# Patient Record
Sex: Female | Born: 1970 | Race: White | Hispanic: No | Smoking: Current every day smoker
Health system: Southern US, Community
[De-identification: ages and names within clinical notes are randomized; demographics above are authoritative.]

## PROBLEM LIST (undated history)

## (undated) DIAGNOSIS — Z22322 Carrier or suspected carrier of Methicillin resistant Staphylococcus aureus: Secondary | ICD-10-CM

## (undated) DIAGNOSIS — J189 Pneumonia, unspecified organism: Secondary | ICD-10-CM

## (undated) DIAGNOSIS — I10 Essential (primary) hypertension: Secondary | ICD-10-CM

## (undated) DIAGNOSIS — I2510 Atherosclerotic heart disease of native coronary artery without angina pectoris: Secondary | ICD-10-CM

## (undated) DIAGNOSIS — I82409 Acute embolism and thrombosis of unspecified deep veins of unspecified lower extremity: Secondary | ICD-10-CM

## (undated) DIAGNOSIS — Z9981 Dependence on supplemental oxygen: Secondary | ICD-10-CM

## (undated) DIAGNOSIS — R0602 Shortness of breath: Secondary | ICD-10-CM

## (undated) DIAGNOSIS — K219 Gastro-esophageal reflux disease without esophagitis: Secondary | ICD-10-CM

## (undated) DIAGNOSIS — T4145XA Adverse effect of unspecified anesthetic, initial encounter: Secondary | ICD-10-CM

## (undated) DIAGNOSIS — A4902 Methicillin resistant Staphylococcus aureus infection, unspecified site: Secondary | ICD-10-CM

## (undated) DIAGNOSIS — I219 Acute myocardial infarction, unspecified: Secondary | ICD-10-CM

## (undated) DIAGNOSIS — F419 Anxiety disorder, unspecified: Secondary | ICD-10-CM

## (undated) DIAGNOSIS — Z9889 Other specified postprocedural states: Secondary | ICD-10-CM

## (undated) DIAGNOSIS — I2699 Other pulmonary embolism without acute cor pulmonale: Secondary | ICD-10-CM

## (undated) DIAGNOSIS — F32A Depression, unspecified: Secondary | ICD-10-CM

## (undated) DIAGNOSIS — Z91158 Patient's noncompliance with renal dialysis for other reason: Secondary | ICD-10-CM

## (undated) DIAGNOSIS — F329 Major depressive disorder, single episode, unspecified: Secondary | ICD-10-CM

## (undated) DIAGNOSIS — T8859XA Other complications of anesthesia, initial encounter: Secondary | ICD-10-CM

## (undated) DIAGNOSIS — I739 Peripheral vascular disease, unspecified: Secondary | ICD-10-CM

## (undated) DIAGNOSIS — F319 Bipolar disorder, unspecified: Secondary | ICD-10-CM

## (undated) DIAGNOSIS — R112 Nausea with vomiting, unspecified: Secondary | ICD-10-CM

## (undated) DIAGNOSIS — Z9115 Patient's noncompliance with renal dialysis: Secondary | ICD-10-CM

## (undated) DIAGNOSIS — K3184 Gastroparesis: Secondary | ICD-10-CM

## (undated) DIAGNOSIS — J069 Acute upper respiratory infection, unspecified: Secondary | ICD-10-CM

## (undated) DIAGNOSIS — E785 Hyperlipidemia, unspecified: Secondary | ICD-10-CM

## (undated) DIAGNOSIS — Z992 Dependence on renal dialysis: Secondary | ICD-10-CM

## (undated) DIAGNOSIS — L0390 Cellulitis, unspecified: Secondary | ICD-10-CM

## (undated) DIAGNOSIS — N19 Unspecified kidney failure: Secondary | ICD-10-CM

## (undated) DIAGNOSIS — D649 Anemia, unspecified: Secondary | ICD-10-CM

## (undated) DIAGNOSIS — G4730 Sleep apnea, unspecified: Secondary | ICD-10-CM

## (undated) HISTORY — DX: Atherosclerotic heart disease of native coronary artery without angina pectoris: I25.10

## (undated) HISTORY — DX: Anemia, unspecified: D64.9

## (undated) HISTORY — PX: INCISE AND DRAIN ABCESS: PRO64

## (undated) HISTORY — DX: Other pulmonary embolism without acute cor pulmonale: I26.99

## (undated) HISTORY — PX: CHOLECYSTECTOMY: SHX55

## (undated) HISTORY — DX: Anxiety disorder, unspecified: F41.9

## (undated) HISTORY — DX: Hyperlipidemia, unspecified: E78.5

## (undated) HISTORY — DX: Cellulitis, unspecified: L03.90

## (undated) HISTORY — DX: Carrier or suspected carrier of methicillin resistant Staphylococcus aureus: Z22.322

## (undated) HISTORY — PX: EYE SURGERY: SHX253

## (undated) HISTORY — DX: Bipolar disorder, unspecified: F31.9

## (undated) HISTORY — PX: OTHER SURGICAL HISTORY: SHX169

## (undated) HISTORY — DX: Depression, unspecified: F32.A

## (undated) HISTORY — DX: Unspecified kidney failure: N19

## (undated) HISTORY — PX: CARDIAC CATHETERIZATION: SHX172

## (undated) HISTORY — DX: Acute embolism and thrombosis of unspecified deep veins of unspecified lower extremity: I82.409

## (undated) HISTORY — PX: BACK SURGERY: SHX140

## (undated) HISTORY — DX: Major depressive disorder, single episode, unspecified: F32.9

## (undated) HISTORY — DX: Gastroparesis: K31.84

## (undated) HISTORY — DX: Dependence on renal dialysis: Z99.2

---

## 1998-08-27 ENCOUNTER — Emergency Department (HOSPITAL_COMMUNITY): Admission: EM | Admit: 1998-08-27 | Discharge: 1998-08-27 | Payer: Self-pay | Admitting: Emergency Medicine

## 2000-01-25 ENCOUNTER — Encounter: Admission: RE | Admit: 2000-01-25 | Discharge: 2000-01-25 | Payer: Self-pay | Admitting: *Deleted

## 2000-04-12 ENCOUNTER — Ambulatory Visit (HOSPITAL_COMMUNITY): Admission: RE | Admit: 2000-04-12 | Discharge: 2000-04-12 | Payer: Self-pay | Admitting: Neurosurgery

## 2000-04-23 ENCOUNTER — Encounter: Payer: Self-pay | Admitting: Neurosurgery

## 2000-04-23 ENCOUNTER — Ambulatory Visit (HOSPITAL_COMMUNITY): Admission: RE | Admit: 2000-04-23 | Discharge: 2000-04-23 | Payer: Self-pay | Admitting: Neurosurgery

## 2000-05-15 ENCOUNTER — Encounter: Payer: Self-pay | Admitting: Neurosurgery

## 2000-05-17 ENCOUNTER — Encounter: Payer: Self-pay | Admitting: Neurosurgery

## 2000-05-17 ENCOUNTER — Inpatient Hospital Stay (HOSPITAL_COMMUNITY): Admission: RE | Admit: 2000-05-17 | Discharge: 2000-05-20 | Payer: Self-pay | Admitting: Neurosurgery

## 2000-05-21 ENCOUNTER — Emergency Department (HOSPITAL_COMMUNITY): Admission: EM | Admit: 2000-05-21 | Discharge: 2000-05-21 | Payer: Self-pay | Admitting: Emergency Medicine

## 2000-06-06 ENCOUNTER — Ambulatory Visit (HOSPITAL_COMMUNITY): Admission: RE | Admit: 2000-06-06 | Discharge: 2000-06-06 | Payer: Self-pay | Admitting: Neurosurgery

## 2000-06-12 ENCOUNTER — Encounter: Admission: RE | Admit: 2000-06-12 | Discharge: 2000-06-12 | Payer: Self-pay | Admitting: Neurosurgery

## 2000-06-12 ENCOUNTER — Encounter: Payer: Self-pay | Admitting: Neurosurgery

## 2000-07-15 ENCOUNTER — Encounter: Admission: RE | Admit: 2000-07-15 | Discharge: 2000-07-15 | Payer: Self-pay | Admitting: Neurosurgery

## 2000-07-15 ENCOUNTER — Encounter: Payer: Self-pay | Admitting: Neurosurgery

## 2000-09-02 ENCOUNTER — Encounter: Admission: RE | Admit: 2000-09-02 | Discharge: 2000-09-02 | Payer: Self-pay | Admitting: Neurosurgery

## 2000-09-02 ENCOUNTER — Encounter: Payer: Self-pay | Admitting: Neurosurgery

## 2001-04-09 ENCOUNTER — Encounter: Payer: Self-pay | Admitting: Neurological Surgery

## 2001-04-09 ENCOUNTER — Ambulatory Visit (HOSPITAL_COMMUNITY): Admission: RE | Admit: 2001-04-09 | Discharge: 2001-04-09 | Payer: Self-pay | Admitting: Neurological Surgery

## 2001-04-15 ENCOUNTER — Encounter: Payer: Self-pay | Admitting: Neurological Surgery

## 2001-04-15 ENCOUNTER — Encounter: Admission: RE | Admit: 2001-04-15 | Discharge: 2001-04-15 | Payer: Self-pay | Admitting: Neurological Surgery

## 2001-04-27 ENCOUNTER — Emergency Department (HOSPITAL_COMMUNITY): Admission: EM | Admit: 2001-04-27 | Discharge: 2001-04-28 | Payer: Self-pay | Admitting: *Deleted

## 2001-07-06 ENCOUNTER — Emergency Department (HOSPITAL_COMMUNITY): Admission: EM | Admit: 2001-07-06 | Discharge: 2001-07-06 | Payer: Self-pay | Admitting: *Deleted

## 2001-07-08 ENCOUNTER — Emergency Department (HOSPITAL_COMMUNITY): Admission: EM | Admit: 2001-07-08 | Discharge: 2001-07-08 | Payer: Self-pay | Admitting: Internal Medicine

## 2001-07-08 ENCOUNTER — Encounter: Payer: Self-pay | Admitting: Internal Medicine

## 2001-07-10 ENCOUNTER — Encounter: Payer: Self-pay | Admitting: Family Medicine

## 2001-07-10 ENCOUNTER — Ambulatory Visit (HOSPITAL_COMMUNITY): Admission: RE | Admit: 2001-07-10 | Discharge: 2001-07-10 | Payer: Self-pay | Admitting: Family Medicine

## 2001-07-16 ENCOUNTER — Encounter: Admission: RE | Admit: 2001-07-16 | Discharge: 2001-07-16 | Payer: Self-pay | Admitting: Neurological Surgery

## 2001-07-16 ENCOUNTER — Encounter: Payer: Self-pay | Admitting: Neurological Surgery

## 2001-08-26 ENCOUNTER — Encounter: Payer: Self-pay | Admitting: Emergency Medicine

## 2001-08-26 ENCOUNTER — Emergency Department (HOSPITAL_COMMUNITY): Admission: EM | Admit: 2001-08-26 | Discharge: 2001-08-26 | Payer: Self-pay | Admitting: Emergency Medicine

## 2001-08-27 HISTORY — PX: PORTACATH PLACEMENT: SHX2246

## 2001-09-06 ENCOUNTER — Encounter: Payer: Self-pay | Admitting: Emergency Medicine

## 2001-09-06 ENCOUNTER — Inpatient Hospital Stay (HOSPITAL_COMMUNITY): Admission: EM | Admit: 2001-09-06 | Discharge: 2001-09-08 | Payer: Self-pay | Admitting: Emergency Medicine

## 2001-10-01 ENCOUNTER — Emergency Department (HOSPITAL_COMMUNITY): Admission: EM | Admit: 2001-10-01 | Discharge: 2001-10-01 | Payer: Self-pay | Admitting: *Deleted

## 2001-11-02 ENCOUNTER — Encounter: Payer: Self-pay | Admitting: Emergency Medicine

## 2001-11-02 ENCOUNTER — Emergency Department (HOSPITAL_COMMUNITY): Admission: EM | Admit: 2001-11-02 | Discharge: 2001-11-02 | Payer: Self-pay | Admitting: Emergency Medicine

## 2001-11-27 ENCOUNTER — Encounter: Payer: Self-pay | Admitting: Emergency Medicine

## 2001-11-27 ENCOUNTER — Emergency Department (HOSPITAL_COMMUNITY): Admission: EM | Admit: 2001-11-27 | Discharge: 2001-11-27 | Payer: Self-pay | Admitting: Emergency Medicine

## 2002-01-10 ENCOUNTER — Encounter: Payer: Self-pay | Admitting: *Deleted

## 2002-01-10 ENCOUNTER — Emergency Department (HOSPITAL_COMMUNITY): Admission: EM | Admit: 2002-01-10 | Discharge: 2002-01-10 | Payer: Self-pay | Admitting: *Deleted

## 2002-01-12 ENCOUNTER — Emergency Department (HOSPITAL_COMMUNITY): Admission: EM | Admit: 2002-01-12 | Discharge: 2002-01-12 | Payer: Self-pay | Admitting: Emergency Medicine

## 2002-01-15 ENCOUNTER — Emergency Department (HOSPITAL_COMMUNITY): Admission: EM | Admit: 2002-01-15 | Discharge: 2002-01-15 | Payer: Self-pay | Admitting: Emergency Medicine

## 2002-01-29 ENCOUNTER — Emergency Department (HOSPITAL_COMMUNITY): Admission: EM | Admit: 2002-01-29 | Discharge: 2002-01-29 | Payer: Self-pay | Admitting: *Deleted

## 2002-02-18 ENCOUNTER — Ambulatory Visit (HOSPITAL_COMMUNITY): Admission: RE | Admit: 2002-02-18 | Discharge: 2002-02-18 | Payer: Self-pay | Admitting: General Surgery

## 2002-03-15 ENCOUNTER — Encounter: Payer: Self-pay | Admitting: Emergency Medicine

## 2002-03-15 ENCOUNTER — Inpatient Hospital Stay (HOSPITAL_COMMUNITY): Admission: EM | Admit: 2002-03-15 | Discharge: 2002-03-21 | Payer: Self-pay | Admitting: Emergency Medicine

## 2002-03-27 ENCOUNTER — Encounter: Payer: Self-pay | Admitting: *Deleted

## 2002-03-27 ENCOUNTER — Emergency Department (HOSPITAL_COMMUNITY): Admission: EM | Admit: 2002-03-27 | Discharge: 2002-03-27 | Payer: Self-pay | Admitting: *Deleted

## 2002-04-09 ENCOUNTER — Inpatient Hospital Stay (HOSPITAL_COMMUNITY): Admission: AD | Admit: 2002-04-09 | Discharge: 2002-04-14 | Payer: Self-pay | Admitting: General Surgery

## 2002-05-14 ENCOUNTER — Emergency Department (HOSPITAL_COMMUNITY): Admission: EM | Admit: 2002-05-14 | Discharge: 2002-05-15 | Payer: Self-pay | Admitting: *Deleted

## 2002-05-14 ENCOUNTER — Encounter: Payer: Self-pay | Admitting: *Deleted

## 2003-05-12 IMAGING — CT CT PELVIS W/ CM
1 of 3 series · 14 of 32 positions shown, 19 images · IV contrast (CONTRAST)
Comparison: none

FINDINGS
CLINICAL DATA: VOMITING.  EPIGASTRIC PAIN.
CT OF THE ABDOMEN WITH IV CONTRAST
SCANS WERE OBTAINED DURING IV CONTRAST ENHANCEMENT WITH 100 CC OF OMNIPAQUE 300 INJECTED AT 2 CC
PER SECOND.
THE LIVER IS DIFFUSELY HYPODENSE CONSISTENT WITH FATTY CHANGE.  THERE ARE NO FOCAL HEPATIC
ABNORMALITIES.  THE SPLEEN, PANCREAS, AND KIDNEYS HAVE A NORMAL APPEARANCE.  THE LEFT ADRENAL GLAND
IS ENLARGED AND CONTAINS A 1.2 CM IN SIZE FAT CONTAINING NODULE  (IMAGE SEQUENCE #25) CONSISTENT
WITH A SMALL ADRENAL MYELOLIPOMA.  THERE IS A SECOND NODULE WHICH MEASURES 1.4 CM IN DIAMETER
LOCATED WITHIN THE ANTERIOR PORTION OF THE LEFT ADRENAL GLAND WHICH MEASURES IN THE 45-50
[place] UNIT RANGE AND IS AN INDETERMINATE NODULE.  THE RIGHT ADRENAL GLAND IS SOMEWHAT
PROMINENT IN SIZE WITH SLIGHT NODULARITY WITHIN ITS INFERIOR PORTION.  A DEDICATED CT OF THE
ADRENAL GLANDS WITHOUT IV CONTRAST AND FOLLOWED BY IV CONTRAST IF NEEDED MAY BE HELPFUL FOR FURTHER
EVALUATION OF THESE LESIONS.
THERE HAS BEEN A PREVIOUS LUMBAR FUSION.  THE ABDOMINAL AORTA IS NORMAL IN SIZE.  THE APPENDIX IS
VISUALIZED AND HAS A NORMAL APPEARANCE.
IMPRESSION
1.   TWO LEFT ADRENAL NODULES.  ONE OF THE NODULES IS 1.2 CM IN SIZE AND IS CONSISTENT WITH A
BENIGN MYELOLIPOMA.   THE SECOND NODULE IS 1.4 CM IN SIZE AND IS INDETERMINATE.  THERE IS ALSO
QUESTIONABLE SLIGHT NODULARITY ASSOCIATED WITH THE RIGHT ADRENAL GLAND AS DISCUSSED ABOVE.  AS A
RESULT THIN SECTION CT OF THE ADRENALS WITHOUT AND WITH IV CONTRAST IF NEEDED MAY BE HELPFUL IN
FURTHER EVALUATION.
2.   DIFFUSE FATTY CHANGES NOTED WITHIN THE LIVER.  OTHERWISE NEGATIVE CT OF THE ABDOMEN.
CT OF THE PELVIS WITH IV CONTRAST
THERE IS NO EVIDENCE FOR PELVIC MASS OR ADENOPATHY AND THERE IS NO FREE PELVIC FLUID.  THE APPENDIX
IS VISUALIZED AND HAS A NORMAL APPEARANCE.
NEGATIVE CT OF THE PELVIS.

[Series 3682: — · axial · 0.65mm/px · z∈[+1312,+1752]mm · 14 of 100 slices shown, 19 images]
[im 6/100  soft-tissue]
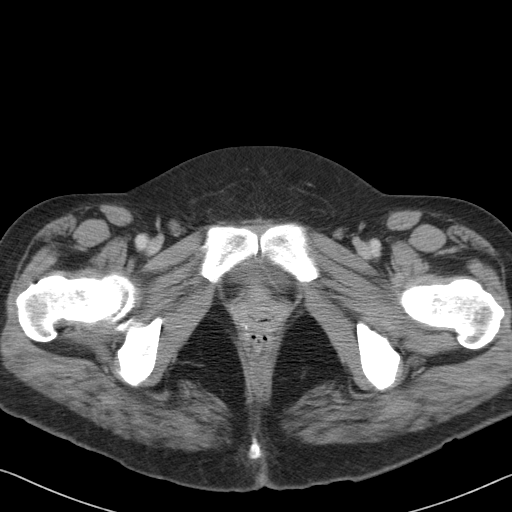
[im 6/100  bone]
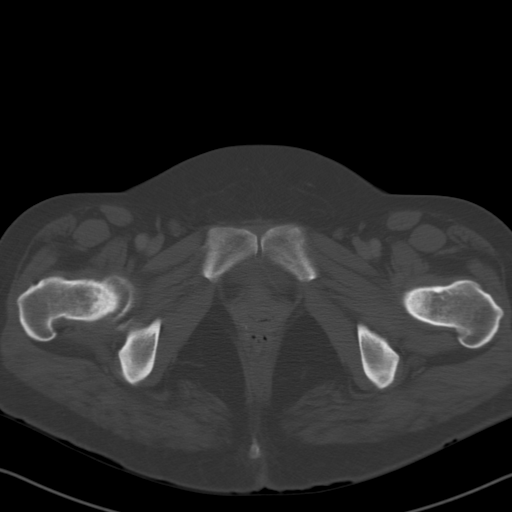
[im 16/100  soft-tissue]
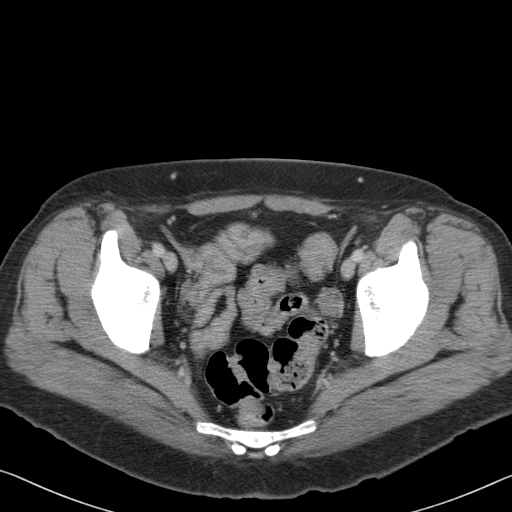
[im 21/100  soft-tissue]
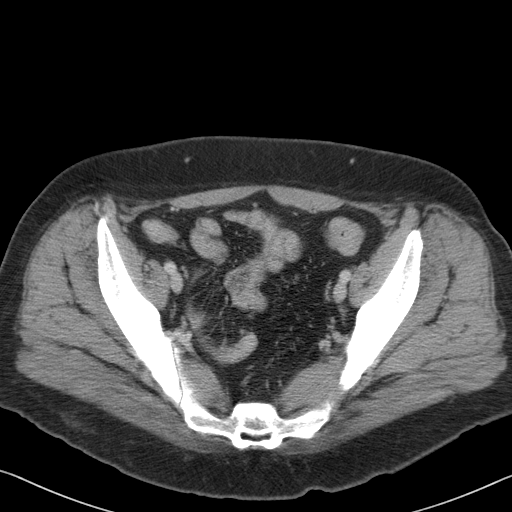
[im 27/100  soft-tissue]
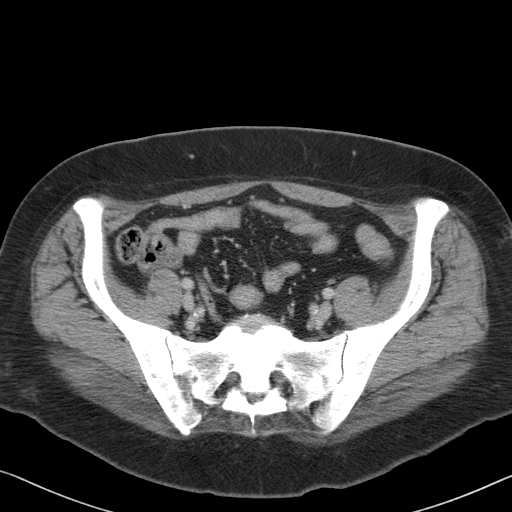
[im 37/100  soft-tissue]
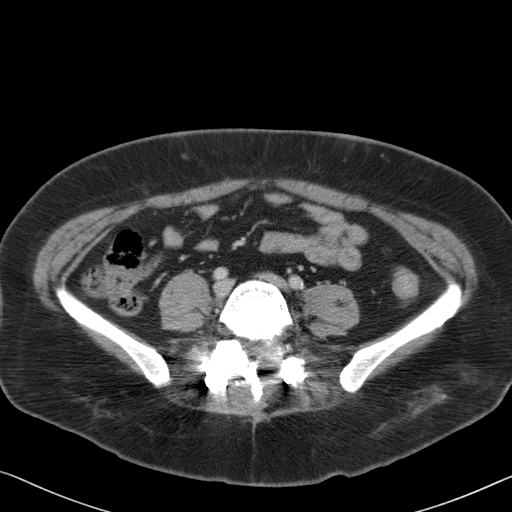
[im 42/100  soft-tissue]
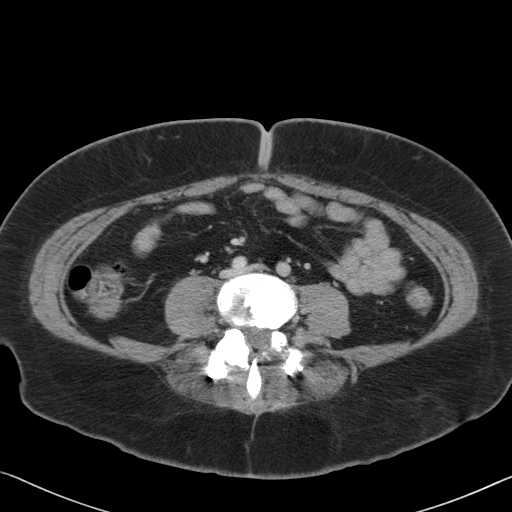
[im 53/100  soft-tissue]
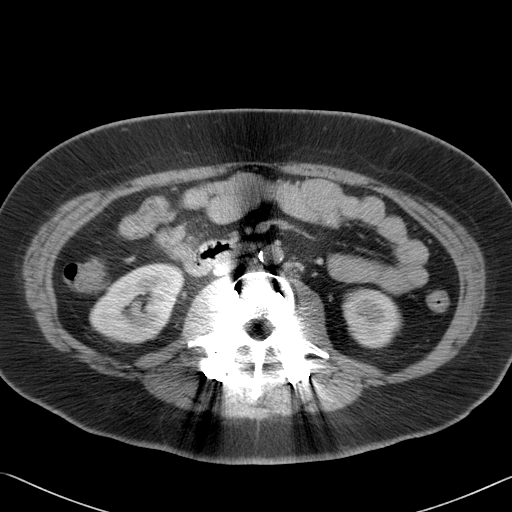
[im 58/100  soft-tissue]
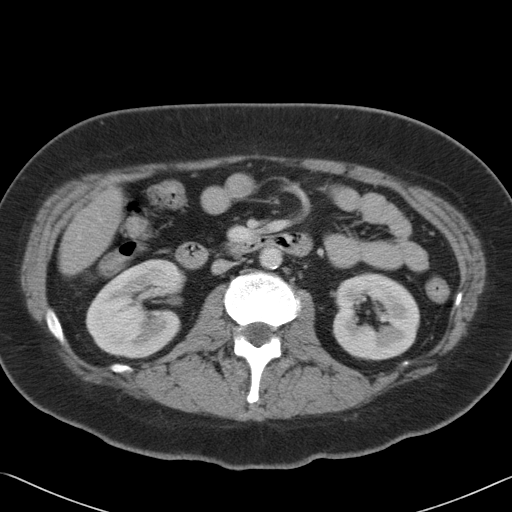
[im 63/100  soft-tissue]
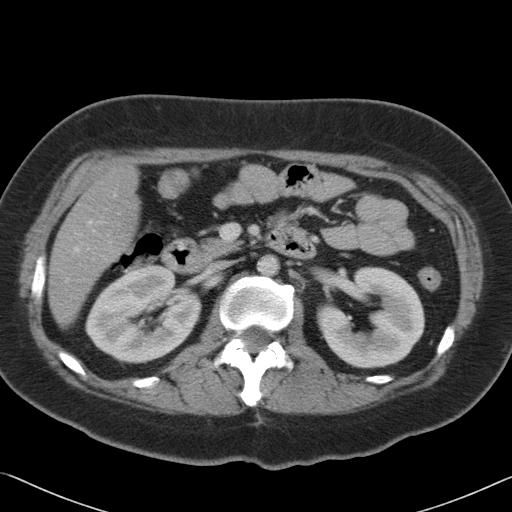
[im 63/100  bone]
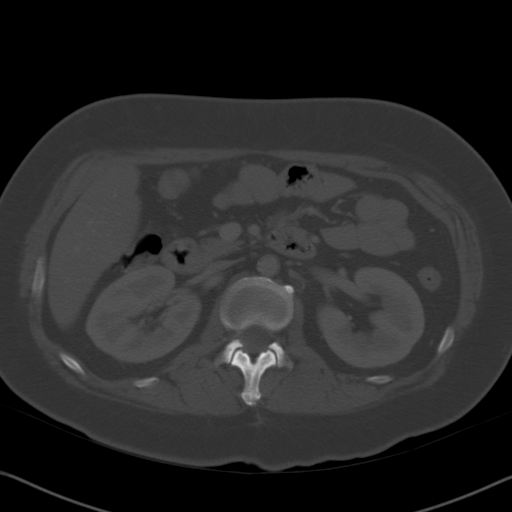
[im 73/100  soft-tissue]
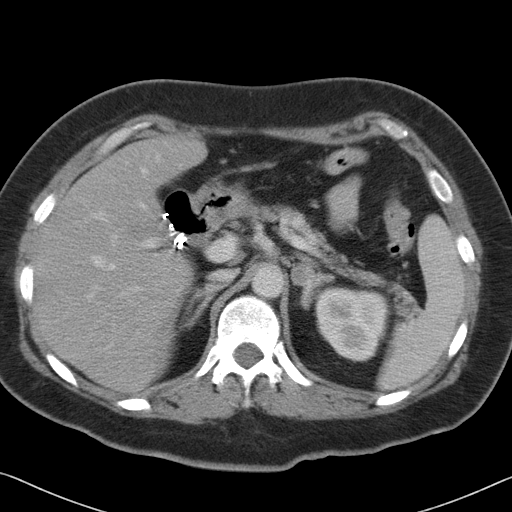
[im 79/100  soft-tissue]
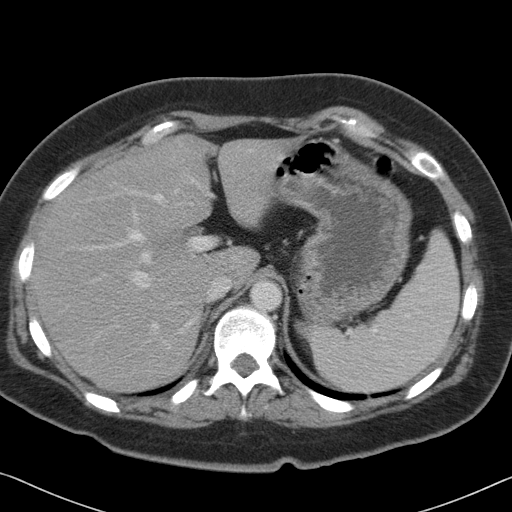
[im 79/100  lung]
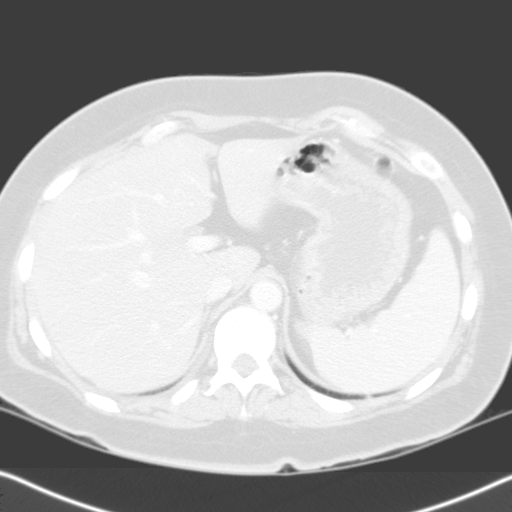
[im 84/100  soft-tissue]
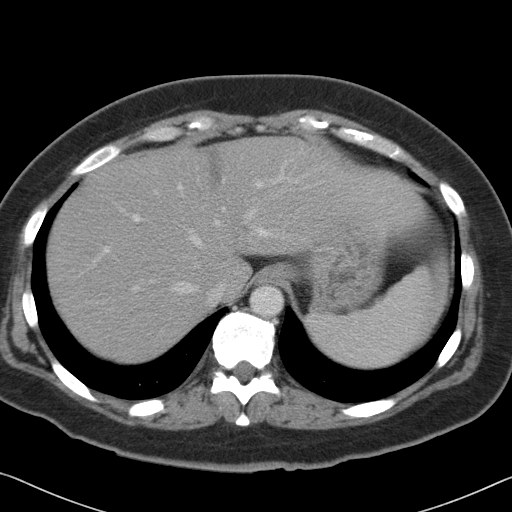
[im 84/100  lung]
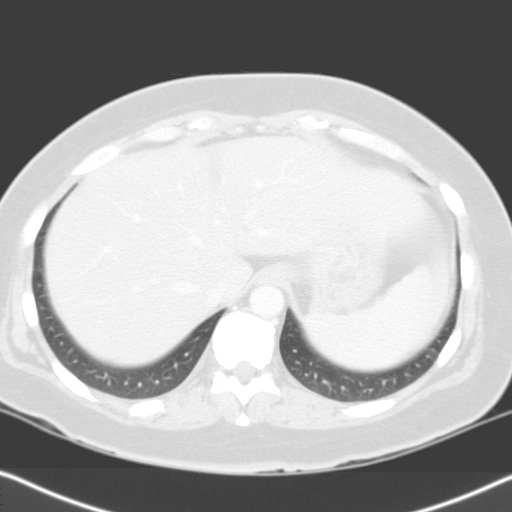
[im 89/100  lung]
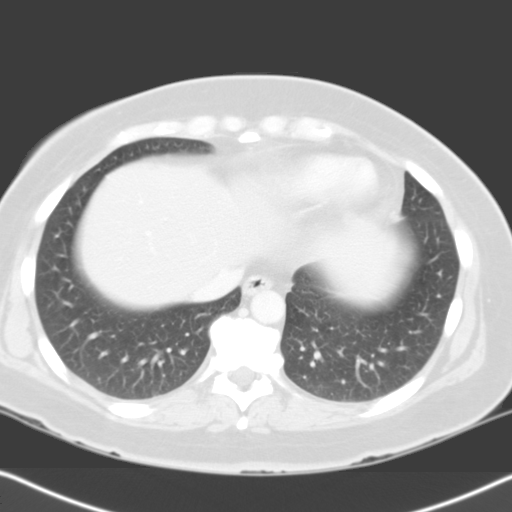
[im 94/100  soft-tissue]
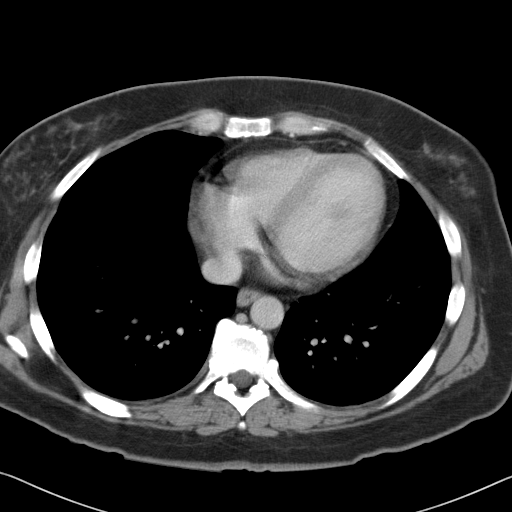
[im 94/100  lung]
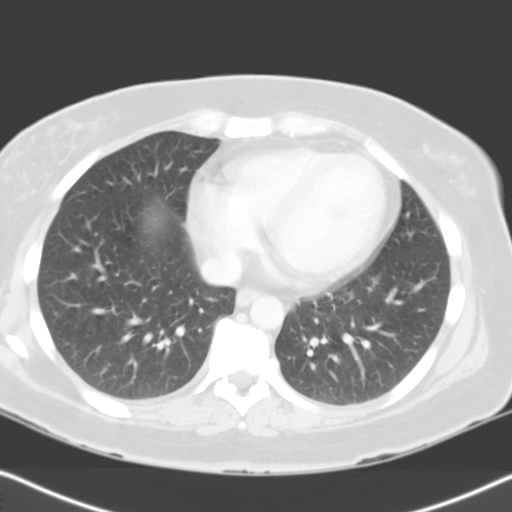

[14 of 32 positions shown; findings below may reference images not displayed]

## 2003-07-09 ENCOUNTER — Encounter (HOSPITAL_COMMUNITY): Admission: RE | Admit: 2003-07-09 | Discharge: 2003-08-08 | Payer: Self-pay | Admitting: Internal Medicine

## 2003-10-05 ENCOUNTER — Inpatient Hospital Stay (HOSPITAL_COMMUNITY): Admission: EM | Admit: 2003-10-05 | Discharge: 2003-10-07 | Payer: Self-pay | Admitting: Emergency Medicine

## 2003-10-08 ENCOUNTER — Emergency Department (HOSPITAL_COMMUNITY): Admission: EM | Admit: 2003-10-08 | Discharge: 2003-10-08 | Payer: Self-pay | Admitting: Emergency Medicine

## 2003-10-11 ENCOUNTER — Encounter: Admission: RE | Admit: 2003-10-11 | Discharge: 2003-10-11 | Payer: Self-pay | Admitting: Neurosurgery

## 2003-11-24 ENCOUNTER — Encounter (HOSPITAL_COMMUNITY): Admission: RE | Admit: 2003-11-24 | Discharge: 2003-12-24 | Payer: Self-pay | Admitting: Internal Medicine

## 2003-12-03 ENCOUNTER — Inpatient Hospital Stay (HOSPITAL_COMMUNITY): Admission: EM | Admit: 2003-12-03 | Discharge: 2003-12-04 | Payer: Self-pay | Admitting: Emergency Medicine

## 2003-12-13 ENCOUNTER — Inpatient Hospital Stay (HOSPITAL_COMMUNITY): Admission: EM | Admit: 2003-12-13 | Discharge: 2003-12-15 | Payer: Self-pay | Admitting: Emergency Medicine

## 2003-12-16 ENCOUNTER — Inpatient Hospital Stay (HOSPITAL_COMMUNITY): Admission: EM | Admit: 2003-12-16 | Discharge: 2003-12-18 | Payer: Self-pay | Admitting: Emergency Medicine

## 2004-01-02 ENCOUNTER — Inpatient Hospital Stay (HOSPITAL_COMMUNITY): Admission: EM | Admit: 2004-01-02 | Discharge: 2004-01-03 | Payer: Self-pay | Admitting: *Deleted

## 2004-01-04 ENCOUNTER — Inpatient Hospital Stay (HOSPITAL_COMMUNITY): Admission: EM | Admit: 2004-01-04 | Discharge: 2004-01-07 | Payer: Self-pay | Admitting: Emergency Medicine

## 2004-02-14 ENCOUNTER — Emergency Department (HOSPITAL_COMMUNITY): Admission: EM | Admit: 2004-02-14 | Discharge: 2004-02-14 | Payer: Self-pay | Admitting: Emergency Medicine

## 2004-03-05 ENCOUNTER — Emergency Department (HOSPITAL_COMMUNITY): Admission: EM | Admit: 2004-03-05 | Discharge: 2004-03-05 | Payer: Self-pay | Admitting: Emergency Medicine

## 2004-03-06 ENCOUNTER — Inpatient Hospital Stay (HOSPITAL_COMMUNITY): Admission: EM | Admit: 2004-03-06 | Discharge: 2004-03-09 | Payer: Self-pay | Admitting: Emergency Medicine

## 2004-05-29 ENCOUNTER — Inpatient Hospital Stay (HOSPITAL_COMMUNITY): Admission: EM | Admit: 2004-05-29 | Discharge: 2004-06-02 | Payer: Self-pay | Admitting: Emergency Medicine

## 2004-06-21 ENCOUNTER — Emergency Department (HOSPITAL_COMMUNITY): Admission: EM | Admit: 2004-06-21 | Discharge: 2004-06-22 | Payer: Self-pay | Admitting: *Deleted

## 2004-06-22 ENCOUNTER — Inpatient Hospital Stay (HOSPITAL_COMMUNITY): Admission: EM | Admit: 2004-06-22 | Discharge: 2004-06-25 | Payer: Self-pay | Admitting: Emergency Medicine

## 2004-06-22 ENCOUNTER — Ambulatory Visit: Payer: Self-pay | Admitting: Internal Medicine

## 2004-07-10 ENCOUNTER — Inpatient Hospital Stay (HOSPITAL_COMMUNITY): Admission: EM | Admit: 2004-07-10 | Discharge: 2004-07-13 | Payer: Self-pay | Admitting: Emergency Medicine

## 2004-07-15 ENCOUNTER — Inpatient Hospital Stay (HOSPITAL_COMMUNITY): Admission: EM | Admit: 2004-07-15 | Discharge: 2004-07-18 | Payer: Self-pay | Admitting: Emergency Medicine

## 2004-07-30 ENCOUNTER — Inpatient Hospital Stay (HOSPITAL_COMMUNITY): Admission: EM | Admit: 2004-07-30 | Discharge: 2004-08-03 | Payer: Self-pay | Admitting: Emergency Medicine

## 2004-08-04 ENCOUNTER — Emergency Department (HOSPITAL_COMMUNITY): Admission: EM | Admit: 2004-08-04 | Discharge: 2004-08-05 | Payer: Self-pay | Admitting: Emergency Medicine

## 2004-08-06 ENCOUNTER — Inpatient Hospital Stay (HOSPITAL_COMMUNITY): Admission: EM | Admit: 2004-08-06 | Discharge: 2004-08-12 | Payer: Self-pay | Admitting: Emergency Medicine

## 2004-08-24 ENCOUNTER — Encounter (HOSPITAL_COMMUNITY): Admission: RE | Admit: 2004-08-24 | Discharge: 2004-09-23 | Payer: Self-pay | Admitting: Oncology

## 2004-08-28 ENCOUNTER — Emergency Department (HOSPITAL_COMMUNITY): Admission: EM | Admit: 2004-08-28 | Discharge: 2004-08-29 | Payer: Self-pay | Admitting: *Deleted

## 2004-08-29 ENCOUNTER — Inpatient Hospital Stay (HOSPITAL_COMMUNITY): Admission: EM | Admit: 2004-08-29 | Discharge: 2004-09-02 | Payer: Self-pay | Admitting: Emergency Medicine

## 2004-09-04 ENCOUNTER — Ambulatory Visit (HOSPITAL_COMMUNITY): Payer: Self-pay | Admitting: Oncology

## 2004-09-06 ENCOUNTER — Inpatient Hospital Stay (HOSPITAL_COMMUNITY): Admission: EM | Admit: 2004-09-06 | Discharge: 2004-09-11 | Payer: Self-pay | Admitting: Emergency Medicine

## 2004-09-17 ENCOUNTER — Inpatient Hospital Stay (HOSPITAL_COMMUNITY): Admission: EM | Admit: 2004-09-17 | Discharge: 2004-09-27 | Payer: Self-pay | Admitting: Emergency Medicine

## 2004-09-17 ENCOUNTER — Ambulatory Visit: Payer: Self-pay | Admitting: Internal Medicine

## 2004-10-03 IMAGING — CT CT ABDOMEN W/ CM
1 of 3 series · 14 of 32 positions shown, 19 images · IV contrast (CONTRAST)
Comparison: none

CLINICAL DATA: Abdominal pain, nausea and vomiting.  
 CT ABDOMEN WITH CONTRAST  
 Multi detector helical CT imaging abdomen and pelvis performed following 150 cc Omnipaque 300.  Oral contrast not administered as patient indicates she is allergic to Gloria Angelica.  Non-ionic intravenous contrast indicated due to history of   coronary artery disease status post MI.  Comparison 05/14/02.
 Lung bases clear.  Cholecystectomy.  Three nodules identified, left adrenal gland.  Most superior nodule is 13 x 10 mm size (image #25) fatty attenuation, compatible with myelolipoma.  Second nodule is 14 x 16 mm size (image #28), indeterminate, but stable since previous study, suggesting benign etiology.  Tiny nodule noted inferior aspect of lateral limb of the left adrenal gland, 9 x 8 mm size, unchanged.  Additional diffuse nodularity of both adrenal glands, stable.  No focal abnormalities of the liver, spleen, pancreas or kidneys.  Atherosclerotic calcifications aorta.  Prior lumbar surgery with bilateral pedicle screws and bars at four levels.  Bowel loops unremarkable.  
 Tiny splenule noted inferior to the spleen.  
 IMPRESSION
 1.  Multiple left adrenal nodules including myelolipoma and two additional non specific nodules, which are stable, probably benign.  No acute abnormalities.  
 2. Cholecystectomy.
 3.  Atherosclerotic calcifications noted in aorta and iliac vessels, premature for age.  
 CT PELVIS WITH CONTRAST
 Small amount of non specific free pelvic fluid.  Uterus and ovaries unremarkable for age.  Pelvic bowel loops normal in appearance.  Appendix normal.  No mass, adenopathy or free fluid.  Small left pelvic phlebolith, stable.  
 Small amount of non specific free pelvic fluid.  Normal appendix.  No acute abnormalities.

[Series 4897: — · axial · 0.70mm/px · z∈[+1244,+1678]mm · 14 of 99 slices shown, 19 images]
[im 6/99  soft-tissue]
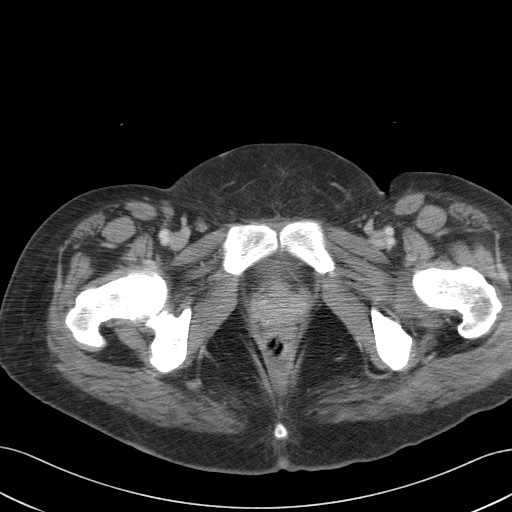
[im 6/99  bone]
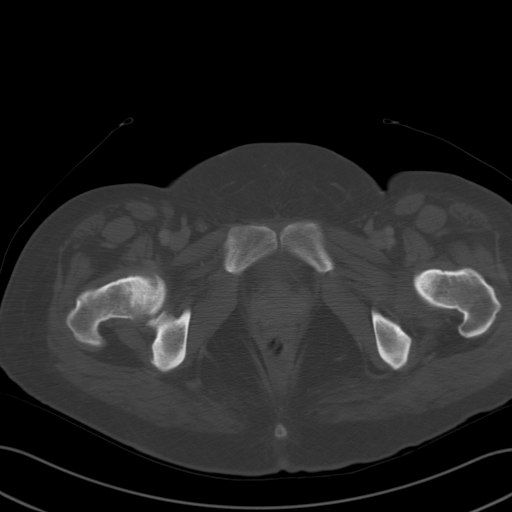
[im 12/99  soft-tissue]
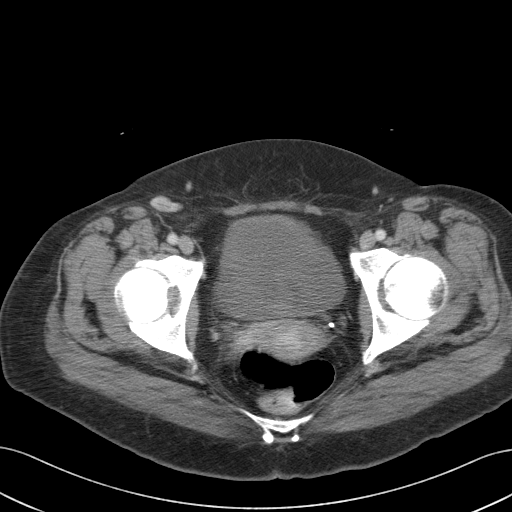
[im 24/99  soft-tissue]
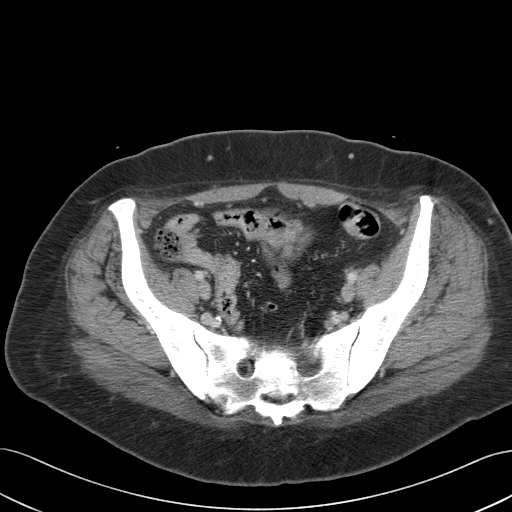
[im 29/99  soft-tissue]
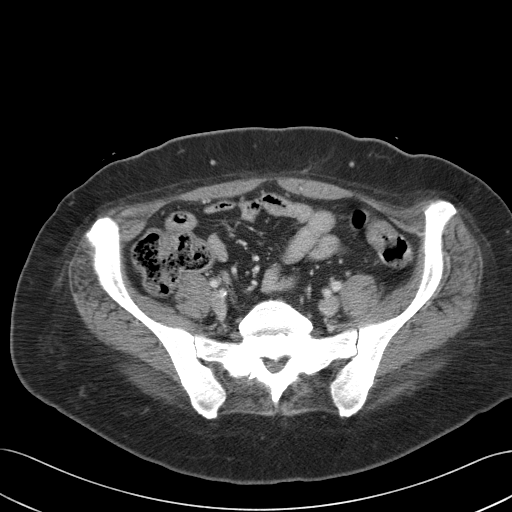
[im 35/99  soft-tissue]
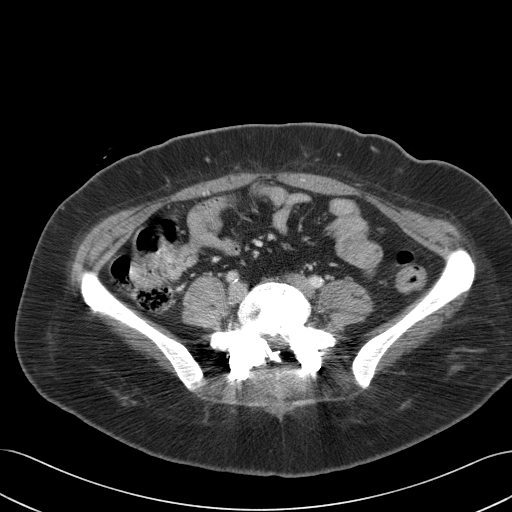
[im 41/99  soft-tissue]
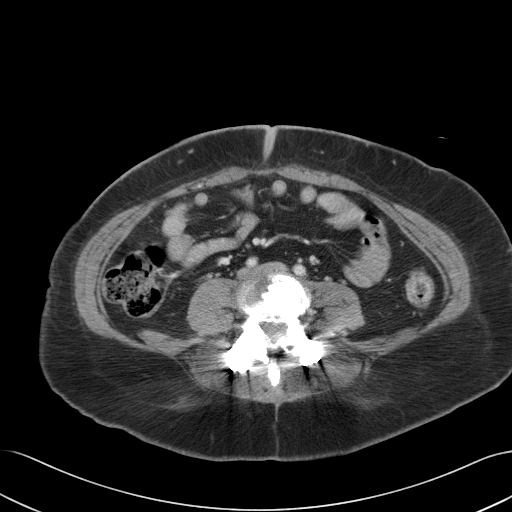
[im 52/99  soft-tissue]
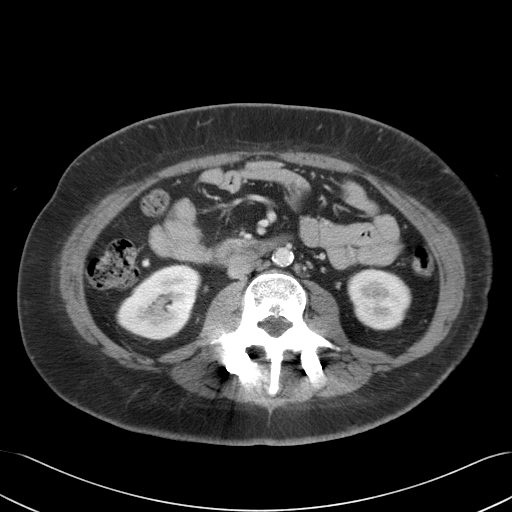
[im 58/99  soft-tissue]
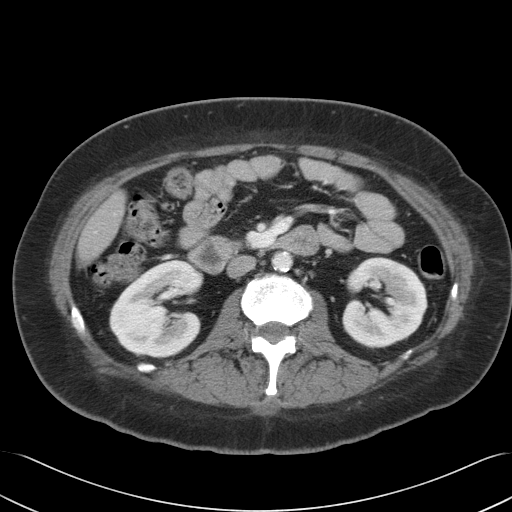
[im 64/99  soft-tissue]
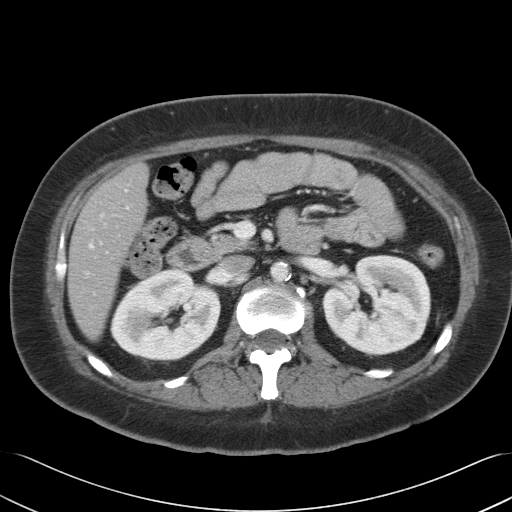
[im 64/99  bone]
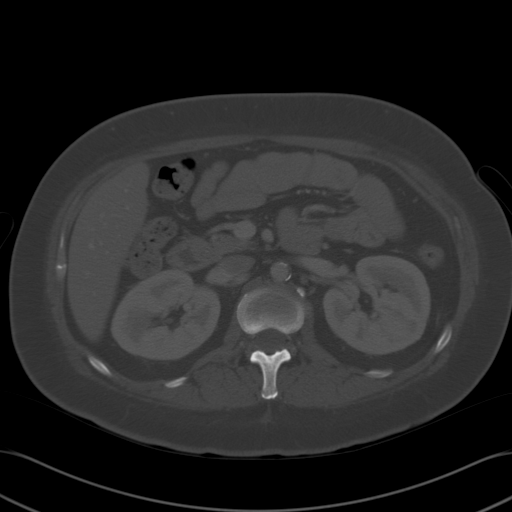
[im 70/99  soft-tissue]
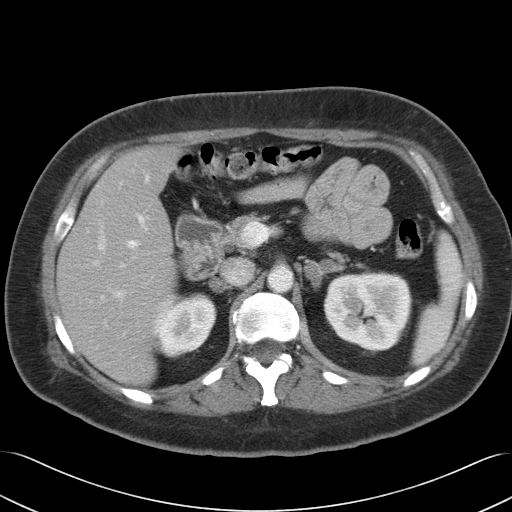
[im 75/99  soft-tissue]
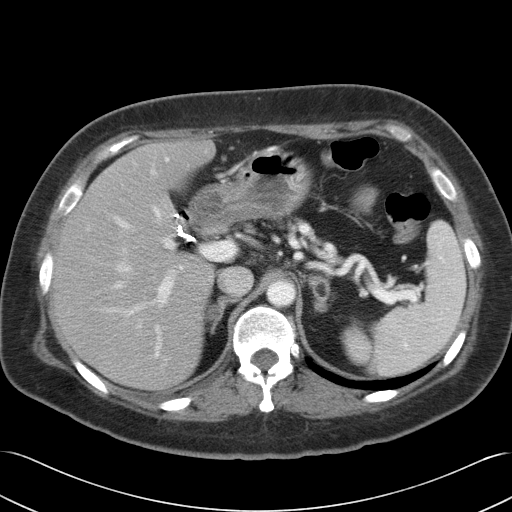
[im 75/99  lung]
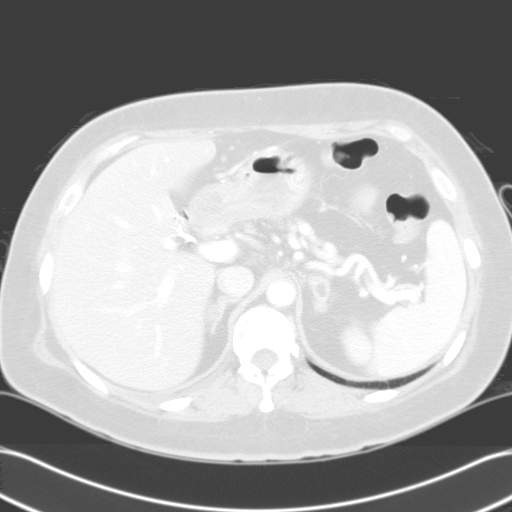
[im 81/99  lung]
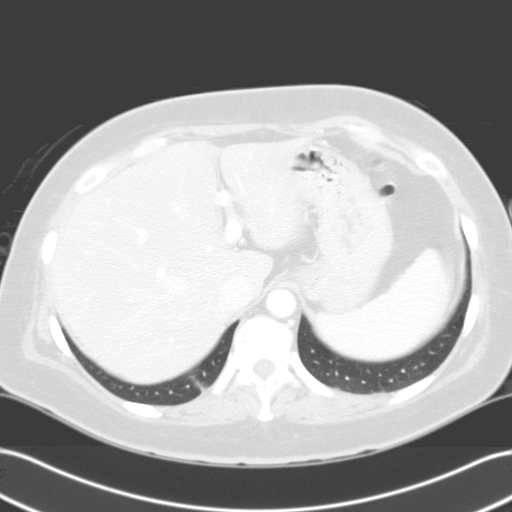
[im 87/99  soft-tissue]
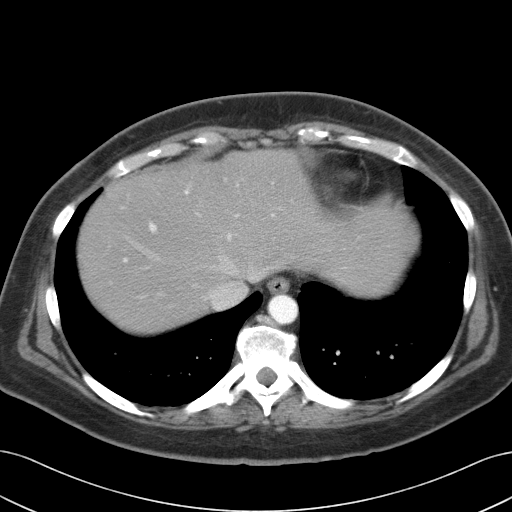
[im 87/99  lung]
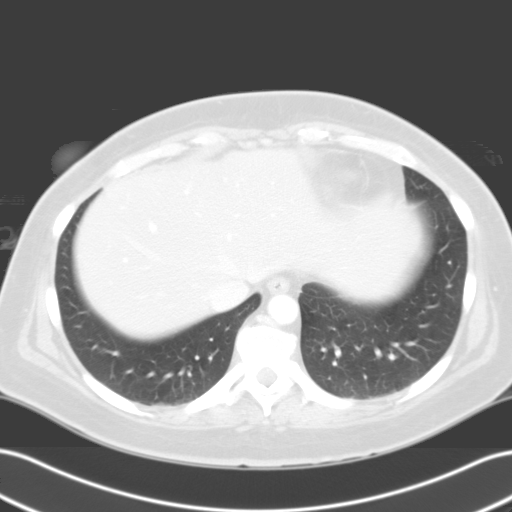
[im 93/99  soft-tissue]
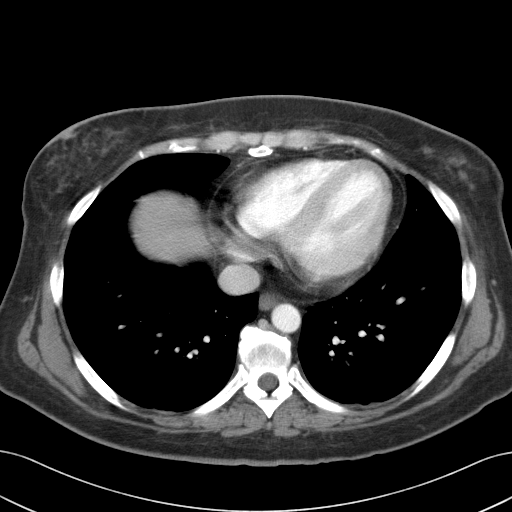
[im 93/99  lung]
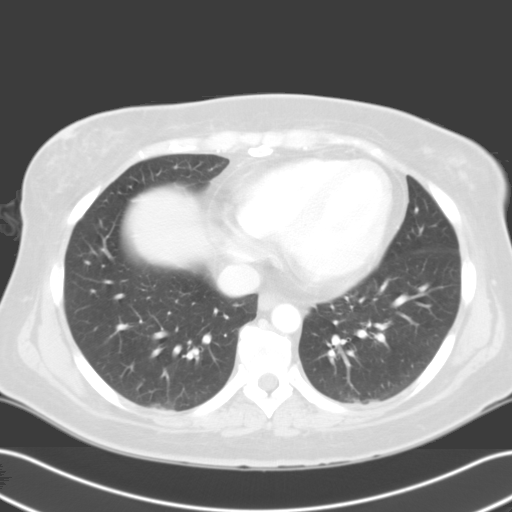

[14 of 32 positions shown; findings below may reference images not displayed]

## 2004-10-04 ENCOUNTER — Ambulatory Visit (HOSPITAL_COMMUNITY): Payer: Self-pay | Admitting: Family Medicine

## 2004-10-04 ENCOUNTER — Encounter (HOSPITAL_COMMUNITY): Admission: RE | Admit: 2004-10-04 | Discharge: 2004-11-03 | Payer: Self-pay | Admitting: Family Medicine

## 2004-10-08 IMAGING — CR DG MYELOGRAM LUMBAR
6 series · 6 of 6 positions shown · IV contrast (omnipaque)
Comparison: none

CLINICAL DATA: The patient has undergone a previous lumbar fusion.  She has developed right leg pain posterolaterally which extends into her foot.  She is referred for a lumbar myelogram.
 LUMBAR MYELOGRAM
 Following informed consent, I placed a 22 gauge spinal needle into the thecal sac centrally at the L2-3 disk level.  The needle was placed from a left posterolateral approach.  CSF is clear.  15 cc of Omnipaque 180 contrast was injected.  The needle was withdrawn.  Multiple images were obtained and supplemented with standing flexion and extension views. 
 The patient has undergone a previous lumbar fusion L[DATE] with posterior intrapedicular screw plate fixation.  The hardware is intact.  The alignment is stable through flexion and extension.  There is no appreciable central stenosis.  Shallow ventral defects are seen at the L1-2 and L2-3 levels.  On the AP and oblique projections, the right root sleeves fill normally without extradural defects or truncation.  Mild truncation of the left L4 root is appreciated.  The fusion is intact.  Stable through flexion and extension.  No extradural defects are demonstrated on the right to account for right sided symptoms.  CT to follow.
 CT LUMBAR SPINE POST INTRATHECAL CONTRAST INJECTION
 L1-2:  Facet degenerative change.  Subligamentous disk protrusion without central nor foraminal stenosis.
 L2-3:  Posterior fusion mass is appreciated.  The facet joints are fused.  Subligamentous disk protrusion.  No appreciable central nor foraminal stenosis.
 L3-4:  Right paracentral disk protrusion does indent the thecal sac.  The L3 root exits without encroachment.  There is no appreciable central stenosis.  A left hemilaminectomy defect is identified.  The facet joints are fused.  
 L4-5:  Pedicle screws are well-positioned.  The facet joints are fused.  The L4 roots exit without encroachment.  A left hemilaminectomy defect is appreciated.
 L5-S1:  Moderate multifactorial foraminal encroachment is seen bilaterally.  No appreciable encroachment upon the L5 roots.
 There is no central stenosis.  Posterior fusion mass is intact.
 IMPRESSION
 1.  L3-4 right paracentral subligamentous disk protrusion is identified.  This does indent the thecal sac mildly without appreciable central stenosis.  No significant foraminal encroachment nor encroachment upon the exiting L3 roots.
 2.  Mild to moderate multifactorial foraminal encroachment bilaterally without encroachment upon the exiting L5 root.
 CT MULTIPLANAR REFORMATTED IMAGING
 Sagittal and coronal projections:
 Posterior fusion hardware extends into the L2, L3, L4 and L5 pedicles bilaterally.  The hardware is intact. The pedicle screws are well-positioned.  Interbody bone plugs are seen at L3-4, L4-5.  The nerve roots exit bilaterally without evidence for encroachment.  No appreciable central stenosis.  The prevertebral soft tissues are unremarkable.  Atherosclerotic changes are noted within the aorta. 
 Pedicle screws and hardware are intact and well-positioned.  No appreciable central nor foraminal stenosis.

[view not recorded (1 of 6)]
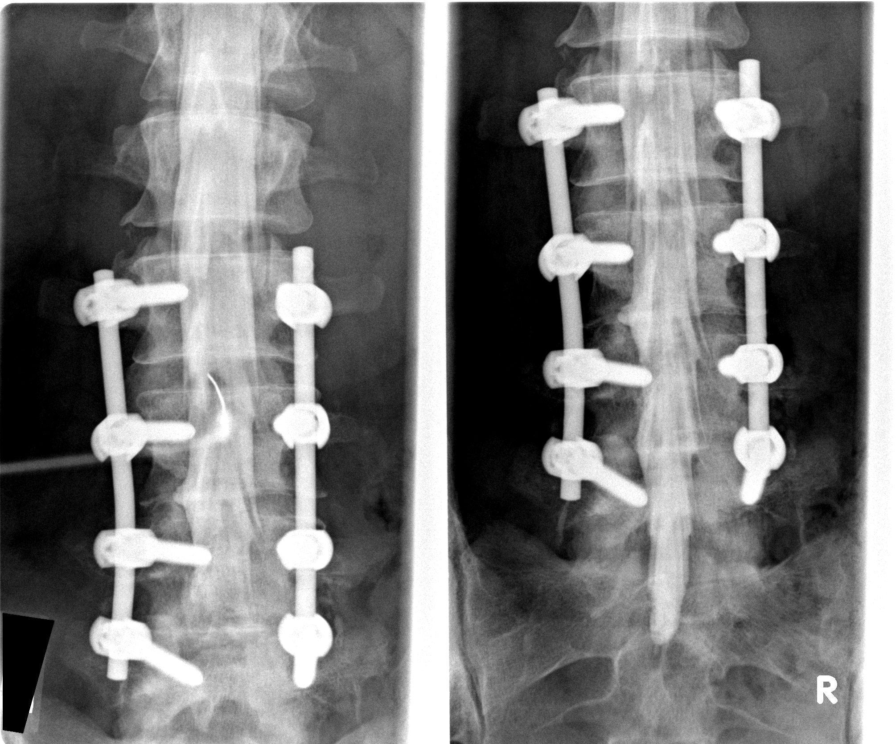

[view not recorded (2 of 6)]
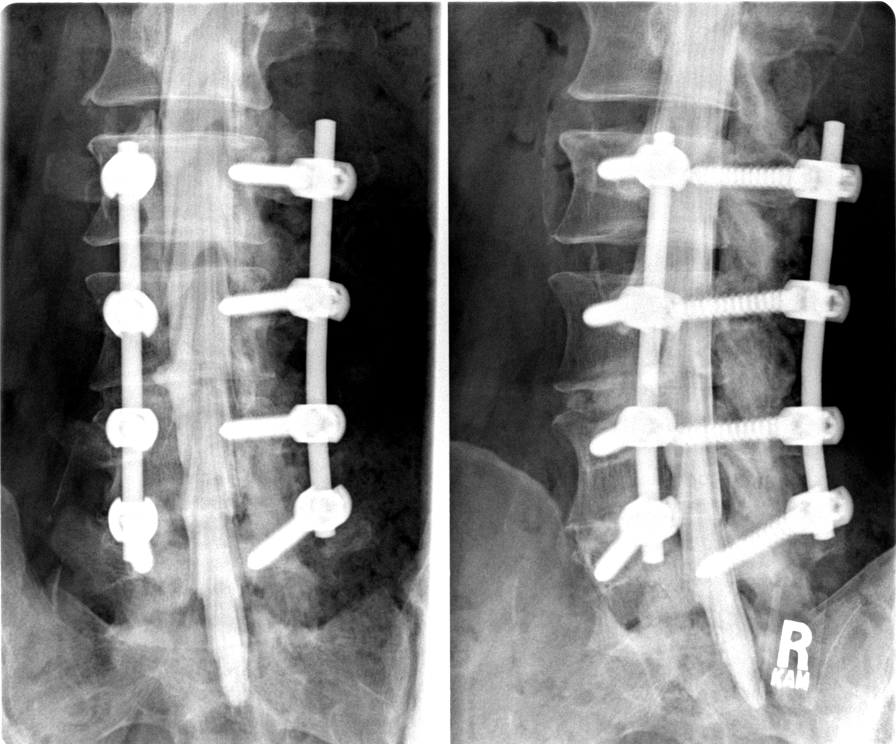

[view not recorded (3 of 6)]
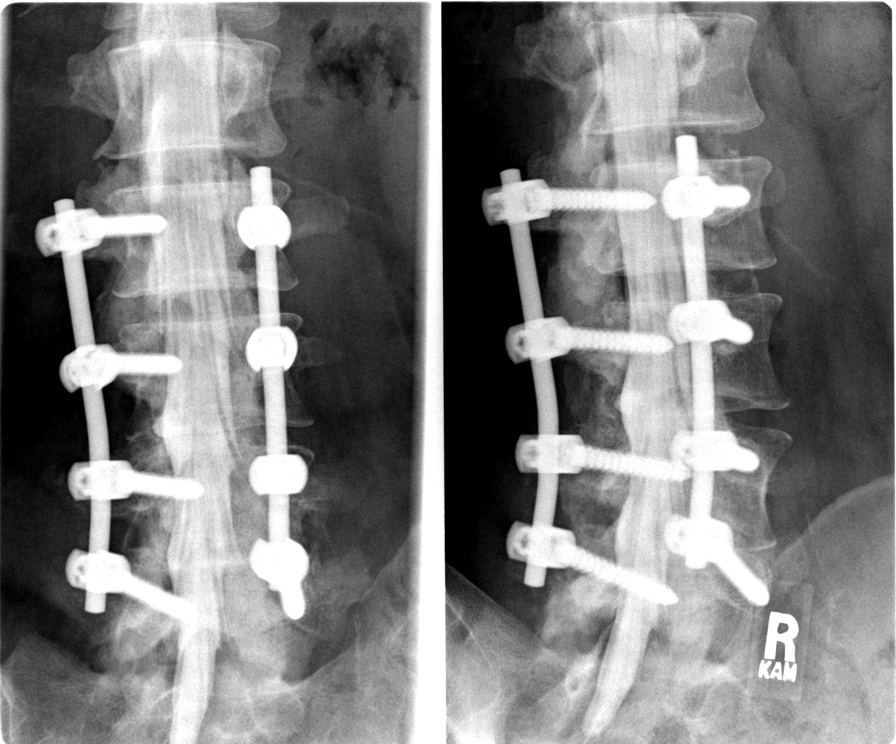

[view not recorded (4 of 6)]
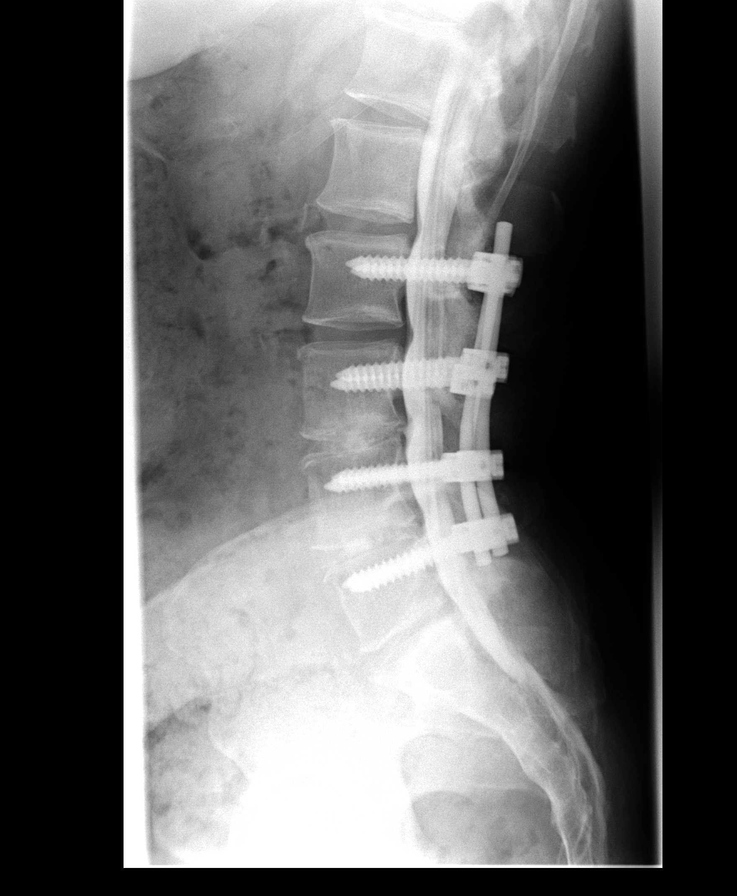

[view not recorded (5 of 6)]
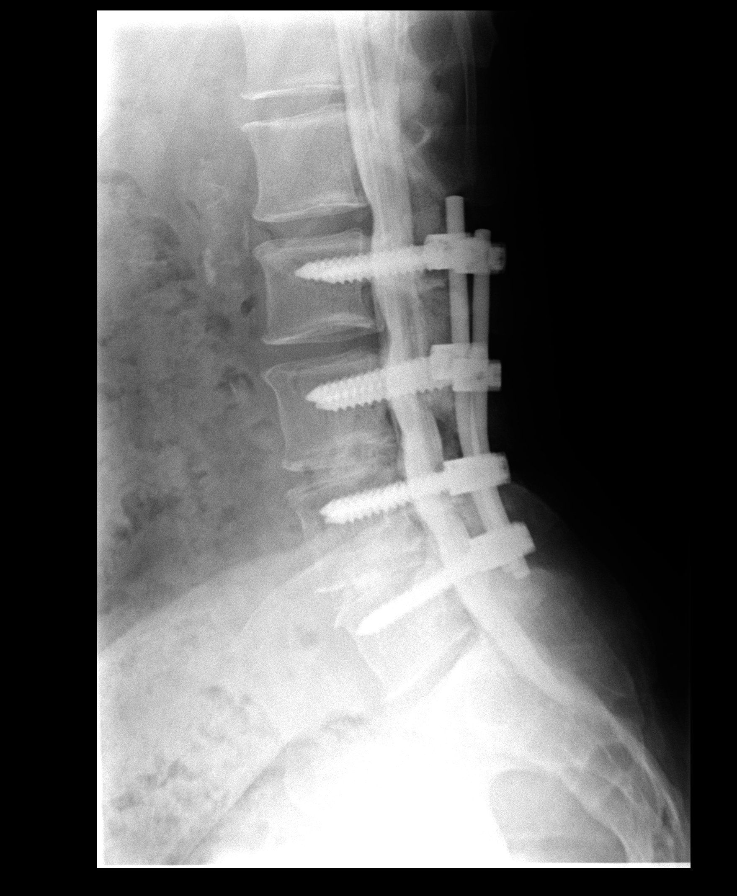

[view not recorded (6 of 6)]
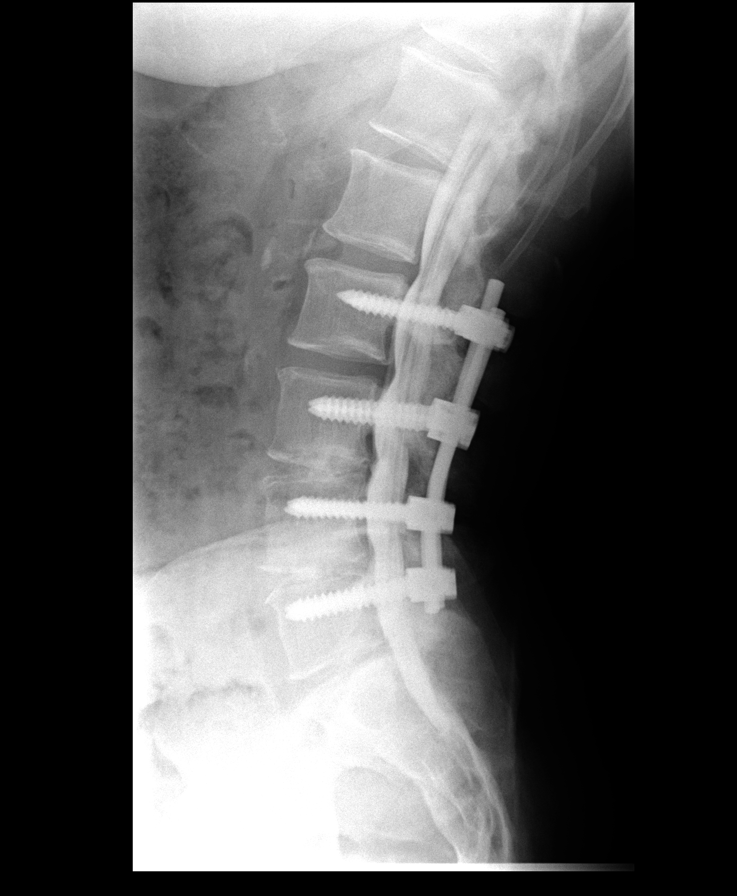

[6 of 6 positions shown; findings below may reference images not displayed]

## 2004-10-08 IMAGING — CT CT L SPINE W/ CM
2 of 8 series · 7 of 20 positions shown, 8 images · IV contrast (omnipaque)
Comparison: none

CLINICAL DATA: The patient has undergone a previous lumbar fusion.  She has developed right leg pain posterolaterally which extends into her foot.  She is referred for a lumbar myelogram.
 LUMBAR MYELOGRAM
 Following informed consent, I placed a 22 gauge spinal needle into the thecal sac centrally at the L2-3 disk level.  The needle was placed from a left posterolateral approach.  CSF is clear.  15 cc of Omnipaque 180 contrast was injected.  The needle was withdrawn.  Multiple images were obtained and supplemented with standing flexion and extension views. 
 The patient has undergone a previous lumbar fusion L[DATE] with posterior intrapedicular screw plate fixation.  The hardware is intact.  The alignment is stable through flexion and extension.  There is no appreciable central stenosis.  Shallow ventral defects are seen at the L1-2 and L2-3 levels.  On the AP and oblique projections, the right root sleeves fill normally without extradural defects or truncation.  Mild truncation of the left L4 root is appreciated.  The fusion is intact.  Stable through flexion and extension.  No extradural defects are demonstrated on the right to account for right sided symptoms.  CT to follow.
 CT LUMBAR SPINE POST INTRATHECAL CONTRAST INJECTION
 L1-2:  Facet degenerative change.  Subligamentous disk protrusion without central nor foraminal stenosis.
 L2-3:  Posterior fusion mass is appreciated.  The facet joints are fused.  Subligamentous disk protrusion.  No appreciable central nor foraminal stenosis.
 L3-4:  Right paracentral disk protrusion does indent the thecal sac.  The L3 root exits without encroachment.  There is no appreciable central stenosis.  A left hemilaminectomy defect is identified.  The facet joints are fused.  
 L4-5:  Pedicle screws are well-positioned.  The facet joints are fused.  The L4 roots exit without encroachment.  A left hemilaminectomy defect is appreciated.
 L5-S1:  Moderate multifactorial foraminal encroachment is seen bilaterally.  No appreciable encroachment upon the L5 roots.
 There is no central stenosis.  Posterior fusion mass is intact.
 IMPRESSION
 1.  L3-4 right paracentral subligamentous disk protrusion is identified.  This does indent the thecal sac mildly without appreciable central stenosis.  No significant foraminal encroachment nor encroachment upon the exiting L3 roots.
 2.  Mild to moderate multifactorial foraminal encroachment bilaterally without encroachment upon the exiting L5 root.
 CT MULTIPLANAR REFORMATTED IMAGING
 Sagittal and coronal projections:
 Posterior fusion hardware extends into the L2, L3, L4 and L5 pedicles bilaterally.  The hardware is intact. The pedicle screws are well-positioned.  Interbody bone plugs are seen at L3-4, L4-5.  The nerve roots exit bilaterally without evidence for encroachment.  No appreciable central stenosis.  The prevertebral soft tissues are unremarkable.  Atherosclerotic changes are noted within the aorta. 
 Pedicle screws and hardware are intact and well-positioned.  No appreciable central nor foraminal stenosis.

[Series 4: recon 3: l-spine helical · axial · 0.27mm/px · z∈[-90,+21]mm · 4 of 149 slices shown, 5 images]
[im 30/149  soft-tissue]
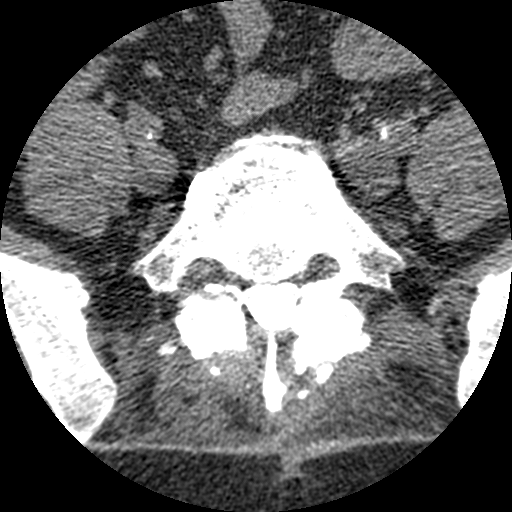
[im 30/149  bone]
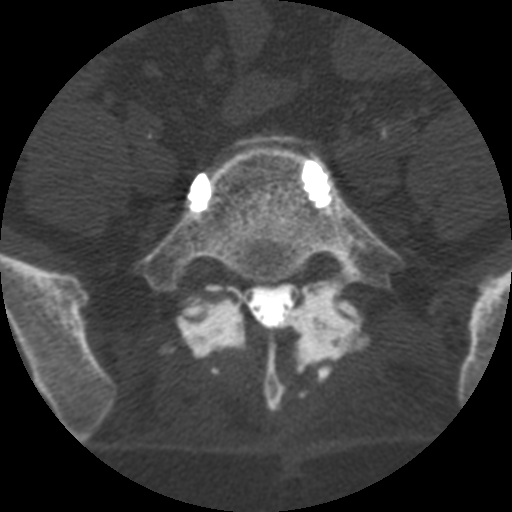
[im 60/149  bone]
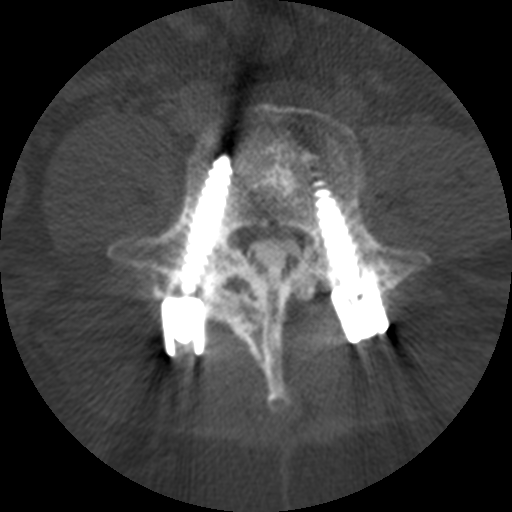
[im 89/149  bone]
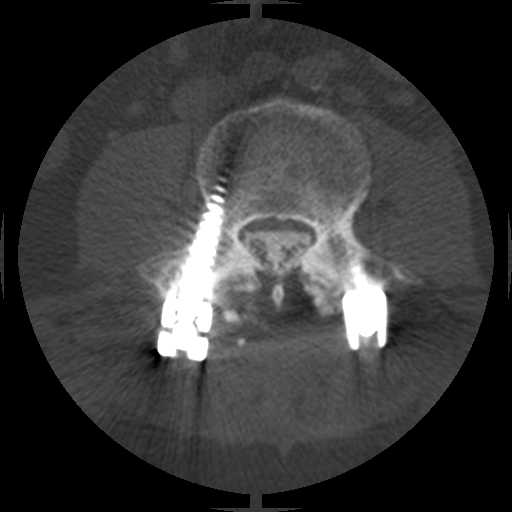
[im 119/149  bone]
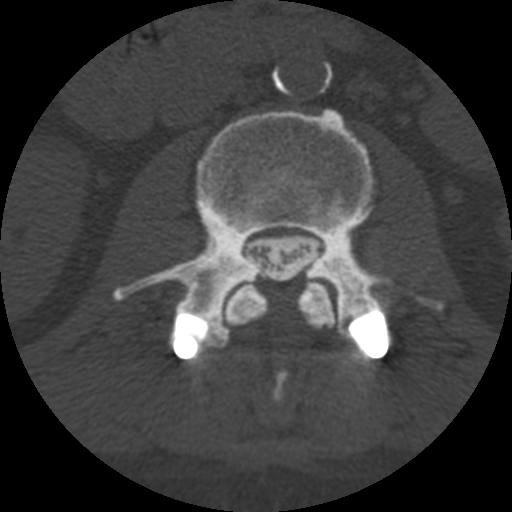

[Series 751: reformatted · sagittal · 0.37mm/px · 3 of 40 slices shown]
[im 8/40  bone]
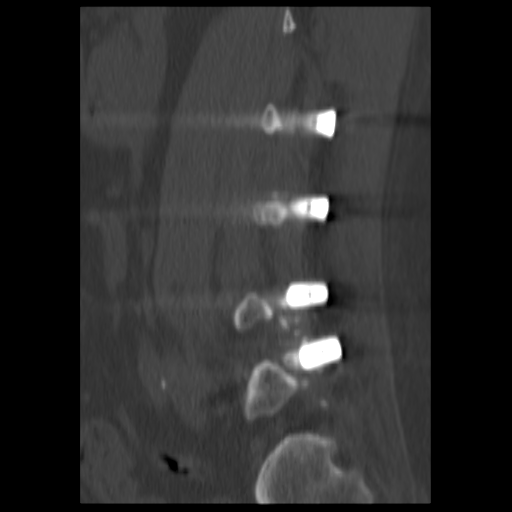
[im 16/40  bone]
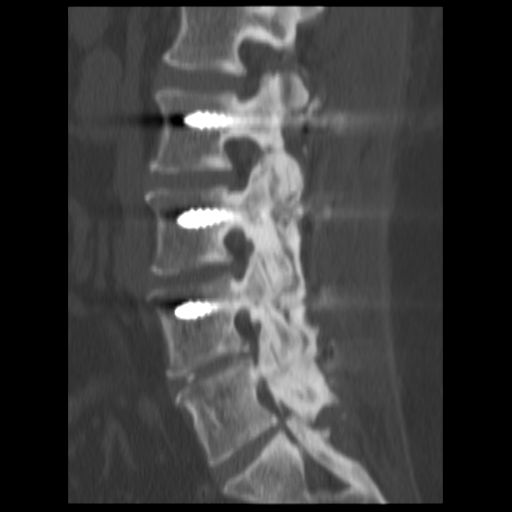
[im 24/40  bone]
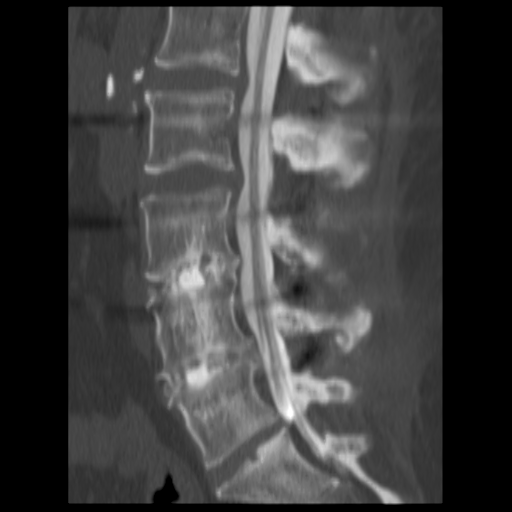

[7 of 20 positions shown; findings below may reference images not displayed]

## 2004-10-17 ENCOUNTER — Inpatient Hospital Stay (HOSPITAL_COMMUNITY): Admission: EM | Admit: 2004-10-17 | Discharge: 2004-10-20 | Payer: Self-pay | Admitting: Emergency Medicine

## 2004-10-21 ENCOUNTER — Inpatient Hospital Stay (HOSPITAL_COMMUNITY): Admission: EM | Admit: 2004-10-21 | Discharge: 2004-10-25 | Payer: Self-pay | Admitting: Emergency Medicine

## 2004-11-27 ENCOUNTER — Emergency Department (HOSPITAL_COMMUNITY): Admission: EM | Admit: 2004-11-27 | Discharge: 2004-11-28 | Payer: Self-pay | Admitting: Emergency Medicine

## 2004-11-30 ENCOUNTER — Inpatient Hospital Stay (HOSPITAL_COMMUNITY): Admission: EM | Admit: 2004-11-30 | Discharge: 2004-12-05 | Payer: Self-pay | Admitting: Emergency Medicine

## 2004-11-30 IMAGING — CR DG ABDOMEN ACUTE W/ 1V CHEST
3 series · 3 of 3 positions shown · non-contrast
Comparison: none

CLINICAL DATA: Nausea and vomiting.  
 ACUTE ABDOMEN WITH PA CHEST
 Comparing CT scan of 10/06/03.
 Port-A-Cath is in place.  Clips in the right upper quadrant are from prior cholecystectomy.  No free pelvic fluid.  Lungs appear clear.  The heart and mediastinum appear unremarkable.
 There is a small amount of gas in the stomach.  There is some gas and stool in the colon.  No dilated bowel.  No significant abnormal air-fluid levels.  
 The patient has lower lumbar posterolateral rod and pedicle screw fixation.
 IMPRESSION
 Overall unremarkable bowel gas pattern.

[view not recorded (1 of 3)]
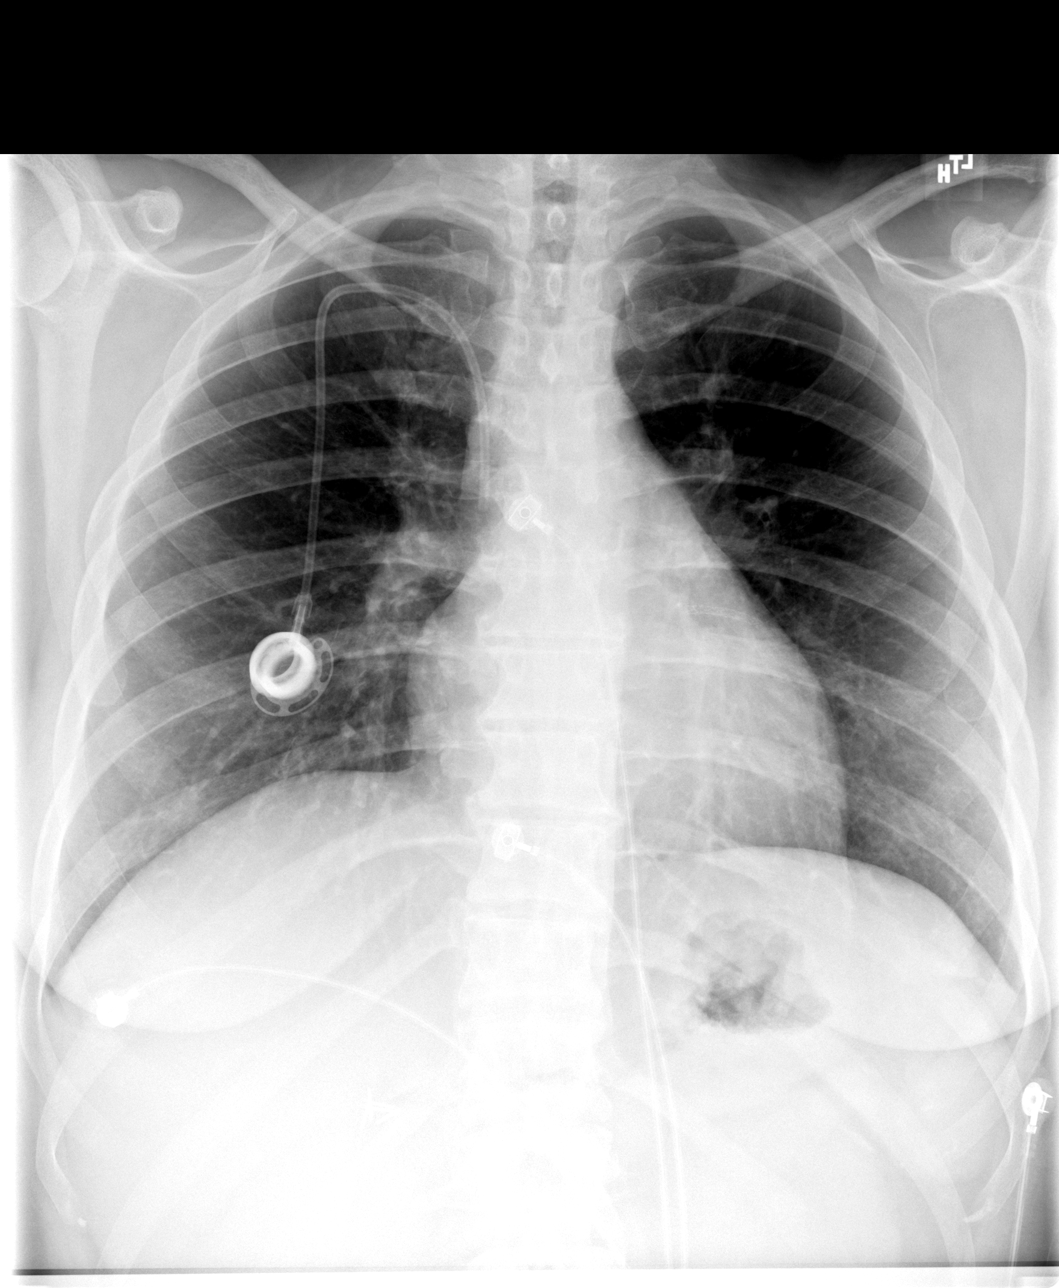

[view not recorded (2 of 3)]
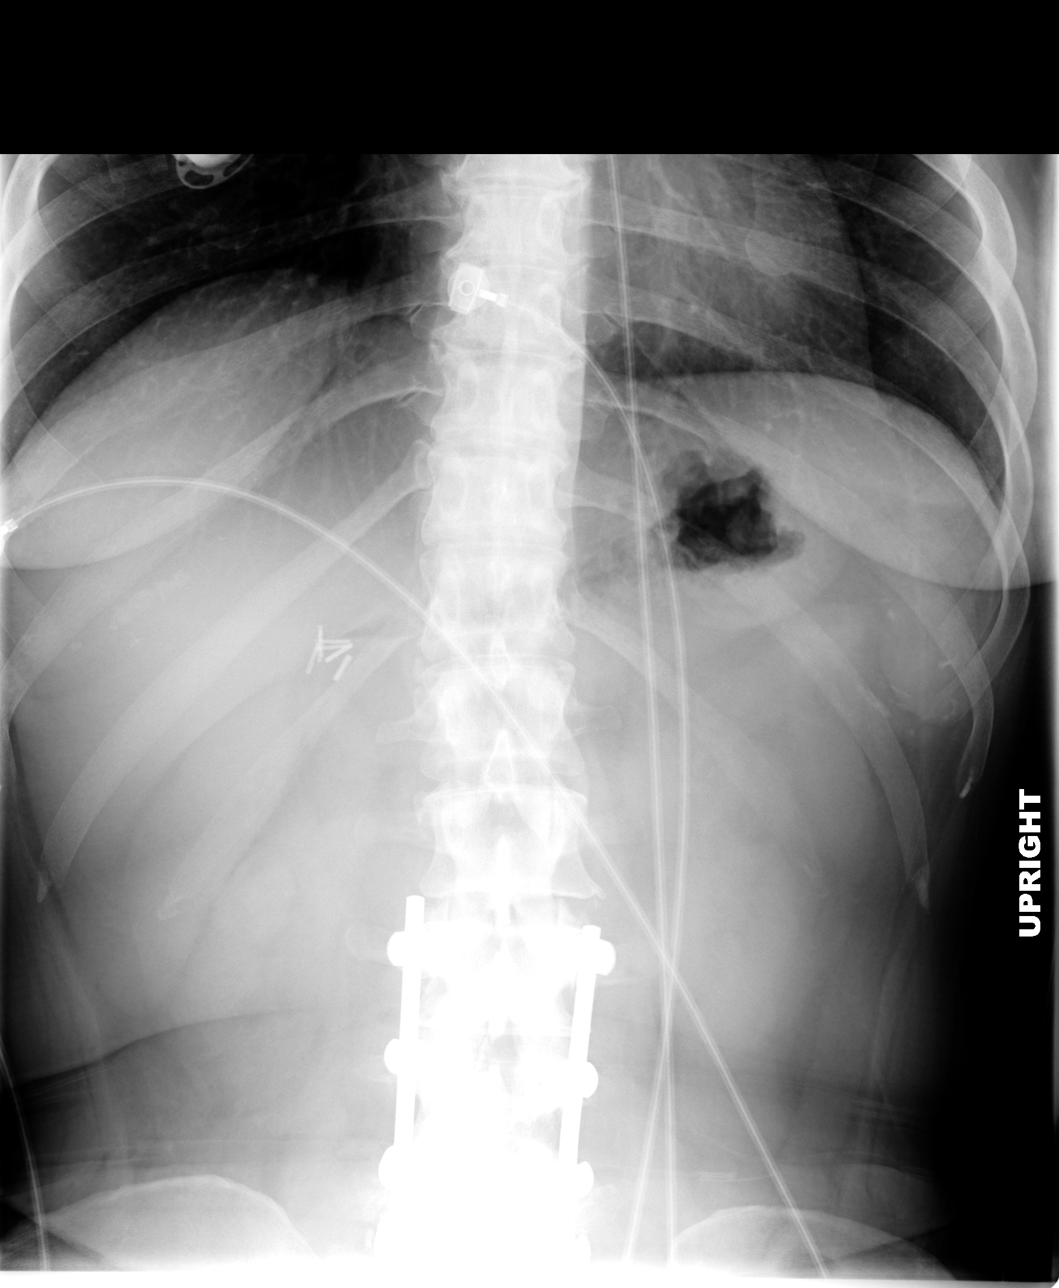

[view not recorded (3 of 3)]
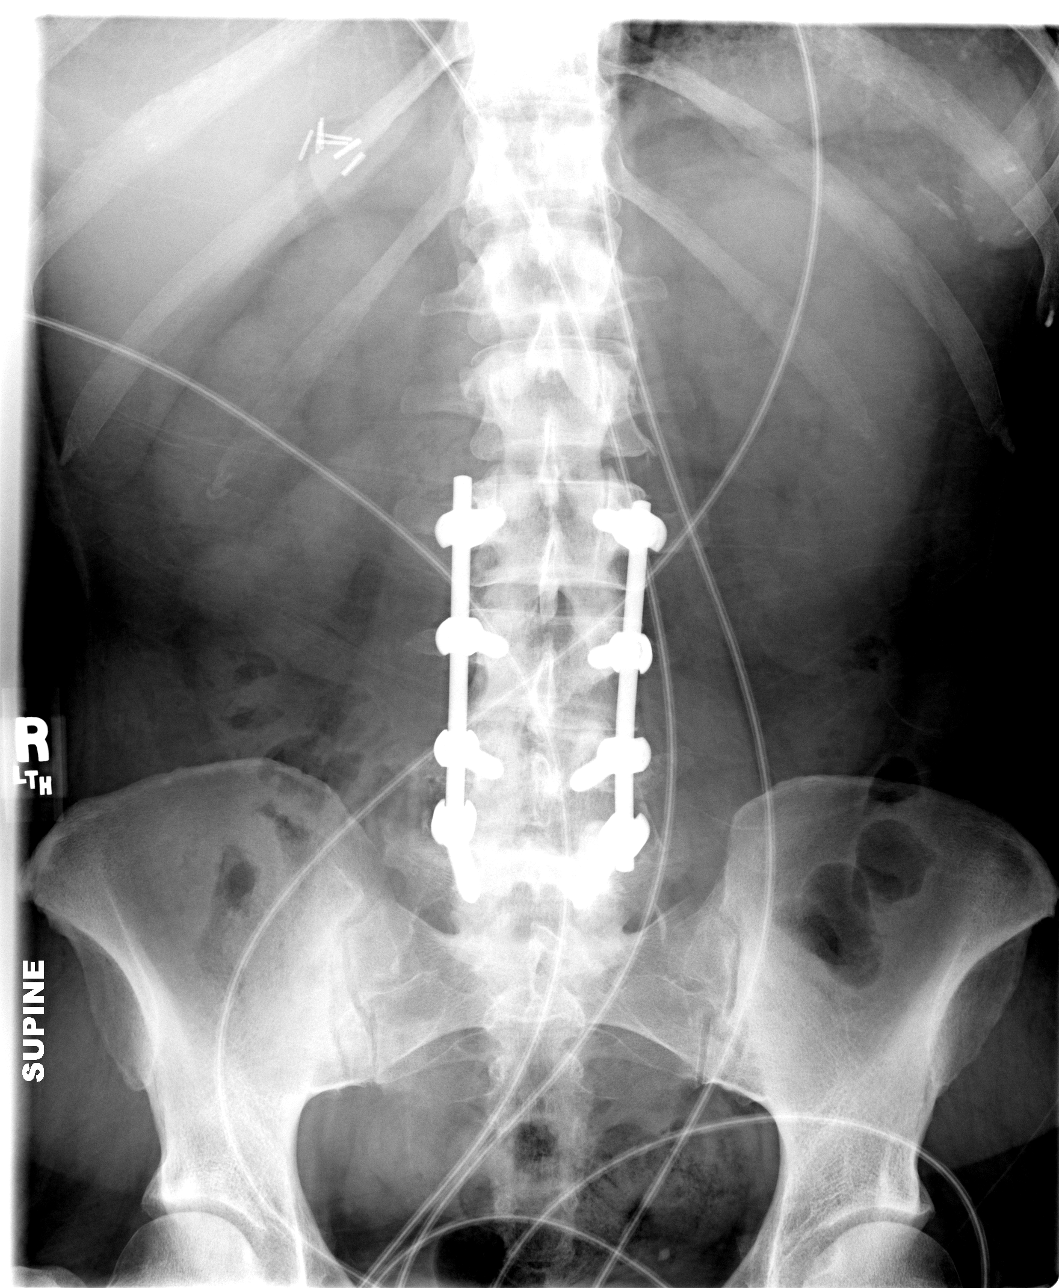

[3 of 3 positions shown; findings below may reference images not displayed]

## 2004-12-06 ENCOUNTER — Inpatient Hospital Stay (HOSPITAL_COMMUNITY): Admission: EM | Admit: 2004-12-06 | Discharge: 2004-12-14 | Payer: Self-pay | Admitting: Emergency Medicine

## 2004-12-06 ENCOUNTER — Ambulatory Visit: Payer: Self-pay | Admitting: *Deleted

## 2004-12-10 IMAGING — CT CT PELVIS W/ CM
1 of 3 series · 14 of 32 positions shown, 19 images · IV contrast (CONTRAST)
Comparison: none

CLINICAL DATA: Generalized abdominal pain and burning; elevated white count
TECHNIQUE: Multidetector helical CT of the abdomen and pelvis was performed during administration of 100 cc of Omnipaque 300.  Oral contrast was refused by the patient.  Comparison is made to prior study on 10/06/03. 
 ABDOMEN CT WITH CONTRAST
 Surgical clips are seen from prior cholecystectomy.  There is no evidence of biliary dilatation.  The liver, spleen, pancreas, and kidneys are normal in appearance. 
 A nodular left adrenal gland is seen with a small fat density component.  This is stable and consistent with a benign myelolipoma of the adrenal gland.  No other soft tissue masses are seen and there is no evidence of lymphadenopathy.  There is no evidence of inflammatory process or abnormal fluid collections. 
 IMPRESSION
 1.  No acute abdominal process.  
 2.  Stable adrenal myelolipoma.
 PELVIS CT WITH CONTRAST
 There is no evidence of pelvic masses or adenopathy.  There is no evidence of inflammatory process or abnormal fluid collections.  The unopacified bowel loops are unremarkable in appearance.
 Negative pelvis CT.

[Series 893: — · axial · 0.76mm/px · z∈[+1308,+1722]mm · 14 of 93 slices shown, 19 images]
[im 5/93  soft-tissue]
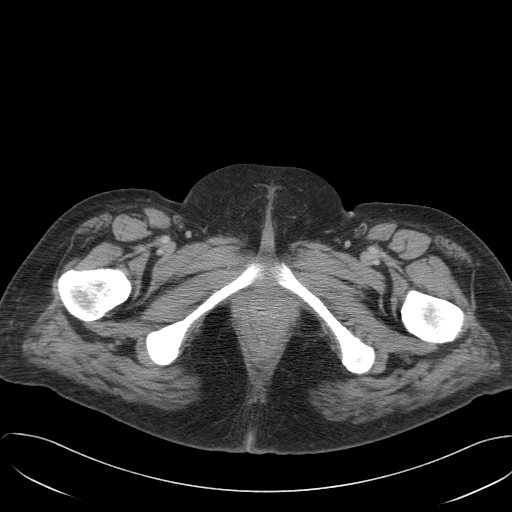
[im 5/93  bone]
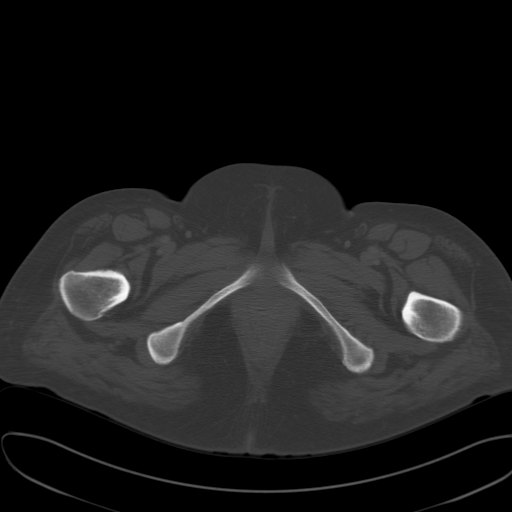
[im 15/93  soft-tissue]
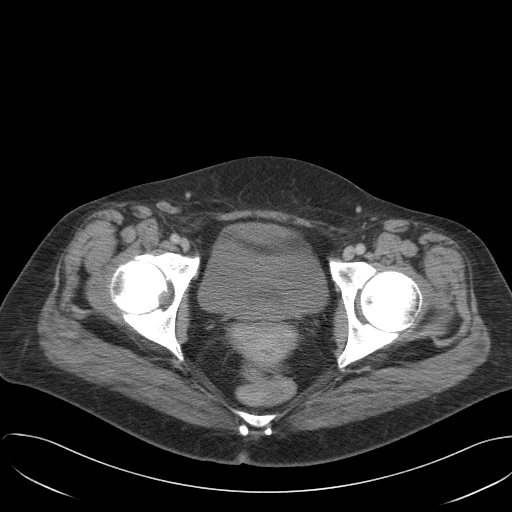
[im 20/93  soft-tissue]
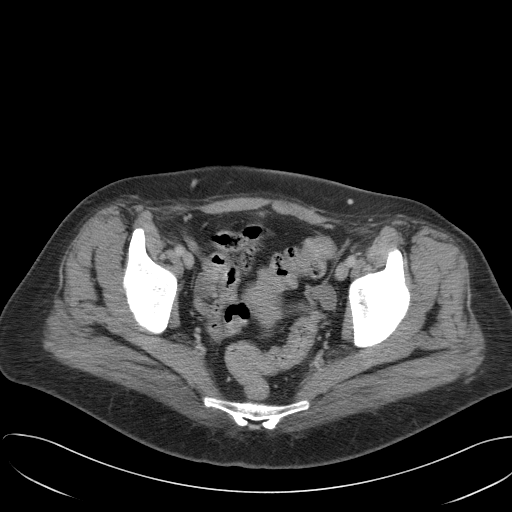
[im 25/93  soft-tissue]
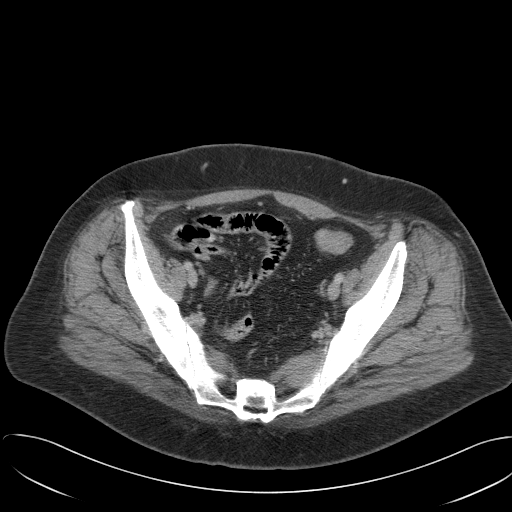
[im 34/93  soft-tissue]
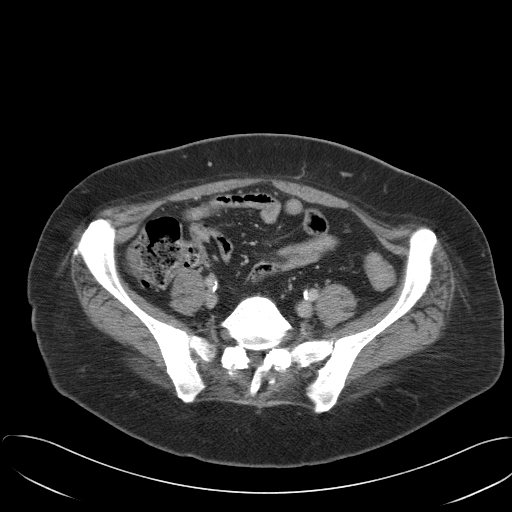
[im 39/93  soft-tissue]
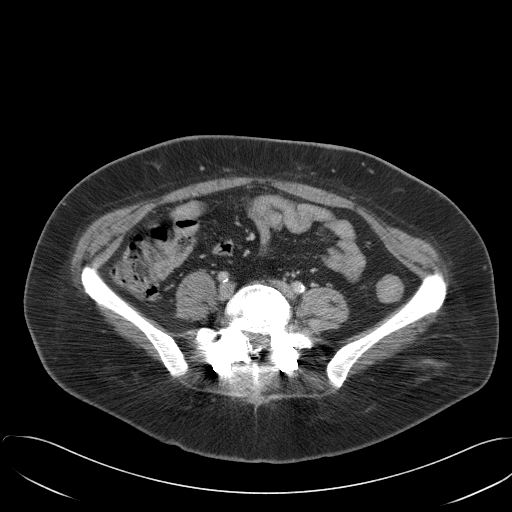
[im 49/93  soft-tissue]
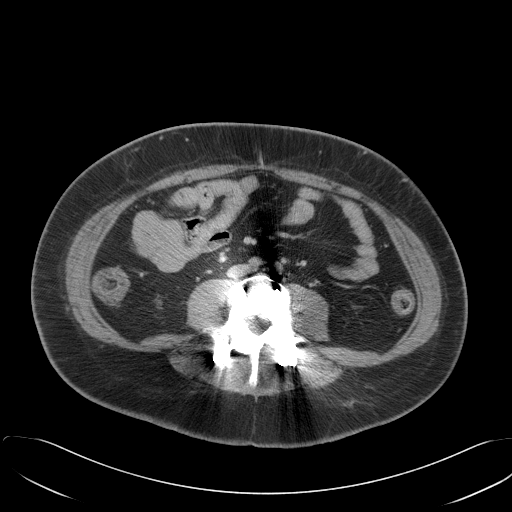
[im 54/93  soft-tissue]
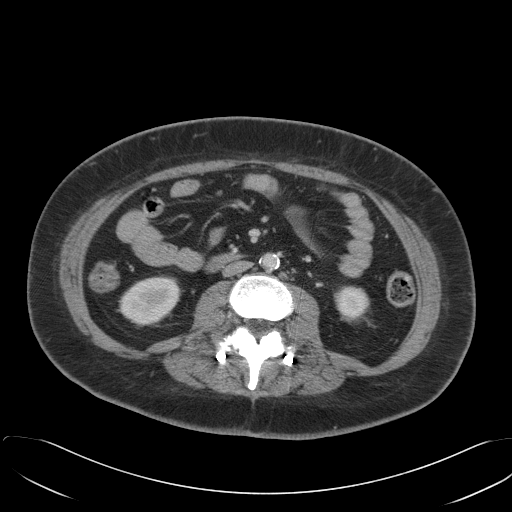
[im 59/93  soft-tissue]
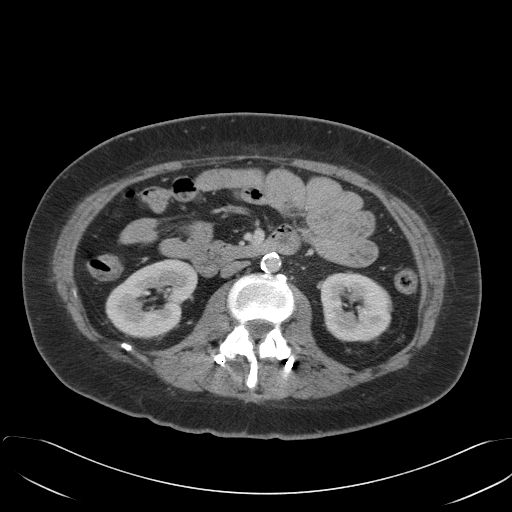
[im 59/93  bone]
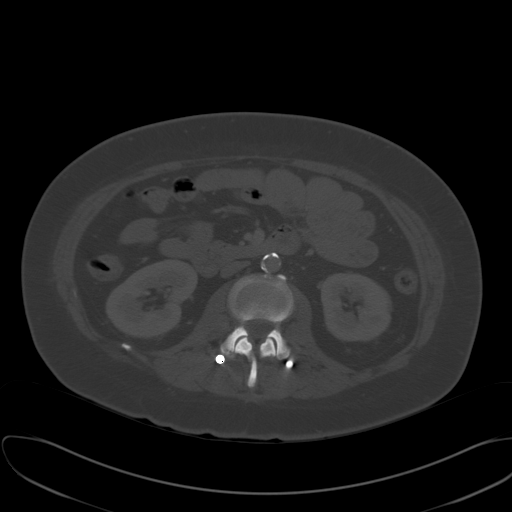
[im 68/93  soft-tissue]
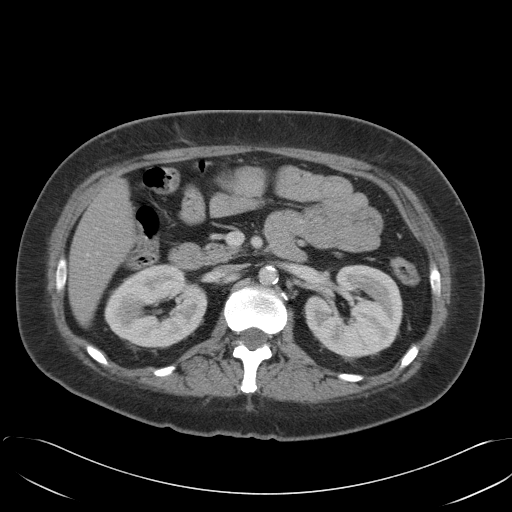
[im 73/93  soft-tissue]
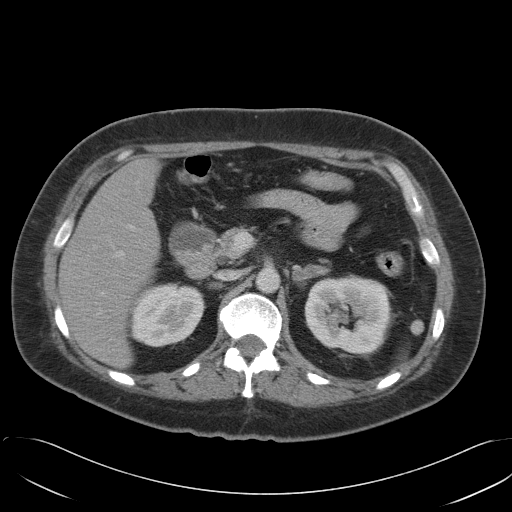
[im 73/93  lung]
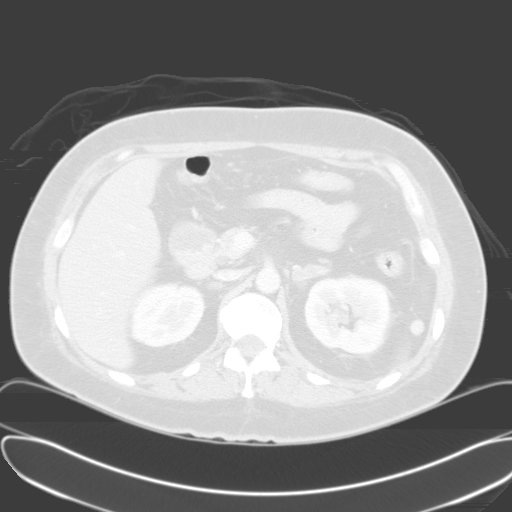
[im 78/93  soft-tissue]
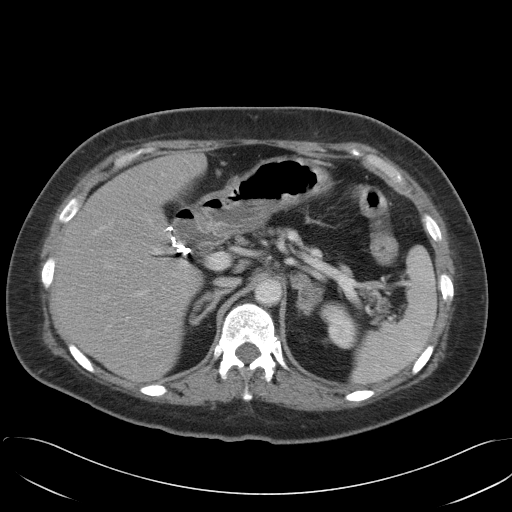
[im 78/93  lung]
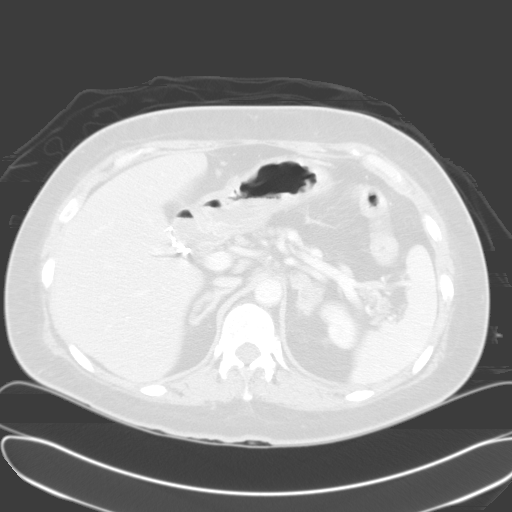
[im 83/93  lung]
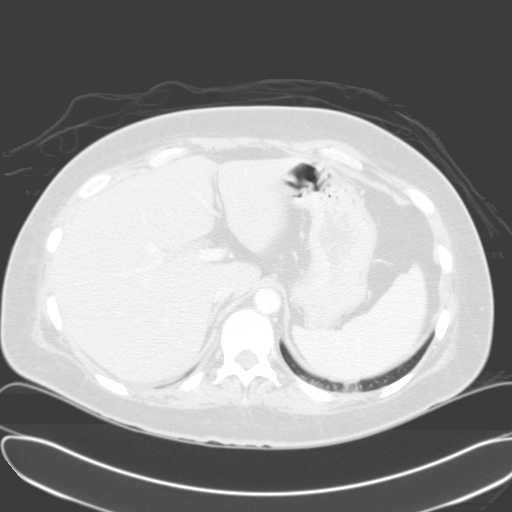
[im 88/93  soft-tissue]
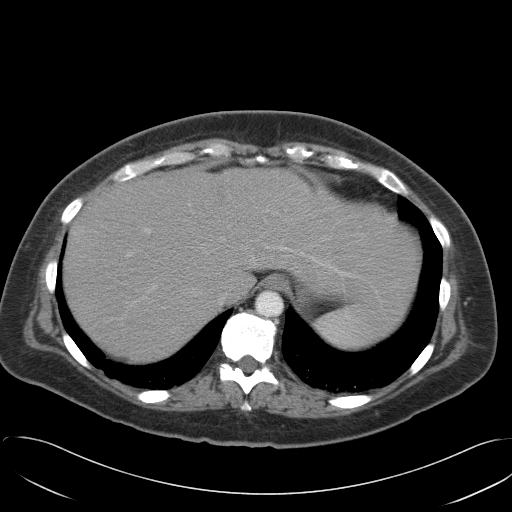
[im 88/93  lung]
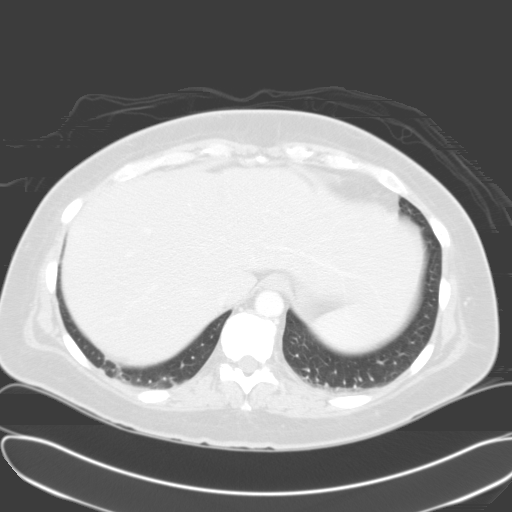

[14 of 32 positions shown; findings below may reference images not displayed]

## 2004-12-31 ENCOUNTER — Inpatient Hospital Stay (HOSPITAL_COMMUNITY): Admission: EM | Admit: 2004-12-31 | Discharge: 2005-01-03 | Payer: Self-pay | Admitting: Emergency Medicine

## 2005-01-04 ENCOUNTER — Ambulatory Visit: Payer: Self-pay | Admitting: Internal Medicine

## 2005-01-04 ENCOUNTER — Inpatient Hospital Stay (HOSPITAL_COMMUNITY): Admission: EM | Admit: 2005-01-04 | Discharge: 2005-01-06 | Payer: Self-pay | Admitting: Emergency Medicine

## 2005-01-09 ENCOUNTER — Encounter (HOSPITAL_COMMUNITY): Admission: RE | Admit: 2005-01-09 | Discharge: 2005-02-08 | Payer: Self-pay | Admitting: Family Medicine

## 2005-01-09 ENCOUNTER — Ambulatory Visit (HOSPITAL_COMMUNITY): Payer: Self-pay | Admitting: Family Medicine

## 2005-01-15 ENCOUNTER — Inpatient Hospital Stay (HOSPITAL_COMMUNITY): Admission: EM | Admit: 2005-01-15 | Discharge: 2005-01-19 | Payer: Self-pay | Admitting: *Deleted

## 2005-01-20 ENCOUNTER — Inpatient Hospital Stay (HOSPITAL_COMMUNITY): Admission: EM | Admit: 2005-01-20 | Discharge: 2005-01-23 | Payer: Self-pay | Admitting: Emergency Medicine

## 2005-02-11 IMAGING — CR DG ABDOMEN 1V
1 series · 1 of 1 positions shown · non-contrast
Comparison: none

CLINICAL DATA: Chest pain, nausea, and vomiting.
 FLAT ABDOMEN ? 02/14/04 AT 4700 HOURS

[view not recorded]
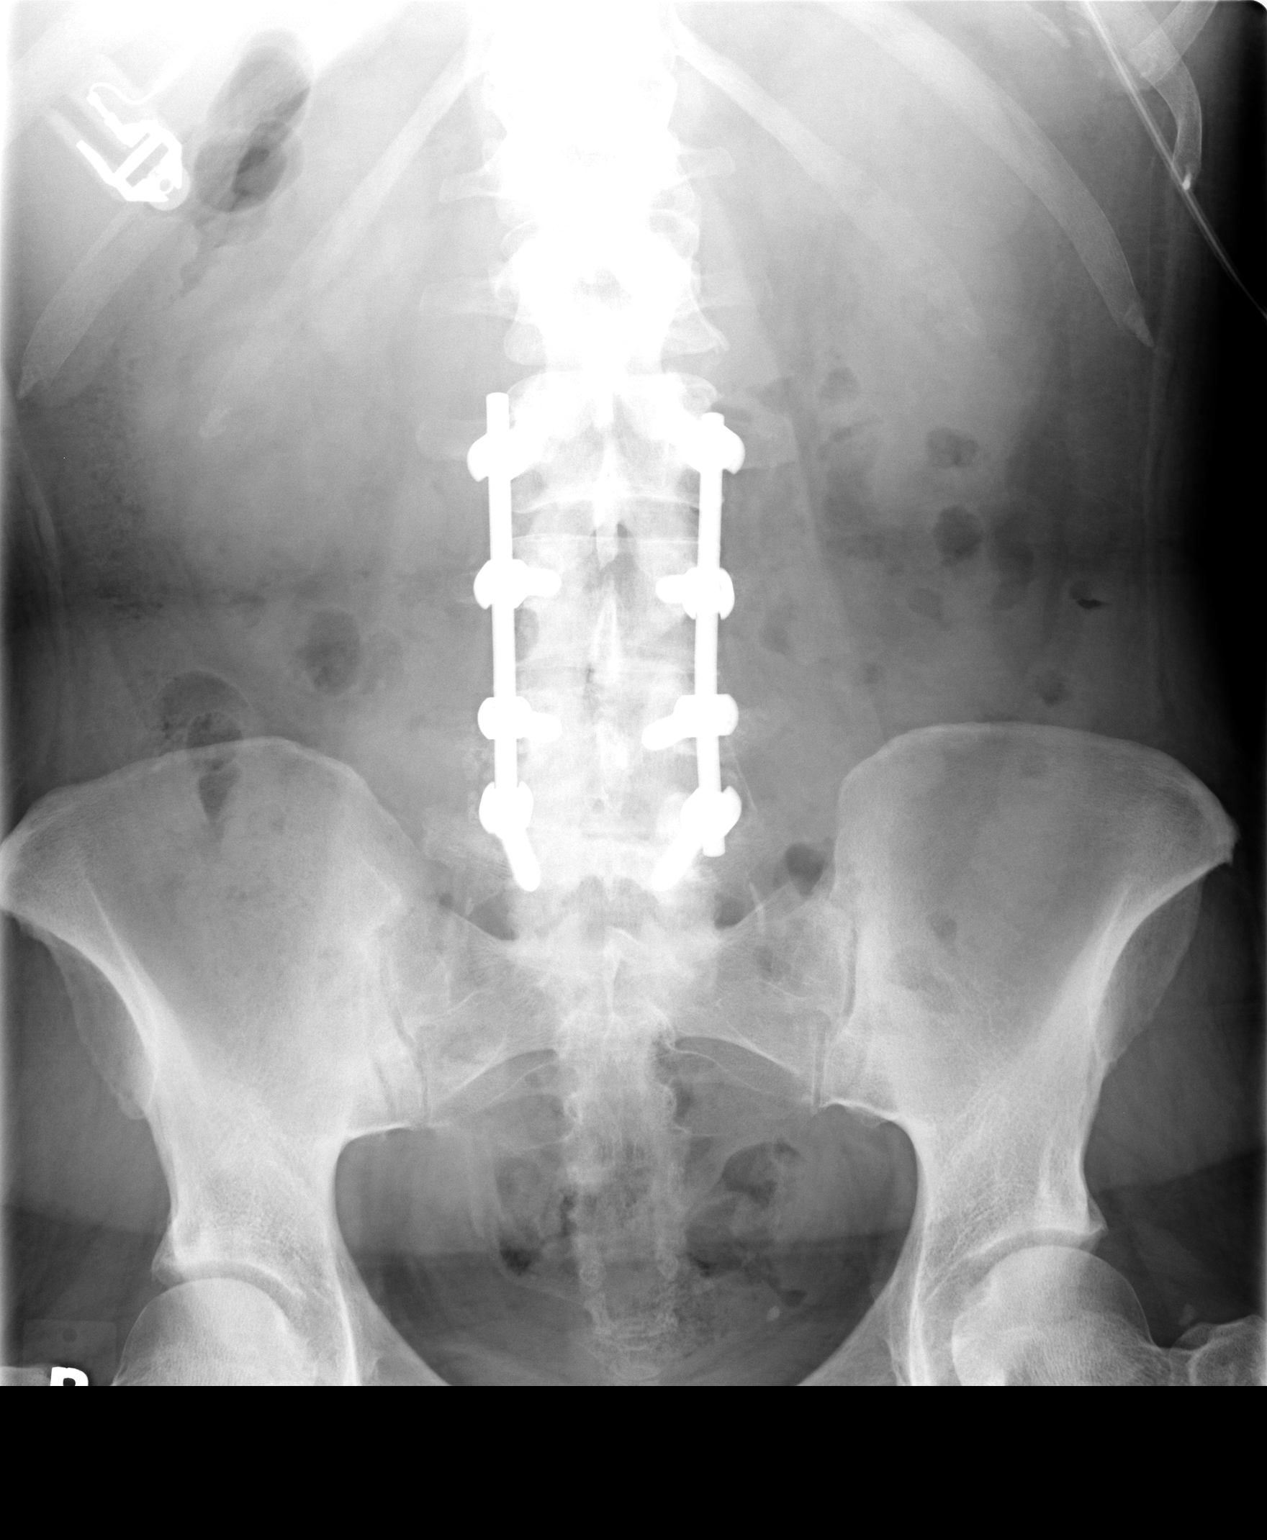

[1 of 1 positions shown; findings below may reference images not displayed]

FINDINGS: Gas is seen in small and large bowel.  There are no disproportionately dilated loops of bowel.  Stabilization hardware is noted in the lumbar spine.  A probable phlebolith projects over the pelvis.
 IMPRESSION
 Normal bowel gas pattern.

## 2005-02-18 ENCOUNTER — Inpatient Hospital Stay (HOSPITAL_COMMUNITY): Admission: EM | Admit: 2005-02-18 | Discharge: 2005-02-20 | Payer: Self-pay | Admitting: Emergency Medicine

## 2005-03-05 IMAGING — US US ABDOMEN COMPLETE
1 series · 14 of 25 positions shown · non-contrast
Comparison: none

CLINICAL DATA: Vomiting with hypokalemia.  
ABDOMINAL SONOGRAM COMPLETE

[Series 1: unknown · 0.34mm/px · 14 of 61 slices shown]
[im 1/61]
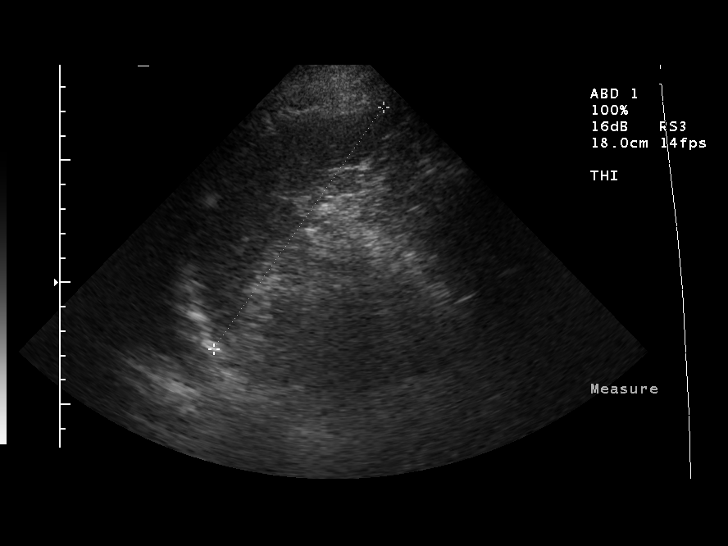
[im 6/61]
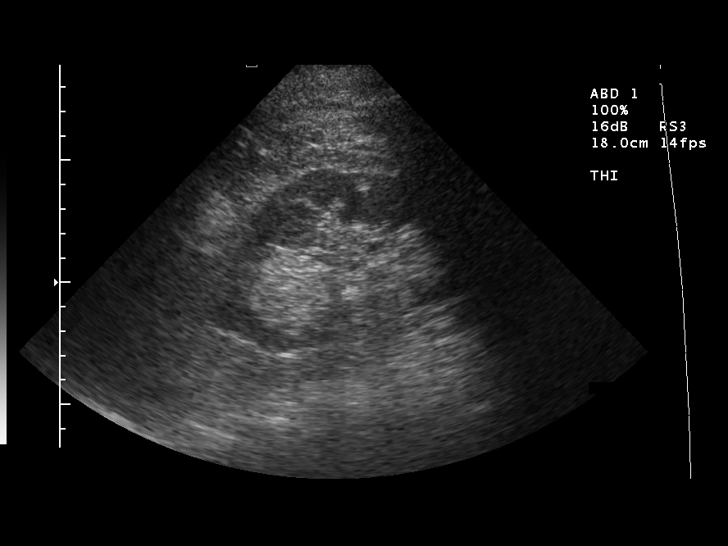
[im 11/61]
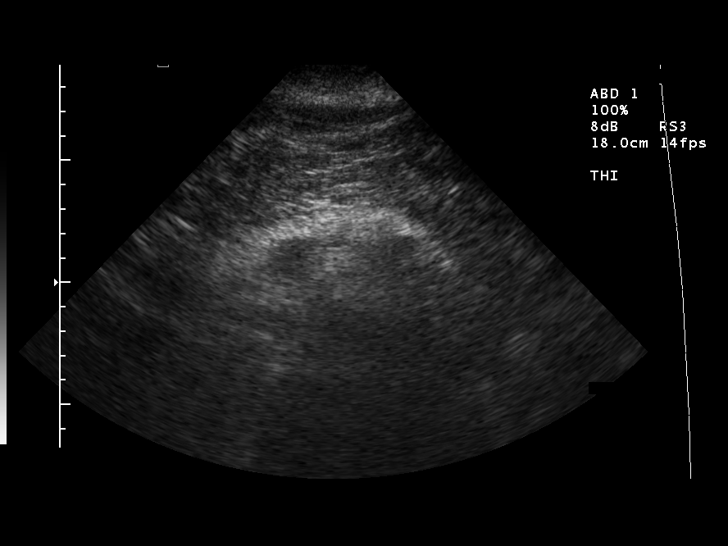
[im 16/61]
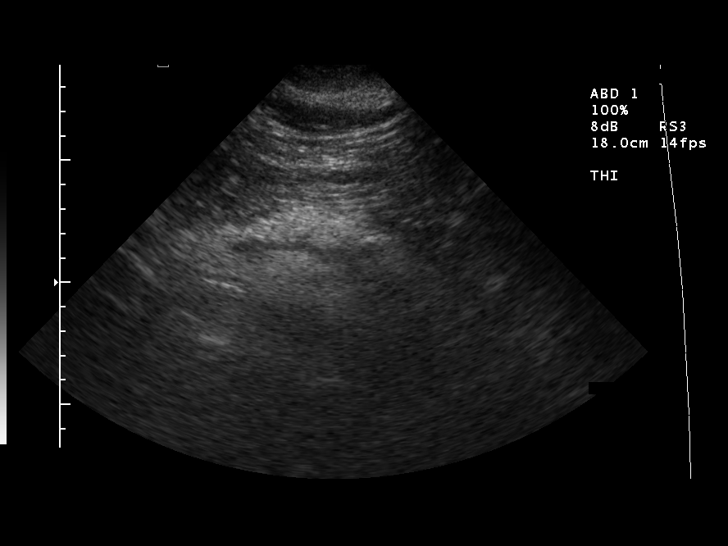
[im 21/61]
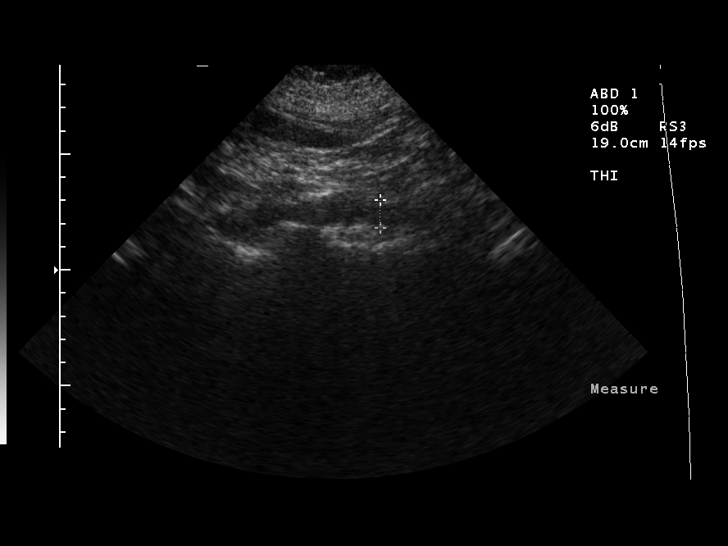
[im 23/61]
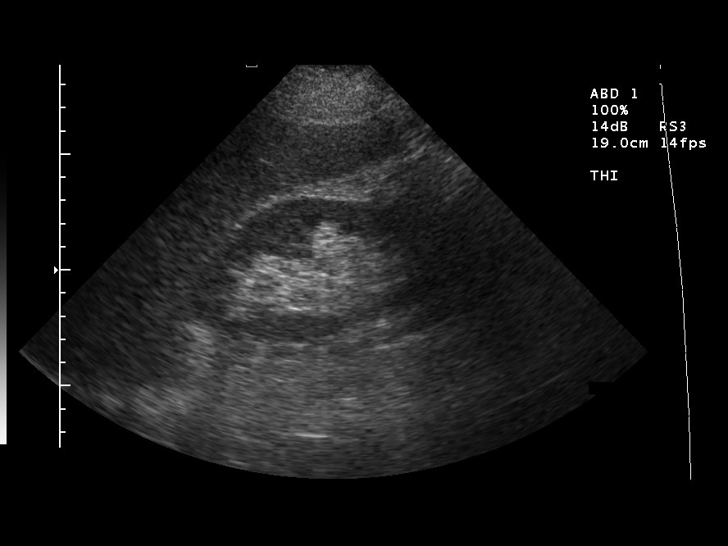
[im 28/61]
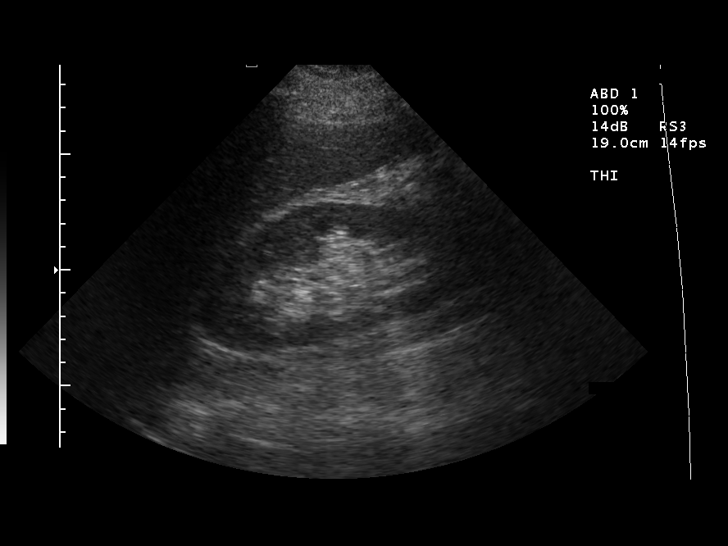
[im 33/61]
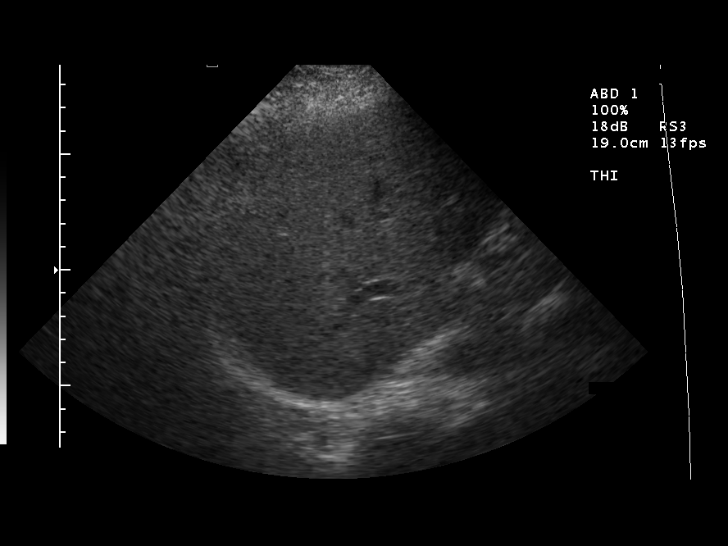
[im 38/61]
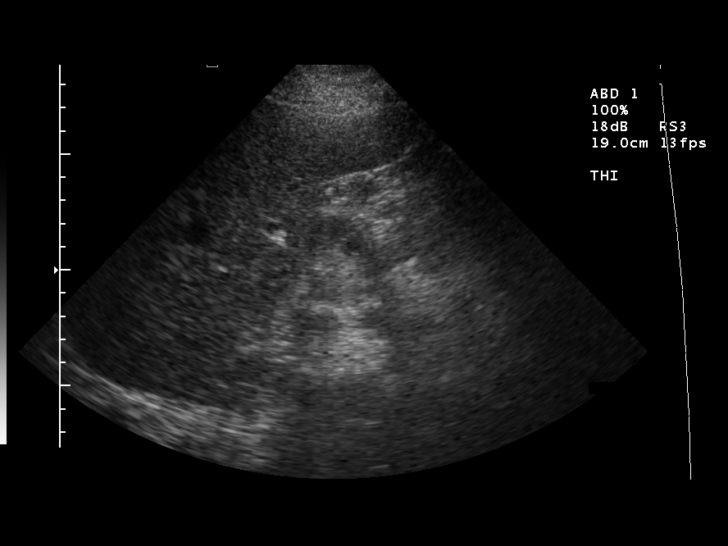
[im 41/61]
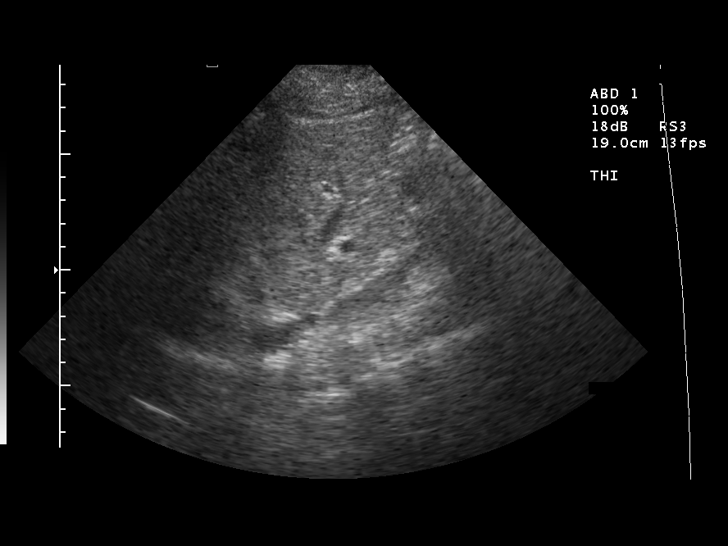
[im 46/61]
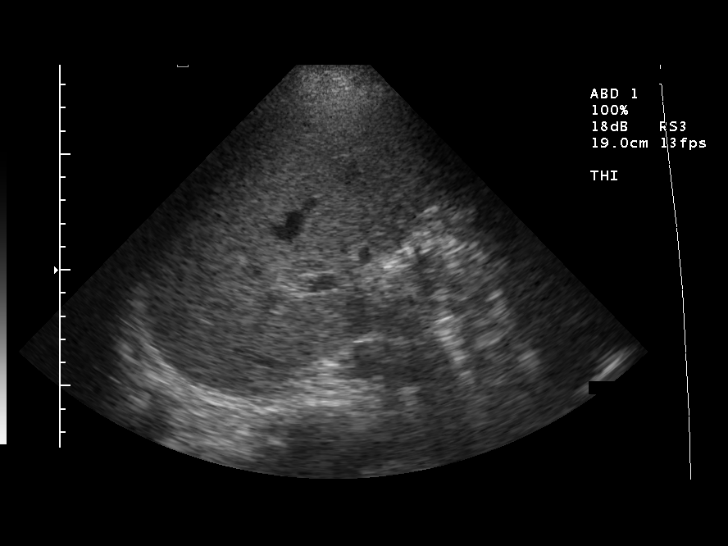
[im 51/61]
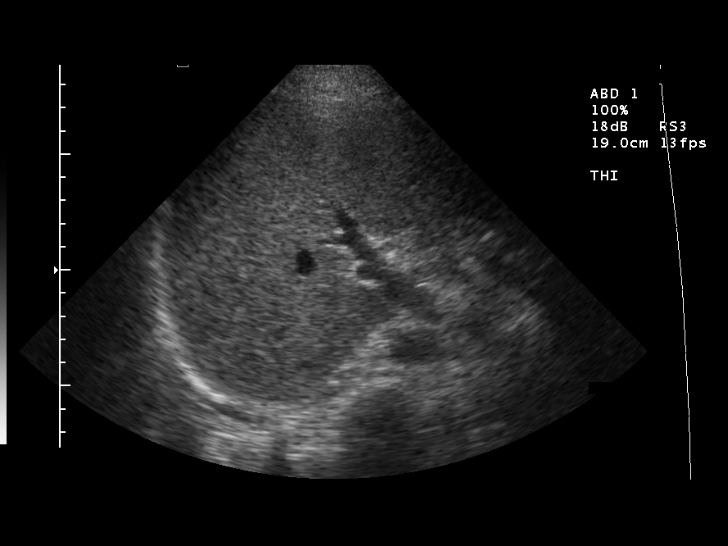
[im 56/61]
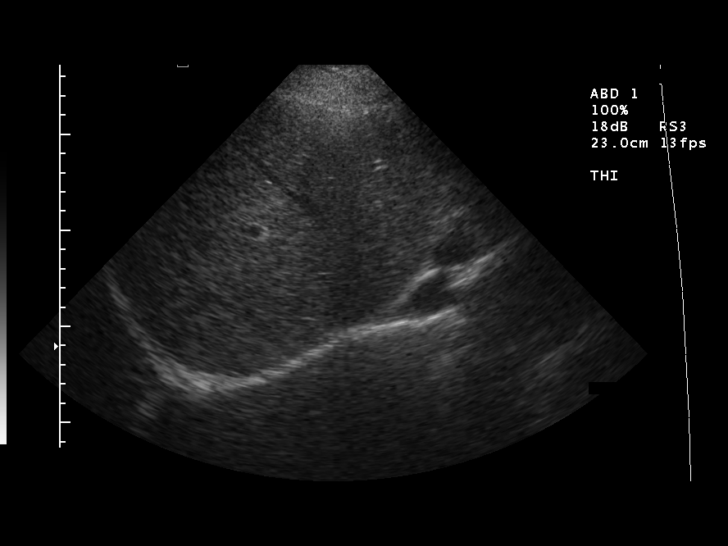
[im 61/61]
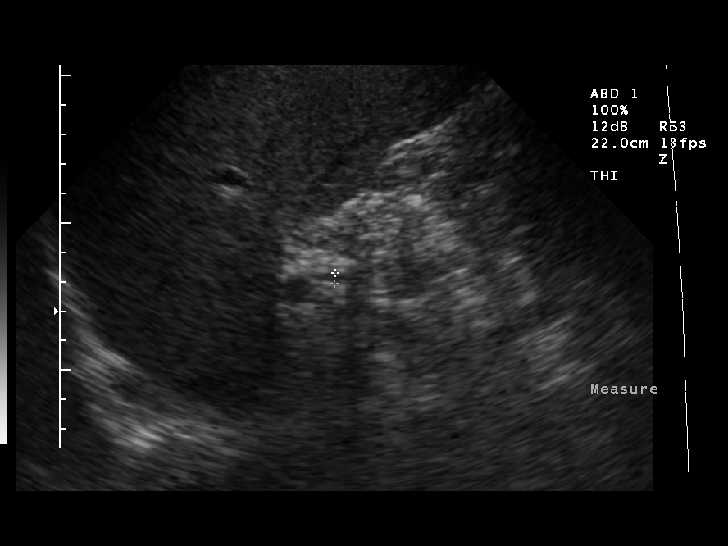

[14 of 25 positions shown; findings below may reference images not displayed]

FINDINGS: Status-post cholecystectomy. No evidence of focal hepatic lesion or intrahepatic biliary duct dilatation.  ommon bile duct 3.6mm.  
Limited evaluation of portions of the pancreas, abdominal aorta and inferior vena cava secondary to overlying bowel gas.  The pancreas appears fatty replaced without obvious mass.  Portions of the inferior vena cava and aorta visualized are unremarkable.  
Right kidney 13.9 cm and the left kidney 13.1 cm in length without hydronephrosis or focal renal mass.  
IMPRESSION 
Status-post cholecystectomy. 
Slight increase echogenicity of the liver suggestive of mild fatty infiltration without focal lesion identified. 
Fatty infiltration of the pancreas.  Portions of the pancreas, inferior vena cava and aorta are not well visualized secondary to overlying bowel gas.  The portions visualized are unremarkable.

## 2005-03-07 IMAGING — CR DG FOREARM 2V*L*
1 series · 1 of 1 positions shown · non-contrast
Comparison: none

CLINICAL DATA: Fall.  Left arm, pelvic, and left thigh injury and pain.
 LEFT ELBOW FOUR VIEWS
 There is no evidence of fracture or dislocation.  There is no evidence of elbow joint effusion.  Degenerative spurring is seen involving the proximal ulna at the elbow joint.  Mild spurring is also noted along the lateral condyle of the distal humerus.
 IMPRESSION
 1.  No acute radiographic findings.
 2.  Degenerative spurring. 
 LEFT FOREARM TWO VIEWS
 There is no evidence of fracture or dislocation.  Degenerative spurring is again noted involving the proximal ulna at the elbow joint.
 1.  No evidence of fracture.
 2.  Degenerative spurring of the proximal ulna at the elbow joint.
 PELVIS ONE VIEW
 There is no evidence of pelvic fracture or diastasis.  No other pelvic bone lesions are seen.  Posterior pedicle screws and fixation rods are seen in the lower lumbar spine.
 No evidence of pelvic fracture.
 LEFT FEMUR TWO VIEWS
 Two tiny ossific densities are seen along the greater trochanter of the proximal femur.  These may represent acute or chronic avulsion fragments or other soft tissue ossification.  No other fractures are seen.  There is no evidence of hip dislocation.  
 Tiny ossific densities adjacent to the greater trochanter of the proximal femur, which could represent acute or chronic avulsion fragments or other soft tissue ossification.  Recommend clinical correlation for point tenderness at this site.

[view not recorded]
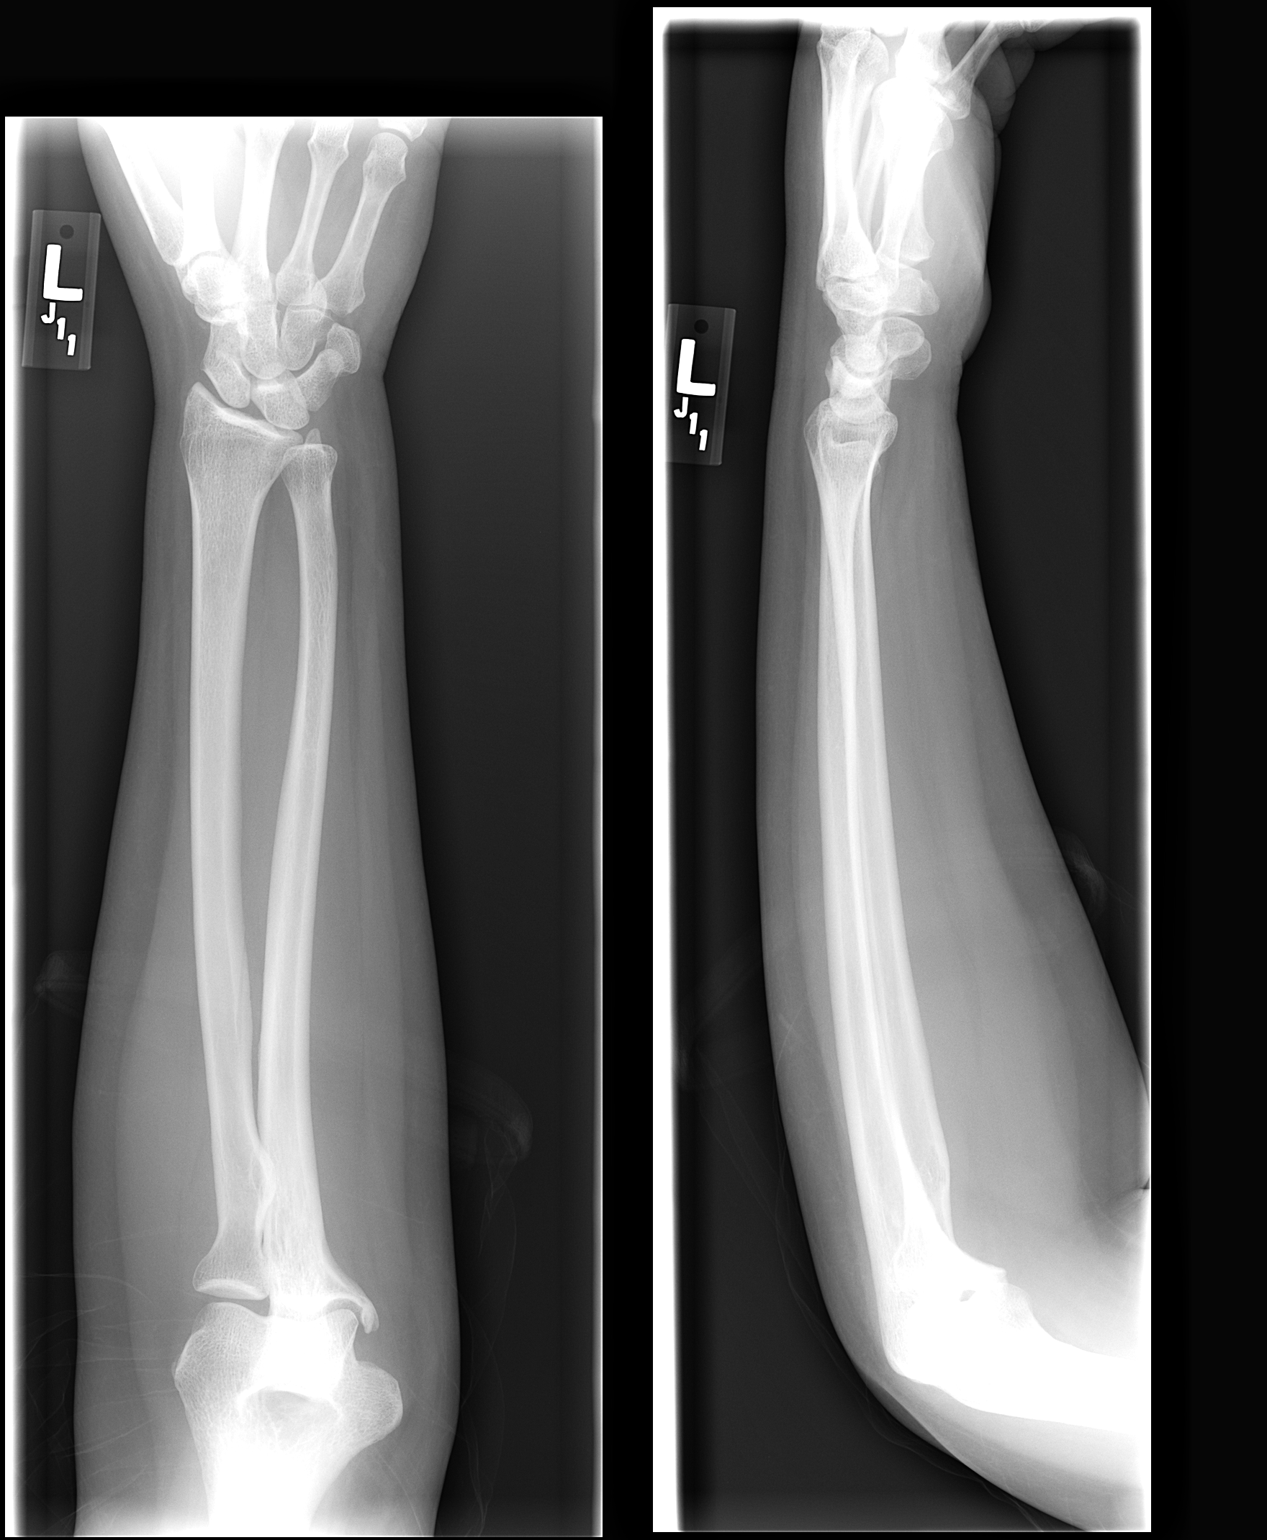

[1 of 1 positions shown; findings below may reference images not displayed]

## 2005-03-07 IMAGING — CR DG PELVIS 1-2V
1 series · 1 of 1 positions shown · non-contrast
Comparison: none

CLINICAL DATA: Fall.  Left arm, pelvic, and left thigh injury and pain.
 LEFT ELBOW FOUR VIEWS
 There is no evidence of fracture or dislocation.  There is no evidence of elbow joint effusion.  Degenerative spurring is seen involving the proximal ulna at the elbow joint.  Mild spurring is also noted along the lateral condyle of the distal humerus.
 IMPRESSION
 1.  No acute radiographic findings.
 2.  Degenerative spurring. 
 LEFT FOREARM TWO VIEWS
 There is no evidence of fracture or dislocation.  Degenerative spurring is again noted involving the proximal ulna at the elbow joint.
 1.  No evidence of fracture.
 2.  Degenerative spurring of the proximal ulna at the elbow joint.
 PELVIS ONE VIEW
 There is no evidence of pelvic fracture or diastasis.  No other pelvic bone lesions are seen.  Posterior pedicle screws and fixation rods are seen in the lower lumbar spine.
 No evidence of pelvic fracture.
 LEFT FEMUR TWO VIEWS
 Two tiny ossific densities are seen along the greater trochanter of the proximal femur.  These may represent acute or chronic avulsion fragments or other soft tissue ossification.  No other fractures are seen.  There is no evidence of hip dislocation.  
 Tiny ossific densities adjacent to the greater trochanter of the proximal femur, which could represent acute or chronic avulsion fragments or other soft tissue ossification.  Recommend clinical correlation for point tenderness at this site.

[view not recorded]
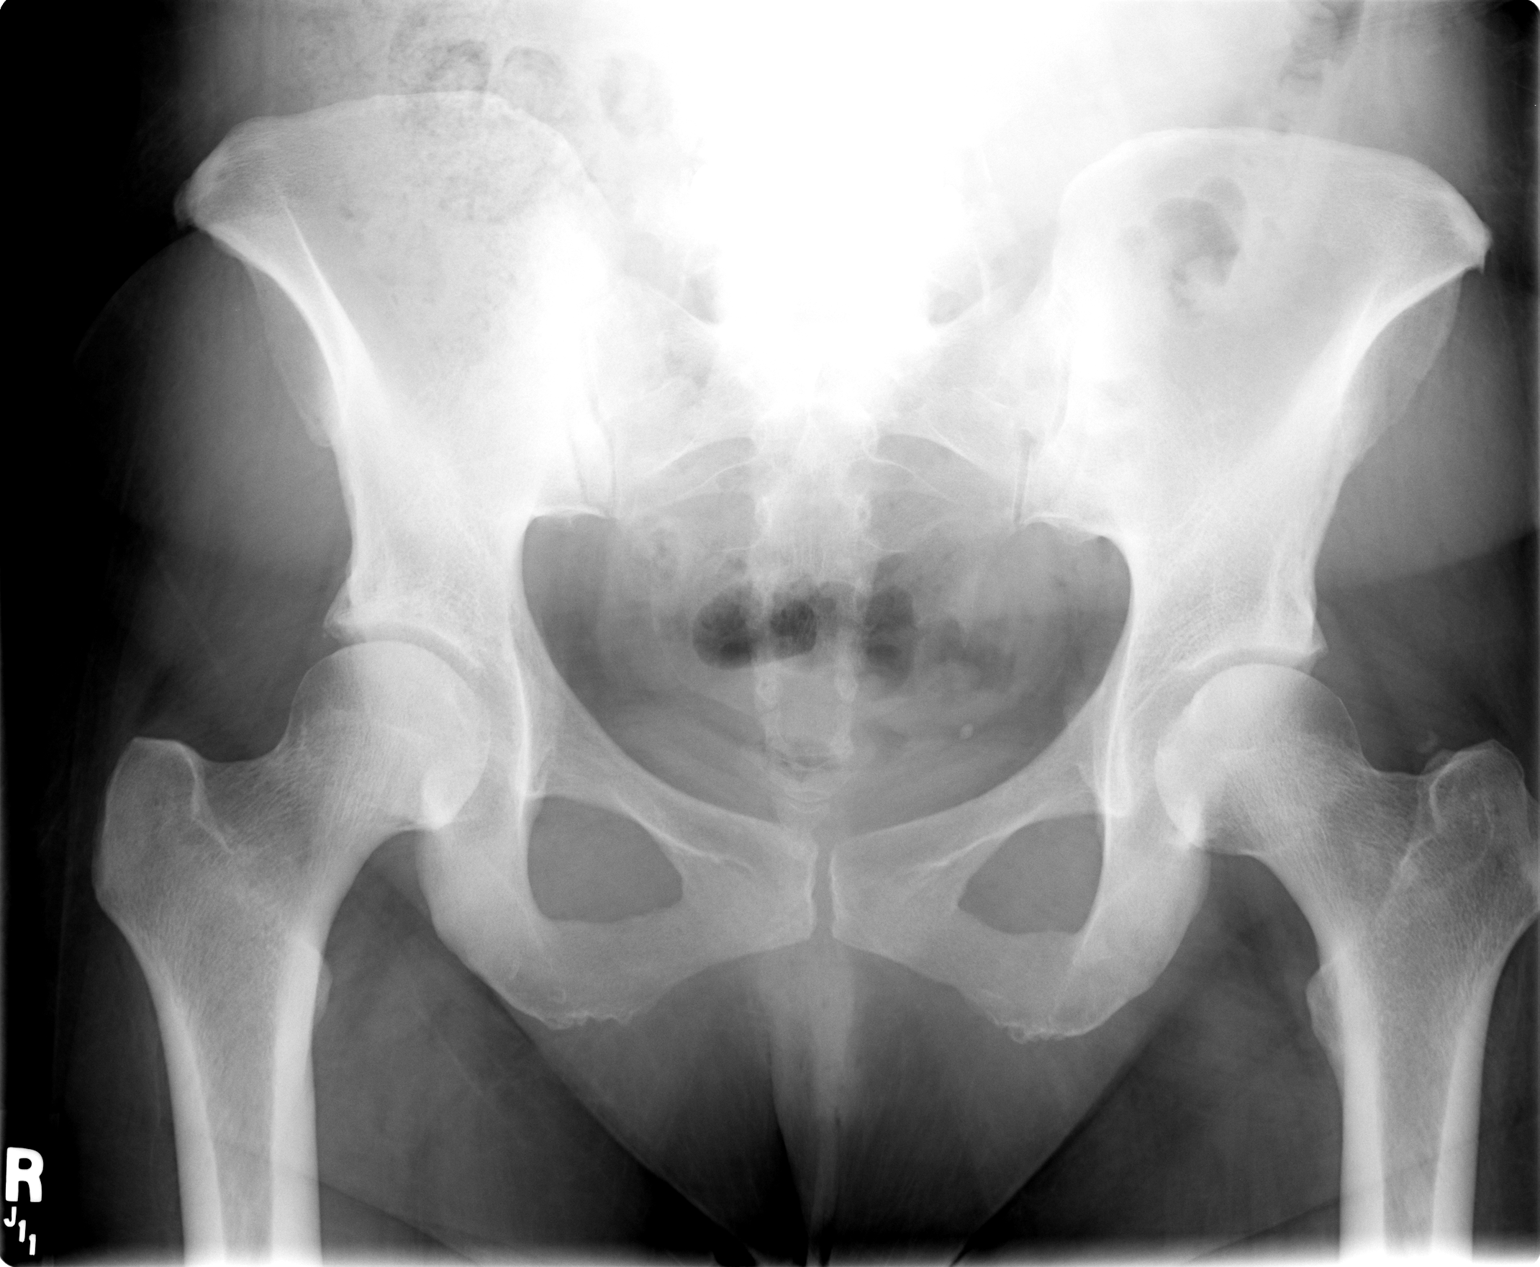

[1 of 1 positions shown; findings below may reference images not displayed]

## 2005-03-07 IMAGING — CR DG ELBOW COMPLETE 3+V*L*
5 series · 5 of 5 positions shown · non-contrast
Comparison: none

CLINICAL DATA: Fall.  Left arm, pelvic, and left thigh injury and pain.
 LEFT ELBOW FOUR VIEWS
 There is no evidence of fracture or dislocation.  There is no evidence of elbow joint effusion.  Degenerative spurring is seen involving the proximal ulna at the elbow joint.  Mild spurring is also noted along the lateral condyle of the distal humerus.
 IMPRESSION
 1.  No acute radiographic findings.
 2.  Degenerative spurring. 
 LEFT FOREARM TWO VIEWS
 There is no evidence of fracture or dislocation.  Degenerative spurring is again noted involving the proximal ulna at the elbow joint.
 1.  No evidence of fracture.
 2.  Degenerative spurring of the proximal ulna at the elbow joint.
 PELVIS ONE VIEW
 There is no evidence of pelvic fracture or diastasis.  No other pelvic bone lesions are seen.  Posterior pedicle screws and fixation rods are seen in the lower lumbar spine.
 No evidence of pelvic fracture.
 LEFT FEMUR TWO VIEWS
 Two tiny ossific densities are seen along the greater trochanter of the proximal femur.  These may represent acute or chronic avulsion fragments or other soft tissue ossification.  No other fractures are seen.  There is no evidence of hip dislocation.  
 Tiny ossific densities adjacent to the greater trochanter of the proximal femur, which could represent acute or chronic avulsion fragments or other soft tissue ossification.  Recommend clinical correlation for point tenderness at this site.

[view not recorded (1 of 5)]
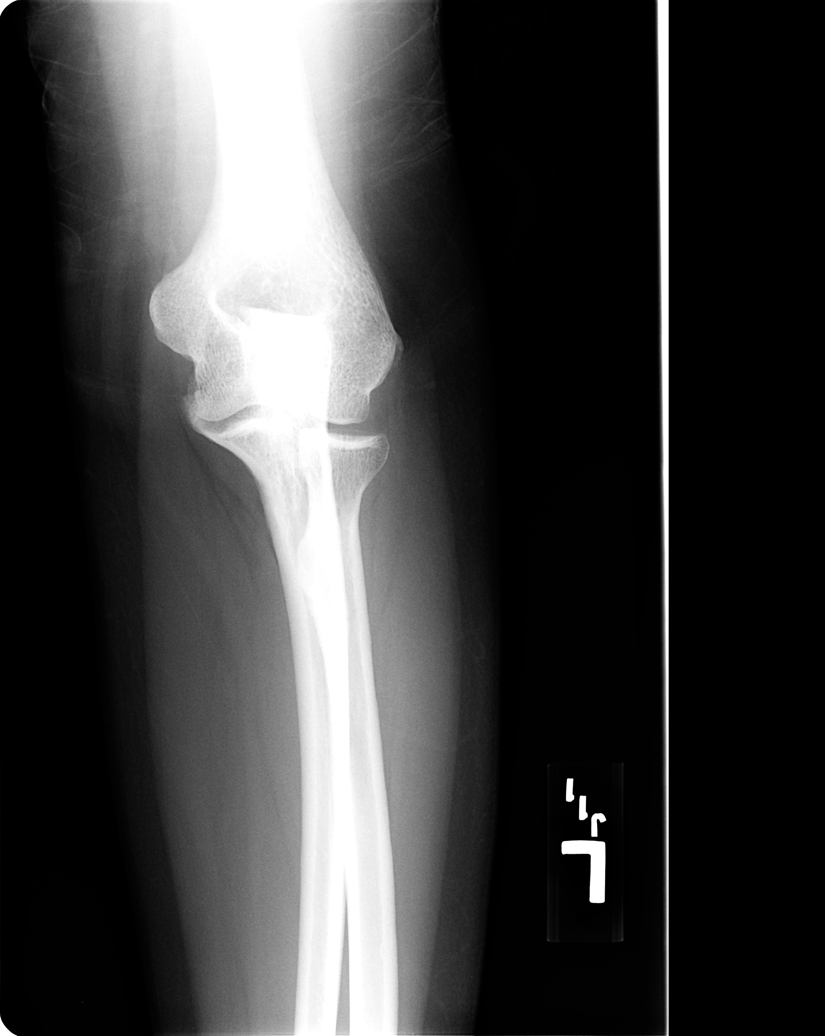

[view not recorded (2 of 5)]
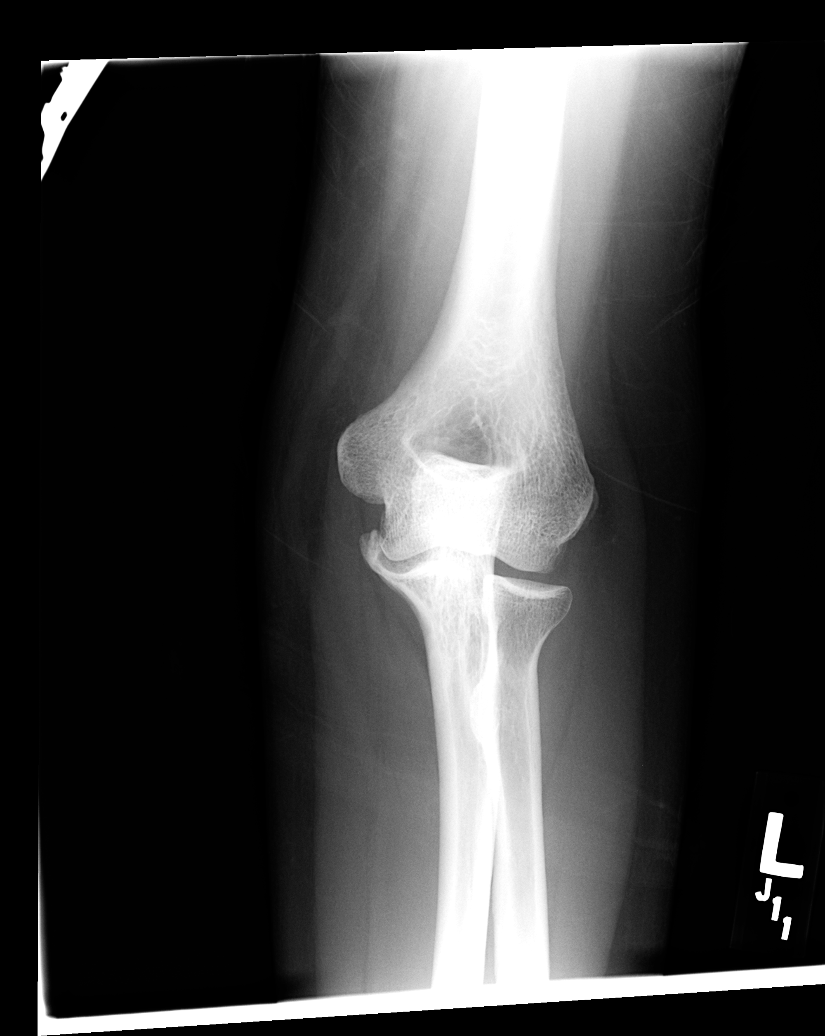

[view not recorded (3 of 5)]
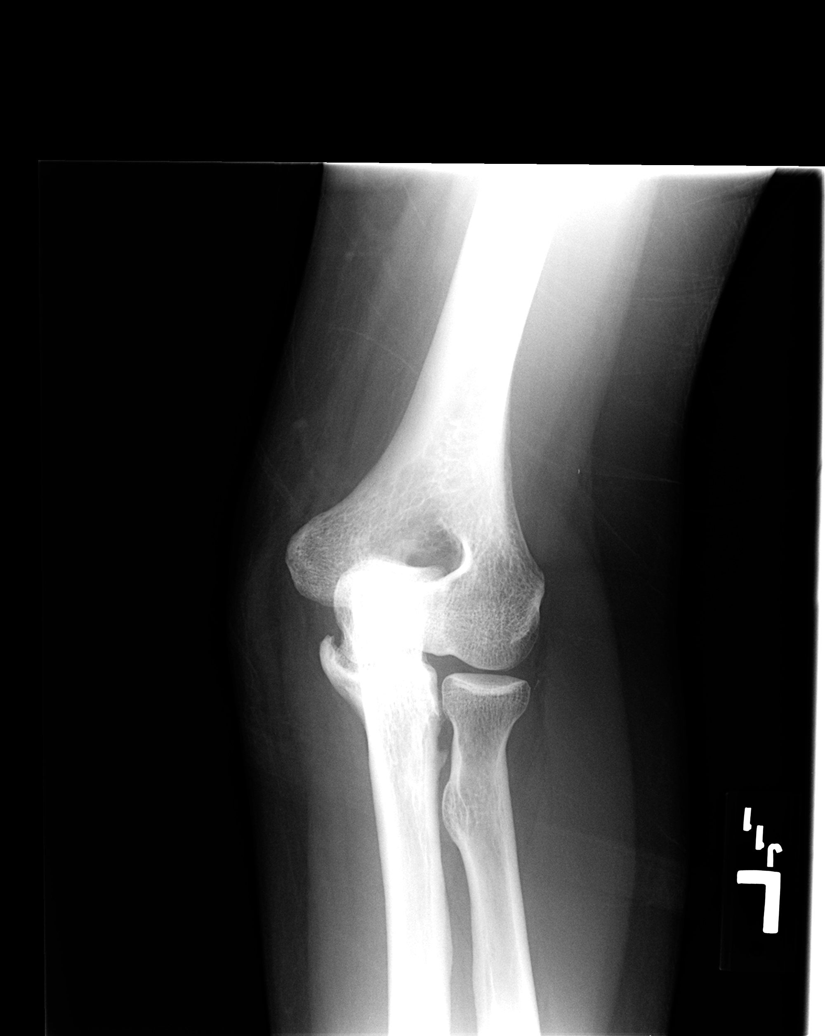

[view not recorded (4 of 5)]
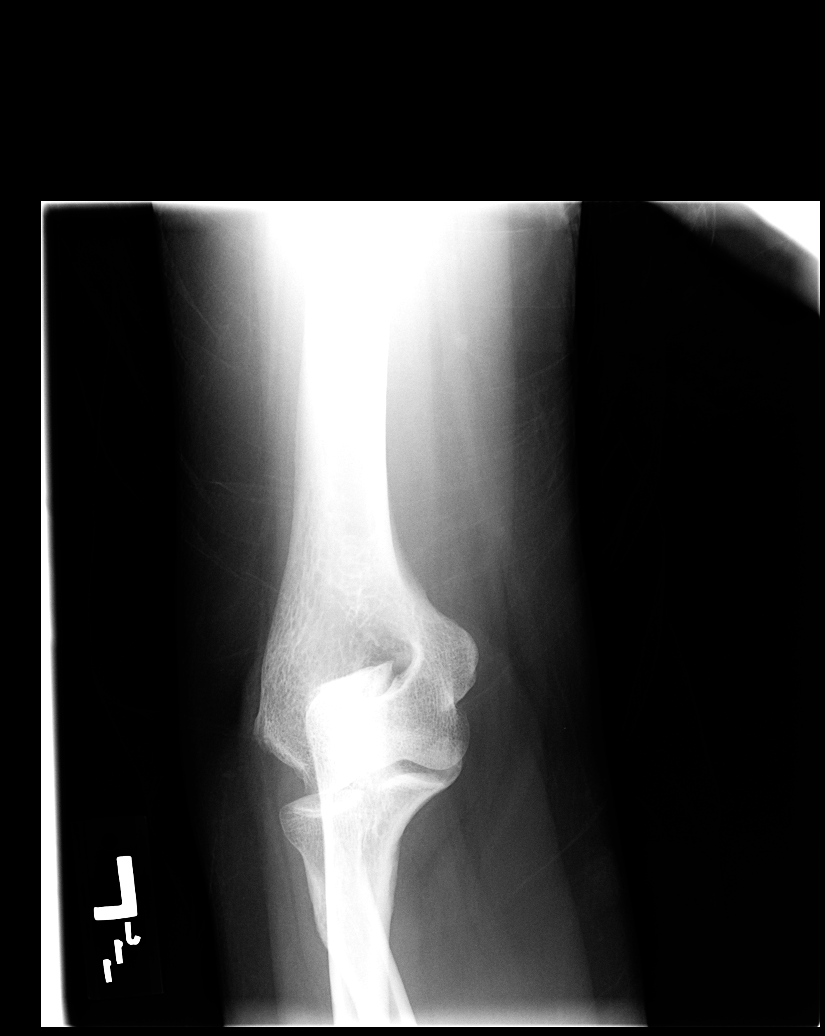

[view not recorded (5 of 5)]
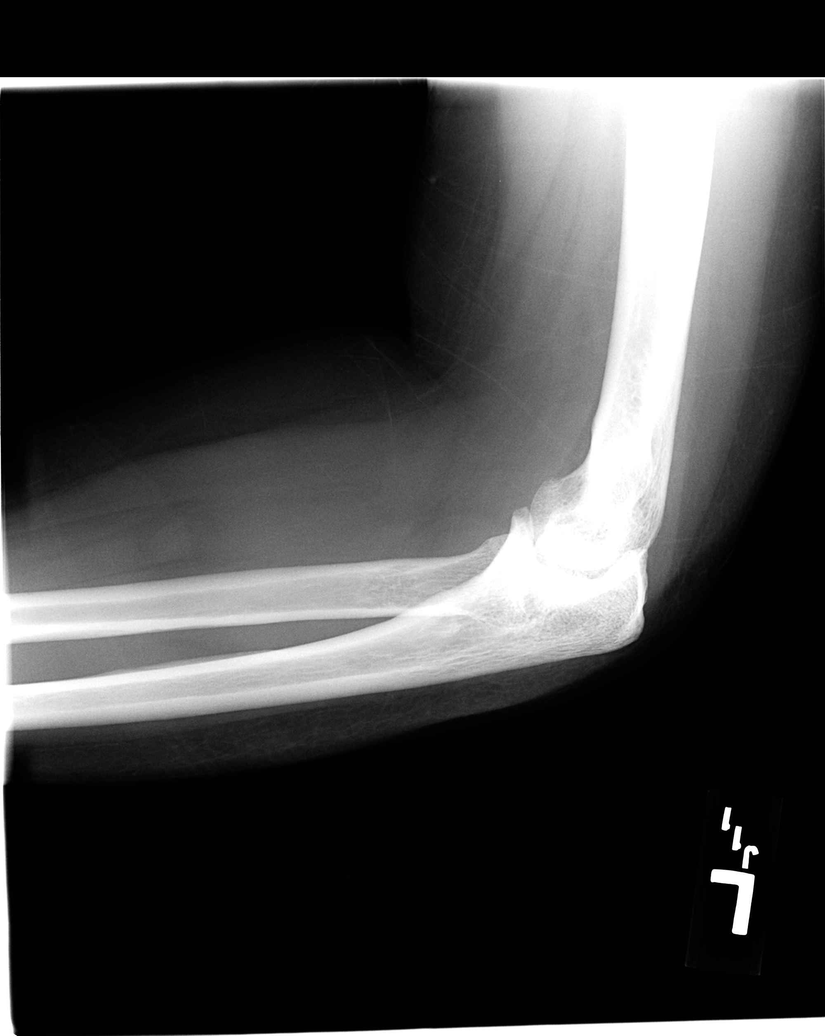

[5 of 5 positions shown; findings below may reference images not displayed]

## 2005-04-05 ENCOUNTER — Inpatient Hospital Stay (HOSPITAL_COMMUNITY): Admission: EM | Admit: 2005-04-05 | Discharge: 2005-04-08 | Payer: Self-pay | Admitting: Emergency Medicine

## 2005-04-14 ENCOUNTER — Inpatient Hospital Stay (HOSPITAL_COMMUNITY): Admission: EM | Admit: 2005-04-14 | Discharge: 2005-04-17 | Payer: Self-pay | Admitting: Emergency Medicine

## 2005-04-21 ENCOUNTER — Emergency Department (HOSPITAL_COMMUNITY): Admission: EM | Admit: 2005-04-21 | Discharge: 2005-04-22 | Payer: Self-pay | Admitting: *Deleted

## 2005-04-23 ENCOUNTER — Inpatient Hospital Stay (HOSPITAL_COMMUNITY): Admission: AD | Admit: 2005-04-23 | Discharge: 2005-04-25 | Payer: Self-pay | Admitting: Internal Medicine

## 2005-04-24 ENCOUNTER — Ambulatory Visit: Payer: Self-pay | Admitting: Internal Medicine

## 2005-05-02 ENCOUNTER — Inpatient Hospital Stay (HOSPITAL_COMMUNITY): Admission: EM | Admit: 2005-05-02 | Discharge: 2005-05-04 | Payer: Self-pay | Admitting: Emergency Medicine

## 2005-05-08 ENCOUNTER — Inpatient Hospital Stay (HOSPITAL_COMMUNITY): Admission: EM | Admit: 2005-05-08 | Discharge: 2005-05-10 | Payer: Self-pay | Admitting: Emergency Medicine

## 2005-05-19 ENCOUNTER — Inpatient Hospital Stay (HOSPITAL_COMMUNITY): Admission: EM | Admit: 2005-05-19 | Discharge: 2005-05-22 | Payer: Self-pay | Admitting: Emergency Medicine

## 2005-05-30 IMAGING — CR DG ABDOMEN 1V
1 series · 1 of 1 positions shown · non-contrast
Comparison: none

HISTORY: Diabetes, nausea, vomiting, question partial obstruction

[view not recorded]
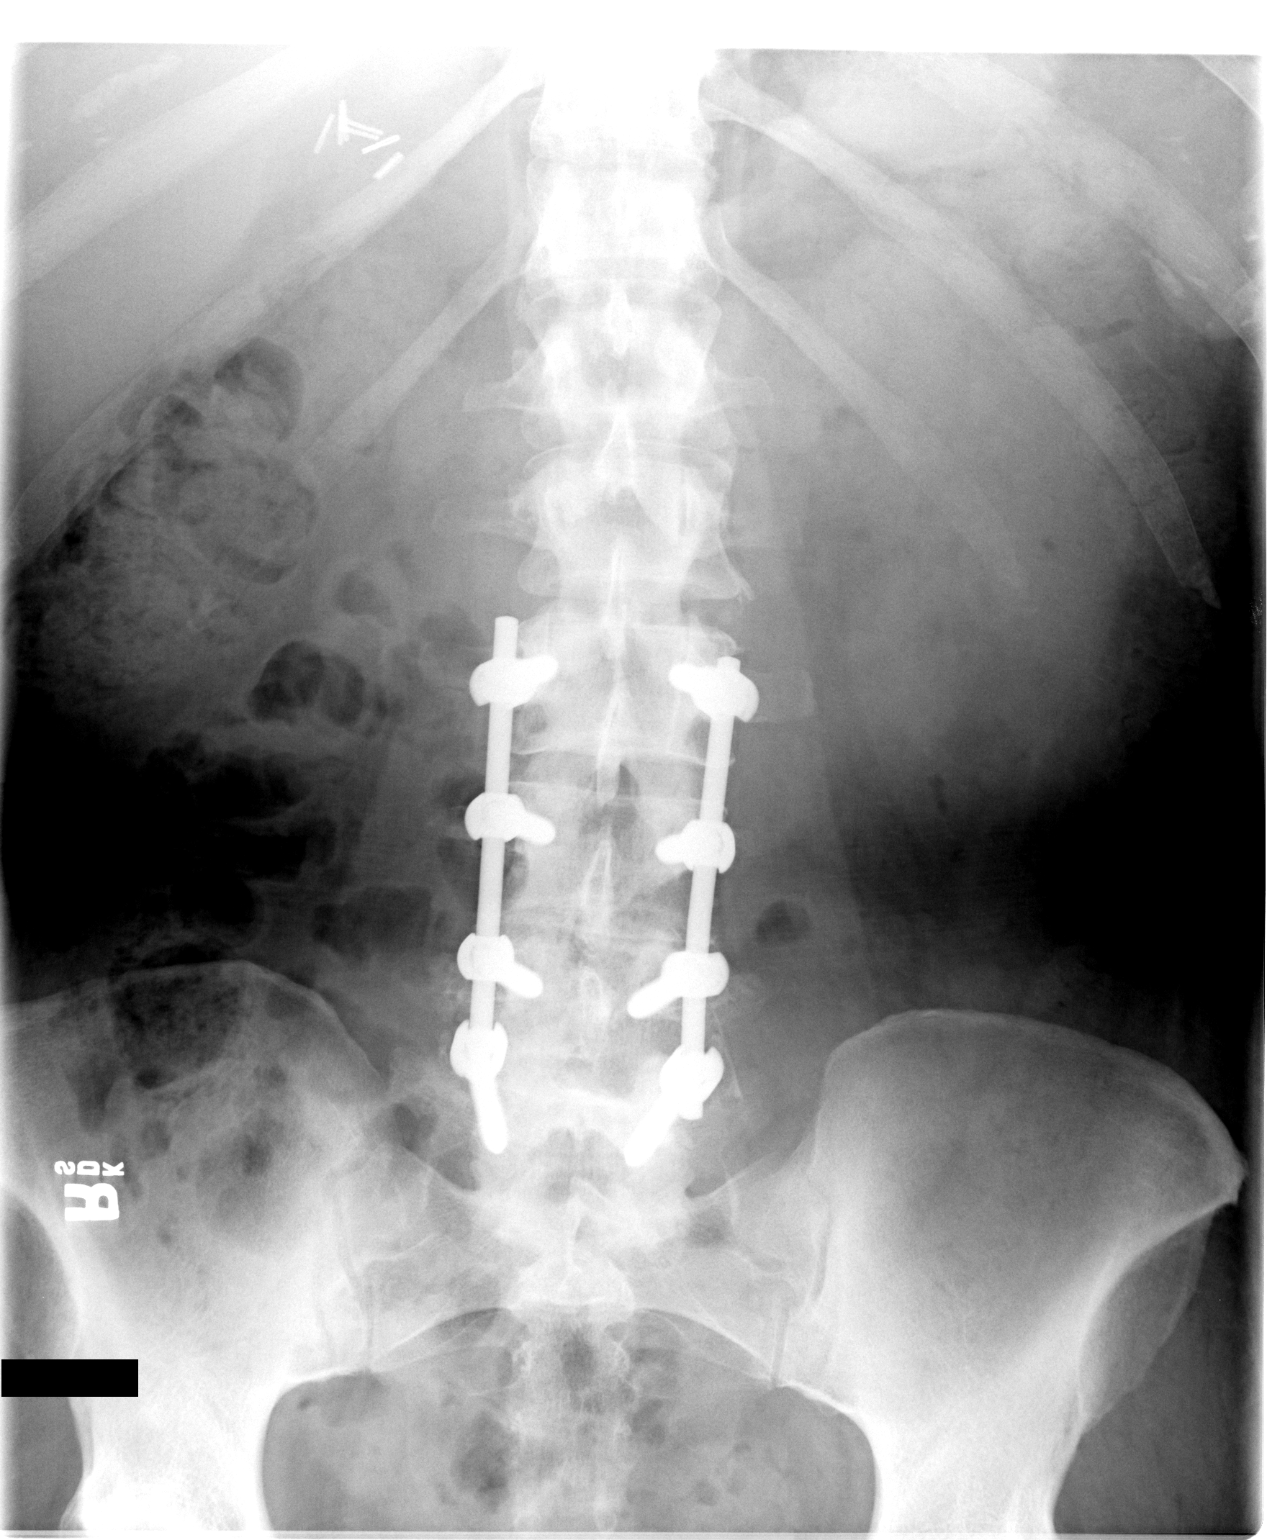

[1 of 1 positions shown; findings below may reference images not displayed]

ABDOMEN ONE VIEW

Scout film for small bowel follow-through performed.
Normal bowel gas pattern with stool in right colon.
No bowel dilatation or wall thickening.
Normal small bowel gas distribution.
Surgical clips right upper quadrant, question cholecystectomy.
Status post 4 level fusion, lower lumbar spine.
Patient refused contrast for small bowel follow-through.
IMPRESSION: Unremarkable abdominal film.

## 2005-06-04 ENCOUNTER — Ambulatory Visit (HOSPITAL_COMMUNITY): Payer: Self-pay | Admitting: Family Medicine

## 2005-06-04 ENCOUNTER — Encounter (HOSPITAL_COMMUNITY): Admission: RE | Admit: 2005-06-04 | Discharge: 2005-07-04 | Payer: Self-pay | Admitting: Family Medicine

## 2005-06-08 ENCOUNTER — Emergency Department (HOSPITAL_COMMUNITY): Admission: EM | Admit: 2005-06-08 | Discharge: 2005-06-08 | Payer: Self-pay | Admitting: Emergency Medicine

## 2005-06-27 ENCOUNTER — Ambulatory Visit (HOSPITAL_COMMUNITY): Admission: RE | Admit: 2005-06-27 | Discharge: 2005-06-27 | Payer: Self-pay | Admitting: Family Medicine

## 2005-06-28 ENCOUNTER — Emergency Department (HOSPITAL_COMMUNITY): Admission: EM | Admit: 2005-06-28 | Discharge: 2005-06-28 | Payer: Self-pay | Admitting: Emergency Medicine

## 2005-07-08 IMAGING — CR DG ABDOMEN ACUTE W/ 1V CHEST
3 series · 3 of 3 positions shown · non-contrast
Comparison: none

HISTORY: Abdominal pain, vomiting

[view not recorded (1 of 3)]
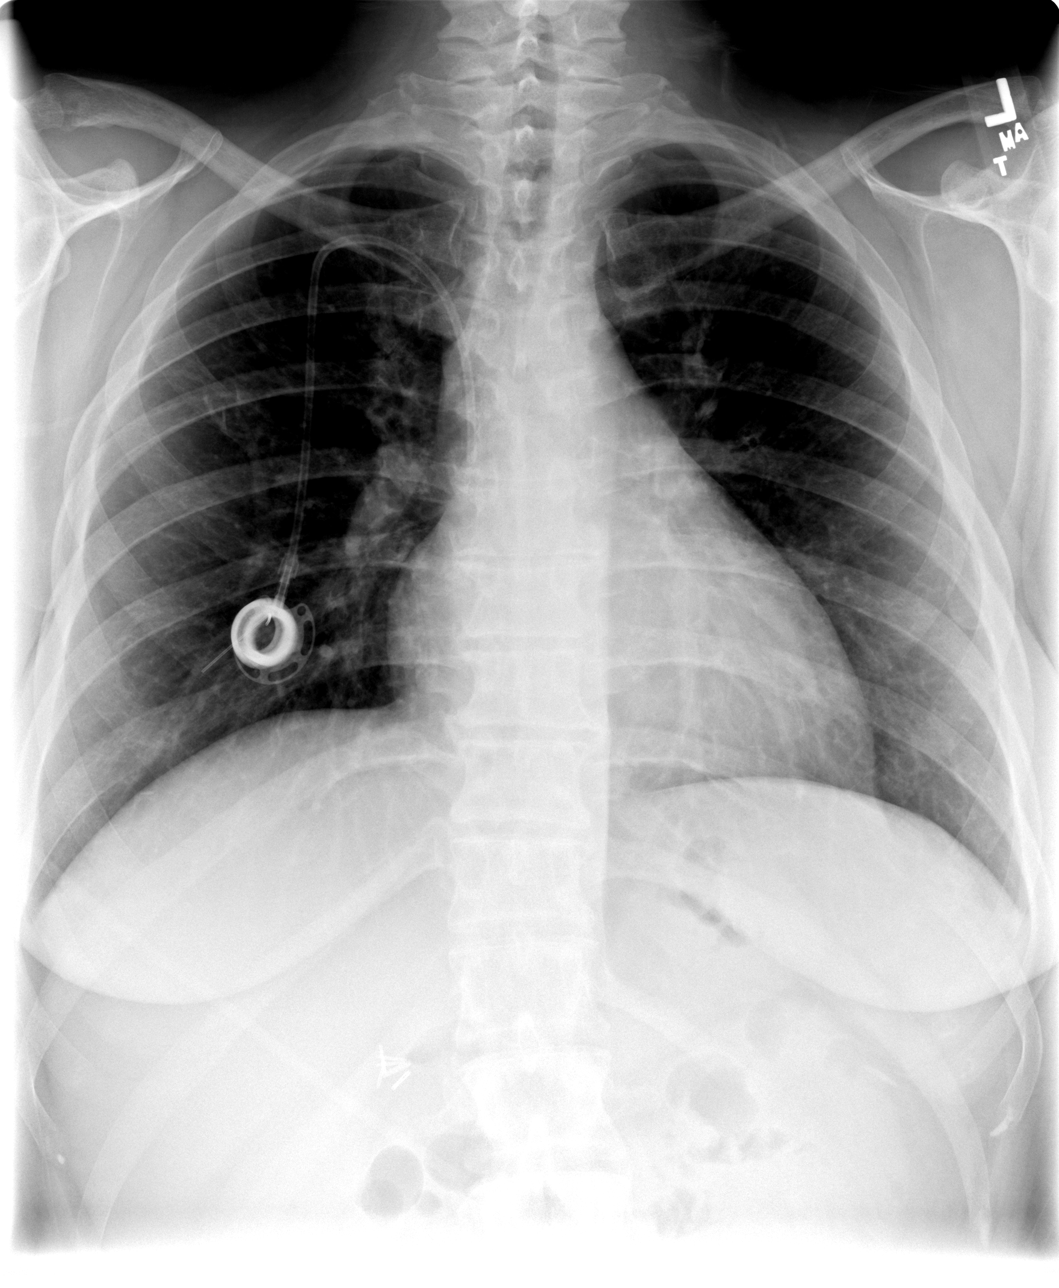

[view not recorded (2 of 3)]
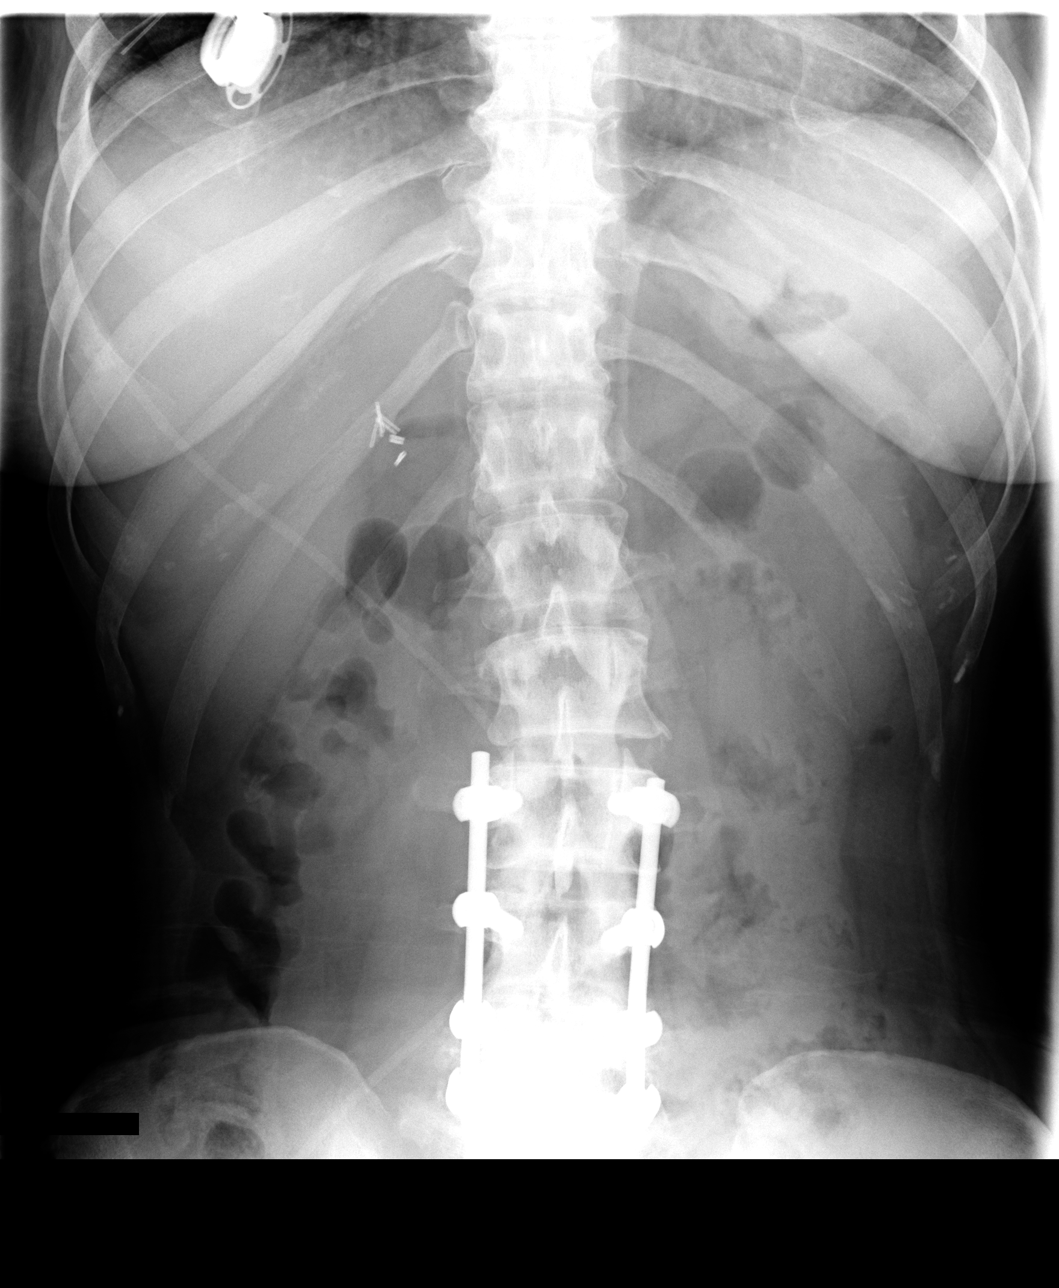

[view not recorded (3 of 3)]
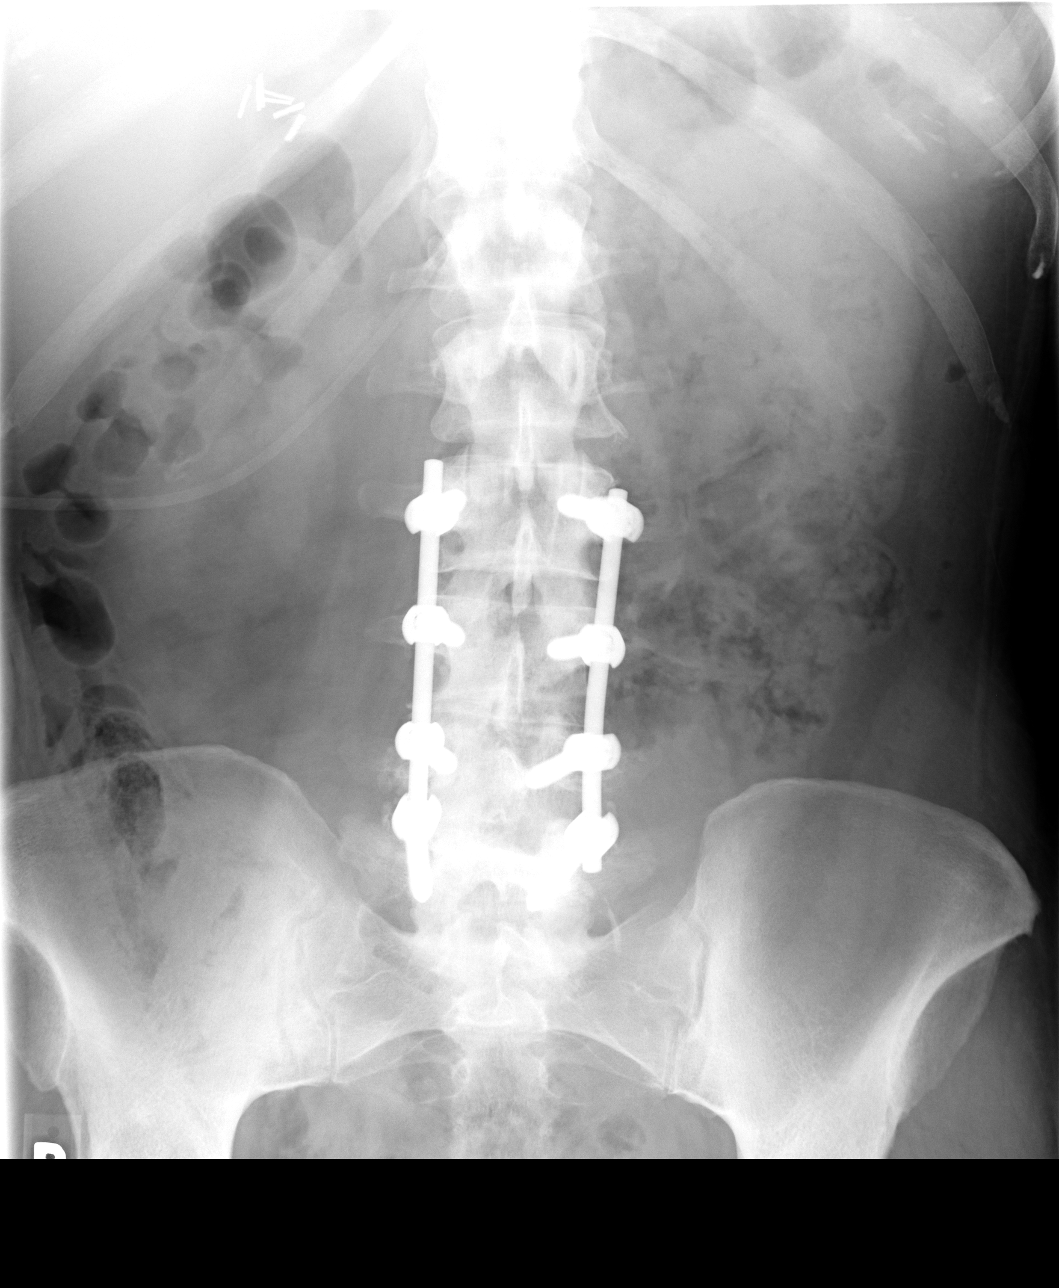

[3 of 3 positions shown; findings below may reference images not displayed]

ABDOMEN ACUTE WITH PA CHEST:

Comparison 06/21/2004

Cardiomegaly.
Mediastinal contours and vascularity normal.
Right subclavian Port-A-Cath stable.
Lungs clear.

Surgical clips right upper quadrant, likely cholecystectomy.
Prior 4-level lumbar fusion.
Normal bowel gas pattern without signs of obstruction, wall thickening, or
perforation.
No acute bone lesion or urinary tract calcification.
Small left pelvic phlebolith stable.
IMPRESSION: Cardiomegaly. No acute abdominal findings.

## 2005-07-22 ENCOUNTER — Emergency Department (HOSPITAL_COMMUNITY): Admission: EM | Admit: 2005-07-22 | Discharge: 2005-07-22 | Payer: Self-pay | Admitting: Emergency Medicine

## 2005-07-28 IMAGING — CR DG ABDOMEN ACUTE W/ 1V CHEST
4 series · 4 of 4 positions shown · non-contrast
Comparison: 07/10/04.

CLINICAL DATA: Nausea and vomiting.    Increased bowel sounds.

[view not recorded (1 of 4)]
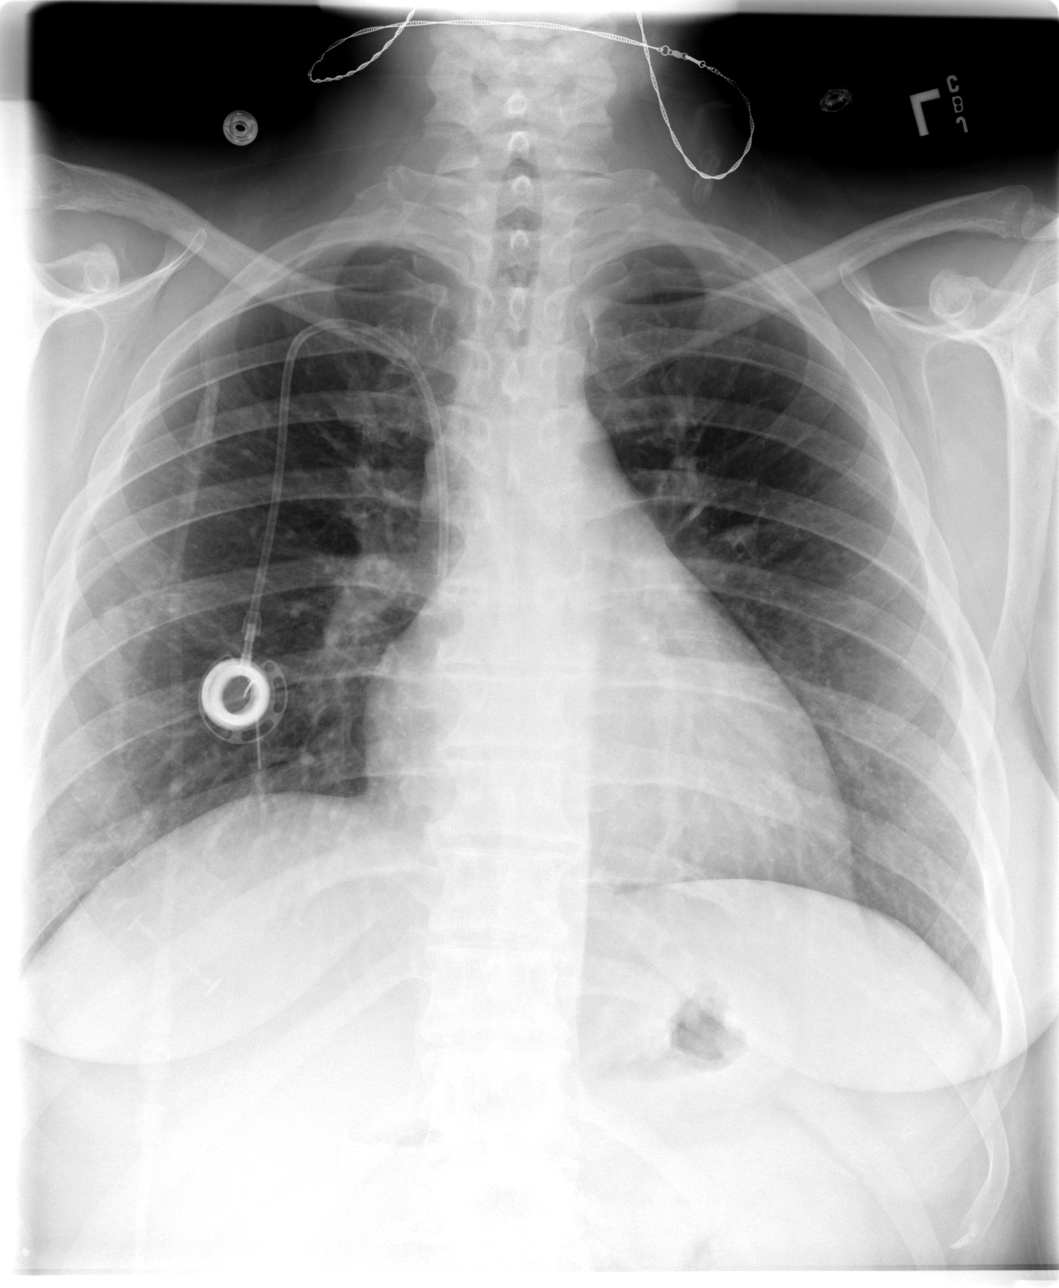

[view not recorded (2 of 4)]
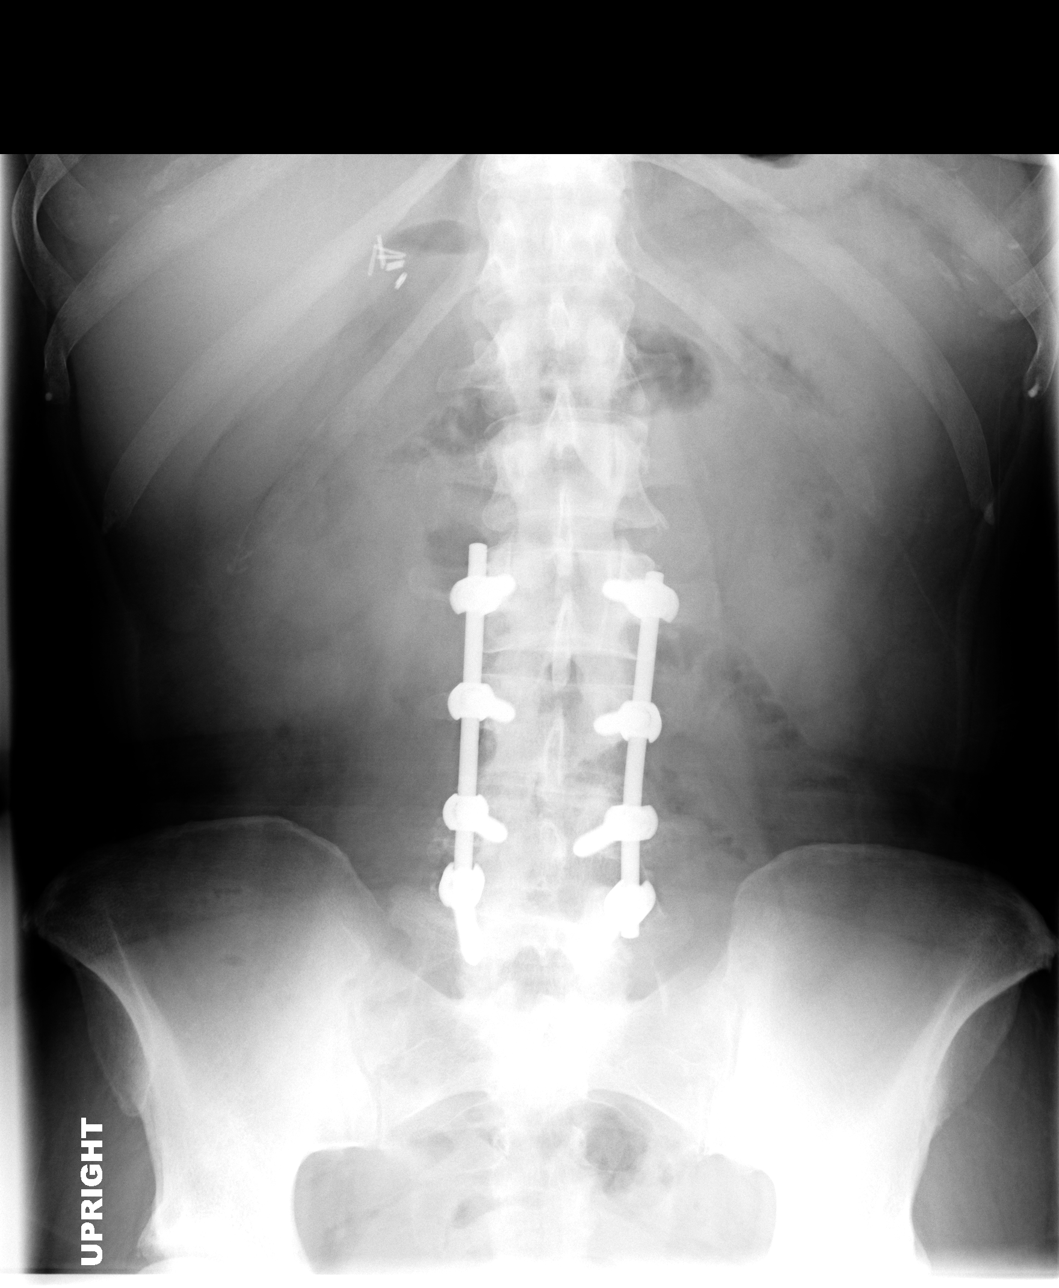

[view not recorded (3 of 4)]
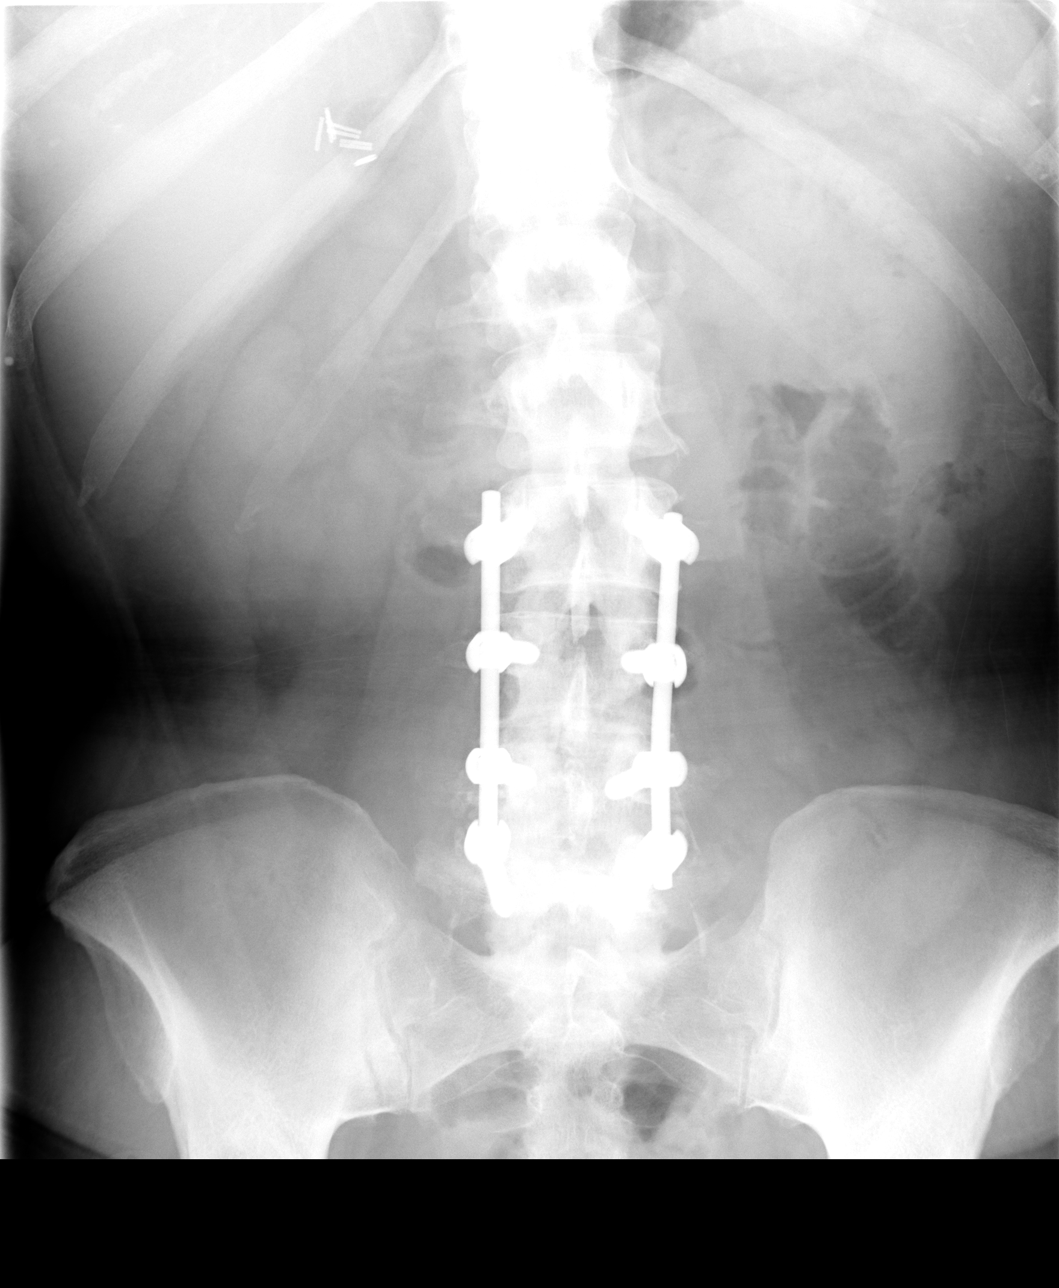

[view not recorded (4 of 4)]
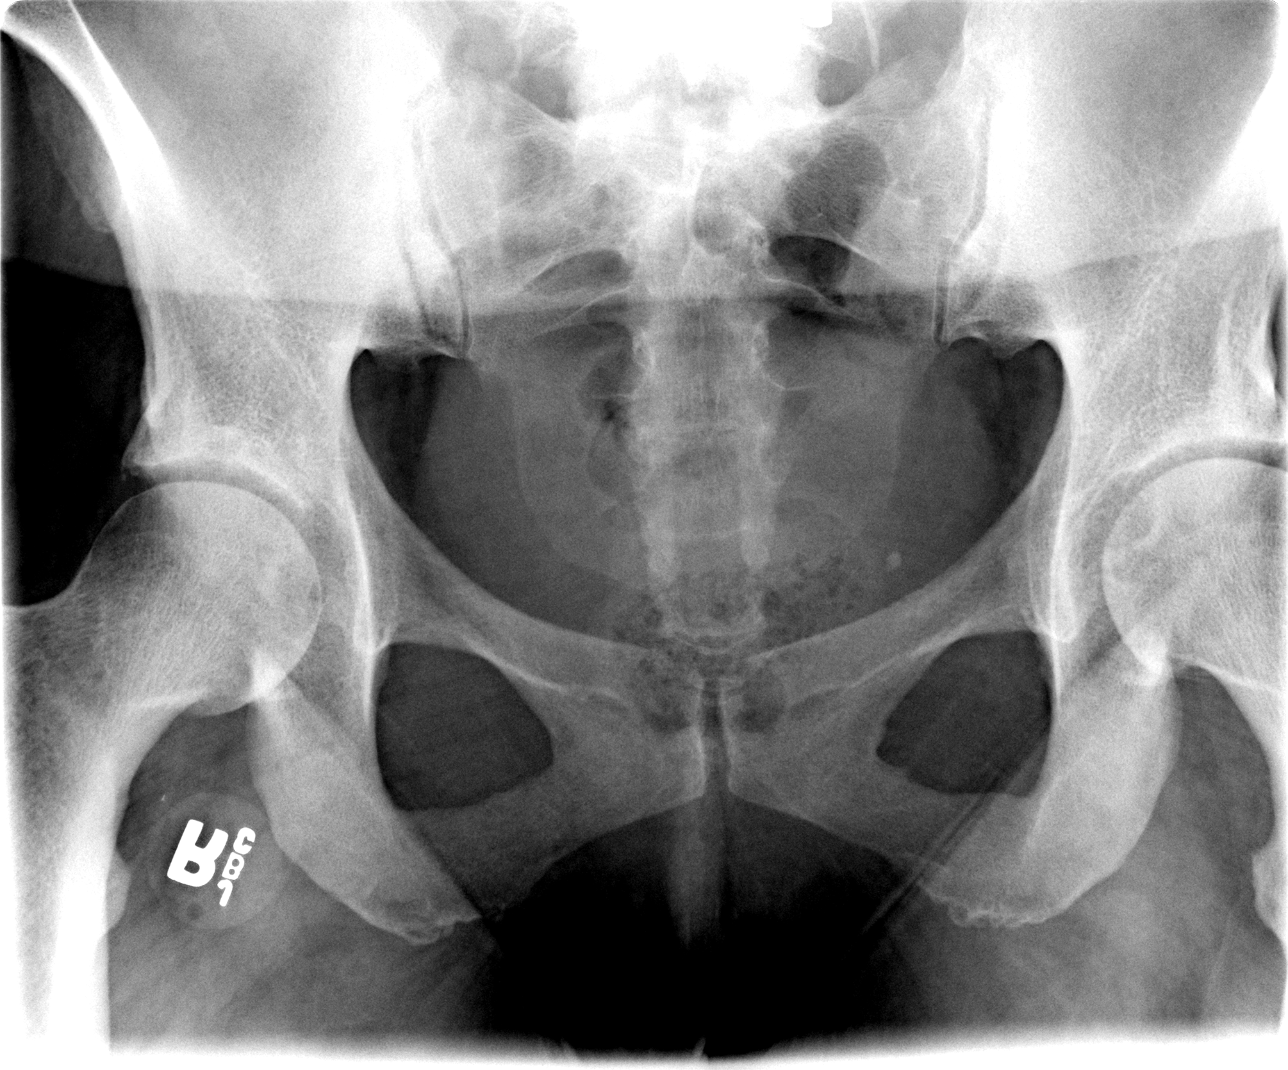

[4 of 4 positions shown; findings below may reference images not displayed]

ACUTE ABDOMEN SERIES WITH PA CHEST:
 Port-A-Cath appears in good position.  The heart size and vascularity are normal and the lungs are clear.  There is only a minimal amount of air in non-distended loops of bowel.  No free air.  Harrington rods are seen in the lumbar spine.  Phlebolith is present in the left side of the pelvis.
IMPRESSION: Benign appearing abdomen and chest.

## 2005-07-30 IMAGING — CT CT PELVIS W/ CM
1 of 3 series · 13 of 32 positions shown, 18 images · IV contrast (agent unspecified)
Comparison: CT abdomen and pelvis 10/06/2003.

CLINICAL DATA: Gastroparesis, diabetes. Nausea and vomiting. Abdominal pain.
Possible mass in the gallbladder fossa by report of outside ultrasound at [facility]
[[facility]] [facility] (not currently available to me.) 

CT ABDOMEN AND PELVIS WITH CONTRAST 08/02/2004:
TECHNIQUE: Multidetector helical CT of the abdomen and pelvis was performed
during bolus administration of intravenous contrast. Oral contrast was not
given. Delayed imaging through the kidneys was performed.
Contrast:  100 cc [id number].

[Series [id number]: — · axial · 0.67mm/px · z∈[+1258,+1748]mm · 13 of 112 slices shown, 18 images]
[im 7/112  soft-tissue]
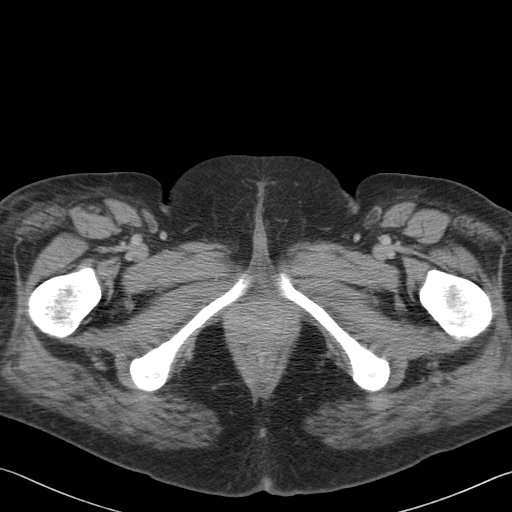
[im 7/112  bone]
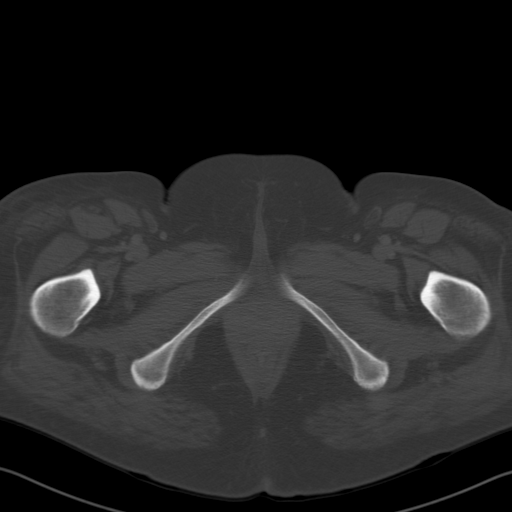
[im 14/112  soft-tissue]
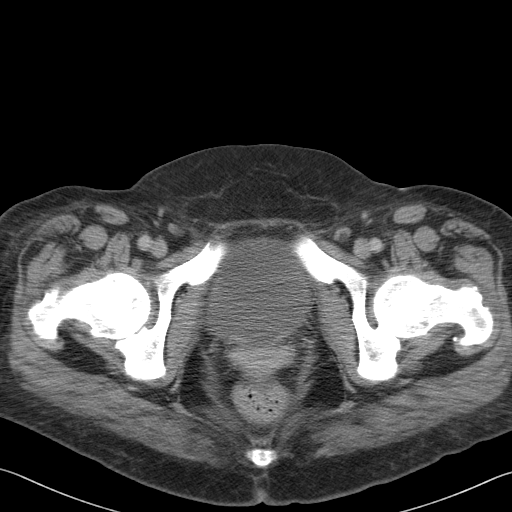
[im 28/112  soft-tissue]
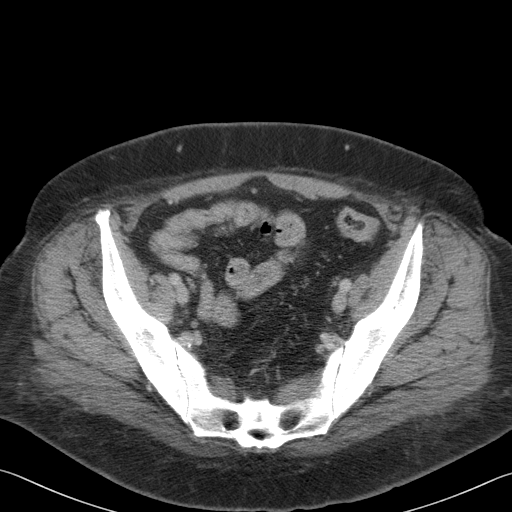
[im 35/112  soft-tissue]
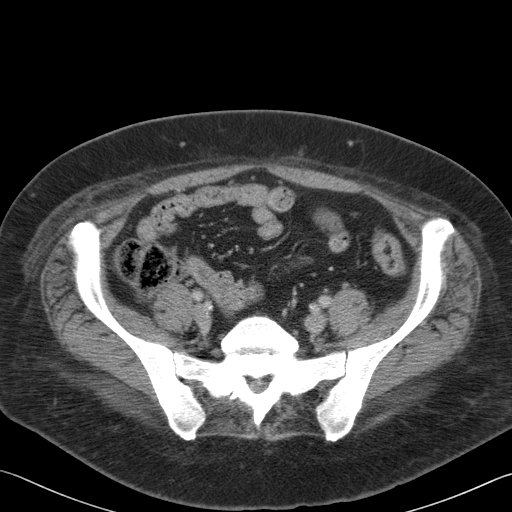
[im 42/112  soft-tissue]
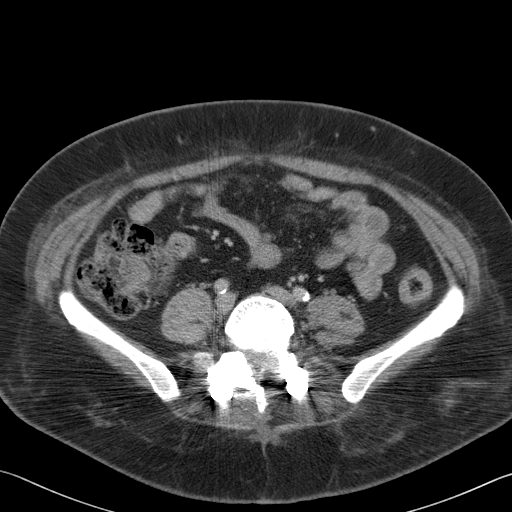
[im 49/112  soft-tissue]
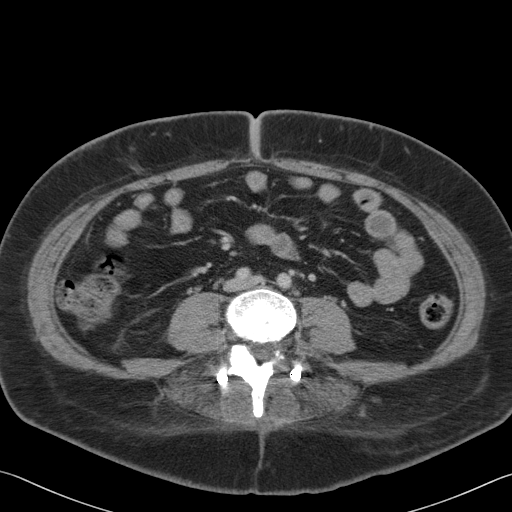
[im 63/112  soft-tissue]
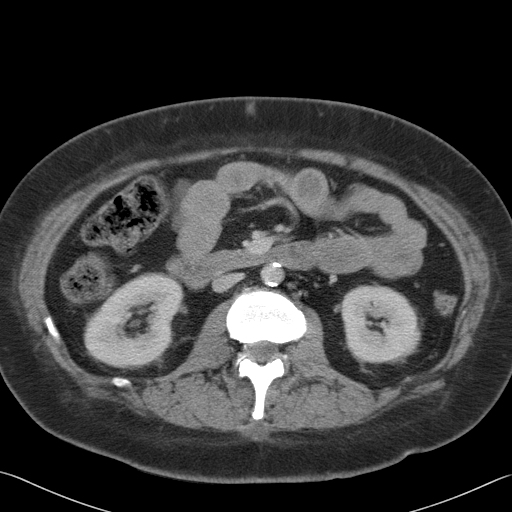
[im 70/112  soft-tissue]
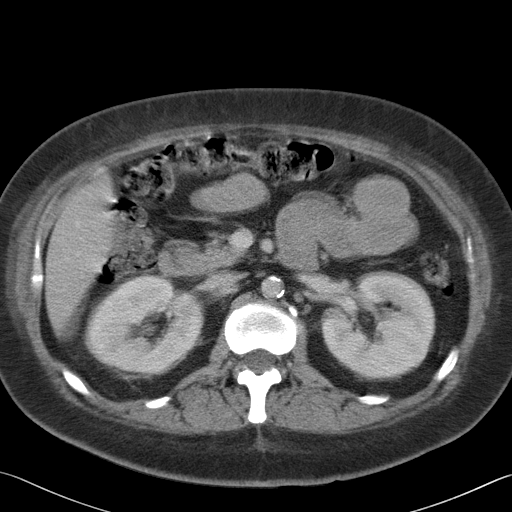
[im 77/112  soft-tissue]
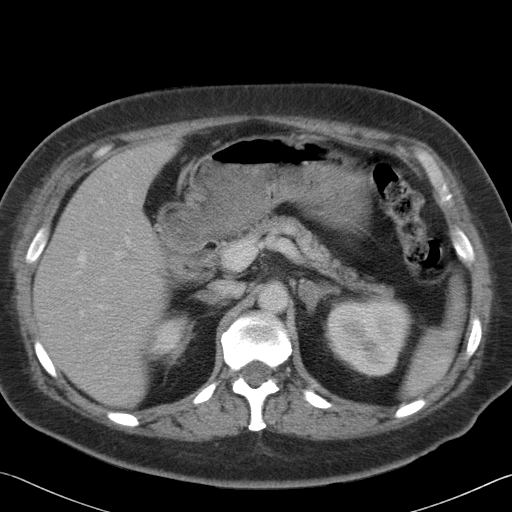
[im 77/112  bone]
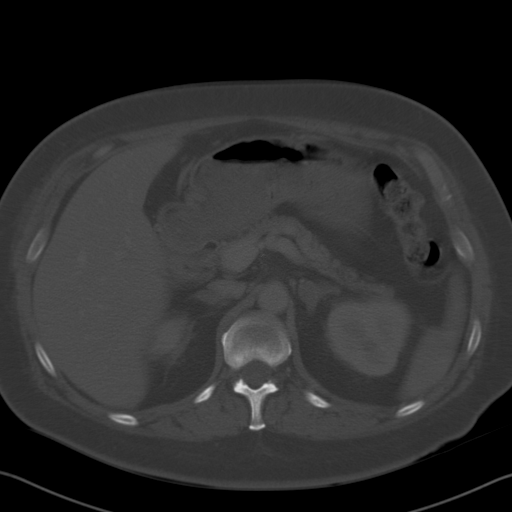
[im 84/112  soft-tissue]
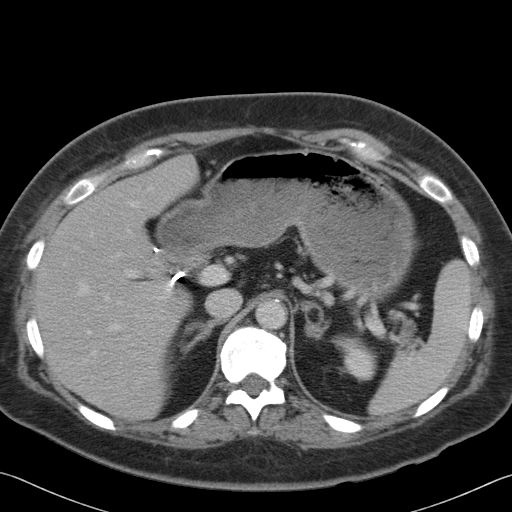
[im 84/112  lung]
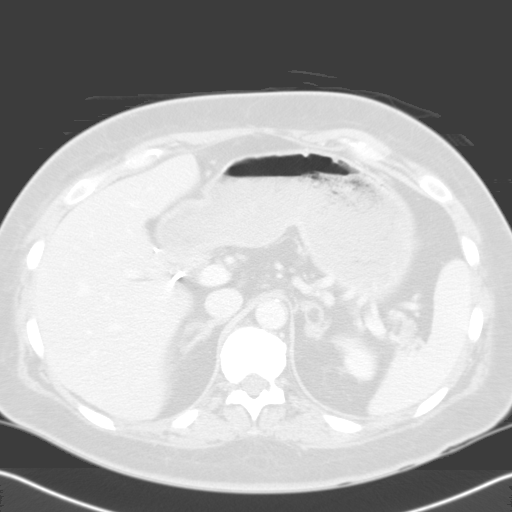
[im 91/112  lung]
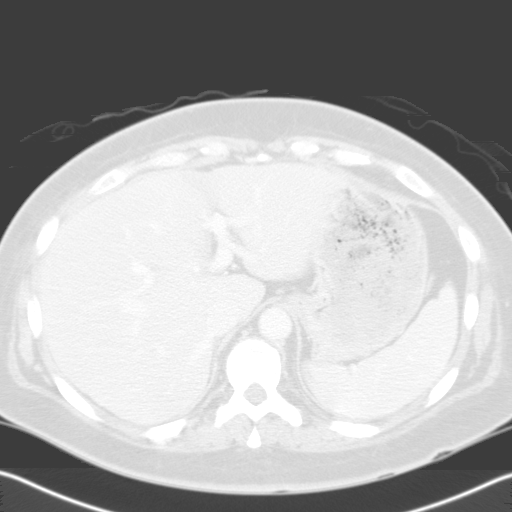
[im 98/112  soft-tissue]
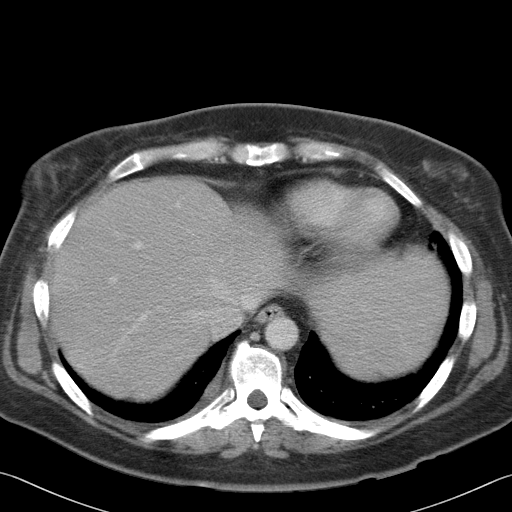
[im 98/112  lung]
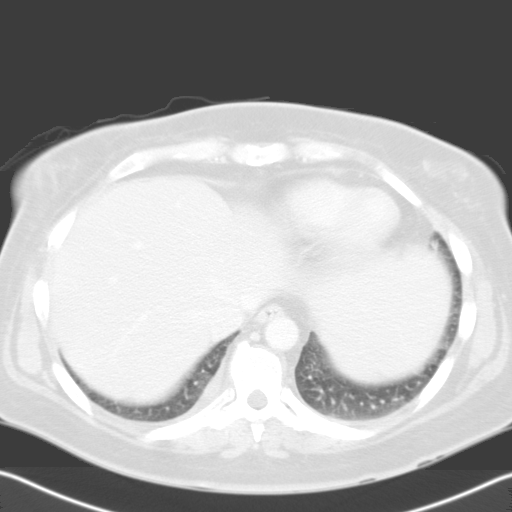
[im 105/112  soft-tissue]
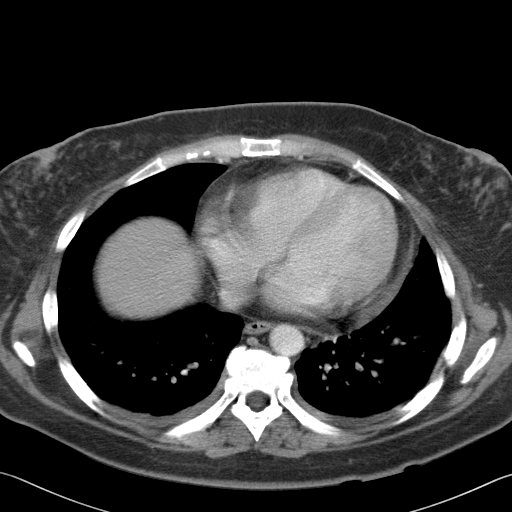
[im 105/112  lung]
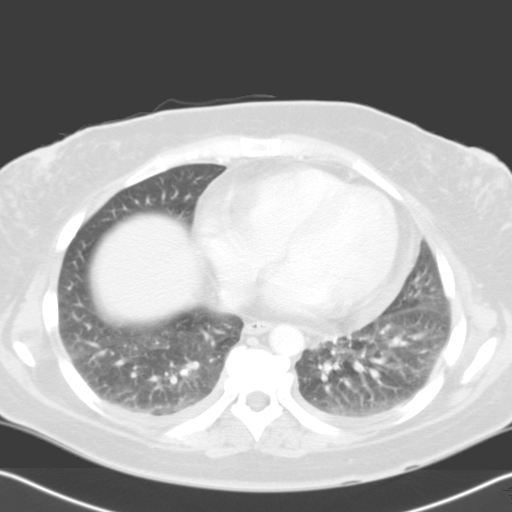

[13 of 32 positions shown; findings below may reference images not displayed]

CT ABDOMEN:

The liver and spleen are unremarkable; there is a focus of accessory splenic
tissue inferior to the lower pole of the spleen. The pancreas is atrophic but
otherwise unremarkable and is unchanged. The gallbladder is surgically absent.
There is no mass in the gallbladder fossa; the gastric antrum does extend into
the region of the gallbladder fossa and this may account for the abnormality
questioned on the outside report. There is no biliary ductal dilation. The
stomach is distended with food and fluid. The colon and small bowel are
unremarkable in the upper abdomen.

Again demonstrated is the small myelolipoma arising from the left adrenal gland
which measures approximately 1.7 x 1.3 cm, unchanged. The second left adrenal
nodule more inferiorly is also unchanged, measuring approximately 1.7 x 1.6 cm;
this is most consistent with an adenoma. The right adrenal gland is normal in
appearance. Both kidneys are normal. Advanced aortic atherosclerosis is present
for age, there is no evidence of aneurysm. There is no significant
lymphadenopathy. There is no free fluid. Images of the lung bases demonstrates
mild bibasilar atelectasis with crowding of the vessels. The heart is mildly
enlarged but stable.
IMPRESSION: 1. No acute abnormalities in the abdomen.

2. No gallbladder fossa mass as questioned on the outside report. The gastric
antrum does extend into the gallbladder fossa and likely accounts for the
presumed abnormality.

3. Marked gastric distention with food and fluid consistent with the diagnosis
of gastroparesis.

4. Stable left adrenal myelolipoma and adenoma.

5. Advanced aortic atherosclerosis for age.

6. Mild bibasilar lung atelectasis. Stable mild cardiomegaly.

CT PELVIS:

Iliofemoral atherosclerosis is present, advanced for age. The uterus and adnexa
are unremarkable for age; small bilateral ovarian cysts are present. There is a
small amount of fluid in the cul-de-sac which is presumably physiologic. The
urinary bladder is normal. There is no significant lymphadenopathy. The colon
small bowel are unremarkable. Lower lumbar fusion again noted. The appendix is
identified in the mid pelvis, extending to the left of midline, and is normal.
IMPRESSION: 1. No acute abnormalities in the pelvis.

2. Small amount of free fluid in the cul-de-sac, presumably physiologic.

3. Small bilateral ovarian cysts.

4. Iliofemoral atherosclerosis which is advanced for age.

## 2005-08-07 IMAGING — US US EXTREM LOW VENOUS*L*
1 series · 14 of 23 positions shown · non-contrast
Comparison: none

HISTORY: Left leg pain, diabetes

ULTRASOUND VENOUS DOPPLER IMAGING LEFT LOWER EXTREMITY:
Deep venous system patent and compressible from groin through popliteal fossa.
Spontaneous venous flow present with intact augmentation and evidence of
respiratory phasicity. 
No intraluminal thrombus identified.

[Series 1: unknown · 14 of 23 slices shown]
[im 1/23]
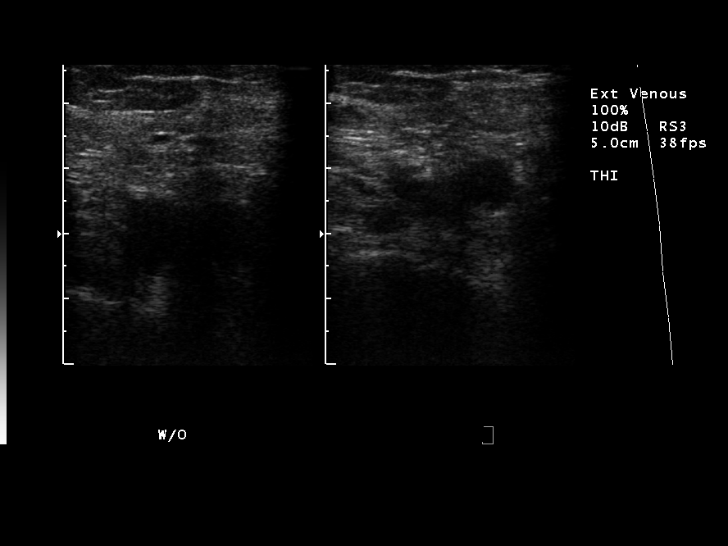
[im 3/23]
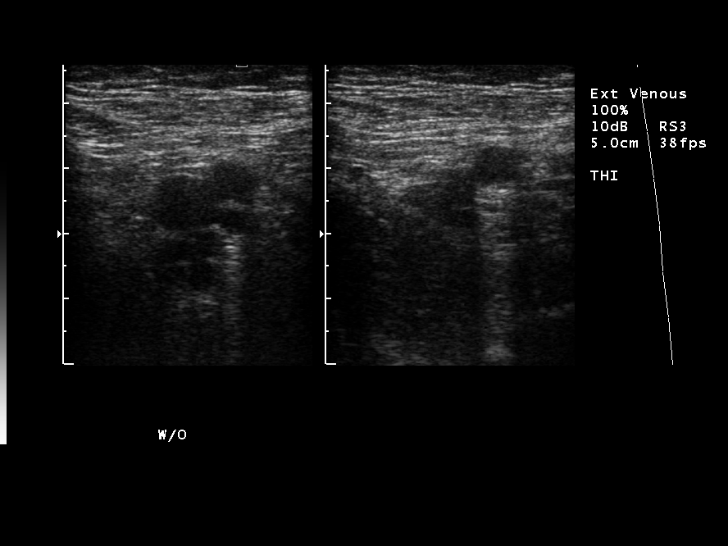
[im 5/23]
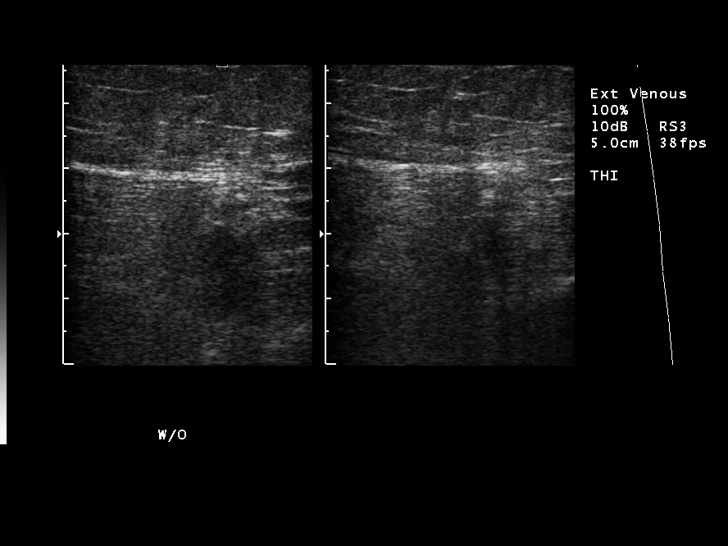
[im 6/23]
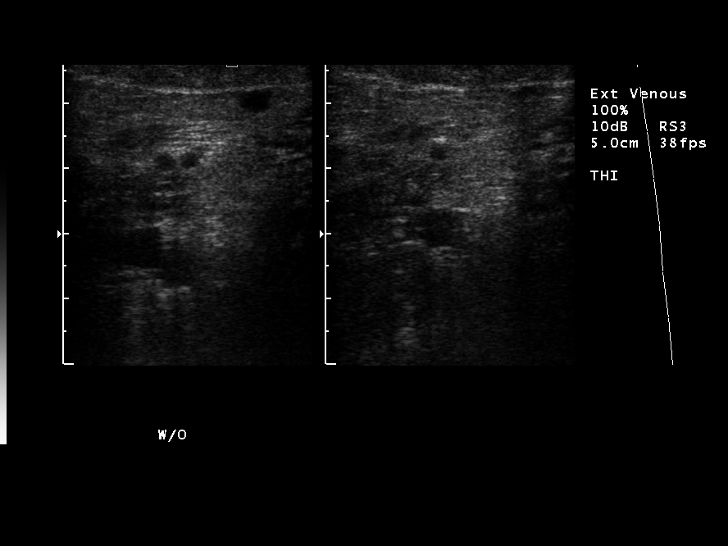
[im 8/23]
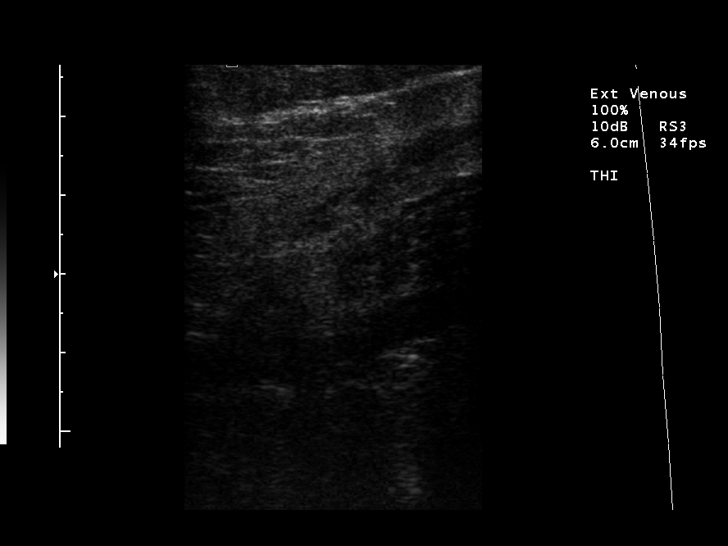
[im 10/23]
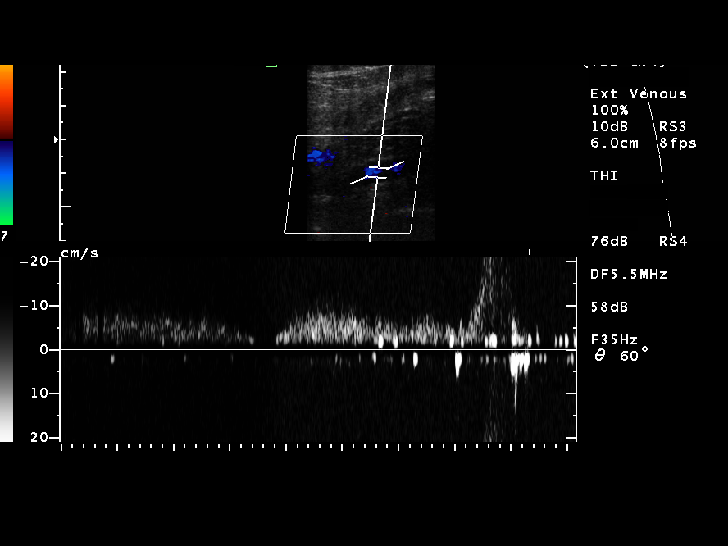
[im 11/23]
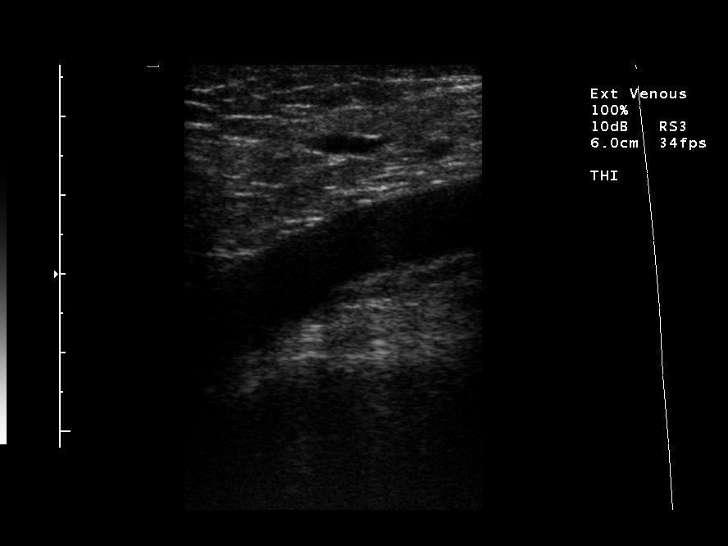
[im 13/23]
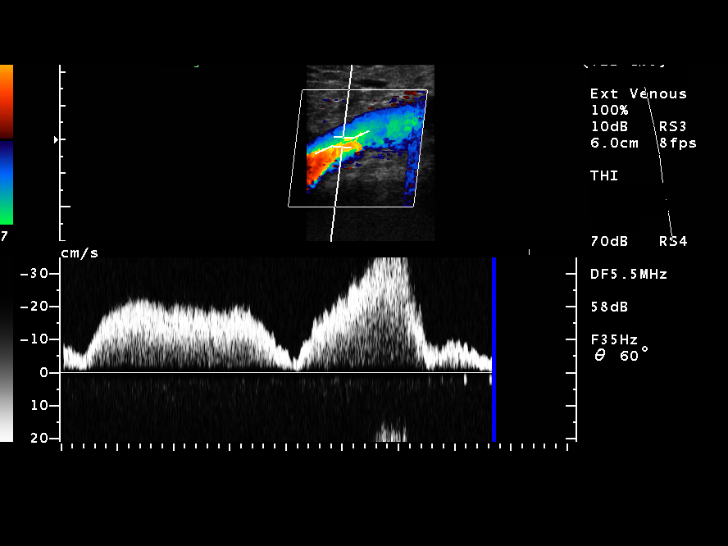
[im 14/23]
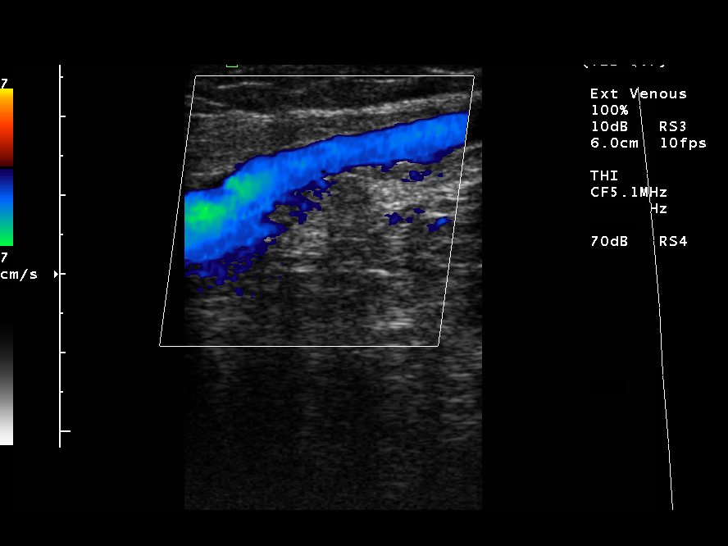
[im 16/23]
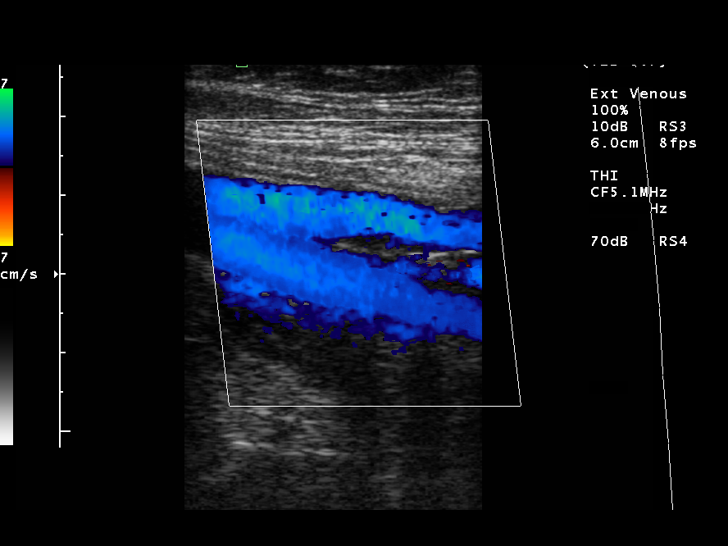
[im 18/23]
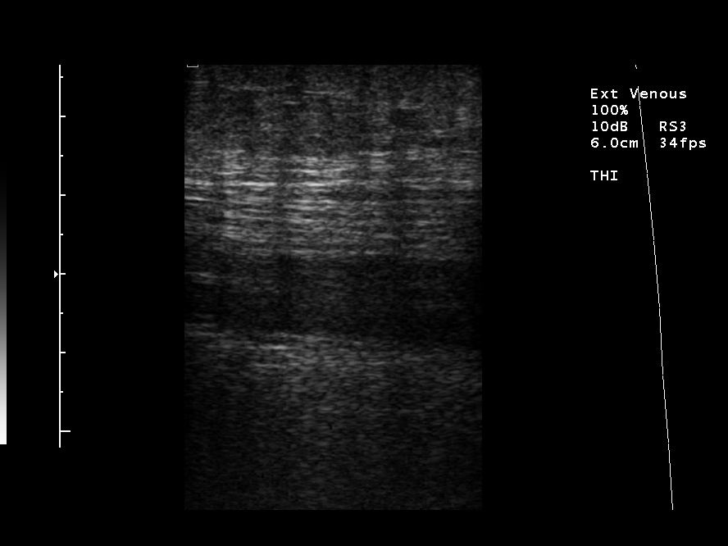
[im 19/23]
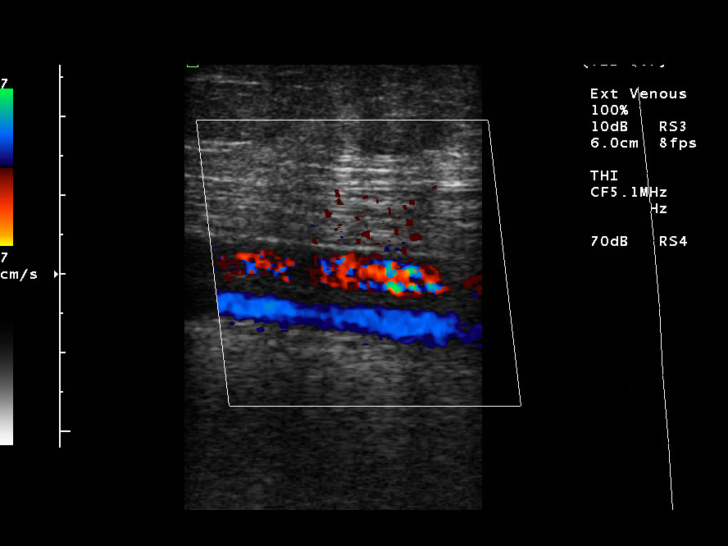
[im 21/23]
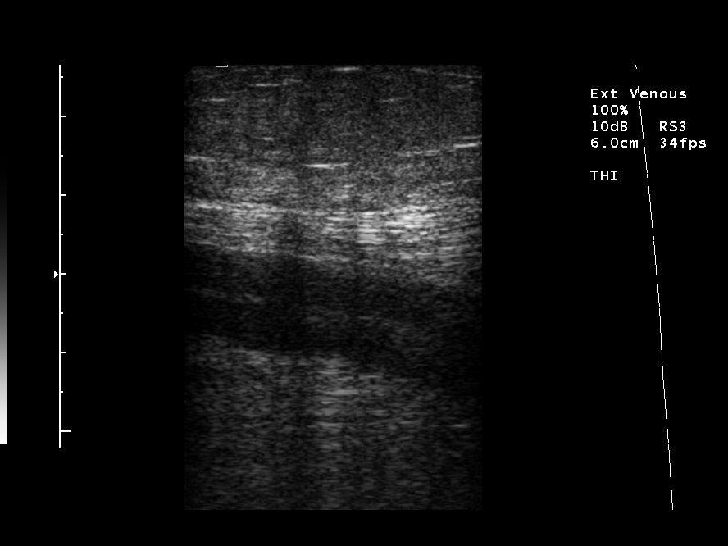
[im 23/23]
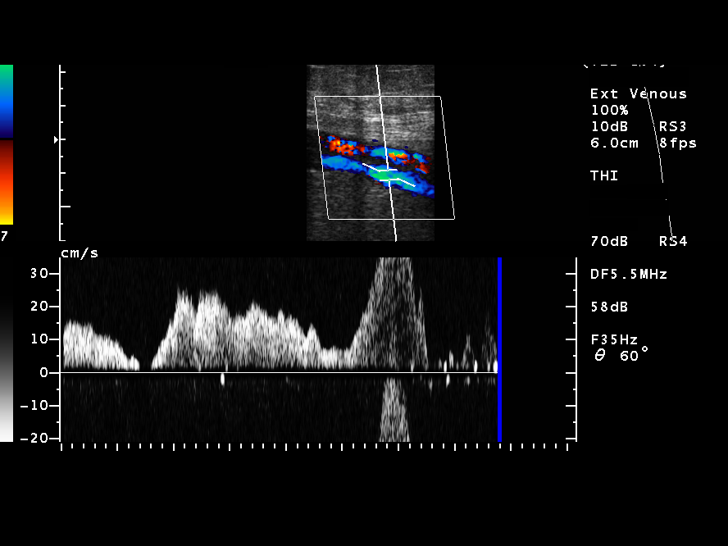

[14 of 23 positions shown; findings below may reference images not displayed]

IMPRESSION: No evidence of deep venous thrombosis.

## 2005-08-08 ENCOUNTER — Ambulatory Visit: Payer: Self-pay | Admitting: Cardiology

## 2005-08-10 ENCOUNTER — Inpatient Hospital Stay (HOSPITAL_BASED_OUTPATIENT_CLINIC_OR_DEPARTMENT_OTHER): Admission: RE | Admit: 2005-08-10 | Discharge: 2005-08-10 | Payer: Self-pay | Admitting: Cardiology

## 2005-08-10 ENCOUNTER — Ambulatory Visit: Payer: Self-pay | Admitting: Cardiology

## 2005-08-26 IMAGING — CR DG ABDOMEN ACUTE W/ 1V CHEST
3 series · 3 of 3 positions shown · non-contrast
Comparison: none

HISTORY: Chest pain, nausea, vomiting

[view not recorded (1 of 3)]
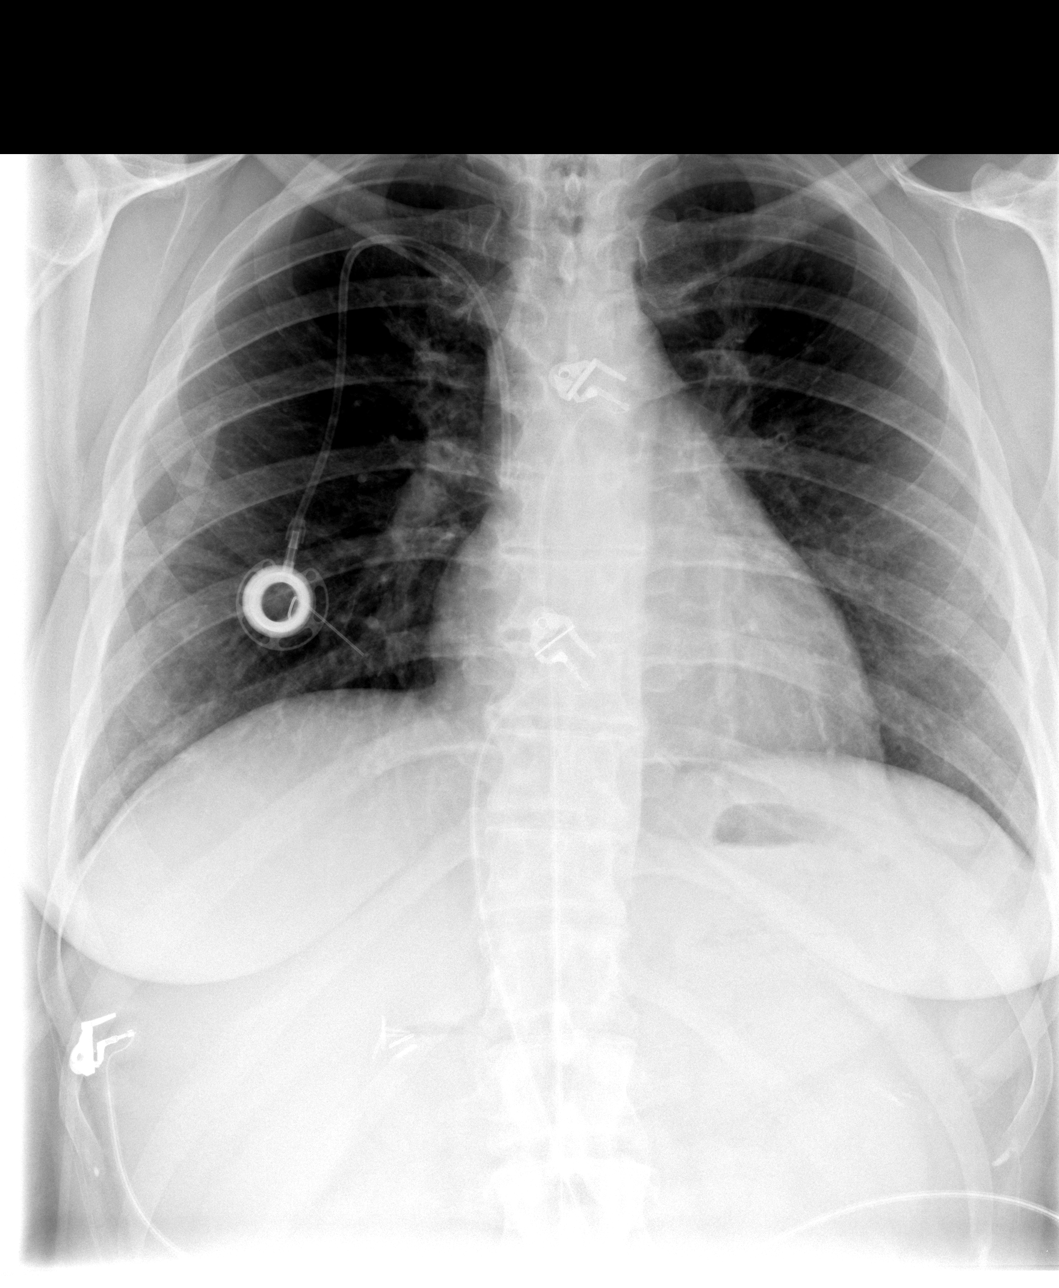

[view not recorded (2 of 3)]
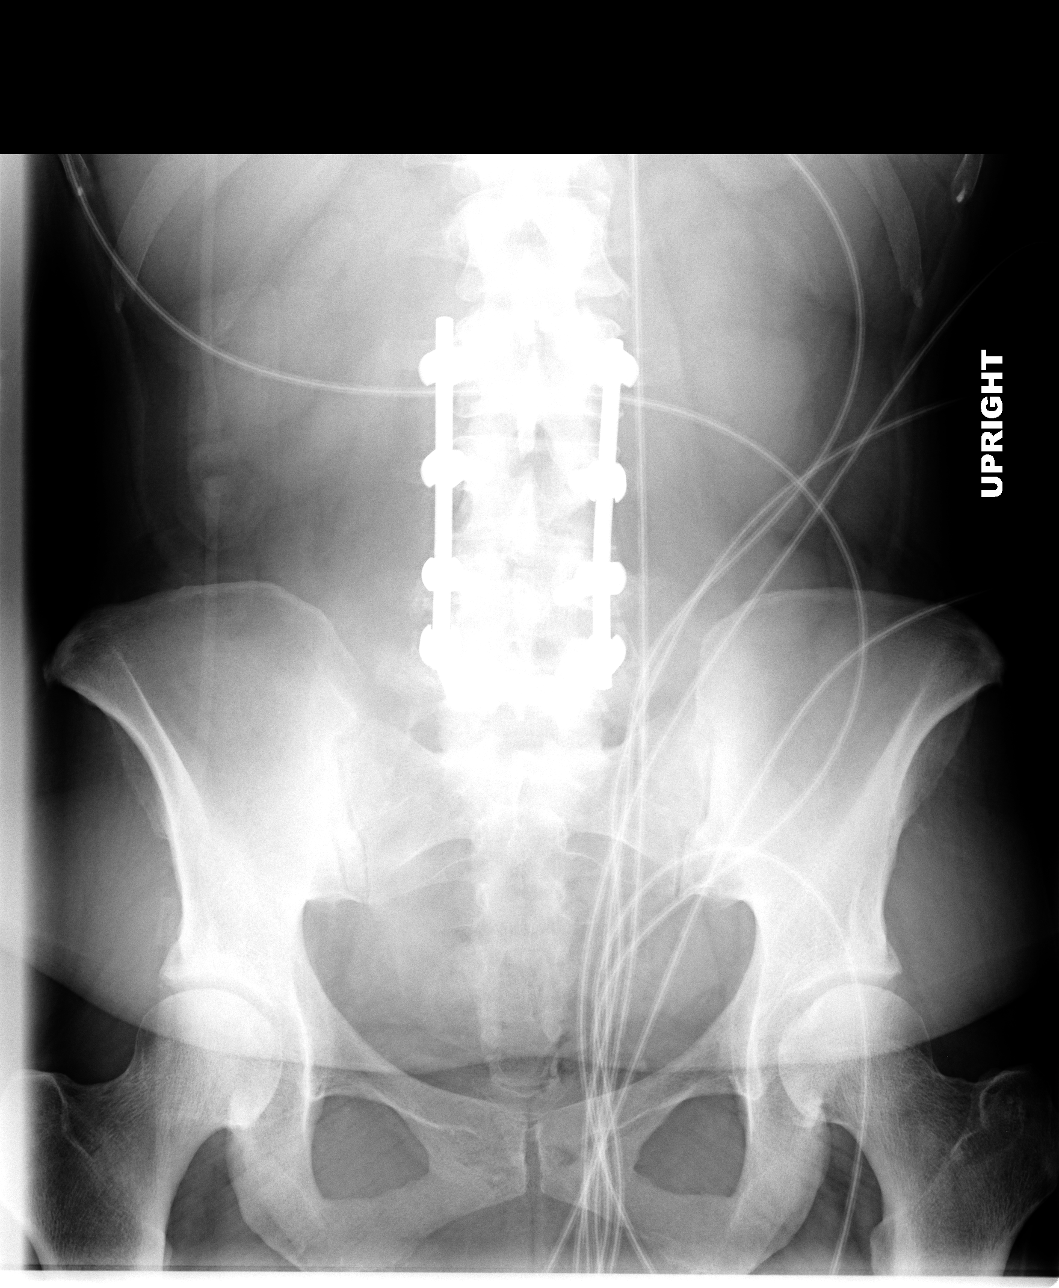

[view not recorded (3 of 3)]
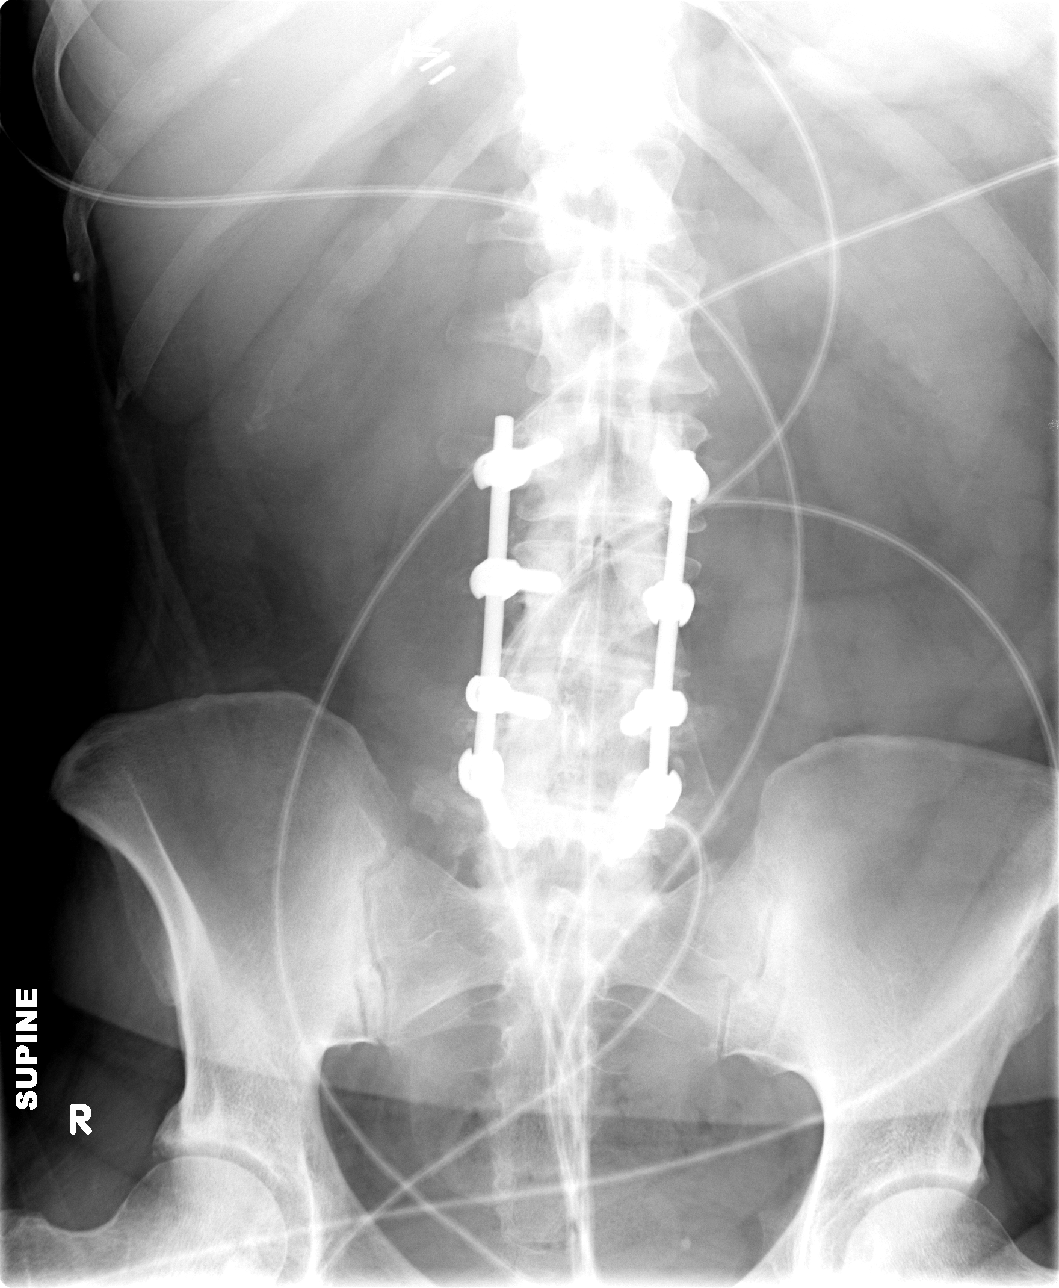

[3 of 3 positions shown; findings below may reference images not displayed]

ABDOMEN ACUTE WITH PA CHEST:

Comparison 07/10/2004

Normal heart size, mediastinal contours, and vascularity.
Right subclavian Port-A-Cath stable.
Chronic bronchitic changes without infiltrate or effusion.

Surgical clips right upper quadrant, question cholecystectomy.
Prior lumbar fusion.
Paucity of bowel gas without signs of bowel obstruction or wall thickening.
No free intraperitoneal air.
No acute bony abnormalities.
No urinary tract calcification.
IMPRESSION: No acute abnormalities.

## 2005-08-27 IMAGING — CT CT PELVIS W/O CM
1 series · 15 of 32 positions shown, 19 images · non-contrast
Comparison: none

HISTORY: Abdominal pain, hematuria

[Series [id number]: — · axial · 0.72mm/px · z∈[+1244,+1640]mm · 15 of 88 slices shown, 19 images]
[im 6/88  soft-tissue]
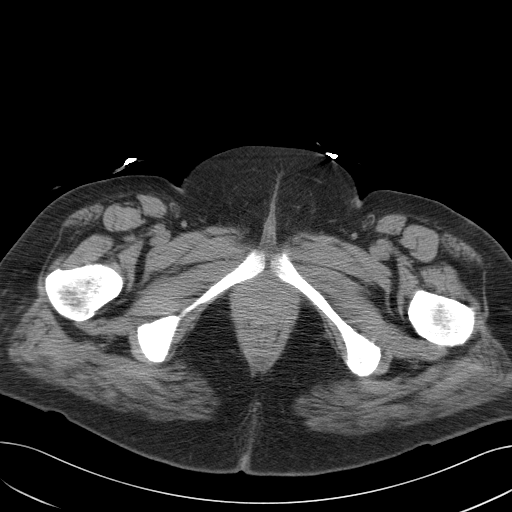
[im 6/88  bone]
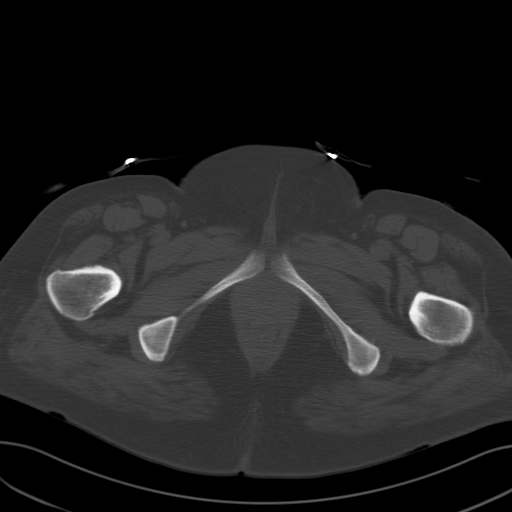
[im 12/88  soft-tissue]
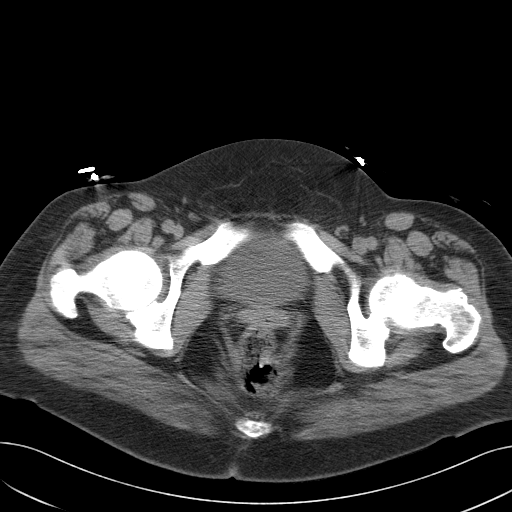
[im 17/88  soft-tissue]
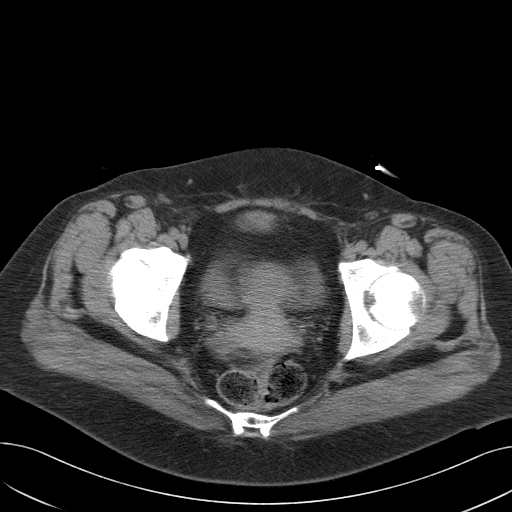
[im 26/88  soft-tissue]
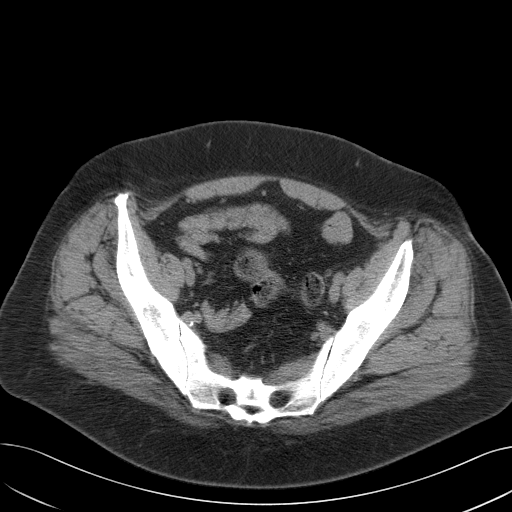
[im 31/88  soft-tissue]
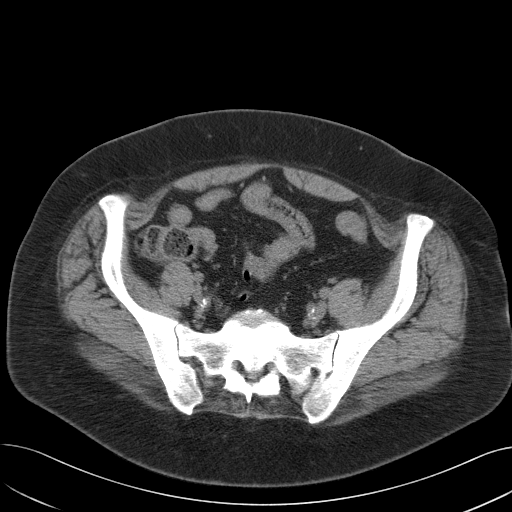
[im 37/88  soft-tissue]
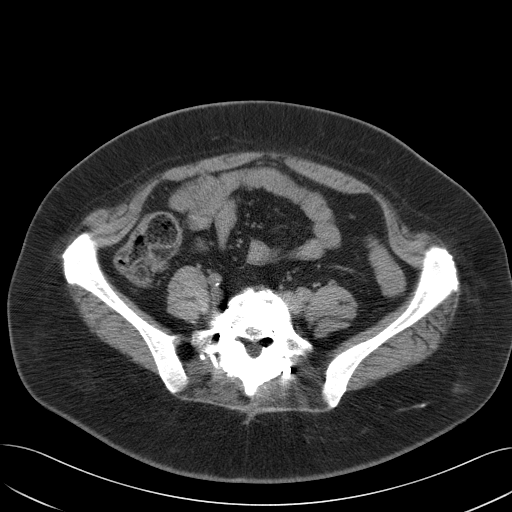
[im 45/88  soft-tissue]
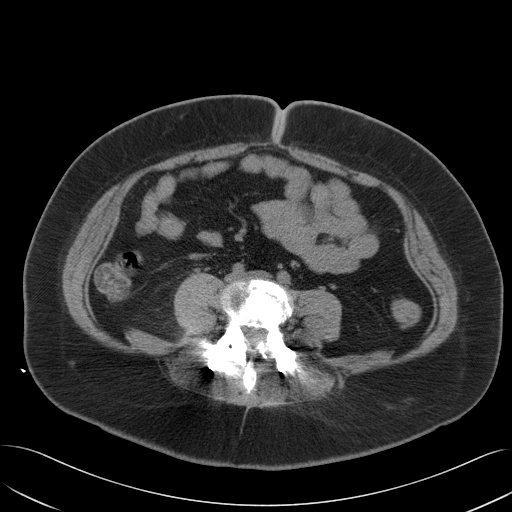
[im 51/88  soft-tissue]
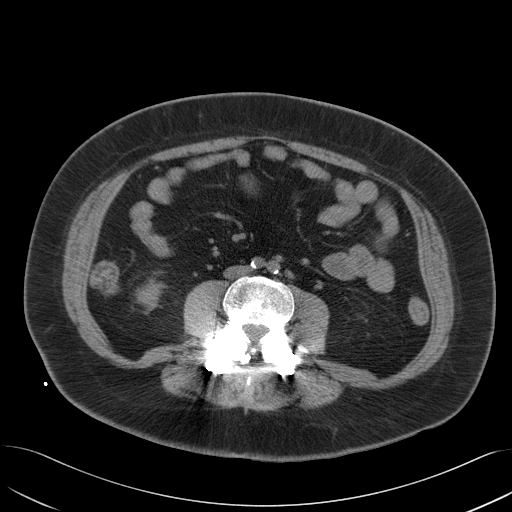
[im 57/88  soft-tissue]
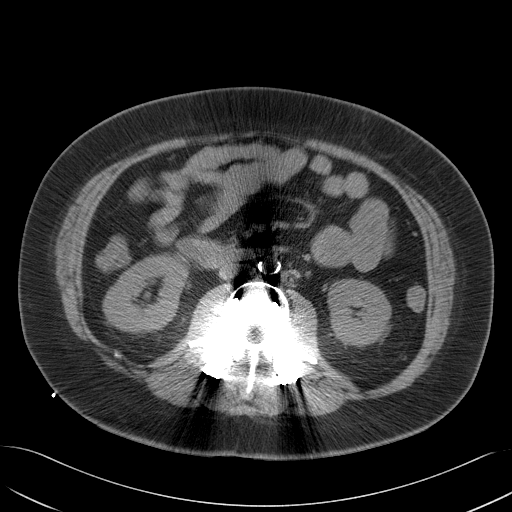
[im 57/88  bone]
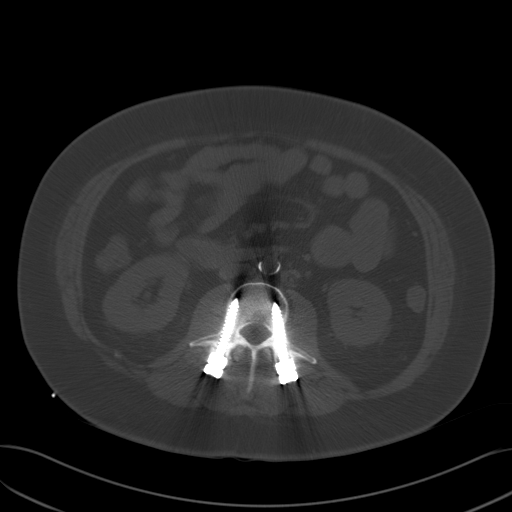
[im 62/88  soft-tissue]
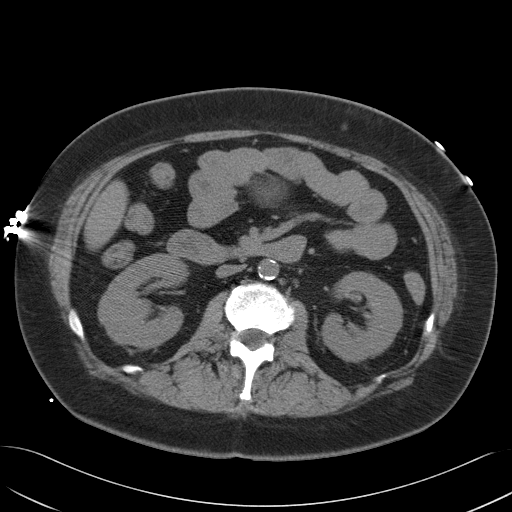
[im 71/88  soft-tissue]
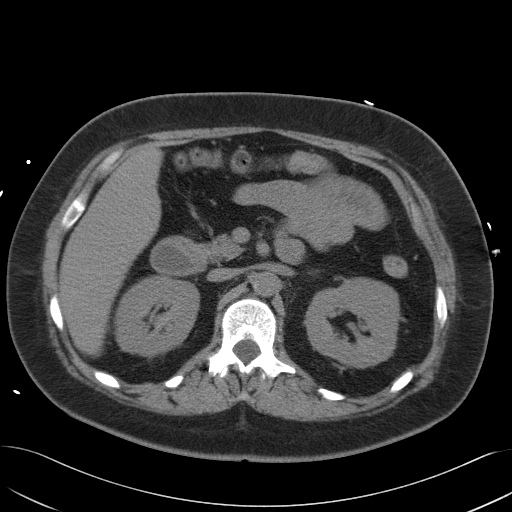
[im 76/88  soft-tissue]
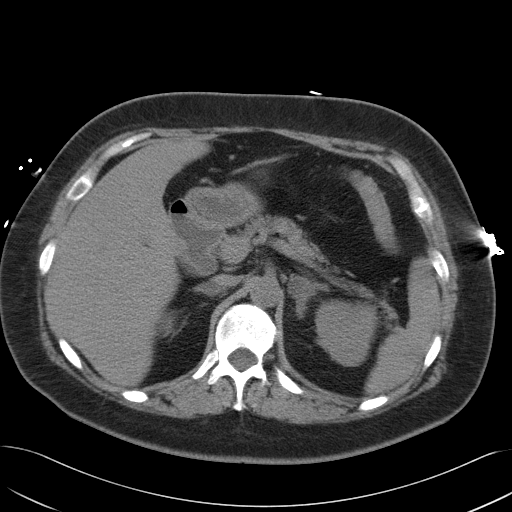
[im 76/88  lung]
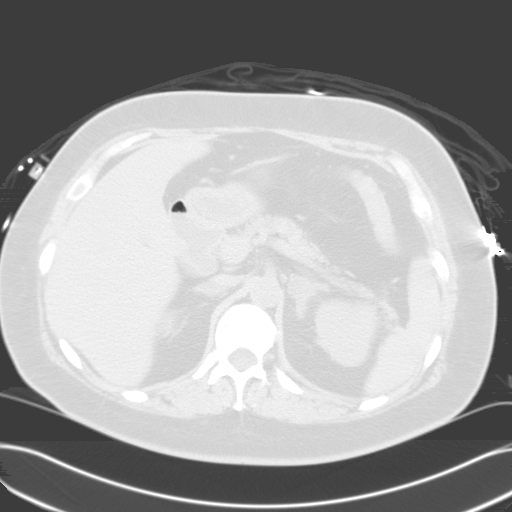
[im 79/88  lung]
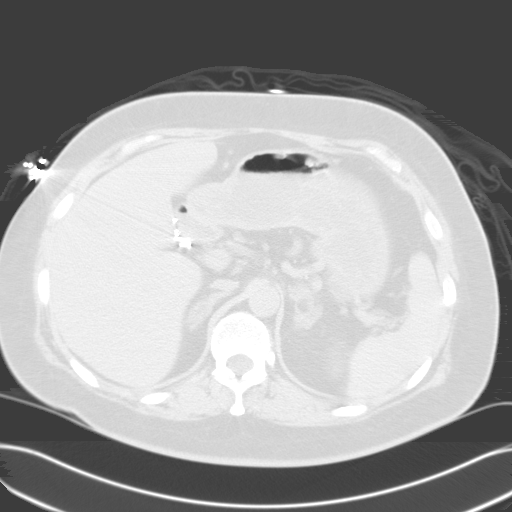
[im 82/88  soft-tissue]
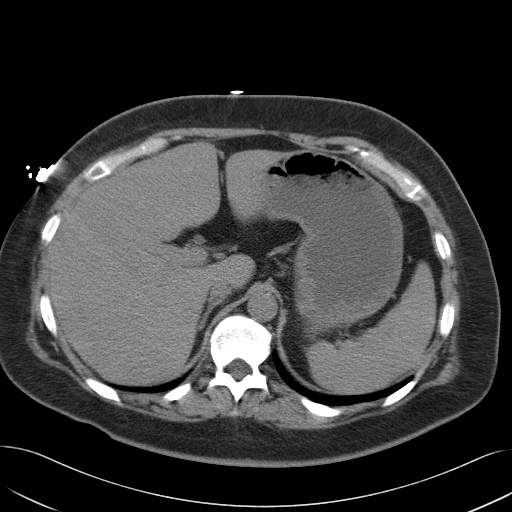
[im 82/88  lung]
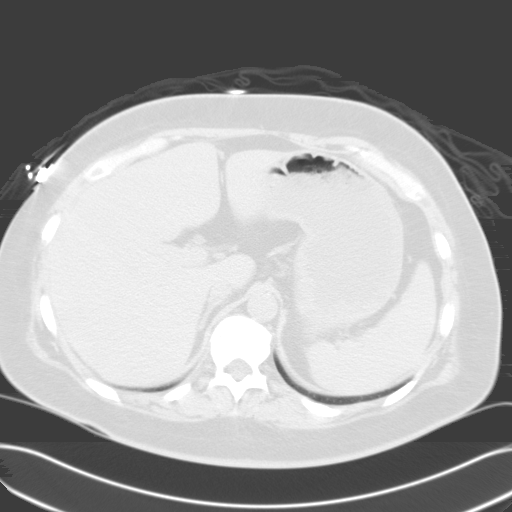
[im 85/88  lung]
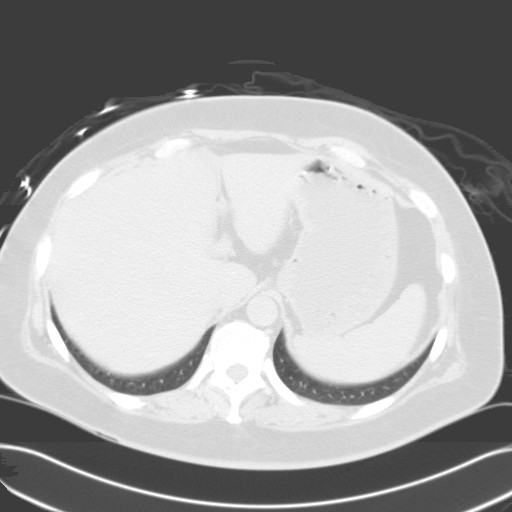

[15 of 32 positions shown; findings below may reference images not displayed]

CT ABDOMEN AND PELVIS WITHOUT CONTRAST:

Multidetector helical CT imaging of abdomen and pelvis performed using kidney
stone protocol. 
Neither oral nor intravenous contrast utilized for this indication.

Comparison 08/01/2004

CT ABDOMEN:

Lung bases clear.
No renal calculi, hydronephrosis, or ureteral dilatation.
Status post cholecystectomy and lumbar fusion.
Nodular low attenuation enlargement of adrenal glands, left larger than right,
stable since previous study, likely representing a combination of bilateral
adenomas as well as a small myelolipoma of left adrenal gland, 10 mm diameter,
unchanged.
Remaining solid organs and bowel loops and upper abdomen unremarkable for exam
locking IV and oral contrast.
IMPRESSION: Probable bilateral adrenal adenomas and small myelolipoma left adrenal gland.
No acute upper abdominal abnormalities.

CT PELVIS:

Normal appendix.
Mild scattered atherosclerotic calcifications.
No distal ureteral calcification or dilatation.
Small bilateral pelvic phleboliths unchanged.
Uterus and ovaries unremarkable.
Bowel loops normal.
IMPRESSION: Premature atherosclerotic calcification for age.
No acute pelvic abnormalities.

## 2005-09-23 IMAGING — CR DG ABDOMEN ACUTE W/ 1V CHEST
3 series · 3 of 3 positions shown · non-contrast
Comparison: none

HISTORY: Gastroparesis, nausea, vomiting

[view not recorded (1 of 3)]
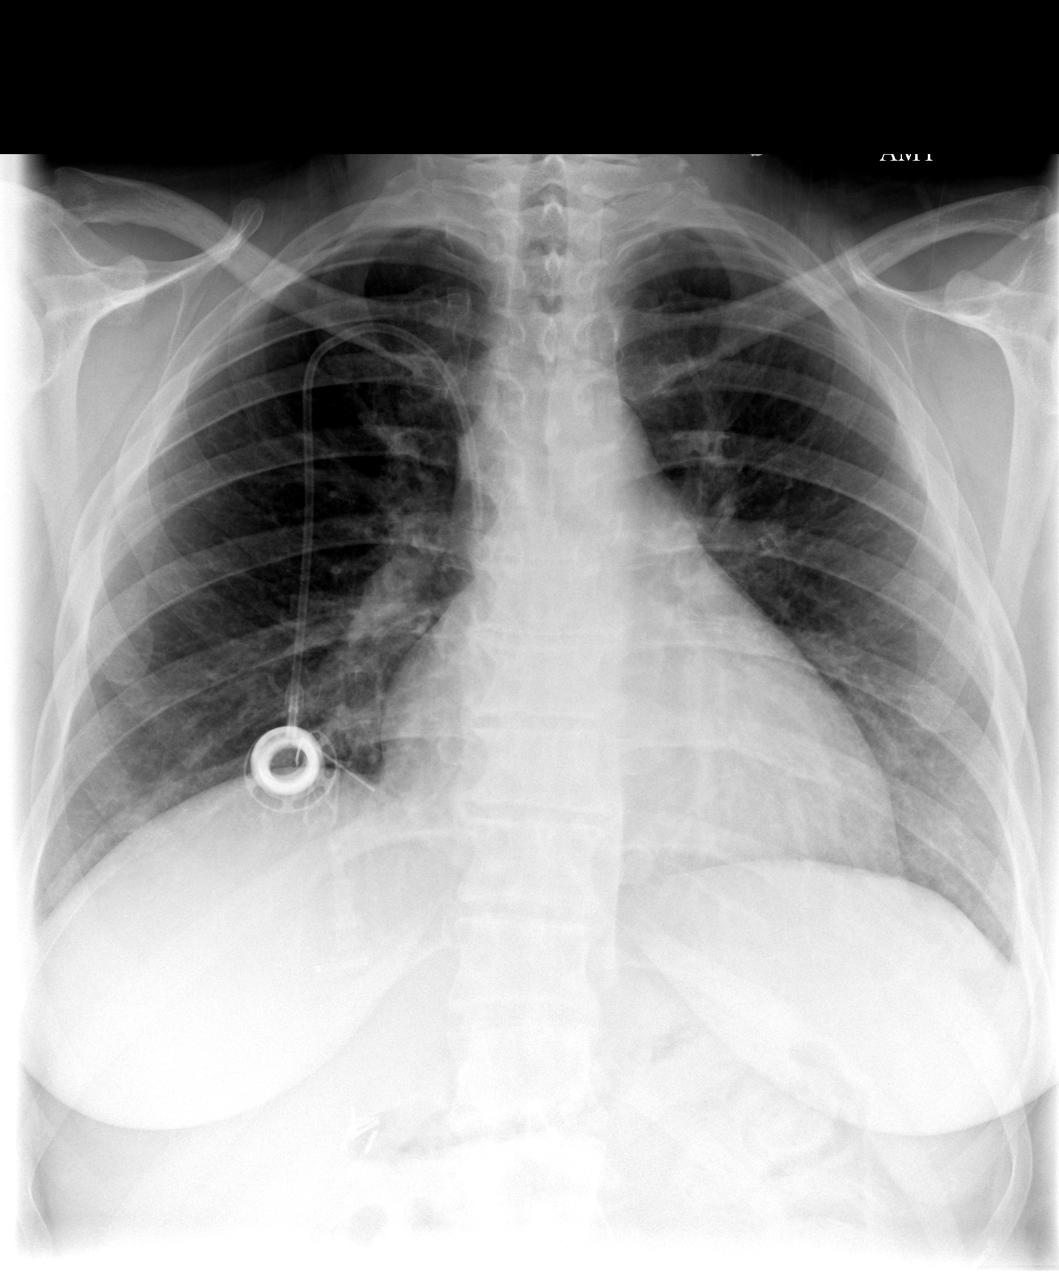

[view not recorded (2 of 3)]
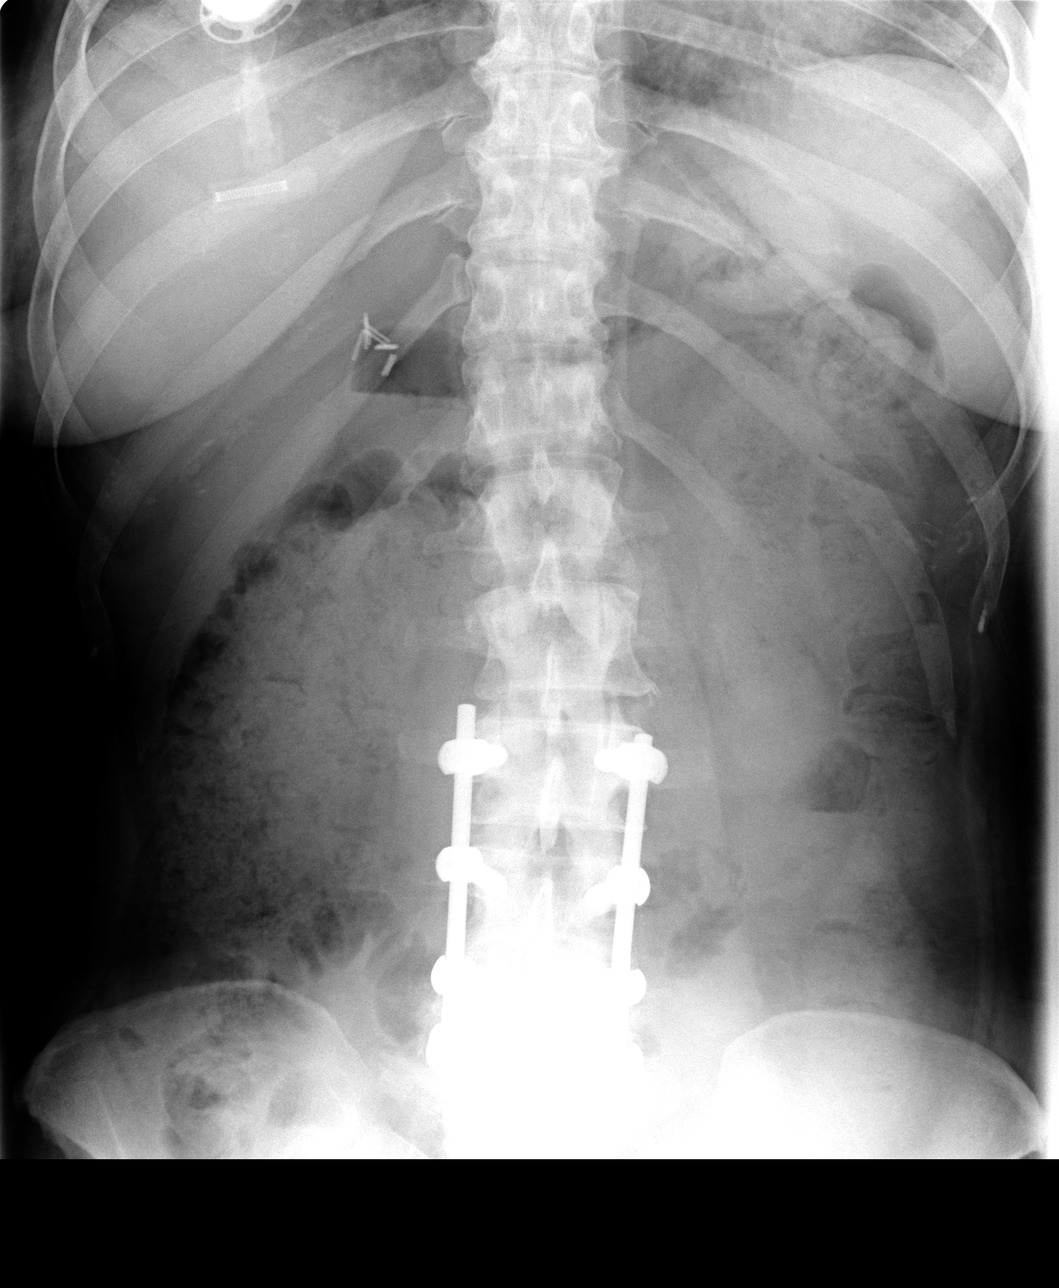

[view not recorded (3 of 3)]
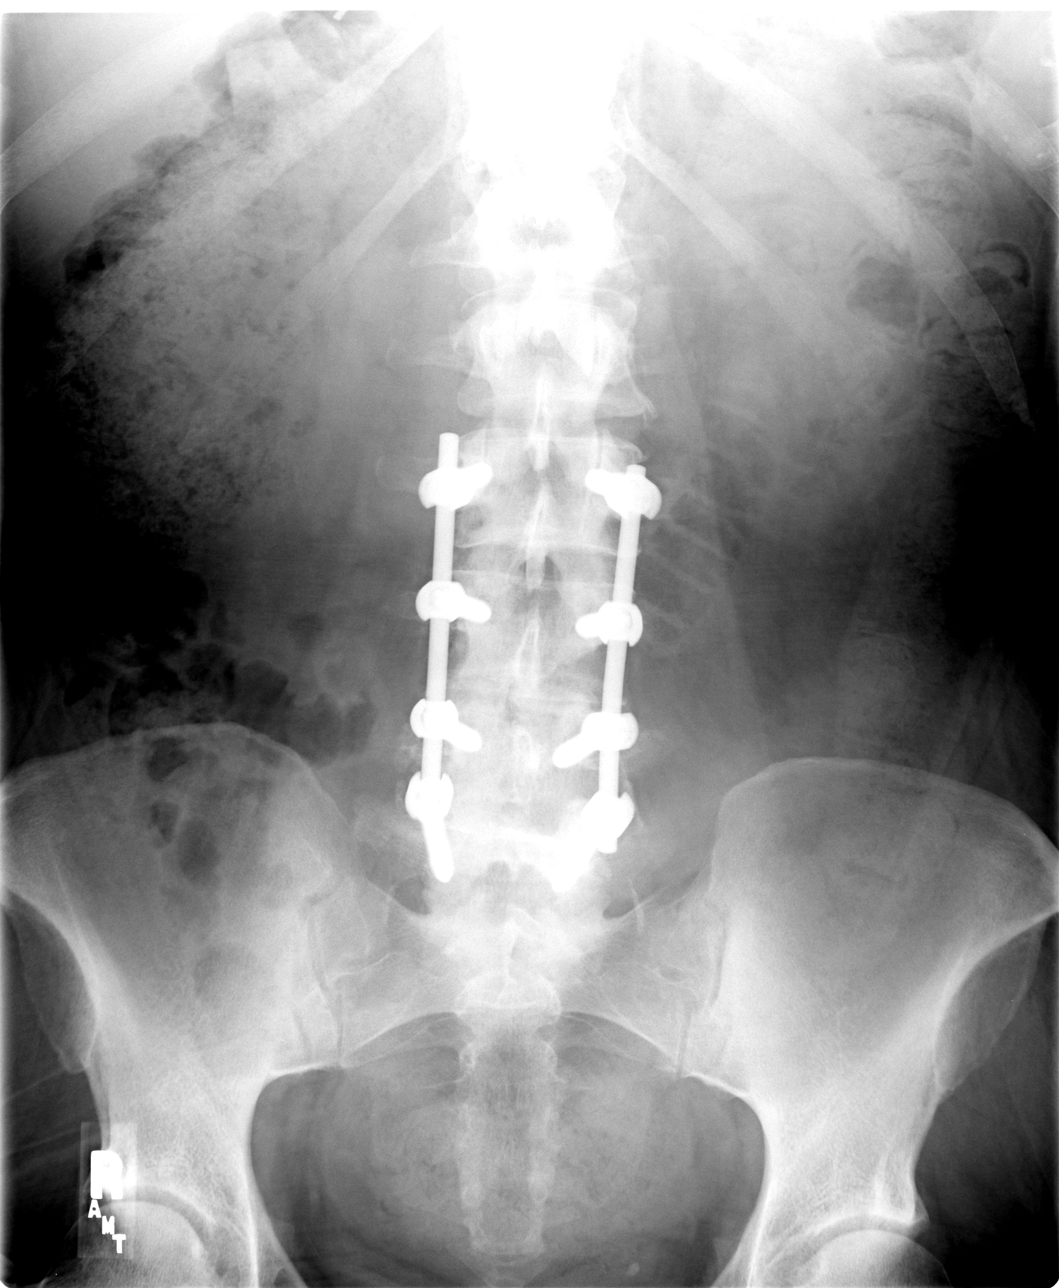

[3 of 3 positions shown; findings below may reference images not displayed]

ABDOMEN ACUTE WITH PA CHEST:

Comparison 09/24/2004

Cardiomegaly.
Right subclavian Port-A-Cath stable.
Mediastinal contours and vascularity stable.
No infiltrate or effusion.

Prior spinal fixation at 4 contiguous levels lumbar spine.
Surgical clips right upper quadrant, question cholecystectomy.
Increased stool in proximal half of colon, question constipation.
No definite bowel obstruction and wall thickening, or perforation.
Single upper normal size small bowel loop left midabdomen.
IMPRESSION: Constipation. No acute cardiopulmonary abnormalities. Cardiomegaly.

## 2005-10-10 ENCOUNTER — Emergency Department (HOSPITAL_COMMUNITY): Admission: EM | Admit: 2005-10-10 | Discharge: 2005-10-10 | Payer: Self-pay | Admitting: Emergency Medicine

## 2005-10-15 IMAGING — CR DG ABDOMEN 2V
3 series · 3 of 3 positions shown · non-contrast
Comparison: 09/25/2004 and portable upright chest radiograph obtained earlier
today.

CLINICAL DATA: Chest pain, nausea and vomiting.

ABDOMEN - 2 VIEW

[view not recorded (1 of 3)]
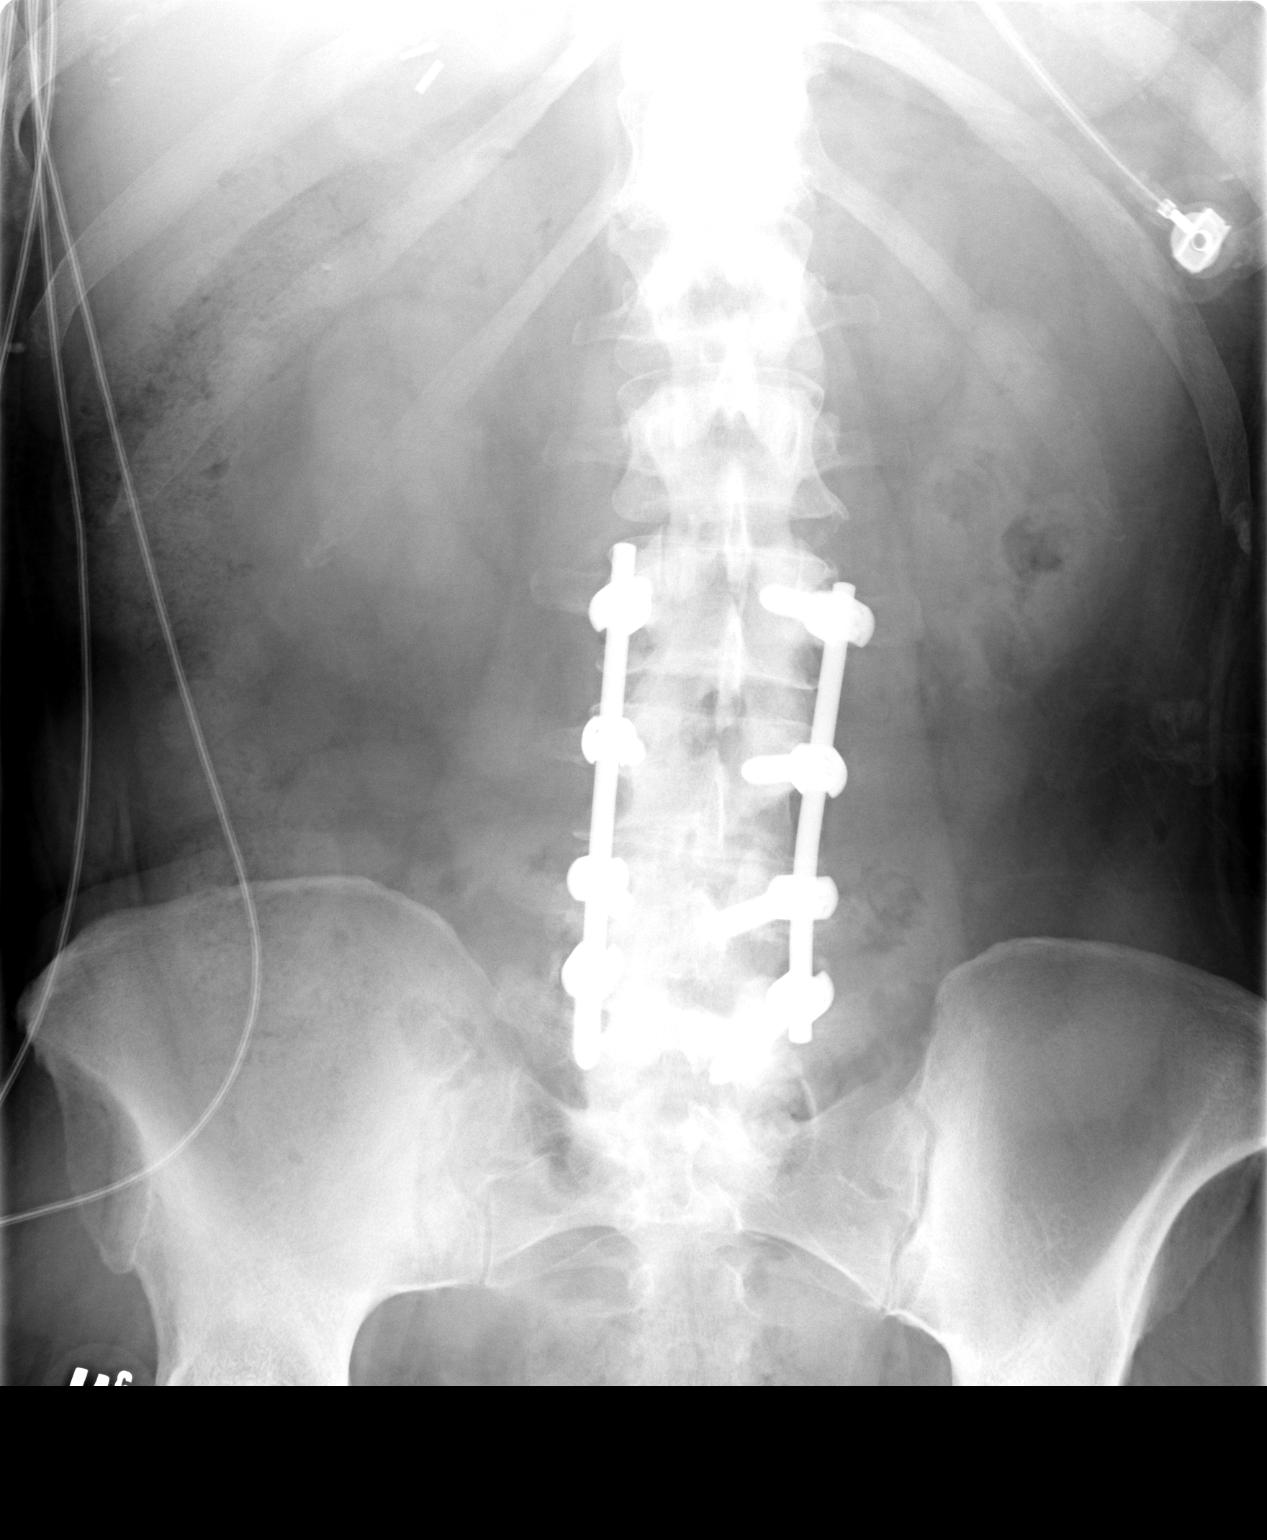

[view not recorded (2 of 3)]
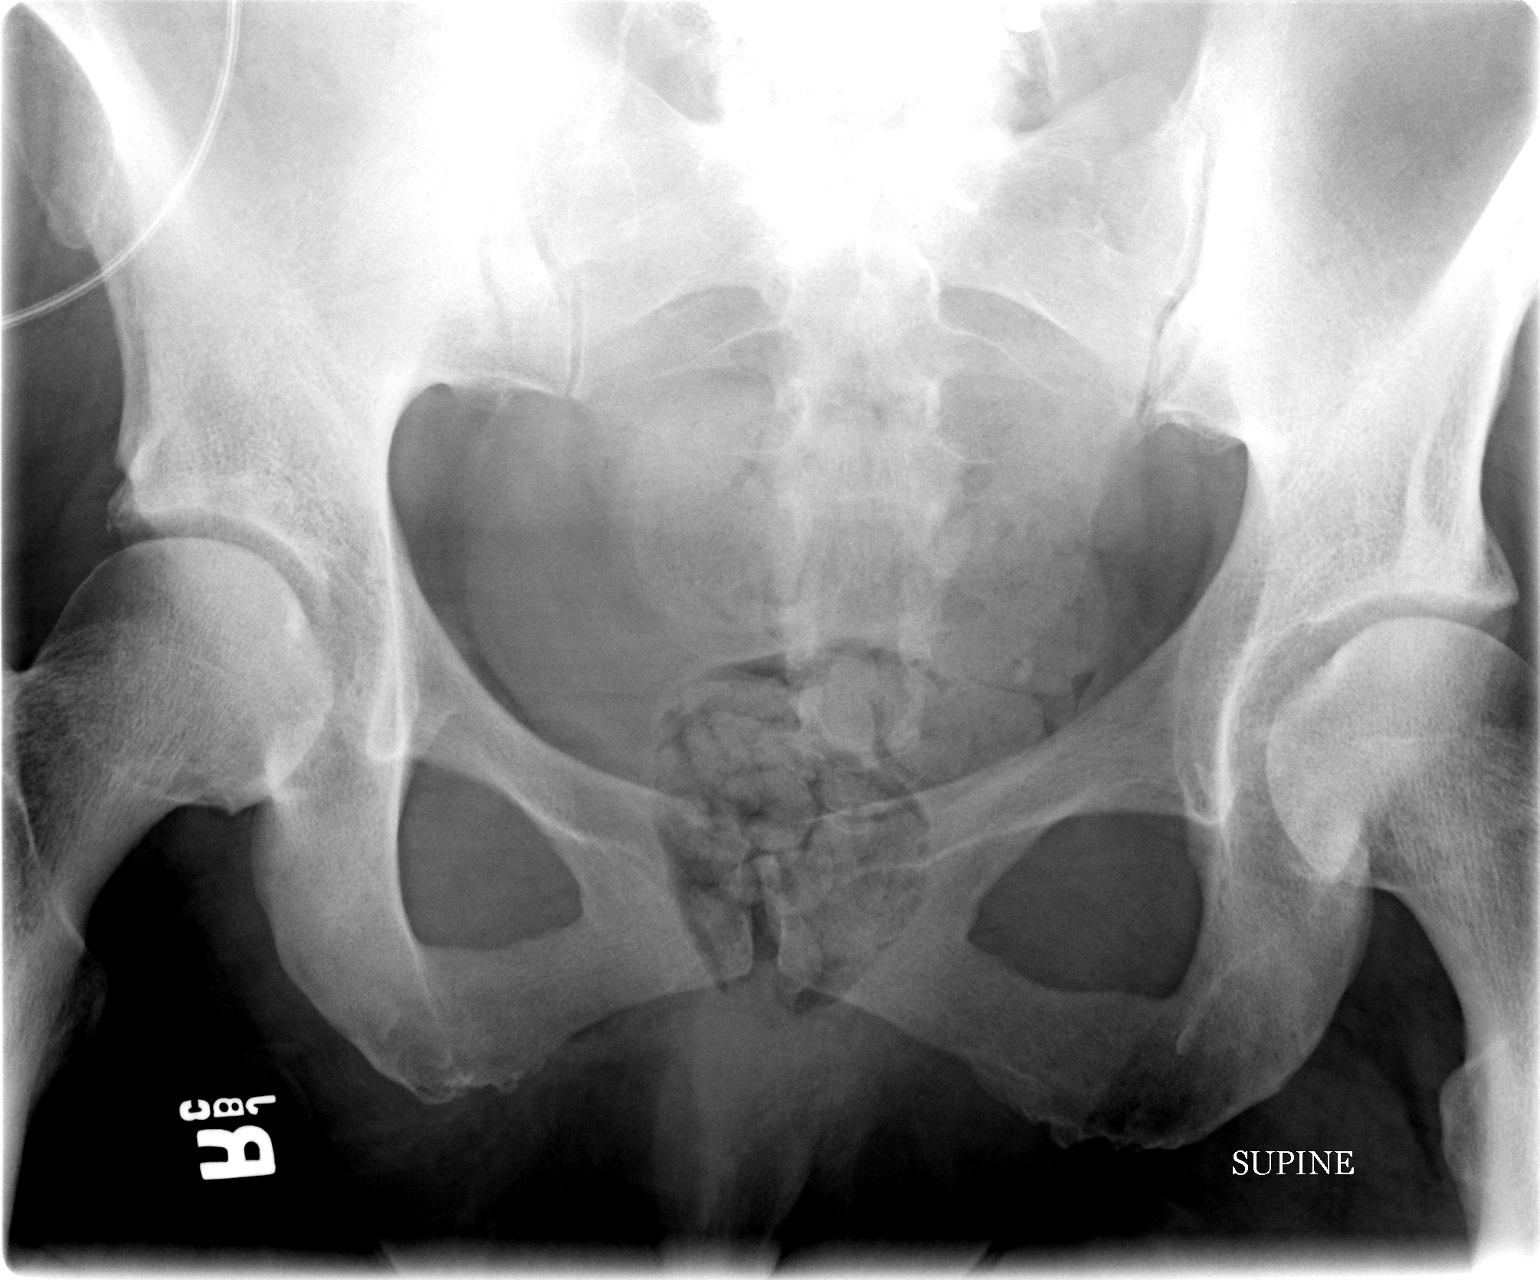

[view not recorded (3 of 3)]
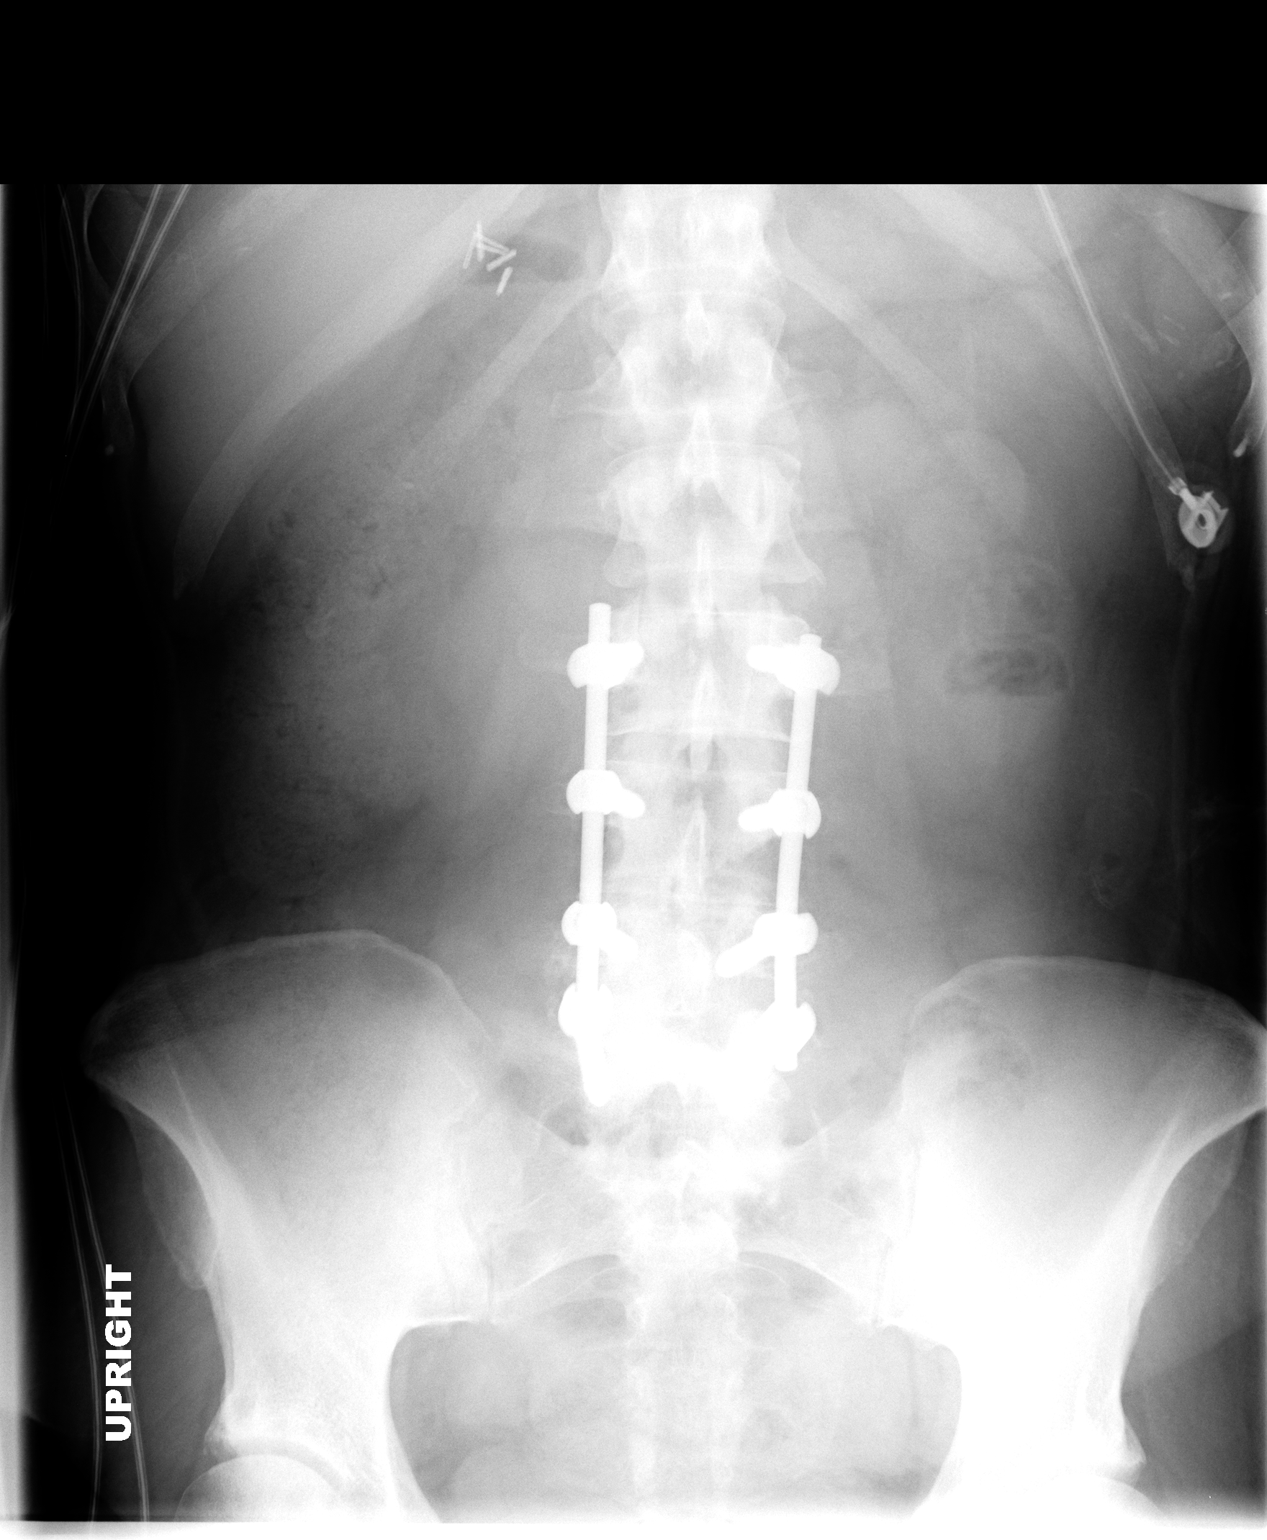

[3 of 3 positions shown; findings below may reference images not displayed]

FINDINGS: Normal bowel gas pattern without free peritoneal air. Cholecystectomy
clips. Screw and rod fixation of the mid and lower lumbar spine.

IMPRESSION

No acute abnormality.

## 2005-10-28 ENCOUNTER — Inpatient Hospital Stay (HOSPITAL_COMMUNITY): Admission: EM | Admit: 2005-10-28 | Discharge: 2005-10-30 | Payer: Self-pay | Admitting: Emergency Medicine

## 2005-11-08 ENCOUNTER — Emergency Department (HOSPITAL_COMMUNITY): Admission: EM | Admit: 2005-11-08 | Discharge: 2005-11-08 | Payer: Self-pay | Admitting: Emergency Medicine

## 2005-11-10 ENCOUNTER — Inpatient Hospital Stay (HOSPITAL_COMMUNITY): Admission: EM | Admit: 2005-11-10 | Discharge: 2005-11-14 | Payer: Self-pay | Admitting: Emergency Medicine

## 2005-11-25 ENCOUNTER — Emergency Department (HOSPITAL_COMMUNITY): Admission: EM | Admit: 2005-11-25 | Discharge: 2005-11-26 | Payer: Self-pay | Admitting: Emergency Medicine

## 2005-11-28 IMAGING — CR DG ABDOMEN ACUTE W/ 1V CHEST
4 series · 4 of 4 positions shown · non-contrast
Comparison: none

CLINICAL DATA: 34 year-old chest and abdominal pain.
 ACUTE ABDOMINAL SERIES:
 A single upright view of the chest is compared with previous study from 10/17/04. Port-a-catheter is stable.  There is borderline cardiac enlargement.  No acute pulmonary findings.  
 Two views of the abdomen demonstrate a moderate amount of stool throughout the colon suggesting constipation.  There are a few air-filled loops of small bowel with no dilatation suggesting small bowel obstruction.  The soft tissue shadows of the abdomen are maintained. No worrisome calcifications are seen. Fusion hardware is noted in the lumbar spine.

[view not recorded (1 of 4)]
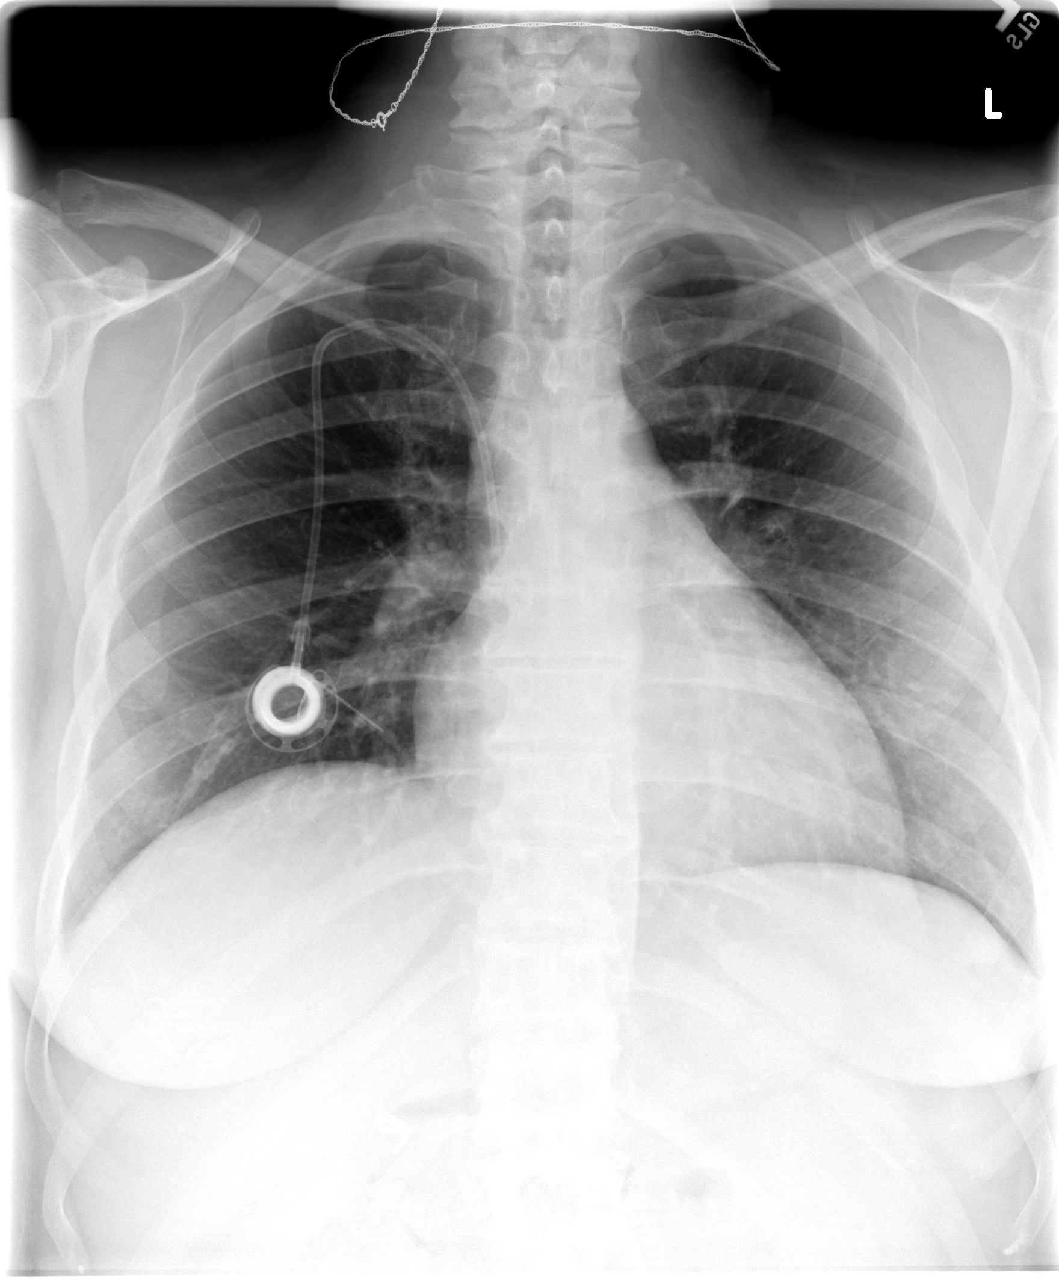

[view not recorded (2 of 4)]
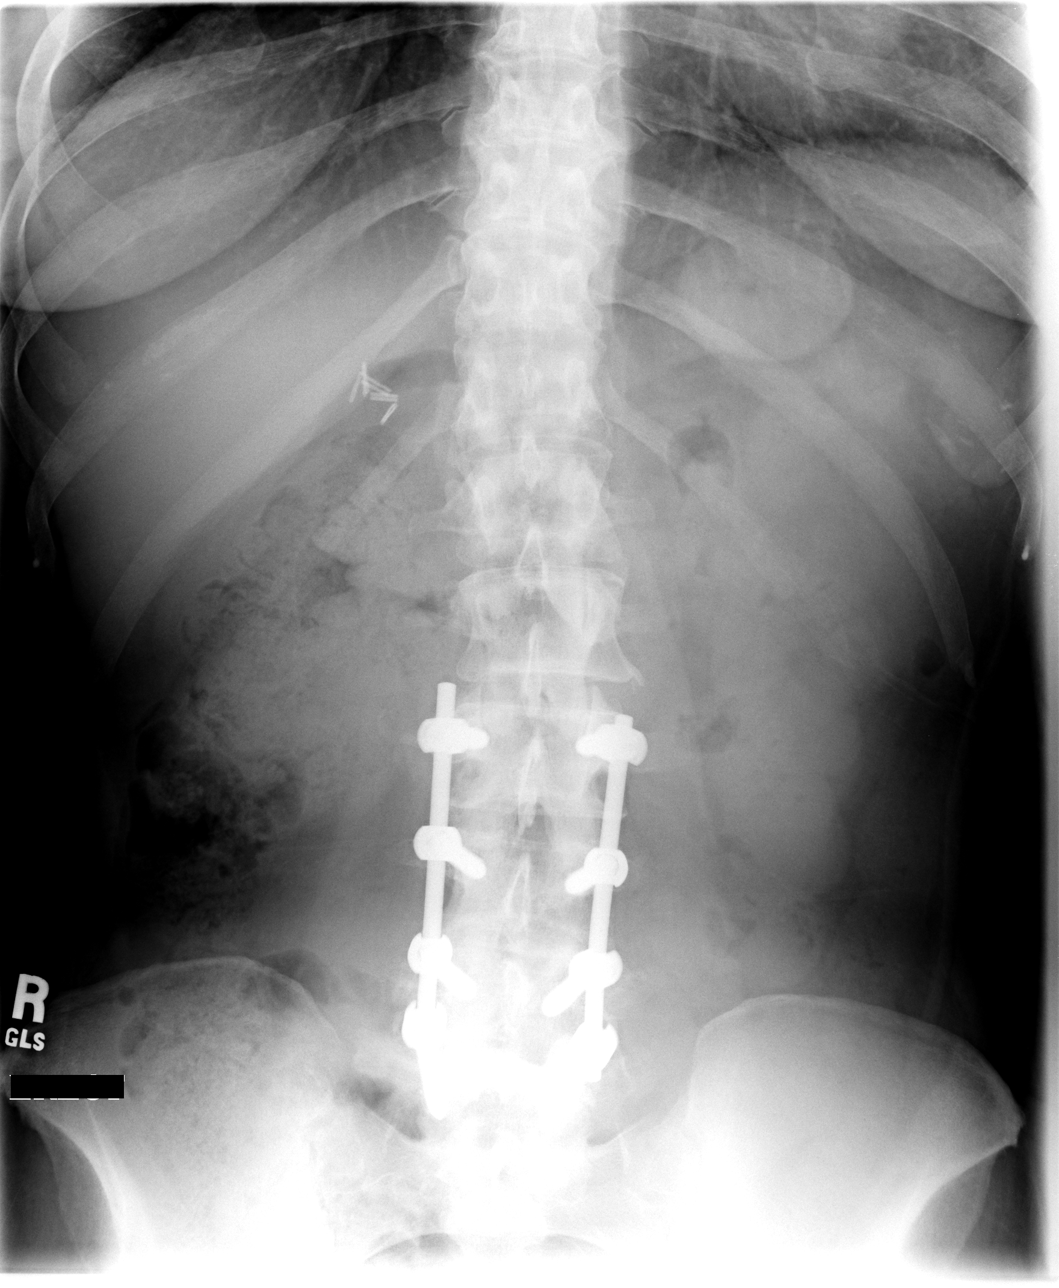

[view not recorded (3 of 4)]
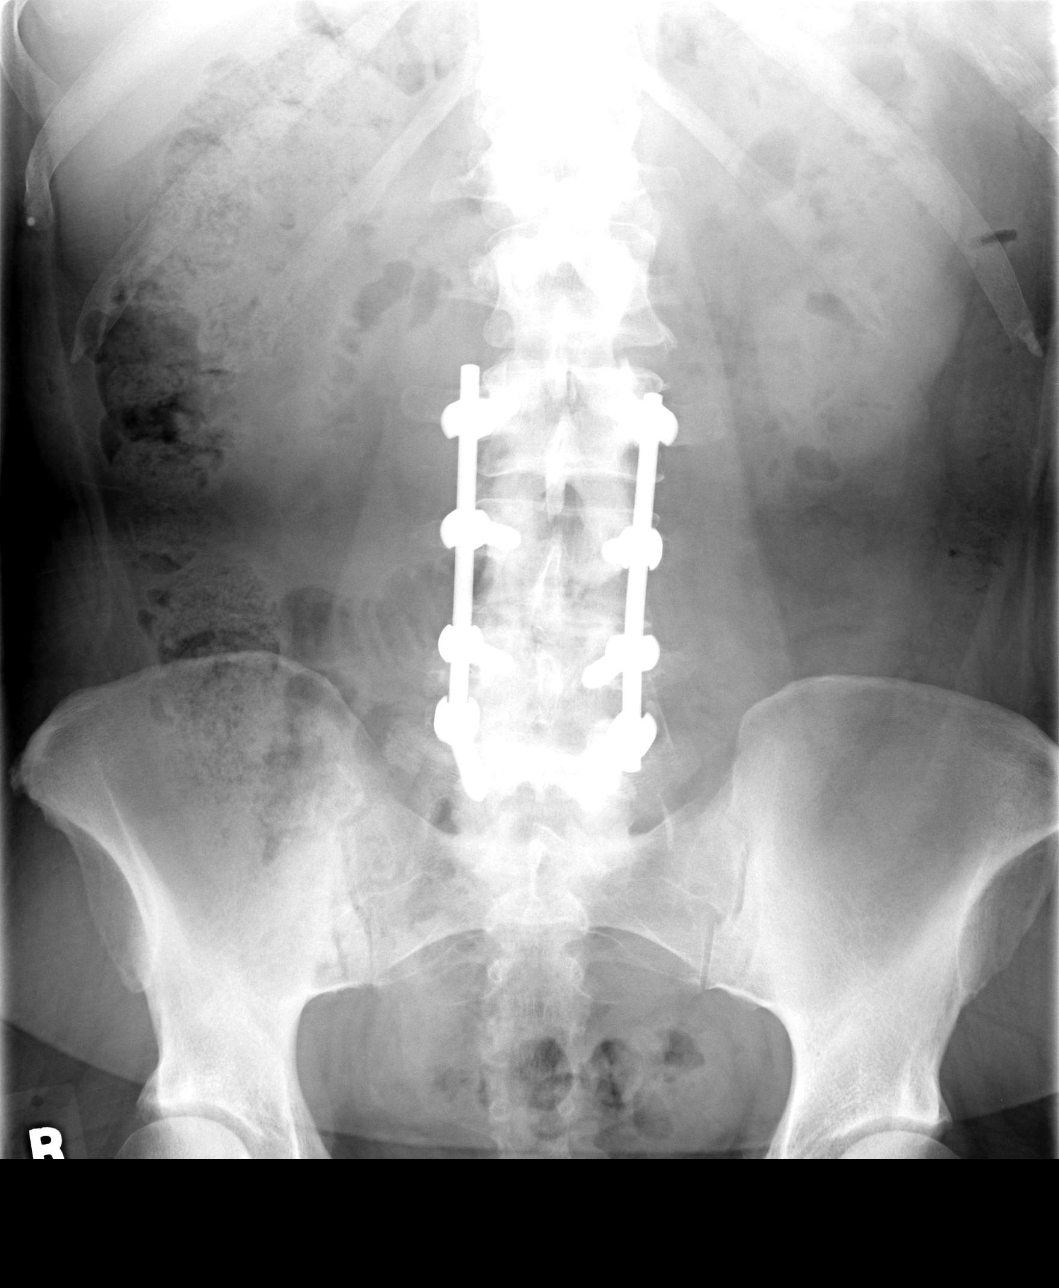

[view not recorded (4 of 4)]
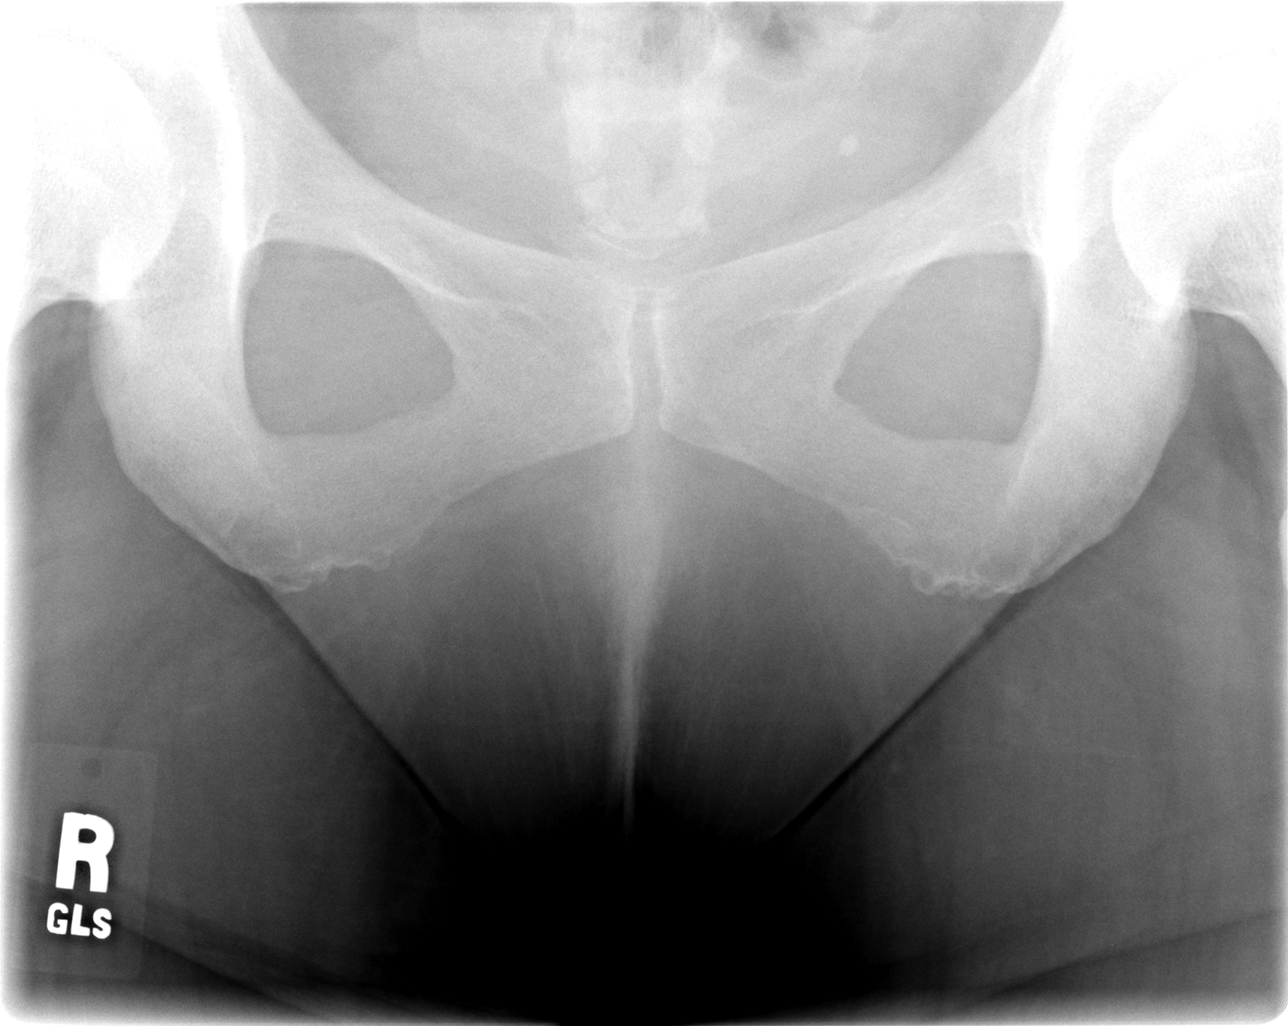

[4 of 4 positions shown; findings below may reference images not displayed]

IMPRESSION: 1.  No acute cardiopulmonary findings.
 2.  No plain film evidence of acute abdominal process.  
 3.  Moderate constipation.

## 2005-12-07 IMAGING — CR DG ABDOMEN ACUTE W/ 1V CHEST
3 series · 3 of 3 positions shown · non-contrast
Comparison: none

CLINICAL DATA: Abdominal pain.  Nausea and vomiting.  
 ACUTE ABDOMEN SERIES WITH CHEST:
 Chest:  A single view of the chest is compared to a chest x-ray of 11/30/04.  Prominent interstitial markings are noted.  This could represent interstitial edema or viral pneumonia and clinical correlation is recommended.  There is mild cardiomegaly present.  A port-a-cath is unchanged in position.  
 Abdomen:  Supine and erect views of the abdomen are compared to films of 11/30/04 as well.  No free intraperitoneal air is seen.  There is feces throughout the colon.  Posterior lumbar fusion is noted.

[view not recorded (1 of 3)]
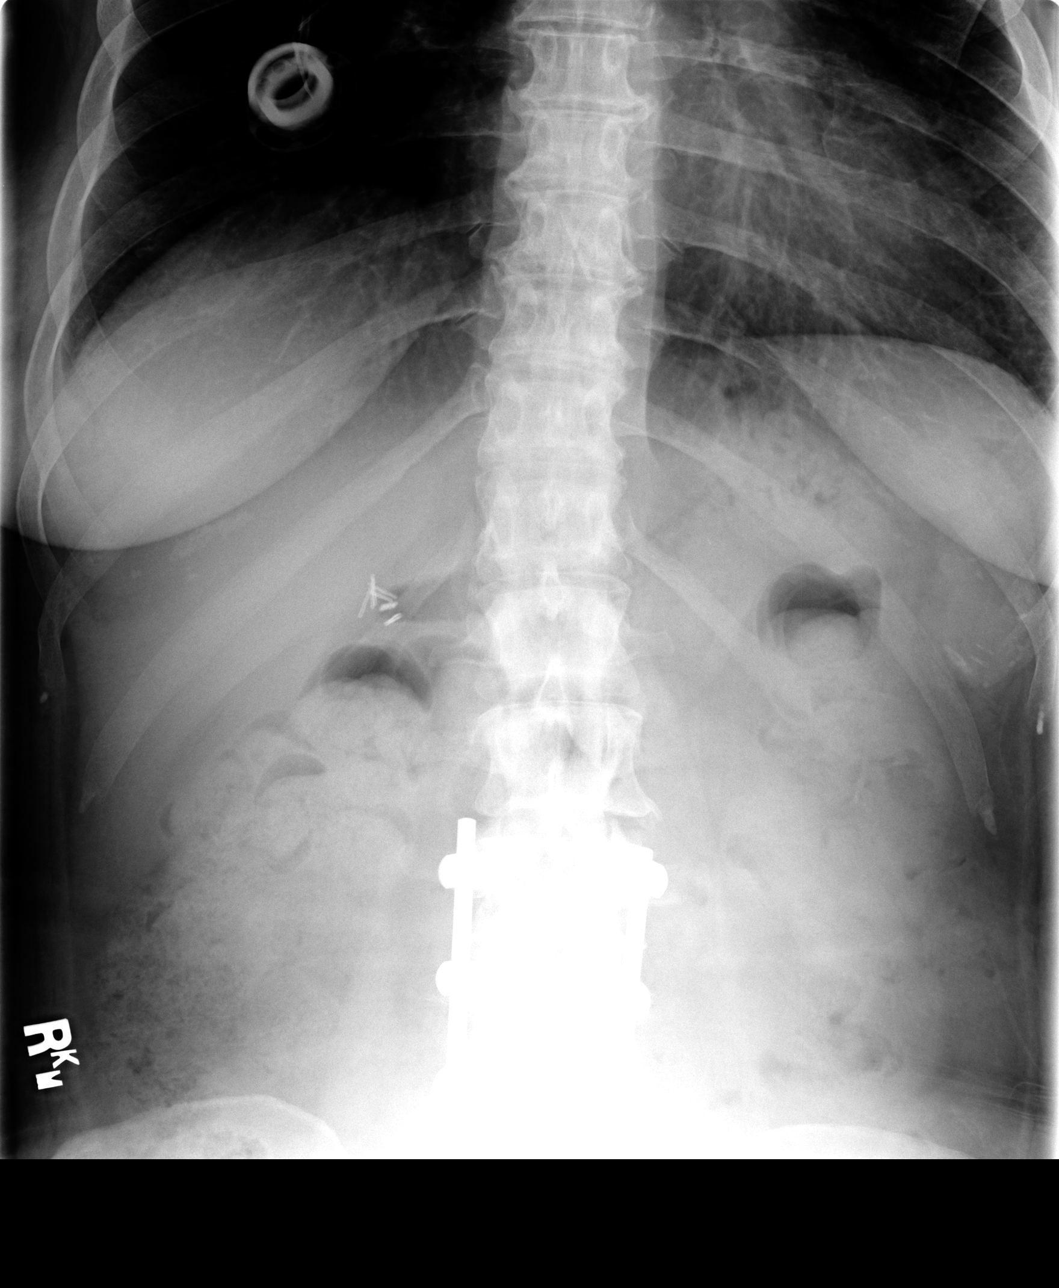

[view not recorded (2 of 3)]
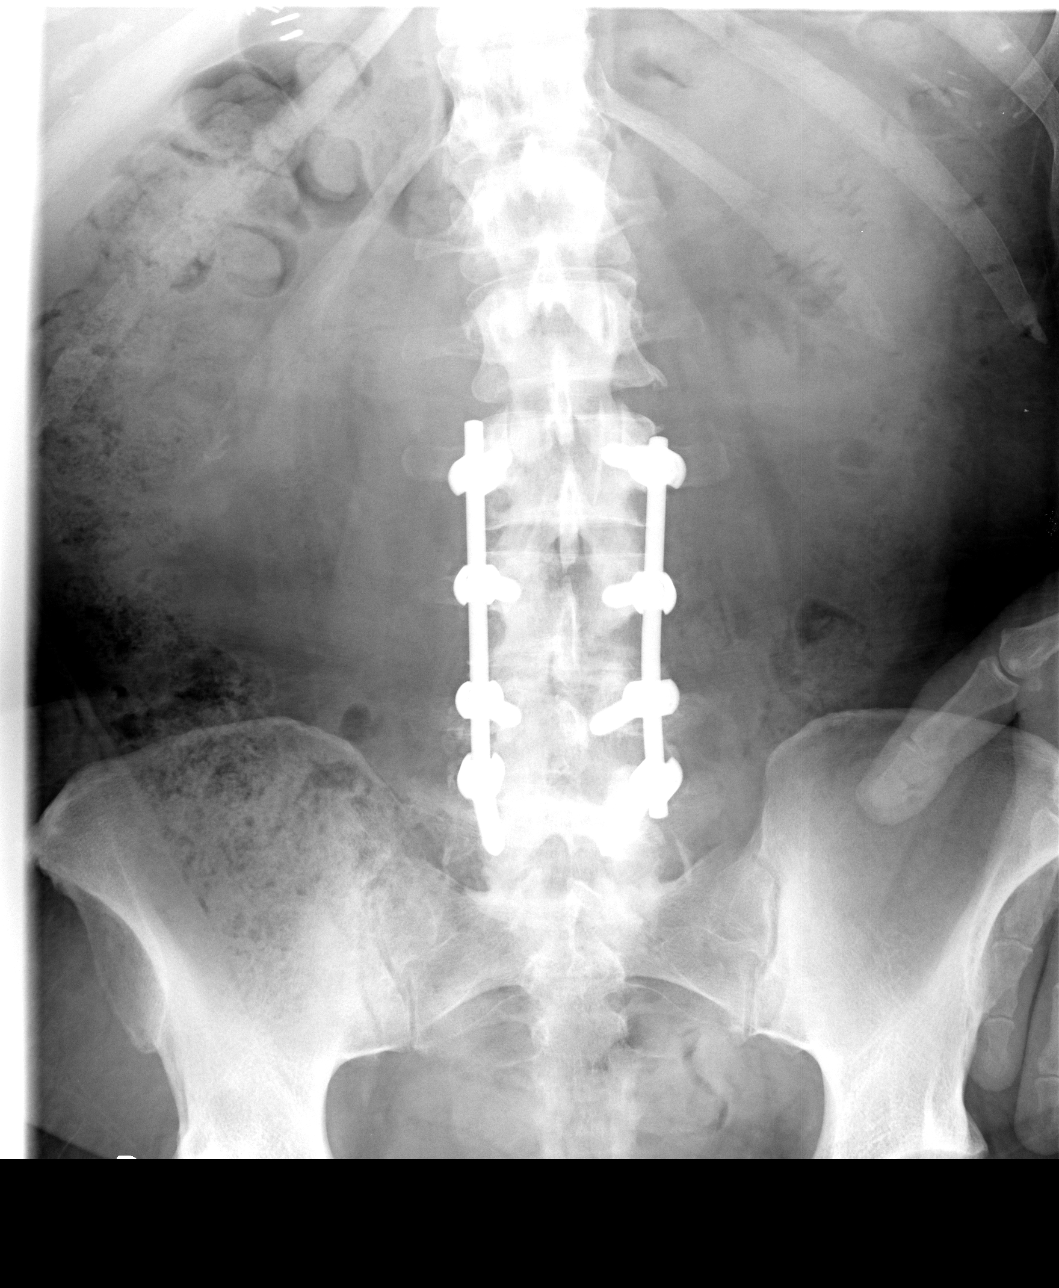

[view not recorded (3 of 3)]
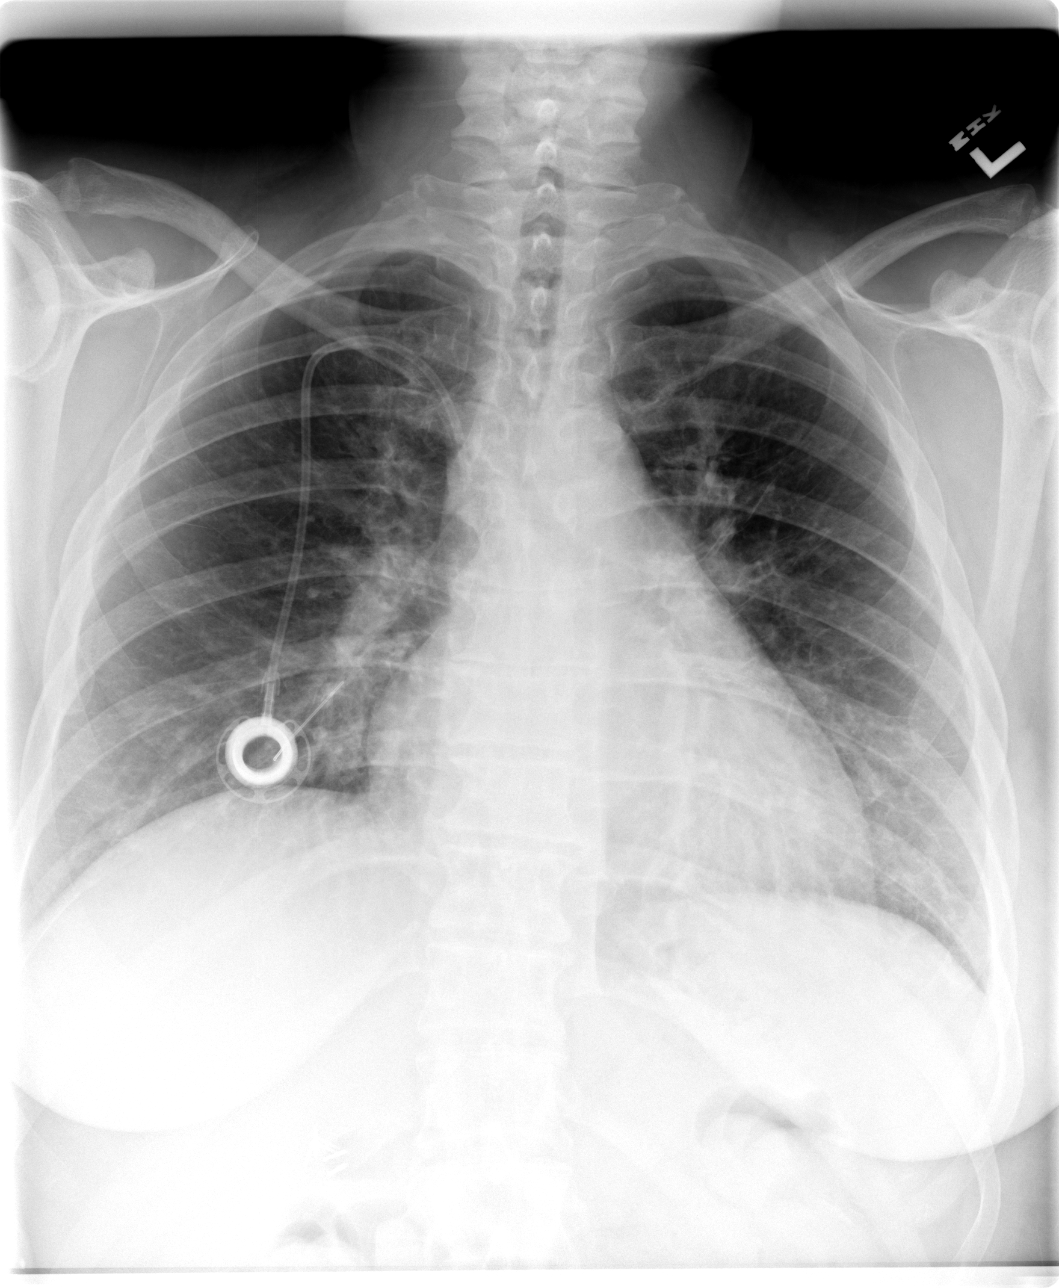

[3 of 3 positions shown; findings below may reference images not displayed]

IMPRESSION: 1.  Prominent interstitial markings.  Question interstitial edema versus viral pneumonia.  
 2.  No obstruction or free air.  Moderate amount of feces throughout the colon.

## 2005-12-10 IMAGING — US US EXTREM LOW VENOUS*L*
1 series · 14 of 23 positions shown · non-contrast
Comparison: none

CLINICAL DATA: Left leg pain. 

Left  lower extremity venous Doppler ultrasound:
The lower extremity deep venous system demonstrates normal compressibility,
phasicity, and augmentation.  No evidence of DVT.

[Series 1: unknown · 14 of 23 slices shown]
[im 1/23]
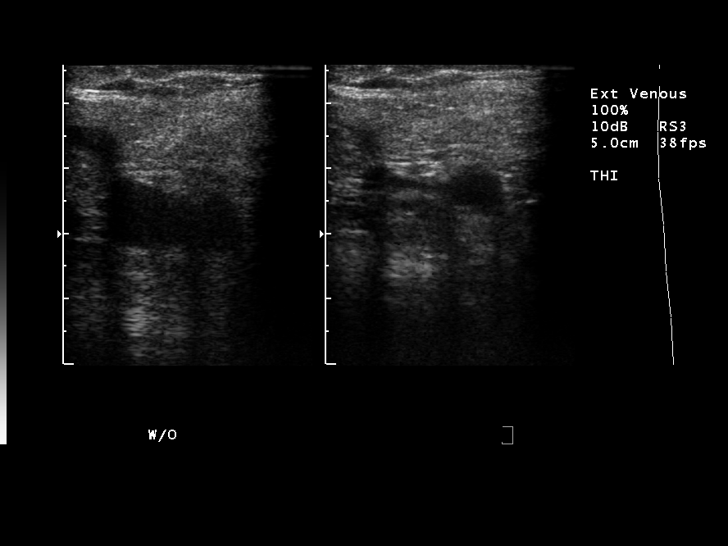
[im 3/23]
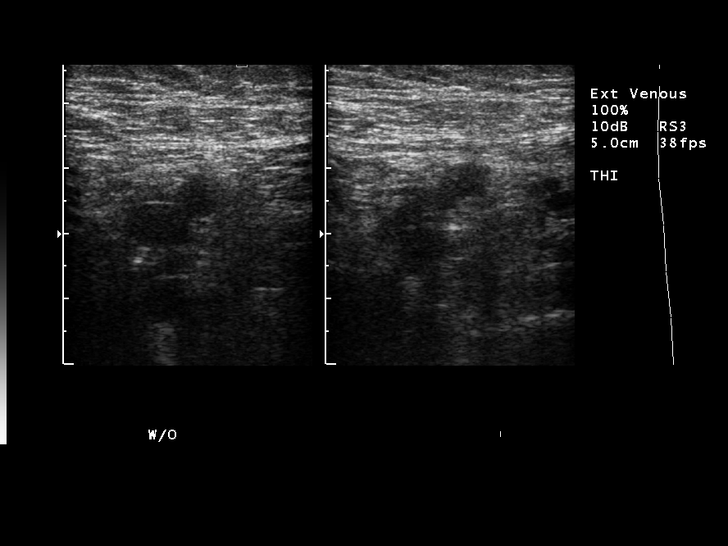
[im 5/23]
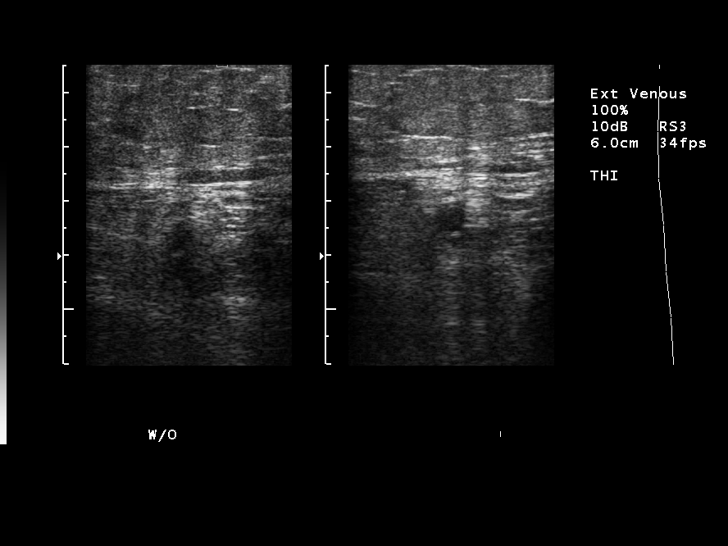
[im 6/23]
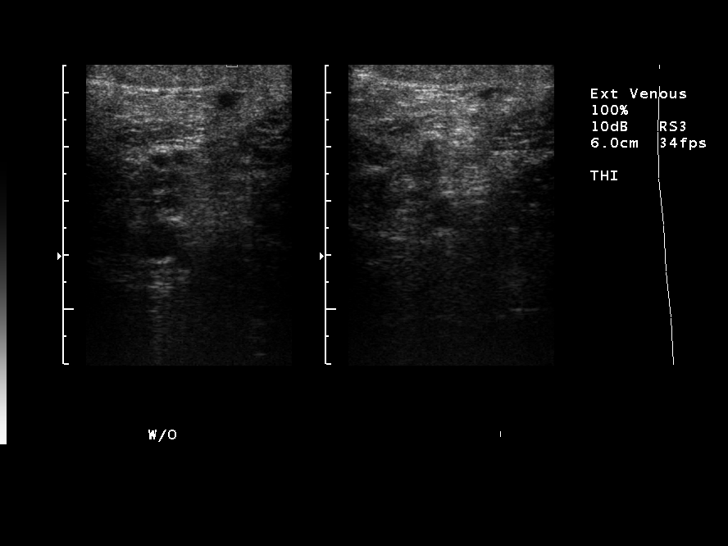
[im 8/23]
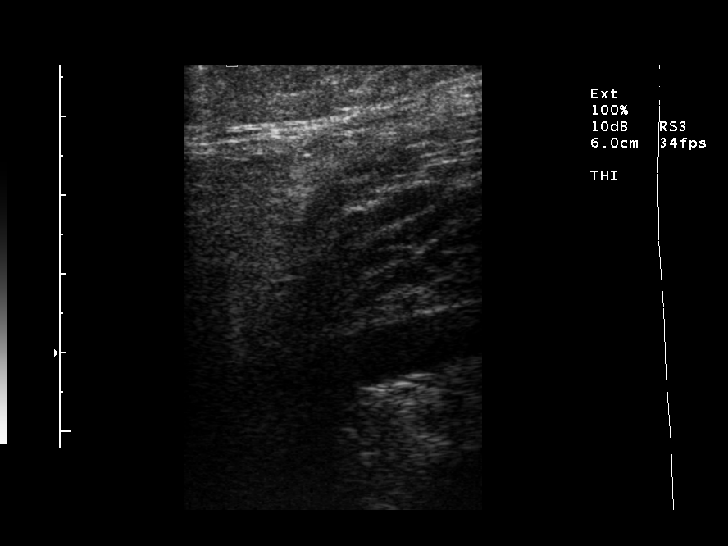
[im 10/23]
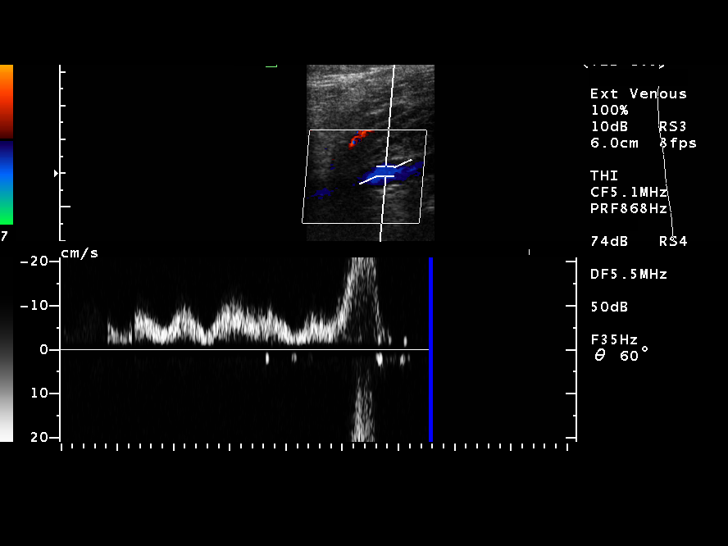
[im 11/23]
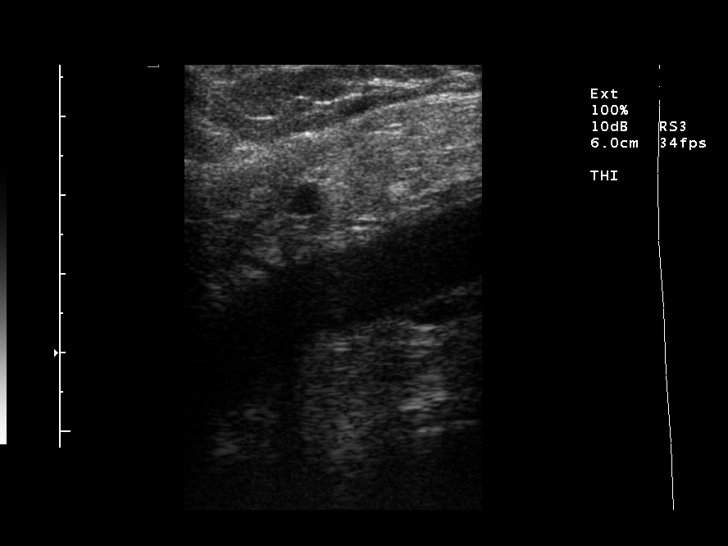
[im 13/23]
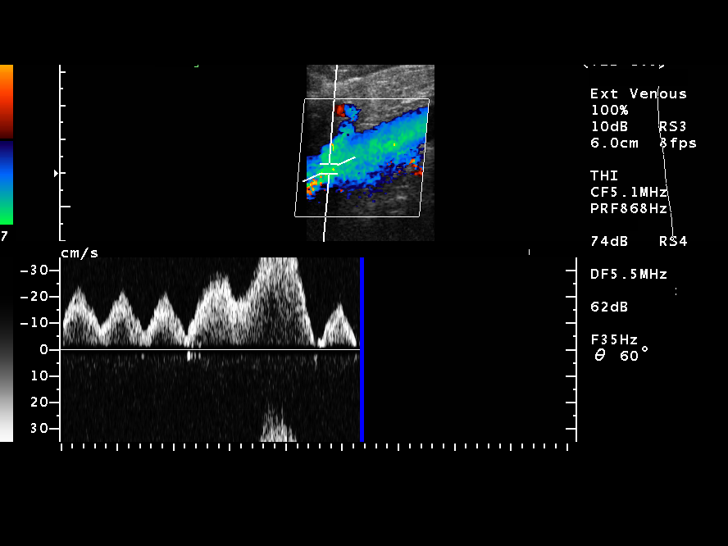
[im 14/23]
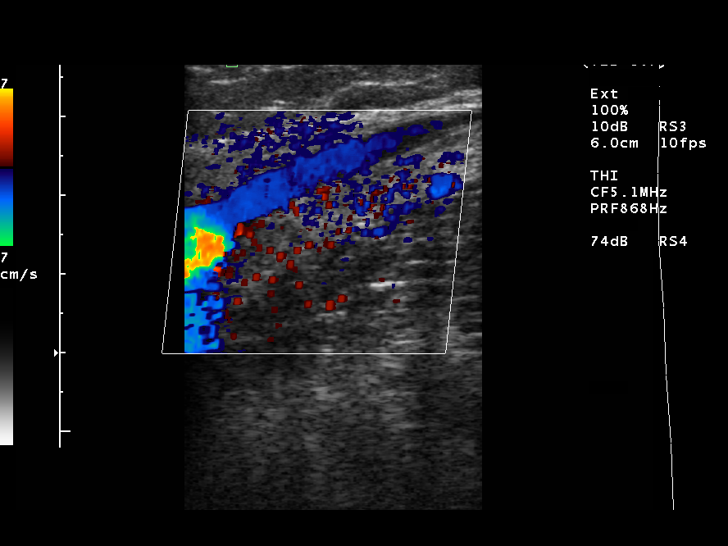
[im 16/23]
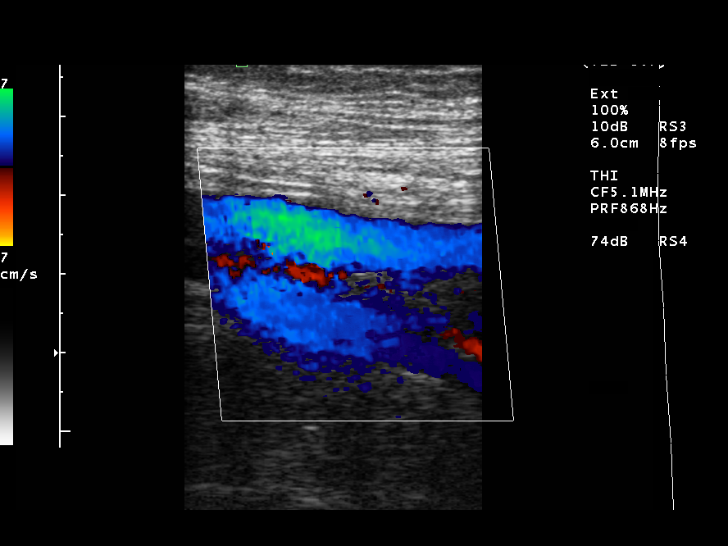
[im 18/23]
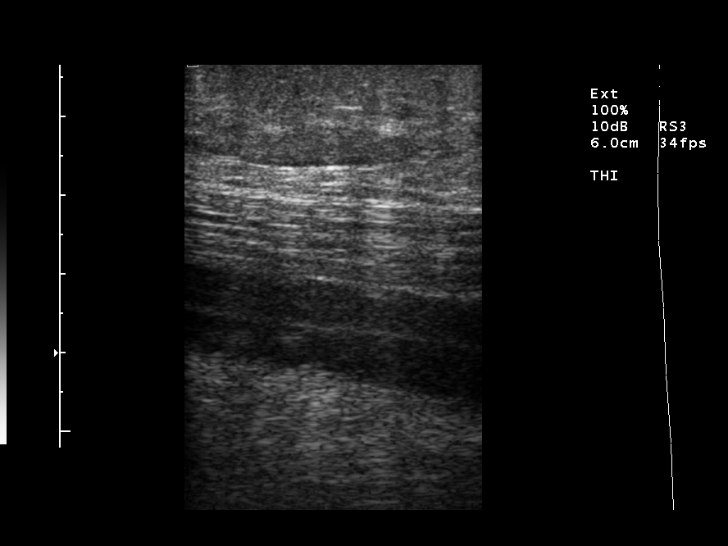
[im 19/23]
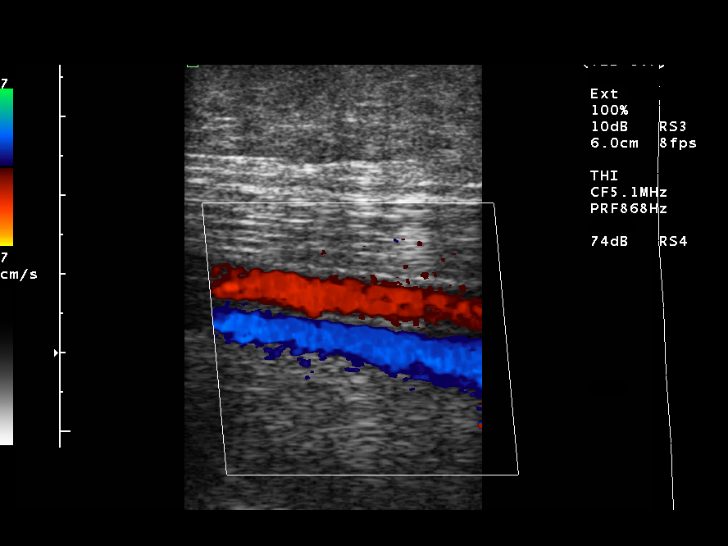
[im 21/23]
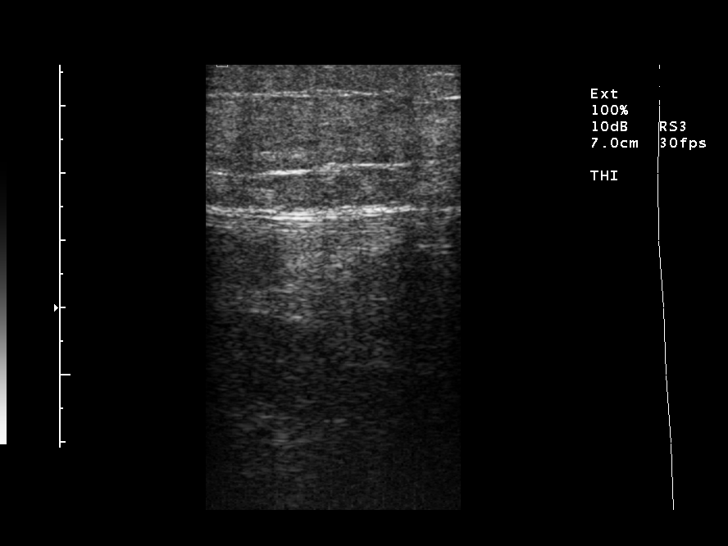
[im 23/23]
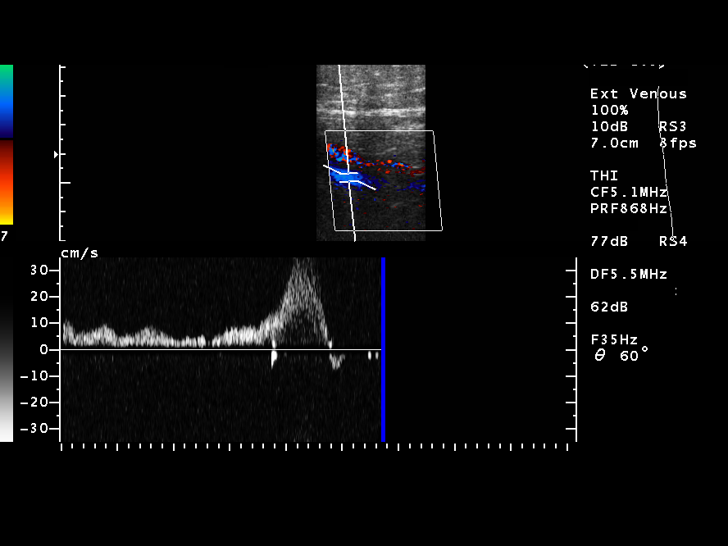

[14 of 23 positions shown; findings below may reference images not displayed]

IMPRESSION: 1. Negative for left  lower extremity DVT

## 2005-12-21 ENCOUNTER — Inpatient Hospital Stay (HOSPITAL_COMMUNITY): Admission: EM | Admit: 2005-12-21 | Discharge: 2005-12-23 | Payer: Self-pay | Admitting: Emergency Medicine

## 2005-12-28 ENCOUNTER — Encounter (INDEPENDENT_AMBULATORY_CARE_PROVIDER_SITE_OTHER): Payer: Self-pay | Admitting: Specialist

## 2005-12-28 ENCOUNTER — Ambulatory Visit (HOSPITAL_COMMUNITY): Admission: RE | Admit: 2005-12-28 | Discharge: 2005-12-28 | Payer: Self-pay | Admitting: General Surgery

## 2005-12-29 IMAGING — CR DG ABDOMEN ACUTE W/ 1V CHEST
3 series · 3 of 3 positions shown · non-contrast
Comparison: 12/09/2004

CLINICAL DATA: Abdominal pain, vomiting

ABDOMEN SERIES - 2 VIEW & CHEST - 1 VIEW

[view not recorded (1 of 3)]
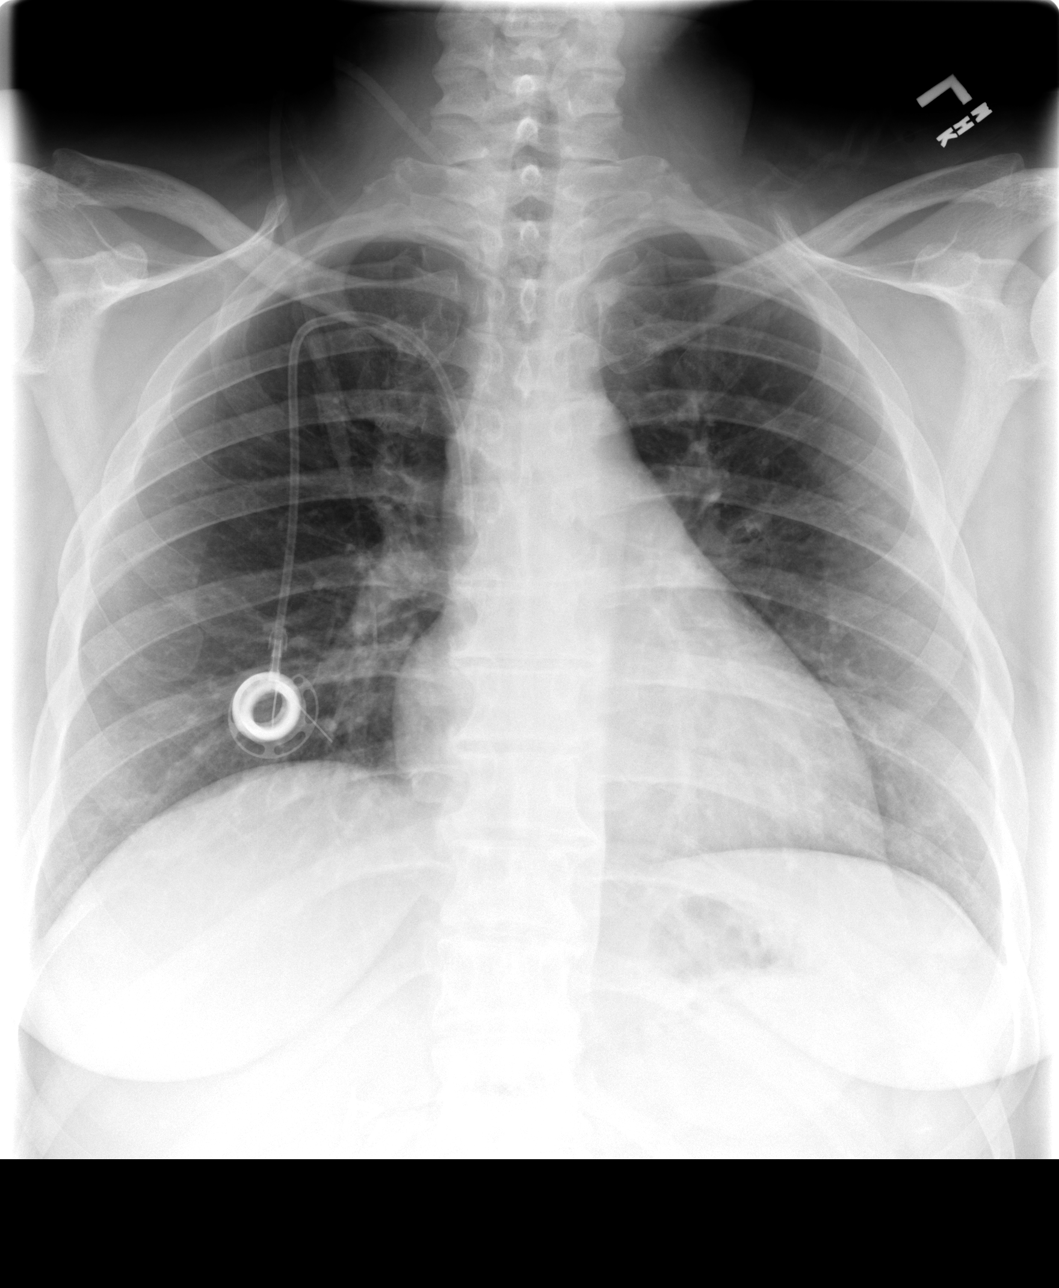

[view not recorded (2 of 3)]
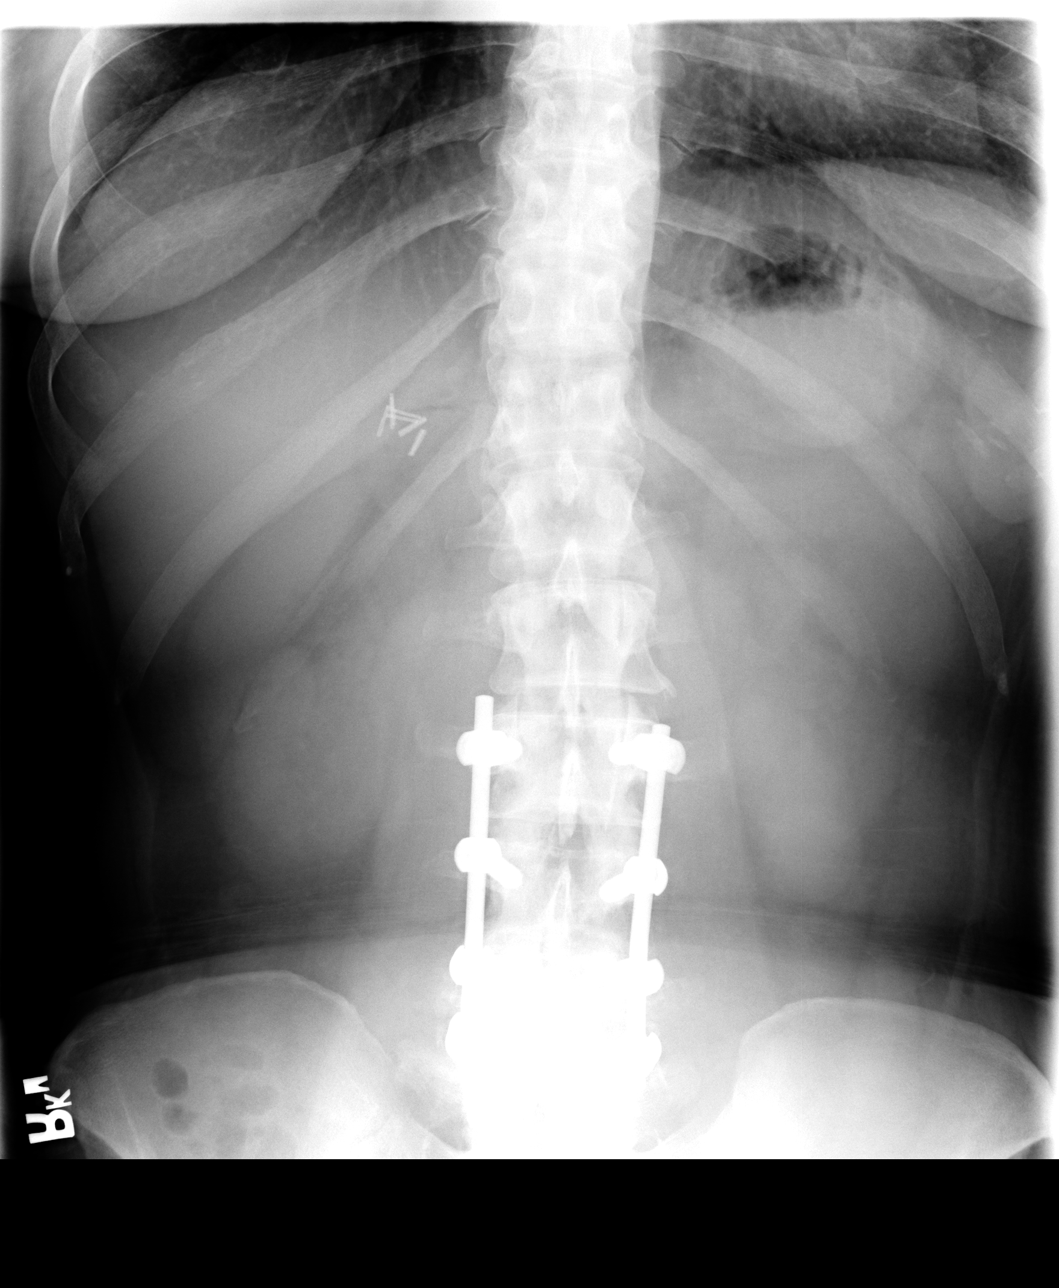

[view not recorded (3 of 3)]
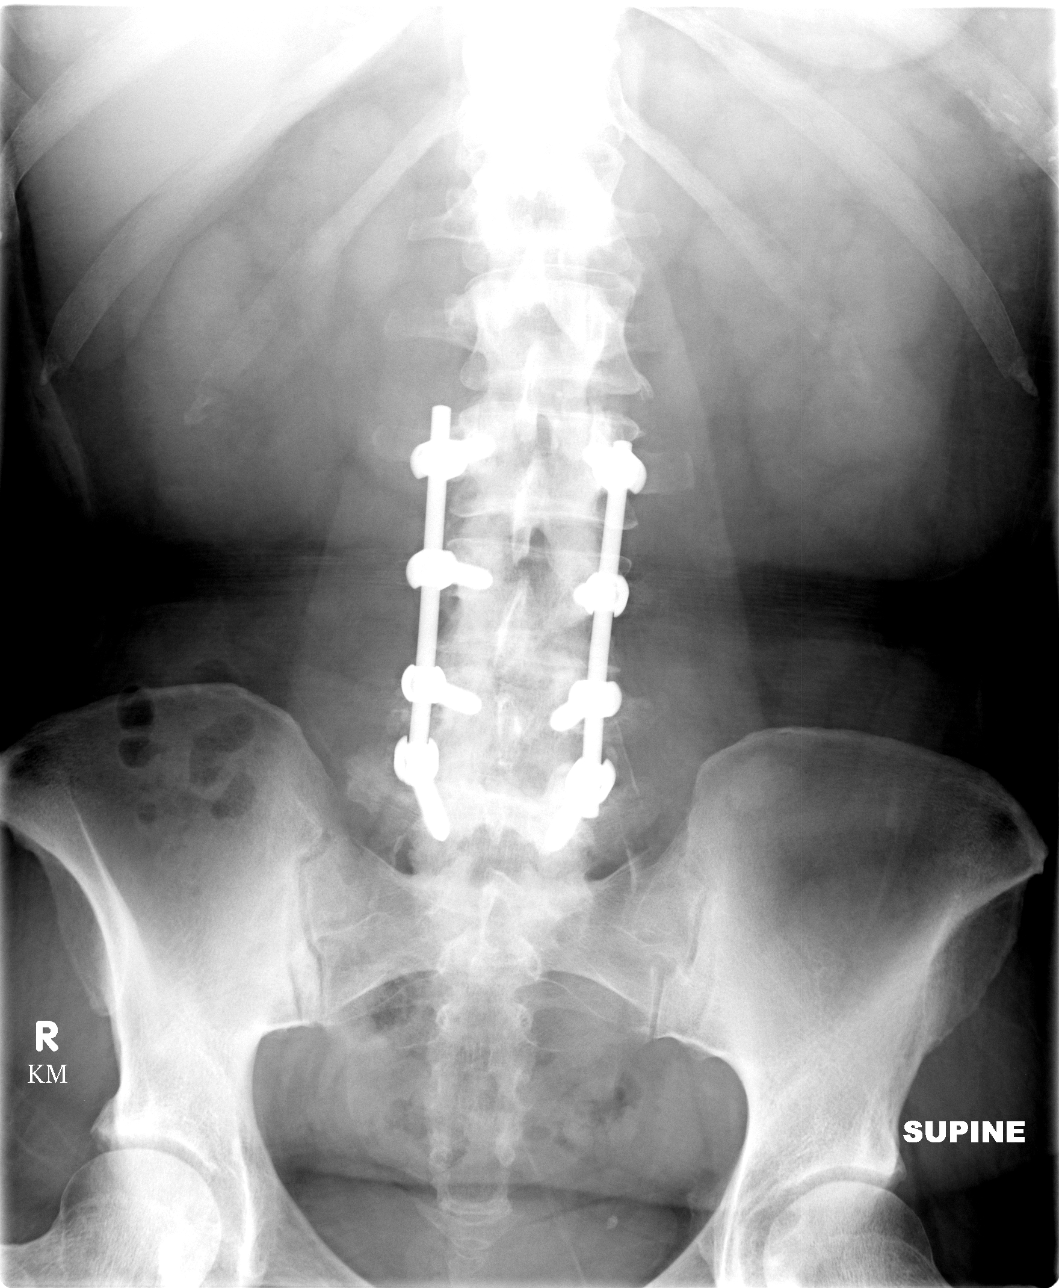

[3 of 3 positions shown; findings below may reference images not displayed]

FINDINGS: There is a relative paucity of gas throughout the abdomen. No
distended loops of bowel noted. No free air. Patient status post lumbar fusion
and cholecystectomy.

Right-sided Port-A-Cath remains in place, unchanged. Mild cardiomegaly.
Decreasing interstitial prominence within the lungs.

IMPRESSION

Relative paucity of gas throughout the abdomen. No distended loops of bowel
noted. No free air.

## 2006-01-03 ENCOUNTER — Ambulatory Visit: Payer: Self-pay | Admitting: Cardiology

## 2006-01-06 ENCOUNTER — Emergency Department (HOSPITAL_COMMUNITY): Admission: EM | Admit: 2006-01-06 | Discharge: 2006-01-06 | Payer: Self-pay | Admitting: Emergency Medicine

## 2006-01-13 IMAGING — CR DG ABDOMEN ACUTE W/ 1V CHEST
3 series · 3 of 3 positions shown · non-contrast
Comparison: 12/31/2004

CLINICAL DATA: Syncope, nausea, vomiting, chest pain

ABDOMEN SERIES - 2 VIEW & CHEST - 1 VIEW

[view not recorded (1 of 3)]
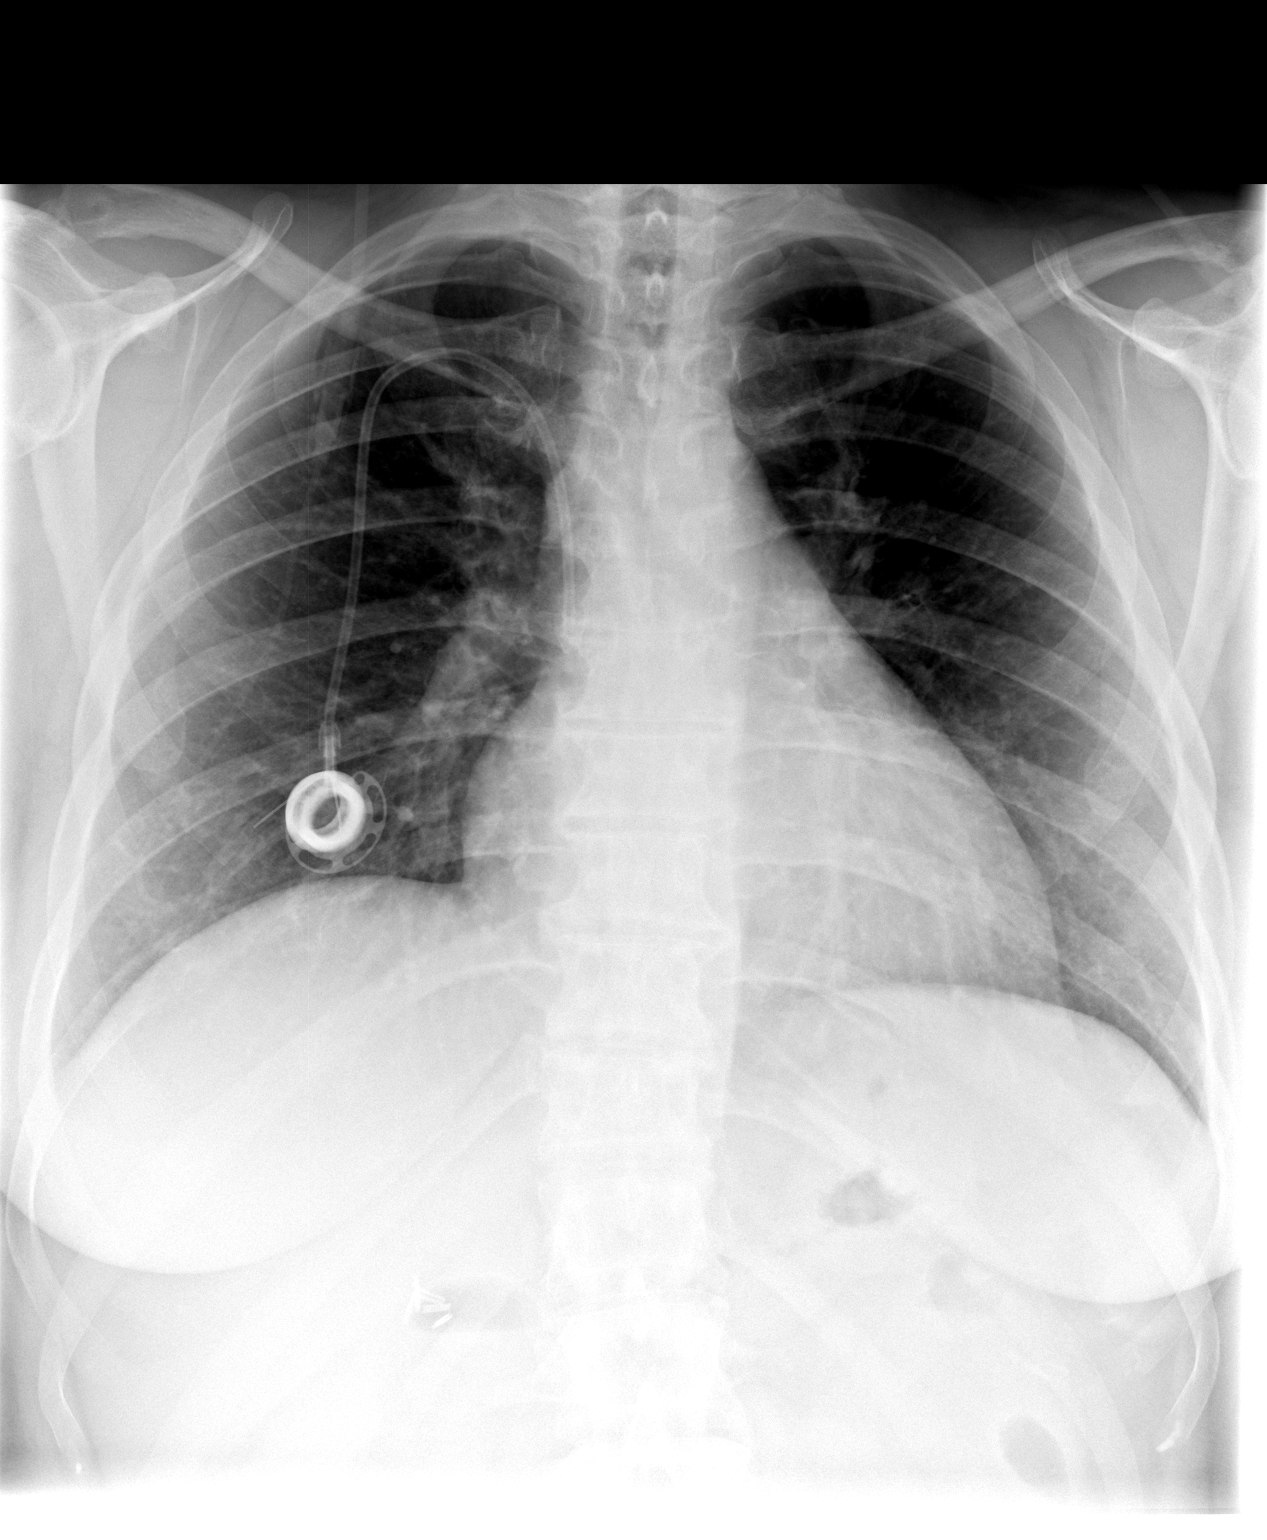

[view not recorded (2 of 3)]
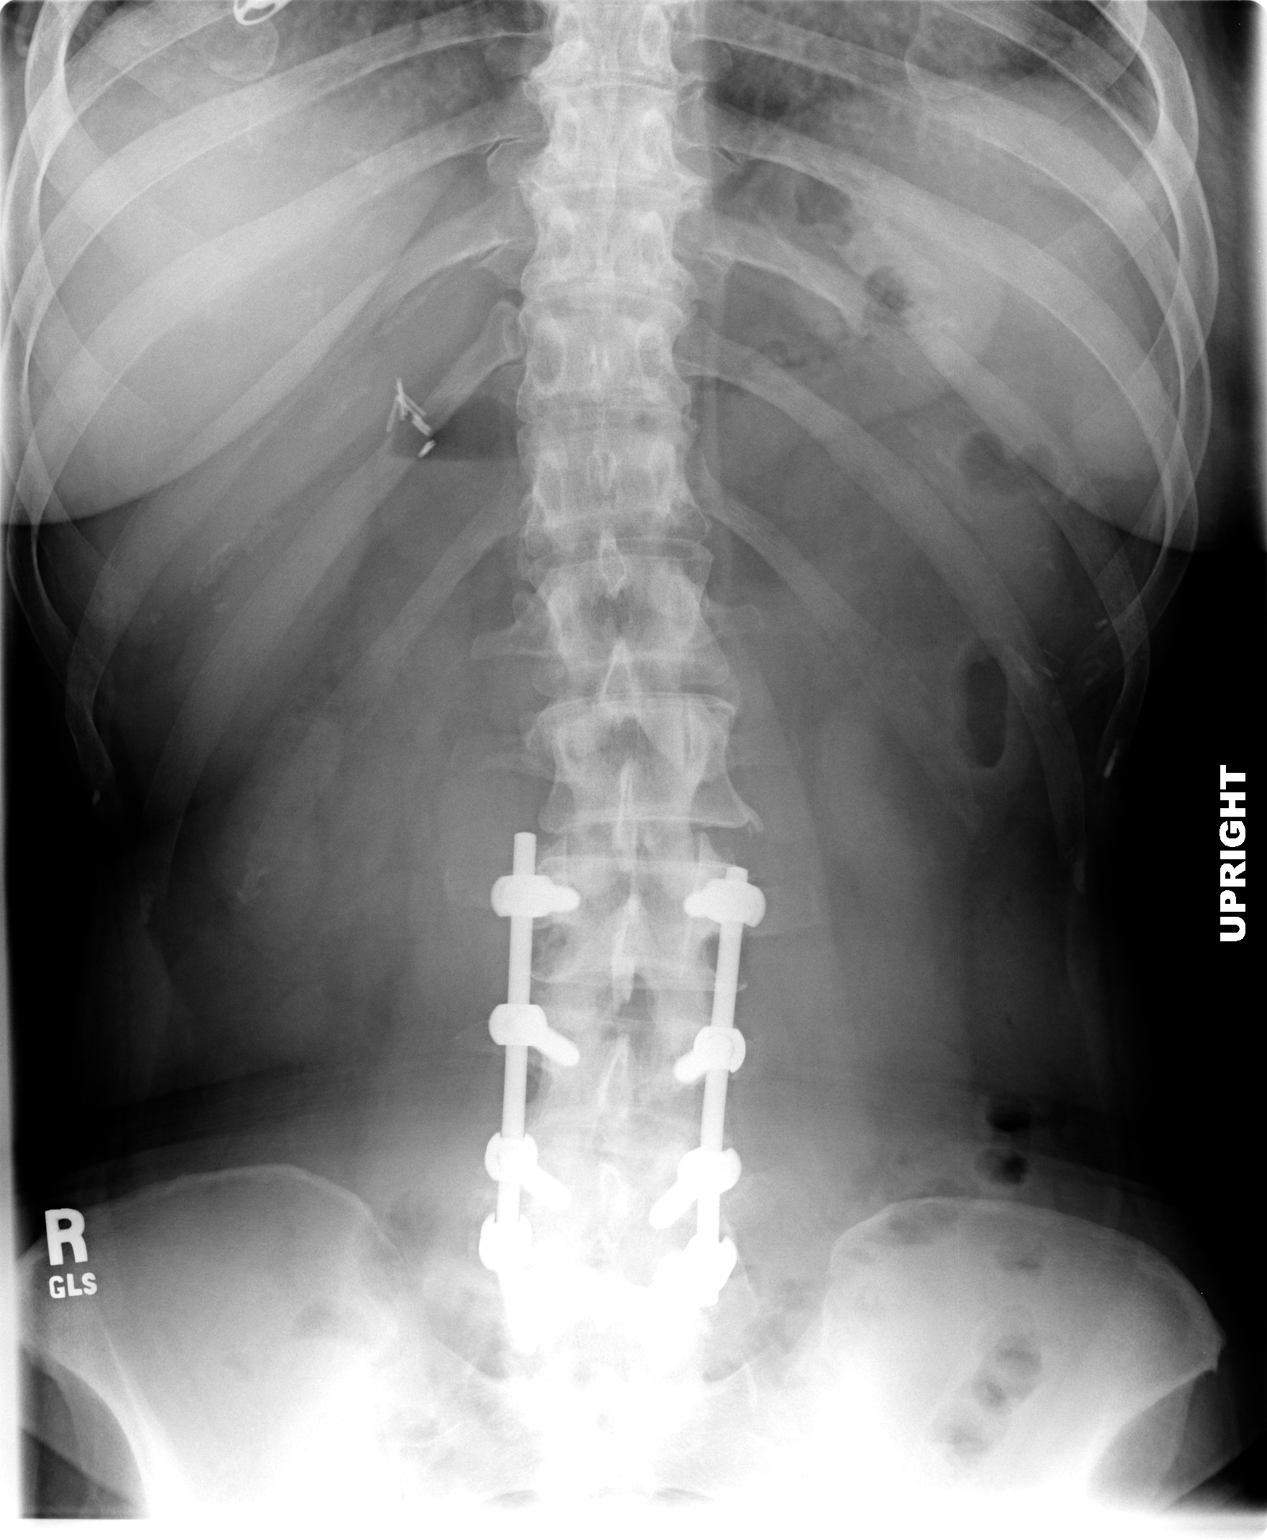

[view not recorded (3 of 3)]
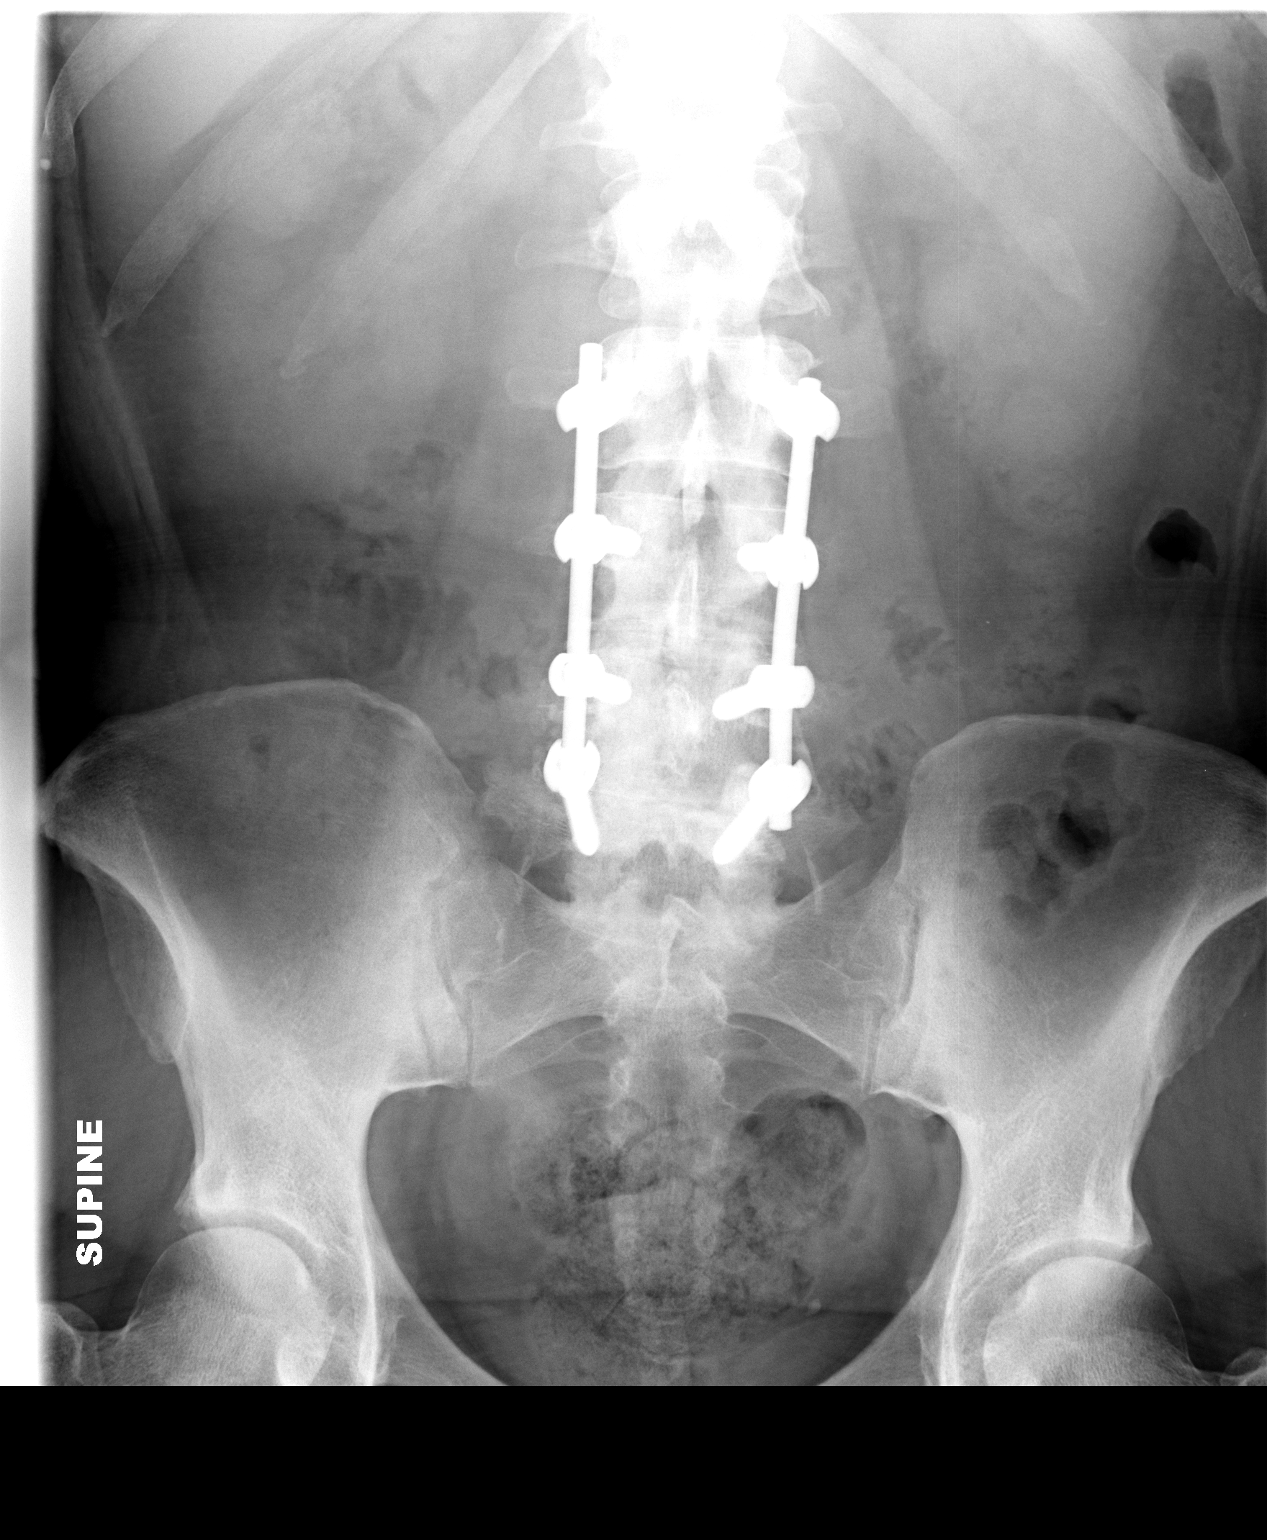

[3 of 3 positions shown; findings below may reference images not displayed]

FINDINGS: Mild cardiomegaly. Lungs are clear. Right Port-A-Cath unchanged.

Patient is status post cholecystectomy and lumbar fusion. There is a
nonobstructive bowel gas pattern. No free air. No organomegaly or suspicious
calcification.

IMPRESSION

Cardiomegaly. No obstruction or free air.

## 2006-02-13 ENCOUNTER — Ambulatory Visit: Payer: Self-pay | Admitting: Cardiology

## 2006-02-16 IMAGING — CR DG ABDOMEN ACUTE W/ 1V CHEST
4 series · 4 of 4 positions shown · non-contrast
Comparison: none

HISTORY: Abdominal pain, nausea, vomiting

[view not recorded (1 of 4)]
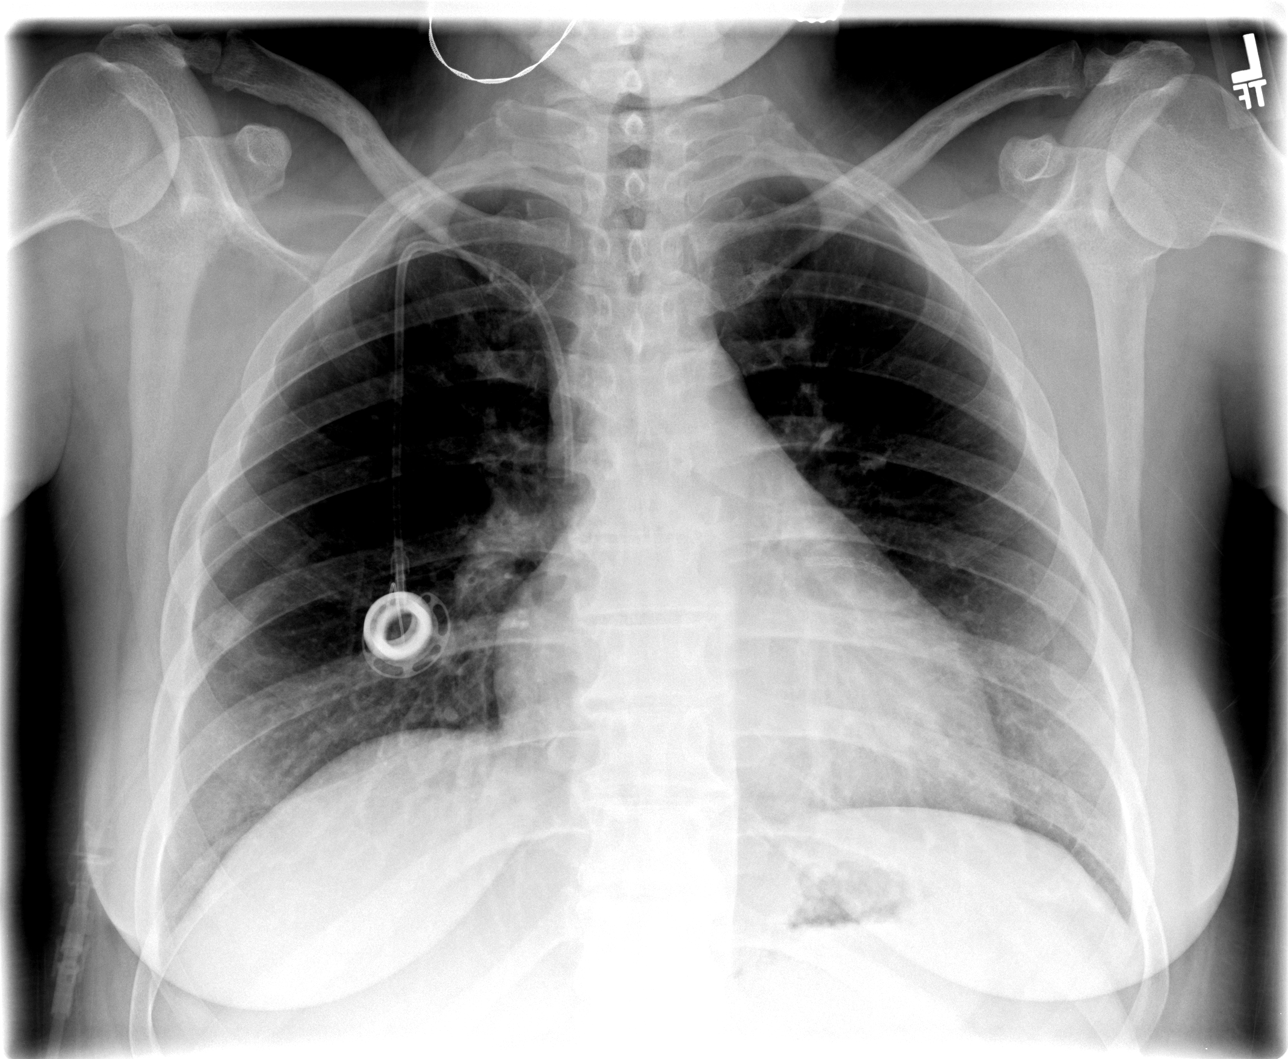

[view not recorded (2 of 4)]
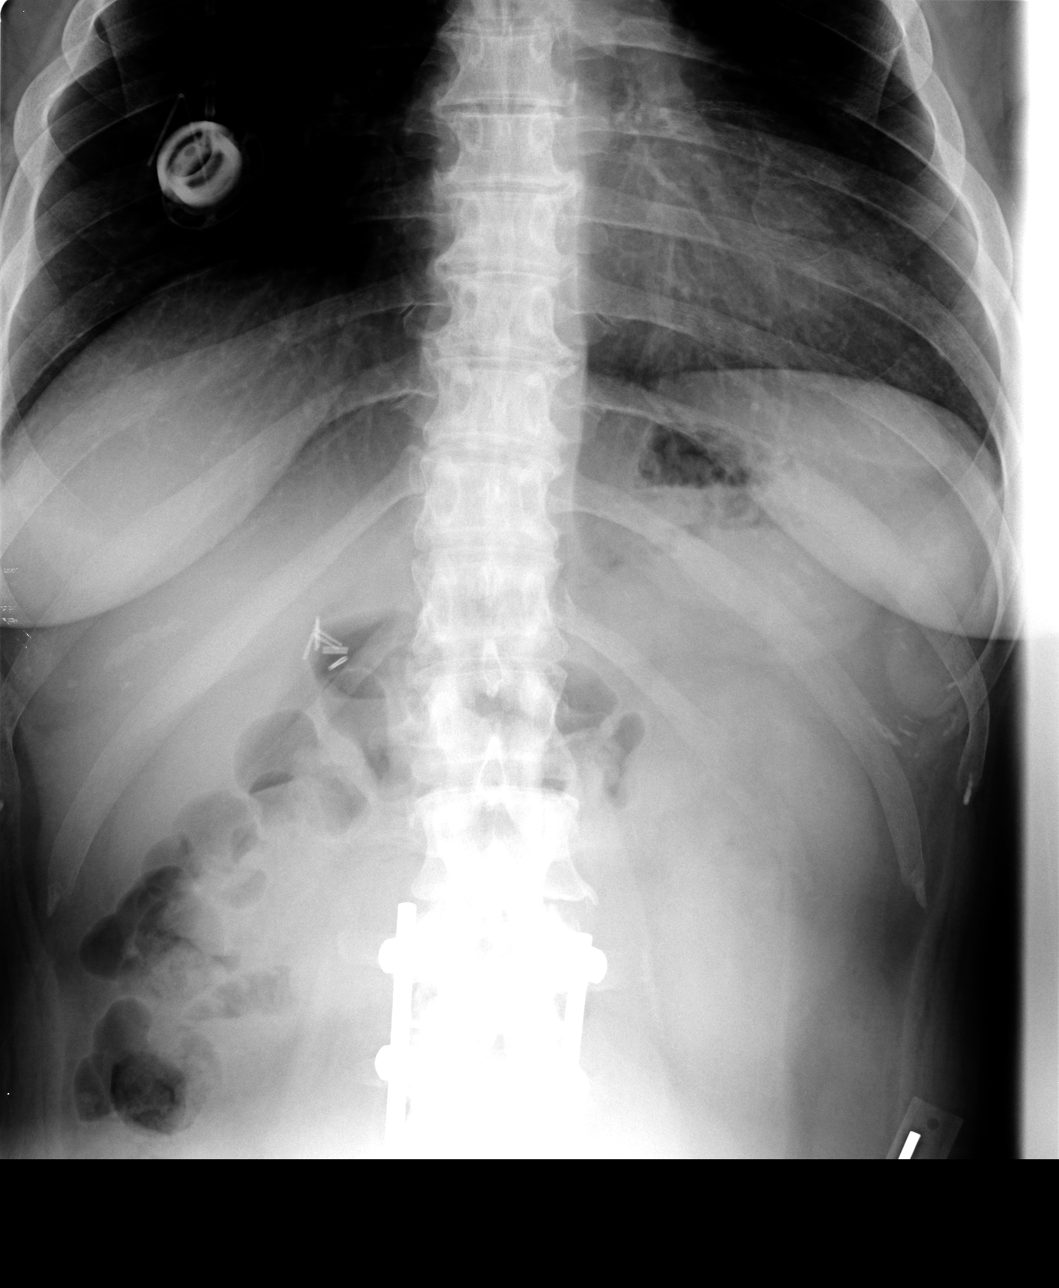

[view not recorded (3 of 4)]
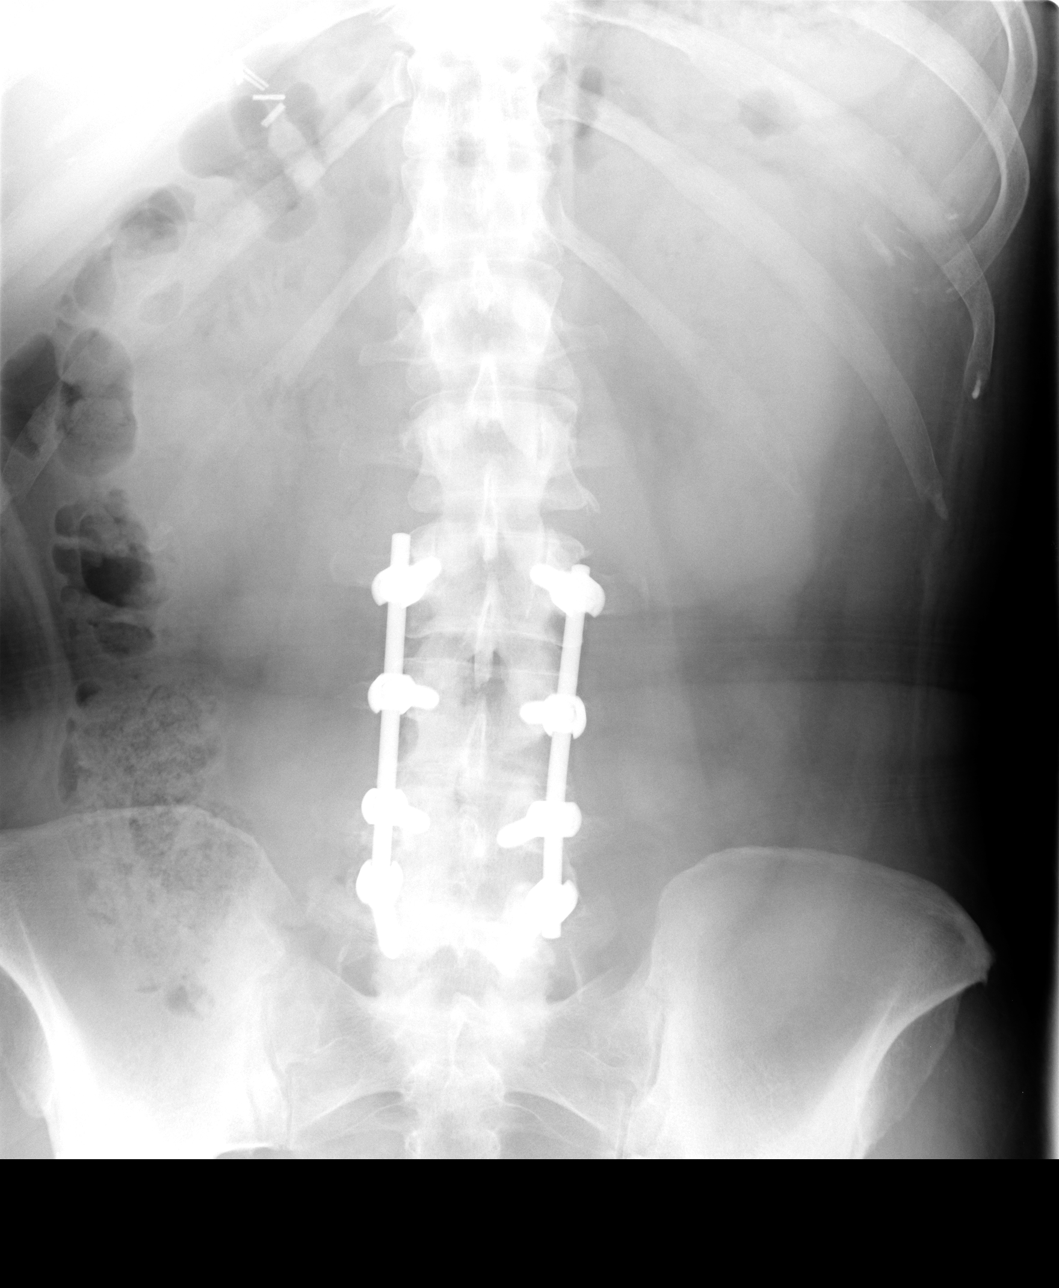

[view not recorded (4 of 4)]
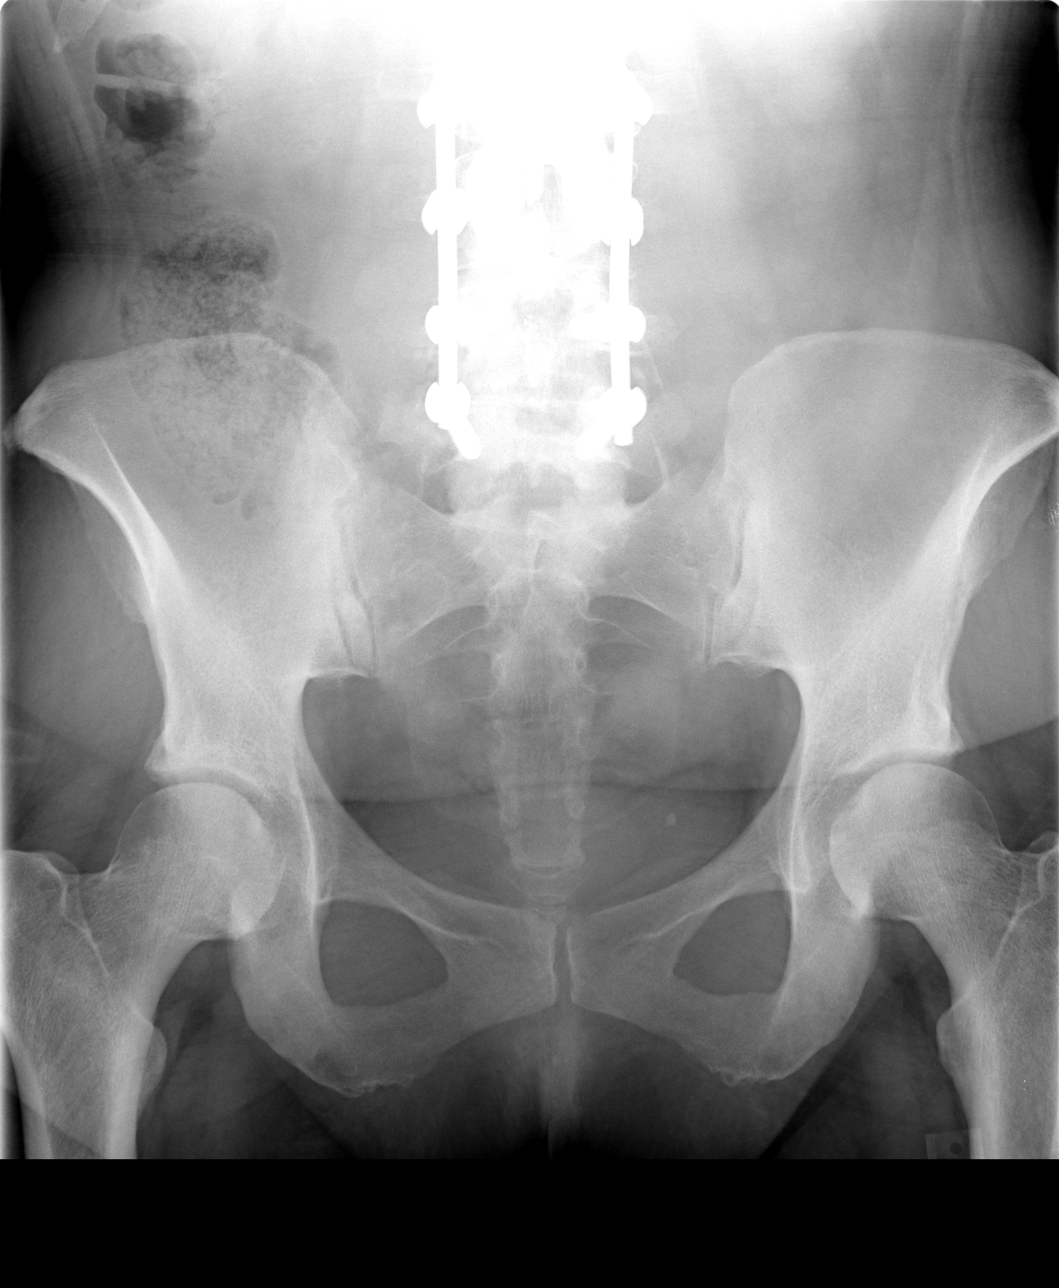

[4 of 4 positions shown; findings below may reference images not displayed]

ABDOMEN ACUTE WITH PA CHEST:

Comparison 01/15/2005

Cardiomegaly.
Normal mediastinal contours and vascularity.
Stable right subclavian Port-A-Cath.
Lungs clear.

Paucity of bowel gas.
No signs of bowel obstruction, wall thickening, or perforation.
Prior 4 level fusion in lower lumbar spine.
No urinary tract calcification vertebral lesion.
Tiny stable phlebolith left pelvis.
IMPRESSION: Cardiomegaly. No acute abdominal findings.

## 2006-02-19 ENCOUNTER — Emergency Department (HOSPITAL_COMMUNITY): Admission: EM | Admit: 2006-02-19 | Discharge: 2006-02-19 | Payer: Self-pay | Admitting: Emergency Medicine

## 2006-03-06 ENCOUNTER — Emergency Department (HOSPITAL_COMMUNITY): Admission: EM | Admit: 2006-03-06 | Discharge: 2006-03-06 | Payer: Self-pay | Admitting: Emergency Medicine

## 2006-03-21 ENCOUNTER — Emergency Department (HOSPITAL_COMMUNITY): Admission: EM | Admit: 2006-03-21 | Discharge: 2006-03-21 | Payer: Self-pay | Admitting: *Deleted

## 2006-03-24 ENCOUNTER — Inpatient Hospital Stay (HOSPITAL_COMMUNITY): Admission: EM | Admit: 2006-03-24 | Discharge: 2006-03-26 | Payer: Self-pay | Admitting: Emergency Medicine

## 2006-04-12 IMAGING — CR DG ABDOMEN ACUTE W/ 1V CHEST
3 series · 3 of 3 positions shown · non-contrast
Comparison: 02/18/05.

CLINICAL DATA: Abdominal pain.
ACUTE ABDOMINAL SERIES WITH CHEST:

[view not recorded (1 of 3)]
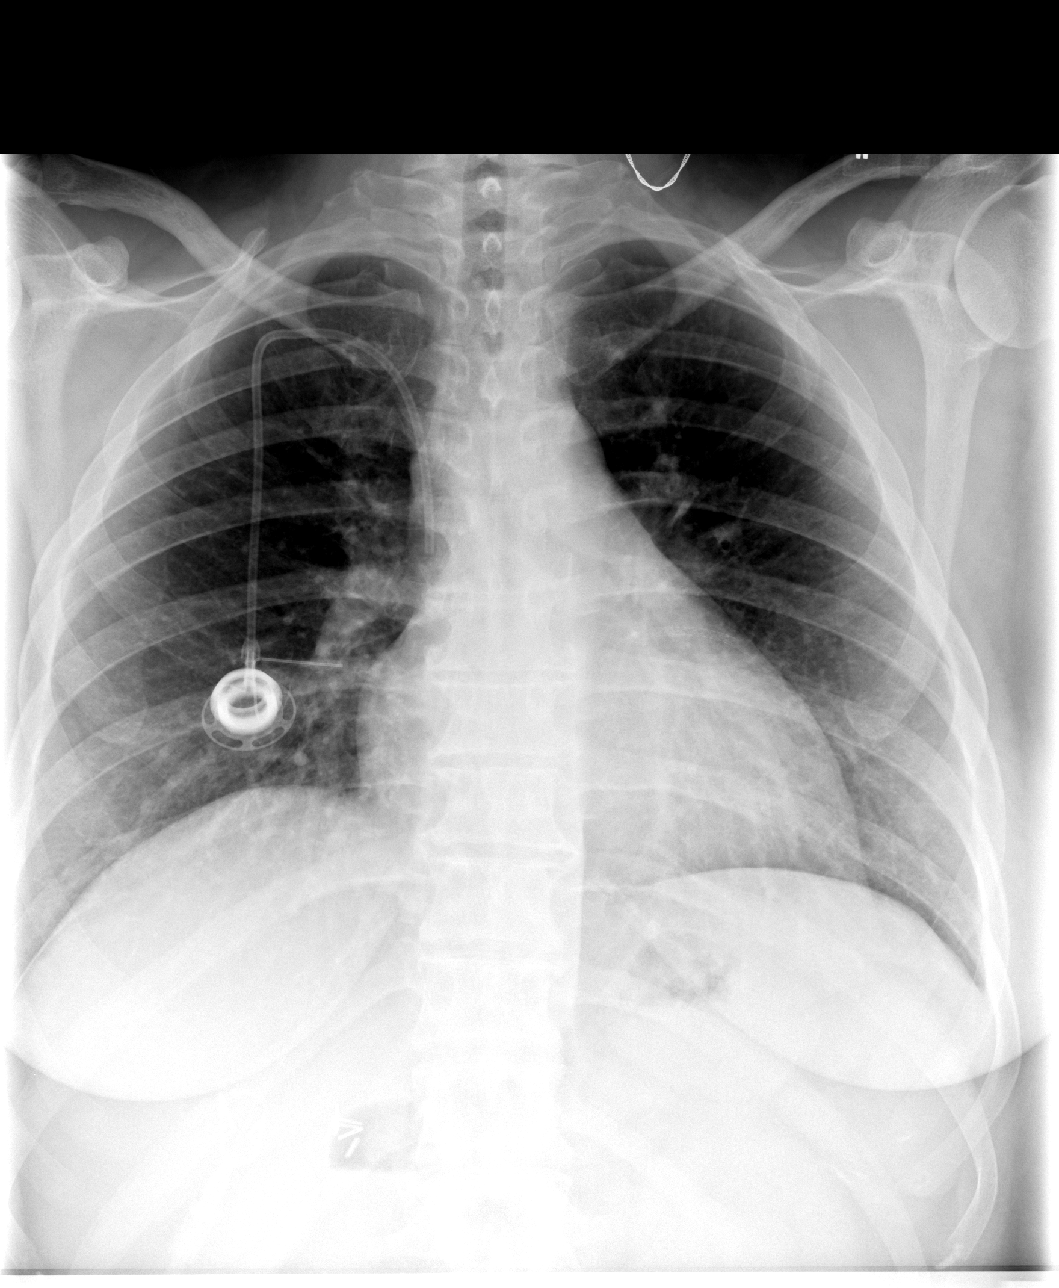

[view not recorded (2 of 3)]
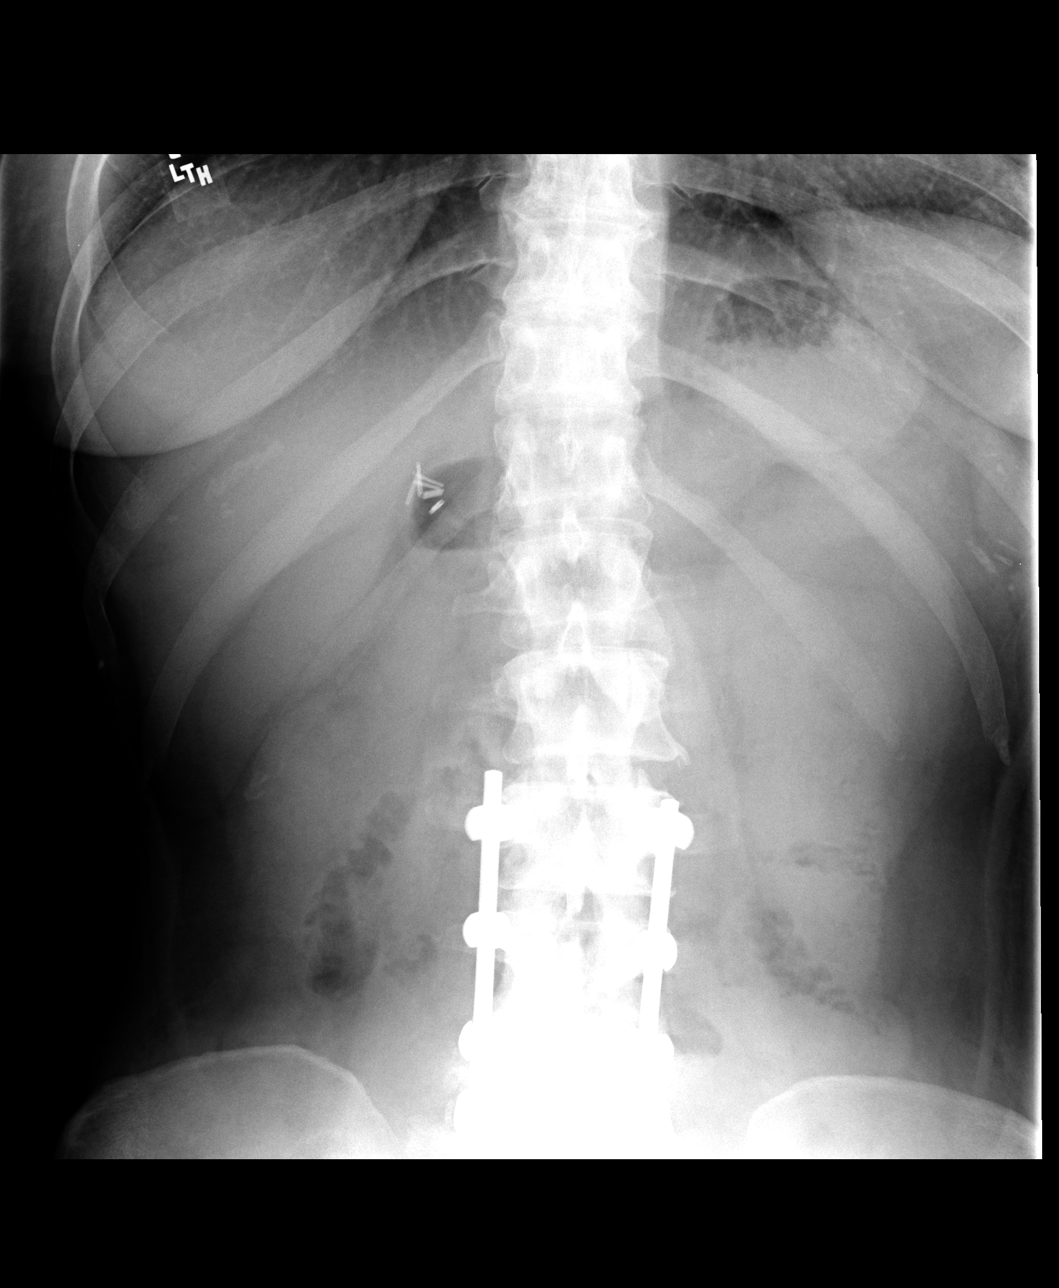

[view not recorded (3 of 3)]
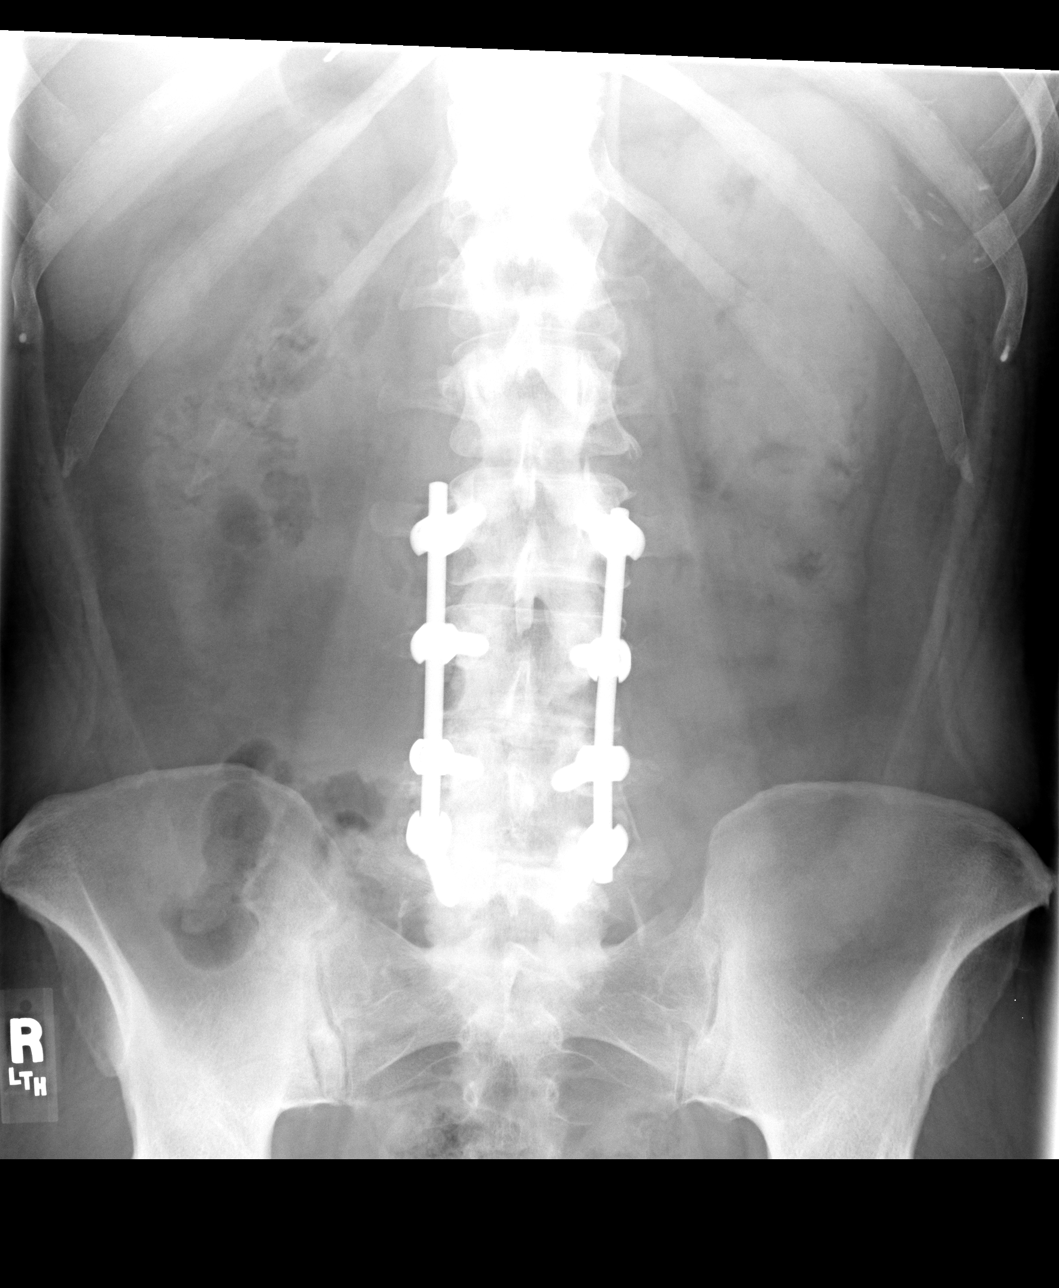

[3 of 3 positions shown; findings below may reference images not displayed]

Heart size is normal.  A right subclavian Port-A-Cath is in place.  The tip is at the cavoatrial junction.  The port is accessed.  Lungs are clear.  Bony thorax is unremarkable.
Surgical clips re-demonstrated in the right upper quadrant.  Patient is status post PLIF from L3 to S1.  The S1 segment is partially lumbarized.  There is no evidence for obstruction or free air.  Mottled stool is seen throughout the descending and rectosigmoid colon.  Bowel gas pattern is unremarkable.
IMPRESSION: 1.  Cardiomegaly without failure.
2.  No acute abdominal abnormality.
3.  Status post spinal fusion.

## 2006-04-15 ENCOUNTER — Inpatient Hospital Stay (HOSPITAL_COMMUNITY): Admission: EM | Admit: 2006-04-15 | Discharge: 2006-04-20 | Payer: Self-pay | Admitting: Emergency Medicine

## 2006-04-21 ENCOUNTER — Emergency Department (HOSPITAL_COMMUNITY): Admission: EM | Admit: 2006-04-21 | Discharge: 2006-04-21 | Payer: Self-pay | Admitting: Emergency Medicine

## 2006-04-22 ENCOUNTER — Inpatient Hospital Stay (HOSPITAL_COMMUNITY): Admission: EM | Admit: 2006-04-22 | Discharge: 2006-04-24 | Payer: Self-pay | Admitting: Emergency Medicine

## 2006-05-17 IMAGING — CR DG CHEST 1V PORT
1 series · 1 of 1 positions shown · non-contrast
Comparison: 10/17/04.

CLINICAL DATA: Chest pain.  Vomiting.  
 PORTABLE CHEST - 1 VIEW 05/19/05 AT 8427 HOURS:

[view not recorded]
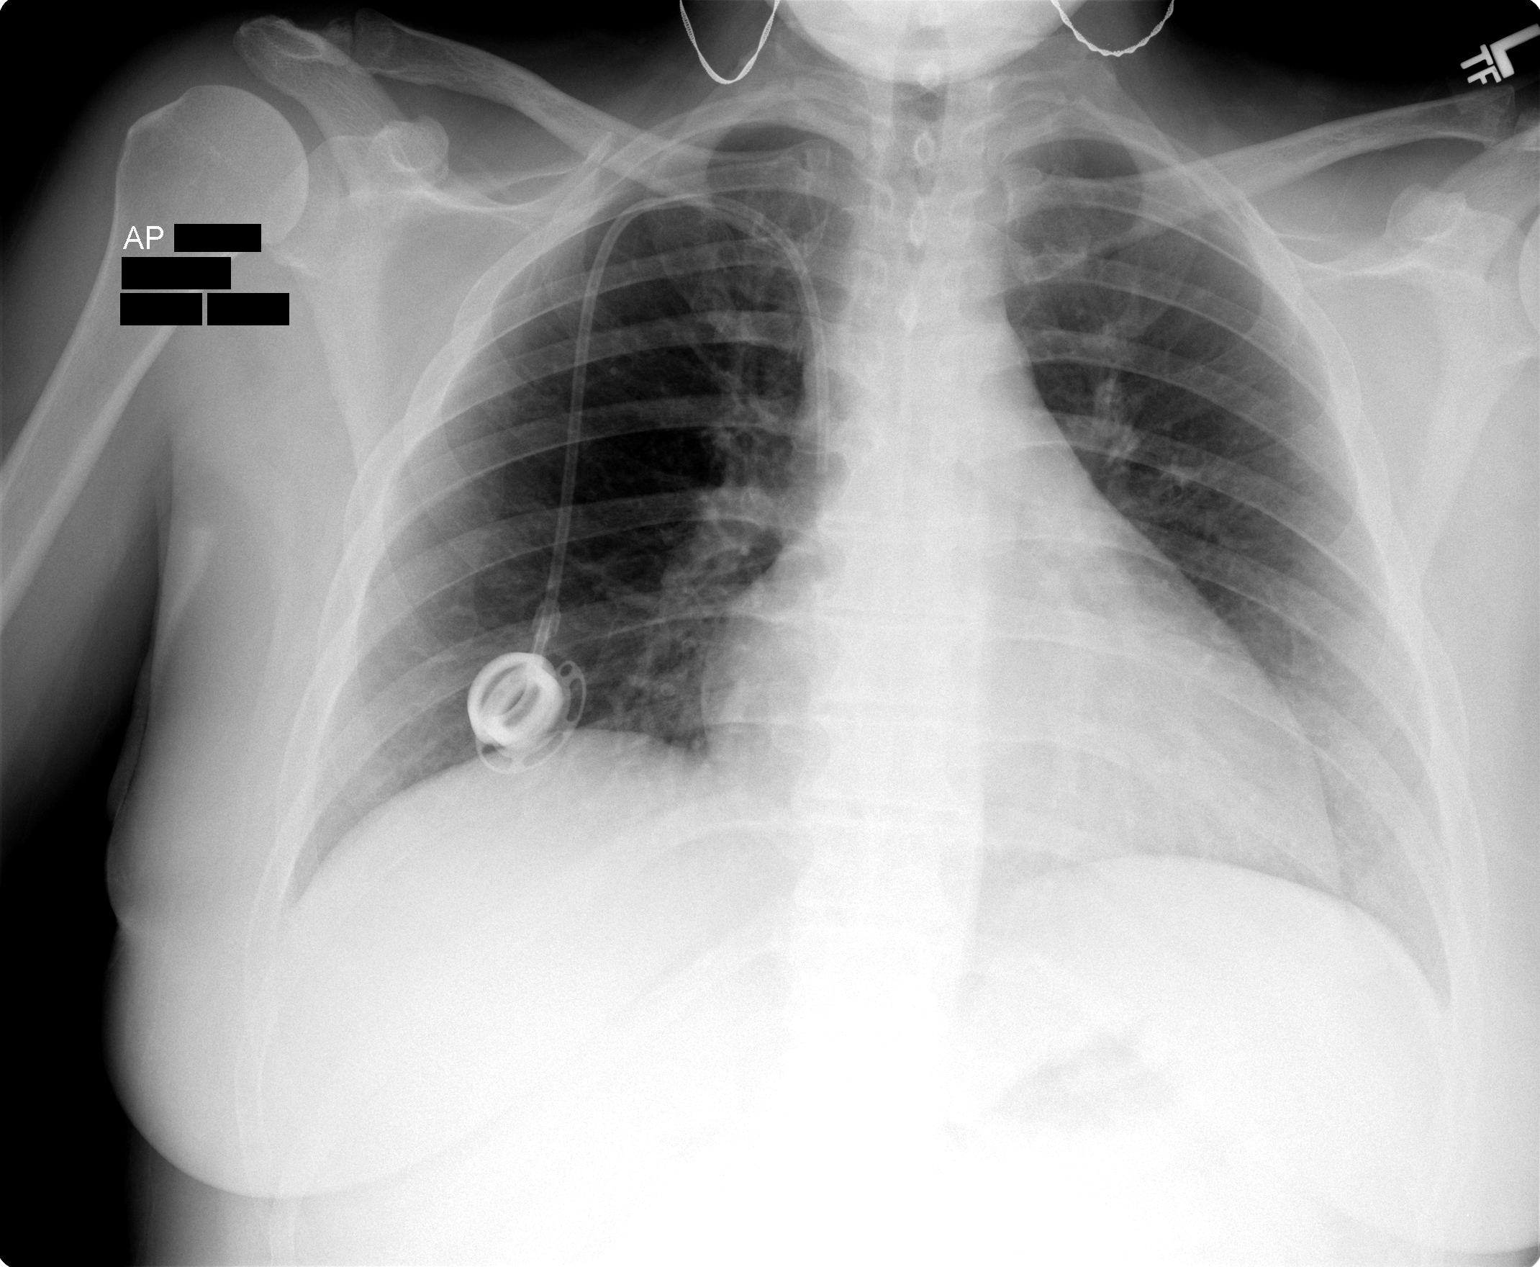

[1 of 1 positions shown; findings below may reference images not displayed]

FINDINGS: The heart is mildly enlarged.  Port-a-cath is in place with the tip in the SVC.  The lungs are clear and the vascularity is normal.
IMPRESSION: No active disease.

## 2006-05-18 IMAGING — CR DG ABDOMEN ACUTE W/ 1V CHEST
3 series · 3 of 3 positions shown · non-contrast
Comparison: Abdominal radiograph series on 04/14/05.

CLINICAL DATA: Hypoactive bowel sounds.  Nausea, vomiting, and diarrhea. 
 ACUTE ABDOMINAL SERIES:

[view not recorded (1 of 3)]
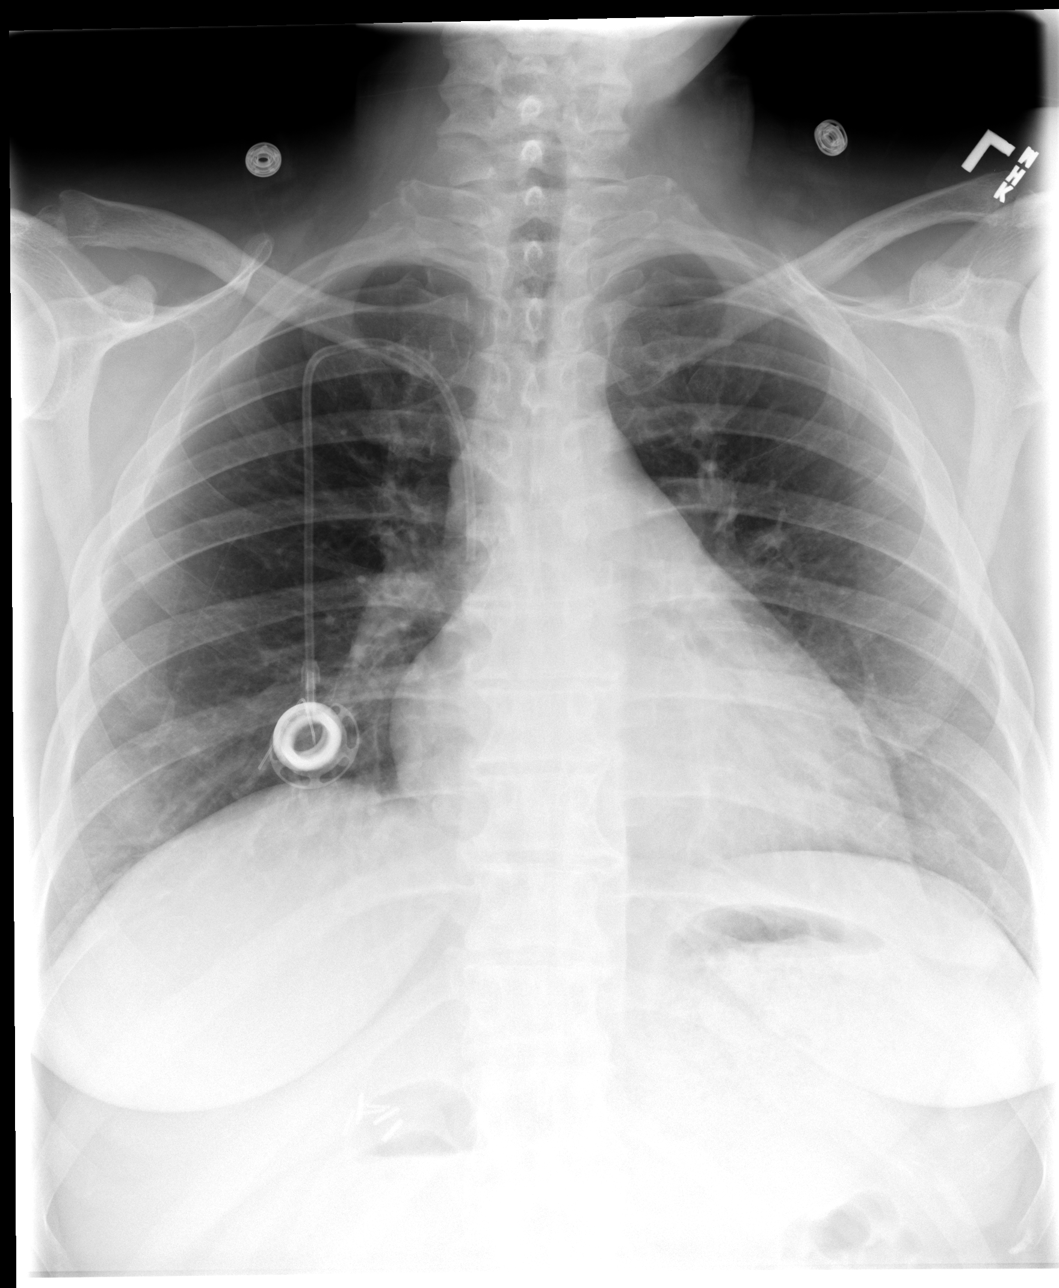

[view not recorded (2 of 3)]
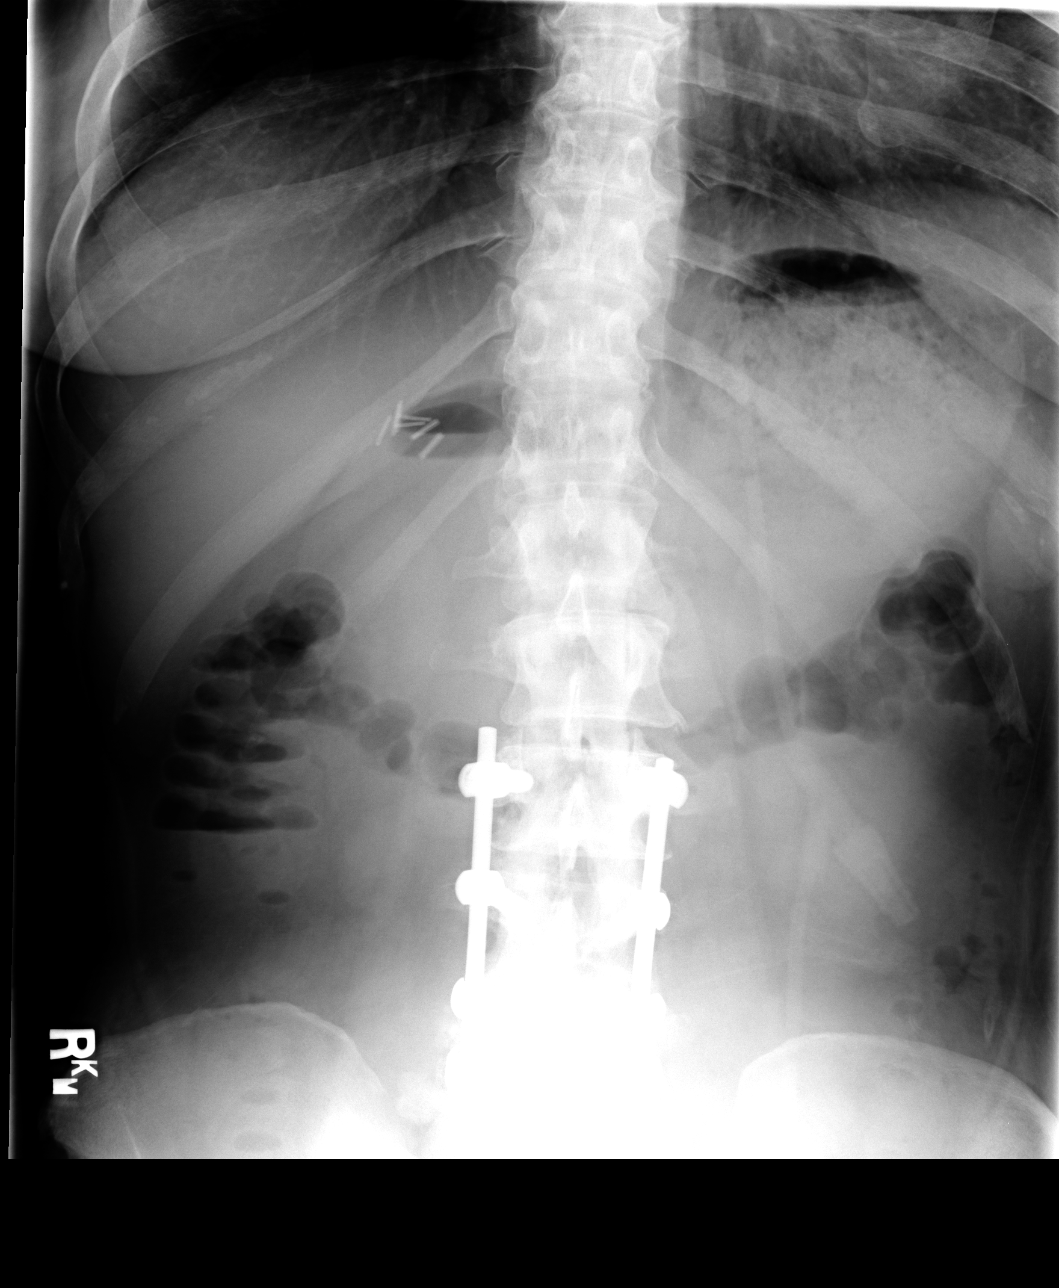

[view not recorded (3 of 3)]
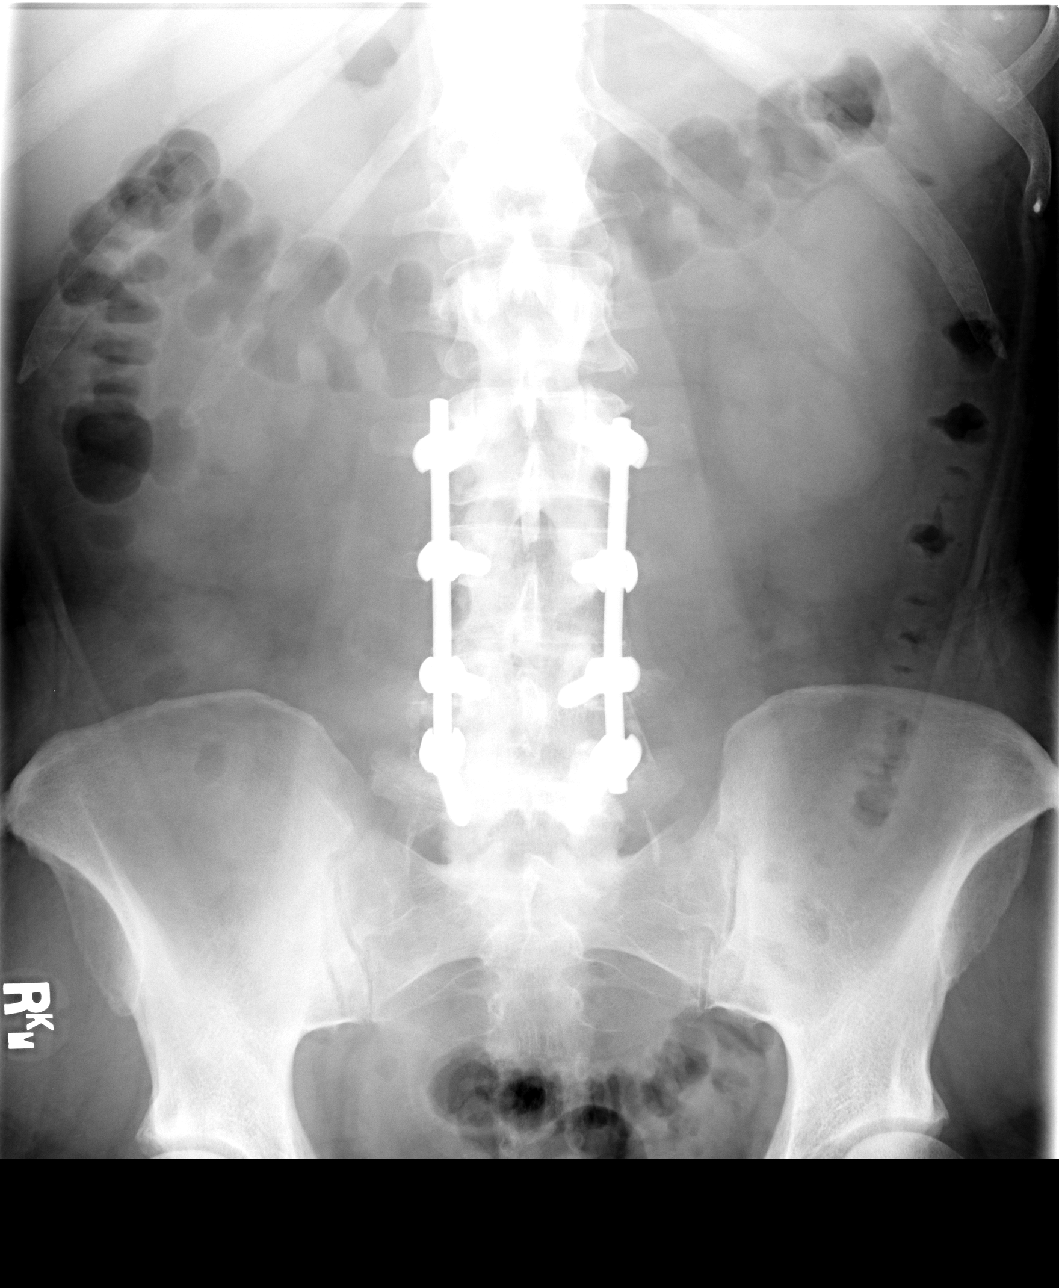

[3 of 3 positions shown; findings below may reference images not displayed]

FINDINGS: Right-sided Port-A-Cath terminates with its tip over the SVC.  Cholecystectomy clips are again in place.  Heart size is normal, accounting for low lung volumes.  The lungs are grossly clear.  No free air is seen beneath the diaphragms; apparent lucency beneath the left hemidiaphragm is due to companion shadow of the left posterior 9th rib. 
 Abdomen:  Gas is seen within nondistended colon.  No dilated loop of small bowel is seen.  Posterior spinal fusion hardware is again noted.
IMPRESSION: 1.  No radiographic evidence for small bowel obstruction or perforation.
 2.  Multiple fluid-filled loops of small bowel could be radiographically occult on this study.
 3.  No acute cardiopulmonary process.

## 2006-05-19 ENCOUNTER — Emergency Department (HOSPITAL_COMMUNITY): Admission: EM | Admit: 2006-05-19 | Discharge: 2006-05-20 | Payer: Self-pay | Admitting: Emergency Medicine

## 2006-06-09 ENCOUNTER — Emergency Department (HOSPITAL_COMMUNITY): Admission: EM | Admit: 2006-06-09 | Discharge: 2006-06-09 | Payer: Self-pay | Admitting: *Deleted

## 2006-06-11 ENCOUNTER — Emergency Department (HOSPITAL_COMMUNITY): Admission: EM | Admit: 2006-06-11 | Discharge: 2006-06-12 | Payer: Self-pay | Admitting: Emergency Medicine

## 2006-06-18 ENCOUNTER — Ambulatory Visit (HOSPITAL_COMMUNITY): Payer: Self-pay | Admitting: Family Medicine

## 2006-06-18 ENCOUNTER — Encounter (HOSPITAL_COMMUNITY): Admission: RE | Admit: 2006-06-18 | Discharge: 2006-07-18 | Payer: Self-pay | Admitting: Oncology

## 2006-06-19 ENCOUNTER — Emergency Department (HOSPITAL_COMMUNITY): Admission: EM | Admit: 2006-06-19 | Discharge: 2006-06-19 | Payer: Self-pay | Admitting: Emergency Medicine

## 2006-06-20 ENCOUNTER — Inpatient Hospital Stay (HOSPITAL_COMMUNITY): Admission: EM | Admit: 2006-06-20 | Discharge: 2006-06-22 | Payer: Self-pay | Admitting: Emergency Medicine

## 2006-06-25 IMAGING — US US EXTREM LOW VENOUS BILAT
1 series · 14 of 24 positions shown · non-contrast
Comparison: none

HISTORY: Persistent bilateral leg swelling left greater than right, question DVT

[Series 1: unknown · 14 of 49 slices shown]
[im 1/49]
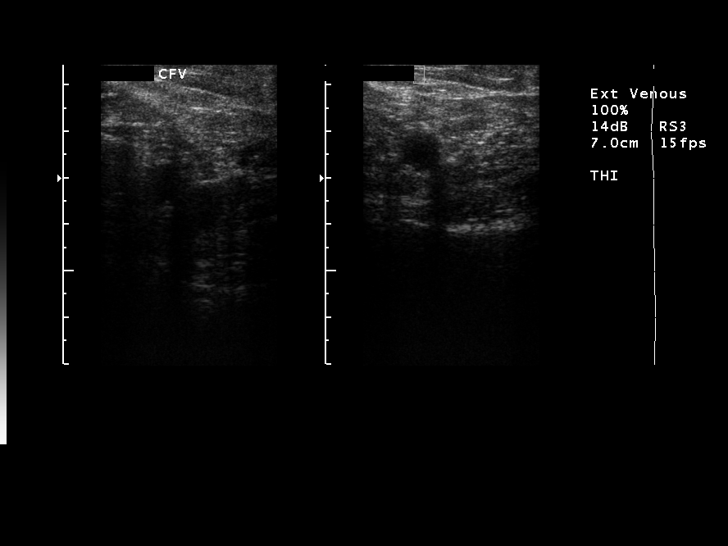
[im 5/49]
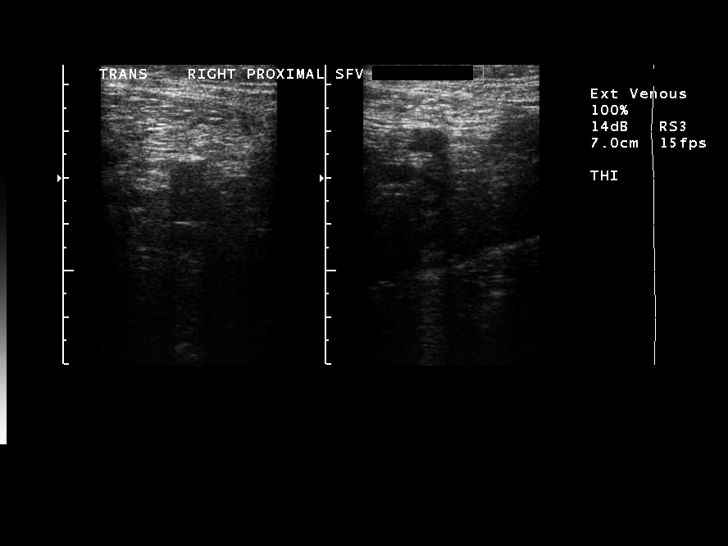
[im 9/49]
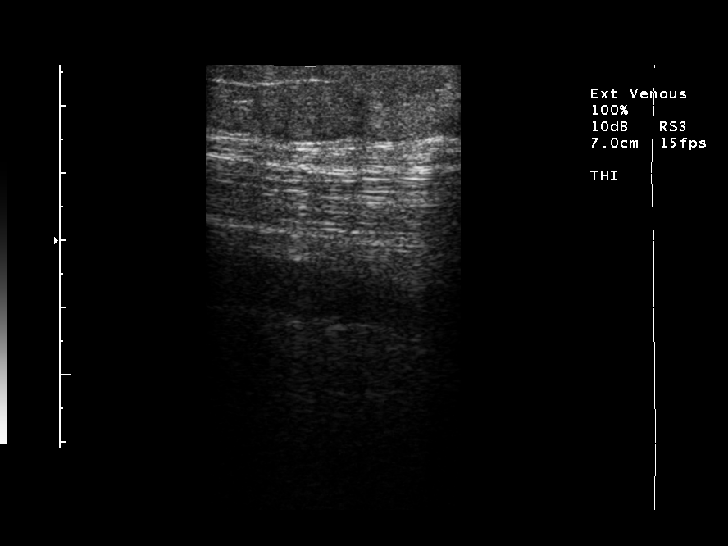
[im 13/49]
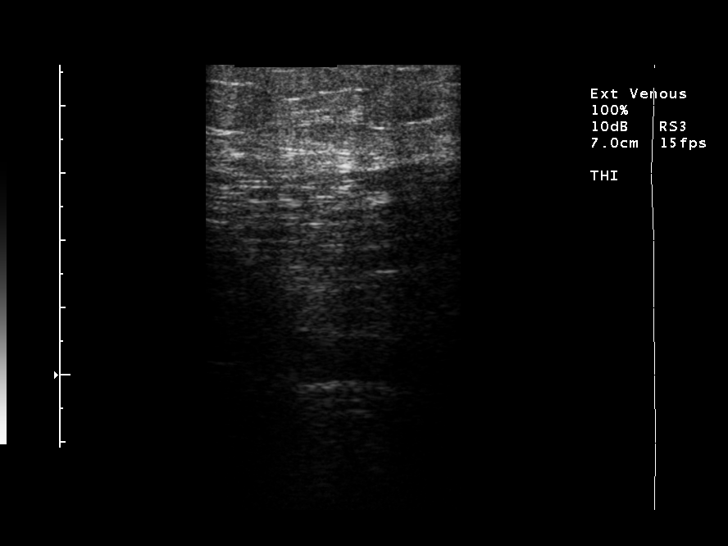
[im 15/49]
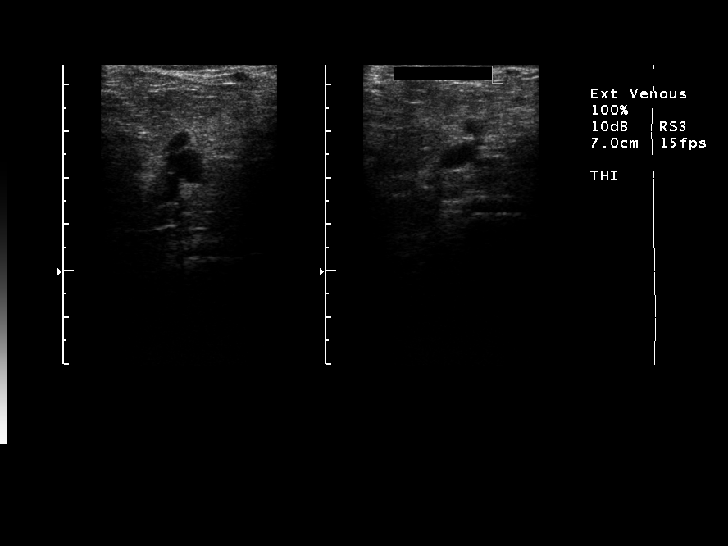
[im 19/49]
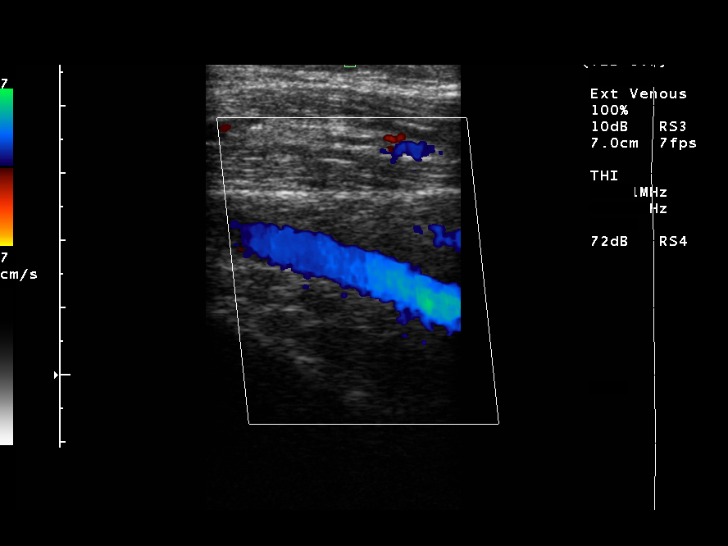
[im 23/49]
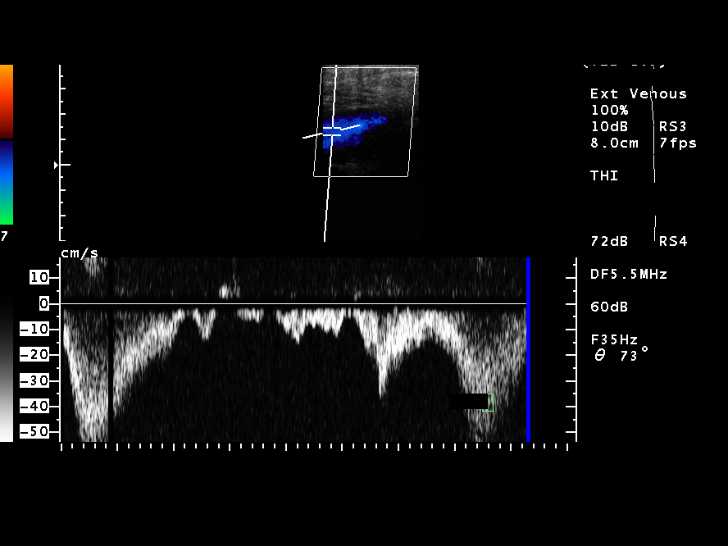
[im 26/49]
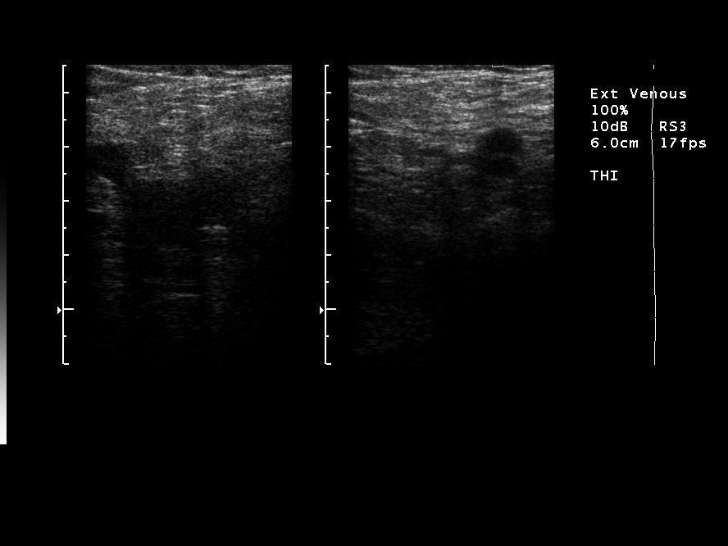
[im 30/49]
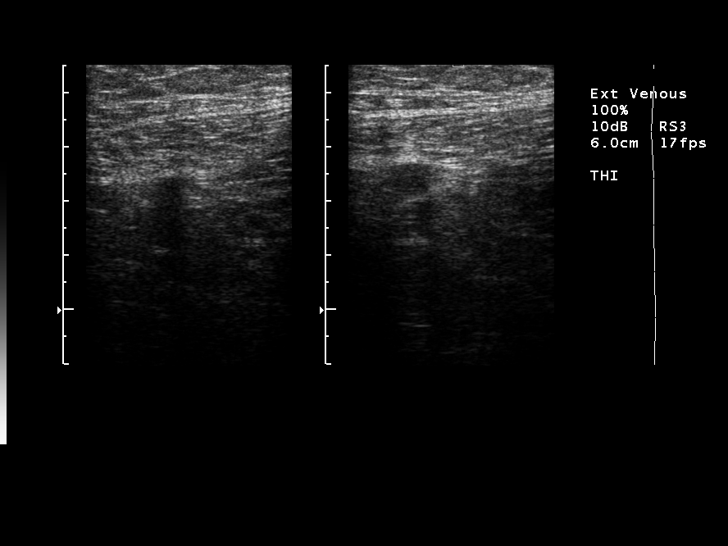
[im 34/49]
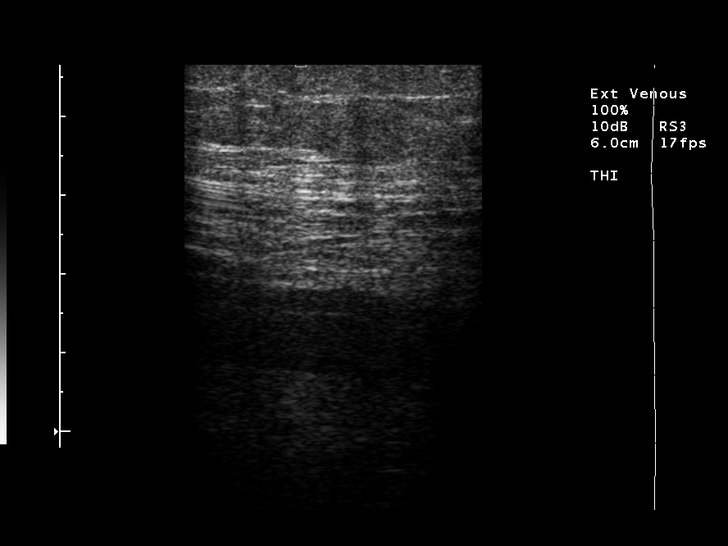
[im 38/49]
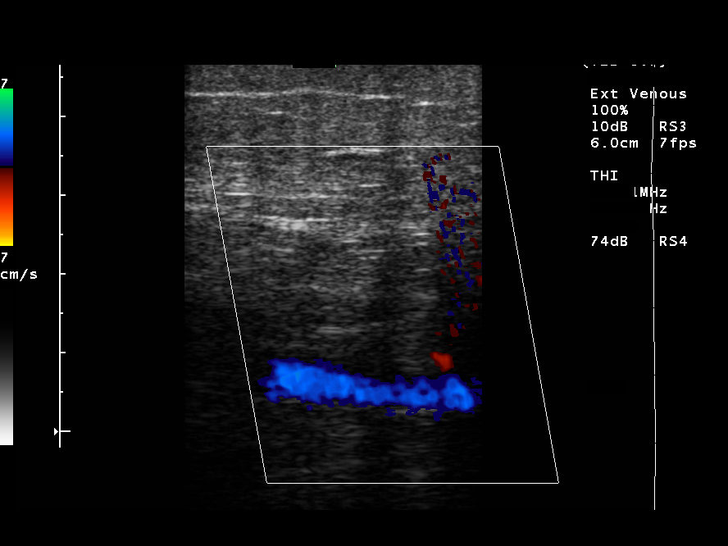
[im 40/49]
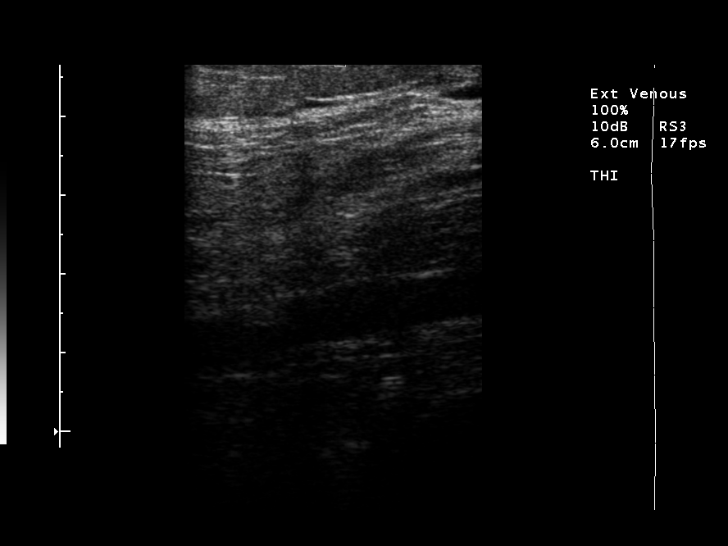
[im 44/49]
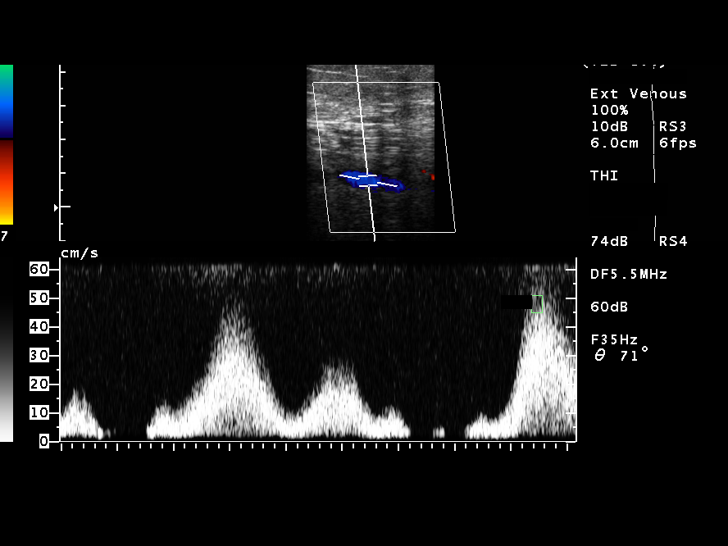
[im 49/49]
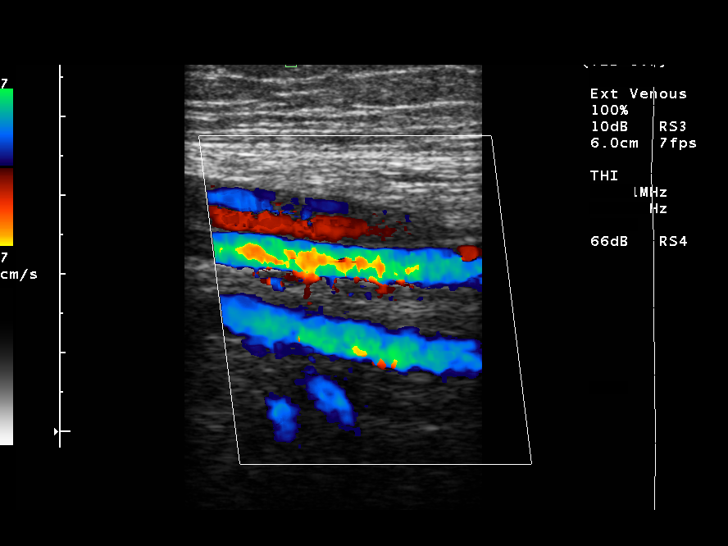

[14 of 24 positions shown; findings below may reference images not displayed]

ULTRASOUND VENOUS DUPLEX IMAGING BILATERAL LOWER EXTREMITIES:

Deep venous system patent and compressible from groin through popliteal fossa
bilaterally. 
Spontaneous venous flow present with intact augmentation and evidence of
respiratory phasicity. 
No intraluminal thrombus identified.
Saphenous and profunda femoral veins appear patent bilaterally.
IMPRESSION: No evidence of deep venous thrombosis.

## 2006-07-16 ENCOUNTER — Ambulatory Visit: Payer: Self-pay | Admitting: Internal Medicine

## 2006-07-29 ENCOUNTER — Emergency Department (HOSPITAL_COMMUNITY): Admission: EM | Admit: 2006-07-29 | Discharge: 2006-07-29 | Payer: Self-pay | Admitting: Emergency Medicine

## 2006-07-30 ENCOUNTER — Encounter (HOSPITAL_COMMUNITY): Admission: RE | Admit: 2006-07-30 | Discharge: 2006-08-26 | Payer: Self-pay | Admitting: Family Medicine

## 2006-08-01 ENCOUNTER — Ambulatory Visit: Payer: Self-pay | Admitting: Orthopedic Surgery

## 2006-08-15 ENCOUNTER — Ambulatory Visit: Payer: Self-pay | Admitting: Orthopedic Surgery

## 2006-09-04 ENCOUNTER — Encounter (HOSPITAL_COMMUNITY): Admission: RE | Admit: 2006-09-04 | Discharge: 2006-10-04 | Payer: Self-pay | Admitting: Family Medicine

## 2006-09-08 ENCOUNTER — Emergency Department (HOSPITAL_COMMUNITY): Admission: EM | Admit: 2006-09-08 | Discharge: 2006-09-08 | Payer: Self-pay | Admitting: Emergency Medicine

## 2006-09-21 ENCOUNTER — Emergency Department (HOSPITAL_COMMUNITY): Admission: EM | Admit: 2006-09-21 | Discharge: 2006-09-22 | Payer: Self-pay | Admitting: Emergency Medicine

## 2006-09-22 ENCOUNTER — Emergency Department (HOSPITAL_COMMUNITY): Admission: EM | Admit: 2006-09-22 | Discharge: 2006-09-22 | Payer: Self-pay | Admitting: Emergency Medicine

## 2006-10-03 ENCOUNTER — Ambulatory Visit: Payer: Self-pay | Admitting: Orthopedic Surgery

## 2006-10-26 IMAGING — CR DG CHEST 1V PORT
1 series · 1 of 1 positions shown · non-contrast
Comparison: 05/19/05.

CLINICAL DATA: Chest pain.  Shortness of breath.  
 PORTABLE CHEST - 1 VIEW 10/28/05:

[view not recorded]
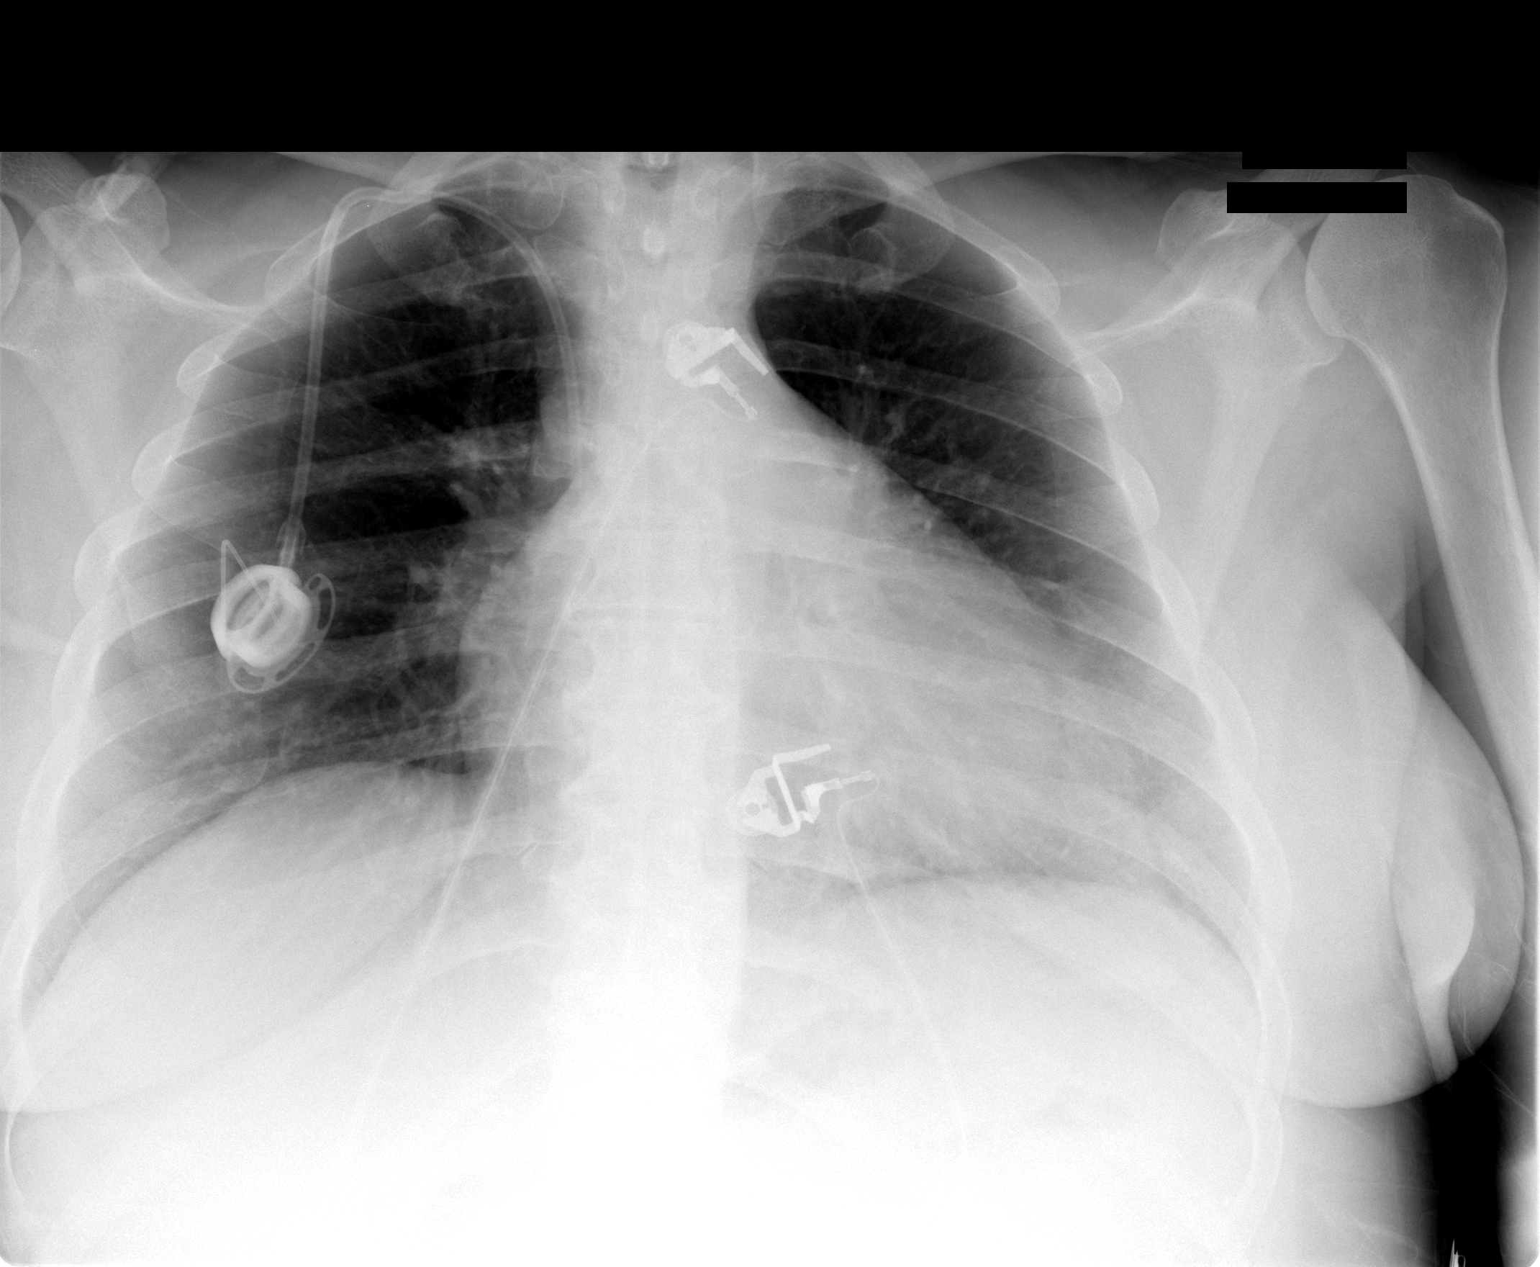

[1 of 1 positions shown; findings below may reference images not displayed]

FINDINGS: There is right-sided port-a-cath which is unchanged in position with tip over the low superior vena cava.  Midline trachea.  Cardiomegaly which is accentuated by poor inspiratory effort.  No focal airspace opacity.  No pleural effusion.  No pneumothorax.
IMPRESSION: Cardiomegaly and low lung volumes.  No acute cardiopulmonary disease.

## 2006-11-06 ENCOUNTER — Emergency Department (HOSPITAL_COMMUNITY): Admission: EM | Admit: 2006-11-06 | Discharge: 2006-11-07 | Payer: Self-pay | Admitting: Emergency Medicine

## 2006-11-08 IMAGING — CT CT EXTREM LOW BILAT W/O CM
1 series · 14 of 32 positions shown, 18 images · IV contrast (agent unspecified)
Comparison: CT of the abdomen and pelvis 08/29/04.

CLINICAL DATA: Abdominal and pelvic pain.  Nausea and vomiting.  Bilateral thigh pain.  Patient with history of self-administered injections into the legs. 
 ABDOMEN CT WITHOUT CONTRAST:
TECHNIQUE: Multidetector CT imaging of the abdomen was performed following the standard protocol without IV contrast.  Oral contrast was given.  Intravenous contrast was not utilized secondary to the patient?s reported history of contrast allergy.
TECHNIQUE: Multidetector CT imaging of the pelvis was performed following the standard protocol without IV contrast.   Oral contrast was given.  Intravenous contrast was not utilized secondary to the patient?s reported history of contrast allergy.
TECHNIQUE: Multidetector CT imaging was performed according to the standard protocol.  No intravenous contrast was administered.  Oral contrast was given.  Intravenous contrast was not utilized secondary to the patient?s reported history of contrast allergy.  Multiplanar CT image reconstructions were also generated.

[Series [id number]: — · axial · 0.84mm/px · z∈[+932,+1252]mm · 14 of 72 slices shown, 18 images]
[im 5/72  soft-tissue]
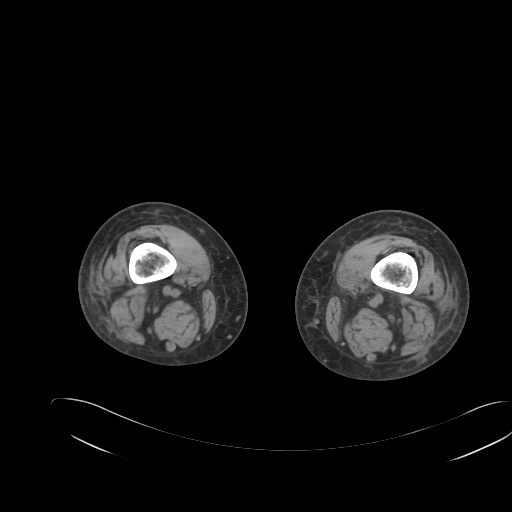
[im 5/72  bone]
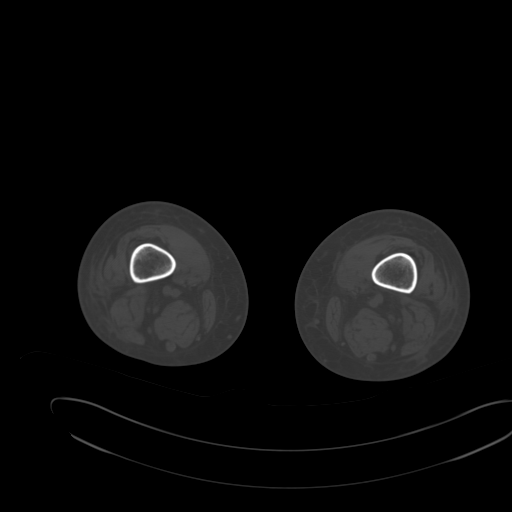
[im 10/72  soft-tissue]
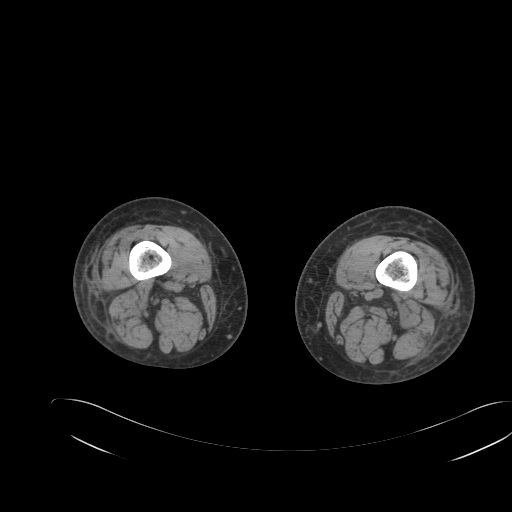
[im 17/72  soft-tissue]
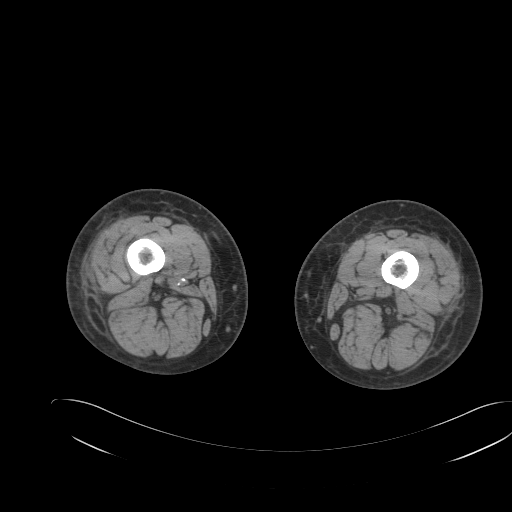
[im 21/72  soft-tissue]
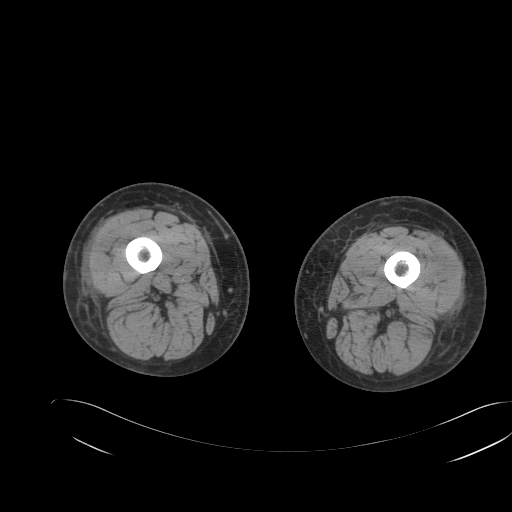
[im 28/72  soft-tissue]
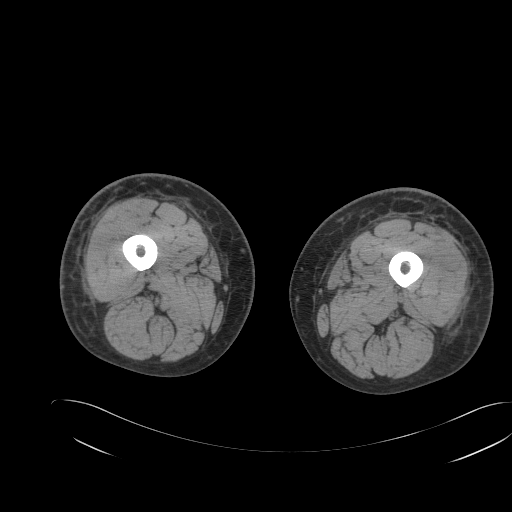
[im 33/72  soft-tissue]
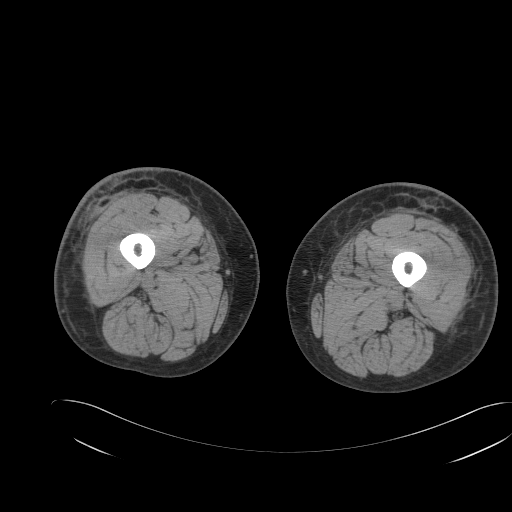
[im 39/72  soft-tissue]
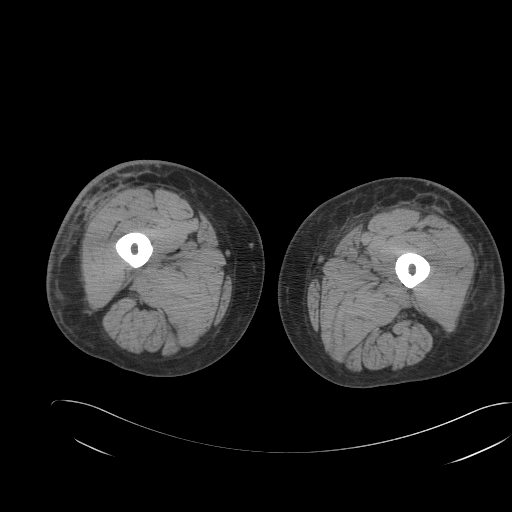
[im 44/72  soft-tissue]
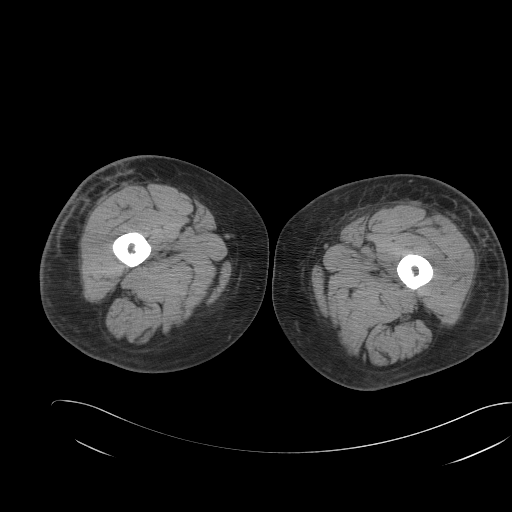
[im 51/72  soft-tissue]
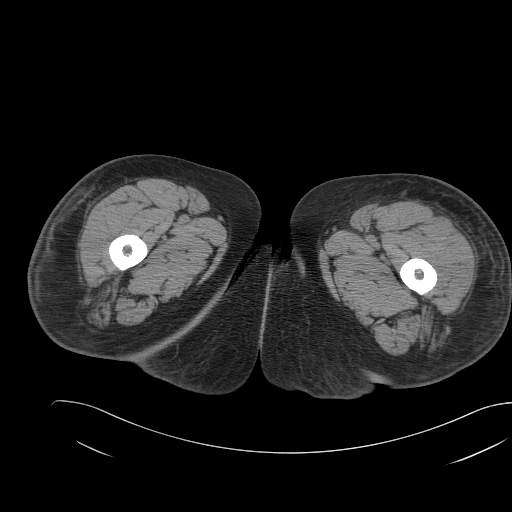
[im 51/72  bone]
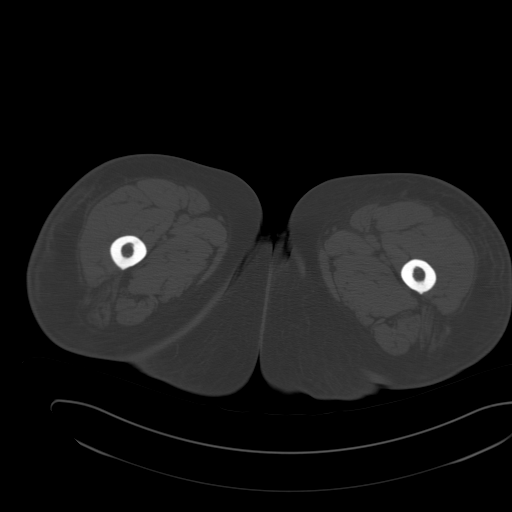
[im 55/72  soft-tissue]
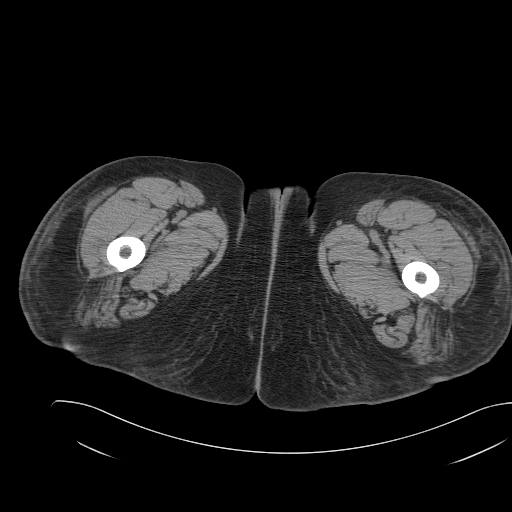
[im 62/72  soft-tissue]
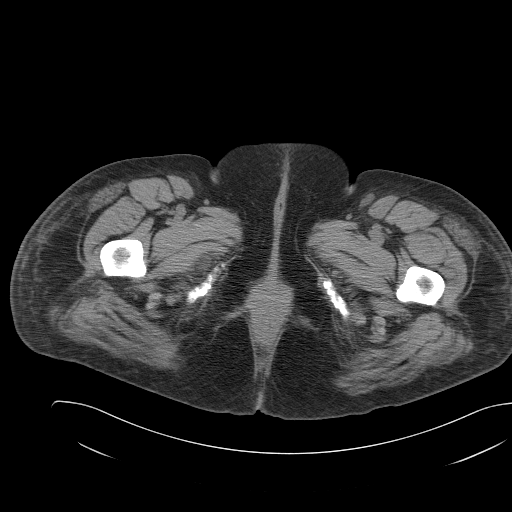
[im 62/72  lung]
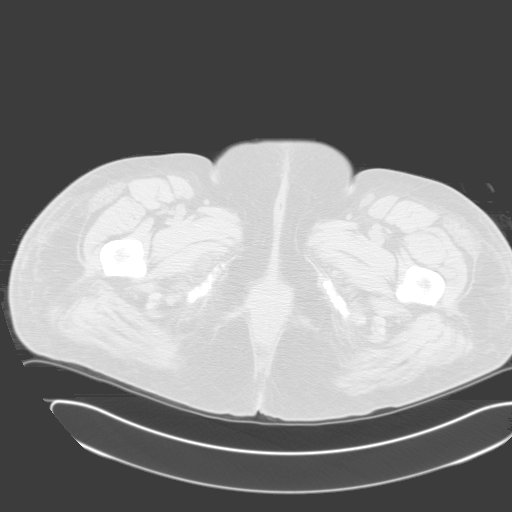
[im 65/72  lung]
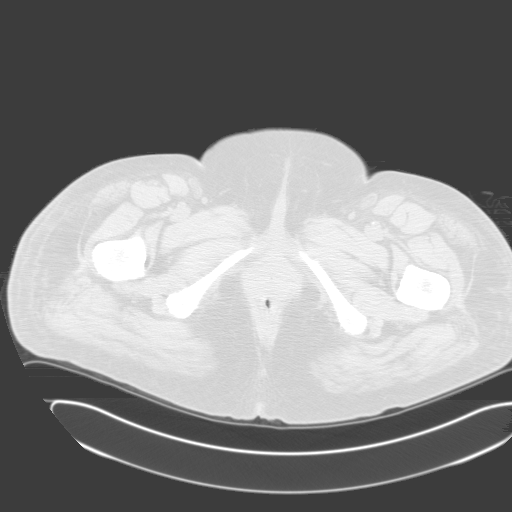
[im 67/72  soft-tissue]
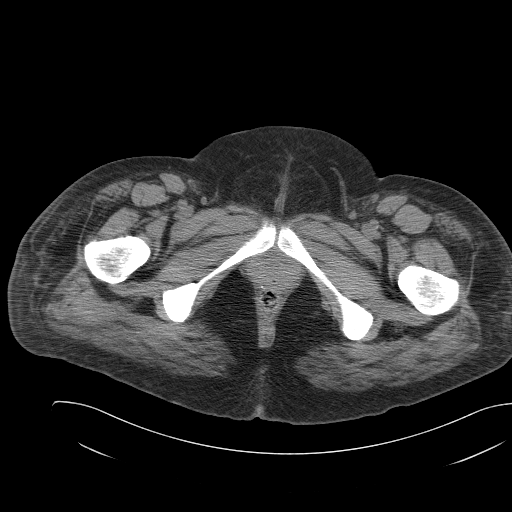
[im 67/72  lung]
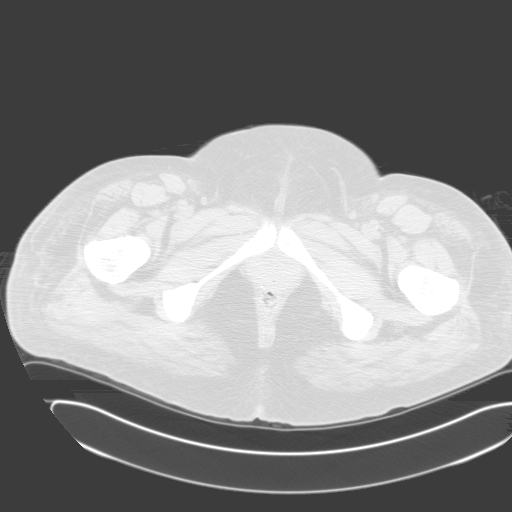
[im 69/72  lung]
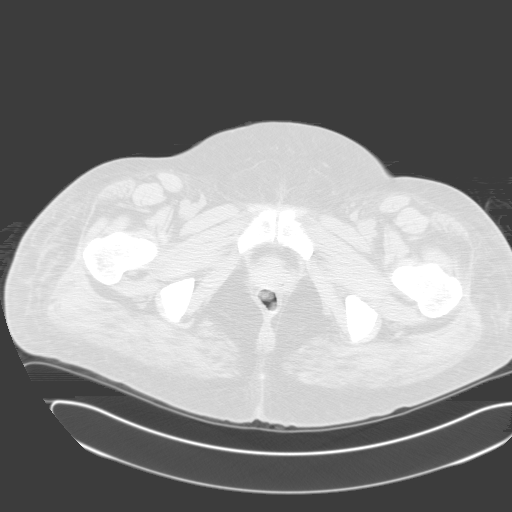

[14 of 32 positions shown; findings below may reference images not displayed]

FINDINGS: The patient is status post cholecystectomy.  The liver, spleen, pancreas, kidneys, and right adrenal gland are unremarkable.  A small stable left adrenal myelolipoma and several adrenal adenomas are unchanged.  No evidence of free fluid, enlarged lymph nodes, abdominal aortic aneurysm, or biliary dilatation.  The visualized bowel is unremarkable.  Please note that parenchymal abnormalities may be missed as intravenous contrast was not administered.
IMPRESSION: 1.  No acute intraabdominal abnormality. 
 2.  Tiny right pleural effusion. 
 3.  Stable left adrenal myelolipoma and adenomas.
 PELVIS CT WITHOUT CONTRAST:
FINDINGS: The bowel and bladder are unremarkable.  No evidence of free fluid or enlarged lymph nodes.  The appendix is unremarkable.  Evidence of prior lumbar surgery again noted.
IMPRESSION: No acute abnormality.
 CT OF THE UPPER LEGS WITHOUT CONTRAST:
FINDINGS: Helical CT imaging of the upper legs to just above the knees performed.  There is subcutaneous edema and mild anterior skin thickening overlying the mid thighs.  No focal fluid collections are identified.  Subcutaneous inflammation is slightly worse on the right.  No focal bony abnormalities are identified.
IMPRESSION: Bilateral cellulitis, right slightly greater than left.

## 2006-11-08 IMAGING — CT CT ABDOMEN W/O CM
1 of 2 series · 14 of 32 positions shown, 18 images · IV contrast (agent unspecified)
Comparison: CT of the abdomen and pelvis 08/29/04.

CLINICAL DATA: Abdominal and pelvic pain.  Nausea and vomiting.  Bilateral thigh pain.  Patient with history of self-administered injections into the legs. 
 ABDOMEN CT WITHOUT CONTRAST:
TECHNIQUE: Multidetector CT imaging of the abdomen was performed following the standard protocol without IV contrast.  Oral contrast was given.  Intravenous contrast was not utilized secondary to the patient?s reported history of contrast allergy.
TECHNIQUE: Multidetector CT imaging of the pelvis was performed following the standard protocol without IV contrast.   Oral contrast was given.  Intravenous contrast was not utilized secondary to the patient?s reported history of contrast allergy.
TECHNIQUE: Multidetector CT imaging was performed according to the standard protocol.  No intravenous contrast was administered.  Oral contrast was given.  Intravenous contrast was not utilized secondary to the patient?s reported history of contrast allergy.  Multiplanar CT image reconstructions were also generated.

[Series 6801: — · axial · 0.69mm/px · z∈[+1228,+1718]mm · 14 of 108 slices shown, 18 images]
[im 5/108  soft-tissue]
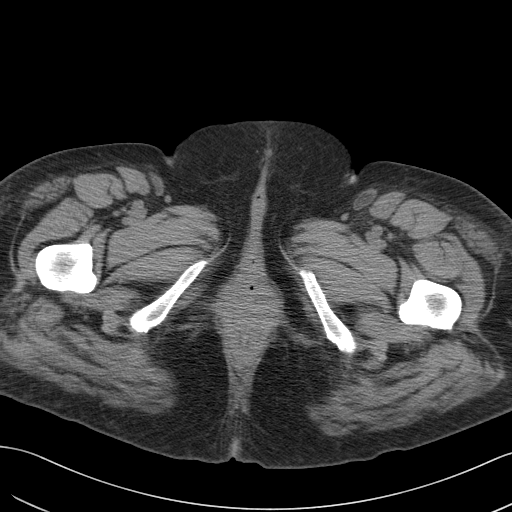
[im 5/108  bone]
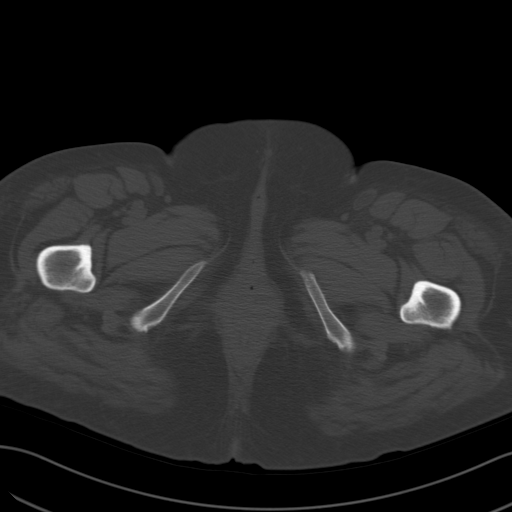
[im 14/108  soft-tissue]
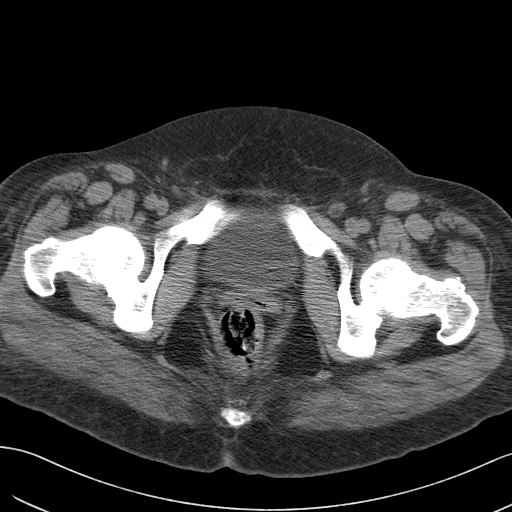
[im 24/108  soft-tissue]
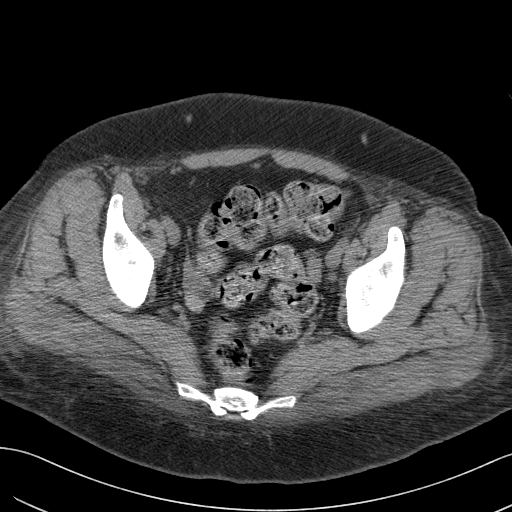
[im 33/108  soft-tissue]
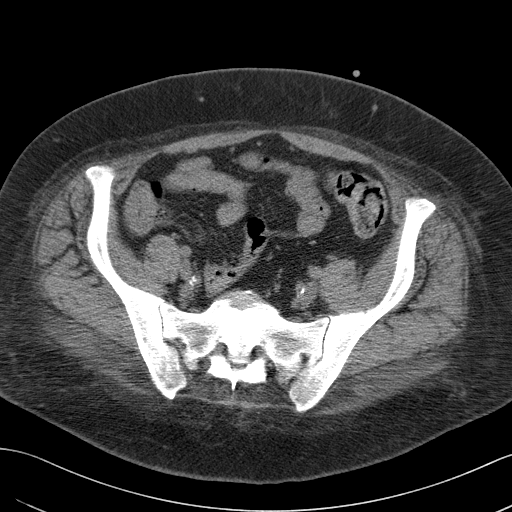
[im 42/108  soft-tissue]
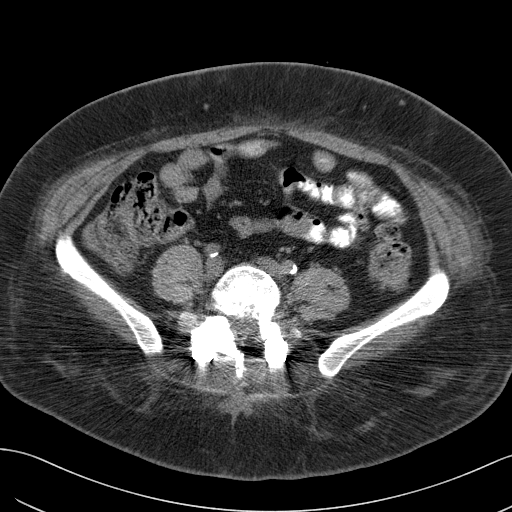
[im 52/108  soft-tissue]
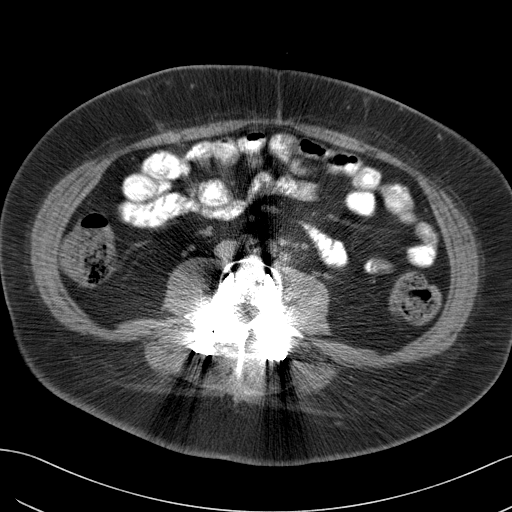
[im 56/108  soft-tissue]
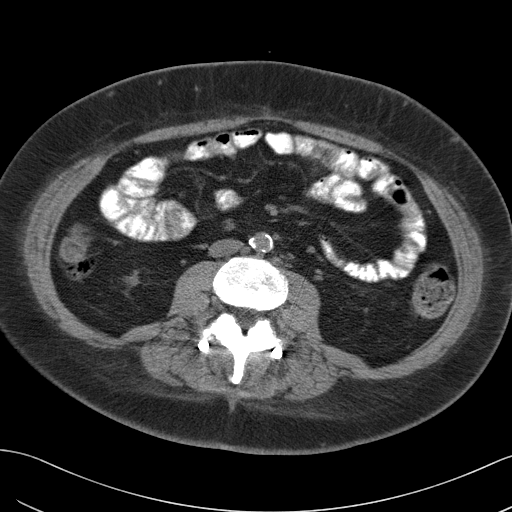
[im 66/108  soft-tissue]
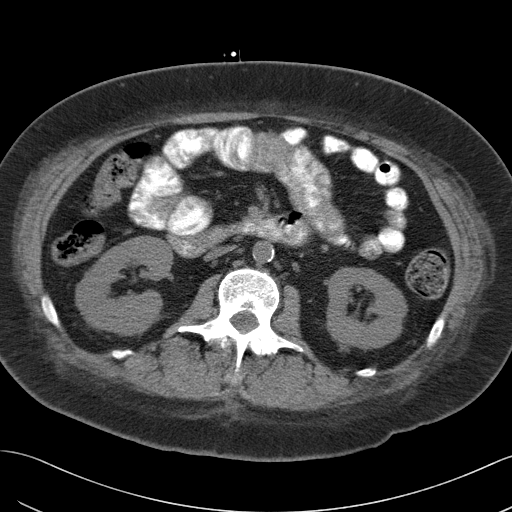
[im 75/108  soft-tissue]
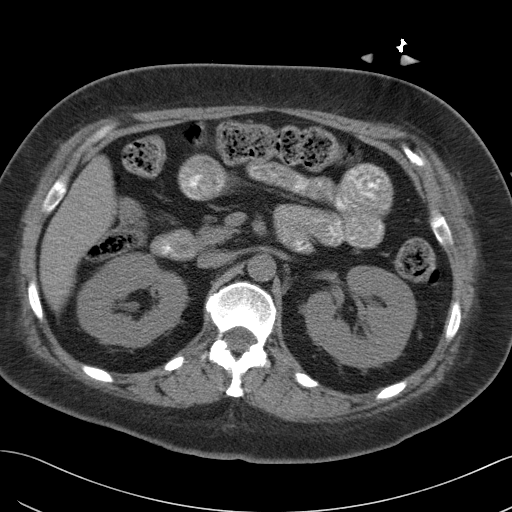
[im 75/108  bone]
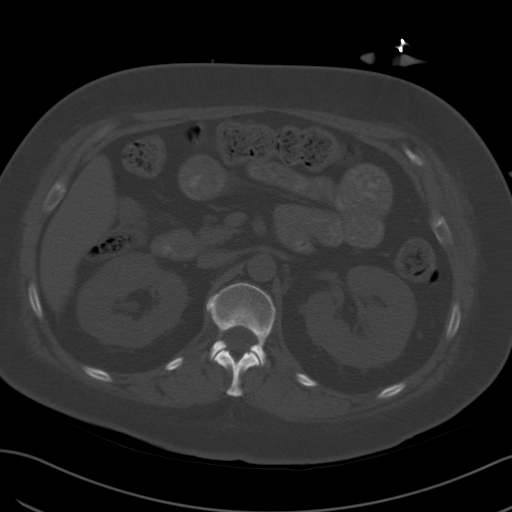
[im 84/108  soft-tissue]
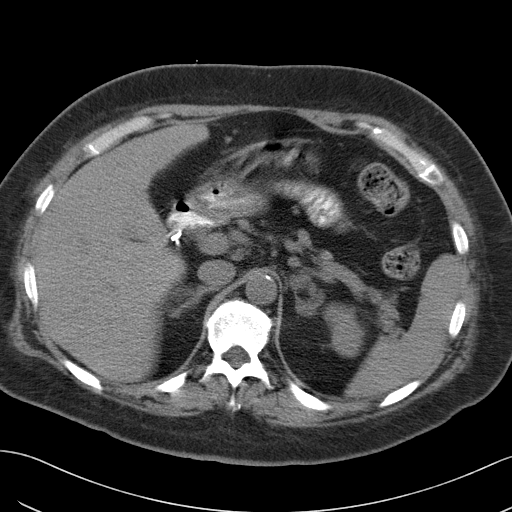
[im 89/108  lung]
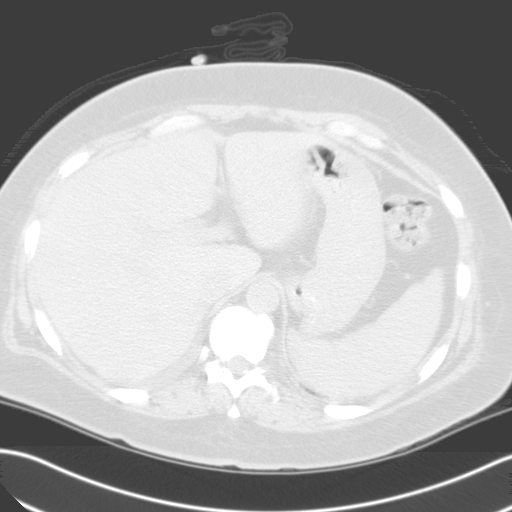
[im 94/108  soft-tissue]
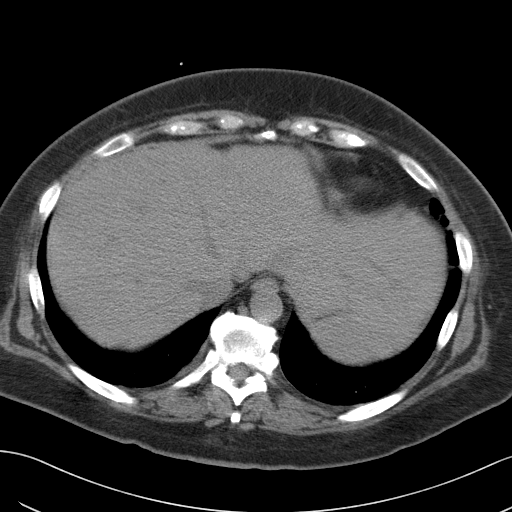
[im 94/108  lung]
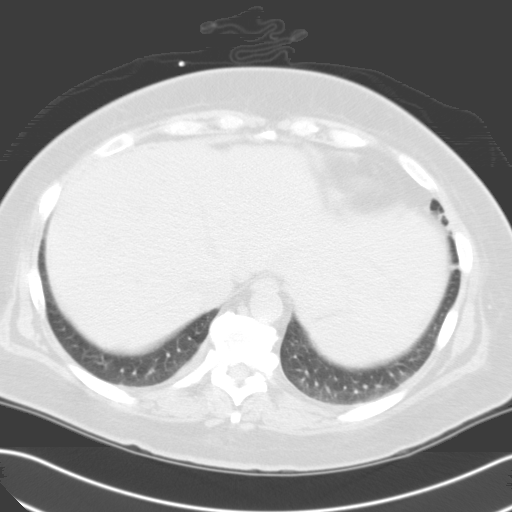
[im 98/108  lung]
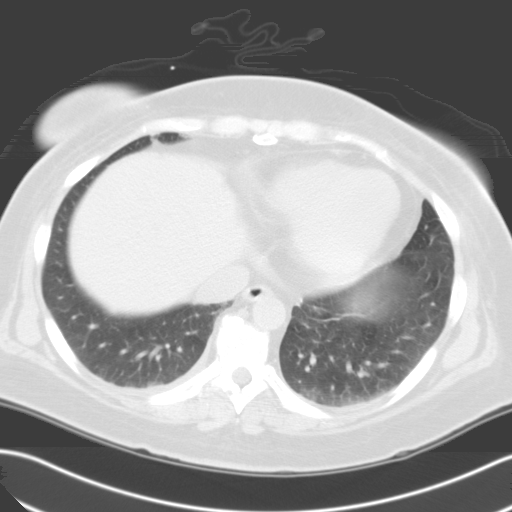
[im 103/108  soft-tissue]
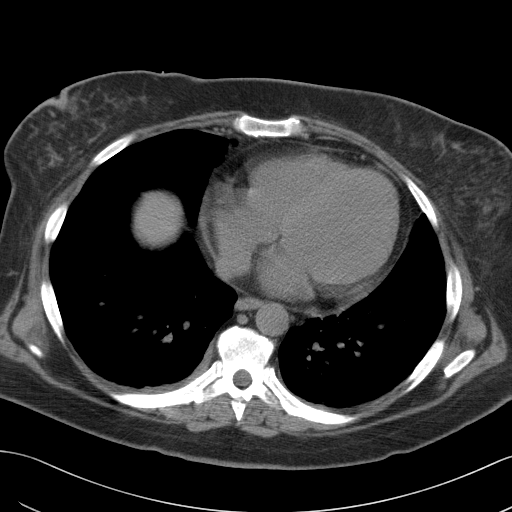
[im 103/108  lung]
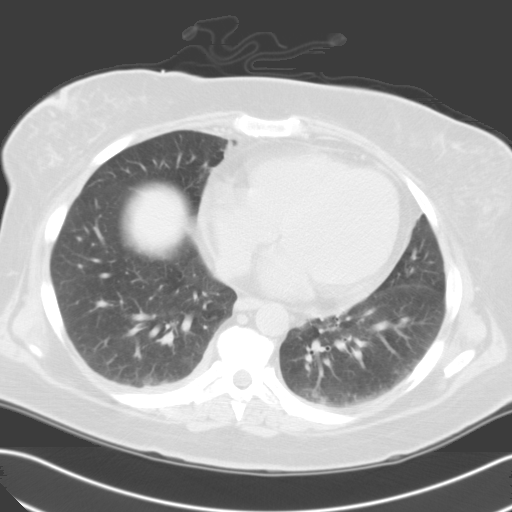

[14 of 32 positions shown; findings below may reference images not displayed]

FINDINGS: The patient is status post cholecystectomy.  The liver, spleen, pancreas, kidneys, and right adrenal gland are unremarkable.  A small stable left adrenal myelolipoma and several adrenal adenomas are unchanged.  No evidence of free fluid, enlarged lymph nodes, abdominal aortic aneurysm, or biliary dilatation.  The visualized bowel is unremarkable.  Please note that parenchymal abnormalities may be missed as intravenous contrast was not administered.
IMPRESSION: 1.  No acute intraabdominal abnormality. 
 2.  Tiny right pleural effusion. 
 3.  Stable left adrenal myelolipoma and adenomas.
 PELVIS CT WITHOUT CONTRAST:
FINDINGS: The bowel and bladder are unremarkable.  No evidence of free fluid or enlarged lymph nodes.  The appendix is unremarkable.  Evidence of prior lumbar surgery again noted.
IMPRESSION: No acute abnormality.
 CT OF THE UPPER LEGS WITHOUT CONTRAST:
FINDINGS: Helical CT imaging of the upper legs to just above the knees performed.  There is subcutaneous edema and mild anterior skin thickening overlying the mid thighs.  No focal fluid collections are identified.  Subcutaneous inflammation is slightly worse on the right.  No focal bony abnormalities are identified.
IMPRESSION: Bilateral cellulitis, right slightly greater than left.

## 2006-11-20 ENCOUNTER — Ambulatory Visit (HOSPITAL_COMMUNITY): Payer: Self-pay | Admitting: Family Medicine

## 2006-11-20 ENCOUNTER — Encounter (HOSPITAL_COMMUNITY): Admission: RE | Admit: 2006-11-20 | Discharge: 2006-12-20 | Payer: Self-pay | Admitting: Family Medicine

## 2006-11-23 IMAGING — CR DG CHEST 1V PORT
1 series · 1 of 1 positions shown · non-contrast
Comparison: 10/28/05.

CLINICAL DATA: Chest pain.  
PORTABLE CHEST - 1 VIEW, 1616 HOURS:

[view not recorded]
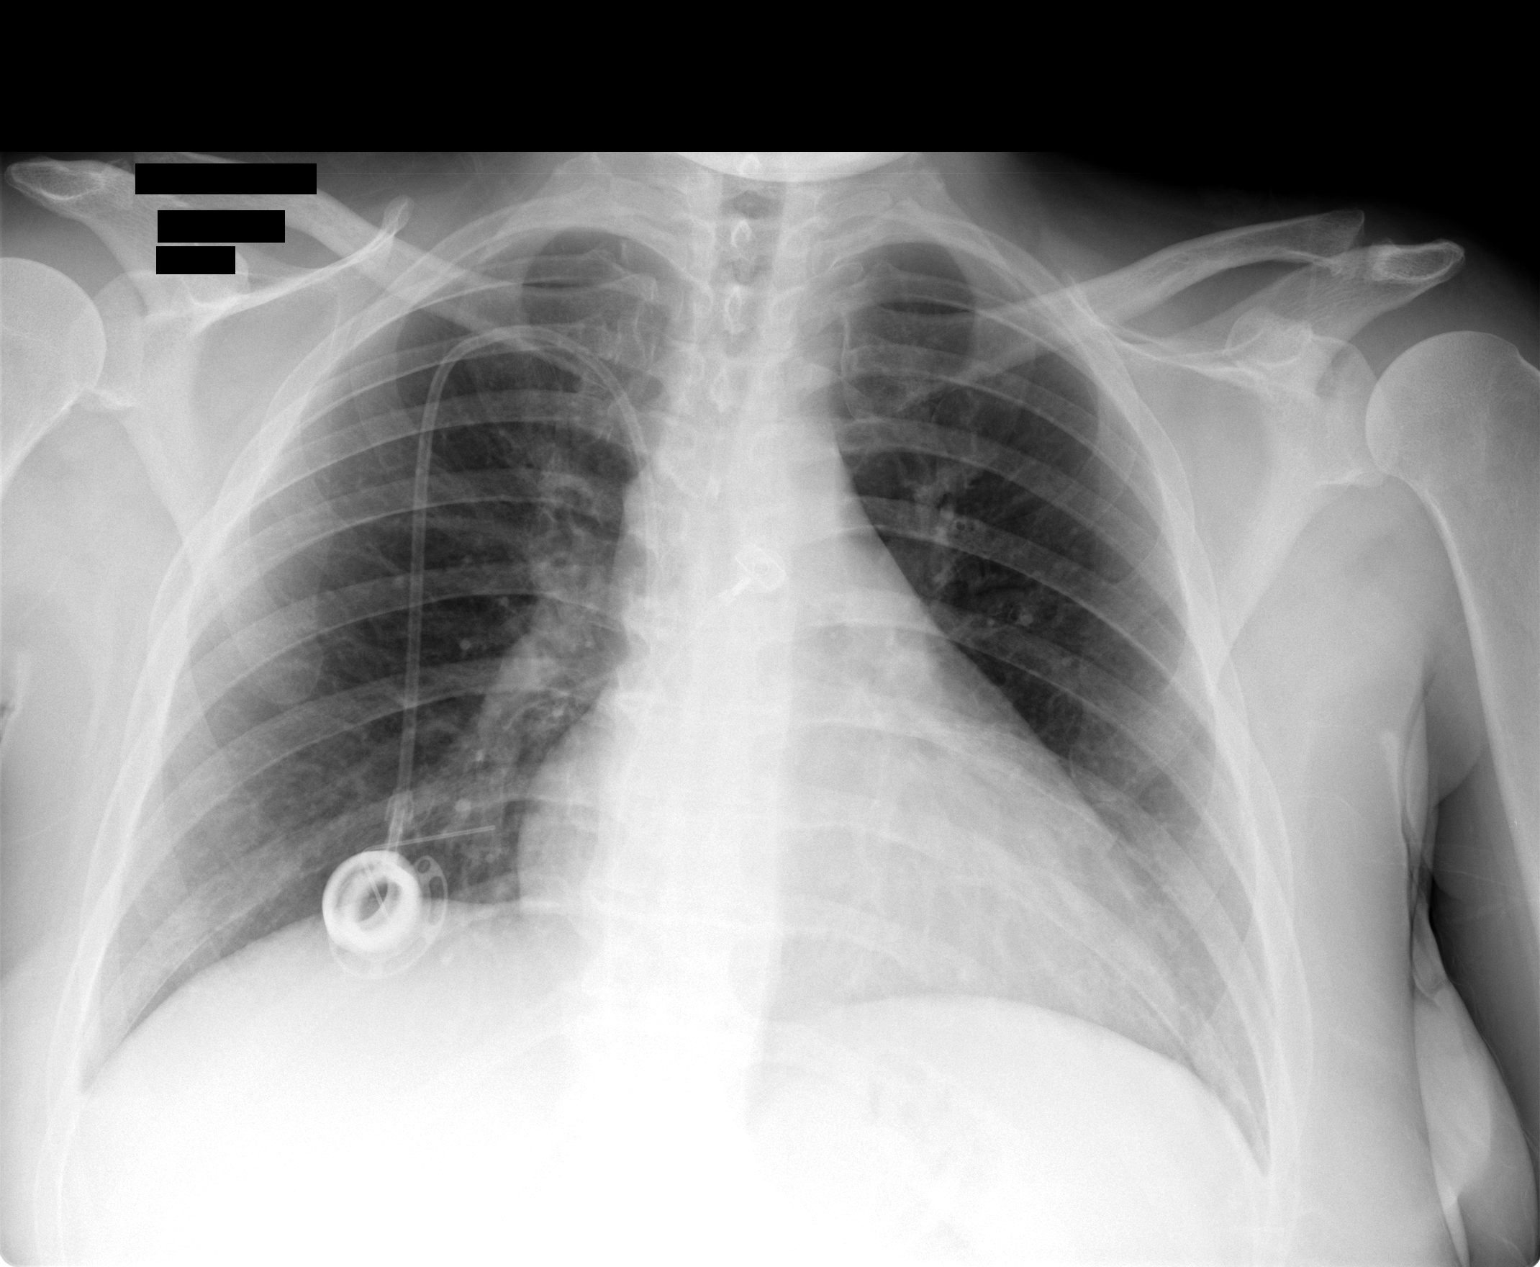

[1 of 1 positions shown; findings below may reference images not displayed]

FINDINGS: Portavenous catheter tip is in the SVC.  Cardiomediastinal silhouette appears unremarkable.  No CHF or acute lung process.  Pulmonary vessels appear slightly prominent in caliber, however, no frank vascular congestion.
IMPRESSION: No acute chest findings.

## 2006-12-03 ENCOUNTER — Inpatient Hospital Stay (HOSPITAL_COMMUNITY): Admission: EM | Admit: 2006-12-03 | Discharge: 2006-12-06 | Payer: Self-pay | Admitting: Emergency Medicine

## 2006-12-04 ENCOUNTER — Ambulatory Visit: Payer: Self-pay | Admitting: Gastroenterology

## 2006-12-06 ENCOUNTER — Ambulatory Visit: Payer: Self-pay | Admitting: Gastroenterology

## 2006-12-19 ENCOUNTER — Ambulatory Visit (HOSPITAL_COMMUNITY): Payer: Self-pay | Admitting: Family Medicine

## 2006-12-20 ENCOUNTER — Inpatient Hospital Stay (HOSPITAL_COMMUNITY): Admission: EM | Admit: 2006-12-20 | Discharge: 2006-12-24 | Payer: Self-pay | Admitting: Emergency Medicine
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
## 2007-01-23 ENCOUNTER — Inpatient Hospital Stay (HOSPITAL_COMMUNITY): Admission: AD | Admit: 2007-01-23 | Discharge: 2007-01-27 | Payer: Self-pay | Admitting: Family Medicine

## 2007-01-29 ENCOUNTER — Ambulatory Visit: Payer: Self-pay | Admitting: Internal Medicine

## 2007-02-09 ENCOUNTER — Emergency Department (HOSPITAL_COMMUNITY): Admission: EM | Admit: 2007-02-09 | Discharge: 2007-02-09 | Payer: Self-pay | Admitting: Emergency Medicine

## 2007-03-19 IMAGING — CR DG CHEST 2V
2 series · 2 of 2 positions shown · non-contrast
Comparison: 11/25/05.

CLINICAL DATA: Chest pain and dyspnea.
 CHEST - 2 VIEW ? 03/21/06:

[view not recorded (1 of 2)]
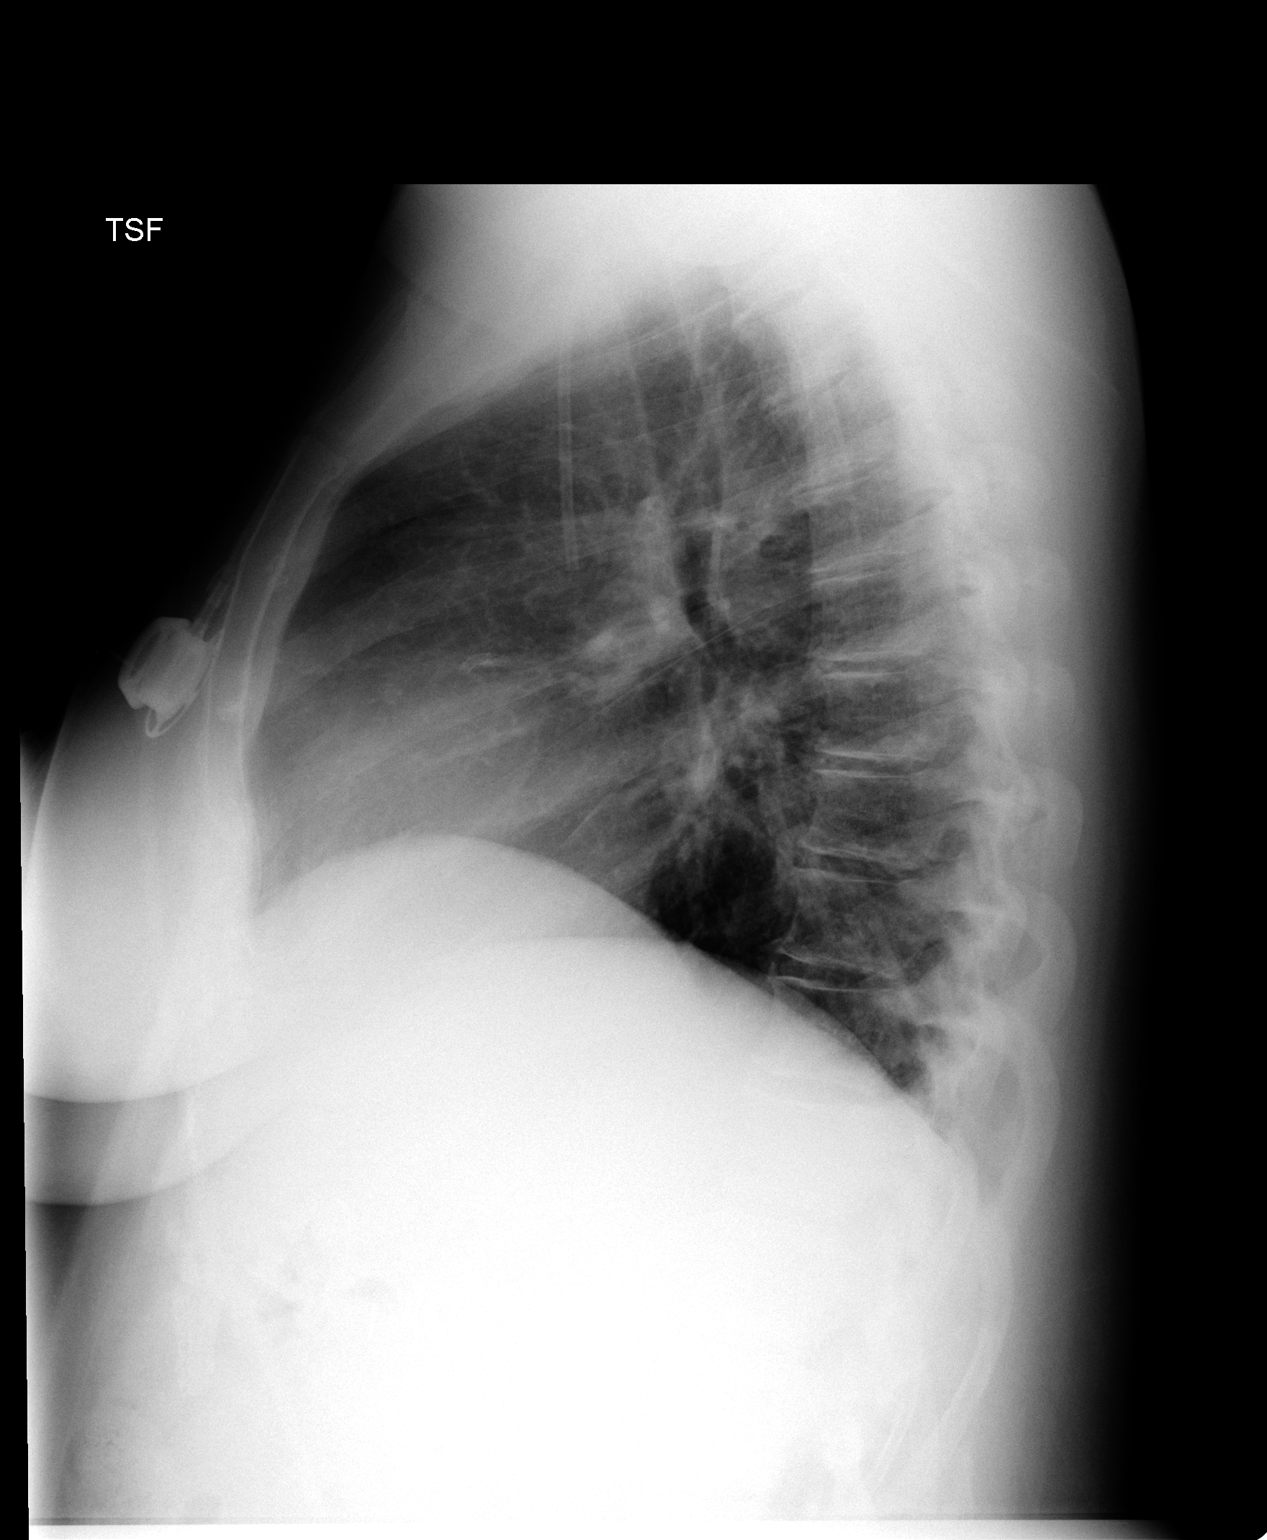

[view not recorded (2 of 2)]
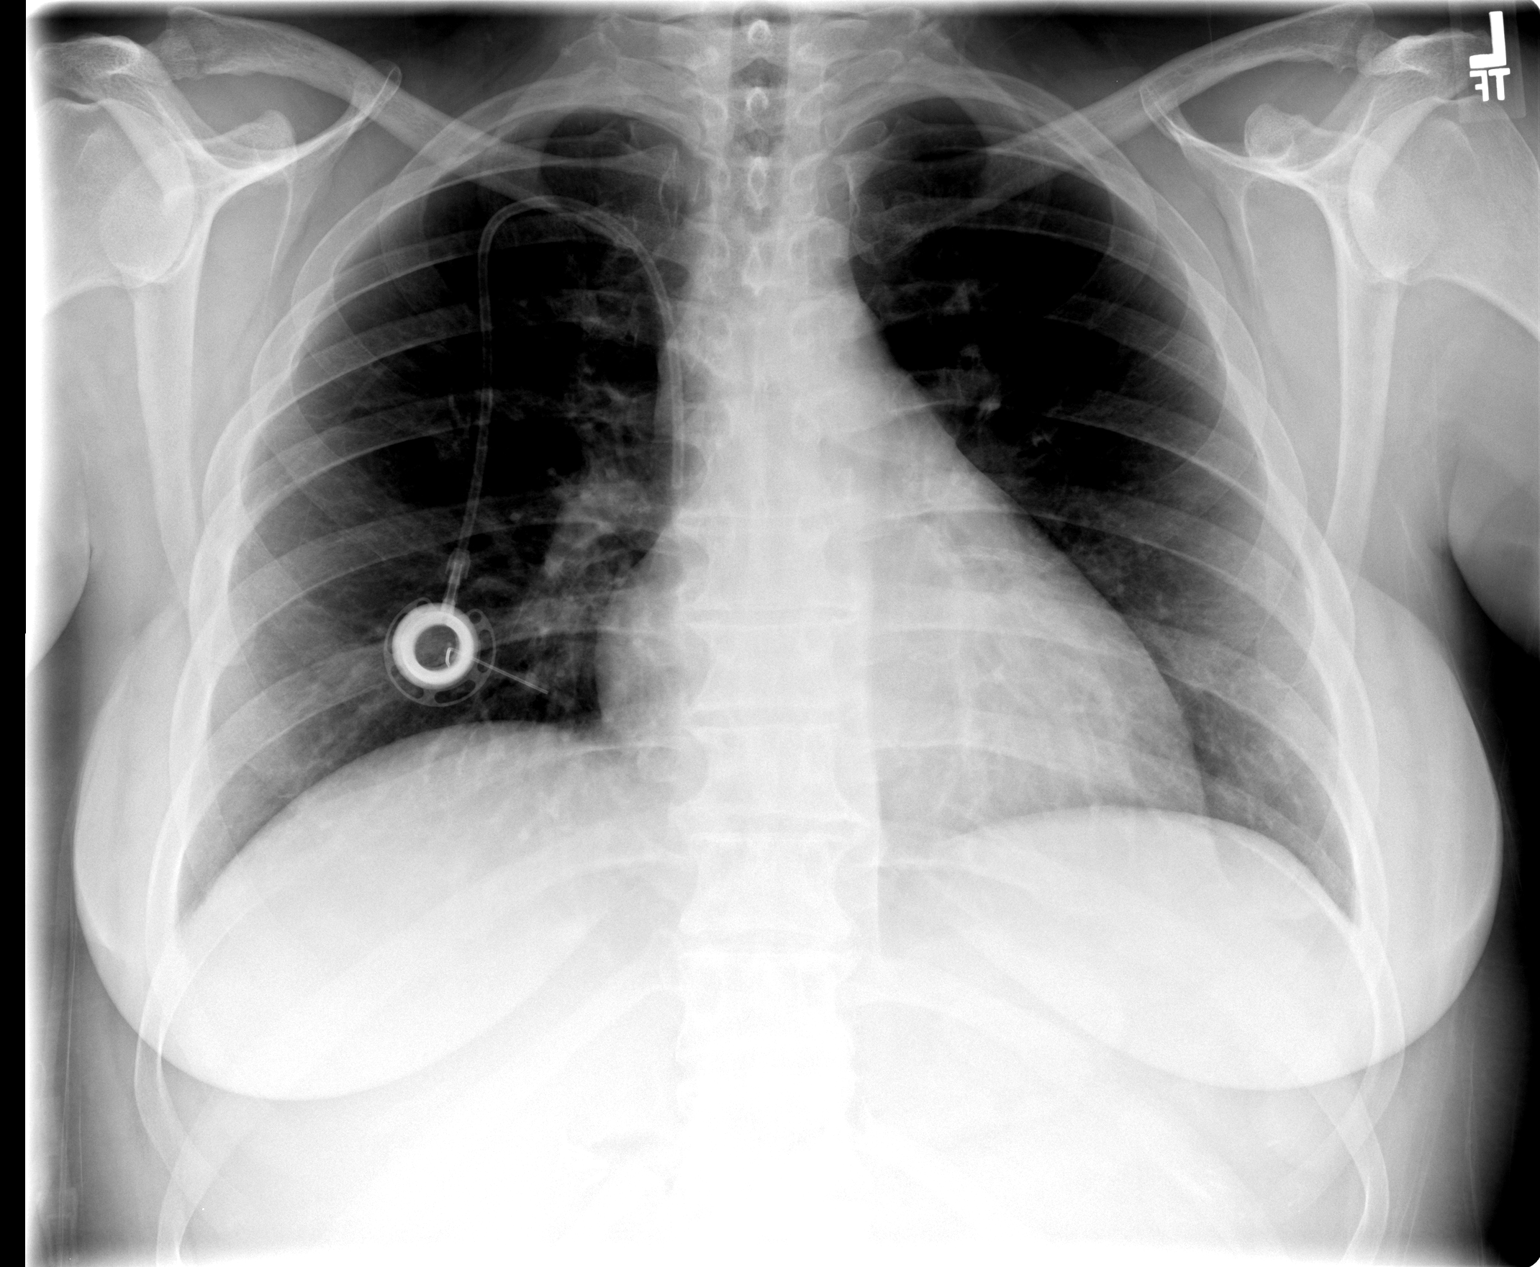

[2 of 2 positions shown; findings below may reference images not displayed]

FINDINGS: The right-sided port-a-catheter and mild peribronchial thickening are stable.  The cardiomediastinal silhouette is unremarkable.  No evidence of pleural effusions or pneumothorax are identified.  Thoracic spondylosis is unchanged.
IMPRESSION: No evidence of acute cardiopulmonary disease.

## 2007-04-17 ENCOUNTER — Encounter (HOSPITAL_COMMUNITY): Admission: RE | Admit: 2007-04-17 | Discharge: 2007-05-17 | Payer: Self-pay | Admitting: Family Medicine

## 2007-04-17 IMAGING — US US EXTREM LOW VENOUS BILAT
1 series · 13 of 24 positions shown · non-contrast
Comparison: None.

CLINICAL DATA: Vomiting.  Diabetic.
ABDOMEN ULTRASOUND:
TECHNIQUE: Complete abdominal ultrasound examination was performed including evaluation of the liver, gallbladder, bile ducts, pancreas, kidneys, spleen, IVC, and abdominal aorta.
CLINICAL DATA: Leg swelling.
BILATERAL LOWER EXTREMITY VENOUS DOPPLER ULTRASOUND:
TECHNIQUE: Gray-scale sonography with compression, as well as color and duplex Doppler ultrasound, were performed to evaluate the deep venous system from the level of the common femoral vein through the popliteal and proximal calf veins.

[Series 1: unknown · 0.35mm/px · 13 of 57 slices shown]
[im 1/57]
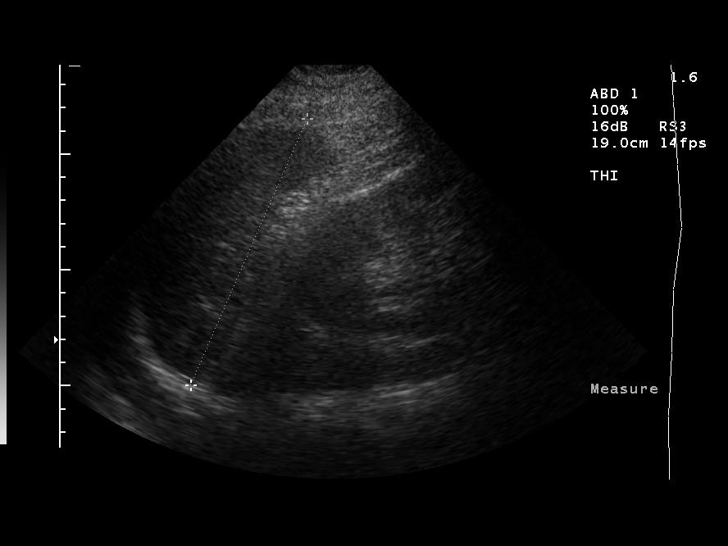
[im 5/57]
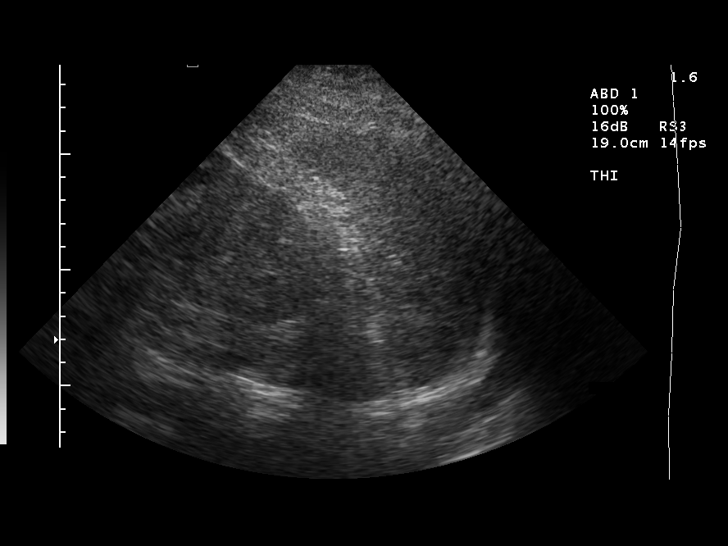
[im 10/57]
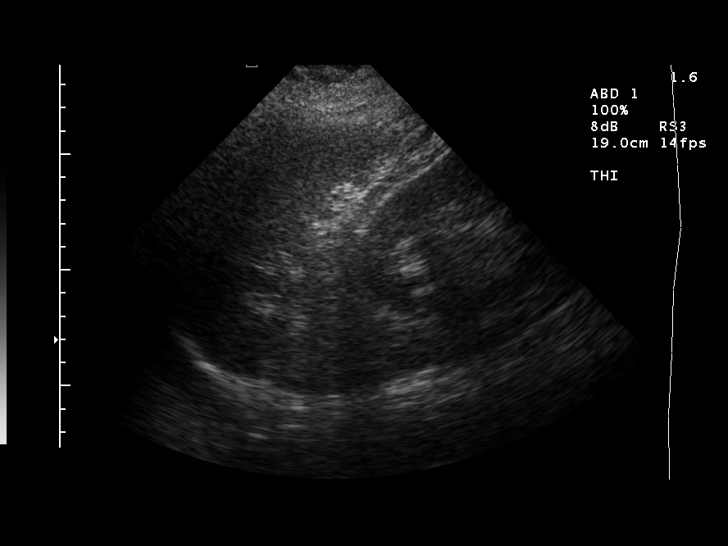
[im 15/57]
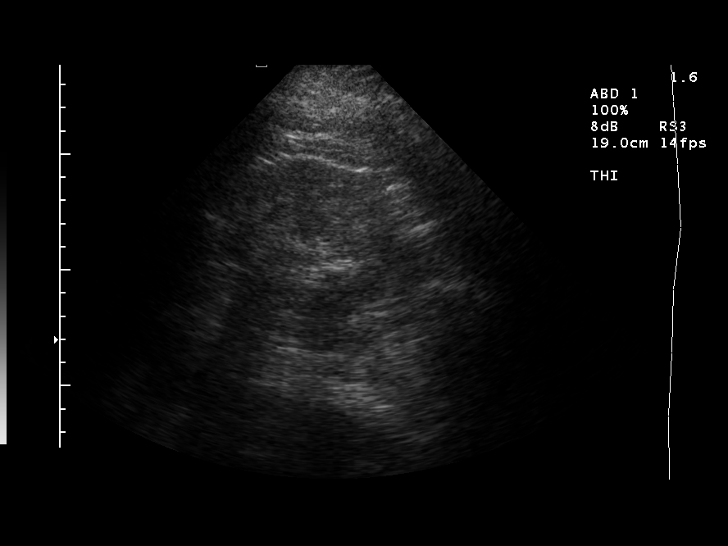
[im 20/57]
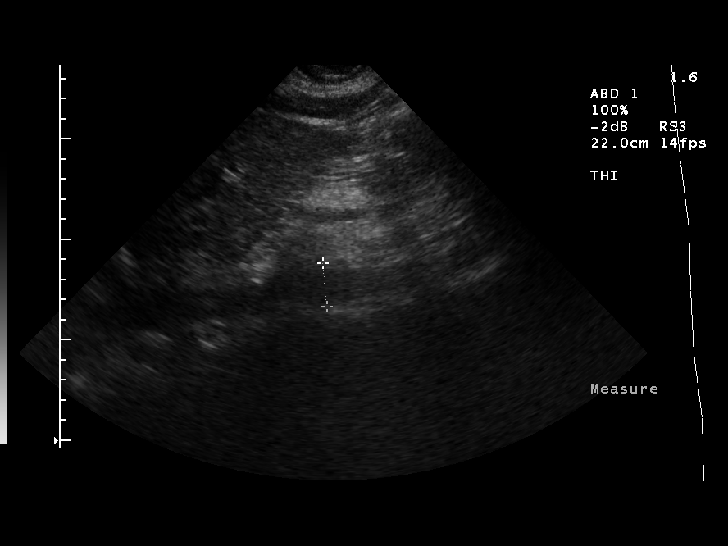
[im 25/57]
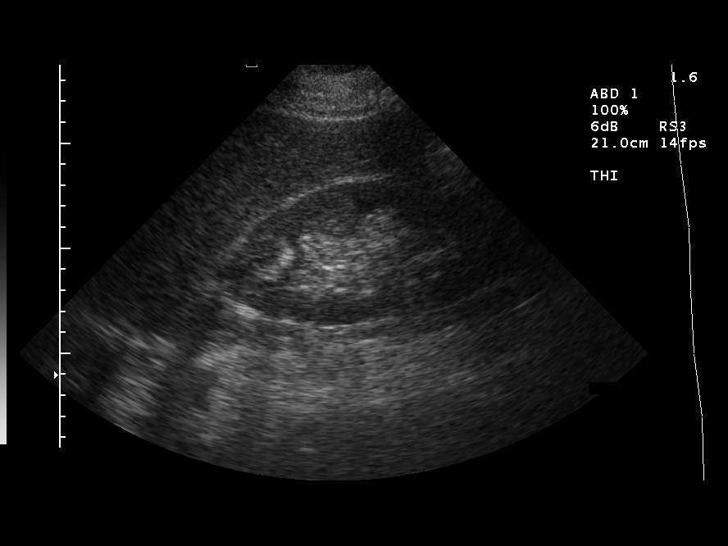
[im 30/57]
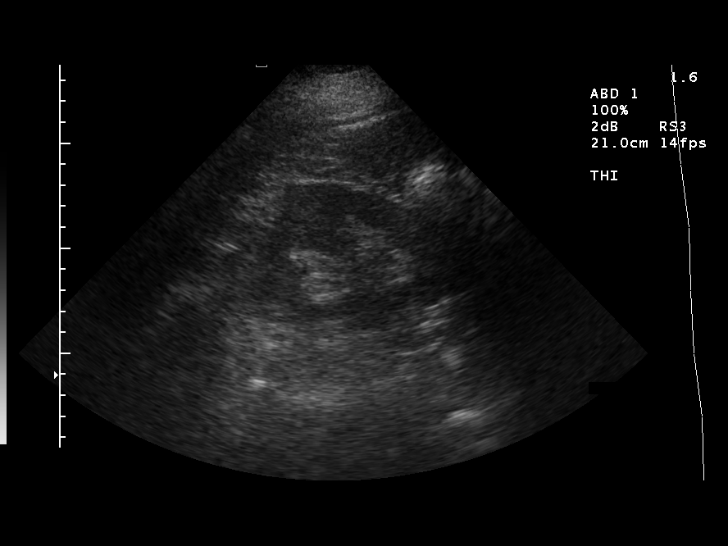
[im 32/57]
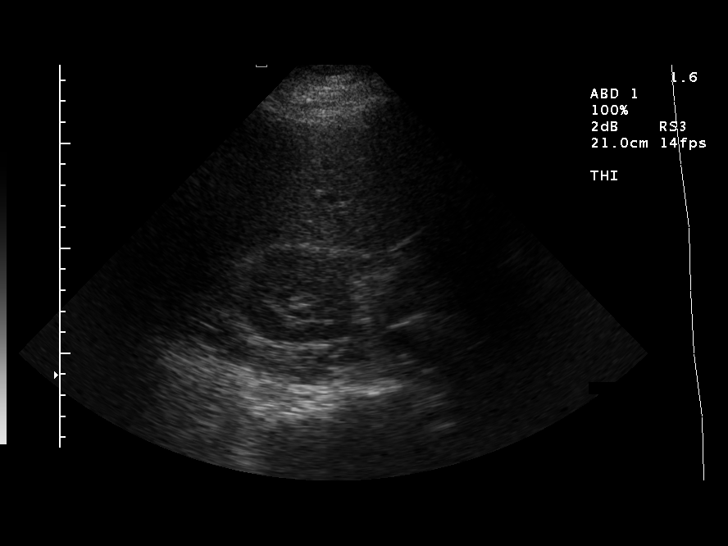
[im 37/57]
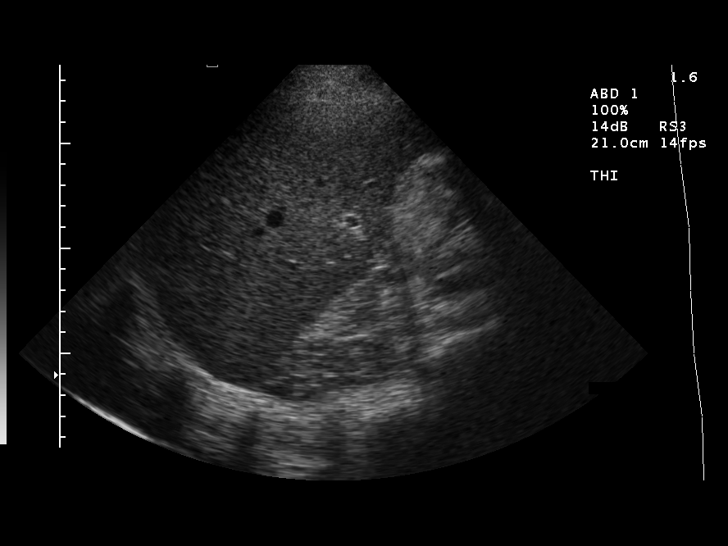
[im 42/57]
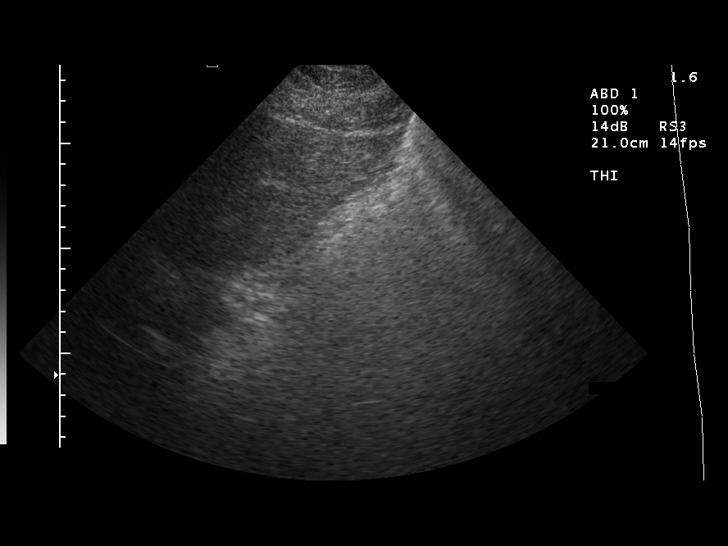
[im 47/57]
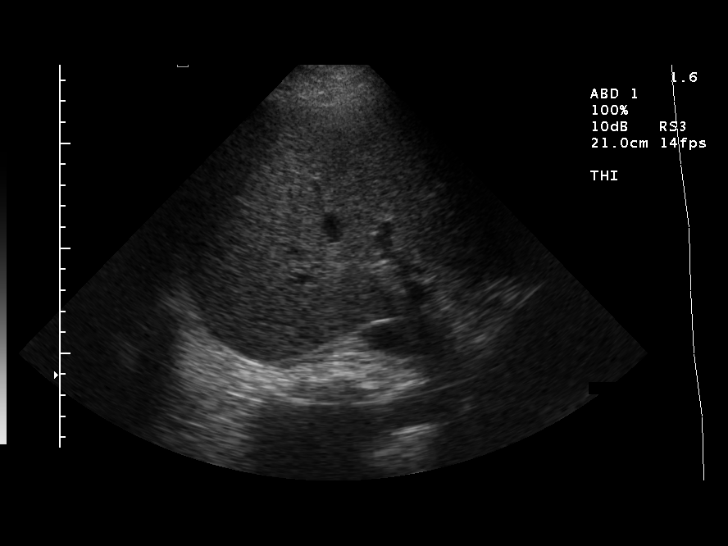
[im 52/57]
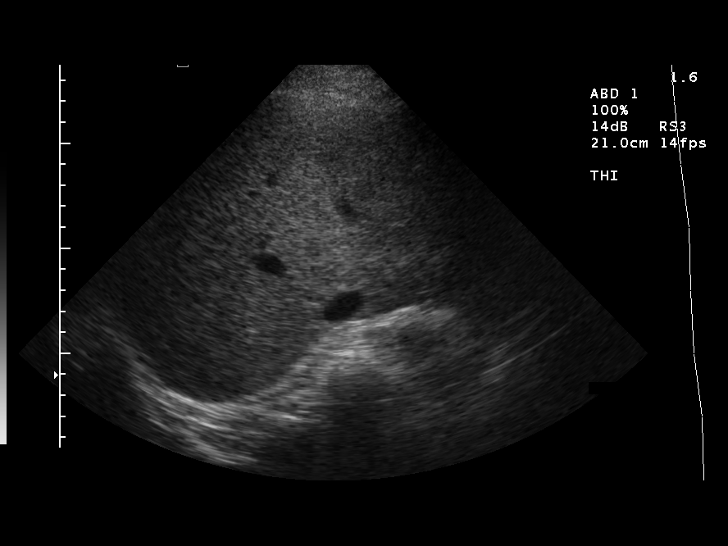
[im 57/57]
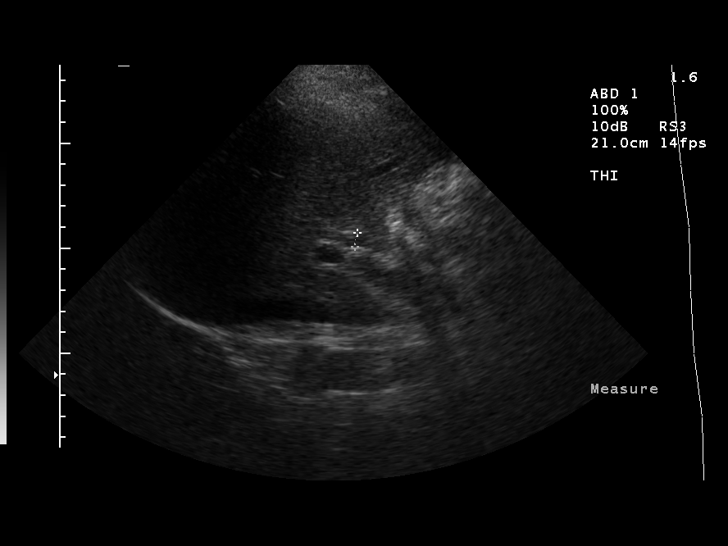

[13 of 24 positions shown; findings below may reference images not displayed]

FINDINGS: Status post cholecystectomy.  Fatty infiltration of the liver without focal hepatic lesion or intrahepatic biliary duct dilatation.  Common bile duct 6.7 mm.  The pancreas and distal aorta are not visualized secondary to overlying bowel gas.  The proximal and mid abdominal aorta do not appear to be aneurysmally dilated.  The CT detected adrenal mass was not evaluated on present exam.  
Right kidney 14.6 cm and left kidney 13.8 cm in length without evidence of hydronephrosis or focal renal mass.  Spleen unremarkable as is the inferior vena cava.
IMPRESSION: 1.  Fatty infiltration of the liver.  
2.  Limited evaluation of pancreas and distal abdominal aorta secondary to overlying bowel gas.
FINDINGS: Evaluation of the lower extremity from the level of the common femoral vein to the popliteal vein without evidence of deep venous thrombosis on either side.
IMPRESSION: No evidence of deep venous thrombosis on either side.

## 2007-04-20 ENCOUNTER — Emergency Department (HOSPITAL_COMMUNITY): Admission: EM | Admit: 2007-04-20 | Discharge: 2007-04-21 | Payer: Self-pay | Admitting: Emergency Medicine

## 2007-04-20 IMAGING — CT CT HEAD W/O CM
1 series · 16 of 30 positions shown, 20 images · IV contrast (agent unspecified)
Comparison: none

CLINICAL DATA: Headache and dizziness.
 HEAD CT WITHOUT CONTRAST:
TECHNIQUE: Contiguous axial images were obtained from the base of the skull through the vertex according to standard protocol without contrast.

[Series 1106: — · axial · 0.45mm/px · z∈[-640,-505]mm · 16 of 30 slices shown, 20 images]
[im 2/30  brain]
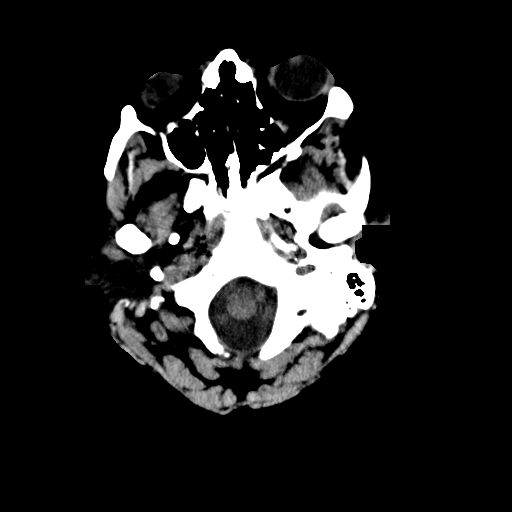
[im 2/30  bone]
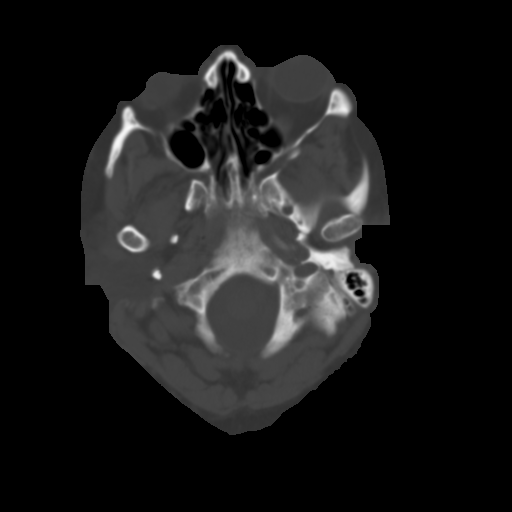
[im 4/30  brain]
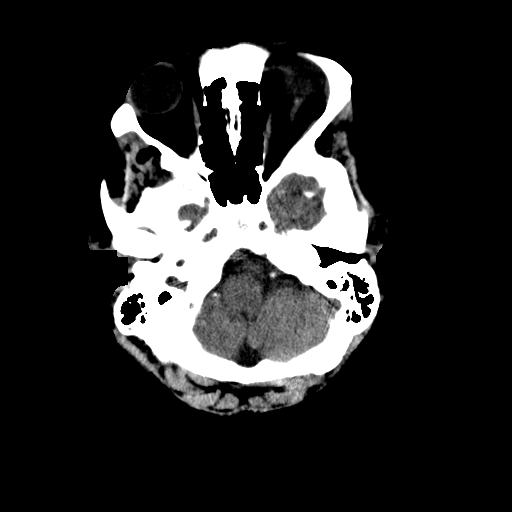
[im 6/30  brain]
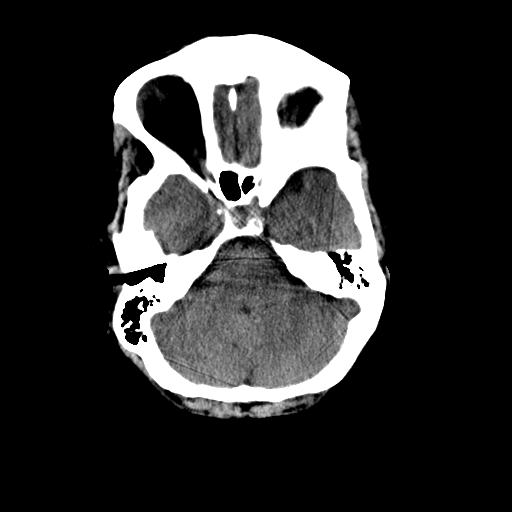
[im 8/30  brain]
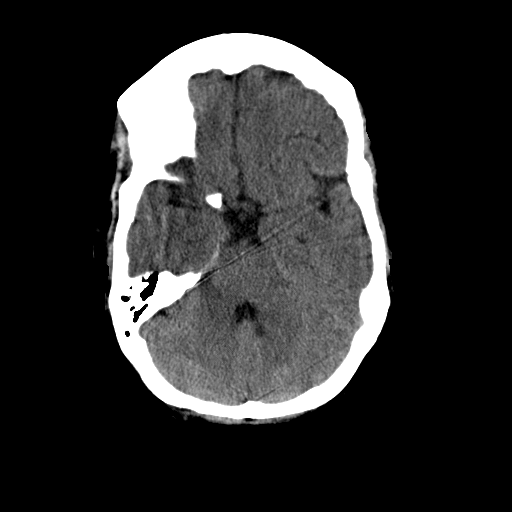
[im 9/30  brain]
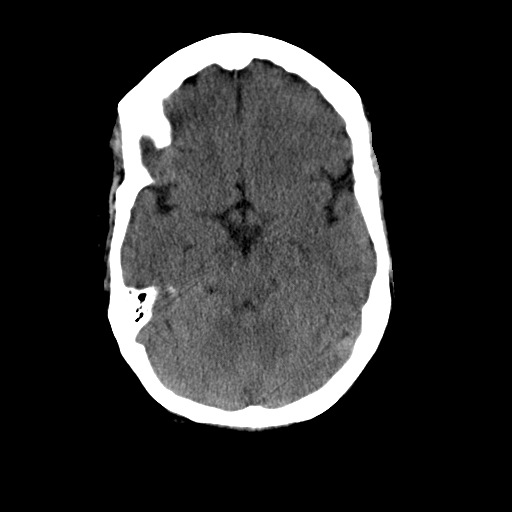
[im 9/30  bone]
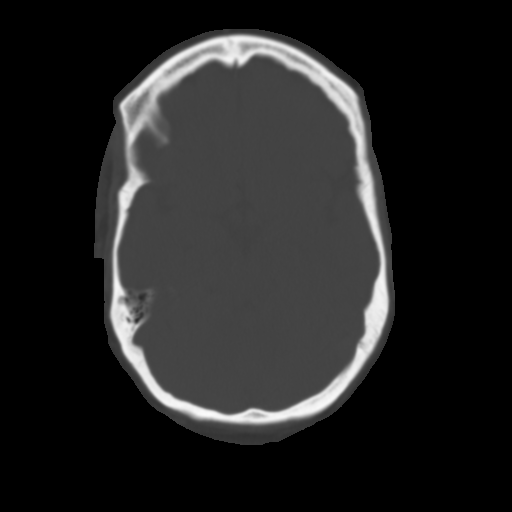
[im 11/30  brain]
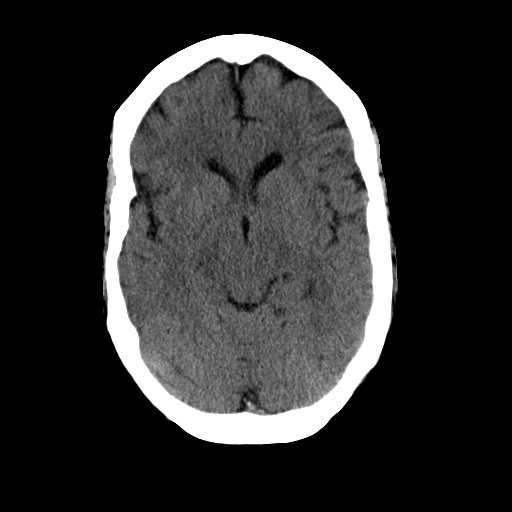
[im 13/30  brain]
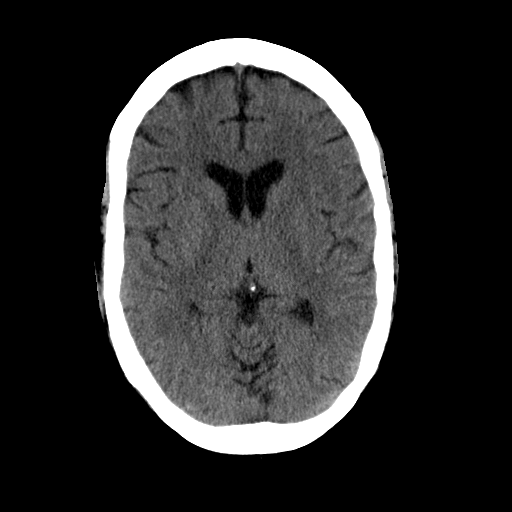
[im 15/30  brain]
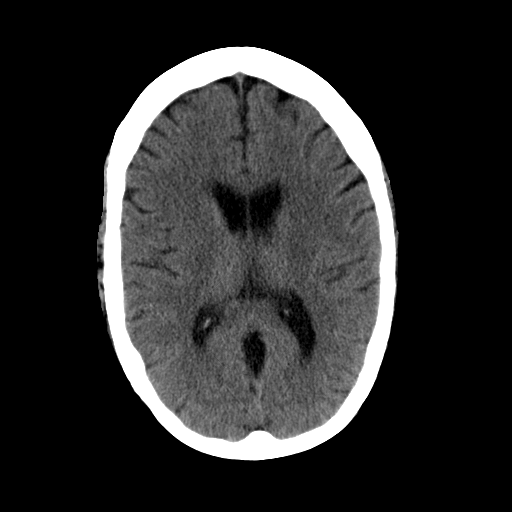
[im 16/30  brain]
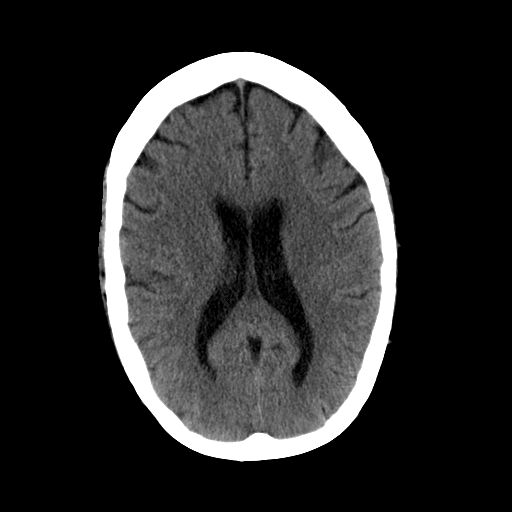
[im 16/30  bone]
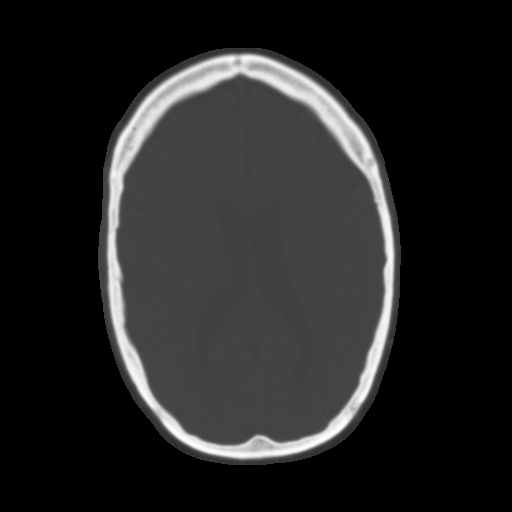
[im 18/30  brain]
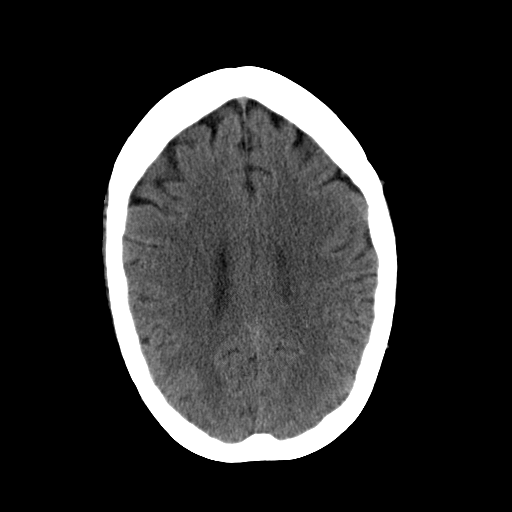
[im 20/30  brain]
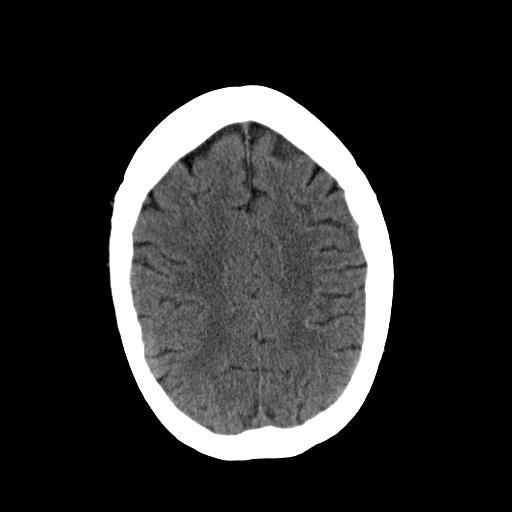
[im 22/30  brain]
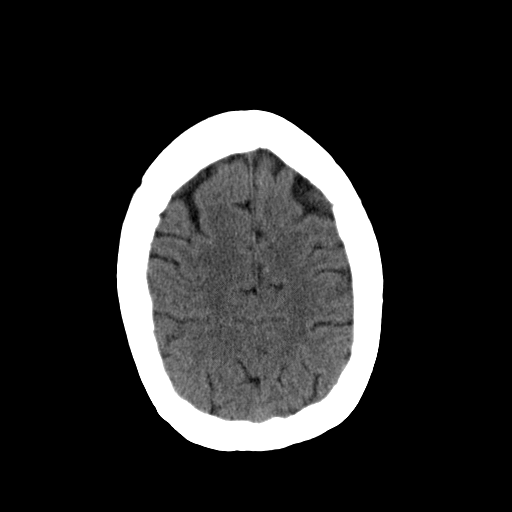
[im 23/30  brain]
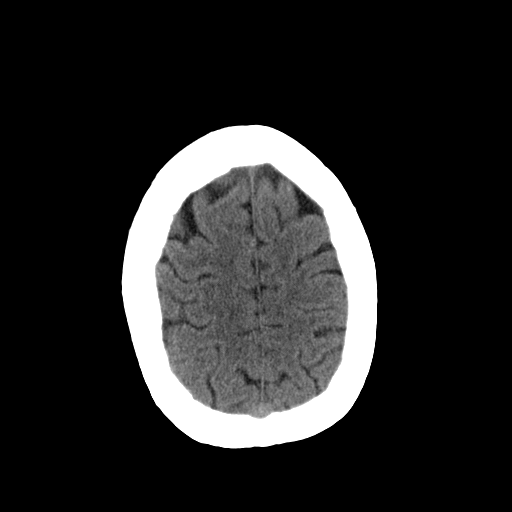
[im 23/30  bone]
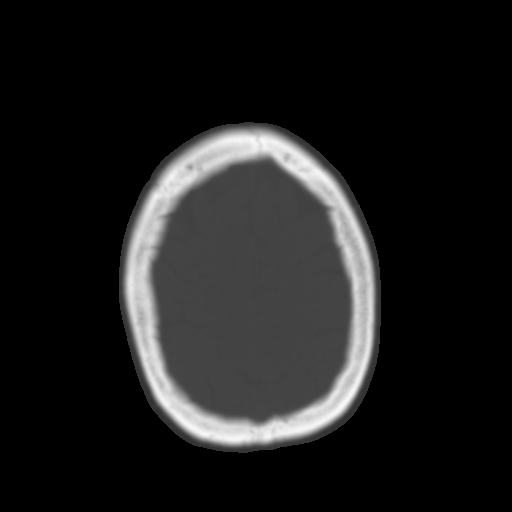
[im 25/30  brain]
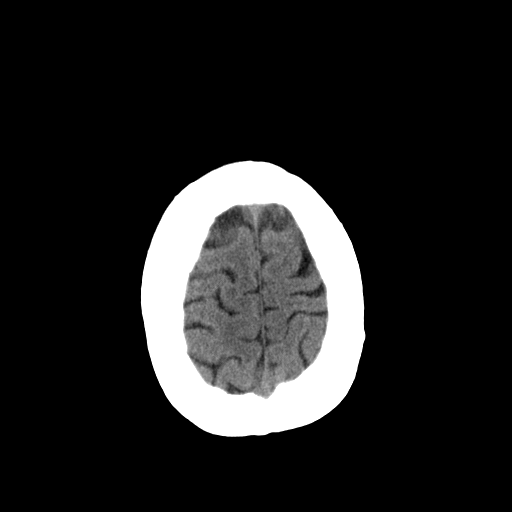
[im 27/30  brain]
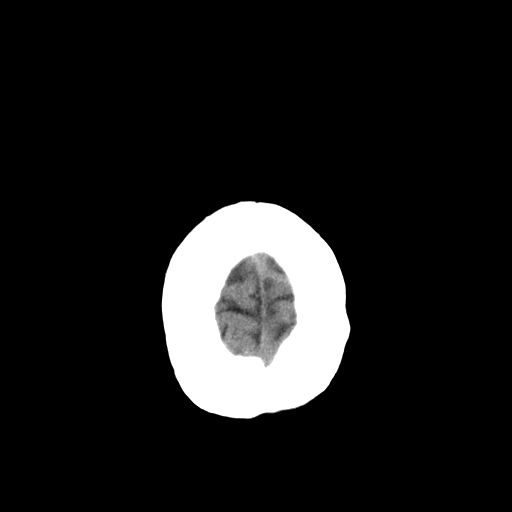
[im 29/30  brain]
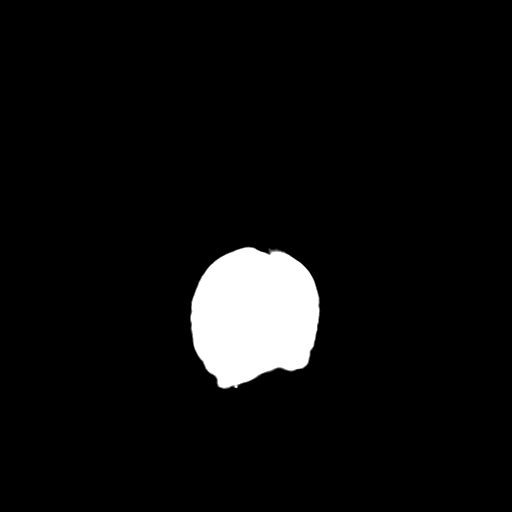

[16 of 30 positions shown; findings below may reference images not displayed]

FINDINGS: There is no evidence of intracranial hemorrhage, brain edema, acute 
 infarct, mass lesion, or mass effect.  No other intra-axial abnormalities 
 are seen, and the ventricles are within normal limits.  No abnormal 
 extra-axial fluid collections or masses are identified.  No skull 
 abnormalities are noted."
IMPRESSION: Negative non-contrast head CT.

## 2007-04-21 ENCOUNTER — Inpatient Hospital Stay (HOSPITAL_COMMUNITY): Admission: AD | Admit: 2007-04-21 | Discharge: 2007-04-23 | Payer: Self-pay | Admitting: Family Medicine

## 2007-04-30 ENCOUNTER — Ambulatory Visit: Payer: Self-pay | Admitting: Orthopedic Surgery

## 2007-05-03 ENCOUNTER — Emergency Department (HOSPITAL_COMMUNITY): Admission: EM | Admit: 2007-05-03 | Discharge: 2007-05-04 | Payer: Self-pay | Admitting: Emergency Medicine

## 2007-06-07 IMAGING — CR DG ABDOMEN ACUTE W/ 1V CHEST
3 series · 3 of 3 positions shown · non-contrast
Comparison: 05/20/05.

CLINICAL DATA: 35-year-old with abdominal pain and chest pain.
 ACUTE ABDOMINAL SERIES WITH CHEST:

[view not recorded (1 of 3)]
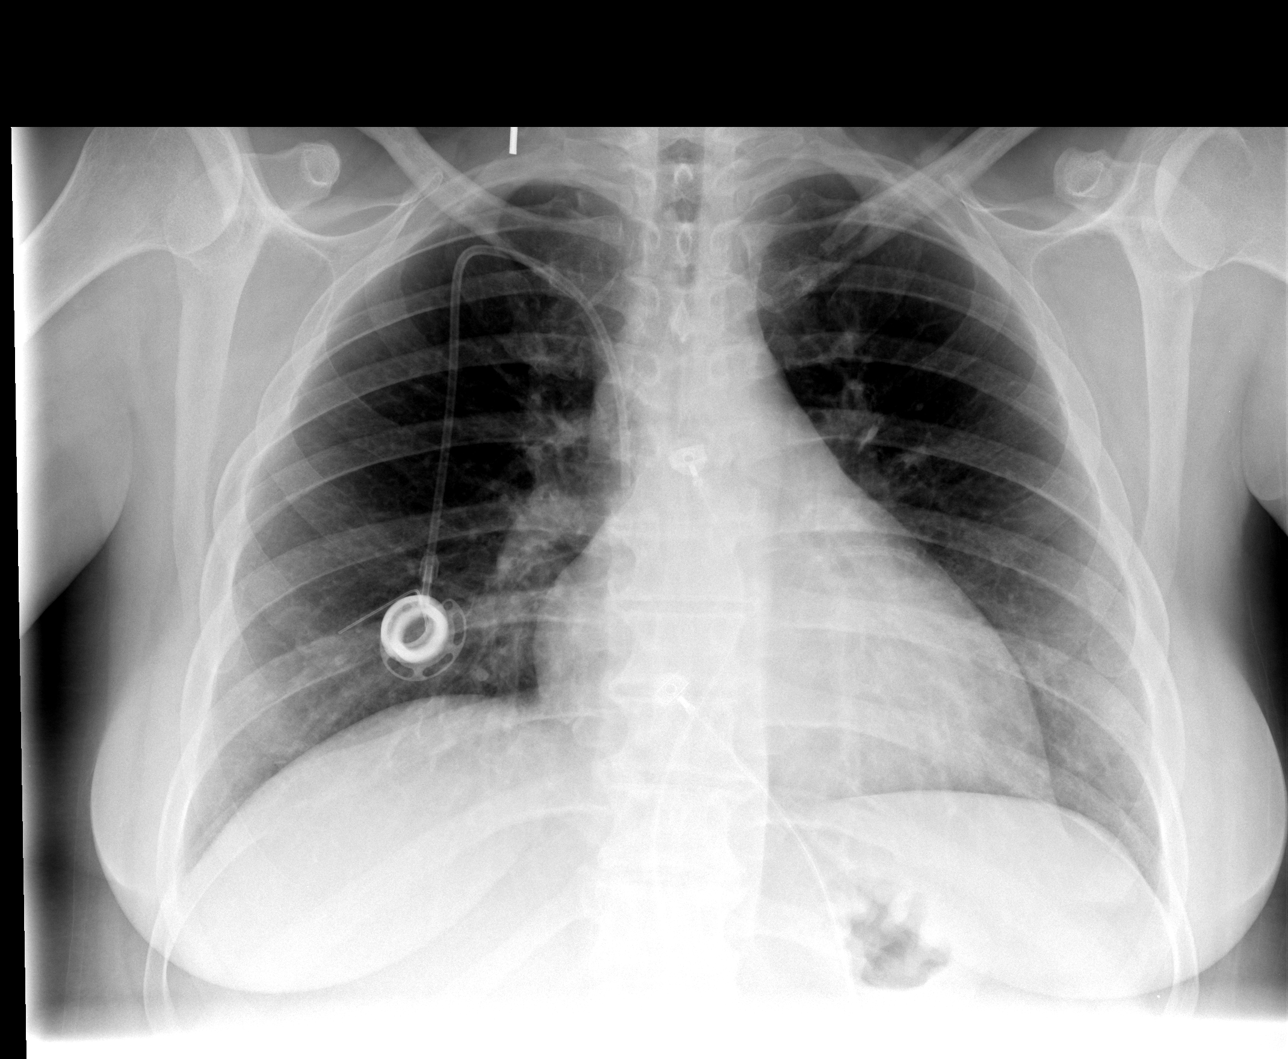

[view not recorded (2 of 3)]
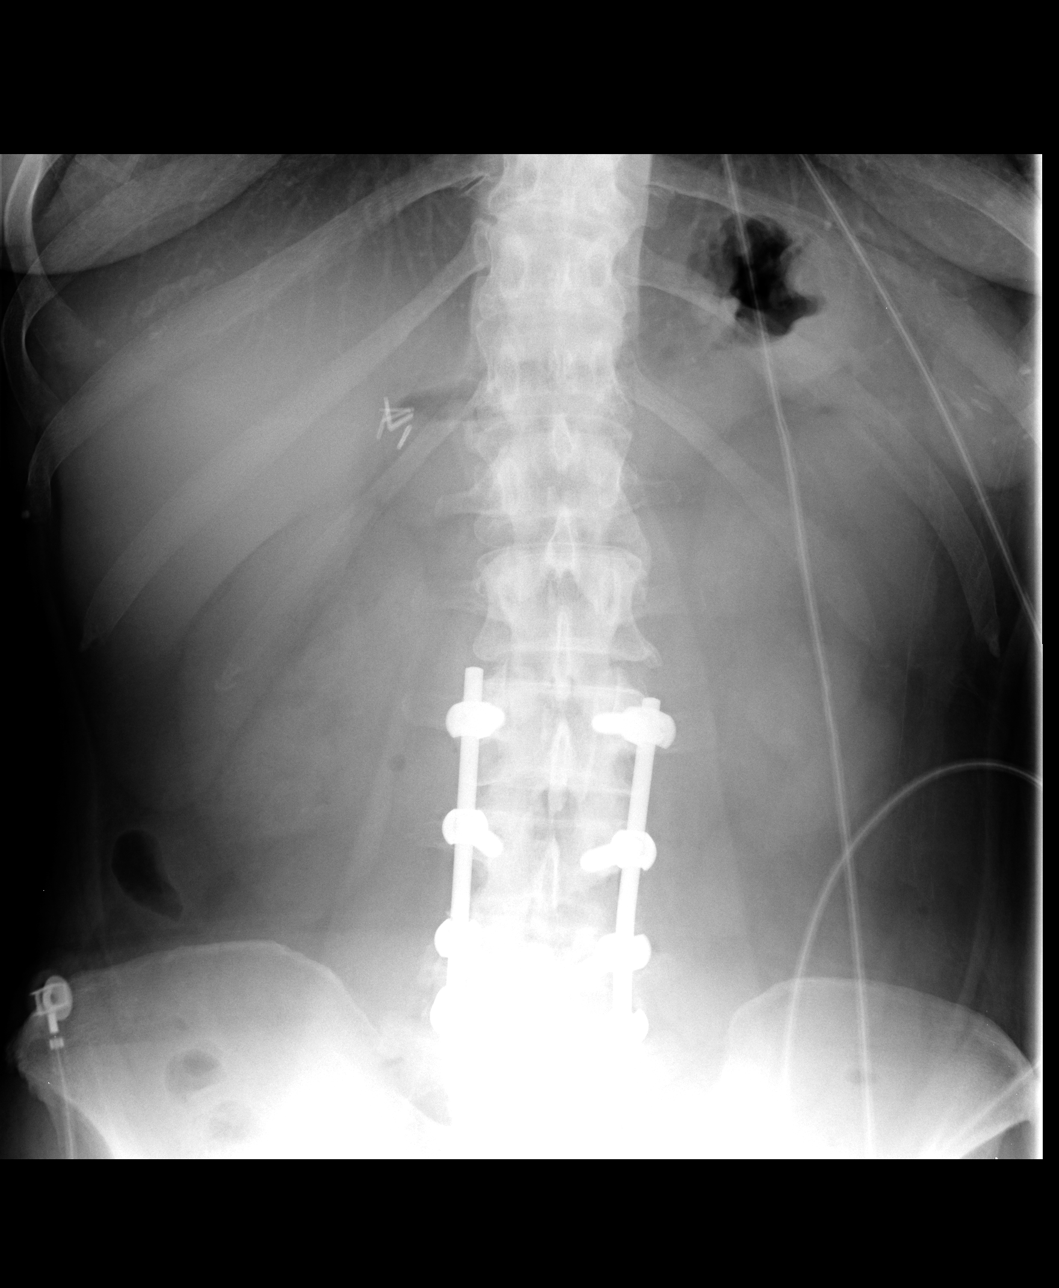

[view not recorded (3 of 3)]
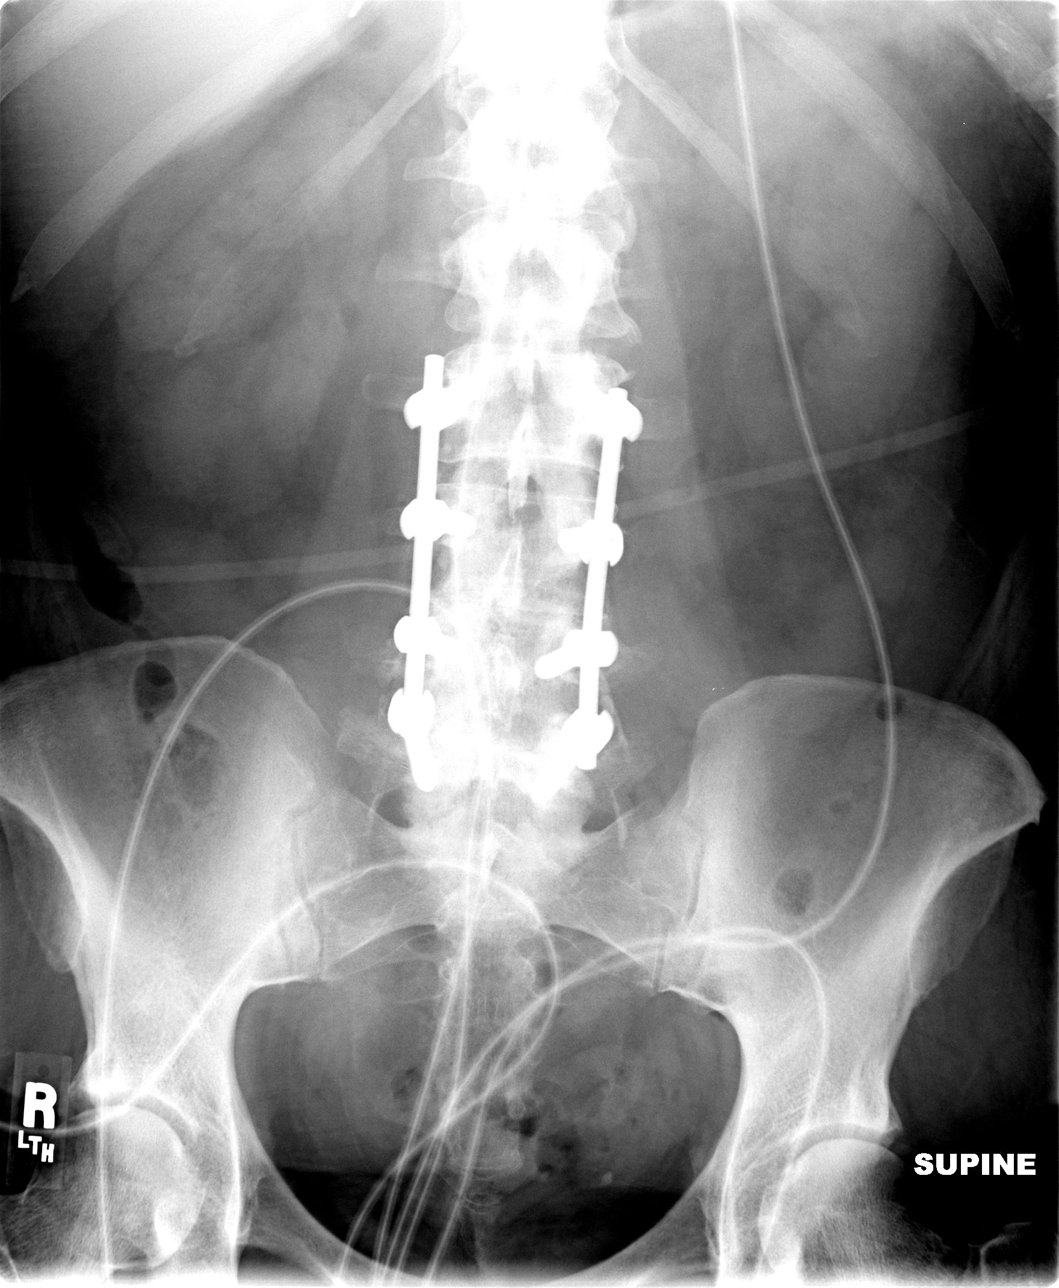

[3 of 3 positions shown; findings below may reference images not displayed]

FINDINGS: Upright view of the chest demonstrates a right Port-A-Cath with tip overlying the SVC.  The lungs are clear and stable.  No evidence for free air.  Heart size is stable.  Trachea is midline.  Stable bone findings.  
 Stable postoperative changes in the lower lumbar spine with pedicle screw fixation.  Evidence of cholecystectomy.  Paucity of gas throughout the abdomen.  Small amount of gas in the colon.  No acute bone abnormalities.
IMPRESSION: 1. No acute findings on this study.  
 2. Paucity of bowel gas with a small amount of gas in the colon which is nonspecific.

## 2007-06-17 IMAGING — CR DG ABDOMEN ACUTE W/ 1V CHEST
3 series · 3 of 3 positions shown · non-contrast
Comparison: none

CLINICAL DATA: Abdominal pain.
 ACUTE ABDOMINAL SERIES WITH CHEST:

[view not recorded (1 of 3)]
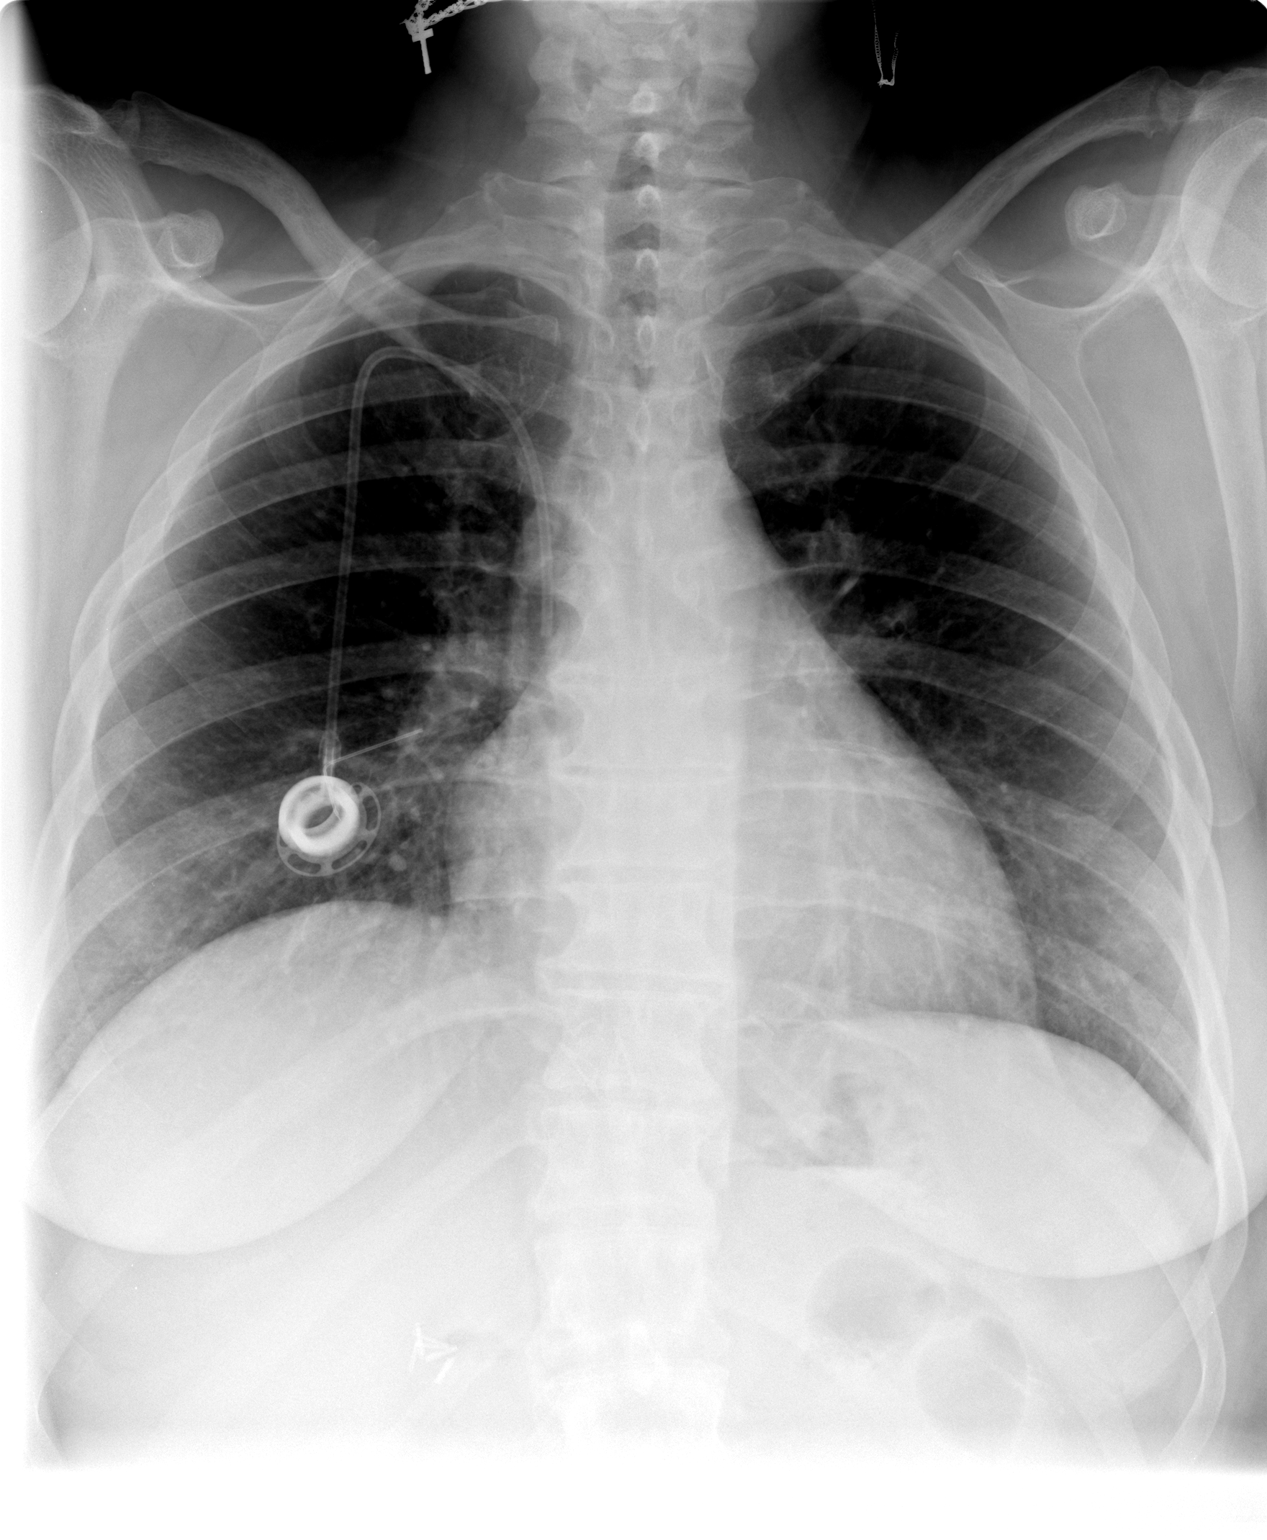

[view not recorded (2 of 3)]
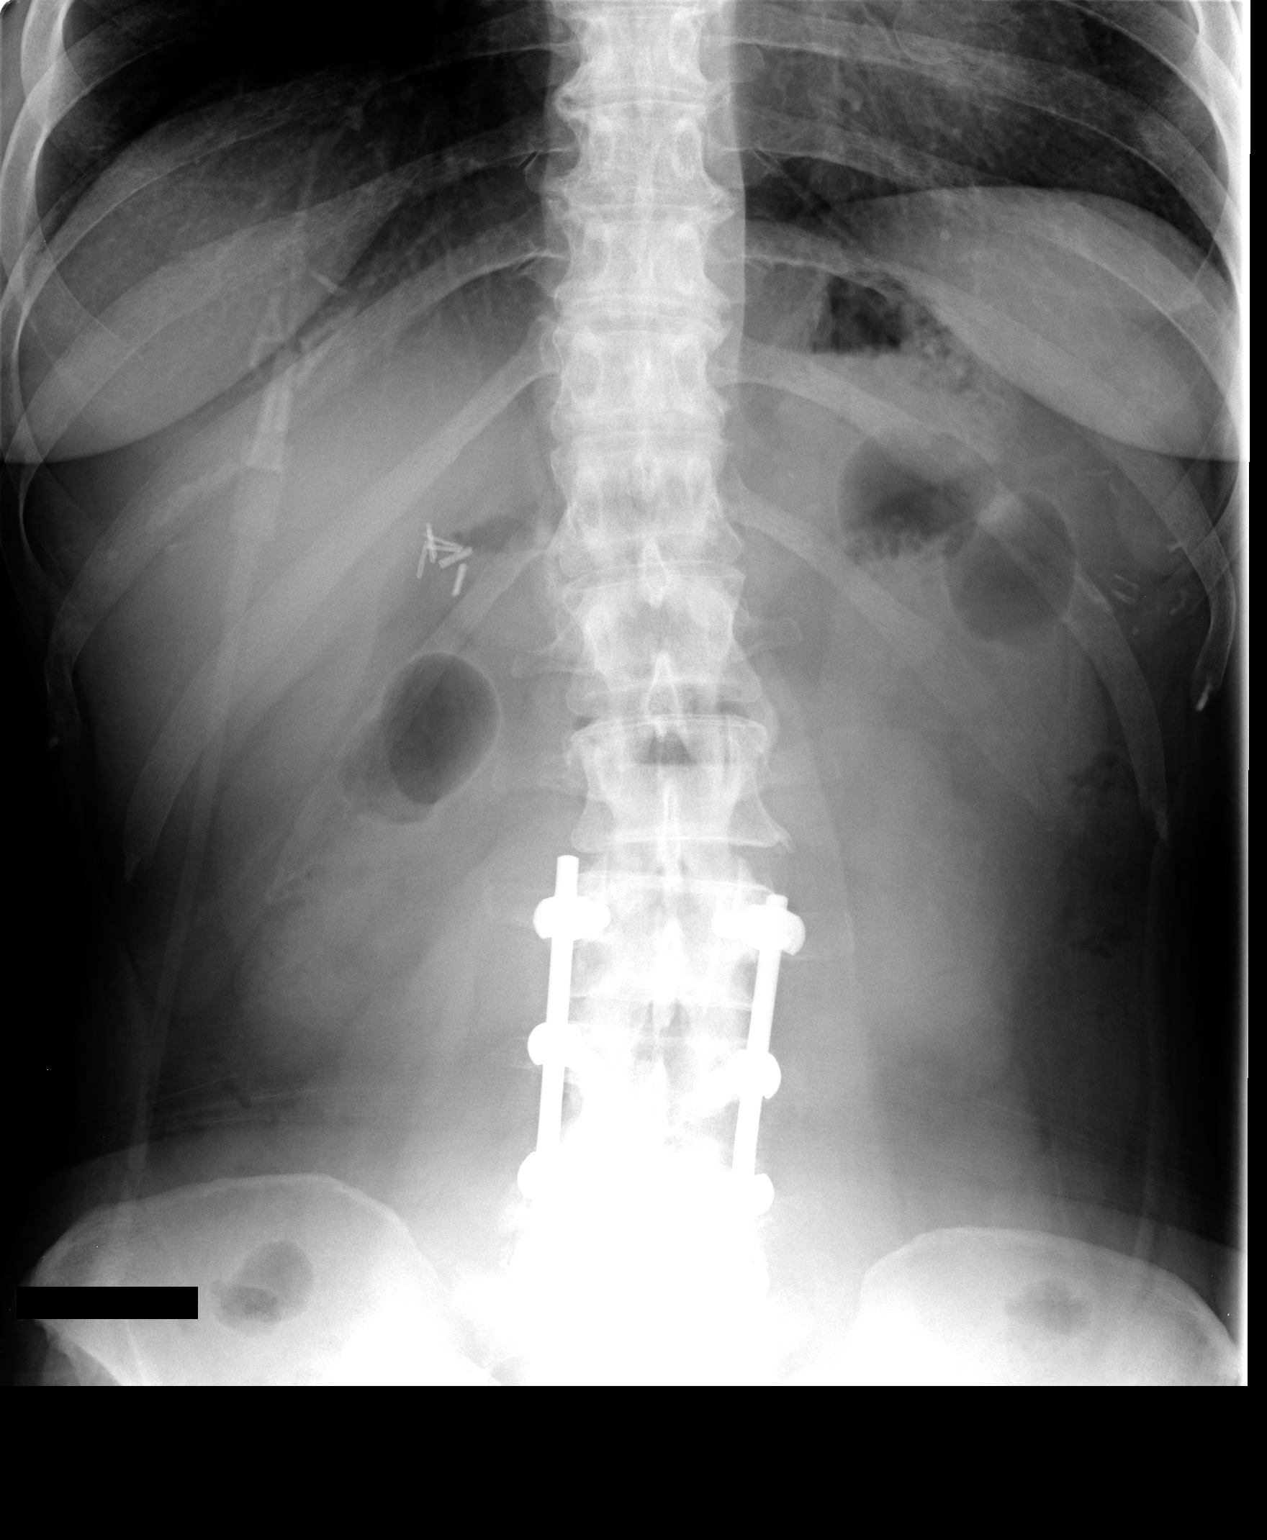

[view not recorded (3 of 3)]
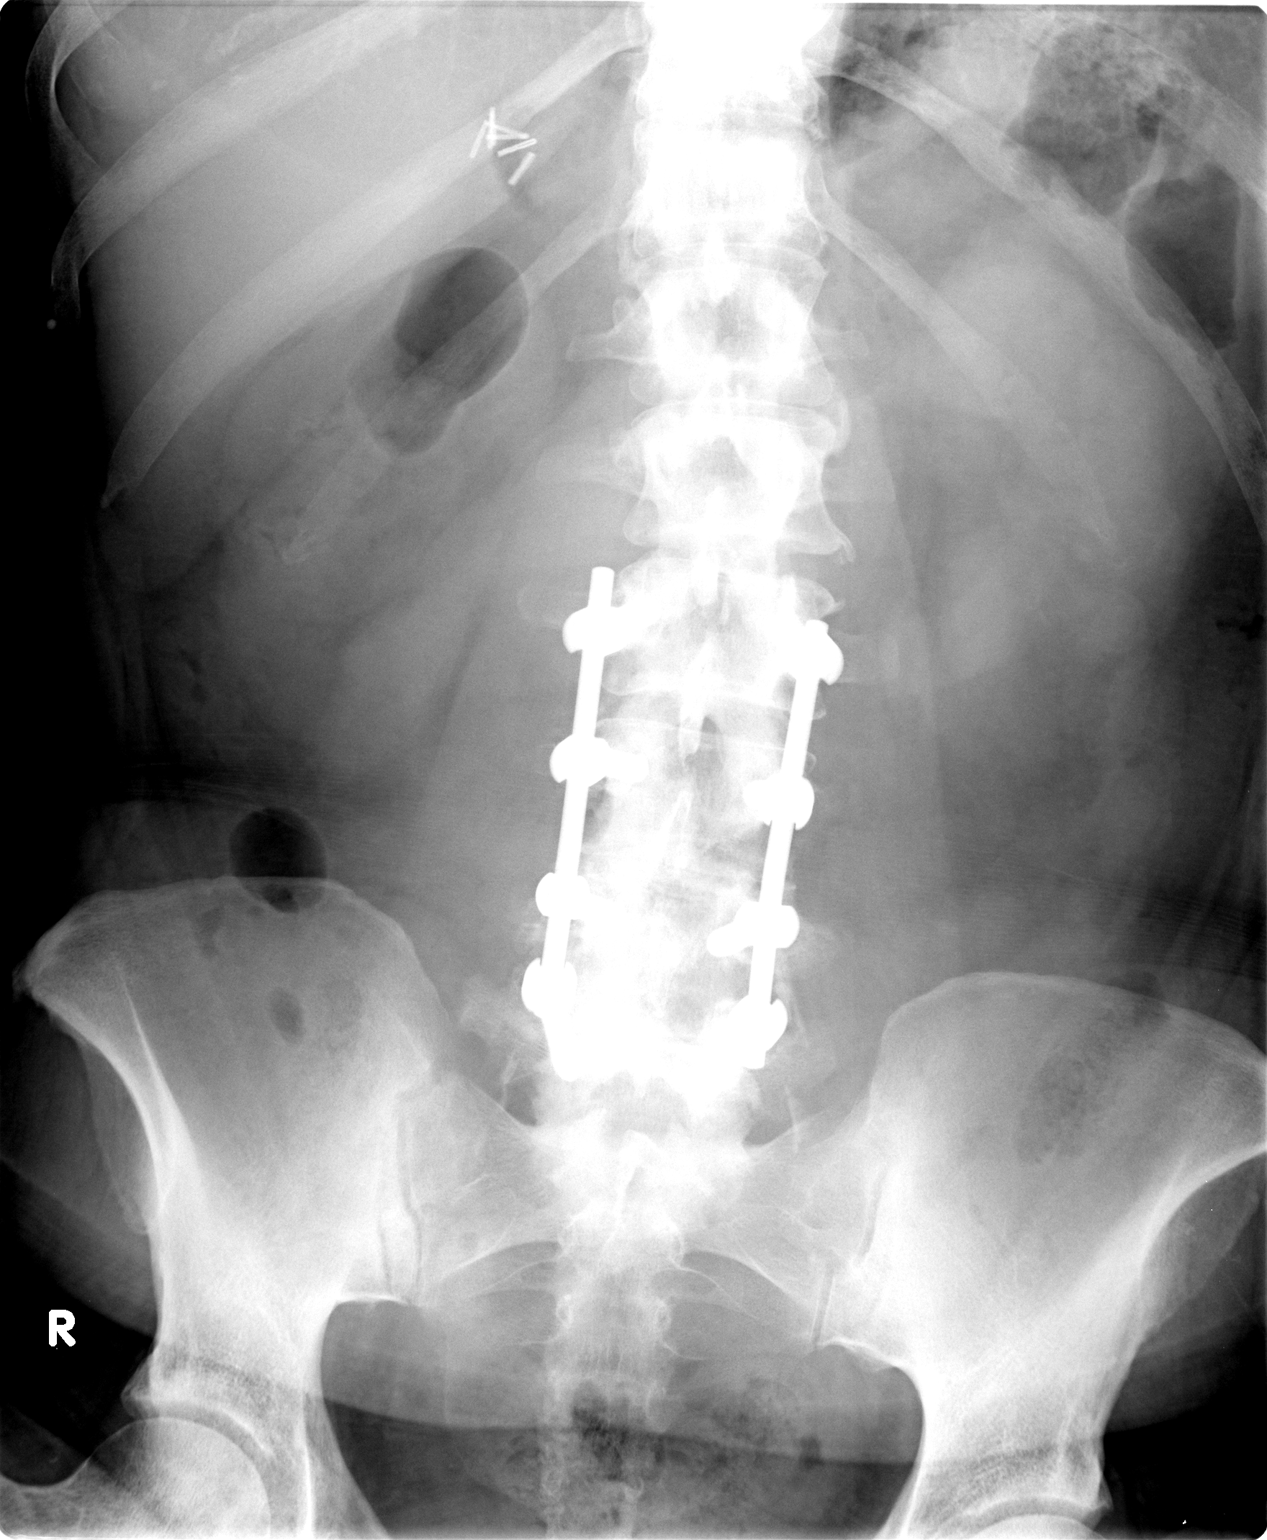

[3 of 3 positions shown; findings below may reference images not displayed]

FINDINGS: On the KUB, the inferior pelvis was not included.  The patient refused a repeat KUB.  
 The heart is normal in size.  The Port-a-Cath is stable.  In the abdomen, there is no free intraperitoneal gas.  Postoperative changes are stable.  There are no disproportionately dilated loops of bowel or air fluid levels.
IMPRESSION: Nonobstructive bowel gas pattern.  Limited exam.

## 2007-06-26 ENCOUNTER — Encounter (HOSPITAL_COMMUNITY): Admission: RE | Admit: 2007-06-26 | Discharge: 2007-07-26 | Payer: Self-pay | Admitting: Family Medicine

## 2007-06-26 ENCOUNTER — Ambulatory Visit (HOSPITAL_COMMUNITY): Payer: Self-pay | Admitting: Family Medicine

## 2007-08-07 ENCOUNTER — Encounter (HOSPITAL_COMMUNITY): Admission: RE | Admit: 2007-08-07 | Discharge: 2007-08-27 | Payer: Self-pay | Admitting: Family Medicine

## 2007-08-25 ENCOUNTER — Emergency Department (HOSPITAL_COMMUNITY): Admission: EM | Admit: 2007-08-25 | Discharge: 2007-08-25 | Payer: Self-pay | Admitting: Emergency Medicine

## 2007-09-06 IMAGING — CR DG ABDOMEN 1V
1 series · 1 of 1 positions shown · non-contrast
Comparison: 06/19/2006

CLINICAL DATA: Abdominal pain

ABDOMEN - 1 VIEW

[view not recorded]
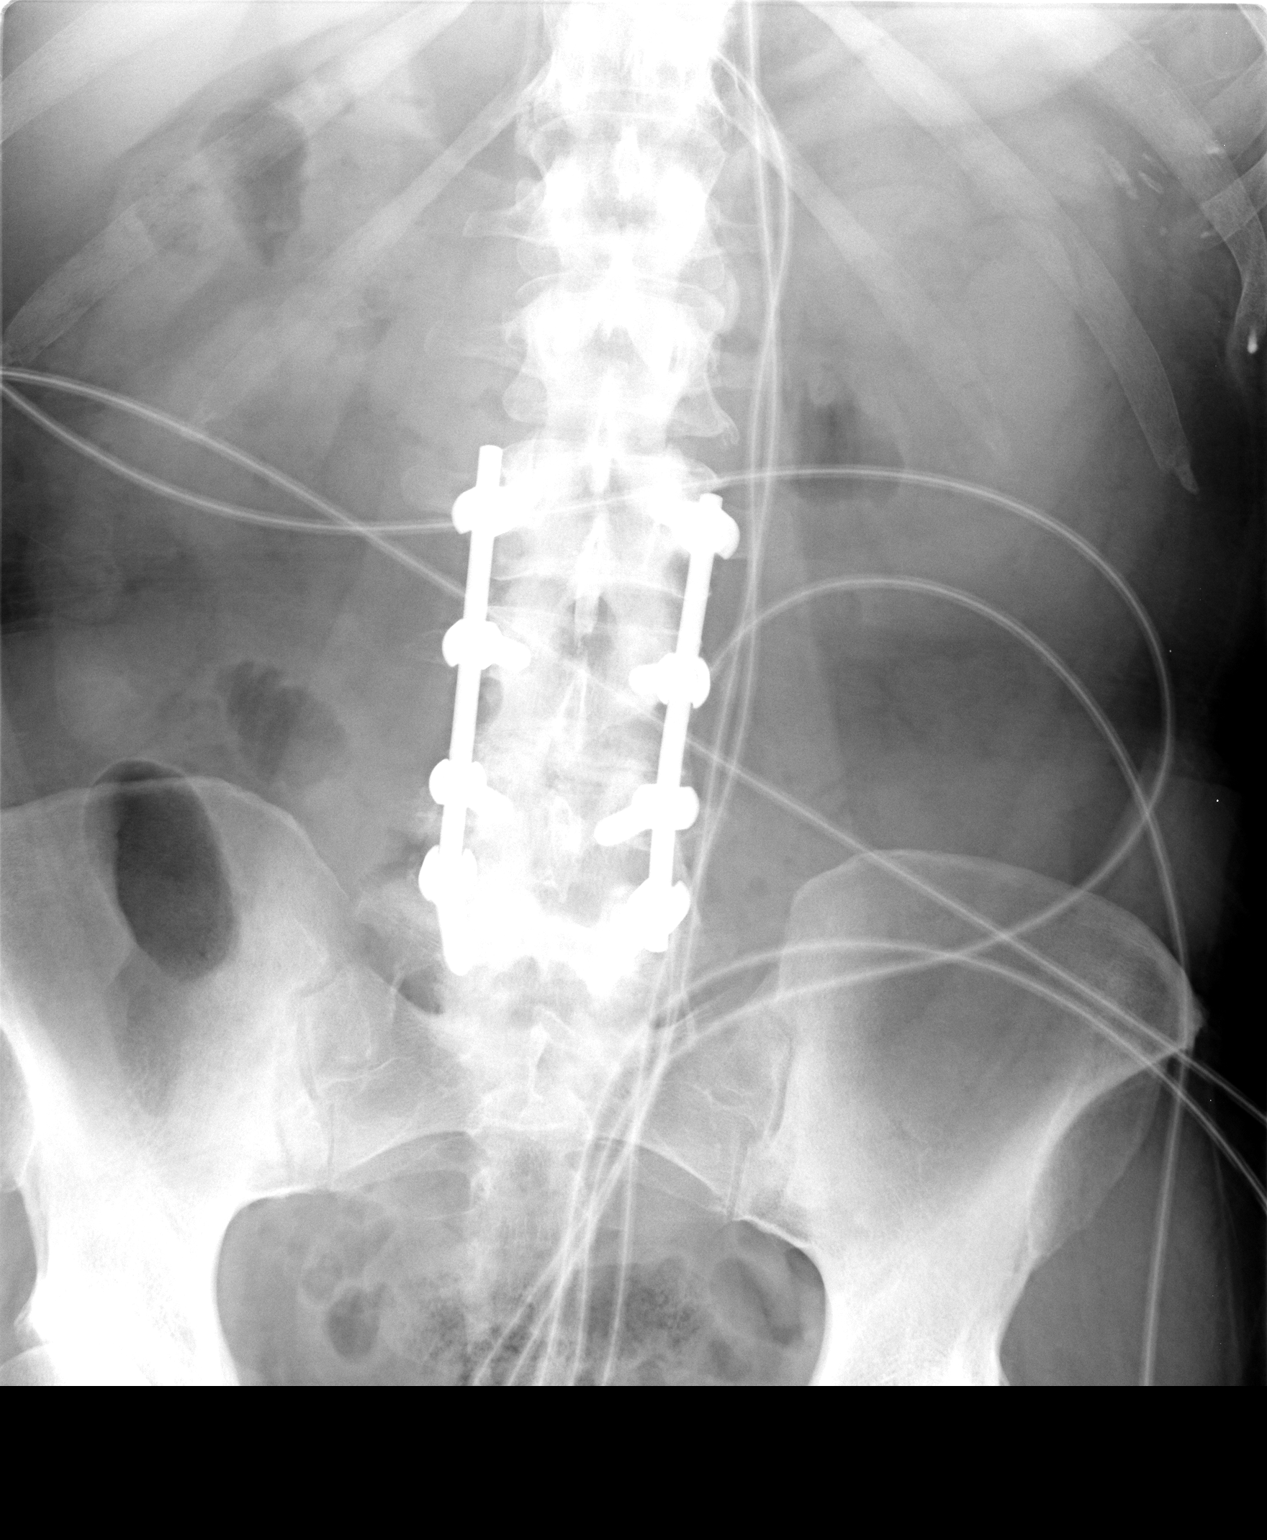

[1 of 1 positions shown; findings below may reference images not displayed]

FINDINGS: Posterolateral rod and pedicle screw fixation is present in the
lumbar spine and appears unchanged. Gas and stool are present in the colon. No
dilated bowel is identified. Both psoas shadows are well marginated. There
appears to be a transitional lumbosacral vertebra.

No significant abnormal calcific density is identified in the abdomen.
IMPRESSION: 1. No dilated bowel. Bowel gas pattern appears unremarkable.
2. Prior lumbar spine fixation.

## 2007-09-06 IMAGING — CR DG CHEST 1V PORT
1 series · 1 of 1 positions shown · non-contrast
Comparison: 03/21/06.

CLINICAL DATA: Chest pain and arm numbness.  
 PORTABLE CHEST ? 1 VIEW:

[view not recorded]
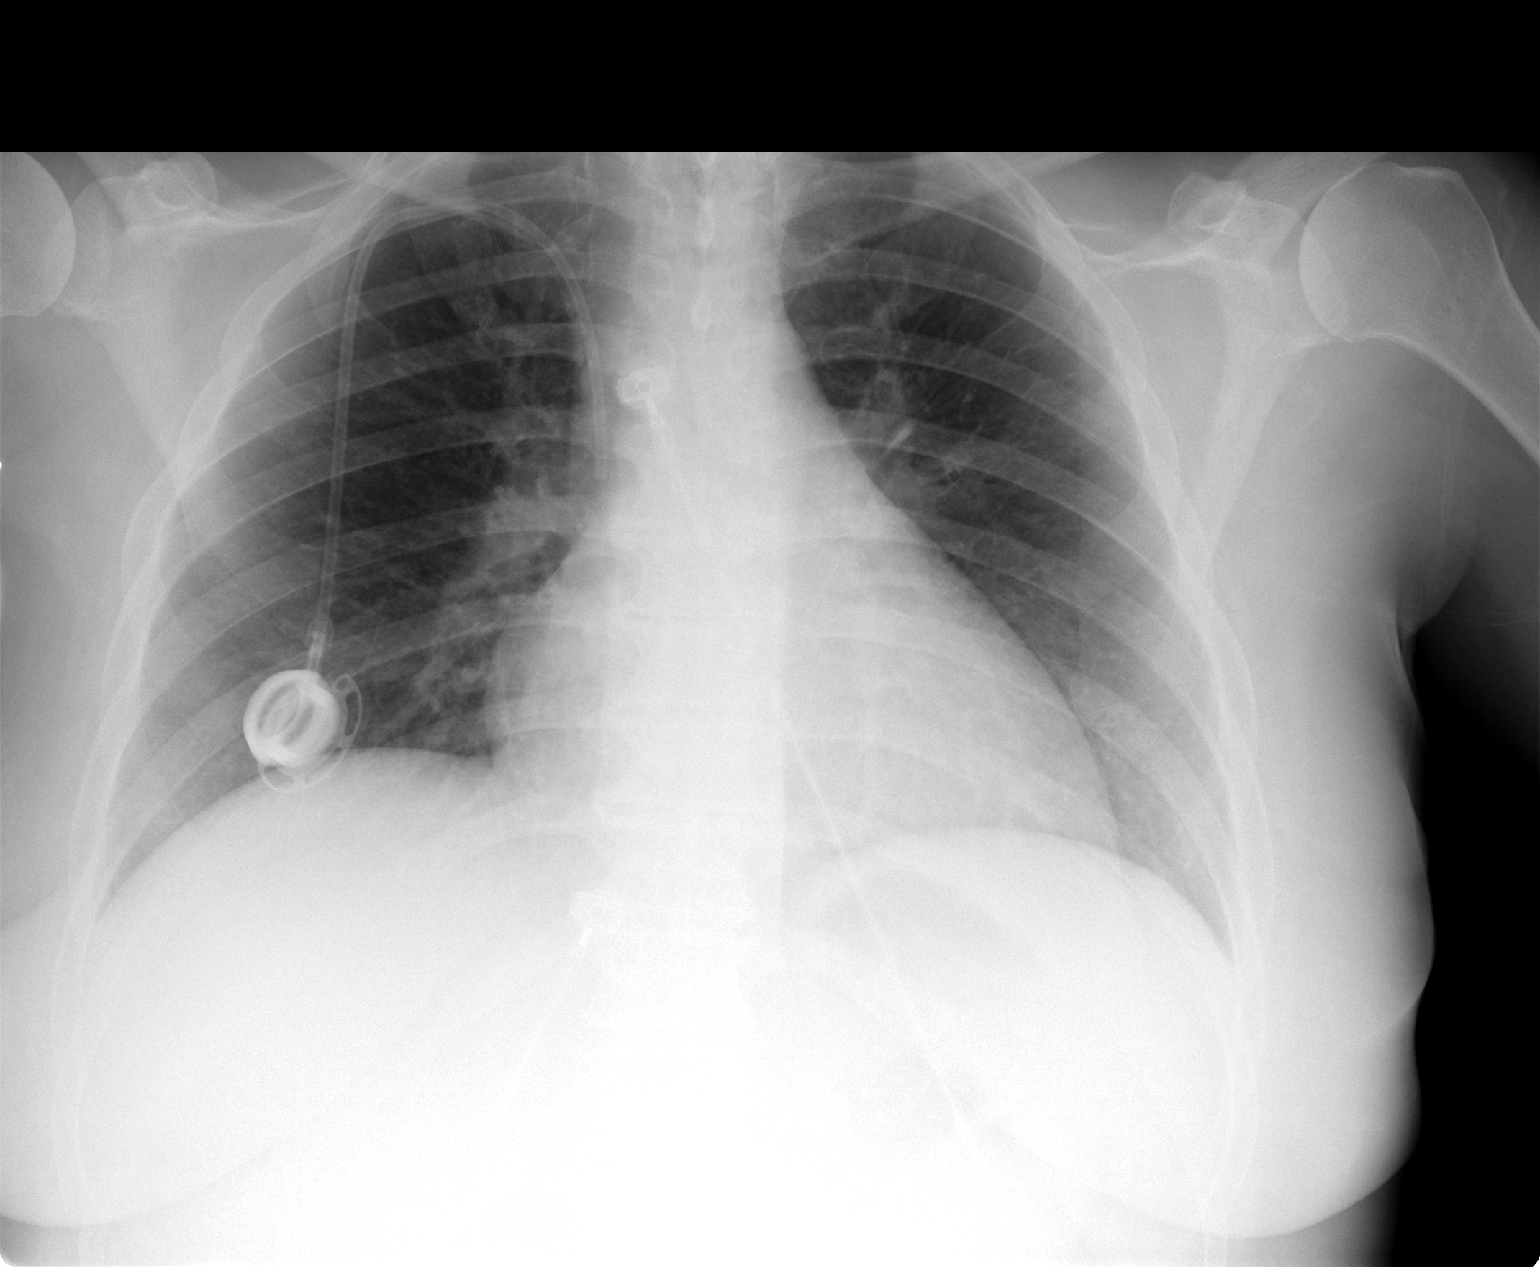

[1 of 1 positions shown; findings below may reference images not displayed]

FINDINGS: Heart size is normal.  Both lungs are clear.  Right-sided Port-A-Cath remains in appropriate position.
IMPRESSION: No active disease.

## 2007-09-20 IMAGING — CR DG ABDOMEN ACUTE W/ 1V CHEST
3 series · 3 of 3 positions shown · non-contrast
Comparison: 06/19/2006.

CLINICAL DATA: Chest pain and abdominal pain and vomiting.
 ACUTE ABDOMINAL SERIES:

[view not recorded (1 of 3)]
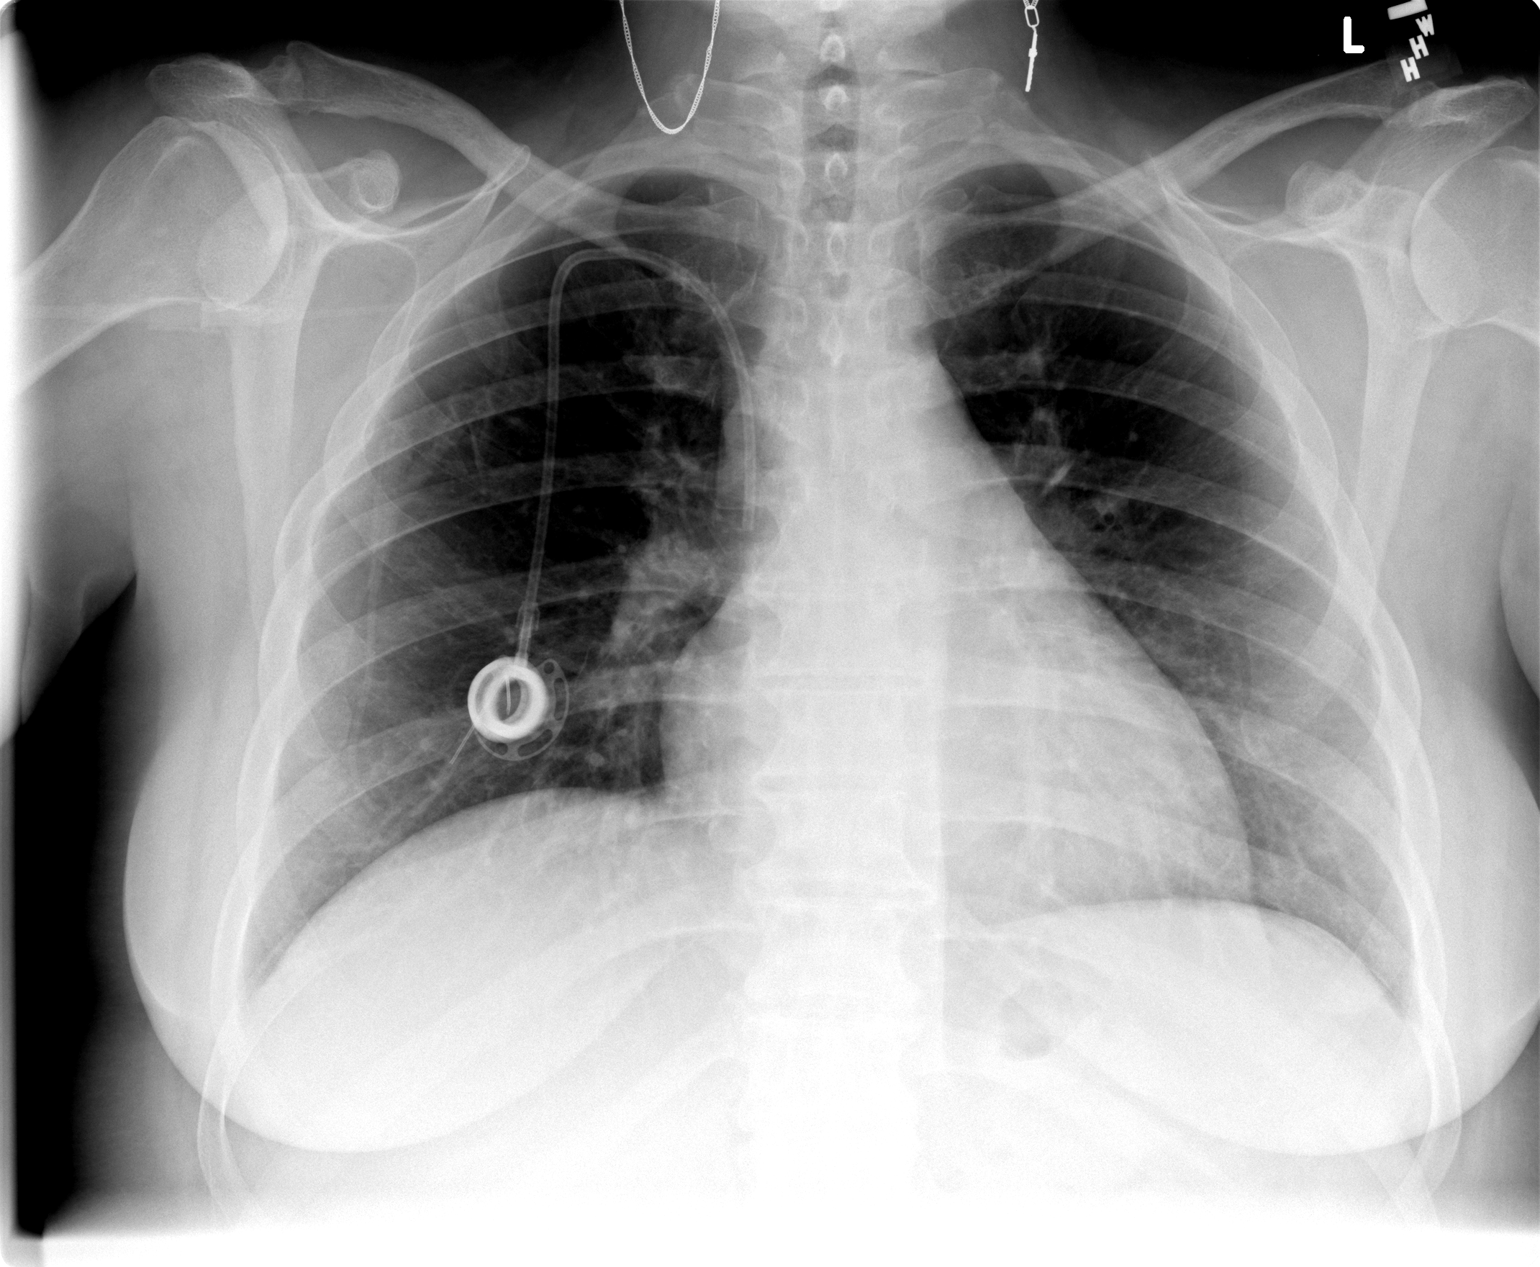

[view not recorded (2 of 3)]
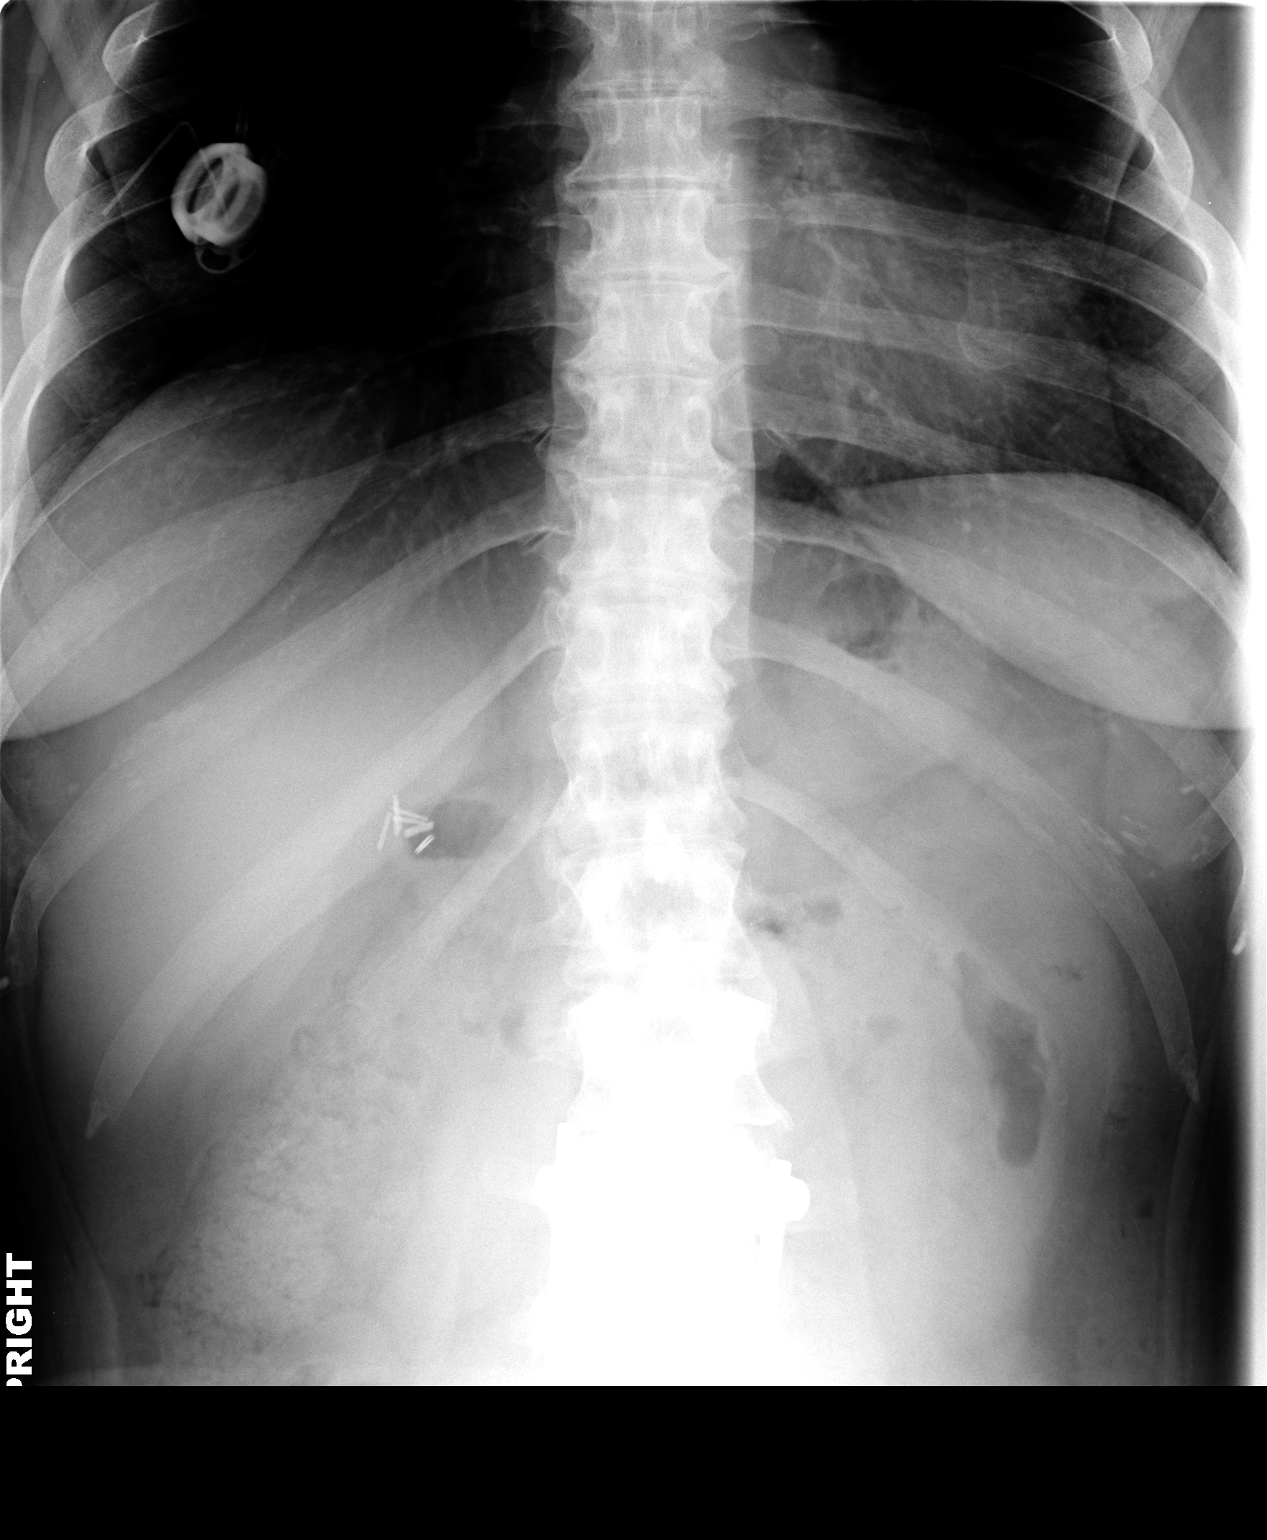

[view not recorded (3 of 3)]
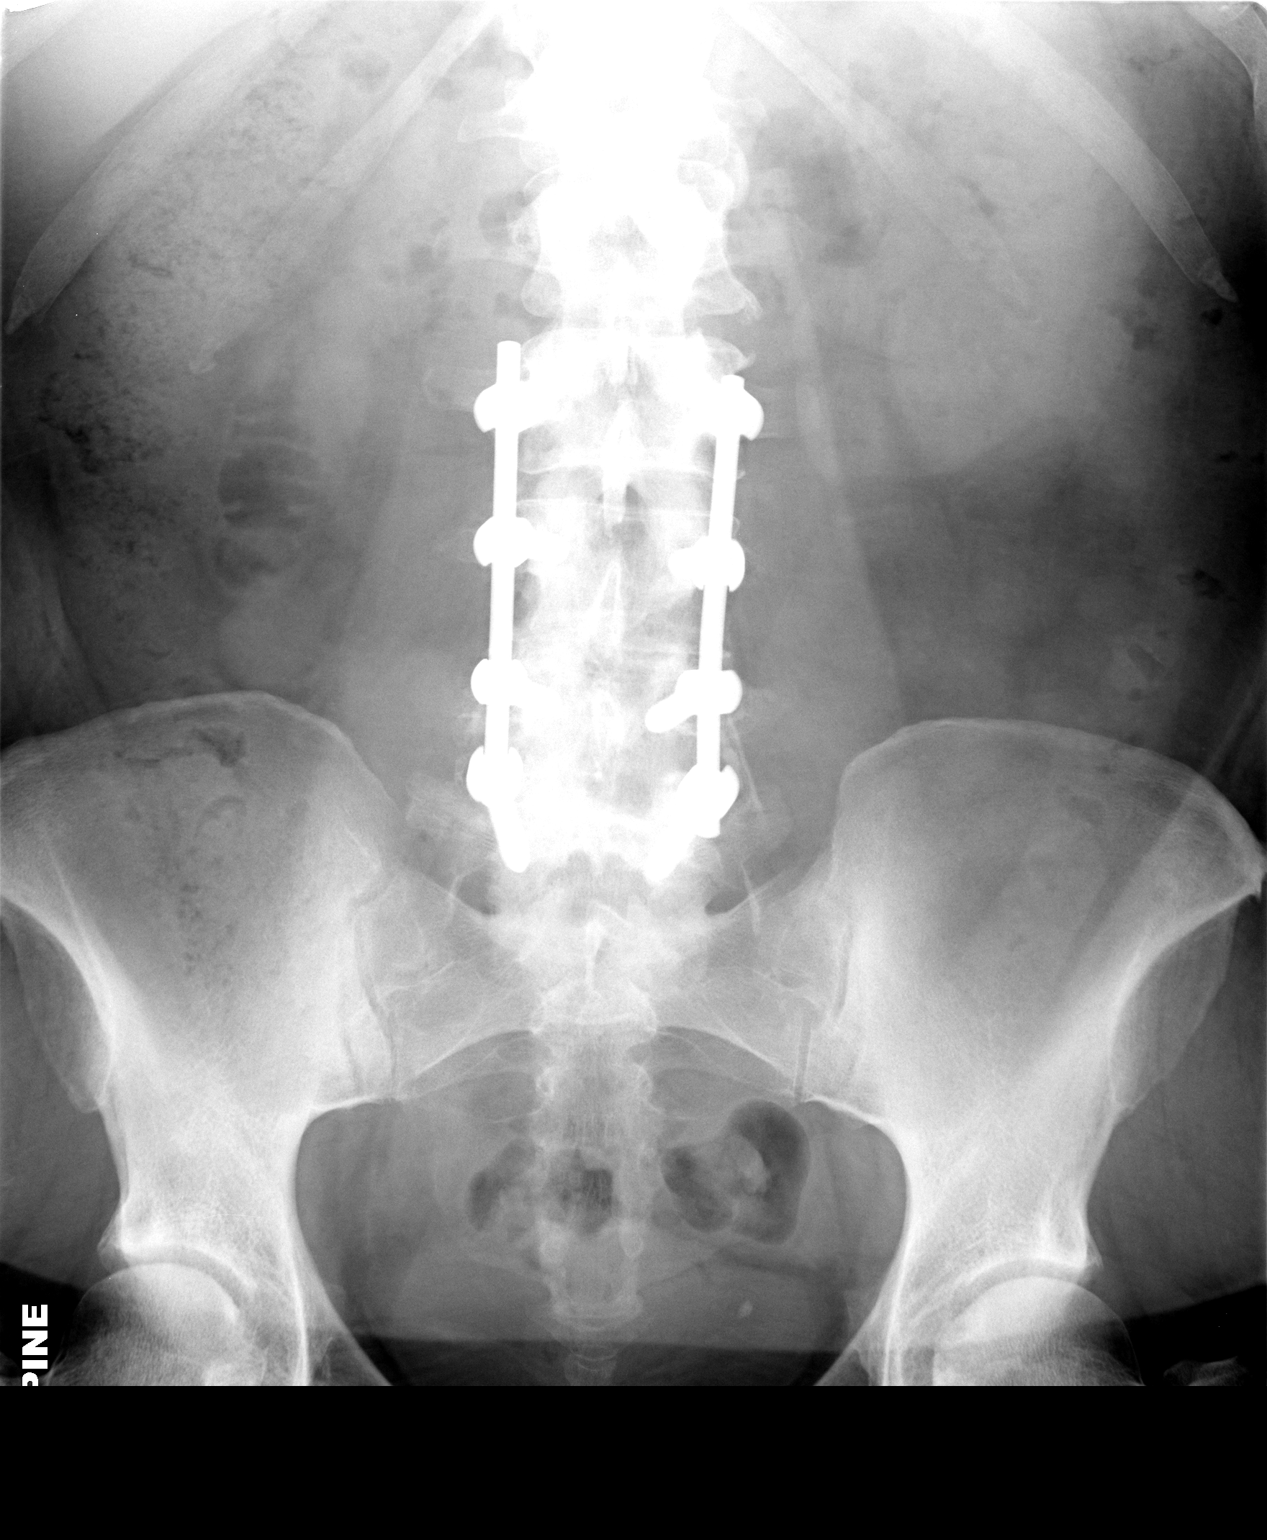

[3 of 3 positions shown; findings below may reference images not displayed]

FINDINGS: heart size and vascularity are normal and the lungs are clear.  There is a Port-A-Cath in place.  Evidence of previous cholecystectomy and previous lumbar spine surgery.  No free air or free fluid in the abdomen.  The bowel gas pattern is normal.  There is a phlebolith in the left side of the pelvis.
IMPRESSION: Benign-appearing abdomen and chest.

## 2007-09-30 ENCOUNTER — Encounter (HOSPITAL_COMMUNITY): Admission: RE | Admit: 2007-09-30 | Discharge: 2007-10-30 | Payer: Self-pay | Admitting: Family Medicine

## 2007-09-30 ENCOUNTER — Ambulatory Visit (HOSPITAL_COMMUNITY): Payer: Self-pay | Admitting: Family Medicine

## 2007-10-14 ENCOUNTER — Encounter: Payer: Self-pay | Admitting: Cardiology

## 2007-11-11 ENCOUNTER — Encounter (HOSPITAL_COMMUNITY): Admission: RE | Admit: 2007-11-11 | Discharge: 2007-12-11 | Payer: Self-pay | Admitting: Family Medicine

## 2007-11-13 ENCOUNTER — Emergency Department (HOSPITAL_COMMUNITY): Admission: EM | Admit: 2007-11-13 | Discharge: 2007-11-13 | Payer: Self-pay | Admitting: Emergency Medicine

## 2007-12-01 ENCOUNTER — Ambulatory Visit: Payer: Self-pay | Admitting: Cardiology

## 2007-12-01 ENCOUNTER — Encounter: Payer: Self-pay | Admitting: Physician Assistant

## 2007-12-02 ENCOUNTER — Ambulatory Visit (HOSPITAL_COMMUNITY): Payer: Self-pay | Admitting: Oncology

## 2007-12-10 ENCOUNTER — Ambulatory Visit: Payer: Self-pay | Admitting: Cardiology

## 2007-12-17 ENCOUNTER — Ambulatory Visit (HOSPITAL_COMMUNITY): Payer: Self-pay | Admitting: Family Medicine

## 2007-12-17 ENCOUNTER — Encounter (HOSPITAL_COMMUNITY): Admission: RE | Admit: 2007-12-17 | Discharge: 2008-01-16 | Payer: Self-pay | Admitting: Family Medicine

## 2007-12-19 ENCOUNTER — Ambulatory Visit: Payer: Self-pay | Admitting: Cardiology

## 2007-12-21 IMAGING — CT CT CHEST W/ CM
2 of 3 series · 15 of 36 positions shown, 18 images · IV contrast (omnipaque)
Comparison: Chest x-ray 09/08/06.

CLINICAL DATA: Abdominal pain. Cough.
CHEST CT WITH CONTRAST:
TECHNIQUE: Multidetector CT imaging of the chest was performed following the standard protocol during bolus administration of intravenous contrast.   
Contrast:  80 ml Omnipaque 300 IV.

[Series 2: chestroutine 5.0 b40f · axial · 0.74mm/px · z∈[-203,+77]mm · 12 of 66 slices shown, 15 images]
[im 5/66  mediastinal]
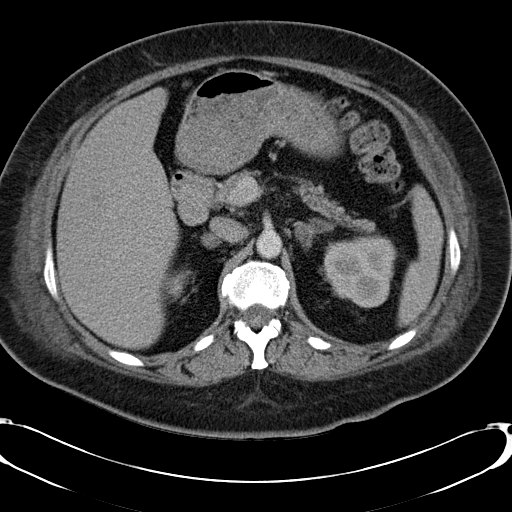
[im 5/66  lung]
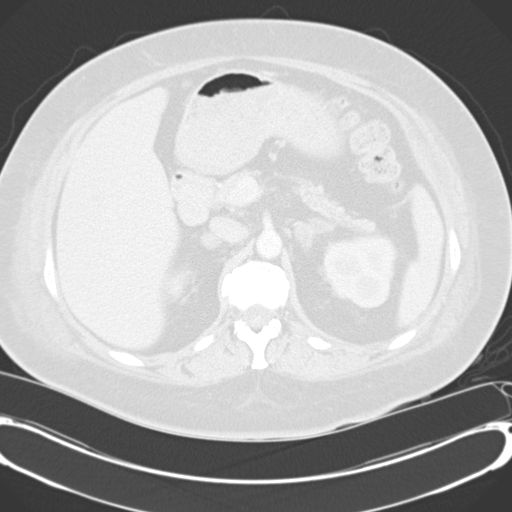
[im 10/66  lung]
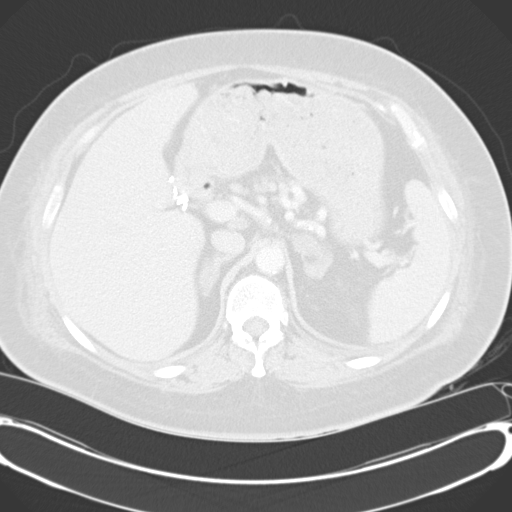
[im 15/66  lung]
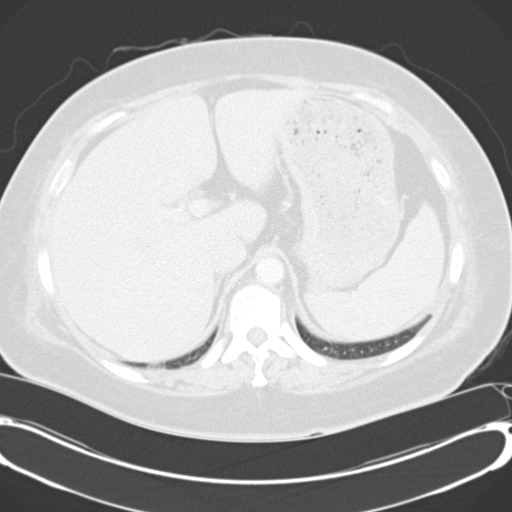
[im 20/66  lung]
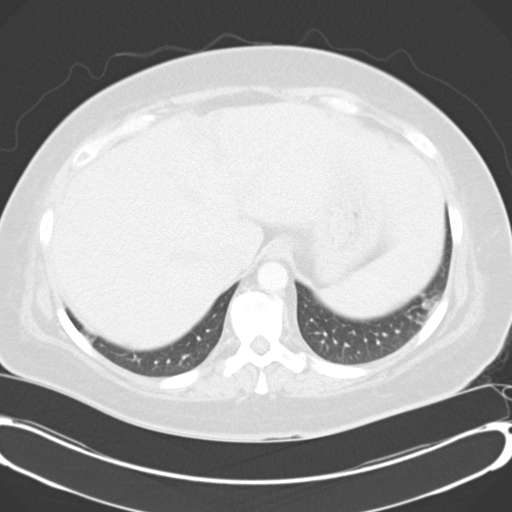
[im 25/66  mediastinal]
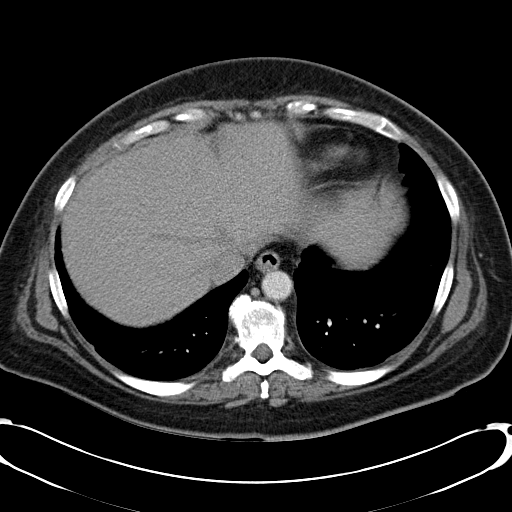
[im 25/66  lung]
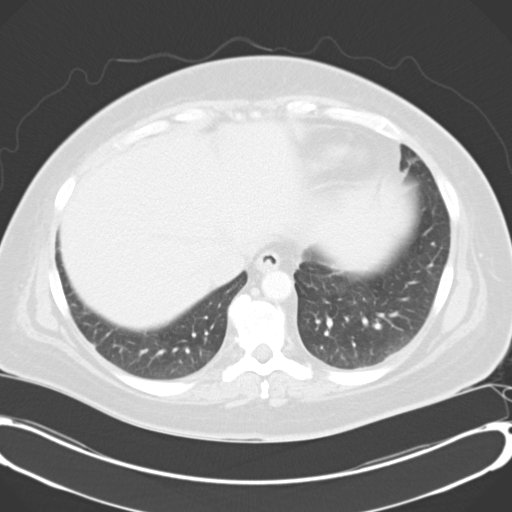
[im 29/66  lung]
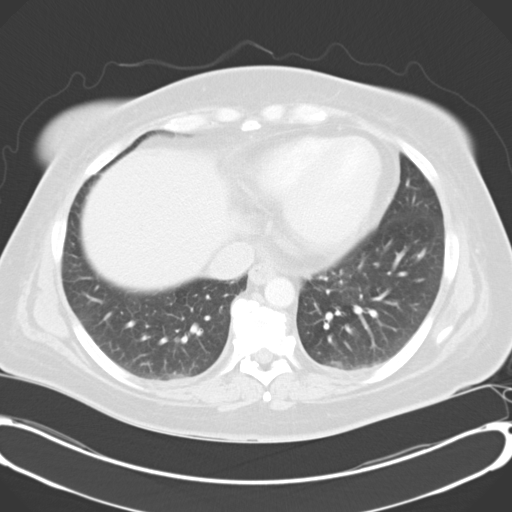
[im 37/66  lung]
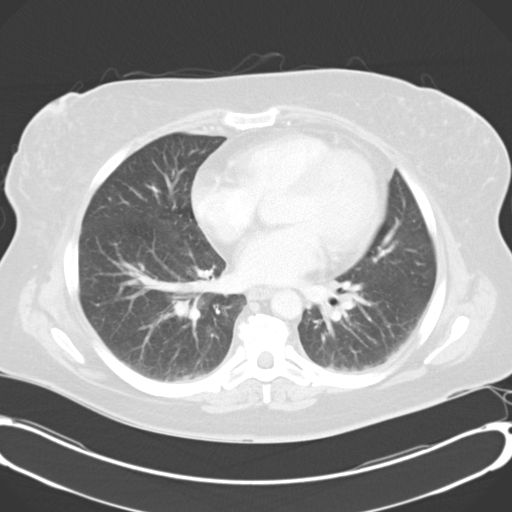
[im 41/66  lung]
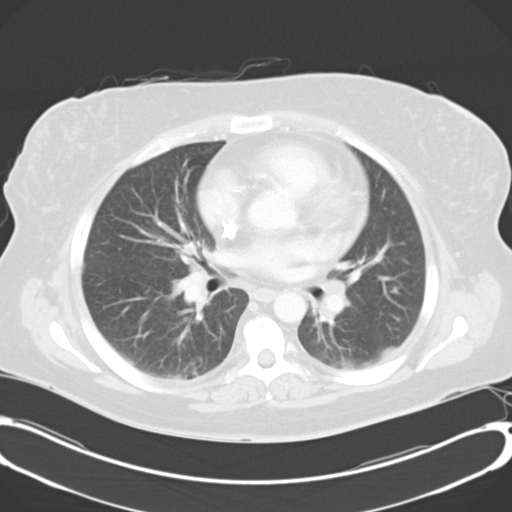
[im 46/66  mediastinal]
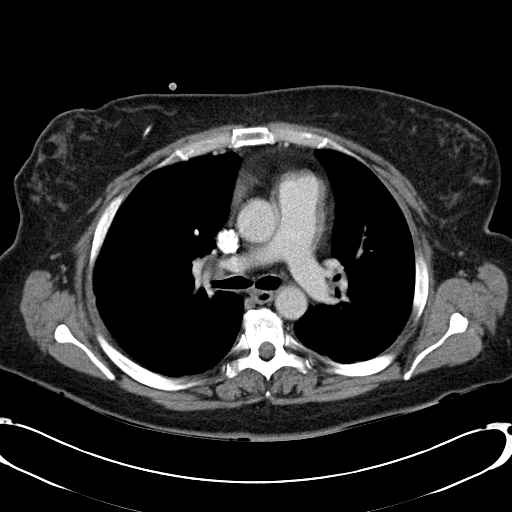
[im 46/66  lung]
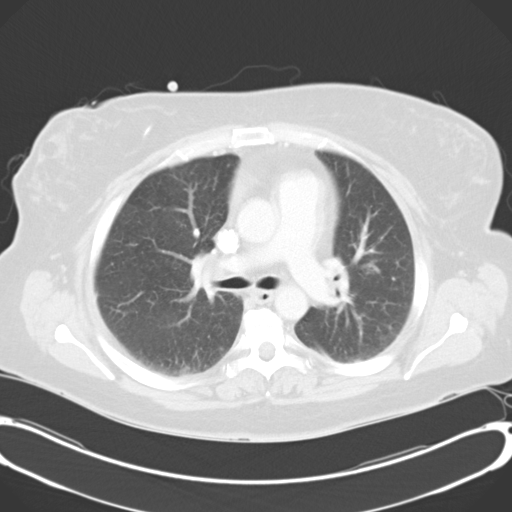
[im 51/66  lung]
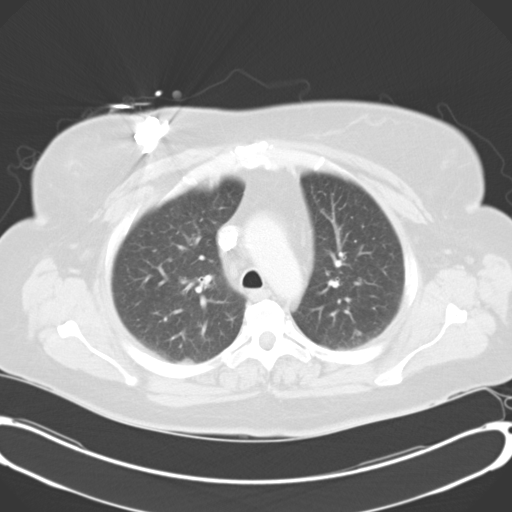
[im 56/66  lung]
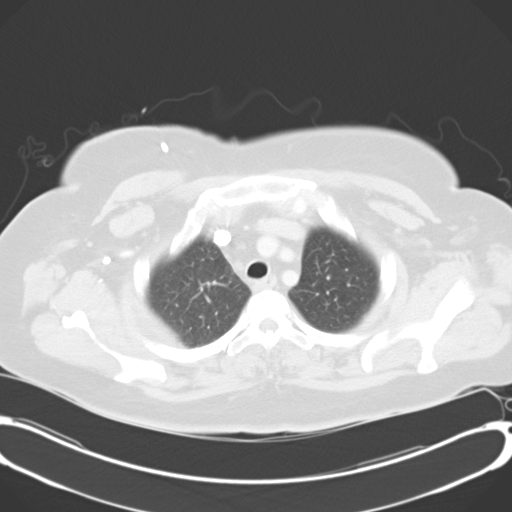
[im 61/66  lung]
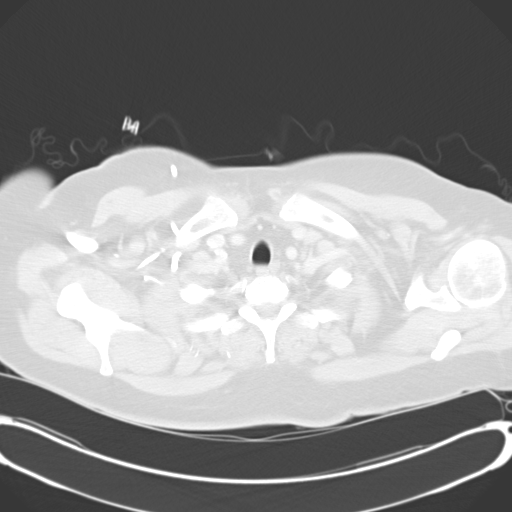

[Series 4: mpr coronal chest · coronal · 0.62mm/px · 3 of 92 slices shown]
[im 19/92  lung]
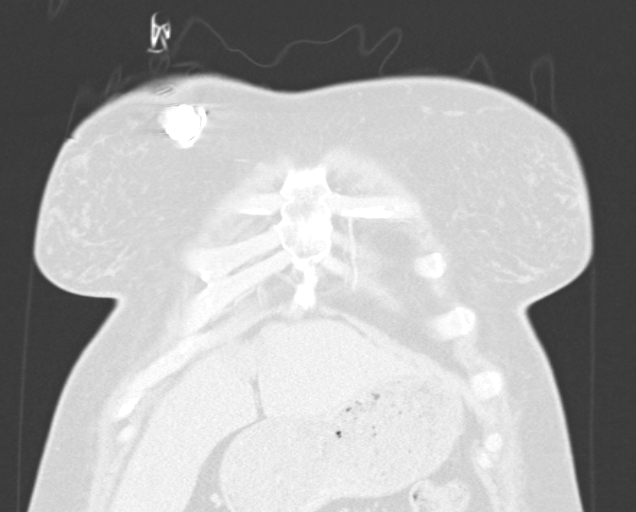
[im 37/92  lung]
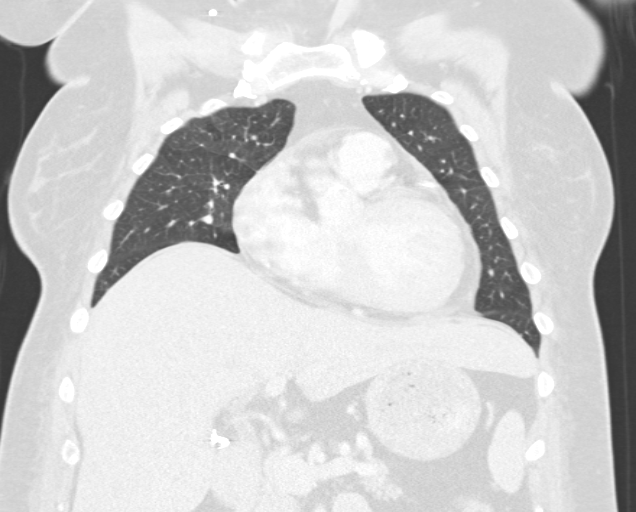
[im 55/92  lung]
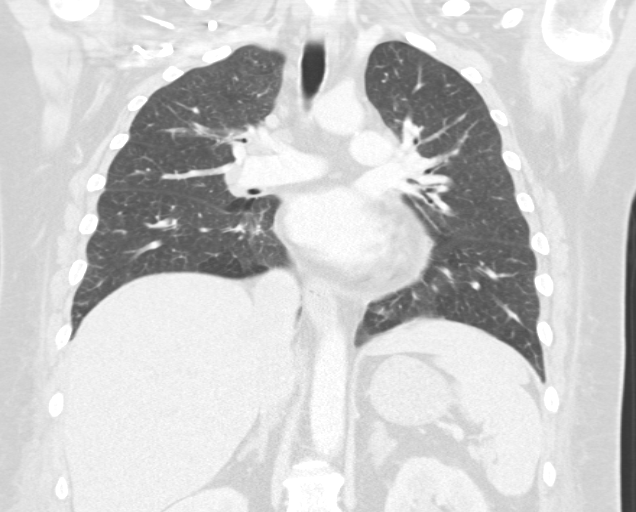

[15 of 36 positions shown; findings below may reference images not displayed]

FINDINGS: There is mild dependent atelectasis in the bases bilaterally.  No dominant mass is identified. There is no effusion and there is no significant mediastinal adenopathy.  There are some benign appearing paratracheal nodes.  
In the upper abdomen, there is a 2.8 x 3.1 cm mass in the left adrenal gland containing some fat most compatible with a benign lesion such as an myelolipoma.  The right adrenal gland is mildly prominent but without a mass lesion.
IMPRESSION: Mild dependent atelectasis in the lungs bilaterally.  No acute abnormality and no mass lesion is seen.
Left adrenal enlargement most compatible with myelolipoma.

## 2007-12-23 ENCOUNTER — Inpatient Hospital Stay (HOSPITAL_COMMUNITY): Admission: RE | Admit: 2007-12-23 | Discharge: 2007-12-25 | Payer: Self-pay | Admitting: Cardiology

## 2007-12-23 ENCOUNTER — Ambulatory Visit: Payer: Self-pay | Admitting: Cardiovascular Disease

## 2008-01-23 ENCOUNTER — Ambulatory Visit: Payer: Self-pay | Admitting: Cardiology

## 2008-02-18 DIAGNOSIS — Z8679 Personal history of other diseases of the circulatory system: Secondary | ICD-10-CM | POA: Insufficient documentation

## 2008-02-18 DIAGNOSIS — E119 Type 2 diabetes mellitus without complications: Secondary | ICD-10-CM

## 2008-02-19 ENCOUNTER — Ambulatory Visit: Payer: Self-pay | Admitting: Orthopedic Surgery

## 2008-02-19 DIAGNOSIS — S80859A Superficial foreign body, unspecified lower leg, initial encounter: Secondary | ICD-10-CM

## 2008-02-19 DIAGNOSIS — S70259A Superficial foreign body, unspecified hip, initial encounter: Secondary | ICD-10-CM

## 2008-02-19 DIAGNOSIS — S90559A Superficial foreign body, unspecified ankle, initial encounter: Secondary | ICD-10-CM

## 2008-02-19 DIAGNOSIS — S70359A Superficial foreign body, unspecified thigh, initial encounter: Secondary | ICD-10-CM

## 2008-02-20 ENCOUNTER — Ambulatory Visit (HOSPITAL_COMMUNITY): Admission: RE | Admit: 2008-02-20 | Discharge: 2008-02-20 | Payer: Self-pay | Admitting: Orthopedic Surgery

## 2008-02-20 ENCOUNTER — Ambulatory Visit: Payer: Self-pay | Admitting: Orthopedic Surgery

## 2008-02-20 ENCOUNTER — Encounter: Payer: Self-pay | Admitting: Orthopedic Surgery

## 2008-02-24 ENCOUNTER — Ambulatory Visit: Payer: Self-pay | Admitting: Orthopedic Surgery

## 2008-03-16 ENCOUNTER — Encounter (HOSPITAL_COMMUNITY): Admission: RE | Admit: 2008-03-16 | Discharge: 2008-04-15 | Payer: Self-pay | Admitting: Family Medicine

## 2008-03-16 ENCOUNTER — Ambulatory Visit (HOSPITAL_COMMUNITY): Payer: Self-pay | Admitting: Family Medicine

## 2008-03-18 ENCOUNTER — Emergency Department (HOSPITAL_COMMUNITY): Admission: EM | Admit: 2008-03-18 | Discharge: 2008-03-18 | Payer: Self-pay | Admitting: Emergency Medicine

## 2008-04-01 ENCOUNTER — Telehealth: Payer: Self-pay | Admitting: Orthopedic Surgery

## 2008-04-03 ENCOUNTER — Inpatient Hospital Stay (HOSPITAL_COMMUNITY): Admission: EM | Admit: 2008-04-03 | Discharge: 2008-04-07 | Payer: Self-pay | Admitting: Emergency Medicine

## 2008-04-18 IMAGING — CR DG WRIST COMPLETE 3+V*R*
4 series · 4 of 4 positions shown · non-contrast
Comparison: none

CLINICAL DATA: Fall.
 RIGHT WRIST ? 4 VIEW:

[view not recorded (1 of 4)]
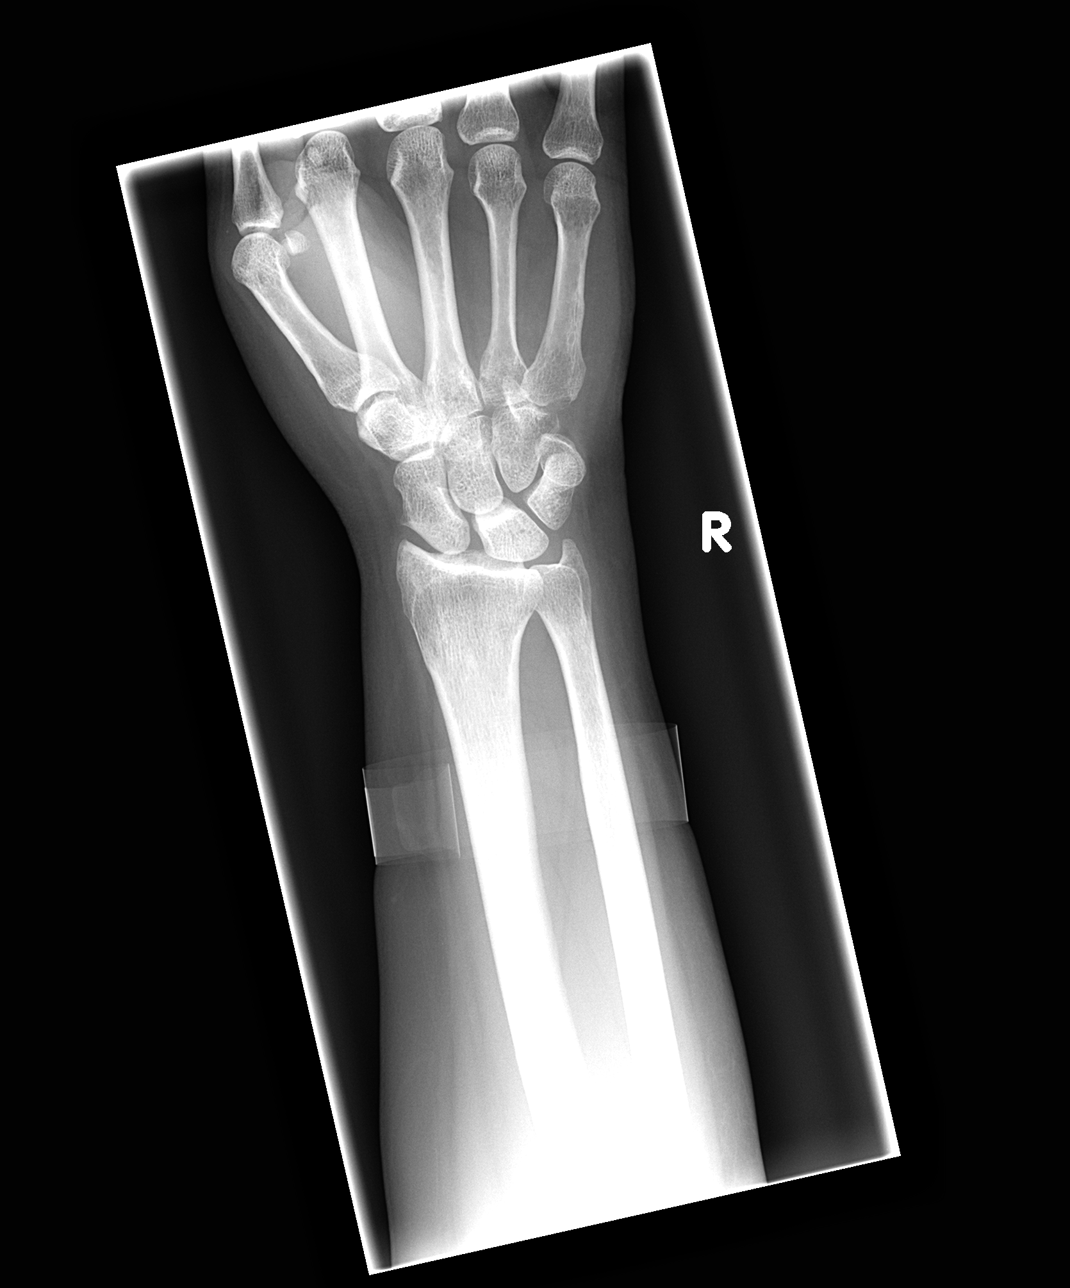

[view not recorded (2 of 4)]
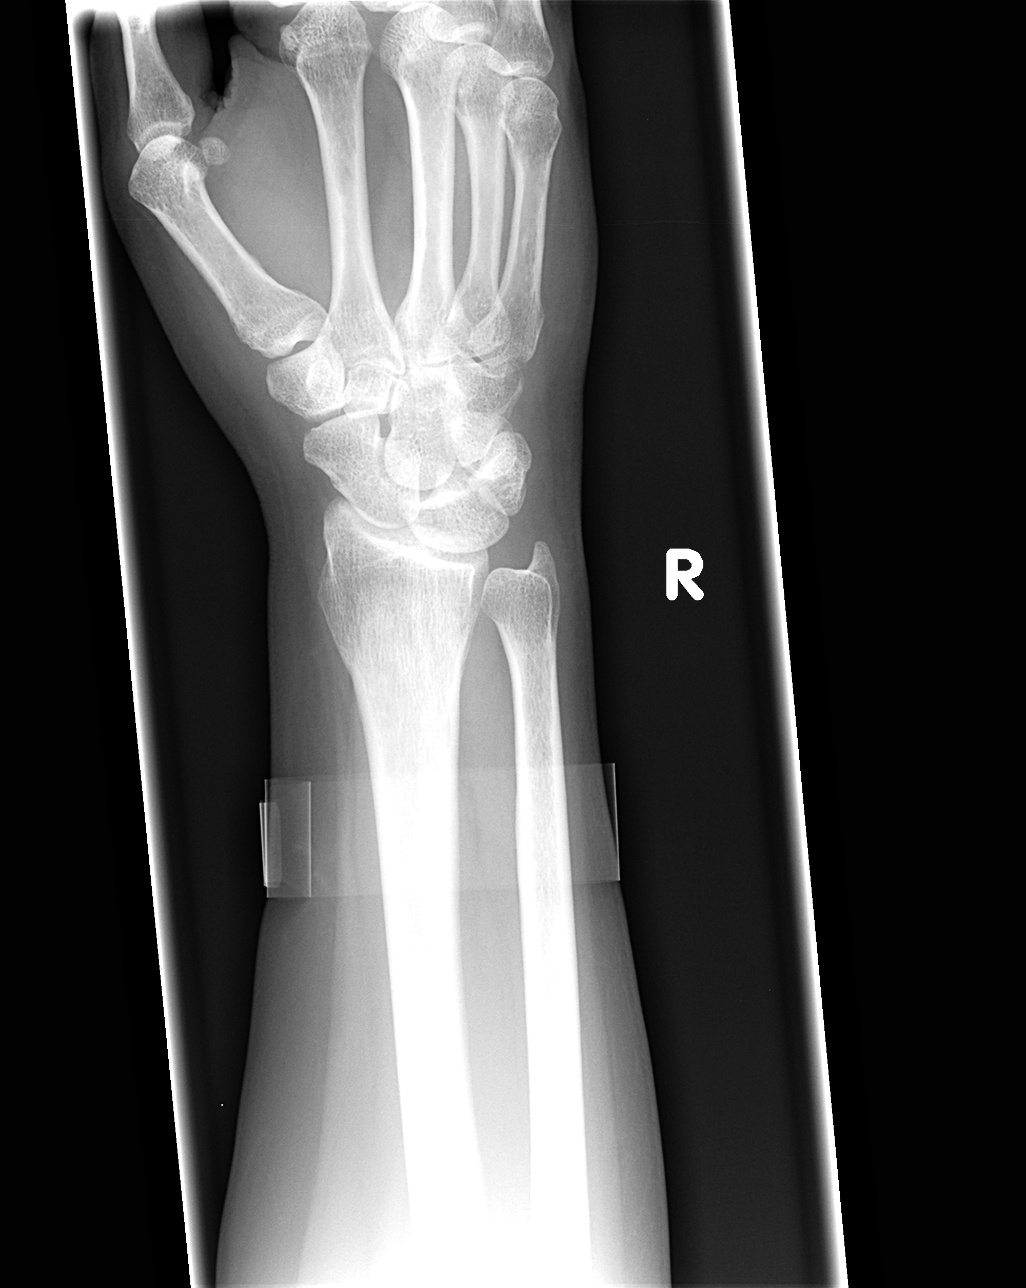

[view not recorded (3 of 4)]
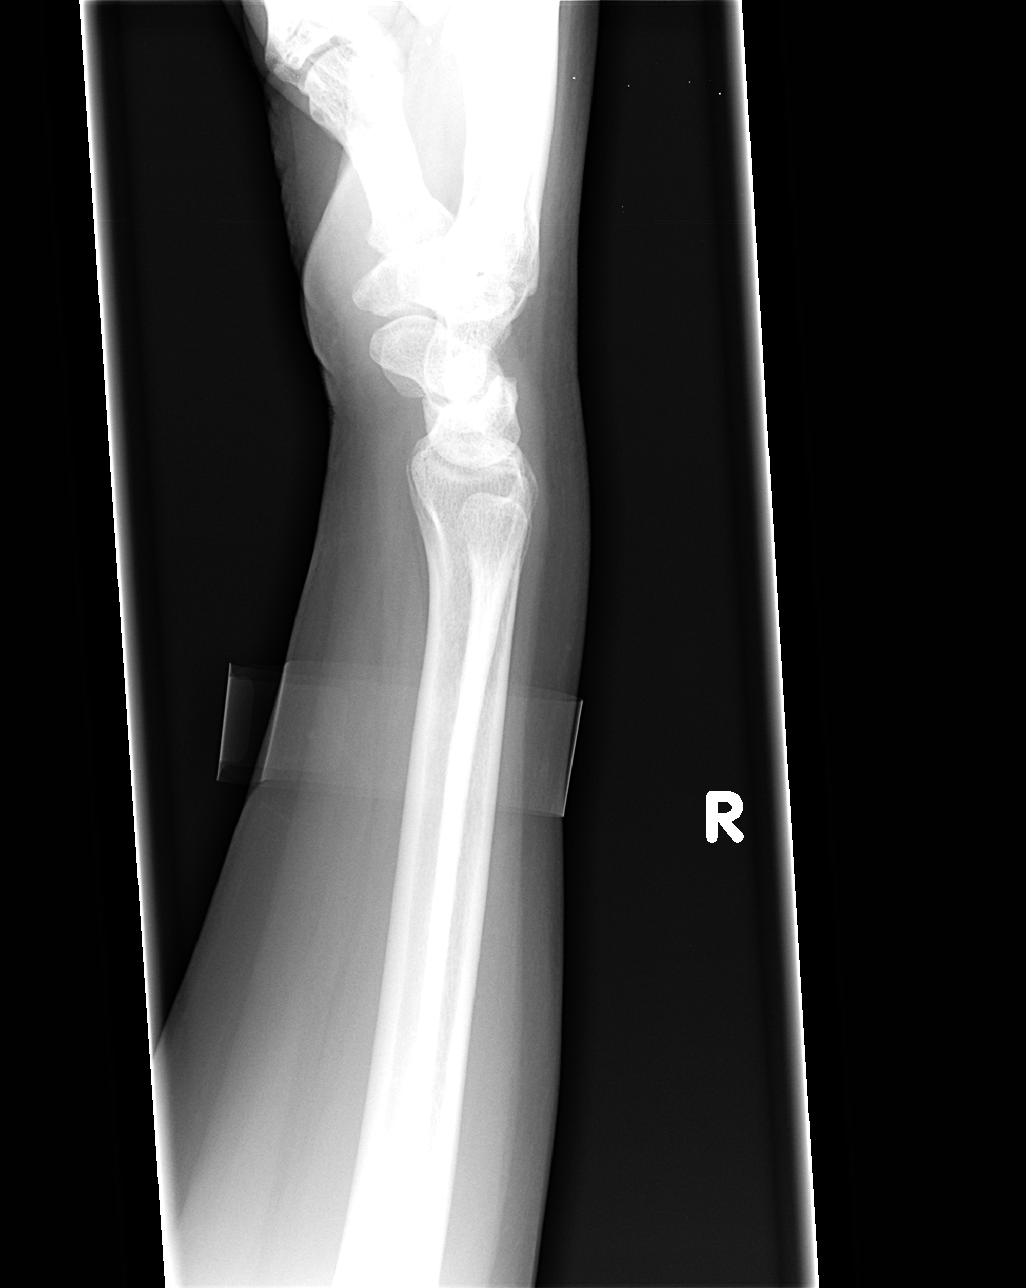

[view not recorded (4 of 4)]
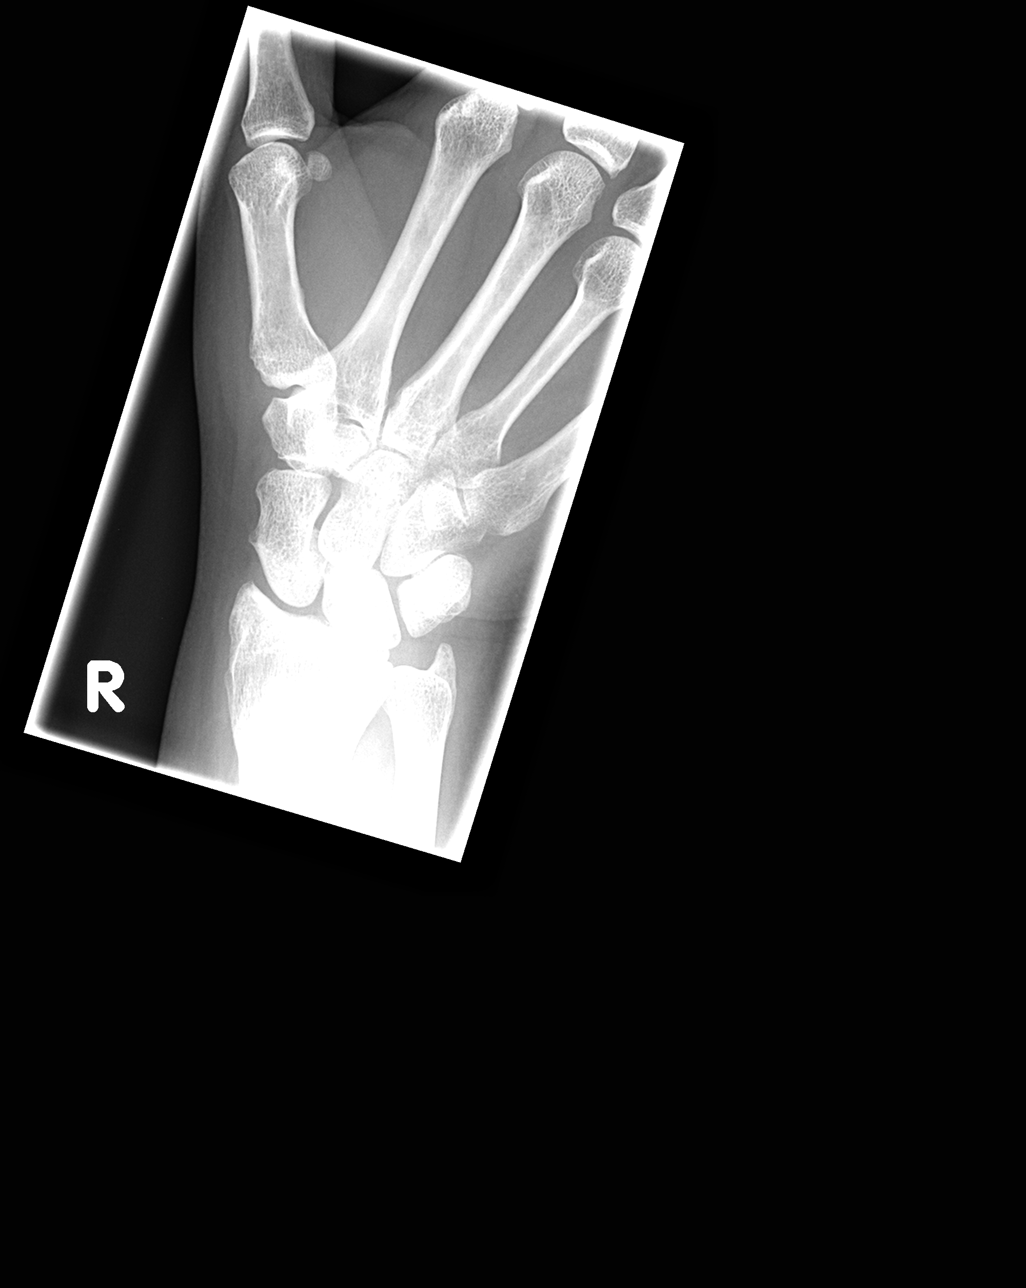

[4 of 4 positions shown; findings below may reference images not displayed]

FINDINGS: Four views of the right wrist were obtained.  Alignment of the wrist is normal.  Carpal bones appear intact.  Soft tissue structures also appear to be within normal limits.
IMPRESSION: No acute bone abnormalities of the right wrist.

## 2008-04-18 IMAGING — CT CT MAXILLOFACIAL W/O CM
1 of 2 series · 15 of 30 positions shown, 19 images · non-contrast
Comparison: 04/20/06.

CLINICAL DATA: 36 year old with vomiting and fall.
HEAD CT WITHOUT CONTRAST ? 04/21/07:
TECHNIQUE: Contiguous axial CT images were obtained from the base of the skull through the vertex according to standard protocol without contrast.
TECHNIQUE: Coronal and axial CT images were obtained through the maxillofacial region including the facial bones, orbits, and paranasal sinuses.  No intravenous contrast was administered.

[Series 5: facial 2.0 h32s · axial · 0.37mm/px · z∈[+930,+1094]mm · 15 of 92 slices shown, 19 images]
[im 5/92  brain]
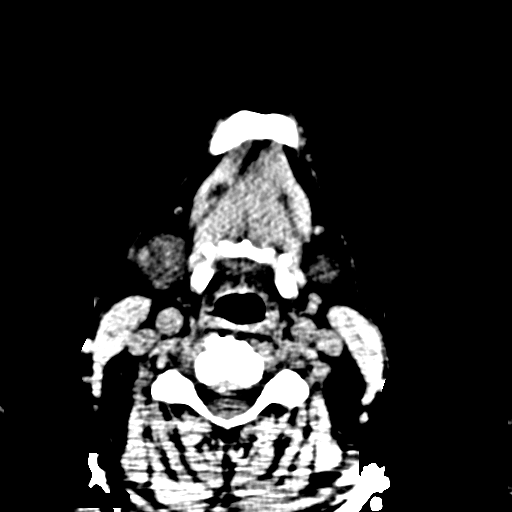
[im 5/92  bone]
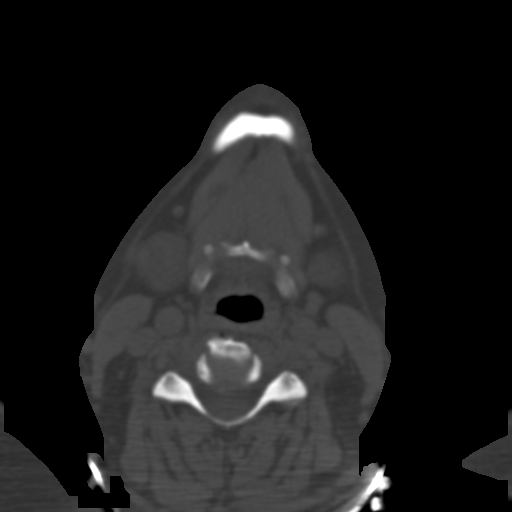
[im 13/92  bone]
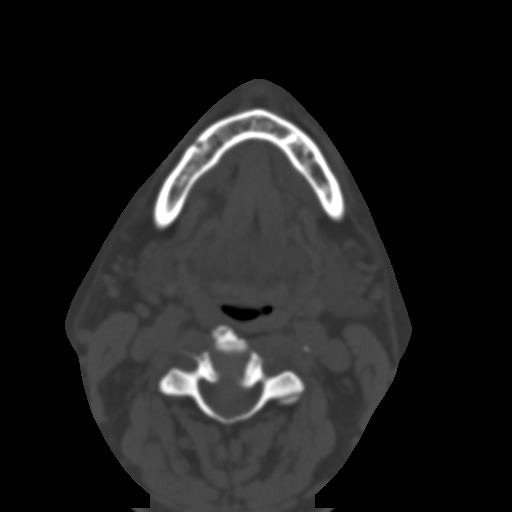
[im 17/92  bone]
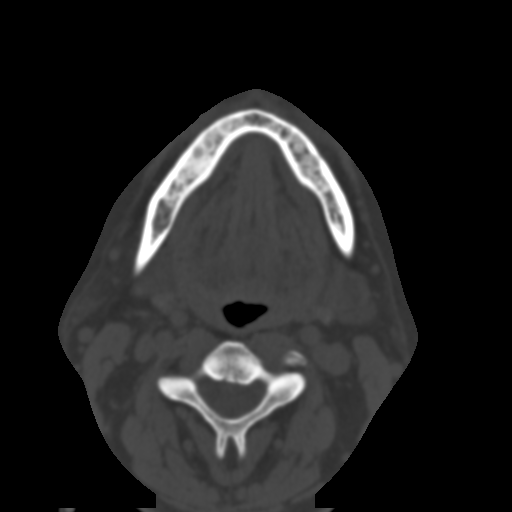
[im 21/92  bone]
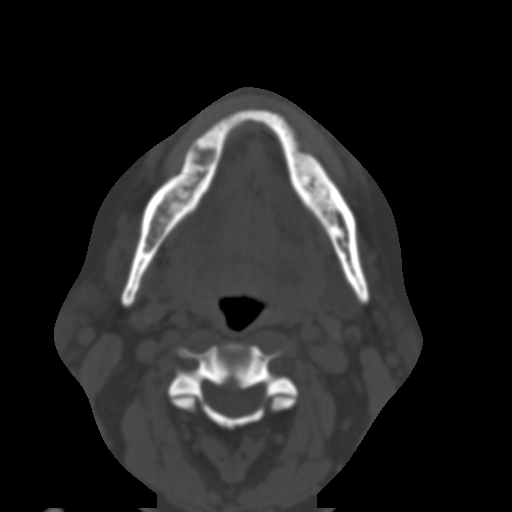
[im 29/92  brain]
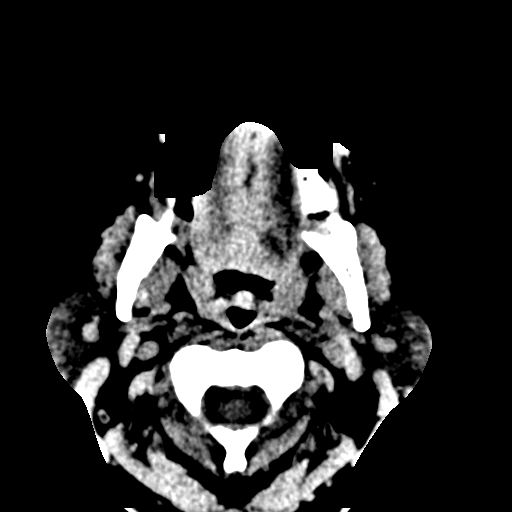
[im 29/92  bone]
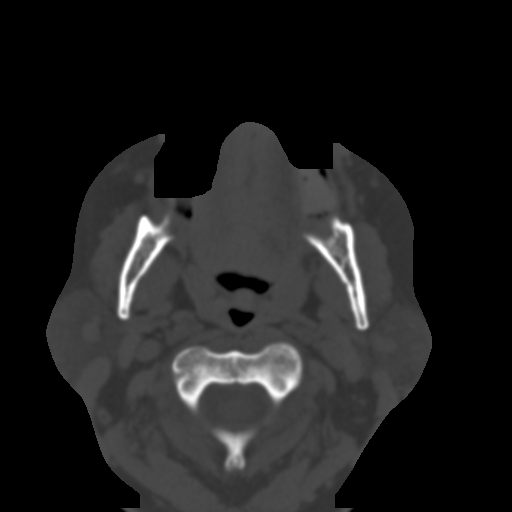
[im 34/92  bone]
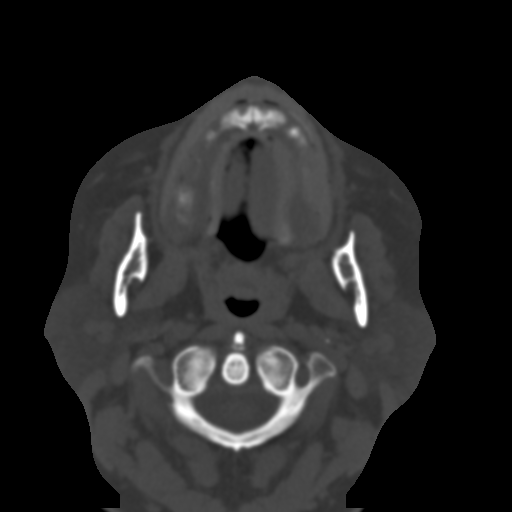
[im 42/92  bone]
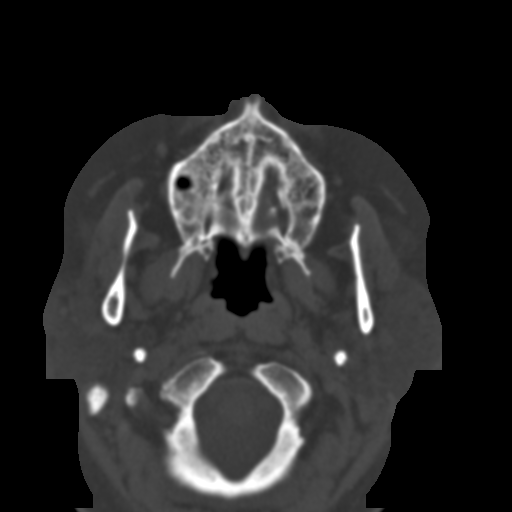
[im 46/92  bone]
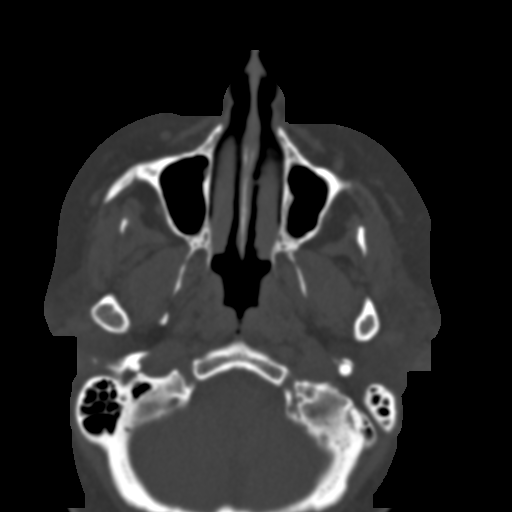
[im 50/92  brain]
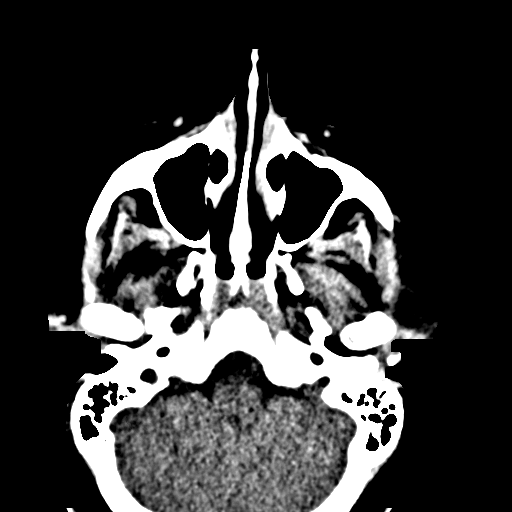
[im 50/92  bone]
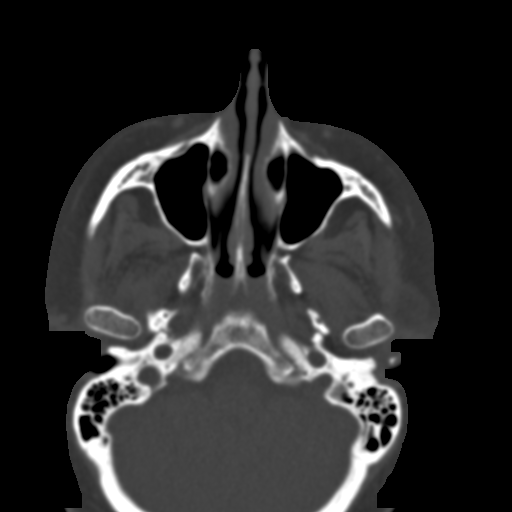
[im 58/92  bone]
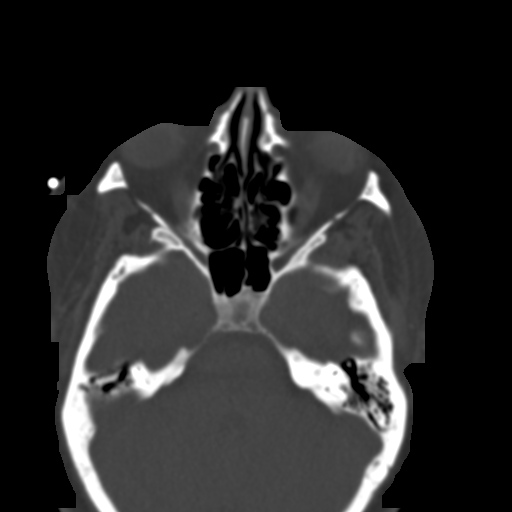
[im 63/92  bone]
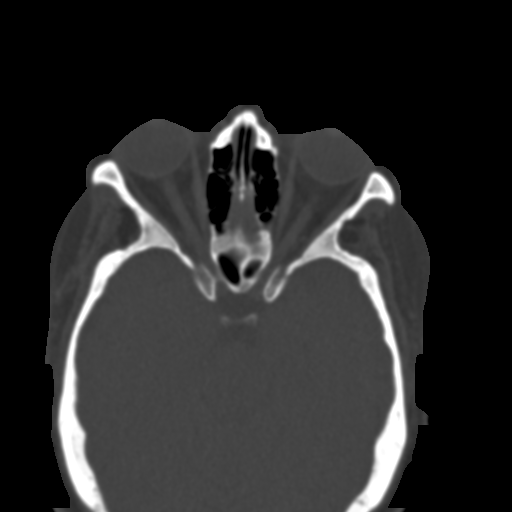
[im 71/92  bone]
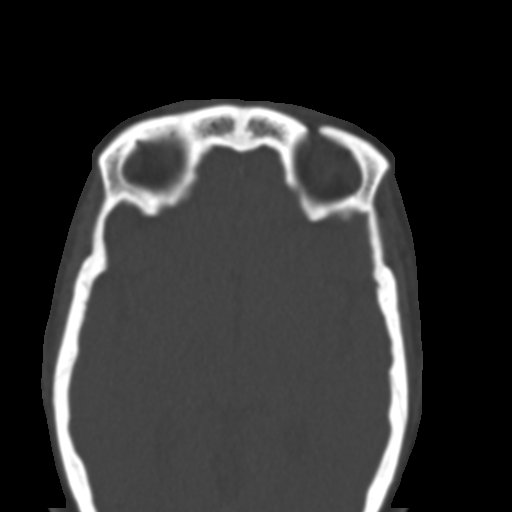
[im 75/92  brain]
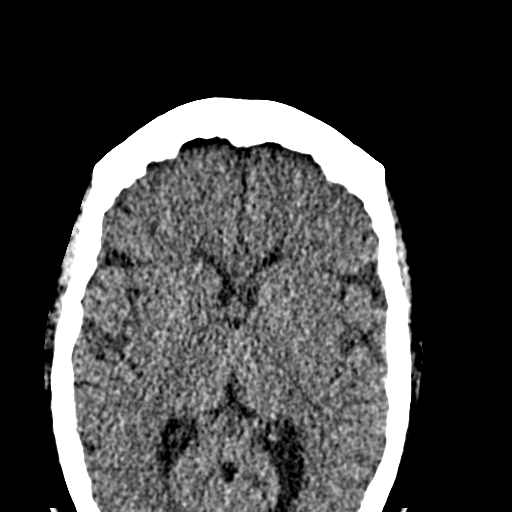
[im 75/92  bone]
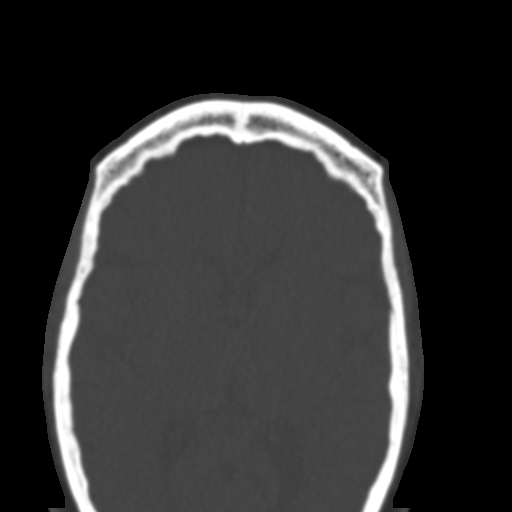
[im 79/92  bone]
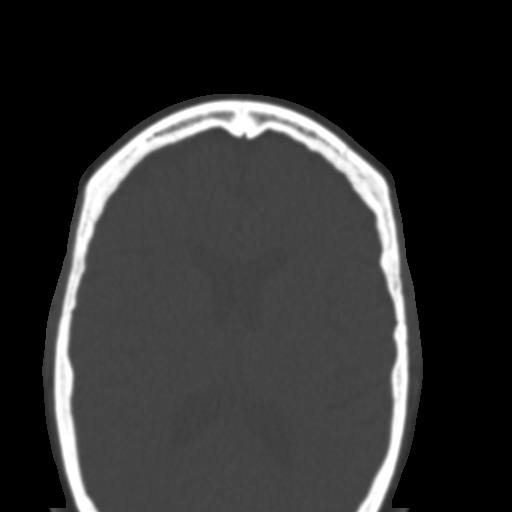
[im 87/92  bone]
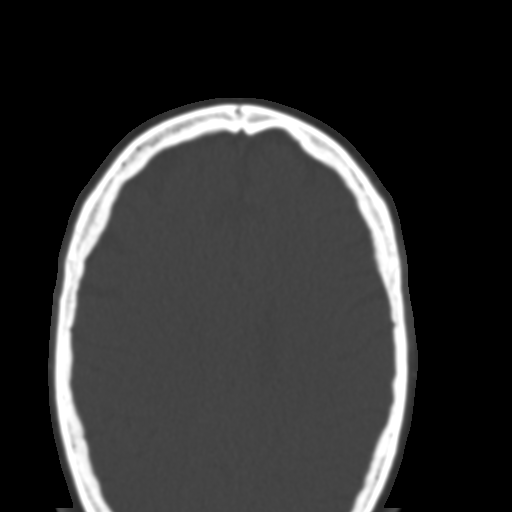

[15 of 30 positions shown; findings below may reference images not displayed]

FINDINGS: The ventricles and basilar cisterns are stable.  There is no evidence to suggest acute hemorrhage, mass lesion, midline shift, hydrocephalus, or a large infarct.  The mandibular condyles are located.  The frontal sinuses are aplastic.  The visualized paranasal sinuses are clear.  No acute bone abnormalities.
IMPRESSION: No acute intracranial abnormality. 
MAXILLOFACIAL CT WITHOUT CONTRAST ? 04/21/07:
FINDINGS: The nasal bones are intact.  There is a tiny polyp or cyst involving the right maxillary sinus but overall there is no significant paranasal sinus disease.  The frontal sinuses are aplastic.  The mastoid air cells are clear.  The mandibular condyles located.  The pterygoid plates are intact.  The mandible is intact.  The orbits are intact.  Both globes are intact.  Negative for retro-orbital hemorrhage or edema.  Visualized intracranial structures are within normal limits.  The Chio Tanaka is midline.  There are degenerative changes along the anterior aspect of the cervical spine.
IMPRESSION: No acute findings in the face.
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
## 2008-04-30 IMAGING — CT CT HEAD W/O CM
4 of 5 series · 18 of 30 positions shown, 19 images · IV contrast (agent unspecified)
Comparison: none

CLINICAL DATA: Falls. 
 HEAD CT WITHOUT CONTRAST:
TECHNIQUE: Contiguous axial images were obtained from the base of the skull through the vertex according to standard protocol without contrast.
TECHNIQUE: Axial and coronal CT imaging was performed through the maxillofacial structures.  No intravenous contrast was administered.
TECHNIQUE: Multidetector CT imaging of the cervical spine was performed.  Multiplanar   CT image reconstructions were also generated.

[Series 3: headseq 2.4 h60s · axial · 0.42mm/px · z∈[+1446,+1535]mm · 3 of 72 slices shown]
[im 18/72  brain]
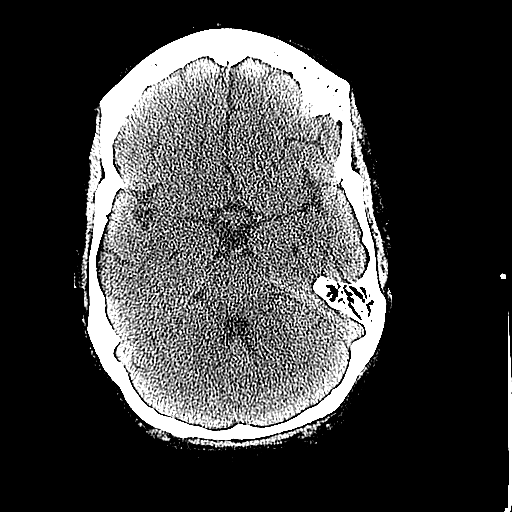
[im 36/72  brain]
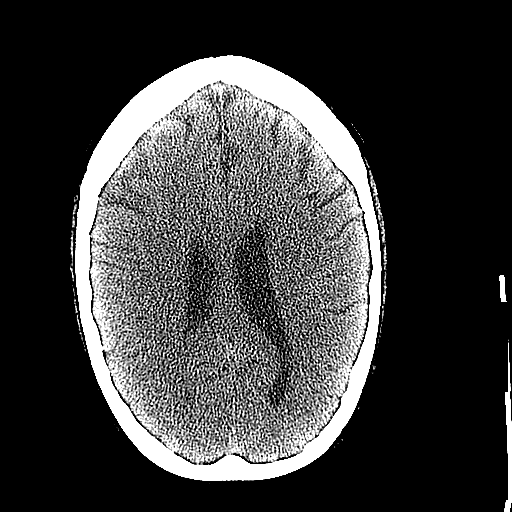
[im 54/72  brain]
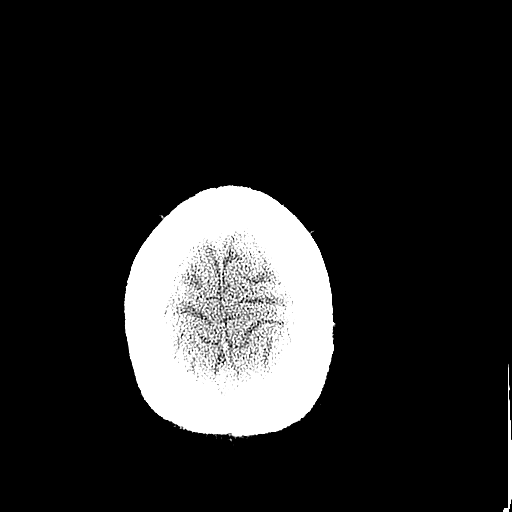

[Series 5: facial 2.0 h32s · axial · 0.35mm/px · z∈[+1320,+1428]mm · 4 of 91 slices shown, 5 images]
[im 19/91  brain]
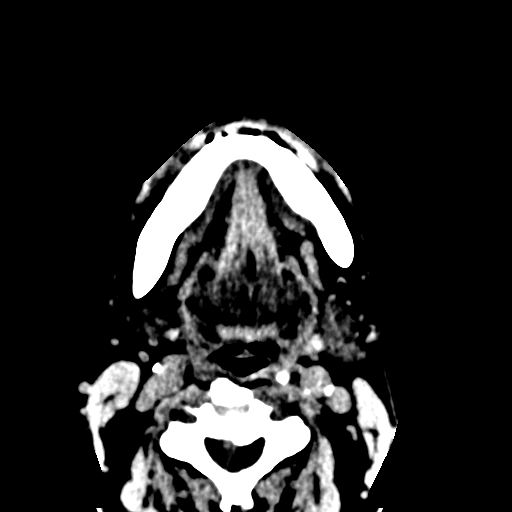
[im 19/91  bone]
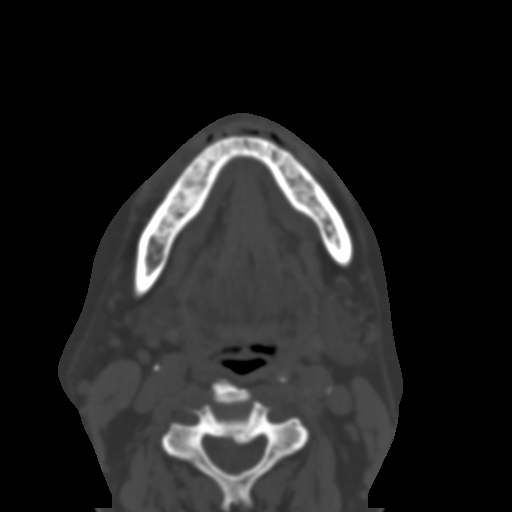
[im 37/91  brain]
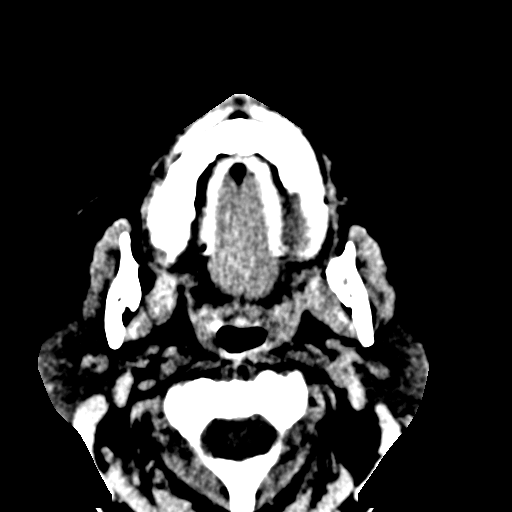
[im 55/91  brain]
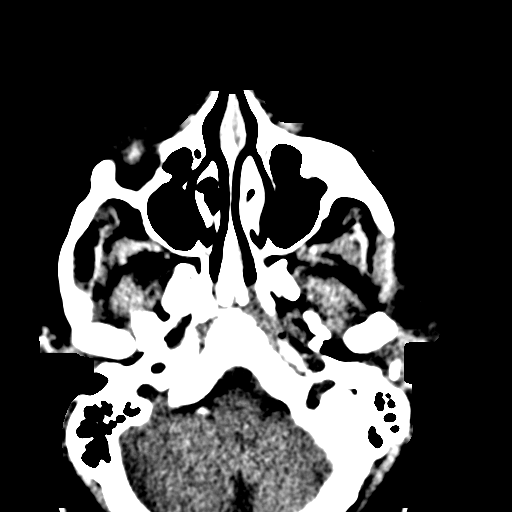
[im 73/91  brain]
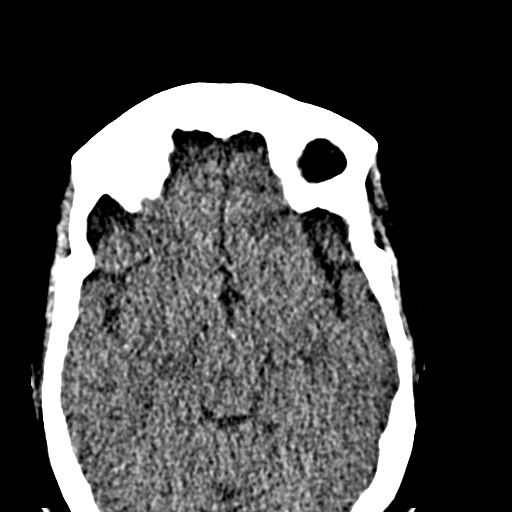

[Series 9: cervical 2.0 b31s · axial · 0.38mm/px · z∈[+1249,+1357]mm · 5 of 126 slices shown]
[im 18/126  brain]
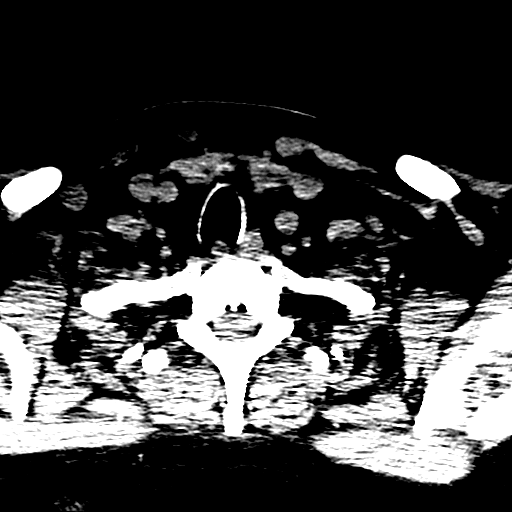
[im 36/126  brain]
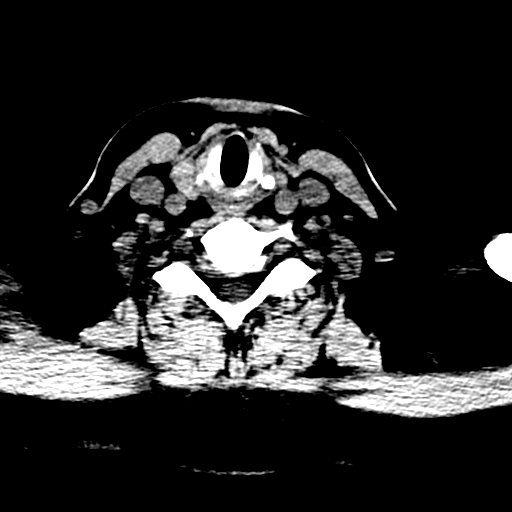
[im 54/126  brain]
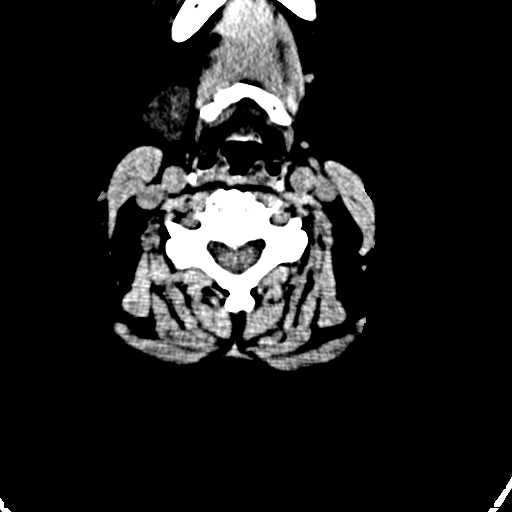
[im 72/126  brain]
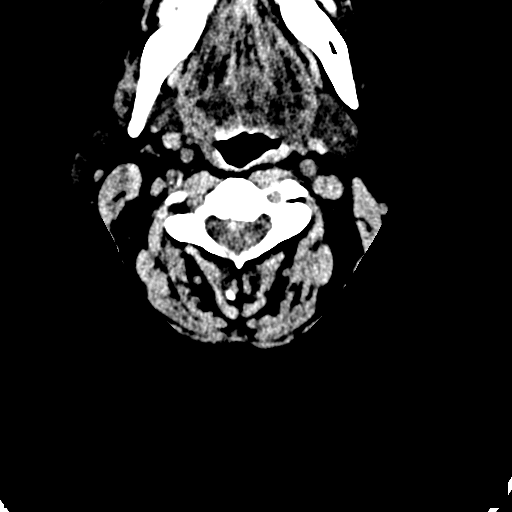
[im 90/126  brain]
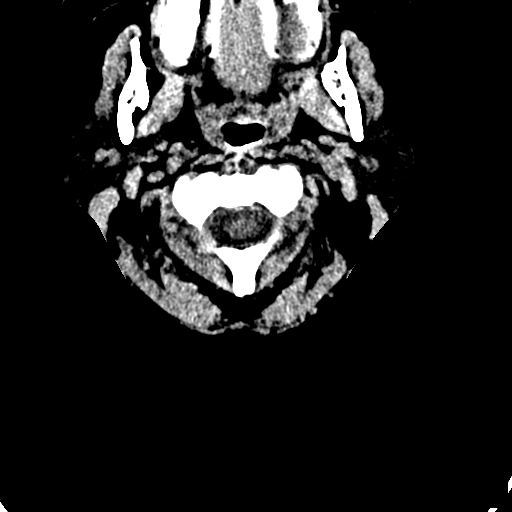

[Series 13: cervical 2.0 spo · axial · 0.21mm/px · z∈[+1224,+1355]mm · 6 of 126 slices shown]
[im 18/126  brain]
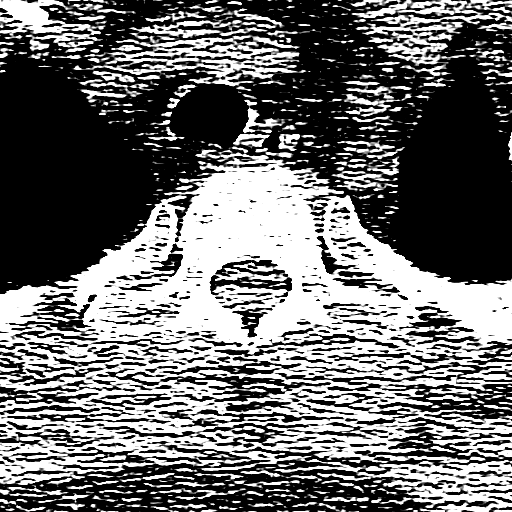
[im 36/126  brain]
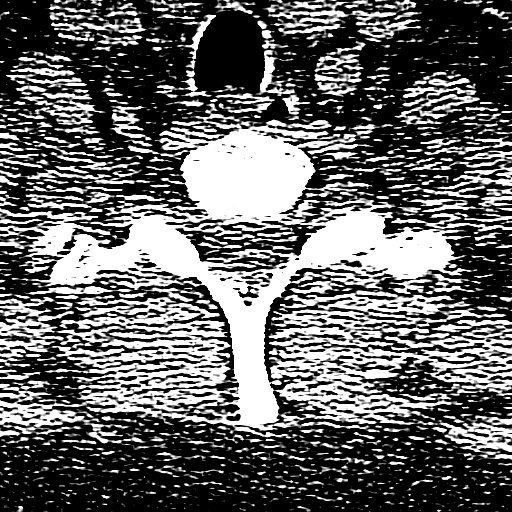
[im 54/126  brain]
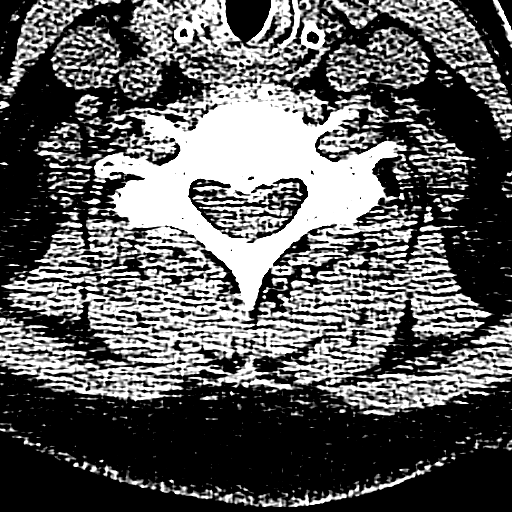
[im 72/126  brain]
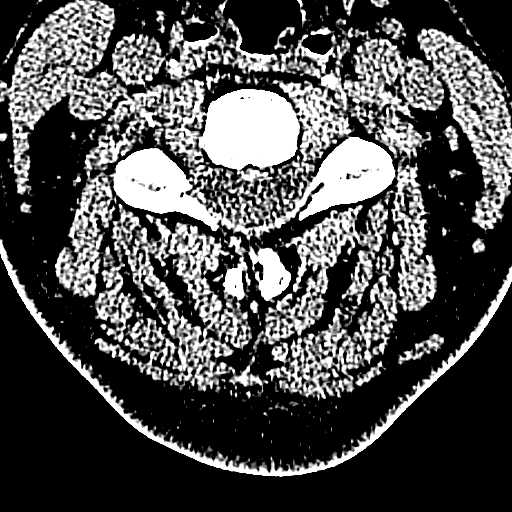
[im 90/126  brain]
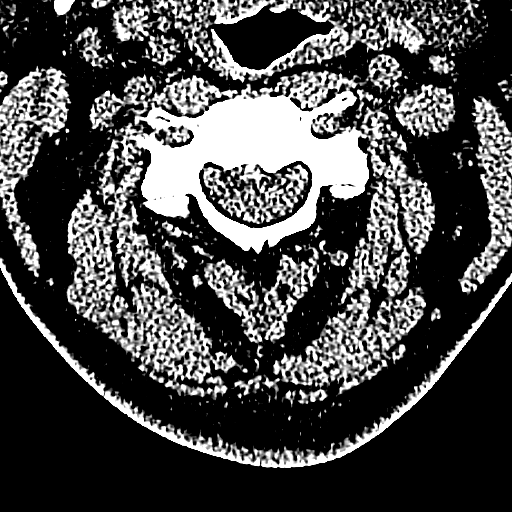
[im 108/126  brain]
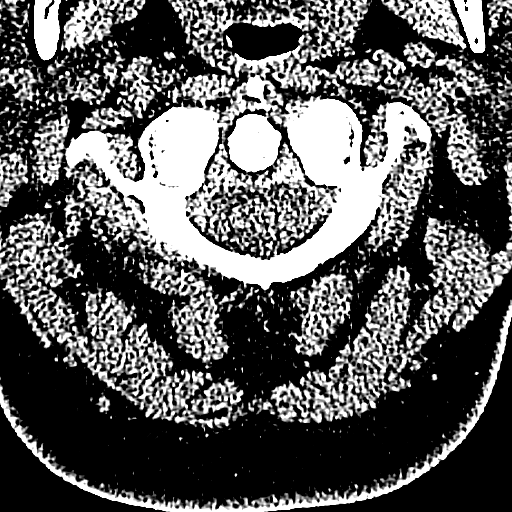

[18 of 30 positions shown; findings below may reference images not displayed]

FINDINGS: There is no evidence of intracranial hemorrhage, brain edema, acute infarct, mass lesion, or mass effect.  No other intra-axial abnormalities are seen, and the ventricles are within normal limits.  No abnormal extra-axial fluid collections or masses are identified.  No skull abnormalities are noted.
IMPRESSION: Negative non-contrast head CT.
 MAXILLOFACIAL CT WITHOUT CONTRAST:
FINDINGS: The patient is edentulous.  
 No fractures are identified.  
 The orbits are intact.  
 The paranasal sinuses are all normally aerated.
IMPRESSION: No acute facial bone injury. 
 CERVICAL SPINE   CT WITHOUT CONTRAST:
FINDINGS: The alignment of the cervical spine is within normal limits.  
 The vertebral body heights and the disc spaces appear well preserved. 
 The prevertebral soft tissue space is unremarkable.  The facet joints are all well aligned.  
 No cervical spine fractures are identified.  There is mild anterior spurring at C-3, C-4, and C-5 consistent with degenerative disc disease.
IMPRESSION: 1.  No evidence for cervical spine fracture. 
 2.  Mild cervical spondylosis.

## 2008-05-01 ENCOUNTER — Encounter: Payer: Self-pay | Admitting: Cardiology

## 2008-05-01 ENCOUNTER — Emergency Department (HOSPITAL_COMMUNITY): Admission: EM | Admit: 2008-05-01 | Discharge: 2008-05-01 | Payer: Self-pay | Admitting: Emergency Medicine

## 2008-05-07 ENCOUNTER — Encounter (HOSPITAL_COMMUNITY): Admission: RE | Admit: 2008-05-07 | Discharge: 2008-06-06 | Payer: Self-pay | Admitting: Family Medicine

## 2008-05-11 ENCOUNTER — Ambulatory Visit: Payer: Self-pay | Admitting: Cardiology

## 2008-05-20 ENCOUNTER — Ambulatory Visit: Payer: Self-pay | Admitting: Cardiology

## 2008-05-26 ENCOUNTER — Encounter: Payer: Self-pay | Admitting: Cardiology

## 2008-05-26 ENCOUNTER — Emergency Department (HOSPITAL_COMMUNITY): Admission: EM | Admit: 2008-05-26 | Discharge: 2008-05-26 | Payer: Self-pay | Admitting: Emergency Medicine

## 2008-06-03 ENCOUNTER — Encounter: Payer: Self-pay | Admitting: Orthopedic Surgery

## 2008-06-14 ENCOUNTER — Inpatient Hospital Stay (HOSPITAL_COMMUNITY): Admission: EM | Admit: 2008-06-14 | Discharge: 2008-06-16 | Payer: Self-pay | Admitting: Emergency Medicine

## 2008-06-14 ENCOUNTER — Encounter: Payer: Self-pay | Admitting: Cardiology

## 2008-06-23 ENCOUNTER — Encounter: Payer: Self-pay | Admitting: Orthopedic Surgery

## 2008-08-11 ENCOUNTER — Encounter (HOSPITAL_COMMUNITY): Admission: RE | Admit: 2008-08-11 | Discharge: 2008-09-10 | Payer: Self-pay | Admitting: Family Medicine

## 2008-08-11 ENCOUNTER — Ambulatory Visit (HOSPITAL_COMMUNITY): Payer: Self-pay | Admitting: Family Medicine

## 2008-09-14 ENCOUNTER — Ambulatory Visit: Payer: Self-pay | Admitting: Cardiology

## 2008-10-15 ENCOUNTER — Ambulatory Visit (HOSPITAL_COMMUNITY): Payer: Self-pay | Admitting: Family Medicine

## 2008-10-15 ENCOUNTER — Encounter (HOSPITAL_COMMUNITY): Admission: RE | Admit: 2008-10-15 | Discharge: 2008-11-14 | Payer: Self-pay | Admitting: Family Medicine

## 2008-10-16 ENCOUNTER — Inpatient Hospital Stay (HOSPITAL_COMMUNITY): Admission: EM | Admit: 2008-10-16 | Discharge: 2008-10-26 | Payer: Self-pay | Admitting: Emergency Medicine

## 2008-10-16 ENCOUNTER — Ambulatory Visit: Payer: Self-pay | Admitting: Cardiology

## 2008-11-11 ENCOUNTER — Ambulatory Visit (HOSPITAL_COMMUNITY): Payer: Self-pay | Admitting: Family Medicine

## 2008-11-18 ENCOUNTER — Ambulatory Visit (HOSPITAL_COMMUNITY): Admission: RE | Admit: 2008-11-18 | Discharge: 2008-11-18 | Payer: Self-pay | Admitting: Family Medicine

## 2008-12-03 ENCOUNTER — Encounter (HOSPITAL_COMMUNITY): Admission: RE | Admit: 2008-12-03 | Discharge: 2009-01-02 | Payer: Self-pay | Admitting: Family Medicine

## 2008-12-13 ENCOUNTER — Encounter (HOSPITAL_COMMUNITY): Admission: RE | Admit: 2008-12-13 | Discharge: 2009-01-12 | Payer: Self-pay | Admitting: Nephrology

## 2008-12-13 ENCOUNTER — Ambulatory Visit (HOSPITAL_COMMUNITY): Payer: Self-pay | Admitting: Nephrology

## 2008-12-23 ENCOUNTER — Emergency Department (HOSPITAL_COMMUNITY): Admission: EM | Admit: 2008-12-23 | Discharge: 2008-12-24 | Payer: Self-pay | Admitting: Emergency Medicine

## 2009-01-07 ENCOUNTER — Emergency Department (HOSPITAL_COMMUNITY): Admission: EM | Admit: 2009-01-07 | Discharge: 2009-01-07 | Payer: Self-pay | Admitting: Emergency Medicine

## 2009-01-13 ENCOUNTER — Encounter (HOSPITAL_COMMUNITY): Admission: RE | Admit: 2009-01-13 | Discharge: 2009-02-12 | Payer: Self-pay | Admitting: Nephrology

## 2009-01-20 ENCOUNTER — Encounter: Admission: RE | Admit: 2009-01-20 | Discharge: 2009-01-20 | Payer: Self-pay | Admitting: Neurosurgery

## 2009-02-17 ENCOUNTER — Inpatient Hospital Stay (HOSPITAL_COMMUNITY): Admission: EM | Admit: 2009-02-17 | Discharge: 2009-03-10 | Payer: Self-pay | Admitting: Emergency Medicine

## 2009-03-02 ENCOUNTER — Ambulatory Visit: Payer: Self-pay | Admitting: Internal Medicine

## 2009-03-03 ENCOUNTER — Ambulatory Visit: Payer: Self-pay | Admitting: Internal Medicine

## 2009-03-04 ENCOUNTER — Encounter: Payer: Self-pay | Admitting: Internal Medicine

## 2009-03-07 ENCOUNTER — Ambulatory Visit: Payer: Self-pay | Admitting: Gastroenterology

## 2009-03-08 ENCOUNTER — Ambulatory Visit: Payer: Self-pay | Admitting: Oncology

## 2009-03-15 ENCOUNTER — Encounter: Payer: Self-pay | Admitting: Cardiology

## 2009-03-16 IMAGING — CR DG CHEST 2V
2 series · 2 of 2 positions shown · non-contrast
Comparison: 09/08/2006

CLINICAL DATA: Chest pain.  Cough.

CHEST - 2 VIEW

[view not recorded (1 of 2)]
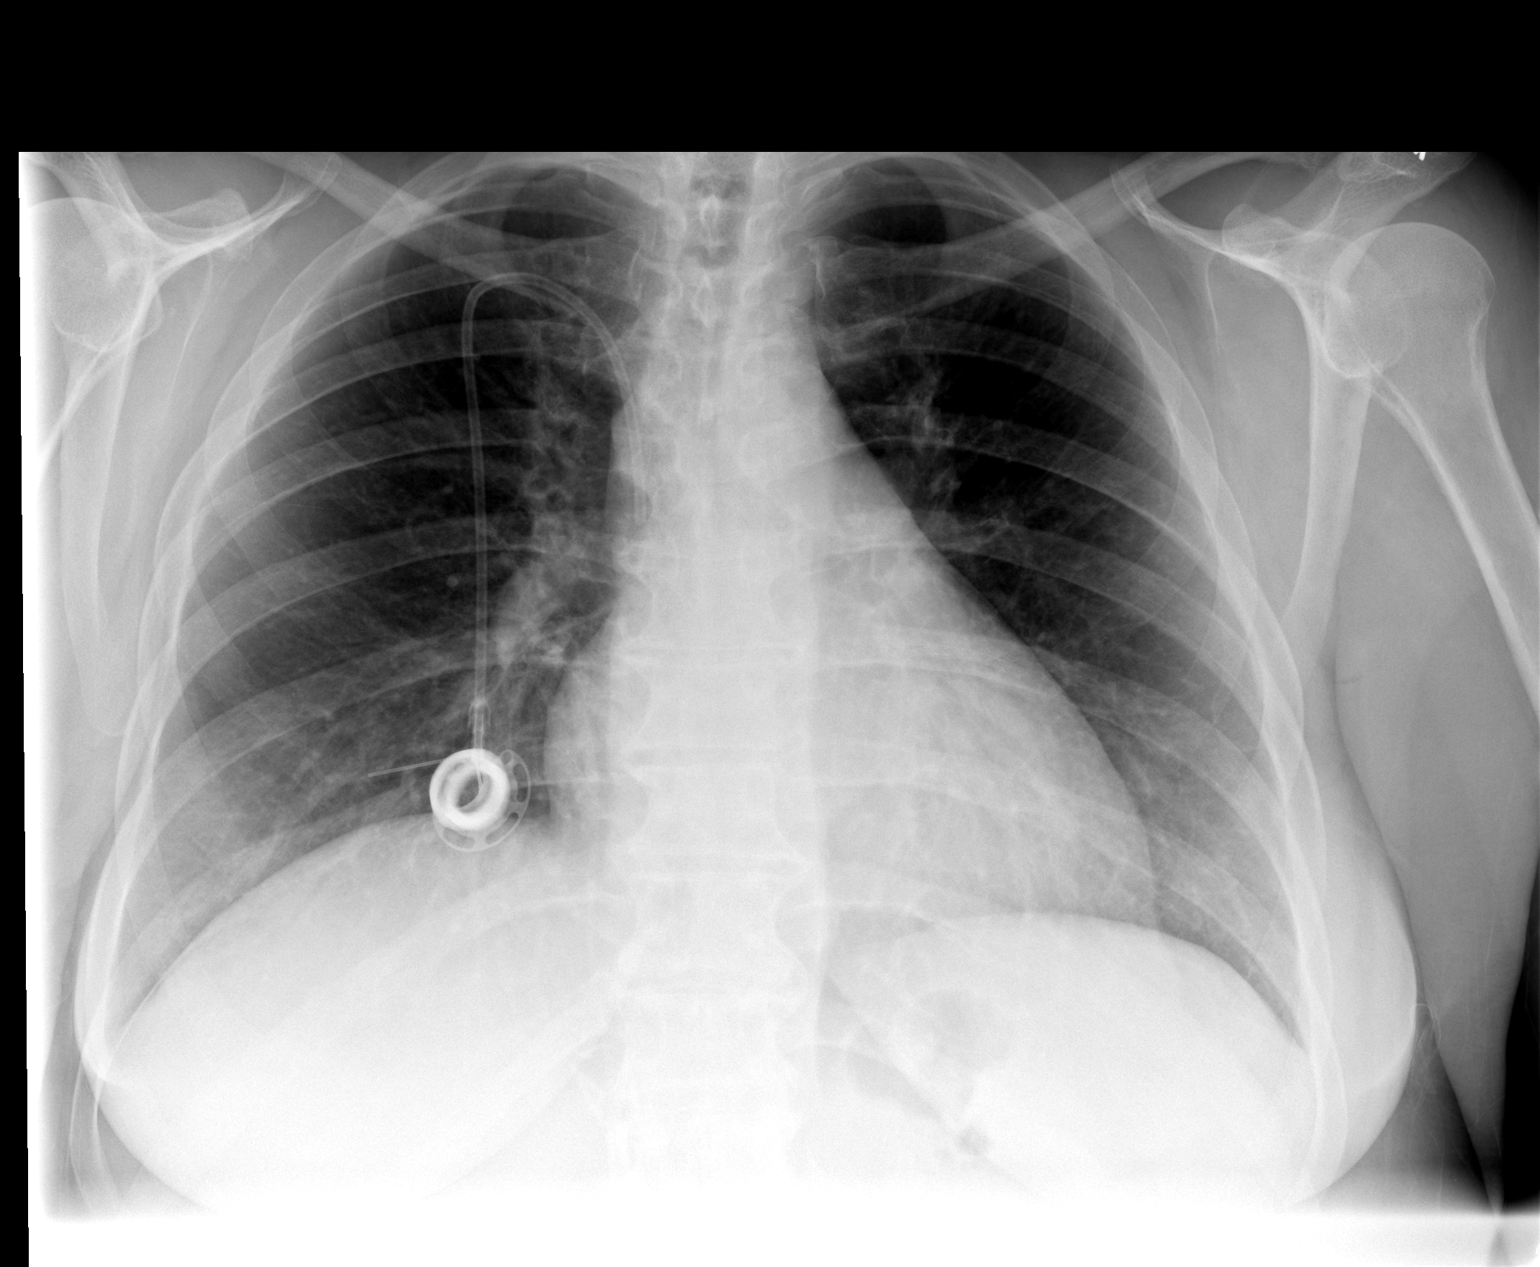

[view not recorded (2 of 2)]
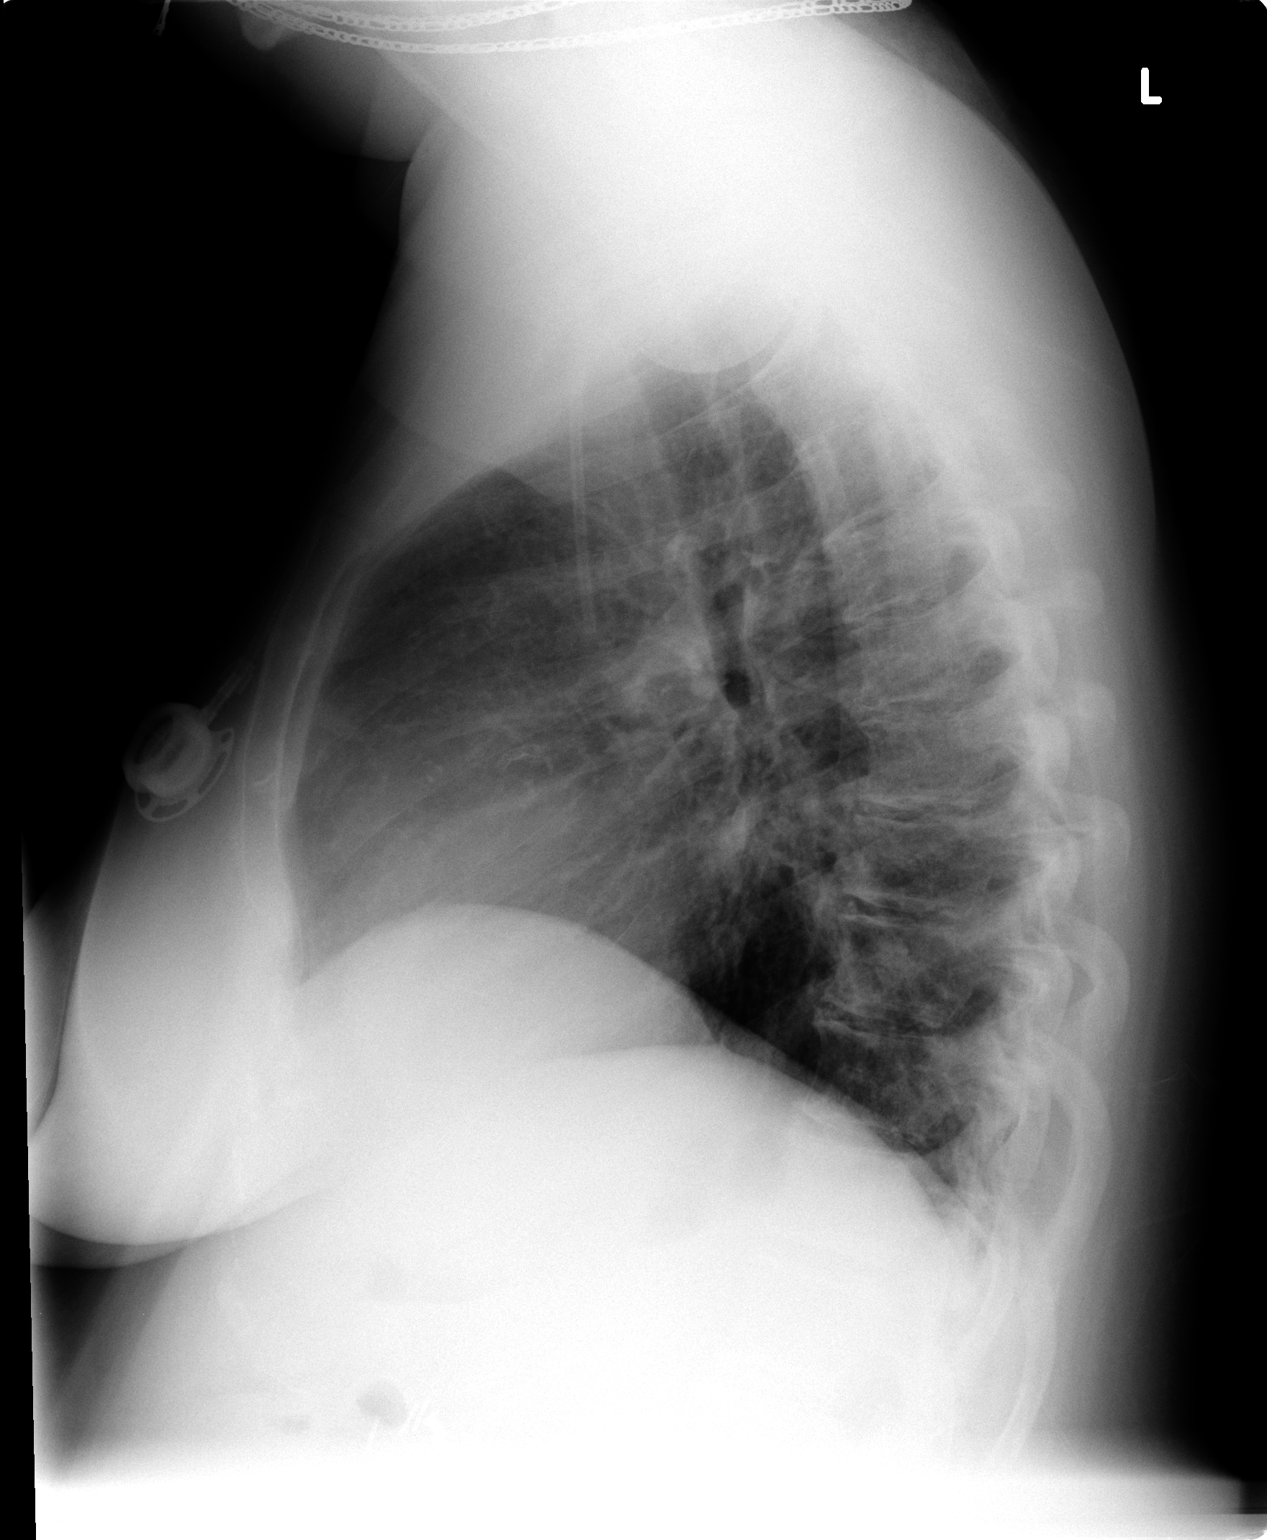

[2 of 2 positions shown; findings below may reference images not displayed]

FINDINGS: The heart is mildly enlarged.  There is no heart failure.
No edema or infiltrate.  Negative for effusion.  Port-A-Cath tip is
in the SVC. There is coronary artery calcification.
IMPRESSION: No active cardiopulmonary disease.

## 2009-03-18 ENCOUNTER — Encounter: Payer: Self-pay | Admitting: Cardiology

## 2009-03-21 ENCOUNTER — Emergency Department (HOSPITAL_COMMUNITY): Admission: EM | Admit: 2009-03-21 | Discharge: 2009-03-21 | Payer: Self-pay | Admitting: Emergency Medicine

## 2009-03-23 ENCOUNTER — Ambulatory Visit (HOSPITAL_COMMUNITY): Admission: RE | Admit: 2009-03-23 | Discharge: 2009-03-23 | Payer: Self-pay | Admitting: Family Medicine

## 2009-03-24 ENCOUNTER — Inpatient Hospital Stay (HOSPITAL_COMMUNITY): Admission: AD | Admit: 2009-03-24 | Discharge: 2009-03-31 | Payer: Self-pay | Admitting: Family Medicine

## 2009-03-24 ENCOUNTER — Encounter: Payer: Self-pay | Admitting: Cardiology

## 2009-04-13 ENCOUNTER — Emergency Department (HOSPITAL_COMMUNITY): Admission: EM | Admit: 2009-04-13 | Discharge: 2009-04-13 | Payer: Self-pay | Admitting: Emergency Medicine

## 2009-04-23 ENCOUNTER — Inpatient Hospital Stay (HOSPITAL_COMMUNITY): Admission: EM | Admit: 2009-04-23 | Discharge: 2009-04-29 | Payer: Self-pay | Admitting: Emergency Medicine

## 2009-04-26 ENCOUNTER — Ambulatory Visit: Payer: Self-pay | Admitting: Oncology

## 2009-04-29 IMAGING — CR DG ABDOMEN ACUTE W/ 1V CHEST
3 series · 3 of 3 positions shown · non-contrast
Comparison: 09/22/2006

CLINICAL DATA: Abdominal pain.  Nausea and vomiting.

ACUTE ABDOMEN SERIES (ABDOMEN 2 VIEW & CHEST 1 VIEW)

[view not recorded (1 of 3)]
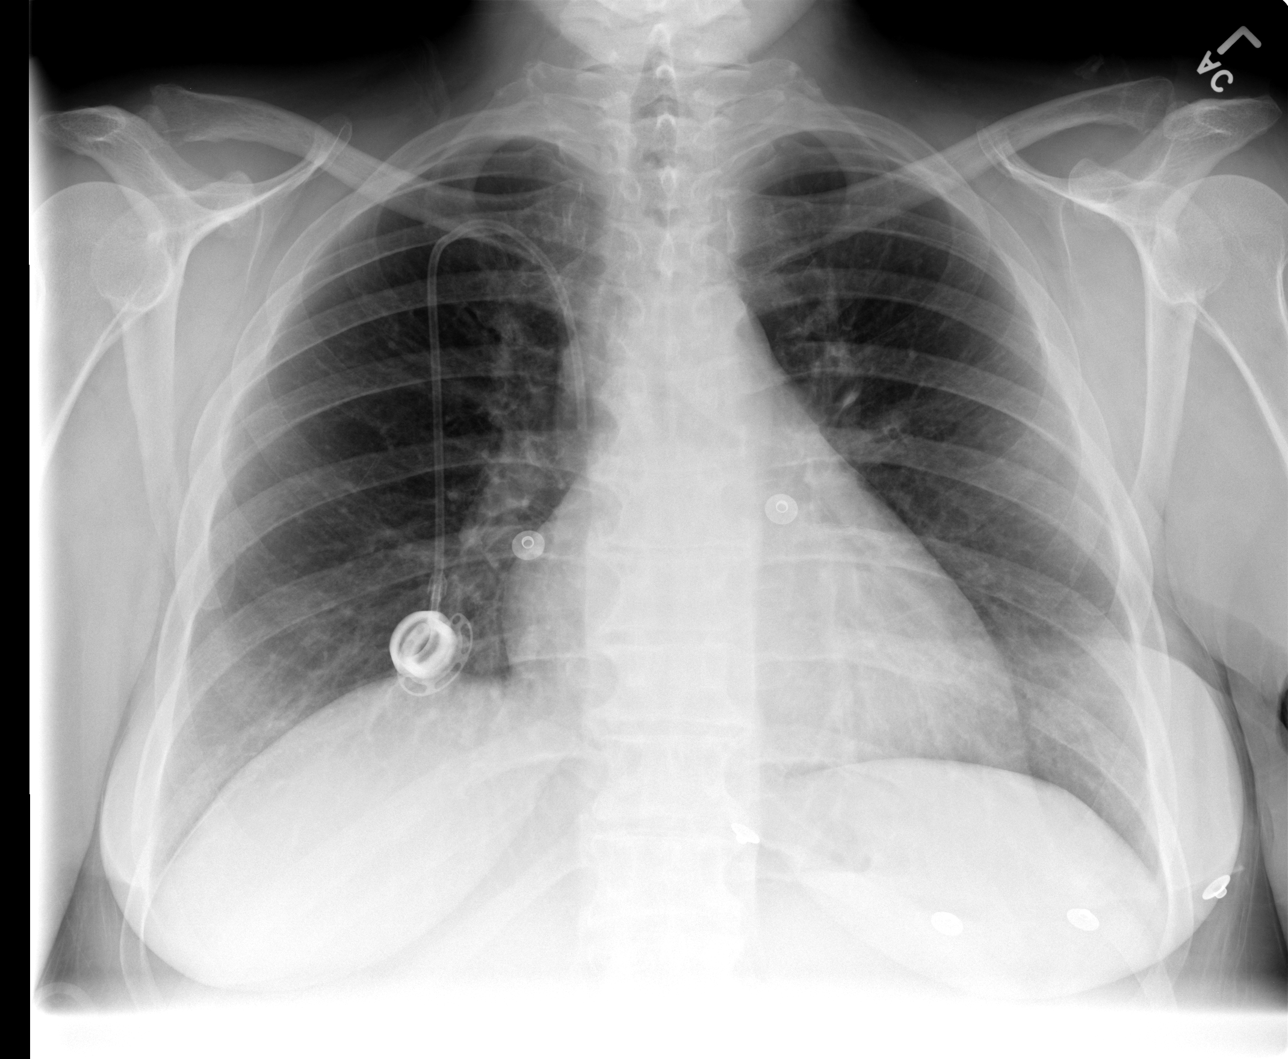

[view not recorded (2 of 3)]
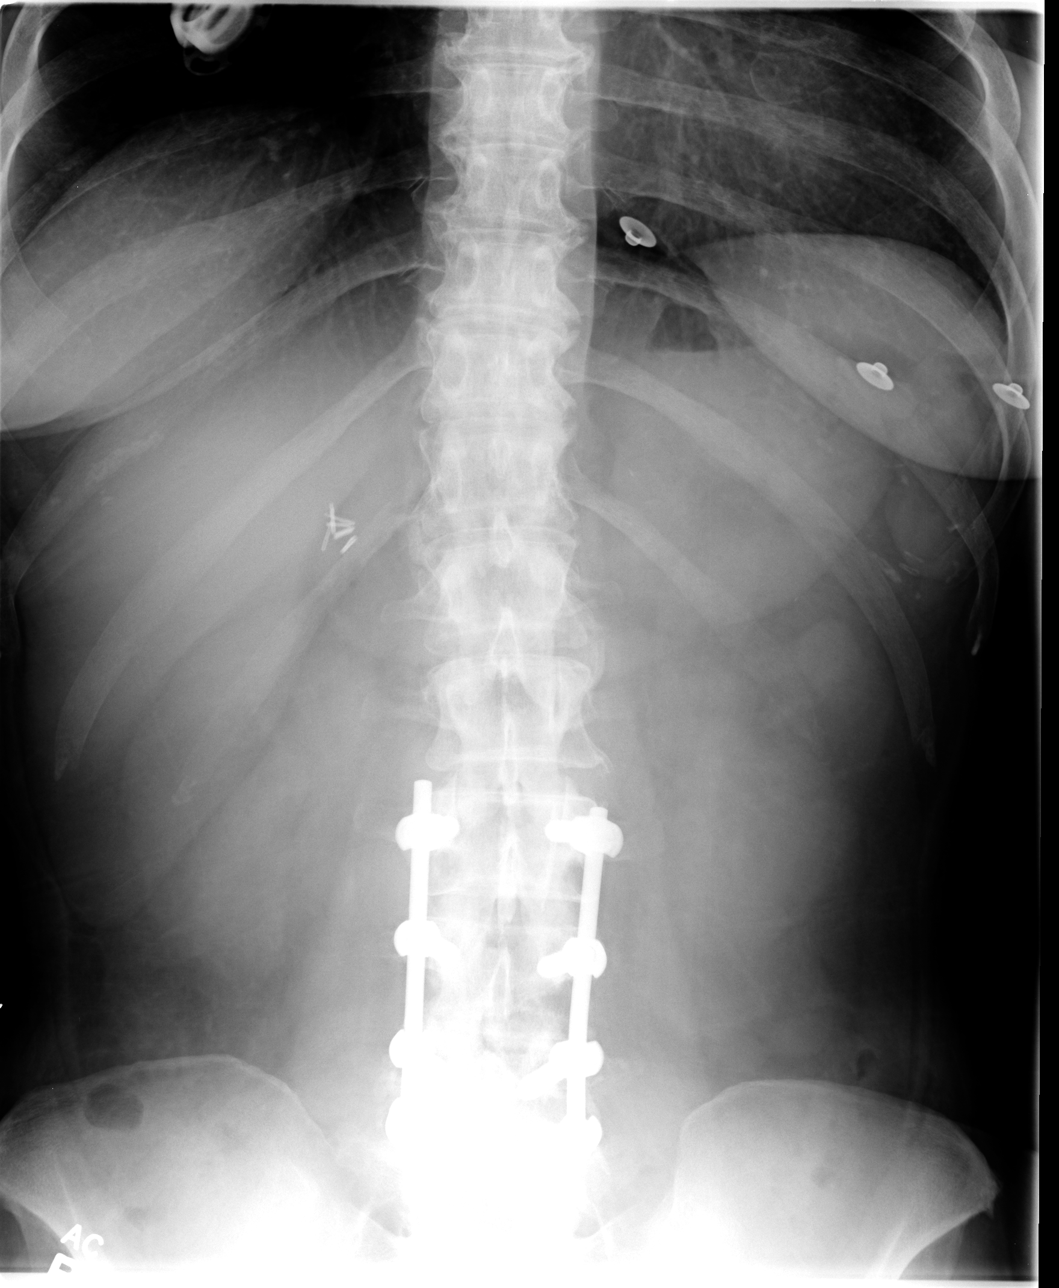

[view not recorded (3 of 3)]
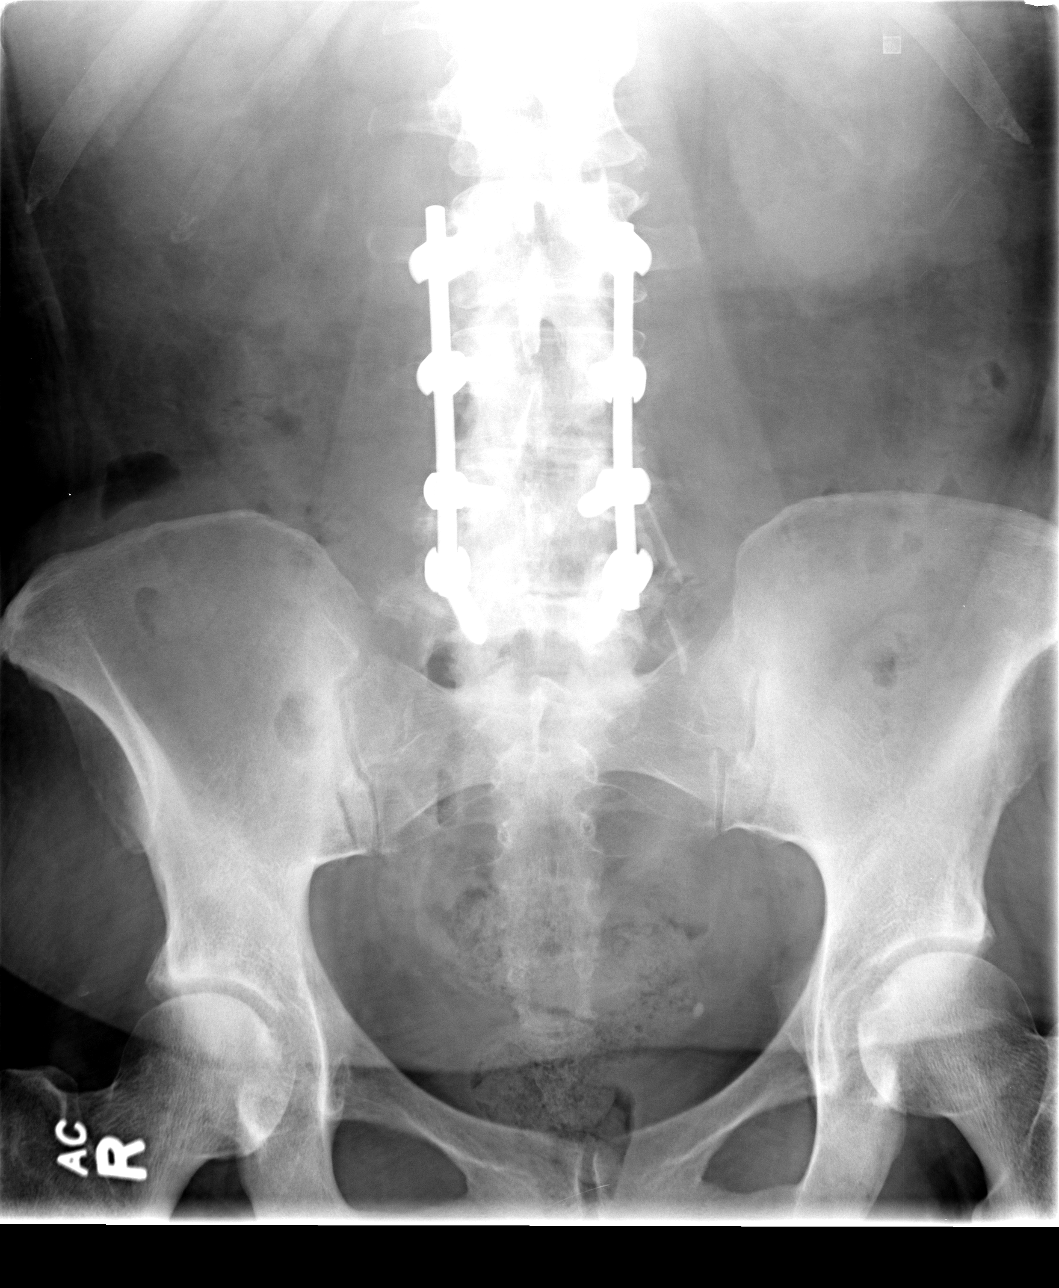

[3 of 3 positions shown; findings below may reference images not displayed]

FINDINGS: Bowel gas pattern does not show any evidence of ileus,
obstruction or free air.  There are surgical clips in the right
upper quadrant post cholecystectomy.  There has been spinal fusion.
No acute bony finding.

One-view chest shows the heart to be at the upper limits of normal
in size.  A Port-A-Cath is in place with its tip in the SVC above
the right atrium.  The lungs are clear.  No free air seen under the
diaphragm.
IMPRESSION: No acute finding.  Extensive prior lumbar fusion.  Port-A-Cath in
place.

## 2009-05-05 ENCOUNTER — Emergency Department (HOSPITAL_COMMUNITY): Admission: EM | Admit: 2009-05-05 | Discharge: 2009-05-06 | Payer: Self-pay | Admitting: Emergency Medicine

## 2009-05-06 ENCOUNTER — Emergency Department (HOSPITAL_COMMUNITY): Admission: EM | Admit: 2009-05-06 | Discharge: 2009-05-06 | Payer: Self-pay | Admitting: Emergency Medicine

## 2009-05-09 ENCOUNTER — Encounter: Payer: Self-pay | Admitting: Gastroenterology

## 2009-05-16 DIAGNOSIS — I10 Essential (primary) hypertension: Secondary | ICD-10-CM | POA: Insufficient documentation

## 2009-05-16 DIAGNOSIS — I2510 Atherosclerotic heart disease of native coronary artery without angina pectoris: Secondary | ICD-10-CM

## 2009-05-16 DIAGNOSIS — R609 Edema, unspecified: Secondary | ICD-10-CM

## 2009-05-20 ENCOUNTER — Ambulatory Visit (HOSPITAL_COMMUNITY): Payer: Self-pay | Admitting: Nephrology

## 2009-05-20 ENCOUNTER — Encounter (HOSPITAL_COMMUNITY): Admission: RE | Admit: 2009-05-20 | Discharge: 2009-05-25 | Payer: Self-pay | Admitting: Nephrology

## 2009-05-20 ENCOUNTER — Ambulatory Visit (HOSPITAL_COMMUNITY): Payer: Self-pay | Admitting: Oncology

## 2009-05-24 IMAGING — CR DG CHEST 2V
2 series · 2 of 2 positions shown · non-contrast
Comparison: 03/18/2008

CLINICAL DATA: Chest pain and dizziness.  Cough.  Nausea vomiting.

CHEST - 2 VIEW

[view not recorded (1 of 2)]
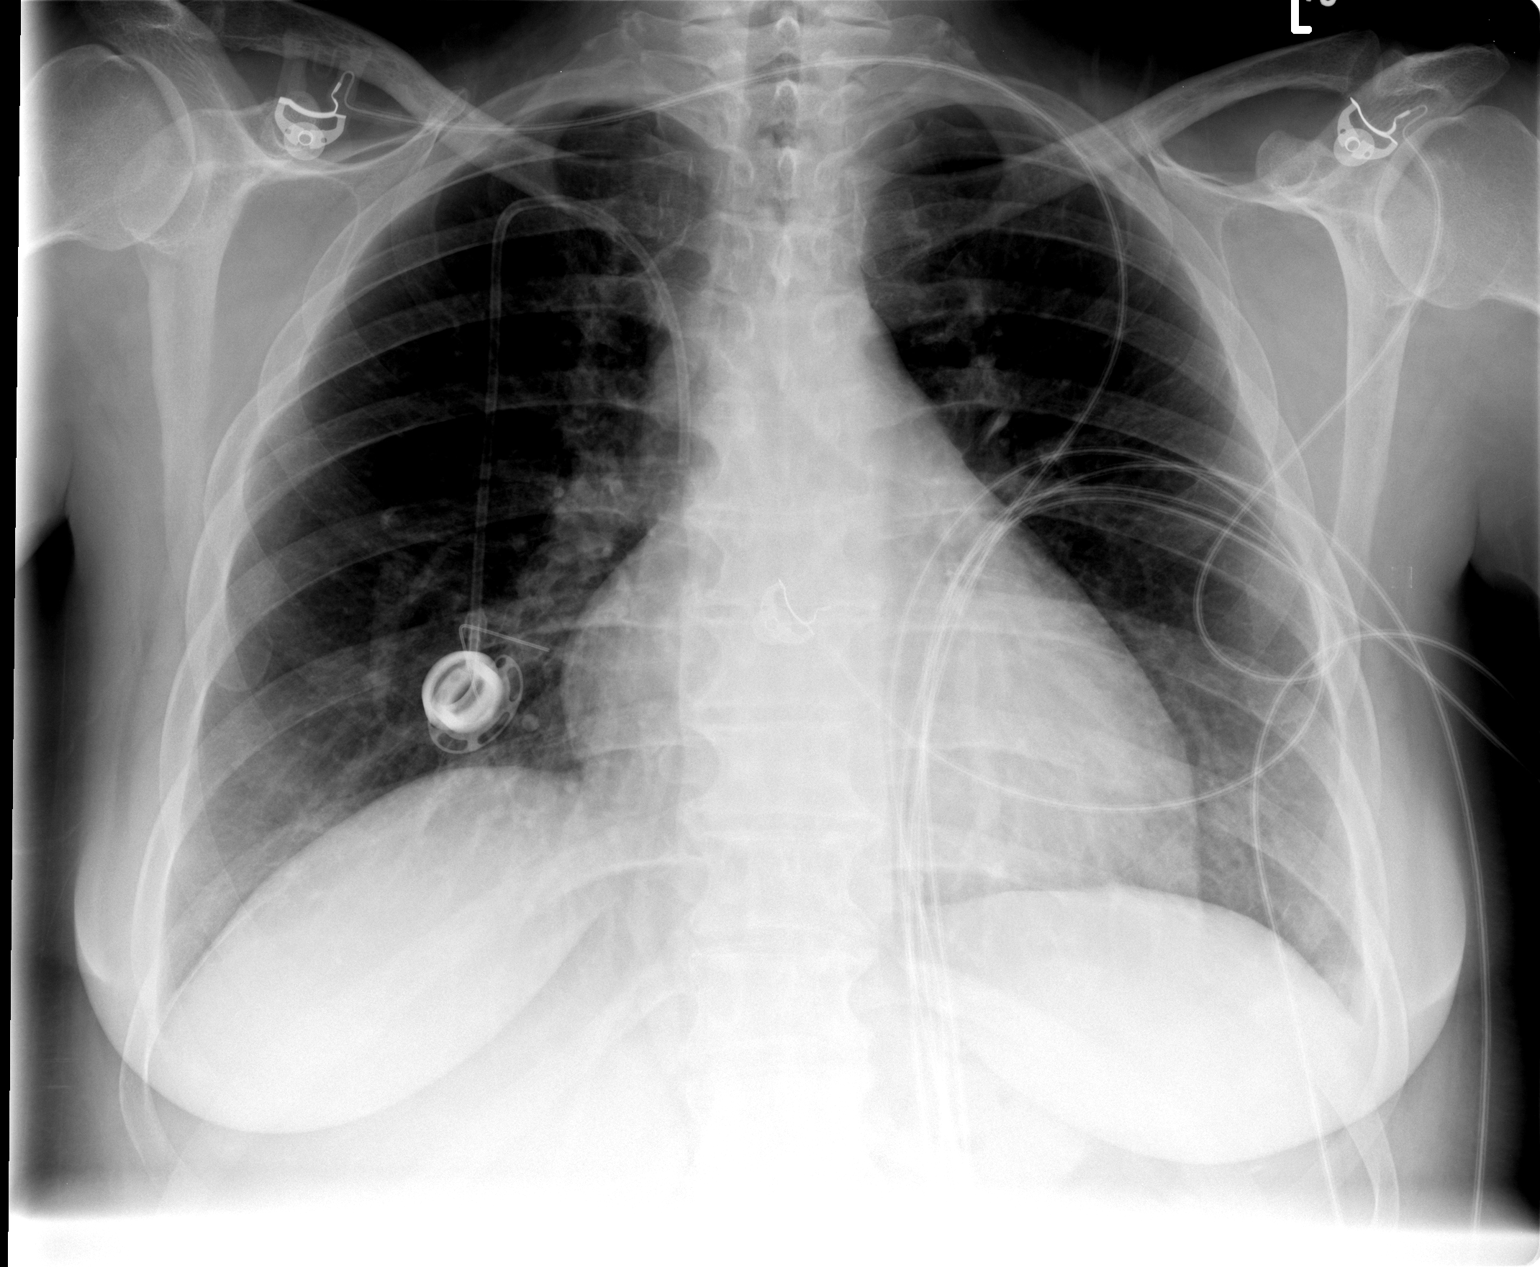

[view not recorded (2 of 2)]
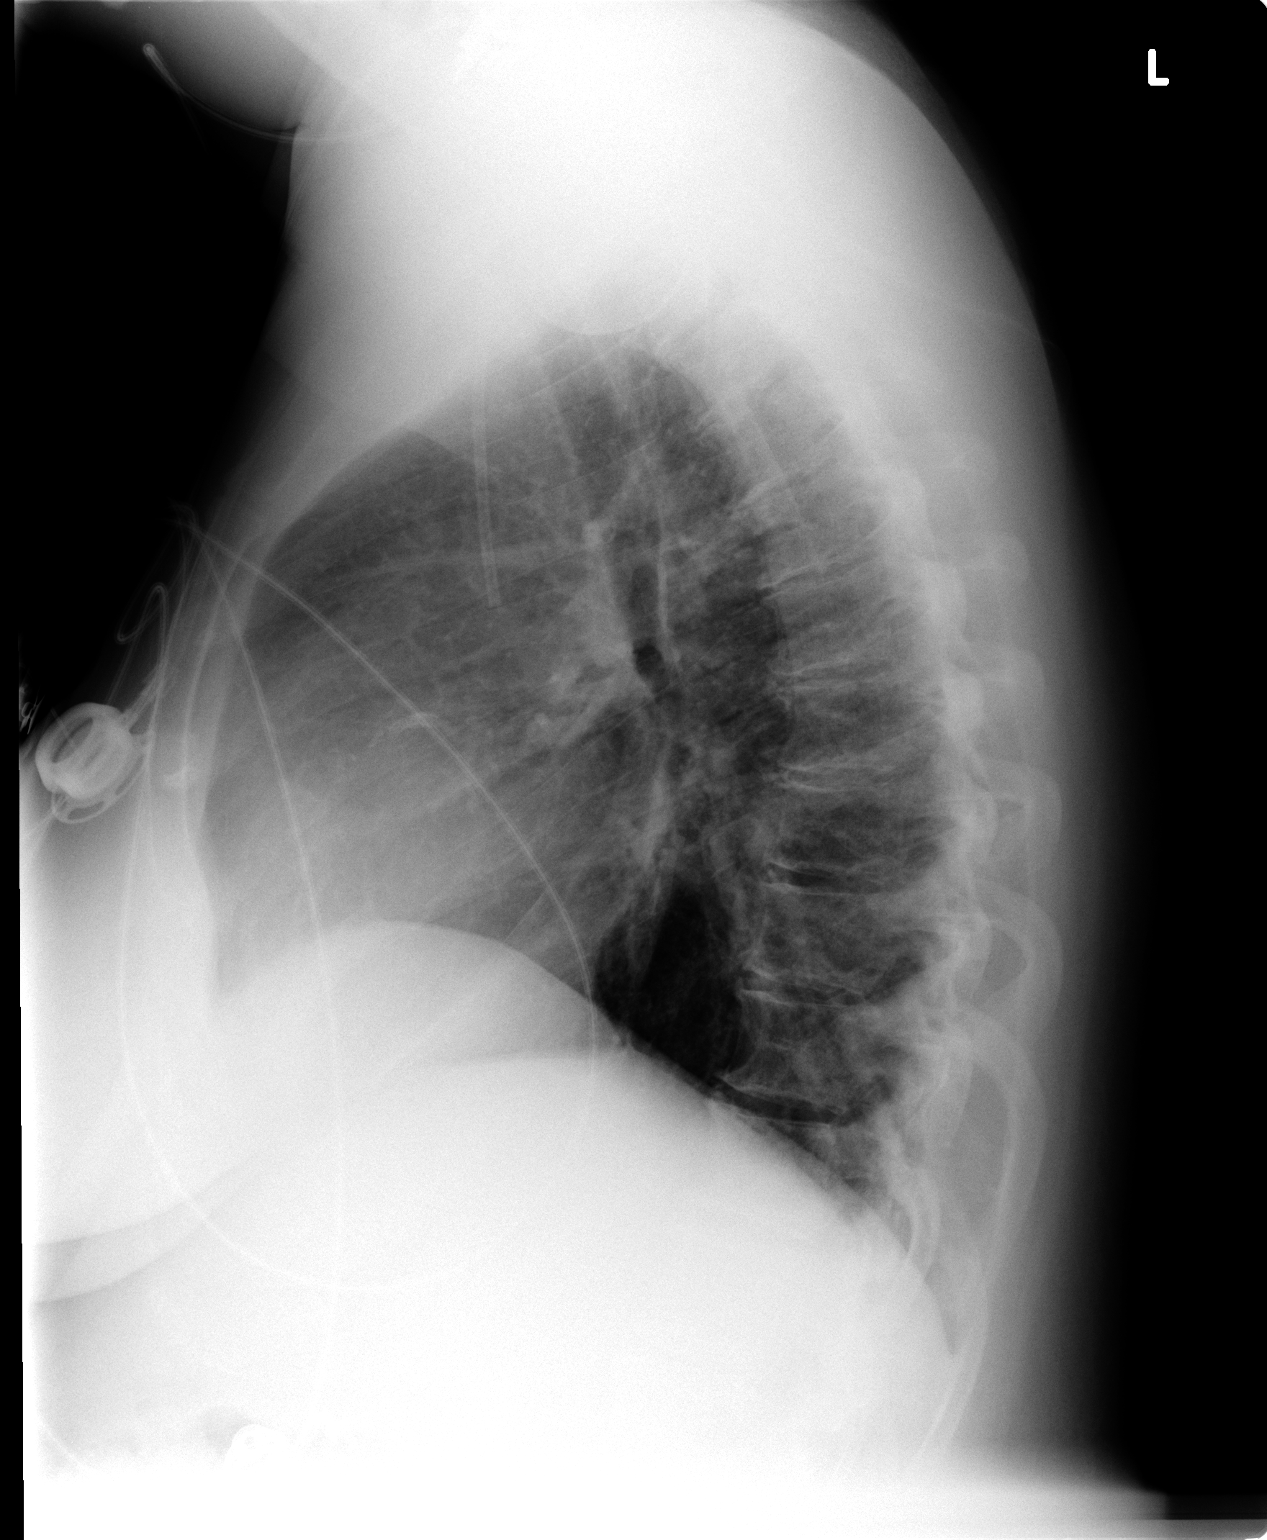

[2 of 2 positions shown; findings below may reference images not displayed]

FINDINGS: Heart size is normal.  Both lungs are clear.  There is no
evidence of pleural effusion.  No mass or adenopathy identified.
Right sided Port-A-Cath remains in appropriate position.
IMPRESSION: No active cardiopulmonary disease.

## 2009-05-26 ENCOUNTER — Ambulatory Visit (HOSPITAL_COMMUNITY): Admission: RE | Admit: 2009-05-26 | Discharge: 2009-05-26 | Payer: Self-pay | Admitting: Family Medicine

## 2009-06-06 ENCOUNTER — Inpatient Hospital Stay (HOSPITAL_COMMUNITY): Admission: AD | Admit: 2009-06-06 | Discharge: 2009-06-08 | Payer: Self-pay | Admitting: Family Medicine

## 2009-06-08 ENCOUNTER — Encounter: Payer: Self-pay | Admitting: Cardiology

## 2009-06-09 ENCOUNTER — Encounter: Payer: Self-pay | Admitting: Cardiology

## 2009-06-09 ENCOUNTER — Encounter (HOSPITAL_COMMUNITY): Admission: RE | Admit: 2009-06-09 | Discharge: 2009-07-09 | Payer: Self-pay | Admitting: Nephrology

## 2009-06-12 IMAGING — CR DG ABDOMEN ACUTE W/ 1V CHEST
4 series · 4 of 4 positions shown · non-contrast
Comparison: 05/01/2008.

CLINICAL DATA: Nausea vomiting and chest pain.

ACUTE ABDOMEN SERIES (ABDOMEN 2 VIEW & CHEST 1 VIEW)

[view not recorded (1 of 4)]
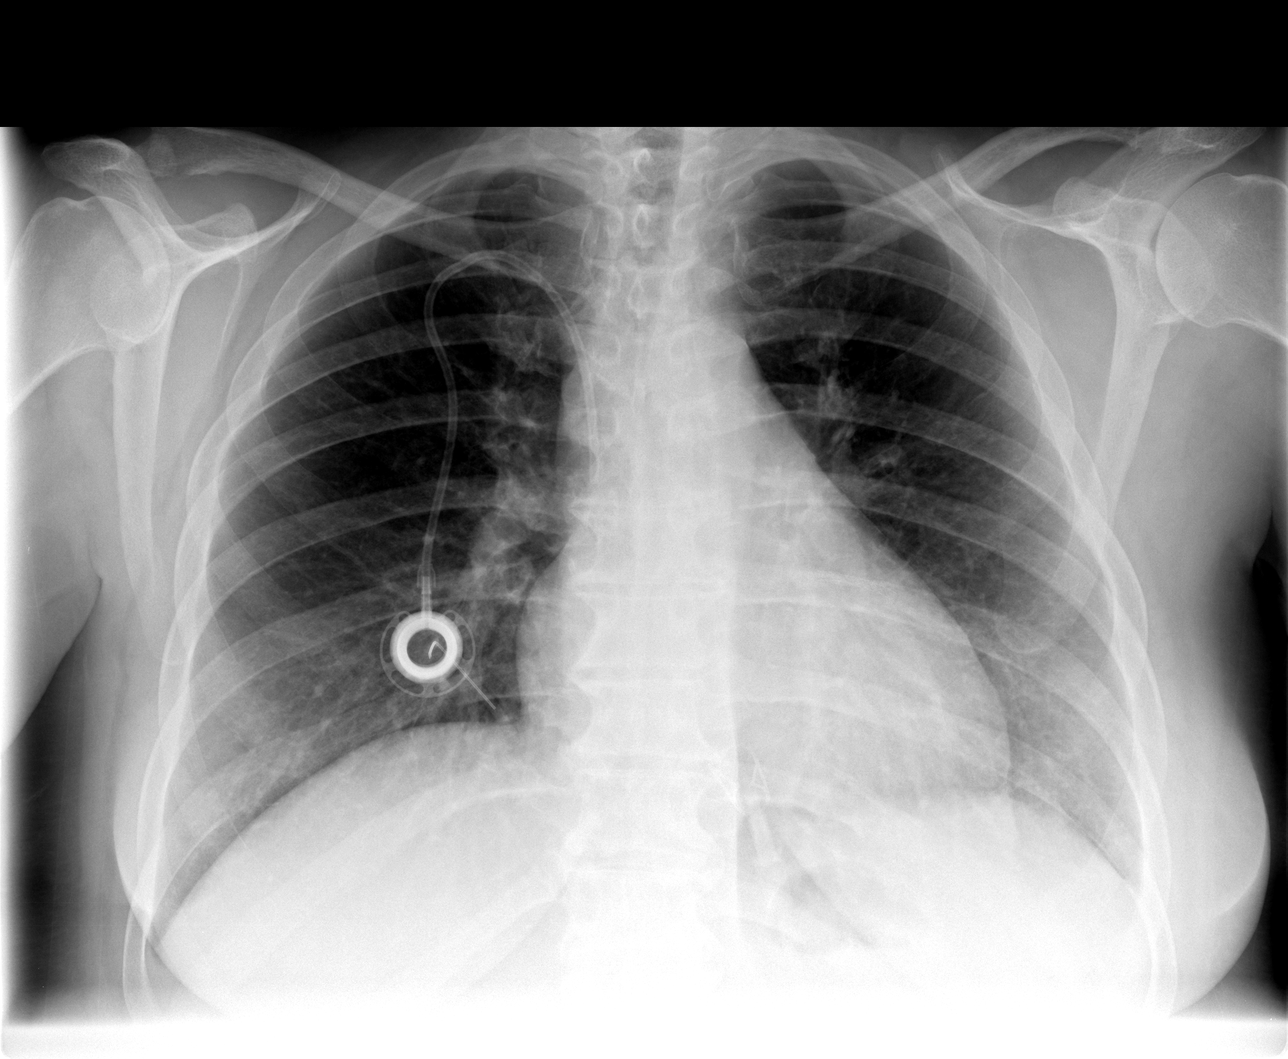

[view not recorded (2 of 4)]
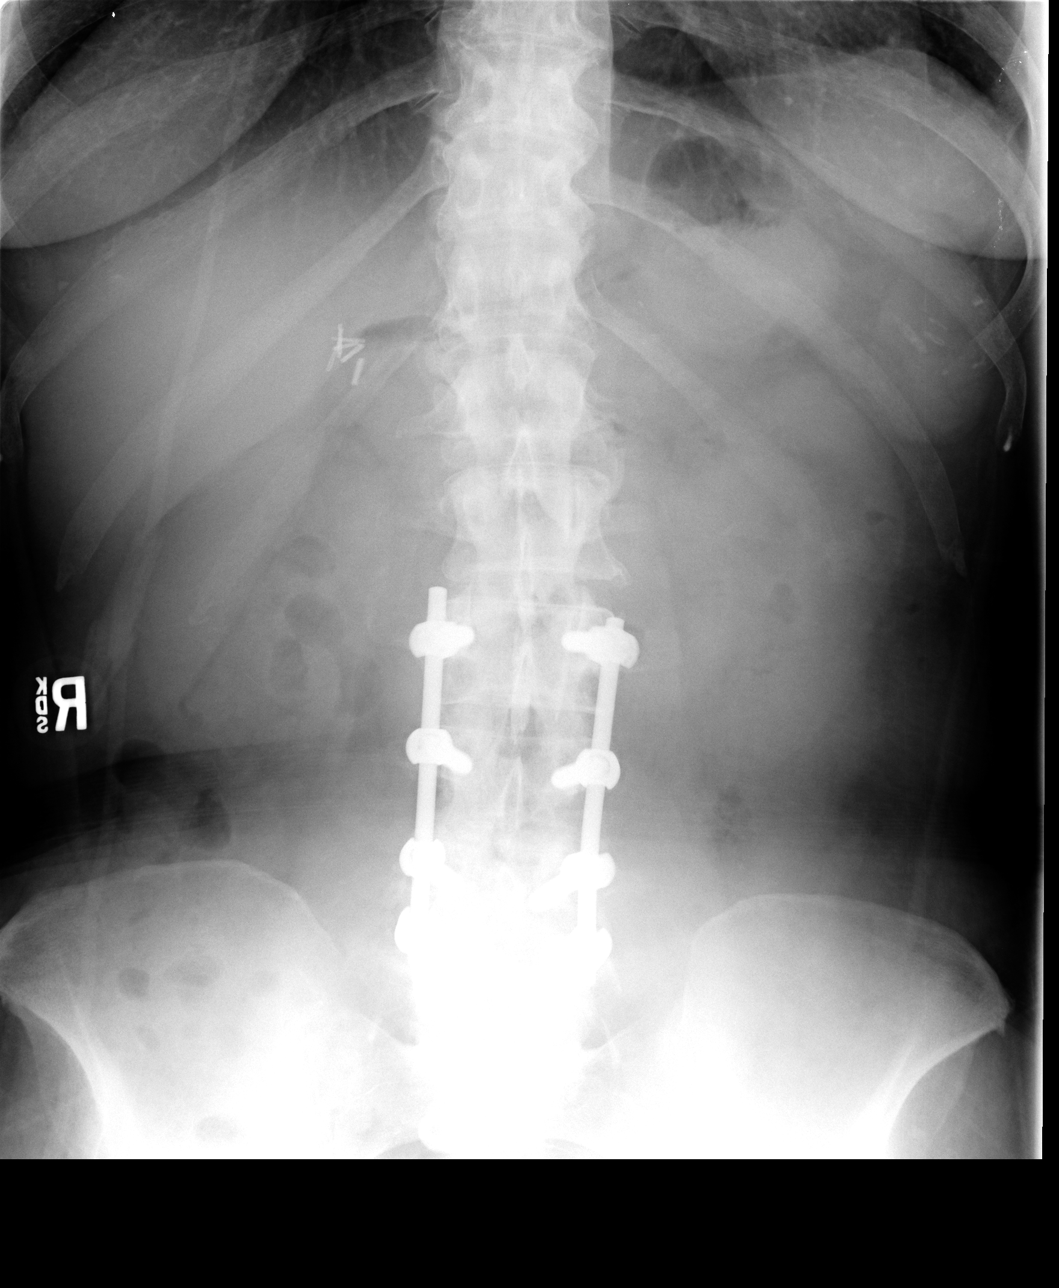

[view not recorded (3 of 4)]
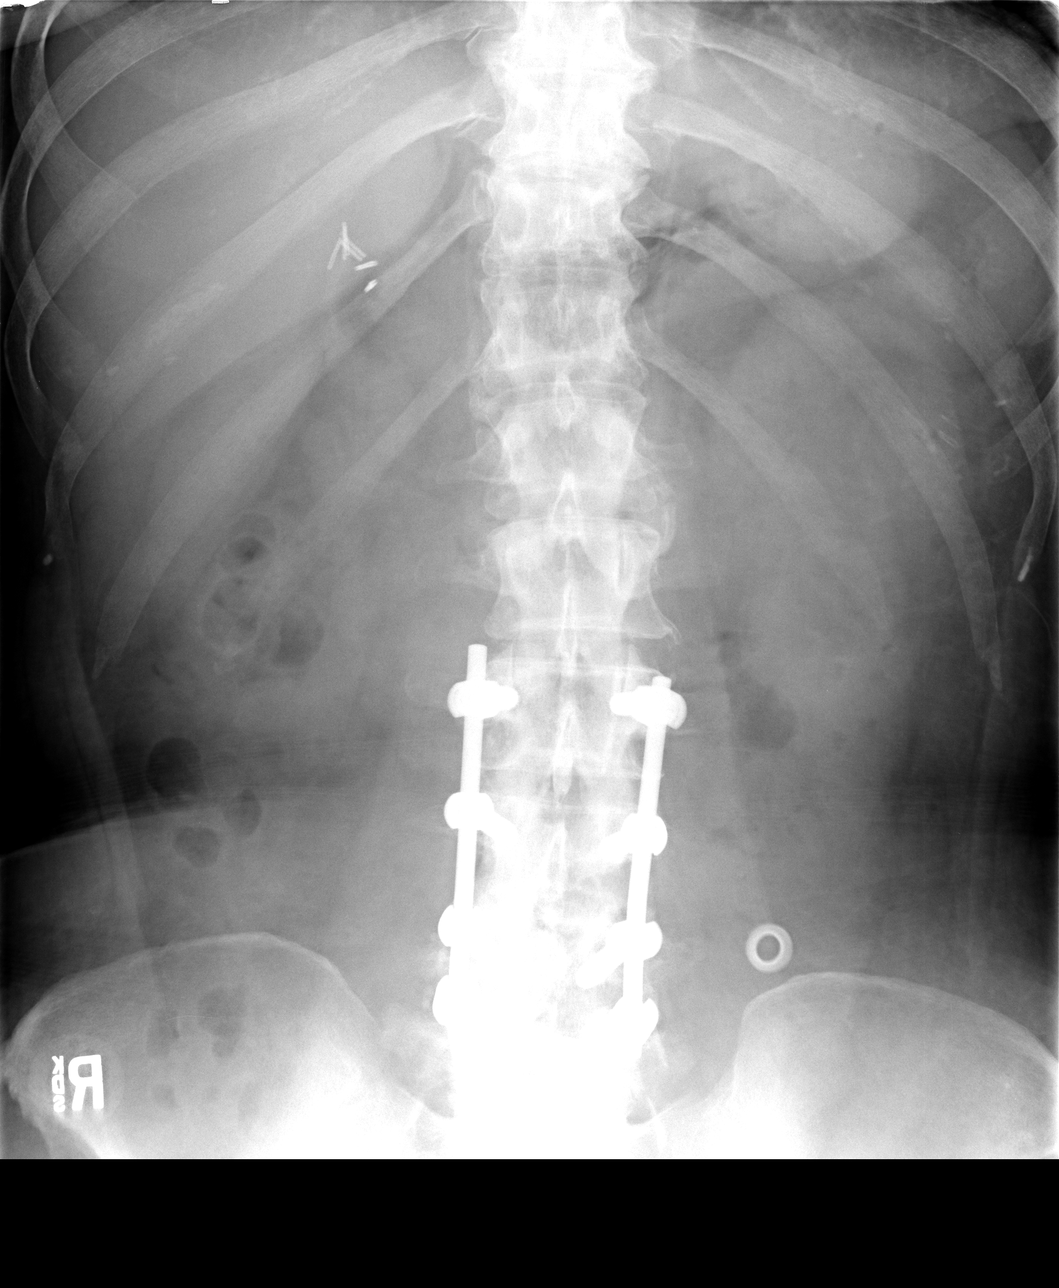

[view not recorded (4 of 4)]
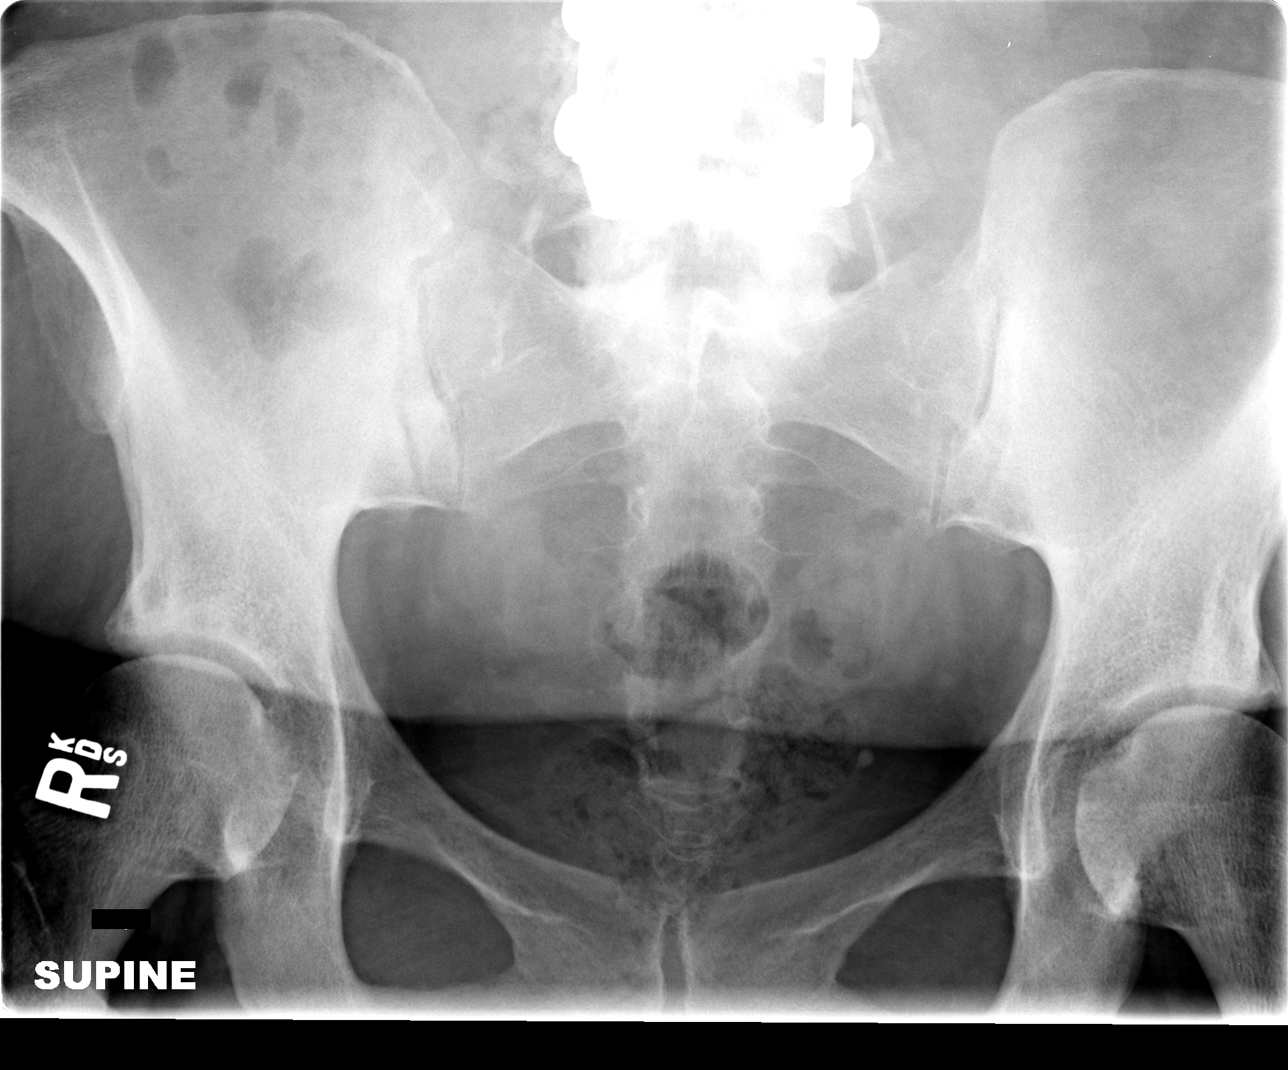

[4 of 4 positions shown; findings below may reference images not displayed]

FINDINGS: A right subclavian Port-A-Cath is stable position.  Heart
size is upper limits of normal.  Lungs are clear.  Postoperative
changes of the lumbar spine are evident.  There is a relative
paucity of gas in the abdomen.  There is no evidence for
obstruction or free air.  Air and stool can be seen to the level of
the rectum.
IMPRESSION: 1.  Borderline cardiomegaly without failure to
2.  No acute abnormality the abdomen.
3.  Postoperative changes of the lumbar spine.

## 2009-07-04 ENCOUNTER — Inpatient Hospital Stay (HOSPITAL_COMMUNITY): Admission: EM | Admit: 2009-07-04 | Discharge: 2009-07-08 | Payer: Self-pay | Admitting: Emergency Medicine

## 2009-07-04 ENCOUNTER — Encounter: Payer: Self-pay | Admitting: Cardiology

## 2009-07-05 ENCOUNTER — Encounter: Payer: Self-pay | Admitting: Cardiology

## 2009-07-27 ENCOUNTER — Ambulatory Visit: Payer: Self-pay | Admitting: Vascular Surgery

## 2009-08-03 ENCOUNTER — Ambulatory Visit (HOSPITAL_COMMUNITY): Payer: Self-pay | Admitting: Nephrology

## 2009-08-03 ENCOUNTER — Encounter (HOSPITAL_COMMUNITY): Admission: RE | Admit: 2009-08-03 | Discharge: 2009-08-24 | Payer: Self-pay | Admitting: Oncology

## 2009-08-08 ENCOUNTER — Ambulatory Visit (HOSPITAL_COMMUNITY): Payer: Self-pay | Admitting: Oncology

## 2009-08-08 ENCOUNTER — Encounter (HOSPITAL_COMMUNITY): Admission: RE | Admit: 2009-08-08 | Discharge: 2009-08-24 | Payer: Self-pay | Admitting: Oncology

## 2009-08-10 ENCOUNTER — Emergency Department (HOSPITAL_COMMUNITY): Admission: EM | Admit: 2009-08-10 | Discharge: 2009-08-10 | Payer: Self-pay | Admitting: Emergency Medicine

## 2009-08-29 ENCOUNTER — Encounter: Payer: Self-pay | Admitting: Gastroenterology

## 2009-08-31 ENCOUNTER — Encounter (HOSPITAL_COMMUNITY): Admission: RE | Admit: 2009-08-31 | Discharge: 2009-09-30 | Payer: Self-pay | Admitting: Nephrology

## 2009-09-01 ENCOUNTER — Ambulatory Visit: Payer: Self-pay | Admitting: Vascular Surgery

## 2009-09-01 ENCOUNTER — Ambulatory Visit (HOSPITAL_COMMUNITY): Admission: RE | Admit: 2009-09-01 | Discharge: 2009-09-01 | Payer: Self-pay | Admitting: Psychiatry

## 2009-09-19 ENCOUNTER — Ambulatory Visit (HOSPITAL_COMMUNITY): Payer: Self-pay | Admitting: Nephrology

## 2009-09-19 ENCOUNTER — Encounter (HOSPITAL_COMMUNITY): Admission: RE | Admit: 2009-09-19 | Discharge: 2009-10-19 | Payer: Self-pay | Admitting: Oncology

## 2009-09-20 ENCOUNTER — Ambulatory Visit: Payer: Self-pay | Admitting: Vascular Surgery

## 2009-09-20 ENCOUNTER — Encounter: Payer: Self-pay | Admitting: Vascular Surgery

## 2009-09-27 ENCOUNTER — Ambulatory Visit: Payer: Self-pay | Admitting: Vascular Surgery

## 2009-09-27 ENCOUNTER — Encounter: Payer: Self-pay | Admitting: Cardiology

## 2009-10-07 ENCOUNTER — Encounter: Payer: Self-pay | Admitting: Cardiology

## 2009-10-07 ENCOUNTER — Ambulatory Visit: Payer: Self-pay | Admitting: Vascular Surgery

## 2009-10-08 ENCOUNTER — Ambulatory Visit: Payer: Self-pay | Admitting: Vascular Surgery

## 2009-10-08 ENCOUNTER — Ambulatory Visit (HOSPITAL_COMMUNITY): Admission: RE | Admit: 2009-10-08 | Discharge: 2009-10-08 | Payer: Self-pay | Admitting: Gastroenterology

## 2009-10-11 ENCOUNTER — Encounter (HOSPITAL_COMMUNITY): Admission: RE | Admit: 2009-10-11 | Discharge: 2009-11-10 | Payer: Self-pay | Admitting: Nephrology

## 2009-10-17 IMAGING — US US RENAL
1 series · 14 of 25 positions shown · non-contrast
Comparison: None

CLINICAL DATA: Nausea, vomiting, elevated creatinine, history
hypertension, diabetes

RENAL/URINARY TRACT ULTRASOUND
TECHNIQUE: Complete ultrasound examination of the urinary tract
was performed including evaluation of the kidneys, renal collecting
systems, and urinary bladder.

[Series 1: unknown · 0.26mm/px · 14 of 26 slices shown]
[im 1/26]
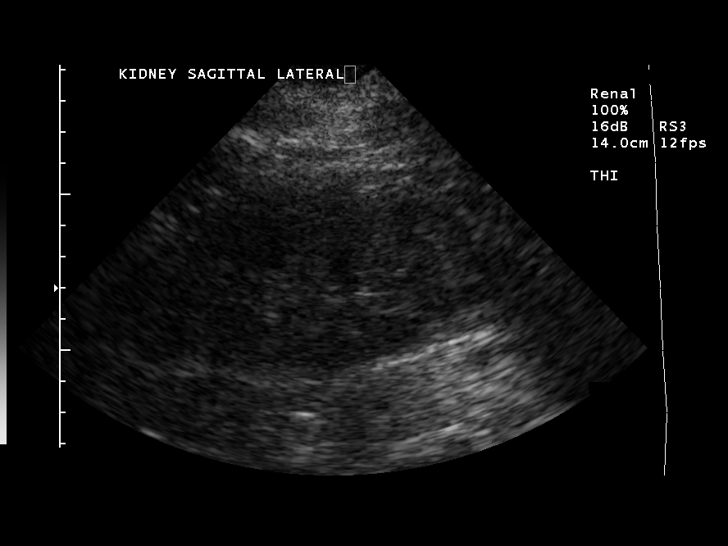
[im 3/26]
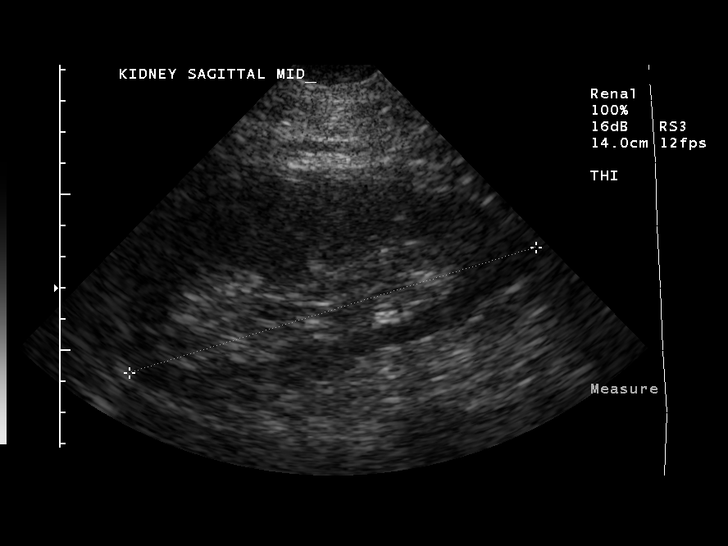
[im 5/26]
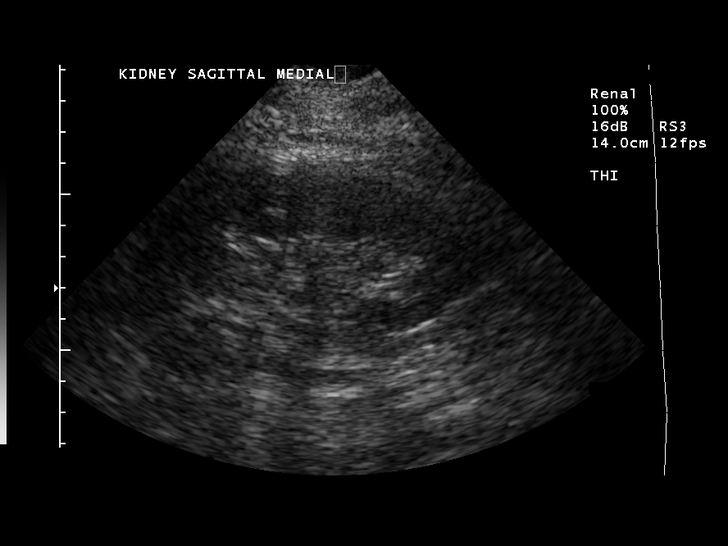
[im 7/26]
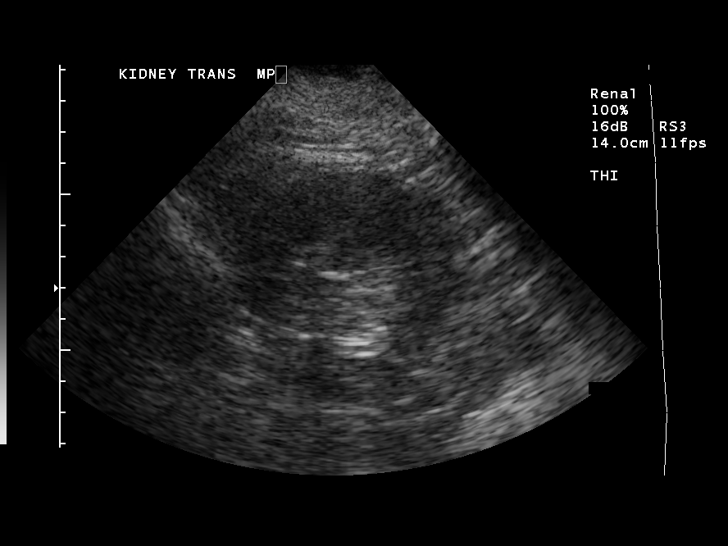
[im 9/26]
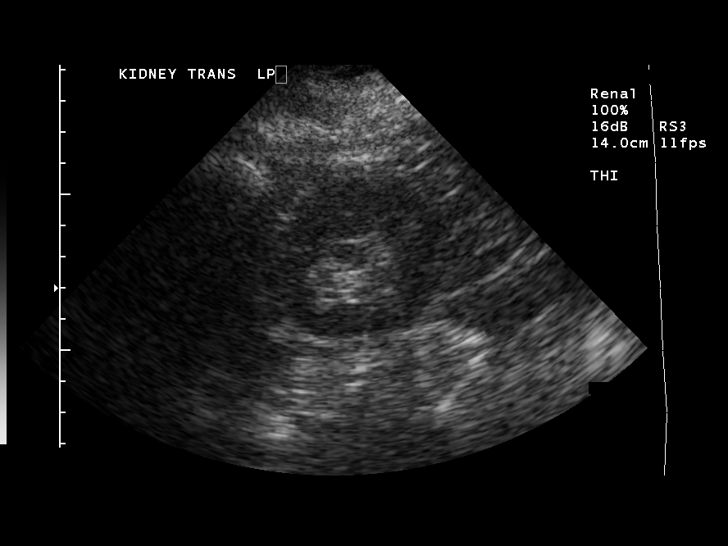
[im 10/26]
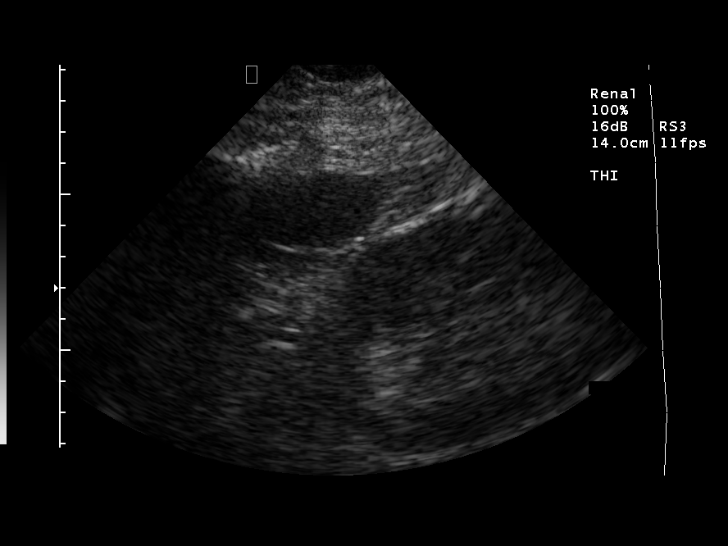
[im 12/26]
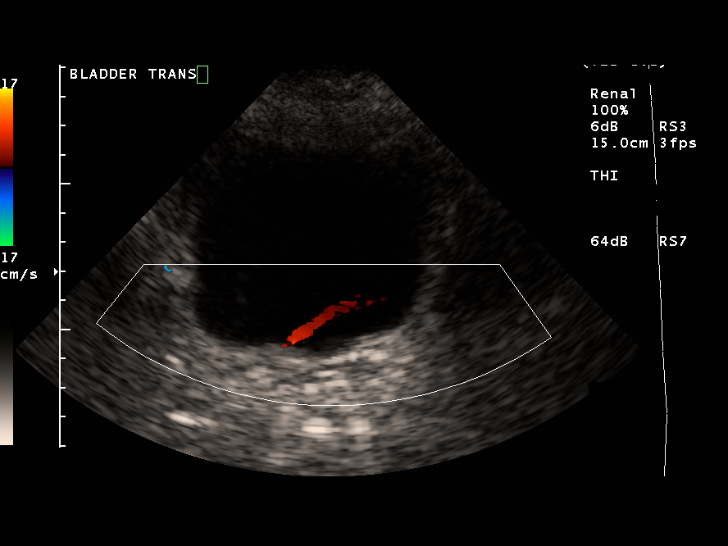
[im 14/26]
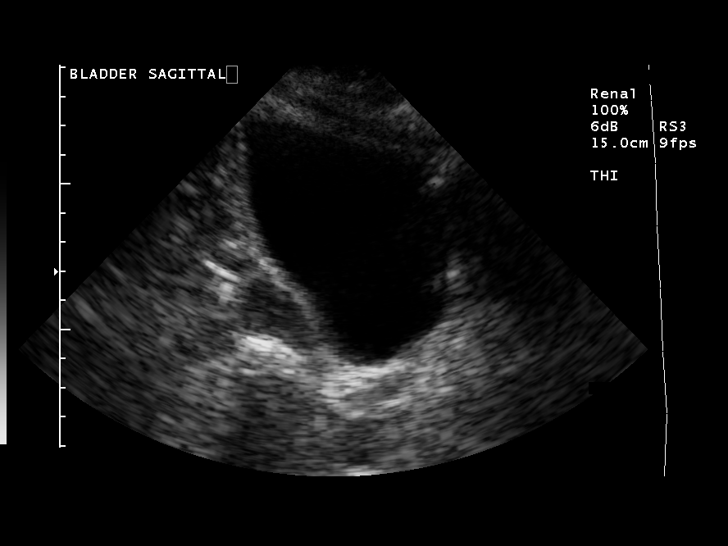
[im 16/26]
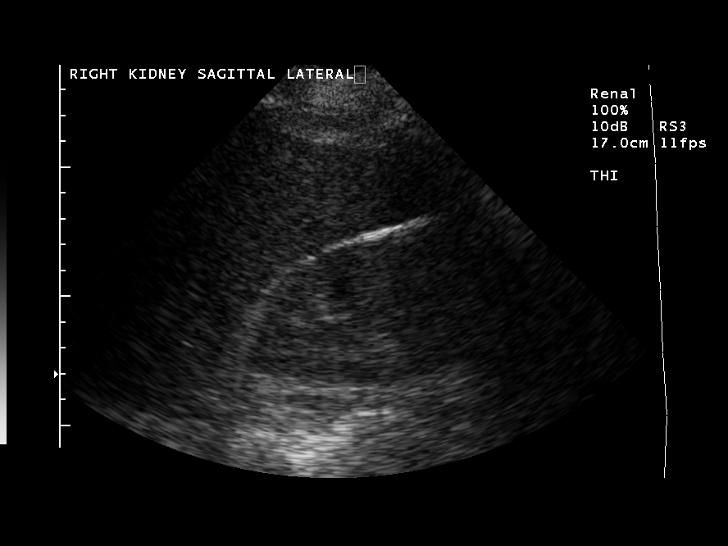
[im 17/26]
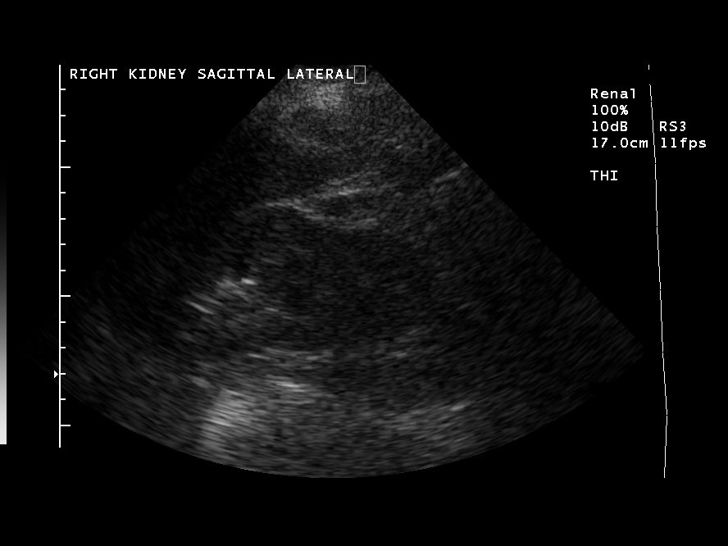
[im 19/26]
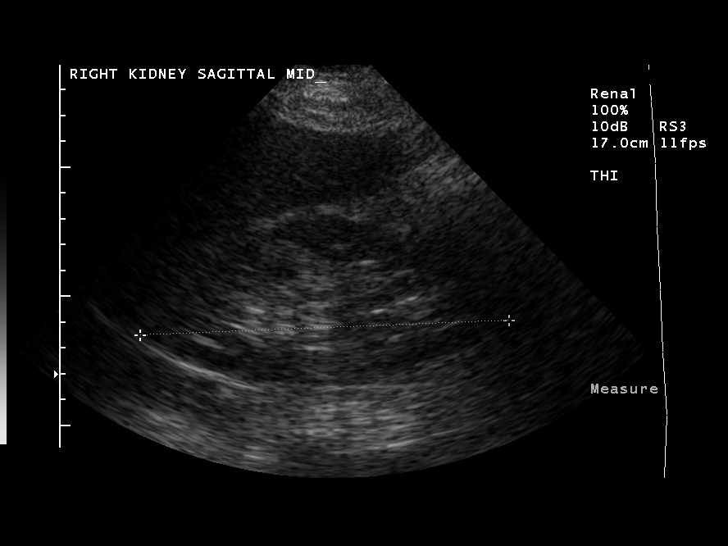
[im 21/26]
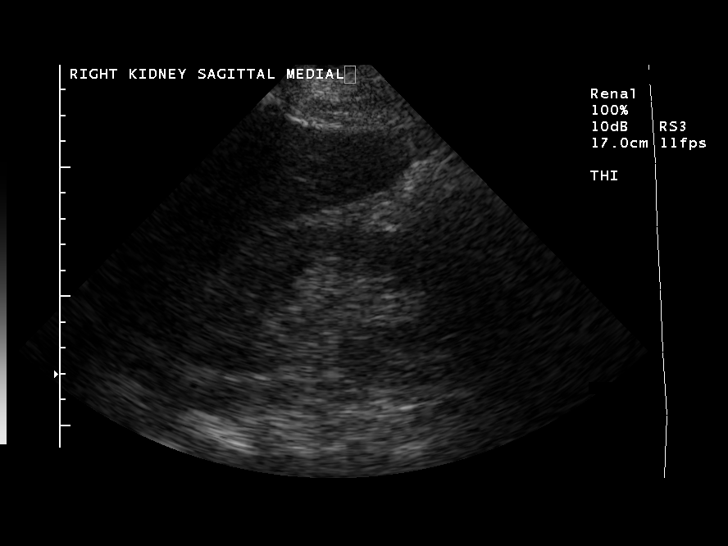
[im 23/26]
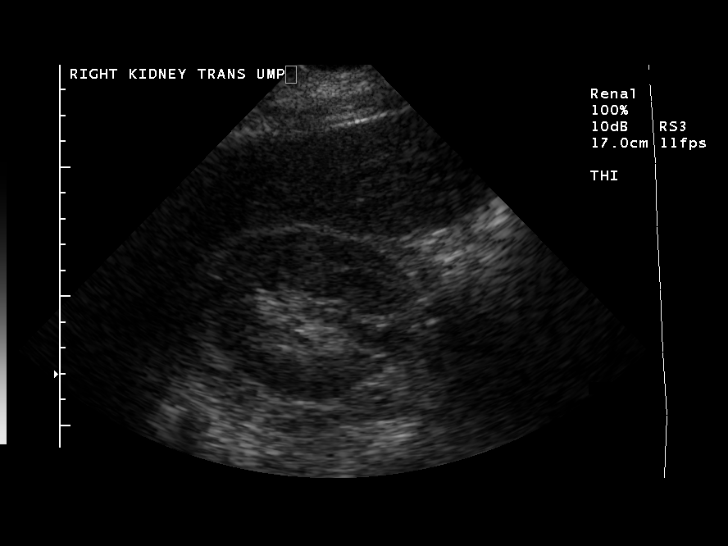
[im 26/26]
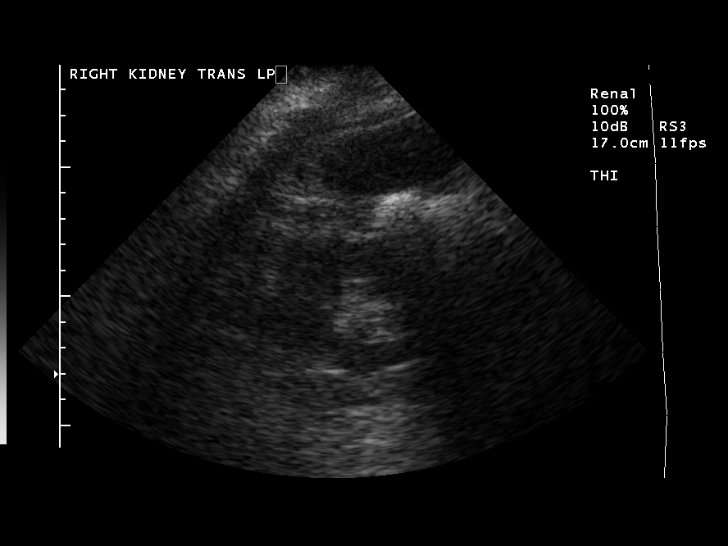

[14 of 25 positions shown; findings below may reference images not displayed]

FINDINGS: Kidneys newly prominent size at 14.3 cm length right 13.6 cm length
left.
This can be seen in patient with diabetes.
Normal renal cortical thickness and echogenicity bilaterally
patient size.
No renal mass, hydronephrosis, or shadowing calcification.
Well-distended unremarkable appearing urinary bladder.
Bilateral ureteral jets noted.
IMPRESSION: Prominent renal sizes, question related to diabetes.
Otherwise negative renal ultrasound.

## 2009-10-19 ENCOUNTER — Ambulatory Visit: Payer: Self-pay | Admitting: Vascular Surgery

## 2009-10-26 ENCOUNTER — Ambulatory Visit: Payer: Self-pay | Admitting: Vascular Surgery

## 2009-10-31 ENCOUNTER — Ambulatory Visit: Payer: Self-pay | Admitting: Surgery

## 2009-10-31 ENCOUNTER — Encounter (HOSPITAL_COMMUNITY): Admission: RE | Admit: 2009-10-31 | Discharge: 2009-11-30 | Payer: Self-pay | Admitting: Family Medicine

## 2009-11-04 ENCOUNTER — Encounter: Payer: Self-pay | Admitting: Cardiology

## 2009-11-07 ENCOUNTER — Ambulatory Visit (HOSPITAL_COMMUNITY): Payer: Self-pay | Admitting: Nephrology

## 2009-11-14 ENCOUNTER — Encounter (HOSPITAL_COMMUNITY): Admission: RE | Admit: 2009-11-14 | Discharge: 2009-12-14 | Payer: Self-pay | Admitting: Oncology

## 2009-11-16 ENCOUNTER — Ambulatory Visit: Payer: Self-pay | Admitting: Vascular Surgery

## 2009-11-16 IMAGING — US US BREAST*R*
1 series · 5 of 5 positions shown · non-contrast
Comparison: None.

CLINICAL DATA: Small mass felt by the patient in the 12 o'clock
position of the right breast for the past month.

DIGITAL DIAGNOSTIC  BILATERAL  MAMMOGRAM  WITH CAD AND RIGHT BREAST
ULTRASOUND:

[Series 1: us breast right · 0.06mm/px · 5 of 5 slices shown]
[im 1/5]
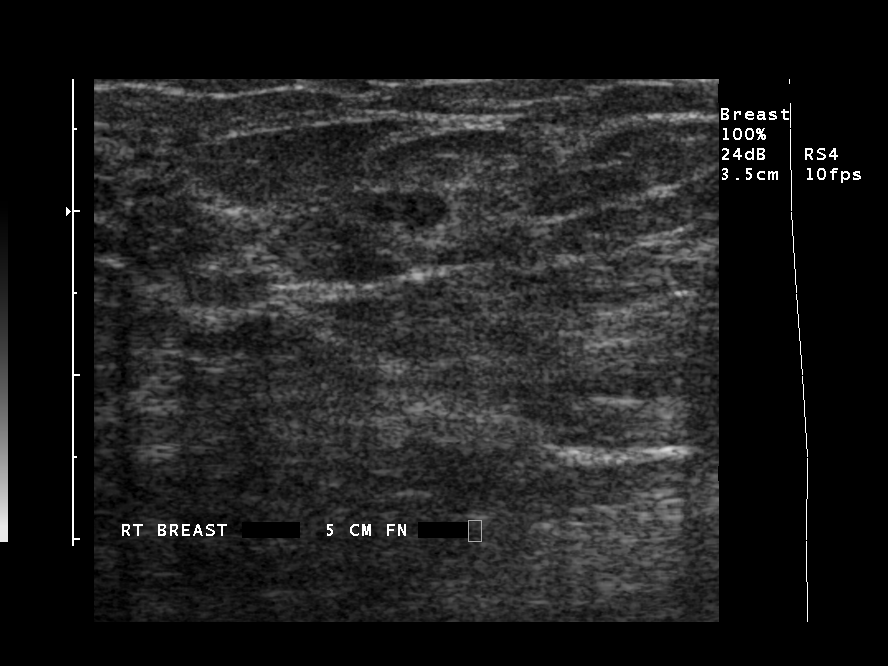
[im 2/5]
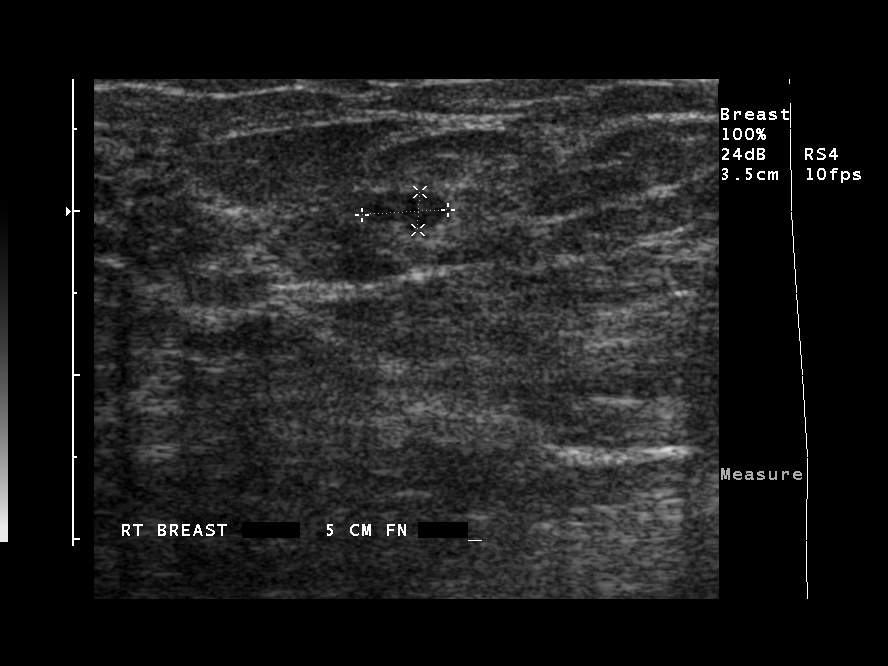
[im 3/5]
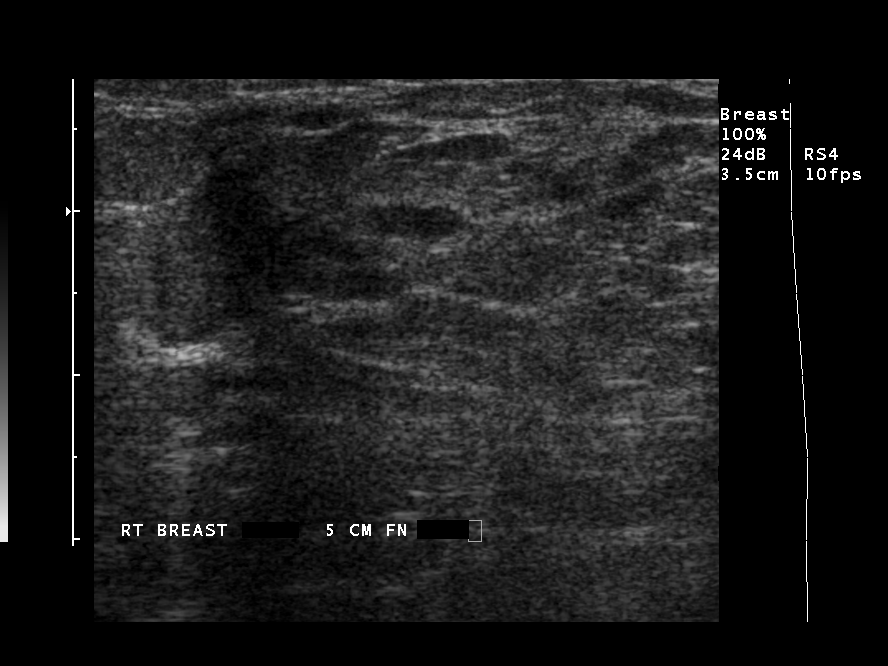
[im 4/5]
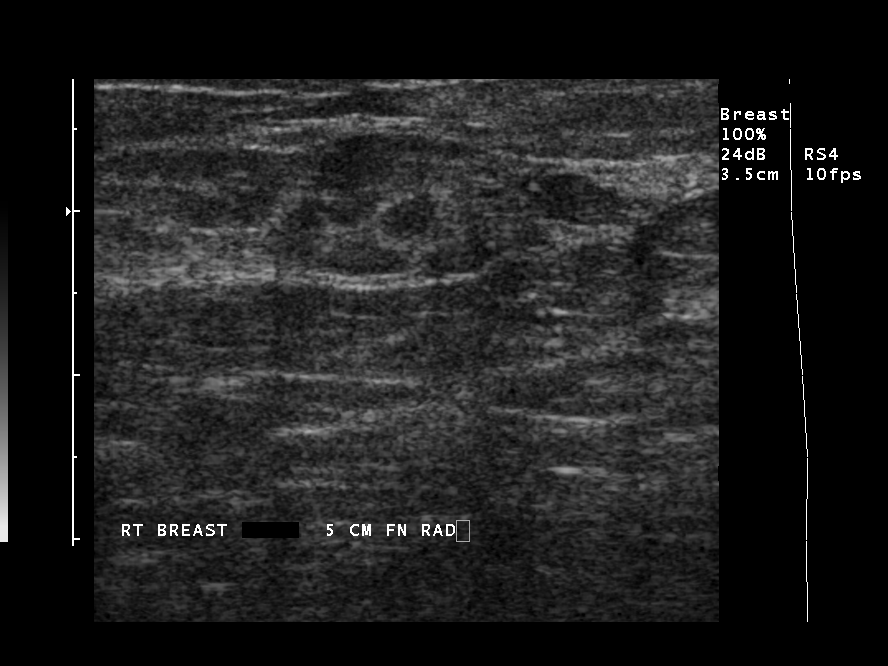
[im 5/5]
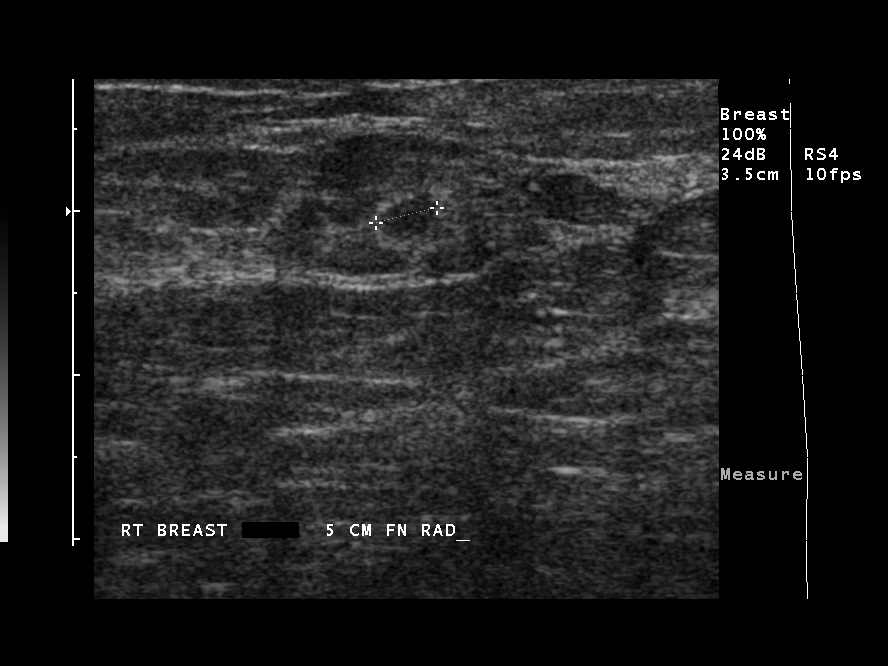

[5 of 5 positions shown; findings below may reference images not displayed]

FINDINGS: Scattered fibroglandular tissue in both breasts.  Port
catheter on the right.  No mammographic findings suspicious for
malignancy.

On physical exam, no mass is palpable in the 12 o'clock position of
the right breast today.

Ultrasound is performed, showing a 6 x 4 x 2 mm oval, smoothly
marginated, hypoechoic mass in the 12 o'clock position of the right
breast, 5 cm from the nipple.  This corresponds to the mass felt by
the patient.  This is horizontally oriented.
IMPRESSION: 6 x 4 x 2 mm probable fibroadenoma in the 12 o'clock position of
the right breast.  A follow-up right breast ultrasound is
recommended in 6 months to assess stability.  The patient was
instructed to return sooner if she feels that this enlarges
significantly during that time.

BI-RADS CATEGORY 3:  Probably benign finding(s) - short interval
follow-up suggested.

## 2009-11-30 ENCOUNTER — Ambulatory Visit: Payer: Self-pay | Admitting: Vascular Surgery

## 2009-11-30 ENCOUNTER — Encounter: Payer: Self-pay | Admitting: Cardiology

## 2009-12-05 ENCOUNTER — Ambulatory Visit: Payer: Self-pay | Admitting: Cardiology

## 2009-12-05 ENCOUNTER — Encounter (INDEPENDENT_AMBULATORY_CARE_PROVIDER_SITE_OTHER): Payer: Self-pay | Admitting: *Deleted

## 2009-12-05 DIAGNOSIS — N049 Nephrotic syndrome with unspecified morphologic changes: Secondary | ICD-10-CM | POA: Insufficient documentation

## 2009-12-05 DIAGNOSIS — Z86718 Personal history of other venous thrombosis and embolism: Secondary | ICD-10-CM

## 2009-12-05 DIAGNOSIS — R0602 Shortness of breath: Secondary | ICD-10-CM

## 2009-12-05 DIAGNOSIS — N186 End stage renal disease: Secondary | ICD-10-CM

## 2009-12-05 DIAGNOSIS — R072 Precordial pain: Secondary | ICD-10-CM | POA: Insufficient documentation

## 2009-12-05 DIAGNOSIS — I82409 Acute embolism and thrombosis of unspecified deep veins of unspecified lower extremity: Secondary | ICD-10-CM | POA: Insufficient documentation

## 2009-12-09 ENCOUNTER — Ambulatory Visit: Payer: Self-pay | Admitting: Cardiology

## 2009-12-13 ENCOUNTER — Encounter (INDEPENDENT_AMBULATORY_CARE_PROVIDER_SITE_OTHER): Payer: Self-pay | Admitting: *Deleted

## 2009-12-19 ENCOUNTER — Encounter (HOSPITAL_COMMUNITY): Admission: RE | Admit: 2009-12-19 | Discharge: 2010-01-18 | Payer: Self-pay | Admitting: Nephrology

## 2009-12-26 ENCOUNTER — Ambulatory Visit (HOSPITAL_COMMUNITY): Payer: Self-pay | Admitting: Nephrology

## 2009-12-29 ENCOUNTER — Ambulatory Visit (HOSPITAL_COMMUNITY): Admission: RE | Admit: 2009-12-29 | Discharge: 2009-12-29 | Payer: Self-pay | Admitting: Vascular Surgery

## 2009-12-29 ENCOUNTER — Ambulatory Visit: Payer: Self-pay | Admitting: Vascular Surgery

## 2010-01-05 IMAGING — CR DG ABDOMEN ACUTE W/ 1V CHEST
3 series · 3 of 3 positions shown · non-contrast
Comparison: 06/14/2008

CLINICAL DATA: Abdominal pain and vomiting.

ACUTE ABDOMEN SERIES (ABDOMEN 2 VIEW & CHEST 1 VIEW)

[view not recorded (1 of 3)]
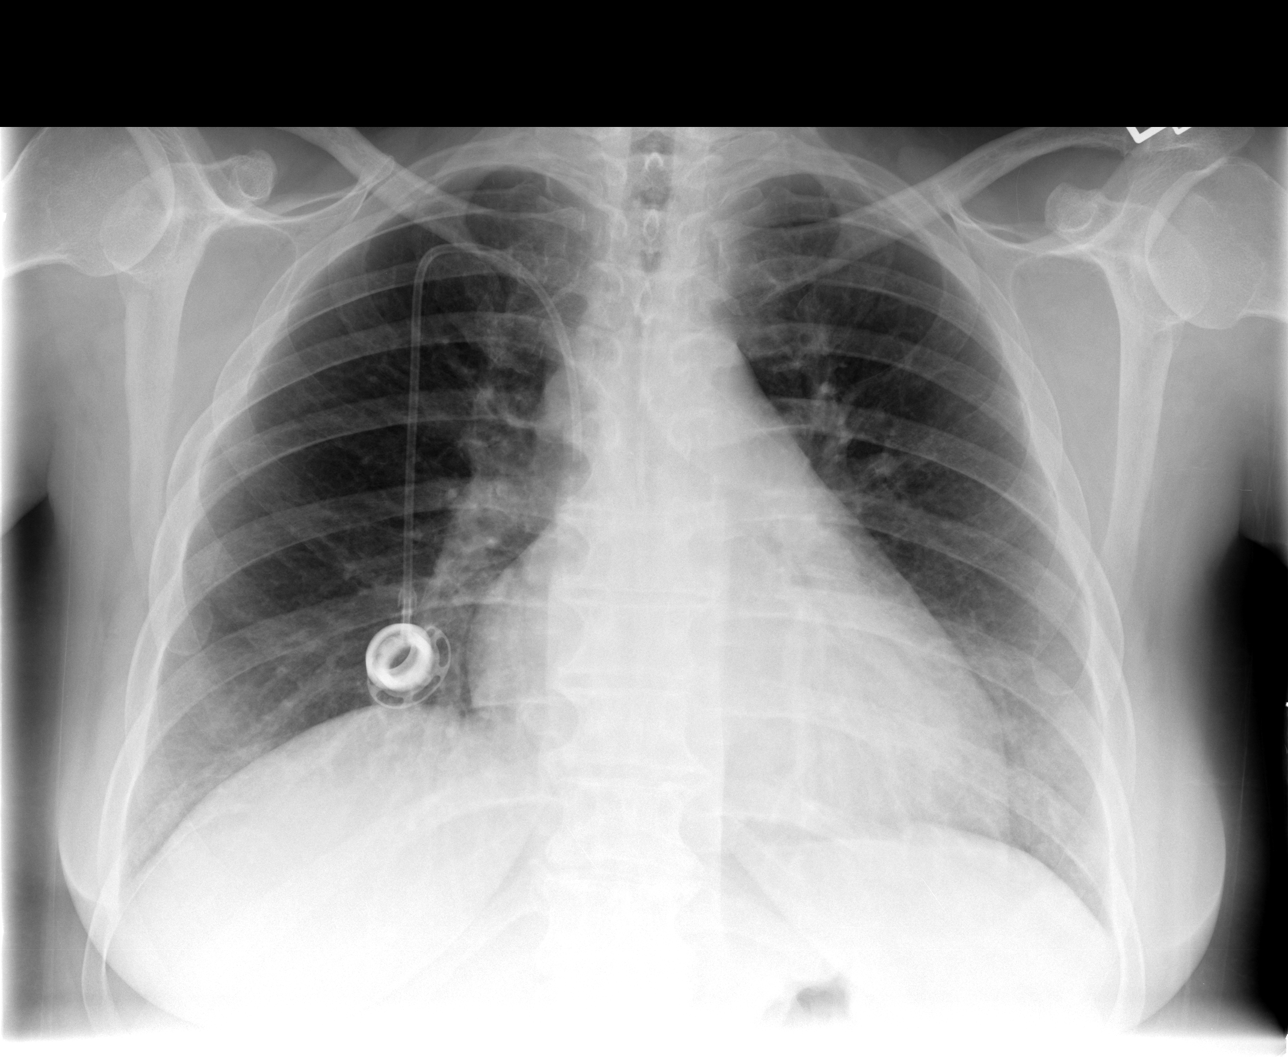

[view not recorded (2 of 3)]
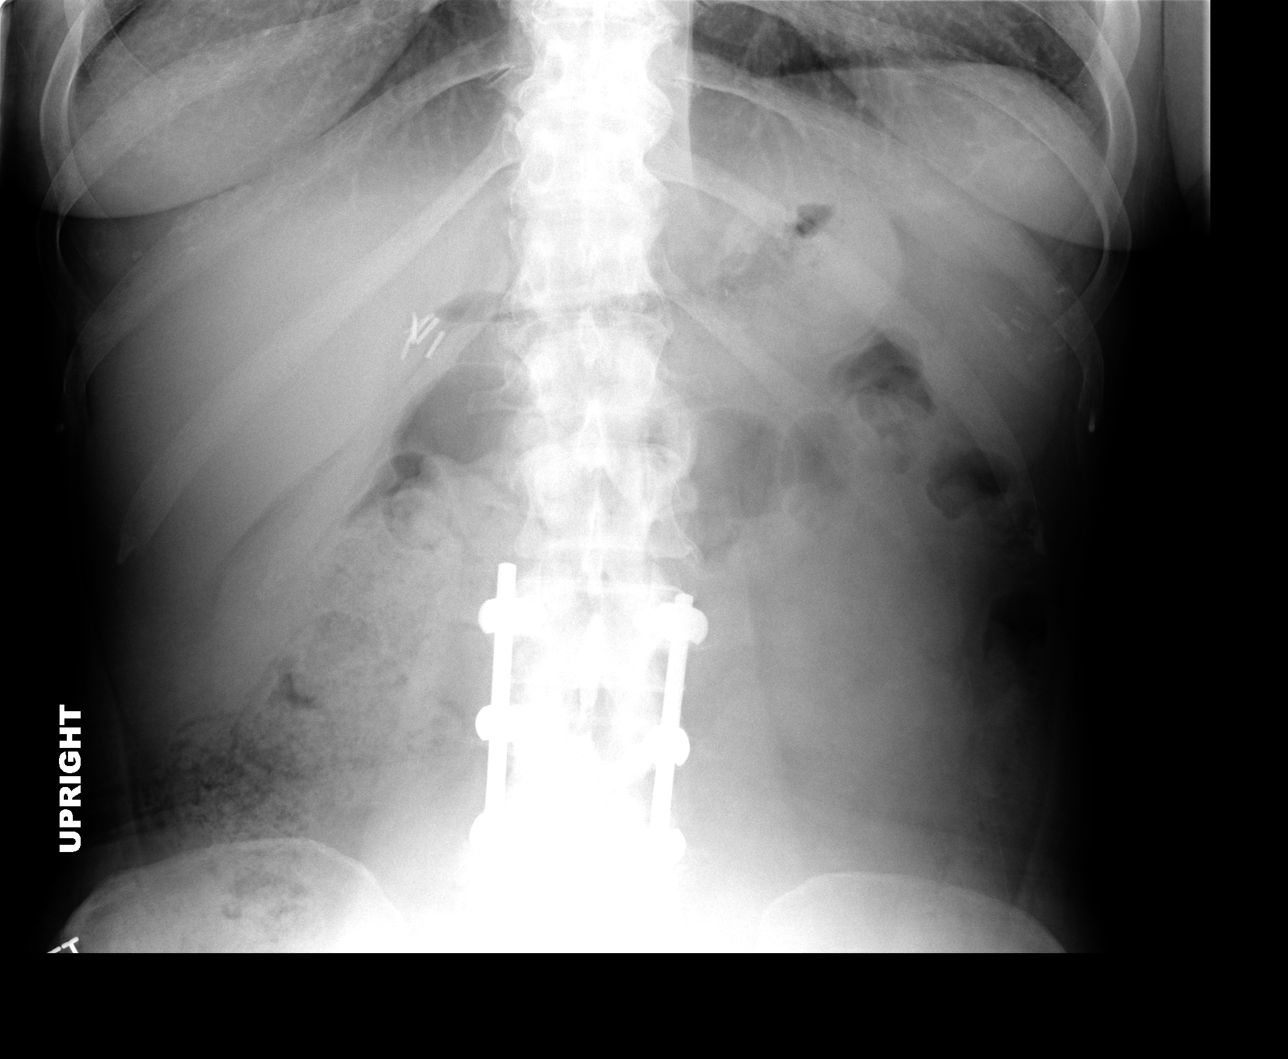

[view not recorded (3 of 3)]
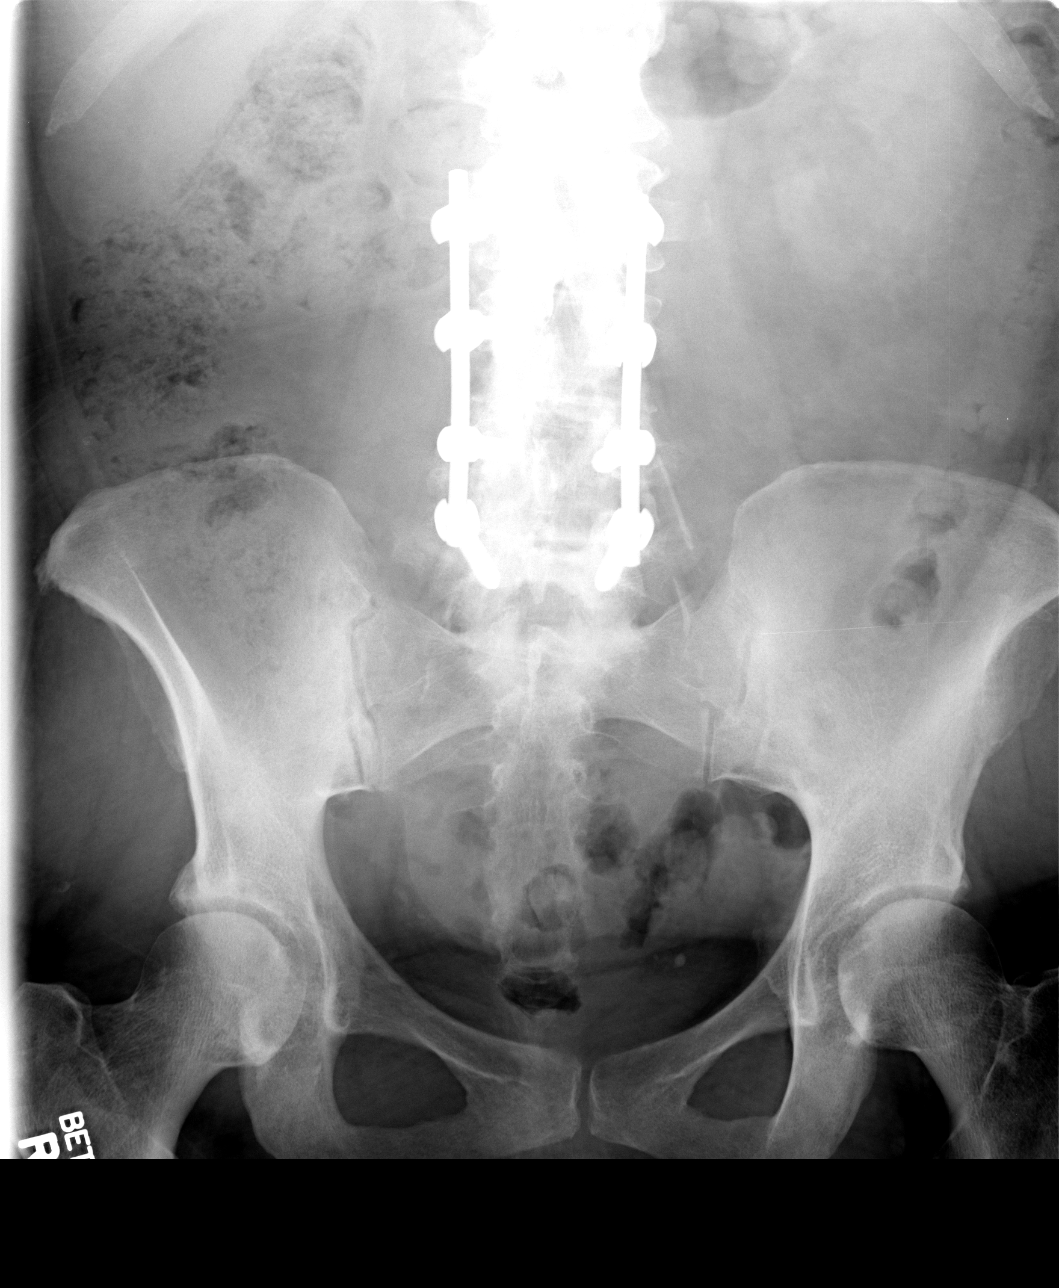

[3 of 3 positions shown; findings below may reference images not displayed]

FINDINGS: Port-A-Cath is in the SVC.  Mild cardiomegaly.  Minimal
accentuation of interstitial/peribronchial markings in this patient
who reportedly a smoker.  No CHF.  No infiltrate.  Negative for
ileus or bowel obstruction.  Cholecystectomy clips.  Generous right
colon stool burden.  Spinal fusion hardware.
IMPRESSION: Mild cardiomegaly.  No acute chest or abdominal findings.

## 2010-01-09 ENCOUNTER — Ambulatory Visit (HOSPITAL_COMMUNITY): Payer: Self-pay | Admitting: Oncology

## 2010-01-13 ENCOUNTER — Ambulatory Visit: Payer: Self-pay | Admitting: Vascular Surgery

## 2010-01-18 IMAGING — CR DG LUMBAR SPINE 2-3V
3 series · 3 of 3 positions shown · non-contrast
Comparison: 11/10/2005

CLINICAL DATA: Low back pain.

LUMBAR SPINE - 2-3 VIEW

[t l-spine a.p.]
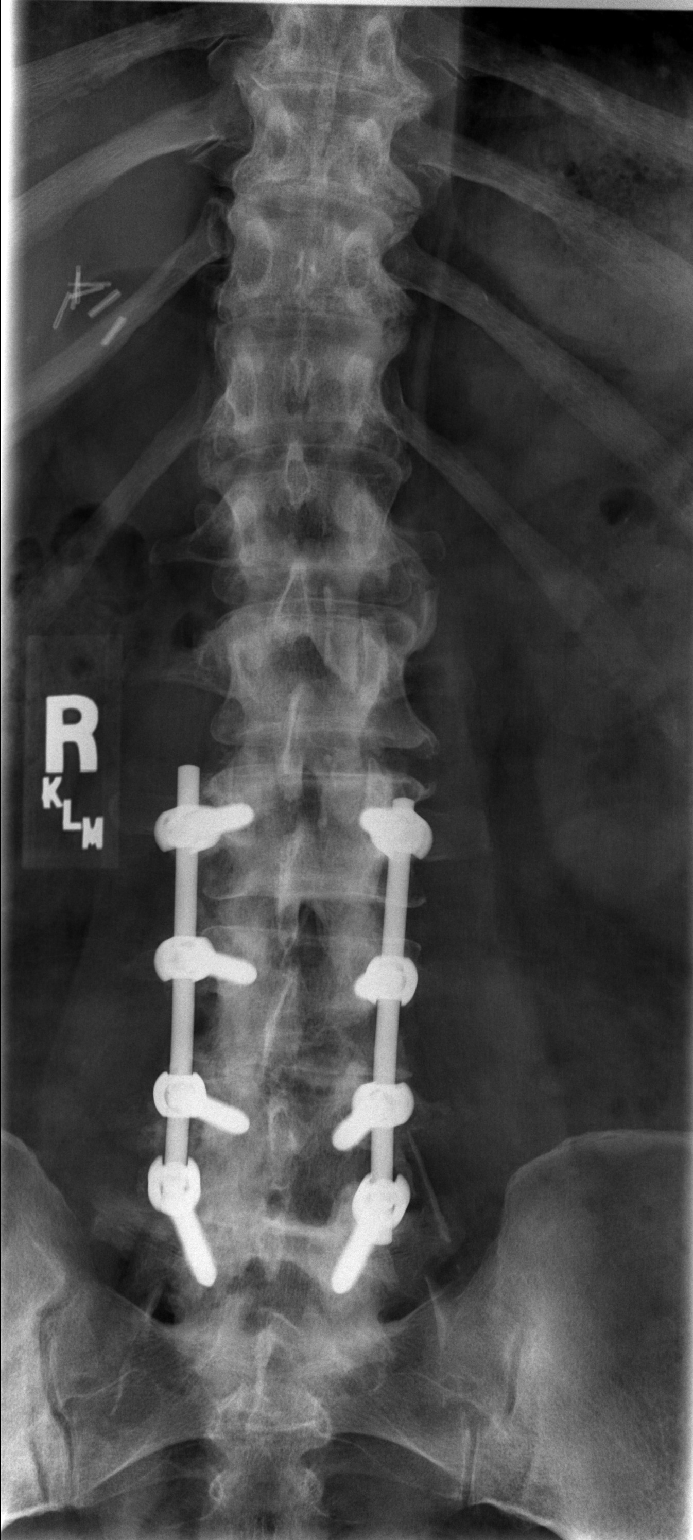

[t l-spine lat]
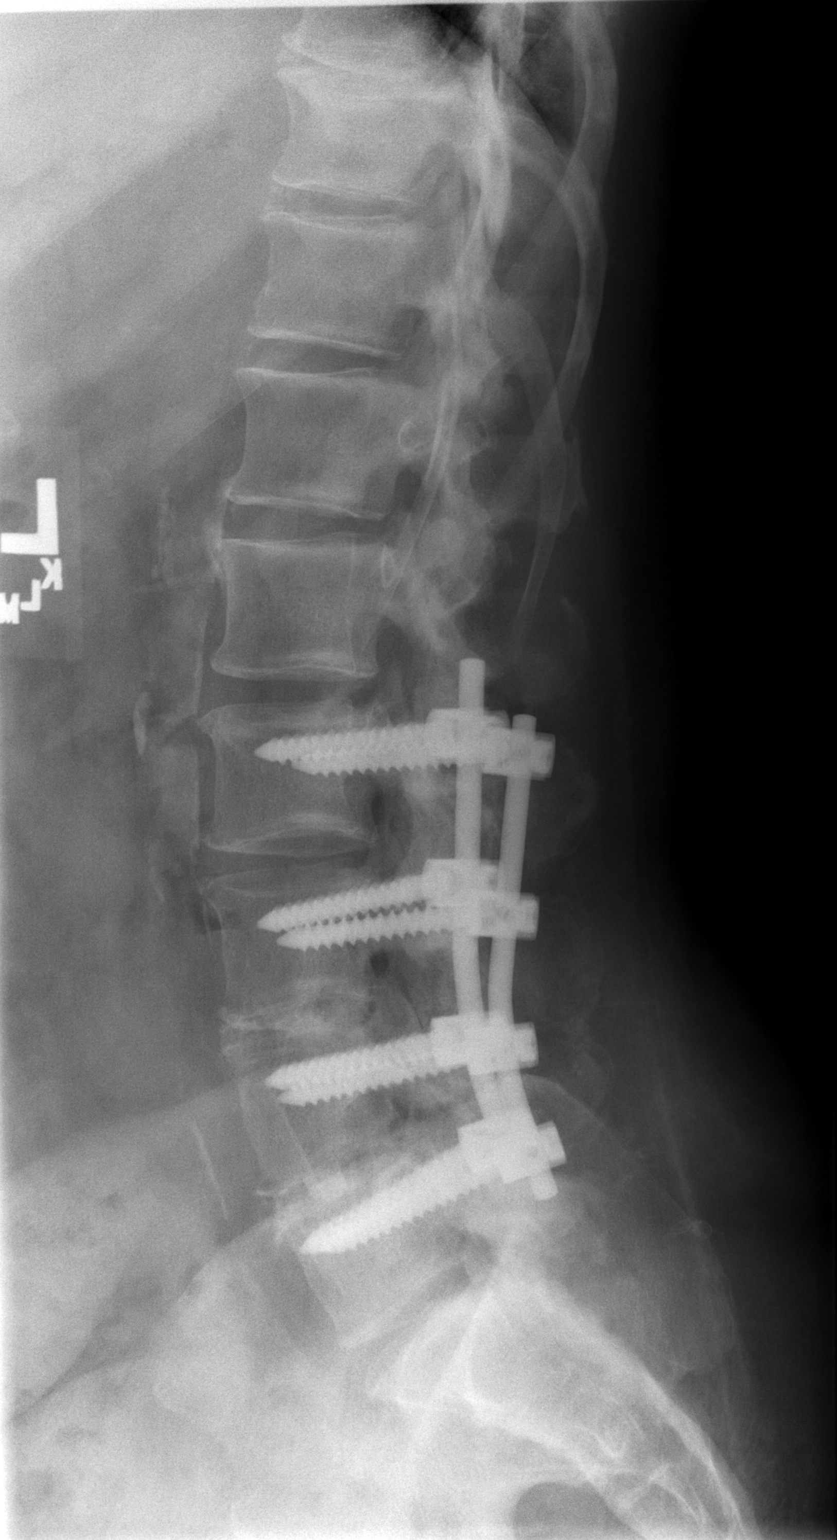

[t l-spine l5-s1 spot]
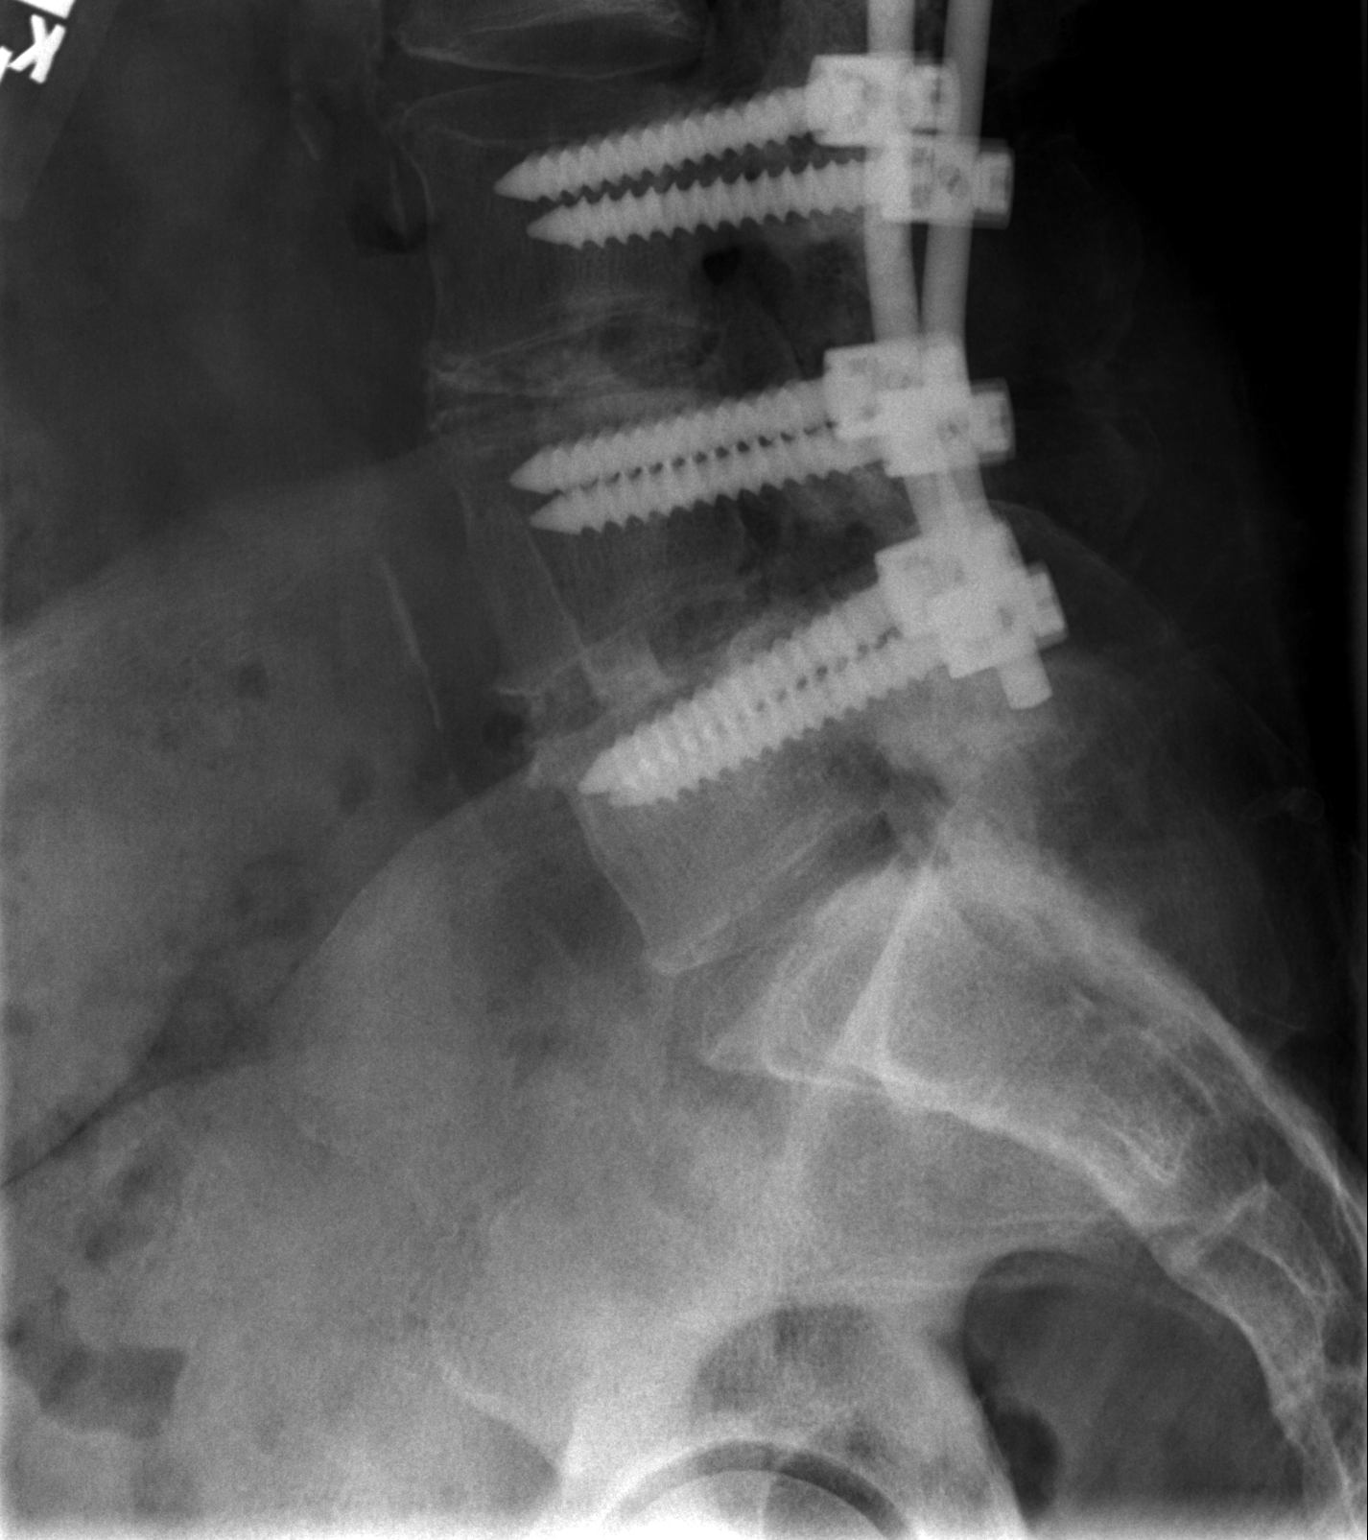

[3 of 3 positions shown; findings below may reference images not displayed]

FINDINGS: Intact paired Transpedicle screws at L2, L3, L4, and L5.
Intact interconnecting rods as well.  Interbody fusion material is
noted at L3-4 and L4-5.  No spondylolisthesis is noted.  No
fractures.  Sclerosis along the iliac aspect of the right SI joint
may be due to benign osteitis ilii.  Minimal sclerosis on the left
in comparison.
IMPRESSION: Stable postoperative changes L2 - L5.

## 2010-01-25 ENCOUNTER — Ambulatory Visit: Payer: Self-pay | Admitting: Vascular Surgery

## 2010-01-25 ENCOUNTER — Encounter (HOSPITAL_COMMUNITY): Admission: RE | Admit: 2010-01-25 | Discharge: 2010-02-24 | Payer: Self-pay | Admitting: Nephrology

## 2010-02-04 ENCOUNTER — Emergency Department (HOSPITAL_COMMUNITY): Admission: EM | Admit: 2010-02-04 | Discharge: 2010-02-04 | Payer: Self-pay | Admitting: Emergency Medicine

## 2010-02-07 ENCOUNTER — Encounter (HOSPITAL_COMMUNITY): Admission: RE | Admit: 2010-02-07 | Discharge: 2010-03-09 | Payer: Self-pay | Admitting: Oncology

## 2010-02-15 IMAGING — CR DG ABDOMEN ACUTE W/ 1V CHEST
3 series · 3 of 3 positions shown · non-contrast
Comparison: 01/07/2009

CLINICAL DATA: Abdominal pain.  Vomiting.  Previous myocardial
infarct.

ACUTE ABDOMEN SERIES (ABDOMEN 2 VIEW & CHEST 1 VIEW)

[view not recorded (1 of 3)]
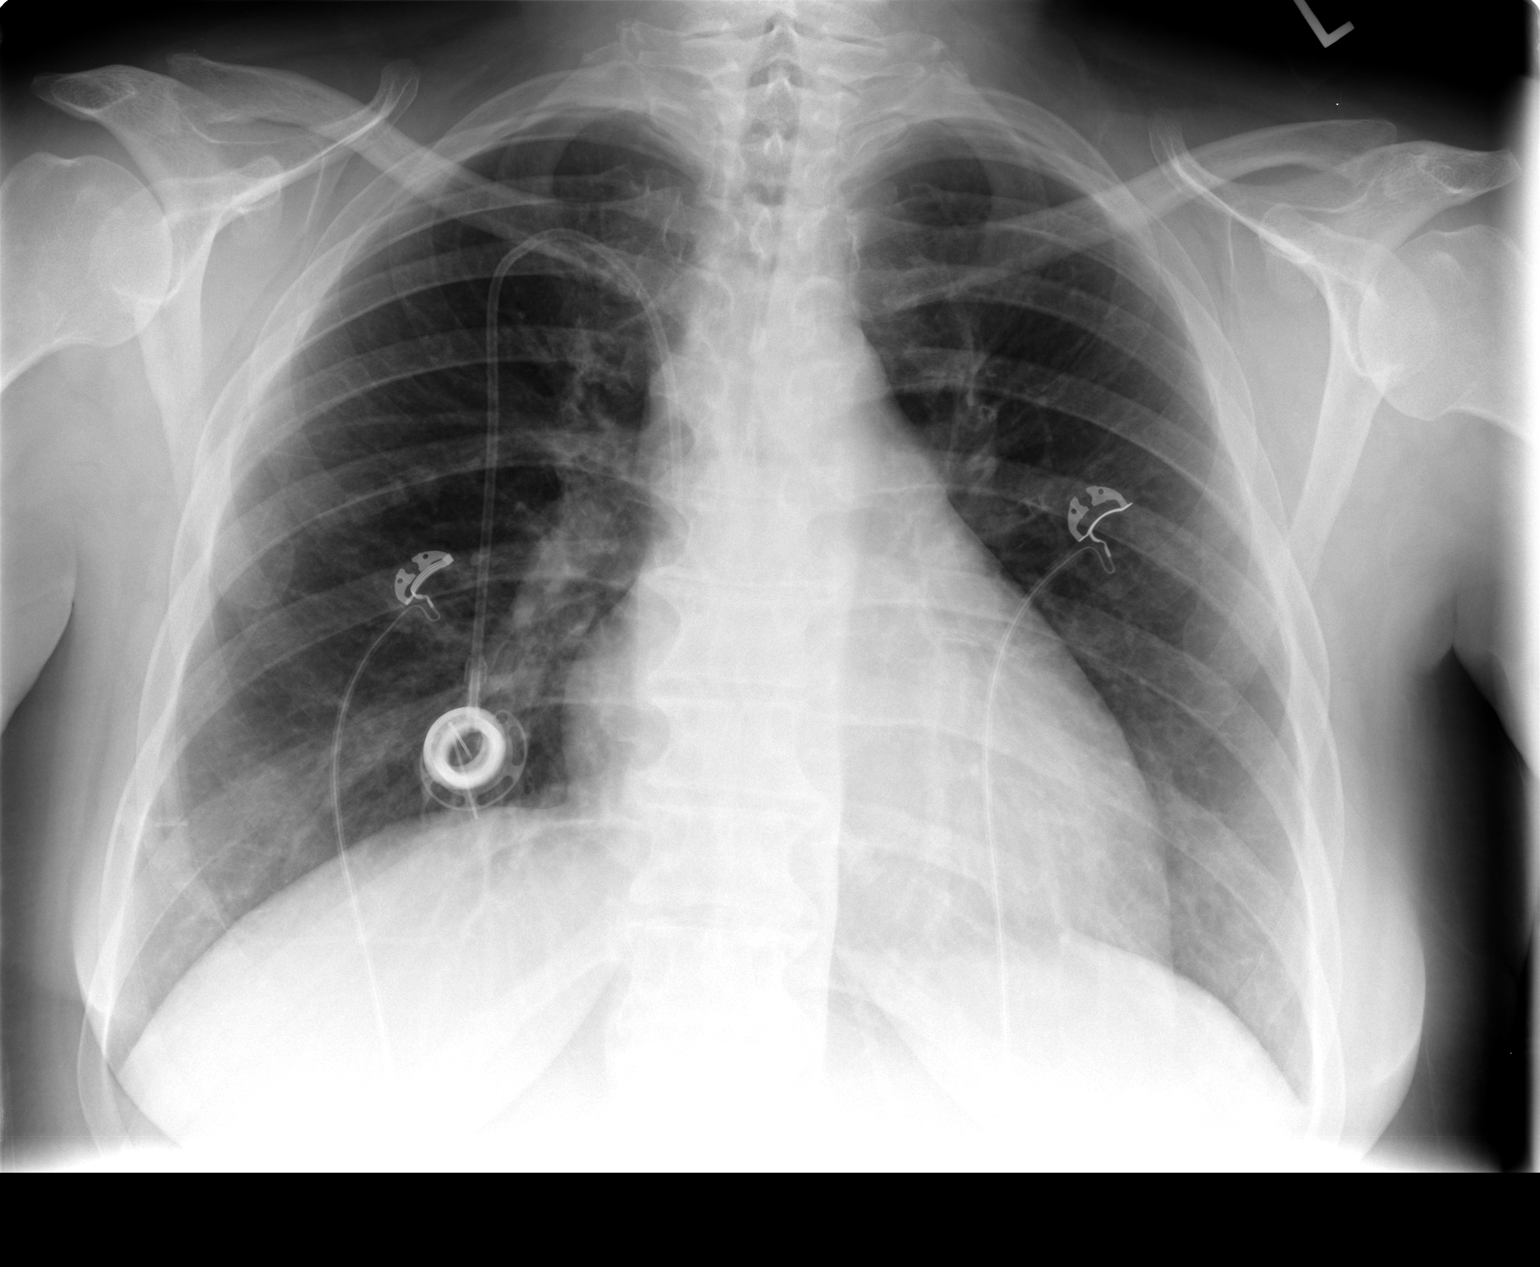

[view not recorded (2 of 3)]
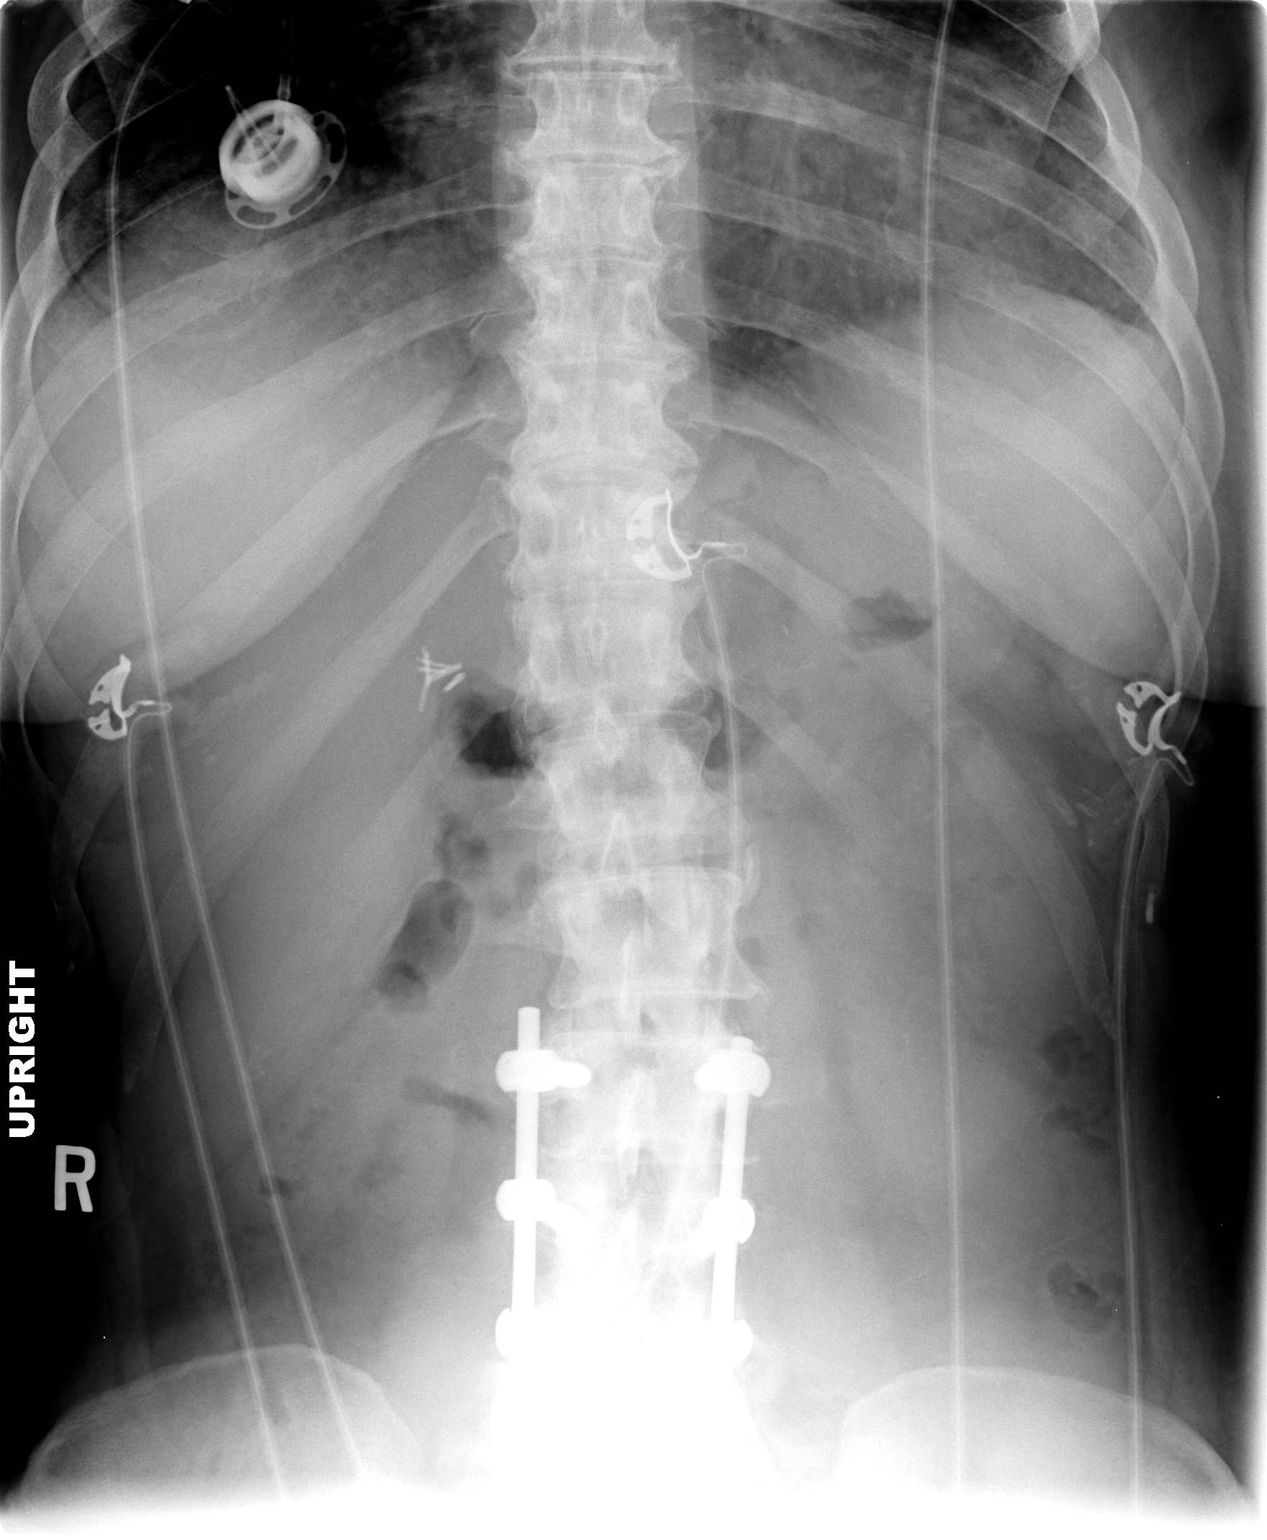

[view not recorded (3 of 3)]
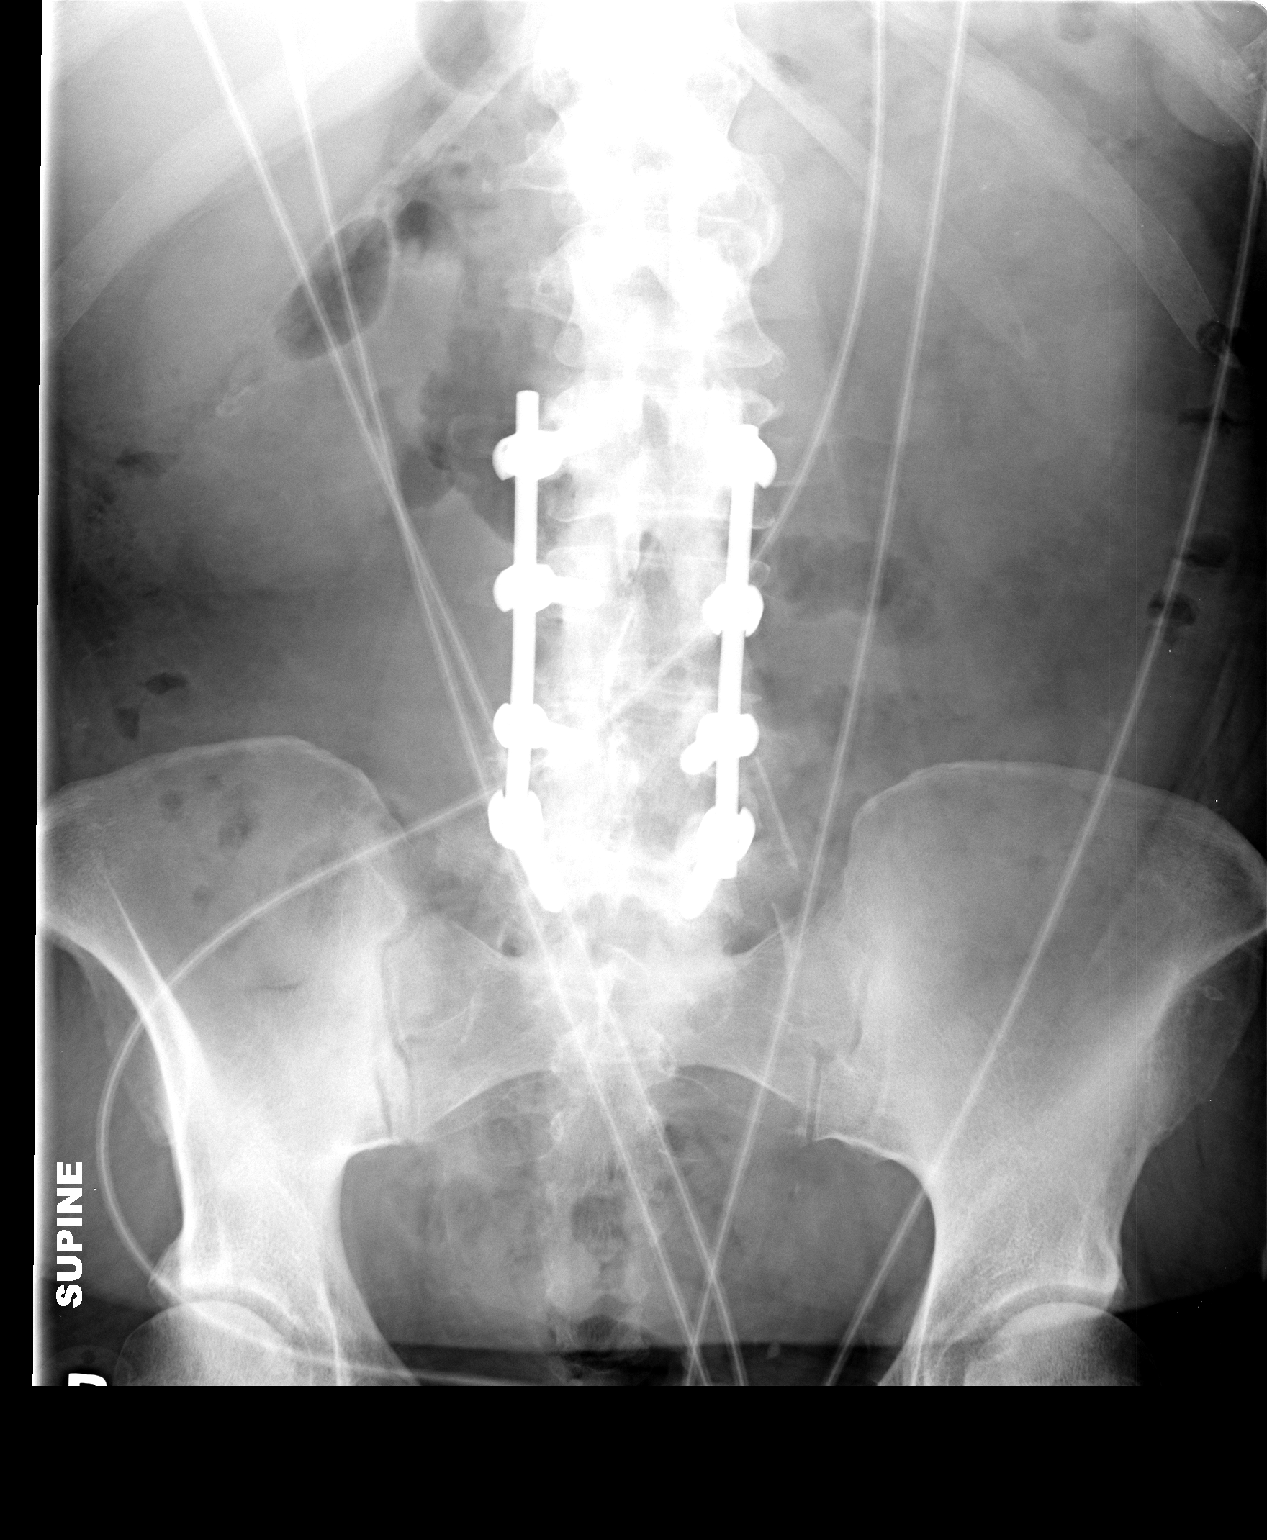

[3 of 3 positions shown; findings below may reference images not displayed]

FINDINGS: The bowel gas pattern is normal.  There is no evidence of
free air.  Posterior lumbar spine fusion hardware is noted.  No
radiopaque calculi identified.

Heart size is within normal limits.  Both lungs are clear.  Right-
sided Port-A-Cath remains in appropriate position.
IMPRESSION: No acute findings.

## 2010-02-19 IMAGING — US US EXTREM LOW VENOUS BILAT
1 series · 14 of 24 positions shown · non-contrast
Comparison: None

CLINICAL DATA: Bilateral lower extremity swelling, pulmonary
embolism, history diabetes, CHF

VENOUS DUPLEX ULTRASOUND OF BILATERAL LOWER EXTREMITIES
TECHNIQUE: Gray-scale sonography with graded compression, as well
as color Doppler and duplex ultrasound, were performed to evaluate
the deep venous system of both lower extremities from the level of
the common femoral vein through the popliteal and proximal calf
veins. Spectral Doppler was utilized to evaluate flow at rest and
with distal augmentation maneuvers.

[Series 1: unknown · 14 of 62 slices shown]
[im 1/62]
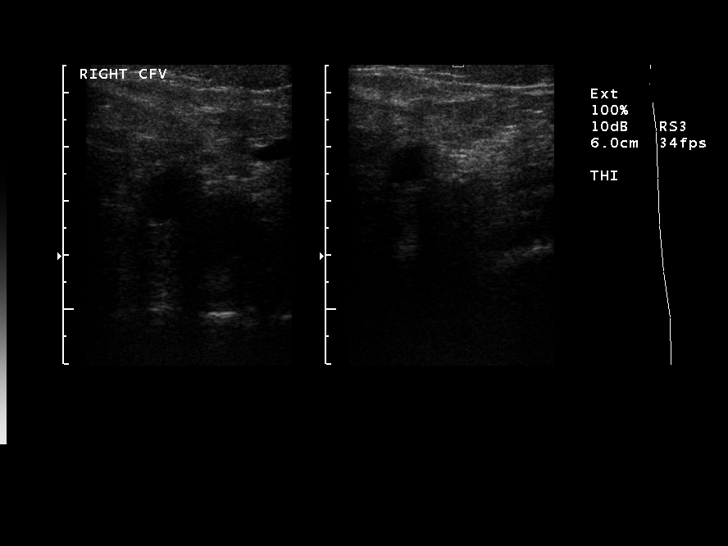
[im 6/62]
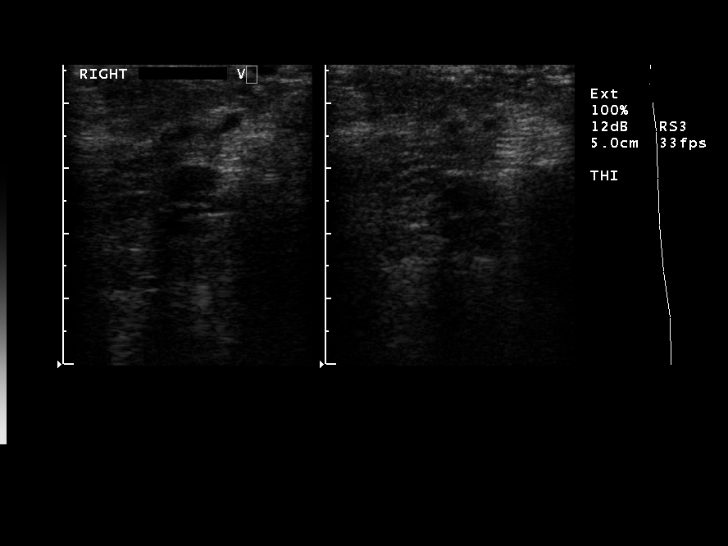
[im 11/62]
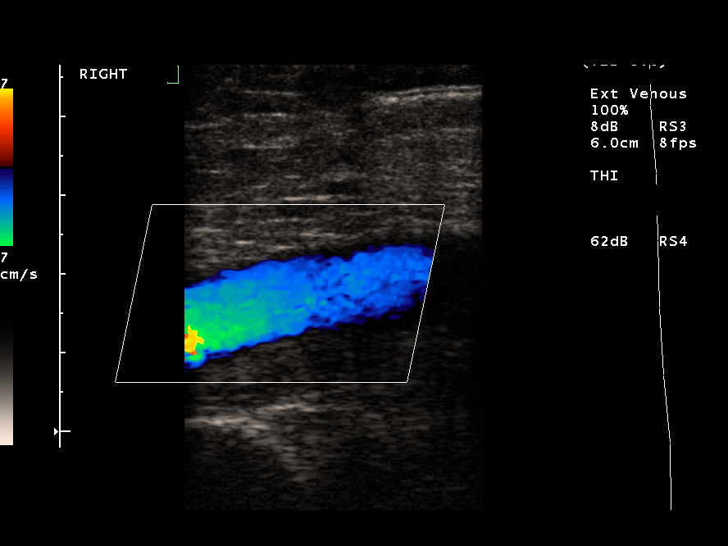
[im 16/62]
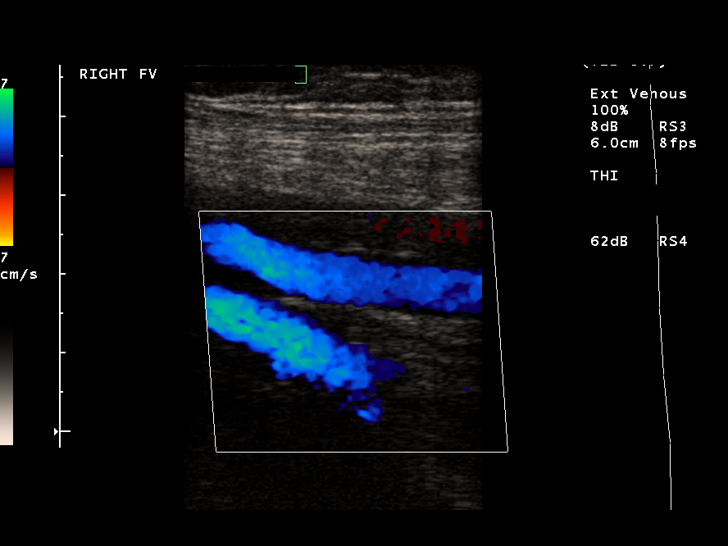
[im 19/62]
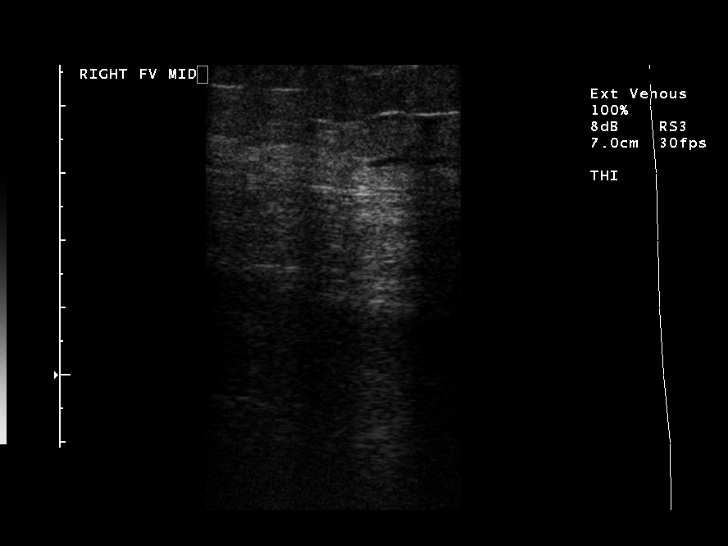
[im 24/62]
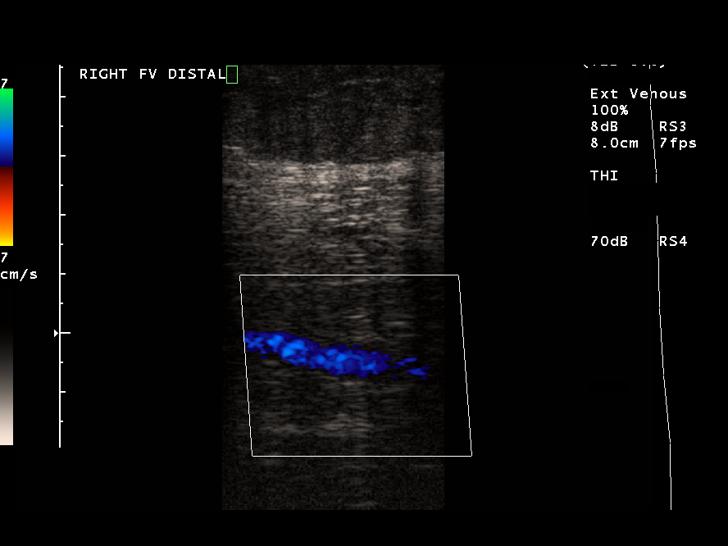
[im 30/62]
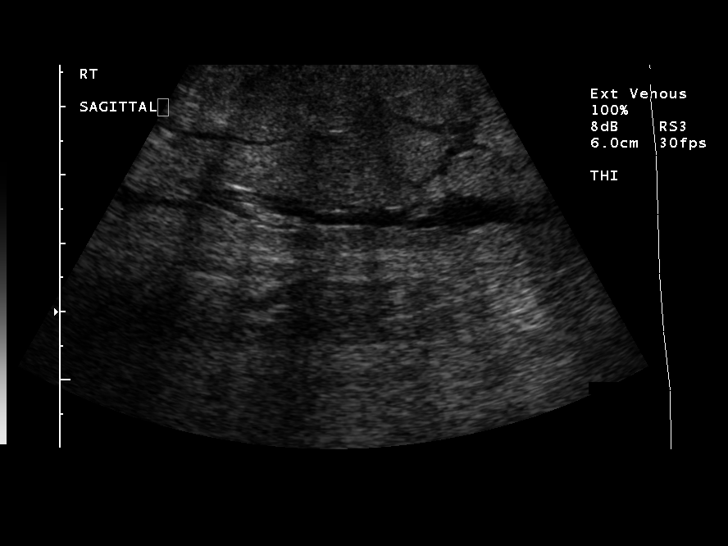
[im 32/62]
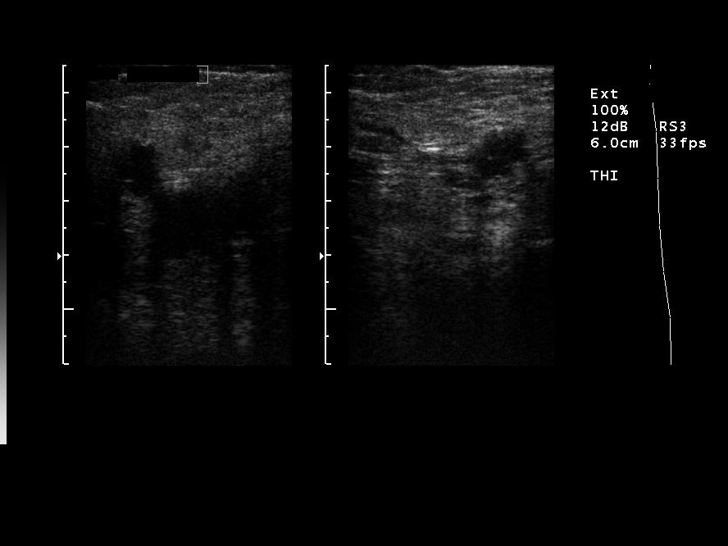
[im 38/62]
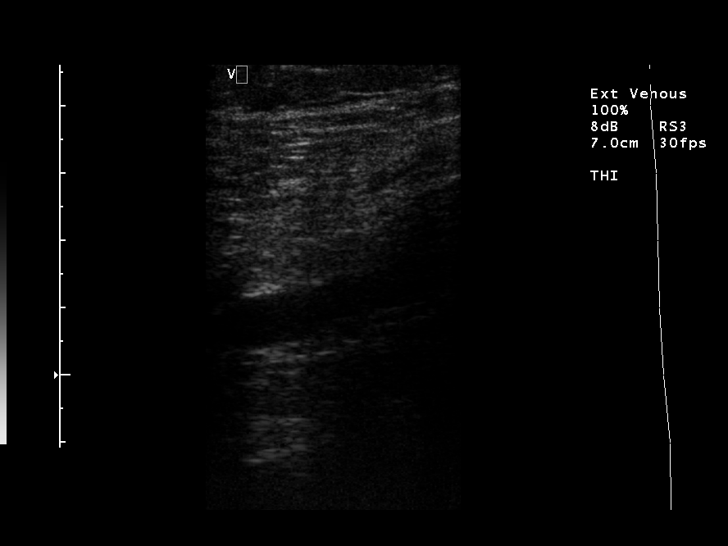
[im 43/62]
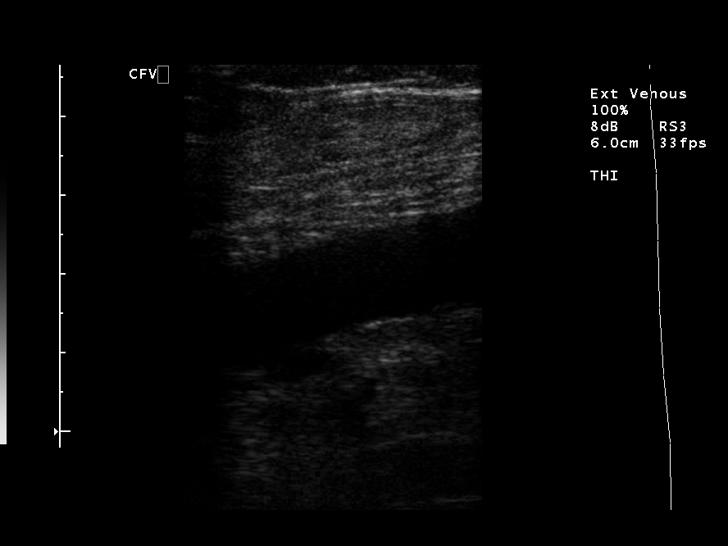
[im 48/62]
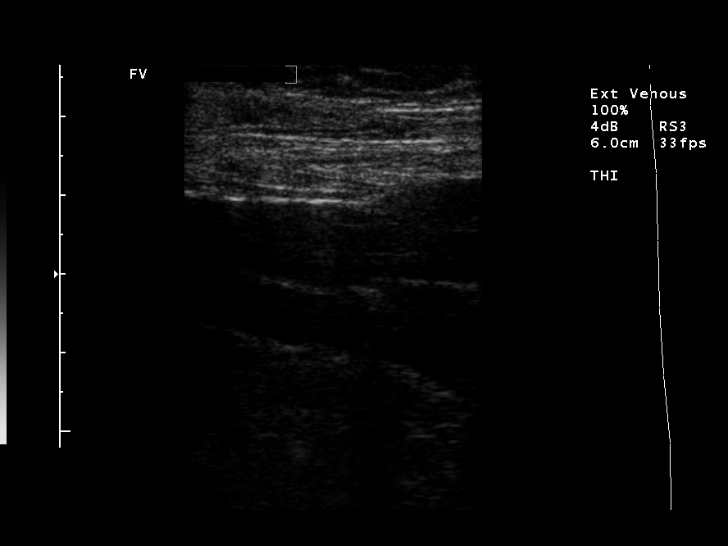
[im 51/62]
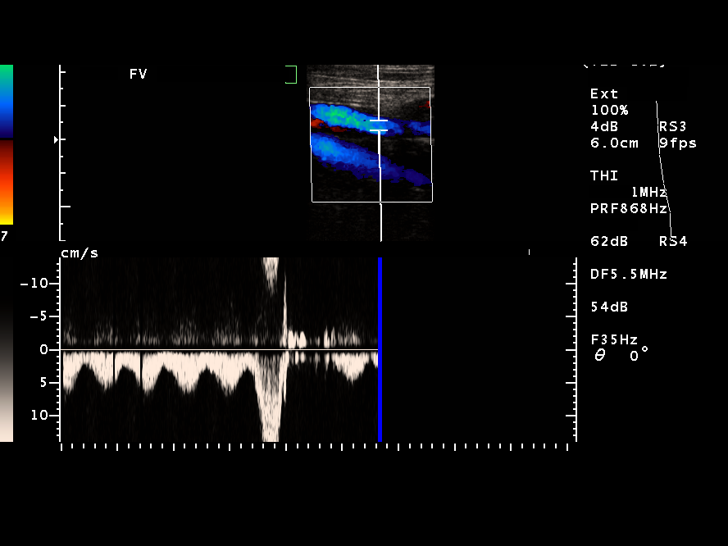
[im 56/62]
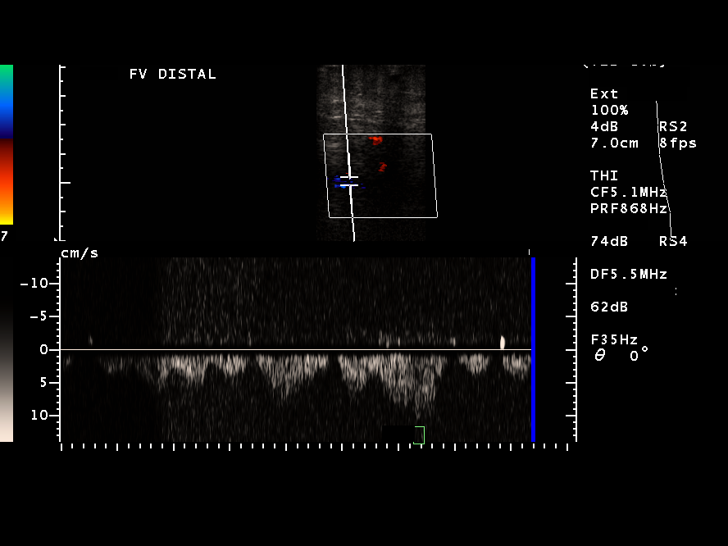
[im 62/62]
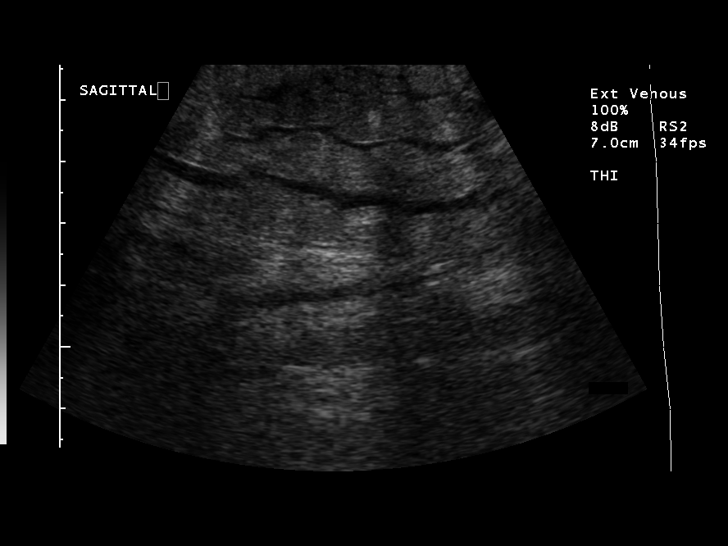

[14 of 24 positions shown; findings below may reference images not displayed]

FINDINGS: Deep venous system appears patent and compressible from groin to
popliteal fossa bilaterally.
Spontaneous venous flow is present with intact augmentation.
Venous flow on both lower extremities is pulsatile suggesting
elevated right heart pressures.
No intraluminal thrombus identified.
Subcutaneous edema is identified in both lower extremities.
IMPRESSION: No evidence of deep venous thrombosis in the lower extremities.
Subcutaneous edema with pulsatile venous flow, suggesting elevated
right heart pressures.

## 2010-02-20 IMAGING — CR DG CHEST 2V
2 series · 2 of 2 positions shown · non-contrast
Comparison: Chest x-ray of 05/26/2008

CLINICAL DATA: Short of breath, nausea and vomiting

CHEST - 2 VIEW

[view not recorded (1 of 2)]
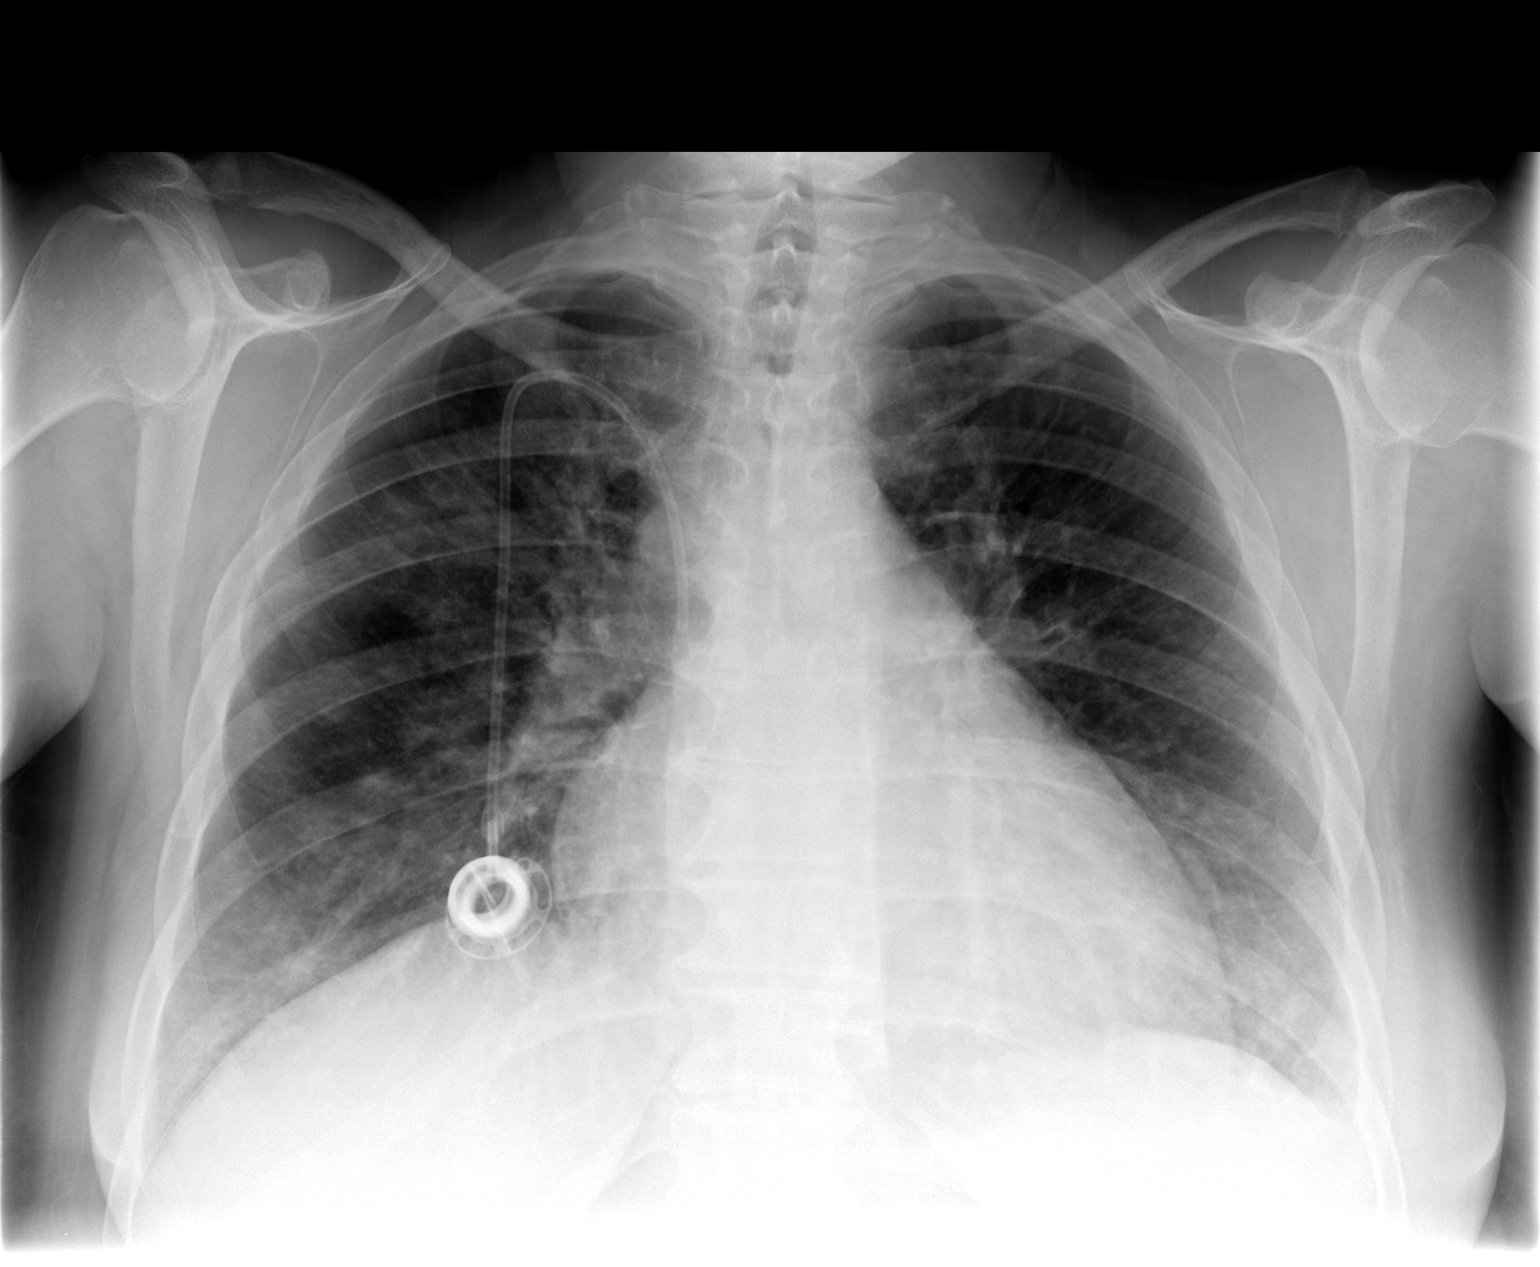

[view not recorded (2 of 2)]
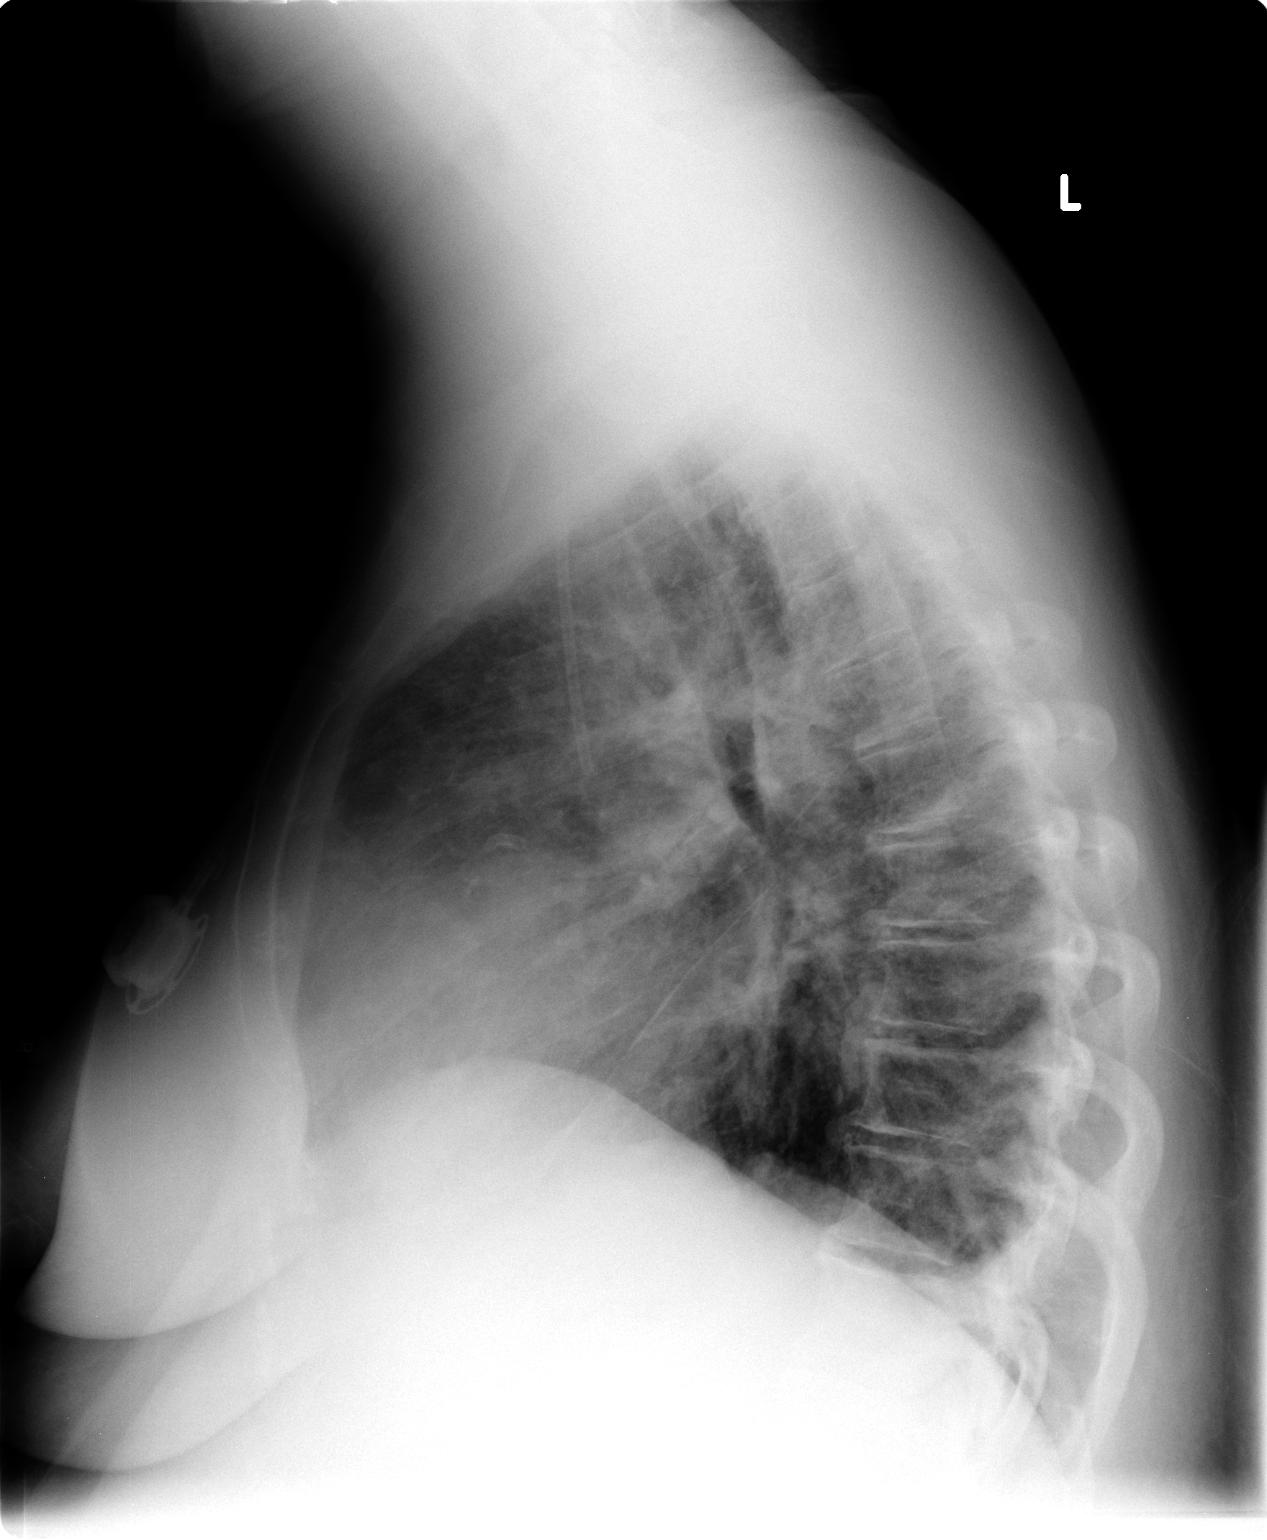

[2 of 2 positions shown; findings below may reference images not displayed]

FINDINGS: There are patchy lung opacities primarily in the right
mid lung and right perihilar region suspicious for pneumonia.
There may be tiny effusion blunting the left costophrenic angle
posteriorly.  Cardiomegaly is present.  Port-A-Cath is unchanged.
IMPRESSION: New airspace disease primarily on the right suspicious for
pneumonia.  Probable small left effusion.

## 2010-02-22 ENCOUNTER — Ambulatory Visit (HOSPITAL_COMMUNITY): Payer: Self-pay | Admitting: Nephrology

## 2010-02-23 IMAGING — CR DG CHEST 1V PORT
1 series · 1 of 1 positions shown · non-contrast
Comparison: 02/22/2009

CLINICAL DATA: Worsening shortness of breath.  Nausea and vomiting.

PORTABLE CHEST - 1 VIEW

[view not recorded]
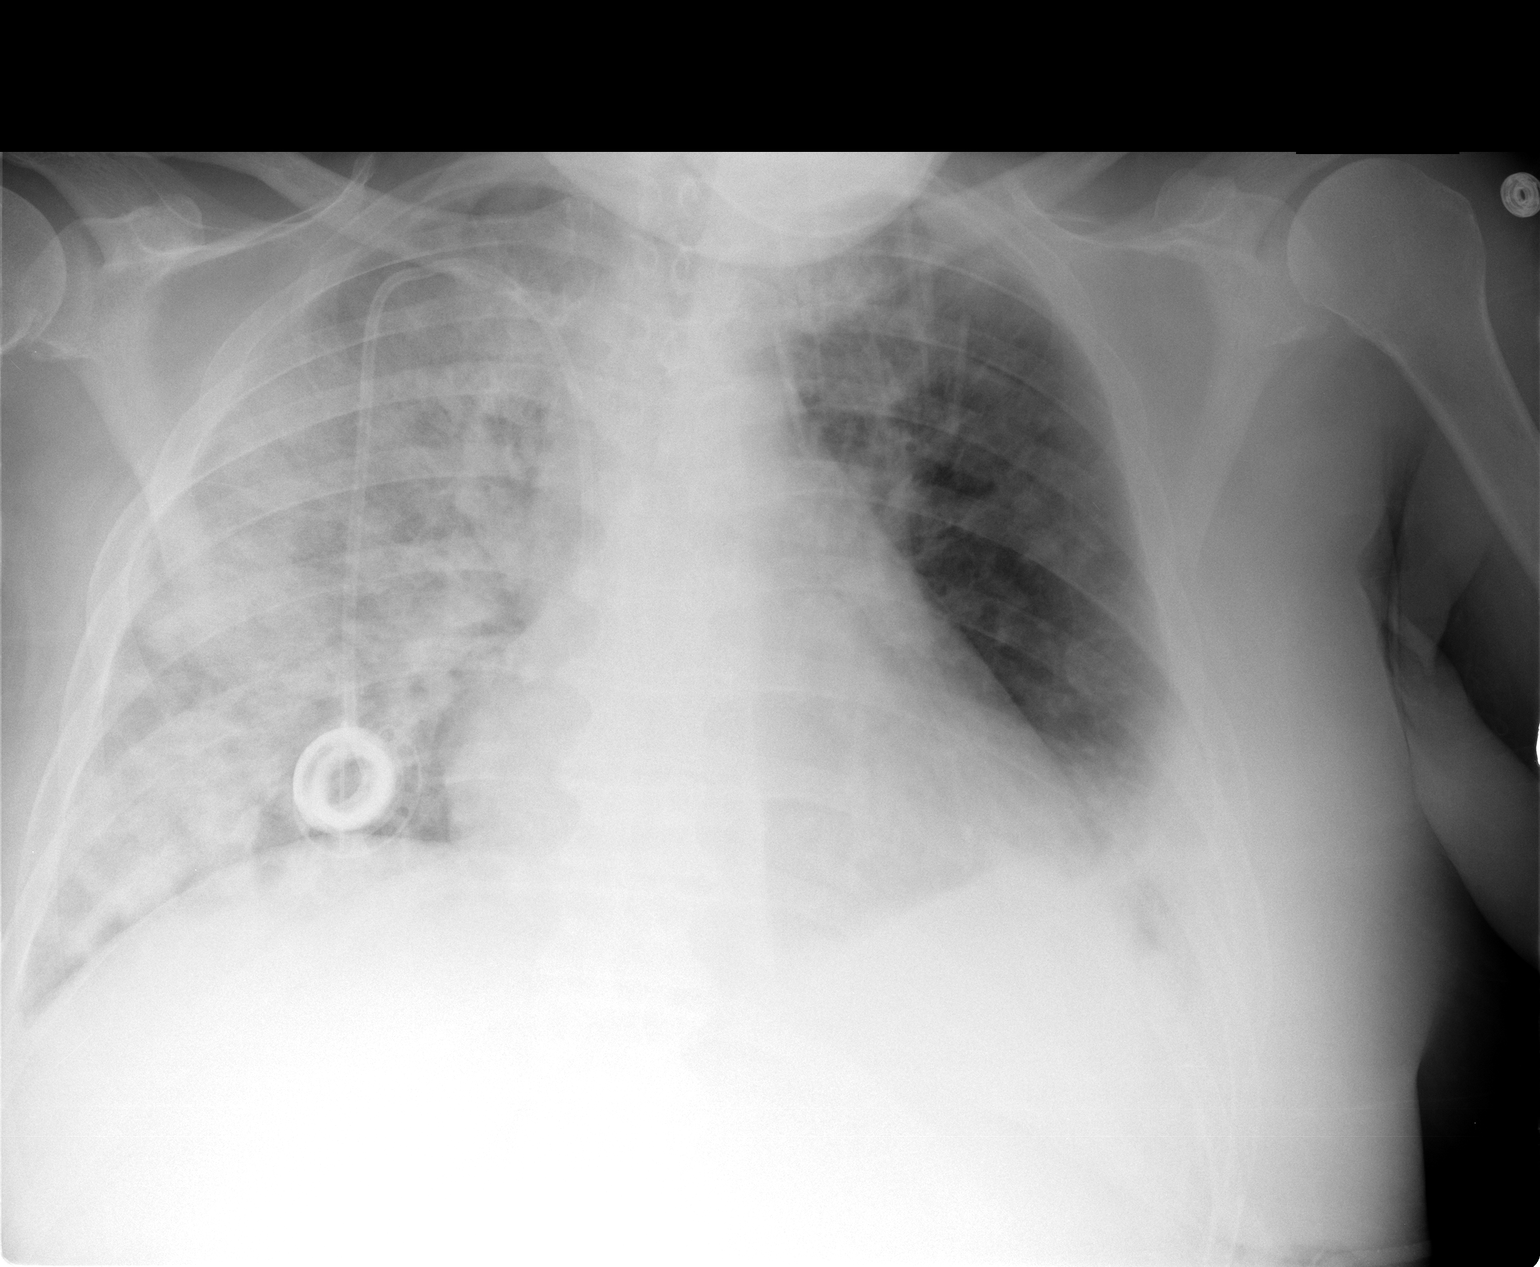

[1 of 1 positions shown; findings below may reference images not displayed]

FINDINGS: Worsening airspace disease is seen throughout the right
lung.  Increased airspace disease is also seen in the left upper
lobe and lateral left lung base.

Heart size is stable.  Right-sided Port-A-Cath remains in
appropriate position.
IMPRESSION: Worsening asymmetric airspace disease, involving right lung greater
than left.

## 2010-02-24 ENCOUNTER — Inpatient Hospital Stay (HOSPITAL_COMMUNITY): Admission: EM | Admit: 2010-02-24 | Discharge: 2010-02-26 | Payer: Self-pay | Admitting: Emergency Medicine

## 2010-02-25 IMAGING — CR DG CHEST 2V
2 series · 2 of 2 positions shown · non-contrast
Comparison: 02/25/2009.

CLINICAL DATA: Nausea and vomiting.  Short of breath.

CHEST - 2 VIEW

[view not recorded (1 of 2)]
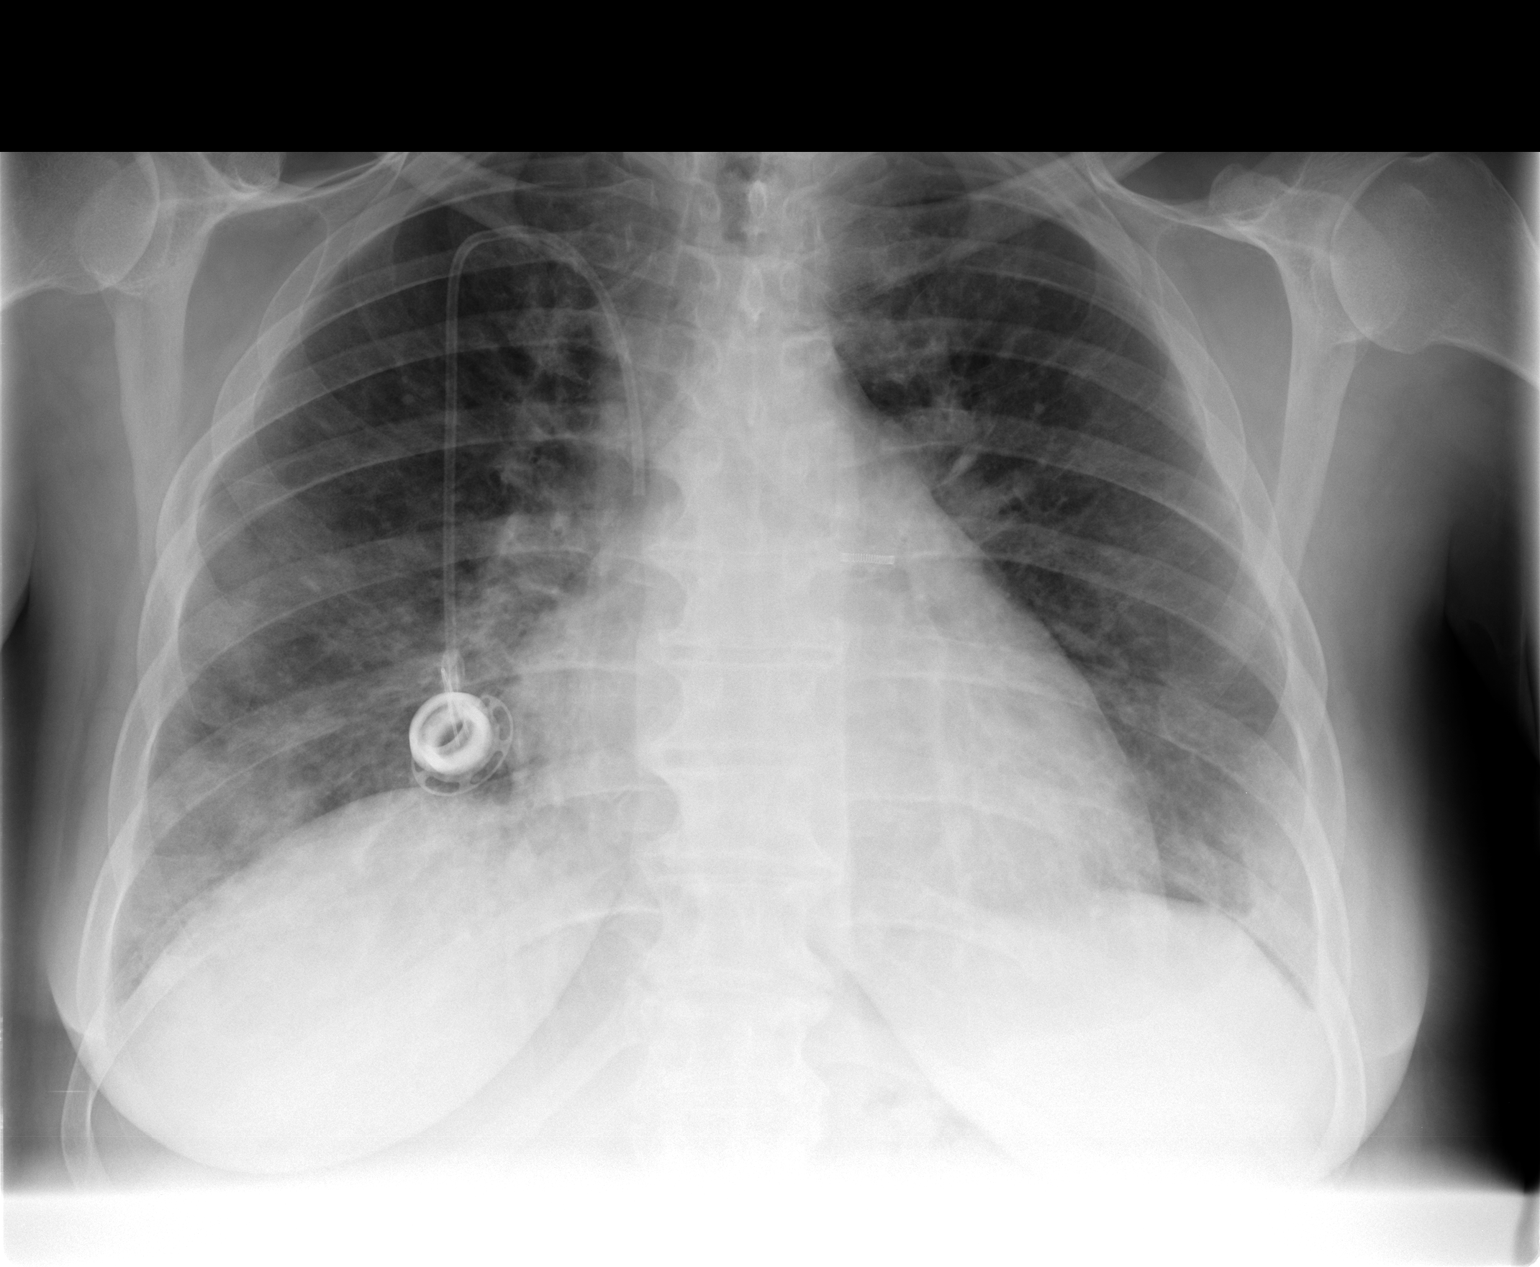

[view not recorded (2 of 2)]
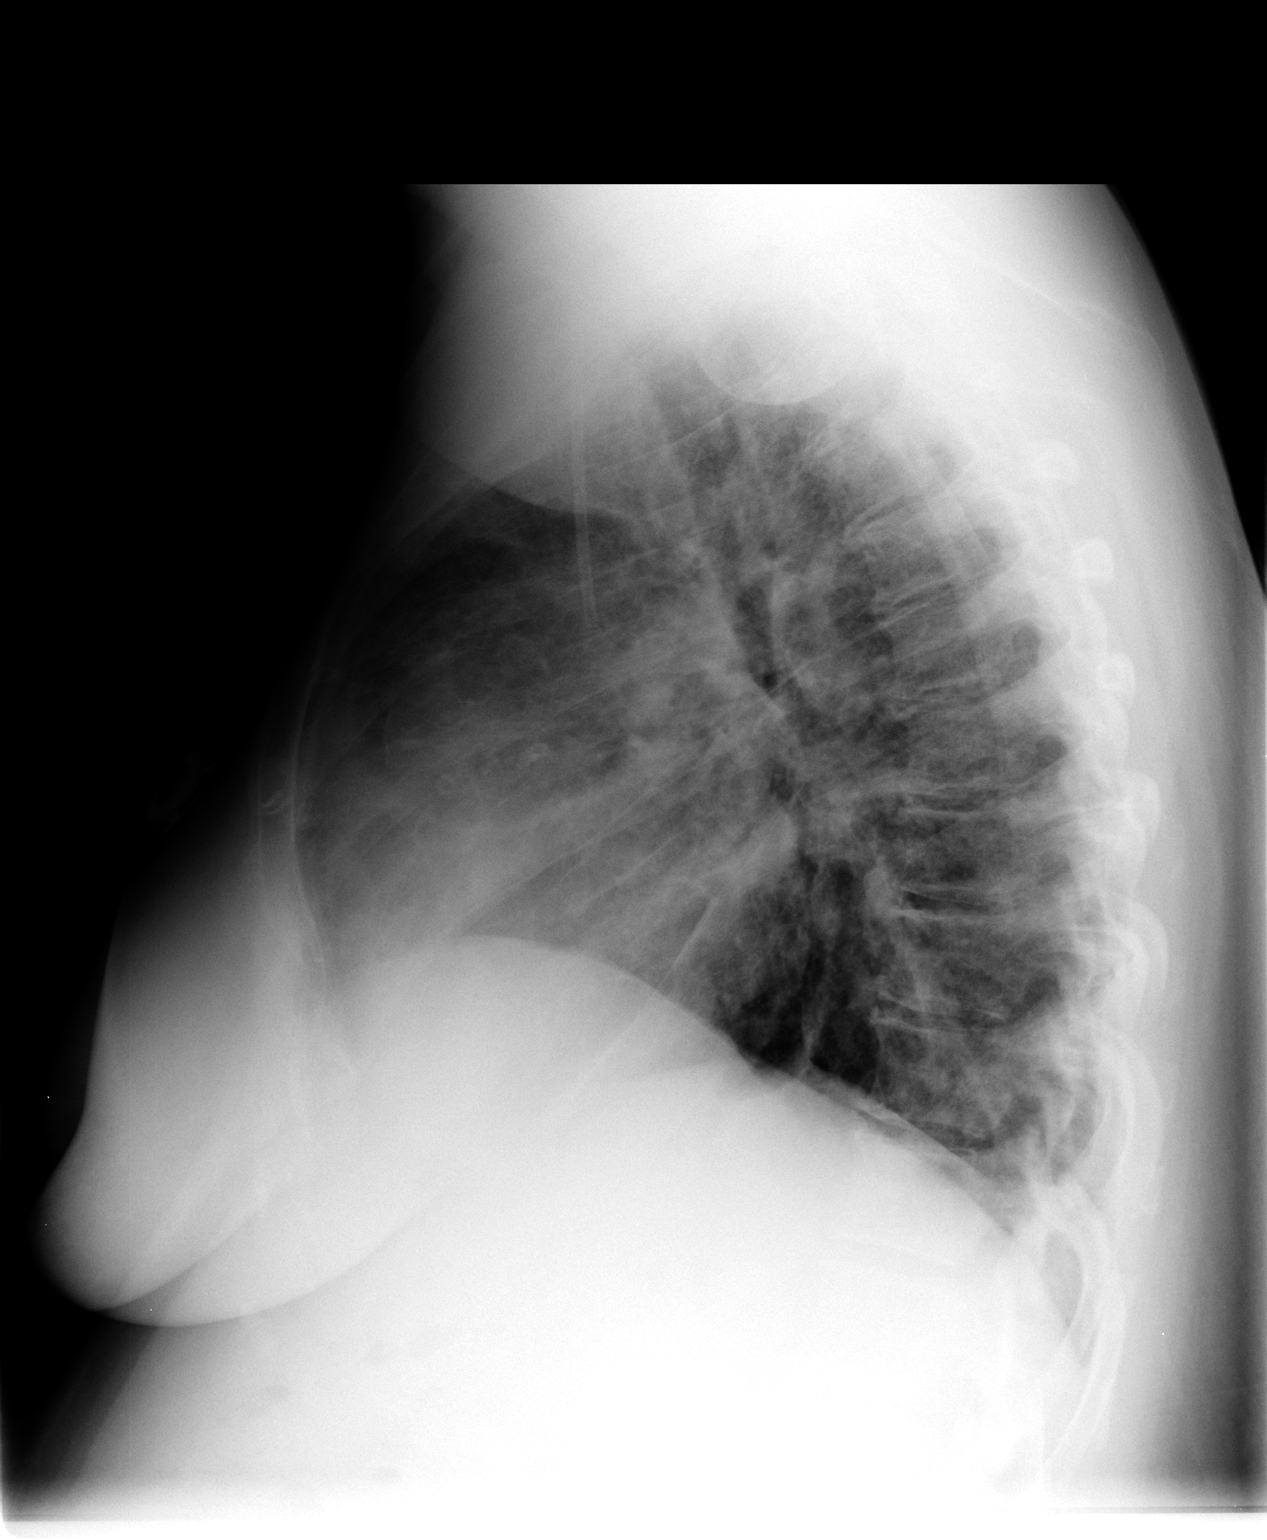

[2 of 2 positions shown; findings below may reference images not displayed]

FINDINGS: Right subclavian Port-A-Cath is present.  Airspace
disease is improved compared to the prior exam, with marked
improvement of the right hemithorax.  Minimal left pleural
effusion, decreased from prior.  Cardiomegaly persists.
IMPRESSION: 1.  Markedly improved aeration with mild persistent airspace
disease consistent with pulmonary edema.

## 2010-03-03 IMAGING — CR DG CHEST 2V
2 series · 2 of 2 positions shown · non-contrast
Comparison: 02/27/2009

CLINICAL DATA: Pneumonia.

CHEST - 2 VIEW

[view not recorded (1 of 2)]
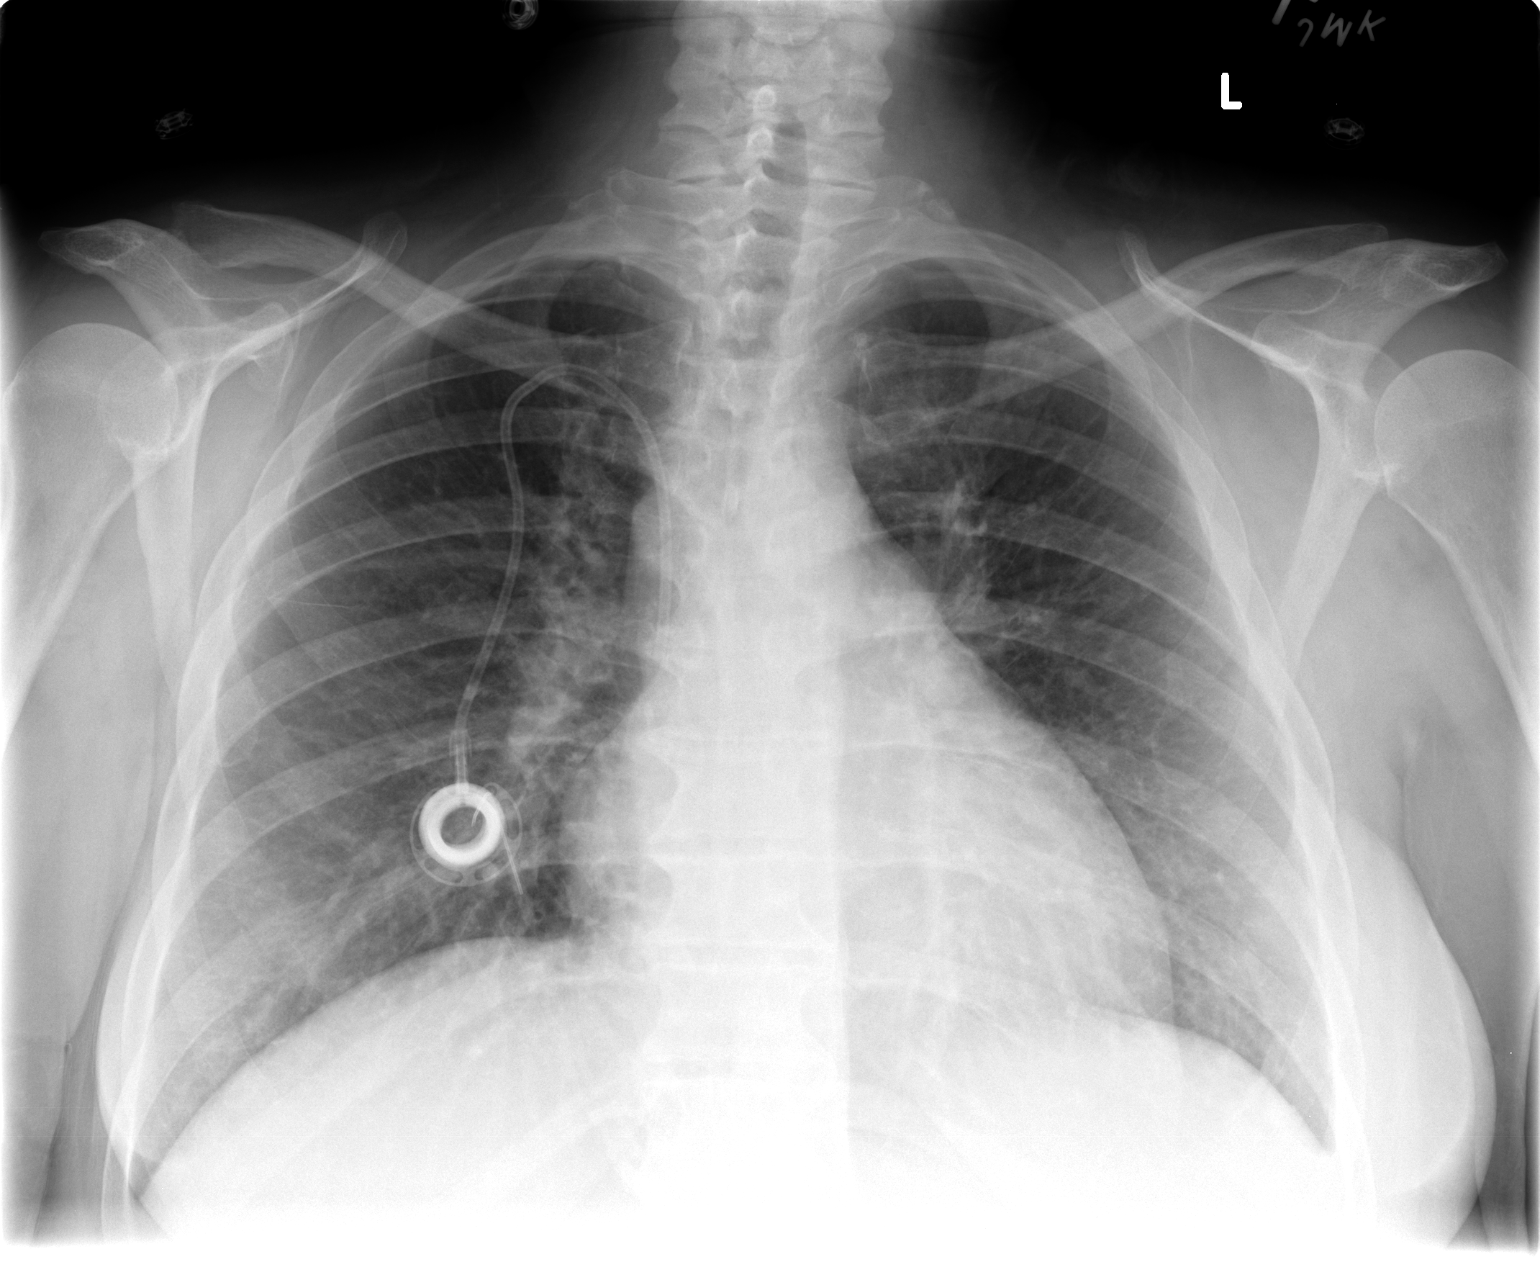

[view not recorded (2 of 2)]
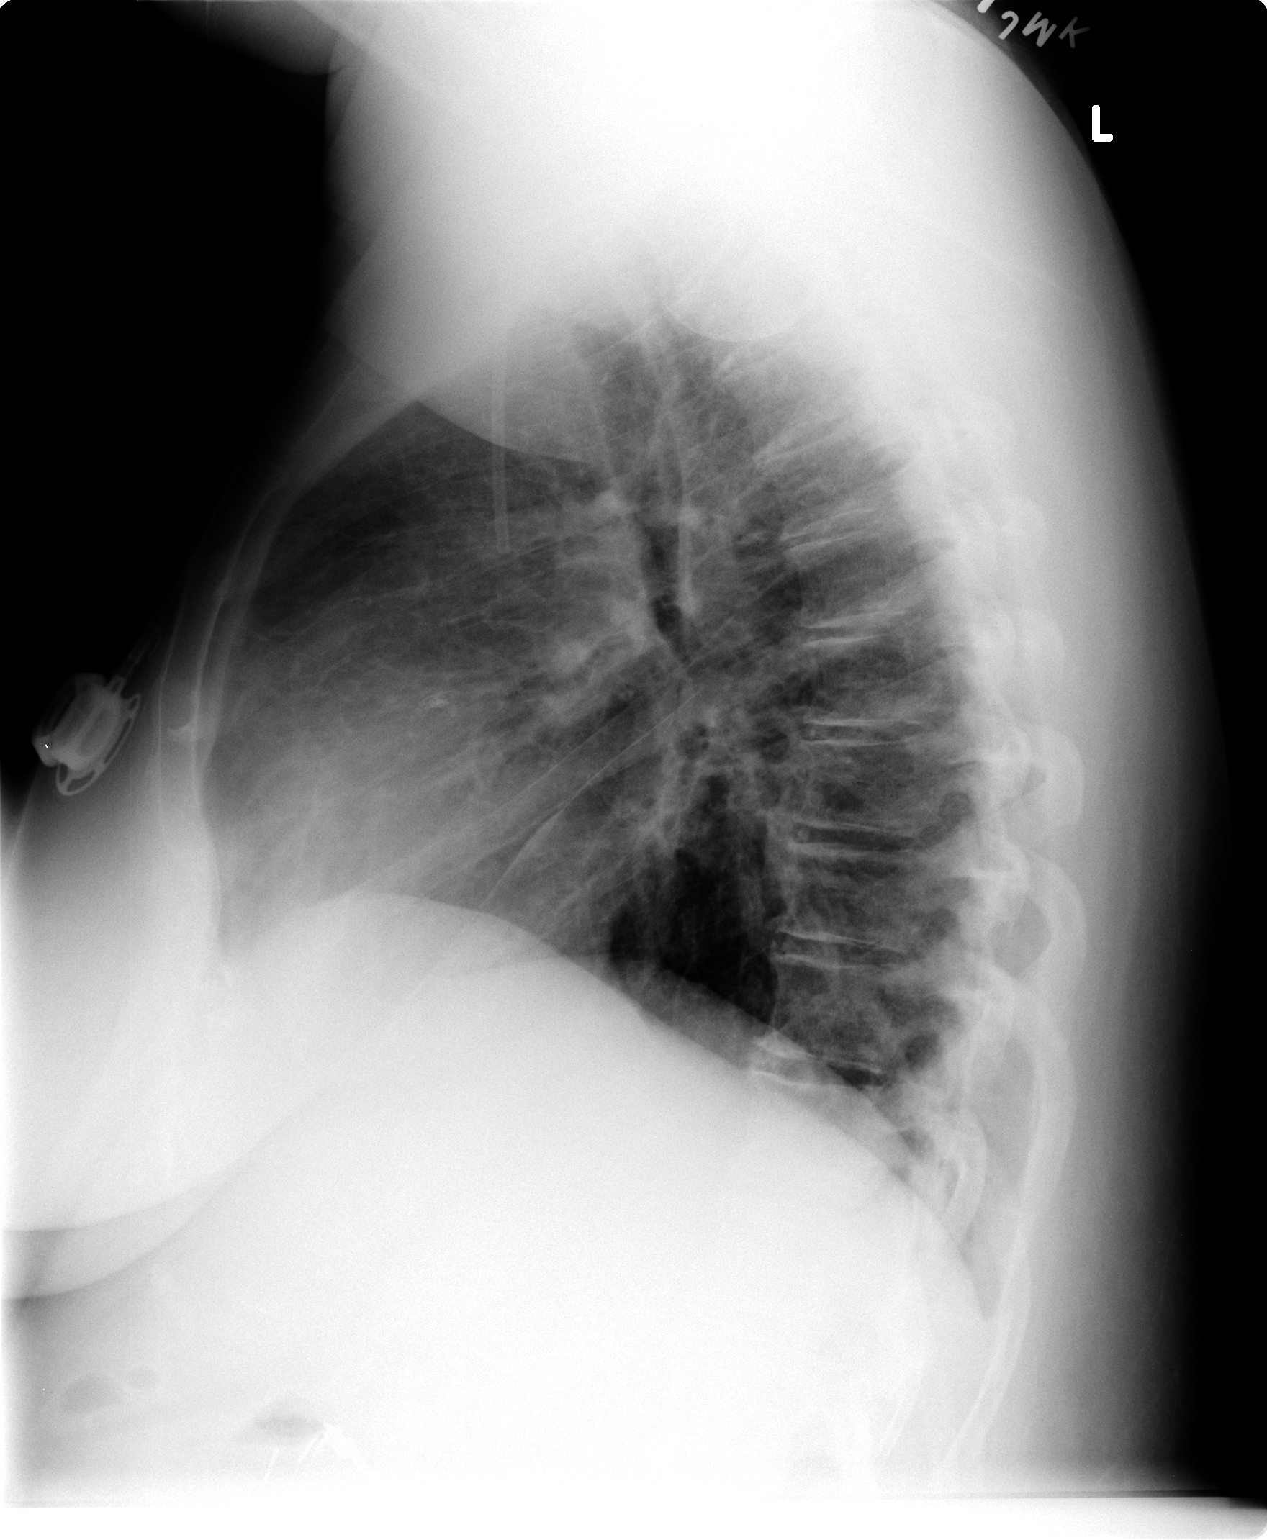

[2 of 2 positions shown; findings below may reference images not displayed]

FINDINGS: Two views of the chest demonstrate a right Port-A-Cath
with the tip in the SVC.  The cardiac silhouette is within normal
limits.  There is improved aeration in both lungs with resolution
of the vague airspace densities.  No evidence for pleural
effusions.  Bony structures are intact.
IMPRESSION: Improved aeration in both lungs without significant airspace or
interstitial disease.

## 2010-03-07 ENCOUNTER — Encounter (HOSPITAL_COMMUNITY): Admission: RE | Admit: 2010-03-07 | Discharge: 2010-04-06 | Payer: Self-pay | Admitting: Nephrology

## 2010-03-07 ENCOUNTER — Ambulatory Visit: Payer: Self-pay | Admitting: Vascular Surgery

## 2010-03-19 IMAGING — CR DG HIP (WITH OR WITHOUT PELVIS) 2-3V*L*
3 series · 3 of 3 positions shown · non-contrast
Comparison: None

CLINICAL DATA: Left hip pain.

LEFT HIP - COMPLETE 2+ VIEW

[view not recorded (1 of 3)]
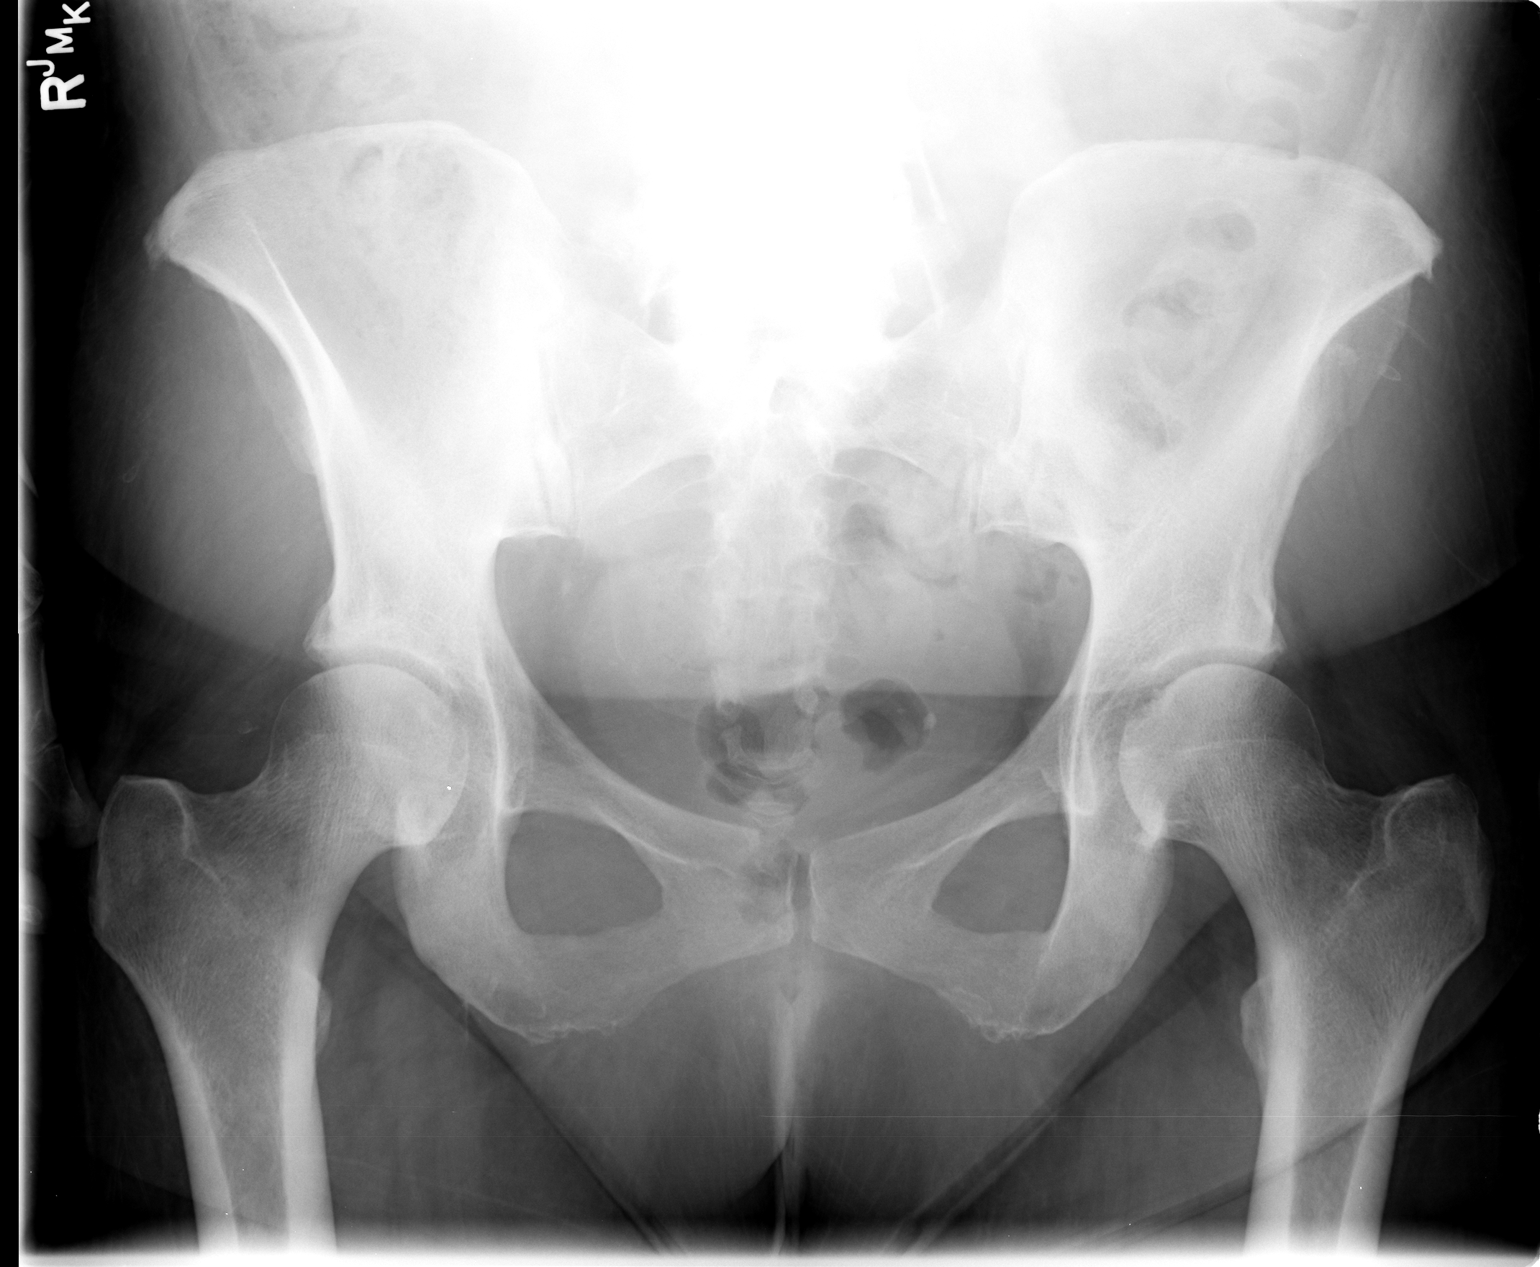

[view not recorded (2 of 3)]
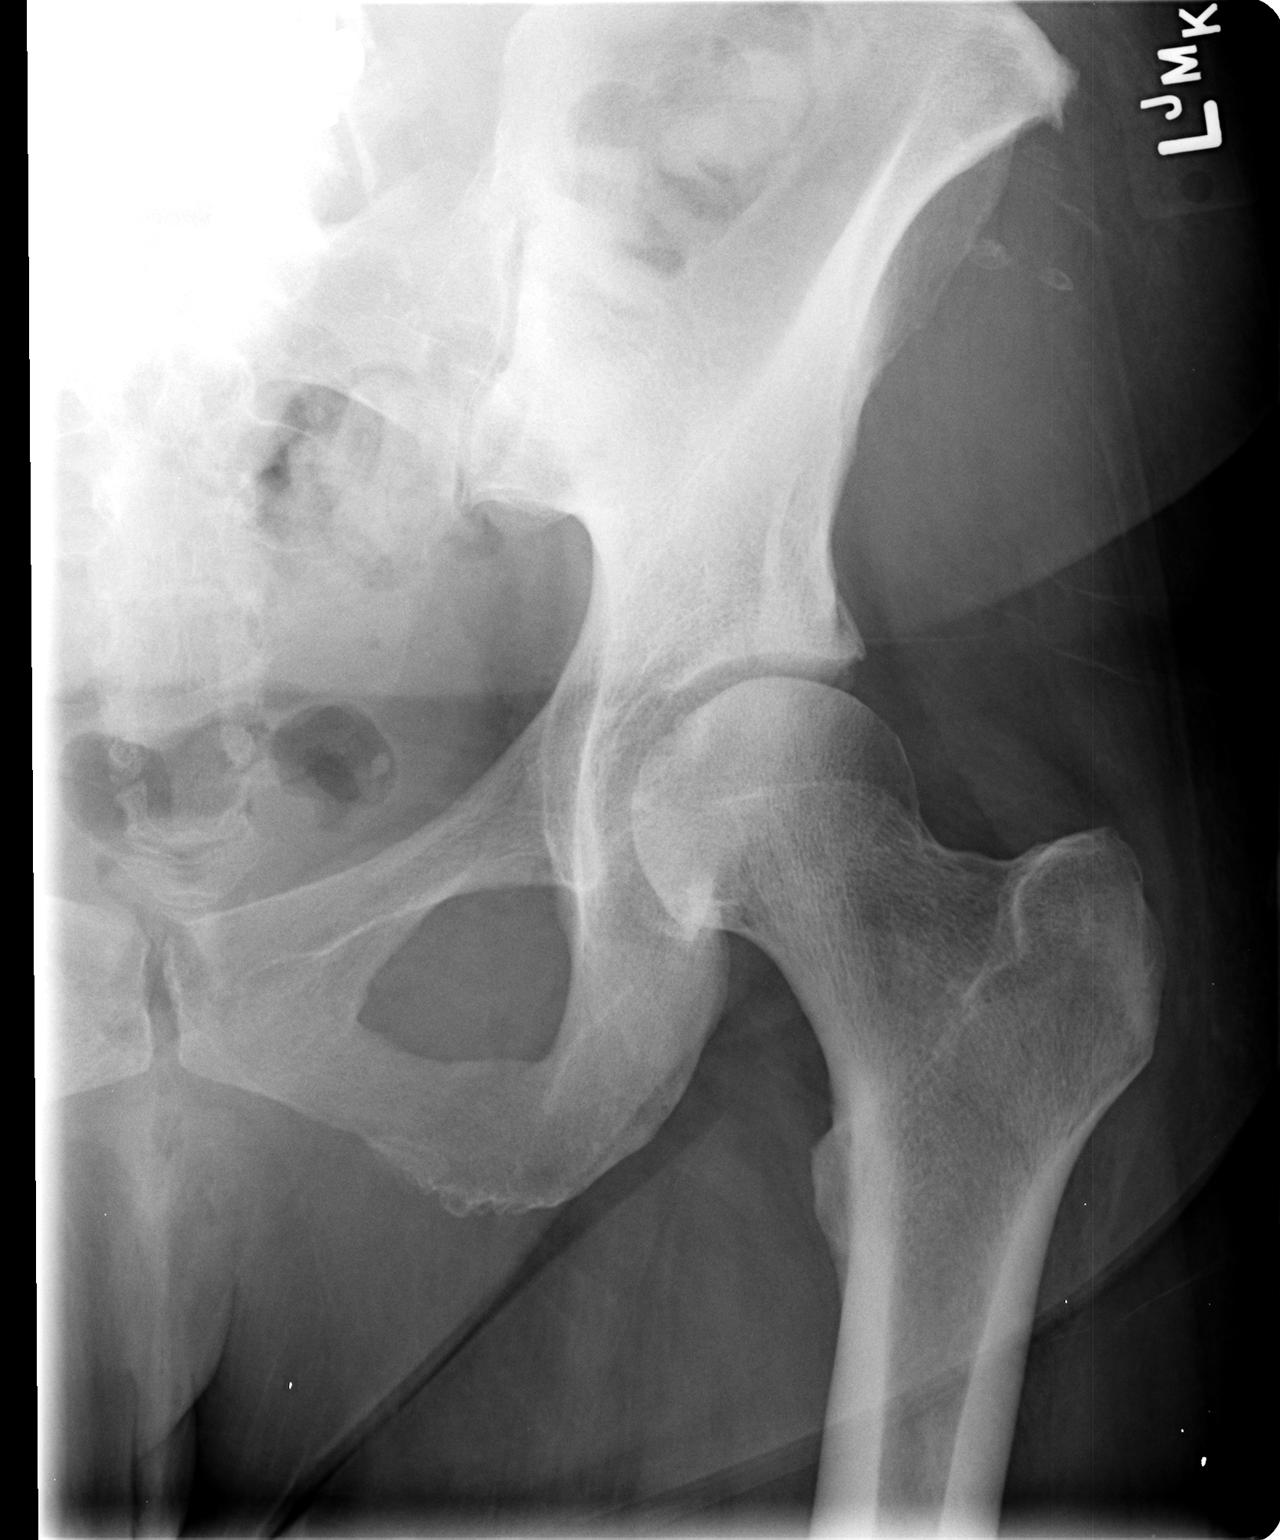

[view not recorded (3 of 3)]
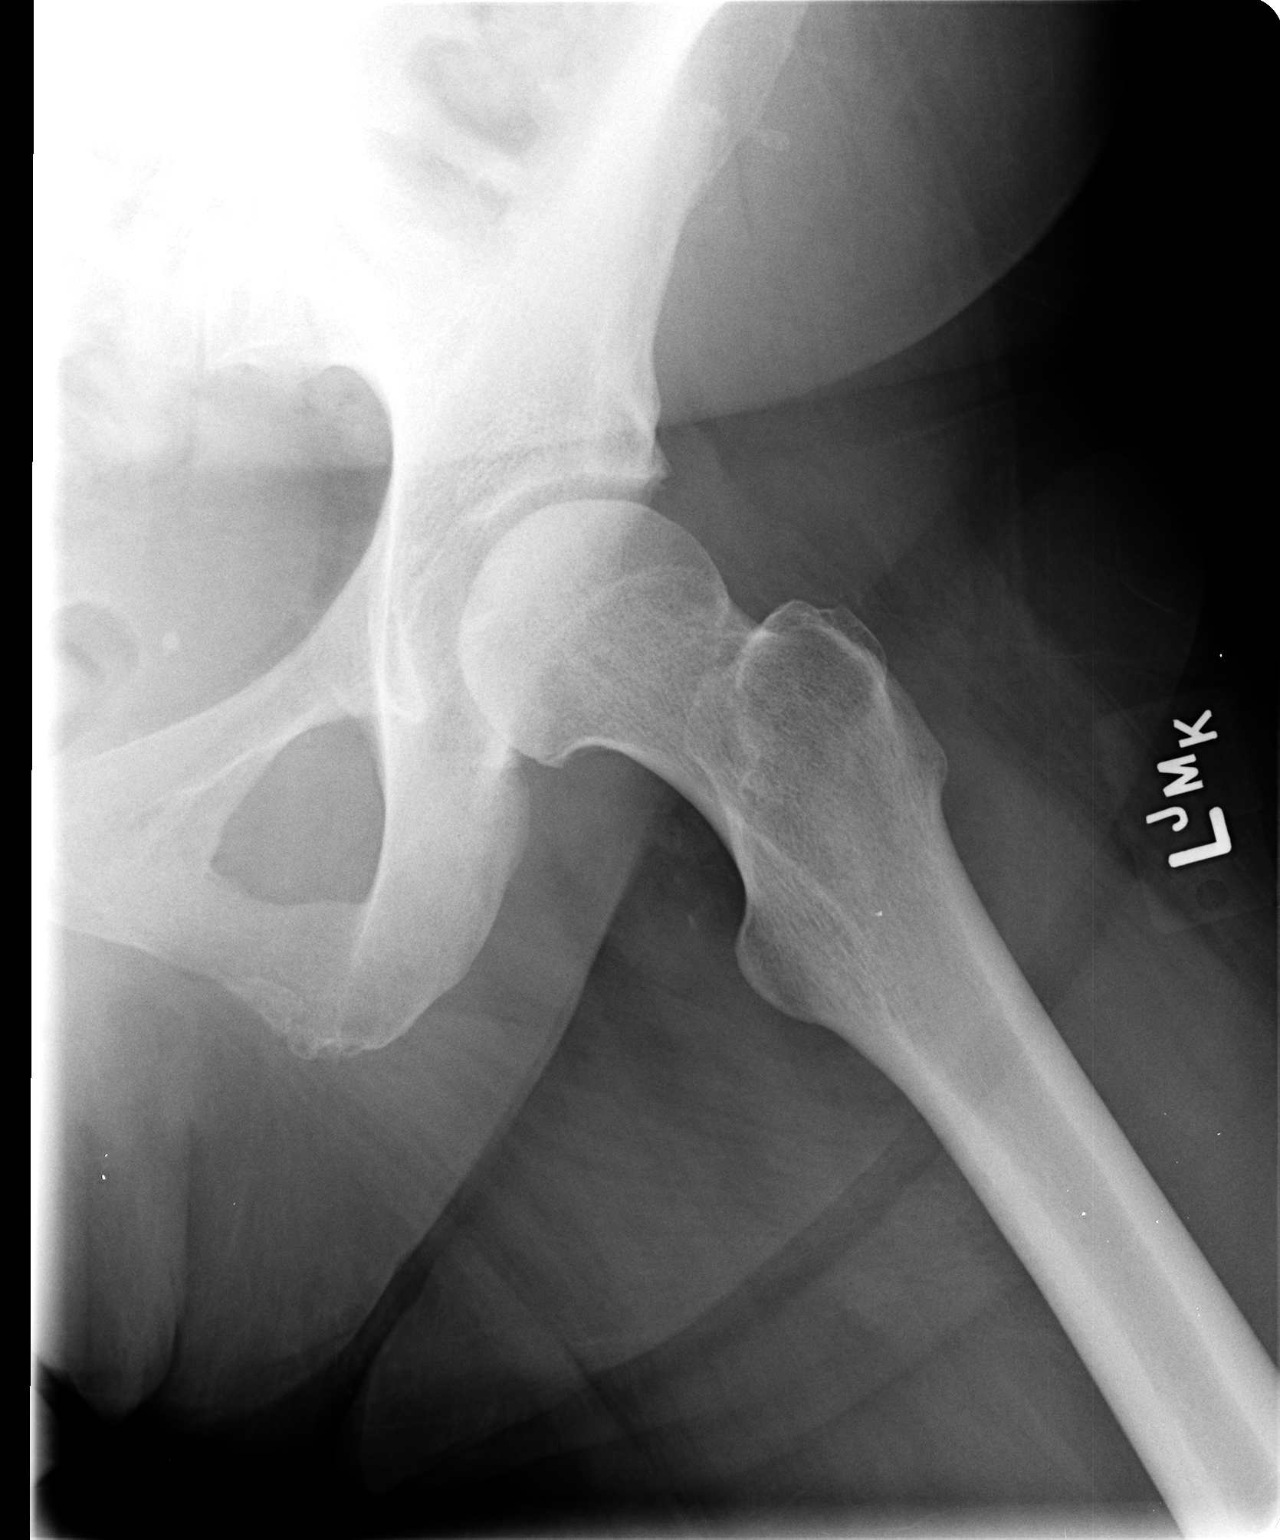

[3 of 3 positions shown; findings below may reference images not displayed]

FINDINGS: The hip joints are fairly well maintained.  Minimal
degenerative changes.  No acute bony findings.  No plain film
evidence of avascular necrosis.  The pubic symphysis and SI joints
are intact.  Lumbar fusion hardware is noted.
IMPRESSION: No acute bony findings.

## 2010-03-21 ENCOUNTER — Encounter: Payer: Self-pay | Admitting: Cardiology

## 2010-03-21 IMAGING — US US EXTREM LOW VENOUS*L*
1 series · 13 of 24 positions shown · non-contrast
Comparison: 02/21/2009

Addendum Begins

Please note to the initial report stated this was a new right lower
extremity venous ultrasound.  This should have stated the LEFT
lower extremity.  History was LEFT lower extremity swelling.
Mentioned of right in these findings and impression should have
read LEFT.
Addendum Ends
CLINICAL DATA: Right lower extremity swelling.
RIGHT LOWER EXTREMITY VENOUS DUPLEX ULTRASOUND
TECHNIQUE: Gray-scale sonography with graded compression, as well
as color Doppler and duplex ultrasound, were performed to evaluate
the deep venous system of the right lower extremity from the level
of the common femoral vein through the popliteal and proximal calf
veins. Spectral Doppler was utilized to evaluate flow at rest and
with distal augmentation maneuvers.

[Series 1: unknown · 13 of 31 slices shown]
[im 1/31]
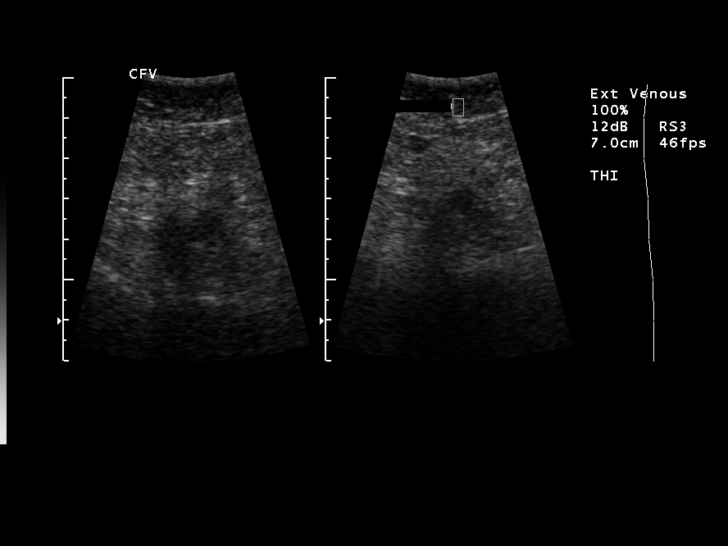
[im 3/31]
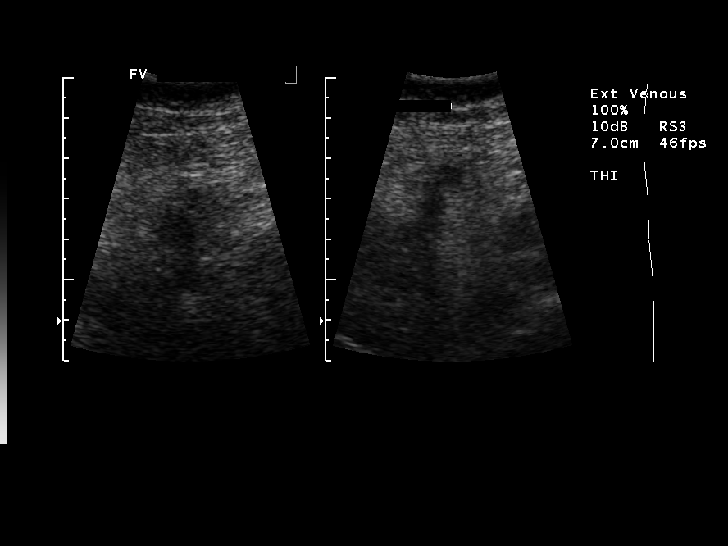
[im 6/31]
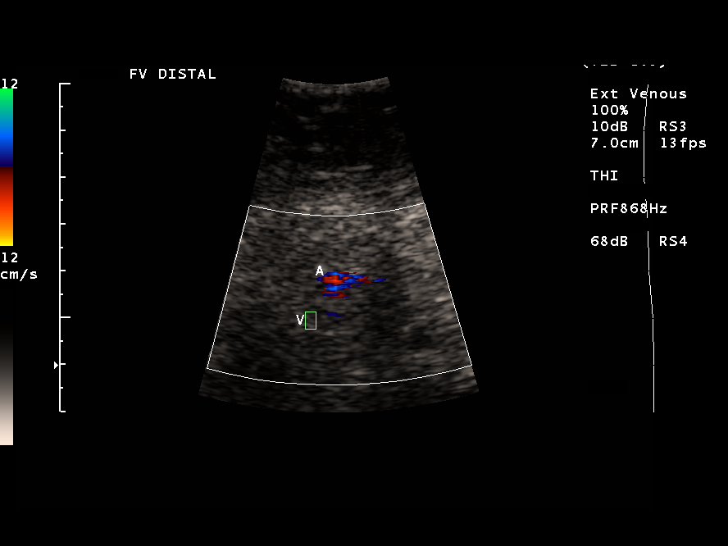
[im 8/31]
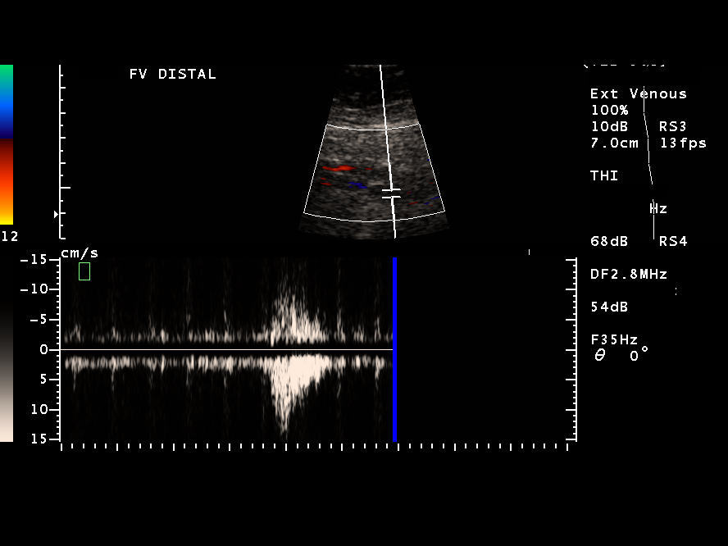
[im 11/31]
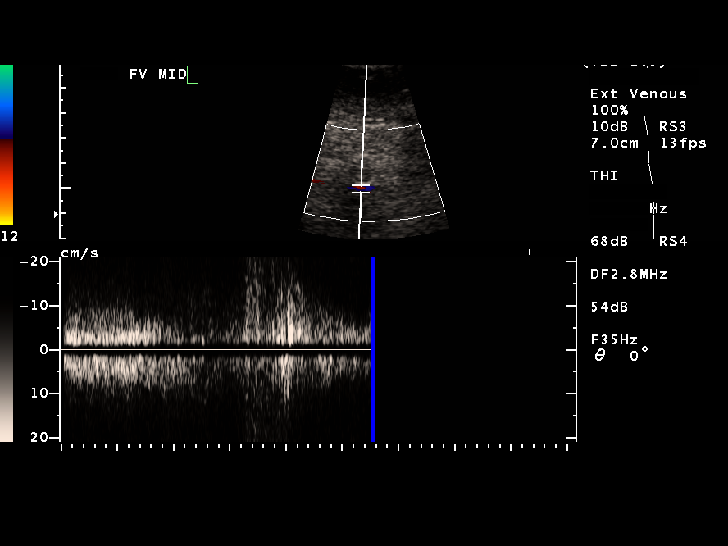
[im 14/31]
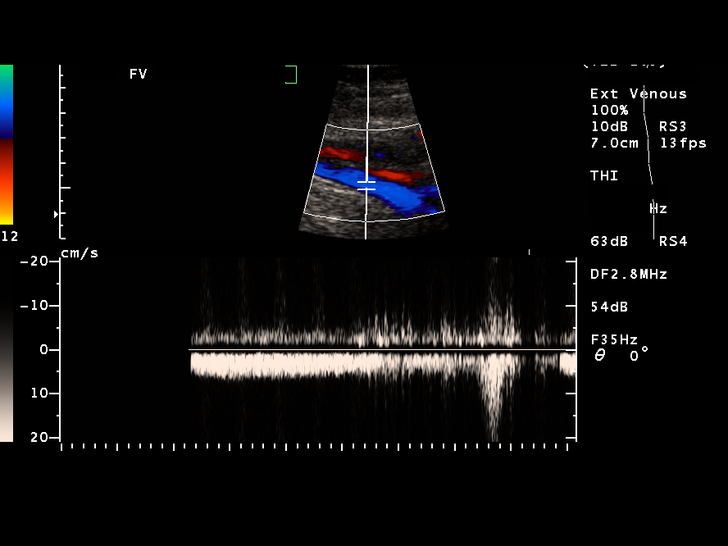
[im 16/31]
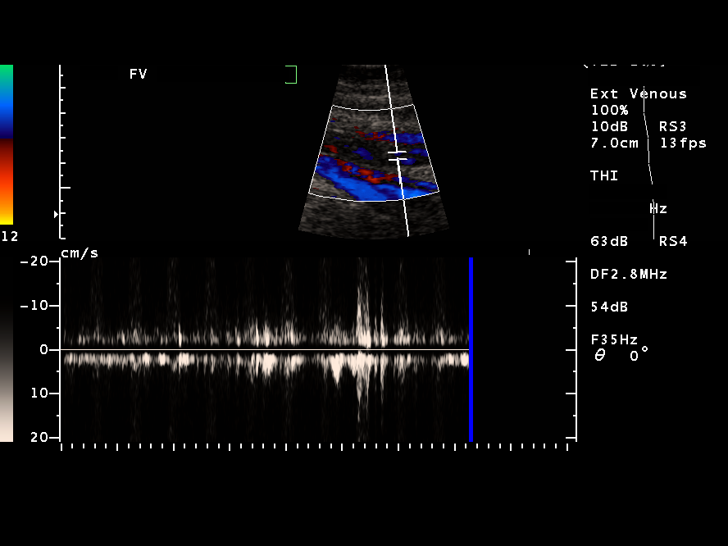
[im 17/31]
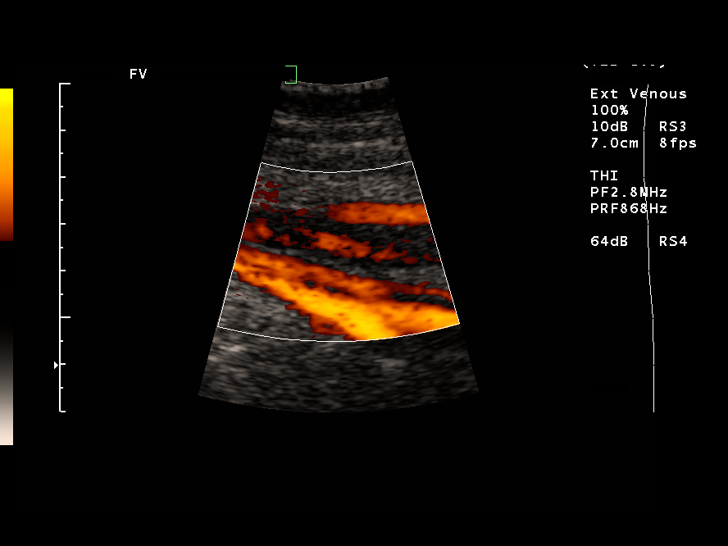
[im 20/31]
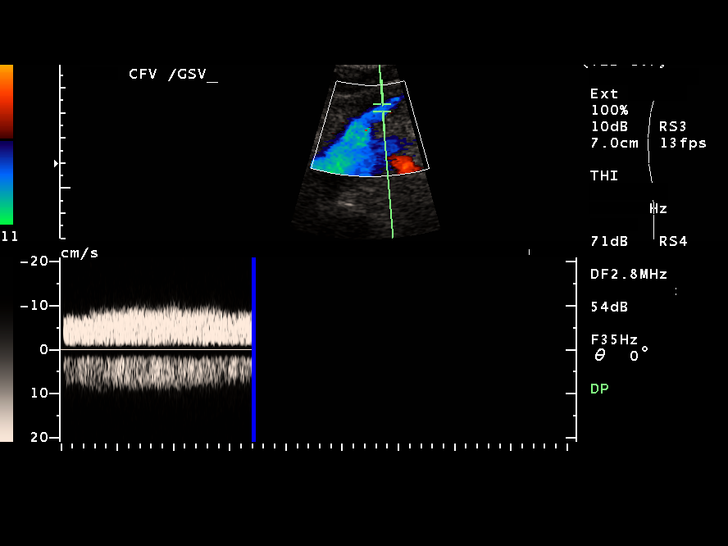
[im 23/31]
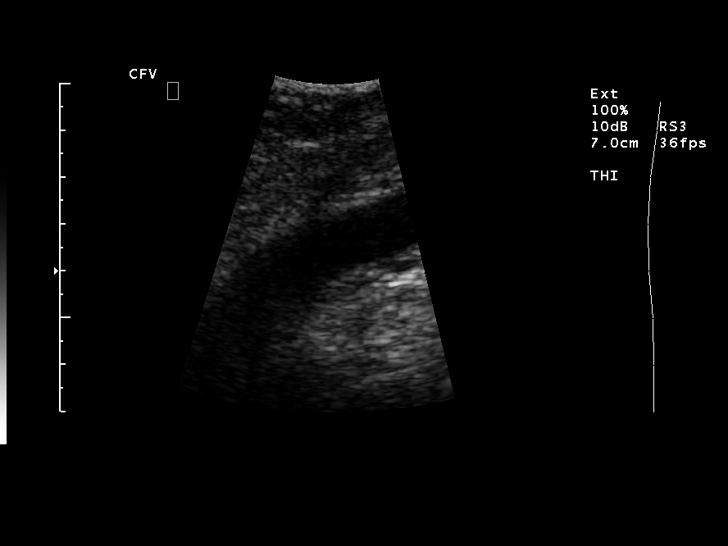
[im 25/31]
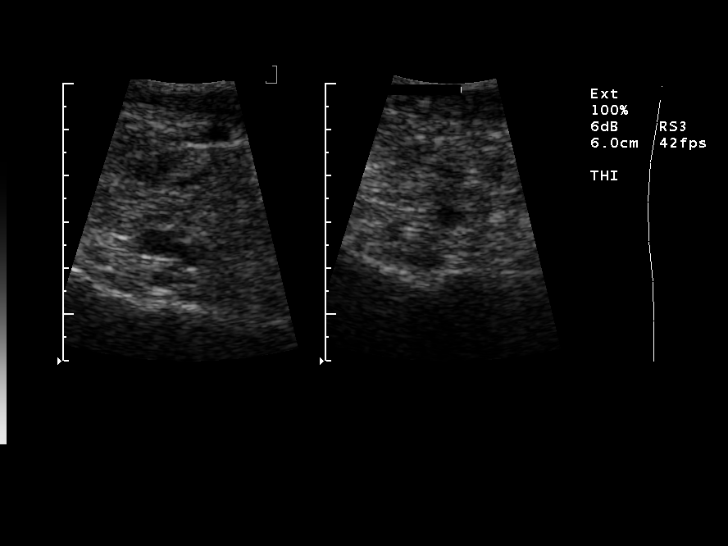
[im 28/31]
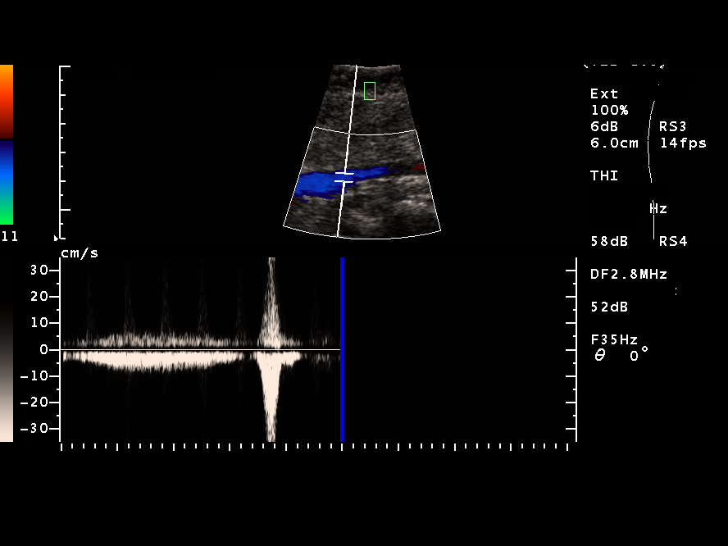
[im 31/31]
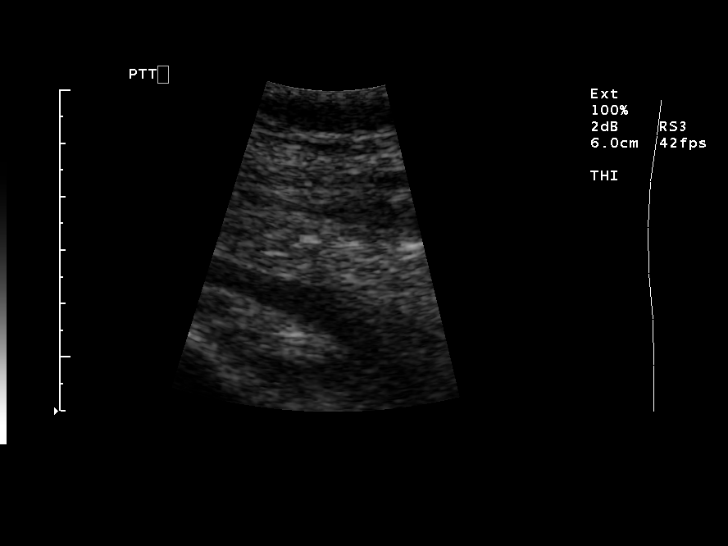

[13 of 24 positions shown; findings below may reference images not displayed]

FINDINGS: The wave forms within the right lower extremity deep
venous structures have changed since the prior study, now appearing
monophasic.  This could suggest central thrombosis, possibly within
the iliac veins.  Study somewhat limited due to the right lower
extremity swelling.  Vessels are difficult to compress.  There
appears to be partial thrombosis within the proximal aspect of the
superficial femoral vein in the proximal thigh.
IMPRESSION: Suspect partial thrombosis in the proximal femoral vein.
Monophasic wave forms noted throughout the right lower extremity
deep venous structures suggest a more central obstruction.  Cannot
exclude iliac venous thrombosis.

## 2010-03-26 IMAGING — CT CT ABDOMEN W/O CM
2 of 4 series · 15 of 46 positions shown, 17 images · non-contrast
Comparison: None available.

CT ABDOMEN

CLINICAL DATA: Extremity DVT.  Decreased hemoglobin and.  Renal
failure.

CT ABDOMEN AND PELVIS WITHOUT CONTRAST
TECHNIQUE: Multidetector CT imaging of the abdomen and pelvis was
performed following the standard protocol without intravenous
contrast.

[Series 2: abd|pel w/o 5.0 b40f · axial · non-contrast · 0.79mm/px · z∈[-484,-34]mm · 12 of 104 slices shown, 14 images]
[im 7/104  soft-tissue]
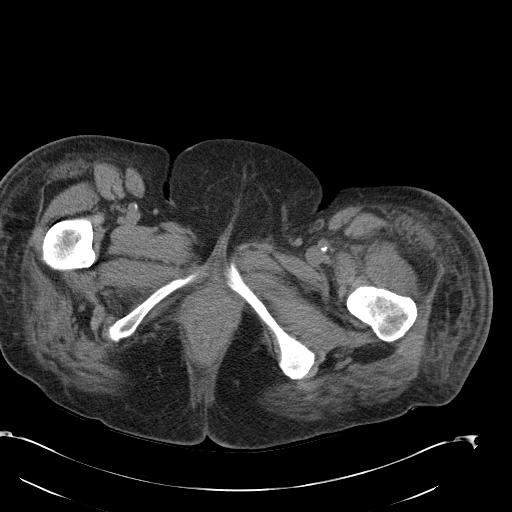
[im 7/104  bone]
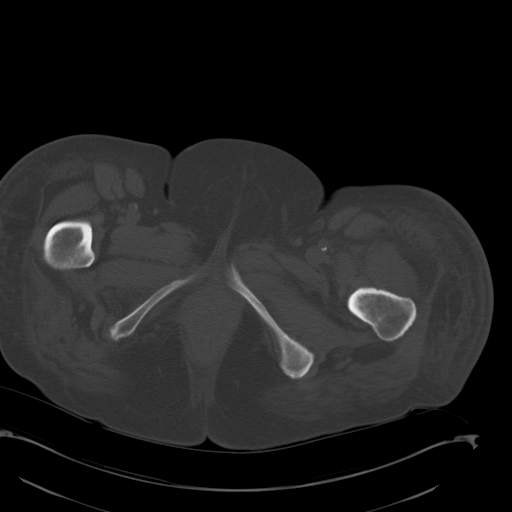
[im 14/104  soft-tissue]
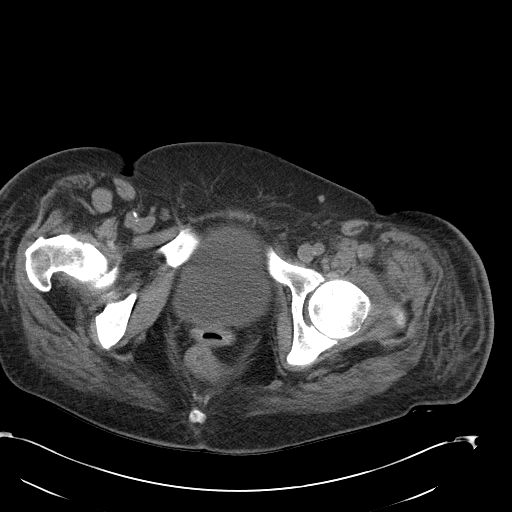
[im 21/104  soft-tissue]
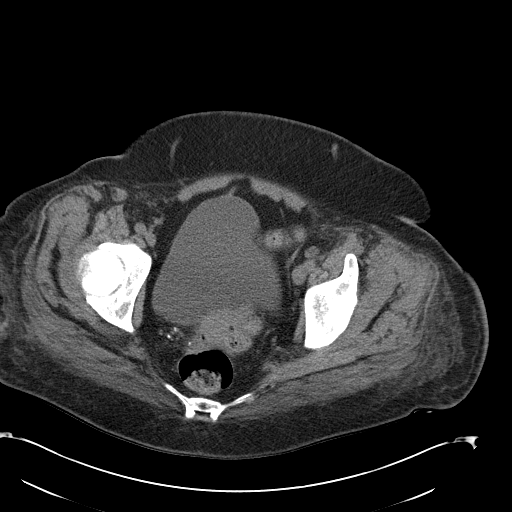
[im 35/104  soft-tissue]
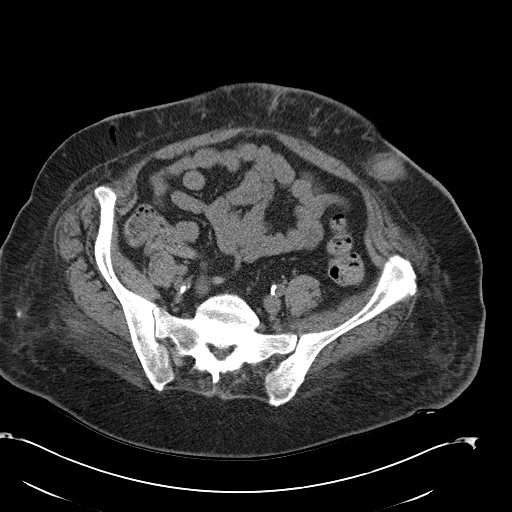
[im 42/104  soft-tissue]
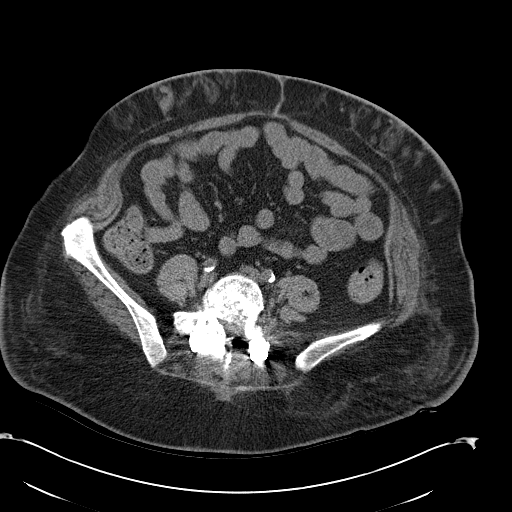
[im 49/104  soft-tissue]
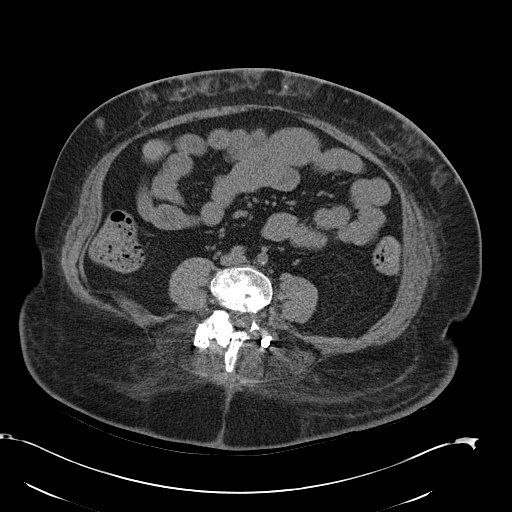
[im 55/104  soft-tissue]
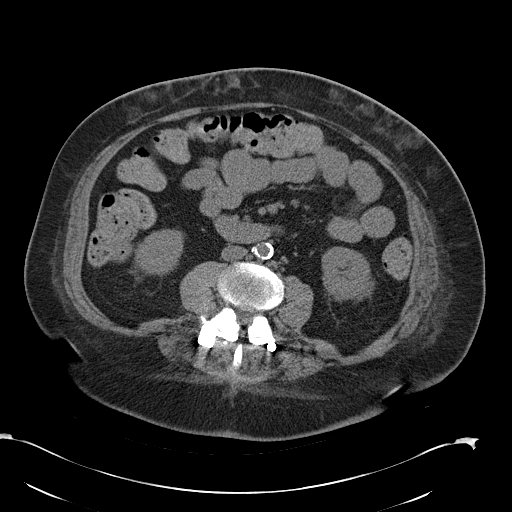
[im 62/104  soft-tissue]
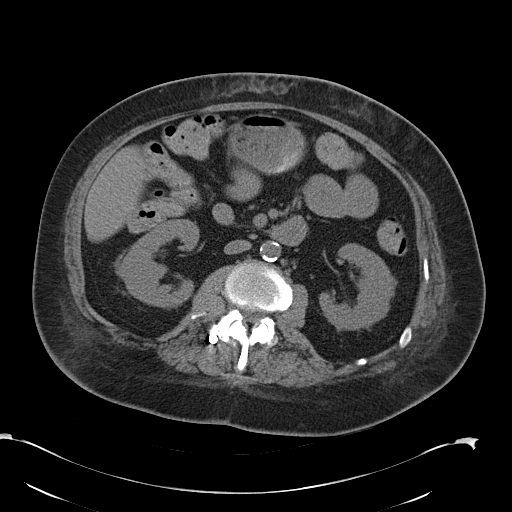
[im 69/104  soft-tissue]
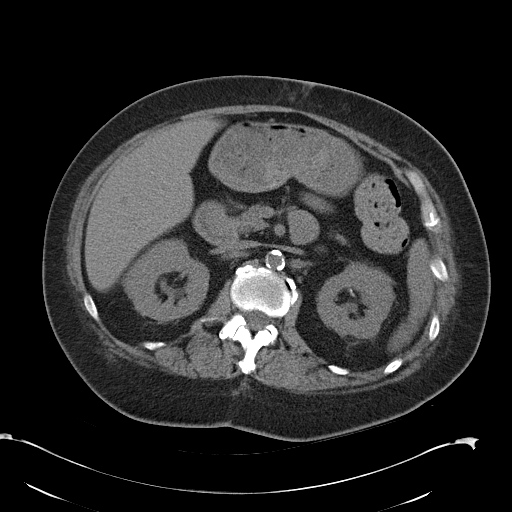
[im 69/104  bone]
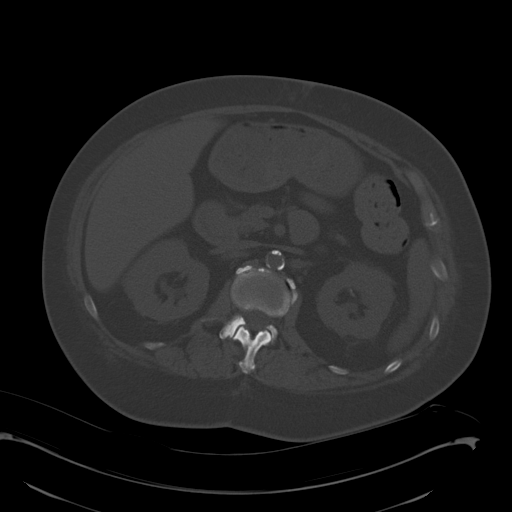
[im 83/104  soft-tissue]
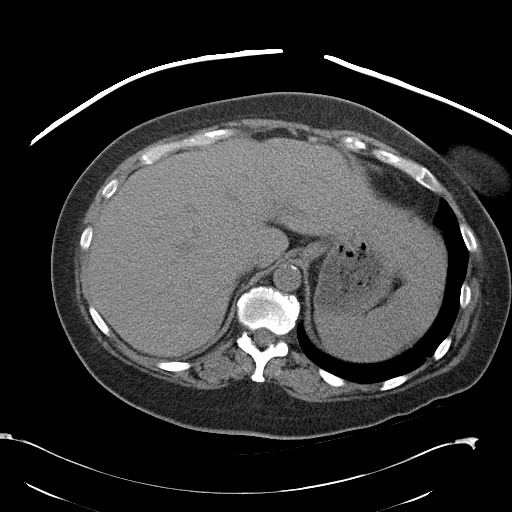
[im 90/104  soft-tissue]
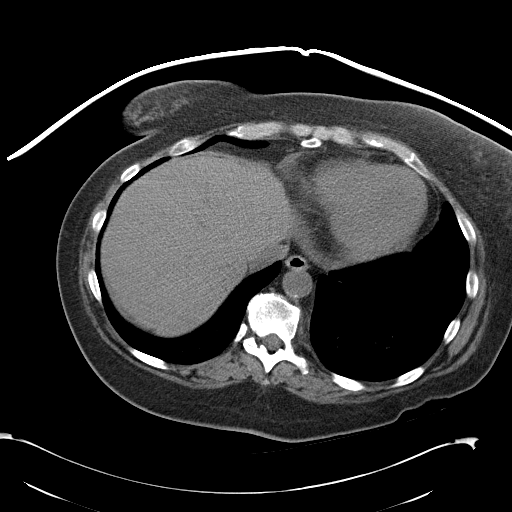
[im 97/104  soft-tissue]
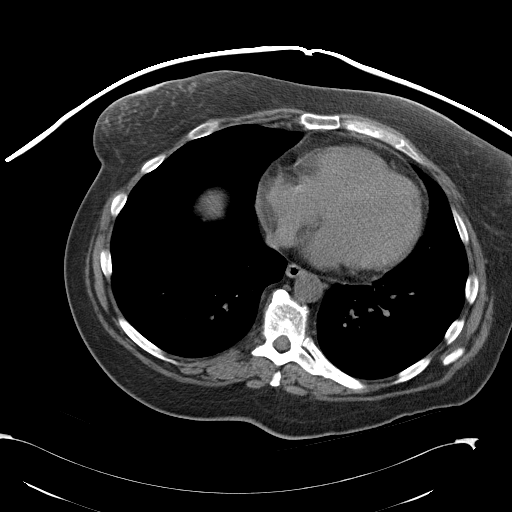

[Series 4: mpr cor (id) · coronal · 0.73mm/px · 3 of 85 slices shown]
[im 29/85  soft-tissue]
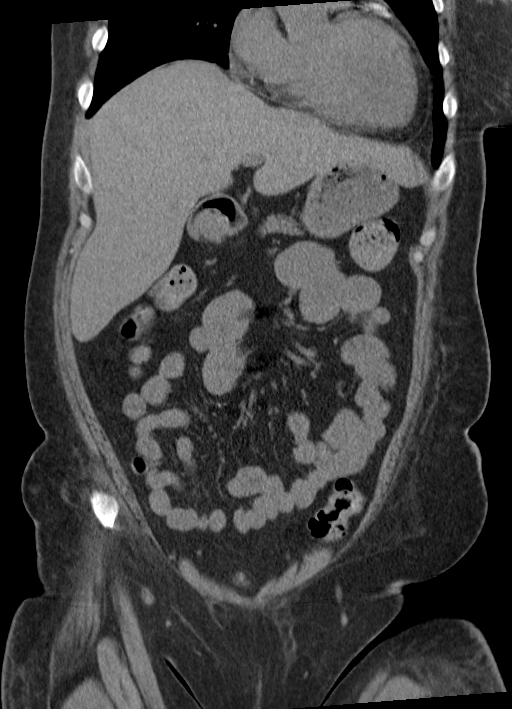
[im 38/85  soft-tissue]
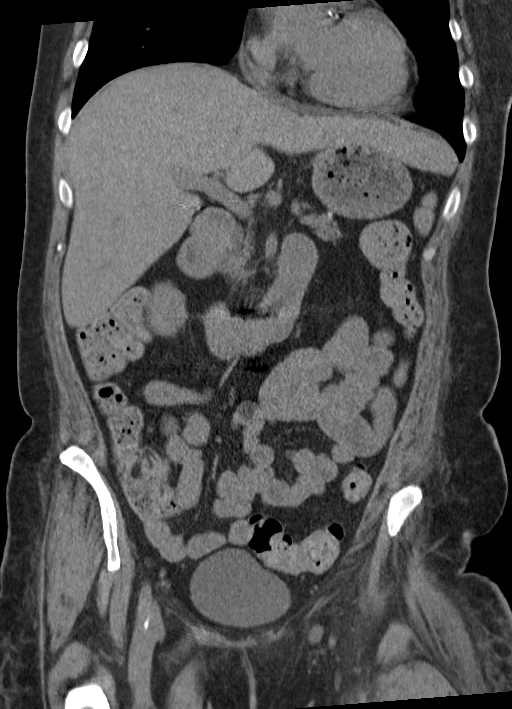
[im 47/85  soft-tissue]
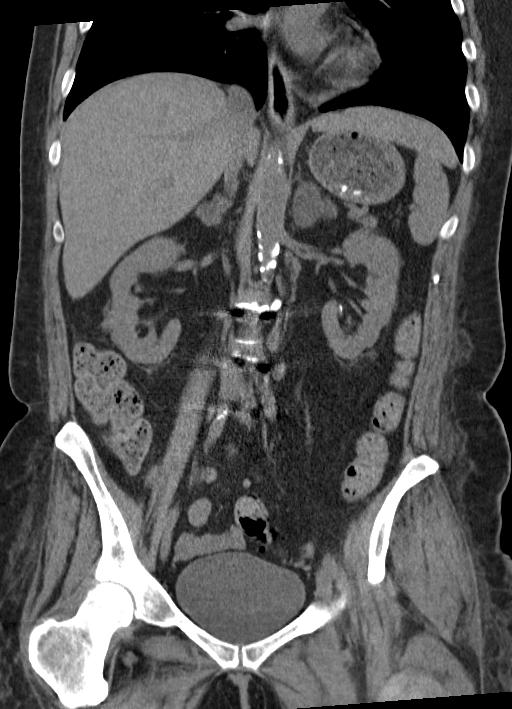

[15 of 46 positions shown; findings below may reference images not displayed]

FINDINGS: There is no pleural or pericardial effusion.  The lung
bases are clear.

The liver is unremarkable.  The patient is status post
cholecystectomy.  There is a large low attenuating lesion in the
left adrenal gland with Hounsfield measurements of negative
which measures 3.5 cm transverse by 2.5 cm AP compatible with a
large adenoma or cyst.  Small adenoma in the right adrenal gland
noted.  The kidneys, spleen and pancreas are all unremarkable.
Scattered areas of infiltration of the subcutaneous fat anterior
abdominal wall are identified.  These areas include a focal
hematoma on the left measuring 2.1 x 2.2 by 4.3 cm.  No focal bony
abnormality.  The patient is status post lumbar fusion.
IMPRESSION: 1.  Scattered subcutaneous hematomas in the anterior abdominal
wall.
2.  No acute intra-abdominal abnormality.
3.  Bilateral adrenal adenomas.  Large lesion on the left may
represent a renal cyst.
4.  Negative for evidence of malignancy in the abdomen.

CT PELVIS
FINDINGS: There is partial visualization of high attenuation
collection in the anterior compartment of the left thigh consistent
with hematoma.  While only partially visualized, hematoma is likely
large.  No intrapelvic hematoma is identified.  No lymphadenopathy
or fluid.  Urinary bladder appears normal.  The patient status post
hysterectomy.  The colon unremarkable.  No focal bony abnormality.
IMPRESSION: Partial visualization of what is likely a very large hematoma in
the anterior compartment of the left thigh.  Findings were called
to the patient's nurse, Dc, at the time interpretation.  There
is no evidence of malignancy on this examination.

## 2010-04-11 IMAGING — CR DG CHEST 2V
2 series · 2 of 2 positions shown · non-contrast
Comparison: 03/05/2009

CLINICAL DATA: Chest pain.

CHEST - 2 VIEW

[view not recorded (1 of 2)]
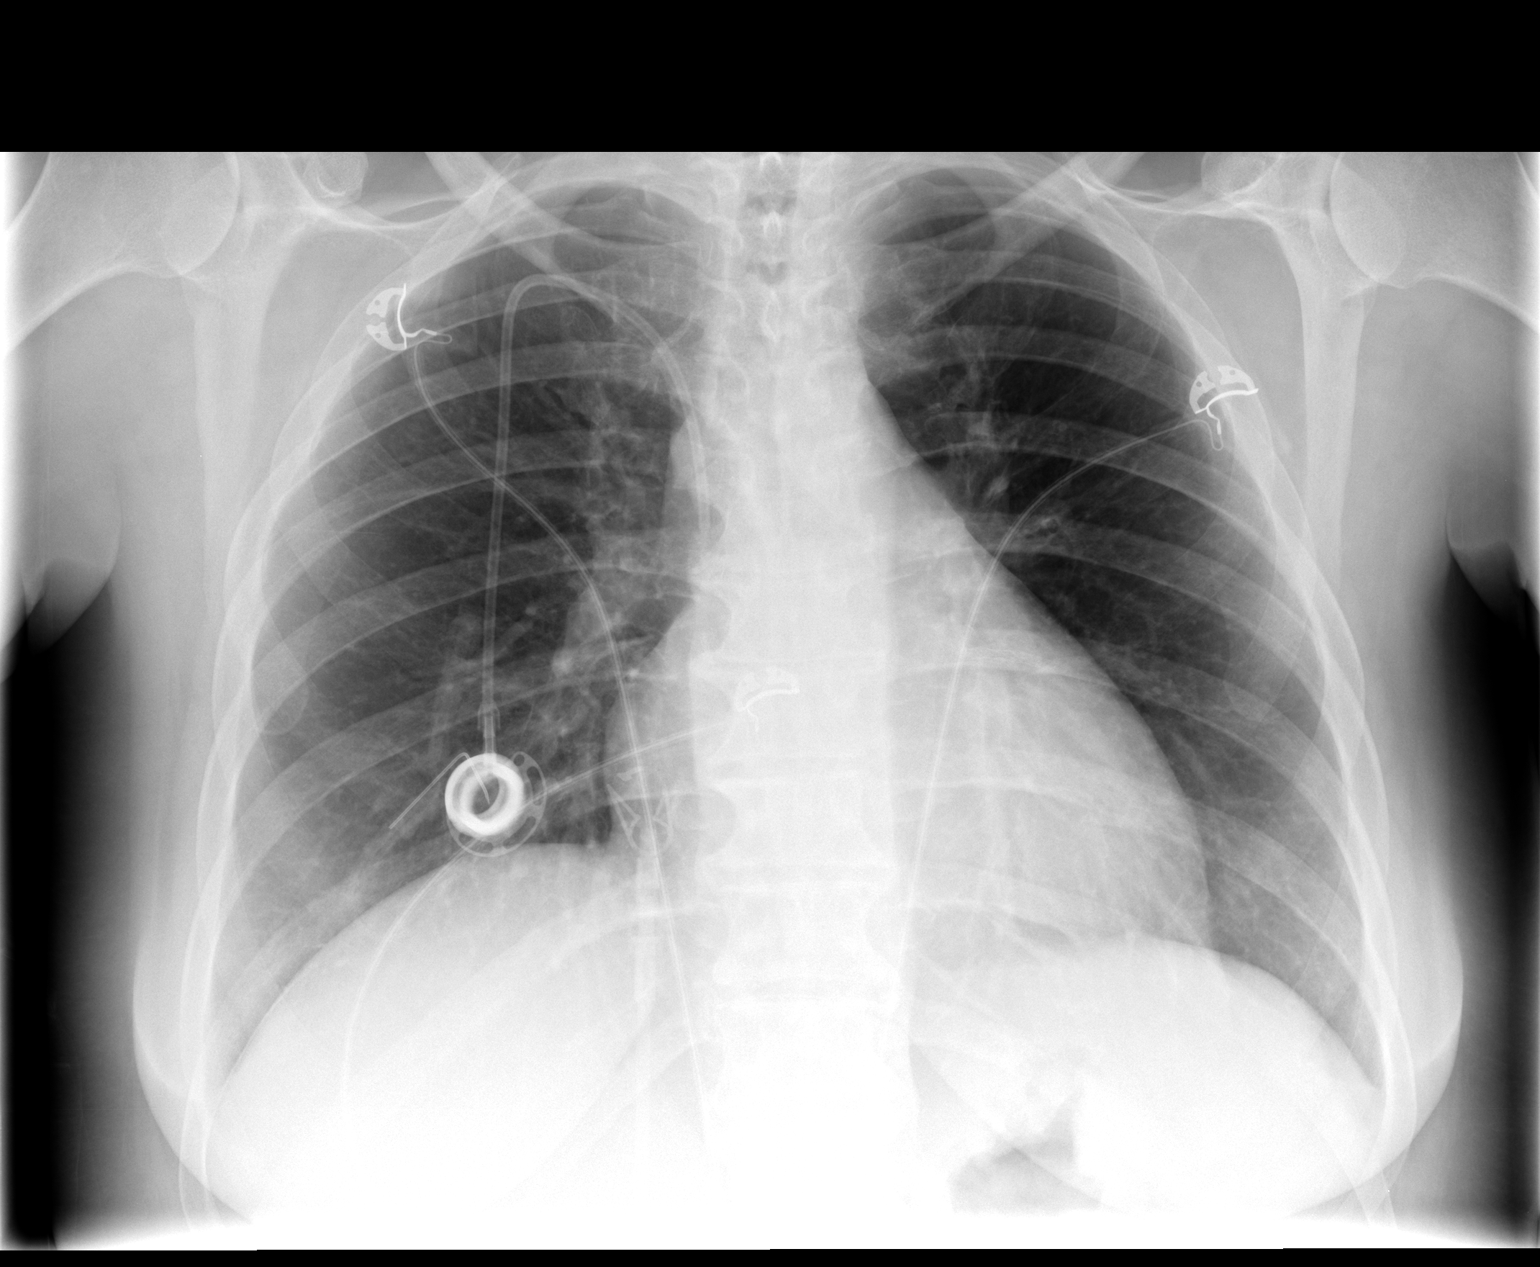

[view not recorded (2 of 2)]
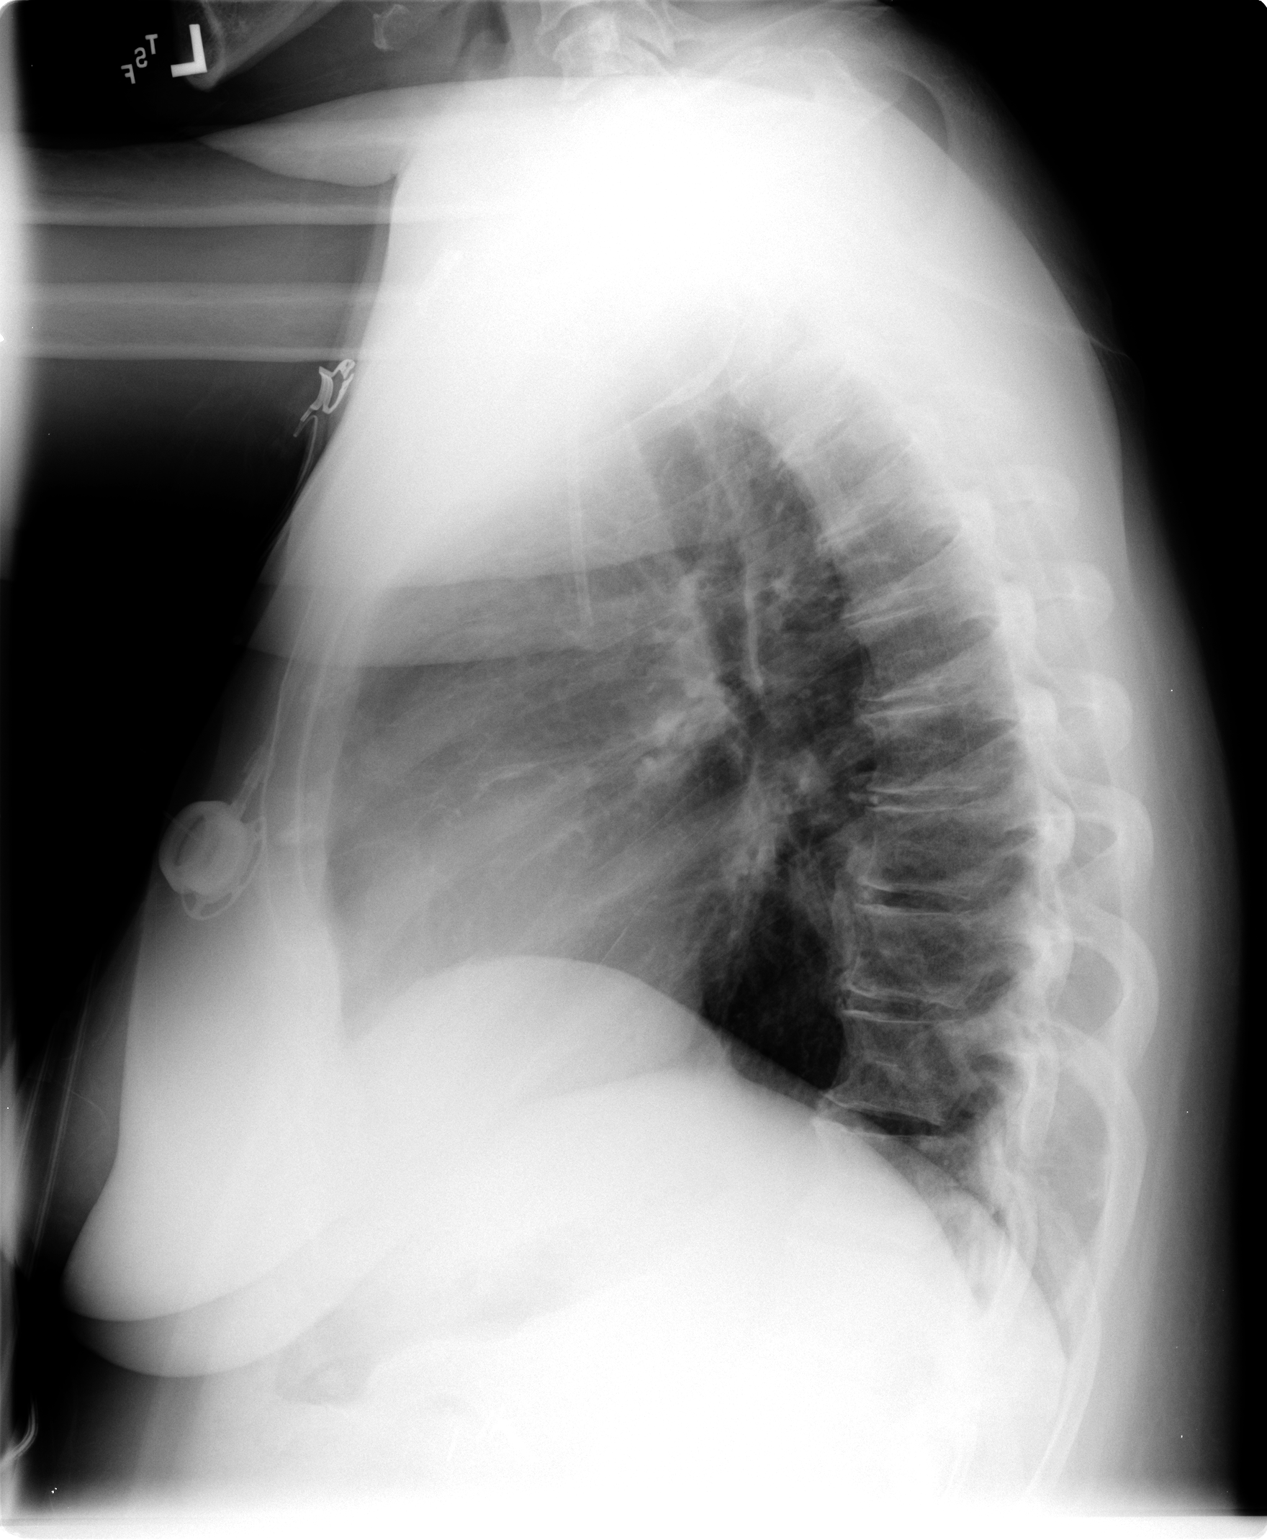

[2 of 2 positions shown; findings below may reference images not displayed]

FINDINGS: The Port-A-Cath is stable.  The cardiac silhouette,
mediastinal and hilar contours are within normal limits.  The lungs
are clear.  The bony thorax is intact.
IMPRESSION: No acute cardiopulmonary findings.

## 2010-04-20 ENCOUNTER — Ambulatory Visit: Payer: Self-pay | Admitting: Vascular Surgery

## 2010-05-17 ENCOUNTER — Ambulatory Visit (HOSPITAL_COMMUNITY): Payer: Self-pay | Admitting: Oncology

## 2010-05-17 ENCOUNTER — Ambulatory Visit (HOSPITAL_COMMUNITY): Payer: Self-pay | Admitting: Nephrology

## 2010-05-17 ENCOUNTER — Encounter (HOSPITAL_COMMUNITY): Admission: RE | Admit: 2010-05-17 | Discharge: 2010-05-26 | Payer: Self-pay | Admitting: Oncology

## 2010-05-24 IMAGING — US US BREAST*R*
1 series · 4 of 4 positions shown · non-contrast
Comparison: Prior studies

CLINICAL DATA: 6-month reevaluation of probably benign right
breast nodule.

DIGITAL DIAGNOSTIC  RIGHT BREAST  MAMMOGRAM  WITH CAD AND RIGHT
BREAST ULTRASOUND:

[Series 1: us breast*right* · 0.07mm/px · 4 of 4 slices shown]
[im 1/4]
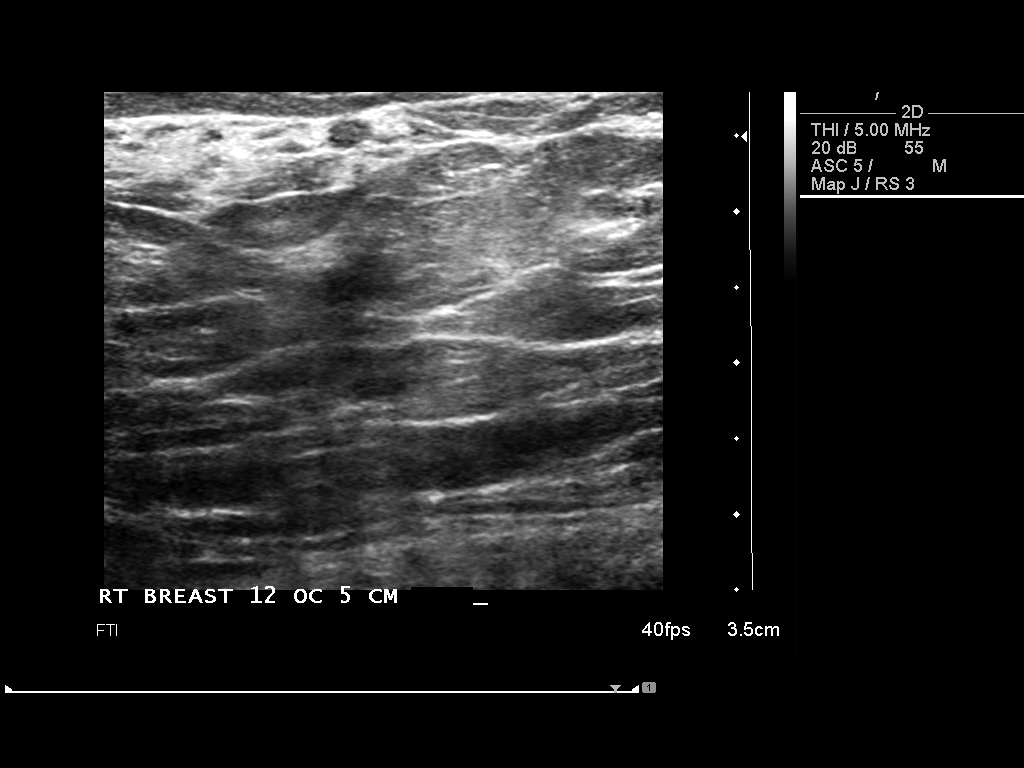
[im 2/4]
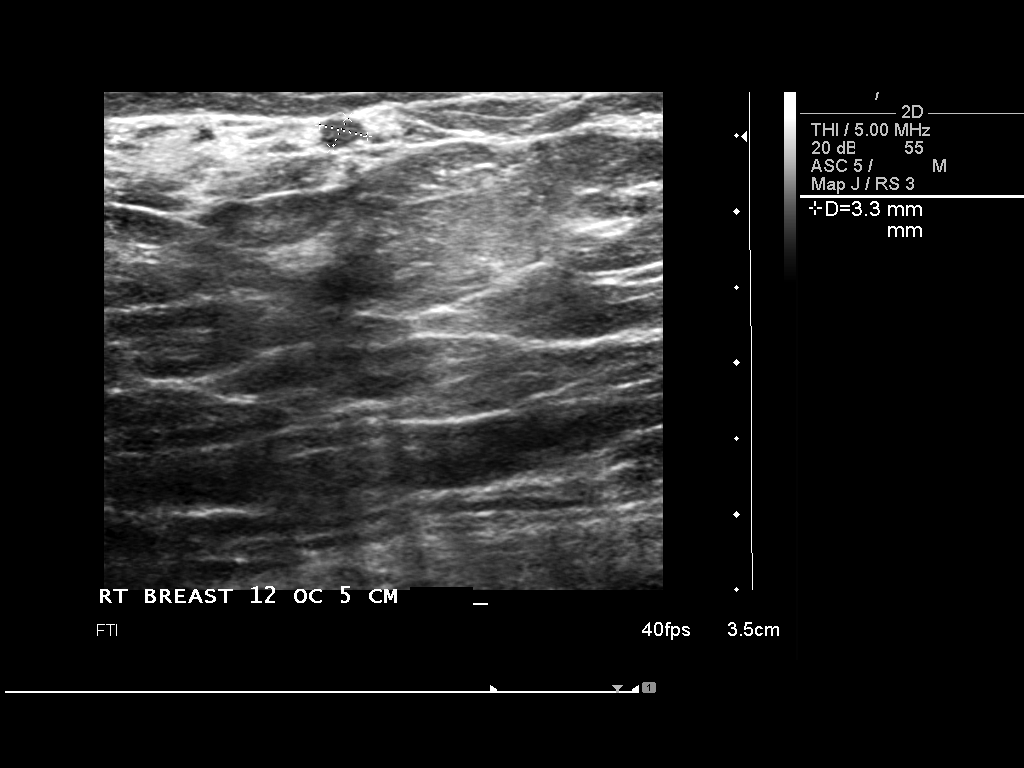
[im 3/4]
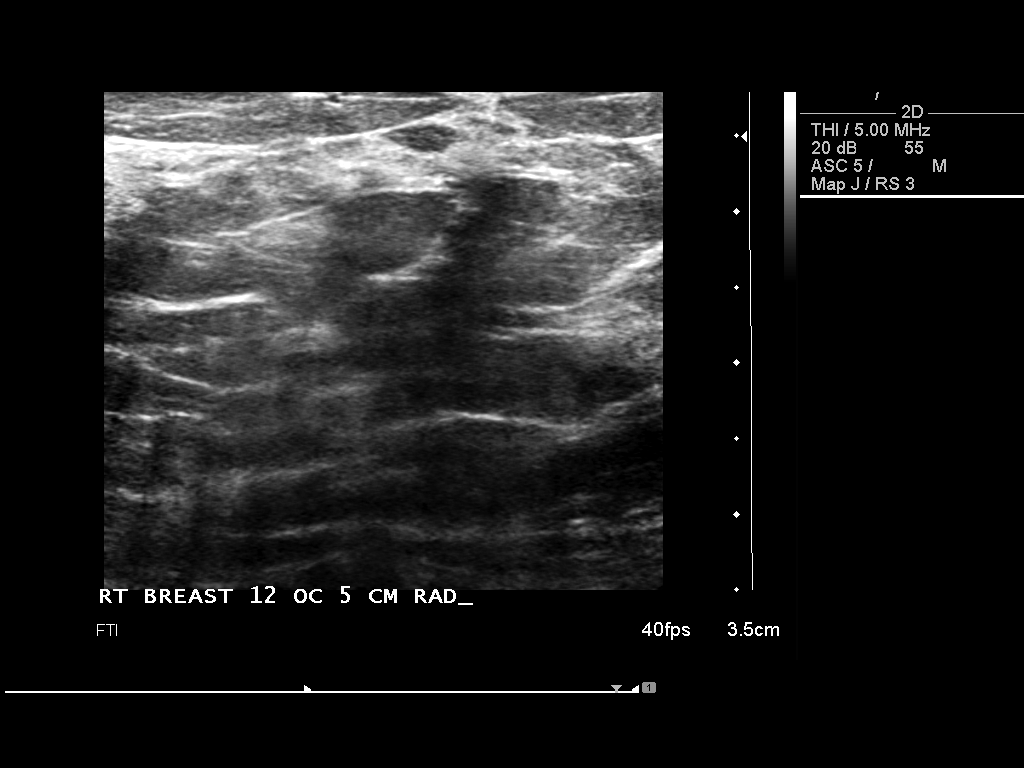
[im 4/4]
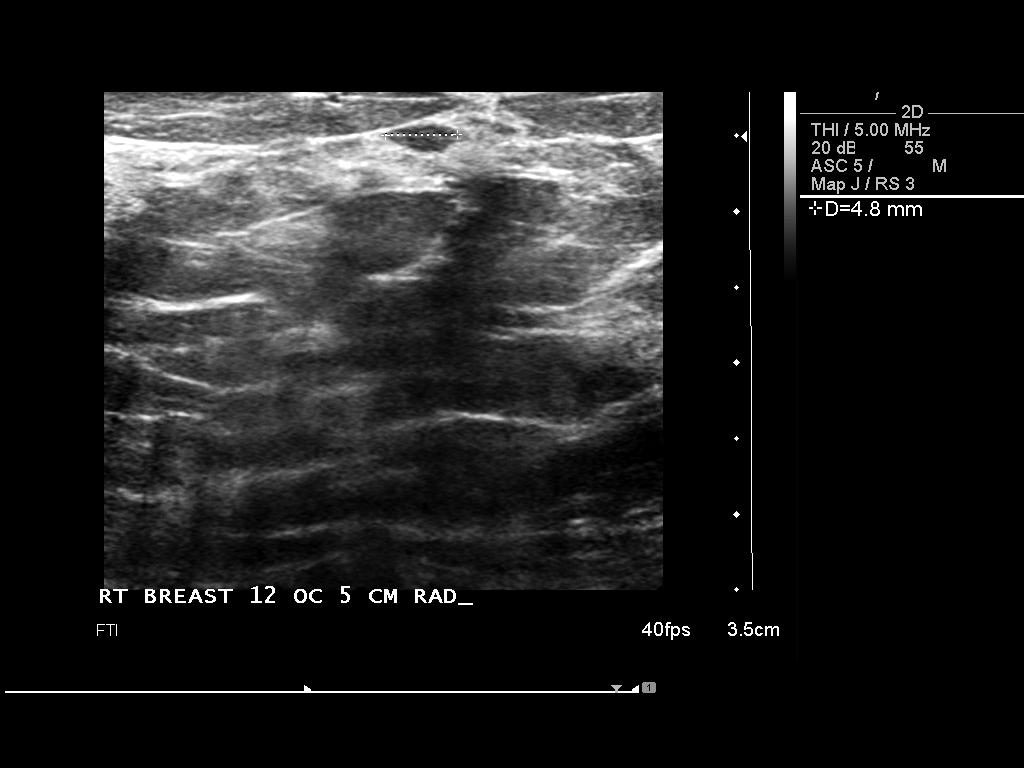

[4 of 4 positions shown; findings below may reference images not displayed]

FINDINGS: There is a stable scattered fibroglandular parenchymal
pattern present.  There is a Port-A-Cath located superiorly within
the right breast.  There is no worrisome mass, distortion, or
worrisome calcification present.
Mammographic images were processed with CAD.

On physical exam, there is no discrete palpable abnormality.

Ultrasound is performed, showing an oval, circumscribed,
horizontally oriented nodule located within the right breast at the
12 o'clock position 5 cm from the nipple.  On today's study, this
measures 2 x 3 x 5 mm in size and has slightly decreased in size
when compared to prior study.  Most likely this represents a small
fibroadenoma or mildly complicated cyst.
IMPRESSION: 1.  Stable right breast parenchymal pattern.  Also, stable small
probably benign right breast nodule located at the 12 o'clock
position 5 cm from the nipple as discussed above.  Recommend follow-
up bilateral diagnostic mammography and right breast ultrasound in
6 months.

BI-RADS CATEGORY 3:  Probably benign finding(s) - short interval
follow-up suggested.

## 2010-06-09 ENCOUNTER — Emergency Department (HOSPITAL_COMMUNITY): Admission: EM | Admit: 2010-06-09 | Discharge: 2010-06-09 | Payer: Self-pay | Admitting: Emergency Medicine

## 2010-06-11 ENCOUNTER — Emergency Department (HOSPITAL_COMMUNITY): Admission: EM | Admit: 2010-06-11 | Discharge: 2010-06-11 | Payer: Self-pay | Admitting: Emergency Medicine

## 2010-06-21 ENCOUNTER — Ambulatory Visit: Payer: Self-pay | Admitting: Internal Medicine

## 2010-07-02 IMAGING — CR DG CHEST 1V PORT
1 series · 1 of 1 positions shown · non-contrast
Comparison: 04/13/2009

CLINICAL DATA: Chest pain.  Nausea and vomiting.

PORTABLE CHEST - 1 VIEW

[view not recorded]
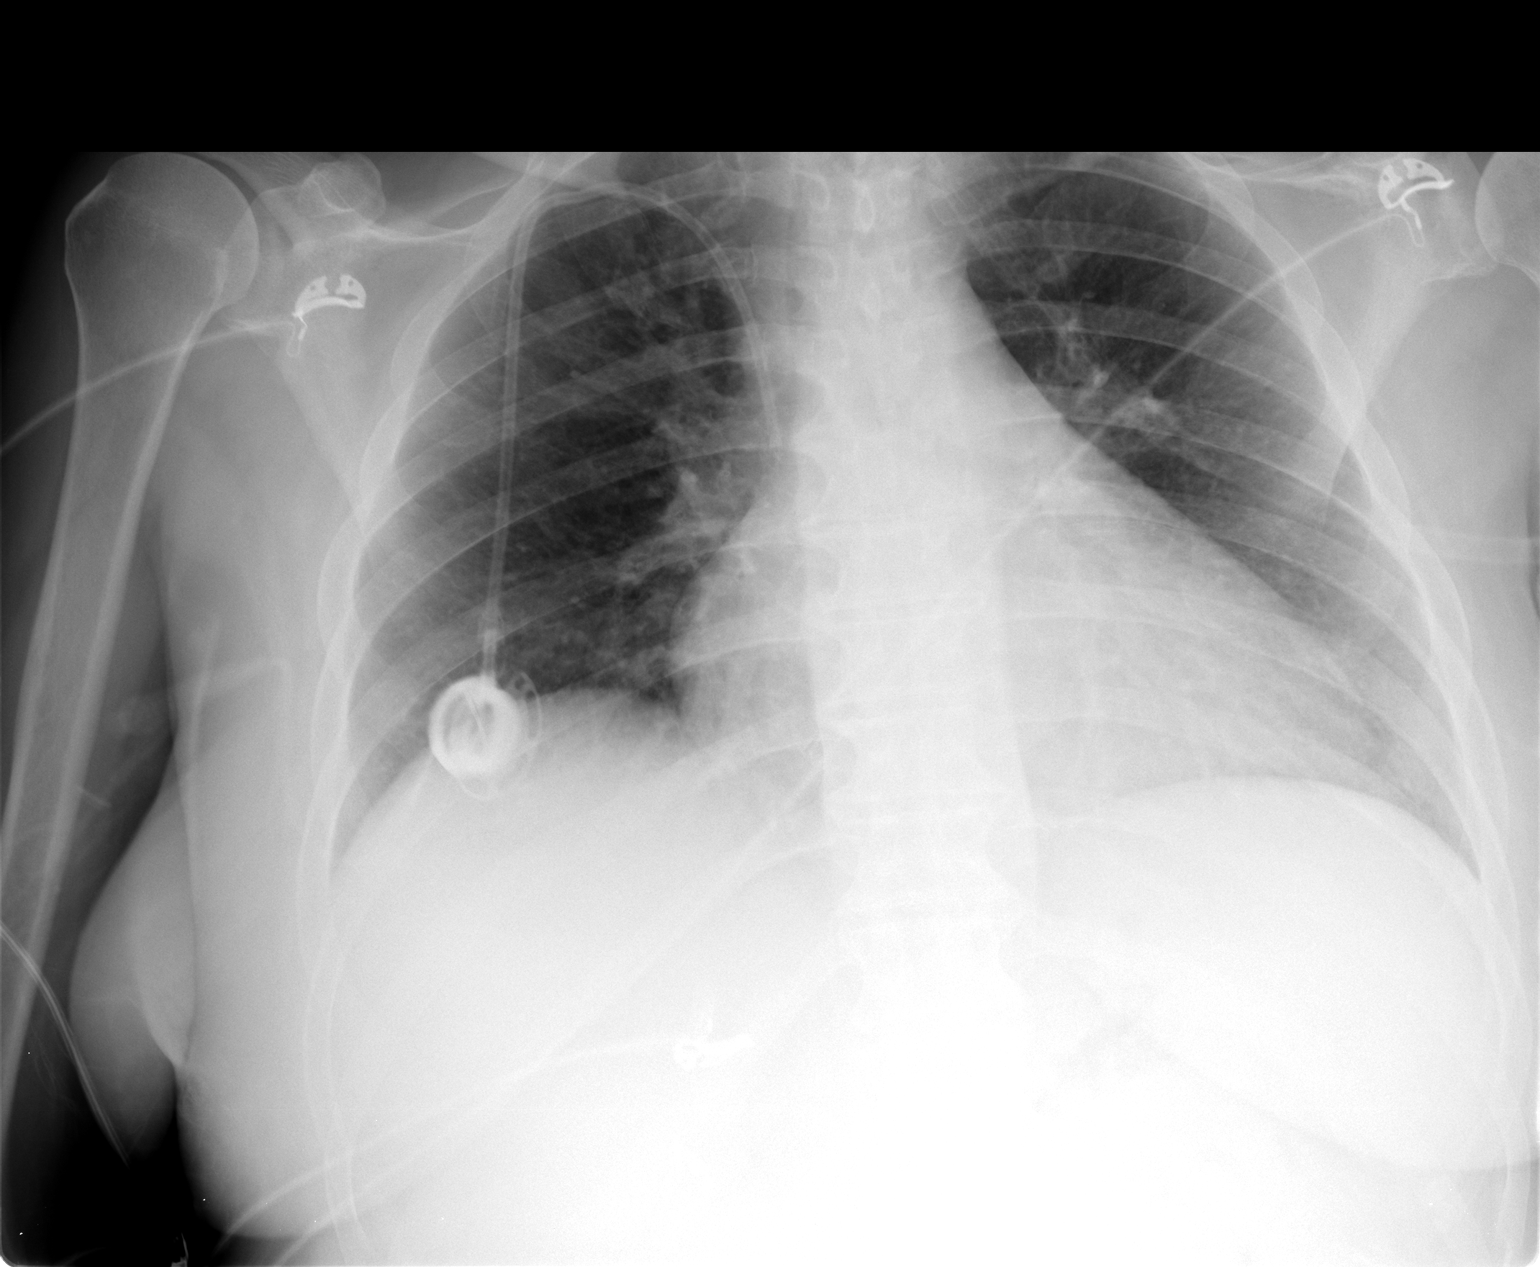

[1 of 1 positions shown; findings below may reference images not displayed]

FINDINGS: Heart size is within normal limits allowing for portable
technique. Both lungs are clear.  There is no evidence of pleural
effusion.  Right-sided Port-A-Cath remains in appropriate position.
IMPRESSION: Stable exam.  No acute findings.

## 2010-07-06 ENCOUNTER — Ambulatory Visit (HOSPITAL_COMMUNITY): Payer: Self-pay | Admitting: Oncology

## 2010-07-06 ENCOUNTER — Encounter (HOSPITAL_COMMUNITY)
Admission: RE | Admit: 2010-07-06 | Discharge: 2010-08-05 | Payer: Self-pay | Source: Home / Self Care | Attending: Oncology | Admitting: Oncology

## 2010-08-08 IMAGING — CR DG ABDOMEN 2V
3 series · 3 of 3 positions shown · non-contrast
Comparison: 02/17/2009

CLINICAL DATA: Abdominal pain.

ABDOMEN - 2 VIEW

[view not recorded (1 of 3)]
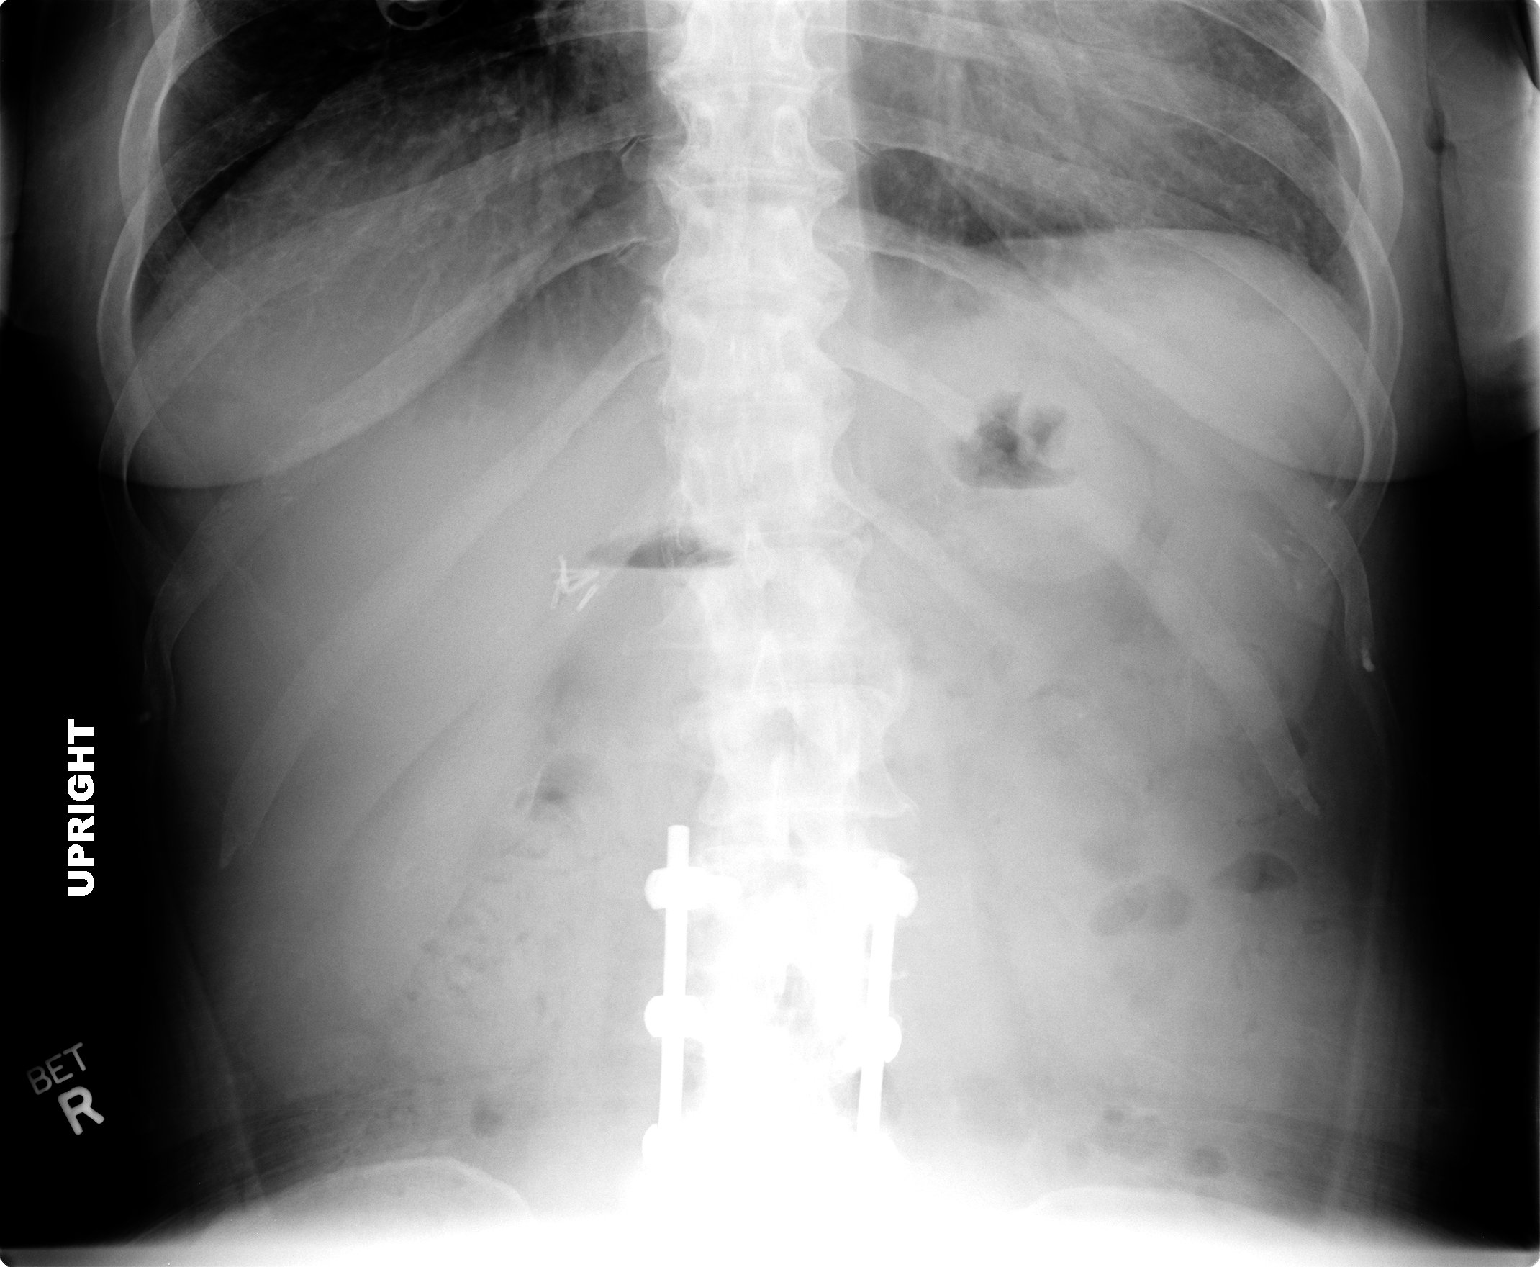

[view not recorded (2 of 3)]
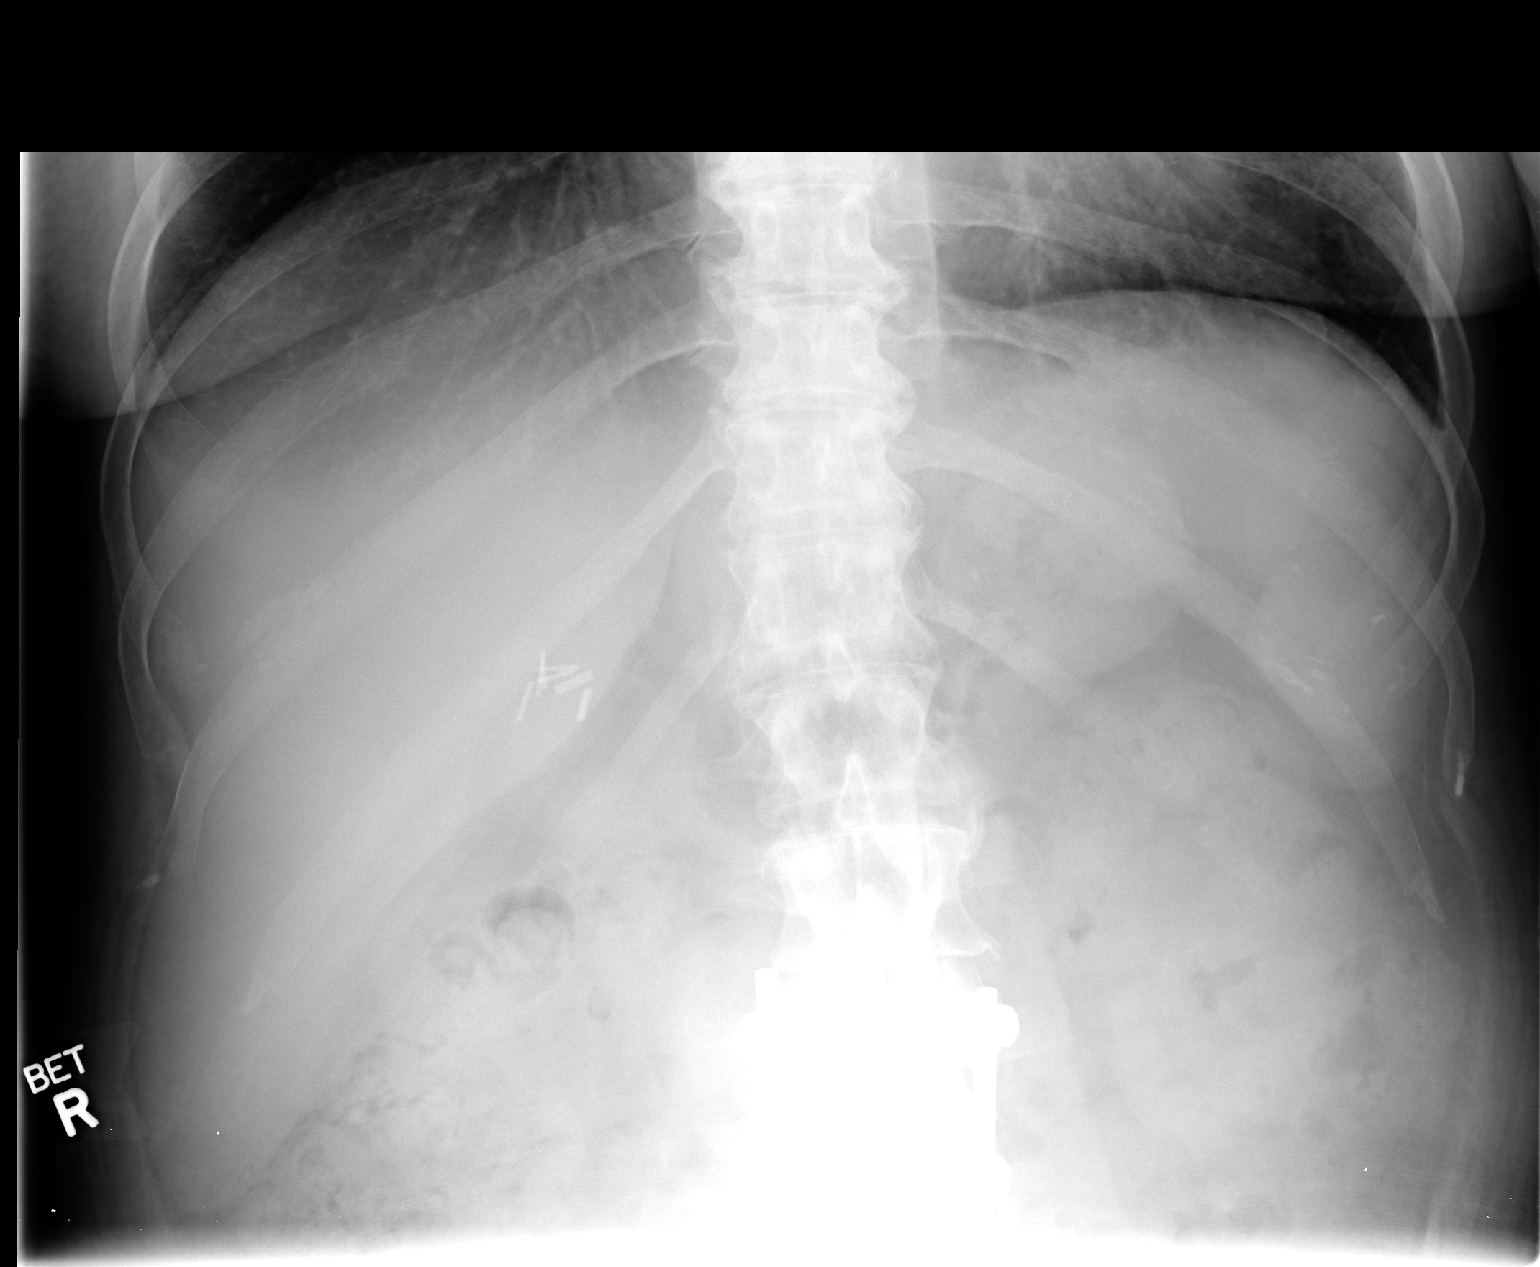

[view not recorded (3 of 3)]
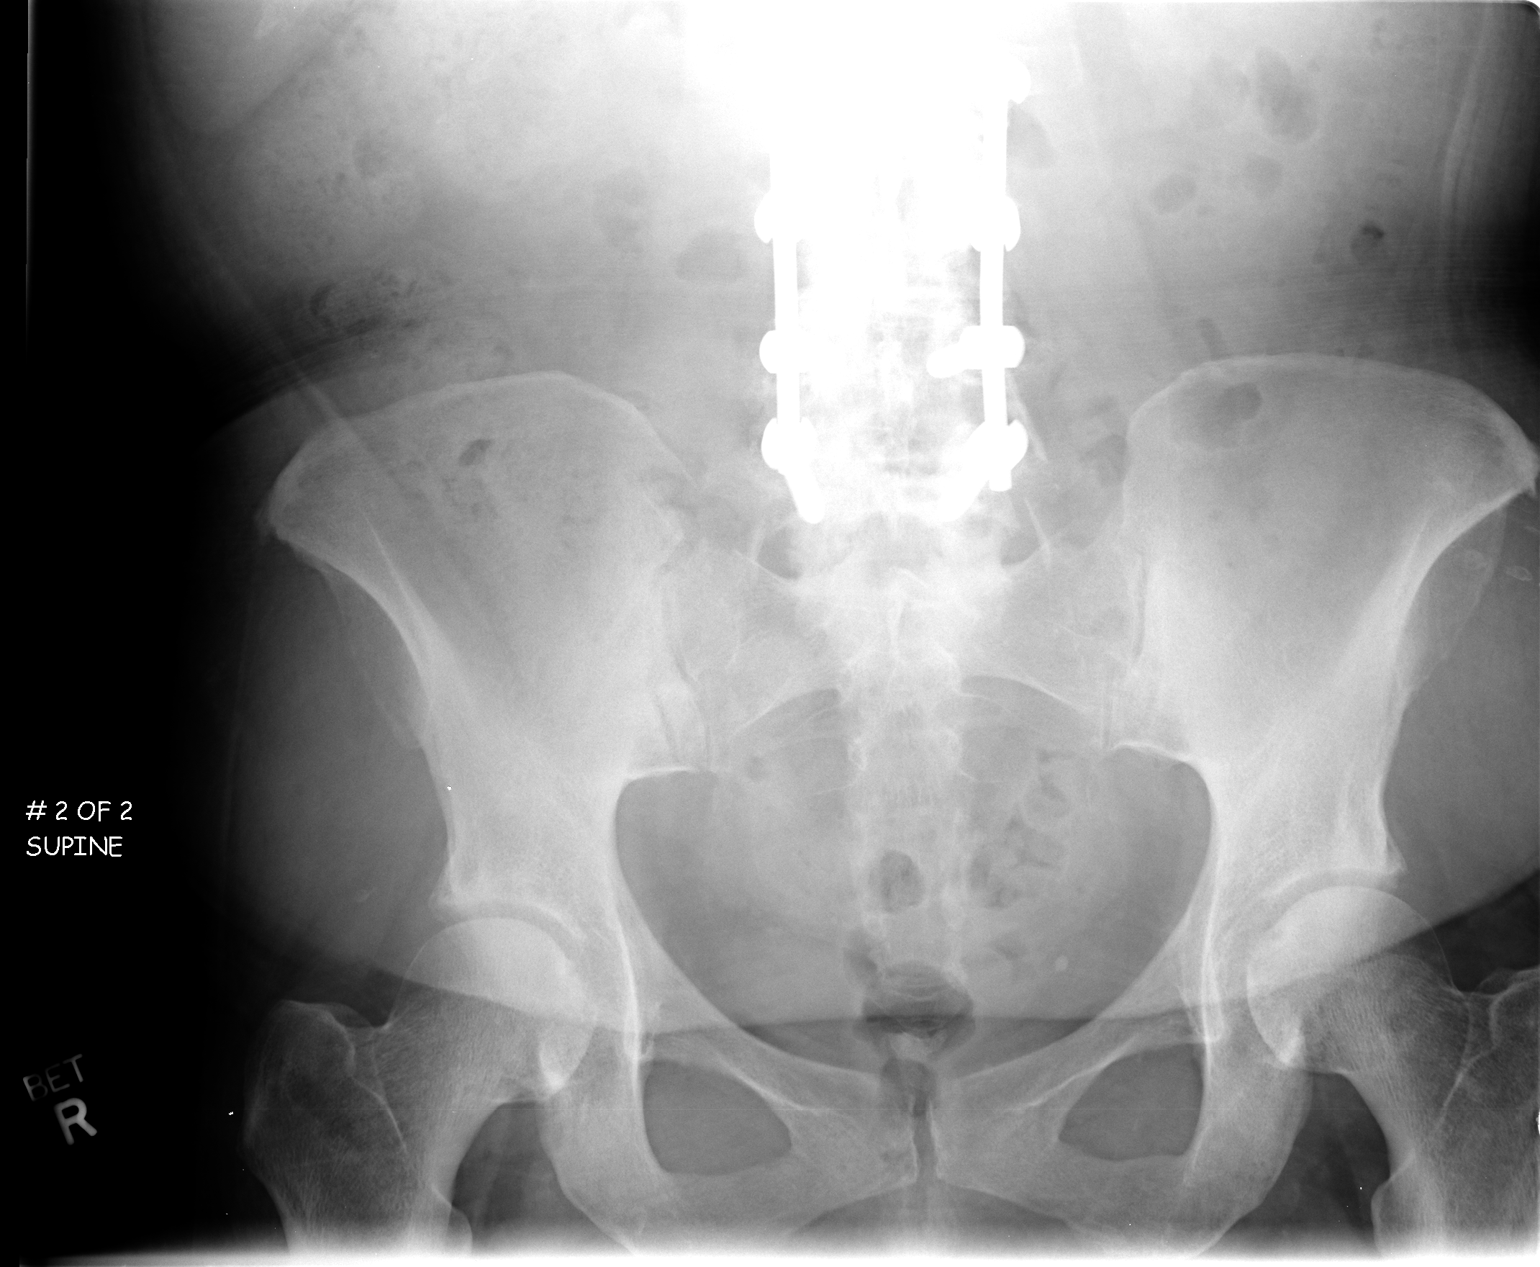

[3 of 3 positions shown; findings below may reference images not displayed]

FINDINGS: The bowel gas pattern is unremarkable.
A small amount of stool and gas in the colon and rectum are noted.
No dilated bowel loops or pneumoperitoneum identified.
Cholecystectomy and previous lumbar surgery again noted.
No acute bony abnormalities are identified.
IMPRESSION: No evidence of acute abnormality.

Unremarkable bowel gas pattern.

## 2010-08-16 ENCOUNTER — Ambulatory Visit (HOSPITAL_COMMUNITY): Payer: Self-pay | Admitting: Nephrology

## 2010-08-23 ENCOUNTER — Encounter (HOSPITAL_COMMUNITY)
Admission: RE | Admit: 2010-08-23 | Discharge: 2010-09-22 | Payer: Self-pay | Source: Home / Self Care | Attending: Nephrology | Admitting: Nephrology

## 2010-08-31 ENCOUNTER — Ambulatory Visit: Admit: 2010-08-31 | Payer: Self-pay | Admitting: Cardiology

## 2010-09-16 ENCOUNTER — Encounter: Payer: Self-pay | Admitting: Neurosurgery

## 2010-09-16 ENCOUNTER — Encounter (INDEPENDENT_AMBULATORY_CARE_PROVIDER_SITE_OTHER): Payer: Self-pay | Admitting: Internal Medicine

## 2010-09-17 ENCOUNTER — Encounter: Payer: Self-pay | Admitting: Neurology

## 2010-09-17 ENCOUNTER — Encounter: Payer: Self-pay | Admitting: Internal Medicine

## 2010-09-17 ENCOUNTER — Encounter: Payer: Self-pay | Admitting: Family Medicine

## 2010-09-28 NOTE — Letter (Signed)
Summary: Discharge Summary  Discharge Summary   Imported By: Dorise Hiss 12/02/2009 10:36:21  _____________________________________________________________________  External Attachment:    Type:   Image     Comment:   External Document

## 2010-09-28 NOTE — Medication Information (Signed)
Summary: Tax adviser   Imported By: Diana Eves 08/29/2009 10:12:38  _____________________________________________________________________  External Attachment:    Type:   Image     Comment:   External Document  Appended Document: RX Folder she see's dr. Karilyn Cota  Appended Document: RX Folder informed pharmacy this is NUR pt

## 2010-09-28 NOTE — Progress Notes (Signed)
Summary: Office Visit/ V V OFFICE VISIT  Office Visit/ V V OFFICE VISIT   Imported By: Dorise Hiss 12/02/2009 10:04:55  _____________________________________________________________________  External Attachment:    Type:   Image     Comment:   External Document

## 2010-09-28 NOTE — Progress Notes (Signed)
Summary: Office Visit/ V V OFFICE VISIT  Office Visit/ V V OFFICE VISIT   Imported By: Dorise Hiss 12/02/2009 09:46:48  _____________________________________________________________________  External Attachment:    Type:   Image     Comment:   External Document

## 2010-09-28 NOTE — Progress Notes (Signed)
Summary: Office Visit/ V V OFFICE VISIT  Office Visit/ V V OFFICE VISIT   Imported By: Dorise Hiss 12/02/2009 10:17:22  _____________________________________________________________________  External Attachment:    Type:   Image     Comment:   External Document

## 2010-09-28 NOTE — Letter (Signed)
Summary: Dobutamine Echocardiogram  [facility] [address]   [place], [place] [address]   Phone: [phone]  Fax: [phone]      [facility]  Dobutamine Echocardiogram    [facility]  Appointment Date:_  Appointment Time:_   Your doctor has ordered a Dobutamine echo to help determine the condition of our heart. If you take blood pressure medication, ask your doctor if you should take your medications the day of the procedure. Do not eat and drink 4 hours before the test is scheduled.  You will be asked to undress from the waist up and given a hospital gown to wear, so dress comfortaly from the waist down.  You will need to register at the Outpatient Department at [facility], located at the front enttrance of the hospital. Make sure to bring your insurance information and a form of ID.  Your appointment will last approximately one and one-half hours

## 2010-09-28 NOTE — Consult Note (Signed)
Summary: Consultation Report  Consultation Report   Imported By: Dorise Hiss 12/02/2009 10:30:23  _____________________________________________________________________  External Attachment:    Type:   Image     Comment:   External Document

## 2010-09-28 NOTE — Consult Note (Signed)
Summary: Consultation Report  Consultation Report   Imported By: Dorise Hiss 12/02/2009 10:39:53  _____________________________________________________________________  External Attachment:    Type:   Image     Comment:   External Document

## 2010-09-28 NOTE — Op Note (Signed)
Summary: Operative Report  Operative Report   Imported By: Dorise Hiss 12/02/2009 09:59:36  _____________________________________________________________________  External Attachment:    Type:   Image     Comment:   External Document

## 2010-09-28 NOTE — Assessment & Plan Note (Signed)
Summary: [name], rec'd letter  --agh   Visit Type:  Follow-up Primary Provider:  Margo Common  CC:  follow-up visit.  History of Present Illness: the patient is a 40 year old female with history of mood disorder, IBS coronary artery disease status post prior stenting and mild LV dysfunction with ejection fraction 49%. Recently the patient was hospitalized for nausea and vomiting. Also abdominal pain. She had prolonged hospitalization and she was found to be in renal insufficiency. The patient is approaching hemodialysis and underwent placement of a AV graft which failed initially. Is currently not on dialysis yet. Apparently Coumadin was discontinued and the patient is currently on Lovenox. She has a recent DVT and pulmonary embolism in the setting of nephrotic syndrome. The patient also reports increased lower extremity edema as well increased shortness of breath. Just for some audible wheezing. She reports chest pain on exertion. She denies any orthopnea PND palpitations or syncope   Preventive Screening-Counseling & Management  Alcohol-Tobacco     Smoking Status: never     Smoking Cessation Counseling: yes     Packs/Day: 1 PPD  Current Problems (verified): 1)  Pulmonary Embolism, Hx of  (ICD-V12.51) 2)  Dvt  (ICD-453.40) 3)  Shortness of Breath  (ICD-786.05) 4)  Precordial Pain  (ICD-786.51) 5)  Hypertension, Unspecified  (ICD-401.9) 6)  Edema  (ICD-782.3) 7)  Cad, Native Vessel  (ICD-414.01) 8)  Foreign Body, Leg  (ICD-916.6) 9)  Hip Thi Leg&ank Sup Fb w/o Maj Opn Wnd&w/o Inf  (ICD-916.6) 10)  High Blood Pressure  (ICD-V12.50) 11)  Diabetes  (ICD-250.00)  Current Medications (verified): 1)  Humalog 100 Unit/ml Soln (Insulin Lispro (Human)) .... Use As Directed Per Slideing Scale 2)  Lantus 100 Unit/ml Soln (Insulin Glargine) .... 50u Subcutaneously Two Times A Day 3)  Nexium 40 Mg Cpdr (Esomeprazole Magnesium) .... Take 1 Tablet By Mouth Once A Day 4)  Plavix 75 Mg Tabs (Clopidogrel  Bisulfate) .... Take 1 Tablet By Mouth Once A Day 5)  Coreg 12.5 Mg Tabs (Carvedilol) .... Take 1 Tablet By Mouth Twice A Day 6)  Isosorbide Mononitrate 20 Mg Tabs (Isosorbide Mononitrate) .... Take 2 Tablet By Mouth Once A Day 7)  Lasix 40 Mg Tabs (Furosemide) .... Take 1 Tablet By Mouth Once A Day 8)  Nitroglycerin 0.4 Mg Subl (Nitroglycerin) .... Place 1 Tablet Under Tongue As Directed 9)  Amlodipine Besylate 10 Mg Tabs (Amlodipine Besylate) .... Take 1 Tablet By Mouth Once A Day 10)  Sodium Bicarbonate 650 Mg Tabs (Sodium Bicarbonate) .... Take 1 Tablet By Mouth Two Times A Day 11)  Citalopram Hydrobromide 40 Mg Tabs (Citalopram Hydrobromide) .... Take 2 Tablet By Mouth Once A Day 12)  Clonidine Hcl 0.3 Mg Tabs (Clonidine Hcl) .... Take 1 Tablet By Mouth Two Times A Day 13)  Potassium Chloride Crys Cr 20 Meq Cr-Tabs (Potassium Chloride Crys Cr) .... Take 2 Tablet By Mouth Two Times A Day 14)  Renvela 800 Mg Tabs (Sevelamer Carbonate) .... Take 1 Tablet By Mouth Four Times A Day 15)  Sucralfate 1 Gm Tabs (Sucralfate) .... Take 1 Tablet By Mouth Two Times A Day Before Meals 16)  Zofran 4 Mg Tabs (Ondansetron Hcl) .... Take One By Mouth Befoe Meals and At Bedtime 17)  Lisinopril 40 Mg Tabs (Lisinopril) .... Take 1 Tablet By Mouth Once A Day 18)  Aspir-Low 81 Mg Tbec (Aspirin) .... Take 1 Tablet By Mouth Once A Day 19)  Folic Acid 1 Mg Tabs (Folic Acid) .... Take 1 Tablet  By Mouth Once A Day 20)  Calcitriol 0.25 Mcg Caps (Calcitriol) .... Take 1 Tablet By Mouth Once A Day 21)  Metolazone 5 Mg Tabs (Metolazone) .... Take 1 Tablet By Mouth Two Times A Day 22)  Nu-Iron 150 Mg Caps (Polysaccharide Iron Complex) .... Take 1 Tablet By Mouth Two Times A Day 23)  Nortriptyline Hcl 25 Mg Caps (Nortriptyline Hcl) .... Take 1 Tablet By Mouth Once A Day At Bedtime 24)  Lovenox 100 Mg/ml Soln (Enoxaparin Sodium) .... One Injectiond Subcutaneously Daily 25)  Naftin 1 % Crea (Naftifine Hcl) .... Apply  Topically As Directed 26)  Percocet 7.5-500 Mg Tabs (Oxycodone-Acetaminophen) .... Take One By Mouth Every 4-6 Hours As Needed Pain 27)  Alprazolam 1 Mg Tabs (Alprazolam) .... Take 1 Tablet By Mouth Four Times A Day  Allergies (verified): 1)  ! * Ivp Dye 2)  ! Morphine 3)  ! * Demoral 4)  ! * Tordol 5)  ! * Compazene  Comments:  Nurse/Medical Assistant: The patient's medications and allergies were reviewed with the patient and were updated in the Medication and Allergy Lists. Bottles reviewed.  Past History:  Past Surgical History: Last updated: 02/18/2008 Back Surgeries Gall Blader Leg Surgery  Family History: Last updated: 02/19/2008 diabetes  Past Medical History: HYPERTENSION, UNSPECIFIED (ICD-401.9) EDEMA (ICD-782.3) CAD, NATIVE VESSEL (ICD-414.01) FOREIGN BODY, LEG (ICD-916.6) HIP THI LEG&ANK SUP FB W/O MAJ OPN WND&W/O INF (ICD-916.6) HIGH BLOOD PRESSURE (ICD-V12.50) DIABETES (ICD-250.00) Migraines Acid Reflux Diabetes mellitus.  Functional dyspepsia.  Irritable bowel syndrome.  Depression and bipolar disorder.  Gastroporesesis Nephrotic syndrome PE and DVT Anemia Stage IV chronic kidney disease, currently not on dialysis yet  Social History: Tobacco Use - No.  Alcohol Use - no Packs/Day:  1 PPD Smoking Status:  never  Review of Systems       The patient complains of fatigue, chest pain, shortness of breath, nausea, leg swelling, dizziness, and anxiety.  The patient denies malaise, fever, weight gain/loss, vision loss, decreased hearing, hoarseness, palpitations, prolonged cough, wheezing, sleep apnea, coughing up blood, abdominal pain, blood in stool, vomiting, diarrhea, heartburn, incontinence, blood in urine, muscle weakness, joint pain, rash, skin lesions, headache, fainting, depression, enlarged lymph nodes, easy bruising or bleeding, and environmental allergies.    Vital Signs:  Patient profile:   40 year old female Height:      70.5  inches Weight:      215 pounds BMI:     30.52 Pulse rate:   88 / minute BP sitting:   174 / 83  (left arm) Cuff size:   large  Vitals Entered By: Carlye Grippe (December 05, 2009 11:19 AM)  Nutrition Counseling: Patient's BMI is greater than 25 and therefore counseled on weight management options. CC: follow-up visit   Physical Exam  Additional Exam:  General: Well-developed, well-nourished in no distress head: Normocephalic and atraumatic eyes PERRLA/EOMI intact, conjunctiva and lids normal nose: No deformity or lesions mouth normal dentition, normal posterior pharynx neck: Supple, no JVD.  No masses, thyromegaly or abnormal cervical nodes lungs: Normal breath sounds bilaterally without wheezing.  Normal percussion heart: regular rate and rhythm with normal S1 and S2, no S3 or S4.  PMI is normal.  No pathological murmurs abdomen: Normal bowel sounds, abdomen is soft and nontender without masses, organomegaly or hernias noted.  No hepatosplenomegaly musculoskeletal: Back normal, normal gait muscle strength and tone normal pulsus: Pulse is normal in all 4 extremities Extremities: No peripheral pitting edema neurologic: Alert and oriented x  3 skin: Intact without lesions or rashes cervical nodes: No significant adenopathy psychologic: Normal affect    Impression & Recommendations:  Problem # 1:  CAD, NATIVE VESSEL (ICD-414.01) the patient reports chest pain. We'll refer her for dobutamine stress echocardiogram The following medications were removed from the medication list:    Accupril 40 Mg Tabs (Quinapril hcl) .Marland Kitchen... Take 1 tablet by mouth once a day Her updated medication list for this problem includes:    Plavix 75 Mg Tabs (Clopidogrel bisulfate) .Marland Kitchen... Take 1 tablet by mouth once a day    Coreg 12.5 Mg Tabs (Carvedilol) .Marland Kitchen... Take 1 tablet by mouth twice a day    Isosorbide Mononitrate 20 Mg Tabs (Isosorbide mononitrate) .Marland Kitchen... Take 2 tablet by mouth once a day     Nitroglycerin 0.4 Mg Subl (Nitroglycerin) .Marland Kitchen... Place 1 tablet under tongue as directed    Amlodipine Besylate 10 Mg Tabs (Amlodipine besylate) .Marland Kitchen... Take 1 tablet by mouth once a day    Lisinopril 40 Mg Tabs (Lisinopril) .Marland Kitchen... Take 1 tablet by mouth once a day    Aspir-low 81 Mg Tbec (Aspirin) .Marland Kitchen... Take 1 tablet by mouth once a day    Lovenox 100 Mg/ml Soln (Enoxaparin sodium) ..... One injectiond subcutaneously daily  Orders: Echo- Dobutamine (Dobutamine Echo)  Problem # 2:  PULMONARY EMBOLISM, HX OF (ICD-V12.51) patient is on Lovenox. We will check an anti-10 A. level Her updated medication list for this problem includes:    Plavix 75 Mg Tabs (Clopidogrel bisulfate) .Marland Kitchen... Take 1 tablet by mouth once a day    Aspir-low 81 Mg Tbec (Aspirin) .Marland Kitchen... Take 1 tablet by mouth once a day    Lovenox 100 Mg/ml Soln (Enoxaparin sodium) ..... One injectiond subcutaneously daily  Orders: T- * Misc. Laboratory test [phone])  Problem # 3:  RENAL FAILURE, END STAGE (ICD-585.6) Assessment: Comment Only  Problem # 4:  NEPHROTIC SYNDROME (ICD-581.9) Assessment: Comment Only  Patient Instructions: 1)  Dobutamine Stress Echo (Hold Coreg 24 hours before procedure) 2)  Labwork this afternoon   3)  Nitroglycerin - as needed for severe chest pain.  4)  Follow up in  3 months Prescriptions: NITROGLYCERIN 0.4 MG SUBL (NITROGLYCERIN) Place 1 tablet under tongue as directed  #25 x 3   Entered by:   Hoover Brunette, LPN   Authorized by:   Lewayne Bunting, MD, [facility]   Signed by:   Hoover Brunette, LPN on 98/06/9146   Method used:   Electronically to        [facility] [address] (retail)       [address]       [place], [place]  [address]       Ph: [phone] or [phone]       Fax: [phone]   RxID:   [id number]

## 2010-09-28 NOTE — Progress Notes (Signed)
Summary: Office Visit/ V V OFFICE VISIT  Office Visit/ V V OFFICE VISIT   Imported By: Dorise Hiss 12/02/2009 10:15:22  _____________________________________________________________________  External Attachment:    Type:   Image     Comment:   External Document

## 2010-09-28 NOTE — Progress Notes (Signed)
Summary: Office Visit/ V V OFFICE VISIT  Office Visit/ V V OFFICE VISIT   Imported By: Dorise Hiss 12/02/2009 10:03:15  _____________________________________________________________________  External Attachment:    Type:   Image     Comment:   External Document

## 2010-09-28 NOTE — Progress Notes (Signed)
Summary: Office Visit/ V V OFFICE VISIT  Office Visit/ V V OFFICE VISIT   Imported By: Dorise Hiss 12/02/2009 10:21:02  _____________________________________________________________________  External Attachment:    Type:   Image     Comment:   External Document

## 2010-09-28 NOTE — Progress Notes (Signed)
Summary: Office Visit/ V V OFFICE VISIT  Office Visit/ V V OFFICE VISIT   Imported By: Dorise Hiss 12/02/2009 09:48:57  _____________________________________________________________________  External Attachment:    Type:   Image     Comment:   External Document

## 2010-09-28 NOTE — Letter (Signed)
Summary: Engineer, materials at [facility]  [address]   [place], [place] [address]   Phone: [phone]  Fax: [phone]        December 13, 2009 MRN: [id number]    [name] [address] [place], [place]  [address]    Dear [name],  Your test ordered by Selena Batten has been reviewed by your physician (or physician assistant) and was found to be normal or stable. Your physician (or physician assistant) felt no changes were needed at this time.  ____ Echocardiogram  __X__ Cardiac Stress Test  __X__ Lab Work  ____ Peripheral vascular study of arms, legs or neck  ____ CT scan or X-ray  ____ Lung or Breathing test  ____ Other:   Thank you.   Cyril Loosen, RN, BSN    Duane Boston, M.D., F.A.C.C. Thressa Sheller, M.D., F.A.C.C. Oneal Grout, M.D., F.A.C.C. Cheree Ditto, M.D., F.A.C.C. Daiva Nakayama, M.D., F.A.C.C. Kenney Houseman, M.D., F.A.C.C. Jeanne Ivan, PA-C

## 2010-09-28 NOTE — Op Note (Signed)
Summary: Operative Report  Operative Report   Imported By: Dorise Hiss 12/02/2009 10:23:07  _____________________________________________________________________  External Attachment:    Type:   Image     Comment:   External Document

## 2010-09-29 NOTE — Progress Notes (Signed)
Summary: Office Visit/ V V OFFICE VISIT  Office Visit/ V V OFFICE VISIT   Imported By: Dorise Hiss 12/02/2009 09:50:47  _____________________________________________________________________  External Attachment:    Type:   Image     Comment:   External Document

## 2010-10-04 ENCOUNTER — Encounter (HOSPITAL_COMMUNITY): Payer: Medicare Other

## 2010-10-04 ENCOUNTER — Ambulatory Visit (HOSPITAL_COMMUNITY): Payer: Medicare Other

## 2010-10-07 ENCOUNTER — Emergency Department (HOSPITAL_COMMUNITY): Payer: Medicare Other

## 2010-10-07 ENCOUNTER — Emergency Department (HOSPITAL_COMMUNITY)
Admission: EM | Admit: 2010-10-07 | Discharge: 2010-10-07 | Disposition: A | Discharge status: Home / Self Care | Payer: Medicare Other | Attending: Emergency Medicine | Admitting: Emergency Medicine

## 2010-10-07 DIAGNOSIS — R079 Chest pain, unspecified: Secondary | ICD-10-CM | POA: Insufficient documentation

## 2010-10-07 DIAGNOSIS — I252 Old myocardial infarction: Secondary | ICD-10-CM | POA: Insufficient documentation

## 2010-10-07 DIAGNOSIS — Z86718 Personal history of other venous thrombosis and embolism: Secondary | ICD-10-CM | POA: Insufficient documentation

## 2010-10-07 DIAGNOSIS — I2510 Atherosclerotic heart disease of native coronary artery without angina pectoris: Secondary | ICD-10-CM | POA: Insufficient documentation

## 2010-10-07 DIAGNOSIS — R109 Unspecified abdominal pain: Secondary | ICD-10-CM | POA: Insufficient documentation

## 2010-10-07 DIAGNOSIS — R059 Cough, unspecified: Secondary | ICD-10-CM | POA: Insufficient documentation

## 2010-10-07 DIAGNOSIS — K5289 Other specified noninfective gastroenteritis and colitis: Secondary | ICD-10-CM | POA: Insufficient documentation

## 2010-10-07 DIAGNOSIS — E119 Type 2 diabetes mellitus without complications: Secondary | ICD-10-CM | POA: Insufficient documentation

## 2010-10-07 DIAGNOSIS — I10 Essential (primary) hypertension: Secondary | ICD-10-CM | POA: Insufficient documentation

## 2010-10-07 DIAGNOSIS — R112 Nausea with vomiting, unspecified: Secondary | ICD-10-CM | POA: Insufficient documentation

## 2010-10-07 DIAGNOSIS — Z8614 Personal history of Methicillin resistant Staphylococcus aureus infection: Secondary | ICD-10-CM | POA: Insufficient documentation

## 2010-10-07 DIAGNOSIS — I209 Angina pectoris, unspecified: Secondary | ICD-10-CM | POA: Insufficient documentation

## 2010-10-07 DIAGNOSIS — J449 Chronic obstructive pulmonary disease, unspecified: Secondary | ICD-10-CM | POA: Insufficient documentation

## 2010-10-07 DIAGNOSIS — J4489 Other specified chronic obstructive pulmonary disease: Secondary | ICD-10-CM | POA: Insufficient documentation

## 2010-10-07 DIAGNOSIS — R05 Cough: Secondary | ICD-10-CM | POA: Insufficient documentation

## 2010-10-07 LAB — POCT CARDIAC MARKERS
CKMB, poc: 1.1 ng/mL (ref 1.0–8.0)
Myoglobin, poc: 293 ng/mL (ref 12–200)
Troponin i, poc: <0.05 ng/mL (ref 0.00–0.09)

## 2010-10-07 LAB — URINALYSIS, ROUTINE W REFLEX MICROSCOPIC
Nitrite: NEGATIVE
Specific Gravity, Urine: >1.03 — ABNORMAL HIGH (ref 1.005–1.030)
Urine Glucose, Fasting: 500 mg/dL — AB
pH: 5.5 (ref 5.0–8.0)

## 2010-10-07 LAB — DIFFERENTIAL
Basophils Relative: 1 % (ref 0–1)
Eosinophils Absolute: 0.2 10*3/uL (ref 0.0–0.7)
Monocytes Absolute: 0.7 10*3/uL (ref 0.1–1.0)
Monocytes Relative: 7 % (ref 3–12)
Neutrophils Relative %: 81 % — ABNORMAL HIGH (ref 43–77)

## 2010-10-07 LAB — COMPREHENSIVE METABOLIC PANEL
ALT: 19 U/L (ref 0–35)
AST: 16 U/L (ref 0–37)
Albumin: 3.3 g/dL — ABNORMAL LOW (ref 3.5–5.2)
Calcium: 9.8 mg/dL (ref 8.4–10.5)
GFR calc Af Amer: 11 mL/min — ABNORMAL LOW (ref >60)
Glucose, Bld: 239 mg/dL — ABNORMAL HIGH (ref 70–99)
Sodium: 131 mEq/L — ABNORMAL LOW (ref 135–145)
Total Protein: 6.3 g/dL (ref 6.0–8.3)

## 2010-10-07 LAB — CBC
MCH: 33.2 pg (ref 26.0–34.0)
MCHC: 35.6 g/dL (ref 30.0–36.0)
Platelets: 200 10*3/uL (ref 150–400)

## 2010-10-07 LAB — URINE MICROSCOPIC-ADD ON

## 2010-10-07 LAB — LIPASE, BLOOD: Lipase: 14 U/L (ref 11–59)

## 2010-10-18 ENCOUNTER — Other Ambulatory Visit (HOSPITAL_COMMUNITY): Payer: Self-pay | Admitting: Nephrology

## 2010-10-18 ENCOUNTER — Ambulatory Visit (INDEPENDENT_AMBULATORY_CARE_PROVIDER_SITE_OTHER): Payer: Medicare Other | Admitting: Internal Medicine

## 2010-10-18 DIAGNOSIS — N186 End stage renal disease: Secondary | ICD-10-CM

## 2010-10-27 ENCOUNTER — Ambulatory Visit (HOSPITAL_COMMUNITY)
Admission: RE | Admit: 2010-10-27 | Discharge: 2010-10-27 | Disposition: A | Discharge status: Home / Self Care | Payer: Medicare Other | Source: Ambulatory Visit | Attending: Nephrology | Admitting: Nephrology

## 2010-10-27 DIAGNOSIS — N186 End stage renal disease: Secondary | ICD-10-CM

## 2010-11-01 ENCOUNTER — Ambulatory Visit (INDEPENDENT_AMBULATORY_CARE_PROVIDER_SITE_OTHER): Payer: Medicare Other | Admitting: Internal Medicine

## 2010-11-01 ENCOUNTER — Other Ambulatory Visit (HOSPITAL_COMMUNITY): Payer: Self-pay | Admitting: Nephrology

## 2010-11-01 ENCOUNTER — Ambulatory Visit (HOSPITAL_COMMUNITY)
Admission: RE | Admit: 2010-11-01 | Discharge: 2010-11-01 | Disposition: A | Discharge status: Home / Self Care | Payer: Medicare Other | Source: Ambulatory Visit | Attending: Nephrology | Admitting: Nephrology

## 2010-11-01 DIAGNOSIS — Y832 Surgical operation with anastomosis, bypass or graft as the cause of abnormal reaction of the patient, or of later complication, without mention of misadventure at the time of the procedure: Secondary | ICD-10-CM | POA: Insufficient documentation

## 2010-11-01 DIAGNOSIS — N186 End stage renal disease: Secondary | ICD-10-CM

## 2010-11-01 DIAGNOSIS — I120 Hypertensive chronic kidney disease with stage 5 chronic kidney disease or end stage renal disease: Secondary | ICD-10-CM | POA: Insufficient documentation

## 2010-11-01 DIAGNOSIS — T82898A Other specified complication of vascular prosthetic devices, implants and grafts, initial encounter: Secondary | ICD-10-CM | POA: Insufficient documentation

## 2010-11-01 DIAGNOSIS — Z992 Dependence on renal dialysis: Secondary | ICD-10-CM | POA: Insufficient documentation

## 2010-11-01 DIAGNOSIS — D649 Anemia, unspecified: Secondary | ICD-10-CM | POA: Insufficient documentation

## 2010-11-01 DIAGNOSIS — N250 Renal osteodystrophy: Secondary | ICD-10-CM | POA: Insufficient documentation

## 2010-11-01 DIAGNOSIS — F17200 Nicotine dependence, unspecified, uncomplicated: Secondary | ICD-10-CM | POA: Insufficient documentation

## 2010-11-01 DIAGNOSIS — E119 Type 2 diabetes mellitus without complications: Secondary | ICD-10-CM | POA: Insufficient documentation

## 2010-11-01 MED ORDER — IOHEXOL 300 MG/ML  SOLN: 100.0000 mL | Freq: Once | INTRAMUSCULAR | Status: AC | PRN

## 2010-11-07 LAB — HEMOGLOBIN A1C: Hgb A1c MFr Bld: 6.8 % — ABNORMAL HIGH (ref <5.7)

## 2010-11-08 LAB — CBC
MCHC: 34.2 g/dL (ref 30.0–36.0)
MCHC: 33.8 g/dL (ref 30.0–36.0)
RDW: 19.4 % — ABNORMAL HIGH (ref 11.5–15.5)
RDW: 20.2 % — ABNORMAL HIGH (ref 11.5–15.5)

## 2010-11-08 LAB — BASIC METABOLIC PANEL
BUN: 26 mg/dL — ABNORMAL HIGH (ref 6–23)
BUN: 31 mg/dL — ABNORMAL HIGH (ref 6–23)
Calcium: 9.5 mg/dL (ref 8.4–10.5)
Calcium: 10.7 mg/dL — ABNORMAL HIGH (ref 8.4–10.5)
Creatinine, Ser: 3.93 mg/dL — ABNORMAL HIGH (ref 0.4–1.2)
Creatinine, Ser: 4.72 mg/dL — ABNORMAL HIGH (ref 0.4–1.2)
GFR calc non Af Amer: 13 mL/min — ABNORMAL LOW (ref >60)
GFR calc non Af Amer: 10 mL/min — ABNORMAL LOW (ref >60)
Glucose, Bld: 145 mg/dL — ABNORMAL HIGH (ref 70–99)
Glucose, Bld: 280 mg/dL — ABNORMAL HIGH (ref 70–99)

## 2010-11-08 LAB — DIFFERENTIAL
Basophils Absolute: 0 10*3/uL (ref 0.0–0.1)
Basophils Relative: 0 % (ref 0–1)
Eosinophils Relative: 1 % (ref 0–5)
Lymphocytes Relative: 16 % (ref 12–46)
Lymphs Abs: 1.4 10*3/uL (ref 0.7–4.0)
Monocytes Absolute: 0.4 10*3/uL (ref 0.1–1.0)
Monocytes Relative: 8 % (ref 3–12)
Neutro Abs: 10.8 10*3/uL — ABNORMAL HIGH (ref 1.7–7.7)
Neutrophils Relative %: 76 % (ref 43–77)
Neutrophils Relative %: 92 % — ABNORMAL HIGH (ref 43–77)

## 2010-11-08 LAB — HEPATIC FUNCTION PANEL
ALT: 25 U/L (ref 0–35)
AST: 45 U/L — ABNORMAL HIGH (ref 0–37)
Albumin: 2.9 g/dL — ABNORMAL LOW (ref 3.5–5.2)
Alkaline Phosphatase: 128 U/L — ABNORMAL HIGH (ref 39–117)
Bilirubin, Direct: 0.1 mg/dL (ref 0.0–0.3)
Total Protein: 5.6 g/dL — ABNORMAL LOW (ref 6.0–8.3)

## 2010-11-08 LAB — GLUCOSE, CAPILLARY

## 2010-11-08 LAB — POCT CARDIAC MARKERS
CKMB, poc: 1.1 ng/mL (ref 1.0–8.0)
CKMB, poc: <1 ng/mL — ABNORMAL LOW (ref 1.0–8.0)
Myoglobin, poc: 244 ng/mL (ref 12–200)
Troponin i, poc: <0.05 ng/mL (ref 0.00–0.09)

## 2010-11-09 ENCOUNTER — Other Ambulatory Visit (HOSPITAL_COMMUNITY): Payer: Self-pay | Admitting: Nephrology

## 2010-11-09 DIAGNOSIS — N186 End stage renal disease: Secondary | ICD-10-CM

## 2010-11-09 LAB — CARDIOLIPIN ANTIBODIES, IGG, IGM, IGA: Anticardiolipin IgA: 4 APL U/mL — ABNORMAL LOW (ref <22)

## 2010-11-09 LAB — LUPUS ANTICOAGULANT PANEL
Lupus Anticoagulant: NOT DETECTED
PTT Lupus Anticoagulant: 41.7 secs (ref 30.0–45.6)

## 2010-11-09 LAB — HOMOCYSTEINE: Homocysteine: 14.5 umol/L (ref 4.0–15.4)

## 2010-11-09 LAB — PROTEIN C, TOTAL: Protein C, Total: 150 % — ABNORMAL HIGH (ref 70–140)

## 2010-11-09 LAB — PROTEIN S, TOTAL: Protein S Ag, Total: 149 % — ABNORMAL HIGH (ref 70–140)

## 2010-11-09 LAB — ANTITHROMBIN III: AntiThromb III Func: 103 % (ref 76–126)

## 2010-11-09 LAB — FACTOR 5 LEIDEN

## 2010-11-10 ENCOUNTER — Ambulatory Visit (HOSPITAL_COMMUNITY)
Admission: RE | Admit: 2010-11-10 | Discharge: 2010-11-10 | Disposition: A | Discharge status: Home / Self Care | Payer: Medicare Other | Source: Ambulatory Visit | Attending: Nephrology | Admitting: Nephrology

## 2010-11-10 ENCOUNTER — Other Ambulatory Visit (HOSPITAL_COMMUNITY): Payer: Self-pay | Admitting: Nephrology

## 2010-11-10 DIAGNOSIS — I5030 Unspecified diastolic (congestive) heart failure: Secondary | ICD-10-CM | POA: Insufficient documentation

## 2010-11-10 DIAGNOSIS — N186 End stage renal disease: Secondary | ICD-10-CM | POA: Insufficient documentation

## 2010-11-10 DIAGNOSIS — K3184 Gastroparesis: Secondary | ICD-10-CM | POA: Insufficient documentation

## 2010-11-10 DIAGNOSIS — F341 Dysthymic disorder: Secondary | ICD-10-CM | POA: Insufficient documentation

## 2010-11-10 DIAGNOSIS — K219 Gastro-esophageal reflux disease without esophagitis: Secondary | ICD-10-CM | POA: Insufficient documentation

## 2010-11-10 DIAGNOSIS — Z86711 Personal history of pulmonary embolism: Secondary | ICD-10-CM | POA: Insufficient documentation

## 2010-11-10 DIAGNOSIS — I120 Hypertensive chronic kidney disease with stage 5 chronic kidney disease or end stage renal disease: Secondary | ICD-10-CM | POA: Insufficient documentation

## 2010-11-10 DIAGNOSIS — Y832 Surgical operation with anastomosis, bypass or graft as the cause of abnormal reaction of the patient, or of later complication, without mention of misadventure at the time of the procedure: Secondary | ICD-10-CM | POA: Insufficient documentation

## 2010-11-10 DIAGNOSIS — T82898A Other specified complication of vascular prosthetic devices, implants and grafts, initial encounter: Secondary | ICD-10-CM | POA: Insufficient documentation

## 2010-11-10 DIAGNOSIS — R110 Nausea: Secondary | ICD-10-CM | POA: Insufficient documentation

## 2010-11-10 DIAGNOSIS — I509 Heart failure, unspecified: Secondary | ICD-10-CM | POA: Insufficient documentation

## 2010-11-10 DIAGNOSIS — Z992 Dependence on renal dialysis: Secondary | ICD-10-CM | POA: Insufficient documentation

## 2010-11-10 DIAGNOSIS — E119 Type 2 diabetes mellitus without complications: Secondary | ICD-10-CM | POA: Insufficient documentation

## 2010-11-10 DIAGNOSIS — I2510 Atherosclerotic heart disease of native coronary artery without angina pectoris: Secondary | ICD-10-CM | POA: Insufficient documentation

## 2010-11-10 LAB — POCT I-STAT 4, (NA,K, GLUC, HGB,HCT)
Glucose, Bld: 230 mg/dL — ABNORMAL HIGH (ref 70–99)
HCT: 35 % — ABNORMAL LOW (ref 36.0–46.0)
Hemoglobin: 11.9 g/dL — ABNORMAL LOW (ref 12.0–15.0)
Potassium: 3.8 mEq/L (ref 3.5–5.1)
Sodium: 139 mEq/L (ref 135–145)

## 2010-11-10 MED ORDER — IOHEXOL 300 MG/ML  SOLN
100.0000 mL | Freq: Once | INTRAMUSCULAR | Status: AC | PRN
  Administered 2010-11-10: 80 mL via INTRAVENOUS

## 2010-11-11 LAB — DIFFERENTIAL
Basophils Absolute: 0.1 10*3/uL (ref 0.0–0.1)
Basophils Relative: 1 % (ref 0–1)
Monocytes Absolute: 0.5 10*3/uL (ref 0.1–1.0)
Neutro Abs: 6.8 10*3/uL (ref 1.7–7.7)

## 2010-11-11 LAB — GLUCOSE, CAPILLARY
Glucose-Capillary: 119 mg/dL — ABNORMAL HIGH (ref 70–99)
Glucose-Capillary: 119 mg/dL — ABNORMAL HIGH (ref 70–99)
Glucose-Capillary: 140 mg/dL — ABNORMAL HIGH (ref 70–99)

## 2010-11-11 LAB — RENAL FUNCTION PANEL
Albumin: 2.2 g/dL — ABNORMAL LOW (ref 3.5–5.2)
BUN: 39 mg/dL — ABNORMAL HIGH (ref 6–23)
Creatinine, Ser: 3.85 mg/dL — ABNORMAL HIGH (ref 0.4–1.2)
Phosphorus: 5.6 mg/dL — ABNORMAL HIGH (ref 2.3–4.6)
Potassium: 4.1 mEq/L (ref 3.5–5.1)

## 2010-11-11 LAB — CBC
HCT: 32.8 % — ABNORMAL LOW (ref 36.0–46.0)
MCHC: 32.2 g/dL (ref 30.0–36.0)
RDW: 18.3 % — ABNORMAL HIGH (ref 11.5–15.5)

## 2010-11-11 LAB — POCT I-STAT 4, (NA,K, GLUC, HGB,HCT): Potassium: 4.9 mEq/L (ref 3.5–5.1)

## 2010-11-12 LAB — CBC
HCT: 28.7 % — ABNORMAL LOW (ref 36.0–46.0)
HCT: 29.3 % — ABNORMAL LOW (ref 36.0–46.0)
HCT: 29.6 % — ABNORMAL LOW (ref 36.0–46.0)
Hemoglobin: 9.3 g/dL — ABNORMAL LOW (ref 12.0–15.0)
Hemoglobin: 9.5 g/dL — ABNORMAL LOW (ref 12.0–15.0)
MCH: 28.1 pg (ref 26.0–34.0)
MCH: 28.4 pg (ref 26.0–34.0)
MCHC: 32 g/dL (ref 30.0–36.0)
MCHC: 32.1 g/dL (ref 30.0–36.0)
MCHC: 32.6 g/dL (ref 30.0–36.0)
MCV: 86.4 fL (ref 78.0–100.0)
MCV: 87.3 fL (ref 78.0–100.0)
MCV: 87.8 fL (ref 78.0–100.0)
MCV: 87.1 fL (ref 78.0–100.0)
Platelets: 356 10*3/uL (ref 150–400)
RBC: 3.34 MIL/uL — ABNORMAL LOW (ref 3.87–5.11)
RBC: 3.39 MIL/uL — ABNORMAL LOW (ref 3.87–5.11)
RBC: 2.89 MIL/uL — ABNORMAL LOW (ref 3.87–5.11)
RDW: 16.9 % — ABNORMAL HIGH (ref 11.5–15.5)
RDW: 17.2 % — ABNORMAL HIGH (ref 11.5–15.5)
WBC: 8.2 10*3/uL (ref 4.0–10.5)

## 2010-11-12 LAB — COMPREHENSIVE METABOLIC PANEL
ALT: 9 U/L (ref 0–35)
Alkaline Phosphatase: 126 U/L — ABNORMAL HIGH (ref 39–117)
Alkaline Phosphatase: 148 U/L — ABNORMAL HIGH (ref 39–117)
BUN: 44 mg/dL — ABNORMAL HIGH (ref 6–23)
BUN: 44 mg/dL — ABNORMAL HIGH (ref 6–23)
BUN: 45 mg/dL — ABNORMAL HIGH (ref 6–23)
CO2: 21 mEq/L (ref 19–32)
CO2: 22 mEq/L (ref 19–32)
CO2: 22 mEq/L (ref 19–32)
Calcium: 8.7 mg/dL (ref 8.4–10.5)
Calcium: 9 mg/dL (ref 8.4–10.5)
Chloride: 107 mEq/L (ref 96–112)
Chloride: 108 mEq/L (ref 96–112)
Creatinine, Ser: 3.89 mg/dL — ABNORMAL HIGH (ref 0.4–1.2)
GFR calc non Af Amer: 13 mL/min — ABNORMAL LOW (ref >60)
GFR calc non Af Amer: 13 mL/min — ABNORMAL LOW (ref >60)
GFR calc non Af Amer: 14 mL/min — ABNORMAL LOW (ref >60)
Glucose, Bld: 137 mg/dL — ABNORMAL HIGH (ref 70–99)
Glucose, Bld: 143 mg/dL — ABNORMAL HIGH (ref 70–99)
Glucose, Bld: 144 mg/dL — ABNORMAL HIGH (ref 70–99)
Potassium: 3.8 mEq/L (ref 3.5–5.1)
Sodium: 137 mEq/L (ref 135–145)
Total Bilirubin: 0.3 mg/dL (ref 0.3–1.2)
Total Bilirubin: 0.4 mg/dL (ref 0.3–1.2)
Total Protein: 5.7 g/dL — ABNORMAL LOW (ref 6.0–8.3)
Total Protein: 5.8 g/dL — ABNORMAL LOW (ref 6.0–8.3)

## 2010-11-12 LAB — GLUCOSE, CAPILLARY
Glucose-Capillary: 117 mg/dL — ABNORMAL HIGH (ref 70–99)
Glucose-Capillary: 127 mg/dL — ABNORMAL HIGH (ref 70–99)
Glucose-Capillary: 133 mg/dL — ABNORMAL HIGH (ref 70–99)
Glucose-Capillary: 154 mg/dL — ABNORMAL HIGH (ref 70–99)

## 2010-11-12 LAB — OVA AND PARASITE EXAMINATION

## 2010-11-12 LAB — URINALYSIS, ROUTINE W REFLEX MICROSCOPIC
Leukocytes, UA: NEGATIVE
Nitrite: NEGATIVE
Protein, ur: >300 mg/dL — AB
Urobilinogen, UA: 0.2 mg/dL (ref 0.0–1.0)

## 2010-11-12 LAB — RAPID URINE DRUG SCREEN, HOSP PERFORMED
Amphetamines: NOT DETECTED
Benzodiazepines: POSITIVE — AB
Cocaine: NOT DETECTED
Opiates: NOT DETECTED
Tetrahydrocannabinol: NOT DETECTED

## 2010-11-12 LAB — DIFFERENTIAL
Basophils Absolute: 0 10*3/uL (ref 0.0–0.1)
Basophils Relative: 0 % (ref 0–1)
Basophils Relative: 0 % (ref 0–1)
Basophils Relative: 1 % (ref 0–1)
Eosinophils Absolute: 0.3 10*3/uL (ref 0.0–0.7)
Eosinophils Absolute: 0.3 10*3/uL (ref 0.0–0.7)
Eosinophils Relative: 3 % (ref 0–5)
Lymphs Abs: 1.1 10*3/uL (ref 0.7–4.0)
Lymphs Abs: 1.3 10*3/uL (ref 0.7–4.0)
Monocytes Relative: 5 % (ref 3–12)
Monocytes Relative: 6 % (ref 3–12)
Monocytes Relative: 7 % (ref 3–12)
Neutro Abs: 6.3 10*3/uL (ref 1.7–7.7)
Neutro Abs: 7.3 10*3/uL (ref 1.7–7.7)
Neutrophils Relative %: 77 % (ref 43–77)
Neutrophils Relative %: 80 % — ABNORMAL HIGH (ref 43–77)

## 2010-11-12 LAB — RENAL FUNCTION PANEL
CO2: 23 mEq/L (ref 19–32)
Calcium: 9 mg/dL (ref 8.4–10.5)
Creatinine, Ser: 4.24 mg/dL — ABNORMAL HIGH (ref 0.4–1.2)
GFR calc Af Amer: 14 mL/min — ABNORMAL LOW (ref >60)
Glucose, Bld: 131 mg/dL — ABNORMAL HIGH (ref 70–99)
Phosphorus: 6.4 mg/dL — ABNORMAL HIGH (ref 2.3–4.6)

## 2010-11-12 LAB — CARDIAC PANEL(CRET KIN+CKTOT+MB+TROPI)
CK, MB: 1.9 ng/mL (ref 0.3–4.0)
CK, MB: 1.9 ng/mL (ref 0.3–4.0)
CK, MB: 2.1 ng/mL (ref 0.3–4.0)
Relative Index: INVALID (ref 0.0–2.5)
Troponin I: 0.06 ng/mL (ref 0.00–0.06)
Troponin I: 0.08 ng/mL — ABNORMAL HIGH (ref 0.00–0.06)
Troponin I: 0.08 ng/mL — ABNORMAL HIGH (ref 0.00–0.06)

## 2010-11-12 LAB — CLOSTRIDIUM DIFFICILE EIA: C difficile Toxins A+B, EIA: NEGATIVE

## 2010-11-12 LAB — LIPASE, BLOOD: Lipase: 18 U/L (ref 11–59)

## 2010-11-12 LAB — HEMOGLOBIN A1C: Hgb A1c MFr Bld: 5.2 % (ref <5.7)

## 2010-11-12 LAB — LIPID PANEL
Cholesterol: 181 mg/dL (ref 0–200)
Total CHOL/HDL Ratio: 3.4 RATIO

## 2010-11-12 LAB — PROTIME-INR
INR: 1.22 (ref 0.00–1.49)
Prothrombin Time: 15.3 seconds — ABNORMAL HIGH (ref 11.6–15.2)

## 2010-11-12 LAB — BRAIN NATRIURETIC PEPTIDE: Pro B Natriuretic peptide (BNP): 683 pg/mL — ABNORMAL HIGH (ref 0.0–100.0)

## 2010-11-12 LAB — PROTEIN / CREATININE RATIO, URINE: Protein Creatinine Ratio: 4.94 — ABNORMAL HIGH (ref 0.00–0.15)

## 2010-11-12 LAB — URINE MICROSCOPIC-ADD ON

## 2010-11-13 LAB — CBC
HCT: 29.1 % — ABNORMAL LOW (ref 36.0–46.0)
MCHC: 32.7 g/dL (ref 30.0–36.0)
MCV: 86.9 fL (ref 78.0–100.0)
Platelets: 339 10*3/uL (ref 150–400)
Platelets: 345 10*3/uL (ref 150–400)
RDW: 17.1 % — ABNORMAL HIGH (ref 11.5–15.5)
RDW: 17.3 % — ABNORMAL HIGH (ref 11.5–15.5)

## 2010-11-13 LAB — URINALYSIS, ROUTINE W REFLEX MICROSCOPIC
Bilirubin Urine: NEGATIVE
Glucose, UA: NEGATIVE mg/dL
Specific Gravity, Urine: 1.025 (ref 1.005–1.030)
Urobilinogen, UA: 0.2 mg/dL (ref 0.0–1.0)

## 2010-11-13 LAB — COMPREHENSIVE METABOLIC PANEL
ALT: 8 U/L (ref 0–35)
AST: 8 U/L (ref 0–37)
Albumin: 2.1 g/dL — ABNORMAL LOW (ref 3.5–5.2)
Alkaline Phosphatase: 106 U/L (ref 39–117)
BUN: 54 mg/dL — ABNORMAL HIGH (ref 6–23)
GFR calc Af Amer: 13 mL/min — ABNORMAL LOW (ref >60)
Potassium: 3.8 mEq/L (ref 3.5–5.1)
Sodium: 137 mEq/L (ref 135–145)
Total Protein: 5.4 g/dL — ABNORMAL LOW (ref 6.0–8.3)

## 2010-11-13 LAB — DIFFERENTIAL
Basophils Relative: 0 % (ref 0–1)
Lymphs Abs: 1.1 10*3/uL (ref 0.7–4.0)
Monocytes Absolute: 0.9 10*3/uL (ref 0.1–1.0)
Monocytes Relative: 5 % (ref 3–12)
Neutro Abs: 16.3 10*3/uL — ABNORMAL HIGH (ref 1.7–7.7)
Neutrophils Relative %: 88 % — ABNORMAL HIGH (ref 43–77)

## 2010-11-13 LAB — URINE MICROSCOPIC-ADD ON

## 2010-11-13 LAB — URINE CULTURE

## 2010-11-14 ENCOUNTER — Emergency Department (HOSPITAL_COMMUNITY)
Admission: EM | Admit: 2010-11-14 | Discharge: 2010-11-14 | Disposition: A | Discharge status: Home / Self Care | Payer: Medicare Other | Attending: Emergency Medicine | Admitting: Emergency Medicine

## 2010-11-14 ENCOUNTER — Emergency Department (HOSPITAL_COMMUNITY): Payer: Medicare Other

## 2010-11-14 DIAGNOSIS — Z79899 Other long term (current) drug therapy: Secondary | ICD-10-CM | POA: Insufficient documentation

## 2010-11-14 DIAGNOSIS — I252 Old myocardial infarction: Secondary | ICD-10-CM | POA: Insufficient documentation

## 2010-11-14 DIAGNOSIS — K3184 Gastroparesis: Secondary | ICD-10-CM | POA: Insufficient documentation

## 2010-11-14 DIAGNOSIS — R42 Dizziness and giddiness: Secondary | ICD-10-CM | POA: Insufficient documentation

## 2010-11-14 DIAGNOSIS — N189 Chronic kidney disease, unspecified: Secondary | ICD-10-CM | POA: Insufficient documentation

## 2010-11-14 DIAGNOSIS — J449 Chronic obstructive pulmonary disease, unspecified: Secondary | ICD-10-CM | POA: Insufficient documentation

## 2010-11-14 DIAGNOSIS — J4489 Other specified chronic obstructive pulmonary disease: Secondary | ICD-10-CM | POA: Insufficient documentation

## 2010-11-14 DIAGNOSIS — K219 Gastro-esophageal reflux disease without esophagitis: Secondary | ICD-10-CM | POA: Insufficient documentation

## 2010-11-14 DIAGNOSIS — R059 Cough, unspecified: Secondary | ICD-10-CM | POA: Insufficient documentation

## 2010-11-14 DIAGNOSIS — R109 Unspecified abdominal pain: Secondary | ICD-10-CM | POA: Insufficient documentation

## 2010-11-14 DIAGNOSIS — R112 Nausea with vomiting, unspecified: Secondary | ICD-10-CM | POA: Insufficient documentation

## 2010-11-14 DIAGNOSIS — S5010XA Contusion of unspecified forearm, initial encounter: Secondary | ICD-10-CM | POA: Insufficient documentation

## 2010-11-14 DIAGNOSIS — Z86711 Personal history of pulmonary embolism: Secondary | ICD-10-CM | POA: Insufficient documentation

## 2010-11-14 DIAGNOSIS — F341 Dysthymic disorder: Secondary | ICD-10-CM | POA: Insufficient documentation

## 2010-11-14 DIAGNOSIS — Y841 Kidney dialysis as the cause of abnormal reaction of the patient, or of later complication, without mention of misadventure at the time of the procedure: Secondary | ICD-10-CM | POA: Insufficient documentation

## 2010-11-14 DIAGNOSIS — R51 Headache: Secondary | ICD-10-CM | POA: Insufficient documentation

## 2010-11-14 DIAGNOSIS — R05 Cough: Secondary | ICD-10-CM | POA: Insufficient documentation

## 2010-11-14 DIAGNOSIS — I129 Hypertensive chronic kidney disease with stage 1 through stage 4 chronic kidney disease, or unspecified chronic kidney disease: Secondary | ICD-10-CM | POA: Insufficient documentation

## 2010-11-14 DIAGNOSIS — R10819 Abdominal tenderness, unspecified site: Secondary | ICD-10-CM | POA: Insufficient documentation

## 2010-11-14 DIAGNOSIS — Y929 Unspecified place or not applicable: Secondary | ICD-10-CM | POA: Insufficient documentation

## 2010-11-14 DIAGNOSIS — E119 Type 2 diabetes mellitus without complications: Secondary | ICD-10-CM | POA: Insufficient documentation

## 2010-11-14 DIAGNOSIS — Z992 Dependence on renal dialysis: Secondary | ICD-10-CM | POA: Insufficient documentation

## 2010-11-14 LAB — RENAL FUNCTION PANEL
Albumin: 2.3 g/dL — ABNORMAL LOW (ref 3.5–5.2)
BUN: 41 mg/dL — ABNORMAL HIGH (ref 6–23)
CO2: 18 mEq/L — ABNORMAL LOW (ref 19–32)
Chloride: 107 mEq/L (ref 96–112)
Glucose, Bld: 204 mg/dL — ABNORMAL HIGH (ref 70–99)
Potassium: 5.5 mEq/L — ABNORMAL HIGH (ref 3.5–5.1)

## 2010-11-14 LAB — COMPREHENSIVE METABOLIC PANEL
ALT: 25 U/L (ref 0–35)
AST: 18 U/L (ref 0–37)
Albumin: 3.1 g/dL — ABNORMAL LOW (ref 3.5–5.2)
Alkaline Phosphatase: 122 U/L — ABNORMAL HIGH (ref 39–117)
Potassium: 3.1 mEq/L — ABNORMAL LOW (ref 3.5–5.1)
Sodium: 134 mEq/L — ABNORMAL LOW (ref 135–145)
Total Protein: 6.5 g/dL (ref 6.0–8.3)

## 2010-11-14 LAB — IRON AND TIBC
Iron: 36 ug/dL — ABNORMAL LOW (ref 42–135)
UIBC: 245 ug/dL

## 2010-11-14 LAB — DIFFERENTIAL
Basophils Absolute: 0 10*3/uL (ref 0.0–0.1)
Eosinophils Absolute: 0.1 10*3/uL (ref 0.0–0.7)
Eosinophils Relative: 1 % (ref 0–5)
Lymphocytes Relative: 6 % — ABNORMAL LOW (ref 12–46)
Neutrophils Relative %: 87 % — ABNORMAL HIGH (ref 43–77)

## 2010-11-14 LAB — POCT I-STAT 4, (NA,K, GLUC, HGB,HCT)
Glucose, Bld: 68 mg/dL — ABNORMAL LOW (ref 70–99)
HCT: 31 % — ABNORMAL LOW (ref 36.0–46.0)
Hemoglobin: 10.5 g/dL — ABNORMAL LOW (ref 12.0–15.0)
Potassium: 4.9 mEq/L (ref 3.5–5.1)
Sodium: 137 mEq/L (ref 135–145)

## 2010-11-14 LAB — CBC
HCT: 29.6 % — ABNORMAL LOW (ref 36.0–46.0)
Hemoglobin: 10.1 g/dL — ABNORMAL LOW (ref 12.0–15.0)
MCV: 84 fL (ref 78.0–100.0)
Platelets: 215 10*3/uL (ref 150–400)
RBC: 3.57 MIL/uL — ABNORMAL LOW (ref 3.87–5.11)
RBC: 3.02 MIL/uL — ABNORMAL LOW (ref 3.87–5.11)
RDW: 15.9 % — ABNORMAL HIGH (ref 11.5–15.5)
WBC: 10.1 10*3/uL (ref 4.0–10.5)
WBC: 11.4 10*3/uL — ABNORMAL HIGH (ref 4.0–10.5)

## 2010-11-14 LAB — GLUCOSE, CAPILLARY
Glucose-Capillary: 111 mg/dL — ABNORMAL HIGH (ref 70–99)
Glucose-Capillary: 67 mg/dL — ABNORMAL LOW (ref 70–99)

## 2010-11-14 LAB — BASIC METABOLIC PANEL
CO2: 22 mEq/L (ref 19–32)
Chloride: 108 mEq/L (ref 96–112)
Creatinine, Ser: 5.24 mg/dL — ABNORMAL HIGH (ref 0.4–1.2)
GFR calc Af Amer: 11 mL/min — ABNORMAL LOW (ref >60)
Potassium: 4.6 mEq/L (ref 3.5–5.1)
Sodium: 137 mEq/L (ref 135–145)

## 2010-11-14 LAB — PROTEIN / CREATININE RATIO, URINE
Protein Creatinine Ratio: 6.55 — ABNORMAL HIGH (ref 0.00–0.15)
Total Protein, Urine: 225 mg/dL

## 2010-11-14 LAB — FERRITIN: Ferritin: 63 ng/mL (ref 10–291)

## 2010-11-15 LAB — GLUCOSE, CAPILLARY
Glucose-Capillary: 107 mg/dL — ABNORMAL HIGH (ref 70–99)
Glucose-Capillary: 46 mg/dL — ABNORMAL LOW (ref 70–99)

## 2010-11-15 LAB — CBC
HCT: 30 % — ABNORMAL LOW (ref 36.0–46.0)
MCV: 83.8 fL (ref 78.0–100.0)
Platelets: 257 10*3/uL (ref 150–400)
RDW: 17 % — ABNORMAL HIGH (ref 11.5–15.5)

## 2010-11-15 LAB — RENAL FUNCTION PANEL
Albumin: 2.5 g/dL — ABNORMAL LOW (ref 3.5–5.2)
Albumin: 2.7 g/dL — ABNORMAL LOW (ref 3.5–5.2)
BUN: 48 mg/dL — ABNORMAL HIGH (ref 6–23)
Calcium: 10.2 mg/dL (ref 8.4–10.5)
Chloride: 109 mEq/L (ref 96–112)
Creatinine, Ser: 3.81 mg/dL — ABNORMAL HIGH (ref 0.4–1.2)
Creatinine, Ser: 4.06 mg/dL — ABNORMAL HIGH (ref 0.4–1.2)
GFR calc Af Amer: 16 mL/min — ABNORMAL LOW (ref >60)
GFR calc non Af Amer: 13 mL/min — ABNORMAL LOW (ref >60)
Glucose, Bld: 110 mg/dL — ABNORMAL HIGH (ref 70–99)
Phosphorus: 5.1 mg/dL — ABNORMAL HIGH (ref 2.3–4.6)

## 2010-11-15 LAB — HEMOGLOBIN AND HEMATOCRIT, BLOOD
HCT: 28.7 % — ABNORMAL LOW (ref 36.0–46.0)
Hemoglobin: 9.7 g/dL — ABNORMAL LOW (ref 12.0–15.0)

## 2010-11-15 LAB — POCT I-STAT 4, (NA,K, GLUC, HGB,HCT)
Potassium: 4 mEq/L (ref 3.5–5.1)
Sodium: 137 mEq/L (ref 135–145)

## 2010-11-19 LAB — PROTEIN / CREATININE RATIO, URINE
Creatinine, Urine: 53.98 mg/dL
Protein Creatinine Ratio: 9.34 — ABNORMAL HIGH (ref 0.00–0.15)
Total Protein, Urine: 504 mg/dL

## 2010-11-19 LAB — RENAL FUNCTION PANEL
CO2: 16 mEq/L — ABNORMAL LOW (ref 19–32)
Chloride: 108 mEq/L (ref 96–112)
Creatinine, Ser: 3.92 mg/dL — ABNORMAL HIGH (ref 0.4–1.2)
GFR calc Af Amer: 15 mL/min — ABNORMAL LOW (ref >60)
GFR calc non Af Amer: 13 mL/min — ABNORMAL LOW (ref >60)
Potassium: 5.2 mEq/L — ABNORMAL HIGH (ref 3.5–5.1)

## 2010-11-27 LAB — RENAL FUNCTION PANEL
BUN: 67 mg/dL — ABNORMAL HIGH (ref 6–23)
CO2: 18 mEq/L — ABNORMAL LOW (ref 19–32)
Calcium: 9.7 mg/dL (ref 8.4–10.5)
Creatinine, Ser: 4.85 mg/dL — ABNORMAL HIGH (ref 0.4–1.2)
Glucose, Bld: 62 mg/dL — ABNORMAL LOW (ref 70–99)
Phosphorus: 6.3 mg/dL — ABNORMAL HIGH (ref 2.3–4.6)
Sodium: 137 mEq/L (ref 135–145)

## 2010-11-27 LAB — HEMOGLOBIN AND HEMATOCRIT, BLOOD: Hemoglobin: 8.3 g/dL — ABNORMAL LOW (ref 12.0–15.0)

## 2010-11-27 NOTE — Consult Note (Signed)
NAMESHAMICKA, [name]              ACCOUNT NO.:  [id number]  MEDICAL RECORD NO.:  [id number]           PATIENT TYPE:  AMB  LOCATION:  [place]                   [facility]  PHYSICIAN:  Lionel December, M.D.    DATE OF BIRTH:  03-12-1971   DATE :  11/01/2010                                CONSULTATION   REASON FOR CONSULT:  Office visit for GERD.  HISTORY OF PRESENT ILLNESS:  [name] is a 40 year old female presenting today with complaints of her stomach burning and nausea.  She does state she has frequent nausea.  She actually states her nausea has been better over the past few weeks.  Her nausea does come and go.  She takes a Phenergan on a daily basis for her nausea.  She does have a history of gastroparesis and takes Reglan 3 times a day at night.  She also is a dialysis patient and takes dialyzes on Tuesday, Thursdays and Saturdays.  She has had a slight weight gain from 184 to 192 but she will have her dialysis tomorrow.  She says that her appetite is so-so. She is having 1 bowel movement a day.  No rectal bleeding.  She has stated that she has joined [employer] and she is doing swimming, aerobics. She has had no side effects from her Reglan.  She would like for [place] to switch her omeprazole to twice a day and her Carafate switched to a liquid.  HOME MEDICATIONS: 1. Sliding scale insulin. 2. Lantus 55 units a day. 3. Plavix 75 mg a day. 4. Phenergan 25 mg IM as needed. 5. Reglan 10 mg t.i.d. and nightly. 6. She is on Xanax 1 mg 4 times a day as needed. 7. Carafate a.c. and nightly. 8. Norvasc 10 mg a day. 9. Diovan 1 a day. 10.Celexa 40 mg twice a day. 11.She is on potassium 20 mEq a day. 12.Percocet as needed. 13.Omeprazole 40 mg once a day.  Please note that she did not bring her medicines in today.  OBJECTIVE:  VITAL SIGNS:  Her weight is 192, her height is 5 feet 10 inches, her temperature is 98, her blood pressure is 146/60 and her pulse is  96. HEENT:  Her sclerae are anicteric.  Her conjunctivae is pink.  The patient does wear dentures. NECK:  Her thyroid is normal. LUNGS:  Clear. HEART:  Regular rate and rhythm. ABDOMEN:  Slightly obese.  Her bowel sounds are positive.  No masses. There is no tenderness. EXTREMITIES:  She does have a small amount of cellulitis to her lower extremities, very small amount of swelling possibly 1+.  She actually looks good today.  ASSESSMENT:  [name] is a 40 year old female with a known history of gastroparesis and gastroesophageal reflux disease.  She also has frequent nausea.  She is maintained on Phenergan for her nausea and Reglan for her gastroparesis and omeprazole for her gastroesophageal reflux disease.  RECOMMENDATIONS:  We will increase her omeprazole 20 mg twice a day dosing and we will give her refills on her Carafate liquid, Reglan, Phenergan and omeprazole and she will follow up in 6 months with labs.  ______________________________ Dorene Ar, NP   ______________________________ Lionel December, M.D.    TS/MEDQ  D:  11/01/2010  T:  11/02/2010  Job:  [id number]  Electronically Signed by Dorene Ar PA on 11/06/2010 09:36:35 AM Electronically Signed by Lionel December M.D. on 11/27/2010 09:49:08 AM

## 2010-11-28 LAB — BASIC METABOLIC PANEL
CO2: 21 mEq/L (ref 19–32)
Chloride: 108 mEq/L (ref 96–112)
GFR calc Af Amer: 17 mL/min — ABNORMAL LOW (ref >60)
Glucose, Bld: 113 mg/dL — ABNORMAL HIGH (ref 70–99)
Potassium: 5.7 mEq/L — ABNORMAL HIGH (ref 3.5–5.1)
Sodium: 137 mEq/L (ref 135–145)

## 2010-11-28 LAB — COMPREHENSIVE METABOLIC PANEL
AST: 11 U/L (ref 0–37)
Albumin: 2.3 g/dL — ABNORMAL LOW (ref 3.5–5.2)
Alkaline Phosphatase: 159 U/L — ABNORMAL HIGH (ref 39–117)
BUN: 43 mg/dL — ABNORMAL HIGH (ref 6–23)
CO2: 20 mEq/L (ref 19–32)
Chloride: 107 mEq/L (ref 96–112)
Creatinine, Ser: 3.91 mg/dL — ABNORMAL HIGH (ref 0.4–1.2)
GFR calc Af Amer: 15 mL/min — ABNORMAL LOW (ref >60)
GFR calc non Af Amer: 13 mL/min — ABNORMAL LOW (ref >60)
Potassium: 5.5 mEq/L — ABNORMAL HIGH (ref 3.5–5.1)
Total Bilirubin: 0.2 mg/dL — ABNORMAL LOW (ref 0.3–1.2)

## 2010-11-28 LAB — DIFFERENTIAL
Basophils Absolute: 0 10*3/uL (ref 0.0–0.1)
Basophils Relative: 1 % (ref 0–1)
Eosinophils Relative: 3 % (ref 0–5)
Monocytes Absolute: 0.7 10*3/uL (ref 0.1–1.0)

## 2010-11-28 LAB — CBC
HCT: 22.3 % — ABNORMAL LOW (ref 36.0–46.0)
HCT: 23.9 % — ABNORMAL LOW (ref 36.0–46.0)
Hemoglobin: 7.5 g/dL — ABNORMAL LOW (ref 12.0–15.0)
MCHC: 33.6 g/dL (ref 30.0–36.0)
MCV: 87.3 fL (ref 78.0–100.0)
Platelets: 311 10*3/uL (ref 150–400)
RBC: 2.53 MIL/uL — ABNORMAL LOW (ref 3.87–5.11)
RBC: 2.74 MIL/uL — ABNORMAL LOW (ref 3.87–5.11)
RDW: 16.4 % — ABNORMAL HIGH (ref 11.5–15.5)
WBC: 9.2 10*3/uL (ref 4.0–10.5)

## 2010-11-28 LAB — LIPASE, BLOOD: Lipase: 11 U/L (ref 11–59)

## 2010-11-28 LAB — IRON AND TIBC: TIBC: 237 ug/dL — ABNORMAL LOW (ref 250–470)

## 2010-11-28 LAB — FERRITIN: Ferritin: 285 ng/mL (ref 10–291)

## 2010-11-29 LAB — BASIC METABOLIC PANEL
BUN: 53 mg/dL — ABNORMAL HIGH (ref 6–23)
BUN: 87 mg/dL — ABNORMAL HIGH (ref 6–23)
CO2: 14 mEq/L — ABNORMAL LOW (ref 19–32)
CO2: 16 mEq/L — ABNORMAL LOW (ref 19–32)
Calcium: 10.2 mg/dL (ref 8.4–10.5)
Calcium: 9.3 mg/dL (ref 8.4–10.5)
Calcium: 9.5 mg/dL (ref 8.4–10.5)
Calcium: 9.9 mg/dL (ref 8.4–10.5)
Chloride: 112 mEq/L (ref 96–112)
Chloride: 114 mEq/L — ABNORMAL HIGH (ref 96–112)
Creatinine, Ser: 3.33 mg/dL — ABNORMAL HIGH (ref 0.4–1.2)
Creatinine, Ser: 3.87 mg/dL — ABNORMAL HIGH (ref 0.4–1.2)
Creatinine, Ser: 6.1 mg/dL — ABNORMAL HIGH (ref 0.4–1.2)
GFR calc Af Amer: 13 mL/min — ABNORMAL LOW (ref >60)
GFR calc Af Amer: 19 mL/min — ABNORMAL LOW (ref >60)
GFR calc non Af Amer: 11 mL/min — ABNORMAL LOW (ref >60)
GFR calc non Af Amer: 15 mL/min — ABNORMAL LOW (ref >60)
Glucose, Bld: 121 mg/dL — ABNORMAL HIGH (ref 70–99)
Glucose, Bld: 84 mg/dL (ref 70–99)
Glucose, Bld: 93 mg/dL (ref 70–99)
Sodium: 138 mEq/L (ref 135–145)

## 2010-11-29 LAB — PROTIME-INR
INR: 1.25 (ref 0.00–1.49)
INR: 1.33 (ref 0.00–1.49)
Prothrombin Time: 20.6 seconds — ABNORMAL HIGH (ref 11.6–15.2)
Prothrombin Time: 35.5 seconds — ABNORMAL HIGH (ref 11.6–15.2)

## 2010-11-29 LAB — GLUCOSE, CAPILLARY
Glucose-Capillary: 114 mg/dL — ABNORMAL HIGH (ref 70–99)
Glucose-Capillary: 127 mg/dL — ABNORMAL HIGH (ref 70–99)
Glucose-Capillary: 171 mg/dL — ABNORMAL HIGH (ref 70–99)
Glucose-Capillary: 282 mg/dL — ABNORMAL HIGH (ref 70–99)
Glucose-Capillary: 51 mg/dL — ABNORMAL LOW (ref 70–99)
Glucose-Capillary: 56 mg/dL — ABNORMAL LOW (ref 70–99)
Glucose-Capillary: 69 mg/dL — ABNORMAL LOW (ref 70–99)
Glucose-Capillary: 74 mg/dL (ref 70–99)
Glucose-Capillary: 79 mg/dL (ref 70–99)
Glucose-Capillary: 88 mg/dL (ref 70–99)
Glucose-Capillary: 98 mg/dL (ref 70–99)

## 2010-11-29 LAB — DIFFERENTIAL
Basophils Absolute: 0 10*3/uL (ref 0.0–0.1)
Basophils Absolute: 0 10*3/uL (ref 0.0–0.1)
Basophils Absolute: 0 10*3/uL (ref 0.0–0.1)
Basophils Relative: 0 % (ref 0–1)
Basophils Relative: 0 % (ref 0–1)
Basophils Relative: 0 % (ref 0–1)
Eosinophils Absolute: 0.1 10*3/uL (ref 0.0–0.7)
Eosinophils Absolute: 0.2 10*3/uL (ref 0.0–0.7)
Eosinophils Absolute: 0.3 10*3/uL (ref 0.0–0.7)
Eosinophils Relative: 3 % (ref 0–5)
Lymphocytes Relative: 11 % — ABNORMAL LOW (ref 12–46)
Lymphs Abs: 1 10*3/uL (ref 0.7–4.0)
Monocytes Absolute: 0.5 10*3/uL (ref 0.1–1.0)
Monocytes Absolute: 0.7 10*3/uL (ref 0.1–1.0)
Monocytes Relative: 5 % (ref 3–12)
Neutro Abs: 12.4 10*3/uL — ABNORMAL HIGH (ref 1.7–7.7)
Neutro Abs: 6.5 10*3/uL (ref 1.7–7.7)
Neutro Abs: 7.7 10*3/uL (ref 1.7–7.7)
Neutrophils Relative %: 78 % — ABNORMAL HIGH (ref 43–77)
Neutrophils Relative %: 80 % — ABNORMAL HIGH (ref 43–77)
Neutrophils Relative %: 90 % — ABNORMAL HIGH (ref 43–77)

## 2010-11-29 LAB — HEPARIN LEVEL (UNFRACTIONATED): Heparin Unfractionated: 0.38 IU/mL (ref 0.30–0.70)

## 2010-11-29 LAB — CBC
HCT: 23.2 % — ABNORMAL LOW (ref 36.0–46.0)
HCT: 24.2 % — ABNORMAL LOW (ref 36.0–46.0)
Hemoglobin: 7.7 g/dL — ABNORMAL LOW (ref 12.0–15.0)
Hemoglobin: 8 g/dL — ABNORMAL LOW (ref 12.0–15.0)
Hemoglobin: 8.2 g/dL — ABNORMAL LOW (ref 12.0–15.0)
MCHC: 34.8 g/dL (ref 30.0–36.0)
MCHC: 35 g/dL (ref 30.0–36.0)
MCHC: 35.1 g/dL (ref 30.0–36.0)
MCV: 85.7 fL (ref 78.0–100.0)
MCV: 85.8 fL (ref 78.0–100.0)
MCV: 86 fL (ref 78.0–100.0)
Platelets: 320 10*3/uL (ref 150–400)
Platelets: 374 10*3/uL (ref 150–400)
RBC: 2.58 MIL/uL — ABNORMAL LOW (ref 3.87–5.11)
RBC: 3.32 MIL/uL — ABNORMAL LOW (ref 3.87–5.11)
RDW: 15.3 % (ref 11.5–15.5)
RDW: 15.6 % — ABNORMAL HIGH (ref 11.5–15.5)
RDW: 16 % — ABNORMAL HIGH (ref 11.5–15.5)
RDW: 16 % — ABNORMAL HIGH (ref 11.5–15.5)
WBC: 10.9 10*3/uL — ABNORMAL HIGH (ref 4.0–10.5)

## 2010-11-29 LAB — HEPATIC FUNCTION PANEL
Albumin: 2.3 g/dL — ABNORMAL LOW (ref 3.5–5.2)
Total Bilirubin: 0.5 mg/dL (ref 0.3–1.2)

## 2010-11-29 LAB — URINALYSIS, ROUTINE W REFLEX MICROSCOPIC
Bilirubin Urine: NEGATIVE
Glucose, UA: 100 mg/dL — AB
Ketones, ur: NEGATIVE mg/dL
Nitrite: NEGATIVE
Protein, ur: 100 mg/dL — AB
pH: 5.5 (ref 5.0–8.0)

## 2010-11-29 LAB — CROSSMATCH: Antibody Screen: NEGATIVE

## 2010-11-29 LAB — AMYLASE
Amylase: 31 U/L (ref 27–131)
Amylase: 31 U/L (ref 27–131)

## 2010-11-29 LAB — LIPASE, BLOOD
Lipase: 107 U/L — ABNORMAL HIGH (ref 11–59)
Lipase: 19 U/L (ref 11–59)
Lipase: 25 U/L (ref 11–59)

## 2010-11-29 LAB — POCT CARDIAC MARKERS
Myoglobin, poc: >500 ng/mL (ref 12–200)
Troponin i, poc: <0.05 ng/mL (ref 0.00–0.09)

## 2010-11-29 LAB — IRON AND TIBC
Saturation Ratios: 33 % (ref 20–55)
UIBC: 113 ug/dL

## 2010-11-30 LAB — CBC
HCT: 31.1 % — ABNORMAL LOW (ref 36.0–46.0)
HCT: 22.4 % — ABNORMAL LOW (ref 36.0–46.0)
HCT: 31.1 % — ABNORMAL LOW (ref 36.0–46.0)
Hemoglobin: 10.8 g/dL — ABNORMAL LOW (ref 12.0–15.0)
Hemoglobin: 7.8 g/dL — CL (ref 12.0–15.0)
MCHC: 35 g/dL (ref 30.0–36.0)
MCHC: 34.3 g/dL (ref 30.0–36.0)
MCHC: 34.6 g/dL (ref 30.0–36.0)
MCHC: 34.8 g/dL (ref 30.0–36.0)
MCV: 86.5 fL (ref 78.0–100.0)
MCV: 86.7 fL (ref 78.0–100.0)
Platelets: 323 10*3/uL (ref 150–400)
Platelets: 207 10*3/uL (ref 150–400)
Platelets: 361 10*3/uL (ref 150–400)
RBC: 3.6 MIL/uL — ABNORMAL LOW (ref 3.87–5.11)
RBC: 3.59 MIL/uL — ABNORMAL LOW (ref 3.87–5.11)
RDW: 15.4 % (ref 11.5–15.5)
RDW: 15.7 % — ABNORMAL HIGH (ref 11.5–15.5)
RDW: 16.1 % — ABNORMAL HIGH (ref 11.5–15.5)
WBC: 7.5 10*3/uL (ref 4.0–10.5)
WBC: 7.5 10*3/uL (ref 4.0–10.5)

## 2010-11-30 LAB — DIFFERENTIAL
Basophils Absolute: 0.1 10*3/uL (ref 0.0–0.1)
Basophils Absolute: 0 10*3/uL (ref 0.0–0.1)
Basophils Relative: 1 % (ref 0–1)
Eosinophils Absolute: 0 10*3/uL (ref 0.0–0.7)
Eosinophils Relative: 4 % (ref 0–5)
Lymphocytes Relative: 12 % (ref 12–46)
Lymphs Abs: 0.9 10*3/uL (ref 0.7–4.0)
Lymphs Abs: 1.1 10*3/uL (ref 0.7–4.0)
Monocytes Absolute: 0.5 10*3/uL (ref 0.1–1.0)
Monocytes Absolute: 0.6 10*3/uL (ref 0.1–1.0)
Monocytes Relative: 5 % (ref 3–12)
Neutro Abs: 5.7 10*3/uL (ref 1.7–7.7)
Neutro Abs: 5 10*3/uL (ref 1.7–7.7)
Neutrophils Relative %: 83 % — ABNORMAL HIGH (ref 43–77)

## 2010-11-30 LAB — GLUCOSE, CAPILLARY
Glucose-Capillary: 112 mg/dL — ABNORMAL HIGH (ref 70–99)
Glucose-Capillary: 115 mg/dL — ABNORMAL HIGH (ref 70–99)
Glucose-Capillary: 173 mg/dL — ABNORMAL HIGH (ref 70–99)
Glucose-Capillary: 264 mg/dL — ABNORMAL HIGH (ref 70–99)
Glucose-Capillary: 60 mg/dL — ABNORMAL LOW (ref 70–99)
Glucose-Capillary: 96 mg/dL (ref 70–99)

## 2010-11-30 LAB — COMPREHENSIVE METABOLIC PANEL
ALT: 12 U/L (ref 0–35)
AST: 10 U/L (ref 0–37)
Albumin: 2.6 g/dL — ABNORMAL LOW (ref 3.5–5.2)
BUN: 51 mg/dL — ABNORMAL HIGH (ref 6–23)
CO2: 17 mEq/L — ABNORMAL LOW (ref 19–32)
Calcium: 10 mg/dL (ref 8.4–10.5)
Calcium: 9.8 mg/dL (ref 8.4–10.5)
Chloride: 102 mEq/L (ref 96–112)
Creatinine, Ser: 4.98 mg/dL — ABNORMAL HIGH (ref 0.4–1.2)
GFR calc Af Amer: 12 mL/min — ABNORMAL LOW (ref >60)
GFR calc Af Amer: 18 mL/min — ABNORMAL LOW (ref >60)
GFR calc non Af Amer: 10 mL/min — ABNORMAL LOW (ref >60)
Glucose, Bld: 361 mg/dL — ABNORMAL HIGH (ref 70–99)
Sodium: 131 mEq/L — ABNORMAL LOW (ref 135–145)
Total Bilirubin: 0.4 mg/dL (ref 0.3–1.2)
Total Protein: 6.4 g/dL (ref 6.0–8.3)

## 2010-11-30 LAB — PROTIME-INR
INR: 1.66 — ABNORMAL HIGH (ref 0.00–1.49)
INR: 1.2 (ref 0.00–1.49)
INR: 1.41 (ref 0.00–1.49)
Prothrombin Time: 19.5 seconds — ABNORMAL HIGH (ref 11.6–15.2)

## 2010-11-30 LAB — BASIC METABOLIC PANEL
BUN: 35 mg/dL — ABNORMAL HIGH (ref 6–23)
CO2: 22 mEq/L (ref 19–32)
Calcium: 8.7 mg/dL (ref 8.4–10.5)
Calcium: 9.3 mg/dL (ref 8.4–10.5)
Creatinine, Ser: 3.08 mg/dL — ABNORMAL HIGH (ref 0.4–1.2)
GFR calc Af Amer: 20 mL/min — ABNORMAL LOW (ref >60)
GFR calc non Af Amer: 17 mL/min — ABNORMAL LOW (ref >60)
GFR calc non Af Amer: 17 mL/min — ABNORMAL LOW (ref >60)
Potassium: 2.8 mEq/L — ABNORMAL LOW (ref 3.5–5.1)
Sodium: 141 mEq/L (ref 135–145)

## 2010-11-30 LAB — CROSSMATCH

## 2010-11-30 LAB — PROTEIN / CREATININE RATIO, URINE
Creatinine, Urine: 85.16 mg/dL
Protein Creatinine Ratio: 3.26 — ABNORMAL HIGH (ref 0.00–0.15)
Total Protein, Urine: 278 mg/dL

## 2010-12-01 LAB — DIFFERENTIAL
Basophils Absolute: 0 10*3/uL (ref 0.0–0.1)
Basophils Absolute: 0.1 10*3/uL (ref 0.0–0.1)
Basophils Relative: 1 % (ref 0–1)
Basophils Relative: 1 % (ref 0–1)
Eosinophils Absolute: 0.3 10*3/uL (ref 0.0–0.7)
Eosinophils Relative: 2 % (ref 0–5)
Lymphocytes Relative: 17 % (ref 12–46)
Lymphocytes Relative: 19 % (ref 12–46)
Lymphocytes Relative: 5 % — ABNORMAL LOW (ref 12–46)
Lymphs Abs: 1.5 10*3/uL (ref 0.7–4.0)
Lymphs Abs: 1.6 10*3/uL (ref 0.7–4.0)
Monocytes Absolute: 0.3 10*3/uL (ref 0.1–1.0)
Monocytes Absolute: 0.5 10*3/uL (ref 0.1–1.0)
Monocytes Absolute: 0.6 10*3/uL (ref 0.1–1.0)
Monocytes Relative: 2 % — ABNORMAL LOW (ref 3–12)
Monocytes Relative: 7 % (ref 3–12)
Monocytes Relative: 8 % (ref 3–12)
Neutro Abs: 11.6 10*3/uL — ABNORMAL HIGH (ref 1.7–7.7)
Neutro Abs: 6.1 10*3/uL (ref 1.7–7.7)
Neutro Abs: 6.4 10*3/uL (ref 1.7–7.7)
Neutrophils Relative %: 72 % (ref 43–77)
Neutrophils Relative %: 73 % (ref 43–77)
Neutrophils Relative %: 92 % — ABNORMAL HIGH (ref 43–77)

## 2010-12-01 LAB — BASIC METABOLIC PANEL
BUN: 26 mg/dL — ABNORMAL HIGH (ref 6–23)
BUN: 36 mg/dL — ABNORMAL HIGH (ref 6–23)
CO2: 19 mEq/L (ref 19–32)
Calcium: 10.8 mg/dL — ABNORMAL HIGH (ref 8.4–10.5)
Calcium: 9.4 mg/dL (ref 8.4–10.5)
Calcium: 9.9 mg/dL (ref 8.4–10.5)
Chloride: 110 mEq/L (ref 96–112)
Creatinine, Ser: 2.58 mg/dL — ABNORMAL HIGH (ref 0.4–1.2)
Creatinine, Ser: 2.76 mg/dL — ABNORMAL HIGH (ref 0.4–1.2)
Creatinine, Ser: 2.79 mg/dL — ABNORMAL HIGH (ref 0.4–1.2)
Creatinine, Ser: 2.86 mg/dL — ABNORMAL HIGH (ref 0.4–1.2)
GFR calc Af Amer: 23 mL/min — ABNORMAL LOW (ref >60)
GFR calc Af Amer: 23 mL/min — ABNORMAL LOW (ref >60)
GFR calc Af Amer: 25 mL/min — ABNORMAL LOW (ref >60)
GFR calc non Af Amer: 19 mL/min — ABNORMAL LOW (ref >60)
GFR calc non Af Amer: 20 mL/min — ABNORMAL LOW (ref >60)
GFR calc non Af Amer: 21 mL/min — ABNORMAL LOW (ref >60)
Glucose, Bld: 237 mg/dL — ABNORMAL HIGH (ref 70–99)
Glucose, Bld: 276 mg/dL — ABNORMAL HIGH (ref 70–99)
Glucose, Bld: 324 mg/dL — ABNORMAL HIGH (ref 70–99)
Potassium: 4.3 mEq/L (ref 3.5–5.1)
Potassium: 5.4 mEq/L — ABNORMAL HIGH (ref 3.5–5.1)
Sodium: 130 mEq/L — ABNORMAL LOW (ref 135–145)
Sodium: 132 mEq/L — ABNORMAL LOW (ref 135–145)
Sodium: 140 mEq/L (ref 135–145)

## 2010-12-01 LAB — GLUCOSE, CAPILLARY
Glucose-Capillary: 142 mg/dL — ABNORMAL HIGH (ref 70–99)
Glucose-Capillary: 182 mg/dL — ABNORMAL HIGH (ref 70–99)
Glucose-Capillary: 196 mg/dL — ABNORMAL HIGH (ref 70–99)
Glucose-Capillary: 217 mg/dL — ABNORMAL HIGH (ref 70–99)
Glucose-Capillary: 296 mg/dL — ABNORMAL HIGH (ref 70–99)

## 2010-12-01 LAB — URINALYSIS, ROUTINE W REFLEX MICROSCOPIC
Leukocytes, UA: NEGATIVE
Specific Gravity, Urine: 1.02 (ref 1.005–1.030)
Urobilinogen, UA: 0.2 mg/dL (ref 0.0–1.0)

## 2010-12-01 LAB — CBC
HCT: 24.6 % — ABNORMAL LOW (ref 36.0–46.0)
HCT: 26.8 % — ABNORMAL LOW (ref 36.0–46.0)
Hemoglobin: 10 g/dL — ABNORMAL LOW (ref 12.0–15.0)
Hemoglobin: 11.2 g/dL — ABNORMAL LOW (ref 12.0–15.0)
Hemoglobin: 9.4 g/dL — ABNORMAL LOW (ref 12.0–15.0)
MCHC: 34.9 g/dL (ref 30.0–36.0)
MCV: 88.8 fL (ref 78.0–100.0)
Platelets: 391 10*3/uL (ref 150–400)
Platelets: 350 10*3/uL (ref 150–400)
RBC: 2.99 MIL/uL — ABNORMAL LOW (ref 3.87–5.11)
RBC: 3.01 MIL/uL — ABNORMAL LOW (ref 3.87–5.11)
RBC: 3.64 MIL/uL — ABNORMAL LOW (ref 3.87–5.11)
RDW: 15.4 % (ref 11.5–15.5)
RDW: 15.4 % (ref 11.5–15.5)
RDW: 16 % — ABNORMAL HIGH (ref 11.5–15.5)
WBC: 8.6 10*3/uL (ref 4.0–10.5)
WBC: 8.9 10*3/uL (ref 4.0–10.5)

## 2010-12-01 LAB — URINE MICROSCOPIC-ADD ON

## 2010-12-01 LAB — PROTIME-INR
INR: 1 (ref 0.00–1.49)
INR: 1.2 (ref 0.00–1.49)
Prothrombin Time: 13.3 seconds (ref 11.6–15.2)

## 2010-12-02 LAB — BASIC METABOLIC PANEL
BUN: 26 mg/dL — ABNORMAL HIGH (ref 6–23)
BUN: 50 mg/dL — ABNORMAL HIGH (ref 6–23)
CO2: 19 mEq/L (ref 19–32)
CO2: 20 mEq/L (ref 19–32)
CO2: 21 mEq/L (ref 19–32)
CO2: 19 mEq/L (ref 19–32)
CO2: 21 mEq/L (ref 19–32)
Calcium: 9.3 mg/dL (ref 8.4–10.5)
Calcium: 9.7 mg/dL (ref 8.4–10.5)
Calcium: 9.2 mg/dL (ref 8.4–10.5)
Calcium: 9.6 mg/dL (ref 8.4–10.5)
Chloride: 107 mEq/L (ref 96–112)
Chloride: 107 mEq/L (ref 96–112)
Chloride: 108 mEq/L (ref 96–112)
Chloride: 110 mEq/L (ref 96–112)
Chloride: 101 mEq/L (ref 96–112)
Creatinine, Ser: 2.34 mg/dL — ABNORMAL HIGH (ref 0.4–1.2)
Creatinine, Ser: 2.49 mg/dL — ABNORMAL HIGH (ref 0.4–1.2)
Creatinine, Ser: 2.63 mg/dL — ABNORMAL HIGH (ref 0.4–1.2)
GFR calc Af Amer: 23 mL/min — ABNORMAL LOW (ref >60)
GFR calc Af Amer: 26 mL/min — ABNORMAL LOW (ref >60)
GFR calc Af Amer: 28 mL/min — ABNORMAL LOW (ref >60)
GFR calc Af Amer: 23 mL/min — ABNORMAL LOW (ref >60)
GFR calc Af Amer: 24 mL/min — ABNORMAL LOW (ref >60)
GFR calc non Af Amer: 24 mL/min — ABNORMAL LOW (ref >60)
GFR calc non Af Amer: 19 mL/min — ABNORMAL LOW (ref >60)
GFR calc non Af Amer: 20 mL/min — ABNORMAL LOW (ref >60)
Glucose, Bld: 118 mg/dL — ABNORMAL HIGH (ref 70–99)
Glucose, Bld: 236 mg/dL — ABNORMAL HIGH (ref 70–99)
Glucose, Bld: 141 mg/dL — ABNORMAL HIGH (ref 70–99)
Potassium: 3.3 mEq/L — ABNORMAL LOW (ref 3.5–5.1)
Potassium: 5.4 mEq/L — ABNORMAL HIGH (ref 3.5–5.1)
Sodium: 134 mEq/L — ABNORMAL LOW (ref 135–145)
Sodium: 136 mEq/L (ref 135–145)
Sodium: 131 mEq/L — ABNORMAL LOW (ref 135–145)
Sodium: 133 mEq/L — ABNORMAL LOW (ref 135–145)
Sodium: 133 mEq/L — ABNORMAL LOW (ref 135–145)

## 2010-12-02 LAB — CBC
HCT: 25.2 % — ABNORMAL LOW (ref 36.0–46.0)
HCT: 24.5 % — ABNORMAL LOW (ref 36.0–46.0)
HCT: 26.9 % — ABNORMAL LOW (ref 36.0–46.0)
Hemoglobin: 9.3 g/dL — ABNORMAL LOW (ref 12.0–15.0)
Hemoglobin: 7.6 g/dL — CL (ref 12.0–15.0)
Hemoglobin: 8.3 g/dL — ABNORMAL LOW (ref 12.0–15.0)
Hemoglobin: 8.8 g/dL — ABNORMAL LOW (ref 12.0–15.0)
Hemoglobin: 9.6 g/dL — ABNORMAL LOW (ref 12.0–15.0)
MCHC: 35.2 g/dL (ref 30.0–36.0)
MCHC: 35.2 g/dL (ref 30.0–36.0)
MCHC: 35.3 g/dL (ref 30.0–36.0)
MCHC: 35.8 g/dL (ref 30.0–36.0)
MCHC: 35.9 g/dL (ref 30.0–36.0)
MCHC: 36 g/dL (ref 30.0–36.0)
MCV: 88.6 fL (ref 78.0–100.0)
MCV: 88.9 fL (ref 78.0–100.0)
MCV: 89.3 fL (ref 78.0–100.0)
MCV: 89.2 fL (ref 78.0–100.0)
Platelets: 366 10*3/uL (ref 150–400)
Platelets: 628 10*3/uL — ABNORMAL HIGH (ref 150–400)
Platelets: 338 10*3/uL (ref 150–400)
Platelets: 374 10*3/uL (ref 150–400)
RBC: 2.12 MIL/uL — ABNORMAL LOW (ref 3.87–5.11)
RBC: 2.98 MIL/uL — ABNORMAL LOW (ref 3.87–5.11)
RBC: 2.6 MIL/uL — ABNORMAL LOW (ref 3.87–5.11)
RBC: 3.01 MIL/uL — ABNORMAL LOW (ref 3.87–5.11)
RDW: 15.4 % (ref 11.5–15.5)
RDW: 15 % (ref 11.5–15.5)
RDW: 15.2 % (ref 11.5–15.5)
RDW: 15.4 % (ref 11.5–15.5)
WBC: 10.3 10*3/uL (ref 4.0–10.5)
WBC: 11.8 10*3/uL — ABNORMAL HIGH (ref 4.0–10.5)
WBC: 11.1 10*3/uL — ABNORMAL HIGH (ref 4.0–10.5)
WBC: 11.9 10*3/uL — ABNORMAL HIGH (ref 4.0–10.5)

## 2010-12-02 LAB — GLUCOSE, CAPILLARY
Glucose-Capillary: 131 mg/dL — ABNORMAL HIGH (ref 70–99)
Glucose-Capillary: 152 mg/dL — ABNORMAL HIGH (ref 70–99)
Glucose-Capillary: 157 mg/dL — ABNORMAL HIGH (ref 70–99)
Glucose-Capillary: 216 mg/dL — ABNORMAL HIGH (ref 70–99)
Glucose-Capillary: 78 mg/dL (ref 70–99)
Glucose-Capillary: 110 mg/dL — ABNORMAL HIGH (ref 70–99)
Glucose-Capillary: 124 mg/dL — ABNORMAL HIGH (ref 70–99)
Glucose-Capillary: 130 mg/dL — ABNORMAL HIGH (ref 70–99)
Glucose-Capillary: 136 mg/dL — ABNORMAL HIGH (ref 70–99)
Glucose-Capillary: 137 mg/dL — ABNORMAL HIGH (ref 70–99)
Glucose-Capillary: 149 mg/dL — ABNORMAL HIGH (ref 70–99)
Glucose-Capillary: 177 mg/dL — ABNORMAL HIGH (ref 70–99)
Glucose-Capillary: 184 mg/dL — ABNORMAL HIGH (ref 70–99)
Glucose-Capillary: 46 mg/dL — ABNORMAL LOW (ref 70–99)
Glucose-Capillary: 46 mg/dL — ABNORMAL LOW (ref 70–99)
Glucose-Capillary: 48 mg/dL — ABNORMAL LOW (ref 70–99)
Glucose-Capillary: 54 mg/dL — ABNORMAL LOW (ref 70–99)
Glucose-Capillary: 63 mg/dL — ABNORMAL LOW (ref 70–99)
Glucose-Capillary: 70 mg/dL (ref 70–99)
Glucose-Capillary: 83 mg/dL (ref 70–99)

## 2010-12-02 LAB — DIFFERENTIAL
Basophils Absolute: 0 10*3/uL (ref 0.0–0.1)
Basophils Absolute: 0 10*3/uL (ref 0.0–0.1)
Basophils Absolute: 0.1 10*3/uL (ref 0.0–0.1)
Basophils Relative: 0 % (ref 0–1)
Basophils Relative: 1 % (ref 0–1)
Basophils Relative: 1 % (ref 0–1)
Basophils Relative: 0 % (ref 0–1)
Basophils Relative: 0 % (ref 0–1)
Eosinophils Absolute: 0.1 10*3/uL (ref 0.0–0.7)
Eosinophils Absolute: 0.2 10*3/uL (ref 0.0–0.7)
Eosinophils Absolute: 0.3 10*3/uL (ref 0.0–0.7)
Eosinophils Absolute: 0.3 10*3/uL (ref 0.0–0.7)
Eosinophils Relative: 1 % (ref 0–5)
Eosinophils Relative: 2 % (ref 0–5)
Eosinophils Relative: 3 % (ref 0–5)
Lymphocytes Relative: 9 % — ABNORMAL LOW (ref 12–46)
Lymphocytes Relative: 12 % (ref 12–46)
Lymphocytes Relative: 8 % — ABNORMAL LOW (ref 12–46)
Lymphocytes Relative: 9 % — ABNORMAL LOW (ref 12–46)
Lymphs Abs: 1 10*3/uL (ref 0.7–4.0)
Lymphs Abs: 1.4 10*3/uL (ref 0.7–4.0)
Lymphs Abs: 1.1 10*3/uL (ref 0.7–4.0)
Lymphs Abs: 1.2 10*3/uL (ref 0.7–4.0)
Monocytes Absolute: 0.6 10*3/uL (ref 0.1–1.0)
Monocytes Absolute: 0.6 10*3/uL (ref 0.1–1.0)
Monocytes Absolute: 0.7 10*3/uL (ref 0.1–1.0)
Monocytes Absolute: 0.9 10*3/uL (ref 0.1–1.0)
Monocytes Absolute: 1.2 10*3/uL — ABNORMAL HIGH (ref 0.1–1.0)
Monocytes Absolute: 1.3 10*3/uL — ABNORMAL HIGH (ref 0.1–1.0)
Monocytes Absolute: 1.3 10*3/uL — ABNORMAL HIGH (ref 0.1–1.0)
Monocytes Relative: 6 % (ref 3–12)
Monocytes Relative: 8 % (ref 3–12)
Monocytes Relative: 8 % (ref 3–12)
Monocytes Relative: 11 % (ref 3–12)
Monocytes Relative: 11 % (ref 3–12)
Monocytes Relative: 12 % (ref 3–12)
Neutro Abs: 5.7 10*3/uL (ref 1.7–7.7)
Neutro Abs: 8.3 10*3/uL — ABNORMAL HIGH (ref 1.7–7.7)
Neutro Abs: 9.9 10*3/uL — ABNORMAL HIGH (ref 1.7–7.7)
Neutro Abs: 7.6 10*3/uL (ref 1.7–7.7)
Neutro Abs: 9.2 10*3/uL — ABNORMAL HIGH (ref 1.7–7.7)
Neutrophils Relative %: 70 % (ref 43–77)
Neutrophils Relative %: 81 % — ABNORMAL HIGH (ref 43–77)
Neutrophils Relative %: 75 % (ref 43–77)
Neutrophils Relative %: 79 % — ABNORMAL HIGH (ref 43–77)

## 2010-12-02 LAB — URINALYSIS, ROUTINE W REFLEX MICROSCOPIC
Glucose, UA: 250 mg/dL — AB
Leukocytes, UA: NEGATIVE
Specific Gravity, Urine: >1.03 — ABNORMAL HIGH (ref 1.005–1.030)
pH: 6 (ref 5.0–8.0)

## 2010-12-02 LAB — HEPATIC FUNCTION PANEL
Albumin: 2.3 g/dL — ABNORMAL LOW (ref 3.5–5.2)
Indirect Bilirubin: 0.2 mg/dL — ABNORMAL LOW (ref 0.3–0.9)
Total Protein: 5.8 g/dL — ABNORMAL LOW (ref 6.0–8.3)

## 2010-12-02 LAB — COMPREHENSIVE METABOLIC PANEL
AST: 11 U/L (ref 0–37)
Albumin: 2.3 g/dL — ABNORMAL LOW (ref 3.5–5.2)
Alkaline Phosphatase: 230 U/L — ABNORMAL HIGH (ref 39–117)
BUN: 34 mg/dL — ABNORMAL HIGH (ref 6–23)
Chloride: 107 mEq/L (ref 96–112)
Creatinine, Ser: 2.64 mg/dL — ABNORMAL HIGH (ref 0.4–1.2)
GFR calc Af Amer: 24 mL/min — ABNORMAL LOW (ref >60)
Potassium: 4.1 mEq/L (ref 3.5–5.1)
Total Bilirubin: 0.6 mg/dL (ref 0.3–1.2)
Total Protein: 6.1 g/dL (ref 6.0–8.3)

## 2010-12-02 LAB — CROSSMATCH

## 2010-12-02 LAB — PROTIME-INR
INR: 1 (ref 0.00–1.49)
INR: 1 (ref 0.00–1.49)
INR: 1.2 (ref 0.00–1.49)
INR: 1.6 — ABNORMAL HIGH (ref 0.00–1.49)
INR: 2.5 — ABNORMAL HIGH (ref 0.00–1.49)
Prothrombin Time: 14.6 seconds (ref 11.6–15.2)
Prothrombin Time: 20.4 seconds — ABNORMAL HIGH (ref 11.6–15.2)
Prothrombin Time: 27.3 seconds — ABNORMAL HIGH (ref 11.6–15.2)

## 2010-12-02 LAB — LIPASE, BLOOD: Lipase: 10 U/L — ABNORMAL LOW (ref 11–59)

## 2010-12-02 LAB — URINE MICROSCOPIC-ADD ON

## 2010-12-02 LAB — POCT CARDIAC MARKERS
Myoglobin, poc: 216 ng/mL (ref 12–200)
Troponin i, poc: <0.05 ng/mL (ref 0.00–0.09)

## 2010-12-02 LAB — ANA: Anti Nuclear Antibody(ANA): NEGATIVE

## 2010-12-02 LAB — BRAIN NATRIURETIC PEPTIDE: Pro B Natriuretic peptide (BNP): 650 pg/mL — ABNORMAL HIGH (ref 0.0–100.0)

## 2010-12-02 LAB — ANTI-DNA ANTIBODY, DOUBLE-STRANDED: ds DNA Ab: <1 IU/mL (ref <5)

## 2010-12-02 LAB — MAGNESIUM: Magnesium: 2 mg/dL (ref 1.5–2.5)

## 2010-12-03 LAB — GLUCOSE, CAPILLARY
Glucose-Capillary: 146 mg/dL — ABNORMAL HIGH (ref 70–99)
Glucose-Capillary: 149 mg/dL — ABNORMAL HIGH (ref 70–99)
Glucose-Capillary: 244 mg/dL — ABNORMAL HIGH (ref 70–99)
Glucose-Capillary: 248 mg/dL — ABNORMAL HIGH (ref 70–99)
Glucose-Capillary: 72 mg/dL (ref 70–99)
Glucose-Capillary: 81 mg/dL (ref 70–99)
Glucose-Capillary: 96 mg/dL (ref 70–99)
Glucose-Capillary: 111 mg/dL — ABNORMAL HIGH (ref 70–99)
Glucose-Capillary: 117 mg/dL — ABNORMAL HIGH (ref 70–99)
Glucose-Capillary: 118 mg/dL — ABNORMAL HIGH (ref 70–99)
Glucose-Capillary: 120 mg/dL — ABNORMAL HIGH (ref 70–99)
Glucose-Capillary: 134 mg/dL — ABNORMAL HIGH (ref 70–99)
Glucose-Capillary: 138 mg/dL — ABNORMAL HIGH (ref 70–99)
Glucose-Capillary: 148 mg/dL — ABNORMAL HIGH (ref 70–99)
Glucose-Capillary: 153 mg/dL — ABNORMAL HIGH (ref 70–99)
Glucose-Capillary: 163 mg/dL — ABNORMAL HIGH (ref 70–99)
Glucose-Capillary: 174 mg/dL — ABNORMAL HIGH (ref 70–99)
Glucose-Capillary: 178 mg/dL — ABNORMAL HIGH (ref 70–99)
Glucose-Capillary: 182 mg/dL — ABNORMAL HIGH (ref 70–99)
Glucose-Capillary: 190 mg/dL — ABNORMAL HIGH (ref 70–99)
Glucose-Capillary: 200 mg/dL — ABNORMAL HIGH (ref 70–99)
Glucose-Capillary: 214 mg/dL — ABNORMAL HIGH (ref 70–99)
Glucose-Capillary: 230 mg/dL — ABNORMAL HIGH (ref 70–99)
Glucose-Capillary: 232 mg/dL — ABNORMAL HIGH (ref 70–99)
Glucose-Capillary: 248 mg/dL — ABNORMAL HIGH (ref 70–99)
Glucose-Capillary: 249 mg/dL — ABNORMAL HIGH (ref 70–99)
Glucose-Capillary: 253 mg/dL — ABNORMAL HIGH (ref 70–99)
Glucose-Capillary: 257 mg/dL — ABNORMAL HIGH (ref 70–99)
Glucose-Capillary: 269 mg/dL — ABNORMAL HIGH (ref 70–99)
Glucose-Capillary: 321 mg/dL — ABNORMAL HIGH (ref 70–99)
Glucose-Capillary: 327 mg/dL — ABNORMAL HIGH (ref 70–99)
Glucose-Capillary: 344 mg/dL — ABNORMAL HIGH (ref 70–99)

## 2010-12-03 LAB — DIFFERENTIAL
Basophils Absolute: 0 10*3/uL (ref 0.0–0.1)
Basophils Absolute: 0.1 10*3/uL (ref 0.0–0.1)
Basophils Absolute: 0 10*3/uL (ref 0.0–0.1)
Basophils Absolute: 0 10*3/uL (ref 0.0–0.1)
Basophils Absolute: 0 10*3/uL (ref 0.0–0.1)
Basophils Absolute: 0 10*3/uL (ref 0.0–0.1)
Basophils Absolute: 0 10*3/uL (ref 0.0–0.1)
Basophils Absolute: 0 10*3/uL (ref 0.0–0.1)
Basophils Absolute: 0.1 10*3/uL (ref 0.0–0.1)
Basophils Absolute: 0.1 10*3/uL (ref 0.0–0.1)
Basophils Relative: 1 % (ref 0–1)
Basophils Relative: 0 % (ref 0–1)
Basophils Relative: 0 % (ref 0–1)
Basophils Relative: 0 % (ref 0–1)
Basophils Relative: 0 % (ref 0–1)
Basophils Relative: 1 % (ref 0–1)
Basophils Relative: 1 % (ref 0–1)
Basophils Relative: 1 % (ref 0–1)
Eosinophils Absolute: 0.3 10*3/uL (ref 0.0–0.7)
Eosinophils Absolute: 0.2 10*3/uL (ref 0.0–0.7)
Eosinophils Absolute: 0.2 10*3/uL (ref 0.0–0.7)
Eosinophils Absolute: 0.2 10*3/uL (ref 0.0–0.7)
Eosinophils Absolute: 0.3 10*3/uL (ref 0.0–0.7)
Eosinophils Absolute: 0.4 10*3/uL (ref 0.0–0.7)
Eosinophils Relative: 3 % (ref 0–5)
Eosinophils Relative: 2 % (ref 0–5)
Eosinophils Relative: 2 % (ref 0–5)
Eosinophils Relative: 3 % (ref 0–5)
Eosinophils Relative: 3 % (ref 0–5)
Eosinophils Relative: 4 % (ref 0–5)
Eosinophils Relative: 5 % (ref 0–5)
Lymphocytes Relative: 10 % — ABNORMAL LOW (ref 12–46)
Lymphocytes Relative: 10 % — ABNORMAL LOW (ref 12–46)
Lymphocytes Relative: 13 % (ref 12–46)
Lymphocytes Relative: 9 % — ABNORMAL LOW (ref 12–46)
Lymphocytes Relative: 14 % (ref 12–46)
Lymphocytes Relative: 15 % (ref 12–46)
Lymphocytes Relative: 16 % (ref 12–46)
Lymphocytes Relative: 17 % (ref 12–46)
Lymphocytes Relative: 18 % (ref 12–46)
Lymphs Abs: 1.2 10*3/uL (ref 0.7–4.0)
Lymphs Abs: 1.2 10*3/uL (ref 0.7–4.0)
Lymphs Abs: 1.3 10*3/uL (ref 0.7–4.0)
Lymphs Abs: 0.8 10*3/uL (ref 0.7–4.0)
Lymphs Abs: 1 10*3/uL (ref 0.7–4.0)
Lymphs Abs: 1 10*3/uL (ref 0.7–4.0)
Lymphs Abs: 1.2 10*3/uL (ref 0.7–4.0)
Lymphs Abs: 1.4 10*3/uL (ref 0.7–4.0)
Monocytes Absolute: 1.1 10*3/uL — ABNORMAL HIGH (ref 0.1–1.0)
Monocytes Absolute: 0.7 10*3/uL (ref 0.1–1.0)
Monocytes Absolute: 0.8 10*3/uL (ref 0.1–1.0)
Monocytes Absolute: 0.8 10*3/uL (ref 0.1–1.0)
Monocytes Absolute: 0.9 10*3/uL (ref 0.1–1.0)
Monocytes Relative: 10 % (ref 3–12)
Monocytes Relative: 6 % (ref 3–12)
Monocytes Relative: 6 % (ref 3–12)
Monocytes Relative: 6 % (ref 3–12)
Monocytes Relative: 6 % (ref 3–12)
Monocytes Relative: 7 % (ref 3–12)
Monocytes Relative: 9 % (ref 3–12)
Neutro Abs: 10.9 10*3/uL — ABNORMAL HIGH (ref 1.7–7.7)
Neutro Abs: 7.5 10*3/uL (ref 1.7–7.7)
Neutro Abs: 11.2 10*3/uL — ABNORMAL HIGH (ref 1.7–7.7)
Neutro Abs: 5.2 10*3/uL (ref 1.7–7.7)
Neutro Abs: 6 10*3/uL (ref 1.7–7.7)
Neutro Abs: 6.4 10*3/uL (ref 1.7–7.7)
Neutro Abs: 6.5 10*3/uL (ref 1.7–7.7)
Neutro Abs: 7.1 10*3/uL (ref 1.7–7.7)
Neutro Abs: 7.2 10*3/uL (ref 1.7–7.7)
Neutrophils Relative %: 75 % (ref 43–77)
Neutrophils Relative %: 77 % (ref 43–77)
Neutrophils Relative %: 80 % — ABNORMAL HIGH (ref 43–77)
Neutrophils Relative %: 69 % (ref 43–77)
Neutrophils Relative %: 69 % (ref 43–77)
Neutrophils Relative %: 71 % (ref 43–77)
Neutrophils Relative %: 73 % (ref 43–77)
Neutrophils Relative %: 76 % (ref 43–77)
Neutrophils Relative %: 77 % (ref 43–77)
Neutrophils Relative %: 80 % — ABNORMAL HIGH (ref 43–77)
Neutrophils Relative %: 84 % — ABNORMAL HIGH (ref 43–77)
Neutrophils Relative %: 85 % — ABNORMAL HIGH (ref 43–77)

## 2010-12-03 LAB — CBC
HCT: 22.9 % — ABNORMAL LOW (ref 36.0–46.0)
HCT: 26.5 % — ABNORMAL LOW (ref 36.0–46.0)
HCT: 27.2 % — ABNORMAL LOW (ref 36.0–46.0)
HCT: 27.4 % — ABNORMAL LOW (ref 36.0–46.0)
HCT: 28.2 % — ABNORMAL LOW (ref 36.0–46.0)
HCT: 28.6 % — ABNORMAL LOW (ref 36.0–46.0)
Hemoglobin: 10.2 g/dL — ABNORMAL LOW (ref 12.0–15.0)
Hemoglobin: 7.5 g/dL — CL (ref 12.0–15.0)
Hemoglobin: 7.7 g/dL — CL (ref 12.0–15.0)
Hemoglobin: 8.2 g/dL — ABNORMAL LOW (ref 12.0–15.0)
Hemoglobin: 9.6 g/dL — ABNORMAL LOW (ref 12.0–15.0)
Hemoglobin: 9.7 g/dL — ABNORMAL LOW (ref 12.0–15.0)
Hemoglobin: 9.9 g/dL — ABNORMAL LOW (ref 12.0–15.0)
MCHC: 35.4 g/dL (ref 30.0–36.0)
MCHC: 35.8 g/dL (ref 30.0–36.0)
MCHC: 35.1 g/dL (ref 30.0–36.0)
MCHC: 35.5 g/dL (ref 30.0–36.0)
MCHC: 35.5 g/dL (ref 30.0–36.0)
MCHC: 35.5 g/dL (ref 30.0–36.0)
MCHC: 35.7 g/dL (ref 30.0–36.0)
MCHC: 36.1 g/dL — ABNORMAL HIGH (ref 30.0–36.0)
MCHC: 36.4 g/dL — ABNORMAL HIGH (ref 30.0–36.0)
MCHC: 36.5 g/dL — ABNORMAL HIGH (ref 30.0–36.0)
MCV: 90.5 fL (ref 78.0–100.0)
MCV: 91 fL (ref 78.0–100.0)
MCV: 91.2 fL (ref 78.0–100.0)
MCV: 91.4 fL (ref 78.0–100.0)
MCV: 91.7 fL (ref 78.0–100.0)
MCV: 91.8 fL (ref 78.0–100.0)
Platelets: 252 10*3/uL (ref 150–400)
Platelets: 291 10*3/uL (ref 150–400)
Platelets: 299 10*3/uL (ref 150–400)
Platelets: 252 10*3/uL (ref 150–400)
Platelets: 341 10*3/uL (ref 150–400)
Platelets: 374 10*3/uL (ref 150–400)
Platelets: 427 10*3/uL — ABNORMAL HIGH (ref 150–400)
RBC: 1.91 MIL/uL — ABNORMAL LOW (ref 3.87–5.11)
RBC: 2.29 MIL/uL — ABNORMAL LOW (ref 3.87–5.11)
RBC: 2.22 MIL/uL — ABNORMAL LOW (ref 3.87–5.11)
RBC: 2.34 MIL/uL — ABNORMAL LOW (ref 3.87–5.11)
RBC: 2.46 MIL/uL — ABNORMAL LOW (ref 3.87–5.11)
RBC: 2.5 MIL/uL — ABNORMAL LOW (ref 3.87–5.11)
RBC: 2.52 MIL/uL — ABNORMAL LOW (ref 3.87–5.11)
RBC: 2.9 MIL/uL — ABNORMAL LOW (ref 3.87–5.11)
RBC: 3.06 MIL/uL — ABNORMAL LOW (ref 3.87–5.11)
RBC: 3.08 MIL/uL — ABNORMAL LOW (ref 3.87–5.11)
RDW: 15.2 % (ref 11.5–15.5)
RDW: 15.4 % (ref 11.5–15.5)
RDW: 15.7 % — ABNORMAL HIGH (ref 11.5–15.5)
RDW: 15.9 % — ABNORMAL HIGH (ref 11.5–15.5)
RDW: 15.9 % — ABNORMAL HIGH (ref 11.5–15.5)
RDW: 16.1 % — ABNORMAL HIGH (ref 11.5–15.5)
RDW: 17 % — ABNORMAL HIGH (ref 11.5–15.5)
RDW: 17.3 % — ABNORMAL HIGH (ref 11.5–15.5)
WBC: 10 10*3/uL (ref 4.0–10.5)
WBC: 11.8 10*3/uL — ABNORMAL HIGH (ref 4.0–10.5)
WBC: 11.9 10*3/uL — ABNORMAL HIGH (ref 4.0–10.5)
WBC: 11.2 10*3/uL — ABNORMAL HIGH (ref 4.0–10.5)
WBC: 13 10*3/uL — ABNORMAL HIGH (ref 4.0–10.5)
WBC: 13.6 10*3/uL — ABNORMAL HIGH (ref 4.0–10.5)
WBC: 7.5 10*3/uL (ref 4.0–10.5)
WBC: 9 10*3/uL (ref 4.0–10.5)
WBC: 9 10*3/uL (ref 4.0–10.5)
WBC: 9.8 10*3/uL (ref 4.0–10.5)

## 2010-12-03 LAB — CROSSMATCH
ABO/RH(D): A POS
ABO/RH(D): A POS
Antibody Screen: NEGATIVE

## 2010-12-03 LAB — PROTIME-INR
INR: 1.4 (ref 0.00–1.49)
INR: 1.4 (ref 0.00–1.49)
INR: 1.7 — ABNORMAL HIGH (ref 0.00–1.49)
INR: 1.2 (ref 0.00–1.49)
INR: 1.2 (ref 0.00–1.49)
INR: 1.2 (ref 0.00–1.49)
INR: 1.3 (ref 0.00–1.49)
INR: 1.3 (ref 0.00–1.49)
INR: 1.5 (ref 0.00–1.49)
INR: 1.7 — ABNORMAL HIGH (ref 0.00–1.49)
INR: 1.9 — ABNORMAL HIGH (ref 0.00–1.49)
Prothrombin Time: 15.8 seconds — ABNORMAL HIGH (ref 11.6–15.2)
Prothrombin Time: 17.9 seconds — ABNORMAL HIGH (ref 11.6–15.2)
Prothrombin Time: 21.3 seconds — ABNORMAL HIGH (ref 11.6–15.2)
Prothrombin Time: 15.6 seconds — ABNORMAL HIGH (ref 11.6–15.2)
Prothrombin Time: 15.7 seconds — ABNORMAL HIGH (ref 11.6–15.2)
Prothrombin Time: 15.8 seconds — ABNORMAL HIGH (ref 11.6–15.2)
Prothrombin Time: 16 seconds — ABNORMAL HIGH (ref 11.6–15.2)
Prothrombin Time: 16.4 seconds — ABNORMAL HIGH (ref 11.6–15.2)
Prothrombin Time: 16.7 seconds — ABNORMAL HIGH (ref 11.6–15.2)
Prothrombin Time: 17.7 seconds — ABNORMAL HIGH (ref 11.6–15.2)
Prothrombin Time: 18.9 seconds — ABNORMAL HIGH (ref 11.6–15.2)
Prothrombin Time: 23.1 seconds — ABNORMAL HIGH (ref 11.6–15.2)

## 2010-12-03 LAB — COMPREHENSIVE METABOLIC PANEL
ALT: 23 U/L (ref 0–35)
AST: 23 U/L (ref 0–37)
Albumin: 1.9 g/dL — ABNORMAL LOW (ref 3.5–5.2)
CO2: 20 mEq/L (ref 19–32)
Calcium: 9.2 mg/dL (ref 8.4–10.5)
Chloride: 103 mEq/L (ref 96–112)
Creatinine, Ser: 2.77 mg/dL — ABNORMAL HIGH (ref 0.4–1.2)
GFR calc Af Amer: 23 mL/min — ABNORMAL LOW (ref >60)
GFR calc non Af Amer: 19 mL/min — ABNORMAL LOW (ref >60)
Sodium: 128 mEq/L — ABNORMAL LOW (ref 135–145)

## 2010-12-03 LAB — BASIC METABOLIC PANEL
BUN: 33 mg/dL — ABNORMAL HIGH (ref 6–23)
BUN: 33 mg/dL — ABNORMAL HIGH (ref 6–23)
BUN: 34 mg/dL — ABNORMAL HIGH (ref 6–23)
BUN: 31 mg/dL — ABNORMAL HIGH (ref 6–23)
BUN: 34 mg/dL — ABNORMAL HIGH (ref 6–23)
BUN: 37 mg/dL — ABNORMAL HIGH (ref 6–23)
CO2: 21 mEq/L (ref 19–32)
CO2: 19 mEq/L (ref 19–32)
CO2: 21 mEq/L (ref 19–32)
CO2: 22 mEq/L (ref 19–32)
CO2: 22 mEq/L (ref 19–32)
CO2: 24 mEq/L (ref 19–32)
CO2: 24 mEq/L (ref 19–32)
CO2: 25 mEq/L (ref 19–32)
CO2: 26 mEq/L (ref 19–32)
Calcium: 8.4 mg/dL (ref 8.4–10.5)
Calcium: 8.8 mg/dL (ref 8.4–10.5)
Calcium: 8.9 mg/dL (ref 8.4–10.5)
Calcium: 9.3 mg/dL (ref 8.4–10.5)
Calcium: 9.7 mg/dL (ref 8.4–10.5)
Calcium: 9.7 mg/dL (ref 8.4–10.5)
Calcium: 9.7 mg/dL (ref 8.4–10.5)
Calcium: 9.8 mg/dL (ref 8.4–10.5)
Calcium: 9.8 mg/dL (ref 8.4–10.5)
Calcium: 9.8 mg/dL (ref 8.4–10.5)
Calcium: 9.9 mg/dL (ref 8.4–10.5)
Chloride: 106 mEq/L (ref 96–112)
Chloride: 105 mEq/L (ref 96–112)
Chloride: 107 mEq/L (ref 96–112)
Chloride: 108 mEq/L (ref 96–112)
Chloride: 109 mEq/L (ref 96–112)
Chloride: 110 mEq/L (ref 96–112)
Chloride: 111 mEq/L (ref 96–112)
Creatinine, Ser: 2.32 mg/dL — ABNORMAL HIGH (ref 0.4–1.2)
Creatinine, Ser: 3.05 mg/dL — ABNORMAL HIGH (ref 0.4–1.2)
Creatinine, Ser: 2.51 mg/dL — ABNORMAL HIGH (ref 0.4–1.2)
Creatinine, Ser: 2.61 mg/dL — ABNORMAL HIGH (ref 0.4–1.2)
Creatinine, Ser: 2.83 mg/dL — ABNORMAL HIGH (ref 0.4–1.2)
Creatinine, Ser: 2.92 mg/dL — ABNORMAL HIGH (ref 0.4–1.2)
Creatinine, Ser: 3.2 mg/dL — ABNORMAL HIGH (ref 0.4–1.2)
Creatinine, Ser: 3.22 mg/dL — ABNORMAL HIGH (ref 0.4–1.2)
Creatinine, Ser: 3.38 mg/dL — ABNORMAL HIGH (ref 0.4–1.2)
GFR calc Af Amer: 28 mL/min — ABNORMAL LOW (ref >60)
GFR calc Af Amer: 18 mL/min — ABNORMAL LOW (ref >60)
GFR calc Af Amer: 18 mL/min — ABNORMAL LOW (ref >60)
GFR calc Af Amer: 19 mL/min — ABNORMAL LOW (ref >60)
GFR calc Af Amer: 20 mL/min — ABNORMAL LOW (ref >60)
GFR calc Af Amer: 22 mL/min — ABNORMAL LOW (ref >60)
GFR calc Af Amer: 22 mL/min — ABNORMAL LOW (ref >60)
GFR calc Af Amer: 26 mL/min — ABNORMAL LOW (ref >60)
GFR calc non Af Amer: 17 mL/min — ABNORMAL LOW (ref >60)
GFR calc non Af Amer: 23 mL/min — ABNORMAL LOW (ref >60)
GFR calc non Af Amer: 15 mL/min — ABNORMAL LOW (ref >60)
GFR calc non Af Amer: 16 mL/min — ABNORMAL LOW (ref >60)
GFR calc non Af Amer: 17 mL/min — ABNORMAL LOW (ref >60)
GFR calc non Af Amer: 17 mL/min — ABNORMAL LOW (ref >60)
GFR calc non Af Amer: 20 mL/min — ABNORMAL LOW (ref >60)
GFR calc non Af Amer: 20 mL/min — ABNORMAL LOW (ref >60)
Glucose, Bld: 88 mg/dL (ref 70–99)
Glucose, Bld: 146 mg/dL — ABNORMAL HIGH (ref 70–99)
Glucose, Bld: 164 mg/dL — ABNORMAL HIGH (ref 70–99)
Glucose, Bld: 167 mg/dL — ABNORMAL HIGH (ref 70–99)
Glucose, Bld: 169 mg/dL — ABNORMAL HIGH (ref 70–99)
Glucose, Bld: 186 mg/dL — ABNORMAL HIGH (ref 70–99)
Glucose, Bld: 226 mg/dL — ABNORMAL HIGH (ref 70–99)
Potassium: 3 mEq/L — ABNORMAL LOW (ref 3.5–5.1)
Potassium: 4.1 mEq/L (ref 3.5–5.1)
Potassium: 4.7 mEq/L (ref 3.5–5.1)
Potassium: 3.1 mEq/L — ABNORMAL LOW (ref 3.5–5.1)
Potassium: 3.4 mEq/L — ABNORMAL LOW (ref 3.5–5.1)
Potassium: 3.6 mEq/L (ref 3.5–5.1)
Potassium: 3.9 mEq/L (ref 3.5–5.1)
Potassium: 4.3 mEq/L (ref 3.5–5.1)
Potassium: 5.4 mEq/L — ABNORMAL HIGH (ref 3.5–5.1)
Sodium: 134 mEq/L — ABNORMAL LOW (ref 135–145)
Sodium: 136 mEq/L (ref 135–145)
Sodium: 137 mEq/L (ref 135–145)
Sodium: 139 mEq/L (ref 135–145)
Sodium: 139 mEq/L (ref 135–145)
Sodium: 140 mEq/L (ref 135–145)
Sodium: 140 mEq/L (ref 135–145)

## 2010-12-03 LAB — BLOOD GAS, ARTERIAL
Acid-base deficit: 4.4 mmol/L — ABNORMAL HIGH (ref 0.0–2.0)
Bicarbonate: 20 mEq/L (ref 20.0–24.0)
O2 Saturation: 90.5 %
Patient temperature: 37
TCO2: 19 mmol/L (ref 0–100)

## 2010-12-03 LAB — LUPUS ANTICOAGULANT PANEL
PTT Lupus Anticoagulant: 82.9 secs — ABNORMAL HIGH (ref 36.3–48.8)
PTTLA Confirmation: 19.8 secs — ABNORMAL HIGH (ref <8.0)

## 2010-12-03 LAB — HEMOGLOBIN AND HEMATOCRIT, BLOOD
HCT: 25.3 % — ABNORMAL LOW (ref 36.0–46.0)
HCT: 28.9 % — ABNORMAL LOW (ref 36.0–46.0)
Hemoglobin: 10.2 g/dL — ABNORMAL LOW (ref 12.0–15.0)
Hemoglobin: 9.3 g/dL — ABNORMAL LOW (ref 12.0–15.0)

## 2010-12-03 LAB — BETA-2-GLYCOPROTEIN I ABS, IGG/M/A
Beta-2 Glyco I IgG: <10 U/mL (ref <20)
Beta-2-Glycoprotein I IgA: <10 U/mL (ref <20)
Beta-2-Glycoprotein I IgM: <10 U/mL (ref <20)

## 2010-12-03 LAB — WOUND CULTURE

## 2010-12-03 LAB — CARDIOLIPIN ANTIBODIES, IGG, IGM, IGA: Anticardiolipin IgM: 21 [MPL'U] (ref <13)

## 2010-12-03 LAB — MAGNESIUM
Magnesium: 1.9 mg/dL (ref 1.5–2.5)
Magnesium: 1.9 mg/dL (ref 1.5–2.5)

## 2010-12-03 LAB — APTT: aPTT: 65 seconds — ABNORMAL HIGH (ref 24–37)

## 2010-12-03 LAB — PROTEIN S, TOTAL: Protein S Ag, Total: 123 % (ref 70–140)

## 2010-12-03 LAB — HOMOCYSTEINE: Homocysteine: 10.9 umol/L (ref 4.0–15.4)

## 2010-12-03 LAB — PATHOLOGIST SMEAR REVIEW

## 2010-12-03 LAB — GLUCOSE, RANDOM: Glucose, Bld: 474 mg/dL — ABNORMAL HIGH (ref 70–99)

## 2010-12-03 LAB — PROTEIN C ACTIVITY: Protein C Activity: 123 % (ref 75–133)

## 2010-12-03 LAB — PROTHROMBIN GENE MUTATION

## 2010-12-03 LAB — VANCOMYCIN, RANDOM: Vancomycin Rm: 20.4 ug/mL

## 2010-12-03 LAB — PROTEIN C, TOTAL: Protein C, Total: 147 % — ABNORMAL HIGH (ref 70–140)

## 2010-12-04 LAB — LIPASE, BLOOD: Lipase: 16 U/L (ref 11–59)

## 2010-12-04 LAB — GLUCOSE, CAPILLARY
Glucose-Capillary: 140 mg/dL — ABNORMAL HIGH (ref 70–99)
Glucose-Capillary: 141 mg/dL — ABNORMAL HIGH (ref 70–99)
Glucose-Capillary: 194 mg/dL — ABNORMAL HIGH (ref 70–99)
Glucose-Capillary: 204 mg/dL — ABNORMAL HIGH (ref 70–99)
Glucose-Capillary: 209 mg/dL — ABNORMAL HIGH (ref 70–99)
Glucose-Capillary: 215 mg/dL — ABNORMAL HIGH (ref 70–99)
Glucose-Capillary: 217 mg/dL — ABNORMAL HIGH (ref 70–99)
Glucose-Capillary: 273 mg/dL — ABNORMAL HIGH (ref 70–99)
Glucose-Capillary: 284 mg/dL — ABNORMAL HIGH (ref 70–99)
Glucose-Capillary: 286 mg/dL — ABNORMAL HIGH (ref 70–99)
Glucose-Capillary: 302 mg/dL — ABNORMAL HIGH (ref 70–99)
Glucose-Capillary: 302 mg/dL — ABNORMAL HIGH (ref 70–99)
Glucose-Capillary: 312 mg/dL — ABNORMAL HIGH (ref 70–99)
Glucose-Capillary: 341 mg/dL — ABNORMAL HIGH (ref 70–99)

## 2010-12-04 LAB — BASIC METABOLIC PANEL
BUN: 27 mg/dL — ABNORMAL HIGH (ref 6–23)
BUN: 28 mg/dL — ABNORMAL HIGH (ref 6–23)
CO2: 18 mEq/L — ABNORMAL LOW (ref 19–32)
CO2: 18 mEq/L — ABNORMAL LOW (ref 19–32)
CO2: 21 mEq/L (ref 19–32)
CO2: 23 mEq/L (ref 19–32)
Calcium: 8.7 mg/dL (ref 8.4–10.5)
Calcium: 9.1 mg/dL (ref 8.4–10.5)
Chloride: 110 mEq/L (ref 96–112)
Chloride: 113 mEq/L — ABNORMAL HIGH (ref 96–112)
Creatinine, Ser: 2.33 mg/dL — ABNORMAL HIGH (ref 0.4–1.2)
Creatinine, Ser: 2.63 mg/dL — ABNORMAL HIGH (ref 0.4–1.2)
GFR calc Af Amer: 24 mL/min — ABNORMAL LOW (ref >60)
GFR calc Af Amer: 28 mL/min — ABNORMAL LOW (ref >60)
Glucose, Bld: 152 mg/dL — ABNORMAL HIGH (ref 70–99)
Glucose, Bld: 191 mg/dL — ABNORMAL HIGH (ref 70–99)
Glucose, Bld: 263 mg/dL — ABNORMAL HIGH (ref 70–99)
Potassium: 3.5 mEq/L (ref 3.5–5.1)
Potassium: 3.9 mEq/L (ref 3.5–5.1)
Sodium: 139 mEq/L (ref 135–145)

## 2010-12-04 LAB — CBC
HCT: 31.4 % — ABNORMAL LOW (ref 36.0–46.0)
MCHC: 35.2 g/dL (ref 30.0–36.0)
MCHC: 35.9 g/dL (ref 30.0–36.0)
MCHC: 36.2 g/dL — ABNORMAL HIGH (ref 30.0–36.0)
Platelets: 298 10*3/uL (ref 150–400)
RBC: 2.59 MIL/uL — ABNORMAL LOW (ref 3.87–5.11)
RBC: 2.87 MIL/uL — ABNORMAL LOW (ref 3.87–5.11)
RDW: 16.4 % — ABNORMAL HIGH (ref 11.5–15.5)
RDW: 16.8 % — ABNORMAL HIGH (ref 11.5–15.5)

## 2010-12-04 LAB — DIFFERENTIAL
Basophils Absolute: 0 10*3/uL (ref 0.0–0.1)
Basophils Absolute: 0.1 10*3/uL (ref 0.0–0.1)
Basophils Relative: 0 % (ref 0–1)
Basophils Relative: 0 % (ref 0–1)
Basophils Relative: 1 % (ref 0–1)
Lymphocytes Relative: 12 % (ref 12–46)
Lymphs Abs: 1.4 10*3/uL (ref 0.7–4.0)
Monocytes Absolute: 0.8 10*3/uL (ref 0.1–1.0)
Monocytes Relative: 6 % (ref 3–12)
Monocytes Relative: 7 % (ref 3–12)
Neutro Abs: 12.1 10*3/uL — ABNORMAL HIGH (ref 1.7–7.7)
Neutro Abs: 6.5 10*3/uL (ref 1.7–7.7)
Neutro Abs: 9.2 10*3/uL — ABNORMAL HIGH (ref 1.7–7.7)
Neutrophils Relative %: 78 % — ABNORMAL HIGH (ref 43–77)
Neutrophils Relative %: 81 % — ABNORMAL HIGH (ref 43–77)
Neutrophils Relative %: 87 % — ABNORMAL HIGH (ref 43–77)

## 2010-12-04 LAB — MAGNESIUM: Magnesium: 1.9 mg/dL (ref 1.5–2.5)

## 2010-12-04 LAB — PROTIME-INR
INR: 1.1 (ref 0.00–1.49)
INR: 1.2 (ref 0.00–1.49)
Prothrombin Time: 13.8 seconds (ref 11.6–15.2)
Prothrombin Time: 14.5 seconds (ref 11.6–15.2)
Prothrombin Time: 15.7 seconds — ABNORMAL HIGH (ref 11.6–15.2)

## 2010-12-04 LAB — CULTURE, BLOOD (ROUTINE X 2)
Culture: NO GROWTH
Report Status: 7052010

## 2010-12-04 LAB — BRAIN NATRIURETIC PEPTIDE: Pro B Natriuretic peptide (BNP): 509 pg/mL — ABNORMAL HIGH (ref 0.0–100.0)

## 2010-12-04 LAB — IRON AND TIBC

## 2010-12-04 LAB — COMPREHENSIVE METABOLIC PANEL
Albumin: 2.4 g/dL — ABNORMAL LOW (ref 3.5–5.2)
Alkaline Phosphatase: 141 U/L — ABNORMAL HIGH (ref 39–117)
BUN: 30 mg/dL — ABNORMAL HIGH (ref 6–23)
Calcium: 10.4 mg/dL (ref 8.4–10.5)
Glucose, Bld: 292 mg/dL — ABNORMAL HIGH (ref 70–99)
Potassium: 3.9 mEq/L (ref 3.5–5.1)
Total Protein: 5.8 g/dL — ABNORMAL LOW (ref 6.0–8.3)

## 2010-12-04 LAB — POCT CARDIAC MARKERS
CKMB, poc: 3 ng/mL (ref 1.0–8.0)
Myoglobin, poc: 245 ng/mL (ref 12–200)
Troponin i, poc: <0.05 ng/mL (ref 0.00–0.09)

## 2010-12-04 LAB — RETICULOCYTES
Retic Count, Absolute: 72.8 10*3/uL (ref 19.0–186.0)
Retic Ct Pct: 2.8 % (ref 0.4–3.1)

## 2010-12-04 LAB — FOLATE: Folate: 4.9 ng/mL

## 2010-12-05 LAB — CBC
Hemoglobin: 11.3 g/dL — ABNORMAL LOW (ref 12.0–15.0)
MCHC: 35.7 g/dL (ref 30.0–36.0)
MCV: 89.3 fL (ref 78.0–100.0)
RBC: 3.54 MIL/uL — ABNORMAL LOW (ref 3.87–5.11)
RDW: 17.8 % — ABNORMAL HIGH (ref 11.5–15.5)

## 2010-12-05 LAB — COMPREHENSIVE METABOLIC PANEL
BUN: 31 mg/dL — ABNORMAL HIGH (ref 6–23)
CO2: 21 mEq/L (ref 19–32)
Calcium: 9.6 mg/dL (ref 8.4–10.5)
Calcium: 9.7 mg/dL (ref 8.4–10.5)
Creatinine, Ser: 2.56 mg/dL — ABNORMAL HIGH (ref 0.4–1.2)
Creatinine, Ser: 2.42 mg/dL — ABNORMAL HIGH (ref 0.4–1.2)
GFR calc non Af Amer: 21 mL/min — ABNORMAL LOW (ref >60)
GFR calc non Af Amer: 22 mL/min — ABNORMAL LOW (ref >60)
Glucose, Bld: 231 mg/dL — ABNORMAL HIGH (ref 70–99)
Glucose, Bld: 70 mg/dL (ref 70–99)
Total Protein: 5.7 g/dL — ABNORMAL LOW (ref 6.0–8.3)

## 2010-12-05 LAB — URINALYSIS, ROUTINE W REFLEX MICROSCOPIC
Bilirubin Urine: NEGATIVE
Ketones, ur: NEGATIVE mg/dL
Leukocytes, UA: NEGATIVE
Nitrite: NEGATIVE
Specific Gravity, Urine: 1.02 (ref 1.005–1.030)
Urobilinogen, UA: 0.2 mg/dL (ref 0.0–1.0)

## 2010-12-05 LAB — HEMOGLOBIN AND HEMATOCRIT, BLOOD
HCT: 28.8 % — ABNORMAL LOW (ref 36.0–46.0)
Hemoglobin: 10.2 g/dL — ABNORMAL LOW (ref 12.0–15.0)

## 2010-12-05 LAB — URINE MICROSCOPIC-ADD ON

## 2010-12-05 LAB — PROTEIN / CREATININE RATIO, URINE
Protein Creatinine Ratio: 8.24 — ABNORMAL HIGH (ref 0.00–0.15)
Total Protein, Urine: 380 mg/dL

## 2010-12-05 LAB — DIFFERENTIAL
Eosinophils Absolute: 0.2 10*3/uL (ref 0.0–0.7)
Lymphocytes Relative: 14 % (ref 12–46)
Lymphs Abs: 1.2 10*3/uL (ref 0.7–4.0)
Neutro Abs: 7 10*3/uL (ref 1.7–7.7)
Neutrophils Relative %: 78 % — ABNORMAL HIGH (ref 43–77)

## 2010-12-05 LAB — PROTEIN, URINE, RANDOM: Total Protein, Urine: 357 mg/dL

## 2010-12-05 LAB — LIPASE, BLOOD: Lipase: 11 U/L (ref 11–59)

## 2010-12-06 LAB — BASIC METABOLIC PANEL
Calcium: 10 mg/dL (ref 8.4–10.5)
Chloride: 104 mEq/L (ref 96–112)
GFR calc Af Amer: 29 mL/min — ABNORMAL LOW (ref >60)
GFR calc non Af Amer: 19 mL/min — ABNORMAL LOW (ref >60)
Glucose, Bld: 220 mg/dL — ABNORMAL HIGH (ref 70–99)
Potassium: 4.5 mEq/L (ref 3.5–5.1)
Sodium: 136 mEq/L (ref 135–145)
Sodium: 136 mEq/L (ref 135–145)

## 2010-12-06 LAB — CBC
Hemoglobin: 11.2 g/dL — ABNORMAL LOW (ref 12.0–15.0)
Platelets: 276 10*3/uL (ref 150–400)
RDW: 18 % — ABNORMAL HIGH (ref 11.5–15.5)

## 2010-12-06 LAB — HEMOGLOBIN A1C
Hgb A1c MFr Bld: 8.9 % — ABNORMAL HIGH (ref 4.6–6.1)
Mean Plasma Glucose: 209 mg/dL

## 2010-12-06 LAB — RENAL FUNCTION PANEL
Albumin: 2.2 g/dL — ABNORMAL LOW (ref 3.5–5.2)
Chloride: 106 mEq/L (ref 96–112)
Creatinine, Ser: 2.17 mg/dL — ABNORMAL HIGH (ref 0.4–1.2)
GFR calc Af Amer: 31 mL/min — ABNORMAL LOW (ref >60)
GFR calc non Af Amer: 25 mL/min — ABNORMAL LOW (ref >60)
Potassium: 4.5 mEq/L (ref 3.5–5.1)
Sodium: 136 mEq/L (ref 135–145)

## 2010-12-06 LAB — DIFFERENTIAL
Basophils Absolute: 0 10*3/uL (ref 0.0–0.1)
Lymphocytes Relative: 12 % (ref 12–46)
Monocytes Absolute: 0.6 10*3/uL (ref 0.1–1.0)
Neutro Abs: 7.2 10*3/uL (ref 1.7–7.7)

## 2010-12-07 ENCOUNTER — Emergency Department (HOSPITAL_COMMUNITY)
Admission: EM | Admit: 2010-12-07 | Discharge: 2010-12-07 | Disposition: A | Discharge status: Home / Self Care | Payer: Medicare Other | Attending: Emergency Medicine | Admitting: Emergency Medicine

## 2010-12-07 ENCOUNTER — Encounter (HOSPITAL_COMMUNITY): Payer: Medicare Other

## 2010-12-07 DIAGNOSIS — E669 Obesity, unspecified: Secondary | ICD-10-CM | POA: Insufficient documentation

## 2010-12-07 DIAGNOSIS — R5381 Other malaise: Secondary | ICD-10-CM | POA: Insufficient documentation

## 2010-12-07 DIAGNOSIS — J4489 Other specified chronic obstructive pulmonary disease: Secondary | ICD-10-CM | POA: Insufficient documentation

## 2010-12-07 DIAGNOSIS — K219 Gastro-esophageal reflux disease without esophagitis: Secondary | ICD-10-CM | POA: Insufficient documentation

## 2010-12-07 DIAGNOSIS — I10 Essential (primary) hypertension: Secondary | ICD-10-CM | POA: Insufficient documentation

## 2010-12-07 DIAGNOSIS — J449 Chronic obstructive pulmonary disease, unspecified: Secondary | ICD-10-CM | POA: Insufficient documentation

## 2010-12-07 DIAGNOSIS — K3184 Gastroparesis: Secondary | ICD-10-CM | POA: Insufficient documentation

## 2010-12-07 DIAGNOSIS — I252 Old myocardial infarction: Secondary | ICD-10-CM | POA: Insufficient documentation

## 2010-12-07 DIAGNOSIS — Z794 Long term (current) use of insulin: Secondary | ICD-10-CM | POA: Insufficient documentation

## 2010-12-07 DIAGNOSIS — I2510 Atherosclerotic heart disease of native coronary artery without angina pectoris: Secondary | ICD-10-CM | POA: Insufficient documentation

## 2010-12-07 DIAGNOSIS — E1169 Type 2 diabetes mellitus with other specified complication: Secondary | ICD-10-CM | POA: Insufficient documentation

## 2010-12-07 DIAGNOSIS — R112 Nausea with vomiting, unspecified: Secondary | ICD-10-CM | POA: Insufficient documentation

## 2010-12-07 DIAGNOSIS — R109 Unspecified abdominal pain: Secondary | ICD-10-CM | POA: Insufficient documentation

## 2010-12-07 LAB — BASIC METABOLIC PANEL
BUN: 22 mg/dL (ref 6–23)
BUN: 23 mg/dL (ref 6–23)
CO2: 27 mEq/L (ref 19–32)
Calcium: 9 mg/dL (ref 8.4–10.5)
Creatinine, Ser: 2.19 mg/dL — ABNORMAL HIGH (ref 0.4–1.2)
Creatinine, Ser: 2.54 mg/dL — ABNORMAL HIGH (ref 0.4–1.2)
GFR calc non Af Amer: 21 mL/min — ABNORMAL LOW (ref >60)
GFR calc non Af Amer: 25 mL/min — ABNORMAL LOW (ref >60)
Glucose, Bld: 156 mg/dL — ABNORMAL HIGH (ref 70–99)
Sodium: 139 mEq/L (ref 135–145)

## 2010-12-07 LAB — CBC
HCT: 34.1 % — ABNORMAL LOW (ref 36.0–46.0)
Hemoglobin: 11.5 g/dL — ABNORMAL LOW (ref 12.0–15.0)
MCHC: 33.7 g/dL (ref 30.0–36.0)
MCHC: 33.7 g/dL (ref 30.0–36.0)
Platelets: 324 10*3/uL (ref 150–400)
Platelets: 360 10*3/uL (ref 150–400)
RBC: 3.33 MIL/uL — ABNORMAL LOW (ref 3.87–5.11)
RDW: 17.1 % — ABNORMAL HIGH (ref 11.5–15.5)
RDW: 17.2 % — ABNORMAL HIGH (ref 11.5–15.5)
WBC: 8.4 10*3/uL (ref 4.0–10.5)

## 2010-12-07 LAB — DIFFERENTIAL
Basophils Absolute: 0 10*3/uL (ref 0.0–0.1)
Basophils Absolute: 0 10*3/uL (ref 0.0–0.1)
Basophils Relative: 2 % — ABNORMAL HIGH (ref 0–1)
Eosinophils Absolute: 0 10*3/uL (ref 0.0–0.7)
Eosinophils Absolute: 0.2 10*3/uL (ref 0.0–0.7)
Eosinophils Relative: 3 % (ref 0–5)
Lymphocytes Relative: 9 % — ABNORMAL LOW (ref 12–46)
Lymphocytes Relative: 15 % (ref 12–46)
Lymphs Abs: 1.3 10*3/uL (ref 0.7–4.0)
Monocytes Absolute: 0.4 10*3/uL (ref 0.1–1.0)
Monocytes Absolute: 0.6 10*3/uL (ref 0.1–1.0)
Monocytes Relative: 4 % (ref 3–12)
Monocytes Relative: 9 % (ref 3–12)
Neutro Abs: 8.8 10*3/uL — ABNORMAL HIGH (ref 1.7–7.7)
Neutrophils Relative %: 67 % (ref 43–77)
Neutrophils Relative %: 75 % (ref 43–77)

## 2010-12-07 LAB — COMPREHENSIVE METABOLIC PANEL
ALT: 20 U/L (ref 0–35)
Alkaline Phosphatase: 151 U/L — ABNORMAL HIGH (ref 39–117)
CO2: 22 mEq/L (ref 19–32)
Calcium: 10 mg/dL (ref 8.4–10.5)
Chloride: 97 mEq/L (ref 96–112)
GFR calc non Af Amer: 9 mL/min — ABNORMAL LOW (ref >60)
Glucose, Bld: 329 mg/dL — ABNORMAL HIGH (ref 70–99)
Sodium: 135 mEq/L (ref 135–145)
Total Bilirubin: 0.6 mg/dL (ref 0.3–1.2)

## 2010-12-07 LAB — HEMOGLOBIN AND HEMATOCRIT, BLOOD
HCT: 34.1 % — ABNORMAL LOW (ref 36.0–46.0)
Hemoglobin: 11.9 g/dL — ABNORMAL LOW (ref 12.0–15.0)

## 2010-12-07 LAB — RENAL FUNCTION PANEL
Albumin: 2.1 g/dL — ABNORMAL LOW (ref 3.5–5.2)
BUN: 35 mg/dL — ABNORMAL HIGH (ref 6–23)
Creatinine, Ser: 2.34 mg/dL — ABNORMAL HIGH (ref 0.4–1.2)
Phosphorus: 5.8 mg/dL — ABNORMAL HIGH (ref 2.3–4.6)

## 2010-12-07 LAB — GLUCOSE, CAPILLARY
Glucose-Capillary: 128 mg/dL — ABNORMAL HIGH (ref 70–99)
Glucose-Capillary: 166 mg/dL — ABNORMAL HIGH (ref 70–99)
Glucose-Capillary: 97 mg/dL (ref 70–99)

## 2010-12-07 LAB — POCT CARDIAC MARKERS: CKMB, poc: 1.7 ng/mL (ref 1.0–8.0)

## 2010-12-07 LAB — LIPASE, BLOOD: Lipase: 19 U/L (ref 11–59)

## 2010-12-07 LAB — MAGNESIUM: Magnesium: 2.2 mg/dL (ref 1.5–2.5)

## 2010-12-12 LAB — DIFFERENTIAL
Basophils Absolute: 0.1 10*3/uL (ref 0.0–0.1)
Basophils Relative: 1 % (ref 0–1)
Basophils Relative: 1 % (ref 0–1)
Eosinophils Absolute: 0.1 10*3/uL (ref 0.0–0.7)
Eosinophils Absolute: 0.2 10*3/uL (ref 0.0–0.7)
Eosinophils Absolute: 0.2 10*3/uL (ref 0.0–0.7)
Eosinophils Relative: 2 % (ref 0–5)
Lymphocytes Relative: 13 % (ref 12–46)
Lymphocytes Relative: 13 % (ref 12–46)
Lymphocytes Relative: 20 % (ref 12–46)
Lymphs Abs: 1 10*3/uL (ref 0.7–4.0)
Lymphs Abs: 1.1 10*3/uL (ref 0.7–4.0)
Lymphs Abs: 1.4 10*3/uL (ref 0.7–4.0)
Lymphs Abs: 1.6 10*3/uL (ref 0.7–4.0)
Monocytes Absolute: 0.7 10*3/uL (ref 0.1–1.0)
Monocytes Absolute: 0.7 10*3/uL (ref 0.1–1.0)
Monocytes Relative: 5 % (ref 3–12)
Monocytes Relative: 9 % (ref 3–12)
Neutro Abs: 6.3 10*3/uL (ref 1.7–7.7)
Neutro Abs: 8.5 10*3/uL — ABNORMAL HIGH (ref 1.7–7.7)
Neutrophils Relative %: 67 % (ref 43–77)
Neutrophils Relative %: 71 % (ref 43–77)
Neutrophils Relative %: 79 % — ABNORMAL HIGH (ref 43–77)
Neutrophils Relative %: 80 % — ABNORMAL HIGH (ref 43–77)

## 2010-12-12 LAB — BASIC METABOLIC PANEL
BUN: 16 mg/dL (ref 6–23)
BUN: 17 mg/dL (ref 6–23)
BUN: 20 mg/dL (ref 6–23)
BUN: 21 mg/dL (ref 6–23)
CO2: 27 mEq/L (ref 19–32)
CO2: 27 mEq/L (ref 19–32)
CO2: 27 mEq/L (ref 19–32)
Calcium: 7.9 mg/dL — ABNORMAL LOW (ref 8.4–10.5)
Calcium: 8.3 mg/dL — ABNORMAL LOW (ref 8.4–10.5)
Calcium: 9.1 mg/dL (ref 8.4–10.5)
Chloride: 106 mEq/L (ref 96–112)
Chloride: 107 mEq/L (ref 96–112)
Chloride: 109 mEq/L (ref 96–112)
Creatinine, Ser: 2.01 mg/dL — ABNORMAL HIGH (ref 0.4–1.2)
Creatinine, Ser: 2.14 mg/dL — ABNORMAL HIGH (ref 0.4–1.2)
Creatinine, Ser: 2.34 mg/dL — ABNORMAL HIGH (ref 0.4–1.2)
GFR calc Af Amer: 25 mL/min — ABNORMAL LOW (ref >60)
GFR calc Af Amer: 31 mL/min — ABNORMAL LOW (ref >60)
GFR calc Af Amer: 38 mL/min — ABNORMAL LOW (ref >60)
GFR calc non Af Amer: 21 mL/min — ABNORMAL LOW (ref >60)
GFR calc non Af Amer: 23 mL/min — ABNORMAL LOW (ref >60)
GFR calc non Af Amer: 25 mL/min — ABNORMAL LOW (ref >60)
GFR calc non Af Amer: 28 mL/min — ABNORMAL LOW (ref >60)
GFR calc non Af Amer: 32 mL/min — ABNORMAL LOW (ref >60)
Glucose, Bld: 118 mg/dL — ABNORMAL HIGH (ref 70–99)
Glucose, Bld: 139 mg/dL — ABNORMAL HIGH (ref 70–99)
Glucose, Bld: 78 mg/dL (ref 70–99)
Potassium: 3.3 mEq/L — ABNORMAL LOW (ref 3.5–5.1)
Potassium: 3.4 mEq/L — ABNORMAL LOW (ref 3.5–5.1)
Potassium: 3.9 mEq/L (ref 3.5–5.1)
Potassium: 4 mEq/L (ref 3.5–5.1)
Potassium: 4.6 mEq/L (ref 3.5–5.1)
Sodium: 136 mEq/L (ref 135–145)
Sodium: 137 mEq/L (ref 135–145)
Sodium: 139 mEq/L (ref 135–145)
Sodium: 139 mEq/L (ref 135–145)

## 2010-12-12 LAB — PROTEIN, URINE, 24 HOUR
Collection Interval-UPROT: 24 hours
Protein, 24H Urine: 19350 mg/d — ABNORMAL HIGH (ref 50–100)
Protein, 24H Urine: 6000 mg/d — ABNORMAL HIGH (ref 50–100)
Urine Total Volume-UPROT: 1500 mL

## 2010-12-12 LAB — COMPREHENSIVE METABOLIC PANEL
ALT: 12 U/L (ref 0–35)
BUN: 19 mg/dL (ref 6–23)
CO2: 27 mEq/L (ref 19–32)
Calcium: 9.5 mg/dL (ref 8.4–10.5)
Creatinine, Ser: 1.91 mg/dL — ABNORMAL HIGH (ref 0.4–1.2)
GFR calc non Af Amer: 29 mL/min — ABNORMAL LOW (ref >60)
Glucose, Bld: 334 mg/dL — ABNORMAL HIGH (ref 70–99)
Sodium: 138 mEq/L (ref 135–145)
Total Protein: 5.2 g/dL — ABNORMAL LOW (ref 6.0–8.3)

## 2010-12-12 LAB — GLUCOSE, CAPILLARY
Glucose-Capillary: 107 mg/dL — ABNORMAL HIGH (ref 70–99)
Glucose-Capillary: 111 mg/dL — ABNORMAL HIGH (ref 70–99)
Glucose-Capillary: 116 mg/dL — ABNORMAL HIGH (ref 70–99)
Glucose-Capillary: 121 mg/dL — ABNORMAL HIGH (ref 70–99)
Glucose-Capillary: 128 mg/dL — ABNORMAL HIGH (ref 70–99)
Glucose-Capillary: 128 mg/dL — ABNORMAL HIGH (ref 70–99)
Glucose-Capillary: 131 mg/dL — ABNORMAL HIGH (ref 70–99)
Glucose-Capillary: 145 mg/dL — ABNORMAL HIGH (ref 70–99)
Glucose-Capillary: 153 mg/dL — ABNORMAL HIGH (ref 70–99)
Glucose-Capillary: 177 mg/dL — ABNORMAL HIGH (ref 70–99)
Glucose-Capillary: 180 mg/dL — ABNORMAL HIGH (ref 70–99)
Glucose-Capillary: 219 mg/dL — ABNORMAL HIGH (ref 70–99)
Glucose-Capillary: 223 mg/dL — ABNORMAL HIGH (ref 70–99)
Glucose-Capillary: 261 mg/dL — ABNORMAL HIGH (ref 70–99)
Glucose-Capillary: 81 mg/dL (ref 70–99)
Glucose-Capillary: 90 mg/dL (ref 70–99)
Glucose-Capillary: 93 mg/dL (ref 70–99)
Glucose-Capillary: 94 mg/dL (ref 70–99)
Glucose-Capillary: 99 mg/dL (ref 70–99)

## 2010-12-12 LAB — CBC
HCT: 28.9 % — ABNORMAL LOW (ref 36.0–46.0)
HCT: 30.5 % — ABNORMAL LOW (ref 36.0–46.0)
Hemoglobin: 10.3 g/dL — ABNORMAL LOW (ref 12.0–15.0)
Hemoglobin: 11.8 g/dL — ABNORMAL LOW (ref 12.0–15.0)
Hemoglobin: 9.6 g/dL — ABNORMAL LOW (ref 12.0–15.0)
MCHC: 33.5 g/dL (ref 30.0–36.0)
MCHC: 33.8 g/dL (ref 30.0–36.0)
MCHC: 34.1 g/dL (ref 30.0–36.0)
MCV: 86.2 fL (ref 78.0–100.0)
MCV: 86.6 fL (ref 78.0–100.0)
MCV: 86.8 fL (ref 78.0–100.0)
Platelets: 330 10*3/uL (ref 150–400)
Platelets: 388 10*3/uL (ref 150–400)
RBC: 3.52 MIL/uL — ABNORMAL LOW (ref 3.87–5.11)
RDW: 16.6 % — ABNORMAL HIGH (ref 11.5–15.5)
RDW: 17.1 % — ABNORMAL HIGH (ref 11.5–15.5)
WBC: 7.8 10*3/uL (ref 4.0–10.5)
WBC: 8 10*3/uL (ref 4.0–10.5)

## 2010-12-12 LAB — UIFE/LIGHT CHAINS/TP QN, 24-HR UR
Alpha 2, Urine: DETECTED — AB
Beta, Urine: DETECTED — AB
Free Kappa Lt Chains,Ur: 15.8 mg/dL — ABNORMAL HIGH (ref 0.04–1.51)
Free Lambda Lt Chains,Ur: 4.21 mg/dL — ABNORMAL HIGH (ref 0.08–1.01)
Free Lt Chn Excr Rate: 679.4 mg/d
Total Protein, Urine-Ur/day: 12255 mg/d — ABNORMAL HIGH (ref 10–140)
Total Protein, Urine: 285 mg/dL

## 2010-12-12 LAB — CREATININE CLEARANCE, URINE, 24 HOUR
Collection Interval-CRCL: 24 hours
Creatinine Clearance: 35 mL/min — ABNORMAL LOW (ref 75–115)
Creatinine, 24H Ur: 887 mg/d (ref 700–1800)

## 2010-12-12 LAB — BRAIN NATRIURETIC PEPTIDE: Pro B Natriuretic peptide (BNP): 481 pg/mL — ABNORMAL HIGH (ref 0.0–100.0)

## 2010-12-12 LAB — C4 COMPLEMENT: Complement C4, Body Fluid: 39 mg/dL (ref 16–47)

## 2010-12-12 LAB — PTH, INTACT AND CALCIUM
Calcium, Total (PTH): 8.1 mg/dL — ABNORMAL LOW (ref 8.4–10.5)
PTH: 180.7 pg/mL — ABNORMAL HIGH (ref 14.0–72.0)

## 2010-12-12 LAB — HEPATITIS C ANTIBODY: HCV Ab: NEGATIVE

## 2010-12-12 LAB — COMPLEMENT, TOTAL

## 2010-12-12 LAB — HEPATITIS B SURFACE ANTIGEN: Hepatitis B Surface Ag: NEGATIVE

## 2010-12-12 LAB — C3 COMPLEMENT: C3 Complement: 178 mg/dL (ref 88–201)

## 2010-12-27 IMAGING — CR DG CHEST 2V
2 series · 2 of 2 positions shown · non-contrast
Comparison: 07/04/2009

CLINICAL DATA: End-stage renal disease.  Hypertension.  Diabetes.

CHEST - 2 VIEW

[view not recorded (1 of 2)]
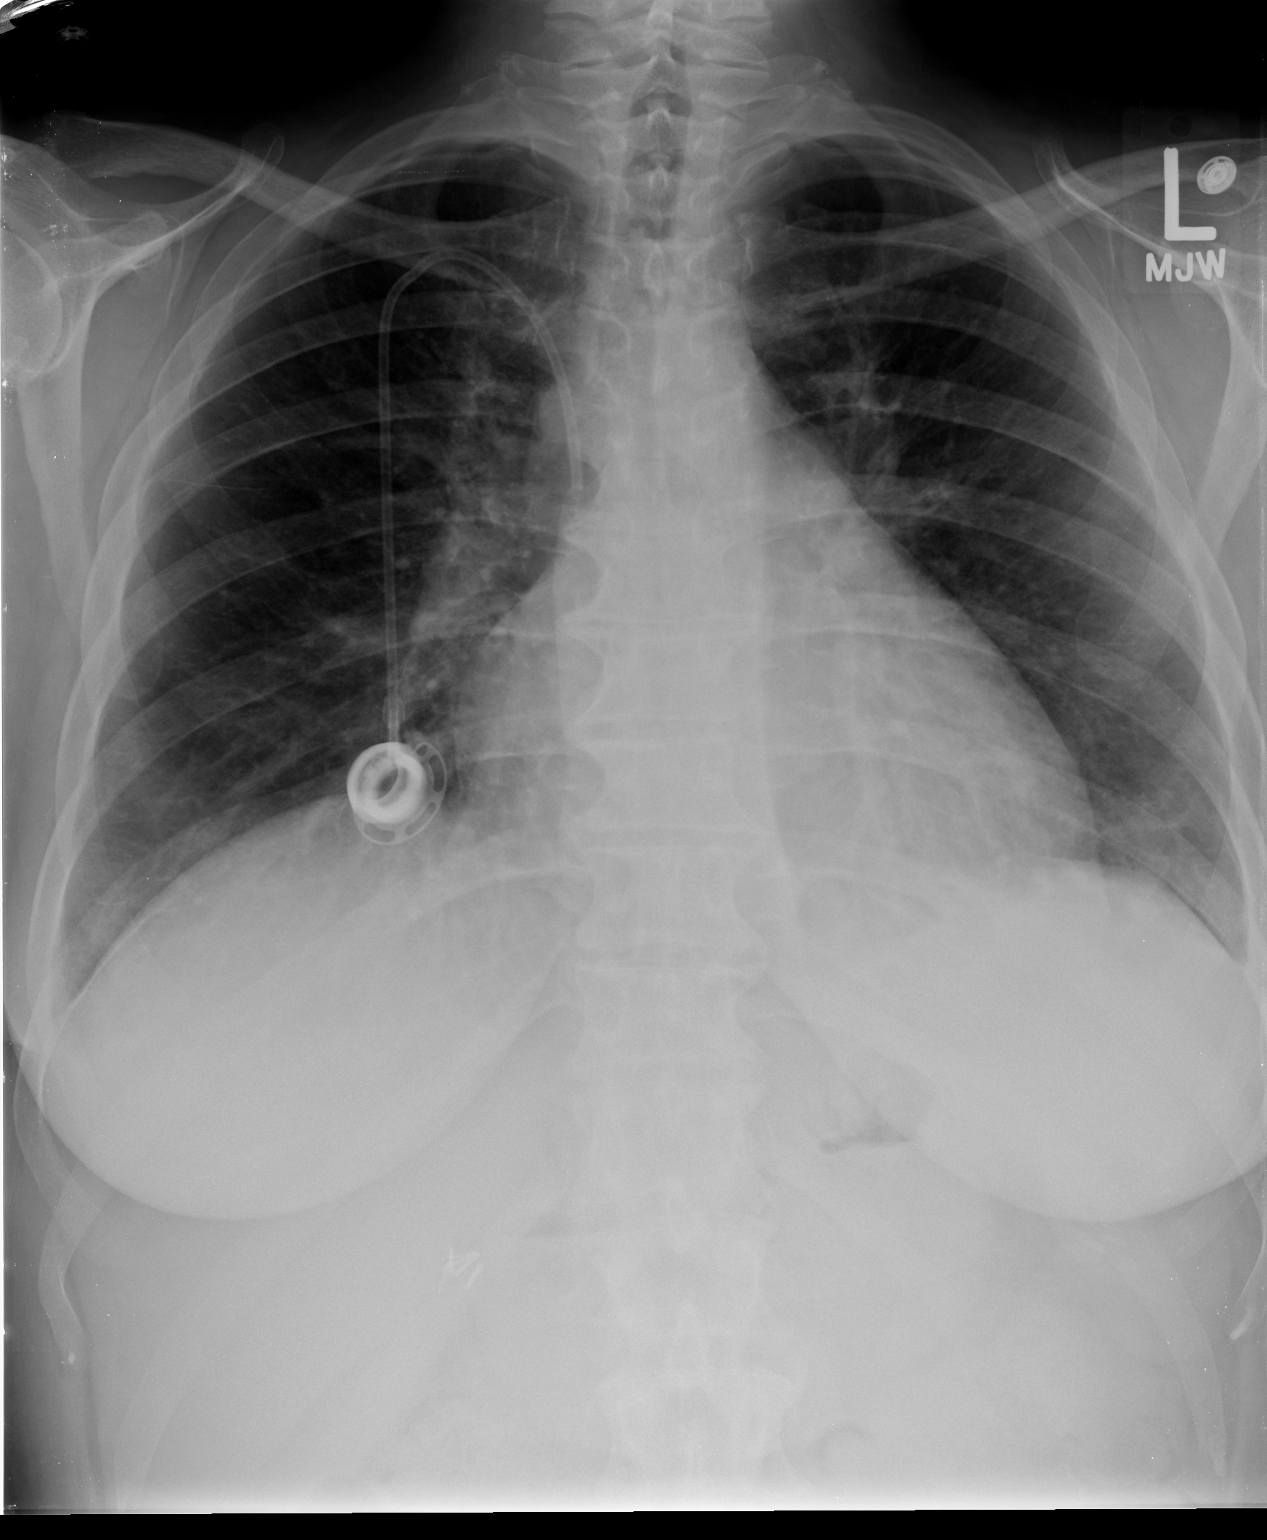

[view not recorded (2 of 2)]
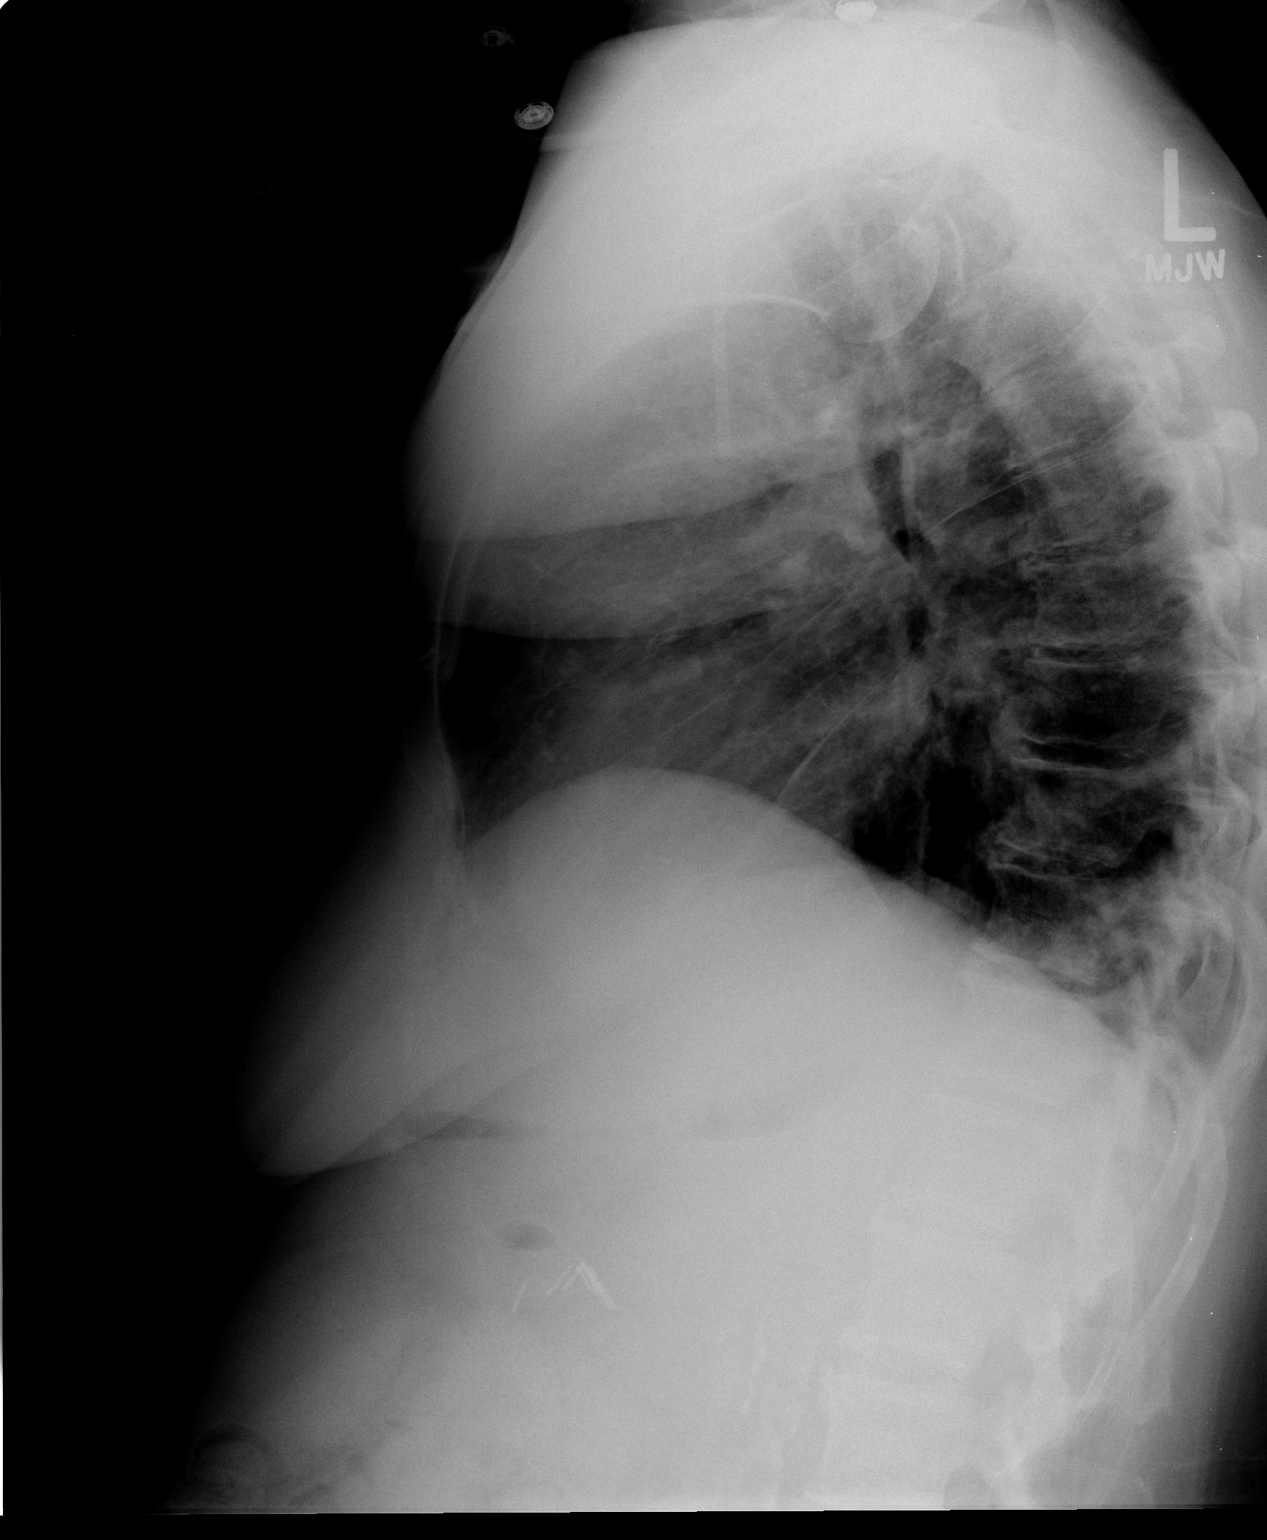

[2 of 2 positions shown; findings below may reference images not displayed]

FINDINGS: Right Port-A-Cath noted with tip projecting over the
superior vena cava.

Thoracic spondylosis is present.  Mild cardiomegaly is again noted.

There is linear subsegmental atelectasis at both lung bases.
Otherwise the lungs appear clear.
IMPRESSION: 1.  Linear bibasilar subsegmental atelectasis.
2.  Cardiomegaly without overt edema.

## 2010-12-28 ENCOUNTER — Emergency Department (HOSPITAL_COMMUNITY): Payer: Medicare Other

## 2010-12-28 ENCOUNTER — Emergency Department (HOSPITAL_COMMUNITY)
Admission: EM | Admit: 2010-12-28 | Discharge: 2010-12-28 | Disposition: A | Discharge status: Home / Self Care | Payer: Medicare Other | Attending: Emergency Medicine | Admitting: Emergency Medicine

## 2010-12-28 DIAGNOSIS — Z7902 Long term (current) use of antithrombotics/antiplatelets: Secondary | ICD-10-CM | POA: Insufficient documentation

## 2010-12-28 DIAGNOSIS — R112 Nausea with vomiting, unspecified: Secondary | ICD-10-CM | POA: Insufficient documentation

## 2010-12-28 DIAGNOSIS — J4489 Other specified chronic obstructive pulmonary disease: Secondary | ICD-10-CM | POA: Insufficient documentation

## 2010-12-28 DIAGNOSIS — I129 Hypertensive chronic kidney disease with stage 1 through stage 4 chronic kidney disease, or unspecified chronic kidney disease: Secondary | ICD-10-CM | POA: Insufficient documentation

## 2010-12-28 DIAGNOSIS — E119 Type 2 diabetes mellitus without complications: Secondary | ICD-10-CM | POA: Insufficient documentation

## 2010-12-28 DIAGNOSIS — R109 Unspecified abdominal pain: Secondary | ICD-10-CM | POA: Insufficient documentation

## 2010-12-28 DIAGNOSIS — F411 Generalized anxiety disorder: Secondary | ICD-10-CM | POA: Insufficient documentation

## 2010-12-28 DIAGNOSIS — I252 Old myocardial infarction: Secondary | ICD-10-CM | POA: Insufficient documentation

## 2010-12-28 DIAGNOSIS — F609 Personality disorder, unspecified: Secondary | ICD-10-CM | POA: Insufficient documentation

## 2010-12-28 DIAGNOSIS — I2510 Atherosclerotic heart disease of native coronary artery without angina pectoris: Secondary | ICD-10-CM | POA: Insufficient documentation

## 2010-12-28 DIAGNOSIS — F329 Major depressive disorder, single episode, unspecified: Secondary | ICD-10-CM | POA: Insufficient documentation

## 2010-12-28 DIAGNOSIS — Z79899 Other long term (current) drug therapy: Secondary | ICD-10-CM | POA: Insufficient documentation

## 2010-12-28 DIAGNOSIS — D649 Anemia, unspecified: Secondary | ICD-10-CM | POA: Insufficient documentation

## 2010-12-28 DIAGNOSIS — J449 Chronic obstructive pulmonary disease, unspecified: Secondary | ICD-10-CM | POA: Insufficient documentation

## 2010-12-28 DIAGNOSIS — F3289 Other specified depressive episodes: Secondary | ICD-10-CM | POA: Insufficient documentation

## 2010-12-28 DIAGNOSIS — N189 Chronic kidney disease, unspecified: Secondary | ICD-10-CM | POA: Insufficient documentation

## 2010-12-28 DIAGNOSIS — Z86711 Personal history of pulmonary embolism: Secondary | ICD-10-CM | POA: Insufficient documentation

## 2010-12-28 DIAGNOSIS — K219 Gastro-esophageal reflux disease without esophagitis: Secondary | ICD-10-CM | POA: Insufficient documentation

## 2010-12-28 LAB — CBC
HCT: 31.7 % — ABNORMAL LOW (ref 36.0–46.0)
Hemoglobin: 10.9 g/dL — ABNORMAL LOW (ref 12.0–15.0)
MCH: 35.4 pg — ABNORMAL HIGH (ref 26.0–34.0)
MCV: 102.9 fL — ABNORMAL HIGH (ref 78.0–100.0)
RBC: 3.08 MIL/uL — ABNORMAL LOW (ref 3.87–5.11)

## 2010-12-28 LAB — DIFFERENTIAL
Eosinophils Absolute: 0.3 10*3/uL (ref 0.0–0.7)
Lymphocytes Relative: 13 % (ref 12–46)
Lymphs Abs: 1.1 10*3/uL (ref 0.7–4.0)
Monocytes Relative: 7 % (ref 3–12)
Neutrophils Relative %: 76 % (ref 43–77)

## 2010-12-28 LAB — BASIC METABOLIC PANEL
BUN: 72 mg/dL — ABNORMAL HIGH (ref 6–23)
Chloride: 89 mEq/L — ABNORMAL LOW (ref 96–112)
Potassium: 4.5 mEq/L (ref 3.5–5.1)
Sodium: 132 mEq/L — ABNORMAL LOW (ref 135–145)

## 2011-01-08 ENCOUNTER — Ambulatory Visit (HOSPITAL_COMMUNITY): Payer: Medicare Other | Admitting: Oncology

## 2011-01-09 NOTE — Assessment & Plan Note (Signed)
NAMEMarland Kitchen  TYRELL, BRERETON               CHART#:  [id number]   DATE:  01/30/2007                       DOB:  1970/11/16   PRESENTING COMPLAINT:  Diarrhea five months' duration, recurrent nausea  and vomiting.   [name] is a 40 year old Caucasian female with juvenile brittle diabetes  mellitus who has been hospitalized at least once a month for nausea and  vomiting and hyperosmolar steroid decay who was last seen by Dr.  Dareen Piano in April 2008.  She EGD on 12/06/2006.  She had normal  examination of the esophagus, bulb and postbulbar duodenum and limited  exam was done because of retained food matter, however, the pyloric  channel was patent.  She has been evaluated at [facility] in the past.  She  has well documented gastroparesis.  She states even though she is on  Reglan high dose she wakes up with nausea and vomiting every morning.  After she takes her meds, things seem to improve an hour later.  No  history of hematemesis or melena.  She now has new problem of diarrhea  which started five months ago.  She says she has diarrhea at least four  to five days each week.  Other days, she has formed stools.  She is  having five to six stools per day.  Stools are loose watery, usually  large volume.  She also has nocturnal diarrhea.  She has abdominal  cramps and burning in mid abdomen.  She has had stool studies by Dr.  Janna Arch, which were negative.  She states every time she has been in  the hospital her troponin level has been elevated and she has an  appointment to be seen by her cardiologist.  She is still smoking but is  now on Chantix to help her quit cigarette smoking.  She is not losing  weight in spite of her chronic nausea and vomiting.   She is on Humalog sliding scale, Lantus 100 units q.h.s., Nexium 40 mg  b.i.d., Plavix 75 mg daily, Phenergan 25 mg b.i.d. p.r.n., Reglan 20 mg  a.c. and q.h.s., Coreg 12.5 mg b.i.d., Lipitor 40 mg daily, Accupril 40  mg daily, Xanax 1 mg q.i.d. p.r.n.,  Vicodin question dose q.i.d.,  Fioricet and/or Advil p.r.n. for migraine.   PAST MEDICAL HISTORY:  Juvenile diabetes age of onset 47, multiple  admissions for nausea, vomiting and poorly controlled diabetes.  She  states she may have been hospitalized at least twelve times last year.  She also has history of anxiety/depression and chronic abdominal pain.  She has coronary artery disease status post stenting.  She also has  diabetic gastroparesis and she also had hyperlipidemia, hypertension,  insomnia and COPD.  Prior surgeries include cholecystectomy.  She has  had five surgeries on her back, her lumbar spine.   ALLERGIES:  TORADOL, MORPHINE, DEMEROL, IODINE DYE, AND COMPAZINE.   OBJECTIVE:  Weight 233 pounds.  She is 5 feet 10-1/2 inches tall.  Pulse  96 per minute.  Blood pressure 170/100.  Temp is 98.  She appears  anxious but not in any distress.  Conjunctivae pink.  Sclerae non-  icteric.  Oropharyngeal mucosa is normal.  No neck masses are noted.  Her abdomen is obese.  Bowel sounds are normal.  No bruit is noted.  On  palpation, she has mild generalized tenderness.  Liver edge is 4-5 cm  below RCM but margin is indistinct.  She has some 2+ peripheral edema.   LABORATORY DATA:  Labs from April admission reviewed.  Her H&H was 12.6  and 35.5.  Her albumin was 2.1.  Her creatinine was 1.03 and BUN was 9.  Urinalysis revealed greater than 3 mg of protein per deciliter.   ASSESSMENT:  1. Diarrhea which is almost chronic as she has had for five months.      Stool studies have been negative.  She has not responded to over-      the-counter therapy.  I suspect that this is multifactorial.  She      could have diabetic diarrhea or related to Reglan or other      medicines.  It is reassuring to know that she is having some days      with normal bowel movements which means she could also have      irritable bowel syndrome.  2. Chronic nausea and vomiting.  She has underlying  gastroparesis.      She is on high-dose Reglan but luckily not having any tremors or      side effects.   PLAN:  1. The patient advised to take first dose of Reglan around 6:00 a.m.      which would be about two hours before she eats her breakfast.      Hopefully, it will cut down on the frequency of her a.m. nausea and      vomiting.  2. Loperamide 2 mg three times a day.  A prescription given, if not      covered she will just buy over-the-counter.  3. Stool diary for the next four weeks.  4. The patient was also given prescription for Carafate 1 gram four      times a day for a month with three refills which seems to help with      her abdominal pain.  5. I would also ask Dr. Janna Arch if he could do a 24-hour urine for      protein and renal function.  I wonder if she has developed diabetic      kidney or nephrotic syndrome since her albumin was 2.1.  I doubt      very much we are dealing with protein-losing enteropathy.  6. If patient's diarrhea does not improve with symptomatic therapy,      would consider colonoscopy with biopsies but I would like to get      clearance from his cardiologist first.   ADDENDUM:  Labs from 01/23/2007 also reviewed.  Her serum albumin is  2.5, H&H 13.6 and 38, platelet count 301,000.  Her AST is 227, ALT 31.  I do not feel that her low albumin is due to liver disease.  She may  have underlying fatty liver.       Lionel December, M.D.  Electronically Signed     NR/MEDQ  D:  01/30/2007  T:  01/30/2007  Job:  [id number]   cc:   Melvyn Novas, MD

## 2011-01-09 NOTE — Assessment & Plan Note (Signed)
OFFICE VISIT   TIANI, [name]  DOB:  August 24, 1971                                       01/13/2010  KXFGH#:[id number]   The patient is a 40 year old woman who has end-stage renal disease but  not presently on hemodialysis who had a right arm AV graft which became  infected and was removed.  She had a left forearm AV graft placed on Dec 29, 2009, by Dr. Darrick Penna.  She comes in today with a very swollen arm and  hand and some areas around the fistula where there were blisters.  There  was mild erythema in those areas.  She had a palpable radial pulse.  She  had good motion and sensation in the hand and states that occasionally  she had a little bit of pain in the hand.   PHYSICAL EXAM:  Vital signs:  Heart rate was 85.  Blood pressure is  160/88 and temperature was 98.1.  She had a positive radial pulse in  left upper extremity.  It was very edematous through the lower arm.  She  had good sensation to motion in the hand.  The hand was warm and pink.  She had a good capillary refill.  She had a couple of areas along the  tunnel area that were healing blisters.  There was some mild erythema.   ASSESSMENT:  Moderate swelling in the left upper extremity status post  AV (arteriovenous) Gore-Tex graft.   PLAN:  The patient was told to elevate the arm above her heart and  continue to have good motion in the hand with flexing and extending her  fingers.  She was given 10 days of reduced dose Keflex and she will  follow up next week to be seen by physician for further evaluation and  workup.   Della Goo, PA-C   Charles E. Fields, MD  Electronically Signed   RR/MEDQ  D:  01/13/2010  T:  01/13/2010  Job:  [id number]

## 2011-01-09 NOTE — Assessment & Plan Note (Signed)
OFFICE VISIT   [name], [name]  DOB:  23-Sep-1970                                       09/27/2009  OZHYQ#:[id number]   Today the patient is being seen for right arm swelling.  She is status  post a right brachiocephalic arteriovenous fistula by Dr. Fabienne Bruns  on September 01, 2009.  She was recently seen for routine follow-up on  September 20, 2009, by physician assistant Della Goo, and was doing  well at that time without evidence of steal syndrome.  On September 24, 2009, her back was itching and she reached over her shoulder with her  right upper extremity and noted pain afterwards.  The following day, she  noted that her right upper extremity was swollen, particularly around  the elbow, and there was bruising.  She contacted on-call vascular  surgeon, Dr. Josephina Gip, who instructed her to keep the arm elevated.  Today, she asked to be seen as she was still concerned because her right  upper arm is still quite painful and swollen.  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  She is also on Plavix for  cardiac stents.  Since she spoke with Dr. Hart Rochester, she does feel that  overall her right upper extremity swelling has improved.  She said the  pain is 10/10 but comes and goes.  It is located at the incision in  upper arm.  She continues to deny any numbness or pain in her right  hand.  She has not had any weakness and denies any dizziness.  She  denies shortness of breath.   PHYSICAL EXAMINATION:  Her blood pressure is 173/92 in her left arm.  Heart rate is 74, respirations 18.  Heart:  Regular rate and rhythm.  Lungs:  Sounds were clear.  Right upper extremity shows a 1 to 2+ radial  pulse.  Her hand is warm. Strength is good.  Her right antecubital  incision is clean, dry, intact.  There is some ecchymosis surrounding  the incision and extending  few inches above and below the incision.  There is peri-incision firmness that extends medially and up to about  mid upper arm.  The skin is warm but does not show any significant  erythema.  The edema is primarily localized to where the ecchymosis is  located.   The patient appears to have a right upper extremity hematoma that  developed after reaching behind her back with the right arm.  She is at  increased risk for bleeding secondary to the fact that she is on Lovenox  and Plavix which cannot be discontinued.  Subjectively, she feels the  swelling is better and at this time there are no signs of infection.  I  think the fact that she is on chronic Lovenox makes DVT of the right  upper excess extremity very unlikely.  I have encouraged her to continue  elevating her right upper extremity as much as possible and have  prescribed Vicodin 5/500 mg 1 tablet p.o. q.4 hours p.r.n. for pain with  quantity of 30 pills with no refills.  Of note, the AV fistula now has a  fairly faint bruit but is still patent.  Therefore I will have her see  Dr. Darrick Penna in 2 weeks to reevaluate the right arm hematoma and  reevaluate her right brachiocephalic fistula to ensure that it is still  maturing.  She has yet to be on dialysis and sees Dr. Kristian Covey on  February 16.  She will call sooner if worsening edema, redness or fevers  or increase in pain.  She will continue on her Lovenox and Plavix and  was encouraged to take her antihypertensive medication secondary to her  hypertension as she did not take it this morning due to repeat lower  extremity venous Doppler.   Jerold Coombe, PA   Quita Skye. Hart Rochester, M.D.  Electronically Signed   AWZ/MEDQ  D:  09/27/2009  T:  09/28/2009  Job:  [id number]

## 2011-01-09 NOTE — Assessment & Plan Note (Signed)
[facility]                          [facility] OFFICE NOTE   NAME:[name], [name]                     MRN:          [id number]  DATE:09/14/2008                            DOB:          1970-09-05    REFERRING PHYSICIAN:  Melvyn Novas, MD   HISTORY OF PRESENT ILLNESS:  The patient is a 40 year old female with  significant mood and psychiatric disorder.  She also has IBS.  The  patient seems to be doing better from that perspective.  She also has a  history of coronary artery disease and underwent a couple of months ago  a stress test which was within normal limits.  She has no recurrence of  chest pain.  She is doing well from a cardiovascular perspective.   She needs refills on her medications.  Her blood pressure is much better  controlled.   MEDICATIONS:  1. Isosorbide 40 mg p.o. daily.  2. Celexa 40 mg p.o. daily.  3. Xanax.  4. Nexium.  5. Lipitor.  6. Plavix.  7. Nortriptyline.  8. Reglan.  9. Zetia.  10.Coreg.  11.Accupril.  12.Lasix.  13.Humalog.   PHYSICAL EXAMINATION:  VITAL SIGNS:  Blood pressure is 110/68, heart  rate is 84, and weights 125 pounds.  NECK:  Normal carotid upstroke.  No carotid bruits.  LUNGS:  Clear breath sounds bilaterally.  HEART:  Regular rate and rhythm.  Normal S1 and S2.  No murmurs, rubs,  or gallops.  ABDOMEN:  Soft and nontender.  No rebound or guarding.  Good bowel  sounds.  EXTREMITIES:  No cyanosis, clubbing, or edema.  NEUROLOGIC:  The patient is alert and oriented, grossly nonfocal.   PROBLEM LIST:  1. Coronary artery disease, stable, status post prior stenting.  2. Mild left ventricular dysfunction, ejection fraction 49%.  3. Lower extremity edema, resolved.  4. Hypertension, controlled.  5. Diabetes mellitus.  6. Functional dyspepsia.  7. Irritable bowel syndrome.  8. Depression and bipolar disorder.   PLAN:  1. From a cardiac standpoint, the patient is doing well.   Again I      advised her to stop smoking.  2. We will repeat her stress test and if there is no evidence of      ischemia, the patient will continue on her current medical regimen.     Learta Codding, MD,FACC  Electronically Signed    GED/MedQ  DD: 09/14/2008  DT: 09/15/2008  Job #: [id number]   cc:   Melvyn Novas, MD

## 2011-01-09 NOTE — Discharge Summary (Signed)
[name], [name]              ACCOUNT NO.:  [id number]   MEDICAL RECORD NO.:  [id number]          PATIENT TYPE:  INP   LOCATION:  A338                          FACILITY:  [facility]   PHYSICIAN:  Melvyn Novas, MDDATE OF BIRTH:  1971-05-12   DATE OF ADMISSION:  DATE OF DISCHARGE:  LH                               DISCHARGE SUMMARY   Essentially, [name] is a 40 year old white female insulin dependent  diabetic who has multiple medical problems including:  1. Chronic noncompliance.  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  3. Depression.  4. Anxiety.  5. Insulin-dependent diabetes.  6. Chronic renal failure.  7. Nephrotic range proteinuria.  8. Iron deficiency.  9. Borderline folate deficiency.  10.Hypertension.  11.COPD.  12.Recent pulmonary emboli documented by perfusion scan.  13.Anticoagulation on Lovenox, now Coumadin.  14.Coronary artery disease, status post cath in April 2009 revealing      60-70% mid stenosis of circumflex RCA dominant 30% proximal      stenosis and 50% mid RCA stenosis.  LAD distribution revealing      stents to the proximal diagonal one branch with 80% stenosis and      mid to distal LAD 40-50% stenosis.  Medical therapy was recommended      at that time.  She was seen by GI.  EGD done in May 2005 just      revealed GERD and some reflux esophagitis.  No colonoscopy was ever      performed.  The patient states Dr. Dionicia Abler but was not necessary      consideration was given to it previously.  15.Anemia.  Presumably, multifactorial, iron deficiency, folate      deficiency, and possibly renal failure.  No evidence of GI blood      loss.  16.Status post lumbosacral fusion and chronic back pain.  17.Bipolar disorder.  18.Chronic diabetic gastroparesis  with multiple admissions for vague      abdominal pain.  19.Hyperlipemia.  20.Hypertension.  21.Right middle lobe pneumonia, active at present.  22.Chronic COPD and continued smoking despite multiple  attempts with      Chantix and counseling.   Pertinent labs on this admission revealed, positive perfusion scan with  multiple defects in the left lobes.  Some mild hypokalemia, potassium is  3.3, reticulocyte count of 2.8, normal being 0.4 to 3.1.  Serum iron  level less than 10.  B12 level 219 within normal limits or folate level  4.9 which is intermediate borderline low.  She is being seen by  Pulmonary and by Nephrology, Dr. Kristian Covey.  Pulmonary agrees with  anticoagulation concern is compliance on everyone's part.  She is also  additionally, now anemic with heme-negative stools.  Her stools have  been negative all along.  Here hemoglobin is down to 7.5 on February 28, 2009, on admission was 9.5.  She is being typed and cross matched for 2  units.  We will have a blood peripheral smear done for analysis prior to  transfusion and then we will transfuse 2 units of packed cells to  increase O2 carrying capacity, due to anemia etiology undetermined  or  even multifactorial in the face of 3-vessle coronary artery disease.   Her current medicines include:  1. Rocephin.  2. Zithromax.  3. Vancomycin.  4. DuoNeb nebulizer q.4 h. while awake 4 liters nasal O2.  5. Coumadin 7.5 mg p.o. daily.  6. KCL 40 mEq p.o. b.i.d.  7. Nu-Iron 150 mg p.o. b.i.d.  8. Folic acid 1 mg p.o. daily.  9. Clonidine 0.1 mg p.o. b.i.d.  10.Lisinopril 40 mg p.o. daily.  11.Norvasc 10 mg p.o. daily.  12.Metformin 500 mg p.o. b.i.d.  13.Rocalcitrol 0.25 mg p.o. daily.  14.Lipitor 40 mg p.o. daily.  15.Coreg 12.5 mg p.o. b.i.d.  16.Nexium 40 mg p.o. daily.  17.Nortriptyline 25 mg p.o. at bedtime.  18.Plavix 75 mg p.o. daily.  19.She is also on Xanax 1 mg p.o. t.i.d. for chronic anxiety disorder,      and the patient seeks opioid analgesia for chronic low back pain      and/or nondescript chronic abdominal pain presumably due to      gastroparesis.   The plan for today is to do blood peripheral smear to transfuse  2 units,  continue to monitor stools for occult blood if any sign of positivity,  we will consider GI consult.  Consideration given to Hematology consult  if renal does not agree on the iron-deficiency renal failure and folic  acid borderline deficiency as causative in her chronic anemia.  Continue  all of the current medicines.  Monitor pulmonary status incentive  spirometry.  She will need home health care to manage multiple medicines  monitoring her renal function, hemodynamics, lipids, and proteinuria.  Likewise, her venous Dopplers were negative on this hospital admission  despite the positive perfusion scan.  Chest x-ray was normal.      Melvyn Novas, MD  Electronically Signed     RMD/MEDQ  D:  03/01/2009  T:  03/01/2009  Job:  [id number]

## 2011-01-09 NOTE — Discharge Summary (Signed)
NAMEMIESHA, BACHMANN              ACCOUNT NO.:  [id number]   MEDICAL RECORD NO.:  [id number]          PATIENT TYPE:  INP   LOCATION:  A306                          FACILITY:  [facility]   PHYSICIAN:  Melvyn Novas, MDDATE OF BIRTH:  1971/07/17   DATE OF ADMISSION:  04/21/2007  DATE OF DISCHARGE:  08/27/2008LH                               DISCHARGE SUMMARY   HISTORY OF PRESENT ILLNESS:  The patient is a 40 year old white female  with extensive and multiple medical problems.  She was admitted to the  emergency room today for volume depletion, recurrent retching, nausea  and vomiting and abdominal pain which is diffuse in nature.  She has had  many previous admissions for the same.   HOSPITAL COURSE:  She was admitted and placed on IV fluids, repleted  with potassium and normal saline, given antiemetics, Phenergan, and  given Reglan.  She did well over a 24 hour period.  She resumed eating.  She had no vomiting or diarrhea.  Labs were essentially within normal  limits.  She is subsequently discharged on the second hospital day.   DISCHARGE INSTRUCTIONS:  She is to continue all of her previous  medicines in the hospital.  She will follow up in the office in one  day's time, to see me in the morning.      Melvyn Novas, MD  Electronically Signed     RMD/MEDQ  D:  04/23/2007  T:  04/24/2007  Job:  [id number]

## 2011-01-09 NOTE — Discharge Summary (Signed)
[name], [name]              ACCOUNT NO.:  [id number]   MEDICAL RECORD NO.:  [id number]          PATIENT TYPE:  INP   LOCATION:  6531                         FACILITY:  [facility]   PHYSICIAN:  Arturo Morton. Riley Kill, MD, FACCDATE OF BIRTH:  [birth year]   DATE OF ADMISSION:  12/23/2007  DATE OF DISCHARGE:  12/25/2007                               DISCHARGE SUMMARY   PRIMARY CARDIOLOGIST:  Learta Codding, MD, [facility]   PRIMARY CARE PHYSICIAN:  Delbert Harness, MD   PROCEDURES PERFORMED DURING HOSPITALIZATION:  Cardiac catheterization,  which was completed by Dr. Tonny Bollman on December 24, 2007, revealing  severe diagonal 1 stenosis, mild-to-moderate diffuse transient ischemic  dilation, normal left ventricular function, patent renal, and successful  percutaneous coronary intervention of the diagonal 1 using angioplasty.   FINAL DISCHARGE DIAGNOSES:  1. Coronary artery disease.      a.     Status post cardiac catheterization with mild-to-moderate       diffuse transient ischemic dilation.      b.     Status post percutaneous coronary intervention with       angioplasty to the diagonal.  2. Diabetes.  3. Chronic abdominal pain.  4. Hypertension.  5. Anxiety.  6. Chronic headache.  7. Polypharmacy.   HOSPITAL COURSE:  This is a 40 year old obese Caucasian female with  known history of CAD, who is seen by Dr. Andee Lineman for substernal chest  pain in [place].  Cardiolite study was found to be equivocal, and the  patient was transferred for cardiac catheterization.  The patient was  seen and examined by Dr. Riley Kill on arrival for catheterization, which  was completed by Dr. Excell Seltzer.  Prior to catheterization, the patient was  having severe nausea and vomiting, and catheterization was delayed x1  day secondary to this.  The patient was also found to be hypertensive  with a blood pressure of 195/113 prior to catheterization.  She also has  a history of allergies to contrast dye, which is treated  prophylactically.   On the day of catheterization, the patient was found to be stable.  Blood pressure and heart rate were well controlled.  She had no further  complaints of nausea.  The patient also had labile blood glucose.  During hospitalization, hemoglobin A1c was found to be 11.  She was kept  on Plavix throughout her hospitalization.   On the day of discharge, the patient was continued to have complaints of  headache, back pain, and abdominal pain.  She denied any further chest  discomfort.  Catheterization site was found to be stable and healthy  without evidence of bleeding, hematoma, or infection.  The patient was  seen by Dr. Riley Kill and given a dose of Phenergan and will be scheduled  for discharge later today.  The patient also received smoking cessation  counseling and will follow up with outpatient cardiac rehab.   DISCHARGE LABS:  Sodium 136, potassium 3.3, chloride 103, CO2 25, BUN  50, creatinine 1.2, and glucose 297.  Hemoglobin 10.6, hematocrit 30.1,  white blood cells 10.0, and platelets 249.  Troponin 0.03, CK  17, and MB  1.8.  The patient's potassium was repleted.   Telemetry revealed sinus rhythm in the 80s, blood pressure 121/61, heart  rate 84, respirations 25, temperature 98.9, and O2 sat 98% on room air.   DISCHARGE MEDICATIONS:  1. Lantus insulin 80 units at night time.  2. NovoLog per sliding scale.  3. Plavix 75 mg daily.  4. Aspirin 81 mg daily.  5. Nexium 40 mg twice a day.  6. Lipitor 40 mg once daily.  7. Seroquel 300 mg at night.  8. Nortriptyline 25 mg daily.  9. Reglan 10 mg after meals at bedtime.  10.Norco 10/325 four times a day.  11.Zetia 10 mg daily.  12.Xanax 1 mg as needed.  13.Desyrel 200 mg at night.  14.Phenergan 25 mg p.r.n.  15.Coreg 18.75 mg twice a day.  16.Accupril 40 mg daily.  17.Celexa 40 mg by mouth daily.  18.Furosemide 40 mg daily.   ALLERGIES:  Toradol, IV DYE, MORPHINE, DEMEROL, and COMPAZINE.   PLANS AND  APPOINTMENTS:  1. The patient is scheduled on March 06, 2008 at 1:15 with Dr. Lewayne Bunting in [place].  2. The patient is to be followed by Dr. Delbert Harness, her primary care      physician, for continued medical management and diabetes      management.  3. The patient has been given postcardiac catheterization instructions      with particular emphasis on the right groin site for evidence of      bleeding, hematoma, or signs of infection.   TIME SPENT WITH THE PATIENT TO INCLUDE PHYSICIAN TIME:  40 minutes.      Bettey Mare. Lyman Bishop, NP      Arturo Morton. Riley Kill, MD, [facility]  Electronically Signed   KML/MEDQ  D:  12/25/2007  T:  12/26/2007  Job:  [id number]   cc:   Delbert Harness, MD

## 2011-01-09 NOTE — Op Note (Signed)
NAMESYLENA, [name]              ACCOUNT NO.:  [id number]   MEDICAL RECORD NO.:  [id number]          PATIENT TYPE:  AMB   LOCATION:  DAY                           FACILITY:  [facility]   PHYSICIAN:  Vickki Hearing, M.D.DATE OF BIRTH:  11-26-1970   DATE OF PROCEDURE:  02/20/2008  DATE OF DISCHARGE:                               OPERATIVE REPORT   PREOPERATIVE DIAGNOSIS:  Foreign body, right knee.   POSTOPERATIVE DIAGNOSIS:  Foreign body, right knee.   PROCEDURE:  Excision of foreign body, right knee.   SURGEON:  Vickki Hearing, MD.   No assistants.   ANESTHETIC:  Local lidocaine 1% with epinephrine.   FINDINGS:  Round foreign body approximately 3 mm x 4 mm in the subcu  tissue.   HISTORY:  This female presents to the office with painful foreign body  in the right knee.  She said it hurt every time she walked or something  touched it.  Informed consent was done in the office.   The patient was brought to the minor surgery suite proper site marking  identification, patient identification was performed.  The patient's  knee was prepped with a Betadine.  A time-out procedure was completed.  Transverse incision was made over the lesion after anesthetic 1%  lidocaine with epinephrine approximately 6 mL and after incision was  made, subcutaneous tissue was bluntly spread and the foreign body was  removed and sent for pathology specimen.  The wound was closed with 3-0  nylon and a 2 x 2 and medium OpSite were applied to the wound and the  patient was in good condition and allowed to go home.  Follow-up  scheduled for June 30.  Sutures out in 10-12 days.      Vickki Hearing, M.D.  Electronically Signed     SEH/MEDQ  D:  02/20/2008  T:  02/20/2008  Job:  [id number]

## 2011-01-09 NOTE — Procedures (Signed)
CEPHALIC VEIN MAPPING   INDICATION:  AV fistula access.   HISTORY:  Renal disease.   EXAM:   The right cephalic vein is compressible.   Diameter measurements range from 0.21 to 0.33 cm.   The right basilic vein is compressible.   Diameter measurements range from 0.44 cm to 0.51 cm.   The left cephalic vein is compressible.   Diameter measurements range from 0.17 to 0.19.  however, too small to  image past mid upper arm.   The left basilic vein is compressible.   Diameter measurements range from 0.25 cm to 0.49 cm.   See attached worksheet for all measurements.   IMPRESSION:  1. Patent right cephalic vein, which is of marginal acceptable      diameter for use as a dialysis access site.  2. The right basilic vein is of acceptable diameter for use as a      dialysis access.     ___________________________________________  Janetta Hora Fields, MD   CB/MEDQ  D:  07/27/2009  T:  07/27/2009  Job:  [id number]

## 2011-01-09 NOTE — Assessment & Plan Note (Signed)
[facility]                          [name] OFF[facility]   [name], [name]                     MRN:          [id number]  DATE:01/23/2008                            DOB:          [birth year]    REFERRING PHYSICIAN:  Melvyn Novas, MD   HISTORY OF PRESENT ILLNESS:  The patient is a 40 year old female with  significant coronary artery disease.  The patient underwent recent  cardiac catheterization by Dr. Riley Kill.  She underwent percutaneous  coronary intervention of a diagonal vessel.  She was found to have  additional significant coronary artery disease with mild-to-moderate  diffuse coronary disease as well as normal LV function and patent renal  arteries.  The patient unfortunately continues to have poor lifestyle  habits.  She continues to smoke.  Her diabetes is uncontrolled, as well  as her hypertension.  She reports dyspnea on exertion and lower  extremity edema which is ongoing and palpitations.   MEDICATIONS:  Listed in the chart and are legion.   PHYSICAL EXAMINATION:  VITAL SIGNS:  Blood pressure 168/105, heart rate  87, weight 290 pounds.  NECK:  Normal carotid upstroke.  No carotid bruits.  LUNGS:  Clear breath sounds bilaterally.  HEART:  Regular rate and rhythm.  Normal sinus.  No murmurs, gallops.  ABDOMEN:  Soft, nontender.  No rebound or guarding.  Good bowel sounds.  EXTREMITIES:  No cyanosis, clubbing, or edema.  NEURO:  Patient alert and oriented.  Grossly nonfocal.   PROBLEM LIST:  1. Coronary artery disease.      a.     Status post percutaneous coronary intervention with cutting       balloon of the diagonal branch (status post restenosis).      b.     Status post stenting in 2003 in [place].      c.     Status post stent of a diagonal vessel in December of 2006.  2. Mild left ventricular dysfunction, ejection fraction 49%.  3. Lower extremity edema.  4. Hypertension, poorly controlled.  5.  Diabetes mellitus, poorly controlled.   PLAN:  1. I spent a considerable amount of time with the patient talking      about therapeutic lifestyle changes.  I have told her that she      needs to talk with her primary care physician regarding aggressive      control of her diabetes and hypertension.  2. We spent a long time talking about discontinuation of tobacco.  The      patient gave me some encouraging signs that she probably will do      so.  3. We will more aggressively control her blood pressure by adding      clonidine 0.1 mg p.o. daily to her medical regimen.  4. The patient will need aggressive monitoring of her lipid panel, and      this can be provided by Dr. Delbert Harness.  5. I have increased the patient's Lasix to 40 b.i.d. x1 week, but his      needs to be tailored back to 40 once  a day, and a BMET will be      followed up in 1 week.     Learta Codding, MD,FACC  Electronically Signed    GED/MedQ  DD: 01/23/2008  DT: 01/23/2008  Job #: [id number]   cc:   Melvyn Novas, MD

## 2011-01-09 NOTE — H&P (Signed)
HISTORY AND PHYSICAL EXAMINATION   October 07, 2009   Re:  [name], [name]              DOB:  March 29, 1971   CHIEF COMPLAINT:  Pseudo-aneurysm, arterial anastomosis of the right  brachiocephalic arteriovenous fistula.   SURGERY:  09/01/2009 right upper arm brachiocephalic arteriovenous  fistula.   HISTORY OF PRESENT ILLNESS:  The patient is a 40 year old female with  history of type 1 diabetes, hypertension, end-stage renal disease who is  not yet on hemodialysis, who had a right brachiocephalic arteriovenous  fistula done by Dr. Fabienne Bruns on September 01, 2009.  She was seen for  routine postop check on January 25th and was doing well at that time.  On January 29th she had an itch on her back and she was reaching over  her shoulder with her upper extremity and noted pain afterwards.  The  following day she noted that the right upper extremity was swollen,  especially in the area of the antecubital incision and there was  bruising.  She was again seen February 1st and found to have a hematoma.  She also has a history of deep vein thrombosis and pulmonary embolism  and is on Lovenox as she is Coumadin resistant.  She also takes Plavix  for cardiac stents.  The patient, over the past 3 days, has noticed an  increase in the size of the mass at the elbow.  The other swelling in  the arm has decreased.  It is now a burning pain.  She denies any  numbness or tingling in the fingers.  Her hand is warm and pink with  good motion, and she states she has had no fevers or chills.  Of note,  she has had a recent GI viral syndrome, which is now resolving, but she  has been unable to take some of her blood pressure medications over the  last several days.   PAST MEDICAL HISTORY:  1. Significant for diabetes type 1.  2. Diabetic gastroparesis.  3. Chronic renal failure not yet on hemodialysis.  4. Hypertension.  5. Coronary artery disease.  6. History of nephrotic  syndrome.  7. History of DVT and pulmonary embolism on chronic Lovenox.  8. Chronic obstructive pulmonary disease.  9. Anxiety and depression disorder.  10.Personality disorder.   ALLERGIES:  Iodine, Compazine, contrast media, morphine, oxycodone,  Ketoralac, aprindine, topiramate, and Nubain.   CURRENT MEDICATIONS:  Include amlodipine 10 mg daily.  Metformin 500 mg  [name].i.d.  Nexium 40 mg daily.  Celexa 80 mg daily.  Plavix 75 mg daily.  Diovan/hydrochlorothiazide 320/12.5 mg daily.  Calcitriol 0.25 mcg  daily.  Reglan 5 mg q.i.d.  Folic acid 1 mg daily.  Torsemide 100 mg  daily.  Coreg 12.5 mg [name].i.d.  Zofran 4 mg q.4 hours.  Isosorbide  mononitrate 30 mg 1 tab daily.  Lisinopril 40 mg daily.  Lovenox 100 mg  daily.  Nortriptyline 25 mg at bedtime.   REVIEW OF SYSTEMS:  The patient feels generally weak from her recent GI  flu-like syndrome.  She denies any fevers or chills, shortness of  breath, chest pain.   PHYSICAL EXAMINATION:  The patient is a well-developed, well-nourished  female in no acute distress.  Her heart rate was 102, blood pressure  203/122, sats were 100%.  HEENT, grossly within normal limits.  Lungs  were clear without wheeze, rales, or rhonchi.  Heart rate and rhythm was  regular with a sinus tachycardia.  Right upper extremity, she had a 1+  radial pulse.  Hand was warm and pink with good sensation and motion.  She had a pulsatile mass approximately 2 x 2 cm at the antecubital space  in the area of the arterial anastomosis.  Wounds to the right upper  extremity from her previous surgery were well healed.   IMPRESSION AND PLAN:  A 2 x 2 cm pseudo-aneurysm at the anastomosis of  the right upper extremity brachiocephalic AV fistula.   PLAN:  For the patient to have repair versus ligation of pseudo-aneurysm  of the right upper extremity brachiocephalic arteriovenous fistula.  The  patient understands the procedure and will be brought in as an  outpatient on  10/08/2009 for the above procedure by Dr. Fabienne Bruns.  These findings were discussed with Dr. Darrick Penna and he will be performing  the procedure 10/08/2009.   Della Goo, PA-C   [place]. Edilia Bo, M.D.  Electronically Signed   RR/MEDQ  D:  10/07/2009  T:  10/07/2009  Job:  [id number]

## 2011-01-09 NOTE — Discharge Summary (Signed)
NAMEENGLAND, GREB              ACCOUNT NO.:  [id number]   MEDICAL RECORD NO.:  [id number]          PATIENT TYPE:  INP   LOCATION:  A307                          FACILITY:  [facility]   PHYSICIAN:  Melvyn Novas, MDDATE OF BIRTH:  1971-07-13   DATE OF ADMISSION:  10/16/2008  DATE OF DISCHARGE:  LH                               DISCHARGE SUMMARY   The patient is a 40 year old white female with a long-standing history  of type I diabetes, chronic noncompliance, hyperlipidemia, hypertension,  personality disorder, chronic abdominal pain, chronic gastroparesis,  coronary artery disease status post stenting, bipolar disorder,  peripheral vascular disease, generalized anasarca, anxiety disorder,  chronic low back pain and hypertension, as well as dysthymia, who  basically presented with generalized abdominal pain, history of nausea  and vomiting.   She was admitted, placed on antiemetics, a clear liquid diet, and  followed with clinical parameters.  While in the hospital, she was noted  to have generalized anasarca again.  Found to have a low generalized  protein and albumin status.  A 24-hour urine for protein and albumin was  obtained revealing proteinuria in the nephrotic range of 6 gm of protein  per 24 hours.  She was seen in consultation by renal, which did make a  work-up looking for a systemic illness other than diabetes to account  for nephropathy and nephrotic syndrome.  CH50 complement and so forth  were done.  Essentially, work-up was unremarkable.  Chronic  noncompliance was an issue.  She had no angina, orthopnea or coronary  artery disease or ischemia while in the hospital.  Her Accupril 40 mg  was changed to lisinopril 40.  She had fluid restriction.  She was  noncompliant.  She continued to leave the hospital despite warnings, and  she had the addition of Demadex up to 100 mg and metolazone with  increasing up to 6.5 mg p.o. b.i.d., compression stockings.  The  patient  was counseled that she would need to be more compliant with medicines,  and her abdominal pain was controlled.  Her subsequent discharge  medicines were as follows.   DISCHARGE MEDICATIONS:  1. Lisinopril 40 mg p.o. daily.  2. Potassium chloride 40 mg p.o. daily.  3. Demadex 100 mg p.o. daily.  4. Metolazone 6.5 mg p.o. b.i.d.  5. Rocaltrol 0.25 mg p.o. daily for secondary hyperparathyroidism.  6. Coreg __________ mg p.o. b.i.d.  7. Lantus 100 mg p.o. h.s.  8. Lipitor 40 mg p.o. daily.  9. Nexium 40 mg p.o. daily.  10.Nortriptyline 25 mg h.s.  11.Phenergan 25 mg b.i.d. p.r.n.  12.Plavix 75 mg p.o. daily.  13.__________ 20 mg p.o. b.i.d. p.r.n.  14.Vicodin 5/500 p.o. t.i.d. p.r.n.  15.Xanax 1 mg p.o. t.i.d. p.r.n.  16.Knee high compression stockings to be worn daily.   FOLLOWUP:  The patient will follow up in my office in 3 days' time.      Melvyn Novas, MD  Electronically Signed     RMD/MEDQ  D:  10/25/2008  T:  10/26/2008  Job:  [id number]

## 2011-01-09 NOTE — H&P (Signed)
NAMEEDMUND, RICK              ACCOUNT NO.:  [id number]   MEDICAL RECORD NO.:  [id number]          PATIENT TYPE:  INP   LOCATION:  A329                          FACILITY:  [facility]   PHYSICIAN:  Melvyn Novas, MDDATE OF BIRTH:  09-01-1970   DATE OF ADMISSION:  06/14/2008  DATE OF DISCHARGE:  LH                              HISTORY & PHYSICAL   HISTORY:  The patient is a 40 year old white female with multiple and  extensive medical and psychiatric illnesses including coronary artery  disease status post stenting x3, noncompliance, chronic noncontrolled  insulin-dependent diabetes, hyperlipidemia, hypertension, anxiety  disorder, and bipolar disorder, who apparently has another episode of  retching, epigastric pain, found to be clinically volume depleted in the  ER, and was subsequently admitted.  She denies any associated  hematemesis, melena, or hematochezia.  She denies anginal chest pain.  She does have some dyspnea on exertion; however, no other cardiac  symptomatology such as diaphoresis, palpitations, dizziness, or syncope.  The patient will be admitted for control of the above symptoms.   PAST MEDICAL HISTORY:  Significant for;  1. Hypertension.  2. Coronary artery disease.  3. Generalized anxiety disorder.  4. Low back pain.  5. Gastroparesis.  6. Insulin-dependent diabetes.  7. Noncompliance.  8. Hypertension.  9. Status post MI.  10.GERD.  11.Bipolar disorder.   PAST SURGICAL HISTORY:  Remarkable for;  1. Cholecystectomy.  2. Cardiac stenting x3.  3. Spinal fusion.   CURRENT MEDICATIONS:  1. Accupril 40 mg p.o. daily.  2. Coreg 12.5 mg b.i.d.  3. Lipitor 40 mg daily.  4. Nexium 40 mg daily.  5. Lantus 100 mg nightly.  6. NovoLog 10 mg before meals t.i.d.  7. Nortriptyline 25 mg nightly.  8. Phenergan 25 mg IM b.i.d. p.r.n.  9. Plavix 75 p.o. daily.  10.Reglan 20 mg subcu b.i.d.  11.Zetia 10 mg daily.  12.Xanax 1 mg q.i.d.  13.Seroquel 400 mg  nightly.  14.Trazodone 300 mg nightly.  15.Metformin 500 mg p.o. b.i.d.  16.Sublingual nitroglycerin spray p.r.n.   PHYSICAL EXAMINATION:  VITAL SIGNS:  Blood pressure is 165/85,  temperature is 97.9, pulse is 90 and regular, she is afebrile, and O2  sat is 97%.  EYES:  PERRLA.  Extraocular movements intact.  Sclerae clear.  Conjunctivae pink.  NECK:  No JVD, no carotid bruits, no thyromegaly, no thyroid bruits.  LUNGS:  Prolonged expiratory phase, scattered rhonchi.  No rales or no  wheezes.  HEART:  Regular rhythm.  No murmurs, gallops, heaves, thrills, or rubs.  ABDOMEN:  Soft.  Bowel sounds are normoactive.  No guarding, rebound, or  masses.  No organomegaly.  EXTREMITIES:  No clubbing, cyanosis, or edema.  NEUROLOGIC:  The patient appears anxious.  Cranial nerves grossly  intact.  The patient moves all 4 extremities.  Plantars are downgoing.   IMPRESSION:  1. Recurrent nausea, vomiting, and retching.  2. Chronic gastroparesis.  3. Insulin-dependent diabetes.  4. Volume depletion.  5. Hypertension.  6. Coronary artery disease.  7. Hyperlipidemia.  8. Bipolar.  9. Depression.  10.Generalized anxiety disorder.   PLAN:  To admit.  IV fluids.  Monitor before meals and at bedtime  glucoses.  An 1800-calorie diet after 24 hours of clear liquids.  Examine abdomen.  Consider sonogram and/or CAT scan if clinically  indicated.  Monitor hemodynamics and I will make further recommendations  as the database expands.      Melvyn Novas, MD  Electronically Signed     RMD/MEDQ  D:  06/15/2008  T:  06/16/2008  Job:  [id number]

## 2011-01-09 NOTE — Assessment & Plan Note (Signed)
OFFICE VISIT   [name], [name]  DOB:  [birth year]                                       09/20/2009  RUEAV#:[id number]   CHIEF COMPLAINT:  The patient is here 2 weeks follow-up for right upper  extremity AV fistula, brachiocephalic, in the upper arm.   ATTENDING PHYSICIAN:  Today in the office is Dr. Hart Rochester.   HISTORY OF PRESENT ILLNESS:  [name] is a 40 year old woman who with  a history of diabetes, hypertension, and coronary artery disease who is  now having end-stage renal disease and may require hemodialysis in the  near future.  A right brachial cephalic upper arm AV fistula was placed  by Dr. Darrick Penna on 09/01/09.  The patient states that she still has some  pain in the area of the incision.  However, she has no signs of steal in  the right upper extremity.  No numbness, tingling or pain in her hand.  She has excellent motion in the hand as well.  She is here today for  follow-up.   PAST MEDICAL HISTORY:  1. Diabetes.  2. Hypertension.  3. Myocardial infarction x3 with placement of cardiac stents.  4. Gastroparesis.  5. Anxiety and depression.   ALLERGIES:  CHEEZE-WHIZ, MORPHINE, DEMEROL, TORADOL, and NUBAIN.   The patient is on Plavix for cardiac stents.   PHYSICAL EXAMINATION:  This is a pleasant female in no acute distress  with somewhat of a flat affect.  She is alert and oriented x3.  She has  a shuffling gait.  Right upper extremity antecubital wound is well-  healed.  She has a positive thrill and bruit to the mid upper arm on the  right.  She has a 2+ radial pulse.  She has good motion and sensation in  the right hand and upper extremity.   ASSESSMENT:  Two weeks status post right upper arm arteriovenous fistula  with good thrill and bruit and no signs of steal syndrome.   PLAN:  Follow up p.r.n.  There was a discussion with the patient  regarding signs of steal.   The patient was also advised that if she needed hemodialysis  sooner than  the fistula being ready, that a PermCath could be placed for  hemodialysis within the next 3 months.  She will follow up with her  renal physicians, and she will return to [place] if there are any issues with  the fistula not maturing.   Della Goo, PA-C   Charles E. Fields, MD  Electronically Signed   RR/MEDQ  D:  09/20/2009  T:  09/20/2009  Job:  [id number]

## 2011-01-09 NOTE — Assessment & Plan Note (Signed)
OFFICE VISIT   [name], [name]  DOB:  02/15/1971                                       07/27/2009  HYQMV#:[id number]   CHIEF COMPLAINT:  Needs dialysis access.   HISTORY OF PRESENT ILLNESS:  The patient is a 40 year old female who has  end-stage renal disease thought to be secondary to hypertension and  diabetes.  Other chronic medical problems include coronary artery  disease, hypertension, elevated cholesterol and diabetes.  All of these  are under control currently and followed by Dr. Janna Arch and Dr.  Kristian Covey.   The patient is right-handed.  She is a former smoker but quit 6 months  ago.  She denies any numbness in her hands.  She does have fairly severe  diabetic gastroparesis and occasionally has required hospitalization for  nausea and vomiting.  She denies any current symptoms of uremia.   PAST MEDICAL HISTORY:  Is otherwise unremarkable.  Medical records from  Dr. Susa Griffins office recent creatinine was 3.  She also has a history  of recent pulmonary embolus and recent DVT and is on Coumadin for this.  She has been on this for approximately 1 month.  She also has a history  of anxiety and bipolar disorder.   PAST SURGICAL HISTORY:  Cholecystectomy and back surgery on 4 previous  occasions.   REVIEW OF SYSTEMS:  Full review of systems on 12 points was reviewed  with the patient.  Please see intake referral form for details.   SOCIAL HISTORY:  She is single, is a former smoker as mentioned above.  Does not consume alcohol regularly.   FAMILY HISTORY:  Her mother had heart disease at an early age.   PHYSICAL EXAM:  Vital signs:  Blood pressure is 216/123 in the left arm,  temperature is 98, heart rate 112 and regular.  HEENT:  Unremarkable.  Neck:  Has 2+ carotid pulses.  No lymphadenopathy.  No bruits.  No JVD.  Chest:  Clear to auscultation.  Cardiac:  Exam is regular rate and  rhythm without murmur.  Abdomen:  Soft, nontender,  nondistended, obese,  no masses.  Extremities:  She has no major joint deformities.  No  significant edema.  Neurological:  Shows symmetric upper extremity and  lower extremity motor strength.  Skin:  Has no ulcers or rashes.  She  has 2+ radial pulse in the left arm and 1+ radial pulse in the right  arm, 1+ femoral pulses bilaterally.  The left leg circumference although  not pitting is approximately 20% larger than the right.  Both legs have  sort of a congested appearance.   She had a vein mapping ultrasound today which showed that the left arm  vein was too small for use as a fistula.  The right arm vein in the  forearm may be usable for a radiocephalic fistula but is also fairly  small in character.  I believe also in light of the patient's obese arms  probably the best option for her is going to be an AV graft.  This has  been scheduled for 09/01/2009.  This was delayed to allow her another  month of anticoagulation therapy prior to proceeding with an operation  since she recently had a pulmonary embolus and recent DVT.  She stated  also that apparently her Coumadin had been stopped recently and she  had  been switched to Lovenox, apparently she was Coumadin resistant.  Risks,  benefits and possible complications and procedure details of placing an  AV graft were explained to the patient today.  She will proceed with the  operation on 01/04.   Janetta Hora. Fields, MD  Electronically Signed   CEF/MEDQ  D:  07/29/2009  T:  07/29/2009  Job:  [id number]

## 2011-01-09 NOTE — Procedures (Signed)
VASCULAR LAB EXAM   INDICATION:  Rule out pseudoaneurysm in right arm.   HISTORY:  Diabetes:  Yes  Cardiac:  MI x3  Hypertension:  Yes   EXAM:  Right arm pseudoaneurysm evaluation.   IMPRESSION:  The patient has had right arm AV fistula removed.  There  does not appear to be any pseudoaneurysm noted.   ___________________________________________  Janetta Hora Fields, MD   CB/MEDQ  D:  11/16/2009  T:  11/17/2009  Job:  [id number]

## 2011-01-09 NOTE — Consult Note (Signed)
NAMECODA, FILLER              ACCOUNT NO.:  [id number]   MEDICAL RECORD NO.:  [id number]          PATIENT TYPE:  INP   LOCATION:  A338                          FACILITY:  [facility]   PHYSICIAN:  Edward L. Juanetta Gosling, M.D.DATE OF BIRTH:  20-Oct-1970   DATE OF CONSULTATION:  02/21/2009  DATE OF DISCHARGE:                                 CONSULTATION   Patient of Dr. Otilio Saber.   REASON FOR CONSULTATION:  Abnormal CT or abnormal perfusion lung scan.    Dictation ended at this point.      Edward L. Juanetta Gosling, M.D.  Electronically Signed     ELH/MEDQ  D:  02/21/2009  T:  02/21/2009  Job:  [id number]

## 2011-01-09 NOTE — Assessment & Plan Note (Signed)
[facility]                          [facility] OFFICE NOTE   NAME:[name], [name] MCJUNKINS                     MRN:          [id number]  DATE:05/11/2008                            DOB:          09-11-1970    REFERRING PHYSICIAN:  Oval Linsey, MD,   HISTORY OF PRESENT ILLNESS:  The patient is a 40 year old female with  significant coronary artery disease as well as significant psychiatric  illness.  The patient is on multiple psychotropic drugs including  Celexa, nortriptyline, trazodone, which recently was discontinued and  Xanax 4 mg a day.  She has less symptoms with irritable bowel syndrome  and functional dyspepsia.  She has to go frequently to the ER for  episodes of vomiting.  She reports chest pain on minimal exertion now,  which she states has worsened over the last month or so.  She also has  frequent blacking out spells, feels that her blood pressure was sky  high.  When she went to the ER, her blood pressure was recorded at  249/139, part of this may have been the effect of clonidine in  conjunction with her nortriptyline, as she is not always compliant with  her clonidine, and they have had a hypertensive reflux.   MEDICATIONS:  1. Isosorbide 40 mg half pill p.o. daily.  2. Celexa 40 mg a day.  3. Xanax 1 mg 4 times a day.  4. Nexium 40 mg p.o. b.i.d.  5. Lipitor 40 mg p.o. daily.  6. Plavix 75 mg daily.  7. Nortriptyline 25 mg p.o. daily.  8. Reglan 10 mg 1-1/2 tablets 4 times a day.  9. Zetia 10 mg p.o. daily.  10.Coreg 18.75 b.i.d.  11.Accupril 40 mg p.o. at bedtime.  12.Lasix 40 mg p.o. daily.  13.Humalog insulin.   A 12-lead electrocardiogram, normal sinus rhythm with no acute ischemic  changes.  Left ventricular hypertrophy.   PHYSICAL EXAMINATION:  VITAL SIGNS:  Blood pressure 165/60, heart rate  97.  NECK:  Normal carotid upstroke and no carotid bruits.  LUNGS:  Clear breath sounds bilaterally.  HEART:  Regular  rate and rhythm.  Normal S1 and S2.  No murmurs or  gallops.  ABDOMEN:  Soft and nontender.  No rebound or guarding.  Good bowel  sounds.  EXTREMITIES:  No cyanosis, clubbing, or edema.  NEURO:  The patient is alert and oriented and grossly nonfocal.   PROBLEMS:  1. Coronary artery disease.      a.     Status post coronary intervention with cutting balloon of       diagonal branch with status post restenosis.      b.     Status post stenting in 2003.      c.     Status post stenting of diagonal branch in December 2006.  2. Mild left ventricular dysfunction.  Ejection fraction 49%.  3. Lower extremity edema.  4. Hypertension, poorly controlled.  5. Diabetes mellitus.  6. Functional dyspepsia.  7. Irritable bowel syndrome.  8. Depression and bipolar disorder.   PLAN:  I will stop the patient's clonidine and  change it to Norvasc.  I  have given a prescription for p.r.n. nitroglycerin.  She does appear to  have worsening chest pain.  We will schedule her for a dobutamine  echocardiographic study.  I was also asked to talk with Dr. Karilyn Cota about  the possibility of using __________  for her functional dyspepsia.     Learta Codding, MD,FACC  Electronically Signed    GED/MedQ  DD: 05/11/2008  DT: 05/12/2008  Job #: [id number]

## 2011-01-09 NOTE — Group Therapy Note (Signed)
NAMELIDDY, DEAM              ACCOUNT NO.:  [id number]   MEDICAL RECORD NO.:  [id number]          PATIENT TYPE:  INP   LOCATION:  A338                          FACILITY:  [facility]   PHYSICIAN:  Edward L. Juanetta Gosling, M.D.DATE OF BIRTH:  11-13-1970   DATE OF PROCEDURE:  02/26/2009  DATE OF DISCHARGE:                                 PROGRESS NOTE   The patient of Dr. Janna Arch.   Ms. Burgoon seems to be doing better.  She still short of breath, but  seems a little bit less so.  She has no other new complaints.   Her exam shows temperature is 97.7, pulse 97, respirations 18, blood  pressure 143/72, O2 sat 66% on 50% then 96% on 50%.  I suspect that she  had taken off her mask.  The lesion on her chin seems to be a little bit  better.  Her chest is clearer than it has been, and I think she looks a  little bit less short of breath, although she is still dyspneic.  Her  white blood count is 11,200, hemoglobin is 8.2, platelets 248.  BMET  shows BUN is 33, creatinine 3.2.   ASSESSMENT:  She has multiple medical problems including some asymmetric  pulmonary edema versus pneumonia, a lesion on her chin.  She is on  antibiotic.  She has some renal dysfunction and so far, we will plan to  continue treatments and follow.  I am going to follow a bit more  peripherally now.      Edward L. Juanetta Gosling, M.D.  Electronically Signed     ELH/MEDQ  D:  02/26/2009  T:  02/27/2009  Job:  [id number]

## 2011-01-09 NOTE — H&P (Signed)
NAMECHARLOTTA, [name]              ACCOUNT NO.:  [id number]   MEDICAL RECORD NO.:  [id number]          PATIENT TYPE:  INP   LOCATION:  A338                          FACILITY:  [facility]   PHYSICIAN:  Melvyn Novas, MDDATE OF BIRTH:  09-07-1970   DATE OF ADMISSION:  02/17/2009  DATE OF DISCHARGE:  LH                              HISTORY & PHYSICAL   The patient is a 40 year old white female well known to me with  recurrent admissions for idiopathic abdominal pain with long-standing  history of severe controlled insulin-dependent diabetes, coronary artery  disease status post stenting, nephrotic range proteinuria, renal  failure, bipolar disorder, diabetic gastroparesis.  The patient was seen  in the office.  She had no significant complaints of abdominal pain over  baseline 2 days prior to admission.  She reported to the emergency room  last night with epigastric severe pain, cramping in nature.  States she  had a normal bowel movement yesterday.  She had some nausea, sometimes  retching, but no vomiting, hematemesis or melena.  No hematochezia.  Initial lipase was normal, and hemoglobin was 11.1.  Renal failure is  chronic, and she has mild low protein and albumin from her nephrotic  syndrome.  The patient will be admitted, placed on clear liquids, bowel  rest, serial electrolytes, and lipases and make further recommendations  depending on clinical and laboratory status.   PAST MEDICAL HISTORY:  Significant for:  1. The aforementioned recurrent abdominal pain.  2. Diabetic gastroparesis.  3. Insulin-dependent diabetes.  4. Noncompliance.  5. Coronary artery disease status post 3 stentings.  6. Nephrotic-range proteinuria.  7. Chronic renal failure.  8. Hypertension.  9. Hyperlipidemia.  10.Generalized anxiety disorder.  11.Bipolar disorder.  12.GERD.  13.Chronic low back pain status post lumbosacral fusion.  14.Status post cholecystectomy.  15.Secondary  hyperparathyroidism.   CURRENT MEDICATIONS:  1. Lisinopril 40 p.o. daily.  2. Potassium chloride 40 mEq daily.  3. Demadex 100 mg p.o. daily.  4. Reglan 5 or 10 mg b.i.d. p.r.n.  5. Rocaltrol 0.25 mg daily.  6. Aspirin 81 mg daily.  7. Coreg 12.5 mg b.i.d.  8. Lipitor 40 mg daily.  9. Nexium 40 daily.  10.Phenergan 25 mg per mL IM b.i.d. p.r.n.  11.Xanax 0.1 mg p.o. t.i.d.  12.Compression stockings.  13.Vicodin 7.5/500 t.i.d.  14.Metformin 500 p.o. t.i.d.   SOCIAL HISTORY:  She lives with her mother and smokes a pack a day.  No  alcohol.   PHYSICAL EXAMINATION:  Blood pressure 141/84, temperature 98.3, pulse 91  and regular, respiratory rate 20.  EYES:  PERRLA.  Extraocular movements intact.  Sclerae clear.  Conjunctivae pink.  NECK:  Showed no JVD.  No carotid bruits.  No thyromegaly.  No thyroid  bruits.  LUNGS:  Showed prolonged expiratory phase with mild expiratory wheeze,  scattered rhonchi and no rales appreciable.  HEART:  Regular rhythm.  No murmurs, gallops, heaves, thrills, or rubs.  ABDOMEN:  Soft, mild epigastric guarding, no rebound tenderness, bowel  sounds normal active, no borborygmi, no peristaltic rushes.  EXTREMITIES:  3+ chronic pitting edema, Homan's sign negative.  NEUROLOGICAL:  Cranial nerves II-XII grossly intact.  The patient moves  all 4 extremities.   IMPRESSION:  1. Recurrent abdominal pain.  2. Diabetic gastroparesis.  3. Insulin-dependent diabetes.  4. Coronary artery disease status post stenting x3.  5. Nephrotic-range proteinuria.  6. Chronic renal failure, diabetic nephrosclerosis.  7. Bipolar disorder.  8. Generalized anxiety disorder.  9. Secondary hyperparathyroidism.  10.Gastroesophageal reflux disease.  11.Hyperlipidemia.   PLAN:  Admit, bowel rest, clear liquids, fluids, IV analgesia, reassess  clinical and laboratory data.  No evidence of small-bowel obstruction at  present.  Will make further recommendations as the database  expands.      Melvyn Novas, MD  Electronically Signed     RMD/MEDQ  D:  02/18/2009  T:  02/18/2009  Job:  [id number]

## 2011-01-09 NOTE — Assessment & Plan Note (Signed)
OFFICE VISIT   SANVI, EHLER  DOB:  06/01/1971                                       11/30/2009  EAVWU#:[id number]   The patient returns for follow-up today.  She previously had a right  brachiocephalic AV fistula which failed.  This is now completely healed.  She presents today for further evaluation with placement of a new  hemodialysis access.   She is right-handed.  She has a 2+ radial and brachial pulse  bilaterally.  Review of her vein mapping ultrasound showed a very small  cephalic vein on the left side with a reasonable diameter basilic vein.  She has also a good size basilic vein on the right side.  I believe the  best option for her at this point will be placement of a left forearm or  possible upper arm AV graft.  I did discuss with her today the  chances  of graft thrombosis of 25% at 1 year.  She is currently not on dialysis.  If this graft was to fail,  then I informed her that we would not try  any further access procedures until she is actually on dialysis since  she would have had 2 failed access procedures prior to even starting  dialysis.  She understands and agrees to proceed.  We have scheduled her  left arm AV graft placement for Dec 27, 2009.     Janetta Hora. Fields, MD  Electronically Signed   CEF/MEDQ  D:  11/30/2009  T:  12/01/2009  Job:  [id number]

## 2011-01-09 NOTE — Assessment & Plan Note (Signed)
OFFICE VISIT   [name], [name]  DOB:  October 14, 1970                                       10/26/2009  RUEAV#:[id number]   The patient returns for follow-up today.  She previously had a right  brachiocephalic AV fistula which developed a pseudoaneurysm at the  anastomosis.  She had a vein patch placed on this on February 12.   On exam today the incision is healing but there is some serous drainage  and slight inflammation around the incision.  She has been placing  Neosporin ointment on this.  I believe that is the reason that the  incision looks fairly wet today.  All sutures were removed.  I told her  discontinue the Neosporin.  She will place a dry dressing on her arm.  She will follow up in 3 weeks.  If her arm is healed at that time will  consider placing a new access on the left side.     Janetta Hora. Fields, MD  Electronically Signed   CEF/MEDQ  D:  10/26/2009  T:  10/27/2009  Job:  [id number]

## 2011-01-09 NOTE — Consult Note (Signed)
[name], [name]              ACCOUNT NO.:  [id number]   MEDICAL RECORD NO.:  [id number]          PATIENT TYPE:  INP   LOCATION:  A338                          FACILITY:  [facility]   PHYSICIAN:  Edward L. Juanetta Gosling, M.D.DATE OF BIRTH:  08/13/1971   DATE OF CONSULTATION:  DATE OF DISCHARGE:                                 CONSULTATION   [name] is a 40 year old with multiple medical problems and who was  admitted with abdominal pain.  She also is complaining of chest pain,  had a somewhat elevated D-dimer and underwent a perfusion lung scan.  She did not have a ventilation scan because of her chest x-ray was  clear.  She was found to have abnormalities on the lung scan suggestive  of clotting.  She says that she has been doing okay.  She is mildly  short of breath.  She has a little bit of chest discomfort.  She has a  long-known medical history with multiple medical problems including  chronic abdominal pain; diabetic gastroparesis; insulin-dependent  diabetes; coronary artery occlusive disease with 3 stentings; nephrotic  range proteinuria; chronic renal failure, which is why she did not have  a CT of the chest; hypertension; hyperlipidemia; generalized anxiety  disorder; bipolar disorder; GERD; chronic low back pain; status post  cholecystectomy; and secondary hyperparathyroidism.   SOCIAL HISTORY:  She lives with family.  She smokes about a pack of  cigarettes daily.   FAMILY HISTORY:  Not known to be positive for any sort of clotting  disorder.  She is somewhat sedentary in general.   REVIEW OF SYSTEMS:  Except as mentioned is negative.  She has lost about  40 pounds because of poor appetite.   PHYSICAL EXAMINATION:  GENERAL:  A well-developed, well-nourished female  who is in no acute distress.  VITAL SIGNS:  Temperature is 97.8, pulse 82, respirations 20, blood  pressure 145/74, O2 sats 95% on room air.  HEENT:  Unremarkable.  CHEST:  Clear with bilateral rhonchi.  HEART:  Regular.  ABDOMEN:  Soft.  EXTREMITIES:  No edema.   Her D-dimer was up and as mentioned her perfusion lung scan shows  moderate-to-large perfusion defect of the left lower lobe and in the  posterior left upper lobe small patchy defects scattered throughout the  right lung.  I think this  is a fairly convincing study for pulmonary emboli.  I would like to get  a venous study done simply because it will help [place] to confirm that  diagnosis.  She will need to be on Coumadin now for at least 6 months  and because of her sedentary nature, she may need a longer period.  Thanks for allowing me to see her with you.      Edward L. Juanetta Gosling, M.D.  Electronically Signed     ELH/MEDQ  D:  02/21/2009  T:  02/21/2009  Job:  [id number]

## 2011-01-09 NOTE — H&P (Signed)
NAMEMAYVIS, AGUDELO              ACCOUNT NO.:  [id number]   MEDICAL RECORD NO.:  [id number]          PATIENT TYPE:  INP   LOCATION:  A307                          FACILITY:  [facility]   PHYSICIAN:  Melvyn Novas, MDDATE OF BIRTH:  1971-02-12   DATE OF ADMISSION:  04/03/2008  DATE OF DISCHARGE:  LH                              HISTORY & PHYSICAL   HISTORY OF PRESENT ILLNESS:  The patient is a 40 year old white female  with long-standing noncompliance insulin-dependent diabetes, chronic  gastroparesis and with increased history of retching and vomiting, and  clinically volume depleted, found also to have a UTI, consideration of  septicemia given her urosepsis.  She has been placed on IV fluids and  observation for ketotic acidosis.  She denies any anginal chest pain,  orthopnea, or PND.   PAST MEDICAL HISTORY:  Significant for,  1. UTI.  2. Insulin-dependent diabetes.  3. Hyperlipidemia.  4. Coronary artery disease, status post stenting.  5. Gastroparesis.  6. Bipolar disorder.  7. Depression and anxiety.   CURRENT MEDICATIONS:  1. Accupril 40 mg a day.  2. Coreg 12.5 b.i.d.  3. Lipitor 40 a day.  4. Nexium 40 b.i.d.  5. Plavix 75 daily.  6. Zetia 10 mg.  7. Seroquel 400 nightly.  8. Xanax 1 mg q.i.d.  9. Lantus insulin 100 mg nightly.  10.Humalog sliding scale 10 units a.c. t.i.d.  11.Metformin 500 b.i.d.   PHYSICAL EXAMINATION:  VITAL SIGNS:  Temperature is 98.4, blood pressure  185/93, pulse 108 and regular, and respiratory rate is 18.  HEAD:  Normocephalic and atraumatic.  EYES:  PERRLA.  Extraocular movements intact.  Sclerae clear.  Conjunctivae pink.  NECK:  No JVD, no carotid bruits, no thyromegaly, or thyroid bruits.  TONGUE:  Dry parched mucosa.  No erythema.  No exudates.  LUNGS:  Clear to A and P.  No rales, wheezes, or rhonchi appreciable.  HEART:  Regular rhythm.  No murmurs, gallops, heaves, thrills, or rubs.  ABDOMEN:  Soft.  Bowel sounds are  hypoactive.  No peristaltic rushes.  No borborygmi.  No guarding, rebound, or hepatosplenomegaly.  EXTREMITIES:  Trace to 1+ pedal edema.  NEUROLOGIC:  Cranial nerves II through XII grossly intact.  The patient  moves all 4 extremities.  Plantars are downgoing.   IMPRESSION:  1. Volume depletion.  2. Dehydration.  3. Consider urosepsis.  4. Insulin-dependent diabetes.  5. Hyperlipidemia.  6. Hypertension.  7. Coronary artery disease.  8. Noncompliance.  9. Bipolar disorder.  10.Generalized anxiety disorder.   PLAN:  Plan is to admit for IV repletion of fluids, monitoring serial  electrolytes.  Watch out for ketoacidosis, a.c. and h.s. glucoses.  Zofran for recurrent nausea, and I will make further recommendations as  the data base expands.      Melvyn Novas, MD  Electronically Signed     RMD/MEDQ  D:  04/04/2008  T:  04/05/2008  Job:  [id number]

## 2011-01-09 NOTE — Group Therapy Note (Signed)
[name], EZZELL              ACCOUNT NO.:  [id number]   MEDICAL RECORD NO.:  [id number]          PATIENT TYPE:  INP   LOCATION:  A338                          FACILITY:  [facility]   PHYSICIAN:  Edward L. Juanetta Gosling, M.D.DATE OF BIRTH:  [birth year]   DATE OF PROCEDURE:  DATE OF DISCHARGE:                                 PROGRESS NOTE   [name] continues to have problems with edema despite large amounts  of diuretics.   Her temperature is 98.9, pulse 87, respirations 20, blood pressure  133/65, O2 sat is 92%, and her weight is 103.87 kg.   Her hemoglobin level this morning was low and I have asked for her to  have a type and cross, but of course, I will leave that decision to Dr.  Janna Arch.  Hemoglobin was 7.7 with a hematocrit of 21.4.  Potassium is  still 3.3, BUN 35, creatinine 3.45.   ASSESSMENT:  She is still having difficulty with renal function edema,  but does seem to be getting better as far as the infectious process is  concerned.  She is anemic and may need transfusion but I will leave that  to Dr. Janna Arch.      Edward L. Juanetta Gosling, M.D.  Electronically Signed     ELH/MEDQ  D:  02/27/2009  T:  02/28/2009  Job:  [id number]

## 2011-01-09 NOTE — Group Therapy Note (Signed)
[name], [name]              ACCOUNT NO.:  [id number]   MEDICAL RECORD NO.:  [id number]          PATIENT TYPE:  INP   LOCATION:  A338                          FACILITY:  [facility]   PHYSICIAN:  Edward L. Juanetta Gosling, M.D.DATE OF BIRTH:  [birth year]   DATE OF PROCEDURE:  DATE OF DISCHARGE:                                 PROGRESS NOTE   A patient of  Dr. Janna Arch.   [name] seems to have now mostly volume problems with marked edema,  and I think that is what is going on, on her chest x-ray at this point.  She seems to be better as far as the infection is concerned.  I think,  at this point, I will plan to go ahead and sign off, but I do appreciate  the opportunity to see her with you and will be glad to see her at any  time at your request.      Ramon Dredge L. Juanetta Gosling, M.D.  Electronically Signed     ELH/MEDQ  D:  02/28/2009  T:  03/01/2009  Job:  [id number]

## 2011-01-09 NOTE — Group Therapy Note (Signed)
[name], [name]              ACCOUNT NO.:  [id number]   MEDICAL RECORD NO.:  [id number]          PATIENT TYPE:  INP   LOCATION:  A338                          FACILITY:  [facility]   PHYSICIAN:  Edward L. Juanetta Gosling, M.D.DATE OF BIRTH:  [birth year]   DATE OF PROCEDURE:  DATE OF DISCHARGE:                                 PROGRESS NOTE   [name] is I think slightly better.  She still has significant  swelling beneath her chin, but otherwise, I think she is a little bit  better.  She is coughing some and complaining of nausea.  She had more  fever last night to 101.8.  She had blood cultures done yesterday.  She  is on vancomycin now.  Otherwise, she is doing I think about the same.  She still has some rhonchi and she is on an, I think, appropriate  treatment now, we will see what happened now that she started  vancomycin.      Edward L. Juanetta Gosling, M.D.  Electronically Signed     ELH/MEDQ  D:  02/24/2009  T:  02/25/2009  Job:  [id number]

## 2011-01-09 NOTE — Consult Note (Signed)
NAMELAURELIN, [name]              ACCOUNT NO.:  [id number]   MEDICAL RECORD NO.:  [id number]          PATIENT TYPE:  INP   LOCATION:  A338                          FACILITY:  [facility]   PHYSICIAN:  Jorja Loa, M.D.DATE OF BIRTH:  Dec 21, 1970   DATE OF CONSULTATION:  DATE OF DISCHARGE:                                 CONSULTATION   REASON FOR CONSULT:  Renal insufficiency.   HISTORY OF PRESENT ILLNESS:  [name] is a 40 year old who was seen  by me in March of this year for elevated BUN and creatinine and also  anasarca during that time, for further workup.  The etiology for renal  insufficiency was thought to be secondary to diabetes and the patient  was put on diuretics and discharged home to be followed as an  outpatient.  The patient was seen in the office one time.  Since then,  she did not come presently as stated above.  She was hospitalized also  for abdominal pain and also according to her, had difficulty breathing.  She also has some nausea but no vomiting.  The patient seems to have  chronic abdominal pain, and she has been admitted multiple times because  of a similar problem.  Presently, however, also as stated above, she was  having shortness of breath which she is feeling better now.  At this  moment, her main complaint seems to be increasing swelling of her legs.   PAST MEDICAL HISTORY:  History of renal failure, possibly stage III to  IV; history of diabetes; history of diabetic gastropathy; history of  nephrotic-range proteinuria; history of coronary artery disease status  post stent placement; history of hypertension; history of anxiety  disorder; history of bipolar disorder; history of GERD; history of  dyslipidemia; history of chronic back pain; and history of secondary  hyperparathyroidism.  At this moment, the patient also seems to have  history of PE, which is a new diagnosis.   MEDICATIONS:  1. Norvasc 10 mg p.o. daily.  2. Aspirin 81 mg p.o.  daily.  3. Rocaltrol 0.25 mg p.o. daily.  4. Coreg 12.5 mg p.o. b.i.d.  5. Plavix 75 mg p.o. daily.  6. Lovenox 100 mg subcu q.24 h.  7. She is also on insulin, Prinivil 40 mg p.o. daily.  8. Protonix 80 mg p.o. daily.  9. Crestor 20 mg p.o. daily.  10.K-Dur 20 mEq p.o. daily.  11.She is also on Demadex 100 mg p.o. daily.  12.She is on Coumadin 5 mg p.o. daily and she is also on Xanax 1 mg      and she is also on p.r.n. Zofran and diuretic.  She is getting IV      fluid at 100 mL an hour.   ALLERGIES:  She is allergic to CODEINE, CONTRAST MEDIA, MORPHINE  SULFATE, OXYCODONE, DEMEROL, TOPIRAMATE, NUBAIN, and also KETOROLAC.   SOCIAL HISTORY:  She has history of smoking.  No history of alcohol  abuse.  Lives with her mother.   FAMILY HISTORY:  No history of renal failure.   REVIEW OF SYSTEMS:  Her main complaint seems to be  swelling of the leg.  She states she has shortness of breath, is feeling better.  She does not  have any nausea, but her appetite is not great.  She does not have any  orthopnea or paroxysmal nocturnal dyspnea.  She is not making that much  amount of urine, but she feels thirsty.  She denies any diarrhea.  As  stated above, she complains of increased swelling of the legs, left  compared to the right.   PHYSICAL EXAMINATION:  GENERAL:  The patient is alert, in no apparent  distress.  VITAL SIGNS:  Her temperature is 97.7, pulse of 82, blood pressure  141/64.  CHEST:  Clear to auscultation.  No rales.  No rhonchi, but she has some  wheezing, mainly expiratory.  HEART:  Her heart exam reveals regular rate and rhythm.  Distant S1, S2.  No murmur.  ABDOMEN:  Difficult to palpate organomegaly.  EXTREMITIES:  She has significant edema on both legs above her knee,  left greater than right.  At this moment, her output is not documented.   LABORATORY DATA:  Her blood work from June 27; sodium 136, potassium  3.5, BUN is 28, creatinine is 2.5.  Her GFR is 25 mL/min.   Her calcium  is 8.1, magnesium is 1.9.  Her white blood cell count was 11.1 and 34.2  from June 2010.  She has previous ultrasound which basically showed  nephromegaly.   ASSESSMENT:  1. Renal failure, chronic.  At this moment, probably early stage III      or stage IV, thought to be secondary to diabetes.  Her BUN and      creatinine seem to be stable.  2. History of nephrotic syndrome.  Since the patient does have      diabetic gastropathy and retinopathy, the most likely etiology      seems to be secondary to diabetes.  The patient presently on ACE      inhibitor.  3. 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  4. History of hypertension.  Her blood pressure seems to be      controlled, reasonably good.  She is on Norvasc 10 mg p.o. daily.      She is also on Prinivil 40 mg p.o. daily.  5. History of anasarca, possibly related to her nephrotic syndrome;      however, the patient also drinks lots of fluid.  Presently also,      she is on IV fluid, she has significant anasarca.  Previously, she      had a similar problem, and she was treated with IV Lasix and      metolazone.  When she was discharged, she was put on Demadex and      metolazone.  However, at this moment, the patient is only taking      metolazone only.  6. History of pulmonary embolism.  She is presently on Coumadin.  7. History of diabetes.  She is on hypoglycemic agent and also on      insulin.  Random blood sugar still seems to be somewhat high.  8. History of hypercholesterolemia.  She is on Crestor.  9. History of obesity.  10.History of bipolar disorder.   RECOMMENDATIONS:  We will start her on Lasix IV.  At this moment, we  will discontinue Demadex.  We will discontinue also her IV fluid.  We  will put her back on metolazone.  We will check her protein to  creatinine ratio  to see the improvement in her nephrotic syndrome.  We  will continue with other treatment.  We will check also her  phosphorus  and also intact PTH.  We will check also her 25-vitamin D level.      Jorja Loa, M.D.  Electronically Signed     BB/MEDQ  D:  02/21/2009  T:  02/22/2009  Job:  [id number]

## 2011-01-09 NOTE — H&P (Signed)
NAMESHAMYRA, [name]              ACCOUNT NO.:  [id number]   MEDICAL RECORD NO.:  [id number]          PATIENT TYPE:  INP   LOCATION:  A306                          FACILITY:  [facility]   PHYSICIAN:  Melvyn Novas, MDDATE OF BIRTH:  1971-07-01   DATE OF ADMISSION:  04/21/2007  DATE OF DISCHARGE:  LH                              HISTORY & PHYSICAL   The patient is a 40 year old white female with multiple and extensive  medical problems including severe diabetic gastroparesis, recurrent  abdominal pain, etiology undetermined, hypertension, hyperlipidemia,  coronary artery disease and noncompliance, who basically complains of  increasing diffuse abdominal pain, some episodes of retching, no  vomiting, melena, hematemesis, hematochezia.  In the office she appeared  to have a dry tongue and be clinically volume-depleted, subsequently  admitted for control of hydration, nausea, and monitoring of her  recurrent abdominal pain.  This has been worked up multiple times.   PAST MEDICAL HISTORY:  1. Insulin-dependent diabetes.  2. Noncompliance.  3. Severe gastroparesis.  4. Depression and anxiety.  5. Unexplained abdominal pain.  6. Hypertension.  7. Hyperlipidemia.  8. Coronary artery disease.   PAST SURGICAL HISTORY:  1. Cholecystectomy.  2. Lumbosacral laminectomies followed by subsequent fusion of      lumbosacral area.  3. Coronary artery disease with stenting x2 in 2003.   She has multiple intolerances to various medicines but no true  allergies.   CURRENT MEDICINES:  1. Lantus 120 units h.s.  2. Humalog sliding scale a.c. and h.s.  3. Accupril 20 mg p.o. daily.  4. Coreg 6.25 mg q.12h.  5. Lipitor 40 mg per day.  6. Aspirin 81 mg per day.  7. Plavix 75 mg p.o. daily.  8. Reglan 20 mg p.o. h.s. and a.c. p.r.n.  9. Avandamet 4/500 mg p.o. b.i.d.  10.Xanax 1 mg p.o. q.i.d. given by psychiatry.   Blood pressure was 148/82, pulse is 80 and regular, afebrile,  respiratory rate is 18.  Head:  Normocephalic, atraumatic.  Eyes:  PERRLA, extraocular movements  intact.  Sclerae clear.  Conjunctivae pink.  Tongue is dry.  Oropharynx  shows mild hyperemia.  NECK:  She has no JVD, no carotid bruits, no thyromegaly, no thyroid  bruits.  LUNGS:  Prolonged expiratory phase, no rhonchi, no wheezes or rales are  audible.  HEART:  Regular rhythm.  No murmurs, gallops, heaves, thrills or rubs.  ABDOMEN:  Soft.  Bowel sounds hypoactive.  There are no peristaltic  rushes, no borborygmi, no significant specific right upper quadrant  tenderness.  There is mild voluntary guarding, no rebound when  distracted.  EXTREMITIES:  No clubbing, cyanosis, or edema.  NEUROLOGIC:  Cranial nerves II-XII are grossly intact.  The patient  moves all four extremities.  Plantars are downgoing.   IMPRESSION:  1. Recurrent abdominal pain.  2. Intravascular volume depletion manifested by dry oral mucosa.  3. Rule out metabolic electrolyte abnormalities.  4. Gastroparesis.  5. Insulin-dependent diabetes.  6. Hyperlipidemia.  7. Hypertension.  8. Coronary artery disease, status post stenting.   PLAN:  Admit and as per orders.  Melvyn Novas, MD  Electronically Signed     RMD/MEDQ  D:  04/22/2007  T:  04/23/2007  Job:  [id number]

## 2011-01-09 NOTE — Cardiovascular Report (Signed)
NAMECIARRA, BRADDY              ACCOUNT NO.:  [id number]   MEDICAL RECORD NO.:  [id number]           PATIENT TYPE:   LOCATION:                                 FACILITY:   PHYSICIAN:  Veverly Fells. Excell Seltzer, MD  DATE OF BIRTH:  07/06/1971   DATE OF PROCEDURE:  DATE OF DISCHARGE:                            CARDIAC CATHETERIZATION   PROCEDURE:  Right heart catheterization, left heart catheterization,  selective coronary angiography, selective bilateral renal angiography,  left ventricular angiography, and PTCA of the first diagonal branch.   INDICATIONS:  Ms. Nakamura is a 40 year old woman with complex medical  problems.  She has poorly-controlled diabetes, ongoing tobacco abuse,  and known coronary artery disease.  She also has had signs and symptoms  of congestive heart failure.  She was referred for right and left heart  catheterization.  She also has uncontrolled hypertension and bilateral  renal angiography was requested.   Risks and indications of the procedure were reviewed with the patient.  Informed consent was obtained.  The right groin was prepped, draped, and  anesthetized with 1% lidocaine.  Using modified Seldinger technique, a 6-  French sheaths placed in the right femoral artery.  Standard 6-French  Judkins catheters were used for coronary angiography and left  ventriculography.  A 5-French LIMA catheter was used for bilateral  selective renal angiography.  Venous access was then obtained also using  the modified Seldinger technique.  A 7-French sheath was placed the  right femoral vein.  A Swan-Ganz catheter was used for the right heart  cath and standard protocol was followed.  Oxygen saturations were drawn  from the pulmonary artery and femoral artery.  Fick cardiac output was  calculated.   At the completion of the diagnostic procedure, I elected to intervene on  the first diagonal branch.  There was moderate diffuse disease in the  right coronary artery, LAD, and  left circumflex, but no critical  stenoses.  However, the first diagonal branch has severe in-stent  restenosis in the proximal edge of the stent.  It was a moderately large  diagonal.  Angiomax was used for anticoagulation.  Once therapeutic ACT  was achieved, a 6-French XB LAD 4-cm guide catheter was inserted.  A  Cougar guidewire was used to wire the vessel without difficulty.  I  elected to use a cutting balloon.  The stent type is unknown, but the  restenosis appearance is that of a drug-eluting stent.  The stent was  placed in 2003.  The severe stenosis was along the proximal edge of the  stent.  A 2.5- x 15-mm Flextome cutting balloon was inflated to 8 and 10  atm on four inflations with improvement in the lesion.  Post cutting  balloon angiography demonstrated only 20% residual stenosis and this was  a satisfactory angiographic result.  The patient tolerated the procedure  well and had no immediate complications.   FINDINGS:  Right atrial pressure mean of 9, right ventricular pressure  is 34/12, pulmonary artery pressure 33/20 with mean of 26, pulmonary  capillary wedge pressure mean of 18, left ventricular pressure 156/21,  and aortic pressure 158/91 with a mean of 118.   Oxygen saturation, pulmonary artery 67 and aorta 94.   Cardiac output 6.9 L per minute.  Cardiac index 3.2 L per minute per  meter squared.   Left mainstem.  The left main is mildly patent and bifurcates into the  LAD and left circumflex.  There are no significant stenoses.   LAD.  The LAD is a large-caliber vessel that courses down and wraps  around the LV apex.  It supplies a large first diagonal branch that has  a long stent.  The proximal edge of the stent into the stent itself has  80% stenosis.  The mid and distal portions of the diagonal stent are  widely patent as are the remainder of the vessel.  The LAD is very large  in caliber and has a 40%-50% mid stenosis.  The remaining portions of  the mid  distal LAD have no significant stenosis.   Left circumflex.  The left circumflex is widely patent in the proximal  aspect.  There are luminal irregularities present.  The midportion has a  moderate stenosis of 60%-70% at the bifurcation of the AV groove circ  and a large OM branch.  There is moderate calcification in that region.  The OM supplies 2 branch vessels, and the AV groove circ supplies 2  posterolateral branches.  There is no other significant stenosis  throughout.   The right coronary artery is a large dominant vessel.  There is a 30%  proximal lesion, 50%-60% mid lesion, and distally the vessel has no  significant stenosis.  The vessel bifurcates into a PDA and 2  posterolateral branches.  It is also a large acute marginal branch and a  moderate-sized proximal RV marginal branch.  Several views were taken  and intracoronary nitroglycerin was given to evaluate the mid lesion  which is in an area of tortuosity making it difficult to assess  angiographically.  However, it does not appear to be severely stenosed.   Left ventriculography shows normal LV function, LVEF 60%.   Renal angiography shows widely patent bilateral renal arteries.   ASSESSMENT:  1. Severe diagonal branch stenosis.  2. Mild-to-moderate diffuse coronary artery disease as detailed above.  3. Normal left ventricular function.  4. Patent renal arteries.  5. Successful percutaneous coronary intervention of the first diagonal      branch, restenosis with a cutting balloon.   DISCUSSION:  Ms. Vensel has a high plaque burden.  She has moderately  tight disease in the mid circumflex and mid right coronary artery.  Neither of these lesions appear critical, and I think her diagonal  stenosis was the tightest of all of these.  I believe she would be best  served by ongoing medical therapy.  Her diabetes and hypertension are  completely out of control, and she continues to use tobacco.  She will  be counseled  about the importance of lifestyle changes and adherence to  medical therapy.  I think it is best considering her history did not  place further stents unless she has severe stenoses.      Veverly Fells. Excell Seltzer, MD  Electronically Signed     MDC/MEDQ  D:  12/24/2007  T:  12/24/2007  Job:  [id number]   cc:   Learta Codding, MD,FACC  Melvyn Novas, MD

## 2011-01-09 NOTE — Procedures (Signed)
VASCULAR LAB EXAM   INDICATION:  Right upper extremity lump in area of AVG, rule out  pseudoaneurysm.   HISTORY:  Diabetes:  Yes.  Cardiac:  MI.  Hypertension:  Yes.   EXAM:  Right upper extremity limited arterial duplex.   IMPRESSION:  1. Right upper extremity antecubital fossa was imaged with no evidence      of pseudoaneurysm.  2. Right arteriovenous graft appears patent with limited visualization      of anastomosis area.   ___________________________________________  Janetta Hora. Fields, MD   AS/MEDQ  D:  04/20/2010  T:  04/20/2010  Job:  [id number]

## 2011-01-09 NOTE — Procedures (Signed)
VASCULAR LAB EXAM   INDICATION:  Swelling / burning around AV fistula site.   HISTORY:  End-stage renal disease.  Diabetes:  Cardiac:  Hypertension:   EXAM:  Duplex of right AV fistula.   IMPRESSION:  There appears to be a pseudoaneurysm measuring 2.20 x 1.89  cm at the proximal anastomosis of the arteriovenous fistula site of the  cephalic vein.   ___________________________________________  Quita Skye. Hart Rochester, M.D.   CB/MEDQ  D:  10/07/2009  T:  10/08/2009  Job:  [id number]

## 2011-01-09 NOTE — Assessment & Plan Note (Signed)
OFFICE VISIT   [name], [name]  DOB:  Jul 13, 1971                                       04/20/2010  ZOXWR#:[id number]   The patient presented today complaining of increase in size mass in her  left arm.  She previously had a left forearm AV graft placed in May of  2011.  She really has had no difficulty with the graft but thought that  there was a more prominent area near the anastomosis.  She denies any  fever or chills.  She denies any drainage from the incision.   PHYSICAL EXAM:  Blood pressure is 159/90 in the right arm, heart rate is  92 and regular.  Temperature is 97.7.  She has an easily palpable thrill  and audible bruit in the graft.  There is no significant edema in the  arm.  There is no surrounding erythema.  Hand is warm and well-perfused.   We did a duplex ultrasound of her graft at the anastomosis in the area  that she felt was more prominent.  This showed no evidence of  pseudoaneurysm and the prominent area seems to primarily be only the  graft that is palpable.  There is no hematoma.  There is no lymphocele.  Overall the patient appears well.  Her graft is in good shape at this  point.  She will follow up with [place] on an as-needed basis.     Janetta Hora. Fields, MD  Electronically Signed   CEF/MEDQ  D:  04/20/2010  T:  04/21/2010  Job:  [id number]   cc:   [facility]

## 2011-01-09 NOTE — Consult Note (Signed)
NAMESHAQUELLA, [name]              ACCOUNT NO.:  [id number]   MEDICAL RECORD NO.:  [id number]          PATIENT TYPE:  INP   LOCATION:  A312                          FACILITY:  [facility]   PHYSICIAN:  [name], M.D.DATE OF BIRTH:  05-16-1971   DATE OF CONSULTATION:  04/24/2009  DATE OF DISCHARGE:                                 CONSULTATION   REASON FOR CONSULT:  Renal insufficiency.   [name] is a 40 year old Caucasian female who has stage IV renal  failure, nephrotic syndrome and history of diabetes, presently came  because of nausea and vomiting, poor appetite and unable to take her  medication.  Since the patient was not able to take her medication, she  states that she started having again swelling of her legs and also some  difficulty in breathing.  At this moment, she states she is feeling  better.  She has still some nausea, but no vomiting.  She denies any  shortness of breath, dizziness or lightheadedness.   PAST MEDICAL HISTORY.:  1. Chronic renal failure, later stage III, early stage IV, possibly      secondary to diabetes.  2. History of nephrotic syndrome.  3. History of diabetic gastropathy.  4. History of coronary artery disease status post stent placement.  5. History of hypertension.  6. History of anxiety disorder.  7. History of bipolar disorder.  8. History of dyslipidemia.  9. History of chronic back pain.  10.History of secondary hyperparathyroidism.  11.History of PE.  12.History of GERD.   MEDICATIONS:  At this moment consist of;  1. Norvasc 10 mg p.o. daily.  2. Aspirin 81 mg p.o. daily.  3. Rocaltrol 0.25 mcg p.o. daily.  4. Celexa 40 mg p.o. daily.  5. Clonidine 0.3 mg p.o. b.i.d.  6. Lovenox 100 mg subcu q.12 h.  7. Folic acid 1 mg p.o. daily.  8. Hydrochlorothiazide 25 mg p.o. daily.  9. Novolin insulin.  10.Lantus 70 units nightly.  11.Imdur 30 mg p.o. daily.  12.She is also on Zaroxolyn 5 mg p.o. daily.  13.Renagel 800 mg p.o.  b.i.d.  14.IV fluids at 50 mL per hour.  15.Dilaudid.  16.Zofran on p.r.n. basis.   ALLERGIES:  She is allergic to IODINE, MORPHINE, DEMEROL, TOPAMAX and  NUBAIN.   SOCIAL HISTORY:  No history of illicit drug use.  The patient also  denies any history of alcohol abuse.  She has history of smoking.  Lives  with her mother.   FAMILY HISTORY:  No history of renal failure.   REVIEW OF SYSTEMS:  Her main complaint seems to be nausea, also some  vomiting, unable to take anything down, associated with some abdominal  pain followed by increase in swelling of her legs and pain on her left  leg.  She denies any shortness of breath, orthopnea or paroxysmal  nocturnal dyspnea.  She denies any urgency or frequency.  She denies  also any diarrhea.  No fevers, chills, or sweating.   PHYSICAL EXAMINATION:  GENERAL:  The patient is alert in no apparent  distress.  VITAL SIGNS:  Her blood pressure is  98.1, pulse of 85, blood pressure  134/86, O2 saturation is 99.  CHEST:  Decreased breath sounds, otherwise seems to be clear.  No rales,  no rhonchi.  She has some wheezing.  HEART:  Regular heart rate and rhythm.  No murmur.  No S3.  ABDOMEN:  Soft, positive bowel sounds.  EXTREMITIES:  She has possibly 3+ edema on the left leg, about 2 to 3+  edema on the right leg.   She has about 700 mL of urine output.  Her white blood cell count is  10.3, hemoglobin is 8, hematocrit 22.8 and platelet of 360.  Her INR is  1.2.  PT is 14.6.  Her sodium is 176, potassium 3.3, BUN is 23,  creatinine 2.23, GFR is 24 mL per hour.   ASSESSMENT:  1. Renal failure, chronic stage IV.  At this moment, renal function      seems to be stable.  2. History of anemia, a combination of iron-deficiency anemia.  The      patient was previously put on iron and also she was seen by Dr.      Mariel Sleet, I am not sure whether the patient has received any IV      iron.  Her H and H seems to have come down.  3. Nephrotic  syndrome.  The patient was put on ARBs.  Presently, she      is not taking her medication.  There is a big issue of      noncompliance, sometime she says she cannot get her medication.      Because of that, she is not taking now any ARBs or ACE inhibitor.  4. History of diabetes.  She is on insulin.  Her blood sugar is not      very well controlled.  5. History of anasarca with a combination of uncontrolled fluid and      salt intake and noncompliance with her medication and also      secondary to nephrotic syndrome.  6. History of hypertension.  Her blood pressure seems to be controlled      reasonably good.  7. History of hyperparathyroidism.  She is on calcitriol.  8. History of hyperphosphatemia.  She is on Renagel.  9. History of pulmonary embolism.  She has been on Coumadin.      Presently, her PT and INR seems to be normal.  10.History of coronary artery disease status post stent placement.  11.History of gastroesophageal reflux disease, presently came with      nausea and vomiting, reactivation of her diabetic gastropathy.  12.History of bipolar disorder.  13.History of chronic back pain.   RECOMMENDATIONS:  I will start her on Lasix 120 mg IV b.i.d.  We will  hold her Demadex.  We will continue with metolazone.  We will put her  also on KCl 40 mEq p.o. b.i.d.  We will discontinue her IV fluid and  continue with other medication.  Probably, we will put her back on ARBs.  We will continue her other medications and we will follow the patient.      [name], M.D.  Electronically Signed     BB/MEDQ  D:  04/25/2009  T:  04/25/2009  Job:  [id number]

## 2011-01-09 NOTE — Consult Note (Signed)
[name], [name]              ACCOUNT NO.:  [id number]   MEDICAL RECORD NO.:  [id number]           PATIENT TYPE:  INP   LOCATION:  A307                          FACILITY:  [facility]   PHYSICIAN:  Jorja Loa, M.D.DATE OF BIRTH:  [birth year]   DATE OF CONSULTATION:  DATE OF DISCHARGE:                                 CONSULTATION   REASON FOR CONSULTATION:  Elevated BUN and creatinine.   HISTORY OF PRESENT ILLNESS:  [name] is a 40 year old Caucasian  female who has multiple past medical history including history of  coronary artery disease status post stent placement, history of diabetes  for more than 25 years, history of GERD, bipolar disorder, presently  admitted because of abdominal pain and appears to have generalized  edema.  According to the patient, in the last 5-6 months, she has  recurrent edema, where her swelling has been increasing progressively.  She does not have any nausea or vomiting.  However, the patient states  that when she came, she was having some vomiting, she was having nausea.  Presently, her main problem is poor appetite and increasing leg edema  and some difficulty swelling.  She denies any history of renal failure  or history of kidney stone.  When a workup was done, the patient was  found to have severe hypoalbuminemia with anasarca and elevated BUN and  creatinine, and a consult is called.   PAST MEDICAL HISTORY:  1. She has a history of diabetes.  2. History of coronary artery disease status post stent placement.  3. History of hyperlipidemia.  4. History of hypertension.  5. History of anxiety disorder.  6. History of bipolar disorder.  7. History of diabetic gastropathy.  8. History of low back pain.  9. History of also peripheral vascular disease as stated above.  10.History of also anasarca.   MEDICATIONS:  1. Norvasc 5 mg p.o. daily.  2. Celexa 40 mg p.o. daily.  3. Furosemide 40 mg IV b.i.d.  4. Novolin insulin.  5. She is also  on Lantus insulin 80 units subcu.  6. Reglan 5 mg IV q.6 h.  7. Pamelor 25 mg p.o. daily.  8. Benicar 20 mg p.o. daily.  9. Protonix 40 mg p.o. daily.  10.Alprazolam 1 mg p.o. daily on a p.r.n. basis.  11.She is also getting some diuretics on a p.r.n. basis.   ALLERGIES:  She is allergic to CODEINE.  She is allergic to MORPHINE  SULFATE.  Allergic to CHLORPROMAZINE.  She is allergic also to  KETOROLAC.  She is allergic to DEMEROL.  She is allergic to NUBAIN.  She  is allergic also to TOPIRAMATE.   SOCIAL HISTORY:  No history of alcohol abuse.  She has history of  smoking for many years, about 40-pack-year history, but denies any use  of alcohol or drug use.   SURGICAL HISTORY:  She has history of spinal fusion, history of  cholecystectomy, and history of also stent placed.   REVIEW OF SYSTEMS:  When she came, she has history of nausea.  Presently, she does have any nausea or vomiting.  She  has had shortness  of breath and poor appetite.  She has also some shortness of breath.  No  chest pain, urgency, or frequency, but she complains of still severe  anasarca and no improvement.   PHYSICAL EXAMINATION:  GENERAL:  The patient is alert, sitting by  bedside.  VITAL SIGNS:  Her blood pressure is 164/69, temperature 97.8, pulse of  80, and respiratory rate 16.  HEENT:  No conjunctival pallor or scleral icterus.  Oral mucosa is pink.  CHEST:  She has some expiratory crackles.  Decreased breath sounds.  HEART:  Regular rate and rhythm with no murmur.  No S3.  ABDOMEN:  Positive bowel sounds.  Nontender.  No palpable  hepatosplenomegaly.  EXTREMITIES:  She has 1+ edema.   She has about 600 mL of urine, which was documented.  Her white blood  cell count is 8.2, hemoglobin 11.8, hematocrit 32.1, and platelets 354.  Her sodium is 141, potassium 3.3, chloride is 108, CO2 of 27, BUN is 7,  creatinine 2.  Her creatinine when she came was 1.91.  However,  creatinine has been normal.  She has  albumin of 1.8 in February.  Her  alk phosphatase is 172.  Her calcium is 7.9.  Her hemoglobin A1c in  December was 7, and her BNP is 480.  On 24-hour urine, she has about  6000 mg of protein and creatinine clearance is 75 mL per hour.   ASSESSMENT:  1. Renal insufficiency.  Possibly, at this moment seems to be chronic      in the presence of diabetes for more than 30 years.  History of      nephrotic-range proteinuria and history of diabetic retinopathy and      bilateral neuropathy.  The etiology for her renal insufficiency      probably most likely is secondary to diabetes.  Presently, even      though her creatinine seems to be low secondary to hemodilution,      creatinine clearance collected seems to be showing significant      kidney damage.  At this moment, the patient may be stage III      diabetic nephropathy.  2. Anasarca.  The patient has severe hypoalbuminemia.  3. History of nephrotic-range proteinuria.  The patient might have      nephrotic syndrome.  At this moment, she is on Benicar 20 mg p.o.      daily.  4. Hypertension.  Blood pressure seems to be somewhat high, but      according to the patient seems to be improving.  5. History of anxiety disorder.  6. History of low back pain, chronic problem.  7. History of diabetic gastropathy.  8. History of coronary artery disease status post stent placement.  9. History of diabetes.  The patient on insulin.  According to her,      insulin is now controlled as well.  10.History of hyperlipidemia.  This could be possibly a part of      nephrotic syndrome.  The patient is started on cholesterol-lowering      medication.  11.History of peripheral vascular disease.  She is on Plavix.  12.History of depression.  She is on Pamelor.  13.History of diabetic gastropathy.  She is on Reglan.   RECOMMENDATIONS:  We will increase her Lasix to 100 mg IV.  Probably, we  will add metolazone 2.5 mg p.o. b.i.d.  We will do 24-hour urine  for  protein and immunoelectrophoresis.  We  will do ultrasound of the  kidneys.  Her lab does not show any suspicion for renal artery stenosis.  Probably, we will use an ACE inhibitor.  We will follow the patient.  We  will check ANA complement, hepatitis B surface antigen, and hepatitis C  antibody.  We will put her on potassium 40 mEq p.o. daily.      Jorja Loa, M.D.  Electronically Signed     BB/MEDQ  D:  10/19/2008  T:  10/20/2008  Job:  [id number]

## 2011-01-09 NOTE — Discharge Summary (Signed)
NAMESHATERICA, MCCLATCHY              ACCOUNT NO.:  [id number]   MEDICAL RECORD NO.:  [id number]          PATIENT TYPE:  INP   LOCATION:  A307                          FACILITY:  [facility]   PHYSICIAN:  Melvyn Novas, MDDATE OF BIRTH:  1970-09-02   DATE OF ADMISSION:  10/16/2008  DATE OF DISCHARGE:  LH                               DISCHARGE SUMMARY   The patient is a 40 year old white female with a longstanding history of  severe insulin-dependent diabetes, coronary artery disease,  hyperlipidemia, hypertension, anxiety disorder with depression who has  chronic noncompliance and severe episodes of recurrent gastroparesis.  She was admitted to the hospital with nausea, vomiting, dehydration,  followed clinically, given clear liquid diet.  This revealed no  electrolyte abnormalities contributing to any GI dysfunction.  The  patient was continued on her Reglan and given Zofran IV.  While in the  hospital, it was noted that she had 3+ pitting edema, which is chronic  for which she usually wears compression stockings.  A 24-hour urine for  protein and creatinine was performed revealing high-grade proteinuria in  the 6 g per 24-hour range.  Her serum creatinine was 1.78 and her serum  proteins were diminished at 5.2 and her albumin was diminished at 1.8.  She was seen in consultation by Dr. Kristian Covey, nephrologist, to address  her nephrotic-range proteinuria in the face of insulin-dependent  diabetes.   She was subsequently discharged on her admission medicines which  included:  1. Accupril 40 mg p.o. daily.  2. Coreg 18.5 mg p.o. b.i.d.  3. Lantus insulin 100 units nightly.  4. Lipitor 40 mg p.o. daily.  5. Nexium 40 mg p.o. daily.  6. Nortriptyline 25 mg nightly.  7. Plavix 75 mg p.o. daily.  8. Reglan 20 mg p.o. b.i.d. p.r.n.  9. Vicodin 5/500 p.o. t.i.d. p.r.n.  10  Xanax 1 mg p.o. t.i.d. p.r.n.  1. Knee-high compression stockings, moderate pressure.   She will follow up in  the office in 1 week's time and a renal ultrasound  will be performed.      Melvyn Novas, MD  Electronically Signed     RMD/MEDQ  D:  10/18/2008  T:  10/19/2008  Job:  [id number]

## 2011-01-09 NOTE — Assessment & Plan Note (Signed)
[facility]                          [facility] OFFICE NOTE   NAME:[name], [name]                     MRN:          [id number]  DATE:12/19/2007                            DOB:          [birth year]    HISTORY OF PRESENT ILLNESS:  The patient is a 37-year female with a  history of coronary artery disease.  The patient was recently seen by  Dr. Antoine Poche and Gene Serpe for substernal chest pain and shortness of  breath.  A Cardiolite study was ordered.  The patient was found to have  equivocal LV perfusion with an elevated TID ratio.  The patient had an  ejection fraction of 49% and there were two small defects, both anterior  and inferior, consistent with ischemia.   The patient does state that she continues to have chest pain on  exertion.  This has no pain at rest.  Unfortunately, she continues to  smoke.  She feels extremely tired.  She also has developed severe lower  extremity leg edema.  Her blood pressure has been uncontrolled.   MEDICATIONS:  1. Xanax 1 mg q.i.d.  2. Trazodone 200 mg p.o. nightly.  3. Nexium 40 mg p.o. b.i.d.  4. Lipitor 40 mg p.o. nightly.  5. Seroquel 20 p.o. nightly.  6. Plavix 75 mg p.o. daily.  7. Nortriptyline.  8. Reglan.  9. Norco.  10.Zetia.  11.Coreg 18.75 mg b.i.d.  12.Accupril 40 mg p.o. nightly.  13.Lasix 40 mg p.o. daily.   PHYSICAL EXAMINATION:  VITAL SIGNS:  Blood pressure 171/100, heart rate  78 beats per minute, weight  is 222 pounds.  GENERAL:  Overweight white female in no distress.  HEENT:  Pupils isocoric.  Conjunctivae clear.  NECK:  Supple, normal carotid stroke, no carotid bruits.  LUNGS:  Clear breath sounds bilaterally.  HEART:  Regular rate and rhythm.  Normal S1, S2.  No murmurs, rubs or  gallops.  EXTREMITIES:  2-3+ peripheral pitting edema with weeping of the skin.  Peripheral pulses are intact.   PROBLEMS LIST:  1. Abnormal Cardiolite stress study.  2. Coronary artery  disease.      a.     Status post stenting in 2003 in [place].      b.     Nonobstructive coronary artery disease with minimal in-stent       restenosis of the diagonal by catheterization December 2006.  3. Mild left ventricular dysfunction, ejection fraction 49%.  4. Lower extremity edema, new, rule out congestive heart failure.  5. IDDM.  6. Uncontrolled hypertension.  7. Longstanding tobacco use.  8. 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   PLAN:  1. The patient will be referred for a diagnostic catheterization to      assess her coronary anatomy given her abnormal Cardiolite study and      her symptom complex.  2. The patient has a possible DYE allergy, although she states this      was only after they used old dye.  Nevertheless, the patient has      an IVP DYE allergy and I have pretreated her with prophylaxis.  3. The patient's lower extremity edema is somewhat puzzling but could      well be secondary to congestive heart failure.  The patient was      started on Lasix and showed minimal improvement.  Given the fact      that her creatinine is 1.109 and with a GFR of 59 mL./min., I have      opted not to increase her Lasix and to assess her hemodynamics      during catheterization and further optimize her treatment.  The      patient will need also renal angiography done to make sure that she      does not have renal artery stenosis as a cause for her worsening      hypertension.  4. I have given the patient a prescription for Isordil 20 mg p.o.      b.i.d. and I have stressed to her that she needs to be admitted to      the emergency room if she has chest pain at rest.     Learta Codding, MD,FACC  Electronically Signed    GED/MedQ  DD: 12/19/2007  DT: 12/19/2007  Job #: [id number]   cc:   Melvyn Novas, MD

## 2011-01-09 NOTE — Group Therapy Note (Signed)
[name], [name]              ACCOUNT NO.:  [id number]   MEDICAL RECORD NO.:  [id number]          PATIENT TYPE:  INP   LOCATION:  A338                          FACILITY:  [facility]   PHYSICIAN:  Edward L. Juanetta Gosling, M.D.DATE OF BIRTH:  Jan 01, 1971   DATE OF PROCEDURE:  DATE OF DISCHARGE:                                 PROGRESS NOTE   [name] is a patient that I was consulted on about Coumadin and  possibility of pulmonary embolism.  Since then, she has now developed  what looks like a pneumonia on chest x-ray, and she has a very large  abscess underneath her jaw.  She has had a temperature of 102, so I have  ordered blood cultures and started vancomycin in addition to her other  antibiotics.      Edward L. Juanetta Gosling, M.D.  Electronically Signed     ELH/MEDQ  D:  02/23/2009  T:  02/24/2009  Job:  [id number]

## 2011-01-09 NOTE — Group Therapy Note (Signed)
NAMELILIAS, LORENSEN              ACCOUNT NO.:  [id number]   MEDICAL RECORD NO.:  [id number]          PATIENT TYPE:  INP   LOCATION:  A338                          FACILITY:  [facility]   PHYSICIAN:  Edward L. Juanetta Gosling, M.D.DATE OF BIRTH:  May 10, 1971   DATE OF PROCEDURE:  DATE OF DISCHARGE:                                 PROGRESS NOTE   Ms. Camposano had her ultrasound done yesterday, and it was negative for  venous thrombosis, but I do not think that we cannot continue her  anticoagulation considering the multiple risk factors and her lung scan.  I would therefore continue with anticoagulation as previously started.      Edward L. Juanetta Gosling, M.D.  Electronically Signed     ELH/MEDQ  D:  02/22/2009  T:  02/23/2009  Job:  [id number]

## 2011-01-09 NOTE — Consult Note (Signed)
NAMEKORY, PANJWANI              ACCOUNT NO.:  [id number]   MEDICAL RECORD NO.:  [id number]          PATIENT TYPE:  INP   LOCATION:  A338                          FACILITY:  [facility]   PHYSICIAN:  Barbaraann Barthel, M.D. DATE OF BIRTH:  March 23, 1971   DATE OF CONSULTATION:  03/04/2009  DATE OF DISCHARGE:                                 CONSULTATION   NOTE:  Surgery was asked to see this 40 year old, white female, who has  been admitted to the medical service.  I have reviewed the chart and  examined the patient.  In essence, she has a complicated medical  history.  She is currently on Coumadin and Lovenox for she has had a  pulmonary embolus and pneumonia.  She is currently on 3 antibiotics  including vancomycin.  She presented last week with what appeared to be  their folliculitis or sebaceous cyst under her chin and cultures were  obtained and they revealed Staphylococcus by oral report.  She has some  induration in this area, where she squeeze this.  In essence, because of  this patient's anticoagulation status and the fact that this is a  growing cellulitis, I would continue warm compresses and continue the  vancomycin which she has been placed.  I will follow her.  This if we  can do a limited to I and D later on as this responded to the heat and  antibiotics.  I will consider doing this.  At present, I think a  nonoperative approach with the antibiotics and warm compresses would be  of benefit to begin with.  I will discuss this later with Dr. Janna Arch.  Consider ENT consult.      Barbaraann Barthel, M.D.  Electronically Signed     WB/MEDQ  D:  03/04/2009  T:  03/05/2009  Job:  [id number]   cc:   Melvyn Novas, MD  Fax: [phone]

## 2011-01-09 NOTE — Assessment & Plan Note (Signed)
OFFICE VISIT   [name], [name]  DOB:  Nov 08, 1970                                       01/25/2010  EAVWU#:[id number]   The patient is a 40 year old female who underwent placement of a left  forearm AV graft on 12/29/2009.  She was seen by Della Goo, [facility] on  May 20 for forearm swelling.  She returns today for further followup.  She is currently not on hemodialysis.  She states that the swelling has  improved significantly only she still has some itching in the skin over  the graft.   PHYSICAL EXAM:  Today blood pressure is 161/79 in the right arm, heart  rate is 80 and regular.  Temperature is 97.8.  Left forearm still has  some edema primarily over the area of the graft.  There is edema  extending from the elbow all the way down into the hand.  There is no  upper arm swelling.  There is an audible bruit within the graft.  There  is no real significant erythema at this point.  There is no drainage.   I believe most likely the patient has had a Gore-Tex reaction and this  should continue to resolve with time since currently her symptoms are  improving.  She is currently not on dialysis.  We will see her again in  one month's time to make sure that the edema continues to resolve.  She  has no symptoms of steal in her hand.     Janetta Hora. Fields, MD  Electronically Signed   CEF/MEDQ  D:  01/25/2010  T:  01/26/2010  Job:  [id number]   cc:   [facility]

## 2011-01-09 NOTE — Consult Note (Signed)
[name], [name]              ACCOUNT NO.:  [id number]   MEDICAL RECORD NO.:  [id number]          PATIENT TYPE:  INP   LOCATION:  A338                          FACILITY:  [facility]   PHYSICIAN:  R. Roetta Sessions, M.D. DATE OF BIRTH:  1971/01/27   DATE OF CONSULTATION:  DATE OF DISCHARGE:                                 CONSULTATION   REFERRING PHYSICIAN:  Melvyn Novas, MD.   PRIMARY GASTROENTEROLOGIST:  Lionel December, M.D.   REASON FOR CONSULTATION:  Acute on chronic anemia.   PRIMARY CARE PHYSICIAN:  Melvyn Novas, MD.   HISTORY OF PRESENT ILLNESS:  [name] is a pleasant 40-year-  old Caucasian female with numerous medical problems including brittle  longstanding juvenile onset diabetes mellitus, acute on chronic kidney  disease, coronary artery disease with ischemic cardiomyopathy status  post stenting last year, admitted to the hospital with abdominal and  chest pain and dyspnea on February 17, 2009.  Evaluation revealed evidence  of pulmonary embolus on the VQ scan.  She also has patchy pulmonary  infiltrates and has been febrile as well.  She was noted to be anemic on  admission.  Hemoglobin was down in the 7.7 range for which she was  transfused with 2 units of packed red blood cells.  She has not had any  melena, hematochezia or hematemesis.  She has been found be Hemoccult  negative x1.  Anemia profile demonstrated iron less than 10.  However,  ferritin was 67.  B12 level was lower limit of normal at 299, folate  level 4.9.   Ms. Fuller has a well-established diagnosis of gastroparesis and has  been on Reglan 10 mg before breakfast and at bedtime for years now  without apparent side effects.  She has longstanding chronic abdominal  pain and her historical baseline analgesic regimen includes Dilaudid 4  mg orally every 4 hours.  As I understand it the dose has been cut down  significantly recently by Dr. Janna Arch as an outpatient.  Her  LFTs and  lipase were normal on admission.  Her gallbladder is out.   She has been tolerating a regular diet over the past few days without  difficulty.  She continues to have chronic diffuse abdominal pain.  She  has not had any abdominal imaging this admission other than plain  abdominal films which revealed a nonspecific bowel gas pattern.   She has been seen by Dr. Kristian Covey and Dr. Juanetta Gosling during this admission  and Dr. Mariel Sleet has been asked to see this nice lady.   It is notable she is now on low-molecular-weight heparin, Coumadin along  with aspirin and Plavix.  Antibiotic regimen currently includes  vancomycin, ceftriaxone and Zithromax.  Analgesic regimen includes  hydromorphone 1 mg every 3  hours p.r.n. abdominal pain.  She tells me  Dr. Karilyn Cota performed an EGD on her lastly in the fall of 2009 without  any significant findings as she reports but the medical records are not  available for review at this time.  She reports never having  colonoscopy.  There is no family history  of inflammatory bowel disease  or GI neoplasia.   PAST MEDICAL HISTORY:  Significant for:  1. Longstanding diabetic gastroparesis.  2. Longstanding insulin dependent diabetes mellitus.  3. Coronary artery disease with MI and three stents placed previously.  4. Chronic kidney disease with exacerbation this admission.  5. Hypertension.  6. Hyperlipidemia.  7. Generalized anxiety disorder, bipolar.  8. Gastroesophageal reflux disease.   PAST SURGERIES:  1. Include surgery for hyperparathyroidism.  2. She is status post cholecystectomy.  3. Lumbosacral fusion previously.  4. Surgery for foreign body right knee.   MEDICATIONS ON ADMISSION:  1. Include lisinopril.  2. Potassium chloride.  3. Demadex.  4. Reglan.  5. Calcitriol.  6. Aspirin.  7. Coreg.  8. Lipitor.  9. Nexium 40 mg orally daily.  10.Phenergan 25 mg daily.  11.Xanax.  12.Compression stockings.  13.Vicodin.  14.Metformin.   15.Dilaudid.   FAMILY HISTORY:  As outlined above.  Mother has diabetes.   MEDICATION ALLERGIES CITED:  IODINE, COMPAZINE, CONTRAST MEDIUM,  MORPHINE, OXYCODONE, KETOROLAC, LOFEXIDINE, TOPAMAX.   SOCIAL HISTORY:  The patient lives with her mother who is very  supportive.  Smokes half pack of cigarettes per day.  No alcohol.  No  illicit drugs.  She is divorced.  No children.  Disabled and unemployed.   REVIEW OF SYSTEMS:  No odynophagia, no dysphagia, some early satiety and  nausea, but chronic abdominal pain.  No melena, hematochezia,  constipation, diarrhea.  She does not feel that her weight has changed  much recently.   PHYSICAL EXAMINATION:  This is an unwell appearing 39 year old lady  accompanied by her mother.  Temperature 98.2, BP 120/64, pulse 70.  SKIN:  Warm and dry.  There is no jaundice, no stigmata of chronic liver  disease.  HEENT EXAM:  No scleral icterus.  Conjunctivae are somewhat pale.  She  has fluctuance and apparent cellulitis with purulent drainage under her  chin in the submandibular region.  CHEST:  Lungs are clear to auscultation.  CARDIOVASCULAR:  Regular rate and rhythm without murmur, gallop, rub.  ABDOMEN:  Obese.  She has multiple insulin injection sites appreciated.  Positive bowel sounds, no bruits.  Mild diffuse tenderness but without  obvious mass or organomegaly.  No succussion splash.  She has brawny  lower extremity edema with both legs left being greater than right.   ADDITIONAL LABORATORY DATA:  From today:  Sodium 133, potassium 4.0,  chloride 104, CO2 is 19, glucose 314, BUN 36, creatinine 3.22, calcium  9.8, magnesium 1.9.  Protime today is 1.2.  CBC today:  White count 9.2,  H and H 9.9 and 27.9, MCV 91.2, platelet count 387.  Lipase on June 24  was 16, albumin 2.4, calcium 10.4, ALT 24, AST 12, alkaline phosphatase  141, total bilirubin 0.3.   IMPRESSION:  [name] is a pleasant 40 year old lady with  multiple medical  problems admitted to the hospital with pneumonia and a  pulmonary embolus.  She has significant coronary artery disease and has  an element of congestive heart failure as well.  She now displays acute  on chronic anemia, although clinically no obvious gastrointestinal  bleeding and she was found to be Hemoccult negative x1.  She has a very  low iron and also has a lower limit normal serum B12 level.  She has a  longstanding gastroparesis and chronic abdominal pain which seem to be  essentially unchanged at this time, although she perceives worsening of  abdominal pain  given this ratcheting back on her Dilaudid as an  outpatient.  A Hematology consultation is pending.  She has a history of  gastroesophageal reflux disease well-controlled on acid suppression  therapy.   As noted above, she is on significant antiplatelet and anticoagulation  therapy which certainly would place her at risk for bleeding.  She  appears to have a submandibular abscess.   RECOMMENDATIONS:  1. Continue Protonix as has already been instituted.  2. Follow her hemoglobin along as you are doing.  We will review the      Hemoccults as they become available.  3. We will go ahead and take the liberty of bumping back her Dilaudid      up to 2 mg every 3 hours to see if we can get better control of her      abdominal pain.  4. I do not recommend any further specific diagnostic studies for her      abdominal pain at this time.  5. We will check a celiac antibody panel while she is here.  6. We will make further recommendations in the very near future.   I would like to thank Dr. Janna Arch for allowing me to see this nice lady  today.      Jonathon Bellows, M.D.  Electronically Signed     RMR/MEDQ  D:  03/01/2009  T:  03/01/2009  Job:  [id number]   cc:   Lionel December, M.D.  Fax: [phone]   Melvyn Novas, MD  Fax: [phone]

## 2011-01-09 NOTE — Assessment & Plan Note (Signed)
OFFICE VISIT   [name], [name]  DOB:  [birth year]                                       10/31/2009  ZOXWR#:[id number]   Patient comes in today as an unscheduled visit for right arm pain.  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  It was closed with a vein patch on  February 12th.  She developed some serous drainage and inflammation  around the incision and had been putting Neosporin on it when Dr. Darrick Penna  saw her on March 2nd.  She is told to stop the Neosporin and to follow  up in 3 weeks.  Dr. Darrick Penna is considering new access on the left, as  this had healed.  Over the past day or 2, she has had some drainage and  increasing pain.  She is not having any fevers.   On examination, there is some serous drainage coming from the wound.  This was explored with a Q-tip.  The skin was opened.  There was no  tunnel or tract.  I did express a couple of drops of seropurulent fluid.  Again, there do not appear to be any gross contamination or infection.   I did pack this with gauze; however, there was much to pack.  I have  reassured her that this should improve.  She is going to keep her  regularly scheduled appointment with Dr. Darrick Penna in 2 weeks.  I did give  her 10 Vicodin.     Jorge Ny, MD  Electronically Signed   VWB/MEDQ  D:  10/31/2009  T:  11/01/2009  Job:  [id number]   cc:   Janetta Hora. Darrick Penna, MD

## 2011-01-09 NOTE — Assessment & Plan Note (Signed)
[facility]                          [facility] OFFICE NOTE   NAME:Standifer, LUCAS EXLINE                     MRN:          [id number]  DATE:12/01/2007                            DOB:          1970-12-29    PRIMARY CARDIOLOGIST:  Learta Codding, MD, [facility]   REASON FOR VISIT:  Ms. Bazar is a 40 year old female, with history of  coronary artery disease, who was last seen here in our clinic in June  2007, by Dr. Lewayne Bunting. She now presents with complaint of progressive  exertional shortness of breath and intermittent chest discomfort, and  was recently seen by Dr. Delbert Harness.  She refers to having had a 2-D  echo, which he ordered, and which was suggestive of a thickened heart  muscle.  We currently do not have the results of this study, which was  completed approximately three weeks ago.   The patient's cardiac history is notable for a reported heart attack  in 2003, at which time she underwent placement of two stents.  This was  done in [place].  Our records, however, consist of a diagnostic  coronary angiogram, performed by Dr. Antoine Poche in December 2006, for  evaluation of chest pain.  This showed nonobstructive CAD with 30% in-  stent restenosis of the proximal first diagonal.  Left ventricular  function was normal (EF 65%).   The patient has numerous cardiac risk factors, notable for insulin-  dependent diabetes mellitus, hypertension, dyslipidemia, family history,  and longstanding tobacco smoking.   The patient is also plagued by severe gastroparesis, requiring  hospitalization on numerous occasions.  She also refers to progressive  development of worsening bilateral lower extremity edema.  She also  complains of orthopnea and occasional PND, as well as exertional  dyspnea.   Electrocardiogram today reveals NSR 100 bpm with left axis deviation and  no acute changes.   MEDICATIONS:  1. Coreg 12.5 b.i.d.  2. Nexium 40 b.i.d.  3. Lipitor  40 daily.  4. Seroquel 200 nightly.  5. Plavix.  6. Nortriptyline 25 daily.  7. Reglan. 15 mg q.i.d.  8. Accupril 20 daily.  9. Norco 10/325  four times daily.  10.Zetia.  11.Xanax 1 mg q.i.d.  12.Trazodone 200 mg nightly.   PHYSICAL EXAM:  Blood pressure 204/115 initially, with a repeat 196/117.  Pulse 101, regular; weight 224, sats 97% room air.  GENERAL: A 40 year old female, morbidly obese, sitting upright in no  distress.  HEENT: Normocephalic, atraumatic.  NECK: Palpable bilateral carotid pulses without bruits; jugular venous  distention noted at 90 degrees.  LUNGS:  Clear to auscultation in all fields. Marland Kitchen  HEART: Regular rate and rhythm (S1, S2). Positive S4. No significant  murmurs.  ABDOMEN: Protuberant, nontender, faint bowel sounds.  EXTREMITIES: 4+ bilateral pitting edema.  NEURO: No focal deficit.   IMPRESSION:  1. Coronary artery disease.      a.     Status post stenting in 2003 [place]).      b.     Nonobstructive coronary artery disease with minimal in-stent       restenosis of DX1,  by cardiac catheterization, December 2006.  2. History of normal left ventricular function.  3. New onset bilateral lower extremity edema.  4. Atypical chest pain.  5. Insulin-dependent diabetes mellitus.  6. Uncontrolled hypertension.  7. Dyslipidemia.  8. Longstanding tobacco.  9. Obesity.   PLAN:  Following review with Dr. Antoine Poche, recommendations are as  follows:  1. Medication adjustment with up titration of Coreg to 18.75 b.i.d.,      Accupril up to 40 mg daily, and addition of Lasix 40 mg daily for      aggressive treatment of lower extremity edema/volume overload.  2. Recommend early followup blood work with a BMET/BNP level later      this week, at time of scheduled follow-up with Dr. Delbert Harness in      [place].  3. Schedule adenosine stress Cardiolite for risk stratification.  If      this is suggestive of ischemia, then consideration will be given to      a  repeat cardiac catheterization.  4. Request recent 2-D echo results from Dr. Janeece Fitting office.  5. Schedule early clinic followup with myself and Dr. Andee Lineman in 1      month, for review of study results and additional recommendations.      Gene Serpe, PA-C  Electronically Signed      Rollene Rotunda, MD, [facility]  Electronically Signed   GS/MedQ  DD: 12/01/2007  DT: 12/01/2007  Job #: [id number]   cc:   Melvyn Novas, MD

## 2011-01-09 NOTE — Discharge Summary (Signed)
[name], MULKA              ACCOUNT NO.:  [id number]   MEDICAL RECORD NO.:  [id number]          PATIENT TYPE:  INP   LOCATION:  A338                          FACILITY:  [facility]   PHYSICIAN:  Melvyn Novas, MDDATE OF BIRTH:  1970-10-02   DATE OF ADMISSION:  01/23/2007  DATE OF DISCHARGE:  LH                               DISCHARGE SUMMARY   The patient is a 40 year old white female with severe recurrent  abdominal pain, severe gastroparesis, insulin-dependent diabetes,  hypertension, hyperlipidemia, coronary artery disease status post  stenting x2, with chronic cephalgia, depression, and anxiety.  The  patient had recurrent emesis, some diffuse abdominal pain, cramping, and  some mild diarrhea.  She was subsequently admitted due to volume  depletion clinically.  She was found to be hypokalemic.  She was placed  on intravenous fluids and given intravenous potassium.  This was  repleted.  Her emesis stopped over a 2-day period.  BMET revealed nearly  normal function with a normal potassium and renal function.  Her abdomen  diffuse tenderness sort of resolved, and she was subsequently discharged  on the fourth hospital day.   DISCHARGE MEDICATIONS:  Same as the previous medicines, including:  1. Accupril 20.  2. Lipitor 40.  3. Aspirin 1 a day.  4. Plavix 75 a day.  5. Avandamet 4/500 b.i.d.  6. Lantus 100 units q.h.s.  7. Sliding scale NovoLog q.a.c. and q.h.s. insulin as ordered.   She will be followed up in the office in 2 days' time.      Melvyn Novas, MD  Electronically Signed     RMD/MEDQ  D:  01/26/2007  T:  01/26/2007  Job:  [id number]

## 2011-01-09 NOTE — Assessment & Plan Note (Signed)
OFFICE VISIT   [name], [name]  DOB:  [birth year]                                       10/19/2009  [id number]#:[id number]   The patient returns for followup today.  She had ligation of her right  brachiocephalic AV fistula and repair of a brachial pseudoaneurysm with  a vein patch on October 08, 2009.   On physical exam today she has a 1+ right radial pulse.  Hand is pink,  warm and well-perfused.  The incision is healing well.  Half the sutures  were removed today.  There was still some inflammation so I did not  remove all of them today.  She will follow up next week for remainder of  suture removal.   She was given a prescription today, renewal for Vicodin, #20 dispensed.  We will not give her any further refills, and this was discussed with  the patient today.  After her wound on the right arm is completely  healed, we will need to consider placing a new access on the left side.     Janetta Hora. Fields, MD  Electronically Signed   CEF/MEDQ  D:  10/19/2009  T:  10/20/2009  Job:  [id number]

## 2011-01-09 NOTE — Group Therapy Note (Signed)
NAMEEVALUNA, UTKE              ACCOUNT NO.:  [id number]   MEDICAL RECORD NO.:  [id number]          PATIENT TYPE:  INP   LOCATION:  A338                          FACILITY:  [facility]   PHYSICIAN:  Edward L. Juanetta Gosling, M.D.DATE OF BIRTH:  [birth year]   DATE OF PROCEDURE:  02/25/2009  DATE OF DISCHARGE:                                 PROGRESS NOTE   The patient of Dr. Janna Arch.   [name] has had more problems with shortness of breath.  Her O2 sat  had dropped while she was on nasal cannula.  She does look more short of  breath.  Her temperature is 98, pulse 106, respirations 16-20, blood  pressure 151/78, O2 sats 84% on 2 L.  I bumped her up to a mask and she  seems a little better now.  I asked her to get a chest x-ray as well and  she still shows asymmetrical abnormalities on chest x-ray.  She has had  a great deal of fluid removed with intravenous diuretics, and I am  hopeful that with more intravenous diuretics, she will have continued  reduction in her volume overload.  Dr. Janna Arch is yet to see her this  morning, and I will discuss with him as well.      Edward L. Juanetta Gosling, M.D.  Electronically Signed     ELH/MEDQ  D:  02/25/2009  T:  02/26/2009  Job:  [id number]

## 2011-01-09 NOTE — Discharge Summary (Signed)
NAMEMARIELI, [name]              ACCOUNT NO.:  [id number]   MEDICAL RECORD NO.:  [id number]          PATIENT TYPE:  INP   LOCATION:  A338                          [facility]ACILITY:  [facility]   PHYSICIAN:  Melvyn Novas, MDDATE OF BIRTH:  July 10, 1971   DATE OF ADMISSION:  02/17/2009  DATE OF DISCHARGE:  LH                               DISCHARGE SUMMARY   ADDENDUM:  The patient has multiple and various medical problems.  Since  the last discharge summary, she has had an abscess in the submandibular  area, which grew out MRSA by culture and is currently being treated with  vancomycin IV with slow resolution.  She is also attempting to be fully  anticoagulated on Coumadin while fully anticoagulated on Lovenox.  There  is some resistance to Coumadin at present, not sure why.  Concern is  compliance, whether the patient is swallowing pills; however, after  giving 12.5 mg of Coumadin for the last 3 days, her INR is still  significantly suboptimal at 1.6.  We will discharge her on Coumadin 10  mg per day as I feel this is most judicious, and we will continue  Lovenox 100 mg subcu q.12 h to be administered and supervised by home  health care.  In addition, she will be discharged on Reglan 5 mg #120  given 1 p.o. q.i.d. p.r.n. for gastroparesis and Zofran 4 mg #120 given  1 p.o. q.4 h p.r.n. nausea.  Additional antihypertensives since the last  discharge summary include clonidine 0.3 mg p.o. b.i.d. #60 given, folic  acid 1 mg p.o. daily, potassium chloride 40 mEq #60 one p.o. b.i.d.,  Coumadin 10 mg p.o. daily to obtain a target INR between 2 and 3, and  then cessation of Lovenox once that is achieved as well as Lovenox 100  mg subcu q.12 h until INR achieves stated goal on Coumadin.  She will  also continue vancomycin 1 gram IV q.48 h to be administered by home  health care with trial levels and according to protocol.  She will be  observed for signs of GI bleed, chronic noncompliance, and she  is to  administer her insulin, which is 100 units of Lantus h.s. as well as  Humalog 10 units a.c. t.i.d.      Melvyn Novas, MD  Electronically Signed     RMD/MEDQ  D:  03/10/2009  T:  03/10/2009  Job:  [id number]

## 2011-01-09 NOTE — Assessment & Plan Note (Signed)
OFFICE VISIT   [name], [name]  DOB:  [birth year]                                       03/07/2010  [id number]#:[id number]   The date of surgery was 12/29/2009 consisting of insertion of left  forearm AV graft.   Patient returns to clinic today complaining of itching over the site of  her AV graft.   PHYSICAL FINDINGS:  Heart rate is 81, blood pressure 181/87, O2 sat is  89%.  [name] was well-nourished in no distress.  HEENT:  PERRLA, EOMI.  Lungs were clear.  Cardiac exam revealed a regular rate and rhythm.  [name]  did have peripheral edema in her lower extremities and in her left hand.  Musculoskeletal exam demonstrated no major deformities.  Of note, [name]  was in a wheelchair.  Attention was turned to her left forearm AV graft.  The incision was well-healed.  There was no evidence of redness,  irritation, or infection.   It was suggested that [name] take Benadryl for itching.  It was also  suggested that [name] continue to observe the area, and if it worsened, to  return to clinic.   Wilmon Arms, PA   Quita Skye Hart Rochester, M.D.  Electronically Signed   KEL/MEDQ  D:  03/07/2010  T:  03/07/2010  Job:  [id number]

## 2011-01-09 NOTE — Discharge Summary (Signed)
NAMEKISMET, FACEMIRE              ACCOUNT NO.:  [id number]   MEDICAL RECORD NO.:  [id number]          PATIENT TYPE:  INP   LOCATION:  A337                          [facility]ACILITY:  [facility]   PHYSICIAN:  Melvyn Novas, MDDATE OF BIRTH:  September 30, 1970   DATE OF ADMISSION:  03/24/2009  DATE OF DISCHARGE:  08/05/2010LH                               DISCHARGE SUMMARY   The patient is a 40 year old white female with extensive medical  problems including chronic noncompliance personality disorder,  depression, anxiety, bipolar disorder, insulin-dependent diabetes,  chronic renal failure, nephrotic-range proteinuria, iron-deficiency  anemia, borderline folic acid deficiency, and possible anemia from  chronic renal failure, hypertension, COPD, recently documented pulmonary  emboli by perfusion scan, coronary artery disease, three-vessel disease  with normal LV function, anemia, which is multifactorial, status post  lumbosacral fusion, chronic back pain, chronic abdominal pain secondary  to diabetic gastroparesis, hyperlipidemia, hypertension, who was  admitted with left lower legs effusion and tenderness.  A clinical DVT  picture and venous Doppler revealed hemodynamic findings and visual  findings consistent with deep venous thrombosis of the left common  femoral vein.  She also had an ordinary amount of thigh pain and  effusion with headache.  CAT scan done of her pelvis and abdomen, which  revealed a hematoma of the left anterior thigh compartment, which the  counter for some of her left leg pain and swelling.  When reviewing the  CAT scan along with the venous Doppler with the radiologist, it was  found that the extent of the DVT was not as dire and has alluded to, and  there was no clinical or radiographic stigmata of iliac vein thrombosis,  consideration was given to the [place] filter placement, but due to  her no clinical evidence of pulmonary embolism, which is recurrent, was  felt  to hold off and continue anticoagulation.  She was anticoagulated  with Lovenox and then Coumadin.  Hypercoagulable panel was done on  Lovenox and Coumadin, which revealed no definite evidence of  hypercoagulable state.  Although results were not totally valid to  protein C and S and antithrombin III due to anticoagulation on board.  Finally her PT/INR became therapeutic on a regimen of Coumadin 10 mg per  day.  She was seen in consultation by Hematology.  Her Lovenox was  discontinued and she was subsequently discharged on home health care on  the following regimen of:  1. Coumadin 10 mg p.o. daily with daily protime INRs.  2. Nortriptyline 25 mg p.o. h.s.  3. Plavix 75 mg p.o. daily.  4. Xanax 1 mg p.o. t.i.d. p.r.n.  5. Celexa 40 mg p.o. daily.  6. Imdur 30 mg p.o. daily.  7. Reglan 10 mg before meals b.i.d. p.r.n.  8. Diovan 320 mg p.o. daily.  9. Aspirin 81 mg p.o. daily.  10.Coumadin 10 mg p.o. daily.  11.Lantus insulin 45 units to 80 units h.s. daily.  12.Lortab 7.5/500 p.o. q.i.d. #40 given.  13.Lasix 40 mg p.o. daily.  14.Metolazone 5 mg p.o. b.i.d.  15.Nexium 40 mg p.o. daily.  16.Coreg 12.5 mg p.o. b.i.d.  17.Lipitor  40 mg p.o. daily.  18.Rocaltrol 0.25 mcg p.o. daily.  19.Metformin 500 mg p.o. b.i.d.  20.Norvasc 10 mg p.o. daily.  21.Lisinopril 40 mg p.o. daily.  22.Clonidine 0.1 mg p.o. b.i.d.  23.Folic acid 1 mg p.o. daily.  24.New iron 150 mg p.o. b.i.d.  25.Potassium chloride 40 mEq p.o. daily.   The patient was discharged home health care with daily protime INR to be  done and to be very concerned for that she is chronically sabotaging  therapy and noncompliance and be certain that she was following her  pills, especially the Coumadin.      Melvyn Novas, MD  Electronically Signed     RMD/MEDQ  D:  03/30/2009  T:  03/31/2009  Job:  [id number] [id number]

## 2011-01-09 NOTE — H&P (Signed)
[name], [name]              ACCOUNT NO.:  [id number]   MEDICAL RECORD NO.:  [id number]          PATIENT TYPE:  INP   LOCATION:  A332                          FACILITY:  [facility]   PHYSICIAN:  Melvyn Novas, MDDATE OF BIRTH:  27-Jun-1971   DATE OF ADMISSION:  03/24/2009  DATE OF DISCHARGE:  LH                              HISTORY & PHYSICAL   HISTORY OF PRESENT ILLNESS:  The patient is a 40 year old white female  with extensive multisystemic medical disease, who was recently  discharged from hospital 2 weeks ago with a PE, documented by perfusion  scan with a negative venous Doppler.  It was felt that this might be an  older PE, but however, there were 2 areas on perfusion scan and she was  anticoagulated.  Her chronic medical problems consist of insulin-  dependent diabetes, chronic noncompliance, personality disorder,  nephrotic range proteinuria, a generalized anasarca, hypertension,  hyperlipidemia, significant three-vessel coronary artery disease, status  post stenting x2, bipolar disorder, hyperlipidemia, and hypertension.  The patient was sent home on Lovenox, full anticoagulation dosage, and  Coumadin was ordered for 8 days prior to discharge from the hospital.  Her Coumadin was bumped up to 10 mg per day and finally 12.5 mg per day.  However, her INR remained between 1.2-1.5.  After initial laboratory  response in the first 3 days, consideration was given to the patient not  swallowing pills.  She has been in home and her Coumadin was increased  via the home health care nurse well on Lovenox two 15 mg per day for the  previous 4 days.  She continues to have an INR of 1.2, despite being  given Lovenox also to cover the leg.  While 2 days ago, she reported to  the ER and was found to have extensive left lower leg suffusion and some  discomfort there.  Venous Doppler was obtained 2 days ago revealing  extensive stigmata of deep venous thrombosis, possibly to include  iliac  disease, which was not well seen.  The patient denies any sharp  pleuritic pain, any major increasing dyspnea, cough, sputum, or  hemoptysis.  She has recently stopped smoking.  The patient was admitted  for clinical DVT of her left leg, although it was dictated as the right  leg mistakenly by Radiology, to be continued on Coumadin 15 mg p.o.  daily with monitoring of coagulation status and continuation of Lovenox.  I will order a hypercoagulable panel, my memory believes this was done  and it was essentially unrevealing in the past, but I cannot find in the  computer.  She also has a hemoglobin of 6.1, which is down.  She  received multiple transfusions in her  previous hospitalizations. She  has a multifactorial anemia, which is borderline, low folate deficiency,  iron-deficiency less than 10 and presumably anemia from end-stage renal  disease.  However, she is not a candidate for Procrit as they will not  administer this if the iron is not repleted on laboratory analysis, sort  of a Catch 22 situation.  The patient will also be admitted and  seen by  Hematology to discuss the possible causes for this lack of response to  my opinion, negative doses of Coumadin.   PAST MEDICAL HISTORY:  Significant for the aforementioned pulmonary  embolism documented by perfusion scan, chronic renal failure, insulin-  dependent diabetes, nephrotic syndrome, chronic gastroparesis, chronic  bronchitis, hypertension, three-vessel coronary artery disease, status  post stenting x2, hyperlipidemia, bipolar disorder, generalized anxiety  disorder, dysthymia, and chronic noncompliance.   PAST SURGICAL HISTORY:  Remarkable for status post cholecystectomy and  status post fusion of the lumbosacral vertebra after laminectomy.  She  also had a parathyroid excision due to hyperparathyroidism.   CURRENT MEDICATIONS:  1. Coumadin 15 mg p.o. daily for period of 5 days prior to admission.  2. Potassium  chloride 40 mEq p.o. b.i.d.  3. Nu-iron 150 mg p.o. b.i.d.  4. Folic acid 1 mg p.o. daily.  5. Clonidine 0.2 mg p.o. b.i.d.  6. Lisinopril 40 mg p.o. daily.  7. Norvasc 10 mg p.o. daily.  8. Metformin 500 mg p.o. b.i.d.  9. Rocaltrol 0.25 mg p.o. daily.  10.Lipitor 40 mg p.o. daily.  11.Nortriptyline 25 mg p.o. h.s.  12.Plavix 75 mg p.o. daily.  13.Xanax 1 mg p.o. t.i.d.  14.Lovenox full dose anticoagulation per pharmacy protocol.   PHYSICAL EXAMINATION:  VITAL SIGNS:  Blood pressure of 152/81,  respirations of 18, pulse of 101, and temperature 97.8.  Her O2 sat is  100% on 2 L nasal O2.  HEENT:  Eyes, PERRLA.  Extraocular movements intact.  Sclerae clear.  Conjunctivae pale.  Throat shows no erythema or exudate. NECK:  No JVD.  no carotid bruits, no thyromegaly, no thyroid bruits.  LUNGS:  Prolonged expiratory phase, scattered rhonchi.  No rales or  wheeze audible at present.  HEART:  Regular rhythm, 1/6 aortic outflow murmur.  No S3, S4, or  gallops.  No heaves, thrills, or rubs.  ABDOMEN:  Soft and nontender.  Bowel sounds normoactive.  No guarding,  rebound, or hepatosplenomegaly.  EXTREMITIES:  Left leg shows 4+ edema.  Diffuse tenderness of the  posterior thigh and popliteal area.  No real erythema.  Homans sign is  equivocally a positive left leg, right leg shows 2+ pitting edema much  less than the left leg.  NEUROLOGIC:  Cranial nerves II through grossly intact.  Her affect is  blunted.  The patient moves all 4 extremities.  Plantars are downgoing.   IMPRESSION:  1. Clinical extensive deep venous thrombosis new with the last 3 weeks      despite the anticoagulation with full dose of Lovenox and 15 mg of      Coumadin per day.  2. Question of noncompliance since the start of treatment.  3. Known worsening anemia with hemoglobin of 6.1 despite transfusions      previously.      a.     Status post pulmonary embolism several weeks ago documented       by perfusion  scan.  4. Question of Coumadin resistance.  5. Question of hypercoagulable state.  6. Chronic renal failure, nephrotic range proteinuria.  7. Insulin-dependent diabetes.  8. Chronic diabetic gastroparesis.  9. Hypertension.  10.Coronary artery disease, three-vessel disease with 3 stents.  11.Hyperlipidemia.  12.Bipolar disorder.  13.Generalized anxiety disorder.  14.Dysthymia.  15.Chronic noncompliance.   PLAN:  To admit, continue full dose Lovenox, continue Coumadin 50 mg a  day, get hypercoagulable panel despite being on these anticoagulation  medicines, get a PTT, type and crossmatch for 4  units, transfuse 2 units  currently, continue all previous mentioned medicines and Hematology  consult to consider etiologies of anticoagulation resistance with  Coumadin as well as worsening multifactorial anemia.      Melvyn Novas, MD  Electronically Signed     RMD/MEDQ  D:  03/24/2009  T:  03/25/2009  Job:  [id number]

## 2011-01-09 NOTE — Discharge Summary (Signed)
[name], [name]              ACCOUNT NO.:  [id number]   MEDICAL RECORD NO.:  [id number]          PATIENT TYPE:  INP   LOCATION:  A329                          FACILITY:  [facility]   PHYSICIAN:  Melvyn Novas, MDDATE OF BIRTH:  02/15/1971   DATE OF ADMISSION:  06/14/2008  DATE OF DISCHARGE:  10/21/2009LH                               DISCHARGE SUMMARY   HISTORY:  The patient is a 40 year old white female with chronic  multiple medical and psychiatric problems including bipolar disorder,  depression, anxiety, insulin-dependent diabetes, chronic pain  complaints, hyperlipidemia, coronary artery disease, and hypertension.  The patient was admitted with diffuse lower abdominal pain, which is  chronic and recurring in nature.  Amylase and lipase was normal.  Electrolytes were essentially normal except for hyperglycemia.  This was  controlled with a.c. and nightly glucose and NovoLog sliding scale as  well as 100 units of Lantus insulin at night.  Her abdominal pain  subsided.  CAT scan of her abdomen was essentially normal.  Amylase and  lipase were normal.  Liver function tests were normal.  She was  subsequently discharged on the 5th hospital day on the following  regimen:  1. Accupril 40 mg daily.  2. Coreg 18.5 b.i.d.  3. Lipitor 40 mg daily.  4. Nexium 40 daily.  5. Amitriptyline 25 nightly.  6. Plavix 75 daily.  7. Reglan 20 mg b.i.d.  8. Xanax 1 mg q.i.d.  9. Metformin 500 mg b.i.d.  10.Lantus insulin 100 mg nightly.  11.Phenergan 25 mg b.i.d.  12.Nitroglycerin spray sublingual p.r.n.  13.Trazodone 300 mg nightly.  14.Zetia 10 mg per day.   She will follow up in the office in 3 days time.      Melvyn Novas, MD  Electronically Signed     RMD/MEDQ  D:  06/16/2008  T:  06/17/2008  Job:  [id number]

## 2011-01-09 NOTE — H&P (Signed)
[name], [name]              ACCOUNT NO.:  [id number]   MEDICAL RECORD NO.:  [id number]         PATIENT TYPE:  PINP   LOCATION:  A307                          FACILITY:  [facility]   PHYSICIAN:  Edward L. Juanetta Gosling, M.D.DATE OF BIRTH:  August 03, 1971   DATE OF ADMISSION:  DATE OF DISCHARGE:  LH                              HISTORY & PHYSICAL   REASON FOR ADMISSION:  Hypertension, abdominal pain, diffuse swelling.   HISTORY:  [name] is a 40 year old who has had multiple medical  problems and who has been in and out of the hospital on multiple  occasions in the last several years.  She has come to the emergency room  because her blood pressure was up, but apparently she has not been able  to keep any of her medications down because of nausea and vomiting.   Her medical, she has had significant problems with nausea and vomiting  on multiple occasions in the past.  She has had gastroparesis.  She  denies any chest discomfort, but her blood pressure has been up.  She  has had a lot of swelling of the extremities and apparently that is  being worked up, but no definite cause has been found as yet.   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   Surgically she has had a cholecystectomy, cardiac stents, and a spinal  fusion.   MEDICATIONS:  Her medications apparently are:  1. Accupril 40 mg daily.  2. Coreg 18.5 mg b.i.d.  3. Humalog on a sliding scale.  4. Lantus 100 units at bedtime.  5. Lipitor 40 mg daily.  6. Nexium 40 mg b.i.d.  7. Nortriptyline 25 mg at bedtime.  8. Phenergan 25 mg as needed.  9. Plavix 75 mg daily.  10.Reglan 20 mg four times a day.  11.Vicodin 5/500 as needed.  12.Xanax 1 mg four times a day.  13.Fioricet as needed for headaches.  14.Celexa 40 mg daily.  15.Metformin 5 mg b.i.d.  16.Dilaudid 4 mg as needed.  17.Nitroglycerin as needed for chest pain.  18.Norvasc 5 mg daily.   FAMILY HISTORY:  Her mother does have diabetes.   SOCIAL HISTORY:  She has about a 40-pack-year smoking history.  She has  been disabled.  She does not use any alcohol and does not use any  medications that are not prescribed for her.   REVIEW OF SYSTEMS:  Except as mentioned is negative.   PHYSICAL EXAMINATION:  VITAL SIGNS:  Temperature is 98.1, pulse is 92,  respirations are 18, O2 sats 91% on room air.  Her blood pressure 187/98  and then 188/103.  HEENT:  Her pupils are reactive.  Her nose and throat are clear.  Her  mucous membranes are dry.  NECK:  Supple without masses.  CHEST:  Fairly clear now.  HEART:  Regular without murmur, gallop, or rub.  ABDOMEN:  Somewhat distended and mildly tender.  EXTREMITIES:  She has 2+ edema of  both legs and has a little bit of  edema that looks like at her arms.   LABORATORY DATA:  CBC shows, white count 8100, hemoglobin 11.8,  platelets 354, metabolic profile shows a potassium is low at 2.9,  glucose 334, BUN 19, creatinine 1.91, and her BNP is 785.  She has been  said to have ejection fraction of 49% at her last cardiology visit.  We  do not have an echocardiogram recently from here.  I wonder if she has  developed more problems with cardiac dysfunction and my plan then would  be to go ahead and get her started on medications to try to get her  blood pressure under control.  Keep her pain controlled.  See if we can  do anything with her gastroparesis which of course is being treated, it  is going to be a difficult thing to continue with that Dr. Delbert Harness  will assume her care in the morning.      Edward L. Juanetta Gosling, M.D.  Electronically Signed     ELH/MEDQ  D:  10/16/2008  T:  10/17/2008  Job:  [id number]

## 2011-01-09 NOTE — Discharge Summary (Signed)
[name], [name]              ACCOUNT NO.:  [id number]   MEDICAL RECORD NO.:  [id number]          PATIENT TYPE:  INP   LOCATION:  A307                          FACILITY:  [facility]   PHYSICIAN:  Melvyn Novas, MDDATE OF BIRTH:  [birth year]   DATE OF ADMISSION:  04/03/2008  DATE OF DISCHARGE:  LH                               DISCHARGE SUMMARY   The patient is a 40 year old white female with multiple extensive  medical issues including chronic insulin-dependent diabetes, chronic  pain syndrome, coronary artery disease status post stenting x2, diabetic  gastroparesis, bipolar disorder, depression, anxiety, hyperlipidemia,  and recurrent UTIs.  Basically, the patient was admitted with recurrent  retching, nausea, and vomiting, was found to have glucoses in the 500s,  given IV fluids, consideration given to DKA, CO2 was normal.  She has  not had any chest pain, orthopnea, or PND.  She had abdominal pain.  Pain responded to intravenous Dilaudid 1 mg.  She subsided her retching  and vomiting with a combination of Zofran and then Phenergan  subsequently.  There were no acute surgical clinical findings in her  abdomen, and she was subsequently discharged on the third or fourth  hospital day.  To continue her current medicines of Accupril 40 mg per  day, Coreg 18.5 mg b.i.d., Lipitor 40 mg a day, Nexium 40 mg b.i.d.,  Plavix 75 mg daily, Zetia 10 mg per day, Seroquel 200 mg nightly, Xanax  1 mg q.i.d., Lantus insulin 100 mg nightly, Humalog sliding scale 10  units a.c. t.i.d., and metformin 500 b.i.d.  She will follow up in the  office in 1 day's time.      Melvyn Novas, MD  Electronically Signed     RMD/MEDQ  D:  04/06/2008  T:  04/07/2008  Job:  [id number]

## 2011-01-09 NOTE — H&P (Signed)
[name], [name]              ACCOUNT NO.:  [id number]   MEDICAL RECORD NO.:  [id number]          PATIENT TYPE:  INP   LOCATION:  A312                          FACILITY:  [facility]   PHYSICIAN:  Melvyn Novas, MDDATE OF BIRTH:  1971/03/31   DATE OF ADMISSION:  04/23/2009  DATE OF DISCHARGE:  LH                              HISTORY & PHYSICAL   This is a 40 year old white female with extensive medical problems  including  1. Presumed noncompliance.  2. Diabetes.  3. Personality disorder.  4. Status post pulmonary embolism with deep venous thrombosis.  5. Anticoagulation, which is subtherapeutic on Coumadin and Lovenox      despite 17.5 mg of Coumadin daily under supervision of home health      care nurses.  6. Nephrotic syndrome.  7. Chronic renal failure.  8. Diabetic nephrosclerosis.  9. Hypertension.  10.Hyperlipidemia.  11.Coronary artery disease status post stenting.   The patient complains of diffuse epigastric pain, which is chronic and  recurrent usually attributable to gastroparesis.  The patient states she  cannot keep her Coumadin down as of today, while she has been taking it  every other day, although she is very subtherapeutic with PT/INR of 1.3.  The patient denies anginal chest pain, orthopnea, or PND.  She has left  leg effusion and has been seen by home health care nurse every day since  discharged several weeks ago.  Consideration was given to Coumadin  resistance versus noncompliance.  Consideration given to giving IV  Coumadin to see hematologic response.  After discussion with mother, the  last admission.  The patient denies hematemesis, melena, or hematochezia  and will be admitted for  intravenous volume repletion, dehydration,  ability to take p.o. medicines and chronic gastroparesis.   PAST MEDICAL HISTORY:  Significant for the aforementioned hypertension,  diabetes insulin-dependent, hyperlipidemia, coronary artery disease  status post  stenting x2, chronic noncompliance, personality disorder,  bipolar disorder, nephrotic syndrome, status post pulmonary embolism.  Anticoagulation with Lovenox and Coumadin is subtherapeutic, depression,  GERD, gastroparesis and mild-to-chronic renal failure.   PAST SURGICAL HISTORY:  Remarkable for stenting x2, coronary artery  disease, appendectomy, and vaginal hysterectomy.   ALLERGIES:  She has no known allergies.   CURRENT MEDICINES:  Current medicines are legion  1. Renvela 800 mg q.i.d.  2. Torsemide 100 mg p.o. daily.  3. Folic acid 1 mg p.o. daily.  4. Calcitriol 0.25 mg p.o. daily.  5. Imdur 30 mg p.o. daily.  6. Nexium 40 mg p.o. daily.  7. Metformin 500 mg p.o. b.i.d.  8. Metolazone 5 mg p.o. daily.  9. Nortriptyline 25 mg p.o. at bedtime.  10.Diovan 320/12.5 p.o. daily.  11.Lisinopril 40 mg p.o. daily.  12.Norvasc 10 mg p.o. daily.  13.Celexa 40 mg p.o. daily.  14.Coumadin 17.5 mg p.o. daily for last 5 days, 3 weeks prior to this      was 15 mg p.o. daily.  15.Lovenox 100 mg subcu b.i.d.  16.Clonidine 0.3 mg p.o. b.i.d.   She has question one time, which has been no true allergies to  Compazine, Demerol, morphine,  Nubain, and Toradol.   PHYSICAL EXAMINATION:  VITAL SIGNS:  Blood pressure at present 184/102,  temperature 97.1, respiratory rate is 20, and O2 saturations is 97%.  HEENT:  Eyes, PERRLA.  Extraocular movement intact.  Sclerae clear.  Conjunctivae pink.  NECK:  Shows no JVD, no carotid bruits.  No thyromegaly or thyroid  bruits.  LUNGS:  Shows prolonged respiratory phase.  Scattered rhonchi.  No rales  or wheeze appreciable.  HEART:  Regular rate and rhythm.  No murmurs, gallops, heaves, thrills,  or rubs.  ABDOMEN:  Soft and nontender.  Bowel sounds are normoactive.  No  guarding, rebound, or hepatosplenomegaly.  EXTREMITIES:  Left  lower extremity 3-4+ chronic edema.  Right lower  extremity 2-3+ chronic edema.  Homans sign is negative.   NEUROLOGIC:  Cranial nerves II through XII grossly intact.  The patient  moves all 4 extremities.  Plantars are downgoing.   IMPRESSION:  As follows.  1. Recurrent abdominal pain, presumably due to gastroparesis, nausea,      vomiting.  2. Intravascular volume depletion.  3. Inability to take p.o. medicines.   PLAN:  To admit, as per orders.      Melvyn Novas, MD  Electronically Signed     RMD/MEDQ  D:  04/23/2009  T:  04/24/2009  Job:  [id number]

## 2011-01-09 NOTE — H&P (Signed)
NAMEELIZABTH, [name]              ACCOUNT NO.:  [id number]   MEDICAL RECORD NO.:  [id number]          PATIENT TYPE:  INP   LOCATION:  A338                          FACILITY:  [facility]   PHYSICIAN:  Melvyn Novas, MDDATE OF BIRTH:  1971-02-26   DATE OF ADMISSION:  01/23/2007  DATE OF DISCHARGE:  LH                              HISTORY & PHYSICAL   The patient is a 40 year old white female with multiple and extensive  medical problems including longstanding insulin-dependent diabetes,  severe gastric paresis, recurrent admissions for abdominal pain,  vomiting, hypertension, coronary artery disease, and hyperlipidemia.  The patient experienced a recurrent episode of emesis.  She denied any  coffee grounds, any melena, hematemesis, or hematochezia.  Her tongue  appeared dry.  She had some cramping abdominal pain.  She denied any  diarrhea or hematochezia.  The patient was subsequently admitted to the  hospital for volume depletion, given IV fluids, monitoring of  electrolytes and glucose, and administration of intravenous antiemetics.  She denied any anginal pain, orthopnea, or PND.  She does have a cough  which is nonproductive.   PAST MEDICAL HISTORY:  Significant for insulin-dependent diabetes,  severe gastric paresis, recurrent, unexplained abdominal pain,  hypertension, hyperlipidemia, coronary artery disease, recurrent  vaginitis.   PAST SURGICAL HISTORY:  Remarkable for cholecystectomy, laminectomy of  the lumbosacral spine x3 with a subsequent fusion of LS spine.  Coronary  stenting x2.   ALLERGIES:  SHE HAS NO KNOWN TRUE ALLERGIES.  MULTIPLE INTOLERANCES TO  VARIOUS MEDICINES.   CURRENT MEDICATIONS:  1. Lantus 120 mg at bedtime.  2. Humalog sliding scale a.c. and at bedtime.  3. Accupril 20.  4. Avandamet 4/500 p.o. b.i.d.  5. Xanax 1 mg q.i.d.  6. Coreg 6.25 q.12 h.  7. Lipitor 40 mg per day.  8. Plavix 75 mg per day.  9. Aspirin 81 mg stat.  10.Reglan 20  mg p.o. a.c. and at bedtime.  11.She was also placed on Norco 10 mg q.i.d. for lower back pain by      neurologist.   PHYSICAL EXAMINATION:  VITAL SIGNS:  Blood pressure is 143/72.  Pulse is  100 and regular.  She is afebrile.  Respiratory rate is 20.  HEENT:  Head normocephalic and atraumatic.  Eyes:  PERRLA.  Extraocular  movements intact.  Sclerae clear.  Conjunctivae pink.  Neck shows no  JVD.  No carotid bruits.  No thyromegaly.  Tongue is dry.  Mucosa:  No  erythema noted.  LUNGS:  Show right lower lobe crepitations.  Scattered rhonchi in the  right lobe.  Otherwise all lung fields show prolonged expiratory phase  without rales or wheeze.  HEART:  Regular rhythm.  No murmurs, gallops, heaves, thrills, or rubs.  ABDOMEN:  Soft.  Bowel sounds are normal active.  No borborygmi.  No  guarding, rebound, or masses.  No megaly.  EXTREMITIES:  Trace to 1+ pedal edema which is chronic.  NEUROLOGIC:  Cranial nerves 2 through 12 are grossly intact.  The  patient moves all four extremities.  Plantars are downgoing.   IMPRESSION:  1.  Recurrent abdominal pain, etiology undetermined with multiple      extensive workups which were negative.  2. Volume depletion.  3. Severe gastroparesis.  4. Insulin-dependent diabetes.  5. Hypertension.  6. Coronary artery disease, stenting x2.  7. Hyperlipidemia.  8. Depression.  9. Anxiety.   PLAN:  The plan is to admit, IV fluids, add Phenergan 12.5 IV q.4 h.  p.r.n., monitor electrolytes, sliding scale, volume repletion, and I  will make further recommendations as the data base expands.      Melvyn Novas, MD  Electronically Signed     RMD/MEDQ  D:  01/24/2007  T:  01/24/2007  Job:  [id number]

## 2011-01-09 NOTE — Assessment & Plan Note (Signed)
OFFICE VISIT   MAYTAL, MIJANGOS  DOB:  1971/04/21                                       11/16/2009  FAOZH#:[id number]   Patient returns for follow-up today.  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  This required her fistula to be ligated.  This was done in February of  2011.  She has had difficulty healing up her antecubital incision and  returns today for further follow-up.   On physical exam today, the antecubital incision still has some erythema  around it.  There is no obvious abscess.  The erythema is only  surrounding the skin edge and seems to primarily irritation.  There is  an easily palpable brachial pulse.  There is easily palpable radial  pulse.  She does still have some swelling in this area, so we did a  duplex ultrasound today to rule out any recurrence of pseudoaneurysm.  The artery looked intact with no evidence of pseudoaneurysm, no  hematoma.  There was no evidence of fluid collection.   Overall, I believe patient is continuing to heal.  She will follow up in  a few more weeks.  Hopefully her incision will be healed.  At that time  we would consider placing an access on the contralateral arm.     Janetta Hora. Fields, MD  Electronically Signed   CEF/MEDQ  D:  11/16/2009  T:  11/17/2009  Job:  [id number]

## 2011-01-09 NOTE — Consult Note (Signed)
[name], [name]              ACCOUNT NO.:  [id number]   MEDICAL RECORD NO.:  [id number]          PATIENT TYPE:  INP   LOCATION:  A338                          FACILITY:  [facility]   PHYSICIAN:  Ladona Horns. Neijstrom, MD  DATE OF BIRTH:  1970/11/21   DATE OF CONSULTATION:  03/08/2009  DATE OF DISCHARGE:                                 CONSULTATION   DIAGNOSES:  1. Multifactorial anemia.  2. Diabetes mellitus with poor compliance.  3. Methicillin-resistant Staphylococcus aureus infection underneath      the chin.  4. Diabetic gastroparesis.  5. Coronary artery disease, status post 3-4 stents placed in the past.  6. History of nephrotic range proteinuria.  7. Chronic renal insufficiency.  8. Hypertension.  9. Hyperlipidemia.  10.Bipolar disorder.  11.Gastroesophageal reflux disease.  12.Chronic low back pain, status post lumbar sacral fusion and      laminectomy, it sounds like.  13.History of cholecystectomy.  14.History of secondary hyperparathyroidism.   HISTORY:  This lady was admitted as mentioned-above on June 25.  At that  time, she was found to be anemic.  Her blood counts at that time showed  a white count 11,500, hemoglobin 11.1 g, MCV of 90.6, platelets of  298,000.  D-dimer was minimally elevated 1.12.  Her CMET showed a BUN of  30, creatinine 2.66.  Her sugar was 292.  On admission, her albumin is  2.4, calcium 10.4.  Lipase was normal.  Her beta-natriuretic peptide was  509.  She had PT of 13.8 seconds on June 28.  Her CBC at that time  showed a hemoglobin 9.5, MCV once again normal at 91.  Platelets were  normal.  White count was normal.  Differential showed a mild left shift.   It was determined that she might well have a possible pulmonary embolus.  She had a perfusion scan suggestive of possible pulmonary emboli.  She  had Doppler studies of both lower extremities as well.  This was on June  28.  She had no evidence of DVT.  There was some subcutaneous edema  suggesting elevated right heart pressures.   Chest exam on 29th revealed that she had a small left effusion and a  possible pneumonia.   She states that she was admitted because of nausea and vomiting and  dehydration.   Her impressions per Dr. Janna Arch were recurrent abdominal pain, diabetic  gastroparesis, insulin-dependent diabetes, coronary artery disease,  chronic renal failure, and so we admitted her, gave her clear liquids,  bowel rest, IV analgesia etc.   While here, she had some other blood work including a celiac disease  antibody test which was negative and she had some anemia studies which  showed a reticulocyte count of 72,800 that was an absolute reticulocyte  count that is within the normal range, but a serum iron was very very  low less than 10 and TIBC was not even calculable.  Interestingly, her  folic acid level was borderline at 4.9 which is in the indeterminate  range.  B12 level was 219.  Ferritin was 67.   She has been started on  iron as well as folic acid.   As of July 4, her hemoglobin had fallen to 7.7.  Platelets and white  count remained in the normal range.  She was then transfused, and I was  asked to see her in consultation.   It is a note that not only she had been on Lovenox, she is also now on  Coumadin.  She was on aspirin and Plavix prior to admission.   She is also on antibiotic.   SOCIAL HISTORY:  She has been married and divorced twice.  She states  that she had 2 spontaneous miscarriages.  She has 3 half-sisters to her  father that she does not know.  She has 1 half-brother and 2 half-  sisters to her mother.  She lives with her mother presently.  She is the  youngest of her mother's children she states.  She states her half-  brother is an alcoholic.  She states her one-half sister through her  mother is a drug addict.   Her father committed suicide when she was less than 39 years old and her  mother has diabetes as well as it sounds  like COPD.   [name] states that she smoked 2-3 packs a day until about 3 weeks ago,  when she was told that she might have lung disease and she states that  she wants to give it up at this time.   She states that her biggest problem causing admission intermittently are  nausea and vomiting, but she also was short of breath on this admission.   She states that she also has history of congestive heart failure.   She states she has never had a blood clot before in her legs or in her  chest.   There is no family history of this.   She leads a very sedentary existence.  She has not worked in years and  is on disability.   She also sees Dr. Kristian Covey.   She does not drink and she states that she does not use drugs.   She has a ninth grade education dropped out of school because of health  reasons.   She does not have sore tongue.  She likes to eat ice, but only  occasionally.   Her exam shows that she is 40 years old.  She looks older than her  stated age.  Her height is recorded as 71 inches.  Her weight is 103.4  kg.   She is presently afebrile.  Blood pressure of 152/87, respirations 18-22  and unlabored.  Pulse rate around 80 and regular.  She is not in any  acute distress.  She does have decreased breath sounds, but otherwise  her lungs are clear.  I did not hear a rub.  I did not hear any rales.  She has no obvious adenopathy in the cervical, supraclavicular,  infraclavicular, axillary, or inguinal areas.  I did not feel  hepatosplenomegaly.  She has scars on her abdomen.  She also has  ecchymoses, especially in the right lower abdomen, where she has had  Lovenox injections and she also has a boil underneath her chin.  She  also has areas that she is picked that of bleeding on her low back and  one close to the left upper back.  Her left foot at the left-sided toes  are also bleeding and her left leg is edematous, but it is more  subcutaneous.  Does not pain, but it is  several  centimeters larger than  the right leg which has no edema that I can appreciate.  Pulses in her  feet are diminished.  Her abdomen shows no bowel sounds presently.  Again, she has scars where she has had previous infection, she states  are boils.   Breast exam was deferred.  She does have a poor in the right upper chest  wall.  She has no teeth.  Here teeth were pulled.  She states 2 years  ago.  Tongue looks unremarkable.  Pupils appear equally round and  reactive to light.  She is alert and very oriented.   This lady clearly has a multifactorial anemia with diabetes and renal  insufficiency causing anemia of chronic disease picture.  She may be  marginally folic acid deficient and she looks like she is very clear-cut  iron deficient.  The ferritin is falsely elevated at this time due to  her inflammatory process plus or minus the PE.   I think the pulmonary embolus is suggestive, but not necessarily 100%,  and I am not sure, I will treat her with anything more than 6 months of  Coumadin and then see how she does perhaps explored hypercoagulable  panel at that time.   I will see her back in the clinic in about 8 weeks to see if she needs  any IV iron and it is possibly, she might because she is on Plavix,  aspirin, and will also be on Coumadin, it sounds like when she goes  home, so will see her back.      Ladona Horns. Mariel Sleet, MD  Electronically Signed     ESN/MEDQ  D:  03/08/2009  T:  03/09/2009  Job:  [id number]   cc:   Melvyn Novas, MD  Fax: [phone]   Barbaraann Barthel, M.D.  Fax: [phone]   Jorja Loa, M.D.  Fax: [phone]

## 2011-01-11 ENCOUNTER — Other Ambulatory Visit (HOSPITAL_COMMUNITY): Payer: Self-pay | Admitting: Nephrology

## 2011-01-11 DIAGNOSIS — T8249XA Other complication of vascular dialysis catheter, initial encounter: Secondary | ICD-10-CM

## 2011-01-12 ENCOUNTER — Ambulatory Visit (HOSPITAL_COMMUNITY)
Admission: RE | Admit: 2011-01-12 | Discharge: 2011-01-12 | Disposition: A | Discharge status: Home / Self Care | Payer: Medicare Other | Source: Ambulatory Visit | Attending: Nephrology | Admitting: Nephrology

## 2011-01-12 ENCOUNTER — Other Ambulatory Visit (HOSPITAL_COMMUNITY): Payer: Self-pay | Admitting: Nephrology

## 2011-01-12 DIAGNOSIS — T8249XA Other complication of vascular dialysis catheter, initial encounter: Secondary | ICD-10-CM

## 2011-01-12 DIAGNOSIS — Z452 Encounter for adjustment and management of vascular access device: Secondary | ICD-10-CM | POA: Insufficient documentation

## 2011-01-12 DIAGNOSIS — N19 Unspecified kidney failure: Secondary | ICD-10-CM | POA: Insufficient documentation

## 2011-01-12 LAB — POCT I-STAT 4, (NA,K, GLUC, HGB,HCT)
Glucose, Bld: 466 mg/dL — ABNORMAL HIGH (ref 70–99)
HCT: 38 % (ref 36.0–46.0)
Hemoglobin: 12.9 g/dL (ref 12.0–15.0)
Potassium: 4.9 mEq/L (ref 3.5–5.1)
Sodium: 127 mEq/L — ABNORMAL LOW (ref 135–145)

## 2011-01-12 LAB — GLUCOSE, CAPILLARY

## 2011-01-12 NOTE — Procedures (Signed)
[name], [name]              ACCOUNT NO.:  [id number]   MEDICAL RECORD NO.:  [id number]          PATIENT TYPE:  INP   LOCATION:  A336                          FACILITY:  [facility]   PHYSICIAN:  Vida Roller, M.D.   DATE OF BIRTH:  11-13-1970   DATE OF PROCEDURE:  12/12/2004  DATE OF DISCHARGE:                                  ECHOCARDIOGRAM   PRIMARY CARE PHYSICIAN:  Hanley Hays. Josefine Class, M.D.   Tape Number [id number]; tape count 0 through 423.   This is a 40 year old woman with chest discomfort, has known coronary artery  disease, hypertension, and diabetes.   The technical quality of the study is adequate.   M-MODE TRACINGS:  The aorta is 28 mm.   The left atrium is 46 mm.   The septum is 13 mm.   The posterior wall is 11 mm.   Left ventricular diastolic dimension is 49 mm.   Left ventricular systolic dimension is 34 mm.   2-D AND DOPPLER IMAGING:  The left ventricle is normal size with normal  systolic function.  Estimated ejection fraction 55 to 60%. There were no  significant wall motion abnormalities.  There is mild concentric left  ventricular hypertrophy.   The right ventricle is normal size with normal systolic function.   The left atrium is enlarged.  The right atrium appears to be normal size.   The aortic valve is sclerotic with no stenosis or regurgitation.   The mitral valve is morphologically unremarkable with no stenosis or  regurgitation.   The tricuspid valve is morphologically unremarkable with trivial  insufficiency.  No stenosis is seen.   The pulmonic valve is not well seen.   The inferior vena cava was not well seen.   The ascending aorta was not well seen.   There is a prominence of fat around the pericardium.  There was no obvious  pericardial effusion.      JH/MEDQ  D:  12/12/2004  T:  12/12/2004  Job:  [id number]

## 2011-01-12 NOTE — Procedures (Signed)
[name], [name]              ACCOUNT NO.:  [id number]   MEDICAL RECORD NO.:  [id number]          PATIENT TYPE:  INP   LOCATION:  A309                          FACILITY:  [facility]   PHYSICIAN:  Edward L. Juanetta Gosling, M.D.DATE OF BIRTH:  06-22-1971   DATE OF PROCEDURE:  04/15/2006  DATE OF DISCHARGE:                                EKG INTERPRETATION   Time 10:54, April 15, 2006.  The rhythm is a tachycardia rhythm.  There are  least two and possibly three different P-wave morphologies.  There is right  atrial enlargement.  There is possible left atrial enlargement.  Possibly  lateral ST-T wave abnormalities, and this may indicate that she has  multifocal atrial tachycardia.  Clinical correlation is suggested.      Edward L. Juanetta Gosling, M.D.  Electronically Signed     ELH/MEDQ  D:  04/16/2006  T:  04/16/2006  Job:  [id number]

## 2011-01-19 ENCOUNTER — Ambulatory Visit: Payer: Medicare Other | Admitting: Vascular Surgery

## 2011-01-24 ENCOUNTER — Encounter (HOSPITAL_COMMUNITY): Payer: Medicare Other

## 2011-01-24 ENCOUNTER — Ambulatory Visit (INDEPENDENT_AMBULATORY_CARE_PROVIDER_SITE_OTHER): Payer: Medicare Other | Admitting: Vascular Surgery

## 2011-01-24 DIAGNOSIS — N186 End stage renal disease: Secondary | ICD-10-CM

## 2011-01-25 ENCOUNTER — Ambulatory Visit: Payer: Medicare Other | Admitting: Vascular Surgery

## 2011-01-25 NOTE — Assessment & Plan Note (Signed)
OFFICE VISIT  NYEMA, HACHEY B DOB:  01/15/1971                                       01/24/2011 HQION#:[id number]  I saw this patient in the office  today to evaluate her for new access. She previously had a right brachiocephalic fistula, which failed.  Most recently she had a left forearm AV graft placed, which has failed on multiple occasions.  Most recently she had a fistulogram after thrombolysis which demonstrated diffuse disease throughout her outflow vein and therefore interventional radiology placed a catheter.  She was sent for evaluation for new access.  She dialyzes Tuesdays, Thursdays and Saturdays.  Of note, she has had no recent uremic symptoms.  Specifically, she denies nausea, vomiting, palpitations, fatigue, anorexia or significant shortness of breath.  REVIEW OF SYSTEMS:  CARDIOVASCULAR:  She had no chest pain, chest pressure, palpitations or arrhythmias. PULMONARY:  She had no productive cough, bronchitis, asthma or wheezing.  PAST MEDICAL HISTORY:  Significant for type 1 diabetes, history of diabetic gastroparesis, chronic kidney disease stage 5, hypertension, coronary disease.  She is status post previous PTCA in 2003.  She also has history of COPD.  In addition, she has a history of depressive disorder.  PHYSICAL EXAMINATION:  This is a pleasant 40 year old woman who appears stated age.  Blood pressure is 184/87, heart rate is 94.  Lungs:  Clear bilaterally to auscultation.  Cardiovascular:  She has a regular rate and rhythm.  She has a palpable brachial pulse bilaterally with diminished radial pulse.  I have reviewed her fistulogram which shows diffuse disease throughout the upper arm brachial vein.  The axillary vein is bifid but the larger of the two branches appears reasonable to attempt the graft.  She had a previous vein map of the right arm.  The forearm cephalic vein was too small for fistula.  She had an occluded  upper arm brachiocephalic fistula.  Therefore the only remaining option for a fistula would be a basilic vein transposition on the right.  I did interrogate with the duplex scanner in the office today and the basilic vein on the right does not appear to be adequate size for a fistula.  For this reason, I think her best option for access is a new left upper arm AV graft.  Will schedule this for non-dialysis day and this is scheduled for February 02, 2011.  Will stop her Plavix 1 week preoperatively.    Di Kindle. Edilia Bo, M.D. Electronically Signed  CSD/MEDQ  D:  01/24/2011  T:  01/25/2011  Job:  [id number]  cc:   Jorja Loa, M.D.

## 2011-02-02 ENCOUNTER — Ambulatory Visit (HOSPITAL_COMMUNITY)
Admission: RE | Admit: 2011-02-02 | Discharge: 2011-02-02 | Disposition: A | Discharge status: Home / Self Care | Payer: Medicare Other | Source: Ambulatory Visit | Attending: Vascular Surgery | Admitting: Vascular Surgery

## 2011-02-02 ENCOUNTER — Ambulatory Visit (HOSPITAL_COMMUNITY): Payer: Medicare Other

## 2011-02-02 DIAGNOSIS — N186 End stage renal disease: Secondary | ICD-10-CM

## 2011-02-02 DIAGNOSIS — N185 Chronic kidney disease, stage 5: Secondary | ICD-10-CM | POA: Insufficient documentation

## 2011-02-02 DIAGNOSIS — Z01818 Encounter for other preprocedural examination: Secondary | ICD-10-CM | POA: Insufficient documentation

## 2011-02-02 DIAGNOSIS — I120 Hypertensive chronic kidney disease with stage 5 chronic kidney disease or end stage renal disease: Secondary | ICD-10-CM

## 2011-02-02 LAB — POCT I-STAT 4, (NA,K, GLUC, HGB,HCT)
Glucose, Bld: 264 mg/dL — ABNORMAL HIGH (ref 70–99)
HCT: 38 % (ref 36.0–46.0)
Hemoglobin: 12.9 g/dL (ref 12.0–15.0)

## 2011-02-02 LAB — SURGICAL PCR SCREEN: Staphylococcus aureus: NEGATIVE

## 2011-02-02 LAB — GLUCOSE, CAPILLARY: Glucose-Capillary: 149 mg/dL — ABNORMAL HIGH (ref 70–99)

## 2011-02-02 IMAGING — CR DG FOOT COMPLETE 3+V*R*
3 series · 3 of 3 positions shown · non-contrast
Comparison: None.

CLINICAL DATA: Foot injury.  Pain and laceration.

RIGHT FOOT COMPLETE - 3+ VIEW

[view not recorded (1 of 3)]
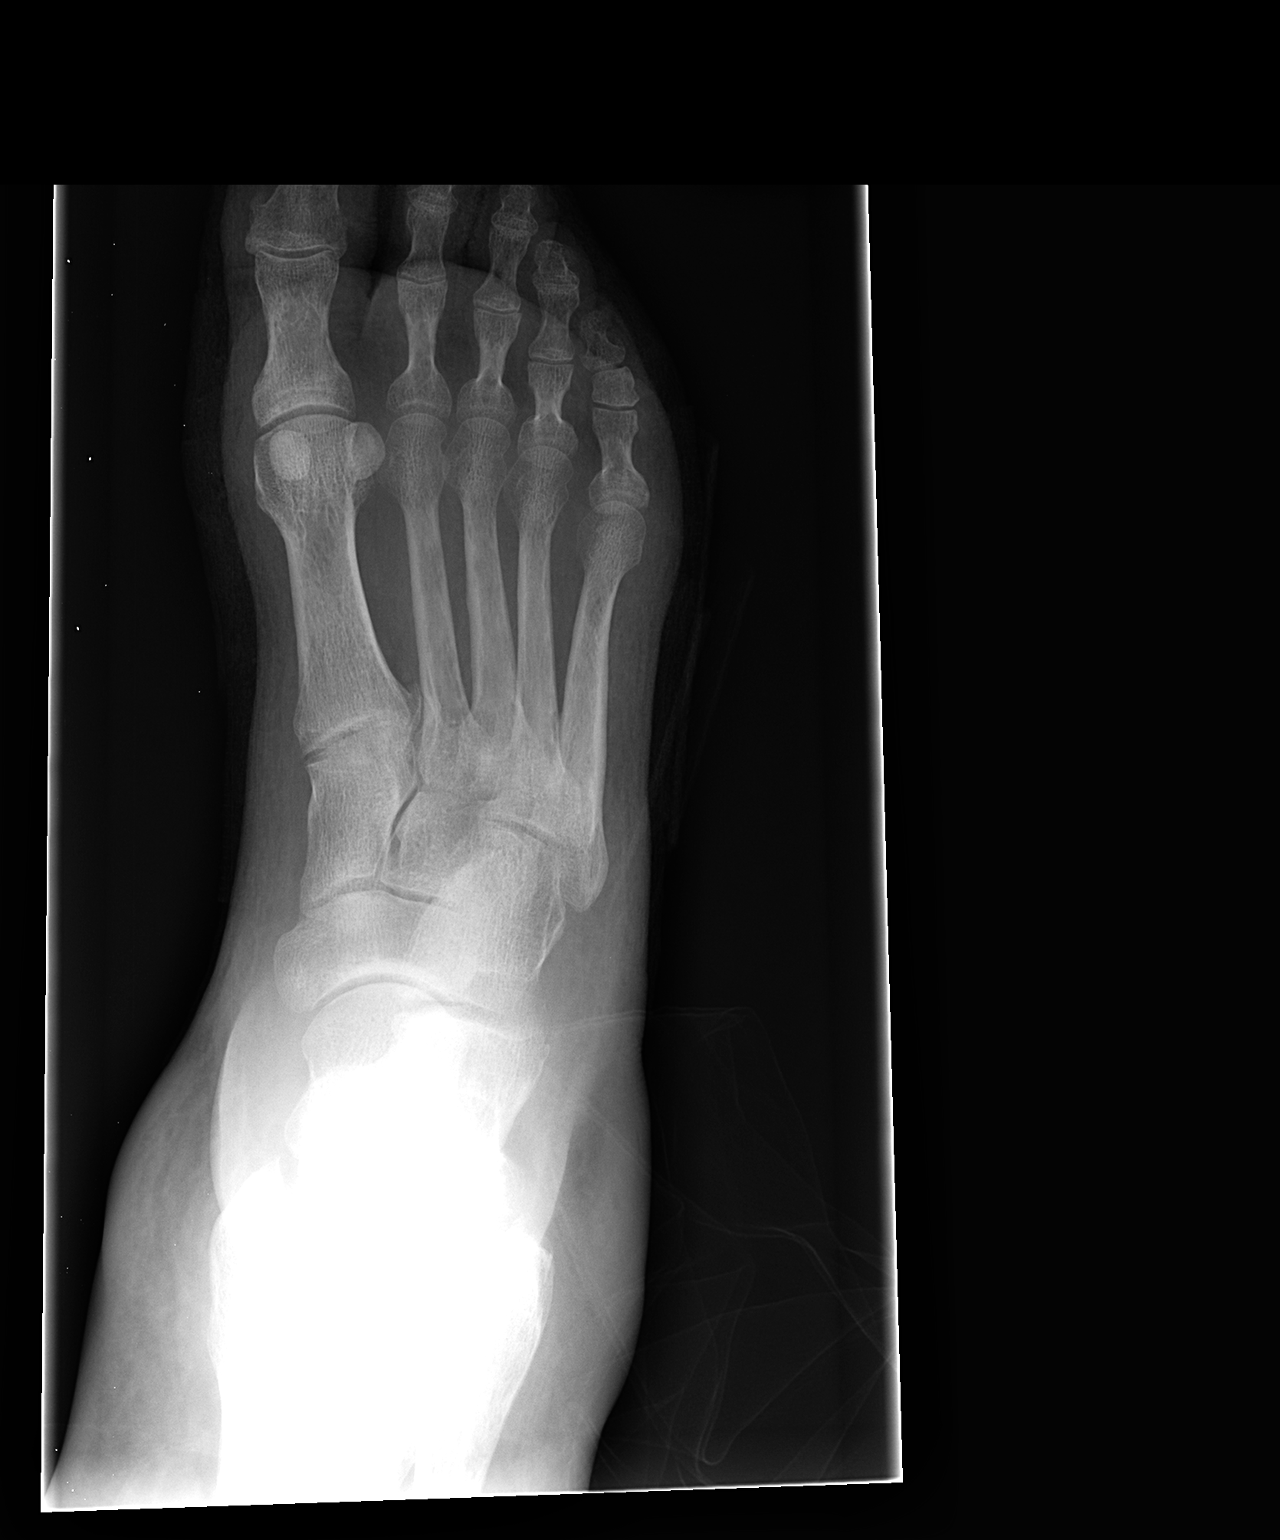

[view not recorded (2 of 3)]
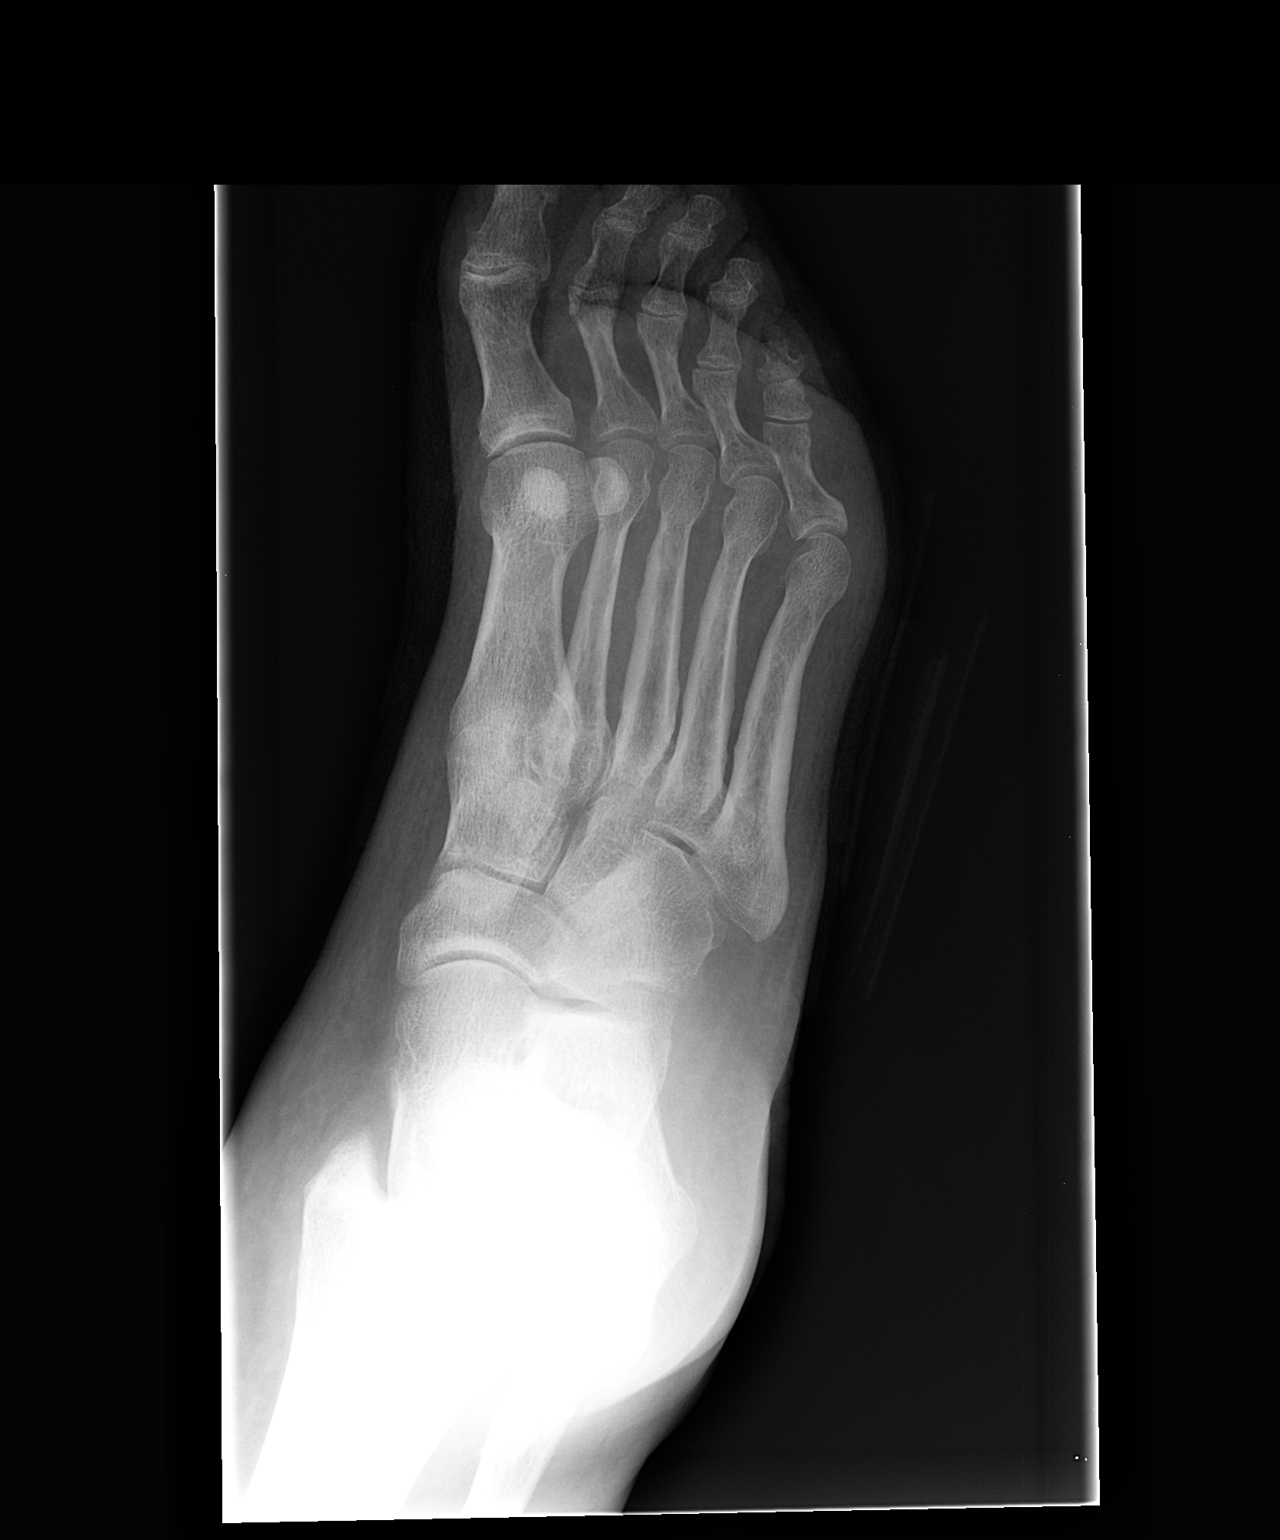

[view not recorded (3 of 3)]
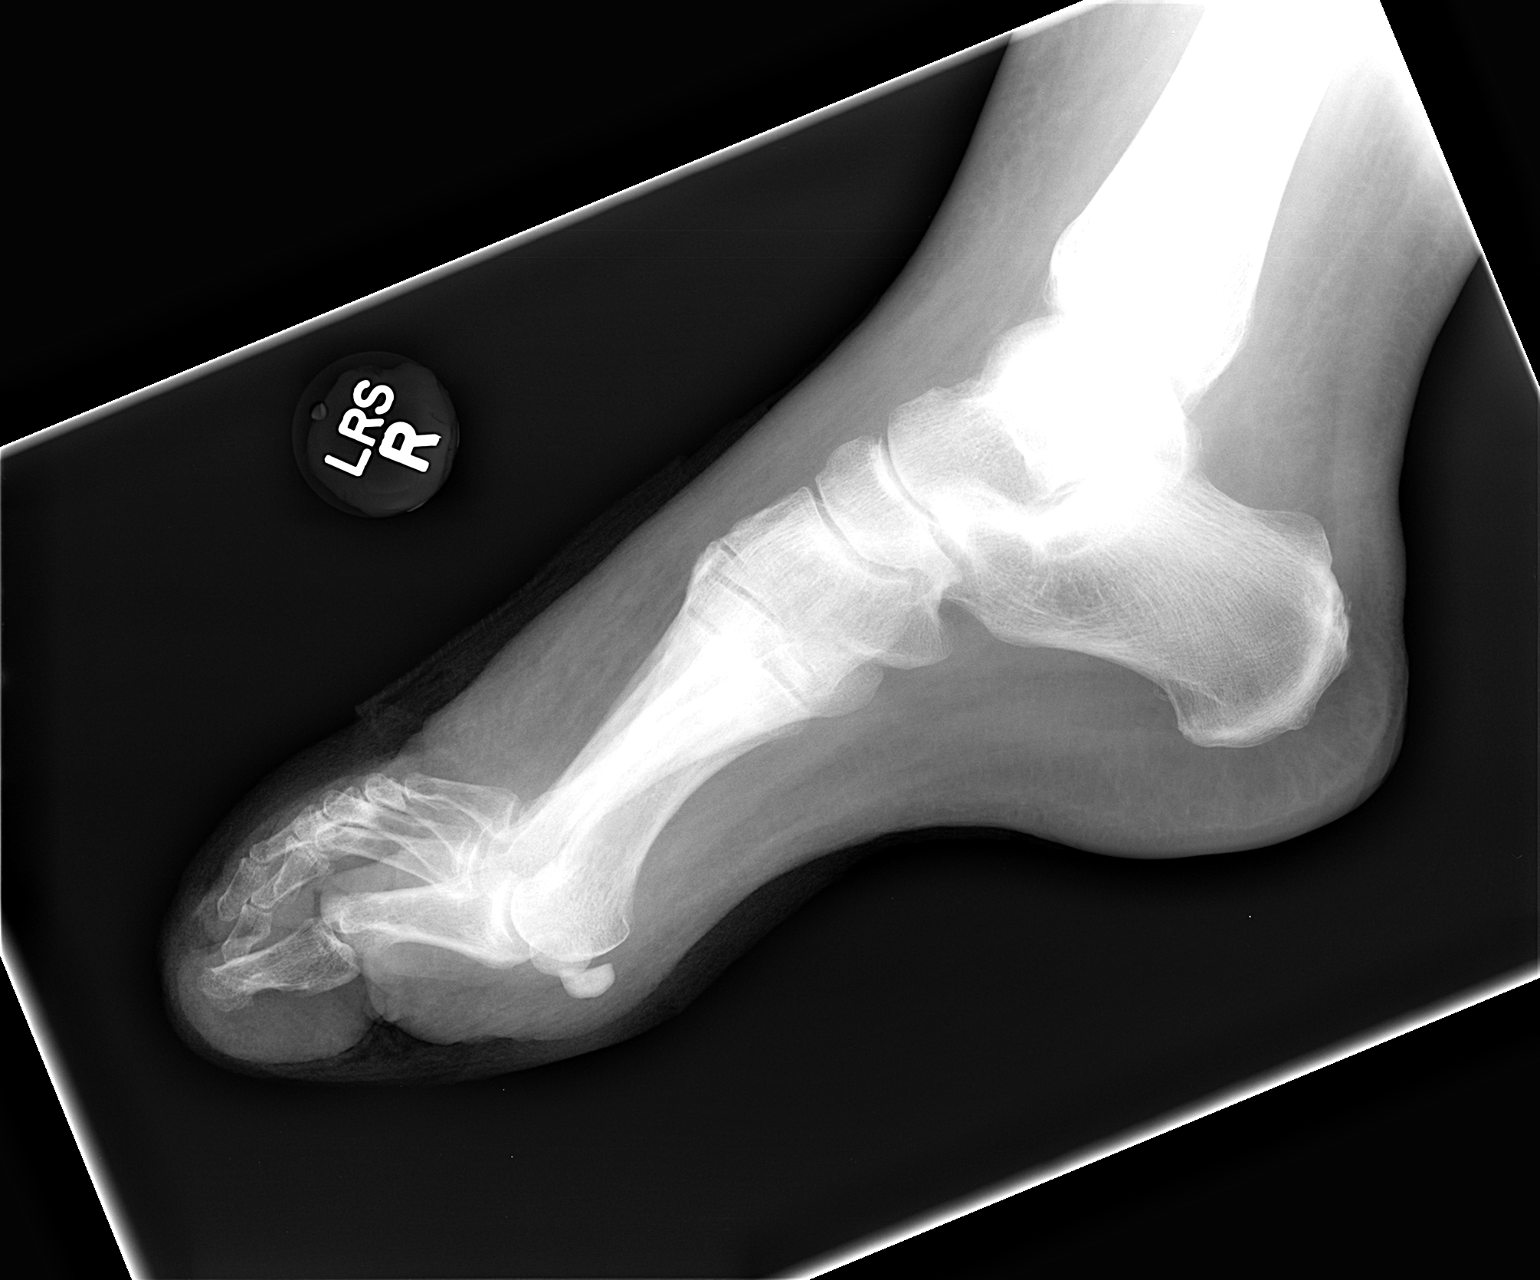

[3 of 3 positions shown; findings below may reference images not displayed]

FINDINGS: No evidence of fracture or dislocation.  No evidence of
arthropathy or other significant bone abnormality.
IMPRESSION: Negative.

## 2011-02-05 ENCOUNTER — Ambulatory Visit (HOSPITAL_COMMUNITY): Payer: Medicare Other | Admitting: Oncology

## 2011-02-06 NOTE — Op Note (Signed)
  [name], [name]              ACCOUNT NO.:  [id number]  MEDICAL RECORD NO.:  [id number]  LOCATION:  SDSC                         FACILITY:  [facility]  PHYSICIAN:  Di Kindle. Edilia Bo, M.D.DATE OF BIRTH:  October 31, 1970  DATE OF PROCEDURE: DATE OF DISCHARGE:  02/02/2011                              OPERATIVE REPORT   PREOPERATIVE DIAGNOSIS:  Chronic kidney disease, stage V.  POSTOPERATIVE DIAGNOSIS:  Chronic kidney disease, stage V.  PROCEDURE:  Placement of a new left upper arm arteriovenous graft.  SURGEON:  Di Kindle. Edilia Bo, MD  ASSISTANT:  Zenaida Niece, RNFA  ANESTHESIA:  Local with sedation.  TECHNIQUE:  The patient was taken to the operating room and sedated by Anesthesia.  The left upper extremity was prepped and draped in usual sterile fashion.  After the skin was infiltrated with 1% lidocaine, a longitudinal incision was made just above the antecubital level where the brachial artery was dissected free and had significant scarring around it from a previous forearm graft.  The artery was somewhat fragile but had no significant plaque in it.  A separate longitudinal incision was made beneath the axilla after the skin was anesthetized and high brachial vein was dissected free.  It was a reasonable size vein approximately 5 mm.  4-7 mm graft was tunneled between the two incisions.  The patient was heparinized.  The brachial artery was clamped proximally and distally and a longitudinal arteriotomy was made. The graft was spatulated after the majority of the 4 mm end of the graft was excised and the graft was sewn end-to-side to the artery using continuous 6-0 Prolene suture.  The graft was then pulled to the appropriate length for anastomosis to the high brachial vein.  The vein was ligated distally and spatulated proximally.  The graft was cut to the appropriate length, spatulated and sewn end-to-end to the vein using continuous 6-0 Prolene suture.  At  completion, there was an excellent thrill in the graft and a brisk radial and ulnar signal with the Doppler.  The heparin was partially reversed with protamine.  The wounds were closed with deep layer of 3-0 Vicryl.  The skin was closed with 4-0 Vicryl.  Sterile dressing was applied.  The patient tolerated the procedure well and was transferred to the recovery room in stable condition.  All needle and sponge counts were correct.     Di Kindle. Edilia Bo, M.D.    CSD/MEDQ  D:  02/02/2011  T:  02/03/2011  Job:  [id number]  Electronically Signed by Waverly Ferrari M.D. on 02/06/2011 04:54:09 PM

## 2011-02-07 ENCOUNTER — Encounter (HOSPITAL_COMMUNITY): Payer: Self-pay

## 2011-02-09 NOTE — Telephone Encounter (Signed)
Rxrequest  Was for Dr. Karilyn Cota . I called the pharmacy and informed them. They had his new phone number, but had not changed the fax number. I gave it to them and they are to fax to Dr. Karilyn Cota.  Opened in error.

## 2011-02-14 ENCOUNTER — Ambulatory Visit (INDEPENDENT_AMBULATORY_CARE_PROVIDER_SITE_OTHER): Payer: Medicare Other | Admitting: Internal Medicine

## 2011-02-14 DIAGNOSIS — K3184 Gastroparesis: Secondary | ICD-10-CM

## 2011-02-22 IMAGING — US US EXTREM LOW VENOUS BILAT
1 series · 14 of 24 positions shown · non-contrast
Comparison: None.

CLINICAL DATA: Bilateral leg pain and swelling

BILATERAL LOWER EXTREMITY VENOUS DUPLEX ULTRASOUND
TECHNIQUE: Gray-scale sonography with graded compression, as well
as color Doppler and duplex ultrasound, were performed to evaluate
the deep venous system of both lower extremities from the level of
the common femoral vein through the popliteal and proximal calf
veins.  Spectral Doppler was utilized to evaluate flow at rest and
with distal augmentation maneuvers.

[Series 1: us extrem low venous bilat · 14 of 101 slices shown]
[im 1/101]
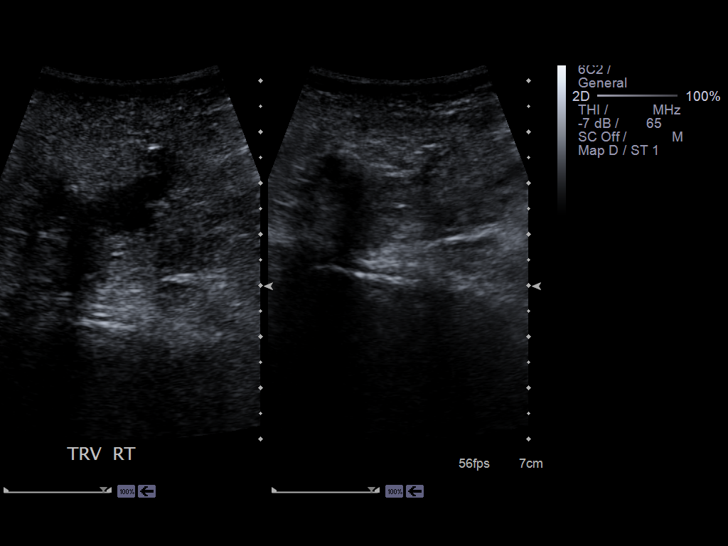
[im 9/101]
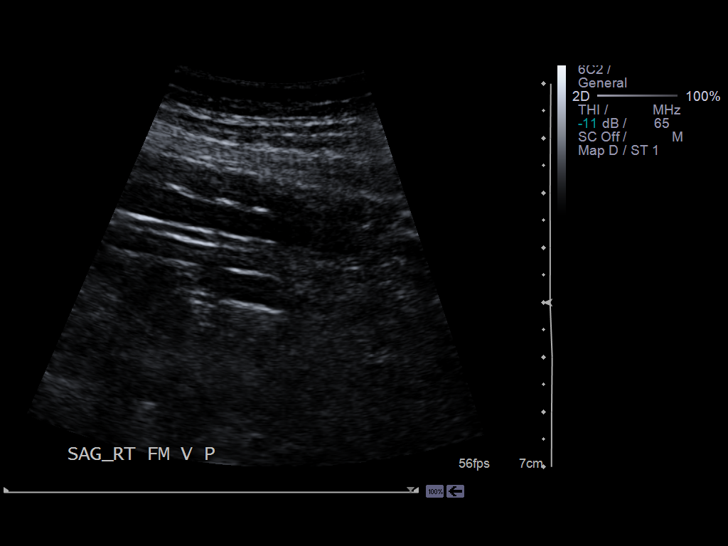
[im 18/101]
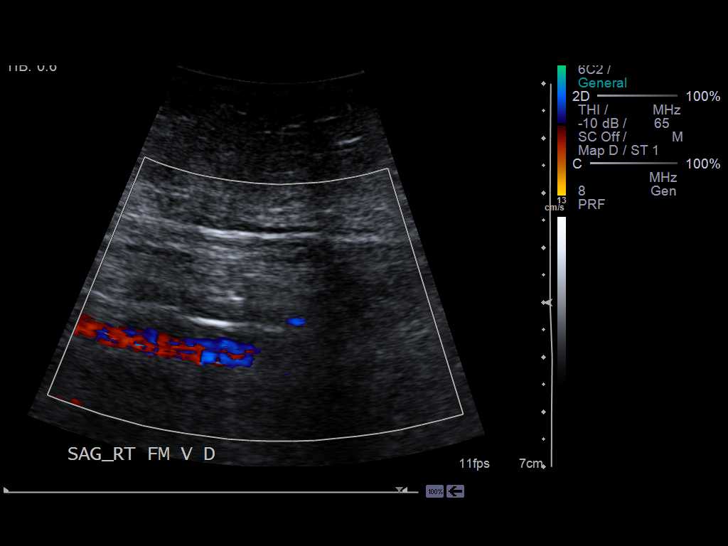
[im 27/101]
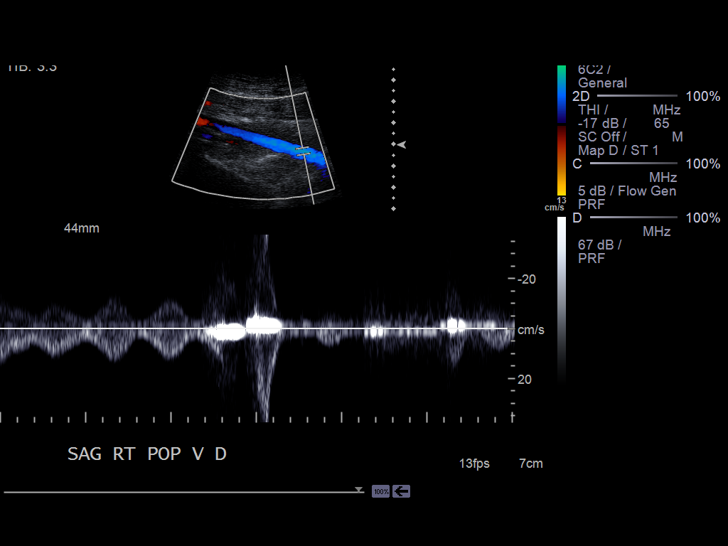
[im 31/101]
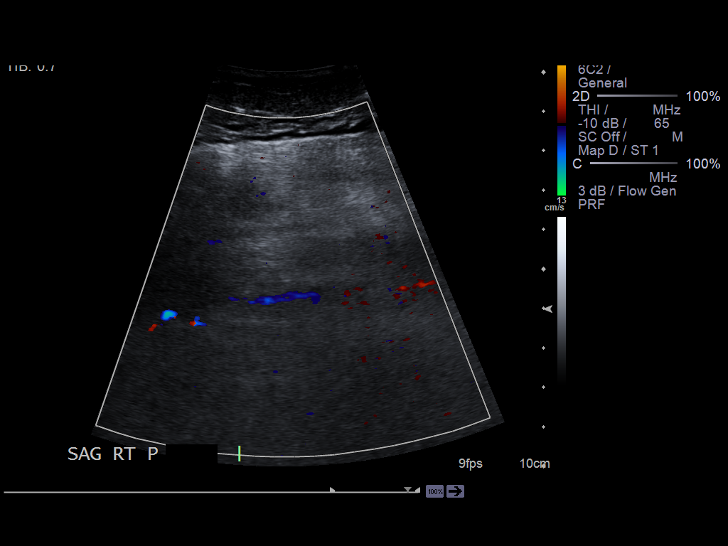
[im 40/101]
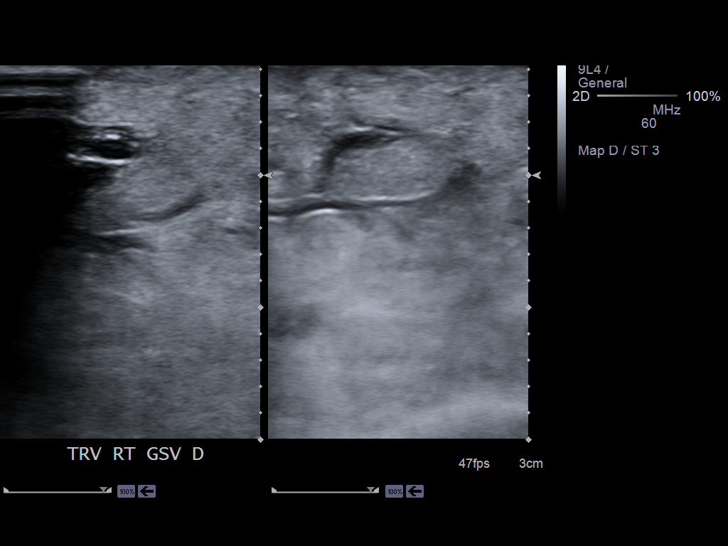
[im 48/101]
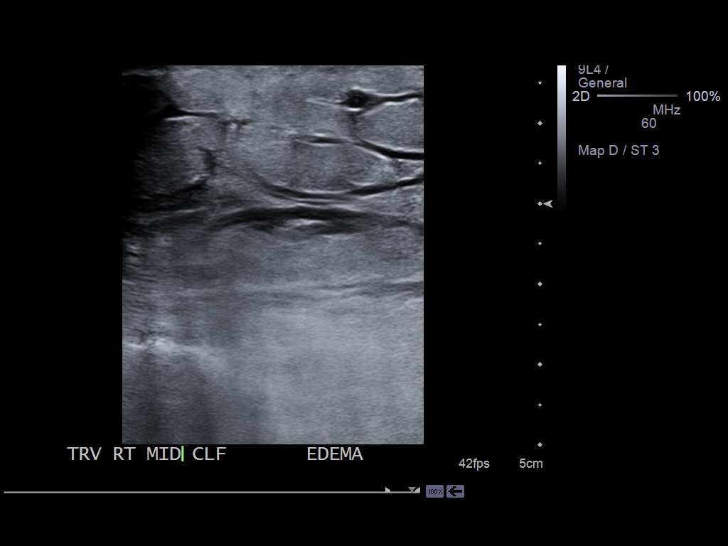
[im 53/101]
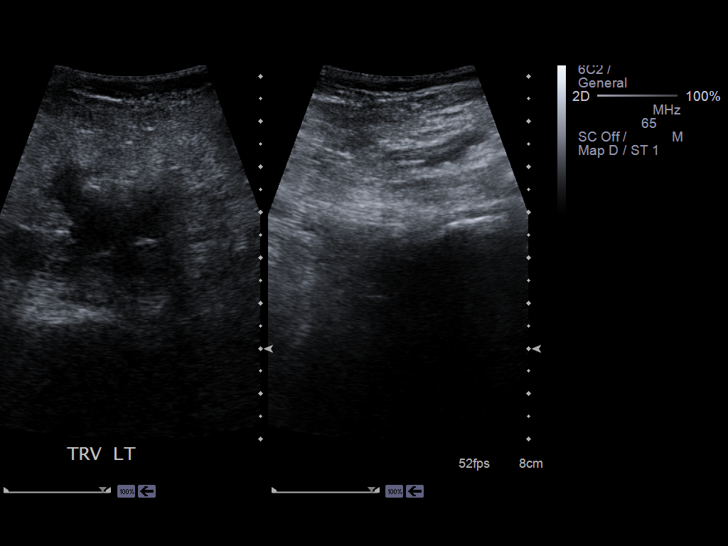
[im 61/101]
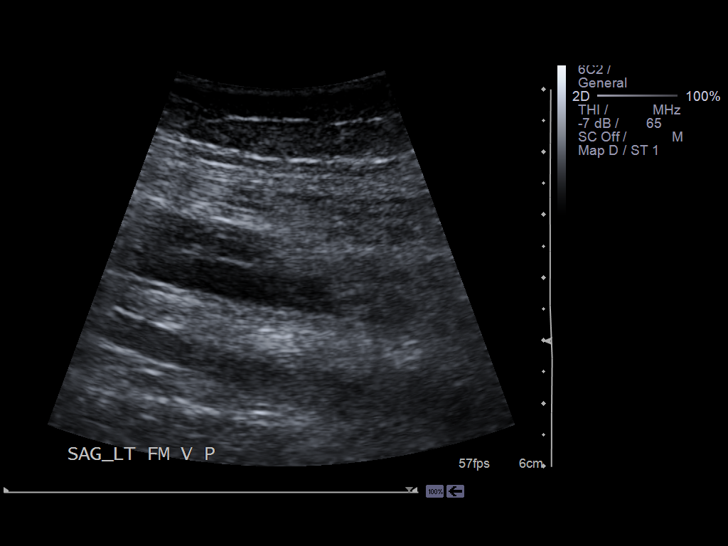
[im 70/101]
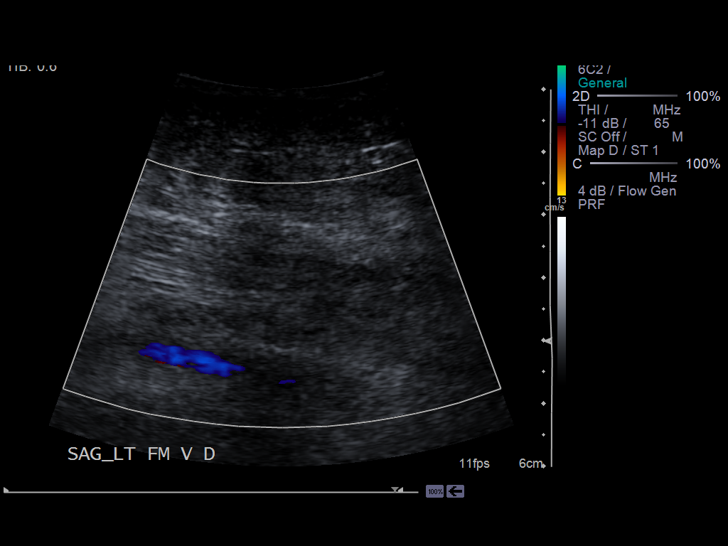
[im 79/101]
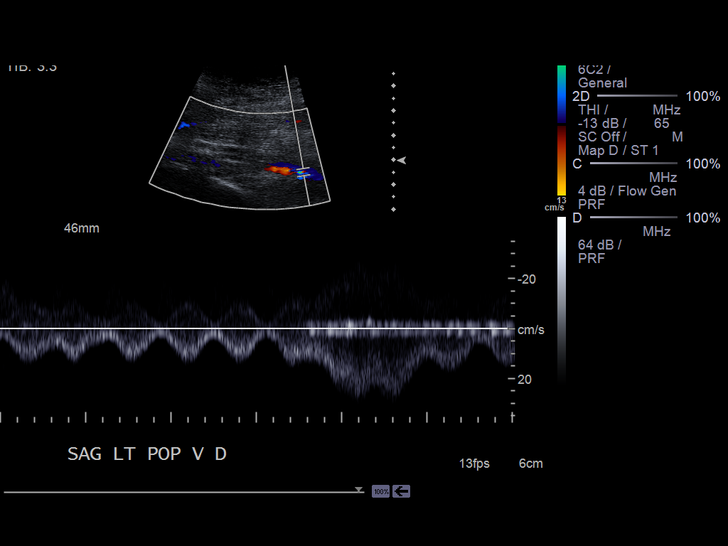
[im 83/101]
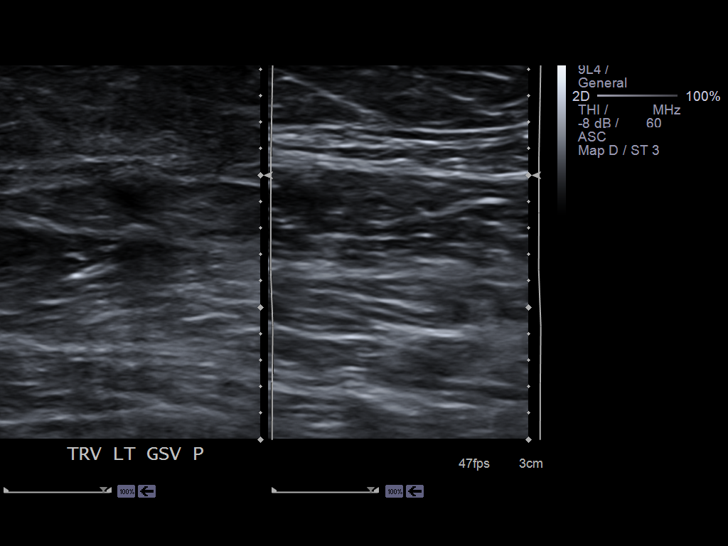
[im 92/101]
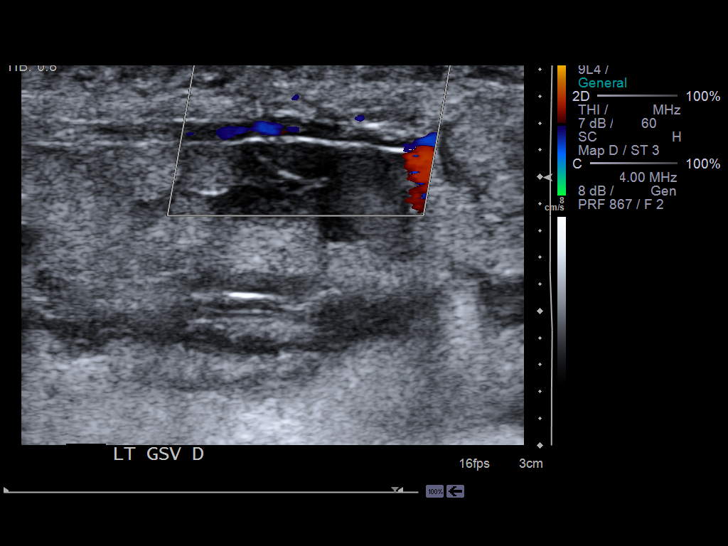
[im 101/101]
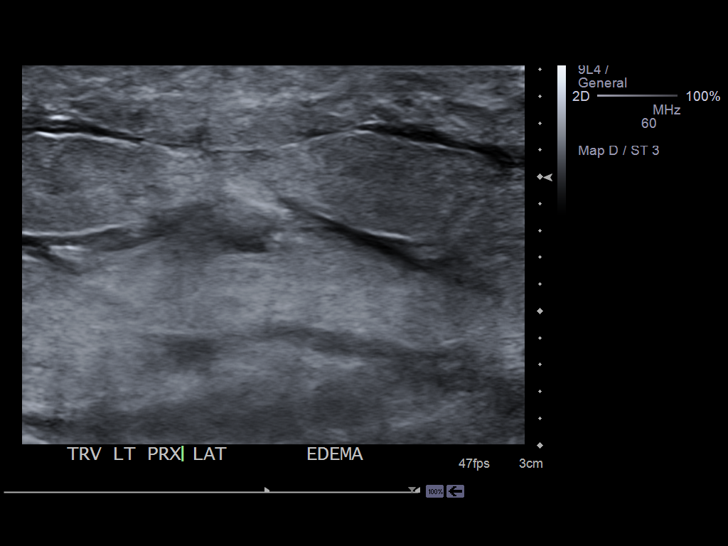

[14 of 24 positions shown; findings below may reference images not displayed]

FINDINGS: Normal compressibility of bilateral common femoral,
superficial femoral, and popliteal veins is demonstrated, as well
as the visualized proximal calf veins.  No filling defects to
suggest DVT on grayscale or color Doppler imaging.  Doppler
waveforms show normal direction of venous flow, normal respiratory
phasicity and response to augmentation. There is extensive
subcutaneous edema about of abscess.
IMPRESSION: No evidence of bilateral lower extremity deep vein thrombosis.

Subcutaneous edema.

## 2011-02-22 IMAGING — CR DG ABDOMEN 2V
3 series · 3 of 3 positions shown · non-contrast
Comparison: 08/10/2009

CLINICAL DATA: Chest pain.  Vomiting.  Short of breath.

ABDOMEN - 2 VIEW

[view not recorded (1 of 3)]
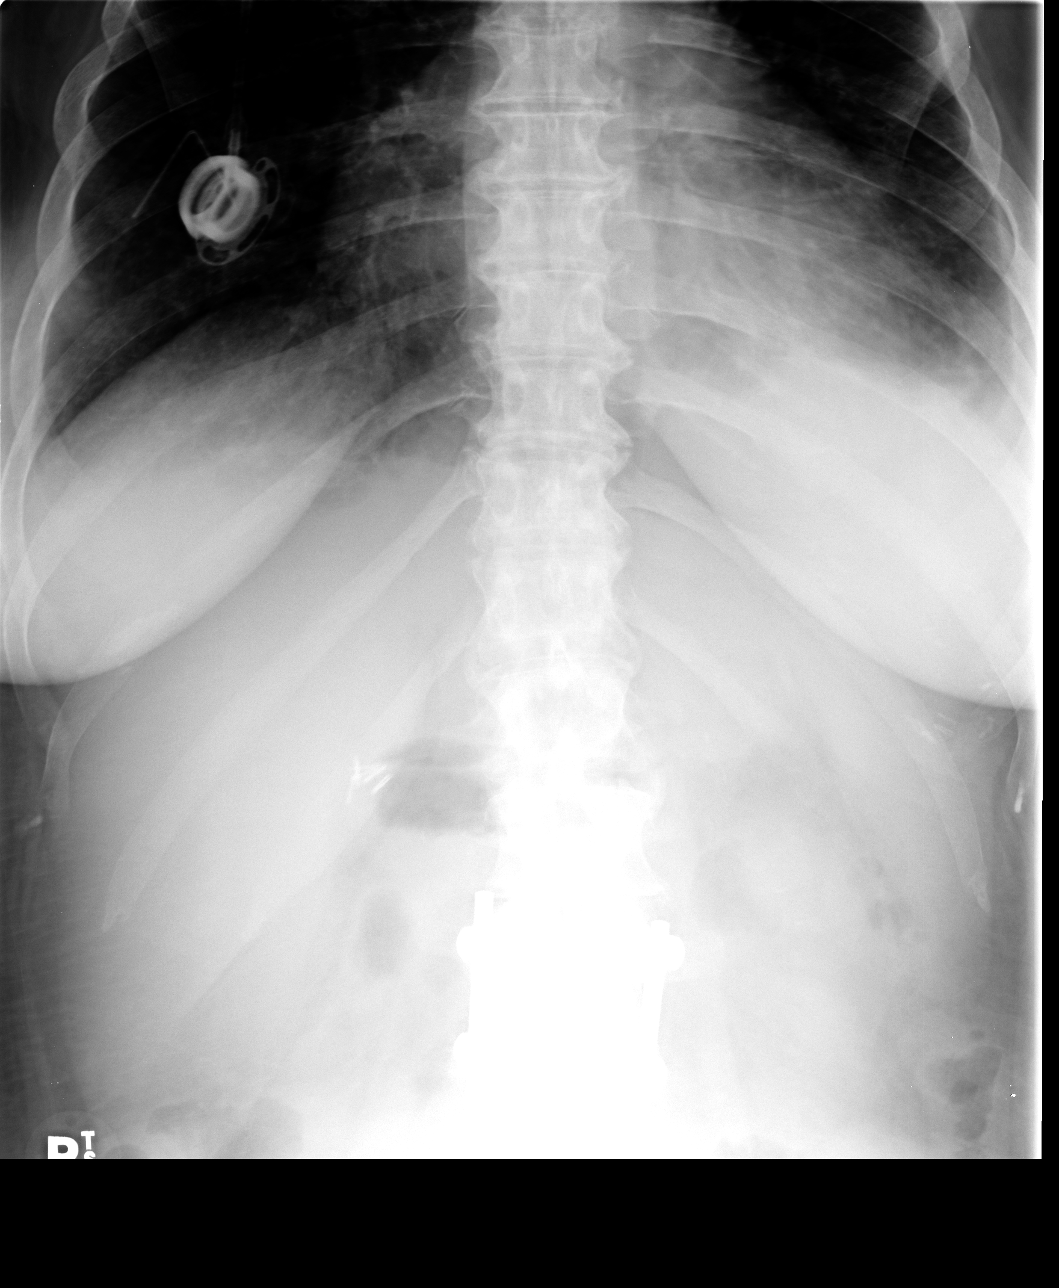

[view not recorded (2 of 3)]
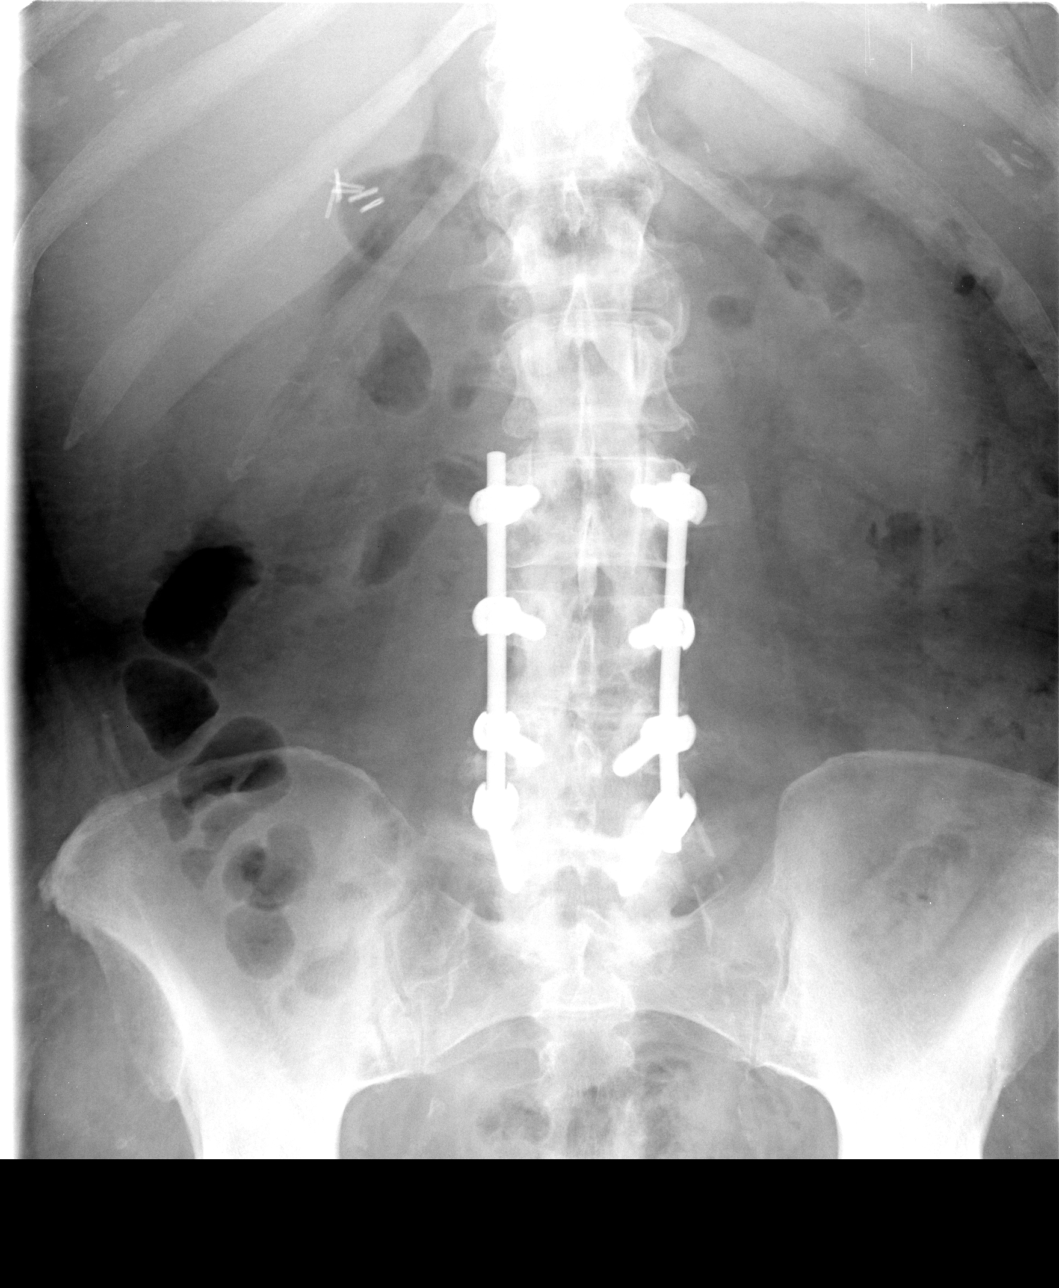

[view not recorded (3 of 3)]
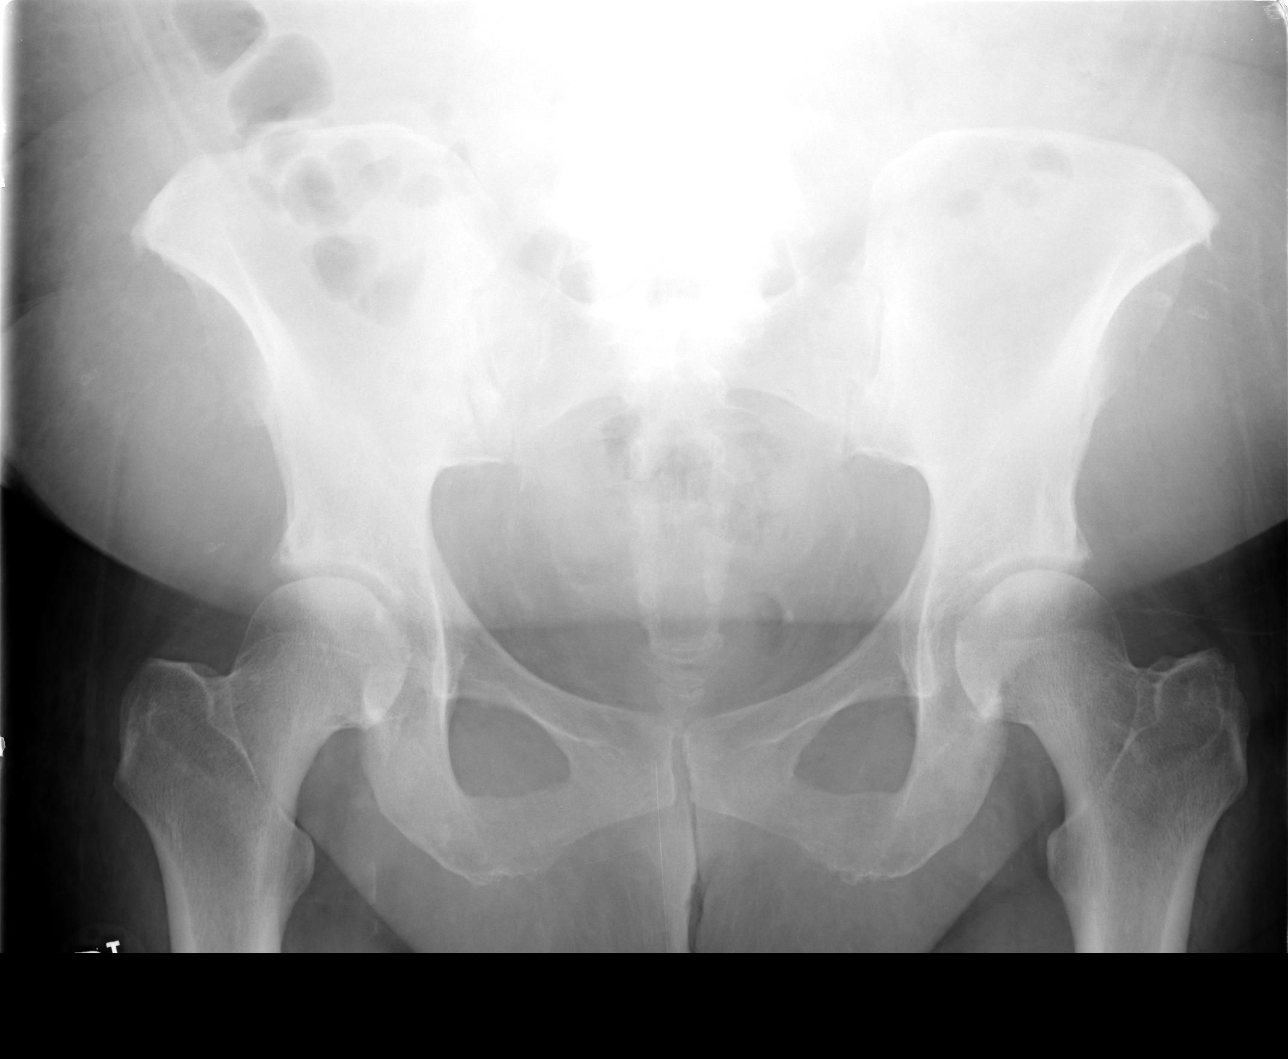

[3 of 3 positions shown; findings below may reference images not displayed]

FINDINGS: Upright view abdomen and supine views of the abdomen
pelvis.  Upright view demonstrates no free intraperitoneal air.
Lower lumbar spine fixation.  Prior cholecystectomy.

No significant air fluid levels.  Probable bibasilar airspace
disease and small bilateral pleural effusions.  These will be
better evaluated on chest film.

Supine image demonstrates no bowel distention.  Distal gas and
stool.  Age advanced vascular calcifications, including within the
left common iliac artery.
IMPRESSION: 1.  No evidence of bowel obstruction or free intraperitoneal air.
2.  Age advanced vascular calcifications.

## 2011-02-22 IMAGING — CR DG CHEST 2V
2 series · 2 of 2 positions shown · non-contrast
Comparison: 12/29/2009

CLINICAL DATA: Chest pain.  Vomiting.  Short of breath.

CHEST - 2 VIEW

[view not recorded (1 of 2)]
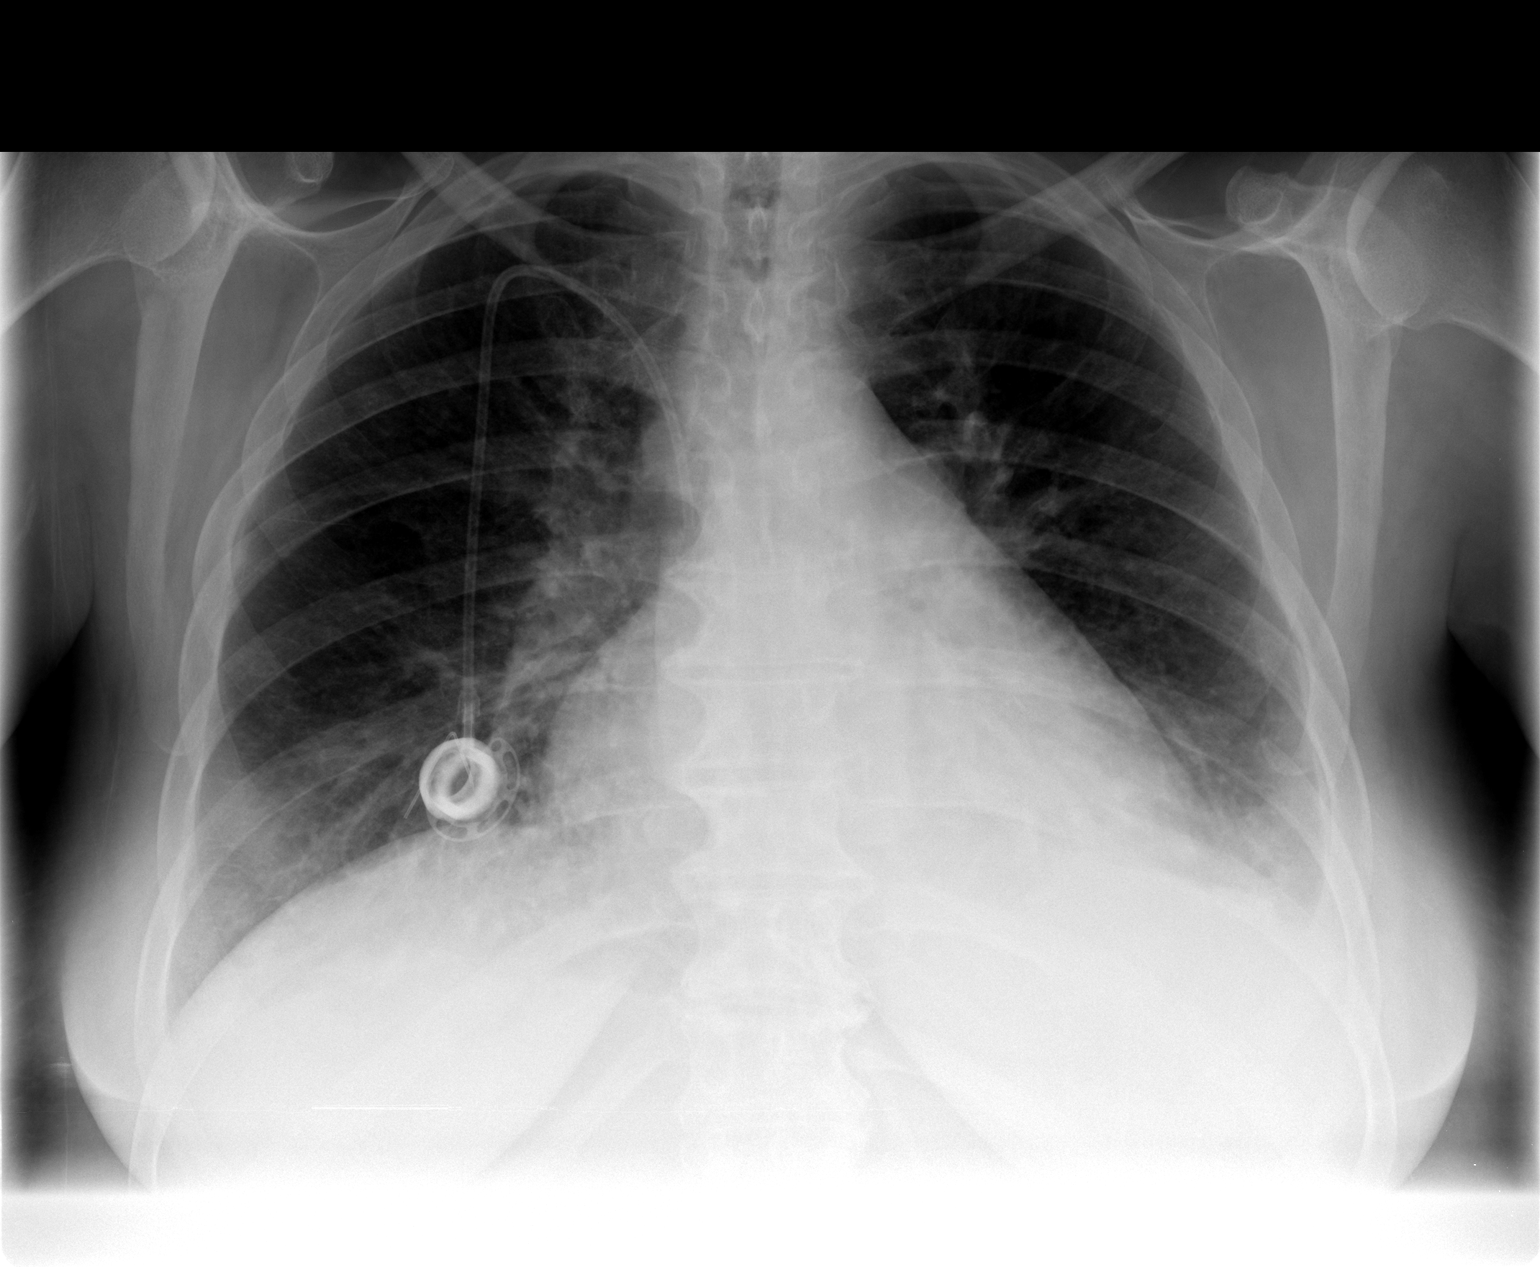

[view not recorded (2 of 2)]
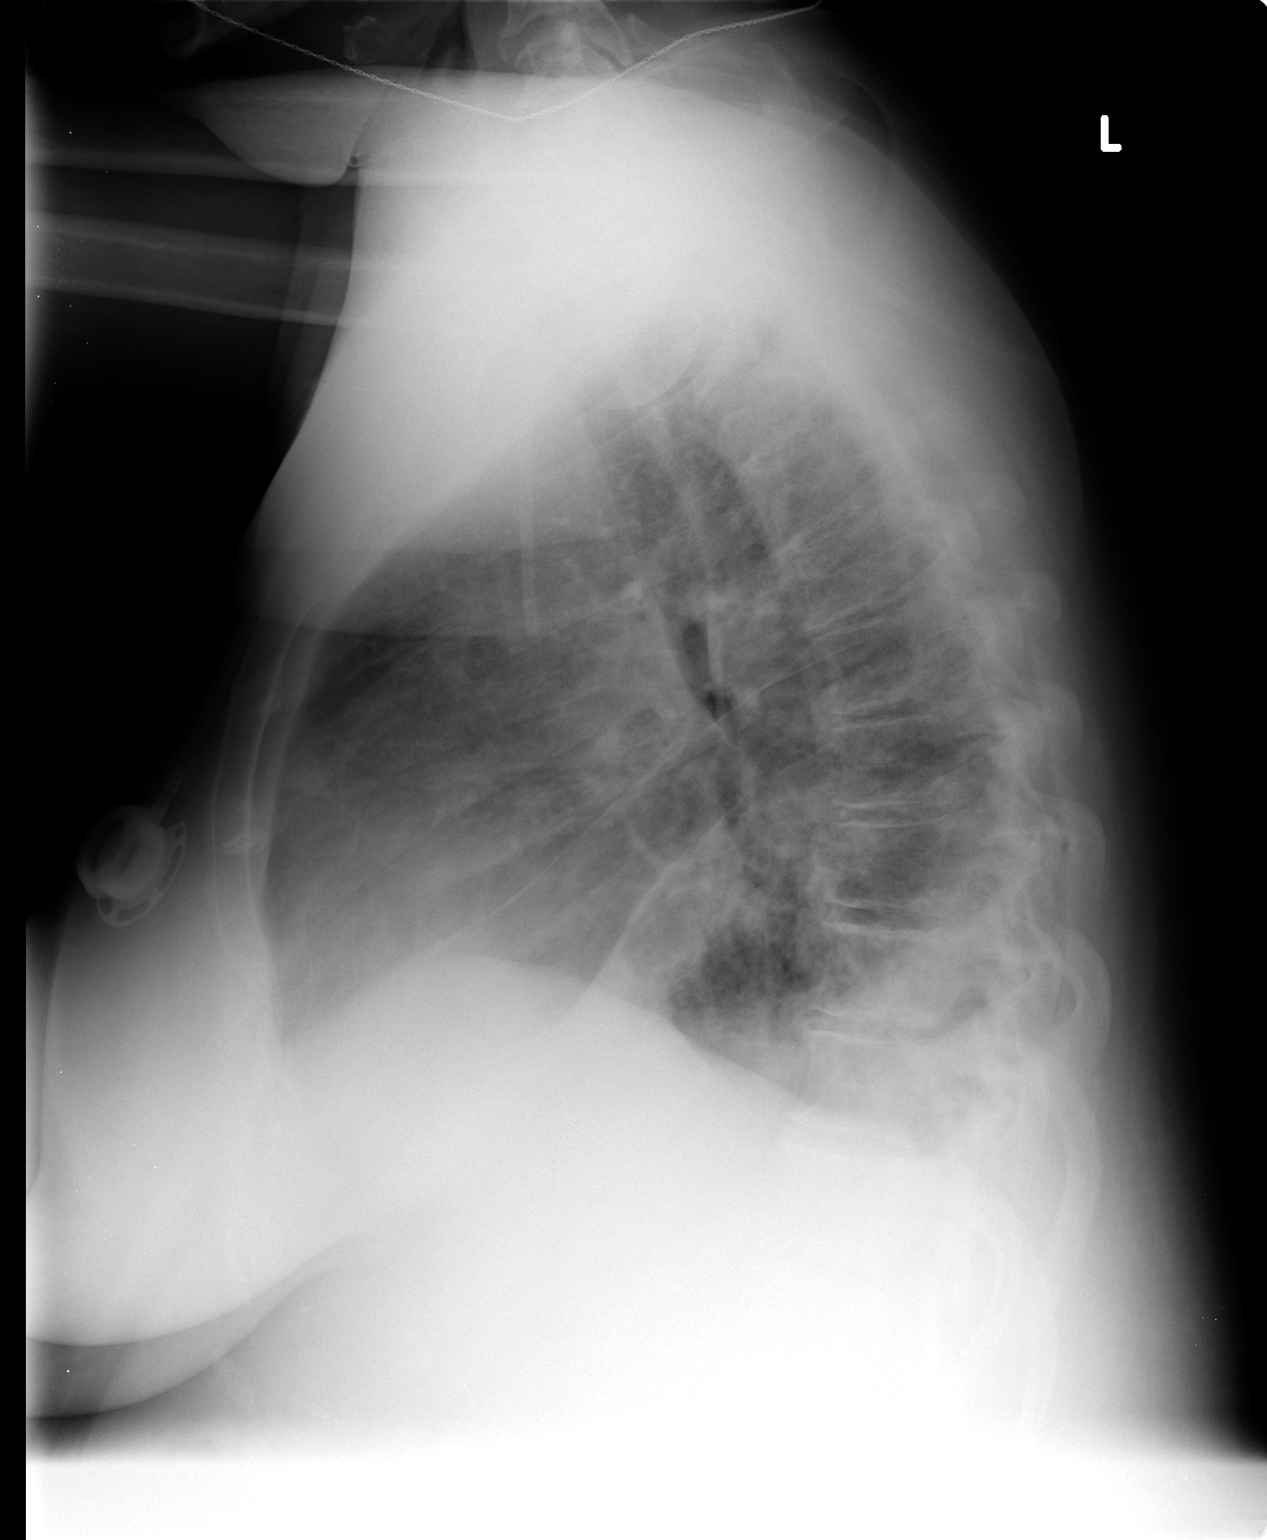

[2 of 2 positions shown; findings below may reference images not displayed]

FINDINGS: A Port-A-Cath terminates at the mid SVC. Midline trachea.
Mild cardiomegaly. Mediastinal contours otherwise within normal
limits.  Development of a small left pleural effusion. No
pneumothorax.  Development of left lower lobe airspace disease.
Most apparent on the lateral view.
IMPRESSION: 1.  Left lower lobe airspace disease, suspicious for infection.
Adjacent small left pleural effusion. Recommend radiographic follow-
up until clearing.
2.  Cardiomegaly without congestive failure.

## 2011-03-05 ENCOUNTER — Other Ambulatory Visit (HOSPITAL_COMMUNITY): Payer: Self-pay | Admitting: Oncology

## 2011-03-05 DIAGNOSIS — Z86718 Personal history of other venous thrombosis and embolism: Secondary | ICD-10-CM

## 2011-03-05 DIAGNOSIS — I82409 Acute embolism and thrombosis of unspecified deep veins of unspecified lower extremity: Secondary | ICD-10-CM

## 2011-03-07 ENCOUNTER — Ambulatory Visit (INDEPENDENT_AMBULATORY_CARE_PROVIDER_SITE_OTHER): Payer: Medicare Other

## 2011-03-07 ENCOUNTER — Ambulatory Visit: Payer: Medicare Other | Admitting: Vascular Surgery

## 2011-03-07 ENCOUNTER — Ambulatory Visit: Payer: Medicare Other | Admitting: Adult Health

## 2011-03-07 DIAGNOSIS — N186 End stage renal disease: Secondary | ICD-10-CM

## 2011-03-07 NOTE — Assessment & Plan Note (Signed)
OFFICE VISIT  [name], [name] B DOB:  1971-07-08                                       03/07/2011 ZOXWR#:[id number]  DATE OF SURGERY:  02/02/2011.  The patient presents today for follow-up status post placement of left upper arm AV Gore-Tex graft on February 02, 2011 by Dr. Edilia Bo.  The patient states she has done well since surgery.  She has been utilizing a Diatek catheter placed by interventional radiology for hemodialysis on Tuesday, Thursday, Saturday without difficulty.  The patient denies all signs and symptoms of steal, including hand numbness, tingling and pain.  PHYSICAL EXAMINATION:  Blood pressure 153/85 right arm sitting, O2 saturation 97% on room air, heart rate 93.  There is an excellent thrill in the left upper arm graft.  The incisions are healing well.  There are palpable 2+ left radial and ulnar pulses.  Motor and sensation intact in the left upper extremity.  The hand is warm and well-perfused.  ASSESSMENT/PLAN:  End-stage renal disease, status post placement of the left upper arm arteriovenous graft on February 02, 2011.  The patient is 4 weeks status post placement of graft and can utilize this on dialysis at this time.  The patient does have a Diatek catheter that was placed by interventional radiology, and once the graft has been successfully used on several occasions, the renal service will need to contact radiology for removal of this catheter.  This was explained extensively to the patient, and she had her sister both understand this.  The patient will follow up with [place] p.r.n.  Pecola Leisure, PA  Di Kindle. Edilia Bo, M.D. Electronically Signed  AY/MEDQ  D:  03/07/2011  T:  03/07/2011  Job:  [id number]

## 2011-03-09 ENCOUNTER — Ambulatory Visit (HOSPITAL_COMMUNITY): Payer: Medicare Other | Admitting: Oncology

## 2011-03-12 ENCOUNTER — Ambulatory Visit: Payer: Medicare Other | Admitting: Adult Health

## 2011-03-20 ENCOUNTER — Other Ambulatory Visit (HOSPITAL_COMMUNITY): Payer: Self-pay | Admitting: Nephrology

## 2011-03-20 ENCOUNTER — Ambulatory Visit (HOSPITAL_COMMUNITY)
Admission: RE | Admit: 2011-03-20 | Discharge: 2011-03-20 | Disposition: A | Discharge status: Home / Self Care | Payer: Medicare Other | Source: Ambulatory Visit | Attending: Nephrology | Admitting: Nephrology

## 2011-03-20 ENCOUNTER — Ambulatory Visit (HOSPITAL_COMMUNITY): Payer: Medicare Other

## 2011-03-20 DIAGNOSIS — N186 End stage renal disease: Secondary | ICD-10-CM | POA: Insufficient documentation

## 2011-03-20 DIAGNOSIS — Z452 Encounter for adjustment and management of vascular access device: Secondary | ICD-10-CM | POA: Insufficient documentation

## 2011-04-04 ENCOUNTER — Encounter (HOSPITAL_COMMUNITY): Payer: Medicare Other

## 2011-04-06 ENCOUNTER — Encounter (HOSPITAL_COMMUNITY): Payer: Medicare Other | Attending: Oncology

## 2011-04-06 DIAGNOSIS — Z452 Encounter for adjustment and management of vascular access device: Secondary | ICD-10-CM

## 2011-04-06 DIAGNOSIS — N19 Unspecified kidney failure: Secondary | ICD-10-CM

## 2011-04-06 MED ORDER — HEPARIN SOD (PORK) LOCK FLUSH 100 UNIT/ML IV SOLN
INTRAVENOUS | Status: AC
  Filled 2011-04-06: qty 5

## 2011-04-06 NOTE — Progress Notes (Signed)
Rt accessed and flushed per clinic protocol. Good blood return.  Tolerated well.

## 2011-04-15 ENCOUNTER — Emergency Department (HOSPITAL_COMMUNITY)
Admission: EM | Admit: 2011-04-15 | Discharge: 2011-04-15 | Disposition: A | Discharge status: Home / Self Care | Payer: Medicare Other | Attending: Emergency Medicine | Admitting: Emergency Medicine

## 2011-04-15 ENCOUNTER — Encounter (HOSPITAL_COMMUNITY): Payer: Self-pay | Admitting: *Deleted

## 2011-04-15 DIAGNOSIS — R197 Diarrhea, unspecified: Secondary | ICD-10-CM | POA: Insufficient documentation

## 2011-04-15 DIAGNOSIS — R112 Nausea with vomiting, unspecified: Secondary | ICD-10-CM

## 2011-04-15 DIAGNOSIS — Z794 Long term (current) use of insulin: Secondary | ICD-10-CM | POA: Insufficient documentation

## 2011-04-15 DIAGNOSIS — E785 Hyperlipidemia, unspecified: Secondary | ICD-10-CM | POA: Insufficient documentation

## 2011-04-15 DIAGNOSIS — R109 Unspecified abdominal pain: Secondary | ICD-10-CM | POA: Insufficient documentation

## 2011-04-15 DIAGNOSIS — K3184 Gastroparesis: Secondary | ICD-10-CM | POA: Insufficient documentation

## 2011-04-15 DIAGNOSIS — I10 Essential (primary) hypertension: Secondary | ICD-10-CM | POA: Insufficient documentation

## 2011-04-15 DIAGNOSIS — E119 Type 2 diabetes mellitus without complications: Secondary | ICD-10-CM | POA: Insufficient documentation

## 2011-04-15 DIAGNOSIS — Z79899 Other long term (current) drug therapy: Secondary | ICD-10-CM | POA: Insufficient documentation

## 2011-04-15 MED ORDER — METOCLOPRAMIDE HCL 5 MG/ML IJ SOLN
10.0000 mg | Freq: Once | INTRAMUSCULAR | Status: AC
  Administered 2011-04-15: 10 mg via INTRAVENOUS
  Filled 2011-04-15: qty 2

## 2011-04-15 MED ORDER — ONDANSETRON HCL 4 MG/2ML IJ SOLN
4.0000 mg | Freq: Once | INTRAMUSCULAR | Status: AC
  Administered 2011-04-15: 4 mg via INTRAVENOUS
  Filled 2011-04-15: qty 2

## 2011-04-15 MED ORDER — HYDROMORPHONE HCL 1 MG/ML IJ SOLN
1.0000 mg | Freq: Once | INTRAMUSCULAR | Status: AC
  Administered 2011-04-15: 1 mg via INTRAVENOUS
  Filled 2011-04-15: qty 1

## 2011-04-15 MED ORDER — SODIUM CHLORIDE 0.9 % IV BOLUS (SEPSIS)
500.0000 mL | Freq: Once | INTRAVENOUS | Status: AC
  Administered 2011-04-15: 500 mL via INTRAVENOUS

## 2011-04-15 NOTE — ED Notes (Signed)
Pt a/ox4. Resp even and unlabored. NAD at this time. Pt stable./ D/c instructions reviewed with pt and mother. Both verbalized understanding. Pt escorted to family members tx room. Pt to be transported home by mother.

## 2011-04-15 NOTE — ED Provider Notes (Signed)
Scribed for [name], MD, the patient was seen in room 6. This chart was scribed by Jannette Fogo and Gilman Schmidt. This patient's care was started at 1642.   CSN: [id number] Arrival date & time: 04/15/2011  4:00 PM  Chief Complaint  Patient presents with  . Abdominal Pain  . Emesis  . Migraine   HPI [name] is a 40 y.o. female with a history of similar symptoms associated with gastroparesis and currently on Dialysis who presents to the Emergency Department complaining of 3 days of abdominal pain and vomiting. Patient developed abdominal pain on Friday 04/13/11 that is described as a burning sensation. The patient has Dialysis on Tuesdays, Thursday, and Saturdays that is done through the left arm, and reports that she vomited the entire time throughout her dialysis yesterday. Patient gave herself Phenergan and Reglan injections but reports no improvement. She reports a history of similar symptoms with gastroparesis in the past which is usually improved after Zofran and Dilaudid.  Additionally, the patient reports one month of a right ankle wound and receives wound care at home. She was treated with antibiotics per Dr. Loney Hering. She also states that she has had a non productive cough but denies any fever or sore throat. There are no other associated symptoms and no other alleviating or aggravating factors.       HPI ELEMENTS:  Location: abdomen Onset: 04/13/11 Duration: 3 days  Timing: constant  Quality: burning  Context: as above  Associated symptoms: vomiting   PAST MEDICAL HISTORY:  Past Medical History  Diagnosis Date  . DVT (deep venous thrombosis)     DVT HISTORY  . Renal failure   . Diabetes mellitus   . Migraine     HISTORY  . Anxiety and depression   . CAD (coronary artery disease)   . Bipolar 1 disorder   . Nephrotic syndrome     IN THE PAST  . Hyperlipidemia   . Hyperparathyroidism   . PE (pulmonary embolism)     HISTORY  . Dialysis patient   . Anemia      PAST SURGICAL HISTORY:  Past Surgical History  Procedure Date  . Back surgery X 4  . Cholecystectomy   . Incise and drain abcess     OF THIGHS FROM INSULIN INJECTIONS  . Heart stents     3 - 4  STENTS PLACED  . Portacath placement 2003    MEDICATIONS:  Previous Medications   ALBUTEROL (PROVENTIL HFA;VENTOLIN HFA) 108 (90 BASE) MCG/ACT INHALER    Inhale 2 puffs into the lungs every 6 (six) hours as needed. Shortness of breath     AMLODIPINE (NORVASC) 10 MG TABLET    Take 10 mg by mouth daily.     B COMPLEX-VITAMIN C-FOLIC ACID (NEPHRO-VITE) 0.8 MG TABS    Take 0.8 mg by mouth at bedtime.     BUTALBITAL-ACETAMINOPHEN-CAFFEINE (FIORICET, ESGIC) 50-325-40 MG PER TABLET    Take 1 tablet by mouth every 6 (six) hours as needed.     CINACALCET (SENSIPAR) 30 MG TABLET    Take 30 mg by mouth daily.     CITALOPRAM (CELEXA) 40 MG TABLET    Take 40 mg by mouth daily.     CLONIDINE (CATAPRES) 0.2 MG TABLET    Take 0.2 mg by mouth 2 (two) times daily.     CLONIDINE (CATAPRES) 0.3 MG TABLET    Take 0.3 mg by mouth 2 (two) times daily.     CLOPIDOGREL (PLAVIX)  75 MG TABLET    Take 75 mg by mouth daily.     CYCLOSPORINE (RESTASIS) 0.05 % OPHTHALMIC EMULSION    Place 1 drop into both eyes 2 (two) times daily.     ESOMEPRAZOLE (NEXIUM) 40 MG CAPSULE    Take 40 mg by mouth 2 (two) times daily.     HYDROCODONE-ACETAMINOPHEN (LORTAB) 10-500 MG PER TABLET    Take 1 tablet by mouth as needed.     INSULIN GLARGINE (LANTUS) 100 UNIT/ML INJECTION    Inject 30 Units into the skin 2 (two) times daily.     INSULIN GLARGINE (LANTUS) 100 UNIT/ML INJECTION    Inject 50 Units into the skin at bedtime.     INSULIN LISPRO (HUMALOG) 100 UNIT/ML INJECTION    Inject 4-20 Units into the skin 4 (four) times daily -  before meals and at bedtime. TAKING ON SLIDING SCALE 200-4 untis 250=8 units 300=12 units 350=16 units 400=20 units More than 400 call DR.    LISINOPRIL (PRINIVIL,ZESTRIL) 10 MG TABLET    Take 10 mg by  mouth daily.     LISINOPRIL (PRINIVIL,ZESTRIL) 40 MG TABLET    Take 40 mg by mouth daily.     METOCLOPRAMIDE (REGLAN) 10 MG TABLET    Take 20 mg by mouth 4 (four) times daily.     METOLAZONE (ZAROXOLYN) 5 MG TABLET    Take 5 mg by mouth 2 (two) times daily.     NABUMETONE (RELAFEN) 500 MG TABLET    Take 2,400 mg by mouth 4 (four) times daily.     OLOPATADINE (PATANOL) 0.1 % OPHTHALMIC SOLUTION    Place 1 drop into both eyes 2 (two) times daily.     OMEPRAZOLE (PRILOSEC) 20 MG CAPSULE    Take 20 mg by mouth 2 (two) times daily.     PAROXETINE (PAXIL) 40 MG TABLET    Take 80 mg by mouth every morning.     PROMETHAZINE (PHENERGAN) 25 MG TABLET    Take 25 mg by mouth every 6 (six) hours as needed. nausea   SEVELAMER (RENVELA) 800 MG TABLET    Take 4,000 mg by mouth 3 (three) times daily with meals.     SODIUM BICARBONATE 650 MG TABLET    Take 650 mg by mouth 2 (two) times daily.     SUCRALFATE (CARAFATE) 1 G TABLET    Take 1 g by mouth 2 (two) times daily before a meal.    TORSEMIDE (DEMADEX) 100 MG TABLET    Take 100 mg by mouth daily.     TRAZODONE (DESYREL) 100 MG TABLET    Take 100 mg by mouth at bedtime.     VALSARTAN-HYDROCHLOROTHIAZIDE (DIOVAN-HCT) 320-12.5 MG PER TABLET    Take 1 tablet by mouth daily.       ALLERGIES:  Allergies as of 04/15/2011 - Review Complete 04/15/2011  Allergen Reaction Noted  . Meperidine hcl Swelling   . Morphine Swelling   . Nubain (nalbuphine hcl) Swelling 04/15/2011  . Toradol (ketorolac tromethamine) Swelling 04/15/2011     FAMILY HISTORY:  No Pertinent Family History   SOCIAL HISTORY: Accompanied by female companion Social History Main Topics  . Smoking status: Current Everyday Smoker -- 1.5 packs/day  . Smokeless tobacco: Not on file  . Alcohol Use: No  . Drug Use: No    Review of Systems  Constitutional: Negative for fever and fatigue.  HENT: Negative for congestion, sore throat, sinus pressure and ear discharge.   Eyes: Negative  for  discharge.  Respiratory: Positive for cough.   Cardiovascular: Negative for chest pain.  Gastrointestinal: Positive for vomiting, abdominal pain and diarrhea.  Genitourinary: Negative for frequency and hematuria.  Musculoskeletal: Negative for back pain.  Skin: Negative for rash.  Neurological: Negative for seizures and headaches.  Hematological: Negative.   Psychiatric/Behavioral: Negative for hallucinations.  All other systems reviewed and are negative.    Physical Exam  BP 169/72  Pulse 102  Temp(Src) 98.7 F (37.1 C) (Oral)  Resp 16  Ht 5' 10.5" (1.791 m)  Wt 180 lb (81.647 kg)  BMI 25.46 kg/m2  SpO2 100%  Physical Exam  Constitutional: She is oriented to person, place, and time. She appears well-developed.  HENT:  Head: Normocephalic and atraumatic.       Dry mucous membranes  Eyes: Conjunctivae and EOM are normal. No scleral icterus.  Neck: Neck supple. No thyromegaly present.  Cardiovascular: Normal rate, regular rhythm, normal heart sounds and intact distal pulses.  Exam reveals no gallop and no friction rub.   No murmur heard. Pulmonary/Chest: Effort normal and breath sounds normal. No stridor. She has no wheezes. She has no rales. She exhibits no tenderness.       Port-a-cath right chest  Abdominal: She exhibits no distension. There is tenderness (mild). There is no rebound.  Musculoskeletal: Normal range of motion. She exhibits no edema.  Lymphadenopathy:    She has no cervical adenopathy.  Neurological: She is alert and oriented to person, place, and time. Coordination normal.  Skin: No rash noted. No erythema.       Healing ulcer on right lower leg  Psychiatric: She has a normal mood and affect. Her behavior is normal.    OTHER DATA REVIEWED: Nursing notes, vital signs, and past medical records reviewed.   DIAGNOSTIC STUDIES: Oxygen Saturation is 100% on room air, normal by my interpretation.     ED COURSE / COORDINATION OF CARE: 18:35 - Patient  complains of persistent abdominal pain, more Dilaudid ordered.  1920- Reevaluated, Patient feels improved. Discussed plan of discharge  MDM: gastroparesis.  IMPRESSION: Diagnoses that have been ruled out:  Diagnoses that are still under consideration:  Final diagnoses:  Nausea with vomiting    PLAN:  Home The patient is to return the emergency department if there is any worsening of symptoms. I have reviewed the discharge instructions with the patient and family.  CONDITION ON DISCHARGE: Stable  MEDICATIONS GIVEN IN THE E.D.  Medications  cloNIDine (CATAPRES) 0.2 MG tablet (not administered)  omeprazole (PRILOSEC) 20 MG capsule (not administered)  insulin glargine (LANTUS) 100 UNIT/ML injection (not administered)  lisinopril (PRINIVIL,ZESTRIL) 40 MG tablet (not administered)  PARoxetine (PAXIL) 40 MG tablet (not administered)  sevelamer (RENVELA) 800 MG tablet (not administered)  cinacalcet (SENSIPAR) 30 MG tablet (not administered)  valsartan-hydrochlorothiazide (DIOVAN-HCT) 320-12.5 MG per tablet (not administered)  torsemide (DEMADEX) 100 MG tablet (not administered)  albuterol (PROVENTIL HFA;VENTOLIN HFA) 108 (90 BASE) MCG/ACT inhaler (not administered)  olopatadine (PATANOL) 0.1 % ophthalmic solution (not administered)  cycloSPORINE (RESTASIS) 0.05 % ophthalmic emulsion (not administered)  HYDROmorphone (DILAUDID) injection 1 mg (1 mg Intravenous Given 04/15/11 1711)  ondansetron (ZOFRAN) injection 4 mg (4 mg Intravenous Given 04/15/11 1711)  sodium chloride 0.9 % bolus 500 mL (500 mL Intravenous Given 04/15/11 1710)  metoCLOPramide (REGLAN) injection 10 mg (10 mg Intravenous Given 04/15/11 1711)  HYDROmorphone (DILAUDID) injection 1 mg (1 mg Intravenous Given 04/15/11 1917)  ondansetron (ZOFRAN) injection 4 mg (4 mg Intravenous Given  04/15/11 1917)    DISCHARGE MEDICATIONS: New Prescriptions   No medications on file   Procedures  Pt improved with tx.  The chart was  scribed for me under my direct supervision.  I personally performed the history, physical, and medical decision making and all procedures in the evaluation of this patient.[name], MD 04/15/11 [phone]

## 2011-04-15 NOTE — ED Notes (Signed)
Pt states she has been vomiting for 2 days her stomach is hurting severely and she also c/o migraine headache.

## 2011-04-16 ENCOUNTER — Other Ambulatory Visit (INDEPENDENT_AMBULATORY_CARE_PROVIDER_SITE_OTHER): Payer: Self-pay | Admitting: *Deleted

## 2011-04-16 DIAGNOSIS — K3184 Gastroparesis: Secondary | ICD-10-CM

## 2011-04-16 DIAGNOSIS — K219 Gastro-esophageal reflux disease without esophagitis: Secondary | ICD-10-CM

## 2011-04-16 MED ORDER — OMEPRAZOLE 40 MG PO CPDR: DELAYED_RELEASE_CAPSULE | ORAL | Status: DC

## 2011-04-16 NOTE — Telephone Encounter (Signed)
Fax request from Pharmacy for refill , last filed 02-08-2011

## 2011-04-16 NOTE — Telephone Encounter (Signed)
Addended by: Len Blalock on: 04/16/2011 02:21 PM   Modules accepted: Orders

## 2011-04-24 ENCOUNTER — Encounter (INDEPENDENT_AMBULATORY_CARE_PROVIDER_SITE_OTHER): Payer: Self-pay | Admitting: *Deleted

## 2011-05-05 ENCOUNTER — Emergency Department (HOSPITAL_COMMUNITY)
Admission: EM | Admit: 2011-05-05 | Discharge: 2011-05-05 | Disposition: A | Discharge status: Home / Self Care | Payer: Medicare Other | Attending: Emergency Medicine | Admitting: Emergency Medicine

## 2011-05-05 ENCOUNTER — Encounter (HOSPITAL_COMMUNITY): Payer: Self-pay | Admitting: *Deleted

## 2011-05-05 ENCOUNTER — Emergency Department (HOSPITAL_COMMUNITY): Payer: Medicare Other

## 2011-05-05 DIAGNOSIS — R197 Diarrhea, unspecified: Secondary | ICD-10-CM | POA: Insufficient documentation

## 2011-05-05 DIAGNOSIS — E119 Type 2 diabetes mellitus without complications: Secondary | ICD-10-CM | POA: Insufficient documentation

## 2011-05-05 DIAGNOSIS — N390 Urinary tract infection, site not specified: Secondary | ICD-10-CM

## 2011-05-05 DIAGNOSIS — R109 Unspecified abdominal pain: Secondary | ICD-10-CM | POA: Insufficient documentation

## 2011-05-05 DIAGNOSIS — R112 Nausea with vomiting, unspecified: Secondary | ICD-10-CM | POA: Insufficient documentation

## 2011-05-05 DIAGNOSIS — Z794 Long term (current) use of insulin: Secondary | ICD-10-CM | POA: Insufficient documentation

## 2011-05-05 DIAGNOSIS — E785 Hyperlipidemia, unspecified: Secondary | ICD-10-CM | POA: Insufficient documentation

## 2011-05-05 LAB — CBC
HCT: 37 % (ref 36.0–46.0)
MCH: 34.5 pg — ABNORMAL HIGH (ref 26.0–34.0)
MCHC: 32.7 g/dL (ref 30.0–36.0)
RDW: 16.9 % — ABNORMAL HIGH (ref 11.5–15.5)

## 2011-05-05 LAB — GLUCOSE, CAPILLARY: Glucose-Capillary: 284 mg/dL — ABNORMAL HIGH (ref 70–99)

## 2011-05-05 LAB — COMPREHENSIVE METABOLIC PANEL
AST: 18 U/L (ref 0–37)
Albumin: 3 g/dL — ABNORMAL LOW (ref 3.5–5.2)
Calcium: 10.2 mg/dL (ref 8.4–10.5)
Chloride: 93 mEq/L — ABNORMAL LOW (ref 96–112)
Creatinine, Ser: 4.9 mg/dL — ABNORMAL HIGH (ref 0.50–1.10)
Total Bilirubin: 0.2 mg/dL — ABNORMAL LOW (ref 0.3–1.2)
Total Protein: 6.7 g/dL (ref 6.0–8.3)

## 2011-05-05 LAB — DIFFERENTIAL
Basophils Absolute: 0.1 10*3/uL (ref 0.0–0.1)
Basophils Relative: 1 % (ref 0–1)
Eosinophils Absolute: 0.1 10*3/uL (ref 0.0–0.7)
Monocytes Absolute: 0.7 10*3/uL (ref 0.1–1.0)
Neutro Abs: 8.8 10*3/uL — ABNORMAL HIGH (ref 1.7–7.7)

## 2011-05-05 LAB — URINALYSIS, ROUTINE W REFLEX MICROSCOPIC
Glucose, UA: NEGATIVE mg/dL
Nitrite: NEGATIVE
Specific Gravity, Urine: >1.03 — ABNORMAL HIGH (ref 1.005–1.030)
pH: 5.5 (ref 5.0–8.0)

## 2011-05-05 LAB — URINE MICROSCOPIC-ADD ON

## 2011-05-05 LAB — LACTIC ACID, PLASMA: Lactic Acid, Venous: 1.1 mmol/L (ref 0.5–2.2)

## 2011-05-05 LAB — LIPASE, BLOOD: Lipase: 13 U/L (ref 11–59)

## 2011-05-05 MED ORDER — ONDANSETRON 8 MG PO TBDP: 8.0000 mg | ORAL_TABLET | Freq: Three times a day (TID) | ORAL | Status: AC | PRN

## 2011-05-05 MED ORDER — OXYCODONE-ACETAMINOPHEN 5-325 MG PO TABS
2.0000 | ORAL_TABLET | Freq: Once | ORAL | Status: AC
  Administered 2011-05-05: 2 via ORAL
  Filled 2011-05-05: qty 2

## 2011-05-05 MED ORDER — SODIUM CHLORIDE 0.9 % IV BOLUS (SEPSIS)
1000.0000 mL | Freq: Once | INTRAVENOUS | Status: AC
  Administered 2011-05-05: 1000 mL via INTRAVENOUS

## 2011-05-05 MED ORDER — CEPHALEXIN 500 MG PO CAPS: 500.0000 mg | ORAL_CAPSULE | Freq: Four times a day (QID) | ORAL | Status: AC

## 2011-05-05 MED ORDER — CEPHALEXIN 500 MG PO CAPS
500.0000 mg | ORAL_CAPSULE | Freq: Once | ORAL | Status: AC
  Administered 2011-05-05: 500 mg via ORAL
  Filled 2011-05-05: qty 1

## 2011-05-05 MED ORDER — ONDANSETRON 8 MG PO TBDP
8.0000 mg | ORAL_TABLET | Freq: Once | ORAL | Status: AC
  Administered 2011-05-05: 8 mg via ORAL
  Filled 2011-05-05: qty 1

## 2011-05-05 MED ORDER — OXYCODONE-ACETAMINOPHEN 5-325 MG PO TABS: 2.0000 | ORAL_TABLET | Freq: Four times a day (QID) | ORAL | Status: AC | PRN

## 2011-05-05 MED ORDER — ONDANSETRON HCL 4 MG/2ML IJ SOLN
4.0000 mg | Freq: Once | INTRAMUSCULAR | Status: AC
  Administered 2011-05-05: 4 mg via INTRAVENOUS
  Filled 2011-05-05: qty 2

## 2011-05-05 NOTE — ED Provider Notes (Signed)
Scribed for Cyndra Numbers, MD, the patient was seen in room APA10/APA10. This chart was scribed by [employer]. The patient's care started at 10:12  CSN: [id number] Arrival date & time: 05/05/2011  9:44 AM  Chief Complaint  Patient presents with  . Abdominal Pain   HPI [name] is a 40 y.o. female who presents to the Emergency Department complaining of 10/10 worsening epigastric pain onset 05/03/2011 with associated nausea, vomiting, diarrhea and constipation. Per mother, patient has not had anything to eat since Thursday. Last bowel movement was on Wednesday. Patient is still nauseous but not vomiting at this time. Patient is on dialysis and has had no treatment since Thursday. Patient denies dysuria, hematuria, vaginal discharge or bleeding. Denies a history of osteomyelitis, sepsis or stroke. Patient is alert and oriented to person but not to time and place.  Appears confused. History of cholecystectomy. There are no other associated symptoms and no other alleviating or aggravating factors.  HPI ELEMENTS:  Location: Abdomen  Onset: 05/03/2011 Duration: 3 days  Timing: worsening Severity: 10/10  Context:  as above  Associated symptoms: as above    Past Medical History  Diagnosis Date  . DVT (deep venous thrombosis)     DVT HISTORY  . Renal failure   . Diabetes mellitus   . Migraine     HISTORY  . Anxiety and depression   . CAD (coronary artery disease)   . Bipolar 1 disorder   . Nephrotic syndrome     IN THE PAST  . Hyperlipidemia   . Hyperparathyroidism   . PE (pulmonary embolism)     HISTORY  . Dialysis patient   . Anemia    MEDICATIONS:  Previous Medications   ALBUTEROL (PROVENTIL HFA;VENTOLIN HFA) 108 (90 BASE) MCG/ACT INHALER    Inhale 2 puffs into the lungs every 6 (six) hours as needed. Shortness of breath     ALPRAZOLAM (XANAX) 1 MG TABLET    Take 1 mg by mouth 4 (four) times daily.     AMLODIPINE (NORVASC) 10 MG TABLET    Take 10 mg by mouth daily.     B COMPLEX-VITAMIN C-FOLIC ACID (NEPHRO-VITE) 0.8 MG TABS    Take 0.8 mg by mouth at bedtime.     BUTALBITAL-ACETAMINOPHEN-CAFFEINE (FIORICET, ESGIC) 50-325-40 MG PER TABLET    Take 1 tablet by mouth every 6 (six) hours as needed. For pain   CINACALCET (SENSIPAR) 30 MG TABLET    Take 30 mg by mouth daily.     CITALOPRAM (CELEXA) 40 MG TABLET    Take 40 mg by mouth daily.     CLONIDINE (CATAPRES) 0.2 MG TABLET    Take 0.2 mg by mouth 2 (two) times daily.     CLONIDINE (CATAPRES) 0.3 MG TABLET    Take 0.3 mg by mouth 2 (two) times daily.     CLOPIDOGREL (PLAVIX) 75 MG TABLET    Take 75 mg by mouth daily.     CYCLOSPORINE (RESTASIS) 0.05 % OPHTHALMIC EMULSION    Place 1 drop into both eyes 2 (two) times daily.     ESOMEPRAZOLE (NEXIUM) 40 MG CAPSULE    Take 40 mg by mouth 2 (two) times daily.     HYDROCODONE-ACETAMINOPHEN (LORTAB) 10-500 MG PER TABLET    Take 1 tablet by mouth as needed.     INSULIN GLARGINE (LANTUS) 100 UNIT/ML INJECTION    Inject 30 Units into the skin 2 (two) times daily.     INSULIN GLARGINE (LANTUS) 100  UNIT/ML INJECTION    Inject 50 Units into the skin at bedtime.    INSULIN LISPRO (HUMALOG) 100 UNIT/ML INJECTION    Inject 4-20 Units into the skin 4 (four) times daily -  before meals and at bedtime. TAKING ON SLIDING SCALE 200-4 untis 250=8 units 300=12 units 350=16 units 400=20 units More than 400 call DR.    LISINOPRIL (PRINIVIL,ZESTRIL) 10 MG TABLET    Take 10 mg by mouth daily.     LISINOPRIL (PRINIVIL,ZESTRIL) 40 MG TABLET    Take 40 mg by mouth daily.     METOCLOPRAMIDE (REGLAN) 10 MG TABLET    Take 20 mg by mouth 4 (four) times daily.     METOLAZONE (ZAROXOLYN) 5 MG TABLET    Take 5 mg by mouth 2 (two) times daily.     NABUMETONE (RELAFEN) 500 MG TABLET    Take 2,400 mg by mouth 4 (four) times daily.     OLOPATADINE (PATANOL) 0.1 % OPHTHALMIC SOLUTION    Place 1 drop into both eyes 2 (two) times daily.     OMEPRAZOLE (PRILOSEC) 20 MG CAPSULE    Take 20 mg by mouth  2 (two) times daily.     OMEPRAZOLE (PRILOSEC) 40 MG CAPSULE    Take (1) Capsule Twice Daily 30 Minutes Before Breakfast and 30 Minutes Before Supper   OXYCODONE-ACETAMINOPHEN (PERCOCET) 10-325 MG PER TABLET    Take 1 tablet by mouth every 4 (four) hours as needed. For pain    PAROXETINE (PAXIL) 40 MG TABLET    Take 80 mg by mouth every morning.     PROMETHAZINE (PHENERGAN) 25 MG TABLET    Take 25 mg by mouth every 6 (six) hours as needed. nausea   SEVELAMER (RENVELA) 800 MG TABLET    Take 4,000 mg by mouth 3 (three) times daily with meals.     SODIUM BICARBONATE 650 MG TABLET    Take 650 mg by mouth 2 (two) times daily.     SUCRALFATE (CARAFATE) 1 G TABLET    Take 1 g by mouth 2 (two) times daily before a meal.    TORSEMIDE (DEMADEX) 100 MG TABLET    Take 100 mg by mouth daily.     TRAZODONE (DESYREL) 100 MG TABLET    Take 100 mg by mouth at bedtime.     VALSARTAN-HYDROCHLOROTHIAZIDE (DIOVAN-HCT) 320-12.5 MG PER TABLET    Take 1 tablet by mouth daily.       ALLERGIES:  Allergies as of 05/05/2011 - Review Complete 05/05/2011  Allergen Reaction Noted  . Meperidine hcl Swelling   . Morphine Swelling   . Nubain (nalbuphine hcl) Swelling 04/15/2011  . Toradol (ketorolac tromethamine) Swelling 04/15/2011      Past Surgical History  Procedure Date  . Back surgery X 4  . Cholecystectomy   . Incise and drain abcess     OF THIGHS FROM INSULIN INJECTIONS  . Heart stents     3 - 4  STENTS PLACED  . Portacath placement 2003    History reviewed. No pertinent family history.  History  Substance Use Topics  . Smoking status: Current Everyday Smoker -- 1.5 packs/day  . Smokeless tobacco: Not on file  . Alcohol Use: No    OB History    Grav Para Term Preterm Abortions TAB SAB Ect Mult Living                  Review of Systems  Constitutional: Positive for chills and appetite change.  Gastrointestinal: Positive for nausea, vomiting, diarrhea and constipation.  Genitourinary: Negative  for dysuria, hematuria, vaginal bleeding, vaginal discharge and menstrual problem.  Psychiatric/Behavioral: Positive for confusion.  All other systems reviewed and are negative.    Physical Exam  BP 198/83  Pulse 93  Temp(Src) 98.8 F (37.1 C) (Oral)  Resp 18  Ht 5' 10.5" (1.791 m)  Wt 180 lb (81.647 kg)  BMI 25.46 kg/m2  SpO2 93%  Physical Exam  Nursing note and vitals reviewed. Constitutional: She appears well-developed and well-nourished.  HENT:  Head: Normocephalic and atraumatic.  Mouth/Throat: Oropharynx is clear and moist.  Eyes: Conjunctivae are normal. Pupils are equal, round, and reactive to light.  Neck: Normal range of motion. Neck supple. No tracheal deviation present.  Cardiovascular: Normal rate, regular rhythm and normal heart sounds.   Pulmonary/Chest: Effort normal and breath sounds normal. No respiratory distress. She has no wheezes. She has no rales.  Abdominal: Soft. Bowel sounds are normal. There is tenderness (epigastric). There is no rebound and no guarding.  Genitourinary: No vaginal discharge found.  Lymphadenopathy:    She has no cervical adenopathy.  Neurological: No cranial nerve deficit.       Patient is alert to person, but not to time and place  Skin: Skin is warm and dry. No rash noted. She is not diaphoretic. No erythema.  Psychiatric:       Confused     ED Course  Procedures  OTHER DATA REVIEWED: Nursing notes, vital signs, and past medical records reviewed.    DIAGNOSTIC STUDIES: Oxygen Saturation is 93% on room air, normal by my interpretation.    LABS / RADIOLOGY:  Results for orders placed during the hospital encounter of 05/05/11  CBC      Component Value Range   WBC 10.8 (*) 4.0 - 10.5 (K/uL)   RBC 3.51 (*) 3.87 - 5.11 (MIL/uL)   Hemoglobin 12.1  12.0 - 15.0 (g/dL)   HCT 16.1  09.6 - 04.5 (%)   MCV 105.4 (*) 78.0 - 100.0 (fL)   MCH 34.5 (*) 26.0 - 34.0 (pg)   MCHC 32.7  30.0 - 36.0 (g/dL)   RDW 40.9 (*) 81.1 - 15.5  (%)   Platelets 281  150 - 400 (K/uL)  DIFFERENTIAL      Component Value Range   Neutrophils Relative 82 (*) 43 - 77 (%)   Neutro Abs 8.8 (*) 1.7 - 7.7 (K/uL)   Lymphocytes Relative 11 (*) 12 - 46 (%)   Lymphs Abs 1.1  0.7 - 4.0 (K/uL)   Monocytes Relative 6  3 - 12 (%)   Monocytes Absolute 0.7  0.1 - 1.0 (K/uL)   Eosinophils Relative 1  0 - 5 (%)   Eosinophils Absolute 0.1  0.0 - 0.7 (K/uL)   Basophils Relative 1  0 - 1 (%)   Basophils Absolute 0.1  0.0 - 0.1 (K/uL)  COMPREHENSIVE METABOLIC PANEL      Component Value Range   Sodium 132 (*) 135 - 145 (mEq/L)   Potassium 3.2 (*) 3.5 - 5.1 (mEq/L)   Chloride 93 (*) 96 - 112 (mEq/L)   CO2 27  19 - 32 (mEq/L)   Glucose, Bld 269 (*) 70 - 99 (mg/dL)   BUN 29 (*) 6 - 23 (mg/dL)   Creatinine, Ser 9.14 (*) 0.50 - 1.10 (mg/dL)   Calcium 78.2  8.4 - 10.5 (mg/dL)   Total Protein 6.7  6.0 - 8.3 (g/dL)   Albumin 3.0 (*) 3.5 -  5.2 (g/dL)   AST 18  0 - 37 (U/L)   ALT 19  0 - 35 (U/L)   Alkaline Phosphatase 198 (*) 39 - 117 (U/L)   Total Bilirubin 0.2 (*) 0.3 - 1.2 (mg/dL)   GFR calc non Af Amer 10 (*) >60 (mL/min)   GFR calc Af Amer 12 (*) >60 (mL/min)  LIPASE, BLOOD      Component Value Range   Lipase 13  11 - 59 (U/L)  LACTIC ACID, PLASMA      Component Value Range   Lactic Acid, Venous 1.1  0.5 - 2.2 (mmol/L)  GLUCOSE, CAPILLARY      Component Value Range   Glucose-Capillary 284 (*) 70 - 99 (mg/dL)     ED COURSE / COORDINATION OF CARE: 10:30 - EDMD examined patient and ordered the following Orders Placed This Encounter  Procedures  . CBC  . Differential  . Comprehensive metabolic panel  . Lipase, blood  . Urinalysis with microscopic  . Lactic acid, plasma     MDM: Patient was evaluated and had very strange affect.  She stared off into space and her mother answered many questions for her.  The patient has been laying in bed for the past 2 days and not taking any of her medications.  Here she is not actively vomiting.  She has  a history of diabetic gastroparesis and has taken none of her meds for this.  Patient is on dialysis and her renal function was at its baseline.  She actually had mild hypokalemia today.  Patient had elevated glucose but no anion gap.  Her lipase and liver function were unremarkable. Her UTI was + for a UTI and she denied any vaginal discharge.  PAtient was treated symptomatically and given 1 L of NS.  She was given keflex for her UTI.  Patient tolerated oral medications and was able to ambulate around the department.  I spoke with the MD on call for the patient's physician.  I described that following all of the treatments that the patient felt better and was able to tolerate po.  She also said she would take all of her medications.  Patient would answer all of my questions but would repeatedly report the year incorrectly prior to discharge.  She knew the answer to everything else she was asked.  Her vitals were improved prior to discharge and patient and family were comfortable with d/c home. IMPRESSION: Diagnoses that have been ruled out:  Diagnoses that are still under consideration:  Final diagnoses:    PLAN:  Home Narcotic pain medication and Advised to return for worsening or additional problems such as abdominal or chest pain Diagnostic tests were reviewed and questions answered. Diagnosis, care plan and treatment options were discussed. The patient understand instructions and will follow up as directed. Condition improved The patient is to return the emergency department if there is any worsening of symptoms. I have reviewed the discharge instructions with the patientfamily.  CONDITION ON DISCHARGE: Good  MEDICATIONS GIVEN IN THE E.D.  Medications  oxyCODONE-acetaminophen (PERCOCET) 10-325 MG per tablet (not administered)  ALPRAZolam (XANAX) 1 MG tablet (not administered)  ondansetron (ZOFRAN) injection 4 mg (not administered)  sodium chloride 0.9 % bolus 1,000 mL (not administered)      DISCHARGE MEDICATIONS: New Prescriptions   No medications on file    SCRIBE ATTESTATION: I personally performed the services described in this documentation, which was scribed in my presence. The recorded information has been reviewed and considered.  Cyndra Numbers, MD         Cyndra Numbers, MD 05/09/11 [phone]

## 2011-05-05 NOTE — ED Notes (Signed)
Pt ambulated to restroom at this time but forgot  to get urine sample at this time. [name]

## 2011-05-05 NOTE — ED Notes (Signed)
Dr. Charm Barges (covering Dr. Margo Common )paged for EDP.

## 2011-05-05 NOTE — ED Notes (Signed)
Pt c/o nausea, vomiting, and abdominal pain since Thursday. Pt alert and oriented to person and place but not time. Pt states that she is unable to take her medications or eat and drink. Pt is on dialysis and had full treatment on Thursday but did not go this am. Pt has no active vomiting at this time.

## 2011-05-05 NOTE — ED Notes (Signed)
Pt alert and oriented to person and place but not time. Pt has swelling to her face, hands, legs and feet. Pt did not go to dialysis this am. Breath sounds clear and equal bilaterally.

## 2011-05-05 NOTE — ED Notes (Signed)
Pt up and walked to bathroom without difficulty.

## 2011-05-07 LAB — URINE CULTURE

## 2011-05-08 ENCOUNTER — Encounter (HOSPITAL_COMMUNITY): Payer: Self-pay | Admitting: *Deleted

## 2011-05-08 NOTE — ED Notes (Signed)
Results rcvd from [facility].  >/= 100,000 colonies -> Yeast.  Rx for Keflex given in ED.  Chart to MD office for review.

## 2011-05-16 ENCOUNTER — Other Ambulatory Visit: Payer: Self-pay | Admitting: Medical

## 2011-05-18 ENCOUNTER — Encounter (HOSPITAL_COMMUNITY): Payer: Medicare Other

## 2011-05-18 LAB — COMPREHENSIVE METABOLIC PANEL
AST: 27
Albumin: 2.2 — ABNORMAL LOW
Alkaline Phosphatase: 190 — ABNORMAL HIGH
BUN: 16
Chloride: 93 — ABNORMAL LOW
Potassium: 4.1
Total Bilirubin: 0.3

## 2011-05-18 LAB — HEMOGLOBIN A1C: Mean Plasma Glucose: 282

## 2011-05-21 LAB — HEPATIC FUNCTION PANEL
ALT: 22
Alkaline Phosphatase: 172 — ABNORMAL HIGH
Bilirubin, Direct: <0.1

## 2011-05-21 LAB — BASIC METABOLIC PANEL
Chloride: 98
GFR calc Af Amer: >60
Potassium: 4.1

## 2011-05-21 LAB — CBC
HCT: 39.2
Hemoglobin: 13.9
MCV: 89.4
RBC: 4.38
WBC: 10.6 — ABNORMAL HIGH

## 2011-05-21 LAB — POCT CARDIAC MARKERS: Troponin i, poc: <0.05

## 2011-05-21 LAB — LIPASE, BLOOD: Lipase: 12

## 2011-05-22 LAB — CBC
Hemoglobin: 10.6 — ABNORMAL LOW
Hemoglobin: 12.9
MCHC: 35.2
MCHC: 35.7
Platelets: 257
Platelets: 261
RBC: 3.3 — ABNORMAL LOW
RDW: 13.9
RDW: 14.1
RDW: 14.3

## 2011-05-22 LAB — BASIC METABOLIC PANEL
BUN: 14
CO2: 25
CO2: 27
CO2: 29
Calcium: 8.1 — ABNORMAL LOW
Calcium: 8.4
Calcium: 8.7
Calcium: 9.2
Creatinine, Ser: 0.97
Creatinine, Ser: 1.09
Creatinine, Ser: 1.2
Creatinine, Ser: 1.23 — ABNORMAL HIGH
GFR calc Af Amer: 59 — ABNORMAL LOW
GFR calc Af Amer: >60
GFR calc non Af Amer: 49 — ABNORMAL LOW
GFR calc non Af Amer: 50 — ABNORMAL LOW
GFR calc non Af Amer: >60
Glucose, Bld: 171 — ABNORMAL HIGH
Glucose, Bld: 297 — ABNORMAL HIGH
Glucose, Bld: 322 — ABNORMAL HIGH
Glucose, Bld: 489 — ABNORMAL HIGH
Sodium: 130 — ABNORMAL LOW
Sodium: 133 — ABNORMAL LOW
Sodium: 136
Sodium: 136

## 2011-05-22 LAB — CK TOTAL AND CKMB (NOT AT ARMC)
CK, MB: 1.8
Relative Index: INVALID
Total CK: 17

## 2011-05-22 LAB — POCT I-STAT 3, VENOUS BLOOD GAS (G3P V)
Acid-Base Excess: 4 — ABNORMAL HIGH
O2 Saturation: 67
Operator id: 113391

## 2011-05-22 LAB — COMPREHENSIVE METABOLIC PANEL
Alkaline Phosphatase: 193 — ABNORMAL HIGH
BUN: 17
Calcium: 9.4
GFR calc non Af Amer: >60
Glucose, Bld: 459 — ABNORMAL HIGH
Total Protein: 5.1 — ABNORMAL LOW

## 2011-05-22 LAB — PROTIME-INR
INR: 0.9
INR: 0.9
Prothrombin Time: 12.5
Prothrombin Time: 12.6

## 2011-05-22 LAB — HEMOGLOBIN A1C
Hgb A1c MFr Bld: 11 — ABNORMAL HIGH
Mean Plasma Glucose: 314

## 2011-05-22 LAB — POCT I-STAT 3, ART BLOOD GAS (G3+)
Acid-Base Excess: 6 — ABNORMAL HIGH
O2 Saturation: 94
Operator id: 113391
pO2, Arterial: 64 — ABNORMAL LOW

## 2011-05-22 LAB — APTT: aPTT: 27

## 2011-05-25 LAB — URINALYSIS, ROUTINE W REFLEX MICROSCOPIC
Bilirubin Urine: NEGATIVE
Glucose, UA: >1000 — AB
Glucose, UA: >1000 — AB
Leukocytes, UA: NEGATIVE
Specific Gravity, Urine: 1.02
Urobilinogen, UA: 0.2
pH: 7

## 2011-05-25 LAB — BASIC METABOLIC PANEL
BUN: 12
BUN: 13
CO2: 27
CO2: 24
CO2: 24
CO2: 24
Calcium: 9.4
Chloride: 102
Chloride: 106
Chloride: 112
GFR calc Af Amer: >60
GFR calc non Af Amer: 59 — ABNORMAL LOW
GFR calc non Af Amer: 39 — ABNORMAL LOW
GFR calc non Af Amer: 59 — ABNORMAL LOW
Glucose, Bld: 272 — ABNORMAL HIGH
Glucose, Bld: 349 — ABNORMAL HIGH
Potassium: 3 — ABNORMAL LOW
Potassium: 3.3 — ABNORMAL LOW
Potassium: 3.7
Sodium: 136
Sodium: 132 — ABNORMAL LOW
Sodium: 135

## 2011-05-25 LAB — COMPREHENSIVE METABOLIC PANEL
AST: 28
Albumin: 2.3 — ABNORMAL LOW
Alkaline Phosphatase: 179 — ABNORMAL HIGH
BUN: 13
Chloride: 99
Creatinine, Ser: 1.2
GFR calc Af Amer: >60
Potassium: 4.2
Total Bilirubin: 0.9
Total Protein: 5.9 — ABNORMAL LOW

## 2011-05-25 LAB — DIFFERENTIAL
Basophils Absolute: 0
Eosinophils Relative: 1
Lymphocytes Relative: 7 — ABNORMAL LOW
Lymphocytes Relative: 13
Lymphs Abs: 0.8
Monocytes Absolute: 0.5
Monocytes Absolute: 0.6
Monocytes Relative: 5
Monocytes Relative: 6
Neutro Abs: 9.6 — ABNORMAL HIGH
Neutro Abs: 8.6 — ABNORMAL HIGH
Neutrophils Relative %: 88 — ABNORMAL HIGH

## 2011-05-25 LAB — CBC
Hemoglobin: 14.3
MCHC: 34.7
Platelets: 320
RBC: 4.54
RDW: 14.4
WBC: 10.9 — ABNORMAL HIGH
WBC: 10.8 — ABNORMAL HIGH

## 2011-05-25 LAB — URINE CULTURE

## 2011-05-25 LAB — POCT CARDIAC MARKERS
CKMB, poc: 5.9
Myoglobin, poc: 121
Operator id: 270681
Troponin i, poc: <0.05

## 2011-05-25 LAB — URINE MICROSCOPIC-ADD ON

## 2011-05-25 LAB — KETONES, QUALITATIVE

## 2011-05-28 LAB — CBC
HCT: 35 — ABNORMAL LOW
HCT: 29.3 — ABNORMAL LOW
Hemoglobin: 12.1
Hemoglobin: 10.1 — ABNORMAL LOW
MCHC: 34.7
MCHC: 34.5
MCV: 91.8
Platelets: 306
RBC: 3.82 — ABNORMAL LOW
RBC: 3.2 — ABNORMAL LOW
RDW: 14.6
RDW: 15.1
WBC: 8.6

## 2011-05-28 LAB — DIFFERENTIAL
Basophils Absolute: 0
Basophils Relative: 1
Basophils Relative: 0
Eosinophils Absolute: 0.1
Eosinophils Absolute: 0.1
Eosinophils Relative: 1
Lymphocytes Relative: 16
Lymphs Abs: 1.4
Monocytes Absolute: 0.6
Monocytes Relative: 7
Neutro Abs: 6.4
Neutro Abs: 10.3 — ABNORMAL HIGH
Neutrophils Relative %: 75
Neutrophils Relative %: 77

## 2011-05-28 LAB — URINALYSIS, ROUTINE W REFLEX MICROSCOPIC
Glucose, UA: 500 — AB
Leukocytes, UA: NEGATIVE
Nitrite: NEGATIVE
Protein, ur: >300 — AB
Specific Gravity, Urine: 1.025
Urobilinogen, UA: 0.2
pH: 6

## 2011-05-28 LAB — POCT CARDIAC MARKERS
CKMB, poc: 3.5
Myoglobin, poc: 169

## 2011-05-28 LAB — BASIC METABOLIC PANEL
BUN: 18
Calcium: 7.5 — ABNORMAL LOW
Chloride: 107
Creatinine, Ser: 1.85 — ABNORMAL HIGH
GFR calc non Af Amer: 31 — ABNORMAL LOW

## 2011-05-28 LAB — COMPREHENSIVE METABOLIC PANEL
ALT: 22
Alkaline Phosphatase: 168 — ABNORMAL HIGH
BUN: 11
BUN: 9
CO2: 26
CO2: 24
Calcium: 9.6
Chloride: 106
Creatinine, Ser: 1.06
GFR calc non Af Amer: 58 — ABNORMAL LOW
GFR calc non Af Amer: >60
Glucose, Bld: 118 — ABNORMAL HIGH
Potassium: 3.2 — ABNORMAL LOW
Sodium: 139
Total Bilirubin: 0.4

## 2011-05-28 LAB — GLUCOSE, CAPILLARY
Glucose-Capillary: 238 — ABNORMAL HIGH
Glucose-Capillary: 242 — ABNORMAL HIGH
Glucose-Capillary: 359 — ABNORMAL HIGH

## 2011-05-28 LAB — URINE MICROSCOPIC-ADD ON

## 2011-05-28 LAB — LIPASE, BLOOD
Lipase: 11
Lipase: 18

## 2011-05-30 LAB — COMPREHENSIVE METABOLIC PANEL
ALT: 22
Albumin: 2.1 — ABNORMAL LOW
Albumin: 2.1 — ABNORMAL LOW
Alkaline Phosphatase: 144 — ABNORMAL HIGH
Alkaline Phosphatase: 166 — ABNORMAL HIGH
BUN: 10
Calcium: 9.1
Chloride: 110
Creatinine, Ser: 0.9
GFR calc Af Amer: 58 — ABNORMAL LOW
GFR calc non Af Amer: >60
Glucose, Bld: 189 — ABNORMAL HIGH
Glucose, Bld: 246 — ABNORMAL HIGH
Potassium: 3.2 — ABNORMAL LOW
Potassium: 3.6
Sodium: 138
Total Bilirubin: 0.5
Total Protein: 5.1 — ABNORMAL LOW

## 2011-05-30 LAB — CBC
HCT: 42.9
Hemoglobin: 12.3
Hemoglobin: 14.9
MCHC: 35.7
MCV: 91.8
Platelets: 284
Platelets: 297
RBC: 4.68
RDW: 14.7

## 2011-05-30 LAB — GLUCOSE, CAPILLARY: Glucose-Capillary: 233 — ABNORMAL HIGH

## 2011-05-30 LAB — URINALYSIS, ROUTINE W REFLEX MICROSCOPIC
Glucose, UA: 500 — AB
Ketones, ur: NEGATIVE
Leukocytes, UA: NEGATIVE
pH: 7.5

## 2011-05-30 LAB — DIFFERENTIAL
Basophils Absolute: 0
Basophils Relative: 0
Eosinophils Absolute: 0.2
Lymphocytes Relative: 11 — ABNORMAL LOW
Lymphs Abs: 2.1
Monocytes Absolute: 0.6
Monocytes Relative: 7
Neutro Abs: 6.5
Neutro Abs: 7.1
Neutrophils Relative %: 69
Neutrophils Relative %: 83 — ABNORMAL HIGH

## 2011-05-30 LAB — URINE MICROSCOPIC-ADD ON

## 2011-05-30 LAB — LIPASE, BLOOD: Lipase: 16

## 2011-05-31 LAB — CBC
HCT: 33.8 % — ABNORMAL LOW (ref 36.0–46.0)
MCV: 87.4 fL (ref 78.0–100.0)
Platelets: 352 10*3/uL (ref 150–400)
RDW: 15.2 % (ref 11.5–15.5)

## 2011-05-31 LAB — COMPREHENSIVE METABOLIC PANEL
AST: 11 U/L (ref 0–37)
Albumin: 1.7 g/dL — ABNORMAL LOW (ref 3.5–5.2)
BUN: 15 mg/dL (ref 6–23)
Creatinine, Ser: 1.3 mg/dL — ABNORMAL HIGH (ref 0.4–1.2)
GFR calc Af Amer: 55 mL/min — ABNORMAL LOW (ref >60)
Total Protein: 5.2 g/dL — ABNORMAL LOW (ref 6.0–8.3)

## 2011-05-31 LAB — HEMOGLOBIN A1C: Mean Plasma Glucose: 174 mg/dL

## 2011-05-31 LAB — TSH: TSH: 3.489 u[IU]/mL (ref 0.350–4.500)

## 2011-06-01 LAB — CBC
HCT: 41.6
Hemoglobin: 14.4
Platelets: 300
RBC: 4.6
WBC: 9

## 2011-06-01 LAB — DIFFERENTIAL
Eosinophils Relative: 3
Lymphocytes Relative: 20
Lymphs Abs: 1.8
Monocytes Relative: 6

## 2011-06-01 LAB — POCT CARDIAC MARKERS
Operator id: 216151
Troponin i, poc: <0.05

## 2011-06-01 LAB — BASIC METABOLIC PANEL
BUN: 10
GFR calc Af Amer: >60
GFR calc non Af Amer: >60
Potassium: 3.4 — ABNORMAL LOW
Sodium: 131 — ABNORMAL LOW

## 2011-06-04 LAB — COMPREHENSIVE METABOLIC PANEL
AST: 21
Albumin: 1.9 — ABNORMAL LOW
Alkaline Phosphatase: 194 — ABNORMAL HIGH
BUN: 7
Chloride: 100
GFR calc Af Amer: >60
Potassium: 3.1 — ABNORMAL LOW
Total Bilirubin: 0.4

## 2011-06-04 LAB — CBC
HCT: 41.8
Platelets: 314
WBC: 12.2 — ABNORMAL HIGH

## 2011-06-04 LAB — LIPID PANEL
HDL: 61
Total CHOL/HDL Ratio: 6
Triglycerides: 528 — ABNORMAL HIGH
VLDL: UNDETERMINED

## 2011-06-07 IMAGING — CR DG ABDOMEN ACUTE W/ 1V CHEST
3 series · 3 of 3 positions shown · non-contrast
Comparison: abdomen film 02/24/2010

CLINICAL DATA: Nausea and vomiting

ACUTE ABDOMEN SERIES (ABDOMEN 2 VIEW & CHEST 1 VIEW)

[view not recorded (1 of 3)]
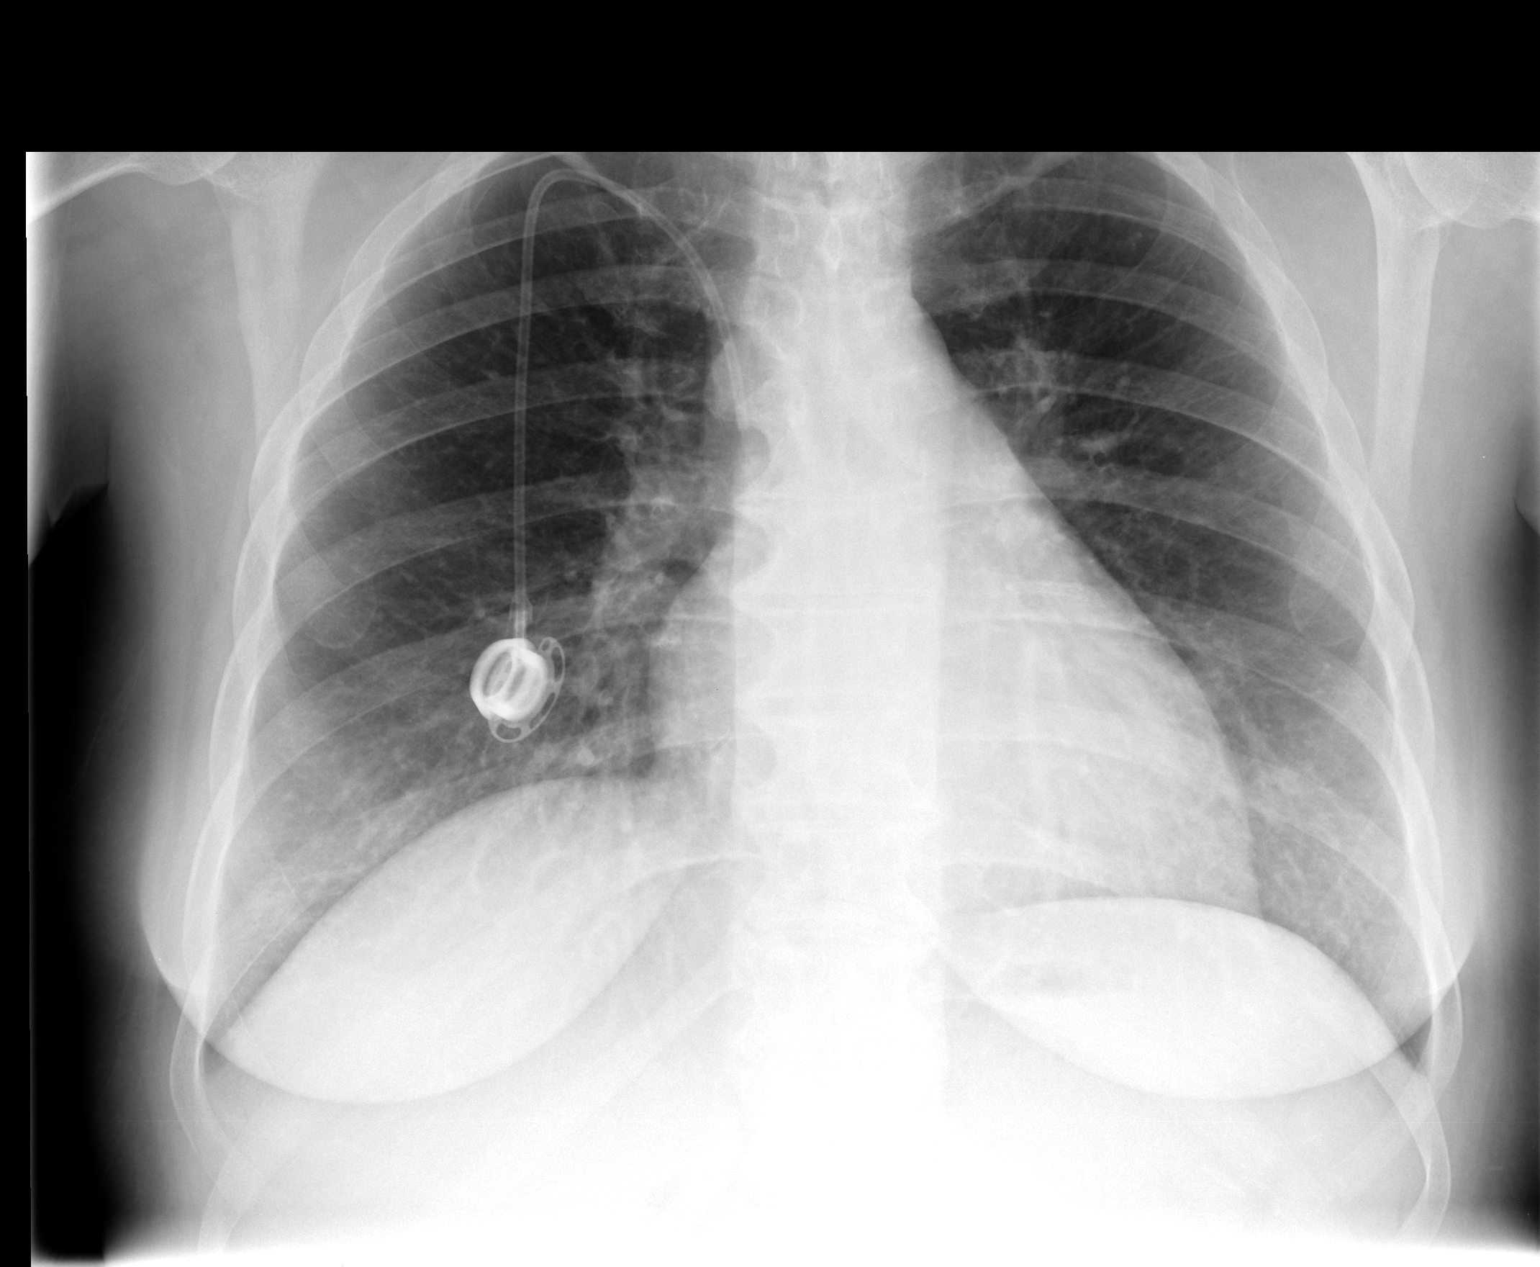

[view not recorded (2 of 3)]
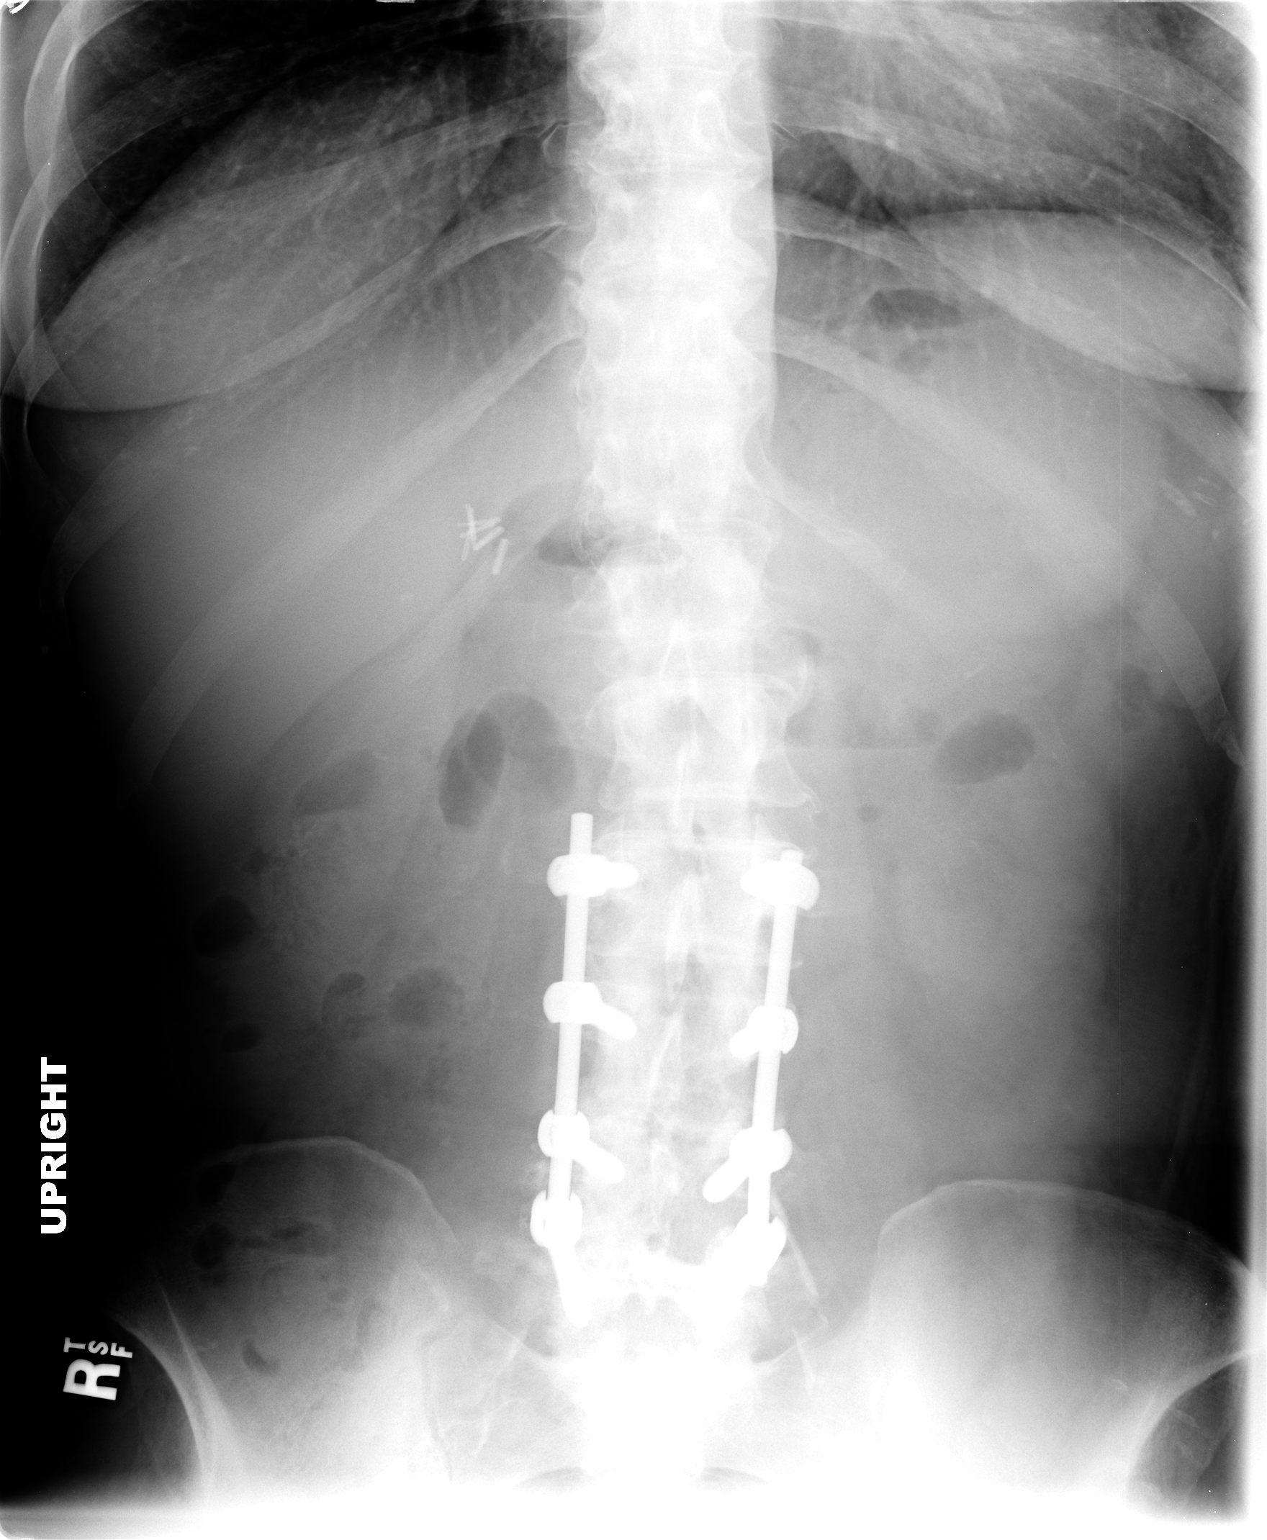

[view not recorded (3 of 3)]
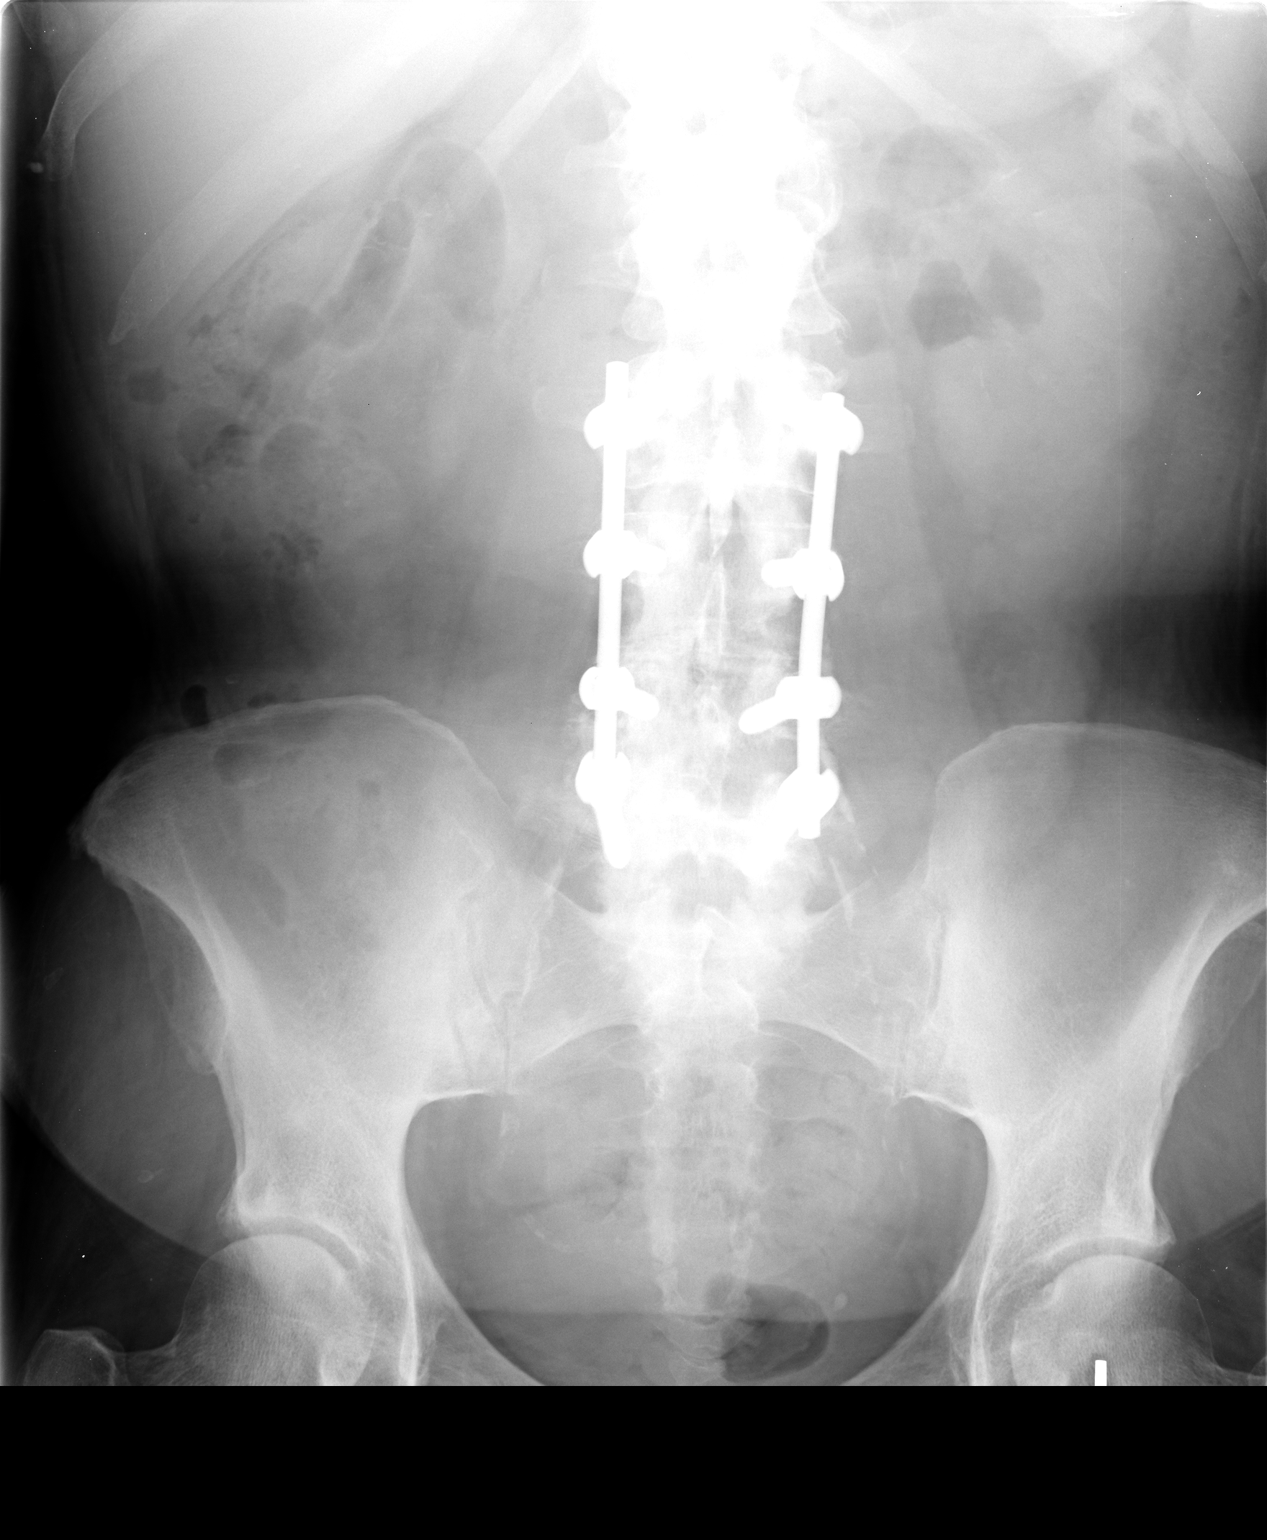

[3 of 3 positions shown; findings below may reference images not displayed]

FINDINGS: Right-sided port in place.  Normal heart.  Lungs are
clear.

No dilated loops of large or small bowel.  There is gas in the
rectum.  Posterior lumbar fusion noted.  No pathologic
calcification.
IMPRESSION: 1.  No acute cardiopulmonary process.
2.  No evidence of bowel obstruction or free air.

## 2011-06-08 LAB — CBC
HCT: 34 — ABNORMAL LOW
Hemoglobin: 12
MCHC: 35.4
RDW: 14.3 — ABNORMAL HIGH

## 2011-06-08 LAB — BASIC METABOLIC PANEL
Chloride: 107
GFR calc Af Amer: >60
GFR calc Af Amer: >60
GFR calc non Af Amer: 53 — ABNORMAL LOW
GFR calc non Af Amer: >60
Glucose, Bld: 330 — ABNORMAL HIGH
Potassium: 3.7
Potassium: 4.1
Sodium: 130 — ABNORMAL LOW
Sodium: 135

## 2011-06-08 LAB — COMPREHENSIVE METABOLIC PANEL
Alkaline Phosphatase: 178 — ABNORMAL HIGH
BUN: 15
CO2: 22
GFR calc non Af Amer: 58 — ABNORMAL LOW
Glucose, Bld: 462 — ABNORMAL HIGH
Potassium: 4
Total Protein: 5.4 — ABNORMAL LOW

## 2011-06-08 LAB — I-STAT 8, (EC8 V) (CONVERTED LAB)
Acid-Base Excess: 3 — ABNORMAL HIGH
Bicarbonate: 26.6 — ABNORMAL HIGH
HCT: 39
Operator id: 208401
pCO2, Ven: 35.7 — ABNORMAL LOW

## 2011-06-08 LAB — POCT I-STAT CREATININE: Creatinine, Ser: 0.8

## 2011-06-08 LAB — DIFFERENTIAL
Basophils Absolute: 0
Basophils Relative: 0
Monocytes Relative: 5
Neutro Abs: 6.8
Neutrophils Relative %: 78 — ABNORMAL HIGH

## 2011-06-14 LAB — BASIC METABOLIC PANEL
CO2: 24
Chloride: 110
GFR calc Af Amer: >60
Potassium: 4
Sodium: 140

## 2011-06-25 ENCOUNTER — Encounter (HOSPITAL_COMMUNITY)
Admission: RE | Admit: 2011-06-25 | Discharge: 2011-06-25 | Disposition: A | Discharge status: Home / Self Care | Payer: Medicare Other | Source: Ambulatory Visit | Attending: Ophthalmology | Admitting: Ophthalmology

## 2011-06-25 LAB — BASIC METABOLIC PANEL
CO2: 24 mEq/L (ref 19–32)
Calcium: 11.4 mg/dL — ABNORMAL HIGH (ref 8.4–10.5)
Chloride: 88 mEq/L — ABNORMAL LOW (ref 96–112)
Creatinine, Ser: 4.87 mg/dL — ABNORMAL HIGH (ref 0.50–1.10)
Glucose, Bld: 241 mg/dL — ABNORMAL HIGH (ref 70–99)

## 2011-06-25 LAB — CBC
Hemoglobin: 12.3 g/dL (ref 12.0–15.0)
MCH: 34.7 pg — ABNORMAL HIGH (ref 26.0–34.0)
MCV: 103.4 fL — ABNORMAL HIGH (ref 78.0–100.0)
RBC: 3.54 MIL/uL — ABNORMAL LOW (ref 3.87–5.11)
WBC: 11.1 10*3/uL — ABNORMAL HIGH (ref 4.0–10.5)

## 2011-06-25 LAB — SURGICAL PCR SCREEN
MRSA, PCR: NEGATIVE
Staphylococcus aureus: NEGATIVE

## 2011-06-26 ENCOUNTER — Encounter (INDEPENDENT_AMBULATORY_CARE_PROVIDER_SITE_OTHER): Payer: Self-pay | Admitting: Internal Medicine

## 2011-06-26 ENCOUNTER — Ambulatory Visit (INDEPENDENT_AMBULATORY_CARE_PROVIDER_SITE_OTHER): Payer: Medicare Other | Admitting: Internal Medicine

## 2011-06-26 DIAGNOSIS — K219 Gastro-esophageal reflux disease without esophagitis: Secondary | ICD-10-CM

## 2011-06-26 DIAGNOSIS — R112 Nausea with vomiting, unspecified: Secondary | ICD-10-CM

## 2011-06-26 DIAGNOSIS — K3184 Gastroparesis: Secondary | ICD-10-CM

## 2011-06-26 MED ORDER — METOCLOPRAMIDE HCL 10 MG PO TABS: ORAL_TABLET | ORAL | Status: DC

## 2011-06-26 MED ORDER — OMEPRAZOLE 40 MG PO CPDR: 40.0000 mg | DELAYED_RELEASE_CAPSULE | Freq: Every day | ORAL | Status: DC

## 2011-06-26 MED ORDER — PROMETHAZINE HCL 12.5 MG PO TABS: 25.0000 mg | ORAL_TABLET | Freq: Three times a day (TID) | ORAL | Status: DC

## 2011-06-26 NOTE — Patient Instructions (Addendum)
Anti-reflux measures must. Please note omeprazole dose changed to 40 mg 30 minutes before breakfast daily in order to prevent interaction with Plavix. Call if  you  have side effects with Reglan as we discussed

## 2011-06-27 ENCOUNTER — Ambulatory Visit (HOSPITAL_COMMUNITY)
Admission: RE | Admit: 2011-06-27 | Discharge: 2011-06-27 | Disposition: A | Discharge status: Home / Self Care | Payer: Medicare Other | Source: Ambulatory Visit | Attending: Ophthalmology | Admitting: Ophthalmology

## 2011-06-27 DIAGNOSIS — J4489 Other specified chronic obstructive pulmonary disease: Secondary | ICD-10-CM | POA: Insufficient documentation

## 2011-06-27 DIAGNOSIS — J449 Chronic obstructive pulmonary disease, unspecified: Secondary | ICD-10-CM | POA: Insufficient documentation

## 2011-06-27 DIAGNOSIS — H4310 Vitreous hemorrhage, unspecified eye: Secondary | ICD-10-CM | POA: Insufficient documentation

## 2011-06-27 DIAGNOSIS — E1139 Type 2 diabetes mellitus with other diabetic ophthalmic complication: Secondary | ICD-10-CM | POA: Insufficient documentation

## 2011-06-27 DIAGNOSIS — H4089 Other specified glaucoma: Secondary | ICD-10-CM | POA: Insufficient documentation

## 2011-06-27 DIAGNOSIS — Z01812 Encounter for preprocedural laboratory examination: Secondary | ICD-10-CM | POA: Insufficient documentation

## 2011-06-27 DIAGNOSIS — I10 Essential (primary) hypertension: Secondary | ICD-10-CM | POA: Insufficient documentation

## 2011-06-27 DIAGNOSIS — H409 Unspecified glaucoma: Secondary | ICD-10-CM | POA: Insufficient documentation

## 2011-06-27 DIAGNOSIS — H211X9 Other vascular disorders of iris and ciliary body, unspecified eye: Secondary | ICD-10-CM | POA: Insufficient documentation

## 2011-06-27 LAB — GLUCOSE, CAPILLARY: Glucose-Capillary: 136 mg/dL — ABNORMAL HIGH (ref 70–99)

## 2011-06-27 NOTE — Progress Notes (Signed)
Presenting complaint; followup for GERD gastroparesis nausea and vomiting. Subjective; patient is 40 year old Caucasian female who is here for scheduled visit accompanied by her sister. She was last seen in June 2012. She was in emergency room one since then. She has not been hospitalized for nausea vomiting as long as she takes her medications. She is experiencing no side effects with metoclopramide or promethazine. She has frequent heartburn but admits to dietary indiscretion. Her sister states that she eats whatever she wants to. Her diabetes has been poorly controlled. She says her blood glucose level runs between 250 and 400. She does not have any symptoms with elevated blood glucose levels however was slow she gets really weak and sweaty. She is having eye surgery tomorrow. He denies melena rectal bleeding or abdominal pain. Current medications Current Outpatient Prescriptions  Medication Sig Dispense Refill  . albuterol (PROVENTIL HFA;VENTOLIN HFA) 108 (90 BASE) MCG/ACT inhaler Inhale 2 puffs into the lungs every 6 (six) hours as needed. Shortness of breath        . ALPRAZolam (XANAX) 1 MG tablet Take 1 mg by mouth 4 (four) times daily as needed. For anxiety       . amLODipine (NORVASC) 10 MG tablet Take 10 mg by mouth daily.        Marland Kitchen b complex-vitamin c-folic acid (NEPHRO-VITE) 0.8 MG TABS Take 0.8 mg by mouth at bedtime.        . butalbital-acetaminophen-caffeine (FIORICET, ESGIC) 50-325-40 MG per tablet Take 1 tablet by mouth every 6 (six) hours as needed. For pain      . cloNIDine (CATAPRES) 0.2 MG tablet Take 0.2 mg by mouth 2 (two) times daily.        . clopidogrel (PLAVIX) 75 MG tablet Take 75 mg by mouth daily.        . insulin glargine (LANTUS) 100 UNIT/ML injection Inject 30 Units into the skin at bedtime.       . insulin lispro (HUMALOG) 100 UNIT/ML injection Inject 7-15 Units into the skin 4 (four) times daily -  before meals and at bedtime. TAKING ON SLIDING SCALE 200-249=7  units 250-299=10 units 300-349=12 units >350=15 units      . lisinopril (PRINIVIL,ZESTRIL) 40 MG tablet Take 40 mg by mouth daily.        . metoCLOPramide (REGLAN) 10 MG tablet Take 10 mg by mouth. Two 10 mg tablets four times daily      . omeprazole (PRILOSEC) 40 MG capsule Take 1 capsule (40 mg total) by mouth daily before breakfast. Take (1) Capsule Twice Daily 30 Minutes Before Breakfast and 30 Minutes Before Supper  30 capsule  5  . oxyCODONE-acetaminophen (PERCOCET) 10-325 MG per tablet Take 1 tablet by mouth every 4 (four) hours as needed. For pain       . PARoxetine (PAXIL) 40 MG tablet Take 40 mg by mouth every morning.       . promethazine (PHENERGAN) 25 MG tablet Take 25 mg by mouth every 6 (six) hours as needed. nausea      . sevelamer (RENVELA) 800 MG tablet Take 4,000 mg by mouth 3 (three) times daily with meals.       . sevelamer (RENVELA) 800 MG tablet Take 1,600 mg by mouth 2 (two) times daily between meals as needed. With snacks       . sodium bicarbonate 650 MG tablet Take 650 mg by mouth 2 (two) times daily.        . sucralfate (CARAFATE) 1 G tablet  Take 1 g by mouth 2 (two) times daily before a meal.       . torsemide (DEMADEX) 100 MG tablet Take 100 mg by mouth daily.        . traZODone (DESYREL) 100 MG tablet Take 200 mg by mouth at bedtime.       . valsartan-hydrochlorothiazide (DIOVAN-HCT) 320-12.5 MG per tablet Take 1 tablet by mouth daily.        . metoCLOPramide (REGLAN) 10 MG tablet 20 mg four times a day by mouth.  240 tablet  2  . promethazine (PHENERGAN) 12.5 MG tablet Take 2 tablets (25 mg total) by mouth 3 (three) times daily before meals.  90 tablet  2   Objective BP 98/72  Pulse 68  Temp(Src) 97.7 F (36.5 C) (Oral)  Resp 14  Ht 5\' 10"  (1.778 m)  Wt 184 lb (83.462 kg)  BMI 26.40 kg/m2 She appears to be comfortable in her wheelchair. She has dark glasses on . Oropharyngeal mucosa is normal No abnormal motor activity good tongue or lips No neck masses  or thyromegaly noted . Lungs are clear to auscultation Abdomen was examined in sitting position it is soft and nontender. she has 2+ LE edema bilaterally. She has dressing over ulcer involving the outer aspect of right leg secondary to trauma that she had 4 months ago(She is getting visit from the home healthcare nurse for dressing). No tremors noted to upper extremities or hands Assessment. #1. Chronic nausea and vomiting secondary to diabetic gastroparesis made worse with  narcotic therapy. She is on high dose metoclopramide having no side effects. Unfortunately there are no alternatives. She has been admitted on numerous occasions to local hospitals as well as tertiary centers. #2. Patient is on double dose PPI and Plavix. PPI schedule needs to be changed to minimize interaction. Plan New prescription given for metoclopramide 20 mg 4 times a day one month with 2 refills. New prescription given for promethazine 25 mg 3 times a day one month with 2 refills. Prescription also given for omeprazole 40 mg every morning. One month with 5 refills  Should advised to take her Plavix in the evening. If omeprazole once a day is not sufficient we will consider switching her to Dexilant. I discussed potential side effects of metoclopramide the patient and her sister and they're both aware and will call if there are any problems. Patient will need to call us in 3 months for metoclopramide and promethazine can be renewed. He would return for only in 6 month

## 2011-06-28 LAB — GLUCOSE, CAPILLARY: Glucose-Capillary: 183 mg/dL — ABNORMAL HIGH (ref 70–99)

## 2011-06-29 ENCOUNTER — Encounter: Payer: Self-pay | Admitting: Cardiology

## 2011-06-29 ENCOUNTER — Ambulatory Visit (INDEPENDENT_AMBULATORY_CARE_PROVIDER_SITE_OTHER): Payer: Medicare Other | Admitting: Cardiology

## 2011-06-29 VITALS — BP 138/72 | HR 93 | Ht 70.5 in | Wt 187.0 lb

## 2011-06-29 DIAGNOSIS — R0989 Other specified symptoms and signs involving the circulatory and respiratory systems: Secondary | ICD-10-CM

## 2011-06-29 DIAGNOSIS — N186 End stage renal disease: Secondary | ICD-10-CM

## 2011-06-29 DIAGNOSIS — I739 Peripheral vascular disease, unspecified: Secondary | ICD-10-CM

## 2011-06-29 DIAGNOSIS — I10 Essential (primary) hypertension: Secondary | ICD-10-CM

## 2011-06-29 DIAGNOSIS — K3184 Gastroparesis: Secondary | ICD-10-CM | POA: Insufficient documentation

## 2011-06-29 DIAGNOSIS — I82409 Acute embolism and thrombosis of unspecified deep veins of unspecified lower extremity: Secondary | ICD-10-CM

## 2011-06-29 DIAGNOSIS — I2510 Atherosclerotic heart disease of native coronary artery without angina pectoris: Secondary | ICD-10-CM

## 2011-06-29 DIAGNOSIS — E1143 Type 2 diabetes mellitus with diabetic autonomic (poly)neuropathy: Secondary | ICD-10-CM

## 2011-06-29 LAB — POCT I-STAT 4, (NA,K, GLUC, HGB,HCT)
Glucose, Bld: 246 mg/dL — ABNORMAL HIGH (ref 70–99)
Potassium: 4.7 mEq/L (ref 3.5–5.1)
Sodium: 130 mEq/L — ABNORMAL LOW (ref 135–145)

## 2011-06-29 NOTE — Patient Instructions (Signed)
   ABI's  Carotid dopplers  Above testing can be done in 2 weeks in office on a Wednesday If the results of your test are normal or stable, you will receive a letter.  If they are abnormal, the nurse will contact you by phone. Your physician wants you to follow up in: 6 months.  You will receive a reminder letter in the mail one-two months in advance.  If you don't receive a letter, please call our office to schedule the follow up appointment

## 2011-07-01 DIAGNOSIS — R0989 Other specified symptoms and signs involving the circulatory and respiratory systems: Secondary | ICD-10-CM | POA: Insufficient documentation

## 2011-07-01 DIAGNOSIS — I739 Peripheral vascular disease, unspecified: Secondary | ICD-10-CM | POA: Insufficient documentation

## 2011-07-01 NOTE — Assessment & Plan Note (Signed)
Will obtain ABIs. The patient also has a wound on the right lower extremity although this is healing. She is diabetic and is followed by the wound Center

## 2011-07-01 NOTE — Assessment & Plan Note (Signed)
No cardiovascular problems on hemodialysis patient is dialyzed through a left arm AV graft.

## 2011-07-01 NOTE — Assessment & Plan Note (Signed)
No recurrent chest pain. Stable on hemodialysis.

## 2011-07-01 NOTE — Assessment & Plan Note (Signed)
We'll obtain carotid Dopplers.

## 2011-07-01 NOTE — Assessment & Plan Note (Signed)
Blood pressure control. Continue current medical therapy 

## 2011-07-01 NOTE — Progress Notes (Signed)
CC: Followup patient with end-stage renal disease and coronary artery disease   HPI:  The patient is a 40 year old female with a history of diabetes mellitus, IBS, coronary artery disease status post prior stent and mild LV dysfunction. The patient has developed end-stage renal disease and is now dialyzed through a left upper arm AV graft. From a cardiovascular standpoint she is doing well. She reports no chest pain or shortness of breath. She has no orthopnea or PND. She had a normal stress echocardiogram approximately year ago. She has mild LV dysfunction but no heart failure symptoms. Previously she was on Coumadin for treatment of DVT/PE this has been discontinued. The patient does report that she had poor healing wound in the right lower extremity although this is improving and is followed by the wound Center. It does not appear that any recent ABIs.   PMH: reviewed and listed in Problem List in Electronic Records (and see below)  Allergies/SH/FHX : available in Electronic Records for review  Medications: Current Outpatient Prescriptions  Medication Sig Dispense Refill  . albuterol (PROVENTIL HFA;VENTOLIN HFA) 108 (90 BASE) MCG/ACT inhaler Inhale 2 puffs into the lungs every 6 (six) hours as needed. Shortness of breath        . ALPRAZolam (XANAX) 1 MG tablet Take 1 mg by mouth 4 (four) times daily as needed. For anxiety       . amLODipine (NORVASC) 10 MG tablet Take 10 mg by mouth daily.        Marland Kitchen b complex-vitamin c-folic acid (NEPHRO-VITE) 0.8 MG TABS Take 0.8 mg by mouth at bedtime.        . butalbital-acetaminophen-caffeine (FIORICET, ESGIC) 50-325-40 MG per tablet Take 1 tablet by mouth every 6 (six) hours as needed. For pain      . cloNIDine (CATAPRES) 0.2 MG tablet Take 0.2 mg by mouth 2 (two) times daily.        . clopidogrel (PLAVIX) 75 MG tablet Take 75 mg by mouth daily.        . insulin glargine (LANTUS) 100 UNIT/ML injection Inject 30 Units into the skin at bedtime.         . insulin lispro (HUMALOG) 100 UNIT/ML injection Inject 7-15 Units into the skin 4 (four) times daily -  before meals and at bedtime. TAKING ON SLIDING SCALE 200-249=7 units 250-299=10 units 300-349=12 units >350=15 units      . lisinopril (PRINIVIL,ZESTRIL) 40 MG tablet Take 40 mg by mouth daily.        . metoCLOPramide (REGLAN) 10 MG tablet Take 10 mg by mouth. Two 10 mg tablets four times daily      . metoCLOPramide (REGLAN) 10 MG tablet 20 mg four times a day by mouth.  240 tablet  2  . omeprazole (PRILOSEC) 40 MG capsule Take 1 capsule (40 mg total) by mouth daily before breakfast. Take (1) Capsule Twice Daily 30 Minutes Before Breakfast and 30 Minutes Before Supper  30 capsule  5  . oxyCODONE-acetaminophen (PERCOCET) 10-325 MG per tablet Take 1 tablet by mouth every 4 (four) hours as needed. For pain       . PARoxetine (PAXIL) 40 MG tablet Take 40 mg by mouth every morning.       . promethazine (PHENERGAN) 12.5 MG tablet Take 2 tablets (25 mg total) by mouth 3 (three) times daily before meals.  90 tablet  2  . promethazine (PHENERGAN) 25 MG tablet Take 25 mg by mouth every 6 (six) hours as needed. nausea      .  sevelamer (RENVELA) 800 MG tablet Take 4,000 mg by mouth 3 (three) times daily with meals.       . sevelamer (RENVELA) 800 MG tablet Take 1,600 mg by mouth 2 (two) times daily between meals as needed. With snacks       . sodium bicarbonate 650 MG tablet Take 650 mg by mouth 2 (two) times daily.        . sucralfate (CARAFATE) 1 G tablet Take 1 g by mouth 2 (two) times daily before a meal.       . torsemide (DEMADEX) 100 MG tablet Take 100 mg by mouth daily.        . traZODone (DESYREL) 100 MG tablet Take 200 mg by mouth at bedtime.       . valsartan-hydrochlorothiazide (DIOVAN-HCT) 320-12.5 MG per tablet Take 1 tablet by mouth daily.          ROS: No nausea or vomiting. No fever or chills.No melena or hematochezia.No bleeding.symptoms of claudication  Physical Exam: BP 138/72   Pulse 93  Ht 5' 10.5" (1.791 m)  Wt 187 lb (84.823 kg)  BMI 26.45 kg/m2 General: Pale appearing white female but in no apparent distress Neck: Normal carotid upstroke with questionable left carotid bruit although this could be radiating from the left upper arm fistula. Nonnodular thyroid. No thyromegaly JVD is approximately 5 cm Lungs: Clear breath sounds bilaterally without any wheezing Cardiac: Regular rate and rhythm with normal S1-S2. No definite pathological murmurs Vascular: Left upper arm AV graft with normal bruit. Unable to palpate dorsalis pedis and surgical pulses Skin: Wound of the  right lower extremity dressing in place.  12lead ECG: Not available Limited bedside ECHO:N/A   Assessment and Plan

## 2011-07-01 NOTE — Assessment & Plan Note (Signed)
Coumadin has been discontinued. Patient currently taking Plavix for history of coronary artery disease

## 2011-07-11 ENCOUNTER — Encounter: Payer: Medicare Other | Admitting: *Deleted

## 2011-07-25 ENCOUNTER — Encounter: Payer: Medicare Other | Admitting: *Deleted

## 2011-08-06 ENCOUNTER — Encounter (HOSPITAL_COMMUNITY): Payer: Medicare Other

## 2011-08-20 ENCOUNTER — Emergency Department (HOSPITAL_COMMUNITY): Payer: Medicare Other

## 2011-08-20 ENCOUNTER — Inpatient Hospital Stay (HOSPITAL_COMMUNITY): Payer: Medicare Other

## 2011-08-20 ENCOUNTER — Encounter (HOSPITAL_COMMUNITY): Payer: Self-pay | Admitting: *Deleted

## 2011-08-20 ENCOUNTER — Inpatient Hospital Stay (HOSPITAL_COMMUNITY)
Admission: EM | Admit: 2011-08-20 | Discharge: 2011-08-23 | DRG: 073 | Disposition: A | Discharge status: Home / Self Care | Payer: Medicare Other | Attending: Internal Medicine | Admitting: Internal Medicine

## 2011-08-20 ENCOUNTER — Other Ambulatory Visit: Payer: Self-pay

## 2011-08-20 DIAGNOSIS — I739 Peripheral vascular disease, unspecified: Secondary | ICD-10-CM

## 2011-08-20 DIAGNOSIS — I10 Essential (primary) hypertension: Secondary | ICD-10-CM

## 2011-08-20 DIAGNOSIS — K3184 Gastroparesis: Secondary | ICD-10-CM | POA: Diagnosis present

## 2011-08-20 DIAGNOSIS — L0390 Cellulitis, unspecified: Secondary | ICD-10-CM

## 2011-08-20 DIAGNOSIS — I2510 Atherosclerotic heart disease of native coronary artery without angina pectoris: Secondary | ICD-10-CM

## 2011-08-20 DIAGNOSIS — F329 Major depressive disorder, single episode, unspecified: Secondary | ICD-10-CM | POA: Diagnosis present

## 2011-08-20 DIAGNOSIS — E1143 Type 2 diabetes mellitus with diabetic autonomic (poly)neuropathy: Secondary | ICD-10-CM

## 2011-08-20 DIAGNOSIS — I120 Hypertensive chronic kidney disease with stage 5 chronic kidney disease or end stage renal disease: Secondary | ICD-10-CM | POA: Diagnosis present

## 2011-08-20 DIAGNOSIS — N186 End stage renal disease: Secondary | ICD-10-CM

## 2011-08-20 DIAGNOSIS — F3289 Other specified depressive episodes: Secondary | ICD-10-CM | POA: Diagnosis present

## 2011-08-20 DIAGNOSIS — Z992 Dependence on renal dialysis: Secondary | ICD-10-CM

## 2011-08-20 DIAGNOSIS — N049 Nephrotic syndrome with unspecified morphologic changes: Secondary | ICD-10-CM

## 2011-08-20 DIAGNOSIS — R072 Precordial pain: Secondary | ICD-10-CM

## 2011-08-20 DIAGNOSIS — N19 Unspecified kidney failure: Secondary | ICD-10-CM

## 2011-08-20 DIAGNOSIS — Z9119 Patient's noncompliance with other medical treatment and regimen: Secondary | ICD-10-CM

## 2011-08-20 DIAGNOSIS — Z86718 Personal history of other venous thrombosis and embolism: Secondary | ICD-10-CM

## 2011-08-20 DIAGNOSIS — E1149 Type 2 diabetes mellitus with other diabetic neurological complication: Principal | ICD-10-CM | POA: Diagnosis present

## 2011-08-20 DIAGNOSIS — S70259A Superficial foreign body, unspecified hip, initial encounter: Secondary | ICD-10-CM

## 2011-08-20 DIAGNOSIS — R0602 Shortness of breath: Secondary | ICD-10-CM

## 2011-08-20 DIAGNOSIS — N2581 Secondary hyperparathyroidism of renal origin: Secondary | ICD-10-CM | POA: Diagnosis present

## 2011-08-20 DIAGNOSIS — E871 Hypo-osmolality and hyponatremia: Secondary | ICD-10-CM | POA: Diagnosis present

## 2011-08-20 DIAGNOSIS — L02419 Cutaneous abscess of limb, unspecified: Secondary | ICD-10-CM | POA: Diagnosis present

## 2011-08-20 DIAGNOSIS — Z91199 Patient's noncompliance with other medical treatment and regimen due to unspecified reason: Secondary | ICD-10-CM

## 2011-08-20 DIAGNOSIS — I82409 Acute embolism and thrombosis of unspecified deep veins of unspecified lower extremity: Secondary | ICD-10-CM

## 2011-08-20 DIAGNOSIS — R0989 Other specified symptoms and signs involving the circulatory and respiratory systems: Secondary | ICD-10-CM

## 2011-08-20 DIAGNOSIS — J189 Pneumonia, unspecified organism: Secondary | ICD-10-CM

## 2011-08-20 DIAGNOSIS — R609 Edema, unspecified: Secondary | ICD-10-CM

## 2011-08-20 DIAGNOSIS — F411 Generalized anxiety disorder: Secondary | ICD-10-CM | POA: Diagnosis present

## 2011-08-20 DIAGNOSIS — E119 Type 2 diabetes mellitus without complications: Secondary | ICD-10-CM

## 2011-08-20 LAB — HEPATIC FUNCTION PANEL
ALT: 11 U/L (ref 0–35)
AST: 8 U/L (ref 0–37)
Total Protein: 6.9 g/dL (ref 6.0–8.3)

## 2011-08-20 LAB — BASIC METABOLIC PANEL
CO2: 21 mEq/L (ref 19–32)
Calcium: 10.2 mg/dL (ref 8.4–10.5)
GFR calc non Af Amer: 6 mL/min — ABNORMAL LOW (ref >90)
Potassium: 4 mEq/L (ref 3.5–5.1)
Sodium: 131 mEq/L — ABNORMAL LOW (ref 135–145)

## 2011-08-20 LAB — CBC
Platelets: 361 10*3/uL (ref 150–400)
RBC: 2.94 MIL/uL — ABNORMAL LOW (ref 3.87–5.11)
WBC: 8.6 10*3/uL (ref 4.0–10.5)

## 2011-08-20 LAB — PRO B NATRIURETIC PEPTIDE: Pro B Natriuretic peptide (BNP): 4201 pg/mL — ABNORMAL HIGH (ref 0–125)

## 2011-08-20 LAB — GLUCOSE, CAPILLARY: Glucose-Capillary: 126 mg/dL — ABNORMAL HIGH (ref 70–99)

## 2011-08-20 MED ORDER — BRINZOLAMIDE 1 % OP SUSP
1.0000 [drp] | Freq: Three times a day (TID) | OPHTHALMIC | Status: DC
  Administered 2011-08-20 – 2011-08-23 (×9): 1 [drp] via OPHTHALMIC
  Filled 2011-08-20: qty 10

## 2011-08-20 MED ORDER — RENA-VITE PO TABS
1.0000 | ORAL_TABLET | Freq: Every day | ORAL | Status: DC
  Administered 2011-08-20 – 2011-08-23 (×4): 1 via ORAL
  Filled 2011-08-20 (×6): qty 1

## 2011-08-20 MED ORDER — LIDOCAINE HCL (PF) 1 % IJ SOLN: 5.0000 mL | INTRAMUSCULAR | Status: DC | PRN

## 2011-08-20 MED ORDER — CINACALCET HCL 30 MG PO TABS
30.0000 mg | ORAL_TABLET | Freq: Every day | ORAL | Status: DC
  Administered 2011-08-20 – 2011-08-23 (×3): 30 mg via ORAL
  Filled 2011-08-20 (×5): qty 1

## 2011-08-20 MED ORDER — SODIUM CHLORIDE 0.9 % IV SOLN: 100.0000 mL | INTRAVENOUS | Status: DC | PRN

## 2011-08-20 MED ORDER — VANCOMYCIN HCL IN DEXTROSE 1-5 GM/200ML-% IV SOLN
1000.0000 mg | INTRAVENOUS | Status: DC
  Administered 2011-08-22: 1000 mg via INTRAVENOUS
  Filled 2011-08-20 (×3): qty 200

## 2011-08-20 MED ORDER — HYDROMORPHONE HCL PF 1 MG/ML IJ SOLN
0.5000 mg | Freq: Three times a day (TID) | INTRAMUSCULAR | Status: DC | PRN
  Administered 2011-08-20 – 2011-08-22 (×3): 0.5 mg via INTRAVENOUS
  Filled 2011-08-20 (×3): qty 1

## 2011-08-20 MED ORDER — NEPRO/CARBSTEADY PO LIQD: 237.0000 mL | ORAL | Status: DC | PRN

## 2011-08-20 MED ORDER — DEXTROSE 5 % IV SOLN
500.0000 mg | INTRAVENOUS | Status: DC
  Filled 2011-08-20 (×2): qty 500

## 2011-08-20 MED ORDER — DEXTROSE 5 % IV SOLN
1.0000 g | INTRAVENOUS | Status: DC
  Filled 2011-08-20 (×2): qty 10

## 2011-08-20 MED ORDER — SODIUM BICARBONATE 650 MG PO TABS
650.0000 mg | ORAL_TABLET | Freq: Two times a day (BID) | ORAL | Status: DC
  Administered 2011-08-20 – 2011-08-23 (×6): 650 mg via ORAL
  Filled 2011-08-20 (×7): qty 1

## 2011-08-20 MED ORDER — TORSEMIDE 20 MG PO TABS
100.0000 mg | ORAL_TABLET | Freq: Every day | ORAL | Status: DC
  Administered 2011-08-20 – 2011-08-23 (×3): 100 mg via ORAL
  Filled 2011-08-20 (×4): qty 5

## 2011-08-20 MED ORDER — PENTAFLUOROPROP-TETRAFLUOROETH EX AERO
1.0000 "application " | INHALATION_SPRAY | CUTANEOUS | Status: DC | PRN
  Filled 2011-08-20: qty 103.5

## 2011-08-20 MED ORDER — ARIPIPRAZOLE 2 MG PO TABS
2.0000 mg | ORAL_TABLET | Freq: Every day | ORAL | Status: DC
  Administered 2011-08-21 – 2011-08-23 (×2): 2 mg via ORAL
  Filled 2011-08-20 (×5): qty 1

## 2011-08-20 MED ORDER — SEVELAMER CARBONATE 800 MG PO TABS
1600.0000 mg | ORAL_TABLET | Freq: Three times a day (TID) | ORAL | Status: DC
  Administered 2011-08-21 – 2011-08-23 (×4): 1600 mg via ORAL
  Filled 2011-08-20 (×4): qty 2

## 2011-08-20 MED ORDER — HEPARIN SODIUM (PORCINE) 1000 UNIT/ML DIALYSIS
1000.0000 [IU] | INTRAMUSCULAR | Status: DC | PRN
  Filled 2011-08-20: qty 1

## 2011-08-20 MED ORDER — SUCRALFATE 1 G PO TABS
1.0000 g | ORAL_TABLET | Freq: Two times a day (BID) | ORAL | Status: DC
  Administered 2011-08-21 – 2011-08-23 (×3): 1 g via ORAL
  Filled 2011-08-20 (×3): qty 1

## 2011-08-20 MED ORDER — PREDNISOLONE ACETATE 1 % OP SUSP
1.0000 [drp] | Freq: Two times a day (BID) | OPHTHALMIC | Status: DC
  Administered 2011-08-20 – 2011-08-23 (×7): 1 [drp] via OPHTHALMIC
  Filled 2011-08-20: qty 1

## 2011-08-20 MED ORDER — DEXTROSE 50 % IV SOLN
INTRAVENOUS | Status: AC
  Filled 2011-08-20: qty 50

## 2011-08-20 MED ORDER — VANCOMYCIN HCL IN DEXTROSE 1-5 GM/200ML-% IV SOLN
1000.0000 mg | Freq: Once | INTRAVENOUS | Status: AC
  Administered 2011-08-20: 1000 mg via INTRAVENOUS
  Filled 2011-08-20: qty 200

## 2011-08-20 MED ORDER — PAROXETINE HCL 20 MG PO TABS
40.0000 mg | ORAL_TABLET | ORAL | Status: DC
  Administered 2011-08-21 – 2011-08-23 (×4): 40 mg via ORAL
  Filled 2011-08-20 (×4): qty 2

## 2011-08-20 MED ORDER — HOMATROPINE HBR 5 % OP SOLN
1.0000 [drp] | Freq: Three times a day (TID) | OPHTHALMIC | Status: DC
  Administered 2011-08-21 – 2011-08-22 (×2): 1 [drp] via OPHTHALMIC
  Filled 2011-08-20: qty 5

## 2011-08-20 MED ORDER — TRAVOPROST 0.004 % OP SOLN
1.0000 [drp] | Freq: Every day | OPHTHALMIC | Status: DC
  Administered 2011-08-21 – 2011-08-22 (×3): 1 [drp] via OPHTHALMIC
  Filled 2011-08-20 (×4): qty 0.1

## 2011-08-20 MED ORDER — EPOETIN ALFA 10000 UNIT/ML IJ SOLN
4000.0000 [IU] | INTRAMUSCULAR | Status: DC
  Administered 2011-08-20 – 2011-08-22 (×2): 4000 [IU] via INTRAVENOUS
  Filled 2011-08-20 (×5): qty 1

## 2011-08-20 MED ORDER — SODIUM CHLORIDE 0.9 % IV SOLN
250.0000 mg | Freq: Two times a day (BID) | INTRAVENOUS | Status: DC
  Administered 2011-08-20 – 2011-08-22 (×5): 250 mg via INTRAVENOUS
  Filled 2011-08-20 (×9): qty 250

## 2011-08-20 MED ORDER — HYDROCHLOROTHIAZIDE 25 MG PO TABS
12.5000 mg | ORAL_TABLET | Freq: Every day | ORAL | Status: DC
  Administered 2011-08-20: 25 mg via ORAL
  Administered 2011-08-21: 12.5 mg via ORAL
  Filled 2011-08-20 (×3): qty 1

## 2011-08-20 MED ORDER — LOTEPREDNOL ETABONATE 0.2 % OP SUSP
1.0000 [drp] | Freq: Four times a day (QID) | OPHTHALMIC | Status: DC
  Administered 2011-08-20 – 2011-08-23 (×9): 1 [drp] via OPHTHALMIC
  Filled 2011-08-20 (×15): qty 0.1

## 2011-08-20 MED ORDER — LISINOPRIL 10 MG PO TABS
40.0000 mg | ORAL_TABLET | Freq: Every day | ORAL | Status: DC
  Administered 2011-08-20 – 2011-08-23 (×3): 40 mg via ORAL
  Filled 2011-08-20 (×4): qty 4

## 2011-08-20 MED ORDER — INSULIN ASPART 100 UNIT/ML ~~LOC~~ SOLN
1.0000 [IU] | Freq: Three times a day (TID) | SUBCUTANEOUS | Status: DC
  Administered 2011-08-22: 2 [IU] via SUBCUTANEOUS
  Filled 2011-08-20: qty 3

## 2011-08-20 MED ORDER — CLONIDINE HCL 0.2 MG PO TABS
0.2000 mg | ORAL_TABLET | Freq: Two times a day (BID) | ORAL | Status: DC
  Administered 2011-08-20 – 2011-08-23 (×6): 0.2 mg via ORAL
  Filled 2011-08-20 (×7): qty 1

## 2011-08-20 MED ORDER — OLMESARTAN MEDOXOMIL 20 MG PO TABS
40.0000 mg | ORAL_TABLET | Freq: Every day | ORAL | Status: DC
  Administered 2011-08-20 – 2011-08-23 (×3): 40 mg via ORAL
  Filled 2011-08-20 (×4): qty 2

## 2011-08-20 MED ORDER — AMLODIPINE BESYLATE 5 MG PO TABS
10.0000 mg | ORAL_TABLET | Freq: Every day | ORAL | Status: DC
  Administered 2011-08-20 – 2011-08-23 (×3): 10 mg via ORAL
  Filled 2011-08-20 (×4): qty 2

## 2011-08-20 MED ORDER — INSULIN GLARGINE 100 UNIT/ML ~~LOC~~ SOLN
20.0000 [IU] | Freq: Every day | SUBCUTANEOUS | Status: DC
  Administered 2011-08-20 – 2011-08-22 (×3): 20 [IU] via SUBCUTANEOUS
  Filled 2011-08-20: qty 3

## 2011-08-20 MED ORDER — TIMOLOL HEMIHYDRATE 0.5 % OP SOLN
1.0000 [drp] | Freq: Two times a day (BID) | OPHTHALMIC | Status: DC
  Administered 2011-08-20 – 2011-08-23 (×7): 1 [drp] via OPHTHALMIC
  Filled 2011-08-20 (×8): qty 0.1

## 2011-08-20 MED ORDER — TRAZODONE HCL 50 MG PO TABS
200.0000 mg | ORAL_TABLET | Freq: Every day | ORAL | Status: DC
  Administered 2011-08-20 – 2011-08-22 (×3): 200 mg via ORAL
  Filled 2011-08-20 (×3): qty 4

## 2011-08-20 MED ORDER — ENOXAPARIN SODIUM 30 MG/0.3ML ~~LOC~~ SOLN
30.0000 mg | Freq: Every day | SUBCUTANEOUS | Status: DC
  Administered 2011-08-20 – 2011-08-22 (×3): 30 mg via SUBCUTANEOUS
  Filled 2011-08-20 (×4): qty 0.3

## 2011-08-20 MED ORDER — DEXTROSE 50 % IV SOLN
25.0000 mL | Freq: Once | INTRAVENOUS | Status: AC
  Administered 2011-08-20: 25 mL via INTRAVENOUS

## 2011-08-20 MED ORDER — BUTALBITAL-APAP-CAFFEINE 50-325-40 MG PO TABS: 1.0000 | ORAL_TABLET | Freq: Four times a day (QID) | ORAL | Status: DC | PRN

## 2011-08-20 MED ORDER — VALSARTAN-HYDROCHLOROTHIAZIDE 320-12.5 MG PO TABS: 1.0000 | ORAL_TABLET | Freq: Every day | ORAL | Status: DC

## 2011-08-20 MED ORDER — CLOPIDOGREL BISULFATE 75 MG PO TABS
75.0000 mg | ORAL_TABLET | Freq: Every day | ORAL | Status: DC
  Administered 2011-08-20 – 2011-08-23 (×3): 75 mg via ORAL
  Filled 2011-08-20 (×4): qty 1

## 2011-08-20 MED ORDER — BRIMONIDINE TARTRATE 0.15 % OP SOLN
1.0000 [drp] | Freq: Two times a day (BID) | OPHTHALMIC | Status: DC
  Administered 2011-08-20 – 2011-08-23 (×7): 1 [drp] via OPHTHALMIC
  Filled 2011-08-20: qty 5

## 2011-08-20 MED ORDER — VANCOMYCIN HCL IN DEXTROSE 1-5 GM/200ML-% IV SOLN
1000.0000 mg | INTRAVENOUS | Status: DC
  Filled 2011-08-20: qty 200

## 2011-08-20 MED ORDER — ALTEPLASE 2 MG IJ SOLR
2.0000 mg | Freq: Once | INTRAMUSCULAR | Status: AC | PRN
  Filled 2011-08-20: qty 2

## 2011-08-20 MED ORDER — ONDANSETRON HCL 4 MG/2ML IJ SOLN
4.0000 mg | Freq: Once | INTRAMUSCULAR | Status: AC
  Administered 2011-08-20: 4 mg via INTRAVENOUS
  Filled 2011-08-20: qty 2

## 2011-08-20 MED ORDER — HEPARIN SODIUM (PORCINE) 1000 UNIT/ML DIALYSIS
100.0000 [IU]/kg | INTRAMUSCULAR | Status: DC | PRN
  Administered 2011-08-20 – 2011-08-22 (×2): 8200 [IU] via INTRAVENOUS_CENTRAL
  Filled 2011-08-20: qty 9

## 2011-08-20 MED ORDER — METOCLOPRAMIDE HCL 5 MG/ML IJ SOLN
10.0000 mg | Freq: Three times a day (TID) | INTRAMUSCULAR | Status: DC
Start: 2011-08-20 — End: 2011-08-22
  Administered 2011-08-20 – 2011-08-22 (×8): 10 mg via INTRAVENOUS
  Filled 2011-08-20 (×7): qty 2

## 2011-08-20 MED ORDER — LIDOCAINE-PRILOCAINE 2.5-2.5 % EX CREA: 1.0000 "application " | TOPICAL_CREAM | CUTANEOUS | Status: DC | PRN

## 2011-08-20 MED ORDER — ATROPINE SULFATE 1 % OP SOLN
1.0000 [drp] | Freq: Four times a day (QID) | OPHTHALMIC | Status: DC
  Administered 2011-08-20 – 2011-08-23 (×9): 1 [drp] via OPHTHALMIC
  Filled 2011-08-20: qty 2

## 2011-08-20 NOTE — ED Notes (Signed)
RN spoke with Lupita Leash, pt's sister, to inform her of pt's admission; [name] states pt has eye drops that have been ordered that need to be administered while pt is in hospital; prescript verified with [facility] in [place] and placed in pt's [facility]

## 2011-08-20 NOTE — Consult Note (Signed)
[name] MRN: [id number] DOB/AGE: [birth year] 40 y.o. Primary Care Physician:BLUTH,KIRK, MD Admit date: 08/20/2011 Chief Complaint:  Chief Complaint  Patient presents with  . Nausea  . Emesis  . Headache  . Sore Throat  . Abdominal Pain  . Chest Pain   HPI:   Pt is 40 YO F with Pmhx of ESRD who presented to ER with C/o Shotness of Breath.  HPI date back to 11 days ago when pt started having body aches. Pt said that she felt like she was coming down with flu. Pt did not go for her dialysis after 08/11/11. Pt also c/o Nausea and emesis. Pt was having this off and  On but NO blood in emesis. Pt c/o Shortness of breath- progressive over past 2 weeks Pt c/o Diarrhea last week no more diarrhea now. Pt c/o headaches NO c/o loss of consciousness  No c/o Change in speech. C/o cough, Pt mother said that her symptoms were getting worse and that's the reason tey ame to Er last night.    Past Medical History  Diagnosis Date  . DVT (deep venous thrombosis)     DVT HISTORY  . Renal failure   . Diabetes mellitus   . Migraine     HISTORY  . Anxiety and depression   . CAD (coronary artery disease)   . Bipolar 1 disorder   . Nephrotic syndrome     IN THE PAST  . Hyperlipidemia   . Hyperparathyroidism   . PE (pulmonary embolism)     HISTORY  . Dialysis patient   . Anemia       Family hx- NO hx of ESRD  History reviewed. No pertinent family history.  Social History:  reports that she has been smoking Cigarettes.  She has a 40 pack-year smoking history. She has never used smokeless tobacco. She reports that she does not drink alcohol or use illicit drugs.   Allergies:  Allergies  Allergen Reactions  . Compazine (Prochlorperazine) Swelling    Per echart records  . Contrast Media (Iodinated Diagnostic Agents)     Per echart records  . Iodine     Reaction unknown  . Meperidine Hcl Swelling  . Metformin And Related   . Morphine Swelling  . Nubain (Nalbuphine Hcl)  Swelling  . Topiramate (Topamax) Swelling    Tongue swelling - per echart records  . Toradol (Ketorolac Tromethamine) Swelling    Medications Prior to Admission  Medication Dose Route Frequency Provider Last Rate Last Dose  . azithromycin (ZITHROMAX) 500 mg in dextrose 5 % 250 mL IVPB  500 mg Intravenous Q24H Shelda Jakes, MD      . cefTRIAXone (ROCEPHIN) 1 g in dextrose 5 % 50 mL IVPB  1 g Intravenous Q24H Shelda Jakes, MD      . dextrose 50 % solution 25 mL  25 mL Intravenous Once Shelda Jakes, MD   25 mL at 08/20/11 1218  . ondansetron (ZOFRAN) injection 4 mg  4 mg Intravenous Once Shelda Jakes, MD   4 mg at 08/20/11 1058  . vancomycin (VANCOCIN) IVPB 1000 mg/200 mL premix  1,000 mg Intravenous Once Shelda Jakes, MD 200 mL/hr at 08/20/11 1211 1,000 mg at 08/20/11 1211   Medications Prior to Admission  Medication Sig Dispense Refill  . albuterol (PROVENTIL HFA;VENTOLIN HFA) 108 (90 BASE) MCG/ACT inhaler Inhale 2 puffs into the lungs every 6 (six) hours as needed. Shortness of breath        .  ALPRAZolam (XANAX) 1 MG tablet Take 1 mg by mouth 4 (four) times daily as needed. For anxiety       . amLODipine (NORVASC) 10 MG tablet Take 10 mg by mouth daily.        Marland Kitchen b complex-vitamin c-folic acid (NEPHRO-VITE) 0.8 MG TABS Take 0.8 mg by mouth at bedtime.        . butalbital-acetaminophen-caffeine (FIORICET, ESGIC) 50-325-40 MG per tablet Take 1 tablet by mouth every 6 (six) hours as needed. For pain      . cloNIDine (CATAPRES) 0.2 MG tablet Take 0.2 mg by mouth 2 (two) times daily.        . clopidogrel (PLAVIX) 75 MG tablet Take 75 mg by mouth daily.        . insulin glargine (LANTUS) 100 UNIT/ML injection Inject 50 Units into the skin at bedtime.       . insulin lispro (HUMALOG) 100 UNIT/ML injection Inject 7-15 Units into the skin 4 (four) times daily -  before meals and at bedtime. TAKING ON SLIDING SCALE 200-249=7 units 250-299=10 units 300-349=12  units >350=15 units      . lisinopril (PRINIVIL,ZESTRIL) 40 MG tablet Take 40 mg by mouth daily.        . metoCLOPramide (REGLAN) 10 MG tablet 20 mg four times a day by mouth.  240 tablet  2  . omeprazole (PRILOSEC) 40 MG capsule Take 1 capsule (40 mg total) by mouth daily before breakfast. Take (1) Capsule Twice Daily 30 Minutes Before Breakfast and 30 Minutes Before Supper  30 capsule  5  . oxyCODONE-acetaminophen (PERCOCET) 10-325 MG per tablet Take 1 tablet by mouth every 4 (four) hours as needed. For pain       . PARoxetine (PAXIL) 40 MG tablet Take 40 mg by mouth every morning.       . promethazine (PHENERGAN) 25 MG tablet Take 25 mg by mouth every 6 (six) hours as needed. nausea      . sevelamer (RENVELA) 800 MG tablet Take 4,000 mg by mouth 3 (three) times daily with meals.       . sodium bicarbonate 650 MG tablet Take 650 mg by mouth 2 (two) times daily.        . sucralfate (CARAFATE) 1 G tablet Take 1 g by mouth 2 (two) times daily before a meal.       . torsemide (DEMADEX) 100 MG tablet Take 100 mg by mouth daily.        . traZODone (DESYREL) 100 MG tablet Take 200 mg by mouth at bedtime.       . valsartan-hydrochlorothiazide (DIOVAN-HCT) 320-12.5 MG per tablet Take 1 tablet by mouth daily.        . metoCLOPramide (REGLAN) 10 MG tablet Take 10 mg by mouth. Two 10 mg tablets four times daily           JXB:JYNWG from the symptoms mentioned above,there are no other symptoms referable to all systems reviewed.     Marland Kitchen azithromycin  500 mg Intravenous Q24H  . cefTRIAXone (ROCEPHIN)  IV  1 g Intravenous Q24H  . dextrose  25 mL Intravenous Once  . ondansetron  4 mg Intravenous Once  . vancomycin  1,000 mg Intravenous Once         NFA:OZHYQ from the symptoms mentioned above,there are no other symptoms referable to all systems reviewed.  Physical Exam: Vital signs in last 24 hours: Temp:  [97.5 F (36.4 C)] 97.5 F (36.4 C) (12/24 0421)  Pulse Rate:  [78-90] 85  (12/24  1312) Resp:  [16-20] 18  (12/24 1312) BP: (145-159)/(73-79) 159/79 mmHg (12/24 1312) SpO2:  [90 %-100 %] 96 % (12/24 1312) Weight:  [180 lb (81.647 kg)] 180 lb (81.647 kg) (12/24 1312) Weight change:     :     Physical Exam: General- pt is awake,alert, oriented to time place and person Resp- No acute REsp distress, Crackles + CVS- S1S2 regular in rate and rhythm GIT- BS+, soft, NT, ND EXT-2+ LE Edema, Right Leg chronic wounded 3 cm CNS- CN 2-12 grossly intact. Moving all 4 extremities Psych- normal mod and affect Access- AVG- Bruit +\THrill+F   Lab Results: CBC  Basename 08/20/11 1010  WBC 8.6  HGB 10.2*  HCT 30.1*  PLT 361    BMET  Basename 08/20/11 1010  NA 131*  K 4.0  CL 91*  CO2 21  GLUCOSE 55*  BUN 112*  CREATININE 7.52*  CALCIUM 10.2    MICRO Recent Results (from the past 240 hour(s))  CULTURE, BLOOD (ROUTINE X 2)     Status: Normal (Preliminary result)   Collection Time   08/20/11 10:10 AM      Component Value Range Status Comment   Specimen Description Blood   Final    Special Requests NONE   Final    Culture NO GROWTH <24 HRS   Final    Report Status PENDING   Incomplete       Lab Results  Component Value Date   PTH 180.7* 10/19/2008   CALCIUM 10.2 08/20/2011   PHOS 5.6* 03/17/2010   CXR IMPRESSION:  Right lower lobe consolidation concerning for pneumonia. Suspect  small right effusion.   AXR IMPRESSION:  Nonspecific, nonobstructive bowel gas pattern.     Impression: 1)Renal  ESRD on HD                NON complaint with tx regimen                Missed past 3 -4 tx               Pt had Left AVG for access.  2)HTN Target Organ damage  CKD CAD  Medication as oupt-  On Calcium Channel Blockers On Central Acting Sympatholytics- Clonidine   3)Anemia HGb at goal (9--11) GI work up Colonoscopy  4)CKD Mineral-Bone Disorder PTH acceptabled Secondary Hyperparathyroidism present  Phosphorus not at goal.  5)ID  Pneumonia  6)FEN  Normokalemic Hyponatremic secondary to ESRD Hypervolemic   7)Acid base Co2 at goal though on Lower side     Plan:  1)Will dialyze today 2)Will try to take 3-4 liters off 3)Agree with current ABX regimen 4)Will use epo during HD 5) will ask for BMD labs in am 6) Educated at length about need to follow medical regimen      BHUTANI,MANPREET S 08/20/2011, 2:13 PM

## 2011-08-20 NOTE — Progress Notes (Signed)
ANTIBIOTIC CONSULT NOTE - INITIAL  Pharmacy Consult for Vancomycin/ Imipenem Indication: pneumonia  Allergies  Allergen Reactions  . Compazine (Prochlorperazine) Swelling    Per echart records  . Contrast Media (Iodinated Diagnostic Agents)     Per echart records  . Iodine     Reaction unknown  . Meperidine Hcl Swelling  . Metformin And Related   . Morphine Swelling  . Nubain (Nalbuphine Hcl) Swelling  . Topiramate (Topamax) Swelling    Tongue swelling - per echart records  . Toradol (Ketorolac Tromethamine) Swelling    Patient Measurements: Height: 5' 10.5" (179.1 cm) (Per px) Weight: 180 lb (81.647 kg) (per px) IBW/kg (Calculated) : 69.65  Adjusted Body Weight:    Vital Signs: Temp: 97.5 F (36.4 C) (12/24 0421) Temp src: Oral (12/24 0421) BP: 159/79 mmHg (12/24 1312) Pulse Rate: 85  (12/24 1312) Intake/Output from previous day:   Intake/Output from this shift:    Labs:  Basename 08/20/11 1010  WBC 8.6  HGB 10.2*  PLT 361  LABCREA --  CREATININE 7.52*   Estimated Creatinine Clearance: 10.8 ml/min (by C-G formula based on Cr of 7.52).    Microbiology: Recent Results (from the past 720 hour(s))  CULTURE, BLOOD (ROUTINE X 2)     Status: Normal (Preliminary result)   Collection Time   08/20/11 10:10 AM      Component Value Range Status Comment   Specimen Description Blood   Final    Special Requests NONE   Final    Culture NO GROWTH <24 HRS   Final    Report Status PENDING   Incomplete     Medical History: Past Medical History  Diagnosis Date  . DVT (deep venous thrombosis)     DVT HISTORY  . Renal failure   . Diabetes mellitus   . Migraine     HISTORY  . Anxiety and depression   . CAD (coronary artery disease)   . Bipolar 1 disorder   . Nephrotic syndrome     IN THE PAST  . Hyperlipidemia   . Hyperparathyroidism   . PE (pulmonary embolism)     HISTORY  . Dialysis patient   . Anemia     Medications:  Scheduled:    . amLODipine  10  mg Oral Daily  . ARIPiprazole  2 mg Oral Daily  . atropine  1 drop Right Eye QID  . brimonidine  1 drop Right Eye BID  . brinzolamide  1 drop Right Eye TID  . cinacalcet  30 mg Oral Daily  . cloNIDine  0.2 mg Oral BID  . clopidogrel  75 mg Oral Daily  . dextrose  25 mL Intravenous Once  . enoxaparin  30 mg Subcutaneous Q24H  . homatropine  1 drop Right Eye TID  . imipenem-cilastatin  250 mg Intravenous Q12H  . insulin aspart  1-14 Units Subcutaneous TID WC  . insulin glargine  20 Units Subcutaneous QHS  . lisinopril  40 mg Oral Daily  . loteprednol  1 drop Right Eye QID  . metoCLOPramide (REGLAN) injection  10 mg Intravenous Q8H  . multivitamin  1 tablet Oral Daily  . ondansetron  4 mg Intravenous Once  . PARoxetine  40 mg Oral Q0700  . prednisoLONE acetate  1 drop Right Eye BID  . sevelamer  1,600 mg Oral TID WC  . sodium bicarbonate  650 mg Oral BID  . sucralfate  1 g Oral BID AC  . timolol  1 drop Right Eye  BID  . torsemide  100 mg Oral Daily  . travoprost (benzalkonium)  1 drop Right Eye QHS  . traZODone  200 mg Oral QHS  . valsartan-hydrochlorothiazide  1 tablet Oral Daily  . vancomycin  1,000 mg Intravenous Once  . vancomycin  1,000 mg Intravenous Q T,Th,Sa-HD  . DISCONTD: azithromycin  500 mg Intravenous Q24H  . DISCONTD: cefTRIAXone (ROCEPHIN)  IV  1 g Intravenous Q24H   Assessment:  Empiric therapy for PNA.  Goal of Therapy:  Vancomycin trough level 15-20 mcg/ml  Plan:  Vancomycin 1000 mg iv every HD. Primaxin 250 mg iv every 12 hours  Joseff Luckman, [place] J 08/20/2011,2:18 PM

## 2011-08-20 NOTE — ED Notes (Signed)
I called pt's sister to inform her of the room number

## 2011-08-20 NOTE — ED Notes (Signed)
Pt c/o n/v, sore throat, headache, fever, stomach pain, and pain in her chest when she coughs. Pt non compliant with dialysis.

## 2011-08-20 NOTE — Progress Notes (Addendum)
Had to place a second arterial needle.  Patient dislodged her first arterial needle by having to repeatedly sit up on the side of the bed because her back hurts.  Patient states she is unable to lay still for any period of time.  No blood loss from dislodged needle.  Needle remained in Patient's arm but was not threaded in her graft.  Ann Pursifull RN

## 2011-08-20 NOTE — Progress Notes (Addendum)
Patient states she dialyzes at the [facility] outpatient dialysis clinic in [place].  Will request outpatient care plan and hepatitis status on Wednesday 08-22-11.   Started HD via Left UA AVG.  After approx. 35 minutes on the machine patient stated that she needed to go to the bathroom.  Patient was offered a bed pan but refused.  I explained to patient that she would have to come off machine to go to the bathroom...she insisted on coming off machine.  Rinsed blood back and took patient upstairs to her room so she could use the bathroom.  Pt's nurse brought patient back to dialysis.  Dialysis was resumed at 1843. Ann Pursifull RN

## 2011-08-20 NOTE — H&P (Addendum)
PCP:   Ernestine Conrad, MD   Chief Complaint:  nausea and vomiting for 5 days  HPI: This is a 40 year old female with a very complicated medical history, currently history mainly obtained from the sister who is the caregiver, patient has a history of end-stage renal disease on hemodialysis Tuesday, Thursday and Saturday, diabetic gastroparesis, and type 1 diabetes mellitus, which apparently un controlled as the patient developed a right vitreous hemorrhage status post many laser surgeries, she also suffered from depression and anxiety and bear family she has insomnia, she usually wakes up most of the night and sleep in the morning, currently I cannot obtain any history from the patient herself as she is so sleepy, but she is arousable, and she is oriented, has been fairly he became nauseated with vomiting greenish to yellowish vomitus associated with diffuse abdominal pain but no diarrhea, she has a regular bowel movement yesterday, she had previous admission for the same problem and diagnosed as diabetic gastroparesis she also complained of shortness of breath and cough, she ALT did also notice some decreased appetite and sleep disturbance and significant swelling of her lower extremities, from it denies any hemoptysis or hematemesis or melena, and the family describes that the patient last dialysis will loss on December 15 as she refused to go and because of her sleep patterns, she denies any chest pain she complain of chronic headache, denies any significant shortness of breath, denies any numbness or weakness of her extremities, denies any balance deficit, denies any syncope, denies any heartburn  Review of Systems:  As per history of present illness Past Medical History: Past Medical History  Diagnosis Date  . DVT (deep venous thrombosis)     DVT HISTORY  . Renal failure   . Diabetes mellitus   . Migraine     HISTORY  . Anxiety and depression   . CAD (coronary artery disease)   . Bipolar 1  disorder   . Nephrotic syndrome     IN THE PAST  . Hyperlipidemia   . Hyperparathyroidism   . PE (pulmonary embolism)     HISTORY  . Dialysis patient   . Anemia    Past Surgical History  Procedure Date  . Back surgery X 4  . Cholecystectomy   . Incise and drain abcess     OF THIGHS FROM INSULIN INJECTIONS  . Heart stents     3 - 4  STENTS PLACED  . Portacath placement 2003  . Eye surgery     Medications: Prior to Admission medications   Medication Sig Start Date End Date Taking? Authorizing Provider  albuterol (PROVENTIL HFA;VENTOLIN HFA) 108 (90 BASE) MCG/ACT inhaler Inhale 2 puffs into the lungs every 6 (six) hours as needed. Shortness of breath     Yes Historical Provider, MD  ALPRAZolam (XANAX) 1 MG tablet Take 1 mg by mouth 4 (four) times daily as needed. For anxiety    Yes Historical Provider, MD  amLODipine (NORVASC) 10 MG tablet Take 10 mg by mouth daily.     Yes Historical Provider, MD  ARIPiprazole (ABILIFY) 2 MG tablet Take 2 mg by mouth daily.     Yes Historical Provider, MD  atropine 1 % ophthalmic solution Place 1 drop into the right eye 4 (four) times daily.     Yes Historical Provider, MD  b complex-vitamin c-folic acid (NEPHRO-VITE) 0.8 MG TABS Take 0.8 mg by mouth at bedtime.     Yes Historical Provider, MD  brimonidine (ALPHAGAN) 0.15 % ophthalmic  solution Place 1 drop into the right eye 2 (two) times daily.     Yes Historical Provider, MD  brinzolamide (AZOPT) 1 % ophthalmic suspension Place 1 drop into the right eye 3 (three) times daily.     Yes Historical Provider, MD  butalbital-acetaminophen-caffeine (FIORICET, ESGIC) 50-325-40 MG per tablet Take 1 tablet by mouth every 6 (six) hours as needed. For pain   Yes Historical Provider, MD  cinacalcet (SENSIPAR) 30 MG tablet Take 30 mg by mouth daily.     Yes Historical Provider, MD  cloNIDine (CATAPRES) 0.2 MG tablet Take 0.2 mg by mouth 2 (two) times daily.     Yes Historical Provider, MD  clopidogrel  (PLAVIX) 75 MG tablet Take 75 mg by mouth daily.     Yes Historical Provider, MD  doxycycline (DORYX) 100 MG DR capsule Take 100 mg by mouth 2 (two) times daily.     Yes Historical Provider, MD  homatropine 5 % ophthalmic solution Place 1 drop into the right eye 3 (three) times daily.     Yes Historical Provider, MD  insulin glargine (LANTUS) 100 UNIT/ML injection Inject 50 Units into the skin at bedtime.    Yes Historical Provider, MD  insulin lispro (HUMALOG) 100 UNIT/ML injection Inject 7-15 Units into the skin 4 (four) times daily -  before meals and at bedtime. TAKING ON SLIDING SCALE 200-249=7 units 250-299=10 units 300-349=12 units >350=15 units   Yes Historical Provider, MD  lisinopril (PRINIVIL,ZESTRIL) 40 MG tablet Take 40 mg by mouth daily.     Yes Historical Provider, MD  loteprednol (LOTEMAX) 0.2 % SUSP Place 1 drop into the right eye 4 (four) times daily.     Yes Historical Provider, MD  metoCLOPramide (REGLAN) 10 MG tablet 20 mg four times a day by mouth. 06/26/11  Yes Malissa Hippo, MD  moxifloxacin (VIGAMOX) 0.5 % ophthalmic solution Place 1 drop into the right eye 3 (three) times daily.     Yes Historical Provider, MD  omeprazole (PRILOSEC) 40 MG capsule Take 1 capsule (40 mg total) by mouth daily before breakfast. Take (1) Capsule Twice Daily 30 Minutes Before Breakfast and 30 Minutes Before Supper 06/26/11  Yes Malissa Hippo, MD  oxyCODONE-acetaminophen (PERCOCET) 10-325 MG per tablet Take 1 tablet by mouth every 4 (four) hours as needed. For pain    Yes Historical Provider, MD  PARoxetine (PAXIL) 40 MG tablet Take 40 mg by mouth every morning.    Yes Historical Provider, MD  prednisoLONE acetate (PRED FORTE) 1 % ophthalmic suspension Place 1 drop into the right eye 2 (two) times daily.     Yes Historical Provider, MD  promethazine (PHENERGAN) 25 MG tablet Take 25 mg by mouth every 6 (six) hours as needed. nausea   Yes Historical Provider, MD  sevelamer (RENVELA) 800 MG  tablet Take 4,000 mg by mouth 3 (three) times daily with meals.    Yes Historical Provider, MD  sodium bicarbonate 650 MG tablet Take 650 mg by mouth 2 (two) times daily.     Yes Historical Provider, MD  sucralfate (CARAFATE) 1 G tablet Take 1 g by mouth 2 (two) times daily before a meal.    Yes Historical Provider, MD  timolol (BETIMOL) 0.5 % ophthalmic solution Place 1 drop into the right eye 2 (two) times daily.     Yes Historical Provider, MD  torsemide (DEMADEX) 100 MG tablet Take 100 mg by mouth daily.     Yes Historical Provider, MD  travoprost,  benzalkonium, (TRAVATAN) 0.004 % ophthalmic solution Place 1 drop into the right eye at bedtime.     Yes Historical Provider, MD  traZODone (DESYREL) 100 MG tablet Take 200 mg by mouth at bedtime.    Yes Historical Provider, MD  valsartan-hydrochlorothiazide (DIOVAN-HCT) 320-12.5 MG per tablet Take 1 tablet by mouth daily.     Yes Historical Provider, MD  metoCLOPramide (REGLAN) 10 MG tablet Take 10 mg by mouth. Two 10 mg tablets four times daily    Historical Provider, MD    Allergies:   Allergies  Allergen Reactions  . Compazine (Prochlorperazine) Swelling    Per echart records  . Contrast Media (Iodinated Diagnostic Agents)     Per echart records  . Iodine     Reaction unknown  . Meperidine Hcl Swelling  . Metformin And Related   . Morphine Swelling  . Nubain (Nalbuphine Hcl) Swelling  . Topiramate (Topamax) Swelling    Tongue swelling - per echart records  . Toradol (Ketorolac Tromethamine) Swelling    Social History:  reports that she has been smoking Cigarettes.  She has a 40 pack-year smoking history. She has never used smokeless tobacco. She reports that she does not drink alcohol or use illicit drugs. she leaves with her mom and sister who are the caregivers  Family History: History reviewed. No pertinent family history.  Physical Exam: Filed Vitals:   08/20/11 0827 08/20/11 0949 08/20/11 1212 08/20/11 1312  BP: 148/73   145/76 159/79  Pulse: 78 81 90 85  Temp:      TempSrc:      Resp: 16 18 18 18   Height:    5' 10.5" (1.791 m)  Weight:    81.647 kg (180 lb)  SpO2: 92% 90% 92% 96%   She is sleepy, but multiple respiratory distress or shortness of breath, not pale or jaundice notice redness over her right eye, mucosa moist, no oral thrush and no tonsillar hypertrophy Lung examination no rails or rhonchi or wheezing Abdomen many surgical Scars related to burn. Her abdomen mildly tender. No masses but it's still hard as if stool, no rebound tenderness no guarding Extremity there is significant cellulitis of her right leg out to the knee joint, there is also lateral ulcer on her ankle to be multiplied by 3 cm, no obvious discharge, no pus, peripheral pulses is kind of hard to assess she has significant edema, also she have significant edema of her left lower extremities and with chronic venous stasis changes CNS she is sleepy but very arousable, she is oriented to place time and person 1 hours her white of CVA she say she did not slept for the last 5 days, cranial nerves seem intact a  and as there is no evidence of neuror deficit Labs on Admission:   [facility] 08/20/11 1010  NA 131*  K 4.0  CL 91*  CO2 21  GLUCOSE 55*  BUN 112*  CREATININE 7.52*  CALCIUM 10.2  MG --  PHOS --    Basename 08/20/11 1010  AST 8  ALT 11  ALKPHOS 227*  BILITOT 0.1*  PROT 6.9  ALBUMIN 2.6*    Basename 08/20/11 1010  LIPASE 11  AMYLASE --    Basename 08/20/11 1010  WBC 8.6  NEUTROABS --  HGB 10.2*  HCT 30.1*  MCV 102.4*  PLT 361   No results found for this basename: CKTOTAL:3,CKMB:3,CKMBINDEX:3,TROPONINI:3 in the last 72 hours No results found for this basename: TSH,T4TOTAL,FREET3,T3FREE,THYROIDAB in the last 72 hours No  results found for this basename: VITAMINB12:2,FOLATE:2,FERRITIN:2,TIBC:2,IRON:2,RETICCTPCT:2 in the last 72 hours  Radiological Exams on Admission: Dg Chest Portable 1 View  08/20/2011   *RADIOLOGY REPORT*  Clinical Data: Shortness of breath.  PORTABLE CHEST - 1 VIEW  Comparison: 05/05/2011  Findings: Consolidation noted in the right lower lobe compatible with pneumonia.  Possible small right effusion.  Heart is mildly enlarged.  Right Port-A-Cath remains in place, unchanged.  Left lung is clear.  No acute bony abnormality.  IMPRESSION: Right lower lobe consolidation concerning for pneumonia.  Suspect small right effusion.  Original Report Authenticated By: Cyndie Chime, M.D.    Assessment/Plan 1-nausea and vomiting and abdominal pain likely secondary to gastroparesis, we'll start the patient on clear liquid diet, I would check KUB, will start the patient on Reglan and Phenergan IV 2-sleepy but arousable could be secondary to her sleeping pattern but am worried about uremic encephalopathy although she is fully oriented we would proceed with consulting nephrology to resume dialysis 3-pneumonia would get the flu and a, I would start Primaxin and vancomycin per pharmacy 4-significant cellulitis and edema of her lower extremities this could likely resolve with dialysis, and patient also has low albumin, I would continue with Primaxin and vancomycin and get set of blood culture, also venous  Doppler will be done, wound care will be consulted 5-diabetes would resume insulin Lantus at a lower rate and insulin sliding scale and advance as patient diet advanced 6-hypertension would resume home medication; depression and anxiety I would resume patient home medication although when he to be careful with the patient is sleepy at this time further recommendation per hospital course progressed .  Herlinda Heady I. 08/20/2011, 2:20 PM

## 2011-08-20 NOTE — ED Notes (Signed)
Port a cath attempted x 2 with no success; Dr. Lovell Sheehan informed of need for access and he is to try to access

## 2011-08-20 NOTE — ED Notes (Signed)
Pts sister who is partial caregiver of her sister states that her sister has been refusing dialysis and barely may take her meds 1 x a day. Pt was supposed to go to dialysis today but started "dry heaving" and saying she needed to go to the hospital. Sister states she knew that her sister was going to lie and say she was sick so she didn't have to go to dialysis. Sister states that all pt does all day is smoke (2pks a day), and taker her xanax and pain pills every 4 hrs. Pt constantly looks down at the floor and will not make eye contact. Pts sister also states her sister has been eating soap. Sister feels like her sister is giving up on life.Sister states her and her mother are unable to care for the pt and do not know what to do. Family concerned for patient and her health and well being.

## 2011-08-20 NOTE — ED Provider Notes (Signed)
History   This chart was scribed for Shelda Jakes, MD by Clarita Crane. The patient was seen in room APA10/APA10 and the patient's care was started at 7:44AM.  CSN: [id number]  Arrival date & time 08/20/11  0407   First MD Initiated Contact with Patient 08/20/11 0715      Chief Complaint  Patient presents with  . Nausea  . Emesis  . Headache  . Sore Throat  . Abdominal Pain  . Chest Pain    (Consider location/radiation/quality/duration/timing/severity/associated sxs/prior treatment) HPI [name] is a 40 y.o. female who presents to the Emergency Department complaining of constant moderate to severe flu like symptoms including sore throat, nausea, vomiting, HA, abdominal pain, cough and chest soreness with cough onset several days ago and persistent since. Also notes patient with associated decreased appetite, sleep disturbance, swelling of extremities. Family describes emesis as watery. Patient on hemodialysis with last dialysis treatment received 9 days ago. Family member states she has missed her last 3 dialysis treatments (scheduled for dialysis today). Patient with h/o gastroparesis, DVT, PE, anemia,  renal failure, diabetes, CAD, nephrotic syndrome, HLD.  PCP-Dr. Belva Agee  Past Medical History  Diagnosis Date  . DVT (deep venous thrombosis)     DVT HISTORY  . Renal failure   . Diabetes mellitus   . Migraine     HISTORY  . Anxiety and depression   . CAD (coronary artery disease)   . Bipolar 1 disorder   . Nephrotic syndrome     IN THE PAST  . Hyperlipidemia   . Hyperparathyroidism   . PE (pulmonary embolism)     HISTORY  . Dialysis patient   . Anemia     Past Surgical History  Procedure Date  . Back surgery X 4  . Cholecystectomy   . Incise and drain abcess     OF THIGHS FROM INSULIN INJECTIONS  . Heart stents     3 - 4  STENTS PLACED  . Portacath placement 2003  . Eye surgery     History reviewed. No pertinent family history.  History    Substance Use Topics  . Smoking status: Current Everyday Smoker -- 2.0 packs/day for 20 years    Types: Cigarettes  . Smokeless tobacco: Never Used  . Alcohol Use: No    OB History    Grav Para Term Preterm Abortions TAB SAB Ect Mult Living                  Review of Systems  Constitutional: Positive for fever and appetite change.  HENT: Positive for sore throat. Negative for rhinorrhea.   Eyes: Negative for pain.  Respiratory: Positive for cough. Negative for shortness of breath.        Chest soreness.   Cardiovascular: Negative for chest pain.  Gastrointestinal: Positive for nausea and vomiting. Negative for abdominal pain and diarrhea.  Genitourinary: Negative for dysuria.  Musculoskeletal: Negative for back pain.  Skin: Negative for rash.  Neurological: Positive for headaches. Negative for weakness.    Allergies  Compazine; Contrast media; Iodine; Meperidine hcl; Metformin and related; Morphine; Nubain; Topiramate; and Toradol  Home Medications   Current Outpatient Rx  Name Route Sig Dispense Refill  . ALBUTEROL SULFATE HFA 108 (90 BASE) MCG/ACT IN AERS Inhalation Inhale 2 puffs into the lungs every 6 (six) hours as needed. Shortness of breath      . ALPRAZOLAM 1 MG PO TABS Oral Take 1 mg by mouth 4 (four) times daily as  needed. For anxiety     . AMLODIPINE BESYLATE 10 MG PO TABS Oral Take 10 mg by mouth daily.      . ARIPIPRAZOLE 2 MG PO TABS Oral Take 2 mg by mouth daily.      . ATROPINE SULFATE 1 % OP SOLN Right Eye Place 1 drop into the right eye 4 (four) times daily.      Marland Kitchen NEPHRO-VITE 0.8 MG PO TABS Oral Take 0.8 mg by mouth at bedtime.      Marland Kitchen BRIMONIDINE TARTRATE 0.15 % OP SOLN Right Eye Place 1 drop into the right eye 2 (two) times daily.      Marland Kitchen BRINZOLAMIDE 1 % OP SUSP Right Eye Place 1 drop into the right eye 3 (three) times daily.      Marland Kitchen BUTALBITAL-APAP-CAFFEINE 50-325-40 MG PO TABS Oral Take 1 tablet by mouth every 6 (six) hours as needed. For pain    .  CINACALCET HCL 30 MG PO TABS Oral Take 30 mg by mouth daily.      Marland Kitchen CLONIDINE HCL 0.2 MG PO TABS Oral Take 0.2 mg by mouth 2 (two) times daily.      Marland Kitchen CLOPIDOGREL BISULFATE 75 MG PO TABS Oral Take 75 mg by mouth daily.      Marland Kitchen DOXYCYCLINE HYCLATE 100 MG PO CPEP Oral Take 100 mg by mouth 2 (two) times daily.      Marland Kitchen HOMATROPINE HBR 5 % OP SOLN Right Eye Place 1 drop into the right eye 3 (three) times daily.      . INSULIN GLARGINE 100 UNIT/ML [place] SOLN Subcutaneous Inject 50 Units into the skin at bedtime.     . INSULIN LISPRO (HUMAN) 100 UNIT/ML [place] SOLN Subcutaneous Inject 7-15 Units into the skin 4 (four) times daily -  before meals and at bedtime. TAKING ON SLIDING SCALE 200-249=7 units 250-299=10 units 300-349=12 units >350=15 units    . LISINOPRIL 40 MG PO TABS Oral Take 40 mg by mouth daily.      Marland Kitchen LOTEPREDNOL ETABONATE 0.2 % OP SUSP Right Eye Place 1 drop into the right eye 4 (four) times daily.      Marland Kitchen METOCLOPRAMIDE HCL 10 MG PO TABS  20 mg four times a day by mouth. 240 tablet 2  . MOXIFLOXACIN HCL 0.5 % OP SOLN Right Eye Place 1 drop into the right eye 3 (three) times daily.      Marland Kitchen OMEPRAZOLE 40 MG PO CPDR Oral Take 1 capsule (40 mg total) by mouth daily before breakfast. Take (1) Capsule Twice Daily 30 Minutes Before Breakfast and 30 Minutes Before Supper 30 capsule 5  . OXYCODONE-ACETAMINOPHEN 10-325 MG PO TABS Oral Take 1 tablet by mouth every 4 (four) hours as needed. For pain     . PAROXETINE HCL 40 MG PO TABS Oral Take 40 mg by mouth every morning.     Marland Kitchen PREDNISOLONE ACETATE 1 % OP SUSP Right Eye Place 1 drop into the right eye 2 (two) times daily.      Marland Kitchen PROMETHAZINE HCL 25 MG PO TABS Oral Take 25 mg by mouth every 6 (six) hours as needed. nausea    . SEVELAMER CARBONATE 800 MG PO TABS Oral Take 4,000 mg by mouth 3 (three) times daily with meals.     . SODIUM BICARBONATE 650 MG PO TABS Oral Take 650 mg by mouth 2 (two) times daily.      . SUCRALFATE 1 G PO TABS Oral Take 1 g by  mouth 2 (two) times daily before a meal.     . TIMOLOL HEMIHYDRATE 0.5 % OP SOLN Right Eye Place 1 drop into the right eye 2 (two) times daily.      . TORSEMIDE 100 MG PO TABS Oral Take 100 mg by mouth daily.      . TRAVOPROST 0.004 % OP SOLN Right Eye Place 1 drop into the right eye at bedtime.      . TRAZODONE HCL 100 MG PO TABS Oral Take 200 mg by mouth at bedtime.     Marland Kitchen VALSARTAN-HYDROCHLOROTHIAZIDE 320-12.5 MG PO TABS Oral Take 1 tablet by mouth daily.      Marland Kitchen METOCLOPRAMIDE HCL 10 MG PO TABS Oral Take 10 mg by mouth. Two 10 mg tablets four times daily      BP 148/73  Pulse 81  Temp(Src) 97.5 F (36.4 C) (Oral)  Resp 18  Ht 5' 10.5" (1.791 m)  Wt 180 lb (81.647 kg)  BMI 25.46 kg/m2  SpO2 90%  Physical Exam  Nursing note and vitals reviewed. Constitutional: She is oriented to person, place, and time. She appears well-developed and well-nourished. No distress.  HENT:  Head: Normocephalic and atraumatic.  Mouth/Throat: Oropharynx is clear and moist.       Moist mucous membranes.   Eyes: EOM are normal. Pupils are equal, round, and reactive to light. No scleral icterus.  Neck: Neck supple. No tracheal deviation present.  Cardiovascular: Normal rate and regular rhythm.  Exam reveals no friction rub.   No murmur heard. Pulmonary/Chest: Effort normal and breath sounds normal. No respiratory distress. She has no wheezes.  Abdominal: Soft. Bowel sounds are normal. She exhibits no distension. There is no tenderness.  Musculoskeletal: Normal range of motion. She exhibits no edema.       Marked 2+ pitting edema to bilateral lower extremities with diffuse erythema to lower extremities. Erythema to RLE greater than LLE.   Neurological: She is alert and oriented to person, place, and time. No sensory deficit.       Distally neurovascularly intact.   Skin: Skin is warm and dry.  Psychiatric: She has a normal mood and affect. Her behavior is normal.    ED Course  Procedures (including  critical care time)  DIAGNOSTIC STUDIES: Oxygen Saturation is 100% on room air, normal by my interpretation.    COORDINATION OF CARE:    Results for orders placed during the hospital encounter of 08/20/11  PRO B NATRIURETIC PEPTIDE      Component Value Range   Pro B Natriuretic peptide (BNP) 4201.0 (*) 0 - 125 (pg/mL)  BASIC METABOLIC PANEL      Component Value Range   Sodium 131 (*) 135 - 145 (mEq/L)   Potassium 4.0  3.5 - 5.1 (mEq/L)   Chloride 91 (*) 96 - 112 (mEq/L)   CO2 21  19 - 32 (mEq/L)   Glucose, Bld 55 (*) 70 - 99 (mg/dL)   BUN PENDING  6 - 23 (mg/dL)   Creatinine, Ser 6.04 (*) 0.50 - 1.10 (mg/dL)   Calcium 54.0  8.4 - 10.5 (mg/dL)   GFR calc non Af Amer 6 (*) >90 (mL/min)   GFR calc Af Amer 7 (*) >90 (mL/min)  CBC      Component Value Range   WBC 8.6  4.0 - 10.5 (K/uL)   RBC 2.94 (*) 3.87 - 5.11 (MIL/uL)   Hemoglobin 10.2 (*) 12.0 - 15.0 (g/dL)   HCT 98.1 (*) 19.1 - 46.0 (%)  MCV 102.4 (*) 78.0 - 100.0 (fL)   MCH 34.7 (*) 26.0 - 34.0 (pg)   MCHC 33.9  30.0 - 36.0 (g/dL)   RDW 40.9 (*) 81.1 - 15.5 (%)   Platelets 361  150 - 400 (K/uL)  HEPATIC FUNCTION PANEL      Component Value Range   Total Protein 6.9  6.0 - 8.3 (g/dL)   Albumin 2.6 (*) 3.5 - 5.2 (g/dL)   AST 8  0 - 37 (U/L)   ALT 11  0 - 35 (U/L)   Alkaline Phosphatase 227 (*) 39 - 117 (U/L)   Total Bilirubin 0.1 (*) 0.3 - 1.2 (mg/dL)   Bilirubin, Direct 0.1  0.0 - 0.3 (mg/dL)   Indirect Bilirubin 0.0 (*) 0.3 - 0.9 (mg/dL)  LIPASE, BLOOD      Component Value Range   Lipase 11  11 - 59 (U/L)  CULTURE, BLOOD (ROUTINE X 2)      Component Value Range   Specimen Description Blood     Special Requests NONE     Culture NO GROWTH <24 HRS     Report Status PENDING      Dg Chest Portable 1 View  08/20/2011  *RADIOLOGY REPORT*  Clinical Data: Shortness of breath.  PORTABLE CHEST - 1 VIEW  Comparison: 05/05/2011  Findings: Consolidation noted in the right lower lobe compatible with pneumonia.  Possible  small right effusion.  Heart is mildly enlarged.  Right Port-A-Cath remains in place, unchanged.  Left lung is clear.  No acute bony abnormality.  IMPRESSION: Right lower lobe consolidation concerning for pneumonia.  Suspect small right effusion.  Original Report Authenticated By: Cyndie Chime, M.D.     Date: 08/20/2011  Rate: 78  Rhythm: normal sinus rhythm  QRS Axis: normal  Intervals: normal  ST/T Wave abnormalities: normal  Conduction Disutrbances:none  Narrative Interpretation:   Old EKG Reviewed: unchanged 7 change in EKG from 12/07/2010  Results for orders placed during the hospital encounter of 08/20/11  PRO B NATRIURETIC PEPTIDE      Component Value Range   Pro B Natriuretic peptide (BNP) 4201.0 (*) 0 - 125 (pg/mL)  BASIC METABOLIC PANEL      Component Value Range   Sodium 131 (*) 135 - 145 (mEq/L)   Potassium 4.0  3.5 - 5.1 (mEq/L)   Chloride 91 (*) 96 - 112 (mEq/L)   CO2 21  19 - 32 (mEq/L)   Glucose, Bld 55 (*) 70 - 99 (mg/dL)   BUN PENDING  6 - 23 (mg/dL)   Creatinine, Ser 9.14 (*) 0.50 - 1.10 (mg/dL)   Calcium 78.2  8.4 - 10.5 (mg/dL)   GFR calc non Af Amer 6 (*) >90 (mL/min)   GFR calc Af Amer 7 (*) >90 (mL/min)  CBC      Component Value Range   WBC 8.6  4.0 - 10.5 (K/uL)   RBC 2.94 (*) 3.87 - 5.11 (MIL/uL)   Hemoglobin 10.2 (*) 12.0 - 15.0 (g/dL)   HCT 95.6 (*) 21.3 - 46.0 (%)   MCV 102.4 (*) 78.0 - 100.0 (fL)   MCH 34.7 (*) 26.0 - 34.0 (pg)   MCHC 33.9  30.0 - 36.0 (g/dL)   RDW 08.6 (*) 57.8 - 15.5 (%)   Platelets 361  150 - 400 (K/uL)  HEPATIC FUNCTION PANEL      Component Value Range   Total Protein 6.9  6.0 - 8.3 (g/dL)   Albumin 2.6 (*) 3.5 - 5.2 (g/dL)   AST  8  0 - 37 (U/L)   ALT 11  0 - 35 (U/L)   Alkaline Phosphatase 227 (*) 39 - 117 (U/L)   Total Bilirubin 0.1 (*) 0.3 - 1.2 (mg/dL)   Bilirubin, Direct 0.1  0.0 - 0.3 (mg/dL)   Indirect Bilirubin 0.0 (*) 0.3 - 0.9 (mg/dL)  LIPASE, BLOOD      Component Value Range   Lipase 11  11 - 59 (U/L)   CULTURE, BLOOD (ROUTINE X 2)      Component Value Range   Specimen Description Blood     Special Requests NONE     Culture NO GROWTH <24 HRS     Report Status PENDING       1. CAP (community acquired pneumonia)   2. Renal failure   3. Cellulitis     CRITICAL CARE Performed by: Shelda Jakes.   Total critical care time: 30  Critical care time was exclusive of separately billable procedures and treating other patients.  Critical care was necessary to treat or prevent imminent or life-threatening deterioration.  Critical care was time spent personally by me on the following activities: development of treatment plan with patient and/or surrogate as well as nursing, discussions with consultants, evaluation of patient's response to treatment, examination of patient, obtaining history from patient or surrogate, ordering and performing treatments and interventions, ordering and review of laboratory studies, ordering and review of radiographic studies, pulse oximetry and re-evaluation of patient's condition.   MDM   Patient noncompliant with dialysis has not had processes December 15, despite that potassium is up 4.0 chest x-ray without any sniffing fluid overload even though she is marked lower trimming the edema consistent with a full body fluid overload. Chest x-ray reveals a right lower lobe pneumonia. Patient blood cultures done started on IV Rocephin and Zithromax and vancomycin. Vancomycin is for the lower be cellulitis. There is a sniffing cellulitis to her right lower trimming in the area of the tibia. Blood sugar was also low on the low side blood sugar 55 patient will be given not have happened D50 for that. Some somnolence there may be due to uremia we'll see if his improvement after the sugar.  Discussed with the triad hospitalist and they will mid to telemetry team 2. This is pulse ox is stable blood pressure is stable, LFTs without any significant findings. Patient is  usually followed in [place]      I personally performed the services described in this documentation, which was scribed in my presence. The recorded information has been reviewed and considered.     Shelda Jakes, MD 08/20/11 1154

## 2011-08-20 NOTE — Procedures (Signed)
Due to nursing difficulty in accessing portacath, I was asked to see patient to access port.  Patient had power port in place in the right upper chest.  Accessed using sterile technique without difficulty with Demetrios Isaacs needle.  Good back flow noted.  Flushed with saline and left accessed.  May use port for admission.

## 2011-08-20 NOTE — Progress Notes (Signed)
Spoke with px mother  45 who stated she would bring a copy of px's Health Care POA with her the next time she comes to visit with daughter. [name] [name]

## 2011-08-21 LAB — COMPREHENSIVE METABOLIC PANEL
ALT: 13 U/L (ref 0–35)
Alkaline Phosphatase: 212 U/L — ABNORMAL HIGH (ref 39–117)
CO2: 30 mEq/L (ref 19–32)
GFR calc Af Amer: 18 mL/min — ABNORMAL LOW (ref >90)
GFR calc non Af Amer: 15 mL/min — ABNORMAL LOW (ref >90)
Glucose, Bld: 88 mg/dL (ref 70–99)
Potassium: 3.7 mEq/L (ref 3.5–5.1)
Sodium: 136 mEq/L (ref 135–145)
Total Bilirubin: 0.1 mg/dL — ABNORMAL LOW (ref 0.3–1.2)

## 2011-08-21 LAB — PHOSPHORUS: Phosphorus: 3.7 mg/dL (ref 2.3–4.6)

## 2011-08-21 LAB — GLUCOSE, CAPILLARY
Glucose-Capillary: 106 mg/dL — ABNORMAL HIGH (ref 70–99)
Glucose-Capillary: 103 mg/dL — ABNORMAL HIGH (ref 70–99)
Glucose-Capillary: 111 mg/dL — ABNORMAL HIGH (ref 70–99)

## 2011-08-21 LAB — CBC
MCV: 102.7 fL — ABNORMAL HIGH (ref 78.0–100.0)
Platelets: 329 10*3/uL (ref 150–400)
RDW: 15.5 % (ref 11.5–15.5)
WBC: 9.6 10*3/uL (ref 4.0–10.5)

## 2011-08-21 LAB — HEPATITIS B SURFACE ANTIGEN: Hepatitis B Surface Ag: NEGATIVE

## 2011-08-21 LAB — INFLUENZA PANEL BY PCR (TYPE A & B): Influenza B By PCR: NEGATIVE

## 2011-08-21 MED ORDER — OXYCODONE-ACETAMINOPHEN 5-325 MG PO TABS
1.0000 | ORAL_TABLET | Freq: Four times a day (QID) | ORAL | Status: DC | PRN
  Administered 2011-08-21 (×2): 2 via ORAL
  Administered 2011-08-22: 1 via ORAL
  Administered 2011-08-22 – 2011-08-23 (×2): 2 via ORAL
  Filled 2011-08-21 (×5): qty 2

## 2011-08-21 NOTE — Progress Notes (Signed)
CBG: 67  Treatment: 15 GM carbohydrate snack  Symptoms: None  Follow-up CBG: Time:1205 CBG Result:106  Possible Reasons for Event: Inadequate meal intake  Comments/MD notified: pt on clear liquids, pt very tired and not eating.  Pt encouraged to sip on juice and fluids    Greggory Stallion, Jeanella Anton

## 2011-08-21 NOTE — Progress Notes (Signed)
Pt placed on droplet precautions at - sent down flu PCR ordered by MD.  Results negative, precautions D/C'd

## 2011-08-21 NOTE — Progress Notes (Signed)
Subjective:  She is feeling better today, she admitted she does not have any nausea or vomiting but she has epigastric abdominal pain, she is more awake than yesterday she denies any chest pain or shortness of breath. Objective: Vital signs in last 24 hours: Filed Vitals:   08/20/11 2200 08/20/11 2217 08/21/11 0508 08/21/11 0511  BP: 138/63 159/86 181/79   Pulse: 82 80 81   Temp:  98.2 F (36.8 C) 97.8 F (36.6 C)   TempSrc:   Oral   Resp:   18   Height:      Weight:    83 kg (182 lb 15.7 oz)  SpO2:  97% 97%    Weight change: 0 kg (0 lb)  Intake/Output Summary (Last 24 hours) at 08/21/11 1117 Last data filed at 08/21/11 1610  Gross per 24 hour  Intake    120 ml  Output   2390 ml  Net  -2270 ml    She is sitting comfortably not on respiratory distress, right conjunctiva is is swelling and redness, mouth with dentures, no oral thrush, neck supple with no lymph nodes heart S1 and S2 with no added sounds, lung examination normal breathing without rales or wheezing abdomen soft mild tenderness as epigastric, bowel sounds present nontender  Extremities decreased on bilateral edema, venous stasis of the left lower extremity, a decrease of cellulitis of the right lower extremity compared to yesterday, she has 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 3 multiplied by 3 cm with no obvious  pus,  Lab Results:  [facility] 08/21/11 0533 08/20/11 1010  NA 136 131*  K 3.7 4.0  CL 96 91*  CO2 30 21  GLUCOSE 88 55*  BUN 34* 112*  CREATININE 3.45* 7.52*  CALCIUM 8.8 10.2  MG -- --  PHOS 3.7 --    Basename 08/21/11 0533 08/20/11 1010  AST 14 8  ALT 13 11  ALKPHOS 212* 227*  BILITOT 0.1* 0.1*  PROT 6.5 6.9  ALBUMIN 2.5* 2.6*    Basename 08/20/11 1010  LIPASE 11  AMYLASE --    Basename 08/21/11 0533 08/20/11 1010  WBC 9.6 8.6  NEUTROABS -- --  HGB 9.9* 10.2*  HCT 29.9* 30.1*  MCV 102.7* 102.4*  PLT 329 361   No results found for this basename:  CKTOTAL:3,CKMB:3,CKMBINDEX:3,TROPONINI:3 in the last 72 hours No components found with this basename: POCBNP:3 No results found for this basename: DDIMER:2 in the last 72 hours No results found for this basename: HGBA1C:2 in the last 72 hours No results found for this basename: CHOL:2,HDL:2,LDLCALC:2,TRIG:2,CHOLHDL:2,LDLDIRECT:2 in the last 72 hours No results found for this basename: TSH,T4TOTAL,FREET3,T3FREE,THYROIDAB in the last 72 hours No results found for this basename: VITAMINB12:2,FOLATE:2,FERRITIN:2,TIBC:2,IRON:2,RETICCTPCT:2 in the last 72 hours  Micro Results: Recent Results (from the past 240 hour(s))  CULTURE, BLOOD (ROUTINE X 2)     Status: Normal (Preliminary result)   Collection Time   08/20/11 10:10 AM      Component Value Range Status Comment   Specimen Description BLOOD RIGHT ARM   Final    Special Requests BOTTLES DRAWN AEROBIC AND ANAEROBIC 6CC   Final    Culture NO GROWTH 1 DAY   Final    Report Status PENDING   Incomplete   CULTURE, BLOOD (ROUTINE X 2)     Status: Normal (Preliminary result)   Collection Time   08/20/11 11:55 AM      Component Value Range Status Comment   Specimen Description BLOOD RIGHT ARM   Final  Special Requests BOTTLES DRAWN AEROBIC AND ANAEROBIC 6CC   Final    Culture NO GROWTH 1 DAY   Final    Report Status PENDING   Incomplete   MRSA PCR SCREENING     Status: Normal   Collection Time   08/20/11  3:02 PM      Component Value Range Status Comment   MRSA by PCR NEGATIVE  NEGATIVE  Final     Studies/Results: Dg Chest Portable 1 View  08/20/2011  *RADIOLOGY REPORT*  Clinical Data: Shortness of breath.  PORTABLE CHEST - 1 VIEW  Comparison: 05/05/2011  Findings: Consolidation noted in the right lower lobe compatible with pneumonia.  Possible small right effusion.  Heart is mildly enlarged.  Right Port-A-Cath remains in place, unchanged.  Left lung is clear.  No acute bony abnormality.  IMPRESSION: Right lower lobe consolidation  concerning for pneumonia.  Suspect small right effusion.  Original Report Authenticated By: Cyndie Chime, M.D.   Dg Abd 2 Views  08/20/2011  *RADIOLOGY REPORT*  Clinical Data: Abdominal pain.  Nausea and vomiting.  Renal failure.  ABDOMEN - 2 VIEW  Comparison: 12/28/2010  Findings: Several gas filled nondilated small bowel loops are seen in the left abdomen containing air fluid levels.  Scattered colonic gas is seen without evidence of dilatation.  There is no evidence of free intraperitoneal air.  Small right pleural effusion is noted.  Surgical clips are seen from prior cholecystectomy and posterior lumbar spine fusion hardware is noted.  Peripheral vascular calcification is seen involve the iliac arteries.  Pelvic phleboliths noted, but no definite radiopaque calculi identified.  IMPRESSION:  Nonspecific, nonobstructive bowel gas pattern.  Original Report Authenticated By: Danae Orleans, M.D.    Medications: I have reviewed the patient's current medications. Scheduled Meds:   . amLODipine  10 mg Oral Daily  . ARIPiprazole  2 mg Oral Daily  . atropine  1 drop Right Eye QID  . brimonidine  1 drop Right Eye BID  . brinzolamide  1 drop Right Eye TID  . cinacalcet  30 mg Oral Daily  . cloNIDine  0.2 mg Oral BID  . clopidogrel  75 mg Oral Daily  . dextrose  25 mL Intravenous Once  . enoxaparin  30 mg Subcutaneous Daily  . epoetin alfa  4,000 Units Intravenous 3 times weekly  . homatropine  1 drop Right Eye TID  . hydrochlorothiazide  12.5 mg Oral Daily  . imipenem-cilastatin  250 mg Intravenous Q12H  . insulin aspart  1-14 Units Subcutaneous TID WC  . insulin glargine  20 Units Subcutaneous QHS  . lisinopril  40 mg Oral Daily  . loteprednol  1 drop Right Eye QID  . metoCLOPramide (REGLAN) injection  10 mg Intravenous Q8H  . multivitamin  1 tablet Oral Daily  . olmesartan  40 mg Oral Daily  . PARoxetine  40 mg Oral Q0700  . prednisoLONE acetate  1 drop Right Eye BID  . sevelamer  1,600  mg Oral TID WC  . sodium bicarbonate  650 mg Oral BID  . sucralfate  1 g Oral BID AC  . timolol  1 drop Right Eye BID  . torsemide  100 mg Oral Daily  . travoprost (benzalkonium)  1 drop Right Eye QHS  . traZODone  200 mg Oral QHS  . vancomycin  1,000 mg Intravenous Once  . vancomycin  1,000 mg Intravenous Once  . vancomycin  1,000 mg Intravenous Q M,W,F-HD  . DISCONTD: azithromycin  500 mg Intravenous Q24H  . DISCONTD: cefTRIAXone (ROCEPHIN)  IV  1 g Intravenous Q24H  . DISCONTD: valsartan-hydrochlorothiazide  1 tablet Oral Daily  . DISCONTD: vancomycin  1,000 mg Intravenous Q T,Th,Sa-HD   Continuous Infusions:  PRN Meds:.sodium chloride, sodium chloride, alteplase, butalbital-acetaminophen-caffeine, feeding supplement (NEPRO CARB STEADY), heparin, heparin, HYDROmorphone (DILAUDID) injection, lidocaine, lidocaine-prilocaine, pentafluoroprop-tetrafluoroeth  Assessment/Plan: 1-right lung pneumonia  2-right leg cellulitis with chronic wound of the lateral leg will exclude osteomyelitis by MRI of the right ankle, in the meantime we'll continue Primaxin and vancomycin adjusted by pharmacy, the redness and swelling improved  3-chronic nausea and vomiting and epigastric pain secondary to gastroparesis would add Reglan  4-right vitreous hemorrhage will continue eye drops 5-end-stage renal disease on hemodialysis Tuesday Thursday and Saturday, noncompliance  6-hypertension seems uncontrolled we'll add valsartan,   7- non compliance 8 - depression and anxiety 9- history of DVT:  On plavix currently  LOS: 1 day  [name] I. 08/21/2011, 11:17 AM

## 2011-08-21 NOTE — Progress Notes (Signed)
[name]  MRN: [id number]  DOB/AGE: 02/24/1971 40 y.o.  Primary Care Physician:BLUTH,KIRK, MD  Admit date: 08/20/2011  Chief Complaint:  Chief Complaint  Patient presents with  . Nausea  . Emesis  . Headache  . Sore Throat  . Abdominal Pain  . Chest Pain    [name]-Pt presented on  08/20/2011 with  Chief Complaint  Patient presents with  . Nausea  . Emesis  . Headache  . Sore Throat  . Abdominal Pain  . Chest Pain  .    Pt today feels better   Meds    . amLODipine  10 mg Oral Daily  . ARIPiprazole  2 mg Oral Daily  . atropine  1 drop Right Eye QID  . brimonidine  1 drop Right Eye BID  . brinzolamide  1 drop Right Eye TID  . cinacalcet  30 mg Oral Daily  . cloNIDine  0.2 mg Oral BID  . clopidogrel  75 mg Oral Daily  . enoxaparin  30 mg Subcutaneous Daily  . epoetin alfa  4,000 Units Intravenous 3 times weekly  . homatropine  1 drop Right Eye TID  . hydrochlorothiazide  12.5 mg Oral Daily  . imipenem-cilastatin  250 mg Intravenous Q12H  . insulin aspart  1-14 Units Subcutaneous TID WC  . insulin glargine  20 Units Subcutaneous QHS  . lisinopril  40 mg Oral Daily  . loteprednol  1 drop Right Eye QID  . metoCLOPramide (REGLAN) injection  10 mg Intravenous Q8H  . multivitamin  1 tablet Oral Daily  . olmesartan  40 mg Oral Daily  . PARoxetine  40 mg Oral Q0700  . prednisoLONE acetate  1 drop Right Eye BID  . sevelamer  1,600 mg Oral TID WC  . sodium bicarbonate  650 mg Oral BID  . sucralfate  1 g Oral BID AC  . timolol  1 drop Right Eye BID  . torsemide  100 mg Oral Daily  . travoprost (benzalkonium)  1 drop Right Eye QHS  . traZODone  200 mg Oral QHS  . vancomycin  1,000 mg Intravenous Once  . vancomycin  1,000 mg Intravenous Once  . vancomycin  1,000 mg Intravenous Q M,W,F-HD  . DISCONTD: azithromycin  500 mg Intravenous Q24H  . DISCONTD: cefTRIAXone (ROCEPHIN)  IV  1 g Intravenous Q24H  . DISCONTD: valsartan-hydrochlorothiazide  1 tablet Oral Daily    . DISCONTD: vancomycin  1,000 mg Intravenous Q T,Th,Sa-HD         NWG:NFAOZ from the symptoms mentioned above,there are no other symptoms referable to all systems reviewed.  Physical Exam: Vital signs in last 24 hours: Temp:  [97.8 F (36.6 C)-98.7 F (37.1 C)] 97.8 F (36.6 C) (12/25 0508) Pulse Rate:  [77-87] 81  (12/25 0508) Resp:  [18-20] 18  (12/25 0508) BP: (93-181)/(53-86) 181/79 mmHg (12/25 0508) SpO2:  [95 %-97 %] 97 % (12/25 0508) Weight:  [180 lb (81.647 kg)-182 lb 15.7 oz (83 kg)] 182 lb 15.7 oz (83 kg) (12/25 0511) Weight change: 0 lb (0 kg) Last BM Date: 08/20/11  Intake/Output from previous day: 12/24 0701 - 12/25 0700 In: 120 [P.O.:120] Out: 2390 [Urine:450]     Physical Exam: General- pt is awake,alert,folows commands Resp- No acute REsp distress,Crackles + CVS- S1S2 regular in rate and rhythm GIT- BS+, soft, NT, ND EXT-2+ LE Edema, Right Leg chronic wound   Lab Results: CBC  Basename 08/21/11 0533 08/20/11 1010  WBC 9.6 8.6  HGB 9.9* 10.2*  HCT 29.9* 30.1*  PLT 329 361    BMET  Basename 08/21/11 0533 08/20/11 1010  NA 136 131*  K 3.7 4.0  CL 96 91*  CO2 30 21  GLUCOSE 88 55*  BUN 34* 112*  CREATININE 3.45* 7.52*  CALCIUM 8.8 10.2    MICRO Recent Results (from the past 240 hour([name]))  CULTURE, BLOOD (ROUTINE X 2)     Status: Normal (Preliminary result)   Collection Time   08/20/11 10:10 AM      Component Value Range Status Comment   Specimen Description BLOOD RIGHT ARM   Final    Special Requests BOTTLES DRAWN AEROBIC AND ANAEROBIC 6CC   Final    Culture NO GROWTH 1 DAY   Final    Report Status PENDING   Incomplete   CULTURE, BLOOD (ROUTINE X 2)     Status: Normal (Preliminary result)   Collection Time   08/20/11 11:55 AM      Component Value Range Status Comment   Specimen Description BLOOD RIGHT ARM   Final    Special Requests BOTTLES DRAWN AEROBIC AND ANAEROBIC 6CC   Final    Culture NO GROWTH 1 DAY   Final    Report  Status PENDING   Incomplete   MRSA PCR SCREENING     Status: Normal   Collection Time   08/20/11  3:02 PM      Component Value Range Status Comment   MRSA by PCR NEGATIVE  NEGATIVE  Final       Lab Results  Component Value Date   PTH 180.7* 10/19/2008   CALCIUM 8.8 08/21/2011   PHOS 3.7 08/21/2011               Impression: 1)Renal ESRD on HD             On TTS schedule            Was last Dialyzed yesterday             NON complaint with tx regimen             Pt has   Missed past 3 -4 tx before yesterday              Pt had Left AVG for access.    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  Calcium at goal.  5)ID Pneumonia  And Chronic wound         On ABX    6)FEN  Normokalemic  Hyponatremic secondary to ESRD - Now better Hypervolemic-   7)Acid base  Co2 at goal better     Plan:  Agree with Current tx and plan No need of HD today Will dialyze in am Will follow Bmet/ CBC Pt very Non complaint wit treatment regimen.     [name] 08/21/2011, 12:57 PM

## 2011-08-22 ENCOUNTER — Inpatient Hospital Stay (HOSPITAL_COMMUNITY): Payer: Medicare Other

## 2011-08-22 LAB — CBC
HCT: 31.3 % — ABNORMAL LOW (ref 36.0–46.0)
Hemoglobin: 10.2 g/dL — ABNORMAL LOW (ref 12.0–15.0)
MCH: 34.1 pg — ABNORMAL HIGH (ref 26.0–34.0)
MCHC: 32.6 g/dL (ref 30.0–36.0)
MCV: 104.7 fL — ABNORMAL HIGH (ref 78.0–100.0)
Platelets: 320 10*3/uL (ref 150–400)
RBC: 2.99 MIL/uL — ABNORMAL LOW (ref 3.87–5.11)
RDW: 15.4 % (ref 11.5–15.5)
WBC: 8.5 10*3/uL (ref 4.0–10.5)

## 2011-08-22 LAB — PTH, INTACT AND CALCIUM
Calcium, Total (PTH): 8.3 mg/dL — ABNORMAL LOW (ref 8.4–10.5)
PTH: 260.2 pg/mL — ABNORMAL HIGH (ref 14.0–72.0)

## 2011-08-22 LAB — BASIC METABOLIC PANEL
BUN: 41 mg/dL — ABNORMAL HIGH (ref 6–23)
CO2: 25 mEq/L (ref 19–32)
Calcium: 8.9 mg/dL (ref 8.4–10.5)
Chloride: 99 mEq/L (ref 96–112)
Creatinine, Ser: 4.19 mg/dL — ABNORMAL HIGH (ref 0.50–1.10)
GFR calc Af Amer: 14 mL/min — ABNORMAL LOW (ref >90)
GFR calc non Af Amer: 12 mL/min — ABNORMAL LOW (ref >90)
Glucose, Bld: 137 mg/dL — ABNORMAL HIGH (ref 70–99)
Potassium: 3.9 mEq/L (ref 3.5–5.1)
Sodium: 139 mEq/L (ref 135–145)

## 2011-08-22 MED ORDER — METOPROLOL TARTRATE 25 MG PO TABS
25.0000 mg | ORAL_TABLET | Freq: Two times a day (BID) | ORAL | Status: DC
  Administered 2011-08-22 – 2011-08-23 (×2): 25 mg via ORAL
  Filled 2011-08-22 (×3): qty 1

## 2011-08-22 MED ORDER — METOCLOPRAMIDE HCL 5 MG/ML IJ SOLN
10.0000 mg | Freq: Four times a day (QID) | INTRAMUSCULAR | Status: DC
  Administered 2011-08-23: 10 mg via INTRAVENOUS
  Filled 2011-08-22: qty 2

## 2011-08-22 MED ORDER — ONDANSETRON HCL 4 MG/2ML IJ SOLN
4.0000 mg | Freq: Four times a day (QID) | INTRAMUSCULAR | Status: DC | PRN
  Administered 2011-08-22 – 2011-08-23 (×4): 4 mg via INTRAVENOUS
  Filled 2011-08-22 (×4): qty 2

## 2011-08-22 MED ORDER — INSULIN ASPART 100 UNIT/ML ~~LOC~~ SOLN: 0.0000 [IU] | Freq: Every day | SUBCUTANEOUS | Status: DC

## 2011-08-22 MED ORDER — PROMETHAZINE HCL 25 MG/ML IJ SOLN
25.0000 mg | Freq: Four times a day (QID) | INTRAMUSCULAR | Status: DC | PRN
  Administered 2011-08-22 – 2011-08-23 (×2): 25 mg via INTRAVENOUS
  Filled 2011-08-22 (×2): qty 1

## 2011-08-22 MED ORDER — INSULIN ASPART 100 UNIT/ML ~~LOC~~ SOLN
0.0000 [IU] | Freq: Three times a day (TID) | SUBCUTANEOUS | Status: DC
  Administered 2011-08-22: 5 [IU] via SUBCUTANEOUS
  Administered 2011-08-23: 3 [IU] via SUBCUTANEOUS

## 2011-08-22 MED ORDER — HYDROMORPHONE HCL PF 1 MG/ML IJ SOLN
1.0000 mg | Freq: Three times a day (TID) | INTRAMUSCULAR | Status: DC | PRN
  Administered 2011-08-22 – 2011-08-23 (×3): 1 mg via INTRAVENOUS
  Filled 2011-08-22 (×3): qty 1

## 2011-08-22 MED ORDER — PANTOPRAZOLE SODIUM 40 MG IV SOLR
40.0000 mg | Freq: Once | INTRAVENOUS | Status: AC
  Administered 2011-08-22: 40 mg via INTRAVENOUS
  Filled 2011-08-22: qty 40

## 2011-08-22 MED ORDER — COLLAGENASE 250 UNIT/GM EX OINT
TOPICAL_OINTMENT | Freq: Every day | CUTANEOUS | Status: DC
  Administered 2011-08-22 – 2011-08-23 (×2): via TOPICAL
  Filled 2011-08-22: qty 30

## 2011-08-22 NOTE — Progress Notes (Addendum)
Subjective: This lady feels somewhat better today except for some mild abdominal pain and also headache. She has not had any solid food. She is currently having dialysis. She is known to be noncompliant with dialysis.           Physical Exam: Blood pressure 184/92, pulse 94, temperature 98.8 F (37.1 C), temperature source Oral, resp. rate 16, height 5' 10.5" (1.791 m), weight 80.7 kg (177 lb 14.6 oz), SpO2 94.00%. She does look systemically well. Is not toxic or septic. She is alert and orientated. Heart sounds are present and normal. Lung fields are clear, anteriorly.   Investigations:  Recent Results (from the past 240 hour(s))  CULTURE, BLOOD (ROUTINE X 2)     Status: Normal (Preliminary result)   Collection Time   08/20/11 10:10 AM      Component Value Range Status Comment   Specimen Description BLOOD RIGHT ARM   Final    Special Requests BOTTLES DRAWN AEROBIC AND ANAEROBIC 6CC   Final    Culture NO GROWTH 1 DAY   Final    Report Status PENDING   Incomplete   CULTURE, BLOOD (ROUTINE X 2)     Status: Normal (Preliminary result)   Collection Time   08/20/11 11:55 AM      Component Value Range Status Comment   Specimen Description BLOOD RIGHT ARM   Final    Special Requests BOTTLES DRAWN AEROBIC AND ANAEROBIC 6CC   Final    Culture NO GROWTH 1 DAY   Final    Report Status PENDING   Incomplete   MRSA PCR SCREENING     Status: Normal   Collection Time   08/20/11  3:02 PM      Component Value Range Status Comment   MRSA by PCR NEGATIVE  NEGATIVE  Final      Basic Metabolic Panel:  [facility] 08/22/11 0601 08/21/11 0533  NA 139 136  K 3.9 3.7  CL 99 96  CO2 25 30  GLUCOSE 137* 88  BUN 41* 34*  CREATININE 4.19* 3.45*  CALCIUM 8.9 8.3*8.8  MG -- --  PHOS -- 3.7   Liver Function Tests:  Basename 08/21/11 0533 08/20/11 1010  AST 14 8  ALT 13 11  ALKPHOS 212* 227*  BILITOT 0.1* 0.1*  PROT 6.5 6.9  ALBUMIN 2.5* 2.6*     CBC:  Basename 08/22/11 0601  08/21/11 0533  WBC 8.5 9.6  NEUTROABS -- --  HGB 10.2* 9.9*  HCT 31.3* 29.9*  MCV 104.7* 102.7*  PLT 320 329    Dg Abd 2 Views  08/20/2011  *RADIOLOGY REPORT*  Clinical Data: Abdominal pain.  Nausea and vomiting.  Renal failure.  ABDOMEN - 2 VIEW  Comparison: 12/28/2010  Findings: Several gas filled nondilated small bowel loops are seen in the left abdomen containing air fluid levels.  Scattered colonic gas is seen without evidence of dilatation.  There is no evidence of free intraperitoneal air.  Small right pleural effusion is noted.  Surgical clips are seen from prior cholecystectomy and posterior lumbar spine fusion hardware is noted.  Peripheral vascular calcification is seen involve the iliac arteries.  Pelvic phleboliths noted, but no definite radiopaque calculi identified.  IMPRESSION:  Nonspecific, nonobstructive bowel gas pattern.  Original Report Authenticated By: Danae Orleans, M.D.        Impression: 1. Right lower lobe pneumonia. 2. End-stage renal disease on hemodialysis 3 times a week although noncompliant with this. 3. Right leg cellulitis with chronic wound,  seen by wound care consult. Wound care specialist has recommended topical care for the time being. 4. Hypertension, uncontrolled.  5. Diabetes mellitus, currently reasonable control.     Plan: 1. Hemodialysis today. 2. Start diabetic diet. 3. Add metoprolol 25 mg twice a day 2 antihypertensive regimen. 4. Possible discharge home tomorrow, if blood pressure reasonable.     LOS: 2 days   Wilson Singer Pager [phone]  08/22/2011, 11:31 AM

## 2011-08-22 NOTE — Progress Notes (Signed)
BOSTYN KUNKLER  MRN: [id number]  DOB/AGE: 1971-07-17 40 y.o.  Primary Care Physician:BLUTH,KIRK, MD  Admit date: 08/20/2011  Chief Complaint:  Chief Complaint  Patient presents with  . Nausea  . Emesis  . Headache  . Sore Throat  . Abdominal Pain  . Chest Pain    S-Pt presented on  08/20/2011 with  Chief Complaint  Patient presents with  . Nausea  . Emesis  . Headache  . Sore Throat  . Abdominal Pain  . Chest Pain  .    Pt today feels better.Pt is happy that she might be d/ced in am .   Meds     . amLODipine  10 mg Oral Daily  . ARIPiprazole  2 mg Oral Daily  . atropine  1 drop Right Eye QID  . brimonidine  1 drop Right Eye BID  . brinzolamide  1 drop Right Eye TID  . cinacalcet  30 mg Oral Daily  . cloNIDine  0.2 mg Oral BID  . clopidogrel  75 mg Oral Daily  . collagenase   Topical Daily  . enoxaparin  30 mg Subcutaneous Daily  . epoetin alfa  4,000 Units Intravenous 3 times weekly  . homatropine  1 drop Right Eye TID  . hydrochlorothiazide  12.5 mg Oral Daily  . imipenem-cilastatin  250 mg Intravenous Q12H  . insulin aspart  0-15 Units Subcutaneous TID WC  . insulin aspart  0-5 Units Subcutaneous QHS  . insulin glargine  20 Units Subcutaneous QHS  . lisinopril  40 mg Oral Daily  . loteprednol  1 drop Right Eye QID  . metoCLOPramide (REGLAN) injection  10 mg Intravenous Q8H  . metoprolol tartrate  25 mg Oral BID  . multivitamin  1 tablet Oral Daily  . olmesartan  40 mg Oral Daily  . PARoxetine  40 mg Oral Q0700  . prednisoLONE acetate  1 drop Right Eye BID  . sevelamer  1,600 mg Oral TID WC  . sodium bicarbonate  650 mg Oral BID  . sucralfate  1 g Oral BID AC  . timolol  1 drop Right Eye BID  . torsemide  100 mg Oral Daily  . travoprost (benzalkonium)  1 drop Right Eye QHS  . traZODone  200 mg Oral QHS  . vancomycin  1,000 mg Intravenous Q M,W,F-HD  . DISCONTD: insulin aspart  1-14 Units Subcutaneous TID WC         JYN:WGNFA from the  symptoms mentioned above,there are no other symptoms referable to all systems reviewed.  Physical Exam: Vital signs in last 24 hours: Temp:  [97.9 F (36.6 C)-99.2 F (37.3 C)] 98.7 F (37.1 C) (12/26 1355) Pulse Rate:  [90-120] 120  (12/26 1355) Resp:  [16-20] 20  (12/26 1355) BP: (117-189)/(57-93) 117/73 mmHg (12/26 1355) SpO2:  [92 %-95 %] 95 % (12/26 1355) Weight:  [168 lb 10.4 oz (76.5 kg)-177 lb 14.6 oz (80.7 kg)] 168 lb 10.4 oz (76.5 kg) (12/26 1355) Weight change:  Last BM Date: 08/20/11  Intake/Output from previous day: 12/25 0701 - 12/26 0700 In: 220 [I.V.:120; IV Piggyback:100] Out: -  Total I/O In: -  Out: 4100 [Urine:100; Other:4000]   Physical Exam: General- pt is awake,alert, oriented to time place and person Resp- No acute REsp distress, CTA B/L anteriorly CVS- S1S2 regular in rate and rhythm GIT- BS+, soft, NT, ND EXT-  LE Edema 1+ Much better than before,   Lab Results: CBC  Basename 08/22/11 0601 08/21/11 0533  WBC  8.5 9.6  HGB 10.2* 9.9*  HCT 31.3* 29.9*  PLT 320 329    BMET  Basename 08/22/11 0601 08/21/11 0533  NA 139 136  K 3.9 3.7  CL 99 96  CO2 25 30  GLUCOSE 137* 88  BUN 41* 34*  CREATININE 4.19* 3.45*  CALCIUM 8.9 8.3*8.8    MICRO Recent Results (from the past 240 hour(s))  CULTURE, BLOOD (ROUTINE X 2)     Status: Normal (Preliminary result)   Collection Time   08/20/11 10:10 AM      Component Value Range Status Comment   Specimen Description BLOOD RIGHT ARM   Final    Special Requests BOTTLES DRAWN AEROBIC AND ANAEROBIC 6CC   Final    Culture NO GROWTH 2 DAYS   Final    Report Status PENDING   Incomplete   CULTURE, BLOOD (ROUTINE X 2)     Status: Normal (Preliminary result)   Collection Time   08/20/11 11:55 AM      Component Value Range Status Comment   Specimen Description BLOOD RIGHT ARM   Final    Special Requests BOTTLES DRAWN AEROBIC AND ANAEROBIC 6CC   Final    Culture NO GROWTH 2 DAYS   Final    Report  Status PENDING   Incomplete   MRSA PCR SCREENING     Status: Normal   Collection Time   08/20/11  3:02 PM      Component Value Range Status Comment   MRSA by PCR NEGATIVE  NEGATIVE  Final       Lab Results  Component Value Date   PTH 260.2* 08/21/2011   CALCIUM 8.9 08/22/2011   PHOS 3.7 08/21/2011               Impression: 1)Renal  ESRD on HD                 On TTS schedule                 Was last Dialyzed MOnday                 NON complaint with tx regimen                 Pt has Missed past 3 -4 tx before admission               Pt had Left AVG for access.     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                         Calcium at goal.    5)ID       Pneumonia And Chronic wound                On ABX    6)FEN  Normokalemic  Hyponatremic secondary to ESRD - Now better  Hypervolemic- much better   7)Acid base  Co2 at goal better     Plan:   Pt dialyzed today Educated pt about importance of compliance with tx regimen. Will suggest to d/c HCTZ as ESRD to probably  Not effective       BHUTANI,MANPREET S 08/22/2011, 2:28 PM

## 2011-08-22 NOTE — Consult Note (Signed)
WOC consult Note Reason for Consult: req. To eval pt for topical wound care for right lateral malleolar ulceration.  Pt with bilateral LE edema L>R and with ulceration noted to right lateral malleolar region.  Hemosiderin staining of bil. LE consistent with venous changes.  Doppler studies ordered but not performed yet.  With hx would not consider compression wraps until ABIs obtained to verify pts ability to tolerate this tx.  Will only recommend topical care for this area and if ABI ordered and normal could benefit from Unna's boot applied per ortho tech and changed 1x wk.   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  If ABI obtained and normal would suggest Unna's boot to be applied and changed weekly.  Re consult if needed, will not follow at this time. Thanks  Clint Strupp [employer], CWOCN [phone])

## 2011-08-23 LAB — GLUCOSE, CAPILLARY: Glucose-Capillary: 167 mg/dL — ABNORMAL HIGH (ref 70–99)

## 2011-08-23 MED ORDER — ALPRAZOLAM 1 MG PO TABS: 1.0000 mg | ORAL_TABLET | Freq: Four times a day (QID) | ORAL | Status: DC | PRN

## 2011-08-23 MED ORDER — HEPARIN SOD (PORK) LOCK FLUSH 100 UNIT/ML IV SOLN
500.0000 [IU] | INTRAVENOUS | Status: AC | PRN
  Administered 2011-08-23: 500 [IU]
  Filled 2011-08-23: qty 5

## 2011-08-23 MED ORDER — OLMESARTAN MEDOXOMIL 40 MG PO TABS: 40.0000 mg | ORAL_TABLET | Freq: Every day | ORAL | Status: DC

## 2011-08-23 MED ORDER — HYDROMORPHONE HCL PF 1 MG/ML IJ SOLN
1.0000 mg | Freq: Once | INTRAMUSCULAR | Status: AC
  Administered 2011-08-23: 1 mg via INTRAVENOUS
  Filled 2011-08-23: qty 1

## 2011-08-23 MED ORDER — MOXIFLOXACIN HCL 400 MG PO TABS: 400.0000 mg | ORAL_TABLET | Freq: Every day | ORAL | Status: AC

## 2011-08-23 MED ORDER — INSULIN GLARGINE 100 UNIT/ML ~~LOC~~ SOLN: 25.0000 [IU] | Freq: Every day | SUBCUTANEOUS | Status: DC

## 2011-08-23 MED ORDER — ALPRAZOLAM 0.5 MG PO TABS
0.5000 mg | ORAL_TABLET | Freq: Three times a day (TID) | ORAL | Status: DC | PRN
  Administered 2011-08-23 (×2): 1 mg via ORAL
  Filled 2011-08-23: qty 2
  Filled 2011-08-23: qty 1

## 2011-08-23 MED ORDER — METOPROLOL TARTRATE 25 MG PO TABS: 25.0000 mg | ORAL_TABLET | Freq: Two times a day (BID) | ORAL | Status: DC

## 2011-08-23 NOTE — Progress Notes (Signed)
Utilization review completed.  

## 2011-08-23 NOTE — Progress Notes (Signed)
Pt discharged home in stable condition.  Port flushed with heparin and deaccessed.  Pt instructed to make follow up appt with PCP and instructed on new meds and changes to meds, and discharge instructions  Pt verbalizes understanding.  Wound dressing changed and dry and intact.  Pt has no complaints at this time.

## 2011-08-23 NOTE — Discharge Summary (Signed)
Physician Discharge Summary  Patient ID: [name] MRN: [id number] DOB/AGE: [birth year] 40 y.o. Primary Care Physician:BLUTH,KIRK, MD Admit date: 08/20/2011 Discharge date: 08/23/2011    Discharge Diagnoses:  1. Right lower lobe pneumonia, clinically improving. 2. End-stage renal disease on hemodialysis 3 times a week with noncompliance. 3. Right leg cellulitis with chronic wound, improving. 4. Hypertension, adjustments in medications made. 5. Diabetes mellitus.   Current Discharge Medication List    START taking these medications   Details  metoprolol tartrate (LOPRESSOR) 25 MG tablet Take 1 tablet (25 mg total) by mouth 2 (two) times daily. Qty: 60 tablet, Refills: 0    moxifloxacin (AVELOX) 400 MG tablet Take 1 tablet (400 mg total) by mouth daily. Qty: 7 tablet, Refills: 0    olmesartan (BENICAR) 40 MG tablet Take 1 tablet (40 mg total) by mouth daily. Qty: 30 tablet, Refills: 0      CONTINUE these medications which have CHANGED   Details  ALPRAZolam (XANAX) 1 MG tablet Take 1 tablet (1 mg total) by mouth 4 (four) times daily as needed for anxiety. For anxiety  Qty: 30 tablet, Refills: 0    insulin glargine (LANTUS) 100 UNIT/ML injection Inject 25 Units into the skin at bedtime. Qty: 10 mL, Refills: 0      CONTINUE these medications which have NOT CHANGED   Details  albuterol (PROVENTIL HFA;VENTOLIN HFA) 108 (90 BASE) MCG/ACT inhaler Inhale 2 puffs into the lungs every 6 (six) hours as needed. Shortness of breath      amLODipine (NORVASC) 10 MG tablet Take 10 mg by mouth daily.      ARIPiprazole (ABILIFY) 2 MG tablet Take 2 mg by mouth daily.      atropine 1 % ophthalmic solution Place 1 drop into the right eye 4 (four) times daily.      b complex-vitamin c-folic acid (NEPHRO-VITE) 0.8 MG TABS Take 0.8 mg by mouth at bedtime.      brimonidine (ALPHAGAN) 0.15 % ophthalmic solution Place 1 drop into the right eye 2 (two) times daily.      brinzolamide  (AZOPT) 1 % ophthalmic suspension Place 1 drop into the right eye 3 (three) times daily.      butalbital-acetaminophen-caffeine (FIORICET, ESGIC) 50-325-40 MG per tablet Take 1 tablet by mouth every 6 (six) hours as needed. For pain    cinacalcet (SENSIPAR) 30 MG tablet Take 30 mg by mouth daily.      cloNIDine (CATAPRES) 0.2 MG tablet Take 0.2 mg by mouth 2 (two) times daily.      clopidogrel (PLAVIX) 75 MG tablet Take 75 mg by mouth daily.      homatropine 5 % ophthalmic solution Place 1 drop into the right eye 3 (three) times daily.      insulin lispro (HUMALOG) 100 UNIT/ML injection Inject 7-15 Units into the skin 4 (four) times daily -  before meals and at bedtime. TAKING ON SLIDING SCALE 200-249=7 units 250-299=10 units 300-349=12 units >350=15 units    lisinopril (PRINIVIL,ZESTRIL) 40 MG tablet Take 40 mg by mouth daily.      loteprednol (LOTEMAX) 0.2 % SUSP Place 1 drop into the right eye 4 (four) times daily.      moxifloxacin (VIGAMOX) 0.5 % ophthalmic solution Place 1 drop into the right eye 3 (three) times daily.      omeprazole (PRILOSEC) 40 MG capsule Take 1 capsule (40 mg total) by mouth daily before breakfast. Take (1) Capsule Twice Daily 30 Minutes Before Breakfast and 30 Minutes  Before Supper Qty: 30 capsule, Refills: 5   Associated Diagnoses: Gastroparesis; GERD (gastroesophageal reflux disease)    oxyCODONE-acetaminophen (PERCOCET) 10-325 MG per tablet Take 1 tablet by mouth every 4 (four) hours as needed. For pain     PARoxetine (PAXIL) 40 MG tablet Take 40 mg by mouth every morning.     prednisoLONE acetate (PRED FORTE) 1 % ophthalmic suspension Place 1 drop into the right eye 2 (two) times daily.      promethazine (PHENERGAN) 25 MG tablet Take 25 mg by mouth every 6 (six) hours as needed. nausea    sevelamer (RENVELA) 800 MG tablet Take 4,000 mg by mouth 3 (three) times daily with meals.     sodium bicarbonate 650 MG tablet Take 650 mg by mouth 2 (two)  times daily.      sucralfate (CARAFATE) 1 G tablet Take 1 g by mouth 2 (two) times daily before a meal.     timolol (BETIMOL) 0.5 % ophthalmic solution Place 1 drop into the right eye 2 (two) times daily.      torsemide (DEMADEX) 100 MG tablet Take 100 mg by mouth daily.      travoprost, benzalkonium, (TRAVATAN) 0.004 % ophthalmic solution Place 1 drop into the right eye at bedtime.      traZODone (DESYREL) 100 MG tablet Take 200 mg by mouth at bedtime.     metoCLOPramide (REGLAN) 10 MG tablet Take 10 mg by mouth. Two 10 mg tablets four times daily      STOP taking these medications     doxycycline (DORYX) 100 MG DR capsule      valsartan-hydrochlorothiazide (DIOVAN-HCT) 320-12.5 MG per tablet         Discharged Condition: Improved and stable.    Consults: Nephrology, Dr Fausto Skillern.  Significant Diagnostic Studies: US Abdomen Complete  08/22/2011  *RADIOLOGY REPORT*  Clinical Data:  Abdominal pain.  Nausea.  Post cholecystectomy.  COMPLETE ABDOMINAL ULTRASOUND  Comparison:  Renal sonogram 07/05/2009.  CT abdomen pelvis 03/28/2009.  Findings:  Gallbladder:  Post cholecystectomy.  Common bile duct:  10.6 mm proximally.  The mid to distal aspect is not visualized secondary to overlying bowel gas.  Liver:  Increased echogenicity which may represent fatty infiltration.  Evaluation limited without obvious mass.  IVC:  Appears normal.  Pancreas:  Evaluation limited by bowel gas.  The portions which are visualized appear unremarkable.  Spleen:  8.4 cm.  No focal mass.  Right Kidney:  11.6 cm.  No hydronephrosis.  Increased echogenicity which may represent result of medical renal disease.  Mass adjacent to the right kidney may represent adrenal enlargement although renal mass not excluded.  Left Kidney:  10.5 cm.  No hydronephrosis.  Increased echogenicity renal parenchyma may represent result of medical renal disease type changes.  No left renal mass identified.  The enlarged left adrenal  gland noted on prior CT is not appreciated/evaluated on the present exam.  Abdominal aorta:  Not visualized secondary to overlying bowel gas.  Right pleural effusion.  IMPRESSION: Post cholecystectomy.  Prominent common bile duct measuring up to 10.6 cm proximally.  Mid to distal aspect not visualized.  Secondary to bowel gas, limit evaluation of portions of the pancreas and abdominal aorta.  Increased renal echogenicity which may represent result of medical renal disease type changes.  No hydronephrosis.  Question right adrenal versus right renal mass.  Right-sided pleural effusion.  Suggestion of fatty infiltration of the liver.  Original Report Authenticated By: Fuller Canada, M.D.  Dg Chest Portable 1 View  08/20/2011  *RADIOLOGY REPORT*  Clinical Data: Shortness of breath.  PORTABLE CHEST - 1 VIEW  Comparison: 05/05/2011  Findings: Consolidation noted in the right lower lobe compatible with pneumonia.  Possible small right effusion.  Heart is mildly enlarged.  Right Port-A-Cath remains in place, unchanged.  Left lung is clear.  No acute bony abnormality.  IMPRESSION: Right lower lobe consolidation concerning for pneumonia.  Suspect small right effusion.  Original Report Authenticated By: Cyndie Chime, M.D.   Dg Abd 2 Views  08/20/2011  *RADIOLOGY REPORT*  Clinical Data: Abdominal pain.  Nausea and vomiting.  Renal failure.  ABDOMEN - 2 VIEW  Comparison: 12/28/2010  Findings: Several gas filled nondilated small bowel loops are seen in the left abdomen containing air fluid levels.  Scattered colonic gas is seen without evidence of dilatation.  There is no evidence of free intraperitoneal air.  Small right pleural effusion is noted.  Surgical clips are seen from prior cholecystectomy and posterior lumbar spine fusion hardware is noted.  Peripheral vascular calcification is seen involve the iliac arteries.  Pelvic phleboliths noted, but no definite radiopaque calculi identified.  IMPRESSION:   Nonspecific, nonobstructive bowel gas pattern.  Original Report Authenticated By: Danae Orleans, M.D.    Lab Results: Basic Metabolic Panel:  Basename 08/22/11 0601 08/21/11 0533  NA 139 136  K 3.9 3.7  CL 99 96  CO2 25 30  GLUCOSE 137* 88  BUN 41* 34*  CREATININE 4.19* 3.45*  CALCIUM 8.9 8.3*8.8  MG -- --  PHOS -- 3.7   Liver Function Tests:  Basename 08/21/11 0533 08/20/11 1010  AST 14 8  ALT 13 11  ALKPHOS 212* 227*  BILITOT 0.1* 0.1*  PROT 6.5 6.9  ALBUMIN 2.5* 2.6*     CBC:  Basename 08/22/11 0601 08/21/11 0533  WBC 8.5 9.6  NEUTROABS -- --  HGB 10.2* 9.9*  HCT 31.3* 29.9*  MCV 104.7* 102.7*  PLT 320 329    Recent Results (from the past 240 hour(s))  CULTURE, BLOOD (ROUTINE X 2)     Status: Normal (Preliminary result)   Collection Time   08/20/11 10:10 AM      Component Value Range Status Comment   Specimen Description BLOOD RIGHT ARM   Final    Special Requests BOTTLES DRAWN AEROBIC AND ANAEROBIC 6CC   Final    Culture NO GROWTH 2 DAYS   Final    Report Status PENDING   Incomplete   CULTURE, BLOOD (ROUTINE X 2)     Status: Normal (Preliminary result)   Collection Time   08/20/11 11:55 AM      Component Value Range Status Comment   Specimen Description BLOOD RIGHT ARM   Final    Special Requests BOTTLES DRAWN AEROBIC AND ANAEROBIC 6CC   Final    Culture NO GROWTH 2 DAYS   Final    Report Status PENDING   Incomplete   MRSA PCR SCREENING     Status: Normal   Collection Time   08/20/11  3:02 PM      Component Value Range Status Comment   MRSA by PCR NEGATIVE  NEGATIVE  Final      Hospital Course: This 40 year old lady, who is well-known to the hospital service, presented to the hospital with symptoms of nausea and vomiting for 5 days. Please see initial history and physical examination. She does have a history of diabetic gastroparesis and it was felt that this may well  have been a contributing factor in her symptoms. Also, she was found to have a  right lower lobe pneumonia although she did not initially present with any symptoms of such. In any case, she was treated appropriately with antibiotics. She did have dialysis while she was in the hospital which improved her numbers and she began to feel better. This lady has frequently missed dialysis appointments and she has been strongly encouraged not to do so in the future if she continues to wish to have dialysis. Influenza test was negative. Ultrasound of the abdomen was also done in view of her abdominal pain which was likely to be due to gastroparesis. The ultrasound showed the absence of the gallbladder and a previous history of cholecystectomy. Common bile duct was 10.6 mm proximally.  Discharge Exam: Blood pressure 154/73, pulse 86, temperature 98.2 F (36.8 C), temperature source Oral, resp. rate 20, height 5' 10.5" (1.791 m), weight 76.5 kg (168 lb 10.4 oz), SpO2 92.00%. She looks systemically well today. Heart sounds are present and normal. Lung fields show reduced air entry in the right mid and lower zones. Abdomen is soft and nontender. She is alert and orientated.  Disposition: Home. She has been strongly encouraged to keep appointments for her dialysis. She is also due to see a pain specialist today.  Discharge Orders    Future Orders Please Complete By Expires   Diet - low sodium heart healthy      Increase activity slowly         Follow-up Information    Follow up with BLUTH,KIRK .         SignedWilson Singer Pager [phone]  08/23/2011, 9:00 AM

## 2011-08-25 LAB — CULTURE, BLOOD (ROUTINE X 2): Culture  Setup Time: 201212280239

## 2011-09-05 ENCOUNTER — Other Ambulatory Visit (INDEPENDENT_AMBULATORY_CARE_PROVIDER_SITE_OTHER): Payer: Self-pay | Admitting: Internal Medicine

## 2011-09-18 ENCOUNTER — Other Ambulatory Visit: Payer: Self-pay | Admitting: Medical

## 2011-09-20 ENCOUNTER — Other Ambulatory Visit: Payer: Self-pay | Admitting: Medical

## 2011-09-24 ENCOUNTER — Other Ambulatory Visit: Payer: Self-pay | Admitting: Medical

## 2011-10-03 ENCOUNTER — Other Ambulatory Visit (INDEPENDENT_AMBULATORY_CARE_PROVIDER_SITE_OTHER): Payer: Self-pay | Admitting: Internal Medicine

## 2011-10-05 IMAGING — CR DG ABDOMEN ACUTE W/ 1V CHEST
3 series · 3 of 3 positions shown · non-contrast
Comparison: 06/09/2010

CLINICAL DATA: Abdominal pain, vomiting

ACUTE ABDOMEN SERIES (ABDOMEN 2 VIEW & CHEST 1 VIEW)

[view not recorded (1 of 3)]
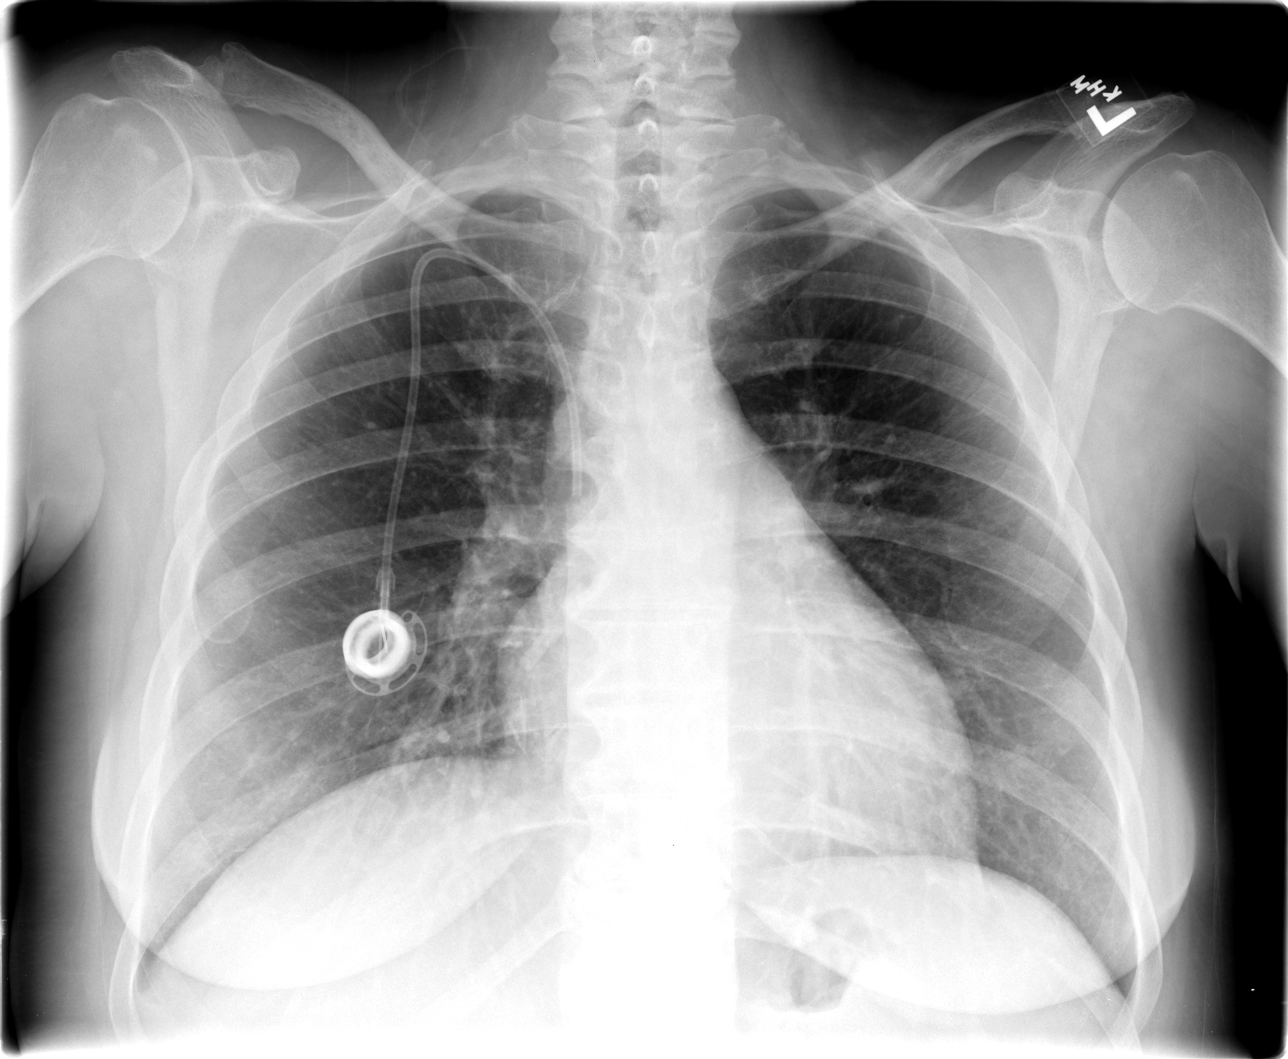

[view not recorded (2 of 3)]
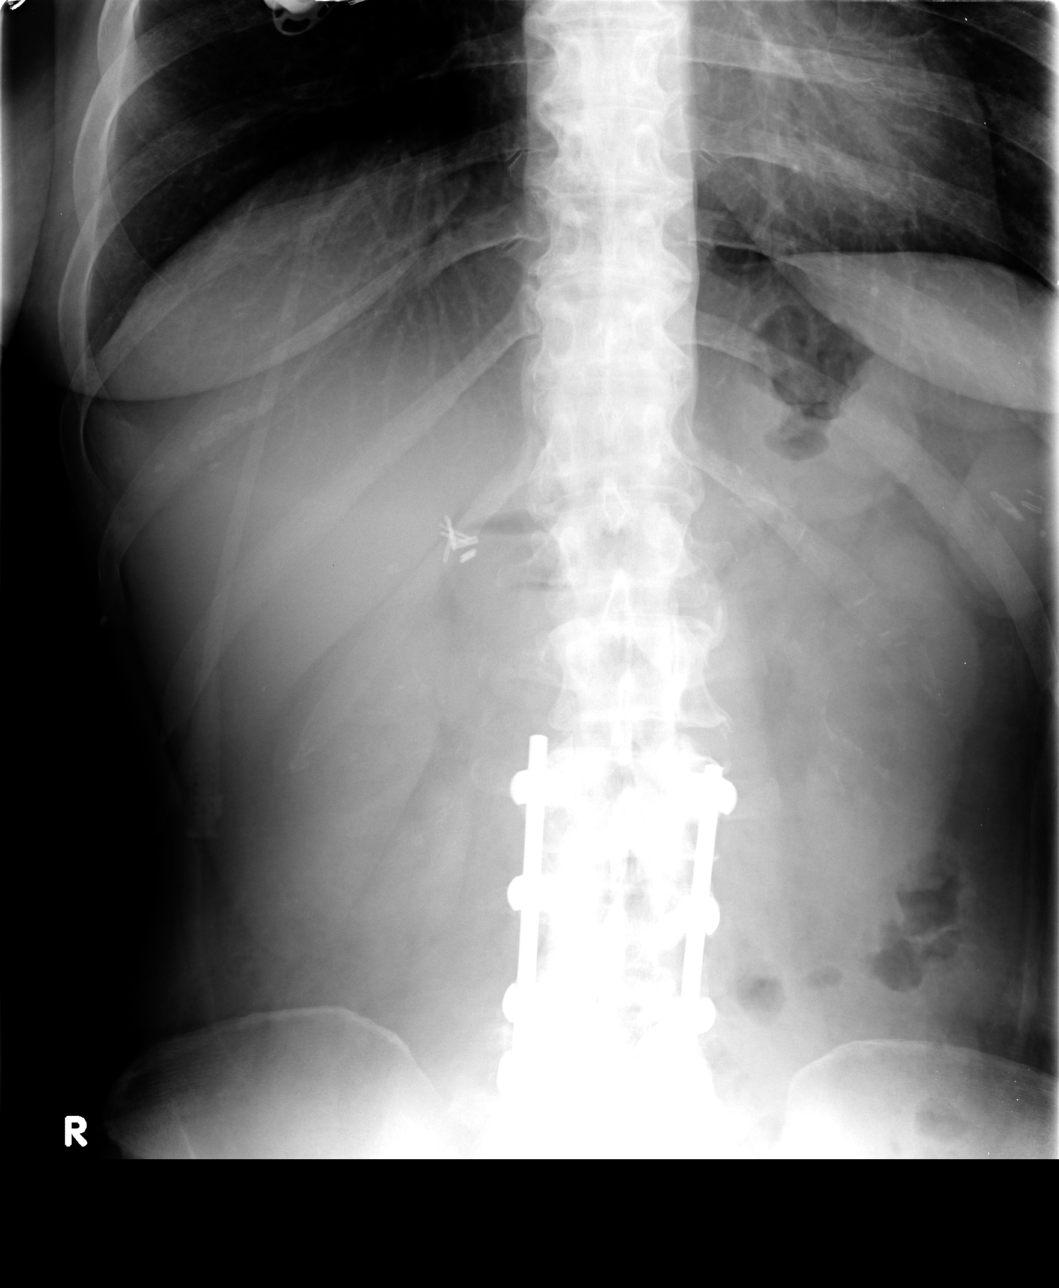

[view not recorded (3 of 3)]
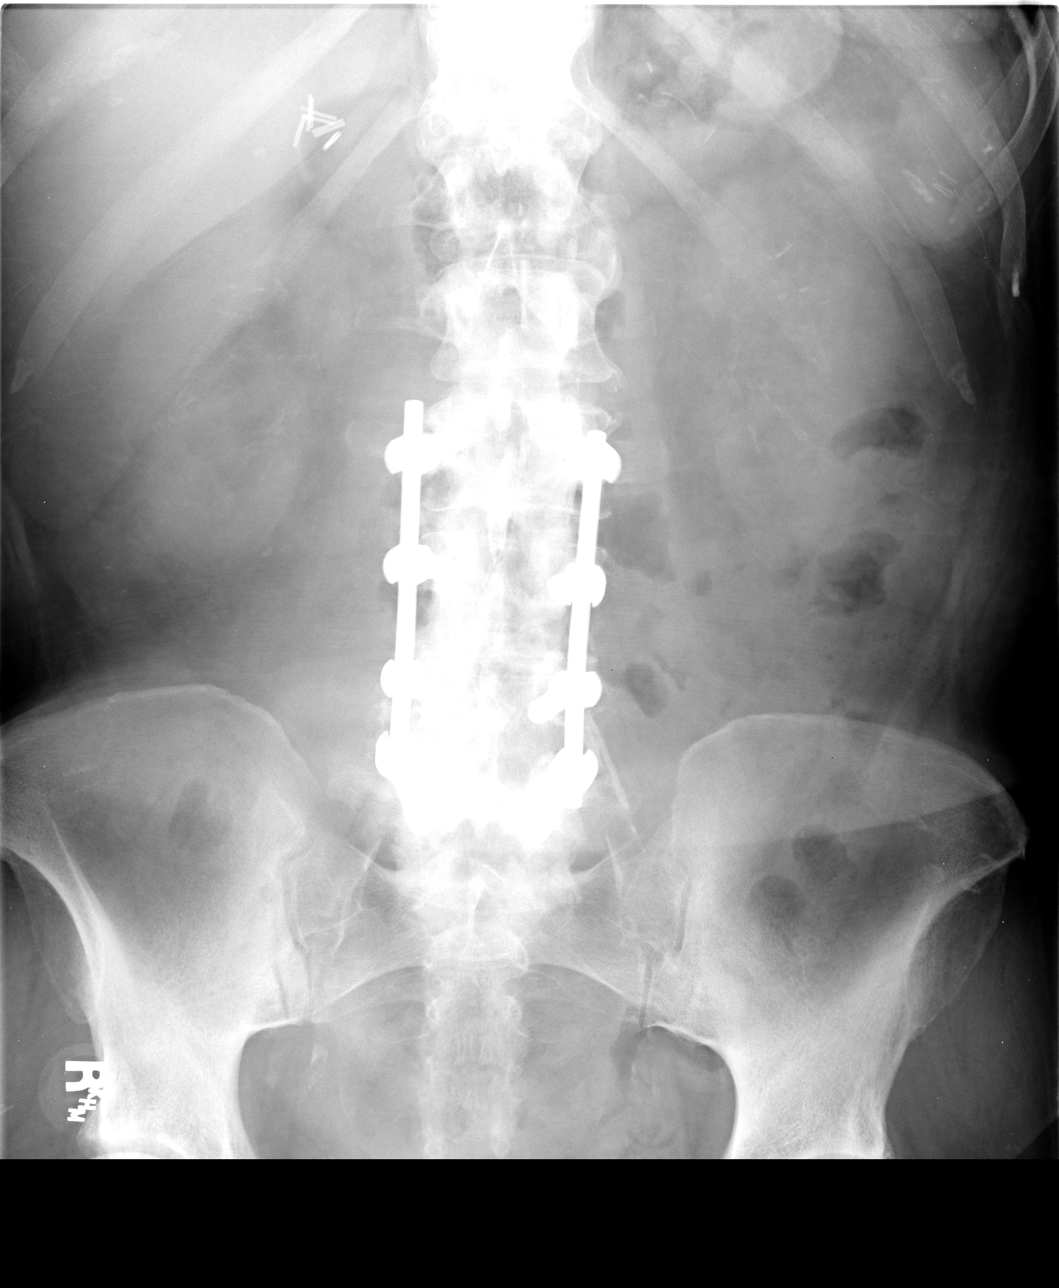

[3 of 3 positions shown; findings below may reference images not displayed]

FINDINGS: The lungs are clear.  The heart is normal in size.  Right
chest wall Port-A-Cath is in place.  There is no intra-abdominal
free air identified.  No dilated small bowel loops are imaged.
Some gas is seen in the left colon.  Posterior lumbar fusion is
seen from L3-S1.  Vascular calcifications are noted in the abdomen.
Cholecystectomy clips are seen in the right upper quadrant.
IMPRESSION: No acute cardiopulmonary disease.  No evidence small bowel
obstruction.

## 2011-10-06 ENCOUNTER — Emergency Department (HOSPITAL_COMMUNITY)
Admission: EM | Admit: 2011-10-06 | Discharge: 2011-10-06 | Disposition: A | Discharge status: Home / Self Care | Payer: Medicare Other | Attending: Emergency Medicine | Admitting: Emergency Medicine

## 2011-10-06 ENCOUNTER — Encounter (HOSPITAL_COMMUNITY): Payer: Self-pay

## 2011-10-06 ENCOUNTER — Emergency Department (HOSPITAL_COMMUNITY): Payer: Medicare Other

## 2011-10-06 DIAGNOSIS — Z79899 Other long term (current) drug therapy: Secondary | ICD-10-CM | POA: Insufficient documentation

## 2011-10-06 DIAGNOSIS — F341 Dysthymic disorder: Secondary | ICD-10-CM | POA: Insufficient documentation

## 2011-10-06 DIAGNOSIS — I120 Hypertensive chronic kidney disease with stage 5 chronic kidney disease or end stage renal disease: Secondary | ICD-10-CM | POA: Insufficient documentation

## 2011-10-06 DIAGNOSIS — F319 Bipolar disorder, unspecified: Secondary | ICD-10-CM | POA: Insufficient documentation

## 2011-10-06 DIAGNOSIS — K3184 Gastroparesis: Secondary | ICD-10-CM

## 2011-10-06 DIAGNOSIS — E1149 Type 2 diabetes mellitus with other diabetic neurological complication: Secondary | ICD-10-CM | POA: Insufficient documentation

## 2011-10-06 DIAGNOSIS — L02419 Cutaneous abscess of limb, unspecified: Secondary | ICD-10-CM | POA: Insufficient documentation

## 2011-10-06 DIAGNOSIS — I2510 Atherosclerotic heart disease of native coronary artery without angina pectoris: Secondary | ICD-10-CM | POA: Insufficient documentation

## 2011-10-06 DIAGNOSIS — Z992 Dependence on renal dialysis: Secondary | ICD-10-CM | POA: Insufficient documentation

## 2011-10-06 DIAGNOSIS — R112 Nausea with vomiting, unspecified: Secondary | ICD-10-CM | POA: Insufficient documentation

## 2011-10-06 DIAGNOSIS — Z794 Long term (current) use of insulin: Secondary | ICD-10-CM | POA: Insufficient documentation

## 2011-10-06 DIAGNOSIS — L98499 Non-pressure chronic ulcer of skin of other sites with unspecified severity: Secondary | ICD-10-CM

## 2011-10-06 DIAGNOSIS — L97809 Non-pressure chronic ulcer of other part of unspecified lower leg with unspecified severity: Secondary | ICD-10-CM | POA: Insufficient documentation

## 2011-10-06 DIAGNOSIS — M79609 Pain in unspecified limb: Secondary | ICD-10-CM | POA: Insufficient documentation

## 2011-10-06 DIAGNOSIS — Z86718 Personal history of other venous thrombosis and embolism: Secondary | ICD-10-CM | POA: Insufficient documentation

## 2011-10-06 DIAGNOSIS — E785 Hyperlipidemia, unspecified: Secondary | ICD-10-CM | POA: Insufficient documentation

## 2011-10-06 DIAGNOSIS — L0390 Cellulitis, unspecified: Secondary | ICD-10-CM

## 2011-10-06 DIAGNOSIS — N186 End stage renal disease: Secondary | ICD-10-CM | POA: Insufficient documentation

## 2011-10-06 HISTORY — DX: Essential (primary) hypertension: I10

## 2011-10-06 LAB — DIFFERENTIAL
Basophils Absolute: 0 10*3/uL (ref 0.0–0.1)
Basophils Relative: 0 % (ref 0–1)
Eosinophils Absolute: 0 10*3/uL (ref 0.0–0.7)
Eosinophils Relative: 0 % (ref 0–5)
Lymphs Abs: 0.6 10*3/uL — ABNORMAL LOW (ref 0.7–4.0)
Neutrophils Relative %: 88 % — ABNORMAL HIGH (ref 43–77)

## 2011-10-06 LAB — BASIC METABOLIC PANEL
Calcium: 10.4 mg/dL (ref 8.4–10.5)
GFR calc Af Amer: 13 mL/min — ABNORMAL LOW (ref >90)
GFR calc non Af Amer: 11 mL/min — ABNORMAL LOW (ref >90)
Glucose, Bld: 239 mg/dL — ABNORMAL HIGH (ref 70–99)
Potassium: 4.1 mEq/L (ref 3.5–5.1)
Sodium: 134 mEq/L — ABNORMAL LOW (ref 135–145)

## 2011-10-06 LAB — CBC
MCH: 34.3 pg — ABNORMAL HIGH (ref 26.0–34.0)
MCHC: 33.7 g/dL (ref 30.0–36.0)
MCV: 102 fL — ABNORMAL HIGH (ref 78.0–100.0)
Platelets: 250 10*3/uL (ref 150–400)
RBC: 2.97 MIL/uL — ABNORMAL LOW (ref 3.87–5.11)
RDW: 16.1 % — ABNORMAL HIGH (ref 11.5–15.5)

## 2011-10-06 MED ORDER — ONDANSETRON HCL 4 MG/2ML IJ SOLN
4.0000 mg | Freq: Once | INTRAMUSCULAR | Status: AC
  Administered 2011-10-06: 4 mg via INTRAVENOUS
  Filled 2011-10-06: qty 2

## 2011-10-06 MED ORDER — HYDROMORPHONE HCL PF 1 MG/ML IJ SOLN
1.0000 mg | Freq: Once | INTRAMUSCULAR | Status: AC
  Administered 2011-10-06: 1 mg via INTRAVENOUS
  Filled 2011-10-06: qty 1

## 2011-10-06 MED ORDER — METOCLOPRAMIDE HCL 5 MG/ML IJ SOLN
10.0000 mg | Freq: Once | INTRAMUSCULAR | Status: AC
  Administered 2011-10-06: 10 mg via INTRAVENOUS
  Filled 2011-10-06: qty 2

## 2011-10-06 MED ORDER — MOXIFLOXACIN HCL 400 MG PO TABS: 400.0000 mg | ORAL_TABLET | Freq: Every day | ORAL | Status: AC

## 2011-10-06 MED ORDER — SODIUM CHLORIDE 0.9 % IV SOLN
INTRAVENOUS | Status: DC
  Administered 2011-10-06: 16:00:00 via INTRAVENOUS

## 2011-10-06 MED ORDER — HYDROMORPHONE HCL PF 1 MG/ML IJ SOLN
0.5000 mg | Freq: Once | INTRAMUSCULAR | Status: AC
  Administered 2011-10-06: 17:00:00 via INTRAVENOUS
  Filled 2011-10-06: qty 1

## 2011-10-06 MED ORDER — MOXIFLOXACIN HCL IN NACL 400 MG/250ML IV SOLN
400.0000 mg | Freq: Once | INTRAVENOUS | Status: AC
  Administered 2011-10-06: 400 mg via INTRAVENOUS
  Filled 2011-10-06: qty 250

## 2011-10-06 NOTE — ED Notes (Signed)
1.Pt had wound on right lower leg for 8 months, is seeing wound care, leg is painful and unable to bear weight. 2. Vomiting for 2 day, unable to keep meds down.  Is a dialysis pt. Last treatment on Thursday--missed going today.

## 2011-10-06 NOTE — ED Notes (Signed)
Wearing sunglasses in triage, d/t right eye infection.  Now reports headache, when asked in triage

## 2011-10-06 NOTE — ED Notes (Signed)
Pt signed the [facility] paper work.

## 2011-10-06 NOTE — ED Notes (Signed)
Pt refused to stay in the hospital. Dr. Alphonzo Grieve states pt can get the one dose of avelox and if she still wants to go she needs to sign the [facility] paperwork.

## 2011-10-06 NOTE — ED Provider Notes (Addendum)
History     CSN: [id number]  Arrival date & time 10/06/11  1344   First MD Initiated Contact with Patient 10/06/11 1428      Chief Complaint: N/v, right leg/foot pain  (Consider location/radiation/quality/duration/timing/severity/associated sxs/prior treatment) The history is provided by the patient and a relative.  pt with hx iddm, diabetic gastroparesis, neuropathy, esrd/hd, presents c/o intermittent nv in past day.  Emesis clear, not bloody or bilious. Occasional mid abd cramping, no current abd pain. No fever or chills. States same symptoms several times in past related to gastroparesis. Also states for past 1 month right leg and foot pain, states fall approx 1 month ago. Has non healing ant/prox tibia wound/ulcer since fall a month ago. Has right lower leg non healing ulcer for past 8-9 months. Has home wound care, denies recent change in these wounds. States walks w walker at baseline. In past couple days, increased pain to foot, ankle area when wt bearing. Today notes redness to right foot. No fever or chills. No new numbness or weakness. Pain, constant, dull worse w wt bearing. No claudication. Goes to hd t/th/sat - skipped her hd today, went 2 days ago. Denies sob.    Past Medical History  Diagnosis Date  . DVT (deep venous thrombosis)     DVT HISTORY  . Renal failure   . Diabetes mellitus   . Migraine     HISTORY  . Anxiety and depression   . CAD (coronary artery disease)   . Bipolar 1 disorder   . Nephrotic syndrome     IN THE PAST  . Hyperlipidemia   . Hyperparathyroidism   . PE (pulmonary embolism)     HISTORY  . Dialysis patient   . Anemia   . Hypertension     Past Surgical History  Procedure Date  . Back surgery X 4  . Cholecystectomy   . Incise and drain abcess     OF THIGHS FROM INSULIN INJECTIONS  . Heart stents     3 - 4  STENTS PLACED  . Portacath placement 2003  . Eye surgery     No family history on file.  History  Substance Use Topics  .  Smoking status: Current Everyday Smoker -- 2.0 packs/day for 20 years    Types: Cigarettes  . Smokeless tobacco: Never Used  . Alcohol Use: No    OB History    Grav Para Term Preterm Abortions TAB SAB Ect Mult Living                  Review of Systems  Constitutional: Negative for fever and chills.  HENT: Negative for neck pain.   Eyes: Negative for redness.  Respiratory: Negative for cough and shortness of breath.   Cardiovascular: Negative for chest pain and leg swelling.  Gastrointestinal: Positive for nausea and vomiting. Negative for abdominal pain, diarrhea and constipation.  Genitourinary: Negative for flank pain.  Musculoskeletal: Negative for back pain.  Skin: Negative for rash.  Neurological: Negative for headaches.  Hematological: Does not bruise/bleed easily.  Psychiatric/Behavioral: Negative for confusion.    Allergies  Compazine; Contrast media; Meperidine hcl; Metformin and related; Morphine; Nubain; Topiramate; and Toradol  Home Medications   Current Outpatient Rx  Name Route Sig Dispense Refill  . ALBUTEROL SULFATE HFA 108 (90 BASE) MCG/ACT IN AERS Inhalation Inhale 2 puffs into the lungs every 6 (six) hours as needed. Shortness of breath      . ALPRAZOLAM 1 MG PO TABS Oral Take  1 tablet (1 mg total) by mouth 4 (four) times daily as needed for anxiety. For anxiety  30 tablet 0  . AMLODIPINE BESYLATE 10 MG PO TABS Oral Take 10 mg by mouth daily.      Marland Kitchen REFRESH P.M. OP Right Eye Place 1 application into the right eye 5 (five) times daily.    Marland Kitchen BRIMONIDINE TARTRATE-TIMOLOL 0.2-0.5 % OP SOLN Right Eye Place 1 drop into the right eye every 12 (twelve) hours.    Marland Kitchen BUTALBITAL-APAP-CAFFEINE 50-325-40 MG PO TABS Oral Take 1 tablet by mouth every 6 (six) hours as needed. For pain    . SYSTANE LID WIPES EX PADS Right Eye Place 1 application into the right eye daily.    . INSULIN GLARGINE 100 UNIT/ML [place] SOLN Subcutaneous Inject 30 Units into the skin at bedtime.    .  INSULIN LISPRO (HUMAN) 100 UNIT/ML [place] SOLN Subcutaneous Inject 7-15 Units into the skin 4 (four) times daily -  before meals and at bedtime. TAKING ON SLIDING SCALE 200-249=7 units 250-299=10 units 300-349=12 units >350=15 units    . LISINOPRIL 40 MG PO TABS Oral Take 40 mg by mouth daily.      Marland Kitchen LOTEPREDNOL ETABONATE 0.2 % OP SUSP Right Eye Place 1 drop into the right eye 4 (four) times daily.      Marland Kitchen LOTEPREDNOL ETABONATE 0.5 % OP SUSP Right Eye Place 1 drop into the right eye 4 (four) times daily.    . OFLOXACIN 0.3 % OP SOLN Right Eye Place 1 drop into the right eye 4 (four) times daily.    Marland Kitchen POLYETHYL GLYCOL-PROPYL GLYCOL 0.4-0.3 % OP SOLN Both Eyes Place 1 drop into both eyes 4 (four) times daily.    Marland Kitchen PROMETHAZINE HCL 25 MG PO TABS  TAKE ONE TABLET EVERY SIX HOURS AS NEEDED 120 tablet 0  . PROMETHAZINE HCL 50 MG/ML IJ SOLN Intramuscular Inject 50 mg into the muscle every 6 (six) hours as needed. For nausea    . ARIPIPRAZOLE 2 MG PO TABS Oral Take 2 mg by mouth daily.      . ATROPINE SULFATE 1 % OP SOLN Right Eye Place 1 drop into the right eye 4 (four) times daily.      Marland Kitchen NEPHRO-VITE 0.8 MG PO TABS Oral Take 0.8 mg by mouth at bedtime.      Marland Kitchen CINACALCET HCL 30 MG PO TABS Oral Take 30 mg by mouth daily.      Marland Kitchen CLONIDINE HCL 0.2 MG PO TABS Oral Take 0.2 mg by mouth 2 (two) times daily.      Marland Kitchen CLOPIDOGREL BISULFATE 75 MG PO TABS Oral Take 75 mg by mouth daily.      Marland Kitchen HOMATROPINE HBR 5 % OP SOLN Right Eye Place 1 drop into the right eye 3 (three) times daily.      Marland Kitchen METOCLOPRAMIDE HCL 10 MG PO TABS  TAKE (2) TABLETS FOUR TIMES DAILY. 240 tablet 1  . OMEPRAZOLE 40 MG PO CPDR Oral Take 1 capsule (40 mg total) by mouth daily before breakfast. Take (1) Capsule Twice Daily 30 Minutes Before Breakfast and 30 Minutes Before Supper 30 capsule 5  . OXYCODONE-ACETAMINOPHEN 10-325 MG PO TABS Oral Take 1 tablet by mouth every 4 (four) hours. For pain    . PAROXETINE HCL 40 MG PO TABS Oral Take 80 mg by  mouth every morning.     Marland Kitchen SEVELAMER CARBONATE 800 MG PO TABS Oral Take 4,000 mg by mouth 3 (three)  times daily with meals.     . SODIUM BICARBONATE 650 MG PO TABS Oral Take 650 mg by mouth 2 (two) times daily.      . SUCRALFATE 1 G PO TABS Oral Take 1 g by mouth 2 (two) times daily before a meal.     . TORSEMIDE 100 MG PO TABS Oral Take 100 mg by mouth daily.        BP 172/84  Temp(Src) 98.4 F (36.9 C) (Oral)  Resp 22  Ht 5\' 10"  (1.778 m)  Wt 190 lb (86.183 kg)  BMI 27.26 kg/m2  SpO2 97%  Physical Exam  Nursing note and vitals reviewed. Constitutional: She appears well-developed and well-nourished. No distress.  Eyes: Conjunctivae are normal. No scleral icterus.  Neck: Neck supple. No tracheal deviation present.  Cardiovascular: Normal rate, regular rhythm, normal heart sounds and intact distal pulses.  Exam reveals no gallop and no friction rub.   No murmur heard. Pulmonary/Chest: Effort normal and breath sounds normal. No respiratory distress.  Abdominal: Soft. Normal appearance and bowel sounds are normal. She exhibits no distension. There is no tenderness.  Musculoskeletal: She exhibits no edema.       Pt with 2-3 cm diameter anterior right tibia ulcer. No purulent drainage or gross malodor. Also w 3 cm diameter right lower leg ulcer, no purulent drainage or malodor. Distal pulses palp. Mild erythema and sl increased warmth to dorsum right foot. +cap refill distally.   Neurological: She is alert.  Skin: Skin is warm and dry. No rash noted.    ED Course  Procedures (including critical care time)  Labs Reviewed - No data to display  Results for orders placed during the hospital encounter of 10/06/11  CBC      Component Value Range   WBC 13.0 (*) 4.0 - 10.5 (K/uL)   RBC 2.97 (*) 3.87 - 5.11 (MIL/uL)   Hemoglobin 10.2 (*) 12.0 - 15.0 (g/dL)   HCT 45.4 (*) 09.8 - 46.0 (%)   MCV 102.0 (*) 78.0 - 100.0 (fL)   MCH 34.3 (*) 26.0 - 34.0 (pg)   MCHC 33.7  30.0 - 36.0 (g/dL)    RDW 11.9 (*) 14.7 - 15.5 (%)   Platelets 250  150 - 400 (K/uL)  DIFFERENTIAL      Component Value Range   Neutrophils Relative 88 (*) 43 - 77 (%)   Neutro Abs 11.5 (*) 1.7 - 7.7 (K/uL)   Lymphocytes Relative 5 (*) 12 - 46 (%)   Lymphs Abs 0.6 (*) 0.7 - 4.0 (K/uL)   Monocytes Relative 7  3 - 12 (%)   Monocytes Absolute 0.9  0.1 - 1.0 (K/uL)   Eosinophils Relative 0  0 - 5 (%)   Eosinophils Absolute 0.0  0.0 - 0.7 (K/uL)   Basophils Relative 0  0 - 1 (%)   Basophils Absolute 0.0  0.0 - 0.1 (K/uL)  BASIC METABOLIC PANEL      Component Value Range   Sodium 134 (*) 135 - 145 (mEq/L)   Potassium 4.1  3.5 - 5.1 (mEq/L)   Chloride 95 (*) 96 - 112 (mEq/L)   CO2 24  19 - 32 (mEq/L)   Glucose, Bld 239 (*) 70 - 99 (mg/dL)   BUN 44 (*) 6 - 23 (mg/dL)   Creatinine, Ser 8.29 (*) 0.50 - 1.10 (mg/dL)   Calcium 56.2  8.4 - 10.5 (mg/dL)   GFR calc non Af Amer 11 (*) >90 (mL/min)   GFR calc Af [place] Dago  13 (*) >90 (mL/min)   Dg Tibia/fibula Right  10/06/2011  *RADIOLOGY REPORT*  Clinical Data: Distal wound check  RIGHT TIBIA AND FIBULA - 2 VIEW  Comparison: None.  Findings: Two views of the right tibia-fibula submitted.  No acute fracture or subluxation.  There is soft tissue irregularity lateral distal fibular region.  Clinical correlation is necessary.  No periosteal reaction or bony erosion.  IMPRESSION: No acute fracture or subluxation.  No periosteal reaction or bony erosion to suggest osteomyelitis.  Original Report Authenticated By: Natasha Mead, M.D.   Dg Foot Complete Right  10/06/2011  *RADIOLOGY REPORT*  Clinical Data: wound check  RIGHT FOOT COMPLETE - 3+ VIEW  Comparison: 02/04/2010  Findings: Three views of the right foot submitted.  No acute fracture or subluxation.  No periosteal reaction or bony erosion.  IMPRESSION: No acute fracture or subluxation.  No definite evidence of osteomyelitis.  Original Report Authenticated By: Natasha Mead, M.D.     MDM  Pt requests pain rx. Dilaudid 1 mg iv. Pt  also c/o nv, hx diabetic gastroparesis. reglan iv. zofran iv.   +cellulitis right foot. avelox iv.   Discussed pt with [facility] hospitalist, Dr Karilyn Cota, who will come to ED to admit.   Dr Karilyn Cota has seen pt in ed, states pt unwilling to be admitted to hospital, now requesting d/c.  He tried to convince to stay, outlining reasons for admission, pt refuses.  I also discussed admit plan with patient, reasons for admission, need for iv abx, monitoring, wound care, etc. Also discussed risks of leaving including worsening infection, loss of limb, sepsis/severe body infection - pt voices understanding of reasons for admission, and risks, pt is not willing to stay, continues to request d/c AMA.   Attempted again to discuss recommendations for admission, reviewing reasons and risks, pt is alert, oriented, understand risks, but refuses to stay.   Suzi Roots, MD 10/06/11 1703  Suzi Roots, MD 10/06/11 [phone]

## 2011-10-06 NOTE — Progress Notes (Signed)
I was asked to evaluate this patient by Dr. Leo Rod, emergency room physician, with a view to admitting this patient for right leg cellulitis. I have evaluated the patient and feels that indeed she does need to be admitted to the hospital for intravenous antibiotics as she appears to have significant right lower leg cellulitis which is likely to get worse without intravenous antibiotics. The patient insists that she does not wish to stay in the hospital and understands the consequences of leaving without appropriate and full treatment. She understands that she may get sicker and may even develop septicemia. I have informed this patient that this decision is against my medical advice but she did not want to change her mind. The patient is clinically competent to make medical decisions at this time. I've asked the patient and informed the nurse that the patient needs to sign a form stating that she left the hospital AGAINST MEDICAL ADVICE.

## 2011-10-26 ENCOUNTER — Encounter: Payer: Self-pay | Admitting: Surgery

## 2011-10-29 ENCOUNTER — Encounter: Payer: Self-pay | Admitting: Surgery

## 2011-10-29 ENCOUNTER — Ambulatory Visit (INDEPENDENT_AMBULATORY_CARE_PROVIDER_SITE_OTHER): Payer: Medicare Other | Admitting: Surgery

## 2011-10-29 VITALS — BP 98/49 | HR 76 | Resp 16 | Ht 70.0 in | Wt 180.0 lb

## 2011-10-29 DIAGNOSIS — I779 Disorder of arteries and arterioles, unspecified: Secondary | ICD-10-CM

## 2011-10-29 DIAGNOSIS — M629 Disorder of muscle, unspecified: Secondary | ICD-10-CM | POA: Insufficient documentation

## 2011-10-29 DIAGNOSIS — I739 Peripheral vascular disease, unspecified: Secondary | ICD-10-CM

## 2011-10-29 NOTE — Progress Notes (Signed)
[facility]    Patient name: [name] MRN: [id number] DOB: 01/25/1971 Sex: female   Referred by: Harold Nichols, M.D.  Reason for referral:  Chief Complaint  Patient presents with  . New Evaluation    Ischemic ulcer Righ leg- Ref by Dr. Cathey- [place] exam 10/25/11 [place]    HISTORY OF PRESENT ILLNESS: The patient presents today for evaluation of right leg ulceration. She has 2 ulcers on her right leg the more distal one has been there since April 2012. The more proximal one just below the knee has been there 6-7 weeks. She's been followed in the wound center for approximately one month. Doppler studies were recently obtained which show an ABI of 0.15 on the right and 0.48 on the left breast the left is 58 it could not be calculated on the right. She is sent today for vascular evaluation.  The patient suffers from diabetes. She states her blood sugars are in the 160-400 range. She does not know what her hemoglobin A1c is. She does suffer from end-stage renal disease and dialyzes on Tuesday Thursday Saturday. She is also medically managed for hypertension hyperlipidemia. She is a history of coronary artery disease and is currently on Plavix. She has a history of a left leg deep vein thrombosis. She's had multiple cardiac stents placed.  Past Medical History  Diagnosis Date  . DVT (deep venous thrombosis)     DVT HISTORY  . Renal failure   . Diabetes mellitus   . Migraine     HISTORY  . Anxiety and depression   . CAD (coronary artery disease)   . Bipolar 1 disorder   . Nephrotic syndrome     IN THE PAST  . Hyperlipidemia   . Hyperparathyroidism   . PE (pulmonary embolism)     HISTORY  . Dialysis patient   . Anemia   . Hypertension     Past Surgical History  Procedure Date  . Back surgery X 4  . Cholecystectomy   . Incise and drain abcess     OF THIGHS FROM INSULIN INJECTIONS  . Heart stents     3 - 4  STENTS PLACED  . Portacath  placement 2003  . Eye surgery     History   Social History  . Marital Status: Divorced    Spouse Name: N/A    Number of Children: N/A  . Years of Education: N/A   Occupational History  . Not on file.   Social History Main Topics  . Smoking status: Current Everyday Smoker -- 2.0 packs/day for 20 years    Types: Cigarettes  . Smokeless tobacco: Never Used  . Alcohol Use: No  . Drug Use: No  . Sexually Active: No   Other Topics Concern  . Not on file   Social History Narrative  . No narrative on file    History reviewed. No pertinent family history.  Allergies as of 10/29/2011 - Review Complete 10/29/2011  Allergen Reaction Noted  . Compazine (prochlorperazine) Swelling 06/25/2011  . Contrast media (iodinated diagnostic agents)  06/25/2011  . Meperidine hcl Swelling   . Metformin and related Other (See Comments) 08/20/2011  . Morphine Swelling   . Nubain (nalbuphine hcl) Swelling 04/15/2011  . Topiramate (topamax) Swelling 06/25/2011  . Toradol (ketorolac tromethamine) Swelling 04/15/2011    Current Outpatient Prescriptions on File Prior to Visit  Medication Sig Dispense Refill  . albuterol (PROVENTIL HFA;VENTOLIN HFA) 108 (90 BASE) MCG/ACT inhaler Inhale 2   puffs into the lungs every 6 (six) hours as needed. Shortness of breath        . ALPRAZolam (XANAX) 1 MG tablet Take 1 tablet (1 mg total) by mouth 4 (four) times daily as needed for anxiety. For anxiety   30 tablet  0  . amLODipine (NORVASC) 10 MG tablet Take 10 mg by mouth daily.        . ARIPiprazole (ABILIFY) 2 MG tablet Take 2 mg by mouth daily.        . Artificial Tear Ointment (REFRESH P.M. OP) Place 1 application into the right eye 5 (five) times daily.      . atropine 1 % ophthalmic solution Place 1 drop into the right eye 4 (four) times daily.        . b complex-vitamin c-folic acid (NEPHRO-VITE) 0.8 MG TABS Take 0.8 mg by mouth at bedtime.        . brimonidine-timolol (COMBIGAN) 0.2-0.5 % ophthalmic  solution Place 1 drop into the right eye every 12 (twelve) hours.      . butalbital-acetaminophen-caffeine (FIORICET, ESGIC) 50-325-40 MG per tablet Take 1 tablet by mouth every 6 (six) hours as needed. For pain      . cinacalcet (SENSIPAR) 30 MG tablet Take 30 mg by mouth daily.        . cloNIDine (CATAPRES) 0.2 MG tablet Take 0.2 mg by mouth 2 (two) times daily.       . clopidogrel (PLAVIX) 75 MG tablet Take 75 mg by mouth daily.        . Eyelid Cleansers (SYSTANE LID WIPES) PADS Place 1 application into the right eye daily.      . insulin glargine (LANTUS) 100 UNIT/ML injection Inject 30 Units into the skin at bedtime.      . insulin lispro (HUMALOG) 100 UNIT/ML injection Inject 7-15 Units into the skin 4 (four) times daily -  before meals and at bedtime. TAKING ON SLIDING SCALE 200-249=7 units 250-299=10 units 300-349=12 units >350=15 units      . lisinopril (PRINIVIL,ZESTRIL) 40 MG tablet Take 40 mg by mouth daily.        . loteprednol (LOTEMAX) 0.5 % ophthalmic suspension Place 1 drop into the right eye 4 (four) times daily.      . metoCLOPramide (REGLAN) 10 MG tablet TAKE (2) TABLETS FOUR TIMES DAILY.  240 tablet  1  . ofloxacin (OCUFLOX) 0.3 % ophthalmic solution Place 1 drop into the right eye 4 (four) times daily.      . omeprazole (PRILOSEC) 40 MG capsule Take 1 capsule (40 mg total) by mouth daily before breakfast. Take (1) Capsule Twice Daily 30 Minutes Before Breakfast and 30 Minutes Before Supper  30 capsule  5  . oxyCODONE-acetaminophen (PERCOCET) 10-325 MG per tablet Take 1 tablet by mouth every 4 (four) hours. For pain      . PARoxetine (PAXIL) 40 MG tablet Take 80 mg by mouth every morning.       . Polyethyl Glycol-Propyl Glycol (SYSTANE ULTRA) 0.4-0.3 % SOLN Place 1 drop into both eyes 4 (four) times daily.      . promethazine (PHENERGAN) 25 MG tablet TAKE ONE TABLET EVERY SIX HOURS AS NEEDED  120 tablet  0  . promethazine (PHENERGAN) 50 MG/ML injection Inject 50 mg into the  muscle every 6 (six) hours as needed. For nausea      . sevelamer (RENVELA) 800 MG tablet Take 4,000 mg by mouth 3 (three) times daily with meals.       .   sodium bicarbonate 650 MG tablet Take 650 mg by mouth 2 (two) times daily.        . sucralfate (CARAFATE) 1 G tablet Take 1 g by mouth 2 (two) times daily before a meal.       . torsemide (DEMADEX) 100 MG tablet Take 100 mg by mouth daily.        . homatropine 5 % ophthalmic solution Place 1 drop into the right eye 3 (three) times daily.        . loteprednol (LOTEMAX) 0.2 % SUSP Place 1 drop into the right eye 4 (four) times daily.           REVIEW OF SYSTEMS: Positive for pain in her legs with walking pain or even lying flat, history of DVT, leg swelling pulmonary is positive for productive cough and wheezing. Neurologic positive weakness in her arms and legs, numbness in arms or legs, temporary loss of vision, dizziness. Site is positive history depression constitutional positive for fever and chills. Other review of systems are negative as documented encounter for   PHYSICAL EXAMINATION: General: The patient appears their stated age.  Vital signs are BP 98/49  Pulse 76  Resp 16  Ht 5' 10" (1.778 m)  Wt 180 lb (81.647 kg)  BMI 25.83 kg/m2  SpO2 97% HEENT:  No gross abnormalities Pulmonary: Respirations are non-labored Abdomen: Soft and non-tender  Musculoskeletal: There are no major deformities.   Neurologic: No focal weakness or paresthesias are detected, Skin: There are no ulcer or rashes noted. Psychiatric: The patient has normal affect. Cardiovascular: Palpable left femoral pulse. I cannot palpate a right femoral pulse. Pedal pulses are nonpalpable. She has significant bilateral lower extremity edema  Diagnostic Studies: Outside ankle-brachial indices are 0.15 on the right and 0.48 on the left  Outside Studies/Documentation   Assessment:  Right leg ischemic ulcer Plan: I discussed that this is a limb threatening  situation. I feel the first step is to proceed with angiography. If at that time she has a lesion amenable to stenting out proceed at that time. L. plan on accessing the left groin doing an aortogram and studying both legs. This is been scheduled for Tuesday, March 12. The risks of bleeding embolization were discussed with the patient. She will continue on her aspirin and Plavix     V. Wells Zebastian Carico IV, M.D. [facility] [place] Office: [phone] Pager:  [phone]  

## 2011-10-30 ENCOUNTER — Emergency Department (HOSPITAL_COMMUNITY): Payer: Medicare Other

## 2011-10-30 ENCOUNTER — Other Ambulatory Visit: Payer: Self-pay | Admitting: *Deleted

## 2011-10-30 ENCOUNTER — Inpatient Hospital Stay (HOSPITAL_COMMUNITY)
Admission: EM | Admit: 2011-10-30 | Discharge: 2011-11-03 | DRG: 166 | Disposition: A | Discharge status: Home Health | Payer: Medicare Other | Attending: Internal Medicine | Admitting: Internal Medicine

## 2011-10-30 ENCOUNTER — Encounter (HOSPITAL_COMMUNITY): Payer: Self-pay | Admitting: *Deleted

## 2011-10-30 DIAGNOSIS — E785 Hyperlipidemia, unspecified: Secondary | ICD-10-CM | POA: Diagnosis present

## 2011-10-30 DIAGNOSIS — Z992 Dependence on renal dialysis: Secondary | ICD-10-CM

## 2011-10-30 DIAGNOSIS — N186 End stage renal disease: Secondary | ICD-10-CM

## 2011-10-30 DIAGNOSIS — S70259A Superficial foreign body, unspecified hip, initial encounter: Secondary | ICD-10-CM

## 2011-10-30 DIAGNOSIS — J90 Pleural effusion, not elsewhere classified: Secondary | ICD-10-CM

## 2011-10-30 DIAGNOSIS — Z86718 Personal history of other venous thrombosis and embolism: Secondary | ICD-10-CM

## 2011-10-30 DIAGNOSIS — N039 Chronic nephritic syndrome with unspecified morphologic changes: Secondary | ICD-10-CM | POA: Diagnosis present

## 2011-10-30 DIAGNOSIS — I739 Peripheral vascular disease, unspecified: Secondary | ICD-10-CM

## 2011-10-30 DIAGNOSIS — F17200 Nicotine dependence, unspecified, uncomplicated: Secondary | ICD-10-CM | POA: Diagnosis present

## 2011-10-30 DIAGNOSIS — Z79899 Other long term (current) drug therapy: Secondary | ICD-10-CM

## 2011-10-30 DIAGNOSIS — Z9861 Coronary angioplasty status: Secondary | ICD-10-CM

## 2011-10-30 DIAGNOSIS — L97909 Non-pressure chronic ulcer of unspecified part of unspecified lower leg with unspecified severity: Secondary | ICD-10-CM | POA: Diagnosis present

## 2011-10-30 DIAGNOSIS — K3184 Gastroparesis: Secondary | ICD-10-CM

## 2011-10-30 DIAGNOSIS — R0989 Other specified symptoms and signs involving the circulatory and respiratory systems: Secondary | ICD-10-CM

## 2011-10-30 DIAGNOSIS — D631 Anemia in chronic kidney disease: Secondary | ICD-10-CM | POA: Diagnosis present

## 2011-10-30 DIAGNOSIS — I120 Hypertensive chronic kidney disease with stage 5 chronic kidney disease or end stage renal disease: Secondary | ICD-10-CM | POA: Diagnosis present

## 2011-10-30 DIAGNOSIS — E1143 Type 2 diabetes mellitus with diabetic autonomic (poly)neuropathy: Secondary | ICD-10-CM

## 2011-10-30 DIAGNOSIS — E871 Hypo-osmolality and hyponatremia: Secondary | ICD-10-CM

## 2011-10-30 DIAGNOSIS — L03115 Cellulitis of right lower limb: Secondary | ICD-10-CM

## 2011-10-30 DIAGNOSIS — I82409 Acute embolism and thrombosis of unspecified deep veins of unspecified lower extremity: Secondary | ICD-10-CM

## 2011-10-30 DIAGNOSIS — N390 Urinary tract infection, site not specified: Secondary | ICD-10-CM

## 2011-10-30 DIAGNOSIS — Y832 Surgical operation with anastomosis, bypass or graft as the cause of abnormal reaction of the patient, or of later complication, without mention of misadventure at the time of the procedure: Secondary | ICD-10-CM | POA: Diagnosis not present

## 2011-10-30 DIAGNOSIS — E118 Type 2 diabetes mellitus with unspecified complications: Secondary | ICD-10-CM

## 2011-10-30 DIAGNOSIS — T82898A Other specified complication of vascular prosthetic devices, implants and grafts, initial encounter: Secondary | ICD-10-CM | POA: Diagnosis not present

## 2011-10-30 DIAGNOSIS — Z86711 Personal history of pulmonary embolism: Secondary | ICD-10-CM

## 2011-10-30 DIAGNOSIS — R072 Precordial pain: Secondary | ICD-10-CM

## 2011-10-30 DIAGNOSIS — D649 Anemia, unspecified: Secondary | ICD-10-CM

## 2011-10-30 DIAGNOSIS — L97919 Non-pressure chronic ulcer of unspecified part of right lower leg with unspecified severity: Secondary | ICD-10-CM

## 2011-10-30 DIAGNOSIS — J189 Pneumonia, unspecified organism: Principal | ICD-10-CM

## 2011-10-30 DIAGNOSIS — N049 Nephrotic syndrome with unspecified morphologic changes: Secondary | ICD-10-CM

## 2011-10-30 DIAGNOSIS — F319 Bipolar disorder, unspecified: Secondary | ICD-10-CM

## 2011-10-30 DIAGNOSIS — K219 Gastro-esophageal reflux disease without esophagitis: Secondary | ICD-10-CM

## 2011-10-30 DIAGNOSIS — Z794 Long term (current) use of insulin: Secondary | ICD-10-CM

## 2011-10-30 DIAGNOSIS — N2581 Secondary hyperparathyroidism of renal origin: Secondary | ICD-10-CM

## 2011-10-30 DIAGNOSIS — I2510 Atherosclerotic heart disease of native coronary artery without angina pectoris: Secondary | ICD-10-CM

## 2011-10-30 DIAGNOSIS — G8929 Other chronic pain: Secondary | ICD-10-CM | POA: Diagnosis present

## 2011-10-30 DIAGNOSIS — R0602 Shortness of breath: Secondary | ICD-10-CM

## 2011-10-30 DIAGNOSIS — B952 Enterococcus as the cause of diseases classified elsewhere: Secondary | ICD-10-CM | POA: Diagnosis present

## 2011-10-30 DIAGNOSIS — E1169 Type 2 diabetes mellitus with other specified complication: Secondary | ICD-10-CM | POA: Diagnosis present

## 2011-10-30 DIAGNOSIS — Z720 Tobacco use: Secondary | ICD-10-CM

## 2011-10-30 DIAGNOSIS — E11622 Type 2 diabetes mellitus with other skin ulcer: Secondary | ICD-10-CM

## 2011-10-30 DIAGNOSIS — M629 Disorder of muscle, unspecified: Secondary | ICD-10-CM

## 2011-10-30 DIAGNOSIS — I10 Essential (primary) hypertension: Secondary | ICD-10-CM

## 2011-10-30 DIAGNOSIS — L97809 Non-pressure chronic ulcer of other part of unspecified lower leg with unspecified severity: Secondary | ICD-10-CM | POA: Diagnosis present

## 2011-10-30 DIAGNOSIS — E119 Type 2 diabetes mellitus without complications: Secondary | ICD-10-CM

## 2011-10-30 DIAGNOSIS — E1149 Type 2 diabetes mellitus with other diabetic neurological complication: Secondary | ICD-10-CM | POA: Diagnosis present

## 2011-10-30 DIAGNOSIS — R1110 Vomiting, unspecified: Secondary | ICD-10-CM

## 2011-10-30 DIAGNOSIS — F341 Dysthymic disorder: Secondary | ICD-10-CM | POA: Diagnosis present

## 2011-10-30 DIAGNOSIS — R609 Edema, unspecified: Secondary | ICD-10-CM

## 2011-10-30 DIAGNOSIS — L97929 Non-pressure chronic ulcer of unspecified part of left lower leg with unspecified severity: Secondary | ICD-10-CM

## 2011-10-30 LAB — DIFFERENTIAL
Basophils Relative: 0 % (ref 0–1)
Eosinophils Absolute: 0 10*3/uL (ref 0.0–0.7)
Lymphs Abs: 0.8 10*3/uL (ref 0.7–4.0)
Neutro Abs: 12.5 10*3/uL — ABNORMAL HIGH (ref 1.7–7.7)
Neutrophils Relative %: 92 % — ABNORMAL HIGH (ref 43–77)

## 2011-10-30 LAB — URINALYSIS, ROUTINE W REFLEX MICROSCOPIC
Glucose, UA: 250 mg/dL — AB
Protein, ur: 100 mg/dL — AB
Specific Gravity, Urine: 1.02 (ref 1.005–1.030)
Urobilinogen, UA: 0.2 mg/dL (ref 0.0–1.0)

## 2011-10-30 LAB — CBC
Hemoglobin: 11.2 g/dL — ABNORMAL LOW (ref 12.0–15.0)
MCH: 33.1 pg (ref 26.0–34.0)
Platelets: 405 10*3/uL — ABNORMAL HIGH (ref 150–400)
RBC: 3.38 MIL/uL — ABNORMAL LOW (ref 3.87–5.11)

## 2011-10-30 LAB — COMPREHENSIVE METABOLIC PANEL
AST: 10 U/L (ref 0–37)
Albumin: 2.7 g/dL — ABNORMAL LOW (ref 3.5–5.2)
Calcium: 10.4 mg/dL (ref 8.4–10.5)
Creatinine, Ser: 5.72 mg/dL — ABNORMAL HIGH (ref 0.50–1.10)
Sodium: 128 mEq/L — ABNORMAL LOW (ref 135–145)

## 2011-10-30 LAB — URINE MICROSCOPIC-ADD ON

## 2011-10-30 LAB — LIPASE, BLOOD: Lipase: 20 U/L (ref 11–59)

## 2011-10-30 IMAGING — XA IR SHUNTOGRAM/ FISTULAGRAM
1 series · 12 of 24 positions shown · non-contrast
Comparison: None

CLINICAL DATA: End-stage renal disease with left forearm dialysis
graft.  Low access flow rates via graft.

1.  DIALYSIS AV SHUNTOGRAM
2.  ANGIOPLASTY OF VENOUS ANASTOMOSIS AND OUTFLOW VEIN

[Series 1: run · 12 of 57 slices shown]
[im 3/57]
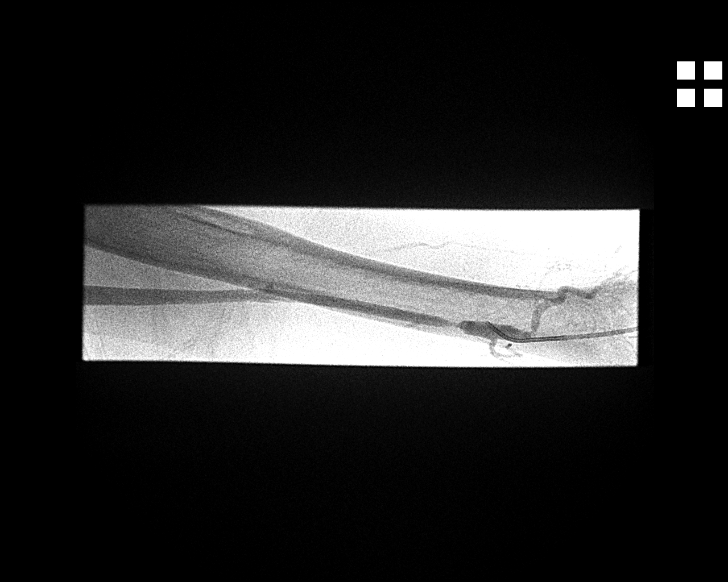
[im 8/57]
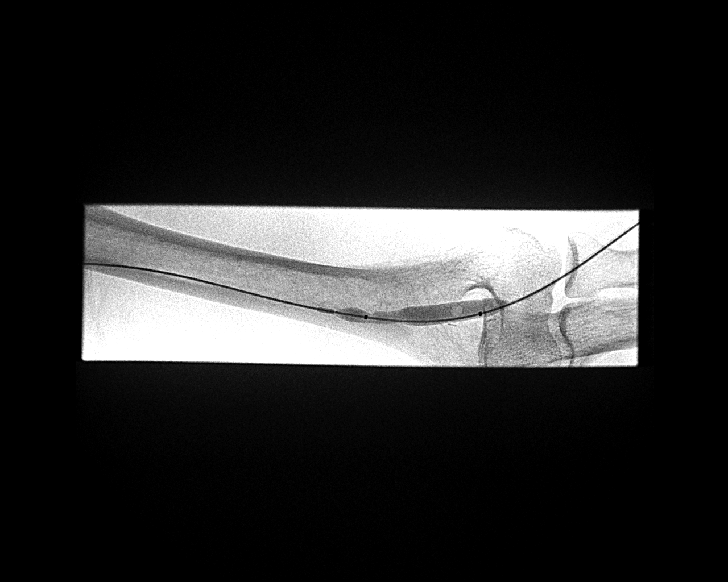
[im 13/57]
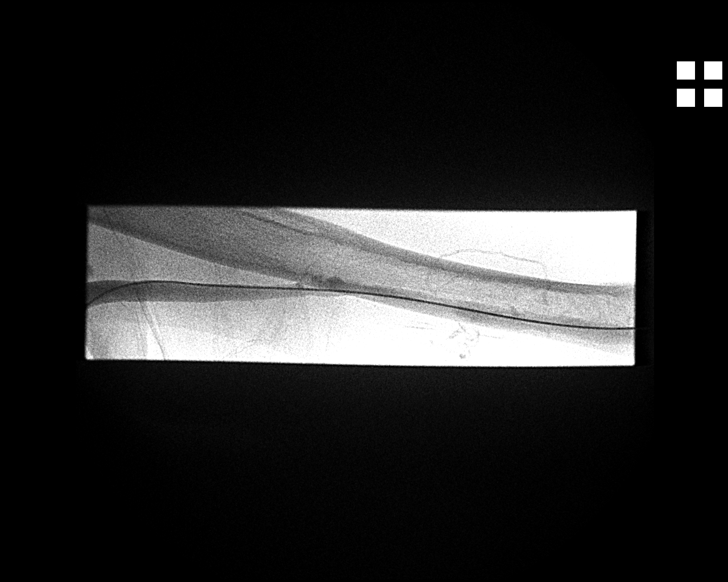
[im 18/57]
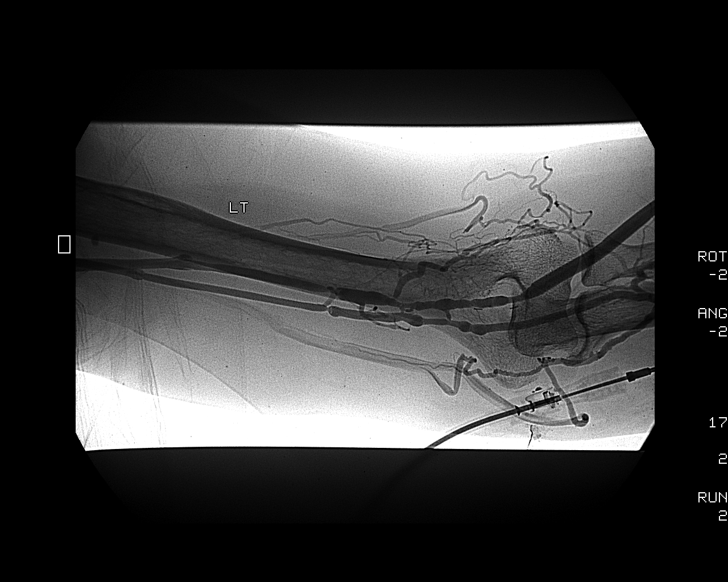
[im 22/57]
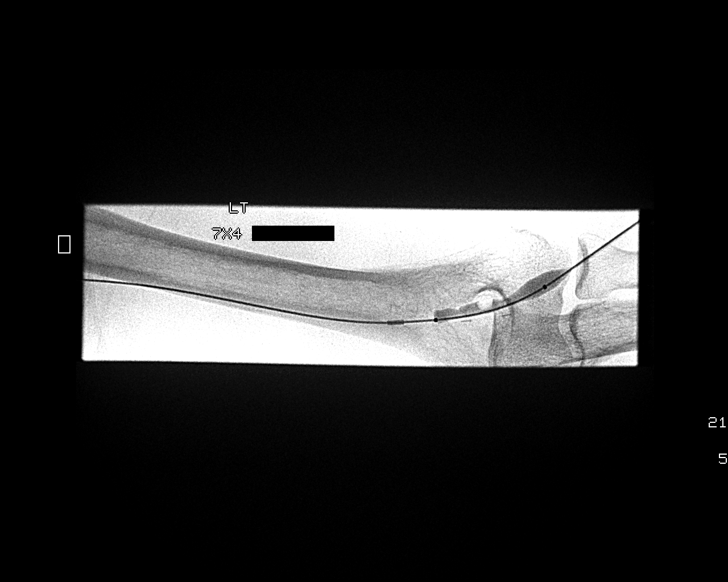
[im 27/57]
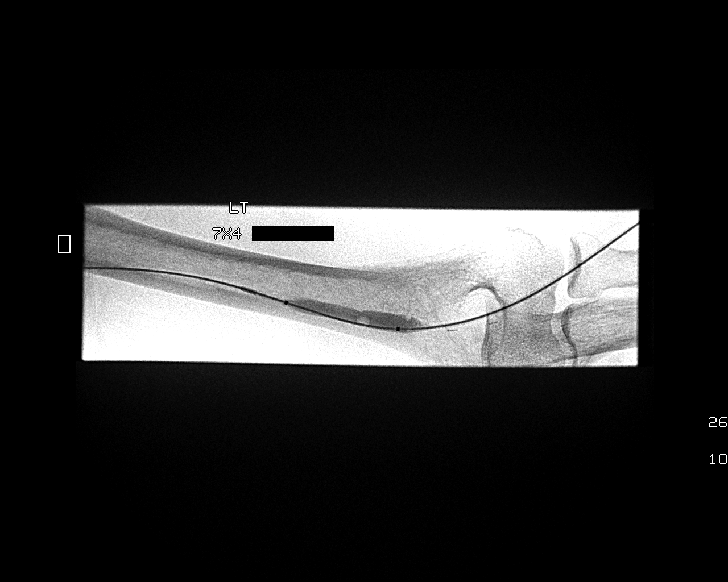
[im 32/57]
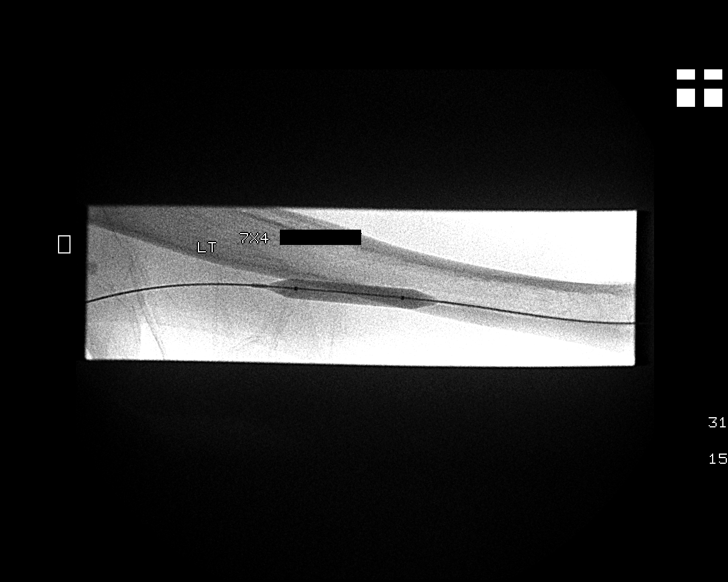
[im 37/57]
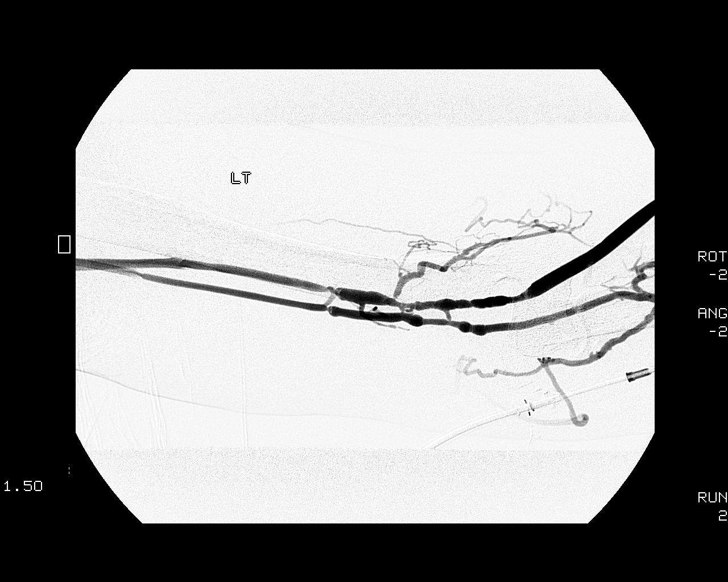
[im 42/57]
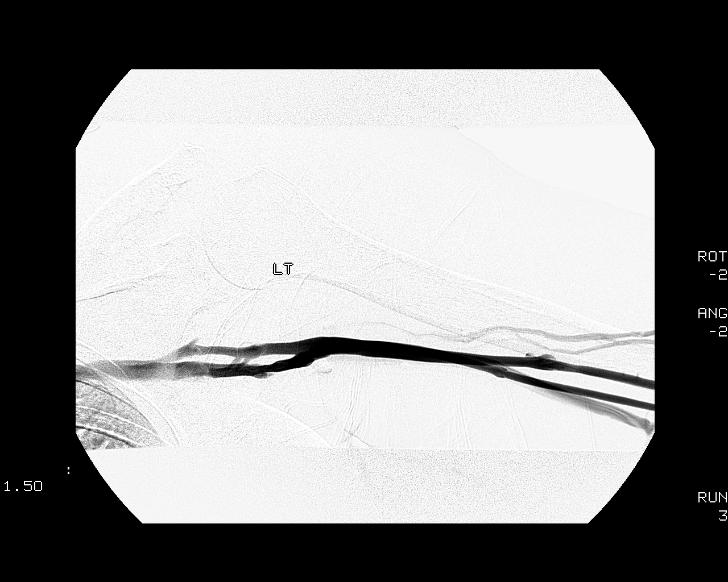
[im 47/57]
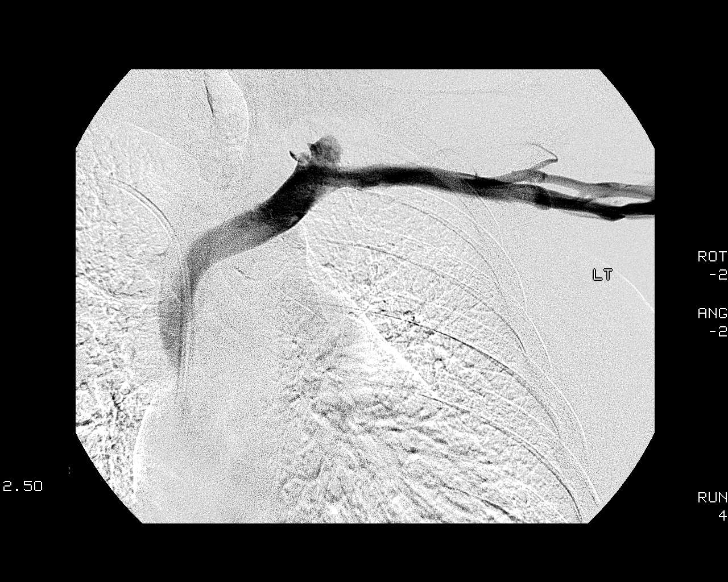
[im 52/57]
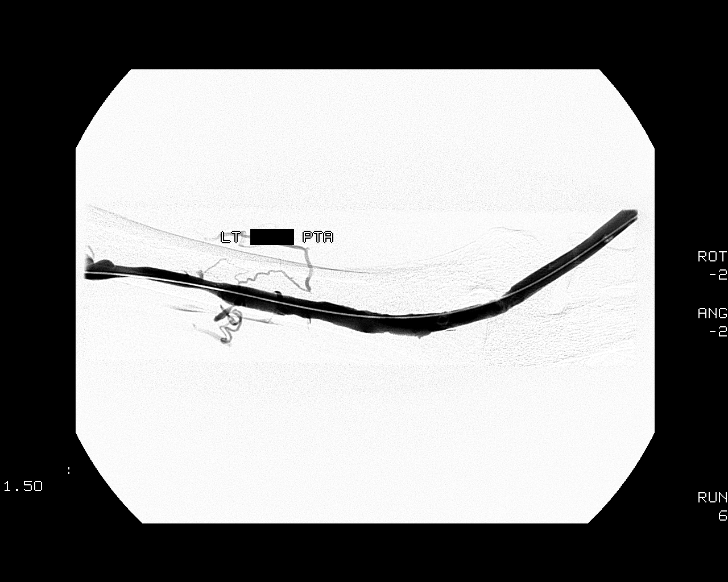
[im 57/57]
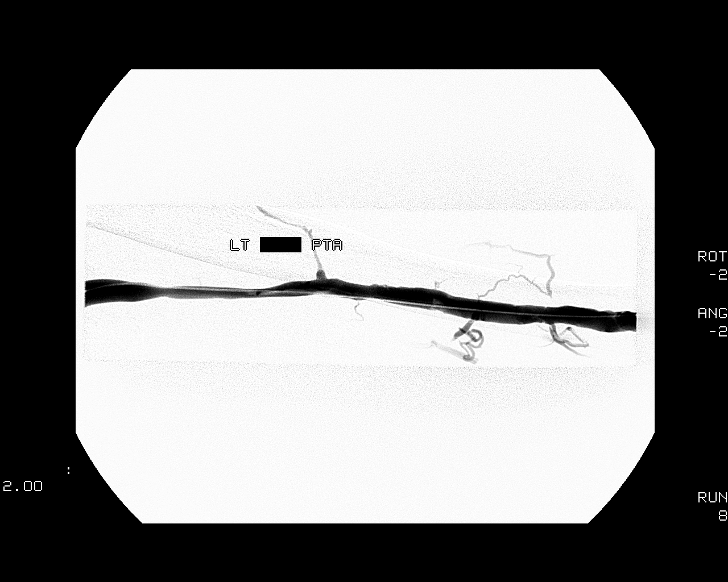

[12 of 24 positions shown; findings below may reference images not displayed]

Sedation:  2 mg IV Versed, 75 mcg IV Fentanyl.

Total Moderate Sedation Time:  35 minutes.

Contrast:  80 ml [id number]

Fluoroscopy Time: 5.0 minutes.

Procedure:  The procedure, risks, benefits, and alternatives were
explained to the patient.  Questions regarding the procedure were
encouraged and answered.  The patient understands and consents to
the procedure.

The left arm dialysis graft was prepped with Betadine in a sterile
fashion, and a sterile drape was applied covering the operative
field.  A diagnostic shunt study was performed via an 18 gauge
angiocatheter introduced into venous outflow.  Venous drainage was
assessed to the level of the central veins in the chest.  Proximal
shunt was studied by reflux maneuver with temporary compression of
venous outflow.

The angiocath was removed and replaced with a 6-French sheath over
a guidewire.  Balloon angioplasty of the venous anastomosis and
outflow vein was then performed with a 7 mm x 4 cm Conquest
balloon.  Balloon angioplasty was followed by additional
angiography.

The sheath was removed and hemostasis obtained with application of
a 2-0 Ethilon pursestring suture.

Complications: Venous dissection.
FINDINGS: Significant stenosis is noted beginning at the venous
anastomosis of the graft with focal 75% stenosis present.  There
then is significant narrowing of the outflow vein in the upper arm
just above antecubital fossa with multiple tandem areas of
narrowing present.  The greatest area of narrowing approaches 80 -
90%.  Several collateral veins are seen to fill.  Other venous
drainage is widely patent including central veins in the chest.
The arterial anastomosis shows normal patency.

After 7 mm balloon angioplasty, the venous anastomosis and
immediate segment of the draining vein responded well with mild
areas of irregularity remaining but no significant stenosis.  A
segment of the upper arm draining vein did demonstrate focal venous
dissection.  This was treated with sustained balloon inflation with
improved result and no evidence of contrast extravasation.
IMPRESSION: Significant stenosis involving the venous anastomosis of the graft
and several areas of tandem high-grade narrowing in the outflow
vein of the upper arm.  This was treated successfully with 7 mm
balloon angioplasty.  Focal venous dissection in the draining vein
was additionally treated with sustained balloon inflation
successfully.

Access Management: The dialysis graft remains amenable to
percutaneous intervention.

## 2011-10-30 MED ORDER — POLYVINYL ALCOHOL 1.4 % OP SOLN
1.0000 [drp] | Freq: Four times a day (QID) | OPHTHALMIC | Status: DC
  Administered 2011-11-01 – 2011-11-02 (×7): 1 [drp] via OPHTHALMIC
  Filled 2011-10-30: qty 15

## 2011-10-30 MED ORDER — OXYCODONE-ACETAMINOPHEN 10-325 MG PO TABS: 1.0000 | ORAL_TABLET | ORAL | Status: DC

## 2011-10-30 MED ORDER — GATIFLOXACIN 0.5 % OP SOLN
1.0000 [drp] | Freq: Four times a day (QID) | OPHTHALMIC | Status: DC
  Administered 2011-10-31 – 2011-11-03 (×8): 1 [drp] via OPHTHALMIC
  Filled 2011-10-30: qty 2.5

## 2011-10-30 MED ORDER — CINACALCET HCL 30 MG PO TABS
30.0000 mg | ORAL_TABLET | Freq: Every day | ORAL | Status: DC
  Administered 2011-11-01: 30 mg via ORAL
  Filled 2011-10-30 (×6): qty 1

## 2011-10-30 MED ORDER — BRIMONIDINE TARTRATE-TIMOLOL 0.2-0.5 % OP SOLN: 1.0000 [drp] | Freq: Two times a day (BID) | OPHTHALMIC | Status: DC

## 2011-10-30 MED ORDER — VANCOMYCIN HCL IN DEXTROSE 1-5 GM/200ML-% IV SOLN: 1000.0000 mg | Freq: Once | INTRAVENOUS | Status: DC

## 2011-10-30 MED ORDER — HYDROMORPHONE HCL PF 1 MG/ML IJ SOLN
1.0000 mg | Freq: Once | INTRAMUSCULAR | Status: AC
  Administered 2011-10-30: 1 mg via INTRAMUSCULAR
  Filled 2011-10-30: qty 1

## 2011-10-30 MED ORDER — HYDROMORPHONE HCL PF 1 MG/ML IJ SOLN: 1.0000 mg | Freq: Once | INTRAMUSCULAR | Status: DC

## 2011-10-30 MED ORDER — SEVELAMER CARBONATE 800 MG PO TABS
1600.0000 mg | ORAL_TABLET | Freq: Every day | ORAL | Status: DC
  Filled 2011-10-30 (×9): qty 5

## 2011-10-30 MED ORDER — SODIUM BICARBONATE 650 MG PO TABS
650.0000 mg | ORAL_TABLET | Freq: Two times a day (BID) | ORAL | Status: DC
  Administered 2011-10-31 – 2011-11-02 (×6): 650 mg via ORAL
  Filled 2011-10-30 (×11): qty 1

## 2011-10-30 MED ORDER — DEXTROSE 5 % IV SOLN
1.0000 g | Freq: Once | INTRAVENOUS | Status: AC
  Administered 2011-10-30: 1 g via INTRAVENOUS
  Filled 2011-10-30: qty 1

## 2011-10-30 MED ORDER — NICOTINE 14 MG/24HR TD PT24
14.0000 mg | MEDICATED_PATCH | Freq: Every day | TRANSDERMAL | Status: DC
  Filled 2011-10-30 (×3): qty 1

## 2011-10-30 MED ORDER — OFLOXACIN 0.3 % OP SOLN
1.0000 [drp] | Freq: Three times a day (TID) | OPHTHALMIC | Status: DC
  Administered 2011-11-01 – 2011-11-02 (×5): 1 [drp] via OPHTHALMIC
  Filled 2011-10-30: qty 5

## 2011-10-30 MED ORDER — ALPRAZOLAM 1 MG PO TABS
1.0000 mg | ORAL_TABLET | Freq: Four times a day (QID) | ORAL | Status: DC | PRN
  Administered 2011-10-31 – 2011-11-02 (×7): 1 mg via ORAL
  Filled 2011-10-30 (×7): qty 1

## 2011-10-30 MED ORDER — SUCRALFATE 1 G PO TABS
1.0000 g | ORAL_TABLET | Freq: Two times a day (BID) | ORAL | Status: DC
  Administered 2011-10-31 – 2011-11-03 (×5): 1 g via ORAL
  Filled 2011-10-30 (×9): qty 1

## 2011-10-30 MED ORDER — PAROXETINE HCL 20 MG PO TABS
80.0000 mg | ORAL_TABLET | Freq: Every day | ORAL | Status: DC
Start: 2011-10-31 — End: 2011-11-03
  Administered 2011-11-01 – 2011-11-02 (×2): 80 mg via ORAL
  Filled 2011-10-30: qty 1
  Filled 2011-10-30 (×3): qty 4
  Filled 2011-10-30: qty 1

## 2011-10-30 MED ORDER — METOCLOPRAMIDE HCL 5 MG/ML IJ SOLN
10.0000 mg | Freq: Once | INTRAMUSCULAR | Status: AC
  Administered 2011-10-31: 10 mg via INTRAVENOUS
  Filled 2011-10-30: qty 2

## 2011-10-30 MED ORDER — INSULIN GLARGINE 100 UNIT/ML ~~LOC~~ SOLN
30.0000 [IU] | Freq: Every day | SUBCUTANEOUS | Status: DC
  Administered 2011-10-31 – 2011-11-02 (×3): 30 [IU] via SUBCUTANEOUS
  Filled 2011-10-30: qty 3

## 2011-10-30 MED ORDER — CLOPIDOGREL BISULFATE 75 MG PO TABS
75.0000 mg | ORAL_TABLET | Freq: Every day | ORAL | Status: DC
  Administered 2011-11-01: 75 mg via ORAL
  Filled 2011-10-30 (×2): qty 1

## 2011-10-30 MED ORDER — TRAVOPROST 0.004 % OP SOLN
1.0000 [drp] | Freq: Every day | OPHTHALMIC | Status: DC
  Administered 2011-11-01 – 2011-11-02 (×2): 1 [drp] via OPHTHALMIC
  Filled 2011-10-30 (×4): qty 0.1

## 2011-10-30 MED ORDER — SYSTANE LID WIPES EX PADS: 1.0000 "application " | MEDICATED_PAD | Freq: Every day | CUTANEOUS | Status: DC

## 2011-10-30 MED ORDER — KETOROLAC TROMETHAMINE 0.5 % OP SOLN
1.0000 [drp] | Freq: Three times a day (TID) | OPHTHALMIC | Status: DC
  Filled 2011-10-30: qty 3

## 2011-10-30 MED ORDER — ALBUTEROL SULFATE HFA 108 (90 BASE) MCG/ACT IN AERS: 2.0000 | INHALATION_SPRAY | Freq: Four times a day (QID) | RESPIRATORY_TRACT | Status: DC | PRN

## 2011-10-30 MED ORDER — ARIPIPRAZOLE 2 MG PO TABS
2.0000 mg | ORAL_TABLET | Freq: Every day | ORAL | Status: DC
  Administered 2011-11-01: 2 mg via ORAL
  Filled 2011-10-30 (×6): qty 1

## 2011-10-30 MED ORDER — BRIMONIDINE TARTRATE 0.15 % OP SOLN
1.0000 [drp] | Freq: Two times a day (BID) | OPHTHALMIC | Status: DC
  Administered 2011-10-31: 1 [drp] via OPHTHALMIC
  Filled 2011-10-30: qty 5

## 2011-10-30 MED ORDER — CLONIDINE HCL 0.2 MG PO TABS
0.2000 mg | ORAL_TABLET | Freq: Two times a day (BID) | ORAL | Status: DC
  Administered 2011-10-31 – 2011-11-02 (×6): 0.2 mg via ORAL
  Filled 2011-10-30 (×7): qty 1

## 2011-10-30 MED ORDER — SODIUM CHLORIDE 0.9 % IJ SOLN
3.0000 mL | Freq: Two times a day (BID) | INTRAMUSCULAR | Status: DC
  Administered 2011-10-31 – 2011-11-01 (×3): 3 mL via INTRAVENOUS
  Filled 2011-10-30 (×3): qty 3

## 2011-10-30 MED ORDER — HYDROMORPHONE HCL PF 1 MG/ML IJ SOLN
1.0000 mg | Freq: Once | INTRAMUSCULAR | Status: AC
  Administered 2011-11-01: 1 mg via INTRAMUSCULAR
  Filled 2011-10-30: qty 1

## 2011-10-30 MED ORDER — SODIUM CHLORIDE 0.9 % IV SOLN: INTRAVENOUS | Status: DC

## 2011-10-30 MED ORDER — SODIUM CHLORIDE 0.9 % IV SOLN: 250.0000 mL | INTRAVENOUS | Status: DC | PRN

## 2011-10-30 MED ORDER — RENA-VITE PO TABS
1.0000 | ORAL_TABLET | Freq: Every day | ORAL | Status: DC
  Administered 2011-11-01 – 2011-11-02 (×2): 1 via ORAL
  Filled 2011-10-30 (×9): qty 1

## 2011-10-30 MED ORDER — LEVOFLOXACIN IN D5W 750 MG/150ML IV SOLN
750.0000 mg | Freq: Once | INTRAVENOUS | Status: AC
  Administered 2011-10-31: 750 mg via INTRAVENOUS

## 2011-10-30 MED ORDER — LOTEPREDNOL ETABONATE 0.5 % OP SUSP
1.0000 [drp] | Freq: Four times a day (QID) | OPHTHALMIC | Status: DC
  Administered 2011-11-01 – 2011-11-02 (×7): 1 [drp] via OPHTHALMIC
  Filled 2011-10-30: qty 5

## 2011-10-30 MED ORDER — ONDANSETRON HCL 4 MG/2ML IJ SOLN: 4.0000 mg | INTRAMUSCULAR | Status: DC | PRN

## 2011-10-30 MED ORDER — ONDANSETRON 8 MG PO TBDP
8.0000 mg | ORAL_TABLET | Freq: Once | ORAL | Status: AC
  Administered 2011-10-30: 8 mg via ORAL
  Filled 2011-10-30: qty 1

## 2011-10-30 MED ORDER — ACETAMINOPHEN 325 MG PO TABS: 650.0000 mg | ORAL_TABLET | Freq: Four times a day (QID) | ORAL | Status: DC | PRN

## 2011-10-30 MED ORDER — TORSEMIDE 20 MG PO TABS
100.0000 mg | ORAL_TABLET | Freq: Every day | ORAL | Status: DC
  Administered 2011-11-01 – 2011-11-02 (×2): 100 mg via ORAL
  Filled 2011-10-30: qty 5
  Filled 2011-10-30 (×2): qty 1
  Filled 2011-10-30 (×3): qty 5

## 2011-10-30 MED ORDER — PANTOPRAZOLE SODIUM 40 MG PO TBEC
40.0000 mg | DELAYED_RELEASE_TABLET | Freq: Every day | ORAL | Status: DC
  Administered 2011-10-31 – 2011-11-01 (×2): 40 mg via ORAL
  Filled 2011-10-30 (×2): qty 1

## 2011-10-30 MED ORDER — PROMETHAZINE HCL 25 MG/ML IJ SOLN
50.0000 mg | Freq: Four times a day (QID) | INTRAMUSCULAR | Status: DC | PRN
  Administered 2011-10-31 – 2011-11-02 (×5): 50 mg via INTRAMUSCULAR
  Filled 2011-10-30: qty 1
  Filled 2011-10-30 (×5): qty 2

## 2011-10-30 MED ORDER — INSULIN ASPART 100 UNIT/ML ~~LOC~~ SOLN
0.0000 [IU] | Freq: Three times a day (TID) | SUBCUTANEOUS | Status: DC
  Administered 2011-10-31: 2 [IU] via SUBCUTANEOUS
  Administered 2011-10-31: 3 [IU] via SUBCUTANEOUS
  Administered 2011-10-31: 2 [IU] via SUBCUTANEOUS
  Administered 2011-11-01: 5 [IU] via SUBCUTANEOUS
  Administered 2011-11-02: 7 [IU] via SUBCUTANEOUS
  Administered 2011-11-02: 9 [IU] via SUBCUTANEOUS
  Filled 2011-10-30: qty 3

## 2011-10-30 MED ORDER — ACETAMINOPHEN 650 MG RE SUPP: 650.0000 mg | Freq: Four times a day (QID) | RECTAL | Status: DC | PRN

## 2011-10-30 MED ORDER — BRINZOLAMIDE 1 % OP SUSP
1.0000 [drp] | Freq: Three times a day (TID) | OPHTHALMIC | Status: DC
  Administered 2011-10-31 – 2011-11-02 (×6): 1 [drp] via OPHTHALMIC
  Filled 2011-10-30: qty 10

## 2011-10-30 MED ORDER — DEXTROSE 5 % IV SOLN
INTRAVENOUS | Status: AC
  Filled 2011-10-30: qty 1

## 2011-10-30 MED ORDER — HOMATROPINE HBR 5 % OP SOLN
1.0000 [drp] | Freq: Three times a day (TID) | OPHTHALMIC | Status: DC
  Filled 2011-10-30: qty 5

## 2011-10-30 MED ORDER — ARTIFICIAL TEARS OP OINT
TOPICAL_OINTMENT | OPHTHALMIC | Status: DC | PRN
  Administered 2011-11-01: 1 via OPHTHALMIC
  Administered 2011-11-02 (×2): via OPHTHALMIC
  Filled 2011-10-30: qty 3.5

## 2011-10-30 MED ORDER — BUTALBITAL-APAP-CAFFEINE 50-325-40 MG PO TABS: 1.0000 | ORAL_TABLET | Freq: Four times a day (QID) | ORAL | Status: DC | PRN

## 2011-10-30 MED ORDER — AMLODIPINE BESYLATE 5 MG PO TABS
10.0000 mg | ORAL_TABLET | Freq: Every day | ORAL | Status: DC
  Administered 2011-11-01 – 2011-11-02 (×2): 10 mg via ORAL
  Filled 2011-10-30 (×3): qty 2

## 2011-10-30 MED ORDER — VANCOMYCIN HCL IN DEXTROSE 1-5 GM/200ML-% IV SOLN
1000.0000 mg | Freq: Once | INTRAVENOUS | Status: AC
  Administered 2011-10-31: 1000 mg via INTRAVENOUS

## 2011-10-30 MED ORDER — METOCLOPRAMIDE HCL 10 MG PO TABS
10.0000 mg | ORAL_TABLET | Freq: Three times a day (TID) | ORAL | Status: DC
  Filled 2011-10-30: qty 1

## 2011-10-30 MED ORDER — OLOPATADINE HCL 0.1 % OP SOLN
1.0000 [drp] | Freq: Every day | OPHTHALMIC | Status: DC
  Administered 2011-11-01: 1 [drp] via OPHTHALMIC
  Filled 2011-10-30: qty 5

## 2011-10-30 MED ORDER — LISINOPRIL 10 MG PO TABS
40.0000 mg | ORAL_TABLET | Freq: Every day | ORAL | Status: DC
  Administered 2011-11-01 – 2011-11-02 (×2): 40 mg via ORAL
  Filled 2011-10-30: qty 4
  Filled 2011-10-30 (×2): qty 1
  Filled 2011-10-30 (×2): qty 4

## 2011-10-30 MED ORDER — SODIUM CHLORIDE 0.9 % IJ SOLN: 3.0000 mL | INTRAMUSCULAR | Status: DC | PRN

## 2011-10-30 NOTE — ED Notes (Signed)
Dressed Pt's 2 wounds on rt leg, lower wound approx 2 in diameter, upper wound same. Both have yellow exudate. Dressed with telfa and kling.

## 2011-10-30 NOTE — Progress Notes (Signed)
ANTIBIOTIC CONSULT NOTE - INITIAL  Pharmacy Consult for Vancomycin, Cefepime, Levaquin Indication: rule out pneumonia  Allergies  Allergen Reactions  . Compazine (Prochlorperazine) Swelling    Per echart records  . Contrast Media (Iodinated Diagnostic Agents)     Per echart records  . Meperidine Hcl Swelling  . Metformin And Related Other (See Comments)    'just not myself, was told not to take metformin'  . Morphine Swelling  . Nubain (Nalbuphine Hcl) Swelling  . Topiramate (Topamax) Swelling    Tongue swelling - per echart records  . Toradol (Ketorolac Tromethamine) Swelling    Patient Measurements: Height: 5\' 10"  (177.8 cm) Weight: 190 lb (86.183 kg) IBW/kg (Calculated) : 68.5   Vital Signs: Temp: 97.4 F (36.3 C) (03/05 1425) Temp src: Oral (03/05 1425) BP: 195/75 mmHg (03/05 1940) Pulse Rate: 101  (03/05 1940) Intake/Output from previous day:   Intake/Output from this shift:    Labs:  [facility] 10/30/11 1848  WBC 13.6*  HGB 11.2*  PLT 405*  LABCREA --  CREATININE 5.72*   Estimated Creatinine Clearance: 15.3 ml/min (by C-G formula based on Cr of 5.72). No results found for this basename: VANCOTROUGH:2,VANCOPEAK:2,VANCORANDOM:2,GENTTROUGH:2,GENTPEAK:2,GENTRANDOM:2,TOBRATROUGH:2,TOBRAPEAK:2,TOBRARND:2,AMIKACINPEAK:2,AMIKACINTROU:2,AMIKACIN:2, in the last 72 hours   Microbiology: No results found for this or any previous visit (from the past 720 hour(s)).  Medical History: Past Medical History  Diagnosis Date  . DVT (deep venous thrombosis)     DVT HISTORY  . Renal failure   . Diabetes mellitus   . Migraine     HISTORY  . Anxiety and depression   . CAD (coronary artery disease)   . Bipolar 1 disorder   . Nephrotic syndrome     IN THE PAST  . Hyperlipidemia   . Hyperparathyroidism   . PE (pulmonary embolism)     HISTORY  . Dialysis patient   . Anemia   . Hypertension     Medications:  Anti-infectives     Start     Dose/Rate Route Frequency  Ordered Stop   10/30/11 2200   vancomycin (VANCOCIN) IVPB 1000 mg/200 mL premix        1,000 mg 200 mL/hr over 60 Minutes Intravenous  Once 10/30/11 2117     10/30/11 2115   ceFEPIme (MAXIPIME) 1 g in dextrose 5 % 50 mL IVPB        1 g 100 mL/hr over 30 Minutes Intravenous  Once 10/30/11 2103     10/30/11 2115   Levofloxacin (LEVAQUIN) IVPB 750 mg        750 mg 100 mL/hr over 90 Minutes Intravenous  Once 10/30/11 2103     10/30/11 2100   vancomycin (VANCOCIN) IVPB 1000 mg/200 mL premix  Status:  Discontinued        1,000 mg 200 mL/hr over 60 Minutes Intravenous  Once 10/30/11 2055 10/30/11 2103         Assessment: Okay for Protocol Renal Failure  Goal of Therapy:  Vancomycin trough level 15-20 mcg/ml  Plan:  Measure antibiotic drug levels at steady state Follow up culture results Vancomycin 1gm IV x 1. Cefepime 1gm IV x 1 (ordered). Levaquin 750mg  IV x 1 (ordered). F/U AM labs and renal plan.  Re-dose antibiotics accordingly.  [name] 10/30/2011,9:18 PM

## 2011-10-30 NOTE — H&P (Signed)
PCP:   Ernestine Conrad, MD, MD   Chief Complaint:  Vomiting/abdominal pain/cough.  HPI: Poor historian, sedated due to pain meds, who is accompanied by her mother, who supplies the history. Patient comes in with vomiting and abdominal pain typical of gastroparesis. Vomited cola colored material. Admits to some fever. Missed dialysis due to not feeling well. Has had some diarrhea. In ED found to have possible PNA/uti, dirty left leg ulcer. Wbc 13000. Given vanc/levaquin/cefepime, and referred for admission  Review of Systems:  Unremarkable except as in HPI. Past Medical History: Past Medical History  Diagnosis Date  . DVT (deep venous thrombosis)     DVT HISTORY  . Renal failure   . Diabetes mellitus   . Migraine     HISTORY  . Anxiety and depression   . CAD (coronary artery disease)   . Bipolar 1 disorder   . Nephrotic syndrome     IN THE PAST  . Hyperlipidemia   . Hyperparathyroidism   . PE (pulmonary embolism)     HISTORY  . Dialysis patient   . Anemia   . Hypertension    Past Surgical History  Procedure Date  . Back surgery X 4  . Cholecystectomy   . Incise and drain abcess     OF THIGHS FROM INSULIN INJECTIONS  . Heart stents     3 - 4  STENTS PLACED  . Portacath placement 2003  . Eye surgery     Medications: Prior to Admission medications   Medication Sig Start Date End Date Taking? Authorizing Provider  brimonidine (ALPHAGAN) 0.15 % ophthalmic solution Place 1 drop into the right eye 2 (two) times daily. **To be used with Timolol 0.5% OPH Solution**   Yes Historical Provider, MD  brinzolamide (AZOPT) 1 % ophthalmic suspension Place 1 drop into the right eye 3 (three) times daily.   Yes Historical Provider, MD  ketorolac (ACULAR) 0.5 % ophthalmic solution Place 1 drop into the right eye 3 (three) times daily. For 7 days   Yes Historical Provider, MD  moxifloxacin (VIGAMOX) 0.5 % ophthalmic solution Place 1 drop into the right eye 4 (four) times daily.   Yes  Historical Provider, MD  Olopatadine HCl (PATADAY) 0.2 % SOLN Apply 1 drop to eye daily.   Yes Historical Provider, MD  oxyCODONE-acetaminophen (PERCOCET) 10-325 MG per tablet Take 1 tablet by mouth every 4 (four) hours. For pain   Yes Historical Provider, MD  travoprost, benzalkonium, (TRAVATAN) 0.004 % ophthalmic solution Place 1 drop into the right eye at bedtime.   Yes Historical Provider, MD  albuterol (PROVENTIL HFA;VENTOLIN HFA) 108 (90 BASE) MCG/ACT inhaler Inhale 2 puffs into the lungs every 6 (six) hours as needed. Shortness of breath      Historical Provider, MD  ALPRAZolam (XANAX) 1 MG tablet Take 1 tablet (1 mg total) by mouth 4 (four) times daily as needed for anxiety. For anxiety  08/23/11   Wilson Singer, MD  amLODipine (NORVASC) 10 MG tablet Take 10 mg by mouth daily.      Historical Provider, MD  ARIPiprazole (ABILIFY) 2 MG tablet Take 2 mg by mouth daily.      Historical Provider, MD  Artificial Tear Ointment (REFRESH P.M. OP) Place 1 application into the right eye 4 (four) times daily.     Historical Provider, MD  atropine 1 % ophthalmic solution Place 1 drop into the right eye 4 (four) times daily.      Historical Provider, MD  b complex-vitamin c-folic  acid (NEPHRO-VITE) 0.8 MG TABS Take 0.8 mg by mouth at bedtime.      Historical Provider, MD  brimonidine-timolol (COMBIGAN) 0.2-0.5 % ophthalmic solution Place 1 drop into the right eye every 12 (twelve) hours.    Historical Provider, MD  butalbital-acetaminophen-caffeine (FIORICET, ESGIC) 50-325-40 MG per tablet Take 1 tablet by mouth 4 (four) times daily as needed. **Take one tablet up to 4 times daily as needed for acute migraine pain    Historical Provider, MD  cinacalcet (SENSIPAR) 30 MG tablet Take 30 mg by mouth daily. **Take one tablet daily with evening meal**    Historical Provider, MD  cloNIDine (CATAPRES) 0.2 MG tablet Take 0.2 mg by mouth 2 (two) times daily.     Historical Provider, MD  clopidogrel (PLAVIX) 75  MG tablet Take 75 mg by mouth daily.      Historical Provider, MD  Eyelid Cleansers (SYSTANE LID WIPES) PADS Place 1 application into the right eye daily.    Historical Provider, MD  homatropine 5 % ophthalmic solution Place 1 drop into the right eye 3 (three) times daily.      Historical Provider, MD  insulin glargine (LANTUS) 100 UNIT/ML injection Inject 30 Units into the skin at bedtime. 08/23/11   Nimish Normajean Glasgow, MD  insulin lispro (HUMALOG) 100 UNIT/ML injection Inject 7-15 Units into the skin 4 (four) times daily -  before meals and at bedtime. TAKING ON SLIDING SCALE 200-249=7 units 250-299=10 units 300-349=12 units >350=15 units    Historical Provider, MD  lisinopril (PRINIVIL,ZESTRIL) 40 MG tablet Take 40 mg by mouth daily.      Historical Provider, MD  loteprednol (LOTEMAX) 0.5 % ophthalmic suspension Place 1 drop into the right eye 4 (four) times daily.    Historical Provider, MD  metoCLOPramide (REGLAN) 10 MG tablet TAKE (2) TABLETS FOUR TIMES DAILY. 10/03/11   Malissa Hippo, MD  ofloxacin (OCUFLOX) 0.3 % ophthalmic solution Place 1 drop into the right eye 3 (three) times daily.     Historical Provider, MD  omeprazole (PRILOSEC) 40 MG capsule Take 1 capsule (40 mg total) by mouth daily before breakfast. Take (1) Capsule Twice Daily 30 Minutes Before Breakfast and 30 Minutes Before Supper 06/26/11   Malissa Hippo, MD  PARoxetine (PAXIL) 40 MG tablet Take 80 mg by mouth every morning.     Historical Provider, MD  Polyethyl Glycol-Propyl Glycol (SYSTANE ULTRA) 0.4-0.3 % SOLN Place 1 drop into both eyes 4 (four) times daily.    Historical Provider, MD  promethazine (PHENERGAN) 25 MG tablet TAKE ONE TABLET EVERY SIX HOURS AS NEEDED 10/03/11   Malissa Hippo, MD  promethazine (PHENERGAN) 50 MG/ML injection Inject 50 mg into the muscle every 6 (six) hours as needed. For nausea    Historical Provider, MD  sevelamer (RENVELA) 800 MG tablet Take 1,600-4,000 mg by mouth 5 (five) times daily.  *Take 5 tablets daily with each meal and take 2 tablets with snacks**    Historical Provider, MD  sodium bicarbonate 650 MG tablet Take 650 mg by mouth 2 (two) times daily.      Historical Provider, MD  sucralfate (CARAFATE) 1 G tablet Take 1 g by mouth 2 (two) times daily before a meal.     Historical Provider, MD  torsemide (DEMADEX) 100 MG tablet Take 100 mg by mouth daily.      Historical Provider, MD    Allergies:   Allergies  Allergen Reactions  . Compazine (Prochlorperazine) Swelling  Per echart records  . Contrast Media (Iodinated Diagnostic Agents)     Per echart records  . Meperidine Hcl Swelling  . Metformin And Related Other (See Comments)    'just not myself, was told not to take metformin'  . Morphine Swelling  . Nubain (Nalbuphine Hcl) Swelling  . Topiramate (Topamax) Swelling    Tongue swelling - per echart records  . Toradol (Ketorolac Tromethamine) Swelling    Social History:  reports that she has been smoking Cigarettes.  She has a 40 pack-year smoking history. She has never used smokeless tobacco. She reports that she does not drink alcohol or use illicit drugs.   Family History: DM2.  Physical Exam: Filed Vitals:   10/30/11 1830 10/30/11 1939 10/30/11 1940 10/30/11 2253  BP: 198/83 192/68 195/75 159/64  Pulse: 94 99 101 97  Temp:    98.1 F (36.7 C)  TempSrc:    Oral  Resp: 20   18  Height:      Weight:      SpO2: 93%   94%   Ill looking. Sedated after pain meds. Bilateral air entry. Reduced both sides. No wheezing. S1S2 heard. No murmurs. +BS. CNS- responds to questions appropriately. Extremities- deep ulcer tibia left leg. Erythema around ulcer. Reduced dorsalis pedis pulse. Foot cold to touch.   Labs on Admission:   [facility] 10/30/11 1848  NA 128*  K 4.3  CL 85*  CO2 26  GLUCOSE 262*  BUN 53*  CREATININE 5.72*  CALCIUM 10.4  MG --  PHOS --    Basename 10/30/11 1848  AST 10  ALT 12  ALKPHOS 224*  BILITOT 0.1*  PROT 7.4    ALBUMIN 2.7*    Basename 10/30/11 1848  LIPASE 20  AMYLASE --    Basename 10/30/11 1848  WBC 13.6*  NEUTROABS 12.5*  HGB 11.2*  HCT 33.1*  MCV 97.9  PLT 405*   No results found for this basename: CKTOTAL:3,CKMB:3,CKMBINDEX:3,TROPONINI:3 in the last 72 hours No results found for this basename: TSH,T4TOTAL,FREET3,T3FREE,THYROIDAB in the last 72 hours No results found for this basename: VITAMINB12:2,FOLATE:2,FERRITIN:2,TIBC:2,IRON:2,RETICCTPCT:2 in the last 72 hours  Radiological Exams on Admission: Dg Tibia/fibula Right  10/30/2011  *RADIOLOGY REPORT*  Clinical Data: Soft tissue ulcerations and pain in the right lower leg.  RIGHT TIBIA AND FIBULA - 2 VIEW  Comparison: 10/06/2011  Findings: The tibia and fibula appear normal with no periosteal reaction or other significant abnormality.  Soft tissue ulceration of the distal lateral aspect of the right lower leg is more prominent than on the prior exam.  IMPRESSION: No evidence of osteomyelitis or other acute osseous abnormality.  Original Report Authenticated By: Gwynn Burly, M.D.   Dg Tibia/fibula Right  10/06/2011  *RADIOLOGY REPORT*  Clinical Data: Distal wound check  RIGHT TIBIA AND FIBULA - 2 VIEW  Comparison: None.  Findings: Two views of the right tibia-fibula submitted.  No acute fracture or subluxation.  There is soft tissue irregularity lateral distal fibular region.  Clinical correlation is necessary.  No periosteal reaction or bony erosion.  IMPRESSION: No acute fracture or subluxation.  No periosteal reaction or bony erosion to suggest osteomyelitis.  Original Report Authenticated By: Natasha Mead, M.D.   Dg Abd Acute W/chest  10/30/2011  *RADIOLOGY REPORT*  Clinical Data: Abdominal pain.  Vomiting.  ACUTE ABDOMEN SERIES (ABDOMEN 2 VIEW & CHEST 1 VIEW)  Comparison: None.  Findings: There is cardiomegaly with a Port-A-Cath in place. There is a small right pleural effusion.  Faint  patchy infiltrate or parenchymal scarring at the  left lung base laterally.  No free air or free fluid in the abdomen.  The abdomen is essentially devoid of bowel gas.  No dilated loops of bowel. Harrington rods in the lower lumbar spine.  Extensive vascular calcification.  IMPRESSION:  1.  Small right pleural effusion. 2.  Small area of infiltrate or parenchymal scarring at the left lung base. 3.  Cardiomegaly. 4.  No acute abnormalities of the abdomen.  Original Report Authenticated By: Gwynn Burly, M.D.   Dg Foot Complete Right  10/30/2011  *RADIOLOGY REPORT*  Clinical Data: .  Right lower leg and right foot ulcerations. Right lower leg pain.  RIGHT FOOT COMPLETE - 3+ VIEW  Comparison: 10/06/2011  Findings: There is no evidence of osteomyelitis or significant arthritis. No visible joint effusions or bone destruction.  IMPRESSION: No significant abnormalities.  No change.  Original Report Authenticated By: Gwynn Burly, M.D.   Dg Foot Complete Right  10/06/2011  *RADIOLOGY REPORT*  Clinical Data: wound check  RIGHT FOOT COMPLETE - 3+ VIEW  Comparison: 02/04/2010  Findings: Three views of the right foot submitted.  No acute fracture or subluxation.  No periosteal reaction or bony erosion.  IMPRESSION: No acute fracture or subluxation.  No definite evidence of osteomyelitis.  Original Report Authenticated By: Natasha Mead, M.D.    Assessment Multiple complaints in diabetic lady with ESRD on HD, pvd with chronic leg ulcers, gastroparesis, continues to smoke. Work up suggests infection- PNA/superinfection of leg ulcers. Gastroparesis/uti seems responsible for vomiting. May have aspirated, resulting in aspiration pneumonia. Supposed to follow [facility] for angiogram on march 12th. Plan .Vomiting- likely due to gastroparesis. Antiemetics. .ESRD on dialysis- missed hd today. Consult renal for hd in am. .HTN (hypertension), malignant- uncontrolled. Resume home meds. .DM (diabetes mellitus), type 2 with complications- check hba1c. Lantus/aspart for  now. Marland KitchenUlcers of both lower extremities- possibly infected. Related to ischemia/dm. Marland KitchenPNA (pneumonia)- aspiration versus other. Vanc/zosyn. Marland KitchenPVD (peripheral vascular disease)- plavix. Follow [facility] as planned for angiogram. Smoking cessation counseling given. Condition closely guarded. Discussed plan of care with mother at bedside. Marque Bango [phone]. 10/30/2011, 11:43 PM

## 2011-10-30 NOTE — ED Notes (Signed)
Patient refused to stand for orthostatic vital signs

## 2011-10-30 NOTE — ED Notes (Signed)
Vomiting, abd pain, headache.dizzy , weak

## 2011-10-30 NOTE — ED Provider Notes (Signed)
History     CSN: [id number]  Arrival date & time 10/30/11  1407   First MD Initiated Contact with Patient 10/30/11 1654      Chief Complaint  Patient presents with  . Emesis    HPI Pt was seen at 1720.  Per pt and her mother, c/o gradual onset and persistence of multiple intermittent episodes of N/V since last night.  Has been assoc with generalized abd "pain."  Describes her symptoms as "my gastroparesis is acting up."  Pt did not go to her usual HD today due to feeling "too weak to go."  Pt has long hx of same, with multiple previous ED evals.  Denies fevers, no diarrhea, no back pain, no CP/SOB, no black or blood in emesis.    Past Medical History  Diagnosis Date  . DVT (deep venous thrombosis)     DVT HISTORY  . Renal failure   . Diabetes mellitus   . Migraine     HISTORY  . Anxiety and depression   . CAD (coronary artery disease)   . Bipolar 1 disorder   . Nephrotic syndrome     IN THE PAST  . Hyperlipidemia   . Hyperparathyroidism   . PE (pulmonary embolism)     HISTORY  . Dialysis patient   . Anemia   . Hypertension     Past Surgical History  Procedure Date  . Back surgery X 4  . Cholecystectomy   . Incise and drain abcess     OF THIGHS FROM INSULIN INJECTIONS  . Heart stents     3 - 4  STENTS PLACED  . Portacath placement 2003  . Eye surgery     History  Substance Use Topics  . Smoking status: Current Everyday Smoker -- 2.0 packs/day for 20 years    Types: Cigarettes  . Smokeless tobacco: Never Used  . Alcohol Use: No    Review of Systems ROS: Statement: All systems negative except as marked or noted in the HPI; Constitutional: Negative for fever and chills. ; ; Eyes: Negative for eye pain, redness and discharge. ; ; ENMT: Negative for ear pain, hoarseness, nasal congestion, sinus pressure and sore throat. ; ; Cardiovascular: Negative for chest pain, palpitations, diaphoresis, dyspnea and peripheral edema. ; ; Respiratory: Negative for cough,  wheezing and stridor. ; ; Gastrointestinal: +N/V, abd pain. Negative for  diarrhea, blood in stool, hematemesis, jaundice and rectal bleeding. . ; ; Genitourinary: Negative for dysuria, flank pain and hematuria. ; ; Musculoskeletal: Negative for back pain and neck pain. Negative for swelling and trauma.; ; Skin: +right leg ulcers. Negative for pruritus, rash, abrasions, blisters, bruising.; ; Neuro: +weakness.  Negative for headache, lightheadedness and neck stiffness. Negative for altered level of consciousness , altered mental status, extremity weakness, paresthesias, involuntary movement, seizure and syncope.     Allergies  Compazine; Contrast media; Meperidine hcl; Metformin and related; Morphine; Nubain; Topiramate; and Toradol  Home Medications   Current Outpatient Rx  Name Route Sig Dispense Refill  . OXYCODONE-ACETAMINOPHEN 10-325 MG PO TABS Oral Take 1 tablet by mouth every 4 (four) hours. For pain    . ALBUTEROL SULFATE HFA 108 (90 BASE) MCG/ACT IN AERS Inhalation Inhale 2 puffs into the lungs every 6 (six) hours as needed. Shortness of breath      . ALPRAZOLAM 1 MG PO TABS Oral Take 1 tablet (1 mg total) by mouth 4 (four) times daily as needed for anxiety. For anxiety  30 tablet 0  .  AMLODIPINE BESYLATE 10 MG PO TABS Oral Take 10 mg by mouth daily.      . ARIPIPRAZOLE 2 MG PO TABS Oral Take 2 mg by mouth daily.      Marland Kitchen REFRESH P.M. OP Right Eye Place 1 application into the right eye 5 (five) times daily.    . ATROPINE SULFATE 1 % OP SOLN Right Eye Place 1 drop into the right eye 4 (four) times daily.      Marland Kitchen NEPHRO-VITE 0.8 MG PO TABS Oral Take 0.8 mg by mouth at bedtime.      Marland Kitchen BRIMONIDINE TARTRATE-TIMOLOL 0.2-0.5 % OP SOLN Right Eye Place 1 drop into the right eye every 12 (twelve) hours.    Marland Kitchen BUTALBITAL-APAP-CAFFEINE 50-325-40 MG PO TABS Oral Take 1 tablet by mouth every 6 (six) hours as needed. For pain    . CINACALCET HCL 30 MG PO TABS Oral Take 30 mg by mouth daily.      Marland Kitchen  CLONIDINE HCL 0.2 MG PO TABS Oral Take 0.2 mg by mouth 2 (two) times daily.     Marland Kitchen CLOPIDOGREL BISULFATE 75 MG PO TABS Oral Take 75 mg by mouth daily.      Frazier Butt LID WIPES EX PADS Right Eye Place 1 application into the right eye daily.    Marland Kitchen HOMATROPINE HBR 5 % OP SOLN Right Eye Place 1 drop into the right eye 3 (three) times daily.      . INSULIN GLARGINE 100 UNIT/ML [place] SOLN Subcutaneous Inject 30 Units into the skin at bedtime.    . INSULIN LISPRO (HUMAN) 100 UNIT/ML  SOLN Subcutaneous Inject 7-15 Units into the skin 4 (four) times daily -  before meals and at bedtime. TAKING ON SLIDING SCALE 200-249=7 units 250-299=10 units 300-349=12 units >350=15 units    . LISINOPRIL 40 MG PO TABS Oral Take 40 mg by mouth daily.      Marland Kitchen LOTEPREDNOL ETABONATE 0.2 % OP SUSP Right Eye Place 1 drop into the right eye 4 (four) times daily.      Marland Kitchen LOTEPREDNOL ETABONATE 0.5 % OP SUSP Right Eye Place 1 drop into the right eye 4 (four) times daily.    Marland Kitchen METOCLOPRAMIDE HCL 10 MG PO TABS  TAKE (2) TABLETS FOUR TIMES DAILY. 240 tablet 1  . OFLOXACIN 0.3 % OP SOLN Right Eye Place 1 drop into the right eye 4 (four) times daily.    Marland Kitchen OMEPRAZOLE 40 MG PO CPDR Oral Take 1 capsule (40 mg total) by mouth daily before breakfast. Take (1) Capsule Twice Daily 30 Minutes Before Breakfast and 30 Minutes Before Supper 30 capsule 5  . PAROXETINE HCL 40 MG PO TABS Oral Take 80 mg by mouth every morning.     Marland Kitchen POLYETHYL GLYCOL-PROPYL GLYCOL 0.4-0.3 % OP SOLN Both Eyes Place 1 drop into both eyes 4 (four) times daily.    Marland Kitchen PROMETHAZINE HCL 25 MG PO TABS  TAKE ONE TABLET EVERY SIX HOURS AS NEEDED 120 tablet 0  . PROMETHAZINE HCL 50 MG/ML IJ SOLN Intramuscular Inject 50 mg into the muscle every 6 (six) hours as needed. For nausea    . SEVELAMER CARBONATE 800 MG PO TABS Oral Take 4,000 mg by mouth 3 (three) times daily with meals.     . SODIUM BICARBONATE 650 MG PO TABS Oral Take 650 mg by mouth 2 (two) times daily.      . SUCRALFATE  1 G PO TABS Oral Take 1 g by mouth 2 (two) times  daily before a meal.     . TORSEMIDE 100 MG PO TABS Oral Take 100 mg by mouth daily.        BP 221/96  Pulse 103  Temp(Src) 97.4 F (36.3 C) (Oral)  Resp 20  Ht 5\' 10"  (1.778 m)  Wt 190 lb (86.183 kg)  BMI 27.26 kg/m2  SpO2 97%  Physical Exam 1725: Physical examination:  Nursing notes reviewed; Vital signs and O2 SAT reviewed;  Constitutional: Well developed, Well nourished, In no acute distress; Head:  Normocephalic, atraumatic; Eyes: EOMI, PERRL, No scleral icterus; ENMT: Mouth and pharynx normal, Mucous membranes dry; Neck: Supple, Full range of motion, No lymphadenopathy; Cardiovascular: Regular rate and rhythm, No murmur, rub, or gallop; Respiratory: Breath sounds clear & equal bilaterally, No rales, rhonchi, wheezes, or rub, Normal respiratory effort/excursion; Chest: Nontender, Movement normal; Abdomen: Soft, +diffuse tenderness to palp, Nondistended, Normal bowel sounds; Genitourinary: No CVA tenderness; Extremities: +right lateral distal tibial area with approx 4x4cm diameter area of ulceration with purulent drainage and foul odor, +right dorsal foot erythematous and warm, +right lateral proximal tibial area with approx 3x3cm diameter area of ulceration with crusted purulent drainage.  Pedal pulses non-palpable per hx with edematous feet bilat, No tenderness, No calf edema or asymmetry.; Neuro: AA&Ox3, Major CN grossly intact.  No gross focal motor or sensory deficits in extremities.; Skin: Color normal, Warm, Dry.    ED Course  Procedures   1725:  Pt was sleeping soundly on the stretcher when I walked into her exam room.  I spoke with her mother for several minutes until she awoke to me calling her name.  States she was "up all night last night vomiting" and "is really tired" and "in excruciating pain."  Requesting ED RN access her port-a-cath for labs, IVF, meds, etc.  9:05 PM:  Pt continues to c/o abd pain and nausea, requesting  "more meds."  Refuses to stand for orthostatic VS.  Pt apparently left the ED AMA last month after being evaluated for RLE ulcers.  Endorses she was tx with PO abx.  EPIC chart review reveals pt has hx of decreased ABI's and palp pedal pulses bilat LE's per [facility] note, and is scheduled on 11/09/11 for an aortogram to further eval.  H/H per pt's baseline today, Na/Cl lower that baseline.  Known ESRD on HD, question if true UTI as pt is afebrile today, UC is pending.  Possible HCAP on CXR, will tx with abx (which will also cover for skin infection and UTI).  T/C to [facility] Dr. Venetia Constable, case discussed, including:  HPI, pertinent PM/SHx, VS/PE, dx testing, ED course and treatment:  Agreeable to admit, requests to obtain medical bed to team 2/Dr. Kerry Hough.    MDM  MDM Reviewed: nursing note, vitals and previous chart Reviewed previous: labs Interpretation: labs and x-ray   Results for orders placed during the hospital encounter of 10/30/11  CBC      Component Value Range   WBC 13.6 (*) 4.0 - 10.5 (K/uL)   RBC 3.38 (*) 3.87 - 5.11 (MIL/uL)   Hemoglobin 11.2 (*) 12.0 - 15.0 (g/dL)   HCT 16.1 (*) 09.6 - 46.0 (%)   MCV 97.9  78.0 - 100.0 (fL)   MCH 33.1  26.0 - 34.0 (pg)   MCHC 33.8  30.0 - 36.0 (g/dL)   RDW 04.5 (*) 40.9 - 15.5 (%)   Platelets 405 (*) 150 - 400 (K/uL)  DIFFERENTIAL      Component Value Range  Neutrophils Relative 92 (*) 43 - 77 (%)   Neutro Abs 12.5 (*) 1.7 - 7.7 (K/uL)   Lymphocytes Relative 6 (*) 12 - 46 (%)   Lymphs Abs 0.8  0.7 - 4.0 (K/uL)   Monocytes Relative 2 (*) 3 - 12 (%)   Monocytes Absolute 0.3  0.1 - 1.0 (K/uL)   Eosinophils Relative 0  0 - 5 (%)   Eosinophils Absolute 0.0  0.0 - 0.7 (K/uL)   Basophils Relative 0  0 - 1 (%)   Basophils Absolute 0.1  0.0 - 0.1 (K/uL)  URINALYSIS, ROUTINE W REFLEX MICROSCOPIC      Component Value Range   Color, Urine YELLOW  YELLOW    APPearance HAZY (*) CLEAR    Specific Gravity, Urine 1.020  1.005 - 1.030     pH 5.0  5.0 - 8.0    Glucose, UA 250 (*) NEGATIVE (mg/dL)   Hgb urine dipstick SMALL (*) NEGATIVE    Bilirubin Urine NEGATIVE  NEGATIVE    Ketones, ur NEGATIVE  NEGATIVE (mg/dL)   Protein, ur 161 (*) NEGATIVE (mg/dL)   Urobilinogen, UA 0.2  0.0 - 1.0 (mg/dL)   Nitrite NEGATIVE  NEGATIVE    Leukocytes, UA MODERATE (*) NEGATIVE   LIPASE, BLOOD      Component Value Range   Lipase 20  11 - 59 (U/L)  COMPREHENSIVE METABOLIC PANEL      Component Value Range   Sodium 128 (*) 135 - 145 (mEq/L)   Potassium 4.3  3.5 - 5.1 (mEq/L)   Chloride 85 (*) 96 - 112 (mEq/L)   CO2 26  19 - 32 (mEq/L)   Glucose, Bld 262 (*) 70 - 99 (mg/dL)   BUN 53 (*) 6 - 23 (mg/dL)   Creatinine, Ser 0.96 (*) 0.50 - 1.10 (mg/dL)   Calcium 04.5  8.4 - 10.5 (mg/dL)   Total Protein 7.4  6.0 - 8.3 (g/dL)   Albumin 2.7 (*) 3.5 - 5.2 (g/dL)   AST 10  0 - 37 (U/L)   ALT 12  0 - 35 (U/L)   Alkaline Phosphatase 224 (*) 39 - 117 (U/L)   Total Bilirubin 0.1 (*) 0.3 - 1.2 (mg/dL)   GFR calc non Af Amer 8 (*) >90 (mL/min)   GFR calc Af Amer 10 (*) >90 (mL/min)  URINE MICROSCOPIC-ADD ON      Component Value Range   Squamous Epithelial / LPF MANY (*) RARE    WBC, UA TOO NUMEROUS TO COUNT  <3 (WBC/hpf)   RBC / HPF 0-2  <3 (RBC/hpf)   Bacteria, UA MANY (*) RARE    Urine-Other YEAST      Dg Abd Acute W/chest 10/30/2011  *RADIOLOGY REPORT*  Clinical Data: Abdominal pain.  Vomiting.  ACUTE ABDOMEN SERIES (ABDOMEN 2 VIEW & CHEST 1 VIEW)  Comparison: None.  Findings: There is cardiomegaly with a Port-A-Cath in place. There is a small right pleural effusion.  Faint patchy infiltrate or parenchymal scarring at the left lung base laterally.  No free air or free fluid in the abdomen.  The abdomen is essentially devoid of bowel gas.  No dilated loops of bowel. Harrington rods in the lower lumbar spine.  Extensive vascular calcification.  IMPRESSION:  1.  Small right pleural effusion. 2.  Small area of infiltrate or parenchymal scarring at the  left lung base. 3.  Cardiomegaly. 4.  No acute abnormalities of the abdomen.  Original Report Authenticated By: Gwynn Burly, M.D.  Laray Anger, DO 11/01/11 1538

## 2011-10-31 ENCOUNTER — Other Ambulatory Visit: Payer: Self-pay

## 2011-10-31 ENCOUNTER — Encounter (HOSPITAL_COMMUNITY): Payer: Self-pay | Admitting: Pharmacy Technician

## 2011-10-31 DIAGNOSIS — F319 Bipolar disorder, unspecified: Secondary | ICD-10-CM | POA: Diagnosis present

## 2011-10-31 DIAGNOSIS — E11622 Type 2 diabetes mellitus with other skin ulcer: Secondary | ICD-10-CM | POA: Diagnosis present

## 2011-10-31 DIAGNOSIS — N390 Urinary tract infection, site not specified: Secondary | ICD-10-CM | POA: Diagnosis present

## 2011-10-31 DIAGNOSIS — E871 Hypo-osmolality and hyponatremia: Secondary | ICD-10-CM | POA: Diagnosis present

## 2011-10-31 DIAGNOSIS — D649 Anemia, unspecified: Secondary | ICD-10-CM | POA: Diagnosis present

## 2011-10-31 DIAGNOSIS — Z720 Tobacco use: Secondary | ICD-10-CM | POA: Diagnosis present

## 2011-10-31 DIAGNOSIS — N2581 Secondary hyperparathyroidism of renal origin: Secondary | ICD-10-CM | POA: Diagnosis present

## 2011-10-31 LAB — HEMOGLOBIN A1C
Hgb A1c MFr Bld: 7 % — ABNORMAL HIGH (ref <5.7)
Mean Plasma Glucose: 154 mg/dL — ABNORMAL HIGH (ref <117)

## 2011-10-31 LAB — GLUCOSE, CAPILLARY
Glucose-Capillary: 155 mg/dL — ABNORMAL HIGH (ref 70–99)
Glucose-Capillary: 183 mg/dL — ABNORMAL HIGH (ref 70–99)

## 2011-10-31 LAB — COMPREHENSIVE METABOLIC PANEL
ALT: 10 U/L (ref 0–35)
Alkaline Phosphatase: 176 U/L — ABNORMAL HIGH (ref 39–117)
BUN: 57 mg/dL — ABNORMAL HIGH (ref 6–23)
Chloride: 85 mEq/L — ABNORMAL LOW (ref 96–112)
GFR calc Af Amer: 9 mL/min — ABNORMAL LOW (ref >90)
Glucose, Bld: 199 mg/dL — ABNORMAL HIGH (ref 70–99)
Potassium: 3.9 mEq/L (ref 3.5–5.1)
Sodium: 128 mEq/L — ABNORMAL LOW (ref 135–145)
Total Bilirubin: 0.1 mg/dL — ABNORMAL LOW (ref 0.3–1.2)
Total Protein: 6.2 g/dL (ref 6.0–8.3)

## 2011-10-31 LAB — LIPID PANEL
HDL: 42 mg/dL (ref >39)
LDL Cholesterol: 63 mg/dL (ref 0–99)
Total CHOL/HDL Ratio: 3.5 RATIO
Triglycerides: 203 mg/dL — ABNORMAL HIGH (ref <150)

## 2011-10-31 LAB — TSH: TSH: 1.046 u[IU]/mL (ref 0.350–4.500)

## 2011-10-31 LAB — CBC
HCT: 27.2 % — ABNORMAL LOW (ref 36.0–46.0)
Hemoglobin: 9.1 g/dL — ABNORMAL LOW (ref 12.0–15.0)
MCH: 33 pg (ref 26.0–34.0)
MCHC: 33.5 g/dL (ref 30.0–36.0)
RDW: 15.9 % — ABNORMAL HIGH (ref 11.5–15.5)

## 2011-10-31 LAB — PROTIME-INR: INR: 1.18 (ref 0.00–1.49)

## 2011-10-31 MED ORDER — VANCOMYCIN HCL IN DEXTROSE 1-5 GM/200ML-% IV SOLN
INTRAVENOUS | Status: AC
  Filled 2011-10-31: qty 200

## 2011-10-31 MED ORDER — FLUCONAZOLE 100MG IVPB
100.0000 mg | INTRAVENOUS | Status: DC
  Administered 2011-10-31 – 2011-11-01 (×2): 100 mg via INTRAVENOUS
  Filled 2011-10-31 (×4): qty 50

## 2011-10-31 MED ORDER — SODIUM CHLORIDE 0.9 % IJ SOLN
INTRAMUSCULAR | Status: AC
  Administered 2011-10-31: 10 mL
  Filled 2011-10-31: qty 3

## 2011-10-31 MED ORDER — PIPERACILLIN-TAZOBACTAM 3.375 G IVPB
INTRAVENOUS | Status: AC
  Filled 2011-10-31: qty 50

## 2011-10-31 MED ORDER — TIMOLOL MALEATE 0.5 % OP SOLN
1.0000 [drp] | Freq: Two times a day (BID) | OPHTHALMIC | Status: DC
  Administered 2011-11-01 – 2011-11-02 (×4): 1 [drp] via OPHTHALMIC
  Filled 2011-10-31 (×2): qty 5

## 2011-10-31 MED ORDER — SEVELAMER CARBONATE 800 MG PO TABS: 1600.0000 mg | ORAL_TABLET | ORAL | Status: DC | PRN

## 2011-10-31 MED ORDER — BRIMONIDINE TARTRATE 0.2 % OP SOLN
1.0000 [drp] | Freq: Two times a day (BID) | OPHTHALMIC | Status: DC
  Administered 2011-11-01 – 2011-11-02 (×3): 1 [drp] via OPHTHALMIC
  Filled 2011-10-31: qty 5

## 2011-10-31 MED ORDER — GATIFLOXACIN 0.5 % OP SOLN
OPHTHALMIC | Status: AC
  Filled 2011-10-31: qty 2.5

## 2011-10-31 MED ORDER — MUPIROCIN 2 % EX OINT
1.0000 "application " | TOPICAL_OINTMENT | Freq: Two times a day (BID) | CUTANEOUS | Status: DC
  Administered 2011-10-31 – 2011-11-03 (×4): 1 via NASAL
  Filled 2011-10-31: qty 22

## 2011-10-31 MED ORDER — LEVOFLOXACIN IN D5W 750 MG/150ML IV SOLN
INTRAVENOUS | Status: AC
  Filled 2011-10-31: qty 150

## 2011-10-31 MED ORDER — OXYCODONE HCL 5 MG PO TABS
5.0000 mg | ORAL_TABLET | ORAL | Status: DC | PRN
  Administered 2011-10-31 – 2011-11-01 (×3): 5 mg via ORAL
  Filled 2011-10-31 (×3): qty 1

## 2011-10-31 MED ORDER — SEVELAMER CARBONATE 800 MG PO TABS
4000.0000 mg | ORAL_TABLET | Freq: Three times a day (TID) | ORAL | Status: DC
  Administered 2011-10-31 – 2011-11-03 (×6): 4000 mg via ORAL
  Filled 2011-10-31 (×7): qty 5

## 2011-10-31 MED ORDER — CHLORHEXIDINE GLUCONATE CLOTH 2 % EX PADS
6.0000 | MEDICATED_PAD | Freq: Every day | CUTANEOUS | Status: DC
  Administered 2011-11-01 – 2011-11-02 (×2): 6 via TOPICAL

## 2011-10-31 MED ORDER — FLUCONAZOLE IN SODIUM CHLORIDE 200-0.9 MG/100ML-% IV SOLN
INTRAVENOUS | Status: AC
  Filled 2011-10-31: qty 100

## 2011-10-31 MED ORDER — METOCLOPRAMIDE HCL 5 MG/ML IJ SOLN
10.0000 mg | Freq: Four times a day (QID) | INTRAMUSCULAR | Status: DC
  Administered 2011-10-31 – 2011-11-02 (×9): 10 mg via INTRAVENOUS
  Filled 2011-10-31 (×9): qty 2

## 2011-10-31 MED ORDER — OXYCODONE-ACETAMINOPHEN 5-325 MG PO TABS
1.0000 | ORAL_TABLET | ORAL | Status: DC | PRN
  Administered 2011-10-31 – 2011-11-03 (×9): 1 via ORAL
  Filled 2011-10-31 (×9): qty 1

## 2011-10-31 MED ORDER — PIPERACILLIN-TAZOBACTAM IN DEX 2-0.25 GM/50ML IV SOLN
2.2500 g | Freq: Three times a day (TID) | INTRAVENOUS | Status: DC
  Administered 2011-10-31 – 2011-11-01 (×3): 2.25 g via INTRAVENOUS
  Filled 2011-10-31 (×5): qty 50

## 2011-10-31 MED ORDER — POTASSIUM CHLORIDE IN NACL 40-0.9 MEQ/L-% IV SOLN
INTRAVENOUS | Status: AC
  Filled 2011-10-31: qty 1000

## 2011-10-31 MED ORDER — FLUCONAZOLE IN SODIUM CHLORIDE 200-0.9 MG/100ML-% IV SOLN
200.0000 mg | Freq: Once | INTRAVENOUS | Status: AC
  Administered 2011-10-31: 200 mg via INTRAVENOUS
  Filled 2011-10-31: qty 100

## 2011-10-31 MED ORDER — PIPERACILLIN-TAZOBACTAM 3.375 G IVPB
3.3750 g | Freq: Once | INTRAVENOUS | Status: AC
  Administered 2011-10-31: 3.375 g via INTRAVENOUS
  Filled 2011-10-31: qty 50

## 2011-10-31 MED ORDER — NEPRO/CARBSTEADY PO LIQD
237.0000 mL | Freq: Two times a day (BID) | ORAL | Status: DC
  Administered 2011-10-31 – 2011-11-03 (×4): 237 mL via ORAL

## 2011-10-31 MED ORDER — COLLAGENASE 250 UNIT/GM EX OINT
TOPICAL_OINTMENT | Freq: Every day | CUTANEOUS | Status: DC
  Administered 2011-10-31: 14:00:00 via TOPICAL
  Administered 2011-10-31: 1 via TOPICAL
  Administered 2011-11-01: 10:00:00 via TOPICAL
  Filled 2011-10-31: qty 30

## 2011-10-31 NOTE — Consult Note (Signed)
WOC consult Note Reason for Consult: Wound consult requested for right leg chronic full thickness wounds.  Pt has been followed by outpatient wound care center prior to admission.  Also had an appointment with Dr Myra Gianotti of [facility] on 3/4.  Progress notes indicate he feels wounds are not healing R/T vascular insufficiency.  Recent ABI inadequate for wound healing at .15.  He plans for revascularization procedure later this month. Wound type: Full thickness stasis ulcers to right leg in 2 areas. Measurement: Upper leg 7X3X.5cm, 70% yellow, 30% red, dry wound bed with no odor. Lower leg 5.5X4X.4cm 90% yellow, 10% red, dry wound bed with no odor. Dressing procedure/placement/frequency: Will continue present plan of care with Santyl for chemical debridement.  Pt can resume follow up with outpatient wound care center and [facility] after discharge for further plan of care.  Will not plan to follow further unless re-consulted.  [address], RN, MSN, [employer]  [phone]

## 2011-10-31 NOTE — Progress Notes (Signed)
CARE MANAGEMENT NOTE 10/31/2011  Patient:  [name], [name]   Account Number:  [id number]  Date Initiated:  10/31/2011  Documentation initiated by:  Rosemary Holms  Subjective/Objective Assessment:   Pt admited from home with parents with abd pain. Has multiple medical issues. PTA had [facility] RN and Aide.     Action/Plan:   Resume [facility] at DC and CM will follow to assess additional needs.   Anticipated DC Date:  11/01/2011   Anticipated DC Plan:  HOME W HOME HEALTH SERVICES      DC Planning Services  CM consult      Choice offered to / List presented to:             [facility] agency  [facility]   Status of service:  In process, will continue to follow Medicare Important Message given?   (If response is "NO", the following Medicare IM given date fields will be blank) Date Medicare IM given:   Date Additional Medicare IM given:    Discharge Disposition:  HOME W HOME HEALTH SERVICES  Per UR Regulation:    Comments:  10/31/11 1400 Alaena Strader Leanord Hawking RN BSN

## 2011-10-31 NOTE — Progress Notes (Signed)
INITIAL ADULT NUTRITION ASSESSMENT Date: 10/31/2011   Time: 11:19 AM Reason for Assessment: Patient with wounds  ASSESSMENT: Female 41 y.o.  Dx: <principal problem not specified>  Hx:  Past Medical History  Diagnosis Date  . DVT (deep venous thrombosis)     DVT HISTORY  . Renal failure   . Diabetes mellitus   . Migraine     HISTORY  . Anxiety and depression   . CAD (coronary artery disease)   . Bipolar 1 disorder   . Nephrotic syndrome     IN THE PAST  . Hyperlipidemia   . Hyperparathyroidism   . PE (pulmonary embolism)     HISTORY  . Dialysis patient   . Anemia   . Hypertension    Related Meds:  Scheduled Meds:   . amLODipine  10 mg Oral Daily  . ARIPiprazole  2 mg Oral Daily  . brimonidine  1 drop Right Eye Q12H   And  . timolol  1 drop Right Eye Q12H  . brinzolamide  1 drop Right Eye TID  . ceFEPime (MAXIPIME) IV  1 g Intravenous Once  . cinacalcet  30 mg Oral Daily  . cloNIDine  0.2 mg Oral BID  . clopidogrel  75 mg Oral Daily  . collagenase   Topical Daily  . fluconazole (DIFLUCAN) IV  100 mg Intravenous Q24H  . fluconazole (DIFLUCAN) IV  200 mg Intravenous Once  . gatifloxacin  1 drop Right Eye Q6H  . homatropine  1 drop Right Eye TID  . HYDROmorphone  1 mg Intramuscular Once  .  HYDROmorphone (DILAUDID) injection  1 mg Intramuscular Once  . insulin aspart  0-9 Units Subcutaneous TID WC  . insulin glargine  30 Units Subcutaneous QHS  . levofloxacin (LEVAQUIN) IV  750 mg Intravenous Once  . lisinopril  40 mg Oral Daily  . loteprednol  1 drop Right Eye QID  . metoCLOPramide (REGLAN) injection  10 mg Intravenous Once  . metoCLOPramide  10 mg Oral TID AC & HS  . multivitamin  1 tablet Oral Daily  . nicotine  14 mg Transdermal Daily  . ofloxacin  1 drop Right Eye TID  . olopatadine  1 drop Right Eye Daily  . ondansetron  8 mg Oral Once  . pantoprazole  40 mg Oral Q1200  . PARoxetine  80 mg Oral Daily  . piperacillin-tazobactam (ZOSYN)  IV  2.25 g  Intravenous Q8H  . piperacillin-tazobactam (ZOSYN)  IV  3.375 g Intravenous Once  . polyvinyl alcohol  1 drop Both Eyes QID  . sevelamer  4,000 mg Oral TID WC  . sodium bicarbonate  650 mg Oral BID  . sodium chloride  3 mL Intravenous Q12H  . sodium chloride      . sucralfate  1 g Oral BID AC  . torsemide  100 mg Oral Daily  . travoprost (benzalkonium)  1 drop Right Eye QHS  . vancomycin  1,000 mg Intravenous Once  . DISCONTD: brimonidine  1 drop Right Eye BID  . DISCONTD: brimonidine-timolol  1 drop Right Eye Q12H  . DISCONTD:  HYDROmorphone (DILAUDID) injection  1 mg Intravenous Once  . DISCONTD: ketorolac  1 drop Right Eye TID  . DISCONTD: oxyCODONE-acetaminophen  1 tablet Oral Q4H  . DISCONTD: sevelamer  1,600-4,000 mg Oral 5 X Daily  . DISCONTD: SYSTANE LID WIPES  1 application Right Eye Daily  . DISCONTD: vancomycin  1,000 mg Intravenous Once   Continuous Infusions:   . DISCONTD: sodium  chloride     PRN Meds:.sodium chloride, acetaminophen, acetaminophen, albuterol, ALPRAZolam, artificial tears, butalbital-acetaminophen-caffeine, oxyCODONE, oxyCODONE-acetaminophen, promethazine, sevelamer, sodium chloride, DISCONTD: ondansetron  Ht: 5\' 10"  (177.8 cm)  Wt: 190 lb (86.183 kg)  Ideal Wt: 68.5 kg % Ideal Wt: 126.6  Usual Wt: unknown  Body mass index is 27.26 kg/(m^2). Overweight  Food/Nutrition Related Hx: Patient reported poor appetite and not eating much. She stated she was not eating much at home prior to hospitalization, but unable to justify how long this has gone on. Patient with right lower extremity wounds. PO documented 0% at meals.   Labs:  CMP     Component Value Date/Time   NA 128* 10/31/2011 0447   K 3.9 10/31/2011 0447   CL 85* 10/31/2011 0447   CO2 26 10/31/2011 0447   GLUCOSE 199* 10/31/2011 0447   BUN 57* 10/31/2011 0447   CREATININE 5.92* 10/31/2011 0447   CREATININE 1.78* 10/17/2008 2000   CALCIUM 9.9 10/31/2011 0447   CALCIUM 8.3* 08/21/2011 0533   PROT 6.2  10/31/2011 0447   ALBUMIN 2.2* 10/31/2011 0447   AST 9 10/31/2011 0447   ALT 10 10/31/2011 0447   ALKPHOS 176* 10/31/2011 0447   BILITOT 0.1* 10/31/2011 0447   GFRNONAA 8* 10/31/2011 0447   GFRAA 9* 10/31/2011 0447    Intake/Output Summary (Last 24 hours) at 10/31/11 1121 Last data filed at 10/31/11 0800  Gross per 24 hour  Intake    515 ml  Output      0 ml  Net    515 ml    Diet Order: Renal  Supplements/Tube Feeding: none at this time  IVF:    DISCONTD: sodium chloride    Estimated Nutritional Needs:   Kcal: 2150-2590 Protein: 103.6-129.5 Fluid: 500cc + Urine output  NUTRITION DIAGNOSIS: -Inadequate oral intake (NI-2.1).  Status: Ongoing  RELATED TO: poor appetite and wounds  AS EVIDENCE BY: patient with documented PO intake of 0% at meals, patient reports poor intake and increased calorie and protein needs for RLE wounds healing.  MONITORING/EVALUATION(Goals): PO intake, weight trends, labs, I/O's, skin 1. Meet > 90% of estimated nutrition needs 2. PO intake greater than 75% at meals and supplements.  EDUCATION NEEDS: -No education needs identified at this time  INTERVENTION: 1. Will order Nepro supplement BID. Provides 850 kcal and 40 grams of protein daily.  2. RD to follow for nutrition needs  Dietitian [phone]  DOCUMENTATION CODES Per approved criteria  -Not Applicable    Iven Finn [facility] 10/31/2011, 11:19 AM

## 2011-10-31 NOTE — Progress Notes (Signed)
Pt provided with smoking cessation handout. No questions at this time. Will continue to monitor.

## 2011-10-31 NOTE — Progress Notes (Signed)
PROGRESS NOTE  [name] VHQ:[id number] DOB: [birth year] DOA: 10/30/2011 PCP: Ernestine Conrad, MD, MD  Brief narrative: [name] is a 41 year old female with a PMH of gastroparesis and ESRD on HD who was admitted on 10/30/11 with abdominal pain, vomiting and cough in the setting of having missed her last HD treatment.  In ED, found to have possible PNA/UTI, along with a left leg ulcer.    Assessment/Plan: Principal Problem:  *PNA (pneumonia) The patient was given a dose of Cefipime, Levaquin and Vancomycin in the ER.  She is currently being treated with Vancomycin and Zosyn. Blood cultures and sputum cultures were not sent, and since the patient is now on antibiotics, are unlikely to be helpful.   Active Problems:  Vomiting Likely secondary to gastroparesis.  Currently on oral reglan therapy, but she has been refusing.  Will switch to IV reglan, since she still is having episodes of vomiting.  ESRD on dialysis Nephrology consultation for HD has been requested.  HTN (hypertension), malignant BP high, but refused oral medications.  Continue Norvasc, Clonidine, Lisinopril, Demadex as tolerated.  DM (diabetes mellitus), type 2 with complications /  Diabetic leg ulcer No recent hemoglobin A1c in EPIC, but was 6.8 07/06/10, and one is pending now.  CBG 185-206.  Continue Lantus, SSI for now.  Ulcers of both lower extremities /  PVD (peripheral vascular disease) Seen by Dr. Myra Gianotti of vascular surgery on 10/29/11.  Followed in wound care center.  Doppler studies were recently obtained which show an ABI of 0.15 on the right and 0.48 on the left.  Dr. Myra Gianotti feels that her wounds are limb threatening, with plans to do angiography with possible stenting on 11/06/11.  She has been seen by the wound care nurse with plans to use Santyl for chemical debridement.  She will need to follow up at the wound center post discharge.  Tobacco abuse Counseled by admitting MD.  Will request tobacco cessation education be  provided by nursing staff.  Continue nicotine patch.  Hyperparathyroidism, secondary renal Continue phosphate binders.  Bipolar 1 disorder Continue Abilify, PRN Xanax.  Hyponatremia ? Pseudohyponatremia related to elevated blood glucoses along with impairment of free water excretion from ESRD.  HD should correct.  Normocytic anemia Mild.  Related to CKD.  UTI Urine cultures sent.  On broad spectrum antibiotics.  Code Status: Full Family Communication: [name] Mother [phone] Disposition Plan: Home when stable  Consultants:  Nephrology consult pending.  Wound care RN  Pharmacy for antibiotics  Procedures:  EKG 10/31/11: Sinus rhythm with 1st degree A-V block; Left axis deviation; Anteroseptal infarct , age undetermined Abnormal ECG  Antibiotics:  Cefepime 10/30/11 ---> 10/30/11  Diflucan 10/31/11 ---->  Levaquin 10/30/11 ---> 10/30/11  Zosyn 10/31/11 --->  Vancomycin ---->   Subjective  [name] is lethargic and only wakes up with stimulation.  She says she has had about 3 episodes of vomiting since admission.   Objective    Interim History: The nursing staff report that [name] refuse all of her PO medications this morning.  She has been lethargic, but the nurses state that they have not given her any sedating medications.   Objective: Filed Vitals:   10/30/11 2253 10/30/11 2330 10/31/11 0158 10/31/11 0550  BP: 159/64 192/71 173/68 143/64  Pulse: 97 99  86  Temp: 98.1 F (36.7 C) 98.7 F (37.1 C)  98.4 F (36.9 C)  TempSrc: Oral Oral  Oral  Resp: 18 18  18   Height:  Weight:      SpO2: 94% 94%  94%    Intake/Output Summary (Last 24 hours) at 10/31/11 1305 Last data filed at 10/31/11 0800  Gross per 24 hour  Intake    515 ml  Output      0 ml  Net    515 ml    Exam: Gen:  NAD, lethargic Cardiovascular:  RRR, No M/R/G Respiratory: Lungs course with rhonchi and a few expiratory wheezes Gastrointestinal: Abdomen soft, NT/ND with normal  active bowel sounds. Extremities: RLE diabetic wounds to upper and lower leg.    Data Reviewed: Basic Metabolic Panel:  Lab 10/31/11 1610 10/30/11 1848  NA 128* 128*  K 3.9 4.3  CL 85* 85*  CO2 26 26  GLUCOSE 199* 262*  BUN 57* 53*  CREATININE 5.92* 5.72*  CALCIUM 9.9 10.4  MG -- --  PHOS -- --   Liver Function Tests:  Lab 10/31/11 0447 10/30/11 1848  AST 9 10  ALT 10 12  ALKPHOS 176* 224*  BILITOT 0.1* 0.1*  PROT 6.2 7.4  ALBUMIN 2.2* 2.7*    Lab 10/30/11 1848  LIPASE 20  AMYLASE --   CBC:  Lab 10/31/11 0447 10/30/11 1848  WBC 10.9* 13.6*  NEUTROABS -- 12.5*  HGB 9.1* 11.2*  HCT 27.2* 33.1*  MCV 98.6 97.9  PLT 352 405*   CBG:  Lab 10/31/11 1147 10/31/11 0739  GLUCAP 206* 185*    Recent Results (from the past 240 hour(s))  MRSA PCR SCREENING     Status: Abnormal   Collection Time   10/31/11 10:08 AM      Component Value Range Status Comment   MRSA by PCR POSITIVE (*) NEGATIVE  Final      Studies:  Dg Tibia/fibula Right 10/30/2011  *RADIOLOGY REPORT*  Clinical Data: Soft tissue ulcerations and pain in the right lower leg.  RIGHT TIBIA AND FIBULA - 2 VIEW  Comparison: 10/06/2011  Findings: The tibia and fibula appear normal with no periosteal reaction or other significant abnormality.  Soft tissue ulceration of the distal lateral aspect of the right lower leg is more prominent than on the prior exam.  IMPRESSION: No evidence of osteomyelitis or other acute osseous abnormality.  Original Report Authenticated By: Gwynn Burly, M.D.     Dg Abd Acute W/chest 10/30/2011  *RADIOLOGY REPORT*  Clinical Data: Abdominal pain.  Vomiting.  ACUTE ABDOMEN SERIES (ABDOMEN 2 VIEW & CHEST 1 VIEW)  Comparison: None.  Findings: There is cardiomegaly with a Port-A-Cath in place. There is a small right pleural effusion.  Faint patchy infiltrate or parenchymal scarring at the left lung base laterally.  No free air or free fluid in the abdomen.  The abdomen is essentially devoid  of bowel gas.  No dilated loops of bowel. Harrington rods in the lower lumbar spine.  Extensive vascular calcification.  IMPRESSION:  1.  Small right pleural effusion. 2.  Small area of infiltrate or parenchymal scarring at the left lung base. 3.  Cardiomegaly. 4.  No acute abnormalities of the abdomen.  Original Report Authenticated By: Gwynn Burly, M.D.    Dg Foot Complete Right 10/30/2011  *RADIOLOGY REPORT*  Clinical Data: .  Right lower leg and right foot ulcerations. Right lower leg pain.  RIGHT FOOT COMPLETE - 3+ VIEW  Comparison: 10/06/2011  Findings: There is no evidence of osteomyelitis or significant arthritis. No visible joint effusions or bone destruction.  IMPRESSION: No significant abnormalities.  No change.  Original Report Authenticated  By: Gwynn Burly, M.D.     Scheduled Meds:    . amLODipine  10 mg Oral Daily  . ARIPiprazole  2 mg Oral Daily  . brimonidine  1 drop Right Eye Q12H   And  . timolol  1 drop Right Eye Q12H  . brinzolamide  1 drop Right Eye TID  . ceFEPime (MAXIPIME) IV  1 g Intravenous Once  . cinacalcet  30 mg Oral Daily  . cloNIDine  0.2 mg Oral BID  . clopidogrel  75 mg Oral Daily  . collagenase   Topical Daily  . fluconazole (DIFLUCAN) IV  100 mg Intravenous Q24H  . fluconazole (DIFLUCAN) IV  200 mg Intravenous Once  . gatifloxacin  1 drop Right Eye Q6H  . homatropine  1 drop Right Eye TID  . HYDROmorphone  1 mg Intramuscular Once  .  HYDROmorphone (DILAUDID) injection  1 mg Intramuscular Once  . insulin aspart  0-9 Units Subcutaneous TID WC  . insulin glargine  30 Units Subcutaneous QHS  . levofloxacin (LEVAQUIN) IV  750 mg Intravenous Once  . lisinopril  40 mg Oral Daily  . loteprednol  1 drop Right Eye QID  . metoCLOPramide (REGLAN) injection  10 mg Intravenous Once  . metoCLOPramide  10 mg Oral TID AC & HS  . multivitamin  1 tablet Oral Daily  . nicotine  14 mg Transdermal Daily  . ofloxacin  1 drop Right Eye TID  . olopatadine  1  drop Right Eye Daily  . ondansetron  8 mg Oral Once  . pantoprazole  40 mg Oral Q1200  . PARoxetine  80 mg Oral Daily  . piperacillin-tazobactam (ZOSYN)  IV  2.25 g Intravenous Q8H  . piperacillin-tazobactam (ZOSYN)  IV  3.375 g Intravenous Once  . polyvinyl alcohol  1 drop Both Eyes QID  . sevelamer  4,000 mg Oral TID WC  . sodium bicarbonate  650 mg Oral BID  . sodium chloride  3 mL Intravenous Q12H  . sodium chloride      . sucralfate  1 g Oral BID AC  . torsemide  100 mg Oral Daily  . travoprost (benzalkonium)  1 drop Right Eye QHS  . vancomycin  1,000 mg Intravenous Once  . DISCONTD: brimonidine  1 drop Right Eye BID  . DISCONTD: brimonidine-timolol  1 drop Right Eye Q12H  . DISCONTD:  HYDROmorphone (DILAUDID) injection  1 mg Intravenous Once  . DISCONTD: ketorolac  1 drop Right Eye TID  . DISCONTD: oxyCODONE-acetaminophen  1 tablet Oral Q4H  . DISCONTD: sevelamer  1,600-4,000 mg Oral 5 X Daily  . DISCONTD: SYSTANE LID WIPES  1 application Right Eye Daily  . DISCONTD: vancomycin  1,000 mg Intravenous Once   Continuous Infusions:    . DISCONTD: sodium chloride        LOS: 1 day   Hillery Aldo, MD Pager [phone]  10/31/2011, 1:05 PM

## 2011-10-31 NOTE — Plan of Care (Signed)
Problem: Phase I Progression Outcomes Goal: Pain controlled with appropriate interventions Outcome: Progressing Pt still complains of pain at this time, but is resting very well when I enter the room.

## 2011-10-31 NOTE — Progress Notes (Signed)
ANTIBIOTIC CONSULT NOTE - INITIAL  Pharmacy Consult for Vancomycin, Zosyn, Fluconazole Indication: rule out pneumonia (Vanc/Zosyn)                     GU (Fluconazole)  Allergies  Allergen Reactions  . Compazine (Prochlorperazine) Swelling    Per echart records  . Contrast Media (Iodinated Diagnostic Agents)     Per echart records  . Meperidine Hcl Swelling  . Metformin And Related Other (See Comments)    'just not myself, was told not to take metformin'  . Morphine Swelling  . Nubain (Nalbuphine Hcl) Swelling  . Topiramate (Topamax) Swelling    Tongue swelling - per echart records  . Toradol (Ketorolac Tromethamine) Swelling    Patient Measurements: Height: 5\' 10"  (177.8 cm) Weight: 190 lb (86.183 kg) IBW/kg (Calculated) : 68.5   Vital Signs: Temp: 98.4 F (36.9 C) (03/06 0550) Temp src: Oral (03/06 0550) BP: 143/64 mmHg (03/06 0550) Pulse Rate: 86  (03/06 0550) Intake/Output from previous day: 03/05 0701 - 03/06 0700 In: 515 [I.V.:13; IV Piggyback:502] Out: -  Intake/Output from this shift:    Labs:  [facility] 10/31/11 0447 10/30/11 1848  WBC 10.9* 13.6*  HGB 9.1* 11.2*  PLT 352 405*  LABCREA -- --  CREATININE 5.92* 5.72*   Estimated Creatinine Clearance: 14.8 ml/min (by C-G formula based on Cr of 5.92).    Microbiology: No results found for this or any previous visit (from the past 720 hour(s)).  Medical History: Past Medical History  Diagnosis Date  . DVT (deep venous thrombosis)     DVT HISTORY  . Renal failure   . Diabetes mellitus   . Migraine     HISTORY  . Anxiety and depression   . CAD (coronary artery disease)   . Bipolar 1 disorder   . Nephrotic syndrome     IN THE PAST  . Hyperlipidemia   . Hyperparathyroidism   . PE (pulmonary embolism)     HISTORY  . Dialysis patient   . Anemia   . Hypertension     Medications:  Anti-infectives     Start     Dose/Rate Route Frequency Ordered Stop   10/31/11 1100  piperacillin-tazobactam  (ZOSYN) IVPB 2.25 g       2.25 g 100 mL/hr over 30 Minutes Intravenous Every 8 hours 10/31/11 1045     10/31/11 1100   fluconazole (DIFLUCAN) IVPB 100 mg        100 mg 50 mL/hr over 60 Minutes Intravenous Every 24 hours 10/31/11 1047     10/31/11 0200   fluconazole (DIFLUCAN) IVPB 200 mg        200 mg 100 mL/hr over 60 Minutes Intravenous  Once 10/31/11 0112 10/31/11 0411   10/31/11 0115  piperacillin-tazobactam (ZOSYN) IVPB 3.375 g       3.375 g 12.5 mL/hr over 240 Minutes Intravenous  Once 10/31/11 0112 10/31/11 0741   10/30/11 2200   vancomycin (VANCOCIN) IVPB 1000 mg/200 mL premix        1,000 mg 200 mL/hr over 60 Minutes Intravenous  Once 10/30/11 2117 10/31/11 0310   10/30/11 2115   ceFEPIme (MAXIPIME) 1 g in dextrose 5 % 50 mL IVPB        1 g 100 mL/hr over 30 Minutes Intravenous  Once 10/30/11 2103 10/30/11 2236   10/30/11 2115   Levofloxacin (LEVAQUIN) IVPB 750 mg        750 mg 100 mL/hr over 90 Minutes Intravenous  Once 10/30/11 2103 10/31/11 0244   10/30/11 2100   vancomycin (VANCOCIN) IVPB 1000 mg/200 mL premix  Status:  Discontinued        1,000 mg 200 mL/hr over 60 Minutes Intravenous  Once 10/30/11 2055 10/30/11 2103         Assessment: Okay for Protocol Renal Failure  Goal of Therapy:  Vancomycin trough level 15-20 mcg/ml  Plan:  Measure antibiotic drug levels at steady state Follow up culture results Vancomycin 1gm IV x 1. Zosyn 2.25 grams IV every 8 hours Fluconazole 100 mg IV daily. F/U AM labs and renal plan.  Re-dose antibiotics accordingly.  Gilman Buttner, [place] J 10/31/2011,10:50 AM

## 2011-11-01 LAB — GLUCOSE, CAPILLARY
Glucose-Capillary: 99 mg/dL (ref 70–99)
Glucose-Capillary: 100 mg/dL — ABNORMAL HIGH (ref 70–99)
Glucose-Capillary: 271 mg/dL — ABNORMAL HIGH (ref 70–99)

## 2011-11-01 LAB — URINE CULTURE
Colony Count: >=100000
Culture  Setup Time: 201303052030

## 2011-11-01 LAB — CBC
HCT: 28.2 % — ABNORMAL LOW (ref 36.0–46.0)
Hemoglobin: 9.3 g/dL — ABNORMAL LOW (ref 12.0–15.0)
MCV: 98.3 fL (ref 78.0–100.0)
RBC: 2.87 MIL/uL — ABNORMAL LOW (ref 3.87–5.11)
WBC: 11.2 10*3/uL — ABNORMAL HIGH (ref 4.0–10.5)

## 2011-11-01 LAB — BASIC METABOLIC PANEL
CO2: 27 mEq/L (ref 19–32)
Chloride: 91 mEq/L — ABNORMAL LOW (ref 96–112)
Sodium: 132 mEq/L — ABNORMAL LOW (ref 135–145)

## 2011-11-01 MED ORDER — HEPARIN SODIUM (PORCINE) 5000 UNIT/ML IJ SOLN
5000.0000 [IU] | Freq: Three times a day (TID) | INTRAMUSCULAR | Status: DC
  Administered 2011-11-02 – 2011-11-03 (×2): 5000 [IU] via SUBCUTANEOUS
  Filled 2011-11-01 (×2): qty 1

## 2011-11-01 MED ORDER — MOXIFLOXACIN HCL 400 MG PO TABS: 400.0000 mg | ORAL_TABLET | Freq: Every day | ORAL | Status: DC

## 2011-11-01 MED ORDER — HEPARIN SODIUM (PORCINE) 1000 UNIT/ML DIALYSIS
500.0000 [IU] | INTRAMUSCULAR | Status: DC | PRN
  Administered 2011-11-02 (×2): 500 [IU] via INTRAVENOUS_CENTRAL
  Filled 2011-11-01: qty 1

## 2011-11-01 MED ORDER — HEPARIN SODIUM (PORCINE) 1000 UNIT/ML DIALYSIS
20.0000 [IU]/kg | INTRAMUSCULAR | Status: DC | PRN
  Administered 2011-11-02: 1700 [IU] via INTRAVENOUS_CENTRAL
  Filled 2011-11-01: qty 2

## 2011-11-01 MED ORDER — HYDROMORPHONE HCL PF 1 MG/ML IJ SOLN
1.0000 mg | Freq: Four times a day (QID) | INTRAMUSCULAR | Status: DC | PRN
  Administered 2011-11-01 – 2011-11-03 (×5): 1 mg via INTRAMUSCULAR
  Filled 2011-11-01 (×6): qty 1

## 2011-11-01 MED ORDER — DIPHENHYDRAMINE HCL 25 MG PO CAPS
50.0000 mg | ORAL_CAPSULE | Freq: Once | ORAL | Status: AC
  Administered 2011-11-02: 50 mg via ORAL
  Filled 2011-11-01: qty 2

## 2011-11-01 MED ORDER — HYDRALAZINE HCL 20 MG/ML IJ SOLN
10.0000 mg | Freq: Four times a day (QID) | INTRAMUSCULAR | Status: DC | PRN
  Administered 2011-11-01 – 2011-11-02 (×2): 10 mg via INTRAVENOUS
  Filled 2011-11-01 (×3): qty 1

## 2011-11-01 MED ORDER — LEVOFLOXACIN 500 MG PO TABS: 750.0000 mg | ORAL_TABLET | Freq: Every day | ORAL | Status: DC

## 2011-11-01 MED ORDER — PREDNISONE 20 MG PO TABS
50.0000 mg | ORAL_TABLET | Freq: Once | ORAL | Status: AC
  Administered 2011-11-02: 50 mg via ORAL
  Filled 2011-11-01: qty 2

## 2011-11-01 MED ORDER — PREDNISONE 20 MG PO TABS
50.0000 mg | ORAL_TABLET | Freq: Once | ORAL | Status: AC
  Administered 2011-11-01: 50 mg via ORAL
  Filled 2011-11-01: qty 2

## 2011-11-01 MED ORDER — DEXTROSE 5 % IV SOLN
1.0000 g | INTRAVENOUS | Status: DC
  Administered 2011-11-01 – 2011-11-02 (×2): 1 g via INTRAVENOUS
  Filled 2011-11-01 (×5): qty 10

## 2011-11-01 NOTE — Progress Notes (Signed)
Dr. York Pellant arrived to floor at 1650 regarding pt's BP. Verbalized orders for Hydralazine 10 mg IV q 6 hours PRN for SPB greater than 170. Will administer first dose now. Will continue to monitor.

## 2011-11-01 NOTE — Consult Note (Signed)
Reason for Consult: End-stage renal disease Referring Physician: Dr. Ted Mcalpine is an 41 y.o. female.  HPI: Is a patient with well-known to me who has history of her diabetes, diabetic gastropathy, history of PE and end-stage renal disease presently had came with complaints of  nausea or vomiting. Patient has chronic problem of nausea vomiting thought to be secondary to diabetic gastropathy. She also complains of  cough and nonproductive. According to the patient she was not able to eat or drink anything because of the severe nausea vomiting. Presently patient was found to have pneumonia and also worsening of her cellulitis hence admitted to the hospital. Patient very noncompliant with her dialysis and also her fluid intake . In spite of multiple discussion with the patient and her family's  including her mother patient still continued to miss dialysis and at times comes for a short dialysis only.  Past Medical History  Diagnosis Date  . DVT (deep venous thrombosis)     DVT HISTORY  . Renal failure   . Diabetes mellitus   . Migraine     HISTORY  . Anxiety and depression   . CAD (coronary artery disease)   . Bipolar 1 disorder   . Nephrotic syndrome     IN THE PAST  . Hyperlipidemia   . Hyperparathyroidism   . PE (pulmonary embolism)     HISTORY  . Dialysis patient   . Anemia   . Hypertension     Past Surgical History  Procedure Date  . Back surgery X 4  . Cholecystectomy   . Incise and drain abcess     OF THIGHS FROM INSULIN INJECTIONS  . Heart stents     3 - 4  STENTS PLACED  . Portacath placement 2003  . Eye surgery     Family History  Problem Relation Age of Onset  . Transient ischemic attack Mother   . Hepatitis Sister   . Diabetes type II Other     Social History:  reports that she has been smoking Cigarettes.  She has a 40 pack-year smoking history. She has never used smokeless tobacco. She reports that she does not drink alcohol or use illicit  drugs.  Allergies:  Allergies  Allergen Reactions  . Compazine (Prochlorperazine) Swelling    Per echart records  . Contrast Media (Iodinated Diagnostic Agents)     Per echart records  . Meperidine Hcl Swelling  . Metformin And Related Other (See Comments)    'just not myself, was told not to take metformin'  . Morphine Swelling  . Nubain (Nalbuphine Hcl) Swelling  . Topiramate (Topamax) Swelling    Tongue swelling - per echart records  . Toradol (Ketorolac Tromethamine) Swelling    Medications: I have reviewed the patient's current medications.  Results for orders placed during the hospital encounter of 10/30/11 (from the past 48 hour(s))  CBC     Status: Abnormal   Collection Time   10/30/11  6:48 PM      Component Value Range Comment   WBC 13.6 (*) 4.0 - 10.5 (K/uL)    RBC 3.38 (*) 3.87 - 5.11 (MIL/uL)    Hemoglobin 11.2 (*) 12.0 - 15.0 (g/dL)    HCT 64.4 (*) 03.4 - 46.0 (%)    MCV 97.9  78.0 - 100.0 (fL)    MCH 33.1  26.0 - 34.0 (pg)    MCHC 33.8  30.0 - 36.0 (g/dL)    RDW 74.2 (*) 59.5 - 15.5 (%)  Platelets 405 (*) 150 - 400 (K/uL)   DIFFERENTIAL     Status: Abnormal   Collection Time   10/30/11  6:48 PM      Component Value Range Comment   Neutrophils Relative 92 (*) 43 - 77 (%)    Neutro Abs 12.5 (*) 1.7 - 7.7 (K/uL)    Lymphocytes Relative 6 (*) 12 - 46 (%)    Lymphs Abs 0.8  0.7 - 4.0 (K/uL)    Monocytes Relative 2 (*) 3 - 12 (%)    Monocytes Absolute 0.3  0.1 - 1.0 (K/uL)    Eosinophils Relative 0  0 - 5 (%)    Eosinophils Absolute 0.0  0.0 - 0.7 (K/uL)    Basophils Relative 0  0 - 1 (%)    Basophils Absolute 0.1  0.0 - 0.1 (K/uL)   LIPASE, BLOOD     Status: Normal   Collection Time   10/30/11  6:48 PM      Component Value Range Comment   Lipase 20  11 - 59 (U/L)   COMPREHENSIVE METABOLIC PANEL     Status: Abnormal   Collection Time   10/30/11  6:48 PM      Component Value Range Comment   Sodium 128 (*) 135 - 145 (mEq/L)    Potassium 4.3  3.5 - 5.1  (mEq/L)    Chloride 85 (*) 96 - 112 (mEq/L)    CO2 26  19 - 32 (mEq/L)    Glucose, Bld 262 (*) 70 - 99 (mg/dL)    BUN 53 (*) 6 - 23 (mg/dL)    Creatinine, Ser 1.61 (*) 0.50 - 1.10 (mg/dL)    Calcium 09.6  8.4 - 10.5 (mg/dL)    Total Protein 7.4  6.0 - 8.3 (g/dL)    Albumin 2.7 (*) 3.5 - 5.2 (g/dL)    AST 10  0 - 37 (U/L)    ALT 12  0 - 35 (U/L)    Alkaline Phosphatase 224 (*) 39 - 117 (U/L)    Total Bilirubin 0.1 (*) 0.3 - 1.2 (mg/dL)    GFR calc non Af Amer 8 (*) >90 (mL/min)    GFR calc Af Amer 10 (*) >90 (mL/min)   URINALYSIS, ROUTINE W REFLEX MICROSCOPIC     Status: Abnormal   Collection Time   10/30/11  7:44 PM      Component Value Range Comment   Color, Urine YELLOW  YELLOW     APPearance HAZY (*) CLEAR     Specific Gravity, Urine 1.020  1.005 - 1.030     pH 5.0  5.0 - 8.0     Glucose, UA 250 (*) NEGATIVE (mg/dL)    Hgb urine dipstick SMALL (*) NEGATIVE     Bilirubin Urine NEGATIVE  NEGATIVE     Ketones, ur NEGATIVE  NEGATIVE (mg/dL)    Protein, ur 045 (*) NEGATIVE (mg/dL)    Urobilinogen, UA 0.2  0.0 - 1.0 (mg/dL)    Nitrite NEGATIVE  NEGATIVE     Leukocytes, UA MODERATE (*) NEGATIVE    URINE CULTURE     Status: Normal (Preliminary result)   Collection Time   10/30/11  7:44 PM      Component Value Range Comment   Specimen Description URINE, CLEAN CATCH      Special Requests NONE      Culture  Setup Time [id number]      Colony Count >=100,000 COLONIES/ML      Culture ESCHERICHIA COLI  Report Status PENDING     URINE MICROSCOPIC-ADD ON     Status: Abnormal   Collection Time   10/30/11  7:44 PM      Component Value Range Comment   Squamous Epithelial / LPF MANY (*) RARE     WBC, UA TOO NUMEROUS TO COUNT  <3 (WBC/hpf)    RBC / HPF 0-2  <3 (RBC/hpf)    Bacteria, UA MANY (*) RARE     Urine-Other YEAST     TSH     Status: Normal   Collection Time   10/30/11 11:32 PM      Component Value Range Comment   TSH 1.046  0.350 - 4.500 (uIU/mL)   COMPREHENSIVE METABOLIC  PANEL     Status: Abnormal   Collection Time   10/31/11  4:47 AM      Component Value Range Comment   Sodium 128 (*) 135 - 145 (mEq/L)    Potassium 3.9  3.5 - 5.1 (mEq/L)    Chloride 85 (*) 96 - 112 (mEq/L)    CO2 26  19 - 32 (mEq/L)    Glucose, Bld 199 (*) 70 - 99 (mg/dL)    BUN 57 (*) 6 - 23 (mg/dL)    Creatinine, Ser 5.40 (*) 0.50 - 1.10 (mg/dL)    Calcium 9.9  8.4 - 10.5 (mg/dL)    Total Protein 6.2  6.0 - 8.3 (g/dL)    Albumin 2.2 (*) 3.5 - 5.2 (g/dL)    AST 9  0 - 37 (U/L)    ALT 10  0 - 35 (U/L)    Alkaline Phosphatase 176 (*) 39 - 117 (U/L)    Total Bilirubin 0.1 (*) 0.3 - 1.2 (mg/dL)    GFR calc non Af Amer 8 (*) >90 (mL/min)    GFR calc Af Amer 9 (*) >90 (mL/min)   CBC     Status: Abnormal   Collection Time   10/31/11  4:47 AM      Component Value Range Comment   WBC 10.9 (*) 4.0 - 10.5 (K/uL)    RBC 2.76 (*) 3.87 - 5.11 (MIL/uL)    Hemoglobin 9.1 (*) 12.0 - 15.0 (g/dL)    HCT 98.1 (*) 19.1 - 46.0 (%)    MCV 98.6  78.0 - 100.0 (fL)    MCH 33.0  26.0 - 34.0 (pg)    MCHC 33.5  30.0 - 36.0 (g/dL)    RDW 47.8 (*) 29.5 - 15.5 (%)    Platelets 352  150 - 400 (K/uL)   PROTIME-INR     Status: Abnormal   Collection Time   10/31/11  4:47 AM      Component Value Range Comment   Prothrombin Time 15.3 (*) 11.6 - 15.2 (seconds)    INR 1.18  0.00 - 1.49    APTT     Status: Normal   Collection Time   10/31/11  4:47 AM      Component Value Range Comment   aPTT 34  24 - 37 (seconds)   HEMOGLOBIN A1C     Status: Abnormal   Collection Time   10/31/11  4:47 AM      Component Value Range Comment   Hemoglobin A1C 7.0 (*) <5.7 (%)    Mean Plasma Glucose 154 (*) <117 (mg/dL)   LIPID PANEL     Status: Abnormal   Collection Time   10/31/11  4:47 AM      Component Value Range Comment   Cholesterol 146  0 - 200 (mg/dL)    Triglycerides 161 (*) <150 (mg/dL)    HDL 42  >09 (mg/dL)    Total CHOL/HDL Ratio 3.5      VLDL 41 (*) 0 - 40 (mg/dL)    LDL Cholesterol 63  0 - 99 (mg/dL)   GLUCOSE,  CAPILLARY     Status: Abnormal   Collection Time   10/31/11  7:39 AM      Component Value Range Comment   Glucose-Capillary 185 (*) 70 - 99 (mg/dL)   MRSA PCR SCREENING     Status: Abnormal   Collection Time   10/31/11 10:08 AM      Component Value Range Comment   MRSA by PCR POSITIVE (*) NEGATIVE    GLUCOSE, CAPILLARY     Status: Abnormal   Collection Time   10/31/11 11:47 AM      Component Value Range Comment   Glucose-Capillary 206 (*) 70 - 99 (mg/dL)   GLUCOSE, CAPILLARY     Status: Abnormal   Collection Time   10/31/11  4:59 PM      Component Value Range Comment   Glucose-Capillary 155 (*) 70 - 99 (mg/dL)   GLUCOSE, CAPILLARY     Status: Abnormal   Collection Time   10/31/11  8:57 PM      Component Value Range Comment   Glucose-Capillary 183 (*) 70 - 99 (mg/dL)    Comment 1 Notify RN     BASIC METABOLIC PANEL     Status: Abnormal   Collection Time   11/01/11  6:15 AM      Component Value Range Comment   Sodium 132 (*) 135 - 145 (mEq/L)    Potassium 3.8  3.5 - 5.1 (mEq/L)    Chloride 91 (*) 96 - 112 (mEq/L)    CO2 27  19 - 32 (mEq/L)    Glucose, Bld 102 (*) 70 - 99 (mg/dL)    BUN 60 (*) 6 - 23 (mg/dL)    Creatinine, Ser 6.04 (*) 0.50 - 1.10 (mg/dL)    Calcium 54.0  8.4 - 10.5 (mg/dL)    GFR calc non Af Amer 7 (*) >90 (mL/min)    GFR calc Af Amer 8 (*) >90 (mL/min)   CBC     Status: Abnormal   Collection Time   11/01/11  6:15 AM      Component Value Range Comment   WBC 11.2 (*) 4.0 - 10.5 (K/uL)    RBC 2.87 (*) 3.87 - 5.11 (MIL/uL)    Hemoglobin 9.3 (*) 12.0 - 15.0 (g/dL)    HCT 98.1 (*) 19.1 - 46.0 (%)    MCV 98.3  78.0 - 100.0 (fL)    MCH 32.4  26.0 - 34.0 (pg)    MCHC 33.0  30.0 - 36.0 (g/dL)    RDW 47.8 (*) 29.5 - 15.5 (%)    Platelets 339  150 - 400 (K/uL)   GLUCOSE, CAPILLARY     Status: Normal   Collection Time   11/01/11  7:28 AM      Component Value Range Comment   Glucose-Capillary 99  70 - 99 (mg/dL)     Dg Tibia/fibula Right  10/30/2011  *RADIOLOGY REPORT*   Clinical Data: Soft tissue ulcerations and pain in the right lower leg.  RIGHT TIBIA AND FIBULA - 2 VIEW  Comparison: 10/06/2011  Findings: The tibia and fibula appear normal with no periosteal reaction or other significant abnormality.  Soft tissue ulceration of the distal lateral aspect  of the right lower leg is more prominent than on the prior exam.  IMPRESSION: No evidence of osteomyelitis or other acute osseous abnormality.  Original Report Authenticated By: Gwynn Burly, M.D.   Dg Abd Acute W/chest  10/30/2011  *RADIOLOGY REPORT*  Clinical Data: Abdominal pain.  Vomiting.  ACUTE ABDOMEN SERIES (ABDOMEN 2 VIEW & CHEST 1 VIEW)  Comparison: None.  Findings: There is cardiomegaly with a Port-A-Cath in place. There is a small right pleural effusion.  Faint patchy infiltrate or parenchymal scarring at the left lung base laterally.  No free air or free fluid in the abdomen.  The abdomen is essentially devoid of bowel gas.  No dilated loops of bowel. Harrington rods in the lower lumbar spine.  Extensive vascular calcification.  IMPRESSION:  1.  Small right pleural effusion. 2.  Small area of infiltrate or parenchymal scarring at the left lung base. 3.  Cardiomegaly. 4.  No acute abnormalities of the abdomen.  Original Report Authenticated By: Gwynn Burly, M.D.   Dg Foot Complete Right  10/30/2011  *RADIOLOGY REPORT*  Clinical Data: .  Right lower leg and right foot ulcerations. Right lower leg pain.  RIGHT FOOT COMPLETE - 3+ VIEW  Comparison: 10/06/2011  Findings: There is no evidence of osteomyelitis or significant arthritis. No visible joint effusions or bone destruction.  IMPRESSION: No significant abnormalities.  No change.  Original Report Authenticated By: Gwynn Burly, M.D.    Review of Systems  Respiratory: Positive for cough. Negative for sputum production and shortness of breath.   Cardiovascular: Negative for chest pain and orthopnea.  Gastrointestinal: Positive for nausea and  vomiting. Negative for diarrhea.  Neurological: Positive for weakness.  Psychiatric/Behavioral: Positive for depression.   Blood pressure 179/80, pulse 80, temperature 98.4 F (36.9 C), temperature source Oral, resp. rate 20, height 5\' 10"  (1.778 m), weight 86.183 kg (190 lb), SpO2 90.00%. Physical Exam  Eyes: No scleral icterus.  Neck: No JVD present.  Cardiovascular: Normal rate, regular rhythm and normal heart sounds.   No murmur heard. Respiratory: Effort normal. She has no wheezes. She has no rales.  GI: She exhibits no distension. There is tenderness. There is rebound.  Musculoskeletal: She exhibits no edema.  Skin: Skin is dry.    Assessment/Plan: Problem #1 end-stage renal disease she is status post hemodialysis on Saturday. Her BUN and creatinine was in acceptable range normal potassium. Patient was nausea vomiting at this moment could be a combination of diabetic gastropathy and also possible uremia. Problem #2 history of diabetes Problem #3 history of hypertension her blood pressure today seems to be high possibly her because such refused to take her antihypertensive medication. Problem #4 history of bipolar disorder Problem #5 history of peripheral vascular disease she has 2 small ulcers on the floor her leg her. Problem #6 history of obesity Problem #7 history of DVT/PE Problem #8 history of pneumonia Problem #10 history of anemia secondary to end-stage renal disease Problem #11.graft. Plan: We'll send patient to intervention radiology to do dialysis catheter placed or deep growth her grafts. After that will consider doing hemodialysis. We'll put her on clonidine patch 0.3 once a week We'll follow patient and encouraged to be compliant on dialysis when she is discharged from here. We'll check her CBC, basic metabolic panel and phosphorus in the morning.  [place] S 11/01/2011, 8:33 AM

## 2011-11-01 NOTE — Progress Notes (Signed)
Subjective: Nausea and vomiting have improved, once to try solid diet, has intermittent abd pain  Objective: Vital signs in last 24 hours: Temp:  [97.8 F (36.6 C)-98.4 F (36.9 C)] 98.4 F (36.9 C) (03/07 0617) Pulse Rate:  [80-93] 80  (03/07 0617) Resp:  [18-20] 20  (03/07 0617) BP: (179-182)/(79-82) 179/80 mmHg (03/07 0617) SpO2:  [88 %-92 %] 90 % (03/07 0617) Weight:  [80.4 kg (177 lb 4 oz)] 80.4 kg (177 lb 4 oz) (03/07 0854) Weight change:  Last BM Date:  (unknown)  Intake/Output from previous day: 03/06 0701 - 03/07 0700 In: 154 [IV Piggyback:154] Out: -      Physical Exam: General: Alert, awake, oriented x3, in no acute distress. HEENT: No bruits, no goiter. Heart: Regular rate and rhythm, without murmurs, rubs, gallops. Lungs: Clear to auscultation bilaterally. Abdomen: Soft, nontender, nondistended, positive bowel sounds. Extremities: No clubbing cyanosis or edema with positive pedal pulses. Neuro: Grossly intact, nonfocal.    Lab Results: Basic Metabolic Panel:  Basename 11/01/11 0615 10/31/11 0447  NA 132* 128*  K 3.8 3.9  CL 91* 85*  CO2 27 26  GLUCOSE 102* 199*  BUN 60* 57*  CREATININE 6.55* 5.92*  CALCIUM 10.2 9.9  MG -- --  PHOS -- --   Liver Function Tests:  [facility] 10/31/11 0447 10/30/11 1848  AST 9 10  ALT 10 12  ALKPHOS 176* 224*  BILITOT 0.1* 0.1*  PROT 6.2 7.4  ALBUMIN 2.2* 2.7*    Basename 10/30/11 1848  LIPASE 20  AMYLASE --   No results found for this basename: AMMONIA:2 in the last 72 hours CBC:  Basename 11/01/11 0615 10/31/11 0447 10/30/11 1848  WBC 11.2* 10.9* --  NEUTROABS -- -- 12.5*  HGB 9.3* 9.1* --  HCT 28.2* 27.2* --  MCV 98.3 98.6 --  PLT 339 352 --   Cardiac Enzymes: No results found for this basename: CKTOTAL:3,CKMB:3,CKMBINDEX:3,TROPONINI:3 in the last 72 hours BNP: No results found for this basename: PROBNP:3 in the last 72 hours D-Dimer: No results found for this basename: DDIMER:2 in the last 72  hours CBG:  Basename 11/01/11 0728 10/31/11 2057 10/31/11 1659 10/31/11 1147 10/31/11 0739  GLUCAP 99 183* 155* 206* 185*   Hemoglobin A1C:  Basename 10/31/11 0447  HGBA1C 7.0*   Fasting Lipid Panel:  Basename 10/31/11 0447  CHOL 146  HDL 42  LDLCALC 63  TRIG 203*  CHOLHDL 3.5  LDLDIRECT --   Thyroid Function Tests:  Basename 10/30/11 2332  TSH 1.046  T4TOTAL --  FREET4 --  T3FREE --  THYROIDAB --   Anemia Panel: No results found for this basename: VITAMINB12,FOLATE,FERRITIN,TIBC,IRON,RETICCTPCT in the last 72 hours Coagulation:  Basename 10/31/11 0447  LABPROT 15.3*  INR 1.18   Urine Drug Screen: Drugs of Abuse     Component Value Date/Time   LABOPIA NONE DETECTED 02/25/2010 0100   COCAINSCRNUR NONE DETECTED 02/25/2010 0100   LABBENZ POSITIVE* 02/25/2010 0100   AMPHETMU NONE DETECTED 02/25/2010 0100   THCU NONE DETECTED 02/25/2010 0100   LABBARB  Value: POSITIVE        DRUG SCREEN FOR MEDICAL PURPOSES ONLY.  IF CONFIRMATION IS NEEDED FOR ANY PURPOSE, NOTIFY LAB WITHIN 5 DAYS.        LOWEST DETECTABLE LIMITS FOR URINE DRUG SCREEN Drug Class       Cutoff (ng/mL) Amphetamine      1000 Barbiturate      200 Benzodiazepine   200 Tricyclics  300 Opiates          300 Cocaine          300 THC              50* 02/25/2010 0100    Alcohol Level: No results found for this basename: ETH:2 in the last 72 hours Urinalysis:  Basename 10/30/11 1944  COLORURINE YELLOW  LABSPEC 1.020  PHURINE 5.0  GLUCOSEU 250*  HGBUR SMALL*  BILIRUBINUR NEGATIVE  KETONESUR NEGATIVE  PROTEINUR 100*  UROBILINOGEN 0.2  NITRITE NEGATIVE  LEUKOCYTESUR MODERATE*    Recent Results (from the past 240 hour(s))  URINE CULTURE     Status: Normal   Collection Time   10/30/11  7:44 PM      Component Value Range Status Comment   Specimen Description URINE, CLEAN CATCH   Final    Special Requests NONE   Final    Culture  Setup Time [id number]   Final    Colony Count >=100,000 COLONIES/ML   Final      Culture ESCHERICHIA COLI   Final    Report Status 11/01/2011 FINAL   Final    Organism ID, Bacteria ESCHERICHIA COLI   Final   MRSA PCR SCREENING     Status: Abnormal   Collection Time   10/31/11 10:08 AM      Component Value Range Status Comment   MRSA by PCR POSITIVE (*) NEGATIVE  Final     Studies/Results: Dg Tibia/fibula Right  10/30/2011  *RADIOLOGY REPORT*  Clinical Data: Soft tissue ulcerations and pain in the right lower leg.  RIGHT TIBIA AND FIBULA - 2 VIEW  Comparison: 10/06/2011  Findings: The tibia and fibula appear normal with no periosteal reaction or other significant abnormality.  Soft tissue ulceration of the distal lateral aspect of the right lower leg is more prominent than on the prior exam.  IMPRESSION: No evidence of osteomyelitis or other acute osseous abnormality.  Original Report Authenticated By: Gwynn Burly, M.D.   Dg Abd Acute W/chest  10/30/2011  *RADIOLOGY REPORT*  Clinical Data: Abdominal pain.  Vomiting.  ACUTE ABDOMEN SERIES (ABDOMEN 2 VIEW & CHEST 1 VIEW)  Comparison: None.  Findings: There is cardiomegaly with a Port-A-Cath in place. There is a small right pleural effusion.  Faint patchy infiltrate or parenchymal scarring at the left lung base laterally.  No free air or free fluid in the abdomen.  The abdomen is essentially devoid of bowel gas.  No dilated loops of bowel. Harrington rods in the lower lumbar spine.  Extensive vascular calcification.  IMPRESSION:  1.  Small right pleural effusion. 2.  Small area of infiltrate or parenchymal scarring at the left lung base. 3.  Cardiomegaly. 4.  No acute abnormalities of the abdomen.  Original Report Authenticated By: Gwynn Burly, M.D.   Dg Foot Complete Right  10/30/2011  *RADIOLOGY REPORT*  Clinical Data: .  Right lower leg and right foot ulcerations. Right lower leg pain.  RIGHT FOOT COMPLETE - 3+ VIEW  Comparison: 10/06/2011  Findings: There is no evidence of osteomyelitis or significant arthritis. No  visible joint effusions or bone destruction.  IMPRESSION: No significant abnormalities.  No change.  Original Report Authenticated By: Gwynn Burly, M.D.    Medications: Scheduled Meds:   . amLODipine  10 mg Oral Daily  . ARIPiprazole  2 mg Oral Daily  . brimonidine  1 drop Right Eye Q12H   And  . timolol  1 drop Right Eye Q12H  .  brinzolamide  1 drop Right Eye TID  . Chlorhexidine Gluconate Cloth  6 each Topical Q0600  . cinacalcet  30 mg Oral Daily  . cloNIDine  0.2 mg Oral BID  . clopidogrel  75 mg Oral Daily  . collagenase   Topical Daily  . feeding supplement (NEPRO CARB STEADY)  237 mL Oral BID  . fluconazole (DIFLUCAN) IV  100 mg Intravenous Q24H  . gatifloxacin  1 drop Right Eye Q6H  .  HYDROmorphone (DILAUDID) injection  1 mg Intramuscular Once  . insulin aspart  0-9 Units Subcutaneous TID WC  . insulin glargine  30 Units Subcutaneous QHS  . lisinopril  40 mg Oral Daily  . loteprednol  1 drop Right Eye QID  . metoCLOPramide (REGLAN) injection  10 mg Intravenous Q6H  . multivitamin  1 tablet Oral Daily  . mupirocin ointment  1 application Nasal BID  . nicotine  14 mg Transdermal Daily  . ofloxacin  1 drop Right Eye TID  . olopatadine  1 drop Right Eye Daily  . pantoprazole  40 mg Oral Q1200  . PARoxetine  80 mg Oral Daily  . piperacillin-tazobactam (ZOSYN)  IV  2.25 g Intravenous Q8H  . polyvinyl alcohol  1 drop Both Eyes QID  . sevelamer  4,000 mg Oral TID WC  . sodium bicarbonate  650 mg Oral BID  . sodium chloride  3 mL Intravenous Q12H  . sucralfate  1 g Oral BID AC  . torsemide  100 mg Oral Daily  . travoprost (benzalkonium)  1 drop Right Eye QHS  . DISCONTD: homatropine  1 drop Right Eye TID  . DISCONTD: metoCLOPramide  10 mg Oral TID AC & HS   Continuous Infusions:  PRN Meds:.sodium chloride, acetaminophen, acetaminophen, albuterol, ALPRAZolam, artificial tears, butalbital-acetaminophen-caffeine, heparin, heparin, oxyCODONE, oxyCODONE-acetaminophen,  promethazine, sevelamer, sodium chloride  Assessment/Plan:  Principal Problem:  *PNA (pneumonia)  The patient was given a dose of Cefipime, Levaquin and Vancomycin in the ER. She was then started on vanco/zosyn.  She is afebrile, not short of breath and appears to be improved, will de-escalate antibiotics.  Active Problems:  Vomiting  Likely secondary to gastroparesis. Improved with Reglan, advance diet .  ESRD on dialysis  Renal following, her dialysis access appears to have clotted off.  She will be sent to [place] today to hopefully declot her access.  If this is not possible then she may need a Diatek catheter placed.  HTN (hypertension), malignant  BP high, but refused oral medications. Continue Norvasc, Clonidine, Lisinopril, Demadex as tolerated. 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 CBG stable. Continue Lantus, SSI for now.   Ulcers of both lower extremities / PVD (peripheral vascular disease)  Seen by Dr. Myra Gianotti of vascular surgery on 10/29/11. Followed in wound care center. Doppler studies were recently obtained which show an ABI of 0.15 on the right and 0.48 on the left. Dr. Myra Gianotti feels that her wounds are limb threatening, with plans to do angiography with possible stenting on 11/06/11. She has been seen by the wound care nurse with plans to use Santyl for chemical debridement. She will need to follow up at the wound center post discharge.   Tobacco abuse  Counseled by admitting MD. Will request tobacco cessation education be provided by nursing staff. Continue nicotine  patch.   Hyperparathyroidism, secondary renal  Continue phosphate binders.   Bipolar 1 disorder  Continue Abilify, PRN Xanax.   Hyponatremia  ? Pseudohyponatremia related to elevated blood glucoses along  with impairment of free water excretion from ESRD. HD should correct.   Normocytic anemia  Mild. Related to CKD.   UTI  E coli, sensitive to rocephin, will adjust antibiotics    LOS: 2 days   Careli Luzader [facility] Pager: [phone] 11/01/2011, 11:08 AM

## 2011-11-01 NOTE — Progress Notes (Signed)
Pt's BP 193/92 at this time. Dr. Kerry Hough paged at [phone]. Awaiting return page. Pt in chair at this time. Asymptomatic. Will continue to monitor.

## 2011-11-02 ENCOUNTER — Ambulatory Visit (HOSPITAL_COMMUNITY): Payer: Medicare Other

## 2011-11-02 ENCOUNTER — Inpatient Hospital Stay (HOSPITAL_COMMUNITY): Payer: Medicare Other

## 2011-11-02 ENCOUNTER — Encounter (HOSPITAL_COMMUNITY): Payer: Self-pay | Admitting: Radiology

## 2011-11-02 LAB — APTT: aPTT: 34 seconds (ref 24–37)

## 2011-11-02 LAB — GLUCOSE, CAPILLARY
Glucose-Capillary: 474 mg/dL — ABNORMAL HIGH (ref 70–99)
Glucose-Capillary: 472 mg/dL — ABNORMAL HIGH (ref 70–99)

## 2011-11-02 LAB — CBC
HCT: 27.7 % — ABNORMAL LOW (ref 36.0–46.0)
MCHC: 33.2 g/dL (ref 30.0–36.0)
Platelets: 347 10*3/uL (ref 150–400)
RDW: 15.6 % — ABNORMAL HIGH (ref 11.5–15.5)
WBC: 9.7 10*3/uL (ref 4.0–10.5)

## 2011-11-02 LAB — PHOSPHORUS: Phosphorus: 5.1 mg/dL — ABNORMAL HIGH (ref 2.3–4.6)

## 2011-11-02 LAB — BASIC METABOLIC PANEL
Calcium: 9.4 mg/dL (ref 8.4–10.5)
Chloride: 84 mEq/L — ABNORMAL LOW (ref 96–112)
Creatinine, Ser: 6.45 mg/dL — ABNORMAL HIGH (ref 0.50–1.10)
GFR calc Af Amer: 8 mL/min — ABNORMAL LOW (ref >90)
GFR calc non Af Amer: 7 mL/min — ABNORMAL LOW (ref >90)

## 2011-11-02 LAB — PROTIME-INR
INR: 1.13 (ref 0.00–1.49)
Prothrombin Time: 14.7 seconds (ref 11.6–15.2)

## 2011-11-02 MED ORDER — ALTEPLASE 100 MG IV SOLR
2.0000 mg | INTRAVENOUS | Status: AC
  Filled 2011-11-02: qty 2

## 2011-11-02 MED ORDER — FENTANYL CITRATE 0.05 MG/ML IJ SOLN
INTRAMUSCULAR | Status: AC
  Filled 2011-11-02: qty 4

## 2011-11-02 MED ORDER — INSULIN ASPART 100 UNIT/ML ~~LOC~~ SOLN
19.0000 [IU] | Freq: Once | SUBCUTANEOUS | Status: AC
  Administered 2011-11-02: 19 [IU] via SUBCUTANEOUS

## 2011-11-02 MED ORDER — ALTEPLASE 100 MG IV SOLR
2.0000 mg | Freq: Once | INTRAVENOUS | Status: DC
  Filled 2011-11-02: qty 2

## 2011-11-02 MED ORDER — ALTEPLASE 100 MG IV SOLR
INTRAVENOUS | Status: AC | PRN
  Administered 2011-11-02: 2 mg

## 2011-11-02 MED ORDER — HEPARIN SODIUM (PORCINE) 1000 UNIT/ML IJ SOLN
INTRAMUSCULAR | Status: AC | PRN
  Administered 2011-11-02: 2000 [IU]

## 2011-11-02 MED ORDER — EPOETIN ALFA 10000 UNIT/ML IJ SOLN
10000.0000 [IU] | INTRAMUSCULAR | Status: DC
  Administered 2011-11-02: 10000 [IU] via INTRAVENOUS
  Filled 2011-11-02 (×3): qty 1

## 2011-11-02 MED ORDER — MIDAZOLAM HCL 5 MG/5ML IJ SOLN
INTRAMUSCULAR | Status: AC | PRN
  Administered 2011-11-02 (×2): 1 mg via INTRAVENOUS

## 2011-11-02 MED ORDER — SODIUM CHLORIDE 0.9 % IJ SOLN
INTRAMUSCULAR | Status: AC
  Administered 2011-11-02: 3 mL
  Filled 2011-11-02: qty 3

## 2011-11-02 MED ORDER — IOHEXOL 300 MG/ML  SOLN
100.0000 mL | Freq: Once | INTRAMUSCULAR | Status: AC | PRN
  Administered 2011-11-02: 60 mL via INTRAVENOUS

## 2011-11-02 MED ORDER — FENTANYL CITRATE 0.05 MG/ML IJ SOLN
INTRAMUSCULAR | Status: AC | PRN
  Administered 2011-11-02 (×2): 25 ug via INTRAVENOUS

## 2011-11-02 MED ORDER — SODIUM CHLORIDE 0.9 % IJ SOLN
INTRAMUSCULAR | Status: AC
  Filled 2011-11-02: qty 3

## 2011-11-02 MED ORDER — MIDAZOLAM HCL 2 MG/2ML IJ SOLN
INTRAMUSCULAR | Status: AC
  Filled 2011-11-02: qty 4

## 2011-11-02 NOTE — ED Notes (Signed)
1102 VS taken at 1100. Dr. Miles Costain aware of BP. More sedation given

## 2011-11-02 NOTE — H&P (Signed)
DEEANNA [name] is an 41 y.o. female.   Chief Complaint: esrd, occluded RUE AVG placed 6/12, here for declot procedure and if needed HD catheter placement HPI: 41 yo female DM, esrd, PVD, with occluded AVG   Past Medical History  Diagnosis Date  . DVT (deep venous thrombosis)     DVT HISTORY  . Renal failure   . Diabetes mellitus   . Migraine     HISTORY  . Anxiety and depression   . CAD (coronary artery disease)   . Bipolar 1 disorder   . Nephrotic syndrome     IN THE PAST  . Hyperlipidemia   . Hyperparathyroidism   . PE (pulmonary embolism)     HISTORY  . Dialysis patient   . Anemia   . Hypertension     Past Surgical History  Procedure Date  . Back surgery X 4  . Cholecystectomy   . Incise and drain abcess     OF THIGHS FROM INSULIN INJECTIONS  . Heart stents     3 - 4  STENTS PLACED  . Portacath placement 2003  . Eye surgery     Family History  Problem Relation Age of Onset  . Transient ischemic attack Mother   . Hepatitis Sister   . Diabetes type II Other    Social History:  reports that she has been smoking Cigarettes.  She has a 40 pack-year smoking history. She has never used smokeless tobacco. She reports that she does not drink alcohol or use illicit drugs.  Allergies:  Allergies  Allergen Reactions  . Compazine (Prochlorperazine) Swelling    Per echart records  . Contrast Media (Iodinated Diagnostic Agents)     Per echart records  . Meperidine Hcl Swelling  . Metformin And Related Other (See Comments)    'just not myself, was told not to take metformin'  . Morphine Swelling  . Nubain (Nalbuphine Hcl) Swelling  . Topiramate (Topamax) Swelling    Tongue swelling - per echart records  . Toradol (Ketorolac Tromethamine) Swelling      Results for orders placed during the hospital encounter of 10/30/11 (from the past 48 hour(s))  MRSA PCR SCREENING     Status: Abnormal   Collection Time   10/31/11 10:08 AM      Component Value Range Comment   MRSA by PCR POSITIVE (*) NEGATIVE    GLUCOSE, CAPILLARY     Status: Abnormal   Collection Time   10/31/11 11:47 AM      Component Value Range Comment   Glucose-Capillary 206 (*) 70 - 99 (mg/dL)   GLUCOSE, CAPILLARY     Status: Abnormal   Collection Time   10/31/11  4:59 PM      Component Value Range Comment   Glucose-Capillary 155 (*) 70 - 99 (mg/dL)   GLUCOSE, CAPILLARY     Status: Abnormal   Collection Time   10/31/11  8:57 PM      Component Value Range Comment   Glucose-Capillary 183 (*) 70 - 99 (mg/dL)    Comment 1 Notify RN     BASIC METABOLIC PANEL     Status: Abnormal   Collection Time   11/01/11  6:15 AM      Component Value Range Comment   Sodium 132 (*) 135 - 145 (mEq/L)    Potassium 3.8  3.5 - 5.1 (mEq/L)    Chloride 91 (*) 96 - 112 (mEq/L)    CO2 27  19 - 32 (mEq/L)  Glucose, Bld 102 (*) 70 - 99 (mg/dL)    BUN 60 (*) 6 - 23 (mg/dL)    Creatinine, Ser 1.61 (*) 0.50 - 1.10 (mg/dL)    Calcium 09.6  8.4 - 10.5 (mg/dL)    GFR calc non Af Amer 7 (*) >90 (mL/min)    GFR calc Af Amer 8 (*) >90 (mL/min)   CBC     Status: Abnormal   Collection Time   11/01/11  6:15 AM      Component Value Range Comment   WBC 11.2 (*) 4.0 - 10.5 (K/uL)    RBC 2.87 (*) 3.87 - 5.11 (MIL/uL)    Hemoglobin 9.3 (*) 12.0 - 15.0 (g/dL)    HCT 04.5 (*) 40.9 - 46.0 (%)    MCV 98.3  78.0 - 100.0 (fL)    MCH 32.4  26.0 - 34.0 (pg)    MCHC 33.0  30.0 - 36.0 (g/dL)    RDW 81.1 (*) 91.4 - 15.5 (%)    Platelets 339  150 - 400 (K/uL)   GLUCOSE, CAPILLARY     Status: Normal   Collection Time   11/01/11  7:28 AM      Component Value Range Comment   Glucose-Capillary 99  70 - 99 (mg/dL)   GLUCOSE, CAPILLARY     Status: Abnormal   Collection Time   11/01/11 11:32 AM      Component Value Range Comment   Glucose-Capillary 100 (*) 70 - 99 (mg/dL)   GLUCOSE, CAPILLARY     Status: Abnormal   Collection Time   11/01/11  4:30 PM      Component Value Range Comment   Glucose-Capillary 271 (*) 70 - 99 (mg/dL)     GLUCOSE, CAPILLARY     Status: Abnormal   Collection Time   11/01/11  9:41 PM      Component Value Range Comment   Glucose-Capillary 353 (*) 70 - 99 (mg/dL)    Comment 1 Documented in Chart      Comment 2 Notify RN     BASIC METABOLIC PANEL     Status: Abnormal   Collection Time   11/02/11  5:12 AM      Component Value Range Comment   Sodium 124 (*) 135 - 145 (mEq/L) DELTA CHECK NOTED   Potassium 4.6  3.5 - 5.1 (mEq/L)    Chloride 84 (*) 96 - 112 (mEq/L)    CO2 24  19 - 32 (mEq/L)    Glucose, Bld 582 (*) 70 - 99 (mg/dL)    BUN 65 (*) 6 - 23 (mg/dL)    Creatinine, Ser 7.82 (*) 0.50 - 1.10 (mg/dL)    Calcium 9.4  8.4 - 10.5 (mg/dL)    GFR calc non Af Amer 7 (*) >90 (mL/min)    GFR calc Af Amer 8 (*) >90 (mL/min)   CBC     Status: Abnormal   Collection Time   11/02/11  5:12 AM      Component Value Range Comment   WBC 9.7  4.0 - 10.5 (K/uL)    RBC 2.78 (*) 3.87 - 5.11 (MIL/uL)    Hemoglobin 9.2 (*) 12.0 - 15.0 (g/dL)    HCT 95.6 (*) 21.3 - 46.0 (%)    MCV 99.6  78.0 - 100.0 (fL)    MCH 33.1  26.0 - 34.0 (pg)    MCHC 33.2  30.0 - 36.0 (g/dL)    RDW 08.6 (*) 57.8 - 15.5 (%)    Platelets 347  150 - 400 (K/uL)   PROTIME-INR     Status: Normal   Collection Time   11/02/11  5:12 AM      Component Value Range Comment   Prothrombin Time 14.7  11.6 - 15.2 (seconds)    INR 1.13  0.00 - 1.49    APTT     Status: Normal   Collection Time   11/02/11  5:12 AM      Component Value Range Comment   aPTT 34  24 - 37 (seconds)   PHOSPHORUS     Status: Abnormal   Collection Time   11/02/11  5:12 AM      Component Value Range Comment   Phosphorus 5.1 (*) 2.3 - 4.6 (mg/dL)   GLUCOSE, CAPILLARY     Status: Abnormal   Collection Time   11/02/11  7:45 AM      Component Value Range Comment   Glucose-Capillary 474 (*) 70 - 99 (mg/dL)    Comment 1 Notify RN      Comment 2 Documented in Chart      No results found.  Review of Systems  Constitutional: Negative for fever and chills.  Respiratory:  Negative for cough and shortness of breath.   Cardiovascular: Negative for chest pain and palpitations.    Blood pressure 203/95, pulse 80, temperature 98.2 F (36.8 C), temperature source Oral, resp. rate 20, height 5\' 10"  (1.778 m), weight 184 lb 3.1 oz (83.55 kg), SpO2 92.00%. Physical Exam  Constitutional: She appears well-developed and well-nourished.  Cardiovascular: Normal rate and regular rhythm.   Respiratory: Effort normal and breath sounds normal.  GI: Soft. Bowel sounds are normal.     Assessment/Plan Occluded RUE AVG for declot porcedure if unsuccessfully HD catheter placement   Athina Fahey T. 11/02/2011, 9:35 AM

## 2011-11-02 NOTE — Procedures (Signed)
Successful RUE AVG DECLOT WITH 7mm pta and 7 x 4 flair stent of venous anastomosis No comp Stable Ready to HD

## 2011-11-02 NOTE — ED Notes (Signed)
Report called to RN Jeani Hawking, Angelica Chessman. Left with transport [facility] to be taken back to [facility]. Dressings left arm clean dry and intact.

## 2011-11-02 NOTE — Progress Notes (Signed)
Subjective: Patient feels better, nausea and vomiting have improved, no cough or shortness of breath.  Patient went to [facility] today to have AVG declotted, returned to [facility] and was in dialysis all day today, complaining of back pain and arm pain from laying down on stretcher all day for transport and procedure.  Objective: Vital signs in last 24 hours: Temp:  [97.8 F (36.6 C)-98.2 F (36.8 C)] 97.9 F (36.6 C) (03/08 1815) Pulse Rate:  [63-92] 76  (03/08 1815) Resp:  [12-98] 20  (03/08 1815) BP: (124-227)/(67-99) 148/81 mmHg (03/08 1815) SpO2:  [16 %-98 %] 92 % (03/08 1815) Weight:  [80.5 kg (177 lb 7.5 oz)-83.553 kg (184 lb 3.2 oz)] 80.5 kg (177 lb 7.5 oz) (03/08 1815) Weight change:  Last BM Date: 10/31/11  Intake/Output from previous day: 03/07 0701 - 03/08 0700 In: 229 [P.O.:120; I.V.:3; IV Piggyback:106] Out: -  Total I/O In: -  Out: 3000 [Other:3000]   Physical Exam: General: Alert, awake, oriented x3, in no acute distress. HEENT: No bruits, no goiter. Heart: Regular rate and rhythm, without murmurs, rubs, gallops. Lungs: Clear to auscultation bilaterally. Abdomen: Soft, nontender, nondistended, positive bowel sounds. Extremities: wrapped in ace bandage due to wounds Neuro: Grossly intact, nonfocal.    Lab Results: Basic Metabolic Panel:  Basename 11/02/11 0512 11/01/11 0615  NA 124* 132*  K 4.6 3.8  CL 84* 91*  CO2 24 27  GLUCOSE 582* 102*  BUN 65* 60*  CREATININE 6.45* 6.55*  CALCIUM 9.4 10.2  MG -- --  PHOS 5.1* --   Liver Function Tests:  [facility] 10/31/11 0447  AST 9  ALT 10  ALKPHOS 176*  BILITOT 0.1*  PROT 6.2  ALBUMIN 2.2*   No results found for this basename: LIPASE:2,AMYLASE:2 in the last 72 hours No results found for this basename: AMMONIA:2 in the last 72 hours CBC:  Basename 11/02/11 0512 11/01/11 0615  WBC 9.7 11.2*  NEUTROABS -- --  HGB 9.2* 9.3*  HCT 27.7* 28.2*  MCV 99.6 98.3  PLT 347 339   Cardiac Enzymes: No results found  for this basename: CKTOTAL:3,CKMB:3,CKMBINDEX:3,TROPONINI:3 in the last 72 hours BNP: No results found for this basename: PROBNP:3 in the last 72 hours D-Dimer: No results found for this basename: DDIMER:2 in the last 72 hours CBG:  Basename 11/02/11 1638 11/02/11 1302 11/02/11 0745 11/01/11 2141 11/01/11 1630 11/01/11 1132  GLUCAP 175* 326* 474* 353* 271* 100*   Hemoglobin A1C:  Basename 10/31/11 0447  HGBA1C 7.0*   Fasting Lipid Panel:  Basename 10/31/11 0447  CHOL 146  HDL 42  LDLCALC 63  TRIG 203*  CHOLHDL 3.5  LDLDIRECT --   Thyroid Function Tests:  Basename 10/30/11 2332  TSH 1.046  T4TOTAL --  FREET4 --  T3FREE --  THYROIDAB --   Anemia Panel: No results found for this basename: VITAMINB12,FOLATE,FERRITIN,TIBC,IRON,RETICCTPCT in the last 72 hours Coagulation:  Basename 11/02/11 0512 10/31/11 0447  LABPROT 14.7 15.3*  INR 1.13 1.18   Urine Drug Screen: Drugs of Abuse     Component Value Date/Time   LABOPIA NONE DETECTED 02/25/2010 0100   COCAINSCRNUR NONE DETECTED 02/25/2010 0100   LABBENZ POSITIVE* 02/25/2010 0100   AMPHETMU NONE DETECTED 02/25/2010 0100   THCU NONE DETECTED 02/25/2010 0100   LABBARB  Value: POSITIVE        DRUG SCREEN FOR MEDICAL PURPOSES ONLY.  IF CONFIRMATION IS NEEDED FOR ANY PURPOSE, NOTIFY LAB WITHIN 5 DAYS.        LOWEST DETECTABLE LIMITS  FOR URINE DRUG SCREEN Drug Class       Cutoff (ng/mL) Amphetamine      1000 Barbiturate      200 Benzodiazepine   200 Tricyclics       300 Opiates          300 Cocaine          300 THC              50* 02/25/2010 0100    Alcohol Level: No results found for this basename: ETH:2 in the last 72 hours Urinalysis:  Basename 10/30/11 1944  COLORURINE YELLOW  LABSPEC 1.020  PHURINE 5.0  GLUCOSEU 250*  HGBUR SMALL*  BILIRUBINUR NEGATIVE  KETONESUR NEGATIVE  PROTEINUR 100*  UROBILINOGEN 0.2  NITRITE NEGATIVE  LEUKOCYTESUR MODERATE*   Recent Results (from the past 240 hour(s))  URINE CULTURE     Status:  Normal   Collection Time   10/30/11  7:44 PM      Component Value Range Status Comment   Specimen Description URINE, CLEAN CATCH   Final    Special Requests NONE   Final    Culture  Setup Time [id number]   Final    Colony Count >=100,000 COLONIES/ML   Final    Culture ESCHERICHIA COLI   Final    Report Status 11/01/2011 FINAL   Final    Organism ID, Bacteria ESCHERICHIA COLI   Final   MRSA PCR SCREENING     Status: Abnormal   Collection Time   10/31/11 10:08 AM      Component Value Range Status Comment   MRSA by PCR POSITIVE (*) NEGATIVE  Final     Studies/Results: Ir Intravas Stent Non Coron,carot,vert,iliac,low Ext Art  11/02/2011  *RADIOLOGY REPORT*  Clinical Data: End-stage renal disease, occluded left upper arm AV graft  ULTRASOUND GUIDANCE VASCULAR ACCESS LEFT UPPER ARM AV GRAFT THROMBOLYSIS/THROMBECTOMY 7 MM VENOUS ANGIOPLASTY 7 X 4 COVERED STENT PLACEMENT OF THE VENOUS ANASTOMOSIS (FLAIR STENT)  Date:  11/02/2011 07:30:00  Radiologist:  Judie Petit. Ruel Favors, M.D.  Medications:  2 mg Versed, 50 mcg Fentanyl, 2 mg TPA, 2000 units heparin  Guidance:  Ultrasound and fluoroscopic  Fluoroscopy time:  8.3 minutes  Sedation time:  43 minutes  Contrast volume:  60 ml Omnipaque-300  Complications:  No immediate  PROCEDURE/FINDINGS:  Informed consent was obtained from the patient following explanation of the procedure, risks, benefits and alternatives. The patient understands, agrees and consents for the procedure. All questions were addressed.  A time out was performed.  Maximal barrier sterile technique utilized including caps, mask, sterile gowns, sterile gloves, large sterile drape, hand hygiene, and betadine  Under sterile conditions and local anesthesia, the left upper arm occluded graft was accessed at 2 separate sites.  6-French sheath initially inserted.  Contrast injection confirms thrombotic occlusion.  Catheter and guide wire advanced across the venous anastomosis.  Outflow venogram confirms  patency of the central veins.  Pullback venogram confirms thrombotic occlusion to the high brachial vein.  2 mg TPA instilled for thrombolysis.  Mechanical thrombectomy performed with the AngioJet device.  7 mm overlapping angioplasty performed of the venous anastomosis and throughout the graft.  Graft inflow reestablished by passing a 5.5 Fogarty across the arterial anastomosis.  Sheaths were back bled and syringe aspirated.  Initial shuntogram performed.  Initial shuntogram:  AV graft is now patent.  Residual thrombus within the graft noted at one of the sheaths.  The venous anastomosis demonstrates diffuse wall irregularity and minor  extravasation related to the angioplasty.  This is somewhat flow limiting.  Additional AngioJet thrombectomy was performed throughout the graft to remove the residual thrombus.  Additional 7 mm overlapping angioplasty was performed of the venous anastomosis.  Following these additional treatments, repeat shuntogram again demonstrates diffuse wall irregularity and contained extravasation at the venous anastomosis.  This did not respond to prolonged 7 mm angioplasty.  Because of this, the access will be at high risk for reocclusion.  Stent placement:  Over the guide wire, one of the sheaths was upsized to a 9-French sheath.  Localization shuntogram performed. To treat the venous anastomotic extravasation siteA, a 7 x 4 covered stent (FLAIR stent) was deployed centered on the venous anastomosis.  This was inflated with a 7 mm balloon.  Following this, a final shuntogram demonstrated wide patency of the shunt and brisk venous outflow into the axillary vein.  No residual stenosis or wall irregularity.  No residual extravasation or leakage.  Sheaths removed.  Hemostasis obtained with pursestring sutures.  No immediate complication.  The patient tolerated the procedure well.  IMPRESSION: Successful left upper arm AV graft thrombolysis, thrombectomy and venous angioplasty to restore flow.   Venous anastomotic angioplasty resulted in high brachial venous injury with a small contained extravasation.  This did not respond or resolve with prolonged venous angioplasty therefore further treatment was performed by placement of a 7 x 4 FLAIR covered stent.  Access ready for use.  Access management:  This access remains amenable intervention.  Original Report Authenticated By: Judie Petit. Ruel Favors, M.D.   Ir Pta Venous Left  11/02/2011  *RADIOLOGY REPORT*  Clinical Data: End-stage renal disease, occluded left upper arm AV graft  ULTRASOUND GUIDANCE VASCULAR ACCESS LEFT UPPER ARM AV GRAFT THROMBOLYSIS/THROMBECTOMY 7 MM VENOUS ANGIOPLASTY 7 X 4 COVERED STENT PLACEMENT OF THE VENOUS ANASTOMOSIS (FLAIR STENT)  Date:  11/02/2011 07:30:00  Radiologist:  Judie Petit. Ruel Favors, M.D.  Medications:  2 mg Versed, 50 mcg Fentanyl, 2 mg TPA, 2000 units heparin  Guidance:  Ultrasound and fluoroscopic  Fluoroscopy time:  8.3 minutes  Sedation time:  43 minutes  Contrast volume:  60 ml Omnipaque-300  Complications:  No immediate  PROCEDURE/FINDINGS:  Informed consent was obtained from the patient following explanation of the procedure, risks, benefits and alternatives. The patient understands, agrees and consents for the procedure. All questions were addressed.  A time out was performed.  Maximal barrier sterile technique utilized including caps, mask, sterile gowns, sterile gloves, large sterile drape, hand hygiene, and betadine  Under sterile conditions and local anesthesia, the left upper arm occluded graft was accessed at 2 separate sites.  6-French sheath initially inserted.  Contrast injection confirms thrombotic occlusion.  Catheter and guide wire advanced across the venous anastomosis.  Outflow venogram confirms patency of the central veins.  Pullback venogram confirms thrombotic occlusion to the high brachial vein.  2 mg TPA instilled for thrombolysis.  Mechanical thrombectomy performed with the AngioJet device.  7 mm  overlapping angioplasty performed of the venous anastomosis and throughout the graft.  Graft inflow reestablished by passing a 5.5 Fogarty across the arterial anastomosis.  Sheaths were back bled and syringe aspirated.  Initial shuntogram performed.  Initial shuntogram:  AV graft is now patent.  Residual thrombus within the graft noted at one of the sheaths.  The venous anastomosis demonstrates diffuse wall irregularity and minor extravasation related to the angioplasty.  This is somewhat flow limiting.  Additional AngioJet thrombectomy was performed throughout the graft to remove the  residual thrombus.  Additional 7 mm overlapping angioplasty was performed of the venous anastomosis.  Following these additional treatments, repeat shuntogram again demonstrates diffuse wall irregularity and contained extravasation at the venous anastomosis.  This did not respond to prolonged 7 mm angioplasty.  Because of this, the access will be at high risk for reocclusion.  Stent placement:  Over the guide wire, one of the sheaths was upsized to a 9-French sheath.  Localization shuntogram performed. To treat the venous anastomotic extravasation siteA, a 7 x 4 covered stent (FLAIR stent) was deployed centered on the venous anastomosis.  This was inflated with a 7 mm balloon.  Following this, a final shuntogram demonstrated wide patency of the shunt and brisk venous outflow into the axillary vein.  No residual stenosis or wall irregularity.  No residual extravasation or leakage.  Sheaths removed.  Hemostasis obtained with pursestring sutures.  No immediate complication.  The patient tolerated the procedure well.  IMPRESSION: Successful left upper arm AV graft thrombolysis, thrombectomy and venous angioplasty to restore flow.  Venous anastomotic angioplasty resulted in high brachial venous injury with a small contained extravasation.  This did not respond or resolve with prolonged venous angioplasty therefore further treatment was  performed by placement of a 7 x 4 FLAIR covered stent.  Access ready for use.  Access management:  This access remains amenable intervention.  Original Report Authenticated By: Judie Petit. Ruel Favors, M.D.   Ir Av Dialysis Graft Declot  11/02/2011  *RADIOLOGY REPORT*  Clinical Data: End-stage renal disease, occluded left upper arm AV graft  ULTRASOUND GUIDANCE VASCULAR ACCESS LEFT UPPER ARM AV GRAFT THROMBOLYSIS/THROMBECTOMY 7 MM VENOUS ANGIOPLASTY 7 X 4 COVERED STENT PLACEMENT OF THE VENOUS ANASTOMOSIS (FLAIR STENT)  Date:  11/02/2011 07:30:00  Radiologist:  Judie Petit. Ruel Favors, M.D.  Medications:  2 mg Versed, 50 mcg Fentanyl, 2 mg TPA, 2000 units heparin  Guidance:  Ultrasound and fluoroscopic  Fluoroscopy time:  8.3 minutes  Sedation time:  43 minutes  Contrast volume:  60 ml Omnipaque-300  Complications:  No immediate  PROCEDURE/FINDINGS:  Informed consent was obtained from the patient following explanation of the procedure, risks, benefits and alternatives. The patient understands, agrees and consents for the procedure. All questions were addressed.  A time out was performed.  Maximal barrier sterile technique utilized including caps, mask, sterile gowns, sterile gloves, large sterile drape, hand hygiene, and betadine  Under sterile conditions and local anesthesia, the left upper arm occluded graft was accessed at 2 separate sites.  6-French sheath initially inserted.  Contrast injection confirms thrombotic occlusion.  Catheter and guide wire advanced across the venous anastomosis.  Outflow venogram confirms patency of the central veins.  Pullback venogram confirms thrombotic occlusion to the high brachial vein.  2 mg TPA instilled for thrombolysis.  Mechanical thrombectomy performed with the AngioJet device.  7 mm overlapping angioplasty performed of the venous anastomosis and throughout the graft.  Graft inflow reestablished by passing a 5.5 Fogarty across the arterial anastomosis.  Sheaths were back bled and syringe  aspirated.  Initial shuntogram performed.  Initial shuntogram:  AV graft is now patent.  Residual thrombus within the graft noted at one of the sheaths.  The venous anastomosis demonstrates diffuse wall irregularity and minor extravasation related to the angioplasty.  This is somewhat flow limiting.  Additional AngioJet thrombectomy was performed throughout the graft to remove the residual thrombus.  Additional 7 mm overlapping angioplasty was performed of the venous anastomosis.  Following these additional treatments, repeat shuntogram again  demonstrates diffuse wall irregularity and contained extravasation at the venous anastomosis.  This did not respond to prolonged 7 mm angioplasty.  Because of this, the access will be at high risk for reocclusion.  Stent placement:  Over the guide wire, one of the sheaths was upsized to a 9-French sheath.  Localization shuntogram performed. To treat the venous anastomotic extravasation siteA, a 7 x 4 covered stent (FLAIR stent) was deployed centered on the venous anastomosis.  This was inflated with a 7 mm balloon.  Following this, a final shuntogram demonstrated wide patency of the shunt and brisk venous outflow into the axillary vein.  No residual stenosis or wall irregularity.  No residual extravasation or leakage.  Sheaths removed.  Hemostasis obtained with pursestring sutures.  No immediate complication.  The patient tolerated the procedure well.  IMPRESSION: Successful left upper arm AV graft thrombolysis, thrombectomy and venous angioplasty to restore flow.  Venous anastomotic angioplasty resulted in high brachial venous injury with a small contained extravasation.  This did not respond or resolve with prolonged venous angioplasty therefore further treatment was performed by placement of a 7 x 4 FLAIR covered stent.  Access ready for use.  Access management:  This access remains amenable intervention.  Original Report Authenticated By: Judie Petit. Ruel Favors, M.D.   Ir Angio  Av Shunt Addl Access  11/02/2011  *RADIOLOGY REPORT*  Clinical Data: End-stage renal disease, occluded left upper arm AV graft  ULTRASOUND GUIDANCE VASCULAR ACCESS LEFT UPPER ARM AV GRAFT THROMBOLYSIS/THROMBECTOMY 7 MM VENOUS ANGIOPLASTY 7 X 4 COVERED STENT PLACEMENT OF THE VENOUS ANASTOMOSIS (FLAIR STENT)  Date:  11/02/2011 07:30:00  Radiologist:  Judie Petit. Ruel Favors, M.D.  Medications:  2 mg Versed, 50 mcg Fentanyl, 2 mg TPA, 2000 units heparin  Guidance:  Ultrasound and fluoroscopic  Fluoroscopy time:  8.3 minutes  Sedation time:  43 minutes  Contrast volume:  60 ml Omnipaque-300  Complications:  No immediate  PROCEDURE/FINDINGS:  Informed consent was obtained from the patient following explanation of the procedure, risks, benefits and alternatives. The patient understands, agrees and consents for the procedure. All questions were addressed.  A time out was performed.  Maximal barrier sterile technique utilized including caps, mask, sterile gowns, sterile gloves, large sterile drape, hand hygiene, and betadine  Under sterile conditions and local anesthesia, the left upper arm occluded graft was accessed at 2 separate sites.  6-French sheath initially inserted.  Contrast injection confirms thrombotic occlusion.  Catheter and guide wire advanced across the venous anastomosis.  Outflow venogram confirms patency of the central veins.  Pullback venogram confirms thrombotic occlusion to the high brachial vein.  2 mg TPA instilled for thrombolysis.  Mechanical thrombectomy performed with the AngioJet device.  7 mm overlapping angioplasty performed of the venous anastomosis and throughout the graft.  Graft inflow reestablished by passing a 5.5 Fogarty across the arterial anastomosis.  Sheaths were back bled and syringe aspirated.  Initial shuntogram performed.  Initial shuntogram:  AV graft is now patent.  Residual thrombus within the graft noted at one of the sheaths.  The venous anastomosis demonstrates diffuse wall  irregularity and minor extravasation related to the angioplasty.  This is somewhat flow limiting.  Additional AngioJet thrombectomy was performed throughout the graft to remove the residual thrombus.  Additional 7 mm overlapping angioplasty was performed of the venous anastomosis.  Following these additional treatments, repeat shuntogram again demonstrates diffuse wall irregularity and contained extravasation at the venous anastomosis.  This did not respond to prolonged 7 mm angioplasty.  Because of this, the access will be at high risk for reocclusion.  Stent placement:  Over the guide wire, one of the sheaths was upsized to a 9-French sheath.  Localization shuntogram performed. To treat the venous anastomotic extravasation siteA, a 7 x 4 covered stent (FLAIR stent) was deployed centered on the venous anastomosis.  This was inflated with a 7 mm balloon.  Following this, a final shuntogram demonstrated wide patency of the shunt and brisk venous outflow into the axillary vein.  No residual stenosis or wall irregularity.  No residual extravasation or leakage.  Sheaths removed.  Hemostasis obtained with pursestring sutures.  No immediate complication.  The patient tolerated the procedure well.  IMPRESSION: Successful left upper arm AV graft thrombolysis, thrombectomy and venous angioplasty to restore flow.  Venous anastomotic angioplasty resulted in high brachial venous injury with a small contained extravasation.  This did not respond or resolve with prolonged venous angioplasty therefore further treatment was performed by placement of a 7 x 4 FLAIR covered stent.  Access ready for use.  Access management:  This access remains amenable intervention.  Original Report Authenticated By: Judie Petit. Ruel Favors, M.D.   Ir US Guide Vasc Access Left  11/02/2011  *RADIOLOGY REPORT*  Clinical Data: End-stage renal disease, occluded left upper arm AV graft  ULTRASOUND GUIDANCE VASCULAR ACCESS LEFT UPPER ARM AV GRAFT  THROMBOLYSIS/THROMBECTOMY 7 MM VENOUS ANGIOPLASTY 7 X 4 COVERED STENT PLACEMENT OF THE VENOUS ANASTOMOSIS (FLAIR STENT)  Date:  11/02/2011 07:30:00  Radiologist:  Judie Petit. Ruel Favors, M.D.  Medications:  2 mg Versed, 50 mcg Fentanyl, 2 mg TPA, 2000 units heparin  Guidance:  Ultrasound and fluoroscopic  Fluoroscopy time:  8.3 minutes  Sedation time:  43 minutes  Contrast volume:  60 ml Omnipaque-300  Complications:  No immediate  PROCEDURE/FINDINGS:  Informed consent was obtained from the patient following explanation of the procedure, risks, benefits and alternatives. The patient understands, agrees and consents for the procedure. All questions were addressed.  A time out was performed.  Maximal barrier sterile technique utilized including caps, mask, sterile gowns, sterile gloves, large sterile drape, hand hygiene, and betadine  Under sterile conditions and local anesthesia, the left upper arm occluded graft was accessed at 2 separate sites.  6-French sheath initially inserted.  Contrast injection confirms thrombotic occlusion.  Catheter and guide wire advanced across the venous anastomosis.  Outflow venogram confirms patency of the central veins.  Pullback venogram confirms thrombotic occlusion to the high brachial vein.  2 mg TPA instilled for thrombolysis.  Mechanical thrombectomy performed with the AngioJet device.  7 mm overlapping angioplasty performed of the venous anastomosis and throughout the graft.  Graft inflow reestablished by passing a 5.5 Fogarty across the arterial anastomosis.  Sheaths were back bled and syringe aspirated.  Initial shuntogram performed.  Initial shuntogram:  AV graft is now patent.  Residual thrombus within the graft noted at one of the sheaths.  The venous anastomosis demonstrates diffuse wall irregularity and minor extravasation related to the angioplasty.  This is somewhat flow limiting.  Additional AngioJet thrombectomy was performed throughout the graft to remove the residual  thrombus.  Additional 7 mm overlapping angioplasty was performed of the venous anastomosis.  Following these additional treatments, repeat shuntogram again demonstrates diffuse wall irregularity and contained extravasation at the venous anastomosis.  This did not respond to prolonged 7 mm angioplasty.  Because of this, the access will be at high risk for reocclusion.  Stent placement:  Over the guide wire, one  of the sheaths was upsized to a 9-French sheath.  Localization shuntogram performed. To treat the venous anastomotic extravasation siteA, a 7 x 4 covered stent (FLAIR stent) was deployed centered on the venous anastomosis.  This was inflated with a 7 mm balloon.  Following this, a final shuntogram demonstrated wide patency of the shunt and brisk venous outflow into the axillary vein.  No residual stenosis or wall irregularity.  No residual extravasation or leakage.  Sheaths removed.  Hemostasis obtained with pursestring sutures.  No immediate complication.  The patient tolerated the procedure well.  IMPRESSION: Successful left upper arm AV graft thrombolysis, thrombectomy and venous angioplasty to restore flow.  Venous anastomotic angioplasty resulted in high brachial venous injury with a small contained extravasation.  This did not respond or resolve with prolonged venous angioplasty therefore further treatment was performed by placement of a 7 x 4 FLAIR covered stent.  Access ready for use.  Access management:  This access remains amenable intervention.  Original Report Authenticated By: Judie Petit. Ruel Favors, M.D.    Medications: Scheduled Meds:   . alteplase  2 mg Intracatheter Once  . alteplase  2 mg Intracatheter STAT  . alteplase  2 mg Intracatheter Once  . amLODipine  10 mg Oral Daily  . ARIPiprazole  2 mg Oral Daily  . brimonidine  1 drop Right Eye Q12H   And  . timolol  1 drop Right Eye Q12H  . brinzolamide  1 drop Right Eye TID  . cefTRIAXone (ROCEPHIN)  IV  1 g Intravenous Q24H  .  Chlorhexidine Gluconate Cloth  6 each Topical Q0600  . cinacalcet  30 mg Oral Daily  . cloNIDine  0.2 mg Oral BID  . clopidogrel  75 mg Oral Daily  . collagenase   Topical Daily  . diphenhydrAMINE  50 mg Oral Once  . epoetin alfa  10,000 Units Intravenous 3 times weekly  . feeding supplement (NEPRO CARB STEADY)  237 mL Oral BID  . gatifloxacin  1 drop Right Eye Q6H  . heparin subcutaneous  5,000 Units Subcutaneous Q8H  . insulin aspart  0-9 Units Subcutaneous TID WC  . insulin aspart  19 Units Subcutaneous Once  . insulin glargine  30 Units Subcutaneous QHS  . lisinopril  40 mg Oral Daily  . loteprednol  1 drop Right Eye QID  . metoCLOPramide (REGLAN) injection  10 mg Intravenous Q6H  . multivitamin  1 tablet Oral Daily  . mupirocin ointment  1 application Nasal BID  . nicotine  14 mg Transdermal Daily  . ofloxacin  1 drop Right Eye TID  . olopatadine  1 drop Right Eye Daily  . pantoprazole  40 mg Oral Q1200  . PARoxetine  80 mg Oral Daily  . polyvinyl alcohol  1 drop Both Eyes QID  . predniSONE  50 mg Oral Once  . predniSONE  50 mg Oral Once  . predniSONE  50 mg Oral Once  . sevelamer  4,000 mg Oral TID WC  . sodium bicarbonate  650 mg Oral BID  . sodium chloride  3 mL Intravenous Q12H  . sodium chloride      . sucralfate  1 g Oral BID AC  . torsemide  100 mg Oral Daily  . travoprost (benzalkonium)  1 drop Right Eye QHS  . DISCONTD: fluconazole (DIFLUCAN) IV  100 mg Intravenous Q24H   Continuous Infusions:  PRN Meds:.sodium chloride, acetaminophen, acetaminophen, albuterol, ALPRAZolam, alteplase, artificial tears, butalbital-acetaminophen-caffeine, fentaNYL, heparin, heparin, heparin, hydrALAZINE, HYDROmorphone (DILAUDID)  injection, iohexol, midazolam, oxyCODONE, oxyCODONE-acetaminophen, promethazine, sevelamer, sodium chloride  Assessment/Plan:  Principal Problem:  *PNA (pneumonia) Active Problems:  Vomiting  ESRD on dialysis  HTN (hypertension), malignant  DM  (diabetes mellitus), type 2 with complications  Ulcers of both lower extremities  PVD (peripheral vascular disease)  Tobacco abuse  Hyperparathyroidism, secondary renal  Bipolar 1 disorder  Hyponatremia  Normocytic anemia  Diabetic leg ulcer  UTI (lower urinary tract infection)  Plan: #1 pneumonia. Patient appears to be significantly improved. She is not febrile, short of breath, coughing. She is on IV antibiotics.  #2. Escherichia coli urinary tract infection. Sensitivities reveal that organism is multidrug resistant. It is sensitive to ceftriaxone. This is day 4 of therapy. We will complete a total of 5 days and is to be discontinued after tomorrow's dose.  #3. End stage renal disease on hemodialysis. Patient's AV graft was then declotted. She will continue hemodialysis per schedule.  #5. Hyponatremia, likely pseudohyponatremia from hyperglycemia. Her blood sugars have since improved.  #6 chronic pain patient is followed at pain clinic in [place].  #7. Peripheral vascular disease with ulcers of the lower extremity. Patient is followed by Dr. Myra Gianotti from vascular surgery. She is scheduled for a procedure for revascularization next week.  #8. Hypertension. Patient is on multiple antihypertensives. Her blood pressure has since improved since we were able to reinitiate dialysis.  #9. Normocytic anemia, likely related to renal disease, stable  #10. Diabetes. Patient was hypoglycemic earlier this morning, since then her blood sugars have improved. Continue to follow.  #11. Nausea and vomiting, possibly secondary to gastroparesis. Improve since patient has been started on Reglan.  #12. Venous access. The patient had a Port-A-Cath placed in approximately 10 years ago for blood draws. She reports for the past month this has not been able to draw blood. We will try cath flow to see if this will open up any clots.  Disposition. The patient can likely be discharged home after her  antibiotic dose tomorrow and followup with dialysis as an outpatient.   LOS: 3 days   Sheily Lineman [facility] Pager: [phone] 11/02/2011, 6:56 PM

## 2011-11-02 NOTE — ED Notes (Signed)
Dr. Miles Costain aware BP [phone]

## 2011-11-02 NOTE — ED Notes (Signed)
Taken to nurses station and report given to [employer] 

## 2011-11-02 NOTE — ED Notes (Addendum)
Unable to flush IV therefore discontinued. Dr. Miles Costain aware and will give meds through access. States she has pain 10/10 Back arm and stomach, chronic. Not distressed and falls asleep. Aware we will give pain meds for procedure.

## 2011-11-03 LAB — BASIC METABOLIC PANEL
CO2: 29 mEq/L (ref 19–32)
Calcium: 9.3 mg/dL (ref 8.4–10.5)
Chloride: 91 mEq/L — ABNORMAL LOW (ref 96–112)
Glucose, Bld: 192 mg/dL — ABNORMAL HIGH (ref 70–99)
Potassium: 3.3 mEq/L — ABNORMAL LOW (ref 3.5–5.1)
Sodium: 133 mEq/L — ABNORMAL LOW (ref 135–145)

## 2011-11-03 MED ORDER — POTASSIUM CHLORIDE CRYS ER 20 MEQ PO TBCR: 40.0000 meq | EXTENDED_RELEASE_TABLET | Freq: Once | ORAL | Status: DC

## 2011-11-03 NOTE — Discharge Summary (Signed)
Physician Discharge Summary  Patient ID: [name] MRN: [id number] DOB/AGE: 11-21-1970 41 y.o. Primary Care Physician:BLUTH,KIRK, MD, MD Admit date: 10/30/2011 Discharge date: 11/03/2011    Discharge Diagnoses:  1. Pneumonia, clinically improved. 2. Escherichia coli UTI with multidrug resistance, sensitive to intravenous ceftriaxone, completed 5 days of therapy. 3. End stage renal disease on hemodialysis. Status post declotting of AV graft. 4. Chronic pain, followed by pain clinic and [facility]. 5. Peripheral vascular disease with ulcers of the lower extremity, followed by vascular surgery. 6. Hypertension. 7. Anemia of chronic kidney disease, stable. 8.Diabetes mellitus. 9. Gastroparesis, improved with metoclopramide.   Medication List  As of 11/03/2011  8:49 AM   TAKE these medications         albuterol 108 (90 BASE) MCG/ACT inhaler   Commonly known as: PROVENTIL HFA;VENTOLIN HFA   Inhale 2 puffs into the lungs every 6 (six) hours as needed. Shortness of breath        ALPRAZolam 1 MG tablet   Commonly known as: XANAX   Take 1 tablet (1 mg total) by mouth 4 (four) times daily as needed for anxiety. For anxiety        amLODipine 10 MG tablet   Commonly known as: NORVASC   Take 10 mg by mouth daily.      ARIPiprazole 2 MG tablet   Commonly known as: ABILIFY   Take 2 mg by mouth daily.      atropine 1 % ophthalmic solution   Place 1 drop into the right eye 4 (four) times daily.      b complex-vitamin c-folic acid 0.8 MG Tabs   Take 0.8 mg by mouth at bedtime.      brimonidine 0.15 % ophthalmic solution   Commonly known as: ALPHAGAN   Place 1 drop into the right eye 2 (two) times daily. **To be used with Timolol 0.5% OPH Solution**      brinzolamide 1 % ophthalmic suspension   Commonly known as: AZOPT   Place 1 drop into the right eye 3 (three) times daily.      butalbital-acetaminophen-caffeine 50-325-40 MG per tablet   Commonly known as: FIORICET, ESGIC   Take 1 tablet by mouth 4 (four) times daily as needed. **Take one tablet up to 4 times daily as needed for acute migraine pain      cinacalcet 30 MG tablet   Commonly known as: SENSIPAR   Take 30 mg by mouth daily. **Take one tablet daily with evening meal**      cloNIDine 0.2 MG tablet   Commonly known as: CATAPRES   Take 0.2 mg by mouth 2 (two) times daily.      clopidogrel 75 MG tablet   Commonly known as: PLAVIX   Take 75 mg by mouth daily.      COMBIGAN 0.2-0.5 % ophthalmic solution   Generic drug: brimonidine-timolol   Place 1 drop into the right eye every 12 (twelve) hours.      homatropine 5 % ophthalmic solution   Place 1 drop into the right eye 3 (three) times daily.      insulin glargine 100 UNIT/ML injection   Commonly known as: LANTUS   Inject 30 Units into the skin at bedtime.      insulin lispro 100 UNIT/ML injection   Commonly known as: HUMALOG   Inject 7-15 Units into the skin 4 (four) times daily -  before meals and at bedtime. TAKING ON SLIDING SCALE  200-249=7 units  250-299=10 units  300-349=12 units  >350=15 units      ketorolac 0.5 % ophthalmic solution   Commonly known as: ACULAR   Place 1 drop into the right eye 3 (three) times daily. For 7 days      lisinopril 40 MG tablet   Commonly known as: PRINIVIL,ZESTRIL   Take 40 mg by mouth daily.      loteprednol 0.5 % ophthalmic suspension   Commonly known as: LOTEMAX   Place 1 drop into the right eye 4 (four) times daily.      metoCLOPramide 10 MG tablet   Commonly known as: REGLAN   TAKE (2) TABLETS FOUR TIMES DAILY.      moxifloxacin 0.5 % ophthalmic solution   Commonly known as: VIGAMOX   Place 1 drop into the right eye 4 (four) times daily.      ofloxacin 0.3 % ophthalmic solution   Commonly known as: OCUFLOX   Place 1 drop into the right eye 3 (three) times daily.      omeprazole 40 MG capsule   Commonly known as: PRILOSEC   Take 1 capsule (40 mg total) by mouth daily before  breakfast. Take (1) Capsule Twice Daily 30 Minutes Before Breakfast and 30 Minutes Before Supper      oxyCODONE-acetaminophen 10-325 MG per tablet   Commonly known as: PERCOCET   Take 1 tablet by mouth every 4 (four) hours. For pain      PARoxetine 40 MG tablet   Commonly known as: PAXIL   Take 80 mg by mouth every morning.      PATADAY 0.2 % Soln   Generic drug: Olopatadine HCl   Apply 1 drop to eye daily.      PHENERGAN 50 MG/ML injection   Generic drug: promethazine   Inject 50 mg into the muscle every 6 (six) hours as needed. For nausea      promethazine 25 MG tablet   Commonly known as: PHENERGAN   TAKE ONE TABLET EVERY SIX HOURS AS NEEDED      REFRESH P.M. OP   Place 1 application into the right eye 4 (four) times daily.      sevelamer 800 MG tablet   Commonly known as: RENVELA   Take 1,600-4,000 mg by mouth 5 (five) times daily. *Take 5 tablets daily with each meal and take 2 tablets with snacks      sodium bicarbonate 650 MG tablet   Take 650 mg by mouth 2 (two) times daily.      sucralfate 1 G tablet   Commonly known as: CARAFATE   Take 1 g by mouth 2 (two) times daily before a meal.      SYSTANE LID WIPES Pads   Place 1 application into the right eye daily.      SYSTANE ULTRA 0.4-0.3 % Soln   Generic drug: Polyethyl Glycol-Propyl Glycol   Place 1 drop into both eyes 4 (four) times daily.      torsemide 100 MG tablet   Commonly known as: DEMADEX   Take 100 mg by mouth daily.      travoprost (benzalkonium) 0.004 % ophthalmic solution   Commonly known as: TRAVATAN   Place 1 drop into the right eye at bedtime.            Discharged Condition: Stable and improved.    Consults: Nephrology, Dr Fausto Skillern.  Significant Diagnostic Studies: Dg Tibia/fibula Right  10/30/2011  *RADIOLOGY REPORT*  Clinical Data: Soft tissue ulcerations and pain in the right lower  leg.  RIGHT TIBIA AND FIBULA - 2 VIEW  Comparison: 10/06/2011  Findings: The tibia and fibula  appear normal with no periosteal reaction or other significant abnormality.  Soft tissue ulceration of the distal lateral aspect of the right lower leg is more prominent than on the prior exam.  IMPRESSION: No evidence of osteomyelitis or other acute osseous abnormality.  Original Report Authenticated By: Gwynn Burly, M.D.   Dg Tibia/fibula Right  10/06/2011  *RADIOLOGY REPORT*  Clinical Data: Distal wound check  RIGHT TIBIA AND FIBULA - 2 VIEW  Comparison: None.  Findings: Two views of the right tibia-fibula submitted.  No acute fracture or subluxation.  There is soft tissue irregularity lateral distal fibular region.  Clinical correlation is necessary.  No periosteal reaction or bony erosion.  IMPRESSION: No acute fracture or subluxation.  No periosteal reaction or bony erosion to suggest osteomyelitis.  Original Report Authenticated By: Natasha Mead, M.D.   Ir Intravas Stent Non Coron,carot,vert,iliac,low Ext Art  11/02/2011  *RADIOLOGY REPORT*  Clinical Data: End-stage renal disease, occluded left upper arm AV graft  ULTRASOUND GUIDANCE VASCULAR ACCESS LEFT UPPER ARM AV GRAFT THROMBOLYSIS/THROMBECTOMY 7 MM VENOUS ANGIOPLASTY 7 X 4 COVERED STENT PLACEMENT OF THE VENOUS ANASTOMOSIS (FLAIR STENT)  Date:  11/02/2011 07:30:00  Radiologist:  Judie Petit. Ruel Favors, M.D.  Medications:  2 mg Versed, 50 mcg Fentanyl, 2 mg TPA, 2000 units heparin  Guidance:  Ultrasound and fluoroscopic  Fluoroscopy time:  8.3 minutes  Sedation time:  43 minutes  Contrast volume:  60 ml Omnipaque-300  Complications:  No immediate  PROCEDURE/FINDINGS:  Informed consent was obtained from the patient following explanation of the procedure, risks, benefits and alternatives. The patient understands, agrees and consents for the procedure. All questions were addressed.  A time out was performed.  Maximal barrier sterile technique utilized including caps, mask, sterile gowns, sterile gloves, large sterile drape, hand hygiene, and betadine  Under  sterile conditions and local anesthesia, the left upper arm occluded graft was accessed at 2 separate sites.  6-French sheath initially inserted.  Contrast injection confirms thrombotic occlusion.  Catheter and guide wire advanced across the venous anastomosis.  Outflow venogram confirms patency of the central veins.  Pullback venogram confirms thrombotic occlusion to the high brachial vein.  2 mg TPA instilled for thrombolysis.  Mechanical thrombectomy performed with the AngioJet device.  7 mm overlapping angioplasty performed of the venous anastomosis and throughout the graft.  Graft inflow reestablished by passing a 5.5 Fogarty across the arterial anastomosis.  Sheaths were back bled and syringe aspirated.  Initial shuntogram performed.  Initial shuntogram:  AV graft is now patent.  Residual thrombus within the graft noted at one of the sheaths.  The venous anastomosis demonstrates diffuse wall irregularity and minor extravasation related to the angioplasty.  This is somewhat flow limiting.  Additional AngioJet thrombectomy was performed throughout the graft to remove the residual thrombus.  Additional 7 mm overlapping angioplasty was performed of the venous anastomosis.  Following these additional treatments, repeat shuntogram again demonstrates diffuse wall irregularity and contained extravasation at the venous anastomosis.  This did not respond to prolonged 7 mm angioplasty.  Because of this, the access will be at high risk for reocclusion.  Stent placement:  Over the guide wire, one of the sheaths was upsized to a 9-French sheath.  Localization shuntogram performed. To treat the venous anastomotic extravasation siteA, a 7 x 4 covered stent (FLAIR stent) was deployed centered on the venous anastomosis.  This was  inflated with a 7 mm balloon.  Following this, a final shuntogram demonstrated wide patency of the shunt and brisk venous outflow into the axillary vein.  No residual stenosis or wall irregularity.  No  residual extravasation or leakage.  Sheaths removed.  Hemostasis obtained with pursestring sutures.  No immediate complication.  The patient tolerated the procedure well.  IMPRESSION: Successful left upper arm AV graft thrombolysis, thrombectomy and venous angioplasty to restore flow.  Venous anastomotic angioplasty resulted in high brachial venous injury with a small contained extravasation.  This did not respond or resolve with prolonged venous angioplasty therefore further treatment was performed by placement of a 7 x 4 FLAIR covered stent.  Access ready for use.  Access management:  This access remains amenable intervention.  Original Report Authenticated By: Judie Petit. Ruel Favors, M.D.   Ir Pta Venous Left  11/02/2011  *RADIOLOGY REPORT*  Clinical Data: End-stage renal disease, occluded left upper arm AV graft  ULTRASOUND GUIDANCE VASCULAR ACCESS LEFT UPPER ARM AV GRAFT THROMBOLYSIS/THROMBECTOMY 7 MM VENOUS ANGIOPLASTY 7 X 4 COVERED STENT PLACEMENT OF THE VENOUS ANASTOMOSIS (FLAIR STENT)  Date:  11/02/2011 07:30:00  Radiologist:  Judie Petit. Ruel Favors, M.D.  Medications:  2 mg Versed, 50 mcg Fentanyl, 2 mg TPA, 2000 units heparin  Guidance:  Ultrasound and fluoroscopic  Fluoroscopy time:  8.3 minutes  Sedation time:  43 minutes  Contrast volume:  60 ml Omnipaque-300  Complications:  No immediate  PROCEDURE/FINDINGS:  Informed consent was obtained from the patient following explanation of the procedure, risks, benefits and alternatives. The patient understands, agrees and consents for the procedure. All questions were addressed.  A time out was performed.  Maximal barrier sterile technique utilized including caps, mask, sterile gowns, sterile gloves, large sterile drape, hand hygiene, and betadine  Under sterile conditions and local anesthesia, the left upper arm occluded graft was accessed at 2 separate sites.  6-French sheath initially inserted.  Contrast injection confirms thrombotic occlusion.  Catheter and guide wire  advanced across the venous anastomosis.  Outflow venogram confirms patency of the central veins.  Pullback venogram confirms thrombotic occlusion to the high brachial vein.  2 mg TPA instilled for thrombolysis.  Mechanical thrombectomy performed with the AngioJet device.  7 mm overlapping angioplasty performed of the venous anastomosis and throughout the graft.  Graft inflow reestablished by passing a 5.5 Fogarty across the arterial anastomosis.  Sheaths were back bled and syringe aspirated.  Initial shuntogram performed.  Initial shuntogram:  AV graft is now patent.  Residual thrombus within the graft noted at one of the sheaths.  The venous anastomosis demonstrates diffuse wall irregularity and minor extravasation related to the angioplasty.  This is somewhat flow limiting.  Additional AngioJet thrombectomy was performed throughout the graft to remove the residual thrombus.  Additional 7 mm overlapping angioplasty was performed of the venous anastomosis.  Following these additional treatments, repeat shuntogram again demonstrates diffuse wall irregularity and contained extravasation at the venous anastomosis.  This did not respond to prolonged 7 mm angioplasty.  Because of this, the access will be at high risk for reocclusion.  Stent placement:  Over the guide wire, one of the sheaths was upsized to a 9-French sheath.  Localization shuntogram performed. To treat the venous anastomotic extravasation siteA, a 7 x 4 covered stent (FLAIR stent) was deployed centered on the venous anastomosis.  This was inflated with a 7 mm balloon.  Following this, a final shuntogram demonstrated wide patency of the shunt and brisk venous outflow into  the axillary vein.  No residual stenosis or wall irregularity.  No residual extravasation or leakage.  Sheaths removed.  Hemostasis obtained with pursestring sutures.  No immediate complication.  The patient tolerated the procedure well.  IMPRESSION: Successful left upper arm AV graft  thrombolysis, thrombectomy and venous angioplasty to restore flow.  Venous anastomotic angioplasty resulted in high brachial venous injury with a small contained extravasation.  This did not respond or resolve with prolonged venous angioplasty therefore further treatment was performed by placement of a 7 x 4 FLAIR covered stent.  Access ready for use.  Access management:  This access remains amenable intervention.  Original Report Authenticated By: Judie Petit. Ruel Favors, M.D.   Ir Av Dialysis Graft Declot  11/02/2011  *RADIOLOGY REPORT*  Clinical Data: End-stage renal disease, occluded left upper arm AV graft  ULTRASOUND GUIDANCE VASCULAR ACCESS LEFT UPPER ARM AV GRAFT THROMBOLYSIS/THROMBECTOMY 7 MM VENOUS ANGIOPLASTY 7 X 4 COVERED STENT PLACEMENT OF THE VENOUS ANASTOMOSIS (FLAIR STENT)  Date:  11/02/2011 07:30:00  Radiologist:  Judie Petit. Ruel Favors, M.D.  Medications:  2 mg Versed, 50 mcg Fentanyl, 2 mg TPA, 2000 units heparin  Guidance:  Ultrasound and fluoroscopic  Fluoroscopy time:  8.3 minutes  Sedation time:  43 minutes  Contrast volume:  60 ml Omnipaque-300  Complications:  No immediate  PROCEDURE/FINDINGS:  Informed consent was obtained from the patient following explanation of the procedure, risks, benefits and alternatives. The patient understands, agrees and consents for the procedure. All questions were addressed.  A time out was performed.  Maximal barrier sterile technique utilized including caps, mask, sterile gowns, sterile gloves, large sterile drape, hand hygiene, and betadine  Under sterile conditions and local anesthesia, the left upper arm occluded graft was accessed at 2 separate sites.  6-French sheath initially inserted.  Contrast injection confirms thrombotic occlusion.  Catheter and guide wire advanced across the venous anastomosis.  Outflow venogram confirms patency of the central veins.  Pullback venogram confirms thrombotic occlusion to the high brachial vein.  2 mg TPA instilled for thrombolysis.   Mechanical thrombectomy performed with the AngioJet device.  7 mm overlapping angioplasty performed of the venous anastomosis and throughout the graft.  Graft inflow reestablished by passing a 5.5 Fogarty across the arterial anastomosis.  Sheaths were back bled and syringe aspirated.  Initial shuntogram performed.  Initial shuntogram:  AV graft is now patent.  Residual thrombus within the graft noted at one of the sheaths.  The venous anastomosis demonstrates diffuse wall irregularity and minor extravasation related to the angioplasty.  This is somewhat flow limiting.  Additional AngioJet thrombectomy was performed throughout the graft to remove the residual thrombus.  Additional 7 mm overlapping angioplasty was performed of the venous anastomosis.  Following these additional treatments, repeat shuntogram again demonstrates diffuse wall irregularity and contained extravasation at the venous anastomosis.  This did not respond to prolonged 7 mm angioplasty.  Because of this, the access will be at high risk for reocclusion.  Stent placement:  Over the guide wire, one of the sheaths was upsized to a 9-French sheath.  Localization shuntogram performed. To treat the venous anastomotic extravasation siteA, a 7 x 4 covered stent (FLAIR stent) was deployed centered on the venous anastomosis.  This was inflated with a 7 mm balloon.  Following this, a final shuntogram demonstrated wide patency of the shunt and brisk venous outflow into the axillary vein.  No residual stenosis or wall irregularity.  No residual extravasation or leakage.  Sheaths removed.  Hemostasis obtained  with pursestring sutures.  No immediate complication.  The patient tolerated the procedure well.  IMPRESSION: Successful left upper arm AV graft thrombolysis, thrombectomy and venous angioplasty to restore flow.  Venous anastomotic angioplasty resulted in high brachial venous injury with a small contained extravasation.  This did not respond or resolve with  prolonged venous angioplasty therefore further treatment was performed by placement of a 7 x 4 FLAIR covered stent.  Access ready for use.  Access management:  This access remains amenable intervention.  Original Report Authenticated By: Judie Petit. Ruel Favors, M.D.   Ir Angio Av Shunt Addl Access  11/02/2011  *RADIOLOGY REPORT*  Clinical Data: End-stage renal disease, occluded left upper arm AV graft  ULTRASOUND GUIDANCE VASCULAR ACCESS LEFT UPPER ARM AV GRAFT THROMBOLYSIS/THROMBECTOMY 7 MM VENOUS ANGIOPLASTY 7 X 4 COVERED STENT PLACEMENT OF THE VENOUS ANASTOMOSIS (FLAIR STENT)  Date:  11/02/2011 07:30:00  Radiologist:  Judie Petit. Ruel Favors, M.D.  Medications:  2 mg Versed, 50 mcg Fentanyl, 2 mg TPA, 2000 units heparin  Guidance:  Ultrasound and fluoroscopic  Fluoroscopy time:  8.3 minutes  Sedation time:  43 minutes  Contrast volume:  60 ml Omnipaque-300  Complications:  No immediate  PROCEDURE/FINDINGS:  Informed consent was obtained from the patient following explanation of the procedure, risks, benefits and alternatives. The patient understands, agrees and consents for the procedure. All questions were addressed.  A time out was performed.  Maximal barrier sterile technique utilized including caps, mask, sterile gowns, sterile gloves, large sterile drape, hand hygiene, and betadine  Under sterile conditions and local anesthesia, the left upper arm occluded graft was accessed at 2 separate sites.  6-French sheath initially inserted.  Contrast injection confirms thrombotic occlusion.  Catheter and guide wire advanced across the venous anastomosis.  Outflow venogram confirms patency of the central veins.  Pullback venogram confirms thrombotic occlusion to the high brachial vein.  2 mg TPA instilled for thrombolysis.  Mechanical thrombectomy performed with the AngioJet device.  7 mm overlapping angioplasty performed of the venous anastomosis and throughout the graft.  Graft inflow reestablished by passing a 5.5 Fogarty across  the arterial anastomosis.  Sheaths were back bled and syringe aspirated.  Initial shuntogram performed.  Initial shuntogram:  AV graft is now patent.  Residual thrombus within the graft noted at one of the sheaths.  The venous anastomosis demonstrates diffuse wall irregularity and minor extravasation related to the angioplasty.  This is somewhat flow limiting.  Additional AngioJet thrombectomy was performed throughout the graft to remove the residual thrombus.  Additional 7 mm overlapping angioplasty was performed of the venous anastomosis.  Following these additional treatments, repeat shuntogram again demonstrates diffuse wall irregularity and contained extravasation at the venous anastomosis.  This did not respond to prolonged 7 mm angioplasty.  Because of this, the access will be at high risk for reocclusion.  Stent placement:  Over the guide wire, one of the sheaths was upsized to a 9-French sheath.  Localization shuntogram performed. To treat the venous anastomotic extravasation siteA, a 7 x 4 covered stent (FLAIR stent) was deployed centered on the venous anastomosis.  This was inflated with a 7 mm balloon.  Following this, a final shuntogram demonstrated wide patency of the shunt and brisk venous outflow into the axillary vein.  No residual stenosis or wall irregularity.  No residual extravasation or leakage.  Sheaths removed.  Hemostasis obtained with pursestring sutures.  No immediate complication.  The patient tolerated the procedure well.  IMPRESSION: Successful left upper arm AV  graft thrombolysis, thrombectomy and venous angioplasty to restore flow.  Venous anastomotic angioplasty resulted in high brachial venous injury with a small contained extravasation.  This did not respond or resolve with prolonged venous angioplasty therefore further treatment was performed by placement of a 7 x 4 FLAIR covered stent.  Access ready for use.  Access management:  This access remains amenable intervention.   Original Report Authenticated By: Judie Petit. Ruel Favors, M.D.   Ir US Guide Vasc Access Left  11/02/2011  *RADIOLOGY REPORT*  Clinical Data: End-stage renal disease, occluded left upper arm AV graft  ULTRASOUND GUIDANCE VASCULAR ACCESS LEFT UPPER ARM AV GRAFT THROMBOLYSIS/THROMBECTOMY 7 MM VENOUS ANGIOPLASTY 7 X 4 COVERED STENT PLACEMENT OF THE VENOUS ANASTOMOSIS (FLAIR STENT)  Date:  11/02/2011 07:30:00  Radiologist:  Judie Petit. Ruel Favors, M.D.  Medications:  2 mg Versed, 50 mcg Fentanyl, 2 mg TPA, 2000 units heparin  Guidance:  Ultrasound and fluoroscopic  Fluoroscopy time:  8.3 minutes  Sedation time:  43 minutes  Contrast volume:  60 ml Omnipaque-300  Complications:  No immediate  PROCEDURE/FINDINGS:  Informed consent was obtained from the patient following explanation of the procedure, risks, benefits and alternatives. The patient understands, agrees and consents for the procedure. All questions were addressed.  A time out was performed.  Maximal barrier sterile technique utilized including caps, mask, sterile gowns, sterile gloves, large sterile drape, hand hygiene, and betadine  Under sterile conditions and local anesthesia, the left upper arm occluded graft was accessed at 2 separate sites.  6-French sheath initially inserted.  Contrast injection confirms thrombotic occlusion.  Catheter and guide wire advanced across the venous anastomosis.  Outflow venogram confirms patency of the central veins.  Pullback venogram confirms thrombotic occlusion to the high brachial vein.  2 mg TPA instilled for thrombolysis.  Mechanical thrombectomy performed with the AngioJet device.  7 mm overlapping angioplasty performed of the venous anastomosis and throughout the graft.  Graft inflow reestablished by passing a 5.5 Fogarty across the arterial anastomosis.  Sheaths were back bled and syringe aspirated.  Initial shuntogram performed.  Initial shuntogram:  AV graft is now patent.  Residual thrombus within the graft noted at one of the  sheaths.  The venous anastomosis demonstrates diffuse wall irregularity and minor extravasation related to the angioplasty.  This is somewhat flow limiting.  Additional AngioJet thrombectomy was performed throughout the graft to remove the residual thrombus.  Additional 7 mm overlapping angioplasty was performed of the venous anastomosis.  Following these additional treatments, repeat shuntogram again demonstrates diffuse wall irregularity and contained extravasation at the venous anastomosis.  This did not respond to prolonged 7 mm angioplasty.  Because of this, the access will be at high risk for reocclusion.  Stent placement:  Over the guide wire, one of the sheaths was upsized to a 9-French sheath.  Localization shuntogram performed. To treat the venous anastomotic extravasation siteA, a 7 x 4 covered stent (FLAIR stent) was deployed centered on the venous anastomosis.  This was inflated with a 7 mm balloon.  Following this, a final shuntogram demonstrated wide patency of the shunt and brisk venous outflow into the axillary vein.  No residual stenosis or wall irregularity.  No residual extravasation or leakage.  Sheaths removed.  Hemostasis obtained with pursestring sutures.  No immediate complication.  The patient tolerated the procedure well.  IMPRESSION: Successful left upper arm AV graft thrombolysis, thrombectomy and venous angioplasty to restore flow.  Venous anastomotic angioplasty resulted in high brachial venous injury with a  small contained extravasation.  This did not respond or resolve with prolonged venous angioplasty therefore further treatment was performed by placement of a 7 x 4 FLAIR covered stent.  Access ready for use.  Access management:  This access remains amenable intervention.  Original Report Authenticated By: Judie Petit. Ruel Favors, M.D.   Dg Abd Acute W/chest  10/30/2011  *RADIOLOGY REPORT*  Clinical Data: Abdominal pain.  Vomiting.  ACUTE ABDOMEN SERIES (ABDOMEN 2 VIEW & CHEST 1 VIEW)   Comparison: None.  Findings: There is cardiomegaly with a Port-A-Cath in place. There is a small right pleural effusion.  Faint patchy infiltrate or parenchymal scarring at the left lung base laterally.  No free air or free fluid in the abdomen.  The abdomen is essentially devoid of bowel gas.  No dilated loops of bowel. Harrington rods in the lower lumbar spine.  Extensive vascular calcification.  IMPRESSION:  1.  Small right pleural effusion. 2.  Small area of infiltrate or parenchymal scarring at the left lung base. 3.  Cardiomegaly. 4.  No acute abnormalities of the abdomen.  Original Report Authenticated By: Gwynn Burly, M.D.   Dg Foot Complete Right  10/30/2011  *RADIOLOGY REPORT*  Clinical Data: .  Right lower leg and right foot ulcerations. Right lower leg pain.  RIGHT FOOT COMPLETE - 3+ VIEW  Comparison: 10/06/2011  Findings: There is no evidence of osteomyelitis or significant arthritis. No visible joint effusions or bone destruction.  IMPRESSION: No significant abnormalities.  No change.  Original Report Authenticated By: Gwynn Burly, M.D.   Dg Foot Complete Right  10/06/2011  *RADIOLOGY REPORT*  Clinical Data: wound check  RIGHT FOOT COMPLETE - 3+ VIEW  Comparison: 02/04/2010  Findings: Three views of the right foot submitted.  No acute fracture or subluxation.  No periosteal reaction or bony erosion.  IMPRESSION: No acute fracture or subluxation.  No definite evidence of osteomyelitis.  Original Report Authenticated By: Natasha Mead, M.D.    Lab Results: Basic Metabolic Panel:  Basename 11/03/11 0429 11/02/11 2137 11/02/11 0512  NA 133* -- 124*  K 3.3* -- 4.6  CL 91* -- 84*  CO2 29 -- 24  GLUCOSE 192* 468* --  BUN 28* -- 65*  CREATININE 3.52* -- 6.45*  CALCIUM 9.3 -- 9.4  MG -- -- --  PHOS -- -- 5.1*       CBC:  Basename 11/02/11 0512 11/01/11 0615  WBC 9.7 11.2*  NEUTROABS -- --  HGB 9.2* 9.3*  HCT 27.7* 28.2*  MCV 99.6 98.3  PLT 347 339    Recent Results  (from the past 240 hour(s))  URINE CULTURE     Status: Normal   Collection Time   10/30/11  7:44 PM      Component Value Range Status Comment   Specimen Description URINE, CLEAN CATCH   Final    Special Requests NONE   Final    Culture  Setup Time [id number]   Final    Colony Count >=100,000 COLONIES/ML   Final    Culture ESCHERICHIA COLI   Final    Report Status 11/01/2011 FINAL   Final    Organism ID, Bacteria ESCHERICHIA COLI   Final   MRSA PCR SCREENING     Status: Abnormal   Collection Time   10/31/11 10:08 AM      Component Value Range Status Comment   MRSA by PCR POSITIVE (*) NEGATIVE  Final      Hospital Course: This 41 year old lady was admitted  with symptoms of vomiting, abdominal pain and cough. It was felt that she had gastroparesis and also chest x-ray showed probable pneumonia at the left lung base. Urine culture was also done which showed Escherichia coli with multidrug resistance. The Escherichia coli was sensitive to ceftriaxone she was started on intravenous ceftriaxone. She will complete her fifth day today which should be sufficient. She was seen by nephrology, Dr Fausto Skillern, who has been giving her dialysis. Yesterday she went to [facility] and had declotting of the left upper arm AV graft and flow was restored. A stent had to be placed. She feels much improved today and wishes to go home.  Discharge Exam: Blood pressure 157/74, pulse 76, temperature 97.7 F (36.5 C), temperature source Oral, resp. rate 18, height 5\' 10"  (1.778 m), weight 83.779 kg (184 lb 11.2 oz), last menstrual period 09/03/2008, SpO2 94.00%. She looks systemically well. She is not toxic or septic. Heart sounds are present and normal. Lung fields shows few wheezes which I think are her baseline. There is no bronchial breathing. There are no crackles. She is alert and orientated.  Disposition: Home. She will follow with her nephrologist and primary care physician.  Discharge Orders    Future  Orders Please Complete By Expires   Diet - low sodium heart healthy      Increase activity slowly         Follow-up Information    Follow up with Ernestine Conrad, MD. (As needed)       Follow up with [facility]. (When DC'd, call [facility] to resume [facility] RN and Aide [phone])          SignedWilson Singer Pager [phone]  11/03/2011, 8:49 AM

## 2011-11-03 NOTE — Progress Notes (Signed)
Subjective: Interval History: has no complaint of nausea or vomiting. Patient denies any difficulty in breathing. Overall patient seems to be feeling better..  Objective: Vital signs in last 24 hours: Temp:  [97.7 F (36.5 C)-98.7 F (37.1 C)] 97.7 F (36.5 C) (03/09 0553) Pulse Rate:  [63-92] 76  (03/09 0553) Resp:  [12-98] 18  (03/09 0553) BP: (124-227)/(67-99) 157/74 mmHg (03/09 0553) SpO2:  [16 %-98 %] 94 % (03/09 0553) Weight:  [80.5 kg (177 lb 7.5 oz)-83.779 kg (184 lb 11.2 oz)] 83.779 kg (184 lb 11.2 oz) (03/09 0553) Weight change: 3.15 kg (6 lb 15.1 oz)  Intake/Output from previous day: 03/08 0701 - 03/09 0700 In: 240 [P.O.:240] Out: 3000  Intake/Output this shift:    General appearance: alert, cooperative and no distress Resp: clear to auscultation bilaterally Cardio: regular rate and rhythm, S1, S2 normal, no murmur, click, rub or gallop GI: soft, non-tender; bowel sounds normal; no masses,  no organomegaly Extremities: extremities normal, atraumatic, no cyanosis or edema  Lab Results:  [facility] 11/02/11 0512 11/01/11 0615  WBC 9.7 11.2*  HGB 9.2* 9.3*  HCT 27.7* 28.2*  PLT 347 339   BMET:  Basename 11/03/11 0429 11/02/11 2137 11/02/11 0512  NA 133* -- 124*  K 3.3* -- 4.6  CL 91* -- 84*  CO2 29 -- 24  GLUCOSE 192* 468* --  BUN 28* -- 65*  CREATININE 3.52* -- 6.45*  CALCIUM 9.3 -- 9.4   No results found for this basename: PTH:2 in the last 72 hours Iron Studies: No results found for this basename: IRON,TIBC,TRANSFERRIN,FERRITIN in the last 72 hours  Studies/Results: Ir Intravas Stent Non Coron,carot,vert,iliac,low Ext Art  11/02/2011  *RADIOLOGY REPORT*  Clinical Data: End-stage renal disease, occluded left upper arm AV graft  ULTRASOUND GUIDANCE VASCULAR ACCESS LEFT UPPER ARM AV GRAFT THROMBOLYSIS/THROMBECTOMY 7 MM VENOUS ANGIOPLASTY 7 X 4 COVERED STENT PLACEMENT OF THE VENOUS ANASTOMOSIS (FLAIR STENT)  Date:  11/02/2011 07:30:00  Radiologist:  Judie Petit. Ruel Favors, M.D.  Medications:  2 mg Versed, 50 mcg Fentanyl, 2 mg TPA, 2000 units heparin  Guidance:  Ultrasound and fluoroscopic  Fluoroscopy time:  8.3 minutes  Sedation time:  43 minutes  Contrast volume:  60 ml Omnipaque-300  Complications:  No immediate  PROCEDURE/FINDINGS:  Informed consent was obtained from the patient following explanation of the procedure, risks, benefits and alternatives. The patient understands, agrees and consents for the procedure. All questions were addressed.  A time out was performed.  Maximal barrier sterile technique utilized including caps, mask, sterile gowns, sterile gloves, large sterile drape, hand hygiene, and betadine  Under sterile conditions and local anesthesia, the left upper arm occluded graft was accessed at 2 separate sites.  6-French sheath initially inserted.  Contrast injection confirms thrombotic occlusion.  Catheter and guide wire advanced across the venous anastomosis.  Outflow venogram confirms patency of the central veins.  Pullback venogram confirms thrombotic occlusion to the high brachial vein.  2 mg TPA instilled for thrombolysis.  Mechanical thrombectomy performed with the AngioJet device.  7 mm overlapping angioplasty performed of the venous anastomosis and throughout the graft.  Graft inflow reestablished by passing a 5.5 Fogarty across the arterial anastomosis.  Sheaths were back bled and syringe aspirated.  Initial shuntogram performed.  Initial shuntogram:  AV graft is now patent.  Residual thrombus within the graft noted at one of the sheaths.  The venous anastomosis demonstrates diffuse wall irregularity and minor extravasation related to the angioplasty.  This is somewhat flow  limiting.  Additional AngioJet thrombectomy was performed throughout the graft to remove the residual thrombus.  Additional 7 mm overlapping angioplasty was performed of the venous anastomosis.  Following these additional treatments, repeat shuntogram again demonstrates diffuse  wall irregularity and contained extravasation at the venous anastomosis.  This did not respond to prolonged 7 mm angioplasty.  Because of this, the access will be at high risk for reocclusion.  Stent placement:  Over the guide wire, one of the sheaths was upsized to a 9-French sheath.  Localization shuntogram performed. To treat the venous anastomotic extravasation siteA, a 7 x 4 covered stent (FLAIR stent) was deployed centered on the venous anastomosis.  This was inflated with a 7 mm balloon.  Following this, a final shuntogram demonstrated wide patency of the shunt and brisk venous outflow into the axillary vein.  No residual stenosis or wall irregularity.  No residual extravasation or leakage.  Sheaths removed.  Hemostasis obtained with pursestring sutures.  No immediate complication.  The patient tolerated the procedure well.  IMPRESSION: Successful left upper arm AV graft thrombolysis, thrombectomy and venous angioplasty to restore flow.  Venous anastomotic angioplasty resulted in high brachial venous injury with a small contained extravasation.  This did not respond or resolve with prolonged venous angioplasty therefore further treatment was performed by placement of a 7 x 4 FLAIR covered stent.  Access ready for use.  Access management:  This access remains amenable intervention.  Original Report Authenticated By: Judie Petit. Ruel Favors, M.D.   Ir Pta Venous Left  11/02/2011  *RADIOLOGY REPORT*  Clinical Data: End-stage renal disease, occluded left upper arm AV graft  ULTRASOUND GUIDANCE VASCULAR ACCESS LEFT UPPER ARM AV GRAFT THROMBOLYSIS/THROMBECTOMY 7 MM VENOUS ANGIOPLASTY 7 X 4 COVERED STENT PLACEMENT OF THE VENOUS ANASTOMOSIS (FLAIR STENT)  Date:  11/02/2011 07:30:00  Radiologist:  Judie Petit. Ruel Favors, M.D.  Medications:  2 mg Versed, 50 mcg Fentanyl, 2 mg TPA, 2000 units heparin  Guidance:  Ultrasound and fluoroscopic  Fluoroscopy time:  8.3 minutes  Sedation time:  43 minutes  Contrast volume:  60 ml  Omnipaque-300  Complications:  No immediate  PROCEDURE/FINDINGS:  Informed consent was obtained from the patient following explanation of the procedure, risks, benefits and alternatives. The patient understands, agrees and consents for the procedure. All questions were addressed.  A time out was performed.  Maximal barrier sterile technique utilized including caps, mask, sterile gowns, sterile gloves, large sterile drape, hand hygiene, and betadine  Under sterile conditions and local anesthesia, the left upper arm occluded graft was accessed at 2 separate sites.  6-French sheath initially inserted.  Contrast injection confirms thrombotic occlusion.  Catheter and guide wire advanced across the venous anastomosis.  Outflow venogram confirms patency of the central veins.  Pullback venogram confirms thrombotic occlusion to the high brachial vein.  2 mg TPA instilled for thrombolysis.  Mechanical thrombectomy performed with the AngioJet device.  7 mm overlapping angioplasty performed of the venous anastomosis and throughout the graft.  Graft inflow reestablished by passing a 5.5 Fogarty across the arterial anastomosis.  Sheaths were back bled and syringe aspirated.  Initial shuntogram performed.  Initial shuntogram:  AV graft is now patent.  Residual thrombus within the graft noted at one of the sheaths.  The venous anastomosis demonstrates diffuse wall irregularity and minor extravasation related to the angioplasty.  This is somewhat flow limiting.  Additional AngioJet thrombectomy was performed throughout the graft to remove the residual thrombus.  Additional 7 mm overlapping angioplasty was performed  of the venous anastomosis.  Following these additional treatments, repeat shuntogram again demonstrates diffuse wall irregularity and contained extravasation at the venous anastomosis.  This did not respond to prolonged 7 mm angioplasty.  Because of this, the access will be at high risk for reocclusion.  Stent placement:   Over the guide wire, one of the sheaths was upsized to a 9-French sheath.  Localization shuntogram performed. To treat the venous anastomotic extravasation siteA, a 7 x 4 covered stent (FLAIR stent) was deployed centered on the venous anastomosis.  This was inflated with a 7 mm balloon.  Following this, a final shuntogram demonstrated wide patency of the shunt and brisk venous outflow into the axillary vein.  No residual stenosis or wall irregularity.  No residual extravasation or leakage.  Sheaths removed.  Hemostasis obtained with pursestring sutures.  No immediate complication.  The patient tolerated the procedure well.  IMPRESSION: Successful left upper arm AV graft thrombolysis, thrombectomy and venous angioplasty to restore flow.  Venous anastomotic angioplasty resulted in high brachial venous injury with a small contained extravasation.  This did not respond or resolve with prolonged venous angioplasty therefore further treatment was performed by placement of a 7 x 4 FLAIR covered stent.  Access ready for use.  Access management:  This access remains amenable intervention.  Original Report Authenticated By: Judie Petit. Ruel Favors, M.D.   Ir Av Dialysis Graft Declot  11/02/2011  *RADIOLOGY REPORT*  Clinical Data: End-stage renal disease, occluded left upper arm AV graft  ULTRASOUND GUIDANCE VASCULAR ACCESS LEFT UPPER ARM AV GRAFT THROMBOLYSIS/THROMBECTOMY 7 MM VENOUS ANGIOPLASTY 7 X 4 COVERED STENT PLACEMENT OF THE VENOUS ANASTOMOSIS (FLAIR STENT)  Date:  11/02/2011 07:30:00  Radiologist:  Judie Petit. Ruel Favors, M.D.  Medications:  2 mg Versed, 50 mcg Fentanyl, 2 mg TPA, 2000 units heparin  Guidance:  Ultrasound and fluoroscopic  Fluoroscopy time:  8.3 minutes  Sedation time:  43 minutes  Contrast volume:  60 ml Omnipaque-300  Complications:  No immediate  PROCEDURE/FINDINGS:  Informed consent was obtained from the patient following explanation of the procedure, risks, benefits and alternatives. The patient understands,  agrees and consents for the procedure. All questions were addressed.  A time out was performed.  Maximal barrier sterile technique utilized including caps, mask, sterile gowns, sterile gloves, large sterile drape, hand hygiene, and betadine  Under sterile conditions and local anesthesia, the left upper arm occluded graft was accessed at 2 separate sites.  6-French sheath initially inserted.  Contrast injection confirms thrombotic occlusion.  Catheter and guide wire advanced across the venous anastomosis.  Outflow venogram confirms patency of the central veins.  Pullback venogram confirms thrombotic occlusion to the high brachial vein.  2 mg TPA instilled for thrombolysis.  Mechanical thrombectomy performed with the AngioJet device.  7 mm overlapping angioplasty performed of the venous anastomosis and throughout the graft.  Graft inflow reestablished by passing a 5.5 Fogarty across the arterial anastomosis.  Sheaths were back bled and syringe aspirated.  Initial shuntogram performed.  Initial shuntogram:  AV graft is now patent.  Residual thrombus within the graft noted at one of the sheaths.  The venous anastomosis demonstrates diffuse wall irregularity and minor extravasation related to the angioplasty.  This is somewhat flow limiting.  Additional AngioJet thrombectomy was performed throughout the graft to remove the residual thrombus.  Additional 7 mm overlapping angioplasty was performed of the venous anastomosis.  Following these additional treatments, repeat shuntogram again demonstrates diffuse wall irregularity and contained extravasation at the venous  anastomosis.  This did not respond to prolonged 7 mm angioplasty.  Because of this, the access will be at high risk for reocclusion.  Stent placement:  Over the guide wire, one of the sheaths was upsized to a 9-French sheath.  Localization shuntogram performed. To treat the venous anastomotic extravasation siteA, a 7 x 4 covered stent (FLAIR stent) was deployed  centered on the venous anastomosis.  This was inflated with a 7 mm balloon.  Following this, a final shuntogram demonstrated wide patency of the shunt and brisk venous outflow into the axillary vein.  No residual stenosis or wall irregularity.  No residual extravasation or leakage.  Sheaths removed.  Hemostasis obtained with pursestring sutures.  No immediate complication.  The patient tolerated the procedure well.  IMPRESSION: Successful left upper arm AV graft thrombolysis, thrombectomy and venous angioplasty to restore flow.  Venous anastomotic angioplasty resulted in high brachial venous injury with a small contained extravasation.  This did not respond or resolve with prolonged venous angioplasty therefore further treatment was performed by placement of a 7 x 4 FLAIR covered stent.  Access ready for use.  Access management:  This access remains amenable intervention.  Original Report Authenticated By: Judie Petit. Ruel Favors, M.D.   Ir Angio Av Shunt Addl Access  11/02/2011  *RADIOLOGY REPORT*  Clinical Data: End-stage renal disease, occluded left upper arm AV graft  ULTRASOUND GUIDANCE VASCULAR ACCESS LEFT UPPER ARM AV GRAFT THROMBOLYSIS/THROMBECTOMY 7 MM VENOUS ANGIOPLASTY 7 X 4 COVERED STENT PLACEMENT OF THE VENOUS ANASTOMOSIS (FLAIR STENT)  Date:  11/02/2011 07:30:00  Radiologist:  Judie Petit. Ruel Favors, M.D.  Medications:  2 mg Versed, 50 mcg Fentanyl, 2 mg TPA, 2000 units heparin  Guidance:  Ultrasound and fluoroscopic  Fluoroscopy time:  8.3 minutes  Sedation time:  43 minutes  Contrast volume:  60 ml Omnipaque-300  Complications:  No immediate  PROCEDURE/FINDINGS:  Informed consent was obtained from the patient following explanation of the procedure, risks, benefits and alternatives. The patient understands, agrees and consents for the procedure. All questions were addressed.  A time out was performed.  Maximal barrier sterile technique utilized including caps, mask, sterile gowns, sterile gloves, large sterile  drape, hand hygiene, and betadine  Under sterile conditions and local anesthesia, the left upper arm occluded graft was accessed at 2 separate sites.  6-French sheath initially inserted.  Contrast injection confirms thrombotic occlusion.  Catheter and guide wire advanced across the venous anastomosis.  Outflow venogram confirms patency of the central veins.  Pullback venogram confirms thrombotic occlusion to the high brachial vein.  2 mg TPA instilled for thrombolysis.  Mechanical thrombectomy performed with the AngioJet device.  7 mm overlapping angioplasty performed of the venous anastomosis and throughout the graft.  Graft inflow reestablished by passing a 5.5 Fogarty across the arterial anastomosis.  Sheaths were back bled and syringe aspirated.  Initial shuntogram performed.  Initial shuntogram:  AV graft is now patent.  Residual thrombus within the graft noted at one of the sheaths.  The venous anastomosis demonstrates diffuse wall irregularity and minor extravasation related to the angioplasty.  This is somewhat flow limiting.  Additional AngioJet thrombectomy was performed throughout the graft to remove the residual thrombus.  Additional 7 mm overlapping angioplasty was performed of the venous anastomosis.  Following these additional treatments, repeat shuntogram again demonstrates diffuse wall irregularity and contained extravasation at the venous anastomosis.  This did not respond to prolonged 7 mm angioplasty.  Because of this, the access will be at high  risk for reocclusion.  Stent placement:  Over the guide wire, one of the sheaths was upsized to a 9-French sheath.  Localization shuntogram performed. To treat the venous anastomotic extravasation siteA, a 7 x 4 covered stent (FLAIR stent) was deployed centered on the venous anastomosis.  This was inflated with a 7 mm balloon.  Following this, a final shuntogram demonstrated wide patency of the shunt and brisk venous outflow into the axillary vein.  No  residual stenosis or wall irregularity.  No residual extravasation or leakage.  Sheaths removed.  Hemostasis obtained with pursestring sutures.  No immediate complication.  The patient tolerated the procedure well.  IMPRESSION: Successful left upper arm AV graft thrombolysis, thrombectomy and venous angioplasty to restore flow.  Venous anastomotic angioplasty resulted in high brachial venous injury with a small contained extravasation.  This did not respond or resolve with prolonged venous angioplasty therefore further treatment was performed by placement of a 7 x 4 FLAIR covered stent.  Access ready for use.  Access management:  This access remains amenable intervention.  Original Report Authenticated By: Judie Petit. Ruel Favors, M.D.   Ir US Guide Vasc Access Left  11/02/2011  *RADIOLOGY REPORT*  Clinical Data: End-stage renal disease, occluded left upper arm AV graft  ULTRASOUND GUIDANCE VASCULAR ACCESS LEFT UPPER ARM AV GRAFT THROMBOLYSIS/THROMBECTOMY 7 MM VENOUS ANGIOPLASTY 7 X 4 COVERED STENT PLACEMENT OF THE VENOUS ANASTOMOSIS (FLAIR STENT)  Date:  11/02/2011 07:30:00  Radiologist:  Judie Petit. Ruel Favors, M.D.  Medications:  2 mg Versed, 50 mcg Fentanyl, 2 mg TPA, 2000 units heparin  Guidance:  Ultrasound and fluoroscopic  Fluoroscopy time:  8.3 minutes  Sedation time:  43 minutes  Contrast volume:  60 ml Omnipaque-300  Complications:  No immediate  PROCEDURE/FINDINGS:  Informed consent was obtained from the patient following explanation of the procedure, risks, benefits and alternatives. The patient understands, agrees and consents for the procedure. All questions were addressed.  A time out was performed.  Maximal barrier sterile technique utilized including caps, mask, sterile gowns, sterile gloves, large sterile drape, hand hygiene, and betadine  Under sterile conditions and local anesthesia, the left upper arm occluded graft was accessed at 2 separate sites.  6-French sheath initially inserted.  Contrast injection  confirms thrombotic occlusion.  Catheter and guide wire advanced across the venous anastomosis.  Outflow venogram confirms patency of the central veins.  Pullback venogram confirms thrombotic occlusion to the high brachial vein.  2 mg TPA instilled for thrombolysis.  Mechanical thrombectomy performed with the AngioJet device.  7 mm overlapping angioplasty performed of the venous anastomosis and throughout the graft.  Graft inflow reestablished by passing a 5.5 Fogarty across the arterial anastomosis.  Sheaths were back bled and syringe aspirated.  Initial shuntogram performed.  Initial shuntogram:  AV graft is now patent.  Residual thrombus within the graft noted at one of the sheaths.  The venous anastomosis demonstrates diffuse wall irregularity and minor extravasation related to the angioplasty.  This is somewhat flow limiting.  Additional AngioJet thrombectomy was performed throughout the graft to remove the residual thrombus.  Additional 7 mm overlapping angioplasty was performed of the venous anastomosis.  Following these additional treatments, repeat shuntogram again demonstrates diffuse wall irregularity and contained extravasation at the venous anastomosis.  This did not respond to prolonged 7 mm angioplasty.  Because of this, the access will be at high risk for reocclusion.  Stent placement:  Over the guide wire, one of the sheaths was upsized to a 9-French sheath.  Localization shuntogram performed. To treat the venous anastomotic extravasation siteA, a 7 x 4 covered stent (FLAIR stent) was deployed centered on the venous anastomosis.  This was inflated with a 7 mm balloon.  Following this, a final shuntogram demonstrated wide patency of the shunt and brisk venous outflow into the axillary vein.  No residual stenosis or wall irregularity.  No residual extravasation or leakage.  Sheaths removed.  Hemostasis obtained with pursestring sutures.  No immediate complication.  The patient tolerated the procedure  well.  IMPRESSION: Successful left upper arm AV graft thrombolysis, thrombectomy and venous angioplasty to restore flow.  Venous anastomotic angioplasty resulted in high brachial venous injury with a small contained extravasation.  This did not respond or resolve with prolonged venous angioplasty therefore further treatment was performed by placement of a 7 x 4 FLAIR covered stent.  Access ready for use.  Access management:  This access remains amenable intervention.  Original Report Authenticated By: Judie Petit. Ruel Favors, M.D.    I have reviewed the patient'[name] current medications.  Assessment/Plan: Problem #1 end-stage renal disease she status post hemodialysis yesterday her BUN and creatinine is stable. Patient potassium is 3.3 slightly low. At this moment her appetite is good.  Problem #2 history of anasarca that has improved  Problem #3 history of diabetes Problem #4 history of hypertension her blood pressure seems to be stable Problem #5 history of bipolar disorder Problem #6 history of secondary hyperparathyroidism Problem #7 history of anemia this is due to end-stage renal disease patient is on Epogen. Problem #8 history of ulcers of her  extremities secondary to peripheral vascular disease. Presently patient is a febrile and her white blood cell count is normal. Problem #9 history of  declotting of her left arm fistula at this moment seems to be working.  Plan: Patient to get dialysis on Monday if she is going to be discharged today. We'll continue his other medications as before.   LOS: 4 days   [name] 11/03/2011,9:50 AM

## 2011-11-05 ENCOUNTER — Telehealth (HOSPITAL_COMMUNITY): Payer: Self-pay | Admitting: *Deleted

## 2011-11-05 NOTE — Telephone Encounter (Signed)
Post procedure follow up call.  Pt unavailable spoke with sister says doing ok with declot.

## 2011-11-06 ENCOUNTER — Ambulatory Visit (HOSPITAL_COMMUNITY)
Admission: RE | Admit: 2011-11-06 | Discharge: 2011-11-06 | Disposition: A | Discharge status: Home / Self Care | Payer: Medicare Other | Source: Ambulatory Visit | Attending: Surgery | Admitting: Surgery

## 2011-11-06 ENCOUNTER — Other Ambulatory Visit: Payer: Self-pay | Admitting: *Deleted

## 2011-11-06 ENCOUNTER — Encounter (HOSPITAL_COMMUNITY)
Admission: RE | Disposition: A | Discharge status: Home / Self Care | Payer: Self-pay | Source: Ambulatory Visit | Attending: Surgery

## 2011-11-06 DIAGNOSIS — I2510 Atherosclerotic heart disease of native coronary artery without angina pectoris: Secondary | ICD-10-CM | POA: Insufficient documentation

## 2011-11-06 DIAGNOSIS — Z01818 Encounter for other preprocedural examination: Secondary | ICD-10-CM

## 2011-11-06 DIAGNOSIS — N186 End stage renal disease: Secondary | ICD-10-CM | POA: Insufficient documentation

## 2011-11-06 DIAGNOSIS — I739 Peripheral vascular disease, unspecified: Secondary | ICD-10-CM | POA: Insufficient documentation

## 2011-11-06 DIAGNOSIS — E119 Type 2 diabetes mellitus without complications: Secondary | ICD-10-CM | POA: Insufficient documentation

## 2011-11-06 DIAGNOSIS — Z992 Dependence on renal dialysis: Secondary | ICD-10-CM | POA: Insufficient documentation

## 2011-11-06 DIAGNOSIS — Z7902 Long term (current) use of antithrombotics/antiplatelets: Secondary | ICD-10-CM | POA: Insufficient documentation

## 2011-11-06 DIAGNOSIS — Z9861 Coronary angioplasty status: Secondary | ICD-10-CM | POA: Insufficient documentation

## 2011-11-06 DIAGNOSIS — I7092 Chronic total occlusion of artery of the extremities: Secondary | ICD-10-CM

## 2011-11-06 DIAGNOSIS — L98499 Non-pressure chronic ulcer of skin of other sites with unspecified severity: Secondary | ICD-10-CM | POA: Insufficient documentation

## 2011-11-06 DIAGNOSIS — Z86718 Personal history of other venous thrombosis and embolism: Secondary | ICD-10-CM | POA: Insufficient documentation

## 2011-11-06 DIAGNOSIS — I120 Hypertensive chronic kidney disease with stage 5 chronic kidney disease or end stage renal disease: Secondary | ICD-10-CM | POA: Insufficient documentation

## 2011-11-06 HISTORY — PX: ABDOMINAL AORTAGRAM: SHX5454

## 2011-11-06 HISTORY — PX: LOWER EXTREMITY ANGIOGRAM: SHX5508

## 2011-11-06 LAB — POCT I-STAT, CHEM 8
Calcium, Ion: 1.24 mmol/L (ref 1.12–1.32)
Glucose, Bld: 249 mg/dL — ABNORMAL HIGH (ref 70–99)
HCT: 29 % — ABNORMAL LOW (ref 36.0–46.0)
Hemoglobin: 9.9 g/dL — ABNORMAL LOW (ref 12.0–15.0)
TCO2: 26 mmol/L (ref 0–100)

## 2011-11-06 LAB — GLUCOSE, CAPILLARY
Glucose-Capillary: 250 mg/dL — ABNORMAL HIGH (ref 70–99)
Glucose-Capillary: 298 mg/dL — ABNORMAL HIGH (ref 70–99)

## 2011-11-06 SURGERY — ABDOMINAL AORTAGRAM
Anesthesia: LOCAL

## 2011-11-06 MED ORDER — INSULIN REGULAR HUMAN 100 UNIT/ML IJ SOLN: 8.0000 [IU] | Freq: Once | INTRAMUSCULAR | Status: DC

## 2011-11-06 MED ORDER — DIPHENHYDRAMINE HCL 50 MG/ML IJ SOLN
25.0000 mg | Freq: Once | INTRAMUSCULAR | Status: AC
  Administered 2011-11-06: 25 mg via INTRAVENOUS

## 2011-11-06 MED ORDER — FAMOTIDINE IN NACL 20-0.9 MG/50ML-% IV SOLN
INTRAVENOUS | Status: AC
  Administered 2011-11-06: 20 mg via INTRAVENOUS
  Filled 2011-11-06: qty 50

## 2011-11-06 MED ORDER — ACETAMINOPHEN 325 MG PO TABS: 650.0000 mg | ORAL_TABLET | ORAL | Status: DC | PRN

## 2011-11-06 MED ORDER — FENTANYL CITRATE 0.05 MG/ML IJ SOLN
50.0000 ug | Freq: Once | INTRAMUSCULAR | Status: AC
  Administered 2011-11-06: 50 ug via INTRAVENOUS

## 2011-11-06 MED ORDER — FENTANYL CITRATE 0.05 MG/ML IJ SOLN
25.0000 ug | Freq: Once | INTRAMUSCULAR | Status: AC
  Administered 2011-11-06: 13:00:00 via INTRAVENOUS

## 2011-11-06 MED ORDER — HYDRALAZINE HCL 20 MG/ML IJ SOLN
INTRAMUSCULAR | Status: AC
  Filled 2011-11-06: qty 1

## 2011-11-06 MED ORDER — FAMOTIDINE IN NACL 20-0.9 MG/50ML-% IV SOLN
20.0000 mg | Freq: Once | INTRAVENOUS | Status: AC
  Administered 2011-11-06: 20 mg via INTRAVENOUS

## 2011-11-06 MED ORDER — HEPARIN (PORCINE) IN NACL 2-0.9 UNIT/ML-% IJ SOLN
INTRAMUSCULAR | Status: AC
  Filled 2011-11-06: qty 2000

## 2011-11-06 MED ORDER — SODIUM CHLORIDE 0.9 % IJ SOLN: 3.0000 mL | INTRAMUSCULAR | Status: DC | PRN

## 2011-11-06 MED ORDER — SODIUM CHLORIDE 0.9 % IV SOLN: 250.0000 mL | INTRAVENOUS | Status: DC | PRN

## 2011-11-06 MED ORDER — SODIUM CHLORIDE 0.9 % IJ SOLN: 3.0000 mL | Freq: Two times a day (BID) | INTRAMUSCULAR | Status: DC

## 2011-11-06 MED ORDER — ONDANSETRON HCL 4 MG/2ML IJ SOLN: 4.0000 mg | Freq: Four times a day (QID) | INTRAMUSCULAR | Status: DC | PRN

## 2011-11-06 MED ORDER — METHYLPREDNISOLONE SODIUM SUCC 125 MG IJ SOLR
125.0000 mg | Freq: Once | INTRAMUSCULAR | Status: AC
  Administered 2011-11-06: 125 mg via INTRAVENOUS

## 2011-11-06 MED ORDER — DIPHENHYDRAMINE HCL 50 MG/ML IJ SOLN
INTRAMUSCULAR | Status: AC
  Administered 2011-11-06: 25 mg via INTRAVENOUS
  Filled 2011-11-06: qty 1

## 2011-11-06 MED ORDER — LIDOCAINE HCL (PF) 1 % IJ SOLN
INTRAMUSCULAR | Status: AC
  Filled 2011-11-06: qty 60

## 2011-11-06 MED ORDER — SODIUM CHLORIDE 0.9 % IV SOLN: INTRAVENOUS | Status: DC

## 2011-11-06 MED ORDER — MIDAZOLAM HCL 2 MG/2ML IJ SOLN
INTRAMUSCULAR | Status: AC
  Filled 2011-11-06: qty 2

## 2011-11-06 MED ORDER — FENTANYL CITRATE 0.05 MG/ML IJ SOLN
INTRAMUSCULAR | Status: AC
  Filled 2011-11-06: qty 2

## 2011-11-06 MED ORDER — INSULIN REGULAR HUMAN 100 UNIT/ML IJ SOLN
8.0000 [IU] | Freq: Once | INTRAMUSCULAR | Status: DC
  Administered 2011-11-06: 8 [IU] via SUBCUTANEOUS

## 2011-11-06 MED ORDER — METHYLPREDNISOLONE SODIUM SUCC 125 MG IJ SOLR
INTRAMUSCULAR | Status: AC
  Administered 2011-11-06: 125 mg via INTRAVENOUS
  Filled 2011-11-06: qty 2

## 2011-11-06 NOTE — H&P (View-Only) (Signed)
[facility] [place]   Patient name: [name] MRN: [id number] DOB: 09-Feb-1971 Sex: female   Referred by: Mills Koller, M.D.  Reason for referral:  Chief Complaint  Patient presents with  . New Evaluation    Ischemic ulcer Righ leg- Ref by Dr. Marcha Solders- [place] exam 10/25/11 [place]    HISTORY OF PRESENT ILLNESS: The patient presents today for evaluation of right leg ulceration. She has 2 ulcers on her right leg the more distal one has been there since April 2012. The more proximal one just below the knee has been there 6-7 weeks. She's been followed in the wound center for approximately one month. Doppler studies were recently obtained which show an ABI of 0.15 on the right and 0.48 on the left breast the left is 58 it could not be calculated on the right. She is sent today for vascular evaluation.  The patient suffers from diabetes. She states her blood sugars are in the 160-400 range. She does not know what her hemoglobin A1c is. She does suffer from end-stage renal disease and dialyzes on Tuesday Thursday Saturday. She is also medically managed for hypertension hyperlipidemia. She is a history of coronary artery disease and is currently on Plavix. She has a history of a left leg deep vein thrombosis. She's had multiple cardiac stents placed.  Past Medical History  Diagnosis Date  . DVT (deep venous thrombosis)     DVT HISTORY  . Renal failure   . Diabetes mellitus   . Migraine     HISTORY  . Anxiety and depression   . CAD (coronary artery disease)   . Bipolar 1 disorder   . Nephrotic syndrome     IN THE PAST  . Hyperlipidemia   . Hyperparathyroidism   . PE (pulmonary embolism)     HISTORY  . Dialysis patient   . Anemia   . Hypertension     Past Surgical History  Procedure Date  . Back surgery X 4  . Cholecystectomy   . Incise and drain abcess     OF THIGHS FROM INSULIN INJECTIONS  . Heart stents     3 - 4  STENTS PLACED  . Portacath  placement 2003  . Eye surgery     History   Social History  . Marital Status: Divorced    Spouse Name: N/A    Number of Children: N/A  . Years of Education: N/A   Occupational History  . Not on file.   Social History Main Topics  . Smoking status: Current Everyday Smoker -- 2.0 packs/day for 20 years    Types: Cigarettes  . Smokeless tobacco: Never Used  . Alcohol Use: No  . Drug Use: No  . Sexually Active: No   Other Topics Concern  . Not on file   Social History Narrative  . No narrative on file    History reviewed. No pertinent family history.  Allergies as of 10/29/2011 - Review Complete 10/29/2011  Allergen Reaction Noted  . Compazine (prochlorperazine) Swelling 06/25/2011  . Contrast media (iodinated diagnostic agents)  06/25/2011  . Meperidine hcl Swelling   . Metformin and related Other (See Comments) 08/20/2011  . Morphine Swelling   . Nubain (nalbuphine hcl) Swelling 04/15/2011  . Topiramate (topamax) Swelling 06/25/2011  . Toradol (ketorolac tromethamine) Swelling 04/15/2011    Current Outpatient Prescriptions on File Prior to Visit  Medication Sig Dispense Refill  . albuterol (PROVENTIL HFA;VENTOLIN HFA) 108 (90 BASE) MCG/ACT inhaler Inhale 2  puffs into the lungs every 6 (six) hours as needed. Shortness of breath        . ALPRAZolam (XANAX) 1 MG tablet Take 1 tablet (1 mg total) by mouth 4 (four) times daily as needed for anxiety. For anxiety   30 tablet  0  . amLODipine (NORVASC) 10 MG tablet Take 10 mg by mouth daily.        . ARIPiprazole (ABILIFY) 2 MG tablet Take 2 mg by mouth daily.        . Artificial Tear Ointment (REFRESH P.M. OP) Place 1 application into the right eye 5 (five) times daily.      Marland Kitchen atropine 1 % ophthalmic solution Place 1 drop into the right eye 4 (four) times daily.        Marland Kitchen b complex-vitamin c-folic acid (NEPHRO-VITE) 0.8 MG TABS Take 0.8 mg by mouth at bedtime.        . brimonidine-timolol (COMBIGAN) 0.2-0.5 % ophthalmic  solution Place 1 drop into the right eye every 12 (twelve) hours.      . butalbital-acetaminophen-caffeine (FIORICET, ESGIC) 50-325-40 MG per tablet Take 1 tablet by mouth every 6 (six) hours as needed. For pain      . cinacalcet (SENSIPAR) 30 MG tablet Take 30 mg by mouth daily.        . cloNIDine (CATAPRES) 0.2 MG tablet Take 0.2 mg by mouth 2 (two) times daily.       . clopidogrel (PLAVIX) 75 MG tablet Take 75 mg by mouth daily.        . Eyelid Cleansers (SYSTANE LID WIPES) PADS Place 1 application into the right eye daily.      . insulin glargine (LANTUS) 100 UNIT/ML injection Inject 30 Units into the skin at bedtime.      . insulin lispro (HUMALOG) 100 UNIT/ML injection Inject 7-15 Units into the skin 4 (four) times daily -  before meals and at bedtime. TAKING ON SLIDING SCALE 200-249=7 units 250-299=10 units 300-349=12 units >350=15 units      . lisinopril (PRINIVIL,ZESTRIL) 40 MG tablet Take 40 mg by mouth daily.        Marland Kitchen loteprednol (LOTEMAX) 0.5 % ophthalmic suspension Place 1 drop into the right eye 4 (four) times daily.      . metoCLOPramide (REGLAN) 10 MG tablet TAKE (2) TABLETS FOUR TIMES DAILY.  240 tablet  1  . ofloxacin (OCUFLOX) 0.3 % ophthalmic solution Place 1 drop into the right eye 4 (four) times daily.      Marland Kitchen omeprazole (PRILOSEC) 40 MG capsule Take 1 capsule (40 mg total) by mouth daily before breakfast. Take (1) Capsule Twice Daily 30 Minutes Before Breakfast and 30 Minutes Before Supper  30 capsule  5  . oxyCODONE-acetaminophen (PERCOCET) 10-325 MG per tablet Take 1 tablet by mouth every 4 (four) hours. For pain      . PARoxetine (PAXIL) 40 MG tablet Take 80 mg by mouth every morning.       Bertram Gala Glycol-Propyl Glycol (SYSTANE ULTRA) 0.4-0.3 % SOLN Place 1 drop into both eyes 4 (four) times daily.      . promethazine (PHENERGAN) 25 MG tablet TAKE ONE TABLET EVERY SIX HOURS AS NEEDED  120 tablet  0  . promethazine (PHENERGAN) 50 MG/ML injection Inject 50 mg into the  muscle every 6 (six) hours as needed. For nausea      . sevelamer (RENVELA) 800 MG tablet Take 4,000 mg by mouth 3 (three) times daily with meals.       Marland Kitchen  sodium bicarbonate 650 MG tablet Take 650 mg by mouth 2 (two) times daily.        . sucralfate (CARAFATE) 1 G tablet Take 1 g by mouth 2 (two) times daily before a meal.       . torsemide (DEMADEX) 100 MG tablet Take 100 mg by mouth daily.        . homatropine 5 % ophthalmic solution Place 1 drop into the right eye 3 (three) times daily.        Marland Kitchen loteprednol (LOTEMAX) 0.2 % SUSP Place 1 drop into the right eye 4 (four) times daily.           REVIEW OF SYSTEMS: Positive for pain in her legs with walking pain or even lying flat, history of DVT, leg swelling pulmonary is positive for productive cough and wheezing. Neurologic positive weakness in her arms and legs, numbness in arms or legs, temporary loss of vision, dizziness. Site is positive history depression constitutional positive for fever and chills. Other review of systems are negative as documented encounter for   PHYSICAL EXAMINATION: General: The patient appears their stated age.  Vital signs are BP 98/49  Pulse 76  Resp 16  Ht 5\' 10"  (1.778 m)  Wt 180 lb (81.647 kg)  BMI 25.83 kg/m2  SpO2 97% HEENT:  No gross abnormalities Pulmonary: Respirations are non-labored Abdomen: Soft and non-tender  Musculoskeletal: There are no major deformities.   Neurologic: No focal weakness or paresthesias are detected, Skin: There are no ulcer or rashes noted. Psychiatric: The patient has normal affect. Cardiovascular: Palpable left femoral pulse. I cannot palpate a right femoral pulse. Pedal pulses are nonpalpable. She has significant bilateral lower extremity edema  Diagnostic Studies: Outside ankle-brachial indices are 0.15 on the right and 0.48 on the left  Outside Studies/Documentation   Assessment:  Right leg ischemic ulcer Plan: I discussed that this is a limb threatening  situation. I feel the first step is to proceed with angiography. If at that time she has a lesion amenable to stenting out proceed at that time. L. plan on accessing the left groin doing an aortogram and studying both legs. This is been scheduled for Tuesday, March 12. The risks of bleeding embolization were discussed with the patient. She will continue on her aspirin and Plavix     V. Charlena Cross, M.D. [facility] [place] Office: [phone] Pager:  [phone]

## 2011-11-06 NOTE — Interval H&P Note (Signed)
History and Physical Interval Note:  11/06/2011 9:38 AM  [name]  has presented today for surgery, with the diagnosis of PVD  The various methods of treatment have been discussed with the patient and family. After consideration of risks, benefits and other options for treatment, the patient has consented to  Procedure(s) (LRB): ABDOMINAL AORTAGRAM (N/A) as a surgical intervention .  The patients' history has been reviewed, patient examined, no change in status, stable for surgery.  I have reviewed the patients' chart and labs.  Questions were answered to the patient's satisfaction.     Kegan Mckeithan IV, V. WELLS

## 2011-11-06 NOTE — Op Note (Signed)
[facility]   Patient name: [name] MRN: [id number] DOB: 12-Jul-1971 Sex: female  11/06/2011 Pre-operative Diagnosis: Right leg ulcer  Post-operative diagnosis:  Same Surgeon:  Jorge Ny Procedure Performed:  1.  U/S L CFA Access  2.  Aortogram  3.  Bilateral Runoff  Indications:  RIght leg ulcer with ABI 0.15  Procedure:  The patient was identified in the holding area and taken to room 8.  The patient was then placed supine on the table and prepped and draped in the usual sterile fashion.  A time out was called.  Ultrasound was used to evaluate the left common femoral artery.  It was patent .  A digital ultrasound image was acquired.  A micropuncture needle was used to access the left common femoral artery under ultrasound guidance.  An 018 wire was advanced without resistance and a micropuncture sheath was placed.  The 018 wire was removed and a benson wire was placed.  The micropuncture sheath was exchanged for a 5 french sheath.  An omniflush catheter was advanced over the wire to the level of L-1.  An abdominal angiogram was obtained. The catheter was pulled down to the aortic bifurcation oblique pelvic angiogram was performed followed by bilateral runoff.  Findings:   Aortogram:  The visualized portion of the supra-renal abdominal aorta shows no significant disease. There are single renal arteries bilaterally which are widely patent. The infrarenal abdominal aorta is widely patent. 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 There is reconstitution of the right femoral artery. There is a high-grade stenosis at the origin of the left common iliac artery. The external iliac artery is patent throughout it's course.  Right Lower Extremity:  Right common femoral artery is patent. The right profunda femoris patent. The superficial is occluded with reconstitution of the above-knee popliteal artery however  there is disease within the popliteal artery behind the knee. Three-vessel runoff is visualized to the midcalf, however proximal disease obscures full evaluation . However, the posterior tibial artery is the dominant vessel.  Left Lower Extremity:  The left common femoral artery is patent. The profunda femoral artery is patent. The superficial femoral artery is occluded. There is reconstitution of the above-knee popliteal artery and three-vessel runoff  Intervention:  None  Impression:  #1  occluded right external iliac artery  #2  bilateral superficial femoral artery at the   V. [name], M.D. [facility] [place] Office: [phone] Pager:  [phone]

## 2011-11-06 NOTE — Discharge Instructions (Signed)

## 2011-11-07 ENCOUNTER — Encounter (HOSPITAL_COMMUNITY): Payer: Self-pay | Admitting: *Deleted

## 2011-11-07 ENCOUNTER — Other Ambulatory Visit: Payer: Self-pay | Admitting: Ophthalmology

## 2011-11-08 IMAGING — XA IR DECLOT *L*
1 series · 12 of 24 positions shown · non-contrast
Comparison: none

***ADDENDUM*** CREATED: 11/10/2010 [DATE]

Correction:  The graft was treated with the PTD thrombectomy device
rather than the AngioJet device.
***END ADDENDUM*** SIGNED BY: Martinspedro Ollinger, M.D.
CLINICAL HISTORY: End-stage renal disease and clotted left upper
extremity graft

[Series 1: run · 12 of 87 slices shown]
[im 4/87]
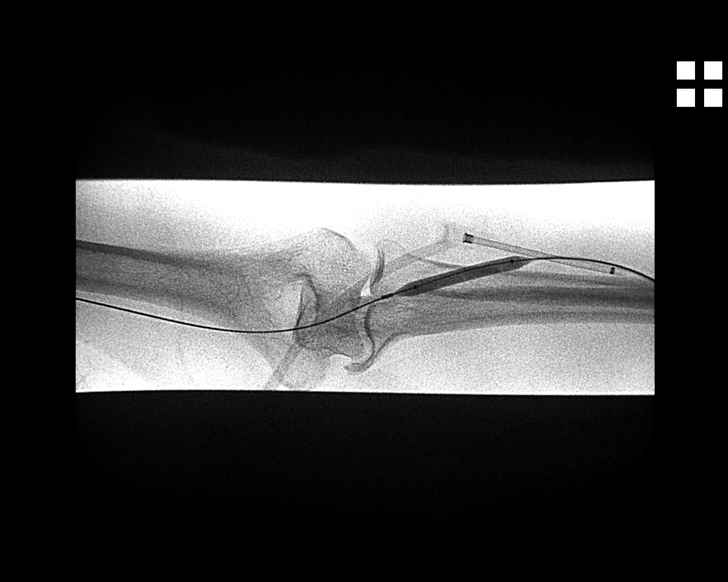
[im 12/87]
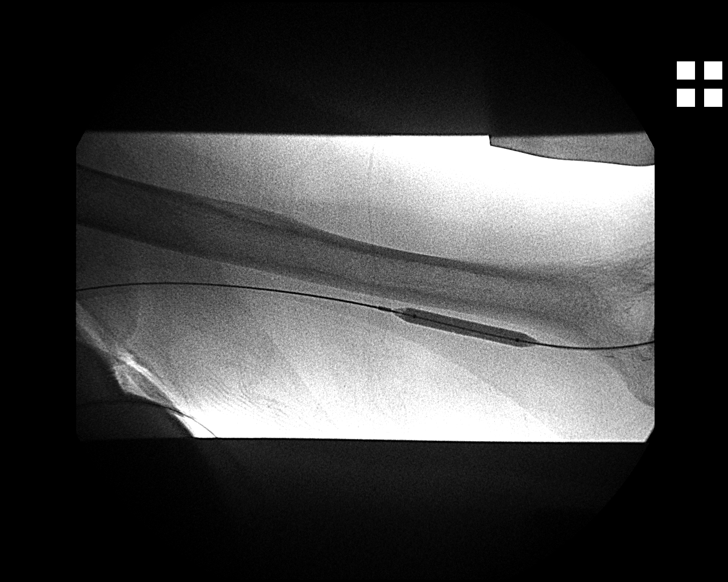
[im 19/87]
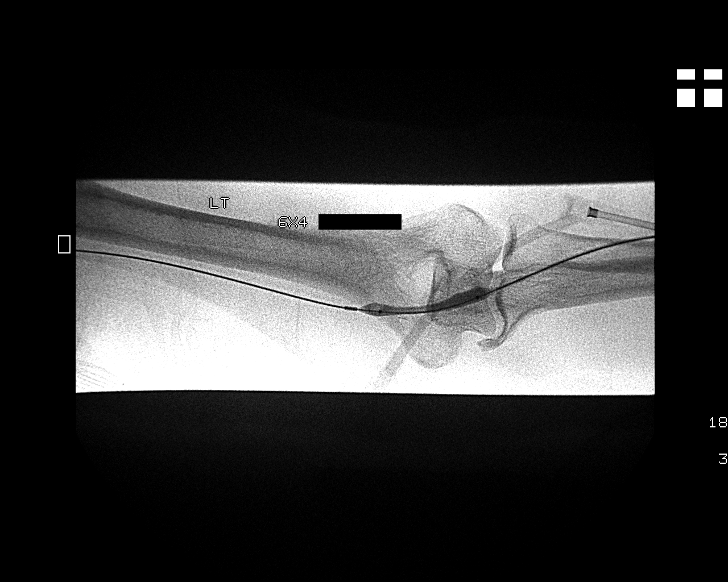
[im 27/87]
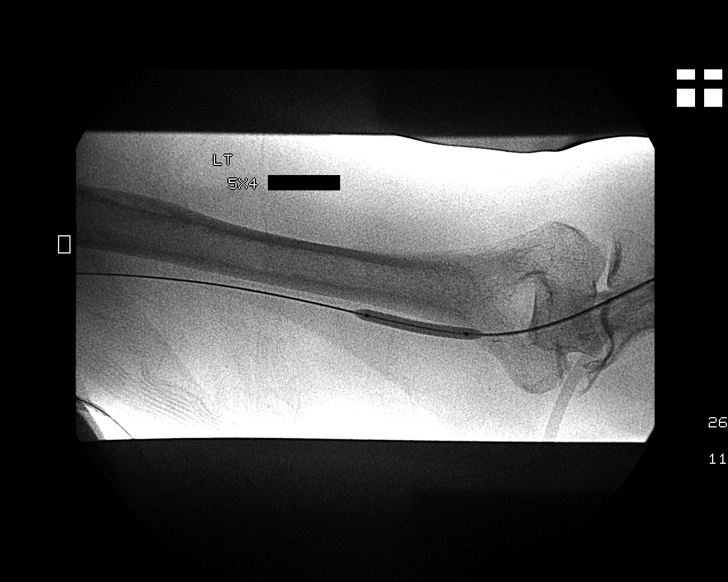
[im 34/87]
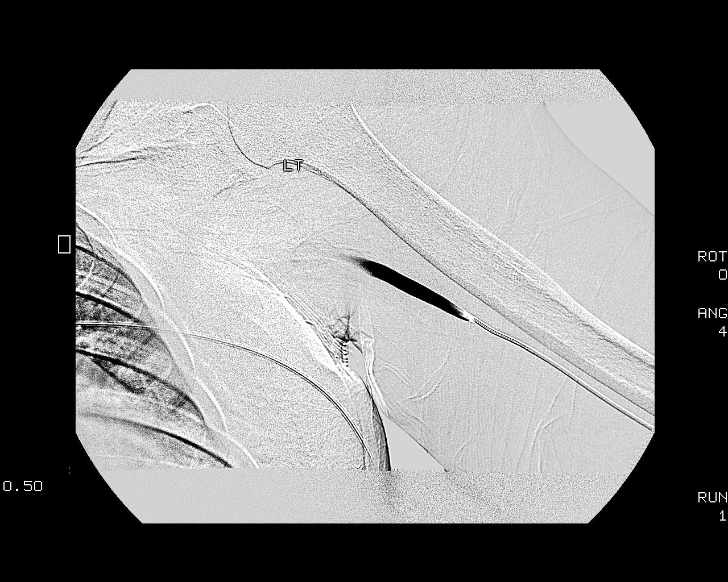
[im 42/87]
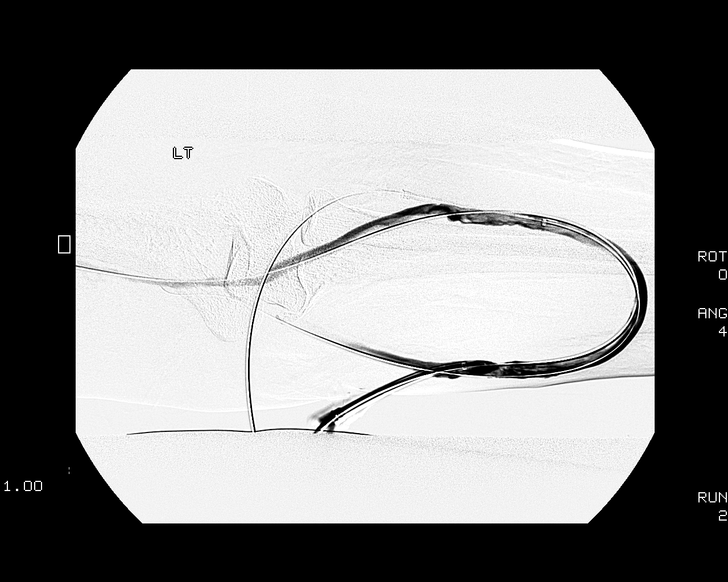
[im 49/87]
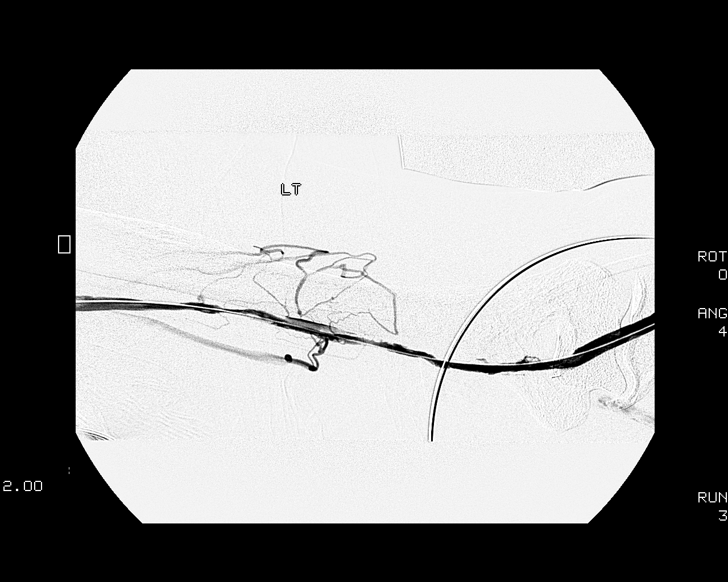
[im 57/87]
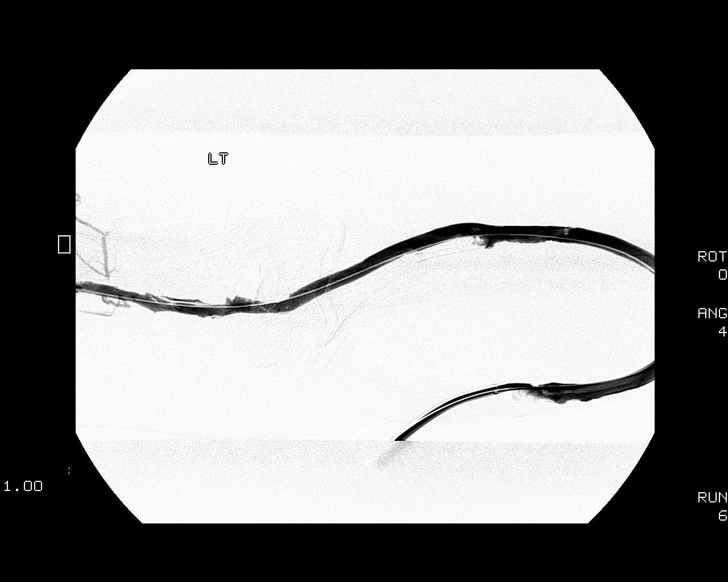
[im 64/87]
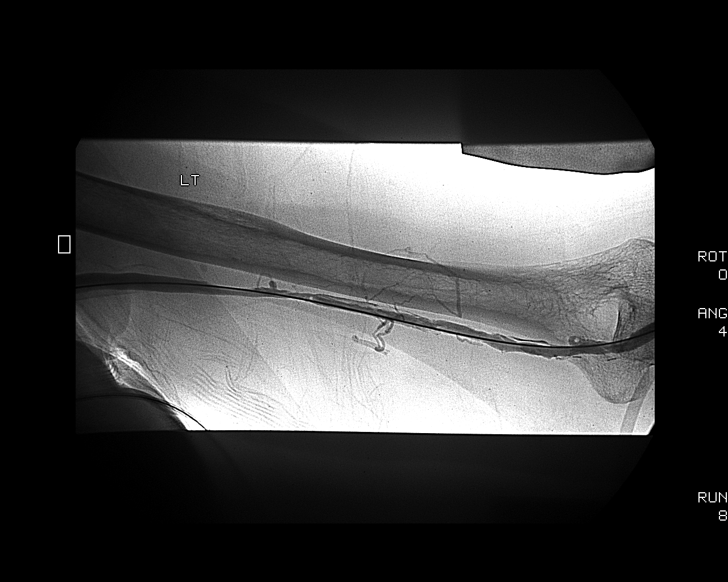
[im 72/87]
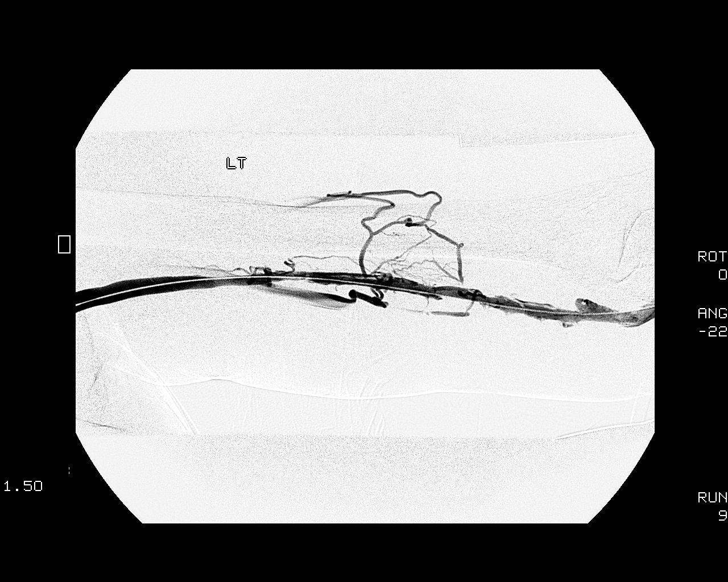
[im 79/87]
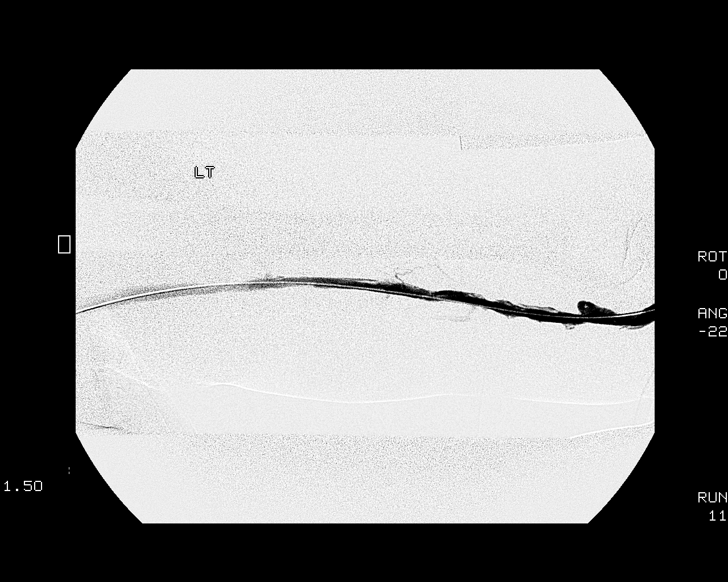
[im 87/87]
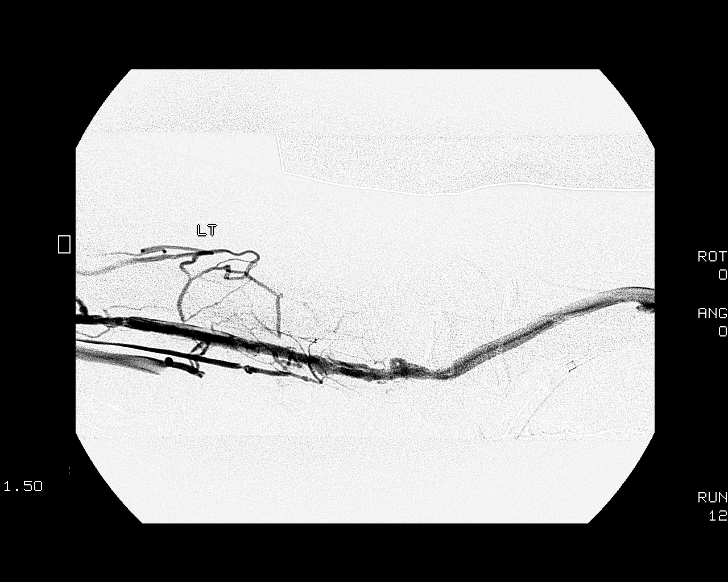

[12 of 24 positions shown; findings below may reference images not displayed]

PROCEDURE(S): DIALYSIS GRAFT DECLOT; GRAFT/VENOUS PTA; ULTRASOUND
GUIDANCE FOR VASCULAR ACCESS

Medications:Heparin 1888 units, TPA 2 mg, Versed 4.0 mg,
Fentanyl6JJmcg

Moderate sedation time:75 minutes

Fluoroscopy time: 9.9 minutes

Contrast: 80 ml Omnipaque 300

Procedure:Informed consent was obtained for a declot procedure.
The patient understood the risks of pulmonary embolism.  The left
forearm was prepped and draped in a sterile fashion.  Maximal
barrier sterile technique was utilized including caps, mask,
sterile gowns, sterile gloves, sterile drape, hand hygiene and skin
antiseptic.  The skin was anesthetized with 1% lidocaine.  The
graft was accessed using 21 gauge needles towards the venous and
arterial anastomoses with ultrasound guidance.  Ultrasound images
were obtained for documentation. Micropuncture catheters were
placed.  2 mg of TPA was infused through the micropuncture
catheters.  The vascular access pointing towards the central veins
was upsized to a 7-French vascular sheath.  A 5-French catheter was
advanced into left upper arm and a venogram was performed.
Fluoroscopic images were saved for documentation.  A Bentson wire
was placed centrally and the graft and outflow vein were treated
with the AngioJet thrombectomy device.  The venous anastomosis and
outflow vein was angioplastied with a 6 mm x 40 mm balloon.  The
access pointing towards the arterial anastomosis was upsized to a 6-
French sheath.  A wire was advanced into the arterial system.  The
arterial plug was pulled using a 5.5 Fogarty balloon.  There was
flow in the graft.  Shunt images demonstrated diffuse narrowing and
irregularity of the left upper arm outflow vein. The outflow vein
above the elbow was angioplastied using the 6 mm x 40 mm balloon in
an overlapping fashion.  The patient had significant pain during
inflation of the balloon.  Follow-up shuntogram images demonstrated
residual irregularity and non flow-limiting dissection.  The
outflow vein was treated again with a 5 mm x 40 mm Mustang balloon.
Follow-up shuntogram images were obtained.  The outflow vein was
treated a final time with the 6 mm x 40 mm balloon and follow-up
shuntogram images were obtained.  Reflux shuntogram was performed
by inflating the Fogarty balloon in the graft. The Fogarty balloon
was pulled across arterial anastomosis a last time.  Follow-up
shuntogram images were obtained.  The vascular sheaths were removed
with purse string sutures.  No immediate complication.
FINDINGS: Long segment stenosis and irregularity of the outflow
vein above the elbow.  The patient experienced significant pain
during dilatation of this vein.  There is a long segment dissection
involving the treated vein.  However, the dissection does not
appear be flow limiting.  There was antegrade flow throughout the
outflow vein at the end of the procedure with minor filling of
collateral vessels.  Graft and arterial anastomosis are patent.
IMPRESSION: Successful declot of the left upperextremity graft.

There is marked irregularity and dissection involving the left
upper arm outflow vein.  The extensive diseased makes it difficult
to treat the outflow vein with balloon angioplasty and even stent
placement.  Recommend surgical consultation for revision if the
graft and outflow vein occlude within the next 3 months.

## 2011-11-09 ENCOUNTER — Encounter (HOSPITAL_COMMUNITY): Payer: Self-pay | Admitting: Ophthalmology

## 2011-11-09 ENCOUNTER — Ambulatory Visit (HOSPITAL_COMMUNITY): Payer: Medicare Other | Admitting: Anesthesiology

## 2011-11-09 ENCOUNTER — Encounter (HOSPITAL_COMMUNITY): Payer: Self-pay | Admitting: Anesthesiology

## 2011-11-09 ENCOUNTER — Encounter (HOSPITAL_COMMUNITY)
Admission: RE | Disposition: A | Discharge status: Home / Self Care | Payer: Self-pay | Source: Ambulatory Visit | Attending: Ophthalmology

## 2011-11-09 ENCOUNTER — Ambulatory Visit (HOSPITAL_COMMUNITY): Payer: Medicare Other

## 2011-11-09 ENCOUNTER — Ambulatory Visit (HOSPITAL_COMMUNITY)
Admission: RE | Admit: 2011-11-09 | Discharge: 2011-11-09 | Disposition: A | Discharge status: Home / Self Care | Payer: Medicare Other | Source: Ambulatory Visit | Attending: Ophthalmology | Admitting: Ophthalmology

## 2011-11-09 DIAGNOSIS — J449 Chronic obstructive pulmonary disease, unspecified: Secondary | ICD-10-CM | POA: Insufficient documentation

## 2011-11-09 DIAGNOSIS — I2510 Atherosclerotic heart disease of native coronary artery without angina pectoris: Secondary | ICD-10-CM | POA: Insufficient documentation

## 2011-11-09 DIAGNOSIS — H211X9 Other vascular disorders of iris and ciliary body, unspecified eye: Secondary | ICD-10-CM | POA: Insufficient documentation

## 2011-11-09 DIAGNOSIS — F329 Major depressive disorder, single episode, unspecified: Secondary | ICD-10-CM | POA: Insufficient documentation

## 2011-11-09 DIAGNOSIS — F17200 Nicotine dependence, unspecified, uncomplicated: Secondary | ICD-10-CM | POA: Insufficient documentation

## 2011-11-09 DIAGNOSIS — F411 Generalized anxiety disorder: Secondary | ICD-10-CM | POA: Insufficient documentation

## 2011-11-09 DIAGNOSIS — Z992 Dependence on renal dialysis: Secondary | ICD-10-CM | POA: Insufficient documentation

## 2011-11-09 DIAGNOSIS — H4051X0 Glaucoma secondary to other eye disorders, right eye, stage unspecified: Secondary | ICD-10-CM

## 2011-11-09 DIAGNOSIS — F3289 Other specified depressive episodes: Secondary | ICD-10-CM | POA: Insufficient documentation

## 2011-11-09 DIAGNOSIS — R51 Headache: Secondary | ICD-10-CM | POA: Insufficient documentation

## 2011-11-09 DIAGNOSIS — J4489 Other specified chronic obstructive pulmonary disease: Secondary | ICD-10-CM | POA: Insufficient documentation

## 2011-11-09 DIAGNOSIS — H4089 Other specified glaucoma: Secondary | ICD-10-CM | POA: Insufficient documentation

## 2011-11-09 DIAGNOSIS — H409 Unspecified glaucoma: Secondary | ICD-10-CM | POA: Insufficient documentation

## 2011-11-09 DIAGNOSIS — I252 Old myocardial infarction: Secondary | ICD-10-CM | POA: Insufficient documentation

## 2011-11-09 DIAGNOSIS — E1136 Type 2 diabetes mellitus with diabetic cataract: Secondary | ICD-10-CM | POA: Insufficient documentation

## 2011-11-09 DIAGNOSIS — N189 Chronic kidney disease, unspecified: Secondary | ICD-10-CM | POA: Insufficient documentation

## 2011-11-09 DIAGNOSIS — K08109 Complete loss of teeth, unspecified cause, unspecified class: Secondary | ICD-10-CM | POA: Insufficient documentation

## 2011-11-09 DIAGNOSIS — E11359 Type 2 diabetes mellitus with proliferative diabetic retinopathy without macular edema: Secondary | ICD-10-CM | POA: Insufficient documentation

## 2011-11-09 DIAGNOSIS — I129 Hypertensive chronic kidney disease with stage 1 through stage 4 chronic kidney disease, or unspecified chronic kidney disease: Secondary | ICD-10-CM | POA: Insufficient documentation

## 2011-11-09 DIAGNOSIS — E1139 Type 2 diabetes mellitus with other diabetic ophthalmic complication: Secondary | ICD-10-CM | POA: Insufficient documentation

## 2011-11-09 HISTORY — DX: Anxiety disorder, unspecified: F41.9

## 2011-11-09 HISTORY — PX: CATARACT EXTRACTION W/PHACO: SHX586

## 2011-11-09 HISTORY — DX: Major depressive disorder, single episode, unspecified: F32.9

## 2011-11-09 HISTORY — DX: Methicillin resistant Staphylococcus aureus infection, unspecified site: A49.02

## 2011-11-09 HISTORY — DX: Depression, unspecified: F32.A

## 2011-11-09 HISTORY — PX: PARS PLANA VITRECTOMY: SHX2166

## 2011-11-09 HISTORY — DX: Peripheral vascular disease, unspecified: I73.9

## 2011-11-09 HISTORY — DX: Acute myocardial infarction, unspecified: I21.9

## 2011-11-09 LAB — POCT I-STAT 4, (NA,K, GLUC, HGB,HCT)
Glucose, Bld: 135 mg/dL — ABNORMAL HIGH (ref 70–99)
Potassium: 3.1 mEq/L — ABNORMAL LOW (ref 3.5–5.1)
Sodium: 132 mEq/L — ABNORMAL LOW (ref 135–145)

## 2011-11-09 LAB — GLUCOSE, CAPILLARY
Glucose-Capillary: 147 mg/dL — ABNORMAL HIGH (ref 70–99)
Glucose-Capillary: 72 mg/dL (ref 70–99)

## 2011-11-09 SURGERY — PARS PLANA VITRECTOMY WITH 25 GAUGE
Anesthesia: General | Laterality: Right

## 2011-11-09 SURGERY — PHACOEMULSIFICATION, CATARACT, WITH IOL INSERTION
Anesthesia: General | Site: Eye | Laterality: Right | Wound class: Clean

## 2011-11-09 MED ORDER — EPHEDRINE SULFATE 50 MG/ML IJ SOLN
INTRAMUSCULAR | Status: DC | PRN
  Administered 2011-11-09 (×2): 10 mg via INTRAVENOUS

## 2011-11-09 MED ORDER — GLYCOPYRROLATE 0.2 MG/ML IJ SOLN
INTRAMUSCULAR | Status: DC | PRN
  Administered 2011-11-09: 0.4 mg via INTRAVENOUS

## 2011-11-09 MED ORDER — PROVISC 10 MG/ML IO SOLN
INTRAOCULAR | Status: DC | PRN
  Administered 2011-11-09: .85 mL via INTRAOCULAR

## 2011-11-09 MED ORDER — BSS IO SOLN
INTRAOCULAR | Status: DC | PRN
  Administered 2011-11-09: 15 mL via INTRAOCULAR

## 2011-11-09 MED ORDER — HYPROMELLOSE (GONIOSCOPIC) 2.5 % OP SOLN
OPHTHALMIC | Status: DC | PRN
  Administered 2011-11-09: 1 [drp] via OPHTHALMIC

## 2011-11-09 MED ORDER — NEOSTIGMINE METHYLSULFATE 1 MG/ML IJ SOLN
INTRAMUSCULAR | Status: DC | PRN
  Administered 2011-11-09: 3 mg via INTRAVENOUS

## 2011-11-09 MED ORDER — VECURONIUM BROMIDE 10 MG IV SOLR
INTRAVENOUS | Status: DC | PRN
  Administered 2011-11-09: 2 mg via INTRAVENOUS
  Administered 2011-11-09: 8 mg via INTRAVENOUS
  Administered 2011-11-09: 2 mg via INTRAVENOUS

## 2011-11-09 MED ORDER — TROPICAMIDE 1 % OP SOLN
1.0000 [drp] | OPHTHALMIC | Status: AC | PRN
  Administered 2011-11-09 (×3): 1 [drp] via OPHTHALMIC
  Filled 2011-11-09: qty 3

## 2011-11-09 MED ORDER — NA CHONDROIT SULF-NA HYALURON 40-30 MG/ML IO SOLN
INTRAOCULAR | Status: DC | PRN
  Administered 2011-11-09: 0.5 mL via INTRAOCULAR

## 2011-11-09 MED ORDER — BACITRACIN-POLYMYXIN B 500-10000 UNIT/GM OP OINT
TOPICAL_OINTMENT | OPHTHALMIC | Status: DC | PRN
  Administered 2011-11-09: 1 via OPHTHALMIC

## 2011-11-09 MED ORDER — CEFAZOLIN SODIUM 1-5 GM-% IV SOLN
INTRAVENOUS | Status: DC | PRN
  Administered 2011-11-09: 1 g via INTRAVENOUS

## 2011-11-09 MED ORDER — BSS PLUS IO SOLN
INTRAOCULAR | Status: DC | PRN
  Administered 2011-11-09: 1 via OPHTHALMIC

## 2011-11-09 MED ORDER — ONDANSETRON HCL 4 MG/2ML IJ SOLN: 4.0000 mg | Freq: Once | INTRAMUSCULAR | Status: DC | PRN

## 2011-11-09 MED ORDER — MIDAZOLAM HCL 5 MG/5ML IJ SOLN
INTRAMUSCULAR | Status: DC | PRN
  Administered 2011-11-09: .5 mg via INTRAVENOUS

## 2011-11-09 MED ORDER — HYDROMORPHONE HCL PF 1 MG/ML IJ SOLN
0.2500 mg | INTRAMUSCULAR | Status: DC | PRN
  Administered 2011-11-09: 0.5 mg via INTRAVENOUS

## 2011-11-09 MED ORDER — BSS IO SOLN
INTRAOCULAR | Status: DC | PRN
  Administered 2011-11-09: 500 mL via INTRAOCULAR

## 2011-11-09 MED ORDER — SODIUM CHLORIDE 0.9 % IV SOLN
INTRAVENOUS | Status: DC | PRN
  Administered 2011-11-09 (×2): via INTRAVENOUS

## 2011-11-09 MED ORDER — EPINEPHRINE HCL 1 MG/ML IJ SOLN
INTRAMUSCULAR | Status: DC | PRN
  Administered 2011-11-09: .3 mg

## 2011-11-09 MED ORDER — GATIFLOXACIN 0.5 % OP SOLN
1.0000 [drp] | OPHTHALMIC | Status: AC | PRN
  Administered 2011-11-09 (×3): 1 [drp] via OPHTHALMIC
  Filled 2011-11-09: qty 2.5

## 2011-11-09 MED ORDER — PHENYLEPHRINE HCL 2.5 % OP SOLN
1.0000 [drp] | OPHTHALMIC | Status: AC | PRN
  Administered 2011-11-09 (×3): 1 [drp] via OPHTHALMIC
  Filled 2011-11-09: qty 3

## 2011-11-09 MED ORDER — SODIUM CHLORIDE 0.9 % IR SOLN
Status: DC | PRN
  Administered 2011-11-09: 1000 mL

## 2011-11-09 MED ORDER — FENTANYL CITRATE 0.05 MG/ML IJ SOLN
INTRAMUSCULAR | Status: DC | PRN
  Administered 2011-11-09: 200 ug via INTRAVENOUS

## 2011-11-09 MED ORDER — PHENYLEPHRINE HCL 10 MG/ML IJ SOLN
INTRAMUSCULAR | Status: DC | PRN
  Administered 2011-11-09 (×3): 80 ug via INTRAVENOUS

## 2011-11-09 MED ORDER — CEFAZOLIN SODIUM 1-5 GM-% IV SOLN
INTRAVENOUS | Status: AC
  Filled 2011-11-09: qty 50

## 2011-11-09 MED ORDER — ATROPINE SULFATE 1 % OP SOLN
1.0000 [drp] | OPHTHALMIC | Status: AC | PRN
  Administered 2011-11-09 (×3): 1 [drp] via OPHTHALMIC
  Filled 2011-11-09: qty 2

## 2011-11-09 MED ORDER — ONDANSETRON HCL 4 MG/2ML IJ SOLN
INTRAMUSCULAR | Status: DC | PRN
  Administered 2011-11-09: 4 mg via INTRAVENOUS

## 2011-11-09 MED ORDER — DEXAMETHASONE SODIUM PHOSPHATE 10 MG/ML IJ SOLN
INTRAMUSCULAR | Status: DC | PRN
  Administered 2011-11-09: 10 mg

## 2011-11-09 MED ORDER — PROPOFOL 10 MG/ML IV BOLUS
INTRAVENOUS | Status: DC | PRN
  Administered 2011-11-09: 200 mg via INTRAVENOUS

## 2011-11-09 SURGICAL SUPPLY — 74 items
ACCESSORY FRAGMATOME (MISCELLANEOUS) IMPLANT
ALLOGRAFT TUTOPLAST 1.5X1.5 (Ophthalmic Related) ×2 IMPLANT
APPLICATOR COTTON TIP 6IN STRL (MISCELLANEOUS) ×3 IMPLANT
APPLICATOR DR MATTHEWS STRL (MISCELLANEOUS) ×3 IMPLANT
BLADE MVR KNIFE 20G (BLADE) IMPLANT
CANN HYDRODISSECT NUC 25X7/8 (MISCELLANEOUS) ×3
CANNULA ANT CHAM MAIN (OPHTHALMIC RELATED) ×3 IMPLANT
CANNULA ANTERIOR CHAMBER 27GA (MISCELLANEOUS) ×3 IMPLANT
CANNULA FLEX TIP 25G (CANNULA) ×3 IMPLANT
CANNULA HYDRODISSEC NUC 25X7/8 (MISCELLANEOUS) ×2 IMPLANT
CLOTH BEACON ORANGE TIMEOUT ST (SAFETY) ×3 IMPLANT
CORDS BIPOLAR (ELECTRODE) ×3 IMPLANT
DRAPE OPHTHALMIC 77X100 STRL (CUSTOM PROCEDURE TRAY) ×3 IMPLANT
FILTER BLUE MILLIPORE (MISCELLANEOUS) IMPLANT
FORCEPS ECKARDT ILM 25G SERR (OPHTHALMIC RELATED) IMPLANT
FORCEPS HORIZONTAL 25G DISP (OPHTHALMIC RELATED) IMPLANT
GAS OPHTHALMIC (MISCELLANEOUS) IMPLANT
GLOVE BIO SURGEON STRL SZ 6.5 (GLOVE) ×3 IMPLANT
GLOVE SS BIOGEL STRL SZ 8 (GLOVE) ×2 IMPLANT
GLOVE SUPERSENSE BIOGEL SZ 8 (GLOVE) ×1
GLOVE SURG SS PI 6.5 STRL IVOR (GLOVE) ×6 IMPLANT
GLOVE SURG SS PI 7.0 STRL IVOR (GLOVE) ×3 IMPLANT
GOWN PREVENTION PLUS XLARGE (GOWN DISPOSABLE) ×3 IMPLANT
GOWN STRL NON-REIN LRG LVL3 (GOWN DISPOSABLE) ×6 IMPLANT
ILLUMINATOR ENDO 25GA (MISCELLANEOUS) ×3 IMPLANT
KIT BASIN OR (CUSTOM PROCEDURE TRAY) ×3 IMPLANT
KIT PERFLUORON PROCEDURE 5ML (MISCELLANEOUS) IMPLANT
KIT ROOM TURNOVER OR (KITS) ×3 IMPLANT
KNIFE CRESCENT 2.5 55 ANG (BLADE) ×3 IMPLANT
LENS BIOM SUPER VIEW SET DISP (OPHTHALMIC RELATED) ×3 IMPLANT
LENS IOL 3 PIECE DIOP 20.0 (Intraocular Lens) ×3 IMPLANT
MARKER SKIN DUAL TIP RULER LAB (MISCELLANEOUS) ×3 IMPLANT
MASK EYE SHIELD (GAUZE/BANDAGES/DRESSINGS) ×3 IMPLANT
MICROPICK 25G (MISCELLANEOUS)
NEEDLE 18GX1X1/2 (RX/OR ONLY) (NEEDLE) IMPLANT
NEEDLE 25GX 5/8IN NON SAFETY (NEEDLE) IMPLANT
NEEDLE FILTER BLUNT 18X 1/2SAF (NEEDLE)
NEEDLE FILTER BLUNT 18X1 1/2 (NEEDLE) IMPLANT
NEEDLE HYPO 25GX1X1/2 BEV (NEEDLE) ×3 IMPLANT
NS IRRIG 1000ML POUR BTL (IV SOLUTION) ×3 IMPLANT
PACK CATARACT CUSTOM (CUSTOM PROCEDURE TRAY) ×3 IMPLANT
PACK CATARACT MCHSCP (PACKS) ×3 IMPLANT
PACK VITRECTOMY CUSTOM (CUSTOM PROCEDURE TRAY) IMPLANT
PAD ARMBOARD 7.5X6 YLW CONV (MISCELLANEOUS) ×6 IMPLANT
PAD EYE OVAL STERILE LF (GAUZE/BANDAGES/DRESSINGS) ×3 IMPLANT
PAK VITRECTOMY PIK 25 GA (OPHTHALMIC RELATED) ×3 IMPLANT
PENCIL BIPOLAR 25GA STR DISP (OPHTHALMIC RELATED) ×3 IMPLANT
PICK MICROPICK 25G (MISCELLANEOUS) IMPLANT
PROBE ANTERIOR 20G W/INFUS NDL (MISCELLANEOUS) IMPLANT
PROBE DIRECTIONAL LASER (MISCELLANEOUS) ×3 IMPLANT
ROLLS DENTAL (MISCELLANEOUS) ×6 IMPLANT
SCRAPER DIAMOND 25GA (OPHTHALMIC RELATED) IMPLANT
SET FLUID INJECTOR (SET/KITS/TRAYS/PACK) IMPLANT
STOCKINETTE IMPERVIOUS 9X36 MD (GAUZE/BANDAGES/DRESSINGS) ×6 IMPLANT
STOPCOCK 4 WAY LG BORE MALE ST (IV SETS) IMPLANT
SUT ETHILON 10 0 CS140 6 (SUTURE) IMPLANT
SUT ETHILON 8 0 BV130 4 (SUTURE) ×3 IMPLANT
SUT MERSILENE 5 0 RD 1 DA (SUTURE) IMPLANT
SUT PROLENE 10 0 CIF 4 DA (SUTURE) IMPLANT
SUT SILK 2 0 (SUTURE)
SUT SILK 2-0 18XBRD TIE 12 (SUTURE) IMPLANT
SUT VICRYL  9 0 (SUTURE) ×1
SUT VICRYL 7 0 TG140 8 (SUTURE) ×3 IMPLANT
SUT VICRYL 9 0 (SUTURE) ×2 IMPLANT
SYR 30ML SLIP (SYRINGE) IMPLANT
SYR 5ML LL (SYRINGE) IMPLANT
SYR TB 1ML LUER SLIP (SYRINGE) ×3 IMPLANT
TAPE SURG TRANSPORE 1 IN (GAUZE/BANDAGES/DRESSINGS) ×2 IMPLANT
TAPE SURGICAL TRANSPORE 1 IN (GAUZE/BANDAGES/DRESSINGS) ×1
TOWEL OR 17X24 6PK STRL BLUE (TOWEL DISPOSABLE) ×6 IMPLANT
TUTOPLAST 1.5X1.5 (Ophthalmic Related) ×3 IMPLANT
VALVE GLAUCOMA AHMED (Prosthesis & Implant Heart) ×3 IMPLANT
WATER STERILE IRR 1000ML POUR (IV SOLUTION) ×3 IMPLANT
WIPE INSTRUMENT VISIWIPE 73X73 (MISCELLANEOUS) ×3 IMPLANT

## 2011-11-09 NOTE — Anesthesia Preprocedure Evaluation (Addendum)
Anesthesia Evaluation  Patient identified by MRN, date of birth, ID band Patient awake    Reviewed: Allergy & Precautions, H&P , NPO status , Patient's Chart, lab work & pertinent test results  Airway Mallampati: II      Dental  (+) Edentulous Upper and Edentulous Lower   Pulmonary shortness of breath and with exertion, COPDCurrent Smoker,  breath sounds clear to auscultation        Cardiovascular hypertension, Pt. on medications + CAD and + Past MI Rhythm:Regular Rate:Normal     Neuro/Psych  Headaches, PSYCHIATRIC DISORDERS Anxiety Depression    GI/Hepatic   Endo/Other  Diabetes mellitus-, Type 2  Renal/GU Dialysis and CRFRenal disease     Musculoskeletal   Abdominal   Peds  Hematology   Anesthesia Other Findings   Reproductive/Obstetrics                          Anesthesia Physical Anesthesia Plan  ASA: III  Anesthesia Plan: General   Post-op Pain Management:    Induction: Intravenous  Airway Management Planned: Oral ETT  Additional Equipment:   Intra-op Plan:   Post-operative Plan: Extubation in OR  Informed Consent: I have reviewed the patients History and Physical, chart, labs and discussed the procedure including the risks, benefits and alternatives for the proposed anesthesia with the patient or authorized representative who has indicated his/her understanding and acceptance.   Dental advisory given  Plan Discussed with: CRNA and Anesthesiologist  Anesthesia Plan Comments:        Anesthesia Quick Evaluation

## 2011-11-09 NOTE — Progress Notes (Signed)
Alfonso Ramus RN called Dr Luciana Axe for surgical orders at~ [phone]

## 2011-11-09 NOTE — Anesthesia Postprocedure Evaluation (Signed)
  Anesthesia Post-op Note  Patient: [name]  Procedure(s) Performed: Procedure(s) (LRB): CATARACT EXTRACTION PHACO AND INTRAOCULAR LENS PLACEMENT (IOC) (Right) PARS PLANA VITRECTOMY WITH 23 GAUGE (Right)  Patient Location: PACU  Anesthesia Type: General  Level of Consciousness: awake, alert  and oriented  Airway and Oxygen Therapy: Patient Spontanous Breathing and Patient connected to nasal cannula oxygen  Post-op Pain: mild  Post-op Assessment: Post-op Vital signs reviewed  Post-op Vital Signs: stable  Complications: No apparent anesthesia complications

## 2011-11-09 NOTE — Transfer of Care (Signed)
Immediate Anesthesia Transfer of Care Note  Patient: [name]  Procedure(s) Performed: Procedure(s) (LRB): CATARACT EXTRACTION PHACO AND INTRAOCULAR LENS PLACEMENT (IOC) (Right) PARS PLANA VITRECTOMY WITH 23 GAUGE (Right)  Patient Location: PACU  Anesthesia Type: General  Level of Consciousness: awake, alert  and oriented  Airway & Oxygen Therapy: Patient Spontanous Breathing and Patient connected to nasal cannula oxygen  Post-op Assessment: Report given to PACU RN, Post -op Vital signs reviewed and unstable, Anesthesiologist notified and Patient moving all extremities X 4  Post vital signs: Reviewed and stable  Complications: No apparent anesthesia complications

## 2011-11-09 NOTE — H&P (Signed)
[name] is an 41 y.o. female.   Chief Complaint: VISION LOSS, CATARACT AND NEOVASCULAR GLAUCOMA RIGHT EYE  HPI: NEOVASCULAR GLAUCOMA, DIABETIC RETINOPATHY, CATARACT,  WITH CORNEAL OPACITY.  Past Medical History  Diagnosis Date  . DVT (deep venous thrombosis)     DVT HISTORY  . Diabetes mellitus   . Migraine     HISTORY  . Anxiety and depression   . CAD (coronary artery disease)   . Bipolar 1 disorder   . Hyperlipidemia   . Hyperparathyroidism   . PE (pulmonary embolism)     HISTORY  . Dialysis patient   . Anemia   . Hypertension   . Myocardial infarction   . Renal failure     dialysis [place] t,th sat  . Nephrotic syndrome     IN THE PAST  . Peripheral vascular disease   . MRSA infection   . Anxiety   . Depression     Past Surgical History  Procedure Date  . Back surgery X 4  . Cholecystectomy   . Incise and drain abcess     OF THIGHS FROM INSULIN INJECTIONS  . Heart stents     3 - 4  STENTS PLACED  . Portacath placement 2003  . Eye surgery     Family History  Problem Relation Age of Onset  . Transient ischemic attack Mother   . Hepatitis Sister   . Diabetes type II Other    Social History:  reports that she has been smoking Cigarettes.  She has a 40 pack-year smoking history. She has never used smokeless tobacco. She reports that she does not drink alcohol or use illicit drugs.  Allergies:  Allergies  Allergen Reactions  . Compazine (Prochlorperazine) Swelling    Per echart records  . Contrast Media (Iodinated Diagnostic Agents)     Per echart records  . Meperidine Hcl Swelling  . Metformin And Related Other (See Comments)    'just not myself, was told not to take metformin'  . Morphine Swelling  . Nubain (Nalbuphine Hcl) Swelling  . Topiramate (Topamax) Swelling    Tongue swelling - per echart records  . Toradol (Ketorolac Tromethamine) Swelling      Results for orders placed during the hospital encounter of 11/09/11 (from the past 48  hour(s))  GLUCOSE, CAPILLARY     Status: Abnormal   Collection Time   11/09/11  7:53 AM      Component Value Range Comment   Glucose-Capillary 147 (*) 70 - 99 (mg/dL)   GLUCOSE, CAPILLARY     Status: Abnormal   Collection Time   11/09/11  9:29 AM      Component Value Range Comment   Glucose-Capillary 111 (*) 70 - 99 (mg/dL)   GLUCOSE, CAPILLARY     Status: Normal   Collection Time   11/09/11 10:37 AM      Component Value Range Comment   Glucose-Capillary 86  70 - 99 (mg/dL)    Dg Chest 2 View  09/26/8655  *RADIOLOGY REPORT*  Clinical Data: Productive cough.  CHEST - 2 VIEW  Comparison: Chest x-ray 08/20/2011.  Findings: Right to subclavian single lumen Port-A-Cath with tip terminating in the distal superior vena cava.  Lung volumes are low.  There is a small right-sided pleural effusion, which appears to have decreased compared to the prior examination 08/20/2011.  No definite focal airspace consolidation.  No contralateral pleural effusion.  Pulmonary vasculature is normal.  Borderline cardiomegaly (unchanged).  Mediastinal contours are unremarkable.  IMPRESSION: 1.  Small right-sided pleural effusion. 2.  Right subclavian single lumen Port-A-Cath with tip in the distal superior vena cava.  Original Report Authenticated By: Florencia Reasons, M.D.    Review of Systems  Constitutional: Positive for malaise/fatigue.  HENT: Negative.   Eyes: Positive for blurred vision.  Skin: Negative.   Psychiatric/Behavioral: The patient is nervous/anxious.   All other systems reviewed and are negative.    Blood pressure 105/63, pulse 85, temperature 98.1 F (36.7 C), temperature source Oral, resp. rate 18, SpO2 98.00%. Physical Exam  Constitutional: She is oriented to person, place, and time. She appears well-developed and well-nourished. She appears distressed.  HENT:  Head: Normocephalic and atraumatic.  Eyes: Conjunctivae are normal.    Neck: Trachea normal, normal range of motion and  phonation normal.  Cardiovascular: Normal rate and regular rhythm.   Respiratory: Effort normal and breath sounds normal.  GI: Soft. Bowel sounds are normal.  Neurological: She is alert and oriented to person, place, and time.  Skin: Skin is warm and dry.  Psychiatric: Judgment normal. Her mood appears anxious. Her speech is slurred. She is slowed. Cognition and memory are normal. She exhibits a depressed mood.        Somewhat flat affect, drowsy     Assessment/Plan CATARACT EXTRACGTION RIGHT EYE, WITH GLAUCOMA SHUNT, AHMED OR BAERVELDT WITH POSSIBLE VITRECTOMY, WITH INTRAOCULAR LENS PLACMENT RIGHT EYE  [name] A 11/09/2011, 10:44 AM

## 2011-11-09 NOTE — Preoperative (Signed)
Beta Blockers   Reason not to administer Beta Blockers:Not Applicable 

## 2011-11-09 NOTE — Anesthesia Procedure Notes (Signed)
Procedure Name: Intubation Date/Time: 11/09/2011 11:07 AM Performed by: Marena Chancy Pre-anesthesia Checklist: Patient identified, Emergency Drugs available, Suction available, Patient being monitored and Timeout performed Patient Re-evaluated:Patient Re-evaluated prior to inductionOxygen Delivery Method: Circle system utilized Preoxygenation: Pre-oxygenation with 100% oxygen Intubation Type: IV induction Ventilation: Oral airway inserted - appropriate to patient size and Mask ventilation without difficulty Laryngoscope Size: Miller and 2 Grade View: Grade II Tube type: Oral Tube size: 7.0 mm Number of attempts: 1 Placement Confirmation: ETT inserted through vocal cords under direct vision,  positive ETCO2 and breath sounds checked- equal and bilateral Secured at: 21 cm Tube secured with: Tape Dental Injury: Teeth and Oropharynx as per pre-operative assessment

## 2011-11-09 NOTE — Discharge Instructions (Signed)
DISCHARGE TO HOME TODAY

## 2011-11-09 NOTE — Brief Op Note (Signed)
11/09/2011  12:45 PM  PATIENT:  [name]  41 y.o. female  PRE-OPERATIVE DIAGNOSIS:  CATARACT RIGHT EYE, NEOVASCULAR GLAUCOMA RIGHT EYE, Proliferative Diabetic Retinopathy  RIGHT EYE  POST-OPERATIVE DIAGNOSIS:  same  PROCEDURE:  Procedure(s) (LRB): CATARACT EXTRACTION PHACO AND INTRAOCULAR LENS PLACEMENT (IOC) (Right) PARS PLANA VITRECTOMY WITH 23 GAUGE (Right) with panretinal endolaser photocoagulation, right, insertion of aqueous shunt glaucoma, Ahmed valve, and scleral reinforcement patch graft right eye  SURGEON:  Surgeon(s) and Role:    * Edmon Crape, MD - Primary  PHYSICIAN ASSISTANT:   ASSISTANTS: none   ANESTHESIA:   IV sedation  EBL:   0  BLOOD ADMINISTERED:none  DRAINS: none   LOCAL MEDICATIONS USED:  XYLOCAINE   SPECIMEN:  No Specimen  DISPOSITION OF SPECIMEN:  N/A  COUNTS:  YES  TOURNIQUET:  * No tourniquets in log *  DICTATION: .Other Dictation: Dictation Number [id number]  PLAN OF CARE: Discharge to home after PACU  PATIENT DISPOSITION:  PACU - hemodynamically stable.   Delay start of Pharmacological VTE agent (>24hrs) due to surgical blood loss or risk of bleeding: not applicable

## 2011-11-10 NOTE — Op Note (Signed)
[name], [name]              ACCOUNT NO.:  [id number]  MEDICAL RECORD NO.:  [id number]  LOCATION:  [facility]                         FACILITY:  [facility]  PHYSICIAN:  Alford Highland. Dajuana Palen, M.D.   DATE OF BIRTH:  02/01/1971  DATE OF PROCEDURE:  11/09/2011 DATE OF DISCHARGE:  11/09/2011                              OPERATIVE REPORT   PREOPERATIVE DIAGNOSES: 1. Dense cataract with progression, status post previous vitrectomy     for advanced proliferative diabetic retinopathy and vitreous     hemorrhage. 2. Iris rubeosis progression with neovascular glaucoma, despite     maximally tolerated medical therapy and previous laser, right eye. 3. Proliferative diabetic retinopathy.  POSTOPERATIVE DIAGNOSES: 1. Dense cataract with progression, status post previous vitrectomy     for advanced proliferative diabetic retinopathy and vitreous     hemorrhage. 2. Iris rubeosis progression with neovascular glaucoma despite     maximally tolerated medical therapy and previous laser,  right eye. 3. Proliferative diabetic retinopathy.  PROCEDURE: 1. Posterior vitrectomy with endolaser  panphotocoagulation,     adjunctively placed right eye - 23 gauge superiorly. 2. Insertion of aqueous shunt, glaucoma valve - Ahmed valve, right     eye. 3. Phacoemulsification, cataract extraction with insertion, posterior     chamber intraocular lens into the capsular bag - lens [employer] model     Z902, power +20.0. 4. Scleral reinforcement graft - patch graft with Tutoplast, right     eye.  SURGEON:  Alford Highland. Nicolena Schurman, MD.  ANESTHESIA:  Local, retrobulbar, monitored anesthesia control.  INDICATIONS FOR PROCEDURE:  The patient is a 41 year old woman with poorly controlled diabetic retinopathy.  She has previous history of iris revascularization leading to rapid progression of neurovascular disease with moderate cataract in the past, but which required previous vitrectomy with endolaser photocoagulation to salvage the  globe, but because of the patient's cooperation at the time, was unable to perform any attempted _______to address___ the moderate nuclear sclerotic cataracts at that time.  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  Incidental progression of a significant cataract progression in his right eye.  In addition, with iris rubeosis recurrent and poorly controlled diabetes with poorly controlled intraocular pressures.  The patient understands this is an attempt to salvage the globe and salvage all useful vision in this eye.  She understands the need for cataract extraction ______with insertion of iol____ but also to allow for repeat vitrectomy, additional, panphotocoagulation anteriorly if it at all is possible, but more importantly to place a permanent glaucoma shunt - aqueous valve - Ahmed to lower the intraocular pressure.  She understands the risks of anesthesia including the rare occurrence of death, _______including but not___ limited to hemorrhage, infection, scarring, need for further surgery, no change in vision, loss of vision, progressive disease despite intervention. After appropriate signed consent was obtained.  PROCEDURE DETAILS:  The patient was taken to the operating room.  In the operating room, appropriate monitors followed by mild sedation. Appropriate site selection was confirmed.  General anesthesia was instituted without difficulty.  The right periocular region was then sterilely prepped and draped in usual sterile fashion.  The  lid speculum applied.  Incision was made and performed the cataract surgery at first. Clear corneal incision was made in a biplanar fashion.  Anterior chamber was deepened with Viscoat.  Paracentesis incision was made.  Bent cystotome needle was then used to create a circular tear capsulotomy which was done without complication.  Hydrodissection and delineation was then  carried out in the bag.  Phacofragmentation was then used to create a central groove and using a ___chopper, the removal of_______ nucleus, ___completed as_______ I was able to create using modified chop technique quadrants of nucleus so as to remove these quadrants.  Some remnants are made superiorly and these were rotated in position and removed.  Significant thick cortex was noted.  _____I/A _____ was carried out with uneventfully.  The anterior capsule and posterior capsule remained intact.  At this time, forceps was then used to bend and fold the intraocular lens.  The intraocular lens was folded without difficulty and was slightly enlarged.  Clear corneal wound was then placed into the capsular bag and then rotate to the horizontal position with excellent centration.  No complications occurred.  The Viscoat was mostly aspirated in the posterior and the anterior chamber.  A safety 10-0 nylon suture was then applied to the close clear corneal and knot was rotated ____to bury. Then the______ infusion cannula was placed in __inferotemporal________ quadrant.  Central vitrectomy had been previously done and needed no further work.  There were some thin areas in the peripheral retina which could be treated with further laser and particularly in the supratemporal quadrant and this was carried out without difficulty with endolaser panphotocoagulation panretinally. Notably, the macula and the optic nerve were freed of all medial passage.  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 The cannulas____ was removed from the eye and the superior _____sclerotomies_____ were then closed with 7-0 Vicryl suture.  Conjunctival peritomy fashioned  __________ superiotemporally. quadrant_________ was opened with [employer]. A model FP7 of mid-Ahmed valve then primed with a balance salt solution directly and was -placed in a supratemporal quadrant and secured the foot plate ______with __8-0 ylon sutures.  Tube  was trimmed to the appropriate length.  Scleral tunnel was fashioned with a crescent blade into the clear cornea and the anterior chamber was then entered using the MVR blade under the flap and the tube was placed into the anterior chamber, bevelled down posteriorly.  Tubiplast was then used to secure the tube entry site the limbus and 4 vertical mattress 7-0 sutures were then used to secure the scleral tutoplast_ scleral reinforcement graft. Conjunctivae was then brought forward and then closed in interrupted fashion into the limbus overlying the patch graft. No complications occurred.  Subconjunctival Decadron applied.  Sterile patch and Fox shield applied.  The patient tolerated the procedure without complications and taken to PACU.     Alford Highland Jamai Dolce, M.D.     GAR/MEDQ  D:  11/09/2011  T:  11/10/2011  Job:  [id number]

## 2011-11-10 NOTE — Anesthesia Postprocedure Evaluation (Signed)
  Anesthesia Post-op Note  Patient: [name]  Procedure(s) Performed: Procedure(s) (LRB): CATARACT EXTRACTION PHACO AND INTRAOCULAR LENS PLACEMENT (IOC) (Right) PARS PLANA VITRECTOMY WITH 23 GAUGE (Right)  Patient Location: PACU  Anesthesia Type: General  Level of Consciousness: awake, alert  and oriented  Airway and Oxygen Therapy: Patient Spontanous Breathing and Patient connected to nasal cannula oxygen  Post-op Pain: mild  Post-op Assessment: Post-op Vital signs reviewed and Patient's Cardiovascular Status Stable  Post-op Vital Signs: stable  Complications: No apparent anesthesia complications

## 2011-11-12 ENCOUNTER — Other Ambulatory Visit (INDEPENDENT_AMBULATORY_CARE_PROVIDER_SITE_OTHER): Payer: Self-pay | Admitting: Internal Medicine

## 2011-11-12 ENCOUNTER — Other Ambulatory Visit: Payer: Self-pay | Admitting: Medical

## 2011-11-12 IMAGING — CR DG ABDOMEN ACUTE W/ 1V CHEST
3 series · 3 of 3 positions shown · non-contrast
Comparison: 10/07/2010

CLINICAL DATA: Abdominal pain, vomiting, cough, congestion,
history gastroparesis per patient

ACUTE ABDOMEN SERIES (ABDOMEN 2 VIEW & CHEST 1 VIEW)

[view not recorded (1 of 3)]
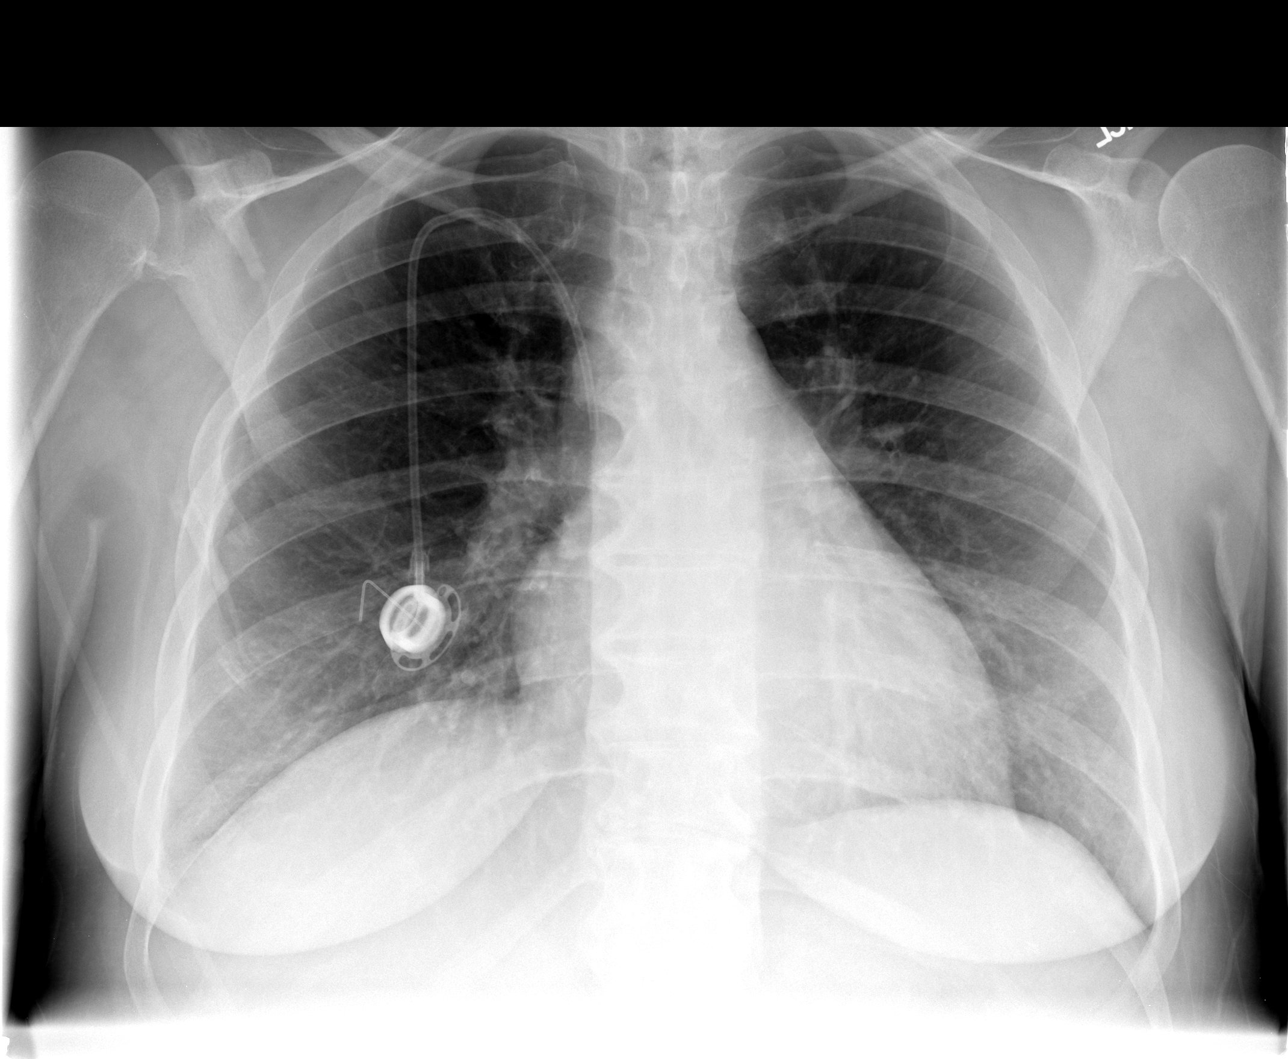

[view not recorded (2 of 3)]
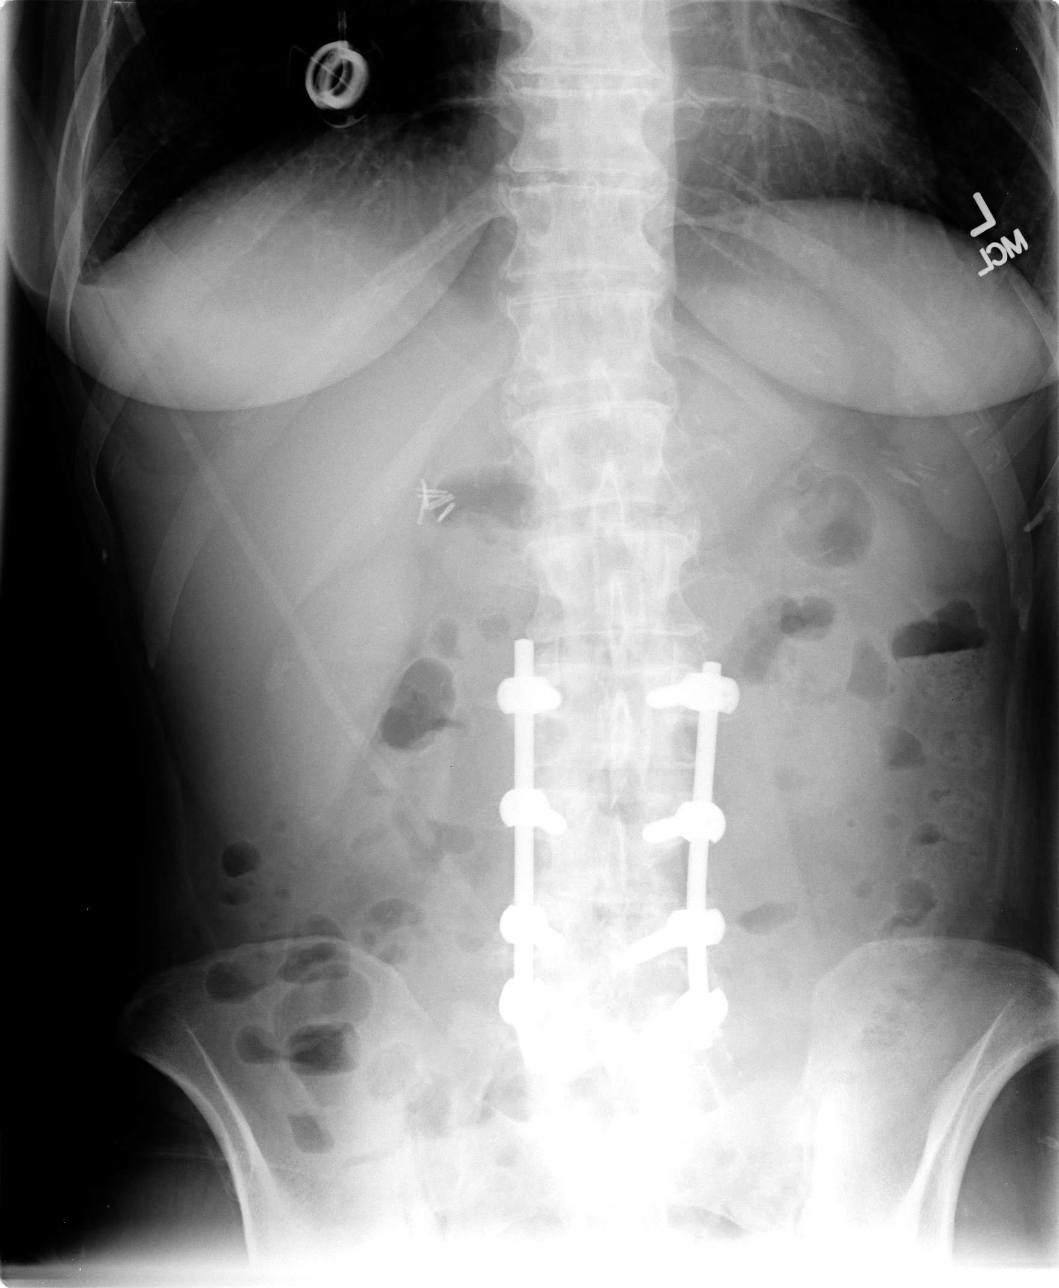

[view not recorded (3 of 3)]
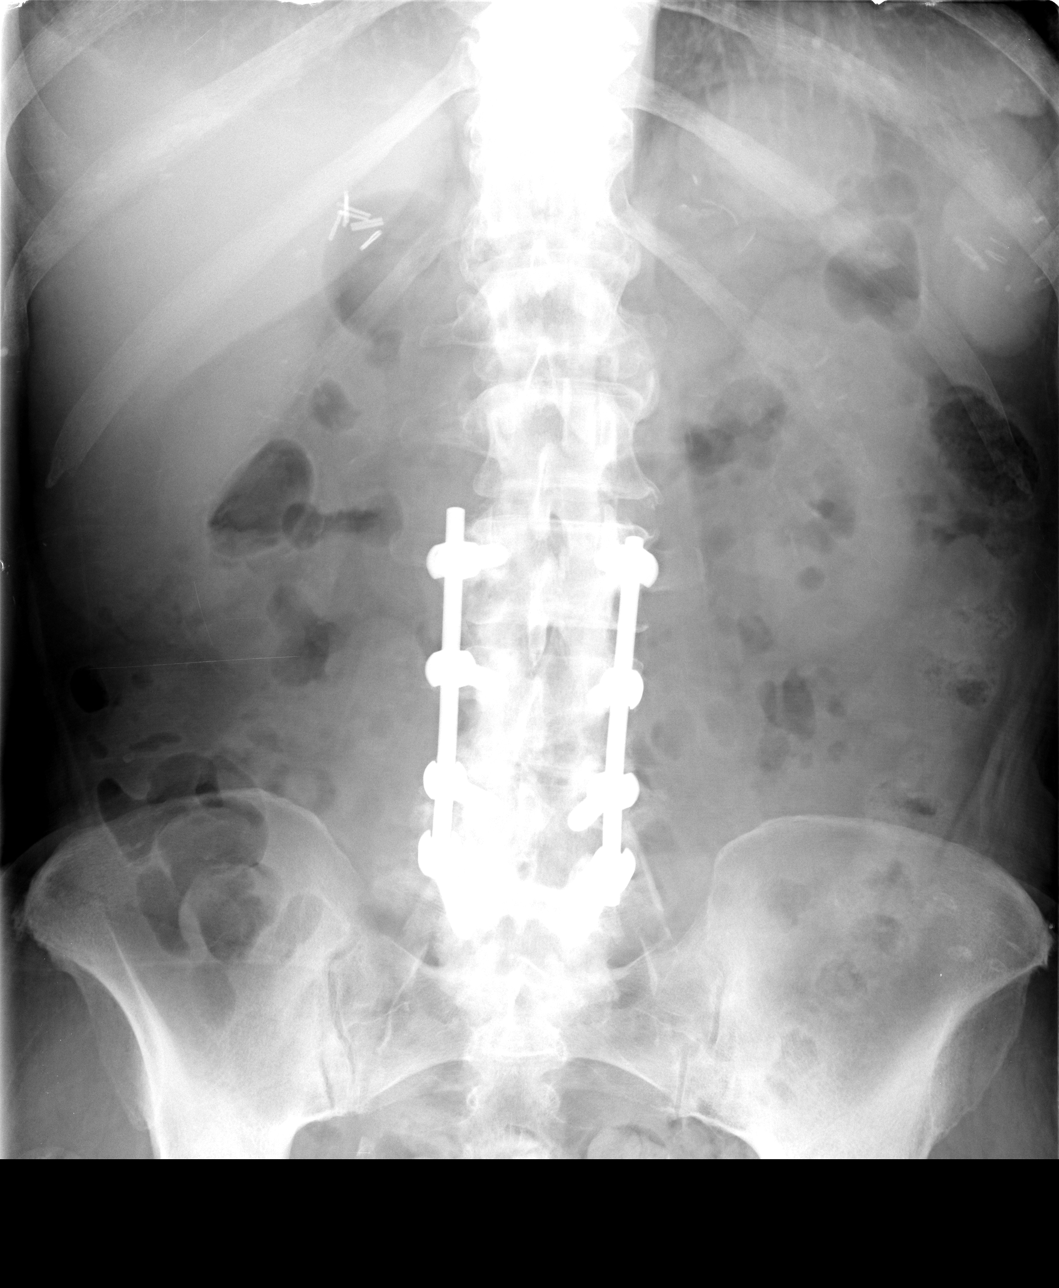

[3 of 3 positions shown; findings below may reference images not displayed]

FINDINGS: Right subclavian Port-A-Cath stable.
Upper normal-size of cardiac silhouette.
Mediastinal contours and pulmonary vascularity normal.
Mild chronic bronchitic changes.
No pulmonary infiltrate or pleural effusion.
Prior lumbar fusion.
Surgical clips right upper quadrant question cholecystectomy.
Nonobstructive bowel gas pattern.
No bowel dilatation, bowel wall thickening, or free intraperitoneal
air.
Scattered atherosclerotic calcifications, premature for age.
No definite urinary tract calcification.
IMPRESSION: Nonobstructive bowel gas pattern.
Mild chronic bronchitic changes.
Premature atherosclerotic calcifications for age.
Prior lumbar fusion and question cholecystectomy.

## 2011-11-12 NOTE — Progress Notes (Signed)
Spoke with caregiver....she answered questions honestly and fairly

## 2011-11-13 ENCOUNTER — Encounter (HOSPITAL_COMMUNITY): Payer: Self-pay | Admitting: Ophthalmology

## 2011-11-13 ENCOUNTER — Other Ambulatory Visit (INDEPENDENT_AMBULATORY_CARE_PROVIDER_SITE_OTHER): Payer: Self-pay | Admitting: *Deleted

## 2011-11-13 NOTE — Telephone Encounter (Signed)
[facility] has requested a refill on Carafate Suspension, take 2 teaspoonfuls 4 times daily, 1 hour before meals and at bedtime

## 2011-11-15 MED ORDER — SUCRALFATE 1 GM/10ML PO SUSP: 1.0000 g | Freq: Four times a day (QID) | ORAL | Status: DC

## 2011-11-19 ENCOUNTER — Ambulatory Visit: Payer: Medicare Other | Admitting: Surgery

## 2011-11-19 ENCOUNTER — Other Ambulatory Visit: Payer: Medicare Other

## 2011-11-26 ENCOUNTER — Ambulatory Visit (INDEPENDENT_AMBULATORY_CARE_PROVIDER_SITE_OTHER): Payer: Medicare Other | Admitting: Internal Medicine

## 2011-11-26 ENCOUNTER — Encounter (INDEPENDENT_AMBULATORY_CARE_PROVIDER_SITE_OTHER): Payer: Self-pay | Admitting: Internal Medicine

## 2011-11-26 VITALS — BP 154/70 | HR 68 | Temp 98.2°F | Ht 70.0 in | Wt 180.0 lb

## 2011-11-26 DIAGNOSIS — K3184 Gastroparesis: Secondary | ICD-10-CM

## 2011-11-26 DIAGNOSIS — K219 Gastro-esophageal reflux disease without esophagitis: Secondary | ICD-10-CM

## 2011-11-26 DIAGNOSIS — E119 Type 2 diabetes mellitus without complications: Secondary | ICD-10-CM

## 2011-11-26 DIAGNOSIS — R110 Nausea: Secondary | ICD-10-CM

## 2011-11-26 DIAGNOSIS — R1110 Vomiting, unspecified: Secondary | ICD-10-CM

## 2011-11-26 NOTE — Patient Instructions (Signed)
PR in 2 weeks. If symptoms worsen, go to the ED. PHenergan 25mg  IM q 6 hrs as needed . 30 doses.

## 2011-11-26 NOTE — Progress Notes (Signed)
Subjective:     Patient ID: [name], female   DOB: 1971-07-27, 41 y.o.   MRN: [id number]  HPI [name] is a 41 yr old female presenting today with nausea and vomiting. She says her stomach is burning. She has a hx of chronic nausea. Symptoms worse for last 2-3 weeks. She has been out of her Phenergan INJ. For about a month.  Appetite is okay. Weight loss of 10 pounds intentional.  She c/o epigastric pain which she describes as being on fire. She has tried a GI cocktail in the past but th is has not helped.  Hx of gatroparesis.  She is scheduled 12/10/2011 for surgery for blockage in both her legs. Review of Systems see hpi   Past Medical History  Diagnosis Date  . DVT (deep venous thrombosis)     DVT HISTORY  . Diabetes mellitus   . Migraine     HISTORY  . Anxiety and depression   . CAD (coronary artery disease)   . Bipolar 1 disorder   . Hyperlipidemia   . Hyperparathyroidism   . PE (pulmonary embolism)     HISTORY  . Dialysis patient   . Anemia   . Hypertension   . Myocardial infarction   . Renal failure     dialysis [place] t,th sat  . Nephrotic syndrome     IN THE PAST  . Peripheral vascular disease   . MRSA infection   . Anxiety   . Depression   . Gastroparesis    Past Surgical History  Procedure Date  . Back surgery X 4  . Cholecystectomy   . Incise and drain abcess     OF THIGHS FROM INSULIN INJECTIONS  . Heart stents     3 - 4  STENTS PLACED  . Portacath placement 2003  . Eye surgery   . Cataract extraction w/phaco 11/09/2011    Procedure: CATARACT EXTRACTION PHACO AND INTRAOCULAR LENS PLACEMENT (IOC);  Surgeon: Edmon Crape, MD;  Location[facility] OR;  Service: Ophthalmology;  Laterality: Right;  . Pars plana vitrectomy 11/09/2011    Procedure: PARS PLANA VITRECTOMY WITH 23 GAUGE;  Surgeon: Edmon Crape, MD;  Location: [facility] OR;  Service: Ophthalmology;  Laterality: Right;  Ahmed Valve; Scleral Reinforcement Graft  . Dg av dialysis  shunt access exist*l* or     History   Social History  . Marital Status: Divorced    Spouse Name: N/A    Number of Children: N/A  . Years of Education: N/A   Occupational History  . Not on file.   Social History Main Topics  . Smoking status: Current Everyday Smoker -- 2.0 packs/day for 20 years    Types: Cigarettes  . Smokeless tobacco: Never Used   Comment: 1 pack a day  . Alcohol Use: No  . Drug Use: No  . Sexually Active: No   Other Topics Concern  . Not on file   Social History Narrative  . No narrative on file   Family Status  Relation Status Death Age  . Mother Alive     mini strokes  . Father Alive   . Sister Alive     Hepatitis C  . Brother Alive   . Sister Alive     Back Problems  . Other Deceased    Allergies  Allergen Reactions  . Compazine (Prochlorperazine) Swelling    Per echart records  . Contrast Media (Iodinated Diagnostic Agents)     Per echart records  .  Meperidine Hcl Swelling  . Metformin And Related Other (See Comments)    'just not myself, was told not to take metformin'  . Morphine Swelling  . Nubain (Nalbuphine Hcl) Swelling  . Topiramate (Topamax) Swelling    Tongue swelling - per echart records  . Toradol (Ketorolac Tromethamine) Swelling       Objective:   Physical Exam Filed Vitals:   11/26/11 1631  Height: 5\' 10"  (1.778 m)  Weight: 180 lb (81.647 kg)   Alert and oriented. Skin warm and dry. Oral mucosa is moist.   . Sclera anicteric, conjunctivae is pink. Thyroid not enlarged. No cervical lymphadenopathy.Bilateral wheezes. Heart regular rate and rhythm.  Abdomen is soft. Bowel sounds are positive. Noted hard areas to abdomen for SQ insulin injection. No hepatomegaly. No abdominal masses felt. No tenderness. Edema to lower extemities.. Patient is alert and oriented. (Patient examined from Wheel chair). She is unable to ambulate.     Assessment:    Chronic nausea and vomiting. Gastroparesis. No acute symptoms at this time.    Plan:    Rx  Phenergan IM 25mg  x 30 doses. Call with a progress report. In 2 weeks.

## 2011-11-27 ENCOUNTER — Other Ambulatory Visit (HOSPITAL_COMMUNITY): Payer: Self-pay | Admitting: Nephrology

## 2011-11-27 DIAGNOSIS — N186 End stage renal disease: Secondary | ICD-10-CM

## 2011-11-28 ENCOUNTER — Encounter (HOSPITAL_COMMUNITY): Payer: Self-pay

## 2011-11-28 ENCOUNTER — Ambulatory Visit (HOSPITAL_COMMUNITY)
Admission: RE | Admit: 2011-11-28 | Discharge: 2011-11-28 | Disposition: A | Discharge status: Home / Self Care | Payer: Medicare Other | Source: Ambulatory Visit | Attending: Nephrology | Admitting: Nephrology

## 2011-11-28 ENCOUNTER — Other Ambulatory Visit (HOSPITAL_COMMUNITY): Payer: Self-pay | Admitting: Nephrology

## 2011-11-28 VITALS — BP 203/94 | HR 73 | Temp 98.1°F | Resp 15

## 2011-11-28 DIAGNOSIS — E119 Type 2 diabetes mellitus without complications: Secondary | ICD-10-CM | POA: Insufficient documentation

## 2011-11-28 DIAGNOSIS — N186 End stage renal disease: Secondary | ICD-10-CM

## 2011-11-28 DIAGNOSIS — Z992 Dependence on renal dialysis: Secondary | ICD-10-CM | POA: Insufficient documentation

## 2011-11-28 DIAGNOSIS — I120 Hypertensive chronic kidney disease with stage 5 chronic kidney disease or end stage renal disease: Secondary | ICD-10-CM | POA: Insufficient documentation

## 2011-11-28 DIAGNOSIS — T82898A Other specified complication of vascular prosthetic devices, implants and grafts, initial encounter: Secondary | ICD-10-CM | POA: Insufficient documentation

## 2011-11-28 DIAGNOSIS — Y849 Medical procedure, unspecified as the cause of abnormal reaction of the patient, or of later complication, without mention of misadventure at the time of the procedure: Secondary | ICD-10-CM | POA: Insufficient documentation

## 2011-11-28 LAB — GLUCOSE, CAPILLARY: Glucose-Capillary: 300 mg/dL — ABNORMAL HIGH (ref 70–99)

## 2011-11-28 MED ORDER — IOHEXOL 300 MG/ML  SOLN
100.0000 mL | Freq: Once | INTRAMUSCULAR | Status: AC | PRN
  Administered 2011-11-28: 80 mL via INTRAVENOUS

## 2011-11-28 MED ORDER — ALTEPLASE 100 MG IV SOLR
2.0000 mg | Freq: Once | INTRAVENOUS | Status: DC
  Filled 2011-11-28: qty 2

## 2011-11-28 MED ORDER — MIDAZOLAM HCL 2 MG/2ML IJ SOLN
INTRAMUSCULAR | Status: AC
  Filled 2011-11-28: qty 2

## 2011-11-28 MED ORDER — FENTANYL CITRATE 0.05 MG/ML IJ SOLN
INTRAMUSCULAR | Status: AC
  Filled 2011-11-28: qty 2

## 2011-11-28 MED ORDER — HEPARIN SODIUM (PORCINE) 1000 UNIT/ML IJ SOLN
INTRAMUSCULAR | Status: AC | PRN
  Administered 2011-11-28: 2000 [IU] via INTRAVENOUS
  Administered 2011-11-28: 3000 [IU] via INTRAVENOUS

## 2011-11-28 MED ORDER — HEPARIN SODIUM (PORCINE) 1000 UNIT/ML IJ SOLN
INTRAMUSCULAR | Status: AC
  Filled 2011-11-28: qty 1

## 2011-11-28 MED ORDER — ALTEPLASE 100 MG IV SOLR
INTRAVENOUS | Status: AC | PRN
  Administered 2011-11-28: 3 mg

## 2011-11-28 NOTE — ED Notes (Signed)
Discharge instructions reviewed with patient and daughter. No questions. Report to [facility]

## 2011-11-28 NOTE — ED Notes (Signed)
According to [employer] rad tech that her and A Suzie Portela verified that patient had been premedicated with Benadryl and Prednisone for contrast allergy last night and this am

## 2011-11-28 NOTE — ED Notes (Signed)
Dressing left arm clean dry and intact. Drinking a coke. Dr. Grace Isaac aware still drowsy, OK for her to go home.

## 2011-11-28 NOTE — ED Notes (Signed)
Milinda Pointer RN removed micro puncture IV

## 2011-11-28 NOTE — ED Notes (Addendum)
Dr. Grace Isaac in and aware of CBG 300, and that patient very drowsy and aware of sedating meds she had last night and this am. IV attempts times 3 per Rosey Bath RN, IV team called

## 2011-11-28 NOTE — ED Notes (Addendum)
Discharged in w/c with Milinda Pointer RN to be driven home by family

## 2011-11-28 NOTE — ED Notes (Addendum)
I

## 2011-11-28 NOTE — ED Notes (Signed)
Verified with sister that patient was premedicated for contrast allergy with Prednisone and Benadryl last night and this am

## 2011-11-28 NOTE — Procedures (Signed)
Technically successful declot of left upper arm dialysis graft, necessitating the placement of an overlapping stent within the main outflow draining vein.  No immediate post procedural complications.

## 2011-11-28 NOTE — Discharge Instructions (Addendum)
Arteriovenous (AV) Access for Hemodialysis An arteriovenous (AV) access is a surgically created or placed tube that allows for repeated access to the blood in your body. This access is required for hemodialysis, a type of dialysis. Dialysis is a treatment process that filters and cleans the blood in order to eliminate toxic wastes from the body when the kidneys fail to do this on their own. There are several different access methods used for hemodialysis. ACCESS METHODS  Double lumen catheter. A flexible tube with 2 channels may be used on a temporary or long-term basis. A long-term use catheter often has a cuff that holds it in place. The catheter is surgically placed and tunneled under the skin. This catheter is often placed when dialysis is needed in an emergency, such as when the kidneys suddenly stop working. It may also be needed when a permanent AV access fails or has not yet been placed. A catheter is usually placed into one of the following:   Large vein under your collarbone (subclavian vein).   Large vein in your neck (jugular vein).   Temporarily, in the large vein in your groin (femoral vein).   AV graft. A man-made (synthetic) material may be used to connect an artery and vein in the arm or thigh. This graft takes around 2 weeks to develop (mature) and is often placed a few weeks prior to use. A graft can generally last from 1 to 2 years.   AV fistula. A minor surgical procedure may be done to connect an artery and vein, creating a fistula. This causes arterial blood to flow directly into a vein. The vein gets larger, allowing easier access for dialysis. The fistula takes around 12 weeks to mature and must be placed several months before dialysis is anticipated. A fistula provides the best access for hemodialysis and can last for several years.  It is critical that veins in patients at high risk to develop kidney failure are preserved. This may include avoiding blood pressure checks and  intravenous (IV) or lab draws from the arm. This maximizes the chance for creating a functioning AV access when needed. Although a fistula is the most desirable access to use for hemodialysis, it may not be possible. If the veins are not large enough or there is no time to wait for a fistula to mature, a graft or catheter may be used. RISKS AND COMPLICATIONS   Double lumen catheter. A catheter may develop serious infections. It may also develop a clot (thrombosis) and fail. A catheter can cause clots in the vein in which it is placed. A subclavian vein catheter is the most likely to cause thrombosis. When the subclavian vein clots, it makes it very difficult for the patient to sustain an AV graft or fistula. When possible, catheters should be avoided and a more permanent AV access should be placed.   AV graft. A graft may swell after surgery, but this should decrease as it heals. A graft may stop working properly due to thrombosis or the diameter of the tube getting smaller. The tube can eventually become blocked (stenosis). If a partial blockage is found relatively early, it can be treated. Left untreated, the stenosis will progress until the vessel is completely blocked. Infection may also occur.   AV fistula. Infection and thrombosis are the biggest risks with a fistula. However, the rates for infection and stenosis are lower with fistulas than the other 2 methods. Frequent thrombosis may require creating a backup fistula at another site.   This will allow for dialysis when one access is blocked.  HOME CARE INSTRUCTIONS   Keep the site of the cut (incision) clean and dry while it heals. This helps prevent infection.   If you have a catheter, do not shower while the incision is healing. After it is healed, ask your caregiver for recommendations on showering. The bandage (dressing) will be changed at the dialysis center. Do not remove this dressing. However, if it becomes wet or loose, a sterile gauze  dressing may be placed over the site and secured by placing adhesive tape on the edges.   If you have a graft or fistula, clean the site daily until it heals completely. Stitches will be removed, usually after about 10 to 14 days. Once the access is being used for dialysis, a small dressing will be placed over the needle sites after the treatment is done. Keep this dressing on for at least 12 hours. Keep your arm or thigh clean and dry during this time.   A small amount of bleeding is normal, especially if the access is new. If you have a large amount of bleeding and cannot stop it, this is not normal. Call your caregiver right away. You will be taught how to hold your graft or fistula sites to stop the bleeding.  If you have a graft or fistula:  A "bruit" is a noise that is heard with a stethoscope and a "thrill" is a vibration felt over the graft or fistula. The presence of the bruit and thrill indicate that the access is working. You will be taught to feel for the thrill each day. If this is not felt, the access may be clotted. Call your caregiver.   You may use the arm freely after the site heals. Keep the following in mind:   Avoid pressure on the arm.   Avoid lifting heavy objects with the arm.   Avoid sleeping on the arm with the graft or fistula.   Avoid wearing tight-sleeved shirts or jewelry around the graft or fistula.   Do not allow blood pressure monitoring or needle punctures on the side where the graft or fistula is located.   With permission from your caregiver, you may do exercises to help with blood flow through a fistula. These exercises involve squeezing a rubber ball or other soft objects as instructed.  SEEK MEDICAL CARE IF:   Chills develop.   You have an oral temperature above 102 F (38.9 C).   Swelling around the graft or fistula gets worse.   New pain develops.   Unusual bleeding develops.   Pus or other fluid (drainage) is seen at the AV access site.    Skin redness or red streaking is seen on the skin around, above, or below the AV access.  SEEK IMMEDIATE MEDICAL CARE IF:   Pain, numbness, or an unusual pale skin color develops in the hand on the side of your fistula.   Dizziness or weakness develops that you have not had before.   Shortness of breath develops.   Chest pain develops.   The AV access has bleeding that cannot be easily controlled.  Wear a medical alert bracelet to let caregivers know you are a dialysis patient, so they can care for your veins appropriately. Document Released: 11/03/2002 Document Revised: 08/02/2011 Document Reviewed: 01/10/2010

## 2011-11-28 NOTE — H&P (Signed)
[name] is an 41 y.o. female.   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  Last received dialysis on Sat.  Patient is heavily sedated with home medications and history and consent obtained from pt's sister.  Per sister, no change in pt's baseline health.  No CP, SOB, fever or chills.  Completed steroid prep for contrast allergy.  Past Medical History  Diagnosis Date  . DVT (deep venous thrombosis)     DVT HISTORY  . Diabetes mellitus   . Migraine     HISTORY  . Anxiety and depression   . CAD (coronary artery disease)   . Bipolar 1 disorder   . Hyperlipidemia   . Hyperparathyroidism   . PE (pulmonary embolism)     HISTORY  . Dialysis patient   . Anemia   . Hypertension   . Myocardial infarction   . Renal failure     dialysis [place] t,[facility] sat  . Nephrotic syndrome     IN THE PAST  . Peripheral vascular disease   . MRSA infection   . Anxiety   . Depression   . Gastroparesis     Past Surgical History  Procedure Date  . Back surgery X 4  . Cholecystectomy   . Incise and drain abcess     OF THIGHS FROM INSULIN INJECTIONS  . Heart stents     3 - 4  STENTS PLACED  . Portacath placement 2003  . Eye surgery   . Cataract extraction w/phaco 11/09/2011    Procedure: CATARACT EXTRACTION PHACO AND INTRAOCULAR LENS PLACEMENT (IOC);  Surgeon: Gary A Rankin, MD;  Location: [facility] OR;  Service: Ophthalmology;  Laterality: Right;  . Pars plana vitrectomy 11/09/2011    Procedure: PARS PLANA VITRECTOMY WITH 23 GAUGE;  Surgeon: Gary A Rankin, MD;  Location: [facility] OR;  Service: Ophthalmology;  Laterality: Right;  Ahmed Valve; Scleral Reinforcement Graft  . Dg av dialysis  shunt access exist*l* or     Family History  Problem Relation Age of Onset  . Transient ischemic attack Mother   . Hepatitis Sister   . Diabetes type II Other    Social History:  reports that she has been  smoking Cigarettes.  She has a 40 pack-year smoking history. She has never used smokeless tobacco. She reports that she does not drink alcohol or use illicit drugs.  Allergies:  Allergies  Allergen Reactions  . Compazine (Prochlorperazine) Swelling    Per echart records  . Contrast Media (Iodinated Diagnostic Agents)     Per echart records  . Meperidine Hcl Swelling  . Metformin And Related Other (See Comments)    'just not myself, was told not to take metformin'  . Morphine Swelling  . Nubain (Nalbuphine Hcl) Swelling  . Topiramate (Topamax) Swelling    Tongue swelling - per echart records  . Toradol (Ketorolac Tromethamine) Swelling      Results for orders placed during the hospital encounter of 11/28/11 (from the past 48 hour(s))  GLUCOSE, CAPILLARY     Status: Abnormal   Collection Time   11/28/11  7:53 AM      Component Value Range Comment   Glucose-Capillary 300 (*) 70 - 99 (mg/dL)    No results found.  Review of Systems  Unable to perform ROS: other    Blood pressure 181/79, pulse 67, temperature 98.1 F (36.7 C), temperature source   Oral, resp. rate 16, last menstrual period 09/03/2008, SpO2 97.00%. Physical Exam  Constitutional: She appears well-developed and well-nourished.  Cardiovascular: Normal rate, regular rhythm and normal heart sounds.   Respiratory: Effort normal and breath sounds normal.  Psychiatric:       Heavily sedated from home medications.     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  History of contrast allergy, but has completed steroid prep.  Patient heavily sedated from home medications.  Informed written consent obtained from pt's sister after discussion of benefits and risks (including but not limited to bleeding, infection, vessel injury) of declot procedure with angioplasty, possible stent or HD catheter placement.  [name] 11/28/2011, 8:43 AM    

## 2011-11-28 NOTE — ED Notes (Signed)
Micro puncture placed per Dr Grace Isaac right upper arm.

## 2011-11-28 NOTE — ED Notes (Addendum)
Drowsy and mainly sleeping in chair. Family states has insomnia and did not sleep last night. Took her Xanax last night, around 4 am and before coming here. Also took Temazapam at hs, Fiorocet hs and am, Percocet hs and pm, Trazodone 200mg  at hs. CBG 300. Does wake to voice and answers questions and is cooperative.

## 2011-11-28 NOTE — ED Notes (Signed)
Having some pain right upper arm, Dr Grace Isaac aware and told patient this is to be expected as this was where he was working.

## 2011-11-28 NOTE — ED Notes (Signed)
Discharge instructions reviewed with patient and sister. Have no questions. Report to [facility]

## 2011-11-29 ENCOUNTER — Telehealth (HOSPITAL_COMMUNITY): Payer: Self-pay

## 2011-11-30 ENCOUNTER — Other Ambulatory Visit (INDEPENDENT_AMBULATORY_CARE_PROVIDER_SITE_OTHER): Payer: Self-pay | Admitting: Internal Medicine

## 2011-12-01 ENCOUNTER — Other Ambulatory Visit (HOSPITAL_COMMUNITY): Payer: Self-pay | Admitting: Nephrology

## 2011-12-01 DIAGNOSIS — N186 End stage renal disease: Secondary | ICD-10-CM

## 2011-12-03 ENCOUNTER — Other Ambulatory Visit (HOSPITAL_COMMUNITY): Payer: Self-pay | Admitting: Nephrology

## 2011-12-03 ENCOUNTER — Telehealth (HOSPITAL_COMMUNITY): Payer: Self-pay | Admitting: Diagnostic Radiology

## 2011-12-03 ENCOUNTER — Ambulatory Visit (HOSPITAL_COMMUNITY)
Admission: RE | Admit: 2011-12-03 | Discharge: 2011-12-03 | Disposition: A | Discharge status: Home / Self Care | Payer: Medicare Other | Source: Ambulatory Visit | Attending: Nephrology | Admitting: Nephrology

## 2011-12-03 DIAGNOSIS — N186 End stage renal disease: Secondary | ICD-10-CM | POA: Insufficient documentation

## 2011-12-03 DIAGNOSIS — I739 Peripheral vascular disease, unspecified: Secondary | ICD-10-CM | POA: Insufficient documentation

## 2011-12-03 DIAGNOSIS — Z86711 Personal history of pulmonary embolism: Secondary | ICD-10-CM | POA: Insufficient documentation

## 2011-12-03 DIAGNOSIS — Y831 Surgical operation with implant of artificial internal device as the cause of abnormal reaction of the patient, or of later complication, without mention of misadventure at the time of the procedure: Secondary | ICD-10-CM | POA: Insufficient documentation

## 2011-12-03 DIAGNOSIS — E119 Type 2 diabetes mellitus without complications: Secondary | ICD-10-CM | POA: Insufficient documentation

## 2011-12-03 DIAGNOSIS — I120 Hypertensive chronic kidney disease with stage 5 chronic kidney disease or end stage renal disease: Secondary | ICD-10-CM | POA: Insufficient documentation

## 2011-12-03 DIAGNOSIS — Z992 Dependence on renal dialysis: Secondary | ICD-10-CM | POA: Insufficient documentation

## 2011-12-03 DIAGNOSIS — Z86718 Personal history of other venous thrombosis and embolism: Secondary | ICD-10-CM | POA: Insufficient documentation

## 2011-12-03 DIAGNOSIS — T82898A Other specified complication of vascular prosthetic devices, implants and grafts, initial encounter: Secondary | ICD-10-CM | POA: Insufficient documentation

## 2011-12-03 MED ORDER — IOHEXOL 300 MG/ML  SOLN
100.0000 mL | Freq: Once | INTRAMUSCULAR | Status: AC | PRN
  Administered 2011-12-03: 80 mL via INTRAVENOUS

## 2011-12-03 MED ORDER — PREDNISONE 20 MG PO TABS
50.0000 mg | ORAL_TABLET | Freq: Once | ORAL | Status: AC
  Administered 2011-12-03: 50 mg via ORAL
  Filled 2011-12-03: qty 2.5

## 2011-12-03 MED ORDER — DIPHENHYDRAMINE HCL 50 MG PO CAPS
ORAL_CAPSULE | ORAL | Status: AC
  Filled 2011-12-03: qty 1

## 2011-12-03 MED ORDER — MIDAZOLAM HCL 2 MG/2ML IJ SOLN
INTRAMUSCULAR | Status: AC
  Filled 2011-12-03: qty 4

## 2011-12-03 MED ORDER — FENTANYL CITRATE 0.05 MG/ML IJ SOLN
INTRAMUSCULAR | Status: AC
  Filled 2011-12-03: qty 4

## 2011-12-03 MED ORDER — DIPHENHYDRAMINE HCL 25 MG PO CAPS
50.0000 mg | ORAL_CAPSULE | Freq: Once | ORAL | Status: AC
  Administered 2011-12-03: 50 mg via ORAL

## 2011-12-03 NOTE — ED Notes (Signed)
Dialysis center contacted and spoke with charge nurse in reference to potassium result of 5.6. No new orders at this time.

## 2011-12-03 NOTE — H&P (View-Only) (Signed)
[name] is an 41 y.o. female.   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  Last received dialysis on Sat.  Patient is heavily sedated with home medications and history and consent obtained from pt's sister.  Per sister, no change in pt's baseline health.  No CP, SOB, fever or chills.  Completed steroid prep for contrast allergy.  Past Medical History  Diagnosis Date  . DVT (deep venous thrombosis)     DVT HISTORY  . Diabetes mellitus   . Migraine     HISTORY  . Anxiety and depression   . CAD (coronary artery disease)   . Bipolar 1 disorder   . Hyperlipidemia   . Hyperparathyroidism   . PE (pulmonary embolism)     HISTORY  . Dialysis patient   . Anemia   . Hypertension   . Myocardial infarction   . Renal failure     dialysis [place] t,th sat  . Nephrotic syndrome     IN THE PAST  . Peripheral vascular disease   . MRSA infection   . Anxiety   . Depression   . Gastroparesis     Past Surgical History  Procedure Date  . Back surgery X 4  . Cholecystectomy   . Incise and drain abcess     OF THIGHS FROM INSULIN INJECTIONS  . Heart stents     3 - 4  STENTS PLACED  . Portacath placement 2003  . Eye surgery   . Cataract extraction w/phaco 11/09/2011    Procedure: CATARACT EXTRACTION PHACO AND INTRAOCULAR LENS PLACEMENT (IOC);  Surgeon: Edmon Crape, MD;  Location: [facility] OR;  Service: Ophthalmology;  Laterality: Right;  . Pars plana vitrectomy 11/09/2011    Procedure: PARS PLANA VITRECTOMY WITH 23 GAUGE;  Surgeon: Edmon Crape, MD;  Location: [facility] OR;  Service: Ophthalmology;  Laterality: Right;  Ahmed Valve; Scleral Reinforcement Graft  . Dg av dialysis  shunt access exist*l* or     Family History  Problem Relation Age of Onset  . Transient ischemic attack Mother   . Hepatitis Sister   . Diabetes type II Other    Social History:  reports that she has been  smoking Cigarettes.  She has a 40 pack-year smoking history. She has never used smokeless tobacco. She reports that she does not drink alcohol or use illicit drugs.  Allergies:  Allergies  Allergen Reactions  . Compazine (Prochlorperazine) Swelling    Per echart records  . Contrast Media (Iodinated Diagnostic Agents)     Per echart records  . Meperidine Hcl Swelling  . Metformin And Related Other (See Comments)    'just not myself, was told not to take metformin'  . Morphine Swelling  . Nubain (Nalbuphine Hcl) Swelling  . Topiramate (Topamax) Swelling    Tongue swelling - per echart records  . Toradol (Ketorolac Tromethamine) Swelling      Results for orders placed during the hospital encounter of 11/28/11 (from the past 48 hour(s))  GLUCOSE, CAPILLARY     Status: Abnormal   Collection Time   11/28/11  7:53 AM      Component Value Range Comment   Glucose-Capillary 300 (*) 70 - 99 (mg/dL)    No results found.  Review of Systems  Unable to perform ROS: other    Blood pressure 181/79, pulse 67, temperature 98.1 F (36.7 C), temperature source  Oral, resp. rate 16, last menstrual period 09/03/2008, SpO2 97.00%. Physical Exam  Constitutional: She appears well-developed and well-nourished.  Cardiovascular: Normal rate, regular rhythm and normal heart sounds.   Respiratory: Effort normal and breath sounds normal.  Psychiatric:       Heavily sedated from home medications.     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  History of contrast allergy, but has completed steroid prep.  Patient heavily sedated from home medications.  Informed written consent obtained from pt's sister after discussion of benefits and risks (including but not limited to bleeding, infection, vessel injury) of declot procedure with angioplasty, possible stent or HD catheter placement.  Simonne Come 11/28/2011, 8:43 AM

## 2011-12-03 NOTE — Procedures (Signed)
Concern for an occluded left upper arm graft.  Graft is patent by ultrasound.  Shuntogram demonstrated intrastent stenosis and successfully treated with 7 mm balloon.  See radiology report.

## 2011-12-03 NOTE — Interval H&P Note (Signed)
History and Physical Interval Note:  12/03/2011 10:53 AM  [name]  has presented today for surgery, with the diagnosis of presumed clotted dialysis graft. The various methods of treatment have been discussed with the patient and family. After consideration of risks, benefits and other options for treatment, the patient has consented to shuntogram and possible declot as a surgical intervention .  The patients' history has been reviewed, patient examined, no change in status, stable for surgery.  I have reviewed the patients' chart and labs.  Questions were answered to the patient's satisfaction.   She does again have contrast allergy but has taken her premedication regimen as directed.   Barbee Shropshire [facility] 12/03/2011 10:54 AM

## 2011-12-04 ENCOUNTER — Encounter: Payer: Self-pay | Admitting: Cardiology

## 2011-12-04 ENCOUNTER — Telehealth (HOSPITAL_COMMUNITY): Payer: Self-pay

## 2011-12-04 ENCOUNTER — Encounter: Payer: Self-pay | Admitting: *Deleted

## 2011-12-04 ENCOUNTER — Ambulatory Visit (INDEPENDENT_AMBULATORY_CARE_PROVIDER_SITE_OTHER): Payer: Medicare Other | Admitting: Cardiology

## 2011-12-04 VITALS — BP 94/63 | HR 79 | Ht 70.0 in | Wt 185.0 lb

## 2011-12-04 DIAGNOSIS — F17200 Nicotine dependence, unspecified, uncomplicated: Secondary | ICD-10-CM

## 2011-12-04 DIAGNOSIS — I10 Essential (primary) hypertension: Secondary | ICD-10-CM

## 2011-12-04 DIAGNOSIS — Z720 Tobacco use: Secondary | ICD-10-CM

## 2011-12-04 DIAGNOSIS — Z01818 Encounter for other preprocedural examination: Secondary | ICD-10-CM

## 2011-12-04 DIAGNOSIS — I2510 Atherosclerotic heart disease of native coronary artery without angina pectoris: Secondary | ICD-10-CM

## 2011-12-04 LAB — GLUCOSE, CAPILLARY: Glucose-Capillary: 239 mg/dL — ABNORMAL HIGH (ref 70–99)

## 2011-12-04 NOTE — Assessment & Plan Note (Signed)
The blood pressure is at target. No change in medications is indicated. We will continue with therapeutic lifestyle changes (TLC).  

## 2011-12-04 NOTE — Assessment & Plan Note (Signed)
The patient will need stress testing prior vascular surgery. She will not be able to exercise so she will have a [employer].

## 2011-12-04 NOTE — Patient Instructions (Signed)
   Lexiscan stress test If the results of your test are normal or stable, you will receive a letter.  If they are abnormal, the nurse will contact you by phone. Follow up in  4 months with Dr. Andee Lineman

## 2011-12-04 NOTE — Assessment & Plan Note (Signed)
She is cutting back but her sister reports she has no ability to quit smoking at this time.

## 2011-12-04 NOTE — Progress Notes (Addendum)
HPI The patient presents for preoperative evaluation prior to possible lower extremity revascularization. She has been found to have right external iliac and bilateral SFA occlusion. She has non-healing leg wounds. She does have known coronary disease. She is dialysis dependent secondary to poorly controlled diabetes. She still smokes 2 packs of cigarettes per day. Prior to her previous percutaneous revascularization she does not recall ever having any symptoms. She does not report chest pressure, neck or arm discomfort. She does have dyspnea with exertion but she's very sedentary. She's not describing PND or orthopnea. She's had no palpitations, presyncope or syncope. She's very somnolent in the office today because her sister says she stays up all night.  Allergies  Allergen Reactions  . Compazine (Prochlorperazine) Swelling    Per echart records  . Contrast Media (Iodinated Diagnostic Agents)     Per echart records  . Meperidine Hcl Swelling  . Metformin And Related Other (See Comments)    'just not myself, was told not to take metformin'  . Morphine Swelling  . Nubain (Nalbuphine Hcl) Swelling  . Topiramate (Topamax) Swelling    Tongue swelling - per echart records  . Toradol (Ketorolac Tromethamine) Swelling    Current Outpatient Prescriptions  Medication Sig Dispense Refill  . albuterol (PROVENTIL HFA;VENTOLIN HFA) 108 (90 BASE) MCG/ACT inhaler Inhale 2 puffs into the lungs every 6 (six) hours as needed. Shortness of breath        . ALPRAZolam (XANAX) 1 MG tablet Take 1 tablet (1 mg total) by mouth 4 (four) times daily as needed for anxiety. For anxiety   30 tablet  0  . amLODipine (NORVASC) 10 MG tablet Take 10 mg by mouth daily.        . ARIPiprazole (ABILIFY) 2 MG tablet Take 2 mg by mouth daily.        . Artificial Tear Ointment (REFRESH P.M. OP) Place 1 application into the right eye 4 (four) times daily.       Marland Kitchen b complex-vitamin c-folic acid (NEPHRO-VITE) 0.8 MG TABS Take  0.8 mg by mouth at bedtime.        . butalbital-acetaminophen-caffeine (FIORICET, ESGIC) 50-325-40 MG per tablet Take 1 tablet by mouth 4 (four) times daily as needed. Take one tablet up to 4 times daily as needed for acute migraine pain      . cinacalcet (SENSIPAR) 30 MG tablet Take 30 mg by mouth daily. Take one tablet daily with evening meal      . cloNIDine (CATAPRES) 0.2 MG tablet Take 0.2 mg by mouth 2 (two) times daily.       . clopidogrel (PLAVIX) 75 MG tablet Take 75 mg by mouth daily.        . Eyelid Cleansers (SYSTANE LID WIPES) PADS Place 1 application into the right eye daily.      . homatropine 5 % ophthalmic solution Place 1 drop into the right eye 3 (three) times daily.      . insulin glargine (LANTUS) 100 UNIT/ML injection Inject 30 Units into the skin at bedtime.      . insulin lispro (HUMALOG) 100 UNIT/ML injection Inject 7-15 Units into the skin 4 (four) times daily -  before meals and at bedtime. TAKING ON SLIDING SCALE 200-249=7 units 250-299=10 units 300-349=12 units >350=15 units      . lisinopril (PRINIVIL,ZESTRIL) 40 MG tablet Take 40 mg by mouth daily.        Marland Kitchen loteprednol (LOTEMAX) 0.5 % ophthalmic suspension Place 1 drop into  the right eye 4 (four) times daily.      . metoCLOPramide (REGLAN) 10 MG tablet TAKE (2) TABLETS FOUR TIMES DAILY.  240 tablet  1  . ofloxacin (OCUFLOX) 0.3 % ophthalmic solution Place 1 drop into the right eye 3 (three) times daily.       . Olopatadine HCl (PATADAY) 0.2 % SOLN Apply 1 drop to eye daily.      Marland Kitchen omeprazole (PRILOSEC) 40 MG capsule TAKE (1) CAPSULE ONCE DAILY  60 capsule  5  . oxyCODONE-acetaminophen (PERCOCET) 10-325 MG per tablet Take 1 tablet by mouth every 4 (four) hours. For pain      . PARoxetine (PAXIL) 40 MG tablet Take 80 mg by mouth every morning.       Bertram Gala Glycol-Propyl Glycol (SYSTANE ULTRA) 0.4-0.3 % SOLN Place 1 drop into both eyes 4 (four) times daily.      . promethazine (PHENERGAN) 25 MG tablet TAKE ONE  TABLET EVERY SIX HOURS AS NEEDED  60 tablet  0  . promethazine (PHENERGAN) 50 MG/ML injection Inject 50 mg into the muscle every 6 (six) hours as needed. For nausea      . sevelamer (RENVELA) 800 MG tablet Take 1,600-4,000 mg by mouth 5 (five) times daily. *Take 5 tablets daily with each meal and take 2 tablets with snacks*      . sodium bicarbonate 650 MG tablet Take 650 mg by mouth 2 (two) times daily.        . sucralfate (CARAFATE) 1 G tablet Take 1 g by mouth 2 (two) times daily before a meal.       . temazepam (RESTORIL) 30 MG capsule Take 30 mg by mouth at bedtime.      . torsemide (DEMADEX) 100 MG tablet Take 100 mg by mouth daily.        . travoprost, benzalkonium, (TRAVATAN) 0.004 % ophthalmic solution Place 1 drop into the right eye at bedtime.      . traZODone (DESYREL) 100 MG tablet Take 200 mg by mouth at bedtime.      Marland Kitchen DISCONTD: promethazine (PHENERGAN) 12.5 MG tablet Take 2 tablets (25 mg total) by mouth 3 (three) times daily before meals.  90 tablet  2    Past Medical History  Diagnosis Date  . DVT (deep venous thrombosis)     DVT HISTORY  . Diabetes mellitus   . Migraine     HISTORY  . Anxiety and depression   . CAD (coronary artery disease)     Last cath 2009:  LAD stent 80% stenosis. Circumflex 60-70% stenosis AV groove, right coronary artery 50-60% stenosis. She did have cutting balloon angioplasty of the LAD lesion into a diagonal. However, there was restenosis of this and it was managed medically.  . Bipolar 1 disorder   . Hyperlipidemia   . Hyperparathyroidism   . PE (pulmonary embolism)     HISTORY  . Dialysis patient   . Anemia   . Hypertension   . Myocardial infarction   . Renal failure     dialysis [place] t,th sat  . Nephrotic syndrome   . Peripheral vascular disease   . MRSA infection   . Anxiety   . Depression   . Gastroparesis     Past Surgical History  Procedure Date  . Back surgery X 4  . Cholecystectomy   . Incise and drain abcess     OF  THIGHS FROM INSULIN INJECTIONS  . Heart stents   . Portacath  placement 2003  . Eye surgery   . Cataract extraction w/phaco 11/09/2011    Procedure: CATARACT EXTRACTION PHACO AND INTRAOCULAR LENS PLACEMENT (IOC);  Surgeon: Edmon Crape, MD;  Location: [facility] OR;  Service: Ophthalmology;  Laterality: Right;  . Pars plana vitrectomy 11/09/2011    Procedure: PARS PLANA VITRECTOMY WITH 23 GAUGE;  Surgeon: Edmon Crape, MD;  Location: [facility] OR;  Service: Ophthalmology;  Laterality: Right;  Ahmed Valve; Scleral Reinforcement Graft  . Dg av dialysis  shunt access exist*l* or     ROS: As stated in the HPI and negative for all other systems.  PHYSICAL EXAM BP 94/63  Pulse 79  Ht 5\' 10"  (1.778 m)  Wt 185 lb (83.915 kg)  BMI 26.54 kg/m2 PHYSICAL EXAM GEN:  No distress, chronically ill-appearing HEENT:  Dentures, anisocoria NECK:  No jugular venous distention at 90 degrees, waveform within normal limits, carotid upstroke brisk and symmetric, no bruits, no thyromegaly LYMPHATICS:  No cervical adenopathy LUNGS:  Clear to auscultation bilaterally BACK:  No CVA tenderness CHEST:  Unremarkable HEART:  S1 and S2 within normal limits, no S3, no S4, no clicks, no rubs, no murmurs ABD:  Positive bowel sounds normal in frequency in pitch, no bruits, no rebound, no guarding, unable to assess midline mass or bruit with the patient seated. EXT:  Absent dorsalis pedis and posterior tibialis bilaterally, moderate edema, no cyanosis no clubbing, chronic venous stasis changes, left upper arm dialysis fistula NEURO:  Cranial nerves II through XII grossly intact, motor grossly intact throughout PSYCH:  Cognitively intact, oriented to person place and time, somnolent  ASSESSMENT AND PLAN

## 2011-12-07 ENCOUNTER — Encounter: Payer: Self-pay | Admitting: Surgery

## 2011-12-10 ENCOUNTER — Ambulatory Visit: Payer: Medicare Other | Admitting: Surgery

## 2011-12-10 ENCOUNTER — Other Ambulatory Visit: Payer: Medicare Other

## 2011-12-12 ENCOUNTER — Other Ambulatory Visit (HOSPITAL_COMMUNITY): Payer: Self-pay | Admitting: Nephrology

## 2011-12-12 ENCOUNTER — Other Ambulatory Visit: Payer: Self-pay | Admitting: Medical

## 2011-12-12 DIAGNOSIS — Z992 Dependence on renal dialysis: Secondary | ICD-10-CM

## 2011-12-13 ENCOUNTER — Other Ambulatory Visit (HOSPITAL_COMMUNITY): Payer: Self-pay | Admitting: Nephrology

## 2011-12-13 ENCOUNTER — Ambulatory Visit (HOSPITAL_COMMUNITY)
Admission: RE | Admit: 2011-12-13 | Discharge: 2011-12-13 | Disposition: A | Discharge status: Home / Self Care | Payer: Medicare Other | Source: Ambulatory Visit | Attending: Nephrology | Admitting: Nephrology

## 2011-12-13 DIAGNOSIS — I120 Hypertensive chronic kidney disease with stage 5 chronic kidney disease or end stage renal disease: Secondary | ICD-10-CM | POA: Insufficient documentation

## 2011-12-13 DIAGNOSIS — Z86718 Personal history of other venous thrombosis and embolism: Secondary | ICD-10-CM | POA: Insufficient documentation

## 2011-12-13 DIAGNOSIS — Z79899 Other long term (current) drug therapy: Secondary | ICD-10-CM | POA: Insufficient documentation

## 2011-12-13 DIAGNOSIS — N186 End stage renal disease: Secondary | ICD-10-CM

## 2011-12-13 DIAGNOSIS — I252 Old myocardial infarction: Secondary | ICD-10-CM | POA: Insufficient documentation

## 2011-12-13 DIAGNOSIS — E785 Hyperlipidemia, unspecified: Secondary | ICD-10-CM | POA: Insufficient documentation

## 2011-12-13 DIAGNOSIS — Z992 Dependence on renal dialysis: Secondary | ICD-10-CM

## 2011-12-13 DIAGNOSIS — E119 Type 2 diabetes mellitus without complications: Secondary | ICD-10-CM | POA: Insufficient documentation

## 2011-12-13 DIAGNOSIS — T82898A Other specified complication of vascular prosthetic devices, implants and grafts, initial encounter: Secondary | ICD-10-CM | POA: Insufficient documentation

## 2011-12-13 DIAGNOSIS — Y832 Surgical operation with anastomosis, bypass or graft as the cause of abnormal reaction of the patient, or of later complication, without mention of misadventure at the time of the procedure: Secondary | ICD-10-CM | POA: Insufficient documentation

## 2011-12-13 HISTORY — PX: DG AV DIALYSIS  SHUNT ACCESS EXIST*L* OR: HXRAD910

## 2011-12-13 MED ORDER — LABETALOL HCL 5 MG/ML IV SOLN
INTRAVENOUS | Status: AC
  Filled 2011-12-13: qty 4

## 2011-12-13 MED ORDER — LIDOCAINE HCL 1 % IJ SOLN
INTRAMUSCULAR | Status: AC
  Filled 2011-12-13: qty 20

## 2011-12-13 MED ORDER — ONDANSETRON HCL 4 MG/2ML IJ SOLN
INTRAMUSCULAR | Status: AC
  Filled 2011-12-13: qty 2

## 2011-12-13 MED ORDER — IOHEXOL 300 MG/ML  SOLN: 75.0000 mL | Freq: Once | INTRAMUSCULAR | Status: AC | PRN

## 2011-12-13 NOTE — Procedures (Signed)
Left upper extremity shuntogram demonstrated multiples areas of narrowing, including a critical graft stenosis near the artery.  The graft stenoses treated with 6 mm balloon.  Intrastent stenoses treated with 7 mm balloon.  No immediate complication.

## 2011-12-13 NOTE — H&P (Signed)
[name] is an 41 y.o. female.   Chief Complaint: Left arm graft stenosis per shuntogram LUE declot 4/4; PTA 4/8 Scheduled now for Left arm graft angioplasty/stent placement and possible dialysis catheter placement if necessary  HPI: ESRD; CAD/MI; DM; Bipolar  Past Medical History  Diagnosis Date  . DVT (deep venous thrombosis)     DVT HISTORY  . Diabetes mellitus   . Migraine     HISTORY  . Anxiety and depression   . CAD (coronary artery disease)     Last cath 2009:  LAD stent 80% stenosis. Circumflex 60-70% stenosis AV groove, right coronary artery 50-60% stenosis. She did have cutting balloon angioplasty of the LAD lesion into a diagonal. However, there was restenosis of this and it was managed medically.  . Bipolar 1 disorder   . Hyperlipidemia   . Hyperparathyroidism   . PE (pulmonary embolism)     HISTORY  . Dialysis patient   . Anemia   . Hypertension   . Myocardial infarction   . Renal failure     dialysis [place] t,th sat  . Nephrotic syndrome   . Peripheral vascular disease   . MRSA infection   . Anxiety   . Depression   . Gastroparesis     Past Surgical History  Procedure Date  . Back surgery X 4  . Cholecystectomy   . Incise and drain abcess     OF THIGHS FROM INSULIN INJECTIONS  . Heart stents   . Portacath placement 2003  . Eye surgery   . Cataract extraction w/phaco 11/09/2011    Procedure: CATARACT EXTRACTION PHACO AND INTRAOCULAR LENS PLACEMENT (IOC);  Surgeon: Edmon Crape, MD;  Location: [facility] OR;  Service: Ophthalmology;  Laterality: Right;  . Pars plana vitrectomy 11/09/2011    Procedure: PARS PLANA VITRECTOMY WITH 23 GAUGE;  Surgeon: Edmon Crape, MD;  Location: [facility] OR;  Service: Ophthalmology;  Laterality: Right;  Ahmed Valve; Scleral Reinforcement Graft  . Dg av dialysis  shunt access exist*l* or     Family History  Problem Relation Age of Onset  . Transient ischemic attack Mother   . Hepatitis Sister   . Diabetes type II Other     Social History:  reports that she has been smoking Cigarettes.  She has a 40 pack-year smoking history. She has never used smokeless tobacco. She reports that she does not drink alcohol or use illicit drugs.  Allergies:  Allergies  Allergen Reactions  . Compazine (Prochlorperazine) Swelling    Per echart records  . Contrast Media (Iodinated Diagnostic Agents)     Per echart records  . Meperidine Hcl Swelling  . Metformin And Related Other (See Comments)    'just not myself, was told not to take metformin'  . Morphine Swelling  . Nubain (Nalbuphine Hcl) Swelling  . Topiramate (Topamax) Swelling    Tongue swelling - per echart records  . Toradol (Ketorolac Tromethamine) Swelling      No results found for this or any previous visit (from the past 48 hour(s)). No results found.  Review of Systems  Constitutional: Negative for fever.  Cardiovascular: Negative for chest pain.  Gastrointestinal: Negative for nausea and vomiting.  Neurological: Negative for headaches.    There were no vitals taken for this visit. Physical Exam  Constitutional: She is oriented to person, place, and time. She appears well-developed and well-nourished.  Cardiovascular: Normal rate and regular rhythm.   No murmur heard. Respiratory: Effort normal. She has wheezes.  GI: Soft. Bowel sounds are normal.  Musculoskeletal:       Uses wc   Neurological: She is alert and oriented to person, place, and time.  Skin: Skin is warm.  Psychiatric: She has a normal mood and affect. Her behavior is normal. Judgment and thought content normal.     Assessment/Plan ESRD; Left arm graft stenosis per shuntogram Now scheduled for angioplasty/stent placement; possible dialysis catheter placement if necessary Pt aware of procedure benefits and risks and agreeable to proceed. Consent signed.  Glenette Bookwalter A 12/13/2011, 3:37 PM

## 2011-12-14 ENCOUNTER — Encounter: Payer: Self-pay | Admitting: Surgery

## 2011-12-14 DIAGNOSIS — I2510 Atherosclerotic heart disease of native coronary artery without angina pectoris: Secondary | ICD-10-CM

## 2011-12-17 ENCOUNTER — Other Ambulatory Visit (INDEPENDENT_AMBULATORY_CARE_PROVIDER_SITE_OTHER): Payer: Medicare Other | Admitting: *Deleted

## 2011-12-17 ENCOUNTER — Encounter: Payer: Self-pay | Admitting: Surgery

## 2011-12-17 ENCOUNTER — Ambulatory Visit (INDEPENDENT_AMBULATORY_CARE_PROVIDER_SITE_OTHER): Payer: Medicare Other | Admitting: Surgery

## 2011-12-17 ENCOUNTER — Encounter (INDEPENDENT_AMBULATORY_CARE_PROVIDER_SITE_OTHER): Payer: Medicare Other | Admitting: *Deleted

## 2011-12-17 VITALS — BP 129/87 | HR 108 | Resp 18 | Ht 70.0 in | Wt 170.0 lb

## 2011-12-17 DIAGNOSIS — I6529 Occlusion and stenosis of unspecified carotid artery: Secondary | ICD-10-CM

## 2011-12-17 DIAGNOSIS — I7092 Chronic total occlusion of artery of the extremities: Secondary | ICD-10-CM

## 2011-12-17 DIAGNOSIS — I739 Peripheral vascular disease, unspecified: Secondary | ICD-10-CM

## 2011-12-17 DIAGNOSIS — Z01810 Encounter for preprocedural cardiovascular examination: Secondary | ICD-10-CM

## 2011-12-17 DIAGNOSIS — Z01818 Encounter for other preprocedural examination: Secondary | ICD-10-CM

## 2011-12-17 NOTE — Progress Notes (Signed)
[facility] [place]   Patient name: [name] MRN: [id number] DOB: August 09, 1971 Sex: female     Chief Complaint  Patient presents with  . PVD    Pt. needs full cardiac eval for surgical clearance    HISTORY OF PRESENT ILLNESS: The patient is back today for followup of her right lower the ulcers which have been present for several months. She had a duplex studies which showed ABI of 0.15 on the right and 0.48 on the left. She underwent arteriogram which revealed right external iliac occlusion as well as bilateral superficial femoral artery occlusion. She is not a candidate for endovascular repair and therefore comes back today to discuss her surgical options. She has a history of left leg DVT  Past Medical History  Diagnosis Date  . DVT (deep venous thrombosis)     DVT HISTORY  . Diabetes mellitus   . Migraine     HISTORY  . Anxiety and depression   . CAD (coronary artery disease)     Last cath 2009:  LAD stent 80% stenosis. Circumflex 60-70% stenosis AV groove, right coronary artery 50-60% stenosis. She did have cutting balloon angioplasty of the LAD lesion into a diagonal. However, there was restenosis of this and it was managed medically.  . Bipolar 1 disorder   . Hyperlipidemia   . Hyperparathyroidism   . PE (pulmonary embolism)     HISTORY  . Dialysis patient   . Anemia   . Hypertension   . Myocardial infarction   . Renal failure     dialysis [place] t,th sat  . Nephrotic syndrome   . Peripheral vascular disease   . MRSA infection   . Anxiety   . Depression   . Gastroparesis     Past Surgical History  Procedure Date  . Back surgery X 4  . Cholecystectomy   . Incise and drain abcess     OF THIGHS FROM INSULIN INJECTIONS  . Heart stents   . Portacath placement 2003  . Eye surgery   . Cataract extraction w/phaco 11/09/2011    Procedure: CATARACT EXTRACTION PHACO AND INTRAOCULAR LENS PLACEMENT (IOC);  Surgeon: Edmon Crape, MD;  Location: [facility]  OR;  Service: Ophthalmology;  Laterality: Right;  . Pars plana vitrectomy 11/09/2011    Procedure: PARS PLANA VITRECTOMY WITH 23 GAUGE;  Surgeon: Edmon Crape, MD;  Location: [facility] OR;  Service: Ophthalmology;  Laterality: Right;  Ahmed Valve; Scleral Reinforcement Graft  . Dg av dialysis  shunt access exist*l* or 12/13/11    left arm    History   Social History  . Marital Status: Divorced    Spouse Name: N/A    Number of Children: N/A  . Years of Education: N/A   Occupational History  . Not on file.   Social History Main Topics  . Smoking status: Current Everyday Smoker -- 2.0 packs/day for 20 years    Types: Cigarettes  . Smokeless tobacco: Never Used   Comment: 1 pack a day  . Alcohol Use: No  . Drug Use: No  . Sexually Active: No   Other Topics Concern  . Not on file   Social History Narrative  . No narrative on file    Family History  Problem Relation Age of Onset  . Transient ischemic attack Mother   . Hepatitis Sister   . Diabetes type II Other     Allergies as of 12/17/2011 - Review Complete 12/17/2011  Allergen Reaction Noted  .  Compazine (prochlorperazine) Swelling 06/25/2011  . Contrast media (iodinated diagnostic agents)  06/25/2011  . Meperidine hcl Swelling   . Metformin and related Other (See Comments) 08/20/2011  . Morphine Swelling   . Nubain (nalbuphine hcl) Swelling 04/15/2011  . Topiramate (topamax) Swelling 06/25/2011  . Toradol (ketorolac tromethamine) Swelling 04/15/2011       REVIEW OF SYSTEMS: No changes from prior visit  PHYSICAL EXAMINATION:   Vital signs are BP 129/87  Pulse 108  Resp 18  Ht 5\' 10"  (1.778 m)  Wt 170 lb (77.111 kg)  BMI 24.39 kg/m2  SpO2 100% General: The patient appears their stated age. HEENT:  No gross abnormalities Pulmonary:  Non labored breathing Abdomen: Soft and non-tender Musculoskeletal: There are no major deformities. Neurologic: No focal weakness or paresthesias are detected, Skin: 2 large  ulcers one on the lateral malleolus dissecting at the tibial tuberosity Psychiatric: The patient has normal affect. Cardiovascular: There is a regular rate and rhythm without significant murmur appreciated.   Diagnostic Studies Vein mapping was performed today which shows her to have an adequate caliber right greater saphenous vein. She also had carotid Doppler studies today minimal disease on the right and 40-59% stenosis on the left. Assessment: Right leg ulceration x2 Plan: I discussed options of revascularization for limb salvage with the patient. First she needs an inflow procedure. We discussed a femoral-femoral bypass versus an aortobifemoral bypass graft. She does appear to have a proximally 25% stenosis at the origin of her left common iliac artery. I feel the most durable operation for her will be in aortobifemoral bypass graft. Because of the location of her ulcers, I do not feel that reestablishing inflow would be adequate to heal her ulcer at the ankle. For that reason she require a simultaneous femoral popliteal bypass graft. I feel is most likely can be an above-knee bypass graft. I plan on using vein from her right leg. Her operation is been scheduled for Friday, May 3. She will continue to remain off plaque she has received cardiac clearance. She did have a nuclear stress  V. Charlena Cross, M.D. [facility] [place] Office: [phone] Pager:  [phone]

## 2011-12-18 ENCOUNTER — Telehealth (HOSPITAL_COMMUNITY): Payer: Self-pay

## 2011-12-18 ENCOUNTER — Encounter: Payer: Self-pay | Admitting: *Deleted

## 2011-12-18 NOTE — Telephone Encounter (Signed)
[name] from [place] called due to pt being clotted again.  Per Dr. Lowella Dandy pt needs to go to surgery.

## 2011-12-19 ENCOUNTER — Other Ambulatory Visit (HOSPITAL_COMMUNITY): Payer: Self-pay | Admitting: Nephrology

## 2011-12-19 ENCOUNTER — Other Ambulatory Visit: Payer: Self-pay | Admitting: Radiology

## 2011-12-19 DIAGNOSIS — N186 End stage renal disease: Secondary | ICD-10-CM

## 2011-12-20 ENCOUNTER — Encounter: Payer: Self-pay | Admitting: Vascular Surgery

## 2011-12-20 ENCOUNTER — Other Ambulatory Visit: Payer: Self-pay | Admitting: Family Medicine

## 2011-12-20 ENCOUNTER — Other Ambulatory Visit (HOSPITAL_COMMUNITY): Payer: Self-pay | Admitting: Nephrology

## 2011-12-20 ENCOUNTER — Ambulatory Visit (HOSPITAL_COMMUNITY)
Admission: RE | Admit: 2011-12-20 | Discharge: 2011-12-20 | Disposition: A | Discharge status: Home / Self Care | Payer: Medicare Other | Source: Ambulatory Visit | Attending: Nephrology | Admitting: Nephrology

## 2011-12-20 ENCOUNTER — Other Ambulatory Visit (HOSPITAL_COMMUNITY): Payer: Self-pay | Admitting: Family Medicine

## 2011-12-20 DIAGNOSIS — Z452 Encounter for adjustment and management of vascular access device: Secondary | ICD-10-CM | POA: Insufficient documentation

## 2011-12-20 DIAGNOSIS — R609 Edema, unspecified: Secondary | ICD-10-CM | POA: Insufficient documentation

## 2011-12-20 DIAGNOSIS — R05 Cough: Secondary | ICD-10-CM | POA: Insufficient documentation

## 2011-12-20 DIAGNOSIS — R51 Headache: Secondary | ICD-10-CM | POA: Insufficient documentation

## 2011-12-20 DIAGNOSIS — N186 End stage renal disease: Secondary | ICD-10-CM

## 2011-12-20 DIAGNOSIS — E785 Hyperlipidemia, unspecified: Secondary | ICD-10-CM | POA: Insufficient documentation

## 2011-12-20 DIAGNOSIS — R109 Unspecified abdominal pain: Secondary | ICD-10-CM | POA: Insufficient documentation

## 2011-12-20 DIAGNOSIS — R112 Nausea with vomiting, unspecified: Secondary | ICD-10-CM | POA: Insufficient documentation

## 2011-12-20 DIAGNOSIS — Z86718 Personal history of other venous thrombosis and embolism: Secondary | ICD-10-CM | POA: Insufficient documentation

## 2011-12-20 DIAGNOSIS — R059 Cough, unspecified: Secondary | ICD-10-CM | POA: Insufficient documentation

## 2011-12-20 DIAGNOSIS — I120 Hypertensive chronic kidney disease with stage 5 chronic kidney disease or end stage renal disease: Secondary | ICD-10-CM | POA: Insufficient documentation

## 2011-12-20 DIAGNOSIS — E119 Type 2 diabetes mellitus without complications: Secondary | ICD-10-CM | POA: Insufficient documentation

## 2011-12-20 DIAGNOSIS — I252 Old myocardial infarction: Secondary | ICD-10-CM | POA: Insufficient documentation

## 2011-12-20 DIAGNOSIS — I2510 Atherosclerotic heart disease of native coronary artery without angina pectoris: Secondary | ICD-10-CM | POA: Insufficient documentation

## 2011-12-20 LAB — BASIC METABOLIC PANEL
BUN: 112 mg/dL — ABNORMAL HIGH (ref 6–23)
Creatinine, Ser: 9.51 mg/dL — ABNORMAL HIGH (ref 0.50–1.10)
GFR calc Af Amer: 5 mL/min — ABNORMAL LOW (ref >90)
GFR calc non Af Amer: 4 mL/min — ABNORMAL LOW (ref >90)

## 2011-12-20 LAB — CBC
MCHC: 34 g/dL (ref 30.0–36.0)
RDW: 16 % — ABNORMAL HIGH (ref 11.5–15.5)

## 2011-12-20 LAB — PROTIME-INR
INR: 0.96 (ref 0.00–1.49)
Prothrombin Time: 13 seconds (ref 11.6–15.2)

## 2011-12-20 MED ORDER — PROMETHAZINE HCL 25 MG/ML IJ SOLN
INTRAMUSCULAR | Status: AC
  Filled 2011-12-20: qty 1

## 2011-12-20 MED ORDER — CEFAZOLIN SODIUM 1-5 GM-% IV SOLN
1.0000 g | Freq: Once | INTRAVENOUS | Status: AC
  Administered 2011-12-20: 1 g via INTRAVENOUS

## 2011-12-20 MED ORDER — HEPARIN SODIUM (PORCINE) 1000 UNIT/ML IJ SOLN
INTRAMUSCULAR | Status: AC
  Administered 2011-12-20: 2000 [IU]
  Filled 2011-12-20: qty 1

## 2011-12-20 MED ORDER — MIDAZOLAM HCL 5 MG/5ML IJ SOLN
INTRAMUSCULAR | Status: AC | PRN
  Administered 2011-12-20 (×2): 1 mg via INTRAVENOUS

## 2011-12-20 MED ORDER — MIDAZOLAM HCL 2 MG/2ML IJ SOLN
INTRAMUSCULAR | Status: AC
  Filled 2011-12-20: qty 4

## 2011-12-20 MED ORDER — HEPARIN SODIUM (PORCINE) 1000 UNIT/ML IJ SOLN
INTRAMUSCULAR | Status: AC | PRN
  Administered 2011-12-20: 2100 [IU]

## 2011-12-20 MED ORDER — FENTANYL CITRATE 0.05 MG/ML IJ SOLN
INTRAMUSCULAR | Status: AC | PRN
  Administered 2011-12-20: 50 ug via INTRAVENOUS
  Administered 2011-12-20: 100 ug via INTRAVENOUS

## 2011-12-20 MED ORDER — FENTANYL CITRATE 0.05 MG/ML IJ SOLN
INTRAMUSCULAR | Status: AC
  Filled 2011-12-20: qty 6

## 2011-12-20 MED ORDER — SODIUM CHLORIDE 0.9 % IV SOLN
Freq: Once | INTRAVENOUS | Status: AC
  Administered 2011-12-20: 10:00:00 via INTRAVENOUS

## 2011-12-20 MED ORDER — MIDAZOLAM HCL 2 MG/2ML IJ SOLN
INTRAMUSCULAR | Status: AC
  Filled 2011-12-20: qty 2

## 2011-12-20 MED ORDER — CEFAZOLIN SODIUM 1-5 GM-% IV SOLN
INTRAVENOUS | Status: AC
  Filled 2011-12-20: qty 50

## 2011-12-20 NOTE — H&P (Signed)
[name] is an 41 y.o. female.   Chief Complaint: clotted dialysis access HPI: Patient with ESRD and recurrent thrombotic/stenotic difficulties with left arm AVGG presents today for placement of hemodialysis catheter. Pt is also scheduled for port a cath removal prior to dialysis cath placement.  Past Medical History  Diagnosis Date  . DVT (deep venous thrombosis)     DVT HISTORY  . Diabetes mellitus   . Migraine     HISTORY  . Anxiety and depression   . CAD (coronary artery disease)     Last cath 2009:  LAD stent 80% stenosis. Circumflex 60-70% stenosis AV groove, right coronary artery 50-60% stenosis. She did have cutting balloon angioplasty of the LAD lesion into a diagonal. However, there was restenosis of this and it was managed medically.  . Bipolar 1 disorder   . Hyperlipidemia   . Hyperparathyroidism   . PE (pulmonary embolism)     HISTORY  . Dialysis patient   . Anemia   . Hypertension   . Myocardial infarction   . Renal failure     dialysis [place] t,th sat  . Nephrotic syndrome   . Peripheral vascular disease   . MRSA infection   . Anxiety   . Depression   . Gastroparesis     Past Surgical History  Procedure Date  . Back surgery X 4  . Cholecystectomy   . Incise and drain abcess     OF THIGHS FROM INSULIN INJECTIONS  . Heart stents   . Portacath placement 2003  . Eye surgery   . Cataract extraction w/phaco 11/09/2011    Procedure: CATARACT EXTRACTION PHACO AND INTRAOCULAR LENS PLACEMENT (IOC);  Surgeon: Edmon Crape, MD;  Location: [facility] OR;  Service: Ophthalmology;  Laterality: Right;  . Pars plana vitrectomy 11/09/2011    Procedure: PARS PLANA VITRECTOMY WITH 23 GAUGE;  Surgeon: Edmon Crape, MD;  Location: [facility] OR;  Service: Ophthalmology;  Laterality: Right;  Ahmed Valve; Scleral Reinforcement Graft  . Dg av dialysis  shunt access exist*l* or 12/13/11    left arm    Family History  Problem Relation Age of Onset  . Transient ischemic attack Mother   .  Hepatitis Sister   . Diabetes type II Other    Social History:  reports that she has been smoking Cigarettes.  She has a 40 pack-year smoking history. She has never used smokeless tobacco. She reports that she does not drink alcohol or use illicit drugs.  Allergies:  Allergies  Allergen Reactions  . Compazine (Prochlorperazine) Swelling    Per echart records  . Contrast Media (Iodinated Diagnostic Agents)     Per echart records  . Meperidine Hcl Swelling  . Metformin And Related Other (See Comments)    'just not myself, was told not to take metformin'  . Morphine Swelling  . Nubain (Nalbuphine Hcl) Swelling  . Topiramate (Topamax) Swelling    Tongue swelling - per echart records  . Toradol (Ketorolac Tromethamine) Swelling     (Not in a hospital admission)  Results for orders placed during the hospital encounter of 12/20/11  CBC      Component Value Range   WBC 13.2 (*) 4.0 - 10.5 (K/uL)   RBC 3.59 (*) 3.87 - 5.11 (MIL/uL)   Hemoglobin 11.6 (*) 12.0 - 15.0 (g/dL)   HCT 13.0 (*) 86.5 - 46.0 (%)   MCV 95.0  78.0 - 100.0 (fL)   MCH 32.3  26.0 - 34.0 (pg)   MCHC  34.0  30.0 - 36.0 (g/dL)   RDW 16.1 (*) 09.6 - 15.5 (%)   Platelets 433 (*) 150 - 400 (K/uL)    Review of Systems  Constitutional: Negative for fever and chills.  Respiratory: Positive for cough. Negative for shortness of breath.   Cardiovascular: Positive for claudication. Negative for chest pain.  Gastrointestinal: Positive for nausea, vomiting and abdominal pain.  Neurological: Positive for headaches.  Endo/Heme/Allergies: Does not bruise/bleed easily.   Vitals:   BP  174/94  HR  95  R 14   TEMP  97.3  O2 SATS  95% RA Physical Exam  Constitutional: She is oriented to person, place, and time. She appears well-developed and well-nourished.  Cardiovascular: Normal rate and regular rhythm.        Left arm AVGG with no thrill/bruit  Respiratory: Effort normal.       Decreased BS bases, scatt insp wheezes  GI:  Soft. Bowel sounds are normal. There is no tenderness.       obese  Musculoskeletal: Normal range of motion. She exhibits edema.  Neurological: She is alert and oriented to person, place, and time.     Assessment/Plan Patient with ESRD and recurrent thrombosed left arm AVGG; plan is for port a cath removal and hemodialysis catheter placement. Details/risks of above d/w pt with her understanding and consent.  [name],D KEVIN 12/20/2011, 8:36 AM

## 2011-12-20 NOTE — Discharge Instructions (Signed)
Arteriovenous (AV) Access for Hemodialysis An arteriovenous (AV) access is a surgically created or placed tube that allows for repeated access to the blood in your body. This access is required for hemodialysis, a type of dialysis. Dialysis is a treatment process that filters and cleans the blood in order to eliminate toxic wastes from the body when the kidneys fail to do this on their own. There are several different access methods used for hemodialysis. ACCESS METHODS  Double lumen catheter. A flexible tube with 2 channels may be used on a temporary or long-term basis. A long-term use catheter often has a cuff that holds it in place. The catheter is surgically placed and tunneled under the skin. This catheter is often placed when dialysis is needed in an emergency, such as when the kidneys suddenly stop working. It may also be needed when a permanent AV access fails or has not yet been placed. A catheter is usually placed into one of the following:   Large vein under your collarbone (subclavian vein).   Large vein in your neck (jugular vein).   Temporarily, in the large vein in your groin (femoral vein).   AV graft. A man-made (synthetic) material may be used to connect an artery and vein in the arm or thigh. This graft takes around 2 weeks to develop (mature) and is often placed a few weeks prior to use. A graft can generally last from 1 to 2 years.   AV fistula. A minor surgical procedure may be done to connect an artery and vein, creating a fistula. This causes arterial blood to flow directly into a vein. The vein gets larger, allowing easier access for dialysis. The fistula takes around 12 weeks to mature and must be placed several months before dialysis is anticipated. A fistula provides the best access for hemodialysis and can last for several years.  It is critical that veins in patients at high risk to develop kidney failure are preserved. This may include avoiding blood pressure checks and  intravenous (IV) or lab draws from the arm. This maximizes the chance for creating a functioning AV access when needed. Although a fistula is the most desirable access to use for hemodialysis, it may not be possible. If the veins are not large enough or there is no time to wait for a fistula to mature, a graft or catheter may be used. RISKS AND COMPLICATIONS   Double lumen catheter. A catheter may develop serious infections. It may also develop a clot (thrombosis) and fail. A catheter can cause clots in the vein in which it is placed. A subclavian vein catheter is the most likely to cause thrombosis. When the subclavian vein clots, it makes it very difficult for the patient to sustain an AV graft or fistula. When possible, catheters should be avoided and a more permanent AV access should be placed.   AV graft. A graft may swell after surgery, but this should decrease as it heals. A graft may stop working properly due to thrombosis or the diameter of the tube getting smaller. The tube can eventually become blocked (stenosis). If a partial blockage is found relatively early, it can be treated. Left untreated, the stenosis will progress until the vessel is completely blocked. Infection may also occur.   AV fistula. Infection and thrombosis are the biggest risks with a fistula. However, the rates for infection and stenosis are lower with fistulas than the other 2 methods. Frequent thrombosis may require creating a backup fistula at another site.   This will allow for dialysis when one access is blocked.  HOME CARE INSTRUCTIONS   Keep the site of the cut (incision) clean and dry while it heals. This helps prevent infection.   If you have a catheter, do not shower while the incision is healing. After it is healed, ask your caregiver for recommendations on showering. The bandage (dressing) will be changed at the dialysis center. Do not remove this dressing. However, if it becomes wet or loose, a sterile gauze  dressing may be placed over the site and secured by placing adhesive tape on the edges.   If you have a graft or fistula, clean the site daily until it heals completely. Stitches will be removed, usually after about 10 to 14 days. Once the access is being used for dialysis, a small dressing will be placed over the needle sites after the treatment is done. Keep this dressing on for at least 12 hours. Keep your arm or thigh clean and dry during this time.   A small amount of bleeding is normal, especially if the access is new. If you have a large amount of bleeding and cannot stop it, this is not normal. Call your caregiver right away. You will be taught how to hold your graft or fistula sites to stop the bleeding.  If you have a graft or fistula:  A "bruit" is a noise that is heard with a stethoscope and a "thrill" is a vibration felt over the graft or fistula. The presence of the bruit and thrill indicate that the access is working. You will be taught to feel for the thrill each day. If this is not felt, the access may be clotted. Call your caregiver.   You may use the arm freely after the site heals. Keep the following in mind:   Avoid pressure on the arm.   Avoid lifting heavy objects with the arm.   Avoid sleeping on the arm with the graft or fistula.   Avoid wearing tight-sleeved shirts or jewelry around the graft or fistula.   Do not allow blood pressure monitoring or needle punctures on the side where the graft or fistula is located.   With permission from your caregiver, you may do exercises to help with blood flow through a fistula. These exercises involve squeezing a rubber ball or other soft objects as instructed.  SEEK MEDICAL CARE IF:   Chills develop.   You have an oral temperature above 102 F (38.9 C).   Swelling around the graft or fistula gets worse.   New pain develops.   Unusual bleeding develops.   Pus or other fluid (drainage) is seen at the AV access site.    Skin redness or red streaking is seen on the skin around, above, or below the AV access.  SEEK IMMEDIATE MEDICAL CARE IF:   Pain, numbness, or an unusual pale skin color develops in the hand on the side of your fistula.   Dizziness or weakness develops that you have not had before.   Shortness of breath develops.   Chest pain develops.   The AV access has bleeding that cannot be easily controlled.  Wear a medical alert bracelet to let caregivers know you are a dialysis patient, so they can care for your veins appropriately. Document Released: 11/03/2002 Document Revised: 08/02/2011 Document Reviewed: 01/10/2010 ExitCare Patient Information 2012 [employer].  Moderate Sedation, Adult Moderate sedation is given to help you relax or even sleep through a procedure. You may remain sleepy,   be clumsy, or have poor balance for several hours following this procedure. Arrange for a responsible adult, family member, or friend to take you home. A responsible adult should stay with you for at least 24 hours or until the medicines have worn off.  Do not participate in any activities where you could become injured for the next 24 hours, or until you feel normal again. Do not:   Drive.   Swim.   Ride a bicycle.   Operate heavy machinery.   Cook.   Use power tools.   Climb ladders.   Work at heights.   Do not make important decisions or sign legal documents until you are improved.   Vomiting may occur if you eat too soon. When you can drink without vomiting, try water, juice, or soup. Try solid foods if you feel little or no nausea.   Only take over-the-counter or prescription medications for pain, discomfort, or fever as directed by your caregiver.If pain medications have been prescribed for you, ask your caregiver how soon it is safe to take them.   Make sure you and your family fully understands everything about the medication given to you. Make sure you understand what side  effects may occur.   You should not drink alcohol, take sleeping pills, or medications that cause drowsiness for at least 24 hours.   If you smoke, do not smoke alone.   If you are feeling better, you may resume normal activities 24 hours after receiving sedation.   Keep all appointments as scheduled. Follow all instructions.   Ask questions if you do not understand.  SEEK MEDICAL CARE IF:   Your skin is pale or bluish in color.   You continue to feel sick to your stomach (nauseous) or throw up (vomit).   Your pain is getting worse and not helped by medication.   You have bleeding or swelling.   You are still sleepy or feeling clumsy after 24 hours.  SEEK IMMEDIATE MEDICAL CARE IF:   You develop a rash.   You have difficulty breathing.   You develop any type of allergic problem.   You have a fever.  Document Released: 05/08/2001 Document Revised: 08/02/2011 Document Reviewed: 09/29/2007 ExitCare Patient Information 2012 [employer]. 

## 2011-12-20 NOTE — Procedures (Signed)
Successful placement of left internal jugular approach dialysis catheter with tip at superior aspect of the right atrium. The catheter is ready for immediate use. Successful removal of right anterior chest wall port-a-cath. No immediate post procedural complications.

## 2011-12-21 ENCOUNTER — Other Ambulatory Visit: Payer: Self-pay

## 2011-12-21 ENCOUNTER — Ambulatory Visit (INDEPENDENT_AMBULATORY_CARE_PROVIDER_SITE_OTHER): Payer: Medicare Other | Admitting: *Deleted

## 2011-12-21 ENCOUNTER — Encounter: Payer: Self-pay | Admitting: Surgery

## 2011-12-21 ENCOUNTER — Ambulatory Visit: Payer: Medicare Other | Admitting: Vascular Surgery

## 2011-12-21 DIAGNOSIS — N186 End stage renal disease: Secondary | ICD-10-CM

## 2011-12-21 DIAGNOSIS — Z01810 Encounter for preprocedural cardiovascular examination: Secondary | ICD-10-CM

## 2011-12-24 ENCOUNTER — Encounter: Payer: Self-pay | Admitting: Surgery

## 2011-12-24 ENCOUNTER — Encounter (HOSPITAL_COMMUNITY): Payer: Self-pay | Admitting: Pharmacy Technician

## 2011-12-24 ENCOUNTER — Ambulatory Visit (HOSPITAL_COMMUNITY)
Admission: RE | Admit: 2011-12-24 | Discharge: 2011-12-24 | Disposition: A | Discharge status: Home / Self Care | Payer: Medicare Other | Source: Ambulatory Visit | Attending: Anesthesiology | Admitting: Anesthesiology

## 2011-12-24 ENCOUNTER — Encounter (HOSPITAL_COMMUNITY)
Admission: RE | Admit: 2011-12-24 | Discharge: 2011-12-24 | Disposition: A | Discharge status: Home / Self Care | Payer: Medicare Other | Source: Ambulatory Visit | Attending: Surgery | Admitting: Surgery

## 2011-12-24 ENCOUNTER — Ambulatory Visit: Payer: Medicare Other | Admitting: Surgery

## 2011-12-24 ENCOUNTER — Encounter (HOSPITAL_COMMUNITY): Payer: Self-pay

## 2011-12-24 ENCOUNTER — Ambulatory Visit (INDEPENDENT_AMBULATORY_CARE_PROVIDER_SITE_OTHER): Payer: Medicare Other | Admitting: Surgery

## 2011-12-24 VITALS — BP 105/60 | HR 106 | Resp 18 | Ht 70.0 in | Wt 170.0 lb

## 2011-12-24 DIAGNOSIS — I252 Old myocardial infarction: Secondary | ICD-10-CM | POA: Insufficient documentation

## 2011-12-24 DIAGNOSIS — I517 Cardiomegaly: Secondary | ICD-10-CM | POA: Insufficient documentation

## 2011-12-24 DIAGNOSIS — T82898A Other specified complication of vascular prosthetic devices, implants and grafts, initial encounter: Secondary | ICD-10-CM

## 2011-12-24 DIAGNOSIS — Z01812 Encounter for preprocedural laboratory examination: Secondary | ICD-10-CM | POA: Insufficient documentation

## 2011-12-24 DIAGNOSIS — Z01818 Encounter for other preprocedural examination: Secondary | ICD-10-CM | POA: Insufficient documentation

## 2011-12-24 DIAGNOSIS — E119 Type 2 diabetes mellitus without complications: Secondary | ICD-10-CM | POA: Insufficient documentation

## 2011-12-24 DIAGNOSIS — J90 Pleural effusion, not elsewhere classified: Secondary | ICD-10-CM | POA: Insufficient documentation

## 2011-12-24 DIAGNOSIS — I10 Essential (primary) hypertension: Secondary | ICD-10-CM | POA: Insufficient documentation

## 2011-12-24 HISTORY — DX: Pneumonia, unspecified organism: J18.9

## 2011-12-24 HISTORY — DX: Other specified postprocedural states: Z98.890

## 2011-12-24 HISTORY — DX: Other complications of anesthesia, initial encounter: T88.59XA

## 2011-12-24 HISTORY — DX: Shortness of breath: R06.02

## 2011-12-24 HISTORY — DX: Acute upper respiratory infection, unspecified: J06.9

## 2011-12-24 HISTORY — DX: Gastro-esophageal reflux disease without esophagitis: K21.9

## 2011-12-24 HISTORY — DX: Adverse effect of unspecified anesthetic, initial encounter: T41.45XA

## 2011-12-24 HISTORY — DX: Other specified postprocedural states: R11.2

## 2011-12-24 LAB — APTT: aPTT: 34 seconds (ref 24–37)

## 2011-12-24 LAB — BLOOD GAS, ARTERIAL
Bicarbonate: 18.3 mEq/L — ABNORMAL LOW (ref 20.0–24.0)
FIO2: 0.21 %
O2 Saturation: 93.3 %
Patient temperature: 98.6
TCO2: 19.3 mmol/L (ref 0–100)

## 2011-12-24 LAB — COMPREHENSIVE METABOLIC PANEL
BUN: 46 mg/dL — ABNORMAL HIGH (ref 6–23)
Calcium: 9.8 mg/dL (ref 8.4–10.5)
Creatinine, Ser: 5.82 mg/dL — ABNORMAL HIGH (ref 0.50–1.10)
GFR calc Af Amer: 9 mL/min — ABNORMAL LOW (ref >90)
Glucose, Bld: 269 mg/dL — ABNORMAL HIGH (ref 70–99)
Total Protein: 6.6 g/dL (ref 6.0–8.3)

## 2011-12-24 LAB — SURGICAL PCR SCREEN
MRSA, PCR: NEGATIVE
Staphylococcus aureus: NEGATIVE

## 2011-12-24 LAB — CBC
HCT: 32.6 % — ABNORMAL LOW (ref 36.0–46.0)
Hemoglobin: 10.9 g/dL — ABNORMAL LOW (ref 12.0–15.0)
MCH: 32 pg (ref 26.0–34.0)
MCHC: 33.4 g/dL (ref 30.0–36.0)

## 2011-12-24 NOTE — Progress Notes (Signed)
[facility] [place]   Patient name: [name] MRN: [id number] DOB: 15-Jul-1971 Sex: female     Chief Complaint  Patient presents with  . Follow-up    evaluate for new AVF , UE with vein mapping 12-24-2011    HISTORY OF PRESENT ILLNESS: The patient is sent over today for evaluation of dialysis access. She currently is receiving dialysis through a catheter. She is scheduled to undergo aortobifemoral bypass graft and right leg femoral-popliteal bypass graft for limb salvage 222 ulcers this coming Friday.  Past Medical History  Diagnosis Date  . DVT (deep venous thrombosis)     DVT HISTORY  . Diabetes mellitus   . Migraine     HISTORY  . Anxiety and depression   . CAD (coronary artery disease)     Last cath 2009:  LAD stent 80% stenosis. Circumflex 60-70% stenosis AV groove, right coronary artery 50-60% stenosis. She did have cutting balloon angioplasty of the LAD lesion into a diagonal. However, there was restenosis of this and it was managed medically.  . Bipolar 1 disorder   . Hyperlipidemia   . Hyperparathyroidism   . PE (pulmonary embolism)     HISTORY  . Dialysis patient   . Anemia   . Hypertension   . Myocardial infarction   . Renal failure     dialysis [place] t,th sat  . Nephrotic syndrome   . Peripheral vascular disease   . MRSA infection   . Anxiety   . Depression   . Gastroparesis     Past Surgical History  Procedure Date  . Back surgery X 4  . Cholecystectomy   . Incise and drain abcess     OF THIGHS FROM INSULIN INJECTIONS  . Heart stents   . Portacath placement 2003  . Eye surgery   . Cataract extraction w/phaco 11/09/2011    Procedure: CATARACT EXTRACTION PHACO AND INTRAOCULAR LENS PLACEMENT (IOC);  Surgeon: Edmon Crape, MD;  Location: [facility] OR;  Service: Ophthalmology;  Laterality: Right;  . Pars plana vitrectomy 11/09/2011    Procedure: PARS PLANA VITRECTOMY WITH 23 GAUGE;  Surgeon: Edmon Crape, MD;  Location: [facility] OR;   Service: Ophthalmology;  Laterality: Right;  Ahmed Valve; Scleral Reinforcement Graft  . Dg av dialysis  shunt access exist*l* or 12/13/11    left arm    History   Social History  . Marital Status: Divorced    Spouse Name: N/A    Number of Children: N/A  . Years of Education: N/A   Occupational History  . Not on file.   Social History Main Topics  . Smoking status: Current Everyday Smoker -- 1.0 packs/day for 20 years    Types: Cigarettes  . Smokeless tobacco: Never Used   Comment: 1 pack a day  . Alcohol Use: No  . Drug Use: No  . Sexually Active: No   Other Topics Concern  . Not on file   Social History Narrative  . No narrative on file    Family History  Problem Relation Age of Onset  . Transient ischemic attack Mother   . Hepatitis Sister   . Diabetes type II Other     Allergies as of 12/24/2011 - Review Complete 12/24/2011  Allergen Reaction Noted  . Compazine (prochlorperazine) Swelling 06/25/2011  . Contrast media (iodinated diagnostic agents)  06/25/2011  . Meperidine hcl Swelling   . Metformin and related Other (See Comments) 08/20/2011  . Morphine Swelling   . Nubain (  nalbuphine hcl) Swelling 04/15/2011  . Topiramate (topamax) Swelling 06/25/2011  . Toradol (ketorolac tromethamine) Swelling 04/15/2011       REVIEW OF SYSTEMS: Changes from prior visit  PHYSICAL EXAMINATION:   Vital signs are BP 105/60  Pulse 106  Resp 18  Ht 5\' 10"  (1.778 m)  Wt 170 lb (77.111 kg)  BMI 24.39 kg/m2 General: The patient appears their stated age. HEENT:  No gross abnormalities Pulmonary:  Non labored breathing Musculoskeletal: There are no major deformities. Neurologic: No focal weakness or paresthesias are detected, Skin: There are no ulcer or rashes noted. Psychiatric: The patient has normal affect.    Diagnostic Studies Vein mapping was ordered and reviewed. I think she has an adequate basilic vein on the right  Assessment: End-stage renal  disease Plan: I'm not going to address her renal access issues until she has successfully completed a right leg revascularization for limb salvage which is scheduled for this Friday. Once he has recovered I will consider proceeding with a right basilic vein transposition  V. Charlena Cross, M.D. [facility] [place] Office: [phone] Pager:  [phone]

## 2011-12-24 NOTE — Progress Notes (Signed)
Call to A. Zelenak,PAC, to review CXR.  Will repeat CXR, based on no CXR post dialysis catheter placement

## 2011-12-24 NOTE — Pre-Procedure Instructions (Signed)
20 LYRICAL SOWLE  12/24/2011   Your procedure is scheduled on:Wednesday, May 3rd.  Report to [facility] at 6:30AM.  Call this number if you have problems the morning of surgery: [phone]   Remember:   Do not eat food:After Midnight.  May have clear liquids: up to 4 Hours before arrival.  (2:30am)  Clear liquids include soda, tea, black coffee, apple or grape juice, broth.    Take these medicines the morning of surgery with A SIP OF WATER: Amodipine, Clonidine, Metoclopraide, Omeprazole, Abilify.  Albuterol Inhaler and bring with you to the hospital.  May take Xanax, Perocet and Phenergan if needed.   Do not wear jewelry, make-up or nail polish.  Do not wear lotions, powders, or perfumes. You may wear deodorant.  Do not shave 48 hours prior to surgery.  Do not bring valuables to the hospital.  Contacts, dentures or bridgework may not be worn into surgery.  Leave suitcase in the car. After surgery it may be brought to your room.  For patients admitted to the hospital, checkout time is 11:00 AM the day of discharge.   Patients discharged the day of surgery will not be allowed to drive home.  Name and phone number of your driver: --  Special Instructions: CHG Shower Use Special Wash: 1/2 bottle night before surgery and 1/2 bottle morning of surgery.   Please read over the following fact sheets that you were given: Pain Booklet, Coughing and Deep Breathing, Blood Transfusion Information, MRSA Information and Surgical Site Infection Prevention

## 2011-12-24 NOTE — Pre-Procedure Instructions (Signed)
20 EARA BURRUEL  12/24/2011   Your procedure is scheduled on: Dec 28, 2011  Report to [facility] at 5:30 AM.  Call this number if you have problems the morning of surgery: [phone]   Remember:   Do not eat food:After Midnight.  May have clear liquids: up to 4 Hours before arrival.  NOTHING to drink after 1:30a.m.   Clear liquids include soda, tea, black coffee, apple or grape juice, broth.  Take these medicines the morning of surgery with A SIP OF WATER: amlodipine, clonidine, prilosec, abilify, phenergan & metoclopramide, may take pain med. As needed, xanax, paxil    Do not wear jewelry, make-up or nail polish.  Do not wear lotions, powders, or perfumes. You may wear deodorant.  Do not shave 48 hours prior to surgery.  Do not bring valuables to the hospital.  Contacts, dentures or bridgework may not be worn into surgery.  Leave suitcase in the car. After surgery it may be brought to your room.  For patients admitted to the hospital, checkout time is 11:00 AM the day of discharge.   Patients discharged the day of surgery will not be allowed to drive home.  Name and phone number of your driver: WITH sister & Mom  Special Instructions: CHG Shower Use Special Wash: 1/2 bottle night before surgery and 1/2 bottle morning of surgery.   Please read over the following fact sheets that you were given: Pain Booklet, Coughing and Deep Breathing, Blood Transfusion Information, MRSA Information and Surgical Site Infection Prevention

## 2011-12-24 NOTE — Progress Notes (Signed)
U/A order d/c'd due to pt. Not able to void.

## 2011-12-25 NOTE — Consult Note (Signed)
Anesthesia Chart Review:  Patient is a 41 year old female scheduled for AFBG and right FPBG for PVD on 12/28/11.  History includes ESRD on HD TTS, DM2, smoking, HLD, migraines, DVT/PE, CAD/MI s/p LAD/DIAG stents, anxiety and depression, Bipolar 1 disorder, gastroparesis, post-operative N/V, GERD, HTN.  Her Cardiologist is Dr. Antoine Poche.  She was last seen on 12/04/11.  Nuclear stress test was recommended prior to her vascular procedure.  Stress test at [facility] on 12/14/11 showed EF 63% without sign of scar or ischemia.   Her last cath was on 12/24/07: LAD stent 80% stenosis. Circumflex 60-70% stenosis AV groove, right coronary artery 50-60% stenosis. She did have cutting balloon angioplasty of the LAD lesion into a diagonal. However, there was restenosis of this and it was managed medically.   EKG from 10/31/11 showed NSR, first degree AVB, LAD, anteroseptal infarct (age undetermined).  Labs noted.  She will get an ISTAT 4 on arrival.  Glucose is 269.  T&S done already.  No UA was done because she is unable to void due to ESRD.  CXR from 12/24/11 showed bronchitic changes with chronic small right pleural effusion. Borderline enlargement of cardiac silhouette.  Patient was afebrile with 97% sats at PAT on 12/24/11.  No acute respiratory symptoms were documented or reported to me by her PAT nurse.  If no new worrisome cardiopulmonary symptoms and follow-up labs are reasonable, then anticipate she can proceed as planned.  Shonna Chock, PA-C

## 2011-12-26 IMAGING — CR DG ABDOMEN ACUTE W/ 1V CHEST
3 series · 3 of 3 positions shown · non-contrast
Comparison: Acute abdomen series 11/14/2010, 10/07/2010, and
06/09/2010.

CLINICAL DATA: Abdominal pain.  Nausea and vomiting.  End-stage
renal disease on hemodialysis.

ACUTE ABDOMEN SERIES (ABDOMEN 2 VIEW & CHEST 1 VIEW) 12/28/2010:

[view not recorded (1 of 3)]
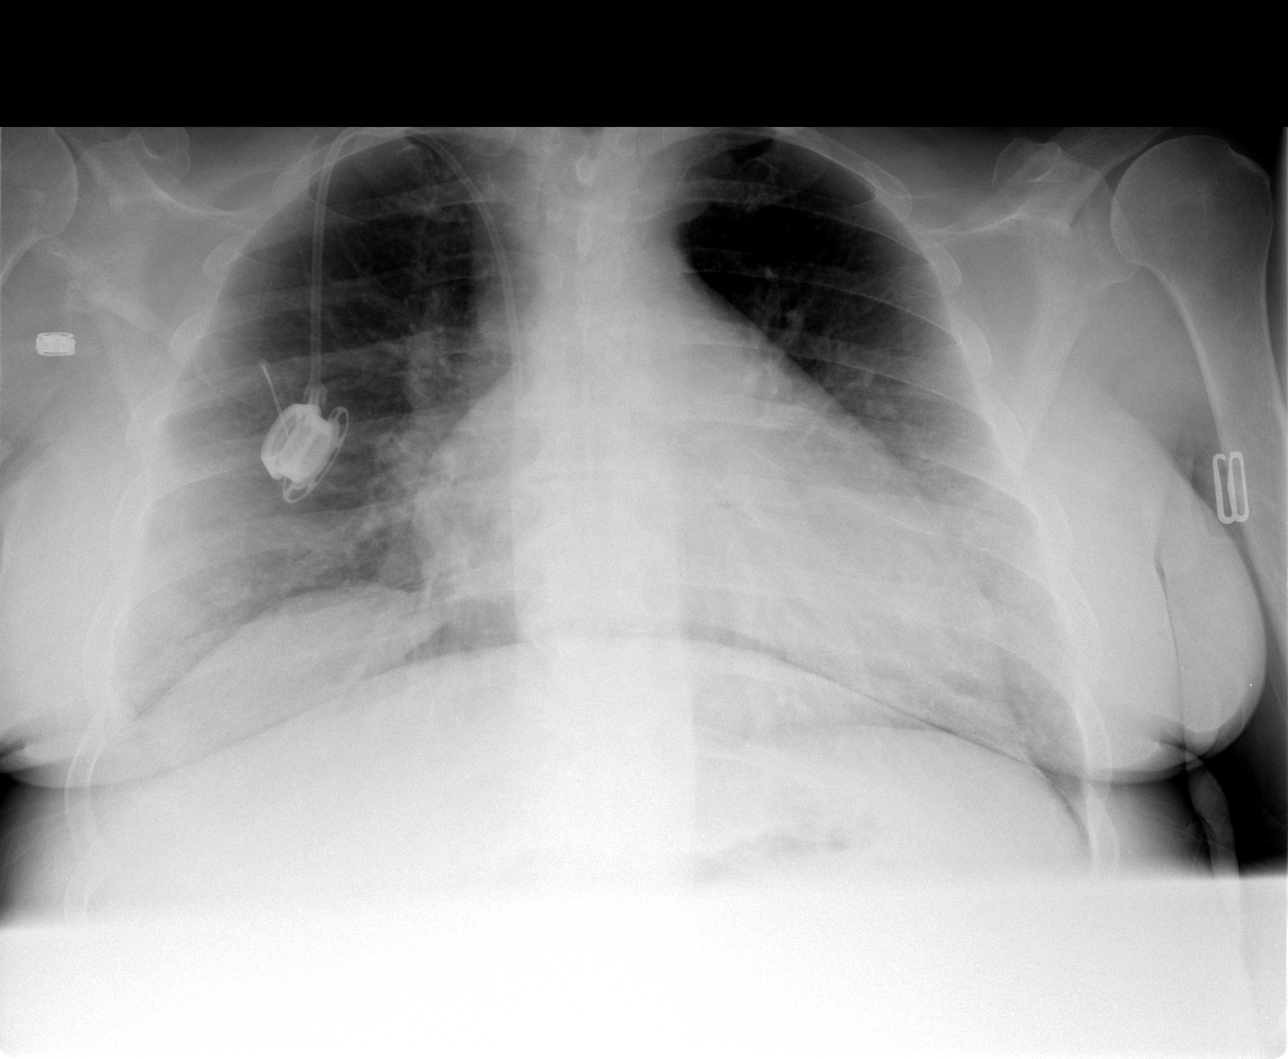

[view not recorded (2 of 3)]
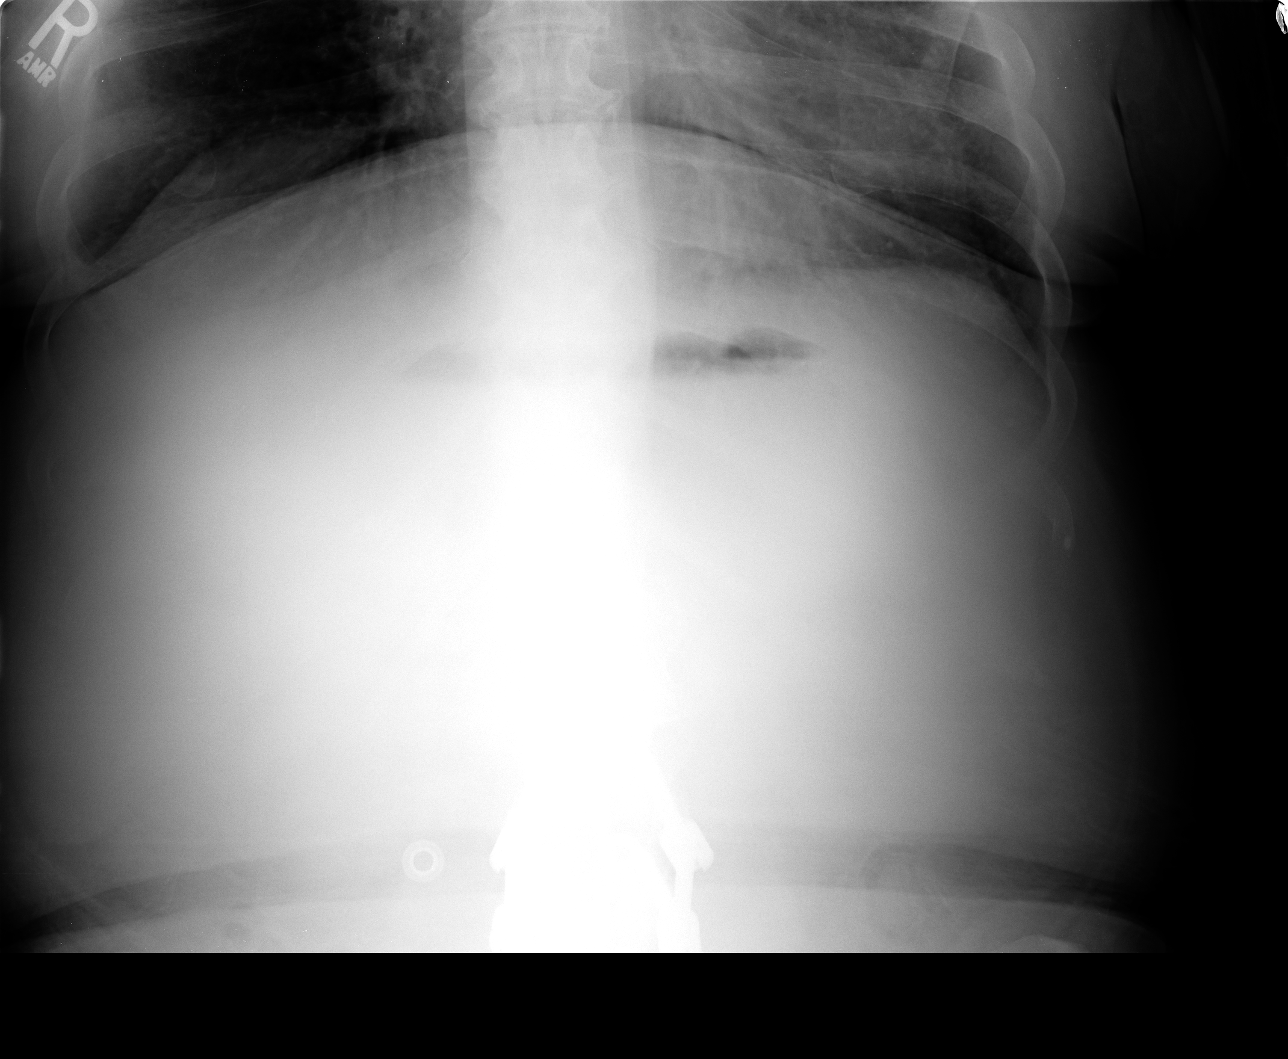

[view not recorded (3 of 3)]
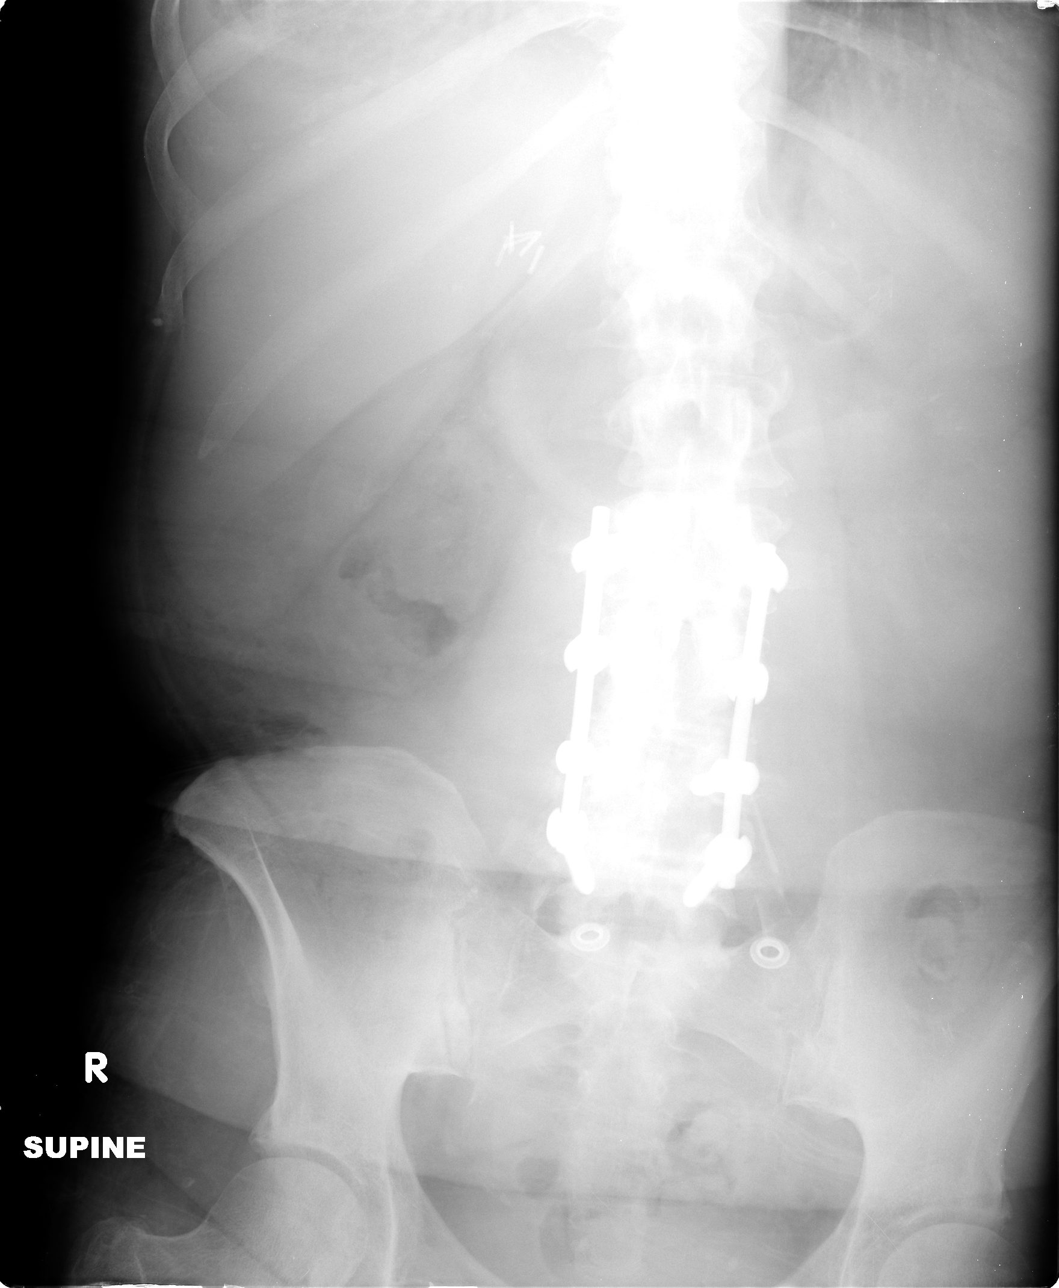

[3 of 3 positions shown; findings below may reference images not displayed]

FINDINGS: Bowel gas pattern unremarkable without evidence of
obstruction or significant ileus.  No evidence of free air or
significant air fluid levels on the erect image.  Surgical clips
the right upper quadrant from prior cholecystectomy.  Prior L3-S1
fusion with hardware.

Cardiac silhouette enlarged but stable.  Pulmonary vascularity
normal without evidence of pulmonary edema.  No pleural effusions.
Lungs clear.  Right subclavian Port-A-Cath tip in the upper SVC.
IMPRESSION: 1.  No acute abdominal abnormality.
2.  Stable cardiomegaly.  No acute cardiopulmonary disease.

## 2011-12-27 MED ORDER — DEXTROSE 5 % IV SOLN
1.5000 g | INTRAVENOUS | Status: DC
  Filled 2011-12-27: qty 1.5

## 2011-12-27 MED ORDER — SODIUM CHLORIDE 0.9 % IV SOLN: INTRAVENOUS | Status: DC

## 2011-12-28 ENCOUNTER — Encounter (HOSPITAL_COMMUNITY): Payer: Self-pay | Admitting: Pharmacy Technician

## 2011-12-28 ENCOUNTER — Encounter (HOSPITAL_COMMUNITY): Payer: Self-pay | Admitting: Vascular Surgery

## 2011-12-28 ENCOUNTER — Inpatient Hospital Stay (HOSPITAL_COMMUNITY): Payer: Medicare Other

## 2011-12-28 ENCOUNTER — Inpatient Hospital Stay (HOSPITAL_COMMUNITY)
Admission: RE | Admit: 2011-12-28 | Discharge: 2012-01-05 | DRG: 237 | Disposition: A | Discharge status: Home Health | Payer: Medicare Other | Source: Ambulatory Visit | Attending: Surgery | Admitting: Surgery

## 2011-12-28 ENCOUNTER — Encounter (HOSPITAL_COMMUNITY): Payer: Self-pay | Admitting: *Deleted

## 2011-12-28 ENCOUNTER — Ambulatory Visit (HOSPITAL_COMMUNITY): Payer: Medicare Other | Admitting: Vascular Surgery

## 2011-12-28 ENCOUNTER — Encounter (HOSPITAL_COMMUNITY)
Admission: RE | Disposition: A | Discharge status: Home Health | Payer: Self-pay | Source: Ambulatory Visit | Attending: Surgery

## 2011-12-28 DIAGNOSIS — Z8614 Personal history of Methicillin resistant Staphylococcus aureus infection: Secondary | ICD-10-CM

## 2011-12-28 DIAGNOSIS — R609 Edema, unspecified: Secondary | ICD-10-CM

## 2011-12-28 DIAGNOSIS — I10 Essential (primary) hypertension: Secondary | ICD-10-CM

## 2011-12-28 DIAGNOSIS — L98499 Non-pressure chronic ulcer of skin of other sites with unspecified severity: Secondary | ICD-10-CM

## 2011-12-28 DIAGNOSIS — N2581 Secondary hyperparathyroidism of renal origin: Secondary | ICD-10-CM | POA: Diagnosis present

## 2011-12-28 DIAGNOSIS — G4700 Insomnia, unspecified: Secondary | ICD-10-CM | POA: Diagnosis present

## 2011-12-28 DIAGNOSIS — I743 Embolism and thrombosis of arteries of the lower extremities: Secondary | ICD-10-CM

## 2011-12-28 DIAGNOSIS — D62 Acute posthemorrhagic anemia: Secondary | ICD-10-CM | POA: Diagnosis not present

## 2011-12-28 DIAGNOSIS — I82409 Acute embolism and thrombosis of unspecified deep veins of unspecified lower extremity: Secondary | ICD-10-CM

## 2011-12-28 DIAGNOSIS — Z86711 Personal history of pulmonary embolism: Secondary | ICD-10-CM

## 2011-12-28 DIAGNOSIS — F17200 Nicotine dependence, unspecified, uncomplicated: Secondary | ICD-10-CM | POA: Diagnosis present

## 2011-12-28 DIAGNOSIS — F319 Bipolar disorder, unspecified: Secondary | ICD-10-CM

## 2011-12-28 DIAGNOSIS — Z86718 Personal history of other venous thrombosis and embolism: Secondary | ICD-10-CM

## 2011-12-28 DIAGNOSIS — I120 Hypertensive chronic kidney disease with stage 5 chronic kidney disease or end stage renal disease: Secondary | ICD-10-CM | POA: Diagnosis present

## 2011-12-28 DIAGNOSIS — E119 Type 2 diabetes mellitus without complications: Secondary | ICD-10-CM | POA: Diagnosis present

## 2011-12-28 DIAGNOSIS — L97909 Non-pressure chronic ulcer of unspecified part of unspecified lower leg with unspecified severity: Secondary | ICD-10-CM | POA: Diagnosis present

## 2011-12-28 DIAGNOSIS — I739 Peripheral vascular disease, unspecified: Principal | ICD-10-CM | POA: Diagnosis present

## 2011-12-28 DIAGNOSIS — J9600 Acute respiratory failure, unspecified whether with hypoxia or hypercapnia: Secondary | ICD-10-CM | POA: Diagnosis not present

## 2011-12-28 DIAGNOSIS — E876 Hypokalemia: Secondary | ICD-10-CM | POA: Diagnosis not present

## 2011-12-28 DIAGNOSIS — N390 Urinary tract infection, site not specified: Secondary | ICD-10-CM

## 2011-12-28 DIAGNOSIS — I252 Old myocardial infarction: Secondary | ICD-10-CM

## 2011-12-28 DIAGNOSIS — Z992 Dependence on renal dialysis: Secondary | ICD-10-CM

## 2011-12-28 DIAGNOSIS — E11622 Type 2 diabetes mellitus with other skin ulcer: Secondary | ICD-10-CM

## 2011-12-28 DIAGNOSIS — N186 End stage renal disease: Secondary | ICD-10-CM | POA: Diagnosis present

## 2011-12-28 DIAGNOSIS — I2510 Atherosclerotic heart disease of native coronary artery without angina pectoris: Secondary | ICD-10-CM | POA: Diagnosis present

## 2011-12-28 HISTORY — PX: AORTA - BILATERAL FEMORAL ARTERY BYPASS GRAFT: SHX1175

## 2011-12-28 HISTORY — PX: FEMORAL-POPLITEAL BYPASS GRAFT: SHX937

## 2011-12-28 LAB — URINALYSIS, ROUTINE W REFLEX MICROSCOPIC
Glucose, UA: NEGATIVE mg/dL
Protein, ur: 100 mg/dL — AB
Specific Gravity, Urine: 1.021 (ref 1.005–1.030)
Urobilinogen, UA: 0.2 mg/dL (ref 0.0–1.0)

## 2011-12-28 LAB — CBC
HCT: 30 % — ABNORMAL LOW (ref 36.0–46.0)
Hemoglobin: 10 g/dL — ABNORMAL LOW (ref 12.0–15.0)
MCH: 31.4 pg (ref 26.0–34.0)
MCHC: 33.3 g/dL (ref 30.0–36.0)
MCV: 94.3 fL (ref 78.0–100.0)
RBC: 3.18 MIL/uL — ABNORMAL LOW (ref 3.87–5.11)

## 2011-12-28 LAB — BASIC METABOLIC PANEL
BUN: 37 mg/dL — ABNORMAL HIGH (ref 6–23)
CO2: 20 mEq/L (ref 19–32)
Calcium: 8.3 mg/dL — ABNORMAL LOW (ref 8.4–10.5)
Creatinine, Ser: 4.63 mg/dL — ABNORMAL HIGH (ref 0.50–1.10)
GFR calc non Af Amer: 11 mL/min — ABNORMAL LOW (ref >90)
Glucose, Bld: 257 mg/dL — ABNORMAL HIGH (ref 70–99)
Sodium: 134 mEq/L — ABNORMAL LOW (ref 135–145)

## 2011-12-28 LAB — POCT I-STAT 3, ART BLOOD GAS (G3+)
O2 Saturation: 99 %
pCO2 arterial: 41 mmHg (ref 35.0–45.0)
pH, Arterial: 7.327 — ABNORMAL LOW (ref 7.350–7.400)

## 2011-12-28 LAB — POCT I-STAT 4, (NA,K, GLUC, HGB,HCT)
Glucose, Bld: 117 mg/dL — ABNORMAL HIGH (ref 70–99)
HCT: 30 % — ABNORMAL LOW (ref 36.0–46.0)
Hemoglobin: 9.9 g/dL — ABNORMAL LOW (ref 12.0–15.0)
Potassium: 3.5 mEq/L (ref 3.5–5.1)
Sodium: 132 mEq/L — ABNORMAL LOW (ref 135–145)
Sodium: 135 mEq/L (ref 135–145)

## 2011-12-28 LAB — URINE MICROSCOPIC-ADD ON

## 2011-12-28 LAB — PROTIME-INR: INR: 1.14 (ref 0.00–1.49)

## 2011-12-28 SURGERY — CREATION, BYPASS, ARTERIAL, AORTA TO FEMORAL, BILATERAL, USING GRAFT
Anesthesia: General | Site: Leg Upper | Laterality: Right | Wound class: Clean

## 2011-12-28 MED ORDER — METOPROLOL TARTRATE 1 MG/ML IV SOLN
5.0000 mg | Freq: Four times a day (QID) | INTRAVENOUS | Status: DC
  Administered 2011-12-28: 2.5 mg via INTRAVENOUS
  Administered 2011-12-30 – 2012-01-02 (×10): 5 mg via INTRAVENOUS
  Filled 2011-12-28 (×21): qty 5

## 2011-12-28 MED ORDER — METOPROLOL TARTRATE 1 MG/ML IV SOLN
2.0000 mg | INTRAVENOUS | Status: DC | PRN
  Administered 2011-12-29: 2 mg via INTRAVENOUS
  Filled 2011-12-28: qty 5

## 2011-12-28 MED ORDER — INSULIN ASPART 100 UNIT/ML ~~LOC~~ SOLN: 0.0000 [IU] | Freq: Three times a day (TID) | SUBCUTANEOUS | Status: DC

## 2011-12-28 MED ORDER — PROMETHAZINE HCL 25 MG/ML IJ SOLN: 50.0000 mg | Freq: Four times a day (QID) | INTRAMUSCULAR | Status: DC | PRN

## 2011-12-28 MED ORDER — ALBUTEROL SULFATE HFA 108 (90 BASE) MCG/ACT IN AERS: 2.0000 | INHALATION_SPRAY | Freq: Four times a day (QID) | RESPIRATORY_TRACT | Status: DC | PRN

## 2011-12-28 MED ORDER — MORPHINE SULFATE 4 MG/ML IJ SOLN: 0.0500 mg/kg | INTRAMUSCULAR | Status: DC | PRN

## 2011-12-28 MED ORDER — 0.9 % SODIUM CHLORIDE (POUR BTL) OPTIME
TOPICAL | Status: DC | PRN
  Administered 2011-12-28: 1000 mL

## 2011-12-28 MED ORDER — MIDAZOLAM HCL 5 MG/5ML IJ SOLN
INTRAMUSCULAR | Status: DC | PRN
  Administered 2011-12-28: 4 mg via INTRAVENOUS

## 2011-12-28 MED ORDER — HYDROMORPHONE HCL PF 1 MG/ML IJ SOLN: 0.2500 mg | INTRAMUSCULAR | Status: DC | PRN

## 2011-12-28 MED ORDER — SODIUM CHLORIDE 0.9 % IV SOLN
INTRAVENOUS | Status: DC | PRN
  Administered 2011-12-28: 08:00:00 via INTRAVENOUS

## 2011-12-28 MED ORDER — SYSTANE LID WIPES EX PADS: 1.0000 "application " | MEDICATED_PAD | Freq: Every day | CUTANEOUS | Status: DC

## 2011-12-28 MED ORDER — SODIUM BICARBONATE 8.4 % IV SOLN
INTRAVENOUS | Status: DC | PRN
  Administered 2011-12-28: 50 mL via INTRAVENOUS

## 2011-12-28 MED ORDER — TRAVOPROST (BAK FREE) 0.004 % OP SOLN
1.0000 [drp] | Freq: Every day | OPHTHALMIC | Status: DC
  Administered 2011-12-28 – 2012-01-04 (×8): 1 [drp] via OPHTHALMIC
  Filled 2011-12-28: qty 2.5

## 2011-12-28 MED ORDER — GUAIFENESIN-DM 100-10 MG/5ML PO SYRP: 15.0000 mL | ORAL_SOLUTION | ORAL | Status: DC | PRN

## 2011-12-28 MED ORDER — HYDRALAZINE HCL 20 MG/ML IJ SOLN
10.0000 mg | INTRAMUSCULAR | Status: DC | PRN
  Filled 2011-12-28: qty 0.5

## 2011-12-28 MED ORDER — ALUM & MAG HYDROXIDE-SIMETH 200-200-20 MG/5ML PO SUSP: 15.0000 mL | ORAL | Status: DC | PRN

## 2011-12-28 MED ORDER — LOTEPREDNOL ETABONATE 0.5 % OP SUSP
1.0000 [drp] | Freq: Four times a day (QID) | OPHTHALMIC | Status: DC
  Administered 2011-12-28 – 2012-01-05 (×28): 1 [drp] via OPHTHALMIC
  Filled 2011-12-28: qty 5

## 2011-12-28 MED ORDER — MIDAZOLAM HCL 2 MG/2ML IJ SOLN: 2.0000 mg | INTRAMUSCULAR | Status: DC | PRN

## 2011-12-28 MED ORDER — DEXTROSE 5 % IV SOLN
10.0000 mg | INTRAVENOUS | Status: DC | PRN
  Administered 2011-12-28: 3 ug/min via INTRAVENOUS

## 2011-12-28 MED ORDER — HYDROMORPHONE HCL PF 1 MG/ML IJ SOLN
0.5000 mg | INTRAMUSCULAR | Status: DC | PRN
  Administered 2011-12-28: 0.5 mg via INTRAVENOUS
  Administered 2011-12-29: 0.25 mg via INTRAVENOUS
  Administered 2011-12-29 (×2): 0.5 mg via INTRAVENOUS
  Filled 2011-12-28 (×3): qty 1

## 2011-12-28 MED ORDER — DOCUSATE SODIUM 100 MG PO CAPS
100.0000 mg | ORAL_CAPSULE | Freq: Every day | ORAL | Status: DC
  Administered 2012-01-01 – 2012-01-05 (×4): 100 mg via ORAL
  Filled 2011-12-28 (×5): qty 1

## 2011-12-28 MED ORDER — CHLORHEXIDINE GLUCONATE 0.12 % MT SOLN
15.0000 mL | Freq: Two times a day (BID) | OROMUCOSAL | Status: DC
Start: 2011-12-29 — End: 2011-12-29
  Administered 2011-12-29 (×2): 15 mL via OROMUCOSAL
  Filled 2011-12-28 (×2): qty 15

## 2011-12-28 MED ORDER — SODIUM CHLORIDE 0.9 % IV SOLN
0.4000 ug/kg/h | INTRAVENOUS | Status: DC
  Administered 2011-12-28 (×2): 0.7 ug/kg/h via INTRAVENOUS
  Filled 2011-12-28 (×2): qty 4

## 2011-12-28 MED ORDER — DOPAMINE-DEXTROSE 3.2-5 MG/ML-% IV SOLN: 3.0000 ug/kg/min | INTRAVENOUS | Status: DC | PRN

## 2011-12-28 MED ORDER — ARTIFICIAL TEARS OP OINT: TOPICAL_OINTMENT | OPHTHALMIC | Status: DC | PRN

## 2011-12-28 MED ORDER — HEMOSTATIC AGENTS (NO CHARGE) OPTIME
TOPICAL | Status: DC | PRN
  Administered 2011-12-28: 1 via TOPICAL

## 2011-12-28 MED ORDER — METOCLOPRAMIDE HCL 5 MG/ML IJ SOLN
5.0000 mg | Freq: Four times a day (QID) | INTRAMUSCULAR | Status: DC
  Administered 2011-12-28 – 2012-01-02 (×17): 5 mg via INTRAVENOUS
  Filled 2011-12-28 (×23): qty 1

## 2011-12-28 MED ORDER — ONDANSETRON HCL 4 MG/2ML IJ SOLN
4.0000 mg | Freq: Four times a day (QID) | INTRAMUSCULAR | Status: DC | PRN
  Administered 2011-12-30 – 2011-12-31 (×2): 4 mg via INTRAVENOUS
  Filled 2011-12-28 (×2): qty 2

## 2011-12-28 MED ORDER — VECURONIUM BROMIDE 10 MG IV SOLR
INTRAVENOUS | Status: DC | PRN
  Administered 2011-12-28: 2 mg via INTRAVENOUS
  Administered 2011-12-28: 5 mg via INTRAVENOUS
  Administered 2011-12-28: 10 mg via INTRAVENOUS
  Administered 2011-12-28 (×8): 5 mg via INTRAVENOUS
  Administered 2011-12-28: 1 mg via INTRAVENOUS
  Administered 2011-12-28: 5 mg via INTRAVENOUS
  Administered 2011-12-28: 3 mg via INTRAVENOUS
  Administered 2011-12-28: 1 mg via INTRAVENOUS
  Administered 2011-12-28: 3 mg via INTRAVENOUS

## 2011-12-28 MED ORDER — DEXTROSE 5 % IV SOLN
1.5000 g | INTRAVENOUS | Status: DC | PRN
  Administered 2011-12-28: 1.5 g via INTRAVENOUS

## 2011-12-28 MED ORDER — ENOXAPARIN SODIUM 30 MG/0.3ML ~~LOC~~ SOLN
30.0000 mg | SUBCUTANEOUS | Status: DC
  Administered 2011-12-29 – 2012-01-05 (×7): 30 mg via SUBCUTANEOUS
  Filled 2011-12-28 (×8): qty 0.3

## 2011-12-28 MED ORDER — OLOPATADINE HCL 0.2 % OP SOLN: 1.0000 [drp] | Freq: Every day | OPHTHALMIC | Status: DC

## 2011-12-28 MED ORDER — FENTANYL CITRATE 0.05 MG/ML IJ SOLN
INTRAMUSCULAR | Status: DC | PRN
  Administered 2011-12-28: 250 ug via INTRAVENOUS
  Administered 2011-12-28 (×5): 50 ug via INTRAVENOUS
  Administered 2011-12-28: 100 ug via INTRAVENOUS
  Administered 2011-12-28 (×2): 50 ug via INTRAVENOUS
  Administered 2011-12-28: 250 ug via INTRAVENOUS
  Administered 2011-12-28 (×2): 100 ug via INTRAVENOUS
  Administered 2011-12-28 (×2): 50 ug via INTRAVENOUS

## 2011-12-28 MED ORDER — ONDANSETRON HCL 4 MG/2ML IJ SOLN: 4.0000 mg | Freq: Once | INTRAMUSCULAR | Status: DC | PRN

## 2011-12-28 MED ORDER — ACETAMINOPHEN 325 MG RE SUPP
325.0000 mg | RECTAL | Status: DC | PRN
  Filled 2011-12-28 (×2): qty 2

## 2011-12-28 MED ORDER — HEPARIN SODIUM (PORCINE) 1000 UNIT/ML IJ SOLN
INTRAMUSCULAR | Status: DC | PRN
  Administered 2011-12-28: 6000 [IU] via INTRAVENOUS
  Administered 2011-12-28: 5000 [IU] via INTRAVENOUS
  Administered 2011-12-28: 1000 [IU] via INTRAVENOUS
  Administered 2011-12-28: 2000 [IU] via INTRAVENOUS

## 2011-12-28 MED ORDER — ACETAMINOPHEN 325 MG PO TABS
325.0000 mg | ORAL_TABLET | ORAL | Status: DC | PRN
Start: 2011-12-28 — End: 2012-01-05
  Administered 2011-12-31 – 2012-01-05 (×5): 650 mg via ORAL
  Filled 2011-12-28 (×6): qty 2

## 2011-12-28 MED ORDER — PANTOPRAZOLE SODIUM 40 MG IV SOLR
40.0000 mg | INTRAVENOUS | Status: DC
  Administered 2011-12-28 – 2012-01-01 (×5): 40 mg via INTRAVENOUS
  Filled 2011-12-28 (×6): qty 40

## 2011-12-28 MED ORDER — OXYCODONE HCL 5 MG PO TABS: 5.0000 mg | ORAL_TABLET | ORAL | Status: DC | PRN

## 2011-12-28 MED ORDER — PHENYLEPHRINE HCL 10 MG/ML IJ SOLN
INTRAMUSCULAR | Status: DC | PRN
  Administered 2011-12-28: 40 ug via INTRAVENOUS

## 2011-12-28 MED ORDER — HYDROMORPHONE HCL PF 1 MG/ML IJ SOLN: 0.5000 mg | INTRAMUSCULAR | Status: DC | PRN

## 2011-12-28 MED ORDER — PROPOFOL 10 MG/ML IV EMUL
INTRAVENOUS | Status: DC | PRN
  Administered 2011-12-28: 70 mg via INTRAVENOUS

## 2011-12-28 MED ORDER — PROTAMINE SULFATE 10 MG/ML IV SOLN
INTRAVENOUS | Status: DC | PRN
  Administered 2011-12-28: 50 mg via INTRAVENOUS
  Administered 2011-12-28: 40 mg via INTRAVENOUS
  Administered 2011-12-28: 10 mg via INTRAVENOUS

## 2011-12-28 MED ORDER — SODIUM CHLORIDE 0.9 % IV SOLN
INTRAVENOUS | Status: DC | PRN
  Administered 2011-12-28 (×3): via INTRAVENOUS

## 2011-12-28 MED ORDER — SODIUM CHLORIDE 0.9 % IV SOLN
200.0000 ug | INTRAVENOUS | Status: DC | PRN
  Administered 2011-12-28: .7 ug/kg/h via INTRAVENOUS

## 2011-12-28 MED ORDER — HYDROMORPHONE HCL PF 1 MG/ML IJ SOLN
INTRAMUSCULAR | Status: AC
  Filled 2011-12-28: qty 1

## 2011-12-28 MED ORDER — BIOTENE DRY MOUTH MT LIQD
15.0000 mL | Freq: Four times a day (QID) | OROMUCOSAL | Status: DC
  Administered 2011-12-29 (×2): 15 mL via OROMUCOSAL

## 2011-12-28 MED ORDER — LABETALOL HCL 5 MG/ML IV SOLN
10.0000 mg | INTRAVENOUS | Status: DC | PRN
  Filled 2011-12-28: qty 4

## 2011-12-28 MED ORDER — HOMATROPINE HBR 5 % OP SOLN
1.0000 [drp] | Freq: Three times a day (TID) | OPHTHALMIC | Status: DC
  Administered 2011-12-28 – 2012-01-05 (×23): 1 [drp] via OPHTHALMIC
  Filled 2011-12-28: qty 5

## 2011-12-28 MED ORDER — SODIUM CHLORIDE 0.9 % IR SOLN
Status: DC | PRN
  Administered 2011-12-28: 10:00:00

## 2011-12-28 MED ORDER — INSULIN ASPART 100 UNIT/ML ~~LOC~~ SOLN
0.0000 [IU] | SUBCUTANEOUS | Status: DC
  Administered 2011-12-28: 5 [IU] via SUBCUTANEOUS
  Administered 2011-12-29 (×2): 3 [IU] via SUBCUTANEOUS

## 2011-12-28 MED ORDER — CIPROFLOXACIN IN D5W 400 MG/200ML IV SOLN
400.0000 mg | INTRAVENOUS | Status: DC
  Administered 2011-12-29: 400 mg via INTRAVENOUS
  Filled 2011-12-28 (×2): qty 200

## 2011-12-28 MED ORDER — DEXTROSE 5 % IV SOLN
1.5000 g | INTRAVENOUS | Status: DC
  Filled 2011-12-28: qty 1.5

## 2011-12-28 MED ORDER — OFLOXACIN 0.3 % OP SOLN
1.0000 [drp] | Freq: Three times a day (TID) | OPHTHALMIC | Status: DC
  Administered 2011-12-28 – 2012-01-05 (×24): 1 [drp] via OPHTHALMIC
  Filled 2011-12-28 (×2): qty 5

## 2011-12-28 MED ORDER — SODIUM CHLORIDE 0.9 % IV SOLN
INTRAVENOUS | Status: DC
  Administered 2011-12-28 – 2012-01-05 (×4): via INTRAVENOUS

## 2011-12-28 MED ORDER — POLYETHYL GLYCOL-PROPYL GLYCOL 0.4-0.3 % OP SOLN: 1.0000 [drp] | Freq: Four times a day (QID) | OPHTHALMIC | Status: DC

## 2011-12-28 SURGICAL SUPPLY — 71 items
BANDAGE ESMARK 6X9 LF (GAUZE/BANDAGES/DRESSINGS) ×2 IMPLANT
BANDAGE GAUZE ELAST BULKY 4 IN (GAUZE/BANDAGES/DRESSINGS) ×3 IMPLANT
BNDG ESMARK 6X9 LF (GAUZE/BANDAGES/DRESSINGS) ×3
CANISTER SUCTION 2500CC (MISCELLANEOUS) ×3 IMPLANT
CLIP TI MEDIUM 24 (CLIP) ×3 IMPLANT
CLIP TI WIDE RED SMALL 24 (CLIP) ×3 IMPLANT
CLOTH BEACON ORANGE TIMEOUT ST (SAFETY) ×3 IMPLANT
COVER MAYO STAND STRL (DRAPES) ×3 IMPLANT
COVER SURGICAL LIGHT HANDLE (MISCELLANEOUS) ×6 IMPLANT
CUFF TOURNIQUET SINGLE 24IN (TOURNIQUET CUFF) ×3 IMPLANT
DERMABOND ADVANCED (GAUZE/BANDAGES/DRESSINGS) ×3
DERMABOND ADVANCED .7 DNX12 (GAUZE/BANDAGES/DRESSINGS) ×6 IMPLANT
DRAPE WARM FLUID 44X44 (DRAPE) ×3 IMPLANT
DRSG PAD ABDOMINAL 8X10 ST (GAUZE/BANDAGES/DRESSINGS) ×3 IMPLANT
ELECT BLADE 4.0 EZ CLEAN MEGAD (MISCELLANEOUS) ×3
ELECT BLADE 6.5 EXT (BLADE) IMPLANT
ELECT CAUTERY BLADE 6.4 (BLADE) ×3 IMPLANT
ELECT REM PT RETURN 9FT ADLT (ELECTROSURGICAL) ×3
ELECTRODE BLDE 4.0 EZ CLN MEGD (MISCELLANEOUS) ×2 IMPLANT
ELECTRODE REM PT RTRN 9FT ADLT (ELECTROSURGICAL) ×2 IMPLANT
FELT TEFLON 4 X1 (Mesh General) ×3 IMPLANT
GLOVE BIO SURGEON STRL SZ 6.5 (GLOVE) ×9 IMPLANT
GLOVE BIOGEL PI IND STRL 6.5 (GLOVE) ×4 IMPLANT
GLOVE BIOGEL PI IND STRL 7.0 (GLOVE) ×6 IMPLANT
GLOVE BIOGEL PI IND STRL 7.5 (GLOVE) ×2 IMPLANT
GLOVE BIOGEL PI INDICATOR 6.5 (GLOVE) ×2
GLOVE BIOGEL PI INDICATOR 7.0 (GLOVE) ×3
GLOVE BIOGEL PI INDICATOR 7.5 (GLOVE) ×1
GLOVE SURG SS PI 7.5 STRL IVOR (GLOVE) ×3 IMPLANT
GOWN PREVENTION PLUS XXLARGE (GOWN DISPOSABLE) ×3 IMPLANT
GOWN STRL NON-REIN LRG LVL3 (GOWN DISPOSABLE) ×12 IMPLANT
GRAFT HEMASHIELD 14X7MM (Vascular Products) ×3 IMPLANT
GRAFT PROPATEN W/RING 6X80X60 (Vascular Products) ×3 IMPLANT
HEMOSTAT SNOW SURGICEL 2X4 (HEMOSTASIS) IMPLANT
HEMOSTAT SURGICEL 2X14 (HEMOSTASIS) IMPLANT
INSERT FOGARTY 61MM (MISCELLANEOUS) ×3 IMPLANT
INSERT FOGARTY SM (MISCELLANEOUS) ×6 IMPLANT
KIT BASIN OR (CUSTOM PROCEDURE TRAY) ×3 IMPLANT
KIT ROOM TURNOVER OR (KITS) ×3 IMPLANT
NS IRRIG 1000ML POUR BTL (IV SOLUTION) ×9 IMPLANT
PACK AORTA (CUSTOM PROCEDURE TRAY) ×3 IMPLANT
PAD ARMBOARD 7.5X6 YLW CONV (MISCELLANEOUS) ×6 IMPLANT
PADDING CAST COTTON 6X4 STRL (CAST SUPPLIES) ×3 IMPLANT
PENCIL BUTTON HOLSTER BLD 10FT (ELECTRODE) ×3 IMPLANT
PUNCH AORTIC ROTATE 5MM 8IN (MISCELLANEOUS) ×3 IMPLANT
SPECIMEN JAR MEDIUM (MISCELLANEOUS) ×3 IMPLANT
SPONGE GAUZE 4X4 12PLY (GAUZE/BANDAGES/DRESSINGS) ×3 IMPLANT
SPONGE LAP 18X18 X RAY DECT (DISPOSABLE) ×6 IMPLANT
SUT ETHIBOND 5 LR DA (SUTURE) IMPLANT
SUT GORETEX 6.0 TT9 (SUTURE) ×9 IMPLANT
SUT PDS AB 1 TP1 54 (SUTURE) ×6 IMPLANT
SUT PROLENE 3 0 SH 48 (SUTURE) ×3 IMPLANT
SUT PROLENE 5 0 C 1 24 (SUTURE) IMPLANT
SUT PROLENE 5 0 C 1 36 (SUTURE) ×9 IMPLANT
SUT PROLENE 7 0 BV1 MDA (SUTURE) ×3 IMPLANT
SUT SILK 2 0 TIES 17X18 (SUTURE)
SUT SILK 2 0SH CR/8 30 (SUTURE) ×3 IMPLANT
SUT SILK 2-0 18XBRD TIE BLK (SUTURE) IMPLANT
SUT SILK 3 0 TIES 17X18 (SUTURE)
SUT SILK 3-0 18XBRD TIE BLK (SUTURE) IMPLANT
SUT VIC AB 2-0 CT1 27 (SUTURE) ×3
SUT VIC AB 2-0 CT1 36 (SUTURE) ×12 IMPLANT
SUT VIC AB 2-0 CT1 TAPERPNT 27 (SUTURE) ×6 IMPLANT
SUT VIC AB 3-0 SH 27 (SUTURE) ×6
SUT VIC AB 3-0 SH 27X BRD (SUTURE) ×12 IMPLANT
SUT VICRYL 4-0 PS2 18IN ABS (SUTURE) ×18 IMPLANT
TOWEL BLUE STERILE X RAY DET (MISCELLANEOUS) ×6 IMPLANT
TOWEL OR 17X24 6PK STRL BLUE (TOWEL DISPOSABLE) ×12 IMPLANT
TOWEL OR 17X26 10 PK STRL BLUE (TOWEL DISPOSABLE) ×6 IMPLANT
TRAY FOLEY CATH 14FRSI W/METER (CATHETERS) IMPLANT
WATER STERILE IRR 1000ML POUR (IV SOLUTION) ×6 IMPLANT

## 2011-12-28 NOTE — OR Nursing (Signed)
Second call placed to SICU, [phone]

## 2011-12-28 NOTE — Interval H&P Note (Signed)
History and Physical Interval Note:  12/28/2011 7:24 AM  [name]  has presented today for surgery, with the diagnosis of Peripheral Vascular Disease  The various methods of treatment have been discussed with the patient and family. After consideration of risks, benefits and other options for treatment, the patient has consented to  Procedure(s) (LRB): AORTA BIFEMORAL BYPASS GRAFT (N/A) as a surgical intervention .  The patients' history has been reviewed, patient examined, no change in status, stable for surgery.  I have reviewed the patients' chart and labs.  Questions were answered to the patient's satisfaction.     Delina Kruczek IV, V. WELLS

## 2011-12-28 NOTE — Anesthesia Postprocedure Evaluation (Signed)
  Anesthesia Post-op Note  Patient: [name]  Procedure(s) Performed: Procedure(s) (LRB): AORTA BIFEMORAL BYPASS GRAFT (N/A) BYPASS GRAFT FEMORAL-POPLITEAL ARTERY (Right)  Patient Location: SICU  Anesthesia Type: General  Level of Consciousness: sedated, unresponsive and Patient remains intubated per anesthesia plan  Airway and Oxygen Therapy: Patient remains intubated per anesthesia plan and Patient placed on Ventilator (see vital sign flow sheet for setting)  Post-op Pain: none  Post-op Assessment: Post-op Vital signs reviewed, Patient's Cardiovascular Status Stable, Respiratory Function Stable, Patent Airway, No signs of Nausea or vomiting and Pain level controlled  Post-op Vital Signs: stable  Complications: No apparent anesthesia complications

## 2011-12-28 NOTE — Transfer of Care (Signed)
Immediate Anesthesia Transfer of Care Note  Patient: [name]  Procedure(s) Performed: Procedure(s) (LRB): AORTA BIFEMORAL BYPASS GRAFT (N/A) BYPASS GRAFT FEMORAL-POPLITEAL ARTERY (Right)  Patient Location: SICU  Anesthesia Type: General  Level of Consciousness: sedated and unresponsive  Airway & Oxygen Therapy: Patient remains intubated per anesthesia plan and Patient placed on Ventilator (see vital sign flow sheet for setting)  Post-op Assessment: Report given to PACU RN and Post -op Vital signs reviewed and stable  Post vital signs: Reviewed and stable  Complications: No apparent anesthesia complications

## 2011-12-28 NOTE — Progress Notes (Signed)
eLink Physician-Brief Progress Note Patient Name: [name] DOB: 18-Apr-1971 MRN: [id number]  Date of Service  12/28/2011   HPI/Events of Note   Nurse called to re-order dilaudid, pt came from PACU and dilaudid order got discontinued. Pt had bypass, is allergic to morphine but tolerated dilaudid well.  eICU Interventions  Will start dilaudid prn.      Catha Brow 12/28/2011, 9:20 PM

## 2011-12-28 NOTE — OR Nursing (Signed)
First call placed to SICU to inform them surgery closing time.

## 2011-12-28 NOTE — Progress Notes (Signed)
0745 Dr.Brabham returned call, informed him of pt's UTI per patient and pt. Being drowsy,but arousable. Stated to send ua/culture by catheterization.  16 F catheter inserted by Bevelyn Ngo, RN. Scant urine in foley bag, insufficient for collection of UA. Also tried to aspirated from foley port to get UA, unsuccessful. Urine thick and purlent. 0710 notified Dr. Myra Gianotti of catheter being inserted and unable to obtain UA due to insufficient urine return.

## 2011-12-28 NOTE — Op Note (Signed)
[facility]  Patient name: ANVITHA HUTMACHER MRN: [id number] DOB: [birth year] Sex: female  12/28/2011 Pre-operative Diagnosis: Right Leg Ulcer x2 Post-operative diagnosis:  Same Surgeon:  Jorge Ny Assistants:  Della Goo, P.A. Procedure:   Aorta-Bifemoral Bypass with bilateral profundoplasty (14X7 Dacron graft)             Right common femoral endarterectomy             Right femoral popliteal (above knee) bypass with 6mm external ringed Propatent PTFE Anesthesia:  General Blood Loss:  See anesthesia record (500) Specimens:  Aortic and right femoral plaque  Findings:  Extremely calcified aortic plaque.  Aortic anastomosis was end to end.Right femoral artery required endarterectomy.  Both femoral anastomosis were to the distal CFa extending down on to the profunda for approximately 2mm.  The right GSV was dissected out .  It became too small in the distal thigh.  I left the vein in situ.  A 6mm external ringed Propatent PTFE graft was sewn end to side to the popliteal artery at the level of the patella.  The proximal anastomosis was off of the hood of the ABF right femoral anastomosis  Indications:  41 yo with ESRD and diabetes who has had 2 leg ulcers for > 6 months.  This is a limb threatening situation.  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  She received cardiac clearance  Procedure:  The patient was identified in the holding area and taken to [facility] OR ROOM 09  The patient was then placed supine on the table. general anesthesia was administered.  The patient was prepped and draped in the usual sterile fashion.  A time out was called and antibiotics were administered.  Longitudinal Inguinal incisions were made bilaterally.  Cautery and sharp dissection were used to isolate the common, superficial, and profunda femoral arteries bilaterally.  They were individually isolated and encircled with vessel loops.  Dissection  was then carried out under the inguinal ligament.  Crossing iliac veins were divided between 2-0 silk ties.  The groin incisions were then packed with moist gauze, and attention was turned to the abdomen.  A midline incision was created from the xiphoid to below the umbilicus.  Cautery was used to divide the subcutaneous tissue.  The midline fascia was identified and opened .  The peritoneal cavity was open sharply and extended throughout its length with cautery.  The abdomen was inspected.  There was no gross pathology.  An omni tract retractor was placed.  The transverse colon was reflected cephalad and the small bowel was mobilized to the patients right side.  The ligament of Treitz was divided sharply.  Retractors were then placed.  I divided the inferior mesenteric vein between 2-0 silk ties.  Cautery was the used to open the remainder of the retroperitoneum.  The aorta was then dissected out.  The IMA was isolated with a vessel loop.  The left renal vein was dissected free.  The aorta was exposed from the level of the renal arteries down to the bifurcation.  A tunnel was created to the groin incision, making sure I was posterior to the ureter.  An umbilical tape was passed through the tunnel.  Circumferential exposure of the aorta was obtained at the level of the renal arteries.  He aorta was very calcified circumferentially.  The was only one soft spot amenable for clamping.  The common iliac arteries were relatively disease free and  so they were circumferentially isolated because I did not feel that I could get hemostasis by clamping the distal aorta.  The patient was heparinized.  After the heparin had circulated, the common iliac arteries were occluded first with coarctation clamps.  I then occluded the proximal aorta with a Harkin clamp.  The aorta was then opened and transected proximal to the inferior mesenteric artery.  I had to perform an endarterectomy of the aorta in order to over sew the distal  aorta.  The plaque was sent as a specimen.  I then over sewed the distal aorta proximal to the IMA using a 3-0 prolene and incorporating a felt strip.  Attention was then turned to the proximal aorta.  I had to perform  An endarterectomy to get aortic wall that I could sew to.  Once I was satisfied with the endarterectomy, a 14x7 Dacron graft was selected.  #, 3-0 prolene sutures was used to place horizontal mattress sutures between the back wall of the aorta and the graft.  A felt strip was incorporated.  Once the back was secured, the lateral 2 sutures were run circumferentially around the aorta, incorporating the felt strip.  Once the proximal anastomosis was completed, the Harkin clamp was released.  There was good hemostasis.  The limbs of the graft were brought through the previously created tunnel into the groins.  The right femoral anastomosis was completed first.  The common femoral, superficial femoral and profunda femoral arteries were occluded with vascular clamps.  A #11 blade was used to make an arteriotomy which was extended longitudinally with potts scissors.  There was extensive nearly circumferential plaque.  I felt that endarterectomy was required an d so an elevator was used to perform an endarterectomy in the common femoral artery extending into the profunda femoral artery.  A good distal endpoint was obtained.  I placed 3 7-0 prolene tacking sutures in the profunda.  The right limb of the aortic graft was cut to the appropriate length and then spatulated to fit the size of the arteriotomy.  A running anastomosis was created with 5-0 prolene.  The distal extent of the anastomosis was down onto the profunda about 2mm.  Prior to completion, the appropriate flush maneuvers were performed.  The anastomosis was then completed and blood flow was re-established to the right leg.  Attention was then turned to the left groin.  The common, superficial femoral and profunda femoral arteries were occluded  with vascular clamps.  A #11 blade was used to create an arteriotomy which was extended with potts scissors.  The arteriotomy was extended down onto the profunda femoral artery for 2mm.  The left limb of the aortic graft was cut to the appropriate length and spatulated to fit the size of the arteriotomy.  A running anastomosis was created with 5-0 prolene sutures.  Prior to completion, the appropriate flush maneuvers were performed and the anastomosis was completed.  Blood flow was the re-established to the left leg.  There were triphasic signals in bilateral profunda arteries which were graft dependant.  I then re-inspected the aortic anastomosis and the abdomen.  The bowel looked pink, and there was no bleeding.  The omni-tract was then removed and a wet towel was placed over the abdomen to minimize evaporative losses. 50 mg of protamine were give.   At this point I felt the patient was stable and that I could proceed with right femoral popliteal bypass.  I began to harvest the greater saphenous vein  on the right.  It was full mobilized via skip incisions.  Side branches were ligated between 3-0 silk ties and metal clips.  In the final above knee incision, the vein became very small, and I did not feel as though it would serve as a good conduit for bypass so I decided to use PTFE.  Through the distal thigh incision, I exposed the popliteal artery.  It was very calcified and so I dissected it down to behind the patella where it became soft.  A subsartorial tunnel was then created with a long tunneller.  A 6 mm external ringed Propatent PTFE graft was selected and brought through the tunnel.  The patient was re-heparinized.  I had initially planned on bringing the proximal anastomosis off of the profunda, but this was very small, and so I occluded the ABF graft and elected to create a graftotomy in the hood of the right limb of the aorta-bifemoral graft.  A 5 mm punch was used.  The PTFE graft was spatulated and a  running anastomosis was created with GORE CV6 suture.  Prior to completion, the appropriate flush maneuvers were performed and the anastomosis was completed.  There was pulsatile flow through the PTFE graft.  I then placed a tourniquet on the proximal thigh.  The leg was exsanguinated with an es marque.  The tourniquet was inflated to 250 mmHg of pressure.  A #11 blade was used to create an arteriotomy in the popliteal artery behind the patella.  It was extended longitudinally.  I then passed sequential dilators up to a #3 without resistance.  I then cut the graft to the appropriate length and created an end to side anastomosis with GORE CV 6 suture.  Prior to completion, the appropriate flush maneuvers were performed and the anastomosis was completed.  There was graft dependant flow in the PT and AT arteries at the ankle.  I did not shoot an arteriogram due to the patients contrast allergy.  50 mg of protamine was given.  I then re-explored the abdomen.  All of the bowel was viable and pink.  I did not feel that re-implantation of the IMA was required.  There was a doppler signal on the anti-mesenteric side of the bowel.  The abdomen was irrigated.  The bowel was placed back into its anatomic position.  The retro-peritoneal tissue was closed over the graft with 2-0 vicryl.  The fascia was closed with 2 running #1 PDS suture.  The subcutaneous tissue was closed with 3-0 vicryl and the skin was closed with 4-0 vicryl.  The groins were then examined and irrigated.  There was good hemostasis.  The femoral sheath was closed with 2-0 vicryl.  The subcutaneous tissue was closed in multiple layers of 3-0 vicryl and the skin was closed with 4-0 vicryl.  The vein harvest incisions were closed with 2 layers of 3-0 vicryl, and the above knee incision was closed by re-approximating the fascia with 2-0 vicryl and the subcutaneous tissue and skin were closed with 3-0 and 4-0 vicryl.  Dermabond was placed on all incisions.  Due to  the patients co-morbidities and length of the procedure, the decision was made to leave the patient intubated and she was taken to the ICU in stable incision.  Disposition:  To PACU in stable condition.   Juleen China, M.D. [facility] Office: [phone] Pager:  [phone]

## 2011-12-28 NOTE — Progress Notes (Signed)
ANTIBIOTIC CONSULT NOTE - INITIAL  Pharmacy Consult:  Cipro Indication: UTI  Allergies  Allergen Reactions  . Compazine (Prochlorperazine) Swelling    Per echart records  . Contrast Media (Iodinated Diagnostic Agents)     Per echart records  . Meperidine Hcl Swelling  . Metformin And Related Other (See Comments)    'just not myself, was told not to take metformin'  . Morphine Swelling  . Nubain (Nalbuphine Hcl) Swelling  . Topiramate (Topamax) Swelling    Tongue swelling - per echart records  . Toradol (Ketorolac Tromethamine) Swelling    Patient Measurements: Weight: 170 lb (77.111 kg)  Vital Signs: Temp: 98.1 F (36.7 C) (05/03 0612) Temp src: Oral (05/03 0612) BP: 131/55 mmHg (05/03 1655) Pulse Rate: 87  (05/03 1655) Intake/Output from this shift: Total I/O In: 3979 [I.V.:3200; Blood:779] Out: 917 [Urine:17; Other:400; Blood:500]  Labs:  Basename 12/28/11 0601  WBC --  HGB 10.2*  PLT --  LABCREA --  CREATININE --   The CrCl is unknown because both a height and weight (above a minimum accepted value) are required for this calculation. No results found for this basename: VANCOTROUGH:2,VANCOPEAK:2,VANCORANDOM:2,GENTTROUGH:2,GENTPEAK:2,GENTRANDOM:2,TOBRATROUGH:2,TOBRAPEAK:2,TOBRARND:2,AMIKACINPEAK:2,AMIKACINTROU:2,AMIKACIN:2, in the last 72 hours   Microbiology: Recent Results (from the past 720 hour(s))  SURGICAL PCR SCREEN     Status: Normal   Collection Time   12/24/11  3:14 PM      Component Value Range Status Comment   MRSA, PCR NEGATIVE  NEGATIVE  Final    Staphylococcus aureus NEGATIVE  NEGATIVE  Final     Medical History: Past Medical History  Diagnosis Date  . DVT (deep venous thrombosis)     DVT HISTORY  . Diabetes mellitus   . Migraine     HISTORY  . Anxiety and depression   . CAD (coronary artery disease)     Last cath 2009:  LAD stent 80% stenosis. Circumflex 60-70% stenosis AV groove, right coronary artery 50-60% stenosis. She did have  cutting balloon angioplasty of the LAD lesion into a diagonal. However, there was restenosis of this and it was managed medically.  . Bipolar 1 disorder   . Hyperlipidemia   . Hyperparathyroidism   . PE (pulmonary embolism)     HISTORY  . Dialysis patient   . Anemia   . Renal failure     dialysis [place] t,th sat  . Nephrotic syndrome   . Peripheral vascular disease   . MRSA infection   . Anxiety   . Depression   . Gastroparesis   . Complication of anesthesia   . PONV (postoperative nausea and vomiting)     x3 days post anesth. on 12/19/2011  . Myocardial infarction     x3 , last one - 10 yrs. ago  . Hypertension     sees Dr. Antoine Poche - [place], last stress test 11/2011, see result in EPIC, saw Dr. Antoine Poche as well at that time    . Shortness of breath   . Recurrent upper respiratory infection (URI)     Bronchitis 11/2011 - saw Dr. Loney Hering, treating currently /w antibiotic   . Pneumonia     [facility]- 2012  . GERD (gastroesophageal reflux disease)      Assessment: 26 YOF s/p femoral-popliteal artery bypass to resume Cipro on 12/29/11 for UTI.  Patient received cefuroxime today prior to procedure.  She's an ESRD patient on HD TTS.   Plan:  - Cipro 400mg  IV Q24H, start 12/29/11 - Monitor C/S, clinical course    Desaree Downen D. Laney Potash,  PharmD, BCPS Pager:  [phone] 12/28/2011, 5:26 PM      .

## 2011-12-28 NOTE — H&P (View-Only) (Signed)
[facility] [place]   Patient name: [name] MRN: [id number] DOB: 12/24/1970 Sex: female     Chief Complaint  Patient presents with  . PVD    Pt. needs full cardiac eval for surgical clearance    HISTORY OF PRESENT ILLNESS: The patient is back today for followup of her right lower the ulcers which have been present for several months. She had a duplex studies which showed ABI of 0.15 on the right and 0.48 on the left. She underwent arteriogram which revealed right external iliac occlusion as well as bilateral superficial femoral artery occlusion. She is not a candidate for endovascular repair and therefore comes back today to discuss her surgical options. She has a history of left leg DVT  Past Medical History  Diagnosis Date  . DVT (deep venous thrombosis)     DVT HISTORY  . Diabetes mellitus   . Migraine     HISTORY  . Anxiety and depression   . CAD (coronary artery disease)     Last cath 2009:  LAD stent 80% stenosis. Circumflex 60-70% stenosis AV groove, right coronary artery 50-60% stenosis. She did have cutting balloon angioplasty of the LAD lesion into a diagonal. However, there was restenosis of this and it was managed medically.  . Bipolar 1 disorder   . Hyperlipidemia   . Hyperparathyroidism   . PE (pulmonary embolism)     HISTORY  . Dialysis patient   . Anemia   . Hypertension   . Myocardial infarction   . Renal failure     dialysis [place] t,th sat  . Nephrotic syndrome   . Peripheral vascular disease   . MRSA infection   . Anxiety   . Depression   . Gastroparesis     Past Surgical History  Procedure Date  . Back surgery X 4  . Cholecystectomy   . Incise and drain abcess     OF THIGHS FROM INSULIN INJECTIONS  . Heart stents   . Portacath placement 2003  . Eye surgery   . Cataract extraction w/phaco 11/09/2011    Procedure: CATARACT EXTRACTION PHACO AND INTRAOCULAR LENS PLACEMENT (IOC);  Surgeon: Gary A Rankin, MD;  Location: [facility]  OR;  Service: Ophthalmology;  Laterality: Right;  . Pars plana vitrectomy 11/09/2011    Procedure: PARS PLANA VITRECTOMY WITH 23 GAUGE;  Surgeon: Gary A Rankin, MD;  Location: [facility] OR;  Service: Ophthalmology;  Laterality: Right;  Ahmed Valve; Scleral Reinforcement Graft  . Dg av dialysis  shunt access exist*l* or 12/13/11    left arm    History   Social History  . Marital Status: Divorced    Spouse Name: N/A    Number of Children: N/A  . Years of Education: N/A   Occupational History  . Not on file.   Social History Main Topics  . Smoking status: Current Everyday Smoker -- 2.0 packs/day for 20 years    Types: Cigarettes  . Smokeless tobacco: Never Used   Comment: 1 pack a day  . Alcohol Use: No  . Drug Use: No  . Sexually Active: No   Other Topics Concern  . Not on file   Social History Narrative  . No narrative on file    Family History  Problem Relation Age of Onset  . Transient ischemic attack Mother   . Hepatitis Sister   . Diabetes type II Other     Allergies as of 12/17/2011 - Review Complete 12/17/2011  Allergen Reaction Noted  .   Compazine (prochlorperazine) Swelling 06/25/2011  . Contrast media (iodinated diagnostic agents)  06/25/2011  . Meperidine hcl Swelling   . Metformin and related Other (See Comments) 08/20/2011  . Morphine Swelling   . Nubain (nalbuphine hcl) Swelling 04/15/2011  . Topiramate (topamax) Swelling 06/25/2011  . Toradol (ketorolac tromethamine) Swelling 04/15/2011       REVIEW OF SYSTEMS: No changes from prior visit  PHYSICAL EXAMINATION:   Vital signs are BP 129/87  Pulse 108  Resp 18  Ht 5' 10" (1.778 m)  Wt 170 lb (77.111 kg)  BMI 24.39 kg/m2  SpO2 100% General: The patient appears their stated age. HEENT:  No gross abnormalities Pulmonary:  Non labored breathing Abdomen: Soft and non-tender Musculoskeletal: There are no major deformities. Neurologic: No focal weakness or paresthesias are detected, Skin: 2 large  ulcers one on the lateral malleolus dissecting at the tibial tuberosity Psychiatric: The patient has normal affect. Cardiovascular: There is a regular rate and rhythm without significant murmur appreciated.   Diagnostic Studies Vein mapping was performed today which shows her to have an adequate caliber right greater saphenous vein. She also had carotid Doppler studies today minimal disease on the right and 40-59% stenosis on the left. Assessment: Right leg ulceration x2 Plan: I discussed options of revascularization for limb salvage with the patient. First she needs an inflow procedure. We discussed a femoral-femoral bypass versus an aortobifemoral bypass graft. She does appear to have a proximally 25% stenosis at the origin of her left common iliac artery. I feel the most durable operation for her will be in aortobifemoral bypass graft. Because of the location of her ulcers, I do not feel that reestablishing inflow would be adequate to heal her ulcer at the ankle. For that reason she require a simultaneous femoral popliteal bypass graft. I feel is most likely can be an above-knee bypass graft. I plan on using vein from her right leg. Her operation is been scheduled for Friday, May 3. She will continue to remain off plaque she has received cardiac clearance. She did have a nuclear stress  [name], M.D. [facility] Office: [phone] Pager:  [phone]    

## 2011-12-28 NOTE — Brief Op Note (Signed)
12/28/2011  9:13 PM  PATIENT:  [name]  41 y.o. female  PRE-OPERATIVE DIAGNOSIS:  Peripheral Vascular Disease  POST-OPERATIVE DIAGNOSIS:  Peripheral Vascular Disease  PROCEDURE:  Procedure(s) (LRB): AORTA BIFEMORAL BYPASS GRAFT (N/A) BYPASS GRAFT FEMORAL-POPLITEAL ARTERY (Right) Right common Femoral endarterectomy  SURGEON:  Surgeon(s) and Role:    Nada Libman, MD - Primary  PHYSICIAN ASSISTANT:  Della Goo, P.A.   ANESTHESIA:   general  EBL:     BLOOD ADMINISTERED:2 PLTS  DRAINS: none   LOCAL MEDICATIONS USED:  NONE  SPECIMEN:  Source of Specimen:  Aortic and femoral plaque  DISPOSITION OF SPECIMEN:  PATHOLOGY  COUNTS:  YES  TOURNIQUET:   Total Tourniquet Time Documented: Thigh (Right) - 35 minutes  DICTATION: .Reubin Milan Dictation  PLAN OF CARE: Admit to inpatient   PATIENT DISPOSITION:  ICU - intubated and hemodynamically stable.   Delay start of Pharmacological VTE agent (>24hrs) due to surgical blood loss or risk of bleeding: yes

## 2011-12-28 NOTE — Anesthesia Postprocedure Evaluation (Signed)
  Anesthesia Post-op Note  Patient: [name]  Procedure(s) Performed: Procedure(s) (LRB): AORTA BIFEMORAL BYPASS GRAFT (N/A) BYPASS GRAFT FEMORAL-POPLITEAL ARTERY (Right)  Patient Location: SICU  Anesthesia Type: General  Level of Consciousness: sedated, unresponsive and Patient remains intubated per anesthesia plan  Airway and Oxygen Therapy: Patient remains intubated per anesthesia plan and Patient placed on Ventilator (see vital sign flow sheet for setting)  Post-op Pain: none  Post-op Assessment: Post-op Vital signs reviewed, Patient's Cardiovascular Status Stable, Respiratory Function Stable, Patent Airway, No signs of Nausea or vomiting and Pain level controlled  Post-op Vital Signs: stable  Complications: No apparent anesthesia complications 

## 2011-12-28 NOTE — Anesthesia Procedure Notes (Signed)
Procedures [id number]: Procedures: Right IJ Theone Murdoch Catheter Insertion: The patient was identified and consent obtained.  TO was performed, and full barrier precautions were used.  The skin was anesthetized with lidocaine-4cc plain with 25g needle.  Once the vein was located with the 22 ga. needle using ultrasound guidance , the wire was inserted into the vein.  The wire location was confirmed with ultrasound.  The tissue was dilated and the 8.5 [place] cordis catheter was carefully inserted. Afterwards Theone Murdoch catheter was inserted. PA catheter at 44cm.  The patient tolerated the procedure well.   CE

## 2011-12-28 NOTE — OR Nursing (Signed)
1046-Prior to surgery, Dr. Myra Gianotti is aware of patient's UTI.  His decision is to proceed with surgery.

## 2011-12-28 NOTE — Progress Notes (Signed)
cbg 117 at 0600.  Pt. Stated she started cipro yesterday for a urinary track infection. Pt. Drowsy,arousable, stated she  Has slept very little in past 2 days. States she took xanax,, phenergan and percocet today. Family states she has insomnia. Gave above information to  Dr. Randa Evens. Paged Dr. Myra Gianotti.

## 2011-12-28 NOTE — Preoperative (Signed)
Beta Blockers   Reason not to administer Beta Blockers:Not Applicable 

## 2011-12-28 NOTE — Anesthesia Preprocedure Evaluation (Addendum)
Anesthesia Evaluation  Patient identified by MRN, date of birth, ID band Patient awake    Reviewed: Allergy & Precautions, H&P , NPO status , Patient's Chart, lab work & pertinent test results  Airway Mallampati: I TM Distance: >3 FB Neck ROM: Full    Dental   Pulmonary Recent URI ,          Cardiovascular hypertension, Pt. on medications + CAD and + Past MI     Neuro/Psych    GI/Hepatic GERD-  Medicated and Controlled,  Endo/Other  Diabetes mellitus-, Poorly Controlled, Type 2, Insulin Dependent  Renal/GU      Musculoskeletal   Abdominal   Peds  Hematology   Anesthesia Other Findings   Reproductive/Obstetrics                           Anesthesia Physical Anesthesia Plan  ASA: III  Anesthesia Plan: General   Post-op Pain Management:    Induction: Intravenous  Airway Management Planned: Oral ETT  Additional Equipment: Arterial line, CVP, PA Cath and Ultrasound Guidance Line Placement  Intra-op Plan:   Post-operative Plan: Post-operative intubation/ventilation  Informed Consent: I have reviewed the patients History and Physical, chart, labs and discussed the procedure including the risks, benefits and alternatives for the proposed anesthesia with the patient or authorized representative who has indicated his/her understanding and acceptance.     Plan Discussed with: CRNA and Surgeon  Anesthesia Plan Comments:         Anesthesia Quick Evaluation

## 2011-12-29 ENCOUNTER — Inpatient Hospital Stay (HOSPITAL_COMMUNITY): Payer: Medicare Other

## 2011-12-29 DIAGNOSIS — J9600 Acute respiratory failure, unspecified whether with hypoxia or hypercapnia: Secondary | ICD-10-CM

## 2011-12-29 DIAGNOSIS — N186 End stage renal disease: Secondary | ICD-10-CM

## 2011-12-29 DIAGNOSIS — Z992 Dependence on renal dialysis: Secondary | ICD-10-CM

## 2011-12-29 LAB — POCT I-STAT 3, ART BLOOD GAS (G3+)
Acid-base deficit: 4 mmol/L — ABNORMAL HIGH (ref 0.0–2.0)
O2 Saturation: 98 %
O2 Saturation: 99 %
pCO2 arterial: 38.5 mmHg (ref 35.0–45.0)
pCO2 arterial: 39.4 mmHg (ref 35.0–45.0)
pO2, Arterial: 119 mmHg — ABNORMAL HIGH (ref 80.0–100.0)

## 2011-12-29 LAB — COMPREHENSIVE METABOLIC PANEL
ALT: 10 U/L (ref 0–35)
Alkaline Phosphatase: 137 U/L — ABNORMAL HIGH (ref 39–117)
BUN: 42 mg/dL — ABNORMAL HIGH (ref 6–23)
CO2: 19 mEq/L (ref 19–32)
Calcium: 8.8 mg/dL (ref 8.4–10.5)
GFR calc Af Amer: 11 mL/min — ABNORMAL LOW (ref >90)
GFR calc non Af Amer: 9 mL/min — ABNORMAL LOW (ref >90)
Glucose, Bld: 223 mg/dL — ABNORMAL HIGH (ref 70–99)
Sodium: 136 mEq/L (ref 135–145)

## 2011-12-29 LAB — PREPARE PLATELET PHERESIS: Unit division: 0

## 2011-12-29 LAB — CBC
HCT: 29.4 % — ABNORMAL LOW (ref 36.0–46.0)
Hemoglobin: 9.9 g/dL — ABNORMAL LOW (ref 12.0–15.0)
MCH: 31.6 pg (ref 26.0–34.0)
RBC: 3.13 MIL/uL — ABNORMAL LOW (ref 3.87–5.11)

## 2011-12-29 LAB — GLUCOSE, CAPILLARY
Glucose-Capillary: 213 mg/dL — ABNORMAL HIGH (ref 70–99)
Glucose-Capillary: 240 mg/dL — ABNORMAL HIGH (ref 70–99)
Glucose-Capillary: 187 mg/dL — ABNORMAL HIGH (ref 70–99)
Glucose-Capillary: 182 mg/dL — ABNORMAL HIGH (ref 70–99)
Glucose-Capillary: 136 mg/dL — ABNORMAL HIGH (ref 70–99)
Glucose-Capillary: 106 mg/dL — ABNORMAL HIGH (ref 70–99)
Glucose-Capillary: 119 mg/dL — ABNORMAL HIGH (ref 70–99)
Glucose-Capillary: 98 mg/dL (ref 70–99)
Glucose-Capillary: 109 mg/dL — ABNORMAL HIGH (ref 70–99)

## 2011-12-29 LAB — HEMOGLOBIN A1C: Mean Plasma Glucose: 160 mg/dL — ABNORMAL HIGH (ref <117)

## 2011-12-29 LAB — LACTIC ACID, PLASMA: Lactic Acid, Venous: 3.1 mmol/L — ABNORMAL HIGH (ref 0.5–2.2)

## 2011-12-29 LAB — AMYLASE: Amylase: 47 U/L (ref 0–105)

## 2011-12-29 MED ORDER — DEXTROSE 10 % IV SOLN: INTRAVENOUS | Status: DC

## 2011-12-29 MED ORDER — INSULIN REGULAR HUMAN 100 UNIT/ML IJ SOLN: INTRAMUSCULAR | Status: DC

## 2011-12-29 MED ORDER — INSULIN ASPART 100 UNIT/ML ~~LOC~~ SOLN
0.0000 [IU] | SUBCUTANEOUS | Status: DC
  Administered 2011-12-30 (×3): 1 [IU] via SUBCUTANEOUS
  Administered 2011-12-30: 3 [IU] via SUBCUTANEOUS
  Administered 2011-12-30: 1 [IU] via SUBCUTANEOUS
  Administered 2011-12-31 (×2): 3 [IU] via SUBCUTANEOUS
  Administered 2011-12-31: 1 [IU] via SUBCUTANEOUS

## 2011-12-29 MED ORDER — INSULIN GLARGINE 100 UNIT/ML ~~LOC~~ SOLN
10.0000 [IU] | SUBCUTANEOUS | Status: DC
  Administered 2011-12-30: 10 [IU] via SUBCUTANEOUS

## 2011-12-29 MED ORDER — HYDROMORPHONE HCL PF 1 MG/ML IJ SOLN
1.0000 mg | INTRAMUSCULAR | Status: DC | PRN
  Administered 2011-12-29 – 2011-12-31 (×17): 1 mg via INTRAVENOUS
  Filled 2011-12-29 (×16): qty 1

## 2011-12-29 MED ORDER — PIPERACILLIN-TAZOBACTAM IN DEX 2-0.25 GM/50ML IV SOLN
2.2500 g | Freq: Three times a day (TID) | INTRAVENOUS | Status: DC
  Administered 2011-12-29 – 2012-01-05 (×18): 2.25 g via INTRAVENOUS
  Filled 2011-12-29 (×26): qty 50

## 2011-12-29 MED ORDER — DARBEPOETIN ALFA-POLYSORBATE 25 MCG/0.42ML IJ SOLN
25.0000 ug | INTRAMUSCULAR | Status: DC
  Administered 2011-12-29: 25 ug via INTRAVENOUS
  Filled 2011-12-29: qty 0.42

## 2011-12-29 MED ORDER — INSULIN ASPART 100 UNIT/ML ~~LOC~~ SOLN: 0.0000 [IU] | SUBCUTANEOUS | Status: DC

## 2011-12-29 MED ORDER — RENA-VITE PO TABS
1.0000 | ORAL_TABLET | Freq: Every day | ORAL | Status: DC
  Administered 2011-12-30 – 2012-01-01 (×2): 1 via ORAL
  Filled 2011-12-29 (×5): qty 1

## 2011-12-29 MED ORDER — NICOTINE 21 MG/24HR TD PT24
21.0000 mg | MEDICATED_PATCH | TRANSDERMAL | Status: DC
  Administered 2011-12-29 – 2012-01-04 (×7): 21 mg via TRANSDERMAL
  Filled 2011-12-29 (×8): qty 1

## 2011-12-29 MED ORDER — SODIUM CHLORIDE 0.9 % IV BOLUS (SEPSIS)
500.0000 mL | Freq: Once | INTRAVENOUS | Status: AC
  Administered 2011-12-29: 500 mL via INTRAVENOUS

## 2011-12-29 MED ORDER — DARBEPOETIN ALFA-POLYSORBATE 25 MCG/0.42ML IJ SOLN
INTRAMUSCULAR | Status: AC
  Administered 2011-12-29: 13:00:00
  Filled 2011-12-29: qty 0.42

## 2011-12-29 MED ORDER — ALPRAZOLAM 0.5 MG PO TABS
0.5000 mg | ORAL_TABLET | Freq: Two times a day (BID) | ORAL | Status: DC | PRN
  Administered 2011-12-30 – 2012-01-02 (×8): 0.5 mg via ORAL
  Filled 2011-12-29 (×8): qty 1

## 2011-12-29 MED ORDER — INSULIN REGULAR HUMAN 100 UNIT/ML IJ SOLN
INTRAMUSCULAR | Status: DC
  Administered 2011-12-29: 1.8 [IU]/h via INTRAVENOUS
  Filled 2011-12-29: qty 1

## 2011-12-29 NOTE — Progress Notes (Signed)
eLink Physician-Brief Progress Note Patient Name: [name] DOB: 12-02-1970 MRN: [id number]  Date of Service  12/29/2011   HPI/Events of Note   1. Diaphoretic, borderline low MAP, and increasingly tachycardic  eICU Interventions   1. Stop RRT for now 2. Send lactate, PCT       Sorayah Schrodt 12/29/2011, 4:56 PM

## 2011-12-29 NOTE — Progress Notes (Signed)
[facility] [place]  Daily Progress Note  Assessment/Planning: POD #1 s/p Aobifemoral bypass with R fem-pop   PULM//NEURO: Vent and sed. Per PCCM, possible extubation today  CV: appropriate tachycardia for clinical situation  ABD: keep bdg for 24-48 hours  VASC: good signals in both feet  FEN: keep on fluid while NPO  RENAL: per Nephrology  HEME: stable H/H  ID: cont Cipro for UTI  Subjective  - 1 Day Post-Op  Intubated and sedated  Objective Filed Vitals:   12/29/11 0400 12/29/11 0500 12/29/11 0600 12/29/11 0700  BP: 87/47 109/54 104/55 116/54  Pulse: 95 102 103 102  Temp: 98.4 F (36.9 C) 98.2 F (36.8 C) 98.1 F (36.7 C) 98.2 F (36.8 C)  TempSrc:      Resp: 16 16 17 17   Height:      Weight: 191 lb 12.8 oz (87 kg)     SpO2: 100% 99% 99% 99%    Intake/Output Summary (Last 24 hours) at 12/29/11 0746 Last data filed at 12/29/11 0700  Gross per 24 hour  Intake 4342.09 ml  Output   2227 ml  Net 2115.09 ml   NEURO Sed off, track to voice briefly PULM  On vent CV  Tachy, RR GI  soft, approp grimace to palp, bandaged VASC  B groin incision bdg, L inc c/d/i FEN Insulin drip RENAL minimal UOP HEME/ID cipro for UTI  Laboratory CBC    Component Value Date/Time   WBC 13.1* 12/29/2011 0425   HGB 9.9* 12/29/2011 0425   HCT 29.4* 12/29/2011 0425   PLT 237 12/29/2011 0425    BMET    Component Value Date/Time   NA 136 12/29/2011 0425   K 4.2 12/29/2011 0425   CL 98 12/29/2011 0425   CO2 19 12/29/2011 0425   GLUCOSE 223* 12/29/2011 0425   BUN 42* 12/29/2011 0425   CREATININE 5.22* 12/29/2011 0425   CREATININE 1.78* 10/17/2008 2000   CALCIUM 8.8 12/29/2011 0425   CALCIUM 8.3* 08/21/2011 0533   GFRNONAA 9* 12/29/2011 0425   GFRAA 11* 12/29/2011 0425    Leonides Sake, MD [facility] [place] Office: [phone] Pager: [phone]  12/29/2011, 7:46 AM

## 2011-12-29 NOTE — Progress Notes (Signed)
Around five pm nurse called to bedside to assist HD nurse.  Upon entrance pt noted to be diaphoretic, tachycardic and hypotensive, with a noted decrease in skin color.  Lips dusky while stating "Help me please, help me".  Nurse then changed out SPO2 probe increased O2 to 6L .  Oxygen level 96%, BP 105/54, HR 155.  Blood sugar 98.  Lopressor 2mg  given per MD order.  Nurse called Elink and explained situation to MD.  New orders received and noted. Will continue to monitor situation.

## 2011-12-29 NOTE — Progress Notes (Signed)
eLink Physician-Brief Progress Note Patient Name: [name] DOB: 1971-02-28 MRN: [id number]  Date of Service  12/29/2011   HPI/Events of Note  Start phase 3 hyperglycemia protocol   eICU Interventions     Intervention Category Intermediate Interventions: Other:  Collin Hendley 12/29/2011, 11:31 PM

## 2011-12-29 NOTE — Procedures (Signed)
Extubation Procedure Note  Patient Details:   Name: [name] DOB: 06-Nov-1970 MRN: [id number]   Airway Documentation:     Evaluation  O2 sats: stable throughout Complications: No apparent complications Patient did tolerate procedure well. Bilateral Breath Sounds: Diminished;Clear Suctioning: Airway Yes Pt wean was successful.  Extubated at 11:20 with no complications  Rhilee Currin, Rudy Jew 12/29/2011, 11:24 AM

## 2011-12-29 NOTE — Procedures (Signed)
Pt seen on HD.  Ap 250 vp 140. SBP 162 so will increase fluid removal to 4 liters.

## 2011-12-29 NOTE — Consult Note (Signed)
[name] is an 40 y.o. female referred by Dr Myra Gianotti   Chief Complaint: ESRD, anemia HPI: 41 yo wf with ESRD on HD in [place] on TTHSAT schedule by Dr Kristian Covey.  She underwent elective Aortobifem yest. Last HD was Thurs.  She is currently intubated.  Has Perm cath that was recently placed for failed Lt UA AVG.  Past Medical History  Diagnosis Date  . DVT (deep venous thrombosis)     DVT HISTORY  . Diabetes mellitus   . Migraine     HISTORY  . Anxiety and depression   . CAD (coronary artery disease)     Last cath 2009:  LAD stent 80% stenosis. Circumflex 60-70% stenosis AV groove, right coronary artery 50-60% stenosis. She did have cutting balloon angioplasty of the LAD lesion into a diagonal. However, there was restenosis of this and it was managed medically.  . Bipolar 1 disorder   . Hyperlipidemia   . Hyperparathyroidism   . PE (pulmonary embolism)     HISTORY  . Dialysis patient   . Anemia   . Renal failure     dialysis [place] t,th sat  . Nephrotic syndrome   . Peripheral vascular disease   . MRSA infection   . Anxiety   . Depression   . Gastroparesis   . Complication of anesthesia   . PONV (postoperative nausea and vomiting)     x3 days post anesth. on 12/19/2011  . Myocardial infarction     x3 , last one - 10 yrs. ago  . Hypertension     sees Dr. Antoine Poche - [place], last stress test 11/2011, see result in EPIC, saw Dr. Antoine Poche as well at that time    . Shortness of breath   . Recurrent upper respiratory infection (URI)     Bronchitis 11/2011 - saw Dr. Loney Hering, treating currently /w antibiotic   . Pneumonia     [facility]- 2012  . GERD (gastroesophageal reflux disease)     Past Surgical History  Procedure Date  . Incise and drain abcess     OF THIGHS FROM INSULIN INJECTIONS  . Heart stents   . Portacath placement 2003  . Cataract extraction w/phaco 11/09/2011    Procedure: CATARACT EXTRACTION PHACO AND INTRAOCULAR LENS PLACEMENT (IOC);  Surgeon: Edmon Crape, MD;   Location: [facility] OR;  Service: Ophthalmology;  Laterality: Right;  . Pars plana vitrectomy 11/09/2011    Procedure: PARS PLANA VITRECTOMY WITH 23 GAUGE;  Surgeon: Edmon Crape, MD;  Location: [facility] OR;  Service: Ophthalmology;  Laterality: Right;  Ahmed Valve; Scleral Reinforcement Graft  . Dg av dialysis  shunt access exist*l* or 12/13/11    left arm  . Dialysis cath inserted & portacath      dialysis catheter inserted & portacath d/c'd- 12/19/2011  . Back surgery X 4    x4 times   . Cholecystectomy   . Eye surgery   . Cardiac catheterization     Family History  Problem Relation Age of Onset  . Transient ischemic attack Mother   . Hepatitis Sister   . Diabetes type II Other   . Anesthesia problems Neg Hx   . Hypotension Neg Hx   . Malignant hyperthermia Neg Hx   . Pseudochol deficiency Neg Hx    Social History:  reports that she has been smoking Cigarettes.  She has a 20 pack-year smoking history. She has never used smokeless tobacco. She reports that she does not drink alcohol or use illicit  drugs.  Allergies:  Allergies  Allergen Reactions  . Compazine (Prochlorperazine) Swelling    Per echart records  . Contrast Media (Iodinated Diagnostic Agents)     Per echart records  . Meperidine Hcl Swelling  . Metformin And Related Other (See Comments)    'just not myself, was told not to take metformin'  . Morphine Swelling  . Nubain (Nalbuphine Hcl) Swelling  . Topiramate (Topamax) Swelling    Tongue swelling - per echart records  . Toradol (Ketorolac Tromethamine) Swelling    Medications Prior to Admission  Medication Sig Dispense Refill  . albuterol (PROVENTIL HFA;VENTOLIN HFA) 108 (90 BASE) MCG/ACT inhaler Inhale 2 puffs into the lungs every 6 (six) hours as needed. Shortness of breath       . ALPRAZolam (XANAX) 1 MG tablet Take 1 tablet (1 mg total) by mouth 4 (four) times daily as needed for anxiety. For anxiety   30 tablet  0  . amLODipine (NORVASC) 10 MG tablet Take 10 mg by  mouth daily.       . ARIPiprazole (ABILIFY) 2 MG tablet Take 2 mg by mouth daily.       . Artificial Tear Ointment (REFRESH P.M. OP) Place 1 application into the right eye 4 (four) times daily.       Marland Kitchen b complex-vitamin c-folic acid (NEPHRO-VITE) 0.8 MG TABS Take 0.8 mg by mouth at bedtime.       . butalbital-acetaminophen-caffeine (FIORICET, ESGIC) 50-325-40 MG per tablet Take 1 tablet by mouth 4 (four) times daily as needed. for acute migraine pain      . cinacalcet (SENSIPAR) 30 MG tablet Take 30 mg by mouth daily with supper.       . ciprofloxacin (CIPRO) 250 MG tablet Take 250 mg by mouth 2 (two) times daily. Started 11/27/11 for 10 days      . cloNIDine (CATAPRES) 0.2 MG tablet Take 0.2 mg by mouth 2 (two) times daily.       . clopidogrel (PLAVIX) 75 MG tablet Take 75 mg by mouth daily. Last dose 12/17/2011      . doxycycline (VIBRA-TABS) 100 MG tablet Take 100 mg by mouth daily.      . Eyelid Cleansers (SYSTANE LID WIPES) PADS Place 1 application into the right eye daily.      . homatropine 5 % ophthalmic solution Place 1 drop into the right eye 3 (three) times daily.      . insulin glargine (LANTUS) 100 UNIT/ML injection Inject 30 Units into the skin at bedtime.      . insulin lispro (HUMALOG) 100 UNIT/ML injection Inject 7-15 Units into the skin 4 (four) times daily -  before meals and at bedtime. TAKING ON SLIDING SCALE 200-249=7 units 250-299=10 units 300-349=12 units >350=15 units      . lisinopril (PRINIVIL,ZESTRIL) 40 MG tablet Take 40 mg by mouth daily.       . metoCLOPramide (REGLAN) 10 MG tablet Take 20 mg by mouth 4 (four) times daily.      . Olopatadine HCl (PATADAY) 0.2 % SOLN Apply 1 drop to eye daily.      Marland Kitchen omeprazole (PRILOSEC) 40 MG capsule Take 40 mg by mouth daily.       Marland Kitchen oxyCODONE-acetaminophen (PERCOCET) 10-325 MG per tablet Take 1 tablet by mouth every 4 (four) hours. For pain      . PARoxetine (PAXIL) 40 MG tablet Take 80 mg by mouth every morning.       Bertram Gala Glycol-Propyl  Glycol (SYSTANE ULTRA) 0.4-0.3 % SOLN Place 1 drop into both eyes 4 (four) times daily.      Marland Kitchen PRESCRIPTION MEDICATION Place 1 application vaginally 2 (two) times daily. Pt uses this cream vaginally 2 times daily      . promethazine (PHENERGAN) 25 MG tablet Take 25 mg by mouth every 6 (six) hours as needed. For nausea      . sevelamer (RENVELA) 800 MG tablet Take 1,600-4,000 mg by mouth 5 (five) times daily. *Take 5 tablets daily with each meal and take 2 tablets with snacks**      . sodium bicarbonate 650 MG tablet Take 650 mg by mouth 2 (two) times daily.        . sucralfate (CARAFATE) 1 G tablet Take 1 g by mouth 2 (two) times daily before a meal.       . temazepam (RESTORIL) 30 MG capsule Take 30 mg by mouth at bedtime.      . torsemide (DEMADEX) 100 MG tablet Take 100 mg by mouth daily.       . travoprost, benzalkonium, (TRAVATAN) 0.004 % ophthalmic solution Place 1 drop into the right eye at bedtime.      . traZODone (DESYREL) 100 MG tablet Take 200 mg by mouth at bedtime.      Marland Kitchen loteprednol (LOTEMAX) 0.5 % ophthalmic suspension Place 1 drop into the right eye 4 (four) times daily.      Marland Kitchen ofloxacin (OCUFLOX) 0.3 % ophthalmic solution Place 1 drop into the right eye 3 (three) times daily.       . promethazine (PHENERGAN) 50 MG/ML injection Inject 50 mg into the muscle every 6 (six) hours as needed. For nausea         Lab Results: UA: TNTC wbc  Basename 12/29/11 0425 12/28/11 2318 12/28/11 1715  WBC 13.1* -- 12.9*  HGB 9.9* 9.9* 10.0*  HCT 29.4* 29.0* 30.0*  PLT 237 -- 237   BMET  Basename 12/29/11 0425 12/28/11 2318 12/28/11 1715  NA 136 135 134*  K 4.2 4.3 3.8  CL 98 -- 97  CO2 19 -- 20  GLUCOSE 223* 231* 257*  BUN 42* -- 37*  CREATININE 5.22* -- 4.63*  CALCIUM 8.8 -- 8.3*  PHOS -- -- --   LFT  Basename 12/29/11 0425  PROT 5.4*  ALBUMIN 2.1*  AST 27  ALT 10  ALKPHOS 137*  BILITOT 0.1*  BILIDIR --  IBILI --   Dg Chest Portable 1  View  12/29/2011  *RADIOLOGY REPORT*  Clinical Data: Status post thoracic surgery.  PORTABLE CHEST - 1 VIEW  Comparison: 12/28/2011.  Findings: The support apparatus is stable.  No complicating features.  The lungs demonstrate improved aeration.  A small right effusion persists.  IMPRESSION:  1.  Stable support apparatus. 2.  Small right pleural effusion and minimal streaky basilar atelectasis.  Original Report Authenticated By: P. Loralie Champagne, M.D.   Dg Chest Portable 1 View  12/28/2011  *RADIOLOGY REPORT*  Clinical Data: 41 year old female status post line placement.  PORTABLE CHEST - 1 VIEW  Comparison: 12/24/2011 and earlier.  Findings: AP portable semi upright view 1715 hours. Endotracheal tube tip below the clavicles.  Enteric tube placed, courses to the abdomen and tip not included.  Right IJ approach Swan-Ganz catheter placed, tip at the level of the main pulmonary artery.  Stable preexisting left IJ dual lumen catheter.  Stable left axillary metal vascular stent.  Slightly lower lung volumes.  Mild increased perihilar crowding.  No pneumothorax, pulmonary edema or confluent opacity.  Probable continued small right effusion.  IMPRESSION: 1.  Lines and tubes appear appropriately placed with positions detailed above. No pneumothorax. 2.  Continued small right effusion.  Mild increased perihilar atelectasis.  Original Report Authenticated By: Ulla Potash III, M.D.    ROS: Unobtainable as pt is intubated  PHYSICAL EXAM: Blood pressure 113/58, pulse 99, temperature 98.2 F (36.8 C), temperature source Oral, resp. rate 16, height 5\' 9"  (1.753 m), weight 87 kg (191 lb 12.8 oz), SpO2 99.00%. HEENT: PERRLA, EOMI. NG in place NECK:no JVD  Lt IJ perm cath LUNGS:clear ant CARDIAC:sl tachy but reg no MRG LKG:MWNU BS, soft but diffusely tender sec wounds UVO:ZDGUY edema.  Rt leg bandaged.  Ischemic toes on Rt NEURO:Arouseable, follows commands and is able to nod appropriately  Assessment: 1. SP  Aortobifem 2. ESRD 3. Anemia on epogen 4. Sec HPTH not requiring Vit D 5. DM 6. HTN PLAN: 1. Will plan HD today 2. Resume epogen 3. Resume BP meds when extubated 4. Urine culture   Nirvi Boehler T 12/29/2011, 11:04 AM

## 2011-12-29 NOTE — Progress Notes (Addendum)
eLink Physician-Brief Progress Note Patient Name: SUHEYLA MORTELLARO DOB: 1971-01-03 MRN: [id number]  Date of Service  12/29/2011   HPI/Events of Note   1. venous lactate 3.7 2. PCT mildly up   eICU Interventions   1. would not call septic shock yet, no EGDT yet 2. suspect could be volume depleted p HD, 500cc NS now 3. blood cx + CXR 4. recheck lactate at 10PM      Lactate still elevated on recheck. Further review of chart shows evidence of pyuria (for which cipro was started). Grew out E Coli 10/2011 that was fairly resistant (including cipro). Will broaden to zosyn until cult and sens back.   Nakiya Rallis 12/29/2011, 6:23 PM

## 2011-12-29 NOTE — Consult Note (Signed)
Name: [name] MRN: [id number] DOB: February 11, 1971    LOS: 1  PCCM ADMISSION NOTE   History of Present Illness: 41 y/o F with PMHx of severe peripheral vasc disease, DM, HTN, ESRD on HD, CAD, who was admitted to [facility] for vascular surgery and aorta bifemoral bypassgraft, R bypas graft femoral popliteal and R common femoral endarterectomy. Pt did well during the surgery and she was kept intubated and on mechanical ventilation and PCCM service was consulted for vent management. Of note, pt was found to have UA with bacteria and was started on Cipro.  Lines / Drains: RIJ 12/28/11 HD catheter  Cultures: Urine culture  Antibiotics: Cipro     Past Medical History  Diagnosis Date  . DVT (deep venous thrombosis)     DVT HISTORY  . Diabetes mellitus   . Migraine     HISTORY  . Anxiety and depression   . CAD (coronary artery disease)     Last cath 2009:  LAD stent 80% stenosis. Circumflex 60-70% stenosis AV groove, right coronary artery 50-60% stenosis. She did have cutting balloon angioplasty of the LAD lesion into a diagonal. However, there was restenosis of this and it was managed medically.  . Bipolar 1 disorder   . Hyperlipidemia   . Hyperparathyroidism   . PE (pulmonary embolism)     HISTORY  . Dialysis patient   . Anemia   . Renal failure     dialysis [place] t,th sat  . Nephrotic syndrome   . Peripheral vascular disease   . MRSA infection   . Anxiety   . Depression   . Gastroparesis   . Complication of anesthesia   . PONV (postoperative nausea and vomiting)     x3 days post anesth. on 12/19/2011  . Myocardial infarction     x3 , last one - 10 yrs. ago  . Hypertension     sees Dr. Antoine Poche - [place], last stress test 11/2011, see result in EPIC, saw Dr. Antoine Poche as well at that time    . Shortness of breath   . Recurrent upper respiratory infection (URI)     Bronchitis 11/2011 - saw Dr. Loney Hering, treating currently /w antibiotic   . Pneumonia     [facility]- 2012  . GERD  (gastroesophageal reflux disease)    Past Surgical History  Procedure Date  . Incise and drain abcess     OF THIGHS FROM INSULIN INJECTIONS  . Heart stents   . Portacath placement 2003  . Cataract extraction w/phaco 11/09/2011    Procedure: CATARACT EXTRACTION PHACO AND INTRAOCULAR LENS PLACEMENT (IOC);  Surgeon: Edmon Crape, MD;  Location: [facility] OR;  Service: Ophthalmology;  Laterality: Right;  . Pars plana vitrectomy 11/09/2011    Procedure: PARS PLANA VITRECTOMY WITH 23 GAUGE;  Surgeon: Edmon Crape, MD;  Location: [facility] OR;  Service: Ophthalmology;  Laterality: Right;  Ahmed Valve; Scleral Reinforcement Graft  . Dg av dialysis  shunt access exist*l* or 12/13/11    left arm  . Dialysis cath inserted & portacath      dialysis catheter inserted & portacath d/c'd- 12/19/2011  . Back surgery X 4    x4 times   . Cholecystectomy   . Eye surgery   . Cardiac catheterization    Prior to Admission medications   Medication Sig Start Date End Date Taking? Authorizing Provider  albuterol (PROVENTIL HFA;VENTOLIN HFA) 108 (90 BASE) MCG/ACT inhaler Inhale 2 puffs into the lungs every 6 (six) hours as needed. Shortness  of breath    Yes Historical Provider, MD  ALPRAZolam (XANAX) 1 MG tablet Take 1 tablet (1 mg total) by mouth 4 (four) times daily as needed for anxiety. For anxiety  08/23/11  Yes Nimish Normajean Glasgow, MD  amLODipine (NORVASC) 10 MG tablet Take 10 mg by mouth daily.    Yes Historical Provider, MD  ARIPiprazole (ABILIFY) 2 MG tablet Take 2 mg by mouth daily.    Yes Historical Provider, MD  Artificial Tear Ointment (REFRESH P.M. OP) Place 1 application into the right eye 4 (four) times daily.    Yes Historical Provider, MD  b complex-vitamin c-folic acid (NEPHRO-VITE) 0.8 MG TABS Take 0.8 mg by mouth at bedtime.    Yes Historical Provider, MD  butalbital-acetaminophen-caffeine (FIORICET, ESGIC) 50-325-40 MG per tablet Take 1 tablet by mouth 4 (four) times daily as needed. for acute migraine pain    Yes Historical Provider, MD  cinacalcet (SENSIPAR) 30 MG tablet Take 30 mg by mouth daily with supper.    Yes Historical Provider, MD  ciprofloxacin (CIPRO) 250 MG tablet Take 250 mg by mouth 2 (two) times daily. Started 11/27/11 for 10 days   Yes Historical Provider, MD  cloNIDine (CATAPRES) 0.2 MG tablet Take 0.2 mg by mouth 2 (two) times daily.    Yes Historical Provider, MD  clopidogrel (PLAVIX) 75 MG tablet Take 75 mg by mouth daily. Last dose 12/17/2011   Yes Historical Provider, MD  doxycycline (VIBRA-TABS) 100 MG tablet Take 100 mg by mouth daily.   Yes Historical Provider, MD  Eyelid Cleansers (SYSTANE LID WIPES) PADS Place 1 application into the right eye daily.   Yes Historical Provider, MD  homatropine 5 % ophthalmic solution Place 1 drop into the right eye 3 (three) times daily.   Yes Historical Provider, MD  insulin glargine (LANTUS) 100 UNIT/ML injection Inject 30 Units into the skin at bedtime. 08/23/11  Yes Nimish Normajean Glasgow, MD  insulin lispro (HUMALOG) 100 UNIT/ML injection Inject 7-15 Units into the skin 4 (four) times daily -  before meals and at bedtime. TAKING ON SLIDING SCALE 200-249=7 units 250-299=10 units 300-349=12 units >350=15 units   Yes Historical Provider, MD  lisinopril (PRINIVIL,ZESTRIL) 40 MG tablet Take 40 mg by mouth daily.    Yes Historical Provider, MD  metoCLOPramide (REGLAN) 10 MG tablet Take 20 mg by mouth 4 (four) times daily.   Yes Historical Provider, MD  Olopatadine HCl (PATADAY) 0.2 % SOLN Apply 1 drop to eye daily.   Yes Historical Provider, MD  omeprazole (PRILOSEC) 40 MG capsule Take 40 mg by mouth daily.  11/12/11  Yes Malissa Hippo, MD  oxyCODONE-acetaminophen (PERCOCET) 10-325 MG per tablet Take 1 tablet by mouth every 4 (four) hours. For pain   Yes Historical Provider, MD  PARoxetine (PAXIL) 40 MG tablet Take 80 mg by mouth every morning.    Yes Historical Provider, MD  Polyethyl Glycol-Propyl Glycol (SYSTANE ULTRA) 0.4-0.3 % SOLN Place 1 drop  into both eyes 4 (four) times daily.   Yes Historical Provider, MD  PRESCRIPTION MEDICATION Place 1 application vaginally 2 (two) times daily. Pt uses this cream vaginally 2 times daily   Yes Historical Provider, MD  promethazine (PHENERGAN) 25 MG tablet Take 25 mg by mouth every 6 (six) hours as needed. For nausea   Yes Historical Provider, MD  sevelamer (RENVELA) 800 MG tablet Take 1,600-4,000 mg by mouth 5 (five) times daily. *Take 5 tablets daily with each meal and take 2 tablets with snacks**  Yes Historical Provider, MD  sodium bicarbonate 650 MG tablet Take 650 mg by mouth 2 (two) times daily.     Yes Historical Provider, MD  sucralfate (CARAFATE) 1 G tablet Take 1 g by mouth 2 (two) times daily before a meal.    Yes Historical Provider, MD  temazepam (RESTORIL) 30 MG capsule Take 30 mg by mouth at bedtime.   Yes Historical Provider, MD  torsemide (DEMADEX) 100 MG tablet Take 100 mg by mouth daily.    Yes Historical Provider, MD  travoprost, benzalkonium, (TRAVATAN) 0.004 % ophthalmic solution Place 1 drop into the right eye at bedtime.   Yes Historical Provider, MD  traZODone (DESYREL) 100 MG tablet Take 200 mg by mouth at bedtime.   Yes Historical Provider, MD  loteprednol (LOTEMAX) 0.5 % ophthalmic suspension Place 1 drop into the right eye 4 (four) times daily.    Historical Provider, MD  ofloxacin (OCUFLOX) 0.3 % ophthalmic solution Place 1 drop into the right eye 3 (three) times daily.     Historical Provider, MD  promethazine (PHENERGAN) 50 MG/ML injection Inject 50 mg into the muscle every 6 (six) hours as needed. For nausea    Historical Provider, MD   Allergies Allergies  Allergen Reactions  . Compazine (Prochlorperazine) Swelling    Per echart records  . Contrast Media (Iodinated Diagnostic Agents)     Per echart records  . Meperidine Hcl Swelling  . Metformin And Related Other (See Comments)    'just not myself, was told not to take metformin'  . Morphine Swelling  .  Nubain (Nalbuphine Hcl) Swelling  . Topiramate (Topamax) Swelling    Tongue swelling - per echart records  . Toradol (Ketorolac Tromethamine) Swelling    Family History Family History  Problem Relation Age of Onset  . Transient ischemic attack Mother   . Hepatitis Sister   . Diabetes type II Other   . Anesthesia problems Neg Hx   . Hypotension Neg Hx   . Malignant hyperthermia Neg Hx   . Pseudochol deficiency Neg Hx     Social History  reports that she has been smoking Cigarettes.  She has a 20 pack-year smoking history. She has never used smokeless tobacco. She reports that she does not drink alcohol or use illicit drugs.  Review Of Systems  Patient unable to provide  Vital Signs: Temp:  [95.5 F (35.3 C)-98.8 F (37.1 C)] 98.4 F (36.9 C) (05/04 0400) Pulse Rate:  [54-100] 95  (05/04 0400) Resp:  [9-21] 16  (05/04 0400) BP: (75-131)/(42-65) 87/47 mmHg (05/04 0400) SpO2:  [85 %-100 %] 100 % (05/04 0400) Arterial Line BP: (98-129)/(42-59) 117/48 mmHg (05/04 0400) FiO2 (%):  [40 %-51.5 %] 40 % (05/04 0445) Weight:  [77 kg (169 lb 12.1 oz)-87 kg (191 lb 12.8 oz)] 87 kg (191 lb 12.8 oz) (05/04 0400) I/O last 3 completed shifts: In: 15 [I.V.:3226; Blood:779; NG/GT:30; IV Piggyback:1] Out: 1077 [Urine:27; Emesis/NG output:150; Other:400; Blood:500]  Physical Examination: General:  Intubated, mechanically ventilated, no acute distress Neuro:  Mildly Sedated, follows commads HEENT:  PERRL, pink conjunctivae, moist membranes Neck:  Supple, no JVD   Cardiovascular:  RRR, no M/R/G Lungs:  Mildly decreased breath sounds at the pulm bases with no crackles, rhonchi or wheezes. Abdomen:  Soft, nontender, nondistended, bowel sounds present. Abdominal wound with no erythema, bleeding or discharge. Musculoskeletal:  Low extrem edema 1+, leg wound clean. Skin:  No rash  Ventilator settings: Vent Mode:  [-] PRVC FiO2 (%):  [  40 %-51.5 %] 40 % Set Rate:  [14 bmp-16 bmp] 16 bmp Vt  Set:  [500 mL] 500 mL PEEP:  [4.7 cmH20-5 cmH20] 4.8 cmH20 Pressure Support:  [10 cmH20] 10 cmH20 Plateau Pressure:  [16 cmH20] 16 cmH20  Labs and Imaging:  Reviewed.  Please refer to the Assessment and Plan section for relevant results.  ASSESSMENT AND PLAN  NEUROLOGIC A:  Un der mild sedation, but able to follow commands P: -->  Hold sedation -->  Daily WUA  PULMONARY  Lab 12/29/11 0359 12/28/11 1735 12/24/11 1538  PHART 7.332* 7.327* 7.341*  PCO2ART 38.5 41.0 34.7*  PO2ART 119.0* 154.0* 90.5  HCO3 20.4 21.9 18.3*  O2SAT 98.0 99.0 93.3   A:  Pt kept intubated and on mech vent after abdominal/femoral surgery. P: -->  Full mechanical support with PRVC 16/500/5/40% -->  Goal pH>7.30, goal SpO2>92 -->  Follow up ABG -->  Follow up CXR -->  Bronchodilators -->  Daily SBT -->  ABG at 7 AM and if OK start PS 10/5  Cardiology - Hx of CAD, sever peripheral vasc disease, HTN - BP stable  RENAL  Lab 12/28/11 2318 12/28/11 1715 12/28/11 0601 12/24/11 1538  NA 135 134* 132* 130*  K 4.3 3.8 -- --  CL -- 97 -- 92*  CO2 -- 20 -- 17*  BUN -- 37* -- 46*  CREATININE -- 4.63* -- 5.82*  CALCIUM -- 8.3* -- 9.8  MG -- -- -- --  PHOS -- -- -- --   A:  ESRD on HD P: -->  BMP, Mg, Phos in AM - HD today   HEMATOLOGIC  Lab 12/29/11 0425 12/28/11 2318 12/28/11 1730 12/28/11 1715 12/28/11 0601 12/24/11 1538  HGB 9.9* 9.9* -- 10.0* 10.2* 10.9*  HCT 29.4* 29.0* -- 30.0* 30.0* 32.6*  PLT 237 -- -- 237 -- 315  INR -- -- 1.14 -- -- 1.07  APTT -- -- 25 -- -- 34   A:  Anemia P: -->  CBC in AM - No signs of acute bleeding  INFECTIOUS  Lab 12/29/11 0425 12/28/11 1715 12/24/11 1538  WBC 13.1* 12.9* 13.1*  PROCALCITON -- -- --   A:  UTI P: -->  Urine culture pending - On Cipro  ENDOCRINE  Lab 12/29/11 0352 12/29/11 12/28/11 2003  GLUCAP 213* 203* 262*   A:  DM P: -->  ICU hyperglycemia protocol  BEST PRACTICE / DISPOSITION -->  ICU status under PCCM -->  Full  code -->  NPO -->  On Lovenox  -->  Protonix IV for GI Px -->  Ventilator bundle  The patient is critically ill with multiple organ systems failure and requires high complexity decision making for assessment and support, frequent evaluation and titration of therapies, application of advanced monitoring technologies and extensive interpretation of multiple databases. Critical Care Time devoted to patient care services described in this note is 60 minutes.  Juanda Chance, MD. Pulmonary and Critical Care Medicine [facility] Tel: [phone]  12/29/2011, 4:51 AM

## 2011-12-29 NOTE — Progress Notes (Signed)
Physical Therapy Cancellation Note: order received, chart reviewed, pt still intubated and will defer eval until extubated. Thanks Delaney Meigs, PT [phone]

## 2011-12-30 DIAGNOSIS — R609 Edema, unspecified: Secondary | ICD-10-CM

## 2011-12-30 DIAGNOSIS — I82409 Acute embolism and thrombosis of unspecified deep veins of unspecified lower extremity: Secondary | ICD-10-CM

## 2011-12-30 DIAGNOSIS — N186 End stage renal disease: Secondary | ICD-10-CM

## 2011-12-30 DIAGNOSIS — I739 Peripheral vascular disease, unspecified: Secondary | ICD-10-CM

## 2011-12-30 DIAGNOSIS — I2510 Atherosclerotic heart disease of native coronary artery without angina pectoris: Secondary | ICD-10-CM

## 2011-12-30 DIAGNOSIS — I10 Essential (primary) hypertension: Secondary | ICD-10-CM

## 2011-12-30 DIAGNOSIS — F319 Bipolar disorder, unspecified: Secondary | ICD-10-CM

## 2011-12-30 LAB — RENAL FUNCTION PANEL
CO2: 26 mEq/L (ref 19–32)
Calcium: 9.1 mg/dL (ref 8.4–10.5)
Creatinine, Ser: 3.49 mg/dL — ABNORMAL HIGH (ref 0.50–1.10)
GFR calc Af Amer: 18 mL/min — ABNORMAL LOW (ref >90)
GFR calc non Af Amer: 15 mL/min — ABNORMAL LOW (ref >90)
Glucose, Bld: 169 mg/dL — ABNORMAL HIGH (ref 70–99)
Phosphorus: 6.5 mg/dL — ABNORMAL HIGH (ref 2.3–4.6)
Sodium: 139 mEq/L (ref 135–145)

## 2011-12-30 LAB — GLUCOSE, CAPILLARY
Glucose-Capillary: 104 mg/dL — ABNORMAL HIGH (ref 70–99)
Glucose-Capillary: 151 mg/dL — ABNORMAL HIGH (ref 70–99)

## 2011-12-30 NOTE — Consult Note (Signed)
Name: [name] MRN: [id number] DOB: 1971/06/20    LOS: 2  PCCM ADMISSION NOTE   History of Present Illness: 41 y/o F with PMHx of severe peripheral vasc disease, DM, HTN, ESRD on HD, CAD, who was admitted to [facility] for vascular surgery and aorta bifemoral bypassgraft, R bypas graft femoral popliteal and R common femoral endarterectomy. Pt did well during the surgery and she was kept intubated and on mechanical ventilation and PCCM service was consulted for vent management. Of note, pt was found to have UA with bacteria and was started on Cipro.  Lines / Drains: RIJ 5/3>>> HD catheter >>> ETT 5/4>>>5/4  Cultures: Urine 5/5>>> BCx3 5/4>>>  Antibiotics: Cipro 5/3>>>5/4 Zosyn 5/4>>>    Subjective/overnight:  Extubated 5/4.  C/o abd pain, leg pain. Denies SOB.  Lactate, pct elevated overnight but hemodynamics ok.   Vital Signs: Temp:  [97.4 F (36.3 C)-98.2 F (36.8 C)] 98.1 F (36.7 C) (05/05 0736) Pulse Rate:  [37-118] 98  (05/05 0600) Resp:  [12-31] 20  (05/05 0600) BP: (87-135)/(38-91) 133/63 mmHg (05/05 0600) SpO2:  [89 %-100 %] 97 % (05/05 0600) Arterial Line BP: (110-139)/(50-58) 110/56 mmHg (05/04 1100) FiO2 (%):  [40 %-41.2 %] 41 % (05/04 1100)   Intake/Output Summary (Last 24 hours) at 12/30/11 0801 Last data filed at 12/30/11 0636  Gross per 24 hour  Intake 1027.34 ml  Output   1700 ml  Net -672.66 ml    Physical Examination: General: chronically ill appearing, NAD Neuro: drowsy but easily arousable, follows commands, appropriate HEENT:  PERRL, pink conjunctivae, moist membranes Neck:  Supple, no JVD   Cardiovascular:  RRR, no M/R/G Lungs:  Mildly decreased breath sounds bases with no crackles, rhonchi or wheezes. Abdomen:  Soft, nontender, nondistended, bowel sounds present. Abdominal wound with no erythema, bleeding or discharge. Musculoskeletal:  Low extrem edema 1+, leg wound clean. Skin:  No rash  Labs and Imaging:  Reviewed.  Please refer to  the Assessment and Plan section for relevant results.  Dg Chest Portable 1 View  12/29/2011  *RADIOLOGY REPORT*  Clinical Data: Status post thoracic surgery.  PORTABLE CHEST - 1 VIEW  Comparison: 12/28/2011.  Findings: The support apparatus is stable.  No complicating features.  The lungs demonstrate improved aeration.  A small right effusion persists.  IMPRESSION:  1.  Stable support apparatus. 2.  Small right pleural effusion and minimal streaky basilar atelectasis.  Original Report Authenticated By: P. Loralie Champagne, M.D.   Dg Chest Portable 1 View  12/28/2011  *RADIOLOGY REPORT*  Clinical Data: 41 year old female status post line placement.  PORTABLE CHEST - 1 VIEW  Comparison: 12/24/2011 and earlier.  Findings: AP portable semi upright view 1715 hours. Endotracheal tube tip below the clavicles.  Enteric tube placed, courses to the abdomen and tip not included.  Right IJ approach Swan-Ganz catheter placed, tip at the level of the main pulmonary artery.  Stable preexisting left IJ dual lumen catheter.  Stable left axillary metal vascular stent.  Slightly lower lung volumes.  Mild increased perihilar crowding.  No pneumothorax, pulmonary edema or confluent opacity.  Probable continued small right effusion.  IMPRESSION: 1.  Lines and tubes appear appropriately placed with positions detailed above. No pneumothorax. 2.  Continued small right effusion.  Mild increased perihilar atelectasis.  Original Report Authenticated By: Harley Hallmark, M.D.     ASSESSMENT AND PLAN  NEUROLOGIC A:  Awake, follows commands P: --off sedation post extubation   PULMONARY A:  Acute resp failure --  Pt kept intubated after abdominal/femoral surgery. P: --> extubated 5/4, tol well  -->  pulm hygiene  -->  mobilixe when ok with VVS -->  Follow up ABG -->  Follow up CXR -->  Bronchodilators  Cardiology A: transient hypotension/tachycardia 5/4, ?volume depletion after HD.  Resolved with bolus.  P: - Hx of CAD,  sever peripheral vasc disease, HTN - BP stable  RENAL  Lab 12/30/11 0329 12/29/11 0425 12/28/11 2318 12/28/11 1715 12/28/11 0601 12/24/11 1538  NA 139 136 135 134* 132* --  K 3.3* 4.2 -- -- -- --  CL 98 98 -- 97 -- 92*  CO2 26 19 -- 20 -- 17*  BUN 24* 42* -- 37* -- 46*  CREATININE 3.49* 5.22* -- 4.63* -- 5.82*  CALCIUM 9.1 8.8 -- 8.3* -- 9.8  MG -- -- -- -- -- --  PHOS 6.5* -- -- -- -- --  A:  ESRD on HD P: -->  BMP, Mg, Phos in AM -->  HD per renal   HEMATOLOGIC Lab 12/29/11 0425 12/28/11 2318 12/28/11 1730 12/28/11 1715 12/28/11 0601 12/24/11 1538  HGB 9.9* 9.9* -- 10.0* 10.2* 10.9*  HCT 29.4* 29.0* -- 30.0* 30.0* 32.6*  PLT 237 -- -- 237 -- 315  INR -- -- 1.14 -- -- 1.07  APTT -- -- 25 -- -- 34   A:  Anemia P: -->  CBC in AM - No signs of acute bleeding --> transfuse for hgb<7  INFECTIOUS Lab 12/29/11 1715 12/29/11 0425 12/28/11 1715 12/24/11 1538  WBC -- 13.1* 12.9* 13.1*  PROCALCITON 0.89 -- -- --   A:  UTI, pct and lactate elevated.  BP ok.  P: -->  Urine culture pending -->  Broaden Cipro 5/5 to Zosyn with recent resistant EColi UTI  -->  recheck urine culture -->  Hold on EGDT  ENDOCRINE  Lab 12/30/11 0734 12/30/11 0401 12/29/11 2334 12/29/11 1941 12/29/11 1846  GLUCAP 147* 154* 103* 109* 128*   A:  DM P: -->  ICU hyperglycemia protocol  BEST PRACTICE / DISPOSITION -->  ICU status under vascular  -->  Full code -->  NPO -->  On Lovenox  -->  Protonix IV for GI Px   WHITEHEART,KATHRYN, NP 12/30/2011  7:57 AM Pager: [phone]  *Care during the described time interval was provided by me and/or other providers on the critical care team. I have reviewed this patient's available data, including medical history, events of note, physical examination and test results as part of my evaluation.  Patient seen and examined, agree with above note.  I dictated the care and orders written for this patient under my direction.  Koren Bound,  M.D. [phone]

## 2011-12-30 NOTE — Progress Notes (Signed)
PT Cancellation Note  Treatment cancelled today due to medical issues with patient which prohibited therapy.  Awaiting ok from VVS (per PCCM note) to initiate mobility evaluation and establish therapy POC and recommendations for d/c.   Narda Amber [facility] 12/30/2011, 10:03 AM

## 2011-12-30 NOTE — Progress Notes (Signed)
[facility]  Daily Progress Note  Assessment/Planning: POD #2 s/p Aobifemoral BPG, R fem-pop   Keep in ICU another 24 hr based on concerns documented in PCCM's notes  IV abx adjusted due to drug resist EColi in urine cx   Though early BM, I don't see anything on clinical exam to suggest some underlying bowel ischemia  Keep NPO as NGT output still too high though some limited resumption of bowel function  Subjective  - 2 Days Post-Op  Extubated with some resp difficulties overnight, C/o pain, some flatus and 1 BM  Objective Filed Vitals:   12/30/11 0403 12/30/11 0500 12/30/11 0600 12/30/11 0736  BP:  116/61 133/63   Pulse:  94 98   Temp: 97.8 F (36.6 C)   98.1 F (36.7 C)  TempSrc: Oral   Oral  Resp:  15 20   Height:      Weight:      SpO2:  96% 97%     Intake/Output Summary (Last 24 hours) at 12/30/11 0802 Last data filed at 12/30/11 0636  Gross per 24 hour  Intake 1027.34 ml  Output   1700 ml  Net -672.66 ml   PULM  BLL rales CV  RRR GI  soft, appropriate TTP, mildly distended, -G/R, inc c/d/i VASC  B groin inc c/d/i, dopplerable pedal signals in B feet  Laboratory CBC    Component Value Date/Time   WBC 13.1* 12/29/2011 0425   HGB 9.9* 12/29/2011 0425   HCT 29.4* 12/29/2011 0425   PLT 237 12/29/2011 0425    BMET    Component Value Date/Time   NA 139 12/30/2011 0329   K 3.3* 12/30/2011 0329   CL 98 12/30/2011 0329   CO2 26 12/30/2011 0329   GLUCOSE 169* 12/30/2011 0329   BUN 24* 12/30/2011 0329   CREATININE 3.49* 12/30/2011 0329   CREATININE 1.78* 10/17/2008 2000   CALCIUM 9.1 12/30/2011 0329   CALCIUM 8.3* 08/21/2011 0533   GFRNONAA 15* 12/30/2011 0329   GFRAA 18* 12/30/2011 0329   Urine Cx: E. Coli with multidrug resistance  Leonides Sake, MD [facility] Office: [phone] Pager: [phone]  12/30/2011, 8:02 AM

## 2011-12-30 NOTE — Progress Notes (Signed)
S: extubated  Some abd pain sec surgery O:BP 133/63  Pulse 98  Temp(Src) 98.1 F (36.7 C) (Oral)  Resp 20  Ht 5\' 9"  (1.753 m)  Wt 87 kg (191 lb 12.8 oz)  BMI 28.32 kg/m2  SpO2 97%  Intake/Output Summary (Last 24 hours) at 12/30/11 0849 Last data filed at 12/30/11 0636  Gross per 24 hour  Intake 1027.34 ml  Output   1700 ml  Net -672.66 ml   Weight change:  Gen: awake and alert CVS:RRR Resp:Decreased BS bases Abd: No BS, soft tender around wounds Ext:no edema NEURO:Ox3 CNI Lt IJ perm cath      . darbepoetin      . darbepoetin  25 mcg Intravenous Q Sat-HD  . docusate sodium  100 mg Oral Daily  . enoxaparin  30 mg Subcutaneous Q24H  . homatropine  1 drop Right Eye TID  . insulin aspart  0-4 Units Subcutaneous Q4H  . insulin glargine  10 Units Subcutaneous Q24H  . loteprednol  1 drop Right Eye QID  . metoCLOPramide (REGLAN) injection  5 mg Intravenous Q6H  . metoprolol  5 mg Intravenous Q6H  . multivitamin  1 tablet Oral QHS  . nicotine  21 mg Transdermal Q24H  . ofloxacin  1 drop Right Eye TID  . pantoprazole (PROTONIX) IV  40 mg Intravenous Q24H  . piperacillin-tazobactam (ZOSYN)  IV  2.25 g Intravenous Q8H  . sodium chloride  500 mL Intravenous Once  . Travoprost (BAK Free)  1 drop Right Eye QHS  . DISCONTD: antiseptic oral rinse  15 mL Mouth Rinse QID  . DISCONTD: chlorhexidine  15 mL Mouth Rinse BID  . DISCONTD: ciprofloxacin  400 mg Intravenous Q24H  . DISCONTD: insulin aspart  0-3 Units Subcutaneous Q4H  . DISCONTD: insulin aspart  0-9 Units Subcutaneous Q4H   Dg Chest Portable 1 View  12/29/2011  *RADIOLOGY REPORT*  Clinical Data: Status post thoracic surgery.  PORTABLE CHEST - 1 VIEW  Comparison: 12/28/2011.  Findings: The support apparatus is stable.  No complicating features.  The lungs demonstrate improved aeration.  A small right effusion persists.  IMPRESSION:  1.  Stable support apparatus. 2.  Small right pleural effusion and minimal streaky basilar  atelectasis.  Original Report Authenticated By: P. Loralie Champagne, M.D.   Dg Chest Portable 1 View  12/28/2011  *RADIOLOGY REPORT*  Clinical Data: 41 year old female status post line placement.  PORTABLE CHEST - 1 VIEW  Comparison: 12/24/2011 and earlier.  Findings: AP portable semi upright view 1715 hours. Endotracheal tube tip below the clavicles.  Enteric tube placed, courses to the abdomen and tip not included.  Right IJ approach Swan-Ganz catheter placed, tip at the level of the main pulmonary artery.  Stable preexisting left IJ dual lumen catheter.  Stable left axillary metal vascular stent.  Slightly lower lung volumes.  Mild increased perihilar crowding.  No pneumothorax, pulmonary edema or confluent opacity.  Probable continued small right effusion.  IMPRESSION: 1.  Lines and tubes appear appropriately placed with positions detailed above. No pneumothorax. 2.  Continued small right effusion.  Mild increased perihilar atelectasis.  Original Report Authenticated By: Harley Hallmark, M.D.   BMET    Component Value Date/Time   NA 139 12/30/2011 0329   K 3.3* 12/30/2011 0329   CL 98 12/30/2011 0329   CO2 26 12/30/2011 0329   GLUCOSE 169* 12/30/2011 0329   BUN 24* 12/30/2011 0329   CREATININE 3.49* 12/30/2011 0329   CREATININE 1.78* 10/17/2008  2000   CALCIUM 9.1 12/30/2011 0329   CALCIUM 8.3* 08/21/2011 0533   GFRNONAA 15* 12/30/2011 0329   GFRAA 18* 12/30/2011 0329   CBC    Component Value Date/Time   WBC 13.1* 12/29/2011 0425   RBC 3.13* 12/29/2011 0425   HGB 9.9* 12/29/2011 0425   HCT 29.4* 12/29/2011 0425   PLT 237 12/29/2011 0425   MCV 93.9 12/29/2011 0425   MCH 31.6 12/29/2011 0425   MCHC 33.7 12/29/2011 0425   RDW 18.2* 12/29/2011 0425   LYMPHSABS 0.8 10/30/2011 1848   MONOABS 0.3 10/30/2011 1848   EOSABS 0.0 10/30/2011 1848   BASOSABS 0.1 10/30/2011 1848     Assessmen 1. SP Aortobifem  2. amemia 3. Sec Hpth no requiring Vit D 4. DM 5. HTN 6. ESRD  TTS schedule  Plan: 1. HD on Tuesday 2.  Start PO binder  when taking PO 3. Recheck labs in am   Gibril Mastro T

## 2011-12-31 ENCOUNTER — Inpatient Hospital Stay (HOSPITAL_COMMUNITY): Payer: Medicare Other

## 2011-12-31 ENCOUNTER — Encounter (HOSPITAL_COMMUNITY): Payer: Self-pay | Admitting: Surgery

## 2011-12-31 DIAGNOSIS — N390 Urinary tract infection, site not specified: Secondary | ICD-10-CM

## 2011-12-31 DIAGNOSIS — E1169 Type 2 diabetes mellitus with other specified complication: Secondary | ICD-10-CM

## 2011-12-31 DIAGNOSIS — L97909 Non-pressure chronic ulcer of unspecified part of unspecified lower leg with unspecified severity: Secondary | ICD-10-CM

## 2011-12-31 LAB — RENAL FUNCTION PANEL
BUN: 41 mg/dL — ABNORMAL HIGH (ref 6–23)
Chloride: 97 mEq/L (ref 96–112)
GFR calc Af Amer: 11 mL/min — ABNORMAL LOW (ref >90)
Glucose, Bld: 190 mg/dL — ABNORMAL HIGH (ref 70–99)
Potassium: 3.4 mEq/L — ABNORMAL LOW (ref 3.5–5.1)
Sodium: 140 mEq/L (ref 135–145)

## 2011-12-31 LAB — POCT I-STAT 7, (LYTES, BLD GAS, ICA,H+H)
Acid-base deficit: 5 mmol/L — ABNORMAL HIGH (ref 0.0–2.0)
Bicarbonate: 20.5 mEq/L (ref 20.0–24.0)
Bicarbonate: 21 mEq/L (ref 20.0–24.0)
Calcium, Ion: 1.12 mmol/L (ref 1.12–1.32)
Hemoglobin: 9.9 g/dL — ABNORMAL LOW (ref 12.0–15.0)
Hemoglobin: 9.2 g/dL — ABNORMAL LOW (ref 12.0–15.0)
Hemoglobin: 10.9 g/dL — ABNORMAL LOW (ref 12.0–15.0)
O2 Saturation: 100 %
O2 Saturation: 99 %
O2 Saturation: 99 %
Patient temperature: 35.9
Potassium: 3.9 mEq/L (ref 3.5–5.1)
Potassium: 3.5 mEq/L (ref 3.5–5.1)
Potassium: 4 mEq/L (ref 3.5–5.1)
Sodium: 135 mEq/L (ref 135–145)
TCO2: 22 mmol/L (ref 0–100)
TCO2: 22 mmol/L (ref 0–100)
TCO2: 22 mmol/L (ref 0–100)
pCO2 arterial: 42.7 mmHg (ref 35.0–45.0)
pCO2 arterial: 40 mmHg (ref 35.0–45.0)
pH, Arterial: 7.283 — ABNORMAL LOW (ref 7.350–7.400)
pH, Arterial: 7.341 — ABNORMAL LOW (ref 7.350–7.400)
pH, Arterial: 7.315 — ABNORMAL LOW (ref 7.350–7.400)
pO2, Arterial: 159 mmHg — ABNORMAL HIGH (ref 80.0–100.0)

## 2011-12-31 LAB — CBC
MCH: 30.5 pg (ref 26.0–34.0)
MCV: 95.8 fL (ref 78.0–100.0)
Platelets: 156 10*3/uL (ref 150–400)
RBC: 2.59 MIL/uL — ABNORMAL LOW (ref 3.87–5.11)
RDW: 17.9 % — ABNORMAL HIGH (ref 11.5–15.5)
WBC: 11.8 10*3/uL — ABNORMAL HIGH (ref 4.0–10.5)

## 2011-12-31 LAB — GLUCOSE, CAPILLARY
Glucose-Capillary: 167 mg/dL — ABNORMAL HIGH (ref 70–99)
Glucose-Capillary: 178 mg/dL — ABNORMAL HIGH (ref 70–99)
Glucose-Capillary: 195 mg/dL — ABNORMAL HIGH (ref 70–99)

## 2011-12-31 LAB — POCT I-STAT GLUCOSE
Glucose, Bld: 190 mg/dL — ABNORMAL HIGH (ref 70–99)
Operator id: 305741
Operator id: 117071

## 2011-12-31 MED ORDER — DIPHENHYDRAMINE HCL 50 MG/ML IJ SOLN: 12.5000 mg | Freq: Four times a day (QID) | INTRAMUSCULAR | Status: DC | PRN

## 2011-12-31 MED ORDER — SODIUM CHLORIDE 0.9 % IV SOLN: INTRAVENOUS | Status: DC

## 2011-12-31 MED ORDER — HEPARIN SODIUM (PORCINE) 1000 UNIT/ML DIALYSIS
100.0000 [IU]/kg | INTRAMUSCULAR | Status: DC | PRN
  Administered 2012-01-01: 8700 [IU] via INTRAVENOUS_CENTRAL
  Filled 2011-12-31: qty 9

## 2011-12-31 MED ORDER — INSULIN ASPART 100 UNIT/ML ~~LOC~~ SOLN: 0.0000 [IU] | Freq: Three times a day (TID) | SUBCUTANEOUS | Status: DC

## 2011-12-31 MED ORDER — ONDANSETRON HCL 4 MG/2ML IJ SOLN
4.0000 mg | Freq: Four times a day (QID) | INTRAMUSCULAR | Status: DC | PRN
  Administered 2012-01-03 – 2012-01-04 (×3): 4 mg via INTRAVENOUS
  Filled 2011-12-31 (×2): qty 2

## 2011-12-31 MED ORDER — NALOXONE HCL 0.4 MG/ML IJ SOLN: 0.4000 mg | INTRAMUSCULAR | Status: DC | PRN

## 2011-12-31 MED ORDER — DARBEPOETIN ALFA-POLYSORBATE 25 MCG/0.42ML IJ SOLN
200.0000 ug | INTRAMUSCULAR | Status: DC
  Filled 2011-12-31: qty 3.36

## 2011-12-31 MED ORDER — INSULIN ASPART 100 UNIT/ML ~~LOC~~ SOLN
0.0000 [IU] | SUBCUTANEOUS | Status: DC
  Administered 2011-12-31 (×2): 2 [IU] via SUBCUTANEOUS
  Administered 2012-01-01 (×2): 1 [IU] via SUBCUTANEOUS
  Administered 2012-01-01 (×2): 2 [IU] via SUBCUTANEOUS
  Administered 2012-01-02: 9 [IU] via SUBCUTANEOUS
  Administered 2012-01-02 (×2): 3 [IU] via SUBCUTANEOUS
  Administered 2012-01-02: 1 [IU] via SUBCUTANEOUS
  Administered 2012-01-02: 2 [IU] via SUBCUTANEOUS
  Administered 2012-01-03: 3 [IU] via SUBCUTANEOUS
  Administered 2012-01-03 (×2): 7 [IU] via SUBCUTANEOUS
  Administered 2012-01-03: 9 [IU] via SUBCUTANEOUS
  Administered 2012-01-04: 5 [IU] via SUBCUTANEOUS
  Administered 2012-01-04: 2 [IU] via SUBCUTANEOUS
  Administered 2012-01-04 (×2): 5 [IU] via SUBCUTANEOUS
  Administered 2012-01-04: 1 [IU] via SUBCUTANEOUS
  Administered 2012-01-04: 5 [IU] via SUBCUTANEOUS
  Administered 2012-01-05: 3 [IU] via SUBCUTANEOUS
  Administered 2012-01-05: 9 [IU] via SUBCUTANEOUS

## 2011-12-31 MED ORDER — COLLAGENASE 250 UNIT/GM EX OINT
1.0000 "application " | TOPICAL_OINTMENT | Freq: Every day | CUTANEOUS | Status: DC
  Administered 2011-12-31 – 2012-01-05 (×5): 1 via TOPICAL
  Filled 2011-12-31 (×2): qty 30

## 2011-12-31 MED ORDER — SODIUM CHLORIDE 0.9 % IJ SOLN: 9.0000 mL | INTRAMUSCULAR | Status: DC | PRN

## 2011-12-31 MED ORDER — DIPHENHYDRAMINE HCL 12.5 MG/5ML PO ELIX
12.5000 mg | ORAL_SOLUTION | Freq: Four times a day (QID) | ORAL | Status: DC | PRN
  Filled 2011-12-31: qty 5

## 2011-12-31 MED ORDER — SODIUM CHLORIDE 0.9 % IJ SOLN
INTRAMUSCULAR | Status: AC
  Administered 2011-12-31: 10 mL
  Filled 2011-12-31: qty 10

## 2011-12-31 MED ORDER — HYDROMORPHONE 0.3 MG/ML IV SOLN
INTRAVENOUS | Status: DC
  Administered 2011-12-31: 3.35 mg via INTRAVENOUS
  Administered 2011-12-31: 1.79 mg via INTRAVENOUS
  Administered 2011-12-31: 1.59 mg via INTRAVENOUS
  Administered 2011-12-31: 09:00:00 via INTRAVENOUS
  Administered 2011-12-31: 2.59 mg via INTRAVENOUS
  Administered 2012-01-01: 1.39 mg via INTRAVENOUS
  Administered 2012-01-01: 1.5 mg via INTRAVENOUS
  Filled 2011-12-31 (×2): qty 25

## 2011-12-31 NOTE — Progress Notes (Addendum)
CBGs so far today: 178/ 144/ 195/ 167 mg/dl  Patient refused Lantus 10 units at midnight last pm.  Also refused Novolog SSI coverage at midnight and 4am.  Home insulin regimen includes:  Lantus 30 units QHS Humalog SSI: 200-249 7 units 250-299 10 units 300-349 12 units >350      15 units  So far, patient's CBGs stable, even though patient refusing insulin at times.  Recommend the following:  Change current SSI to Moderate scale Q4 hours per Glycemic control order set (currently on ICU Hyperglycemia Protocol SSI and patient now on step-down unit).  This order can be found under "order sets" of order management tab.  Will follow and assess/assist daily. Ambrose Finland RN, MSN, CDE Diabetes Coordinator Inpatient Diabetes Program [phone]

## 2011-12-31 NOTE — Progress Notes (Addendum)
[facility] [place]  Post-op  Intra-abdominal Surgery note  Date of Surgery: 12/28/2011 Surgeon: Surgeon(s): Nada Libman, MD POD: 3 Days Post-Op Procedure(s): AORTA BIFEMORAL BYPASS GRAFT Right BYPASS GRAFT FEMORAL-POPLITEAL ARTERY  History of Present Illness  [name] is a 41 y.o. female who is  3 days post-op Pt is doing well except complains of incisional pain; denies nausea/vomiting; denies diarrhea. has had flatus;has had BM  IMAGING: No results found.  Significant Diagnostic Studies: CBC Lab Results  Component Value Date   WBC 11.8* 12/31/2011   HGB 7.9* 12/31/2011   HCT 24.8* 12/31/2011   MCV 95.8 12/31/2011   PLT 156 12/31/2011    BMET    Component Value Date/Time   NA 140 12/31/2011 0400   K 3.4* 12/31/2011 0400   CL 97 12/31/2011 0400   CO2 28 12/31/2011 0400   GLUCOSE 190* 12/31/2011 0400   BUN 41* 12/31/2011 0400   CREATININE 5.04* 12/31/2011 0400   CREATININE 1.78* 10/17/2008 2000   CALCIUM 9.9 12/31/2011 0400   CALCIUM 8.3* 08/21/2011 0533   GFRNONAA 10* 12/31/2011 0400   GFRAA 11* 12/31/2011 0400    COAG Lab Results  Component Value Date   INR 1.14 12/28/2011   INR 1.07 12/24/2011   INR 0.96 12/20/2011   No results found for this basename: PTT    I/O last 3 completed shifts: In: 1126 [I.V.:680; NG/GT:180; IV Piggyback:266] Out: 2635 [Urine:35; Emesis/NG output:2600]    Physical Examination BP Readings from Last 3 Encounters:  12/31/11 108/45  12/31/11 108/45  12/24/11 120/88   Temp Readings from Last 3 Encounters:  12/31/11 98.1 F (36.7 C) Oral  12/31/11 98.1 F (36.7 C) Oral  12/24/11 98.2 F (36.8 C) Oral   SpO2 Readings from Last 3 Encounters:  12/31/11 90%  12/31/11 90%  12/24/11 97%   Pulse Readings from Last 3 Encounters:  12/31/11 86  12/31/11 86  12/24/11 100    General: A&O x 3, WDWN female in NAD Pulmonary: normal non-labored breathing , without Rales, rhonchi,  wheezing Cardiac: Heart rate : regular ,    Abdomen:abdomen soft, non-tender and Quiet  Abdominal wound:clean, dry, intact  Neurologic: A&O X 3; Appropriate Affect ; SENSATION: normal; MOTOR FUNCTION:  moving all extremities equally. Speech is fluent/normal  Vascular Exam:BLE warm and well perfused Extremities without ischemic changes, no Gangrene , no cellulitis; Right leg wounds healing well with good granulation tissue  LOWER EXTREMITY PULSES           RIGHT                                      LEFT      POSTERIOR TIBIAL doppler doppler       DORSALIS PEDIS      ANTERIOR TIBIAL doppler doppler   Assessment/Plan: [name] is a 41 y.o. female who is 3 Days Post-Op Procedure(s): AORTA BIFEMORAL BYPASS GRAFT BYPASS GRAFT FEMORAL-POPLITEAL ARTERY UTI - Cont ABX NGT out put > 1L in 24 hours; 1 BM Post-op acute blood loss on chronic anemia - PLT down; H/H 7.9 without obvious bleeding source - may be dilutional ESRD - HD tomorrow Pain not well controlled - may start PCA Open wounds right leg - improving - cont dressing changes Transfer to 3300 if ok with CCM      ROCZNIAK,REGINA J [phone] 12/31/2011 7:40 AM   Agree  with above Activity as tolerated tx 3300  Wells Lillis Nuttle

## 2011-12-31 NOTE — Progress Notes (Signed)
UR COMPLETED  

## 2011-12-31 NOTE — Progress Notes (Signed)
Name: [name] MRN: [id number] DOB: December 19, 1970    LOS: 3  PCCM ADMISSION NOTE   History of Present Illness: 41 y/o F with PMHx of severe peripheral vasc disease, DM, HTN, ESRD on HD, CAD, who was admitted to [facility] for vascular surgery and aorta bifemoral bypassgraft, R bypas graft femoral popliteal and R common femoral endarterectomy. Pt did well during the surgery and she was kept intubated and on mechanical ventilation and PCCM service was consulted for vent management. Of note, pt was found to have UA with bacteria and was started on Cipro.  Lines / Drains: RIJ 5/3>>> HD catheter >>> ETT 5/4>>>5/4  Cultures: Urine 5/5>>> BCx3 5/4>>>  Antibiotics: Cipro 5/3>>>5/4 Zosyn 5/4>>>    Subjective/overnight:  No longer hypotensive Received HD Saturday  Vital Signs: Temp:  [97.3 F (36.3 C)-98.1 F (36.7 C)] 97.3 F (36.3 C) (05/06 0749) Pulse Rate:  [86-102] 89  (05/06 0800) Resp:  [13-24] 19  (05/06 0800) BP: (102-165)/(42-104) 142/57 mmHg (05/06 0800) SpO2:  [90 %-100 %] 92 % (05/06 0800) Weight:  [87.2 kg (192 lb 3.9 oz)] 87.2 kg (192 lb 3.9 oz) (05/06 0400)   Intake/Output Summary (Last 24 hours) at 12/31/11 0900 Last data filed at 12/31/11 0700  Gross per 24 hour  Intake    776 ml  Output   1335 ml  Net   -559 ml    Physical Examination: General: chronically ill appearing, NAD, up in chair Neuro:  follows commands, appropriate HEENT:  PERRL, pink conjunctivae, moist membranes Neck:  Supple, no JVD   Cardiovascular:  RRR, no M/R/G Lungs:  Mildly decreased breath sounds bases with no crackles, rhonchi or wheezes. Abdomen:  Soft, nontender, nondistended, bowel sounds present. Abdominal wound with no erythema, bleeding or discharge. Musculoskeletal:  Low extrem edema 1+, leg wound clean. Skin:  No rash  Labs and Imaging:  Reviewed.  Please refer to the Assessment and Plan section for relevant results.  Dg Chest Portable 1 View  12/31/2011  *RADIOLOGY REPORT*   Clinical Data: Congestion  PORTABLE CHEST - 1 VIEW  Comparison: Chest radiograph 12/29/2011  Findings: Removal of endotracheal tube.  Swan-Ganz catheters been retracted.  Right IJ sheath remains. Left central venous line is unchanged.  Stable cardiac silhouette. Increased left lower lobe atelectasis.  No pulmonary edema.  No pneumothorax.  IMPRESSION:  1.  Increased left lower lobe atelectasis following extubation. 2.  No pulmonary edema or pneumothorax.  This was made a call report.  Original Report Authenticated By: Genevive Bi, M.D.     ASSESSMENT AND PLAN  PULMONARY A:  Acute resp failure -- resolved, extubated 5/4 P: -->  pulm hygiene  -->  mobilixe when ok with VVS -->  Bronchodilators as ordered; will need to address tobacco cessation and long-term BD's for discharge  Cardiology A:  Hx of CAD, severe peripheral vasc disease, HTN transient hypotension/tachycardia 5/4, ?volume depletion after HD.  Resolved with bolus.  P: - BP stable  RENAL  Lab 12/31/11 0400 12/30/11 0329 12/29/11 0425 12/28/11 2318 12/28/11 1715 12/24/11 1538  NA 140 139 136 135 134* --  K 3.4* 3.3* -- -- -- --  CL 97 98 98 -- 97 92*  CO2 28 26 19  -- 20 17*  BUN 41* 24* 42* -- 37* 46*  CREATININE 5.04* 3.49* 5.22* -- 4.63* 5.82*  CALCIUM 9.9 9.1 8.8 -- 8.3* 9.8  MG -- -- -- -- -- --  PHOS 9.4* 6.5* -- -- -- --  A:  ESRD  on HD P: -->  HD per renal   HEMATOLOGIC  Lab 12/31/11 0400 12/29/11 0425 12/28/11 2318 12/28/11 1730 12/28/11 1715 12/28/11 0601 12/24/11 1538  HGB 7.9* 9.9* 9.9* -- 10.0* 10.2* --  HCT 24.8* 29.4* 29.0* -- 30.0* 30.0* --  PLT 156 237 -- -- 237 -- 315  INR -- -- -- 1.14 -- -- 1.07  APTT -- -- -- 25 -- -- 34   A:  Anemia No signs of acute bleeding P: --> transfuse for hgb<7  INFECTIOUS  Lab 12/31/11 0400 12/29/11 1715 12/29/11 0425 12/28/11 1715 12/24/11 1538  WBC 11.8* -- 13.1* 12.9* 13.1*  PROCALCITON -- 0.89 -- -- --   A:  UTI, pct and lactate elevated.  BP ok.   P: -->  Broadened Cipro 5/5 to Zosyn with recent resistant EColi UTI  -->  recheck urine culture and narrow based on results  ENDOCRINE  Lab 12/31/11 0747 12/30/11 2343 12/30/11 1933 12/30/11 1604 12/30/11 1140  GLUCAP 195* 178* 151* 186* 207*   A:  DM P: -->  ICU hyperglycemia protocol  BEST PRACTICE / DISPOSITION -->  agree with transfer to floor status under vascular  -->  Full code  PCCM will sign off. Please call if we can assist you  Levy Pupa, MD, PhD 12/31/2011, 9:10 AM [facility] [phone] or if no answer [phone]

## 2011-12-31 NOTE — Progress Notes (Signed)
eLink Physician-Brief Progress Note Patient Name: [name] DOB: Jan 30, 1971 MRN: [id number]  Date of Service  12/31/2011   HPI/Events of Note   ON ICU Hyperglycemia protocol in sdu  eICU Interventions  Change to non pregnant sliding scale   Intervention Category Intermediate Interventions: Other:  Kip Kautzman 12/31/2011, 8:18 PM

## 2011-12-31 NOTE — Evaluation (Signed)
Occupational Therapy Evaluation Patient Details Name: [name] MRN: [id number] DOB: [birth year] Today's Date: 12/31/2011 Time: 5621-3086 OT Time Calculation (min): 22 min  OT Assessment / Plan / Recommendation Clinical Impression  Pt admitted for aorta-bifemoral bypass with bilateral profundoplasty, right common femoral endarterectomy and right fem-pop. Pt presents with below problem list and will benefit from skilled OT in the acute setting to maximize I with ADL and ADL mobility prior to d/c home with mother.    OT Assessment  Patient needs continued OT Services    Follow Up Recommendations  Home health OT    Equipment Recommendations  None recommended by PT    Frequency Min 2X/week    Precautions / Restrictions Precautions Precautions: Fall Restrictions Weight Bearing Restrictions: No   Pertinent Vitals/Pain Pt c/o 10/10 RLE and stomach pain. Repositioned, RN made aware    ADL  Eating/Feeding: Performed;Set up ((ice chips)) Where Assessed - Eating/Feeding: Chair Grooming: Performed;Wash/dry face;Set up Where Assessed - Grooming: Supported sitting Upper Body Bathing: Simulated;Minimal assistance Where Assessed - Upper Body Bathing: Sitting, chair Lower Body Bathing: Simulated;Moderate assistance Where Assessed - Lower Body Bathing: Sitting, chair;Sit to stand from chair Upper Body Dressing: Performed;Minimal assistance Where Assessed - Upper Body Dressing: Sitting, bed Lower Body Dressing: Performed;+1 Total assistance (don socks) Where Assessed - Lower Body Dressing: Sitting, bed Toilet Transfer: Simulated;Minimal assistance Toilet Transfer Method: Ambulating Tub/Shower Transfer: Not assessed Equipment Used: Rolling walker Ambulation Related to ADLs: Min A with RW ambulation. +1 for lines and chair ADL Comments: Pt states she has not walked in at least the last 2 months. Doing well given this    OT Goals Acute Rehab OT Goals OT Goal Formulation: With  patient Time For Goal Achievement: 01/07/12 Potential to Achieve Goals: Good ADL Goals Pt Will Perform Grooming: with modified independence;Standing at sink ADL Goal: Grooming - Progress: Goal set today Pt Will Perform Lower Body Bathing: with supervision;Sit to stand from chair;Sit to stand in shower;Sit to stand from bed ADL Goal: Lower Body Bathing - Progress: Goal set today Pt Will Perform Lower Body Dressing: Independently;Sit to stand from chair;Sit to stand from bed;with modified independence ADL Goal: Lower Body Dressing - Progress: Goal set today Pt Will Transfer to Toilet: with modified independence;Ambulation;with DME ADL Goal: Toilet Transfer - Progress: Goal set today Pt Will Perform Toileting - Clothing Manipulation: Independently;Standing ADL Goal: Toileting - Clothing Manipulation - Progress: Goal set today Pt Will Perform Toileting - Hygiene: Independently;Sitting on 3-in-1 or toilet;Sit to stand from 3-in-1/toilet ADL Goal: Toileting - Hygiene - Progress: Goal set today Pt Will Perform Tub/Shower Transfer: Tub transfer;with supervision;Ambulation;Shower seat without back ADL Goal: Web designer - Progress: Goal set today  Visit Information  Last OT Received On: 12/31/11 Assistance Needed: +1 PT/OT Co-Evaluation/Treatment: Yes    Subjective Data  Subjective: [name] hurts Patient Stated Goal: Return home and to I level with ambulation and ADLs   Prior Functioning  Home Living Lives With: Family (mom) Available Help at Discharge: Family Type of Home: House Home Access: Ramped entrance Home Layout: One level Bathroom Shower/Tub: Engineer, manufacturing systems: Standard Bathroom Accessibility: Yes How Accessible: Accessible via wheelchair Home Adaptive Equipment: Shower chair with back;Walker - rolling;Wheelchair - manual;Straight cane;Raised toilet seat with rails;Other (comment) (lift chair) Prior Function Level of Independence: Needs assistance Needs  Assistance: Bathing;Dressing;Meal Prep;Light Housekeeping Bath: Moderate Dressing: Moderate Meal Prep: Total Light Housekeeping: Total Able to Take Stairs?: No Driving: No Vocation: On disability Comments: pt has not ambulated for  2 months Communication Communication: No difficulties Dominant Hand: Right    Cognition  Overall Cognitive Status: Appears within functional limits for tasks assessed/performed Arousal/Alertness: Awake/alert Orientation Level: Appears intact for tasks assessed Behavior During Session: [facility] for tasks performed    Extremity/Trunk Assessment Right Upper Extremity Assessment RUE ROM/Strength/Tone: Within functional levels RUE Sensation: WFL - Light Touch RUE Coordination: WFL - gross/fine motor Left Upper Extremity Assessment LUE ROM/Strength/Tone: Within functional levels LUE Sensation: WFL - Light Touch LUE Coordination: WFL - gross/fine motor Right Lower Extremity Assessment RLE ROM/Strength/Tone: Deficits;Due to pain RLE ROM/Strength/Tone Deficits: knee extension 2+/5 unable to resist movement due to pain Left Lower Extremity Assessment LLE ROM/Strength/Tone: Deficits;Due to pain LLE ROM/Strength/Tone Deficits: 3/5 grossly throughout, unable to resist due to surgical pain   Mobility Bed Mobility Bed Mobility: Left Sidelying to Sit;Sitting - Scoot to Edge of Bed Left Sidelying to Sit: 3: Mod assist;HOB flat Sitting - Scoot to Edge of Bed: 4: Min guard Details for Bed Mobility Assistance: cueing and assistance to elevate trunk from surface and bring bil LE off EOB Transfers Sit to Stand: 4: Min assist;From bed Stand to Sit: 4: Min assist;To chair/3-in-1;With armrests Details for Transfer Assistance: cueing for hand placement and sequence with transfer   Exercise General Exercises - Lower Extremity Long Arc Quad: AROM;Both;10 reps;Seated Hip Flexion/Marching: AROM;Both;10 reps;Seated      End of Session OT - End of Session Activity Tolerance:  Patient limited by pain Patient left: in chair;with call bell/phone within reach (MD in room) Nurse Communication: Mobility status   Kenetha Cozza 12/31/2011, 9:28 AM

## 2011-12-31 NOTE — Evaluation (Signed)
Physical Therapy Evaluation Patient Details Name: [name] MRN: [id number] DOB: [birth year] Today's Date: 12/31/2011 Time: 4098-1191 PT Time Calculation (min): 21 min  PT Assessment / Plan / Recommendation Clinical Impression  Pt admitted for aortobifem BPG and RLEfempop BPG with prolonged intubation after sx. Pt with progressive decline in function PTA and will benefit from acute therapy to increase transfers, mobility and functional status prior to discharge to decrease burden of care.    PT Assessment  Patient needs continued PT services    Follow Up Recommendations  Home health PT    Equipment Recommendations  None recommended by PT    Frequency Min 3X/week    Precautions / Restrictions Precautions Precautions: Fall Restrictions Weight Bearing Restrictions: No   Pertinent Vitals/Pain Pt reports 10/10 surgical pain, RN aware and awaiting PCA      Mobility  Bed Mobility Bed Mobility: Left Sidelying to Sit;Sitting - Scoot to Edge of Bed Left Sidelying to Sit: 3: Mod assist;HOB flat Sitting - Scoot to Edge of Bed: 4: Min guard Details for Bed Mobility Assistance: cueing and assistance to elevate trunk from surface and bring bil LE off EOB Transfers Transfers: Sit to Stand;Stand to Sit Sit to Stand: 4: Min assist;From bed Stand to Sit: 4: Min assist;To chair/3-in-1;With armrests Details for Transfer Assistance: cueing for hand placement and sequence with transfer Ambulation/Gait Ambulation/Gait Assistance: 4: Min assist Ambulation Distance (Feet): 5 Feet Assistive device: Rolling walker Ambulation/Gait Assistance Details: cueing for posture and sequence with encouragement to increase distance Gait Pattern: Decreased stride length;Step-through pattern;Trunk flexed Stairs: No    Exercises General Exercises - Lower Extremity Long Arc Quad: AROM;Both;10 reps;Seated Hip Flexion/Marching: AROM;Both;10 reps;Seated   PT Goals Acute Rehab PT Goals PT Goal  Formulation: With patient Time For Goal Achievement: 01/14/12 Potential to Achieve Goals: Good Pt will go Supine/Side to Sit: with supervision PT Goal: Supine/Side to Sit - Progress: Goal set today Pt will go Sit to Supine/Side: with supervision PT Goal: Sit to Supine/Side - Progress: Goal set today Pt will go Sit to Stand: with supervision PT Goal: Sit to Stand - Progress: Goal set today Pt will go Stand to Sit: with supervision PT Goal: Stand to Sit - Progress: Goal set today Pt will Ambulate: 16 - 50 feet;with least restrictive assistive device;with min assist PT Goal: Ambulate - Progress: Goal set today Pt will Perform Home Exercise Program: with supervision, verbal cues required/provided PT Goal: Perform Home Exercise Program - Progress: Goal set today  Visit Information  Last PT Received On: 12/31/11 Assistance Needed: +1 PT/OT Co-Evaluation/Treatment: Yes    Subjective Data  Subjective: i haven't been able to walk in 2 months Patient Stated Goal: be able to walk, bathe, and dress myself   Prior Functioning  Home Living Lives With: Family (mom) Available Help at Discharge: Family Type of Home: House Home Access: Ramped entrance Home Layout: One level Bathroom Shower/Tub: Engineer, manufacturing systems: Standard Bathroom Accessibility: Yes How Accessible: Accessible via wheelchair Home Adaptive Equipment: Shower chair with back;Walker - rolling;Wheelchair - manual;Straight cane;Raised toilet seat with rails;Other (comment) (lift chair) Prior Function Level of Independence: Needs assistance Needs Assistance: Bathing;Dressing;Meal Prep;Light Housekeeping Bath: Moderate Dressing: Moderate Meal Prep: Total Light Housekeeping: Total Able to Take Stairs?: No Driving: No Vocation: On disability Comments: pt has not ambulated for 2 months Communication Communication: No difficulties Dominant Hand: Right    Cognition  Overall Cognitive Status: Appears within  functional limits for tasks assessed/performed Arousal/Alertness: Awake/alert Orientation Level: Appears intact for  tasks assessed Behavior During Session: [facility] for tasks performed    Extremity/Trunk Assessment Right Upper Extremity Assessment RUE ROM/Strength/Tone: Within functional levels RUE Sensation: WFL - Light Touch RUE Coordination: WFL - gross/fine motor Left Upper Extremity Assessment LUE ROM/Strength/Tone: Within functional levels LUE Sensation: WFL - Light Touch LUE Coordination: WFL - gross/fine motor Right Lower Extremity Assessment RLE ROM/Strength/Tone: Deficits;Due to pain RLE ROM/Strength/Tone Deficits: knee extension 2+/5 unable to resist movement due to pain Left Lower Extremity Assessment LLE ROM/Strength/Tone: Deficits;Due to pain LLE ROM/Strength/Tone Deficits: 3/5 grossly throughout, unable to resist due to surgical pain   Balance    End of Session PT - End of Session Activity Tolerance: Patient tolerated treatment well Patient left: in chair;with call bell/phone within reach Nurse Communication: Mobility status   Delorse Lek 12/31/2011, 9:26 AM  Delaney Meigs, PT [phone]

## 2011-12-31 NOTE — Progress Notes (Signed)
Subjective: Interval History: has complaints Pain.  Objective: Vital signs in last 24 hours: Temp:  [97.3 F (36.3 C)-98.1 F (36.7 C)] 97.3 F (36.3 C) (05/06 0749) Pulse Rate:  [86-102] 90  (05/06 0900) Resp:  [13-24] 19  (05/06 0900) BP: (102-165)/(42-104) 138/64 mmHg (05/06 0900) SpO2:  [90 %-100 %] 92 % (05/06 0900) Weight:  [87.2 kg (192 lb 3.9 oz)] 87.2 kg (192 lb 3.9 oz) (05/06 0400) Weight change:   Intake/Output from previous day: 05/05 0701 - 05/06 0700 In: 806 [I.V.:460; NG/GT:180; IV Piggyback:166] Out: 1335 [Urine:35; Emesis/NG output:1300] Intake/Output this shift: Total I/O In: 110 [I.V.:40; NG/GT:70] Out: -   General appearance: cooperative, fatigued and moderately obese Head: R lid droop Resp: diminished breath sounds bilaterally Cardio: S1, S2 normal and systolic murmur: systolic ejection 2/6, decrescendo at 2nd left intercostal space GI: no BS, incision midline, tender Extremities: wrinkiling of LE skin, trophic change Skin: as above, sallow all over  Lab Results:  Basename 12/31/11 0400 12/29/11 0425  WBC 11.8* 13.1*  HGB 7.9* 9.9*  HCT 24.8* 29.4*  PLT 156 237   BMET:  Basename 12/31/11 0400 12/30/11 0329  NA 140 139  K 3.4* 3.3*  CL 97 98  CO2 28 26  GLUCOSE 190* 169*  BUN 41* 24*  CREATININE 5.04* 3.49*  CALCIUM 9.9 9.1   No results found for this basename: PTH:2 in the last 72 hours Iron Studies: No results found for this basename: IRON,TIBC,TRANSFERRIN,FERRITIN in the last 72 hours  Studies/Results: Dg Chest Portable 1 View  12/31/2011  *RADIOLOGY REPORT*  Clinical Data: Congestion  PORTABLE CHEST - 1 VIEW  Comparison: Chest radiograph 12/29/2011  Findings: Removal of endotracheal tube.  Swan-Ganz catheters been retracted.  Right IJ sheath remains. Left central venous line is unchanged.  Stable cardiac silhouette. Increased left lower lobe atelectasis.  No pulmonary edema.  No pneumothorax.  IMPRESSION:  1.  Increased left lower lobe  atelectasis following extubation. 2.  No pulmonary edema or pneumothorax.  This was made a call report.  Original Report Authenticated By: Genevive Bi, M.D.    I have reviewed the patient's current medications.  Assessment/Plan: 1 ESRD HD in am.  Vol ok 2 Anemia max EP and  Check Fe 3 PVD per VVS 4 DM fair control 5 ^ P NPO limits tx P HD EPO,, check Fe mobilize    LOS: 3 days   Kalim Kissel L 12/31/2011,10:02 AM   2

## 2012-01-01 ENCOUNTER — Inpatient Hospital Stay (HOSPITAL_COMMUNITY): Payer: Medicare Other

## 2012-01-01 LAB — TYPE AND SCREEN: ABO/RH(D): A POS

## 2012-01-01 LAB — COMPREHENSIVE METABOLIC PANEL
AST: 17 U/L (ref 0–37)
Alkaline Phosphatase: 139 U/L — ABNORMAL HIGH (ref 39–117)
BUN: 58 mg/dL — ABNORMAL HIGH (ref 6–23)
CO2: 23 mEq/L (ref 19–32)
Chloride: 93 mEq/L — ABNORMAL LOW (ref 96–112)
Creatinine, Ser: 6.57 mg/dL — ABNORMAL HIGH (ref 0.50–1.10)
GFR calc non Af Amer: 7 mL/min — ABNORMAL LOW (ref >90)
Total Bilirubin: 0.3 mg/dL (ref 0.3–1.2)

## 2012-01-01 LAB — CBC
HCT: 24.6 % — ABNORMAL LOW (ref 36.0–46.0)
MCV: 96.5 fL (ref 78.0–100.0)
Platelets: 178 10*3/uL (ref 150–400)
RBC: 2.55 MIL/uL — ABNORMAL LOW (ref 3.87–5.11)
WBC: 10.4 10*3/uL (ref 4.0–10.5)

## 2012-01-01 LAB — GLUCOSE, CAPILLARY
Glucose-Capillary: 154 mg/dL — ABNORMAL HIGH (ref 70–99)
Glucose-Capillary: 184 mg/dL — ABNORMAL HIGH (ref 70–99)

## 2012-01-01 MED ORDER — HYDROMORPHONE 0.3 MG/ML IV SOLN
INTRAVENOUS | Status: DC
  Administered 2012-01-01: 3.89 mg via INTRAVENOUS
  Administered 2012-01-01: 11:00:00 via INTRAVENOUS
  Administered 2012-01-01: 3.9 mg via INTRAVENOUS
  Administered 2012-01-01: 2.7 mg via INTRAVENOUS
  Administered 2012-01-02: 1.8 mg via INTRAVENOUS
  Administered 2012-01-02: 1.5 mg via INTRAVENOUS
  Administered 2012-01-02: 13:00:00 via INTRAVENOUS
  Administered 2012-01-02: 0.6 mg via INTRAVENOUS
  Administered 2012-01-02: 4.2 mg via INTRAVENOUS
  Administered 2012-01-03: 1.8 mg via INTRAVENOUS
  Filled 2012-01-01 (×3): qty 25

## 2012-01-01 MED ORDER — SODIUM CHLORIDE 0.9 % IJ SOLN
INTRAMUSCULAR | Status: AC
  Filled 2012-01-01: qty 10

## 2012-01-01 MED ORDER — HYDROMORPHONE 0.3 MG/ML IV SOLN: INTRAVENOUS | Status: DC

## 2012-01-01 MED FILL — Sodium Chloride IV Soln 0.9%: INTRAVENOUS | Qty: 1000 | Status: AC

## 2012-01-01 MED FILL — Heparin Sodium (Porcine) Inj 1000 Unit/ML: INTRAMUSCULAR | Qty: 30 | Status: AC

## 2012-01-01 MED FILL — Sodium Chloride Irrigation Soln 0.9%: Qty: 3000 | Status: AC

## 2012-01-01 NOTE — Procedures (Signed)
I was present at this session.  I have reviewed the session itself and made appropriate changes.  Cath flow 400.  bp 130s  [name] L 5/7/201311:44 AM

## 2012-01-01 NOTE — Progress Notes (Signed)
Hemodialysis- pt refused HD this am, will try and get later today. KHurtRN

## 2012-01-01 NOTE — Progress Notes (Signed)
PT Cancellation Note  Treatment cancelled today due to pt currently in HD.  Will follow up another time.    Sunny Schlein, [place] [phone] 01/01/2012, 1:59 PM

## 2012-01-01 NOTE — Progress Notes (Signed)
Subjective: Interval History: has complaints pain but less.  Objective: Vital signs in last 24 hours: Temp:  [97.2 F (36.2 C)-97.9 F (36.6 C)] 97.2 F (36.2 C) (05/07 0800) Pulse Rate:  [85-93] 87  (05/07 0800) Resp:  [13-22] 17  (05/07 1051) BP: (110-138)/(42-70) 110/42 mmHg (05/07 0800) SpO2:  [90 %-98 %] 91 % (05/07 1051) FiO2 (%):  [91 %] 91 % (05/07 0800) Weight:  [82 kg (180 lb 12.4 oz)] 82 kg (180 lb 12.4 oz) (05/07 0631) Weight change: -5.9 kg (-13 lb 0.1 oz)  Intake/Output from previous day: 05/06 0701 - 05/07 0700 In: 730 [I.V.:60; NG/GT:670] Out: 1550 [Emesis/NG output:1550] Intake/Output this shift: Total I/O In: -  Out: 100 [Emesis/NG output:100]  General appearance: alert, cooperative, pale and sallow complex Eyes: r lid droop Resp: diminished breath sounds bilaterally Cardio: regular rate and rhythm and systolic murmur: holosystolic 2/6, blowing at apex GI: pos bs, mild distension Extremities: ulcer R shin, and R foot Incision/Wound:clean  Lab Results:  Basename 12/31/11 0400  WBC 11.8*  HGB 7.9*  HCT 24.8*  PLT 156   BMET:  Basename 12/31/11 0400 12/30/11 0329  NA 140 139  K 3.4* 3.3*  CL 97 98  CO2 28 26  GLUCOSE 190* 169*  BUN 41* 24*  CREATININE 5.04* 3.49*  CALCIUM 9.9 9.1   No results found for this basename: PTH:2 in the last 72 hours Iron Studies: No results found for this basename: IRON,TIBC,TRANSFERRIN,FERRITIN in the last 72 hours  Studies/Results: Dg Chest Portable 1 View  12/31/2011  *RADIOLOGY REPORT*  Clinical Data: Congestion  PORTABLE CHEST - 1 VIEW  Comparison: Chest radiograph 12/29/2011  Findings: Removal of endotracheal tube.  Swan-Ganz catheters been retracted.  Right IJ sheath remains. Left central venous line is unchanged.  Stable cardiac silhouette. Increased left lower lobe atelectasis.  No pulmonary edema.  No pneumothorax.  IMPRESSION:  1.  Increased left lower lobe atelectasis following extubation. 2.  No pulmonary  edema or pneumothorax.  This was made a call report.  Original Report Authenticated By: Genevive Bi, M.D.    I have reviewed the patient's current medications.  Assessment/Plan: 1 ESRD for HD.  Cath ok.   2 Anemia low, check Fe 3 HPTh check 4 PVD per VS 5 DM fair control 6 CAD  P HD , check Fe, PTH. mobilize    LOS: 4 days   Edana Aguado L 01/01/2012,11:49 AM

## 2012-01-01 NOTE — Progress Notes (Addendum)
[facility] [place]  Post-op  Intra-abdominal Surgery note  Date of Surgery: 12/28/2011 Surgeon: Surgeon(s): Nada Libman, MD POD: 4 Days Post-Op Procedure(s): AORTA BIFEMORAL BYPASS GRAFT and RIGHT BYPASS GRAFT FEMORAL-POPLITEAL ARTERY  History of Present Illness  [name] is a 41 y.o. female who is  4 days post- op -still C/O pain on PCA dilaudid reduced dose. Has Had flatus and formed BM last PM. Pt is otherwise doing well.    I/O last 3 completed shifts: In: 1151 [I.V.:280; NG/GT:760; IV Piggyback:111] Out: 2285 [Urine:35; Emesis/NG output:2250] Total I/O In: -  Out: 100 [Emesis/NG output:100]  Physical Examination BP Readings from Last 3 Encounters:  01/01/12 110/42  01/01/12 110/42  12/24/11 120/88   Temp Readings from Last 3 Encounters:  01/01/12 97.2 F (36.2 C) Oral  01/01/12 97.2 F (36.2 C) Oral  12/24/11 98.2 F (36.8 C) Oral   SpO2 Readings from Last 3 Encounters:  01/01/12 90%  01/01/12 90%  12/24/11 97%   Pulse Readings from Last 3 Encounters:  01/01/12 87  01/01/12 87  12/24/11 100    General: A&O x 3, WDWN female in NAD Pulmonary: normal non-labored breathing , Cardiac: Heart rate : regular ,  Abdomen:abdomen soft and normal active bowel sounds Abdominal wound:clean, dry, intact  Neurologic: A&O X 3; Appropriate Affect ; SENSATION: normal; MOTOR FUNCTION:  moving all extremities equally. Speech is fluent/normal  Vascular Exam:BLE warm and well perfused Extremities without ischemic changes, no Gangrene , no cellulitis  LOWER EXTREMITY PULSES           RIGHT                                      LEFT      POSTERIOR TIBIAL doppler doppler       DORSALIS PEDIS      ANTERIOR TIBIAL doppler doppler    Non-Invasive Vascular Imaging ABI'S: Pending  Assessment/Plan: [name] is a 41 y.o. female who is 4 Days Post-Op Procedure(s): AORTA BIFEMORAL BYPASS GRAFT and Right BYPASS GRAFT FEMORAL-POPLITEAL  ARTERY Pain poorly controlled on reduced Dose PCA - will increase to full dose Acute on Chronic anemia - labs with HD Mobilize Bowel function improving - NGT 100 cc overnight - will pull NGT and keep NPO for now ESRD - HD per nephrology      Marlowe Shores [phone] 01/01/2012 9:38 AM   Agree with above. Patient seen and examined in dialysis Continue routine post-operative care Tollerating ice chips, advance diet in am if no nausea  Wells Georgian Mcclory

## 2012-01-02 LAB — CBC
MCH: 31.5 pg (ref 26.0–34.0)
MCHC: 32.3 g/dL (ref 30.0–36.0)
MCV: 97.5 fL (ref 78.0–100.0)
Platelets: 179 10*3/uL (ref 150–400)
RDW: 17.1 % — ABNORMAL HIGH (ref 11.5–15.5)
WBC: 7.7 10*3/uL (ref 4.0–10.5)

## 2012-01-02 LAB — GLUCOSE, CAPILLARY
Glucose-Capillary: 145 mg/dL — ABNORMAL HIGH (ref 70–99)
Glucose-Capillary: 147 mg/dL — ABNORMAL HIGH (ref 70–99)
Glucose-Capillary: 155 mg/dL — ABNORMAL HIGH (ref 70–99)
Glucose-Capillary: 158 mg/dL — ABNORMAL HIGH (ref 70–99)

## 2012-01-02 LAB — BASIC METABOLIC PANEL
BUN: 20 mg/dL (ref 6–23)
CO2: 23 mEq/L (ref 19–32)
Calcium: 9.3 mg/dL (ref 8.4–10.5)
Creatinine, Ser: 3.05 mg/dL — ABNORMAL HIGH (ref 0.50–1.10)

## 2012-01-02 MED ORDER — ALTEPLASE 2 MG IJ SOLR
2.0000 mg | Freq: Once | INTRAMUSCULAR | Status: AC | PRN
  Filled 2012-01-02: qty 2

## 2012-01-02 MED ORDER — CINACALCET HCL 30 MG PO TABS
30.0000 mg | ORAL_TABLET | Freq: Every day | ORAL | Status: DC
  Administered 2012-01-02 – 2012-01-05 (×4): 30 mg via ORAL
  Filled 2012-01-02 (×4): qty 1

## 2012-01-02 MED ORDER — SODIUM BICARBONATE 650 MG PO TABS
650.0000 mg | ORAL_TABLET | Freq: Two times a day (BID) | ORAL | Status: DC
Start: 2012-01-02 — End: 2012-01-02
  Filled 2012-01-02 (×2): qty 1

## 2012-01-02 MED ORDER — TRAZODONE HCL 100 MG PO TABS
200.0000 mg | ORAL_TABLET | Freq: Every day | ORAL | Status: DC
  Administered 2012-01-02 – 2012-01-04 (×3): 200 mg via ORAL
  Filled 2012-01-02 (×4): qty 2

## 2012-01-02 MED ORDER — LIDOCAINE HCL (PF) 1 % IJ SOLN: 5.0000 mL | INTRAMUSCULAR | Status: DC | PRN

## 2012-01-02 MED ORDER — HEPARIN SODIUM (PORCINE) 1000 UNIT/ML DIALYSIS
40.0000 [IU]/kg | Freq: Once | INTRAMUSCULAR | Status: DC
  Filled 2012-01-02: qty 4

## 2012-01-02 MED ORDER — ALPRAZOLAM 0.5 MG PO TABS
1.0000 mg | ORAL_TABLET | Freq: Four times a day (QID) | ORAL | Status: DC | PRN
  Administered 2012-01-02 – 2012-01-05 (×6): 1 mg via ORAL
  Filled 2012-01-02: qty 1
  Filled 2012-01-02: qty 2
  Filled 2012-01-02: qty 1
  Filled 2012-01-02 (×3): qty 2
  Filled 2012-01-02: qty 1
  Filled 2012-01-02: qty 2

## 2012-01-02 MED ORDER — POTASSIUM CHLORIDE CRYS ER 20 MEQ PO TBCR
20.0000 meq | EXTENDED_RELEASE_TABLET | Freq: Once | ORAL | Status: AC
  Administered 2012-01-02: 20 meq via ORAL
  Filled 2012-01-02: qty 1

## 2012-01-02 MED ORDER — TORSEMIDE 100 MG PO TABS
100.0000 mg | ORAL_TABLET | Freq: Every day | ORAL | Status: DC
  Filled 2012-01-02: qty 1

## 2012-01-02 MED ORDER — SEVELAMER CARBONATE 800 MG PO TABS
1600.0000 mg | ORAL_TABLET | ORAL | Status: DC | PRN
  Filled 2012-01-02: qty 2

## 2012-01-02 MED ORDER — LISINOPRIL 40 MG PO TABS
40.0000 mg | ORAL_TABLET | Freq: Every day | ORAL | Status: DC
Start: 2012-01-02 — End: 2012-01-02
  Filled 2012-01-02: qty 1

## 2012-01-02 MED ORDER — CLOPIDOGREL BISULFATE 75 MG PO TABS
75.0000 mg | ORAL_TABLET | Freq: Every day | ORAL | Status: DC
  Administered 2012-01-02 – 2012-01-05 (×3): 75 mg via ORAL
  Filled 2012-01-02 (×4): qty 1

## 2012-01-02 MED ORDER — PANTOPRAZOLE SODIUM 40 MG PO TBEC
40.0000 mg | DELAYED_RELEASE_TABLET | Freq: Every day | ORAL | Status: DC
  Administered 2012-01-02 – 2012-01-05 (×3): 40 mg via ORAL
  Filled 2012-01-02 (×3): qty 1

## 2012-01-02 MED ORDER — HEPARIN SODIUM (PORCINE) 1000 UNIT/ML DIALYSIS
1000.0000 [IU] | INTRAMUSCULAR | Status: DC | PRN
  Filled 2012-01-02: qty 1

## 2012-01-02 MED ORDER — NEPRO/CARBSTEADY PO LIQD
237.0000 mL | ORAL | Status: DC | PRN
  Filled 2012-01-02 (×3): qty 237

## 2012-01-02 MED ORDER — BUTALBITAL-APAP-CAFFEINE 50-325-40 MG PO TABS
1.0000 | ORAL_TABLET | Freq: Four times a day (QID) | ORAL | Status: DC | PRN
  Administered 2012-01-02 – 2012-01-03 (×3): 1 via ORAL
  Filled 2012-01-02 (×4): qty 1

## 2012-01-02 MED ORDER — PENTAFLUOROPROP-TETRAFLUOROETH EX AERO: 1.0000 "application " | INHALATION_SPRAY | CUTANEOUS | Status: DC | PRN

## 2012-01-02 MED ORDER — AMLODIPINE BESYLATE 10 MG PO TABS
10.0000 mg | ORAL_TABLET | Freq: Every day | ORAL | Status: DC
  Filled 2012-01-02: qty 1

## 2012-01-02 MED ORDER — RENA-VITE PO TABS
1.0000 | ORAL_TABLET | Freq: Every day | ORAL | Status: DC
  Administered 2012-01-02 – 2012-01-04 (×3): 1 via ORAL
  Filled 2012-01-02 (×4): qty 1

## 2012-01-02 MED ORDER — CLONIDINE HCL 0.2 MG PO TABS
0.2000 mg | ORAL_TABLET | Freq: Two times a day (BID) | ORAL | Status: DC
  Filled 2012-01-02 (×2): qty 1

## 2012-01-02 MED ORDER — LIDOCAINE-PRILOCAINE 2.5-2.5 % EX CREA
1.0000 "application " | TOPICAL_CREAM | CUTANEOUS | Status: DC | PRN
  Filled 2012-01-02: qty 5

## 2012-01-02 MED ORDER — SODIUM CHLORIDE 0.9 % IV SOLN: 100.0000 mL | INTRAVENOUS | Status: DC | PRN

## 2012-01-02 MED ORDER — SUCRALFATE 1 G PO TABS
1.0000 g | ORAL_TABLET | Freq: Two times a day (BID) | ORAL | Status: DC
  Filled 2012-01-02 (×2): qty 1

## 2012-01-02 MED ORDER — PAROXETINE HCL 30 MG PO TABS
80.0000 mg | ORAL_TABLET | Freq: Every day | ORAL | Status: DC
  Administered 2012-01-02 – 2012-01-05 (×3): 80 mg via ORAL
  Filled 2012-01-02 (×4): qty 1

## 2012-01-02 MED ORDER — ARIPIPRAZOLE 2 MG PO TABS
2.0000 mg | ORAL_TABLET | Freq: Every day | ORAL | Status: DC
  Administered 2012-01-02 – 2012-01-05 (×3): 2 mg via ORAL
  Filled 2012-01-02 (×4): qty 1

## 2012-01-02 MED ORDER — SODIUM CHLORIDE 0.9 % IJ SOLN
INTRAMUSCULAR | Status: AC
  Filled 2012-01-02: qty 20

## 2012-01-02 MED ORDER — METOCLOPRAMIDE HCL 5 MG PO TABS
5.0000 mg | ORAL_TABLET | Freq: Four times a day (QID) | ORAL | Status: DC
  Administered 2012-01-02 – 2012-01-05 (×12): 5 mg via ORAL
  Filled 2012-01-02: qty 0.5
  Filled 2012-01-02 (×6): qty 1
  Filled 2012-01-02: qty 0.5
  Filled 2012-01-02 (×2): qty 1
  Filled 2012-01-02: qty 0.5
  Filled 2012-01-02 (×4): qty 1
  Filled 2012-01-02: qty 0.5

## 2012-01-02 MED ORDER — PROMETHAZINE HCL 25 MG PO TABS
25.0000 mg | ORAL_TABLET | Freq: Four times a day (QID) | ORAL | Status: DC | PRN
  Administered 2012-01-02 – 2012-01-03 (×2): 25 mg via ORAL
  Filled 2012-01-02 (×2): qty 1

## 2012-01-02 MED ORDER — SEVELAMER CARBONATE 800 MG PO TABS
4000.0000 mg | ORAL_TABLET | Freq: Three times a day (TID) | ORAL | Status: DC
  Administered 2012-01-02 – 2012-01-03 (×2): 4000 mg via ORAL
  Filled 2012-01-02 (×9): qty 5

## 2012-01-02 NOTE — Consult Note (Signed)
Pt is too sleepy to talk and needs to get her rest at this time.

## 2012-01-02 NOTE — Progress Notes (Signed)
OT/ PT Cancellation Note  Treatment cancelled today due to patient's refusal to participate: pt reports 10 out 10 pain, diarrhea, lack of sleep and room change. Pt encouraged to participate again and educated on the need to complete ADLs such as getting to the restroom even with pain as precursor to preparing for d/c home. Pt stated "thank you for being so nice but I'd rather do it later." Therapist informed patient that treatment would be 01/03/12 before or after HD and pt acknowledged by stating "id rather do it after HD than now."  Therapist second attempt at patient today  Lucile Shutters   OTR/L Pager: [phone] Office: [phone] .

## 2012-01-02 NOTE — Progress Notes (Signed)
OT / PT Cancellation Note  Treatment cancelled today due to:  Pt declining OOB with OT/ PT this AM stating "I haven't gotten much sleep last night and my legs and stomach hurt". OT / PT to continue to follow acutely.    [name]   OTR/L Pager: [phone] Office: [phone] .

## 2012-01-02 NOTE — Progress Notes (Addendum)
[facility] [place]  Post-op  Intra-abdominal Surgery note  Date of Surgery: 12/28/2011 Surgeon: Surgeon(s): Nada Libman, MD POD: 5 Days Post-Op Procedure(s): AORTA BIFEMORAL BYPASS GRAFT - Right BYPASS GRAFT FEMORAL-POPLITEAL ARTERY  History of Present Illness  [name] is a 41 y.o. female who is hungry this am.Pt is doing well. denies incisional pain; denies nausea/vomiting; denies diarrhea. has had flatus;has had BM Pain better controlled on full dose PCA. HD yesterday. K+ 3.1  Significant Diagnostic Studies: CBC Lab Results  Component Value Date   WBC 7.7 01/02/2012   HGB 7.6* 01/02/2012   HCT 23.5* 01/02/2012   MCV 97.5 01/02/2012   PLT 179 01/02/2012    BMET    Component Value Date/Time   NA 139 01/02/2012 0409   K 3.1* 01/02/2012 0409   CL 97 01/02/2012 0409   CO2 23 01/02/2012 0409   GLUCOSE 146* 01/02/2012 0409   BUN 20 01/02/2012 0409   CREATININE 3.05* 01/02/2012 0409   CREATININE 1.78* 10/17/2008 2000   CALCIUM 9.3 01/02/2012 0409   CALCIUM 8.3* 08/21/2011 0533   GFRNONAA 18* 01/02/2012 0409   GFRAA 21* 01/02/2012 0409    COAG Lab Results  Component Value Date   INR 1.14 12/28/2011   INR 1.07 12/24/2011   INR 0.96 12/20/2011   No results found for this basename: PTT    I/O last 3 completed shifts: In: -  Out: 2509 [Emesis/NG output:1100; Other:1409]    Physical Examination BP Readings from Last 3 Encounters:  01/02/12 119/55  01/02/12 119/55  12/24/11 120/88   Temp Readings from Last 3 Encounters:  01/02/12 98.1 F (36.7 C) Oral  01/02/12 98.1 F (36.7 C) Oral  12/24/11 98.2 F (36.8 C) Oral   SpO2 Readings from Last 3 Encounters:  01/02/12 97%  01/02/12 97%  12/24/11 97%   Pulse Readings from Last 3 Encounters:  01/02/12 90  01/02/12 90  12/24/11 100    General: A&O x 3, WDWN female in NAD Pulmonary: normal non-labored breathing ,  Cardiac: Heart rate : regular ,  Abdomen:abdomen soft, non-tender and normal active bowel  sounds Abdominal wound:clean, dry, intact Groin wounds healing well  Neurologic: A&O X 3; Appropriate Affect ; SENSATION: normal; MOTOR FUNCTION:  moving all extremities equally. Speech is fluent/normal  Vascular Exam:BLE warm and well perfused Extremities without ischemic changes, no Gangrene , no cellulitis; no open wounds;   LOWER EXTREMITY PULSES           RIGHT                                      LEFT      POSTERIOR TIBIAL doppler doppler       DORSALIS PEDIS      ANTERIOR TIBIAL doppler doppler       PERONEAL doppler doppler    Non-Invasive Vascular Imaging ABI'S: pending  Assessment/Plan: [name] is a 41 y.o. female who is 5 Days Post-Op Procedure(s): AORTA BIFEMORAL BYPASS GRAFT and right BYPASS GRAFT FEMORAL-POPLITEAL ARTERY Pt doing well - hungry with good bowel function- will advance diet Pain better controlled on full dose PCA will wean over next day. restart PO meds Hypokalemia - replacement per Nephrology [place] 2000 Mobilize Asymptomatic Acute on Chronic anemia remains stable      [name] [phone] 01/02/2012 8:47 AM    Agree with above OK to go to  floor Repeat CBC in am  [name]

## 2012-01-02 NOTE — Progress Notes (Signed)
Subjective: Interval History: has complaints pain.  Objective: Vital signs in last 24 hours: Temp:  [97 F (36.1 C)-98.5 F (36.9 C)] 98.1 F (36.7 C) (05/08 0348) Pulse Rate:  [90-113] 90  (05/08 0009) Resp:  [17-22] 20  (05/08 0009) BP: (79-132)/(37-72) 119/55 mmHg (05/08 0009) SpO2:  [91 %-97 %] 97 % (05/08 0009) Weight:  [78.1 kg (172 lb 2.9 oz)-80.2 kg (176 lb 12.9 oz)] 80.2 kg (176 lb 12.9 oz) (05/08 0348) Weight change: -2.2 kg (-4 lb 13.6 oz)  Intake/Output from previous day: 05/07 0701 - 05/08 0700 In: -  Out: 1509 [Emesis/NG output:100] Intake/Output this shift:    General appearance: cooperative and slowed mentation Resp: diminished breath sounds bilaterally and rhonchi bibasilar Chest wall: no tenderness, PC R Cardio: S1, S2 normal and systolic murmur: systolic ejection 2/6, decrescendo at 2nd left intercostal space GI: Incision clean, pos bs soft Extremities: Ulcers R calf and foot  Lab Results:  Basename 01/02/12 0409 01/01/12 1152  WBC 7.7 10.4  HGB 7.6* 7.9*  HCT 23.5* 24.6*  PLT 179 178   BMET:  Basename 01/02/12 0409 01/01/12 1152  NA 139 140  K 3.1* 3.4*  CL 97 93*  CO2 23 23  GLUCOSE 146* 162*  BUN 20 58*  CREATININE 3.05* 6.57*  CALCIUM 9.3 9.5   No results found for this basename: PTH:2 in the last 72 hours Iron Studies:  Basename 01/01/12 1152  IRON 44  TIBC 151*  TRANSFERRIN --  FERRITIN --    Studies/Results: No results found.  I have reviewed the patient's current medications.  Assessment/Plan: 1 ESRD for HD in am. Needs to stay on HD 2  MEd Rec serious errors 3 Dm fair control 4 PVD per VVS 5 CAD 6 Anemia epo/fe P HD, correct med errors.  Epo/fe K    LOS: 5 days   Scotty Pinder L 01/02/2012,10:21 AM

## 2012-01-02 NOTE — Progress Notes (Signed)
[name], PT [phone]  

## 2012-01-02 NOTE — Progress Notes (Signed)
Cadince Hilscher, PT [phone]  

## 2012-01-03 ENCOUNTER — Inpatient Hospital Stay (HOSPITAL_COMMUNITY): Payer: Medicare Other

## 2012-01-03 LAB — CBC
HCT: 24.8 % — ABNORMAL LOW (ref 36.0–46.0)
Hemoglobin: 8.2 g/dL — ABNORMAL LOW (ref 12.0–15.0)
MCV: 95.8 fL (ref 78.0–100.0)
RBC: 2.59 MIL/uL — ABNORMAL LOW (ref 3.87–5.11)
WBC: 10.3 10*3/uL (ref 4.0–10.5)

## 2012-01-03 LAB — GLUCOSE, CAPILLARY: Glucose-Capillary: 307 mg/dL — ABNORMAL HIGH (ref 70–99)

## 2012-01-03 LAB — RENAL FUNCTION PANEL
BUN: 33 mg/dL — ABNORMAL HIGH (ref 6–23)
CO2: 25 mEq/L (ref 19–32)
Chloride: 94 mEq/L — ABNORMAL LOW (ref 96–112)
Creatinine, Ser: 4.77 mg/dL — ABNORMAL HIGH (ref 0.50–1.10)
Potassium: 2.8 mEq/L — ABNORMAL LOW (ref 3.5–5.1)

## 2012-01-03 LAB — PHOSPHORUS: Phosphorus: 5.6 mg/dL — ABNORMAL HIGH (ref 2.3–4.6)

## 2012-01-03 MED ORDER — OXYCODONE-ACETAMINOPHEN 5-325 MG PO TABS
ORAL_TABLET | ORAL | Status: AC
  Administered 2012-01-03: 2 via ORAL
  Filled 2012-01-03: qty 2

## 2012-01-03 MED ORDER — ONDANSETRON HCL 4 MG/2ML IJ SOLN
INTRAMUSCULAR | Status: AC
  Administered 2012-01-03: 4 mg via INTRAVENOUS
  Filled 2012-01-03: qty 2

## 2012-01-03 MED ORDER — INSULIN GLARGINE 100 UNIT/ML ~~LOC~~ SOLN: 20.0000 [IU] | Freq: Every day | SUBCUTANEOUS | Status: DC

## 2012-01-03 MED ORDER — HYDROMORPHONE HCL PF 1 MG/ML IJ SOLN
1.0000 mg | INTRAMUSCULAR | Status: DC | PRN
  Administered 2012-01-03 – 2012-01-05 (×11): 1 mg via INTRAVENOUS
  Filled 2012-01-03 (×11): qty 1

## 2012-01-03 MED ORDER — OXYCODONE-ACETAMINOPHEN 5-325 MG PO TABS
1.0000 | ORAL_TABLET | ORAL | Status: DC | PRN
  Administered 2012-01-03 – 2012-01-05 (×9): 2 via ORAL
  Filled 2012-01-03 (×9): qty 2

## 2012-01-03 NOTE — Progress Notes (Signed)
Subjective: Interval History: has complaints pain.  Objective: Vital signs in last 24 hours: Temp:  [97.3 F (36.3 C)-98.5 F (36.9 C)] 97.3 F (36.3 C) (05/09 1135) Pulse Rate:  [90-107] 100  (05/09 1135) Resp:  [12-21] 21  (05/09 1135) BP: (127-158)/(67-85) 155/85 mmHg (05/09 1135) SpO2:  [92 %-100 %] 94 % (05/09 1135) Weight:  [79.652 kg (175 lb 9.6 oz)-80.6 kg (177 lb 11.1 oz)] 80.6 kg (177 lb 11.1 oz) (05/09 1135) Weight change: 0.552 kg (1 lb 3.5 oz)  Intake/Output from previous day: 05/08 0701 - 05/09 0700 In: 760.3 [P.O.:480; I.V.:280.3] Out: 2 [Stool:2] Intake/Output this shift:    General appearance: alert, cooperative and sallow complex Resp: diminished breath sounds bilaterally and rhonchi LLL Chest wall: no tenderness, R IJ PC Cardio: regular rate and rhythm and systolic murmur: systolic ejection 2/6, decrescendo at 2nd left intercostal space GI: obese, clean incisions, pos bs,, liver down 5 cm Extremities: ulcers, R calf and foot  Lab Results:  Basename 01/02/12 0409 01/01/12 1152  WBC 7.7 10.4  HGB 7.6* 7.9*  HCT 23.5* 24.6*  PLT 179 178   BMET:  Basename 01/02/12 0409 01/01/12 1152  NA 139 140  K 3.1* 3.4*  CL 97 93*  CO2 23 23  GLUCOSE 146* 162*  BUN 20 58*  CREATININE 3.05* 6.57*  CALCIUM 9.3 9.5   No results found for this basename: PTH:2 in the last 72 hours Iron Studies:  Basename 01/01/12 1152  IRON 44  TIBC 151*  TRANSFERRIN --  FERRITIN --    Studies/Results: No results found.  I have reviewed the patient's current medications.  Assessment/Plan: 1 CRf for HD.  Vol ^ mild 2 Anemia stable on meds.   3 HTN follow and adjust meds 4 PVD per VVS. 5 COPD/ somoking P HD, epo, bp control, mobilize    LOS: 6 days   Abena Erdman L 01/03/2012,11:45 AM

## 2012-01-03 NOTE — Procedures (Signed)
Patient was seen on dialysis and the procedure was supervised.  BFR 400  Via PC BP is  117/71.   Patient appears to be tolerating treatment well  Shelbey Spindler A 01/03/2012

## 2012-01-03 NOTE — Progress Notes (Signed)
Physical Therapy Cancellation NOte: pt currently in HD and unable to participate in therapy. Will attempt in pm as time allows. Thanks Delaney Meigs, PT [phone]

## 2012-01-03 NOTE — Progress Notes (Signed)
Occupational Therapy Treatment Patient Details Name: DEVYN GRIFFING MRN: [id number] DOB: May 19, 1971 Today's Date: 01/03/2012 Time: 4010-2725 OT Time Calculation (min): 24 min  OT Assessment / Plan / Recommendation Comments on Treatment Session Pt progressing with therapy.     Follow Up Recommendations  Home health OT           Equipment Recommendations   (TBD pending pt progress)        Frequency Min 2X/week   Plan Discharge plan remains appropriate    Precautions / Restrictions Precautions Precautions: Fall Restrictions Weight Bearing Restrictions: No   Pertinent Vitals/Pain Pt c/o 8/10 stomach pain. PCA initially used and then stopped by RN at end of session per MD order. RN aware pt requesting alternate pain meds    ADL  Grooming: Performed;Wash/dry hands;Supervision/safety Where Assessed - Grooming: Standing at sink Lower Body Dressing: Performed;+1 Total assistance (don bilateral socks) Where Assessed - Lower Body Dressing: Sitting, bed (limited by pain) Toilet Transfer: Performed;Minimal assistance Toilet Transfer Method: Stand pivot Toilet Transfer Equipment: Bedside commode Toileting - Clothing Manipulation: Performed;Minimal assistance Where Assessed - Glass blower/designer Manipulation: Standing Toileting - Hygiene: Performed;Set up (Min guard A) Where Assessed - Toileting Hygiene: Standing Ambulation Related to ADLs: Min guard A with RW ambulation throughout room.  ADL Comments: pt progressing with therapy.     OT Diagnosis:    OT Problem List:   OT Treatment Interventions:     OT Goals ADL Goals ADL Goal: Grooming - Progress: Progressing toward goals ADL Goal: Lower Body Bathing - Progress: Progressing toward goals ADL Goal: Lower Body Dressing - Progress: Progressing toward goals ADL Goal: Toilet Transfer - Progress: Progressing toward goals ADL Goal: Toileting - Clothing Manipulation - Progress: Progressing toward goals ADL Goal: Toileting - Hygiene -  Progress: Progressing toward goals  Visit Information  Last OT Received On: 01/03/12 Assistance Needed: +1 PT/OT Co-Evaluation/Treatment: Yes               Cognition  Overall Cognitive Status: Appears within functional limits for tasks assessed/performed Arousal/Alertness:  (sleepy) Behavior During Session: [facility] for tasks performed    Mobility Bed Mobility Left Sidelying to Sit: 5: Supervision;With rails;HOB elevated (~60degrees) Sitting - Scoot to Edge of Bed: 5: Supervision;With rail Details for Bed Mobility Assistance: pt insisting on elevating HOB despite therapist request to lay bed flat. Pt states cannot lay HOB flat due to stomach pain Transfers Sit to Stand: 4: Min guard;From chair/3-in-1;With armrests Stand to Sit: 4: Min guard;With armrests;To chair/3-in-1 Details for Transfer Assistance: VC for hand placement and to control descent            End of Session OT - End of Session Equipment Utilized During Treatment: Gait belt Activity Tolerance: Patient tolerated treatment well Patient left: in chair;with call bell/phone within reach Nurse Communication: Mobility status   Marston Mccadden 01/03/2012, 11:18 AM

## 2012-01-03 NOTE — Progress Notes (Addendum)
[facility] [place]  Progress Note Bypass Surgery  Date of Surgery: 12/28/2011  Procedure(s): AORTA BIFEMORAL BYPASS GRAFT BYPASS GRAFT FEMORAL-POPLITEAL ARTERY Surgeon: Surgeon(s): Nada Libman, MD  6 Days Post-Op  History of Present Illness  [name] is a 41 y.o. female who is S/P Procedure(s): AORTA BIFEMORAL BYPASS GRAFT BYPASS GRAFT FEMORAL-POPLITEAL ARTERY right.  The patient's pre-op symptoms of pain are Improved . Patients pain is well controlled.       Imaging: No results found.  Significant Diagnostic Studies: CBC Lab Results  Component Value Date   WBC 7.7 01/02/2012   HGB 7.6* 01/02/2012   HCT 23.5* 01/02/2012   MCV 97.5 01/02/2012   PLT 179 01/02/2012    BMET     Component Value Date/Time   NA 139 01/02/2012 0409   K 3.1* 01/02/2012 0409   CL 97 01/02/2012 0409   CO2 23 01/02/2012 0409   GLUCOSE 146* 01/02/2012 0409   BUN 20 01/02/2012 0409   CREATININE 3.05* 01/02/2012 0409   CREATININE 1.78* 10/17/2008 2000   CALCIUM 9.3 01/02/2012 0409   CALCIUM 8.3* 08/21/2011 0533   GFRNONAA 18* 01/02/2012 0409   GFRAA 21* 01/02/2012 0409    COAG Lab Results  Component Value Date   INR 1.14 12/28/2011   INR 1.07 12/24/2011   INR 0.96 12/20/2011   No results found for this basename: PTT    Physical Examination  BP Readings from Last 3 Encounters:  01/03/12 135/76  01/03/12 135/76  12/24/11 120/88   Temp Readings from Last 3 Encounters:  01/03/12 97.8 F (36.6 C) Oral  01/03/12 97.8 F (36.6 C) Oral  12/24/11 98.2 F (36.8 C) Oral   SpO2 Readings from Last 3 Encounters:  01/03/12 93%  01/03/12 93%  12/24/11 97%   Pulse Readings from Last 3 Encounters:  01/03/12 99  01/03/12 99  12/24/11 100    Pt is A&O x 3 right lower extremity: Incision/s is/are clean,dry.intact, and  healing without hematoma, erythema or drainage Limb is warm; with good color.  Vascular Exam:BLE warm and well perfused Right and left LE doppler pulses  DP/PT.   Assessment/Plan: Pt. Doing well Post-op pain is controlled Wounds are healing well PT/OT for ambulation Continue wound care as ordered Social work consult Clinton Gallant [facility] [phone] 01/03/2012 9:02 AM  Diarrhea today Pain better controlled Abdomen tender but soft Will check CDiff and hemecheck stool Do not suspect ischemic colitis at this point   [name]

## 2012-01-03 NOTE — Progress Notes (Signed)
Referral rec'd for assessment as patient lives with elderly mother- patient currently not on unit- noted PT consulted and await their input for recommendations and d/c planning. Will follow up with patient tomorrow for full assessment and d/c planning. Reece Levy, MSW, Theresia Majors [phone]

## 2012-01-04 LAB — GLUCOSE, CAPILLARY
Glucose-Capillary: 148 mg/dL — ABNORMAL HIGH (ref 70–99)
Glucose-Capillary: 269 mg/dL — ABNORMAL HIGH (ref 70–99)
Glucose-Capillary: 286 mg/dL — ABNORMAL HIGH (ref 70–99)
Glucose-Capillary: 296 mg/dL — ABNORMAL HIGH (ref 70–99)

## 2012-01-04 LAB — RENAL FUNCTION PANEL
CO2: 27 mEq/L (ref 19–32)
Calcium: 8.8 mg/dL (ref 8.4–10.5)
Creatinine, Ser: 2.63 mg/dL — ABNORMAL HIGH (ref 0.50–1.10)
GFR calc Af Amer: 25 mL/min — ABNORMAL LOW (ref >90)
Glucose, Bld: 234 mg/dL — ABNORMAL HIGH (ref 70–99)

## 2012-01-04 LAB — CREATININE, SERUM
Creatinine, Ser: 2.55 mg/dL — ABNORMAL HIGH (ref 0.50–1.10)
GFR calc non Af Amer: 22 mL/min — ABNORMAL LOW (ref >90)

## 2012-01-04 LAB — CBC
MCH: 31.2 pg (ref 26.0–34.0)
MCV: 97.4 fL (ref 78.0–100.0)
Platelets: 189 10*3/uL (ref 150–400)
RBC: 2.69 MIL/uL — ABNORMAL LOW (ref 3.87–5.11)
RDW: 17.1 % — ABNORMAL HIGH (ref 11.5–15.5)
WBC: 7.4 10*3/uL (ref 4.0–10.5)

## 2012-01-04 MED ORDER — HEPARIN SODIUM (PORCINE) 1000 UNIT/ML DIALYSIS: 1000.0000 [IU] | INTRAMUSCULAR | Status: DC | PRN

## 2012-01-04 MED ORDER — HEPARIN SODIUM (PORCINE) 1000 UNIT/ML DIALYSIS
40.0000 [IU]/kg | Freq: Once | INTRAMUSCULAR | Status: DC
  Filled 2012-01-04: qty 4

## 2012-01-04 MED ORDER — SODIUM CHLORIDE 0.9 % IV SOLN: 100.0000 mL | INTRAVENOUS | Status: DC | PRN

## 2012-01-04 MED ORDER — LIDOCAINE HCL (PF) 1 % IJ SOLN: 5.0000 mL | INTRAMUSCULAR | Status: DC | PRN

## 2012-01-04 MED ORDER — INSULIN GLARGINE 100 UNIT/ML ~~LOC~~ SOLN
25.0000 [IU] | Freq: Every day | SUBCUTANEOUS | Status: DC
  Administered 2012-01-04 – 2012-01-05 (×2): 25 [IU] via SUBCUTANEOUS

## 2012-01-04 MED ORDER — CALCIUM ACETATE 667 MG PO CAPS
2001.0000 mg | ORAL_CAPSULE | Freq: Three times a day (TID) | ORAL | Status: DC
  Administered 2012-01-04 – 2012-01-05 (×5): 2001 mg via ORAL
  Filled 2012-01-04 (×6): qty 3

## 2012-01-04 MED ORDER — LIDOCAINE-PRILOCAINE 2.5-2.5 % EX CREA: 1.0000 "application " | TOPICAL_CREAM | CUTANEOUS | Status: DC | PRN

## 2012-01-04 MED ORDER — NEPRO/CARBSTEADY PO LIQD: 237.0000 mL | ORAL | Status: DC | PRN

## 2012-01-04 MED ORDER — ALTEPLASE 2 MG IJ SOLR
2.0000 mg | Freq: Once | INTRAMUSCULAR | Status: AC | PRN
  Filled 2012-01-04: qty 2

## 2012-01-04 MED ORDER — PENTAFLUOROPROP-TETRAFLUOROETH EX AERO: 1.0000 "application " | INHALATION_SPRAY | CUTANEOUS | Status: DC | PRN

## 2012-01-04 NOTE — Progress Notes (Signed)
Inpatient Diabetes Program Recommendations  AACE/ADA: New Consensus Statement on Inpatient Glycemic Control (2009)  Target Ranges:  Prepandial:   less than 140 mg/dL      Peak postprandial:   less than 180 mg/dL (1-2 hours)      Critically ill patients:  140 - 180 mg/dL   Reason for Visit: 09/01/08  CBGs [phone] mg/dl     9/60/45  CBGs 409 mg/dl  Inpatient Diabetes Program Recommendations Correction (SSI): Increase Novolog correction scale to MODERATE AC and HS  if CBGs continue greater than 180 mg/dl  Note:

## 2012-01-04 NOTE — Progress Notes (Signed)
Physical Therapy Treatment Patient Details Name: [name] MRN: [id number] DOB: 25-Jan-1971 Today's Date: 01/04/2012 Time: 4098-1191 PT Time Calculation (min): 25 min  PT Assessment / Plan / Recommendation Comments on Treatment Session  Pt s/p aortobifem BPG and RLE BPG progressing well with mobility and continues to benefit from acute therapy for continued improvement in independence. Pt encouraged to ambulate to bathroom and continue HEP. RN reported HR up to 190 with ambulation but back to 120 with sitting and continued to decrease to 107 sitting with LE exercises.     Follow Up Recommendations  Home health PT    Barriers to Discharge        Equipment Recommendations  None recommended by PT    Recommendations for Other Services    Frequency     Plan Discharge plan remains appropriate    Precautions / Restrictions     Pertinent Vitals/Pain 8/10 abdominal pain Premedicated and RN notified    Mobility  Bed Mobility Bed Mobility: Rolling Left;Left Sidelying to Sit;Sitting - Scoot to Edge of Bed Rolling Left: 6: Modified independent (Device/Increase time);With rail Left Sidelying to Sit: 6: Modified independent (Device/Increase time);With rails;HOB elevated (HOB 20degrees) Sitting - Scoot to Edge of Bed: 6: Modified independent (Device/Increase time) Transfers Transfers: Sit to Stand;Stand to Sit Sit to Stand: 5: Supervision;From bed Stand to Sit: 5: Supervision;With armrests;To chair/3-in-1 Details for Transfer Assistance: cueing for hand placement and safety Ambulation/Gait Ambulation/Gait Assistance: 5: Supervision Ambulation Distance (Feet): 75 Feet Assistive device: Rolling walker Ambulation/Gait Assistance Details: cueing for posture and to increase stride Gait Pattern: Step-through pattern;Decreased stride length;Trunk flexed Stairs: No    Exercises General Exercises - Lower Extremity Long Arc Quad: AROM;Both;20 reps;Seated Hip Flexion/Marching: AROM;Both;10  reps;Seated   PT Diagnosis:    PT Problem List:   PT Treatment Interventions:     PT Goals    Visit Information  Last PT Received On: 01/04/12 Assistance Needed: +1    Subjective Data  Subjective: I have been getting up to the bathroom   Cognition  Overall Cognitive Status: Appears within functional limits for tasks assessed/performed Arousal/Alertness: Awake/alert Orientation Level: Appears intact for tasks assessed Behavior During Session: [facility] for tasks performed    Balance     End of Session PT - End of Session Activity Tolerance: Patient tolerated treatment well Patient left: in chair;with call bell/phone within reach Nurse Communication: Mobility status    Delorse Lek 01/04/2012, 10:21 AM Delaney Meigs, PT [phone]

## 2012-01-04 NOTE — Progress Notes (Signed)
[facility] Progress Note  01/04/2012 7:47 AM POD 7  Subjective:  Wants to know if she is going home today   Afebrile x 24 hours  90s-180s systolic  HR 90s-110s  93%RA Filed Vitals:   01/04/12 0411  BP: 143/79  Pulse: 108  Temp: 98.4 F (36.9 C)  Resp: 16     Physical Exam: Cardiac:  RRR Lungs:  CTAB  Abdomen:  Soft NT/ND +BS Incisions: bilateral groins and abdominal incision C/d/i without drainage Extremities:  Warm and well perfused.  CBC    Component Value Date/Time   WBC 7.4 01/04/2012 0520   RBC 2.69* 01/04/2012 0520   HGB 8.4* 01/04/2012 0520   HCT 26.2* 01/04/2012 0520   PLT 189 01/04/2012 0520   MCV 97.4 01/04/2012 0520   MCH 31.2 01/04/2012 0520   MCHC 32.1 01/04/2012 0520   RDW 17.1* 01/04/2012 0520   LYMPHSABS 0.8 10/30/2011 1848   MONOABS 0.3 10/30/2011 1848   EOSABS 0.0 10/30/2011 1848   BASOSABS 0.1 10/30/2011 1848    BMET    Component Value Date/Time   NA 136 01/03/2012 1206   K 2.8* 01/03/2012 1206   CL 94* 01/03/2012 1206   CO2 25 01/03/2012 1206   GLUCOSE 247* 01/03/2012 1206   BUN 33* 01/03/2012 1206   CREATININE 4.77* 01/03/2012 1206   CREATININE 1.78* 10/17/2008 2000   CALCIUM 9.6 01/03/2012 1206   CALCIUM 8.3* 08/21/2011 0533   GFRNONAA 10* 01/03/2012 1206   GFRAA 12* 01/03/2012 1206    INR    Component Value Date/Time   INR 1.14 12/28/2011 1730   C. Difficile is not back  Intake/Output Summary (Last 24 hours) at 01/04/12 0747 Last data filed at 01/03/12 1600  Gross per 24 hour  Intake    480 ml  Output   1500 ml  Net  -1020 ml     Assessment/Plan:  41 y.o. female is s/p  Aorta-Bifemoral Bypass with bilateral profundoplasty (14X7 Dacron graft)  Right common femoral endarterectomy  Right femoral popliteal (above knee) bypass with 6mm external ringed Propatent PTFE    POD 7  -continues to progress -abdomen is soft and no tenderness -awaiting C. Diff results -pt on Zosyn-? Need for home ABx -continue mobilization   Doreatha Massed, [place] [facility] [phone] 01/04/2012 7:47 AM  OK to d/c home when home PT set up and pain controlled Needs home wound care D/w patient and family at bedside  [name]

## 2012-01-04 NOTE — Progress Notes (Signed)
Subjective: Interval History: has complaints sleepy.  Objective: Vital signs in last 24 hours: Temp:  [96.8 F (36 C)-98.7 F (37.1 C)] 98.4 F (36.9 C) (05/10 0411) Pulse Rate:  [96-112] 108  (05/10 0411) Resp:  [12-21] 16  (05/10 0411) BP: (91-184)/(50-90) 143/79 mmHg (05/10 0411) SpO2:  [91 %-100 %] 93 % (05/10 0411) Weight:  [76.7 kg (169 lb 1.5 oz)-87.9 kg (193 lb 12.6 oz)] 76.7 kg (169 lb 1.5 oz) (05/10 0411) Weight change: 0.948 kg (2 lb 1.5 oz)  Intake/Output from previous day: 05/09 0701 - 05/10 0700 In: 480 [P.O.:480] Out: 1500  Intake/Output this shift:    General appearance: appears older than stated age, pale and lethargic Resp: diminished breath sounds bilaterally and rhonchi RML Chest wall: no tenderness, RIJ cath Cardio: S1, S2 normal and systolic murmur: holosystolic 2/6, blowing at apex GI: abnormal findings:  pos bs, incision clean, soft, liver down 3 cm Pulses: bilat fem bruits  Lab Results:  Basename 01/04/12 0520 01/03/12 1206  WBC 7.4 10.3  HGB 8.4* 8.2*  HCT 26.2* 24.8*  PLT 189 178   BMET:  Basename 01/03/12 1206 01/02/12 0409  NA 136 139  K 2.8* 3.1*  CL 94* 97  CO2 25 23  GLUCOSE 247* 146*  BUN 33* 20  CREATININE 4.77* 3.05*  CALCIUM 9.6 9.3    Basename 01/02/12 0409  PTH 237.4*   Iron Studies:  Basename 01/01/12 1152  IRON 44  TIBC 151*  TRANSFERRIN --  FERRITIN --    Studies/Results: No results found.  I have reviewed the patient's current medications.  Assessment/Plan: 1 ESRD for HD on Sat.  Vol ok.  olc green.   2 Anemia epo/fe 3 PVD per VVS 4 CAD 5 Leg ulcer 6 DM ^ glu,^ insulin  P HD, chnge binder with loose stools,     LOS: 7 days   [name] L 01/04/2012,7:25 AM

## 2012-01-05 ENCOUNTER — Inpatient Hospital Stay (HOSPITAL_COMMUNITY): Payer: Medicare Other

## 2012-01-05 LAB — GLUCOSE, CAPILLARY
Glucose-Capillary: 214 mg/dL — ABNORMAL HIGH (ref 70–99)
Glucose-Capillary: 173 mg/dL — ABNORMAL HIGH (ref 70–99)

## 2012-01-05 LAB — CLOSTRIDIUM DIFFICILE BY PCR: Toxigenic C. Difficile by PCR: NEGATIVE

## 2012-01-05 LAB — CULTURE, BLOOD (ROUTINE X 2)
Culture  Setup Time: 201305050100
Culture  Setup Time: 201305050100
Culture: NO GROWTH

## 2012-01-05 LAB — CBC
HCT: 24.4 % — ABNORMAL LOW (ref 36.0–46.0)
MCHC: 31.6 g/dL (ref 30.0–36.0)
MCV: 98 fL (ref 78.0–100.0)
RDW: 16.8 % — ABNORMAL HIGH (ref 11.5–15.5)

## 2012-01-05 LAB — COMPREHENSIVE METABOLIC PANEL
Albumin: 1.9 g/dL — ABNORMAL LOW (ref 3.5–5.2)
BUN: 34 mg/dL — ABNORMAL HIGH (ref 6–23)
Creatinine, Ser: 4.78 mg/dL — ABNORMAL HIGH (ref 0.50–1.10)
Total Protein: 5.7 g/dL — ABNORMAL LOW (ref 6.0–8.3)

## 2012-01-05 LAB — PHOSPHORUS: Phosphorus: 3.9 mg/dL (ref 2.3–4.6)

## 2012-01-05 MED ORDER — CEPHALEXIN 250 MG PO CAPS: 250.0000 mg | ORAL_CAPSULE | Freq: Two times a day (BID) | ORAL | Status: DC

## 2012-01-05 MED ORDER — OXYCODONE-ACETAMINOPHEN 10-325 MG PO TABS: 1.0000 | ORAL_TABLET | ORAL | Status: DC

## 2012-01-05 MED ORDER — NICOTINE 21 MG/24HR TD PT24: 1.0000 | MEDICATED_PATCH | TRANSDERMAL | Status: DC

## 2012-01-05 MED ORDER — INSULIN GLARGINE 100 UNIT/ML ~~LOC~~ SOLN: 25.0000 [IU] | Freq: Every day | SUBCUTANEOUS | Status: DC

## 2012-01-05 MED ORDER — CARRASYN V WOUND DRESSING EX GEL: Freq: Every day | CUTANEOUS | Status: AC

## 2012-01-05 MED ORDER — INSULIN ASPART 100 UNIT/ML ~~LOC~~ SOLN
12.0000 [IU] | Freq: Once | SUBCUTANEOUS | Status: AC
  Administered 2012-01-05: 12 [IU] via SUBCUTANEOUS

## 2012-01-05 MED ORDER — DARBEPOETIN ALFA-POLYSORBATE 200 MCG/0.4ML IJ SOLN
INTRAMUSCULAR | Status: AC
  Administered 2012-01-05: 200 ug
  Filled 2012-01-05: qty 0.4

## 2012-01-05 NOTE — Progress Notes (Signed)
Patient initially refusing HD today. She states that she would like to just be discharged today and have HD tomorrow (HD not open tomorrow). VVS deferred to Renal. Paged Renal MD on call who informed writer that if patient did not have HD today, she would have to wait until Tuesday, which is unacceptable. Relayed information to patient that she needed HD today. Patient now agreeable to HD today, then discharge as planned. [name]

## 2012-01-05 NOTE — Progress Notes (Signed)
[facility] ROUNDING NOTE   Subjective:   Interval History: none. I want to go home.  Objective:  Vital signs in last 24 hours:  Temp:  [96.5 F (35.8 C)-97.8 F (36.6 C)] 97.5 F (36.4 C) (05/11 0517) Pulse Rate:  [89-93] 89  (05/11 0517) Resp:  [17-18] 18  (05/11 0517) BP: (126-151)/(68-88) 126/68 mmHg (05/11 0517) SpO2:  [93 %-95 %] 94 % (05/11 0517) Weight:  [81.693 kg (180 lb 1.6 oz)] 81.693 kg (180 lb 1.6 oz) (05/11 0517)  Weight change: 1.093 kg (2 lb 6.6 oz) Filed Weights   01/03/12 1600 01/04/12 0411 01/05/12 0517  Weight: 87.9 kg (193 lb 12.6 oz) 76.7 kg (169 lb 1.5 oz) 81.693 kg (180 lb 1.6 oz)    Intake/Output: I/O last 3 completed shifts: In: -  Out: 4 [Urine:2; Stool:2]   Intake/Output this shift:     CVS- RRR RS- CTA ABD- BS present soft non-distended wound appears clean EXT- no edema well perfused  Basic Metabolic Panel:  Lab 01/05/12 1610 01/04/12 0806 01/04/12 0520 01/03/12 1206 01/02/12 0409 01/01/12 1152 12/31/11 0400 12/30/11 0329  NA -- 136 -- 136 139 140 140 --  K -- 4.3 -- 2.8* 3.1* 3.4* 3.4* --  CL -- 98 -- 94* 97 93* 97 --  CO2 -- 27 -- 25 23 23 28  --  GLUCOSE 471* 234* -- 247* 146* 162* -- --  BUN -- 14 -- 33* 20 58* 41* --  CREATININE -- 2.63* 2.55* 4.77* 3.05* 6.57* -- --  CALCIUM -- 8.8 -- 9.6 9.3 -- -- --  MG -- -- -- -- -- -- -- --  PHOS -- 3.6 -- 5.6*5.6* -- 10.5* 9.4* 6.5*    Liver Function Tests:  Lab 01/04/12 0806 01/03/12 1206 01/01/12 1152 12/31/11 0400 12/30/11 0329  AST -- -- 17 -- --  ALT -- -- 13 -- --  ALKPHOS -- -- 139* -- --  BILITOT -- -- 0.3 -- --  PROT -- -- 5.7* -- --  ALBUMIN 1.8* 1.9* 1.8* 1.8* 1.9*   No results found for this basename: LIPASE:5,AMYLASE:5 in the last 168 hours No results found for this basename: AMMONIA:3 in the last 168 hours  CBC:  Lab 01/04/12 0520 01/03/12 1206 01/02/12 0409 01/01/12 1152 12/31/11 0400  WBC 7.4 10.3 7.7 10.4 11.8*  NEUTROABS -- -- -- -- --    HGB 8.4* 8.2* 7.6* 7.9* 7.9*  HCT 26.2* 24.8* 23.5* 24.6* 24.8*  MCV 97.4 95.8 97.5 96.5 95.8  PLT 189 178 179 178 156    Cardiac Enzymes: No results found for this basename: CKTOTAL:5,CKMB:5,CKMBINDEX:5,TROPONINI:5 in the last 168 hours  BNP: No components found with this basename: POCBNP:5  CBG:  Lab 01/05/12 1156 01/05/12 0824 01/05/12 0535 01/05/12 0138 01/05/12 0019  GLUCAP 579* 173* 214* 389* >600*    Microbiology: Results for orders placed during the hospital encounter of 12/28/11  CULTURE, BLOOD (ROUTINE X 2)     Status: Normal   Collection Time   12/29/11  8:15 PM      Component Value Range Status Comment   Specimen Description BLOOD RIGHT ARM   Final    Special Requests BOTTLES DRAWN AEROBIC ONLY 3 ML   Final    Culture  Setup Time [id number]   Final    Culture NO GROWTH 5 DAYS   Final    Report Status 01/05/2012 FINAL   Final   CULTURE, BLOOD (ROUTINE X 2)     Status: Normal  Collection Time   12/29/11  8:25 PM      Component Value Range Status Comment   Specimen Description BLOOD RIGHT HAND   Final    Special Requests BOTTLES DRAWN AEROBIC ONLY 3 ML   Final    Culture  Setup Time [id number]   Final    Culture NO GROWTH 5 DAYS   Final    Report Status 01/05/2012 FINAL   Final   CLOSTRIDIUM DIFFICILE BY PCR     Status: Normal   Collection Time   01/04/12  8:12 PM      Component Value Range Status Comment   C difficile by pcr NEGATIVE  NEGATIVE  Final     Coagulation Studies: No results found for this basename: LABPROT:5,INR:5 in the last 72 hours  Urinalysis: No results found for this basename: COLORURINE:2,APPERANCEUR:2,LABSPEC:2,PHURINE:2,GLUCOSEU:2,HGBUR:2,BILIRUBINUR:2,KETONESUR:2,PROTEINUR:2,UROBILINOGEN:2,NITRITE:2,LEUKOCYTESUR:2 in the last 72 hours    Imaging: No results found.   Medications:      . DISCONTD: sodium chloride 10 mL/hr at 01/05/12 0134      . ARIPiprazole  2 mg Oral Daily  . calcium acetate  2,001 mg Oral TID WC  .  cinacalcet  30 mg Oral Q supper  . clopidogrel  75 mg Oral Daily  . collagenase  1 application Topical Daily  . darbepoetin  200 mcg Intravenous Q Sat-HD  . docusate sodium  100 mg Oral Daily  . enoxaparin  30 mg Subcutaneous Q24H  . heparin  40 Units/kg Dialysis Once in dialysis  . homatropine  1 drop Right Eye TID  . insulin aspart  0-9 Units Subcutaneous Q4H  . insulin glargine  25 Units Subcutaneous Daily  . loteprednol  1 drop Right Eye QID  . metoCLOPramide  5 mg Oral QID  . multivitamin  1 tablet Oral QHS  . nicotine  21 mg Transdermal Q24H  . ofloxacin  1 drop Right Eye TID  . pantoprazole  40 mg Oral Q1200  . PARoxetine  80 mg Oral Daily  . piperacillin-tazobactam (ZOSYN)  IV  2.25 g Intravenous Q8H  . Travoprost (BAK Free)  1 drop Right Eye QHS  . traZODone  200 mg Oral QHS   sodium chloride, sodium chloride, acetaminophen, acetaminophen, albuterol, ALPRAZolam, alteplase, alum & mag hydroxide-simeth, artificial tears, diphenhydrAMINE, diphenhydrAMINE, feeding supplement (NEPRO CARB STEADY), heparin, heparin, hydrALAZINE, HYDROmorphone (DILAUDID) injection, labetalol, lidocaine, lidocaine-prilocaine, metoprolol, ondansetron (ZOFRAN) IV, oxyCODONE-acetaminophen, pentafluoroprop-tetrafluoroeth, promethazine, sevelamer  Assessment/ Plan:   ESRD- hemodialysis MWF  ANEMIA-stable on ESA and Iron  MBD- calcium and PTH good  HTN/VOL-controlled ultrafiltrate fluid with dialysis  ACCESS-permcath will need vascular access planning  OTHER-leg ulcer and diabetes   LOS:  [name] W @TODAY @1 :04 PM

## 2012-01-05 NOTE — Progress Notes (Signed)
Pt's blood glucose was 600 mg/dL at 1610 on 9/60 when checked with the glucometer. Pt had a stat lab glucose level drawn to verify value. The result was 471. Pt's blood glucose was 389 at 0138 using the glucometer. Pt was given 9 units of Novolog insulin using the sliding scale.  Alfonso Ellis, RN

## 2012-01-05 NOTE — Progress Notes (Signed)
Pt. Discharged 01/05/2012  7:01 PM Discharge instructions reviewed with patient/family. Patient/family verbalized understanding. All Rx's given. Questions answered as needed. Pt. Discharged to home with family/self.  Annie Sable

## 2012-01-05 NOTE — Progress Notes (Signed)
   CARE MANAGEMENT NOTE 01/05/2012  Patient:  DAILY, DOE   Account Number:  [id number]  Date Initiated:  01/05/2012  Documentation initiated by:  [facility]  Subjective/Objective Assessment:   ESRD, anemia, PVD     Action/Plan:   Anticipated DC Date:  01/05/2012   Anticipated DC Plan:  HOME W HOME HEALTH SERVICES      DC Planning Services  CM consult      [facility] Choice  HOME HEALTH   Choice offered to / List presented to:  C-1 Patient        HH arranged  HH-2 PT  HH-1 RN      [facility] agency  [facility]   Status of service:  Completed, signed off Medicare Important Message given?   (If response is "NO", the following Medicare IM given date fields will be blank) Date Medicare IM given:   Date Additional Medicare IM given:    Discharge Disposition:  HOME W HOME HEALTH SERVICES  Per UR Regulation:    If discussed at Long Length of Stay Meetings, dates discussed:    Comments:  01/05/2012 1230 Spoke to pt and she uses Copy. Faxed referral to [facility] RN, and PT. Contacted [employer] and scheduled deliver of Nepro is for 01/07/2012. NCM instructed pt to contact [facility] to follow up. Isidoro Donning RN CCM Case Mgmt phone [phone]

## 2012-01-05 NOTE — Discharge Summary (Signed)
[facility] Discharge Summary   Patient ID:  [name] MRN: [id number] DOB/AGE: [birth year] 41 y.o.  Admit date: 12/28/2011 Discharge date: 01/05/2012 Date of Surgery: 12/28/2011 Surgeon: Surgeon(s): Nada Libman, MD  Admission Diagnosis: Peripheral Vascular Disease  Discharge Diagnoses:  Peripheral Vascular Disease  Secondary Diagnoses: Past Medical History  Diagnosis Date  . DVT (deep venous thrombosis)     DVT HISTORY  . Diabetes mellitus   . Migraine     HISTORY  . Anxiety and depression   . CAD (coronary artery disease)     Last cath 2009:  LAD stent 80% stenosis. Circumflex 60-70% stenosis AV groove, right coronary artery 50-60% stenosis. She did have cutting balloon angioplasty of the LAD lesion into a diagonal. However, there was restenosis of this and it was managed medically.  . Bipolar 1 disorder   . Hyperlipidemia   . Hyperparathyroidism   . PE (pulmonary embolism)     HISTORY  . Dialysis patient   . Anemia   . Renal failure     dialysis [place] t,th sat  . Nephrotic syndrome   . Peripheral vascular disease   . MRSA infection   . Anxiety   . Depression   . Gastroparesis   . Complication of anesthesia   . PONV (postoperative nausea and vomiting)     x3 days post anesth. on 12/19/2011  . Myocardial infarction     x3 , last one - 10 yrs. ago  . Hypertension     sees Dr. Antoine Poche - [place], last stress test 11/2011, see result in EPIC, saw Dr. Antoine Poche as well at that time    . Shortness of breath   . Recurrent upper respiratory infection (URI)     Bronchitis 11/2011 - saw Dr. Loney Hering, treating currently /w antibiotic   . Pneumonia     [facility]- 2012  . GERD (gastroesophageal reflux disease)     Procedure(s): AORTA BIFEMORAL BYPASS GRAFT BYPASS GRAFT FEMORAL-POPLITEAL ARTERY  Discharged Condition: good  HPI:  [name] is a 41 y.o. female  is back  for followup of her right lower the ulcers which have been present for several  months. She had a duplex studies which showed ABI of 0.15 on the right and 0.48 on the left. She underwent arteriogram which revealed right external iliac occlusion as well as bilateral superficial femoral artery occlusion. She is not a candidate for endovascular repair and therefore comes back today to discuss her surgical options. She has a history of left leg DVT. She is admitted for an aortobifemoral bypass and right fem-pop   Hospital Course:  [name] is a 41 y.o. female is S/P  Procedure(s): AORTA BIFEMORAL BYPASS GRAFT BYPASS GRAFT FEMORAL-POPLITEAL ARTERY - right Extubated: POD # 1 Post-op wounds healing well Pt. Ambulating,  and taking PO diet without difficulty. Pt pain controlled with PO pain meds. Labs as below Complications: prolonged ilieus - resolved UTI - 7 days of Zosyn complete  Consults:  Treatment Team:  Dyke Maes, MD  Significant Diagnostic Studies: CBC Lab Results  Component Value Date   WBC 7.4 01/04/2012   HGB 8.4* 01/04/2012   HCT 26.2* 01/04/2012   MCV 97.4 01/04/2012   PLT 189 01/04/2012    BMET    Component Value Date/Time   NA 136 01/04/2012 0806   K 4.3 01/04/2012 0806   CL 98 01/04/2012 0806   CO2 27 01/04/2012 0806   GLUCOSE 471* 01/05/2012 0039  BUN 14 01/04/2012 0806   CREATININE 2.63* 01/04/2012 0806   CREATININE 1.78* 10/17/2008 2000   CALCIUM 8.8 01/04/2012 0806   CALCIUM 8.3* 08/21/2011 0533   GFRNONAA 21* 01/04/2012 0806   GFRAA 25* 01/04/2012 0806   COAG Lab Results  Component Value Date   INR 1.14 12/28/2011   INR 1.07 12/24/2011   INR 0.96 12/20/2011     Disposition:  Discharge to :Home Discharge Orders    Future Appointments: Provider: Department: Dept Phone: Center:   01/14/2012 2:15 PM Malissa Hippo, MD [facility] December [phone] None     Future Orders Please Complete By Expires   Resume previous diet      Driving Restrictions      Comments:   No driving for 4 weeks   Lifting restrictions       Comments:   No lifting for 6 weeks   Call MD for:  temperature >100.5      Call MD for:  redness, tenderness, or signs of infection (pain, swelling, bleeding, redness, odor or green/yellow discharge around incision site)      Call MD for:  severe or increased pain, loss or decreased feeling  in affected limb(s)      Increase activity slowly      Comments:   Walk with assistance use walker or cane as needed   may wash over wound with mild soap and water      Discharge wound care:      Comments:   Hydrogel and wet to dry dressings daily to right leg wounds      Liany, [place]  Home Medication Instructions UJW:[id number]   Printed on:01/05/12 1101  Medication Information                    insulin lispro (HUMALOG) 100 UNIT/ML injection Inject 7-15 Units into the skin 4 (four) times daily -  before meals and at bedtime. TAKING ON SLIDING SCALE 200-249=7 units 250-299=10 units 300-349=12 units >350=15 units           amLODipine (NORVASC) 10 MG tablet Take 10 mg by mouth daily.            sodium bicarbonate 650 MG tablet Take 650 mg by mouth 2 (two) times daily.             sucralfate (CARAFATE) 1 G tablet Take 1 g by mouth 2 (two) times daily before a meal.            clopidogrel (PLAVIX) 75 MG tablet Take 75 mg by mouth daily. Last dose 12/17/2011           b complex-vitamin c-folic acid (NEPHRO-VITE) 0.8 MG TABS Take 0.8 mg by mouth at bedtime.            cloNIDine (CATAPRES) 0.2 MG tablet Take 0.2 mg by mouth 2 (two) times daily.            lisinopril (PRINIVIL,ZESTRIL) 40 MG tablet Take 40 mg by mouth daily.            PARoxetine (PAXIL) 40 MG tablet Take 80 mg by mouth every morning.            sevelamer (RENVELA) 800 MG tablet Take 1,600-4,000 mg by mouth 5 (five) times daily. *Take 5 tablets daily with each meal and take 2 tablets with snacks**           torsemide (DEMADEX) 100 MG tablet Take 100 mg by mouth daily.  albuterol (PROVENTIL  HFA;VENTOLIN HFA) 108 (90 BASE) MCG/ACT inhaler Inhale 2 puffs into the lungs every 6 (six) hours as needed. Shortness of breath            ARIPiprazole (ABILIFY) 2 MG tablet Take 2 mg by mouth daily.            cinacalcet (SENSIPAR) 30 MG tablet Take 30 mg by mouth daily with supper.            ALPRAZolam (XANAX) 1 MG tablet Take 1 tablet (1 mg total) by mouth 4 (four) times daily as needed for anxiety. For anxiety            Polyethyl Glycol-Propyl Glycol (SYSTANE ULTRA) 0.4-0.3 % SOLN Place 1 drop into both eyes 4 (four) times daily.           ofloxacin (OCUFLOX) 0.3 % ophthalmic solution Place 1 drop into the right eye 3 (three) times daily.            Artificial Tear Ointment (REFRESH P.M. OP) Place 1 application into the right eye 4 (four) times daily.            promethazine (PHENERGAN) 50 MG/ML injection Inject 50 mg into the muscle every 6 (six) hours as needed. For nausea           Eyelid Cleansers (SYSTANE LID WIPES) PADS Place 1 application into the right eye daily.           Olopatadine HCl (PATADAY) 0.2 % SOLN Apply 1 drop to eye daily.           travoprost, benzalkonium, (TRAVATAN) 0.004 % ophthalmic solution Place 1 drop into the right eye at bedtime.           temazepam (RESTORIL) 30 MG capsule Take 30 mg by mouth at bedtime.           traZODone (DESYREL) 100 MG tablet Take 200 mg by mouth at bedtime.           loteprednol (LOTEMAX) 0.5 % ophthalmic suspension Place 1 drop into the right eye 4 (four) times daily.           homatropine 5 % ophthalmic solution Place 1 drop into the right eye 3 (three) times daily.           omeprazole (PRILOSEC) 40 MG capsule Take 40 mg by mouth daily.            metoCLOPramide (REGLAN) 10 MG tablet Take 20 mg by mouth 4 (four) times daily.           promethazine (PHENERGAN) 25 MG tablet Take 25 mg by mouth every 6 (six) hours as needed. For nausea           doxycycline (VIBRA-TABS) 100 MG tablet Take 100 mg by  mouth daily.           PRESCRIPTION MEDICATION Place 1 application vaginally 2 (two) times daily. Pt uses this cream vaginally 2 times daily           insulin glargine (LANTUS) 100 UNIT/ML injection Inject 25 Units into the skin at bedtime.           nicotine (NICODERM CQ - DOSED IN MG/24 HOURS) 21 mg/24hr patch Place 1 patch onto the skin daily.           oxyCODONE-acetaminophen (PERCOCET) 10-325 MG per tablet Take 1 tablet by mouth every 4 (four) hours. For pain  wound dressings gel Apply topically daily. Hydrogel apply to RLE wounds daily            Verbal and written Discharge instructions given to the patient. Wound care per Discharge AVS Follow-up Information    Follow up with Myra Gianotti IV, Lala Lund, MD in 3 weeks. (office will arrange-sent)    Contact information:   [address] [place] [address] [phone]          Signed: Marlowe Shores 01/05/2012, 11:01 AM

## 2012-01-07 ENCOUNTER — Telehealth: Payer: Self-pay | Admitting: Surgery

## 2012-01-07 ENCOUNTER — Telehealth: Payer: Self-pay

## 2012-01-07 ENCOUNTER — Other Ambulatory Visit: Payer: Self-pay | Admitting: *Deleted

## 2012-01-07 DIAGNOSIS — I7092 Chronic total occlusion of artery of the extremities: Secondary | ICD-10-CM

## 2012-01-07 DIAGNOSIS — Z48812 Encounter for surgical aftercare following surgery on the circulatory system: Secondary | ICD-10-CM

## 2012-01-07 NOTE — Telephone Encounter (Signed)
Informed Dr. Myra Gianotti of pt's symptoms and that nausea/ vomiting has subsided at this time.  No further orders given.

## 2012-01-07 NOTE — Discharge Summary (Signed)
Agree with above  [name]

## 2012-01-07 NOTE — Telephone Encounter (Addendum)
Message copied by Shari Prows on Mon Jan 07, 2012 10:40 AM ------      Message from: Melene Plan      Created: Mon Jan 07, 2012 10:23 AM                   ----- Message -----         From: Marlowe Shores, [place]         Sent: 01/05/2012  10:24 AM           To: Melene Plan, RN            F/U 3 weeks - [place] - Aorto bifem with ABI's Scheduled appt for pt on Mon 01/28/12 at 2:30pm abi's and 3:15pm with VWB. Notified caregiver Merla Riches of appt time/date.Also mailed letter. Jacklyn Shell

## 2012-01-07 NOTE — Telephone Encounter (Signed)
Returned phone call at approx. 3:30 pm to check on pt's status re: nausea/vomiting.  Spoke with pt's home health aide, Tammy.  Stated pt. Ate approx. 1/3 bowl of broth, and drank some Coke.  Stated pt. Is out for a ride with her sister, to get some fresh air.  States no further vomiting.  Encouraged pt. To drink fluids to rehydrate.  Nurses aide stated pt. Is restricted to 32 oz. Of liquids/ day due to kidney disease.  Advised nurse aide to inform pt. To call office or go to [facility] ER if her symptoms worsen.  Verb. Understanding.  Will make Dr. Myra Gianotti aware.

## 2012-01-07 NOTE — Telephone Encounter (Signed)
Phone call from pt's sister to report "pt.vomited all day Sat. and Sunday". Last episode of vomiting was at 1:00 am 5/13.  States pt. has only taken sips of water today, as of 1:45 pm.  Sister stated pt did take Xanax and pain pill this morning, but hasn't eaten anything.   States bowels moved and is passing gas.  Encouraged sister to have pt. Eat plain broth or dry toast or crackers, to check if she will be able to keep food down.  Nurse will call back in approx. one hr. to check on pt's. status.

## 2012-01-08 LAB — STOOL CULTURE

## 2012-01-10 IMAGING — XA IR US GUIDE VASC ACCESS LEFT
1 series · 2 of 2 positions shown · non-contrast
Comparison: none

Clinical Data/Indication: RENAL FAILURE

TUNNELED DIALYSIS CATHETER PLACEMENT, ULTRASOUND GUIDANCE FOR
VASCULAR ACCESS
Sedation: Versed three mg, Fentanyl 100 mcg.
Total Moderate Sedation Time: 15 minutes.
Fluoroscopy Time: 1.1 minutes.
Procedure: The procedure, risks, benefits, and alternatives were
explained to the patient. Questions regarding the procedure were
encouraged and answered. The patient understands and consents to
the procedure.
The left neck was prepped with betadine in a sterile fashion, and a
sterile drape was applied covering the operative field. A sterile
gown and sterile gloves were used for the procedure. 1% lidocaine
into the skin and subcutaneous tissue.  The left internal jugular
vein was noted to be patent initially with ultrasound.  Under
sonographic guidance, a micropuncture needle was inserted into the
left IJ vein (Ultrasound and fluoroscopic image documentation was
performed). It was removed over an 018 wire which was upsized to an
Amplatz.  This was advanced into the IVC.
A small incision was made in the left upper chest.  The tunneling
device was utilized to advance the 27 centimeter tip to cuff
catheter from the chest incision and out the neck incision.  A peel-
away sheath was advanced over the Amplatz wire.  The leading edge
of the catheter was then advanced through the peel-away sheath.
The peel-away sheath was removed.  It was flushed and instilled
with heparin.  The chest incision was closed with a 0 Prolene
pursestring stitch.  The neck incision was closed with a 4-0 Vicryl
subcuticular stitch.  No complications.

[Series 1: run · 2 of 2 slices shown]
[im 1/2]
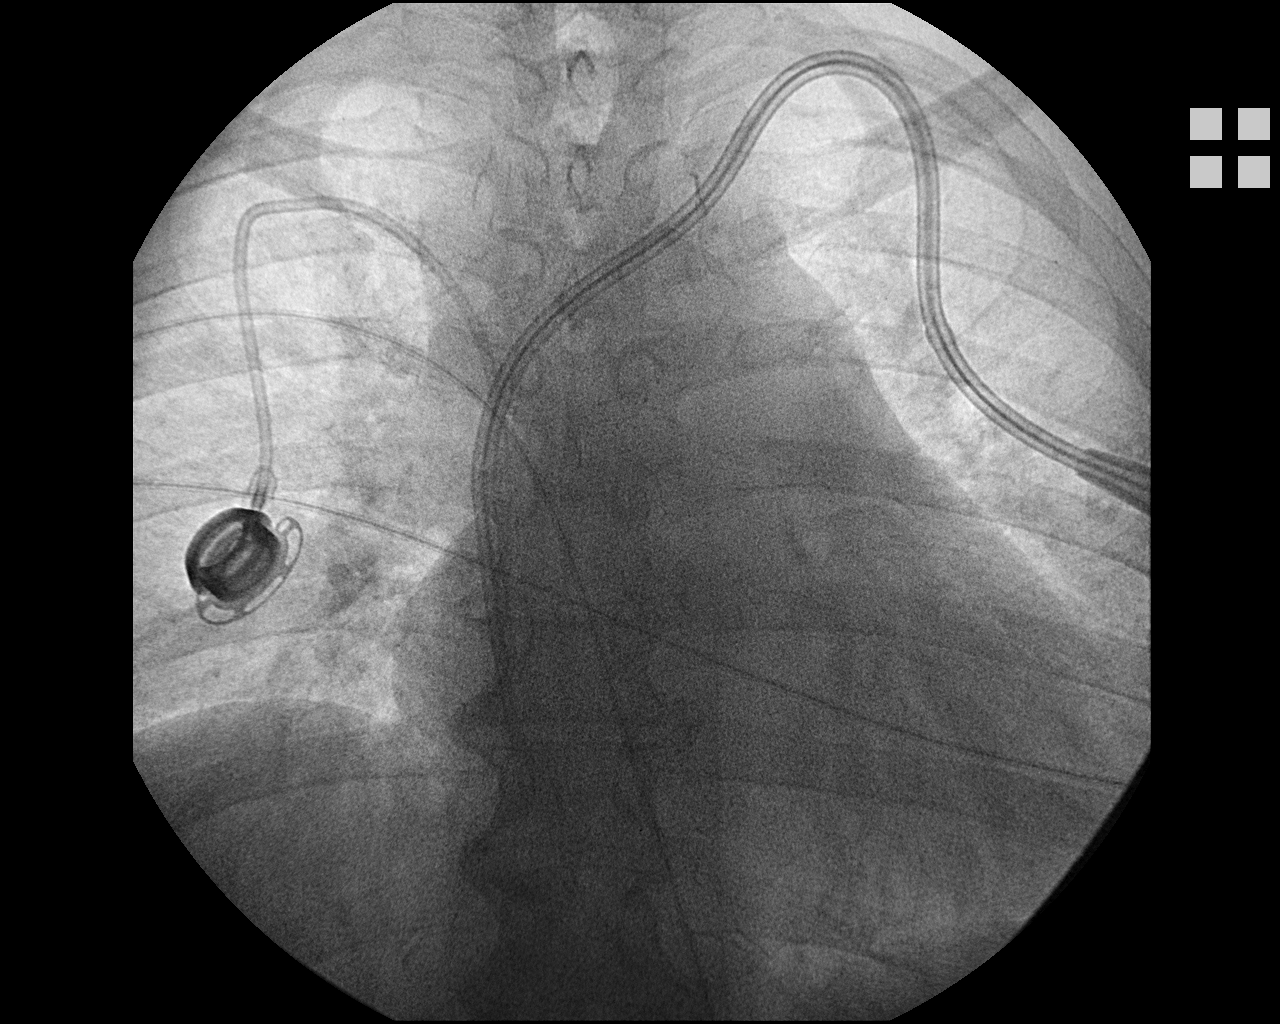
[im 2/2]
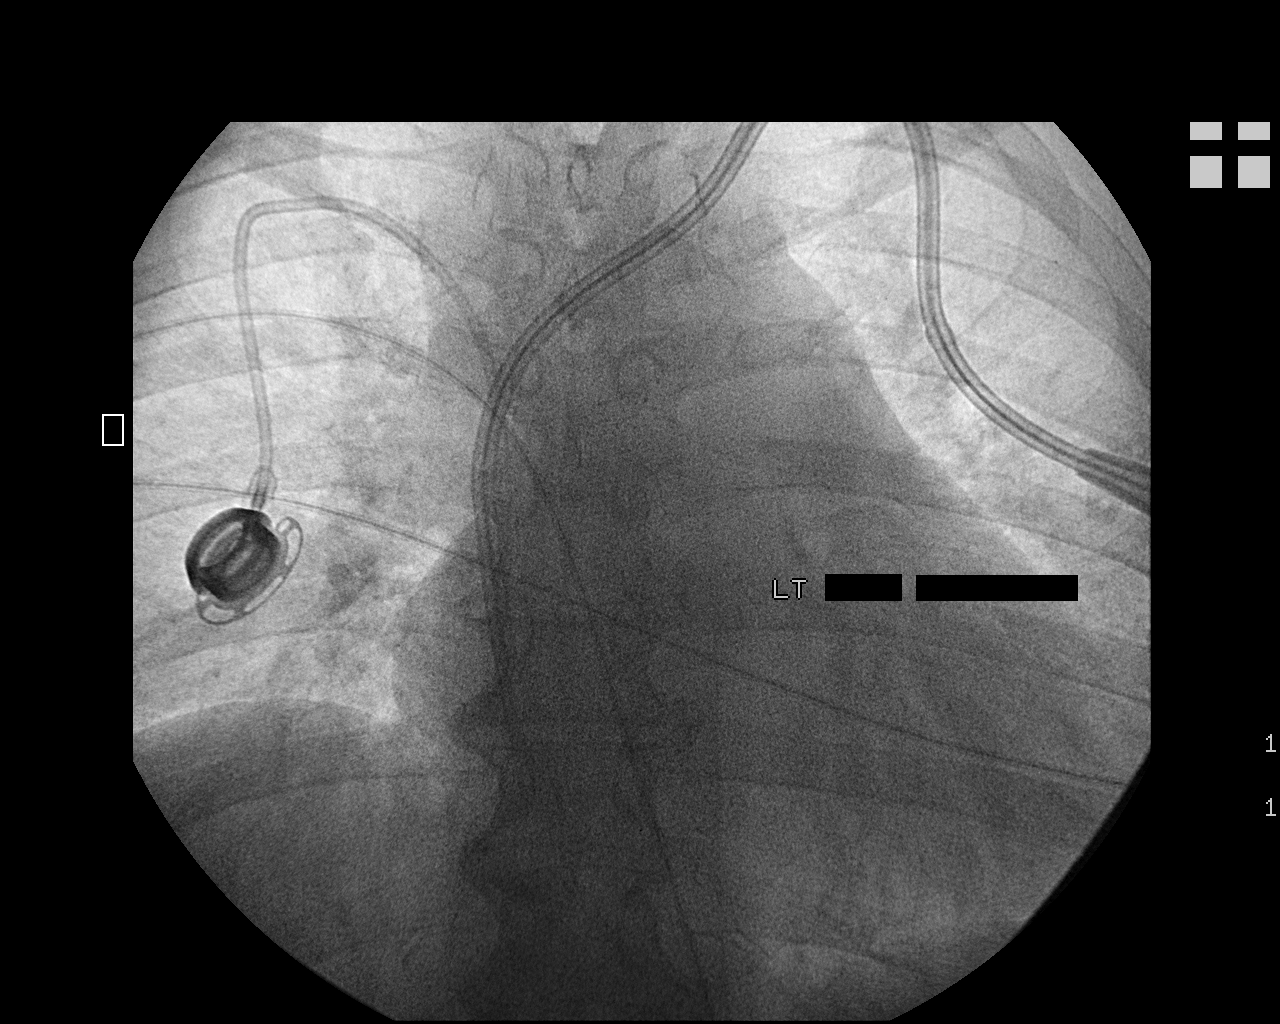

[2 of 2 positions shown; findings below may reference images not displayed]

FINDINGS: The image demonstrates placement of a tunneled dialysis
catheter with its tip in the right atrium.
IMPRESSION: Successful left IJ vein tunneled dialysis catheter with its tip in
the right atrium.

## 2012-01-14 ENCOUNTER — Encounter (HOSPITAL_COMMUNITY): Payer: Self-pay | Admitting: *Deleted

## 2012-01-14 ENCOUNTER — Ambulatory Visit (INDEPENDENT_AMBULATORY_CARE_PROVIDER_SITE_OTHER): Payer: Medicare Other | Admitting: Internal Medicine

## 2012-01-14 ENCOUNTER — Inpatient Hospital Stay (HOSPITAL_COMMUNITY)
Admission: EM | Admit: 2012-01-14 | Discharge: 2012-01-22 | DRG: 602 | Disposition: A | Discharge status: Home / Self Care | Payer: Medicare Other | Source: Ambulatory Visit | Attending: Surgery | Admitting: Surgery

## 2012-01-14 DIAGNOSIS — L03115 Cellulitis of right lower limb: Secondary | ICD-10-CM

## 2012-01-14 DIAGNOSIS — Z7902 Long term (current) use of antithrombotics/antiplatelets: Secondary | ICD-10-CM

## 2012-01-14 DIAGNOSIS — E669 Obesity, unspecified: Secondary | ICD-10-CM | POA: Diagnosis present

## 2012-01-14 DIAGNOSIS — Z9115 Patient's noncompliance with renal dialysis: Secondary | ICD-10-CM

## 2012-01-14 DIAGNOSIS — Z794 Long term (current) use of insulin: Secondary | ICD-10-CM

## 2012-01-14 DIAGNOSIS — F341 Dysthymic disorder: Secondary | ICD-10-CM | POA: Diagnosis present

## 2012-01-14 DIAGNOSIS — Z91041 Radiographic dye allergy status: Secondary | ICD-10-CM

## 2012-01-14 DIAGNOSIS — E119 Type 2 diabetes mellitus without complications: Secondary | ICD-10-CM | POA: Diagnosis present

## 2012-01-14 DIAGNOSIS — L02419 Cutaneous abscess of limb, unspecified: Principal | ICD-10-CM | POA: Diagnosis present

## 2012-01-14 DIAGNOSIS — I120 Hypertensive chronic kidney disease with stage 5 chronic kidney disease or end stage renal disease: Secondary | ICD-10-CM | POA: Diagnosis present

## 2012-01-14 DIAGNOSIS — Z8614 Personal history of Methicillin resistant Staphylococcus aureus infection: Secondary | ICD-10-CM

## 2012-01-14 DIAGNOSIS — F319 Bipolar disorder, unspecified: Secondary | ICD-10-CM | POA: Diagnosis present

## 2012-01-14 DIAGNOSIS — I739 Peripheral vascular disease, unspecified: Secondary | ICD-10-CM

## 2012-01-14 DIAGNOSIS — I252 Old myocardial infarction: Secondary | ICD-10-CM

## 2012-01-14 DIAGNOSIS — Z992 Dependence on renal dialysis: Secondary | ICD-10-CM

## 2012-01-14 DIAGNOSIS — F17200 Nicotine dependence, unspecified, uncomplicated: Secondary | ICD-10-CM | POA: Diagnosis present

## 2012-01-14 DIAGNOSIS — N186 End stage renal disease: Secondary | ICD-10-CM

## 2012-01-14 DIAGNOSIS — I70209 Unspecified atherosclerosis of native arteries of extremities, unspecified extremity: Secondary | ICD-10-CM | POA: Diagnosis present

## 2012-01-14 DIAGNOSIS — Z79899 Other long term (current) drug therapy: Secondary | ICD-10-CM

## 2012-01-14 DIAGNOSIS — Z888 Allergy status to other drugs, medicaments and biological substances status: Secondary | ICD-10-CM

## 2012-01-14 DIAGNOSIS — Z86711 Personal history of pulmonary embolism: Secondary | ICD-10-CM

## 2012-01-14 DIAGNOSIS — E785 Hyperlipidemia, unspecified: Secondary | ICD-10-CM | POA: Diagnosis present

## 2012-01-14 DIAGNOSIS — N039 Chronic nephritic syndrome with unspecified morphologic changes: Secondary | ICD-10-CM | POA: Diagnosis present

## 2012-01-14 DIAGNOSIS — Z86718 Personal history of other venous thrombosis and embolism: Secondary | ICD-10-CM

## 2012-01-14 DIAGNOSIS — Z9861 Coronary angioplasty status: Secondary | ICD-10-CM

## 2012-01-14 DIAGNOSIS — I2510 Atherosclerotic heart disease of native coronary artery without angina pectoris: Secondary | ICD-10-CM | POA: Diagnosis present

## 2012-01-14 DIAGNOSIS — Z91158 Patient's noncompliance with renal dialysis for other reason: Secondary | ICD-10-CM

## 2012-01-14 DIAGNOSIS — L539 Erythematous condition, unspecified: Secondary | ICD-10-CM

## 2012-01-14 DIAGNOSIS — K219 Gastro-esophageal reflux disease without esophagitis: Secondary | ICD-10-CM | POA: Diagnosis present

## 2012-01-14 DIAGNOSIS — D631 Anemia in chronic kidney disease: Secondary | ICD-10-CM | POA: Diagnosis present

## 2012-01-14 LAB — DIFFERENTIAL
Basophils Absolute: 0.1 10*3/uL (ref 0.0–0.1)
Basophils Relative: 0 % (ref 0–1)
Eosinophils Absolute: 0.2 10*3/uL (ref 0.0–0.7)
Eosinophils Relative: 2 % (ref 0–5)
Lymphocytes Relative: 10 % — ABNORMAL LOW (ref 12–46)
Lymphs Abs: 1.2 K/uL (ref 0.7–4.0)
Monocytes Absolute: 0.5 10*3/uL (ref 0.1–1.0)
Monocytes Relative: 4 % (ref 3–12)
Neutro Abs: 10.6 10*3/uL — ABNORMAL HIGH (ref 1.7–7.7)
Neutrophils Relative %: 84 % — ABNORMAL HIGH (ref 43–77)

## 2012-01-14 LAB — BASIC METABOLIC PANEL
BUN: 39 mg/dL — ABNORMAL HIGH (ref 6–23)
Calcium: 9.7 mg/dL (ref 8.4–10.5)
Chloride: 88 mEq/L — ABNORMAL LOW (ref 96–112)
Creatinine, Ser: 4.48 mg/dL — ABNORMAL HIGH (ref 0.50–1.10)
GFR calc Af Amer: 13 mL/min — ABNORMAL LOW (ref >90)

## 2012-01-14 LAB — CBC
HCT: 25.4 % — ABNORMAL LOW (ref 36.0–46.0)
HCT: 26 % — ABNORMAL LOW (ref 36.0–46.0)
Hemoglobin: 8.1 g/dL — ABNORMAL LOW (ref 12.0–15.0)
Hemoglobin: 8.4 g/dL — ABNORMAL LOW (ref 12.0–15.0)
MCH: 31.6 pg (ref 26.0–34.0)
MCHC: 31.9 g/dL (ref 30.0–36.0)
MCV: 99.2 fL (ref 78.0–100.0)
Platelets: 271 K/uL (ref 150–400)
RBC: 2.56 MIL/uL — ABNORMAL LOW (ref 3.87–5.11)
RBC: 2.62 MIL/uL — ABNORMAL LOW (ref 3.87–5.11)
RDW: 19.5 % — ABNORMAL HIGH (ref 11.5–15.5)
WBC: 12.6 K/uL — ABNORMAL HIGH (ref 4.0–10.5)
WBC: 11.9 10*3/uL — ABNORMAL HIGH (ref 4.0–10.5)

## 2012-01-14 LAB — BASIC METABOLIC PANEL WITH GFR
CO2: 20 meq/L (ref 19–32)
GFR calc non Af Amer: 11 mL/min — ABNORMAL LOW (ref >90)
Glucose, Bld: 286 mg/dL — ABNORMAL HIGH (ref 70–99)
Potassium: 3.2 meq/L — ABNORMAL LOW (ref 3.5–5.1)
Sodium: 128 meq/L — ABNORMAL LOW (ref 135–145)

## 2012-01-14 LAB — CREATININE, SERUM
GFR calc Af Amer: 13 mL/min — ABNORMAL LOW (ref >90)
GFR calc non Af Amer: 11 mL/min — ABNORMAL LOW (ref >90)

## 2012-01-14 LAB — GLUCOSE, CAPILLARY: Glucose-Capillary: 174 mg/dL — ABNORMAL HIGH (ref 70–99)

## 2012-01-14 MED ORDER — INSULIN ASPART 100 UNIT/ML ~~LOC~~ SOLN
7.0000 [IU] | Freq: Three times a day (TID) | SUBCUTANEOUS | Status: DC
  Administered 2012-01-17 – 2012-01-18 (×2): 7 [IU] via SUBCUTANEOUS
  Administered 2012-01-19 – 2012-01-20 (×2): 12 [IU] via SUBCUTANEOUS
  Administered 2012-01-20: 10 [IU] via SUBCUTANEOUS

## 2012-01-14 MED ORDER — OXYCODONE-ACETAMINOPHEN 5-325 MG PO TABS
1.0000 | ORAL_TABLET | ORAL | Status: DC
  Administered 2012-01-15 – 2012-01-22 (×37): 1 via ORAL
  Filled 2012-01-14 (×37): qty 1

## 2012-01-14 MED ORDER — METOCLOPRAMIDE HCL 10 MG PO TABS
20.0000 mg | ORAL_TABLET | Freq: Four times a day (QID) | ORAL | Status: DC
  Administered 2012-01-14 – 2012-01-22 (×28): 20 mg via ORAL
  Filled 2012-01-14 (×33): qty 2

## 2012-01-14 MED ORDER — SYSTANE LID WIPES EX PADS: 1.0000 "application " | MEDICATED_PAD | Freq: Every day | CUTANEOUS | Status: DC

## 2012-01-14 MED ORDER — BUTALBITAL-APAP-CAFFEINE 50-325-40 MG PO TABS
1.0000 | ORAL_TABLET | Freq: Three times a day (TID) | ORAL | Status: DC | PRN
  Administered 2012-01-14 – 2012-01-19 (×4): 1 via ORAL
  Filled 2012-01-14 (×4): qty 1

## 2012-01-14 MED ORDER — CLONIDINE HCL 0.2 MG PO TABS
0.2000 mg | ORAL_TABLET | Freq: Two times a day (BID) | ORAL | Status: DC
  Administered 2012-01-14: 0.2 mg via ORAL
  Filled 2012-01-14 (×3): qty 1

## 2012-01-14 MED ORDER — VANCOMYCIN HCL IN DEXTROSE 1-5 GM/200ML-% IV SOLN
1000.0000 mg | Freq: Once | INTRAVENOUS | Status: AC
  Administered 2012-01-14: 1000 mg via INTRAVENOUS
  Filled 2012-01-14: qty 200

## 2012-01-14 MED ORDER — SODIUM CHLORIDE 0.9 % IJ SOLN
3.0000 mL | Freq: Two times a day (BID) | INTRAMUSCULAR | Status: DC
  Administered 2012-01-14 – 2012-01-17 (×7): 3 mL via INTRAVENOUS
  Administered 2012-01-18: 21:00:00 via INTRAVENOUS
  Administered 2012-01-18 – 2012-01-22 (×6): 3 mL via INTRAVENOUS

## 2012-01-14 MED ORDER — HOMATROPINE HBR 5 % OP SOLN
1.0000 [drp] | Freq: Three times a day (TID) | OPHTHALMIC | Status: DC
  Administered 2012-01-17 – 2012-01-22 (×16): 1 [drp] via OPHTHALMIC
  Filled 2012-01-14 (×2): qty 5

## 2012-01-14 MED ORDER — VANCOMYCIN HCL 1000 MG IV SOLR
750.0000 mg | INTRAVENOUS | Status: AC
  Administered 2012-01-14: 750 mg via INTRAVENOUS
  Filled 2012-01-14: qty 750

## 2012-01-14 MED ORDER — POLYETHYL GLYCOL-PROPYL GLYCOL 0.4-0.3 % OP SOLN: 1.0000 [drp] | Freq: Four times a day (QID) | OPHTHALMIC | Status: DC

## 2012-01-14 MED ORDER — FUROSEMIDE 20 MG PO TABS
20.0000 mg | ORAL_TABLET | Freq: Every day | ORAL | Status: DC | PRN
  Filled 2012-01-14: qty 2

## 2012-01-14 MED ORDER — ENOXAPARIN SODIUM 30 MG/0.3ML ~~LOC~~ SOLN
30.0000 mg | SUBCUTANEOUS | Status: DC
  Administered 2012-01-14 – 2012-01-21 (×8): 30 mg via SUBCUTANEOUS
  Filled 2012-01-14 (×9): qty 0.3

## 2012-01-14 MED ORDER — HYDROCHLOROTHIAZIDE 12.5 MG PO CAPS
12.5000 mg | ORAL_CAPSULE | Freq: Every day | ORAL | Status: DC
  Filled 2012-01-14 (×2): qty 1

## 2012-01-14 MED ORDER — PROMETHAZINE HCL 25 MG/ML IJ SOLN
50.0000 mg | Freq: Four times a day (QID) | INTRAMUSCULAR | Status: DC | PRN
  Administered 2012-01-19: 50 mg via INTRAMUSCULAR
  Filled 2012-01-14: qty 1

## 2012-01-14 MED ORDER — INSULIN LISPRO 100 UNIT/ML ~~LOC~~ SOLN
7.0000 [IU] | Freq: Three times a day (TID) | SUBCUTANEOUS | Status: DC
  Filled 2012-01-14: qty 3

## 2012-01-14 MED ORDER — SEVELAMER CARBONATE 800 MG PO TABS
1600.0000 mg | ORAL_TABLET | Freq: Two times a day (BID) | ORAL | Status: DC | PRN
  Filled 2012-01-14: qty 2

## 2012-01-14 MED ORDER — ALBUTEROL SULFATE HFA 108 (90 BASE) MCG/ACT IN AERS: 2.0000 | INHALATION_SPRAY | Freq: Four times a day (QID) | RESPIRATORY_TRACT | Status: DC | PRN

## 2012-01-14 MED ORDER — TEMAZEPAM 15 MG PO CAPS
30.0000 mg | ORAL_CAPSULE | Freq: Every day | ORAL | Status: DC
  Administered 2012-01-14 – 2012-01-21 (×8): 30 mg via ORAL
  Filled 2012-01-14: qty 1
  Filled 2012-01-14 (×3): qty 2
  Filled 2012-01-14: qty 1
  Filled 2012-01-14 (×3): qty 2
  Filled 2012-01-14 (×2): qty 1

## 2012-01-14 MED ORDER — VITAMIN B-6 100 MG PO TABS
100.0000 mg | ORAL_TABLET | Freq: Every day | ORAL | Status: DC
  Administered 2012-01-15 – 2012-01-22 (×8): 100 mg via ORAL
  Filled 2012-01-14 (×10): qty 1

## 2012-01-14 MED ORDER — ALPRAZOLAM 0.5 MG PO TABS
1.0000 mg | ORAL_TABLET | Freq: Four times a day (QID) | ORAL | Status: DC | PRN
  Administered 2012-01-15 – 2012-01-22 (×9): 1 mg via ORAL
  Filled 2012-01-14 (×3): qty 2
  Filled 2012-01-14: qty 1
  Filled 2012-01-14 (×3): qty 2
  Filled 2012-01-14: qty 1

## 2012-01-14 MED ORDER — IRBESARTAN 300 MG PO TABS
300.0000 mg | ORAL_TABLET | Freq: Every day | ORAL | Status: DC
  Administered 2012-01-15: 300 mg via ORAL
  Filled 2012-01-14 (×2): qty 1

## 2012-01-14 MED ORDER — SODIUM BICARBONATE 650 MG PO TABS
650.0000 mg | ORAL_TABLET | Freq: Two times a day (BID) | ORAL | Status: DC
  Administered 2012-01-14 – 2012-01-21 (×13): 650 mg via ORAL
  Filled 2012-01-14 (×15): qty 1

## 2012-01-14 MED ORDER — TRAZODONE HCL 150 MG PO TABS
300.0000 mg | ORAL_TABLET | Freq: Every day | ORAL | Status: DC
  Administered 2012-01-14 – 2012-01-21 (×8): 300 mg via ORAL
  Filled 2012-01-14 (×9): qty 2

## 2012-01-14 MED ORDER — VANCOMYCIN HCL IN DEXTROSE 1-5 GM/200ML-% IV SOLN
1000.0000 mg | INTRAVENOUS | Status: DC
  Administered 2012-01-15 – 2012-01-22 (×4): 1000 mg via INTRAVENOUS
  Filled 2012-01-14 (×8): qty 200

## 2012-01-14 MED ORDER — SUCRALFATE 1 G PO TABS
1.0000 g | ORAL_TABLET | Freq: Two times a day (BID) | ORAL | Status: DC
  Administered 2012-01-15 – 2012-01-21 (×11): 1 g via ORAL
  Filled 2012-01-14 (×16): qty 1

## 2012-01-14 MED ORDER — PHENOL 1.4 % MT LIQD
1.0000 | OROMUCOSAL | Status: DC | PRN
  Filled 2012-01-14: qty 177

## 2012-01-14 MED ORDER — OFLOXACIN 0.3 % OP SOLN
1.0000 [drp] | Freq: Three times a day (TID) | OPHTHALMIC | Status: DC
  Administered 2012-01-14 – 2012-01-22 (×22): 1 [drp] via OPHTHALMIC
  Filled 2012-01-14 (×2): qty 5

## 2012-01-14 MED ORDER — ONDANSETRON HCL 4 MG/2ML IJ SOLN
4.0000 mg | Freq: Four times a day (QID) | INTRAMUSCULAR | Status: DC | PRN
  Administered 2012-01-17 – 2012-01-21 (×5): 4 mg via INTRAVENOUS
  Filled 2012-01-14 (×5): qty 2

## 2012-01-14 MED ORDER — HYDRALAZINE HCL 20 MG/ML IJ SOLN
10.0000 mg | INTRAMUSCULAR | Status: DC | PRN
  Filled 2012-01-14: qty 0.5

## 2012-01-14 MED ORDER — INSULIN GLARGINE 100 UNIT/ML ~~LOC~~ SOLN
30.0000 [IU] | Freq: Every day | SUBCUTANEOUS | Status: DC
  Administered 2012-01-14 – 2012-01-21 (×8): 30 [IU] via SUBCUTANEOUS

## 2012-01-14 MED ORDER — POLYVINYL ALCOHOL 1.4 % OP SOLN
1.0000 [drp] | Freq: Four times a day (QID) | OPHTHALMIC | Status: DC
  Administered 2012-01-14 – 2012-01-22 (×27): 1 [drp] via OPHTHALMIC
  Filled 2012-01-14 (×2): qty 15

## 2012-01-14 MED ORDER — ARIPIPRAZOLE 2 MG PO TABS
2.0000 mg | ORAL_TABLET | Freq: Every day | ORAL | Status: DC
  Administered 2012-01-15 – 2012-01-22 (×8): 2 mg via ORAL
  Filled 2012-01-14 (×10): qty 1

## 2012-01-14 MED ORDER — METHOCARBAMOL 500 MG PO TABS
1000.0000 mg | ORAL_TABLET | Freq: Four times a day (QID) | ORAL | Status: DC | PRN
  Filled 2012-01-14: qty 2

## 2012-01-14 MED ORDER — PROMETHAZINE HCL 25 MG PO TABS
25.0000 mg | ORAL_TABLET | Freq: Four times a day (QID) | ORAL | Status: DC | PRN
  Administered 2012-01-17 – 2012-01-18 (×2): 25 mg via ORAL
  Filled 2012-01-14 (×2): qty 1

## 2012-01-14 MED ORDER — METOPROLOL TARTRATE 25 MG PO TABS
25.0000 mg | ORAL_TABLET | Freq: Two times a day (BID) | ORAL | Status: DC
  Administered 2012-01-14: 25 mg via ORAL
  Filled 2012-01-14 (×3): qty 1

## 2012-01-14 MED ORDER — SEVELAMER CARBONATE 800 MG PO TABS
4000.0000 mg | ORAL_TABLET | Freq: Three times a day (TID) | ORAL | Status: DC
  Administered 2012-01-15 – 2012-01-22 (×13): 4000 mg via ORAL
  Filled 2012-01-14 (×25): qty 5

## 2012-01-14 MED ORDER — OXYCODONE-ACETAMINOPHEN 10-325 MG PO TABS
1.0000 | ORAL_TABLET | ORAL | Status: DC
  Filled 2012-01-14 (×4): qty 1

## 2012-01-14 MED ORDER — LABETALOL HCL 5 MG/ML IV SOLN
10.0000 mg | INTRAVENOUS | Status: DC | PRN
  Filled 2012-01-14: qty 4

## 2012-01-14 MED ORDER — LISINOPRIL 40 MG PO TABS
40.0000 mg | ORAL_TABLET | Freq: Every day | ORAL | Status: DC
  Filled 2012-01-14 (×2): qty 1

## 2012-01-14 MED ORDER — GUAIFENESIN-DM 100-10 MG/5ML PO SYRP
15.0000 mL | ORAL_SOLUTION | ORAL | Status: DC | PRN
  Filled 2012-01-14: qty 15

## 2012-01-14 MED ORDER — AMLODIPINE BESYLATE 10 MG PO TABS
10.0000 mg | ORAL_TABLET | Freq: Every day | ORAL | Status: DC
  Filled 2012-01-14 (×2): qty 1

## 2012-01-14 MED ORDER — SEVELAMER CARBONATE 800 MG PO TABS: 1600.0000 mg | ORAL_TABLET | Freq: Every day | ORAL | Status: DC

## 2012-01-14 MED ORDER — TORSEMIDE 100 MG PO TABS
100.0000 mg | ORAL_TABLET | Freq: Every day | ORAL | Status: DC
  Administered 2012-01-15: 100 mg via ORAL
  Filled 2012-01-14 (×2): qty 1

## 2012-01-14 MED ORDER — CINACALCET HCL 30 MG PO TABS
30.0000 mg | ORAL_TABLET | Freq: Every day | ORAL | Status: DC
  Administered 2012-01-15 – 2012-01-21 (×7): 30 mg via ORAL
  Filled 2012-01-14 (×8): qty 1

## 2012-01-14 MED ORDER — HOMATROPINE HBR 5 % OP SOLN
1.0000 [drp] | Freq: Three times a day (TID) | OPHTHALMIC | Status: DC
  Filled 2012-01-14: qty 5

## 2012-01-14 MED ORDER — PANTOPRAZOLE SODIUM 40 MG PO TBEC
40.0000 mg | DELAYED_RELEASE_TABLET | Freq: Every day | ORAL | Status: DC
  Administered 2012-01-15 – 2012-01-22 (×6): 40 mg via ORAL
  Filled 2012-01-14 (×8): qty 1

## 2012-01-14 MED ORDER — VALSARTAN-HYDROCHLOROTHIAZIDE 320-12.5 MG PO TABS: 1.0000 | ORAL_TABLET | Freq: Every day | ORAL | Status: DC

## 2012-01-14 MED ORDER — PAROXETINE HCL 30 MG PO TABS
80.0000 mg | ORAL_TABLET | Freq: Every day | ORAL | Status: DC
  Administered 2012-01-15 – 2012-01-22 (×8): 80 mg via ORAL
  Filled 2012-01-14 (×9): qty 1

## 2012-01-14 MED ORDER — FENTANYL CITRATE 0.05 MG/ML IJ SOLN
100.0000 ug | Freq: Once | INTRAMUSCULAR | Status: AC
  Administered 2012-01-14: 100 ug via INTRAVENOUS
  Filled 2012-01-14: qty 2

## 2012-01-14 MED ORDER — CLOPIDOGREL BISULFATE 75 MG PO TABS
75.0000 mg | ORAL_TABLET | Freq: Every day | ORAL | Status: DC
  Administered 2012-01-15 – 2012-01-22 (×8): 75 mg via ORAL
  Filled 2012-01-14 (×9): qty 1

## 2012-01-14 MED ORDER — OXYCODONE HCL 5 MG PO TABS
5.0000 mg | ORAL_TABLET | ORAL | Status: DC
  Administered 2012-01-15 – 2012-01-22 (×35): 5 mg via ORAL
  Filled 2012-01-14 (×36): qty 1

## 2012-01-14 NOTE — H&P (Signed)
[facility] History and Physical    Patient name: [name] MRN: [id number] DOB: 20-Feb-1971 Sex: female       HISTORY OF PRESENT ILLNESS: The patient is a 41 year old female with extensive past medical history. She has severe peripheral vascular occlusive disease and is status post aortobifemoral bypass and right femoral to popliteal bypass above the knee with Gore-Tex graft by Dr. Myra Gianotti on 12/28/2011. She does have chronic renal failure and had her last hemodialysis 2 days ago. She presents to the emergency room this evening with worsening bilateral lower extremity swelling. He does have significant erythema in her right leg. Her aortofemoral bypass had been for nonhealing ulcers on her right lower extremity. She does have a history of MRSA.  Past Medical History  Diagnosis Date  . DVT (deep venous thrombosis)     DVT HISTORY  . Diabetes mellitus   . Migraine     HISTORY  . Anxiety and depression   . CAD (coronary artery disease)     Last cath 2009:  LAD stent 80% stenosis. Circumflex 60-70% stenosis AV groove, right coronary artery 50-60% stenosis. She did have cutting balloon angioplasty of the LAD lesion into a diagonal. However, there was restenosis of this and it was managed medically.  . Bipolar 1 disorder   . Hyperlipidemia   . Hyperparathyroidism   . PE (pulmonary embolism)     HISTORY  . Dialysis patient   . Anemia   . Renal failure     dialysis [place] t,th sat  . Nephrotic syndrome   . Peripheral vascular disease   . MRSA infection   . Anxiety   . Depression   . Gastroparesis   . Complication of anesthesia   . PONV (postoperative nausea and vomiting)     x3 days post anesth. on 12/19/2011  . Myocardial infarction     x3 , last one - 10 yrs. ago  . Hypertension     sees Dr. Antoine Poche - [place], last stress test 11/2011, see result in EPIC, saw Dr. Antoine Poche as well at that time    . Shortness of breath   . Recurrent upper  respiratory infection (URI)     Bronchitis 11/2011 - saw Dr. Loney Hering, treating currently /w antibiotic   . Pneumonia     [facility]- 2012  . GERD (gastroesophageal reflux disease)     Past Surgical History  Procedure Date  . Incise and drain abcess     OF THIGHS FROM INSULIN INJECTIONS  . Heart stents   . Portacath placement 2003  . Cataract extraction w/phaco 11/09/2011    Procedure: CATARACT EXTRACTION PHACO AND INTRAOCULAR LENS PLACEMENT (IOC);  Surgeon: Edmon Crape, MD;  Location: [facility] OR;  Service: Ophthalmology;  Laterality: Right;  . Pars plana vitrectomy 11/09/2011    Procedure: PARS PLANA VITRECTOMY WITH 23 GAUGE;  Surgeon: Edmon Crape, MD;  Location: [facility] OR;  Service: Ophthalmology;  Laterality: Right;  Ahmed Valve; Scleral Reinforcement Graft  . Dg av dialysis  shunt access exist*l* or 12/13/11    left arm  . Dialysis cath inserted & portacath      dialysis catheter inserted & portacath d/c'd- 12/19/2011  . Back surgery X 4    x4 times   . Cholecystectomy   . Eye surgery   . Cardiac catheterization   . Aorta - bilateral femoral artery bypass graft 12/28/2011    Procedure: AORTA BIFEMORAL BYPASS GRAFT;  Surgeon: Nada Libman, MD;  Location: [facility] OR;  Service: Vascular;  Laterality: N/A;  AortoBifemoral Bypass using hemasheild 14x7 graft  . Femoral-popliteal bypass graft 12/28/2011    Procedure: BYPASS GRAFT FEMORAL-POPLITEAL ARTERY;  Surgeon: Nada Libman, MD;  Location: [facility] OR;  Service: Vascular;  Laterality: Right;  right femoral popliteal bypass using 6x80 propaten graft    History   Social History  . Marital Status: Divorced    Spouse Name: N/A    Number of Children: N/A  . Years of Education: N/A   Occupational History  . Not on file.   Social History Main Topics  . Smoking status: Current Everyday Smoker -- 1.0 packs/day for 20 years    Types: Cigarettes  . Smokeless tobacco: Never Used   Comment: 1 pack a day  . Alcohol Use: No  . Drug Use: No  . Sexually Active:  No   Other Topics Concern  . Not on file   Social History Narrative  . No narrative on file    Family History  Problem Relation Age of Onset  . Transient ischemic attack Mother   . Hepatitis Sister   . Diabetes type II Other   . Anesthesia problems Neg Hx   . Hypotension Neg Hx   . Malignant hyperthermia Neg Hx   . Pseudochol deficiency Neg Hx     Allergies  Allergen Reactions  . Compazine (Prochlorperazine) Swelling    Per echart records  . Contrast Media (Iodinated Diagnostic Agents)     Per echart records  . Meperidine Hcl Swelling  . Metformin And Related Other (See Comments)    'just not myself, was told not to take metformin'  . Morphine Swelling  . Nubain (Nalbuphine Hcl) Swelling  . Topiramate (Topamax) Swelling    Tongue swelling - per echart records  . Toradol (Ketorolac Tromethamine) Swelling    Prior to Admission medications   Medication Sig Start Date End Date Taking? Authorizing Provider  albuterol (PROVENTIL HFA;VENTOLIN HFA) 108 (90 BASE) MCG/ACT inhaler Inhale 2 puffs into the lungs every 6 (six) hours as needed. Shortness of breath    Yes Historical Provider, MD  ALPRAZolam (XANAX) 1 MG tablet Take 1 tablet (1 mg total) by mouth 4 (four) times daily as needed for anxiety. For anxiety  08/23/11  Yes Nimish Normajean Glasgow, MD  amLODipine (NORVASC) 10 MG tablet Take 10 mg by mouth daily.    Yes Historical Provider, MD  ARIPiprazole (ABILIFY) 2 MG tablet Take 2 mg by mouth daily.    Yes Historical Provider, MD  butalbital-acetaminophen-caffeine (FIORICET, ESGIC) 50-325-40 MG per tablet Take 1 tablet by mouth 3 (three) times daily as needed. For pain   Yes Historical Provider, MD  cinacalcet (SENSIPAR) 30 MG tablet Take 30 mg by mouth daily with supper.    Yes Historical Provider, MD  cloNIDine (CATAPRES) 0.2 MG tablet Take 0.2 mg by mouth 2 (two) times daily.    Yes Historical Provider, MD  clopidogrel (PLAVIX) 75 MG tablet Take 75 mg by mouth daily. Last dose  12/17/2011   Yes Historical Provider, MD  Eyelid Cleansers (SYSTANE LID WIPES) PADS Place 1 application into the right eye daily.   Yes Historical Provider, MD  furosemide (LASIX) 40 MG tablet Take 20-40 mg by mouth daily as needed. For swelling   Yes Historical Provider, MD  homatropine 5 % ophthalmic solution Place 1 drop into the right eye 3 (three) times daily.   Yes Historical Provider, MD  insulin glargine (LANTUS) 100 UNIT/ML injection Inject  30 Units into the skin at bedtime.   Yes Historical Provider, MD  insulin lispro (HUMALOG) 100 UNIT/ML injection Inject 7-15 Units into the skin 4 (four) times daily -  before meals and at bedtime. TAKING ON SLIDING SCALE 200-249=7 units 250-299=10 units 300-349=12 units >350=15 units   Yes Historical Provider, MD  lisinopril (PRINIVIL,ZESTRIL) 40 MG tablet Take 40 mg by mouth daily.    Yes Historical Provider, MD  methocarbamol (ROBAXIN) 500 MG tablet Take 1,000 mg by mouth 4 (four) times daily as needed. For muscle spasms   Yes Historical Provider, MD  metoCLOPramide (REGLAN) 10 MG tablet Take 20 mg by mouth 4 (four) times daily.   Yes Historical Provider, MD  metoprolol tartrate (LOPRESSOR) 25 MG tablet Take 25 mg by mouth 2 (two) times daily.   Yes Historical Provider, MD  ofloxacin (OCUFLOX) 0.3 % ophthalmic solution Place 1 drop into the right eye 3 (three) times daily.    Yes Historical Provider, MD  omeprazole (PRILOSEC) 40 MG capsule Take 40 mg by mouth daily.  11/12/11  Yes Malissa Hippo, MD  oxyCODONE-acetaminophen (PERCOCET) 10-325 MG per tablet Take 1 tablet by mouth every 4 (four) hours. For pain 01/05/12  Yes Marlowe Shores, PA  PARoxetine (PAXIL) 40 MG tablet Take 80 mg by mouth every morning.    Yes Historical Provider, MD  Polyethyl Glycol-Propyl Glycol (SYSTANE ULTRA) 0.4-0.3 % SOLN Place 1 drop into both eyes 4 (four) times daily.   Yes Historical Provider, MD  promethazine (PHENERGAN) 25 MG tablet Take 25 mg by mouth every 6  (six) hours as needed. For nausea   Yes Historical Provider, MD  promethazine (PHENERGAN) 50 MG/ML injection Inject 50 mg into the muscle every 6 (six) hours as needed. For nausea   Yes Historical Provider, MD  pyridoxine (B-6) 100 MG tablet Take 100 mg by mouth daily.   Yes Historical Provider, MD  sevelamer (RENVELA) 800 MG tablet Take 1,600-4,000 mg by mouth 5 (five) times daily. *Take 5 tablets daily with each meal and take 2 tablets with snacks**   Yes Historical Provider, MD  sodium bicarbonate 650 MG tablet Take 650 mg by mouth 2 (two) times daily.   Yes Historical Provider, MD  sucralfate (CARAFATE) 1 G tablet Take 1 g by mouth 2 (two) times daily before a meal.    Yes Historical Provider, MD  temazepam (RESTORIL) 30 MG capsule Take 30 mg by mouth at bedtime.   Yes Historical Provider, MD  torsemide (DEMADEX) 100 MG tablet Take 100 mg by mouth daily.    Yes Historical Provider, MD  traZODone (DESYREL) 100 MG tablet Take 300 mg by mouth at bedtime.    Yes Historical Provider, MD  valsartan-hydrochlorothiazide (DIOVAN-HCT) 320-12.5 MG per tablet Take 1 tablet by mouth daily.   Yes Historical Provider, MD     REVIEW OF SYSTEMS: Negative except for history of present illness and past medical history PHYSICAL EXAMINATION:  Filed Vitals:   01/14/12 1538  BP: 126/64  Pulse: 83  Temp:   Resp:     General: The patient appears their stated age. She is lethargic but does answer questions Pulmonary: There is a good air exchange bilaterally Abdomen: Soft and non-tender with normal pitch bowel sounds. Abdominal incision is healing quite nicely there is no abdominal tenderness and no significant erythema and the incision Musculoskeletal: There are no major deformities.   Neurologic: No focal weakness or paresthesias are detected, Skin: The area over her pretibial area and  above her ankle where she had ulcers on the right leg are healing quite nicely that her superficial and good granulation  tissue. She does have significant erythema throughout her right leg. She has marked pitting edema from her knees distally bilaterally Psychiatric: Answers questions but is somewhat lethargic Cardiovascular: There is a regular rate and rhythm without significant murmur appreciated. Her abdominal and groin incisions are closed. She does have some skin necrosis in both groin incisions with some drainage. There does not appear to be any fluctuance. The right popliteal incision is healing as well. Do not palpate pedal pulses but her feet are well perfused. Diagnostic Studies: White blood cell count slightly elevated at 12,000, potassium 3.2     Medication Changes: None  Assessment:  Significant swelling and erythema in her right leg. She also has significant swelling in the left leg. She will be admitted for elevation and an dialysis for fluid overload. Vascular lab studies are pending  Plan: We will consult the renal service as well to assist in her hemodialysis and other medical issues  Jazz Biddy 5/20/20135:06 PM

## 2012-01-14 NOTE — ED Provider Notes (Signed)
History     CSN: [id number]  Arrival date & time 01/14/12  1313   First MD Initiated Contact with Patient 01/14/12 1411      Chief Complaint  Patient presents with  . Cellulitis  . Diarrhea    (Consider location/radiation/quality/duration/timing/severity/associated sxs/prior treatment) HPI Comments: Pt had vascular surgery to correct blockages in blood vessels of groin and both legs by Dr. Myra Gianotti 2 weeks ago and was released on 5/11.  She reports pain to both legs and groin area, is on percocet 10 mg at home, but this AM, pain much worse in right leg and redness to right leg diffusely.  Unsure of fevers or chills, has been taking her percocet at home.  Due to worsening pain, she reports didn't sleep much last night.  Came here due to pain.  No cough, vomiting, has had some loose stools.  She had normal dialysis using central catheter on left chest on Saturday and did well.  She reports still with 2 poorly healing ulcers on right leg that continue to ooze yellowish fluid.    Patient is a 41 y.o. female presenting with diarrhea. The history is provided by the patient and medical records.  Diarrhea The primary symptoms include diarrhea and arthralgias. Primary symptoms do not include fever, abdominal pain or dysuria.  The illness is also significant for chills. The illness does not include back pain.    Past Medical History  Diagnosis Date  . DVT (deep venous thrombosis)     DVT HISTORY  . Diabetes mellitus   . Migraine     HISTORY  . Anxiety and depression   . CAD (coronary artery disease)     Last cath 2009:  LAD stent 80% stenosis. Circumflex 60-70% stenosis AV groove, right coronary artery 50-60% stenosis. She did have cutting balloon angioplasty of the LAD lesion into a diagonal. However, there was restenosis of this and it was managed medically.  . Bipolar 1 disorder   . Hyperlipidemia   . Hyperparathyroidism   . PE (pulmonary embolism)     HISTORY  . Dialysis patient   .  Anemia   . Renal failure     dialysis [place] t,th sat  . Nephrotic syndrome   . Peripheral vascular disease   . MRSA infection   . Anxiety   . Depression   . Gastroparesis   . Complication of anesthesia   . PONV (postoperative nausea and vomiting)     x3 days post anesth. on 12/19/2011  . Myocardial infarction     x3 , last one - 10 yrs. ago  . Hypertension     sees Dr. Antoine Poche - [place], last stress test 11/2011, see result in EPIC, saw Dr. Antoine Poche as well at that time    . Shortness of breath   . Recurrent upper respiratory infection (URI)     Bronchitis 11/2011 - saw Dr. Loney Hering, treating currently /w antibiotic   . Pneumonia     [facility]- 2012  . GERD (gastroesophageal reflux disease)     Past Surgical History  Procedure Date  . Incise and drain abcess     OF THIGHS FROM INSULIN INJECTIONS  . Heart stents   . Portacath placement 2003  . Cataract extraction w/phaco 11/09/2011    Procedure: CATARACT EXTRACTION PHACO AND INTRAOCULAR LENS PLACEMENT (IOC);  Surgeon: Edmon Crape, MD;  Location: [facility] OR;  Service: Ophthalmology;  Laterality: Right;  . Pars plana vitrectomy 11/09/2011    Procedure: PARS PLANA VITRECTOMY WITH  23 GAUGE;  Surgeon: Edmon Crape, MD;  Location: [facility] OR;  Service: Ophthalmology;  Laterality: Right;  Ahmed Valve; Scleral Reinforcement Graft  . Dg av dialysis  shunt access exist*l* or 12/13/11    left arm  . Dialysis cath inserted & portacath      dialysis catheter inserted & portacath d/c'd- 12/19/2011  . Back surgery X 4    x4 times   . Cholecystectomy   . Eye surgery   . Cardiac catheterization   . Aorta - bilateral femoral artery bypass graft 12/28/2011    Procedure: AORTA BIFEMORAL BYPASS GRAFT;  Surgeon: Nada Libman, MD;  Location: [facility] OR;  Service: Vascular;  Laterality: N/A;  AortoBifemoral Bypass using hemasheild 14x7 graft  . Femoral-popliteal bypass graft 12/28/2011    Procedure: BYPASS GRAFT FEMORAL-POPLITEAL ARTERY;  Surgeon: Nada Libman, MD;   Location: [facility] OR;  Service: Vascular;  Laterality: Right;  right femoral popliteal bypass using 6x80 propaten graft    Family History  Problem Relation Age of Onset  . Transient ischemic attack Mother   . Hepatitis Sister   . Diabetes type II Other   . Anesthesia problems Neg Hx   . Hypotension Neg Hx   . Malignant hyperthermia Neg Hx   . Pseudochol deficiency Neg Hx     History  Substance Use Topics  . Smoking status: Current Everyday Smoker -- 1.0 packs/day for 20 years    Types: Cigarettes  . Smokeless tobacco: Never Used   Comment: 1 pack a day  . Alcohol Use: No    OB History    Grav Para Term Preterm Abortions TAB SAB Ect Mult Living                  Review of Systems  Constitutional: Positive for chills. Negative for fever.  HENT: Negative for congestion.   Respiratory: Negative for cough.   Cardiovascular: Negative for chest pain.  Gastrointestinal: Positive for diarrhea. Negative for abdominal pain.  Genitourinary: Negative for dysuria and flank pain.  Musculoskeletal: Positive for arthralgias. Negative for back pain.  Skin: Positive for color change and wound.  Neurological: Negative for weakness and numbness.  All other systems reviewed and are negative.    Allergies  Compazine; Contrast media; Meperidine hcl; Metformin and related; Morphine; Nubain; Topiramate; and Toradol  Home Medications   Current Outpatient Rx  Name Route Sig Dispense Refill  . ALBUTEROL SULFATE HFA 108 (90 BASE) MCG/ACT IN AERS Inhalation Inhale 2 puffs into the lungs every 6 (six) hours as needed. Shortness of breath     . ALPRAZOLAM 1 MG PO TABS Oral Take 1 tablet (1 mg total) by mouth 4 (four) times daily as needed for anxiety. For anxiety  30 tablet 0  . AMLODIPINE BESYLATE 10 MG PO TABS Oral Take 10 mg by mouth daily.     . ARIPIPRAZOLE 2 MG PO TABS Oral Take 2 mg by mouth daily.     Marland Kitchen BUTALBITAL-APAP-CAFFEINE 50-325-40 MG PO TABS Oral Take 1 tablet by mouth 3 (three) times  daily as needed. For pain    . CINACALCET HCL 30 MG PO TABS Oral Take 30 mg by mouth daily with supper.     Marland Kitchen CLONIDINE HCL 0.2 MG PO TABS Oral Take 0.2 mg by mouth 2 (two) times daily.     Marland Kitchen CLOPIDOGREL BISULFATE 75 MG PO TABS Oral Take 75 mg by mouth daily. Last dose 12/17/2011    . SYSTANE LID WIPES EX PADS Right  Eye Place 1 application into the right eye daily.    . FUROSEMIDE 40 MG PO TABS Oral Take 20-40 mg by mouth daily as needed. For swelling    . HOMATROPINE HBR 5 % OP SOLN Right Eye Place 1 drop into the right eye 3 (three) times daily.    . INSULIN GLARGINE 100 UNIT/ML [place] SOLN Subcutaneous Inject 30 Units into the skin at bedtime.    . INSULIN LISPRO (HUMAN) 100 UNIT/ML [place] SOLN Subcutaneous Inject 7-15 Units into the skin 4 (four) times daily -  before meals and at bedtime. TAKING ON SLIDING SCALE 200-249=7 units 250-299=10 units 300-349=12 units >350=15 units    . LISINOPRIL 40 MG PO TABS Oral Take 40 mg by mouth daily.     Marland Kitchen METHOCARBAMOL 500 MG PO TABS Oral Take 1,000 mg by mouth 4 (four) times daily as needed. For muscle spasms    . METOCLOPRAMIDE HCL 10 MG PO TABS Oral Take 20 mg by mouth 4 (four) times daily.    Marland Kitchen METOPROLOL TARTRATE 25 MG PO TABS Oral Take 25 mg by mouth 2 (two) times daily.    . OFLOXACIN 0.3 % OP SOLN Right Eye Place 1 drop into the right eye 3 (three) times daily.     Marland Kitchen OMEPRAZOLE 40 MG PO CPDR Oral Take 40 mg by mouth daily.     . OXYCODONE-ACETAMINOPHEN 10-325 MG PO TABS Oral Take 1 tablet by mouth every 4 (four) hours. For pain 40 tablet 0  . PAROXETINE HCL 40 MG PO TABS Oral Take 80 mg by mouth every morning.     Marland Kitchen POLYETHYL GLYCOL-PROPYL GLYCOL 0.4-0.3 % OP SOLN Both Eyes Place 1 drop into both eyes 4 (four) times daily.    Marland Kitchen PROMETHAZINE HCL 25 MG PO TABS Oral Take 25 mg by mouth every 6 (six) hours as needed. For nausea    . PROMETHAZINE HCL 50 MG/ML IJ SOLN Intramuscular Inject 50 mg into the muscle every 6 (six) hours as needed. For nausea    .  PYRIDOXINE HCL 100 MG PO TABS Oral Take 100 mg by mouth daily.    Marland Kitchen SEVELAMER CARBONATE 800 MG PO TABS Oral Take 1,600-4,000 mg by mouth 5 (five) times daily. *Take 5 tablets daily with each meal and take 2 tablets with snacks**    . SODIUM BICARBONATE 650 MG PO TABS Oral Take 650 mg by mouth 2 (two) times daily.    . SUCRALFATE 1 G PO TABS Oral Take 1 g by mouth 2 (two) times daily before a meal.     . TEMAZEPAM 30 MG PO CAPS Oral Take 30 mg by mouth at bedtime.    . TORSEMIDE 100 MG PO TABS Oral Take 100 mg by mouth daily.     . TRAZODONE HCL 100 MG PO TABS Oral Take 300 mg by mouth at bedtime.     Marland Kitchen VALSARTAN-HYDROCHLOROTHIAZIDE 320-12.5 MG PO TABS Oral Take 1 tablet by mouth daily.      BP 126/64  Pulse 83  Temp(Src) 97.9 F (36.6 C) (Oral)  Resp 16  SpO2 97%  LMP 09/03/2008  Physical Exam  Nursing note and vitals reviewed. Constitutional: She appears well-developed and well-nourished. She is sleeping and cooperative.  Non-toxic appearance. She does not have a sickly appearance. She appears ill.  HENT:  Head: Normocephalic.  Eyes: Pupils are equal, round, and reactive to light.  Cardiovascular: Normal rate.   Pulmonary/Chest: Effort normal. She has no wheezes. She has no rales.  Abdominal: Soft. Normal appearance. There is no tenderness. There is no rebound, no guarding and no CVA tenderness.  Musculoskeletal:       Right lower leg: She exhibits tenderness, swelling and edema.  Neurological: She is alert. She has normal strength. No sensory deficit.  Skin: Skin is warm. There is erythema.    ED Course  Procedures (including critical care time)   Labs Reviewed  CLOSTRIDIUM DIFFICILE BY PCR  CBC  DIFFERENTIAL  BASIC METABOLIC PANEL  CULTURE, BLOOD (ROUTINE X 2)  CULTURE, BLOOD (ROUTINE X 2)   No results found.   1. Cellulitis of right lower leg   2. End stage renal disease on dialysis     3:43 PM Dr. Arbie Cookey returned my call and reports he will see pt in the ED.      MDM  Pt's right lower leg appears to be cellulitis, likely has been progressing longer than just 1 day.  No fever, will check WBC, BMP, obtain cultures.  Will need IV abx and readmission.  Pt is diabetic, relatively immunocompromised.  Will discuss with vascular surgeon on call.  I reviewed pt's prior medical records including recent discharge summary from 01/05/12.         Gavin Pound. Oletta Lamas, MD 01/14/12 1610

## 2012-01-14 NOTE — ED Notes (Signed)
Call to give report to RN- not available. Called again to speak to charge- also not available.

## 2012-01-14 NOTE — Progress Notes (Signed)
ANTIBIOTIC CONSULT NOTE - INITIAL  Pharmacy Consult for Vancomycin Indication: cellulitis  Allergies  Allergen Reactions  . Compazine (Prochlorperazine) Swelling    Per echart records  . Contrast Media (Iodinated Diagnostic Agents)     Per echart records  . Meperidine Hcl Swelling  . Metformin And Related Other (See Comments)    'just not myself, was told not to take metformin'  . Morphine Swelling  . Nubain (Nalbuphine Hcl) Swelling  . Topiramate (Topamax) Swelling    Tongue swelling - per echart records  . Toradol (Ketorolac Tromethamine) Swelling    Patient Measurements:   01/05/12 wt=82.3 kg, ht=178 cm  Vital Signs: Temp: 97.9 F (36.6 C) (05/20 1327) Temp src: Oral (05/20 1327) BP: 127/58 mmHg (05/20 1742) Pulse Rate: 85  (05/20 1742) Intake/Output from previous day:   Intake/Output from this shift:    Labs:  Basename 01/14/12 1447  WBC 12.6*  HGB 8.1*  PLT 271  LABCREA --  CREATININE 4.48*    Microbiology: Recent Results (from the past 720 hour(s))  SURGICAL PCR SCREEN     Status: Normal   Collection Time   12/24/11  3:14 PM      Component Value Range Status Comment   MRSA, PCR NEGATIVE  NEGATIVE  Final    Staphylococcus aureus NEGATIVE  NEGATIVE  Final   CULTURE, BLOOD (ROUTINE X 2)     Status: Normal   Collection Time   12/29/11  8:15 PM      Component Value Range Status Comment   Specimen Description BLOOD RIGHT ARM   Final    Special Requests BOTTLES DRAWN AEROBIC ONLY 3 ML   Final    Culture  Setup Time [id number]   Final    Culture NO GROWTH 5 DAYS   Final    Report Status 01/05/2012 FINAL   Final   CULTURE, BLOOD (ROUTINE X 2)     Status: Normal   Collection Time   12/29/11  8:25 PM      Component Value Range Status Comment   Specimen Description BLOOD RIGHT HAND   Final    Special Requests BOTTLES DRAWN AEROBIC ONLY 3 ML   Final    Culture  Setup Time [id number]   Final    Culture NO GROWTH 5 DAYS   Final    Report Status 01/05/2012  FINAL   Final   CLOSTRIDIUM DIFFICILE BY PCR     Status: Normal   Collection Time   01/04/12  8:12 PM      Component Value Range Status Comment   C difficile by pcr NEGATIVE  NEGATIVE  Final   STOOL CULTURE     Status: Normal   Collection Time   01/04/12  8:12 PM      Component Value Range Status Comment   Specimen Description STOOL   Final    Special Requests NONE   Final    Culture     Final    Value: NO SALMONELLA, SHIGELLA, CAMPYLOBACTER, YERSINIA, OR E.COLI 0157:H7 ISOLATED   Report Status 01/08/2012 FINAL   Final     Medical History: Past Medical History  Diagnosis Date  . DVT (deep venous thrombosis)     DVT HISTORY  . Diabetes mellitus   . Migraine     HISTORY  . Anxiety and depression   . CAD (coronary artery disease)     Last cath 2009:  LAD stent 80% stenosis. Circumflex 60-70% stenosis AV groove, right coronary artery 50-60% stenosis. She  did have cutting balloon angioplasty of the LAD lesion into a diagonal. However, there was restenosis of this and it was managed medically.  . Bipolar 1 disorder   . Hyperlipidemia   . Hyperparathyroidism   . PE (pulmonary embolism)     HISTORY  . Dialysis patient   . Anemia   . Renal failure     dialysis [place] t,th sat  . Nephrotic syndrome   . Peripheral vascular disease   . MRSA infection   . Anxiety   . Depression   . Gastroparesis   . Complication of anesthesia   . PONV (postoperative nausea and vomiting)     x3 days post anesth. on 12/19/2011  . Myocardial infarction     x3 , last one - 10 yrs. ago  . Hypertension     sees Dr. Antoine Poche - [place], last stress test 11/2011, see result in EPIC, saw Dr. Antoine Poche as well at that time    . Shortness of breath   . Recurrent upper respiratory infection (URI)     Bronchitis 11/2011 - saw Dr. Loney Hering, treating currently /w antibiotic   . Pneumonia     [facility]- 2012  . GERD (gastroesophageal reflux disease)     Assessment: 41 yo F admitted with worsening bilateral lower  extremity swelling.  To begin vancomycin for cellulitis in RLE.  Patient also has ESRD with last HD two days ago.  Has received Vancomycin 1000 mg IV at 1620 in the ED.  Goal of Therapy:  Vancomycin trough level 10-15 mcg/ml  Plan:  1.  Vancomycin 750 mg IV x 1 now (to make 1750 mg IV total) 2.  Then begin vancomycin 1000 mg IV post HD 3.  F/up cultures, HD plans, vancomycin levels as indicated  Elonda Husky 01/14/2012,6:20 PM

## 2012-01-14 NOTE — ED Notes (Signed)
To ED for eval of right leg redness and swelling. Pt had vein bypass a cple weeks ago. Pt is a dialysis pt. Having diarrhea also. Right leg is red and hot to touch.

## 2012-01-14 NOTE — Procedures (Unsigned)
VASCULAR LAB EXAM  INDICATION:  Preoperative lower extremity vein mapping for possible aortobifemoral bypass graft placement  HISTORY: Diabetes:  Yes Cardiac:  Multiple stenting Hypertension:  Yes  EXAM:  Bilateral lower extremity great saphenous vein mapping.  IMPRESSION:  Patent right and left great saphenous veins with measurements shown on the following worksheet.  ___________________________________________ V. Charlena Cross, MD  EM/MEDQ  D:  12/17/2011  T:  12/17/2011  Job:  [id number]

## 2012-01-14 NOTE — ED Notes (Signed)
Called to give report. RN taking report on floor, to call back.

## 2012-01-14 NOTE — Procedures (Unsigned)
CEPHALIC VEIN MAPPING  INDICATION:  Vein mapping for possible access placement  HISTORY: History of right ABG, chronic kidney disease stage V, diabetic, current smoker, hypertension, multiple cardiac stents, history of failed left AVF  EXAM: The right cephalic vein appears competent with diameter measurements ranging from 0.28 to 0.11 cm.  The right basilic vein is patent with diameter measurements ranging from 0.44 to 0.30 cm.  See attached worksheet for all measurements.  IMPRESSION:  Patent right cephalic and basilic vein with diameter measurements as described above.  ___________________________________________ Fransisco Hertz, MD  EM/MEDQ  D:  12/24/2011  T:  12/24/2011  Job:  [id number]

## 2012-01-14 NOTE — Procedures (Unsigned)
CAROTID DUPLEX EXAM  INDICATION:  Carotid stenosis, preoperative exam.  HISTORY: Diabetes:  Yes Cardiac:  Stents Hypertension:  Yes Smoking:  Current Previous Surgery:  No carotid surgery CV History:  Currently asymptomatic Amaurosis [place] No, Paresthesias No, Hemiparesis No Other:  Hyperlipidemia                                      RIGHT             LEFT Brachial systolic pressure:         119               AVF Brachial Doppler waveforms:         Normal Vertebral direction of flow:        Antegrade         Antegrade DUPLEX VELOCITIES (cm/sec) CCA peak systolic                   117               96 ECA peak systolic                   104               145 ICA peak systolic                   62                105 ICA end diastolic                   28                42 PLAQUE MORPHOLOGY:                  Mixed             Mixed PLAQUE AMOUNT:                      Minimal           Moderate PLAQUE LOCATION:                    CCA, ICA, ECA     CCA, ICA, ECA  IMPRESSION: 1. Right internal carotid artery velocity suggests 1% to 39% stenosis. 2. Left internal carotid artery velocity suggests 40% to 59% stenosis. 3. Left external carotid artery stenosis. 4. Antegrade vertebral arteries bilaterally.  ___________________________________________ V. Charlena Cross, MD  EM/MEDQ  D:  12/17/2011  T:  12/17/2011  Job:  [id number]

## 2012-01-14 NOTE — ED Notes (Signed)
Pt. Still obtunded and when asked about pain she states she is still having pain.

## 2012-01-14 NOTE — ED Notes (Signed)
Sister asks to called with any changes in pt status.  [phone]. Went home and plans to come back

## 2012-01-14 NOTE — ED Notes (Signed)
Pt states pain as 10/10, but continues to fall asleep during assessment.  Questions have to be repeated to gain answers.

## 2012-01-15 ENCOUNTER — Inpatient Hospital Stay (HOSPITAL_COMMUNITY): Payer: Medicare Other

## 2012-01-15 LAB — CBC
HCT: 23.4 % — ABNORMAL LOW (ref 36.0–46.0)
Hemoglobin: 7.6 g/dL — ABNORMAL LOW (ref 12.0–15.0)
MCH: 32.3 pg (ref 26.0–34.0)
RBC: 2.35 MIL/uL — ABNORMAL LOW (ref 3.87–5.11)

## 2012-01-15 LAB — GLUCOSE, CAPILLARY
Glucose-Capillary: 74 mg/dL (ref 70–99)
Glucose-Capillary: 68 mg/dL — ABNORMAL LOW (ref 70–99)
Glucose-Capillary: 69 mg/dL — ABNORMAL LOW (ref 70–99)
Glucose-Capillary: 85 mg/dL (ref 70–99)
Glucose-Capillary: 163 mg/dL — ABNORMAL HIGH (ref 70–99)

## 2012-01-15 LAB — RENAL FUNCTION PANEL
CO2: 21 mEq/L (ref 19–32)
Calcium: 9.1 mg/dL (ref 8.4–10.5)
GFR calc Af Amer: 10 mL/min — ABNORMAL LOW (ref >90)
Glucose, Bld: 75 mg/dL (ref 70–99)
Potassium: 3.1 mEq/L — ABNORMAL LOW (ref 3.5–5.1)
Sodium: 130 mEq/L — ABNORMAL LOW (ref 135–145)

## 2012-01-15 MED ORDER — ALBUMIN HUMAN 25 % IV SOLN
INTRAVENOUS | Status: AC
  Administered 2012-01-15: 12.5 g
  Filled 2012-01-15: qty 100

## 2012-01-15 MED ORDER — ALTEPLASE 100 MG IV SOLR
4.0000 mg | INTRAVENOUS | Status: AC
  Administered 2012-01-15: 4 mg
  Filled 2012-01-15: qty 4

## 2012-01-15 MED ORDER — SODIUM CHLORIDE 0.9 % IV SOLN
62.5000 mg | INTRAVENOUS | Status: DC
  Administered 2012-01-17: 62.5 mg via INTRAVENOUS
  Filled 2012-01-15 (×2): qty 5

## 2012-01-15 MED ORDER — COLLAGENASE 250 UNIT/GM EX OINT
TOPICAL_OINTMENT | Freq: Every day | CUTANEOUS | Status: DC
  Administered 2012-01-15 – 2012-01-19 (×5): via TOPICAL
  Administered 2012-01-20: 1 via TOPICAL
  Filled 2012-01-15: qty 30

## 2012-01-15 MED ORDER — LISINOPRIL 20 MG PO TABS: 20.0000 mg | ORAL_TABLET | Freq: Every day | ORAL | Status: DC

## 2012-01-15 MED ORDER — CLONIDINE HCL 0.1 MG PO TABS
0.1000 mg | ORAL_TABLET | Freq: Two times a day (BID) | ORAL | Status: DC
  Administered 2012-01-15 – 2012-01-21 (×11): 0.1 mg via ORAL
  Filled 2012-01-15 (×13): qty 1

## 2012-01-15 MED ORDER — DARBEPOETIN ALFA-POLYSORBATE 150 MCG/0.3ML IJ SOLN: 150.0000 ug | INTRAMUSCULAR | Status: DC

## 2012-01-15 MED ORDER — BIOTENE DRY MOUTH MT LIQD
15.0000 mL | Freq: Two times a day (BID) | OROMUCOSAL | Status: DC
  Administered 2012-01-15 – 2012-01-21 (×9): 15 mL via OROMUCOSAL

## 2012-01-15 MED ORDER — ALPRAZOLAM 0.5 MG PO TABS
ORAL_TABLET | ORAL | Status: AC
  Administered 2012-01-15: 1 mg via ORAL
  Filled 2012-01-15: qty 2

## 2012-01-15 MED ORDER — DARBEPOETIN ALFA-POLYSORBATE 200 MCG/0.4ML IJ SOLN
200.0000 ug | INTRAMUSCULAR | Status: DC
  Administered 2012-01-17 – 2012-01-22 (×2): 200 ug via INTRAVENOUS
  Filled 2012-01-15: qty 0.4

## 2012-01-15 MED ORDER — METOPROLOL TARTRATE 25 MG PO TABS
25.0000 mg | ORAL_TABLET | Freq: Two times a day (BID) | ORAL | Status: DC
  Filled 2012-01-15: qty 1

## 2012-01-15 NOTE — Progress Notes (Signed)
Hemodialysis-See flowsheet Pt anxious during treatment. Medicated with Xanax and stated much relief. Unable to achieve UF goal d/t low BP throughout treatment. Albumin given x2 with no results. Unable to achieve BFR >250. MD aware. Post HD catheter instilled with TPA for overnight dwell.

## 2012-01-15 NOTE — Consult Note (Signed)
WOC consult Note Reason for Consult: eval non healing ulcers of the RLE, pt has had recent revascularization surgery.  Has 2 open ulcers of the RLE, pt unable to tell me dressings used in the past. She has had HHRN for assistance with dressing.  She is very lethargic and unable to give me much history surrounding these wounds.   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  Drainage (amount, consistency, odor)  Moderate yellow-green drainage from both wounds, but not noted odor.  Periwound: both intact without problems Dressing procedure/placement/frequency: will order silver hydrofiber dressing to manage exudate and treat any biofilm present on the wound beds. Bedside nursing to place this dressing once arrives to room. Discussed POC with pt and bedside nursing. Would recommend follow up for these ulcers at the wound care center for long term management. Re consult if needed, will not follow at this time. Thanks  Loucille Takach [employer], CWOCN [phone])

## 2012-01-15 NOTE — Progress Notes (Signed)
CBG: 68  Treatment: 15 GM carbohydrate snack  Symptoms: None  Follow-up CBG: Time: 12:12 CBG Result: 83  Possible Reasons for Event: Inadequate meal intake   SCHAUER, Bijon Mineer L

## 2012-01-15 NOTE — Consult Note (Signed)
[name] Renal Consultation Note  Indication for Consultation:  Management of ESRD/hemodialysis; anemia, hypertension/volume and secondary hyperparathyroidism  HPI: [name] is a 41 y.o. female admitted last night by Dr. Arbie Cookey  With Cellulitis/Bilateral legs swelling postop 12/28/11  Aorta-Bifemoral Bypass with bilateral profundoplasty /Right common femoral endarterectomy/Right femoral popliteal bypass by Dr. Myra Gianotti. She is followed by Dr. Fausto Skillern at [facility]  With last hd Saturday ,01/12/12 without problems per patient.  Now co some right leg discomfort, she tells me she takes Percocet every 4 hours at home for pain. She also reports using only her perm cath for hemodialysis as "needles hurt". Also per her outpatient unit tells me today she is noncompliant with her dialysis treatment time having signed off early on Saturday leaving at 86.8 kg with her edw 81.5 kg."         Past Medical History  Diagnosis Date  . DVT (deep venous thrombosis)     DVT HISTORY  . Diabetes mellitus   . Migraine     HISTORY  . Anxiety and depression   . CAD (coronary artery disease)     Last cath 2009:  LAD stent 80% stenosis. Circumflex 60-70% stenosis AV groove, right coronary artery 50-60% stenosis. She did have cutting balloon angioplasty of the LAD lesion into a diagonal. However, there was restenosis of this and it was managed medically.  . Bipolar 1 disorder   . Hyperlipidemia   . Hyperparathyroidism   . PE (pulmonary embolism)     HISTORY  . Dialysis patient   . Anemia   . Renal failure     dialysis [facility] t,th sat  . Nephrotic syndrome   . Peripheral vascular disease   . MRSA infection   . Anxiety   . Depression   . Gastroparesis   . Complication of anesthesia   . PONV (postoperative nausea and vomiting)     x3 days post anesth. on 12/19/2011  . Myocardial infarction     x3 , last one - 10 yrs. ago  . Hypertension     sees Dr. Antoine Poche - [place], last stress  test 11/2011, see result in EPIC, saw Dr. Antoine Poche as well at that time    . Shortness of breath   . Recurrent upper respiratory infection (URI)     Bronchitis 11/2011 - saw Dr. Loney Hering, treating currently /w antibiotic   . Pneumonia     [facility]- 2012  . GERD (gastroesophageal reflux disease)     Past Surgical History  Procedure Date  . Incise and drain abcess     OF THIGHS FROM INSULIN INJECTIONS  . Heart stents   . Portacath placement 2003  . Cataract extraction w/phaco 11/09/2011    Procedure: CATARACT EXTRACTION PHACO AND INTRAOCULAR LENS PLACEMENT (IOC);  Surgeon: Edmon Crape, MD;  Location: [facility] OR;  Service: Ophthalmology;  Laterality: Right;  . Pars plana vitrectomy 11/09/2011    Procedure: PARS PLANA VITRECTOMY WITH 23 GAUGE;  Surgeon: Edmon Crape, MD;  Location: [facility] OR;  Service: Ophthalmology;  Laterality: Right;  Ahmed Valve; Scleral Reinforcement Graft  . Dg av dialysis  shunt access exist*l* or 12/13/11    left arm  . Dialysis cath inserted & portacath      dialysis catheter inserted & portacath d/c'd- 12/19/2011  . Back surgery X 4    x4 times   . Cholecystectomy   . Eye surgery   . Cardiac catheterization   . Aorta - bilateral femoral artery  bypass graft 12/28/2011    Procedure: AORTA BIFEMORAL BYPASS GRAFT;  Surgeon: Nada Libman, MD;  Location: [facility] OR;  Service: Vascular;  Laterality: N/A;  AortoBifemoral Bypass using hemasheild 14x7 graft  . Femoral-popliteal bypass graft 12/28/2011    Procedure: BYPASS GRAFT FEMORAL-POPLITEAL ARTERY;  Surgeon: Nada Libman, MD;  Location: [facility] OR;  Service: Vascular;  Laterality: Right;  right femoral popliteal bypass using 6x80 propaten graft      Family History  Problem Relation Age of Onset  . Transient ischemic attack Mother   . Hepatitis Sister   . Diabetes type II Other   . Anesthesia problems Neg Hx   . Hypotension Neg Hx   . Malignant hyperthermia Neg Hx   . Pseudochol deficiency Neg Hx       reports that she has been  smoking Cigarettes.  She has a 20 pack-year smoking history. She has never used smokeless tobacco. She reports that she does not drink alcohol or use illicit drugs.   Allergies  Allergen Reactions  . Compazine (Prochlorperazine) Swelling    Per echart records  . Contrast Media (Iodinated Diagnostic Agents)     Per echart records  . Meperidine Hcl Swelling  . Metformin And Related Other (See Comments)    'just not myself, was told not to take metformin'  . Morphine Swelling  . Nubain (Nalbuphine Hcl) Swelling  . Topiramate (Topamax) Swelling    Tongue swelling - per echart records  . Toradol (Ketorolac Tromethamine) Swelling    Prior to Admission medications   Medication Sig Start Date End Date Taking? Authorizing Provider  albuterol (PROVENTIL HFA;VENTOLIN HFA) 108 (90 BASE) MCG/ACT inhaler Inhale 2 puffs into the lungs every 6 (six) hours as needed. Shortness of breath    Yes Historical Provider, MD  ALPRAZolam (XANAX) 1 MG tablet Take 1 tablet (1 mg total) by mouth 4 (four) times daily as needed for anxiety. For anxiety  08/23/11  Yes Nimish Normajean Glasgow, MD  amLODipine (NORVASC) 10 MG tablet Take 10 mg by mouth daily.    Yes Historical Provider, MD  ARIPiprazole (ABILIFY) 2 MG tablet Take 2 mg by mouth daily.    Yes Historical Provider, MD  butalbital-acetaminophen-caffeine (FIORICET, ESGIC) 50-325-40 MG per tablet Take 1 tablet by mouth 3 (three) times daily as needed. For pain   Yes Historical Provider, MD  cinacalcet (SENSIPAR) 30 MG tablet Take 30 mg by mouth daily with supper.    Yes Historical Provider, MD  cloNIDine (CATAPRES) 0.2 MG tablet Take 0.2 mg by mouth 2 (two) times daily.    Yes Historical Provider, MD  clopidogrel (PLAVIX) 75 MG tablet Take 75 mg by mouth daily. Last dose 12/17/2011   Yes Historical Provider, MD  Eyelid Cleansers (SYSTANE LID WIPES) PADS Place 1 application into the right eye daily.   Yes Historical Provider, MD  furosemide (LASIX) 40 MG tablet Take  20-40 mg by mouth daily as needed. For swelling   Yes Historical Provider, MD  homatropine 5 % ophthalmic solution Place 1 drop into the right eye 3 (three) times daily.   Yes Historical Provider, MD  insulin glargine (LANTUS) 100 UNIT/ML injection Inject 30 Units into the skin at bedtime.   Yes Historical Provider, MD  insulin lispro (HUMALOG) 100 UNIT/ML injection Inject 7-15 Units into the skin 4 (four) times daily -  before meals and at bedtime. TAKING ON SLIDING SCALE 200-249=7 units 250-299=10 units 300-349=12 units >350=15 units   Yes Historical Provider, MD  lisinopril (  PRINIVIL,ZESTRIL) 40 MG tablet Take 40 mg by mouth daily.    Yes Historical Provider, MD  methocarbamol (ROBAXIN) 500 MG tablet Take 1,000 mg by mouth 4 (four) times daily as needed. For muscle spasms   Yes Historical Provider, MD  metoCLOPramide (REGLAN) 10 MG tablet Take 20 mg by mouth 4 (four) times daily.   Yes Historical Provider, MD  metoprolol tartrate (LOPRESSOR) 25 MG tablet Take 25 mg by mouth 2 (two) times daily.   Yes Historical Provider, MD  ofloxacin (OCUFLOX) 0.3 % ophthalmic solution Place 1 drop into the right eye 3 (three) times daily.    Yes Historical Provider, MD  omeprazole (PRILOSEC) 40 MG capsule Take 40 mg by mouth daily.  11/12/11  Yes Malissa Hippo, MD  oxyCODONE-acetaminophen (PERCOCET) 10-325 MG per tablet Take 1 tablet by mouth every 4 (four) hours. For pain 01/05/12  Yes Marlowe Shores, PA  PARoxetine (PAXIL) 40 MG tablet Take 80 mg by mouth every morning.    Yes Historical Provider, MD  Polyethyl Glycol-Propyl Glycol (SYSTANE ULTRA) 0.4-0.3 % SOLN Place 1 drop into both eyes 4 (four) times daily.   Yes Historical Provider, MD  promethazine (PHENERGAN) 25 MG tablet Take 25 mg by mouth every 6 (six) hours as needed. For nausea   Yes Historical Provider, MD  promethazine (PHENERGAN) 50 MG/ML injection Inject 50 mg into the muscle every 6 (six) hours as needed. For nausea   Yes Historical  Provider, MD  pyridoxine (B-6) 100 MG tablet Take 100 mg by mouth daily.   Yes Historical Provider, MD  sevelamer (RENVELA) 800 MG tablet Take 1,600-4,000 mg by mouth 5 (five) times daily. *Take 5 tablets daily with each meal and take 2 tablets with snacks**   Yes Historical Provider, MD  sodium bicarbonate 650 MG tablet Take 650 mg by mouth 2 (two) times daily.   Yes Historical Provider, MD  sucralfate (CARAFATE) 1 G tablet Take 1 g by mouth 2 (two) times daily before a meal.    Yes Historical Provider, MD  temazepam (RESTORIL) 30 MG capsule Take 30 mg by mouth at bedtime.   Yes Historical Provider, MD  torsemide (DEMADEX) 100 MG tablet Take 100 mg by mouth daily.    Yes Historical Provider, MD  traZODone (DESYREL) 100 MG tablet Take 300 mg by mouth at bedtime.    Yes Historical Provider, MD  valsartan-hydrochlorothiazide (DIOVAN-HCT) 320-12.5 MG per tablet Take 1 tablet by mouth daily.   Yes Historical Provider, MD     Anti-infectives     Start     Dose/Rate Route Frequency Ordered Stop   01/15/12 1200   vancomycin (VANCOCIN) IVPB 1000 mg/200 mL premix        1,000 mg 200 mL/hr over 60 Minutes Intravenous Every T-Th-Sa (Hemodialysis) 01/14/12 2018     01/14/12 1845   vancomycin (VANCOCIN) 750 mg in sodium chloride 0.9 % 150 mL IVPB        750 mg 150 mL/hr over 60 Minutes Intravenous To Emergency Dept 01/14/12 1831 01/14/12 2010   01/14/12 1530   vancomycin (VANCOCIN) IVPB 1000 mg/200 mL premix        1,000 mg 200 mL/hr over 60 Minutes Intravenous  Once 01/14/12 1522 01/14/12 1720          Results for orders placed during the hospital encounter of 01/14/12 (from the past 48 hour(s))  CBC     Status: Abnormal   Collection Time   01/14/12  2:47 PM  Component Value Range Comment   WBC 12.6 (*) 4.0 - 10.5 (K/uL)    RBC 2.56 (*) 3.87 - 5.11 (MIL/uL)    Hemoglobin 8.1 (*) 12.0 - 15.0 (g/dL)    HCT 16.1 (*) 09.6 - 46.0 (%)    MCV 99.2  78.0 - 100.0 (fL)    MCH 31.6  26.0 - 34.0  (pg)    MCHC 31.9  30.0 - 36.0 (g/dL)    RDW 04.5 (*) 40.9 - 15.5 (%)    Platelets 271  150 - 400 (K/uL)   DIFFERENTIAL     Status: Abnormal   Collection Time   01/14/12  2:47 PM      Component Value Range Comment   Neutrophils Relative 84 (*) 43 - 77 (%)    Neutro Abs 10.6 (*) 1.7 - 7.7 (K/uL)    Lymphocytes Relative 10 (*) 12 - 46 (%)    Lymphs Abs 1.2  0.7 - 4.0 (K/uL)    Monocytes Relative 4  3 - 12 (%)    Monocytes Absolute 0.5  0.1 - 1.0 (K/uL)    Eosinophils Relative 2  0 - 5 (%)    Eosinophils Absolute 0.2  0.0 - 0.7 (K/uL)    Basophils Relative 0  0 - 1 (%)    Basophils Absolute 0.1  0.0 - 0.1 (K/uL)   BASIC METABOLIC PANEL     Status: Abnormal   Collection Time   01/14/12  2:47 PM      Component Value Range Comment   Sodium 128 (*) 135 - 145 (mEq/L)    Potassium 3.2 (*) 3.5 - 5.1 (mEq/L)    Chloride 88 (*) 96 - 112 (mEq/L)    CO2 20  19 - 32 (mEq/L)    Glucose, Bld 286 (*) 70 - 99 (mg/dL)    BUN 39 (*) 6 - 23 (mg/dL)    Creatinine, Ser 8.11 (*) 0.50 - 1.10 (mg/dL)    Calcium 9.7  8.4 - 10.5 (mg/dL)    GFR calc non Af Amer 11 (*) >90 (mL/min)    GFR calc Af Amer 13 (*) >90 (mL/min)   CBC     Status: Abnormal   Collection Time   01/14/12  8:49 PM      Component Value Range Comment   WBC 11.9 (*) 4.0 - 10.5 (K/uL)    RBC 2.62 (*) 3.87 - 5.11 (MIL/uL)    Hemoglobin 8.4 (*) 12.0 - 15.0 (g/dL)    HCT 91.4 (*) 78.2 - 46.0 (%)    MCV 99.2  78.0 - 100.0 (fL)    MCH 32.1  26.0 - 34.0 (pg)    MCHC 32.3  30.0 - 36.0 (g/dL)    RDW 95.6 (*) 21.3 - 15.5 (%)    Platelets 250  150 - 400 (K/uL)   CREATININE, SERUM     Status: Abnormal   Collection Time   01/14/12  8:49 PM      Component Value Range Comment   Creatinine, Ser 4.60 (*) 0.50 - 1.10 (mg/dL)    GFR calc non Af Amer 11 (*) >90 (mL/min)    GFR calc Af Amer 13 (*) >90 (mL/min)   GLUCOSE, CAPILLARY     Status: Abnormal   Collection Time   01/14/12 10:56 PM      Component Value Range Comment   Glucose-Capillary 174  (*) 70 - 99 (mg/dL)   GLUCOSE, CAPILLARY     Status: Normal   Collection Time   01/15/12  7:53 AM      Component Value Range Comment   Glucose-Capillary 74  70 - 99 (mg/dL)    EKG: normal EKG, normal sinus rhythm, unchanged from previous tracings, LBBB.  ROS: Denying any fevers, chills, sweats sob  Or chest pain. Only positives as on hpi.  Physical Exam: Filed Vitals:   01/15/12 1011  BP: 109/63  Pulse: 78  Temp: 97.9 F (36.6 C)  Resp: 16     General: Somewhat Lethargic and slow to answering my questions,NAD, obese WF appearing chronically ill HEENT: [place], nonicteric,MMM Neck: supple, no jvd Heart: RRR, distant, no rub , soft 1/6 sem lsb Lungs: decreased bs at bases, otherwise clear with poor respiratory effort for exam Abdomen: obese, soft, surgical incisions healing Extremities: bilat.+3  Bipedal edema Skin: right lower leg dressings not unwrapped with erythematous skin  Right lower leg , warm feet bilaterally Neuro: no acute focal deficits, moving bilat. Legs, , depressed affect Dialysis Access: left ij perm cath , left upper arm avgg no bruit or thrill  Dialysis Orders: Center: [facility]  on tthsat .                                     Have called [facility] for her information and have been told that she is noncompliant with staying her full treatment time. EDW =81.5 kg HD Bath/  Time / Heparin /. Access / BFR / DFR /    Zemplar 0 mcg IV/HD Epogen 22,000   Units IV/HD  Venofer  50mg  weekly Other /  Assessment/Plan 1.Cellulitis right foot = per [facility]  Plan of  Care, with iv antibiotics, wound care rn, needing hd to decrease edema of legs  1. ESRD/ volume overload -  [facility]SAt normal schedule [facility] op center/ attempt 4 to 5 liters uf today obtain records from out pt. Center.use 4 k bath with 3.2 k, may need extra tx time for tomorrow/ will dw Dr. Arlean Hopping. 2. Hypertension/volume  - her wt today 88.1kg with edw 81.5kg and BP 109/63, will taper down Catapres.2 bid to .1 bid ,  and hold the rest-  Metoprolol 25mg  bid, Lisinopril 40mg  hs, dc Demedex, dc norvasc 10mg , dc Avapro 300mg  - will hold bp meds for sbp less than110 mg. Hold all BP meds except clonidine and let BP come up for volume removal. 3. Anemia  - hgb 8.4, Aranesp , check op center for iron 4. Metabolic bone disease -  Phos binders as needed, check op center for Zemplar dosing 5. Nutrition -supplement as needed, high protein renal / diabetic restrictions. 6.    Severe ASCVD with CAD= meds and [facility]VS 7.   HO BIPOLAR DISORDER= meds 8.    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 41 yo with DM, HTN, PVD and ESRD on HD with [facility] in [place], [place], admitted with bilateral lower extremity cellulitis, R>L.  She is also volume overloaded with edema, but BP is low on 4-5 BP meds.  Will hold most of her meds and do dialysis today with large UF as tolerated.  Will follow, thanks for the referral.   Vinson Moselle  MD [name] [phone] pgr    [phone] cell 01/15/2012, 4:11 PM

## 2012-01-15 NOTE — Progress Notes (Addendum)
[facility] [place]  Progress Note Bypass Surgery  Date of Surgery: 12/28/11 Aorta-Bifemoral Bypass with bilateral profundoplasty (14X7 Dacron graft)  Right common femoral endarterectomy  Right femoral popliteal (above knee) bypass with 6mm external ringed Propatent PTFE Surgeon:  Myra Gianotti, MD  History of Present Illness  [name] is a 41 y.o. female who is S/P aortobifemoral bypass. She was re-admitted last PM with bilat leg swelling and cellulitis of the right foot. Pt is sleeping but C/O pain when awakened  Significant Diagnostic Studies: CBC Lab Results  Component Value Date   WBC 11.9* 01/14/2012   HGB 8.4* 01/14/2012   HCT 26.0* 01/14/2012   MCV 99.2 01/14/2012   PLT 250 01/14/2012    BMET     Component Value Date/Time   NA 128* 01/14/2012 1447   K 3.2* 01/14/2012 1447   CL 88* 01/14/2012 1447   CO2 20 01/14/2012 1447   GLUCOSE 286* 01/14/2012 1447   BUN 39* 01/14/2012 1447   CREATININE 4.60* 01/14/2012 2049   CREATININE 1.78* 10/17/2008 2000   CALCIUM 9.7 01/14/2012 1447   CALCIUM 8.3* 08/21/2011 0533   GFRNONAA 11* 01/14/2012 2049   GFRAA 13* 01/14/2012 2049    COAG Lab Results  Component Value Date   INR 1.14 12/28/2011   INR 1.07 12/24/2011   INR 0.96 12/20/2011   No results found for this basename: PTT    Physical Examination  BP Readings from Last 3 Encounters:  01/15/12 100/55  01/05/12 133/71  01/05/12 133/71   Temp Readings from Last 3 Encounters:  01/15/12 98.3 F (36.8 C) Oral  01/05/12 98.1 F (36.7 C) Oral  01/05/12 98.1 F (36.7 C) Oral   SpO2 Readings from Last 3 Encounters:  01/15/12 95%  01/05/12 96%  01/05/12 96%   Pulse Readings from Last 3 Encounters:  01/15/12 75  01/05/12 91  01/05/12 91    Pt is A&O x 3 Pt is afebrile. Right foot with edema and cellulitis. Left foot edematous Both groin wounds with exudative tissue. Both limbs are warm.  right shin wound at knee healed. Left ankle wound still  open with exudative tissue.  Assessment/Plan: Cellulitis right foot with bilateral edema Pt is afebrile with mildly elevated white count Will order wound care Have wound care nurse evaluate distal ankle wound Continue antibiotics  Marlowe Shores [phone] 01/15/2012 9:19 AM    Agree with above  Will need to watch both groins closely Continue antibiotics Keep legs elevated  Wells Dmitriy Gair

## 2012-01-16 LAB — GLUCOSE, CAPILLARY: Glucose-Capillary: 73 mg/dL (ref 70–99)

## 2012-01-16 MED ORDER — HEPARIN SODIUM (PORCINE) 1000 UNIT/ML IJ SOLN
1000.0000 [IU] | Freq: Once | INTRAMUSCULAR | Status: AC
  Administered 2012-01-16: 4100 [IU] via INTRAVENOUS
  Filled 2012-01-16: qty 1

## 2012-01-16 NOTE — Progress Notes (Signed)
tPA removed from both ports, pulled off 5cc of blood from each. Flushed with 10cc NS in each port. Flushed with Heparin 2.31ml (1000u/ml) in blue port and Heparin 2.11ml (1000u/ml)  In red port per priming volume. Dead end cap placed on the both ports, both clamps taped, red "high dose heparin" sticker placed on the dressing. [name]

## 2012-01-16 NOTE — Procedures (Signed)
I was present at this dialysis session. I have reviewed the session itself and made appropriate changes.   Vinson Moselle, MD [employer] 01/16/2012, 7:31 AM

## 2012-01-16 NOTE — Progress Notes (Signed)
Subjective:  Feeling better, but some right leg pain, tolerated hd yesterday  After Xanax, problems with low bfr with perm cath, overnight  TPA dwell Objective Vital signs in last 24 hours: Filed Vitals:   01/15/12 1800 01/15/12 2106 01/16/12 0534 01/16/12 1023  BP: 118/57 149/75 133/44 133/60  Pulse: 83 94 88 94  Temp: 97.5 F (36.4 C) 99.2 F (37.3 C) 97.8 F (36.6 C) 97.8 F (36.6 C)  TempSrc: Oral Oral Oral Oral  Resp: 18 17 18 16   Height:      Weight:  80.7 kg (177 lb 14.6 oz)    SpO2: 95% 88% 94% 92%   Weight change: 0.3 kg (10.6 oz)  Intake/Output Summary (Last 24 hours) at 01/16/12 1038 Last data filed at 01/16/12 0900  Gross per 24 hour  Intake    240 ml  Output   3579 ml  Net  -3339 ml   Labs: Basic Metabolic Panel:  Lab 01/15/12 1610 01/14/12 2049 01/14/12 1447  NA 130* -- 128*  K 3.1* -- 3.2*  CL 91* -- 88*  CO2 21 -- 20  GLUCOSE 75 -- 286*  BUN 45* -- 39*  CREATININE 5.34* 4.60* 4.48*  CALCIUM 9.1 -- 9.7  ALB -- -- --  PHOS 6.2* -- --   Liver Function Tests:  Lab 01/15/12 1359  AST --  ALT --  ALKPHOS --  BILITOT --  PROT --  ALBUMIN 1.7*   No results found for this basename: LIPASE:3,AMYLASE:3 in the last 168 hours No results found for this basename: AMMONIA:3 in the last 168 hours CBC:  Lab 01/15/12 1359 01/14/12 2049 01/14/12 1447  WBC 12.6* 11.9* 12.6*  NEUTROABS -- -- 10.6*  HGB 7.6* 8.4* 8.1*  HCT 23.4* 26.0* 25.4*  MCV 99.6 99.2 99.2  PLT 260 250 271   Cardiac Enzymes: No results found for this basename: CKTOTAL:5,CKMB:5,CKMBINDEX:5,TROPONINI:5 in the last 168 hours CBG:  Lab 01/15/12 2109 01/15/12 1832 01/15/12 1427 01/15/12 1212 01/15/12 1149  GLUCAP 163* 93 85 83 69*    Iron Studies: No results found for this basename: IRON,TIBC,TRANSFERRIN,FERRITIN in the last 72 hours Studies/Results: No results found. Medications:      . albumin human      . alteplase  4 mg Intracatheter STAT  . antiseptic oral rinse  15 mL  Mouth Rinse BID  . ARIPiprazole  2 mg Oral Daily  . cinacalcet  30 mg Oral Q supper  . cloNIDine  0.1 mg Oral BID  . clopidogrel  75 mg Oral Daily  . collagenase   Topical Daily  . darbepoetin (ARANESP) injection - DIALYSIS  200 mcg Intravenous Q Tue-HD  . enoxaparin  30 mg Subcutaneous Q24H  . ferric gluconate (FERRLECIT/NULECIT) IV  62.5 mg Intravenous Weekly  . homatropine  1 drop Right Eye TID  . insulin aspart  7-15 Units Subcutaneous TID AC & HS  . insulin glargine  30 Units Subcutaneous QHS  . metoCLOPramide  20 mg Oral QID  . ofloxacin  1 drop Right Eye TID  . oxyCODONE  5 mg Oral Q4H  . oxyCODONE-acetaminophen  1 tablet Oral Q4H  . pantoprazole  40 mg Oral Q1200  . PARoxetine  80 mg Oral Daily  . polyvinyl alcohol  1 drop Both Eyes QID  . pyridoxine  100 mg Oral Daily  . sevelamer  4,000 mg Oral TID WC  . sodium bicarbonate  650 mg Oral BID  . sodium chloride  3 mL Intravenous Q12H  .  sucralfate  1 g Oral BID AC  . temazepam  30 mg Oral QHS  . traZODone  300 mg Oral QHS  . vancomycin  1,000 mg Intravenous Q T,Th,Sa-HD  . DISCONTD: amLODipine  10 mg Oral Daily  . DISCONTD: cloNIDine  0.2 mg Oral BID  . DISCONTD: darbepoetin (ARANESP) injection - DIALYSIS  150 mcg Intravenous Q Tue-HD  . DISCONTD: hydrochlorothiazide  12.5 mg Oral Daily  . DISCONTD: irbesartan  300 mg Oral Daily  . DISCONTD: lisinopril  20 mg Oral Daily  . DISCONTD: lisinopril  40 mg Oral Daily  . DISCONTD: metoprolol tartrate  25 mg Oral BID  . DISCONTD: metoprolol tartrate  25 mg Oral BID  . DISCONTD: torsemide  100 mg Oral Daily   I  have reviewed scheduled and prn medications.  Physical Exam: General: alert, slightly more talkative, nad Heart: RRR, distant, no rub , soft 1/6 sem lsb  Lungs: decreased bs at bases, otherwise clear with poor respiratory effort for exam  Abdomen: obese, soft, surgical incisions healing  Extremities: Dialysis Access: less pedal edema, 1 to 2+, left ij perm  cath  Problem/Plan 1. Cellulitis right foot = per VVS Plan of Care, with iv antibiotics, wound care rn, needing hd to decrease edema of legs 2. ESRD / TTS [facility]- volume overloaded. trouble with low bfr yesterday tpa to perm cath ovrnight/ attempt 4 to 5 liters uf tomorrow with hd obtain records from out pt. 3. Hypertension/volume - her wt today 80.7kg with edw 81.5kg and BP 133/60. Holding all BP meds except clonidine and let BP come up for volume removal. Multiple meds listed  From outpt. ?? If she takes at home 4. Anemia - hgb 7.6, Aranesp238mcg , and weekly iron. 5. Metabolic bone disease - Phos 6.2 and Ca = 9.1  On Sensipar and binder   6. Nutrition -supplement as needed, high protein renal / diabetic restrictions. 7. Severe ASCVD with CAD= meds and [facility]VS  8. HO BIPOLAR DISORDER= meds  9. 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   Vinson Moselle  MD [employer] [phone] pgr    [phone] cell 01/16/2012, 12:22 PM

## 2012-01-16 NOTE — Progress Notes (Addendum)
[facility] [place]  Progress Note Bypass Surgery  Date of Surgery: 12/28/11  Aorta-Bifemoral Bypass with bilateral profundoplasty (14X7 Dacron graft)  Right common femoral endarterectomy  Right femoral popliteal (above knee) bypass with 6mm external ringed Propatent PTFE  Surgeon:  Myra Gianotti, MD       History of Present Illness  [name] is a 41 y.o. female who is S/P aortobifemoral bypass. She was re-admitted last PM with bilat leg swelling and cellulitis of the right foot. Pt is sleeping but C/O pain when awakened   Imaging: No results found.  Significant Diagnostic Studies: CBC Lab Results  Component Value Date   WBC 12.6* 01/15/2012   HGB 7.6* 01/15/2012   HCT 23.4* 01/15/2012   MCV 99.6 01/15/2012   PLT 260 01/15/2012    BMET     Component Value Date/Time   NA 130* 01/15/2012 1359   K 3.1* 01/15/2012 1359   CL 91* 01/15/2012 1359   CO2 21 01/15/2012 1359   GLUCOSE 75 01/15/2012 1359   BUN 45* 01/15/2012 1359   CREATININE 5.34* 01/15/2012 1359   CREATININE 1.78* 10/17/2008 2000   CALCIUM 9.1 01/15/2012 1359   CALCIUM 8.3* 08/21/2011 0533   GFRNONAA 9* 01/15/2012 1359   GFRAA 10* 01/15/2012 1359    COAG Lab Results  Component Value Date   INR 1.14 12/28/2011   INR 1.07 12/24/2011   INR 0.96 12/20/2011   No results found for this basename: PTT    Physical Examination  BP Readings from Last 3 Encounters:  01/16/12 133/60  01/05/12 133/71  01/05/12 133/71   Temp Readings from Last 3 Encounters:  01/16/12 97.8 F (36.6 C) Oral  01/05/12 98.1 F (36.7 C) Oral  01/05/12 98.1 F (36.7 C) Oral   SpO2 Readings from Last 3 Encounters:  01/16/12 92%  01/05/12 96%  01/05/12 96%   Pulse Readings from Last 3 Encounters:  01/16/12 94  01/05/12 91  01/05/12 91    Pt is A&O x 3 Right foot positive edema with cellulitis Left foot with edema Both limbs are warm to touch Bilateral groin wounds and left ankle wounds are  exudative    Assessment/Plan: Cellulitis right foot with bilateral edema in the feet Continue wound care and antibiotics Continue wound care as ordered WBC 12.6 no change from 01-15-12 She continues to be afebrile.    [facility] [phone] 01/16/2012 3:08 PM  Agree with above Continue Abx and wound care   Wells Arasely Akkerman

## 2012-01-17 ENCOUNTER — Inpatient Hospital Stay (HOSPITAL_COMMUNITY): Payer: Medicare Other

## 2012-01-17 LAB — GLUCOSE, CAPILLARY: Glucose-Capillary: 104 mg/dL — ABNORMAL HIGH (ref 70–99)

## 2012-01-17 LAB — CBC
MCH: 31.8 pg (ref 26.0–34.0)
MCV: 99.6 fL (ref 78.0–100.0)
Platelets: 291 10*3/uL (ref 150–400)
RDW: 19.7 % — ABNORMAL HIGH (ref 11.5–15.5)

## 2012-01-17 LAB — BASIC METABOLIC PANEL
Calcium: 9.1 mg/dL (ref 8.4–10.5)
Creatinine, Ser: 4.38 mg/dL — ABNORMAL HIGH (ref 0.50–1.10)
GFR calc Af Amer: 13 mL/min — ABNORMAL LOW (ref >90)

## 2012-01-17 MED ORDER — DARBEPOETIN ALFA-POLYSORBATE 200 MCG/0.4ML IJ SOLN
INTRAMUSCULAR | Status: AC
  Administered 2012-01-17: 200 ug via INTRAVENOUS
  Filled 2012-01-17: qty 0.4

## 2012-01-17 NOTE — Procedures (Signed)
I was present at this dialysis session. I have reviewed the session itself and made appropriate changes.   Vinson Moselle, MD [employer] 01/17/2012, 3:02 PM

## 2012-01-17 NOTE — Progress Notes (Addendum)
[facility] OF   Progress Note Bypass Surgery  History of Present Illness  [name] is a 41 y.o. female who is S/P Aorto-bifem who was admitted with open groin wounds.  Swelling in both feet much better.   Significant Diagnostic Studies: CBC Lab Results  Component Value Date   WBC 10.5 01/17/2012   HGB 7.4* 01/17/2012   HCT 23.2* 01/17/2012   MCV 99.6 01/17/2012   PLT 291 01/17/2012    BMET     Component Value Date/Time   NA 137 01/17/2012 0733   K 3.3* 01/17/2012 0733   CL 98 01/17/2012 0733   CO2 25 01/17/2012 0733   GLUCOSE 76 01/17/2012 0733   BUN 31* 01/17/2012 0733   CREATININE 4.38* 01/17/2012 0733   CREATININE 1.78* 10/17/2008 2000   CALCIUM 9.1 01/17/2012 0733   CALCIUM 8.3* 08/21/2011 0533   GFRNONAA 11* 01/17/2012 0733   GFRAA 13* 01/17/2012 0733    COAG Lab Results  Component Value Date   INR 1.14 12/28/2011   INR 1.07 12/24/2011   INR 0.96 12/20/2011   No results found for this basename: PTT    Physical Examination  BP Readings from Last 3 Encounters:  01/17/12 124/61  01/05/12 133/71  01/05/12 133/71   Temp Readings from Last 3 Encounters:  01/17/12 98 F (36.7 C) Oral  01/05/12 98.1 F (36.7 C) Oral  01/05/12 98.1 F (36.7 C) Oral   SpO2 Readings from Last 3 Encounters:  01/17/12 90%  01/05/12 96%  01/05/12 96%   Pulse Readings from Last 3 Encounters:  01/17/12 80  01/05/12 91  01/05/12 91    Pt is A&O x 3 BLE groin  Incision/s is/are draining and separated, There is a large amount of exudative tissue. Left groin open to 2nd latyer of sutures No graft exposed Limb is warm; with good color  Assessment/Plan: Pt. Is stable, afebrile and white count normal Swelling BLE much improved with HD and elevation both wounds debrided today Post-op pain is controlled PT/OT for ambulation Continue wound care as ordered  Marlowe Shores [phone] 01/17/2012 3:28 PM   Agree with above  Wounds examined today.   Groins debrided.  Continue with wound care Patient wants to go home, possibly tomorrow.  [name]

## 2012-01-17 NOTE — Progress Notes (Signed)
Subjective:  On hd , no cos, rt. Leg pain improved Objective Vital signs in last 24 hours: Filed Vitals:   01/17/12 1000 01/17/12 1030 01/17/12 1100 01/17/12 1116  BP: 106/51 111/56 104/54 109/53  Pulse: 98 105 94 94  Temp:    99.5 F (37.5 C)  TempSrc:    Oral  Resp: 16 16 18 19   Height:      Weight:    80.3 kg (177 lb 0.5 oz)  SpO2:       Weight change: -7.7 kg (-16 lb 15.6 oz)  Intake/Output Summary (Last 24 hours) at 01/17/12 1153 Last data filed at 01/17/12 1116  Gross per 24 hour  Intake      0 ml  Output   4202 ml  Net  -4202 ml   Labs: Basic Metabolic Panel:  Lab 01/17/12 5621 01/15/12 1359 01/14/12 2049 01/14/12 1447  NA 137 130* -- 128*  K 3.3* 3.1* -- 3.2*  CL 98 91* -- 88*  CO2 25 21 -- 20  GLUCOSE 76 75 -- 286*  BUN 31* 45* -- 39*  CREATININE 4.38* 5.34* 4.60* --  CALCIUM 9.1 9.1 -- 9.7  ALB -- -- -- --  PHOS -- 6.2* -- --   Liver Function Tests:  Lab 01/15/12 1359  AST --  ALT --  ALKPHOS --  BILITOT --  PROT --  ALBUMIN 1.7*   No results found for this basename: LIPASE:3,AMYLASE:3 in the last 168 hours No results found for this basename: AMMONIA:3 in the last 168 hours CBC:  Lab 01/17/12 0733 01/15/12 1359 01/14/12 2049 01/14/12 1447  WBC 10.5 12.6* 11.9* --  NEUTROABS -- -- -- 10.6*  HGB 7.4* 7.6* 8.4* --  HCT 23.2* 23.4* 26.0* --  MCV 99.6 99.6 99.2 99.2  PLT 291 260 250 --   Cardiac Enzymes: No results found for this basename: CKTOTAL:5,CKMB:5,CKMBINDEX:5,TROPONINI:5 in the last 168 hours CBG:  Lab 01/17/12 1146 01/16/12 2120 01/16/12 1714 01/16/12 1259 01/15/12 2109  GLUCAP 104* 78 93 73 163*    Iron Studies: No results found for this basename: IRON,TIBC,TRANSFERRIN,FERRITIN in the last 72 hours Studies/Results: No results found. Medications:      . antiseptic oral rinse  15 mL Mouth Rinse BID  . ARIPiprazole  2 mg Oral Daily  . cinacalcet  30 mg Oral Q supper  . cloNIDine  0.1 mg Oral BID  . clopidogrel  75 mg Oral  Daily  . collagenase   Topical Daily  . darbepoetin (ARANESP) injection - DIALYSIS  200 mcg Intravenous Q Tue-HD  . enoxaparin  30 mg Subcutaneous Q24H  . ferric gluconate (FERRLECIT/NULECIT) IV  62.5 mg Intravenous Weekly  . heparin  1,000 Units Intravenous Once  . homatropine  1 drop Right Eye TID  . insulin aspart  7-15 Units Subcutaneous TID AC & HS  . insulin glargine  30 Units Subcutaneous QHS  . metoCLOPramide  20 mg Oral QID  . ofloxacin  1 drop Right Eye TID  . oxyCODONE  5 mg Oral Q4H  . oxyCODONE-acetaminophen  1 tablet Oral Q4H  . pantoprazole  40 mg Oral Q1200  . PARoxetine  80 mg Oral Daily  . polyvinyl alcohol  1 drop Both Eyes QID  . pyridoxine  100 mg Oral Daily  . sevelamer  4,000 mg Oral TID WC  . sodium bicarbonate  650 mg Oral BID  . sodium chloride  3 mL Intravenous Q12H  . sucralfate  1 g Oral BID AC  .  temazepam  30 mg Oral QHS  . traZODone  300 mg Oral QHS  . vancomycin  1,000 mg Intravenous Q T,Th,Sa-HD   I  have reviewed scheduled and prn medications.  Physical Exam: General: alert,  more talkative, nad  Heart: RRR, distant, no rub , soft 1/6 sem lsb  Lungs: decreased bs at bases, otherwise clear   Abdomen: obese, soft, surgical incisions healing Extremities: Dialysis Access: less pedal edema, trace, left ij perm cath  Problem/Plan: 1. Cellulitis right foot = per VVS Plan of Care, with iv antibiotics, wound care rn, needing hd to decrease edema of legs with wt down almost 8 kg from admit with hd 2. ESRD / TTS [facility]- volume overloaded.  tpa to perm cath  Now 350 bfr on hd. 3. Hypertension/volume - her wt today 84.7kg withvol down to 80.3 post hd / with edw 81.5kg and BP 109/53. Holding all BP meds except clonidine and let BP come up for volume removal. Multiple meds listed From outpt. ?? If she takes at home/   New lower edw at dc=80kg or lower. 4. Anemia - hgb 7.4, on admit 8,4  Aranesp238mcg , and weekly iron. Fu nxt lab asymp. 5. Metabolic  bone disease - Phos 6.2 and Ca = 9.1 On Sensipar and binder  6. Nutrition -supplement as needed, high protein renal / diabetic restrictions. 7. Severe ASCVD with CAD= meds and VVS  8. HO BIPOLAR DISORDER= meds  9. 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  Vinson Moselle  MD [facility] [phone] pgr    [phone] cell 01/17/2012, 3:02 PM    LOS: 3 days  2

## 2012-01-17 NOTE — Progress Notes (Signed)
ANTIBIOTIC CONSULT NOTE - Follow-up  Pharmacy Consult for Vancomycin Indication: cellulitis  Allergies  Allergen Reactions  . Compazine (Prochlorperazine) Swelling    Per echart records  . Contrast Media (Iodinated Diagnostic Agents)     Per echart records  . Meperidine Hcl Swelling  . Metformin And Related Other (See Comments)    'just not myself, was told not to take metformin'  . Morphine Swelling  . Nubain (Nalbuphine Hcl) Swelling  . Topiramate (Topamax) Swelling    Tongue swelling - per echart records  . Toradol (Ketorolac Tromethamine) Swelling    Patient Measurements: Height: 5\' 10"  (177.8 cm) Weight: 177 lb 0.5 oz (80.3 kg) IBW/kg (Calculated) : 68.5  01/05/12 wt=82.3 kg, ht=178 cm  Vital Signs: Temp: 98 F (36.7 C) (05/23 1135) Temp src: Oral (05/23 1135) BP: 126/68 mmHg (05/23 1135) Pulse Rate: 92  (05/23 1135) Intake/Output from previous day: 05/22 0701 - 05/23 0700 In: 240 [P.O.:240] Out: 200 [Urine:200] Intake/Output from this shift: Total I/O In: 0  Out: 4002 [Other:4002]  Labs:  [facility] 01/17/12 0733 01/15/12 1359 01/14/12 2049  WBC 10.5 12.6* 11.9*  HGB 7.4* 7.6* 8.4*  PLT 291 260 250  LABCREA -- -- --  CREATININE 4.38* 5.34* 4.60*    Microbiology: Recent Results (from the past 720 hour(s))  SURGICAL PCR SCREEN     Status: Normal   Collection Time   12/24/11  3:14 PM      Component Value Range Status Comment   MRSA, PCR NEGATIVE  NEGATIVE  Final    Staphylococcus aureus NEGATIVE  NEGATIVE  Final   CULTURE, BLOOD (ROUTINE X 2)     Status: Normal   Collection Time   12/29/11  8:15 PM      Component Value Range Status Comment   Specimen Description BLOOD RIGHT ARM   Final    Special Requests BOTTLES DRAWN AEROBIC ONLY 3 ML   Final    Culture  Setup Time [id number]   Final    Culture NO GROWTH 5 DAYS   Final    Report Status 01/05/2012 FINAL   Final   CULTURE, BLOOD (ROUTINE X 2)     Status: Normal   Collection Time   12/29/11  8:25 PM        Component Value Range Status Comment   Specimen Description BLOOD RIGHT HAND   Final    Special Requests BOTTLES DRAWN AEROBIC ONLY 3 ML   Final    Culture  Setup Time [id number]   Final    Culture NO GROWTH 5 DAYS   Final    Report Status 01/05/2012 FINAL   Final   CLOSTRIDIUM DIFFICILE BY PCR     Status: Normal   Collection Time   01/04/12  8:12 PM      Component Value Range Status Comment   C difficile by pcr NEGATIVE  NEGATIVE  Final   STOOL CULTURE     Status: Normal   Collection Time   01/04/12  8:12 PM      Component Value Range Status Comment   Specimen Description STOOL   Final    Special Requests NONE   Final    Culture     Final    Value: NO SALMONELLA, SHIGELLA, CAMPYLOBACTER, YERSINIA, OR E.COLI 0157:H7 ISOLATED   Report Status 01/08/2012 FINAL   Final   CULTURE, BLOOD (ROUTINE X 2)     Status: Normal (Preliminary result)   Collection Time   01/14/12  3:00 PM  Component Value Range Status Comment   Specimen Description BLOOD HAND RIGHT   Final    Special Requests BOTTLES DRAWN AEROBIC ONLY 3CC   Final    Culture  Setup Time [id number]   Final    Culture     Final    Value:        BLOOD CULTURE RECEIVED NO GROWTH TO DATE CULTURE WILL BE HELD FOR 5 DAYS BEFORE ISSUING A FINAL NEGATIVE REPORT   Report Status PENDING   Incomplete   CULTURE, BLOOD (ROUTINE X 2)     Status: Normal (Preliminary result)   Collection Time   01/14/12  3:10 PM      Component Value Range Status Comment   Specimen Description BLOOD FOREARM RIGHT   Final    Special Requests BOTTLES DRAWN AEROBIC ONLY 3CC   Final    Culture  Setup Time [id number]   Final    Culture     Final    Value:        BLOOD CULTURE RECEIVED NO GROWTH TO DATE CULTURE WILL BE HELD FOR 5 DAYS BEFORE ISSUING A FINAL NEGATIVE REPORT   Report Status PENDING   Incomplete     Medical History: Past Medical History  Diagnosis Date  . DVT (deep venous thrombosis)     DVT HISTORY  . Diabetes mellitus   . Migraine      HISTORY  . Anxiety and depression   . CAD (coronary artery disease)     Last cath 2009:  LAD stent 80% stenosis. Circumflex 60-70% stenosis AV groove, right coronary artery 50-60% stenosis. She did have cutting balloon angioplasty of the LAD lesion into a diagonal. However, there was restenosis of this and it was managed medically.  . Bipolar 1 disorder   . Hyperlipidemia   . Hyperparathyroidism   . PE (pulmonary embolism)     HISTORY  . Dialysis patient   . Anemia   . Renal failure     [facility] t,th sat  . Nephrotic syndrome   . Peripheral vascular disease   . MRSA infection   . Anxiety   . Depression   . Gastroparesis   . Complication of anesthesia   . PONV (postoperative nausea and vomiting)     x3 days post anesth. on 12/19/2011  . Myocardial infarction     x3 , last one - 10 yrs. ago  . Hypertension     sees Dr. Antoine Poche - [place], last stress test 11/2011, see result in EPIC, saw Dr. Antoine Poche as well at that time    . Shortness of breath   . Recurrent upper respiratory infection (URI)     Bronchitis 11/2011 - saw Dr. Loney Hering, treating currently /w antibiotic   . Pneumonia     [facility]- 2012  . GERD (gastroesophageal reflux disease)     Assessment: 41 yo F admitted with worsening bilateral lower extremity swelling.  On vancomycin with each HD for cellulitis in RLE.  Cultures with NGTD.  Tolerated 4 hrs of HD 5/21 with BFR 250; and 4 hrs of HD today with BFR of 350.  Goal of Therapy:  Vancomycin trough level 10-15 mcg/ml  Plan:  1.  Continue vancomycin 1g IV q HD for now. 2. Consider vancomycin pre-HD level on Saturday if vancomycin continues. 3. F/U cultures.  Konstantinos Cordoba, Gwenlyn Found 01/17/2012,1:24 PM

## 2012-01-18 LAB — CBC
HCT: 24.4 % — ABNORMAL LOW (ref 36.0–46.0)
MCHC: 31.1 g/dL (ref 30.0–36.0)
MCV: 100 fL (ref 78.0–100.0)
Platelets: 331 10*3/uL (ref 150–400)
RDW: 19.6 % — ABNORMAL HIGH (ref 11.5–15.5)
WBC: 10.4 10*3/uL (ref 4.0–10.5)

## 2012-01-18 LAB — GLUCOSE, CAPILLARY: Glucose-Capillary: 149 mg/dL — ABNORMAL HIGH (ref 70–99)

## 2012-01-18 MED ORDER — HEPARIN SODIUM (PORCINE) 1000 UNIT/ML DIALYSIS
20.0000 [IU]/kg | INTRAMUSCULAR | Status: DC | PRN
  Administered 2012-01-19: 1600 [IU] via INTRAVENOUS_CENTRAL
  Filled 2012-01-18: qty 2

## 2012-01-18 NOTE — Progress Notes (Signed)
[facility] [place]  Subjective  -   No acute events overnight   Physical Exam:  Groins continue to soft. There remained stable no purulent tissue in each groin. No graft is exposed. Both lower extremity wounds are healing. The one just below the knee has nearly completely resolved. The other one remains dressed and improving.       Assessment/Plan:    I discussed with the patient keeping her in the hospital for several more days to keep a close watch on both of her groin wounds. I recommend continuing antibiotics as well as daily wound care.  Mimie Goering IV, V. WELLS 01/18/2012 12:41 PM --  Filed Vitals:   01/18/12 1000  BP: 150/71  Pulse: 87  Temp: 98.6 F (37 C)  Resp: 20    Intake/Output Summary (Last 24 hours) at 01/18/12 1241 Last data filed at 01/18/12 0900  Gross per 24 hour  Intake   1020 ml  Output      0 ml  Net   1020 ml     Laboratory CBC    Component Value Date/Time   WBC 10.4 01/18/2012 0540   HGB 7.6* 01/18/2012 0540   HCT 24.4* 01/18/2012 0540   PLT 331 01/18/2012 0540    BMET    Component Value Date/Time   NA 137 01/17/2012 0733   K 3.3* 01/17/2012 0733   CL 98 01/17/2012 0733   CO2 25 01/17/2012 0733   GLUCOSE 76 01/17/2012 0733   BUN 31* 01/17/2012 0733   CREATININE 4.38* 01/17/2012 0733   CREATININE 1.78* 10/17/2008 2000   CALCIUM 9.1 01/17/2012 0733   CALCIUM 8.3* 08/21/2011 0533   GFRNONAA 11* 01/17/2012 0733   GFRAA 13* 01/17/2012 0733    COAG Lab Results  Component Value Date   INR 1.14 12/28/2011   INR 1.07 12/24/2011   INR 0.96 12/20/2011   No results found for this basename: PTT    Antibiotics Anti-infectives     Start     Dose/Rate Route Frequency Ordered Stop   01/15/12 1200   vancomycin (VANCOCIN) IVPB 1000 mg/200 mL premix        1,000 mg 200 mL/hr over 60 Minutes Intravenous Every T-Th-Sa (Hemodialysis) 01/14/12 2018     01/14/12 1845   vancomycin (VANCOCIN) 750 mg in sodium chloride 0.9 % 150 mL  IVPB        750 mg 150 mL/hr over 60 Minutes Intravenous To Emergency Dept 01/14/12 1831 01/14/12 2010   01/14/12 1530   vancomycin (VANCOCIN) IVPB 1000 mg/200 mL premix        1,000 mg 200 mL/hr over 60 Minutes Intravenous  Once 01/14/12 1522 01/14/12 1720           V. Charlena Cross, M.D. [facility] [place] Office: [phone] Pager:  [phone]

## 2012-01-18 NOTE — Progress Notes (Signed)
Subjective: No current complaints, comfortable.  Objective: Vital signs in last 24 hours: Temp:  [97.3 F (36.3 C)-99.5 F (37.5 C)] 98.7 F (37.1 C) (05/24 0539) Pulse Rate:  [80-105] 96  (05/24 0539) Resp:  [16-20] 20  (05/24 0539) BP: (104-149)/(51-83) 149/72 mmHg (05/24 0539) SpO2:  [86 %-92 %] 86 % (05/24 0539) Weight:  [80.3 kg (177 lb 0.5 oz)] 80.3 kg (177 lb 0.5 oz) (05/23 2012) Weight change: -0.4 kg (-14.1 oz)  Intake/Output from previous day: 05/23 0701 - 05/24 0700 In: 780 [P.O.:780] Out: 4002    EXAM: General appearance:  Alert, in no apparent distress  Resp:  CTA bilaterally  Cardio:  RRR with Gr I/VI systolic murmur GI:  + BS, soft and nontender Extremities:  No edema Access:  Left IJ catheter  Lab Results:  Basename 01/18/12 0540 01/17/12 0733  WBC 10.4 10.5  HGB 7.6* 7.4*  HCT 24.4* 23.2*  PLT 331 291   BMET:  Basename 01/17/12 0733 01/15/12 1359  NA 137 130*  K 3.3* 3.1*  CL 98 91*  CO2 25 21  GLUCOSE 76 75  BUN 31* 45*  CREATININE 4.38* 5.34*  CALCIUM 9.1 9.1  ALBUMIN -- 1.7*   No results found for this basename: PTH:2 in the last 72 hours Iron Studies: No results found for this basename: IRON,TIBC,TRANSFERRIN,FERRITIN in the last 72 hours  Assessment/Plan: 1. Cellulitis right leg > left leg - per VVS Plan of Care, on IV antibiotics, wound care rn; wt down almost 8 kg from admit with HD. Volume overload probably contributed to this infection happening.  2. ESRD / TTS [facility]-  volume overload improving K 3.3; s/p TPA to perm cath yesterday.  Next HD tomorrow, max UF with HD Sat. 3. Hypertension/volume - BP 149/72 on Clonidine 0.1 mg bid (other outpatient meds currently held); post-HD wt yesterday was 80.3 kg s/p net UF 4 L (EDW 81.5kg).  New lower EDW at dc 80kg or lower. 4. Anemia - Hgb 7.6, improving (on admit 8.4) on Aranesp and weekly iron.  5. Metabolic bone disease - Phos 6.2 and Ca 9.1, on Sensipar and binder.  6. Nutrition  -supplement as needed, high protein renal / diabetic restrictions. 7. S/P recent aortobifemoral bypass graft 5/3 -  wounds being looked at by VVS 8. Severe ASCVD with CAD - meds and VVS.  9. Hx of bipolar disorder - meds.  10. IDDM - diet and insulin with ssi per adm.   LOS: 4 days   LYLES,CHARLES 01/18/2012,9:15 AM  Patient seen and examined and agree with assessment and plan as above with additions underlined.   Vinson Moselle  MD [facility] [phone] pgr    [phone] cell 01/18/2012, 11:14 AM

## 2012-01-19 ENCOUNTER — Inpatient Hospital Stay (HOSPITAL_COMMUNITY): Payer: Medicare Other

## 2012-01-19 LAB — CBC
Hemoglobin: 7.5 g/dL — ABNORMAL LOW (ref 12.0–15.0)
MCH: 31.8 pg (ref 26.0–34.0)
MCHC: 31.5 g/dL (ref 30.0–36.0)
MCV: 100.8 fL — ABNORMAL HIGH (ref 78.0–100.0)
RBC: 2.36 MIL/uL — ABNORMAL LOW (ref 3.87–5.11)

## 2012-01-19 LAB — RENAL FUNCTION PANEL
BUN: 34 mg/dL — ABNORMAL HIGH (ref 6–23)
CO2: 28 mEq/L (ref 19–32)
Chloride: 91 mEq/L — ABNORMAL LOW (ref 96–112)
Creatinine, Ser: 4.43 mg/dL — ABNORMAL HIGH (ref 0.50–1.10)
Glucose, Bld: 221 mg/dL — ABNORMAL HIGH (ref 70–99)
Potassium: 2.9 mEq/L — ABNORMAL LOW (ref 3.5–5.1)

## 2012-01-19 LAB — GLUCOSE, CAPILLARY
Glucose-Capillary: 237 mg/dL — ABNORMAL HIGH (ref 70–99)
Glucose-Capillary: 181 mg/dL — ABNORMAL HIGH (ref 70–99)
Glucose-Capillary: 332 mg/dL — ABNORMAL HIGH (ref 70–99)

## 2012-01-19 NOTE — Progress Notes (Signed)
Call placed to Dr. Darrick Penna to clarify patient dose, time and frequency of patient percocet and oxycodone, no new orders received.

## 2012-01-19 NOTE — Progress Notes (Addendum)
[facility] [place]  Progress Note Bypass Surgery Subjective  [name] is a 41 y.o. female who is S/P aortobifem and right fem -pop bypass who was admitted with breakdown of both groin wounds. Pt on HD. Right foot cellulitis and swelling much improved on ABX  Significant Diagnostic Studies: CBC Lab Results  Component Value Date   WBC 8.8 01/19/2012   HGB 7.5* 01/19/2012   HCT 23.8* 01/19/2012   MCV 100.8* 01/19/2012   PLT 385 01/19/2012    BMET     Component Value Date/Time   NA 132* 01/19/2012 0844   K 2.9* 01/19/2012 0844   CL 91* 01/19/2012 0844   CO2 28 01/19/2012 0844   GLUCOSE 221* 01/19/2012 0844   BUN 34* 01/19/2012 0844   CREATININE 4.43* 01/19/2012 0844   CREATININE 1.78* 10/17/2008 2000   CALCIUM 9.3 01/19/2012 0844   CALCIUM 8.3* 08/21/2011 0533   GFRNONAA 11* 01/19/2012 0844   GFRAA 13* 01/19/2012 0844    COAG Lab Results  Component Value Date   INR 1.14 12/28/2011   INR 1.07 12/24/2011   INR 0.96 12/20/2011   No results found for this basename: PTT    Physical Examination  BP Readings from Last 3 Encounters:  01/19/12 111/67  01/05/12 133/71  01/05/12 133/71   Temp Readings from Last 3 Encounters:  01/19/12 97.1 F (36.2 C) Oral  01/05/12 98.1 F (36.7 C) Oral  01/05/12 98.1 F (36.7 C) Oral   SpO2 Readings from Last 3 Encounters:  01/19/12 92%  01/05/12 96%  01/05/12 96%   Pulse Readings from Last 3 Encounters:  01/19/12 90  01/05/12 91  01/05/12 91    Pt is A&O x 3 RLE swelling and cellulitis much improved Dressing not changed as pt on HD   Assessment/Plan: Pt. Doing well Post-op pain is controlled Wounds - will change dressings tomorrow- will need further debridement to clean these up PT/OT for ambulation Continue wound care as ordered  Marlowe Shores [phone] 01/19/2012 10:31 AM   Right and left groin wounds with fibrinous exudate Will require more debridement, graft at risk Continue current  wound care  Fabienne Bruns, MD [facility] [place] Office: [phone] Pager: [phone]

## 2012-01-19 NOTE — Procedures (Signed)
I was present at this dialysis session. I have reviewed the session itself and made appropriate changes.   Vinson Moselle, MD [employer] 01/19/2012, 1:04 PM

## 2012-01-19 NOTE — Progress Notes (Signed)
Subjective:  Seen on HD, no complaints, comfortable.  Objective: Vital signs in last 24 hours: Temp:  [97.1 F (36.2 C)-98.8 F (37.1 C)] 97.1 F (36.2 C) (05/25 0820) Pulse Rate:  [86-93] 91  (05/25 0839) Resp:  [17-20] 18  (05/25 0820) BP: (132-171)/(51-91) 163/91 mmHg (05/25 0839) SpO2:  [77 %-97 %] 92 % (05/25 0822) Weight:  [85.2 kg (187 lb 13.3 oz)] 85.2 kg (187 lb 13.3 oz) (05/25 0820) Weight change: 4.9 kg (10 lb 12.8 oz)  Intake/Output from previous day: 05/24 0701 - 05/25 0700 In: 240 [P.O.:240] Out: -    EXAM: General appearance:  Alert, in no apparent distress Resp:  CTA bilaterally Cardio:  RRR with Gr II/VI systolic murmur GI:  + BS, soft and nontender Extremities:  No edema Access:  Left IJ catheter  Lab Results:  Basename 01/18/12 0540 01/17/12 0733  WBC 10.4 10.5  HGB 7.6* 7.4*  HCT 24.4* 23.2*  PLT 331 291   BMET:  Basename 01/17/12 0733  NA 137  K 3.3*  CL 98  CO2 25  GLUCOSE 76  BUN 31*  CREATININE 4.38*  CALCIUM 9.1  ALBUMIN --   No results found for this basename: PTH:2 in the last 72 hours Iron Studies: No results found for this basename: IRON,TIBC,TRANSFERRIN,FERRITIN in the last 72 hours  Assessment/Plan: 1. Cellulitis right leg > left leg - per VVS Plan of Care, on IV antibiotics, wound care; wt down almost 8 kg from admit with HD. Volume overload probably contributed to this infection.  2. ESRD / TTS [facility] - HD on TTS at [facility] [place]; volume overload improving, K 3.3.   3. Hypertension/volume - BP 149/72 on Clonidine 0.1 mg bid (other outpatient meds currently held); pre-HD wt  85.2 kg today with UF goal of  4 L (EDW 81.5kg). New lower EDW at dc 80kg or lower. 4. Anemia - Hgb 7.6, improving (on admit 8.4) on Aranesp and weekly iron.  5. Metabolic bone disease - Phos 6.2 and Ca 9.1, on Sensipar and binder.  6. Nutrition -supplement as needed, high protein renal / diabetic restrictions. 7. S/P recent aortobifemoral  bypass graft 5/3 - wounds improving, followed by VVS. 8. Severe ASCVD with CAD - meds and VVS.  9. Hx of bipolar disorder - meds.  10. IDDM - diet and insulin with ssi per adm    LOS: 5 days   LYLES,CHARLES 01/19/2012,8:48 AM  Patient seen and examined and agree with assessment and plan as above.  Vinson Moselle  MD [employer] [phone] pgr    [phone] cell 01/19/2012, 1:05 PM

## 2012-01-20 LAB — GLUCOSE, CAPILLARY
Glucose-Capillary: 193 mg/dL — ABNORMAL HIGH (ref 70–99)
Glucose-Capillary: 311 mg/dL — ABNORMAL HIGH (ref 70–99)

## 2012-01-20 LAB — VANCOMYCIN, RANDOM: Vancomycin Rm: 29.4 ug/mL

## 2012-01-20 NOTE — Progress Notes (Signed)
Subjective:   No complaints, feeling well.  Objective: Vital signs in last 24 hours: Temp:  [96.8 F (36 C)-98.9 F (37.2 C)] 98.4 F (36.9 C) (05/26 0519) Pulse Rate:  [82-99] 82  (05/26 0519) Resp:  [16-20] 18  (05/26 0519) BP: (86-171)/(50-91) 130/67 mmHg (05/26 0519) SpO2:  [77 %-99 %] 99 % (05/26 0519) Weight:  [79.6 kg (175 lb 7.8 oz)-85.2 kg (187 lb 13.3 oz)] 81.8 kg (180 lb 5.4 oz) (05/25 2138) Weight change: 0 kg (0 lb)  Intake/Output from previous day: 05/25 0701 - 05/26 0700 In: 500 [P.O.:500] Out: 3036  Intake/Output this shift: Total I/O In: 380 [P.O.:380] Out: -  EXAM: General appearance:  Alert, in no ap-parent distress Resp:  CTA bilaterally Cardio:  RRR with Gr II/VI systolic murmur GI:  + BS, soft and nontender Extremities:  No edema Access: Left IJ catheter  Lab Results:  Basename 01/19/12 0844 01/18/12 0540  WBC 8.8 10.4  HGB 7.5* 7.6*  HCT 23.8* 24.4*  PLT 385 331   BMET:  Basename 01/19/12 0844 01/17/12 0733  NA 132* 137  K 2.9* 3.3*  CL 91* 98  CO2 28 25  GLUCOSE 221* 76  BUN 34* 31*  CREATININE 4.43* 4.38*  CALCIUM 9.3 9.1  ALBUMIN 1.7* --   No results found for this basename: PTH:2 in the last 72 hours Iron Studies: No results found for this basename: IRON,TIBC,TRANSFERRIN,FERRITIN in the last 72 hours  Dialysis Orders: Center: [facility] on tthsat . Have called [facility] for her information and have been told that she is noncompliant with staying her full treatment time.  EDW =81.5 kg HD Bath/ Time / Heparin /. Access / BFR / DFR /  Zemplar 0 mcg IV/HD Epogen 22,000 Units IV/HD Venofer 50mg  weekly   Assessment/Plan: 1. Cellulitis right leg > left leg - s/p aortobifemoral and R fem-pop bypass (5/3) with breakdown of groin wounds and lower-leg ulcers, likely worsened by increased edema; improving per VVS; on antibiotics with daily wound care. 2. ESRD / TTS [facility] - HD on TTS at [facility] in [place]; volume overload much better,  K 3.3.  3. Hypertension/volume - BP 130/67 on Clonidine 0.1 mg bid (other outpatient meds currently held); post-HD wt 81.8 kg yesterday s/p net UF 3 L (EDW 81.5kg). New lower EDW at dc 80kg or lower.  4. Anemia - Hgb 7.5, stable on Aranesp and weekly iron.  5. Metabolic bone disease - Phos 3.6 and Ca 9.3, on Sensipar and binder.  6. Nutrition -supplement as needed, high protein renal / diabetic restrictions.  7. S/P recent aortobifemoral bypass graft 5/3 - wounds improving, followed by VVS.  8. Severe ASCVD with CAD - meds and VVS.  9. Hx of bipolar disorder - meds.  10. IDDM - diet and insulin with ssi per adm    LOS: 6 days   [name],[name] 01/20/2012,6:56 AM  Patient seen and examined and agree with assessment and plan as above. Volume excess much better, continue HD on TTS.  Continuing with wound management per VVS. Cellulitis is better. Rec's as above.  Vinson Moselle  MD [employer] [phone] pgr    [phone] cell 01/20/2012, 2:01 PM

## 2012-01-20 NOTE — Progress Notes (Signed)
ANTIBIOTIC CONSULT NOTE - FOLLOW UP  Pharmacy Consult for Vancomycin Indication: [name]  Assessment: 41 yo female admitted with worsening bilateral lower extremity swelling, on Day#7 of vancomycin.  Random pre-HD level this morning was just slightly above goal at 29.4, though her HD session yesterday was 4 hours long at a BFR of 300 mL/hr.  Goal of Therapy:  Pre-HD vancomycin level 15-25 mcg/ml  Plan:  -Depending on rate and duration of next HD session on Tuesday, may need to reduce dose to 750mg  -Continue to monitor for clinical improvement -Follow HD tolerance  Bayard Beaver. Saul Fordyce, PharmD Pager: [phone] 01/20/2012,7:35 AM  Allergies  Allergen Reactions  . Compazine (Prochlorperazine) Swelling    Per echart records  . Contrast Media (Iodinated Diagnostic Agents)     Per echart records  . Meperidine Hcl Swelling  . Metformin And Related Other (See Comments)    'just not myself, was told not to take metformin'  . Morphine Swelling  . Nubain (Nalbuphine Hcl) Swelling  . Topiramate (Topamax) Swelling    Tongue swelling - per echart records  . Toradol (Ketorolac Tromethamine) Swelling    Patient Measurements: Height: 5\' 10"  (177.8 cm) Weight: 180 lb 5.4 oz (81.8 kg) IBW/kg (Calculated) : 68.5   Vital Signs: Temp: 98.4 F (36.9 C) (05/26 0519) Temp src: Oral (05/26 0519) BP: 130/67 mmHg (05/26 0519) Pulse Rate: 82  (05/26 0519) Intake/Output from previous day: 05/25 0701 - 05/26 0700 In: 740 [P.O.:740] Out: 3036  Intake/Output from this shift:    Labs:  Basename 01/19/12 0844 01/18/12 0540  WBC 8.8 10.4  HGB 7.5* 7.6*  PLT 385 331  LABCREA -- --  CREATININE 4.43* --   Estimated Creatinine Clearance: 17.9 ml/min (by C-G formula based on Cr of 4.43).  Basename 01/20/12 0550  VANCOTROUGH --  Leodis Binet --  VANCORANDOM 29.4  GENTTROUGH --  GENTPEAK --  GENTRANDOM --  TOBRATROUGH --  TOBRAPEAK --  TOBRARND --  AMIKACINPEAK --  AMIKACINTROU --    AMIKACIN --     Microbiology: Recent Results (from the past 720 hour(s))  SURGICAL PCR SCREEN     Status: Normal   Collection Time   12/24/11  3:14 PM      Component Value Range Status Comment   MRSA, PCR NEGATIVE  NEGATIVE  Final    Staphylococcus aureus NEGATIVE  NEGATIVE  Final   CULTURE, BLOOD (ROUTINE X 2)     Status: Normal   Collection Time   12/29/11  8:15 PM      Component Value Range Status Comment   Specimen Description BLOOD RIGHT ARM   Final    Special Requests BOTTLES DRAWN AEROBIC ONLY 3 ML   Final    Culture  Setup Time [id number]   Final    Culture NO GROWTH 5 DAYS   Final    Report Status 01/05/2012 FINAL   Final   CULTURE, BLOOD (ROUTINE X 2)     Status: Normal   Collection Time   12/29/11  8:25 PM      Component Value Range Status Comment   Specimen Description BLOOD RIGHT HAND   Final    Special Requests BOTTLES DRAWN AEROBIC ONLY 3 ML   Final    Culture  Setup Time [id number]   Final    Culture NO GROWTH 5 DAYS   Final    Report Status 01/05/2012 FINAL   Final   CLOSTRIDIUM DIFFICILE BY PCR     Status: Normal  Collection Time   01/04/12  8:12 PM      Component Value Range Status Comment   C difficile by pcr NEGATIVE  NEGATIVE  Final   STOOL CULTURE     Status: Normal   Collection Time   01/04/12  8:12 PM      Component Value Range Status Comment   Specimen Description STOOL   Final    Special Requests NONE   Final    Culture     Final    Value: NO SALMONELLA, SHIGELLA, CAMPYLOBACTER, YERSINIA, OR E.COLI 0157:H7 ISOLATED   Report Status 01/08/2012 FINAL   Final   CULTURE, BLOOD (ROUTINE X 2)     Status: Normal (Preliminary result)   Collection Time   01/14/12  3:00 PM      Component Value Range Status Comment   Specimen Description BLOOD HAND RIGHT   Final    Special Requests BOTTLES DRAWN AEROBIC ONLY 3CC   Final    Culture  Setup Time [id number]   Final    Culture     Final    Value:        BLOOD CULTURE RECEIVED NO GROWTH TO DATE CULTURE  WILL BE HELD FOR 5 DAYS BEFORE ISSUING A FINAL NEGATIVE REPORT   Report Status PENDING   Incomplete   CULTURE, BLOOD (ROUTINE X 2)     Status: Normal (Preliminary result)   Collection Time   01/14/12  3:10 PM      Component Value Range Status Comment   Specimen Description BLOOD FOREARM RIGHT   Final    Special Requests BOTTLES DRAWN AEROBIC ONLY 3CC   Final    Culture  Setup Time [id number]   Final    Culture     Final    Value:        BLOOD CULTURE RECEIVED NO GROWTH TO DATE CULTURE WILL BE HELD FOR 5 DAYS BEFORE ISSUING A FINAL NEGATIVE REPORT   Report Status PENDING   Incomplete     Anti-infectives     Start     Dose/Rate Route Frequency Ordered Stop   01/15/12 1200   vancomycin (VANCOCIN) IVPB 1000 mg/200 mL premix        1,000 mg 200 mL/hr over 60 Minutes Intravenous Every T-Th-Sa (Hemodialysis) 01/14/12 2018     01/14/12 1845   vancomycin (VANCOCIN) 750 mg in sodium chloride 0.9 % 150 mL IVPB        750 mg 150 mL/hr over 60 Minutes Intravenous To Emergency Dept 01/14/12 1831 01/14/12 2010   01/14/12 1530   vancomycin (VANCOCIN) IVPB 1000 mg/200 mL premix        1,000 mg 200 mL/hr over 60 Minutes Intravenous  Once 01/14/12 1522 01/14/12 1720

## 2012-01-20 NOTE — Progress Notes (Signed)
[facility] [place]  Subjective  - No complaints, wants to know when she can go home  Objective 130/67 82 98.4 F (36.9 C) (Oral) 18 99%  Intake/Output Summary (Last 24 hours) at 01/20/12 0805 Last data filed at 01/20/12 0700  Gross per 24 hour  Intake    740 ml  Output   3036 ml  Net  -2296 ml   Right and left groins with superficial exudate, but some granulation tissue forming No exposed graft Leg ulcers unchanged  Assessment/Planning: Continue local wound care Wounds still need some improvement prior to d/c home Importance of this emphasized to pt as well as risk of graft infection  [name] 01/20/2012 8:05 AM --  Laboratory Lab Results:  Basename 01/19/12 0844 01/18/12 0540  WBC 8.8 10.4  HGB 7.5* 7.6*  HCT 23.8* 24.4*  PLT 385 331   BMET  Basename 01/19/12 0844  NA 132*  K 2.9*  CL 91*  CO2 28  GLUCOSE 221*  BUN 34*  CREATININE 4.43*  CALCIUM 9.3    COAG Lab Results  Component Value Date   INR 1.14 12/28/2011   INR 1.07 12/24/2011   INR 0.96 12/20/2011   No results found for this basename: PTT    Antibiotics Anti-infectives     Start     Dose/Rate Route Frequency Ordered Stop   01/15/12 1200   vancomycin (VANCOCIN) IVPB 1000 mg/200 mL premix        1,000 mg 200 mL/hr over 60 Minutes Intravenous Every T-Th-Sa (Hemodialysis) 01/14/12 2018     01/14/12 1845   vancomycin (VANCOCIN) 750 mg in sodium chloride 0.9 % 150 mL IVPB        750 mg 150 mL/hr over 60 Minutes Intravenous To Emergency Dept 01/14/12 1831 01/14/12 2010   01/14/12 1530   vancomycin (VANCOCIN) IVPB 1000 mg/200 mL premix        1,000 mg 200 mL/hr over 60 Minutes Intravenous  Once 01/14/12 1522 01/14/12 1720

## 2012-01-21 LAB — CULTURE, BLOOD (ROUTINE X 2)
Culture  Setup Time: 201305210217
Culture  Setup Time: 201305210217
Culture: NO GROWTH
Culture: NO GROWTH

## 2012-01-21 LAB — GLUCOSE, CAPILLARY: Glucose-Capillary: 93 mg/dL (ref 70–99)

## 2012-01-21 LAB — CREATININE, SERUM: GFR calc non Af Amer: 13 mL/min — ABNORMAL LOW (ref >90)

## 2012-01-21 MED ORDER — HEPARIN SODIUM (PORCINE) 1000 UNIT/ML DIALYSIS
20.0000 [IU]/kg | INTRAMUSCULAR | Status: DC | PRN
  Filled 2012-01-21: qty 2

## 2012-01-21 MED ORDER — RENA-VITE PO TABS
1.0000 | ORAL_TABLET | Freq: Every day | ORAL | Status: DC
  Administered 2012-01-21: 1 via ORAL
  Filled 2012-01-21 (×2): qty 1

## 2012-01-21 NOTE — Progress Notes (Signed)
Pt's family left room, pt continues to refuse bed alarm. Educated pt on importance of calling before getting out of bed.

## 2012-01-21 NOTE — Progress Notes (Signed)
[facility] [place]  Subjective  - No complaints  Objective 121/76 99 98.1 F (36.7 C) (Oral) 16 92%  Intake/Output Summary (Last 24 hours) at 01/21/12 0805 Last data filed at 01/20/12 1800  Gross per 24 hour  Intake    950 ml  Output      1 ml  Net    949 ml   Bilateral groin wounds no significant change  Assessment/Planning: Continue Santyl and intermittent debridement  Addalynn Kumari E 01/21/2012 8:05 AM --  Laboratory Lab Results:  [facility] 01/19/12 0844  WBC 8.8  HGB 7.5*  HCT 23.8*  PLT 385   BMET  Basename 01/21/12 0545 01/19/12 0844  NA -- 132*  K -- 2.9*  CL -- 91*  CO2 -- 28  GLUCOSE -- 221*  BUN -- 34*  CREATININE 4.02* 4.43*  CALCIUM -- 9.3    COAG Lab Results  Component Value Date   INR 1.14 12/28/2011   INR 1.07 12/24/2011   INR 0.96 12/20/2011   No results found for this basename: PTT    Antibiotics Anti-infectives     Start     Dose/Rate Route Frequency Ordered Stop   01/15/12 1200   vancomycin (VANCOCIN) IVPB 1000 mg/200 mL premix        1,000 mg 200 mL/hr over 60 Minutes Intravenous Every T-Th-Sa (Hemodialysis) 01/14/12 2018     01/14/12 1845   vancomycin (VANCOCIN) 750 mg in sodium chloride 0.9 % 150 mL IVPB        750 mg 150 mL/hr over 60 Minutes Intravenous To Emergency Dept 01/14/12 1831 01/14/12 2010   01/14/12 1530   vancomycin (VANCOCIN) IVPB 1000 mg/200 mL premix        1,000 mg 200 mL/hr over 60 Minutes Intravenous  Once 01/14/12 1522 01/14/12 1720

## 2012-01-21 NOTE — Progress Notes (Signed)
Subjective:   No current complaints, no dyspnea.  Objective: Vital signs in last 24 hours: Temp:  [97.5 F (36.4 C)-98.4 F (36.9 C)] 98.1 F (36.7 C) (05/27 0428) Pulse Rate:  [80-99] 99  (05/27 0428) Resp:  [16-19] 16  (05/27 0428) BP: (104-153)/(60-76) 121/76 mmHg (05/27 0428) SpO2:  [91 %-100 %] 92 % (05/27 0428) Weight:  [84.142 kg (185 lb 8 oz)] 84.142 kg (185 lb 8 oz) (05/26 2050) Weight change: -1.058 kg (-2 lb 5.3 oz)  Intake/Output from previous day: 05/26 0701 - 05/27 0700 In: 950 [P.O.:950] Out: 1 [Stool:1]   EXAM: General appearance:  Alert, in no apparent distress Resp:  CTA bilaterally Cardio:  RRR with Gr II/VI systolic murmur GI:  + BS, soft and nontender Extremities1 + edema Access:  Left IJ catheter  Lab Results:  Basename 01/19/12 0844  WBC 8.8  HGB 7.5*  HCT 23.8*  PLT 385   BMET:  Basename 01/21/12 0545 01/19/12 0844  NA -- 132*  K -- 2.9*  CL -- 91*  CO2 -- 28  GLUCOSE -- 221*  BUN -- 34*  CREATININE 4.02* 4.43*  CALCIUM -- 9.3  ALBUMIN -- 1.7*   No results found for this basename: PTH:2 in the last 72 hours Iron Studies: No results found for this basename: IRON,TIBC,TRANSFERRIN,FERRITIN in the last 72 hours  Assessment/Plan:  1. Cellulitis - s/p aortobifemoral and R fem-pop bypass (5/3) with breakdown of groin wounds and lower-leg ulcers (R > L), likely worsened by increased edema; improving per VVS; on antibiotics with daily wound care. 2. ESRD - HD on TTS at [facility] in [place]; history of noncompliance with HD, but volume overload now much better, K 3.3.  HD tomorrow. Does not need Na bicarb 3. Hypertension/volume - BP 130/67 on Clonidine 0.1 mg bid (other outpatient meds currently held); wt 84.1 kg yesterday (EDW 81.5kg). New lower EDW at dc 80kg or lower. D/C Clonidine with BP controlled. 4. Anemia - Hgb 7.5 yesterday, stable on Aranesp on Tues and weekly iron.  5. Metabolic bone disease - Phos 3.6 and Ca 9.3, on Sensipar and  binder.  6. Nutrition -supplement as needed, high protein renal / diabetic restrictions.  7. S/P recent aortobifemoral bypass graft 5/3 - wounds improving, followed by VVS.  8. Severe ASCVD with CAD - meds and VVS.  9. Hx of bipolar disorder - meds.  10. IDDM - diet and insulin with ssi per adm   LOS: 7 days   LYLES,CHARLES 01/21/2012,8:20 AM I have seen and examined this patient and agree with the plan of care with changes noted  Emalee Knies L 01/21/2012, 9:59 AM

## 2012-01-21 NOTE — Progress Notes (Signed)
Pt refusing bed alarm, family present in room, they will help her ambulate to and from bathroom.

## 2012-01-22 ENCOUNTER — Inpatient Hospital Stay (HOSPITAL_COMMUNITY): Payer: Medicare Other

## 2012-01-22 LAB — RENAL FUNCTION PANEL
CO2: 28 mEq/L (ref 19–32)
Calcium: 9.6 mg/dL (ref 8.4–10.5)
Chloride: 94 mEq/L — ABNORMAL LOW (ref 96–112)
Creatinine, Ser: 5.03 mg/dL — ABNORMAL HIGH (ref 0.50–1.10)
GFR calc Af Amer: 11 mL/min — ABNORMAL LOW (ref >90)
Glucose, Bld: 106 mg/dL — ABNORMAL HIGH (ref 70–99)

## 2012-01-22 LAB — CBC
HCT: 26.4 % — ABNORMAL LOW (ref 36.0–46.0)
Hemoglobin: 8 g/dL — ABNORMAL LOW (ref 12.0–15.0)
MCH: 30.9 pg (ref 26.0–34.0)
MCV: 101.9 fL — ABNORMAL HIGH (ref 78.0–100.0)
RBC: 2.59 MIL/uL — ABNORMAL LOW (ref 3.87–5.11)
WBC: 14.1 10*3/uL — ABNORMAL HIGH (ref 4.0–10.5)

## 2012-01-22 LAB — GLUCOSE, CAPILLARY: Glucose-Capillary: 110 mg/dL — ABNORMAL HIGH (ref 70–99)

## 2012-01-22 MED ORDER — OXYCODONE-ACETAMINOPHEN 5-325 MG PO TABS: 1.0000 | ORAL_TABLET | ORAL | Status: AC

## 2012-01-22 MED ORDER — DARBEPOETIN ALFA-POLYSORBATE 200 MCG/0.4ML IJ SOLN
INTRAMUSCULAR | Status: AC
  Administered 2012-01-22: 200 ug via INTRAVENOUS
  Filled 2012-01-22: qty 0.4

## 2012-01-22 MED ORDER — ALPRAZOLAM 0.5 MG PO TABS
ORAL_TABLET | ORAL | Status: AC
  Administered 2012-01-22: 1 mg via ORAL
  Filled 2012-01-22: qty 1

## 2012-01-22 MED ORDER — ALPRAZOLAM 0.5 MG PO TABS
ORAL_TABLET | ORAL | Status: AC
  Filled 2012-01-22: qty 1

## 2012-01-22 MED ORDER — COLLAGENASE 250 UNIT/GM EX OINT: TOPICAL_OINTMENT | Freq: Every day | CUTANEOUS | Status: DC

## 2012-01-22 NOTE — Discharge Summary (Signed)
[facility] Discharge Summary   Patient ID:  [name] MRN: [id number] DOB/AGE: 03-Dec-1970 41 y.o.  Admit date: 01/14/2012 Discharge date: 01/22/2012 Date of Surgery: * No surgery found * Surgeon:   Admission Diagnosis: End stage renal disease on dialysis [585.6] Cellulitis of right lower leg [682.6] abd pain, swelling  Discharge Diagnoses:  End stage renal disease on dialysis [585.6] Cellulitis of right lower leg [682.6] abd pain, swelling  Secondary Diagnoses: Past Medical History  Diagnosis Date  . DVT (deep venous thrombosis)     DVT HISTORY  . Diabetes mellitus   . Migraine     HISTORY  . Anxiety and depression   . CAD (coronary artery disease)     Last cath 2009:  LAD stent 80% stenosis. Circumflex 60-70% stenosis AV groove, right coronary artery 50-60% stenosis. She did have cutting balloon angioplasty of the LAD lesion into a diagonal. However, there was restenosis of this and it was managed medically.  . Bipolar 1 disorder   . Hyperlipidemia   . Hyperparathyroidism   . PE (pulmonary embolism)     HISTORY  . Dialysis patient   . Anemia   . Renal failure     dialysis [facility] t,th sat  . Nephrotic syndrome   . Peripheral vascular disease   . MRSA infection   . Anxiety   . Depression   . Gastroparesis   . Complication of anesthesia   . PONV (postoperative nausea and vomiting)     x3 days post anesth. on 12/19/2011  . Myocardial infarction     x3 , last one - 10 yrs. ago  . Hypertension     sees Dr. Antoine Poche - [place], last stress test 11/2011, see result in EPIC, saw Dr. Antoine Poche as well at that time    . Shortness of breath   . Recurrent upper respiratory infection (URI)     Bronchitis 11/2011 - saw Dr. Loney Hering, treating currently /w antibiotic   . Pneumonia     [facility]- 2012  . GERD (gastroesophageal reflux disease)       Discharged Condition: stable  HPI: The patient is a 41 year old female with extensive past medical history. She  has severe peripheral vascular occlusive disease and is status post aortobifemoral bypass and right femoral to popliteal bypass above the knee with Gore-Tex graft by Dr. Myra Gianotti on 12/28/2011. She does have chronic renal failure and had her last hemodialysis 2 days ago. She presents to the emergency room this evening with worsening bilateral lower extremity swelling. He does have significant erythema in her right leg. Her aortofemoral bypass had been for nonhealing ulcers on her right lower extremity. She does have a history of MRSA.    Hospital Course:  [name] is a 41 y.o. female is S/P Bilateral  Extubated: POD # 0 Post-op wounds draining or dehisced Pt. Ambulating, voiding and taking PO diet without difficulty. Pt pain controlled with PO pain meds. Labs as below Complications: IV vancomycin and BID wet to dry dressing changes daily.  Cultures have been negative.  Consults:  Treatment Team:  Maree Krabbe, MD  Significant Diagnostic Studies: CBC Lab Results  Component Value Date   WBC 14.1* 01/22/2012   HGB 8.0* 01/22/2012   HCT 26.4* 01/22/2012   MCV 101.9* 01/22/2012   PLT 456* 01/22/2012    BMET    Component Value Date/Time   NA 135 01/22/2012 0727   K 3.9 01/22/2012 0727   CL 94* 01/22/2012 0727  CO2 28 01/22/2012 0727   GLUCOSE 106* 01/22/2012 0727   BUN 45* 01/22/2012 0727   CREATININE 5.03* 01/22/2012 0727   CREATININE 1.78* 10/17/2008 2000   CALCIUM 9.6 01/22/2012 0727   CALCIUM 8.3* 08/21/2011 0533   GFRNONAA 10* 01/22/2012 0727   GFRAA 11* 01/22/2012 0727   COAG Lab Results  Component Value Date   INR 1.14 12/28/2011   INR 1.07 12/24/2011   INR 0.96 12/20/2011     Disposition:  Discharge to :Home Discharge Orders    Future Appointments: Provider: Department: Dept Phone: Center:   01/28/2012 2:30 PM [facility] [phone] [facility]   01/28/2012 3:15 PM Nada Libman, MD [facility] [phone] [facility]     Future Orders Please Complete By  Expires   Resume previous diet      Driving Restrictions      Comments:   No driving for 6 weeks   Lifting restrictions      Comments:   No lifting for 6 weeks   Call MD for:  temperature >100.5      Call MD for:  redness, tenderness, or signs of infection (pain, swelling, bleeding, redness, odor or green/yellow discharge around incision site)      Call MD for:  severe or increased pain, loss or decreased feeling  in affected limb(s)      Increase activity slowly      Comments:   Walk with assistance use walker or cane as needed   May shower       Change dressing (specify)      Comments:   Wet to dry dressing changes twice per day.      Sporn, Abia, [place]  Home Medication Instructions [id number]   Printed on:01/22/12 1716  Medication Information                    insulin lispro (HUMALOG) 100 UNIT/ML injection Inject 7-15 Units into the skin 4 (four) times daily -  before meals and at bedtime. TAKING ON SLIDING SCALE 200-249=7 units 250-299=10 units 300-349=12 units >350=15 units           sucralfate (CARAFATE) 1 G tablet Take 1 g by mouth 2 (two) times daily before a meal.            clopidogrel (PLAVIX) 75 MG tablet Take 75 mg by mouth daily. Last dose 12/17/2011           PARoxetine (PAXIL) 40 MG tablet Take 80 mg by mouth every morning.            sevelamer (RENVELA) 800 MG tablet Take 1,600-4,000 mg by mouth 5 (five) times daily. *Take 5 tablets daily with each meal and take 2 tablets with snacks**           albuterol (PROVENTIL HFA;VENTOLIN HFA) 108 (90 BASE) MCG/ACT inhaler Inhale 2 puffs into the lungs every 6 (six) hours as needed. Shortness of breath            ARIPiprazole (ABILIFY) 2 MG tablet Take 2 mg by mouth daily.            cinacalcet (SENSIPAR) 30 MG tablet Take 30 mg by mouth daily with supper.            ALPRAZolam (XANAX) 1 MG tablet Take 1 tablet (1 mg total) by mouth 4 (four) times daily as needed for anxiety. For  anxiety  Polyethyl Glycol-Propyl Glycol (SYSTANE ULTRA) 0.4-0.3 % SOLN Place 1 drop into both eyes 4 (four) times daily.           ofloxacin (OCUFLOX) 0.3 % ophthalmic solution Place 1 drop into the right eye 3 (three) times daily.            promethazine (PHENERGAN) 50 MG/ML injection Inject 50 mg into the muscle every 6 (six) hours as needed. For nausea           Eyelid Cleansers (SYSTANE LID WIPES) PADS Place 1 application into the right eye daily.           temazepam (RESTORIL) 30 MG capsule Take 30 mg by mouth at bedtime.           traZODone (DESYREL) 100 MG tablet Take 300 mg by mouth at bedtime.            homatropine 5 % ophthalmic solution Place 1 drop into the right eye 3 (three) times daily.           omeprazole (PRILOSEC) 40 MG capsule Take 40 mg by mouth daily.            metoCLOPramide (REGLAN) 10 MG tablet Take 20 mg by mouth 4 (four) times daily.           promethazine (PHENERGAN) 25 MG tablet Take 25 mg by mouth every 6 (six) hours as needed. For nausea           oxyCODONE-acetaminophen (PERCOCET) 10-325 MG per tablet Take 1 tablet by mouth every 4 (four) hours. For pain           pyridoxine (B-6) 100 MG tablet Take 100 mg by mouth daily.           methocarbamol (ROBAXIN) 500 MG tablet Take 1,000 mg by mouth 4 (four) times daily as needed. For muscle spasms           insulin glargine (LANTUS) 100 UNIT/ML injection Inject 30 Units into the skin at bedtime.           butalbital-acetaminophen-caffeine (FIORICET, ESGIC) 50-325-40 MG per tablet Take 1 tablet by mouth 3 (three) times daily as needed. For pain           oxyCODONE-acetaminophen (PERCOCET) 5-325 MG per tablet Take 1 tablet by mouth every 4 (four) hours.           collagenase (SANTYL) ointment Apply topically daily.            Verbal and written Discharge instructions given to the patient. Wound care per Discharge AVS I spoke with Onalee Hua in the dialysis lab he will coordinate  that she gets vancomycin during dialysis for 2 more weeks. F/U in 1 week with Rusty NP during Dr. Elpidio Anis clinic.  Spoke with RN taking care of pt.  She states that renal wants BP meds discontinued and we went through the list for the meds that renal wanted discontinued.  SignedMosetta Pigeon 01/22/2012, 3:35 PM

## 2012-01-22 NOTE — Procedures (Signed)
I was present at this session.  I have reviewed the session itself and made appropriate changes.  Muriel Hannold L 5/28/20139:23 AM

## 2012-01-22 NOTE — Progress Notes (Signed)
[facility]  Subjective  - 41-year-old female with extensive past medical history. She has severe peripheral vascular occlusive disease and is status post aortobifemoral bypass and right femoral to popliteal bypass above the knee with Gore-Tex graft by Dr. Brabham on 12/28/2011. She does have chronic renal failure and had her last hemodialysis 2 days ago. She presents to the emergency room this evening with worsening bilateral lower extremity swelling. He does have significant erythema in her right leg. Her aortofemoral bypass had been for nonhealing ulcers on her right lower extremity. She does have a history of MRSA.   Objective 115/77 116 98.6 F (37 C) (Oral) 18 95%  Intake/Output Summary (Last 24 hours) at 01/22/12 1237 Last data filed at 01/22/12 1104  Gross per 24 hour  Intake    960 ml  Output   3000 ml  Net  -2040 ml   Right incisional wounds healing well.  Distal lateral lower leg wound no active drainage and no edema or erythema.   Bilateral groin wounds no change.   Assessment/Planning: Continue IV antibiotic vancomycin Continue wound care dressing changes Will discuss discharge planning with Dr. Brabham    Herb Beltre MAUREEN 01/22/2012 12:37 PM --  Laboratory Lab Results:  Basename 01/22/12 0727  WBC 14.1*  HGB 8.0*  HCT 26.4*  PLT 456*   BMET  Basename 01/22/12 0727 01/21/12 0545  NA 135 --  K 3.9 --  CL 94* --  CO2 28 --  GLUCOSE 106* --  BUN 45* --  CREATININE 5.03* 4.02*  CALCIUM 9.6 --    COAG Lab Results  Component Value Date   INR 1.14 12/28/2011   INR 1.07 12/24/2011   INR 0.96 12/20/2011   No results found for this basename: PTT    Antibiotics Anti-infectives     Start     Dose/Rate Route Frequency Ordered Stop   01/15/12 1200   vancomycin (VANCOCIN) IVPB 1000 mg/200 mL premix        1,000 mg 200 mL/hr over 60 Minutes Intravenous Every T-Th-Sa (Hemodialysis) 01/14/12 2018     01/14/12 1845    vancomycin (VANCOCIN) 750 mg in sodium chloride 0.9 % 150 mL IVPB        750 mg 150 mL/hr over 60 Minutes Intravenous To Emergency Dept 01/14/12 1831 01/14/12 2010   01/14/12 1530   vancomycin (VANCOCIN) IVPB 1000 mg/200 mL premix        1,000 mg 200 mL/hr over 60 Minutes Intravenous  Once 01/14/12 1522 01/14/12 1720              

## 2012-01-22 NOTE — Progress Notes (Signed)
Subjective:  No cos on hd Objective Vital signs in last 24 hours: Filed Vitals:   01/22/12 0645 01/22/12 0707 01/22/12 0730 01/22/12 0757  BP: 161/83 144/75 126/62 101/50  Pulse: 90 90 100 102  Temp:      TempSrc:      Resp: 20 20 18 18   Height:      Weight:      SpO2: 92%      Weight change: 0.558 kg (1 lb 3.7 oz)  Intake/Output Summary (Last 24 hours) at 01/22/12 0821 Last data filed at 01/22/12 0601  Gross per 24 hour  Intake    720 ml  Output      0 ml  Net    720 ml   Labs: Basic Metabolic Panel:  Lab 01/22/12 1610 01/21/12 0545 01/19/12 0844 01/17/12 0733 01/15/12 1359  NA 135 -- 132* 137 --  K 3.9 -- 2.9* 3.3* --  CL 94* -- 91* 98 --  CO2 28 -- 28 25 --  GLUCOSE 106* -- 221* 76 --  BUN 45* -- 34* 31* --  CREATININE 5.03* 4.02* 4.43* -- --  CALCIUM 9.6 -- 9.3 9.1 --  ALB -- -- -- -- --  PHOS 3.3 -- 3.6 -- 6.2*   Liver Function Tests:  Lab 01/22/12 0727 01/19/12 0844 01/15/12 1359  AST -- -- --  ALT -- -- --  ALKPHOS -- -- --  BILITOT -- -- --  PROT -- -- --  ALBUMIN 1.9* 1.7* 1.7*   No results found for this basename: LIPASE:3,AMYLASE:3 in the last 168 hours No results found for this basename: AMMONIA:3 in the last 168 hours CBC:  Lab 01/22/12 0727 01/19/12 0844 01/18/12 0540 01/17/12 0733 01/15/12 1359  WBC 14.1* 8.8 10.4 -- --  NEUTROABS -- -- -- -- --  HGB 8.0* 7.5* 7.6* -- --  HCT 26.4* 23.8* 24.4* -- --  MCV 101.9* 100.8* 100.0 99.6 99.6  PLT 456* 385 331 -- --   Cardiac Enzymes: No results found for this basename: CKTOTAL:5,CKMB:5,CKMBINDEX:5,TROPONINI:5 in the last 168 hours CBG:  Lab 01/21/12 2138 01/21/12 1636 01/21/12 1214 01/21/12 0804 01/20/12 2151  GLUCAP 181* 165* 92 93 311*    Iron Studies: No results found for this basename: IRON,TIBC,TRANSFERRIN,FERRITIN in the last 72 hours Studies/Results: No results found. Medications:      . antiseptic oral rinse  15 mL Mouth Rinse BID  . ARIPiprazole  2 mg Oral Daily  .  cinacalcet  30 mg Oral Q supper  . clopidogrel  75 mg Oral Daily  . collagenase   Topical Daily  . darbepoetin (ARANESP) injection - DIALYSIS  200 mcg Intravenous Q Tue-HD  . enoxaparin  30 mg Subcutaneous Q24H  . ferric gluconate (FERRLECIT/NULECIT) IV  62.5 mg Intravenous Weekly  . homatropine  1 drop Right Eye TID  . insulin aspart  7-15 Units Subcutaneous TID AC & HS  . insulin glargine  30 Units Subcutaneous QHS  . metoCLOPramide  20 mg Oral QID  . multivitamin  1 tablet Oral QHS  . ofloxacin  1 drop Right Eye TID  . oxyCODONE  5 mg Oral Q4H  . oxyCODONE-acetaminophen  1 tablet Oral Q4H  . pantoprazole  40 mg Oral Q1200  . PARoxetine  80 mg Oral Daily  . polyvinyl alcohol  1 drop Both Eyes QID  . pyridoxine  100 mg Oral Daily  . sevelamer  4,000 mg Oral TID WC  . sodium chloride  3 mL Intravenous Q12H  .  temazepam  30 mg Oral QHS  . traZODone  300 mg Oral QHS  . vancomycin  1,000 mg Intravenous Q T,Th,Sa-HD  . DISCONTD: cloNIDine  0.1 mg Oral BID  . DISCONTD: sodium bicarbonate  650 mg Oral BID  . DISCONTD: sucralfate  1 g Oral BID AC   I  have reviewed scheduled and prn medications.  Physical Exam: General: Alert, on hd, NAD Heart: RRR 2/6 sem lsb Lungs: Faint right rales L>R Abdomen: Obese, bs =+, soft and nontender, Liver down 6 cm Extremities: Dialysis Access: trace bipedal edema,Left ij perm cath   Problem/Plan: 1. Cellulitis right leg > left leg - per [facility] Plan of Care,waiting for wound healing further before dc on IV antibiotics, wound care; wt down almost 8 kg from admit with HD. Volume overload with edema contributing to problem with healing   2. ESRD / TTS [facility] - HD on TTS at [facility] in [place]; volume overload improving, K 3.9 Needing perm . Access when stable dw pt./ Dr. Fausto Skillern following outpt.  3. Hypertension/volume - BP 144/75. With Clonidine 0.1 mg bid dc yesterday (other outpatient meds currently held); pre-HD wt 84.8 kg today with UF goal of 4 L (EDW  81.5kg). New lower EDW at dc 80kg or lower . D/C on meds that did not get in hosp, and contributed to readmit 4. . Anemia - Hgb 8.0, improving (on admit 8.4) on Aranesp and weekly iron. 5.  Metabolic bone disease - Phos 3.3 and Ca 9.6, on Sensipar and binder. 6.  Nutrition -supplement as needed, high protein renal / diabetic restrictions. 7.  S/P recent aortobifemoral bypass graft 5/3 - wounds improving, followed by [facility] 8. . Severe ASCVD with CAD - meds and [facility] 9. DM variable control, montor and adjust 10. Marland Kitchen Hx of bipolar disorder - meds 11. Marland Kitchen IDDM - diet and insulin with ssi per adm    [name] Pastel, PA-C [facility] Beeper [phone] 01/22/2012,8:21 AM  LOS: 8 days I have seen and examined this patient and agree with the plan of care with changes noted.  [name] L 01/22/2012, 9:31 AM

## 2012-01-22 NOTE — Progress Notes (Signed)
Discharge instructions and medications discussed with pt and family. Pt showed no barriers to discharge. Assessment unchanged from morning. Home health PT/RN set up. IV removed. Dressing changes done just prior to discharge.

## 2012-01-22 NOTE — Progress Notes (Signed)
   CARE MANAGEMENT NOTE 01/22/2012  Patient:  [name], [name]   Account Number:  [id number]  Date Initiated:  01/22/2012  Documentation initiated by:  Kaliyan Osbourn  Subjective/Objective Assessment:   Pt for [facility] with [facility] and HHPT services and [facility] for [facility] aide.     Action/Plan:   Met with pt and faxed all d/c instructions and orders to both agencies.   Anticipated DC Date:  01/22/2012   Anticipated DC Plan:  HOME W HOME HEALTH SERVICES         [facility] Choice  HOME HEALTH   Choice offered to / List presented to:  C-1 Patient        HH arranged  HH-1 RN  HH-2 PT  HH-4 NURSE'S AIDE      HH agency  [facility]  [facility]   Status of service:  Completed, signed off Medicare Important Message given?   (If response is "NO", the following Medicare IM given date fields will be blank) Date Medicare IM given:   Date Additional Medicare IM given:    Discharge Disposition:  HOME W HOME HEALTH SERVICES  Per UR Regulation:    If discussed at Long Length of Stay Meetings, dates discussed:    Comments:

## 2012-01-25 ENCOUNTER — Encounter: Payer: Self-pay | Admitting: Surgery

## 2012-01-28 ENCOUNTER — Ambulatory Visit: Payer: Medicare Other | Admitting: Surgery

## 2012-01-28 ENCOUNTER — Telehealth: Payer: Self-pay | Admitting: Surgery

## 2012-01-28 NOTE — Telephone Encounter (Signed)
I called the patient to reschedule her no show appointment today, spoke with patients sister to reschedule. She stated that she has been changing the dressings and everything looks fine. I informed her that this was a 3 week follow-up and the patient should come in as soon as possible. Patient had conflicting appointments today and could not make it. Patients sister stated that Dr. Myra Gianotti mentioned that he would be on vacation on the 17th, she wished to schedule for a week after that. Is this ok, or should she come in to see Toney Sang on a VWB day before July 1st?

## 2012-01-28 NOTE — Telephone Encounter (Signed)
I believe she should come in June 10th next week because July 1st is too far out.

## 2012-01-30 ENCOUNTER — Other Ambulatory Visit (INDEPENDENT_AMBULATORY_CARE_PROVIDER_SITE_OTHER): Payer: Self-pay | Admitting: Internal Medicine

## 2012-01-30 ENCOUNTER — Telehealth: Payer: Self-pay | Admitting: Surgery

## 2012-01-30 NOTE — Telephone Encounter (Signed)
Per Judy/Carol staff message, patient should be seen sooner than July 1st. I spoke with the patient to confirm new appointment date and time.

## 2012-01-31 IMAGING — CR DG CHEST 2V
2 series · 2 of 2 positions shown · non-contrast
Comparison: 12/29/2009

CLINICAL DATA: Preop for dialysis access

CHEST - 2 VIEW

[view not recorded (1 of 2)]
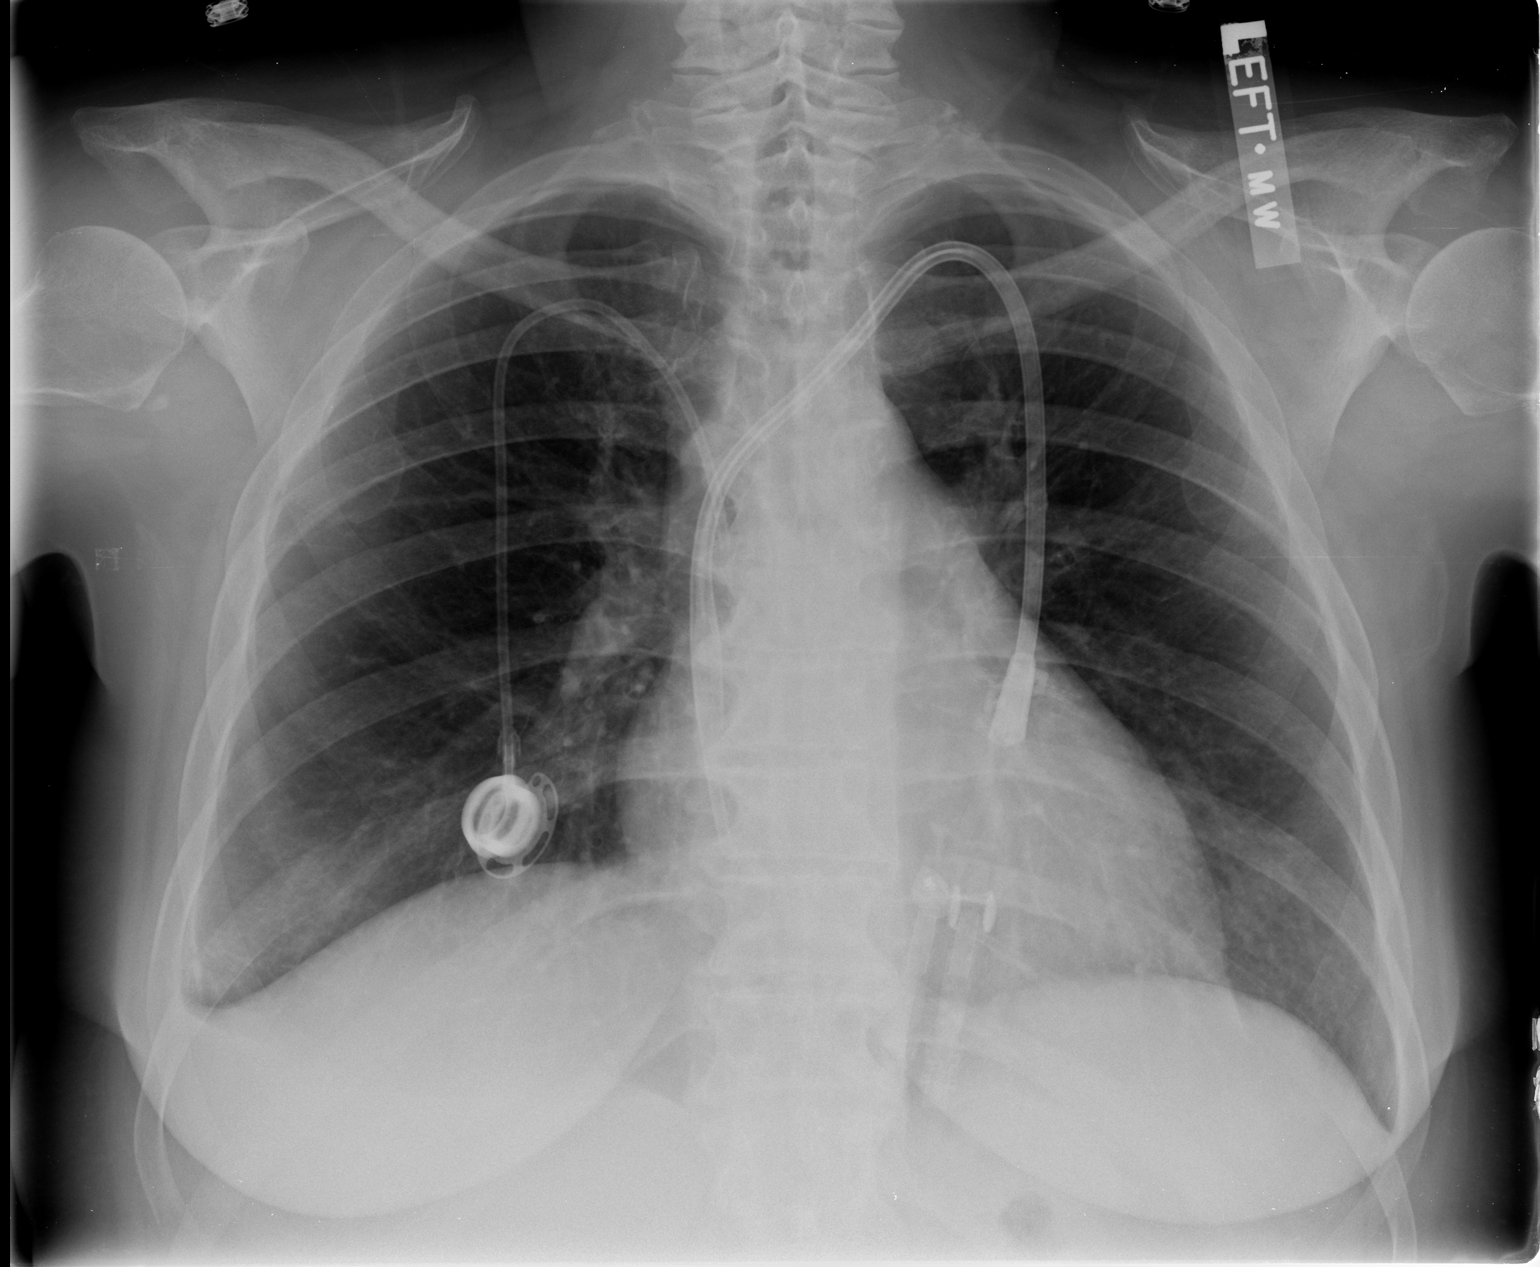

[view not recorded (2 of 2)]
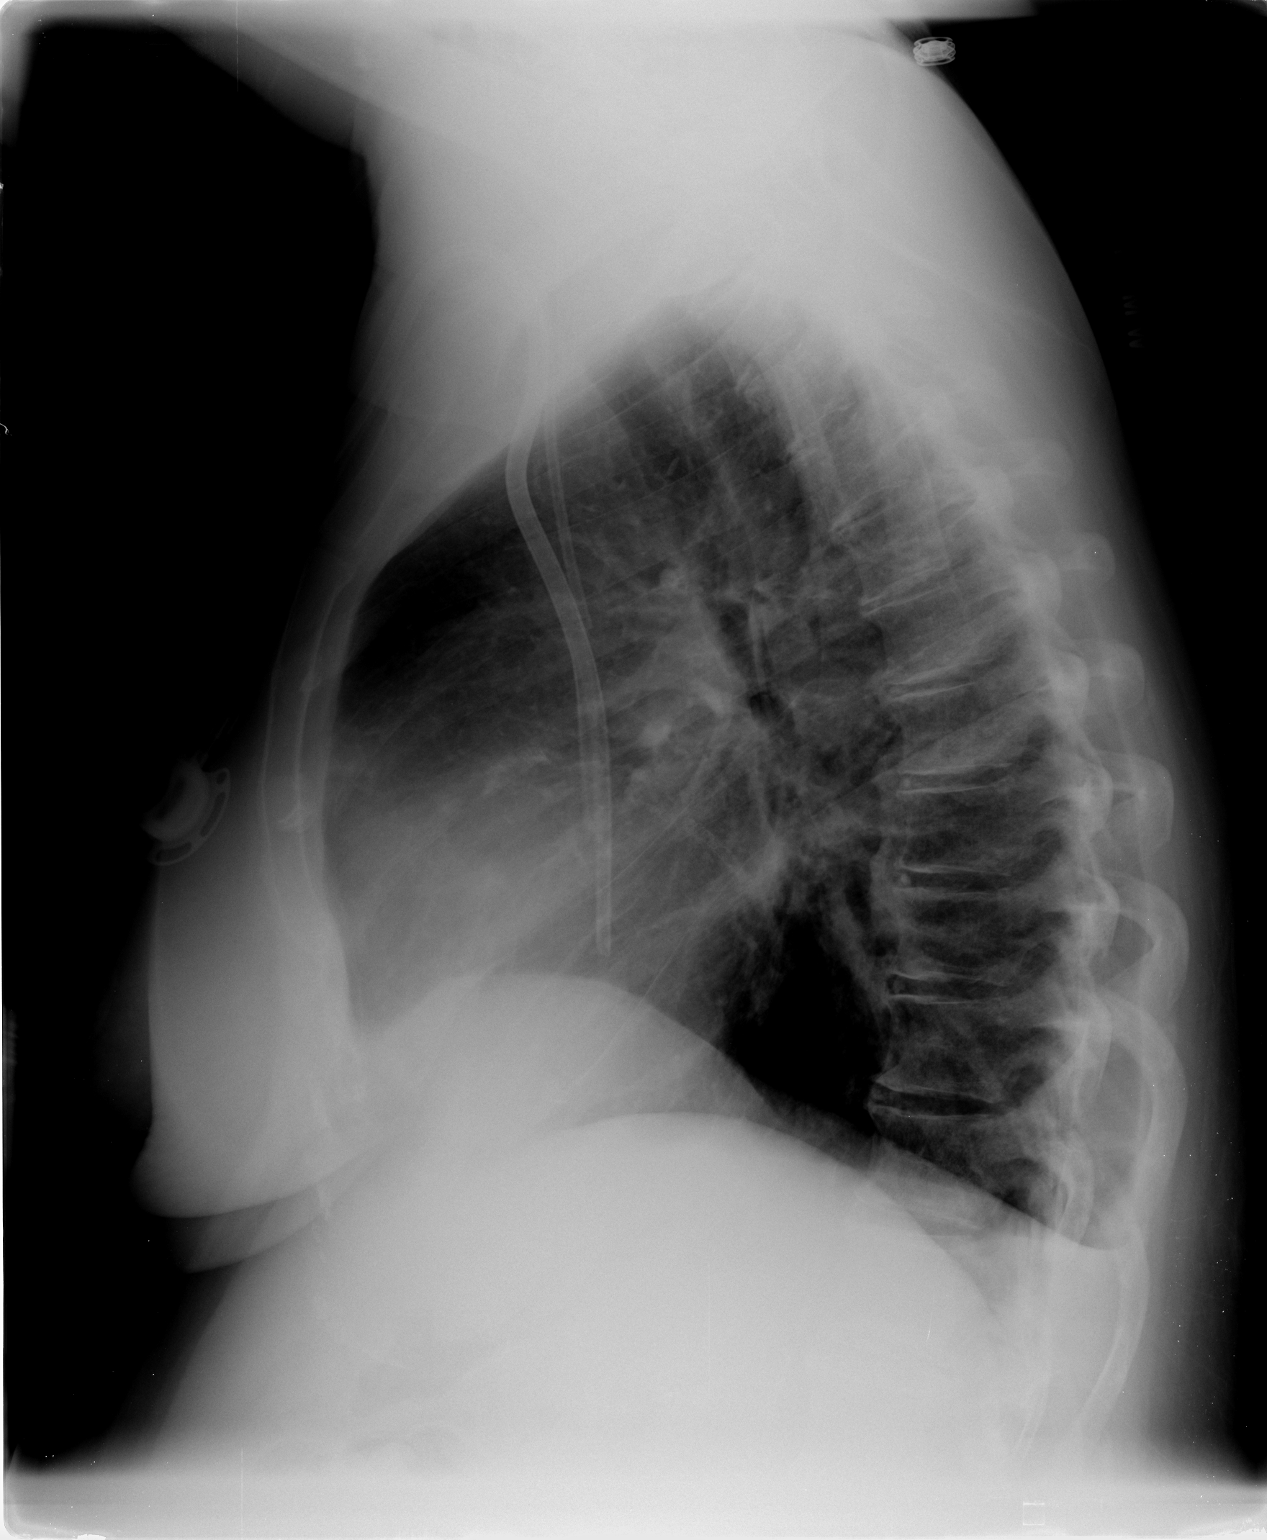

[2 of 2 positions shown; findings below may reference images not displayed]

FINDINGS: Heart size upper normal.  No congestive heart failure.
There is blunting of the right costophrenic angle which may
indicate a small pleural effusion or pleural thickening.  The lungs
are clear.  Port-A-Cath and dialysis catheters unchanged.
IMPRESSION: Small right pleural effusion versus pleural thickening - otherwise
negative for active disease.

## 2012-02-01 ENCOUNTER — Other Ambulatory Visit: Payer: Self-pay | Admitting: Medical

## 2012-02-01 ENCOUNTER — Encounter: Payer: Self-pay | Admitting: Surgery

## 2012-02-01 ENCOUNTER — Other Ambulatory Visit (INDEPENDENT_AMBULATORY_CARE_PROVIDER_SITE_OTHER): Payer: Self-pay | Admitting: Internal Medicine

## 2012-02-02 NOTE — Discharge Summary (Signed)
Agree with above  [name] 

## 2012-02-04 ENCOUNTER — Ambulatory Visit (INDEPENDENT_AMBULATORY_CARE_PROVIDER_SITE_OTHER): Payer: Medicare Other | Admitting: Surgery

## 2012-02-04 ENCOUNTER — Encounter (INDEPENDENT_AMBULATORY_CARE_PROVIDER_SITE_OTHER): Payer: Medicare Other | Admitting: *Deleted

## 2012-02-04 ENCOUNTER — Encounter: Payer: Self-pay | Admitting: Surgery

## 2012-02-04 VITALS — BP 155/78 | HR 97 | Temp 97.4°F | Ht 70.5 in | Wt 180.0 lb

## 2012-02-04 DIAGNOSIS — I7092 Chronic total occlusion of artery of the extremities: Secondary | ICD-10-CM

## 2012-02-04 DIAGNOSIS — I70409 Unspecified atherosclerosis of autologous vein bypass graft(s) of the extremities, unspecified extremity: Secondary | ICD-10-CM

## 2012-02-04 DIAGNOSIS — Z48812 Encounter for surgical aftercare following surgery on the circulatory system: Secondary | ICD-10-CM

## 2012-02-04 DIAGNOSIS — I70219 Atherosclerosis of native arteries of extremities with intermittent claudication, unspecified extremity: Secondary | ICD-10-CM

## 2012-02-04 NOTE — Progress Notes (Signed)
The patient is status post aortobifemoral bypass graft and right femoral-popliteal bypass graft done for limb salvage for 2 ulcers on her right leg. She was readmitted to the hospital with severe swelling as well as breakdown of her groin incisions. She comes back today for followup. Her groin incisions are asked the granulating in nicely. She still has a fair amount of fluid on both legs. She is not done a good job of controlling her fluid intake. 15 L of fluid were taken off on dialysis over the weekend.  I'm going to place a wound VAC on both groins. I am also point to place a Profore on the right leg to facilitate healing of her ulcers. I feel that the edema is inhibiting healing of her wounds. Amanda see her back in 3 weeks 

## 2012-02-12 ENCOUNTER — Other Ambulatory Visit: Payer: Self-pay | Admitting: *Deleted

## 2012-02-13 ENCOUNTER — Encounter (HOSPITAL_COMMUNITY): Payer: Self-pay | Admitting: Pharmacy Technician

## 2012-02-14 ENCOUNTER — Inpatient Hospital Stay (HOSPITAL_COMMUNITY): Payer: Medicare Other

## 2012-02-14 ENCOUNTER — Encounter (HOSPITAL_COMMUNITY): Payer: Self-pay | Admitting: Certified Registered Nurse Anesthetist

## 2012-02-14 ENCOUNTER — Inpatient Hospital Stay (HOSPITAL_COMMUNITY)
Admission: AD | Admit: 2012-02-14 | Discharge: 2012-02-17 | DRG: 314 | Disposition: A | Discharge status: Home / Self Care | Payer: Medicare Other | Source: Other Acute Inpatient Hospital | Attending: Internal Medicine | Admitting: Internal Medicine

## 2012-02-14 DIAGNOSIS — R0602 Shortness of breath: Secondary | ICD-10-CM

## 2012-02-14 DIAGNOSIS — N186 End stage renal disease: Secondary | ICD-10-CM

## 2012-02-14 DIAGNOSIS — E1129 Type 2 diabetes mellitus with other diabetic kidney complication: Secondary | ICD-10-CM | POA: Diagnosis present

## 2012-02-14 DIAGNOSIS — Z7902 Long term (current) use of antithrombotics/antiplatelets: Secondary | ICD-10-CM

## 2012-02-14 DIAGNOSIS — R609 Edema, unspecified: Secondary | ICD-10-CM

## 2012-02-14 DIAGNOSIS — Y92009 Unspecified place in unspecified non-institutional (private) residence as the place of occurrence of the external cause: Secondary | ICD-10-CM

## 2012-02-14 DIAGNOSIS — I2510 Atherosclerotic heart disease of native coronary artery without angina pectoris: Secondary | ICD-10-CM

## 2012-02-14 DIAGNOSIS — R110 Nausea: Secondary | ICD-10-CM | POA: Diagnosis present

## 2012-02-14 DIAGNOSIS — G92 Toxic encephalopathy: Secondary | ICD-10-CM | POA: Diagnosis present

## 2012-02-14 DIAGNOSIS — I10 Essential (primary) hypertension: Secondary | ICD-10-CM

## 2012-02-14 DIAGNOSIS — R0989 Other specified symptoms and signs involving the circulatory and respiratory systems: Secondary | ICD-10-CM

## 2012-02-14 DIAGNOSIS — J449 Chronic obstructive pulmonary disease, unspecified: Secondary | ICD-10-CM | POA: Diagnosis present

## 2012-02-14 DIAGNOSIS — D649 Anemia, unspecified: Secondary | ICD-10-CM

## 2012-02-14 DIAGNOSIS — G929 Unspecified toxic encephalopathy: Secondary | ICD-10-CM | POA: Diagnosis present

## 2012-02-14 DIAGNOSIS — Z86711 Personal history of pulmonary embolism: Secondary | ICD-10-CM

## 2012-02-14 DIAGNOSIS — Z86718 Personal history of other venous thrombosis and embolism: Secondary | ICD-10-CM

## 2012-02-14 DIAGNOSIS — J4489 Other specified chronic obstructive pulmonary disease: Secondary | ICD-10-CM | POA: Diagnosis present

## 2012-02-14 DIAGNOSIS — E785 Hyperlipidemia, unspecified: Secondary | ICD-10-CM | POA: Diagnosis present

## 2012-02-14 DIAGNOSIS — O2230 Deep phlebothrombosis in pregnancy, unspecified trimester: Secondary | ICD-10-CM

## 2012-02-14 DIAGNOSIS — N2581 Secondary hyperparathyroidism of renal origin: Secondary | ICD-10-CM

## 2012-02-14 DIAGNOSIS — L97909 Non-pressure chronic ulcer of unspecified part of unspecified lower leg with unspecified severity: Secondary | ICD-10-CM

## 2012-02-14 DIAGNOSIS — I6529 Occlusion and stenosis of unspecified carotid artery: Secondary | ICD-10-CM

## 2012-02-14 DIAGNOSIS — G8929 Other chronic pain: Secondary | ICD-10-CM | POA: Diagnosis present

## 2012-02-14 DIAGNOSIS — T80218A Other infection due to central venous catheter, initial encounter: Principal | ICD-10-CM | POA: Diagnosis present

## 2012-02-14 DIAGNOSIS — F064 Anxiety disorder due to known physiological condition: Secondary | ICD-10-CM | POA: Diagnosis present

## 2012-02-14 DIAGNOSIS — D631 Anemia in chronic kidney disease: Secondary | ICD-10-CM | POA: Diagnosis present

## 2012-02-14 DIAGNOSIS — E1159 Type 2 diabetes mellitus with other circulatory complications: Secondary | ICD-10-CM | POA: Diagnosis present

## 2012-02-14 DIAGNOSIS — I70219 Atherosclerosis of native arteries of extremities with intermittent claudication, unspecified extremity: Secondary | ICD-10-CM

## 2012-02-14 DIAGNOSIS — Z888 Allergy status to other drugs, medicaments and biological substances status: Secondary | ICD-10-CM

## 2012-02-14 DIAGNOSIS — K219 Gastro-esophageal reflux disease without esophagitis: Secondary | ICD-10-CM | POA: Diagnosis present

## 2012-02-14 DIAGNOSIS — Y841 Kidney dialysis as the cause of abnormal reaction of the patient, or of later complication, without mention of misadventure at the time of the procedure: Secondary | ICD-10-CM | POA: Diagnosis present

## 2012-02-14 DIAGNOSIS — I82409 Acute embolism and thrombosis of unspecified deep veins of unspecified lower extremity: Secondary | ICD-10-CM

## 2012-02-14 DIAGNOSIS — Z79899 Other long term (current) drug therapy: Secondary | ICD-10-CM

## 2012-02-14 DIAGNOSIS — R072 Precordial pain: Secondary | ICD-10-CM

## 2012-02-14 DIAGNOSIS — F319 Bipolar disorder, unspecified: Secondary | ICD-10-CM

## 2012-02-14 DIAGNOSIS — E11622 Type 2 diabetes mellitus with other skin ulcer: Secondary | ICD-10-CM

## 2012-02-14 DIAGNOSIS — E118 Type 2 diabetes mellitus with unspecified complications: Secondary | ICD-10-CM

## 2012-02-14 DIAGNOSIS — N390 Urinary tract infection, site not specified: Secondary | ICD-10-CM

## 2012-02-14 DIAGNOSIS — Z720 Tobacco use: Secondary | ICD-10-CM

## 2012-02-14 DIAGNOSIS — Z794 Long term (current) use of insulin: Secondary | ICD-10-CM

## 2012-02-14 DIAGNOSIS — Z91041 Radiographic dye allergy status: Secondary | ICD-10-CM

## 2012-02-14 DIAGNOSIS — Z8614 Personal history of Methicillin resistant Staphylococcus aureus infection: Secondary | ICD-10-CM

## 2012-02-14 DIAGNOSIS — Z9861 Coronary angioplasty status: Secondary | ICD-10-CM

## 2012-02-14 DIAGNOSIS — R4182 Altered mental status, unspecified: Secondary | ICD-10-CM

## 2012-02-14 DIAGNOSIS — I959 Hypotension, unspecified: Secondary | ICD-10-CM | POA: Diagnosis present

## 2012-02-14 DIAGNOSIS — E1143 Type 2 diabetes mellitus with diabetic autonomic (poly)neuropathy: Secondary | ICD-10-CM

## 2012-02-14 DIAGNOSIS — Z833 Family history of diabetes mellitus: Secondary | ICD-10-CM

## 2012-02-14 DIAGNOSIS — I739 Peripheral vascular disease, unspecified: Secondary | ICD-10-CM

## 2012-02-14 DIAGNOSIS — I120 Hypertensive chronic kidney disease with stage 5 chronic kidney disease or end stage renal disease: Secondary | ICD-10-CM | POA: Diagnosis present

## 2012-02-14 DIAGNOSIS — I798 Other disorders of arteries, arterioles and capillaries in diseases classified elsewhere: Secondary | ICD-10-CM | POA: Diagnosis present

## 2012-02-14 DIAGNOSIS — M2420 Disorder of ligament, unspecified site: Secondary | ICD-10-CM

## 2012-02-14 DIAGNOSIS — E119 Type 2 diabetes mellitus without complications: Secondary | ICD-10-CM

## 2012-02-14 DIAGNOSIS — J189 Pneumonia, unspecified organism: Secondary | ICD-10-CM

## 2012-02-14 DIAGNOSIS — M629 Disorder of muscle, unspecified: Secondary | ICD-10-CM

## 2012-02-14 DIAGNOSIS — N039 Chronic nephritic syndrome with unspecified morphologic changes: Secondary | ICD-10-CM | POA: Diagnosis present

## 2012-02-14 DIAGNOSIS — L97919 Non-pressure chronic ulcer of unspecified part of right lower leg with unspecified severity: Secondary | ICD-10-CM

## 2012-02-14 DIAGNOSIS — Z992 Dependence on renal dialysis: Secondary | ICD-10-CM

## 2012-02-14 DIAGNOSIS — Z823 Family history of stroke: Secondary | ICD-10-CM

## 2012-02-14 DIAGNOSIS — N058 Unspecified nephritic syndrome with other morphologic changes: Secondary | ICD-10-CM | POA: Diagnosis present

## 2012-02-14 DIAGNOSIS — F17200 Nicotine dependence, unspecified, uncomplicated: Secondary | ICD-10-CM | POA: Diagnosis present

## 2012-02-14 DIAGNOSIS — A419 Sepsis, unspecified organism: Secondary | ICD-10-CM

## 2012-02-14 DIAGNOSIS — K3184 Gastroparesis: Secondary | ICD-10-CM

## 2012-02-14 DIAGNOSIS — R0902 Hypoxemia: Secondary | ICD-10-CM

## 2012-02-14 LAB — GLUCOSE, CAPILLARY
Glucose-Capillary: 157 mg/dL — ABNORMAL HIGH (ref 70–99)
Glucose-Capillary: 194 mg/dL — ABNORMAL HIGH (ref 70–99)

## 2012-02-14 LAB — PROTIME-INR
INR: 1.18 (ref 0.00–1.49)
Prothrombin Time: 15.2 seconds (ref 11.6–15.2)

## 2012-02-14 LAB — POCT I-STAT 3, ART BLOOD GAS (G3+)
Acid-base deficit: 7 mmol/L — ABNORMAL HIGH (ref 0.0–2.0)
O2 Saturation: 35 %
O2 Saturation: 88 %
Patient temperature: 98.6
TCO2: 19 mmol/L (ref 0–100)
pCO2 arterial: 34.4 mmHg — ABNORMAL LOW (ref 35.0–45.0)
pH, Arterial: 7.321 — ABNORMAL LOW (ref 7.350–7.400)
pO2, Arterial: 56 mmHg — ABNORMAL LOW (ref 80.0–100.0)

## 2012-02-14 LAB — DIFFERENTIAL
Basophils Absolute: 0 10*3/uL (ref 0.0–0.1)
Basophils Relative: 0 % (ref 0–1)
Eosinophils Absolute: 0.1 10*3/uL (ref 0.0–0.7)
Eosinophils Relative: 0 % (ref 0–5)
Monocytes Absolute: 1.1 10*3/uL — ABNORMAL HIGH (ref 0.1–1.0)
Monocytes Relative: 5 % (ref 3–12)
Neutro Abs: 19 10*3/uL — ABNORMAL HIGH (ref 1.7–7.7)

## 2012-02-14 LAB — URINALYSIS, ROUTINE W REFLEX MICROSCOPIC
Ketones, ur: NEGATIVE mg/dL
Specific Gravity, Urine: 1.019 (ref 1.005–1.030)
Urobilinogen, UA: 0.2 mg/dL (ref 0.0–1.0)
pH: 5.5 (ref 5.0–8.0)

## 2012-02-14 LAB — CBC
HCT: 31 % — ABNORMAL LOW (ref 36.0–46.0)
Hemoglobin: 9.7 g/dL — ABNORMAL LOW (ref 12.0–15.0)
MCH: 30.8 pg (ref 26.0–34.0)
MCHC: 31.3 g/dL (ref 30.0–36.0)
RDW: 18 % — ABNORMAL HIGH (ref 11.5–15.5)

## 2012-02-14 LAB — COMPREHENSIVE METABOLIC PANEL
AST: 13 U/L (ref 0–37)
Albumin: 2.1 g/dL — ABNORMAL LOW (ref 3.5–5.2)
BUN: 61 mg/dL — ABNORMAL HIGH (ref 6–23)
Calcium: 10.1 mg/dL (ref 8.4–10.5)
Creatinine, Ser: 5.79 mg/dL — ABNORMAL HIGH (ref 0.50–1.10)
Total Bilirubin: 0.2 mg/dL — ABNORMAL LOW (ref 0.3–1.2)
Total Protein: 6.4 g/dL (ref 6.0–8.3)

## 2012-02-14 LAB — URINE MICROSCOPIC-ADD ON

## 2012-02-14 LAB — PROCALCITONIN: Procalcitonin: 2.96 ng/mL

## 2012-02-14 LAB — CARDIAC PANEL(CRET KIN+CKTOT+MB+TROPI)
CK, MB: 2.2 ng/mL (ref 0.3–4.0)
CK, MB: 2.5 ng/mL (ref 0.3–4.0)
Troponin I: <0.3 ng/mL (ref <0.30)

## 2012-02-14 LAB — LACTIC ACID, PLASMA: Lactic Acid, Venous: 1 mmol/L (ref 0.5–2.2)

## 2012-02-14 LAB — APTT: aPTT: 36 seconds (ref 24–37)

## 2012-02-14 MED ORDER — SODIUM CHLORIDE 0.9 % IV SOLN: 100.0000 mL | INTRAVENOUS | Status: DC | PRN

## 2012-02-14 MED ORDER — HEPARIN SODIUM (PORCINE) 5000 UNIT/ML IJ SOLN
5000.0000 [IU] | Freq: Three times a day (TID) | INTRAMUSCULAR | Status: DC
  Administered 2012-02-14 – 2012-02-17 (×7): 5000 [IU] via SUBCUTANEOUS
  Filled 2012-02-14 (×12): qty 1

## 2012-02-14 MED ORDER — CALCIUM ACETATE 667 MG PO CAPS
1334.0000 mg | ORAL_CAPSULE | Freq: Three times a day (TID) | ORAL | Status: DC
  Administered 2012-02-16 – 2012-02-17 (×3): 1334 mg via ORAL
  Filled 2012-02-14 (×11): qty 2

## 2012-02-14 MED ORDER — PANTOPRAZOLE SODIUM 40 MG IV SOLR: 40.0000 mg | Freq: Every evening | INTRAVENOUS | Status: DC | PRN

## 2012-02-14 MED ORDER — IPRATROPIUM BROMIDE 0.02 % IN SOLN
0.5000 mg | RESPIRATORY_TRACT | Status: DC
  Administered 2012-02-14 – 2012-02-15 (×4): 0.5 mg via RESPIRATORY_TRACT
  Filled 2012-02-14 (×8): qty 2.5

## 2012-02-14 MED ORDER — INSULIN ASPART 100 UNIT/ML ~~LOC~~ SOLN: 2.0000 [IU] | SUBCUTANEOUS | Status: DC | PRN

## 2012-02-14 MED ORDER — SODIUM CHLORIDE 0.9 % IV SOLN
INTRAVENOUS | Status: DC
  Administered 2012-02-14: 10 mL/h via INTRAVENOUS

## 2012-02-14 MED ORDER — ACETAMINOPHEN 325 MG PO TABS
650.0000 mg | ORAL_TABLET | ORAL | Status: DC | PRN
  Administered 2012-02-14 – 2012-02-17 (×4): 650 mg via ORAL
  Filled 2012-02-14 (×4): qty 2

## 2012-02-14 MED ORDER — ALTEPLASE 2 MG IJ SOLR
2.0000 mg | Freq: Once | INTRAMUSCULAR | Status: AC | PRN
  Administered 2012-02-14: 2 mg
  Filled 2012-02-14 (×3): qty 2

## 2012-02-14 MED ORDER — PANTOPRAZOLE SODIUM 40 MG IV SOLR
40.0000 mg | Freq: Every day | INTRAVENOUS | Status: DC
  Administered 2012-02-14 – 2012-02-16 (×3): 40 mg via INTRAVENOUS
  Filled 2012-02-14 (×4): qty 40

## 2012-02-14 MED ORDER — VANCOMYCIN HCL 1000 MG IV SOLR
750.0000 mg | Freq: Once | INTRAVENOUS | Status: AC
  Administered 2012-02-14: 750 mg via INTRAVENOUS
  Filled 2012-02-14: qty 750

## 2012-02-14 MED ORDER — LIDOCAINE-PRILOCAINE 2.5-2.5 % EX CREA
1.0000 "application " | TOPICAL_CREAM | CUTANEOUS | Status: DC | PRN
  Filled 2012-02-14: qty 5

## 2012-02-14 MED ORDER — VANCOMYCIN HCL IN DEXTROSE 1-5 GM/200ML-% IV SOLN: 1000.0000 mg | Freq: Two times a day (BID) | INTRAVENOUS | Status: DC

## 2012-02-14 MED ORDER — HEPARIN SODIUM (PORCINE) 1000 UNIT/ML DIALYSIS
1000.0000 [IU] | INTRAMUSCULAR | Status: DC | PRN
  Filled 2012-02-14: qty 1

## 2012-02-14 MED ORDER — PENTAFLUOROPROP-TETRAFLUOROETH EX AERO: 1.0000 "application " | INHALATION_SPRAY | CUTANEOUS | Status: DC | PRN

## 2012-02-14 MED ORDER — PIPERACILLIN-TAZOBACTAM IN DEX 2-0.25 GM/50ML IV SOLN
2.2500 g | Freq: Three times a day (TID) | INTRAVENOUS | Status: DC
  Administered 2012-02-14 – 2012-02-16 (×7): 2.25 g via INTRAVENOUS
  Filled 2012-02-14 (×10): qty 50

## 2012-02-14 MED ORDER — ALBUTEROL SULFATE (5 MG/ML) 0.5% IN NEBU
2.5000 mg | INHALATION_SOLUTION | RESPIRATORY_TRACT | Status: DC
  Administered 2012-02-14 – 2012-02-15 (×4): 2.5 mg via RESPIRATORY_TRACT
  Filled 2012-02-14 (×8): qty 0.5

## 2012-02-14 MED ORDER — RENA-VITE PO TABS
1.0000 | ORAL_TABLET | Freq: Every day | ORAL | Status: DC
  Administered 2012-02-16 – 2012-02-17 (×2): 1 via ORAL
  Filled 2012-02-14 (×4): qty 1

## 2012-02-14 MED ORDER — NEPRO/CARBSTEADY PO LIQD
237.0000 mL | ORAL | Status: DC | PRN
  Filled 2012-02-14: qty 237

## 2012-02-14 MED ORDER — HEPARIN SODIUM (PORCINE) 1000 UNIT/ML DIALYSIS
40.0000 [IU]/kg | Freq: Once | INTRAMUSCULAR | Status: AC
  Administered 2012-02-15: 3300 [IU] via INTRAVENOUS_CENTRAL

## 2012-02-14 MED ORDER — LIDOCAINE HCL (PF) 1 % IJ SOLN: 5.0000 mL | INTRAMUSCULAR | Status: DC | PRN

## 2012-02-14 MED ORDER — PIPERACILLIN-TAZOBACTAM 3.375 G IVPB: 3.3750 g | Freq: Four times a day (QID) | INTRAVENOUS | Status: DC

## 2012-02-14 NOTE — Progress Notes (Signed)
ANTIBIOTIC CONSULT NOTE - INITIAL  Pharmacy Consult for vancomycin/zosyn Indication: empiric for presumed UTI, PNA, B open groin wound infection  Allergies  Allergen Reactions  . Compazine (Prochlorperazine) Swelling    Per echart records  . Contrast Media (Iodinated Diagnostic Agents)     Per echart records  . Meperidine Hcl Swelling  . Metformin And Related Other (See Comments)    'just not myself, was told not to take metformin'  . Morphine Swelling  . Nubain (Nalbuphine Hcl) Swelling  . Topiramate (Topamax) Swelling    Tongue swelling - per echart records  . Toradol (Ketorolac Tromethamine) Swelling    Patient Measurements: Height: 5' 10.5" (179.1 cm) Weight: 180 lb (81.647 kg) IBW/kg (Calculated) : 69.65  Adjusted Body Weight:   Vital Signs: BP: 123/64 mmHg (06/20 1245) Pulse Rate: 103  (06/20 1300) Intake/Output from previous day:   Intake/Output from this shift:    Labs: No results found for this basename: WBC:3,HGB:3,PLT:3,LABCREA:3,CREATININE:3 in the last 72 hours Estimated Creatinine Clearance: 16 ml/min (by C-G formula based on Cr of 5.03). No results found for this basename: VANCOTROUGH:2,VANCOPEAK:2,VANCORANDOM:2,GENTTROUGH:2,GENTPEAK:2,GENTRANDOM:2,TOBRATROUGH:2,TOBRAPEAK:2,TOBRARND:2,AMIKACINPEAK:2,AMIKACINTROU:2,AMIKACIN:2, in the last 72 hours   Microbiology: No results found for this or any previous visit (from the past 720 hour(s)).  Medical History: Past Medical History  Diagnosis Date  . DVT (deep venous thrombosis)     DVT HISTORY  . Diabetes mellitus   . Migraine     HISTORY  . Anxiety and depression   . CAD (coronary artery disease)     Last cath 2009:  LAD stent 80% stenosis. Circumflex 60-70% stenosis AV groove, right coronary artery 50-60% stenosis. She did have cutting balloon angioplasty of the LAD lesion into a diagonal. However, there was restenosis of this and it was managed medically.  . Bipolar 1 disorder   . Hyperlipidemia     . Hyperparathyroidism   . PE (pulmonary embolism)     HISTORY  . Dialysis patient   . Anemia   . Renal failure     dialysis [place] t,th sat  . Nephrotic syndrome   . Peripheral vascular disease   . MRSA infection   . Anxiety   . Depression   . Gastroparesis   . Complication of anesthesia   . PONV (postoperative nausea and vomiting)     x3 days post anesth. on 12/19/2011  . Myocardial infarction     x3 , last one - 10 yrs. ago  . Hypertension     sees Dr. Antoine Poche - [place], last stress test 11/2011, see result in EPIC, saw Dr. Antoine Poche as well at that time    . Shortness of breath   . Recurrent upper respiratory infection (URI)     Bronchitis 11/2011 - saw Dr. Loney Hering, treating currently /w antibiotic   . Pneumonia     [facility]- 2012  . GERD (gastroesophageal reflux disease)     Medications:  Prescriptions prior to admission  Medication Sig Dispense Refill  . albuterol (PROVENTIL HFA;VENTOLIN HFA) 108 (90 BASE) MCG/ACT inhaler Inhale 2 puffs into the lungs every 6 (six) hours as needed. Shortness of breath      . ALPRAZolam (XANAX) 1 MG tablet Take 1 mg by mouth 4 (four) times daily as needed. For anxiety      . ARIPiprazole (ABILIFY) 2 MG tablet Take 2 mg by mouth daily.       . butalbital-acetaminophen-caffeine (FIORICET, ESGIC) 50-325-40 MG per tablet Take 1 tablet by mouth 3 (three) times daily as needed. For headaches.      Marland Kitchen  cinacalcet (SENSIPAR) 30 MG tablet Take 30 mg by mouth daily with supper.       . clopidogrel (PLAVIX) 75 MG tablet Take 75 mg by mouth daily. Last dose 12/17/2011      . Eyelid Cleansers (SYSTANE LID WIPES) PADS Place 1 application into the right eye daily.      . insulin glargine (LANTUS) 100 UNIT/ML injection Inject 30 Units into the skin at bedtime.      . insulin lispro (HUMALOG) 100 UNIT/ML injection Inject 7-15 Units into the skin 4 (four) times daily -  before meals and at bedtime. TAKING ON SLIDING SCALE 200-249=7 units 250-299=10 units 300-349=12  units >350=15 units      . methocarbamol (ROBAXIN) 500 MG tablet Take 1,000 mg by mouth 4 (four) times daily as needed. For muscle spasms      . metoCLOPramide (REGLAN) 10 MG tablet Take 20 mg by mouth 4 (four) times daily.      Marland Kitchen ofloxacin (OCUFLOX) 0.3 % ophthalmic solution Place 1 drop into the right eye 3 (three) times daily.       Marland Kitchen omeprazole (PRILOSEC) 40 MG capsule Take 40 mg by mouth daily.       Marland Kitchen oxyCODONE-acetaminophen (PERCOCET) 10-325 MG per tablet Take 1 tablet by mouth every 4 (four) hours. For pain      . PARoxetine (PAXIL) 40 MG tablet Take 80 mg by mouth every morning.       . promethazine (PHENERGAN) 25 MG tablet Take 25 mg by mouth every 6 (six) hours as needed. For nausea      . promethazine (PHENERGAN) 50 MG/ML injection Inject 50 mg into the muscle every 6 (six) hours as needed. For nausea      . pyridoxine (B-6) 100 MG tablet Take 100 mg by mouth daily.      . sevelamer (RENVELA) 800 MG tablet Take 1,600-4,000 mg by mouth 5 (five) times daily. *Take 5 tablets daily with each meal and take 2 tablets with snacks**      . sucralfate (CARAFATE) 1 G tablet Take 1 g by mouth 2 (two) times daily before a meal.       . temazepam (RESTORIL) 30 MG capsule Take 30 mg by mouth at bedtime.      . traZODone (DESYREL) 100 MG tablet Take 300 mg by mouth at bedtime.        Assessment: 41 yo F with HD dependent ESRD with tunnelled HD cath, s/p aorta bi fem 5/13 with B open groin wounds with green malodorous drainage.  Transferred from [facility] hospital where she is noted to have received vancomycin 1g at 0631 and zosyn 3.375g at 0729 today (6/20).   Goal of Therapy:  Pre HD vancomycin level 15-25 mcg/mL  Plan:  1.  Give additional 750 mg vancomycin to complete load of 1750 mg  2.  F/u HD plans for further vancomycin dosing 3.  Zosyn 2.25 g iv q8h 4.  F/u cx, clinical progress, vancomycin levels as needed  Francys Bolin L. Illene Bolus, PharmD, BCPS Clinical Pharmacist Pager:  [phone] 02/14/2012 1:27 PM

## 2012-02-14 NOTE — Consult Note (Signed)
WOC consult Note Reason for Consult:eval wounds of groins and RLE.  Pt S/P Aorta Bifem bypass 12/2011.  Seen in past by this WOC for RLE leg ulceration suspect for mixed etiology both arterial and venous components.  Pt does have doppler pedal pulses. Wound type: bil. Groin open non healing surgical sites and RLE mixed etiology leg ulcerations  Measurement: 1. RLE ulcer: 4cm x 4cm x 0.5cm  2. Right groin surgical site: 5.5cm x 3.0cmx 0.5cm 3. Left groin surgical site:  4cm x 3cm x 0.5cm   Wound bed: 1. RLE ulcer: early granulation tissue present (20%) with noted re epithelialization at wound edges (10%) with 70% yellow slough  2. Right groin surgical site: 10% yellow slough/skin necrosis at wound edges, pale, pink and moist 3. Left groin surgical site:  10% yellow slough/skin necrosis at wound edges, pale, pink and moist  Drainage (amount, consistency, odor)  1. RLE ulcer: moderate serous, with no odor 2. Right groin surgical site: NOTE wound fills immediately with clear fluid, suspect lymphatic leak in this site.  Made admitting service aware, and bedside nursing to notify vascular when they see her to check, may need to restart VAC to this wound to manage drainage best. Also this site presents with bright green drainage and odor, on abtx. Odor not consistent with pseudomonas typical odor. Bedside nursing will obtain wound cx of bil. Groin wounds. 3. Left groin surgical site: moderate serosanguinous drainage, no odor   Periwound: intact at all sites, noted however bilateral LE appear to have cellulitis, they are erythematous, and have some induration as well as warmth.    Dressing procedure/placement/frequency: 1. RLE ulcer: placed silver antimicrobial absorbant dressing today. This will control exudate and tx bioburdan since this is a chronic wound. Change every 3 days and PRN soilage 2. Right groin surgical site: Placed silver antimicrobial dressings due to drainage odor and color.  This  will probaly not suffice in this area due to the amount of fluid filling this wound. Will reeval tom and request vascular to possible order VAC/NPWT for this site 3. Left groin surgical site:  Silver antimicrobial dressing for drainage and for non healing status, change every 3 days and PRN soilage.   WOC will follow along with you for management of wounds [name] [name], [employer] [phone]

## 2012-02-14 NOTE — Progress Notes (Signed)
Foley catheter removed per Dr. Darrick Penna. Patient states that she makes urine at home and is able to ask for bedpan when needed.   Minus Liberty RN

## 2012-02-14 NOTE — H&P (Signed)
Name: [name] MRN: [id number] DOB: 1971/03/31    LOS: 0 Referring Provider:  [facility]  Reason for Referral:  AMS, fever.  PULMONARY / CRITICAL CARE MEDICINE  HPI:  [name] is a 41 yo female with a plethora of health problems. 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 She had aorta bi fem per [facility] 12/28/11 and has bilateral groin open wounds with green malodorous drainage. Further bilateral lower ext wounds are noted . She has hemodialysis Tuesday 6/18 and was in her usual poor state of health. 6-19 had low grade fever and on 6-19  fever 102.5. Taken to [facility] and tx with Vancomycin and Zosyn. She was hypertensive but met sirs criteria and was tx to [facility]. Note she is bipolar and smoke 3 PPD. PCCM asked to admit. Of note she was noted to have dirty urine and lung infiltrate at [facility] Her Lt vas cath is scheduled to be changed per Dr. Barry Dienes on 6/21.  Past Medical History  Diagnosis Date  . DVT (deep venous thrombosis)     DVT HISTORY  . Diabetes mellitus   . Migraine     HISTORY  . Anxiety and depression   . CAD (coronary artery disease)     Last cath 2009:  LAD stent 80% stenosis. Circumflex 60-70% stenosis AV groove, right coronary artery 50-60% stenosis. She did have cutting balloon angioplasty of the LAD lesion into a diagonal. However, there was restenosis of this and it was managed medically.  . Bipolar 1 disorder   . Hyperlipidemia   . Hyperparathyroidism   . PE (pulmonary embolism)     HISTORY  . Dialysis patient   . Anemia   . Renal failure     dialysis [place] t,th sat  . Nephrotic syndrome   . Peripheral vascular disease   . MRSA infection   . Anxiety   . Depression   . Gastroparesis   . Complication of anesthesia   . PONV (postoperative nausea and vomiting)     x3 days post anesth. on 12/19/2011  . Myocardial infarction     x3 , last one - 10 yrs. ago  . Hypertension     sees Dr.  Antoine Poche - [place], last stress test 11/2011, see result in EPIC, saw Dr. Antoine Poche as well at that time    . Shortness of breath   . Recurrent upper respiratory infection (URI)     Bronchitis 11/2011 - saw Dr. Loney Hering, treating currently /w antibiotic   . Pneumonia     APH- 2012  . GERD (gastroesophageal reflux disease)    Past Surgical History  Procedure Date  . Incise and drain abcess     OF THIGHS FROM INSULIN INJECTIONS  . Heart stents   . Portacath placement 2003  . Cataract extraction w/phaco 11/09/2011    Procedure: CATARACT EXTRACTION PHACO AND INTRAOCULAR LENS PLACEMENT (IOC);  Surgeon: Edmon Crape, MD;  Location: [facility] OR;  Service: Ophthalmology;  Laterality: Right;  . Pars plana vitrectomy 11/09/2011    Procedure: PARS PLANA VITRECTOMY WITH 23 GAUGE;  Surgeon: Edmon Crape, MD;  Location: [facility] OR;  Service: Ophthalmology;  Laterality: Right;  Ahmed Valve; Scleral Reinforcement Graft  . Dg av dialysis  shunt access exist*l* or 12/13/11    left arm  . Dialysis cath inserted & portacath      dialysis catheter inserted & portacath d/c'd- 12/19/2011  .  Back surgery X 4    x4 times   . Cholecystectomy   . Eye surgery   . Cardiac catheterization   . Aorta - bilateral femoral artery bypass graft 12/28/2011    Procedure: AORTA BIFEMORAL BYPASS GRAFT;  Surgeon: Nada Libman, MD;  Location: [facility] OR;  Service: Vascular;  Laterality: N/A;  AortoBifemoral Bypass using hemasheild 14x7 graft  . Femoral-popliteal bypass graft 12/28/2011    Procedure: BYPASS GRAFT FEMORAL-POPLITEAL ARTERY;  Surgeon: Nada Libman, MD;  Location: [facility] OR;  Service: Vascular;  Laterality: Right;  right femoral popliteal bypass using 6x80 propaten graft   Prior to Admission medications   Medication Sig Start Date End Date Taking? Authorizing Provider  albuterol (PROVENTIL HFA;VENTOLIN HFA) 108 (90 BASE) MCG/ACT inhaler Inhale 2 puffs into the lungs every 6 (six) hours as needed. Shortness of breath    Historical  Provider, MD  ALPRAZolam Prudy Feeler) 1 MG tablet Take 1 mg by mouth 4 (four) times daily as needed. For anxiety 08/23/11   Wilson Singer, MD  ARIPiprazole (ABILIFY) 2 MG tablet Take 2 mg by mouth daily.     Historical Provider, MD  butalbital-acetaminophen-caffeine (FIORICET, ESGIC) 50-325-40 MG per tablet Take 1 tablet by mouth 3 (three) times daily as needed. For headaches.    Historical Provider, MD  cinacalcet (SENSIPAR) 30 MG tablet Take 30 mg by mouth daily with supper.     Historical Provider, MD  clopidogrel (PLAVIX) 75 MG tablet Take 75 mg by mouth daily. Last dose 12/17/2011    Historical Provider, MD  Eyelid Cleansers (SYSTANE LID WIPES) PADS Place 1 application into the right eye daily.    Historical Provider, MD  insulin glargine (LANTUS) 100 UNIT/ML injection Inject 30 Units into the skin at bedtime.    Historical Provider, MD  insulin lispro (HUMALOG) 100 UNIT/ML injection Inject 7-15 Units into the skin 4 (four) times daily -  before meals and at bedtime. TAKING ON SLIDING SCALE 200-249=7 units 250-299=10 units 300-349=12 units >350=15 units    Historical Provider, MD  methocarbamol (ROBAXIN) 500 MG tablet Take 1,000 mg by mouth 4 (four) times daily as needed. For muscle spasms    Historical Provider, MD  metoCLOPramide (REGLAN) 10 MG tablet Take 20 mg by mouth 4 (four) times daily.    Historical Provider, MD  ofloxacin (OCUFLOX) 0.3 % ophthalmic solution Place 1 drop into the right eye 3 (three) times daily.     Historical Provider, MD  omeprazole (PRILOSEC) 40 MG capsule Take 40 mg by mouth daily.  11/12/11   Malissa Hippo, MD  oxyCODONE-acetaminophen (PERCOCET) 10-325 MG per tablet Take 1 tablet by mouth every 4 (four) hours. For pain 01/05/12   Marlowe Shores, PA  PARoxetine (PAXIL) 40 MG tablet Take 80 mg by mouth every morning.     Historical Provider, MD  promethazine (PHENERGAN) 25 MG tablet Take 25 mg by mouth every 6 (six) hours as needed. For nausea    Historical  Provider, MD  promethazine (PHENERGAN) 50 MG/ML injection Inject 50 mg into the muscle every 6 (six) hours as needed. For nausea    Historical Provider, MD  pyridoxine (B-6) 100 MG tablet Take 100 mg by mouth daily.    Historical Provider, MD  sevelamer (RENVELA) 800 MG tablet Take 1,600-4,000 mg by mouth 5 (five) times daily. *Take 5 tablets daily with each meal and take 2 tablets with snacks**    Historical Provider, MD  sucralfate (CARAFATE) 1 G tablet Take 1  g by mouth 2 (two) times daily before a meal.     Historical Provider, MD  temazepam (RESTORIL) 30 MG capsule Take 30 mg by mouth at bedtime.    Historical Provider, MD  traZODone (DESYREL) 100 MG tablet Take 300 mg by mouth at bedtime.     Historical Provider, MD   Allergies Allergies  Allergen Reactions  . Compazine (Prochlorperazine) Swelling    Per echart records  . Contrast Media (Iodinated Diagnostic Agents)     Per echart records  . Meperidine Hcl Swelling  . Metformin And Related Other (See Comments)    'just not myself, was told not to take metformin'  . Morphine Swelling  . Nubain (Nalbuphine Hcl) Swelling  . Topiramate (Topamax) Swelling    Tongue swelling - per echart records  . Toradol (Ketorolac Tromethamine) Swelling    Family History Family History  Problem Relation Age of Onset  . Transient ischemic attack Mother   . Hepatitis Sister   . Diabetes type II Other   . Anesthesia problems Neg Hx   . Hypotension Neg Hx   . Malignant hyperthermia Neg Hx   . Pseudochol deficiency Neg Hx    Social History  reports that she has been smoking Cigarettes.  She has a 40 pack-year smoking history. She has never used smokeless tobacco. She reports that she does not drink alcohol or use illicit drugs. 3 ppd Review Of Systems:  na  Brief patient description: lethargic wf.  Events Since Admission:   Current Status: Stable but lethargic Vital Signs: BP 121/63  Pulse 103  Resp 13  Ht 5' 10.5" (1.791 m)  SpO2  98%  LMP 09/03/2008   Physical Examination: General:  MAWF Neuro:  lethargic HEENT:  Lt i j tunneled cath Neck:  No jvd Cardiovascular:  hsr rrr Lungs:  Coarse rhonchi rt Abdomen:  +bs Musculoskeletal:  intact Skin:  Bilateral  Groin open wounds with green drainage Bilateral lower ext with open wounds   Active Problems:  * No active hospital problems. *    ASSESSMENT AND PLAN  PULMONARY No results found for this basename: PHART:5,PCO2:5,PCO2ART:5,PO2ART:5,HCO3:5,O2SAT:5 in the last 168 hours Ventilator Settings:   CXR:   No results found.  ETT:    A:  COPD/? pna P:   -bd's -abx -O2 as needed  CARDIOVASCULAR No results found for this basename: TROPONINI:5,LATICACIDVEN:5, O2SATVEN:5,PROBNP:5 in the last 168 hours ECG:   Lines: lt ij tunneled vas cath  A: CAD with hx of stents P:  -check cardiac enzymes  -check 12 lead  RENAL No results found for this basename: NA:5,K:2,CL:5,CO2:5,BUN:5,CREATININE:5,CALCIUM:5,MG:5,PHOS:5 in the last 168 hours Intake/Output    None    Foley:  6/20  A:  ESRD HD t,th,sat P:   -renal consult -will need to change hd cath. GASTROINTESTINAL No results found for this basename: AST:5,ALT:5,ALKPHOS:5,BILITOT:5,PROT:5,ALBUMIN:5 in the last 168 hours  A:  ppi P:     HEMATOLOGIC No results found for this basename: HGB:5,HCT:5,PLT:5,INR:5,APTT:5 in the last 168 hours A:ANEMIA OF CRITICAL ILLNESS P:  -CHECK CBC  INFECTIOUS No results found for this basename: WBC:5,PROCALCITON:5 in the last 168 hours Cultures: 6/19 BC X 2>> 6/19 UC>> 6/19 WOUND>> Antibiotics: 6/19 VANC>> 6/19 ZOYSN>>  A:  Presumed UTI, PNA, Wound infection P:   See flows  ENDOCRINE  Lab 02/14/12 1212  GLUCAP 184*   A:  DM   P:   -ssi  NEUROLOGIC  A:  Lethargic P:   -may need head ct in future  BEST  PRACTICE / DISPOSITION Level of Care:  sdu Primary Service:  pccm Consultants:  Renal 6/20 Code Status:  full Diet:  renal DVT Px:   heparin GI Px:  ppi Skin Integrity:  poor Social / Family:  Family updated  Steve Minor ACNP Adolph Pollack PCCM Pager [phone] till 3 pm If no answer page [phone] 02/14/2012, 12:22 PM  Will hold off anticoagulation for DVT until catheter issue is resolved, hold psych meds due to ams and will call renal for dialysis.  Will likely need a dialysis catheter placement after labs are checked for coags...etc.    CC time 45 min.  Patient seen and examined, agree with above note.  I dictated the care and orders written for this patient under my direction.  Koren Bound, M.D. [phone]

## 2012-02-14 NOTE — Consult Note (Signed)
Reason for Consult:ESRD Referring Physician: Dr. Hilda Lias is an 41 y.o. female.  HPI: 41yr old female with ESRD secondary to DM, hx PVD - S/P ABFG and then secondary dehisc from vol overload, chronic wounds at incisions, COPD with ongoing smoking, CAD, Anemia, HPTH, HTN, gastroparesis, Bolar, who last evening developed fever, lethargy so taken to [facility] ER and subsequently transferred here.   No N,V, D, cough, or worsened wound drainage.  Low flows of HD cath Tues and was to have new cath tomorrow. ROS  No HA but poor vision (DM) Stomach diffuse tender No CP, No rash or itching Passes little urine and no sx    Dialyzes at [facility] on TTS . Primary Nephrologist [place]. EDW unknown. HD [employer], Dialyzer unknown, Heparin unknown. Access [facility].  Past Medical History  Diagnosis Date  . DVT (deep venous thrombosis)     DVT HISTORY  . Diabetes mellitus   . Migraine     HISTORY  . Anxiety and depression   . CAD (coronary artery disease)     Last cath 2009:  LAD stent 80% stenosis. Circumflex 60-70% stenosis AV groove, right coronary artery 50-60% stenosis. She did have cutting balloon angioplasty of the LAD lesion into a diagonal. However, there was restenosis of this and it was managed medically.  . Bipolar 1 disorder   . Hyperlipidemia   . Hyperparathyroidism   . PE (pulmonary embolism)     HISTORY  . Dialysis patient   . Anemia   . Renal failure     dialysis [place] t,th sat  . Nephrotic syndrome   . Peripheral vascular disease   . MRSA infection   . Anxiety   . Depression   . Gastroparesis   . Complication of anesthesia   . PONV (postoperative nausea and vomiting)     x3 days post anesth. on 12/19/2011  . Myocardial infarction     x3 , last one - 10 yrs. ago  . Hypertension     sees Dr. Antoine Poche - [place], last stress test 11/2011, see result in EPIC, saw Dr. Antoine Poche as well at that time    . Shortness of breath   . Recurrent upper respiratory infection  (URI)     Bronchitis 11/2011 - saw Dr. Loney Hering, treating currently /w antibiotic   . Pneumonia     APH- 2012  . GERD (gastroesophageal reflux disease)     Past Surgical History  Procedure Date  . Incise and drain abcess     OF THIGHS FROM INSULIN INJECTIONS  . Heart stents   . Portacath placement 2003  . Cataract extraction w/phaco 11/09/2011    Procedure: CATARACT EXTRACTION PHACO AND INTRAOCULAR LENS PLACEMENT (IOC);  Surgeon: Edmon Crape, MD;  Location: [facility] OR;  Service: Ophthalmology;  Laterality: Right;  . Pars plana vitrectomy 11/09/2011    Procedure: PARS PLANA VITRECTOMY WITH 23 GAUGE;  Surgeon: Edmon Crape, MD;  Location: [facility] OR;  Service: Ophthalmology;  Laterality: Right;  Ahmed Valve; Scleral Reinforcement Graft  . Dg av dialysis  shunt access exist*l* or 12/13/11    left arm  . Dialysis cath inserted & portacath      dialysis catheter inserted & portacath d/c'd- 12/19/2011  . Back surgery X 4    x4 times   . Cholecystectomy   . Eye surgery   . Cardiac catheterization   . Aorta - bilateral femoral artery bypass graft 12/28/2011    Procedure: AORTA BIFEMORAL BYPASS GRAFT;  Surgeon: Nada Libman, MD;  Location: [facility] OR;  Service: Vascular;  Laterality: N/A;  AortoBifemoral Bypass using hemasheild 14x7 graft  . Femoral-popliteal bypass graft 12/28/2011    Procedure: BYPASS GRAFT FEMORAL-POPLITEAL ARTERY;  Surgeon: Nada Libman, MD;  Location: [facility] OR;  Service: Vascular;  Laterality: Right;  right femoral popliteal bypass using 6x80 propaten graft    Family History  Problem Relation Age of Onset  . Transient ischemic attack Mother   . Hepatitis Sister   . Diabetes type II Other   . Anesthesia problems Neg Hx   . Hypotension Neg Hx   . Malignant hyperthermia Neg Hx   . Pseudochol deficiency Neg Hx     Social History:  reports that she has been smoking Cigarettes.  She has a 40 pack-year smoking history. She has never used smokeless tobacco. She reports that she does not  drink alcohol or use illicit drugs.  Allergies:  Allergies  Allergen Reactions  . Compazine (Prochlorperazine) Swelling    Per echart records  . Contrast Media (Iodinated Diagnostic Agents)     Per echart records  . Meperidine Hcl Swelling  . Metformin And Related Other (See Comments)    'just not myself, was told not to take metformin'  . Morphine Swelling  . Nubain (Nalbuphine Hcl) Swelling  . Topiramate (Topamax) Swelling    Tongue swelling - per echart records  . Toradol (Ketorolac Tromethamine) Swelling    Medications:  I have reviewed the patient's current medications. Prior to Admission:  Prescriptions prior to admission  Medication Sig Dispense Refill  . albuterol (PROVENTIL HFA;VENTOLIN HFA) 108 (90 BASE) MCG/ACT inhaler Inhale 2 puffs into the lungs every 6 (six) hours as needed. Shortness of breath      . ALPRAZolam (XANAX) 1 MG tablet Take 1 mg by mouth 4 (four) times daily as needed. For anxiety      . ARIPiprazole (ABILIFY) 2 MG tablet Take 2 mg by mouth daily.       . butalbital-acetaminophen-caffeine (FIORICET, ESGIC) 50-325-40 MG per tablet Take 1 tablet by mouth 3 (three) times daily as needed. For headaches.      . cinacalcet (SENSIPAR) 30 MG tablet Take 30 mg by mouth daily with supper.       . clopidogrel (PLAVIX) 75 MG tablet Take 75 mg by mouth daily. Last dose 12/17/2011      . Eyelid Cleansers (SYSTANE LID WIPES) PADS Place 1 application into the right eye daily.      . insulin glargine (LANTUS) 100 UNIT/ML injection Inject 30 Units into the skin at bedtime.      . insulin lispro (HUMALOG) 100 UNIT/ML injection Inject 7-15 Units into the skin 4 (four) times daily -  before meals and at bedtime. TAKING ON SLIDING SCALE 200-249=7 units 250-299=10 units 300-349=12 units >350=15 units      . methocarbamol (ROBAXIN) 500 MG tablet Take 1,000 mg by mouth 4 (four) times daily as needed. For muscle spasms      . metoCLOPramide (REGLAN) 10 MG tablet Take 20 mg by  mouth 4 (four) times daily.      Marland Kitchen ofloxacin (OCUFLOX) 0.3 % ophthalmic solution Place 1 drop into the right eye 3 (three) times daily.       Marland Kitchen omeprazole (PRILOSEC) 40 MG capsule Take 40 mg by mouth daily.       Marland Kitchen oxyCODONE-acetaminophen (PERCOCET) 10-325 MG per tablet Take 1 tablet by mouth every 4 (four) hours. For pain      .  PARoxetine (PAXIL) 40 MG tablet Take 80 mg by mouth every morning.       . promethazine (PHENERGAN) 25 MG tablet Take 25 mg by mouth every 6 (six) hours as needed. For nausea      . promethazine (PHENERGAN) 50 MG/ML injection Inject 50 mg into the muscle every 6 (six) hours as needed. For nausea      . pyridoxine (B-6) 100 MG tablet Take 100 mg by mouth daily.      . sevelamer (RENVELA) 800 MG tablet Take 1,600-4,000 mg by mouth 5 (five) times daily. *Take 5 tablets daily with each meal and take 2 tablets with snacks**      . sucralfate (CARAFATE) 1 G tablet Take 1 g by mouth 2 (two) times daily before a meal.       . temazepam (RESTORIL) 30 MG capsule Take 30 mg by mouth at bedtime.      . traZODone (DESYREL) 100 MG tablet Take 300 mg by mouth at bedtime.        Zemplar unknown, Epogen unknown,    Results for orders placed during the hospital encounter of 02/14/12 (from the past 48 hour(s))  GLUCOSE, CAPILLARY     Status: Abnormal   Collection Time   02/14/12 12:12 PM      Component Value Range Comment   Glucose-Capillary 184 (*) 70 - 99 mg/dL   MRSA PCR SCREENING     Status: Normal   Collection Time   02/14/12 12:42 PM      Component Value Range Comment   MRSA by PCR NEGATIVE  NEGATIVE   URINALYSIS, ROUTINE W REFLEX MICROSCOPIC     Status: Abnormal   Collection Time   02/14/12  1:51 PM      Component Value Range Comment   Color, Urine YELLOW  YELLOW    APPearance CLOUDY (*) CLEAR    Specific Gravity, Urine 1.019  1.005 - 1.030    pH 5.5  5.0 - 8.0    Glucose, UA NEGATIVE  NEGATIVE mg/dL    Hgb urine dipstick MODERATE (*) NEGATIVE    Bilirubin Urine SMALL  (*) NEGATIVE    Ketones, ur NEGATIVE  NEGATIVE mg/dL    Protein, ur 540 (*) NEGATIVE mg/dL    Urobilinogen, UA 0.2  0.0 - 1.0 mg/dL    Nitrite NEGATIVE  NEGATIVE    Leukocytes, UA LARGE (*) NEGATIVE   URINE MICROSCOPIC-ADD ON     Status: Abnormal   Collection Time   02/14/12  1:51 PM      Component Value Range Comment   WBC, UA TOO NUMEROUS TO COUNT  <3 WBC/hpf    RBC / HPF 3-6  <3 RBC/hpf    Bacteria, UA FEW (*) RARE    Urine-Other FEW YEAST       Dg Chest Port 1 View  02/14/2012  *RADIOLOGY REPORT*  Clinical Data: Cough.  Sepsis.  PORTABLE CHEST - 1 VIEW  Comparison: 02/14/2012.  Findings: Low volume chest.  Basilar atelectasis is present.  Left IJ dialysis catheters present with the distal tip in the right atrium.  Right pleural effusion.  Cardiopericardial silhouette is enlarged. Patchy density is present at the left lung base, likely representing airspace disease.  Differential considerations include pneumonia or asymmetric/atypical pulmonary edema.  IMPRESSION: 1.  Low volume chest. 2. Small right pleural effusion and right greater than left basilar atelectasis. 3.  Mild cardiomegaly. 4.  Patchy airspace disease at the left base along the left heart border suspicious  for lingular pneumonia or edema.  Original Report Authenticated By: Andreas Newport, M.D.    @ROS @ Blood pressure 136/64, pulse 106, resp. rate 12, height 5' 10.5" (1.791 m), weight 81.647 kg (180 lb), last menstrual period 09/03/2008, SpO2 100.00%. @PHYSEXAMBYAGE2 @ Physical Examination: General appearance - Ox3, lethargic obese, pleasant, pale Mental status - drowsy Eyes - funduscopic exam abnormal DM retinopathy Mouth - edentulous and dry Neck - adenopathy noted PCL Lymphatics - posterior cervical nodes Chest - rales noted L>R, decreased air entry noted bilat, rhonchi noted bilat Heart - S1 and S2 normal, systolic murmur Gr2/6 at 2nd left intercostal space Abdomen - pos bs, but mild diffuse tender Extremities -  pedal edema 2 +, peripheral pulses abnormal 1+ DP, trophic changes bilat, open superficial wounds groins multiple old graft sites clean Skin - as above, pale,   Assessment/Plan: 1 Poss sepsis, most likely cath.  Consider groins, urine.  On BSAB 2 ESRD: will check chem, would like not to dialyze through infected cath if poss, and do after change.  Vol xs on exam 3 Hypertension: not an issue now or when vol controlled. 4. Anemia of ESRD: will eval level of Hb 5. Metabolic Bone Disease: check Phos 6 PVD with groin wounds 7 CAD 8 COPD with ongoing smoking,  9 BIPOLAR  P check chem and decide on HD, AB, EPO, Vit D  Deaven Urwin L 02/14/2012, 2:40 PM

## 2012-02-14 NOTE — Procedures (Signed)
I was present at this session.  I have reviewed the session itself and made appropriate changes. Low flows, may limit clearance and duration.  ??why nursing staff did not give heparin as ordered. Jahniah Pallas L 6/20/20136:58 PM

## 2012-02-14 NOTE — Progress Notes (Signed)
HD CATH PORTS SLUGGISH, WILL TPA FOR 3 HRS, DR. DETERDING AWARE THAT HD CATH SLUGGISH FROM BEGINNING OF TX, NEW HD CATH WILL BE PLACED TOMORROW.

## 2012-02-15 ENCOUNTER — Inpatient Hospital Stay (HOSPITAL_COMMUNITY): Payer: Medicare Other

## 2012-02-15 ENCOUNTER — Encounter (HOSPITAL_COMMUNITY)
Admission: AD | Disposition: A | Discharge status: Home / Self Care | Payer: Self-pay | Source: Other Acute Inpatient Hospital | Attending: Critical Care Medicine

## 2012-02-15 ENCOUNTER — Ambulatory Visit (HOSPITAL_COMMUNITY): Admission: RE | Admit: 2012-02-15 | Payer: Medicare Other | Source: Ambulatory Visit | Admitting: Vascular Surgery

## 2012-02-15 ENCOUNTER — Encounter (HOSPITAL_COMMUNITY): Payer: Self-pay | Admitting: Anesthesiology

## 2012-02-15 ENCOUNTER — Inpatient Hospital Stay (HOSPITAL_COMMUNITY): Payer: Medicare Other | Admitting: Anesthesiology

## 2012-02-15 DIAGNOSIS — E118 Type 2 diabetes mellitus with unspecified complications: Secondary | ICD-10-CM

## 2012-02-15 DIAGNOSIS — R4182 Altered mental status, unspecified: Secondary | ICD-10-CM

## 2012-02-15 DIAGNOSIS — I059 Rheumatic mitral valve disease, unspecified: Secondary | ICD-10-CM

## 2012-02-15 DIAGNOSIS — N186 End stage renal disease: Secondary | ICD-10-CM

## 2012-02-15 DIAGNOSIS — A419 Sepsis, unspecified organism: Secondary | ICD-10-CM

## 2012-02-15 HISTORY — PX: INSERTION OF DIALYSIS CATHETER: SHX1324

## 2012-02-15 LAB — CBC
MCH: 30.9 pg (ref 26.0–34.0)
MCHC: 32.1 g/dL (ref 30.0–36.0)
MCV: 96.2 fL (ref 78.0–100.0)
Platelets: 269 10*3/uL (ref 150–400)
RDW: 17.5 % — ABNORMAL HIGH (ref 11.5–15.5)
WBC: 15.6 10*3/uL — ABNORMAL HIGH (ref 4.0–10.5)

## 2012-02-15 LAB — URINE CULTURE
Colony Count: NO GROWTH
Culture  Setup Time: 201306201802

## 2012-02-15 LAB — GLUCOSE, CAPILLARY
Glucose-Capillary: 139 mg/dL — ABNORMAL HIGH (ref 70–99)
Glucose-Capillary: 186 mg/dL — ABNORMAL HIGH (ref 70–99)
Glucose-Capillary: 208 mg/dL — ABNORMAL HIGH (ref 70–99)
Glucose-Capillary: 227 mg/dL — ABNORMAL HIGH (ref 70–99)

## 2012-02-15 LAB — BASIC METABOLIC PANEL
Calcium: 9.7 mg/dL (ref 8.4–10.5)
Chloride: 96 mEq/L (ref 96–112)
Creatinine, Ser: 2.89 mg/dL — ABNORMAL HIGH (ref 0.50–1.10)
GFR calc Af Amer: 22 mL/min — ABNORMAL LOW (ref >90)
GFR calc non Af Amer: 19 mL/min — ABNORMAL LOW (ref >90)

## 2012-02-15 LAB — PROTIME-INR
INR: 1.3 (ref 0.00–1.49)
Prothrombin Time: 16.4 seconds — ABNORMAL HIGH (ref 11.6–15.2)

## 2012-02-15 LAB — CARDIAC PANEL(CRET KIN+CKTOT+MB+TROPI)
CK, MB: 2.4 ng/mL (ref 0.3–4.0)
Total CK: 31 U/L (ref 7–177)
Troponin I: <0.3 ng/mL (ref <0.30)

## 2012-02-15 LAB — MAGNESIUM: Magnesium: 2.3 mg/dL (ref 1.5–2.5)

## 2012-02-15 SURGERY — INSERTION OF DIALYSIS CATHETER
Anesthesia: Monitor Anesthesia Care | Laterality: Right | Wound class: Clean

## 2012-02-15 MED ORDER — SODIUM CHLORIDE 0.9 % IV SOLN
INTRAVENOUS | Status: DC | PRN
  Administered 2012-02-15: 13:00:00 via INTRAVENOUS
  Administered 2012-02-15: 35 mL

## 2012-02-15 MED ORDER — WHITE PETROLATUM GEL
Status: AC
  Administered 2012-02-15: 22:00:00
  Filled 2012-02-15: qty 5

## 2012-02-15 MED ORDER — HEPARIN SODIUM (PORCINE) 1000 UNIT/ML DIALYSIS: 20.0000 [IU]/kg | INTRAMUSCULAR | Status: DC | PRN

## 2012-02-15 MED ORDER — ONDANSETRON HCL 4 MG/2ML IJ SOLN: 4.0000 mg | Freq: Four times a day (QID) | INTRAMUSCULAR | Status: DC | PRN

## 2012-02-15 MED ORDER — MIDAZOLAM HCL 5 MG/5ML IJ SOLN
INTRAMUSCULAR | Status: DC | PRN
  Administered 2012-02-15 (×2): 1 mg via INTRAVENOUS

## 2012-02-15 MED ORDER — CEFUROXIME SODIUM 1.5 G IJ SOLR
1.5000 g | INTRAMUSCULAR | Status: DC
  Filled 2012-02-15: qty 1.5

## 2012-02-15 MED ORDER — ONDANSETRON HCL 4 MG/2ML IJ SOLN
4.0000 mg | Freq: Three times a day (TID) | INTRAMUSCULAR | Status: DC | PRN
  Administered 2012-02-16 – 2012-02-17 (×2): 4 mg via INTRAVENOUS
  Filled 2012-02-15 (×2): qty 2

## 2012-02-15 MED ORDER — SODIUM CHLORIDE 0.9 % IR SOLN
Status: DC | PRN
  Administered 2012-02-15: 14:00:00

## 2012-02-15 MED ORDER — INSULIN ASPART 100 UNIT/ML ~~LOC~~ SOLN
0.0000 [IU] | SUBCUTANEOUS | Status: DC
  Administered 2012-02-15 – 2012-02-16 (×4): 5 [IU] via SUBCUTANEOUS
  Administered 2012-02-17: 11 [IU] via SUBCUTANEOUS
  Administered 2012-02-17: 5 [IU] via SUBCUTANEOUS

## 2012-02-15 MED ORDER — SUCRALFATE 1 G PO TABS
1.0000 g | ORAL_TABLET | Freq: Two times a day (BID) | ORAL | Status: DC
  Administered 2012-02-16: 1 g via ORAL
  Filled 2012-02-15 (×4): qty 1

## 2012-02-15 MED ORDER — FENTANYL CITRATE 0.05 MG/ML IJ SOLN
INTRAMUSCULAR | Status: DC | PRN
  Administered 2012-02-15 (×4): 50 ug via INTRAVENOUS

## 2012-02-15 MED ORDER — PROPOFOL 10 MG/ML IV EMUL
INTRAVENOUS | Status: DC | PRN
  Administered 2012-02-15: 50 ug/kg/min via INTRAVENOUS

## 2012-02-15 MED ORDER — LIDOCAINE-EPINEPHRINE (PF) 1 %-1:200000 IJ SOLN
INTRAMUSCULAR | Status: AC
  Filled 2012-02-15: qty 10

## 2012-02-15 MED ORDER — CEFAZOLIN SODIUM 1-5 GM-% IV SOLN
INTRAVENOUS | Status: AC
  Filled 2012-02-15: qty 50

## 2012-02-15 MED ORDER — OXYCODONE-ACETAMINOPHEN 5-325 MG PO TABS
1.0000 | ORAL_TABLET | Freq: Four times a day (QID) | ORAL | Status: DC | PRN
  Administered 2012-02-16 – 2012-02-17 (×4): 1 via ORAL
  Filled 2012-02-15 (×5): qty 1

## 2012-02-15 MED ORDER — DARBEPOETIN ALFA-POLYSORBATE 60 MCG/0.3ML IJ SOLN
60.0000 ug | INTRAMUSCULAR | Status: DC
  Administered 2012-02-16: 60 ug via INTRAVENOUS
  Filled 2012-02-15: qty 0.3

## 2012-02-15 MED ORDER — MUPIROCIN 2 % EX OINT
TOPICAL_OINTMENT | Freq: Once | CUTANEOUS | Status: DC
  Filled 2012-02-15: qty 22

## 2012-02-15 MED ORDER — SODIUM CHLORIDE 0.9 % IV SOLN: INTRAVENOUS | Status: DC

## 2012-02-15 MED ORDER — PROPOFOL 10 MG/ML IV EMUL
INTRAVENOUS | Status: DC | PRN
  Administered 2012-02-15 (×2): 20 mg via INTRAVENOUS

## 2012-02-15 MED ORDER — 0.9 % SODIUM CHLORIDE (POUR BTL) OPTIME
TOPICAL | Status: DC | PRN
  Administered 2012-02-15: 1000 mL

## 2012-02-15 MED ORDER — VANCOMYCIN HCL 1000 MG IV SOLR
750.0000 mg | Freq: Once | INTRAVENOUS | Status: AC
  Administered 2012-02-15: 750 mg via INTRAVENOUS
  Filled 2012-02-15 (×2): qty 750

## 2012-02-15 MED ORDER — ARIPIPRAZOLE 2 MG PO TABS
2.0000 mg | ORAL_TABLET | Freq: Every day | ORAL | Status: DC
  Administered 2012-02-16 – 2012-02-17 (×2): 2 mg via ORAL
  Filled 2012-02-15 (×4): qty 1

## 2012-02-15 MED ORDER — HEPARIN SODIUM (PORCINE) 1000 UNIT/ML IJ SOLN
INTRAMUSCULAR | Status: DC | PRN
  Administered 2012-02-15: 4.6 mL

## 2012-02-15 MED ORDER — LIDOCAINE-EPINEPHRINE (PF) 1 %-1:200000 IJ SOLN
INTRAMUSCULAR | Status: DC | PRN
  Administered 2012-02-15: 21 mL

## 2012-02-15 MED ORDER — PAROXETINE HCL 30 MG PO TABS
80.0000 mg | ORAL_TABLET | ORAL | Status: DC
  Administered 2012-02-16 – 2012-02-17 (×2): 80 mg via ORAL
  Filled 2012-02-15 (×4): qty 1

## 2012-02-15 MED ORDER — METOCLOPRAMIDE HCL 10 MG PO TABS
20.0000 mg | ORAL_TABLET | Freq: Four times a day (QID) | ORAL | Status: DC
  Filled 2012-02-15 (×3): qty 2

## 2012-02-15 MED ORDER — HEPARIN SODIUM (PORCINE) 1000 UNIT/ML IJ SOLN
INTRAMUSCULAR | Status: AC
  Filled 2012-02-15: qty 1

## 2012-02-15 MED ORDER — CEFAZOLIN SODIUM 1-5 GM-% IV SOLN
INTRAVENOUS | Status: DC | PRN
  Administered 2012-02-15: 1 g via INTRAVENOUS

## 2012-02-15 MED ORDER — FENTANYL CITRATE 0.05 MG/ML IJ SOLN: 25.0000 ug | INTRAMUSCULAR | Status: DC | PRN

## 2012-02-15 MED ORDER — OFLOXACIN 0.3 % OP SOLN
1.0000 [drp] | Freq: Three times a day (TID) | OPHTHALMIC | Status: DC
  Administered 2012-02-15 – 2012-02-17 (×5): 1 [drp] via OPHTHALMIC
  Filled 2012-02-15: qty 5

## 2012-02-15 MED ORDER — INSULIN GLARGINE 100 UNIT/ML ~~LOC~~ SOLN
10.0000 [IU] | Freq: Every day | SUBCUTANEOUS | Status: DC
  Administered 2012-02-15 – 2012-02-16 (×2): 10 [IU] via SUBCUTANEOUS

## 2012-02-15 SURGICAL SUPPLY — 42 items
BAG DECANTER FOR FLEXI CONT (MISCELLANEOUS) ×4 IMPLANT
CATH CANNON HEMO 15F 50CM (CATHETERS) IMPLANT
CATH CANNON HEMO 15FR 19 (HEMODIALYSIS SUPPLIES) IMPLANT
CATH CANNON HEMO 15FR 23CM (HEMODIALYSIS SUPPLIES) ×4 IMPLANT
CATH CANNON HEMO 15FR 31CM (HEMODIALYSIS SUPPLIES) IMPLANT
CATH CANNON HEMO 15FR 32CM (HEMODIALYSIS SUPPLIES) IMPLANT
CHLORAPREP W/TINT 10.5 ML (MISCELLANEOUS) ×4 IMPLANT
CLOTH BEACON ORANGE TIMEOUT ST (SAFETY) ×4 IMPLANT
COVER PROBE W GEL 5X96 (DRAPES) ×4 IMPLANT
COVER SURGICAL LIGHT HANDLE (MISCELLANEOUS) ×4 IMPLANT
DRAPE C-ARM 42X72 X-RAY (DRAPES) ×4 IMPLANT
DRAPE CHEST BREAST 15X10 FENES (DRAPES) ×4 IMPLANT
GAUZE SPONGE 2X2 8PLY STRL LF (GAUZE/BANDAGES/DRESSINGS) ×6 IMPLANT
GAUZE SPONGE 4X4 16PLY XRAY LF (GAUZE/BANDAGES/DRESSINGS) ×4 IMPLANT
GLOVE BIO SURGEON STRL SZ7.5 (GLOVE) ×4 IMPLANT
GLOVE BIOGEL PI IND STRL 6.5 (GLOVE) ×6 IMPLANT
GLOVE BIOGEL PI IND STRL 7.5 (GLOVE) ×3 IMPLANT
GLOVE BIOGEL PI INDICATOR 6.5 (GLOVE) ×2
GLOVE BIOGEL PI INDICATOR 7.5 (GLOVE) ×1
GLOVE SURG SS PI 7.5 STRL IVOR (GLOVE) ×4 IMPLANT
GOWN STRL NON-REIN LRG LVL3 (GOWN DISPOSABLE) ×8 IMPLANT
KIT BASIN OR (CUSTOM PROCEDURE TRAY) ×4 IMPLANT
KIT ROOM TURNOVER OR (KITS) ×4 IMPLANT
NEEDLE 18GX1X1/2 (RX/OR ONLY) (NEEDLE) ×4 IMPLANT
NEEDLE 22X1 1/2 (OR ONLY) (NEEDLE) ×4 IMPLANT
NEEDLE HYPO 25GX1X1/2 BEV (NEEDLE) ×4 IMPLANT
NS IRRIG 1000ML POUR BTL (IV SOLUTION) ×4 IMPLANT
PACK SURGICAL SETUP 50X90 (CUSTOM PROCEDURE TRAY) ×4 IMPLANT
PAD ARMBOARD 7.5X6 YLW CONV (MISCELLANEOUS) ×8 IMPLANT
SOAP 2 % CHG 4 OZ (WOUND CARE) IMPLANT
SPONGE GAUZE 2X2 STER 10/PKG (GAUZE/BANDAGES/DRESSINGS) ×2
SUT ETHILON 3 0 PS 1 (SUTURE) ×4 IMPLANT
SUT VICRYL 4-0 PS2 18IN ABS (SUTURE) ×4 IMPLANT
SYR 20CC LL (SYRINGE) ×8 IMPLANT
SYR 30ML LL (SYRINGE) IMPLANT
SYR 5ML LL (SYRINGE) ×8 IMPLANT
SYR CONTROL 10ML LL (SYRINGE) ×4 IMPLANT
SYRINGE 10CC LL (SYRINGE) ×4 IMPLANT
TAPE CLOTH SURG 4X10 WHT LF (GAUZE/BANDAGES/DRESSINGS) ×4 IMPLANT
TOWEL OR 17X24 6PK STRL BLUE (TOWEL DISPOSABLE) ×4 IMPLANT
TOWEL OR 17X26 10 PK STRL BLUE (TOWEL DISPOSABLE) ×4 IMPLANT
WATER STERILE IRR 1000ML POUR (IV SOLUTION) ×4 IMPLANT

## 2012-02-15 NOTE — Anesthesia Postprocedure Evaluation (Signed)
  Anesthesia Post-op Note  Patient: [name]  Procedure(s) Performed: Procedure(s) (LRB): INSERTION OF DIALYSIS CATHETER (Right) REMOVAL OF A DIALYSIS CATHETER (Left)  Patient Location: PACU  Anesthesia Type: General  Level of Consciousness: awake, alert  and oriented  Airway and Oxygen Therapy: Patient Spontanous Breathing  Post-op Pain: mild  Post-op Assessment: Post-op Vital signs reviewed  Post-op Vital Signs: Reviewed  Complications: No apparent anesthesia complications

## 2012-02-15 NOTE — Op Note (Signed)
02/15/2012  PREOP DIAGNOSIS: Chronic kidney disease  POSTOP DIAGNOSIS: Chronic kidney disease  PROCEDURE: Ultrasound guided placement of Right  IJ Diatek catheter Removal of left IJ Diatek cath  SURGEON: Di Kindle. Edilia Bo, MD, FACS  ASSIST: none  ANESTHESIA: local with sedation   EBL: minimal  FINDINGS: patent right IJ  INDICATIONS: nonfunctioning left IJ catheter  TECHNIQUE: The patient was taken to the operating room and sedated by anesthesia. The neck and upper chest were prepped and draped in the usual sterile fashion. After the skin was anesthetized with 1% lidocaine, and under ultrasound guidance, the right IJ was cannulated and a guidewire introduced into the superior vena cava under fluoroscopic control. The tract over the wire was dilated and then the dilator and peel-away sheath were passed over the wire and the wire and dilator removed. The catheter was passed through the peel-away sheath and positioned in the right atrium. The exit site for the catheter was selected and the skin anesthetized between the 2 areas. The catheter was then brought through the tunnel, cut to the appropriate length, and the distal ports were attached. Both ports withdrew easily, were then flushed with heparinized saline and filled with concentrated heparin. The catheter was secured at its exit site with a 3-0 nylon suture. The IJ cannulation site was closed with a 4-0 subcuticular stitch. A sterile dressing was applied.  Next the skin was anesthetized at the site of the left IJ catheter. Using traction the catheter was removed. The cuff was intact. The distal tip was sent for culture. Pressure was held for hemostasis. Fluoroscopy confirmed this did not change the position of the right IJ catheter. The patient tolerated the procedure well and was transferred to the recovery room in stable condition. All needle and sponge counts were correct.  Waverly Ferrari, MD, FACS [facility]  DATE OF OPERATION: 02/15/2012 DATE OF DICTATION: 02/15/2012

## 2012-02-15 NOTE — Interval H&P Note (Signed)
History and Physical Interval Note:  02/15/2012 1:11 PM  [name]  has presented today for surgery, with the diagnosis of ESRDL;DIFFICULTY WITH CATHETER  The various methods of treatment have been discussed with the patient and family. After consideration of risks, benefits and other options for treatment, the patient has consented to: EXCHANGE OF A DIALYSIS CATHETER (N/A) as a surgical intervention .  The patient's history has been reviewed, patient examined, no change in status, stable for surgery.  I have reviewed the patients' chart and labs.  Questions were answered to the patient's satisfaction.     [name] S

## 2012-02-15 NOTE — Anesthesia Preprocedure Evaluation (Addendum)
Anesthesia Evaluation  Patient identified by MRN, date of birth, ID band Patient awake    Reviewed: Allergy & Precautions, H&P , NPO status , Patient's Chart, lab work & pertinent test results  History of Anesthesia Complications (+) PONV  Airway Mallampati: II  Neck ROM: full    Dental  (+) Edentulous Upper   Pulmonary shortness of breath, Recent URI , Current Smoker,          Cardiovascular hypertension, Pt. on medications + CAD, + Past MI and + Cardiac Stents     Neuro/Psych  Headaches, PSYCHIATRIC DISORDERS Anxiety Depression    GI/Hepatic GERD-  Medicated and Controlled,  Endo/Other  Diabetes mellitus-, Type 2, Oral Hypoglycemic Agents  Renal/GU      Musculoskeletal   Abdominal   Peds  Hematology   Anesthesia Other Findings   Reproductive/Obstetrics                         Anesthesia Physical Anesthesia Plan  ASA: III  Anesthesia Plan: MAC   Post-op Pain Management:    Induction: Intravenous  Airway Management Planned: Simple Face Mask  Additional Equipment:   Intra-op Plan:   Post-operative Plan:   Informed Consent: I have reviewed the patients History and Physical, chart, labs and discussed the procedure including the risks, benefits and alternatives for the proposed anesthesia with the patient or authorized representative who has indicated his/her understanding and acceptance.     Plan Discussed with: CRNA and Surgeon  Anesthesia Plan Comments:         Anesthesia Quick Evaluation

## 2012-02-15 NOTE — Transfer of Care (Signed)
Immediate Anesthesia Transfer of Care Note  Patient: [name]  Procedure(s) Performed: Procedure(s) (LRB): INSERTION OF DIALYSIS CATHETER (Right) REMOVAL OF A DIALYSIS CATHETER (Left)  Patient Location: PACU  Anesthesia Type: MAC  Level of Consciousness: awake, alert  and oriented  Airway & Oxygen Therapy: Patient Spontanous Breathing  Post-op Assessment: Report given to PACU RN and Post -op Vital signs reviewed and stable  Post vital signs: Reviewed and stable  Complications: No apparent anesthesia complications

## 2012-02-15 NOTE — Progress Notes (Signed)
ANTIBIOTIC CONSULT NOTE - FOLLOW UP  Pharmacy Consult for Vancomycin/Zosyn Indication: UTI, PNA, B groin wound infection, cellulitis LE's  Allergies  Allergen Reactions  . Compazine (Prochlorperazine) Swelling    Per echart records  . Contrast Media (Iodinated Diagnostic Agents)     Per echart records  . Meperidine Hcl Swelling  . Metformin And Related Other (See Comments)    'just not myself, was told not to take metformin'  . Morphine Swelling  . Nubain (Nalbuphine Hcl) Swelling  . Topiramate (Topamax) Swelling    Tongue swelling - per echart records  . Toradol (Ketorolac Tromethamine) Swelling    Patient Measurements: Height: 5\' 10"  (177.8 cm) Weight: 202 lb 13.2 oz (92 kg) IBW/kg (Calculated) : 68.5   Vital Signs: Temp: 97.9 F (36.6 C) (06/21 1230) Temp src: Oral (06/21 0800) BP: 154/75 mmHg (06/21 1230) Pulse Rate: 110  (06/21 1230) Intake/Output from previous day: 06/20 0701 - 06/21 0700 In: 455 [I.V.:145; IV Piggyback:310] Out: -95 [Urine:80] Intake/Output from this shift: Total I/O In: 30 [I.V.:30] Out: 32 [Urine:30; Stool:2]  Labs:  Basename 02/15/12 0645 02/15/12 0345 02/14/12 1447  WBC -- 15.6* 21.1*  HGB -- 9.8* 9.7*  PLT -- 269 252  LABCREA -- -- --  CREATININE 2.89* -- 5.79*   Estimated Creatinine Clearance: 31.2 ml/min (by C-G formula based on Cr of 2.89). No results found for this basename: VANCOTROUGH:2,VANCOPEAK:2,VANCORANDOM:2,GENTTROUGH:2,GENTPEAK:2,GENTRANDOM:2,TOBRATROUGH:2,TOBRAPEAK:2,TOBRARND:2,AMIKACINPEAK:2,AMIKACINTROU:2,AMIKACIN:2, in the last 72 hours   Microbiology: Recent Results (from the past 720 hour(s))  MRSA PCR SCREENING     Status: Normal   Collection Time   02/14/12 12:42 PM      Component Value Range Status Comment   MRSA by PCR NEGATIVE  NEGATIVE Final   URINE CULTURE     Status: Normal   Collection Time   02/14/12  1:20 PM      Component Value Range Status Comment   Specimen Description URINE, CATHETERIZED    Final    Special Requests NONE   Final    Culture  Setup Time [id number]   Final    Colony Count NO GROWTH   Final    Culture NO GROWTH   Final    Report Status 02/15/2012 FINAL   Final   WOUND CULTURE     Status: Normal (Preliminary result)   Collection Time   02/14/12  1:20 PM      Component Value Range Status Comment   Specimen Description WOUND GROIN LEFT   Final    Special Requests NONE   Final    Gram Stain     Final    Value: NO WBC SEEN     NO SQUAMOUS EPITHELIAL CELLS SEEN     NO ORGANISMS SEEN   Culture NO GROWTH   Final    Report Status PENDING   Incomplete   WOUND CULTURE     Status: Normal (Preliminary result)   Collection Time   02/14/12  1:20 PM      Component Value Range Status Comment   Specimen Description WOUND GROIN RIGHT   Final    Special Requests NONE   Final    Gram Stain     Final    Value: NO WBC SEEN     NO SQUAMOUS EPITHELIAL CELLS SEEN     NO ORGANISMS SEEN   Culture NO GROWTH   Final    Report Status PENDING   Incomplete   CULTURE, BLOOD (ROUTINE X 2)     Status: Normal (Preliminary result)  Collection Time   02/14/12  2:08 PM      Component Value Range Status Comment   Specimen Description BLOOD RIGHT FOREARM   Final    Special Requests BOTTLES DRAWN AEROBIC ONLY 5CC   Final    Culture  Setup Time [id number]   Final    Culture     Final    Value:        BLOOD CULTURE RECEIVED NO GROWTH TO DATE CULTURE WILL BE HELD FOR 5 DAYS BEFORE ISSUING A FINAL NEGATIVE REPORT   Report Status PENDING   Incomplete   CULTURE, BLOOD (ROUTINE X 2)     Status: Normal (Preliminary result)   Collection Time   02/14/12  2:18 PM      Component Value Range Status Comment   Specimen Description BLOOD LEFT UPPER ARM   Final    Special Requests BOTTLES DRAWN AEROBIC ONLY 3CC   Final    Culture  Setup Time [id number]   Final    Culture     Final    Value:        BLOOD CULTURE RECEIVED NO GROWTH TO DATE CULTURE WILL BE HELD FOR 5 DAYS BEFORE ISSUING A FINAL  NEGATIVE REPORT   Report Status PENDING   Incomplete     Anti-infectives     Start     Dose/Rate Route Frequency Ordered Stop   02/15/12 1400   vancomycin (VANCOCIN) 750 mg in sodium chloride 0.9 % 150 mL IVPB        750 mg 150 mL/hr over 60 Minutes Intravenous  Once 02/15/12 1148     02/15/12 0656   cefUROXime (ZINACEF) 1.5 g in dextrose 5 % 50 mL IVPB  Status:  Discontinued        1.5 g 100 mL/hr over 30 Minutes Intravenous 30 min pre-op 02/15/12 0656 02/15/12 1211   02/14/12 1500   vancomycin (VANCOCIN) 750 mg in sodium chloride 0.9 % 150 mL IVPB        750 mg 150 mL/hr over 60 Minutes Intravenous  Once 02/14/12 1354 02/14/12 1541   02/14/12 1500  piperacillin-tazobactam (ZOSYN) IVPB 2.25 g       2.25 g 100 mL/hr over 30 Minutes Intravenous 3 times per day 02/14/12 1354     02/14/12 1315   piperacillin-tazobactam (ZOSYN) IVPB 3.375 g  Status:  Discontinued        3.375 g 12.5 mL/hr over 240 Minutes Intravenous 4 times per day 02/14/12 1305 02/14/12 1351   02/14/12 1315   vancomycin (VANCOCIN) IVPB 1000 mg/200 mL premix  Status:  Discontinued        1,000 mg 200 mL/hr over 60 Minutes Intravenous Every 12 hours 02/14/12 1305 02/14/12 1351          Assessment: 41 yo F transferred from [facility], with ESRD.  On day two vancomycin and Zosyn for empiric coverage of UTI, PNA, B groin wound infection, and cellulitis of LE's.  Patient received HD overnight, therefore will give additional vancomycin dose now.  Since only received 3 hours with BFR 340, will give 750 mg (instead of 1000 mg IV for weight).  Cultures have not revealed anything yet.  Goal of Therapy:  Pre HD vancomycin level 15-25 mcg/mL  Plan:  1.  Vancomycin 750 mg IV x 1 today 2.  F/up HD plans for further vancomycind osing 3.  Continue Zosyn 3.375 gm IV q 8hr, each dose over 4 hours 4.  Follow-up cultures, vancomycin levels as  needed  Rolland Porter, Pharm.D., BCPS Clinical Pharmacist Pager:  [phone] 02/15/2012,1:55 PM

## 2012-02-15 NOTE — Progress Notes (Signed)
Subjective:  Received HD overnight after TPA to cath, tolerated well. UF not documented but goal was 4 liters.  Alert this AM, no complaints  Objective Vital signs in last 24 hours: Filed Vitals:   02/15/12 0600 02/15/12 0700 02/15/12 0800 02/15/12 0839  BP: 141/68 139/62 141/54   Pulse: 120 117 111   Temp:   98.2 F (36.8 C)   TempSrc:   Oral   Resp: 11 15 20    Height:      Weight:      SpO2: 96% 94% 94% 95%   Weight change:   Intake/Output Summary (Last 24 hours) at 02/15/12 0859 Last data filed at 02/15/12 0800  Gross per 24 hour  Intake    465 ml  Output    -64 ml  Net    529 ml   Labs: Basic Metabolic Panel:  Lab 02/15/12 1610 02/14/12 1447  NA 136 128*  K 3.5 4.7  CL 96 91*  CO2 23 17*  GLUCOSE 175* 170*  BUN 24* 61*  CREATININE 2.89* 5.79*  CALCIUM 9.7 10.1  ALB -- --  PHOS 4.1 6.4*   Liver Function Tests:  Lab 02/14/12 1447  AST 13  ALT 12  ALKPHOS 195*  BILITOT 0.2*  PROT 6.4  ALBUMIN 2.1*   No results found for this basename: LIPASE:3,AMYLASE:3 in the last 168 hours No results found for this basename: AMMONIA:3 in the last 168 hours CBC:  Lab 02/15/12 0345 02/14/12 1447  WBC 15.6* 21.1*  NEUTROABS -- 19.0*  HGB 9.8* 9.7*  HCT 30.5* 31.0*  MCV 96.2 98.4  PLT 269 252   Cardiac Enzymes:  Lab 02/15/12 0052 02/14/12 1905 02/14/12 1447  CKTOTAL 31 36 48  CKMB 2.4 2.5 2.2  CKMBINDEX -- -- --  TROPONINI <0.30 <0.30 <0.30   CBG:  Lab 02/15/12 0739 02/15/12 0408 02/15/12 0011 02/14/12 2002 02/14/12 1552  GLUCAP 186* 139* 227* 194* 157*    Iron Studies: No results found for this basename: IRON,TIBC,TRANSFERRIN,FERRITIN in the last 72 hours Studies/Results: Dg Chest Port 1 View  02/15/2012  *RADIOLOGY REPORT*  Clinical Data: Shortness of breath.  PORTABLE CHEST - 1 VIEW  Comparison: Portable chest is 02/14/2012.  Findings: A left IJ dialysis catheter is in place.  The heart size is exaggerated by low lung volumes.  There is some residual  blunting of the right costophrenic angle, improved from the prior exam.  IMPRESSION:  1.  Slight improved aeration at the lung bases bilaterally. 2.  Persistent low lung volumes. 3.  Persistent blunting of the right costophrenic angle.  Original Report Authenticated By: Jamesetta Orleans. MATTERN, M.D.   Dg Chest Port 1 View  02/14/2012  *RADIOLOGY REPORT*  Clinical Data: Cough.  Sepsis.  PORTABLE CHEST - 1 VIEW  Comparison: 02/14/2012.  Findings: Low volume chest.  Basilar atelectasis is present.  Left IJ dialysis catheters present with the distal tip in the right atrium.  Right pleural effusion.  Cardiopericardial silhouette is enlarged. Patchy density is present at the left lung base, likely representing airspace disease.  Differential considerations include pneumonia or asymmetric/atypical pulmonary edema.  IMPRESSION: 1.  Low volume chest. 2. Small right pleural effusion and right greater than left basilar atelectasis. 3.  Mild cardiomegaly. 4.  Patchy airspace disease at the left base along the left heart border suspicious for lingular pneumonia or edema.  Original Report Authenticated By: Andreas Newport, M.D.   Medications: Infusions:    . sodium chloride 10 mL/hr (02/14/12 1433)  .  sodium chloride      Scheduled Medications:    . ipratropium  0.5 mg Nebulization Q4H   And  . albuterol  2.5 mg Nebulization Q4H  . calcium acetate  1,334 mg Oral TID WC  . cefUROXime (ZINACEF)  IV  1.5 g Intravenous 30 min Pre-Op  . heparin  40 Units/kg Dialysis Once in dialysis  . heparin subcutaneous  5,000 Units Subcutaneous Q8H  . multivitamin  1 tablet Oral Daily  . mupirocin ointment   Nasal Once  . pantoprazole (PROTONIX) IV  40 mg Intravenous QHS  . piperacillin-tazobactam (ZOSYN)  IV  2.25 g Intravenous Q8H  . vancomycin  750 mg Intravenous Once  . DISCONTD: piperacillin-tazobactam (ZOSYN)  IV  3.375 g Intravenous Q6H  . DISCONTD: vancomycin  1,000 mg Intravenous Q12H    have reviewed  scheduled and prn medications.  Physical Exam: General:  Alert, no complaints.  Getting stuck for a PT/INR for surgery (cath exchange) Heart: tachy Lungs: mostly clear Abdomen: soft, non tender Extremities: does have edema Dialysis Access:left sided PC , for exchange today.   I Assessment/ Plan: Pt is a 41 y.o. yo female with ESRD who was admitted on 02/14/2012 with  Fever, elevated WBC and poorly functioning PC  Assessment/Plan: 1. Fever, increased WBC- WBC has improved. On zosyn and vanc, cultures negative to date.  For PC exchange today (possible source of issues) 2. ESRD - normally TTS at [facility].  Will plan for HD tomorrow 3. Anemia- hgb 9.8. Check iron stores and start aranesp 4. Secondary hyperparathyroidism- renavite, phoslo.  Will follow phos, calcium 5. HTN/volume- overall still seems overloaded.  Will UF as able with HD. 6. PVD- with continued smoking 7. Decreased MS- not sure of baseline, is conversive today.   Onyx Schirmer A   02/15/2012,8:59 AM  LOS: 1 day

## 2012-02-15 NOTE — H&P (Signed)
Name: [name] MRN: [id number] DOB: 1971-08-12    LOS: 1 Referring Provider:  [name]  Reason for Referral:  AMS, fever.  PULMONARY / CRITICAL CARE MEDICINE  HPI:  Ms [name] is a 41 yo female with a plethora of health problems. 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 She had aorta bi fem per [facility] 12/28/11 and has bilateral groin open wounds with green malodorous drainage. Further bilateral lower ext wounds are noted . She has hemodialysis Tuesday 6/18 and was in her usual poor state of health. 6-19 had low grade fever and on 6-19  fever 102.5. Taken to [facility] and tx with Vancomycin and Zosyn. She was hypertensive but met sirs criteria and was tx to [facility]. Note she is bipolar and smoke 3 PPD. PCCM asked to admit. Of note she was noted to have dirty urine and lung infiltrate at [name]. Her Lt vas cath is scheduled to be changed per Dr. Durwin [name] on 6/21.  Events Since Admission: 6/20 hemodynamically stable, lower WC   Current Status: Sleepy this am, afebrile Vital Signs: BP 141/51  Pulse 110  Temp 98.2 F (36.8 C) (Oral)  Resp 6  Ht 5\' 10"  (1.778 m)  Wt 92 kg (202 lb 13.2 oz)  BMI 29.10 kg/m2  SpO2 97%  LMP 09/03/2008   Physical Examination: General:  [facility] Neuro:  Sleepy, but oriented x 3 & non focal HEENT:  Lt i j tunneled cath Neck:  No jvd Cardiovascular:  hsr rrr Lungs:  Coarse rhonchi rt Abdomen:  +bs Musculoskeletal:  intact Skin:  Bilateral  Groin open wounds with green drainage Bilateral lower ext with open wounds   Active Problems:  Sepsis  Altered mental status  Hypoxemia  HTN (hypertension)  Diabetes mellitus   ASSESSMENT AND PLAN  PULMONARY  Lab 02/14/12 1706 02/14/12 1650  PHART 7.321* 7.255*  PCO2ART 34.4* 45.6*  PO2ART 56.0* 25.0*  HCO3 17.9* 20.2  O2SAT 88.0 35.0   Ventilator Settings:   CXR:   Dg Chest Port 1 View  02/15/2012  *RADIOLOGY REPORT*  Clinical  Data: Shortness of breath.  PORTABLE CHEST - 1 VIEW  Comparison: Portable chest is 02/14/2012.  Findings: A left IJ dialysis catheter is in place.  The heart size is exaggerated by low lung volumes.  There is some residual blunting of the right costophrenic angle, improved from the prior exam.  IMPRESSION:  1.  Slight improved aeration at the lung bases bilaterally. 2.  Persistent low lung volumes. 3.  Persistent blunting of the right costophrenic angle.  Original Report Authenticated By: [name]. [name], M.D.   Dg Chest Port 1 View  02/14/2012  *RADIOLOGY REPORT*  Clinical Data: Cough.  Sepsis.  PORTABLE CHEST - 1 VIEW  Comparison: 02/14/2012.  Findings: Low volume chest.  Basilar atelectasis is present.  Left IJ dialysis catheters present with the distal tip in the right atrium.  Right pleural effusion.  Cardiopericardial silhouette is enlarged. Patchy density is present at the left lung base, likely representing airspace disease.  Differential considerations include pneumonia or asymmetric/atypical pulmonary edema.  IMPRESSION: 1.  Low volume chest. 2. Small right pleural effusion and right greater than left basilar atelectasis. 3.  Mild cardiomegaly. 4.  Patchy airspace disease at the left base along the left heart border suspicious for lingular pneumonia or edema.  Original Report Authenticated By: [name], M.D.    ETT:  A:  COPD/? pna P:   -bd's -abx -O2 as needed -smoking cessation -No e/o pna on cxr  CARDIOVASCULAR  Lab 02/15/12 0052 02/14/12 1905 02/14/12 1449 02/14/12 1447  TROPONINI <0.30 <0.30 -- <0.30  LATICACIDVEN -- -- 1.0 --  PROBNP -- -- -- 13271.0*   ECG:   Lines: lt ij tunneled vas cath  A: CAD with hx of stents P:  -cardiac enzymes neg -resume plavix once permacath placed   RENAL  Lab 02/15/12 0645 02/14/12 1447  NA 136 128*  K 3.5 4.7  CL 96 91*  CO2 23 17*  [name] 24* 61*  CREATININE 2.89* 5.79*  CALCIUM 9.7 10.1  MG 2.3 2.9*  PHOS 4.1  6.4*   Intake/Output      06/20 0701 - 06/21 0700 06/21 0701 - 06/22 0700   I.V. (mL/kg) 145 (1.6) 20 (0.2)   IV Piggyback 310    Total Intake(mL/kg) 455 (4.9) 20 (0.2)   Urine (mL/kg/hr) 80 (0) 30   Other -175    Stool  1   Total Output -95 31   Net +550 -11         Foley:  6/20  A:  ESRD HD t,th,sat P:   -renal consult - change of permacath planned ([facility])  GASTROINTESTINAL  Lab 02/14/12 1447  AST 13  ALT 12  ALKPHOS 195*  BILITOT 0.2*  PROT 6.4  ALBUMIN 2.1*    A:  ppi P:     HEMATOLOGIC  Lab 02/15/12 0345 02/14/12 1447  HGB 9.8* 9.7*  HCT 30.5* 31.0*  PLT 269 252  INR -- 1.18  APTT -- 36   A:ANEMIA OF CRITICAL ILLNESS P:  -CHECK CBC  INFECTIOUS  Lab 02/15/12 0345 02/14/12 1447 02/14/12 1446  WBC 15.6* 21.1* --  PROCALCITON -- -- 2.96   Cultures: 6/19 BC X 2>>ng 6/19 UC>> 6/19 WOUND>>ng Antibiotics: 6/19 VANC>> 6/19 ZOYSN>>  A:  Presumed UTI, PNA, Wound infection P:   Simplify abx per cx data  ENDOCRINE  Lab 02/15/12 0739 02/15/12 0408 02/15/12 0011 02/14/12 2002 02/14/12 1552  GLUCAP 186* 139* 227* 194* 157*   A:  DM   P:   -ssi -moderate scale -lantus 10 units while npo (uses 30 at home)  NEUROLOGIC  A:  Lethargic P:   -doubt need for head ct   BEST PRACTICE / DISPOSITION Level of Care:  sdu -OK to transfer to renal floor Primary Service:  pccm Consultants:  Renal 6/20 Code Status:  full Diet:  renal DVT Px:  heparin GI Px:  ppi Skin Integrity:  poor Social / Family:  Family updated   02/15/2012, 10:02 AM  [name] V.

## 2012-02-15 NOTE — Preoperative (Signed)
Beta Blockers   Reason not to administer Beta Blockers:Not Applicable 

## 2012-02-15 NOTE — Progress Notes (Signed)
Pt arrived to unit. Pt alert and oriented. C/O nausea and abdominal discomfort. Pt has been oriented to room. Bed in low position. Instructed to call before getting out of bed. Will continue to monitor pt.

## 2012-02-15 NOTE — H&P (View-Only) (Signed)
The patient is status post aortobifemoral bypass graft and right femoral-popliteal bypass graft done for limb salvage for 2 ulcers on her right leg. She was readmitted to the hospital with severe swelling as well as breakdown of her groin incisions. She comes back today for followup. Her groin incisions are asked the granulating in nicely. She still has a fair amount of fluid on both legs. She is not done a good job of controlling her fluid intake. 15 L of fluid were taken off on dialysis over the weekend.  I'm going to place a wound VAC on both groins. I am also point to place a Profore on the right leg to facilitate healing of her ulcers. I feel that the edema is inhibiting healing of her wounds. [name] see her back in 3 weeks

## 2012-02-15 NOTE — Progress Notes (Signed)
INITIAL ADULT NUTRITION ASSESSMENT Date: 02/15/2012   Time: 11:32 AM  Reason for Assessment: Low Braden; wounds  ASSESSMENT: Female 41 y.o.  Dx: AMS, fever  Hx:  Past Medical History  Diagnosis Date  . DVT (deep venous thrombosis)     DVT HISTORY  . Diabetes mellitus   . Migraine     HISTORY  . Anxiety and depression   . CAD (coronary artery disease)     Last cath 2009:  LAD stent 80% stenosis. Circumflex 60-70% stenosis AV groove, right coronary artery 50-60% stenosis. She did have cutting balloon angioplasty of the LAD lesion into a diagonal. However, there was restenosis of this and it was managed medically.  . Bipolar 1 disorder   . Hyperlipidemia   . Hyperparathyroidism   . PE (pulmonary embolism)     HISTORY  . Dialysis patient   . Anemia   . Renal failure     dialysis [place] t,th sat  . Nephrotic syndrome   . Peripheral vascular disease   . MRSA infection   . Anxiety   . Depression   . Gastroparesis   . Complication of anesthesia   . PONV (postoperative nausea and vomiting)     x3 days post anesth. on 12/19/2011  . Myocardial infarction     x3 , last one - 10 yrs. ago  . Hypertension     sees Dr. Antoine Poche - [place], last stress test 11/2011, see result in EPIC, saw Dr. Antoine Poche as well at that time    . Shortness of breath   . Recurrent upper respiratory infection (URI)     Bronchitis 11/2011 - saw Dr. Loney Hering, treating currently /w antibiotic   . Pneumonia     [facility]- 2012  . GERD (gastroesophageal reflux disease)     Related Meds:  Scheduled Meds:   . ipratropium  0.5 mg Nebulization Q4H   And  . albuterol  2.5 mg Nebulization Q4H  . calcium acetate  1,334 mg Oral TID WC  . cefUROXime (ZINACEF)  IV  1.5 g Intravenous 30 min Pre-Op  . darbepoetin (ARANESP) injection - DIALYSIS  60 mcg Intravenous Q Sat-HD  . heparin  40 Units/kg Dialysis Once in dialysis  . heparin subcutaneous  5,000 Units Subcutaneous Q8H  . insulin aspart  0-15 Units Subcutaneous Q4H  .  insulin glargine  10 Units Subcutaneous QHS  . multivitamin  1 tablet Oral Daily  . mupirocin ointment   Nasal Once  . pantoprazole (PROTONIX) IV  40 mg Intravenous QHS  . piperacillin-tazobactam (ZOSYN)  IV  2.25 g Intravenous Q8H  . vancomycin  750 mg Intravenous Once  . DISCONTD: piperacillin-tazobactam (ZOSYN)  IV  3.375 g Intravenous Q6H  . DISCONTD: vancomycin  1,000 mg Intravenous Q12H   Continuous Infusions:   . sodium chloride 10 mL/hr (02/14/12 1433)  . sodium chloride     PRN Meds:.sodium chloride, sodium chloride, acetaminophen, alteplase, feeding supplement (NEPRO CARB STEADY), heparin, heparin, lidocaine, lidocaine-prilocaine, pentafluoroprop-tetrafluoroeth, DISCONTD: insulin aspart, DISCONTD: pantoprazole (PROTONIX) IV   Ht: 5\' 10"  (177.8 cm)  Wt: 202 lb 13.2 oz (92 kg)  Ideal Wt: 68.2 kg % Ideal Wt: 135%  Wt Readings from Last 15 Encounters:  02/14/12 202 lb 13.2 oz (92 kg)  02/14/12 202 lb 13.2 oz (92 kg)  02/04/12 180 lb (81.647 kg)  01/22/12 182 lb 1.6 oz (82.6 kg)  01/05/12 181 lb 7 oz (82.3 kg)  01/05/12 181 lb 7 oz (82.3 kg)  12/24/11 170 lb (77.111  kg)  12/20/11 170 lb (77.111 kg)  12/17/11 170 lb (77.111 kg)  12/04/11 185 lb (83.915 kg)  11/26/11 180 lb (81.647 kg)  11/06/11 185 lb (83.915 kg)  11/06/11 185 lb (83.915 kg)  11/03/11 184 lb 11.2 oz (83.779 kg)  10/29/11 180 lb (81.647 kg)    Usual Wt: 180 lb % Usual Wt: 112%  Body mass index is 29.10 kg/(m^2). (using current weight) BMI=25.8 (using usual weight)  Food/Nutrition Related Hx: patient reports intake PTA was "so-so"  Labs:  CMP     Component Value Date/Time   NA 136 02/15/2012 0645   K 3.5 02/15/2012 0645   CL 96 02/15/2012 0645   CO2 23 02/15/2012 0645   GLUCOSE 175* 02/15/2012 0645   BUN 24* 02/15/2012 0645   CREATININE 2.89* 02/15/2012 0645   CREATININE 1.78* 10/17/2008 2000   CALCIUM 9.7 02/15/2012 0645   CALCIUM 8.3* 08/21/2011 0533   PROT 6.4 02/14/2012 1447   ALBUMIN 2.1*  02/14/2012 1447   AST 13 02/14/2012 1447   ALT 12 02/14/2012 1447   ALKPHOS 195* 02/14/2012 1447   BILITOT 0.2* 02/14/2012 1447   GFRNONAA 19* 02/15/2012 0645   GFRAA 22* 02/15/2012 0645    Phosphorus  Date/Time Value Range Status  02/15/2012  6:45 AM 4.1  2.3 - 4.6 mg/dL Final  4/69/6295  2:84 PM 6.4* 2.3 - 4.6 mg/dL Final  1/32/4401  0:27 AM 3.3  2.3 - 4.6 mg/dL Final    Potassium  Date/Time Value Range Status  02/15/2012  6:45 AM 3.5  3.5 - 5.1 mEq/L Final     DELTA CHECK NOTED     DIALYSIS  02/14/2012  2:47 PM 4.7  3.5 - 5.1 mEq/L Final  01/22/2012  7:27 AM 3.9  3.5 - 5.1 mEq/L Final    CBG (last 3)   Basename 02/15/12 0739 02/15/12 0408 02/15/12 0011  GLUCAP 186* 139* 227*      Intake/Output Summary (Last 24 hours) at 02/15/12 1148 Last data filed at 02/15/12 1000  Gross per 24 hour  Intake    485 ml  Output    -64 ml  Net    549 ml    Diet Order: NPO  Supplements/Tube Feeding: None  IVF:    sodium chloride Last Rate: 10 mL/hr (02/14/12 1433)  sodium chloride     Estimated Nutritional Needs:   Kcal: 2000-2200 kcals Protein: 115-130 grams Fluid: 1.2 liters  Patient was sleeping during RD visit; unable to stay awake to answer questions.  Suspect weight is above usual weight due to fluid retention.  Per RN, patient to remain NPO until after surgery later today.  Plans are to replace HD catheter today.  Patient with multiple wounds--RLE ulcer, right groin surgical site, left groin surgical site.  Needs increased calories and protein to promote healing.    Phosphorus and potassium are WNL today.  NUTRITION DIAGNOSIS: -Increased nutrient needs (NI-5.1).  Status: Ongoing  RELATED TO: hemodialysis and multiple wounds  AS EVIDENCED BY: estimated protein needs 1.4-1.6 grams/kg  MONITORING/EVALUATION(Goals): Goal:  Intake to meet 90-100% of estimated nutrition needs to promote healing. Monitor:  Diet advancement, PO intake.  EDUCATION NEEDS: -Education not  appropriate at this time  INTERVENTION: Recommend advance diet to Renal 80/90-2-2 after procedure today with Nepro supplement BID.    Dietitian #:  [phone]  DOCUMENTATION CODES Per approved criteria  -Not Applicable    Hettie Holstein 02/15/2012, 11:32 AM

## 2012-02-15 NOTE — Progress Notes (Signed)
  Echocardiogram 2D Echocardiogram has been performed.  Addeline Calarco FRANCES 02/15/2012, 11:48 AM

## 2012-02-16 ENCOUNTER — Inpatient Hospital Stay (HOSPITAL_COMMUNITY): Payer: Medicare Other

## 2012-02-16 DIAGNOSIS — A413 Sepsis due to Hemophilus influenzae: Secondary | ICD-10-CM

## 2012-02-16 DIAGNOSIS — R197 Diarrhea, unspecified: Secondary | ICD-10-CM

## 2012-02-16 DIAGNOSIS — T827XXA Infection and inflammatory reaction due to other cardiac and vascular devices, implants and grafts, initial encounter: Secondary | ICD-10-CM

## 2012-02-16 DIAGNOSIS — N186 End stage renal disease: Secondary | ICD-10-CM

## 2012-02-16 LAB — BASIC METABOLIC PANEL
BUN: 38 mg/dL — ABNORMAL HIGH (ref 6–23)
Creatinine, Ser: 4.18 mg/dL — ABNORMAL HIGH (ref 0.50–1.10)
GFR calc Af Amer: 14 mL/min — ABNORMAL LOW (ref >90)
GFR calc non Af Amer: 12 mL/min — ABNORMAL LOW (ref >90)
Potassium: 3.8 mEq/L (ref 3.5–5.1)

## 2012-02-16 LAB — CBC
MCHC: 31.5 g/dL (ref 30.0–36.0)
RDW: 18 % — ABNORMAL HIGH (ref 11.5–15.5)

## 2012-02-16 LAB — GLUCOSE, CAPILLARY
Glucose-Capillary: 128 mg/dL — ABNORMAL HIGH (ref 70–99)
Glucose-Capillary: 130 mg/dL — ABNORMAL HIGH (ref 70–99)
Glucose-Capillary: 158 mg/dL — ABNORMAL HIGH (ref 70–99)

## 2012-02-16 MED ORDER — NEPRO/CARBSTEADY PO LIQD
237.0000 mL | Freq: Two times a day (BID) | ORAL | Status: DC
  Administered 2012-02-16 – 2012-02-17 (×2): 237 mL via ORAL

## 2012-02-16 MED ORDER — CLOPIDOGREL BISULFATE 75 MG PO TABS
75.0000 mg | ORAL_TABLET | Freq: Every day | ORAL | Status: DC
  Administered 2012-02-16 – 2012-02-17 (×2): 75 mg via ORAL
  Filled 2012-02-16 (×3): qty 1

## 2012-02-16 MED ORDER — DEXTROSE 5 % IV SOLN
1.0000 g | Freq: Every day | INTRAVENOUS | Status: DC
  Administered 2012-02-16: 1 g via INTRAVENOUS
  Filled 2012-02-16 (×2): qty 1

## 2012-02-16 MED ORDER — CINACALCET HCL 30 MG PO TABS
30.0000 mg | ORAL_TABLET | Freq: Every day | ORAL | Status: DC
  Administered 2012-02-16: 30 mg via ORAL
  Filled 2012-02-16 (×2): qty 1

## 2012-02-16 MED ORDER — ALBUTEROL SULFATE (5 MG/ML) 0.5% IN NEBU: 2.5000 mg | INHALATION_SOLUTION | RESPIRATORY_TRACT | Status: DC | PRN

## 2012-02-16 MED ORDER — IPRATROPIUM BROMIDE 0.02 % IN SOLN: 0.5000 mg | RESPIRATORY_TRACT | Status: DC | PRN

## 2012-02-16 MED ORDER — ALBUTEROL SULFATE (5 MG/ML) 0.5% IN NEBU: 2.5000 mg | INHALATION_SOLUTION | RESPIRATORY_TRACT | Status: DC

## 2012-02-16 MED ORDER — CEFTAZIDIME 1 G IJ SOLR: 1.0000 g | Freq: Every day | INTRAMUSCULAR | Status: DC

## 2012-02-16 MED ORDER — VANCOMYCIN HCL IN DEXTROSE 1-5 GM/200ML-% IV SOLN
1000.0000 mg | Freq: Once | INTRAVENOUS | Status: AC
  Administered 2012-02-16: 1000 mg via INTRAVENOUS
  Filled 2012-02-16: qty 200

## 2012-02-16 MED ORDER — VITAMIN B-6 100 MG PO TABS: 100.0000 mg | ORAL_TABLET | Freq: Every day | ORAL | Status: DC

## 2012-02-16 MED ORDER — DARBEPOETIN ALFA-POLYSORBATE 60 MCG/0.3ML IJ SOLN
INTRAMUSCULAR | Status: AC
  Administered 2012-02-16: 60 ug via INTRAVENOUS
  Filled 2012-02-16: qty 0.3

## 2012-02-16 MED ORDER — VITAMIN B-6 100 MG PO TABS
100.0000 mg | ORAL_TABLET | Freq: Every day | ORAL | Status: DC
  Administered 2012-02-16 – 2012-02-17 (×2): 100 mg via ORAL
  Filled 2012-02-16 (×2): qty 1

## 2012-02-16 NOTE — Progress Notes (Signed)
ANTIBIOTIC CONSULT NOTE - FOLLOW UP  Pharmacy Consult for Vancomycin/Zosyn Indication: UTI, PNA, B groin wound infection, cellulitis LE's  Allergies  Allergen Reactions  . Compazine (Prochlorperazine) Swelling    Per echart records  . Contrast Media (Iodinated Diagnostic Agents)     Per echart records  . Meperidine Hcl Swelling  . Metformin And Related Other (See Comments)    'just not myself, was told not to take metformin'  . Morphine Swelling  . Nubain (Nalbuphine Hcl) Swelling  . Topiramate (Topamax) Swelling    Tongue swelling - per echart records  . Toradol (Ketorolac Tromethamine) Swelling    Patient Measurements: Height: 5\' 10"  (177.8 cm) Weight: 189 lb 6 oz (85.9 kg) IBW/kg (Calculated) : 68.5   Vital Signs: Temp: 98 F (36.7 C) (06/22 0700) Temp src: Oral (06/22 0700) BP: 126/75 mmHg (06/22 0902) Pulse Rate: 101  (06/22 0902) Intake/Output from previous day: 06/21 0701 - 06/22 0700 In: 837.2 [I.V.:527.2; IV Piggyback:310] Out: 52 [Urine:30; Stool:2; Blood:20] Intake/Output from this shift:    Labs:  Basename 02/16/12 0700 02/15/12 0645 02/15/12 0345 02/14/12 1447  WBC 15.5* -- 15.6* 21.1*  HGB 8.7* -- 9.8* 9.7*  PLT 265 -- 269 252  LABCREA -- -- -- --  CREATININE 4.18* 2.89* -- 5.79*   Estimated Creatinine Clearance: 20.9 ml/min (by C-G formula based on Cr of 4.18). No results found for this basename: VANCOTROUGH:2,VANCOPEAK:2,VANCORANDOM:2,GENTTROUGH:2,GENTPEAK:2,GENTRANDOM:2,TOBRATROUGH:2,TOBRAPEAK:2,TOBRARND:2,AMIKACINPEAK:2,AMIKACINTROU:2,AMIKACIN:2, in the last 72 hours   Microbiology: Recent Results (from the past 720 hour(s))  MRSA PCR SCREENING     Status: Normal   Collection Time   02/14/12 12:42 PM      Component Value Range Status Comment   MRSA by PCR NEGATIVE  NEGATIVE Final   URINE CULTURE     Status: Normal   Collection Time   02/14/12  1:20 PM      Component Value Range Status Comment   Specimen Description URINE, CATHETERIZED    Final    Special Requests NONE   Final    Culture  Setup Time [id number]   Final    Colony Count NO GROWTH   Final    Culture NO GROWTH   Final    Report Status 02/15/2012 FINAL   Final   WOUND CULTURE     Status: Normal (Preliminary result)   Collection Time   02/14/12  1:20 PM      Component Value Range Status Comment   Specimen Description WOUND GROIN LEFT   Final    Special Requests NONE   Final    Gram Stain     Final    Value: NO WBC SEEN     NO SQUAMOUS EPITHELIAL CELLS SEEN     NO ORGANISMS SEEN   Culture Culture reincubated for better growth   Final    Report Status PENDING   Incomplete   WOUND CULTURE     Status: Normal (Preliminary result)   Collection Time   02/14/12  1:20 PM      Component Value Range Status Comment   Specimen Description WOUND GROIN RIGHT   Final    Special Requests NONE   Final    Gram Stain     Final    Value: NO WBC SEEN     NO SQUAMOUS EPITHELIAL CELLS SEEN     NO ORGANISMS SEEN   Culture Culture reincubated for better growth   Final    Report Status PENDING   Incomplete   CULTURE, BLOOD (ROUTINE X 2)  Status: Normal (Preliminary result)   Collection Time   02/14/12  2:08 PM      Component Value Range Status Comment   Specimen Description BLOOD RIGHT FOREARM   Final    Special Requests BOTTLES DRAWN AEROBIC ONLY 5CC   Final    Culture  Setup Time [id number]   Final    Culture     Final    Value:        BLOOD CULTURE RECEIVED NO GROWTH TO DATE CULTURE WILL BE HELD FOR 5 DAYS BEFORE ISSUING A FINAL NEGATIVE REPORT   Report Status PENDING   Incomplete   CULTURE, BLOOD (ROUTINE X 2)     Status: Normal (Preliminary result)   Collection Time   02/14/12  2:18 PM      Component Value Range Status Comment   Specimen Description BLOOD LEFT UPPER ARM   Final    Special Requests BOTTLES DRAWN AEROBIC ONLY 3CC   Final    Culture  Setup Time [id number]   Final    Culture     Final    Value:        BLOOD CULTURE RECEIVED NO GROWTH TO DATE  CULTURE WILL BE HELD FOR 5 DAYS BEFORE ISSUING A FINAL NEGATIVE REPORT   Report Status PENDING   Incomplete     Anti-infectives     Start     Dose/Rate Route Frequency Ordered Stop   02/16/12 1000   vancomycin (VANCOCIN) IVPB 1000 mg/200 mL premix     Comments: To be given in HD      1,000 mg 200 mL/hr over 60 Minutes Intravenous  Once 02/16/12 0937     02/15/12 1400   vancomycin (VANCOCIN) 750 mg in sodium chloride 0.9 % 150 mL IVPB        750 mg 150 mL/hr over 60 Minutes Intravenous  Once 02/15/12 1148 02/15/12 1810   02/15/12 0656   cefUROXime (ZINACEF) 1.5 g in dextrose 5 % 50 mL IVPB  Status:  Discontinued        1.5 g 100 mL/hr over 30 Minutes Intravenous 30 min pre-op 02/15/12 0656 02/15/12 1211   02/14/12 1500   vancomycin (VANCOCIN) 750 mg in sodium chloride 0.9 % 150 mL IVPB        750 mg 150 mL/hr over 60 Minutes Intravenous  Once 02/14/12 1354 02/14/12 1541   02/14/12 1500   piperacillin-tazobactam (ZOSYN) IVPB 2.25 g        2.25 g 100 mL/hr over 30 Minutes Intravenous 3 times per day 02/14/12 1354     02/14/12 1315   piperacillin-tazobactam (ZOSYN) IVPB 3.375 g  Status:  Discontinued        3.375 g 12.5 mL/hr over 240 Minutes Intravenous 4 times per day 02/14/12 1305 02/14/12 1351   02/14/12 1315   vancomycin (VANCOCIN) IVPB 1000 mg/200 mL premix  Status:  Discontinued        1,000 mg 200 mL/hr over 60 Minutes Intravenous Every 12 hours 02/14/12 1305 02/14/12 1351          Assessment: 41 yo F transferred from [facility], with ESRD.  On Day#3 vancomycin and Zosyn for empiric coverage of UTI, PNA, B groin wound infection, and cellulitis of LE's.  Patient currently in HD now with 1h 20 min left, therefore will give additional vancomycin dose now to be given in HD.  BFR at 400 with full 4h session planned, will give 1000 mg IV x1 based on weight.  Blood  culture x2 NGTD, urine culture no growth, wound culture pending. Afebrile, WBC trending down at 15.5.    Goal of Therapy:  Pre HD vancomycin level 15-25 mcg/mL  Plan:  1.  Vancomycin 1000 mg IV x 1 today in HD 2.  F/up HD plans for further vancomycin dosing 3.  Continue Zosyn 2.25 gm IV q 8hr 4.  Follow-up cultures, vancomycin levels as needed   Maudry Mayhew, PharmD Pgr [phone] 02/16/2012,9:37 AM

## 2012-02-16 NOTE — Progress Notes (Addendum)
Interval history:  41 yo female with a plethora of health problems. 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 She had aorta bi fem per [facility] 12/28/11 and has bilateral groin open wounds with green malodorous drainage. Further bilateral lower ext wounds are noted . She has hemodialysis Tuesday 6/18 and was in her usual poor state of health. 6-19 had low grade fever and on 6-19 fever 102.5. Taken to [facility] and tx with Vancomycin and Zosyn. She was hypertensive but met sirs criteria and was tx to [facility]. She was admitted to the intensive care unit by the critical care team. She was transferred to the floor on 6/21. The hospitalist service assumed care on 6/22.   Subjective:   Chart reviewed. Patient indicates that she's feeling better. Mild nausea during a hypotensive episode during dialysis but no vomiting. Denies pain. No shortness of breath or chest pain. Patient was seen across dialysis this morning.  Objective  Vital signs in last 24 hours: Filed Vitals:   02/16/12 1027 02/16/12 1033 02/16/12 1101 02/16/12 1400  BP: 120/44 133/61 156/77 129/69  Pulse: 96 92 105 104  Temp:  97.1 F (36.2 C) 99.1 F (37.3 C) 98.9 F (37.2 C)  TempSrc:  Oral Oral Oral  Resp: 18 20 20 20   Height:      Weight:  83.9 kg (184 lb 15.5 oz)    SpO2:  95% 96% 100%   Weight change: 4.252 kg (9 lb 6 oz)  Intake/Output Summary (Last 24 hours) at 02/16/12 1505 Last data filed at 02/16/12 1108  Gross per 24 hour  Intake 807.17 ml  Output   2561 ml  Net -1753.83 ml    Physical Exam:  General Exam: Comfortable.  Respiratory System: Clear. No increased work of breathing.  Cardiovascular System: First and second heart sounds heard. Regular rate and rhythm. No JVD/murmurs. Telemetry shows sinus rhythm in the 90s to sinus tachycardia in the 100s.  Gastrointestinal System: Abdomen is non distended, soft and normal bowel sounds heard.  Nontender. Central Nervous System: Alert and oriented. No focal neurological deficits. Extremities: Symmetric 5 x 5 power.  Labs:  Basic Metabolic Panel:  Lab 02/16/12 1914 02/15/12 0645 02/14/12 1447  NA 138 136 128*  K 3.8 3.5 4.7  CL 98 96 91*  CO2 23 23 17*  GLUCOSE 173* 175* 170*  BUN 38* 24* 61*  CREATININE 4.18* 2.89* 5.79*  CALCIUM 9.9 9.7 10.1  ALB -- -- --  PHOS -- 4.1 6.4*   Liver Function Tests:  Lab 02/14/12 1447  AST 13  ALT 12  ALKPHOS 195*  BILITOT 0.2*  PROT 6.4  ALBUMIN 2.1*   No results found for this basename: LIPASE:3,AMYLASE:3 in the last 168 hours No results found for this basename: AMMONIA:3 in the last 168 hours CBC:  Lab 02/16/12 0700 02/15/12 0345 02/14/12 1447  WBC 15.5* 15.6* 21.1*  NEUTROABS -- -- 19.0*  HGB 8.7* 9.8* 9.7*  HCT 27.6* 30.5* 31.0*  MCV 98.9 96.2 98.4  PLT 265 269 252   Cardiac Enzymes:  Lab 02/15/12 0052 02/14/12 1905 02/14/12 1447  CKTOTAL 31 36 48  CKMB 2.4 2.5 2.2  CKMBINDEX -- -- --  TROPONINI <0.30 <0.30 <0.30   CBG:  Lab 02/16/12 1104 02/16/12 0413 02/16/12 0008 02/15/12 2009 02/15/12 1615  GLUCAP 128* 158* 128* 207* 225*   Microbiology:  1. Catheter tip culture: Pending 2. Blood cultures  x2 (02/14/12): Negative to date. 3. Left groin wound culture: No organisms seen on Gram stain. Culture pending. 4. Right groin wound culture: No organisms seen on Gram stain. Culture pending 5. Urine culture: Negative    Iron Studies: No results found for this basename: IRON,TIBC,TRANSFERRIN,FERRITIN in the last 72 hours Studies/Results: Dg Chest Port 1 View  02/15/2012  *RADIOLOGY REPORT*  Clinical Data: Status post diatek catheter exchange.  PORTABLE CHEST - 1 VIEW  Comparison: Portable chest 02/15/2012.  Findings: The heart size is exaggerated by low lung volumes.  The previous left IJ dialysis catheter has been removed.  A new right IJ dialysis catheter is in place.  The tip the distal limb is at the cavoatrial  junction.  There is some bibasilar atelectasis.  IMPRESSION:  1.  New right IJ dialysis catheter. The tip of the distal lumen is at the cavoatrial junction. 2.  Interval removal of left IJ dialysis catheter. 3.  Mild bibasilar atelectasis.  Original Report Authenticated By: Jamesetta Orleans. MATTERN, M.D.   Dg Chest Port 1 View  02/15/2012  *RADIOLOGY REPORT*  Clinical Data: Shortness of breath.  PORTABLE CHEST - 1 VIEW  Comparison: Portable chest is 02/14/2012.  Findings: A left IJ dialysis catheter is in place.  The heart size is exaggerated by low lung volumes.  There is some residual blunting of the right costophrenic angle, improved from the prior exam.  IMPRESSION:  1.  Slight improved aeration at the lung bases bilaterally. 2.  Persistent low lung volumes. 3.  Persistent blunting of the right costophrenic angle.  Original Report Authenticated By: Jamesetta Orleans. MATTERN, M.D.   Dg Fluoro Guide Cv Line-no Report  02/15/2012  CLINICAL DATA: insertion of dialysis catheter   FLOURO GUIDE CV LINE  Fluoroscopy was utilized by the requesting physician.  No radiographic  interpretation.     Medications:    . sodium chloride 10 mL/hr (02/14/12 1433)      . ipratropium  0.5 mg Nebulization Q4H   And  . albuterol  2.5 mg Nebulization Q4H  . ARIPiprazole  2 mg Oral Daily  . calcium acetate  1,334 mg Oral TID WC  . darbepoetin (ARANESP) injection - DIALYSIS  60 mcg Intravenous Q Sat-HD  . feeding supplement (NEPRO CARB STEADY)  237 mL Oral BID BM  . heparin subcutaneous  5,000 Units Subcutaneous Q8H  . insulin aspart  0-15 Units Subcutaneous Q4H  . insulin glargine  10 Units Subcutaneous QHS  . multivitamin  1 tablet Oral Daily  . mupirocin ointment   Nasal Once  . ofloxacin  1 drop Right Eye TID  . pantoprazole (PROTONIX) IV  40 mg Intravenous QHS  . PARoxetine  80 mg Oral BH-q7a  . piperacillin-tazobactam (ZOSYN)  IV  2.25 g Intravenous Q8H  . sucralfate  1 g Oral BID AC  . vancomycin  750 mg  Intravenous Once  . vancomycin  1,000 mg Intravenous Once  . white petrolatum        I  have reviewed scheduled and prn medications.     Problem/Plan: Active Problems:  Sepsis  Altered mental status  Hypoxemia  HTN (hypertension)  Diabetes mellitus  1. Possible sepsis, present on admission-most likely cath-related. Other sources include possible pneumonia, and groin wounds. Did not require pressors. Left IJ dialysis catheter removed. Continue empiric IV vancomycin and Zosyn. 2. ESRD: Nephrology following. New right IJ dialysis catheter placed on 6/21. Briefly hypotensive on HD today. Improved. Management per nephrology. 3. Encephalopathy: Secondary to  sepsis. Possibly resolved. 4. Anemia: Secondary to chronic kidney disease and critical illness. Slight drop in hemoglobin compared to yesterday. Followup CBC tomorrow. 5. Type 2 diabetes mellitus: Good inpatient control. Continue Lantus and sliding scale insulin. 6. History of CAD and stents: Asymptomatic. Cardiac enzymes were negative. We'll resume Plavix. 7. History of peripheral vascular disease-status post bypass. Chronic wound site incisions. 8. Tobacco abuse: Cessation counseled. 9. Hypertension: Management per nephrology. 10. History of bipolar disorder. Denies delusions, hallucinations, SI/HI. Continue Abilify. 11. Bilateral groin wounds: Wound care nurse consultation from 6/20 appreciated. Vascular surgery consulted-will see patient.   Sariya Trickey 02/16/2012,3:05 PM  LOS: 2 days

## 2012-02-16 NOTE — Progress Notes (Addendum)
Subjective:  Received HD overnight after TPA to cath, tolerated well. UF not documented but goal was 4 liters.  Alert this AM, no complaints  Objective Vital signs in last 24 hours: Filed Vitals:   02/16/12 1027 02/16/12 1033 02/16/12 1101 02/16/12 1400  BP: 120/44 133/61 156/77 129/69  Pulse: 96 92 105 104  Temp:  97.1 F (36.2 C) 99.1 F (37.3 C) 98.9 F (37.2 C)  TempSrc:  Oral Oral Oral  Resp: 18 20 20 20   Height:      Weight:  83.9 kg (184 lb 15.5 oz)    SpO2:  95% 96% 100%   Weight change: 4.252 kg (9 lb 6 oz)  Intake/Output Summary (Last 24 hours) at 02/16/12 1609 Last data filed at 02/16/12 1108  Gross per 24 hour  Intake 507.17 ml  Output   2541 ml  Net -2033.83 ml   Labs: Basic Metabolic Panel:  Lab 02/16/12 0454 02/15/12 0645 02/14/12 1447  NA 138 136 128*  K 3.8 3.5 4.7  CL 98 96 91*  CO2 23 23 17*  GLUCOSE 173* 175* 170*  BUN 38* 24* 61*  CREATININE 4.18* 2.89* 5.79*  CALCIUM 9.9 9.7 10.1  ALB -- -- --  PHOS -- 4.1 6.4*   Liver Function Tests:  Lab 02/14/12 1447  AST 13  ALT 12  ALKPHOS 195*  BILITOT 0.2*  PROT 6.4  ALBUMIN 2.1*   No results found for this basename: LIPASE:3,AMYLASE:3 in the last 168 hours No results found for this basename: AMMONIA:3 in the last 168 hours CBC:  Lab 02/16/12 0700 02/15/12 0345 02/14/12 1447  WBC 15.5* 15.6* 21.1*  NEUTROABS -- -- 19.0*  HGB 8.7* 9.8* 9.7*  HCT 27.6* 30.5* 31.0*  MCV 98.9 96.2 98.4  PLT 265 269 252   Cardiac Enzymes:  Lab 02/15/12 0052 02/14/12 1905 02/14/12 1447  CKTOTAL 31 36 48  CKMB 2.4 2.5 2.2  CKMBINDEX -- -- --  TROPONINI <0.30 <0.30 <0.30   CBG:  Lab 02/16/12 1104 02/16/12 0413 02/16/12 0008 02/15/12 2009 02/15/12 1615  GLUCAP 128* 158* 128* 207* 225*    Iron Studies: No results found for this basename: IRON,TIBC,TRANSFERRIN,FERRITIN in the last 72 hours Studies/Results: Dg Chest Port 1 View  02/15/2012  *RADIOLOGY REPORT*  Clinical Data: Status post diatek catheter  exchange.  PORTABLE CHEST - 1 VIEW  Comparison: Portable chest 02/15/2012.  Findings: The heart size is exaggerated by low lung volumes.  The previous left IJ dialysis catheter has been removed.  A new right IJ dialysis catheter is in place.  The tip the distal limb is at the cavoatrial junction.  There is some bibasilar atelectasis.  IMPRESSION:  1.  New right IJ dialysis catheter.  The tip of the distal lumen is at the cavoatrial junction. 2.  Interval removal of left IJ dialysis catheter. 3.  Mild bibasilar atelectasis.  Original Report Authenticated By: Jamesetta Orleans. MATTERN, M.D.   Dg Chest Port 1 View  02/15/2012  *RADIOLOGY REPORT*  Clinical Data: Shortness of breath.  PORTABLE CHEST - 1 VIEW  Comparison: Portable chest is 02/14/2012.  Findings: A left IJ dialysis catheter is in place.  The heart size is exaggerated by low lung volumes.  There is some residual blunting of the right costophrenic angle, improved from the prior exam.  IMPRESSION:  1.  Slight improved aeration at the lung bases bilaterally. 2.  Persistent low lung volumes. 3.  Persistent blunting of the right costophrenic angle.  Original Report Authenticated  By: CHRISTOPHER W. MATTERN, M.D.   Dg Fluoro Guide Cv Line-no Report  02/15/2012  CLINICAL DATA: insertion of dialysis catheter   FLOURO GUIDE CV LINE  Fluoroscopy was utilized by the requesting physician.  No radiographic  interpretation.     Medications: Infusions:    . sodium chloride 10 mL/hr (02/14/12 1433)    Scheduled Medications:    . ipratropium  0.5 mg Nebulization Q4H   And  . albuterol  2.5 mg Nebulization Q4H  . ARIPiprazole  2 mg Oral Daily  . calcium acetate  1,334 mg Oral TID WC  . cinacalcet  30 mg Oral Q supper  . clopidogrel  75 mg Oral QAC breakfast  . darbepoetin (ARANESP) injection - DIALYSIS  60 mcg Intravenous Q Sat-HD  . feeding supplement (NEPRO CARB STEADY)  237 mL Oral BID BM  . heparin subcutaneous  5,000 Units Subcutaneous Q8H  .  insulin aspart  0-15 Units Subcutaneous Q4H  . insulin glargine  10 Units Subcutaneous QHS  . multivitamin  1 tablet Oral Daily  . mupirocin ointment   Nasal Once  . ofloxacin  1 drop Right Eye TID  . pantoprazole (PROTONIX) IV  40 mg Intravenous QHS  . PARoxetine  80 mg Oral BH-q7a  . piperacillin-tazobactam (ZOSYN)  IV  2.25 g Intravenous Q8H  . pyridOXINE  100 mg Oral Daily  . sucralfate  1 g Oral BID AC  . vancomycin  750 mg Intravenous Once  . vancomycin  1,000 mg Intravenous Once  . white petrolatum      . DISCONTD: pyridoxine  100 mg Oral Daily    have reviewed scheduled and prn medications.  Physical Exam: General:  Alert, no complaints.  Getting stuck for a PT/INR for surgery (cath exchange) Heart: tachy Lungs: mostly clear Abdomen: soft, non tender Extremities: does have edema, trace to 1+ Access- has new R IJ PC, L sided cath is out  I Assessment/ Plan: Pt is a 41 y.o. yo female with ESRD who was admitted on 02/14/2012 with  Fever, elevated WBC and poorly functioning PC  Assessment/Plan: 1. Fever, increased WBC- WBC has improved. On zosyn and vanc, cultures negative to date.  L IJ PC removed and new R IJ PC placed yesterday. This was done as much for malfunction as it was for possible infection. All cultures are negative so far (blood, urine, wound) except cath tip from yest which is P. I think she can be d/c'd tomorrow, would give her a least 1 week of antibiotics (Vanc/Fortaz) with outpt HD while awaiting final results on cath tip culture, or could just give 2 wks empirically.  Discussed with primary MD.  If she does need IV abx with HD, someone will have to notify [facility] dialysis on Monday, if she goes home tomorrow.  2. ESRD - normally TTS at [facility].  Had HD this morning without incident. 3. Anemia- hgb 9.8. Check iron stores and start aranesp 4. Secondary hyperparathyroidism- renavite, phoslo.  Restart renvela at lower dose due to phos in 4's.  Will likely  need higher usual dose when she goes home on usual diet. Resume sensipar.  5. HTN/volume- vol overload improved.  6. PVD- with continued smoking 7. AMS- resolved, alert and oriented.  8. Misc- d/c carafate, can cause alum intoxication in ESRD pts   Vinson Moselle  MD [facility] [phone] pgr    [phone] cell 02/16/2012, 4:24 PM

## 2012-02-17 DIAGNOSIS — N186 End stage renal disease: Secondary | ICD-10-CM

## 2012-02-17 DIAGNOSIS — R197 Diarrhea, unspecified: Secondary | ICD-10-CM

## 2012-02-17 DIAGNOSIS — A413 Sepsis due to Hemophilus influenzae: Secondary | ICD-10-CM

## 2012-02-17 LAB — GLUCOSE, CAPILLARY
Glucose-Capillary: 223 mg/dL — ABNORMAL HIGH (ref 70–99)
Glucose-Capillary: 192 mg/dL — ABNORMAL HIGH (ref 70–99)
Glucose-Capillary: 313 mg/dL — ABNORMAL HIGH (ref 70–99)

## 2012-02-17 LAB — CBC
MCH: 30.7 pg (ref 26.0–34.0)
MCV: 98.7 fL (ref 78.0–100.0)
Platelets: 301 10*3/uL (ref 150–400)
RBC: 3 MIL/uL — ABNORMAL LOW (ref 3.87–5.11)
RDW: 17.8 % — ABNORMAL HIGH (ref 11.5–15.5)

## 2012-02-17 MED ORDER — ALBUTEROL SULFATE HFA 108 (90 BASE) MCG/ACT IN AERS: 2.0000 | INHALATION_SPRAY | Freq: Four times a day (QID) | RESPIRATORY_TRACT | Status: DC | PRN

## 2012-02-17 MED ORDER — ALPRAZOLAM 0.5 MG PO TABS
0.5000 mg | ORAL_TABLET | Freq: Four times a day (QID) | ORAL | Status: DC | PRN
  Administered 2012-02-17: 0.5 mg via ORAL
  Filled 2012-02-17: qty 1

## 2012-02-17 MED ORDER — DEXTROSE 5 % IV SOLN: 1.0000 g | INTRAVENOUS | Status: DC

## 2012-02-17 MED ORDER — PROMETHAZINE HCL 25 MG PO TABS: 25.0000 mg | ORAL_TABLET | Freq: Four times a day (QID) | ORAL | Status: DC | PRN

## 2012-02-17 MED ORDER — TEMAZEPAM 15 MG PO CAPS: 30.0000 mg | ORAL_CAPSULE | Freq: Every day | ORAL | Status: DC

## 2012-02-17 MED ORDER — METOCLOPRAMIDE HCL 10 MG PO TABS: 10.0000 mg | ORAL_TABLET | Freq: Four times a day (QID) | ORAL | Status: DC

## 2012-02-17 MED ORDER — RENA-VITE PO TABS: 1.0000 | ORAL_TABLET | Freq: Every day | ORAL | Status: DC

## 2012-02-17 MED ORDER — INSULIN GLARGINE 100 UNIT/ML ~~LOC~~ SOLN: 15.0000 [IU] | Freq: Every day | SUBCUTANEOUS | Status: DC

## 2012-02-17 MED ORDER — METHOCARBAMOL 500 MG PO TABS
1000.0000 mg | ORAL_TABLET | Freq: Four times a day (QID) | ORAL | Status: DC | PRN
  Filled 2012-02-17: qty 2

## 2012-02-17 MED ORDER — ALPRAZOLAM 1 MG PO TABS: 1.0000 mg | ORAL_TABLET | Freq: Four times a day (QID) | ORAL | Status: DC | PRN

## 2012-02-17 MED ORDER — TRAZODONE HCL 150 MG PO TABS
300.0000 mg | ORAL_TABLET | Freq: Every day | ORAL | Status: DC
  Filled 2012-02-17: qty 2

## 2012-02-17 MED ORDER — BUTALBITAL-APAP-CAFFEINE 50-325-40 MG PO TABS
1.0000 | ORAL_TABLET | Freq: Three times a day (TID) | ORAL | Status: DC | PRN
  Administered 2012-02-17: 1 via ORAL
  Filled 2012-02-17: qty 1

## 2012-02-17 MED ORDER — WHITE PETROLATUM GEL
Status: AC
  Administered 2012-02-17: 10:00:00
  Filled 2012-02-17: qty 5

## 2012-02-17 NOTE — Progress Notes (Signed)
Patient ID: [name], female   DOB: February 10, 1971, 41 y.o.   MRN: [id number]   [facility] Progress Note    Subjective:   Reports to be feeling better and inquires about DC home   Objective:   BP 170/71  Pulse 97  Temp 98.1 F (36.7 C) (Oral)  Resp 16  Ht 5\' 10"  (1.778 m)  Wt 84 kg (185 lb 3 oz)  BMI 26.57 kg/m2  SpO2 93%  LMP 09/03/2008  Physical Exam: QIO:[id number] sitting on side of bed GMW:NUUVO RRR, normal S1 and S2  Resp:CTA bilaterally, no rales/rhonchi ZDG:UYQI, flat, NT, BS normal Ext:1+ chronic edema over LE with stasis dermopathy  Labs: BMET  Lab 02/16/12 0700 02/15/12 0645 02/14/12 1447  NA 138 136 128*  K 3.8 3.5 4.7  CL 98 96 91*  CO2 23 23 17*  GLUCOSE 173* 175* 170*  BUN 38* 24* 61*  CREATININE 4.18* 2.89* 5.79*  ALB -- -- --  CALCIUM 9.9 9.7 10.1  PHOS -- 4.1 6.4*   CBC  Lab 02/17/12 0655 02/16/12 0700 02/15/12 0345 02/14/12 1447  WBC 15.9* 15.5* 15.6* 21.1*  NEUTROABS -- -- -- 19.0*  HGB 9.2* 8.7* 9.8* 9.7*  HCT 29.6* 27.6* 30.5* 31.0*  MCV 98.7 98.9 96.2 98.4  PLT 301 265 269 252    Medications:      . ARIPiprazole  2 mg Oral Daily  . calcium acetate  1,334 mg Oral TID WC  . cefTAZidime (FORTAZ)  IV  1 g Intravenous Q1400  . cinacalcet  30 mg Oral Q supper  . clopidogrel  75 mg Oral QAC breakfast  . darbepoetin (ARANESP) injection - DIALYSIS  60 mcg Intravenous Q Sat-HD  . feeding supplement (NEPRO CARB STEADY)  237 mL Oral BID BM  . heparin subcutaneous  5,000 Units Subcutaneous Q8H  . insulin aspart  0-15 Units Subcutaneous Q4H  . insulin glargine  10 Units Subcutaneous QHS  . multivitamin  1 tablet Oral Daily  . mupirocin ointment   Nasal Once  . ofloxacin  1 drop Right Eye TID  . pantoprazole (PROTONIX) IV  40 mg Intravenous QHS  . PARoxetine  80 mg Oral BH-q7a  . pyridOXINE  100 mg Oral Daily  . vancomycin  1,000 mg Intravenous Once  . DISCONTD: albuterol  2.5 mg Nebulization Q4H  . DISCONTD:  albuterol  2.5 mg Nebulization Q4H  . DISCONTD: cefTAZidime  1 g Intramuscular Q1400  . DISCONTD: ipratropium  0.5 mg Nebulization Q4H  . DISCONTD: piperacillin-tazobactam (ZOSYN)  IV  2.25 g Intravenous Q8H  . DISCONTD: pyridoxine  100 mg Oral Daily  . DISCONTD: sucralfate  1 g Oral BID AC     Assessment/ Plan:   1. Fever/Leukocytosis/AMS/Sepsis- Suspected HD catheter related (changed for infection control and malfunction) vs chronic/draining groin wounds-WBC has improved and seemingly stabilized in mid 15K range. Empirically on fortaz and vancomycin. All cultures are negative so far (blood, urine, wound and cath tip).Possible DC today or tomorrow to complete 2 weeks empiric ABX.  2. ESRD - normally TTS at [facility], will continue on this schedule with daily evaluation for acute needs.  3. Anemia- hgb 9.2. On aranesp  4. Secondary hyperparathyroidism- renavite, phoslo. Restart renvela at lower dose due to phos in 4's. Will likely need higher usual dose when she goes home on usual diet. Resume sensipar.  5. HTN/volume- vol overload improved.  6. PVD with h/o vascular bypass and inability to get permanent access- unfortunately- continues  smoking     Zetta Bills, MD 02/17/2012, 9:17 AM

## 2012-02-17 NOTE — Discharge Summary (Signed)
Patient ID: AMIT MELOY MRN: [id number] DOB/AGE: 42-Dec-1972 42 y.o. Primary Care Physician:BLUTH, Lyda Perone, MD Admit date: 02/14/2012 Discharge date: 02/17/2012    Discharge Diagnoses:   Sepsis with toxic metabolic encephalopathy - most likely cathter related infection.   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 Vancomycin 1 gram iv with dialysis  - for 2 weeks   As of 02/17/2012 10:26 AM            Ceftazidime 1 G SOLR 1 g   Inject 1 g into the vein 3 (three) times a week at dialysis      multivitamin Tabs tablet   Take 1 tablet by mouth daily.         CHANGE how you take these medications         ALPRAZolam 1 MG tablet   Commonly known as: XANAX   Take 1 tablet (1 mg total) by mouth 4 (four) times daily as needed for anxiety. For anxiety   What changed: reasons to take the med      insulin glargine 100 UNIT/ML injection   Commonly known as: LANTUS   Inject 15 Units into the skin at bedtime.   What changed: dose      metoCLOPramide 10 MG tablet   Commonly known as: REGLAN   Take 1 tablet (10 mg total) by mouth 4 (four) times daily.   What changed: dose      promethazine 25 MG tablet   Commonly known as: PHENERGAN   Take 1 tablet (25 mg total) by mouth every 6 (six) hours as needed for nausea. For nausea   What changed: reasons to take the med         CONTINUE taking these medications         albuterol 108 (90 BASE) MCG/ACT inhaler   Commonly known as: PROVENTIL HFA;VENTOLIN HFA      ARIPiprazole 2 MG tablet   Commonly known as: ABILIFY      butalbital-acetaminophen-caffeine 50-325-40 MG per tablet   Commonly known as: FIORICET, ESGIC      cinacalcet 30 MG tablet   Commonly known as: SENSIPAR      clopidogrel 75 MG tablet   Commonly known as: PLAVIX      insulin  lispro 100 UNIT/ML injection   Commonly known as: HUMALOG      methocarbamol 500 MG tablet   Commonly known as: ROBAXIN      ofloxacin 0.3 % ophthalmic solution   Commonly known as: OCUFLOX      omeprazole 40 MG capsule   Commonly known as: PRILOSEC      oxyCODONE-acetaminophen 10-325 MG per tablet   Commonly known as: PERCOCET      PARoxetine 40 MG tablet   Commonly known as: PAXIL      pyridoxine 100 MG tablet   Commonly known as: B-6      sevelamer 800 MG tablet   Commonly known as: RENVELA      SYSTANE LID WIPES Pads      temazepam 30 MG capsule   Commonly known as: RESTORIL      traZODone 100 MG tablet   Commonly known as: DESYREL         STOP taking these medications         sucralfate 1 G tablet  Where to get your medications    These are the prescriptions that you need to pick up.   You may get these medications from any pharmacy.         ALPRAZolam 1 MG tablet   metoCLOPramide 10 MG tablet   multivitamin Tabs tablet   promethazine 25 MG tablet         Information on where to get these meds is not yet available. Ask your nurse or doctor.         dextrose 5 % SOLN 50 mL with cefTAZidime 1 G SOLR 1 g   insulin glargine 100 UNIT/ML injection            Discharged Condition:fair    Consults:renal, VVS, pccm  Significant Diagnostic Studies: Dg Chest Port 1 View  02/15/2012  *RADIOLOGY REPORT*  Clinical Data: Status post diatek catheter exchange.  PORTABLE CHEST - 1 VIEW  Comparison: Portable chest 02/15/2012.  Findings: The heart size is exaggerated by low lung volumes.  The previous left IJ dialysis catheter has been removed.  A new right IJ dialysis catheter is in place.  The tip the distal limb is at the cavoatrial junction.  There is some bibasilar atelectasis.  IMPRESSION:  1.  New right IJ dialysis catheter.  The tip of the distal lumen is at the cavoatrial junction. 2.  Interval removal of left IJ dialysis catheter. 3.  Mild  bibasilar atelectasis.  Original Report Authenticated By: Jamesetta Orleans. MATTERN, M.D.   Dg Chest Port 1 View  02/15/2012  *RADIOLOGY REPORT*  Clinical Data: Shortness of breath.  PORTABLE CHEST - 1 VIEW  Comparison: Portable chest is 02/14/2012.  Findings: A left IJ dialysis catheter is in place.  The heart size is exaggerated by low lung volumes.  There is some residual blunting of the right costophrenic angle, improved from the prior exam.  IMPRESSION:  1.  Slight improved aeration at the lung bases bilaterally. 2.  Persistent low lung volumes. 3.  Persistent blunting of the right costophrenic angle.  Original Report Authenticated By: Jamesetta Orleans. MATTERN, M.D.   Dg Chest Port 1 View  02/14/2012  *RADIOLOGY REPORT*  Clinical Data: Cough.  Sepsis.  PORTABLE CHEST - 1 VIEW  Comparison: 02/14/2012.  Findings: Low volume chest.  Basilar atelectasis is present.  Left IJ dialysis catheters present with the distal tip in the right atrium.  Right pleural effusion.  Cardiopericardial silhouette is enlarged. Patchy density is present at the left lung base, likely representing airspace disease.  Differential considerations include pneumonia or asymmetric/atypical pulmonary edema.  IMPRESSION: 1.  Low volume chest. 2. Small right pleural effusion and right greater than left basilar atelectasis. 3.  Mild cardiomegaly. 4.  Patchy airspace disease at the left base along the left heart border suspicious for lingular pneumonia or edema.  Original Report Authenticated By: Andreas Newport, M.D.   Dg Fluoro Guide Cv Line-no Report  02/15/2012  CLINICAL DATA: insertion of dialysis catheter   FLOURO GUIDE CV LINE  Fluoroscopy was utilized by the requesting physician.  No radiographic  interpretation.      Lab Results: Results for orders placed during the hospital encounter of 02/14/12 (from the past 48 hour(s))  APTT     Status: Normal   Collection Time   02/15/12 11:05 AM      Component Value Range Comment   aPTT 37   24 - 37 seconds   PROTIME-INR     Status: Abnormal   Collection Time  02/15/12 11:05 AM      Component Value Range Comment   Prothrombin Time 16.4 (*) 11.6 - 15.2 seconds    INR 1.30  0.00 - 1.49   GLUCOSE, CAPILLARY     Status: Abnormal   Collection Time   02/15/12 12:35 PM      Component Value Range Comment   Glucose-Capillary 208 (*) 70 - 99 mg/dL    Comment 1 Documented in Chart     CATH TIP CULTURE     Status: Normal (Preliminary result)   Collection Time   02/15/12  2:44 PM      Component Value Range Comment   Specimen Description CATH TIP      Special Requests DIALYSIS CATHETER TIP FOR ROUTINE CULTURE      Culture NO GROWTH 1 DAY      Report Status PENDING     GLUCOSE, CAPILLARY     Status: Abnormal   Collection Time   02/15/12  3:08 PM      Component Value Range Comment   Glucose-Capillary 226 (*) 70 - 99 mg/dL    Comment 1 Documented in Chart      Comment 2 Notify RN     GLUCOSE, CAPILLARY     Status: Abnormal   Collection Time   02/15/12  4:15 PM      Component Value Range Comment   Glucose-Capillary 225 (*) 70 - 99 mg/dL   GLUCOSE, CAPILLARY     Status: Abnormal   Collection Time   02/15/12  8:09 PM      Component Value Range Comment   Glucose-Capillary 207 (*) 70 - 99 mg/dL   GLUCOSE, CAPILLARY     Status: Abnormal   Collection Time   02/16/12 12:08 AM      Component Value Range Comment   Glucose-Capillary 128 (*) 70 - 99 mg/dL   GLUCOSE, CAPILLARY     Status: Abnormal   Collection Time   02/16/12  4:13 AM      Component Value Range Comment   Glucose-Capillary 158 (*) 70 - 99 mg/dL   BASIC METABOLIC PANEL     Status: Abnormal   Collection Time   02/16/12  7:00 AM      Component Value Range Comment   Sodium 138  135 - 145 mEq/L    Potassium 3.8  3.5 - 5.1 mEq/L    Chloride 98  96 - 112 mEq/L    CO2 23  19 - 32 mEq/L    Glucose, Bld 173 (*) 70 - 99 mg/dL    BUN 38 (*) 6 - 23 mg/dL    Creatinine, Ser 1.61 (*) 0.50 - 1.10 mg/dL    Calcium 9.9  8.4 - 09.6  mg/dL    GFR calc non Af Amer 12 (*) >90 mL/min    GFR calc Af Amer 14 (*) >90 mL/min   CBC     Status: Abnormal   Collection Time   02/16/12  7:00 AM      Component Value Range Comment   WBC 15.5 (*) 4.0 - 10.5 K/uL    RBC 2.79 (*) 3.87 - 5.11 MIL/uL    Hemoglobin 8.7 (*) 12.0 - 15.0 g/dL    HCT 04.5 (*) 40.9 - 46.0 %    MCV 98.9  78.0 - 100.0 fL    MCH 31.2  26.0 - 34.0 pg    MCHC 31.5  30.0 - 36.0 g/dL    RDW 81.1 (*) 91.4 - 15.5 %  Platelets 265  150 - 400 K/uL   GLUCOSE, CAPILLARY     Status: Abnormal   Collection Time   02/16/12 11:04 AM      Component Value Range Comment   Glucose-Capillary 128 (*) 70 - 99 mg/dL   GLUCOSE, CAPILLARY     Status: Abnormal   Collection Time   02/16/12  4:25 PM      Component Value Range Comment   Glucose-Capillary 237 (*) 70 - 99 mg/dL   GLUCOSE, CAPILLARY     Status: Abnormal   Collection Time   02/16/12  7:55 PM      Component Value Range Comment   Glucose-Capillary 228 (*) 70 - 99 mg/dL    Comment 1 Notify RN     GLUCOSE, CAPILLARY     Status: Abnormal   Collection Time   02/16/12 11:51 PM      Component Value Range Comment   Glucose-Capillary 130 (*) 70 - 99 mg/dL    Comment 1 Notify RN     GLUCOSE, CAPILLARY     Status: Abnormal   Collection Time   02/17/12  4:09 AM      Component Value Range Comment   Glucose-Capillary 223 (*) 70 - 99 mg/dL    Comment 1 Notify RN     CBC     Status: Abnormal   Collection Time   02/17/12  6:55 AM      Component Value Range Comment   WBC 15.9 (*) 4.0 - 10.5 K/uL    RBC 3.00 (*) 3.87 - 5.11 MIL/uL    Hemoglobin 9.2 (*) 12.0 - 15.0 g/dL    HCT 40.9 (*) 81.1 - 46.0 %    MCV 98.7  78.0 - 100.0 fL    MCH 30.7  26.0 - 34.0 pg    MCHC 31.1  30.0 - 36.0 g/dL    RDW 91.4 (*) 78.2 - 15.5 %    Platelets 301  150 - 400 K/uL   GLUCOSE, CAPILLARY     Status: Abnormal   Collection Time   02/17/12  8:13 AM      Component Value Range Comment   Glucose-Capillary 192 (*) 70 - 99 mg/dL    Recent Results  (from the past 240 hour(s))  MRSA PCR SCREENING     Status: Normal   Collection Time   02/14/12 12:42 PM      Component Value Range Status Comment   MRSA by PCR NEGATIVE  NEGATIVE Final   URINE CULTURE     Status: Normal   Collection Time   02/14/12  1:20 PM      Component Value Range Status Comment   Specimen Description URINE, CATHETERIZED   Final    Special Requests NONE   Final    Culture  Setup Time [id number]   Final    Colony Count NO GROWTH   Final    Culture NO GROWTH   Final    Report Status 02/15/2012 FINAL   Final   WOUND CULTURE     Status: Normal (Preliminary result)   Collection Time   02/14/12  1:20 PM      Component Value Range Status Comment   Specimen Description WOUND GROIN LEFT   Final    Special Requests NONE   Final    Gram Stain     Final    Value: NO WBC SEEN     NO SQUAMOUS EPITHELIAL CELLS SEEN     NO ORGANISMS SEEN  Culture     Final    Value: RARE STAPHYLOCOCCUS AUREUS     Note: RIFAMPIN AND GENTAMICIN SHOULD NOT BE USED AS SINGLE DRUGS FOR TREATMENT OF STAPH INFECTIONS.   Report Status PENDING   Incomplete   WOUND CULTURE     Status: Normal (Preliminary result)   Collection Time   02/14/12  1:20 PM      Component Value Range Status Comment   Specimen Description WOUND GROIN RIGHT   Final    Special Requests NONE   Final    Gram Stain     Final    Value: NO WBC SEEN     NO SQUAMOUS EPITHELIAL CELLS SEEN     NO ORGANISMS SEEN   Culture FEW PSEUDOMONAS AERUGINOSA   Final    Report Status PENDING   Incomplete   CULTURE, BLOOD (ROUTINE X 2)     Status: Normal (Preliminary result)   Collection Time   02/14/12  2:08 PM      Component Value Range Status Comment   Specimen Description BLOOD RIGHT FOREARM   Final    Special Requests BOTTLES DRAWN AEROBIC ONLY 5CC   Final    Culture  Setup Time [id number]   Final    Culture     Final    Value:        BLOOD CULTURE RECEIVED NO GROWTH TO DATE CULTURE WILL BE HELD FOR 5 DAYS BEFORE ISSUING A FINAL  NEGATIVE REPORT   Report Status PENDING   Incomplete   CULTURE, BLOOD (ROUTINE X 2)     Status: Normal (Preliminary result)   Collection Time   02/14/12  2:18 PM      Component Value Range Status Comment   Specimen Description BLOOD LEFT UPPER ARM   Final    Special Requests BOTTLES DRAWN AEROBIC ONLY 3CC   Final    Culture  Setup Time [id number]   Final    Culture     Final    Value:        BLOOD CULTURE RECEIVED NO GROWTH TO DATE CULTURE WILL BE HELD FOR 5 DAYS BEFORE ISSUING A FINAL NEGATIVE REPORT   Report Status PENDING   Incomplete   CATH TIP CULTURE     Status: Normal (Preliminary result)   Collection Time   02/15/12  2:44 PM      Component Value Range Status Comment   Specimen Description CATH TIP   Final    Special Requests DIALYSIS CATHETER TIP FOR ROUTINE CULTURE   Final    Culture NO GROWTH 1 DAY   Final    Report Status PENDING   Incomplete      Hospital Course: 1. Sepsis, present on admission-most likely cath-related. Did not require pressors. Left IJ dialysis catheter removed. Received empiric IV vancomycin and Zosyn. Switched to vancomycin and Fortaz at DC to be able to administer during dialysis. Blood cultures and wound culture negative 2. ESRD: Nephrology followed in house. New right IJ dialysis catheter placed on 6/21.  3. Encephalopathy: Secondary to sepsis. Possibly resolved. 4. Anemia: Secondary to chronic kidney disease and critical illness.  5. Type 2 diabetes mellitus: Good inpatient control. Continue Lantus and sliding scale insulin. 6. History of CAD and stents: Asymptomatic. Cardiac enzymes were negative. We'll resume Plavix. 7. History of peripheral vascular disease-status post bypass. Chronic wound site incisions. 8. Tobacco abuse: Cessation counseled. 9. Hypertension: Management per nephrology.     Discharge Exam: Blood pressure 170/71, pulse 97, temperature 98.1  F (36.7 C), temperature source Oral, resp. rate 16, height 5\' 10"  (1.778 m), weight  84 kg (185 lb 3 oz), last menstrual period 09/03/2008, SpO2 93.00%. Alert and oriented x3 Cvs: rrr Rs: ctab   Disposition: home  Discharge Orders    Future Appointments: Provider: Department: Dept Phone: Center:   02/18/2012 11:30 AM Malissa Hippo, MD [facility] [phone] None   02/25/2012 1:30 PM Nada Libman, MD [facility] [phone] [facility]     Future Orders Please Complete By Expires   Increase activity slowly         Follow-up Information    Follow up with Ernestine Conrad, MD.   Contact information:   [facility] [address] [place] [address] [phone]         Dc time 40 minutes Signed: D'Arcy Abraha 02/17/2012, 10:26 AM

## 2012-02-18 ENCOUNTER — Ambulatory Visit (INDEPENDENT_AMBULATORY_CARE_PROVIDER_SITE_OTHER): Payer: Medicare Other | Admitting: Internal Medicine

## 2012-02-18 ENCOUNTER — Encounter (INDEPENDENT_AMBULATORY_CARE_PROVIDER_SITE_OTHER): Payer: Self-pay | Admitting: Internal Medicine

## 2012-02-18 VITALS — BP 160/80 | HR 88 | Temp 98.9°F | Ht 70.5 in | Wt 181.0 lb

## 2012-02-18 DIAGNOSIS — K3184 Gastroparesis: Secondary | ICD-10-CM

## 2012-02-18 DIAGNOSIS — R112 Nausea with vomiting, unspecified: Secondary | ICD-10-CM

## 2012-02-18 LAB — CATH TIP CULTURE: Culture: NO GROWTH

## 2012-02-18 LAB — WOUND CULTURE: Gram Stain: NONE SEEN

## 2012-02-18 MED ORDER — PROMETHAZINE HCL 25 MG PO TABS: 25.0000 mg | ORAL_TABLET | Freq: Four times a day (QID) | ORAL | Status: DC | PRN

## 2012-02-18 NOTE — Patient Instructions (Signed)
Rx for PHenergan 25mg  e prescribed to [facility]. Continue Reglan.  OV in 3 months.

## 2012-02-18 NOTE — Progress Notes (Addendum)
Subjective:     Patient ID: [name], female   DOB: 1971-04-15, 41 y.o.   MRN: [id number]  HPI [name] is here for a scheduled visit. Hx of chronic nausea and vomiting.  She has nausea every day.  Her nausea is worse with dialysis. She is on Phenergan po for her nausea. Appetite is okay. No weight loss.  She has epigastric tenderness.  She tells me she recently underwent by pass surgery on her leg for occulusion by Dr. Emmaline [facility]  She tells me she has one a day.  Hx has a hx of gastroparesis and is on Reglan for this.  No side effects with the Reglan.  She was discharged from the hospital yesterday. She apparently was septic with pneumonia. She was admitted x 4 days. She was + for MRSA. She is a dialysis patient.       Review of Systems see hpi    Current Outpatient Prescriptions  Medication Sig Dispense Refill  . albuterol (PROVENTIL HFA;VENTOLIN HFA) 108 (90 BASE) MCG/ACT inhaler Inhale 2 puffs into the lungs every 6 (six) hours as needed. Shortness of breath      . ALPRAZolam (XANAX) 1 MG tablet Take 1 tablet (1 mg total) by mouth 4 (four) times daily as needed for anxiety. For anxiety  30 tablet  0  . ARIPiprazole (ABILIFY) 2 MG tablet Take 2 mg by mouth daily.       . butalbital-acetaminophen-caffeine (FIORICET, ESGIC) 50-325-40 MG per tablet Take 1 tablet by mouth 3 (three) times daily as needed. For headaches.      . cinacalcet (SENSIPAR) 30 MG tablet Take 30 mg by mouth daily with supper.       . clopidogrel (PLAVIX) 75 MG tablet Take 75 mg by mouth daily. Last dose 12/17/2011      . insulin glargine (LANTUS) 100 UNIT/ML injection Inject 15 Units into the skin at bedtime.  10 mL  0  . insulin lispro (HUMALOG) 100 UNIT/ML injection Inject 7-15 Units into the skin 4 (four) times daily -  before meals and at bedtime. TAKING ON SLIDING SCALE 200-249=7 units 250-299=10 units 300-349=12 units >350=15 units      . methocarbamol (ROBAXIN) 500 MG tablet Take 1,000 mg  by mouth 4 (four) times daily as needed. For muscle spasms      . metoCLOPramide (REGLAN) 10 MG tablet Take 1 tablet (10 mg total) by mouth 4 (four) times daily.  120 tablet  0  . multivitamin (RENA-VIT) TABS tablet Take 1 tablet by mouth daily.  30 tablet  0  . omeprazole (PRILOSEC) 40 MG capsule Take 40 mg by mouth daily.       Marland Kitchen oxyCODONE-acetaminophen (PERCOCET) 10-325 MG per tablet Take 1 tablet by mouth every 4 (four) hours. For pain      . PARoxetine (PAXIL) 40 MG tablet Take 80 mg by mouth every morning.       . promethazine (PHENERGAN) 25 MG tablet Take 1 tablet (25 mg total) by mouth every 6 (six) hours as needed for nausea. For nausea  30 tablet  0  . pyridoxine (B-6) 100 MG tablet Take 100 mg by mouth daily.      . sevelamer (RENVELA) 800 MG tablet Take 1,600-4,000 mg by mouth 5 (five) times daily. *Take 5 tablets daily with each meal and take 2 tablets with snacks**      . temazepam (RESTORIL) 30 MG capsule Take 30 mg by mouth at bedtime.      Marland Kitchen  traZODone (DESYREL) 100 MG tablet Take 300 mg by mouth at bedtime.       Marland Kitchen dextrose 5 % SOLN 50 mL with cefTAZidime 1 G SOLR 1 g Inject 1 g into the vein 3 (three) times a week.      . Eyelid Cleansers (SYSTANE LID WIPES) PADS Place 1 application into the right eye daily.      Marland Kitchen ofloxacin (OCUFLOX) 0.3 % ophthalmic solution Place 1 drop into the right eye 3 (three) times daily.        No current facility-administered medications for this visit.   Facility-Administered Medications Ordered in Other Visits  Medication Dose Route Frequency Provider Last Rate Last Dose  . DISCONTD: 0.9 %  sodium chloride infusion   Intravenous Continuous Alyson Reedy, MD 10 mL/hr at 02/14/12 1433 10 mL/hr at 02/14/12 1433  . DISCONTD: 0.9 %  sodium chloride infusion  100 mL Intravenous PRN Trevor Iha, MD      . DISCONTD: 0.9 %  sodium chloride infusion  100 mL Intravenous PRN Trevor Iha, MD      . DISCONTD: acetaminophen (TYLENOL) tablet 650 mg   650 mg Oral Q4H PRN Lupita Leash, MD   650 mg at 02/17/12 6295  . DISCONTD: albuterol (PROVENTIL HFA;VENTOLIN HFA) 108 (90 BASE) MCG/ACT inhaler 2 puff  2 puff Inhalation Q6H PRN Sorin Luanne Bras, MD      . DISCONTD: albuterol (PROVENTIL) (5 MG/ML) 0.5% nebulizer solution 2.5 mg  2.5 mg Nebulization Q2H PRN Elease Etienne, MD      . DISCONTD: ALPRAZolam Prudy Feeler) tablet 0.5 mg  0.5 mg Oral QID PRN Sorin Luanne Bras, MD   0.5 mg at 02/17/12 1036  . DISCONTD: ARIPiprazole (ABILIFY) tablet 2 mg  2 mg Oral Daily Oretha Milch, MD   2 mg at 02/17/12 1036  . DISCONTD: butalbital-acetaminophen-caffeine (FIORICET, ESGIC) 50-325-40 MG per tablet 1 tablet  1 tablet Oral TID PRN Sorin Luanne Bras, MD   1 tablet at 02/17/12 1036  . DISCONTD: calcium acetate (PHOSLO) capsule 1,334 mg  1,334 mg Oral TID WC Trevor Iha, MD   1,334 mg at 02/17/12 1220  . DISCONTD: cefTAZidime (FORTAZ) 1 g in dextrose 5 % 50 mL IVPB  1 g Intravenous Q1400 Maree Krabbe, MD   1 g at 02/16/12 1816  . DISCONTD: cinacalcet (SENSIPAR) tablet 30 mg  30 mg Oral Q supper Elease Etienne, MD   30 mg at 02/16/12 1817  . DISCONTD: clopidogrel (PLAVIX) tablet 75 mg  75 mg Oral QAC breakfast Elease Etienne, MD   75 mg at 02/17/12 1035  . DISCONTD: darbepoetin (ARANESP) injection 60 mcg  60 mcg Intravenous Q Sat-HD Cecille Aver, MD   60 mcg at 02/16/12 0901  . DISCONTD: feeding supplement (NEPRO CARB STEADY) liquid 237 mL  237 mL Oral PRN Trevor Iha, MD      . DISCONTD: feeding supplement (NEPRO CARB STEADY) liquid 237 mL  237 mL Oral BID BM Elease Etienne, MD   237 mL at 02/17/12 0836  . DISCONTD: heparin injection 1,000 Units  1,000 Units Dialysis PRN Trevor Iha, MD      . DISCONTD: heparin injection 1,800 Units  20 Units/kg Dialysis PRN Cecille Aver, MD      . DISCONTD: heparin injection 5,000 Units  5,000 Units Subcutaneous Q8H Alyson Reedy, MD   5,000 Units at 02/17/12 0511  . DISCONTD: insulin aspart  (novoLOG)  injection 0-15 Units  0-15 Units Subcutaneous Q4H Oretha Milch, MD   11 Units at 02/17/12 1220  . DISCONTD: insulin glargine (LANTUS) injection 10 Units  10 Units Subcutaneous QHS Oretha Milch, MD   10 Units at 02/16/12 2205  . DISCONTD: ipratropium (ATROVENT) nebulizer solution 0.5 mg  0.5 mg Nebulization Q2H PRN Elease Etienne, MD      . DISCONTD: lidocaine (XYLOCAINE) 1 % injection 5 mL  5 mL Intradermal PRN Trevor Iha, MD      . DISCONTD: lidocaine-prilocaine (EMLA) cream 1 application  1 application Topical PRN Trevor Iha, MD      . DISCONTD: methocarbamol (ROBAXIN) tablet 1,000 mg  1,000 mg Oral QID PRN Sorin Luanne Bras, MD      . DISCONTD: multivitamin (RENA-VIT) tablet 1 tablet  1 tablet Oral Daily Trevor Iha, MD   1 tablet at 02/17/12 1035  . DISCONTD: mupirocin ointment (BACTROBAN) 2 %   Nasal Once Chuck Hint, MD      . DISCONTD: ofloxacin (OCUFLOX) 0.3 % ophthalmic solution 1 drop  1 drop Right Eye TID Oretha Milch, MD   1 drop at 02/17/12 1037  . DISCONTD: ondansetron (ZOFRAN) injection 4 mg  4 mg Intravenous Q8H PRN Oretha Milch, MD   4 mg at 02/17/12 1308  . DISCONTD: oxyCODONE-acetaminophen (PERCOCET) 5-325 MG per tablet 1 tablet  1 tablet Oral Q6H PRN Oretha Milch, MD   1 tablet at 02/17/12 0416  . DISCONTD: pantoprazole (PROTONIX) injection 40 mg  40 mg Intravenous QHS Rometta Emery, MD   40 mg at 02/16/12 2204  . DISCONTD: PARoxetine (PAXIL) tablet 80 mg  80 mg Oral BH-q7a Oretha Milch, MD   80 mg at 02/17/12 1035  . DISCONTD: pentafluoroprop-tetrafluoroeth (GEBAUERS) aerosol 1 application  1 application Topical PRN Trevor Iha, MD      . DISCONTD: pyridOXINE (VITAMIN B-6) tablet 100 mg  100 mg Oral Daily Elease Etienne, MD   100 mg at 02/17/12 1036  . DISCONTD: temazepam (RESTORIL) capsule 30 mg  30 mg Oral QHS Sorin Luanne Bras, MD      . DISCONTD: traZODone (DESYREL) tablet 300 mg  300 mg Oral QHS Sorin Luanne Bras, MD       Past  Medical History  Diagnosis Date  . DVT (deep venous thrombosis)     DVT HISTORY  . Diabetes mellitus   . Migraine     HISTORY  . Anxiety and depression   . CAD (coronary artery disease)     Last cath 2009:  LAD stent 80% stenosis. Circumflex 60-70% stenosis AV groove, right coronary artery 50-60% stenosis. She did have cutting balloon angioplasty of the LAD lesion into a diagonal. However, there was restenosis of this and it was managed medically.  . Bipolar 1 disorder   . Hyperlipidemia   . Hyperparathyroidism   . PE (pulmonary embolism)     HISTORY  . Dialysis patient   . Anemia   . Renal failure     dialysis [place] t,th sat  . Nephrotic syndrome   . Peripheral vascular disease   . MRSA infection   . Anxiety   . Depression   . Gastroparesis   . Complication of anesthesia   . PONV (postoperative nausea and vomiting)     x3 days post anesth. on 12/19/2011  . Myocardial infarction     x3 , last one - 10 yrs. ago  .  Hypertension     sees Dr. Antoine Poche - [place], last stress test 11/2011, see result in EPIC, saw Dr. Antoine Poche as well at that time    . Shortness of breath   . Recurrent upper respiratory infection (URI)     Bronchitis 11/2011 - saw Dr. Loney Hering, treating currently /w antibiotic   . Pneumonia     [facility]- 2012  . GERD (gastroesophageal reflux disease)    Past Surgical History  Procedure Date  . Incise and drain abcess     OF THIGHS FROM INSULIN INJECTIONS  . Heart stents   . Portacath placement 2003  . Cataract extraction w/phaco 11/09/2011    Procedure: CATARACT EXTRACTION PHACO AND INTRAOCULAR LENS PLACEMENT (IOC);  Surgeon: Edmon Crape, MD;  Location: [facility] OR;  Service: Ophthalmology;  Laterality: Right;  . Pars plana vitrectomy 11/09/2011    Procedure: PARS PLANA VITRECTOMY WITH 23 GAUGE;  Surgeon: Edmon Crape, MD;  Location: [facility] OR;  Service: Ophthalmology;  Laterality: Right;  Ahmed Valve; Scleral Reinforcement Graft  . Dg av dialysis  shunt access exist*l* or  12/13/11    left arm  . Dialysis cath inserted & portacath      dialysis catheter inserted & portacath d/c'd- 12/19/2011  . Back surgery X 4    x4 times   . Cholecystectomy   . Eye surgery   . Cardiac catheterization   . Aorta - bilateral femoral artery bypass graft 12/28/2011    Procedure: AORTA BIFEMORAL BYPASS GRAFT;  Surgeon: Nada Libman, MD;  Location: [facility] OR;  Service: Vascular;  Laterality: N/A;  AortoBifemoral Bypass using hemasheild 14x7 graft  . Femoral-popliteal bypass graft 12/28/2011    Procedure: BYPASS GRAFT FEMORAL-POPLITEAL ARTERY;  Surgeon: Nada Libman, MD;  Location: [facility] OR;  Service: Vascular;  Laterality: Right;  right femoral popliteal bypass using 6x80 propaten graft   History   Social History  . Marital Status: Divorced    Spouse Name: N/A    Number of Children: N/A  . Years of Education: N/A   Occupational History  . Not on file.   Social History Main Topics  . Smoking status: Current Everyday Smoker -- 2.0 packs/day for 20 years    Types: Cigarettes  . Smokeless tobacco: Never Used   Comment: pt states she is not ready to quit   . Alcohol Use: No  . Drug Use: No  . Sexually Active: No   Other Topics Concern  . Not on file   Social History Narrative  . No narrative on file   Family Status  Relation Status Death Age  . Mother Alive     mini strokes  . Sister Alive     Hepatitis C  . Other Deceased   . Father Alive   . Brother Alive   . Sister Alive     Back Problems   Allergies  Allergen Reactions  . Compazine (Prochlorperazine) Swelling    Per echart records  . Contrast Media (Iodinated Diagnostic Agents)     Per echart records  . Meperidine Hcl Swelling  . Metformin And Related Other (See Comments)    'just not myself, was told not to take metformin'  . Morphine Swelling  . Nubain (Nalbuphine Hcl) Swelling  . Topiramate (Topamax) Swelling    Tongue swelling - per echart records  . Toradol (Ketorolac Tromethamine) Swelling     Objective:   Physical Exam  Filed Vitals:   02/18/12 1155  Height: 5' 10.5" (1.791 m)  Weight: 181 lb (82.101 kg)     Patient examined from w/c.  Unable to stand due to recent surgery. Alert and oriented. Skin warm and dry. Oral mucosa is moist.   . Sclera anicteric, conjunctivae is pink. Thyroid not enlarged. No cervical lymphadenopathy. Lungs clear. Heart regular rate and rhythm.  Abdomen is soft. Bowel sounds are positive. No hepatomegaly. No abdominal masses felt. Mid line scar noted for her rt femoral bypass.  Cellulitis to both legs. Patient is alert and oriented.     Assessment:   Chronic nausea. Patient has multiple health problems. Hx of gastroparesis.    Plan:    Continue phenergan. Will increase to 4 times a day. Continue the Reglan. Keep scheduled appts with Braughm.

## 2012-02-20 ENCOUNTER — Other Ambulatory Visit: Payer: Self-pay

## 2012-02-20 ENCOUNTER — Encounter (HOSPITAL_COMMUNITY): Payer: Self-pay | Admitting: *Deleted

## 2012-02-20 ENCOUNTER — Telehealth (INDEPENDENT_AMBULATORY_CARE_PROVIDER_SITE_OTHER): Payer: Self-pay | Admitting: *Deleted

## 2012-02-20 DIAGNOSIS — R112 Nausea with vomiting, unspecified: Secondary | ICD-10-CM

## 2012-02-20 DIAGNOSIS — K3184 Gastroparesis: Secondary | ICD-10-CM

## 2012-02-20 LAB — CULTURE, BLOOD (ROUTINE X 2): Culture  Setup Time: 201306202216

## 2012-02-20 MED ORDER — PROMETHAZINE HCL 25 MG PO TABS: 25.0000 mg | ORAL_TABLET | Freq: Four times a day (QID) | ORAL | Status: DC | PRN

## 2012-02-20 MED ORDER — SODIUM CHLORIDE 0.9 % IV SOLN
INTRAVENOUS | Status: DC
  Administered 2012-02-21: 11:00:00 via INTRAVENOUS

## 2012-02-20 MED ORDER — CEFAZOLIN SODIUM 1-5 GM-% IV SOLN: 1.0000 g | Freq: Once | INTRAVENOUS | Status: DC

## 2012-02-20 MED ORDER — CEFAZOLIN SODIUM-DEXTROSE 2-3 GM-% IV SOLR
2.0000 g | INTRAVENOUS | Status: DC
  Filled 2012-02-20: qty 50

## 2012-02-20 NOTE — Telephone Encounter (Signed)
Patient sister called and stated the phenergan you sent to her pharmacy won't be enough to last all month, she usually gets #90, please call her sister Lupita Leash @ [phone]

## 2012-02-21 ENCOUNTER — Encounter (HOSPITAL_COMMUNITY): Payer: Self-pay | Admitting: *Deleted

## 2012-02-21 ENCOUNTER — Encounter (HOSPITAL_COMMUNITY)
Admission: RE | Disposition: A | Discharge status: Home / Self Care | Payer: Self-pay | Source: Ambulatory Visit | Attending: Internal Medicine

## 2012-02-21 ENCOUNTER — Ambulatory Visit (HOSPITAL_COMMUNITY): Payer: Medicare Other | Admitting: Anesthesiology

## 2012-02-21 ENCOUNTER — Inpatient Hospital Stay (HOSPITAL_COMMUNITY)
Admission: RE | Admit: 2012-02-21 | Discharge: 2012-02-23 | DRG: 907 | Disposition: A | Discharge status: Home / Self Care | Payer: Medicare Other | Source: Ambulatory Visit | Attending: Internal Medicine | Admitting: Internal Medicine

## 2012-02-21 ENCOUNTER — Ambulatory Visit (HOSPITAL_COMMUNITY): Payer: Medicare Other

## 2012-02-21 ENCOUNTER — Encounter (HOSPITAL_COMMUNITY): Payer: Self-pay | Admitting: Anesthesiology

## 2012-02-21 DIAGNOSIS — E118 Type 2 diabetes mellitus with unspecified complications: Secondary | ICD-10-CM | POA: Diagnosis present

## 2012-02-21 DIAGNOSIS — R4189 Other symptoms and signs involving cognitive functions and awareness: Secondary | ICD-10-CM

## 2012-02-21 DIAGNOSIS — T4271XA Poisoning by unspecified antiepileptic and sedative-hypnotic drugs, accidental (unintentional), initial encounter: Secondary | ICD-10-CM | POA: Diagnosis present

## 2012-02-21 DIAGNOSIS — I739 Peripheral vascular disease, unspecified: Secondary | ICD-10-CM

## 2012-02-21 DIAGNOSIS — T424X4A Poisoning by benzodiazepines, undetermined, initial encounter: Principal | ICD-10-CM | POA: Diagnosis present

## 2012-02-21 DIAGNOSIS — R4182 Altered mental status, unspecified: Secondary | ICD-10-CM | POA: Diagnosis present

## 2012-02-21 DIAGNOSIS — L03119 Cellulitis of unspecified part of limb: Secondary | ICD-10-CM

## 2012-02-21 DIAGNOSIS — Y841 Kidney dialysis as the cause of abnormal reaction of the patient, or of later complication, without mention of misadventure at the time of the procedure: Secondary | ICD-10-CM | POA: Diagnosis present

## 2012-02-21 DIAGNOSIS — T82898A Other specified complication of vascular prosthetic devices, implants and grafts, initial encounter: Secondary | ICD-10-CM | POA: Diagnosis present

## 2012-02-21 DIAGNOSIS — N186 End stage renal disease: Secondary | ICD-10-CM

## 2012-02-21 DIAGNOSIS — Z86718 Personal history of other venous thrombosis and embolism: Secondary | ICD-10-CM

## 2012-02-21 DIAGNOSIS — E119 Type 2 diabetes mellitus without complications: Secondary | ICD-10-CM | POA: Diagnosis present

## 2012-02-21 DIAGNOSIS — L97919 Non-pressure chronic ulcer of unspecified part of right lower leg with unspecified severity: Secondary | ICD-10-CM | POA: Diagnosis present

## 2012-02-21 DIAGNOSIS — I10 Essential (primary) hypertension: Secondary | ICD-10-CM | POA: Diagnosis present

## 2012-02-21 DIAGNOSIS — I70219 Atherosclerosis of native arteries of extremities with intermittent claudication, unspecified extremity: Secondary | ICD-10-CM | POA: Diagnosis present

## 2012-02-21 DIAGNOSIS — F319 Bipolar disorder, unspecified: Secondary | ICD-10-CM | POA: Diagnosis present

## 2012-02-21 DIAGNOSIS — F1310 Sedative, hypnotic or anxiolytic abuse, uncomplicated: Secondary | ICD-10-CM

## 2012-02-21 DIAGNOSIS — T424X1A Poisoning by benzodiazepines, accidental (unintentional), initial encounter: Secondary | ICD-10-CM | POA: Diagnosis present

## 2012-02-21 DIAGNOSIS — Z79899 Other long term (current) drug therapy: Secondary | ICD-10-CM

## 2012-02-21 DIAGNOSIS — R404 Transient alteration of awareness: Secondary | ICD-10-CM | POA: Diagnosis present

## 2012-02-21 DIAGNOSIS — T50995A Adverse effect of other drugs, medicaments and biological substances, initial encounter: Secondary | ICD-10-CM

## 2012-02-21 DIAGNOSIS — I2510 Atherosclerotic heart disease of native coronary artery without angina pectoris: Secondary | ICD-10-CM | POA: Diagnosis present

## 2012-02-21 DIAGNOSIS — I120 Hypertensive chronic kidney disease with stage 5 chronic kidney disease or end stage renal disease: Secondary | ICD-10-CM | POA: Diagnosis present

## 2012-02-21 DIAGNOSIS — L0390 Cellulitis, unspecified: Secondary | ICD-10-CM

## 2012-02-21 DIAGNOSIS — Z992 Dependence on renal dialysis: Secondary | ICD-10-CM | POA: Diagnosis present

## 2012-02-21 DIAGNOSIS — L97909 Non-pressure chronic ulcer of unspecified part of unspecified lower leg with unspecified severity: Secondary | ICD-10-CM | POA: Diagnosis present

## 2012-02-21 DIAGNOSIS — L02419 Cutaneous abscess of limb, unspecified: Secondary | ICD-10-CM

## 2012-02-21 HISTORY — PX: INSERTION OF DIALYSIS CATHETER: SHX1324

## 2012-02-21 HISTORY — PX: VENOGRAM: SHX5497

## 2012-02-21 LAB — GLUCOSE, CAPILLARY
Glucose-Capillary: 201 mg/dL — ABNORMAL HIGH (ref 70–99)
Glucose-Capillary: 296 mg/dL — ABNORMAL HIGH (ref 70–99)

## 2012-02-21 LAB — COMPREHENSIVE METABOLIC PANEL
ALT: 9 U/L (ref 0–35)
AST: 11 U/L (ref 0–37)
Alkaline Phosphatase: 176 U/L — ABNORMAL HIGH (ref 39–117)
CO2: 21 mEq/L (ref 19–32)
Chloride: 97 mEq/L (ref 96–112)
GFR calc non Af Amer: 13 mL/min — ABNORMAL LOW (ref >90)
Potassium: 4.5 mEq/L (ref 3.5–5.1)
Sodium: 136 mEq/L (ref 135–145)
Total Bilirubin: 0.1 mg/dL — ABNORMAL LOW (ref 0.3–1.2)

## 2012-02-21 LAB — DIFFERENTIAL
Eosinophils Absolute: 0 10*3/uL (ref 0.0–0.7)
Eosinophils Relative: 0 % (ref 0–5)
Lymphocytes Relative: 5 % — ABNORMAL LOW (ref 12–46)
Lymphs Abs: 0.7 10*3/uL (ref 0.7–4.0)
Monocytes Relative: 5 % (ref 3–12)

## 2012-02-21 LAB — CBC
Platelets: 349 10*3/uL (ref 150–400)
RBC: 3.15 MIL/uL — ABNORMAL LOW (ref 3.87–5.11)
RDW: 18.1 % — ABNORMAL HIGH (ref 11.5–15.5)
WBC: 14.6 10*3/uL — ABNORMAL HIGH (ref 4.0–10.5)

## 2012-02-21 LAB — POCT I-STAT 4, (NA,K, GLUC, HGB,HCT)
Hemoglobin: 10.5 g/dL — ABNORMAL LOW (ref 12.0–15.0)
Sodium: 130 mEq/L — ABNORMAL LOW (ref 135–145)

## 2012-02-21 SURGERY — INSERTION OF DIALYSIS CATHETER
Anesthesia: Monitor Anesthesia Care | Wound class: Clean

## 2012-02-21 MED ORDER — ONDANSETRON HCL 4 MG PO TABS: 4.0000 mg | ORAL_TABLET | Freq: Four times a day (QID) | ORAL | Status: DC | PRN

## 2012-02-21 MED ORDER — ACETAMINOPHEN 325 MG PO TABS: 650.0000 mg | ORAL_TABLET | Freq: Four times a day (QID) | ORAL | Status: DC | PRN

## 2012-02-21 MED ORDER — SEVELAMER CARBONATE 800 MG PO TABS
4000.0000 mg | ORAL_TABLET | Freq: Three times a day (TID) | ORAL | Status: DC
  Administered 2012-02-22 (×2): 4000 mg via ORAL
  Filled 2012-02-21 (×4): qty 5

## 2012-02-21 MED ORDER — SODIUM CHLORIDE 0.9 % IV SOLN: 250.0000 mL | INTRAVENOUS | Status: DC | PRN

## 2012-02-21 MED ORDER — CLOPIDOGREL BISULFATE 75 MG PO TABS
75.0000 mg | ORAL_TABLET | Freq: Every day | ORAL | Status: DC
  Administered 2012-02-22 – 2012-02-23 (×2): 75 mg via ORAL
  Filled 2012-02-21 (×2): qty 1

## 2012-02-21 MED ORDER — METHYLPREDNISOLONE SODIUM SUCC 125 MG IJ SOLR
125.0000 mg | INTRAMUSCULAR | Status: AC
  Administered 2012-02-21: 125 mg via INTRAVENOUS
  Filled 2012-02-21: qty 2

## 2012-02-21 MED ORDER — OFLOXACIN 0.3 % OP SOLN
1.0000 [drp] | Freq: Three times a day (TID) | OPHTHALMIC | Status: DC
  Administered 2012-02-21 – 2012-02-23 (×5): 1 [drp] via OPHTHALMIC
  Filled 2012-02-21: qty 5

## 2012-02-21 MED ORDER — SEVELAMER CARBONATE 800 MG PO TABS: 1600.0000 mg | ORAL_TABLET | Freq: Every day | ORAL | Status: DC

## 2012-02-21 MED ORDER — INSULIN GLARGINE 100 UNIT/ML ~~LOC~~ SOLN: 30.0000 [IU] | Freq: Every day | SUBCUTANEOUS | Status: DC

## 2012-02-21 MED ORDER — LIDOCAINE-EPINEPHRINE (PF) 1 %-1:200000 IJ SOLN
INTRAMUSCULAR | Status: DC | PRN
  Administered 2012-02-21: 23 mL

## 2012-02-21 MED ORDER — LIDOCAINE-EPINEPHRINE (PF) 1 %-1:200000 IJ SOLN
INTRAMUSCULAR | Status: AC
  Filled 2012-02-21: qty 10

## 2012-02-21 MED ORDER — SODIUM CHLORIDE 0.9 % IR SOLN
Status: DC | PRN
  Administered 2012-02-21: 12:00:00

## 2012-02-21 MED ORDER — SODIUM CHLORIDE 0.9 % IV SOLN
125.0000 mg | Freq: Once | INTRAVENOUS | Status: AC
  Administered 2012-02-21: 125 mg via INTRAVENOUS
  Filled 2012-02-21: qty 10

## 2012-02-21 MED ORDER — CAMPHOR-MENTHOL 0.5-0.5 % EX LOTN
1.0000 "application " | TOPICAL_LOTION | Freq: Three times a day (TID) | CUTANEOUS | Status: DC | PRN
  Filled 2012-02-21: qty 222

## 2012-02-21 MED ORDER — SODIUM CHLORIDE 0.9 % IV SOLN
INTRAVENOUS | Status: DC | PRN
  Administered 2012-02-21: 11:00:00 via INTRAVENOUS

## 2012-02-21 MED ORDER — FAMOTIDINE IN NACL 20-0.9 MG/50ML-% IV SOLN
20.0000 mg | INTRAVENOUS | Status: AC
  Administered 2012-02-21: 20 mg via INTRAVENOUS
  Filled 2012-02-21: qty 50

## 2012-02-21 MED ORDER — ARIPIPRAZOLE 2 MG PO TABS
2.0000 mg | ORAL_TABLET | Freq: Every day | ORAL | Status: DC
  Filled 2012-02-21: qty 1

## 2012-02-21 MED ORDER — SODIUM CHLORIDE 0.9 % IV SOLN
750.0000 mg | Freq: Once | INTRAVENOUS | Status: AC
  Administered 2012-02-21: 750 mg via INTRAVENOUS
  Filled 2012-02-21: qty 750

## 2012-02-21 MED ORDER — CINACALCET HCL 30 MG PO TABS
30.0000 mg | ORAL_TABLET | Freq: Every day | ORAL | Status: DC
  Administered 2012-02-22: 30 mg via ORAL
  Filled 2012-02-21: qty 1

## 2012-02-21 MED ORDER — METOCLOPRAMIDE HCL 10 MG PO TABS
10.0000 mg | ORAL_TABLET | Freq: Four times a day (QID) | ORAL | Status: DC
  Administered 2012-02-22 (×3): 10 mg via ORAL
  Filled 2012-02-21 (×5): qty 1

## 2012-02-21 MED ORDER — ACETAMINOPHEN 650 MG RE SUPP: 650.0000 mg | Freq: Four times a day (QID) | RECTAL | Status: DC | PRN

## 2012-02-21 MED ORDER — SEVELAMER CARBONATE 800 MG PO TABS
1600.0000 mg | ORAL_TABLET | Freq: Two times a day (BID) | ORAL | Status: DC | PRN
  Filled 2012-02-21: qty 2

## 2012-02-21 MED ORDER — SODIUM CHLORIDE 0.9 % IJ SOLN: 3.0000 mL | INTRAMUSCULAR | Status: DC | PRN

## 2012-02-21 MED ORDER — NEPRO/CARBSTEADY PO LIQD
237.0000 mL | Freq: Three times a day (TID) | ORAL | Status: DC | PRN
  Filled 2012-02-21: qty 237

## 2012-02-21 MED ORDER — DIPHENHYDRAMINE HCL 50 MG/ML IJ SOLN
25.0000 mg | INTRAMUSCULAR | Status: AC
  Administered 2012-02-21: 50 mg via INTRAVENOUS
  Filled 2012-02-21: qty 0.5

## 2012-02-21 MED ORDER — DOCUSATE SODIUM 283 MG RE ENEM
1.0000 | ENEMA | Freq: Every day | RECTAL | Status: DC | PRN
  Filled 2012-02-21: qty 1

## 2012-02-21 MED ORDER — CALCIUM CARBONATE 1250 MG/5ML PO SUSP
500.0000 mg | Freq: Four times a day (QID) | ORAL | Status: DC | PRN
  Filled 2012-02-21: qty 5

## 2012-02-21 MED ORDER — SORBITOL 70 % SOLN
30.0000 mL | Status: DC | PRN
  Filled 2012-02-21: qty 30

## 2012-02-21 MED ORDER — ONDANSETRON HCL 4 MG/2ML IJ SOLN: 4.0000 mg | Freq: Four times a day (QID) | INTRAMUSCULAR | Status: DC | PRN

## 2012-02-21 MED ORDER — PAROXETINE HCL 30 MG PO TABS
80.0000 mg | ORAL_TABLET | ORAL | Status: DC
  Administered 2012-02-23: 80 mg via ORAL
  Filled 2012-02-21 (×3): qty 1

## 2012-02-21 MED ORDER — HEPARIN SODIUM (PORCINE) 1000 UNIT/ML IJ SOLN
INTRAMUSCULAR | Status: DC | PRN
  Administered 2012-02-21: 4.6 mL

## 2012-02-21 MED ORDER — HEPARIN SODIUM (PORCINE) 5000 UNIT/ML IJ SOLN
5000.0000 [IU] | Freq: Three times a day (TID) | INTRAMUSCULAR | Status: DC
  Administered 2012-02-21 – 2012-02-23 (×5): 5000 [IU] via SUBCUTANEOUS
  Filled 2012-02-21 (×8): qty 1

## 2012-02-21 MED ORDER — HYDROXYZINE HCL 25 MG PO TABS
25.0000 mg | ORAL_TABLET | Freq: Three times a day (TID) | ORAL | Status: DC | PRN
  Filled 2012-02-21: qty 1

## 2012-02-21 MED ORDER — PROPOFOL 10 MG/ML IV EMUL
INTRAVENOUS | Status: DC | PRN
  Administered 2012-02-21: 75 ug/kg/min via INTRAVENOUS

## 2012-02-21 MED ORDER — DEXTROSE 5 % IV SOLN
2.0000 g | Freq: Once | INTRAVENOUS | Status: AC
  Administered 2012-02-21: 2 g via INTRAVENOUS
  Filled 2012-02-21: qty 2

## 2012-02-21 MED ORDER — 0.9 % SODIUM CHLORIDE (POUR BTL) OPTIME
TOPICAL | Status: DC | PRN
  Administered 2012-02-21: 1000 mL

## 2012-02-21 MED ORDER — OXYCODONE-ACETAMINOPHEN 5-325 MG PO TABS: 1.0000 | ORAL_TABLET | ORAL | Status: AC | PRN

## 2012-02-21 MED ORDER — SODIUM CHLORIDE 0.9 % IJ SOLN
3.0000 mL | Freq: Two times a day (BID) | INTRAMUSCULAR | Status: DC
  Administered 2012-02-22: 3 mL via INTRAVENOUS

## 2012-02-21 MED ORDER — ZOLPIDEM TARTRATE 5 MG PO TABS: 5.0000 mg | ORAL_TABLET | Freq: Every evening | ORAL | Status: DC | PRN

## 2012-02-21 MED ORDER — FENTANYL CITRATE 0.05 MG/ML IJ SOLN: 25.0000 ug | INTRAMUSCULAR | Status: DC | PRN

## 2012-02-21 MED ORDER — HEPARIN SODIUM (PORCINE) 1000 UNIT/ML IJ SOLN
INTRAMUSCULAR | Status: AC
  Filled 2012-02-21: qty 1

## 2012-02-21 MED ORDER — IOHEXOL 300 MG/ML  SOLN
INTRAMUSCULAR | Status: DC | PRN
  Administered 2012-02-21: 65 mL via INTRAVENOUS

## 2012-02-21 SURGICAL SUPPLY — 54 items
BAG DECANTER FOR FLEXI CONT (MISCELLANEOUS) ×2 IMPLANT
BALLOON DORADO 8X40X80 (BALLOONS) ×2 IMPLANT
BALLOON FOXCROSS 10X40X80 (BALLOONS) ×2 IMPLANT
BALLOON FOXCROSS 6X40X135 (BALLOONS) ×2 IMPLANT
BLADE SURG 11 STRL SS (BLADE) ×2 IMPLANT
CATH CANNON HEMO 15F 50CM (CATHETERS) IMPLANT
CATH CANNON HEMO 15FR 19 (HEMODIALYSIS SUPPLIES) IMPLANT
CATH CANNON HEMO 15FR 23CM (HEMODIALYSIS SUPPLIES) ×4 IMPLANT
CATH CANNON HEMO 15FR 31CM (HEMODIALYSIS SUPPLIES) IMPLANT
CATH CANNON HEMO 15FR 32CM (HEMODIALYSIS SUPPLIES) ×2 IMPLANT
CATH STRAIGHT 5FR 65CM (CATHETERS) ×2 IMPLANT
CHLORAPREP W/TINT 26ML (MISCELLANEOUS) ×2 IMPLANT
CLOTH BEACON ORANGE TIMEOUT ST (SAFETY) ×2 IMPLANT
COVER PROBE W GEL 5X96 (DRAPES) IMPLANT
COVER SURGICAL LIGHT HANDLE (MISCELLANEOUS) ×2 IMPLANT
DRAPE CHEST BREAST 15X10 FENES (DRAPES) ×2 IMPLANT
GAUZE SPONGE 2X2 8PLY STRL LF (GAUZE/BANDAGES/DRESSINGS) ×1 IMPLANT
GAUZE SPONGE 4X4 16PLY XRAY LF (GAUZE/BANDAGES/DRESSINGS) ×2 IMPLANT
GLOVE BIO SURGEON STRL SZ7.5 (GLOVE) ×2 IMPLANT
GLOVE BIOGEL M 7.0 STRL (GLOVE) ×2 IMPLANT
GLOVE BIOGEL PI IND STRL 6.5 (GLOVE) ×2 IMPLANT
GLOVE BIOGEL PI IND STRL 7.0 (GLOVE) ×1 IMPLANT
GLOVE BIOGEL PI IND STRL 7.5 (GLOVE) ×2 IMPLANT
GLOVE BIOGEL PI INDICATOR 6.5 (GLOVE) ×2
GLOVE BIOGEL PI INDICATOR 7.0 (GLOVE) ×1
GLOVE BIOGEL PI INDICATOR 7.5 (GLOVE) ×2
GLOVE SURG SS PI 7.0 STRL IVOR (GLOVE) ×2 IMPLANT
GOWN STRL NON-REIN LRG LVL3 (GOWN DISPOSABLE) ×10 IMPLANT
GUIDEWIRE WHOLEY HI TOR 145CM (WIRE) ×2 IMPLANT
KIT BASIN OR (CUSTOM PROCEDURE TRAY) ×2 IMPLANT
KIT ENCORE 26 ADVANTAGE (KITS) ×2 IMPLANT
KIT ROOM TURNOVER OR (KITS) ×2 IMPLANT
NEEDLE 18GX1X1/2 (RX/OR ONLY) (NEEDLE) ×2 IMPLANT
NEEDLE 22X1 1/2 (OR ONLY) (NEEDLE) ×2 IMPLANT
NEEDLE HYPO 25GX1X1/2 BEV (NEEDLE) ×2 IMPLANT
NS IRRIG 1000ML POUR BTL (IV SOLUTION) ×2 IMPLANT
PACK SURGICAL SETUP 50X90 (CUSTOM PROCEDURE TRAY) ×2 IMPLANT
PAD ARMBOARD 7.5X6 YLW CONV (MISCELLANEOUS) ×4 IMPLANT
SHEATH FAST CATH 10F 12CM (SHEATH) ×2 IMPLANT
SNAP KAP ×2 IMPLANT
SPONGE GAUZE 2X2 STER 10/PKG (GAUZE/BANDAGES/DRESSINGS) ×1
SPONGE LAP 18X18 X RAY DECT (DISPOSABLE) ×2 IMPLANT
SUT ETHILON 3 0 PS 1 (SUTURE) ×2 IMPLANT
SUT VICRYL 4-0 PS2 18IN ABS (SUTURE) ×2 IMPLANT
SYR 20CC LL (SYRINGE) ×6 IMPLANT
SYR 30ML LL (SYRINGE) IMPLANT
SYR 5ML LL (SYRINGE) ×4 IMPLANT
SYR CONTROL 10ML LL (SYRINGE) ×2 IMPLANT
SYRINGE 10CC LL (SYRINGE) ×2 IMPLANT
TAPE CLOTH SOFT 2X10 (GAUZE/BANDAGES/DRESSINGS) ×2 IMPLANT
TOWEL OR 17X24 6PK STRL BLUE (TOWEL DISPOSABLE) ×2 IMPLANT
TOWEL OR 17X26 10 PK STRL BLUE (TOWEL DISPOSABLE) ×2 IMPLANT
WATER STERILE IRR 1000ML POUR (IV SOLUTION) ×2 IMPLANT
WIRE BENTSON .035X145CM (WIRE) ×2 IMPLANT

## 2012-02-21 NOTE — Progress Notes (Signed)
Patient has a dressing on her R lower leg and has bilateral dressings on her groin.  All are dry and intact.

## 2012-02-21 NOTE — Transfer of Care (Signed)
Immediate Anesthesia Transfer of Care Note  Patient: [name]  Procedure(s) Performed: Procedure(s) (LRB): INSERTION OF DIALYSIS CATHETER (N/A) VENOGRAM (N/A)  Patient Location: PACU  Anesthesia Type: MAC  Level of Consciousness: sedated  Airway & Oxygen Therapy: Patient Spontanous Breathing and Patient connected to nasal cannula oxygen  Post-op Assessment: Report given to PACU RN and Post -op Vital signs reviewed and stable  Post vital signs: Reviewed  Complications: No apparent anesthesia complications

## 2012-02-21 NOTE — Progress Notes (Signed)
April from rapid response in to evaluate pt level of lethargy. Pt slightly easier to arouse, but remains sleepy.

## 2012-02-21 NOTE — Progress Notes (Signed)
Called by HD RN to see pt for evaluation of readiness for discharge.  On arrival pt is drowsy but easily arouses to voice & A & O & VSS.  Pt requests to go home.  Advised HD RN to contact MD to make decision on pt's readiness for d/c.

## 2012-02-21 NOTE — OR Nursing (Signed)
We used [facility] C-arm

## 2012-02-21 NOTE — H&P (View-Only) (Signed)
[facility]  Subjective  - 41 year old female with extensive past medical history. She has severe peripheral vascular occlusive disease and is status post aortobifemoral bypass and right femoral to popliteal bypass above the knee with Gore-Tex graft by Dr. Myra Gianotti on 12/28/2011. She does have chronic renal failure and had her last hemodialysis 2 days ago. She presents to the emergency room this evening with worsening bilateral lower extremity swelling. He does have significant erythema in her right leg. Her aortofemoral bypass had been for nonhealing ulcers on her right lower extremity. She does have a history of MRSA.   Objective 115/77 116 98.6 F (37 C) (Oral) 18 95%  Intake/Output Summary (Last 24 hours) at 01/22/12 1237 Last data filed at 01/22/12 1104  Gross per 24 hour  Intake    960 ml  Output   3000 ml  Net  -2040 ml   Right incisional wounds healing well.  Distal lateral lower leg wound no active drainage and no edema or erythema.   Bilateral groin wounds no change.   Assessment/Planning: Continue IV antibiotic vancomycin Continue wound care dressing changes Will discuss discharge planning with Dr. Diamond Nickel, [facility] 01/22/2012 12:37 PM --  Laboratory Lab Results:  [facility] 01/22/12 0727  WBC 14.1*  HGB 8.0*  HCT 26.4*  PLT 456*   BMET  Basename 01/22/12 0727 01/21/12 0545  NA 135 --  K 3.9 --  CL 94* --  CO2 28 --  GLUCOSE 106* --  BUN 45* --  CREATININE 5.03* 4.02*  CALCIUM 9.6 --    COAG Lab Results  Component Value Date   INR 1.14 12/28/2011   INR 1.07 12/24/2011   INR 0.96 12/20/2011   No results found for this basename: PTT    Antibiotics Anti-infectives     Start     Dose/Rate Route Frequency Ordered Stop   01/15/12 1200   vancomycin (VANCOCIN) IVPB 1000 mg/200 mL premix        1,000 mg 200 mL/hr over 60 Minutes Intravenous Every T-Th-Sa (Hemodialysis) 01/14/12 2018     01/14/12 1845    vancomycin (VANCOCIN) 750 mg in sodium chloride 0.9 % 150 mL IVPB        750 mg 150 mL/hr over 60 Minutes Intravenous To Emergency Dept 01/14/12 1831 01/14/12 2010   01/14/12 1530   vancomycin (VANCOCIN) IVPB 1000 mg/200 mL premix        1,000 mg 200 mL/hr over 60 Minutes Intravenous  Once 01/14/12 1522 01/14/12 1720

## 2012-02-21 NOTE — Interval H&P Note (Signed)
History and Physical Interval Note:  02/21/2012 10:46 AM  [name]  has presented today for surgery, with the diagnosis of End stage renal disease  The various methods of treatment have been discussed with the patient and family. After consideration of risks, benefits and other options for treatment, the patient has consented to: INSERTION OF DIALYSIS CATHETER (N/A) AND VENOGRAM (N/A) as a surgical intervention .  The patient's history has been reviewed, patient examined, no change in status, stable for surgery.  I have reviewed the patients' chart and labs.  Questions were answered to the patient's satisfaction.     Vila Dory S

## 2012-02-21 NOTE — Progress Notes (Signed)
ANTIBIOTIC CONSULT NOTE - INITIAL  Pharmacy Consult for  Vancomycin Indication:  Infection to open wounds  Allergies  Allergen Reactions  . Compazine (Prochlorperazine) Swelling    Per echart records  . Contrast Media (Iodinated Diagnostic Agents)     Per echart records  . Meperidine Hcl Swelling  . Metformin And Related Other (See Comments)    'just not myself, was told not to take metformin'  . Morphine Swelling  . Nubain (Nalbuphine Hcl) Swelling  . Topiramate (Topamax) Swelling    Tongue swelling - per echart records  . Toradol (Ketorolac Tromethamine) Swelling    Patient Measurements: Height: 5' 10.47" (179 cm) Weight: 191 lb 9.3 oz (86.9 kg) IBW/kg (Calculated) : 69.59   Vital Signs: Temp: 97.3 F (36.3 C) (06/27 1835) Temp src: Oral (06/27 1835) BP: 156/77 mmHg (06/27 2000) Pulse Rate: 102  (06/27 2000)  Labs:  Alvira Philips 02/21/12 0846  WBC --  HGB 10.5*  PLT --  LABCREA --  CREATININE --   Estimated Creatinine Clearance: 21.2 ml/min (by C-G formula based on Cr of 4.18).  Microbiology: Recent Results (from the past 720 hour(s))  MRSA PCR SCREENING     Status: Normal   Collection Time   02/14/12 12:42 PM      Component Value Range Status Comment   MRSA by PCR NEGATIVE  NEGATIVE Final   URINE CULTURE     Status: Normal   Collection Time   02/14/12  1:20 PM      Component Value Range Status Comment   Specimen Description URINE, CATHETERIZED   Final    Special Requests NONE   Final    Culture  Setup Time [id number]   Final    Colony Count NO GROWTH   Final    Culture NO GROWTH   Final    Report Status 02/15/2012 FINAL   Final   WOUND CULTURE     Status: Normal   Collection Time   02/14/12  1:20 PM      Component Value Range Status Comment   Specimen Description WOUND GROIN LEFT   Final    Special Requests NONE   Final    Gram Stain     Final    Value: NO WBC SEEN     NO SQUAMOUS EPITHELIAL CELLS SEEN     NO ORGANISMS SEEN   Culture     Final    Value: RARE METHICILLIN RESISTANT STAPHYLOCOCCUS AUREUS     Note: RIFAMPIN AND GENTAMICIN SHOULD NOT BE USED AS SINGLE DRUGS FOR TREATMENT OF STAPH INFECTIONS. This organism DOES NOT demonstrate inducible Clindamycin resistance in vitro.   Report Status 02/18/2012 FINAL   Final    Organism ID, Bacteria METHICILLIN RESISTANT STAPHYLOCOCCUS AUREUS   Final   WOUND CULTURE     Status: Normal   Collection Time   02/14/12  1:20 PM      Component Value Range Status Comment   Specimen Description WOUND GROIN RIGHT   Final    Special Requests NONE   Final    Gram Stain     Final    Value: NO WBC SEEN     NO SQUAMOUS EPITHELIAL CELLS SEEN     NO ORGANISMS SEEN   Culture FEW PSEUDOMONAS AERUGINOSA   Final    Report Status 02/18/2012 FINAL   Final    Organism ID, Bacteria PSEUDOMONAS AERUGINOSA   Final   CULTURE, BLOOD (ROUTINE X 2)     Status: Normal   Collection  Time   02/14/12  2:08 PM      Component Value Range Status Comment   Specimen Description BLOOD RIGHT FOREARM   Final    Special Requests BOTTLES DRAWN AEROBIC ONLY 5CC   Final    Culture  Setup Time [id number]   Final    Culture NO GROWTH 5 DAYS   Final    Report Status 02/20/2012 FINAL   Final   CULTURE, BLOOD (ROUTINE X 2)     Status: Normal   Collection Time   02/14/12  2:18 PM      Component Value Range Status Comment   Specimen Description BLOOD LEFT UPPER ARM   Final    Special Requests BOTTLES DRAWN AEROBIC ONLY [facility]   Final    Culture  Setup Time [id number]   Final    Culture NO GROWTH 5 DAYS   Final    Report Status 02/20/2012 FINAL   Final   CATH TIP CULTURE     Status: Normal   Collection Time   02/15/12  2:44 PM      Component Value Range Status Comment   Specimen Description CATH TIP   Final    Special Requests DIALYSIS CATHETER TIP FOR ROUTINE CULTURE   Final    Culture NO GROWTH 2 DAYS   Final    Report Status 02/18/2012 FINAL   Final     Medical History: Past Medical History  Diagnosis Date  . DVT (deep  venous thrombosis)     DVT HISTORY  . Diabetes mellitus   . Migraine     HISTORY  . Anxiety and depression   . CAD (coronary artery disease)     Last cath 2009:  LAD stent 80% stenosis. Circumflex 60-70% stenosis AV groove, right coronary artery 50-60% stenosis. She did have cutting balloon angioplasty of the LAD lesion into a diagonal. However, there was restenosis of this and it was managed medically.  . Bipolar 1 disorder   . Hyperlipidemia   . Hyperparathyroidism   . PE (pulmonary embolism)     HISTORY  . Dialysis patient   . Anemia   . Renal failure     dialysis [place] t,th sat  . Nephrotic syndrome   . Peripheral vascular disease   . MRSA infection   . Anxiety   . Depression   . Gastroparesis   . Complication of anesthesia   . PONV (postoperative nausea and vomiting)     x3 days post anesth. on 12/19/2011  . Myocardial infarction     x3 , last one - 10 yrs. ago  . Hypertension     sees Dr. Antoine Poche - [place], last stress test 11/2011, see result in EPIC, saw Dr. Antoine Poche as well at that time    . Shortness of breath   . Recurrent upper respiratory infection (URI)     Bronchitis 11/2011 - saw Dr. Loney Hering, treating currently /w antibiotic   . GERD (gastroesophageal reflux disease)   . Pneumonia     [facility]- 2012   Medications:  Anti-infectives     Start     Dose/Rate Route Frequency Ordered Stop   02/21/12 1400   vancomycin (VANCOCIN) 750 mg in sodium chloride 0.9 % 150 mL IVPB        750 mg 150 mL/hr over 60 Minutes Intravenous  Once 02/21/12 1356 02/21/12 1825   02/21/12 1400   cefTAZidime (FORTAZ) 2 g in dextrose 5 % 50 mL IVPB  2 g 100 mL/hr over 30 Minutes Intravenous  Once 02/21/12 1356 02/21/12 1913   02/20/12 1435   ceFAZolin (ANCEF) IVPB 2 g/50 mL premix  Status:  Discontinued        2 g 100 mL/hr over 30 Minutes Intravenous 60 min pre-op 02/20/12 1435 02/21/12 1356   02/20/12 1433   ceFAZolin (ANCEF) IVPB 1 g/50 mL premix  Status:  Discontinued        1  g 100 mL/hr over 30 Minutes Intravenous  Once 02/20/12 1433 02/20/12 1435         Assessment: 41yo female admitted with fever and multiple medical problems including ESRD.  She has open wounds at the groin site as well as the lower extremities.  She presented to [facility] but was transferred to [facility] for continued therapy.  She has received IV Vancomycin 750mg  x1 today as well as Ceftazidime 2gm.  This will provide adequate antibiotic coverage until her next HD.  Goal of Therapy:  Vancomycin trough 10-20 mcg/ml  Plan:   Continue IV Vancomycin 750mg  with each HD  F/U s/s levels and adjust accordingly.  F/U culture data  Nadara Mustard, PharmD., MS Clinical Pharmacist Pager:  [phone]  Thank you for allowing pharmacy to be part of this patients care team. 02/21/2012,10:30 PM

## 2012-02-21 NOTE — H&P (Signed)
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  Called by nephrology service to directly admit pt overnight as she is arousable to voice but still very sedated.  Surgery done by dr Durwin Nora today.  Patient is currently in dialysis unit and arouses to voice.  She is wheelchair bound at baseline.  Denies any cp, sob, fevers, n/v,abd pain.  She just had fempop surgery last week.  Earlier RN in dialysis unit called rapid response due to patients oversedation, her vitals were all stable without any hypoxia at the time.  Since then per RN patient is more responsive and can now answer questions more clearly.  She has some erythema to her lle but she does not know how long it has been red, she says it is always red.  She is on multiple sedative meds at home and says she took her meds before coming for procedure today.  According also to nephro service, she frequently is over sedated when seen at outpt appts.  Review of Systems:  O/w neg  Past Medical History: Past Medical History  Diagnosis Date  . DVT (deep venous thrombosis)     DVT HISTORY  . Diabetes mellitus   . Migraine     HISTORY  . Anxiety and depression   . CAD (coronary artery disease)     Last cath 2009:  LAD stent 80% stenosis. Circumflex 60-70% stenosis AV groove, right coronary artery 50-60% stenosis. She did have cutting balloon angioplasty of the LAD lesion into a diagonal. However, there was restenosis of this and it was managed medically.  . Bipolar 1 disorder   . Hyperlipidemia   . Hyperparathyroidism   . PE (pulmonary embolism)     HISTORY  . Dialysis patient   . Anemia   . Renal failure     dialysis [place] t,th sat  . Nephrotic syndrome   . Peripheral vascular disease   . MRSA infection   . Anxiety   . Depression   .  Gastroparesis   . Complication of anesthesia   . PONV (postoperative nausea and vomiting)     x3 days post anesth. on 12/19/2011  . Myocardial infarction     x3 , last one - 10 yrs. ago  . Hypertension     sees Dr. Antoine Poche - [place], last stress test 11/2011, see result in EPIC, saw Dr. Antoine Poche as well at that time    . Shortness of breath   . Recurrent upper respiratory infection (URI)     Bronchitis 11/2011 - saw Dr. Loney Hering, treating currently /w antibiotic   . GERD (gastroesophageal reflux disease)   . Pneumonia     [facility]- 2012   Past Surgical History  Procedure Date  . Incise and drain abcess     OF THIGHS FROM INSULIN INJECTIONS  . Heart stents   . Portacath placement 2003  . Cataract extraction w/phaco 11/09/2011    Procedure: CATARACT EXTRACTION PHACO AND INTRAOCULAR LENS PLACEMENT (IOC);  Surgeon: Edmon Crape, MD;  Location: [facility] OR;  Service: Ophthalmology;  Laterality: Right;  . Pars plana vitrectomy 11/09/2011    Procedure: PARS PLANA VITRECTOMY WITH 23 GAUGE;  Surgeon: Edmon Crape, MD;  Location: [facility] OR;  Service: Ophthalmology;  Laterality: Right;  Ahmed Valve; Scleral Reinforcement  Graft  . Dg av dialysis  shunt access exist*l* or 12/13/11    left arm  . Dialysis cath inserted & portacath      dialysis catheter inserted & portacath d/c'd- 12/19/2011  . Back surgery X 4    x4 times   . Cholecystectomy   . Eye surgery   . Cardiac catheterization   . Aorta - bilateral femoral artery bypass graft 12/28/2011    Procedure: AORTA BIFEMORAL BYPASS GRAFT;  Surgeon: Nada Libman, MD;  Location: [facility] OR;  Service: Vascular;  Laterality: N/A;  AortoBifemoral Bypass using hemasheild 14x7 graft  . Femoral-popliteal bypass graft 12/28/2011    Procedure: BYPASS GRAFT FEMORAL-POPLITEAL ARTERY;  Surgeon: Nada Libman, MD;  Location: [facility] OR;  Service: Vascular;  Laterality: Right;  right femoral popliteal bypass using 6x80 propaten graft    Medications: Prior to Admission medications     Medication Sig Start Date End Date Taking? Authorizing Provider  albuterol (PROVENTIL HFA;VENTOLIN HFA) 108 (90 BASE) MCG/ACT inhaler Inhale 2 puffs into the lungs every 6 (six) hours as needed. Shortness of breath   Yes Historical Provider, MD  ALPRAZolam (XANAX) 1 MG tablet Take 1 mg by mouth 3 (three) times daily as needed. For anxiety   Yes Historical Provider, MD  ARIPiprazole (ABILIFY) 2 MG tablet Take 2 mg by mouth daily.    Yes Historical Provider, MD  b complex-vitamin c-folic acid (NEPHRO-VITE) 0.8 MG TABS Take 0.8 mg by mouth at bedtime.   Yes Historical Provider, MD  butalbital-acetaminophen-caffeine (FIORICET, ESGIC) 50-325-40 MG per tablet Take 1 tablet by mouth 3 (three) times daily as needed. For headaches.   Yes Historical Provider, MD  cinacalcet (SENSIPAR) 30 MG tablet Take 30 mg by mouth daily with supper.    Yes Historical Provider, MD  clopidogrel (PLAVIX) 75 MG tablet Take 75 mg by mouth daily. Last dose 12/17/2011   Yes Historical Provider, MD  Eyelid Cleansers (SYSTANE LID WIPES) PADS Place 1 application into the right eye daily.   Yes Historical Provider, MD  hydroxypropyl methylcellulose (ISOPTO TEARS) 2.5 % ophthalmic solution Place 1 drop into both eyes 3 (three) times daily as needed. Dry eyes   Yes Historical Provider, MD  insulin glargine (LANTUS) 100 UNIT/ML injection Inject 30 Units into the skin at bedtime.   Yes Historical Provider, MD  insulin lispro (HUMALOG) 100 UNIT/ML injection Inject 7-15 Units into the skin 4 (four) times daily -  before meals and at bedtime. TAKING ON SLIDING SCALE 200-249=7 units 250-299=10 units 300-349=12 units >350=15 units   Yes Historical Provider, MD  methocarbamol (ROBAXIN) 500 MG tablet Take 1,000 mg by mouth 4 (four) times daily as needed. For muscle spasms   Yes Historical Provider, MD  metoCLOPramide (REGLAN) 10 MG tablet Take 10 mg by mouth 4 (four) times daily.   Yes Historical Provider, MD  ofloxacin (OCUFLOX) 0.3 %  ophthalmic solution Place 1 drop into the right eye 3 (three) times daily.    Yes Historical Provider, MD  omeprazole (PRILOSEC) 40 MG capsule Take 40 mg by mouth daily.  11/12/11  Yes Malissa Hippo, MD  oxyCODONE-acetaminophen (PERCOCET) 10-325 MG per tablet Take 1 tablet by mouth every 4 (four) hours. For pain 01/05/12  Yes Marlowe Shores, PA  PARoxetine (PAXIL) 40 MG tablet Take 80 mg by mouth every morning.    Yes Historical Provider, MD  promethazine (PHENERGAN) 25 MG tablet Take 25 mg by mouth every 6 (six) hours as needed. nausea  Yes Historical Provider, MD  pyridoxine (B-6) 100 MG tablet Take 100 mg by mouth daily.   Yes Historical Provider, MD  sevelamer (RENVELA) 800 MG tablet Take 1,600-4,000 mg by mouth 5 (five) times daily. *Take 5 tablets daily with each meal and take 2 tablets with snacks**   Yes Historical Provider, MD  temazepam (RESTORIL) 30 MG capsule Take 30 mg by mouth at bedtime.   Yes Historical Provider, MD  traZODone (DESYREL) 100 MG tablet Take 300 mg by mouth at bedtime.    Yes Historical Provider, MD  zolpidem (AMBIEN) 5 MG tablet Take 5 mg by mouth at bedtime as needed.   Yes Historical Provider, MD  oxyCODONE-acetaminophen (ROXICET) 5-325 MG per tablet Take 1-2 tablets by mouth every 4 (four) hours as needed for pain. 02/21/12 03/02/12  Chuck Hint, MD    Allergies:   Allergies  Allergen Reactions  . Compazine (Prochlorperazine) Swelling    Per echart records  . Contrast Media (Iodinated Diagnostic Agents)     Per echart records  . Meperidine Hcl Swelling  . Metformin And Related Other (See Comments)    'just not myself, was told not to take metformin'  . Morphine Swelling  . Nubain (Nalbuphine Hcl) Swelling  . Topiramate (Topamax) Swelling    Tongue swelling - per echart records  . Toradol (Ketorolac Tromethamine) Swelling    Social History:  reports that she has been smoking Cigarettes.  She has a 40 pack-year smoking history. She has never  used smokeless tobacco. She reports that she does not drink alcohol or use illicit drugs.  Family History: Family History  Problem Relation Age of Onset  . Transient ischemic attack Mother   . Hepatitis Sister   . Diabetes type II Other   . Anesthesia problems Neg Hx   . Hypotension Neg Hx   . Malignant hyperthermia Neg Hx   . Pseudochol deficiency Neg Hx     Physical Exam: Filed Vitals:   02/21/12 1835 02/21/12 1900 02/21/12 2000 02/21/12 2209  BP: 174/84 181/93 156/77   Pulse: 123 111 102   Temp: 97.3 F (36.3 C)     TempSrc: Oral     Resp: 22 20    Height:    5' 10.47" (1.79 m)  Weight: 86.9 kg (191 lb 9.3 oz)   86.9 kg (191 lb 9.3 oz)  SpO2: 99%      General appearance: cooperative, no distress and slowed mentation sedated Neck: no adenopathy, no JVD and supple, symmetrical, trachea midline Lungs: clear to auscultation bilaterally Heart: regular rate and rhythm, S1, S2 normal, no murmur, click, rub or gallop Abdomen: soft, non-tender; bowel sounds normal; no masses,  no organomegaly Extremities: edema trace ble Pulses: 2+ and symmetric Skin: Skin color, texture, turgor normal. Erythema to ble worse on lle unclear if acute issue or chronic.  Bilateral femoral wounds from surgery last week with no purulent discharge or surrounding erythema Neurologic: Grossly normal    Labs on Admission:   [facility] 02/21/12 0846  NA 130*  K 4.2  CL --  CO2 --  GLUCOSE 217*  BUN --  CREATININE --  CALCIUM --  MG --  PHOS --    Basename 02/21/12 0846  WBC --  NEUTROABS --  HGB 10.5*  HCT 31.0*  MCV --  PLT --    Radiological Exams on Admission: Dg Chest Port 1 View  02/21/2012  *RADIOLOGY REPORT*  Clinical Data: Status post dialysis catheter placement  PORTABLE CHEST -  1 VIEW  Comparison: 02/15/2012  Findings: There is a right chest wall dialysis catheter with tips in the right atrium.  Heart size appears normal.  Diminished aeration to both lung bases may be due to  dependent edema or atelectasis.  Heart size is mildly enlarged as before.  IMPRESSION:  1.  Tips of the right-sided dialysis catheter are in the right atrium. 2.  Diminished aeration to the lung bases which may represent atelectasis or areas of dependent edema.  Original Report Authenticated By: Rosealee Albee, M.D.   Dg Chest Port 1 View  02/15/2012  *RADIOLOGY REPORT*  Clinical Data: Status post diatek catheter exchange.  PORTABLE CHEST - 1 VIEW  Comparison: Portable chest 02/15/2012.  Findings: The heart size is exaggerated by low lung volumes.  The previous left IJ dialysis catheter has been removed.  A new right IJ dialysis catheter is in place.  The tip the distal limb is at the cavoatrial junction.  There is some bibasilar atelectasis.  IMPRESSION:  1.  New right IJ dialysis catheter.  The tip of the distal lumen is at the cavoatrial junction. 2.  Interval removal of left IJ dialysis catheter. 3.  Mild bibasilar atelectasis.  Original Report Authenticated By: Jamesetta Orleans. MATTERN, M.D.   Dg Chest Port 1 View  02/15/2012  *RADIOLOGY REPORT*  Clinical Data: Shortness of breath.  PORTABLE CHEST - 1 VIEW  Comparison: Portable chest is 02/14/2012.  Findings: A left IJ dialysis catheter is in place.  The heart size is exaggerated by low lung volumes.  There is some residual blunting of the right costophrenic angle, improved from the prior exam.  IMPRESSION:  1.  Slight improved aeration at the lung bases bilaterally. 2.  Persistent low lung volumes. 3.  Persistent blunting of the right costophrenic angle.  Original Report Authenticated By: Jamesetta Orleans. MATTERN, M.D.   Dg Chest Port 1 View  02/14/2012  *RADIOLOGY REPORT*  Clinical Data: Cough.  Sepsis.  PORTABLE CHEST - 1 VIEW  Comparison: 02/14/2012.  Findings: Low volume chest.  Basilar atelectasis is present.  Left IJ dialysis catheters present with the distal tip in the right atrium.  Right pleural effusion.  Cardiopericardial silhouette is  enlarged. Patchy density is present at the left lung base, likely representing airspace disease.  Differential considerations include pneumonia or asymmetric/atypical pulmonary edema.  IMPRESSION: 1.  Low volume chest. 2. Small right pleural effusion and right greater than left basilar atelectasis. 3.  Mild cardiomegaly. 4.  Patchy airspace disease at the left base along the left heart border suspicious for lingular pneumonia or edema.  Original Report Authenticated By: Andreas Newport, M.D.   Dg Fluoro Guide Cv Line-no Report  02/15/2012  CLINICAL DATA: insertion of dialysis catheter   FLOURO GUIDE CV LINE  Fluoroscopy was utilized by the requesting physician.  No radiographic  interpretation.      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  obs in stepdown tonight.  Will place on vanc for her ?lle cellulitis.  S/p aortofem bypass last week, will need to make sure she has wound care set up as outpt.  Ck lactic acid and procalcitonin level to make sure not underlying sepsis.  Ck baseline labs.  Higinio Grow A [phone] 02/21/2012, 10:24 PM

## 2012-02-21 NOTE — Progress Notes (Signed)
Dr. Hyman Hopes in to see pt. Pt remains lethargic. Hospitalist called by Dr. Hyman Hopes and pt was transferred to [id number] for observation. Report called to Nedra Hai, RN and pt transported in the bed on a monitor.

## 2012-02-21 NOTE — Progress Notes (Signed)
Hemodialysis complete. Pt remains very lethargic. Arouses momentarily to verbal stimulus then immediately falls back into a snoring sleep. Dr. Hyman Hopes called and notified of this. Rapid response called to evaluate pt for possible admission to the hospital. Sister and mother in and are aware of this. Sister reports that pt took several sleeping pills at home prior to coming to the hospital for insomnia. Recommended that family may want to monitor these medications so this may be prevented in the future.

## 2012-02-21 NOTE — Anesthesia Preprocedure Evaluation (Signed)
Anesthesia Evaluation  Patient identified by MRN, date of birth, ID band Patient confused    Reviewed: Allergy & Precautions, H&P , NPO status , Patient's Chart, lab work & pertinent test results  History of Anesthesia Complications (+) PONV  Airway Mallampati: II  Neck ROM: full    Dental   Pulmonary shortness of breath, Recent URI ,          Cardiovascular hypertension, + CAD, + Past MI, + Cardiac Stents and + Peripheral Vascular Disease     Neuro/Psych  Headaches, Anxiety Depression    GI/Hepatic GERD-  ,  Endo/Other  Diabetes mellitus-, Type 2  Renal/GU      Musculoskeletal   Abdominal   Peds  Hematology   Anesthesia Other Findings   Reproductive/Obstetrics                           Anesthesia Physical Anesthesia Plan  ASA: IV  Anesthesia Plan: MAC   Post-op Pain Management:    Induction: Intravenous  Airway Management Planned: Simple Face Mask  Additional Equipment:   Intra-op Plan:   Post-operative Plan:   Informed Consent: I have reviewed the patients History and Physical, chart, labs and discussed the procedure including the risks, benefits and alternatives for the proposed anesthesia with the patient or authorized representative who has indicated his/her understanding and acceptance.     Plan Discussed with: CRNA and Surgeon  Anesthesia Plan Comments:         Anesthesia Quick Evaluation

## 2012-02-21 NOTE — OR Nursing (Signed)
Patient allergic to contrast dye,patient was pre - medicated by CRNA pre-operatively.

## 2012-02-21 NOTE — Anesthesia Postprocedure Evaluation (Signed)
Anesthesia Post Note  Patient: [name]  Procedure(s) Performed: Procedure(s) (LRB): INSERTION OF DIALYSIS CATHETER (N/A) VENOGRAM (N/A)  Anesthesia type: MAC  Patient location: PACU  Post pain: Pain level controlled and Adequate analgesia  Post assessment: Post-op Vital signs reviewed, Patient's Cardiovascular Status Stable and Respiratory Function Stable  Last Vitals:  Filed Vitals:   02/21/12 0801  BP: 149/85  Pulse: 89  Temp: 37.1 C  Resp: 18    Post vital signs: Reviewed and stable  Level of consciousness: awake, alert  and oriented  Complications: No apparent anesthesia complications

## 2012-02-21 NOTE — Progress Notes (Signed)
NOTIFIED ALLISON ZELENAK PA OF PATIENT BEING EXTREMELY SLEEPY AND DIFFICULT TO AROUSE.  PATIENT IS ABLE TO VERBALIZE NAME AND DOB.  DR HODIERNE TO EVALUATE PATIENT IN OR HOLDING, SISTER TO STAY WITH PATIENT TO SPEAK WITH MD.

## 2012-02-21 NOTE — Progress Notes (Signed)
Patient arrived from post anesthesia care.  She is very drowsy but arouses easily.  Audree Bane [facility] ordered goal to be 4500 as she is aware that patient will be receiving several medication blouses and saline flushes during treatmen.

## 2012-02-21 NOTE — Op Note (Signed)
NAME: VANESS JELINSKI   MRN: [id number] DOB: October 27, 1970    DATE OF OPERATION: 02/21/2012  PREOP DIAGNOSIS: poorly functioning Diatek catheter  POSTOP DIAGNOSIS: same  PROCEDURE:  1. Superior venacavogram 2. Angioplasty of the fibrous sheath in the superior vena cava using 6 mm, 8 mm, and 10 mm balloon. 3. Placement of new 23 cm right IJ Diatek catheter  SURGEON: Di Kindle. Edilia Bo, MD, FACS  ASSIST: Leonides Sake M.D.  ANESTHESIA: local with sedation   EBL: 100 cc  INDICATIONS: EVELETTE HOLLERN is a 41 y.o. female who had a Port-A-Cath in her right IJ or approximately 10 years according to the family. She had a left IJ catheter which is not functioning and so a right IJ catheter was placed. This too was not functioning adequately and she is brought in for placement of a new catheter. I suspected that she had a fiber sheath and we elected to angioplasty the sheath.  FINDINGS: there was a significant fibrous sheath which was ballooned up to a 10 mm diameter. A catheter was placed fairly low in the right atrium and appeared to function adequately.  TECHNIQUE: The patient was brought to the operating room and sedated by anesthesia. The neck and upper chest were prepped and draped in usual sterile fashion. After the skin was anesthetized, an incision was made over the entrance of the catheter into the right IJ. Catheter was dissected free and clamped and then divided. The distal aspect of the catheter was removed. A Benson wire was advanced through the catheter in position in the right atrium. The catheter was removed. A 10 French sheath was placed over the wire and the dilator removed. Superior venacavogram was obtained which did demonstrate a fibrous sheath. I initially selected a 6 mm x 4 cm balloon which was inflated to 10 atmospheres. I inflated in multiple locations along the tract. Repeat superior venacavogram demonstrated residual fibrous sheath. I then ballooned up the tract using an 8 mm x  4 cm balloon in multiple locations along the tract. The dilator and peel-away sheath were then advanced over the wire after the sheath had been removed. The catheter was passed through the peel-away sheath and positioned in the right atrium but there was still evidence of persistent fiber sheath. The wire was then advanced to the catheter and the catheter removed and a 10 French sheath replaced. I then ballooned the fiber sheath again with a 10 mm x 4 cm balloon along multiple tracts of the fiber sheath. This appeared to have a good result. The 10 French sheath was exchanged for a dilator and peel-away sheath and a 23 cm catheter positioned fairly low in the right atrium. There appeared to be some separation of the ports at this point. The exit site of the catheter was selected and the skin anesthetized between the 2 areas. The cath was then brought to the tunnel cut to the appropriate length and the distal ports were attached. Both ports withdrew easily. Cath was then flushed with heparinized saline and filled with concentrated heparin. The cath was secured at its exit site with a 3-0 nylon suture. The IJ cannulation site was closed with a 4-0 subcuticular stitch. Sterile dressing was applied. The patient tolerated the procedure well and was transferred to the recovery room in stable condition. All needle and sponge counts were correct.  Waverly Ferrari, MD, FACS [facility]  DATE OF DICTATION:   02/21/2012

## 2012-02-22 ENCOUNTER — Telehealth: Payer: Self-pay | Admitting: Surgery

## 2012-02-22 ENCOUNTER — Encounter (HOSPITAL_COMMUNITY): Payer: Self-pay

## 2012-02-22 ENCOUNTER — Encounter: Payer: Self-pay | Admitting: Surgery

## 2012-02-22 DIAGNOSIS — N186 End stage renal disease: Secondary | ICD-10-CM

## 2012-02-22 DIAGNOSIS — L02419 Cutaneous abscess of limb, unspecified: Secondary | ICD-10-CM

## 2012-02-22 DIAGNOSIS — F1310 Sedative, hypnotic or anxiolytic abuse, uncomplicated: Secondary | ICD-10-CM

## 2012-02-22 DIAGNOSIS — T50995A Adverse effect of other drugs, medicaments and biological substances, initial encounter: Secondary | ICD-10-CM

## 2012-02-22 DIAGNOSIS — L03119 Cellulitis of unspecified part of limb: Secondary | ICD-10-CM

## 2012-02-22 LAB — BASIC METABOLIC PANEL
BUN: 38 mg/dL — ABNORMAL HIGH (ref 6–23)
CO2: 23 mEq/L (ref 19–32)
Chloride: 95 mEq/L — ABNORMAL LOW (ref 96–112)
Creatinine, Ser: 4.75 mg/dL — ABNORMAL HIGH (ref 0.50–1.10)
Potassium: 4.2 mEq/L (ref 3.5–5.1)

## 2012-02-22 LAB — CBC
HCT: 29 % — ABNORMAL LOW (ref 36.0–46.0)
Hemoglobin: 8.9 g/dL — ABNORMAL LOW (ref 12.0–15.0)
MCHC: 30.7 g/dL (ref 30.0–36.0)
MCV: 98.3 fL (ref 78.0–100.0)
RDW: 18.5 % — ABNORMAL HIGH (ref 11.5–15.5)
WBC: 19.6 10*3/uL — ABNORMAL HIGH (ref 4.0–10.5)

## 2012-02-22 LAB — MRSA PCR SCREENING: MRSA by PCR: NEGATIVE

## 2012-02-22 LAB — GLUCOSE, CAPILLARY
Glucose-Capillary: 256 mg/dL — ABNORMAL HIGH (ref 70–99)
Glucose-Capillary: 301 mg/dL — ABNORMAL HIGH (ref 70–99)

## 2012-02-22 LAB — PROCALCITONIN: Procalcitonin: 1.5 ng/mL

## 2012-02-22 MED ORDER — DEXTROSE 5 % IV SOLN
2.0000 g | INTRAVENOUS | Status: DC
  Administered 2012-02-23: 2 g via INTRAVENOUS
  Filled 2012-02-22 (×2): qty 2

## 2012-02-22 MED ORDER — VANCOMYCIN HCL 1000 MG IV SOLR: 750.0000 mg | Freq: Once | INTRAVENOUS | Status: DC

## 2012-02-22 MED ORDER — DEXTROSE 5 % IV SOLN: 1.0000 g | Freq: Once | INTRAVENOUS | Status: DC

## 2012-02-22 MED ORDER — HEPARIN SODIUM (PORCINE) 1000 UNIT/ML DIALYSIS: 20.0000 [IU]/kg | INTRAMUSCULAR | Status: DC | PRN

## 2012-02-22 MED ORDER — DARBEPOETIN ALFA-POLYSORBATE 60 MCG/0.3ML IJ SOLN
60.0000 ug | INTRAMUSCULAR | Status: DC
  Administered 2012-02-23: 60 ug via INTRAVENOUS

## 2012-02-22 MED ORDER — CLONIDINE HCL 0.2 MG/24HR TD PTWK
0.2000 mg | MEDICATED_PATCH | TRANSDERMAL | Status: DC
  Administered 2012-02-22: 0.2 mg via TRANSDERMAL
  Filled 2012-02-22: qty 1

## 2012-02-22 MED ORDER — INSULIN ASPART 100 UNIT/ML ~~LOC~~ SOLN: 0.0000 [IU] | Freq: Every day | SUBCUTANEOUS | Status: DC

## 2012-02-22 MED ORDER — LORAZEPAM 2 MG/ML IJ SOLN
0.5000 mg | Freq: Four times a day (QID) | INTRAMUSCULAR | Status: DC | PRN
  Administered 2012-02-22: 0.5 mg via INTRAVENOUS
  Administered 2012-02-23 (×2): 1 mg via INTRAVENOUS
  Filled 2012-02-22 (×3): qty 1

## 2012-02-22 MED ORDER — RENA-VITE PO TABS
1.0000 | ORAL_TABLET | Freq: Every day | ORAL | Status: DC
  Filled 2012-02-22: qty 1

## 2012-02-22 MED ORDER — INSULIN ASPART 100 UNIT/ML ~~LOC~~ SOLN
0.0000 [IU] | Freq: Three times a day (TID) | SUBCUTANEOUS | Status: DC
  Administered 2012-02-22: 15 [IU] via SUBCUTANEOUS
  Administered 2012-02-23: 11 [IU] via SUBCUTANEOUS

## 2012-02-22 MED ORDER — ONDANSETRON HCL 4 MG/2ML IJ SOLN
4.0000 mg | INTRAMUSCULAR | Status: DC | PRN
  Administered 2012-02-22 – 2012-02-23 (×2): 4 mg via INTRAVENOUS
  Filled 2012-02-22: qty 2

## 2012-02-22 MED ORDER — VANCOMYCIN HCL 1000 MG IV SOLR
1250.0000 mg | INTRAVENOUS | Status: AC
  Administered 2012-02-22: 1250 mg via INTRAVENOUS
  Filled 2012-02-22 (×2): qty 1250

## 2012-02-22 NOTE — Telephone Encounter (Signed)
Patient is already scheduled, no action, dpm-

## 2012-02-22 NOTE — Telephone Encounter (Signed)
Message copied by Fredrich Birks on Fri Feb 22, 2012  9:31 AM ------      Message from: Melene Plan      Created: Thu Feb 21, 2012  2:22 PM      Regarding: FW: charges                   ----- Message -----         From: Chuck Hint, MD         Sent: 02/21/2012   1:36 PM           To: Reuel Derby, Melene Plan, RN      Subject: charges                                                  PROCEDURE:       1. Superior venacavogram      2. Angioplasty of the fibrous sheath in the superior vena cava using 6 mm, 8 mm, and 10 mm balloon.      3. Placement of new 23 cm right IJ Diatek catheter            SURGEON: Di Kindle. Edilia Bo, MD, FACS            ASSIST: Leonides Sake M.D.            If this fails to function adequately the patient should have attempted angioplasty of the fiber sheath by interventional radiology. The patient is not a candidate for a femoral Diatek because of the aortobifemoral bypass graft. This patient should have a follow up appointment with Dr. Myra Gianotti who is following her groin wounds after aortofemoral bypass graft.

## 2012-02-22 NOTE — Progress Notes (Signed)
ANTIBIOTIC CONSULT NOTE - FOLLOW UP  Pharmacy Consult for Vancomycin Indication:  Infection to open wounds  Labs:  [facility] 02/22/12 1200 02/21/12 2301 02/21/12 0846  WBC 19.6* 14.6* --  HGB 8.9* 9.6* 10.5*  PLT 364 349 --  LABCREA -- -- --  CREATININE 4.75* 3.92* --    Microbiology:   Anti-infectives     Start     Dose/Rate Route Frequency Ordered Stop   02/23/12 1200   cefTAZidime (FORTAZ) 2 g in dextrose 5 % 50 mL IVPB        2 g 100 mL/hr over 30 Minutes Intravenous Every T-Th-Sa (Hemodialysis) 02/22/12 1603     02/22/12 1615   vancomycin (VANCOCIN) 750 mg in sodium chloride 0.9 % 150 mL IVPB  Status:  Discontinued        750 mg 150 mL/hr over 60 Minutes Intravenous  Once 02/22/12 1602 02/22/12 1620   02/22/12 1600   vancomycin (VANCOCIN) 750 mg in sodium chloride 0.9 % 150 mL IVPB  Status:  Discontinued        750 mg 150 mL/hr over 60 Minutes Intravenous  Once 02/22/12 1557 02/22/12 1602   02/22/12 1600   cefTAZidime (FORTAZ) 1 g in dextrose 5 % 50 mL IVPB  Status:  Discontinued        1 g 100 mL/hr over 30 Minutes Intravenous  Once 02/22/12 1557 02/22/12 1602   02/22/12 0945   vancomycin (VANCOCIN) 1,250 mg in sodium chloride 0.9 % 250 mL IVPB        1,250 mg 250 mL/hr over 60 Minutes Intravenous NOW 02/22/12 0942 02/22/12 1448   02/21/12 1400   vancomycin (VANCOCIN) 750 mg in sodium chloride 0.9 % 150 mL IVPB        750 mg 150 mL/hr over 60 Minutes Intravenous  Once 02/21/12 1356 02/21/12 1825   02/21/12 1400   cefTAZidime (FORTAZ) 2 g in dextrose 5 % 50 mL IVPB        2 g 100 mL/hr over 30 Minutes Intravenous  Once 02/21/12 1356 02/21/12 1913   02/20/12 1435   ceFAZolin (ANCEF) IVPB 2 g/50 mL premix  Status:  Discontinued        2 g 100 mL/hr over 30 Minutes Intravenous 60 min pre-op 02/20/12 1435 02/21/12 1356   02/20/12 1433   ceFAZolin (ANCEF) IVPB 1 g/50 mL premix  Status:  Discontinued        1 g 100 mL/hr over 30 Minutes Intravenous  Once 02/20/12  1433 02/20/12 1435         Assessment:   Prior to admission, she had been on Vancomycin 750 mg IV and Ceftazidime 2 grams IV after each hemodialysis session, Tues-Thurs-Sat.   Regimen ordered to continue, with HD 02/23/12, but Vancomycin 1250 mg IV was given this afternoon. Extra dose.  Received Vancomycin 750 mg @1725  on 02/21/12 at the end of dialysis session.  Discussed with Dr. Caryn Section.  May need to skip Vancomycin dose 02/23/12.  Goal of Therapy:  pre-dialysis Vancomycin levels 15-25 mcg/ml  Plan:    Will check pre-dialysis Vancomycin level on 02/23/12.   Further vancomycin dosing based on level.   Ceftazidime 2 grams IV after each HD to resume 02/23/12.   Antibiotics to continue thru 02/27/12 (will be 2 weeks).    Dennie Fetters, [place] Pager: [phone] 02/22/2012,6:38 PM

## 2012-02-22 NOTE — Progress Notes (Signed)
Pt with only HD cath in place will notifiy Renal md.

## 2012-02-22 NOTE — Care Management Note (Signed)
    Page 1 of 2   02/24/2012     5:23:55 PM   CARE MANAGEMENT NOTE 02/24/2012  Patient:  [name], [name]   Account Number:  [id number]  Date Initiated:  02/22/2012  Documentation initiated by:  Donn Pierini  Subjective/Objective Assessment:   Pt admitted for oversedation after surgical procedure on 02/21/12     Action/Plan:   PTA pt lived at home with family,   Anticipated DC Date:  02/22/2012   Anticipated DC Plan:  HOME W HOME HEALTH SERVICES      DC Planning Services  CM consult      [facility] Choice  Resumption Of Svcs/PTA Provider   Choice offered to / List presented to:             [facility] agency  [facility]   Status of service:  Completed, signed off Medicare Important Message given?   (If response is "NO", the following Medicare IM given date fields will be blank) Date Medicare IM given:   Date Additional Medicare IM given:    Discharge Disposition:  HOME W HOME HEALTH SERVICES  Per UR Regulation:  Reviewed for med. necessity/level of care/duration of stay  If discussed at Long Length of Stay Meetings, dates discussed:    Comments:  02/24/2012 1720 Faxed resumption of care orders to [facility]. Will verify with [facility] orders were received on 7/1.  Isidoro Donning RN CCM Case Mgmt phone [phone]  PCP- Ernestine Conrad  02/22/12- 1030- Donn Pierini RN, BSN [phone] UR completed, was notified by [facility] rep. Mary that pt is active with [facility] for HH-RN/PT- Pt will need resumption orders at discharge for [facility]- note left on chart for resumption orders. Pt also has an aide with [facility].

## 2012-02-22 NOTE — Progress Notes (Signed)
Inpatient Diabetes Program Recommendations  AACE/ADA: New Consensus Statement on Inpatient Glycemic Control (2009)  Target Ranges:  Prepandial:   less than 140 mg/dL      Peak postprandial:   less than 180 mg/dL (1-2 hours)      Critically ill patients:  140 - 180 mg/dL   Reason for Visit: Hyperglycemia  Results for [name], [name] (MRN [id number]) as of 02/22/2012 15:19  Ref. Range 02/21/2012 08:35 02/21/2012 10:39 02/21/2012 13:29 02/21/2012 21:35 02/22/2012 12:56  Glucose-Capillary Latest Range: 70-99 mg/dL 409 (H) 811 (H) 914 (H) 296 (H) 305 (H)    Inpatient Diabetes Program Recommendations Insulin - Meal Coverage: Add Novolog 8 units tidwc for meal coverage insulin  Note: Will follow.

## 2012-02-22 NOTE — Progress Notes (Signed)
Dr Caryn Section notified and we may access Temp Hd cath per IV therapy for fluids 1055

## 2012-02-22 NOTE — Consult Note (Addendum)
[facility]        RENAL CONSULT   Reason for Consult: End stage renal disease in patient admitted for post operative somnolence. Referring Physician: Dr. Reather Littler  [name] is a 41 y.o. white woman admitted yesterday following replacement of tunneled dialysis catheter +. SVavogram and angioplasty of fibrous sheath in SVC.  she was hard to arouse after the procedure and was admitted. She underwent dialysis yesterday evening.   Past Medical History  Diagnosis Date  . DVT (deep venous thrombosis)     DVT HISTORY  . Diabetes mellitus   . Migraine     HISTORY  . Anxiety and depression   . CAD (coronary artery disease)     Last cath 2009:  LAD stent 80% stenosis. Circumflex 60-70% stenosis AV groove, right coronary artery 50-60% stenosis. She did have cutting balloon angioplasty of the LAD lesion into a diagonal. However, there was restenosis of this and it was managed medically.  . Bipolar 1 disorder   . Hyperlipidemia   . Hyperparathyroidism   . PE (pulmonary embolism)     HISTORY  . Dialysis patient   . Anemia   . Renal failure     dialysis [place] t,th sat  . Nephrotic syndrome   . Peripheral vascular disease   . MRSA infection   . Anxiety   . Depression   . Gastroparesis   . Complication of anesthesia   . PONV (postoperative nausea and vomiting)     x3 days post anesth. on 12/19/2011  . Myocardial infarction     x3 , last one - 10 yrs. ago  . Hypertension     sees Dr. Antoine Poche - [place], last stress test 11/2011, see result in EPIC, saw Dr. Antoine Poche as well at that time    . Shortness of breath   . Recurrent upper respiratory infection (URI)     Bronchitis 11/2011 - saw Dr. Loney Hering, treating currently /w antibiotic   . GERD (gastroesophageal reflux disease)   . Pneumonia     [facility]- 2012    Her past medical history is significant as follows: ASCVD-A. Coronary artery disease    B. Peripheral vascular disease-aortobifem and right femoropopliteal bypass  graft May 2013 by Dr.                                  Myra Gianotti complicated by wound infections in the femoral areas left worse than right Diabetes mellitus-1   A. Retinopathy-S./P. laser photocoagulation and vitrectomy (Right)-Dr. Rankin  B. Neuropathy-sensory and autonomic (gastroparesis)  C. Nephropathy-on dialysis for 2 years End stage renal disease-on dialysis in [place] under the care of Dr. Eliane Decree Thursday Saturday dialysis S./P. cholecystectomy S./P. back surgery x3 (lumbar) DVT High BP  Family History  Problem Relation Age of Onset  . Transient ischemic attack Mother   . Hepatitis Sister   . Diabetes type II Other   . Anesthesia problems Neg Hx   . Hypotension Neg Hx   . Malignant hyperthermia Neg Hx   . Pseudochol deficiency Neg Hx    her father died age 31 of suicide she has 2 older sisters and one older brother who are healthy  Social History: [place] and she finished ninth grade. She does not work. She is currently on disability. She lives with her mother [name]) and sister [name]). She has 60 pack year history cigarette smoking (  smoke 3 packs per day). She does not drink alcohol. She has been married twice and divorced twice. She has no children  Allergies:  Allergies  Allergen Reactions  . Compazine (Prochlorperazine) Swelling    Per echart records  . Contrast Media (Iodinated Diagnostic Agents)     Per echart records  . Meperidine Hcl Swelling  . Metformin And Related Other (See Comments)    'just not myself, was told not to take metformin'  . Morphine Swelling  . Nubain (Nalbuphine Hcl) Swelling  . Topiramate (Topamax) Swelling    Tongue swelling - per echart records  . Toradol (Ketorolac Tromethamine) Swelling    Medications:  Abilify 2 mg/T., Sensipar 30/D., Plavix 75/D., Lantus insulin 30 units/D., NovoLog sliding scale, metoclopramide 10 mg 4 times a day by mouth, ofloxacin eyedrops right eye 3 times a day, Paxil 80/D.,  Duke Salvia 5 tablets 3 times a day with meals     ROS- no angina, no claudication, no melena, no hematochezia, no gross hematuria, no renal colic,  , no nocturia, no doe, occasional orthopnea, no purulent sputum, no hemoptysis, no weight gain or weight loss, no cold or heat intolerance. She has nausea and has been anorectic Blood pressure 163/74, pulse 107, temperature 97.5 F (36.4 C), temperature source Oral, resp. rate 11, height 5' 10.47" (1.79 m), weight 86.9 kg (191 lb 9.3 oz), last menstrual period 09/03/2008, SpO2 95.00%. Nose, mouth, pharynx-edentulous no inflammation or exudate  Neck-not stiff  Chest-right IJ tunnel catheter, crackles and rhonchi in left base  Heart-no rub is heard  Abdomen- nontender  Extremities- healing wound right femoral area, there appears to be a dehiscent of the wound in the left femoral area. 1+ edema is present there appears to be redness on the anterior shins bilaterally. There are nonfunctioning dialysis accesses in both forearms and left upper arm  Neuro-right-handed, decreased sensation in feet   Lab  Hemoglobin 8.9, WBC 19,600, platelet count 364,000 Sodium 134, potassium 4.2, chloride 95, CO2 23, BUN 38, creatinine 4.75, calcium 9.8, phosphorus 5.7, alkaline phosphatase 176, glucose 334  Chest x-ray shows decreased aeration in lung bases  Assessment/Plan: 1.Somnolence following placement of dialysis catheter-will observe 2 . Diabetes mellitus with       a. glucose intolerance,      B. retinopathy,       C. Neuropathy       D. nephropathy-per primary service, insulin and CBGs 3. S./P. aortobifem and right femoropopliteal bypass graft May 2013-per [facility], continue vancomycin and Fortaz (see below) 4. End-stage renal disease-continue 3 times a week dialysis (Tuesday Thursday Larita Fife, RN at [facility] dialysis reports she has been getting vancomycin 750 mg IV with each dialysis and fortaz 2 g IV each dialysis (to be continued  through 3-July 2013) other dialysis orders: 4'15",2K, 2.5 calcium bath, Venofer 100 mg/week EPO 8800 units each dialysis, EDW 80 kg.  Her weight today is 86.9 kg (well above the EDW) 5. High BP-continue current meds and follow 6.  Anemia--check Fe studies and continue EPO   Feleica Fulmore F 02/22/2012, 3:26 PM

## 2012-02-22 NOTE — Progress Notes (Signed)
No more nausea.  Cont to watch

## 2012-02-22 NOTE — Progress Notes (Signed)
[facility] [place] TEAM 1 - Stepdown/ICU TEAM  PCP:  Ernestine Conrad, MD  Subjective: 41 yo female with a complex medical hx who presented for outpt tunneled dialysis cath placement + a SVC arteriogram w/ angioplasty of a fibrous sheath in the SVC and was noted to be oversomnolent prior to surgery.  After surgery in recovery she was still overly somnolent. She is wheelchair bound at baseline.  She just recently had peripheral bypass surgery.  She is on multiple sedative meds at home and says she took her meds before coming for her procedure.  According to the Nephrology service, she frequently is over sedated when seen at outpt appts.   She is lethargic, but responds to direct questions.  She denies specific complaints.  Her sister and mother are at the bedside and confirm that she is often lethargic at home.  They further confirm that she often takes extra doses of her prescribed narcotics and benzos, either due to sx for possibly due to confusion.    Objective:  Intake/Output Summary (Last 24 hours) at 02/22/12 1627 Last data filed at 02/22/12 0823  Gross per 24 hour  Intake      0 ml  Output   3701 ml  Net  -3701 ml   Blood pressure 163/74, pulse 107, temperature 97.5 F (36.4 C), temperature source Oral, resp. rate 11, height 5' 10.47" (1.79 m), weight 86.9 kg (191 lb 9.3 oz), last menstrual period 09/03/2008, SpO2 95.00%.  CBG (last 3)   Basename 02/22/12 1256 02/21/12 2135 02/21/12 1329  GLUCAP 305* 296* 256*   Physical Exam: General: No acute respiratory distress - somnolent Lungs: Clear to auscultation bilaterally without wheezes or crackles Cardiovascular: Regular rate and rhythm without murmur gallop or rub normal S1 and S2 Abdomen: obese, nontender, nondistended, soft, bowel sounds positive, no rebound, no ascites, no appreciable mass Extremities: No significant cyanosis, clubbing, or edema bilateral lower extremities - some pretibial erythema of the L LE  Lab  Results:  Basename 02/22/12 1200 02/21/12 2301 02/21/12 0846  NA 134* 136 130*  K 4.2 4.5 4.2  CL 95* 97 --  CO2 23 21 --  GLUCOSE 334* 320* 217*  BUN 38* 32* --  CREATININE 4.75* 3.92* --  CALCIUM 9.8 9.4 --  MG -- 2.4 --  PHOS -- 5.7* --    Basename 02/21/12 2301  AST 11  ALT 9  ALKPHOS 176*  BILITOT 0.1*  PROT 6.6  ALBUMIN 2.2*    Basename 02/22/12 1200 02/21/12 2301 02/21/12 0846  WBC 19.6* 14.6* --  NEUTROABS -- 13.0* --  HGB 8.9* 9.6* 10.5*  HCT 29.0* 30.3* 31.0*  MCV 98.3 96.2 --  PLT 364 349 --   Micro Results: Recent Results (from the past 240 hour(s))  MRSA PCR SCREENING     Status: Normal   Collection Time   02/14/12 12:42 PM      Component Value Range Status Comment   MRSA by PCR NEGATIVE  NEGATIVE Final   URINE CULTURE     Status: Normal   Collection Time   02/14/12  1:20 PM      Component Value Range Status Comment   Specimen Description URINE, CATHETERIZED   Final    Special Requests NONE   Final    Culture  Setup Time [id number]   Final    Colony Count NO GROWTH   Final    Culture NO GROWTH   Final    Report Status 02/15/2012 FINAL  Final   WOUND CULTURE     Status: Normal   Collection Time   02/14/12  1:20 PM      Component Value Range Status Comment   Specimen Description WOUND GROIN LEFT   Final    Special Requests NONE   Final    Gram Stain     Final    Value: NO WBC SEEN     NO SQUAMOUS EPITHELIAL CELLS SEEN     NO ORGANISMS SEEN   Culture     Final    Value: RARE METHICILLIN RESISTANT STAPHYLOCOCCUS AUREUS     Note: RIFAMPIN AND GENTAMICIN SHOULD NOT BE USED AS SINGLE DRUGS FOR TREATMENT OF STAPH INFECTIONS. This organism DOES NOT demonstrate inducible Clindamycin resistance in vitro.   Report Status 02/18/2012 FINAL   Final    Organism ID, Bacteria METHICILLIN RESISTANT STAPHYLOCOCCUS AUREUS   Final   WOUND CULTURE     Status: Normal   Collection Time   02/14/12  1:20 PM      Component Value Range Status Comment   Specimen  Description WOUND GROIN RIGHT   Final    Special Requests NONE   Final    Gram Stain     Final    Value: NO WBC SEEN     NO SQUAMOUS EPITHELIAL CELLS SEEN     NO ORGANISMS SEEN   Culture FEW PSEUDOMONAS AERUGINOSA   Final    Report Status 02/18/2012 FINAL   Final    Organism ID, Bacteria PSEUDOMONAS AERUGINOSA   Final   CULTURE, BLOOD (ROUTINE X 2)     Status: Normal   Collection Time   02/14/12  2:08 PM      Component Value Range Status Comment   Specimen Description BLOOD RIGHT FOREARM   Final    Special Requests BOTTLES DRAWN AEROBIC ONLY 5CC   Final    Culture  Setup Time [id number]   Final    Culture NO GROWTH 5 DAYS   Final    Report Status 02/20/2012 FINAL   Final   CULTURE, BLOOD (ROUTINE X 2)     Status: Normal   Collection Time   02/14/12  2:18 PM      Component Value Range Status Comment   Specimen Description BLOOD LEFT UPPER ARM   Final    Special Requests BOTTLES DRAWN AEROBIC ONLY [facility]   Final    Culture  Setup Time [id number]   Final    Culture NO GROWTH 5 DAYS   Final    Report Status 02/20/2012 FINAL   Final   CATH TIP CULTURE     Status: Normal   Collection Time   02/15/12  2:44 PM      Component Value Range Status Comment   Specimen Description CATH TIP   Final    Special Requests DIALYSIS CATHETER TIP FOR ROUTINE CULTURE   Final    Culture NO GROWTH 2 DAYS   Final    Report Status 02/18/2012 FINAL   Final   MRSA PCR SCREENING     Status: Normal   Collection Time   02/21/12 10:19 PM      Component Value Range Status Comment   MRSA by PCR NEGATIVE  NEGATIVE Final     Studies/Results: All recent x-ray/radiology reports have been reviewed in detail.   Medications: I have reviewed the patient's complete medication list.  Assessment/Plan:  Altered mental status / lethargy Due to chronic overuse/abuse of benzos and narcotics - discussed need  to taper/wean to d/c with pt and her family - family agrees this is clearly needed - pt does not acknowledge this  - change meds to IV until more alert/able to take oral meds  LLE cellulitis Not convincing to my exam - cont empiric abx for now and follow  B LE ulcerations/groin wounds WOC RN consultation for wound care  PVD s/p aorto-bifem and R fem-pop May 2013 Complicated by wound infections - to remain on vanc and fortaz (MRSA and Pseudomonas noted in cxs, though in very limited quantity)  ESRD on HD on T,Th,Sat schedule Nephrology is following  Hx of DVT w/ PE Cont prophy dosing  CAD Asymptomatic  Bipolar D/O Likely driving some of her substance overuse - follow w/ titration of meds  Chronic anemia Most likely due to CKD - follow trend  DM Poorly controlled - adjust meds and follow trend  HTN Poorly controlled - adjust tx and follow  Lonia Blood, MD [facility] Office  [phone] Pager [phone]  On-Call/Text Page:      [email or web address]      password [facility]

## 2012-02-22 NOTE — Progress Notes (Signed)
Pt with c/o nausea and vomitted about 200cc's  green bile color secretions.  Refused medications due to nausea. Md will be notified. Pt still drowsy but oriented x 3.

## 2012-02-23 DIAGNOSIS — L03119 Cellulitis of unspecified part of limb: Secondary | ICD-10-CM

## 2012-02-23 DIAGNOSIS — L02419 Cutaneous abscess of limb, unspecified: Secondary | ICD-10-CM

## 2012-02-23 DIAGNOSIS — T50995A Adverse effect of other drugs, medicaments and biological substances, initial encounter: Secondary | ICD-10-CM

## 2012-02-23 DIAGNOSIS — F1310 Sedative, hypnotic or anxiolytic abuse, uncomplicated: Secondary | ICD-10-CM

## 2012-02-23 DIAGNOSIS — N186 End stage renal disease: Secondary | ICD-10-CM

## 2012-02-23 LAB — CBC
HCT: 27.2 % — ABNORMAL LOW (ref 36.0–46.0)
Hemoglobin: 8.3 g/dL — ABNORMAL LOW (ref 12.0–15.0)
RBC: 2.75 MIL/uL — ABNORMAL LOW (ref 3.87–5.11)
WBC: 14.6 10*3/uL — ABNORMAL HIGH (ref 4.0–10.5)

## 2012-02-23 LAB — RENAL FUNCTION PANEL
BUN: 42 mg/dL — ABNORMAL HIGH (ref 6–23)
Chloride: 96 mEq/L (ref 96–112)
GFR calc Af Amer: 10 mL/min — ABNORMAL LOW (ref >90)
Glucose, Bld: 273 mg/dL — ABNORMAL HIGH (ref 70–99)
Phosphorus: 6.9 mg/dL — ABNORMAL HIGH (ref 2.3–4.6)
Potassium: 4.1 mEq/L (ref 3.5–5.1)
Sodium: 136 mEq/L (ref 135–145)

## 2012-02-23 LAB — GLUCOSE, CAPILLARY
Glucose-Capillary: 263 mg/dL — ABNORMAL HIGH (ref 70–99)
Glucose-Capillary: 259 mg/dL — ABNORMAL HIGH (ref 70–99)

## 2012-02-23 LAB — IRON AND TIBC
Saturation Ratios: 20 % (ref 20–55)
UIBC: 171 ug/dL (ref 125–400)

## 2012-02-23 LAB — FERRITIN: Ferritin: 905 ng/mL — ABNORMAL HIGH (ref 10–291)

## 2012-02-23 MED ORDER — SODIUM CHLORIDE 0.9 % IJ SOLN
INTRAMUSCULAR | Status: AC
  Filled 2012-02-23: qty 10

## 2012-02-23 MED ORDER — DARBEPOETIN ALFA-POLYSORBATE 60 MCG/0.3ML IJ SOLN: 60.0000 ug | INTRAMUSCULAR | Status: DC

## 2012-02-23 MED ORDER — DARBEPOETIN ALFA-POLYSORBATE 60 MCG/0.3ML IJ SOLN
INTRAMUSCULAR | Status: AC
  Administered 2012-02-23: 60 ug via INTRAVENOUS
  Filled 2012-02-23: qty 0.3

## 2012-02-23 NOTE — Progress Notes (Signed)
ANTIBIOTIC CONSULT NOTE - FOLLOW UP  Pharmacy Consult for vancomycin Indication: Infection to open wounds  Allergies  Allergen Reactions  . Compazine (Prochlorperazine) Swelling    Per echart records  . Contrast Media (Iodinated Diagnostic Agents)     Per echart records  . Meperidine Hcl Swelling  . Metformin And Related Other (See Comments)    'just not myself, was told not to take metformin'  . Morphine Swelling  . Nubain (Nalbuphine Hcl) Swelling  . Topiramate (Topamax) Swelling    Tongue swelling - per echart records  . Toradol (Ketorolac Tromethamine) Swelling    Patient Measurements: Height: 5' 10.47" (179 cm) Weight: 188 lb 15 oz (85.7 kg) IBW/kg (Calculated) : 69.59   Vital Signs: Temp: 96.8 F (36 C) (06/29 0920) Temp src: Oral (06/29 0920) BP: 131/79 mmHg (06/29 1130) Pulse Rate: 111  (06/29 1130) Intake/Output from previous day: 06/28 0701 - 06/29 0700 In: 175 [P.O.:175] Out: 1 [Urine:1] Intake/Output from this shift:    Labs:  Basename 02/23/12 1024 02/22/12 1200 02/21/12 2301  WBC 14.6* 19.6* 14.6*  HGB 8.3* 8.9* 9.6*  PLT 330 364 349  LABCREA -- -- --  CREATININE 5.46* 4.75* 3.92*   Estimated Creatinine Clearance: 16.1 ml/min (by C-G formula based on Cr of 5.46).  Basename 02/23/12 1130  VANCOTROUGH --  Leodis Binet --  VANCORANDOM 45.0  GENTTROUGH --  GENTPEAK --  GENTRANDOM --  TOBRATROUGH --  TOBRAPEAK --  TOBRARND --  AMIKACINPEAK --  AMIKACINTROU --  AMIKACIN --     Assessment: 41 yo female with LLE cellulitis on vancomycin prior to admission and to continue until 02/27/12. Per records on 6/20 patient was on vancomycin 750mg  with HD. Patient received 1250mg  vancomycin on 6/28 and 750mg  on 6/27. Vancomycin level pre-HD was 45.   Goal of Therapy:  Pre-dialysis Vancomycin levels 15-25 mcg/ml   Plan:  -Will skip the today's vancomycin (due today 02/23/12) -Next vancomycin dose due on 7/2: could consider a 500mg  dose   Harland German,  Pharm D 02/23/2012 1:05 PM

## 2012-02-23 NOTE — Discharge Instructions (Signed)
YOU SHOULD HAVE YOUR FAMILY OVERSEE THE DOSING OF ALL OF YOUR MEDICATIONS - YOU ARE ON A NUMBER OF SEDATING MEDICATIONS, AND EXTRA OR INAPPROPRIATE DOSES OF THESE MEDICATIONS CAN LEAD TO SEVERE SEDATION AND EVEN DEATH - THIS CAN BE MADE WORSE BY THE FACT THAT YOU ARE A DIALYSIS PATIENT - IT IS HIGHLY RECOMMENDED THAT YOU ALLOW A FAMILY MEMBER TO ADMINISTER YOUR MEDICATIONS TO HELP AVOID THIS PROBLEM   Polypharmacy Problems Polypharmacy problems can occur when you take more than one medicine. Your caregivers need to know about all the medicines you take. This includes vitamins, herbs, eyedrops, over-the-counter medicines, prescription medicines, and creams. Drug interaction problems can include:  Bad reactions or side effects. This can occur when certain drugs are taken together.   Reduced benefit from your medicines. This can occur if one drug reduces the ability of another drug to work.  Some drug interaction problems can be life-threatening. Your caregivers can coordinate a plan to help you avoid polypharmacy problems. RISK FACTORS Polypharmacy problems are more likely to occur if:  You have many caregivers prescribing different medicines for you. This is a common problem for elderly patients.   You have a long-term (chronic) medical condition.   You have had an organ transplant.   You have human immunodeficiency virus (HIV).   You are undergoing chemotherapy.   You have hepatitis.   You have kidney disease or kidney failure.   You have significant heart disease or lung disease.   You are elderly.   You are taking medicines that have a low margin of error. This means there is little difference between the right amount of medicine and too much medicine. Medicines in this category include:   Warfarin.   Digoxin.   Lithium.   Theophylline.   Monoamine oxidase (MAO) inhibitors.   Seizure medicines.   Cyclosporine.  PREVENTION   Choose one caregiver to be in charge of  coordinating your medicines. Inform this caregiver about all medicines and supplements you take, even if they are prescribed by another caregiver.   Purchase all your medicines through the same pharmacy. The pharmacy keeps track of your medicines and possible drug interactions. They can notify your caregiver of potential problems.   Read the information given to you at your pharmacy.   Carry a list of all your medicines and their doses with you. This informs your caregivers, emergency department caregivers, and specialists about the medicines you are taking. This is especially important before you start a new medicine.   Use a system to keep track of which medicines you are taking and when. Many patients use a pillbox that separates medicines by the day and time they are supposed to be taken.   Carry a list of your chronic medical problems with you. Conditions such as kidney problems, liver problems, organ transplants, and chronic viral infections affect the way your body handles medicines.   Ask your caregiver or pharmacist if you have any questions about your medicines.  SEEK MEDICAL CARE IF:  You have any problems that may be caused by your medicines.   You have chest pain.   You have shortness of breath.   You have an irregular heartbeat.   You have fainting spells.   You have shaking or tremors.   You have weakness or tiredness (lethargy).   You have a rash or swelling in any part of the body.   You have increased bleeding, rectal bleeding, vaginal bleeding, or you bruise easily.  You have abdominal pain.   You have nausea.   You have vomiting.  Document Released: 09/20/2004 Document Revised: 08/02/2011 Document Reviewed: 05/02/2011 [facility] Patient Information 2012 [place], [place].

## 2012-02-23 NOTE — Discharge Summary (Signed)
DISCHARGE SUMMARY  [name]  MR#: [id number]  DOB:1971-03-26  Date of Admission: 02/21/2012 Date of Discharge: 02/23/2012  Attending Physician:Sinthia Karabin T  Patient's WUJ:WJXBJ, Lyda Perone, MD  Consults: Nephrology  Disposition: D/C Home  Follow-up Appts: Discharge Orders    Future Appointments: Provider: Department: Dept Phone: Center:   02/25/2012 1:30 PM Nada Libman, MD [facility] [phone] [facility]     Future Orders Please Complete By Expires   Resume previous diet      Increase activity slowly        Tests Needing Follow-up: Pt should be assessed for oversedation due to inappropriate/excessive dosing of narcotics and benzos.  The pt should be given only limited amounts of any potentially sedating meds, and should continue to be encouraged to allow her family to supervise the administration of these meds.  Discharge Diagnoses: Present on Admission:  .Sedated due to multiple medications .HYPERTENSION .Ulcers of both lower extremities - chronic .ESRD on dialysis .DM (diabetes mellitus), type 2 with complications .Atherosclerosis of native arteries of the extremities with intermittent claudication  Initial presentation: 41 yo female with a complex medical hx who presented for outpt tunneled dialysis cath placement + a SVC arteriogram w/ angioplasty of a fibrous sheath in the SVC and was noted to be oversomnolent prior to surgery. After surgery in recovery she was still overly somnolent. She is wheelchair bound at baseline. She just recently had peripheral bypass surgery. She is on multiple sedative meds at home and says she took her meds before coming for her procedure. According to the Nephrology service, she frequently is over sedated when seen at outpt appts.   Hospital Course:  Altered mental status / lethargy  Due to chronic overuse/abuse of benzos and narcotics - discussed need to taper/wean to d/c with pt and her family - family agrees this is clearly  needed - pt does not acknowledge this - pt improved rapidly when sedating meds were withheld - I have warned her again on the potential for accidental death by overdose of her sedating meds, and have again advised her to allow her family to administer any pain meds or benzodiazepines - furthermore, I would encourage her primary care MD to consider weaning these meds to off in an appropriate fashion  LLE cellulitis  Not convincing to my exam - cont empiric abx as previously dosed   B LE ulcerations/groin wounds  WOC RN saw for wound care f/u - is scheduled to f/u w/ Dr. Myra Gianotti in 48hrs  PVD s/p aorto-bifem and R fem-pop May 2013  Complicated by wound infections - to remain on vanc and fortaz (MRSA and Pseudomonas noted in cxs, though in very limited quantity) which are being dosed during HD  ESRD on HD on T,Th,Sat schedule  Nephrology has cleared for d/c - to resume usual outpt HD schedule  Hx of DVT w/ PE  No acute complications during this admit   CAD  Asymptomatic   Bipolar D/O  Appears to be stable at present    Chronic anemia  Most likely due to CKD - Hgb stable at 8-9 during hospital stay  DM  No acute complications during this stay - resume home regimen   HTN  No acute complications during this stay - resume home regimen  Medication List  As of 02/23/2012  5:56 PM   STOP taking these medications         butalbital-acetaminophen-caffeine 50-325-40 MG per tablet      methocarbamol 500 MG tablet  metoCLOPramide 10 MG tablet      oxyCODONE-acetaminophen 10-325 MG per tablet      promethazine 25 MG tablet      traZODone 100 MG tablet      zolpidem 5 MG tablet         TAKE these medications         albuterol 108 (90 BASE) MCG/ACT inhaler   Commonly known as: PROVENTIL HFA;VENTOLIN HFA   Inhale 2 puffs into the lungs every 6 (six) hours as needed. Shortness of breath      ALPRAZolam 1 MG tablet   Commonly known as: XANAX   Take 1 mg by mouth 3 (three)  times daily as needed. For anxiety      ARIPiprazole 2 MG tablet   Commonly known as: ABILIFY   Take 2 mg by mouth daily.      b complex-vitamin c-folic acid 0.8 MG Tabs   Take 0.8 mg by mouth at bedtime.      cinacalcet 30 MG tablet   Commonly known as: SENSIPAR   Take 30 mg by mouth daily with supper.      clopidogrel 75 MG tablet   Commonly known as: PLAVIX   Take 75 mg by mouth daily. Last dose 12/17/2011      darbepoetin 60 MCG/0.3ML Soln   Commonly known as: ARANESP   Inject 0.3 mLs (60 mcg total) into the vein every Saturday with hemodialysis.      hydroxypropyl methylcellulose 2.5 % ophthalmic solution   Commonly known as: ISOPTO TEARS   Place 1 drop into both eyes 3 (three) times daily as needed. Dry eyes      insulin glargine 100 UNIT/ML injection   Commonly known as: LANTUS   Inject 30 Units into the skin at bedtime.      insulin lispro 100 UNIT/ML injection   Commonly known as: HUMALOG   Inject 7-15 Units into the skin 4 (four) times daily -  before meals and at bedtime. TAKING ON SLIDING SCALE  200-249=7 units  250-299=10 units  300-349=12 units  >350=15 units      ofloxacin 0.3 % ophthalmic solution   Commonly known as: OCUFLOX   Place 1 drop into the right eye 3 (three) times daily.      omeprazole 40 MG capsule   Commonly known as: PRILOSEC   Take 40 mg by mouth daily.      oxyCODONE-acetaminophen 5-325 MG per tablet   Commonly known as: PERCOCET   Take 1-2 tablets by mouth every 4 (four) hours as needed for pain.      PARoxetine 40 MG tablet   Commonly known as: PAXIL   Take 80 mg by mouth every morning.      pyridoxine 100 MG tablet   Commonly known as: B-6   Take 100 mg by mouth daily.      sevelamer 800 MG tablet   Commonly known as: RENVELA   Take 1,600-4,000 mg by mouth 5 (five) times daily. *Take 5 tablets daily with each meal and take 2 tablets with snacks**      SYSTANE LID WIPES Pads   Place 1 application into the right eye  daily.      temazepam 30 MG capsule   Commonly known as: RESTORIL   Take 30 mg by mouth at bedtime.          **THE PATIENT IS TO COMPLETE HER PREVIOUSLY ARRANGED OUTPT ANTIBIOTIC COURSE AS IS BEING ADMINISTERED AT [facility]**  Day of Discharge BP 155/76  Pulse 103  Temp 97.9 F (36.6 C) (Oral)  Resp 11  Ht 5' 10.47" (1.79 m)  Wt 84.3 kg (185 lb 13.6 oz)  BMI 26.31 kg/m2  SpO2 96%  LMP 09/03/2008  Physical Exam: General: No acute respiratory distress - awake and alert at time of d/c  Lungs: Clear to auscultation bilaterally without wheezes or crackles Cardiovascular: Regular rate and rhythm without murmur gallop or rub normal S1 and S2 Abdomen: Nontender, nondistended, soft, bowel sounds positive, no rebound, no ascites, no appreciable mass Extremities: No significant cyanosis, clubbing, or edema bilateral lower extremities  CBC     Status: Abnormal   Collection Time   02/23/12 10:24 AM      Component Value Range   WBC 14.6 (*) 4.0 - 10.5 K/uL   RBC 2.75 (*) 3.87 - 5.11 MIL/uL   Hemoglobin 8.3 (*) 12.0 - 15.0 g/dL   HCT 63.8 (*) 75.6 - 43.3 %   MCV 98.9  78.0 - 100.0 fL   MCH 30.2  26.0 - 34.0 pg   MCHC 30.5  30.0 - 36.0 g/dL   RDW 29.5 (*) 18.8 - 41.6 %   Platelets 330  150 - 400 K/uL  RENAL FUNCTION PANEL     Status: Abnormal   Collection Time   02/23/12 10:24 AM      Component Value Range   Sodium 136  135 - 145 mEq/L   Potassium 4.1  3.5 - 5.1 mEq/L   Chloride 96  96 - 112 mEq/L   CO2 20  19 - 32 mEq/L   Glucose, Bld 273 (*) 70 - 99 mg/dL   BUN 42 (*) 6 - 23 mg/dL   Creatinine, Ser 6.06 (*) 0.50 - 1.10 mg/dL   Calcium 9.7  8.4 - 30.1 mg/dL   Phosphorus 6.9 (*) 2.3 - 4.6 mg/dL   Albumin 2.2 (*) 3.5 - 5.2 g/dL   GFR calc non Af Amer 9 (*) >90 mL/min   GFR calc Af Amer 10 (*) >90 mL/min  IRON AND TIBC     Status: Abnormal   Collection Time   02/23/12 11:30 AM      Component Value Range   Iron 42  42 - 135 ug/dL   TIBC 601 (*) 093 - 235 ug/dL    Saturation Ratios 20  20 - 55 %   UIBC 171  125 - 400 ug/dL  VANCOMYCIN, RANDOM     Status: Normal   Collection Time   02/23/12 11:30 AM      Component Value Range   Vancomycin Rm 45.0     Follow-up Information    Follow up with Jorge Ny, MD. (keep your previously scheduled appointment )    Contact information:   [address] [place] [address] [phone]         Time spent in discharge (includes decision making & examination of pt): 30 minutes  02/23/2012, 5:56 PM   Lonia Blood, MD [facility] Office  [phone] Pager [phone]  On-Call/Text Page:      [email or web address]      password [facility]

## 2012-02-23 NOTE — Progress Notes (Signed)
Patient d/c'd home per MD order. Patient and family given d/c instructions and all questions answered. Patient d/c'd via wheelchair with RN and family.

## 2012-02-23 NOTE — Procedures (Signed)
Currently on HD via newly placed R IJ tunneled cath BP130/75, goal 5 L Hgb 8.3, K pending (on 2 K bath) She has been getting Vanco and fortaz at her outpatient  Dialysis center (and is to continue through 3 July according to the nurse at [facility] dialysis center.  Ok with me for her to be d/c'd after dialysis to F/U with [facility] for her wounds from Aorto-bifem and FPBG in May.

## 2012-02-24 NOTE — Care Management (Signed)
Pt is also scheduled to have HH PT with [facility]. Isidoro Donning RN CCM Case Mgmt phone [phone]

## 2012-02-25 ENCOUNTER — Ambulatory Visit: Payer: Medicare Other | Admitting: Surgery

## 2012-02-25 ENCOUNTER — Other Ambulatory Visit (HOSPITAL_COMMUNITY): Payer: Self-pay | Admitting: Physician Assistant

## 2012-03-03 LAB — DRUG SCREEN PANEL (SERUM): Barbiturate Scrn: UNDETERMINED

## 2012-03-06 ENCOUNTER — Encounter (HOSPITAL_COMMUNITY): Payer: Self-pay | Admitting: *Deleted

## 2012-03-06 ENCOUNTER — Emergency Department (HOSPITAL_COMMUNITY): Payer: Medicare Other

## 2012-03-06 ENCOUNTER — Emergency Department (HOSPITAL_COMMUNITY)
Admission: EM | Admit: 2012-03-06 | Discharge: 2012-03-06 | Discharge status: Against Medical Advice | Payer: Medicare Other | Attending: Emergency Medicine | Admitting: Emergency Medicine

## 2012-03-06 DIAGNOSIS — E119 Type 2 diabetes mellitus without complications: Secondary | ICD-10-CM | POA: Insufficient documentation

## 2012-03-06 DIAGNOSIS — F319 Bipolar disorder, unspecified: Secondary | ICD-10-CM | POA: Insufficient documentation

## 2012-03-06 DIAGNOSIS — Z86711 Personal history of pulmonary embolism: Secondary | ICD-10-CM | POA: Insufficient documentation

## 2012-03-06 DIAGNOSIS — D649 Anemia, unspecified: Secondary | ICD-10-CM | POA: Insufficient documentation

## 2012-03-06 DIAGNOSIS — Z8614 Personal history of Methicillin resistant Staphylococcus aureus infection: Secondary | ICD-10-CM | POA: Insufficient documentation

## 2012-03-06 DIAGNOSIS — F17200 Nicotine dependence, unspecified, uncomplicated: Secondary | ICD-10-CM | POA: Insufficient documentation

## 2012-03-06 DIAGNOSIS — Z992 Dependence on renal dialysis: Secondary | ICD-10-CM | POA: Insufficient documentation

## 2012-03-06 DIAGNOSIS — Z79899 Other long term (current) drug therapy: Secondary | ICD-10-CM | POA: Insufficient documentation

## 2012-03-06 DIAGNOSIS — R079 Chest pain, unspecified: Secondary | ICD-10-CM | POA: Insufficient documentation

## 2012-03-06 DIAGNOSIS — Z86718 Personal history of other venous thrombosis and embolism: Secondary | ICD-10-CM | POA: Insufficient documentation

## 2012-03-06 DIAGNOSIS — K219 Gastro-esophageal reflux disease without esophagitis: Secondary | ICD-10-CM | POA: Insufficient documentation

## 2012-03-06 DIAGNOSIS — Z794 Long term (current) use of insulin: Secondary | ICD-10-CM | POA: Insufficient documentation

## 2012-03-06 DIAGNOSIS — I120 Hypertensive chronic kidney disease with stage 5 chronic kidney disease or end stage renal disease: Secondary | ICD-10-CM | POA: Insufficient documentation

## 2012-03-06 DIAGNOSIS — E785 Hyperlipidemia, unspecified: Secondary | ICD-10-CM | POA: Insufficient documentation

## 2012-03-06 DIAGNOSIS — N186 End stage renal disease: Secondary | ICD-10-CM | POA: Insufficient documentation

## 2012-03-06 DIAGNOSIS — I2510 Atherosclerotic heart disease of native coronary artery without angina pectoris: Secondary | ICD-10-CM | POA: Insufficient documentation

## 2012-03-06 DIAGNOSIS — I252 Old myocardial infarction: Secondary | ICD-10-CM | POA: Insufficient documentation

## 2012-03-06 LAB — CBC WITH DIFFERENTIAL/PLATELET
Basophils Absolute: 0 10*3/uL (ref 0.0–0.1)
Lymphocytes Relative: 7 % — ABNORMAL LOW (ref 12–46)
Lymphs Abs: 0.8 10*3/uL (ref 0.7–4.0)
MCV: 101.1 fL — ABNORMAL HIGH (ref 78.0–100.0)
Neutro Abs: 10.7 10*3/uL — ABNORMAL HIGH (ref 1.7–7.7)
Platelets: 311 10*3/uL (ref 150–400)
RBC: 2.81 MIL/uL — ABNORMAL LOW (ref 3.87–5.11)
RDW: 21.2 % — ABNORMAL HIGH (ref 11.5–15.5)
WBC: 12.4 10*3/uL — ABNORMAL HIGH (ref 4.0–10.5)

## 2012-03-06 LAB — BASIC METABOLIC PANEL
CO2: 18 mEq/L — ABNORMAL LOW (ref 19–32)
Chloride: 91 mEq/L — ABNORMAL LOW (ref 96–112)
GFR calc Af Amer: 6 mL/min — ABNORMAL LOW (ref >90)
Potassium: 3.7 mEq/L (ref 3.5–5.1)
Sodium: 130 mEq/L — ABNORMAL LOW (ref 135–145)

## 2012-03-06 LAB — TROPONIN I: Troponin I: <0.3 ng/mL (ref <0.30)

## 2012-03-06 NOTE — ED Notes (Signed)
Pt left AMA with family to go to dialysis for her treatment. Pt and family understand risks of signing out AMA up to and including death.

## 2012-03-06 NOTE — ED Notes (Signed)
Pt refuses to be admitted and is signing out AMA.

## 2012-03-06 NOTE — ED Notes (Signed)
Pt showing NSR on cardiac monitor. Pt alert and oriented x 3. Skin warm and dry. Color pink. Pt has pitting edema in all extremities. Family at bedside.

## 2012-03-06 NOTE — ED Provider Notes (Signed)
History  This chart was scribed for [employer]. [name] Payor, MD by [name]. This patient was seen in room APA06/APA06 and the patient's care was started at 9:14AM.  CSN: [id number]  Arrival date & time 03/06/12  0910   First MD Initiated Contact with Patient 03/06/12 0914      Chief Complaint  Patient presents with  . Chest Pain    The history is provided by the patient. No language interpreter was used.    [name] is a 41 y.o. female on hemodialysis who presents to the Emergency Department complaining of approximately 6 hours of gradual onset, gradually worsening, constant chest pain with associated SOB. The chest pain is substernal, non-radiating and described as sharp. The pain is worse with deep breathing. She denies taking OTC medications at home to improve symptoms. She denies having prior episodes of similar symptoms. She is on hemodialysis and was told to come to the ED because of the chest pain when she arrived at her diaylsis appointment this morning. She has a h/o DVT and PE but denies having known one currently. She also c/o allergy symptoms including a non-productive cough, nasal congestion, diarrhea and rhinorrhea. She denies fever, neck pain, sore throat, visual disturbance, abdominal pain, nausea, emesis, urinary symptoms, back pain, HA, weakness, numbness and rash as associated symptoms.  She also has a h/o DM, CAD, MI and HTN. She is a current everyday smoker but denies alcohol use.   PCP is Dr. Loney Hering.  Past Medical History  Diagnosis Date  . DVT (deep venous thrombosis)     DVT HISTORY  . Diabetes mellitus   . Migraine     HISTORY  . Anxiety and depression   . CAD (coronary artery disease)     Last cath 2009:  LAD stent 80% stenosis. Circumflex 60-70% stenosis AV groove, right coronary artery 50-60% stenosis. She did have cutting balloon angioplasty of the LAD lesion into a diagonal. However, there was restenosis of this and it was managed medically.  .  Bipolar 1 disorder   . Hyperlipidemia   . Hyperparathyroidism   . PE (pulmonary embolism)     HISTORY  . Dialysis patient   . Anemia   . Renal failure     dialysis [place] t,th sat  . Nephrotic syndrome   . Peripheral vascular disease   . MRSA infection   . Anxiety   . Depression   . Gastroparesis   . Complication of anesthesia   . PONV (postoperative nausea and vomiting)     x3 days post anesth. on 12/19/2011  . Myocardial infarction     x3 , last one - 10 yrs. ago  . Hypertension     sees Dr. Antoine Poche - [place], last stress test 11/2011, see result in EPIC, saw Dr. Antoine Poche as well at that time    . Shortness of breath   . Recurrent upper respiratory infection (URI)     Bronchitis 11/2011 - saw Dr. Loney Hering, treating currently /w antibiotic   . GERD (gastroesophageal reflux disease)   . Pneumonia     [facility]- 2012    Past Surgical History  Procedure Date  . Incise and drain abcess     OF THIGHS FROM INSULIN INJECTIONS  . Heart stents   . Portacath placement 2003  . Cataract extraction w/phaco 11/09/2011    Procedure: CATARACT EXTRACTION PHACO AND INTRAOCULAR LENS PLACEMENT (IOC);  Surgeon: Edmon Crape, MD;  Location: [facility] OR;  Service: Ophthalmology;  Laterality: Right;  .  Pars plana vitrectomy 11/09/2011    Procedure: PARS PLANA VITRECTOMY WITH 23 GAUGE;  Surgeon: Edmon Crape, MD;  Location: [facility] OR;  Service: Ophthalmology;  Laterality: Right;  Ahmed Valve; Scleral Reinforcement Graft  . Dg av dialysis  shunt access exist*l* or 12/13/11    left arm  . Dialysis cath inserted & portacath      dialysis catheter inserted & portacath d/c'd- 12/19/2011  . Back surgery X 4    x4 times   . Cholecystectomy   . Eye surgery   . Cardiac catheterization   . Aorta - bilateral femoral artery bypass graft 12/28/2011    Procedure: AORTA BIFEMORAL BYPASS GRAFT;  Surgeon: Nada Libman, MD;  Location: [facility] OR;  Service: Vascular;  Laterality: N/A;  AortoBifemoral Bypass using hemasheild 14x7 graft    . Femoral-popliteal bypass graft 12/28/2011    Procedure: BYPASS GRAFT FEMORAL-POPLITEAL ARTERY;  Surgeon: Nada Libman, MD;  Location: [facility] OR;  Service: Vascular;  Laterality: Right;  right femoral popliteal bypass using 6x80 propaten graft  . Insertion of dialysis catheter 02/15/2012    Procedure: INSERTION OF DIALYSIS CATHETER;  Surgeon: Chuck Hint, MD;  Location[facility] OR;  Service: Vascular;  Laterality: Right;  insertion of dialysis catheter right internal jugular vein,removal of left internal jugular vein dialysis catheter  . Insertion of dialysis catheter 02/21/2012    Procedure: INSERTION OF DIALYSIS CATHETER;  Surgeon: Chuck Hint, MD;  Location: [facility] OR;  Service: Cardiovascular;  Laterality: N/A;  Insertion new dialysis catheter; possible venoplasty of fibrin sheath of  Right IJ catheter  . Venogram 02/21/2012    Procedure: VENOGRAM;  Surgeon: Chuck Hint, MD;  Location: [facility] OR;  Service: Cardiovascular;  Laterality: N/A;  possible venoplasty    Family History  Problem Relation Age of Onset  . Transient ischemic attack Mother   . Hepatitis Sister   . Diabetes type II Other   . Anesthesia problems Neg Hx   . Hypotension Neg Hx   . Malignant hyperthermia Neg Hx   . Pseudochol deficiency Neg Hx     History  Substance Use Topics  . Smoking status: Current Everyday Smoker -- 2.0 packs/day for 20 years    Types: Cigarettes  . Smokeless tobacco: Never Used   Comment: pt states she is not ready to quit   . Alcohol Use: No    No OB history provided.  Review of Systems  Constitutional: Negative for fever and chills.  HENT: Positive for rhinorrhea. Negative for sore throat and neck pain.   Eyes: Negative for visual disturbance.  Respiratory: Positive for cough and shortness of breath.   Cardiovascular: Positive for chest pain.  Gastrointestinal: Positive for diarrhea. Negative for nausea, vomiting and abdominal pain.  Genitourinary: Negative for dysuria  and frequency.  Musculoskeletal: Negative for back pain.  Skin: Negative for rash.  Neurological: Negative for headaches.    Allergies  Compazine; Contrast media; Meperidine hcl; Metformin and related; Morphine; Nubain; Tobramycin; Topiramate; and Toradol  Home Medications   Current Outpatient Rx  Name Route Sig Dispense Refill  . ALBUTEROL SULFATE HFA 108 (90 BASE) MCG/ACT IN AERS Inhalation Inhale 2 puffs into the lungs every 6 (six) hours as needed. Shortness of breath    . ALPRAZOLAM 1 MG PO TABS Oral Take 1 mg by mouth 3 (three) times daily as needed. For anxiety    . ARIPIPRAZOLE 2 MG PO TABS Oral Take 10 mg by mouth daily.     Marland Kitchen NEPHRO-VITE  0.8 MG PO TABS Oral Take 0.8 mg by mouth at bedtime.    Marland Kitchen CINACALCET HCL 30 MG PO TABS Oral Take 30 mg by mouth daily with supper.     Marland Kitchen CLOPIDOGREL BISULFATE 75 MG PO TABS Oral Take 75 mg by mouth daily. Last dose 12/17/2011    . DARBEPOETIN ALFA-POLYSORBATE 60 MCG/0.3ML IJ SOLN Intravenous Inject 0.3 mLs (60 mcg total) into the vein every Saturday with hemodialysis. 4.2 mL   . SYSTANE LID WIPES EX PADS Right Eye Place 1 application into the right eye daily.    Marland Kitchen HYPROMELLOSE 2.5 % OP SOLN Both Eyes Place 1 drop into both eyes 3 (three) times daily as needed. Dry eyes    . INSULIN GLARGINE 100 UNIT/ML [place] SOLN Subcutaneous Inject 33 Units into the skin at bedtime.     . INSULIN LISPRO (HUMAN) 100 UNIT/ML [place] SOLN Subcutaneous Inject 7-15 Units into the skin 4 (four) times daily -  before meals and at bedtime. TAKING ON SLIDING SCALE 200-249=7 units 250-299=10 units 300-349=12 units >350=15 units    . OFLOXACIN 0.3 % OP SOLN Right Eye Place 1 drop into the right eye 3 (three) times daily.     Marland Kitchen OMEPRAZOLE 40 MG PO CPDR Oral Take 40 mg by mouth daily.     Marland Kitchen PAROXETINE HCL 40 MG PO TABS Oral Take 80 mg by mouth every morning.     Marland Kitchen PYRIDOXINE HCL 100 MG PO TABS Oral Take 100 mg by mouth daily.    Marland Kitchen SANTYL 250 UNIT/GM EX OINT  APPLY DAILY 30 g 1    . SEVELAMER CARBONATE 800 MG PO TABS Oral Take 1,600-4,000 mg by mouth 5 (five) times daily. *Take 5 tablets daily with each meal and take 2 tablets with snacks**      Triage Vitals: BP 146/68  Pulse 97  Temp 97.7 F (36.5 C) (Oral)  Resp 20  Ht 5' 10.5" (1.791 m)  Wt 190 lb (86.183 kg)  BMI 26.88 kg/m2  SpO2 93%  LMP 09/03/2008  Physical Exam  Nursing note and vitals reviewed. Constitutional: She is oriented to person, place, and time. She appears well-developed and well-nourished. No distress.       Somewhat sedated  HENT:  Head: Normocephalic and atraumatic.  Eyes: EOM are normal.  Neck: Neck supple. No tracheal deviation present.  Cardiovascular: Normal rate and regular rhythm.   Pulmonary/Chest: Effort normal. No respiratory distress. Tenderness: mild anterior chest tenderness.       Harsh breath sounds diffsuely  Abdominal: Soft. There is no tenderness.       obese  Musculoskeletal: Normal range of motion. She exhibits edema (pitting edema in lower extremities with mild erythema).  Neurological: She is alert and oriented to person, place, and time.  Skin: Skin is warm and dry.  Psychiatric: She has a normal mood and affect. Her behavior is normal.    ED Course  Procedures (including critical care time)   COORDINATION OF CARE:  9:31AM-Informed pt of normal EKG. Discussed treatment plan which includes blood work and a CXR with pt and pt agreed to plan.   Results for orders placed during the hospital encounter of 03/06/12  CBC WITH DIFFERENTIAL      Component Value Range   WBC 12.4 (*) 4.0 - 10.5 K/uL   RBC 2.81 (*) 3.87 - 5.11 MIL/uL   Hemoglobin 9.1 (*) 12.0 - 15.0 g/dL   HCT 16.1 (*) 09.6 - 04.5 %   MCV 101.1 (*)  78.0 - 100.0 fL   MCH 32.4  26.0 - 34.0 pg   MCHC 32.0  30.0 - 36.0 g/dL   RDW 16.1 (*) 09.6 - 04.5 %   Platelets 311  150 - 400 K/uL   Neutrophils Relative 86 (*) 43 - 77 %   Neutro Abs 10.7 (*) 1.7 - 7.7 K/uL   Lymphocytes Relative 7 (*) 12 -  46 %   Lymphs Abs 0.8  0.7 - 4.0 K/uL   Monocytes Relative 6  3 - 12 %   Monocytes Absolute 0.7  0.1 - 1.0 K/uL   Eosinophils Relative 2  0 - 5 %   Eosinophils Absolute 0.2  0.0 - 0.7 K/uL   Basophils Relative 0  0 - 1 %   Basophils Absolute 0.0  0.0 - 0.1 K/uL  TROPONIN I      Component Value Range   Troponin I <0.30  <0.30 ng/mL  BASIC METABOLIC PANEL      Component Value Range   Sodium 130 (*) 135 - 145 mEq/L   Potassium 3.7  3.5 - 5.1 mEq/L   Chloride 91 (*) 96 - 112 mEq/L   CO2 18 (*) 19 - 32 mEq/L   Glucose, Bld 219 (*) 70 - 99 mg/dL   BUN 79 (*) 6 - 23 mg/dL   Creatinine, Ser 4.09 (*) 0.50 - 1.10 mg/dL   Calcium 81.1  8.4 - 91.4 mg/dL   GFR calc non Af Amer 5 (*) >90 mL/min   GFR calc Af Amer 6 (*) >90 mL/min   Dg Chest Port 1 View  03/06/2012  *RADIOLOGY REPORT*  Clinical Data: Chest pain, cough, history diabetes, coronary disease, prior pulmonary embolism and MI, hypertension  PORTABLE CHEST - 1 VIEW  Comparison: Portable exam 1027 hours compared to 02/21/2012  Findings: Large-bore double-lumen right jugular central venous catheter, tip projecting over right atrium. Enlargement of cardiac silhouette with pulmonary vascular congestion. Minimal atelectasis right base. Lungs otherwise clear. No pleural effusion or pneumothorax. Stents identified at the left axillary vessels.  IMPRESSION: Enlargement of cardiac silhouette with pulmonary vascular congestion. Minimal right basilar atelectasis.  Original Report Authenticated By: Lollie Marrow, M.D.     1. Chest pain   2. End stage renal failure on dialysis     Date: 03/06/2012  Rate: 100  Rhythm: normal sinus rhythm  QRS Axis: normal  Intervals: normal  ST/T Wave abnormalities: nonspecific ST/T changes  Conduction Disutrbances:none  Narrative Interpretation:   Old EKG Reviewed: unchanged     MDM  Patient with chest pain. Sharp and left-sided. She also had some diarrhea. Dialysis would not take her to the chest pain.  EKG is reassuring, and enzymes are negative, but she does have known cardiac disease. Patient was not willing to stay for treatment and left AMA.   I personally performed the services described in this documentation, which was scribed in my presence. The recorded information has been reviewed and considered.        Juliet Rude. [name] Payor, MD 03/06/12 1614

## 2012-03-06 NOTE — ED Notes (Signed)
Pt seems out of it, pt seems very groggy. RN is aware.

## 2012-03-06 NOTE — ED Notes (Signed)
Pt states she is unable to stand for chest x-ray. Changed to portable. Family states pt missed dialysis one day last week and a day this week. Per family pt will be able to have dialysis a couple of hours today and a few hours tomorrow

## 2012-03-06 NOTE — ED Notes (Signed)
Pt c/o left chest pain and shortness of breath since 3:30 this am. Pt also c/o diarrhea. Pt is on hemodialysis but was told to come to ED this am due to chest pain so did not have dialysis.

## 2012-03-07 ENCOUNTER — Observation Stay (HOSPITAL_COMMUNITY)
Admission: EM | Admit: 2012-03-07 | Discharge: 2012-03-09 | Disposition: A | Discharge status: Home / Self Care | Payer: Medicare Other | Attending: Family Medicine | Admitting: Family Medicine

## 2012-03-07 ENCOUNTER — Emergency Department (HOSPITAL_COMMUNITY): Payer: Medicare Other

## 2012-03-07 ENCOUNTER — Telehealth: Payer: Self-pay | Admitting: *Deleted

## 2012-03-07 ENCOUNTER — Encounter (HOSPITAL_COMMUNITY): Payer: Self-pay

## 2012-03-07 DIAGNOSIS — I739 Peripheral vascular disease, unspecified: Secondary | ICD-10-CM | POA: Insufficient documentation

## 2012-03-07 DIAGNOSIS — Z992 Dependence on renal dialysis: Secondary | ICD-10-CM | POA: Insufficient documentation

## 2012-03-07 DIAGNOSIS — F319 Bipolar disorder, unspecified: Secondary | ICD-10-CM | POA: Insufficient documentation

## 2012-03-07 DIAGNOSIS — E1169 Type 2 diabetes mellitus with other specified complication: Secondary | ICD-10-CM | POA: Insufficient documentation

## 2012-03-07 DIAGNOSIS — Z720 Tobacco use: Secondary | ICD-10-CM | POA: Diagnosis present

## 2012-03-07 DIAGNOSIS — E785 Hyperlipidemia, unspecified: Secondary | ICD-10-CM | POA: Insufficient documentation

## 2012-03-07 DIAGNOSIS — G9340 Encephalopathy, unspecified: Secondary | ICD-10-CM | POA: Insufficient documentation

## 2012-03-07 DIAGNOSIS — L97309 Non-pressure chronic ulcer of unspecified ankle with unspecified severity: Secondary | ICD-10-CM | POA: Insufficient documentation

## 2012-03-07 DIAGNOSIS — Z86711 Personal history of pulmonary embolism: Secondary | ICD-10-CM | POA: Insufficient documentation

## 2012-03-07 DIAGNOSIS — E11622 Type 2 diabetes mellitus with other skin ulcer: Secondary | ICD-10-CM

## 2012-03-07 DIAGNOSIS — N186 End stage renal disease: Secondary | ICD-10-CM | POA: Insufficient documentation

## 2012-03-07 DIAGNOSIS — R0789 Other chest pain: Principal | ICD-10-CM | POA: Insufficient documentation

## 2012-03-07 DIAGNOSIS — I2510 Atherosclerotic heart disease of native coronary artery without angina pectoris: Secondary | ICD-10-CM | POA: Insufficient documentation

## 2012-03-07 DIAGNOSIS — Z9119 Patient's noncompliance with other medical treatment and regimen: Secondary | ICD-10-CM | POA: Insufficient documentation

## 2012-03-07 DIAGNOSIS — Z86718 Personal history of other venous thrombosis and embolism: Secondary | ICD-10-CM | POA: Insufficient documentation

## 2012-03-07 DIAGNOSIS — R7989 Other specified abnormal findings of blood chemistry: Secondary | ICD-10-CM | POA: Diagnosis present

## 2012-03-07 DIAGNOSIS — I120 Hypertensive chronic kidney disease with stage 5 chronic kidney disease or end stage renal disease: Secondary | ICD-10-CM | POA: Insufficient documentation

## 2012-03-07 DIAGNOSIS — F17200 Nicotine dependence, unspecified, uncomplicated: Secondary | ICD-10-CM | POA: Insufficient documentation

## 2012-03-07 DIAGNOSIS — R609 Edema, unspecified: Secondary | ICD-10-CM | POA: Insufficient documentation

## 2012-03-07 DIAGNOSIS — R079 Chest pain, unspecified: Secondary | ICD-10-CM

## 2012-03-07 DIAGNOSIS — D649 Anemia, unspecified: Secondary | ICD-10-CM | POA: Insufficient documentation

## 2012-03-07 DIAGNOSIS — E119 Type 2 diabetes mellitus without complications: Secondary | ICD-10-CM

## 2012-03-07 DIAGNOSIS — Z91199 Patient's noncompliance with other medical treatment and regimen due to unspecified reason: Secondary | ICD-10-CM | POA: Insufficient documentation

## 2012-03-07 HISTORY — DX: Patient's noncompliance with renal dialysis for other reason: Z91.158

## 2012-03-07 HISTORY — DX: Patient's noncompliance with renal dialysis: Z91.15

## 2012-03-07 LAB — CBC WITH DIFFERENTIAL/PLATELET
Basophils Absolute: 0.1 10*3/uL (ref 0.0–0.1)
HCT: 29 % — ABNORMAL LOW (ref 36.0–46.0)
Hemoglobin: 9.3 g/dL — ABNORMAL LOW (ref 12.0–15.0)
Lymphocytes Relative: 9 % — ABNORMAL LOW (ref 12–46)
Lymphs Abs: 1 10*3/uL (ref 0.7–4.0)
MCV: 100.3 fL — ABNORMAL HIGH (ref 78.0–100.0)
Monocytes Absolute: 0.7 10*3/uL (ref 0.1–1.0)
Neutro Abs: 9.1 10*3/uL — ABNORMAL HIGH (ref 1.7–7.7)
RBC: 2.89 MIL/uL — ABNORMAL LOW (ref 3.87–5.11)
RDW: 20.7 % — ABNORMAL HIGH (ref 11.5–15.5)
WBC: 11.1 10*3/uL — ABNORMAL HIGH (ref 4.0–10.5)

## 2012-03-07 LAB — COMPREHENSIVE METABOLIC PANEL
ALT: 9 U/L (ref 0–35)
AST: 11 U/L (ref 0–37)
CO2: 20 mEq/L (ref 19–32)
Chloride: 91 mEq/L — ABNORMAL LOW (ref 96–112)
Creatinine, Ser: 7.15 mg/dL — ABNORMAL HIGH (ref 0.50–1.10)
GFR calc Af Amer: 7 mL/min — ABNORMAL LOW (ref >90)
GFR calc non Af Amer: 6 mL/min — ABNORMAL LOW (ref >90)
Glucose, Bld: 266 mg/dL — ABNORMAL HIGH (ref 70–99)
Total Bilirubin: 0.1 mg/dL — ABNORMAL LOW (ref 0.3–1.2)

## 2012-03-07 LAB — PROTIME-INR: INR: 1.25 (ref 0.00–1.49)

## 2012-03-07 MED ORDER — CLOPIDOGREL BISULFATE 75 MG PO TABS
75.0000 mg | ORAL_TABLET | Freq: Every day | ORAL | Status: DC
  Administered 2012-03-08 – 2012-03-09 (×2): 75 mg via ORAL
  Filled 2012-03-07 (×3): qty 1

## 2012-03-07 MED ORDER — SODIUM CHLORIDE 0.9 % IV SOLN
INTRAVENOUS | Status: DC
  Administered 2012-03-07: 40 mL/h via INTRAVENOUS

## 2012-03-07 MED ORDER — SODIUM CHLORIDE 0.9 % IV SOLN: INTRAVENOUS | Status: DC

## 2012-03-07 NOTE — ED Notes (Signed)
Gave report to [facility].  Called updated report to [place], RN on unit [facility]

## 2012-03-07 NOTE — Progress Notes (Signed)
Pt extremely lethargic, unable to obtain admission history or get patient to sign fall prevention safety plan. Pt awake enough to state she is not in pain. Will try to get admission history in the am.

## 2012-03-07 NOTE — Consult Note (Addendum)
Admit date: 03/07/2012 Referring Physician  Dr. Ignacia Palma Primary Physician  Dr. Loney Hering Primary Cardiologist  Dr. Tenny Craw Reason for Consultation  Lambert Mody chest pain  HPI: [name] is a 41 y.o. female who presented to [facility] Emergency Department complaining of intermittent, non radiating sharp chest pain since yesterday. She began having this episode of chest pain lasting few minutes at a time today at the dialysis clinic so they refused to give her dialysis and have an evaluation at the ED. She was also seen in the ER yesterday before her treatment and did not receive dialysis until after leaving AMA from the ER yesterday. Today she states cough, diarrhea for 3 weeks, rash on groin, swelling in legs as associated symptoms. She denies any fever, ear ache, sore throat, and urinary symptoms. She has a heart history and her last stent was in 2009 by [place] to the diagonal. She is a current everyday smoker. She was recently discharged from the Hospitalist service due to mental status changes from overuse of multiple sedative meds, During that hospital stay she had a tunneled dialysis cath placement and SVC arteriogram with angioplasty of a fibrous sheath in the SVC.  Apparently per d/c summary she is frequently over sedated when she is at outpt OV with nephrology.  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  She was accepted by Dr. Patty Sermons for workup of chest pain.  On arrival the patient is very somnolent and I cannot keep awake enough to answer questions adequately.  She states that she does not have chest pain at present and her chest pain was sharp and off and on for several days.  Apparently she also told the ER MD that she had diarrhea and LE edema along with cough for several weeks.  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  She is also dialysis dependent secondary to poorly controlled diabetes and continues to smoke.        PMH:   Past Medical History  Diagnosis Date  . DVT (deep venous thrombosis)     DVT HISTORY  . Diabetes mellitus   . Migraine     HISTORY  . Anxiety and depression   . CAD (coronary artery disease)     Last cath 2009:  LAD stent 80% stenosis. Circumflex 60-70% stenosis AV groove, right coronary artery 50-60% stenosis. She did have cutting balloon angioplasty of the LAD lesion into a diagonal. However, there was restenosis of this and it was managed medically.  . Bipolar 1 disorder   . Hyperlipidemia   . Hyperparathyroidism   . PE (pulmonary embolism)     HISTORY  . Dialysis patient   . Anemia   . Renal failure     dialysis [place] t,th sat  . Nephrotic syndrome   . Peripheral vascular disease   . MRSA infection   . Anxiety   . Depression   . Gastroparesis   . Complication of anesthesia   . PONV (postoperative nausea and vomiting)     x3 days post anesth. on 12/19/2011  . Myocardial infarction     x3 , last one - 10 yrs. ago  . Hypertension     sees Dr. Antoine Poche - [facility], last  stress test 11/2011, see result in EPIC, saw Dr. Antoine Poche as well at that time    . Shortness of breath   . Recurrent upper respiratory infection (URI)     Bronchitis 11/2011 - saw Dr. Loney Hering, treating currently /w antibiotic   . GERD (gastroesophageal reflux disease)   . Pneumonia     [facility]- 2012  . Noncompliance of patient with renal dialysis      PSH:   Past Surgical History  Procedure Date  . Incise and drain abcess     OF THIGHS FROM INSULIN INJECTIONS  . Heart stents   . Portacath placement 2003  . Cataract extraction w/phaco 11/09/2011    Procedure: CATARACT EXTRACTION PHACO AND INTRAOCULAR LENS PLACEMENT (IOC);  Surgeon: Edmon Crape, MD;  Location: [facility] OR;  Service: Ophthalmology;  Laterality: Right;  . Pars plana vitrectomy  11/09/2011    Procedure: PARS PLANA VITRECTOMY WITH 23 GAUGE;  Surgeon: Edmon Crape, MD;  Location: [facility] OR;  Service: Ophthalmology;  Laterality: Right;  Ahmed Valve; Scleral Reinforcement Graft  . Dg av dialysis  shunt access exist*l* or 12/13/11    left arm  . Dialysis cath inserted & portacath      dialysis catheter inserted & portacath d/c'd- 12/19/2011  . Back surgery X 4    x4 times   . Cholecystectomy   . Eye surgery   . Cardiac catheterization   . Aorta - bilateral femoral artery bypass graft 12/28/2011    Procedure: AORTA BIFEMORAL BYPASS GRAFT;  Surgeon: Nada Libman, MD;  Location: [facility] OR;  Service: Vascular;  Laterality: N/A;  AortoBifemoral Bypass using hemasheild 14x7 graft  . Femoral-popliteal bypass graft 12/28/2011    Procedure: BYPASS GRAFT FEMORAL-POPLITEAL ARTERY;  Surgeon: Nada Libman, MD;  Location: [facility] OR;  Service: Vascular;  Laterality: Right;  right femoral popliteal bypass using 6x80 propaten graft  . Insertion of dialysis catheter 02/15/2012    Procedure: INSERTION OF DIALYSIS CATHETER;  Surgeon: Chuck Hint, MD;  Location: [facility] OR;  Service: Vascular;  Laterality: Right;  insertion of dialysis catheter right internal jugular vein,removal of left internal jugular vein dialysis catheter  . Insertion of dialysis catheter 02/21/2012    Procedure: INSERTION OF DIALYSIS CATHETER;  Surgeon: Chuck Hint, MD;  Location: [facility] OR;  Service: Cardiovascular;  Laterality: N/A;  Insertion new dialysis catheter; possible venoplasty of fibrin sheath of  Right IJ catheter  . Venogram 02/21/2012    Procedure: VENOGRAM;  Surgeon: Chuck Hint, MD;  Location: [facility] OR;  Service: Cardiovascular;  Laterality: N/A;  possible venoplasty    Allergies:  Compazine; Contrast media; Meperidine hcl; Metformin and related; Morphine; Nubain; Tobramycin; Topiramate; and Toradol Prior to Admit Meds:   Prescriptions prior to admission  Medication Sig Dispense Refill  . albuterol  (PROVENTIL HFA;VENTOLIN HFA) 108 (90 BASE) MCG/ACT inhaler Inhale 2 puffs into the lungs every 6 (six) hours as needed. Shortness of breath      . ALPRAZolam (XANAX) 1 MG tablet Take 1 mg by mouth 3 (three) times daily as needed. For anxiety      . ARIPiprazole (ABILIFY) 2 MG tablet Take 10 mg by mouth daily.       Marland Kitchen b complex-vitamin c-folic acid (NEPHRO-VITE) 0.8 MG TABS Take 0.8 mg by mouth at bedtime.      . cinacalcet (SENSIPAR) 30 MG tablet Take 30 mg by mouth daily with supper.       . clopidogrel (PLAVIX) 75 MG tablet Take 75  mg by mouth daily. Last dose 12/17/2011      . darbepoetin (ARANESP) 60 MCG/0.3ML SOLN Inject 0.3 mLs (60 mcg total) into the vein every Saturday with hemodialysis.  4.2 mL    . Eyelid Cleansers (SYSTANE LID WIPES) PADS Place 1 application into the right eye daily.      . hydroxypropyl methylcellulose (ISOPTO TEARS) 2.5 % ophthalmic solution Place 1 drop into both eyes 3 (three) times daily as needed. Dry eyes      . insulin glargine (LANTUS) 100 UNIT/ML injection Inject 33 Units into the skin at bedtime.       . insulin lispro (HUMALOG) 100 UNIT/ML injection Inject 7-15 Units into the skin 4 (four) times daily -  before meals and at bedtime. TAKING ON SLIDING SCALE 200-249=7 units 250-299=10 units 300-349=12 units >350=15 units      . ofloxacin (OCUFLOX) 0.3 % ophthalmic solution Place 1 drop into the right eye 3 (three) times daily.       Marland Kitchen omeprazole (PRILOSEC) 40 MG capsule Take 40 mg by mouth daily.       Marland Kitchen PARoxetine (PAXIL) 40 MG tablet Take 80 mg by mouth every morning.       . pyridoxine (B-6) 100 MG tablet Take 100 mg by mouth daily.      Marland Kitchen SANTYL ointment APPLY DAILY  30 g  1  . sevelamer (RENVELA) 800 MG tablet Take 1,600-4,000 mg by mouth 5 (five) times daily. *Take 5 tablets daily with each meal and take 2 tablets with snacks**       Fam HX:    Family History  Problem Relation Age of Onset  . Transient ischemic attack Mother   . Hepatitis Sister     . Diabetes type II Other   . Anesthesia problems Neg Hx   . Hypotension Neg Hx   . Malignant hyperthermia Neg Hx   . Pseudochol deficiency Neg Hx    Social HX:    History   Social History  . Marital Status: Divorced    Spouse Name: N/A    Number of Children: N/A  . Years of Education: N/A   Occupational History  . Not on file.   Social History Main Topics  . Smoking status: Current Everyday Smoker -- 2.0 packs/day for 20 years    Types: Cigarettes  . Smokeless tobacco: Never Used   Comment: pt states she is not ready to quit   . Alcohol Use: No  . Drug Use: No  . Sexually Active: No   Other Topics Concern  . Not on file   Social History Narrative  . No narrative on file     ROS:  All 11 ROS were addressed and are negative except what is stated in the HPI  Physical Exam: Blood pressure 167/85, pulse 89, temperature 97.6 F (36.4 C), temperature source Oral, resp. rate 18, height 5\' 10"  (1.778 m), weight 90 kg (198 lb 6.6 oz), last menstrual period 09/03/2008, SpO2 98.00%.    General: Well developed, well nourished, in no acute distress Head: Eyes PERRLA, No xanthomas.   Normal cephalic and atramatic  Lungs:   Clear bilaterally to auscultation and percussion. Heart:   HRRR S1 S2 Pulses are 2+ & equal. Abdomen: Bowel sounds are positive, abdomen soft and non-tender without masses  Extremities:   2+ edema bilaterally and wound on right ankle Neuro: Alert and oriented X 3. Psych:  Good affect, responds appropriately    Labs:   Lab Results  Component Value Date   WBC 11.1* 03/07/2012   HGB 9.3* 03/07/2012   HCT 29.0* 03/07/2012   MCV 100.3* 03/07/2012   PLT 312 03/07/2012    Lab 03/07/12 1419  NA 130*  K 3.8  CL 91*  CO2 20  BUN 63*  CREATININE 7.15*  CALCIUM 10.2  PROT 6.9  BILITOT 0.1*  ALKPHOS 191*  ALT 9  AST 11  GLUCOSE 266*   No results found for this basename: PTT   Lab Results  Component Value Date   INR 1.25 03/07/2012   INR 1.07  02/21/2012   INR 1.30 02/15/2012   Lab Results  Component Value Date   CKTOTAL 31 02/15/2012   CKMB 2.4 02/15/2012   TROPONINI <0.30 03/06/2012     Lab Results  Component Value Date   CHOL 146 10/31/2011   CHOL  Value: 181        ATP III CLASSIFICATION:  <200     mg/dL   Desirable  161-096  mg/dL   Borderline High  >=045    mg/dL   High        4/0/9811   CHOL  Value: 364        ATP III CLASSIFICATION:  <200     mg/dL   Desirable  914-782  mg/dL   Borderline High  >=956    mg/dL   High* 21/30/8657   Lab Results  Component Value Date   HDL 42 10/31/2011   HDL 53 02/25/2010   HDL 61 08/07/2007   Lab Results  Component Value Date   LDLCALC 63 10/31/2011   LDLCALC  Value: 85        Total Cholesterol/HDL:CHD Risk Coronary Heart Disease Risk Table                     Men   Women  1/2 Average Risk   3.4   3.3  Average Risk       5.0   4.4  2 X Average Risk   9.6   7.1  3 X Average Risk  23.4   11.0        Use the calculated Patient Ratio above and the CHD Risk Table to determine the patient's CHD Risk.        ATP III CLASSIFICATION (LDL):  <100     mg/dL   Optimal  846-962  mg/dL   Near or Above                    Optimal  130-159  mg/dL   Borderline  952-841  mg/dL   High  >324     mg/dL   Very High 4/0/1027   LDLCALC  Value: UNABLE TO CALCULATE IF TRIGLYCERIDE OVER 400 mg/dL        Total Cholesterol/HDL:CHD Risk Coronary Heart Disease Risk Table                     Men   Women  1/2 Average Risk   3.4   3.3 08/07/2007   Lab Results  Component Value Date   TRIG 203* 10/31/2011   TRIG 213* 02/25/2010   TRIG 528* 08/07/2007   Lab Results  Component Value Date   CHOLHDL 3.5 10/31/2011   CHOLHDL 3.4 02/25/2010   CHOLHDL 6.0 08/07/2007   No results found for this basename: LDLDIRECT      Radiology:  US Venous Img Lower Bilateral  03/07/2012  *RADIOLOGY REPORT*  Clinical Data: Bilateral lower extremity edema; post aortobifemoral bypass grafting 3 weeks ago  VENOUS DUPLEX ULTRASOUND OF BILATERAL LOWER  EXTREMITIES  Technique:  Gray-scale sonography with graded compression, as well as color Doppler and duplex ultrasound, were performed to evaluate the deep venous system of both lower extremities from the level of the common femoral vein through the popliteal and proximal calf veins.  Spectral Doppler was utilized to evaluate flow at rest and with distal augmentation maneuvers. The common femoral veins are inadequately visualized due to the surgical dressings at these regions.  Comparison:  02/24/2010  Findings:  The deep venous systems appear patent and compressible at the profunda femoral, femoral, popliteal veins.  Common femoral veins are not adequately visualized due to dressings at inguinal regions bilaterally from prior surgery.  The saphenofemoral junctions are not adequately visualized.  No evidence of deep venous thrombus identified within the visualized segments. Spontaneous venous flow is present with intact augmentation. Large hypoechoic collection identified at the proximal right thigh, 6.5 x 3.0 x 5.0 cm in size, question postoperative lymphocele, seroma or resolving hematoma.  IMPRESSION: Unable to adequately assess the common femoral veins and saphenofemoral junctions due to dressings. Postoperative fluid collection at the proximal left thigh 6.5 x 3.0 x 5.0 cm in size question lymphocele, seroma or resolving hematoma. No evidence of deep venous thrombosis is within the visualized portions of the deep venous systems of the lower extremities as above.  Original Report Authenticated By: Lollie Marrow, M.D.   Dg Chest Port 1 View  03/07/2012  *RADIOLOGY REPORT*  Clinical Data: Chest pain  PORTABLE CHEST - 1 VIEW  Comparison: 03/06/2012; 02/21/2012; 02/15/2012  Findings:  Grossly unchanged enlarged cardiac silhouette and mediastinal contours.  Stable positioning of support apparatus with dialysis catheter tips overlying the central location of the right atrium. Grossly unchanged bibasilar  heterogeneous opacities, right greater than left.  There is chronic blunting of the right costophrenic angle.  No definite pneumothorax.  Unchanged bones. Left axillary vascular stent.  IMPRESSION:  1.  No acute cardiopulmonary disease. 2.  Chronic blunting of the right costophrenic angle with bibasilar heterogeneous opacities, right greater than left, favored to represent atelectasis.  Original Report Authenticated By: Waynard Reeds, M.D.   Dg Chest Port 1 View  03/06/2012  *RADIOLOGY REPORT*  Clinical Data: Chest pain, cough, history diabetes, coronary disease, prior pulmonary embolism and MI, hypertension  PORTABLE CHEST - 1 VIEW  Comparison: Portable exam 1027 hours compared to 02/21/2012  Findings: Large-bore double-lumen right jugular central venous catheter, tip projecting over right atrium. Enlargement of cardiac silhouette with pulmonary vascular congestion. Minimal atelectasis right base. Lungs otherwise clear. No pleural effusion or pneumothorax. Stents identified at the left axillary vessels.  IMPRESSION: Enlargement of cardiac silhouette with pulmonary vascular congestion. Minimal right basilar atelectasis.  Original Report Authenticated By: Lollie Marrow, M.D.    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  Atypical chest pain with negative troponin x 1.  EKG shows isolated J point elevation in V2 of 1mm otherwise no changes.  Patient is currently pain free.  She had a normal stress perfusion study in April 2013 2.  Somnolence ? Secondary to sedatives at home.  She has a history of overuse of sedatives per last d/c summary and ofter presents to renal OV sedated 3.  Recent aorto-bifem with Right fem-pop May 2013 with chronic wound infections on IV antibiotics dosed  at HD 4.  ESRD on HD 5.  Recent cough and diarrhea x 3 weeks 6.  Elevated WBC 7.  History of DVT and PE with new LE edema and no evidence of DVT on Venous dopplers although not  all areas adequately visualized due to bandages - there is a 6x5x3cm fluid collection in left proximal thigh 8.  CAD with last cath 2009 with cutting balloon PTCA of LAD into diagonal with residual 60-70% left circ and 50-60% RCA with negative stress perfusion study in April 2013 for preoperative clearance.  PLAN:   1. Cycle cardiac enzymes x 3 2. Continue Plavix 3. ? Cross reaction to ASA and has not been on ASA as outpt so will hold for now 4.  Admission to be done by [facility] due to multiple medical problems 5.  Further cardiac workup per [facility] 6.  Check DDimer 7.  IV Heparin per pharmacy protocol if head CT ok 8.  Head CT for hypersomnelence  Quintella Reichert, MD  03/07/2012  10:06 PM

## 2012-03-07 NOTE — ED Notes (Signed)
[place] [employer] served [name].  Pt. Calm and cooperative at this point.  Agrees to proceed w/transfer.

## 2012-03-07 NOTE — ED Notes (Signed)
Pt reports had dialysis for 2h and yesterday.  Reports had been having cp and nausea x 3 days.  Reports was evaluated here and was encouraged to stay but pt did not want to stay.  Pt went to dialysis today but was having chest pain so the dialysis clinic refused to give her dialysis.

## 2012-03-07 NOTE — Telephone Encounter (Signed)
Agree with adding Nitrostat 0.4 mg #25 i PO prn CP, 1 refill

## 2012-03-07 NOTE — ED Notes (Signed)
Pt is aware of a urine sample, pt states she cannot void. RN is aware.

## 2012-03-07 NOTE — ED Notes (Signed)
Discussed w/pt. Option of being admitted in order to dialyze.  She adamantly refuses to be admitted, even when encouraged by family members.  Discussed w/her the risks of refusing.  She states she can go to dialysis tomorrow.  I reminded her that her facility would not dialyze her while she was having chest pain and that the likely reason for the chest pain was that she was 24 lbs above dry weight.  Strongly encouraged her to reconsider being admitted, but she con't. To refuse.

## 2012-03-07 NOTE — Telephone Encounter (Signed)
Returned call - spoke with sister Aurther Loft) states she has went back to the hospital in [place].

## 2012-03-07 NOTE — ED Provider Notes (Addendum)
History  This chart was scribed for [name] Cooper III, MD by Ladona Ridgel Day. This patient was seen in room APA15/APA15 and the patient's care was started at 1321.  CSN: [id number]  Arrival date & time 03/07/12  1213   First MD Initiated Contact with Patient 03/07/12 1321      Chief Complaint  Patient presents with  . Chest Pain    Patient is a 41 y.o. female presenting with chest pain. The history is provided by the patient and a relative. No language interpreter was used.  Chest Pain Primary symptoms include cough (Chronic smokers cough). Pertinent negatives for primary symptoms include no fever, no shortness of breath, no abdominal pain, no nausea and no vomiting.  Pertinent negatives for associated symptoms include no numbness.    [name] is a 41 y.o. female who presents to the Emergency Department complaining of intermittent, non radiating sharp chest pain since yesterday. She began having this episode of chest pain lasting few minutes at a time today at the dialysis clinic so they refused to give her dialysis and have an evaluation here at the ED. She was also seen here yesterday before her treatment and did not receive dialysis until after leaving AMA from the ED yesterday. Today she states cough, diarrhea for 3 weeks, rash on groin, swelling in legs as associated symptoms. She denies any fever, ear ache, sore throat, and urinary symptoms. She has a heart history and her last stent was in 2009 by [facility]. She is a current everyday smoker.   Past Medical History  Diagnosis Date  . DVT (deep venous thrombosis)     DVT HISTORY  . Diabetes mellitus   . Migraine     HISTORY  . Anxiety and depression   . CAD (coronary artery disease)     Last cath 2009:  LAD stent 80% stenosis. Circumflex 60-70% stenosis AV groove, right coronary artery 50-60% stenosis. She did have cutting balloon angioplasty of the LAD lesion into a diagonal. However, there was restenosis of this and it was  managed medically.  . Bipolar 1 disorder   . Hyperlipidemia   . Hyperparathyroidism   . PE (pulmonary embolism)     HISTORY  . Dialysis patient   . Anemia   . Renal failure     dialysis [place] t,th sat  . Nephrotic syndrome   . Peripheral vascular disease   . MRSA infection   . Anxiety   . Depression   . Gastroparesis   . Complication of anesthesia   . PONV (postoperative nausea and vomiting)     x3 days post anesth. on 12/19/2011  . Myocardial infarction     x3 , last one - 10 yrs. ago  . Hypertension     sees Dr. Antoine Poche - [place], last stress test 11/2011, see result in EPIC, saw Dr. Antoine Poche as well at that time    . Shortness of breath   . Recurrent upper respiratory infection (URI)     Bronchitis 11/2011 - saw Dr. Loney Hering, treating currently /w antibiotic   . GERD (gastroesophageal reflux disease)   . Pneumonia     [facility]- 2012    Past Surgical History  Procedure Date  . Incise and drain abcess     OF THIGHS FROM INSULIN INJECTIONS  . Heart stents   . Portacath placement 2003  . Cataract extraction w/phaco 11/09/2011    Procedure: CATARACT EXTRACTION PHACO AND INTRAOCULAR LENS PLACEMENT (IOC);  Surgeon: Edmon Crape, MD;  Location: [facility] OR;  Service: Ophthalmology;  Laterality: Right;  . Pars plana vitrectomy 11/09/2011    Procedure: PARS PLANA VITRECTOMY WITH 23 GAUGE;  Surgeon: Edmon Crape, MD;  Location: [facility] OR;  Service: Ophthalmology;  Laterality: Right;  Ahmed Valve; Scleral Reinforcement Graft  . Dg av dialysis  shunt access exist*l* or 12/13/11    left arm  . Dialysis cath inserted & portacath      dialysis catheter inserted & portacath d/c'd- 12/19/2011  . Back surgery X 4    x4 times   . Cholecystectomy   . Eye surgery   . Cardiac catheterization   . Aorta - bilateral femoral artery bypass graft 12/28/2011    Procedure: AORTA BIFEMORAL BYPASS GRAFT;  Surgeon: Nada Libman, MD;  Location: [facility] OR;  Service: Vascular;  Laterality: N/A;  AortoBifemoral Bypass using  hemasheild 14x7 graft  . Femoral-popliteal bypass graft 12/28/2011    Procedure: BYPASS GRAFT FEMORAL-POPLITEAL ARTERY;  Surgeon: Nada Libman, MD;  Location: [facility] OR;  Service: Vascular;  Laterality: Right;  right femoral popliteal bypass using 6x80 propaten graft  . Insertion of dialysis catheter 02/15/2012    Procedure: INSERTION OF DIALYSIS CATHETER;  Surgeon: Chuck Hint, MD;  Location: [facility] OR;  Service: Vascular;  Laterality: Right;  insertion of dialysis catheter right internal jugular vein,removal of left internal jugular vein dialysis catheter  . Insertion of dialysis catheter 02/21/2012    Procedure: INSERTION OF DIALYSIS CATHETER;  Surgeon: Chuck Hint, MD;  Location: [facility] OR;  Service: Cardiovascular;  Laterality: N/A;  Insertion new dialysis catheter; possible venoplasty of fibrin sheath of  Right IJ catheter  . Venogram 02/21/2012    Procedure: VENOGRAM;  Surgeon: Chuck Hint, MD;  Location: [facility] OR;  Service: Cardiovascular;  Laterality: N/A;  possible venoplasty    Family History  Problem Relation Age of Onset  . Transient ischemic attack Mother   . Hepatitis Sister   . Diabetes type II Other   . Anesthesia problems Neg Hx   . Hypotension Neg Hx   . Malignant hyperthermia Neg Hx   . Pseudochol deficiency Neg Hx     History  Substance Use Topics  . Smoking status: Current Everyday Smoker -- 2.0 packs/day for 20 years    Types: Cigarettes  . Smokeless tobacco: Never Used   Comment: pt states she is not ready to quit   . Alcohol Use: No    OB History    Grav Para Term Preterm Abortions TAB SAB Ect Mult Living                  Review of Systems  Constitutional: Negative for fever.  HENT: Negative for congestion and rhinorrhea.   Eyes: Negative for redness.  Respiratory: Positive for cough (Chronic smokers cough). Negative for shortness of breath.   Cardiovascular: Positive for chest pain and leg swelling (Bilateral legs.).  Gastrointestinal:  Positive for diarrhea (Past 3 weeks.). Negative for nausea, vomiting, abdominal pain and abdominal distention.  Genitourinary: Positive for decreased urine volume.       She makes some urine.  Skin: Negative for color change and pallor.  Neurological: Negative for syncope, numbness and headaches.  All other systems reviewed and are negative.    Allergies  Compazine; Contrast media; Meperidine hcl; Metformin and related; Morphine; Nubain; Tobramycin; Topiramate; and Toradol  Home Medications   Current Outpatient Rx  Name Route Sig Dispense Refill  . ALBUTEROL SULFATE HFA 108 (90 BASE) MCG/ACT IN AERS  Inhalation Inhale 2 puffs into the lungs every 6 (six) hours as needed. Shortness of breath    . ALPRAZOLAM 1 MG PO TABS Oral Take 1 mg by mouth 3 (three) times daily as needed. For anxiety    . ARIPIPRAZOLE 2 MG PO TABS Oral Take 10 mg by mouth daily.     Marland Kitchen NEPHRO-VITE 0.8 MG PO TABS Oral Take 0.8 mg by mouth at bedtime.    Marland Kitchen CINACALCET HCL 30 MG PO TABS Oral Take 30 mg by mouth daily with supper.     Marland Kitchen CLOPIDOGREL BISULFATE 75 MG PO TABS Oral Take 75 mg by mouth daily. Last dose 12/17/2011    . DARBEPOETIN ALFA-POLYSORBATE 60 MCG/0.3ML IJ SOLN Intravenous Inject 0.3 mLs (60 mcg total) into the vein every Saturday with hemodialysis. 4.2 mL   . SYSTANE LID WIPES EX PADS Right Eye Place 1 application into the right eye daily.    Marland Kitchen HYPROMELLOSE 2.5 % OP SOLN Both Eyes Place 1 drop into both eyes 3 (three) times daily as needed. Dry eyes    . INSULIN GLARGINE 100 UNIT/ML [place] SOLN Subcutaneous Inject 33 Units into the skin at bedtime.     . INSULIN LISPRO (HUMAN) 100 UNIT/ML [place] SOLN Subcutaneous Inject 7-15 Units into the skin 4 (four) times daily -  before meals and at bedtime. TAKING ON SLIDING SCALE 200-249=7 units 250-299=10 units 300-349=12 units >350=15 units    . OFLOXACIN 0.3 % OP SOLN Right Eye Place 1 drop into the right eye 3 (three) times daily.     Marland Kitchen OMEPRAZOLE 40 MG PO CPDR Oral  Take 40 mg by mouth daily.     Marland Kitchen PAROXETINE HCL 40 MG PO TABS Oral Take 80 mg by mouth every morning.     Marland Kitchen PYRIDOXINE HCL 100 MG PO TABS Oral Take 100 mg by mouth daily.    Marland Kitchen SANTYL 250 UNIT/GM EX OINT  APPLY DAILY 30 g 1  . SEVELAMER CARBONATE 800 MG PO TABS Oral Take 1,600-4,000 mg by mouth 5 (five) times daily. *Take 5 tablets daily with each meal and take 2 tablets with snacks**      Triage Vitals: BP 137/72  Pulse 89  Ht 5\' 10"  (1.778 m)  Wt 204 lb 2.3 oz (92.6 kg)  BMI 29.29 kg/m2  SpO2 97%  LMP 09/03/2008  Physical Exam  Nursing note and vitals reviewed. Constitutional: She is oriented to person, place, and time. She appears well-developed.  HENT:  Head: Normocephalic and atraumatic.  Mouth/Throat: Oropharynx is clear and moist.  Eyes: Conjunctivae and EOM are normal.  Neck: Normal range of motion. Neck supple. No tracheal deviation present.  Cardiovascular: Normal rate, regular rhythm and normal heart sounds.   Pulmonary/Chest: Effort normal and breath sounds normal. No respiratory distress. She has no wheezes.       Right chest dialysis catheter port  Abdominal: Soft. She exhibits no distension. There is no tenderness. There is no rebound.       Surgical scar bilateral groin and mid abdomen.  Musculoskeletal: Normal range of motion. She exhibits edema (Bilateral legs profoundly edematous ).  Neurological: She is alert and oriented to person, place, and time.  Skin: Skin is warm and dry.       Surgical scar mid abdomen and bilateral groin.   Psychiatric: She has a normal mood and affect. Her behavior is normal.      ED Course  Procedures (including critical care time) DIAGNOSTIC STUDIES: Oxygen Saturation is 97%  on room air, adequate by my interpretation.    COORDINATION OF CARE: At 145 PM  Discussed treatment plan with patient which includes IV fluids, BLE doppler study, CXR, blood work, and heart markers . Patient agrees.   Labs Reviewed - No data to  display Dg Chest [facility] 1 View  03/06/2012  *RADIOLOGY REPORT*  Clinical Data: Chest pain, cough, history diabetes, coronary disease, prior pulmonary embolism and MI, hypertension  PORTABLE CHEST - 1 VIEW  Comparison: Portable exam 1027 hours compared to 02/21/2012  Findings: Large-bore double-lumen right jugular central venous catheter, tip projecting over right atrium. Enlargement of cardiac silhouette with pulmonary vascular congestion. Minimal atelectasis right base. Lungs otherwise clear. No pleural effusion or pneumothorax. Stents identified at the left axillary vessels.  IMPRESSION: Enlargement of cardiac silhouette with pulmonary vascular congestion. Minimal right basilar atelectasis.  Original Report Authenticated By: Lollie Marrow, M.D.   3:21 PM Results for orders placed during the hospital encounter of 03/07/12  CBC WITH DIFFERENTIAL      Component Value Range   WBC 11.1 (*) 4.0 - 10.5 K/uL   RBC 2.89 (*) 3.87 - 5.11 MIL/uL   Hemoglobin 9.3 (*) 12.0 - 15.0 g/dL   HCT 96.0 (*) 45.4 - 09.8 %   MCV 100.3 (*) 78.0 - 100.0 fL   MCH 32.2  26.0 - 34.0 pg   MCHC 32.1  30.0 - 36.0 g/dL   RDW 11.9 (*) 14.7 - 82.9 %   Platelets 312  150 - 400 K/uL   Neutrophils Relative 82 (*) 43 - 77 %   Neutro Abs 9.1 (*) 1.7 - 7.7 K/uL   Lymphocytes Relative 9 (*) 12 - 46 %   Lymphs Abs 1.0  0.7 - 4.0 K/uL   Monocytes Relative 6  3 - 12 %   Monocytes Absolute 0.7  0.1 - 1.0 K/uL   Eosinophils Relative 2  0 - 5 %   Eosinophils Absolute 0.2  0.0 - 0.7 K/uL   Basophils Relative 1  0 - 1 %   Basophils Absolute 0.1  0.0 - 0.1 K/uL  COMPREHENSIVE METABOLIC PANEL      Component Value Range   Sodium 130 (*) 135 - 145 mEq/L   Potassium 3.8  3.5 - 5.1 mEq/L   Chloride 91 (*) 96 - 112 mEq/L   CO2 20  19 - 32 mEq/L   Glucose, Bld 266 (*) 70 - 99 mg/dL   BUN 63 (*) 6 - 23 mg/dL   Creatinine, Ser 5.62 (*) 0.50 - 1.10 mg/dL   Calcium 13.0  8.4 - 86.5 mg/dL   Total Protein 6.9  6.0 - 8.3 g/dL   Albumin 2.4  (*) 3.5 - 5.2 g/dL   AST 11  0 - 37 U/L   ALT 9  0 - 35 U/L   Alkaline Phosphatase 191 (*) 39 - 117 U/L   Total Bilirubin 0.1 (*) 0.3 - 1.2 mg/dL   GFR calc non Af Amer 6 (*) >90 mL/min   GFR calc Af Amer 7 (*) >90 mL/min  PROTIME-INR      Component Value Range   Prothrombin Time 16.0 (*) 11.6 - 15.2 seconds   INR 1.25  0.00 - 1.49  POCT I-STAT TROPONIN I      Component Value Range   Troponin i, poc 0.03  0.00 - 0.08 ng/mL   Comment 3            US Venous Img Lower Bilateral  03/07/2012  *  RADIOLOGY REPORT*  Clinical Data: Bilateral lower extremity edema; post aortobifemoral bypass grafting 3 weeks ago  VENOUS DUPLEX ULTRASOUND OF BILATERAL LOWER EXTREMITIES  Technique:  Gray-scale sonography with graded compression, as well as color Doppler and duplex ultrasound, were performed to evaluate the deep venous system of both lower extremities from the level of the common femoral vein through the popliteal and proximal calf veins.  Spectral Doppler was utilized to evaluate flow at rest and with distal augmentation maneuvers. The common femoral veins are inadequately visualized due to the surgical dressings at these regions.  Comparison:  02/24/2010  Findings:  The deep venous systems appear patent and compressible at the profunda femoral, femoral, popliteal veins.  Common femoral veins are not adequately visualized due to dressings at inguinal regions bilaterally from prior surgery.  The saphenofemoral junctions are not adequately visualized.  No evidence of deep venous thrombus identified within the visualized segments. Spontaneous venous flow is present with intact augmentation. Large hypoechoic collection identified at the proximal right thigh, 6.5 x 3.0 x 5.0 cm in size, question postoperative lymphocele, seroma or resolving hematoma.  IMPRESSION: Unable to adequately assess the common femoral veins and saphenofemoral junctions due to dressings. Postoperative fluid collection at the proximal left  thigh 6.5 x 3.0 x 5.0 cm in size question lymphocele, seroma or resolving hematoma. No evidence of deep venous thrombosis is within the visualized portions of the deep venous systems of the lower extremities as above.  Original Report Authenticated By: Lollie Marrow, M.D.   Dg Chest Port 1 View  03/07/2012  *RADIOLOGY REPORT*  Clinical Data: Chest pain  PORTABLE CHEST - 1 VIEW  Comparison: 03/06/2012; 02/21/2012; 02/15/2012  Findings:  Grossly unchanged enlarged cardiac silhouette and mediastinal contours.  Stable positioning of support apparatus with dialysis catheter tips overlying the central location of the right atrium. Grossly unchanged bibasilar heterogeneous opacities, right greater than left.  There is chronic blunting of the right costophrenic angle.  No definite pneumothorax.  Unchanged bones. Left axillary vascular stent.  IMPRESSION:  1.  No acute cardiopulmonary disease. 2.  Chronic blunting of the right costophrenic angle with bibasilar heterogeneous opacities, right greater than left, favored to represent atelectasis.  Original Report Authenticated By: Waynard Reeds, M.D.    Date: 03/07/2012  Rate: 94  Rhythm: normal sinus rhythm  QRS Axis: normal  Intervals: PR prolonged and QT prolonged QRS:  Left ventricular hypertrophy; Poor R wave progression in precordial leads suggests old anterior myocardial infarction.  ST/T Wave abnormalities: normal  Conduction Disutrbances:first-degree A-V block   Narrative Interpretation: Abnormal EKG  Old EKG Reviewed: unchanged    3:22 PM Lab workup reviewed. TNI negative.  Venous dopplers negative for DVT; has seroma in left proximal thigh, site of her recent vascular surgery.  Call to [facility] regarding her chest pain --> Case discussed with Dr. Dietrich Pates, who accepts pt for transfer to a telemetry bed at [facility].  1. Chest pain   2. End stage renal disease   3. Peripheral vascular disease   4. Peripheral edema       I personally performed the services described in this documentation, which was scribed in my presence. The recorded information has been reviewed and considered.  [name], M.D.  4:12 PM Pt refused to sign her transfer consent.  I told her that she needed to sign her consent, as she would be back tomorrow with the same chest pain.  The nurse tells me that pt's  family has gone to the courthouse to seek involuntary commitment papers on her to compel her to have treatment.     [name] Cooper III, MD 03/07/12 1534     [name] Cooper III, MD 03/07/12 1614  [name] Cooper III, MD 03/24/12 1328

## 2012-03-07 NOTE — Progress Notes (Signed)
Pt has a 5x6 wound on the right ankle, previous dressing taken off to examine and new wet to dry dressing placed. May need wound consult.

## 2012-03-07 NOTE — ED Provider Notes (Deleted)
12:33 PM  Date: 03/07/2012  Rate: 94  Rhythm: normal sinus rhythm  QRS Axis: left  Intervals: PR prolonged and QT prolonged  ST/T Wave abnormalities: normal QRS:  Poor R wave progression in precordial leads suggests possible old anterior myocardial infarction.  Conduction Disutrbances:first-degree A-V block   Narrative Interpretation: Abnormal EKG  Old EKG Reviewed: unchanged    Carleene Cooper III, MD 03/07/12 1235

## 2012-03-07 NOTE — ED Notes (Signed)
Report called to [facility], RN on unit 2000 at [facility]

## 2012-03-07 NOTE — ED Notes (Signed)
Currently awaiting arrival of [place] [employer] to serve patient w/involuntary committal papers taken out by patient's mother and sister.

## 2012-03-07 NOTE — Telephone Encounter (Signed)
Patient called into office requesting to be seen today for chest pain.  After further questioning, she states that she is not having any active chest pain.  Patient was at [facility] ED yesterday, but left AMA.  Stated she wanted to do her dialysis at home.  Stated that everything checked out okay, but was told to call us for appointment.  She is also requesting refill on her NTG but I did not see this on her medication list.  OV scheduled for Wednesday, July 17 at 1:40 with Herma Carson, PA in our [place] office.  Instructed to return to ED if symptoms persist / worsen.

## 2012-03-07 NOTE — ED Notes (Signed)
Pt refuses to sign transfer forms. Pt states she is "not going to [facility] Dr. Ignacia Palma informed.

## 2012-03-08 ENCOUNTER — Observation Stay (HOSPITAL_COMMUNITY): Payer: Medicare Other

## 2012-03-08 ENCOUNTER — Encounter (HOSPITAL_COMMUNITY): Payer: Self-pay | Admitting: Internal Medicine

## 2012-03-08 DIAGNOSIS — R079 Chest pain, unspecified: Secondary | ICD-10-CM

## 2012-03-08 LAB — GLUCOSE, CAPILLARY
Glucose-Capillary: 204 mg/dL — ABNORMAL HIGH (ref 70–99)
Glucose-Capillary: 135 mg/dL — ABNORMAL HIGH (ref 70–99)

## 2012-03-08 LAB — MRSA PCR SCREENING: MRSA by PCR: NEGATIVE

## 2012-03-08 LAB — BASIC METABOLIC PANEL
BUN: 69 mg/dL — ABNORMAL HIGH (ref 6–23)
CO2: 16 mEq/L — ABNORMAL LOW (ref 19–32)
Calcium: 9.6 mg/dL (ref 8.4–10.5)
Creatinine, Ser: 7.17 mg/dL — ABNORMAL HIGH (ref 0.50–1.10)
GFR calc non Af Amer: 6 mL/min — ABNORMAL LOW (ref >90)
Glucose, Bld: 210 mg/dL — ABNORMAL HIGH (ref 70–99)

## 2012-03-08 LAB — CARDIAC PANEL(CRET KIN+CKTOT+MB+TROPI)
CK, MB: 4.1 ng/mL — ABNORMAL HIGH (ref 0.3–4.0)
Relative Index: INVALID (ref 0.0–2.5)
Relative Index: INVALID (ref 0.0–2.5)
Relative Index: INVALID (ref 0.0–2.5)
Relative Index: INVALID (ref 0.0–2.5)
Total CK: 20 U/L (ref 7–177)
Total CK: 19 U/L (ref 7–177)
Total CK: 15 U/L (ref 7–177)
Troponin I: <0.3 ng/mL (ref <0.30)

## 2012-03-08 LAB — CBC
MCH: 31.8 pg (ref 26.0–34.0)
MCHC: 31.7 g/dL (ref 30.0–36.0)
MCV: 100.3 fL — ABNORMAL HIGH (ref 78.0–100.0)
Platelets: 305 10*3/uL (ref 150–400)
RBC: 2.86 MIL/uL — ABNORMAL LOW (ref 3.87–5.11)
RDW: 20.6 % — ABNORMAL HIGH (ref 11.5–15.5)

## 2012-03-08 MED ORDER — DARBEPOETIN ALFA-POLYSORBATE 150 MCG/0.3ML IJ SOLN
INTRAMUSCULAR | Status: AC
  Administered 2012-03-08: 150 ug via INTRAVENOUS
  Filled 2012-03-08: qty 0.3

## 2012-03-08 MED ORDER — LIDOCAINE-PRILOCAINE 2.5-2.5 % EX CREA: 1.0000 "application " | TOPICAL_CREAM | CUTANEOUS | Status: DC | PRN

## 2012-03-08 MED ORDER — ALTEPLASE 2 MG IJ SOLR: 2.0000 mg | Freq: Once | INTRAMUSCULAR | Status: AC | PRN

## 2012-03-08 MED ORDER — INSULIN ASPART 100 UNIT/ML ~~LOC~~ SOLN
0.0000 [IU] | Freq: Three times a day (TID) | SUBCUTANEOUS | Status: DC
  Administered 2012-03-08: 1 [IU] via SUBCUTANEOUS
  Administered 2012-03-08: 3 [IU] via SUBCUTANEOUS
  Administered 2012-03-09: 1 [IU] via SUBCUTANEOUS

## 2012-03-08 MED ORDER — SODIUM CHLORIDE 0.9 % IJ SOLN: 3.0000 mL | Freq: Two times a day (BID) | INTRAMUSCULAR | Status: DC

## 2012-03-08 MED ORDER — SEVELAMER CARBONATE 800 MG PO TABS
4000.0000 mg | ORAL_TABLET | Freq: Three times a day (TID) | ORAL | Status: DC
  Administered 2012-03-09 (×2): 4000 mg via ORAL
  Filled 2012-03-08 (×5): qty 5

## 2012-03-08 MED ORDER — SODIUM CHLORIDE 0.9 % IJ SOLN
3.0000 mL | Freq: Two times a day (BID) | INTRAMUSCULAR | Status: DC
  Administered 2012-03-08 – 2012-03-09 (×2): 3 mL via INTRAVENOUS

## 2012-03-08 MED ORDER — SODIUM CHLORIDE 0.9 % IJ SOLN: 3.0000 mL | INTRAMUSCULAR | Status: DC | PRN

## 2012-03-08 MED ORDER — PENTAFLUOROPROP-TETRAFLUOROETH EX AERO: 1.0000 "application " | INHALATION_SPRAY | CUTANEOUS | Status: DC | PRN

## 2012-03-08 MED ORDER — OXYCODONE HCL 5 MG PO TABS
ORAL_TABLET | ORAL | Status: AC
  Filled 2012-03-08: qty 1

## 2012-03-08 MED ORDER — SODIUM CHLORIDE 0.9 % IV SOLN: 100.0000 mL | INTRAVENOUS | Status: DC | PRN

## 2012-03-08 MED ORDER — HYDROMORPHONE HCL PF 1 MG/ML IJ SOLN: 0.5000 mg | INTRAMUSCULAR | Status: DC | PRN

## 2012-03-08 MED ORDER — ARIPIPRAZOLE 10 MG PO TABS
10.0000 mg | ORAL_TABLET | Freq: Every day | ORAL | Status: DC
  Administered 2012-03-08 – 2012-03-09 (×2): 10 mg via ORAL
  Filled 2012-03-08 (×2): qty 1

## 2012-03-08 MED ORDER — SODIUM CHLORIDE 0.9 % IV SOLN: 250.0000 mL | INTRAVENOUS | Status: DC | PRN

## 2012-03-08 MED ORDER — DARBEPOETIN ALFA-POLYSORBATE 150 MCG/0.3ML IJ SOLN
150.0000 ug | INTRAMUSCULAR | Status: DC
  Administered 2012-03-08: 150 ug via INTRAVENOUS

## 2012-03-08 MED ORDER — OXYCODONE HCL 5 MG PO TABS
5.0000 mg | ORAL_TABLET | ORAL | Status: DC | PRN
  Administered 2012-03-08 – 2012-03-09 (×5): 5 mg via ORAL
  Filled 2012-03-08 (×5): qty 1

## 2012-03-08 MED ORDER — ONDANSETRON HCL 4 MG PO TABS: 4.0000 mg | ORAL_TABLET | Freq: Four times a day (QID) | ORAL | Status: DC | PRN

## 2012-03-08 MED ORDER — NEPRO/CARBSTEADY PO LIQD: 237.0000 mL | ORAL | Status: DC | PRN

## 2012-03-08 MED ORDER — ACETAMINOPHEN 650 MG RE SUPP: 650.0000 mg | Freq: Four times a day (QID) | RECTAL | Status: DC | PRN

## 2012-03-08 MED ORDER — HEPARIN SODIUM (PORCINE) 1000 UNIT/ML DIALYSIS: 40.0000 [IU]/kg | INTRAMUSCULAR | Status: DC | PRN

## 2012-03-08 MED ORDER — INSULIN GLARGINE 100 UNIT/ML ~~LOC~~ SOLN
33.0000 [IU] | Freq: Every day | SUBCUTANEOUS | Status: DC
  Administered 2012-03-08: 33 [IU] via SUBCUTANEOUS

## 2012-03-08 MED ORDER — HEPARIN SODIUM (PORCINE) 1000 UNIT/ML DIALYSIS: 1000.0000 [IU] | INTRAMUSCULAR | Status: DC | PRN

## 2012-03-08 MED ORDER — ENOXAPARIN SODIUM 40 MG/0.4ML ~~LOC~~ SOLN
40.0000 mg | SUBCUTANEOUS | Status: DC
  Administered 2012-03-08 – 2012-03-09 (×2): 40 mg via SUBCUTANEOUS
  Filled 2012-03-08 (×2): qty 0.4

## 2012-03-08 MED ORDER — LIDOCAINE HCL (PF) 1 % IJ SOLN: 5.0000 mL | INTRAMUSCULAR | Status: DC | PRN

## 2012-03-08 MED ORDER — ONDANSETRON HCL 4 MG/2ML IJ SOLN: 4.0000 mg | Freq: Four times a day (QID) | INTRAMUSCULAR | Status: DC | PRN

## 2012-03-08 MED ORDER — INSULIN ASPART 100 UNIT/ML ~~LOC~~ SOLN: 0.0000 [IU] | Freq: Every day | SUBCUTANEOUS | Status: DC

## 2012-03-08 MED ORDER — NITROGLYCERIN 0.2 MG/HR TD PT24
0.2000 mg | MEDICATED_PATCH | Freq: Every day | TRANSDERMAL | Status: DC
  Filled 2012-03-08: qty 1

## 2012-03-08 MED ORDER — RENA-VITE PO TABS
1.0000 | ORAL_TABLET | ORAL | Status: DC
  Administered 2012-03-08: 1 via ORAL
  Filled 2012-03-08 (×2): qty 1

## 2012-03-08 MED ORDER — METOPROLOL TARTRATE 1 MG/ML IV SOLN
INTRAVENOUS | Status: AC
  Filled 2012-03-08: qty 5

## 2012-03-08 MED ORDER — CINACALCET HCL 30 MG PO TABS
30.0000 mg | ORAL_TABLET | Freq: Every day | ORAL | Status: DC
  Administered 2012-03-09: 30 mg via ORAL
  Filled 2012-03-08 (×3): qty 1

## 2012-03-08 MED ORDER — ZOLPIDEM TARTRATE 5 MG PO TABS: 5.0000 mg | ORAL_TABLET | Freq: Every evening | ORAL | Status: DC | PRN

## 2012-03-08 MED ORDER — ACETAMINOPHEN 325 MG PO TABS: 650.0000 mg | ORAL_TABLET | Freq: Four times a day (QID) | ORAL | Status: DC | PRN

## 2012-03-08 MED ORDER — NICOTINE 21 MG/24HR TD PT24
21.0000 mg | MEDICATED_PATCH | Freq: Every day | TRANSDERMAL | Status: DC
  Administered 2012-03-08 – 2012-03-09 (×2): 21 mg via TRANSDERMAL
  Filled 2012-03-08 (×2): qty 1

## 2012-03-08 NOTE — Progress Notes (Signed)
Patient has order for BID wound care to [facility], patient also has wounds to bil groin, family states that she has [facility] RN wound care BID at home.

## 2012-03-08 NOTE — Consult Note (Signed)
Reason for Consult: Continuity of ESRD care Referring Physician: Brendia Sacks MD- [facility]   HPI: 41 year old Caucasian woman with a history of ESRD on HD (TTS- [address] [place]) who presented here for evaluation of chest pain with transiently associated shortness of breath. No radiation, palpitations or diaphoresis noted in association with her chest pain. She had some non productive cough with minimal sputum but denies fevers/chills.  Dialysis Prescription: EDW 80Kg, 4h , 2K/2.5Ca, QB 350/ QD 600, Left IJ CVL. Epogen 8800u TIW, Venofer 100 TIW  Past Medical History  Diagnosis Date  . DVT (deep venous thrombosis)     DVT HISTORY  . Diabetes mellitus   . Migraine     HISTORY  . Anxiety and depression   . CAD (coronary artery disease)     Last cath 2009:  LAD stent 80% stenosis. Circumflex 60-70% stenosis AV groove, right coronary artery 50-60% stenosis. She did have cutting balloon angioplasty of the LAD lesion into a diagonal. However, there was restenosis of this and it was managed medically.  . Bipolar 1 disorder   . Hyperlipidemia   . Hyperparathyroidism   . PE (pulmonary embolism)     HISTORY  . Dialysis patient   . Anemia   . Renal failure     dialysis [place] t,th sat  . Nephrotic syndrome   . Peripheral vascular disease   . MRSA infection   . Anxiety   . Depression   . Gastroparesis   . Complication of anesthesia   . PONV (postoperative nausea and vomiting)     x3 days post anesth. on 12/19/2011  . Myocardial infarction     x3 , last one - 10 yrs. ago  . Hypertension     sees Dr. Antoine Poche - [place], last stress test 11/2011, see result in EPIC, saw Dr. Antoine Poche as well at that time    . Shortness of breath   . Recurrent upper respiratory infection (URI)     Bronchitis 11/2011 - saw Dr. Loney Hering, treating currently /w antibiotic   . GERD (gastroesophageal reflux disease)   . Pneumonia     [facility]- 2012  . Noncompliance of patient with renal dialysis     Past  Surgical History  Procedure Date  . Incise and drain abcess     OF THIGHS FROM INSULIN INJECTIONS  . Heart stents   . Portacath placement 2003  . Cataract extraction w/phaco 11/09/2011    Procedure: CATARACT EXTRACTION PHACO AND INTRAOCULAR LENS PLACEMENT (IOC);  Surgeon: Edmon Crape, MD;  Location: [facility] OR;  Service: Ophthalmology;  Laterality: Right;  . Pars plana vitrectomy 11/09/2011    Procedure: PARS PLANA VITRECTOMY WITH 23 GAUGE;  Surgeon: Edmon Crape, MD;  Location: [facility] OR;  Service: Ophthalmology;  Laterality: Right;  Ahmed Valve; Scleral Reinforcement Graft  . Dg av dialysis  shunt access exist*l* or 12/13/11    left arm  . Dialysis cath inserted & portacath      dialysis catheter inserted & portacath d/c'd- 12/19/2011  . Back surgery X 4    x4 times   . Cholecystectomy   . Eye surgery   . Cardiac catheterization   . Aorta - bilateral femoral artery bypass graft 12/28/2011    Procedure: AORTA BIFEMORAL BYPASS GRAFT;  Surgeon: Nada Libman, MD;  Location: [facility] OR;  Service: Vascular;  Laterality: N/A;  AortoBifemoral Bypass using hemasheild 14x7 graft  . Femoral-popliteal bypass graft 12/28/2011    Procedure: BYPASS GRAFT FEMORAL-POPLITEAL ARTERY;  Surgeon: Nada Libman, MD;  Location: [facility] OR;  Service: Vascular;  Laterality: Right;  right femoral popliteal bypass using 6x80 propaten graft  . Insertion of dialysis catheter 02/15/2012    Procedure: INSERTION OF DIALYSIS CATHETER;  Surgeon: Chuck Hint, MD;  Location: [facility] OR;  Service: Vascular;  Laterality: Right;  insertion of dialysis catheter right internal jugular vein,removal of left internal jugular vein dialysis catheter  . Insertion of dialysis catheter 02/21/2012    Procedure: INSERTION OF DIALYSIS CATHETER;  Surgeon: Chuck Hint, MD;  Location: [facility] OR;  Service: Cardiovascular;  Laterality: N/A;  Insertion new dialysis catheter; possible venoplasty of fibrin sheath of  Right IJ catheter  . Venogram 02/21/2012      Procedure: VENOGRAM;  Surgeon: Chuck Hint, MD;  Location: [facility] OR;  Service: Cardiovascular;  Laterality: N/A;  possible venoplasty    Family History  Problem Relation Age of Onset  . Transient ischemic attack Mother   . Hepatitis Sister   . Diabetes type II Other   . Anesthesia problems Neg Hx   . Hypotension Neg Hx   . Malignant hyperthermia Neg Hx   . Pseudochol deficiency Neg Hx     Social History:  reports that she has been smoking Cigarettes.  She has a 40 pack-year smoking history. She has never used smokeless tobacco. She reports that she does not drink alcohol or use illicit drugs.  Allergies:  Allergies  Allergen Reactions  . Compazine (Prochlorperazine) Swelling    Per echart records  . Contrast Media (Iodinated Diagnostic Agents)     Per echart records  . Meperidine Hcl Swelling  . Metformin And Related Other (See Comments)    'just not myself, was told not to take metformin'  . Morphine Swelling  . Nubain (Nalbuphine Hcl) Swelling  . Tobramycin   . Topiramate (Topamax) Swelling    Tongue swelling - per echart records  . Toradol (Ketorolac Tromethamine) Swelling    Medications:  Scheduled:   . ARIPiprazole  10 mg Oral Daily  . clopidogrel  75 mg Oral Q breakfast  . enoxaparin (LOVENOX) injection  40 mg Subcutaneous Q24H  . insulin aspart  0-5 Units Subcutaneous QHS  . insulin aspart  0-9 Units Subcutaneous TID WC  . insulin glargine  33 Units Subcutaneous QHS  . metoprolol      . nicotine  21 mg Transdermal Daily  . sodium chloride  3 mL Intravenous Q12H  . DISCONTD: sodium chloride   Intravenous STAT  . DISCONTD: nitroGLYCERIN  0.2 mg Transdermal Daily  . DISCONTD: sodium chloride  3 mL Intravenous Q12H    Results for orders placed during the hospital encounter of 03/07/12 (from the past 48 hour(s))  POCT I-STAT TROPONIN I     Status: Normal   Collection Time   03/07/12  2:18 PM      Component Value Range Comment   Troponin i, poc 0.03   0.00 - 0.08 ng/mL    Comment 3            CBC WITH DIFFERENTIAL     Status: Abnormal   Collection Time   03/07/12  2:19 PM      Component Value Range Comment   WBC 11.1 (*) 4.0 - 10.5 K/uL    RBC 2.89 (*) 3.87 - 5.11 MIL/uL    Hemoglobin 9.3 (*) 12.0 - 15.0 g/dL    HCT 16.1 (*) 09.6 - 46.0 %    MCV 100.3 (*) 78.0 - 100.0  fL    MCH 32.2  26.0 - 34.0 pg    MCHC 32.1  30.0 - 36.0 g/dL    RDW 16.1 (*) 09.6 - 15.5 %    Platelets 312  150 - 400 K/uL    Neutrophils Relative 82 (*) 43 - 77 %    Neutro Abs 9.1 (*) 1.7 - 7.7 K/uL    Lymphocytes Relative 9 (*) 12 - 46 %    Lymphs Abs 1.0  0.7 - 4.0 K/uL    Monocytes Relative 6  3 - 12 %    Monocytes Absolute 0.7  0.1 - 1.0 K/uL    Eosinophils Relative 2  0 - 5 %    Eosinophils Absolute 0.2  0.0 - 0.7 K/uL    Basophils Relative 1  0 - 1 %    Basophils Absolute 0.1  0.0 - 0.1 K/uL   COMPREHENSIVE METABOLIC PANEL     Status: Abnormal   Collection Time   03/07/12  2:19 PM      Component Value Range Comment   Sodium 130 (*) 135 - 145 mEq/L    Potassium 3.8  3.5 - 5.1 mEq/L    Chloride 91 (*) 96 - 112 mEq/L    CO2 20  19 - 32 mEq/L    Glucose, Bld 266 (*) 70 - 99 mg/dL    BUN 63 (*) 6 - 23 mg/dL    Creatinine, Ser 0.45 (*) 0.50 - 1.10 mg/dL    Calcium 40.9  8.4 - 10.5 mg/dL    Total Protein 6.9  6.0 - 8.3 g/dL    Albumin 2.4 (*) 3.5 - 5.2 g/dL    AST 11  0 - 37 U/L    ALT 9  0 - 35 U/L    Alkaline Phosphatase 191 (*) 39 - 117 U/L    Total Bilirubin 0.1 (*) 0.3 - 1.2 mg/dL    GFR calc non Af Amer 6 (*) >90 mL/min    GFR calc Af Amer 7 (*) >90 mL/min   PROTIME-INR     Status: Abnormal   Collection Time   03/07/12  2:19 PM      Component Value Range Comment   Prothrombin Time 16.0 (*) 11.6 - 15.2 seconds    INR 1.25  0.00 - 1.49   APTT     Status: Normal   Collection Time   03/07/12  3:35 PM      Component Value Range Comment   aPTT 33  24 - 37 seconds   WOUND CULTURE     Status: Normal (Preliminary result)   Collection Time    03/07/12 11:12 PM      Component Value Range Comment   Specimen Description WOUND RIGHT ANKLE      Special Requests RULE OUT MRSA      Gram Stain        Value: NO WBC SEEN     RARE SQUAMOUS EPITHELIAL CELLS PRESENT     NO ORGANISMS SEEN   Culture PENDING      Report Status PENDING     CARDIAC PANEL(CRET KIN+CKTOT+MB+TROPI)     Status: Abnormal   Collection Time   03/07/12 11:54 PM      Component Value Range Comment   Total CK 25  7 - 177 U/L    CK, MB 4.4 (*) 0.3 - 4.0 ng/mL    Troponin I <0.30  <0.30 ng/mL    Relative Index RELATIVE INDEX IS INVALID  0.0 - 2.5  D-DIMER, QUANTITATIVE     Status: Abnormal   Collection Time   03/07/12 11:54 PM      Component Value Range Comment   D-Dimer, Quant 3.54 (*) 0.00 - 0.48 ug/mL-FEU   CARDIAC PANEL(CRET KIN+CKTOT+MB+TROPI)     Status: Abnormal   Collection Time   03/08/12  1:13 AM      Component Value Range Comment   Total CK 20  7 - 177 U/L    CK, MB 4.3 (*) 0.3 - 4.0 ng/mL    Troponin I <0.30  <0.30 ng/mL    Relative Index RELATIVE INDEX IS INVALID  0.0 - 2.5   CARDIAC PANEL(CRET KIN+CKTOT+MB+TROPI)     Status: Abnormal   Collection Time   03/08/12  6:30 AM      Component Value Range Comment   Total CK 19  7 - 177 U/L    CK, MB 4.1 (*) 0.3 - 4.0 ng/mL    Troponin I <0.30  <0.30 ng/mL    Relative Index RELATIVE INDEX IS INVALID  0.0 - 2.5   BASIC METABOLIC PANEL     Status: Abnormal   Collection Time   03/08/12  6:30 AM      Component Value Range Comment   Sodium 130 (*) 135 - 145 mEq/L    Potassium 3.9  3.5 - 5.1 mEq/L    Chloride 91 (*) 96 - 112 mEq/L    CO2 16 (*) 19 - 32 mEq/L    Glucose, Bld 210 (*) 70 - 99 mg/dL    BUN 69 (*) 6 - 23 mg/dL    Creatinine, Ser 1.61 (*) 0.50 - 1.10 mg/dL    Calcium 9.6  8.4 - 09.6 mg/dL    GFR calc non Af Amer 6 (*) >90 mL/min    GFR calc Af Amer 7 (*) >90 mL/min   CBC     Status: Abnormal   Collection Time   03/08/12  6:30 AM      Component Value Range Comment   WBC 10.6 (*) 4.0 - 10.5  K/uL    RBC 2.86 (*) 3.87 - 5.11 MIL/uL    Hemoglobin 9.1 (*) 12.0 - 15.0 g/dL    HCT 04.5 (*) 40.9 - 46.0 %    MCV 100.3 (*) 78.0 - 100.0 fL    MCH 31.8  26.0 - 34.0 pg    MCHC 31.7  30.0 - 36.0 g/dL    RDW 81.1 (*) 91.4 - 15.5 %    Platelets 305  150 - 400 K/uL   GLUCOSE, CAPILLARY     Status: Abnormal   Collection Time   03/08/12  6:44 AM      Component Value Range Comment   Glucose-Capillary 204 (*) 70 - 99 mg/dL   GLUCOSE, CAPILLARY     Status: Abnormal   Collection Time   03/08/12 11:06 AM      Component Value Range Comment   Glucose-Capillary 135 (*) 70 - 99 mg/dL    Comment 1 Notify RN       US Venous Img Lower Bilateral  03/07/2012  *RADIOLOGY REPORT*  Clinical Data: Bilateral lower extremity edema; post aortobifemoral bypass grafting 3 weeks ago  VENOUS DUPLEX ULTRASOUND OF BILATERAL LOWER EXTREMITIES  Technique:  Gray-scale sonography with graded compression, as well as color Doppler and duplex ultrasound, were performed to evaluate the deep venous system of both lower extremities from the level of the common femoral vein through the popliteal and proximal calf veins.  Spectral Doppler  was utilized to evaluate flow at rest and with distal augmentation maneuvers. The common femoral veins are inadequately visualized due to the surgical dressings at these regions.  Comparison:  02/24/2010  Findings:  The deep venous systems appear patent and compressible at the profunda femoral, femoral, popliteal veins.  Common femoral veins are not adequately visualized due to dressings at inguinal regions bilaterally from prior surgery.  The saphenofemoral junctions are not adequately visualized.  No evidence of deep venous thrombus identified within the visualized segments. Spontaneous venous flow is present with intact augmentation. Large hypoechoic collection identified at the proximal right thigh, 6.5 x 3.0 x 5.0 cm in size, question postoperative lymphocele, seroma or resolving hematoma.   IMPRESSION: Unable to adequately assess the common femoral veins and saphenofemoral junctions due to dressings. Postoperative fluid collection at the proximal left thigh 6.5 x 3.0 x 5.0 cm in size question lymphocele, seroma or resolving hematoma. No evidence of deep venous thrombosis is within the visualized portions of the deep venous systems of the lower extremities as above.  Original Report Authenticated By: Lollie Marrow, M.D.   Dg Chest Port 1 View  03/07/2012  *RADIOLOGY REPORT*  Clinical Data: Chest pain  PORTABLE CHEST - 1 VIEW  Comparison: 03/06/2012; 02/21/2012; 02/15/2012  Findings:  Grossly unchanged enlarged cardiac silhouette and mediastinal contours.  Stable positioning of support apparatus with dialysis catheter tips overlying the central location of the right atrium. Grossly unchanged bibasilar heterogeneous opacities, right greater than left.  There is chronic blunting of the right costophrenic angle.  No definite pneumothorax.  Unchanged bones. Left axillary vascular stent.  IMPRESSION:  1.  No acute cardiopulmonary disease. 2.  Chronic blunting of the right costophrenic angle with bibasilar heterogeneous opacities, right greater than left, favored to represent atelectasis.  Original Report Authenticated By: Waynard Reeds, M.D.    Review of Systems  Constitutional: Positive for malaise/fatigue. Negative for fever, chills, weight loss and diaphoresis.  HENT: Negative.  Negative for neck pain.   Eyes: Negative.   Respiratory: Positive for cough and shortness of breath. Negative for hemoptysis, sputum production and wheezing.   Cardiovascular: Positive for chest pain and palpitations. Negative for orthopnea, claudication, leg swelling and PND.  Gastrointestinal: Positive for nausea. Negative for heartburn, vomiting, abdominal pain, diarrhea, constipation, blood in stool and melena.  Genitourinary: Negative.   Musculoskeletal: Positive for back pain. Negative for myalgias, joint pain  and falls.  Skin: Negative.   Neurological: Positive for dizziness and weakness. Negative for tingling, tremors, sensory change, speech change, focal weakness, seizures and loss of consciousness.  Endo/Heme/Allergies: Negative.   All other systems reviewed and are negative.   Blood pressure 139/77, pulse 88, temperature 97.5 F (36.4 C), temperature source Oral, resp. rate 18, height 5\' 10"  (1.778 m), weight 90 kg (198 lb 6.6 oz), last menstrual period 09/03/2008, SpO2 96.00%. Physical Exam  Nursing note and vitals reviewed. Constitutional: She is oriented to person, place, and time. She appears well-developed and well-nourished.  HENT:  Head: Normocephalic and atraumatic.  Nose: Nose normal.  Mouth/Throat: Oropharynx is clear and moist.  Eyes: Conjunctivae and EOM are normal. Pupils are equal, round, and reactive to light. No scleral icterus.  Neck: Normal range of motion. Neck supple. No JVD present. No tracheal deviation present. No thyromegaly present.  Cardiovascular: Normal rate, regular rhythm and normal heart sounds.  Exam reveals no gallop and no friction rub.   No murmur heard. Respiratory: Effort normal. No stridor. No respiratory distress. She  has wheezes. She has rales. She exhibits tenderness.  GI: Soft. Bowel sounds are normal. She exhibits no distension and no mass. There is tenderness. There is no rebound and no guarding.       Epigastric tenderness without rebound  Musculoskeletal: Normal range of motion. She exhibits edema. She exhibits no tenderness.  Lymphadenopathy:    She has no cervical adenopathy.  Neurological: She is alert and oriented to person, place, and time. No cranial nerve deficit. Coordination normal.  Skin: Skin is warm and dry. No rash noted. No erythema. No pallor.  Psychiatric: Thought content normal.       Flat affect.     Assessment/Plan: 1. Chest Pain: atypical for anginal pain- enzymes negative for ACS and cardiology not planning on further  work-up. 2. ESRD: plan for scheduled HD today- she currently appears 10 kg above her EDW of 80kg. 3. Anemia: Restart ESA, no overt loss noted and no indications for PRBCs. 4. Metabolic Bone Disease: Will reconcile phosphorus binders and VDRAs 5. AMS: mentation appears to be back to her baseline- currently oriented to place/person and vaguely to time.   Byrd Rushlow K. 03/08/2012, 11:50 AM

## 2012-03-08 NOTE — Procedures (Signed)
Patient seen on Hemodialysis. QB 300, UF goal 8800 Treatment adjusted as needed.  Zetta Bills MD [facility]. Office # [phone] Pager # [phone] 4:16 PM

## 2012-03-08 NOTE — Progress Notes (Signed)
[facility] PROGRESS NOTE  AVIS TIRONE ZOX:[id number] DOB: [birth year] DOA: 03/07/2012 PCP: Ernestine Conrad, MD Cardiologist: Dietrich Pates, MD  Assessment/Plan: 1. Chest pain, history coronary artery disease: Clinically resolved. Troponins negative. Cardiology has signed off and recommends no further evaluation. Continue Plavix. 2. Acute encephalopathy/somulence: Resolved. Review of records demonstrates frequent oversedation secondary to narcotics and benzodiazepines. Patient alert and oriented. See no reason to pursue head CT ordered by cardiology. 3. Elevated d-dimer: Ordered by cardiology. Clinical suspicion for PE is low at this point. Given the patient's chronic comorbidities and history of surgery in the past the clinical utility of this test is questionable. Patient has had no tachycardia (not on any rate control agents), no hemodynamic instability and no hypoxia. At this point I think there is little reason to suspect pulmonary embolism but given her history and positive testing we will pursue V/Q scan. No therapeutic anticoagulants unless positive. 4. End-stage renal disease: T, T, S. Nephrology consulted. 5. Wound right ankle: Present on admission. Followup as an outpatient. 6. Diabetes mellitus: Stable. Resume Lantus and continue sliding scale insulin. 7. History of DVT, PE: Not on anticoagulation as an outpatient. No reason to suspect recurrence. 8. 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 Ultrasound noted--suspect seroma. Follow-up with vascular surgery as an outpatient. 9. Cigarette smoker: nicotine patch  Patient was transferred to [facility] under IVC (basis for order unclear but was obtained by petition of family)--patient alert and oriented. I have requested psychiatry consultation for capacity assessment and patient has refused dialysis and hospitalization in the recent  past. Discussed recommendations with patient and she was agreeable to staying in the hospital.  Code Status: Full code Family Communication: Disposition Plan: Home when improved  Brendia Sacks, MD  [facility] Pager [phone]. If 8PM-8AM, please contact night-coverage at [email or web address], password [facility] 03/08/2012, 2:29 PM  LOS: 1 day   Brief narrative: 41 year old woman-- -Seen Emergency Department 7/12 at [facility] for chest pain. Accepted by Dr. Tenny Craw at [facility]. Patient refused transfer. [place] [employer] served [name]. Subsequently it was requested that the hospitalist service admit her. -24 pounds above weight secondary to history of intermittent dialysis refusal and dialysis center refusal to dialyze because of chest pain. -Seen in the emergency department 7/11 her chest pain. Patient left AGAINST MEDICAL ADVICE.  Consultants:  Nephrology  Cardiology  Psychiatry  Procedures:  Routine hemodialysis  HPI/Subjective: No chest pain, no shortness of breath. No complaints.  Objective: Filed Vitals:   03/07/12 1942 03/07/12 2120 03/08/12 0341 03/08/12 0646  BP: 160/82 167/85 147/83 139/77  Pulse: 87 89 94 88  Temp:  97.6 F (36.4 C) 97.5 F (36.4 C)   TempSrc:  Oral Oral   Resp: 18 18 18    Height:      Weight:  90 kg (198 lb 6.6 oz)    SpO2: 95% 98% 96%    No intake or output data in the 24 hours ending 03/08/12 1429  Exam:   General:  Appears calm and comfortable. Speech fluent and clear. Alert. Oriented to self, [facility], month, year.  Cardiovascular: RRR, no m/r/g. Minimal lower extremity edema.  Respiratory: CTA bilaterally, no w/r/r. Normal respiratory effort.  Abdomen: Soft, nt, nd.  Data Reviewed: Basic Metabolic Panel:  Lab 03/08/12 8295 03/07/12 1419 03/06/12 0942  NA 130* 130* 130*  K 3.9 3.8 3.7  CL 91* 91* 91*  CO2 16* 20 18*  GLUCOSE 210* 266* 219*  BUN 69* 63* 79*  CREATININE 7.17* 7.15* 8.57*  CALCIUM  9.6 10.2 10.1  MG -- -- --  PHOS -- -- --   Liver Function Tests:  Lab 03/07/12 1419  AST 11  ALT 9  ALKPHOS 191*  BILITOT 0.1*  PROT 6.9  ALBUMIN 2.4*   CBC:  Lab 03/08/12 0630 03/07/12 1419 03/06/12 0942  WBC 10.6* 11.1* 12.4*  NEUTROABS -- 9.1* 10.7*  HGB 9.1* 9.3* 9.1*  HCT 28.7* 29.0* 28.4*  MCV 100.3* 100.3* 101.1*  PLT 305 312 311   Cardiac Enzymes:  Lab 03/08/12 0630 03/08/12 0113 03/07/12 2354 03/06/12 0942  CKTOTAL 19 20 25  --  CKMB 4.1* 4.3* 4.4* --  CKMBINDEX -- -- -- --  TROPONINI <0.30 <0.30 <0.30 <0.30    Basename 02/14/12 1447 08/20/11 1010  PROBNP 13271.0* 4201.0*   CBG:  Lab 03/08/12 1106 03/08/12 0644  GLUCAP 135* 204*   Recent Results (from the past 240 hour(s))  WOUND CULTURE     Status: Normal (Preliminary result)   Collection Time   03/07/12 11:12 PM      Component Value Range Status Comment   Specimen Description WOUND RIGHT ANKLE   Final    Special Requests RULE OUT MRSA   Final    Gram Stain     Final    Value: NO WBC SEEN     RARE SQUAMOUS EPITHELIAL CELLS PRESENT     NO ORGANISMS SEEN   Culture PENDING   Incomplete    Report Status PENDING   Incomplete   MRSA PCR SCREENING     Status: Normal   Collection Time   03/08/12 12:03 PM      Component Value Range Status Comment   MRSA by PCR NEGATIVE  NEGATIVE Final     Studies: US Venous Img Lower Bilateral  03/07/2012  *RADIOLOGY REPORT* IMPRESSION: Unable to adequately assess the common femoral veins and saphenofemoral junctions due to dressings. Postoperative fluid collection at the proximal left thigh 6.5 x 3.0 x 5.0 cm in size question lymphocele, seroma or resolving hematoma. No evidence of deep venous thrombosis is within the visualized portions of the deep venous systems of the lower extremities as above.  Original Report Authenticated By: Lollie Marrow, M.D.   Dg Chest Port 1 View  03/07/2012  *RADIOLOGY REPORT*   IMPRESSION:  1.  No acute cardiopulmonary disease. 2.   Chronic blunting of the right costophrenic angle with bibasilar heterogeneous opacities, right greater than left, favored to represent atelectasis.  Original Report Authenticated By: Waynard Reeds, M.D.   Scheduled Meds:    . ARIPiprazole  10 mg Oral Daily  . cinacalcet  30 mg Oral Q breakfast  . clopidogrel  75 mg Oral Q breakfast  . darbepoetin (ARANESP) injection - DIALYSIS  150 mcg Intravenous Q Sat-HD  . enoxaparin (LOVENOX) injection  40 mg Subcutaneous Q24H  . insulin aspart  0-5 Units Subcutaneous QHS  . insulin aspart  0-9 Units Subcutaneous TID WC  . insulin glargine  33 Units Subcutaneous QHS  . metoprolol      . multivitamin  1 tablet Oral Q24H  . nicotine  21 mg Transdermal Daily  . sevelamer  4,000 mg Oral TID WC  . sodium chloride  3 mL Intravenous Q12H  . DISCONTD: sodium chloride   Intravenous STAT  . DISCONTD: nitroGLYCERIN  0.2 mg Transdermal Daily  . DISCONTD: sodium chloride  3 mL Intravenous Q12H  Continuous Infusions:    . DISCONTD: sodium chloride 40 mL/hr (03/07/12 1540)    Principal Problem:  *Chest pain Active Problems:  DIABETES  HYPERTENSION, UNSPECIFIED  CAD, NATIVE VESSEL  ESRD on dialysis  PVD (peripheral vascular disease)  Tobacco abuse  Diabetic leg ulcer     Brendia Sacks, MD  [facility] Pager [phone]. If 8PM-8AM, please contact night-coverage at [email or web address], password [facility] 03/08/2012, 2:29 PM  LOS: 1 day

## 2012-03-08 NOTE — Progress Notes (Signed)
Patient refusing VQ scan. Encouraged and explained risk. Patient still refusing.

## 2012-03-08 NOTE — H&P (Signed)
DATE OF ADMISSION:  03/08/2012  PCP:    Ernestine Conrad, MD   Chief Complaint: Chest Pain   HPI: [name] is an 41 y.o. female with a complex medical history, which includes CAD, and ESRD on HD,  who presented to [facility] ED with complaints of intermittent sharp left sided chest pain for the past 2-3 days.  She denies having nausea vomiting or diaphoresis.  She is currently painfree, but when she had the pain she ranked the pain as a 7/10.   She denies cough SOB, fevers or chills.    Past Medical History  Diagnosis Date  . DVT (deep venous thrombosis)     DVT HISTORY  . Diabetes mellitus   . Migraine     HISTORY  . Anxiety and depression   . CAD (coronary artery disease)     Last cath 2009:  LAD stent 80% stenosis. Circumflex 60-70% stenosis AV groove, right coronary artery 50-60% stenosis. She did have cutting balloon angioplasty of the LAD lesion into a diagonal. However, there was restenosis of this and it was managed medically.  . Bipolar 1 disorder   . Hyperlipidemia   . Hyperparathyroidism   . PE (pulmonary embolism)     HISTORY  . Dialysis patient   . Anemia   . Renal failure     dialysis [place] t,th sat  . Nephrotic syndrome   . Peripheral vascular disease   . MRSA infection   . Anxiety   . Depression   . Gastroparesis   . Complication of anesthesia   . PONV (postoperative nausea and vomiting)     x3 days post anesth. on 12/19/2011  . Myocardial infarction     x3 , last one - 10 yrs. ago  . Hypertension     sees Dr. Antoine Poche - [place], last stress test 11/2011, see result in EPIC, saw Dr. Antoine Poche as well at that time    . Shortness of breath   . Recurrent upper respiratory infection (URI)     Bronchitis 11/2011 - saw Dr. Loney Hering, treating currently /w antibiotic   . GERD (gastroesophageal reflux disease)   . Pneumonia     [facility]- 2012  . Noncompliance of patient with renal dialysis     Past Surgical History  Procedure Date  . Incise and drain abcess     OF  THIGHS FROM INSULIN INJECTIONS  . Heart stents   . Portacath placement 2003  . Cataract extraction w/phaco 11/09/2011    Procedure: CATARACT EXTRACTION PHACO AND INTRAOCULAR LENS PLACEMENT (IOC);  Surgeon: Edmon Crape, MD;  Location: [facility] OR;  Service: Ophthalmology;  Laterality: Right;  . Pars plana vitrectomy 11/09/2011    Procedure: PARS PLANA VITRECTOMY WITH 23 GAUGE;  Surgeon: Edmon Crape, MD;  Location: [name] OR;  Service: Ophthalmology;  Laterality: Right;  Ahmed Valve; Scleral Reinforcement Graft  . Dg av dialysis  shunt access exist*l* or 12/13/11    left arm  . Dialysis cath inserted & portacath      dialysis catheter inserted & portacath d/c'd- 12/19/2011  . Back surgery X 4    x4 times   . Cholecystectomy   . Eye surgery   . Cardiac catheterization   . Aorta - bilateral femoral artery bypass graft 12/28/2011    Procedure: AORTA BIFEMORAL BYPASS GRAFT;  Surgeon: Nada Libman, MD;  Location: [facility] OR;  Service: Vascular;  Laterality: N/A;  AortoBifemoral Bypass using hemasheild 14x7 graft  . Femoral-popliteal bypass graft 12/28/2011  Procedure: BYPASS GRAFT FEMORAL-POPLITEAL ARTERY;  Surgeon: Nada Libman, MD;  Location: [facility] OR;  Service: Vascular;  Laterality: Right;  right femoral popliteal bypass using 6x80 propaten graft  . Insertion of dialysis catheter 02/15/2012    Procedure: INSERTION OF DIALYSIS CATHETER;  Surgeon: Chuck Hint, MD;  Location: [facility] OR;  Service: Vascular;  Laterality: Right;  insertion of dialysis catheter right internal jugular vein,removal of left internal jugular vein dialysis catheter  . Insertion of dialysis catheter 02/21/2012    Procedure: INSERTION OF DIALYSIS CATHETER;  Surgeon: Chuck Hint, MD;  Location: [facility] OR;  Service: Cardiovascular;  Laterality: N/A;  Insertion new dialysis catheter; possible venoplasty of fibrin sheath of  Right IJ catheter  . Venogram 02/21/2012    Procedure: VENOGRAM;  Surgeon: Chuck Hint, MD;   Location: [facility] OR;  Service: Cardiovascular;  Laterality: N/A;  possible venoplasty    Medications:  HOME MEDS: Prior to Admission medications   Medication Sig Start Date End Date Taking? Authorizing Provider  albuterol (PROVENTIL HFA;VENTOLIN HFA) 108 (90 BASE) MCG/ACT inhaler Inhale 2 puffs into the lungs every 6 (six) hours as needed. Shortness of breath   Yes Historical Provider, MD  ALPRAZolam (XANAX) 1 MG tablet Take 1 mg by mouth 3 (three) times daily as needed. For anxiety   Yes Historical Provider, MD  ARIPiprazole (ABILIFY) 2 MG tablet Take 10 mg by mouth daily.    Yes Historical Provider, MD  b complex-vitamin c-folic acid (NEPHRO-VITE) 0.8 MG TABS Take 0.8 mg by mouth at bedtime.   Yes Historical Provider, MD  cinacalcet (SENSIPAR) 30 MG tablet Take 30 mg by mouth daily with supper.    Yes Historical Provider, MD  clopidogrel (PLAVIX) 75 MG tablet Take 75 mg by mouth daily. Last dose 12/17/2011   Yes Historical Provider, MD  darbepoetin (ARANESP) 60 MCG/0.3ML SOLN Inject 0.3 mLs (60 mcg total) into the vein every Saturday with hemodialysis. 02/23/12  Yes Lonia Blood, MD  Eyelid Cleansers (SYSTANE LID WIPES) PADS Place 1 application into the right eye daily.   Yes Historical Provider, MD  hydroxypropyl methylcellulose (ISOPTO TEARS) 2.5 % ophthalmic solution Place 1 drop into both eyes 3 (three) times daily as needed. Dry eyes   Yes Historical Provider, MD  insulin glargine (LANTUS) 100 UNIT/ML injection Inject 33 Units into the skin at bedtime.    Yes Historical Provider, MD  insulin lispro (HUMALOG) 100 UNIT/ML injection Inject 7-15 Units into the skin 4 (four) times daily -  before meals and at bedtime. TAKING ON SLIDING SCALE 200-249=7 units 250-299=10 units 300-349=12 units >350=15 units   Yes Historical Provider, MD  ofloxacin (OCUFLOX) 0.3 % ophthalmic solution Place 1 drop into the right eye 3 (three) times daily.    Yes Historical Provider, MD  omeprazole (PRILOSEC) 40  MG capsule Take 40 mg by mouth daily.  11/12/11  Yes Malissa Hippo, MD  PARoxetine (PAXIL) 40 MG tablet Take 80 mg by mouth every morning.    Yes Historical Provider, MD  pyridoxine (B-6) 100 MG tablet Take 100 mg by mouth daily.   Yes Historical Provider, MD  SANTYL ointment APPLY DAILY 02/25/12  Yes Fransisco Hertz, MD  sevelamer (RENVELA) 800 MG tablet Take 1,600-4,000 mg by mouth 5 (five) times daily. *Take 5 tablets daily with each meal and take 2 tablets with snacks**   Yes Historical Provider, MD    Allergies:  Allergies  Allergen Reactions  . Compazine (Prochlorperazine) Swelling  Per echart records  . Contrast Media (Iodinated Diagnostic Agents)     Per echart records  . Meperidine Hcl Swelling  . Metformin And Related Other (See Comments)    'just not myself, was told not to take metformin'  . Morphine Swelling  . Nubain (Nalbuphine Hcl) Swelling  . Tobramycin   . Topiramate (Topamax) Swelling    Tongue swelling - per echart records  . Toradol (Ketorolac Tromethamine) Swelling    Social History:   reports that she has been smoking Cigarettes.  She has a 40 pack-year smoking history. She has never used smokeless tobacco. She reports that she does not drink alcohol or use illicit drugs.  Family History: Family History  Problem Relation Age of Onset  . Transient ischemic attack Mother   . Hepatitis Sister   . Diabetes type II Other   . Anesthesia problems Neg Hx   . Hypotension Neg Hx   . Malignant hyperthermia Neg Hx   . Pseudochol deficiency Neg Hx     Review of Systems:  + peripheral edema  The patient denies anorexia, fever, weight loss, vision loss, decreased hearing, hoarseness, syncope, dyspnea on exertion, , balance deficits, hemoptysis, abdominal pain, melena, hematochezia, severe indigestion/heartburn, hematuria, incontinence, genital sores, muscle weakness, suspicious skin lesions, transient blindness, difficulty walking, depression, unusual weight change,  abnormal bleeding, enlarged lymph nodes, angioedema, and breast masses.   Physical Exam:  GEN:    examined  and in no acute distress; cooperative with exam Filed Vitals:   03/07/12 1900 03/07/12 1942 03/07/12 2120 03/08/12 0341  BP: 160/82 160/82 167/85 147/83  Pulse:  87 89 94  Temp:   97.6 F (36.4 C) 97.5 F (36.4 C)  TempSrc:   Oral Oral  Resp: 15 18 18 18   Height:      Weight:   90 kg (198 lb 6.6 oz)   SpO2:  95% 98% 96%   Blood pressure 147/83, pulse 94, temperature 97.5 F (36.4 C), temperature source Oral, resp. rate 18, height 5\' 10"  (1.778 m), weight 90 kg (198 lb 6.6 oz), last menstrual period 09/03/2008, SpO2 96.00%. PSYCH: SHe is alert and oriented x4; does not appear anxious does not appear depressed; affect is normal HEENT: Normocephalic and Atraumatic, Mucous membranes pink; PERRLA; EOM intact; Fundi:  Benign;  No scleral icterus, Nares: Patent, Oropharynx: Clear, Edentulous or Fair Dentition, Neck:  FROM, no cervical lymphadenopathy nor thyromegaly or carotid bruit; no JVD; Breasts:: Not examined CHEST WALL: No tenderness CHEST: Normal respiration, clear to auscultation bilaterally HEART: Regular rate and rhythm; no murmurs rubs or gallops BACK: No kyphosis or scoliosis; no CVA tenderness ABDOMEN: Positive Bowel Sounds, Scaphoid, Obese, soft non-tender; no masses, no organomegaly, no pannus; no intertriginous candida. Rectal Exam: Not done EXTREMITIES: No bone or joint deformity; age-appropriate arthropathy of the hands and knees; no cyanosis, clubbing or edema; no ulcerations. Genitalia: not examined PULSES: 2+ and symmetric SKIN: Normal hydration no rash or ulceration CNS: Cranial nerves 2-12 grossly intact no focal neurologic deficit   Labs & Imaging Results for orders placed during the hospital encounter of 03/07/12 (from the past 48 hour(s))  POCT I-STAT TROPONIN I     Status: Normal   Collection Time   03/07/12  2:18 PM      Component Value Range Comment    Troponin i, poc 0.03  0.00 - 0.08 ng/mL    Comment 3            CBC WITH DIFFERENTIAL  Status: Abnormal   Collection Time   03/07/12  2:19 PM      Component Value Range Comment   WBC 11.1 (*) 4.0 - 10.5 K/uL    RBC 2.89 (*) 3.87 - 5.11 MIL/uL    Hemoglobin 9.3 (*) 12.0 - 15.0 g/dL    HCT 16.1 (*) 09.6 - 46.0 %    MCV 100.3 (*) 78.0 - 100.0 fL    MCH 32.2  26.0 - 34.0 pg    MCHC 32.1  30.0 - 36.0 g/dL    RDW 04.5 (*) 40.9 - 15.5 %    Platelets 312  150 - 400 K/uL    Neutrophils Relative 82 (*) 43 - 77 %    Neutro Abs 9.1 (*) 1.7 - 7.7 K/uL    Lymphocytes Relative 9 (*) 12 - 46 %    Lymphs Abs 1.0  0.7 - 4.0 K/uL    Monocytes Relative 6  3 - 12 %    Monocytes Absolute 0.7  0.1 - 1.0 K/uL    Eosinophils Relative 2  0 - 5 %    Eosinophils Absolute 0.2  0.0 - 0.7 K/uL    Basophils Relative 1  0 - 1 %    Basophils Absolute 0.1  0.0 - 0.1 K/uL   COMPREHENSIVE METABOLIC PANEL     Status: Abnormal   Collection Time   03/07/12  2:19 PM      Component Value Range Comment   Sodium 130 (*) 135 - 145 mEq/L    Potassium 3.8  3.5 - 5.1 mEq/L    Chloride 91 (*) 96 - 112 mEq/L    CO2 20  19 - 32 mEq/L    Glucose, Bld 266 (*) 70 - 99 mg/dL    BUN 63 (*) 6 - 23 mg/dL    Creatinine, Ser 8.11 (*) 0.50 - 1.10 mg/dL    Calcium 91.4  8.4 - 10.5 mg/dL    Total Protein 6.9  6.0 - 8.3 g/dL    Albumin 2.4 (*) 3.5 - 5.2 g/dL    AST 11  0 - 37 U/L    ALT 9  0 - 35 U/L    Alkaline Phosphatase 191 (*) 39 - 117 U/L    Total Bilirubin 0.1 (*) 0.3 - 1.2 mg/dL    GFR calc non Af Amer 6 (*) >90 mL/min    GFR calc Af Amer 7 (*) >90 mL/min   PROTIME-INR     Status: Abnormal   Collection Time   03/07/12  2:19 PM      Component Value Range Comment   Prothrombin Time 16.0 (*) 11.6 - 15.2 seconds    INR 1.25  0.00 - 1.49   APTT     Status: Normal   Collection Time   03/07/12  3:35 PM      Component Value Range Comment   aPTT 33  24 - 37 seconds   CARDIAC PANEL(CRET KIN+CKTOT+MB+TROPI)     Status:  Abnormal   Collection Time   03/07/12 11:54 PM      Component Value Range Comment   Total CK 25  7 - 177 U/L    CK, MB 4.4 (*) 0.3 - 4.0 ng/mL    Troponin I <0.30  <0.30 ng/mL    Relative Index RELATIVE INDEX IS INVALID  0.0 - 2.5   D-DIMER, QUANTITATIVE     Status: Abnormal   Collection Time   03/07/12 11:54 PM      Component Value  Range Comment   D-Dimer, Quant 3.54 (*) 0.00 - 0.48 ug/mL-FEU   CARDIAC PANEL(CRET KIN+CKTOT+MB+TROPI)     Status: Abnormal   Collection Time   03/08/12  1:13 AM      Component Value Range Comment   Total CK 20  7 - 177 U/L    CK, MB 4.3 (*) 0.3 - 4.0 ng/mL    Troponin I <0.30  <0.30 ng/mL    Relative Index RELATIVE INDEX IS INVALID  0.0 - 2.5    US Venous Img Lower Bilateral  03/07/2012  *RADIOLOGY REPORT*  Clinical Data: Bilateral lower extremity edema; post aortobifemoral bypass grafting 3 weeks ago  VENOUS DUPLEX ULTRASOUND OF BILATERAL LOWER EXTREMITIES  Technique:  Gray-scale sonography with graded compression, as well as color Doppler and duplex ultrasound, were performed to evaluate the deep venous system of both lower extremities from the level of the common femoral vein through the popliteal and proximal calf veins.  Spectral Doppler was utilized to evaluate flow at rest and with distal augmentation maneuvers. The common femoral veins are inadequately visualized due to the surgical dressings at these regions.  Comparison:  02/24/2010  Findings:  The deep venous systems appear patent and compressible at the profunda femoral, femoral, popliteal veins.  Common femoral veins are not adequately visualized due to dressings at inguinal regions bilaterally from prior surgery.  The saphenofemoral junctions are not adequately visualized.  No evidence of deep venous thrombus identified within the visualized segments. Spontaneous venous flow is present with intact augmentation. Large hypoechoic collection identified at the proximal right thigh, 6.5 x 3.0 x 5.0 cm in  size, question postoperative lymphocele, seroma or resolving hematoma.  IMPRESSION: Unable to adequately assess the common femoral veins and saphenofemoral junctions due to dressings. Postoperative fluid collection at the proximal left thigh 6.5 x 3.0 x 5.0 cm in size question lymphocele, seroma or resolving hematoma. No evidence of deep venous thrombosis is within the visualized portions of the deep venous systems of the lower extremities as above.  Original Report Authenticated By: Lollie Marrow, M.D.   Dg Chest Port 1 View  03/07/2012  *RADIOLOGY REPORT*  Clinical Data: Chest pain  PORTABLE CHEST - 1 VIEW  Comparison: 03/06/2012; 02/21/2012; 02/15/2012  Findings:  Grossly unchanged enlarged cardiac silhouette and mediastinal contours.  Stable positioning of support apparatus with dialysis catheter tips overlying the central location of the right atrium. Grossly unchanged bibasilar heterogeneous opacities, right greater than left.  There is chronic blunting of the right costophrenic angle.  No definite pneumothorax.  Unchanged bones. Left axillary vascular stent.  IMPRESSION:  1.  No acute cardiopulmonary disease. 2.  Chronic blunting of the right costophrenic angle with bibasilar heterogeneous opacities, right greater than left, favored to represent atelectasis.  Original Report Authenticated By: Waynard Reeds, M.D.   Dg Chest Port 1 View  03/06/2012  *RADIOLOGY REPORT*  Clinical Data: Chest pain, cough, history diabetes, coronary disease, prior pulmonary embolism and MI, hypertension  PORTABLE CHEST - 1 VIEW  Comparison: Portable exam 1027 hours compared to 02/21/2012  Findings: Large-bore double-lumen right jugular central venous catheter, tip projecting over right atrium. Enlargement of cardiac silhouette with pulmonary vascular congestion. Minimal atelectasis right base. Lungs otherwise clear. No pleural effusion or pneumothorax. Stents identified at the left axillary vessels.  IMPRESSION: Enlargement  of cardiac silhouette with pulmonary vascular congestion. Minimal right basilar atelectasis.  Original Report Authenticated By: Lollie Marrow, M.D.      Assessment: Present on Admission:  .  Chest pain .CAD, NATIVE VESSEL .DIABETES .HYPERTENSION, UNSPECIFIED .ESRD on dialysis .Diabetic leg ulcer .PVD (peripheral vascular disease) .Tobacco abuse   Plan:    Admit for 23 hr Observation to Telemetry Bed Cardiac Enzymes, Nitropatch, O2, (?Allergy to ASA) Notify Dialysis Team to continue Dialysis Continue Regular meds SSI coverage PRN Nicotine Patch and Smoking Cessation counseling Other plans as per orders.    CODE STATUS:      FULL CODE         Yanixan Mellinger C 03/08/2012, 6:38 AM

## 2012-03-08 NOTE — Progress Notes (Deleted)
[facility] PROGRESS NOTE  [name] ZOX:[id number] DOB: 20-May-1971 DOA: 03/07/2012 PCP: Ernestine Conrad, MD Cardiologist: Dietrich Pates, MD  Assessment/Plan: 1. Chest pain, history coronary artery disease: Clinically resolved. Troponins negative. Cardiology has signed off and recommends no further evaluation. Continue Plavix. 2. Acute encephalopathy/somulence: Resolved. Review of records demonstrates frequent oversedation secondary to narcotics and benzodiazepines. Patient alert and oriented. See no reason to pursue head CT ordered by cardiology. 3. Elevated d-dimer: Ordered by cardiology. Clinical suspicion for PE is low at this point. Given the patient's chronic comorbidities and history of surgery in the past the clinical utility of this test is questionable. Patient has had no tachycardia (not on any rate control agents), no hemodynamic instability and no hypoxia. At this point I think there is little reason to suspect pulmonary embolism but given her history and positive testing we will pursue V/Q scan. No therapeutic anticoagulants unless positive. 4. End-stage renal disease: T, T, S. Nephrology consulted. 5. Wound right ankle: Present on admission. Followup as an outpatient. 6. Diabetes mellitus: Stable. Resume Lantus and continue sliding scale insulin. 7. History of DVT, PE: Not on anticoagulation as an outpatient. No reason to suspect recurrence. 8. 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 Ultrasound noted--suspect seroma. Follow-up with vascular surgery as an outpatient. 9. Cigarette smoker: nicotine patch  Patient was transferred to [facility] under IVC (basis for order unclear but was obtained by petition of family)--patient alert and oriented. I have requested psychiatry consultation for capacity assessment and patient has refused dialysis and hospitalization in the recent  past.  Code Status: Full code Family Communication: Disposition Plan: Home when improved  Brendia Sacks, MD  [facility] Pager [phone]. If 8PM-8AM, please contact night-coverage at [email or web address], password [facility] 03/08/2012, 10:21 AM  LOS: 1 day   Brief narrative: 41 year old woman-- -Seen Emergency Department 7/12 at [facility] for chest pain. Accepted by Dr. Tenny Craw at [facility]. Patient refused transfer. [place] [employer] served [name]. Subsequently it was requested that the hospitalist service admit her. -24 pounds above weight secondary to history of intermittent dialysis refusal and dialysis center refusal to dialyze because of chest pain. -Seen in the emergency department 7/11 her chest pain. Patient left AGAINST MEDICAL ADVICE.  Consultants:  Nephrology  Cardiology  Psychiatry  Procedures:  Routine hemodialysis  HPI/Subjective: No chest pain, no shortness of breath. No complaints.  Objective: Filed Vitals:   03/07/12 1942 03/07/12 2120 03/08/12 0341 03/08/12 0646  BP: 160/82 167/85 147/83 139/77  Pulse: 87 89 94 88  Temp:  97.6 F (36.4 C) 97.5 F (36.4 C)   TempSrc:  Oral Oral   Resp: 18 18 18    Height:      Weight:  90 kg (198 lb 6.6 oz)    SpO2: 95% 98% 96%    No intake or output data in the 24 hours ending 03/08/12 1021  Exam:   General:  Appears calm and comfortable. Speech fluent and clear. Alert. Oriented to self, [facility], month, year.  Cardiovascular: RRR, no m/r/g. Minimal lower extremity edema.  Respiratory: CTA bilaterally, no w/r/r. Normal respiratory effort.  Abdomen: Soft, nt, nd.  Data Reviewed: Basic Metabolic Panel:  Lab 03/08/12 8295 03/07/12 1419 03/06/12 0942  NA 130* 130* 130*  K 3.9 3.8 3.7  CL 91* 91* 91*  CO2 16* 20 18*  GLUCOSE 210* 266* 219*  BUN 69* 63* 79*  CREATININE 7.17*  7.15* 8.57*  CALCIUM 9.6 10.2 10.1  MG -- -- --  PHOS -- -- --   Liver Function Tests:  Lab 03/07/12 1419   AST 11  ALT 9  ALKPHOS 191*  BILITOT 0.1*  PROT 6.9  ALBUMIN 2.4*   CBC:  Lab 03/08/12 0630 03/07/12 1419 03/06/12 0942  WBC 10.6* 11.1* 12.4*  NEUTROABS -- 9.1* 10.7*  HGB 9.1* 9.3* 9.1*  HCT 28.7* 29.0* 28.4*  MCV 100.3* 100.3* 101.1*  PLT 305 312 311   Cardiac Enzymes:  Lab 03/08/12 0630 03/08/12 0113 03/07/12 2354 03/06/12 0942  CKTOTAL 19 20 25  --  CKMB 4.1* 4.3* 4.4* --  CKMBINDEX -- -- -- --  TROPONINI <0.30 <0.30 <0.30 <0.30    Basename 02/14/12 1447 08/20/11 1010  PROBNP 13271.0* 4201.0*   CBG:  Lab 03/08/12 0644  GLUCAP 204*   Recent Results (from the past 240 hour(s))  WOUND CULTURE     Status: Normal (Preliminary result)   Collection Time   03/07/12 11:12 PM      Component Value Range Status Comment   Specimen Description WOUND RIGHT ANKLE   Final    Special Requests RULE OUT MRSA   Final    Gram Stain     Final    Value: NO WBC SEEN     RARE SQUAMOUS EPITHELIAL CELLS PRESENT     NO ORGANISMS SEEN   Culture PENDING   Incomplete    Report Status PENDING   Incomplete     Studies: US Venous Img Lower Bilateral  03/07/2012  *RADIOLOGY REPORT* IMPRESSION: Unable to adequately assess the common femoral veins and saphenofemoral junctions due to dressings. Postoperative fluid collection at the proximal left thigh 6.5 x 3.0 x 5.0 cm in size question lymphocele, seroma or resolving hematoma. No evidence of deep venous thrombosis is within the visualized portions of the deep venous systems of the lower extremities as above.  Original Report Authenticated By: Lollie Marrow, M.D.   Dg Chest Port 1 View  03/07/2012  *RADIOLOGY REPORT*   IMPRESSION:  1.  No acute cardiopulmonary disease. 2.  Chronic blunting of the right costophrenic angle with bibasilar heterogeneous opacities, right greater than left, favored to represent atelectasis.  Original Report Authenticated By: Waynard Reeds, M.D.   Scheduled Meds:   . sodium chloride   Intravenous STAT  .  clopidogrel  75 mg Oral Q breakfast  . enoxaparin (LOVENOX) injection  40 mg Subcutaneous Q24H  . insulin aspart  0-5 Units Subcutaneous QHS  . insulin aspart  0-9 Units Subcutaneous TID WC  . metoprolol      . nicotine  21 mg Transdermal Daily  . nitroGLYCERIN  0.2 mg Transdermal Daily  . sodium chloride  3 mL Intravenous Q12H  . sodium chloride  3 mL Intravenous Q12H   Continuous Infusions:   . sodium chloride 40 mL/hr (03/07/12 1540)    Principal Problem:  *Chest pain Active Problems:  DIABETES  HYPERTENSION, UNSPECIFIED  CAD, NATIVE VESSEL  ESRD on dialysis  PVD (peripheral vascular disease)  Tobacco abuse  Diabetic leg ulcer     Brendia Sacks, MD  [facility] Pager [phone]. If 8PM-8AM, please contact night-coverage at [email or web address], password [facility] 03/08/2012, 10:21 AM  LOS: 1 day

## 2012-03-08 NOTE — Progress Notes (Signed)
Pt refused to have CT scan done, MD on call made aware. Will continue to monitor.

## 2012-03-08 NOTE — Progress Notes (Signed)
Family here and very concerned about patient. Patient does not want family to be informed of any of her medical conditions. Sister wants psych to be aware that patient eats soap and that father of patient committed suicide years ago. Julie Paolini, Chrystine Oiler

## 2012-03-08 NOTE — Progress Notes (Signed)
Patient ID: [name], female   DOB: 1970/09/26, 41 y.o.   MRN: [id number]   SUBJECTIVE: No further chest pain. Troponins are normal.   Filed Vitals:   03/07/12 1942 03/07/12 2120 03/08/12 0341 03/08/12 0646  BP: 160/82 167/85 147/83 139/77  Pulse: 87 89 94 88  Temp:  97.6 F (36.4 C) 97.5 F (36.4 C)   TempSrc:  Oral Oral   Resp: 18 18 18    Height:      Weight:  198 lb 6.6 oz (90 kg)    SpO2: 95% 98% 96%    No intake or output data in the 24 hours ending 03/08/12 1017  LABS: Basic Metabolic Panel:  Basename 03/08/12 0630 03/07/12 1419  NA 130* 130*  K 3.9 3.8  CL 91* 91*  CO2 16* 20  GLUCOSE 210* 266*  BUN 69* 63*  CREATININE 7.17* 7.15*  CALCIUM 9.6 10.2  MG -- --  PHOS -- --   Liver Function Tests:  Basename 03/07/12 1419  AST 11  ALT 9  ALKPHOS 191*  BILITOT 0.1*  PROT 6.9  ALBUMIN 2.4*   No results found for this basename: LIPASE:2,AMYLASE:2 in the last 72 hours CBC:  Basename 03/08/12 0630 03/07/12 1419 03/06/12 0942  WBC 10.6* 11.1* --  NEUTROABS -- 9.1* 10.7*  HGB 9.1* 9.3* --  HCT 28.7* 29.0* --  MCV 100.3* 100.3* --  PLT 305 312 --   Cardiac Enzymes:  Basename 03/08/12 0630 03/08/12 0113 03/07/12 2354  CKTOTAL 19 20 25   CKMB 4.1* 4.3* 4.4*  CKMBINDEX -- -- --  TROPONINI <0.30 <0.30 <0.30   BNP: No components found with this basename: POCBNP:3 D-Dimer:  [facility] 03/07/12 2354  DDIMER 3.54*   Hemoglobin A1C: No results found for this basename: HGBA1C in the last 72 hours Fasting Lipid Panel: No results found for this basename: CHOL,HDL,LDLCALC,TRIG,CHOLHDL,LDLDIRECT in the last 72 hours Thyroid Function Tests: No results found for this basename: TSH,T4TOTAL,FREET3,T3FREE,THYROIDAB in the last 72 hours  RADIOLOGY: US Venous Img Lower Bilateral  03/07/2012  *RADIOLOGY REPORT*  Clinical Data: Bilateral lower extremity edema; post aortobifemoral bypass grafting 3 weeks ago  VENOUS DUPLEX ULTRASOUND OF BILATERAL LOWER EXTREMITIES   Technique:  Gray-scale sonography with graded compression, as well as color Doppler and duplex ultrasound, were performed to evaluate the deep venous system of both lower extremities from the level of the common femoral vein through the popliteal and proximal calf veins.  Spectral Doppler was utilized to evaluate flow at rest and with distal augmentation maneuvers. The common femoral veins are inadequately visualized due to the surgical dressings at these regions.  Comparison:  02/24/2010  Findings:  The deep venous systems appear patent and compressible at the profunda femoral, femoral, popliteal veins.  Common femoral veins are not adequately visualized due to dressings at inguinal regions bilaterally from prior surgery.  The saphenofemoral junctions are not adequately visualized.  No evidence of deep venous thrombus identified within the visualized segments. Spontaneous venous flow is present with intact augmentation. Large hypoechoic collection identified at the proximal right thigh, 6.5 x 3.0 x 5.0 cm in size, question postoperative lymphocele, seroma or resolving hematoma.  IMPRESSION: Unable to adequately assess the common femoral veins and saphenofemoral junctions due to dressings. Postoperative fluid collection at the proximal left thigh 6.5 x 3.0 x 5.0 cm in size question lymphocele, seroma or resolving hematoma. No evidence of deep venous thrombosis is within the visualized portions of the deep venous systems of the lower extremities as above.  Original Report Authenticated By: Lollie Marrow, M.D.   Dg Chest Port 1 View  03/07/2012  *RADIOLOGY REPORT*  Clinical Data: Chest pain  PORTABLE CHEST - 1 VIEW  Comparison: 03/06/2012; 02/21/2012; 02/15/2012  Findings:  Grossly unchanged enlarged cardiac silhouette and mediastinal contours.  Stable positioning of support apparatus with dialysis catheter tips overlying the central location of the right atrium. Grossly unchanged bibasilar heterogeneous  opacities, right greater than left.  There is chronic blunting of the right costophrenic angle.  No definite pneumothorax.  Unchanged bones. Left axillary vascular stent.  IMPRESSION:  1.  No acute cardiopulmonary disease. 2.  Chronic blunting of the right costophrenic angle with bibasilar heterogeneous opacities, right greater than left, favored to represent atelectasis.  Original Report Authenticated By: Waynard Reeds, M.D.   Dg Chest Port 1 View  03/06/2012  *RADIOLOGY REPORT*  Clinical Data: Chest pain, cough, history diabetes, coronary disease, prior pulmonary embolism and MI, hypertension  PORTABLE CHEST - 1 VIEW  Comparison: Portable exam 1027 hours compared to 02/21/2012  Findings: Large-bore double-lumen right jugular central venous catheter, tip projecting over right atrium. Enlargement of cardiac silhouette with pulmonary vascular congestion. Minimal atelectasis right base. Lungs otherwise clear. No pleural effusion or pneumothorax. Stents identified at the left axillary vessels.  IMPRESSION: Enlargement of cardiac silhouette with pulmonary vascular congestion. Minimal right basilar atelectasis.  Original Report Authenticated By: Lollie Marrow, M.D.   Dg Chest Port 1 View  02/21/2012  *RADIOLOGY REPORT*  Clinical Data: Status post dialysis catheter placement  PORTABLE CHEST - 1 VIEW  Comparison: 02/15/2012  Findings: There is a right chest wall dialysis catheter with tips in the right atrium.  Heart size appears normal.  Diminished aeration to both lung bases may be due to dependent edema or atelectasis.  Heart size is mildly enlarged as before.  IMPRESSION:  1.  Tips of the right-sided dialysis catheter are in the right atrium. 2.  Diminished aeration to the lung bases which may represent atelectasis or areas of dependent edema.  Original Report Authenticated By: Rosealee Albee, M.D.   Dg Chest Port 1 View  02/15/2012  *RADIOLOGY REPORT*  Clinical Data: Status post diatek catheter exchange.   PORTABLE CHEST - 1 VIEW  Comparison: Portable chest 02/15/2012.  Findings: The heart size is exaggerated by low lung volumes.  The previous left IJ dialysis catheter has been removed.  A new right IJ dialysis catheter is in place.  The tip the distal limb is at the cavoatrial junction.  There is some bibasilar atelectasis.  IMPRESSION:  1.  New right IJ dialysis catheter.  The tip of the distal lumen is at the cavoatrial junction. 2.  Interval removal of left IJ dialysis catheter. 3.  Mild bibasilar atelectasis.  Original Report Authenticated By: Jamesetta Orleans. MATTERN, M.D.   Dg Chest Port 1 View  02/15/2012  *RADIOLOGY REPORT*  Clinical Data: Shortness of breath.  PORTABLE CHEST - 1 VIEW  Comparison: Portable chest is 02/14/2012.  Findings: A left IJ dialysis catheter is in place.  The heart size is exaggerated by low lung volumes.  There is some residual blunting of the right costophrenic angle, improved from the prior exam.  IMPRESSION:  1.  Slight improved aeration at the lung bases bilaterally. 2.  Persistent low lung volumes. 3.  Persistent blunting of the right costophrenic angle.  Original Report Authenticated By: Jamesetta Orleans. MATTERN, M.D.   Dg Chest Port 1 View  02/14/2012  *RADIOLOGY REPORT*  Clinical  Data: Cough.  Sepsis.  PORTABLE CHEST - 1 VIEW  Comparison: 02/14/2012.  Findings: Low volume chest.  Basilar atelectasis is present.  Left IJ dialysis catheters present with the distal tip in the right atrium.  Right pleural effusion.  Cardiopericardial silhouette is enlarged. Patchy density is present at the left lung base, likely representing airspace disease.  Differential considerations include pneumonia or asymmetric/atypical pulmonary edema.  IMPRESSION: 1.  Low volume chest. 2. Small right pleural effusion and right greater than left basilar atelectasis. 3.  Mild cardiomegaly. 4.  Patchy airspace disease at the left base along the left heart border suspicious for lingular pneumonia or edema.   Original Report Authenticated By: Andreas Newport, M.D.   Dg Fluoro Guide Cv Line-no Report  02/15/2012  CLINICAL DATA: insertion of dialysis catheter   FLOURO GUIDE CV LINE  Fluoroscopy was utilized by the requesting physician.  No radiographic  interpretation.      PHYSICAL EXAM  Lungs reveal scattered rhonchi. Cardiac exam reveals S1 and S2 .   TELEMETRY: I have reviewed telemetry today March 08, 2012. There is normal sinus rhythm   ASSESSMENT AND PLAN:   *Chest pain    Troponins are normal. Cardiac status is stable. No further work up.  Hypoechoic area In the right thigh. Question lymphocele or other abnormality post surgery   Suggest followup assessment by vascular surgery.  Overall cardiac status is stable. No further cardiac workup. We will sign off.  Willa Rough 03/08/2012 10:17 AM

## 2012-03-08 NOTE — Progress Notes (Signed)
Patient refusing to have CT and lab draws.

## 2012-03-09 DIAGNOSIS — F319 Bipolar disorder, unspecified: Secondary | ICD-10-CM

## 2012-03-09 DIAGNOSIS — R7989 Other specified abnormal findings of blood chemistry: Secondary | ICD-10-CM | POA: Diagnosis present

## 2012-03-09 DIAGNOSIS — L97909 Non-pressure chronic ulcer of unspecified part of unspecified lower leg with unspecified severity: Secondary | ICD-10-CM

## 2012-03-09 DIAGNOSIS — E1169 Type 2 diabetes mellitus with other specified complication: Secondary | ICD-10-CM

## 2012-03-09 DIAGNOSIS — R791 Abnormal coagulation profile: Secondary | ICD-10-CM

## 2012-03-09 DIAGNOSIS — E119 Type 2 diabetes mellitus without complications: Secondary | ICD-10-CM

## 2012-03-09 LAB — GLUCOSE, CAPILLARY

## 2012-03-09 NOTE — Progress Notes (Signed)
Bedside RN notified Thereasa Parkin that patient wanted to sign out AMA. Explained that patient had been involuntarily committed and could not be release until a psychiatrist had seen the patient and reversed the commitment. Called [facility] to confirm that patient would be seen by psychiatrist today. Was informed that consulting psychiatrist was aware and would be here shortly. Informed Attending. Will look for paper form that reverses involuntary commitment if that is what psychiatrist decides is appropriate and notify attending if he can discharge patient. Bedside RN aware. Harlow Asa

## 2012-03-09 NOTE — Discharge Summary (Signed)
Physician Discharge Summary  [name] NWG:[id number] DOB: Dec 30, 1970 DOA: 03/07/2012  PCP: Ernestine Conrad, MD Cardiologist: Dietrich Pates, MD  Admit date: 03/07/2012 Discharge date: 03/09/2012  Recommendations for Outpatient Follow-up:  1. Continue outpatient psychotherapy--note that patient has been deemed to have capacity to make medical decisions. 2. Continue outpatient wound care--note that culture was obtained from chronic right ankle wound. However, wound has no evidence of infection and culture is felt to be spurious. Follow ankle wound clinically.  3. Follow-up probable seroma from previous surgery.  Follow-up Information    Follow up with Ernestine Conrad, MD in 1 week.   Contact information:   [facility] [address] [place] [address] [phone]         Discharge Diagnoses:  1. Chest pain, non-cardiac 2. Acute encephalopathy 3. ESRD 4. Chronic wounds bilateral groin, right ankle 5. Non-compliance  Discharge Condition: Improved Disposition: Home  Diet recommendation: Heart healthy, diabetic  History of present illness:  41 year old woman-- -Seen Emergency Department 7/12 at [facility] for chest pain. Accepted by Dr. Tenny Craw at [facility]. Patient refused transfer. [employer] served [name]. Subsequently it was requested that the hospitalist service admit her for chest pain. -24 pounds above weight secondary to history of intermittent dialysis refusal and dialysis center refusal to dialyze because of chest pain.  Hospital Course:  [name] was admitted to the medical floor and seen in consultation with cardiology. She had no further chest pain and cardiac enzymes were negative. Cardiology recommended no further testing. The patient, though recently non-compliant, agreed to hemodialysis as an inpatient. Because of hsitory of non-compliance and IVC on this hospitalization, patient was seen by psychiatry--case was discussed with  psychiatrist in person. The patient is deemed to have capacity, and IVC was canceled by psychiatrist. She has outpatient follow-up already with her psychiatrist. Chronic wounds appeared stable and patient has outpatient follow-up this month with her vascular surgeon already scheduled. 1. Chest pain, history coronary artery disease: Resolved--no further episodes. Troponins negative. Cardiology has signed off and recommends no further evaluation. Continue Plavix.  2. Acute encephalopathy/somulence: Resolved. Review of records demonstrates frequent oversedation secondary to narcotics and benzodiazepines. Patient remains alert and oriented.    3. Elevated d-dimer: Clinical suspicion for PE is low. Given the patient's chronic comorbidities and history of surgery in the past the clinical utility of this test is questionable. Patient has had no tachycardia (not on any rate control agents), no hemodynamic instability and no hypoxia. Patient refused V/Q scan. Lower extremity Dopplers were limited but negative. No further evaluation recommended.  4. End-stage renal disease: T, T, S. Status post hemodialysis 7/13. Nephrology assistance appreciated.  5. Wound right ankle: Present on admission. No evidence of infection at this point. Culture most likely reflects colonization/contamination. Followup as an outpatient.  6. Diabetes mellitus: Stable. Continue Lantus and sliding scale insulin.  7. History of DVT, PE: Not on anticoagulation as an outpatient. No reason to suspect recurrence.  8. 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 No evidence of infection. Has completed in the past antibiotics. Ultrasound noted--suspect seroma. Follow-up with vascular surgery as an outpatient.    9. Cigarette smoker: nicotine patch  Consultants:  Nephrology   Cardiology   Psychiatry  Procedures:  Routine  hemodialysis  Discharge Instructions  Discharge Orders    Future Appointments: Provider: Department: Dept Phone: Center:   03/12/2012 1:40 PM Elon Jester  Leda Gauze, [place] [facility] [phone] [facility]   03/17/2012 11:30 AM Nada Libman, MD [facility] [phone] [facility]     Future Orders Please Complete By Expires   Diet - low sodium heart healthy      Diet Carb Modified      Discharge instructions      Comments:   1. Be sure to keep your follow-up appointments with your psychiatrist and your vascular surgeon. 2. Continue outpatient hemodialysis. 3. Notify physician for recurrent chest pain. 4. Continue wound care for chronic wounds.   Activity as tolerated - No restrictions        Medication List  As of 03/09/2012  3:10 PM   TAKE these medications         albuterol 108 (90 BASE) MCG/ACT inhaler   Commonly known as: PROVENTIL HFA;VENTOLIN HFA   Inhale 2 puffs into the lungs every 6 (six) hours as needed. Shortness of breath      ALPRAZolam 1 MG tablet   Commonly known as: XANAX   Take 1 mg by mouth 3 (three) times daily as needed. For anxiety      ARIPiprazole 2 MG tablet   Commonly known as: ABILIFY   Take 10 mg by mouth daily.      b complex-vitamin c-folic acid 0.8 MG Tabs   Take 0.8 mg by mouth at bedtime.      cinacalcet 30 MG tablet   Commonly known as: SENSIPAR   Take 30 mg by mouth daily with supper.      clopidogrel 75 MG tablet   Commonly known as: PLAVIX   Take 75 mg by mouth daily. Last dose 12/17/2011      darbepoetin 60 MCG/0.3ML Soln   Commonly known as: ARANESP   Inject 0.3 mLs (60 mcg total) into the vein every Saturday with hemodialysis.      hydroxypropyl methylcellulose 2.5 % ophthalmic solution   Commonly known as: ISOPTO TEARS   Place 1 drop into both eyes 3 (three) times daily as needed. Dry eyes      insulin glargine 100 UNIT/ML injection   Commonly known as: LANTUS   Inject 33 Units into the skin at bedtime.      insulin lispro  100 UNIT/ML injection   Commonly known as: HUMALOG   Inject 7-15 Units into the skin 4 (four) times daily -  before meals and at bedtime. TAKING ON SLIDING SCALE  200-249=7 units  250-299=10 units  300-349=12 units  >350=15 units      ofloxacin 0.3 % ophthalmic solution   Commonly known as: OCUFLOX   Place 1 drop into the right eye 3 (three) times daily.      omeprazole 40 MG capsule   Commonly known as: PRILOSEC   Take 40 mg by mouth daily.      PARoxetine 40 MG tablet   Commonly known as: PAXIL   Take 80 mg by mouth every morning.      pyridoxine 100 MG tablet   Commonly known as: B-6   Take 100 mg by mouth daily.      SANTYL ointment   Generic drug: collagenase   APPLY DAILY      sevelamer 800 MG tablet   Commonly known as: RENVELA   Take 1,600-4,000 mg by mouth 5 (five) times daily. *Take 5 tablets daily with each meal and take 2 tablets with snacks**      SYSTANE LID WIPES Pads   Place 1 application into the right eye daily.  The results of significant diagnostics from this hospitalization (including imaging, microbiology, ancillary and laboratory) are listed below for reference.    Significant Diagnostic Studies: US Venous Img Lower Bilateral  03/07/2012  *RADIOLOGY REPORT*  Clinical Data: Bilateral lower extremity edema; post aortobifemoral bypass grafting 3 weeks ago  VENOUS DUPLEX ULTRASOUND OF BILATERAL LOWER EXTREMITIES  Technique:  Gray-scale sonography with graded compression, as well as color Doppler and duplex ultrasound, were performed to evaluate the deep venous system of both lower extremities from the level of the common femoral vein through the popliteal and proximal calf veins.  Spectral Doppler was utilized to evaluate flow at rest and with distal augmentation maneuvers. The common femoral veins are inadequately visualized due to the surgical dressings at these regions.  Comparison:  02/24/2010  Findings:  The deep venous systems appear  patent and compressible at the profunda femoral, femoral, popliteal veins.  Common femoral veins are not adequately visualized due to dressings at inguinal regions bilaterally from prior surgery.  The saphenofemoral junctions are not adequately visualized.  No evidence of deep venous thrombus identified within the visualized segments. Spontaneous venous flow is present with intact augmentation. Large hypoechoic collection identified at the proximal right thigh, 6.5 x 3.0 x 5.0 cm in size, question postoperative lymphocele, seroma or resolving hematoma.  IMPRESSION: Unable to adequately assess the common femoral veins and saphenofemoral junctions due to dressings. Postoperative fluid collection at the proximal left thigh 6.5 x 3.0 x 5.0 cm in size question lymphocele, seroma or resolving hematoma. No evidence of deep venous thrombosis is within the visualized portions of the deep venous systems of the lower extremities as above.  Original Report Authenticated By: Lollie Marrow, M.D.   Dg Chest Port 1 View  03/07/2012  *RADIOLOGY REPORT*  Clinical Data: Chest pain  PORTABLE CHEST - 1 VIEW  Comparison: 03/06/2012; 02/21/2012; 02/15/2012  Findings:  Grossly unchanged enlarged cardiac silhouette and mediastinal contours.  Stable positioning of support apparatus with dialysis catheter tips overlying the central location of the right atrium. Grossly unchanged bibasilar heterogeneous opacities, right greater than left.  There is chronic blunting of the right costophrenic angle.  No definite pneumothorax.  Unchanged bones. Left axillary vascular stent.  IMPRESSION:  1.  No acute cardiopulmonary disease. 2.  Chronic blunting of the right costophrenic angle with bibasilar heterogeneous opacities, right greater than left, favored to represent atelectasis.  Original Report Authenticated By: Waynard Reeds, M.D.   Microbiology: Recent Results (from the past 240 hour(s))  WOUND CULTURE     Status: Normal (Preliminary  result)   Collection Time   03/07/12 11:12 PM      Component Value Range Status Comment   Specimen Description WOUND RIGHT ANKLE   Final    Special Requests RULE OUT MRSA   Final    Gram Stain     Final    Value: NO WBC SEEN     RARE SQUAMOUS EPITHELIAL CELLS PRESENT     NO ORGANISMS SEEN   Culture     Final    Value: MODERATE STAPHYLOCOCCUS AUREUS     Note: RIFAMPIN AND GENTAMICIN SHOULD NOT BE USED AS SINGLE DRUGS FOR TREATMENT OF STAPH INFECTIONS.     FEW GRAM NEGATIVE RODS   Report Status PENDING   Incomplete   MRSA PCR SCREENING     Status: Normal   Collection Time   03/08/12 12:03 PM      Component Value Range Status Comment   MRSA by PCR  NEGATIVE  NEGATIVE Final     Labs: Basic Metabolic Panel:  Lab 03/08/12 1610 03/07/12 1419 03/06/12 0942  NA 130* 130* 130*  K 3.9 3.8 3.7  CL 91* 91* 91*  CO2 16* 20 18*  GLUCOSE 210* 266* 219*  BUN 69* 63* 79*  CREATININE 7.17* 7.15* 8.57*  CALCIUM 9.6 10.2 10.1  MG -- -- --  PHOS -- -- --   Liver Function Tests:  Lab 03/07/12 1419  AST 11  ALT 9  ALKPHOS 191*  BILITOT 0.1*  PROT 6.9  ALBUMIN 2.4*   CBC:  Lab 03/08/12 0630 03/07/12 1419 03/06/12 0942  WBC 10.6* 11.1* 12.4*  NEUTROABS -- 9.1* 10.7*  HGB 9.1* 9.3* 9.1*  HCT 28.7* 29.0* 28.4*  MCV 100.3* 100.3* 101.1*  PLT 305 312 311   Cardiac Enzymes:  Lab 03/08/12 1700 03/08/12 0630 03/08/12 0113 03/07/12 2354 03/06/12 0942  CKTOTAL 15 19 20 25  --  CKMB 3.6 4.1* 4.3* 4.4* --  CKMBINDEX -- -- -- -- --  TROPONINI <0.30 <0.30 <0.30 <0.30 <0.30    Basename 02/14/12 1447 08/20/11 1010  PROBNP 13271.0* 4201.0*   CBG:  Lab 03/09/12 1136 03/09/12 0606 03/08/12 2104 03/08/12 1106 03/08/12 0644  GLUCAP 145* 115* 186* 135* 204*    Principal Problem:  *Chest pain Active Problems:  DIABETES  CAD, NATIVE VESSEL  ESRD on dialysis  PVD (peripheral vascular disease)  Tobacco abuse  Diabetic leg ulcer  D-dimer, elevated   Time coordinating discharge: 25  minutes  Signed:  Brendia Sacks, MD [facility] 03/09/2012, 3:10 PM

## 2012-03-09 NOTE — Progress Notes (Signed)
Pt. Discharged 03/09/2012  3:34 PM Discharge instructions reviewed with patient/family. Patient/family verbalized understanding. Questions answered as needed. Pt. Discharged to home with family. Mikaella Escalona, Chrystine Oiler

## 2012-03-09 NOTE — Progress Notes (Signed)
[facility] PROGRESS NOTE  KEMESHA MOSEY ZOX:[id number] DOB: [birth year] DOA: 03/07/2012 PCP: Ernestine Conrad, MD Cardiologist: Dietrich Pates, MD  Assessment/Plan: 1. Chest pain, history coronary artery disease: Resolved--no further episodes. Troponins negative. Cardiology has signed off and recommends no further evaluation. Continue Plavix. 2. Acute encephalopathy/somulence: Resolved. Review of records demonstrates frequent oversedation secondary to narcotics and benzodiazepines. Patient remains alert and oriented.  3. Elevated d-dimer: Clinical suspicion for PE is low. Given the patient's chronic comorbidities and history of surgery in the past the clinical utility of this test is questionable. Patient has had no tachycardia (not on any rate control agents), no hemodynamic instability and no hypoxia. Patient refused V/Q scan. Lower extremity Dopplers were limited but negative. No further evaluation recommended. 4. End-stage renal disease: T, T, S. Status post hemodialysis 7/13. Nephrology assistance appreciated. 5. Wound right ankle: Present on admission. No evidence of infection at this point. Culture most likely reflects colonization. Followup as an outpatient. 6. Diabetes mellitus: Stable. Continue Lantus and sliding scale insulin. 7. History of DVT, PE: Not on anticoagulation as an outpatient. No reason to suspect recurrence. 8. 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 No evidence of infection. Has completed in the past antibiotics. Ultrasound noted--suspect seroma. Follow-up with vascular surgery as an outpatient.  9. Cigarette smoker: nicotine patch  Patient was transferred to [facility] under IVC (basis for order unclear but was obtained by petition of family)--patient alert and oriented. I have again requested psychiatry consultation for capacity assessment as the patient patient has  refused dialysis and hospitalization in the recent past. Discussed recommendations with patient and she was agreeable to consultation.  She remains alert and oriented and clearly states why she was admitted to the hospital. She denies any plans to harm herself and knows that she needs to be compliant with dialysis to live.  Code Status: Full code Family Communication: Disposition Plan: Home later today after psychiatry evaluation.  Brendia Sacks, MD  [facility] Pager [phone]. If 8PM-8AM, please contact night-coverage at [email or web address], password [facility] 03/09/2012, 10:18 AM  LOS: 2 days   Brief narrative: 41 year old woman-- -Seen Emergency Department 7/12 at [facility] for chest pain. Accepted by Dr. Tenny Craw at [facility]. Patient refused transfer. [employer] served [name]. Subsequently it was requested that the hospitalist service admit her. -24 pounds above weight secondary to history of intermittent dialysis refusal and dialysis center refusal to dialyze because of chest pain. -Seen in the emergency department 7/11 her chest pain. Patient left AGAINST MEDICAL ADVICE.  Consultants:  Nephrology  Cardiology  Psychiatry  Procedures:  Routine hemodialysis  HPI/Subjective: Wants to go home. Denies chest pain or shortness of breath.  Objective: Filed Vitals:   03/08/12 1930 03/08/12 1946 03/08/12 2105 03/09/12 0502  BP: 103/68 139/77 127/75 122/65  Pulse: 116 103 104 89  Temp:  97.3 F (36.3 C) 97.5 F (36.4 C) 98.3 F (36.8 C)  TempSrc:  Oral Oral Oral  Resp: 15 20 18 18   Height:      Weight:  83.1 kg (183 lb 3.2 oz)    SpO2:  99% 97% 100%    Intake/Output Summary (Last 24 hours) at 03/09/12 1018 Last data filed at 03/08/12 1943  Gross per 24 hour  Intake      0 ml  Output   6680 ml  Net  -6680 ml    Exam:   General:  Appears  calm and comfortable. Speech fluent and clear. Alert. Oriented to self, [facility], month, year,  president. Recalls correctly why she is in the hospital.  Cardiovascular: RRR, no m/r/g. Minimal lower extremity edema.  Telemetry: Sinus tachycardia.   Respiratory: CTA bilaterally, no w/r/r. Normal respiratory effort.  Skin: Groin inspected and examined with nurse present [employer]). Incisions appear nearly healed. No exudate seen. No evidence of cellulitis or induration.  Data Reviewed: Basic Metabolic Panel:  Lab 03/08/12 1610 03/07/12 1419 03/06/12 0942  NA 130* 130* 130*  K 3.9 3.8 3.7  CL 91* 91* 91*  CO2 16* 20 18*  GLUCOSE 210* 266* 219*  BUN 69* 63* 79*  CREATININE 7.17* 7.15* 8.57*  CALCIUM 9.6 10.2 10.1  MG -- -- --  PHOS -- -- --   Liver Function Tests:  Lab 03/07/12 1419  AST 11  ALT 9  ALKPHOS 191*  BILITOT 0.1*  PROT 6.9  ALBUMIN 2.4*   CBC:  Lab 03/08/12 0630 03/07/12 1419 03/06/12 0942  WBC 10.6* 11.1* 12.4*  NEUTROABS -- 9.1* 10.7*  HGB 9.1* 9.3* 9.1*  HCT 28.7* 29.0* 28.4*  MCV 100.3* 100.3* 101.1*  PLT 305 312 311   Cardiac Enzymes:  Lab 03/08/12 1700 03/08/12 0630 03/08/12 0113 03/07/12 2354 03/06/12 0942  CKTOTAL 15 19 20 25  --  CKMB 3.6 4.1* 4.3* 4.4* --  CKMBINDEX -- -- -- -- --  TROPONINI <0.30 <0.30 <0.30 <0.30 <0.30    Basename 02/14/12 1447 08/20/11 1010  PROBNP 13271.0* 4201.0*   CBG:  Lab 03/09/12 0606 03/08/12 2104 03/08/12 1106 03/08/12 0644  GLUCAP 115* 186* 135* 204*   Recent Results (from the past 240 hour(s))  WOUND CULTURE     Status: Normal (Preliminary result)   Collection Time   03/07/12 11:12 PM      Component Value Range Status Comment   Specimen Description WOUND RIGHT ANKLE   Final    Special Requests RULE OUT MRSA   Final    Gram Stain     Final    Value: NO WBC SEEN     RARE SQUAMOUS EPITHELIAL CELLS PRESENT     NO ORGANISMS SEEN   Culture     Final    Value: MODERATE STAPHYLOCOCCUS AUREUS     Note: RIFAMPIN AND GENTAMICIN SHOULD NOT BE USED AS SINGLE DRUGS FOR TREATMENT OF STAPH INFECTIONS.      FEW GRAM NEGATIVE RODS   Report Status PENDING   Incomplete   MRSA PCR SCREENING     Status: Normal   Collection Time   03/08/12 12:03 PM      Component Value Range Status Comment   MRSA by PCR NEGATIVE  NEGATIVE Final     Studies: US Venous Img Lower Bilateral  03/07/2012  *RADIOLOGY REPORT* IMPRESSION: Unable to adequately assess the common femoral veins and saphenofemoral junctions due to dressings. Postoperative fluid collection at the proximal left thigh 6.5 x 3.0 x 5.0 cm in size question lymphocele, seroma or resolving hematoma. No evidence of deep venous thrombosis is within the visualized portions of the deep venous systems of the lower extremities as above.  Original Report Authenticated By: Lollie Marrow, M.D.   Dg Chest Port 1 View  03/07/2012  *RADIOLOGY REPORT*   IMPRESSION:  1.  No acute cardiopulmonary disease. 2.  Chronic blunting of the right costophrenic angle with bibasilar heterogeneous opacities, right greater than left, favored to represent atelectasis.  Original Report Authenticated By: Judene Companion.D.  Scheduled Meds:    . ARIPiprazole  10 mg Oral Daily  . cinacalcet  30 mg Oral Q breakfast  . clopidogrel  75 mg Oral Q breakfast  . darbepoetin (ARANESP) injection - DIALYSIS  150 mcg Intravenous Q Sat-HD  . enoxaparin (LOVENOX) injection  40 mg Subcutaneous Q24H  . insulin aspart  0-5 Units Subcutaneous QHS  . insulin aspart  0-9 Units Subcutaneous TID WC  . insulin glargine  33 Units Subcutaneous QHS  . metoprolol      . multivitamin  1 tablet Oral Q24H  . nicotine  21 mg Transdermal Daily  . oxyCODONE      . sevelamer  4,000 mg Oral TID WC  . sodium chloride  3 mL Intravenous Q12H  . DISCONTD: sodium chloride   Intravenous STAT  . DISCONTD: nitroGLYCERIN  0.2 mg Transdermal Daily  . DISCONTD: sodium chloride  3 mL Intravenous Q12H   Continuous Infusions:    . DISCONTD: sodium chloride 40 mL/hr (03/07/12 1540)    Principal Problem:  *Chest  pain Active Problems:  DIABETES  CAD, NATIVE VESSEL  ESRD on dialysis  PVD (peripheral vascular disease)  Tobacco abuse  Diabetic leg ulcer  D-dimer, elevated     Brendia Sacks, MD  [facility] Pager [phone]. If 8PM-8AM, please contact night-coverage at [email or web address], password [facility] 03/09/2012, 10:18 AM  LOS: 2 days

## 2012-03-09 NOTE — Consult Note (Signed)
Reason for Consult: IVC by parent for non compliance and depression Referring Physician: Dr. Army Chaco is an 41 y.o. female.  HPI: patient was seen and chart reviewed and case discussed with Dr. Irene Limbo. Patient has been suffering with depression and multiple medical conditions. She was committed to medical unit from [facility] by her mother petition because she was non compliance with dialysis. Patient has been cooperative with her treatment and positively responded. She has mild symptoms of depression but denied suicidal or homicidal ideations. She has normal speech and thought process. She has no evidence of psychosis. She has intact mental status with mild memory issues especially difficult recalling all her medications. She has sister and mother who were supportive of her. Patient is aware of her physical and mental health conditions and treatment needs. She is also aware of risks of non compliance with her medication. She appreciated her mother's concern and forcing her to be in treatment.   Past Medical History  Diagnosis Date  . DVT (deep venous thrombosis)     DVT HISTORY  . Diabetes mellitus   . Migraine     HISTORY  . Anxiety and depression   . CAD (coronary artery disease)     Last cath 2009:  LAD stent 80% stenosis. Circumflex 60-70% stenosis AV groove, right coronary artery 50-60% stenosis. She did have cutting balloon angioplasty of the LAD lesion into a diagonal. However, there was restenosis of this and it was managed medically.  . Bipolar 1 disorder   . Hyperlipidemia   . Hyperparathyroidism   . PE (pulmonary embolism)     HISTORY  . Dialysis patient   . Anemia   . Renal failure     dialysis [place] t,th sat  . Nephrotic syndrome   . Peripheral vascular disease   . MRSA infection   . Anxiety   . Depression   . Gastroparesis   . Complication of anesthesia   . PONV (postoperative nausea and vomiting)     x3 days post anesth. on 12/19/2011  .  Myocardial infarction     x3 , last one - 10 yrs. ago  . Hypertension     sees Dr. Antoine Poche - , last stress test 11/2011, see result in EPIC, saw Dr. Antoine Poche as well at that time    . Shortness of breath   . Recurrent upper respiratory infection (URI)     Bronchitis 11/2011 - saw Dr. Loney Hering, treating currently /w antibiotic   . GERD (gastroesophageal reflux disease)   . Pneumonia     [facility]- 2012  . Noncompliance of patient with renal dialysis     Past Surgical History  Procedure Date  . Incise and drain abcess     OF THIGHS FROM INSULIN INJECTIONS  . Heart stents   . Portacath placement 2003  . Cataract extraction w/phaco 11/09/2011    Procedure: CATARACT EXTRACTION PHACO AND INTRAOCULAR LENS PLACEMENT (IOC);  Surgeon: Edmon Crape, MD;  Location: [facility] OR;  Service: Ophthalmology;  Laterality: Right;  . Pars plana vitrectomy 11/09/2011    Procedure: PARS PLANA VITRECTOMY WITH 23 GAUGE;  Surgeon: Edmon Crape, MD;  Location: [facility] OR;  Service: Ophthalmology;  Laterality: Right;  Ahmed Valve; Scleral Reinforcement Graft  . Dg av dialysis  shunt access exist*l* or 12/13/11    left arm  . Dialysis cath inserted & portacath      dialysis catheter inserted & portacath d/c'd- 12/19/2011  . Back surgery X 4  x4 times   . Cholecystectomy   . Eye surgery   . Cardiac catheterization   . Aorta - bilateral femoral artery bypass graft 12/28/2011    Procedure: AORTA BIFEMORAL BYPASS GRAFT;  Surgeon: Nada Libman, MD;  Location: [facility] OR;  Service: Vascular;  Laterality: N/A;  AortoBifemoral Bypass using hemasheild 14x7 graft  . Femoral-popliteal bypass graft 12/28/2011    Procedure: BYPASS GRAFT FEMORAL-POPLITEAL ARTERY;  Surgeon: Nada Libman, MD;  Location: [facility] OR;  Service: Vascular;  Laterality: Right;  right femoral popliteal bypass using 6x80 propaten graft  . Insertion of dialysis catheter 02/15/2012    Procedure: INSERTION OF DIALYSIS CATHETER;  Surgeon: Chuck Hint, MD;   Location: [facility] OR;  Service: Vascular;  Laterality: Right;  insertion of dialysis catheter right internal jugular vein,removal of left internal jugular vein dialysis catheter  . Insertion of dialysis catheter 02/21/2012    Procedure: INSERTION OF DIALYSIS CATHETER;  Surgeon: Chuck Hint, MD;  Location: [facility] OR;  Service: Cardiovascular;  Laterality: N/A;  Insertion new dialysis catheter; possible venoplasty of fibrin sheath of  Right IJ catheter  . Venogram 02/21/2012    Procedure: VENOGRAM;  Surgeon: Chuck Hint, MD;  Location: [facility] OR;  Service: Cardiovascular;  Laterality: N/A;  possible venoplasty    Family History  Problem Relation Age of Onset  . Transient ischemic attack Mother   . Hepatitis Sister   . Diabetes type II Other   . Anesthesia problems Neg Hx   . Hypotension Neg Hx   . Malignant hyperthermia Neg Hx   . Pseudochol deficiency Neg Hx     Social History:  reports that she has been smoking Cigarettes.  She has a 40 pack-year smoking history. She has never used smokeless tobacco. She reports that she does not drink alcohol or use illicit drugs.  Allergies:  Allergies  Allergen Reactions  . Compazine (Prochlorperazine) Swelling    Per echart records  . Contrast Media (Iodinated Diagnostic Agents)     Per echart records  . Meperidine Hcl Swelling  . Metformin And Related Other (See Comments)    'just not myself, was told not to take metformin'  . Morphine Swelling  . Nubain (Nalbuphine Hcl) Swelling  . Tobramycin   . Topiramate (Topamax) Swelling    Tongue swelling - per echart records  . Toradol (Ketorolac Tromethamine) Swelling    Medications: I have reviewed the patient's current medications.  Results for orders placed during the hospital encounter of 03/07/12 (from the past 48 hour(s))  APTT     Status: Normal   Collection Time   03/07/12  3:35 PM      Component Value Range Comment   aPTT 33  24 - 37 seconds   WOUND CULTURE     Status: Normal  (Preliminary result)   Collection Time   03/07/12 11:12 PM      Component Value Range Comment   Specimen Description WOUND RIGHT ANKLE      Special Requests RULE OUT MRSA      Gram Stain        Value: NO WBC SEEN     RARE SQUAMOUS EPITHELIAL CELLS PRESENT     NO ORGANISMS SEEN   Culture        Value: MODERATE STAPHYLOCOCCUS AUREUS     Note: RIFAMPIN AND GENTAMICIN SHOULD NOT BE USED AS SINGLE DRUGS FOR TREATMENT OF STAPH INFECTIONS.     FEW GRAM NEGATIVE RODS   Report Status PENDING  CARDIAC PANEL(CRET KIN+CKTOT+MB+TROPI)     Status: Abnormal   Collection Time   03/07/12 11:54 PM      Component Value Range Comment   Total CK 25  7 - 177 U/L    CK, MB 4.4 (*) 0.3 - 4.0 ng/mL    Troponin I <0.30  <0.30 ng/mL    Relative Index RELATIVE INDEX IS INVALID  0.0 - 2.5   D-DIMER, QUANTITATIVE     Status: Abnormal   Collection Time   03/07/12 11:54 PM      Component Value Range Comment   D-Dimer, Quant 3.54 (*) 0.00 - 0.48 ug/mL-FEU   CARDIAC PANEL(CRET KIN+CKTOT+MB+TROPI)     Status: Abnormal   Collection Time   03/08/12  1:13 AM      Component Value Range Comment   Total CK 20  7 - 177 U/L    CK, MB 4.3 (*) 0.3 - 4.0 ng/mL    Troponin I <0.30  <0.30 ng/mL    Relative Index RELATIVE INDEX IS INVALID  0.0 - 2.5   CARDIAC PANEL(CRET KIN+CKTOT+MB+TROPI)     Status: Abnormal   Collection Time   03/08/12  6:30 AM      Component Value Range Comment   Total CK 19  7 - 177 U/L    CK, MB 4.1 (*) 0.3 - 4.0 ng/mL    Troponin I <0.30  <0.30 ng/mL    Relative Index RELATIVE INDEX IS INVALID  0.0 - 2.5   BASIC METABOLIC PANEL     Status: Abnormal   Collection Time   03/08/12  6:30 AM      Component Value Range Comment   Sodium 130 (*) 135 - 145 mEq/L    Potassium 3.9  3.5 - 5.1 mEq/L    Chloride 91 (*) 96 - 112 mEq/L    CO2 16 (*) 19 - 32 mEq/L    Glucose, Bld 210 (*) 70 - 99 mg/dL    BUN 69 (*) 6 - 23 mg/dL    Creatinine, Ser 1.61 (*) 0.50 - 1.10 mg/dL    Calcium 9.6  8.4 - 09.6 mg/dL      GFR calc non Af Amer 6 (*) >90 mL/min    GFR calc Af Amer 7 (*) >90 mL/min   CBC     Status: Abnormal   Collection Time   03/08/12  6:30 AM      Component Value Range Comment   WBC 10.6 (*) 4.0 - 10.5 K/uL    RBC 2.86 (*) 3.87 - 5.11 MIL/uL    Hemoglobin 9.1 (*) 12.0 - 15.0 g/dL    HCT 04.5 (*) 40.9 - 46.0 %    MCV 100.3 (*) 78.0 - 100.0 fL    MCH 31.8  26.0 - 34.0 pg    MCHC 31.7  30.0 - 36.0 g/dL    RDW 81.1 (*) 91.4 - 15.5 %    Platelets 305  150 - 400 K/uL   HEMOGLOBIN A1C     Status: Abnormal   Collection Time   03/08/12  6:30 AM      Component Value Range Comment   Hemoglobin A1C 6.9 (*) <5.7 %    Mean Plasma Glucose 151 (*) <117 mg/dL   GLUCOSE, CAPILLARY     Status: Abnormal   Collection Time   03/08/12  6:44 AM      Component Value Range Comment   Glucose-Capillary 204 (*) 70 - 99 mg/dL   GLUCOSE, CAPILLARY  Status: Abnormal   Collection Time   03/08/12 11:06 AM      Component Value Range Comment   Glucose-Capillary 135 (*) 70 - 99 mg/dL    Comment 1 Notify RN     MRSA PCR SCREENING     Status: Normal   Collection Time   03/08/12 12:03 PM      Component Value Range Comment   MRSA by PCR NEGATIVE  NEGATIVE   CARDIAC PANEL(CRET KIN+CKTOT+MB+TROPI)     Status: Normal   Collection Time   03/08/12  5:00 PM      Component Value Range Comment   Total CK 15  7 - 177 U/L    CK, MB 3.6  0.3 - 4.0 ng/mL    Troponin I <0.30  <0.30 ng/mL    Relative Index RELATIVE INDEX IS INVALID  0.0 - 2.5   GLUCOSE, CAPILLARY     Status: Abnormal   Collection Time   03/08/12  9:04 PM      Component Value Range Comment   Glucose-Capillary 186 (*) 70 - 99 mg/dL   GLUCOSE, CAPILLARY     Status: Abnormal   Collection Time   03/09/12  6:06 AM      Component Value Range Comment   Glucose-Capillary 115 (*) 70 - 99 mg/dL   GLUCOSE, CAPILLARY     Status: Abnormal   Collection Time   03/09/12 11:36 AM      Component Value Range Comment   Glucose-Capillary 145 (*) 70 - 99 mg/dL    Comment  1 Notify RN       US Venous Img Lower Bilateral  03/07/2012  *RADIOLOGY REPORT*  Clinical Data: Bilateral lower extremity edema; post aortobifemoral bypass grafting 3 weeks ago  VENOUS DUPLEX ULTRASOUND OF BILATERAL LOWER EXTREMITIES  Technique:  Gray-scale sonography with graded compression, as well as color Doppler and duplex ultrasound, were performed to evaluate the deep venous system of both lower extremities from the level of the common femoral vein through the popliteal and proximal calf veins.  Spectral Doppler was utilized to evaluate flow at rest and with distal augmentation maneuvers. The common femoral veins are inadequately visualized due to the surgical dressings at these regions.  Comparison:  02/24/2010  Findings:  The deep venous systems appear patent and compressible at the profunda femoral, femoral, popliteal veins.  Common femoral veins are not adequately visualized due to dressings at inguinal regions bilaterally from prior surgery.  The saphenofemoral junctions are not adequately visualized.  No evidence of deep venous thrombus identified within the visualized segments. Spontaneous venous flow is present with intact augmentation. Large hypoechoic collection identified at the proximal right thigh, 6.5 x 3.0 x 5.0 cm in size, question postoperative lymphocele, seroma or resolving hematoma.  IMPRESSION: Unable to adequately assess the common femoral veins and saphenofemoral junctions due to dressings. Postoperative fluid collection at the proximal left thigh 6.5 x 3.0 x 5.0 cm in size question lymphocele, seroma or resolving hematoma. No evidence of deep venous thrombosis is within the visualized portions of the deep venous systems of the lower extremities as above.  Original Report Authenticated By: Lollie Marrow, M.D.    No psychosis and Positive for anxiety, bad mood, depression and sleep disturbance Blood pressure 122/65, pulse 89, temperature 98.3 F (36.8 C), temperature source  Oral, resp. rate 18, height 5\' 10"  (1.778 m), weight 183 lb 3.2 oz (83.1 kg), last menstrual period 09/03/2008, SpO2 100.00%.   Assessment/Plan: Depressive disorder  Multiple medical problems  as per the chart  Recommended out patient psychiatric services at [facility] recovery services in [place], [place] as per scheduled appointment on 03/19/2012 and no new medication recommendation for mental health. Patient has capacity to make medical treatment decision and does not meet criteria for involuntary commitment and will be released from the commitment. Legal document was completed as required.   Ras Kollman,JANARDHAHA R. 03/09/2012, 2:41 PM

## 2012-03-09 NOTE — Progress Notes (Signed)
Patient ID: [name], female   DOB: 21-Jun-1971, 41 y.o.   MRN: [id number]   [facility] Progress Note    Subjective:   No further chest pain- "I am ready to go home"   Objective:   BP 122/65  Pulse 89  Temp 98.3 F (36.8 C) (Oral)  Resp 18  Ht 5\' 10"  (1.778 m)  Wt 83.1 kg (183 lb 3.2 oz)  BMI 26.29 kg/m2  SpO2 100%  LMP 09/03/2008  Physical Exam: Gen: Comfortably sitting on the edge of her bed CVS: Pulse RRR, normal S1 and S2 Resp:CTA bilaterally- no rales/rhonchi NWG:NFAO, flat, NT, BS normal ZHY:QMVHQ-4+ ankle edema  Labs: BMET  Lab 03/08/12 0630 03/07/12 1419 03/06/12 0942  NA 130* 130* 130*  K 3.9 3.8 3.7  CL 91* 91* 91*  CO2 16* 20 18*  GLUCOSE 210* 266* 219*  BUN 69* 63* 79*  CREATININE 7.17* 7.15* 8.57*  ALB -- -- --  CALCIUM 9.6 10.2 10.1  PHOS -- -- --   CBC  Lab 03/08/12 0630 03/07/12 1419 03/06/12 0942  WBC 10.6* 11.1* 12.4*  NEUTROABS -- 9.1* 10.7*  HGB 9.1* 9.3* 9.1*  HCT 28.7* 29.0* 28.4*  MCV 100.3* 100.3* 101.1*  PLT 305 312 311    Medications:      . ARIPiprazole  10 mg Oral Daily  . cinacalcet  30 mg Oral Q breakfast  . clopidogrel  75 mg Oral Q breakfast  . darbepoetin (ARANESP) injection - DIALYSIS  150 mcg Intravenous Q Sat-HD  . enoxaparin (LOVENOX) injection  40 mg Subcutaneous Q24H  . insulin aspart  0-5 Units Subcutaneous QHS  . insulin aspart  0-9 Units Subcutaneous TID WC  . insulin glargine  33 Units Subcutaneous QHS  . metoprolol      . multivitamin  1 tablet Oral Q24H  . nicotine  21 mg Transdermal Daily  . oxyCODONE      . sevelamer  4,000 mg Oral TID WC  . sodium chloride  3 mL Intravenous Q12H  . DISCONTD: sodium chloride   Intravenous STAT  . DISCONTD: nitroGLYCERIN  0.2 mg Transdermal Daily  . DISCONTD: sodium chloride  3 mL Intravenous Q12H     Assessment/ Plan:   1. Chest Pain: atypical for anginal pain- enzymes negative for ACS and pain now resolved.  2. ESRD: plan for scheduled  HD Tuesday (unless acutely needed tomorrow)- she currently appears 3 kg above her EDW of 80kg- able to UF 8Kg yesterday (2Kg above EDW).  3. Anemia: Restarted ESA- resume upon DC at HD center, no overt loss noted and no indications for PRBCs.  4. Metabolic Bone Disease: Will reconcile phosphorus binders and VDRAs  5. AMS: mentation appears to be back to her baseline- currently oriented to place/person/ time.     Zetta Bills, MD 03/09/2012, 9:25 AM

## 2012-03-10 LAB — WOUND CULTURE

## 2012-03-12 ENCOUNTER — Ambulatory Visit: Payer: Medicare Other | Admitting: Physician Assistant

## 2012-03-14 ENCOUNTER — Encounter: Payer: Self-pay | Admitting: Surgery

## 2012-03-17 ENCOUNTER — Encounter: Payer: Self-pay | Admitting: Surgery

## 2012-03-17 ENCOUNTER — Encounter (HOSPITAL_COMMUNITY): Payer: Self-pay

## 2012-03-17 ENCOUNTER — Ambulatory Visit (INDEPENDENT_AMBULATORY_CARE_PROVIDER_SITE_OTHER): Payer: Medicare Other | Admitting: Surgery

## 2012-03-17 ENCOUNTER — Other Ambulatory Visit (HOSPITAL_COMMUNITY): Payer: Self-pay | Admitting: Nephrology

## 2012-03-17 ENCOUNTER — Ambulatory Visit (HOSPITAL_COMMUNITY)
Admission: RE | Admit: 2012-03-17 | Discharge: 2012-03-17 | Disposition: A | Discharge status: Home / Self Care | Payer: Medicare Other | Source: Ambulatory Visit | Attending: Nephrology | Admitting: Nephrology

## 2012-03-17 VITALS — BP 158/72 | HR 91 | Temp 97.7°F | Ht 70.0 in | Wt 183.0 lb

## 2012-03-17 VITALS — Ht 70.0 in | Wt 183.0 lb

## 2012-03-17 DIAGNOSIS — I70219 Atherosclerosis of native arteries of extremities with intermittent claudication, unspecified extremity: Secondary | ICD-10-CM

## 2012-03-17 DIAGNOSIS — N186 End stage renal disease: Secondary | ICD-10-CM

## 2012-03-17 DIAGNOSIS — Z992 Dependence on renal dialysis: Secondary | ICD-10-CM

## 2012-03-17 DIAGNOSIS — I7025 Atherosclerosis of native arteries of other extremities with ulceration: Secondary | ICD-10-CM | POA: Insufficient documentation

## 2012-03-17 DIAGNOSIS — I739 Peripheral vascular disease, unspecified: Secondary | ICD-10-CM | POA: Insufficient documentation

## 2012-03-17 DIAGNOSIS — Z452 Encounter for adjustment and management of vascular access device: Secondary | ICD-10-CM | POA: Insufficient documentation

## 2012-03-17 LAB — GLUCOSE, CAPILLARY: Glucose-Capillary: 194 mg/dL — ABNORMAL HIGH (ref 70–99)

## 2012-03-17 IMAGING — XA IR REMOVAL TUNNELED CV CATH
1 series · 1 of 1 positions shown · non-contrast
Comparison: none

CLINICAL DATA: Renal failure.  The patient requires removal of a
tunneled dialysis catheter due to MRSA infection.

[Series 1: run · 1 of 1 slices shown]
[im 1/1]
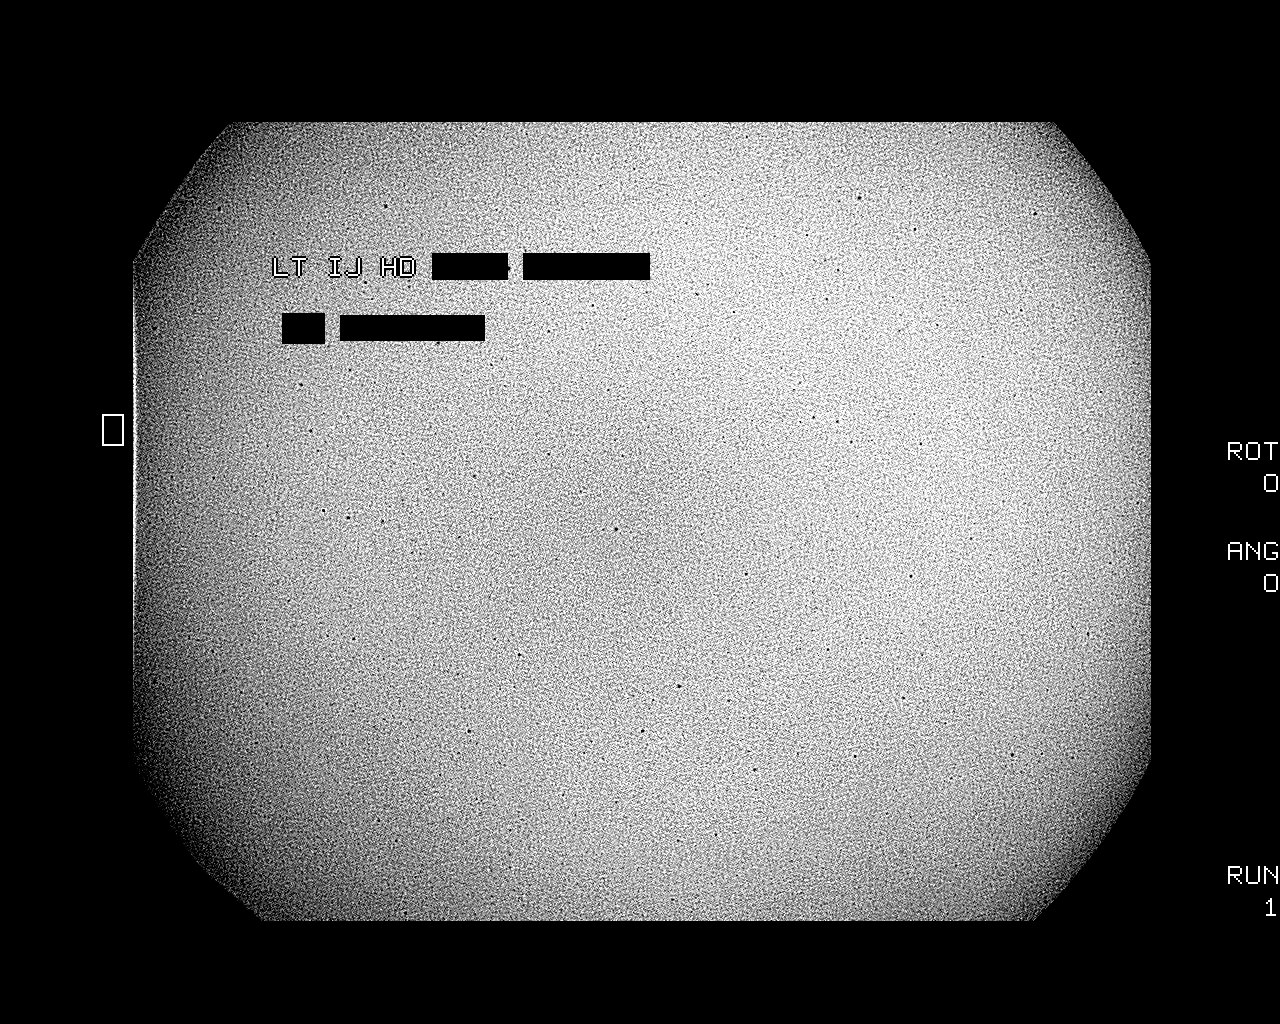

[1 of 1 positions shown; findings below may reference images not displayed]

REMOVAL OF TUNNELED CENTRAL VENOUS CATHETER

The left chest dialysis catheter site was prepped with
chlorhexidine.  A sterile gown and gloves were worn during the
procedure.  Local anesthesia was provided with 1% lidocaine.

Utilizing sharp and blunt dissection, the subcutaneous cuff of the
dialysis catheter was freed.  The catheter was then successfully
removed in its entirety.  A sterile dressing was applied over the
catheter exit site.
IMPRESSION: Removal of tunneled dialysis catheter utilizing sharp and blunt
dissection.  The procedure was uncomplicated.

## 2012-03-17 MED ORDER — CEFAZOLIN SODIUM 1-5 GM-% IV SOLN
INTRAVENOUS | Status: AC
  Filled 2012-03-17: qty 50

## 2012-03-17 MED ORDER — CHLORHEXIDINE GLUCONATE 4 % EX LIQD
CUTANEOUS | Status: AC
  Filled 2012-03-17: qty 30

## 2012-03-17 MED ORDER — HEPARIN SODIUM (PORCINE) 1000 UNIT/ML IJ SOLN
INTRAMUSCULAR | Status: AC
  Filled 2012-03-17: qty 1

## 2012-03-17 MED ORDER — CEFAZOLIN SODIUM 1-5 GM-% IV SOLN: 1.0000 g | Freq: Once | INTRAVENOUS | Status: DC

## 2012-03-17 NOTE — Procedures (Signed)
Successful fluoroscopic guided exchange of existing right jugular approach dialysis catheter.  Catheter is ready for immediate use.  No immediate post procedural complications.  

## 2012-03-17 NOTE — Progress Notes (Signed)
The patient is here today for followup. She is status post aortobifemoral bypass graft and right femoral-popliteal bypass graft in the same setting for 2 nonhealing wounds on the right leg. She has been admitted to the hospital multiple times since her operation for fluid overload as well as wound healing issues. She has been doing dressing changes to her groin incisions which have essentially healed. There is a small 1 cm defect in the right groin. The large ulcer or at the her knee level has healed and the large ulcer on the ankle has nearly healed.  Overall she has dramatically improved since her hospitalization. She continues to have edema bilaterally which I feel is multifactorial, both related to her groin incision as well as do to her renal failure. She pointed out 2 areas along her vein harvest tract which are raised. These feel like they are lymphocele. They do not appear infected.  I have scheduled the patient to come back to see me in early September she will have her first ultrasound evaluation at that time

## 2012-03-17 NOTE — H&P (Signed)
[name] is an 41 y.o. female.   Chief Complaint: arterial line of HD catheter clogged - failed TPA indwell.  HPI: ESRD - has had issues with non-maturing HD access. This catheter is not functioning from arterial side and is in need of exchange.   Past Medical History  Diagnosis Date  . DVT (deep venous thrombosis)     DVT HISTORY  . Diabetes mellitus   . Migraine     HISTORY  . Anxiety and depression   . CAD (coronary artery disease)     Last cath 2009:  LAD stent 80% stenosis. Circumflex 60-70% stenosis AV groove, right coronary artery 50-60% stenosis. She did have cutting balloon angioplasty of the LAD lesion into a diagonal. However, there was restenosis of this and it was managed medically.  . Bipolar 1 disorder   . Hyperlipidemia   . Hyperparathyroidism   . PE (pulmonary embolism)     HISTORY  . Dialysis patient   . Anemia   . Renal failure     dialysis [place] t,th sat  . Nephrotic syndrome   . Peripheral vascular disease   . MRSA infection   . Anxiety   . Depression   . Gastroparesis   . Complication of anesthesia   . PONV (postoperative nausea and vomiting)     x3 days post anesth. on 12/19/2011  . Myocardial infarction     x3 , last one - 10 yrs. ago  . Hypertension     sees Dr. Antoine Poche - [place], last stress test 11/2011, see result in EPIC, saw Dr. Antoine Poche as well at that time    . Shortness of breath   . Recurrent upper respiratory infection (URI)     Bronchitis 11/2011 - saw Dr. Loney Hering, treating currently /w antibiotic   . GERD (gastroesophageal reflux disease)   . Pneumonia     APH- 2012  . Noncompliance of patient with renal dialysis     Past Surgical History  Procedure Date  . Incise and drain abcess     OF THIGHS FROM INSULIN INJECTIONS  . Heart stents   . Portacath placement 2003  . Cataract extraction w/phaco 11/09/2011    Procedure: CATARACT EXTRACTION PHACO AND INTRAOCULAR LENS PLACEMENT (IOC);  Surgeon: Edmon Crape, MD;  Location: [facility] OR;   Service: Ophthalmology;  Laterality: Right;  . Pars plana vitrectomy 11/09/2011    Procedure: PARS PLANA VITRECTOMY WITH 23 GAUGE;  Surgeon: Edmon Crape, MD;  Location: [facility] OR;  Service: Ophthalmology;  Laterality: Right;  Ahmed Valve; Scleral Reinforcement Graft  . Dg av dialysis  shunt access exist*l* or 12/13/11    left arm  . Dialysis cath inserted & portacath      dialysis catheter inserted & portacath d/c'd- 12/19/2011  . Back surgery X 4    x4 times   . Cholecystectomy   . Eye surgery   . Cardiac catheterization   . Aorta - bilateral femoral artery bypass graft 12/28/2011    Procedure: AORTA BIFEMORAL BYPASS GRAFT;  Surgeon: Nada Libman, MD;  Location: [facility] OR;  Service: Vascular;  Laterality: N/A;  AortoBifemoral Bypass using hemasheild 14x7 graft  . Femoral-popliteal bypass graft 12/28/2011    Procedure: BYPASS GRAFT FEMORAL-POPLITEAL ARTERY;  Surgeon: Nada Libman, MD;  Location: [facility] OR;  Service: Vascular;  Laterality: Right;  right femoral popliteal bypass using 6x80 propaten graft  . Insertion of dialysis catheter 02/15/2012    Procedure: INSERTION OF DIALYSIS CATHETER;  Surgeon: Di Kindle  Edilia Bo, MD;  Location: [facility] OR;  Service: Vascular;  Laterality: Right;  insertion of dialysis catheter right internal jugular vein,removal of left internal jugular vein dialysis catheter  . Insertion of dialysis catheter 02/21/2012    Procedure: INSERTION OF DIALYSIS CATHETER;  Surgeon: Chuck Hint, MD;  Location: [facility] OR;  Service: Cardiovascular;  Laterality: N/A;  Insertion new dialysis catheter; possible venoplasty of fibrin sheath of  Right IJ catheter  . Venogram 02/21/2012    Procedure: VENOGRAM;  Surgeon: Chuck Hint, MD;  Location: [facility] OR;  Service: Cardiovascular;  Laterality: N/A;  possible venoplasty    Family History  Problem Relation Age of Onset  . Transient ischemic attack Mother   . Hepatitis Sister   . Diabetes type II Other   . Anesthesia problems Neg Hx    . Hypotension Neg Hx   . Malignant hyperthermia Neg Hx   . Pseudochol deficiency Neg Hx    Social History:  reports that she has been smoking Cigarettes.  She has a 20 pack-year smoking history. She has never used smokeless tobacco. She reports that she does not drink alcohol or use illicit drugs.  Allergies:  Allergies  Allergen Reactions  . Compazine (Prochlorperazine) Swelling    Per echart records  . Contrast Media (Iodinated Diagnostic Agents)     Per echart records  . Meperidine Hcl Swelling  . Metformin And Related Other (See Comments)    'just not myself, was told not to take metformin'  . Morphine Swelling  . Nubain (Nalbuphine Hcl) Swelling  . Tobramycin   . Topiramate (Topamax) Swelling    Tongue swelling - per echart records  . Toradol (Ketorolac Tromethamine) Swelling     Results for orders placed during the hospital encounter of 03/17/12 (from the past 48 hour(s))  GLUCOSE, CAPILLARY     Status: Abnormal   Collection Time   03/17/12 12:55 PM      Component Value Range Comment   Glucose-Capillary 194 (*) 70 - 99 mg/dL     Review of Systems  Constitutional: Positive for malaise/fatigue. Negative for fever and chills.  Respiratory: Positive for shortness of breath and wheezing. Negative for cough and hemoptysis.   Cardiovascular: Negative for chest pain.  Gastrointestinal: Positive for heartburn, nausea and vomiting.  Musculoskeletal: Positive for back pain.  Neurological: Positive for weakness.  Psychiatric/Behavioral: The patient is nervous/anxious and has insomnia.     Height 5\' 10"  (1.778 m), weight 183 lb (83.008 kg), last menstrual period 09/03/2008. Physical Exam  Constitutional: She is oriented to person, place, and time. She appears well-developed. No distress.  HENT:  Head: Normocephalic and atraumatic.  Cardiovascular: Normal rate.  Exam reveals no gallop and no friction rub.   No murmur heard. Respiratory: Effort normal. No respiratory  distress. She has wheezes. She has rales.  GI: Soft.  Musculoskeletal: Normal range of motion. She exhibits no edema.  Neurological: She is alert and oriented to person, place, and time.       Sleepy - but responds to all questions appropriately -   Skin: Skin is warm and dry.  Psychiatric: Judgment and thought content normal.     Assessment/Plan HD catheter with malfunctioning arterial port - in need of exchange as patient has no other HD access as this time due to non-maturing HD access ? From continued smoking. Patient and her mother have been talked with in detail regarding this procedure and reasons for same.  Plan to proceed to HD after exchange of catheter -  patient is not NPO for moderate sedation at this time.   CAMPBELL,PAMELA D 03/17/2012, 1:14 PM

## 2012-03-18 ENCOUNTER — Telehealth (HOSPITAL_COMMUNITY): Payer: Self-pay | Admitting: *Deleted

## 2012-03-20 ENCOUNTER — Other Ambulatory Visit (HOSPITAL_COMMUNITY): Payer: Self-pay | Admitting: Nephrology

## 2012-03-20 ENCOUNTER — Other Ambulatory Visit (HOSPITAL_COMMUNITY): Payer: Self-pay | Admitting: Diagnostic Radiology

## 2012-03-20 DIAGNOSIS — N186 End stage renal disease: Secondary | ICD-10-CM

## 2012-03-21 ENCOUNTER — Ambulatory Visit (HOSPITAL_COMMUNITY)
Admission: RE | Admit: 2012-03-21 | Discharge: 2012-03-21 | Disposition: A | Discharge status: Home / Self Care | Payer: Medicare Other | Source: Ambulatory Visit | Attending: Nephrology | Admitting: Nephrology

## 2012-03-21 DIAGNOSIS — N186 End stage renal disease: Secondary | ICD-10-CM

## 2012-03-27 ENCOUNTER — Other Ambulatory Visit (HOSPITAL_COMMUNITY): Payer: Self-pay | Admitting: Nephrology

## 2012-03-27 DIAGNOSIS — N186 End stage renal disease: Secondary | ICD-10-CM

## 2012-03-28 ENCOUNTER — Encounter (HOSPITAL_COMMUNITY): Payer: Self-pay

## 2012-03-28 ENCOUNTER — Ambulatory Visit (HOSPITAL_COMMUNITY)
Admission: RE | Admit: 2012-03-28 | Discharge: 2012-03-28 | Disposition: A | Discharge status: Home / Self Care | Payer: Medicare Other | Source: Ambulatory Visit | Attending: Nephrology | Admitting: Nephrology

## 2012-03-28 ENCOUNTER — Other Ambulatory Visit (HOSPITAL_COMMUNITY): Payer: Self-pay | Admitting: Nephrology

## 2012-03-28 VITALS — BP 183/88 | HR 89 | Resp 20

## 2012-03-28 DIAGNOSIS — T82898A Other specified complication of vascular prosthetic devices, implants and grafts, initial encounter: Secondary | ICD-10-CM | POA: Insufficient documentation

## 2012-03-28 DIAGNOSIS — E119 Type 2 diabetes mellitus without complications: Secondary | ICD-10-CM | POA: Insufficient documentation

## 2012-03-28 DIAGNOSIS — E213 Hyperparathyroidism, unspecified: Secondary | ICD-10-CM | POA: Insufficient documentation

## 2012-03-28 DIAGNOSIS — E785 Hyperlipidemia, unspecified: Secondary | ICD-10-CM | POA: Insufficient documentation

## 2012-03-28 DIAGNOSIS — N186 End stage renal disease: Secondary | ICD-10-CM

## 2012-03-28 DIAGNOSIS — Z992 Dependence on renal dialysis: Secondary | ICD-10-CM | POA: Insufficient documentation

## 2012-03-28 DIAGNOSIS — Y849 Medical procedure, unspecified as the cause of abnormal reaction of the patient, or of later complication, without mention of misadventure at the time of the procedure: Secondary | ICD-10-CM | POA: Insufficient documentation

## 2012-03-28 DIAGNOSIS — I120 Hypertensive chronic kidney disease with stage 5 chronic kidney disease or end stage renal disease: Secondary | ICD-10-CM | POA: Insufficient documentation

## 2012-03-28 MED ORDER — GELATIN ABSORBABLE 12-7 MM EX MISC
CUTANEOUS | Status: AC
  Filled 2012-03-28: qty 1

## 2012-03-28 MED ORDER — HEPARIN SODIUM (PORCINE) 1000 UNIT/ML IJ SOLN
INTRAMUSCULAR | Status: AC
  Filled 2012-03-28: qty 1

## 2012-03-28 MED ORDER — CHLORHEXIDINE GLUCONATE 4 % EX LIQD
CUTANEOUS | Status: AC
  Filled 2012-03-28: qty 45

## 2012-03-28 MED ORDER — ONDANSETRON HCL 4 MG/2ML IJ SOLN
INTRAMUSCULAR | Status: AC
  Filled 2012-03-28: qty 2

## 2012-03-28 MED ORDER — CEFAZOLIN SODIUM 1-5 GM-% IV SOLN
INTRAVENOUS | Status: AC
  Filled 2012-03-28: qty 50

## 2012-03-28 MED ORDER — CEFAZOLIN SODIUM 1-5 GM-% IV SOLN
1.0000 g | Freq: Once | INTRAVENOUS | Status: AC
  Administered 2012-03-28: 1 g via INTRAVENOUS

## 2012-03-28 MED ORDER — ONDANSETRON HCL 4 MG/2ML IJ SOLN
4.0000 mg | Freq: Once | INTRAMUSCULAR | Status: AC
  Administered 2012-03-28: 4 mg via INTRAVENOUS

## 2012-03-28 NOTE — ED Notes (Signed)
No sedation, cath exchange.

## 2012-03-28 NOTE — Procedures (Signed)
Successful rt IJ HD CATH EXCHG TIPS SVC/RA NO COMP STABLE READY FOR USE

## 2012-03-28 NOTE — H&P (Signed)
[name] is an 41 y.o. female.   Chief Complaint:HD catheter clogged HPI: ESRD - has had issues with non-maturing HD access. This catheter is not functioning from arterial side and is in need of exchange.  Pt was to have injection to see what is wrong but took contrast allergy pre-meds last pm and has been with N/V all night. She does not feel she was able to hold down any of her pre-med Prednisone. She will need catheter exchange without the use of contrast.  Past Medical History  Diagnosis Date  . DVT (deep venous thrombosis)     DVT HISTORY  . Diabetes mellitus   . Migraine     HISTORY  . Anxiety and depression   . CAD (coronary artery disease)     Last cath 2009:  LAD stent 80% stenosis. Circumflex 60-70% stenosis AV groove, right coronary artery 50-60% stenosis. She did have cutting balloon angioplasty of the LAD lesion into a diagonal. However, there was restenosis of this and it was managed medically.  . Bipolar 1 disorder   . Hyperlipidemia   . Hyperparathyroidism   . PE (pulmonary embolism)     HISTORY  . Dialysis patient   . Anemia   . Renal failure     dialysis [place] t,th sat  . Nephrotic syndrome   . Peripheral vascular disease   . MRSA infection   . Anxiety   . Depression   . Gastroparesis   . Complication of anesthesia   . PONV (postoperative nausea and vomiting)     x3 days post anesth. on 12/19/2011  . Myocardial infarction     x3 , last one - 10 yrs. ago  . Hypertension     sees Dr. Antoine Poche - [place], last stress test 11/2011, see result in EPIC, saw Dr. Antoine Poche as well at that time    . Shortness of breath   . Recurrent upper respiratory infection (URI)     Bronchitis 11/2011 - saw Dr. Loney Hering, treating currently /w antibiotic   . GERD (gastroesophageal reflux disease)   . Pneumonia     APH- 2012  . Noncompliance of patient with renal dialysis     Past Surgical History  Procedure Date  . Incise and drain abcess     OF THIGHS FROM INSULIN  INJECTIONS  . Heart stents   . Portacath placement 2003  . Cataract extraction w/phaco 11/09/2011    Procedure: CATARACT EXTRACTION PHACO AND INTRAOCULAR LENS PLACEMENT (IOC);  Surgeon: Edmon Crape, MD;  Location: [facility] OR;  Service: Ophthalmology;  Laterality: Right;  . Pars plana vitrectomy 11/09/2011    Procedure: PARS PLANA VITRECTOMY WITH 23 GAUGE;  Surgeon: Edmon Crape, MD;  Location: [facility] OR;  Service: Ophthalmology;  Laterality: Right;  Ahmed Valve; Scleral Reinforcement Graft  . Dg av dialysis  shunt access exist*l* or 12/13/11    left arm  . Dialysis cath inserted & portacath      dialysis catheter inserted & portacath d/c'd- 12/19/2011  . Back surgery X 4    x4 times   . Cholecystectomy   . Eye surgery   . Cardiac catheterization   . Aorta - bilateral femoral artery bypass graft 12/28/2011    Procedure: AORTA BIFEMORAL BYPASS GRAFT;  Surgeon: Nada Libman, MD;  Location: [facility] OR;  Service: Vascular;  Laterality: N/A;  AortoBifemoral Bypass using hemasheild 14x7 graft  . Femoral-popliteal bypass graft 12/28/2011    Procedure: BYPASS GRAFT FEMORAL-POPLITEAL ARTERY;  Surgeon: Fran Lowes  Myra Gianotti, MD;  Location: [facility] OR;  Service: Vascular;  Laterality: Right;  right femoral popliteal bypass using 6x80 propaten graft  . Insertion of dialysis catheter 02/15/2012    Procedure: INSERTION OF DIALYSIS CATHETER;  Surgeon: Chuck Hint, MD;  Location: [facility] OR;  Service: Vascular;  Laterality: Right;  insertion of dialysis catheter right internal jugular vein,removal of left internal jugular vein dialysis catheter  . Insertion of dialysis catheter 02/21/2012    Procedure: INSERTION OF DIALYSIS CATHETER;  Surgeon: Chuck Hint, MD;  Location: [facility] OR;  Service: Cardiovascular;  Laterality: N/A;  Insertion new dialysis catheter; possible venoplasty of fibrin sheath of  Right IJ catheter  . Venogram 02/21/2012    Procedure: VENOGRAM;  Surgeon: Chuck Hint, MD;  Location: [facility] OR;  Service:  Cardiovascular;  Laterality: N/A;  possible venoplasty    Family History  Problem Relation Age of Onset  . Transient ischemic attack Mother   . Hepatitis Sister   . Diabetes type II Other   . Anesthesia problems Neg Hx   . Hypotension Neg Hx   . Malignant hyperthermia Neg Hx   . Pseudochol deficiency Neg Hx    Social History:  reports that she has been smoking Cigarettes.  She has a 20 pack-year smoking history. She has never used smokeless tobacco. She reports that she does not drink alcohol or use illicit drugs.  Allergies:  Allergies  Allergen Reactions  . Compazine (Prochlorperazine) Swelling    Per echart records  . Contrast Media (Iodinated Diagnostic Agents)     Per echart records  . Meperidine Hcl Swelling  . Metformin And Related Other (See Comments)    'just not myself, was told not to take metformin'  . Morphine Swelling  . Nubain (Nalbuphine Hcl) Swelling  . Tobramycin   . Topiramate (Topamax) Swelling    Tongue swelling - per echart records  . Toradol (Ketorolac Tromethamine) Swelling     No results found for this or any previous visit (from the past 48 hour(s)).  Review of Systems  Constitutional: Positive for malaise/fatigue. Negative for fever and chills.  Respiratory: Positive for shortness of breath and wheezing. Negative for cough and hemoptysis.   Cardiovascular: Negative for chest pain.  Gastrointestinal: Positive for heartburn, nausea and vomiting.  Musculoskeletal: Positive for back pain.  Neurological: Positive for weakness.  Psychiatric/Behavioral: The patient is nervous/anxious and has insomnia.     Last menstrual period 09/03/2008. Physical Exam  Constitutional: She is oriented to person, place, and time. She appears well-developed. No distress.  HENT:  Head: Normocephalic and atraumatic.  Cardiovascular: Normal rate.  Exam reveals no gallop and no friction rub.   No murmur heard. Respiratory: Effort normal. No respiratory distress. She  has wheezes. She has rales.  GI: Soft.  Musculoskeletal: Normal range of motion. She exhibits no edema.  Neurological: She is alert and oriented to person, place, and time.       Sleepy - but responds to all questions appropriately -   Skin: Skin is warm and dry.  Psychiatric: Judgment and thought content normal.     Assessment/Plan HD catheter with malfunctioning port - in need of exchange as patient has no other HD access as this time due to non-maturing HD access ? From continued smoking. Patient has been talked with in detail regarding this procedure and reasons for same.  Plan to proceed to HD after exchange of catheter - patient is not NPO for moderate sedation at this time.   Teryl Gubler,  Khriz Liddy 03/28/2012, 8:37 AM

## 2012-03-28 NOTE — ED Notes (Signed)
IV d/c'd.  REady for D/C.  Pt awaiting family for ride home.  Sleeping in nurses station, did not sleep last night due to N/V last night.  Home nurse at side.  To go to [facility] today after d/c.

## 2012-03-28 NOTE — ED Notes (Addendum)
C/O nausea.  Pt had N/V last night and continues today.  Chronic nausea due to gastroparesis per pt and home nurse.

## 2012-04-08 ENCOUNTER — Telehealth (INDEPENDENT_AMBULATORY_CARE_PROVIDER_SITE_OTHER): Payer: Self-pay | Admitting: Internal Medicine

## 2012-04-08 NOTE — Telephone Encounter (Signed)
Phenergan 25mg  IM x 5 doses called to her drug store.

## 2012-04-14 ENCOUNTER — Other Ambulatory Visit (INDEPENDENT_AMBULATORY_CARE_PROVIDER_SITE_OTHER): Payer: Self-pay | Admitting: Internal Medicine

## 2012-04-19 ENCOUNTER — Other Ambulatory Visit: Payer: Self-pay | Admitting: Medical

## 2012-04-22 ENCOUNTER — Inpatient Hospital Stay (HOSPITAL_COMMUNITY)
Admission: EM | Admit: 2012-04-22 | Discharge: 2012-05-05 | DRG: 299 | Disposition: A | Discharge status: Home Health | Payer: Medicare Other | Attending: Internal Medicine | Admitting: Internal Medicine

## 2012-04-22 ENCOUNTER — Emergency Department (HOSPITAL_COMMUNITY): Payer: Medicare Other

## 2012-04-22 ENCOUNTER — Inpatient Hospital Stay (HOSPITAL_COMMUNITY): Payer: Medicare Other

## 2012-04-22 ENCOUNTER — Encounter (HOSPITAL_COMMUNITY): Payer: Self-pay | Admitting: Emergency Medicine

## 2012-04-22 DIAGNOSIS — Z992 Dependence on renal dialysis: Secondary | ICD-10-CM

## 2012-04-22 DIAGNOSIS — N039 Chronic nephritic syndrome with unspecified morphologic changes: Secondary | ICD-10-CM | POA: Diagnosis present

## 2012-04-22 DIAGNOSIS — Z794 Long term (current) use of insulin: Secondary | ICD-10-CM

## 2012-04-22 DIAGNOSIS — Z79899 Other long term (current) drug therapy: Secondary | ICD-10-CM

## 2012-04-22 DIAGNOSIS — I120 Hypertensive chronic kidney disease with stage 5 chronic kidney disease or end stage renal disease: Secondary | ICD-10-CM | POA: Diagnosis present

## 2012-04-22 DIAGNOSIS — G929 Unspecified toxic encephalopathy: Secondary | ICD-10-CM | POA: Diagnosis present

## 2012-04-22 DIAGNOSIS — R0602 Shortness of breath: Secondary | ICD-10-CM

## 2012-04-22 DIAGNOSIS — E875 Hyperkalemia: Secondary | ICD-10-CM | POA: Diagnosis present

## 2012-04-22 DIAGNOSIS — M629 Disorder of muscle, unspecified: Secondary | ICD-10-CM

## 2012-04-22 DIAGNOSIS — L97919 Non-pressure chronic ulcer of unspecified part of right lower leg with unspecified severity: Secondary | ICD-10-CM | POA: Diagnosis present

## 2012-04-22 DIAGNOSIS — Z91158 Patient's noncompliance with renal dialysis for other reason: Secondary | ICD-10-CM

## 2012-04-22 DIAGNOSIS — E8779 Other fluid overload: Secondary | ICD-10-CM | POA: Diagnosis present

## 2012-04-22 DIAGNOSIS — F319 Bipolar disorder, unspecified: Secondary | ICD-10-CM

## 2012-04-22 DIAGNOSIS — N390 Urinary tract infection, site not specified: Secondary | ICD-10-CM

## 2012-04-22 DIAGNOSIS — I871 Compression of vein: Secondary | ICD-10-CM | POA: Diagnosis present

## 2012-04-22 DIAGNOSIS — Z9861 Coronary angioplasty status: Secondary | ICD-10-CM

## 2012-04-22 DIAGNOSIS — J90 Pleural effusion, not elsewhere classified: Secondary | ICD-10-CM | POA: Diagnosis present

## 2012-04-22 DIAGNOSIS — L97809 Non-pressure chronic ulcer of other part of unspecified lower leg with unspecified severity: Secondary | ICD-10-CM | POA: Diagnosis present

## 2012-04-22 DIAGNOSIS — K3184 Gastroparesis: Secondary | ICD-10-CM | POA: Diagnosis present

## 2012-04-22 DIAGNOSIS — E1159 Type 2 diabetes mellitus with other circulatory complications: Secondary | ICD-10-CM

## 2012-04-22 DIAGNOSIS — D631 Anemia in chronic kidney disease: Secondary | ICD-10-CM | POA: Diagnosis present

## 2012-04-22 DIAGNOSIS — Z86711 Personal history of pulmonary embolism: Secondary | ICD-10-CM

## 2012-04-22 DIAGNOSIS — R4182 Altered mental status, unspecified: Secondary | ICD-10-CM

## 2012-04-22 DIAGNOSIS — I739 Peripheral vascular disease, unspecified: Secondary | ICD-10-CM

## 2012-04-22 DIAGNOSIS — I10 Essential (primary) hypertension: Secondary | ICD-10-CM | POA: Diagnosis present

## 2012-04-22 DIAGNOSIS — I82409 Acute embolism and thrombosis of unspecified deep veins of unspecified lower extremity: Secondary | ICD-10-CM | POA: Diagnosis present

## 2012-04-22 DIAGNOSIS — N186 End stage renal disease: Secondary | ICD-10-CM | POA: Diagnosis present

## 2012-04-22 DIAGNOSIS — T450X5A Adverse effect of antiallergic and antiemetic drugs, initial encounter: Secondary | ICD-10-CM | POA: Diagnosis present

## 2012-04-22 DIAGNOSIS — E785 Hyperlipidemia, unspecified: Secondary | ICD-10-CM | POA: Diagnosis present

## 2012-04-22 DIAGNOSIS — A419 Sepsis, unspecified organism: Secondary | ICD-10-CM

## 2012-04-22 DIAGNOSIS — G92 Toxic encephalopathy: Secondary | ICD-10-CM | POA: Diagnosis present

## 2012-04-22 DIAGNOSIS — Z9115 Patient's noncompliance with renal dialysis: Secondary | ICD-10-CM

## 2012-04-22 DIAGNOSIS — E119 Type 2 diabetes mellitus without complications: Secondary | ICD-10-CM

## 2012-04-22 DIAGNOSIS — Z86718 Personal history of other venous thrombosis and embolism: Secondary | ICD-10-CM

## 2012-04-22 DIAGNOSIS — I70219 Atherosclerosis of native arteries of extremities with intermittent claudication, unspecified extremity: Secondary | ICD-10-CM

## 2012-04-22 DIAGNOSIS — D649 Anemia, unspecified: Secondary | ICD-10-CM

## 2012-04-22 DIAGNOSIS — I252 Old myocardial infarction: Secondary | ICD-10-CM

## 2012-04-22 DIAGNOSIS — R7989 Other specified abnormal findings of blood chemistry: Secondary | ICD-10-CM

## 2012-04-22 DIAGNOSIS — F411 Generalized anxiety disorder: Secondary | ICD-10-CM | POA: Diagnosis present

## 2012-04-22 DIAGNOSIS — R072 Precordial pain: Secondary | ICD-10-CM

## 2012-04-22 DIAGNOSIS — J189 Pneumonia, unspecified organism: Secondary | ICD-10-CM

## 2012-04-22 DIAGNOSIS — F17200 Nicotine dependence, unspecified, uncomplicated: Secondary | ICD-10-CM | POA: Diagnosis present

## 2012-04-22 DIAGNOSIS — E1143 Type 2 diabetes mellitus with diabetic autonomic (poly)neuropathy: Secondary | ICD-10-CM

## 2012-04-22 DIAGNOSIS — R079 Chest pain, unspecified: Secondary | ICD-10-CM

## 2012-04-22 DIAGNOSIS — I2510 Atherosclerotic heart disease of native coronary artery without angina pectoris: Secondary | ICD-10-CM

## 2012-04-22 DIAGNOSIS — E11622 Type 2 diabetes mellitus with other skin ulcer: Secondary | ICD-10-CM

## 2012-04-22 DIAGNOSIS — M2420 Disorder of ligament, unspecified site: Secondary | ICD-10-CM

## 2012-04-22 DIAGNOSIS — N19 Unspecified kidney failure: Secondary | ICD-10-CM

## 2012-04-22 DIAGNOSIS — Z720 Tobacco use: Secondary | ICD-10-CM

## 2012-04-22 DIAGNOSIS — J9600 Acute respiratory failure, unspecified whether with hypoxia or hypercapnia: Secondary | ICD-10-CM | POA: Diagnosis not present

## 2012-04-22 DIAGNOSIS — K219 Gastro-esophageal reflux disease without esophagitis: Secondary | ICD-10-CM | POA: Diagnosis present

## 2012-04-22 DIAGNOSIS — L98499 Non-pressure chronic ulcer of skin of other sites with unspecified severity: Secondary | ICD-10-CM

## 2012-04-22 DIAGNOSIS — E118 Type 2 diabetes mellitus with unspecified complications: Secondary | ICD-10-CM | POA: Diagnosis present

## 2012-04-22 DIAGNOSIS — I6529 Occlusion and stenosis of unspecified carotid artery: Secondary | ICD-10-CM

## 2012-04-22 DIAGNOSIS — Z7902 Long term (current) use of antithrombotics/antiplatelets: Secondary | ICD-10-CM

## 2012-04-22 DIAGNOSIS — N2581 Secondary hyperparathyroidism of renal origin: Secondary | ICD-10-CM

## 2012-04-22 DIAGNOSIS — Z8614 Personal history of Methicillin resistant Staphylococcus aureus infection: Secondary | ICD-10-CM

## 2012-04-22 DIAGNOSIS — R0902 Hypoxemia: Secondary | ICD-10-CM

## 2012-04-22 DIAGNOSIS — R0989 Other specified symptoms and signs involving the circulatory and respiratory systems: Secondary | ICD-10-CM

## 2012-04-22 LAB — BASIC METABOLIC PANEL
BUN: 84 mg/dL — ABNORMAL HIGH (ref 6–23)
Calcium: 9.7 mg/dL (ref 8.4–10.5)
Creatinine, Ser: 6.38 mg/dL — ABNORMAL HIGH (ref 0.50–1.10)
GFR calc Af Amer: 8 mL/min — ABNORMAL LOW (ref >90)
GFR calc non Af Amer: 7 mL/min — ABNORMAL LOW (ref >90)
Potassium: 5.7 mEq/L — ABNORMAL HIGH (ref 3.5–5.1)

## 2012-04-22 LAB — PROTIME-INR: Prothrombin Time: 14.5 seconds (ref 11.6–15.2)

## 2012-04-22 LAB — CBC
HCT: 35.3 % — ABNORMAL LOW (ref 36.0–46.0)
MCH: 31.3 pg (ref 26.0–34.0)
MCHC: 31.4 g/dL (ref 30.0–36.0)
RDW: 18.7 % — ABNORMAL HIGH (ref 11.5–15.5)

## 2012-04-22 LAB — GLUCOSE, CAPILLARY: Glucose-Capillary: 299 mg/dL — ABNORMAL HIGH (ref 70–99)

## 2012-04-22 LAB — HEPATITIS B SURFACE ANTIGEN: Hepatitis B Surface Ag: NEGATIVE

## 2012-04-22 LAB — POCT I-STAT TROPONIN I

## 2012-04-22 MED ORDER — ALTEPLASE 2 MG IJ SOLR
2.0000 mg | Freq: Once | INTRAMUSCULAR | Status: AC | PRN
  Filled 2012-04-22: qty 2

## 2012-04-22 MED ORDER — PANTOPRAZOLE SODIUM 40 MG IV SOLR
40.0000 mg | Freq: Every day | INTRAVENOUS | Status: DC
  Administered 2012-04-22 – 2012-04-24 (×3): 40 mg via INTRAVENOUS
  Filled 2012-04-22 (×4): qty 40

## 2012-04-22 MED ORDER — DIPHENHYDRAMINE HCL 50 MG/ML IJ SOLN: 50.0000 mg | Freq: Once | INTRAMUSCULAR | Status: AC | PRN

## 2012-04-22 MED ORDER — INSULIN ASPART 100 UNIT/ML ~~LOC~~ SOLN: 0.0000 [IU] | SUBCUTANEOUS | Status: DC

## 2012-04-22 MED ORDER — ASPIRIN 81 MG PO CHEW
324.0000 mg | CHEWABLE_TABLET | ORAL | Status: AC
  Administered 2012-04-22: 324 mg via ORAL
  Filled 2012-04-22: qty 4

## 2012-04-22 MED ORDER — METOCLOPRAMIDE HCL 5 MG PO TABS
5.0000 mg | ORAL_TABLET | Freq: Three times a day (TID) | ORAL | Status: DC
  Administered 2012-04-23 – 2012-05-05 (×27): 5 mg via ORAL
  Filled 2012-04-22 (×40): qty 1

## 2012-04-22 MED ORDER — ACETAMINOPHEN 650 MG RE SUPP: 650.0000 mg | Freq: Four times a day (QID) | RECTAL | Status: DC | PRN

## 2012-04-22 MED ORDER — HEPARIN (PORCINE) IN NACL 100-0.45 UNIT/ML-% IJ SOLN: 10.0000 [IU]/kg/h | INTRAMUSCULAR | Status: DC

## 2012-04-22 MED ORDER — CAMPHOR-MENTHOL 0.5-0.5 % EX LOTN
1.0000 "application " | TOPICAL_LOTION | Freq: Three times a day (TID) | CUTANEOUS | Status: DC | PRN
  Filled 2012-04-22: qty 222

## 2012-04-22 MED ORDER — LIDOCAINE HCL (PF) 1 % IJ SOLN: 5.0000 mL | INTRAMUSCULAR | Status: DC | PRN

## 2012-04-22 MED ORDER — HYDROXYZINE HCL 25 MG PO TABS
25.0000 mg | ORAL_TABLET | Freq: Three times a day (TID) | ORAL | Status: DC | PRN
  Filled 2012-04-22: qty 1

## 2012-04-22 MED ORDER — SODIUM CHLORIDE 0.9 % IV SOLN: 100.0000 mL | INTRAVENOUS | Status: DC | PRN

## 2012-04-22 MED ORDER — PENTAFLUOROPROP-TETRAFLUOROETH EX AERO: 1.0000 "application " | INHALATION_SPRAY | CUTANEOUS | Status: DC | PRN

## 2012-04-22 MED ORDER — ZOLPIDEM TARTRATE 5 MG PO TABS
5.0000 mg | ORAL_TABLET | Freq: Every evening | ORAL | Status: DC | PRN
  Administered 2012-04-27 – 2012-05-03 (×3): 5 mg via ORAL
  Filled 2012-04-22 (×3): qty 1

## 2012-04-22 MED ORDER — ONDANSETRON HCL 4 MG/2ML IJ SOLN
4.0000 mg | Freq: Four times a day (QID) | INTRAMUSCULAR | Status: DC | PRN
  Administered 2012-04-24 – 2012-05-03 (×14): 4 mg via INTRAVENOUS
  Filled 2012-04-22 (×11): qty 2

## 2012-04-22 MED ORDER — NEPRO/CARBSTEADY PO LIQD
237.0000 mL | Freq: Three times a day (TID) | ORAL | Status: DC | PRN
  Filled 2012-04-22: qty 237

## 2012-04-22 MED ORDER — VANCOMYCIN HCL 1000 MG IV SOLR
1500.0000 mg | Freq: Once | INTRAVENOUS | Status: AC
  Administered 2012-04-22: 1500 mg via INTRAVENOUS
  Filled 2012-04-22: qty 1500

## 2012-04-22 MED ORDER — OXYCODONE-ACETAMINOPHEN 5-325 MG PO TABS
1.0000 | ORAL_TABLET | Freq: Once | ORAL | Status: AC
  Administered 2012-04-22: 1 via ORAL
  Filled 2012-04-22: qty 1

## 2012-04-22 MED ORDER — METHYLPREDNISOLONE SODIUM SUCC 40 MG IJ SOLR
32.0000 mg | Freq: Once | INTRAMUSCULAR | Status: AC | PRN
  Filled 2012-04-22: qty 0.8

## 2012-04-22 MED ORDER — ACETAMINOPHEN 325 MG PO TABS
ORAL_TABLET | ORAL | Status: AC
  Filled 2012-04-22: qty 2

## 2012-04-22 MED ORDER — SODIUM CHLORIDE 0.9 % IV SOLN
INTRAVENOUS | Status: DC
  Administered 2012-04-22: 15:00:00 via INTRAVENOUS

## 2012-04-22 MED ORDER — ACETAMINOPHEN 325 MG PO TABS
650.0000 mg | ORAL_TABLET | Freq: Four times a day (QID) | ORAL | Status: DC | PRN
  Administered 2012-04-22 – 2012-04-23 (×2): 650 mg via ORAL
  Filled 2012-04-22: qty 2

## 2012-04-22 MED ORDER — METHYLPREDNISOLONE SODIUM SUCC 125 MG IJ SOLR
125.0000 mg | Freq: Once | INTRAMUSCULAR | Status: AC
  Administered 2012-04-22: 125 mg via INTRAVENOUS
  Filled 2012-04-22: qty 2

## 2012-04-22 MED ORDER — HEPARIN SODIUM (PORCINE) 1000 UNIT/ML DIALYSIS
1000.0000 [IU] | INTRAMUSCULAR | Status: DC | PRN
  Filled 2012-04-22: qty 1

## 2012-04-22 MED ORDER — SORBITOL 70 % SOLN
30.0000 mL | Status: DC | PRN
  Filled 2012-04-22: qty 30

## 2012-04-22 MED ORDER — ONDANSETRON HCL 4 MG PO TABS
4.0000 mg | ORAL_TABLET | Freq: Four times a day (QID) | ORAL | Status: DC | PRN
  Administered 2012-04-30 – 2012-05-05 (×10): 4 mg via ORAL
  Filled 2012-04-22 (×10): qty 1

## 2012-04-22 MED ORDER — DOCUSATE SODIUM 283 MG RE ENEM
1.0000 | ENEMA | RECTAL | Status: DC | PRN
  Filled 2012-04-22: qty 1

## 2012-04-22 MED ORDER — METHYLPREDNISOLONE SODIUM SUCC 40 MG IJ SOLR
32.0000 mg | Freq: Once | INTRAMUSCULAR | Status: AC
  Administered 2012-04-23: 32 mg via INTRAVENOUS
  Filled 2012-04-22: qty 0.8

## 2012-04-22 MED ORDER — METHYLPREDNISOLONE SODIUM SUCC 40 MG IJ SOLR: 32.0000 mg | Freq: Once | INTRAMUSCULAR | Status: DC

## 2012-04-22 MED ORDER — NEPRO/CARBSTEADY PO LIQD
237.0000 mL | ORAL | Status: DC | PRN
  Filled 2012-04-22: qty 237

## 2012-04-22 MED ORDER — DIPHENHYDRAMINE HCL 50 MG/ML IJ SOLN
50.0000 mg | Freq: Once | INTRAMUSCULAR | Status: AC
  Administered 2012-04-22: 50 mg via INTRAVENOUS
  Filled 2012-04-22: qty 1

## 2012-04-22 MED ORDER — SODIUM CHLORIDE 0.9 % IV SOLN: 250.0000 mL | INTRAVENOUS | Status: DC | PRN

## 2012-04-22 MED ORDER — HEPARIN (PORCINE) IN NACL 100-0.45 UNIT/ML-% IJ SOLN
1000.0000 [IU]/h | INTRAMUSCULAR | Status: DC
  Administered 2012-04-23 (×2): 1000 [IU]/h via INTRAVENOUS
  Filled 2012-04-22 (×3): qty 250

## 2012-04-22 MED ORDER — PROMETHAZINE HCL 25 MG PO TABS
12.5000 mg | ORAL_TABLET | Freq: Two times a day (BID) | ORAL | Status: DC | PRN
  Administered 2012-04-27: 12.5 mg via ORAL
  Filled 2012-04-22 (×2): qty 1

## 2012-04-22 MED ORDER — SODIUM CHLORIDE 0.9 % IV SOLN: INTRAVENOUS | Status: DC

## 2012-04-22 MED ORDER — ASPIRIN 300 MG RE SUPP: 300.0000 mg | RECTAL | Status: AC

## 2012-04-22 MED ORDER — DEXTROSE 10 % IV SOLN: INTRAVENOUS | Status: DC | PRN

## 2012-04-22 MED ORDER — HEPARIN BOLUS VIA INFUSION
4000.0000 [IU] | Freq: Once | INTRAVENOUS | Status: AC
  Administered 2012-04-23: 4000 [IU] via INTRAVENOUS
  Filled 2012-04-22: qty 4000

## 2012-04-22 MED ORDER — VANCOMYCIN HCL 1000 MG IV SOLR
750.0000 mg | INTRAVENOUS | Status: DC
  Filled 2012-04-22 (×2): qty 750

## 2012-04-22 MED ORDER — LIDOCAINE-PRILOCAINE 2.5-2.5 % EX CREA: 1.0000 "application " | TOPICAL_CREAM | CUTANEOUS | Status: DC | PRN

## 2012-04-22 MED ORDER — HEPARIN SODIUM (PORCINE) 1000 UNIT/ML DIALYSIS
40.0000 [IU]/kg | INTRAMUSCULAR | Status: DC | PRN
  Administered 2012-04-22: 3000 [IU] via INTRAVENOUS_CENTRAL

## 2012-04-22 MED ORDER — CALCIUM CARBONATE 1250 MG/5ML PO SUSP
500.0000 mg | Freq: Four times a day (QID) | ORAL | Status: DC | PRN
  Filled 2012-04-22: qty 5

## 2012-04-22 NOTE — ED Notes (Signed)
Attempted to gain IV access, no success. IV team paged.  

## 2012-04-22 NOTE — Progress Notes (Signed)
ANTICOAGULATION/ANTIBIOTIC CONSULT NOTE - Initial Consult  Pharmacy Consult for heparin / vancomycin Indication: UE clot s/p portacath insertion / possible MRSA buttocks infection   Allergies  Allergen Reactions  . Compazine (Prochlorperazine) Swelling    Per echart records  . Contrast Media (Iodinated Diagnostic Agents) Swelling    Per echart records  . Meperidine Hcl Swelling  . Metformin And Related Other (See Comments)    'just not myself, was told not to take metformin'  . Morphine Swelling  . Nubain (Nalbuphine Hcl) Swelling  . Tobramycin   . Topiramate (Topamax) Swelling    Tongue swelling - per echart records  . Toradol (Ketorolac Tromethamine) Swelling    Patient Measurements:   Heparin Dosing Weight: 83kg 7/13  Vital Signs: Temp: 97.4 F (36.3 C) (08/27 1200) Temp src: Oral (08/27 1200) BP: 147/74 mmHg (08/27 1450) Pulse Rate: 95  (08/27 1450)  Labs:  Basename 04/22/12 1304  HGB 11.1*  HCT 35.3*  PLT 425*  APTT --  LABPROT 14.5  INR 1.11  HEPARINUNFRC --  CREATININE 6.38*  CKTOTAL --  CKMB --  TROPONINI --    The CrCl is unknown because both a height and weight (above a minimum accepted value) are required for this calculation.   Medical History: Past Medical History  Diagnosis Date  . DVT (deep venous thrombosis)     DVT HISTORY  . Diabetes mellitus   . Migraine     HISTORY  . Anxiety and depression   . CAD (coronary artery disease)     Last cath 2009:  LAD stent 80% stenosis. Circumflex 60-70% stenosis AV groove, right coronary artery 50-60% stenosis. She did have cutting balloon angioplasty of the LAD lesion into a diagonal. However, there was restenosis of this and it was managed medically.  . Bipolar 1 disorder   . Hyperlipidemia   . Hyperparathyroidism   . PE (pulmonary embolism)     HISTORY  . Dialysis patient   . Anemia   . Renal failure     dialysis [place] t,th sat  . Nephrotic syndrome   . Peripheral vascular disease   . MRSA  infection   . Anxiety   . Depression   . Gastroparesis   . Complication of anesthesia   . PONV (postoperative nausea and vomiting)     x3 days post anesth. on 12/19/2011  . Myocardial infarction     x3 , last one - 10 yrs. ago  . Hypertension     sees Dr. Antoine Poche - [place], last stress test 11/2011, see result in EPIC, saw Dr. Antoine Poche as well at that time    . Shortness of breath   . Recurrent upper respiratory infection (URI)     Bronchitis 11/2011 - saw Dr. Loney Hering, treating currently /w antibiotic   . GERD (gastroesophageal reflux disease)   . Pneumonia     APH- 2012  . Noncompliance of patient with renal dialysis     Assessment: 41yof with Hx CAD, ESRD - missed last 2 HD sessions, presented with increased facial swelling, UE swelling, diffuse rash over face and buttocks wounds.  She hs Hx DVT unk when and no outpt AC.   Plan is to dialyze her today so will defer vancomycin start till after HD. Anticoagulation initiated for possible UE thrombus.  CBC stable, INR 1.1.   Goal of Therapy:  Heparin level 0.3-0.7 units/ml Monitor platelets by anticoagulation protocol: Yes Vancomycin Pre-HD level  = 25 Plan:  Heparin bolus 4000 uts IV x1  Heparin drip 1000 uts/hr  HL and CBC 6hr after start and daily Vancomycin 1500mg  IV at the end of HD then 750mg  qHD -TTS  Marcelino Scot 04/22/2012,4:11 PM

## 2012-04-22 NOTE — Consult Note (Signed)
[facility] [place] HISTORY AND PHYSICAL   History of Present Illness:  Patient is a 41 y.o. year old female who presents for evaluation of head and neck swelling.  This has been slowly progressive over the last 36-48 hrs.  She has also had swelling of the upper extremities.  She had angioplasty of her SVC and exchange of a dialysis catheter by Dr Edilia Bo in June. She missed dialysis today because she felt bad.  Her family also has noticed she has had muffling of her voice over the last 24 hrs.  She also complains of some dyspnea and chest pain.  Other medical problems include severe peripheral arterial disease with aortobifem and right fem AK pop by Dr Myra Gianotti in May.  She has had chronic leg swelling and pain since then.  Multiple other medical problems stable as listed below.  The  aortobifem fem pop procedure was done for wounds on her feet and legs which are slowly healing.  Past Medical History  Diagnosis Date  . DVT (deep venous thrombosis)     DVT HISTORY  . Diabetes mellitus   . Migraine     HISTORY  . Anxiety and depression   . CAD (coronary artery disease)     Last cath 2009:  LAD stent 80% stenosis. Circumflex 60-70% stenosis AV groove, right coronary artery 50-60% stenosis. She did have cutting balloon angioplasty of the LAD lesion into a diagonal. However, there was restenosis of this and it was managed medically.  . Bipolar 1 disorder   . Hyperlipidemia   . Hyperparathyroidism   . PE (pulmonary embolism)     HISTORY  . Dialysis patient   . Anemia   . Renal failure     dialysis [place] t,th sat  . Nephrotic syndrome   . Peripheral vascular disease   . MRSA infection   . Anxiety   . Depression   . Gastroparesis   . Complication of anesthesia   . PONV (postoperative nausea and vomiting)     x3 days post anesth. on 12/19/2011  . Myocardial infarction     x3 , last one - 10 yrs. ago  . Hypertension     sees Dr. Antoine Poche - [place], last stress test  11/2011, see result in EPIC, saw Dr. Antoine Poche as well at that time    . Shortness of breath   . Recurrent upper respiratory infection (URI)     Bronchitis 11/2011 - saw Dr. Loney Hering, treating currently /w antibiotic   . GERD (gastroesophageal reflux disease)   . Pneumonia     APH- 2012  . Noncompliance of patient with renal dialysis     Past Surgical History  Procedure Date  . Incise and drain abcess     OF THIGHS FROM INSULIN INJECTIONS  . Heart stents   . Portacath placement 2003  . Cataract extraction w/phaco 11/09/2011    Procedure: CATARACT EXTRACTION PHACO AND INTRAOCULAR LENS PLACEMENT (IOC);  Surgeon: Edmon Crape, MD;  Location: [facility] OR;  Service: Ophthalmology;  Laterality: Right;  . Pars plana vitrectomy 11/09/2011    Procedure: PARS PLANA VITRECTOMY WITH 23 GAUGE;  Surgeon: Edmon Crape, MD;  Location: [facility] OR;  Service: Ophthalmology;  Laterality: Right;  Ahmed Valve; Scleral Reinforcement Graft  . Dg av dialysis  shunt access exist*l* or 12/13/11    left arm  . Dialysis cath inserted & portacath      dialysis catheter inserted & portacath d/c'd- 12/19/2011  . Back surgery  X 4    x4 times   . Cholecystectomy   . Eye surgery   . Cardiac catheterization   . Aorta - bilateral femoral artery bypass graft 12/28/2011    Procedure: AORTA BIFEMORAL BYPASS GRAFT;  Surgeon: Nada Libman, MD;  Location: [facility] OR;  Service: Vascular;  Laterality: N/A;  AortoBifemoral Bypass using hemasheild 14x7 graft  . Femoral-popliteal bypass graft 12/28/2011    Procedure: BYPASS GRAFT FEMORAL-POPLITEAL ARTERY;  Surgeon: Nada Libman, MD;  Location: [facility] OR;  Service: Vascular;  Laterality: Right;  right femoral popliteal bypass using 6x80 propaten graft  . Insertion of dialysis catheter 02/15/2012    Procedure: INSERTION OF DIALYSIS CATHETER;  Surgeon: Chuck Hint, MD;  Location: [facility] OR;  Service: Vascular;  Laterality: Right;  insertion of dialysis catheter right internal jugular vein,removal of  left internal jugular vein dialysis catheter  . Insertion of dialysis catheter 02/21/2012    Procedure: INSERTION OF DIALYSIS CATHETER;  Surgeon: Chuck Hint, MD;  Location: [facility] OR;  Service: Cardiovascular;  Laterality: N/A;  Insertion new dialysis catheter; possible venoplasty of fibrin sheath of  Right IJ catheter  . Venogram 02/21/2012    Procedure: VENOGRAM;  Surgeon: Chuck Hint, MD;  Location: [facility] OR;  Service: Cardiovascular;  Laterality: N/A;  possible venoplasty     Social History History  Substance Use Topics  . Smoking status: Current Everyday Smoker -- 1.0 packs/day for 20 years    Types: Cigarettes  . Smokeless tobacco: Never Used   Comment: pt states she is not ready to quit   . Alcohol Use: No    Family History Family History  Problem Relation Age of Onset  . Transient ischemic attack Mother   . Hepatitis Sister   . Diabetes type II Other   . Anesthesia problems Neg Hx   . Hypotension Neg Hx   . Malignant hyperthermia Neg Hx   . Pseudochol deficiency Neg Hx     Allergies  Allergies  Allergen Reactions  . Compazine (Prochlorperazine) Swelling    Per echart records  . Contrast Media (Iodinated Diagnostic Agents) Swelling    Per echart records  . Meperidine Hcl Swelling  . Metformin And Related Other (See Comments)    'just not myself, was told not to take metformin'  . Morphine Swelling  . Nubain (Nalbuphine Hcl) Swelling  . Tobramycin   . Topiramate (Topamax) Swelling    Tongue swelling - per echart records  . Toradol (Ketorolac Tromethamine) Swelling     Current Facility-Administered Medications  Medication Dose Route Frequency Provider Last Rate Last Dose  . 0.9 %  sodium chloride infusion   Intravenous Continuous Cheri Guppy, MD 125 mL/hr at 04/22/12 1433    . 0.9 %  sodium chloride infusion  250 mL Intravenous PRN Jeanella Craze, NP      . 0.9 %  sodium chloride infusion   Intravenous Continuous Jeanella Craze, NP      .  aspirin chewable tablet 324 mg  324 mg Oral NOW Jeanella Craze, NP       Or  . aspirin suppository 300 mg  300 mg Rectal NOW Jeanella Craze, NP      . diphenhydrAMINE (BENADRYL) injection 50 mg  50 mg Intravenous Once Cheri Guppy, MD   50 mg at 04/22/12 1433  . heparin ADULT infusion 100 units/mL (25000 units/250 mL)  10 Units/kg/hr (Adjusted) Intravenous Continuous Cheri Guppy, MD      . methylPREDNISolone sodium  succinate (SOLU-MEDROL) 125 mg/2 mL injection 125 mg  125 mg Intravenous Once Cheri Guppy, MD   125 mg at 04/22/12 1434  . pantoprazole (PROTONIX) injection 40 mg  40 mg Intravenous QHS Jeanella Craze, NP       Current Outpatient Prescriptions  Medication Sig Dispense Refill  . albuterol (PROVENTIL HFA;VENTOLIN HFA) 108 (90 BASE) MCG/ACT inhaler Inhale 2 puffs into the lungs every 6 (six) hours as needed. Shortness of breath      . ALPRAZolam (XANAX) 1 MG tablet Take 1 mg by mouth 3 (three) times daily as needed. For anxiety      . ARIPiprazole (ABILIFY) 2 MG tablet Take 10 mg by mouth daily.       . Aspirin-Salicylamide-Caffeine (BC HEADACHE POWDER PO) Take 0.5 packets by mouth daily after breakfast. headache      . b complex-vitamin c-folic acid (NEPHRO-VITE) 0.8 MG TABS Take 0.8 mg by mouth at bedtime.      Marland Kitchen BACITRACIN OP Place 1 drop into both eyes 6 (six) times daily.      . butalbital-acetaminophen-caffeine (FIORICET, ESGIC) 50-325-40 MG per tablet Take 1 tablet by mouth 3 (three) times daily after meals. For headaches      . cinacalcet (SENSIPAR) 30 MG tablet Take 30 mg by mouth daily with supper.       . clopidogrel (PLAVIX) 75 MG tablet Take 75 mg by mouth daily. Last dose 12/17/2011      . darbepoetin (ARANESP) 60 MCG/0.3ML SOLN Inject 60 mcg into the vein every Saturday with hemodialysis.      Marland Kitchen insulin glargine (LANTUS) 100 UNIT/ML injection Inject 33 Units into the skin at bedtime.       . insulin lispro (HUMALOG) 100 UNIT/ML injection Inject 7-15 Units  into the skin 4 (four) times daily -  before meals and at bedtime. TAKING ON SLIDING SCALE 200-249=7 units 250-299=10 units 300-349=12 units >350=15 units      . ofloxacin (OCUFLOX) 0.3 % ophthalmic solution Place 1 drop into both eyes 4 (four) times daily.       Marland Kitchen omeprazole (PRILOSEC) 40 MG capsule Take 40 mg by mouth daily.       Marland Kitchen oxyCODONE-acetaminophen (PERCOCET) 10-325 MG per tablet Take 0.5 tablets by mouth every 4 (four) hours as needed. For pain      . PARoxetine (PAXIL) 40 MG tablet Take 80 mg by mouth every morning.       . promethazine (PHENERGAN) 25 MG tablet Take 1 tablet (25 mg total) by mouth 2 (two) times daily as needed for nausea.  45 tablet  0  . pyridoxine (B-6) 100 MG tablet Take 100 mg by mouth daily.      . sevelamer (RENVELA) 800 MG tablet Take 1,600-4,000 mg by mouth 5 (five) times daily. *Take 5 tablets daily with each meal and take 2 tablets with snacks**      . Eyelid Cleansers (SYSTANE LID WIPES) PADS Place 1 application into the right eye daily.      Marland Kitchen SANTYL ointment APPLY DAILY  30 g  1    ROS:   General:  No weight loss, Fever, chills  HEENT: + headaches, no nasal bleeding, no visual changes, no sore throat  Neurologic: No dizziness, blackouts, seizures. No recent symptoms of stroke or mini- stroke. No recent episodes of slurred speech, or temporary blindness.  Cardiac: + episodes of chest pain/pressure, + shortness of breath at rest.  + shortness of breath with exertion.  Denies history  of atrial fibrillation or irregular heartbeat  Vascular: No history of rest pain in feet.  No history of claudication.  No history of non-healing ulcer, No history of DVT   Pulmonary: No home oxygen, no productive cough, no hemoptysis,  No asthma or wheezing  Musculoskeletal:  [ ]  Arthritis, Arly.Keller ] Low back pain,  [ X] Joint pain  Hematologic:No history of hypercoagulable state.  No history of easy bleeding.  No history of anemia  Gastrointestinal: No hematochezia or  melena,  No gastroesophageal reflux, + trouble swallowing  Urinary: Arly.Keller ] chronic Kidney disease, [ ]  on HD - [ ]  MWF or Arly.Keller ] TTHS, [ ]  Burning with urination, [ ]  Frequent urination, [ ]  Difficulty urinating;   Skin: No rashes  Psychological: + history of anxiety,  + history of depression   Physical Examination  Filed Vitals:   04/22/12 1200 04/22/12 1311 04/22/12 1401 04/22/12 1450  BP: 138/92  147/74 147/74  Pulse:   95 95  Temp: 97.4 F (36.3 C)     TempSrc: Oral     Resp: 95  19 17  SpO2: 100% 99% 100% 100%    There is no height or weight on file to calculate BMI.  General:  Alert and oriented, mild acute distress, sleepy from recent benadryl dose HEENT: diffuse neck swelling Neck: No bruit or JVD Pulmonary: Clear to auscultation bilaterally Cardiac: Regular Rate and Rhythm without murmur Abdomen: Soft, non-tender, non-distended Skin: No rash, early skin blisters forearm Extremity Pulses:  2+ radial, brachial, femoral, absent dorsalis pedis, posterior tibial pulses bilaterally Musculoskeletal: diffuse edema both upper extremities milder in lower extremities  Neurologic: Upper and lower extremity motor 5/5 and symmetric   ASSESSMENT: Possible narrowing of SVC from dialysis catheter   PLAN:  Would benefit from dialysis today to help with peripheral edema              Premedicate today for contrast allergy              Central venogram possible PTA/stent of SVC tomorrow in operating room  Appreciate Renal/ Critical care assistance             NPO post midnight, consent  Fabienne Bruns, MD [facility] [place] Office: [phone] Pager: [phone]

## 2012-04-22 NOTE — ED Notes (Signed)
Pt reports she did not go to dialysis on Saturday.

## 2012-04-22 NOTE — Consult Note (Signed)
Reason for Consult: Continuity of end-stage renal disease care Referring Physician: Dr. Cyril Mourning- critical care medicine  [name] is an 41 y.o. female.  HPI:  41 year old Caucasian woman with past medical history significant for multiple issues with difficult vascular access. Presents with progressive worsening of facial swelling and bilateral lower and upper extremity edema. States that facial swelling was bad enough that she did not go to dialysis on Saturday of last week or Tuesday of this week after her last hemodialysis Thursday last week. Also complains of shortness of breath worse with minimal exertion. In the ER, noted to have features consistent with SVC syndrome and has been evaluated by vascular surgery with proposed intervention tomorrow.  From previous the available data-she dialyzes at the [facility] unit. Dialysis Prescription: EDW 80Kg, 4h , 2K/2.5Ca, QB 350/ QD 600, Right IJ CVL.  Epogen 8800u TIW, Venofer 100 TIW   Past Medical History  Diagnosis Date  . DVT (deep venous thrombosis)     DVT HISTORY  . Diabetes mellitus   . Migraine     HISTORY  . Anxiety and depression   . CAD (coronary artery disease)     Last cath 2009:  LAD stent 80% stenosis. Circumflex 60-70% stenosis AV groove, right coronary artery 50-60% stenosis. She did have cutting balloon angioplasty of the LAD lesion into a diagonal. However, there was restenosis of this and it was managed medically.  . Bipolar 1 disorder   . Hyperlipidemia   . Hyperparathyroidism   . PE (pulmonary embolism)     HISTORY  . Dialysis patient   . Anemia   . Renal failure     dialysis [place] t,th sat  . Nephrotic syndrome   . Peripheral vascular disease   . MRSA infection   . Anxiety   . Depression   . Gastroparesis   . Complication of anesthesia   . PONV (postoperative nausea and vomiting)     x3 days post anesth. on 12/19/2011  . Myocardial infarction     x3 , last one - 10 yrs. ago  . Hypertension      sees Dr. Antoine Poche - [place], last stress test 11/2011, see result in EPIC, saw Dr. Antoine Poche as well at that time    . Shortness of breath   . Recurrent upper respiratory infection (URI)     Bronchitis 11/2011 - saw Dr. Loney Hering, treating currently /w antibiotic   . GERD (gastroesophageal reflux disease)   . Pneumonia     APH- 2012  . Noncompliance of patient with renal dialysis     Past Surgical History  Procedure Date  . Incise and drain abcess     OF THIGHS FROM INSULIN INJECTIONS  . Heart stents   . Portacath placement 2003  . Cataract extraction w/phaco 11/09/2011    Procedure: CATARACT EXTRACTION PHACO AND INTRAOCULAR LENS PLACEMENT (IOC);  Surgeon: Edmon Crape, MD;  Location: [facility] OR;  Service: Ophthalmology;  Laterality: Right;  . Pars plana vitrectomy 11/09/2011    Procedure: PARS PLANA VITRECTOMY WITH 23 GAUGE;  Surgeon: Edmon Crape, MD;  Location: [facility] OR;  Service: Ophthalmology;  Laterality: Right;  Ahmed Valve; Scleral Reinforcement Graft  . Dg av dialysis  shunt access exist*l* or 12/13/11    left arm  . Dialysis cath inserted & portacath      dialysis catheter inserted & portacath d/c'd- 12/19/2011  . Back surgery X 4    x4 times   . Cholecystectomy   .  Eye surgery   . Cardiac catheterization   . Aorta - bilateral femoral artery bypass graft 12/28/2011    Procedure: AORTA BIFEMORAL BYPASS GRAFT;  Surgeon: Nada Libman, MD;  Location: [facility] OR;  Service: Vascular;  Laterality: N/A;  AortoBifemoral Bypass using hemasheild 14x7 graft  . Femoral-popliteal bypass graft 12/28/2011    Procedure: BYPASS GRAFT FEMORAL-POPLITEAL ARTERY;  Surgeon: Nada Libman, MD;  Location: [facility] OR;  Service: Vascular;  Laterality: Right;  right femoral popliteal bypass using 6x80 propaten graft  . Insertion of dialysis catheter 02/15/2012    Procedure: INSERTION OF DIALYSIS CATHETER;  Surgeon: Chuck Hint, MD;  Location: [facility] OR;  Service: Vascular;  Laterality: Right;  insertion of dialysis  catheter right internal jugular vein,removal of left internal jugular vein dialysis catheter  . Insertion of dialysis catheter 02/21/2012    Procedure: INSERTION OF DIALYSIS CATHETER;  Surgeon: Chuck Hint, MD;  Location: [facility] OR;  Service: Cardiovascular;  Laterality: N/A;  Insertion new dialysis catheter; possible venoplasty of fibrin sheath of  Right IJ catheter  . Venogram 02/21/2012    Procedure: VENOGRAM;  Surgeon: Chuck Hint, MD;  Location: [facility] OR;  Service: Cardiovascular;  Laterality: N/A;  possible venoplasty    Family History  Problem Relation Age of Onset  . Transient ischemic attack Mother   . Hepatitis Sister   . Diabetes type II Other   . Anesthesia problems Neg Hx   . Hypotension Neg Hx   . Malignant hyperthermia Neg Hx   . Pseudochol deficiency Neg Hx     Social History:  reports that she has been smoking Cigarettes.  She has a 20 pack-year smoking history. She has never used smokeless tobacco. She reports that she does not drink alcohol or use illicit drugs.  Allergies:  Allergies  Allergen Reactions  . Compazine (Prochlorperazine) Swelling    Per echart records  . Contrast Media (Iodinated Diagnostic Agents) Swelling    Per echart records  . Meperidine Hcl Swelling  . Metformin And Related Other (See Comments)    'just not myself, was told not to take metformin'  . Morphine Swelling  . Nubain (Nalbuphine Hcl) Swelling  . Tobramycin   . Topiramate (Topamax) Swelling    Tongue swelling - per echart records  . Toradol (Ketorolac Tromethamine) Swelling    Medications:  Scheduled:   . aspirin  324 mg Oral NOW   Or  . aspirin  300 mg Rectal NOW  . diphenhydrAMINE  50 mg Intravenous Once  . heparin  4,000 Units Intravenous Once  . insulin aspart  0-7 Units Subcutaneous Q4H  . methylPREDNISolone (SOLU-MEDROL) injection  125 mg Intravenous Once  . methylPREDNISolone (SOLU-MEDROL) injection  32 mg Intravenous Once  . methylPREDNISolone  (SOLU-MEDROL) injection  32 mg Intravenous Once  . metoCLOPramide  5 mg Oral TID AC  . pantoprazole (PROTONIX) IV  40 mg Intravenous QHS  . vancomycin  1,500 mg Intravenous Once  . vancomycin  750 mg Intravenous Q T,Th,Sa-HD    Results for orders placed during the hospital encounter of 04/22/12 (from the past 48 hour(s))  CBC     Status: Abnormal   Collection Time   04/22/12  1:04 PM      Component Value Range Comment   WBC 11.9 (*) 4.0 - 10.5 K/uL    RBC 3.55 (*) 3.87 - 5.11 MIL/uL    Hemoglobin 11.1 (*) 12.0 - 15.0 g/dL    HCT 04.5 (*) 40.9 - 46.0 %  MCV 99.4  78.0 - 100.0 fL    MCH 31.3  26.0 - 34.0 pg    MCHC 31.4  30.0 - 36.0 g/dL    RDW 16.1 (*) 09.6 - 15.5 %    Platelets 425 (*) 150 - 400 K/uL   BASIC METABOLIC PANEL     Status: Abnormal   Collection Time   04/22/12  1:04 PM      Component Value Range Comment   Sodium 130 (*) 135 - 145 mEq/L    Potassium 5.7 (*) 3.5 - 5.1 mEq/L    Chloride 91 (*) 96 - 112 mEq/L    CO2 14 (*) 19 - 32 mEq/L    Glucose, Bld 324 (*) 70 - 99 mg/dL    BUN 84 (*) 6 - 23 mg/dL    Creatinine, Ser 0.45 (*) 0.50 - 1.10 mg/dL    Calcium 9.7  8.4 - 40.9 mg/dL    GFR calc non Af Amer 7 (*) >90 mL/min    GFR calc Af Amer 8 (*) >90 mL/min   PROTIME-INR     Status: Normal   Collection Time   04/22/12  1:04 PM      Component Value Range Comment   Prothrombin Time 14.5  11.6 - 15.2 seconds    INR 1.11  0.00 - 1.49   D-DIMER, QUANTITATIVE     Status: Abnormal   Collection Time   04/22/12  1:04 PM      Component Value Range Comment   D-Dimer, Quant 2.82 (*) 0.00 - 0.48 ug/mL-FEU   POCT I-STAT TROPONIN I     Status: Normal   Collection Time   04/22/12  1:23 PM      Component Value Range Comment   Troponin i, poc 0.01  0.00 - 0.08 ng/mL    Comment 3            GLUCOSE, CAPILLARY     Status: Abnormal   Collection Time   04/22/12  4:49 PM      Component Value Range Comment   Glucose-Capillary 258 (*) 70 - 99 mg/dL     Dg Chest 2 View  04/06/9146   *RADIOLOGY REPORT*  Clinical Data: Chest pain and shortness of breath.  CHEST - 2 VIEW  Comparison: Chest x-ray 03/07/2012.  Findings: Lung volumes are low.  No consolidative airspace disease. Small right pleural effusion.  Pulmonary vasculature is normal. Heart size is upper limits of normal.  Mediastinal contours are unremarkable.  Right-sided internal jugular PermCath with tips terminating in the right atrium.  Atherosclerosis in the thoracic aorta.  IMPRESSION: 1.  Small right-sided pleural effusion. 2.  Atherosclerosis.   Original Report Authenticated By: Florencia Reasons, M.D.     Review of Systems  Constitutional: Positive for malaise/fatigue. Negative for fever, chills, weight loss and diaphoresis.  HENT: Positive for congestion, sore throat and neck pain. Negative for hearing loss, ear pain, nosebleeds, tinnitus and ear discharge.   Eyes: Positive for blurred vision and redness. Negative for double vision, photophobia, pain and discharge.  Respiratory: Positive for cough, shortness of breath and wheezing. Negative for hemoptysis, sputum production and stridor.   Cardiovascular: Positive for orthopnea, leg swelling and PND. Negative for chest pain, palpitations and claudication.  Gastrointestinal: Positive for nausea. Negative for heartburn, vomiting, abdominal pain, diarrhea, constipation, blood in stool and melena.  Genitourinary: Negative.   Musculoskeletal: Positive for myalgias, back pain and joint pain. Negative for falls.  Skin: Positive for itching. Negative for rash.  Neurological: Positive for dizziness, speech change, weakness and headaches. Negative for tingling, tremors, sensory change, focal weakness, seizures and loss of consciousness.  Endo/Heme/Allergies: Negative.   Psychiatric/Behavioral: The patient is nervous/anxious.   All other systems reviewed and are negative.   Blood pressure 147/74, pulse 95, temperature 97.4 F (36.3 C), temperature source Oral, resp. rate  17, last menstrual period 09/03/2008, SpO2 100.00%. Physical Exam  Nursing note and vitals reviewed. Constitutional: She is oriented to person, place, and time. She appears well-developed and well-nourished. She appears distressed.  HENT:  Mouth/Throat: No oropharyngeal exudate.       Significant edema of face noted with periorbital edema/ congestion  Neck: JVD present. No tracheal deviation present. No thyromegaly present.       Asymmetric bull-neck noted with right IJ TC  Cardiovascular: Normal rate and normal heart sounds.  Exam reveals no gallop and no friction rub.   No murmur heard. Respiratory: Effort normal. Stridor present. No respiratory distress. She has wheezes. She has rales. She exhibits no tenderness.  GI: Soft. Bowel sounds are normal. She exhibits no distension and no mass. There is no tenderness. There is no rebound and no guarding.  Musculoskeletal: She exhibits edema.       2+ symmetric LE and UE edema  Lymphadenopathy:    She has no cervical adenopathy.  Neurological: She is alert and oriented to person, place, and time. No cranial nerve deficit. Coordination normal.  Skin: Skin is warm and dry. No rash noted. She is not diaphoretic. There is erythema. No pallor.  Psychiatric: She has a normal mood and affect. Her behavior is normal.    Assessment/Plan: 1. End stage renal disease/volume overload: Plan on urgent hemodialysis today to address her volume status/respiratory difficulties, we'll reevaluate for need for hemodialysis again tomorrow. Anticipate that her difficult access may pose a challenge to satisfactory blood flow/adequate dialysis. 2. Superior vena cava syndrome: Plans noted by vascular surgery for intervention tomorrow-likely stent to SVC 3. Hyperkalemia: Secondary to missed hemodialysis, plan for hemodialysis today to rectify this abnormality. 4. Anemia of chronic kidney disease: Hemoglobin currently at goal, we'll restart ESA when hemoglobin starts  trending down.  Li Bobo K. 04/22/2012, 4:51 PM

## 2012-04-22 NOTE — ED Notes (Signed)
Pt c/o face swelling, cough and rash throughout body X 2 days. Pt c/o CP and SOB since this morning. Pt reports she has noticed a bleeding ulcer on her bottom X 4 days. Pt is a dialysis pt T/TH/S, has not gone today. Pt has a dialysis cath in her right upper chest.

## 2012-04-22 NOTE — H&P (Signed)
Name: [name] MRN: [id number] DOB: 1971-06-13    LOS: 0  PCCM ADMISSION NOTE  History of Present Illness: 29 /F ESRD on HD tue/thu/sat, difficult access, last HD 8/22 , recent aortobifemoral bypass surgery ([facility]) presented 8/27 with gradually increasing swelling of face & BUEs, erythematous papular rash over upper extremities &  ears, gen weakness & worse wounds over buttocks. Labs show hyperkalemia. PCCM admitting since medically complex H/o contrast allergy Other PMH : DM-2, bipolar, prior MRSA   Per sister, LE & groin wounds have improved after bypass but sacral decubs have worsened & she has new rash , no fevers but feeling unwell to the point of missing HD x 2  Lines / Drains: RIJ permacath  7/22 exchanged 8/2 with SVC angioplasty (IR)  Cultures: bld cx 8/27 >>  Antibiotics: Vanc 8/27 >>  Tests / Events:   The patient is drowsy and unable to provide history, which was obtained from sister available medical records.    Past Medical History  Diagnosis Date  . DVT (deep venous thrombosis)     DVT HISTORY  . Diabetes mellitus   . Migraine     HISTORY  . Anxiety and depression   . CAD (coronary artery disease)     Last cath 2009:  LAD stent 80% stenosis. Circumflex 60-70% stenosis AV groove, right coronary artery 50-60% stenosis. She did have cutting balloon angioplasty of the LAD lesion into a diagonal. However, there was restenosis of this and it was managed medically.  . Bipolar 1 disorder   . Hyperlipidemia   . Hyperparathyroidism   . PE (pulmonary embolism)     HISTORY  . Dialysis patient   . Anemia   . Renal failure     dialysis [place] t,th sat  . Nephrotic syndrome   . Peripheral vascular disease   . MRSA infection   . Anxiety   . Depression   . Gastroparesis   . Complication of anesthesia   . PONV (postoperative nausea and vomiting)     x3 days post anesth. on 12/19/2011  . Myocardial infarction     x3 , last one - 10 yrs. ago  . Hypertension       sees Dr. Antoine Poche - [place], last stress test 11/2011, see result in EPIC, saw Dr. Antoine Poche as well at that time    . Shortness of breath   . Recurrent upper respiratory infection (URI)     Bronchitis 11/2011 - saw Dr. Loney Hering, treating currently /w antibiotic   . GERD (gastroesophageal reflux disease)   . Pneumonia     [facility]- 2012  . Noncompliance of patient with renal dialysis    Past Surgical History  Procedure Date  . Incise and drain abcess     OF THIGHS FROM INSULIN INJECTIONS  . Heart stents   . Portacath placement 2003  . Cataract extraction w/phaco 11/09/2011    Procedure: CATARACT EXTRACTION PHACO AND INTRAOCULAR LENS PLACEMENT (IOC);  Surgeon: Edmon Crape, MD;  Location: [facility] OR;  Service: Ophthalmology;  Laterality: Right;  . Pars plana vitrectomy 11/09/2011    Procedure: PARS PLANA VITRECTOMY WITH 23 GAUGE;  Surgeon: Edmon Crape, MD;  Location: [facility] OR;  Service: Ophthalmology;  Laterality: Right;  Ahmed Valve; Scleral Reinforcement Graft  . Dg av dialysis  shunt access exist*l* or 12/13/11    left arm  . Dialysis cath inserted & portacath      dialysis catheter inserted & portacath d/c'd- 12/19/2011  . Back surgery  X 4    x4 times   . Cholecystectomy   . Eye surgery   . Cardiac catheterization   . Aorta - bilateral femoral artery bypass graft 12/28/2011    Procedure: AORTA BIFEMORAL BYPASS GRAFT;  Surgeon: Nada Libman, MD;  Location: [facility] OR;  Service: Vascular;  Laterality: N/A;  AortoBifemoral Bypass using hemasheild 14x7 graft  . Femoral-popliteal bypass graft 12/28/2011    Procedure: BYPASS GRAFT FEMORAL-POPLITEAL ARTERY;  Surgeon: Nada Libman, MD;  Location: [facility] OR;  Service: Vascular;  Laterality: Right;  right femoral popliteal bypass using 6x80 propaten graft  . Insertion of dialysis catheter 02/15/2012    Procedure: INSERTION OF DIALYSIS CATHETER;  Surgeon: Chuck Hint, MD;  Location: [facility] OR;  Service: Vascular;  Laterality: Right;  insertion of dialysis  catheter right internal jugular vein,removal of left internal jugular vein dialysis catheter  . Insertion of dialysis catheter 02/21/2012    Procedure: INSERTION OF DIALYSIS CATHETER;  Surgeon: Chuck Hint, MD;  Location: [facility] OR;  Service: Cardiovascular;  Laterality: N/A;  Insertion new dialysis catheter; possible venoplasty of fibrin sheath of  Right IJ catheter  . Venogram 02/21/2012    Procedure: VENOGRAM;  Surgeon: Chuck Hint, MD;  Location: [facility] OR;  Service: Cardiovascular;  Laterality: N/A;  possible venoplasty   Prior to Admission medications   Medication Sig Start Date End Date Taking? Authorizing Provider  albuterol (PROVENTIL HFA;VENTOLIN HFA) 108 (90 BASE) MCG/ACT inhaler Inhale 2 puffs into the lungs every 6 (six) hours as needed. Shortness of breath   Yes Historical Provider, MD  ALPRAZolam (XANAX) 1 MG tablet Take 1 mg by mouth 3 (three) times daily as needed. For anxiety   Yes Historical Provider, MD  ARIPiprazole (ABILIFY) 2 MG tablet Take 10 mg by mouth daily.    Yes Historical Provider, MD  Aspirin-Salicylamide-Caffeine (BC HEADACHE POWDER PO) Take 0.5 packets by mouth daily after breakfast. headache   Yes Historical Provider, MD  b complex-vitamin c-folic acid (NEPHRO-VITE) 0.8 MG TABS Take 0.8 mg by mouth at bedtime.   Yes Historical Provider, MD  BACITRACIN OP Place 1 drop into both eyes 6 (six) times daily.   Yes Historical Provider, MD  butalbital-acetaminophen-caffeine (FIORICET, ESGIC) 50-325-40 MG per tablet Take 1 tablet by mouth 3 (three) times daily after meals. For headaches   Yes Historical Provider, MD  cinacalcet (SENSIPAR) 30 MG tablet Take 30 mg by mouth daily with supper.    Yes Historical Provider, MD  clopidogrel (PLAVIX) 75 MG tablet Take 75 mg by mouth daily. Last dose 12/17/2011   Yes Historical Provider, MD  darbepoetin (ARANESP) 60 MCG/0.3ML SOLN Inject 60 mcg into the vein every Saturday with hemodialysis. 02/23/12  Yes Lonia Blood,  MD  insulin glargine (LANTUS) 100 UNIT/ML injection Inject 33 Units into the skin at bedtime.    Yes Historical Provider, MD  insulin lispro (HUMALOG) 100 UNIT/ML injection Inject 7-15 Units into the skin 4 (four) times daily -  before meals and at bedtime. TAKING ON SLIDING SCALE 200-249=7 units 250-299=10 units 300-349=12 units >350=15 units   Yes Historical Provider, MD  ofloxacin (OCUFLOX) 0.3 % ophthalmic solution Place 1 drop into both eyes 4 (four) times daily.    Yes Historical Provider, MD  omeprazole (PRILOSEC) 40 MG capsule Take 40 mg by mouth daily.  11/12/11  Yes Malissa Hippo, MD  oxyCODONE-acetaminophen (PERCOCET) 10-325 MG per tablet Take 0.5 tablets by mouth every 4 (four) hours as needed. For pain  Yes Historical Provider, MD  PARoxetine (PAXIL) 40 MG tablet Take 80 mg by mouth every morning.    Yes Historical Provider, MD  promethazine (PHENERGAN) 25 MG tablet Take 1 tablet (25 mg total) by mouth 2 (two) times daily as needed for nausea. 04/14/12  Yes Malissa Hippo, MD  pyridoxine (B-6) 100 MG tablet Take 100 mg by mouth daily.   Yes Historical Provider, MD  sevelamer (RENVELA) 800 MG tablet Take 1,600-4,000 mg by mouth 5 (five) times daily. *Take 5 tablets daily with each meal and take 2 tablets with snacks**   Yes Historical Provider, MD  Eyelid Cleansers (SYSTANE LID WIPES) PADS Place 1 application into the right eye daily.    Historical Provider, MD  SANTYL ointment APPLY DAILY 02/25/12   Fransisco Hertz, MD   Allergies Allergies  Allergen Reactions  . Compazine (Prochlorperazine) Swelling    Per echart records  . Contrast Media (Iodinated Diagnostic Agents) Swelling    Per echart records  . Meperidine Hcl Swelling  . Metformin And Related Other (See Comments)    'just not myself, was told not to take metformin'  . Morphine Swelling  . Nubain (Nalbuphine Hcl) Swelling  . Tobramycin   . Topiramate (Topamax) Swelling    Tongue swelling - per echart records  .  Toradol (Ketorolac Tromethamine) Swelling    Family History Family History  Problem Relation Age of Onset  . Transient ischemic attack Mother   . Hepatitis Sister   . Diabetes type II Other   . Anesthesia problems Neg Hx   . Hypotension Neg Hx   . Malignant hyperthermia Neg Hx   . Pseudochol deficiency Neg Hx     Social History  reports that she has been smoking Cigarettes.  She has a 20 pack-year smoking history. She has never used smokeless tobacco. She reports that she does not drink alcohol or use illicit drugs.  Review Of Systems   Constitutional: negative for anorexia, fevers and sweats POS for weakness Eyes: negative for irritation, redness and visual disturbance  Ears, nose, mouth, throat, and face: negative for earaches, epistaxis, nasal congestion and sore throat  Respiratory: negative for cough, dyspnea on exertion, sputum and wheezing  Cardiovascular: negative for chest pain, dyspnea, lower extremity edema, orthopnea, palpitations and syncope  Gastrointestinal: negative for abdominal pain, constipation, diarrhea, melena, nausea and vomiting  Genitourinary:negative for dysuria, frequency and hematuria  Hematologic/lymphatic: negative for bleeding, easy bruising and lymphadenopathy  Musculoskeletal:negative for arthralgias, muscle weakness and stiff joints  Neurological: negative for coordination problems, gait problems, headaches and weakness  Endocrine: negative for diabetic symptoms including polydipsia, polyuria and weight loss  Vital Signs: Temp:  [97.4 F (36.3 C)] 97.4 F (36.3 C) (08/27 1200) Pulse Rate:  [95] 95  (08/27 1450) Resp:  [17-95] 17  (08/27 1450) BP: (138-147)/(74-92) 147/74 mmHg (08/27 1450) SpO2:  [99 %-100 %] 100 % (08/27 1450)    Physical Examination: Gen. Pleasant, , in no distress, normal affect ENT - no lesions, no post nasal drip, swelling over faace & BUEs Neck: No JVD, no thyromegaly, no carotid bruits, RIJ permacath  Lungs: no use  of accessory muscles, no dullness to percussion, clear without rales or rhonchi  Cardiovascular: Rhythm regular, heart sounds  normal, no murmurs, no peripheral edema Abdomen: soft and non-tender, no hepatosplenomegaly, BS normal. Musculoskeletal: No deformities, no cyanosis or clubbing, healed ulcers over BLEs Neuro:  alert, non focal Skin:  Warm,   erythematous papular rash over upper extremities &  Ears, Sacral decub      Labs and Imaging:   CBC    Component Value Date/Time   WBC 11.9* 04/22/2012 1304   RBC 3.55* 04/22/2012 1304   HGB 11.1* 04/22/2012 1304   HCT 35.3* 04/22/2012 1304   PLT 425* 04/22/2012 1304   MCV 99.4 04/22/2012 1304   MCH 31.3 04/22/2012 1304   MCHC 31.4 04/22/2012 1304   RDW 18.7* 04/22/2012 1304   LYMPHSABS 1.0 03/07/2012 1419   MONOABS 0.7 03/07/2012 1419   EOSABS 0.2 03/07/2012 1419   BASOSABS 0.1 03/07/2012 1419    BMET    Component Value Date/Time   NA 130* 04/22/2012 1304   K 5.7* 04/22/2012 1304   CL 91* 04/22/2012 1304   CO2 14* 04/22/2012 1304   GLUCOSE 324* 04/22/2012 1304   BUN 84* 04/22/2012 1304   CREATININE 6.38* 04/22/2012 1304   CREATININE 1.78* 10/17/2008 2000   CALCIUM 9.7 04/22/2012 1304   CALCIUM 8.3* 08/21/2011 0533   GFRNONAA 7* 04/22/2012 1304   GFRAA 8* 04/22/2012 1304    pCXR - small Rt effusion, RIJ catheter Assessment and Plan:    ASSESSMENT AND PLAN  RENAL   A:  ESRD , last HD 8/22 - missed 2 sessions Hyperkalemia/ volume overload P:   Spoke to Renal - needs emergent HD , ICU adm until then   Heme - h/o DVT/ PE , not on coumadin Start heprain empiric for SVC syndrome - d/w vascular until study obtained  PULMONARY No results found for this basename: PHART:5,PCO2:5,PCO2ART:5,PO2ART:5,HCO3:5,O2SAT:5 in the last 168 hours   A:  Rt effusion, volume overload ? OSA  -undiagnosed  P: use CPAP during sleep  CARDIOVASCULAR No results found for this basename: TROPONINI:5,LATICACIDVEN:5, O2SATVEN:5,PROBNP:5 in the last 168  hours ECG:  nSR  A: CAD s/p stents (hochrein) P:  Ct plavix    GASTROINTESTINAL No results found for this basename: AST:5,ALT:5,ALKPHOS:5,BILITOT:5,PROT:5,ALBUMIN:5 in the last 168 hours  A:  gastroparesis P:   reglan / phenergan prn (GI -  [facility])     INFECTIOUS  Lab 04/22/12 1304  WBC 11.9*  PROCALCITON --     A:  Rash suspicious for mRSA P:   Vanc per pharmacy  ENDOCRINE No results found for this basename: GLUCAP:5 in the last 168 hours A:  DM-2   P:   SSI order set  NEUROLOGIC  A:  ENcephalopathy/ lethargy P:   Due to benadryl  GLOBAL : WIll need contrast study per vascular for SVC angioplasty, will use 12 h radiology protocol with solumedrol q 4-6h x 3 doses (last dose of medrol & & benadryl 1h prior to study )  BEST PRACTICE / DISPOSITION     Best practices / Disposition: -->ICU status under PCCM -->full code -->Heparin for DVT Px -->Protonix for GI Px -->diet -->family updated at bedside  The patient is critically ill with multiple organ systems failure and requires high complexity decision making for assessment and support, frequent evaluation and titration of therapies, application of advanced monitoring technologies and extensive interpretation of multiple databases. Critical Care Time devoted to patient care services described in this note is 50 minutes.  ALVA,RAKESH V. 04/22/2012, 3:57 PM

## 2012-04-22 NOTE — ED Provider Notes (Addendum)
History     CSN: [id number]  Arrival date & time 04/22/12  1152   First MD Initiated Contact with Patient 04/22/12 1331      Chief Complaint  Patient presents with  . Chest Pain  . Facial Swelling  . Shortness of Breath    (Consider location/radiation/quality/duration/timing/severity/associated sxs/prior treatment) Patient is a 41 y.o. female presenting with shortness of breath. The history is provided by the patient, a parent, a relative and medical records. The history is limited by the condition of the patient.  Shortness of Breath  Associated symptoms include shortness of breath. Pertinent negatives include no chest pain, no fever and no cough.   she is a 41 year old, female, who has a history of renal failure, and prior DVT.  Recently, she had a dialysis catheter placed in her right upper chest wall.  She says since the catheter was placed.  She has had progressive swelling in her face, and upper extremities, along with shortness of breath.  She denies chest pain, nausea, or vomiting.  She has not had dialysis since last Thursday.  Level V caveat applies for severe illness  Past Medical History  Diagnosis Date  . DVT (deep venous thrombosis)     DVT HISTORY  . Diabetes mellitus   . Migraine     HISTORY  . Anxiety and depression   . CAD (coronary artery disease)     Last cath 2009:  LAD stent 80% stenosis. Circumflex 60-70% stenosis AV groove, right coronary artery 50-60% stenosis. She did have cutting balloon angioplasty of the LAD lesion into a diagonal. However, there was restenosis of this and it was managed medically.  . Bipolar 1 disorder   . Hyperlipidemia   . Hyperparathyroidism   . PE (pulmonary embolism)     HISTORY  . Dialysis patient   . Anemia   . Renal failure     dialysis [place] t,th sat  . Nephrotic syndrome   . Peripheral vascular disease   . MRSA infection   . Anxiety   . Depression   . Gastroparesis   . Complication of anesthesia   . PONV  (postoperative nausea and vomiting)     x3 days post anesth. on 12/19/2011  . Myocardial infarction     x3 , last one - 10 yrs. ago  . Hypertension     sees Dr. Antoine Poche - [place], last stress test 11/2011, see result in EPIC, saw Dr. Antoine Poche as well at that time    . Shortness of breath   . Recurrent upper respiratory infection (URI)     Bronchitis 11/2011 - saw Dr. Loney Hering, treating currently /w antibiotic   . GERD (gastroesophageal reflux disease)   . Pneumonia     APH- 2012  . Noncompliance of patient with renal dialysis     Past Surgical History  Procedure Date  . Incise and drain abcess     OF THIGHS FROM INSULIN INJECTIONS  . Heart stents   . Portacath placement 2003  . Cataract extraction w/phaco 11/09/2011    Procedure: CATARACT EXTRACTION PHACO AND INTRAOCULAR LENS PLACEMENT (IOC);  Surgeon: Edmon Crape, MD;  Location: [facility] OR;  Service: Ophthalmology;  Laterality: Right;  . Pars plana vitrectomy 11/09/2011    Procedure: PARS PLANA VITRECTOMY WITH 23 GAUGE;  Surgeon: Edmon Crape, MD;  Location: [facility] OR;  Service: Ophthalmology;  Laterality: Right;  Ahmed Valve; Scleral Reinforcement Graft  . Dg av dialysis  shunt access exist*l* or 12/13/11  left arm  . Dialysis cath inserted & portacath      dialysis catheter inserted & portacath d/c'd- 12/19/2011  . Back surgery X 4    x4 times   . Cholecystectomy   . Eye surgery   . Cardiac catheterization   . Aorta - bilateral femoral artery bypass graft 12/28/2011    Procedure: AORTA BIFEMORAL BYPASS GRAFT;  Surgeon: Nada Libman, MD;  Location: [facility] OR;  Service: Vascular;  Laterality: N/A;  AortoBifemoral Bypass using hemasheild 14x7 graft  . Femoral-popliteal bypass graft 12/28/2011    Procedure: BYPASS GRAFT FEMORAL-POPLITEAL ARTERY;  Surgeon: Nada Libman, MD;  Location: [facility] OR;  Service: Vascular;  Laterality: Right;  right femoral popliteal bypass using 6x80 propaten graft  . Insertion of dialysis catheter 02/15/2012    Procedure:  INSERTION OF DIALYSIS CATHETER;  Surgeon: Chuck Hint, MD;  Location: [facility] OR;  Service: Vascular;  Laterality: Right;  insertion of dialysis catheter right internal jugular vein,removal of left internal jugular vein dialysis catheter  . Insertion of dialysis catheter 02/21/2012    Procedure: INSERTION OF DIALYSIS CATHETER;  Surgeon: Chuck Hint, MD;  Location: [facility] OR;  Service: Cardiovascular;  Laterality: N/A;  Insertion new dialysis catheter; possible venoplasty of fibrin sheath of  Right IJ catheter  . Venogram 02/21/2012    Procedure: VENOGRAM;  Surgeon: Chuck Hint, MD;  Location: [facility] OR;  Service: Cardiovascular;  Laterality: N/A;  possible venoplasty    Family History  Problem Relation Age of Onset  . Transient ischemic attack Mother   . Hepatitis Sister   . Diabetes type II Other   . Anesthesia problems Neg Hx   . Hypotension Neg Hx   . Malignant hyperthermia Neg Hx   . Pseudochol deficiency Neg Hx     History  Substance Use Topics  . Smoking status: Current Everyday Smoker -- 1.0 packs/day for 20 years    Types: Cigarettes  . Smokeless tobacco: Never Used   Comment: pt states she is not ready to quit   . Alcohol Use: No    OB History    Grav Para Term Preterm Abortions TAB SAB Ect Mult Living                  Review of Systems  Constitutional: Negative for fever, chills and diaphoresis.  HENT: Positive for facial swelling. Negative for congestion.   Respiratory: Positive for shortness of breath. Negative for cough and chest tightness.   Cardiovascular: Negative for chest pain.  Gastrointestinal: Negative for nausea, vomiting and abdominal pain.  Musculoskeletal: Negative for back pain.  Skin: Positive for rash.  Neurological: Positive for headaches.  Hematological: Does not bruise/bleed easily.  Psychiatric/Behavioral: Positive for decreased concentration.    Allergies  Compazine; Contrast media; Meperidine hcl; Metformin and related;  Morphine; Nubain; Tobramycin; Topiramate; and Toradol  Home Medications   Current Outpatient Rx  Name Route Sig Dispense Refill  . ALBUTEROL SULFATE HFA 108 (90 BASE) MCG/ACT IN AERS Inhalation Inhale 2 puffs into the lungs every 6 (six) hours as needed. Shortness of breath    . ALPRAZOLAM 1 MG PO TABS Oral Take 1 mg by mouth 3 (three) times daily as needed. For anxiety    . ARIPIPRAZOLE 2 MG PO TABS Oral Take 10 mg by mouth daily.     . BC HEADACHE POWDER PO Oral Take 0.5 packets by mouth daily after breakfast. headache    . NEPHRO-VITE 0.8 MG PO TABS Oral Take 0.8 mg  by mouth at bedtime.    Marland Kitchen BACITRACIN OP Both Eyes Place 1 drop into both eyes 6 (six) times daily.    Marland Kitchen BUTALBITAL-APAP-CAFFEINE 50-325-40 MG PO TABS Oral Take 1 tablet by mouth 3 (three) times daily after meals. For headaches    . CINACALCET HCL 30 MG PO TABS Oral Take 30 mg by mouth daily with supper.     Marland Kitchen CLOPIDOGREL BISULFATE 75 MG PO TABS Oral Take 75 mg by mouth daily. Last dose 12/17/2011    . DARBEPOETIN ALFA-POLYSORBATE 60 MCG/0.3ML IJ SOLN Intravenous Inject 60 mcg into the vein every Saturday with hemodialysis.    . INSULIN GLARGINE 100 UNIT/ML [place] SOLN Subcutaneous Inject 33 Units into the skin at bedtime.     . INSULIN LISPRO (HUMAN) 100 UNIT/ML [place] SOLN Subcutaneous Inject 7-15 Units into the skin 4 (four) times daily -  before meals and at bedtime. TAKING ON SLIDING SCALE 200-249=7 units 250-299=10 units 300-349=12 units >350=15 units    . OFLOXACIN 0.3 % OP SOLN Both Eyes Place 1 drop into both eyes 4 (four) times daily.     Marland Kitchen OMEPRAZOLE 40 MG PO CPDR Oral Take 40 mg by mouth daily.     . OXYCODONE-ACETAMINOPHEN 10-325 MG PO TABS Oral Take 0.5 tablets by mouth every 4 (four) hours as needed. For pain    . PAROXETINE HCL 40 MG PO TABS Oral Take 80 mg by mouth every morning.     Marland Kitchen PROMETHAZINE HCL 25 MG PO TABS Oral Take 1 tablet (25 mg total) by mouth 2 (two) times daily as needed for nausea. 45 tablet 0  .  PYRIDOXINE HCL 100 MG PO TABS Oral Take 100 mg by mouth daily.    Marland Kitchen SEVELAMER CARBONATE 800 MG PO TABS Oral Take 1,600-4,000 mg by mouth 5 (five) times daily. *Take 5 tablets daily with each meal and take 2 tablets with snacks**    . SYSTANE LID WIPES EX PADS Right Eye Place 1 application into the right eye daily.    Marland Kitchen SANTYL 250 UNIT/GM EX OINT  APPLY DAILY 30 g 1    BP 147/74  Pulse 95  Temp 97.4 F (36.3 C) (Oral)  Resp 17  SpO2 100%  LMP 09/03/2008  Physical Exam  Nursing note and vitals reviewed. Constitutional: She is oriented to person, place, and time.       Obese female, somnolent.    HENT:  Head: Normocephalic and atraumatic.  Mouth/Throat: Oropharyngeal exudate present.       Pustules in bilat. Pinna. Normal tms.  Dry oral mucosa  Eyes: Conjunctivae and EOM are normal.  Neck:       4 + symmetric swelling in face and neck.  nontender  Cardiovascular: Normal rate.   No murmur heard. Pulmonary/Chest: She has no rales.       Shallow respirations  Abdominal: Soft. She exhibits no distension. There is no tenderness.  Musculoskeletal: She exhibits edema and tenderness.       bilat tense swelling in arms  Right calf is swollen and ttp.    Neurological: She is alert and oriented to person, place, and time.       Somnolent.  Hard to keep aroused  Skin: Skin is warm and dry.  Psychiatric:       somnolent    ED Course  Procedures (including critical care time) 41 year old, female, with indwelling dialysis catheter in the right upper chest, presents with progressive swelling in her face, and upper extremities, along  with shortness of breath.  Her symptoms and physical exam aren't consistent with superior vena cava syndrome.  Because she has an allergy to contrast dye, and renal failure.  We will admit her to the hospital for CTs after she has had treatment with steroids, and Benadryl.  I will start her on heparin because I have a high suspicion for.  SVC syndrome, and  diffuse clotting.  Labs Reviewed  CBC - Abnormal; Notable for the following:    WBC 11.9 (*)     RBC 3.55 (*)     Hemoglobin 11.1 (*)     HCT 35.3 (*)     RDW 18.7 (*)     Platelets 425 (*)     All other components within normal limits  BASIC METABOLIC PANEL - Abnormal; Notable for the following:    Sodium 130 (*)     Potassium 5.7 (*)     Chloride 91 (*)     CO2 14 (*)     Glucose, Bld 324 (*)     BUN 84 (*)     Creatinine, Ser 6.38 (*)     GFR calc non Af Amer 7 (*)     GFR calc Af Amer 8 (*)     All other components within normal limits  PROTIME-INR  POCT I-STAT TROPONIN I  D-DIMER, QUANTITATIVE   Dg Chest 2 View  04/22/2012  *RADIOLOGY REPORT*  Clinical Data: Chest pain and shortness of breath.  CHEST - 2 VIEW  Comparison: Chest x-ray 03/07/2012.  Findings: Lung volumes are low.  No consolidative airspace disease. Small right pleural effusion.  Pulmonary vasculature is normal. Heart size is upper limits of normal.  Mediastinal contours are unremarkable.  Right-sided internal jugular PermCath with tips terminating in the right atrium.  Atherosclerosis in the thoracic aorta.  IMPRESSION: 1.  Small right-sided pleural effusion. 2.  Atherosclerosis.   Original Report Authenticated By: Florencia Reasons, M.D.    ECG  NSR 95 BPM Nl axis Nl intervals Poor r wave progression Nl sttw  No diagnosis found.  I spoke with the radiologist concerning the proper.  Call, and tests to diagnose SVC syndrome.  I spoke with the intensive care physician.  He will evaluate the patient and admit the patient to the hospital for further treatment and diagnostic testing.  I spoke with the vascular surgeon, as well, since his colleague, place, a dialysis catheter, and Dr. Darrick Penna will come by to evaluate the patient in the emergency department.  CRITICAL CARE Performed by: Nicholes Stairs   Total critical care time: 60 min  Critical care time was exclusive of separately billable procedures  and treating other patients.  Critical care was necessary to treat or prevent imminent or life-threatening deterioration.  Critical care was time spent personally by me on the following activities: development of treatment plan with patient and/or surrogate as well as nursing, discussions with consultants, evaluation of patient's response to treatment, examination of patient, obtaining history from patient or surrogate, ordering and performing treatments and interventions, ordering and review of laboratory studies, ordering and review of radiographic studies, pulse oximetry and re-evaluation of patient's condition.  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

## 2012-04-22 NOTE — ED Notes (Signed)
Pt c/o rash with swelling to face starting yesterday; pt sts pain in chest with SOB starting today; pt speaking complete sentences; pt denies any new meds; pt sts two bumps on buttocks that are painful and red

## 2012-04-22 NOTE — Progress Notes (Signed)
PT CRYING HI STERICALLY D/T "BACK PAIN" REPOSITIONED AND TYLENOL WAS GIVEN BECAUSE LOW BP. REQUESTED TO END HD TX EARLY, CONCERN FOR PT HEALTH AND VANCOMYCIN IS RUNNING NOW. EXPLAINED TO PT THE IMPORTANCE TO STAY ON HD MACHINE TO HAVE A FULL TX, SHE MISSED HD AND TRYING TO PULL 5L. CALLED DR. MATTINGLY TO INFORM OF PT REQUEST AND DR. ALBO TO REQUEST STRONGER PAIN MED TO EASE PAIN AND MAYBE PT WILL REMAIN FOR FULL HD TX. WILL CONTINUE TO ASSESS AND EDUCATE AS NECCESSARY.

## 2012-04-22 NOTE — ED Notes (Signed)
Pt refusing blood culture

## 2012-04-22 NOTE — Progress Notes (Signed)
Moderate pain  Oxycodone prescribed for prn use.

## 2012-04-23 ENCOUNTER — Encounter (HOSPITAL_COMMUNITY): Payer: Self-pay

## 2012-04-23 ENCOUNTER — Encounter (HOSPITAL_COMMUNITY): Payer: Self-pay | Admitting: Certified Registered"

## 2012-04-23 ENCOUNTER — Encounter (HOSPITAL_COMMUNITY)
Admission: EM | Disposition: A | Discharge status: Home Health | Payer: Self-pay | Source: Home / Self Care | Attending: Pulmonary Disease

## 2012-04-23 ENCOUNTER — Inpatient Hospital Stay (HOSPITAL_COMMUNITY): Payer: Medicare Other

## 2012-04-23 ENCOUNTER — Inpatient Hospital Stay (HOSPITAL_COMMUNITY): Payer: Medicare Other | Admitting: Certified Registered"

## 2012-04-23 DIAGNOSIS — J90 Pleural effusion, not elsewhere classified: Secondary | ICD-10-CM

## 2012-04-23 HISTORY — PX: INSERTION OF DIALYSIS CATHETER: SHX1324

## 2012-04-23 LAB — POCT I-STAT 3, ART BLOOD GAS (G3+)
Acid-base deficit: 2 mmol/L (ref 0.0–2.0)
Bicarbonate: 24.3 mEq/L — ABNORMAL HIGH (ref 20.0–24.0)
O2 Saturation: 100 %
Patient temperature: 98.6
TCO2: 26 mmol/L (ref 0–100)
pO2, Arterial: 357 mmHg — ABNORMAL HIGH (ref 80.0–100.0)

## 2012-04-23 LAB — BASIC METABOLIC PANEL
BUN: 31 mg/dL — ABNORMAL HIGH (ref 6–23)
BUN: 38 mg/dL — ABNORMAL HIGH (ref 6–23)
Calcium: 9.5 mg/dL (ref 8.4–10.5)
Chloride: 98 mEq/L (ref 96–112)
Creatinine, Ser: 3.41 mg/dL — ABNORMAL HIGH (ref 0.50–1.10)
GFR calc Af Amer: 18 mL/min — ABNORMAL LOW (ref >90)
GFR calc Af Amer: 14 mL/min — ABNORMAL LOW (ref >90)
Glucose, Bld: 93 mg/dL (ref 70–99)
Potassium: 4.5 mEq/L (ref 3.5–5.1)
Sodium: 140 mEq/L (ref 135–145)

## 2012-04-23 LAB — CBC
Hemoglobin: 10 g/dL — ABNORMAL LOW (ref 12.0–15.0)
MCH: 31.5 pg (ref 26.0–34.0)
MCHC: 32.1 g/dL (ref 30.0–36.0)
MCV: 98.4 fL (ref 78.0–100.0)
Platelets: DECREASED 10*3/uL (ref 150–400)

## 2012-04-23 LAB — PHOSPHORUS: Phosphorus: 6.7 mg/dL — ABNORMAL HIGH (ref 2.3–4.6)

## 2012-04-23 LAB — GLUCOSE, CAPILLARY
Glucose-Capillary: 221 mg/dL — ABNORMAL HIGH (ref 70–99)
Glucose-Capillary: 178 mg/dL — ABNORMAL HIGH (ref 70–99)
Glucose-Capillary: 167 mg/dL — ABNORMAL HIGH (ref 70–99)
Glucose-Capillary: 126 mg/dL — ABNORMAL HIGH (ref 70–99)
Glucose-Capillary: 142 mg/dL — ABNORMAL HIGH (ref 70–99)
Glucose-Capillary: 103 mg/dL — ABNORMAL HIGH (ref 70–99)
Glucose-Capillary: 87 mg/dL (ref 70–99)

## 2012-04-23 LAB — MRSA PCR SCREENING: MRSA by PCR: POSITIVE — AB

## 2012-04-23 SURGERY — INSERTION OF DIALYSIS CATHETER
Anesthesia: General | Site: Neck | Laterality: Right | Wound class: Clean

## 2012-04-23 SURGERY — VENOGRAM
Anesthesia: LOCAL

## 2012-04-23 MED ORDER — DEXTROSE 10 % IV SOLN: INTRAVENOUS | Status: DC | PRN

## 2012-04-23 MED ORDER — DIPHENHYDRAMINE HCL 50 MG/ML IJ SOLN
INTRAMUSCULAR | Status: DC | PRN
  Administered 2012-04-23: 25 mg via INTRAVENOUS

## 2012-04-23 MED ORDER — OXYCODONE-ACETAMINOPHEN 5-325 MG PO TABS
1.0000 | ORAL_TABLET | Freq: Once | ORAL | Status: AC
  Administered 2012-04-23: 1 via ORAL
  Filled 2012-04-23: qty 1

## 2012-04-23 MED ORDER — SODIUM CHLORIDE 0.9 % IR SOLN
Status: DC | PRN
  Administered 2012-04-23: 15:00:00

## 2012-04-23 MED ORDER — LIDOCAINE HCL (PF) 1 % IJ SOLN
INTRAMUSCULAR | Status: AC
  Filled 2012-04-23: qty 30

## 2012-04-23 MED ORDER — CHLORHEXIDINE GLUCONATE CLOTH 2 % EX PADS
6.0000 | MEDICATED_PAD | Freq: Every day | CUTANEOUS | Status: AC
  Administered 2012-04-23 – 2012-04-27 (×5): 6 via TOPICAL

## 2012-04-23 MED ORDER — CINACALCET HCL 30 MG PO TABS
30.0000 mg | ORAL_TABLET | Freq: Every day | ORAL | Status: DC
  Administered 2012-04-25 – 2012-05-05 (×9): 30 mg via ORAL
  Filled 2012-04-23 (×13): qty 1

## 2012-04-23 MED ORDER — INSULIN REGULAR BOLUS VIA INFUSION
0.0000 [IU] | Freq: Three times a day (TID) | INTRAVENOUS | Status: DC
  Filled 2012-04-23: qty 10

## 2012-04-23 MED ORDER — DEXTROSE 50 % IV SOLN: 25.0000 mL | INTRAVENOUS | Status: DC | PRN

## 2012-04-23 MED ORDER — ARIPIPRAZOLE 10 MG PO TABS
10.0000 mg | ORAL_TABLET | Freq: Every day | ORAL | Status: DC
Start: 2012-04-23 — End: 2012-05-05
  Administered 2012-04-25 – 2012-05-05 (×11): 10 mg via ORAL
  Filled 2012-04-23 (×15): qty 1

## 2012-04-23 MED ORDER — MUPIROCIN 2 % EX OINT
1.0000 "application " | TOPICAL_OINTMENT | Freq: Two times a day (BID) | CUTANEOUS | Status: AC
  Administered 2012-04-23 – 2012-04-27 (×9): 1 via NASAL
  Filled 2012-04-23: qty 22

## 2012-04-23 MED ORDER — PROPOFOL INFUSION 10 MG/ML OPTIME
INTRAVENOUS | Status: DC | PRN
  Administered 2012-04-23: 50 ug/kg/min via INTRAVENOUS

## 2012-04-23 MED ORDER — OXYCODONE-ACETAMINOPHEN 5-325 MG PO TABS
1.0000 | ORAL_TABLET | Freq: Four times a day (QID) | ORAL | Status: DC | PRN
  Administered 2012-04-23 – 2012-04-27 (×8): 1 via ORAL
  Filled 2012-04-23: qty 1
  Filled 2012-04-23: qty 2
  Filled 2012-04-23 (×7): qty 1

## 2012-04-23 MED ORDER — PROMETHAZINE HCL 25 MG/ML IJ SOLN: 6.2500 mg | INTRAMUSCULAR | Status: DC | PRN

## 2012-04-23 MED ORDER — SODIUM CHLORIDE 0.9 % IV SOLN
INTRAVENOUS | Status: DC
  Administered 2012-04-23: 1.2 [IU]/h via INTRAVENOUS
  Filled 2012-04-23: qty 1

## 2012-04-23 MED ORDER — FENTANYL CITRATE 0.05 MG/ML IJ SOLN
50.0000 ug | INTRAMUSCULAR | Status: DC | PRN
  Administered 2012-04-23: 100 ug via INTRAVENOUS
  Filled 2012-04-23 (×2): qty 2

## 2012-04-23 MED ORDER — SUCCINYLCHOLINE CHLORIDE 20 MG/ML IJ SOLN
INTRAMUSCULAR | Status: DC | PRN
  Administered 2012-04-23: 140 mg via INTRAVENOUS

## 2012-04-23 MED ORDER — 0.9 % SODIUM CHLORIDE (POUR BTL) OPTIME
TOPICAL | Status: DC | PRN
  Administered 2012-04-23: 1000 mL

## 2012-04-23 MED ORDER — SODIUM CHLORIDE 0.9 % IV SOLN
INTRAVENOUS | Status: DC
Start: 2012-04-23 — End: 2012-04-25

## 2012-04-23 MED ORDER — SODIUM CHLORIDE 0.9 % IV SOLN: INTRAVENOUS | Status: DC

## 2012-04-23 MED ORDER — HEPARIN SODIUM (PORCINE) 1000 UNIT/ML IJ SOLN
INTRAMUSCULAR | Status: DC | PRN
  Administered 2012-04-23: 4.6 mL

## 2012-04-23 MED ORDER — HEPARIN SODIUM (PORCINE) 1000 UNIT/ML DIALYSIS
40.0000 [IU]/kg | INTRAMUSCULAR | Status: DC | PRN
  Filled 2012-04-23: qty 4

## 2012-04-23 MED ORDER — PROPOFOL 10 MG/ML IV EMUL
5.0000 ug/kg/min | INTRAVENOUS | Status: DC
  Administered 2012-04-23: 10 ug/kg/min via INTRAVENOUS
  Administered 2012-04-24: 45 ug/kg/min via INTRAVENOUS
  Administered 2012-04-24: 40 ug/kg/min via INTRAVENOUS
  Filled 2012-04-23 (×9): qty 100

## 2012-04-23 MED ORDER — DEXTROSE-NACL 5-0.45 % IV SOLN: INTRAVENOUS | Status: DC

## 2012-04-23 MED ORDER — SODIUM CHLORIDE 0.9 % IV SOLN
50.0000 ug/h | INTRAVENOUS | Status: DC
  Administered 2012-04-23: 50 ug/h via INTRAVENOUS
  Administered 2012-04-24: 150 ug/h via INTRAVENOUS
  Filled 2012-04-23 (×4): qty 50

## 2012-04-23 MED ORDER — CEFAZOLIN SODIUM-DEXTROSE 2-3 GM-% IV SOLR
INTRAVENOUS | Status: AC
  Filled 2012-04-23: qty 50

## 2012-04-23 MED ORDER — CEFAZOLIN SODIUM 1-5 GM-% IV SOLN
INTRAVENOUS | Status: AC
  Filled 2012-04-23: qty 50

## 2012-04-23 MED ORDER — IOHEXOL 300 MG/ML  SOLN: INTRAMUSCULAR | Status: DC | PRN

## 2012-04-23 MED ORDER — ETOMIDATE 2 MG/ML IV SOLN
INTRAVENOUS | Status: DC | PRN
  Administered 2012-04-23: 10 mg via INTRAVENOUS

## 2012-04-23 MED ORDER — CEFAZOLIN SODIUM-DEXTROSE 2-3 GM-% IV SOLR
INTRAVENOUS | Status: DC | PRN
  Administered 2012-04-23: 2 g via INTRAVENOUS

## 2012-04-23 MED ORDER — POLYVINYL ALCOHOL 1.4 % OP SOLN
1.0000 [drp] | Freq: Every day | OPHTHALMIC | Status: DC
  Administered 2012-04-23 – 2012-05-05 (×13): 1 [drp] via OPHTHALMIC
  Filled 2012-04-23 (×2): qty 15

## 2012-04-23 MED ORDER — DIPHENHYDRAMINE HCL 50 MG/ML IJ SOLN
25.0000 mg | INTRAMUSCULAR | Status: AC
  Administered 2012-04-23: 25 mg via INTRAVENOUS
  Filled 2012-04-23: qty 1

## 2012-04-23 MED ORDER — INSULIN ASPART 100 UNIT/ML ~~LOC~~ SOLN
10.0000 [IU] | Freq: Once | SUBCUTANEOUS | Status: AC
  Administered 2012-04-23: 10 [IU] via SUBCUTANEOUS

## 2012-04-23 MED ORDER — PROPOFOL 10 MG/ML IV EMUL
INTRAVENOUS | Status: DC | PRN
  Administered 2012-04-23: 70 mg via INTRAVENOUS

## 2012-04-23 MED ORDER — HYDROMORPHONE HCL PF 1 MG/ML IJ SOLN: 0.2500 mg | INTRAMUSCULAR | Status: DC | PRN

## 2012-04-23 MED ORDER — SODIUM CHLORIDE 0.9 % IV SOLN
INTRAVENOUS | Status: DC | PRN
  Administered 2012-04-23: 14:00:00 via INTRAVENOUS

## 2012-04-23 MED ORDER — OXYCODONE-ACETAMINOPHEN 10-325 MG PO TABS: 0.5000 | ORAL_TABLET | Freq: Four times a day (QID) | ORAL | Status: DC | PRN

## 2012-04-23 MED ORDER — INSULIN GLARGINE 100 UNIT/ML ~~LOC~~ SOLN
10.0000 [IU] | SUBCUTANEOUS | Status: DC
  Administered 2012-04-23 – 2012-04-24 (×2): 10 [IU] via SUBCUTANEOUS

## 2012-04-23 MED ORDER — SYSTANE LID WIPES EX PADS
1.0000 | MEDICATED_PAD | Freq: Every day | CUTANEOUS | Status: DC
Start: 2012-04-23 — End: 2012-04-23

## 2012-04-23 MED ORDER — PAROXETINE HCL 30 MG PO TABS
80.0000 mg | ORAL_TABLET | Freq: Every day | ORAL | Status: DC
  Administered 2012-04-23 – 2012-05-05 (×12): 80 mg via ORAL
  Filled 2012-04-23 (×13): qty 1

## 2012-04-23 MED ORDER — IOHEXOL 300 MG/ML  SOLN
INTRAMUSCULAR | Status: DC | PRN
  Administered 2012-04-23: 41 mL via INTRAVENOUS

## 2012-04-23 MED ORDER — ROCURONIUM BROMIDE 100 MG/10ML IV SOLN
INTRAVENOUS | Status: DC | PRN
  Administered 2012-04-23: 40 mg via INTRAVENOUS
  Administered 2012-04-23: 10 mg via INTRAVENOUS

## 2012-04-23 MED ORDER — FENTANYL CITRATE 0.05 MG/ML IJ SOLN
INTRAMUSCULAR | Status: DC | PRN
  Administered 2012-04-23 (×3): 50 ug via INTRAVENOUS

## 2012-04-23 MED ORDER — HEPARIN SODIUM (PORCINE) 1000 UNIT/ML IJ SOLN
INTRAMUSCULAR | Status: AC
  Filled 2012-04-23: qty 1

## 2012-04-23 MED ORDER — MIDAZOLAM HCL 2 MG/2ML IJ SOLN
2.0000 mg | INTRAMUSCULAR | Status: DC | PRN
  Administered 2012-04-23 (×2): 2 mg via INTRAVENOUS
  Filled 2012-04-23 (×2): qty 2

## 2012-04-23 MED ORDER — FENTANYL BOLUS VIA INFUSION
50.0000 ug | Freq: Four times a day (QID) | INTRAVENOUS | Status: DC | PRN
  Filled 2012-04-23: qty 100

## 2012-04-23 MED ORDER — INSULIN ASPART 100 UNIT/ML ~~LOC~~ SOLN
0.0000 [IU] | SUBCUTANEOUS | Status: DC
  Administered 2012-04-25: 3 [IU] via SUBCUTANEOUS

## 2012-04-23 MED ORDER — SODIUM CHLORIDE 0.9 % IV SOLN
100.0000 [IU] | INTRAVENOUS | Status: DC | PRN
  Administered 2012-04-23: .8 [IU]/h via INTRAVENOUS

## 2012-04-23 MED ORDER — LIDOCAINE HCL (CARDIAC) 20 MG/ML IV SOLN
INTRAVENOUS | Status: DC | PRN
  Administered 2012-04-23: 80 mg via INTRAVENOUS

## 2012-04-23 MED ORDER — OFLOXACIN 0.3 % OP SOLN
1.0000 [drp] | Freq: Four times a day (QID) | OPHTHALMIC | Status: DC
  Administered 2012-04-23 – 2012-05-05 (×47): 1 [drp] via OPHTHALMIC
  Filled 2012-04-23 (×2): qty 5

## 2012-04-23 MED ORDER — METHYLPREDNISOLONE SODIUM SUCC 40 MG IJ SOLR
40.0000 mg | INTRAMUSCULAR | Status: AC
  Administered 2012-04-23: 40 mg via INTRAVENOUS
  Filled 2012-04-23: qty 1

## 2012-04-23 MED ORDER — CHLORHEXIDINE GLUCONATE 0.12 % MT SOLN
OROMUCOSAL | Status: AC
  Administered 2012-04-23: 15 mL
  Filled 2012-04-23: qty 15

## 2012-04-23 SURGICAL SUPPLY — 44 items
BAG BANDED W/RUBBER/TAPE 36X54 (MISCELLANEOUS) ×3 IMPLANT
BAG DECANTER FOR FLEXI CONT (MISCELLANEOUS) ×3 IMPLANT
CATH CANNON HEMO 15F 50CM (CATHETERS) IMPLANT
CATH CANNON HEMO 15FR 19 (HEMODIALYSIS SUPPLIES) ×3 IMPLANT
CATH CANNON HEMO 15FR 23CM (HEMODIALYSIS SUPPLIES) IMPLANT
CATH CANNON HEMO 15FR 31CM (HEMODIALYSIS SUPPLIES) IMPLANT
CATH CANNON HEMO 15FR 32CM (HEMODIALYSIS SUPPLIES) IMPLANT
CATH HEADHUNTER 5FR 65CM (MISCELLANEOUS) ×3 IMPLANT
CHLORAPREP W/TINT 26ML (MISCELLANEOUS) ×3 IMPLANT
CLOTH BEACON ORANGE TIMEOUT ST (SAFETY) ×3 IMPLANT
COVER PROBE W GEL 5X96 (DRAPES) ×3 IMPLANT
COVER SURGICAL LIGHT HANDLE (MISCELLANEOUS) ×3 IMPLANT
DECANTER SPIKE VIAL GLASS SM (MISCELLANEOUS) ×3 IMPLANT
DRAPE C-ARM 42X72 X-RAY (DRAPES) ×3 IMPLANT
DRAPE CHEST BREAST 15X10 FENES (DRAPES) ×3 IMPLANT
GAUZE SPONGE 2X2 8PLY STRL LF (GAUZE/BANDAGES/DRESSINGS) IMPLANT
GAUZE SPONGE 4X4 16PLY XRAY LF (GAUZE/BANDAGES/DRESSINGS) ×3 IMPLANT
GLOVE BIO SURGEON STRL SZ7.5 (GLOVE) ×3 IMPLANT
GLOVE SURG SS PI 7.0 STRL IVOR (GLOVE) ×9 IMPLANT
GOWN PREVENTION PLUS XLARGE (GOWN DISPOSABLE) ×12 IMPLANT
GOWN STRL NON-REIN LRG LVL3 (GOWN DISPOSABLE) IMPLANT
KIT BASIN OR (CUSTOM PROCEDURE TRAY) ×3 IMPLANT
KIT ROOM TURNOVER OR (KITS) ×3 IMPLANT
NEEDLE 18GX1X1/2 (RX/OR ONLY) (NEEDLE) ×3 IMPLANT
NEEDLE HYPO 25GX1X1/2 BEV (NEEDLE) ×3 IMPLANT
NS IRRIG 1000ML POUR BTL (IV SOLUTION) ×3 IMPLANT
PACK SURGICAL SETUP 50X90 (CUSTOM PROCEDURE TRAY) ×3 IMPLANT
PAD ARMBOARD 7.5X6 YLW CONV (MISCELLANEOUS) ×6 IMPLANT
SHEATH BRITE TIP 7FRX11 (SHEATH) ×3 IMPLANT
SPONGE GAUZE 2X2 STER 10/PKG (GAUZE/BANDAGES/DRESSINGS)
SPONGE GAUZE 4X4 12PLY (GAUZE/BANDAGES/DRESSINGS) ×3 IMPLANT
SUT ETHILON 3 0 PS 1 (SUTURE) ×3 IMPLANT
SUT VICRYL 4-0 PS2 18IN ABS (SUTURE) ×3 IMPLANT
SYR 20CC LL (SYRINGE) ×6 IMPLANT
SYR 30ML LL (SYRINGE) IMPLANT
SYR 5ML LL (SYRINGE) ×6 IMPLANT
SYR CONTROL 10ML LL (SYRINGE) IMPLANT
SYRINGE 10CC LL (SYRINGE) IMPLANT
TAPE CLOTH SURG 4X10 WHT LF (GAUZE/BANDAGES/DRESSINGS) ×3 IMPLANT
TOWEL OR 17X24 6PK STRL BLUE (TOWEL DISPOSABLE) ×3 IMPLANT
TOWEL OR 17X26 10 PK STRL BLUE (TOWEL DISPOSABLE) ×3 IMPLANT
WATER STERILE IRR 1000ML POUR (IV SOLUTION) ×3 IMPLANT
WIRE AMPLATZ SS-J .035X180CM (WIRE) ×3 IMPLANT
WIRE BENTSON .035X145CM (WIRE) ×3 IMPLANT

## 2012-04-23 NOTE — Progress Notes (Signed)
Procedure details discussed with family today.  She has very limited access options at this point.  She has a functional SVC occlusion.  Removal of her catheter would probably not resolve her swelling issues as this diffusely narrowed SVC will probably quickly occlude if the catheter is removed.  She is not a very good candidate for a thigh graft in light of recent aortobifem and several groin wound issues.  One option would be to stent her entire SVC which would be of limited durability but may buy some time to allow some of the edema to subside.  I spoke with interventional radiology about this and they thought similar to above.  I gave all of these options to the family and also discussed her limited access options as well as limited durability of her remaining options.  They stated the patient has been miserable for the last 6 months and they are considering whether or not any further procedures should be done in light of the patient's current poor quality of life.  The family is going to talk about these issues tonight.    Would opt currently to see if airway can be managed and the pt extubated without any further procedures.  She should collateralize with time and her edema was somewhat better today post dialysis.  I am available if there are further questions regarding her access or discussions with the family.  Dr Allena Katz and Dr Vassie Loll updated.  Fabienne Bruns, MD [facility] Office: [phone] Pager: [phone]

## 2012-04-23 NOTE — Progress Notes (Signed)
04/23/12 1200 OR HOLDING Glucose 82, IV Insulin drip adjusted to 0.2 per glucose stabilizer. P Hadlyn Amero,RN

## 2012-04-23 NOTE — Anesthesia Postprocedure Evaluation (Signed)
  Anesthesia Post-op Note  Patient: [name]  Procedure(s) Performed: Procedure(s) (LRB): INSERTION OF DIALYSIS CATHETER (Right) VENOPLASTY (N/A) REMOVAL OF A DIALYSIS CATHETER (Right)  Patient Location: ICU  Anesthesia Type: General  Level of Consciousness: sedated  Airway and Oxygen Therapy: Mech Vent  Post-op Pain: mild  Post-op Assessment: Post-op Vital signs reviewed, Patient's Cardiovascular Status Stable, Respiratory Function Stable, Patent Airway and No signs of Nausea or vomiting  Post-op Vital Signs: stable  Complications: No apparent anesthesia complications

## 2012-04-23 NOTE — Progress Notes (Signed)
Pt having multiple episodes in which RR decreases, oxygen saturation drops to 60s/70s and HR decreases as low as 41.  During bradycardia, multiple multi-focal PVCs noted.  MD Deterding made aware.  Instructed to apply face mask to allow for more oxygen and monitor.  Will continue to monitor.

## 2012-04-23 NOTE — Progress Notes (Signed)
MD Deterding made aware of BS 299.  One time order for novolog received, given.  Will continue to monitor.  Ulis Rias, RN

## 2012-04-23 NOTE — Progress Notes (Signed)
ANTICOAGULATION/ANTIBIOTIC CONSULT NOTE - Initial Consult  Pharmacy Consult for heparin / vancomycin Indication: UE clot s/p portacath insertion / possible MRSA buttocks infection   Allergies  Allergen Reactions  . Compazine (Prochlorperazine) Swelling    Per echart records  . Contrast Media (Iodinated Diagnostic Agents) Swelling    Per echart records  . Meperidine Hcl Swelling  . Metformin And Related Other (See Comments)    'just not myself, was told not to take metformin'  . Morphine Swelling  . Nubain (Nalbuphine Hcl) Swelling  . Tobramycin   . Topiramate (Topamax) Swelling    Tongue swelling - per echart records  . Toradol (Ketorolac Tromethamine) Swelling    Patient Measurements: Weight:  (UNABLE TO WT, PT ON STRETCHER) Heparin Dosing Weight: 83kg 7/13  Vital Signs: Temp: 97.6 F (36.4 C) (08/27 2210) Temp src: Oral (08/27 2210) BP: 137/72 mmHg (08/27 2300) Pulse Rate: 107  (08/27 2300)  Labs:  Basename 04/22/12 2312 04/22/12 1304  HGB -- 11.1*  HCT -- 35.3*  PLT -- 425*  APTT -- --  LABPROT -- 14.5  INR -- 1.11  HEPARINUNFRC <0.10* --  CREATININE -- 6.38*  CKTOTAL -- --  CKMB -- --  TROPONINI -- --    The CrCl is unknown because both a height and weight (above a minimum accepted value) are required for this calculation.   Medical History: Past Medical History  Diagnosis Date  . DVT (deep venous thrombosis)     DVT HISTORY  . Diabetes mellitus   . Migraine     HISTORY  . Anxiety and depression   . CAD (coronary artery disease)     Last cath 2009:  LAD stent 80% stenosis. Circumflex 60-70% stenosis AV groove, right coronary artery 50-60% stenosis. She did have cutting balloon angioplasty of the LAD lesion into a diagonal. However, there was restenosis of this and it was managed medically.  . Bipolar 1 disorder   . Hyperlipidemia   . Hyperparathyroidism   . PE (pulmonary embolism)     HISTORY  . Dialysis patient   . Anemia   . Renal failure       dialysis [place] t,th sat  . Nephrotic syndrome   . Peripheral vascular disease   . MRSA infection   . Anxiety   . Depression   . Gastroparesis   . Complication of anesthesia   . PONV (postoperative nausea and vomiting)     x3 days post anesth. on 12/19/2011  . Myocardial infarction     x3 , last one - 10 yrs. ago  . Hypertension     sees Dr. Antoine Poche - [place], last stress test 11/2011, see result in EPIC, saw Dr. Antoine Poche as well at that time    . Shortness of breath   . Recurrent upper respiratory infection (URI)     Bronchitis 11/2011 - saw Dr. Loney Hering, treating currently /w antibiotic   . GERD (gastroesophageal reflux disease)   . Pneumonia     APH- 2012  . Noncompliance of patient with renal dialysis     Assessment: Heparin level for UE clot undetectable because heparin was never started. Pt has been in HD and rn on 2300 has recently received pt. RN will start now with bolus. As previously ordered  Goal of Therapy:  Heparin level 0.3-0.7 units/ml Monitor platelets by anticoagulation protocol: Yes Vancomycin Pre-HD level  = 25 Plan:  Heparin bolus 4000 uts IV x1 Heparin drip 1000 uts/hr  To begin now. HL in 8  hours. (renal)  Janice Coffin 04/23/2012,12:14 AM

## 2012-04-23 NOTE — Op Note (Signed)
Procedure: #1 bilateral central venogram #2 superior venacavogram #3 removal of right internal jugular vein Diatek catheter #4 placement of right internal jugular vein Diatek catheter (23 cm)  Preoperative diagnosis: Superior vena cava syndrome  Postoperative diagnosis: Same  Anesthesia: Gen.  Operative findings: 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                                 #2 diffusely narrowed right internal jugular vein a right subclavian vein and superior vena cava to                                     approximately 5 mm lumen                                 #3 23 cm Diatek catheter with tip in right atrium  Operative details: After obtaining informed consent, the patient was taken to the operating room. The patient was placed in supine position on the operating room table. Due to concerns about airway management the patient was intubated endotracheally by the anesthesia team.  General anesthetic was initiated.  Next the patient's entire neck and chest are prepped and draped in usual sterile fashion. A peripheral IV in the right upper extremity was accessed and contrast was injected up for central venogram. This showed basically collaterals along the right chest wall with no filling of any name central venous structure.  Next a peripheral IV in the left upper extremity was accessed and contrast was injected  o perform a central venogram. This also showed basically chest wall collaterals with no filling of the central venous structures.  At this point a transverse incision was made at the base of the right neck over a pre-existing dialysis catheter.  The catheter was dissected free circumferentially and elevated up in the operative field. Was clamped proximally and distally with hemostats and then divided. An 2 Bentson wire was then placed through the distal tip of the catheter down into the right atrium. The distal tip of the catheter was removed. A 5 French H1 catheter  was placed over the guidewire into the right atrium. This was then used to direct the wire into the inferior vena cava. The catheter was advanced deep into the inferior vena cava. The Bentson wire was then exchanged for an 035 Amplatz wire was was advanced all the way to the iliac vein bifurcation. Next, a 7 [place] short sheath was placed over the guidewire into the right internal jugular vein. Contrast injection was performed through this. This shows a patent right internal jugular vein right subclavian vein and superior vena cava. However, all these are diffusely narrowed to approximately 5 mm lumen. I did not feel that angioplasty of this entire segment would be durable. At this point a new 23 cm Diatek catheter was brought up in the operative field. The 16 French dilator and peel-away sheath placed over the guidewire the right atrium. Guidewire and dilator were removed and a 23 cm Diatek catheter placed over the wire technique into the right atrium. The peel-away sheath was then removed. The catheter was in tunneled subcutaneously cut to length. The catheter was noted to flush and draw easily. The catheter was loaded with concentrated heparin solution. The neck incision was closed  with a Vicryl stitch. The catheter was sutured to the skin with nylon sutures. The patient tolerated procedure well and there were no complications. Again due to airway edema, the patient was left intubated and returned to the ICU in stable condition.  We will have to weigh different options for managing her central venous issues to decrease airway edema. I discussed the current situation with my partner Dr. Arbie Cookey and Dr. Hart [place] as well as the critical care team Dr. Dorann Ou, MD [facility] Office: [phone] Pager: [phone]

## 2012-04-23 NOTE — Progress Notes (Signed)
Pt's last 3 blood sugars = 258, 299, 221, consecutively.  Per MD order, all SQ insulin orders D/C'd, orders placed to begin IV Insulin drip per GlucoStabilizer per protocol, and Part 2 ICU Hyperglycemia Protocol ordered.  Will start drip when received from pharmacy and continue to monitor.  Ulis Rias, RN

## 2012-04-23 NOTE — Progress Notes (Signed)
MRSA PCR positive per lab.

## 2012-04-23 NOTE — OR Nursing (Signed)
Fluoro time 3.0 Wallace Cullens - 133mG y

## 2012-04-23 NOTE — Progress Notes (Addendum)
Name: [name] MRN: [id number] DOB: [birth year]    LOS: 1  PCCM ADMISSION NOTE  History of Present Illness: 69 /F ESRD on HD tue/thu/sat, difficult access, last HD 8/22 , recent aortobifemoral bypass surgery ([facility]) presented 8/27 with gradually increasing swelling of face & BUEs, erythematous papular rash over upper extremities &  ears, gen weakness & worse wounds over buttocks. Labs show hyperkalemia. PCCM admitting since medically complex H/o contrast allergy Other PMH : DM-2, bipolar, prior MRSA   Per sister, LE & groin wounds have improved after bypass but sacral decubs have worsened & she has new rash , no fevers but feeling unwell to the point of missing HD x 2  Lines / Drains: RIJ permacath  7/22 exchanged 8/2 with SVC angioplasty (IR)  Cultures: bld cx 8/27 >>  Antibiotics: Vanc 8/27 >>  Tests / Events:  SUBJ : c/o pain, denies dyspnea,   Vital Signs: Temp:  [97.3 F (36.3 C)-98.1 F (36.7 C)] 98 F (36.7 C) (08/28 0737) Pulse Rate:  [74-120] 85  (08/28 0700) Resp:  [9-95] 15  (08/28 0700) BP: (88-197)/(53-96) 131/63 mmHg (08/28 0700) SpO2:  [92 %-100 %] 100 % (08/28 0800) FiO2 (%):  [2 %] 2 % (08/27 2129) Weight:  [193 lb 2 oz (87.6 kg)] 193 lb 2 oz (87.6 kg) (08/28 0500) I/O last 3 completed shifts: In: 561.2 [P.O.:400; I.V.:151.2; IV Piggyback:10] Out: 4005 [Urine:175; Other:3830]  Physical Examination: Gen. Pleasant, , in no distress, normal affect ENT - no lesions, no post nasal drip, swelling over faace & BUEs Neck: No JVD, no thyromegaly, no carotid bruits, RIJ permacath  Lungs: no use of accessory muscles, no dullness to percussion, clear without rales or rhonchi  Cardiovascular: Rhythm regular, heart sounds  normal, no murmurs, no peripheral edema Abdomen: soft and non-tender, no hepatosplenomegaly, BS normal. Musculoskeletal: No deformities, no cyanosis or clubbing, healed ulcers over BLEs Neuro:  alert, non focal Skin:  Warm,    erythematous papular rash over upper extremities &  Ears, Sacral decub   Vent Mode:  [-]  FiO2 (%):  [2 %] 2 %  Labs and Imaging:   CBC    Component Value Date/Time   WBC 11.9* 04/22/2012 1304   RBC 3.55* 04/22/2012 1304   HGB 11.1* 04/22/2012 1304   HCT 35.3* 04/22/2012 1304   PLT 425* 04/22/2012 1304   MCV 99.4 04/22/2012 1304   MCH 31.3 04/22/2012 1304   MCHC 31.4 04/22/2012 1304   RDW 18.7* 04/22/2012 1304   LYMPHSABS 1.0 03/07/2012 1419   MONOABS 0.7 03/07/2012 1419   EOSABS 0.2 03/07/2012 1419   BASOSABS 0.1 03/07/2012 1419    BMET    Component Value Date/Time   NA 134* 04/22/2012 2312   K 4.1 04/22/2012 2312   CL 94* 04/22/2012 2312   CO2 23 04/22/2012 2312   GLUCOSE 246* 04/22/2012 2312   BUN 31* 04/22/2012 2312   CREATININE 3.41* 04/22/2012 2312   CREATININE 1.78* 10/17/2008 2000   CALCIUM 9.5 04/22/2012 2312   CALCIUM 8.3* 08/21/2011 0533   GFRNONAA 16* 04/22/2012 2312   GFRAA 18* 04/22/2012 2312    pCXR - small Rt effusion, RIJ catheter Assessment and Plan:    ASSESSMENT AND PLAN  RENAL   A:  ESRD , last HD 8/22 - missed 2 sessions Hyperkalemia/ volume overload P:   Dialysed emergently 8/27 , rpt planned 8/28 after procedure  WIll need contrast study per vascular for SVC angioplasty, received solumedrol  x 2 doses ,  rpt  dose of medrol &  benadryl 1h prior to study   Heme - h/o DVT/ PE , not on coumadin Started heprain empiric for SVC syndrome -  Hold heparin prior to OR  PULMONARY No results found for this basename: PHART:5,PCO2:5,PCO2ART:5,PO2ART:5,HCO3:5,O2SAT:5 in the last 168 hours   A:  Rt effusion, volume overload ? OSA  -undiagnosed  P: use CPAP during sleep  CARDIOVASCULAR No results found for this basename: TROPONINI:5,LATICACIDVEN:5, O2SATVEN:5,PROBNP:5 in the last 168 hours ECG:  nSR  A: CAD s/p stents (hochrein) P:  Resume plavix after procedure    GASTROINTESTINAL No results found for this basename:  AST:5,ALT:5,ALKPHOS:5,BILITOT:5,PROT:5,ALBUMIN:5 in the last 168 hours  A:  gastroparesis P:   reglan / phenergan prn (GI -  Setzer)     INFECTIOUS  Lab 04/22/12 1304  WBC 11.9*  PROCALCITON --     A:  Rash suspicious for mRSA P:   Vanc per pharmacy Await cx data resumed eye drops  ENDOCRINE  Lab 04/23/12 0739 04/23/12 0348 04/22/12 2356 04/22/12 1649  GLUCAP 126* 221* 299* 258*   A:  DM-2  , worsened by solumedrol P:  Transition off insulin with lantus, takes 33 units at home   NEUROLOGIC  A:  ENcephalopathy/ lethargy due to benadryl- resolved Bipolar P:  Resume abilify Percocet- hold for excess sedation     BEST PRACTICE / DISPOSITION     Best practices / Disposition: -->ICU status under PCCM, can transfer out  If uneventful procedure -->full code -->Heparin for DVT Px -->Protonix for GI Px -->diet -->family updated at bedside  The patient is critically ill with multiple organ systems failure and requires high complexity decision making for assessment and support, frequent evaluation and titration of therapies, application of advanced monitoring technologies and extensive interpretation of multiple databases. Critical Care Time devoted to patient care services described in this note is 32 minutes.  ALVA,RAKESH V. 04/23/2012, 8:10 AM  [phone]

## 2012-04-23 NOTE — Progress Notes (Signed)
Compatibility of heparin and insulin verified with pharmacy.  Insulin gtt started per glucostabilizer.  Will continue to monitor.  Ulis Rias, RN

## 2012-04-23 NOTE — Progress Notes (Signed)
Pt refused morning labs.  After some education, pt has agreed to allow all labs to be drawn with her 08:30 heparin level.  Will continue to educate and monitor.    Ulis Rias, RN

## 2012-04-23 NOTE — Anesthesia Preprocedure Evaluation (Addendum)
Anesthesia Evaluation  Patient identified by MRN, date of birth, ID band Patient awake  General Assessment Comment:SVC syndrome  Reviewed: Allergy & Precautions, H&P , NPO status , Patient's Chart, lab work & pertinent test results  Airway Mallampati: II TM Distance: <3 FB Neck ROM: Full    Dental No notable dental hx.    Pulmonary shortness of breath and at rest, Current Smoker,  breath sounds clear to auscultation  Pulmonary exam normal       Cardiovascular hypertension, Pt. on medications + CAD, + Past MI, + Cardiac Stents and + Peripheral Vascular Disease Rhythm:Regular Rate:Normal     Neuro/Psych Bipolar Disorder negative neurological ROS     GI/Hepatic negative GI ROS, Neg liver ROS, GERD-  Medicated,  Endo/Other    Renal/GU DialysisRenal disease  negative genitourinary   Musculoskeletal negative musculoskeletal ROS (+)   Abdominal   Peds negative pediatric ROS (+)  Hematology negative hematology ROS (+)   Anesthesia Other Findings   Reproductive/Obstetrics negative OB ROS                         Anesthesia Physical Anesthesia Plan  ASA: IV  Anesthesia Plan: General   Post-op Pain Management:    Induction: Intravenous  Airway Management Planned: Oral ETT  Additional Equipment: Arterial line  Intra-op Plan:   Post-operative Plan: Possible Post-op intubation/ventilation  Informed Consent: I have reviewed the patients History and Physical, chart, labs and discussed the procedure including the risks, benefits and alternatives for the proposed anesthesia with the patient or authorized representative who has indicated his/her understanding and acceptance.   Dental advisory given  Plan Discussed with: CRNA and Surgeon  Anesthesia Plan Comments:         Anesthesia Quick Evaluation

## 2012-04-23 NOTE — Progress Notes (Signed)
Inpatient Diabetes Program Recommendations  AACE/ADA: New Consensus Statement on Inpatient Glycemic Control (2013)  Target Ranges:  Prepandial:   less than 140 mg/dL      Peak postprandial:   less than 180 mg/dL (1-2 hours)      Critically ill patients:  140 - 180 mg/dL    Patient currently on IV insulin drip per GlucoStabilizer. Takes the following insulins at home: Lantus 33 units QHS Humalog 7-15 units per SSI tid with meals  Inpatient Diabetes Program Recommendations Insulin - Basal: Please make sure patient gets a portion of her home Lantus dose 1-2 hours before IV insulin drip d/c'd.  Note: Will follow. Ambrose Finland RN, MSN, CDE Diabetes Coordinator Inpatient Diabetes Program [phone]

## 2012-04-23 NOTE — Transfer of Care (Signed)
Immediate Anesthesia Transfer of Care Note  Patient: [name]  Procedure(s) Performed: Procedure(s) (LRB): INSERTION OF DIALYSIS CATHETER (Right) VENOPLASTY (N/A) REMOVAL OF A DIALYSIS CATHETER (Right)  Patient Location: SICU  Anesthesia Type: General  Level of Consciousness: sedated and Patient remains intubated per anesthesia plan  Airway & Oxygen Therapy: Patient remains intubated per anesthesia plan  Post-op Assessment: Report given to PACU RN  Post vital signs: Reviewed and stable  Complications: No apparent anesthesia complications

## 2012-04-23 NOTE — Progress Notes (Signed)
D/w Dr Darrick Penna - no acute clot - can dc heparin Back from Or intubated Intermittent Sedation orders written, may need IR procedure - leave intubated for now until plan clear. 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

## 2012-04-23 NOTE — H&P (Addendum)
  The patient has been re-examined.  The patient's history and physical has been reviewed and is unchanged except slight improvement in voice and edema.  There is no change in the plan of care.  I dc'd her heparin personally at 12 pm.  Fabienne Bruns, MD

## 2012-04-23 NOTE — Consult Note (Signed)
Wound consult requested for sacrum and left leg.  Pt has been sent to the OR today.  Will perform WOC consult as requested tomorrow.  Cammie Mcgee, RN, MSN, [employer]  [phone]

## 2012-04-23 NOTE — Progress Notes (Signed)
Patient ID: [name], female   DOB: 1971/07/04, 41 y.o.   MRN: [id number]   [facility] Progress Note    Subjective:   Reports some improvement in shortness of breath Overnight events (including dialysis sheets) reviewed.   Objective:   BP 131/63  Pulse 85  Temp 98.1 F (36.7 C) (Oral)  Resp 15  Ht 5\' 2"  (1.575 m)  Wt 87.6 kg (193 lb 2 oz)  BMI 35.32 kg/m2  SpO2 100%  LMP 09/03/2008  Physical Exam: WUJ:[id number] resting in bed- on O2 via VM AOZ:HYQMV RRR, normal S1 and S2  Resp:Coarse/rhonchorous BS bilaterally HQI:ONGE, obese, NT, BS normal Ext:2+ edema LEs, 1+ edema UEs. Shallow ulcers over feet noted  Labs: BMET  Lab 04/22/12 2312 04/22/12 1304  NA 134* 130*  K 4.1 5.7*  CL 94* 91*  CO2 23 14*  GLUCOSE 246* 324*  BUN 31* 84*  CREATININE 3.41* 6.38*  ALB -- --  CALCIUM 9.5 9.7  PHOS -- --   CBC  Lab 04/22/12 1304  WBC 11.9*  NEUTROABS --  HGB 11.1*  HCT 35.3*  MCV 99.4  PLT 425*    Medications:      . acetaminophen      . aspirin  324 mg Oral NOW   Or  . aspirin  300 mg Rectal NOW  . diphenhydrAMINE  50 mg Intravenous Once  . heparin  4,000 Units Intravenous Once  . insulin aspart  10 Units Subcutaneous Once  . insulin regular  0-10 Units Intravenous TID WC  . methylPREDNISolone (SOLU-MEDROL) injection  125 mg Intravenous Once  . methylPREDNISolone (SOLU-MEDROL) injection  32 mg Intravenous Once  . methylPREDNISolone (SOLU-MEDROL) injection  32 mg Intravenous Once  . metoCLOPramide  5 mg Oral TID AC  . oxyCODONE-acetaminophen  1 tablet Oral Once  . oxyCODONE-acetaminophen  1 tablet Oral Once  . pantoprazole (PROTONIX) IV  40 mg Intravenous QHS  . vancomycin  1,500 mg Intravenous Once  . vancomycin  750 mg Intravenous Q T,Th,Sa-HD  . DISCONTD: insulin aspart  0-7 Units Subcutaneous Q4H     Assessment/ Plan:   1. End stage renal disease/volume overload: Fair response to UF on HD yesterday- still remains  significantly volume overloaded, plan to repeat HD later this PM (after SVC eval/intervention) or tomorrow-per usual schedule. 2. Superior vena cava syndrome: Plans noted by vascular surgery for intervention today-likely stent to SVC. Unfortunately, HD access options are limited with recent Ao-Bifem. 3. Hyperkalemia: Corrected with HD, will continue to monitor closely  4. Anemia of chronic kidney disease: Hemoglobin currently at goal, we'll restart ESA when hemoglobin starts trending down. 5. Decubiti/Shallow LE ulcers: On empiric IV vancomycin/analgesics     Zetta Bills, MD 04/23/2012, 7:11 AM

## 2012-04-24 ENCOUNTER — Inpatient Hospital Stay (HOSPITAL_COMMUNITY): Payer: Medicare Other

## 2012-04-24 ENCOUNTER — Encounter (HOSPITAL_COMMUNITY): Payer: Self-pay | Admitting: Vascular Surgery

## 2012-04-24 DIAGNOSIS — J9600 Acute respiratory failure, unspecified whether with hypoxia or hypercapnia: Secondary | ICD-10-CM | POA: Diagnosis not present

## 2012-04-24 LAB — POCT I-STAT 3, ART BLOOD GAS (G3+)
Acid-base deficit: 2 mmol/L (ref 0.0–2.0)
O2 Saturation: 92 %
TCO2: 26 mmol/L (ref 0–100)
pCO2 arterial: 48.2 mmHg — ABNORMAL HIGH (ref 35.0–45.0)

## 2012-04-24 LAB — GLUCOSE, CAPILLARY
Glucose-Capillary: 74 mg/dL (ref 70–99)
Glucose-Capillary: 75 mg/dL (ref 70–99)

## 2012-04-24 LAB — CBC
MCH: 30.8 pg (ref 26.0–34.0)
MCHC: 31.5 g/dL (ref 30.0–36.0)
MCV: 97.8 fL (ref 78.0–100.0)
Platelets: 340 10*3/uL (ref 150–400)
RDW: 18.4 % — ABNORMAL HIGH (ref 11.5–15.5)

## 2012-04-24 LAB — BASIC METABOLIC PANEL
Calcium: 9 mg/dL (ref 8.4–10.5)
Creatinine, Ser: 4.58 mg/dL — ABNORMAL HIGH (ref 0.50–1.10)
GFR calc non Af Amer: 11 mL/min — ABNORMAL LOW (ref >90)
Sodium: 136 mEq/L (ref 135–145)

## 2012-04-24 MED ORDER — ALTEPLASE 100 MG IV SOLR
4.0000 mg | Freq: Once | INTRAVENOUS | Status: AC
  Administered 2012-04-24: 4 mg
  Filled 2012-04-24: qty 4

## 2012-04-24 MED ORDER — ALBUMIN HUMAN 25 % IV SOLN
12.5000 g | Freq: Once | INTRAVENOUS | Status: AC
  Administered 2012-04-25: 12.5 g via INTRAVENOUS
  Filled 2012-04-24: qty 50

## 2012-04-24 MED ORDER — NONFORMULARY OR COMPOUNDED ITEM
1.0000 [drp] | Status: DC
  Administered 2012-04-24 – 2012-05-05 (×57): 1 [drp] via OPHTHALMIC
  Filled 2012-04-24: qty 1

## 2012-04-24 MED ORDER — SODIUM CHLORIDE 0.9 % IV SOLN
5.0000 mg | Freq: Once | INTRAVENOUS | Status: DC
  Filled 2012-04-24 (×2): qty 5

## 2012-04-24 MED ORDER — NON FORMULARY: 1.0000 [drp] | Status: DC

## 2012-04-24 MED ORDER — CHLORHEXIDINE GLUCONATE 0.12 % MT SOLN
OROMUCOSAL | Status: AC
  Administered 2012-04-24: 15 mL
  Filled 2012-04-24: qty 15

## 2012-04-24 MED ORDER — VANCOMYCIN HCL IN DEXTROSE 1-5 GM/200ML-% IV SOLN
1000.0000 mg | INTRAVENOUS | Status: DC
  Administered 2012-04-26 – 2012-04-29 (×2): 1000 mg via INTRAVENOUS
  Filled 2012-04-24 (×5): qty 200

## 2012-04-24 MED ORDER — ALTEPLASE 100 MG IV SOLR
2.0000 mg | Freq: Once | INTRAVENOUS | Status: DC
  Filled 2012-04-24: qty 2

## 2012-04-24 NOTE — Care Management Note (Unsigned)
    Page 1 of 1   04/24/2012     3:57:23 PM   CARE MANAGEMENT NOTE 04/24/2012  Patient:  CARREN, BLAKLEY   Account Number:  [id number]  Date Initiated:  04/24/2012  Documentation initiated by:  Denyse Fillion  Subjective/Objective Assessment:   PT ADM WITH ESRD AND SUPERIOR VENA CAVA SYNDROME.  PTA, PT LIVES WITH FAMILY.  SHE IS ACTIVE WITH [facility] FOR HH AIDE, AND [facility] FOR SKILLED SERVICES.     Action/Plan:   PT REMAINS INTUBATED CURRENTLY, S/P ANGIOGRAPHY.  WILL CONT TO MONITOR PROGRESS AND FOLLOW FOR DISCHARGE PLANNING. WILL MAKE ALL EFFORTS TO CONTACT FAMILY, AS NONE CURRENTLY AT BEDSIDE.   Anticipated DC Date:  04/29/2012   Anticipated DC Plan:  HOME W HOME HEALTH SERVICES      DC Planning Services  CM consult      Choice offered to / List presented to:             Status of service:  In process, will continue to follow Medicare Important Message given?   (If response is "NO", the following Medicare IM given date fields will be blank) Date Medicare IM given:   Date Additional Medicare IM given:    Discharge Disposition:    Per UR Regulation:  Reviewed for med. necessity/level of care/duration of stay  If discussed at Long Length of Stay Meetings, dates discussed:    Comments:

## 2012-04-24 NOTE — Progress Notes (Signed)
INITIAL ADULT NUTRITION ASSESSMENT Date: 04/24/2012   Time: 10:08 AM  INTERVENTION:  If EN started, recommend initiating Nepro formula at 10 ml/hr; once Propofol discontinued, add Prostat liquid protein 30 ml 6 times daily via tube to provide 1032 total kcals (67% of estimated kcal needs), 109 gm protein (100% of protein needs),174 ml of free water RD to follow for nutrition care plan  Reason for Assessment: VDRF, Low Braden  ASSESSMENT: Female 41 y.o.  Dx: SVC syndrome  Hx:  Past Medical History  Diagnosis Date  . DVT (deep venous thrombosis)     DVT HISTORY  . Diabetes mellitus   . Migraine     HISTORY  . Anxiety and depression   . CAD (coronary artery disease)     Last cath 2009:  LAD stent 80% stenosis. Circumflex 60-70% stenosis AV groove, right coronary artery 50-60% stenosis. She did have cutting balloon angioplasty of the LAD lesion into a diagonal. However, there was restenosis of this and it was managed medically.  . Bipolar 1 disorder   . Hyperlipidemia   . Hyperparathyroidism   . PE (pulmonary embolism)     HISTORY  . Dialysis patient   . Anemia   . Renal failure     dialysis [place] t,th sat  . Nephrotic syndrome   . Peripheral vascular disease   . MRSA infection   . Anxiety   . Depression   . Gastroparesis   . Complication of anesthesia   . PONV (postoperative nausea and vomiting)     x3 days post anesth. on 12/19/2011  . Myocardial infarction     x3 , last one - 10 yrs. ago  . Hypertension     sees Dr. Antoine Poche - [facility], last stress test 11/2011, see result in EPIC, saw Dr. Antoine Poche as well at that time    . Shortness of breath   . Recurrent upper respiratory infection (URI)     Bronchitis 11/2011 - saw Dr. Loney Hering, treating currently /w antibiotic   . GERD (gastroesophageal reflux disease)   . Pneumonia     [facility]- 2012  . Noncompliance of patient with renal dialysis     Related Meds:     . ARIPiprazole  10 mg Oral Daily  . chlorhexidine      .  Chlorhexidine Gluconate Cloth  6 each Topical Q0600  . cinacalcet  30 mg Oral Q supper  . insulin aspart  0-3 Units Subcutaneous Q4H  . insulin glargine  10 Units Subcutaneous Q24H  . insulin regular  0-10 Units Intravenous TID WC  . metoCLOPramide  5 mg Oral TID AC  . mupirocin ointment  1 application Nasal BID  . ofloxacin  1 drop Both Eyes QID  . pantoprazole (PROTONIX) IV  40 mg Intravenous QHS  . PARoxetine  80 mg Oral Daily  . polyvinyl alcohol  1 drop Right Eye Daily  . vancomycin  1,000 mg Intravenous Q T,Th,Sa-HD  . DISCONTD: vancomycin  750 mg Intravenous Q T,Th,Sa-HD    Ht: 5\' 2"  (157.5 cm)  Wt: 194 lb 14.2 oz (88.4 kg)  Ideal Wt: 50 kg % Ideal Wt: 176%  Usual Wt: 183 lb -- July 2013 % Usual Wt: 106%  Body mass index is 35.65 kg/(m^2).  Food/Nutrition Related Hx: no triggers per admission nutrition screen  Labs:  CMP     Component Value Date/Time   NA 136 04/24/2012 0756   K 4.0 04/24/2012 0756   CL 100 04/24/2012 0756   CO2  19 04/24/2012 0756   GLUCOSE 82 04/24/2012 0756   BUN 43* 04/24/2012 0756   CREATININE 4.58* 04/24/2012 0756   CREATININE 1.78* 10/17/2008 2000   CALCIUM 9.0 04/24/2012 0756   CALCIUM 8.3* 08/21/2011 0533   PROT 6.9 03/07/2012 1419   ALBUMIN 2.4* 03/07/2012 1419   AST 11 03/07/2012 1419   ALT 9 03/07/2012 1419   ALKPHOS 191* 03/07/2012 1419   BILITOT 0.1* 03/07/2012 1419   GFRNONAA 11* 04/24/2012 0756   GFRAA 13* 04/24/2012 0756    Phosphorus  Date Value Range Status  04/23/2012 6.7* 2.3 - 4.6 mg/dL Final     Intake/Output Summary (Last 24 hours) at 04/24/12 1043 Last data filed at 04/24/12 0900  Gross per 24 hour  Intake 1634.87 ml  Output      0 ml  Net 1634.87 ml    CBG (last 3)   Basename 04/24/12 0746 04/24/12 0356 04/24/12 0003  GLUCAP 74 75 75    Diet Order: NPO  Supplements/Tube Feeding: N/A  IVF:    sodium chloride   sodium chloride Last Rate: 20 mL/hr at 04/24/12 0400  dextrose   dextrose   fentaNYL infusion  INTRAVENOUS Last Rate: 60 mcg/hr (04/24/12 0900)  propofol Last Rate: Stopped (04/24/12 0800)  DISCONTD: heparin Last Rate: 1,000 Units/hr (04/23/12 1000)  DISCONTD: insulin (NOVOLIN-R) infusion Last Rate: Stopped (04/24/12 0000)    Estimated Nutritional Needs:   Kcal: 1550 Protein: 100-110 gm Fluid: per MD   Patient is currently intubated on ventilator support MV: 6.2 Temp: 36.7 Propofol: 13.1 ml/hr ----> 346 fat kcals  RD unable to obtain nutrition hx; presented 8/27 with gradually increasing swelling of face & BUEs; required emergent HD for hyperkalemia; s/p placement of right internal jugular vein Diatek catheter 8/28; CWOCN note reviewed -- patient with R leg with previous full thickness wound (appears to be healing) and sacrum with 2 wound areas, etiology unknown; no OGT or NGT presence at this time; per RN, unable to be placed given increased swelling.  NUTRITION DIAGNOSIS: -Inadequate oral intake (NI-2.1).  Status: Ongoing  RELATED TO: inability to eat  AS EVIDENCE BY: NPO status  MONITORING/EVALUATION(Goals): Goal: Initiate EN support within next 24-48 hours if prolonged intubation expected Monitor: EN initiation, respiratory status, Propofol infusion, weight, labs, I/O's  EDUCATION NEEDS: -No education needs identified at this time  Dietitian #: [phone]  DOCUMENTATION CODES Per approved criteria  -Obesity Unspecified    Alger Memos 04/24/2012, 10:08 AM

## 2012-04-24 NOTE — Progress Notes (Signed)
ANTIBIOTIC CONSULT NOTE - FOLLOW UP  Pharmacy Consult for Vancomycin Indication: possible MRSA buttocks infection  Allergies  Allergen Reactions  . Compazine (Prochlorperazine) Swelling    Per echart records  . Contrast Media (Iodinated Diagnostic Agents) Swelling    Per echart records  . Meperidine Hcl Swelling  . Metformin And Related Other (See Comments)    'just not myself, was told not to take metformin'  . Morphine Swelling  . Nubain (Nalbuphine Hcl) Swelling  . Tobramycin   . Topiramate (Topamax) Swelling    Tongue swelling - per echart records  . Toradol (Ketorolac Tromethamine) Swelling   Patient Measurements: Height: 5\' 2"  (157.5 cm) Weight: 194 lb 14.2 oz (88.4 kg) IBW/kg (Calculated) : 50.1   Vital Signs: Temp: 97.4 F (36.3 C) (08/29 0757) Temp src: Oral (08/29 0757) BP: 166/78 mmHg (08/29 0800) Pulse Rate: 96  (08/29 0900) Intake/Output from previous day: 08/28 0701 - 08/29 0700 In: 1615.7 [I.V.:1605.7; IV Piggyback:10] Out: -  Intake/Output from this shift: Total I/O In: 66.6 [I.V.:66.6] Out: -   Labs:  Basename 04/24/12 0756 04/23/12 0830 04/22/12 2312 04/22/12 1304  WBC 9.3 5.6 -- 11.9*  HGB 9.9* 10.0* -- 11.1*  PLT 340 PLATELET CLUMPS NOTED ON SMEAR, COUNT APPEARS DECREASED -- 425*  LABCREA -- -- -- --  CREATININE 4.58* 4.09* 3.41* --   Estimated Creatinine Clearance: 16.5 ml/min (by C-G formula based on Cr of 4.58).  Assessment: 41yof continues on day #3 vancomycin for buttocks wound concerning for MRSA. She is ESRD on HD TTS - currently receiving HD. Received a 1500mg  loading dose on 8/27. Maintenance dose of 750mg  qHD ordered, but need to increase dose based on weight of 88kg.   8/27 Vancomycin >> 8/27 blood cx x2 >>ngtd 8/27 - MRSA screen positive - 5 days CHG/Bactroban Day #2/5  Goal of Therapy:  Vancomycin pre-HD level 15-25  Plan:  1) Increase vancomycin to 1g IV qHD 2) Follow up HD plans, levels as necessary  Fredrik Rigger 04/24/2012,10:08 AM

## 2012-04-24 NOTE — Progress Notes (Signed)
eLink Physician-Brief Progress Note Patient Name: [name] DOB: 1971-05-30 MRN: [id number]  Date of Service  04/24/2012   HPI/Events of Note   SCDs for DVT prophylaxis.  eICU Interventions  Please assess if heparin is appropriate in AM.      YACOUB,WESAM 04/24/2012, 3:51 PM

## 2012-04-24 NOTE — Consult Note (Signed)
WOC consult Note Reason for Consult: Consult requested for right leg and sacrum wounds. Pt is intubated and unable to answer questions at this time. Wound type: Right leg previous full thickness wound which appears to be healing.  Pink dry scar tissue with partial thickness area in middle, red and dry, no odor or drainage.3X2X.1cm  Foam dressing to promote healing.  Sacrum has 2 areas of wounds, unknown etiology.  Each .2X.2cm, 100% covered by yellow slough, open easily when outer slough removed by slitting with scalpel.  Both sites full thickness. Drain mod tan-red drainage.  Left side with depth of .2cm, right side with depth of 1cm.   Pt has history of MRSA and previous back surgeries.   Pressure Ulcer POA: There are NOT PRESSURE ULCERS. Dressing procedure/placement/frequency: PT COULD BENEFIT FROM X_RAY OR CT SCAN TO R/O ABSCESS, since one location tunnels over previous back surgery location.  Pt on Sport low-air loss bed to reduce pressure.  Pack with Iodoform to absorb drainage and promote healing.  Cammie Mcgee, RN, MSN, [employer]  [phone]

## 2012-04-24 NOTE — Progress Notes (Signed)
Patient ID: [name], female   DOB: 1971/02/16, 41 y.o.   MRN: [id number]   [facility] Progress Note    Subjective:   Angiography findings and management difficulties noted from yesterday's notes. Intubated post-procedure for airway protection.   Objective:   BP 182/81  Pulse 80  Temp 97.4 F (36.3 C) (Oral)  Resp 3  Ht 5\' 2"  (1.575 m)  Wt 88.4 kg (194 lb 14.2 oz)  BMI 35.65 kg/m2  SpO2 100%  LMP 09/03/2008  Physical Exam: WUJ:[id number], awake/attempts to nod to questions NFA:OZHYQ RRR, normal S1 and S2  Resp:Coarse BS bilaterally, no rales MVH:QION, obese, NT, BS normal Ext:2+ LE edema, 1+ UE edema  Labs: BMET  Lab 04/23/12 0830 04/22/12 2312 04/22/12 1304  NA 140 134* 130*  K 4.5 4.1 5.7*  CL 98 94* 91*  CO2 21 23 14*  GLUCOSE 93 246* 324*  BUN 38* 31* 84*  CREATININE 4.09* 3.41* 6.38*  ALB -- -- --  CALCIUM 9.8 9.5 9.7  PHOS 6.7* -- --   CBC  Lab 04/23/12 0830 04/22/12 1304  WBC 5.6 11.9*  NEUTROABS -- --  HGB 10.0* 11.1*  HCT 31.2* 35.3*  MCV 98.4 99.4  PLT PLATELET CLUMPS NOTED ON SMEAR, COUNT APPEARS DECREASED 425*     Medications:      . ARIPiprazole  10 mg Oral Daily  . chlorhexidine      . Chlorhexidine Gluconate Cloth  6 each Topical Q0600  . cinacalcet  30 mg Oral Q supper  . diphenhydrAMINE  25 mg Intravenous On Call  . insulin aspart  0-3 Units Subcutaneous Q4H  . insulin glargine  10 Units Subcutaneous Q24H  . insulin regular  0-10 Units Intravenous TID WC  . methylPREDNISolone (SOLU-MEDROL) injection  40 mg Intravenous On Call  . metoCLOPramide  5 mg Oral TID AC  . mupirocin ointment  1 application Nasal BID  . ofloxacin  1 drop Both Eyes QID  . pantoprazole (PROTONIX) IV  40 mg Intravenous QHS  . PARoxetine  80 mg Oral Daily  . polyvinyl alcohol  1 drop Right Eye Daily  . vancomycin  750 mg Intravenous Q T,Th,Sa-HD  . DISCONTD: methylPREDNISolone (SOLU-MEDROL) injection  32 mg Intravenous Once  .  DISCONTD: SYSTANE LID WIPES  1 application Right Eye Daily     Assessment/ Plan:   1. End stage renal disease/volume overload: HD this morning with UF to help facilitate extubation.  2. Superior vena cava syndrome: Difficult situation in this patient who has had challenging HD access situation- Left sided central circulation seems occluded and not amenable to PTA and Right sided IJ/subclavian seem stenotic at length- challenges of stenting noted. HCPOA to help with decision making regarding aggressiveness of care- may eventually need palliative care if elect not to intervene  3. Hyperkalemia: Corrected with HD, will continue to monitor closely  4. Anemia of chronic kidney disease: Hemoglobin currently at goal, we'll restart ESA when hemoglobin starts trending down.  5. Decubiti/Shallow LE ulcers: On empiric IV vancomycin/analgesics     Zetta Bills, MD 04/24/2012, 7:59 AM

## 2012-04-24 NOTE — Progress Notes (Signed)
NEW CATH WITH SLUGGISH VENOUS PORT, VP UP, LOW BP WITH CONSISTENT NUMBERS ON A-LINE AND BP CUFF. DR. Allena Katz AWARE AND ORDERED TPA BOTH HD CATH PORTS FOR 2 HRS, ALBUMIN TO BE ADMINISTERED DURING HD TX X2 PRN. PT BP NOW IS UP 143/67, WILL RETURN LATER TO FINISH HD TX. 3.75 HRS LEFT AND UF 217 ML.

## 2012-04-24 NOTE — Progress Notes (Signed)
Name: [name] MRN: [id number] DOB: [birth year]    LOS: 2  PCCM ADMISSION NOTE  History of Present Illness: 22 /F ESRD on HD tue/thu/sat, difficult access, last HD 8/22 , recent aortobifemoral bypass surgery ([facility]) presented 8/27 with gradually increasing swelling of face & BUEs, erythematous papular rash over upper extremities &  ears, gen weakness & worse wounds over buttocks.  Required emergent HD for hyperkalemia. PCCM admitting since medically complex H/o contrast allergy Other PMH : DM-2, bipolar, prior MRSA   Per sister, LE & groin wounds have improved after bypass but sacral decubs have worsened & she has new rash , no fevers but feeling unwell to the point of missing HD x 2  Lines / Drains: RIJ permacath  7/22 exchanged 8/2 with SVC angioplasty (IR) exchanged 8/28  Cultures: bld cx 8/27 >>  Antibiotics: Vanc 8/27 >>  Tests / Events: 8/28 angiogram (OR) >>'functional 'SVC occlusion due to HD cath. 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  #2 diffusely narrowed right internal jugular vein a right subclavian vein and superior vena cava to approximately 5 mm lumen  #3 23 cm Diatek catheter with tip in right atrium    SUBJ : mild agitation off sedation this am , off insulin drip overnight  Vital Signs: Temp:  [97.3 F (36.3 C)-98.2 F (36.8 C)] 98.1 F (36.7 C) (08/29 0400) Pulse Rate:  [71-89] 71  (08/29 0700) Resp:  [0-22] 22  (08/29 0700) BP: (123-188)/(52-92) 134/78 mmHg (08/29 0700) SpO2:  [92 %-100 %] 100 % (08/29 0700) Arterial Line BP: (121-224)/(60-92) 134/67 mmHg (08/29 0700) FiO2 (%):  [39.6 %-100 %] 40.1 % (08/29 0700) Weight:  [194 lb 14.2 oz (88.4 kg)] 194 lb 14.2 oz (88.4 kg) (08/29 0400) I/O last 3 completed shifts: In: 2176.9 [P.O.:400; I.V.:1756.9; IV Piggyback:20] Out: 4005 [Urine:175; Other:3830]  Physical Examination: Gen.  , in no distress, normal affect ENT - no lesions, no post nasal drip, swelling over faace &  BUEs Neck: No JVD, no thyromegaly, no carotid bruits, RIJ permacath  Lungs: no use of accessory muscles, no dullness to percussion, clear without rales or rhonchi  Cardiovascular: Rhythm regular, heart sounds  normal, no murmurs, no peripheral edema Abdomen: soft and non-tender, no hepatosplenomegaly, BS normal. Musculoskeletal: No deformities, no cyanosis or clubbing, healed ulcers over BLEs Neuro:  alert, non focal Skin:  Warm,   erythematous papular rash over upper extremities &  Ears, Sacral decub   Vent Mode:  [-] PRVC FiO2 (%):  [39.6 %-100 %] 40.1 % Set Rate:  [18 bmp] 18 bmp Vt Set:  [440 mL] 440 mL PEEP:  [5 cmH20] 5 cmH20 Plateau Pressure:  [19 cmH20-24 cmH20] 20 cmH20  Labs and Imaging:   CBC    Component Value Date/Time   WBC 5.6 04/23/2012 0830   RBC 3.17* 04/23/2012 0830   HGB 10.0* 04/23/2012 0830   HCT 31.2* 04/23/2012 0830   PLT PLATELET CLUMPS NOTED ON SMEAR, COUNT APPEARS DECREASED 04/23/2012 0830   MCV 98.4 04/23/2012 0830   MCH 31.5 04/23/2012 0830   MCHC 32.1 04/23/2012 0830   RDW 18.3* 04/23/2012 0830   LYMPHSABS 1.0 03/07/2012 1419   MONOABS 0.7 03/07/2012 1419   EOSABS 0.2 03/07/2012 1419   BASOSABS 0.1 03/07/2012 1419    BMET    Component Value Date/Time   NA 140 04/23/2012 0830   K 4.5 04/23/2012 0830   CL 98 04/23/2012 0830   CO2 21 04/23/2012 0830   GLUCOSE 93 04/23/2012  0830   BUN 38* 04/23/2012 0830   CREATININE 4.09* 04/23/2012 0830   CREATININE 1.78* 10/17/2008 2000   CALCIUM 9.8 04/23/2012 0830   CALCIUM 8.3* 08/21/2011 0533   GFRNONAA 12* 04/23/2012 0830   GFRAA 14* 04/23/2012 0830    pCXR - 8/28 - RIJ catheter, haze over lt chest ? Effusion vs atx LUL Assessment and Plan:    ASSESSMENT AND PLAN  RENAL   A:  ESRD , last HD 8/22 - missed 2 sessions Hyperkalemia/ volume overload P:   Dialysed emergently 8/27 , rpt planned this am No reaction with contrast - no more medrol doses required  Heme - h/o DVT/ PE , not on coumadin, occlusion of  entire left central venous system  Dc heparin , d/w vascular  PULMONARY  Lab 04/23/12 1655  PHART 7.308*  PCO2ART 48.4*  PO2ART 357.0*  HCO3 24.3*  O2SAT 100.0     A:  Rt effusion, volume overload ? OSA  -undiagnosed  P: Chk cuff leak - wean to extubation to CPAP use CPAP during sleep  CARDIOVASCULAR No results found for this basename: TROPONINI:5,LATICACIDVEN:5, O2SATVEN:5,PROBNP:5 in the last 168 hours ECG:  nSR  A: CAD s/p stents (hochrein) P:  Resume plavix after procedure    GASTROINTESTINAL No results found for this basename: AST:5,ALT:5,ALKPHOS:5,BILITOT:5,PROT:5,ALBUMIN:5 in the last 168 hours  A:  gastroparesis P:   reglan / phenergan prn (GI -  [facility])     INFECTIOUS  Lab 04/23/12 0830 04/22/12 1304  WBC 5.6 11.9*  PROCALCITON -- --     A:  Rash suspicious for mRSA P:   Vanc per pharmacy Await cx data resumed eye drops -D/w Ophthal - she was on topical vanc for 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   ENDOCRINE  Lab 04/24/12 0356 04/24/12 0003 04/23/12 2159 04/23/12 2102 04/23/12 2010  GLUCAP 75 75 87 103* 118*   A:  DM-2  , worsened by solumedrol P:  Transitioned off insulin with lantus, takes 33 units at home   NEUROLOGIC  A:  ENcephalopathy/ lethargy due to benadryl- resolved Bipolar P:  Resume abilify Resume Percocet once off sedation protocol     BEST PRACTICE / DISPOSITION     Best practices / Disposition: -->ICU status under PCCM, can transfer out  If uneventful procedure -->full code -->Heparin for DVT Px -->Protonix for GI Px -->diet -->family updated at bedside  The patient is critically ill with multiple organ systems failure and requires high complexity decision making for assessment and support, frequent evaluation and titration of therapies, application of advanced monitoring technologies and extensive interpretation of multiple databases. Critical Care Time devoted to patient care services described in this note is  40 minutes.  Roni Scow V. 04/24/2012, 7:42 AM  [phone]

## 2012-04-25 ENCOUNTER — Inpatient Hospital Stay (HOSPITAL_COMMUNITY): Payer: Medicare Other

## 2012-04-25 LAB — BLOOD GAS, ARTERIAL
Acid-base deficit: 3.5 mmol/L — ABNORMAL HIGH (ref 0.0–2.0)
Bicarbonate: 22 mEq/L (ref 20.0–24.0)
FIO2: 0.4 %
O2 Saturation: 97.6 %
TCO2: 23.5 mmol/L (ref 0–100)
pO2, Arterial: 94.9 mmHg (ref 80.0–100.0)

## 2012-04-25 LAB — GLUCOSE, CAPILLARY
Glucose-Capillary: 132 mg/dL — ABNORMAL HIGH (ref 70–99)
Glucose-Capillary: 140 mg/dL — ABNORMAL HIGH (ref 70–99)
Glucose-Capillary: 76 mg/dL (ref 70–99)
Glucose-Capillary: 83 mg/dL (ref 70–99)

## 2012-04-25 MED ORDER — ALPRAZOLAM 0.5 MG PO TABS
0.5000 mg | ORAL_TABLET | Freq: Three times a day (TID) | ORAL | Status: DC | PRN
  Administered 2012-04-25: 0.5 mg via ORAL
  Filled 2012-04-25: qty 1

## 2012-04-25 MED ORDER — INSULIN GLARGINE 100 UNIT/ML ~~LOC~~ SOLN
10.0000 [IU] | Freq: Every day | SUBCUTANEOUS | Status: DC
  Administered 2012-04-25: 10 [IU] via SUBCUTANEOUS

## 2012-04-25 MED ORDER — HEPARIN SODIUM (PORCINE) 5000 UNIT/ML IJ SOLN
5000.0000 [IU] | Freq: Three times a day (TID) | INTRAMUSCULAR | Status: DC
  Administered 2012-04-25 – 2012-05-05 (×31): 5000 [IU] via SUBCUTANEOUS
  Filled 2012-04-25 (×35): qty 1

## 2012-04-25 MED ORDER — INSULIN ASPART 100 UNIT/ML ~~LOC~~ SOLN
0.0000 [IU] | SUBCUTANEOUS | Status: DC
  Administered 2012-04-25: 3 [IU] via SUBCUTANEOUS
  Administered 2012-04-25: 4 [IU] via SUBCUTANEOUS
  Administered 2012-04-26: 3 [IU] via SUBCUTANEOUS
  Administered 2012-04-27: 4 [IU] via SUBCUTANEOUS
  Administered 2012-04-27: 3 [IU] via SUBCUTANEOUS
  Administered 2012-04-27: 4 [IU] via SUBCUTANEOUS
  Administered 2012-04-28 (×2): 7 [IU] via SUBCUTANEOUS
  Administered 2012-04-28: 3 [IU] via SUBCUTANEOUS
  Administered 2012-04-28: 7 [IU] via SUBCUTANEOUS
  Administered 2012-04-28: 4 [IU] via SUBCUTANEOUS
  Administered 2012-04-29: 7 [IU] via SUBCUTANEOUS
  Administered 2012-04-29 (×2): 3 [IU] via SUBCUTANEOUS
  Administered 2012-04-29: 15 [IU] via SUBCUTANEOUS
  Administered 2012-04-30 (×2): 7 [IU] via SUBCUTANEOUS
  Administered 2012-04-30 (×2): 4 [IU] via SUBCUTANEOUS
  Administered 2012-04-30: 7 [IU] via SUBCUTANEOUS
  Administered 2012-05-01: 3 [IU] via SUBCUTANEOUS
  Administered 2012-05-01 (×2): 4 [IU] via SUBCUTANEOUS
  Administered 2012-05-01 – 2012-05-02 (×2): 11 [IU] via SUBCUTANEOUS
  Administered 2012-05-02 (×2): 4 [IU] via SUBCUTANEOUS
  Administered 2012-05-02 – 2012-05-03 (×3): 7 [IU] via SUBCUTANEOUS
  Administered 2012-05-03: 4 [IU] via SUBCUTANEOUS
  Administered 2012-05-03: 20 [IU] via SUBCUTANEOUS
  Administered 2012-05-03: 15 [IU] via SUBCUTANEOUS

## 2012-04-25 MED ORDER — VANCOMYCIN HCL IN DEXTROSE 1-5 GM/200ML-% IV SOLN
1000.0000 mg | Freq: Once | INTRAVENOUS | Status: AC
  Administered 2012-04-25: 1000 mg via INTRAVENOUS
  Filled 2012-04-25: qty 200

## 2012-04-25 MED ORDER — SODIUM CHLORIDE 0.9 % IV SOLN: INTRAVENOUS | Status: DC

## 2012-04-25 MED ORDER — LORAZEPAM 2 MG/ML IJ SOLN
0.5000 mg | Freq: Four times a day (QID) | INTRAMUSCULAR | Status: DC | PRN
  Filled 2012-04-25: qty 1

## 2012-04-25 NOTE — Progress Notes (Signed)
Pt does not have a cuff leak.  Dr. Vassie Loll is aware.  We will proceed with extubation per Dr. Vassie Loll to BIPAP.

## 2012-04-25 NOTE — Evaluation (Signed)
Clinical/Bedside Swallow Evaluation Patient Details  Name: [name] MRN: [id number] Date of Birth: 10-27-1970  Today's Date: 04/25/2012 Time: 4098-1191 SLP Time Calculation (min): 14 min  Past Medical History:  Past Medical History  Diagnosis Date  . DVT (deep venous thrombosis)     DVT HISTORY  . Diabetes mellitus   . Migraine     HISTORY  . Anxiety and depression   . CAD (coronary artery disease)     Last cath 2009:  LAD stent 80% stenosis. Circumflex 60-70% stenosis AV groove, right coronary artery 50-60% stenosis. She did have cutting balloon angioplasty of the LAD lesion into a diagonal. However, there was restenosis of this and it was managed medically.  . Bipolar 1 disorder   . Hyperlipidemia   . Hyperparathyroidism   . PE (pulmonary embolism)     HISTORY  . Dialysis patient   . Anemia   . Renal failure     dialysis [place] t,th sat  . Nephrotic syndrome   . Peripheral vascular disease   . MRSA infection   . Anxiety   . Depression   . Gastroparesis   . Complication of anesthesia   . PONV (postoperative nausea and vomiting)     x3 days post anesth. on 12/19/2011  . Myocardial infarction     x3 , last one - 10 yrs. ago  . Hypertension     sees Dr. Antoine Poche - [place], last stress test 11/2011, see result in EPIC, saw Dr. Antoine Poche as well at that time    . Shortness of breath   . Recurrent upper respiratory infection (URI)     Bronchitis 11/2011 - saw Dr. Loney Hering, treating currently /w antibiotic   . GERD (gastroesophageal reflux disease)   . Pneumonia     [facility]- 2012  . Noncompliance of patient with renal dialysis    Past Surgical History:  Past Surgical History  Procedure Date  . Incise and drain abcess     OF THIGHS FROM INSULIN INJECTIONS  . Heart stents   . Portacath placement 2003  . Cataract extraction w/phaco 11/09/2011    Procedure: CATARACT EXTRACTION PHACO AND INTRAOCULAR LENS PLACEMENT (IOC);  Surgeon: Edmon Crape, MD;  Location: [facility] OR;  Service:  Ophthalmology;  Laterality: Right;  . Pars plana vitrectomy 11/09/2011    Procedure: PARS PLANA VITRECTOMY WITH 23 GAUGE;  Surgeon: Edmon Crape, MD;  Location: [facility] OR;  Service: Ophthalmology;  Laterality: Right;  Ahmed Valve; Scleral Reinforcement Graft  . Dg av dialysis  shunt access exist*l* or 12/13/11    left arm  . Dialysis cath inserted & portacath      dialysis catheter inserted & portacath d/c'd- 12/19/2011  . Back surgery X 4    x4 times   . Cholecystectomy   . Eye surgery   . Cardiac catheterization   . Aorta - bilateral femoral artery bypass graft 12/28/2011    Procedure: AORTA BIFEMORAL BYPASS GRAFT;  Surgeon: Nada Libman, MD;  Location: [facility] OR;  Service: Vascular;  Laterality: N/A;  AortoBifemoral Bypass using hemasheild 14x7 graft  . Femoral-popliteal bypass graft 12/28/2011    Procedure: BYPASS GRAFT FEMORAL-POPLITEAL ARTERY;  Surgeon: Nada Libman, MD;  Location: [facility] OR;  Service: Vascular;  Laterality: Right;  right femoral popliteal bypass using 6x80 propaten graft  . Insertion of dialysis catheter 02/15/2012    Procedure: INSERTION OF DIALYSIS CATHETER;  Surgeon: Chuck Hint, MD;  Location: [facility] -  OR;  Service: Vascular;  Laterality: Right;  insertion of dialysis catheter right internal jugular vein,removal of left internal jugular vein dialysis catheter  . Insertion of dialysis catheter 02/21/2012    Procedure: INSERTION OF DIALYSIS CATHETER;  Surgeon: Chuck Hint, MD;  Location: [facility] OR;  Service: Cardiovascular;  Laterality: N/A;  Insertion new dialysis catheter; possible venoplasty of fibrin sheath of  Right IJ catheter  . Venogram 02/21/2012    Procedure: VENOGRAM;  Surgeon: Chuck Hint, MD;  Location: [facility] OR;  Service: Cardiovascular;  Laterality: N/A;  possible venoplasty  . Insertion of dialysis catheter 04/23/2012    Procedure: INSERTION OF DIALYSIS CATHETER;  Surgeon: Sherren Kerns, MD;  Location: [facility] OR;  Service: Vascular;  Laterality: Right;   using a 31fr. x 23 cannon catheter in Right Internal Jugular   HPI:  41 yr old s/p recent aortobifemerol bypass graft, admitted with increased edema in face and BUE's.  Intubated post procedure to protect airway 8/28 and extubated 8/30 a.m.  PMH:  tobacco abuse, ESRD, GERD, PVD, gastroparesis, MI, HTN, pna, URI.  CXR iwth new LLL density, shifting mucous plug, increased aeration in left lung.   Assessment / Plan / Recommendation Clinical Impression  Pt. presented with subtle s/s aspiration with mild delayed cough after 1st ice chip and delayed throat clear with one out of 6 cup sips thin water.  Consumed puree texture without apparent difficulty.  She is at mildly higher aspiration risk due to possible pharyngeal edema after extubation this a.m. (reports sore throat).  Recommend she initiate a Dys 1 diet with thin liquids, small sips, no straws, sitting upright.  ST will continue to follow for safety and appropriateness of diet and upgrade from Dys 1 when endurance increases and denture cream present.         Aspiration Risk  Mild    Diet Recommendation Dysphagia 1 (Puree);Thin liquid   Liquid Administration via: No straw;Cup Medication Administration: Whole meds with puree Supervision: Full supervision/cueing for compensatory strategies;Patient able to self feed Compensations: Slow rate;Small sips/bites Postural Changes and/or Swallow Maneuvers: Seated upright 90 degrees;Upright 30-60 min after meal    Other  Recommendations Oral Care Recommendations: Oral care BID   Follow Up Recommendations  None    Frequency and Duration min 2x/week  2 weeks        SLP Swallow Goals Patient will consume recommended diet without observed clinical signs of aspiration with: Modified independent assistance Patient will utilize recommended strategies during swallow to increase swallowing safety with: Modified independent assistance   Swallow Study Prior Functional Status            Oral/Motor/Sensory Function Overall Oral Motor/Sensory Function: Appears within functional limits for tasks assessed   Ice Chips Ice chips: Impaired Presentation: Spoon Oral Phase Impairments:  (mild decreased manipulation) Oral Phase Functional Implications: Prolonged oral transit Pharyngeal Phase Impairments: Decreased hyoid-laryngeal movement;Cough - Delayed   Thin Liquid Thin Liquid: Impaired Presentation: Cup;Spoon Pharyngeal  Phase Impairments: Throat Clearing - Delayed    Nectar Thick Nectar Thick Liquid: Not tested   Honey Thick Honey Thick Liquid: Not tested   Puree Puree: Within functional limits   Solid       Solid: Not tested       Royce Macadamia M.Ed [employer] [phone]  04/25/2012

## 2012-04-25 NOTE — Progress Notes (Signed)
Name: [name] MRN: [id number] DOB: 05-01-1971    LOS: 3  PCCM  NOTE  History of Present Illness: 40 /F ESRD on HD tue/thu/sat, difficult access, last HD 8/22 , recent aortobifemoral bypass surgery ([facility]) presented 8/27 with gradually increasing swelling of face & BUEs, erythematous papular rash over upper extremities &  ears, gen weakness & worse wounds over buttocks.  Required emergent HD for hyperkalemia. PCCM admitting since medically complex H/o contrast allergy Other PMH : DM-2, bipolar, prior MRSA   Per sister, LE & groin wounds have improved after bypass but sacral decubs have worsened & she has new rash , no fevers but feeling unwell to the point of missing HD x 2  Lines / Drains: RIJ permacath  7/22 exchanged 8/2 with SVC angioplasty (IR) exchanged 8/28  Cultures: bld cx 8/27 >>ng  Antibiotics: Vanc 8/27 >>  Tests / Events: 8/28 angiogram (OR) >>'functional 'SVC occlusion due to HD cath. 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  #2 diffusely narrowed right internal jugular vein a right subclavian vein and superior vena cava to approximately 5 mm lumen  #3 23 cm Diatek catheter with tip in right atrium    SUBJ : mild agitation off sedation this am , sugars better, afebrile  Vital Signs: Temp:  [97.7 F (36.5 C)-99 F (37.2 C)] 97.8 F (36.6 C) (08/30 1152) Pulse Rate:  [82-109] 105  (08/30 1300) Resp:  [1-24] 5  (08/30 1300) BP: (92-167)/(44-75) 137/64 mmHg (08/30 0700) SpO2:  [86 %-100 %] 100 % (08/30 1300) Arterial Line BP: (101-186)/(50-81) 181/74 mmHg (08/30 1300) FiO2 (%):  [39.5 %-99.7 %] 40.5 % (08/30 1300) Weight:  [205 lb 14.6 oz (93.4 kg)] 205 lb 14.6 oz (93.4 kg) (08/30 0600) I/O last 3 completed shifts: In: 1748 [I.V.:1726; IV Piggyback:22] Out: 3088 [Other:3088]  Physical Examination: Gen.  , in no distress, normal affect ENT - no lesions, no post nasal drip, swelling over face & BUEs Neck: No JVD, no thyromegaly, no carotid  bruits, RIJ permacath  Lungs: no use of accessory muscles, no dullness to percussion, clear without rales or rhonchi  Cardiovascular: Rhythm regular, heart sounds  normal, no murmurs, no peripheral edema Abdomen: soft and non-tender, no hepatosplenomegaly, BS normal. Musculoskeletal: No deformities, no cyanosis or clubbing, healed ulcers over BLEs Neuro:  alert, non focal Skin:  Warm,   erythematous papular rash over upper extremities &  Ears, Sacral decub   Vent Mode:  [-] CPAP FiO2 (%):  [39.5 %-99.7 %] 40.5 % Set Rate:  [18 bmp] 18 bmp Vt Set:  [440 mL] 440 mL PEEP:  [5 cmH20] 5 cmH20 Pressure Support:  [5 cmH20] 5 cmH20 Plateau Pressure:  [9 cmH20-19 cmH20] 9 cmH20  Labs and Imaging:   CBC    Component Value Date/Time   WBC 9.3 04/24/2012 0756   RBC 3.21* 04/24/2012 0756   HGB 9.9* 04/24/2012 0756   HCT 31.4* 04/24/2012 0756   PLT 340 04/24/2012 0756   MCV 97.8 04/24/2012 0756   MCH 30.8 04/24/2012 0756   MCHC 31.5 04/24/2012 0756   RDW 18.4* 04/24/2012 0756   LYMPHSABS 1.0 03/07/2012 1419   MONOABS 0.7 03/07/2012 1419   EOSABS 0.2 03/07/2012 1419   BASOSABS 0.1 03/07/2012 1419    BMET    Component Value Date/Time   NA 136 04/24/2012 0756   K 4.0 04/24/2012 0756   CL 100 04/24/2012 0756   CO2 19 04/24/2012 0756   GLUCOSE 82 04/24/2012 0756  BUN 43* 04/24/2012 0756   CREATININE 4.58* 04/24/2012 0756   CREATININE 1.78* 10/17/2008 2000   CALCIUM 9.0 04/24/2012 0756   CALCIUM 8.3* 08/21/2011 0533   GFRNONAA 11* 04/24/2012 0756   GFRAA 13* 04/24/2012 0756    pCXR - 8/28 - RIJ catheter, haze over lt chest ? Effusion vs atx LUL Assessment and Plan:    ASSESSMENT AND PLAN  RENAL   A:  ESRD , last HD 8/22 - missed 2 sessions Hyperkalemia/ volume overload Contrast allergy Difficult access P:   Dialysed emergently 8/27 & again 8/30    Heme - h/o DVT/ PE , not on coumadin, occlusion of entire left central venous system  Dc heparin , d/w vascular  PULMONARY  Lab 04/25/12  0827 04/24/12 0820 04/23/12 1655  PHART 7.288* 7.310* 7.308*  PCO2ART 47.6* 48.2* 48.4*  PO2ART 94.9 70.0* 357.0*  HCO3 22.0 24.3* 24.3*  O2SAT 97.6 92.0 100.0     A:  Rt effusion, volume overload ? OSA  -undiagnosed  P: Extubated to bipap  use CPAP during sleep  CARDIOVASCULAR No results found for this basename: TROPONINI:5,LATICACIDVEN:5, O2SATVEN:5,PROBNP:5 in the last 168 hours ECG:  nSR  A: CAD s/p stents (hochrein) P:  Resumed plavix     GASTROINTESTINAL No results found for this basename: AST:5,ALT:5,ALKPHOS:5,BILITOT:5,PROT:5,ALBUMIN:5 in the last 168 hours  A:  gastroparesis P:   reglan / phenergan prn (GI -  [facility])     INFECTIOUS  Lab 04/24/12 0756 04/23/12 0830 04/22/12 1304  WBC 9.3 5.6 11.9*  PROCALCITON -- -- --     A:  Rash suspicious for mRSA P:   Vanc per pharmacy - ct x 7 ds total given wounds & rash resumed eye drops -D/w Ophthal - she was on topical vanc for 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   ENDOCRINE  Lab 04/25/12 1150 04/25/12 0743 04/25/12 0350 04/24/12 2345 04/24/12 2021  GLUCAP 140* 92 76 83 81   A:  DM-2  , worsened by solumedrol P:  Transitioned off insulin with lantus, takes 33 units at home - increase as PO intake increases   NEUROLOGIC  A:  ENcephalopathy/ lethargy due to benadryl- resolved Bipolar P:  Resume abilify, xanax prn Resume Percocet once off sedation protocol     BEST PRACTICE / DISPOSITION  -->ICU status under PCCM, can transfer out  If uneventful 24h  -->full code -->Heparin for DVT Px -->Protonix for GI Px -->diet -->family updated at bedside  The patient is critically ill with multiple organ systems failure and requires high complexity decision making for assessment and support, frequent evaluation and titration of therapies, application of advanced monitoring technologies and extensive interpretation of multiple databases. Critical Care Time devoted to patient care services described in this note  is 40 minutes.  Leomar Westberg V. 04/25/2012, 3:45 PM  [phone]

## 2012-04-25 NOTE — Progress Notes (Signed)
Patient ID: [name], female   DOB: Nov 09, 1970, 41 y.o.   MRN: [id number]   [facility] Progress Note    Subjective:   Inquires about extubation (pointing to ETT) Delays in HD yesterday noted for multiple reasons.   Objective:   BP 137/64  Pulse 97  Temp 97.8 F (36.6 C) (Oral)  Resp 18  Ht 5\' 2"  (1.575 m)  Wt 93.4 kg (205 lb 14.6 oz)  BMI 37.66 kg/m2  SpO2 100%  LMP 09/03/2008  Physical Exam: MVH:QIONGEXBMWU on vent XLK:GMWNU regular tachycardia, normal S1 and S2  Resp:Coarse BS bilaterally, no rales UVO:ZDGU, obese, NT, BS normal Ext:1-2+ edema over LEs and UEs  Labs: BMET  Lab 04/24/12 0756 04/23/12 0830 04/22/12 2312 04/22/12 1304  NA 136 140 134* 130*  K 4.0 4.5 4.1 5.7*  CL 100 98 94* 91*  CO2 19 21 23  14*  GLUCOSE 82 93 246* 324*  BUN 43* 38* 31* 84*  CREATININE 4.58* 4.09* 3.41* 6.38*  ALB -- -- -- --  CALCIUM 9.0 9.8 9.5 9.7  PHOS -- 6.7* -- --   CBC  Lab 04/24/12 0756 04/23/12 0830 04/22/12 1304  WBC 9.3 5.6 11.9*  NEUTROABS -- -- --  HGB 9.9* 10.0* 11.1*  HCT 31.4* 31.2* 35.3*  MCV 97.8 98.4 99.4  PLT 340 PLATELET CLUMPS NOTED ON SMEAR, COUNT APPEARS DECREASED 425*    @IMGRELPRIORS @ Medications:      . albumin human  12.5 g Intravenous Once  . alteplase  2 mg Intracatheter Once  . alteplase  4 mg Intracatheter Once  . ARIPiprazole  10 mg Oral Daily  . chlorhexidine      . Chlorhexidine Gluconate Cloth  6 each Topical Q0600  . cinacalcet  30 mg Oral Q supper  . heparin subcutaneous  5,000 Units Subcutaneous Q8H  . insulin aspart  0-3 Units Subcutaneous Q4H  . insulin glargine  10 Units Subcutaneous Q24H  . insulin regular  0-10 Units Intravenous TID WC  . metoCLOPramide  5 mg Oral TID AC  . mupirocin ointment  1 application Nasal BID  . NONFORMULARY OR COMPOUNDED ITEM 1 drop  1 drop Ophthalmic Q4H  . ofloxacin  1 drop Both Eyes QID  . pantoprazole (PROTONIX) IV  40 mg Intravenous QHS  . PARoxetine  80 mg Oral  Daily  . polyvinyl alcohol  1 drop Right Eye Daily  . vancomycin  1,000 mg Intravenous Q T,Th,Sa-HD  . DISCONTD: alteplase (TPA-ACTIVASE) *DIALYSIS CATH* 5 mg infusion  5 mg Intravenous Once  . DISCONTD: NON FORMULARY 1 drop  1 drop Ophthalmic Q4H  . DISCONTD: vancomycin  750 mg Intravenous Q T,Th,Sa-HD     Assessment/ Plan:   1. End stage renal disease/volume overload: HD finally done eariler this morning at about 2 AM with UF to help facilitate extubation.- plan for repeat HD tomorrow----will repeat today for 3-4hr if not able to extubate  2. Superior vena cava syndrome: Difficult situation in this patient who has had challenging HD access situation- Left sided central circulation seems occluded and not amenable to PTA and Right sided IJ/subclavian seem stenotic at length- challenges of stenting noted. HCPOA to help with decision making regarding aggressiveness of care- may eventually need palliative care if elect not to intervene  3. Hyperkalemia: Corrected with HD, will continue to monitor closely  4. Anemia of chronic kidney disease: Hemoglobin currently at goal, we'll restart ESA when hemoglobin starts trending down.  5. Decubiti/Shallow LE ulcers: On empiric  IV vancomycin/analgesics     Zetta Bills, MD 04/25/2012, 7:41 AM

## 2012-04-25 NOTE — Procedures (Signed)
Extubation Procedure Note  Patient Details:   Name: [name] DOB: 1970-11-22 MRN: [id number]   Airway Documentation:  Airway 7.5 mm (Active)  Secured at (cm) 25 cm 04/25/2012  8:01 AM  Measured From Lips 04/25/2012  8:01 AM  Secured Location Center 04/25/2012  8:01 AM  Secured By [employer] 04/25/2012  8:01 AM  Tube Holder Repositioned Yes 04/25/2012  8:01 AM  Cuff Pressure (cm H2O) 24 cm H2O 04/24/2012  7:45 AM  Site Condition Dry 04/25/2012  3:17 AM    Evaluation  O2 sats: stable throughout and currently acceptable Complications: No apparent complications Patient did tolerate procedure well. Bilateral Breath Sounds: Rhonchi Suctioning: Airway Yes  No cuff leak noted.  Dr. Vassie Loll aware.    Antoine Poche 04/25/2012, 9:29 AM

## 2012-04-26 ENCOUNTER — Inpatient Hospital Stay (HOSPITAL_COMMUNITY): Payer: Medicare Other

## 2012-04-26 DIAGNOSIS — I82409 Acute embolism and thrombosis of unspecified deep veins of unspecified lower extremity: Secondary | ICD-10-CM

## 2012-04-26 DIAGNOSIS — R4182 Altered mental status, unspecified: Secondary | ICD-10-CM

## 2012-04-26 DIAGNOSIS — I70219 Atherosclerosis of native arteries of extremities with intermittent claudication, unspecified extremity: Secondary | ICD-10-CM

## 2012-04-26 LAB — RENAL FUNCTION PANEL
Albumin: 2.5 g/dL — ABNORMAL LOW (ref 3.5–5.2)
BUN: 29 mg/dL — ABNORMAL HIGH (ref 6–23)
CO2: 22 meq/L (ref 19–32)
Calcium: 10.2 mg/dL (ref 8.4–10.5)
Chloride: 95 meq/L — ABNORMAL LOW (ref 96–112)
Creatinine, Ser: 4.23 mg/dL — ABNORMAL HIGH (ref 0.50–1.10)
GFR calc Af Amer: 14 mL/min — ABNORMAL LOW
GFR calc non Af Amer: 12 mL/min — ABNORMAL LOW
Glucose, Bld: 120 mg/dL — ABNORMAL HIGH (ref 70–99)
Phosphorus: 9.4 mg/dL — ABNORMAL HIGH (ref 2.3–4.6)
Potassium: 4 meq/L (ref 3.5–5.1)
Sodium: 136 meq/L (ref 135–145)

## 2012-04-26 LAB — GLUCOSE, CAPILLARY
Glucose-Capillary: 103 mg/dL — ABNORMAL HIGH (ref 70–99)
Glucose-Capillary: 103 mg/dL — ABNORMAL HIGH (ref 70–99)
Glucose-Capillary: 103 mg/dL — ABNORMAL HIGH (ref 70–99)
Glucose-Capillary: 81 mg/dL (ref 70–99)

## 2012-04-26 LAB — CBC
HCT: 33.4 % — ABNORMAL LOW (ref 36.0–46.0)
Hemoglobin: 10.3 g/dL — ABNORMAL LOW (ref 12.0–15.0)
MCH: 30.8 pg (ref 26.0–34.0)
MCHC: 30.8 g/dL (ref 30.0–36.0)
MCV: 100 fL (ref 78.0–100.0)
Platelets: 226 K/uL (ref 150–400)
RBC: 3.34 MIL/uL — ABNORMAL LOW (ref 3.87–5.11)
RDW: 17.8 % — ABNORMAL HIGH (ref 11.5–15.5)
WBC: 9.7 K/uL (ref 4.0–10.5)

## 2012-04-26 MED ORDER — INSULIN GLARGINE 100 UNIT/ML ~~LOC~~ SOLN
5.0000 [IU] | Freq: Every day | SUBCUTANEOUS | Status: DC
  Administered 2012-04-26 – 2012-05-04 (×9): 5 [IU] via SUBCUTANEOUS

## 2012-04-26 MED ORDER — ALPRAZOLAM 0.25 MG PO TABS
0.2500 mg | ORAL_TABLET | Freq: Three times a day (TID) | ORAL | Status: DC | PRN
  Administered 2012-04-27 (×2): 0.25 mg via ORAL
  Filled 2012-04-26 (×2): qty 1

## 2012-04-26 MED ORDER — ONDANSETRON HCL 4 MG/2ML IJ SOLN
INTRAMUSCULAR | Status: AC
  Administered 2012-04-26: 4 mg via INTRAVENOUS
  Filled 2012-04-26: qty 2

## 2012-04-26 NOTE — Progress Notes (Signed)
Spoke to patient's sister, will have goals of care discussion  Tomorrow 04/27/12 at 11 am.

## 2012-04-26 NOTE — Progress Notes (Signed)
Patient ID: [name], female   DOB: 02-23-1971, 41 y.o.   MRN: [id number]   [facility] Progress Note    Subjective:   Reports headache and back pain (over decubitus) overnight- relief from percocet. Is favoring not to have any stenting/PTA done to stenotic central veins with risks given.   Objective:   BP 129/57  Pulse 98  Temp 97.9 F (36.6 C) (Oral)  Resp 16  Ht 5\' 2"  (1.575 m)  Wt 93.4 kg (205 lb 14.6 oz)  BMI 37.66 kg/m2  SpO2 96%  LMP 09/03/2008  Physical Exam: NWG:NFAOZHYQMVH sleeping in bed- easy to engage in conversation QIO:NGEXB RRR, normal S1 and S2  Resp:Coarse BS bilaterally, no rales/rhonchi MWU:XLKG, obese, NT, BS normal MWN:UUVOZ LE edema, 2+ UE edema  Labs: BMET  Lab 04/24/12 0756 04/23/12 0830 04/22/12 2312 04/22/12 1304  NA 136 140 134* 130*  K 4.0 4.5 4.1 5.7*  CL 100 98 94* 91*  CO2 19 21 23  14*  GLUCOSE 82 93 246* 324*  BUN 43* 38* 31* 84*  CREATININE 4.58* 4.09* 3.41* 6.38*  ALB -- -- -- --  CALCIUM 9.0 9.8 9.5 9.7  PHOS -- 6.7* -- --   CBC  Lab 04/24/12 0756 04/23/12 0830 04/22/12 1304  WBC 9.3 5.6 11.9*  NEUTROABS -- -- --  HGB 9.9* 10.0* 11.1*  HCT 31.4* 31.2* 35.3*  MCV 97.8 98.4 99.4  PLT 340 PLATELET CLUMPS NOTED ON SMEAR, COUNT APPEARS DECREASED 425*    Medications:      . alteplase  2 mg Intracatheter Once  . ARIPiprazole  10 mg Oral Daily  . Chlorhexidine Gluconate Cloth  6 each Topical Q0600  . cinacalcet  30 mg Oral Q supper  . heparin subcutaneous  5,000 Units Subcutaneous Q8H  . insulin aspart  0-20 Units Subcutaneous Q4H  . insulin glargine  10 Units Subcutaneous QHS  . metoCLOPramide  5 mg Oral TID AC  . mupirocin ointment  1 application Nasal BID  . NONFORMULARY OR COMPOUNDED ITEM 1 drop  1 drop Ophthalmic Q4H  . ofloxacin  1 drop Both Eyes QID  . PARoxetine  80 mg Oral Daily  . polyvinyl alcohol  1 drop Right Eye Daily  . vancomycin  1,000 mg Intravenous Q T,Th,Sa-HD  . vancomycin   1,000 mg Intravenous Once  . DISCONTD: insulin aspart  0-3 Units Subcutaneous Q4H  . DISCONTD: insulin glargine  10 Units Subcutaneous Q24H  . DISCONTD: insulin regular  0-10 Units Intravenous TID WC  . DISCONTD: pantoprazole (PROTONIX) IV  40 mg Intravenous QHS     Assessment/ Plan:   1. End stage renal disease/volume overload: HD today for UF/Clearance- UF challenging with "soft" BP. Will likely need daily HD for fluid management  2. Superior vena cava syndrome: Difficult situation in this patient who has had challenging HD access situation- Left sided central circulation seems occluded and not amenable to PTA and Right sided IJ/subclavian seem stenotic at length- seems to be favoring no intervention due to re-occlusion risk- may need palliative care if the final decision is not to intervene  3. Hyperkalemia: Corrected with HD, will continue to monitor closely  4. Anemia of chronic kidney disease: Hemoglobin currently at goal, we'll restart ESA when hemoglobin starts trending down.  5. Decubiti/Shallow LE ulcers: On empiric IV vancomycin/analgesics     Zetta Bills, MD 04/26/2012, 8:01 AM

## 2012-04-26 NOTE — Progress Notes (Signed)
Name: [name] MRN: [id number] DOB: 17-Oct-1970    LOS: 4  PCCM  NOTE  History of Present Illness: 67 /F ESRD on HD tue/thu/sat, difficult access, last HD 8/22 , recent aortobifemoral bypass surgery ([facility]) presented 8/27 with gradually increasing swelling of face & BUEs, erythematous papular rash over upper extremities &  ears, gen weakness & worse wounds over buttocks.  Required emergent HD for hyperkalemia. PCCM admitting since medically complex H/o contrast allergy Other PMH : DM-2, bipolar, prior MRSA  Per sister, LE & groin wounds have improved after bypass but sacral decubs have worsened & she has new rash , no fevers but feeling unwell to the point of missing HD x 2  Lines / Drains: RIJ permacath  7/22 exchanged 8/2 with SVC angioplasty (IR) exchanged 8/28  Cultures: bld cx 8/27 >>ng  Antibiotics: Vanc 8/27 >>  Tests / Events: 8/28 angiogram (OR) >>'functional 'SVC occlusion due to HD cath. 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 In hd 6700  Vital Signs: Temp:  [97.5 F (36.4 C)-98.3 F (36.8 C)] 98.1 F (36.7 C) (08/31 0745) Pulse Rate:  [98-112] 112  (08/31 1200) Resp:  [5-22] 8  (08/31 1014) BP: (91-181)/(51-81) 97/53 mmHg (08/31 1220) SpO2:  [86 %-100 %] 90 % (08/31 0900) Arterial Line BP: (108-196)/(35-94) 154/69 mmHg (08/31 0900) FiO2 (%):  [40.5 %] 40.5 % (08/30 1400) Weight:  [88.8 kg (195 lb 12.3 oz)-89.4 kg (197 lb 1.5 oz)] 88.8 kg (195 lb 12.3 oz) (08/31 1000) I/O last 3 completed shifts: In: 948.9 [P.O.:270; I.V.:668.9; IV Piggyback:10] Out: 2871 [Other:2871]  Physical Examination: General:in hd, no distress Neuro: awake, appropriate HEENT: perl, catheter clean, dressed, edema PULM: slight exp ronchi anterior CV: s1 s2  RRR GI: obese, soft Extremities: edema No sig rash   Vent Mode:  [-]  FiO2 (%):  [40.5 %] 40.5 %  Labs and Imaging:   CBC    Component Value Date/Time   WBC 9.7 04/26/2012 1020   RBC 3.34* 04/26/2012 1020   HGB 10.3* 04/26/2012 1020   HCT 33.4* 04/26/2012 1020   PLT 226 04/26/2012 1020   MCV 100.0 04/26/2012 1020   MCH 30.8 04/26/2012 1020   MCHC 30.8 04/26/2012 1020   RDW 17.8* 04/26/2012 1020   LYMPHSABS 1.0 03/07/2012 1419   MONOABS 0.7 03/07/2012 1419   EOSABS 0.2 03/07/2012 1419   BASOSABS 0.1 03/07/2012 1419    BMET    Component Value Date/Time   NA 136 04/26/2012 1020   K 4.0 04/26/2012 1020   CL 95* 04/26/2012 1020   CO2 22 04/26/2012 1020   GLUCOSE 120* 04/26/2012 1020   BUN 29* 04/26/2012 1020   CREATININE 4.23* 04/26/2012 1020   CREATININE 1.78* 10/17/2008 2000   CALCIUM 10.2 04/26/2012 1020   CALCIUM 8.3* 08/21/2011 0533   GFRNONAA 12* 04/26/2012 1020   GFRAA 14* 04/26/2012 1020    pCXR - 8/29 -   Improved aeration in the left upper lung. New densities in the  left lower lung. Findings are probably related to shifting  atelectasis and possibly mucous plugging. Overall, there is  improved aeration in the left lung.   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 OSA  -undiagnosed  P: Extubated to bipap No distres , no role BIPAP daytime  use CPAP during sleep Last pcxr reviewed For further  neg balance  CARDIOVASCULAR No results found for this basename: TROPONINI:5,LATICACIDVEN:5, O2SATVEN:5,PROBNP:5 in the last 168 hours ECG:  nSR  A: CAD s/p stents (hochrein) P:  Resumed plavix  Follow BP post 5 liters off today Tele continue  GASTROINTESTINAL  Lab 04/26/12 1020  AST --  ALT --  ALKPHOS --  BILITOT --  PROT --  ALBUMIN 2.5*   A:  gastroparesis P:   reglan / phenergan prn (GI -  [facility]zer) Advance diet after SLP  INFECTIOUS  Lab 04/26/12 1020 04/26/12 0400 04/24/12 0756 04/23/12 0830 04/22/12 1304  WBC 9.7 -- 9.3 5.6 11.9*  PROCALCITON -- 0.65 -- -- --   A:  Rash suspicious for mRSA P:   Vanc per pharmacy - ct x 7 ds total given wounds & rash resumed eye drops -D/w Ophthal - she was on topical vanc for 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 Rash improved Will need to add stop date vancomycin  ENDOCRINE  Lab 04/26/12 0747 04/26/12 0010 04/25/12 1951 04/25/12 1604 04/25/12 1150  GLUCAP 103* 103* 151* 132* 140*   A:  DM-2  , worsened by solumedrol P:  Transitioned off insulin with lantus, takes 33 units at home - increase as PO intake increases Borderline glu, reduce lantus 5  NEUROLOGIC  A:  ENcephalopathy/ lethargy due to benadryl- resolved Bipolar P:  Resume abilify, xanax prn Resume Percocet once off sedation protocol Lower xanax with int lethargy   BEST PRACTICE / DISPOSITION  -->ICU status under PCCM, --> to traid , sdu -->full code -->Heparin for DVT Px -->Protonix for GI Px -->diet -->  PARRETT,TAMMY 04/26/2012, 12:21 PM  230 2526  I have fully examined this patient and agree with above findings.    And edited in full  Mcarthur Rossetti. Tyson Alias, MD, FACP Pgr: [phone] [facility]

## 2012-04-27 DIAGNOSIS — E1142 Type 2 diabetes mellitus with diabetic polyneuropathy: Secondary | ICD-10-CM

## 2012-04-27 DIAGNOSIS — I871 Compression of vein: Principal | ICD-10-CM

## 2012-04-27 DIAGNOSIS — K3184 Gastroparesis: Secondary | ICD-10-CM

## 2012-04-27 DIAGNOSIS — J9600 Acute respiratory failure, unspecified whether with hypoxia or hypercapnia: Secondary | ICD-10-CM

## 2012-04-27 DIAGNOSIS — E1149 Type 2 diabetes mellitus with other diabetic neurological complication: Secondary | ICD-10-CM

## 2012-04-27 LAB — TYPE AND SCREEN
ABO/RH(D): A POS
Unit division: 0

## 2012-04-27 LAB — GLUCOSE, CAPILLARY
Glucose-Capillary: 99 mg/dL (ref 70–99)
Glucose-Capillary: 90 mg/dL (ref 70–99)

## 2012-04-27 LAB — CBC
HCT: 33.4 % — ABNORMAL LOW (ref 36.0–46.0)
MCHC: 30.2 g/dL (ref 30.0–36.0)
RDW: 17.6 % — ABNORMAL HIGH (ref 11.5–15.5)

## 2012-04-27 MED ORDER — ALPRAZOLAM 0.5 MG PO TABS
0.5000 mg | ORAL_TABLET | Freq: Three times a day (TID) | ORAL | Status: DC
  Administered 2012-04-27 – 2012-05-01 (×10): 0.5 mg via ORAL
  Filled 2012-04-27 (×10): qty 1

## 2012-04-27 MED ORDER — HEPARIN SODIUM (PORCINE) 1000 UNIT/ML DIALYSIS
40.0000 [IU]/kg | INTRAMUSCULAR | Status: DC | PRN
  Filled 2012-04-27: qty 4

## 2012-04-27 MED ORDER — OXYCODONE-ACETAMINOPHEN 5-325 MG PO TABS
1.0000 | ORAL_TABLET | ORAL | Status: DC | PRN
Start: 2012-04-27 — End: 2012-05-05
  Administered 2012-04-27 – 2012-04-28 (×3): 2 via ORAL
  Administered 2012-04-28 (×3): 1 via ORAL
  Administered 2012-04-28 – 2012-05-01 (×14): 2 via ORAL
  Administered 2012-05-01: 1 via ORAL
  Administered 2012-05-01 – 2012-05-02 (×5): 2 via ORAL
  Administered 2012-05-02: 1 via ORAL
  Administered 2012-05-02 – 2012-05-04 (×14): 2 via ORAL
  Administered 2012-05-05: 1 via ORAL
  Administered 2012-05-05 (×2): 2 via ORAL
  Filled 2012-04-27: qty 2
  Filled 2012-04-27: qty 1
  Filled 2012-04-27 (×9): qty 2
  Filled 2012-04-27: qty 1
  Filled 2012-04-27 (×30): qty 2

## 2012-04-27 NOTE — Progress Notes (Addendum)
ANTIBIOTIC CONSULT NOTE - FOLLOW UP  Pharmacy Consult for Vancomycin Indication: possible MRSA buttocks infection  Allergies  Allergen Reactions  . Compazine (Prochlorperazine) Swelling    Per echart records  . Contrast Media (Iodinated Diagnostic Agents) Swelling    Per echart records  . Meperidine Hcl Swelling  . Metformin And Related Other (See Comments)    'just not myself, was told not to take metformin'  . Morphine Swelling  . Nubain (Nalbuphine Hcl) Swelling  . Tobramycin   . Topiramate (Topamax) Swelling    Tongue swelling - per echart records  . Toradol (Ketorolac Tromethamine) Swelling   Patient Measurements: Height: 5\' 2"  (157.5 cm) Weight: 196 lb 10.4 oz (89.2 kg) IBW/kg (Calculated) : 50.1   Vital Signs: Temp: 97.4 F (36.3 C) (09/01 0349) Temp src: Oral (09/01 0349) BP: 153/67 mmHg (09/01 0349) Pulse Rate: 112  (09/01 0359) Intake/Output from previous day: 08/31 0701 - 09/01 0700 In: 30 [P.O.:30] Out: 3720 [Emesis/NG output:1] Intake/Output from this shift:    Labs:  Basename 04/27/12 0525 04/26/12 1020 04/24/12 0756  WBC 7.8 9.7 9.3  HGB 10.1* 10.3* 9.9*  PLT 215 226 340  LABCREA -- -- --  CREATININE -- 4.23* 4.58*   Estimated Creatinine Clearance: 18 ml/min (by C-G formula based on Cr of 4.23).  Assessment: [name] continues on day #6 vancomycin for buttocks wound concerning for MRSA. She is ESRD on HD TTS receiving a maintenance dose of 1g qHD. Pt had issues with HD on 8/29 -received that dose early 8/30 AM. Vanc 1 gm given w/ HD 8/31 (tolerated 4.5hrs w/ BFR 400 ml/min). Currently Afebrile, WBC wnl.   8/27 Vancomycin >> 8/27 blood cx x2 >>ngtd 8/27 - MRSA screen positive - 5 days CHG/Bactroban Day #2/5  Goal of Therapy:  Vancomycin pre-HD level 15-25  Plan:  1) Continue vancomycin 1g IV qHD 2) Follow up HD plans, levels as necessary  Franchot Erichsen, Pharm.D. Clinical Pharmacist   Pager: [phone] Phone: [phone] 04/27/2012 7:48  AM

## 2012-04-27 NOTE — Progress Notes (Signed)
Speech Language Pathology Dysphagia Treatment Patient Details Name: [name] MRN: [id number] DOB: [birth year] Today's Date: 04/27/2012 Time: 4098-1191 SLP Time Calculation (min): 23 min  Assessment / Plan / Recommendation Clinical Impression  Purpose of diagnostic treatment to follow up for diet tolerance of dysphagia 1 and thin liquids following initial BSE on 04/25/12.  Patient in HD on 04/26/12.  BSE indicates patient presented with mild s/s of aspiration with thin water by cup (delayed throat clear and cough x1 out of 6 cup sips) .  Diet order not initiated per chart review and per RN.    Patient observed this date with thin water by cup sips x2 only as patient refused puree trials secondary to nausea.  Patient stated her back and head hurt to sit in safet 90 degree angle and reclined HOB to 30 degrees herself.    Recommend to initiate recommended diet of dysphagia1 and thin liquids by cup sips only as tolerated with strict adherence to aspiration precautions.  ST to follow in acute care for diet tolerance and to provide skilled education to patient and caregivers on aspiration precautions.         Diet Recommendation  Continue with Current Diet: Dysphagia 1 (puree);Thin liquid    SLP Plan Continue with current plan of care      Swallowing Goals  SLP Swallowing Goals Patient will consume recommended diet without observed clinical signs of aspiration with: Modified independent assistance Swallow Study Goal #1 - Progress: Progressing toward goal Patient will utilize recommended strategies during swallow to increase swallowing safety with: Modified independent assistance Swallow Study Goal #2 - Progress: Progressing toward goal  General Temperature Spikes Noted: No Respiratory Status: Supplemental O2 delivered via (comment) (nasal cannula) Behavior/Cognition: Lethargic;Requires cueing Oral Cavity - Dentition: Edentulous Patient Positioning: Partially reclined  Oral Cavity - Oral  Hygiene Does patient have any of the following "at risk" factors?: Other - dysphagia;Oxygen therapy - cannula, mask, simple oxygen devices Patient is HIGH RISK - Oral Care Protocol followed (see row info): Yes Patient is AT RISK - Oral Care Protocol followed (see row info): Yes   Dysphagia Treatment Treatment focused on: Skilled observation of diet tolerance;Utilization of compensatory strategies;Patient/family/caregiver education Treatment Methods/Modalities: Skilled observation;Differential diagnosis Patient observed directly with PO's: Yes Type of PO's observed: Thin liquids Feeding: Needs assist Liquids provided via: Cup Type of cueing: Verbal Amount of cueing: Minimal       Moreen Fowler MS, CCC-SLP [phone] [facility] 04/27/2012, 10:05 AM

## 2012-04-27 NOTE — Progress Notes (Signed)
Patient ID: [name], female   DOB: 08-22-1971, 41 y.o.   MRN: [id number]   [facility] Progress Note    Subjective:   Reports to be feeling fair and inquires about going home- appears not to grasp the gravity of her situation with regards to HD access limitations   Objective:   BP 153/67  Pulse 112  Temp 97.4 F (36.3 C) (Oral)  Resp 9  Ht 5\' 2"  (1.575 m)  Wt 89.2 kg (196 lb 10.4 oz)  BMI 35.97 kg/m2  SpO2 93%  LMP 09/03/2008  Physical Exam: HQI:[id number] sleeping in bed- obvious asymmetric facial swelling KGM:WNUUV RRR, normal S1 and S2 Resp:Coarse BS bilaterally, expiratory wheeze audible OZD:GUYQ, obese, NT, BS normal IHK:VQQVZ LE edema, 1-2+ UE/facial edema persists  Labs: BMET  Lab 04/26/12 1020 04/24/12 0756 04/23/12 0830 04/22/12 2312 04/22/12 1304  NA 136 136 140 134* 130*  K 4.0 4.0 4.5 4.1 5.7*  CL 95* 100 98 94* 91*  CO2 22 19 21 23  14*  GLUCOSE 120* 82 93 246* 324*  BUN 29* 43* 38* 31* 84*  CREATININE 4.23* 4.58* 4.09* 3.41* 6.38*  ALB -- -- -- -- --  CALCIUM 10.2 9.0 9.8 9.5 9.7  PHOS 9.4* -- 6.7* -- --   CBC  Lab 04/27/12 0525 04/26/12 1020 04/24/12 0756 04/23/12 0830  WBC 7.8 9.7 9.3 5.6  NEUTROABS -- -- -- --  HGB 10.1* 10.3* 9.9* 10.0*  HCT 33.4* 33.4* 31.4* 31.2*  MCV 101.2* 100.0 97.8 98.4  PLT 215 226 340 PLATELET CLUMPS NOTED ON SMEAR, COUNT APPEARS DECREASED     Medications:      . alteplase  2 mg Intracatheter Once  . ARIPiprazole  10 mg Oral Daily  . Chlorhexidine Gluconate Cloth  6 each Topical Q0600  . cinacalcet  30 mg Oral Q supper  . heparin subcutaneous  5,000 Units Subcutaneous Q8H  . insulin aspart  0-20 Units Subcutaneous Q4H  . insulin glargine  5 Units Subcutaneous QHS  . metoCLOPramide  5 mg Oral TID AC  . mupirocin ointment  1 application Nasal BID  . NONFORMULARY OR COMPOUNDED ITEM 1 drop  1 drop Ophthalmic Q4H  . ofloxacin  1 drop Both Eyes QID  . PARoxetine  80 mg Oral Daily  .  polyvinyl alcohol  1 drop Right Eye Daily  . vancomycin  1,000 mg Intravenous Q T,Th,Sa-HD  . DISCONTD: insulin glargine  10 Units Subcutaneous QHS     Assessment/ Plan:   1. End stage renal disease/volume overload: HD today for UF/Clearance- UF challenging with "soft" BP. Will do daily HD while in hospital for fluid management given challenges of SVC syndrome management  2. Superior vena cava syndrome: Difficult situation in this patient who has had challenging HD access situation- Left sided central circulation seems occluded and not amenable to PTA and Right sided IJ/subclavian seem stenotic at length- Family/patient meeting with palliative care team today to set the goals of care 3. Anemia of chronic kidney disease: Hemoglobin currently at goal, we'll restart ESA when hemoglobin starts trending down.  4. Decubiti/Shallow LE ulcers: On empiric IV vancomycin/analgesics, on MRSA contact precautions     Zetta Bills, MD 04/27/2012, 8:48 AM

## 2012-04-27 NOTE — Progress Notes (Signed)
Name: [name] MRN: [id number] DOB: 1971/01/24    LOS: 5  PCCM  NOTE  History of Present Illness: 70 /F ESRD on HD tue/thu/sat, difficult access, last HD 8/22 , recent aortobifemoral bypass surgery ([facility]) presented 8/27 with gradually increasing swelling of face & BUEs, erythematous papular rash over upper extremities &  ears, gen weakness & worse wounds over buttocks.  Required emergent HD for hyperkalemia. PCCM admitting since medically complex H/o contrast allergy Other PMH : DM-2, bipolar, prior MRSA  Per sister, LE & groin wounds have improved after bypass but sacral decubs have worsened & she has new rash , no fevers but feeling unwell to the point of missing HD x 2  Lines / Drains: RIJ permacath  7/22 exchanged 8/2 with SVC angioplasty (IR) exchanged 8/28  Cultures: bld cx 8/27 >>ng  Antibiotics: Vanc 8/27 >>  Tests / Events: 8/28 angiogram (OR) >>'functional 'SVC occlusion due to HD cath. 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  No increased wob.   Vital Signs: Temp:  [97 F (36.1 C)-98.4 F (36.9 C)] 97.4 F (36.3 C) (09/01 0813) Pulse Rate:  [101-117] 110  (09/01 0810) Resp:  [6-16] 11  (09/01 0810) BP: (85-156)/(40-76) 136/76 mmHg (09/01 0810) SpO2:  [93 %-100 %] 94 % (09/01 0810) Arterial Line BP: (116)/(60) 116/60 mmHg (08/31 1530) Weight:  [84.9 kg (187 lb 2.7 oz)-89.2 kg (196 lb 10.4 oz)] 89.2 kg (196 lb 10.4 oz) (09/01 0349) I/O last 3 completed shifts: In: 270 [P.O.:150; I.V.:120] Out: 3720 [Emesis/NG output:1; Other:3719]  Physical Examination: General:obese female in nad Nose without purulence or discharge noted. Chest with basilar crackles, o/w clear Cor with rrr abd benign LE with edema, no  cyanosis Alert and oriented, moves all 4.     Labs and Imaging:   CBC    Component Value Date/Time   WBC 7.8 04/27/2012 0525   RBC 3.30* 04/27/2012 0525   HGB 10.1* 04/27/2012 0525   HCT 33.4* 04/27/2012 0525   PLT 215 04/27/2012 0525   MCV 101.2* 04/27/2012 0525   MCH 30.6 04/27/2012 0525   MCHC 30.2 04/27/2012 0525   RDW 17.6* 04/27/2012 0525   LYMPHSABS 1.0 03/07/2012 1419   MONOABS 0.7 03/07/2012 1419   EOSABS 0.2 03/07/2012 1419   BASOSABS 0.1 03/07/2012 1419    BMET    Component Value Date/Time   NA 136 04/26/2012 1020   K 4.0 04/26/2012 1020   CL 95* 04/26/2012 1020   CO2 22 04/26/2012 1020   GLUCOSE 120* 04/26/2012 1020   BUN 29* 04/26/2012 1020   CREATININE 4.23* 04/26/2012 1020   CREATININE 1.78* 10/17/2008 2000   CALCIUM 10.2 04/26/2012 1020   CALCIUM 8.3* 08/21/2011 0533   GFRNONAA 12* 04/26/2012 1020   GFRAA 14* 04/26/2012 1020     ASSESSMENT AND PLAN  RENAL   A:  ESRD , last HD 8/22 - missed 2 sessions Hyperkalemia/ volume overload Contrast allergy Difficult access P:   Continue HD per renal   Heme - h/o DVT/ PE , not on coumadin, 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

## 2012-04-27 NOTE — Consult Note (Signed)
[name]      DOB: April 17, 1971      OZH:[id number]     Consult Note from the Palliative Medicine Team at [facility]    Consult Requested by: Londell Moh      PCP: Ernestine Conrad, MD Reason for Consultation: Goals of care     Phone [phone]  Assessment of patients Current state: 41 y/o female with multiple medical problems including ESRD, diabetes mellitus, superior vena cava syndrome who has been gradually getting weaker over past few months and is mostly bed bound at home with her sister and mother helping her at home with almost everything. She wants to continue with hemodialysis, and wants to be resuscitated in case of cardiac arrest.  Goals of Care: 1.  Code Status: Full code   2. Scope of Treatment: 1. Vital Signs: Per protocol  2. Respiratory/Oxygen: Via nasal canula 3. Antibiotics: Continue as needed 4. IVF: Continue as needed 5. Review of Medications to be discontinued: None 6. Labs: continue as needed 7. Telemetry: Continue 8. Hemodialysis; continue   4. Disposition: SNF   3. Symptom Management:   1. Anxiety/Agitation: xanax prn 2. Pain: Will change Percocet to 1-2 tabs po Q 4 hr prn 3. Bowel Regimen: enema, Sorbitol prn,  4. Nausea/Vomiting: Zofran, phenergan prn  4. Psychosocial: [name] lives with her mother and sister, who are her primary care givers. She also gets CNA every day for her personal needs. At this time the sister and mother are experiencing caregiver burnout. Also [name] has been noncompliant with taking her medications at home. [name] also has also not been regular in going for dialysis as outpatient.  5. Spiritual: No spiritual needs at this time        [name] Documents Completed or Given: Document Given Completed  Advanced Directives Pkt    MOST    DNR    Gone from My Sight    Hard Choices      Brief HPI: 41 y/o female with ESRD on hemodialysis three days a week, recent aotobifemoral bypass  surgery, came to hospital with gradually worsening swelling of face and bilateral upper extremities, and was diagnosed with superior vena cava syndrome. [name] was seen by vascular surgery, and found that she has left side central circulation occlusion and not amenable to PTA, and right side Internal Juglar / subclavian are diffusely narrowed. [name] needs to wait for at least six months before putting a graft in the thigh. Goals of care discussed with [name], her sister, brother, sister-in law and mother at bedside. Discussed the code status, difference between comfort care and aggressive care, quality of life issues.  ROS: Anxiety- controlled with xanax Dyspnea- denies Constipation- had BM yesterday Pain- admits to having headaches, controlled with percocet Nausea/vomiting- zofran prn Depression- has not been compliant with her medications over past few weeks at home, says she feels depressed   PMH:  Past Medical History  Diagnosis Date  . DVT (deep venous thrombosis)     DVT HISTORY  . Diabetes mellitus   . Migraine     HISTORY  . Anxiety and depression   . CAD (coronary artery disease)     Last cath 2009:  LAD stent 80% stenosis. Circumflex 60-70% stenosis AV groove, right coronary artery 50-60% stenosis. She did have cutting balloon angioplasty of the LAD lesion into a diagonal. However, there was restenosis of this and it was managed medically.  . Bipolar 1 disorder   . Hyperlipidemia   . Hyperparathyroidism   .  PE (pulmonary embolism)     HISTORY  . Dialysis [name]   . Anemia   . Renal failure     dialysis [place] t,th sat  . Nephrotic syndrome   . Peripheral vascular disease   . MRSA infection   . Anxiety   . Depression   . Gastroparesis   . Complication of anesthesia   . PONV (postoperative nausea and vomiting)     x3 days post anesth. on 12/19/2011  . Myocardial infarction     x3 , last one - 10 yrs. ago  . Hypertension     sees Dr. Antoine Poche - , last  stress test 11/2011, see result in EPIC, saw Dr. Antoine Poche as well at that time    . Shortness of breath   . Recurrent upper respiratory infection (URI)     Bronchitis 11/2011 - saw Dr. Loney Hering, treating currently /w antibiotic   . GERD (gastroesophageal reflux disease)   . Pneumonia     [facility]- 2012  . Noncompliance of [name] with renal dialysis      PSH: Past Surgical History  Procedure Date  . Incise and drain abcess     OF THIGHS FROM INSULIN INJECTIONS  . Heart stents   . Portacath placement 2003  . Cataract extraction w/phaco 11/09/2011    Procedure: CATARACT EXTRACTION PHACO AND INTRAOCULAR LENS PLACEMENT (IOC);  Surgeon: Edmon Crape, MD;  Location[facility] OR;  Service: Ophthalmology;  Laterality: Right;  . Pars plana vitrectomy 11/09/2011    Procedure: PARS PLANA VITRECTOMY WITH 23 GAUGE;  Surgeon: Edmon Crape, MD;  Location: [facility] OR;  Service: Ophthalmology;  Laterality: Right;  Ahmed Valve; Scleral Reinforcement Graft  . Dg av dialysis  shunt access exist*l* or 12/13/11    left arm  . Dialysis cath inserted & portacath      dialysis catheter inserted & portacath d/c'd- 12/19/2011  . Back surgery X 4    x4 times   . Cholecystectomy   . Eye surgery   . Cardiac catheterization   . Aorta - bilateral femoral artery bypass graft 12/28/2011    Procedure: AORTA BIFEMORAL BYPASS GRAFT;  Surgeon: Nada Libman, MD;  Location: [facility] OR;  Service: Vascular;  Laterality: N/A;  AortoBifemoral Bypass using hemasheild 14x7 graft  . Femoral-popliteal bypass graft 12/28/2011    Procedure: BYPASS GRAFT FEMORAL-POPLITEAL ARTERY;  Surgeon: Nada Libman, MD;  Location: [facility] OR;  Service: Vascular;  Laterality: Right;  right femoral popliteal bypass using 6x80 propaten graft  . Insertion of dialysis catheter 02/15/2012    Procedure: INSERTION OF DIALYSIS CATHETER;  Surgeon: Chuck Hint, MD;  Location: [facility] OR;  Service: Vascular;  Laterality: Right;  insertion of dialysis catheter right internal jugular  vein,removal of left internal jugular vein dialysis catheter  . Insertion of dialysis catheter 02/21/2012    Procedure: INSERTION OF DIALYSIS CATHETER;  Surgeon: Chuck Hint, MD;  Location[facility] OR;  Service: Cardiovascular;  Laterality: N/A;  Insertion new dialysis catheter; possible venoplasty of fibrin sheath of  Right IJ catheter  . Venogram 02/21/2012    Procedure: VENOGRAM;  Surgeon: Chuck Hint, MD;  Location: [facility] OR;  Service: Cardiovascular;  Laterality: N/A;  possible venoplasty  . Insertion of dialysis catheter 04/23/2012    Procedure: INSERTION OF DIALYSIS CATHETER;  Surgeon: Sherren Kerns, MD;  Location: [facility] OR;  Service: Vascular;  Laterality: Right;  using a 62fr. x 23 cannon catheter in Right Internal Jugular   I have reviewed the  FH and SH and  If appropriate update it with new information. Allergies  Allergen Reactions  . Compazine (Prochlorperazine) Swelling    Per echart records  . Contrast Media (Iodinated Diagnostic Agents) Swelling    Per echart records  . Meperidine Hcl Swelling  . Metformin And Related Other (See Comments)    'just not myself, was told not to take metformin'  . Morphine Swelling  . Nubain (Nalbuphine Hcl) Swelling  . Tobramycin   . Topiramate (Topamax) Swelling    Tongue swelling - per echart records  . Toradol (Ketorolac Tromethamine) Swelling   Scheduled Meds:   . alteplase  2 mg Intracatheter Once  . ARIPiprazole  10 mg Oral Daily  . Chlorhexidine Gluconate Cloth  6 each Topical Q0600  . cinacalcet  30 mg Oral Q supper  . heparin subcutaneous  5,000 Units Subcutaneous Q8H  . insulin aspart  0-20 Units Subcutaneous Q4H  . insulin glargine  5 Units Subcutaneous QHS  . metoCLOPramide  5 mg Oral TID AC  . mupirocin ointment  1 application Nasal BID  . NONFORMULARY OR COMPOUNDED ITEM 1 drop  1 drop Ophthalmic Q4H  . ofloxacin  1 drop Both Eyes QID  . PARoxetine  80 mg Oral Daily  . polyvinyl alcohol  1 drop Right Eye Daily   . vancomycin  1,000 mg Intravenous Q T,Th,Sa-HD  . DISCONTD: insulin glargine  10 Units Subcutaneous QHS   Continuous Infusions:   . sodium chloride    . sodium chloride 10 mL/hr at 04/26/12 0400   PRN Meds:.sodium chloride, sodium chloride, sodium chloride, acetaminophen, acetaminophen, ALPRAZolam, calcium carbonate (dosed in mg elemental calcium), camphor-menthol, docusate sodium, feeding supplement (NEPRO CARB STEADY), feeding supplement (NEPRO CARB STEADY), heparin, heparin, heparin, hydrOXYzine, lidocaine, lidocaine-prilocaine, ondansetron (ZOFRAN) IV, ondansetron, oxyCODONE-acetaminophen, pentafluoroprop-tetrafluoroeth, promethazine, sorbitol zolpidem, DISCONTD: ALPRAZolam, DISCONTD: oxyCODONE-acetaminophen    BP 136/76  Pulse 110  Temp 97.4 F (36.3 C) (Oral)  Resp 11  Ht 5\' 2"  (1.575 m)  Wt 89.2 kg (196 lb 10.4 oz)  BMI 35.97 kg/m2  SpO2 94%  LMP 09/03/2008   PPS: 50%   Intake/Output Summary (Last 24 hours) at 04/27/12 1235 Last data filed at 04/26/12 1715  Gross per 24 hour  Intake      0 ml  Output   3720 ml  Net  -3720 ml     Physical Exam:  General:  [name] appears lethargic HEENT: NCAT, oral mucosa is pink and moist Chest: Clear bilaterally CVS: S1S2 RRR Abdomen: soft, nontender Ext: No edema Neuro: AO x 3, has decision making capacity  Labs: CBC    Component Value Date/Time   WBC 7.8 04/27/2012 0525   RBC 3.30* 04/27/2012 0525   HGB 10.1* 04/27/2012 0525   HCT 33.4* 04/27/2012 0525   PLT 215 04/27/2012 0525   MCV 101.2* 04/27/2012 0525   MCH 30.6 04/27/2012 0525   MCHC 30.2 04/27/2012 0525   RDW 17.6* 04/27/2012 0525   LYMPHSABS 1.0 03/07/2012 1419   MONOABS 0.7 03/07/2012 1419   EOSABS 0.2 03/07/2012 1419   BASOSABS 0.1 03/07/2012 1419    BMET    Component Value Date/Time   NA 136 04/26/2012 1020   K 4.0 04/26/2012 1020   CL 95* 04/26/2012 1020   CO2 22 04/26/2012 1020   GLUCOSE 120* 04/26/2012 1020   BUN 29* 04/26/2012 1020   CREATININE 4.23* 04/26/2012  1020   CREATININE 1.78* 10/17/2008 2000   CALCIUM 10.2 04/26/2012 1020   CALCIUM 8.3* 08/21/2011  0533   GFRNONAA 12* 04/26/2012 1020   GFRAA 14* 04/26/2012 1020    CMP     Component Value Date/Time   NA 136 04/26/2012 1020   K 4.0 04/26/2012 1020   CL 95* 04/26/2012 1020   CO2 22 04/26/2012 1020   GLUCOSE 120* 04/26/2012 1020   BUN 29* 04/26/2012 1020   CREATININE 4.23* 04/26/2012 1020   CREATININE 1.78* 10/17/2008 2000   CALCIUM 10.2 04/26/2012 1020   CALCIUM 8.3* 08/21/2011 0533   PROT 6.9 03/07/2012 1419   ALBUMIN 2.5* 04/26/2012 1020   AST 11 03/07/2012 1419   ALT 9 03/07/2012 1419   ALKPHOS 191* 03/07/2012 1419   BILITOT 0.1* 03/07/2012 1419   GFRNONAA 12* 04/26/2012 1020   GFRAA 14* 04/26/2012 1020        Time In Time Out Total Time Spent with [name] Total Overall Time  11 am 1 pm 75 min 120 min    Greater than 50%  of this time was spent counseling and coordinating care related to the above assessment and plan.

## 2012-04-28 LAB — GLUCOSE, CAPILLARY
Glucose-Capillary: 121 mg/dL — ABNORMAL HIGH (ref 70–99)
Glucose-Capillary: 147 mg/dL — ABNORMAL HIGH (ref 70–99)
Glucose-Capillary: 244 mg/dL — ABNORMAL HIGH (ref 70–99)
Glucose-Capillary: 238 mg/dL — ABNORMAL HIGH (ref 70–99)
Glucose-Capillary: 229 mg/dL — ABNORMAL HIGH (ref 70–99)

## 2012-04-28 NOTE — Progress Notes (Signed)
[facility] ROUNDING NOTE   Subjective:   Interval History: does not want dialysis again today  Objective:  Vital signs in last 24 hours:  Temp:  [97.1 F (36.2 C)-98.1 F (36.7 C)] 97.7 F (36.5 C) (09/02 0354) Pulse Rate:  [105-113] 108  (09/02 0354) Resp:  [13-22] 13  (09/01 2315) BP: (150-171)/(60-79) 151/74 mmHg (09/02 0354) SpO2:  [85 %-96 %] 85 % (09/02 0354) Weight:  [89.2 kg (196 lb 10.4 oz)] 89.2 kg (196 lb 10.4 oz) (09/02 0354)  Weight change: -0.2 kg (-7.1 oz) Filed Weights   04/26/12 1445 04/27/12 0349 04/28/12 0354  Weight: 84.9 kg (187 lb 2.7 oz) 89.2 kg (196 lb 10.4 oz) 89.2 kg (196 lb 10.4 oz)    Intake/Output: I/O last 3 completed shifts: In: 240 [P.O.:240] Out: 0    Intake/Output this shift:     ZOX:WRUEAVWUJWJ sleeping in bed- obvious asymmetric facial swelling  XBJ:YNWGN RRR, normal S1 and S2  Resp:Coarse BS bilaterally, expiratory wheeze audible  FAO:ZHYQ, obese, NT, BS normal  MVH:QIONG LE edema, 1-2+ UE/facial edema persists    Basic Metabolic Panel:  Lab 04/26/12 2952 04/24/12 0756 04/23/12 0830 04/22/12 2312 04/22/12 1304  NA 136 136 140 134* 130*  K 4.0 4.0 4.5 4.1 5.7*  CL 95* 100 98 94* 91*  CO2 22 19 21 23  14*  GLUCOSE 120* 82 93 246* 324*  BUN 29* 43* 38* 31* 84*  CREATININE 4.23* 4.58* 4.09* 3.41* 6.38*  CALCIUM 10.2 9.0 9.8 -- --  MG -- -- 2.7* -- --  PHOS 9.4* -- 6.7* -- --    Liver Function Tests:  Lab 04/26/12 1020  AST --  ALT --  ALKPHOS --  BILITOT --  PROT --  ALBUMIN 2.5*   No results found for this basename: LIPASE:5,AMYLASE:5 in the last 168 hours No results found for this basename: AMMONIA:3 in the last 168 hours  CBC:  Lab 04/27/12 0525 04/26/12 1020 04/24/12 0756 04/23/12 0830 04/22/12 1304  WBC 7.8 9.7 9.3 5.6 11.9*  NEUTROABS -- -- -- -- --  HGB 10.1* 10.3* 9.9* 10.0* 11.1*  HCT 33.4* 33.4* 31.4* 31.2* 35.3*  MCV 101.2* 100.0 97.8 98.4 99.4  PLT 215 226 340 PLATELET CLUMPS NOTED  ON SMEAR, COUNT APPEARS DECREASED 425*    Cardiac Enzymes: No results found for this basename: CKTOTAL:5,CKMB:5,CKMBINDEX:5,TROPONINI:5 in the last 168 hours  BNP: No components found with this basename: POCBNP:5  CBG:  Lab 04/28/12 0804 04/28/12 0357 04/27/12 2314 04/27/12 2022 04/27/12 1607  GLUCAP 207* 147* 121* 160* 153*    Microbiology: Results for orders placed during the hospital encounter of 04/22/12  CULTURE, BLOOD (ROUTINE X 2)     Status: Normal (Preliminary result)   Collection Time   04/22/12  6:10 PM      Component Value Range Status Comment   Specimen Description BLOOD HEMODIALYSIS CATHETER RIGHT   Final    Special Requests BOTTLES DRAWN AEROBIC AND ANAEROBIC 10CC   Final    Culture  Setup Time 04/23/2012 01:29   Final    Culture     Final    Value:        BLOOD CULTURE RECEIVED NO GROWTH TO DATE CULTURE WILL BE HELD FOR 5 DAYS BEFORE ISSUING A FINAL NEGATIVE REPORT   Report Status PENDING   Incomplete   CULTURE, BLOOD (ROUTINE X 2)     Status: Normal (Preliminary result)   Collection Time   04/22/12  6:15 PM  Component Value Range Status Comment   Specimen Description BLOOD HEMODIALYSIS CATHETER RIGHT   Final    Special Requests BOTTLES DRAWN AEROBIC AND ANAEROBIC 10CC   Final    Culture  Setup Time 04/23/2012 01:29   Final    Culture     Final    Value:        BLOOD CULTURE RECEIVED NO GROWTH TO DATE CULTURE WILL BE HELD FOR 5 DAYS BEFORE ISSUING A FINAL NEGATIVE REPORT   Report Status PENDING   Incomplete   MRSA PCR SCREENING     Status: Abnormal   Collection Time   04/22/12 10:38 PM      Component Value Range Status Comment   MRSA by PCR POSITIVE (*) NEGATIVE Final     Coagulation Studies: No results found for this basename: LABPROT:5,INR:5 in the last 72 hours  Urinalysis: No results found for this basename:  COLORURINE:2,APPERANCEUR:2,LABSPEC:2,PHURINE:2,GLUCOSEU:2,HGBUR:2,BILIRUBINUR:2,KETONESUR:2,PROTEINUR:2,UROBILINOGEN:2,NITRITE:2,LEUKOCYTESUR:2 in the last 72 hours    Imaging: No results found.   Medications:      . sodium chloride    . sodium chloride 10 mL/hr at 04/26/12 0400      . ALPRAZolam  0.5 mg Oral TID  . alteplase  2 mg Intracatheter Once  . ARIPiprazole  10 mg Oral Daily  . cinacalcet  30 mg Oral Q supper  . heparin subcutaneous  5,000 Units Subcutaneous Q8H  . insulin aspart  0-20 Units Subcutaneous Q4H  . insulin glargine  5 Units Subcutaneous QHS  . metoCLOPramide  5 mg Oral TID AC  . mupirocin ointment  1 application Nasal BID  . NONFORMULARY OR COMPOUNDED ITEM 1 drop  1 drop Ophthalmic Q4H  . ofloxacin  1 drop Both Eyes QID  . PARoxetine  80 mg Oral Daily  . polyvinyl alcohol  1 drop Right Eye Daily  . vancomycin  1,000 mg Intravenous Q T,Th,Sa-HD   sodium chloride, sodium chloride, sodium chloride, acetaminophen, acetaminophen, calcium carbonate (dosed in mg elemental calcium), camphor-menthol, docusate sodium, feeding supplement (NEPRO CARB STEADY), feeding supplement (NEPRO CARB STEADY), heparin, heparin, heparin, hydrOXYzine, lidocaine, lidocaine-prilocaine, ondansetron (ZOFRAN) IV, ondansetron, oxyCODONE-acetaminophen, pentafluoroprop-tetrafluoroeth, promethazine, sorbitol, zolpidem DISCONTD: ALPRAZolam, DISCONTD: oxyCODONE-acetaminophen  Assessment/ Plan:  1. End stage renal disease/volume overload: patient refuses dialysis today. Will plan HD in AM 2. Superior vena cava syndrome: Difficult situation in this patient who has had challenging HD access situation- Left sided central circulation seems occluded and not amenable to PTA and Right sided IJ/subclavian seem stenotic at length- Family/patient meeting with palliative care team today to set the goals of care  3. Anemia of chronic kidney disease: Hemoglobin currently at goal, we'll restart ESA when  hemoglobin starts trending down.  4. Decubiti/Shallow LE ulcers: On empiric IV vancomycin/analgesics, on MRSA contact precautions    LOS: 6 Benisha Hadaway W @TODAY @8 :58 AM

## 2012-04-28 NOTE — Progress Notes (Signed)
Speech Language Pathology Dysphagia Treatment Patient Details Name: MAVA SUARES MRN: [id number] DOB: 11-05-1970 Today's Date: 04/28/2012 Time: 6578-4696 SLP Time Calculation (min): 10 min  Assessment / Plan / Recommendation Clinical Impression  Pt. seen for swallow safety with Dys. 1 diet/thin liquids. Delayed cough observed at bedside, highly suspect due to COPD. No other s/s of aspiration present at bedside with thin liquids (w. straw) and cracker. Pt. does not report any difficulty with cracker while using dentures.SLP recommends reg diet with thin liquids (straws okay) with dentures present. NO f/u recommended at this time. Pt. c/o of 1-/10 pain in aboomen with nursing notified.     Diet Recommendation  Continue with Current Diet: Regular;Thin liquid Initiate / Change Diet: Regular;Thin liquid    SLP Plan Discharge SLP treatment due to (comment) (safe swallow observed at bedside)      Swallowing Goals  SLP Swallowing Goals Patient will consume recommended diet without observed clinical signs of aspiration with: Modified independent assistance Swallow Study Goal #1 - Progress: Met Patient will utilize recommended strategies during swallow to increase swallowing safety with: Modified independent assistance Swallow Study Goal #2 - Progress: Met  General Temperature Spikes Noted: No Respiratory Status: Room air Behavior/Cognition: Alert;Cooperative;Pleasant mood Oral Cavity - Dentition: Dentures, top;Dentures, bottom Patient Positioning: Upright in bed  Oral Cavity - Oral Hygiene     Dysphagia Treatment Treatment focused on: Upgraded PO texture trials Treatment Methods/Modalities: Skilled observation Patient observed directly with PO's: Yes Type of PO's observed: Regular;Thin liquids (cracker) Feeding: Able to feed self Liquids provided via: Cup;Straw Pharyngeal Phase Signs & Symptoms: Delayed cough (highly suspect due to COPD) Type of cueing: Verbal (small sips) Amount  of cueing: Minimal   GO     Theotis Burrow 04/28/2012, 10:27 AM

## 2012-04-28 NOTE — Progress Notes (Signed)
[facility] Progress Note [place] TEAM 1 - Stepdown/ICU TEAM   [name] ZOX:[id number] DOB: 02-04-1971 DOA: 04/22/2012 PCP: Ernestine Conrad, MD  Brief narrative: 41yo F ESRD on HD tue/thu/sat, difficult access, last HD 8/22 , recent aortobifemoral bypass surgery ([facility]) presented 8/27 with gradually increasing swelling of face & BUEs, erythematous papular rash over upper extremities & ears, gen weakness & worse wounds over buttocks. Required emergent HD for hyperkalemia. PCCM admitted. H/o contrast allergy  Other PMH : DM-2, bipolar, prior MRSA  Per sister, LE & groin wounds have improved after bypass but sacral decubs have worsened & she has new rash , no fevers but feeling unwell to the point of missing HD x 2.  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 Needs outpt sleep study per PCCM - is NOT on nightly CPAP here, and as per my conversation w/ pt she does not wish to try it at this time   Rash suspicious for MRSA Being tx w/ vancomycin empirically   DM2 CBG variable - will need to follow trend closely  Anemia of chronic kidney disease As per Nephrology  Shallow LE ulcers No change in tx plan at this time  CAD s/p stents (Hochrein) on Plavix  Gastroparesis  Code Status: Full Disposition Plan: will need SNF placement once medically stable (v/s ? LTAC)  Consultants: Nephrology PCCM - signed off 9/01  Procedures: RIJ permacath 7/22 exchanged 8/2 with SVC angioplasty (IR) exchanged 8/28  8/28 angiogram (OR)  >>"functional SVC occlusion" due to HD cath.    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    #2 diffusely narrowed right internal jugular vein, right subclavian vein, and superior vena cava to approximately 5 mm lumen    #3 23 cm Diatek catheter with tip in right atrium   Antibiotics: Vanc 8/27 >>  DVT prophylaxis: Sub Q heparin  HPI/Subjective: At the time of my visit the patient is sitting up beside the bed and appears to be comfortable.  She denies any complaints whatsoever.  Specifically she denies back pain shortness of breath chest pain or abdominal pain.  Immediately after I leave the room however she asks the nurse to give her her pain meds and her benzodiazepine.   Objective: Blood pressure 145/62, pulse 107, temperature 97.8 F (36.6 C), temperature source Oral, resp. rate 17, height 5\' 2"  (1.575 m), weight 89.2 kg (196 lb 10.4 oz), last menstrual period 09/03/2008, SpO2 95.00%.  Intake/Output Summary (Last 24 hours) at 04/28/12 1415 Last data filed at 04/28/12 1030  Gross per 24 hour  Intake    540 ml  Output      0 ml  Net    540 ml     Exam: General: No acute respiratory distress at rest Lungs: Very distant breath sounds throughout all fields with no active Cardiovascular: Very distant heart sounds with no appreciable murmur gallop or rub Abdomen: Morbidly obese, nontender, nondistended, soft, bowel sounds positive, no rebound, no ascites, no appreciable mass Extremities: 2+ bilateral lower extremity chronic appearing edema  Data Reviewed: Basic  Metabolic Panel:  Lab 04/26/12 1610 04/24/12 0756 04/23/12 0830 04/22/12 2312 04/22/12 1304  NA 136 136 140 134* 130*  K 4.0 4.0 4.5 4.1 5.7*  CL 95* 100 98 94* 91*  CO2 22 19 21 23  14*  GLUCOSE 120* 82 93 246* 324*  BUN 29* 43* 38* 31* 84*  CREATININE 4.23* 4.58* 4.09* 3.41* 6.38*  CALCIUM 10.2 9.0 9.8 9.5 9.7  MG -- -- 2.7* -- --  PHOS 9.4* -- 6.7* -- --   Liver Function Tests:  Lab 04/26/12 1020    AST --  ALT --  ALKPHOS --  BILITOT --  PROT --  ALBUMIN 2.5*   CBC:  Lab 04/27/12 0525 04/26/12 1020 04/24/12 0756 04/23/12 0830 04/22/12 1304  WBC 7.8 9.7 9.3 5.6 11.9*  NEUTROABS -- -- -- -- --  HGB 10.1* 10.3* 9.9* 10.0* 11.1*  HCT 33.4* 33.4* 31.4* 31.2* 35.3*  MCV 101.2* 100.0 97.8 98.4 99.4  PLT 215 226 340 PLATELET CLUMPS NOTED ON SMEAR, COUNT APPEARS DECREASED 425*   CBG:  Lab 04/28/12 1200 04/28/12 0804 04/28/12 0357 04/27/12 2314 04/27/12 2022  GLUCAP 195* 207* 147* 121* 160*    Recent Results (from the past 240 hour(s))  CULTURE, BLOOD (ROUTINE X 2)     Status: Normal (Preliminary result)   Collection Time   04/22/12  6:10 PM      Component Value Range Status Comment   Specimen Description BLOOD HEMODIALYSIS CATHETER RIGHT   Final    Special Requests BOTTLES DRAWN AEROBIC AND ANAEROBIC 10CC   Final    Culture  Setup Time 04/23/2012 01:29   Final    Culture     Final    Value:        BLOOD CULTURE RECEIVED NO GROWTH TO DATE CULTURE WILL BE HELD FOR 5 DAYS BEFORE ISSUING A FINAL NEGATIVE REPORT   Report Status PENDING   Incomplete   CULTURE, BLOOD (ROUTINE X 2)     Status: Normal (Preliminary result)   Collection Time   04/22/12  6:15 PM      Component Value Range Status Comment   Specimen Description BLOOD HEMODIALYSIS CATHETER RIGHT   Final    Special Requests BOTTLES DRAWN AEROBIC AND ANAEROBIC 10CC   Final    Culture  Setup Time 04/23/2012 01:29   Final    Culture     Final    Value:        BLOOD CULTURE RECEIVED NO GROWTH TO DATE CULTURE WILL BE HELD FOR 5 DAYS BEFORE ISSUING A FINAL NEGATIVE REPORT   Report Status PENDING   Incomplete   MRSA PCR SCREENING     Status: Abnormal   Collection Time   04/22/12 10:38 PM      Component Value Range Status Comment   MRSA by PCR POSITIVE (*) NEGATIVE Final      Studies:  Recent x-ray studies have been reviewed in detail by the Attending Physician  Scheduled Meds:  Reviewed in detail by the Attending  Physician   Lonia Blood, MD [facility] Office  [phone] Pager [phone]  On-Call/Text Page:      [email or web address]      password [email or web address]  If 7PM-7AM, please contact night-coverage [email or web address] Password [email or web address] 04/28/2012, 2:15 PM   LOS: 6 days

## 2012-04-28 NOTE — Progress Notes (Signed)
[name] in [phone]- Pt is stable, oriented to floor, call bell within reach, meds reviewed, and on telemetry.

## 2012-04-28 NOTE — Progress Notes (Signed)
Patient transferred to 31. Report called to RN on [phone] and all questions answered.  Family aware of new room assignment.  Patient transferred via bed with NT.

## 2012-04-29 ENCOUNTER — Inpatient Hospital Stay (HOSPITAL_COMMUNITY): Payer: Medicare Other

## 2012-04-29 DIAGNOSIS — E119 Type 2 diabetes mellitus without complications: Secondary | ICD-10-CM

## 2012-04-29 DIAGNOSIS — F319 Bipolar disorder, unspecified: Secondary | ICD-10-CM

## 2012-04-29 LAB — GLUCOSE, CAPILLARY
Glucose-Capillary: 130 mg/dL — ABNORMAL HIGH (ref 70–99)
Glucose-Capillary: 209 mg/dL — ABNORMAL HIGH (ref 70–99)

## 2012-04-29 LAB — CBC
Hemoglobin: 9.6 g/dL — ABNORMAL LOW (ref 12.0–15.0)
MCHC: 32 g/dL (ref 30.0–36.0)
WBC: 9.6 10*3/uL (ref 4.0–10.5)

## 2012-04-29 LAB — CULTURE, BLOOD (ROUTINE X 2): Culture: NO GROWTH

## 2012-04-29 LAB — RENAL FUNCTION PANEL
Albumin: 2.3 g/dL — ABNORMAL LOW (ref 3.5–5.2)
GFR calc Af Amer: 12 mL/min — ABNORMAL LOW (ref >90)
Glucose, Bld: 129 mg/dL — ABNORMAL HIGH (ref 70–99)
Phosphorus: 6.2 mg/dL — ABNORMAL HIGH (ref 2.3–4.6)
Potassium: 3.3 mEq/L — ABNORMAL LOW (ref 3.5–5.1)
Sodium: 132 mEq/L — ABNORMAL LOW (ref 135–145)

## 2012-04-29 MED ORDER — NEPRO/CARBSTEADY PO LIQD
237.0000 mL | Freq: Two times a day (BID) | ORAL | Status: DC
  Administered 2012-04-29 – 2012-05-05 (×11): 237 mL via ORAL

## 2012-04-29 NOTE — Procedures (Signed)
I have seen and examined this patient and agree with the plan of care seen on dialysis  [facility] W 04/29/2012, 9:43 AM

## 2012-04-29 NOTE — Progress Notes (Signed)
Nutrition Follow-up  Intervention:   1. Add Nepro Shake po BID between meals, each supplement provides 425 kcal and 19 grams protein. 2. RD to continue to follow nutrition care plan  Assessment:   Extubation on 8/30. BSE completed on 8/30 recommending Dysphagia 1 with thin liquids. Upgraded to Regular diet on 9/2. Per chart, pt with essentially end-stage access.  Pt states that she is eating fair. Consuming small amounts of her meals 2/2 gastroparesis. Likes to drink Nepro between meals. Enjoys the vanilla flavor.  Diet Order:  Renal 60 - 70  Meds: Scheduled Meds:    . ALPRAZolam  0.5 mg Oral TID  . ARIPiprazole  10 mg Oral Daily  . cinacalcet  30 mg Oral Q supper  . heparin subcutaneous  5,000 Units Subcutaneous Q8H  . insulin aspart  0-20 Units Subcutaneous Q4H  . insulin glargine  5 Units Subcutaneous QHS  . metoCLOPramide  5 mg Oral TID AC  . NONFORMULARY OR COMPOUNDED ITEM 1 drop  1 drop Ophthalmic Q4H  . ofloxacin  1 drop Both Eyes QID  . PARoxetine  80 mg Oral Daily  . polyvinyl alcohol  1 drop Right Eye Daily  . vancomycin  1,000 mg Intravenous Q T,Th,Sa-HD  . DISCONTD: alteplase  2 mg Intracatheter Once   Continuous Infusions:    . DISCONTD: sodium chloride    . DISCONTD: sodium chloride 10 mL/hr at 04/26/12 0400   PRN Meds:.sodium chloride, sodium chloride, sodium chloride, acetaminophen, acetaminophen, calcium carbonate (dosed in mg elemental calcium), camphor-menthol, docusate sodium, feeding supplement (NEPRO CARB STEADY), heparin, heparin, hydrOXYzine, lidocaine, lidocaine-prilocaine, ondansetron (ZOFRAN) IV, ondansetron, oxyCODONE-acetaminophen, pentafluoroprop-tetrafluoroeth, sorbitol, zolpidem, DISCONTD: feeding supplement (NEPRO CARB STEADY), DISCONTD: heparin DISCONTD: promethazine  Labs:  CMP     Component Value Date/Time   NA 132* 04/29/2012 0925   K 3.3* 04/29/2012 0925   CL 91* 04/29/2012 0925   CO2 24 04/29/2012 0925   GLUCOSE 129* 04/29/2012 0925   BUN  44* 04/29/2012 0925   CREATININE 4.89* 04/29/2012 0925   CREATININE 1.78* 10/17/2008 2000   CALCIUM 9.1 04/29/2012 0925   CALCIUM 8.3* 08/21/2011 0533   PROT 6.9 03/07/2012 1419   ALBUMIN 2.3* 04/29/2012 0925   AST 11 03/07/2012 1419   ALT 9 03/07/2012 1419   ALKPHOS 191* 03/07/2012 1419   BILITOT 0.1* 03/07/2012 1419   GFRNONAA 10* 04/29/2012 0925   GFRAA 12* 04/29/2012 0925   Sodium  Date/Time Value Range Status  04/29/2012  9:25 AM 132* 135 - 145 mEq/L Final  04/26/2012 10:20 AM 136  135 - 145 mEq/L Final  04/24/2012  7:56 AM 136  135 - 145 mEq/L Final    Potassium  Date/Time Value Range Status  04/29/2012  9:25 AM 3.3* 3.5 - 5.1 mEq/L Final  04/26/2012 10:20 AM 4.0  3.5 - 5.1 mEq/L Final  04/24/2012  7:56 AM 4.0  3.5 - 5.1 mEq/L Final    Phosphorus  Date/Time Value Range Status  04/29/2012  9:25 AM 6.2* 2.3 - 4.6 mg/dL Final  4/78/2956 21:30 AM 9.4* 2.3 - 4.6 mg/dL Final  8/65/7846  9:62 AM 6.7* 2.3 - 4.6 mg/dL Final    Magnesium  Date/Time Value Range Status  04/23/2012  8:30 AM 2.7* 1.5 - 2.5 mg/dL Final  9/52/8413 24:40 PM 2.4  1.5 - 2.5 mg/dL Final  08/29/7251  6:64 AM 2.3  1.5 - 2.5 mg/dL Final   CBG (last 3)   Basename 04/29/12 1328 04/29/12 0726 04/29/12 0410  GLUCAP 130* 113*  89     Intake/Output Summary (Last 24 hours) at 04/29/12 1452 Last data filed at 04/29/12 1322  Gross per 24 hour  Intake   1210 ml  Output   3002 ml  Net  -1792 ml  BM 9/2  Weight Status:  85.1 kg s/p HD on 8/31  Re-estimated needs:  2100 - 2300 kcal, 100 - 115 grams protein  Nutrition Dx:  Inadequate oral intake now r/t variable appetite AEB pt report.  Goal: Pt to meet >/= 90% of their estimated nutrition needs; improving  Monitor: weight trends, lab trends, I/O's, PO intake, supplement tolerance  Jarold Motto MS, RD, LDN Pager: [phone] After-hours pager: [phone]

## 2012-04-29 NOTE — Progress Notes (Signed)
[facility] ROUNDING NOTE   Subjective:   Interval History:seen on dialysis and sleeping  Objective:  Vital signs in last 24 hours:  Temp:  [97.8 F (36.6 C)-98.3 F (36.8 C)] 98.2 F (36.8 C) (09/03 0910) Pulse Rate:  [84-109] 84  (09/03 0930) Resp:  [11-22] 12  (09/03 0930) BP: (145-171)/(40-89) 171/82 mmHg (09/03 0930) SpO2:  [95 %-100 %] 98 % (09/03 0910) Weight:  [88.2 kg (194 lb 7.1 oz)-89.2 kg (196 lb 10.4 oz)] 88.2 kg (194 lb 7.1 oz) (09/03 0910)  Weight change: 0 kg (0 lb) Filed Weights   04/28/12 0354 04/28/12 2053 04/29/12 0910  Weight: 89.2 kg (196 lb 10.4 oz) 89.2 kg (196 lb 10.4 oz) 88.2 kg (194 lb 7.1 oz)    Intake/Output: I/O last 3 completed shifts: In: 1630 [P.O.:1630] Out: 0    Intake/Output this shift:     ZOX:[id number] sleeping in bed- obvious asymmetric facial swelling  XBJ:YNWGN RRR, normal S1 and S2  Resp:Coarse BS bilaterally, expiratory wheeze audible  FAO:ZHYQ, obese, NT, BS normal  MVH:QIONG LE edema, 1-2+ UE/facial edema persists    Basic Metabolic Panel:  Lab 04/26/12 2952 04/24/12 0756 04/23/12 0830 04/22/12 2312 04/22/12 1304  NA 136 136 140 134* 130*  K 4.0 4.0 4.5 4.1 5.7*  CL 95* 100 98 94* 91*  CO2 22 19 21 23  14*  GLUCOSE 120* 82 93 246* 324*  BUN 29* 43* 38* 31* 84*  CREATININE 4.23* 4.58* 4.09* 3.41* 6.38*  CALCIUM 10.2 9.0 9.8 -- --  MG -- -- 2.7* -- --  PHOS 9.4* -- 6.7* -- --    Liver Function Tests:  Lab 04/26/12 1020  AST --  ALT --  ALKPHOS --  BILITOT --  PROT --  ALBUMIN 2.5*   No results found for this basename: LIPASE:5,AMYLASE:5 in the last 168 hours No results found for this basename: AMMONIA:3 in the last 168 hours  CBC:  Lab 04/27/12 0525 04/26/12 1020 04/24/12 0756 04/23/12 0830 04/22/12 1304  WBC 7.8 9.7 9.3 5.6 11.9*  NEUTROABS -- -- -- -- --  HGB 10.1* 10.3* 9.9* 10.0* 11.1*  HCT 33.4* 33.4* 31.4* 31.2* 35.3*  MCV 101.2* 100.0 97.8 98.4 99.4  PLT 215 226 340 PLATELET  CLUMPS NOTED ON SMEAR, COUNT APPEARS DECREASED 425*    Cardiac Enzymes: No results found for this basename: CKTOTAL:5,CKMB:5,CKMBINDEX:5,TROPONINI:5 in the last 168 hours  BNP: No components found with this basename: POCBNP:5  CBG:  Lab 04/29/12 0726 04/29/12 0410 04/29/12 0031 04/28/12 2052 04/28/12 1736  GLUCAP 113* 89 209* 229* 238*    Microbiology: Results for orders placed during the hospital encounter of 04/22/12  CULTURE, BLOOD (ROUTINE X 2)     Status: Normal   Collection Time   04/22/12  6:10 PM      Component Value Range Status Comment   Specimen Description BLOOD HEMODIALYSIS CATHETER RIGHT   Final    Special Requests BOTTLES DRAWN AEROBIC AND ANAEROBIC 10CC   Final    Culture  Setup Time 04/23/2012 01:29   Final    Culture NO GROWTH 5 DAYS   Final    Report Status 04/29/2012 FINAL   Final   CULTURE, BLOOD (ROUTINE X 2)     Status: Normal   Collection Time   04/22/12  6:15 PM      Component Value Range Status Comment   Specimen Description BLOOD HEMODIALYSIS CATHETER RIGHT   Final    Special Requests BOTTLES DRAWN AEROBIC AND ANAEROBIC  10CC   Final    Culture  Setup Time 04/23/2012 01:29   Final    Culture NO GROWTH 5 DAYS   Final    Report Status 04/29/2012 FINAL   Final   MRSA PCR SCREENING     Status: Abnormal   Collection Time   04/22/12 10:38 PM      Component Value Range Status Comment   MRSA by PCR POSITIVE (*) NEGATIVE Final     Coagulation Studies: No results found for this basename: LABPROT:5,INR:5 in the last 72 hours  Urinalysis: No results found for this basename: COLORURINE:2,APPERANCEUR:2,LABSPEC:2,PHURINE:2,GLUCOSEU:2,HGBUR:2,BILIRUBINUR:2,KETONESUR:2,PROTEINUR:2,UROBILINOGEN:2,NITRITE:2,LEUKOCYTESUR:2 in the last 72 hours    Imaging: No results found.   Medications:      . DISCONTD: sodium chloride    . DISCONTD: sodium chloride 10 mL/hr at 04/26/12 0400      . ALPRAZolam  0.5 mg Oral TID  . ARIPiprazole  10 mg Oral Daily  .  cinacalcet  30 mg Oral Q supper  . heparin subcutaneous  5,000 Units Subcutaneous Q8H  . insulin aspart  0-20 Units Subcutaneous Q4H  . insulin glargine  5 Units Subcutaneous QHS  . metoCLOPramide  5 mg Oral TID AC  . mupirocin ointment  1 application Nasal BID  . NONFORMULARY OR COMPOUNDED ITEM 1 drop  1 drop Ophthalmic Q4H  . ofloxacin  1 drop Both Eyes QID  . PARoxetine  80 mg Oral Daily  . polyvinyl alcohol  1 drop Right Eye Daily  . vancomycin  1,000 mg Intravenous Q T,Th,Sa-HD  . DISCONTD: alteplase  2 mg Intracatheter Once   sodium chloride, sodium chloride, sodium chloride, acetaminophen, acetaminophen, calcium carbonate (dosed in mg elemental calcium), camphor-menthol, docusate sodium, feeding supplement (NEPRO CARB STEADY), heparin, heparin, hydrOXYzine, lidocaine, lidocaine-prilocaine, ondansetron (ZOFRAN) IV, ondansetron, oxyCODONE-acetaminophen, pentafluoroprop-tetrafluoroeth, sorbitol, zolpidem, DISCONTD: feeding supplement (NEPRO CARB STEADY), DISCONTD: heparin DISCONTD: promethazine  Assessment/ Plan:  1. End stage renal disease/volume overload: patient is seen in dialysis  2. Superior vena cava syndrome: Difficult situation in this patient who has had challenging HD access situation- Left sided central circulation seems occluded and not amenable to PTA and Right sided IJ/subclavian seem stenotic at length- Family/patient meeting with palliative care team today to set the goals of care  3. Anemia of chronic kidney disease: Hemoglobin currently at goal, we'll restart ESA when hemoglobin starts trending down.  4. Decubiti/Shallow LE ulcers: On empiric IV vancomycin/analgesics, on MRSA contact precautions    LOS: 7 Winfred Redel W @TODAY @9 :43 AM

## 2012-04-29 NOTE — Progress Notes (Signed)
[facility] PROGRESS NOTE  [name] ZOX:[id number] DOB: [birth year] DOA: 04/22/2012 PCP: Ernestine Conrad, MD  Assessment/Plan: Principal Problem:  *SVC syndrome Active Problems:  HYPERTENSION, UNSPECIFIED  DVT  ESRD on dialysis  DM (diabetes mellitus), type 2 with complications  Ulcers of both lower extremities  Hyperkalemia  Pleural effusion  Acute respiratory failure  SVC syndrome / Volume overload  Left sided central circulation essentially occluded and not amenable to PTA and Right sided IJ/subclavian stenotic at length -Patient has not been compliant w/ HD as an outpatient and she has refused HD occasionally while as an inpatient.  She is saying now that she is fully on board with adhering to HD, diet, cares, etc. -  Last HD 9/3 -  Vascular following  ESRD on T/Th/Sat HD Nephrology is following  -  Defer electrolyte and anemia management to nephrology  Difficult vascular access  As per Nephrology/Vascular - pt appears to be essentially end-stage access.   -  Palliative has consulted  Hx of DVT/PE  On sub Q heparin for prophylaxis   OSA? Needs outpt sleep study per PCCM - is NOT on nightly CPAP here, and as per previous conversations, she does not wish to try it at this time   Rash suspicious for MRSA:  Appears resolved after 7 days of antibiotics -  Discontinue vancomycin  DM2  CBG 113-306 today - will need to follow trend closely  -  Continue lantus 5 with SSI  Shallow LE ulcers, improving.   No change in tx plan at this time   Buttock ulcers, worsening and covered in stool.  Has two that appear stage III and two smaller ones that look like stage II.  Patient currently incontinent of stool -  Wound care consultation -  Frequent bathroom trips by nursing  CAD s/p stents (Hochrein)  on Plavix   Gastroparesis, patient states this is improved  DIET:  Renal, diabetic ACCESS:  Two small upper extremity IVs, HD catheter right chest PROPH:  Heparin  subcut  Code Status: Full Family Communication: Spoke with patient, her sister and her mom at length.  Patient would like to go home, but previously her mother and sister had been taking care of her with great difficulty.  The family members do not think they can physically care for her at this time despite the home services they had been receiving.  Spoke to the patient about the need to get stronger and work well with physical therapy.  Asked that she listen to their recommendations and patient seemed amenable at the time.  Disposition Plan:  Pending PT evaluation and a family consensus.   Brief narrative:  41yo F ESRD on HD tue/thu/sat, difficult access, last HD 8/22 , recent aortobifemoral bypass surgery ([facility]) presented 8/27 with gradually increasing swelling of face & BUEs, erythematous papular rash over upper extremities & ears, gen weakness & worse wounds over buttocks. Required emergent HD for hyperkalemia. PCCM admitted.  H/o contrast allergy  Other PMH : DM-2, bipolar, prior MRSA  Per sister, LE & groin wounds have improved after bypass but sacral decubs have worsened & she has new rash , no fevers but feeling unwell to the point of missing HD x 2.  Consultants: Nephrology  PCCM - signed off 9/01  Procedures: RIJ permacath 7/22 exchanged 8/2 with SVC angioplasty (IR) exchanged 8/28  8/28 angiogram (OR) >>"functional SVC occlusion" due to HD cath.  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  #2 diffusely narrowed right  internal jugular vein, right subclavian vein, and superior vena cava to approximately 5 mm lumen  #3 23 cm Diatek catheter with tip in right atrium   Antibiotics: Vanc 8/27 >> 9/3   HPI/Subjective:  Patient states that she feels well.  Denies chest pain, shortness of breath, nausea, vomiting, diarrhea.  She has gas and has been incontinent of small amounts of stool.    Objective: Filed Vitals:   04/29/12 1257 04/29/12 1320 04/29/12 1707 04/29/12  2039  BP: 110/57 143/77 141/75 125/80  Pulse: 94 96 95 95  Temp: 97.2 F (36.2 C) 97.6 F (36.4 C) 98.3 F (36.8 C) 97.4 F (36.3 C)  TempSrc: Oral   Oral  Resp: 10 16 17 17   Height:      Weight: 85.3 kg (188 lb 0.8 oz)   84.414 kg (186 lb 1.6 oz)  SpO2: 100% 100% 99% 94%    Intake/Output Summary (Last 24 hours) at 04/29/12 2302 Last data filed at 04/29/12 2041  Gross per 24 hour  Intake   1080 ml  Output   3002 ml  Net  -1922 ml   Filed Weights   04/29/12 0910 04/29/12 1257 04/29/12 2039  Weight: 88.2 kg (194 lb 7.1 oz) 85.3 kg (188 lb 0.8 oz) 84.414 kg (186 lb 1.6 oz)    Exam:   General:  CF, no acute distress, sitting at edge of bed.  Assisted in some cares today such as pulling up pants and standing briefly at side of bed.  Patient requires two strong people to assist with both activities.    HEENT:  Face and neck appear swollen but not erythematous or indurated.  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  Cardiovascular: RRR, no appeciable murmur, rub, or gallop  Chest:  HD catheter intact  Respiratory:  Limited by body habitus, but sound CTAB  Abdomen:  Hyperactive BS, soft, nondistended, nontender.  Large midline scar running cranio-caudally with smaller pock marks throughout.    SKin:  Groin - right groin with stage I/II ulcer which is in the final stages of healing.  Left groin with stage II ulcer which appears to be healing well.  Buttocks with 2 stage 3 and 2 stage 2 ulcers which are covered in stool (painful per patient).  Lower extremities with hyperpigmentation.  Feel cold.  Multiple ulcers on lower legs also.  Right thigh with aneurysmal outpouches  Psych:  Patient insight is poor.    Neuro:  Extreme difficulty with hip extension.  Weak core strength.  Data Reviewed: Basic Metabolic Panel:  Lab 04/29/12 5409 04/26/12 1020 04/24/12 0756 04/23/12 0830 04/22/12 2312  NA 132* 136 136 140 134*  K 3.3* 4.0 4.0 4.5 4.1  CL  91* 95* 100 98 94*  CO2 24 22 19 21 23   GLUCOSE 129* 120* 82 93 246*  BUN 44* 29* 43* 38* 31*  CREATININE 4.89* 4.23* 4.58* 4.09* 3.41*  CALCIUM 9.1 10.2 9.0 9.8 9.5  MG -- -- -- 2.7* --  PHOS 6.2* 9.4* -- 6.7* --   Liver Function Tests:  Lab 04/29/12 0925 04/26/12 1020  AST -- --  ALT -- --  ALKPHOS -- --  BILITOT -- --  PROT -- --  ALBUMIN 2.3* 2.5*   No results found for this basename: LIPASE:5,AMYLASE:5 in the last 168 hours No results found for this basename: AMMONIA:5 in the last 168 hours CBC:  Lab 04/29/12 0925 04/27/12 0525 04/26/12 1020 04/24/12 0756  04/23/12 0830  WBC 9.6 7.8 9.7 9.3 5.6  NEUTROABS -- -- -- -- --  HGB 9.6* 10.1* 10.3* 9.9* 10.0*  HCT 30.0* 33.4* 33.4* 31.4* 31.2*  MCV 96.2 101.2* 100.0 97.8 98.4  PLT 238 215 226 340 PLATELET CLUMPS NOTED ON SMEAR, COUNT APPEARS DECREASED   Cardiac Enzymes: No results found for this basename: CKTOTAL:5,CKMB:5,CKMBINDEX:5,TROPONINI:5 in the last 168 hours BNP (last 3 results)  Basename 02/14/12 1447 08/20/11 1010  PROBNP 13271.0* 4201.0*   CBG:  Lab 04/29/12 2115 04/29/12 1708 04/29/12 1328 04/29/12 0726 04/29/12 0410  GLUCAP 139* 306* 130* 113* 89    Recent Results (from the past 240 hour(s))  CULTURE, BLOOD (ROUTINE X 2)     Status: Normal   Collection Time   04/22/12  6:10 PM      Component Value Range Status Comment   Specimen Description BLOOD HEMODIALYSIS CATHETER RIGHT   Final    Special Requests BOTTLES DRAWN AEROBIC AND ANAEROBIC 10CC   Final    Culture  Setup Time 04/23/2012 01:29   Final    Culture NO GROWTH 5 DAYS   Final    Report Status 04/29/2012 FINAL   Final   CULTURE, BLOOD (ROUTINE X 2)     Status: Normal   Collection Time   04/22/12  6:15 PM      Component Value Range Status Comment   Specimen Description BLOOD HEMODIALYSIS CATHETER RIGHT   Final    Special Requests BOTTLES DRAWN AEROBIC AND ANAEROBIC 10CC   Final    Culture  Setup Time 04/23/2012 01:29   Final    Culture NO  GROWTH 5 DAYS   Final    Report Status 04/29/2012 FINAL   Final   MRSA PCR SCREENING     Status: Abnormal   Collection Time   04/22/12 10:38 PM      Component Value Range Status Comment   MRSA by PCR POSITIVE (*) NEGATIVE Final      Studies: Dg Chest 2 View  04/22/2012  *RADIOLOGY REPORT*  Clinical Data: Chest pain and shortness of breath.  CHEST - 2 VIEW  Comparison: Chest x-ray 03/07/2012.  Findings: Lung volumes are low.  No consolidative airspace disease. Small right pleural effusion.  Pulmonary vasculature is normal. Heart size is upper limits of normal.  Mediastinal contours are unremarkable.  Right-sided internal jugular PermCath with tips terminating in the right atrium.  Atherosclerosis in the thoracic aorta.  IMPRESSION: 1.  Small right-sided pleural effusion. 2.  Atherosclerosis.   Original Report Authenticated By: Florencia Reasons, M.D.    Dg Chest Port 1 View  04/24/2012  *RADIOLOGY REPORT*  Clinical Data: Follow up left lung densities.  PORTABLE CHEST - 1 VIEW  Comparison: 04/23/2012  Findings: Endotracheal tube is 3.3 cm above the carina.  Dialysis catheter in the right atrium.  The densities in the left upper lung have resolved.  There are new densities in the left mid and lower lung.  Right lung remains clear without focal airspace disease. Heart size is within normal limits.  IMPRESSION: Improved aeration in the left upper lung.  New densities in the left lower lung.  Findings are probably related to shifting atelectasis and possibly mucous plugging.  Overall, there is improved aeration in the left lung.   Original Report Authenticated By: Richarda Overlie, M.D.    Dg Chest Port 1 View  04/23/2012  *RADIOLOGY REPORT*  Clinical Data: Post intubation.  Dialysis catheter replaced.  PORTABLE CHEST - 1 VIEW  Comparison: 04/23/2012.  Findings: Endotracheal tube is present with the tip 36 mm from the carina.  Right IJ dialysis catheter present with the distal tip in the right atrium.  Basilar  atelectasis is present.  In the interval since the prior exam earlier today (0618 hours), there is new opacification of the left upper lateral hemithorax, some of which is due to scapular overlap.  No pneumothorax.  IMPRESSION: 1. Endotracheal tube 36 mm from the carina. 2.  Right IJ central line present within the distal tip in the right atrium. 3.  New opacification of the left upper chest.  If left vascular access was attempted, potentially this could represent hemothorax. The other primary differential consideration is left upper lobe atelectasis associated with mucous plugging.   Original Report Authenticated By: Andreas Newport, M.D.    Portable Chest Xray  04/23/2012  *RADIOLOGY REPORT*  Clinical Data: Dialysis catheter insertion.  PORTABLE CHEST - 1 VIEW  Comparison: 04/22/2012.  Findings: Right IJ dialysis catheter tip projects in the region of the SVC/RA junction.  Trachea is midline.  Heart size normal. There may be mild diffuse interstitial prominence.  Tiny right pleural effusion. A vascular stent is seen in the left axilla.  IMPRESSION: Possible mild edema with a small right pleural effusion.   Original Report Authenticated By: Reyes Ivan, M.D.     Scheduled Meds:   . ALPRAZolam  0.5 mg Oral TID  . ARIPiprazole  10 mg Oral Daily  . cinacalcet  30 mg Oral Q supper  . feeding supplement (NEPRO CARB STEADY)  237 mL Oral BID BM  . heparin subcutaneous  5,000 Units Subcutaneous Q8H  . insulin aspart  0-20 Units Subcutaneous Q4H  . insulin glargine  5 Units Subcutaneous QHS  . metoCLOPramide  5 mg Oral TID AC  . NONFORMULARY OR COMPOUNDED ITEM 1 drop  1 drop Ophthalmic Q4H  . ofloxacin  1 drop Both Eyes QID  . PARoxetine  80 mg Oral Daily  . polyvinyl alcohol  1 drop Right Eye Daily  . vancomycin  1,000 mg Intravenous Q T,Th,Sa-HD   Continuous Infusions:   Principal Problem:  *SVC syndrome Active Problems:  HYPERTENSION, UNSPECIFIED  DVT  ESRD on dialysis  DM (diabetes  mellitus), type 2 with complications  Ulcers of both lower extremities  Hyperkalemia  Pleural effusion  Acute respiratory failure    Time spent: 45    Amador Braddy, [facility]  [facility] Pager [phone]. If 8PM-8AM, please contact night-coverage at [email or web address], password [facility] 04/29/2012, 11:02 PM  LOS: 7 days

## 2012-04-29 NOTE — Progress Notes (Signed)
Pt off unit

## 2012-04-30 LAB — GLUCOSE, CAPILLARY
Glucose-Capillary: 230 mg/dL — ABNORMAL HIGH (ref 70–99)
Glucose-Capillary: 228 mg/dL — ABNORMAL HIGH (ref 70–99)
Glucose-Capillary: 198 mg/dL — ABNORMAL HIGH (ref 70–99)

## 2012-04-30 MED ORDER — HEPARIN SODIUM (PORCINE) 1000 UNIT/ML DIALYSIS
20.0000 [IU]/kg | INTRAMUSCULAR | Status: DC | PRN
  Filled 2012-04-30: qty 2

## 2012-04-30 MED ORDER — MENTHOL 3 MG MT LOZG
1.0000 | LOZENGE | OROMUCOSAL | Status: DC | PRN
  Administered 2012-05-02: 3 mg via ORAL
  Filled 2012-04-30 (×2): qty 9

## 2012-04-30 NOTE — Progress Notes (Addendum)
Patient ID: [name], female   DOB: 1971/08/23, 41 y.o.   MRN: [id number]  [facility] PROGRESS NOTE  [name] JYN:[id number] DOB: 08-14-1971 DOA: 04/22/2012 PCP: Ernestine Conrad, MD  Brief narrative: 41 yo F ESRD on HD tue/thu/sat, difficult access, last HD 8/22 , recent aortobifemoral bypass surgery ([facility]) presented 8/27 with gradually increasing swelling of face & BUEs, erythematous papular rash over upper extremities & ears, gen weakness & worse wounds over buttocks. Required emergent HD for hyperkalemia. PCCM admitted.   Principal Problem:  *SVC syndrome - vascular team following - pt with very difficult access and for that reason palliative care consult obatained - will follow up on recommendations - last HD 09/03  Active Problems:  HYPERTENSION, UNSPECIFIED - stable during the hospital stay - continue to monitor vitals per floor protocol   DVT - on sub Q heparin for prophylaxis    ESRD on dialysis - managed by nephrology team   DM (diabetes mellitus), type 2 with complications - continue Lantus as per home medication regimen   Ulcers of both lower extremities - completed treatment with Vancomycin and no off all antibiotics    Hyperkalemia - now slightly low - will have nephrology team readjust the dialysis medium with K if needed - renal panel in AM   Acute respiratory failure - now improved and pt denies SOB   Anemia of chronic disease - secondary to ESRD - Hg and Hct are stable and at pt's baseline - CBC in AM  Consultants:  PCCM - signed off  Nephrology  Procedures/Studies:  RIJ permacath 7/22 exchanged 8/2 with SVC angioplasty (IR) exchanged 8/28   08/28 angiogram (OR) --> "functional SVC occlusion" due to HD cath.  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  #2 diffusely narrowed right internal jugular vein, right subclavian vein, and superior vena cava to approximately 5 mm lumen  #3 23 cm Diatek catheter with tip in  right atrium   Antibiotics:  Vancomycin 08/27 - 09/02  Code Status: Full Family Communication: Pt at bedside Disposition Plan: PT evaluation pending  HPI/Subjective: No events overnight.   Objective: Filed Vitals:   04/29/12 2039 04/30/12 0446 04/30/12 0917 04/30/12 1414  BP: 125/80 161/84 135/58 148/72  Pulse: 95 97 87 92  Temp: 97.4 F (36.3 C) 97.5 F (36.4 C) 97.8 F (36.6 C) 97.6 F (36.4 C)  TempSrc: Oral Oral    Resp: 17 18 16 18   Height:      Weight: 84.414 kg (186 lb 1.6 oz)     SpO2: 94% 99% 95% 99%    Intake/Output Summary (Last 24 hours) at 04/30/12 1530 Last data filed at 04/30/12 0917  Gross per 24 hour  Intake    240 ml  Output      0 ml  Net    240 ml    Exam:   General:  Pt is alert, follows commands appropriately, not in acute distress  Cardiovascular: Regular rate and rhythm, distant heart sounds due to body habitus  Respiratory: Diminished breath sounds and poor air movement at bases  Abdomen: Soft, non tender, non distended, bowel sounds present, no guarding  Extremities: +1 bilateral pitting edema, pulses DP and PT palpable bilaterally  Neuro: Grossly nonfocal  Data Reviewed: Basic Metabolic Panel:  Lab 04/29/12 8657 04/26/12 1020 04/24/12 0756  NA 132* 136 136  K 3.3* 4.0 4.0  CL 91* 95* 100  CO2 24 22 19   GLUCOSE 129* 120* 82  BUN 44*  29* 43*  CREATININE 4.89* 4.23* 4.58*  CALCIUM 9.1 10.2 9.0  MG -- -- --  PHOS 6.2* 9.4* --   Liver Function Tests:  Lab 04/29/12 0925 04/26/12 1020  AST -- --  ALT -- --  ALKPHOS -- --  BILITOT -- --  PROT -- --  ALBUMIN 2.3* 2.5*   No results found for this basename: LIPASE:5,AMYLASE:5 in the last 168 hours No results found for this basename: AMMONIA:5 in the last 168 hours CBC:  Lab 04/29/12 0925 04/27/12 0525 04/26/12 1020 04/24/12 0756  WBC 9.6 7.8 9.7 9.3  NEUTROABS -- -- -- --  HGB 9.6* 10.1* 10.3* 9.9*  HCT 30.0* 33.4* 33.4* 31.4*  MCV 96.2 101.2* 100.0 97.8  PLT 238  215 226 340   CBG:  Lab 04/30/12 1140 04/30/12 0725 04/30/12 0428 04/30/12 0023 04/29/12 2115  GLUCAP 230* 237* 107* 167* 139*    Recent Results (from the past 240 hour(s))  CULTURE, BLOOD (ROUTINE X 2)     Status: Normal   Collection Time   04/22/12  6:10 PM      Component Value Range Status Comment   Specimen Description BLOOD HEMODIALYSIS CATHETER RIGHT   Final    Special Requests BOTTLES DRAWN AEROBIC AND ANAEROBIC 10CC   Final    Culture  Setup Time 04/23/2012 01:29   Final    Culture NO GROWTH 5 DAYS   Final    Report Status 04/29/2012 FINAL   Final   CULTURE, BLOOD (ROUTINE X 2)     Status: Normal   Collection Time   04/22/12  6:15 PM      Component Value Range Status Comment   Specimen Description BLOOD HEMODIALYSIS CATHETER RIGHT   Final    Special Requests BOTTLES DRAWN AEROBIC AND ANAEROBIC 10CC   Final    Culture  Setup Time 04/23/2012 01:29   Final    Culture NO GROWTH 5 DAYS   Final    Report Status 04/29/2012 FINAL   Final   MRSA PCR SCREENING     Status: Abnormal   Collection Time   04/22/12 10:38 PM      Component Value Range Status Comment   MRSA by PCR POSITIVE (*) NEGATIVE Final      Scheduled Meds:   . ALPRAZolam  0.5 mg Oral TID  . ARIPiprazole  10 mg Oral Daily  . cinacalcet  30 mg Oral Q supper  . feeding supplement (NEPRO CARB STEADY)  237 mL Oral BID BM  . heparin subcutaneous  5,000 Units Subcutaneous Q8H  . insulin aspart  0-20 Units Subcutaneous Q4H  . insulin glargine  5 Units Subcutaneous QHS  . metoCLOPramide  5 mg Oral TID AC  . NONFORMULARY OR COMPOUNDED ITEM 1 drop  1 drop Ophthalmic Q4H  . ofloxacin  1 drop Both Eyes QID  . PARoxetine  80 mg Oral Daily  . polyvinyl alcohol  1 drop Right Eye Daily  . DISCONTD: vancomycin  1,000 mg Intravenous Q T,Th,Sa-HD   Continuous Infusions:    Debbora Presto, MD  [facility] Pager [phone]  If 7PM-7AM, please contact night-coverage [email or web address] Password  [email or web address] 04/30/2012, 3:30 PM   LOS: 8 days

## 2012-04-30 NOTE — Progress Notes (Signed)
Patient ZO:[id number] Kalama      DOB: 26-Sep-1970      VWU:[id number]  I am following up sign out of  Ms. Altamirano from Palliative Care Rounds this am.  Please see the initial consult dated 9/1 by Dr. Sharl Ma.  I have reviewed the case with Dr. Sharl Ma.  At this time the patient has established full care / full code status in the face of multiple life threatening illnesses.  I communicated with Ms. Helm this am.  She relates not getting very good sleep last night, but tells me she is satisfied with the goals that she has set for herself.  At this time we would like assist in anyway that we can but goals have been established. Will sign off and would appreciate the opportunity to discuss further goals of care as the circumstances arise and the patient permits.  Atharva Mirsky L. Ladona Ridgel, MD MBA The Palliative Medicine Team at [facility] Phone: [phone] Pager: [phone]

## 2012-04-30 NOTE — Progress Notes (Signed)
Occupational Therapy Treatment Patient Details Name: [name] MRN: [id number] DOB: [birth year] Today's Date: 04/30/2012 Time: 1324-4010 OT Time Calculation (min): 23 min  OT Assessment / Plan / Recommendation Comments on Treatment Session      Follow Up Recommendations  Inpatient Rehab    Barriers to Discharge       Equipment Recommendations  None recommended by OT    Recommendations for Other Services Rehab consult  Frequency Min 2X/week   Plan      Precautions / Restrictions Precautions Precautions: Fall   Pertinent Vitals/Pain Reports back pain with activity- pre-medicated and repositioned for pain relief.    ADL  Eating/Feeding: Performed;Independent Where Assessed - Eating/Feeding: Chair Grooming: Performed;Wash/dry face;Set up Where Assessed - Grooming: Supported sitting Upper Body Bathing: Performed;Minimal assistance Where Assessed - Upper Body Bathing: Supported sitting Lower Body Bathing: Performed;Moderate assistance Where Assessed - Lower Body Bathing: Supported sit to stand Upper Body Dressing: Performed;Minimal assistance Where Assessed - Upper Body Dressing: Supported sitting Lower Body Dressing: Performed;Maximal assistance Where Assessed - Lower Body Dressing: Supported sit to Pharmacist, hospital: Performed;Moderate assistance Toilet Transfer Method: Stand pivot;Sit to Barista: Bedside commode Toileting - Clothing Manipulation and Hygiene: Performed;+1 Total assistance Where Assessed - Engineer, mining and Hygiene: Sit to stand from 3-in-1 or toilet Equipment Used: Gait belt Transfers/Ambulation Related to ADLs: Mod A with stand pivot transfers, cues for upright standing. Ambulation not attempted Pt requesting to use bedside commode- transferred with Mod A. Pt is fearful of falling.   OT Diagnosis: Generalized weakness;Acute pain  OT Problem List: Decreased strength;Decreased activity tolerance;Impaired  balance (sitting and/or standing);Decreased knowledge of use of DME or AE;Decreased knowledge of precautions;Pain OT Treatment Interventions: Self-care/ADL training;DME and/or AE instruction;Patient/family education;Balance training;Therapeutic activities   OT Goals Acute Rehab OT Goals OT Goal Formulation: With patient Time For Goal Achievement: 05/14/12 Potential to Achieve Goals: Good ADL Goals Pt Will Perform Grooming: with supervision;Standing at sink;Sitting at sink ADL Goal: Grooming - Progress: Goal set today Pt Will Perform Upper Body Dressing: with set-up;Sitting, bed;Sitting, chair ADL Goal: Upper Body Dressing - Progress: Goal set today Pt Will Perform Lower Body Dressing: with modified independence;with adaptive equipment;Sit to stand from chair;Sit to stand from bed ADL Goal: Lower Body Dressing - Progress: Goal set today Pt Will Transfer to Toilet: with supervision;Stand pivot transfer;with DME ADL Goal: Toilet Transfer - Progress: Goal set today Pt Will Perform Toileting - Clothing Manipulation: with supervision;Standing ADL Goal: Toileting - Clothing Manipulation - Progress: Goal set today Pt Will Perform Tub/Shower Transfer: Tub transfer;Shower seat without back;Ambulation;with DME ADL Goal: Tub/Shower Transfer - Progress: Goal set today  Visit Information  Last OT Received On: 04/30/12 Assistance Needed: +1 (+2 may be helpful for initial ambulation)    Subjective Data  Subjective: I know I need to get stronger Patient Stated Goal: Return home   Prior Functioning  Home Living Lives With: Family Available Help at Discharge: Family;Available 24 hours/day Type of Home: House Home Access: Ramped entrance Home Layout: One level Bathroom Shower/Tub: Nurse, adult Accessibility: Yes How Accessible: Accessible via wheelchair;Accessible via walker Home Adaptive Equipment: Shower chair with back;Walker - rolling;Wheelchair - manual Prior Function Level of  Independence: Needs assistance Needs Assistance: Bathing;Dressing;Toileting;Meal Prep;Light Housekeeping;Gait;Transfers Driving: No Communication Communication: No difficulties Dominant Hand: Right    Cognition  Overall Cognitive Status: Appears within functional limits for tasks assessed/performed Arousal/Alertness: Awake/alert Orientation Level: Appears intact for tasks assessed Behavior During Session: [name] for tasks performed  Mobility  Shoulder Instructions Bed Mobility Bed Mobility:  (presented EOB) Transfers Sit to Stand: 3: Mod assist;With armrests;From chair/3-in-1 Stand to Sit: 4: Min assist;With armrests;To chair/3-in-1 Details for Transfer Assistance: cues for safety and technique; assist to achieve upright standing and encourage released grip from surface stood from       Exercises   encouraged chair push-ups throughout day as well as against gravity shoulder and elbow flexion/extension.   Balance     End of Session OT - End of Session Equipment Utilized During Treatment: Gait belt Activity Tolerance: Patient tolerated treatment well Patient left: in chair;with call bell/phone within reach;with family/visitor present Nurse Communication: Mobility status  GO     [name] 04/30/2012, 4:50 PM

## 2012-04-30 NOTE — Progress Notes (Signed)
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 presents with decr functional mobility; Will benefit from PT to maximize independence and safety with mobility, amb, activity tol, and to facilitate dc planning;   Lengthy discussion re: pt's goal -- which is to be more independent and less dependent on her mother and sister for her care; Feel it is worth considering a Rehab Consult to look into feasibility of CIR stay    PT Assessment  Patient needs continued PT services    Follow Up Recommendations  Inpatient Rehab    Barriers to Discharge None      Equipment Recommendations  None recommended by PT    Recommendations for Other Services Rehab consult   Frequency Min 3X/week    Precautions / Restrictions Precautions Precautions: Fall   Pertinent Vitals/Pain Reports back pain, no severity given; was able to position pt comfortably in recliner      Mobility  Bed Mobility Bed Mobility:  (presented EOB)   Transfers Transfers: Sit to Stand;Stand to Sit Sit to Stand: 3: Mod assist;From bed Stand to Sit: 3: Mod assist;With upper extremity assist;To chair/3-in-1 Details for Transfer Assistance: cues fro safety and techhnique; noted weakness from general deconditioning as pt had not been OOB in quite a while   Ambulation/Gait Ambulation/Gait Assistance: 4: Min assist Ambulation Distance (Feet): 4 Feet Assistive device: 1 person hand held assist Ambulation/Gait Assistance Details: Cues for posture; Definite need for UE support for steadiness Gait Pattern: Shuffle    Exercises     PT Diagnosis: Generalized weakness;Difficulty walking  PT Problem List: Decreased strength;Decreased activity tolerance;Decreased  balance;Decreased mobility;Decreased knowledge of use of DME PT Treatment Interventions: DME instruction;Gait training;Functional mobility training;Therapeutic activities;Therapeutic exercise;Patient/family education   PT Goals Acute Rehab PT Goals PT Goal Formulation: With patient Time For Goal Achievement: 05/14/12 Potential to Achieve Goals: Good Pt will go Supine/Side to Sit: with modified independence PT Goal: Supine/Side to Sit - Progress: Goal set today Pt will go Sit to Supine/Side: with modified independence PT Goal: Sit to Supine/Side - Progress: Goal set today Pt will go Sit to Stand: with modified independence PT Goal: Sit to Stand - Progress: Goal set today Pt will go Stand to Sit: with modified independence PT Goal: Stand to Sit - Progress: Goal set today Pt will Ambulate: 51 - 150 feet;with modified independence;with rolling walker PT Goal: Ambulate - Progress: Goal set today  Visit Information  Last PT Received On: 04/30/12 Assistance Needed: +1 (+2 may be helpful for initial ambulation)    Subjective Data  Subjective: Agreeable to OOB Patient Stated Goal: to be more independent and less dependent on mother and sister for her care   Prior Functioning  Home Living Lives With: Family Available Help at Discharge: Family;Available 24 hours/day Type of Home: House Home Access: Ramped entrance Home Layout: One level Bathroom Shower/Tub: Nurse, adult Accessibility: Yes How Accessible: Accessible via wheelchair;Accessible via walker Home Adaptive Equipment: Shower chair with back;Walker - rolling;Wheelchair - manual Prior Function Level of Independence: Needs assistance Needs Assistance: Bathing;Dressing;Toileting;Meal Prep;Light Housekeeping;Gait;Transfers Communication Communication: No difficulties    Cognition  Overall Cognitive Status: Appears within functional limits for tasks assessed/performed Arousal/Alertness: Awake/alert Orientation Level:  Appears intact for tasks assessed Behavior During Session: [facility] for tasks performed    Extremity/Trunk Assessment  Right Upper Extremity Assessment RUE ROM/Strength/Tone:  (Will defer to OT) Left Upper Extremity Assessment LUE ROM/Strength/Tone:  (Will defer to OT) Right Lower Extremity Assessment RLE ROM/Strength/Tone: Deficits RLE ROM/Strength/Tone Deficits: Generalized weakness Left Lower Extremity Assessment LLE ROM/Strength/Tone: Deficits LLE ROM/Strength/Tone Deficits: Generalized weakness   Balance    End of Session PT - End of Session Activity Tolerance: Patient limited by fatigue Patient left: in chair;with call bell/phone within reach;with family/visitor present Nurse Communication: Mobility status  GP     Van Clines [facility] [place], [place] [phone]  04/30/2012, 4:05 PM

## 2012-04-30 NOTE — Progress Notes (Signed)
[facility] ROUNDING NOTE   Subjective:   Interval History:none awake at alert edema improved  Objective:  Vital signs in last 24 hours:  Temp:  [97.4 F (36.3 C)-98.3 F (36.8 C)] 97.8 F (36.6 C) (09/04 0917) Pulse Rate:  [87-97] 87  (09/04 0917) Resp:  [16-18] 16  (09/04 0917) BP: (125-161)/(58-84) 135/58 mmHg (09/04 0917) SpO2:  [94 %-100 %] 95 % (09/04 0917) Weight:  [84.414 kg (186 lb 1.6 oz)] 84.414 kg (186 lb 1.6 oz) (09/03 2039)  Weight change: -1 kg (-2 lb 3.3 oz) Filed Weights   04/29/12 0910 04/29/12 1257 04/29/12 2039  Weight: 88.2 kg (194 lb 7.1 oz) 85.3 kg (188 lb 0.8 oz) 84.414 kg (186 lb 1.6 oz)    Intake/Output: I/O last 3 completed shifts: In: 1320 [P.O.:1320] Out: 3002 [Other:3002]   Intake/Output this shift:     ZOX:WRUEAVWUJWJ sleeping in bed- obvious asymmetric facial swelling  XBJ:YNWGN RRR, normal S1 and S2  Resp:Coarse BS bilaterally, expiratory wheeze audible  FAO:ZHYQ, obese, NT, BS normal  MVH:QIONG LE edema, 1-2+ UE/facial edema persists    Basic Metabolic Panel:  Lab 04/29/12 2952 04/26/12 1020 04/24/12 0756  NA 132* 136 136  K 3.3* 4.0 4.0  CL 91* 95* 100  CO2 24 22 19   GLUCOSE 129* 120* 82  BUN 44* 29* 43*  CREATININE 4.89* 4.23* 4.58*  CALCIUM 9.1 10.2 9.0  MG -- -- --  PHOS 6.2* 9.4* --    Liver Function Tests:  Lab 04/29/12 0925 04/26/12 1020  AST -- --  ALT -- --  ALKPHOS -- --  BILITOT -- --  PROT -- --  ALBUMIN 2.3* 2.5*   No results found for this basename: LIPASE:5,AMYLASE:5 in the last 168 hours No results found for this basename: AMMONIA:3 in the last 168 hours  CBC:  Lab 04/29/12 0925 04/27/12 0525 04/26/12 1020 04/24/12 0756  WBC 9.6 7.8 9.7 9.3  NEUTROABS -- -- -- --  HGB 9.6* 10.1* 10.3* 9.9*  HCT 30.0* 33.4* 33.4* 31.4*  MCV 96.2 101.2* 100.0 97.8  PLT 238 215 226 340    Cardiac Enzymes: No results found for this basename: CKTOTAL:5,CKMB:5,CKMBINDEX:5,TROPONINI:5 in the last  168 hours  BNP: No components found with this basename: POCBNP:5  CBG:  Lab 04/30/12 1140 04/30/12 0725 04/30/12 0428 04/30/12 0023 04/29/12 2115  GLUCAP 230* 237* 107* 167* 139*    Microbiology: Results for orders placed during the hospital encounter of 04/22/12  CULTURE, BLOOD (ROUTINE X 2)     Status: Normal   Collection Time   04/22/12  6:10 PM      Component Value Range Status Comment   Specimen Description BLOOD HEMODIALYSIS CATHETER RIGHT   Final    Special Requests BOTTLES DRAWN AEROBIC AND ANAEROBIC 10CC   Final    Culture  Setup Time 04/23/2012 01:29   Final    Culture NO GROWTH 5 DAYS   Final    Report Status 04/29/2012 FINAL   Final   CULTURE, BLOOD (ROUTINE X 2)     Status: Normal   Collection Time   04/22/12  6:15 PM      Component Value Range Status Comment   Specimen Description BLOOD HEMODIALYSIS CATHETER RIGHT   Final    Special Requests BOTTLES DRAWN AEROBIC AND ANAEROBIC 10CC   Final    Culture  Setup Time 04/23/2012 01:29   Final    Culture NO GROWTH 5 DAYS   Final    Report Status 04/29/2012  FINAL   Final   MRSA PCR SCREENING     Status: Abnormal   Collection Time   04/22/12 10:38 PM      Component Value Range Status Comment   MRSA by PCR POSITIVE (*) NEGATIVE Final     Coagulation Studies: No results found for this basename: LABPROT:5,INR:5 in the last 72 hours  Urinalysis: No results found for this basename: COLORURINE:2,APPERANCEUR:2,LABSPEC:2,PHURINE:2,GLUCOSEU:2,HGBUR:2,BILIRUBINUR:2,KETONESUR:2,PROTEINUR:2,UROBILINOGEN:2,NITRITE:2,LEUKOCYTESUR:2 in the last 72 hours    Imaging: No results found.   Medications:        . ALPRAZolam  0.5 mg Oral TID  . ARIPiprazole  10 mg Oral Daily  . cinacalcet  30 mg Oral Q supper  . feeding supplement (NEPRO CARB STEADY)  237 mL Oral BID BM  . heparin subcutaneous  5,000 Units Subcutaneous Q8H  . insulin aspart  0-20 Units Subcutaneous Q4H  . insulin glargine  5 Units Subcutaneous QHS  .  metoCLOPramide  5 mg Oral TID AC  . NONFORMULARY OR COMPOUNDED ITEM 1 drop  1 drop Ophthalmic Q4H  . ofloxacin  1 drop Both Eyes QID  . PARoxetine  80 mg Oral Daily  . polyvinyl alcohol  1 drop Right Eye Daily  . DISCONTD: vancomycin  1,000 mg Intravenous Q T,Th,Sa-HD   sodium chloride, sodium chloride, sodium chloride, acetaminophen, acetaminophen, calcium carbonate (dosed in mg elemental calcium), camphor-menthol, docusate sodium, feeding supplement (NEPRO CARB STEADY), heparin, heparin, hydrOXYzine, lidocaine, lidocaine-prilocaine, ondansetron (ZOFRAN) IV, ondansetron, oxyCODONE-acetaminophen, pentafluoroprop-tetrafluoroeth, sorbitol, zolpidem  1. End stage renal disease/volume overload: patient for dialysis tomorrow 2. Superior vena cava syndrome: Difficult situation in this patient who has had challenging HD access situation- Left sided central circulation seems occluded and not amenable to PTA and Right sided IJ/subclavian seem stenotic at length- Family/patient meeting with palliative care team today to set the goals of care  3. Anemia of chronic kidney disease: Hemoglobin currently at goal, we'll restart ESA when hemoglobin starts trending down.  4. Decubiti/Shallow LE ulcers: On empiric IV vancomycin/analgesics, on MRSA contact precautions    LOS: 8 [name] W @TODAY @1 :12 PM

## 2012-04-30 NOTE — Consult Note (Signed)
Wound care follow-up:  Initial wound consult performed on 8/29.  Please refer to assessment in progress notes.  Pt with 2 full thickness wounds to lower sacrum area, .2X.2X.1cm and .2X.2X.2cm.  These are NOT PRESSURE ULCERS.  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  Inner wound beds yellow, erythremia surrounding wounds, very painful to touch. Foul odor and mod tan drainage. Too shallow to pack wounds.  Foam dressing to protect and absorb drainage. RECOMMEND X RAY OR CT SCAN TO R/O ABSCESS which might require drainage.  Pt has had previous back surgery and location is near previous scar tissue.    Right leg full thickness wound greatly improved from previous assessment. .2X.2X.1cm, pink and dry without odor or drainage.  Foam dressing to protect and promote healing.  Cammie Mcgee, RN, MSN, [employer]  [phone]

## 2012-04-30 NOTE — Evaluation (Addendum)
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 Pt will benefit from skilled OT in the acute setting to maximize I with ADL and ADL mobility. CIR consult would be beneficial     OT Assessment  Patient needs continued OT Services    Follow Up Recommendations  Inpatient Rehab    Barriers to Discharge      Equipment Recommendations  None recommended by OT    Recommendations for Other Services Rehab consult  Frequency  Min 2X/week    Precautions / Restrictions Precautions Precautions: Fall   Pertinent Vitals/Pain Pt reports back pain with full trunk extension- did not rate. Repositioned for relief    ADL  Eating/Feeding: Performed;Independent Where Assessed - Eating/Feeding: Chair Grooming: Performed;Wash/dry face;Set up Where Assessed - Grooming: Supported sitting Transfers/Ambulation Related to ADLs: Mod A with stand pivot transfers, cues for upright standing. Ambulation not attempted    OT Diagnosis: Generalized weakness;Acute pain  OT Problem List: Decreased strength;Decreased activity tolerance;Impaired balance (sitting and/or standing);Decreased knowledge of use of DME or AE;Decreased knowledge of precautions;Pain OT Treatment Interventions: Self-care/ADL training;DME and/or AE instruction;Patient/family education;Balance training;Therapeutic activities   OT Goals Acute Rehab OT Goals OT Goal Formulation: With patient Time For Goal Achievement: 05/14/12 Potential to Achieve Goals: Good ADL Goals Pt Will Perform Grooming: with supervision;Standing at sink;Sitting at sink ADL Goal: Grooming - Progress: Goal set today Pt Will Perform Upper Body Dressing: with  set-up;Sitting, bed;Sitting, chair ADL Goal: Upper Body Dressing - Progress: Goal set today Pt Will Perform Lower Body Dressing: with modified independence;with adaptive equipment;Sit to stand from chair;Sit to stand from bed ADL Goal: Lower Body Dressing - Progress: Goal set today Pt Will Transfer to Toilet: with supervision;Stand pivot transfer;with DME ADL Goal: Toilet Transfer - Progress: Goal set today Pt Will Perform Toileting - Clothing Manipulation: with supervision;Standing ADL Goal: Toileting - Clothing Manipulation - Progress: Goal set today Pt Will Perform Tub/Shower Transfer: Tub transfer;Shower seat without back;Ambulation;with DME ADL Goal: Tub/Shower Transfer - Progress: Goal set today  Visit Information  Last OT Received On: 04/30/12 Assistance Needed: +1 (+2 may be helpful for initial ambulation)    Subjective Data  Subjective: I know I need to get stronger Patient Stated Goal: Return home   Prior Functioning  Vision/Perception  Home Living Lives With: Family Available Help at Discharge: Family;Available 24 hours/day Type of Home: House Home Access: Ramped entrance Home Layout: One level Bathroom Shower/Tub: Nurse, adult Accessibility: Yes How Accessible: Accessible via wheelchair;Accessible via walker Home Adaptive Equipment: Shower chair with back;Walker - rolling;Wheelchair - manual Prior Function Level of Independence: Needs assistance Needs Assistance: Bathing;Dressing;Toileting;Meal Prep;Light Housekeeping;Gait;Transfers Driving: No Communication Communication: No difficulties Dominant Hand: Right      Cognition  Overall Cognitive Status: Appears within functional limits for tasks assessed/performed Arousal/Alertness: Awake/alert Orientation Level: Appears intact for tasks assessed Behavior During Session: [facility] for tasks performed    Extremity/Trunk Assessment Right Upper Extremity Assessment RUE ROM/Strength/Tone: Deficits RUE  ROM/Strength/Tone Deficits: grossly 4+/5 and easy to fatigue RUE Coordination: WFL - gross/fine motor Left Upper Extremity Assessment LUE ROM/Strength/Tone: Deficits LUE ROM/Strength/Tone Deficits: grossly 4+/5 and easy to fatigue LUE Coordination: WFL - gross/fine motor Right Lower Extremity Assessment RLE ROM/Strength/Tone: Deficits RLE ROM/Strength/Tone Deficits: Generalized weakness Left Lower Extremity Assessment LLE  ROM/Strength/Tone: Deficits LLE ROM/Strength/Tone Deficits: Generalized weakness   Mobility  Shoulder Instructions  Bed Mobility Bed Mobility:  (presented EOB) Transfers Sit to Stand: 3: Mod assist;With armrests;From chair/3-in-1 Stand to Sit: 4: Min assist;With armrests;To chair/3-in-1 Details for Transfer Assistance: cues for safety and technique; assist to achieve upright standing and encourage released grip from surface stood from       End of Session OT - End of Session Equipment Utilized During Treatment: Gait belt Activity Tolerance: Patient tolerated treatment well Patient left: in chair;with call bell/phone within reach;with family/visitor present Nurse Communication: Mobility status  GO     Camil Hausmann 04/30/2012, 4:49 PM

## 2012-05-01 ENCOUNTER — Inpatient Hospital Stay (HOSPITAL_COMMUNITY): Payer: Medicare Other

## 2012-05-01 LAB — CBC
Hemoglobin: 9.3 g/dL — ABNORMAL LOW (ref 12.0–15.0)
RBC: 3.08 MIL/uL — ABNORMAL LOW (ref 3.87–5.11)

## 2012-05-01 LAB — GLUCOSE, CAPILLARY: Glucose-Capillary: 128 mg/dL — ABNORMAL HIGH (ref 70–99)

## 2012-05-01 LAB — RENAL FUNCTION PANEL
CO2: 25 mEq/L (ref 19–32)
Chloride: 92 mEq/L — ABNORMAL LOW (ref 96–112)
GFR calc Af Amer: 12 mL/min — ABNORMAL LOW (ref >90)
GFR calc non Af Amer: 10 mL/min — ABNORMAL LOW (ref >90)
Sodium: 131 mEq/L — ABNORMAL LOW (ref 135–145)

## 2012-05-01 MED ORDER — OXYCODONE-ACETAMINOPHEN 5-325 MG PO TABS
ORAL_TABLET | ORAL | Status: AC
  Administered 2012-05-01: 2 via ORAL
  Filled 2012-05-01: qty 2

## 2012-05-01 MED ORDER — BUTALBITAL-APAP-CAFFEINE 50-325-40 MG PO TABS
1.0000 | ORAL_TABLET | ORAL | Status: DC | PRN
  Administered 2012-05-01 – 2012-05-05 (×3): 1 via ORAL
  Filled 2012-05-01 (×6): qty 1

## 2012-05-01 MED ORDER — ONDANSETRON HCL 4 MG/2ML IJ SOLN
INTRAMUSCULAR | Status: AC
  Administered 2012-05-01: 4 mg via INTRAVENOUS
  Filled 2012-05-01: qty 2

## 2012-05-01 MED ORDER — ALPRAZOLAM 0.5 MG PO TABS
1.0000 mg | ORAL_TABLET | Freq: Three times a day (TID) | ORAL | Status: DC
  Administered 2012-05-01 – 2012-05-05 (×13): 1 mg via ORAL
  Filled 2012-05-01 (×3): qty 2
  Filled 2012-05-01: qty 1
  Filled 2012-05-01 (×3): qty 2
  Filled 2012-05-01: qty 1
  Filled 2012-05-01 (×2): qty 2
  Filled 2012-05-01: qty 1
  Filled 2012-05-01 (×3): qty 2
  Filled 2012-05-01: qty 1
  Filled 2012-05-01: qty 2

## 2012-05-01 NOTE — Progress Notes (Signed)
[facility] ROUNDING NOTE   Subjective:   Interval History:comfortable on dialysis  Objective:  Vital signs in last 24 hours:  Temp:  [97.4 F (36.3 C)-97.9 F (36.6 C)] 97.4 F (36.3 C) (09/05 0501) Pulse Rate:  [87-104] 104  (09/05 0501) Resp:  [16-18] 18  (09/05 0501) BP: (135-179)/(58-90) 179/87 mmHg (09/05 0501) SpO2:  [95 %-100 %] 100 % (09/05 0501) Weight:  [84.1 kg (185 lb 6.5 oz)] 84.1 kg (185 lb 6.5 oz) (09/05 0501)  Weight change: -4.1 kg (-9 lb 0.6 oz) Filed Weights   04/29/12 1257 04/29/12 2039 05/01/12 0501  Weight: 85.3 kg (188 lb 0.8 oz) 84.414 kg (186 lb 1.6 oz) 84.1 kg (185 lb 6.5 oz)    Intake/Output: I/O last 3 completed shifts: In: 1272 [P.O.:1272] Out: -    Intake/Output this shift:     YNW:GNFAOZHYQMV sleeping in bed- obvious asymmetric facial swelling  HQI:ONGEX RRR, normal S1 and S2  Resp:Coarse BS bilaterally, expiratory wheeze audible  BMW:UXLK, obese, NT, BS normal  GMW:NUUVO LE edema, 1-2+ UE/facial edema persists    Basic Metabolic Panel:  Lab 04/29/12 5366 04/26/12 1020  NA 132* 136  K 3.3* 4.0  CL 91* 95*  CO2 24 22  GLUCOSE 129* 120*  BUN 44* 29*  CREATININE 4.89* 4.23*  CALCIUM 9.1 10.2  MG -- --  PHOS 6.2* 9.4*    Liver Function Tests:  Lab 04/29/12 0925 04/26/12 1020  AST -- --  ALT -- --  ALKPHOS -- --  BILITOT -- --  PROT -- --  ALBUMIN 2.3* 2.5*   No results found for this basename: LIPASE:5,AMYLASE:5 in the last 168 hours No results found for this basename: AMMONIA:3 in the last 168 hours  CBC:  Lab 04/29/12 0925 04/27/12 0525 04/26/12 1020  WBC 9.6 7.8 9.7  NEUTROABS -- -- --  HGB 9.6* 10.1* 10.3*  HCT 30.0* 33.4* 33.4*  MCV 96.2 101.2* 100.0  PLT 238 215 226    Cardiac Enzymes: No results found for this basename: CKTOTAL:5,CKMB:5,CKMBINDEX:5,TROPONINI:5 in the last 168 hours  BNP: No components found with this basename: POCBNP:5  CBG:  Lab 05/01/12 0447 05/01/12 0110  04/30/12 2044 04/30/12 1726 04/30/12 1140  GLUCAP 128* 193* 198* 228* 230*    Microbiology: Results for orders placed during the hospital encounter of 04/22/12  CULTURE, BLOOD (ROUTINE X 2)     Status: Normal   Collection Time   04/22/12  6:10 PM      Component Value Range Status Comment   Specimen Description BLOOD HEMODIALYSIS CATHETER RIGHT   Final    Special Requests BOTTLES DRAWN AEROBIC AND ANAEROBIC 10CC   Final    Culture  Setup Time 04/23/2012 01:29   Final    Culture NO GROWTH 5 DAYS   Final    Report Status 04/29/2012 FINAL   Final   CULTURE, BLOOD (ROUTINE X 2)     Status: Normal   Collection Time   04/22/12  6:15 PM      Component Value Range Status Comment   Specimen Description BLOOD HEMODIALYSIS CATHETER RIGHT   Final    Special Requests BOTTLES DRAWN AEROBIC AND ANAEROBIC 10CC   Final    Culture  Setup Time 04/23/2012 01:29   Final    Culture NO GROWTH 5 DAYS   Final    Report Status 04/29/2012 FINAL   Final   MRSA PCR SCREENING     Status: Abnormal   Collection Time   04/22/12 10:38  PM      Component Value Range Status Comment   MRSA by PCR POSITIVE (*) NEGATIVE Final     Coagulation Studies: No results found for this basename: LABPROT:5,INR:5 in the last 72 hours  Urinalysis: No results found for this basename: COLORURINE:2,APPERANCEUR:2,LABSPEC:2,PHURINE:2,GLUCOSEU:2,HGBUR:2,BILIRUBINUR:2,KETONESUR:2,PROTEINUR:2,UROBILINOGEN:2,NITRITE:2,LEUKOCYTESUR:2 in the last 72 hours    Imaging: No results found.   Medications:        . ALPRAZolam  0.5 mg Oral TID  . ARIPiprazole  10 mg Oral Daily  . cinacalcet  30 mg Oral Q supper  . feeding supplement (NEPRO CARB STEADY)  237 mL Oral BID BM  . heparin subcutaneous  5,000 Units Subcutaneous Q8H  . insulin aspart  0-20 Units Subcutaneous Q4H  . insulin glargine  5 Units Subcutaneous QHS  . metoCLOPramide  5 mg Oral TID AC  . NONFORMULARY OR COMPOUNDED ITEM 1 drop  1 drop Ophthalmic Q4H  . ofloxacin  1 drop  Both Eyes QID  . PARoxetine  80 mg Oral Daily  . polyvinyl alcohol  1 drop Right Eye Daily   sodium chloride, sodium chloride, sodium chloride, acetaminophen, acetaminophen, calcium carbonate (dosed in mg elemental calcium), camphor-menthol, docusate sodium, feeding supplement (NEPRO CARB STEADY), heparin, heparin, heparin, hydrOXYzine, lidocaine, lidocaine-prilocaine, menthol-cetylpyridinium, ondansetron (ZOFRAN) IV, ondansetron, oxyCODONE-acetaminophen, pentafluoroprop-tetrafluoroeth, sorbitol, zolpidem  Assessment/ Plan:  1. End stage renal disease/volume overload: patient for dialysis today 2. Superior vena cava syndrome: Difficult situation in this patient who has had challenging HD access situation- Left sided central circulation seems occluded and not amenable to PTA and Right sided IJ/subclavian seem stenotic at length- Family/patient meeting with palliative care team today to set the goals of care  3. Anemia of chronic kidney disease: Hemoglobin currently at goal, we'll restart ESA when hemoglobin starts trending down.  4. Decubiti/Shallow LE ulcers: On empiric IV vancomycin/analgesics, on MRSA contact precautions.   LOS: 9 [name] W @TODAY @7 :58 AM

## 2012-05-01 NOTE — Procedures (Signed)
I have seen and examined this patient and agree with the plan of care , comfortable on dialysis .  Myana Schlup W 05/01/2012, 8:01 AM

## 2012-05-01 NOTE — Progress Notes (Signed)
Off unit.

## 2012-05-01 NOTE — Progress Notes (Signed)
Patient ID: [name], female   DOB: 03-05-1971, 41 y.o.   MRN: [id number]  [facility] PROGRESS NOTE  [name] GNF:[id number] DOB: [birth year] DOA: 04/22/2012 PCP: [name] Conrad, MD  Brief narrative:  41 yo F ESRD on HD tue/thu/sat, difficult access, last HD 8/22 , recent aortobifemoral bypass surgery ([facility]) presented 8/27 with gradually increasing swelling of face & BUEs, erythematous papular rash over upper extremities & ears, gen weakness & worse wounds over buttocks. Required emergent HD for hyperkalemia. PCCM admitted.   Principal Problem:  *SVC syndrome  - vascular team following  - pt with very difficult access and for that reason palliative care consult obatained  - last HD 09/03   Active Problems:  HYPERTENSION, UNSPECIFIED  - stable during the hospital stay  - continue to monitor vitals per floor protocol   DVT  - on sub Q heparin for prophylaxis   ESRD on dialysis  - managed by nephrology team   DM (diabetes mellitus), type 2 with complications  - continue Lantus as per home medication regimen   Ulcers of both lower extremities  - completed treatment with Vancomycin and no off all antibiotics  - also ? Wound on the back area, sacral area - will obtain CT abdomen and pelvis for further evaluation  Hyperkalemia  - now slightly low  - will have nephrology team readjust the dialysis medium with K if needed  - renal panel in AM   Acute respiratory failure  - now improved and pt denies SOB   Anemia of chronic disease  - secondary to ESRD - Hg and Hct are stable and at pt's baseline  - CBC in AM   Consultants:  PCCM - signed off  Nephrology  Procedures/Studies:  RIJ permacath 7/22 exchanged 8/2 with SVC angioplasty (IR) exchanged 8/28  08/28 angiogram (OR) --> "functional SVC occlusion" due to HD cath.  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  #2 diffusely narrowed right internal jugular vein, right subclavian vein, and superior  vena cava to approximately 5 mm lumen  #3 23 cm Diatek catheter with tip in right atrium   Antibiotics:  Vancomycin 08/27 - 09/02  Code Status: Full  Family Communication: Pt at bedside  Disposition Plan: SNF  HPI/Subjective: No events overnight.   Objective: Filed Vitals:   05/01/12 0942 05/01/12 1104 05/01/12 1259 05/01/12 1638  BP: 133/64 138/68 128/69 122/63  Pulse: 82 89 94 89  Temp: 96 F (35.6 C) 97.9 F (36.6 C) 98.1 F (36.7 C) 98.6 F (37 C)  TempSrc: Oral     Resp: 16 17 18 17   Height:      Weight: 83.5 kg (184 lb 1.4 oz)     SpO2: 96% 97% 98% 98%    Intake/Output Summary (Last 24 hours) at 05/01/12 1850 Last data filed at 05/01/12 1830  Gross per 24 hour  Intake   1812 ml  Output   1900 ml  Net    -88 ml    Exam:   General:  Pt is alert, follows commands appropriately, not in acute distress  Cardiovascular: Regular rate and rhythm but distant heart sounds and mostly from body habitus  Respiratory: Diminished breath sounds bilaterally from body habitus mainly  Abdomen: Soft, non tender, non distended, bowel sounds present, no guarding  Extremities: Bilateral lower extremity pitting edema, pulses DP and PT palpable bilaterally  Neuro: Grossly nonfocal  Data Reviewed: Basic Metabolic Panel:  Lab 05/01/12 8469 04/29/12 0925 04/26/12 1020  NA 131* 132* 136  K 3.7 3.3* 4.0  CL 92* 91* 95*  CO2 25 24 22   GLUCOSE 187* 129* 120*  BUN 52* 44* 29*  CREATININE 4.89* 4.89* 4.23*  CALCIUM 9.7 9.1 10.2  MG -- -- --  PHOS 5.2* 6.2* 9.4*   CBC:  Lab 05/01/12 0500 04/29/12 0925 04/27/12 0525 04/26/12 1020  WBC 9.5 9.6 7.8 9.7  NEUTROABS -- -- -- --  HGB 9.3* 9.6* 10.1* 10.3*  HCT 30.1* 30.0* 33.4* 33.4*  MCV 97.7 96.2 101.2* 100.0  PLT 243 238 215 226   CBG:  Lab 05/01/12 1639 05/01/12 1104 05/01/12 0447 05/01/12 0110 04/30/12 2044  GLUCAP 273* 161* 128* 193* 198*    Recent Results (from the past 240 hour(s))  CULTURE, BLOOD (ROUTINE X 2)      Status: Normal   Collection Time   04/22/12  6:10 PM      Component Value Range Status Comment   Specimen Description BLOOD HEMODIALYSIS CATHETER RIGHT   Final    Special Requests BOTTLES DRAWN AEROBIC AND ANAEROBIC 10CC   Final    Culture  Setup Time 04/23/2012 01:29   Final    Culture NO GROWTH 5 DAYS   Final    Report Status 04/29/2012 FINAL   Final   CULTURE, BLOOD (ROUTINE X 2)     Status: Normal   Collection Time   04/22/12  6:15 PM      Component Value Range Status Comment   Specimen Description BLOOD HEMODIALYSIS CATHETER RIGHT   Final    Special Requests BOTTLES DRAWN AEROBIC AND ANAEROBIC 10CC   Final    Culture  Setup Time 04/23/2012 01:29   Final    Culture NO GROWTH 5 DAYS   Final    Report Status 04/29/2012 FINAL   Final   MRSA PCR SCREENING     Status: Abnormal   Collection Time   04/22/12 10:38 PM      Component Value Range Status Comment   MRSA by PCR POSITIVE (*) NEGATIVE Final      Scheduled Meds:   . ALPRAZolam  1 mg Oral TID  . ARIPiprazole  10 mg Oral Daily  . cinacalcet  30 mg Oral Q supper  . feeding supplement   237 mL Oral BID BM  . heparin subcutaneous  5,000 Units Subcutaneous Q8H  . insulin aspart  0-20 Units Subcutaneous Q4H  . insulin glargine  5 Units Subcutaneous QHS  . metoCLOPramide  5 mg Oral TID AC  . ofloxacin  1 drop Both Eyes QID  . PARoxetine  80 mg Oral Daily   Continuous Infusions:    [name] Presto, MD  [facility] Pager [phone]  If 7PM-7AM, please contact night-coverage [email or web address] Password [email or web address] 05/01/2012, 6:50 PM   LOS: 9 days

## 2012-05-02 ENCOUNTER — Inpatient Hospital Stay (HOSPITAL_COMMUNITY): Payer: Medicare Other

## 2012-05-02 ENCOUNTER — Encounter (HOSPITAL_COMMUNITY): Payer: Self-pay | Admitting: Radiology

## 2012-05-02 LAB — RENAL FUNCTION PANEL
CO2: 29 mEq/L (ref 19–32)
Calcium: 9.8 mg/dL (ref 8.4–10.5)
Creatinine, Ser: 4.41 mg/dL — ABNORMAL HIGH (ref 0.50–1.10)
GFR calc non Af Amer: 11 mL/min — ABNORMAL LOW (ref >90)
Glucose, Bld: 287 mg/dL — ABNORMAL HIGH (ref 70–99)

## 2012-05-02 LAB — CBC
MCH: 30.6 pg (ref 26.0–34.0)
MCV: 99.4 fL (ref 78.0–100.0)
Platelets: 314 10*3/uL (ref 150–400)
RBC: 3.2 MIL/uL — ABNORMAL LOW (ref 3.87–5.11)

## 2012-05-02 LAB — GLUCOSE, CAPILLARY
Glucose-Capillary: 288 mg/dL — ABNORMAL HIGH (ref 70–99)
Glucose-Capillary: 228 mg/dL — ABNORMAL HIGH (ref 70–99)
Glucose-Capillary: 181 mg/dL — ABNORMAL HIGH (ref 70–99)

## 2012-05-02 IMAGING — CR DG CHEST 2V
2 series · 2 of 2 positions shown · non-contrast
Comparison: Chest x-ray of 02/02/2011

CLINICAL DATA: Altered mental status, cough

CHEST - 2 VIEW

[view not recorded (1 of 2)]
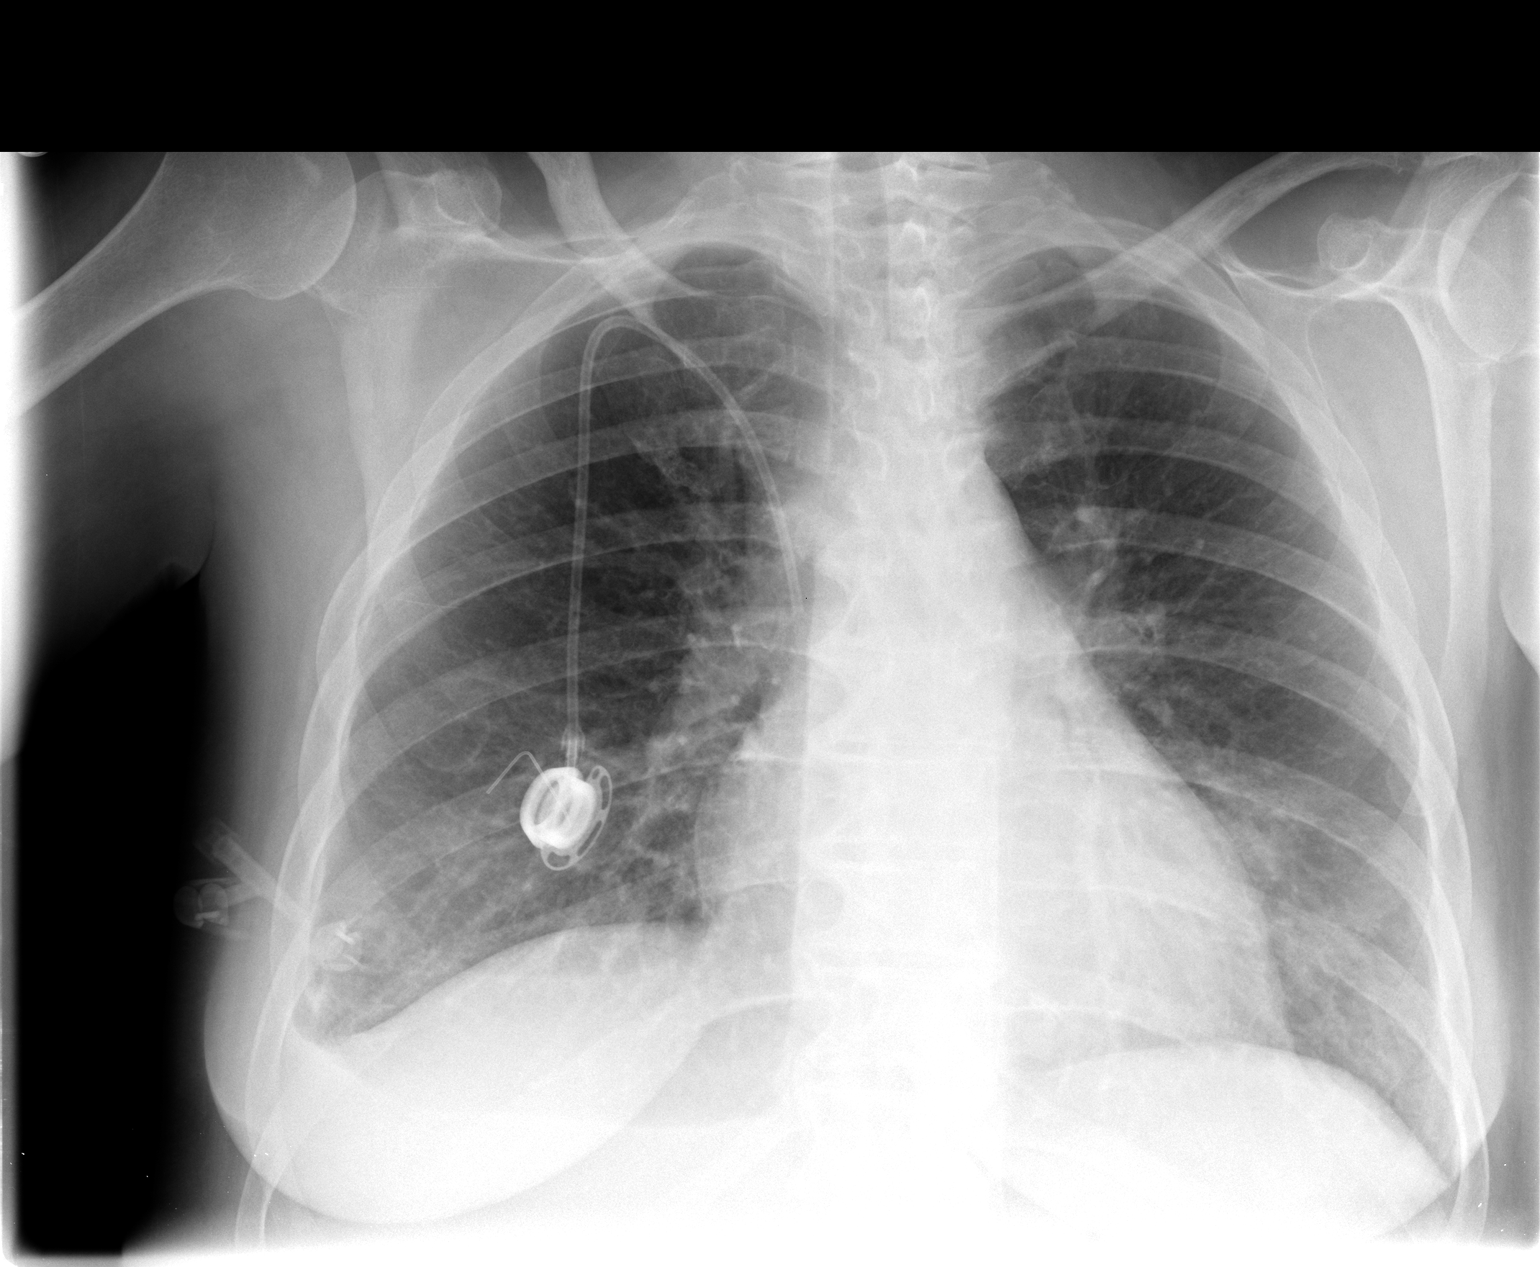

[view not recorded (2 of 2)]
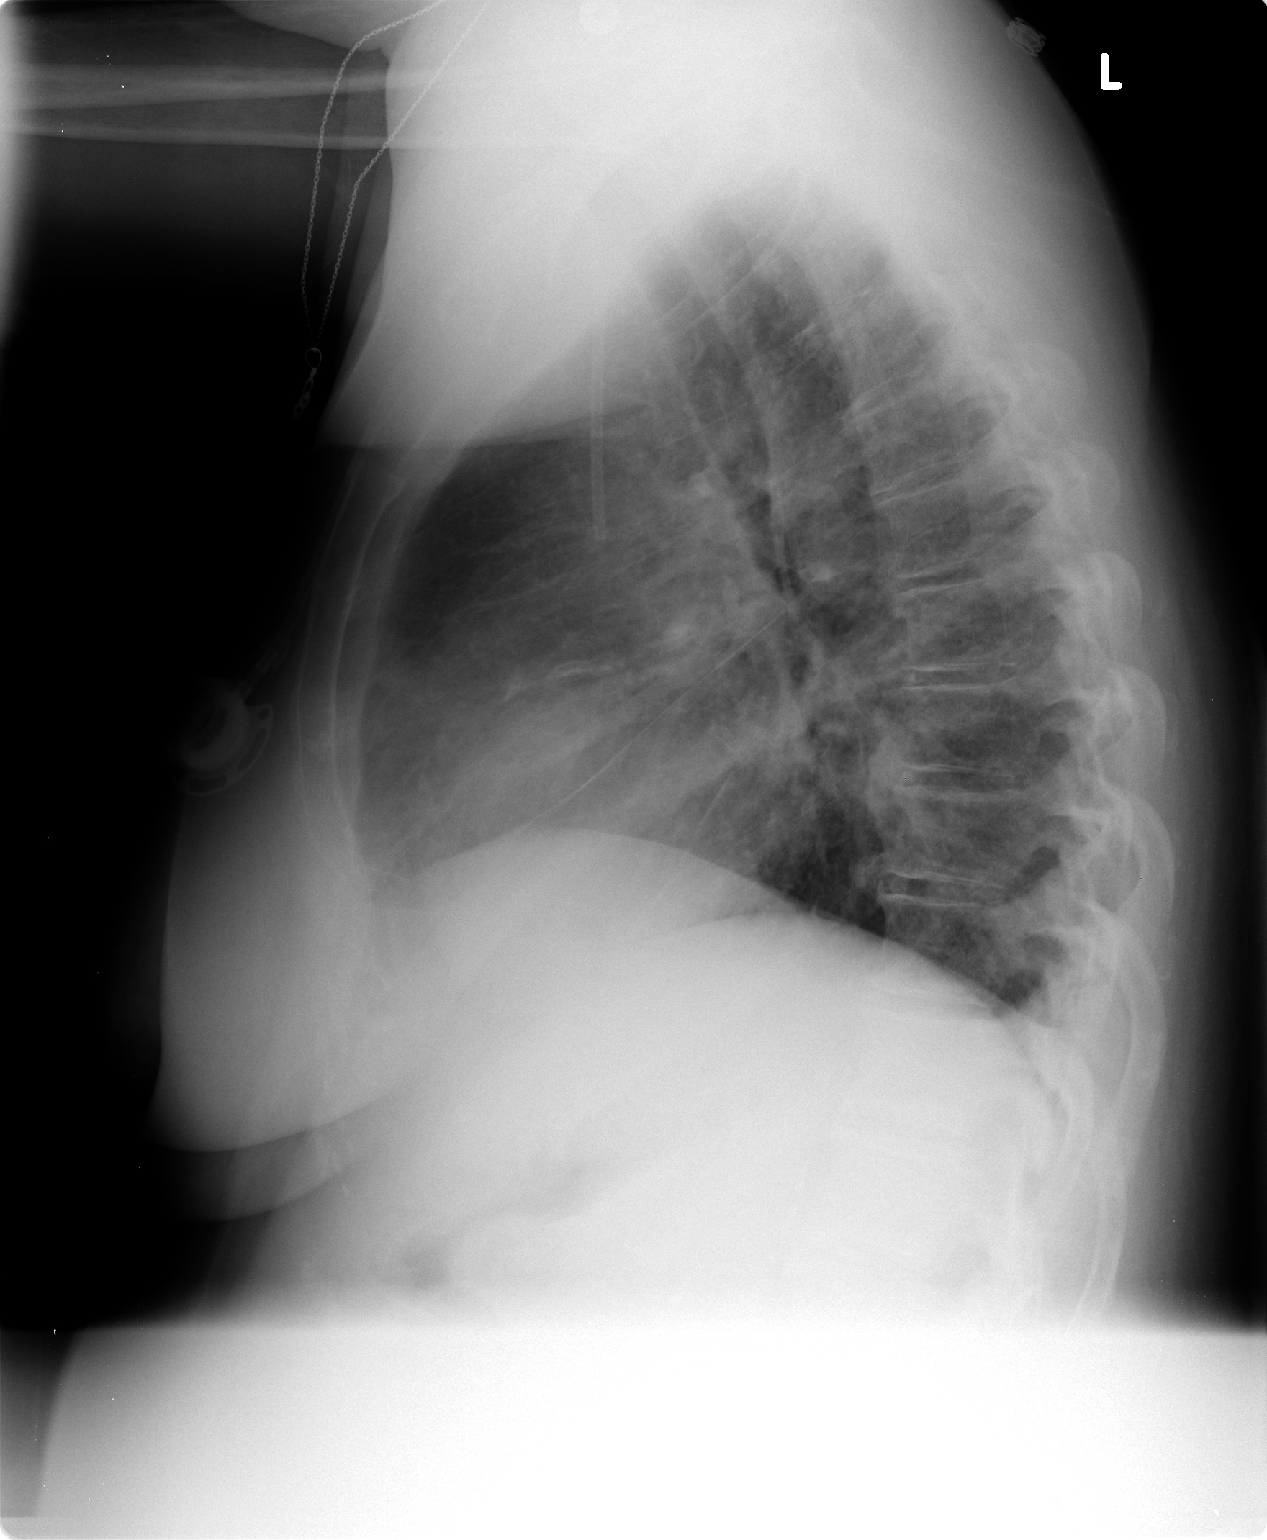

[2 of 2 positions shown; findings below may reference images not displayed]

FINDINGS: There is no change in blunting of the right costophrenic
angle either due to pleural effusion or pleural thickening.  No
infiltrate is seen.  Mediastinal contours are stable.  Right-sided
Port-A-Cath remains with the tip in the SVC.  Mild cardiomegaly is
stable.  There are degenerative changes in the lower thoracic
spine.
IMPRESSION: Stable cardiomegaly.  Stable blunting of the right costophrenic due
to small right effusion or pleural thickening.

## 2012-05-02 MED ORDER — HEPARIN SODIUM (PORCINE) 1000 UNIT/ML DIALYSIS
20.0000 [IU]/kg | INTRAMUSCULAR | Status: DC | PRN
  Administered 2012-05-03: 1700 [IU] via INTRAVENOUS_CENTRAL
  Filled 2012-05-02: qty 2

## 2012-05-02 NOTE — Progress Notes (Signed)
Patient ID: [name], female   DOB: 10-28-1970, 41 y.o.   MRN: [id number]  [facility] PROGRESS NOTE  KRISTIAN HAZZARD [id number] DOB: 07-Jun-1971 DOA: 04/22/2012 PCP: Ernestine Conrad, MD  Brief narrative:  41 yo F ESRD on HD tue/thu/sat, difficult access, last HD 8/22 , recent aortobifemoral bypass surgery ([facility]) presented 8/27 with gradually increasing swelling of face & BUEs, erythematous papular rash over upper extremities & ears, gen weakness & worse wounds over buttocks. Required emergent HD for hyperkalemia. PCCM admitted.   Principal Problem:  *SVC syndrome  - vascular team following  - pt with very difficult access and for that reason palliative care consult obatained  - last HD 09/03   Active Problems:  HYPERTENSION, UNSPECIFIED  - stable during the hospital stay  - continue to monitor vitals per floor protocol   DVT  - on sub Q heparin for prophylaxis   ESRD on dialysis  - managed by nephrology team   DM (diabetes mellitus), type 2 with complications  - continue Lantus as per home medication regimen   Ulcers of both lower extremities  - completed treatment with Vancomycin and no off all antibiotics  - also ? Wound on the back area, sacral area  - will follow up on CT abdomen and pelvis for further evaluation   Hyperkalemia  - now slightly low  - will have nephrology team readjust the dialysis medium with K if needed  - renal panel in AM   Acute respiratory failure  - now improved and pt denies SOB   Anemia of chronic disease  - secondary to ESRD - Hg and Hct are stable and at pt's baseline  - CBC in AM   Consultants:  PCCM - signed off  Nephrology  Procedures/Studies:  RIJ permacath 7/22 exchanged 8/2 with SVC angioplasty (IR) exchanged 8/28  08/28 angiogram (OR) --> "functional SVC occlusion" due to HD cath.  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  #2 diffusely narrowed right internal jugular vein, right subclavian vein, and  superior vena cava to approximately 5 mm lumen  #3 23 cm Diatek catheter with tip in right atrium   Antibiotics:  Vancomycin 08/27 - 09/02  Code Status: Full  Family Communication: Pt at bedside  Disposition Plan: SNF  HPI/Subjective: No events overnight.   Objective: Filed Vitals:   05/02/12 0555 05/02/12 1400 05/02/12 1806 05/02/12 2105  BP: 135/71 150/86 143/81 140/79  Pulse: 90 91 93 87  Temp: 98.1 F (36.7 C) 97.5 F (36.4 C) 98 F (36.7 C) 98.1 F (36.7 C)  TempSrc: Oral Oral Oral Oral  Resp: 17 16 16 16   Height:    5\' 2"  (1.575 m)  Weight:    83.5 kg (184 lb 1.4 oz)  SpO2: 97% 99% 100% 96%    Intake/Output Summary (Last 24 hours) at 05/02/12 2215 Last data filed at 05/02/12 2110  Gross per 24 hour  Intake   2145 ml  Output      0 ml  Net   2145 ml    Exam:   General:  Pt is alert, follows commands appropriately, not in acute distress  Cardiovascular: Regular rate and rhythm, S1/S2, no murmurs, no rubs, no gallops  Respiratory: Clear to auscultation bilaterally, no wheezing, no crackles, no rhonchi  Abdomen: Soft, non tender, non distended, bowel sounds present, no guarding  Extremities: Trace bilateral lower extremity edema, pulses DP and PT palpable bilaterally  Neuro: Grossly nonfocal  Data Reviewed: Basic Metabolic Panel:  Lab 05/02/12 0520 05/01/12 0500 04/29/12 0925 04/26/12 1020  NA 134* 131* 132* 136  K 4.0 3.7 3.3* 4.0  CL 91* 92* 91* 95*  CO2 29 25 24 22   GLUCOSE 287* 187* 129* 120*  BUN 45* 52* 44* 29*  CREATININE 4.41* 4.89* 4.89* 4.23*  CALCIUM 9.8 9.7 9.1 10.2  MG -- -- -- --  PHOS 4.7* 5.2* 6.2* 9.4*   Liver Function Tests:  Lab 05/02/12 0520 05/01/12 0500 04/29/12 0925 04/26/12 1020  AST -- -- -- --  ALT -- -- -- --  ALKPHOS -- -- -- --  BILITOT -- -- -- --  PROT -- -- -- --  ALBUMIN 2.7* 2.3* 2.3* 2.5*   CBC:  Lab 05/02/12 0520 05/01/12 0500 04/29/12 0925 04/27/12 0525 04/26/12 1020  WBC 9.2 9.5 9.6 7.8 9.7    NEUTROABS -- -- -- -- --  HGB 9.8* 9.3* 9.6* 10.1* 10.3*  HCT 31.8* 30.1* 30.0* 33.4* 33.4*  MCV 99.4 97.7 96.2 101.2* 100.0  PLT 314 243 238 215 226   CBG:  Lab 05/02/12 2126 05/02/12 1717 05/02/12 1226 05/02/12 0737 05/01/12 2132  GLUCAP 181* 228* 288* 197* 175*    Recent Results (from the past 240 hour(s))  MRSA PCR SCREENING     Status: Abnormal   Collection Time   04/22/12 10:38 PM      Component Value Range Status Comment   MRSA by PCR POSITIVE (*) NEGATIVE Final      Scheduled Meds:   . ALPRAZolam  1 mg Oral TID  . ARIPiprazole  10 mg Oral Daily  . cinacalcet  30 mg Oral Q supper  . feeding supplement (NEPRO CARB STEADY)  237 mL Oral BID BM  . heparin subcutaneous  5,000 Units Subcutaneous Q8H  . insulin aspart  0-20 Units Subcutaneous Q4H  . insulin glargine  5 Units Subcutaneous QHS  . metoCLOPramide  5 mg Oral TID AC  . NONFORMULARY OR COMPOUNDED ITEM 1 drop  1 drop Ophthalmic Q4H  . ofloxacin  1 drop Both Eyes QID  . PARoxetine  80 mg Oral Daily  . polyvinyl alcohol  1 drop Right Eye Daily   Continuous Infusions:    Debbora Presto, MD  [facility] Pager [phone]  If 7PM-7AM, please contact night-coverage [email or web address] Password [email or web address] 05/02/2012, 10:15 PM   LOS: 10 days

## 2012-05-02 NOTE — Progress Notes (Signed)
Physical Therapy Treatment Patient Details Name: [name] MRN: [id number] DOB: March 10, 1971 Today's Date: 05/02/2012 Time: 4098-1191 PT Time Calculation (min): 23 min  PT Assessment / Plan / Recommendation Comments on Treatment Session  Pt. able to increase her mobility somewhat today with PT, though remains weak and deconditioned.  She has limited DF in right foot which affects her gait.    Follow Up Recommendations  Inpatient Rehab    Barriers to Discharge        Equipment Recommendations  None recommended by PT    Recommendations for Other Services Rehab consult  Frequency Min 3X/week   Plan Discharge plan remains appropriate    Precautions / Restrictions Precautions Precautions: Fall Restrictions Weight Bearing Restrictions: No   Pertinent Vitals/Pain No distress; pt. Requested pain med for right leg, back and stomach and RN was notified.  SHe rated pain at 8/10 in all 3 areas.    Mobility  Bed Mobility Bed Mobility: Supine to Sit Supine to Sit: 6: Modified independent (Device/Increase time);With rails Details for Bed Mobility Assistance: managing own her own well Transfers Transfers: Sit to Stand;Stand to Sit Sit to Stand: 4: Min assist;With upper extremity assist;From bed Stand to Sit: 4: Min assist;With armrests;To chair/3-in-1 Details for Transfer Assistance: cues for safety and technique Ambulation/Gait Ambulation/Gait Assistance: 4: Min assist Ambulation Distance (Feet): 25 Feet Assistive device: Rolling walker Ambulation/Gait Assistance Details: safety cues for managing RW Gait Pattern: Shuffle;Decreased dorsiflexion - right;Trunk flexed    Exercises General Exercises - Lower Extremity Ankle Circles/Pumps: AROM;20 reps;Both;Seated Long Arc Quad: AROM;20 reps;Seated Hip Flexion/Marching: AROM;10 reps;Both;Seated   PT Diagnosis:    PT Problem List:   PT Treatment Interventions:     PT Goals Acute Rehab PT Goals PT Goal: Supine/Side to Sit -  Progress: Met PT Goal: Sit to Stand - Progress: Progressing toward goal PT Goal: Stand to Sit - Progress: Progressing toward goal PT Goal: Ambulate - Progress: Progressing toward goal  Visit Information  Last PT Received On: 05/02/12 Assistance Needed: +1    Subjective Data  Subjective: "I need my pain medicine"   Cognition  Overall Cognitive Status: Appears within functional limits for tasks assessed/performed Arousal/Alertness: Awake/alert Orientation Level: Appears intact for tasks assessed Behavior During Session: [facility] for tasks performed    Balance     End of Session PT - End of Session Equipment Utilized During Treatment: Gait belt Activity Tolerance: Patient limited by fatigue Patient left: in chair;with call bell/phone within reach Nurse Communication: Mobility status   GP     Ferman Hamming 05/02/2012, 10:55 AM Weldon Picking PT Acute Rehab Services [phone] Beeper [phone]

## 2012-05-02 NOTE — Progress Notes (Signed)
[facility] ROUNDING NOTE   Subjective:   Interval History: awaiting SNF  Objective:  Vital signs in last 24 hours:  Temp:  [98.1 F (36.7 C)-98.6 F (37 C)] 98.1 F (36.7 C) (09/06 0555) Pulse Rate:  [89-95] 90  (09/06 0555) Resp:  [17-18] 17  (09/06 0555) BP: (122-135)/(63-71) 135/71 mmHg (09/06 0555) SpO2:  [97 %-98 %] 97 % (09/06 0555) Weight:  [83.5 kg (184 lb 1.4 oz)] 83.5 kg (184 lb 1.4 oz) (09/05 2134)  Weight change: 0.2 kg (7.1 oz) Filed Weights   05/01/12 0725 05/01/12 0942 05/01/12 2134  Weight: 84.3 kg (185 lb 13.6 oz) 83.5 kg (184 lb 1.4 oz) 83.5 kg (184 lb 1.4 oz)    Intake/Output: I/O last 3 completed shifts: In: 3540 [P.O.:3540] Out: 1900 [Other:1900]   Intake/Output this shift:  Total I/O In: 75 [P.O.:75] Out: -   ZOX:WRUEAVWUJWJ sleeping in bed- obvious asymmetric facial swelling  XBJ:YNWGN RRR, normal S1 and S2  Resp:Coarse BS bilaterally, expiratory wheeze audible  FAO:ZHYQ, obese, NT, BS normal  MVH:QIONG LE edema, 1-2+ UE/facial edema persists    Basic Metabolic Panel:  Lab 05/02/12 2952 05/01/12 0500 04/29/12 0925 04/26/12 1020  NA 134* 131* 132* 136  K 4.0 3.7 3.3* 4.0  CL 91* 92* 91* 95*  CO2 29 25 24 22   GLUCOSE 287* 187* 129* 120*  BUN 45* 52* 44* 29*  CREATININE 4.41* 4.89* 4.89* 4.23*  CALCIUM 9.8 9.7 9.1 --  MG -- -- -- --  PHOS 4.7* 5.2* 6.2* 9.4*    Liver Function Tests:  Lab 05/02/12 0520 05/01/12 0500 04/29/12 0925 04/26/12 1020  AST -- -- -- --  ALT -- -- -- --  ALKPHOS -- -- -- --  BILITOT -- -- -- --  PROT -- -- -- --  ALBUMIN 2.7* 2.3* 2.3* 2.5*   No results found for this basename: LIPASE:5,AMYLASE:5 in the last 168 hours No results found for this basename: AMMONIA:3 in the last 168 hours  CBC:  Lab 05/02/12 0520 05/01/12 0500 04/29/12 0925 04/27/12 0525 04/26/12 1020  WBC 9.2 9.5 9.6 7.8 9.7  NEUTROABS -- -- -- -- --  HGB 9.8* 9.3* 9.6* 10.1* 10.3*  HCT 31.8* 30.1* 30.0* 33.4* 33.4*    MCV 99.4 97.7 96.2 101.2* 100.0  PLT 314 243 238 215 226    Cardiac Enzymes: No results found for this basename: CKTOTAL:5,CKMB:5,CKMBINDEX:5,TROPONINI:5 in the last 168 hours  BNP: No components found with this basename: POCBNP:5  CBG:  Lab 05/02/12 1226 05/02/12 0737 05/01/12 2132 05/01/12 1639 05/01/12 1104  GLUCAP 288* 197* 175* 273* 161*    Microbiology: Results for orders placed during the hospital encounter of 04/22/12  CULTURE, BLOOD (ROUTINE X 2)     Status: Normal   Collection Time   04/22/12  6:10 PM      Component Value Range Status Comment   Specimen Description BLOOD HEMODIALYSIS CATHETER RIGHT   Final    Special Requests BOTTLES DRAWN AEROBIC AND ANAEROBIC 10CC   Final    Culture  Setup Time 04/23/2012 01:29   Final    Culture NO GROWTH 5 DAYS   Final    Report Status 04/29/2012 FINAL   Final   CULTURE, BLOOD (ROUTINE X 2)     Status: Normal   Collection Time   04/22/12  6:15 PM      Component Value Range Status Comment   Specimen Description BLOOD HEMODIALYSIS CATHETER RIGHT   Final    Special Requests  BOTTLES DRAWN AEROBIC AND ANAEROBIC 10CC   Final    Culture  Setup Time 04/23/2012 01:29   Final    Culture NO GROWTH 5 DAYS   Final    Report Status 04/29/2012 FINAL   Final   MRSA PCR SCREENING     Status: Abnormal   Collection Time   04/22/12 10:38 PM      Component Value Range Status Comment   MRSA by PCR POSITIVE (*) NEGATIVE Final     Coagulation Studies: No results found for this basename: LABPROT:5,INR:5 in the last 72 hours  Urinalysis: No results found for this basename: COLORURINE:2,APPERANCEUR:2,LABSPEC:2,PHURINE:2,GLUCOSEU:2,HGBUR:2,BILIRUBINUR:2,KETONESUR:2,PROTEINUR:2,UROBILINOGEN:2,NITRITE:2,LEUKOCYTESUR:2 in the last 72 hours    Imaging: Ct Abdomen Pelvis Wo Contrast  05/02/2012  *RADIOLOGY REPORT*  Clinical Data: Low pelvic pain.  Sacral decubitus ulcer. Renal failure.  Question abscess.  CT ABDOMEN AND PELVIS WITHOUT CONTRAST   Technique:  Multidetector CT imaging of the abdomen and pelvis was performed following the standard protocol without intravenous contrast.  Comparison: Abdominal pelvic CT 03/28/2009.  Findings: There are small bilateral pleural effusions.  There is mild atelectasis at both lung bases.  There are multiple calcifications within the subcutaneous fat of both buttocks and the anterior abdominal wall, presumably secondary to injections.  There is asymmetric soft tissue stranding within the subcutaneous fat of the left anterior abdominal wall extending superiorly into the left breast.  No focal fluid collection is evident.  There is minimal subcutaneous edema within the right buttock.  There is no obvious sacral decubitus ulcer or associated fluid collection.  There is no evidence of sacral or ischial bone destruction.  There are postsurgical changes status post L2-L5 fusion.  The hardware appears intact without loosening.  As evaluated in the noncontrast state, the liver and spleen appear normal.  The pancreas is diffusely atrophied.  There is stable mild biliary dilatation status post cholecystectomy.  There are stable bilateral adrenal nodules.  The larger nodule on the left is associated with macroscopic fat and most consistent with a myelolipoma.  There has been interval progression in bilateral renal cortical atrophy.  No hydronephrosis is seen.  There is extensive atherosclerosis with involvement of the coronary arteries, aorta and visceral arteries.  The patient is status post interval aorto-iliac bypass.  There is no evidence of retroperitoneal hematoma.  There is no evidence of bowel obstruction, adenopathy or extraluminal fluid collection.  The uterus, ovaries and bladder appear normal.  IMPRESSION:  1.  No obvious sacral decubitus ulcer, associated abscess or osteomyelitis.  Correlate clinically. 2.  There is extensive asymmetric subcutaneous edema throughout the left anterior abdominal wall extending into the  breast.  This may reflect cellulitis. 3.  Stable left adrenal myelolipoma. 4.  Severe atherosclerosis with progressive bilateral renal atrophy.  Interval aortobi-iliac grafting.   Original Report Authenticated By: Gerrianne Scale, M.D.      Medications:        . ALPRAZolam  1 mg Oral TID  . ARIPiprazole  10 mg Oral Daily  . cinacalcet  30 mg Oral Q supper  . feeding supplement (NEPRO CARB STEADY)  237 mL Oral BID BM  . heparin subcutaneous  5,000 Units Subcutaneous Q8H  . insulin aspart  0-20 Units Subcutaneous Q4H  . insulin glargine  5 Units Subcutaneous QHS  . metoCLOPramide  5 mg Oral TID AC  . NONFORMULARY OR COMPOUNDED ITEM 1 drop  1 drop Ophthalmic Q4H  . ofloxacin  1 drop Both Eyes QID  . PARoxetine  80 mg  Oral Daily  . polyvinyl alcohol  1 drop Right Eye Daily  . DISCONTD: ALPRAZolam  0.5 mg Oral TID   sodium chloride, sodium chloride, sodium chloride, acetaminophen, acetaminophen, butalbital-acetaminophen-caffeine, calcium carbonate (dosed in mg elemental calcium), camphor-menthol, docusate sodium, feeding supplement (NEPRO CARB STEADY), heparin, heparin, heparin, hydrOXYzine, lidocaine, lidocaine-prilocaine, menthol-cetylpyridinium, ondansetron (ZOFRAN) IV, ondansetron, oxyCODONE-acetaminophen, pentafluoroprop-tetrafluoroeth, sorbitol, zolpidem  Assessment/ Plan:  1. End stage renal disease/volume overload: patient for dialysis tomorrow 2. Superior vena cava syndrome: Difficult situation in this patient who has had challenging HD access situation- Left sided central circulation seems occluded and not amenable to PTA and Right sided IJ/subclavian seem stenotic at length- Family/patient meeting with palliative care team today to set the goals of care  3. Anemia of chronic kidney disease: Hemoglobin currently at goal, we'll restart ESA when hemoglobin starts trending down.  4. Decubiti/Shallow LE ulcers: On empiric IV vancomycin/analgesics, on MRSA contact precautions.   LOS:  10 Viera Okonski W @TODAY @12 :58 PM

## 2012-05-02 NOTE — Clinical Social Work Note (Signed)
CSW visited room at patient's sister's request and asked patient if she and mother could speak with CSW privately and patient gave permission.  Patient's mother Corrie Dandy and sister [name] assist in patient's care and are very concerned about her present health and noncompliance with HD. They told patient MD that they could not provide care at discharge, but wants patient to return home at discharge. They are hoping that the thought of going to a facility, even short-term will prompt patient to do the things medically she needs to do to prolong her life. Mother and sister requested that patient not be advised until she is ready for discharge and they will be present for that conversation.  CSW will advise family when patient is medically ready for discharge.  Genelle Bal, MSW, LCSW [phone]

## 2012-05-02 NOTE — Progress Notes (Signed)
Inpatient Diabetes Program Recommendations  AACE/ADA: New Consensus Statement on Inpatient Glycemic Control (2013)  Target Ranges:  Prepandial:   less than 140 mg/dL      Peak postprandial:   less than 180 mg/dL (1-2 hours)      Critically ill patients:  140 - 180 mg/dL    Inpatient Diabetes Program Recommendations Insulin - Basal: Increase Lantus to 10 units   Thank you  Piedad Climes RN,BSN,CDE Inpatient Diabetes Coordinator [phone] (team pager)

## 2012-05-02 NOTE — Clinical Social Work Psychosocial (Addendum)
    Clinical Social Work Department BRIEF PSYCHOSOCIAL ASSESSMENT 05/02/2012  Patient:  [name], [name]     Account Number:  [id number]     Admit date:  04/22/2012  Clinical Social Worker:  Delmer Islam  Date/Time:  05/02/2012 08:45 AM  Referred by:  Physician  Date Referred:  05/01/2012 Referred for  SNF Placement   Other Referral:   Interview type:  Patient on 05/01/12 Other interview type:    PSYCHOSOCIAL DATA Living Status:  FAMILY Admitted from facility:   Level of care:   Primary support name:  Mary Primary support relationship to patient:  PARENT Degree of support available:   Patient lives with mother and sister [name]. Both mother and sister assist in taking care of patient and transporting her to dialysis.  Mother's phone number - [phone] (h)  Sister's phone number [phone] (c)    CURRENT CONCERNS Current Concerns  Post-Acute Placement   Other Concerns:    SOCIAL WORK ASSESSMENT / PLAN On 05/01/12 CSW introduced self and explained reason for visit - discharge planning and MD recommendation of ST rehab. Patient's is in agreement and gave her facility preferences as [facility] in [place] and [place].    Patient was able to give CSW information and history regarding her bi-polar disorder and the treatment she receives.   Assessment/plan status:  Psychosocial Support/Ongoing Assessment of Needs Other assessment/ plan:   Information/referral to community resources:   SNF list for [facility] given on 05/01/12    PATIENT'S/FAMILY'S RESPONSE TO PLAN OF CARE: Patient was very pleasant and was expecting CSW's visit. She was able to express understanding of her need for SNF placement.

## 2012-05-03 ENCOUNTER — Inpatient Hospital Stay (HOSPITAL_COMMUNITY): Payer: Medicare Other

## 2012-05-03 LAB — CBC
HCT: 29.8 % — ABNORMAL LOW (ref 36.0–46.0)
Hemoglobin: 9.3 g/dL — ABNORMAL LOW (ref 12.0–15.0)
MCH: 31 pg (ref 26.0–34.0)
MCHC: 31.2 g/dL (ref 30.0–36.0)
RBC: 3 MIL/uL — ABNORMAL LOW (ref 3.87–5.11)

## 2012-05-03 LAB — RENAL FUNCTION PANEL
BUN: 64 mg/dL — ABNORMAL HIGH (ref 6–23)
CO2: 28 mEq/L (ref 19–32)
Calcium: 9.6 mg/dL (ref 8.4–10.5)
GFR calc Af Amer: 9 mL/min — ABNORMAL LOW (ref >90)
Glucose, Bld: 187 mg/dL — ABNORMAL HIGH (ref 70–99)
Phosphorus: 5.6 mg/dL — ABNORMAL HIGH (ref 2.3–4.6)

## 2012-05-03 LAB — GLUCOSE, CAPILLARY
Glucose-Capillary: 220 mg/dL — ABNORMAL HIGH (ref 70–99)
Glucose-Capillary: 316 mg/dL — ABNORMAL HIGH (ref 70–99)

## 2012-05-03 LAB — BASIC METABOLIC PANEL
BUN: 26 mg/dL — ABNORMAL HIGH (ref 6–23)
CO2: 28 mEq/L (ref 19–32)
Chloride: 96 mEq/L (ref 96–112)
Creatinine, Ser: 3.06 mg/dL — ABNORMAL HIGH (ref 0.50–1.10)
GFR calc Af Amer: 21 mL/min — ABNORMAL LOW (ref >90)
Potassium: 4.2 mEq/L (ref 3.5–5.1)

## 2012-05-03 MED ORDER — OXYCODONE-ACETAMINOPHEN 5-325 MG PO TABS
ORAL_TABLET | ORAL | Status: AC
  Administered 2012-05-03: 2 via ORAL
  Filled 2012-05-03: qty 2

## 2012-05-03 MED ORDER — ONDANSETRON HCL 4 MG/2ML IJ SOLN
INTRAMUSCULAR | Status: AC
  Administered 2012-05-03: 4 mg via INTRAVENOUS
  Filled 2012-05-03: qty 2

## 2012-05-03 NOTE — Procedures (Signed)
I have seen and examined this patient and agree with the plan of care , patient seen on dialysis.  Bonnee Zertuche W 05/03/2012, 10:31 AM

## 2012-05-03 NOTE — Progress Notes (Signed)
Patient ID: [name], female   DOB: [birth year], 41 y.o.   MRN: [id number]  [facility] PROGRESS NOTE  [name] WUJ:[id number] DOB: Oct 19, 1970 DOA: 04/22/2012 PCP: Ernestine Conrad, MD  Brief narrative:  41 yo F ESRD on HD tue/thu/sat, difficult access, last HD 8/22 , recent aortobifemoral bypass surgery ([facility]) presented 8/27 with gradually increasing swelling of face & BUEs, erythematous papular rash over upper extremities & ears, gen weakness & worse wounds over buttocks. Required emergent HD for hyperkalemia. PCCM admitted.   Principal Problem:  *SVC syndrome  - vascular team following  - pt with very difficult access and for that reason palliative care consult obatained  - HD today in progress  Active Problems:  HYPERTENSION, UNSPECIFIED  - stable during the hospital stay  - continue to monitor vitals per floor protocol   DVT  - on sub Q heparin for prophylaxis   ESRD on dialysis  - managed by nephrology team   DM (diabetes mellitus), type 2 with complications  - continue Lantus as per home medication regimen   Ulcers of both lower extremities  - completed treatment with Vancomycin and no off all antibiotics  - also ? Wound on the back area, sacral area  - unremarkable CT abdomen and pelvis for further evaluation   Hyperkalemia  - will have nephrology team readjust the dialysis medium with K if needed  - renal panel in AM   Acute respiratory failure  - now improved and pt denies SOB   Anemia of chronic disease  - secondary to ESRD - Hg and Hct are stable and at pt's baseline  - CBC in AM   Consultants:  PCCM - signed off  Nephrology  Procedures/Studies:  RIJ permacath 7/22 exchanged 8/2 with SVC angioplasty (IR) exchanged 8/28  08/28 angiogram (OR) --> "functional SVC occlusion" due to HD cath.  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  #2 diffusely narrowed right internal jugular vein, right subclavian vein, and superior vena cava to  approximately 5 mm lumen  #3 23 cm Diatek catheter with tip in right atrium   Antibiotics:  Vancomycin 08/27 - 09/02  Code Status: Full  Family Communication: Pt at bedside  Disposition Plan: SNF on Monday  HPI/Subjective: No events overnight.   Objective: Filed Vitals:   05/03/12 1041 05/03/12 1050 05/03/12 1200 05/03/12 1315  BP: 92/62 121/57 125/73 146/71  Pulse: 104 98 104 95  Temp:  97 F (36.1 C) 98.2 F (36.8 C) 98 F (36.7 C)  TempSrc:  Oral Oral Oral  Resp: 16 12 16 16   Height:      Weight:  85.8 kg (189 lb 2.5 oz)    SpO2:  96% 96% 94%    Intake/Output Summary (Last 24 hours) at 05/03/12 1416 Last data filed at 05/03/12 1300  Gross per 24 hour  Intake    530 ml  Output   2100 ml  Net  -1570 ml    Exam:   General:  Pt is alert, follows commands appropriately, not in acute distress  Cardiovascular: Regular rate and rhythm, S1/S2, no murmurs, no rubs, no gallops  Respiratory: Clear to auscultation bilaterally, no wheezing, no crackles, no rhonchi  Abdomen: Soft, non tender, non distended, bowel sounds present, no guarding  Extremities: No edema, pulses DP and PT palpable bilaterally  Neuro: Grossly nonfocal  Data Reviewed: Basic Metabolic Panel:  Lab 05/03/12 9562 05/02/12 0520 05/01/12 0500 04/29/12 0925  NA 135 134* 131* 132*  K  3.6 4.0 3.7 3.3*  CL 92* 91* 92* 91*  CO2 28 29 25 24   GLUCOSE 187* 287* 187* 129*  BUN 64* 45* 52* 44*  CREATININE 5.90* 4.41* 4.89* 4.89*  CALCIUM 9.6 9.8 9.7 9.1  MG -- -- -- --  PHOS 5.6* 4.7* 5.2* 6.2*   Liver Function Tests:  Lab 05/03/12 0800 05/02/12 0520 05/01/12 0500 04/29/12 0925  AST -- -- -- --  ALT -- -- -- --  ALKPHOS -- -- -- --  BILITOT -- -- -- --  PROT -- -- -- --  ALBUMIN 2.5* 2.7* 2.3* 2.3*   No results found for this basename: LIPASE:5,AMYLASE:5 in the last 168 hours No results found for this basename: AMMONIA:5 in the last 168 hours CBC:  Lab 05/03/12 0800 05/02/12 0520 05/01/12  0500 04/29/12 0925 04/27/12 0525  WBC 9.6 9.2 9.5 9.6 7.8  NEUTROABS -- -- -- -- --  HGB 9.3* 9.8* 9.3* 9.6* 10.1*  HCT 29.8* 31.8* 30.1* 30.0* 33.4*  MCV 99.3 99.4 97.7 96.2 101.2*  PLT 323 314 243 238 215   Cardiac Enzymes: No results found for this basename: CKTOTAL:5,CKMB:5,CKMBINDEX:5,TROPONINI:5 in the last 168 hours BNP: No components found with this basename: POCBNP:5 CBG:  Lab 05/03/12 1129 05/03/12 0434 05/03/12 0027 05/02/12 2126 05/02/12 1717  GLUCAP 220* 193* 227* 181* 228*    No results found for this or any previous visit (from the past 240 hour(s)).   Scheduled Meds:   . ALPRAZolam  1 mg Oral TID  . ARIPiprazole  10 mg Oral Daily  . cinacalcet  30 mg Oral Q supper  . feeding supplement (NEPRO CARB STEADY)  237 mL Oral BID BM  . heparin subcutaneous  5,000 Units Subcutaneous Q8H  . insulin aspart  0-20 Units Subcutaneous Q4H  . insulin glargine  5 Units Subcutaneous QHS  . metoCLOPramide  5 mg Oral TID AC  . NONFORMULARY OR COMPOUNDED ITEM 1 drop  1 drop Ophthalmic Q4H  . ofloxacin  1 drop Both Eyes QID  . PARoxetine  80 mg Oral Daily  . polyvinyl alcohol  1 drop Right Eye Daily   Continuous Infusions:    Debbora Presto, MD  [facility] Pager [phone]  If 7PM-7AM, please contact night-coverage [email or web address] Password [email or web address] 05/03/2012, 2:16 PM   LOS: 11 days

## 2012-05-04 LAB — CBC
HCT: 29.9 % — ABNORMAL LOW (ref 36.0–46.0)
Hemoglobin: 9.1 g/dL — ABNORMAL LOW (ref 12.0–15.0)
MCV: 100 fL (ref 78.0–100.0)
RDW: 17.5 % — ABNORMAL HIGH (ref 11.5–15.5)
WBC: 9.4 10*3/uL (ref 4.0–10.5)

## 2012-05-04 LAB — GLUCOSE, CAPILLARY
Glucose-Capillary: 119 mg/dL — ABNORMAL HIGH (ref 70–99)
Glucose-Capillary: 236 mg/dL — ABNORMAL HIGH (ref 70–99)
Glucose-Capillary: 291 mg/dL — ABNORMAL HIGH (ref 70–99)
Glucose-Capillary: 240 mg/dL — ABNORMAL HIGH (ref 70–99)

## 2012-05-04 MED ORDER — INSULIN ASPART 100 UNIT/ML ~~LOC~~ SOLN
0.0000 [IU] | Freq: Three times a day (TID) | SUBCUTANEOUS | Status: DC
  Administered 2012-05-04: 11 [IU] via SUBCUTANEOUS
  Administered 2012-05-05: 4 [IU] via SUBCUTANEOUS
  Administered 2012-05-05 (×2): 7 [IU] via SUBCUTANEOUS

## 2012-05-04 MED ORDER — INSULIN ASPART 100 UNIT/ML ~~LOC~~ SOLN
0.0000 [IU] | Freq: Every day | SUBCUTANEOUS | Status: DC
  Administered 2012-05-04: 2 [IU] via SUBCUTANEOUS

## 2012-05-04 MED ORDER — INSULIN ASPART 100 UNIT/ML ~~LOC~~ SOLN: 0.0000 [IU] | Freq: Three times a day (TID) | SUBCUTANEOUS | Status: DC

## 2012-05-04 MED ORDER — INSULIN ASPART 100 UNIT/ML ~~LOC~~ SOLN
0.0000 [IU] | Freq: Three times a day (TID) | SUBCUTANEOUS | Status: DC
  Administered 2012-05-04: 7 [IU] via SUBCUTANEOUS

## 2012-05-04 NOTE — Progress Notes (Signed)
[facility] ROUNDING NOTE   Subjective:   Interval History: appears unchanged still weak  Objective:  Vital signs in last 24 hours:  Temp:  [97 F (36.1 C)-98.2 F (36.8 C)] 97.8 F (36.6 C) (09/08 0858) Pulse Rate:  [84-110] 101  (09/08 0858) Resp:  [12-20] 20  (09/08 0858) BP: (92-146)/(57-84) 125/84 mmHg (09/08 0858) SpO2:  [90 %-98 %] 94 % (09/08 0858) Weight:  [85.8 kg (189 lb 2.5 oz)] 85.8 kg (189 lb 2.5 oz) (09/07 1050)  Weight change: 2.3 kg (5 lb 1.1 oz) Filed Weights   05/02/12 2105 05/03/12 0655 05/03/12 1050  Weight: 83.5 kg (184 lb 1.4 oz) 88.2 kg (194 lb 7.1 oz) 85.8 kg (189 lb 2.5 oz)    Intake/Output: I/O last 3 completed shifts: In: 1440 [P.O.:1440] Out: 2100 [Other:2100]   Intake/Output this shift:     FIE:PPIRJJOACZY sleeping in bed- obvious asymmetric facial swelling  SAY:TKZSW RRR, normal S1 and S2  Resp:Coarse BS bilaterally, expiratory wheeze audible  FUX:NATF, obese, NT, BS normal  TDD:UKGUR LE edema, 1-2+ UE/facial edema persists    Basic Metabolic Panel:  Lab 05/03/12 4270 05/03/12 0800 05/02/12 0520 05/01/12 0500 04/29/12 0925  NA 136 135 134* 131* 132*  K 4.2 3.6 4.0 3.7 3.3*  CL 96 92* 91* 92* 91*  CO2 28 28 29 25 24   GLUCOSE 329* 187* 287* 187* 129*  BUN 26* 64* 45* 52* 44*  CREATININE 3.06* 5.90* 4.41* 4.89* 4.89*  CALCIUM 9.3 9.6 9.8 -- --  MG -- -- -- -- --  PHOS -- 5.6* 4.7* 5.2* 6.2*    Liver Function Tests:  Lab 05/03/12 0800 05/02/12 0520 05/01/12 0500 04/29/12 0925  AST -- -- -- --  ALT -- -- -- --  ALKPHOS -- -- -- --  BILITOT -- -- -- --  PROT -- -- -- --  ALBUMIN 2.5* 2.7* 2.3* 2.3*   No results found for this basename: LIPASE:5,AMYLASE:5 in the last 168 hours No results found for this basename: AMMONIA:3 in the last 168 hours  CBC:  Lab 05/04/12 0632 05/03/12 0800 05/02/12 0520 05/01/12 0500 04/29/12 0925  WBC 9.4 9.6 9.2 9.5 9.6  NEUTROABS -- -- -- -- --  HGB 9.1* 9.3* 9.8* 9.3* 9.6*    HCT 29.9* 29.8* 31.8* 30.1* 30.0*  MCV 100.0 99.3 99.4 97.7 96.2  PLT 311 323 314 243 238    Cardiac Enzymes: No results found for this basename: CKTOTAL:5,CKMB:5,CKMBINDEX:5,TROPONINI:5 in the last 168 hours  BNP: No components found with this basename: POCBNP:5  CBG:  Lab 05/04/12 0733 05/03/12 2039 05/03/12 1649 05/03/12 1129 05/03/12 0434  GLUCAP 119* 316* 369* 220* 193*    Microbiology: Results for orders placed during the hospital encounter of 04/22/12  CULTURE, BLOOD (ROUTINE X 2)     Status: Normal   Collection Time   04/22/12  6:10 PM      Component Value Range Status Comment   Specimen Description BLOOD HEMODIALYSIS CATHETER RIGHT   Final    Special Requests BOTTLES DRAWN AEROBIC AND ANAEROBIC 10CC   Final    Culture  Setup Time 04/23/2012 01:29   Final    Culture NO GROWTH 5 DAYS   Final    Report Status 04/29/2012 FINAL   Final   CULTURE, BLOOD (ROUTINE X 2)     Status: Normal   Collection Time   04/22/12  6:15 PM      Component Value Range Status Comment   Specimen Description BLOOD HEMODIALYSIS  CATHETER RIGHT   Final    Special Requests BOTTLES DRAWN AEROBIC AND ANAEROBIC 10CC   Final    Culture  Setup Time 04/23/2012 01:29   Final    Culture NO GROWTH 5 DAYS   Final    Report Status 04/29/2012 FINAL   Final   MRSA PCR SCREENING     Status: Abnormal   Collection Time   04/22/12 10:38 PM      Component Value Range Status Comment   MRSA by PCR POSITIVE (*) NEGATIVE Final     Coagulation Studies: No results found for this basename: LABPROT:5,INR:5 in the last 72 hours  Urinalysis: No results found for this basename: COLORURINE:2,APPERANCEUR:2,LABSPEC:2,PHURINE:2,GLUCOSEU:2,HGBUR:2,BILIRUBINUR:2,KETONESUR:2,PROTEINUR:2,UROBILINOGEN:2,NITRITE:2,LEUKOCYTESUR:2 in the last 72 hours    Imaging: No results found.   Medications:        . ALPRAZolam  1 mg Oral TID  . ARIPiprazole  10 mg Oral Daily  . cinacalcet  30 mg Oral Q supper  . feeding  supplement (NEPRO CARB STEADY)  237 mL Oral BID BM  . heparin subcutaneous  5,000 Units Subcutaneous Q8H  . insulin aspart  0-20 Units Subcutaneous TID WC & HS  . insulin glargine  5 Units Subcutaneous QHS  . metoCLOPramide  5 mg Oral TID AC  . NONFORMULARY OR COMPOUNDED ITEM 1 drop  1 drop Ophthalmic Q4H  . ofloxacin  1 drop Both Eyes QID  . PARoxetine  80 mg Oral Daily  . polyvinyl alcohol  1 drop Right Eye Daily  . DISCONTD: insulin aspart  0-20 Units Subcutaneous Q4H   sodium chloride, sodium chloride, sodium chloride, acetaminophen, acetaminophen, butalbital-acetaminophen-caffeine, calcium carbonate (dosed in mg elemental calcium), camphor-menthol, docusate sodium, feeding supplement (NEPRO CARB STEADY), heparin, heparin, heparin, heparin, hydrOXYzine, lidocaine, lidocaine-prilocaine, menthol-cetylpyridinium, ondansetron (ZOFRAN) IV, ondansetron, oxyCODONE-acetaminophen, pentafluoroprop-tetrafluoroeth, sorbitol zolpidem  Assessment/ Plan:  1. End stage renal disease/volume overload: patient for dialysis TTS 2. Superior vena cava syndrome: Difficult situation in this patient who has had challenging HD access situation- Left sided central circulation seems occluded and not amenable to PTA and Right sided IJ/subclavian seem stenotic at length- Family/patient meeting with palliative care team today to set the goals of care  3. Anemia of chronic kidney disease: Hemoglobin currently at goal, we'll restart ESA when hemoglobin starts trending down.  4. Decubiti/Shallow LE ulcers: On empiric IV vancomycin/analgesics, on MRSA contact precautions  recent aortobifemoral bypass surgery [name]) May     LOS: 12 [name] W @TODAY @10 :07 AM

## 2012-05-04 NOTE — Progress Notes (Signed)
Patient ID: [name], female   DOB: 01-07-1971, 41 y.o.   MRN: [id number]  [facility] PROGRESS NOTE  JENNFER GASSEN NWG:[id number] DOB: 1971-05-18 DOA: 04/22/2012 PCP: Ernestine Conrad, MD  Brief narrative:  41 yo F ESRD on HD tue/thu/sat, difficult access, last HD 8/22 , recent aortobifemoral bypass surgery ([facility]) presented 8/27 with gradually increasing swelling of face & BUEs, erythematous papular rash over upper extremities & ears, gen weakness & worse wounds over buttocks. Required emergent HD for hyperkalemia. PCCM admitted.   Principal Problem:  *SVC syndrome  - vascular team following  - pt with very difficult access and for that reason palliative care consult obatained  - HD TTS  Active Problems:  HYPERTENSION, UNSPECIFIED  - stable during the hospital stay  - continue to monitor vitals per floor protocol   DVT  - on sub Q heparin for prophylaxis   ESRD on dialysis  - managed by nephrology team   DM (diabetes mellitus), type 2 with complications  - continue Lantus as per home medication regimen   Ulcers of both lower extremities  - completed treatment with Vancomycin and no off all antibiotics  - also ? Wound on the back area, sacral area  - unremarkable CT abdomen and pelvis for further evaluation   Hyperkalemia  - will have nephrology team readjust the dialysis medium with K if needed  - renal panel in AM   Acute respiratory failure  - now improved and pt denies SOB   Anemia of chronic disease  - secondary to ESRD - Hg and Hct are stable and at pt's baseline  - CBC in AM   Consultants:  PCCM - signed off  Nephrology Procedures/Studies:  RIJ permacath 7/22 exchanged 8/2 with SVC angioplasty (IR) exchanged 8/28  08/28 angiogram (OR) --> "functional SVC occlusion" due to HD cath.  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  #2 diffusely narrowed right internal jugular vein, right subclavian vein, and superior vena cava to approximately 5  mm lumen  #3 23 cm Diatek catheter with tip in right atrium  Antibiotics:  Vancomycin 08/27 - 09/02  Code Status: Full  Family Communication: Pt at bedside  Disposition Plan: SNF on Monday   HPI/Subjective: No events overnight.   Objective: Filed Vitals:   05/03/12 1652 05/04/12 0459 05/04/12 0858 05/04/12 1335  BP: 133/73 129/66 125/84 172/77  Pulse: 95 84 101 94  Temp: 97.8 F (36.6 C) 98.1 F (36.7 C) 97.8 F (36.6 C) 97.9 F (36.6 C)  TempSrc: Oral Oral Oral Oral  Resp: 18 18 20 20   Height:      Weight:      SpO2: 90% 98% 94% 94%    Intake/Output Summary (Last 24 hours) at 05/04/12 1522 Last data filed at 05/04/12 1300  Gross per 24 hour  Intake   1200 ml  Output      0 ml  Net   1200 ml    Exam:   General:  Pt is alert, follows commands appropriately, not in acute distress  Cardiovascular: Regular rate and rhythm, S1/S2, no murmurs, no rubs, no gallops  Respiratory: Clear to auscultation bilaterally, no wheezing, no crackles, no rhonchi  Abdomen: Soft, non tender, non distended, bowel sounds present, no guarding  Extremities: Trace bilateral pitting edema, pulses DP and PT palpable bilaterally  Neuro: Grossly nonfocal  Data Reviewed: Basic Metabolic Panel:  Lab 05/03/12 5784 05/03/12 0800 05/02/12 0520 05/01/12 0500 04/29/12 0925  NA 136 135 134* 131*  132*  K 4.2 3.6 4.0 3.7 3.3*  CL 96 92* 91* 92* 91*  CO2 28 28 29 25 24   GLUCOSE 329* 187* 287* 187* 129*  BUN 26* 64* 45* 52* 44*  CREATININE 3.06* 5.90* 4.41* 4.89* 4.89*  CALCIUM 9.3 9.6 9.8 9.7 9.1  MG -- -- -- -- --  PHOS -- 5.6* 4.7* 5.2* 6.2*   Liver Function Tests:  Lab 05/03/12 0800 05/02/12 0520 05/01/12 0500 04/29/12 0925  AST -- -- -- --  ALT -- -- -- --  ALKPHOS -- -- -- --  BILITOT -- -- -- --  PROT -- -- -- --  ALBUMIN 2.5* 2.7* 2.3* 2.3*   No results found for this basename: LIPASE:5,AMYLASE:5 in the last 168 hours No results found for this basename: AMMONIA:5 in the  last 168 hours CBC:  Lab 05/04/12 0632 05/03/12 0800 05/02/12 0520 05/01/12 0500 04/29/12 0925  WBC 9.4 9.6 9.2 9.5 9.6  NEUTROABS -- -- -- -- --  HGB 9.1* 9.3* 9.8* 9.3* 9.6*  HCT 29.9* 29.8* 31.8* 30.1* 30.0*  MCV 100.0 99.3 99.4 97.7 96.2  PLT 311 323 314 243 238   Cardiac Enzymes: No results found for this basename: CKTOTAL:5,CKMB:5,CKMBINDEX:5,TROPONINI:5 in the last 168 hours BNP: No components found with this basename: POCBNP:5 CBG:  Lab 05/04/12 1149 05/04/12 0733 05/03/12 2039 05/03/12 1649 05/03/12 1129  GLUCAP 236* 119* 316* 369* 220*    No results found for this or any previous visit (from the past 240 hour(s)).   Scheduled Meds:   . ALPRAZolam  1 mg Oral TID  . ARIPiprazole  10 mg Oral Daily  . cinacalcet  30 mg Oral Q supper  . feeding supplement (NEPRO CARB STEADY)  237 mL Oral BID BM  . heparin subcutaneous  5,000 Units Subcutaneous Q8H  . insulin aspart  0-20 Units Subcutaneous TID WC & HS  . insulin glargine  5 Units Subcutaneous QHS  . metoCLOPramide  5 mg Oral TID AC  . NONFORMULARY OR COMPOUNDED ITEM 1 drop  1 drop Ophthalmic Q4H  . ofloxacin  1 drop Both Eyes QID  . PARoxetine  80 mg Oral Daily  . polyvinyl alcohol  1 drop Right Eye Daily  . DISCONTD: insulin aspart  0-20 Units Subcutaneous Q4H   Continuous Infusions:    Debbora Presto, MD  [facility] Pager [phone]  If 7PM-7AM, please contact night-coverage [email or web address] Password [email or web address] 05/04/2012, 3:22 PM   LOS: 12 days

## 2012-05-05 LAB — BASIC METABOLIC PANEL
CO2: 25 mEq/L (ref 19–32)
Calcium: 9.5 mg/dL (ref 8.4–10.5)
Chloride: 92 mEq/L — ABNORMAL LOW (ref 96–112)
Glucose, Bld: 243 mg/dL — ABNORMAL HIGH (ref 70–99)
Potassium: 4 mEq/L (ref 3.5–5.1)
Sodium: 133 mEq/L — ABNORMAL LOW (ref 135–145)

## 2012-05-05 LAB — CBC
Hemoglobin: 8.9 g/dL — ABNORMAL LOW (ref 12.0–15.0)
MCH: 30.2 pg (ref 26.0–34.0)
Platelets: 319 10*3/uL (ref 150–400)
RBC: 2.95 MIL/uL — ABNORMAL LOW (ref 3.87–5.11)
WBC: 7.8 10*3/uL (ref 4.0–10.5)

## 2012-05-05 MED ORDER — CALCIUM CARBONATE 1250 MG/5ML PO SUSP: 500.0000 mg | Freq: Four times a day (QID) | ORAL | Status: DC | PRN

## 2012-05-05 MED ORDER — ALPRAZOLAM 1 MG PO TABS: 1.0000 mg | ORAL_TABLET | Freq: Three times a day (TID) | ORAL | Status: DC | PRN

## 2012-05-05 MED ORDER — METOCLOPRAMIDE HCL 5 MG PO TABS: 5.0000 mg | ORAL_TABLET | Freq: Three times a day (TID) | ORAL | Status: AC

## 2012-05-05 MED ORDER — OXYCODONE-ACETAMINOPHEN 10-325 MG PO TABS: 0.5000 | ORAL_TABLET | ORAL | Status: DC | PRN

## 2012-05-05 MED ORDER — DARBEPOETIN ALFA-POLYSORBATE 60 MCG/0.3ML IJ SOLN: 60.0000 ug | INTRAMUSCULAR | Status: DC

## 2012-05-05 MED ORDER — INSULIN GLARGINE 100 UNIT/ML ~~LOC~~ SOLN: 5.0000 [IU] | Freq: Every day | SUBCUTANEOUS | Status: DC

## 2012-05-05 MED ORDER — BUTALBITAL-APAP-CAFFEINE 50-325-40 MG PO TABS: 1.0000 | ORAL_TABLET | Freq: Three times a day (TID) | ORAL | Status: DC

## 2012-05-05 MED ORDER — HEPARIN SODIUM (PORCINE) 1000 UNIT/ML DIALYSIS
100.0000 [IU]/kg | INTRAMUSCULAR | Status: DC | PRN
  Filled 2012-05-05: qty 9

## 2012-05-05 MED ORDER — EPOETIN ALFA 10000 UNIT/ML IJ SOLN: 10000.0000 [IU] | INTRAMUSCULAR | Status: DC

## 2012-05-05 NOTE — Progress Notes (Signed)
Patient ID: [name], female   DOB: February 06, 1971, 41 y.o.   MRN: [id number]  [facility] Progress Note    Subjective:   No complaints   Objective:   BP 165/75  Pulse 82  Temp 97.3 F (36.3 C) (Oral)  Resp 18  Ht 5\' 2"  (1.575 m)  Wt 85.9 kg (189 lb 6 oz)  BMI 34.64 kg/m2  SpO2 100%  LMP 09/03/2008  Physical Exam: Gen:WD WN obese WF in NAD CVS:no rub Resp:decreased BS at bases FAO:ZHYQMV Ext:3+edema  Labs: BMET  Lab 05/05/12 0658 05/03/12 1544 05/03/12 0800 05/02/12 0520 05/01/12 0500 04/29/12 0925  NA 133* 136 135 134* 131* 132*  K 4.0 4.2 3.6 4.0 3.7 3.3*  CL 92* 96 92* 91* 92* 91*  CO2 25 28 28 29 25 24   GLUCOSE 243* 329* 187* 287* 187* 129*  BUN 51* 26* 64* 45* 52* 44*  CREATININE 4.93* 3.06* 5.90* 4.41* 4.89* 4.89*  ALBUMIN -- -- 2.5* 2.7* 2.3* 2.3*  CALCIUM 9.5 9.3 9.6 9.8 9.7 9.1  PHOS -- -- 5.6* 4.7* 5.2* 6.2*   CBC  Lab 05/05/12 0658 05/04/12 0632 05/03/12 0800 05/02/12 0520  WBC 7.8 9.4 9.6 9.2  NEUTROABS -- -- -- --  HGB 8.9* 9.1* 9.3* 9.8*  HCT 29.2* 29.9* 29.8* 31.8*  MCV 99.0 100.0 99.3 99.4  PLT 319 311 323 314    @IMGRELPRIORS @ Medications:      . ALPRAZolam  1 mg Oral TID  . ARIPiprazole  10 mg Oral Daily  . cinacalcet  30 mg Oral Q supper  . feeding supplement (NEPRO CARB STEADY)  237 mL Oral BID BM  . heparin subcutaneous  5,000 Units Subcutaneous Q8H  . insulin aspart  0-20 Units Subcutaneous TID WC  . insulin aspart  0-5 Units Subcutaneous QHS  . insulin glargine  5 Units Subcutaneous QHS  . metoCLOPramide  5 mg Oral TID AC  . NONFORMULARY OR COMPOUNDED ITEM 1 drop  1 drop Ophthalmic Q4H  . ofloxacin  1 drop Both Eyes QID  . PARoxetine  80 mg Oral Daily  . polyvinyl alcohol  1 drop Right Eye Daily  . DISCONTD: insulin aspart  0-20 Units Subcutaneous TID WC & HS  . DISCONTD: insulin aspart  0-20 Units Subcutaneous TID WC  . DISCONTD: insulin aspart  0-20 Units Subcutaneous TID WC     Assessment/ Plan:     1. Volume overload- pt noncompliant with fluid and sodium restriction.  Stressed importance of reducing fluid and salt intake 2. ESRDcont TTS 3. Anemia:on ESA 4. Groin wound post aortobifem- per [facility] for wound care 5. Nutrition:- dietician education to entire family may be beneficial 6. Hypertension: volume overloaded.  UF with HD tomorrow 7. Vascular access- running out of options.  Needs to f/u with her primary nephrologist to discuss options and EOL issues. 8. DM- per primary svc 9. Noncompliance- ongoing 10. Disposition- awaiting SNF vs home discharge  Mackie Holness A 05/05/2012, 2:27 PM

## 2012-05-05 NOTE — Progress Notes (Signed)
   CARE MANAGEMENT NOTE 05/05/2012  Patient:  RUCHY, WILDRICK   Account Number:  [id number]  Date Initiated:  04/24/2012  Documentation initiated by:  AMERSON,JULIE  Subjective/Objective Assessment:   PT ADM WITH ESRD AND SUPERIOR VENA CAVA SYNDROME.  PTA, PT LIVES WITH FAMILY.  SHE IS ACTIVE WITH [facility] FOR HH AIDE, AND [facility] FOR SKILLED SERVICES.     Action/Plan:   PT REMAINS INTUBATED CURRENTLY, S/P ANGIOGRAPHY.  WILL CONT TO MONITOR PROGRESS AND FOLLOW FOR DISCHARGE PLANNING. WILL MAKE ALL EFFORTS TO CONTACT FAMILY, AS NONE CURRENTLY AT BEDSIDE.   Anticipated DC Date:  05/05/2012   Anticipated DC Plan:  HOME W HOME HEALTH SERVICES      DC Planning Services  CM consult      [facility] Choice  HOME HEALTH   Choice offered to / List presented to:  C-1 Patient        HH arranged  HH-1 RN  HH-2 PT      HH agency  [facility]   Status of service:  Completed, signed off Medicare Important Message given?   (If response is "NO", the following Medicare IM given date fields will be blank) Date Medicare IM given:   Date Additional Medicare IM given:    Discharge Disposition:  HOME W HOME HEALTH SERVICES  Per UR Regulation:  Reviewed for med. necessity/level of care/duration of stay  If discussed at Long Length of Stay Meetings, dates discussed:    Comments:

## 2012-05-05 NOTE — Progress Notes (Signed)
Orthopedic Tech Progress Note Patient Details:  [name] May 08, 1971 [id number]  Ortho Devices Type of Ortho Device:  (prafo boot) Ortho Device/Splint Location: right foot Ortho Device/Splint Interventions: Freeman Caldron, Chin Wachter 05/05/2012, 5:15 PM

## 2012-05-05 NOTE — Progress Notes (Signed)
Orthopedic Tech Progress Note Patient Details:  [name] Dec 29, 1970 [id number]  Patient ID: [name], female   DOB: 12/03/1970, 41 y.o.   MRN: [id number] prafo boot was delivered to patient's room prior to discharge  Nikki Dom 05/05/2012, 5:15 PM

## 2012-05-05 NOTE — Discharge Summary (Addendum)
Physician Discharge Summary  [name] JXB:[id number] DOB: 1971-07-06 DOA: 04/22/2012  PCP: Ernestine Conrad, MD  Admit date: 04/22/2012 Discharge date: 05/05/2012  Recommendations for Outpatient Follow-up:  1. Pt will need to follow up with PCP in 2-3 weeks post discharge 2. Please obtain BMP to evaluate electrolytes and specifically potassium   3. Please also check CBC to evaluate Hg and Hct levels 4. Home health PT and RN set up  Discharge Diagnoses: Encephalopathy secondary to hyperkalemia and requiring emergent HD Principal Problem:  *SVC syndrome Active Problems:  HYPERTENSION, UNSPECIFIED  DVT  ESRD on dialysis  DM (diabetes mellitus), type 2 with complications  Ulcers of both lower extremities  Hyperkalemia  Pleural effusion  Acute respiratory failure   Discharge Condition: Stable  Diet recommendation: Heart healthy diet discussed in details   Brief narrative:  41 yo F ESRD on HD tue/thu/sat, difficult access, last HD 8/22 , recent aortobifemoral bypass surgery ([facility]) presented 8/27 with gradually increasing swelling of face & BUEs, erythematous papular rash over upper extremities & ears, gen weakness & worse wounds over buttocks. Required emergent HD for hyperkalemia. PCCM admitted.   Principal Problem:  *SVC syndrome  - vascular team followed during this hospital stay - pt with very difficult access and for that reason palliative care consult obatained  - HD TTS   Active Problems:  HYPERTENSION, UNSPECIFIED  - stable during the hospital stay  - continued to monitor vitals per floor protocol   DVT  - on sub Q heparin for prophylaxis   ESRD on dialysis  - managed by nephrology team   DM (diabetes mellitus), type 2 with complications  - continued Lantus as per home medication regimen   Ulcers of both lower extremities  - completed treatment with Vancomycin and no off all antibiotics  - also ? Wound on the back area, sacral area  - unremarkable CT  abdomen and pelvis, no signs of abscess, local wound care recommended  Hyperkalemia  - will have nephrology team readjust the dialysis medium with K if needed  - potassium has remained stable on discharge day  Acute respiratory failure  - now improved and pt denies SOB   Anemia of chronic disease  - secondary to ESRD - Hg and Hct are stable and at pt's baseline  - CBC will need to be checked upon follow up with PCP  Consultants:  PCCM  Nephrology  Procedures/Studies:  Maudry Mayhew 7/22 exchanged 8/2 with SVC angioplasty (IR) exchanged 8/28  08/28 angiogram (OR) --> "functional SVC occlusion" due to HD cath.  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  #2 diffusely narrowed right internal jugular vein, right subclavian vein, and superior vena cava to approximately 5 mm lumen  #3 23 cm Diatek catheter with tip in right atrium   Antibiotics:  Vancomycin 08/27 - 09/02  Discharge Exam: Filed Vitals:   05/05/12 1321  BP: 165/75  Pulse: 82  Temp: 97.3 F (36.3 C)  Resp: 18   Filed Vitals:   05/04/12 2101 05/05/12 0422 05/05/12 0900 05/05/12 1321  BP: 144/55 149/73 153/83 165/75  Pulse: 78 100 96 82  Temp: 98.2 F (36.8 C) 97.4 F (36.3 C) 97.8 F (36.6 C) 97.3 F (36.3 C)  TempSrc: Oral Oral Oral Oral  Resp: 18 18 18 18   Height:      Weight: 85.9 kg (189 lb 6 oz)     SpO2: 95% 97% 98% 100%    General: Pt is alert, follows commands appropriately,  not in acute distress, chronically ill appearing Cardiovascular: Regular rate and rhythm, S1/S2 +, no murmurs, no rubs, no gallops Respiratory: Clear to auscultation bilaterally with mild bibasilar crackles Abdominal: Soft, non tender, non distended, bowel sounds +, no guarding Extremities: + 2 bilateral pitting edema, no cyanosis, pulses palpable bilaterally DP and PT Neuro: Grossly nonfocal  Discharge Instructions  Discharge Orders    Future Appointments: Provider: Department: Dept Phone: Center:   05/19/2012  1:30 PM [facility] [phone] [facility]   05/19/2012 2:00 PM [facility] [phone] [facility]   05/19/2012 3:15 PM Nada Libman, MD [facility] [phone] [facility]     Future Orders Please Complete By Expires   Diet - low sodium heart healthy      Increase activity slowly          Medication List     As of 05/08/2012  8:56 PM    TAKE these medications         albuterol 108 (90 BASE) MCG/ACT inhaler   Commonly known as: PROVENTIL HFA;VENTOLIN HFA   Inhale 2 puffs into the lungs every 6 (six) hours as needed. Shortness of breath      ALPRAZolam 1 MG tablet   Commonly known as: XANAX   Take 1 tablet (1 mg total) by mouth 3 (three) times daily as needed. For anxiety      ARIPiprazole 2 MG tablet   Commonly known as: ABILIFY   Take 10 mg by mouth daily.      b complex-vitamin c-folic acid 0.8 MG Tabs   Take 0.8 mg by mouth at bedtime.      BACITRACIN OP   Place 1 drop into both eyes 6 (six) times daily.      BC HEADACHE POWDER PO   Take 0.5 packets by mouth daily after breakfast. headache      butalbital-acetaminophen-caffeine 50-325-40 MG per tablet   Commonly known as: FIORICET, ESGIC   Take 1 tablet by mouth 3 (three) times daily after meals. For headaches      calcium carbonate (dosed in mg elemental calcium) 1250 MG/5ML   Take 5 mLs (500 mg of elemental calcium total) by mouth every 6 (six) hours as needed.      cinacalcet 30 MG tablet   Commonly known as: SENSIPAR   Take 30 mg by mouth daily with supper.      clopidogrel 75 MG tablet   Commonly known as: PLAVIX   Take 75 mg by mouth daily. Last dose 12/17/2011      collagenase ointment   Commonly known as: SANTYL   Apply 1 application topically daily.      darbepoetin 60 MCG/0.3ML Soln   Commonly known as: ARANESP   Inject 60 mcg into the vein every Saturday with hemodialysis.      gentamicin 0.3 % ophthalmic solution   Commonly known as: GARAMYCIN   Place 1 drop into both eyes  4 (four) times daily.      insulin glargine 100 UNIT/ML injection   Commonly known as: LANTUS   Inject 5 Units into the skin at bedtime.      insulin lispro 100 UNIT/ML injection   Commonly known as: HUMALOG   Inject 7-15 Units into the skin 4 (four) times daily -  before meals and at bedtime. TAKING ON SLIDING SCALE  200-249=7 units  250-299=10 units  300-349=12 units  >350=15 units      metoCLOPramide 5 MG tablet   Commonly known as: REGLAN  Take 1 tablet (5 mg total) by mouth 3 (three) times daily before meals.      omeprazole 40 MG capsule   Commonly known as: PRILOSEC   Take 40 mg by mouth daily.      oxyCODONE-acetaminophen 10-325 MG per tablet   Commonly known as: PERCOCET   Take 0.5 tablets by mouth every 4 (four) hours as needed. For pain      PARoxetine 40 MG tablet   Commonly known as: PAXIL   Take 80 mg by mouth every morning.      promethazine 25 MG tablet   Commonly known as: PHENERGAN   Take 1 tablet (25 mg total) by mouth 2 (two) times daily as needed for nausea.      pyridoxine 100 MG tablet   Commonly known as: B-6   Take 100 mg by mouth daily.      sevelamer 800 MG tablet   Commonly known as: RENVELA   Take 1,600-4,000 mg by mouth 5 (five) times daily. Take 5 tablets daily with each meal and take 2 tablets with snacks      SYSTANE LID WIPES Pads   Place 1 application into the right eye daily.      VANCOMYCIN HCL PO   Place 1 drop into both eyes 6 (six) times daily. Vancomycin 50mg /ml opthalmic solution [facility]           Follow-up Information    Follow up with Ernestine Conrad, MD.   Contact information:   [facility] [address] [place] [address] [phone]       Follow up with Ernestine Conrad, MD in 1 week.   Contact information:   [facility] [address] [place] [address] [phone]           The results of significant diagnostics from  this hospitalization (including imaging, microbiology, ancillary and laboratory) are listed below for reference.     Microbiology: No results found for this or any previous visit (from the past 240 hour(s)).   Labs: Basic Metabolic Panel:  Lab 05/05/12 1324 05/03/12 1544 05/03/12 0800 05/02/12 0520  NA 133* 136 135 134*  K 4.0 4.2 3.6 4.0  CL 92* 96 92* 91*  CO2 25 28 28 29   GLUCOSE 243* 329* 187* 287*  BUN 51* 26* 64* 45*  CREATININE 4.93* 3.06* 5.90* 4.41*  CALCIUM 9.5 9.3 9.6 9.8  MG -- -- -- --  PHOS -- -- 5.6* 4.7*   Liver Function Tests:  Lab 05/03/12 0800 05/02/12 0520  AST -- --  ALT -- --  ALKPHOS -- --  BILITOT -- --  PROT -- --  ALBUMIN 2.5* 2.7*   No results found for this basename: LIPASE:5,AMYLASE:5 in the last 168 hours No results found for this basename: AMMONIA:5 in the last 168 hours CBC:  Lab 05/05/12 0658 05/04/12 0632 05/03/12 0800 05/02/12 0520  WBC 7.8 9.4 9.6 9.2  NEUTROABS -- -- -- --  HGB 8.9* 9.1* 9.3* 9.8*  HCT 29.2* 29.9* 29.8* 31.8*  MCV 99.0 100.0 99.3 99.4  PLT 319 311 323 314   Cardiac Enzymes: No results found for this basename: CKTOTAL:5,CKMB:5,CKMBINDEX:5,TROPONINI:5 in the last 168 hours BNP: BNP (last 3 results)  Basename 02/14/12 1447 08/20/11 1010  PROBNP 13271.0* 4201.0*   CBG:  Lab 05/05/12 1620 05/05/12 1145 05/05/12 0740 05/04/12 2105 05/04/12 1642  GLUCAP 232* 177* 243* 240* 291*     SIGNED: Time coordinating  discharge: Over 30 minutes  Debbora Presto, MD  [facility] 05/08/2012, 8:56 PM Pager [phone]  If 7PM-7AM, please contact night-coverage [email or web address] Password [email or web address]

## 2012-05-05 NOTE — Clinical Social Work Placement (Signed)
     Clinical Social Work Department CLINICAL SOCIAL WORK PLACEMENT NOTE 05/05/2012  Patient:  [name], [name]  Account Number:  [id number] Admit date:  04/22/2012  Clinical Social Worker:  Genelle Bal, LCSW  Date/time:  05/05/2012 07:34 AM  Clinical Social Work is seeking post-discharge placement for this patient at the following level of care:   SKILLED NURSING   (*CSW will update this form in Epic as items are completed)   05/02/2012  Patient/family provided with [facility] Department of Clinical Social Works list of facilities offering this level of care within the geographic area requested by the patient (or if unable, by the patients family).  05/02/2012  Patient/family informed of their freedom to choose among providers that offer the needed level of care, that participate in Medicare, Medicaid or managed care program needed by the patient, have an available bed and are willing to accept the patient.    Patient/family informed of [facility] ownership interest in [facility], as well as of the fact that they are under no obligation to receive care at this facility.  PASARR submitted to EDS on 05/02/2012 PASARR number received from EDS on   FL2 transmitted to all facilities in geographic area requested by pt/family on  05/02/2012 FL2 transmitted to all facilities within larger geographic area on   Patient informed that his/her managed care company has contracts with or will negotiate with  certain facilities, including the following:     Patient/family informed of bed offers received:   Patient chooses bed at  Physician recommends and patient chooses bed at    Patient to be transferred to  on   Patient to be transferred to facility by   The following physician request were entered in Epic:   Additional Comments: 05/02/12 - Level II PASARR submiltted. 05/05/12 - Patient discharged home transported by family.

## 2012-05-05 NOTE — Progress Notes (Signed)
Pt discharged to home with daughter and mother. Pt re-educated on importance of limiting fluid and sodium intake. Pt. Is alert and oriented. Pt is hemodynamically stable. AVS reviewed with pt. Capable of re verbalizing medication regimen. Discharge plan appropriate and in place. Allena Earing

## 2012-05-05 NOTE — Progress Notes (Signed)
Inpatient Diabetes Program Recommendations  AACE/ADA: New Consensus Statement on Inpatient Glycemic Control (2013)  Target Ranges:  Prepandial:   less than 140 mg/dL      Peak postprandial:   less than 180 mg/dL (1-2 hours)      Critically ill patients:  140 - 180 mg/dL   Reason for Visit: Hyperglycemia  Inpatient Diabetes Program Recommendations Insulin - Basal: Increase Lantus to 10 units   Note: Thank you, Lenor Coffin, RN, CNS, Diabetes Coordinator [phone])

## 2012-05-05 NOTE — Progress Notes (Signed)
Physical Therapy Treatment Patient Details Name: [name] MRN: [id number] DOB: 08/21/1971 Today's Date: 05/05/2012 Time: 4098-1191 PT Time Calculation (min): 31 min  PT Assessment / Plan / Recommendation Comments on Treatment Session  Discussed obtaining a resting foot splint and/or going to an orthotist outpatient to consider an AFO; Pt to dc home with family today; REcommending HHPT/OT/Aide; Case Mger notified    Follow Up Recommendations  Home health PT;Supervision/Assistance - 24 hour    Barriers to Discharge        Equipment Recommendations  None recommended by PT    Recommendations for Other Services    Frequency Min 3X/week   Plan Discharge plan needs to be updated    Precautions / Restrictions Precautions Precautions: Fall   Pertinent Vitals/Pain no apparent distress     Mobility  Transfers Transfers: Sit to Stand;Stand to Sit Sit to Stand: 4: Min assist;With upper extremity assist;From bed Stand to Sit: 4: Min assist;With armrests;To chair/3-in-1 Details for Transfer Assistance: cues for safety and technique; Dependent on forward trunk lean to get center of mass over feet and dependent on UEs pushing on RW; performed 4 reps for therex; fatigued quickly Ambulation/Gait Ambulation/Gait Assistance: 4: Min guard Ambulation Distance (Feet): 25 Feet Assistive device: Rolling walker Ambulation/Gait Assistance Details: Cues for posture and RW management Gait Pattern: Shuffle;Decreased dorsiflexion - right;Trunk flexed    Exercises General Exercises - Lower Extremity Ankle Circles/Pumps: AROM;20 reps;Both;Seated (passive R dorsiflexion stretch; unabl eto get past neutral) Quad Sets: AROM;Both;10 reps;Seated Short Arc Quad: AROM;Both;10 reps (alternating)   PT Diagnosis:    PT Problem List:   PT Treatment Interventions:     PT Goals Acute Rehab PT Goals Time For Goal Achievement: 05/14/12 Potential to Achieve Goals: Good Pt will go Sit to Stand: with  modified independence PT Goal: Sit to Stand - Progress: Progressing toward goal Pt will go Stand to Sit: with modified independence PT Goal: Stand to Sit - Progress: Progressing toward goal Pt will Ambulate: 51 - 150 feet;with modified independence;with rolling walker PT Goal: Ambulate - Progress: Progressing toward goal  Visit Information  Last PT Received On: 05/05/12 Assistance Needed: +1    Subjective Data  Subjective: Excited at the thought of going home Patient Stated Goal: to be more independent and less dependent on mother and sister for her care   Cognition  Overall Cognitive Status: Appears within functional limits for tasks assessed/performed Arousal/Alertness: Awake/alert Orientation Level: Appears intact for tasks assessed Behavior During Session: [name] for tasks performed    Balance     End of Session PT - End of Session Equipment Utilized During Treatment: Gait belt Activity Tolerance: Patient tolerated treatment well Patient left: in chair;with call bell/phone within reach Nurse Communication: Mobility status   GP     [name], [name] [phone]    05/05/2012, 4:52 PM

## 2012-05-05 NOTE — Clinical Social Work Note (Signed)
Patient and family decided that she would be able to discharge home with [facility] services and continuing care of her mother and sister. Family provided patient transport home.  Genelle Bal, MSW, LCSW [phone]

## 2012-05-12 ENCOUNTER — Other Ambulatory Visit: Payer: Self-pay | Admitting: *Deleted

## 2012-05-12 DIAGNOSIS — I739 Peripheral vascular disease, unspecified: Secondary | ICD-10-CM

## 2012-05-12 DIAGNOSIS — Z48812 Encounter for surgical aftercare following surgery on the circulatory system: Secondary | ICD-10-CM

## 2012-05-13 ENCOUNTER — Other Ambulatory Visit (INDEPENDENT_AMBULATORY_CARE_PROVIDER_SITE_OTHER): Payer: Self-pay | Admitting: Internal Medicine

## 2012-05-14 ENCOUNTER — Other Ambulatory Visit (INDEPENDENT_AMBULATORY_CARE_PROVIDER_SITE_OTHER): Payer: Self-pay | Admitting: Internal Medicine

## 2012-05-16 ENCOUNTER — Encounter: Payer: Self-pay | Admitting: Surgery

## 2012-05-19 ENCOUNTER — Encounter: Payer: Self-pay | Admitting: Surgery

## 2012-05-19 ENCOUNTER — Ambulatory Visit (INDEPENDENT_AMBULATORY_CARE_PROVIDER_SITE_OTHER): Payer: Medicare Other | Admitting: Surgery

## 2012-05-19 ENCOUNTER — Encounter (INDEPENDENT_AMBULATORY_CARE_PROVIDER_SITE_OTHER): Payer: Medicare Other | Admitting: Vascular Surgery

## 2012-05-19 ENCOUNTER — Encounter: Payer: Medicare Other | Admitting: Vascular Surgery

## 2012-05-19 VITALS — BP 179/92 | HR 120 | Temp 100.3°F | Ht 62.0 in | Wt 189.0 lb

## 2012-05-19 DIAGNOSIS — Z48812 Encounter for surgical aftercare following surgery on the circulatory system: Secondary | ICD-10-CM

## 2012-05-19 DIAGNOSIS — I70219 Atherosclerosis of native arteries of extremities with intermittent claudication, unspecified extremity: Secondary | ICD-10-CM

## 2012-05-19 DIAGNOSIS — I739 Peripheral vascular disease, unspecified: Secondary | ICD-10-CM

## 2012-05-19 NOTE — Progress Notes (Signed)
[facility] [place]   Patient name: [name] MRN: [id number] DOB: May 06, 1971 Sex: female     Chief Complaint  Patient presents with  . PVD    ABI's done, pt refused  arterial duplex    HISTORY OF PRESENT ILLNESS: The patient is back today for followup. She is status post aortobifemoral bypass graft and right femoral to above-knee popliteal artery bypass graft with Gore-Tex in May of 2013 for 2 ulcers on her right leg one just below the knee and one above the ankle. Her postoperative course was complicated by groin wound issues. These have been treated with local wound care and have nearly healed. She also had problems with leg swelling which was multifactorial. She continues to be on dialysis. She is complaining of 3 days worth of headaches today. Otherwise she is getting better.  Past Medical History  Diagnosis Date  . DVT (deep venous thrombosis)     DVT HISTORY  . Diabetes mellitus   . Migraine     HISTORY  . Anxiety and depression   . CAD (coronary artery disease)     Last cath 2009:  LAD stent 80% stenosis. Circumflex 60-70% stenosis AV groove, right coronary artery 50-60% stenosis. She did have cutting balloon angioplasty of the LAD lesion into a diagonal. However, there was restenosis of this and it was managed medically.  . Bipolar 1 disorder   . Hyperlipidemia   . Hyperparathyroidism   . PE (pulmonary embolism)     HISTORY  . Dialysis patient   . Anemia   . Renal failure     dialysis [place] t,th sat  . Nephrotic syndrome   . Peripheral vascular disease   . MRSA infection   . Anxiety   . Depression   . Gastroparesis   . Complication of anesthesia   . PONV (postoperative nausea and vomiting)     x3 days post anesth. on 12/19/2011  . Myocardial infarction     x3 , last one - 10 yrs. ago  . Hypertension     sees Dr. Antoine Poche - [place], last stress test 11/2011, see result in EPIC, saw Dr. Antoine Poche as well at that time    . Shortness of  breath   . Recurrent upper respiratory infection (URI)     Bronchitis 11/2011 - saw Dr. Loney Hering, treating currently /w antibiotic   . GERD (gastroesophageal reflux disease)   . Pneumonia     APH- 2012  . Noncompliance of patient with renal dialysis     Past Surgical History  Procedure Date  . Incise and drain abcess     OF THIGHS FROM INSULIN INJECTIONS  . Heart stents   . Portacath placement 2003  . Cataract extraction w/phaco 11/09/2011    Procedure: CATARACT EXTRACTION PHACO AND INTRAOCULAR LENS PLACEMENT (IOC);  Surgeon: Edmon Crape, MD;  Location: [facility] OR;  Service: Ophthalmology;  Laterality: Right;  . Pars plana vitrectomy 11/09/2011    Procedure: PARS PLANA VITRECTOMY WITH 23 GAUGE;  Surgeon: Edmon Crape, MD;  Location: [facility] OR;  Service: Ophthalmology;  Laterality: Right;  Ahmed Valve; Scleral Reinforcement Graft  . Dg av dialysis  shunt access exist*l* or 12/13/11    left arm  . Dialysis cath inserted & portacath      dialysis catheter inserted & portacath d/c'd- 12/19/2011  . Back surgery X 4    x4 times   . Cholecystectomy   . Eye surgery   . Cardiac catheterization   .  Aorta - bilateral femoral artery bypass graft 12/28/2011    Procedure: AORTA BIFEMORAL BYPASS GRAFT;  Surgeon: Nada Libman, MD;  Location: [facility] OR;  Service: Vascular;  Laterality: N/A;  AortoBifemoral Bypass using hemasheild 14x7 graft  . Femoral-popliteal bypass graft 12/28/2011    Procedure: BYPASS GRAFT FEMORAL-POPLITEAL ARTERY;  Surgeon: Nada Libman, MD;  Location: [facility] OR;  Service: Vascular;  Laterality: Right;  right femoral popliteal bypass using 6x80 propaten graft  . Insertion of dialysis catheter 02/15/2012    Procedure: INSERTION OF DIALYSIS CATHETER;  Surgeon: Chuck Hint, MD;  Location: [facility] OR;  Service: Vascular;  Laterality: Right;  insertion of dialysis catheter right internal jugular vein,removal of left internal jugular vein dialysis catheter  . Insertion of dialysis catheter  02/21/2012    Procedure: INSERTION OF DIALYSIS CATHETER;  Surgeon: Chuck Hint, MD;  Location: [facility] OR;  Service: Cardiovascular;  Laterality: N/A;  Insertion new dialysis catheter; possible venoplasty of fibrin sheath of  Right IJ catheter  . Venogram 02/21/2012    Procedure: VENOGRAM;  Surgeon: Chuck Hint, MD;  Location: [facility] OR;  Service: Cardiovascular;  Laterality: N/A;  possible venoplasty  . Insertion of dialysis catheter 04/23/2012    Procedure: INSERTION OF DIALYSIS CATHETER;  Surgeon: Sherren Kerns, MD;  Location: [facility] OR;  Service: Vascular;  Laterality: Right;  using a 45fr. x 23 cannon catheter in Right Internal Jugular    History   Social History  . Marital Status: Divorced    Spouse Name: N/A    Number of Children: N/A  . Years of Education: N/A   Occupational History  . Not on file.   Social History Main Topics  . Smoking status: Current Every Day Smoker -- 1.0 packs/day for 20 years    Types: Cigarettes  . Smokeless tobacco: Never Used   Comment: pt states she is not ready to quit   . Alcohol Use: No  . Drug Use: No  . Sexually Active: No   Other Topics Concern  . Not on file   Social History Narrative  . No narrative on file    Family History  Problem Relation Age of Onset  . Transient ischemic attack Mother   . Hepatitis Sister   . Diabetes type II Other   . Anesthesia problems Neg Hx   . Hypotension Neg Hx   . Malignant hyperthermia Neg Hx   . Pseudochol deficiency Neg Hx     Allergies as of 05/19/2012 - Review Complete 05/19/2012  Allergen Reaction Noted  . Compazine (prochlorperazine) Swelling 06/25/2011  . Contrast media (iodinated diagnostic agents) Swelling 06/25/2011  . Meperidine hcl Swelling   . Metformin and related Other (See Comments) 08/20/2011  . Morphine Swelling   . Nubain (nalbuphine hcl) Swelling 04/15/2011  . Tobramycin  03/06/2012  . Topiramate (topamax) Swelling 06/25/2011  . Toradol (ketorolac  tromethamine) Swelling 04/15/2011    Current Outpatient Prescriptions on File Prior to Visit  Medication Sig Dispense Refill  . albuterol (PROVENTIL HFA;VENTOLIN HFA) 108 (90 BASE) MCG/ACT inhaler Inhale 2 puffs into the lungs every 6 (six) hours as needed. Shortness of breath      . ALPRAZolam (XANAX) 1 MG tablet Take 1 tablet (1 mg total) by mouth 3 (three) times daily as needed. For anxiety  60 tablet  0  . ARIPiprazole (ABILIFY) 2 MG tablet Take 10 mg by mouth daily.       . Aspirin-Salicylamide-Caffeine (BC HEADACHE POWDER PO) Take 0.5 packets by mouth  daily after breakfast. headache      . b complex-vitamin c-folic acid (NEPHRO-VITE) 0.8 MG TABS Take 0.8 mg by mouth at bedtime.      Marland Kitchen BACITRACIN OP Place 1 drop into both eyes 6 (six) times daily.      . butalbital-acetaminophen-caffeine (FIORICET, ESGIC) 50-325-40 MG per tablet Take 1 tablet by mouth 3 (three) times daily after meals. For headaches  45 tablet  0  . calcium carbonate, dosed in mg elemental calcium, 1250 MG/5ML Take 5 mLs (500 mg of elemental calcium total) by mouth every 6 (six) hours as needed.  450 mL  1  . cinacalcet (SENSIPAR) 30 MG tablet Take 30 mg by mouth daily with supper.       . clopidogrel (PLAVIX) 75 MG tablet Take 75 mg by mouth daily. Last dose 12/17/2011      . collagenase (SANTYL) ointment Apply 1 application topically daily.      . darbepoetin (ARANESP) 60 MCG/0.3ML SOLN Inject 60 mcg into the vein every Saturday with hemodialysis.      . Eyelid Cleansers (SYSTANE LID WIPES) PADS Place 1 application into the right eye daily.      Marland Kitchen gentamicin (GARAMYCIN) 0.3 % ophthalmic solution Place 1 drop into both eyes 4 (four) times daily.      . insulin glargine (LANTUS) 100 UNIT/ML injection Inject 5 Units into the skin at bedtime.  10 mL  1  . insulin lispro (HUMALOG) 100 UNIT/ML injection Inject 7-15 Units into the skin 4 (four) times daily -  before meals and at bedtime. TAKING ON SLIDING SCALE 200-249=7  units 250-299=10 units 300-349=12 units >350=15 units      . omeprazole (PRILOSEC) 40 MG capsule Take 40 mg by mouth daily.       Marland Kitchen oxyCODONE-acetaminophen (PERCOCET) 10-325 MG per tablet Take 0.5 tablets by mouth every 4 (four) hours as needed. For pain  65 tablet  0  . PARoxetine (PAXIL) 40 MG tablet Take 80 mg by mouth every morning.       . promethazine (PHENERGAN) 25 MG tablet TAKE ONE TABLET BY MOUTH EVERY 6 HOURS AS NEEDED FOR NAUSEA  120 tablet  0  . pyridoxine (B-6) 100 MG tablet Take 100 mg by mouth daily.      . sevelamer (RENVELA) 800 MG tablet Take 1,600-4,000 mg by mouth 5 (five) times daily. *Take 5 tablets daily with each meal and take 2 tablets with snacks**      . VANCOMYCIN HCL PO Place 1 drop into both eyes 6 (six) times daily. Vancomycin 50mg /ml opthalmic [facility]         REVIEW OF SYSTEMS: Positive for headaches negative the wrist negative chills No shortness of breath No chest pain Positive lower extremity edema  PHYSICAL EXAMINATION:   Vital signs are BP 179/92  Pulse 120  Temp 100.3 F (37.9 C) (Oral)  Ht 5\' 2"  (1.575 m)  Wt 189 lb (85.73 kg)  BMI 34.57 kg/m2  SpO2 100%  LMP 09/03/2008 General: The patient appears their stated age. HEENT:  No gross abnormalities Pulmonary:  Non labored breathing Abdomen: Soft and non-tender Musculoskeletal: There are no major deformities. Neurologic: No focal weakness or paresthesias are detected, Skin: The ulcer below the knee has completely healed. The ulcer at the level of the ankle as only a small residual eschar remaining, less than 1 cm Psychiatric: The patient has normal affect. Cardiovascular:Pedal pulses cannot be palpated due to 2 edema The right  groin wound has completely healed. There is a small area of superficial ulceration with beefy red granulation tissue at its base on the left. This measures approximately 2 cm x 1 cm.  Diagnostic Studies The patient did not  tolerate a duplex today 2 she could not lie flat. Her ABI on the right was 1.0 on the left was 0.47  Assessment: Status post aortobifem and right femoral-popliteal bypass graft right leg ulcer Plan: The patient has excellent blood flow to her right leg. Her ulcers have nearly completely resolved. In addition, her groin wounds have nearly completely healed. Despite having a relatively complicated postoperative course I'm pleased with the progress we have made recently. She is almost completely healed all of her wounds. I will have her come back to see me in 3 months. At that time we will repeat her duplex which hopefully she will tolerate  V. Charlena Cross, M.D. [facility] [place] Office: [phone] Pager:  [phone]

## 2012-05-20 NOTE — Addendum Note (Signed)
Addended by: Sharee Pimple on: 05/20/2012 11:17 AM   Modules accepted: Orders

## 2012-05-22 ENCOUNTER — Ambulatory Visit (INDEPENDENT_AMBULATORY_CARE_PROVIDER_SITE_OTHER): Payer: Medicare Other | Admitting: Internal Medicine

## 2012-05-24 ENCOUNTER — Emergency Department (HOSPITAL_COMMUNITY)
Admission: EM | Admit: 2012-05-24 | Discharge: 2012-05-24 | Disposition: A | Discharge status: Home / Self Care | Payer: Medicare Other | Attending: Emergency Medicine | Admitting: Emergency Medicine

## 2012-05-24 ENCOUNTER — Emergency Department (HOSPITAL_COMMUNITY): Payer: Medicare Other

## 2012-05-24 ENCOUNTER — Other Ambulatory Visit: Payer: Self-pay

## 2012-05-24 ENCOUNTER — Encounter (HOSPITAL_COMMUNITY): Payer: Self-pay | Admitting: Adult Health

## 2012-05-24 DIAGNOSIS — N186 End stage renal disease: Secondary | ICD-10-CM

## 2012-05-24 DIAGNOSIS — Z992 Dependence on renal dialysis: Secondary | ICD-10-CM | POA: Insufficient documentation

## 2012-05-24 DIAGNOSIS — Z794 Long term (current) use of insulin: Secondary | ICD-10-CM | POA: Insufficient documentation

## 2012-05-24 DIAGNOSIS — Z86711 Personal history of pulmonary embolism: Secondary | ICD-10-CM | POA: Insufficient documentation

## 2012-05-24 DIAGNOSIS — E785 Hyperlipidemia, unspecified: Secondary | ICD-10-CM | POA: Insufficient documentation

## 2012-05-24 DIAGNOSIS — F17200 Nicotine dependence, unspecified, uncomplicated: Secondary | ICD-10-CM | POA: Insufficient documentation

## 2012-05-24 DIAGNOSIS — Z86718 Personal history of other venous thrombosis and embolism: Secondary | ICD-10-CM | POA: Insufficient documentation

## 2012-05-24 DIAGNOSIS — Z79899 Other long term (current) drug therapy: Secondary | ICD-10-CM | POA: Insufficient documentation

## 2012-05-24 DIAGNOSIS — I2510 Atherosclerotic heart disease of native coronary artery without angina pectoris: Secondary | ICD-10-CM | POA: Insufficient documentation

## 2012-05-24 DIAGNOSIS — K219 Gastro-esophageal reflux disease without esophagitis: Secondary | ICD-10-CM | POA: Insufficient documentation

## 2012-05-24 DIAGNOSIS — D649 Anemia, unspecified: Secondary | ICD-10-CM | POA: Insufficient documentation

## 2012-05-24 DIAGNOSIS — Z8614 Personal history of Methicillin resistant Staphylococcus aureus infection: Secondary | ICD-10-CM | POA: Insufficient documentation

## 2012-05-24 DIAGNOSIS — M7989 Other specified soft tissue disorders: Secondary | ICD-10-CM | POA: Insufficient documentation

## 2012-05-24 DIAGNOSIS — R609 Edema, unspecified: Secondary | ICD-10-CM

## 2012-05-24 DIAGNOSIS — F319 Bipolar disorder, unspecified: Secondary | ICD-10-CM | POA: Insufficient documentation

## 2012-05-24 DIAGNOSIS — I252 Old myocardial infarction: Secondary | ICD-10-CM | POA: Insufficient documentation

## 2012-05-24 DIAGNOSIS — E119 Type 2 diabetes mellitus without complications: Secondary | ICD-10-CM | POA: Insufficient documentation

## 2012-05-24 DIAGNOSIS — Z9119 Patient's noncompliance with other medical treatment and regimen: Secondary | ICD-10-CM

## 2012-05-24 LAB — COMPREHENSIVE METABOLIC PANEL
AST: 11 U/L (ref 0–37)
Albumin: 2.3 g/dL — ABNORMAL LOW (ref 3.5–5.2)
Alkaline Phosphatase: 201 U/L — ABNORMAL HIGH (ref 39–117)
CO2: 19 mEq/L (ref 19–32)
Chloride: 88 mEq/L — ABNORMAL LOW (ref 96–112)
Creatinine, Ser: 4.37 mg/dL — ABNORMAL HIGH (ref 0.50–1.10)
GFR calc non Af Amer: 12 mL/min — ABNORMAL LOW (ref >90)
Potassium: 4.9 mEq/L (ref 3.5–5.1)
Total Bilirubin: 0.1 mg/dL — ABNORMAL LOW (ref 0.3–1.2)

## 2012-05-24 LAB — CBC
HCT: 30.4 % — ABNORMAL LOW (ref 36.0–46.0)
MCV: 97.4 fL (ref 78.0–100.0)
Platelets: 361 10*3/uL (ref 150–400)
RBC: 3.12 MIL/uL — ABNORMAL LOW (ref 3.87–5.11)
RDW: 21.6 % — ABNORMAL HIGH (ref 11.5–15.5)
WBC: 17.3 10*3/uL — ABNORMAL HIGH (ref 4.0–10.5)

## 2012-05-24 LAB — GLUCOSE, CAPILLARY: Glucose-Capillary: 91 mg/dL (ref 70–99)

## 2012-05-24 NOTE — ED Notes (Signed)
Dialysis pt, last dialysis Thursday, edema noted to bilateral upper and lower extremities and face. Pt is lethargic, continues to fall asleep as being triaged, difficult to get answers from. Dialysis port in upper right chest. Facial swelling noted. Diminished bilateral lung sounds, +2 pitting edema bilateral lower extremities

## 2012-05-24 NOTE — ED Notes (Signed)
Patient resting with eyes closed appear to be sleeping 

## 2012-05-24 NOTE — ED Provider Notes (Signed)
History     CSN: [id number]  Arrival date & time 05/24/12  1551   First MD Initiated Contact with Patient 05/24/12 1642      Chief Complaint  Patient presents with  . 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 She reports she did not go to her dialysis session this morning due to increasing swelling of her legs which has caused her pain. 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 Pt was apparently very sleepy in triage but awake and answering questions for me. She takes percocet and xanax at home. Pt sleeps sitting up in a recliner but no change. She has had some dry cough, but no fevers.   Past Medical History  Diagnosis Date  . DVT (deep venous thrombosis)     DVT HISTORY  . Diabetes mellitus   . Migraine     HISTORY  . Anxiety and depression   . CAD (coronary artery disease)     Last cath 2009:  LAD stent 80% stenosis. Circumflex 60-70% stenosis AV groove, right coronary artery 50-60% stenosis. She did have cutting balloon angioplasty of the LAD lesion into a diagonal. However, there was restenosis of this and it was managed medically.  . Bipolar 1 disorder   . Hyperlipidemia   . Hyperparathyroidism   . PE (pulmonary embolism)     HISTORY  . Dialysis patient   . Anemia   . Renal failure     dialysis [place] t,th sat  . Nephrotic syndrome   . Peripheral vascular disease   . MRSA infection   . Anxiety   . Depression   . Gastroparesis   . Complication of anesthesia   . PONV (postoperative nausea and vomiting)     x3 days post anesth. on 12/19/2011  . Myocardial infarction     x3 , last one - 10 yrs. ago  . Hypertension     sees Dr. Antoine Poche - [place], last stress test  11/2011, see result in EPIC, saw Dr. Antoine Poche as well at that time    . Shortness of breath   . Recurrent upper respiratory infection (URI)     Bronchitis 11/2011 - saw Dr. Loney Hering, treating currently /w antibiotic   . GERD (gastroesophageal reflux disease)   . Pneumonia     APH- 2012  . Noncompliance of patient with renal dialysis     Past Surgical History  Procedure Date  . Incise and drain abcess     OF THIGHS FROM INSULIN INJECTIONS  . Heart stents   . Portacath placement 2003  . Cataract extraction w/phaco 11/09/2011    Procedure: CATARACT EXTRACTION PHACO AND INTRAOCULAR LENS PLACEMENT (IOC);  Surgeon: Edmon Crape, MD;  Location: [facility] OR;  Service: Ophthalmology;  Laterality: Right;  . Pars plana vitrectomy 11/09/2011    Procedure: PARS PLANA VITRECTOMY WITH 23 GAUGE;  Surgeon: Edmon Crape, MD;  Location: [facility] OR;  Service: Ophthalmology;  Laterality: Right;  Ahmed Valve; Scleral Reinforcement Graft  . Dg av dialysis  shunt access exist*l* or 12/13/11    left arm  . Dialysis cath inserted & portacath      dialysis catheter inserted & portacath d/c'd- 12/19/2011  . Back surgery X 4    x4 times   .  Cholecystectomy   . Eye surgery   . Cardiac catheterization   . Aorta - bilateral femoral artery bypass graft 12/28/2011    Procedure: AORTA BIFEMORAL BYPASS GRAFT;  Surgeon: Nada Libman, MD;  Location: [facility] OR;  Service: Vascular;  Laterality: N/A;  AortoBifemoral Bypass using hemasheild 14x7 graft  . Femoral-popliteal bypass graft 12/28/2011    Procedure: BYPASS GRAFT FEMORAL-POPLITEAL ARTERY;  Surgeon: Nada Libman, MD;  Location: [facility] OR;  Service: Vascular;  Laterality: Right;  right femoral popliteal bypass using 6x80 propaten graft  . Insertion of dialysis catheter 02/15/2012    Procedure: INSERTION OF DIALYSIS CATHETER;  Surgeon: Chuck Hint, MD;  Location: [facility] OR;  Service: Vascular;  Laterality: Right;  insertion of dialysis catheter right internal jugular vein,removal of  left internal jugular vein dialysis catheter  . Insertion of dialysis catheter 02/21/2012    Procedure: INSERTION OF DIALYSIS CATHETER;  Surgeon: Chuck Hint, MD;  Location: [facility] OR;  Service: Cardiovascular;  Laterality: N/A;  Insertion new dialysis catheter; possible venoplasty of fibrin sheath of  Right IJ catheter  . Venogram 02/21/2012    Procedure: VENOGRAM;  Surgeon: Chuck Hint, MD;  Location: [facility] OR;  Service: Cardiovascular;  Laterality: N/A;  possible venoplasty  . Insertion of dialysis catheter 04/23/2012    Procedure: INSERTION OF DIALYSIS CATHETER;  Surgeon: Sherren Kerns, MD;  Location: [facility] OR;  Service: Vascular;  Laterality: Right;  using a 11fr. x 23 cannon catheter in Right Internal Jugular    Family History  Problem Relation Age of Onset  . Transient ischemic attack Mother   . Hepatitis Sister   . Diabetes type II Other   . Anesthesia problems Neg Hx   . Hypotension Neg Hx   . Malignant hyperthermia Neg Hx   . Pseudochol deficiency Neg Hx     History  Substance Use Topics  . Smoking status: Current Every Day Smoker -- 1.0 packs/day for 20 years    Types: Cigarettes  . Smokeless tobacco: Never Used   Comment: pt states she is not ready to quit   . Alcohol Use: No    OB History    Grav Para Term Preterm Abortions TAB SAB Ect Mult Living                  Review of Systems All other systems reviewed and are negative except as noted in HPI.   Allergies  Compazine; Contrast media; Meperidine hcl; Metformin and related; Morphine; Nubain; Tobramycin; Topiramate; and Toradol  Home Medications   Current Outpatient Rx  Name Route Sig Dispense Refill  . ALBUTEROL SULFATE HFA 108 (90 BASE) MCG/ACT IN AERS Inhalation Inhale 2 puffs into the lungs every 6 (six) hours as needed. Shortness of breath    . ALPRAZOLAM 1 MG PO TABS Oral Take 1 tablet (1 mg total) by mouth 3 (three) times daily as needed. For anxiety 60 tablet 0  . ARIPIPRAZOLE 2 MG PO  TABS Oral Take 10 mg by mouth daily.     . BC HEADACHE POWDER PO Oral Take 0.5 packets by mouth daily after breakfast. headache    . NEPHRO-VITE 0.8 MG PO TABS Oral Take 0.8 mg by mouth at bedtime.    Marland Kitchen BACITRACIN OP Both Eyes Place 1 drop into both eyes 6 (six) times daily.    Marland Kitchen BUTALBITAL-APAP-CAFFEINE 50-325-40 MG PO TABS Oral Take 1 tablet by mouth 3 (three) times daily after meals. For headaches 45 tablet 0  . CALCIUM  CARBONATE 1250 MG/5ML PO SUSP Oral Take 5 mLs (500 mg of elemental calcium total) by mouth every 6 (six) hours as needed. 450 mL 1  . CINACALCET HCL 30 MG PO TABS Oral Take 30 mg by mouth daily with supper.     Marland Kitchen CLOPIDOGREL BISULFATE 75 MG PO TABS Oral Take 75 mg by mouth daily. Last dose 12/17/2011    . COLLAGENASE 250 UNIT/GM EX OINT Topical Apply 1 application topically daily.    Marland Kitchen DARBEPOETIN ALFA-POLYSORBATE 60 MCG/0.3ML IJ SOLN Intravenous Inject 60 mcg into the vein every Saturday with hemodialysis.    Frazier Butt LID WIPES EX PADS Right Eye Place 1 application into the right eye daily.    . GENTAMICIN SULFATE 0.3 % OP SOLN Both Eyes Place 1 drop into both eyes 4 (four) times daily.    Marland Kitchen HOMATROPAIRE 5 % OP SOLN Ophthalmic Apply 1 drop to eye.    . INSULIN GLARGINE 100 UNIT/ML [place] SOLN Subcutaneous Inject 5 Units into the skin at bedtime. 10 mL 1  . INSULIN LISPRO (HUMAN) 100 UNIT/ML [place] SOLN Subcutaneous Inject 7-15 Units into the skin 4 (four) times daily -  before meals and at bedtime. TAKING ON SLIDING SCALE 200-249=7 units 250-299=10 units 300-349=12 units >350=15 units    . OMEPRAZOLE 40 MG PO CPDR Oral Take 40 mg by mouth daily.     . OXYCODONE-ACETAMINOPHEN 10-325 MG PO TABS Oral Take 0.5 tablets by mouth every 4 (four) hours as needed. For pain 65 tablet 0  . PAROXETINE HCL 40 MG PO TABS Oral Take 80 mg by mouth every morning.     Marland Kitchen PREDNISOLONE ACETATE 1 % OP SUSP Both Eyes Place 1 drop into both eyes.    Marland Kitchen PROMETHAZINE HCL 25 MG PO TABS  TAKE ONE TABLET BY  MOUTH EVERY 6 HOURS AS NEEDED FOR NAUSEA 120 tablet 0  . PYRIDOXINE HCL 100 MG PO TABS Oral Take 100 mg by mouth daily.    Marland Kitchen SEVELAMER CARBONATE 800 MG PO TABS Oral Take 1,600-4,000 mg by mouth 5 (five) times daily. *Take 5 tablets daily with each meal and take 2 tablets with snacks**    . VANCOMYCIN HCL PO Both Eyes Place 1 drop into both eyes 6 (six) times daily. Vancomycin 50mg /ml opthalmic solution [facility]    . VIGAMOX 0.5 % OP SOLN Ophthalmic Apply 1 drop to eye.      BP 140/68  Pulse 91  Temp 97.9 F (36.6 C) (Oral)  Resp 10  SpO2 98%  LMP 09/03/2008  Physical Exam  Nursing note and vitals reviewed. Constitutional: She is oriented to person, place, and time. She appears well-developed and well-nourished.  HENT:  Head: Normocephalic and atraumatic.  Eyes: EOM are normal. Pupils are equal, round, and reactive to light.  Neck: Normal range of motion. Neck supple.  Cardiovascular: Normal rate, normal heart sounds and intact distal pulses.   Pulmonary/Chest: Effort normal and breath sounds normal.  Abdominal: Bowel sounds are normal. She exhibits no distension. There is no tenderness.  Musculoskeletal: Normal range of motion. She exhibits edema (marked edema of LE bilaterally, less severe to UE and face) and tenderness.  Neurological: She is alert and oriented to person, place, and time. She has normal strength. No cranial nerve deficit or sensory deficit.  Skin: Skin is warm and dry. No rash noted.  Psychiatric: She has a normal mood and affect.    ED Course  Procedures (including critical care time)  Labs Reviewed  CBC - Abnormal; Notable for the following:    WBC 17.3 (*)     RBC 3.12 (*)     Hemoglobin 9.9 (*)     HCT 30.4 (*)     RDW 21.6 (*)     All other components within normal limits  COMPREHENSIVE METABOLIC PANEL - Abnormal; Notable for the following:    Sodium 129 (*)     Chloride 88 (*)     BUN 58 (*)     Creatinine, Ser 4.37 (*)      Albumin 2.3 (*)     Alkaline Phosphatase 201 (*)     Total Bilirubin 0.1 (*)     GFR calc non Af Amer 12 (*)     GFR calc Af Amer 13 (*)     All other components within normal limits  GLUCOSE, CAPILLARY  GLUCOSE, CAPILLARY   Dg Chest 1 View  05/24/2012  *RADIOLOGY REPORT*  Clinical Data: Hypertension, smoker  CHEST - 1 VIEW  Comparison: Chest radiograph 08/29 1013  Findings: Normal cardiac silhouette.  There is a large bore right central venous line is unchanged.  Interval extubation.  There are low lung volumes similar to prior.  Bibasilar atelectasis is unchanged.  No overt pulmonary edema.  IMPRESSION:  1.  Low lung volumes 2.  Bibasilar atelectasis.   Original Report Authenticated By: Genevive Bi, M.D.      No diagnosis found.    MDM   Date: 05/24/2012  Rate: 99  Rhythm: normal sinus rhythm  QRS Axis: normal  Intervals: normal  ST/T Wave abnormalities: normal  Conduction Disutrbances:none  Narrative Interpretation:   Old EKG Reviewed: unchanged  7:07 PM Pt sleeping comfortably. No respiratory distress. Her labs and imaging do not indicate the need for emergent or inpatient dialysis. Family at bedside states she has been non-compliant with dialysis since discharge and rarely goes for the full session and frequently misses sessions. She does not have an indication today for admission however the family was cautioned to have her re-checked in the next 24-48hrs and to return to the ED immediately for any other concerns.       Nargis Abrams B. Bernette Mayers, MD 05/24/12 Izell Marcus

## 2012-05-24 NOTE — ED Notes (Signed)
Back from xray

## 2012-05-24 NOTE — ED Notes (Signed)
Instructions given to patient and her family. Using the teach back method. Family very upset. They feel that the patient needs to be an admission. I advised Dr. Bernette Mayers that the family was very upset about the discharge. He had already spoke with the family in great detail about the follow up care and to go for dialysis on Tuesday. Family also advised that her blood work did not warrant emergency dialysis, Patients sister advised that she was going to file a complaint. Patient and her family where advised if patients condition got worse prior to her follow up to return to [facility] ED.

## 2012-05-24 NOTE — ED Notes (Signed)
CBG 91 

## 2012-05-24 NOTE — ED Notes (Signed)
Patient is very sleepy, although she arouses easily.  Her SATs drop into the the 80's she was placed on 4 liter of oxygen via nasal cannula. O2 Sat 100% at this time.

## 2012-05-24 NOTE — ED Notes (Signed)
Patient transported to X-ray 

## 2012-05-24 NOTE — ED Notes (Signed)
Patient advises that she was suppose to have dialysis this morning and she did not. She did not feel well so she did not go. She is alert and oriented and follows commands. Her bilateral lower extremities are very edematous. Patient has multiple skin lesions.She fell asleep during the assessment.

## 2012-05-26 ENCOUNTER — Encounter (INDEPENDENT_AMBULATORY_CARE_PROVIDER_SITE_OTHER): Payer: Self-pay | Admitting: Internal Medicine

## 2012-05-26 ENCOUNTER — Ambulatory Visit (INDEPENDENT_AMBULATORY_CARE_PROVIDER_SITE_OTHER): Payer: Medicare Other | Admitting: Internal Medicine

## 2012-05-26 VITALS — BP 120/70 | HR 76 | Temp 97.6°F | Ht 70.5 in | Wt 253.0 lb

## 2012-05-26 DIAGNOSIS — K3184 Gastroparesis: Secondary | ICD-10-CM

## 2012-05-26 DIAGNOSIS — R110 Nausea: Secondary | ICD-10-CM

## 2012-05-26 DIAGNOSIS — K219 Gastro-esophageal reflux disease without esophagitis: Secondary | ICD-10-CM

## 2012-05-26 MED ORDER — ESOMEPRAZOLE MAGNESIUM 40 MG PO CPDR: 40.0000 mg | DELAYED_RELEASE_CAPSULE | Freq: Every day | ORAL | Status: DC

## 2012-05-26 NOTE — Progress Notes (Signed)
Subjective:     Patient ID: [name], female   DOB: 1971/03/28, 41 y.o.   MRN: [id number]  HPIHere for f/u of her chronic nausea and gastroparesis. She tells me she skipped dialysis Saturday. Weight is up to 253 from 181 her last visit.  She is swollen all over.  Her appetite is not good.  Caregiver says [name] is craving vinegar. She drank 1/2 the jar.  She has been taking the [place] Powder's x 2 a day. I advised her today not to take anymore Goody Powders.   BM every day. No rectal bleeding or melena. Patient has multiple chronic multiple problems. Acid reflux which is not controlled at this time. Hx of gastroparesis. She sometimes only eats one meal a day. She says sometimes she is up and down all night.  She was recently seen at [facility] labor day week end for acute respiratory failure. She received dialysis.  She evidently was on the vent.  Last seen in June of this year for chronic nausea and gastroparesis. Review of Systems see hpi Current Outpatient Prescriptions  Medication Sig Dispense Refill  . albuterol (PROVENTIL HFA;VENTOLIN HFA) 108 (90 BASE) MCG/ACT inhaler Inhale 2 puffs into the lungs every 6 (six) hours as needed. Shortness of breath      . ALPRAZolam (XANAX) 1 MG tablet Take 1 tablet (1 mg total) by mouth 3 (three) times daily as needed. For anxiety  60 tablet  0  . ARIPiprazole (ABILIFY) 2 MG tablet Take 10 mg by mouth daily.       . Aspirin-Salicylamide-Caffeine (BC HEADACHE POWDER PO) Take 0.5 packets by mouth daily after breakfast. headache      . b complex-vitamin c-folic acid (NEPHRO-VITE) 0.8 MG TABS Take 0.8 mg by mouth at bedtime.      Marland Kitchen BACITRACIN OP Place 1 drop into both eyes 6 (six) times daily.      . butalbital-acetaminophen-caffeine (FIORICET, ESGIC) 50-325-40 MG per tablet Take 1 tablet by mouth 3 (three) times daily after meals. For headaches  45 tablet  0  . cinacalcet (SENSIPAR) 30 MG tablet Take 30 mg by mouth daily with supper.       .  clopidogrel (PLAVIX) 75 MG tablet Take 75 mg by mouth daily. Last dose 12/17/2011      . collagenase (SANTYL) ointment Apply 1 application topically daily.      . darbepoetin (ARANESP) 60 MCG/0.3ML SOLN Inject 60 mcg into the vein every Saturday with hemodialysis.      . Eyelid Cleansers (SYSTANE LID WIPES) PADS Place 1 application into the right eye daily.      Marland Kitchen gentamicin (GARAMYCIN) 0.3 % ophthalmic solution Place 1 drop into both eyes 4 (four) times daily.      . insulin glargine (LANTUS) 100 UNIT/ML injection Inject 33 Units into the skin at bedtime.      . insulin lispro (HUMALOG) 100 UNIT/ML injection Inject 7-15 Units into the skin 4 (four) times daily -  before meals and at bedtime. TAKING ON SLIDING SCALE 200-249=7 units 250-299=10 units 300-349=12 units >350=15 units      . metoCLOPramide (REGLAN) 10 MG tablet Take 10 mg by mouth 4 (four) times daily.      Marland Kitchen oxyCODONE-acetaminophen (PERCOCET) 10-325 MG per tablet Take 0.5 tablets by mouth every 4 (four) hours as needed. For pain  65 tablet  0  . PARoxetine (PAXIL) 40 MG tablet Take 80 mg by mouth every morning.       Marland Kitchen  pyridoxine (B-6) 100 MG tablet Take 100 mg by mouth daily.      . sevelamer (RENVELA) 800 MG tablet Take 1,600-4,000 mg by mouth 5 (five) times daily. *Take 5 tablets daily with each meal and take 2 tablets with snacks**      . VANCOMYCIN HCL PO Place 1 drop into both eyes 6 (six) times daily. Vancomycin 50mg /ml [facility]thalmic [facility]      . HOMATROPAIRE 5 % ophthalmic solution Apply 1 drop to eye.      Marland Kitchen omeprazole (PRILOSEC) 40 MG capsule Take 40 mg by mouth daily.       . prednisoLONE acetate (PRED FORTE) 1 % ophthalmic suspension Place 1 drop into both eyes.      . promethazine (PHENERGAN) 25 MG tablet TAKE ONE TABLET BY MOUTH EVERY 6 HOURS AS NEEDED FOR NAUSEA  120 tablet  0  . VIGAMOX 0.5 % ophthalmic solution Apply 1 drop to eye.       Past Medical History  Diagnosis Date  .  DVT (deep venous thrombosis)     DVT HISTORY  . Diabetes mellitus   . Migraine     HISTORY  . Anxiety and depression   . CAD (coronary artery disease)     Last cath 2009:  LAD stent 80% stenosis. Circumflex 60-70% stenosis AV groove, right coronary artery 50-60% stenosis. She did have cutting balloon angioplasty of the LAD lesion into a diagonal. However, there was restenosis of this and it was managed medically.  . Bipolar 1 disorder   . Hyperlipidemia   . Hyperparathyroidism   . PE (pulmonary embolism)     HISTORY  . Dialysis patient   . Anemia   . Renal failure     dialysis [place] t,th sat  . Nephrotic syndrome   . Peripheral vascular disease   . MRSA infection   . Anxiety   . Depression   . Gastroparesis   . Complication of anesthesia   . PONV (postoperative nausea and vomiting)     x3 days post anesth. on 12/19/2011  . Myocardial infarction     x3 , last one - 10 yrs. ago  . Hypertension     sees Dr. Antoine [name] - [place], last stress test 11/2011, see result in EPIC, saw Dr. Antoine [name] as well at that time    . Shortness of breath   . Recurrent upper respiratory infection (URI)     Bronchitis 11/2011 - saw Dr. Loney [name], treating currently /w antibiotic   . GERD (gastroesophageal reflux disease)   . Pneumonia     APH- 2012  . Noncompliance of patient with renal dialysis    Past Surgical History  Procedure Date  . Incise and drain abcess     OF THIGHS FROM INSULIN INJECTIONS  . Heart stents   . Portacath placement 2003  . Cataract extraction w/phaco 11/09/2011    Procedure: CATARACT EXTRACTION PHACO AND INTRAOCULAR LENS PLACEMENT (IOC);  Surgeon: [name] Crape, MD;  Location: [facility] OR;  Service: Ophthalmology;  Laterality: Right;  . Pars plana vitrectomy 11/09/2011    Procedure: PARS PLANA VITRECTOMY WITH 23 GAUGE;  Surgeon: [name] Crape, MD;  Location: [facility] OR;  Service: Ophthalmology;  Laterality: Right;  Ahmed Valve; Scleral Reinforcement Graft  . Dg av dialysis  shunt access  exist*l* or 12/13/11    left arm  . Dialysis cath inserted & portacath      dialysis catheter inserted & portacath d/c'd- 12/19/2011  .  Back surgery X 4    x4 times   . Cholecystectomy   . Eye surgery   . Cardiac catheterization   . Aorta - bilateral femoral artery bypass graft 12/28/2011    Procedure: AORTA BIFEMORAL BYPASS GRAFT;  Surgeon: Nada Libman, MD;  Location: [facility] OR;  Service: Vascular;  Laterality: N/A;  AortoBifemoral Bypass using hemasheild 14x7 graft  . Femoral-popliteal bypass graft 12/28/2011    Procedure: BYPASS GRAFT FEMORAL-POPLITEAL ARTERY;  Surgeon: Nada Libman, MD;  Location: [facility] OR;  Service: Vascular;  Laterality: Right;  right femoral popliteal bypass using 6x80 propaten graft  . Insertion of dialysis catheter 02/15/2012    Procedure: INSERTION OF DIALYSIS CATHETER;  Surgeon: Chuck Hint, MD;  Location: [facility] OR;  Service: Vascular;  Laterality: Right;  insertion of dialysis catheter right internal jugular vein,removal of left internal jugular vein dialysis catheter  . Insertion of dialysis catheter 02/21/2012    Procedure: INSERTION OF DIALYSIS CATHETER;  Surgeon: Chuck Hint, MD;  Location: [facility] OR;  Service: Cardiovascular;  Laterality: N/A;  Insertion new dialysis catheter; possible venoplasty of fibrin sheath of  Right IJ catheter  . Venogram 02/21/2012    Procedure: VENOGRAM;  Surgeon: Chuck Hint, MD;  Location: [facility] OR;  Service: Cardiovascular;  Laterality: N/A;  possible venoplasty  . Insertion of dialysis catheter 04/23/2012    Procedure: INSERTION OF DIALYSIS CATHETER;  Surgeon: Sherren Kerns, MD;  Location: [facility] OR;  Service: Vascular;  Laterality: Right;  using a 53fr. x 23 cannon catheter in Right Internal Jugular   Family Status  Relation Status Death Age  . Mother Alive     mini strokes  . Sister Alive     Hepatitis C  . Other Deceased   . Father Alive   . Brother Alive   . Sister Alive     Back Problems   Allergies    Allergen Reactions  . Compazine (Prochlorperazine) Swelling    Per echart records  . Contrast Media (Iodinated Diagnostic Agents) Swelling    Per echart records  . Meperidine Hcl Swelling  . Metformin And Related Other (See Comments)    'just not myself, was told not to take metformin'  . Morphine Swelling  . Nubain (Nalbuphine Hcl) Swelling  . Tobramycin   . Topiramate (Topamax) Swelling    Tongue swelling - per echart records  . Toradol (Ketorolac Tromethamine) Swelling        Objective:   Physical Exam Filed Vitals:   05/26/12 1451  BP: 120/70  Pulse: 76  Temp: 97.6 F (36.4 C)  Height: 5' 10.5" (1.791 m)  Weight: 253 lb (114.76 kg)   Alert and oriented. Skin warm and dry. Oral mucosa is moist.   . Sclera anicteric, conjunctivae is pink. Thyroid not enlarged. No cervical lymphadenopathy. Lungs clear. Heart regular rate and rhythm.  Abdomen is soft. Bowel sounds are positive. . No abdominal masses felt. No tenderness.  No edema to lower extremities. Patient is alert and oriented.  Patient examined from wheelchair. No side effect from the Reglan.    Assessment:     Chronic nausea with hx of gastroparesis.    Plan:    Small meals. Rx for Nexium eprescribed to pharmacy, OV in 3 months with Dr. Karilyn Cota. Stop the [place] Powder's

## 2012-05-26 NOTE — Patient Instructions (Addendum)
Keep apt with dialysis tomorrow. Nexium e prescribed to her pharmacy. No more Goody Powders

## 2012-05-27 DIAGNOSIS — Z22322 Carrier or suspected carrier of Methicillin resistant Staphylococcus aureus: Secondary | ICD-10-CM

## 2012-05-27 DIAGNOSIS — L0390 Cellulitis, unspecified: Secondary | ICD-10-CM

## 2012-05-27 HISTORY — DX: Cellulitis, unspecified: L03.90

## 2012-05-27 HISTORY — DX: Carrier or suspected carrier of methicillin resistant Staphylococcus aureus: Z22.322

## 2012-06-02 ENCOUNTER — Telehealth: Payer: Self-pay

## 2012-06-02 DIAGNOSIS — IMO0001 Reserved for inherently not codable concepts without codable children: Secondary | ICD-10-CM

## 2012-06-02 DIAGNOSIS — M7989 Other specified soft tissue disorders: Secondary | ICD-10-CM

## 2012-06-02 NOTE — Telephone Encounter (Signed)
Message copied by Phillips Odor on Mon Jun 02, 2012  1:31 PM ------      Message from: Shari Prows      Created: Mon Jun 02, 2012 11:23 AM      Regarding: triage phone call      Contact: [phone]       Please call Lupita Leash in regards to the above patient. She is experiencing l leg pain, swelling and soreness. No recent injury to her leg. She was last seen on 05/19/12 by [facility] with vascular lab studies as well. Thanks, Drinda Butts

## 2012-06-02 NOTE — Telephone Encounter (Signed)
Spoke with sister, Lupita Leash.  Reports pt. Has redness and swelling of left lower leg from knee to toes for approx. 2 weeks.  States left leg is sore to touch.  States pt. C/o "burning" in left lower leg. Unsure if pt. Has fever, but states "has had chills".  Discussed w/ Dr. Myra Gianotti.  Rec'd verbal order for left lower extremity venous duplex.  States can schedule today or tomorrow.  Spoke w/ sister.  Sister stated pt. cannot get to office before late afternoon today, and will schedule for tomorrow.  Advised will be notified of time of appt. per scheduling staff.  Per Dr. Myra Gianotti, if pt. comes in tomorrow to see Nurse practitioner, have him call me after assessing pt.

## 2012-06-03 ENCOUNTER — Encounter (INDEPENDENT_AMBULATORY_CARE_PROVIDER_SITE_OTHER): Payer: Medicare Other | Admitting: *Deleted

## 2012-06-03 ENCOUNTER — Ambulatory Visit (INDEPENDENT_AMBULATORY_CARE_PROVIDER_SITE_OTHER): Payer: Medicare Other | Admitting: Neurosurgery

## 2012-06-03 ENCOUNTER — Encounter: Payer: Self-pay | Admitting: Neurosurgery

## 2012-06-03 VITALS — BP 127/68 | HR 99 | Resp 20 | Ht 70.5 in | Wt 241.0 lb

## 2012-06-03 DIAGNOSIS — M7989 Other specified soft tissue disorders: Secondary | ICD-10-CM

## 2012-06-03 DIAGNOSIS — R609 Edema, unspecified: Secondary | ICD-10-CM

## 2012-06-03 DIAGNOSIS — I739 Peripheral vascular disease, unspecified: Secondary | ICD-10-CM

## 2012-06-03 DIAGNOSIS — IMO0001 Reserved for inherently not codable concepts without codable children: Secondary | ICD-10-CM

## 2012-06-03 NOTE — Progress Notes (Signed)
[facility] [place] Office Note  CC: Left leg pain and swelling Referring Physician: Brabham  History of Present Illness: This is a patient of Dr. Myra Gianotti is status post aortobifemoral bypass graft and right femoral to above-the-knee popliteal artery bypass graft [employer] in may of 2013. The patient was last seen by Dr. Myra Gianotti 05/19/2012. The patient does need a day for the last 2-3 weeks her left leg has been swelling and red and. She has not had any injury and reports no problem prior to the onset of this difficulty. The patient called the office asking for evaluation and was brought in for venous duplex to rule out DVT. Past Medical History  Diagnosis Date  . DVT (deep venous thrombosis)     DVT HISTORY  . Diabetes mellitus   . Migraine     HISTORY  . Anxiety and depression   . CAD (coronary artery disease)     Last cath 2009:  LAD stent 80% stenosis. Circumflex 60-70% stenosis AV groove, right coronary artery 50-60% stenosis. She did have cutting balloon angioplasty of the LAD lesion into a diagonal. However, there was restenosis of this and it was managed medically.  . Bipolar 1 disorder   . Hyperlipidemia   . Hyperparathyroidism   . PE (pulmonary embolism)     HISTORY  . Dialysis patient   . Anemia   . Renal failure     dialysis [place] t,th sat  . Nephrotic syndrome   . Peripheral vascular disease   . MRSA infection   . Anxiety   . Depression   . Gastroparesis   . Complication of anesthesia   . PONV (postoperative nausea and vomiting)     x3 days post anesth. on 12/19/2011  . Myocardial infarction     x3 , last one - 10 yrs. ago  . Hypertension     sees Dr. Antoine Poche - [place], last stress test 11/2011, see result in EPIC, saw Dr. Antoine Poche as well at that time    . Shortness of breath   . Recurrent upper respiratory infection (URI)     Bronchitis 11/2011 - saw Dr. Loney Hering, treating currently /w antibiotic   . GERD (gastroesophageal reflux  disease)   . Pneumonia     APH- 2012  . Noncompliance of patient with renal dialysis     ROS: [x]  Positive   [ ]  Denies    General: [ ]  Weight loss, [ ]  Fever, [ ]  chills Neurologic: [ ]  Dizziness, [ ]  Blackouts, [ ]  Seizure [ ]  Stroke, [ ]  "Mini stroke", [ ]  Slurred speech, [ ]  Temporary blindness; [ ]  weakness in arms or legs, [ ]  Hoarseness Cardiac: [ ]  Chest pain/pressure, [ ]  Shortness of breath at rest [ ]  Shortness of breath with exertion, [ ]  Atrial fibrillation or irregular heartbeat Vascular: [ ]  Pain in legs with walking, [ ]  Pain in legs at rest, [ ]  Pain in legs at night,  [ ]  Non-healing ulcer, [ ]  Blood clot in vein/DVT,   Pulmonary: [ ]  Home oxygen, [ ]  Productive cough, [ ]  Coughing up blood, [ ]  Asthma,  [ ]  Wheezing Musculoskeletal:  [ ]  Arthritis, [ ]  Low back pain, [ ]  Joint pain Hematologic: [ ]  Easy Bruising, [ ]  Anemia; [ ]  Hepatitis Gastrointestinal: [ ]  Blood in stool, [ ]  Gastroesophageal Reflux/heartburn, [ ]  Trouble swallowing Urinary: [ ]  chronic Kidney disease, [ ]  on HD - [ ]  MWF or [ ]  TTHS, [ ]   Burning with urination, [ ]  Difficulty urinating Skin: [ ]  Rashes, [ ]  Wounds Psychological: [ ]  Anxiety, [ ]  Depression   Social History History  Substance Use Topics  . Smoking status: Current Every Day Smoker -- 1.0 packs/day for 20 years    Types: Cigarettes  . Smokeless tobacco: Never Used   Comment: pt states she is not ready to quit   . Alcohol Use: No    Family History Family History  Problem Relation Age of Onset  . Transient ischemic attack Mother   . Hepatitis Sister   . Diabetes type II Other   . Anesthesia problems Neg Hx   . Hypotension Neg Hx   . Malignant hyperthermia Neg Hx   . Pseudochol deficiency Neg Hx     Allergies  Allergen Reactions  . Compazine (Prochlorperazine) Swelling    Per echart records  . Contrast Media (Iodinated Diagnostic Agents) Swelling    Per echart records  . Meperidine Hcl Swelling  . Metformin And  Related Other (See Comments)    'just not myself, was told not to take metformin'  . Morphine Swelling  . Nubain (Nalbuphine Hcl) Swelling  . Tobramycin   . Topiramate (Topamax) Swelling    Tongue swelling - per echart records  . Toradol (Ketorolac Tromethamine) Swelling    Current Outpatient Prescriptions  Medication Sig Dispense Refill  . albuterol (PROVENTIL HFA;VENTOLIN HFA) 108 (90 BASE) MCG/ACT inhaler Inhale 2 puffs into the lungs every 6 (six) hours as needed. Shortness of breath      . ALPRAZolam (XANAX) 1 MG tablet Take 1 tablet (1 mg total) by mouth 3 (three) times daily as needed. For anxiety  60 tablet  0  . ARIPiprazole (ABILIFY) 2 MG tablet Take 10 mg by mouth daily.       . Aspirin-Salicylamide-Caffeine (BC HEADACHE POWDER PO) Take 0.5 packets by mouth daily after breakfast. headache      . b complex-vitamin c-folic acid (NEPHRO-VITE) 0.8 MG TABS Take 0.8 mg by mouth at bedtime.      Marland Kitchen BACITRACIN OP Place 1 drop into both eyes 6 (six) times daily.      . butalbital-acetaminophen-caffeine (FIORICET, ESGIC) 50-325-40 MG per tablet Take 1 tablet by mouth 3 (three) times daily after meals. For headaches  45 tablet  0  . cinacalcet (SENSIPAR) 30 MG tablet Take 30 mg by mouth daily with supper.       . clopidogrel (PLAVIX) 75 MG tablet Take 75 mg by mouth daily. Last dose 12/17/2011      . collagenase (SANTYL) ointment Apply 1 application topically daily.      . darbepoetin (ARANESP) 60 MCG/0.3ML SOLN Inject 60 mcg into the vein every Saturday with hemodialysis.      Marland Kitchen esomeprazole (NEXIUM) 40 MG capsule Take 1 capsule (40 mg total) by mouth daily.  30 capsule  5  . Eyelid Cleansers (SYSTANE LID WIPES) PADS Place 1 application into the right eye daily.      Marland Kitchen gentamicin (GARAMYCIN) 0.3 % ophthalmic solution Place 1 drop into both eyes 4 (four) times daily.      Marland Kitchen HOMATROPAIRE 5 % ophthalmic solution Apply 1 drop to eye.      . insulin glargine (LANTUS) 100 UNIT/ML injection Inject  33 Units into the skin at bedtime.      . insulin lispro (HUMALOG) 100 UNIT/ML injection Inject 7-15 Units into the skin 4 (four) times daily -  before meals and at bedtime. TAKING ON SLIDING SCALE  200-249=7 units 250-299=10 units 300-349=12 units >350=15 units      . metoCLOPramide (REGLAN) 10 MG tablet Take 10 mg by mouth 4 (four) times daily.      Marland Kitchen omeprazole (PRILOSEC) 40 MG capsule Take 40 mg by mouth daily.       Marland Kitchen oxyCODONE-acetaminophen (PERCOCET) 10-325 MG per tablet Take 0.5 tablets by mouth every 4 (four) hours as needed. For pain  65 tablet  0  . PARoxetine (PAXIL) 40 MG tablet Take 80 mg by mouth every morning.       . prednisoLONE acetate (PRED FORTE) 1 % ophthalmic suspension Place 1 drop into both eyes.      . promethazine (PHENERGAN) 25 MG tablet TAKE ONE TABLET BY MOUTH EVERY 6 HOURS AS NEEDED FOR NAUSEA  120 tablet  0  . pyridoxine (B-6) 100 MG tablet Take 100 mg by mouth daily.      . sevelamer (RENVELA) 800 MG tablet Take 1,600-4,000 mg by mouth 5 (five) times daily. *Take 5 tablets daily with each meal and take 2 tablets with snacks**      . VANCOMYCIN HCL PO Place 1 drop into both eyes 6 (six) times daily. Vancomycin 50mg /ml opthalmic [facility]      . VIGAMOX 0.5 % ophthalmic solution Apply 1 drop to eye.        Physical Examination  Filed Vitals:   06/03/12 1529  BP: 127/68  Pulse: 99  Resp: 20    Body mass index is 34.09 kg/(m^2).  General:  WDWN in NAD Gait: Normal HEENT: WNL Eyes: Pupils equal Pulmonary: normal non-labored breathing , without Rales, rhonchi,  wheezing Cardiac: RRR, without  Murmurs, rubs or gallops; No carotid bruits Abdomen: soft, NT, no masses Skin: no rashes, ulcers noted Vascular Exam/Pulses: Pulses are not palpable G-tube edema in the left lower extremity  Extremities without ischemic changes, no Gangrene , no cellulitis; no open wounds;  Musculoskeletal: no muscle wasting or  atrophy  Neurologic: A&O X 3; Appropriate Affect ; SENSATION: normal; MOTOR FUNCTION:  moving all extremities equally. Speech is fluent/normal  Non-Invasive Vascular Imaging: Left lower extremity duplex shows no evidence of DVT, there is a 74 .4 x 2.5 cm fluid collection visualized the proximal thigh.  ASSESSMENT/PLAN: Left lower extremity redness and swelling, probable cellulitis. I spoke with Dr. Myra Gianotti who recommended the patient come to the hospital for evaluation and possible IV vancomycin therapy. The patient has requested to stay outpatient. Dr. Myra Gianotti has agreed for the patient to have IV vancomycin with her hemodialysis. Our triage nurse is working to try to get the vancomycin dosed through hemodialysis. Patient will followup here on Monday, October 14 in my clinic and be seen by Dr. Myra Gianotti. The patient's in agreement with this, her questions were encouraged and answered.  Lauree Chandler ANP   Clinic M.D.: [facility]

## 2012-06-05 ENCOUNTER — Encounter (HOSPITAL_COMMUNITY): Payer: Self-pay | Admitting: *Deleted

## 2012-06-05 ENCOUNTER — Emergency Department (HOSPITAL_COMMUNITY): Payer: Medicare Other

## 2012-06-05 ENCOUNTER — Inpatient Hospital Stay (HOSPITAL_COMMUNITY)
Admission: EM | Admit: 2012-06-05 | Discharge: 2012-06-08 | DRG: 871 | Disposition: A | Discharge status: Home / Self Care | Payer: Medicare Other | Attending: Family Medicine | Admitting: Family Medicine

## 2012-06-05 DIAGNOSIS — E1142 Type 2 diabetes mellitus with diabetic polyneuropathy: Secondary | ICD-10-CM | POA: Diagnosis present

## 2012-06-05 DIAGNOSIS — Z823 Family history of stroke: Secondary | ICD-10-CM

## 2012-06-05 DIAGNOSIS — F319 Bipolar disorder, unspecified: Secondary | ICD-10-CM | POA: Diagnosis present

## 2012-06-05 DIAGNOSIS — E1149 Type 2 diabetes mellitus with other diabetic neurological complication: Secondary | ICD-10-CM | POA: Diagnosis present

## 2012-06-05 DIAGNOSIS — I2510 Atherosclerotic heart disease of native coronary artery without angina pectoris: Secondary | ICD-10-CM | POA: Diagnosis present

## 2012-06-05 DIAGNOSIS — Z9861 Coronary angioplasty status: Secondary | ICD-10-CM

## 2012-06-05 DIAGNOSIS — Z794 Long term (current) use of insulin: Secondary | ICD-10-CM

## 2012-06-05 DIAGNOSIS — I120 Hypertensive chronic kidney disease with stage 5 chronic kidney disease or end stage renal disease: Secondary | ICD-10-CM | POA: Diagnosis present

## 2012-06-05 DIAGNOSIS — E1143 Type 2 diabetes mellitus with diabetic autonomic (poly)neuropathy: Secondary | ICD-10-CM | POA: Diagnosis present

## 2012-06-05 DIAGNOSIS — L02419 Cutaneous abscess of limb, unspecified: Secondary | ICD-10-CM

## 2012-06-05 DIAGNOSIS — F17200 Nicotine dependence, unspecified, uncomplicated: Secondary | ICD-10-CM | POA: Diagnosis present

## 2012-06-05 DIAGNOSIS — Z7902 Long term (current) use of antithrombotics/antiplatelets: Secondary | ICD-10-CM

## 2012-06-05 DIAGNOSIS — N186 End stage renal disease: Secondary | ICD-10-CM | POA: Diagnosis present

## 2012-06-05 DIAGNOSIS — L03119 Cellulitis of unspecified part of limb: Secondary | ICD-10-CM

## 2012-06-05 DIAGNOSIS — Z79899 Other long term (current) drug therapy: Secondary | ICD-10-CM

## 2012-06-05 DIAGNOSIS — Z86711 Personal history of pulmonary embolism: Secondary | ICD-10-CM

## 2012-06-05 DIAGNOSIS — E118 Type 2 diabetes mellitus with unspecified complications: Secondary | ICD-10-CM | POA: Diagnosis present

## 2012-06-05 DIAGNOSIS — I872 Venous insufficiency (chronic) (peripheral): Secondary | ICD-10-CM | POA: Diagnosis present

## 2012-06-05 DIAGNOSIS — I739 Peripheral vascular disease, unspecified: Secondary | ICD-10-CM | POA: Diagnosis present

## 2012-06-05 DIAGNOSIS — Z8614 Personal history of Methicillin resistant Staphylococcus aureus infection: Secondary | ICD-10-CM

## 2012-06-05 DIAGNOSIS — E109 Type 1 diabetes mellitus without complications: Secondary | ICD-10-CM

## 2012-06-05 DIAGNOSIS — I252 Old myocardial infarction: Secondary | ICD-10-CM

## 2012-06-05 DIAGNOSIS — I70209 Unspecified atherosclerosis of native arteries of extremities, unspecified extremity: Secondary | ICD-10-CM | POA: Diagnosis present

## 2012-06-05 DIAGNOSIS — N2581 Secondary hyperparathyroidism of renal origin: Secondary | ICD-10-CM | POA: Diagnosis present

## 2012-06-05 DIAGNOSIS — Z91199 Patient's noncompliance with other medical treatment and regimen due to unspecified reason: Secondary | ICD-10-CM

## 2012-06-05 DIAGNOSIS — L03116 Cellulitis of left lower limb: Secondary | ICD-10-CM | POA: Diagnosis present

## 2012-06-05 DIAGNOSIS — D631 Anemia in chronic kidney disease: Secondary | ICD-10-CM | POA: Diagnosis present

## 2012-06-05 DIAGNOSIS — K3184 Gastroparesis: Secondary | ICD-10-CM | POA: Diagnosis present

## 2012-06-05 DIAGNOSIS — A419 Sepsis, unspecified organism: Secondary | ICD-10-CM

## 2012-06-05 DIAGNOSIS — Z91158 Patient's noncompliance with renal dialysis for other reason: Secondary | ICD-10-CM

## 2012-06-05 DIAGNOSIS — Z992 Dependence on renal dialysis: Secondary | ICD-10-CM

## 2012-06-05 DIAGNOSIS — K219 Gastro-esophageal reflux disease without esophagitis: Secondary | ICD-10-CM | POA: Diagnosis present

## 2012-06-05 DIAGNOSIS — Z86718 Personal history of other venous thrombosis and embolism: Secondary | ICD-10-CM

## 2012-06-05 DIAGNOSIS — Z9115 Patient's noncompliance with renal dialysis: Secondary | ICD-10-CM

## 2012-06-05 DIAGNOSIS — Z9119 Patient's noncompliance with other medical treatment and regimen: Secondary | ICD-10-CM

## 2012-06-05 DIAGNOSIS — Z833 Family history of diabetes mellitus: Secondary | ICD-10-CM

## 2012-06-05 DIAGNOSIS — Z720 Tobacco use: Secondary | ICD-10-CM | POA: Diagnosis present

## 2012-06-05 DIAGNOSIS — L98499 Non-pressure chronic ulcer of skin of other sites with unspecified severity: Secondary | ICD-10-CM | POA: Diagnosis present

## 2012-06-05 LAB — BASIC METABOLIC PANEL
BUN: 20 mg/dL (ref 6–23)
Calcium: 9.1 mg/dL (ref 8.4–10.5)
GFR calc Af Amer: 18 mL/min — ABNORMAL LOW (ref >90)
GFR calc non Af Amer: 16 mL/min — ABNORMAL LOW (ref >90)
Glucose, Bld: 202 mg/dL — ABNORMAL HIGH (ref 70–99)
Potassium: 4 mEq/L (ref 3.5–5.1)
Sodium: 134 mEq/L — ABNORMAL LOW (ref 135–145)

## 2012-06-05 LAB — CBC WITH DIFFERENTIAL/PLATELET
Basophils Relative: 0 % (ref 0–1)
Eosinophils Absolute: 0.1 10*3/uL (ref 0.0–0.7)
Eosinophils Relative: 1 % (ref 0–5)
Lymphs Abs: 0.7 10*3/uL (ref 0.7–4.0)
MCH: 31.1 pg (ref 26.0–34.0)
MCHC: 31.4 g/dL (ref 30.0–36.0)
MCV: 99 fL (ref 78.0–100.0)
Neutrophils Relative %: 89 % — ABNORMAL HIGH (ref 43–77)
Platelets: 296 10*3/uL (ref 150–400)

## 2012-06-05 LAB — SEDIMENTATION RATE: Sed Rate: 91 mm/hr — ABNORMAL HIGH (ref 0–22)

## 2012-06-05 MED ORDER — INSULIN GLARGINE 100 UNIT/ML ~~LOC~~ SOLN
33.0000 [IU] | Freq: Every day | SUBCUTANEOUS | Status: DC
  Administered 2012-06-06: 33 [IU] via SUBCUTANEOUS
  Administered 2012-06-06: 23:00:00 via SUBCUTANEOUS
  Administered 2012-06-07: 33 [IU] via SUBCUTANEOUS
  Filled 2012-06-05: qty 1

## 2012-06-05 MED ORDER — ENOXAPARIN SODIUM 30 MG/0.3ML ~~LOC~~ SOLN
30.0000 mg | SUBCUTANEOUS | Status: DC
  Administered 2012-06-06 – 2012-06-08 (×3): 30 mg via SUBCUTANEOUS
  Filled 2012-06-05 (×4): qty 0.3

## 2012-06-05 MED ORDER — PIPERACILLIN-TAZOBACTAM IN DEX 2-0.25 GM/50ML IV SOLN
2.2500 g | Freq: Three times a day (TID) | INTRAVENOUS | Status: DC
  Administered 2012-06-06 – 2012-06-08 (×8): 2.25 g via INTRAVENOUS
  Filled 2012-06-05 (×11): qty 50

## 2012-06-05 MED ORDER — ALPRAZOLAM 0.5 MG PO TABS
1.0000 mg | ORAL_TABLET | Freq: Three times a day (TID) | ORAL | Status: DC | PRN
  Administered 2012-06-06 – 2012-06-08 (×5): 1 mg via ORAL
  Filled 2012-06-05: qty 2
  Filled 2012-06-05: qty 1
  Filled 2012-06-05: qty 2
  Filled 2012-06-05 (×3): qty 1
  Filled 2012-06-05: qty 2

## 2012-06-05 MED ORDER — SODIUM CHLORIDE 0.9 % IV SOLN
INTRAVENOUS | Status: DC
  Administered 2012-06-05: 17:00:00 via INTRAVENOUS
  Administered 2012-06-05: 125 mL/h via INTRAVENOUS

## 2012-06-05 MED ORDER — FUROSEMIDE 10 MG/ML IJ SOLN
80.0000 mg | Freq: Once | INTRAMUSCULAR | Status: AC
  Administered 2012-06-06: 80 mg via INTRAVENOUS
  Filled 2012-06-05: qty 8

## 2012-06-05 MED ORDER — NICOTINE 21 MG/24HR TD PT24
21.0000 mg | MEDICATED_PATCH | Freq: Every day | TRANSDERMAL | Status: DC
  Administered 2012-06-06 – 2012-06-08 (×4): 21 mg via TRANSDERMAL
  Filled 2012-06-05 (×4): qty 1

## 2012-06-05 MED ORDER — METOCLOPRAMIDE HCL 10 MG PO TABS
10.0000 mg | ORAL_TABLET | Freq: Four times a day (QID) | ORAL | Status: DC
  Administered 2012-06-06 – 2012-06-08 (×8): 10 mg via ORAL
  Filled 2012-06-05 (×13): qty 1

## 2012-06-05 MED ORDER — BACITRACIN-POLYMYXIN B 500-10000 UNIT/GM OP OINT
TOPICAL_OINTMENT | Freq: Every day | OPHTHALMIC | Status: DC
  Administered 2012-06-06 – 2012-06-07 (×2): via OPHTHALMIC
  Filled 2012-06-05: qty 3.5

## 2012-06-05 MED ORDER — PROMETHAZINE HCL 25 MG PO TABS
25.0000 mg | ORAL_TABLET | Freq: Four times a day (QID) | ORAL | Status: DC | PRN
  Administered 2012-06-06 – 2012-06-07 (×3): 25 mg via ORAL
  Filled 2012-06-05 (×3): qty 1

## 2012-06-05 MED ORDER — CLOPIDOGREL BISULFATE 75 MG PO TABS
75.0000 mg | ORAL_TABLET | Freq: Every day | ORAL | Status: DC
  Administered 2012-06-06 – 2012-06-08 (×3): 75 mg via ORAL
  Filled 2012-06-05 (×3): qty 1

## 2012-06-05 MED ORDER — ALBUTEROL SULFATE HFA 108 (90 BASE) MCG/ACT IN AERS: 2.0000 | INHALATION_SPRAY | Freq: Four times a day (QID) | RESPIRATORY_TRACT | Status: DC | PRN

## 2012-06-05 MED ORDER — PANTOPRAZOLE SODIUM 40 MG PO TBEC
40.0000 mg | DELAYED_RELEASE_TABLET | Freq: Every day | ORAL | Status: DC
  Administered 2012-06-06 – 2012-06-08 (×3): 40 mg via ORAL
  Filled 2012-06-05 (×3): qty 1

## 2012-06-05 MED ORDER — OFLOXACIN 0.3 % OP SOLN
1.0000 [drp] | Freq: Three times a day (TID) | OPHTHALMIC | Status: DC
  Administered 2012-06-06 – 2012-06-08 (×7): 1 [drp] via OPHTHALMIC
  Filled 2012-06-05 (×2): qty 5

## 2012-06-05 MED ORDER — METHOCARBAMOL 500 MG PO TABS
500.0000 mg | ORAL_TABLET | Freq: Two times a day (BID) | ORAL | Status: DC
  Administered 2012-06-06 – 2012-06-08 (×5): 500 mg via ORAL
  Filled 2012-06-05 (×6): qty 1

## 2012-06-05 MED ORDER — DARBEPOETIN ALFA-POLYSORBATE 60 MCG/0.3ML IJ SOLN
60.0000 ug | INTRAMUSCULAR | Status: DC
  Administered 2012-06-07: 60 ug via INTRAVENOUS
  Filled 2012-06-05: qty 0.3

## 2012-06-05 MED ORDER — CINACALCET HCL 30 MG PO TABS
30.0000 mg | ORAL_TABLET | Freq: Every day | ORAL | Status: DC
  Administered 2012-06-07: 30 mg via ORAL
  Filled 2012-06-05 (×3): qty 1

## 2012-06-05 MED ORDER — OXYCODONE-ACETAMINOPHEN 10-325 MG PO TABS: 1.0000 | ORAL_TABLET | ORAL | Status: DC | PRN

## 2012-06-05 MED ORDER — ASPIRIN 300 MG RE SUPP: 300.0000 mg | RECTAL | Status: AC

## 2012-06-05 MED ORDER — ASPIRIN 81 MG PO CHEW
324.0000 mg | CHEWABLE_TABLET | ORAL | Status: AC
  Administered 2012-06-06: 324 mg via ORAL
  Filled 2012-06-05: qty 4

## 2012-06-05 MED ORDER — RENA-VITE PO TABS
1.0000 | ORAL_TABLET | Freq: Every day | ORAL | Status: DC
  Administered 2012-06-06 – 2012-06-08 (×3): 1 via ORAL
  Filled 2012-06-05 (×4): qty 1

## 2012-06-05 MED ORDER — SEVELAMER CARBONATE 800 MG PO TABS: 1600.0000 mg | ORAL_TABLET | Freq: Every day | ORAL | Status: DC

## 2012-06-05 MED ORDER — VITAMIN B-6 100 MG PO TABS
100.0000 mg | ORAL_TABLET | Freq: Every day | ORAL | Status: DC
  Administered 2012-06-06 – 2012-06-08 (×3): 100 mg via ORAL
  Filled 2012-06-05 (×4): qty 1

## 2012-06-05 MED ORDER — PAROXETINE HCL 30 MG PO TABS
80.0000 mg | ORAL_TABLET | Freq: Every day | ORAL | Status: DC
  Administered 2012-06-06 – 2012-06-08 (×3): 80 mg via ORAL
  Filled 2012-06-05 (×3): qty 1

## 2012-06-05 MED ORDER — FLUTICASONE PROPIONATE 50 MCG/ACT NA SUSP
1.0000 | Freq: Every day | NASAL | Status: DC
  Administered 2012-06-07 – 2012-06-08 (×2): 1 via NASAL
  Filled 2012-06-05: qty 16

## 2012-06-05 MED ORDER — VANCOMYCIN HCL 1000 MG IV SOLR
2000.0000 mg | Freq: Once | INTRAVENOUS | Status: AC
  Administered 2012-06-05: 2000 mg via INTRAVENOUS
  Filled 2012-06-05: qty 2000

## 2012-06-05 MED ORDER — BUTALBITAL-APAP-CAFFEINE 50-325-40 MG PO TABS: 1.0000 | ORAL_TABLET | Freq: Three times a day (TID) | ORAL | Status: DC

## 2012-06-05 MED ORDER — ARIPIPRAZOLE 10 MG PO TABS
10.0000 mg | ORAL_TABLET | Freq: Every day | ORAL | Status: DC
  Administered 2012-06-06 – 2012-06-08 (×3): 10 mg via ORAL
  Filled 2012-06-05 (×4): qty 1

## 2012-06-05 MED ORDER — INSULIN ASPART 100 UNIT/ML ~~LOC~~ SOLN: 7.0000 [IU] | Freq: Three times a day (TID) | SUBCUTANEOUS | Status: DC

## 2012-06-05 MED ORDER — SODIUM CHLORIDE 0.9 % IV SOLN: 250.0000 mL | INTRAVENOUS | Status: DC | PRN

## 2012-06-05 NOTE — ED Provider Notes (Signed)
History  This chart was scribed for Flint Melter, MD by Ardeen Jourdain. This patient was seen in room TR07C/TR07C and the patient's care was started at 1640.  CSN: [id number]  Arrival date & time 06/05/12  1449   None     Chief Complaint  Patient presents with  . Rash  . Cellulitis    The history is provided by the patient. No language interpreter was used.    NANCYE GRUMBINE is a 41 y.o. female who presents to the Emergency Department complaining of abscesses on spine, bottom, leg and neck with associated back pain and abdominal pain. She denies fever and emesis. She sought medical treatment Tuesday for the cellulitis, however she refused to be admitted and went home with no medications. She was receiving dialysis in [place] today, they offered her vancomycin for the cellulitis which she also refused. Upon leaving dialysis, it was reccommended to her to come to the ED here. She has a h/o MRSA, DM and DVT. She is a current everyday smoker but denies alcohol use.   Past Medical History  Diagnosis Date  . DVT (deep venous thrombosis)     DVT HISTORY  . Diabetes mellitus   . Migraine     HISTORY  . Anxiety and depression   . CAD (coronary artery disease)     Last cath 2009:  LAD stent 80% stenosis. Circumflex 60-70% stenosis AV groove, right coronary artery 50-60% stenosis. She did have cutting balloon angioplasty of the LAD lesion into a diagonal. However, there was restenosis of this and it was managed medically.  . Bipolar 1 disorder   . Hyperlipidemia   . Hyperparathyroidism   . PE (pulmonary embolism)     HISTORY  . Dialysis patient   . Anemia   . Renal failure     dialysis [place] t,th sat  . Nephrotic syndrome   . Peripheral vascular disease   . MRSA infection   . Anxiety   . Depression   . Gastroparesis   . Complication of anesthesia   . PONV (postoperative nausea and vomiting)     x3 days post anesth. on 12/19/2011  . Myocardial infarction     x3 , last one - 10  yrs. ago  . Hypertension     sees Dr. Antoine Poche - [place], last stress test 11/2011, see result in EPIC, saw Dr. Antoine Poche as well at that time    . Shortness of breath   . Recurrent upper respiratory infection (URI)     Bronchitis 11/2011 - saw Dr. Loney Hering, treating currently /w antibiotic   . GERD (gastroesophageal reflux disease)   . Pneumonia     [facility]- 2012  . Noncompliance of patient with renal dialysis     Past Surgical History  Procedure Date  . Incise and drain abcess     OF THIGHS FROM INSULIN INJECTIONS  . Heart stents   . Portacath placement 2003  . Cataract extraction w/phaco 11/09/2011    Procedure: CATARACT EXTRACTION PHACO AND INTRAOCULAR LENS PLACEMENT (IOC);  Surgeon: Edmon Crape, MD;  Location[facility] OR;  Service: Ophthalmology;  Laterality: Right;  . Pars plana vitrectomy 11/09/2011    Procedure: PARS PLANA VITRECTOMY WITH 23 GAUGE;  Surgeon: Edmon Crape, MD;  Location: [facility] OR;  Service: Ophthalmology;  Laterality: Right;  Ahmed Valve; Scleral Reinforcement Graft  . Dg av dialysis  shunt access exist*l* or 12/13/11    left arm  . Dialysis cath inserted & portacath  dialysis catheter inserted & portacath d/c'd- 12/19/2011  . Back surgery X 4    x4 times   . Cholecystectomy   . Eye surgery   . Cardiac catheterization   . Aorta - bilateral femoral artery bypass graft 12/28/2011    Procedure: AORTA BIFEMORAL BYPASS GRAFT;  Surgeon: Nada Libman, MD;  Location: [facility] OR;  Service: Vascular;  Laterality: N/A;  AortoBifemoral Bypass using hemasheild 14x7 graft  . Femoral-popliteal bypass graft 12/28/2011    Procedure: BYPASS GRAFT FEMORAL-POPLITEAL ARTERY;  Surgeon: Nada Libman, MD;  Location: [facility] OR;  Service: Vascular;  Laterality: Right;  right femoral popliteal bypass using 6x80 propaten graft  . Insertion of dialysis catheter 02/15/2012    Procedure: INSERTION OF DIALYSIS CATHETER;  Surgeon: Chuck Hint, MD;  Location: [facility] OR;  Service: Vascular;  Laterality:  Right;  insertion of dialysis catheter right internal jugular vein,removal of left internal jugular vein dialysis catheter  . Insertion of dialysis catheter 02/21/2012    Procedure: INSERTION OF DIALYSIS CATHETER;  Surgeon: Chuck Hint, MD;  Location: [facility] OR;  Service: Cardiovascular;  Laterality: N/A;  Insertion new dialysis catheter; possible venoplasty of fibrin sheath of  Right IJ catheter  . Venogram 02/21/2012    Procedure: VENOGRAM;  Surgeon: Chuck Hint, MD;  Location: [facility] OR;  Service: Cardiovascular;  Laterality: N/A;  possible venoplasty  . Insertion of dialysis catheter 04/23/2012    Procedure: INSERTION OF DIALYSIS CATHETER;  Surgeon: Sherren Kerns, MD;  Location: [facility] OR;  Service: Vascular;  Laterality: Right;  using a 45fr. x 23 cannon catheter in Right Internal Jugular    Family History  Problem Relation Age of Onset  . Transient ischemic attack Mother   . Hepatitis Sister   . Diabetes type II Other   . Anesthesia problems Neg Hx   . Hypotension Neg Hx   . Malignant hyperthermia Neg Hx   . Pseudochol deficiency Neg Hx     History  Substance Use Topics  . Smoking status: Current Every Day Smoker -- 1.0 packs/day for 20 years    Types: Cigarettes  . Smokeless tobacco: Never Used   Comment: pt states she is not ready to quit   . Alcohol Use: No   No OB history available.    Review of Systems  Gastrointestinal: Positive for abdominal pain.  Musculoskeletal: Positive for back pain.  Skin: Positive for rash. Negative for wound.  All other systems reviewed and are negative.    Allergies  Compazine; Contrast media; Meperidine hcl; Metformin and related; Morphine; Nubain; Tobramycin; Topiramate; and Toradol  Home Medications   Current Outpatient Rx  Name Route Sig Dispense Refill  . ALBUTEROL SULFATE HFA 108 (90 BASE) MCG/ACT IN AERS Inhalation Inhale 2 puffs into the lungs every 6 (six) hours as needed. Shortness of breath    . ALPRAZOLAM 1 MG  PO TABS Oral Take 1 tablet (1 mg total) by mouth 3 (three) times daily as needed. For anxiety 60 tablet 0  . ARIPIPRAZOLE 2 MG PO TABS Oral Take 10 mg by mouth daily.     Marland Kitchen NEPHRO-VITE 0.8 MG PO TABS Oral Take 0.8 mg by mouth at bedtime.    Marland Kitchen BUTALBITAL-APAP-CAFFEINE 50-325-40 MG PO TABS Oral Take 1 tablet by mouth 3 (three) times daily after meals. For headaches 45 tablet 0  . CINACALCET HCL 30 MG PO TABS Oral Take 30 mg by mouth daily with supper.     Marland Kitchen CLOPIDOGREL BISULFATE 75 MG PO  TABS Oral Take 75 mg by mouth daily. Last dose 12/17/2011    . COLLAGENASE 250 UNIT/GM EX OINT Topical Apply 1 application topically daily.    Marland Kitchen DARBEPOETIN ALFA-POLYSORBATE 60 MCG/0.3ML IJ SOLN Intravenous Inject 60 mcg into the vein every Saturday with hemodialysis.    Marland Kitchen METOCLOPRAMIDE HCL 10 MG PO TABS Oral Take 10 mg by mouth 4 (four) times daily.    Marland Kitchen OMEPRAZOLE 40 MG PO CPDR Oral Take 40 mg by mouth daily.     Marland Kitchen PROMETHAZINE HCL 25 MG PO TABS  TAKE ONE TABLET BY MOUTH EVERY 6 HOURS AS NEEDED FOR NAUSEA 120 tablet 0  . GENTAMICIN SULFATE 0.3 % OP SOLN Both Eyes Place 1 drop into both eyes 4 (four) times daily.    Marland Kitchen HOMATROPAIRE 5 % OP SOLN Ophthalmic Apply 1 drop to eye.    . INSULIN GLARGINE 100 UNIT/ML [place] SOLN Subcutaneous Inject 33 Units into the skin at bedtime.    . INSULIN LISPRO (HUMAN) 100 UNIT/ML [place] SOLN Subcutaneous Inject 7-15 Units into the skin 4 (four) times daily -  before meals and at bedtime. TAKING ON SLIDING SCALE 200-249=7 units 250-299=10 units 300-349=12 units >350=15 units    . OXYCODONE-ACETAMINOPHEN 10-325 MG PO TABS Oral Take 0.5 tablets by mouth every 4 (four) hours as needed. For pain 65 tablet 0  . PAROXETINE HCL 40 MG PO TABS Oral Take 80 mg by mouth every morning.     Marland Kitchen PREDNISOLONE ACETATE 1 % OP SUSP Both Eyes Place 1 drop into both eyes.    Marland Kitchen PYRIDOXINE HCL 100 MG PO TABS Oral Take 100 mg by mouth daily.    Marland Kitchen SEVELAMER CARBONATE 800 MG PO TABS Oral Take 1,600-4,000 mg by  mouth 5 (five) times daily. *Take 5 tablets daily with each meal and take 2 tablets with snacks**    . VANCOMYCIN HCL PO Both Eyes Place 1 drop into both eyes 6 (six) times daily. Vancomycin 50mg /ml opthalmic [facility]    . VIGAMOX 0.5 % OP SOLN Ophthalmic Apply 1 drop to eye.      Triage Vitals: BP 96/70  Pulse 112  Temp 98.7 F (37.1 C) (Oral)  Resp 18  SpO2 99%  LMP 09/03/2008  Physical Exam  Nursing note and vitals reviewed. Constitutional: She is oriented to person, place, and time. She appears well-developed and well-nourished. No distress.  HENT:  Head: Normocephalic and atraumatic.       Right conjunctiva mildly inflamed, no discharge  Eyes: EOM are normal. Pupils are equal, round, and reactive to light.  Neck: Normal range of motion. Neck supple. No tracheal deviation present.  Cardiovascular: Normal rate.   Pulmonary/Chest: Effort normal. No respiratory distress.       Dialysis catheter in right upper chest wall  Abdominal: Soft. She exhibits no distension.  Musculoskeletal: Normal range of motion. She exhibits edema and tenderness.       Lumbar tenderness, left leg diffusely swollen, sensation intact distally on legs bilaterally.   Neurological: She is alert and oriented to person, place, and time.  Skin: Skin is warm and dry. Rash noted. There is erythema.       Erythema and tenderness medially from foot of left leg top groin. Firm induration on leg. Bilateral buttocks with several superficial non draining ulcers. 1 cm indurated area with abscess, no fluxants on left buttock. On neck, scattered superficial ulcers.   Psychiatric: She has a normal mood and affect. Her behavior  is normal.    ED Course  Procedures (including critical care time)  DIAGNOSTIC STUDIES: Oxygen Saturation is 99% on room air, normal by my interpretation.    COORDINATION OF CARE:  1700- Discussed treatment plan with pt at bedside and pt agreed to  plan.  Emergency department treatment: Empiric vancomycin for cellulitis.  Initial evaluation: ordered with plain film imaging, left foot. Vascular Doppler to rule out DVT. Blood work.  Patient will be moved to the clinical decision unit pending return of initial evaluation  testing; she will then be referred for admission to a hospital service.  CRITICAL CARE Performed by: Flint Melter   Total critical care time:30 min  Critical care time was exclusive of separately billable procedures and treating other patients.  Critical care was necessary to treat or prevent imminent or life-threatening deterioration.  Critical care was time spent personally by me on the following activities: development of treatment plan with patient and/or surrogate as well as nursing, discussions with consultants, evaluation of patient's response to treatment, examination of patient, obtaining history from patient or surrogate, ordering and performing treatments and interventions, ordering and review of laboratory studies, ordering and review of radiographic studies, pulse oximetry and re-evaluation of patient's condition.    Labs Reviewed  CBC WITH DIFFERENTIAL  BASIC METABOLIC PANEL  CULTURE, BLOOD (ROUTINE X 2)  CULTURE, BLOOD (ROUTINE X 2)   No results found.   1. Cellulitis, leg   2. Diabetes mellitus type I   3. End stage renal disease       MDM  Left leg cellulitis, severe, in patient with complicating factors of end-stage renal disease, and diabetes. She likely has significant vascular problems as well.  She had mild hypotension upon arrival, likely related to the dialysis today.         I personally performed the services described in this documentation, which was scribed in my presence. The recorded information has been reviewed and considered.     Flint Melter, MD 06/05/12 [phone]

## 2012-06-05 NOTE — ED Notes (Signed)
Patient asleep, and family eating at beside.

## 2012-06-05 NOTE — ED Notes (Signed)
Pt moved to CDU # 6. Report given to [facility], [place]

## 2012-06-05 NOTE — ED Notes (Signed)
Pt has several scabbed over sores to buttocks, arms and back of neck. Pt states that she has been scratching at them pt lower ext are firm to touch with edema and redness bilaterally. Pt has healing sore to right outer lower leg.

## 2012-06-05 NOTE — ED Provider Notes (Signed)
Medical screening examination/treatment/procedure(s) were conducted as a shared visit with non-physician practitioner(s) and myself.  I personally evaluated the patient during the encounter  Flint Melter, MD 06/05/12 2241

## 2012-06-05 NOTE — H&P (Signed)
Family Medicine Teaching [facility] Admission History and Physical Service Pager: [phone]  Patient name: [name] Medical record number: [id number] Date of birth: 1970/11/19 Age: 41 y.o. Gender: female  Primary Care Provider: Ernestine Conrad, MD [place], [place])  Chief Complaint: left lower leg swelling, redness, and pain, for 1-2 weeks  Assessment and Plan: [name] is a 41 y.o. year old female with extensive PMH significant for multiple vascular procedures, ESRD on HD, CAD/PAD with previous MI s/p stenting, and DM with chronic neuropathy and poor wound healing presenting with left lower leg redness/swelling/pain for 1-2 weeks diagnosed as probable cellulitis by Dr. Webb Silversmith on 10/8. Decision by pt at that time to remain outpt with IV vanc at HD visits, but was unable to tolerate HD today and did not receive first dose.   ED course: Pt afebrile with slight relative hypotension at 96/70, HR 112. Pt found to have WBC of 21.9 Pt received one dose of vancomycin and was started on NS at 125 mL/h (received 125 ml). BP 110's/70's during FPTS interview/exam. With WBC, BP, tachycardia and with likely source of infection, pt meets sepsis criteria.  1. Cellulitis/sepsis - LLE, likely also component of chronic venous stasis. Circumferential from ankle nearly to knee. Vitals and source of infection meet criteria for sepsis. Responsive to IVF so not severe sepsis.  Plan - Vancomycin per pharmacy, add Zosyn for Pseudomonas coverage. F/u ESR and blood cultures.  Will consider CT/MRI depending on response to IV abx overnight to evaluate for deeper/more extensive skin/soft-tissue infection.  2. ESRD on HD - On TThS schedule in [place], [place]. Baseline Cr appears to be ~3.5-5. Hx of multiple graft/catheter placements. Most recently/currently has right IJ catheter. Tolerated only ~2 hours of HD today due to pain. Weight currently up ~20 kg from reported dry weight. Plan - Continue home renal medications.  Fluid  overloaded on exam but not in respiratory distress. Plan to saline lock IV. Will diuresis, Lasix 80 mg IV x1 (pt on 40 PO daily). Will consult nephrology in the AM for inpt HD.  3. DM type II with multiple complications - Pt on Lantus 33 units qHS, Humalog SSI TID at home, with Reglan for gastroparesis. Last A1c 6.9 in July. Multiple chronic poorly-healing wounds: right LE ulcers, groin incisions from previous bypass surgery, multiple shallow skin ulcerations to neck/shoulders, back, and buttocks. Plan - Continue home Lantus, Humalog SSI, and Reglan. Nursing wound care. Will consider formal wound care consult.  4. HTN - Slight hypotension at presentation, 96/70. Baseline appears 120-140's systolic. Improved in the ED s/p gentle hydration. Monitor with vitals.  5. CAD/PAD with history of MI - S/p stent placement in 2003; cath in 2009 showed some occlusion of stent with angioplasty and subsequent medically managed restenosis. Aortobifemoral graft and above-the-knee femoral-popliteal bypass on the right leg in May 2013. Echo in 01/2012 showed EF of 60-65% with normal systolic function and mild LVH. Plan - Continue home Plavix. Will consider [facility] consult as she was recently seen in their clinic and has a f/u scheduled for Monday.  6. Bipolar disorder/anxiety/chronic pain - Continue home Paxil, Abilify. Xanax PRN. Percocet 10-325 PRN.  7. GERD - Protonix to replace home omeprazole.  7. Hx of migraines - Fioricet PRN.  8. Seasonal allergies - Continue home Flonase.  9. Hx multiple eye surgeries - Continue home eyedrops (Polysporin, ofloxacin).  FEN/GI: Carb-modified diet. IVF @K[facility]  Prophylaxis/PRN: Lovenox, renal-adjusted dose. Phenergan. Disposition: Admit to FPTS, step-down unit, with discharge pnding clinical work-up/improvement  with treatments as above. Code Status: Full  History of Present Illness: [name] is a 41 y.o. year old female with very extensive PMH significant for multiple  vascular procedures, ESRD on HD, CAD/previous MI s/p stenting, and DM presenting with 1-2 weeks of left lower leg redness, swelling, and pain. Pt also complains of "very painful bumps on her head and bottom" that have been present for about one month. Pt was seen by [facility] (Dr. Webb Silversmith) on 10/8 and diagnosed with probable cellulitis; Drs. Danford Bad suggested hospital admission but pt requested to stay outpt with plans to treat with vancomycin IV at HD appointments. Pt is a HD on TThS patient and was unable to complete her dialysis treatment today secondary to pain in her leg and her bottom, so she did not receive her first dose of vancomycin. Pt also complains of right leg weakness/pain for the past year to the point of "having trouble moving it."  Pt's family (mother and sister) present in room, assist with history; pt is appropriately responsive but provides little of her own history. Describes chills at home for "a few weeks," occasional night sweats. Poor appetite but keeping fluid intake up. Occasional headaches. Sore throat for about 1 month (intubated in hospital last month for fluid overload, had missed a few HD appointments and required new dialysis port). Pt makes very little urine. Some vomiting (Tuesday before dialysis; pt states she occasionally has N/V secondary to gastroparesis). No SOB or chest pain. No diarrhea/constipation or abdominal pain. Complains of chronic sore on right leg for "about a year and a half" for which she has had recent bypass grafting in May of 2013, with poor healing of groin especially on right side, since then.  For sores on neck/shoulders and bottom, pt has used Santyl cream at home. Recently stopped per home health nurse. Previously had home PT from previous hospitalization but "didn't through well with it," per sister.  Patient Active Problem List  Diagnosis  . DIABETES  . HYPERTENSION, UNSPECIFIED  . CAD, NATIVE VESSEL  . DVT  . SHORTNESS OF BREATH  .  PRECORDIAL PAIN  . Diabetic gastroparesis  . Claudication  . Carotid bruit  . Other disorder of muscle, ligament, and fascia  . ESRD on dialysis  . DM (diabetes mellitus), type 2 with complications  . Ulcers of both lower extremities  . PNA (pneumonia)  . PVD (peripheral vascular disease)  . Tobacco abuse  . Hyperparathyroidism, secondary renal  . Bipolar 1 disorder  . Normocytic anemia  . Diabetic leg ulcer  . UTI (lower urinary tract infection)  . Circulation problem  . Carotid artery stenosis, asymptomatic  . Atherosclerosis of native arteries of the extremities with intermittent claudication  . Sepsis  . Altered mental status  . Hypoxemia  . HTN (hypertension)  . Diabetes mellitus  . Chest pain  . D-dimer, elevated  . Atherosclerosis of native arteries of the extremities with ulceration  . SVC syndrome  . Hyperkalemia  . Pleural effusion  . Acute respiratory failure   Past Medical History: Past Medical History  Diagnosis Date  . DVT (deep venous thrombosis)     DVT HISTORY  . Diabetes mellitus   . Migraine     HISTORY  . Anxiety and depression   . CAD (coronary artery disease)     Last cath 2009:  LAD stent 80% stenosis. Circumflex 60-70% stenosis AV groove, right coronary artery 50-60% stenosis. She did have cutting balloon angioplasty of the  LAD lesion into a diagonal. However, there was restenosis of this and it was managed medically.  . Bipolar 1 disorder   . Hyperlipidemia   . Hyperparathyroidism   . PE (pulmonary embolism)     HISTORY  . Dialysis patient   . Anemia   . Renal failure     dialysis [place] t,th sat  . Nephrotic syndrome   . Peripheral vascular disease   . MRSA infection   . Anxiety   . Depression   . Gastroparesis   . Complication of anesthesia   . PONV (postoperative nausea and vomiting)     x3 days post anesth. on 12/19/2011  . Myocardial infarction     x3 , last one - 10 yrs. ago  . Hypertension     sees Dr. Antoine Poche - [place],  last stress test 11/2011, see result in EPIC, saw Dr. Antoine Poche as well at that time    . Shortness of breath   . Recurrent upper respiratory infection (URI)     Bronchitis 11/2011 - saw Dr. Loney Hering, treating currently /w antibiotic   . GERD (gastroesophageal reflux disease)   . Pneumonia     [facility]- 2012  . Noncompliance of patient with renal dialysis    Past Surgical History: Past Surgical History  Procedure Date  . Incise and drain abcess     OF THIGHS FROM INSULIN INJECTIONS  . Heart stents   . Portacath placement 2003  . Cataract extraction w/phaco 11/09/2011    Procedure: CATARACT EXTRACTION PHACO AND INTRAOCULAR LENS PLACEMENT (IOC);  Surgeon: Edmon Crape, MD;  Location[facility] OR;  Service: Ophthalmology;  Laterality: Right;  . Pars plana vitrectomy 11/09/2011    Procedure: PARS PLANA VITRECTOMY WITH 23 GAUGE;  Surgeon: Edmon Crape, MD;  Location: [facility] OR;  Service: Ophthalmology;  Laterality: Right;  Ahmed Valve; Scleral Reinforcement Graft  . Dg av dialysis  shunt access exist*l* or 12/13/11    left arm  . Dialysis cath inserted & portacath      dialysis catheter inserted & portacath d/c'd- 12/19/2011  . Back surgery X 4    x4 times   . Cholecystectomy   . Eye surgery   . Cardiac catheterization   . Aorta - bilateral femoral artery bypass graft 12/28/2011    Procedure: AORTA BIFEMORAL BYPASS GRAFT;  Surgeon: Nada Libman, MD;  Location: [facility] OR;  Service: Vascular;  Laterality: N/A;  AortoBifemoral Bypass using hemasheild 14x7 graft  . Femoral-popliteal bypass graft 12/28/2011    Procedure: BYPASS GRAFT FEMORAL-POPLITEAL ARTERY;  Surgeon: Nada Libman, MD;  Location: [facility] OR;  Service: Vascular;  Laterality: Right;  right femoral popliteal bypass using 6x80 propaten graft  . Insertion of dialysis catheter 02/15/2012    Procedure: INSERTION OF DIALYSIS CATHETER;  Surgeon: Chuck Hint, MD;  Location: [facility] OR;  Service: Vascular;  Laterality: Right;  insertion of dialysis catheter  right internal jugular vein,removal of left internal jugular vein dialysis catheter  . Insertion of dialysis catheter 02/21/2012    Procedure: INSERTION OF DIALYSIS CATHETER;  Surgeon: Chuck Hint, MD;  Location: [facility] OR;  Service: Cardiovascular;  Laterality: N/A;  Insertion new dialysis catheter; possible venoplasty of fibrin sheath of  Right IJ catheter  . Venogram 02/21/2012    Procedure: VENOGRAM;  Surgeon: Chuck Hint, MD;  Location: [facility] OR;  Service: Cardiovascular;  Laterality: N/A;  possible venoplasty  . Insertion of dialysis catheter 04/23/2012    Procedure: INSERTION OF DIALYSIS CATHETER;  Surgeon:  Sherren Kerns, MD;  Location: [facility] OR;  Service: Vascular;  Laterality: Right;  using a 62fr. x 23 cannon catheter in Right Internal Jugular   Social History: History  Substance Use Topics  . Smoking status: Current Every Day Smoker -- 1.0 packs/day for 20 years    Types: Cigarettes  . Smokeless tobacco: Never Used   Comment: pt states she is not ready to quit   . Alcohol Use: No   For any additional social history documentation, please refer to relevant sections of EMR.  Family History: Family History  Problem Relation Age of Onset  . Transient ischemic attack Mother   . Hepatitis Sister   . Diabetes type II Other   . Anesthesia problems Neg Hx   . Hypotension Neg Hx   . Malignant hyperthermia Neg Hx   . Pseudochol deficiency Neg Hx    Allergies: Allergies  Allergen Reactions  . Compazine (Prochlorperazine) Swelling    Per echart records  . Contrast Media (Iodinated Diagnostic Agents) Swelling    Per echart records  . Meperidine Hcl Swelling  . Metformin And Related Other (See Comments)    'just not myself, was told not to take metformin'  . Morphine Swelling  . Nubain (Nalbuphine Hcl) Swelling  . Tobramycin   . Topiramate (Topamax) Swelling    Tongue swelling - per echart records  . Toradol (Ketorolac Tromethamine) Swelling   No current  facility-administered medications on file prior to encounter.   Current Outpatient Prescriptions on File Prior to Encounter  Medication Sig Dispense Refill  . albuterol (PROVENTIL HFA;VENTOLIN HFA) 108 (90 BASE) MCG/ACT inhaler Inhale 2 puffs into the lungs every 6 (six) hours as needed. Shortness of breath      . ALPRAZolam (XANAX) 1 MG tablet Take 1 tablet (1 mg total) by mouth 3 (three) times daily as needed. For anxiety  60 tablet  0  . ARIPiprazole (ABILIFY) 2 MG tablet Take 10 mg by mouth daily.       Marland Kitchen b complex-vitamin c-folic acid (NEPHRO-VITE) 0.8 MG TABS Take 0.8 mg by mouth at bedtime.      . butalbital-acetaminophen-caffeine (FIORICET, ESGIC) 50-325-40 MG per tablet Take 1 tablet by mouth 3 (three) times daily after meals. For headaches  45 tablet  0  . cinacalcet (SENSIPAR) 30 MG tablet Take 30 mg by mouth daily with supper.       . clopidogrel (PLAVIX) 75 MG tablet Take 75 mg by mouth daily. Last dose 12/17/2011      . collagenase (SANTYL) ointment Apply 1 application topically daily.      . darbepoetin (ARANESP) 60 MCG/0.3ML SOLN Inject 60 mcg into the vein every Saturday with hemodialysis.      . fluticasone (FLONASE) 50 MCG/ACT nasal spray Place 1 spray into the nose.       . insulin glargine (LANTUS) 100 UNIT/ML injection Inject 33 Units into the skin at bedtime.      . insulin lispro (HUMALOG) 100 UNIT/ML injection Inject 7-15 Units into the skin 4 (four) times daily -  before meals and at bedtime. TAKING ON SLIDING SCALE 200-249=7 units 250-299=10 units 300-349=12 units >350=15 units      . metoCLOPramide (REGLAN) 10 MG tablet Take 10 mg by mouth 4 (four) times daily.      Marland Kitchen omeprazole (PRILOSEC) 40 MG capsule Take 40 mg by mouth daily.       Marland Kitchen PARoxetine (PAXIL) 40 MG tablet Take 80 mg by mouth every morning.       Marland Kitchen  pyridoxine (B-6) 100 MG tablet Take 100 mg by mouth daily.      . sevelamer (RENVELA) 800 MG tablet Take 1,600-4,000 mg by mouth 5 (five) times daily. *Take  5 tablets daily with each meal and take 2 tablets with snacks**       Review Of Systems: Per HPI.  Physical Exam: BP 96/70  Pulse 112  Temp 98.7 F (37.1 C) (Oral)  Resp 18  Ht 5' 10.47" (1.79 m)  Wt 240 lb 15.4 oz (109.3 kg)  BMI 34.11 kg/m2  SpO2 99%  LMP 09/03/2008 Exam: General: obese adult female lying in bed, appears very tired but answers questions appropriately HEENT: [place]/AT, MMM, EOMI, neck supple, no lymphadenopathy Cardiovascular: normal S1, S2, no rub/murmur; heart sounds slightly distant secondary to body habitus Respiratory: CTAB, scant expiratory wheezes bilaterally Abdomen: soft, nontender, obese, large healed midline scar from previous surgery with ~2 cm soft easily reducible paraincisional hernia Extremities: marked bilateral 2+ pitting edema from feet to knees, left LE with circumferential redness and warmth from ankle to just below knee; area of redness very tender to palpation; distal pulses non-palpable but audible on doppler bilaterally Skin:   RLE with two areas of chronic slow-healing ulcerations, one to just below knee and one above ankle  Bilateral poorly-healing ulcerations to groin at previous bypass graft incisional sites, right worse than left  multiple shallow 0.5 cm circular ulcerations to neck/shoulders, upper chest, and bilateral buttocks,   some ulcerations with scabbing, surrounding erythema, very tender  buttock lesions in particular are erythematous and more tender Neuro: alert, oriented, answers questions appropriately  Labs and Imaging:  Lab 06/05/12 1657  WBC 21.9*  HGB 9.1*  HCT 29.0*  PLT 296    Lab 06/05/12 1657  NA 134*  K 4.0  CL 94*  CO2 24  BUN 20  CREATININE 3.39*  CALCIUM 9.1  PROT --  BILITOT --  ALKPHOS --  ALT --  AST --  GLUCOSE 202*   Blood Cx 10/10 @1651 : Pending  CXR, 10/10 @1750  Findings:  Grossly unchanged enlarged cardiac silhouette and mediastinal  contours. Stable position of support apparatus.  Improved aeration  of the bilateral lungs with persistent perihilar heterogeneous  opacities. Mild pulmonary venous congestion without frank evidence  of edema. There is chronic mild elevation of the right  hemidiaphragm. Chronic blunting of the right costophrenic angle  without definite pleural effusion. No pneumothorax. Unchanged  bones.  IMPRESSION:  Chronic pulmonary venous congestion without definite acute  cardiopulmonary disease.  Foot X-ray, LEFT, 10/10 @1750  Findings:  Diffuse osteopenia. No definite fracture. Joint spaces are  preserved. No definite erosions. Pes cavus deformity suspected on  this nonweightbearing radiograph. Minimal enthesopathic change of  the Achilles tendon insertion site. There is mild diffuse soft  tissue swelling about the foot.  IMPRESSION:  Mild diffuse soft tissue swelling about the foot without associate  fracture or dislocation.  Street, [place], MD 06/05/2012, 7:42 PM  I examined the patient with Dr. Casper Harrison. I have reviewed the note, made necessary revisions and agree with above.  Rogene Meth 06/05/12 9:52 PM

## 2012-06-05 NOTE — ED Provider Notes (Signed)
Office note from 06/03/12 at [facility] indicates the following:  "Non-Invasive Vascular Imaging: Left lower extremity duplex shows no evidence of DVT, there is a 74 .4 x 2.5 cm fluid collection visualized the proximal thigh.   ASSESSMENT/PLAN: Left lower extremity redness and swelling, probable cellulitis. I spoke with Dr. Myra Gianotti who recommended the patient come to the hospital for evaluation and possible IV vancomycin therapy. The patient has requested to stay outpatient. Dr. Myra Gianotti has agreed for the patient to have IV vancomycin with her hemodialysis. Our triage nurse is working to try to get the vancomycin dosed through hemodialysis. Patient will followup here on Monday, October 14 in my clinic and be seen by Dr. Myra Gianotti. The patient's in agreement with this, her questions were encouraged and answered.   Lauree Chandler ANP    Clinic M.D.: [facility] [place]"  LE doppler cancelled as patient has recent results.     7:33 PM Patient was at dialysis today, but apparently did not stay to receive her vancomycin. Pharmacist has been consulted for recommended vancomycin dosing.  WBC 21.9.  K 4.0.  Family practice contacted for unassigned admission.    Jimmye Norman, NP 06/05/12 2207

## 2012-06-05 NOTE — Consult Note (Addendum)
ANTIBIOTIC CONSULT NOTE - INITIAL  Pharmacy Consult for Vancomycin Indication: cellulitis  Allergies  Allergen Reactions  . Compazine (Prochlorperazine) Swelling    Per echart records  . Contrast Media (Iodinated Diagnostic Agents) Swelling    Per echart records  . Meperidine Hcl Swelling  . Metformin And Related Other (See Comments)    'just not myself, was told not to take metformin'  . Morphine Swelling  . Nubain (Nalbuphine Hcl) Swelling  . Tobramycin   . Topiramate (Topamax) Swelling    Tongue swelling - per echart records  . Toradol (Ketorolac Tromethamine) Swelling    Patient Measurements: Height: 5' 10.47" (179 cm) Weight: 240 lb 15.4 oz (109.3 kg) IBW/kg (Calculated) : 69.59   Vital Signs: Temp: 98.7 F (37.1 C) (10/10 1454) Temp src: Oral (10/10 1454) BP: 96/70 mmHg (10/10 1454) Pulse Rate: 112  (10/10 1454) Intake/Output from previous day:   Intake/Output from this shift:    Labs: Estimated Creatinine Clearance: 22.6 ml/min (by C-G formula based on Cr of 4.37).  Microbiology: No results found for this or any previous visit (from the past 720 hour(s)).  Medical History: Past Medical History  Diagnosis Date  . DVT (deep venous thrombosis)     DVT HISTORY  . Diabetes mellitus   . Migraine     HISTORY  . Anxiety and depression   . CAD (coronary artery disease)     Last cath 2009:  LAD stent 80% stenosis. Circumflex 60-70% stenosis AV groove, right coronary artery 50-60% stenosis. She did have cutting balloon angioplasty of the LAD lesion into a diagonal. However, there was restenosis of this and it was managed medically.  . Bipolar 1 disorder   . Hyperlipidemia   . Hyperparathyroidism   . PE (pulmonary embolism)     HISTORY  . Dialysis patient   . Anemia   . Renal failure     dialysis [place] t,th sat  . Nephrotic syndrome   . Peripheral vascular disease   . MRSA infection   . Anxiety   . Depression   . Gastroparesis   . Complication of  anesthesia   . PONV (postoperative nausea and vomiting)     x3 days post anesth. on 12/19/2011  . Myocardial infarction     x3 , last one - 10 yrs. ago  . Hypertension     sees Dr. Antoine Poche - [place], last stress test 11/2011, see result in EPIC, saw Dr. Antoine Poche as well at that time    . Shortness of breath   . Recurrent upper respiratory infection (URI)     Bronchitis 11/2011 - saw Dr. Loney Hering, treating currently /w antibiotic   . GERD (gastroesophageal reflux disease)   . Pneumonia     APH- 2012  . Noncompliance of patient with renal dialysis    Assessment: 41yof on hemodialysis TTS to begin vancomycin for LLE cellulitis. Per patient's family member she only tolerated about a half session of dialysis today before coming to the ED. Has not been seen by renal yet so unsure if she will complete the rest of her session tonight vs waiting until her next scheduled session on Saturday. Therefore will give a one time loading dose of vancomycin and follow up renal plans for further maintenance dosing  Goal of Therapy:  Vancomycin pre-HD level 15-25  Plan:  1) Vancomycin 2000mg  IV x 1 2) Follow up renal plans   Fredrik Rigger 06/05/2012,5:11 PM  Now adding Zosyn for LLE cellulitis. Will use HD dosing.  Plan: 1) Zosyn 2.25g IV q8  Fredrik Rigger 06/05/2012, 9:15 PM

## 2012-06-05 NOTE — ED Notes (Signed)
Patient states rash on buttocks, patient also states cellulitis in left leg, patient was supposed to receive antibiotics at dialysis today but came instead

## 2012-06-06 ENCOUNTER — Encounter: Payer: Self-pay | Admitting: Neurosurgery

## 2012-06-06 LAB — GLUCOSE, CAPILLARY
Glucose-Capillary: 123 mg/dL — ABNORMAL HIGH (ref 70–99)
Glucose-Capillary: 195 mg/dL — ABNORMAL HIGH (ref 70–99)
Glucose-Capillary: 195 mg/dL — ABNORMAL HIGH (ref 70–99)
Glucose-Capillary: 96 mg/dL (ref 70–99)

## 2012-06-06 LAB — MRSA PCR SCREENING: MRSA by PCR: POSITIVE — AB

## 2012-06-06 MED ORDER — OXYCODONE HCL 5 MG PO TABS
5.0000 mg | ORAL_TABLET | ORAL | Status: DC | PRN
  Administered 2012-06-06 – 2012-06-08 (×8): 5 mg via ORAL
  Filled 2012-06-06 (×8): qty 1

## 2012-06-06 MED ORDER — BUTALBITAL-APAP-CAFFEINE 50-325-40 MG PO TABS
1.0000 | ORAL_TABLET | Freq: Three times a day (TID) | ORAL | Status: DC | PRN
  Administered 2012-06-06 – 2012-06-08 (×5): 1 via ORAL
  Filled 2012-06-06 (×5): qty 1

## 2012-06-06 MED ORDER — NEPRO/CARBSTEADY PO LIQD
237.0000 mL | Freq: Two times a day (BID) | ORAL | Status: DC
  Administered 2012-06-06 – 2012-06-08 (×3): 237 mL via ORAL
  Filled 2012-06-06 (×10): qty 237

## 2012-06-06 MED ORDER — SEVELAMER CARBONATE 800 MG PO TABS
4000.0000 mg | ORAL_TABLET | Freq: Three times a day (TID) | ORAL | Status: DC
  Administered 2012-06-06 – 2012-06-08 (×4): 4000 mg via ORAL
  Filled 2012-06-06 (×10): qty 5

## 2012-06-06 MED ORDER — DARBEPOETIN ALFA-POLYSORBATE 25 MCG/0.42ML IJ SOLN: 25.0000 ug | INTRAMUSCULAR | Status: DC

## 2012-06-06 MED ORDER — SEVELAMER CARBONATE 800 MG PO TABS
1600.0000 mg | ORAL_TABLET | Freq: Two times a day (BID) | ORAL | Status: DC | PRN
  Filled 2012-06-06: qty 2

## 2012-06-06 MED ORDER — OXYCODONE-ACETAMINOPHEN 5-325 MG PO TABS
1.0000 | ORAL_TABLET | ORAL | Status: DC | PRN
  Administered 2012-06-06 – 2012-06-08 (×7): 1 via ORAL
  Filled 2012-06-06 (×7): qty 1

## 2012-06-06 MED ORDER — VANCOMYCIN HCL 1000 MG IV SOLR
1250.0000 mg | Freq: Once | INTRAVENOUS | Status: AC
  Administered 2012-06-07: 1250 mg via INTRAVENOUS
  Filled 2012-06-06: qty 1250

## 2012-06-06 MED ORDER — MUPIROCIN 2 % EX OINT
1.0000 "application " | TOPICAL_OINTMENT | Freq: Two times a day (BID) | CUTANEOUS | Status: DC
  Administered 2012-06-06 – 2012-06-08 (×5): 1 via NASAL
  Filled 2012-06-06 (×2): qty 22

## 2012-06-06 MED ORDER — VANCOMYCIN HCL IN DEXTROSE 1-5 GM/200ML-% IV SOLN: 1000.0000 mg | INTRAVENOUS | Status: DC

## 2012-06-06 MED ORDER — CHLORHEXIDINE GLUCONATE CLOTH 2 % EX PADS
6.0000 | MEDICATED_PAD | Freq: Every day | CUTANEOUS | Status: DC
  Administered 2012-06-06 – 2012-06-08 (×3): 6 via TOPICAL

## 2012-06-06 NOTE — Progress Notes (Signed)
Family Medicine Teaching Service Daily Progress Note Intern Pager: [phone]  Patient name: [name] Medical record number: [id number] Date of birth: 1971/06/24 Age: 41 y.o. Gender: female  Primary Care Provider: Ernestine Conrad, MD  Subjective: Pt seen at bedside. Complains of some pain in back and leg. Leg redness/swelling/pain is subjectively described as improved. Some "stomach pain from gastroparesis, which is normal for me" (pt's exact words), little appetite and some nausea which is better after Phenergan.  Per nursing: Pt refused scheduled meds and AM lab draws early this morning/overnight. Complaining of pain; clarified PRN orders. Pt also initially refused to wear O2 [place] last night. Reports she does not use CPAP at home.  Objective: Temp:  [97.9 F (36.6 C)-98.7 F (37.1 C)] 97.9 F (36.6 C) (10/11 0812) Pulse Rate:  [80-112] 94  (10/11 0812) Resp:  [5-19] 9  (10/11 0812) BP: (96-158)/(62-89) 138/70 mmHg (10/11 0812) SpO2:  [80 %-100 %] 100 % (10/11 0812) Weight:  [240 lb 15.4 oz (109.3 kg)] 240 lb 15.4 oz (109.3 kg) (10/10 1500) Exam: General: obese adult female lying in bed, in NAD but somewhat disinterested; on 4L [place] currently Cardiovascular: normal S1, S2, no rub/murmur appreciated; heart sounds distant secondary to body habitus  Respiratory: difficult exam due to body habitus and cooperation but CTAB, scant expiratory wheezes bilaterally  Abdomen: soft, nontender, obese, large healed midline scar from previous surgery with ~2 cm soft easily reducible paraincisional hernia  Extremities:   marked bilateral 2+ pitting edema from feet to knees, remains  left LE with circumferential redness and warmth from ankle to just below knee; less warm/red this AM  area of redness remains very tender to palpation   distal pulses non-palpable (audible on doppler bilaterally, on admission; not re-assessed with doppler this AM) Skin: little change from admission exam; skin not completely  reassessed secondary to cooperation  RLE with two areas of chronic slow-healing ulcerations, one to just below knee and one above ankle   Bilateral poorly-healing ulcerations to groin at previous bypass graft incisional sites, right worse than left   multiple shallow 0.5 cm circular ulcerations to neck/shoulders, upper chest, and bilateral buttocks,   some ulcerations with scabbing, surrounding erythema, very tender   buttock lesions in particular are erythematous and more tender  Neuro: alert, oriented, answers questions appropriately  Laboratory: Pt refused AM lab draw.  Imaging/Diagnostic Tests: CXR, 10/10 @1750   Findings:  Grossly unchanged enlarged cardiac silhouette and mediastinal  contours. Stable position of support apparatus. Improved aeration  of the bilateral lungs with persistent perihilar heterogeneous  opacities. Mild pulmonary venous congestion without frank evidence  of edema. There is chronic mild elevation of the right  hemidiaphragm. Chronic blunting of the right costophrenic angle  without definite pleural effusion. No pneumothorax. Unchanged  bones.  IMPRESSION:  Chronic pulmonary venous congestion without definite acute  cardiopulmonary disease.   Foot X-ray, LEFT, 10/10 @1750   Findings:  Diffuse osteopenia. No definite fracture. Joint spaces are  preserved. No definite erosions. Pes cavus deformity suspected on  this nonweightbearing radiograph. Minimal enthesopathic change of  the Achilles tendon insertion site. There is mild diffuse soft  tissue swelling about the foot.  IMPRESSION:  Mild diffuse soft tissue swelling about the foot without associate  fracture or dislocation.  Assessment and Plan:  NAINIKA NEWLUN is a 41 y.o. year old female with extensive PMH significant for multiple vascular procedures, ESRD on HD, CAD/PAD with previous MI s/p stenting, and DM with chronic neuropathy and poor  wound healing presenting with left lower leg  redness/swelling/pain for 1-2 weeks diagnosed as probable cellulitis by NP at [facility]) on 10/8. Decision by pt at that time to remain outpt with IV vanc at HD visits, but was unable to tolerate HD 10/10 and did not receive first dose.   1. Cellulitis/sepsis - LLE, likely also component of chronic venous stasis. Circumferential from ankle nearly to knee. Vitals and source of infection meet criteria for sepsis. Responsive to IVF so not severe sepsis. Appearance of lesion improving on IV abx. Labs refused this AM. Plan - Vancomycin per pharmacy, added Zosyn for Pseudomonas coverage. Will f/u blood cultures. No evidence for deeper/more extensive infection at this time.   2. ESRD on HD - On TThS schedule in [place], [place]. Baseline Cr appears to be ~3.5-5. Hx of multiple graft/catheter placements. Most recently/currently has right IJ catheter. Tolerated only ~2 hours of HD today due to pain. Weight currently up ~20 kg from reported dry weight. Fluid-overloaded on exam at time of admission, little UOP response to one-time dose of Lasix IV. Labs refused this AM. Plan - Continue home renal medications. Saline lock IV. Will follow up renal recommendations.  3. DM type II with multiple complications - Pt on Lantus 33 units qHS, Humalog SSI TID at home, with Reglan for gastroparesis. Last A1c 6.9 in July. Multiple chronic poorly-healing wounds: right LE ulcers, groin incisions from previous bypass surgery, multiple shallow skin ulcerations to neck/shoulders, back, and buttocks. Blood sugars reasonably controlled on home regimen, for now. Plan - Continue home Lantus, Humalog SSI, and Reglan. Formal wound care consult requested this morning.   4. HTN - Slight hypotension at presentation, 96/70. Baseline appears 120-140's systolic. Improved in the ED s/p gentle hydration, at baseline as of 10/11. Monitor with vitals.   5. CAD/PAD with history of MI - S/p stent placement in 2003; cath in 2009 showed some occlusion of  stent with angioplasty and subsequent medically managed restenosis. Aortobifemoral graft and above-the-knee femoral-popliteal bypass on the right leg in May 2013. Echo in 01/2012 showed EF of 60-65% with normal systolic function and mild LVH.  Plan - Continue home Plavix. [facility] (Dr. Myra Gianotti) aware of admission.  6. Bipolar disorder/anxiety/chronic pain - Diagnoses/meds per pt history and problem list in Epic. Continue home Paxil, Abilify. Xanax PRN. Percocet 10-325 PRN.  7. GERD - Protonix to replace home omeprazole.   8. Hx of migraines - Fioricet PRN.   9. Seasonal allergies - Continue home Flonase.   10. Hx multiple eye surgeries - Continue home eyedrops (Polysporin, ofloxacin).   FEN/GI: Carb-modified diet, Nepro supplements. IVF @[facility]  Contact Precautions for MRSA  Prophylaxis/PRN: Lovenox, renal-adjusted dose. Phenergan.  Disposition: Pending clinical improvement. CSW and CM consulted 10/11 for exploration of home/placement needs. Code Status: Full  [name], Cristal Deer, MD 06/06/2012, 9:29 AM

## 2012-06-06 NOTE — Progress Notes (Signed)
4:32 AM 06/06/2012  Patient refuses AM lab draw. Importance of checking lab values explained to patient. Alternative lab draw time suggested to patient. Patient still refuses. MD made aware. Will continue to monitor and update as needed.  Sherle Poe RN

## 2012-06-06 NOTE — Progress Notes (Signed)
Multiple wounds 2-3 cm rounds on buttocks; and 4-5 cm round noted to rt. Lower leg.

## 2012-06-06 NOTE — H&P (Signed)
FMTS Attending Admission Note: Renold Don MD Personal pager:  [phone] FPTS Service Pager:  [phone]  I  have seen and examined this patient, reviewed their chart. I have discussed this patient with the resident. I agree with the resident's findings, assessment and care plan.  Briefly, 41 yo F with history of CAD/PAD, ESRD on HD T-TH-Sat, DM 1 who has been battling left lower extremity redness and swelling for the past week.  Saw her vascular surgeon on Tuesday, recommended for admission but she declined and wanted to do Vancomycin during HD.  Was unable to tolerate full HD yesterday on Thursday due to nausea and therefore did not get full Vanc dose.  Was sent to ED for rest of Vancomycin.  In ED, evaluated and thought she needed to be admitted for LE cellulitis.  No fevers.  Does have some nausea.  Also with increased WBC to 21.    PE: GEN:  Adult female lying in bed in NAD HEENT:  [place]/AT, MMM Cardiac:  Regular rate and rhythm.  Good S1/S2. Lungs:  CTAB Ext:  +2 edema BL LE's.  LLE with erythema from just inferior to knee to dorsal aspect of her foot.  Some pain upon palpation but patient can be distracted from pain, no pain out of proportion to exam. Neuro:  Sensation 5/5 and strength 5/5 and symmetric BL LE's   Imp/Plan: 1.  LLE cellulits: - Agree with admission. - Agree with vanc/zosyn as broad coverage in this diabetic female with poor wound healing.   - Elevated WBC + tachycardia + source = sepsis.  Agree with admission to step down unit.  - Blood cultures pending.  Do not think there is evidence of deeper soft tissue infection/abscess, but if lack of improvement low threshold to obtain imaging.   2.  HD: - Agree with renal consult today to see if she needs further HD as yesterday's session was cut short.  Agree with resident note for all other chronic medical issues.

## 2012-06-06 NOTE — Consult Note (Signed)
WOC consult Note Reason for Consult: eval wounds of chest?  And she reports wound of groin.  Wound type: chest wound, unclear etiology, she does have scarring over her arms and chest, circular. She does not report these to be pruritic, states they just pop up. Would need dermatology follow up to determine etiology of these. She treats them successfully at home with triple antibiotic ointment.   She also has healed wound of the right lateral malleolar region. She has cellulites of the left lower extremity but no open wounds.  She has slightly open groin wound in the left non healing from vascular surgery  Measurement:upper chest-0.5cm x 0.5cmx0.2cm, left groin: 0.5cm x 1.0cm x 0.2cm  Wound OZH:YQMV are pink and moist, with no drainage Drainage (amount, consistency, odor) no odor, none Periwound:intact  Dressing procedure/placement/frequency: will order triple antibiotic ointment for the chest cover with bandaid or dry gauze, hydrogel to the right groin, cover with dry dressing.  Change both daily.  Re consult if needed, will not follow at this time. Thanks  Conard Alvira [employer], CWOCN [phone])

## 2012-06-06 NOTE — Progress Notes (Signed)
6:52 AM 06/06/2012  Patient placed on orange contact precautions for a hx of MRSA positive MRSA PCRr dated 06/06/2012 and multiple wounds. MD made aware, pt informed of the need for contact precautions. Infectious disease consult made. Will continue to monitor and update as needed.   Sherle Poe RN

## 2012-06-06 NOTE — ED Notes (Signed)
Patient refused a foley catheter that was ordered, preferred to get up to the beside commode.

## 2012-06-06 NOTE — Consult Note (Signed)
[name] is an 41 y.o. female referred by Dr Gwendolyn Grant   Chief Complaint: ESRD, Sec HPTH,. anemia HPI: 41yo WF with ESRD on HD in [place] on tthsat schedule admitted yest for cellulitis of Lt lower ext.  BFR 350, time 4hr , Rt perm cath, 2K bath, DW 80kg  Past Medical History  Diagnosis Date  . DVT (deep venous thrombosis)     DVT HISTORY  . Diabetes mellitus   . Migraine     HISTORY  . Anxiety and depression   . CAD (coronary artery disease)     Last cath 2009:  LAD stent 80% stenosis. Circumflex 60-70% stenosis AV groove, right coronary artery 50-60% stenosis. She did have cutting balloon angioplasty of the LAD lesion into a diagonal. However, there was restenosis of this and it was managed medically.  . Bipolar 1 disorder   . Hyperlipidemia   . Hyperparathyroidism   . PE (pulmonary embolism)     HISTORY  . Dialysis patient   . Anemia   . Renal failure     dialysis [place] t,th sat  . Nephrotic syndrome   . Peripheral vascular disease   . MRSA infection   . Anxiety   . Depression   . Gastroparesis   . Complication of anesthesia   . PONV (postoperative nausea and vomiting)     x3 days post anesth. on 12/19/2011  . Myocardial infarction     x3 , last one - 10 yrs. ago  . Hypertension     sees Dr. Antoine Poche - [place], last stress test 11/2011, see result in EPIC, saw Dr. Antoine Poche as well at that time    . Shortness of breath   . Recurrent upper respiratory infection (URI)     Bronchitis 11/2011 - saw Dr. Loney Hering, treating currently /w antibiotic   . GERD (gastroesophageal reflux disease)   . Pneumonia     [facility]- 2012  . Noncompliance of patient with renal dialysis     Past Surgical History  Procedure Date  . Incise and drain abcess     OF THIGHS FROM INSULIN INJECTIONS  . Heart stents   . Portacath placement 2003  . Cataract extraction w/phaco 11/09/2011    Procedure: CATARACT EXTRACTION PHACO AND INTRAOCULAR LENS PLACEMENT (IOC);  Surgeon: Edmon Crape, MD;  Location:  [facility] OR;  Service: Ophthalmology;  Laterality: Right;  . Pars plana vitrectomy 11/09/2011    Procedure: PARS PLANA VITRECTOMY WITH 23 GAUGE;  Surgeon: Edmon Crape, MD;  Location: [facility] OR;  Service: Ophthalmology;  Laterality: Right;  Ahmed Valve; Scleral Reinforcement Graft  . Dg av dialysis  shunt access exist*l* or 12/13/11    left arm  . Dialysis cath inserted & portacath      dialysis catheter inserted & portacath d/c'd- 12/19/2011  . Back surgery X 4    x4 times   . Cholecystectomy   . Eye surgery   . Cardiac catheterization   . Aorta - bilateral femoral artery bypass graft 12/28/2011    Procedure: AORTA BIFEMORAL BYPASS GRAFT;  Surgeon: Nada Libman, MD;  Location: [facility] OR;  Service: Vascular;  Laterality: N/A;  AortoBifemoral Bypass using hemasheild 14x7 graft  . Femoral-popliteal bypass graft 12/28/2011    Procedure: BYPASS GRAFT FEMORAL-POPLITEAL ARTERY;  Surgeon: Nada Libman, MD;  Location: [facility] OR;  Service: Vascular;  Laterality: Right;  right femoral popliteal bypass using 6x80 propaten graft  . Insertion of dialysis catheter 02/15/2012    Procedure: INSERTION OF DIALYSIS  CATHETER;  Surgeon: Chuck Hint, MD;  Location: [facility] OR;  Service: Vascular;  Laterality: Right;  insertion of dialysis catheter right internal jugular vein,removal of left internal jugular vein dialysis catheter  . Insertion of dialysis catheter 02/21/2012    Procedure: INSERTION OF DIALYSIS CATHETER;  Surgeon: Chuck Hint, MD;  Location[facility] OR;  Service: Cardiovascular;  Laterality: N/A;  Insertion new dialysis catheter; possible venoplasty of fibrin sheath of  Right IJ catheter  . Venogram 02/21/2012    Procedure: VENOGRAM;  Surgeon: Chuck Hint, MD;  Location[facility] OR;  Service: Cardiovascular;  Laterality: N/A;  possible venoplasty  . Insertion of dialysis catheter 04/23/2012    Procedure: INSERTION OF DIALYSIS CATHETER;  Surgeon: Sherren Kerns, MD;  Location: [facility] OR;  Service: Vascular;   Laterality: Right;  using a 69fr. x 23 cannon catheter in Right Internal Jugular    Family History  Problem Relation Age of Onset  . Transient ischemic attack Mother   . Hepatitis Sister   . Diabetes type II Other   . Anesthesia problems Neg Hx   . Hypotension Neg Hx   . Malignant hyperthermia Neg Hx   . Pseudochol deficiency Neg Hx    Social History:  reports that she has been smoking Cigarettes.  She has a 60 pack-year smoking history. She has never used smokeless tobacco. She reports that she does not drink alcohol or use illicit drugs.  Allergies:  Allergies  Allergen Reactions  . Compazine (Prochlorperazine) Swelling    Per echart records (takes promethazine at home)  . Contrast Media (Iodinated Diagnostic Agents) Swelling    Per echart records  . Meperidine Hcl Swelling  . Metformin And Related Other (See Comments)    'just not myself, was told not to take metformin'  . Morphine Swelling  . Nubain (Nalbuphine Hcl) Swelling  . Tobramycin   . Topiramate (Topamax) Swelling    Tongue swelling - per echart records  . Toradol (Ketorolac Tromethamine) Swelling    Medications Prior to Admission  Medication Sig Dispense Refill  . albuterol (PROVENTIL HFA;VENTOLIN HFA) 108 (90 BASE) MCG/ACT inhaler Inhale 2 puffs into the lungs every 6 (six) hours as needed. Shortness of breath      . ALPRAZolam (XANAX) 1 MG tablet Take 1 tablet (1 mg total) by mouth 3 (three) times daily as needed. For anxiety  60 tablet  0  . ARIPiprazole (ABILIFY) 2 MG tablet Take 10 mg by mouth daily.       Marland Kitchen b complex-vitamin c-folic acid (NEPHRO-VITE) 0.8 MG TABS Take 0.8 mg by mouth at bedtime.      . bacitracin ophthalmic ointment Place 1 application into both eyes at bedtime. apply to eye      . butalbital-acetaminophen-caffeine (FIORICET, ESGIC) 50-325-40 MG per tablet Take 1 tablet by mouth 3 (three) times daily after meals. For headaches  45 tablet  0  . cinacalcet (SENSIPAR) 30 MG tablet Take 30 mg  by mouth daily with supper.       . clopidogrel (PLAVIX) 75 MG tablet Take 75 mg by mouth daily. Last dose 12/17/2011      . collagenase (SANTYL) ointment Apply 1 application topically daily.      . darbepoetin (ARANESP) 60 MCG/0.3ML SOLN Inject 60 mcg into the vein every Saturday with hemodialysis.      . fluticasone (FLONASE) 50 MCG/ACT nasal spray Place 1 spray into the nose.       . furosemide (LASIX) 40 MG tablet Take  40 mg by mouth daily.       . insulin glargine (LANTUS) 100 UNIT/ML injection Inject 33 Units into the skin at bedtime.      . insulin lispro (HUMALOG) 100 UNIT/ML injection Inject 7-15 Units into the skin 4 (four) times daily -  before meals and at bedtime. TAKING ON SLIDING SCALE 200-249=7 units 250-299=10 units 300-349=12 units >350=15 units      . methocarbamol (ROBAXIN) 500 MG tablet Take 500 mg by mouth 2 (two) times daily.       . metoCLOPramide (REGLAN) 10 MG tablet Take 10 mg by mouth 4 (four) times daily.      Marland Kitchen ofloxacin (OCUFLOX) 0.3 % ophthalmic solution Place 1 drop into both eyes 3 (three) times daily.       Marland Kitchen omeprazole (PRILOSEC) 40 MG capsule Take 40 mg by mouth daily.       Marland Kitchen oxyCODONE-acetaminophen (PERCOCET) 10-325 MG per tablet Take 1 tablet by mouth every 4 (four) hours as needed. For pain      . PARoxetine (PAXIL) 40 MG tablet Take 80 mg by mouth every morning.       . promethazine (PHENERGAN) 25 MG tablet Take 25 mg by mouth every 6 (six) hours as needed. For nausea      . pyridoxine (B-6) 100 MG tablet Take 100 mg by mouth daily.      . sevelamer (RENVELA) 800 MG tablet Take 1,600-4,000 mg by mouth 5 (five) times daily. *Take 5 tablets daily with each meal and take 2 tablets with snacks**         Lab Results: UA: NA  Basename 06/05/12 1657  WBC 21.9*  HGB 9.1*  HCT 29.0*  PLT 296   BMET  Basename 06/05/12 1657  NA 134*  K 4.0  CL 94*  CO2 24  GLUCOSE 202*  BUN 20  CREATININE 3.39*  CALCIUM 9.1  PHOS --   LFT No results found  for this basename: PROT,ALBUMIN,AST,ALT,ALKPHOS,BILITOT,BILIDIR,IBILI in the last 72 hours Dg Chest 2 View  06/05/2012  *RADIOLOGY REPORT*  Clinical Data: Pain, bilateral lower extremity cellulitis  CHEST - 2 VIEW  Comparison: 05/24/2012; 04/24/2012; 04/23/2012  Findings:  Grossly unchanged enlarged cardiac silhouette and mediastinal contours.  Stable position of support apparatus.  Improved aeration of the bilateral lungs with persistent perihilar heterogeneous opacities.  Mild pulmonary venous congestion without frank evidence of edema.  There is chronic mild elevation of the right hemidiaphragm.  Chronic blunting of the right costophrenic angle without definite pleural effusion.  No pneumothorax.  Unchanged bones.  IMPRESSION: Chronic pulmonary venous congestion without definite acute cardiopulmonary disease.   Original Report Authenticated By: Waynard Reeds, M.D.    Dg Foot Complete Left  06/05/2012  *RADIOLOGY REPORT*  Clinical Data: Left foot pain  LEFT FOOT - COMPLETE 3+ VIEW  Comparison: None.  Findings:  Diffuse osteopenia.  No definite fracture.  Joint spaces are preserved.  No definite erosions.  Pes cavus deformity suspected on this nonweightbearing radiograph.  Minimal enthesopathic change of the Achilles tendon insertion site.  There is mild diffuse soft tissue swelling about the foot.  IMPRESSION: Mild diffuse soft tissue swelling about the foot without associate fracture or dislocation.   Original Report Authenticated By: Waynard Reeds, M.D.     ROS:  Appetitte good No sob No CP No abd pain No dysuria Pain, swelling and redness of Lt lower ext No change in vision  PHYSICAL EXAM: Blood pressure 141/69, pulse 90,  temperature 97.4 F (36.3 C), temperature source Oral, resp. rate 22, height 5' 10.47" (1.79 m), weight 109.3 kg (240 lb 15.4 oz), last menstrual period 09/03/2008, SpO2 99.00%. HEENT: PERRLA EOMI NECK:Rt IJ permcath LUNGS:Clear CARDIAC:RRR WO MRG ABD:+ BS NTND no  HSM ZOX:WRUEAVW venous changes bil.  Lt lower leg erythematous, 2+edema and tender to touch NEURO:CNI ox3 no asterixis  Assessment: 1. Lt lower ext cellulitis 2. Sec HPTH 3. ESRD 4. DM 5. Anemia PLAN: 1. Plan HD in AM 2. Renal Vitamin 3. Cont renagel though this is a large dose and I asked her to discuss a different binder with her primary nephrologist 4. Resume ESA    Laurel Harnden T 06/06/2012, 12:36 PM

## 2012-06-06 NOTE — Progress Notes (Signed)
I discussed with  Dr Street.  I agree with their plans documented in their progress note for today.  

## 2012-06-06 NOTE — Progress Notes (Signed)
4:30 AM  06/06/2012   CRITICAL VALUE ALERT  Critical value received:  MRSA postive  Date of notification:  06/06/2012  Time of notification:  4:31 AM  Critical value read back:yes  Nurse who received alert:  Sherle Poe  MD notified (1st page):  . Dr. Joneen Roach   Time of first page:  4:31 AM  MD notified (2nd page):  Time of second page:  Responding MD:  Dr. Joneen Roach  Time MD responded:  4:32 AM

## 2012-06-06 NOTE — Progress Notes (Signed)
Pt has CNA 26 hr/wk through [facility], [facility] currently provides PT/OT services.  Pt has w/c, rolling walker, shower chair, 3-N-1, mother states she needs tub bench, replacement 3-N-1, and air overlay mattress.  TC to [facility] CM, Danelle Earthly [phone]), left VM message requesting return call.  Sister reports pt smokes 3 packs of cigarettes daily, sleeps sitting up in recliner, has frequent periods of apnea.  Pt initially stated she did not want sleep study because she would not wear a mask, but later agreed to reconsider.

## 2012-06-06 NOTE — Progress Notes (Signed)
[facility] admitted from [facility]. [facility] MD notified of patient arrival. Pt desats and goes apneic while sleeping. Patient refuses to wear oxygen. Pt refuses scheduled meds. Will continue to monitor and update as needed. Carlyon Prows [facility] 06/06/2012 2:10 AM

## 2012-06-06 NOTE — Progress Notes (Signed)
Clinical Social Work Department BRIEF PSYCHOSOCIAL ASSESSMENT 06/06/2012  Patient:  [name], [name]     Account Number:  [id number]     Admit date:  06/05/2012  Clinical Social Worker:  Dennison Bulla  Date/Time:  06/06/2012 03:45 PM  Referred by:  Physician  Date Referred:  06/06/2012 Referred for  Psychosocial assessment   Other Referral:   Interview type:  Patient Other interview type:   Mom and sister involved    PSYCHOSOCIAL DATA Living Status:  FAMILY Admitted from facility:   Level of care:   Primary support name:  Mary Primary support relationship to patient:  PARENT Degree of support available:   Strong    CURRENT CONCERNS Current Concerns  Post-Acute Placement  Other - See comment   Other Concerns:   Follow through with appointments    SOCIAL WORK ASSESSMENT / PLAN CSW received referral from MD reporting that patient is not following through with HD appointments and possible placement needs. CSW reviewed chart and met with patient, sister and mom at bedside. Patient agreeable to visitor involvement during assessment.    CSW introduced myself and explained role. Patient lives with mom and sister. Patient has CAPP services that assist her and has HH through [facility]. Patient goes to HD T, TH, S. Patient's CAPP worker takes her to HD on T and TH and mom and sister picks up patient and takes patient to HD on Saturdays. CSW spoke with mom and sister regarding follow through with appointments. Mom reports that she and sister will assist with ensuring patient attends all appointments. Patient is unable to drive at this time and relies on family for transportation. Mom reports she has reliable transportation and will assist with transportation. PT note states that patient could benefit from SNF. CSW spoke with family and patient regarding SNF. SNF is not desired at this time. Family and patient request HH services again to assist with therapy but refuse SNF at this  time.    Patient and family report no further needs at this time. CSW is signing off but available if needed.   Assessment/plan status:  No Further Intervention Required Other assessment/ plan:   Information/referral to community resources:   SNF information    PATIENT'S/FAMILY'S RESPONSE TO PLAN OF CARE: Patient alert and agreeable to family involvement. Mom engaged throughout assessment and appreciative of CSW consult but reports all needs are being cared for at this time.

## 2012-06-06 NOTE — Evaluation (Addendum)
Physical Therapy Evaluation Patient Details Name: [name] MRN: [id number] DOB: 11-23-1970 Today's Date: 06/06/2012 Time: 5784-6962 PT Time Calculation (min): 31 min  PT Assessment / Plan / Recommendation Clinical Impression  Pt admitted with sepsis and LLE cellulitis. Pt familiar from prior hospitalizations and has 24hr care at home however lengthy discussion with caregivers in presence of pt stating how they have to perform all ADLs for pt and that mother has been enabling pt for limited activity and smoking 3packs/day. Both mother and sister  are physically worn out by caring for pt and sister expressed desire for SNF for pt to regain strength and function prior to home to decrease burden of care prior to eventual return home, however mother has not yet stated that she doesn't want to care for pt at home. Pt has had progressive decline in function over the last year based on medical issues with revascularization, cellulitis and breathing issues in addition to lack of participation in HD and P.T. at times. Pt will benefit from acute therapy if willing to fully participate in advancing transfers and mobility to increase independence and decrease burden of care.     PT Assessment  Patient needs continued PT services    Follow Up Recommendations  Home health PT;Supervision for mobility/OOB;Post acute inpatient    Does the patient have the potential to tolerate intense rehabilitation   No, Recommend SNF  Barriers to Discharge Other (comment) (caregivers currently 24hrs but deciding if able to continue)      Equipment Recommendations  Tub/shower bench;Other (comment) (air overlay for hospital bed- pt with sacral wounds)    Recommendations for Other Services     Frequency Min 2X/week    Precautions / Restrictions Precautions Precautions: Fall   Pertinent Vitals/Pain Pt reports chronic back and buttock pain grossly 5/10      Mobility  Bed Mobility Bed Mobility: Rolling  Right;Right Sidelying to Sit;Sit to Sidelying Right Rolling Right: 5: Supervision;With rail Right Sidelying to Sit: 4: Min assist;HOB elevated;With rails (HOB 20degrees) Sit to Sidelying Right: 4: Min assist;HOB flat Details for Bed Mobility Assistance: assist to elevate feet onto surface of bed and assist to elevate trunk from surface for sitting. Pt sat 5 min EOB before requesting return to sidelying. Pt refused further activity Transfers Transfers: Not assessed Ambulation/Gait Ambulation/Gait Assistance: Not tested (comment)    Shoulder Instructions     Exercises     PT Diagnosis: Generalized weakness  PT Problem List: Decreased strength;Decreased activity tolerance;Decreased balance;Decreased mobility;Decreased coordination;Pain PT Treatment Interventions: DME instruction;Functional mobility training;Therapeutic activities;Therapeutic exercise;Patient/family education;Gait training   PT Goals Acute Rehab PT Goals PT Goal Formulation: With patient/family Time For Goal Achievement: 06/20/12 Potential to Achieve Goals: Fair Pt will go Supine/Side to Sit: with supervision;with HOB 0 degrees PT Goal: Supine/Side to Sit - Progress: Goal set today Pt will go Sit to Supine/Side: with supervision;with HOB 0 degrees PT Goal: Sit to Supine/Side - Progress: Goal set today Pt will go Sit to Stand: with min assist PT Goal: Sit to Stand - Progress: Goal set today Pt will go Stand to Sit: with min assist PT Goal: Stand to Sit - Progress: Goal set today Pt will Transfer Bed to Chair/Chair to Bed: with min assist PT Transfer Goal: Bed to Chair/Chair to Bed - Progress: Goal set today  Visit Information  Last PT Received On: 06/06/12 Assistance Needed: +1 (+2 OOB)    Subjective Data  Subjective: "I have to lay back down" Patient Stated Goal: "walk again"  Prior Functioning  Home Living Lives With: Family (mother and sister) Available Help at Discharge: Family;Available 24  hours/day;Personal care attendant (aide 26hrs/wk) Type of Home: House Home Access: Ramped entrance Home Layout: One level Bathroom Shower/Tub: Engineer, manufacturing systems: Standard Home Adaptive Equipment: Hospital bed;Walker - rolling;Wheelchair - Careers adviser (comment);Bedside commode/3-in-1;Shower chair with back (lift chair) Additional Comments: Family has to lift pt legs over tub for her to step into tub to sit on seat for bathing. Pt has not been sleeping in hospital bed due to mattress too firm and uses gel cushion and sleeps in lift chair Prior Function Level of Independence: Needs assistance Needs Assistance: Light Housekeeping;Meal Prep;Transfers;Gait;Dressing;Bathing;Toileting Bath: Maximal Dressing: Maximal Toileting: Maximal Meal Prep: Total Light Housekeeping: Total Gait Assistance: mod assist with RW limited distances grossly 15' but not ambulating for the last few weeks Transfer Assistance: min assist into tub, pt can transfer from lift chair to [facility] on her own, assist for transfer from [facility] grossly min assist Able to Take Stairs?: No Driving: No Vocation: On disability Communication Communication: No difficulties    Cognition  Overall Cognitive Status: Impaired Area of Impairment: Safety/judgement Arousal/Alertness: Lethargic Orientation Level: Disoriented to;Time Behavior During Session: Flat affect Safety/Judgement: Decreased awareness of need for assistance Cognition - Other Comments: Pt demonstrates decreased participation and realization of work involved to truly increase function and return to amb    Extremity/Trunk Assessment Right Lower Extremity Assessment RLE ROM/Strength/Tone: Unable to fully assess;Deficits (due to lack of participation) RLE ROM/Strength/Tone Deficits: grossly 2/5 Left Lower Extremity Assessment LLE ROM/Strength/Tone: Unable to fully assess;Deficits LLE ROM/Strength/Tone Deficits: grossly 2/5   Balance Static Sitting Balance Static  Sitting - Balance Support: Bilateral upper extremity supported;Feet supported Static Sitting - Level of Assistance: 6: Modified independent (Device/Increase time) Static Sitting - Comment/# of Minutes: 5  End of Session PT - End of Session Activity Tolerance: Patient limited by fatigue;Patient limited by pain;Other (comment) (decreased participation) Patient left: in bed;with call bell/phone within reach;with family/visitor present  GP Functional Assessment Tool Used: clinical judgement Functional Limitation: Mobility: Walking and moving around Mobility: Walking and Moving Around Current Status ([id number]): At least 20 percent but less than 40 percent impaired, limited or restricted Mobility: Walking and Moving Around Goal Status [phone]): At least 1 percent but less than 20 percent impaired, limited or restricted   Delorse Lek 06/06/2012, 3:07 PM  Delaney Meigs, PT [phone]

## 2012-06-07 ENCOUNTER — Observation Stay (HOSPITAL_COMMUNITY): Payer: Medicare Other

## 2012-06-07 DIAGNOSIS — M7989 Other specified soft tissue disorders: Secondary | ICD-10-CM

## 2012-06-07 DIAGNOSIS — I739 Peripheral vascular disease, unspecified: Secondary | ICD-10-CM

## 2012-06-07 LAB — CBC
MCV: 99.3 fL (ref 78.0–100.0)
Platelets: 356 10*3/uL (ref 150–400)
RDW: 20.2 % — ABNORMAL HIGH (ref 11.5–15.5)
WBC: 16.9 10*3/uL — ABNORMAL HIGH (ref 4.0–10.5)

## 2012-06-07 LAB — COMPREHENSIVE METABOLIC PANEL
AST: 8 U/L (ref 0–37)
Albumin: 2 g/dL — ABNORMAL LOW (ref 3.5–5.2)
Chloride: 95 mEq/L — ABNORMAL LOW (ref 96–112)
Creatinine, Ser: 5.28 mg/dL — ABNORMAL HIGH (ref 0.50–1.10)
Total Bilirubin: <0.1 mg/dL — ABNORMAL LOW (ref 0.3–1.2)

## 2012-06-07 LAB — GLUCOSE, CAPILLARY
Glucose-Capillary: 190 mg/dL — ABNORMAL HIGH (ref 70–99)
Glucose-Capillary: 177 mg/dL — ABNORMAL HIGH (ref 70–99)

## 2012-06-07 MED ORDER — DARBEPOETIN ALFA-POLYSORBATE 60 MCG/0.3ML IJ SOLN
INTRAMUSCULAR | Status: AC
  Administered 2012-06-07: 60 ug via INTRAVENOUS
  Filled 2012-06-07: qty 0.3

## 2012-06-07 NOTE — Consult Note (Signed)
I agree with the above. The patient has been seen and examined. She has cellulitis of her left lower extremity. She also has significant edema in the left leg. She has a known left superficial femoral artery occlusion. She is status post aortobifemoral bypass grafting and right femoral-popliteal bypass. Prior to proceeding with further revascularization of the left leg, I would attempt to treat her cellulitis medically. This will require leg elevation to help minimize the edema, and IV antibiotics to help with a cellulitis. She would also benefit from a compression stocking as she would be willing to where this. We will continue to follow her while she is in the hospital.  [name]

## 2012-06-07 NOTE — Progress Notes (Signed)
Family Medicine Teaching Service Daily Progress Note  Pager: [phone]  Patient name: [name] Medical record number: [id number] Date of birth: 03-Apr-1971 Age: 41 y.o. Gender: female  Primary Care Provider: Ernestine Conrad, MD  Subjective: Patient sleeping in HD, complains of leg pain.   Objective: Temp:  [97.3 F (36.3 C)-98.5 F (36.9 C)] 98.2 F (36.8 C) (10/12 0355) Pulse Rate:  [82-95] 82  (10/11 1600) Resp:  [18-22] 18  (10/11 1600) BP: (135-141)/(67-69) 135/67 mmHg (10/11 1600) SpO2:  [98 %-100 %] 98 % (10/11 1600) Weight:  [196 lb 3.4 oz (89 kg)] 196 lb 3.4 oz (89 kg) (10/12 1478) Exam: General: obese adult female lying in bed, in NAD, sleeping Cardiovascular: normal S1, S2, no rub/murmur appreciated; heart sounds distant secondary to body habitus  Respiratory: lungs clear, decreased breath sounds in the bases, no crackles.  Abdomen: soft, nontender, obese Extremities:   1+ edema  left LE with circumferential redness and warmth from ankle to just below knee; less warm/red this AM  area of redness remains very tender to palpation   distal pulses non-palpable (audible on doppler bilaterally, on admission; not re-assessed with doppler this AM) Skin: little change from admission exam; skin not completely reassessed secondary to cooperation  RLE with two areas of chronic slow-healing ulcerations, one to just below knee and one above ankle   multiple shallow 0.5 cm circular ulcerations to neck/shoulders, upper chest, and bilateral buttocks, some ulcerations with scabbing, surrounding erythema, very tender    Neuro: Drowsy, but no focal deficits.   Laboratory: Lab Results  Component Value Date   WBC 16.9* 06/07/2012   HGB 9.2* 06/07/2012   HCT 30.2* 06/07/2012   MCV 99.3 06/07/2012   PLT 356 06/07/2012   Lab Results  Component Value Date   CREATININE 5.28* 06/07/2012   BUN 37* 06/07/2012   NA 135 06/07/2012   K 4.0 06/07/2012   CL 95* 06/07/2012   CO2 23  06/07/2012     Imaging/Diagnostic Tests: CXR, 10/10 @1750   Findings:  Grossly unchanged enlarged cardiac silhouette and mediastinal  contours. Stable position of support apparatus. Improved aeration  of the bilateral lungs with persistent perihilar heterogeneous  opacities. Mild pulmonary venous congestion without frank evidence  of edema. There is chronic mild elevation of the right  hemidiaphragm. Chronic blunting of the right costophrenic angle  without definite pleural effusion. No pneumothorax. Unchanged  bones.  IMPRESSION:  Chronic pulmonary venous congestion without definite acute  cardiopulmonary disease.   Foot X-ray, LEFT, 10/10 @1750   Findings:  Diffuse osteopenia. No definite fracture. Joint spaces are  preserved. No definite erosions. Pes cavus deformity suspected on  this nonweightbearing radiograph. Minimal enthesopathic change of  the Achilles tendon insertion site. There is mild diffuse soft  tissue swelling about the foot.  IMPRESSION:  Mild diffuse soft tissue swelling about the foot without associate  fracture or dislocation.  Assessment and Plan:  [name] is a 41 y.o. year old female with extensive PMH significant for multiple vascular procedures, ESRD on HD, CAD/PAD with previous MI s/p stenting, and DM with chronic neuropathy and poor wound healing presenting with left lower leg redness/swelling/pain consistent with cellulitis, and at admission patient met sepsis criteria:   1. Cellulitis/sepsis - LLE, likely also component of chronic venous stasis. Circumferential from ankle nearly to knee. Admission vitals and source of infection meet criteria for sepsis. Responsive to IVF, and now hemodynamically stable. Appearance of lesion improving on IV abx. Labs refused this AM.  Plan - Vancomycin per pharmacy, added Zosyn for Pseudomonas coverage. Will f/u blood cultures. No evidence for deeper/more extensive infection at this time.  WBC improved today.     2. ESRD on HD - On TThS schedule in [place], [place]. Baseline Cr appears to be ~3.5-5. Weight currently up ~20 kg from reported dry weight. Fluid-overloaded on exam at time of admission, little UOP response to one-time dose of Lasix IV Plan - Continue home renal medications. Saline lock IV. Electrolytes stable this am, she is tolerating HD.   3. DM type II with multiple complications - Pt on Lantus 33 units qHS, Humalog SSI TID at home, with Reglan for gastroparesis. Last A1c 6.9 in July. Multiple chronic poorly-healing wounds: right LE ulcers, groin incisions from previous bypass surgery, multiple shallow skin ulcerations to neck/shoulders, back, and buttocks. Blood sugars reasonably controlled on home regimen, for now. Plan - Continue home Lantus, Humalog SSI, and Reglan. Formal wound care consult requested this morning.   4. HTN - Well controlled.  5. CAD/PAD with history of MI - S/p stent placement in 2003; cath in 2009 showed some occlusion of stent with angioplasty and subsequent medically managed restenosis. Aortobifemoral graft and above-the-knee femoral-popliteal bypass on the right leg in May 2013. Echo in 01/2012 showed EF of 60-65% with normal systolic function and mild LVH.  Plan - Continue home Plavix. VVS (Dr. Myra Gianotti) aware of admission.  6. Bipolar disorder/anxiety/chronic pain - Diagnoses/meds per pt history and problem list in Epic. Continue home Paxil, Abilify. Xanax PRN. Percocet 10-325 PRN.  7. GERD - Protonix to replace home omeprazole.   8. Hx of migraines - Fioricet PRN.   9. Seasonal allergies - Continue home Flonase.   10. Hx multiple eye surgeries - Continue home eyedrops (Polysporin, ofloxacin).   FEN/GI: Carb-modified diet, Nepro supplements. IVF @[facility]  Contact Precautions for MRSA  Prophylaxis/PRN: Lovenox, renal-adjusted dose. Phenergan.  Disposition: Pending clinical improvement. CSW and CM consulted 10/11 for exploration of home/placement needs. Code Status:  Full  [name] Buerger, MD 06/07/2012, 8:16 AM

## 2012-06-07 NOTE — Progress Notes (Signed)
I discussed with Dr Lula Olszewski.  I agree with their plans documented in their progrss note for today.

## 2012-06-07 NOTE — Consult Note (Signed)
[facility] [place] CONSULT NOTE 06/07/2012 DOB: [id number] MRN : [id number]  NG:EXBM leg swelling cellulitis.   History of Present Illness: This is a patient of Dr. Myra Gianotti is status post aortobifemoral bypass graft and right femoral to above-the-knee popliteal artery bypass graft [employer] in may of 2013. The patient was last seen by Dr. Myra Gianotti 05/19/2012. The patient does need a day for the last 2-3 weeks her left leg has been swelling and red and. She has not had any injury and reports no problem prior to the onset of this difficulty. The patient called the office asking for evaluation and was brought in for venous duplex to rule out DVT.  The duplex was negative for DVT.  She was admitted for antibiotics for cellulitis.     Past Medical History  Diagnosis Date  . DVT (deep venous thrombosis)     DVT HISTORY  . Diabetes mellitus   . Migraine     HISTORY  . Anxiety and depression   . CAD (coronary artery disease)     Last cath 2009:  LAD stent 80% stenosis. Circumflex 60-70% stenosis AV groove, right coronary artery 50-60% stenosis. She did have cutting balloon angioplasty of the LAD lesion into a diagonal. However, there was restenosis of this and it was managed medically.  . Bipolar 1 disorder   . Hyperlipidemia   . Hyperparathyroidism   . PE (pulmonary embolism)     HISTORY  . Dialysis patient   . Anemia   . Renal failure     dialysis [place] t,th sat  . Nephrotic syndrome   . Peripheral vascular disease   . MRSA infection   . Anxiety   . Depression   . Gastroparesis   . Complication of anesthesia   . PONV (postoperative nausea and vomiting)     x3 days post anesth. on 12/19/2011  . Myocardial infarction     x3 , last one - 10 yrs. ago  . Hypertension     sees Dr. Antoine Poche - [place], last stress test 11/2011, see result in EPIC, saw Dr. Antoine Poche as well at that time    . Shortness of breath   . Recurrent upper respiratory infection (URI)     Bronchitis  11/2011 - saw Dr. Loney Hering, treating currently /w antibiotic   . GERD (gastroesophageal reflux disease)   . Pneumonia     [facility]- 2012  . Noncompliance of patient with renal dialysis     Past Surgical History  Procedure Date  . Incise and drain abcess     OF THIGHS FROM INSULIN INJECTIONS  . Heart stents   . Portacath placement 2003  . Cataract extraction w/phaco 11/09/2011    Procedure: CATARACT EXTRACTION PHACO AND INTRAOCULAR LENS PLACEMENT (IOC);  Surgeon: Edmon Crape, MD;  Location: [facility] OR;  Service: Ophthalmology;  Laterality: Right;  . Pars plana vitrectomy 11/09/2011    Procedure: PARS PLANA VITRECTOMY WITH 23 GAUGE;  Surgeon: Edmon Crape, MD;  Location: [facility] OR;  Service: Ophthalmology;  Laterality: Right;  Ahmed Valve; Scleral Reinforcement Graft  . Dg av dialysis  shunt access exist*l* or 12/13/11    left arm  . Dialysis cath inserted & portacath      dialysis catheter inserted & portacath d/c'd- 12/19/2011  . Back surgery X 4    x4 times   . Cholecystectomy   . Eye surgery   . Cardiac catheterization   . Aorta - bilateral femoral artery bypass graft 12/28/2011  Procedure: AORTA BIFEMORAL BYPASS GRAFT;  Surgeon: Nada Libman, MD;  Location: [facility] OR;  Service: Vascular;  Laterality: N/A;  AortoBifemoral Bypass using hemasheild 14x7 graft  . Femoral-popliteal bypass graft 12/28/2011    Procedure: BYPASS GRAFT FEMORAL-POPLITEAL ARTERY;  Surgeon: Nada Libman, MD;  Location: [facility] OR;  Service: Vascular;  Laterality: Right;  right femoral popliteal bypass using 6x80 propaten graft  . Insertion of dialysis catheter 02/15/2012    Procedure: INSERTION OF DIALYSIS CATHETER;  Surgeon: Chuck Hint, MD;  Location: [facility] OR;  Service: Vascular;  Laterality: Right;  insertion of dialysis catheter right internal jugular vein,removal of left internal jugular vein dialysis catheter  . Insertion of dialysis catheter 02/21/2012    Procedure: INSERTION OF DIALYSIS CATHETER;  Surgeon: Chuck Hint, MD;  Location: [facility] OR;  Service: Cardiovascular;  Laterality: N/A;  Insertion new dialysis catheter; possible venoplasty of fibrin sheath of  Right IJ catheter  . Venogram 02/21/2012    Procedure: VENOGRAM;  Surgeon: Chuck Hint, MD;  Location: [facility] OR;  Service: Cardiovascular;  Laterality: N/A;  possible venoplasty  . Insertion of dialysis catheter 04/23/2012    Procedure: INSERTION OF DIALYSIS CATHETER;  Surgeon: Sherren Kerns, MD;  Location: [facility] OR;  Service: Vascular;  Laterality: Right;  using a 30fr. x 23 cannon catheter in Right Internal Jugular     ROS: [x]  Positive  [ ]  Denies    General: [ ]  Weight loss, [ ]  Fever, [ ]  chills Neurologic: [ ]  Dizziness, [ ]  Blackouts, [ ]  Seizure [ ]  Stroke, [ ]  "Mini stroke", [ ]  Slurred speech, [ ]  Temporary blindness; [ ]  weakness in arms or legs, [ ]  Hoarseness Cardiac: [ ]  Chest pain/pressure, [ ]  Shortness of breath at rest [ ]  Shortness of breath with exertion, [ ]  Atrial fibrillation or irregular heartbeat Vascular: [ ]  Pain in legs with walking, [ ]  Pain in legs at rest, [ ]  Pain in legs at night,  [ ]  Non-healing ulcer, [ ]  Blood clot in vein/DVT,   Pulmonary: [ ]  Home oxygen, [ ]  Productive cough, [ ]  Coughing up blood, [ ]  Asthma,  [ ]  Wheezing Musculoskeletal:  [ ]  Arthritis, [ ]  Low back pain, [ ]  Joint pain Hematologic: [x ] Easy Bruising, [ ]  Anemia; [ ]  Hepatitis Gastrointestinal: [ ]  Blood in stool, [ ]  Gastroesophageal Reflux/heartburn, [ ]  Trouble swallowing Urinary: [ ]  chronic Kidney disease, [x ] on HD - [ ]  MWF or [ ]  TTHS, [ ]  Burning with urination, [ ]  Difficulty urinating Skin: [ ]  Rashes, [ ]  Wounds Psychological: [ ]  Anxiety, [ ]  Depression  Social History History  Substance Use Topics  . Smoking status: Current Every Day Smoker -- 3.0 packs/day for 20 years    Types: Cigarettes  . Smokeless tobacco: Never Used   Comment: pt states she is not ready to quit   . Alcohol Use: No    Family  History Family History  Problem Relation Age of Onset  . Transient ischemic attack Mother   . Hepatitis Sister   . Diabetes type II Other   . Anesthesia problems Neg Hx   . Hypotension Neg Hx   . Malignant hyperthermia Neg Hx   . Pseudochol deficiency Neg Hx     Allergies  Allergen Reactions  . Compazine (Prochlorperazine) Swelling    Per echart records (takes promethazine at home)  . Contrast Media (Iodinated Diagnostic Agents) Swelling    Per echart records  .  Meperidine Hcl Swelling  . Metformin And Related Other (See Comments)    'just not myself, was told not to take metformin'  . Morphine Swelling  . Nubain (Nalbuphine Hcl) Swelling  . Tobramycin   . Topiramate (Topamax) Swelling    Tongue swelling - per echart records  . Toradol (Ketorolac Tromethamine) Swelling    Current Facility-Administered Medications  Medication Dose Route Frequency Provider Last Rate Last Dose  . 0.9 %  sodium chloride infusion  250 mL Intravenous PRN Josalyn Funches, MD 10 mL/hr at 06/06/12 0200 250 mL at 06/06/12 0200  . albuterol (PROVENTIL HFA;VENTOLIN HFA) 108 (90 BASE) MCG/ACT inhaler 2 puff  2 puff Inhalation Q6H PRN Josalyn Funches, MD      . ALPRAZolam Prudy Feeler) tablet 1 mg  1 mg Oral TID PRN Dessa Phi, MD   1 mg at 06/07/12 0755  . ARIPiprazole (ABILIFY) tablet 10 mg  10 mg Oral Daily Josalyn Funches, MD   10 mg at 06/06/12 1000  . bacitracin-polymyxin b (POLYSPORIN) ophthalmic ointment   Both Eyes QHS Dessa Phi, MD      . butalbital-acetaminophen-caffeine (FIORICET, ESGIC) 50-325-40 MG per tablet 1 tablet  1 tablet Oral TID PRN Bobbye Morton, MD   1 tablet at 06/06/12 2305  . Chlorhexidine Gluconate Cloth 2 % PADS 6 each  6 each Topical Q0600 Tobey Grim, MD   6 each at 06/07/12 [phone]  . cinacalcet (SENSIPAR) tablet 30 mg  30 mg Oral Q supper Josalyn Funches, MD      . clopidogrel (PLAVIX) tablet 75 mg  75 mg Oral Daily Josalyn Funches, MD   75 mg at 06/07/12 0755   . darbepoetin (ARANESP) injection 60 mcg  60 mcg Intravenous Q Sat-HD Josalyn Funches, MD      . enoxaparin (LOVENOX) injection 30 mg  30 mg Subcutaneous Q24H Josalyn Funches, MD   30 mg at 06/07/12 0755  . feeding supplement (NEPRO CARB STEADY) liquid 237 mL  237 mL Oral BID BM [address], MD   237 mL at 06/06/12 1910  . fluticasone (FLONASE) 50 MCG/ACT nasal spray 1 spray  1 spray Each Nare Daily Josalyn Funches, MD      . insulin aspart (novoLOG) injection 7-15 Units  7-15 Units Subcutaneous TID AC & HS Josalyn Funches, MD      . insulin glargine (LANTUS) injection 33 Units  33 Units Subcutaneous QHS Josalyn Funches, MD      . methocarbamol (ROBAXIN) tablet 500 mg  500 mg Oral BID Dessa Phi, MD   500 mg at 06/06/12 2255  . metoCLOPramide (REGLAN) tablet 10 mg  10 mg Oral QID Dessa Phi, MD   10 mg at 06/06/12 2255  . multivitamin (RENA-VIT) tablet 1 tablet  1 tablet Oral Daily Dessa Phi, MD   1 tablet at 06/06/12 1042  . mupirocin ointment (BACTROBAN) 2 % 1 application  1 application Nasal BID Tobey Grim, MD   1 application at 06/06/12 2255  . nicotine (NICODERM CQ - dosed in mg/24 hours) patch 21 mg  21 mg Transdermal Daily Josalyn Funches, MD   21 mg at 06/06/12 1042  . ofloxacin (OCUFLOX) 0.3 % ophthalmic solution 1 drop  1 drop Both Eyes TID Dessa Phi, MD   1 drop at 06/06/12 2255  . oxyCODONE-acetaminophen (PERCOCET/ROXICET) 5-325 MG per tablet 1 tablet  1 tablet Oral Q4H PRN Tobey Grim, MD   1 tablet at 06/07/12 0116   And  . oxyCODONE (Oxy  IR/ROXICODONE) immediate release tablet 5 mg  5 mg Oral Q4H PRN Tobey Grim, MD   5 mg at 06/07/12 0755  . pantoprazole (PROTONIX) EC tablet 40 mg  40 mg Oral Q1200 Josalyn Funches, MD   40 mg at 06/06/12 1316  . PARoxetine (PAXIL) tablet 80 mg  80 mg Oral Daily Josalyn Funches, MD   80 mg at 06/06/12 1042  . piperacillin-tazobactam (ZOSYN) IVPB 2.25 g  2.25 g Intravenous [address] [place], PHARMD    2.25 g at 06/07/12 0615  . promethazine (PHENERGAN) tablet 25 mg  25 mg Oral Q6H PRN Dessa Phi, MD   25 mg at 06/07/12 0116  . pyridOXINE (VITAMIN B-6) tablet 100 mg  100 mg Oral Daily Josalyn Funches, MD   100 mg at 06/06/12 1045  . sevelamer (RENVELA) tablet 1,600 mg  1,600 mg Oral BID PRN Tobey Grim, MD      . sevelamer (RENVELA) tablet 4,000 mg  4,000 mg Oral TID WC Tobey Grim, MD   4,000 mg at 06/06/12 1043  . vancomycin (VANCOCIN) 1,250 mg in sodium chloride 0.9 % 250 mL IVPB  1,250 mg Intravenous Once [employer], PHARMD      . vancomycin (VANCOCIN) IVPB 1000 mg/200 mL premix  1,000 mg Intravenous Q T,Th,Sa-HD Crystal Salomon Fick, PHARMD      . DISCONTD: butalbital-acetaminophen-caffeine (FIORICET, ESGIC) 50-325-40 MG per tablet 1 tablet  1 tablet Oral TID PC Josalyn Funches, MD      . DISCONTD: darbepoetin (ARANESP) injection 25 mcg  25 mcg Intravenous Q Sat-HD Dyke Maes, MD         Imaging: Dg Chest 2 View  06/05/2012  *RADIOLOGY REPORT*  Clinical Data: Pain, bilateral lower extremity cellulitis  CHEST - 2 VIEW  Comparison: 05/24/2012; 04/24/2012; 04/23/2012  Findings:  Grossly unchanged enlarged cardiac silhouette and mediastinal contours.  Stable position of support apparatus.  Improved aeration of the bilateral lungs with persistent perihilar heterogeneous opacities.  Mild pulmonary venous congestion without frank evidence of edema.  There is chronic mild elevation of the right hemidiaphragm.  Chronic blunting of the right costophrenic angle without definite pleural effusion.  No pneumothorax.  Unchanged bones.  IMPRESSION: Chronic pulmonary venous congestion without definite acute cardiopulmonary disease.   Original Report Authenticated By: Waynard Reeds, M.D.    Dg Foot Complete Left  06/05/2012  *RADIOLOGY REPORT*  Clinical Data: Left foot pain  LEFT FOOT - COMPLETE 3+ VIEW  Comparison: None.  Findings:  Diffuse osteopenia.   No definite fracture.  Joint spaces are preserved.  No definite erosions.  Pes cavus deformity suspected on this nonweightbearing radiograph.  Minimal enthesopathic change of the Achilles tendon insertion site.  There is mild diffuse soft tissue swelling about the foot.  IMPRESSION: Mild diffuse soft tissue swelling about the foot without associate fracture or dislocation.   Original Report Authenticated By: Waynard Reeds, M.D.     Significant Diagnostic Studies: CBC Lab Results  Component Value Date   WBC 16.9* 06/07/2012   HGB 9.2* 06/07/2012   HCT 30.2* 06/07/2012   MCV 99.3 06/07/2012   PLT 356 06/07/2012    BMET    Component Value Date/Time   NA 134* 06/05/2012 1657   K 4.0 06/05/2012 1657   CL 94* 06/05/2012 1657   CO2 24 06/05/2012 1657   GLUCOSE 202* 06/05/2012 1657   BUN 20 06/05/2012 1657   CREATININE 3.39* 06/05/2012 1657   CREATININE 1.78* 10/17/2008  2000   CALCIUM 9.1 06/05/2012 1657   CALCIUM 8.3* 08/21/2011 0533   GFRNONAA 16* 06/05/2012 1657   GFRAA 18* 06/05/2012 1657    COAG Lab Results  Component Value Date   INR 1.11 04/22/2012   INR 1.25 03/07/2012   INR 1.07 02/21/2012   No results found for this basename: PTT     Physical Examination BP Readings from Last 3 Encounters:  06/07/12 149/70  06/03/12 127/68  05/26/12 120/70   Temp Readings from Last 3 Encounters:  06/07/12 98.2 F (36.8 C) Oral  05/26/12 97.6 F (36.4 C)   05/24/12 97.9 F (36.6 C) Oral   SpO2 Readings from Last 3 Encounters:  06/07/12 98%  05/24/12 99%  05/19/12 100%   Pulse Readings from Last 3 Encounters:  06/07/12 98  06/03/12 99  05/26/12 76    General:  WDWN in NAD Gait: Normal HENT: WNL Eyes: Pupils equal Pulmonary: normal non-labored breathing , without Rales, rhonchi,  wheezing Cardiac: RRR, without  Murmurs, rubs or gallops; No carotid bruits Abdomen: soft, NT, no masses Skin: no rashes, ulcers noted Vascular Exam/Pulses:Non palpable pulses positive  edema left leg knee to foot.  No open wounds on the left leg.  Extremities on the right LE The ulcer below the knee has completely healed. The ulcer at the level of the ankle as only a small residual eschar remaining, less than 1 cm. Left leg from the knee down with swelling and erythema secondary to cellulitis..   Musculoskeletal: no muscle wasting or atrophy  Neurologic: A&O X 3; Appropriate Affect ;  SENSATION: grossly intact.  Non-Invasive Vascular Imaging:Left lower extremity duplex shows no evidence of DVT, there is a 74 .4 x 2.5 cm fluid collection visualized the proximal thigh.   ASSESSMENT/PLAN:Left lower extremity redness and swelling, probable cellulitis. I spoke with Dr. Myra Gianotti who recommended the patient come to the hospital for evaluation and possible IV vancomycin therapy.  IV antibiotics, Compression hose to left leg only , elevation.

## 2012-06-07 NOTE — Progress Notes (Signed)
S: CO sores on backside O:BP 135/67  Pulse 82  Temp 98.2 F (36.8 C) (Oral)  Resp 18  Ht 5' 10.47" (1.79 m)  Wt 109.3 kg (240 lb 15.4 oz)  BMI 34.11 kg/m2  SpO2 98%  LMP 09/03/2008 No intake or output data in the 24 hours ending 06/07/12 0638 Weight change:  RUE:AVWUJ and alert CVS:RRR Resp:clear Abd:+ BS NTND Ext:lt lower ext a little less swollen and less erythematous NEURO: CNI Ox3 no asterixis SKIN:  Several superficial wounds on buttocks      . ARIPiprazole  10 mg Oral Daily  . bacitracin-polymyxin b   Both Eyes QHS  . Chlorhexidine Gluconate Cloth  6 each Topical Q0600  . cinacalcet  30 mg Oral Q supper  . clopidogrel  75 mg Oral Daily  . darbepoetin  60 mcg Intravenous Q Sat-HD  . enoxaparin  30 mg Subcutaneous Q24H  . feeding supplement (NEPRO CARB STEADY)  237 mL Oral BID BM  . fluticasone  1 spray Each Nare Daily  . insulin aspart  7-15 Units Subcutaneous TID AC & HS  . insulin glargine  33 Units Subcutaneous QHS  . methocarbamol  500 mg Oral BID  . metoCLOPramide  10 mg Oral QID  . multivitamin  1 tablet Oral Daily  . mupirocin ointment  1 application Nasal BID  . nicotine  21 mg Transdermal Daily  . ofloxacin  1 drop Both Eyes TID  . pantoprazole  40 mg Oral Q1200  . PARoxetine  80 mg Oral Daily  . piperacillin-tazobactam (ZOSYN)  IV  2.25 g Intravenous Q8H  . pyridoxine  100 mg Oral Daily  . sevelamer  4,000 mg Oral TID WC  . vancomycin  1,250 mg Intravenous Once  . vancomycin  1,000 mg Intravenous Q T,Th,Sa-HD  . DISCONTD: butalbital-acetaminophen-caffeine  1 tablet Oral TID PC  . DISCONTD: darbepoetin (ARANESP) injection - DIALYSIS  25 mcg Intravenous Q Sat-HD   Dg Chest 2 View  06/05/2012  *RADIOLOGY REPORT*  Clinical Data: Pain, bilateral lower extremity cellulitis  CHEST - 2 VIEW  Comparison: 05/24/2012; 04/24/2012; 04/23/2012  Findings:  Grossly unchanged enlarged cardiac silhouette and mediastinal contours.  Stable position of support  apparatus.  Improved aeration of the bilateral lungs with persistent perihilar heterogeneous opacities.  Mild pulmonary venous congestion without frank evidence of edema.  There is chronic mild elevation of the right hemidiaphragm.  Chronic blunting of the right costophrenic angle without definite pleural effusion.  No pneumothorax.  Unchanged bones.  IMPRESSION: Chronic pulmonary venous congestion without definite acute cardiopulmonary disease.   Original Report Authenticated By: Waynard Reeds, M.D.    Dg Foot Complete Left  06/05/2012  *RADIOLOGY REPORT*  Clinical Data: Left foot pain  LEFT FOOT - COMPLETE 3+ VIEW  Comparison: None.  Findings:  Diffuse osteopenia.  No definite fracture.  Joint spaces are preserved.  No definite erosions.  Pes cavus deformity suspected on this nonweightbearing radiograph.  Minimal enthesopathic change of the Achilles tendon insertion site.  There is mild diffuse soft tissue swelling about the foot.  IMPRESSION: Mild diffuse soft tissue swelling about the foot without associate fracture or dislocation.   Original Report Authenticated By: Waynard Reeds, M.D.    BMET    Component Value Date/Time   NA 134* 06/05/2012 1657   K 4.0 06/05/2012 1657   CL 94* 06/05/2012 1657   CO2 24 06/05/2012 1657   GLUCOSE 202* 06/05/2012 1657   BUN 20 06/05/2012 1657  CREATININE 3.39* 06/05/2012 1657   CREATININE 1.78* 10/17/2008 2000   CALCIUM 9.1 06/05/2012 1657   CALCIUM 8.3* 08/21/2011 0533   GFRNONAA 16* 06/05/2012 1657   GFRAA 18* 06/05/2012 1657   CBC    Component Value Date/Time   WBC 21.9* 06/05/2012 1657   RBC 2.93* 06/05/2012 1657   HGB 9.1* 06/05/2012 1657   HCT 29.0* 06/05/2012 1657   PLT 296 06/05/2012 1657   MCV 99.0 06/05/2012 1657   MCH 31.1 06/05/2012 1657   MCHC 31.4 06/05/2012 1657   RDW 20.8* 06/05/2012 1657   LYMPHSABS 0.7 06/05/2012 1657   MONOABS 1.5* 06/05/2012 1657   EOSABS 0.1 06/05/2012 1657   BASOSABS 0.1 06/05/2012 1657      Assessment: 1. Lt leg cellulitis 2. Sec HPTH, on sensipar 3.ESRD 4. HTN 5. DM  Plan: 1. HD today 2 Cont antibiotics  [name] T

## 2012-06-08 LAB — GLUCOSE, CAPILLARY: Glucose-Capillary: 86 mg/dL (ref 70–99)

## 2012-06-08 LAB — HEPATITIS B SURFACE ANTIGEN: Hepatitis B Surface Ag: NEGATIVE

## 2012-06-08 MED ORDER — AMOXICILLIN-POT CLAVULANATE 400-57 MG/5ML PO SUSR: 400.0000 mg | Freq: Every day | ORAL | Status: DC

## 2012-06-08 MED ORDER — VANCOMYCIN HCL IN DEXTROSE 1-5 GM/200ML-% IV SOLN: 1000.0000 mg | INTRAVENOUS | Status: DC

## 2012-06-08 NOTE — Progress Notes (Signed)
Pt discharged to home in care of mother and sister. Taken via wheelchair to car. Discharge instructions given that included prescriptions and follow-up appointments. Mother (POA) and patient state understanding of discharge instructions that is verbalized through teach-back method. Vital signs stable. No distress.

## 2012-06-08 NOTE — Progress Notes (Signed)
I have seen and examined this patient. I have discussed with Dr Oh Park.  I agree with their findings and plans as documented in their progress note.    

## 2012-06-08 NOTE — Progress Notes (Signed)
Subjective: No new events. Patient reports improvement in pain in her left leg.   Objective: Vital signs in last 24 hours: Temp:  [97.3 F (36.3 C)-99.2 F (37.3 C)] 97.3 F (36.3 C) (10/13 0800) Pulse Rate:  [88-106] 90  (10/13 0800) Resp:  [8-21] 11  (10/13 0404) BP: (138-161)/(59-79) 149/79 mmHg (10/13 0800) SpO2:  [94 %-100 %] 97 % (10/13 0800) Weight:  [190 lb 11.2 oz (86.5 kg)-191 lb 5.8 oz (86.8 kg)] 191 lb 5.8 oz (86.8 kg) (10/13 1610)  Physical exam: General: NAD; obese CV: RRR, normal S1/S2, decreased heart sounds secondary to body habitus RESP: NI WOB; CTAB without crackles or wheezes ABD: soft, NT, obese EXT: LOWER EXTREMITY   RIGHT: no erythema, warmth, edema; two areas of chronic slow-healing ulcerations, one to just below knee and one above ankle   LEFT: persistent erythema from knee to ankle, however, no appreciable warmth or edema; mild-moderate TTP; distal pulses non-palpable (audible on doppler bilaterally, on admission; not re-assessed with doppler this AM)  NEURO: alert and oriented; appropriate to questions; non-drowsy  Results for orders placed during the hospital encounter of 06/05/12 (from the past 24 hour(s))  GLUCOSE, CAPILLARY     Status: Abnormal   Collection Time   06/07/12  1:32 PM      Component Value Range   Glucose-Capillary 110 (*) 70 - 99 mg/dL  GLUCOSE, CAPILLARY     Status: Abnormal   Collection Time   06/07/12  6:04 PM      Component Value Range   Glucose-Capillary 190 (*) 70 - 99 mg/dL  GLUCOSE, CAPILLARY     Status: Abnormal   Collection Time   06/07/12  9:32 PM      Component Value Range   Glucose-Capillary 177 (*) 70 - 99 mg/dL   Comment 1 Document in Chart     Comment 2 Notify RN    GLUCOSE, CAPILLARY     Status: Normal   Collection Time   06/08/12  8:31 AM      Component Value Range   Glucose-Capillary 86  70 - 99 mg/dL  GLUCOSE, CAPILLARY     Status: Abnormal   Collection Time   06/08/12 11:46 AM      Component Value  Range   Glucose-Capillary 161 (*) 70 - 99 mg/dL    Studies/Results: No new.  Medications: reviewed. She has received about 40 mg of oxycodone past 24 hours.  Assessment/Plan: [name] is a 41 y.o. year old female with extensive PMH significant for multiple vascular procedures, ESRD on HD, CAD/PAD with previous MI s/p stenting, and DM with chronic neuropathy and poor wound healing presenting with left lower leg redness/swelling/pain consistent with cellulitis, and at admission patient met sepsis criteria.   ID Cellulitis/sepsis - LLE, likely also component of chronic venous stasis. Circumferential from ankle nearly to knee. 10/10 admission vitals and source of infection met sepsis criteria but she has been hemodynamically stable over past couple of days and remains.  Cellulitis improving with vancomycin and Zosyn.  Blood cultures show NGTD.  -Continue vancomycin with hemodialysis. Will change Zosyn to Augmentin per Renal recommendations.   RENAL ESRD on HD - On TThS schedule in [place], [place]. Baseline Cr appears to be ~3.5-5.  Weight currently up ~20 kg from reported dry weight on admission, 240 #. 196 # on 10/12 and 191 # today. She does not appear fluid overloaded.  -Continue home renal medications -Appreciate renal consultation  ENDO DM type II with multiple complications -  Pt on Lantus 33 units qHS, Humalog SSI TID at home, with Reglan for gastroparesis. Last A1c 6.9 in July. Multiple chronic poorly-healing wounds: right LE ulcers, groin incisions from previous bypass surgery, multiple shallow skin ulcerations to neck/shoulders, back, and buttocks. Blood sugars controlled -Continue home Lantus, Humalog SSI, and Reglan.  CV HTN - Fair control. Starting to become more elevated SBP 140-160s. -Holding home Lasix. Will re-start at time of discharge.  CAD/PAD with history of MI - S/p stent placement in 2003; cath in 2009 showed some occlusion of stent with angioplasty and subsequent  medically managed restenosis. Aortobifemoral graft and above-the-knee femoral-popliteal bypass on the right leg in May 2013. Echo in 01/2012 showed EF of 60-65% with normal systolic function and mild LVH.  -Continue home Plavix. -Compression hose  -[facility] (Dr. Myra Gianotti) consulting. Consider revascularization after treating cellulitis.   RESP Desaturations to 70s overnight 10/10 She was placed on CPAP at that time but has not required since.  -Will defer to PCP for outpatient sleep study since patient may be amenable to wearing CPAP mask if diagnosed.   PSYCH Bipolar disorder/anxiety/chronic pain - Diagnoses/meds per pt history and problem list in Epic.  -Continue home Paxil, Abilify. Xanax PRN. Percocet 10-325 PRN.   NEURO Hx of migraines -Fioricet PRN.   FEN/GI  -Carb-modified diet -IVF: KVO  PPx -DVT PPx: Lovenox, renal-adjusted -History of GERD: Protonix  Disposition: will discharge to home with home health. I spoke with her sister who will be caring for her and her sister as well as patient are amenable to discharge. Follow-up issues: -Resolution of cellulitis. Please make sure she is taking Augmentin through 10/23 and is receiving vancomycin at dialysis, first dose on 10/15 through 10/22. -PCP may consider sleep study due to concern for OSA -Consulting nephrologist has asked patient to discuss with primary nephrologist phosphate binder since she appears to be on large dose  CODE: full    LOS: 3 days   OH [name], [name] Rosengren

## 2012-06-08 NOTE — Discharge Summary (Signed)
Physician Discharge Summary  Patient ID: [name] MRN: [id number] DOB/AGE: 1971-04-03 41 y.o.  Admit date: 06/05/2012 Discharge date: 06/08/2012  Admission Diagnoses: Left leg cellulitis with sepsis   Discharge Diagnoses:  Principal Problem:  *Sepsis Active Problems:  CAD, NATIVE VESSEL  Diabetic gastroparesis  ESRD on dialysis  DM (diabetes mellitus), type 2 with complications  PVD (peripheral vascular disease)  Tobacco abuse  Bipolar 1 disorder  Left leg cellulitis   Discharged Condition: stable  Hospital Course: [name] is a 41 y.o. year old female with extensive PMH significant for multiple vascular procedures, ESRD on HD, CAD/PAD with previous MI s/p stenting, and DM with chronic neuropathy and poor wound healing presenting with left lower leg redness/swelling/pain consistent with cellulitis, and at admission patient met sepsis criteria.   ID  Cellulitis/sepsis - LLE, likely also component of chronic venous stasis. Circumferential from ankle nearly to knee. 10/10 admission vitals and source of infection met sepsis criteria but she has been hemodynamically stable over past couple of days and remains. Cellulitis improvedwith vancomycin and Zosyn. Blood cultures show NGTD. She was asked to continue vancomycin with hemodialysis and Zosyn was changed to Augmentin per Renal recommendations for about 14-day course of antibiotics.  Dr. Myra Gianotti ([facility]) consulted on admission. He would like cellulitis treated before considering revascularization. She was asked to follow-up with him as an outpatient after discharge.  RENAL  ESRD on HD - On TThS schedule in [place], [place]. Baseline Cr appears to be ~3.5-5. She was resumed on regular dialysis schedule.  ENDO  DM type II with multiple complications - Pt on Lantus 33 units qHS, Humalog SSI TID at home, with Reglan for gastroparesis. Last A1c 6.9 in July. Multiple chronic poorly-healing wounds: right LE ulcers, groin incisions from  previous bypass surgery, multiple shallow skin ulcerations to neck/shoulders, back, and buttocks. Blood sugars controlled on home Lantus, Humalog SSI, and Reglan.   CV  HTN Lower blood pressures initially due to sepsis so Lasix was held. Her blood pressures were improved at the time of discharge and she was asked to resume her home Lasix.  CAD/PAD with history of MI - S/p stent placement in 2003; cath in 2009 showed some occlusion of stent with angioplasty and subsequent medically managed restenosis. Aortobifemoral graft and above-the-knee femoral-popliteal bypass on the right leg in May 2013. Echo in 01/2012 showed EF of 60-65% with normal systolic function and mild LVH. She was resumed on Plavix and compression hose was added. See above regarding [facility] recommendations.  RESP  Desaturations to 70s overnight 10/10 She was placed on CPAP at that time but has not required since. Sleep study deferred to PCP. She had declined sleep study previously due to concern about wearing CPAP mask but she may be amenable if this is recommended from sleep study results.  Follow-up issues:  -Resolution of cellulitis. Please make sure she is taking Augmentin through 10/23 and is receiving vancomycin at dialysis, first dose on 10/15 through 10/22.  -PCP may consider sleep study due to concern for OSA  -Consulting nephrologist has asked patient to discuss with primary nephrologist phosphate binder since she appears to be on large dose   Consults: Renal, V[facility]  Disposition: 01-Home or Self Care  Discharge Orders    Future Appointments: Provider: Department: Dept Phone: Center:   06/09/2012 1:40 PM Evern Bio, NP [facility] [phone] [facility]   08/04/2012 10:30 AM [facility] [phone] [facility]   08/04/2012 11:00 AM [facility] [phone]  [facility]   08/04/2012 11:30 AM Nada Libman, MD [facility] [phone] [facility]   08/25/2012 2:30 PM Malissa Hippo, MD [facility]  [phone] None       Medication List     As of 06/08/2012  4:24 PM    TAKE these medications         albuterol 108 (90 BASE) MCG/ACT inhaler   Commonly known as: PROVENTIL HFA;VENTOLIN HFA   Inhale 2 puffs into the lungs every 6 (six) hours as needed. Shortness of breath      ALPRAZolam 1 MG tablet   Commonly known as: XANAX   Take 1 tablet (1 mg total) by mouth 3 (three) times daily as needed. For anxiety      amoxicillin-clavulanate 400-57 MG/5ML suspension   Commonly known as: AUGMENTIN   Take 5 mLs (400 mg total) by mouth daily. Take after dialysis on Tuesdays, Thursdays, and Saturdays.   Start taking on: 06/09/2012      ARIPiprazole 2 MG tablet   Commonly known as: ABILIFY   Take 10 mg by mouth daily.      b complex-vitamin c-folic acid 0.8 MG Tabs   Take 0.8 mg by mouth at bedtime.      bacitracin ophthalmic ointment   Place 1 application into both eyes at bedtime. apply to eye      butalbital-acetaminophen-caffeine 50-325-40 MG per tablet   Commonly known as: FIORICET, ESGIC   Take 1 tablet by mouth 3 (three) times daily after meals. For headaches      cinacalcet 30 MG tablet   Commonly known as: SENSIPAR   Take 30 mg by mouth daily with supper.      clopidogrel 75 MG tablet   Commonly known as: PLAVIX   Take 75 mg by mouth daily. Last dose 12/17/2011      collagenase ointment   Commonly known as: SANTYL   Apply 1 application topically daily.      darbepoetin 60 MCG/0.3ML Soln   Commonly known as: ARANESP   Inject 60 mcg into the vein every Saturday with hemodialysis.      fluticasone 50 MCG/ACT nasal spray   Commonly known as: FLONASE   Place 1 spray into the nose.      furosemide 40 MG tablet   Commonly known as: LASIX   Take 40 mg by mouth daily.      insulin glargine 100 UNIT/ML injection   Commonly known as: LANTUS   Inject 33 Units into the skin at bedtime.      insulin lispro 100 UNIT/ML injection   Commonly known as: HUMALOG   Inject  7-15 Units into the skin 4 (four) times daily -  before meals and at bedtime. TAKING ON SLIDING SCALE  200-249=7 units  250-299=10 units  300-349=12 units  >350=15 units      methocarbamol 500 MG tablet   Commonly known as: ROBAXIN   Take 500 mg by mouth 2 (two) times daily.      metoCLOPramide 10 MG tablet   Commonly known as: REGLAN   Take 10 mg by mouth 4 (four) times daily.      ofloxacin 0.3 % ophthalmic solution   Commonly known as: OCUFLOX   Place 1 drop into both eyes 3 (three) times daily.      omeprazole 40 MG capsule   Commonly known as: PRILOSEC   Take 40 mg by mouth daily.      oxyCODONE-acetaminophen 10-325 MG per tablet   Commonly  known as: PERCOCET   Take 1 tablet by mouth every 4 (four) hours as needed. For pain      PARoxetine 40 MG tablet   Commonly known as: PAXIL   Take 80 mg by mouth every morning.      promethazine 25 MG tablet   Commonly known as: PHENERGAN   Take 25 mg by mouth every 6 (six) hours as needed. For nausea      pyridoxine 100 MG tablet   Commonly known as: B-6   Take 100 mg by mouth daily.      sevelamer 800 MG tablet   Commonly known as: RENVELA   Take 1,600-4,000 mg by mouth 5 (five) times daily. *Take 5 tablets daily with each meal and take 2 tablets with snacks**      vancomycin 1 GM/200ML Soln   Commonly known as: VANCOCIN   Inject 200 mLs (1,000 mg total) into the vein Every Tuesday,Thursday,and Saturday with dialysis.           Follow-up Information    Schedule an appointment as soon as possible for a visit with Ernestine Conrad, MD.   Contact information:   [facility] [address] [place] [address] [phone]       Follow up with Jorge Ny, MD. (Call on 10/14 and schedule an appointment in 1 week.)    Contact information:   [address] [place] [address] [phone]          Signed: [facility] PARK, ANGELA 06/08/2012, 4:24 PM

## 2012-06-08 NOTE — Care Management Note (Signed)
    Page 1 of 1   06/08/2012     3:41:49 PM   CARE MANAGEMENT NOTE 06/08/2012  Patient:  [name], [name]   Account Number:  [id number]  Date Initiated:  06/06/2012  Documentation initiated by:  Alvira Philips Assessment:   41 yr-old female adm with dx of ?sepsis; lives with mother and sister, active with [facility] and with [facility]     Action/Plan:   Anticipated DC Date:     Anticipated DC Plan:        DC Planning Services  CM consult      [facility] Choice  Resumption Of Svcs/PTA Provider   Choice offered to / List presented to:          [facility] arranged  HH-2 PT  HH-3 OT      [facility] agency  [facility]   Status of service:  Completed, signed off Medicare Important Message given?   (If response is "NO", the following Medicare IM given date fields will be blank) Date Medicare IM given:   Date Additional Medicare IM given:    Discharge Disposition:  HOME W HOME HEALTH SERVICES  Per UR Regulation:  Reviewed for med. necessity/level of care/duration of stay  If discussed at Long Length of Stay Meetings, dates discussed:    Comments:  PCP:  Dr. Ernestine Conrad  06/08/12-1533-J.Gwenda Heiner,RN,BSN [phone]      Noted orders for home health. Patient is already active with [facility]. Demographics, Face to Face, orders, and notes faxed to [facility] at [phone]- and [phone]. No other needs identified at this time. Patient is being discharged home with home health services today.   06/06/12 1430 Henrietta Mayo RN MSN CCM Pt has CNA 26 hr/wk through [facility], [facility] currently provides PT/OT services.  Pt has w/c, rolling walker, shower chair, 3-N-1, mother states she needs tub bench, replacement 3-N-1, and air overlay mattress.  TC to [facility] CM, Danelle Earthly [phone]), left VM message requesting return call.  Sister reports pt smokes 3 packs of cigarettes daily, sleeps sitting up in recliner, has frequent periods of apnea.  Pt initially  stated she did not want sleep study because she would not wear a mask, but later agreed to reconsider.

## 2012-06-08 NOTE — Progress Notes (Signed)
S: No new CO O:BP 161/73  Pulse 97  Temp 97.3 F (36.3 C) (Oral)  Resp 11  Ht 5' 10.47" (1.79 m)  Wt 86.8 kg (191 lb 5.8 oz)  BMI 27.09 kg/m2  SpO2 100%  LMP 09/03/2008  Intake/Output Summary (Last 24 hours) at 06/08/12 1610 Last data filed at 06/07/12 2106  Gross per 24 hour  Intake    700 ml  Output   3900 ml  Net  -3200 ml   Weight change: 1.1 kg (2 lb 6.8 oz) RUE:AVWUJ and alert CVS:RRR Resp:clear Abd:+ BS NTND Ext:lt lower ext less swollen and less erythematous, wearing TED hose NEURO: CNI Ox3 no asterixis SKIN:  Several superficial wounds on buttocks      . ARIPiprazole  10 mg Oral Daily  . bacitracin-polymyxin b   Both Eyes QHS  . Chlorhexidine Gluconate Cloth  6 each Topical Q0600  . cinacalcet  30 mg Oral Q supper  . clopidogrel  75 mg Oral Daily  . darbepoetin  60 mcg Intravenous Q Sat-HD  . enoxaparin  30 mg Subcutaneous Q24H  . feeding supplement (NEPRO CARB STEADY)  237 mL Oral BID BM  . fluticasone  1 spray Each Nare Daily  . insulin aspart  7-15 Units Subcutaneous TID AC & HS  . insulin glargine  33 Units Subcutaneous QHS  . methocarbamol  500 mg Oral BID  . metoCLOPramide  10 mg Oral QID  . multivitamin  1 tablet Oral Daily  . mupirocin ointment  1 application Nasal BID  . nicotine  21 mg Transdermal Daily  . ofloxacin  1 drop Both Eyes TID  . pantoprazole  40 mg Oral Q1200  . PARoxetine  80 mg Oral Daily  . piperacillin-tazobactam (ZOSYN)  IV  2.25 g Intravenous Q8H  . pyridoxine  100 mg Oral Daily  . sevelamer  4,000 mg Oral TID WC  . vancomycin  1,250 mg Intravenous Once  . vancomycin  1,000 mg Intravenous Q [name],Th,Sa-HD   No results found. BMET    Component Value Date/Time   NA 135 06/07/2012 0855   K 4.0 06/07/2012 0855   CL 95* 06/07/2012 0855   CO2 23 06/07/2012 0855   GLUCOSE 132* 06/07/2012 0855   BUN 37* 06/07/2012 0855   CREATININE 5.28* 06/07/2012 0855   CREATININE 1.78* 10/17/2008 2000   CALCIUM 9.7 06/07/2012 0855   CALCIUM 8.3* 08/21/2011 0533   GFRNONAA 9* 06/07/2012 0855   GFRAA 11* 06/07/2012 0855   CBC    Component Value Date/Time   WBC 16.9* 06/07/2012 0855   RBC 3.04* 06/07/2012 0855   HGB 9.2* 06/07/2012 0855   HCT 30.2* 06/07/2012 0855   PLT 356 06/07/2012 0855   MCV 99.3 06/07/2012 0855   MCH 30.3 06/07/2012 0855   MCHC 30.5 06/07/2012 0855   RDW 20.2* 06/07/2012 0855   LYMPHSABS 0.7 06/05/2012 1657   MONOABS 1.5* 06/05/2012 1657   EOSABS 0.1 06/05/2012 1657   BASOSABS 0.1 06/05/2012 1657     Assessment: 1. Lt leg cellulitis, improved 2. Sec HPTH, on sensipar 3.ESRD 4. HTN 5. DM  Plan: 1. Cont antibiotics.  Vanco can be given at outpt center but not Zosyn.  Could use PO augmentin at time of DC  [name]

## 2012-06-08 NOTE — Consult Note (Signed)
ANTIBIOTIC CONSULT NOTE - Follow Up  Pharmacy Consult for Vancomycin and Zosyn Indication: cellulitis  Allergies  Allergen Reactions  . Compazine (Prochlorperazine) Swelling    Per echart records (takes promethazine at home)  . Contrast Media (Iodinated Diagnostic Agents) Swelling    Per echart records  . Meperidine Hcl Swelling  . Metformin And Related Other (See Comments)    'just not myself, was told not to take metformin'  . Morphine Swelling  . Nubain (Nalbuphine Hcl) Swelling  . Tobramycin   . Topiramate (Topamax) Swelling    Tongue swelling - per echart records  . Toradol (Ketorolac Tromethamine) Swelling   Patient Measurements: Height: 5' 10.47" (179 cm) Weight: 191 lb 5.8 oz (86.8 kg) IBW/kg (Calculated) : 69.59   Vital Signs: Temp: 97.5 F (36.4 C) (10/13 1230) Temp src: Oral (10/13 1230) BP: 163/85 mmHg (10/13 1400) Pulse Rate: 88  (10/13 1400) Intake/Output from previous day: 10/12 0701 - 10/13 0700 In: 750 [P.O.:600; IV Piggyback:150] Out: 3900  Intake/Output from this shift: Total I/O In: 720 [P.O.:720] Out: -   Labs: Estimated Creatinine Clearance: 16.8 ml/min (by C-G formula based on Cr of 5.28).  Microbiology: Recent Results (from the past 720 hour(s))  CULTURE, BLOOD (ROUTINE X 2)     Status: Normal (Preliminary result)   Collection Time   06/05/12  4:50 PM      Component Value Range Status Comment   Specimen Description BLOOD HAND LEFT   Final    Special Requests BOTTLES DRAWN AEROBIC ONLY 5CC   Final    Culture  Setup Time 06/05/2012 21:04   Final    Culture     Final    Value:        BLOOD CULTURE RECEIVED NO GROWTH TO DATE CULTURE WILL BE HELD FOR 5 DAYS BEFORE ISSUING A FINAL NEGATIVE REPORT   Report Status PENDING   Incomplete   CULTURE, BLOOD (ROUTINE X 2)     Status: Normal (Preliminary result)   Collection Time   06/05/12  4:51 PM      Component Value Range Status Comment   Specimen Description BLOOD HAND RIGHT   Final    Special  Requests BOTTLES DRAWN AEROBIC ONLY 10CC   Final    Culture  Setup Time 06/05/2012 21:04   Final    Culture     Final    Value:        BLOOD CULTURE RECEIVED NO GROWTH TO DATE CULTURE WILL BE HELD FOR 5 DAYS BEFORE ISSUING A FINAL NEGATIVE REPORT   Report Status PENDING   Incomplete   MRSA PCR SCREENING     Status: Abnormal   Collection Time   06/06/12  1:39 AM      Component Value Range Status Comment   MRSA by PCR POSITIVE (*) NEGATIVE Final     Medical History: Past Medical History  Diagnosis Date  . DVT (deep venous thrombosis)     DVT HISTORY  . Diabetes mellitus   . Migraine     HISTORY  . Anxiety and depression   . CAD (coronary artery disease)     Last cath 2009:  LAD stent 80% stenosis. Circumflex 60-70% stenosis AV groove, right coronary artery 50-60% stenosis. She did have cutting balloon angioplasty of the LAD lesion into a diagonal. However, there was restenosis of this and it was managed medically.  . Bipolar 1 disorder   . Hyperlipidemia   . Hyperparathyroidism   . PE (pulmonary embolism)  HISTORY  . Dialysis patient   . Anemia   . Renal failure     dialysis [place] t,th sat  . Nephrotic syndrome   . Peripheral vascular disease   . MRSA infection   . Anxiety   . Depression   . Gastroparesis   . Complication of anesthesia   . PONV (postoperative nausea and vomiting)     x3 days post anesth. on 12/19/2011  . Myocardial infarction     x3 , last one - 10 yrs. ago  . Hypertension     sees Dr. Antoine Poche - [place], last stress test 11/2011, see result in EPIC, saw Dr. Antoine Poche as well at that time    . Shortness of breath   . Recurrent upper respiratory infection (URI)     Bronchitis 11/2011 - saw Dr. Loney Hering, treating currently /w antibiotic   . GERD (gastroesophageal reflux disease)   . Pneumonia     [facility]- 2012  . Noncompliance of patient with renal dialysis    Assessment: 41yof on hemodialysis TTS to begin vancomycin for LLE cellulitis.   She has had some  improvement in her cellulitis and pain in her left leg.  As noted, no erythema, warmth or edema but presence of slow-healing ulceration.    Goal of Therapy:  Vancomycin pre-HD level 15-25  Plan:  1)  Continue current Vancomycin regimen  2)  Continue IV Zosyn for now 3) Follow up oral conversion of Zosyn to Augmentin prior to discharge.  Nadara Mustard, PharmD., MS Clinical Pharmacist Pager:  [phone] Thank you for allowing pharmacy to be part of this patients care team. 06/08/2012,2:56 PM

## 2012-06-09 ENCOUNTER — Encounter: Payer: Self-pay | Admitting: Neurosurgery

## 2012-06-09 ENCOUNTER — Ambulatory Visit (INDEPENDENT_AMBULATORY_CARE_PROVIDER_SITE_OTHER): Payer: Medicare Other | Admitting: Neurosurgery

## 2012-06-09 VITALS — BP 133/74 | HR 103 | Resp 16 | Ht 70.0 in | Wt 191.0 lb

## 2012-06-09 DIAGNOSIS — L0390 Cellulitis, unspecified: Secondary | ICD-10-CM

## 2012-06-09 DIAGNOSIS — L0291 Cutaneous abscess, unspecified: Secondary | ICD-10-CM

## 2012-06-09 DIAGNOSIS — I739 Peripheral vascular disease, unspecified: Secondary | ICD-10-CM

## 2012-06-09 NOTE — Progress Notes (Signed)
[facility] Office Note  CC: Followup for left lower extremity cellulitis Referring Physician: Myra Gianotti  History of Present Illness: Is a female patient of Dr. Myra Gianotti is status post a aorta bifemoral bypass graft in a right femoral to above-the-knee popliteal artery bypass in May 2013. The patient was seen a week ago for left lower extremity cellulitis at which time we admitted her to [facility] for IV antibiotic therapy. The patient was discharge last night and seen today in the clinic for followup. She states she is still tender in her left lower extremity however the cellulitis appears to be much improved. Dr. Myra Gianotti did round on her in the hospital over this weekend.  Past Medical History  Diagnosis Date  . DVT (deep venous thrombosis)     DVT HISTORY  . Diabetes mellitus   . Migraine     HISTORY  . Anxiety and depression   . CAD (coronary artery disease)     Last cath 2009:  LAD stent 80% stenosis. Circumflex 60-70% stenosis AV groove, right coronary artery 50-60% stenosis. She did have cutting balloon angioplasty of the LAD lesion into a diagonal. However, there was restenosis of this and it was managed medically.  . Bipolar 1 disorder   . Hyperlipidemia   . Hyperparathyroidism   . PE (pulmonary embolism)     HISTORY  . Dialysis patient   . Anemia   . Renal failure     dialysis [place] t,th sat  . Nephrotic syndrome   . Peripheral vascular disease   . MRSA infection   . Anxiety   . Depression   . Gastroparesis   . Complication of anesthesia   . PONV (postoperative nausea and vomiting)     x3 days post anesth. on 12/19/2011  . Myocardial infarction     x3 , last one - 10 yrs. ago  . Hypertension     sees Dr. Antoine Poche - [place], last stress test 11/2011, see result in EPIC, saw Dr. Antoine Poche as well at that time    . Shortness of breath   . Recurrent upper respiratory infection (URI)     Bronchitis 11/2011 - saw Dr. Loney Hering, treating currently /w  antibiotic   . GERD (gastroesophageal reflux disease)   . Pneumonia     [facility]- 2012  . Noncompliance of patient with renal dialysis     ROS: [x]  Positive   [ ]  Denies    General: [ ]  Weight loss, [ ]  Fever, [ ]  chills Neurologic: [ ]  Dizziness, [ ]  Blackouts, [ ]  Seizure [ ]  Stroke, [ ]  "Mini stroke", [ ]  Slurred speech, [ ]  Temporary blindness; [ ]  weakness in arms or legs, [ ]  Hoarseness Cardiac: [ ]  Chest pain/pressure, [ ]  Shortness of breath at rest [ ]  Shortness of breath with exertion, [ ]  Atrial fibrillation or irregular heartbeat Vascular: [ ]  Pain in legs with walking, [ ]  Pain in legs at rest, [ ]  Pain in legs at night,  [ ]  Non-healing ulcer, [ ]  Blood clot in vein/DVT,   Pulmonary: [ ]  Home oxygen, [ ]  Productive cough, [ ]  Coughing up blood, [ ]  Asthma,  [ ]  Wheezing Musculoskeletal:  [ ]  Arthritis, [ ]  Low back pain, [ ]  Joint pain Hematologic: [ ]  Easy Bruising, [ ]  Anemia; [ ]  Hepatitis Gastrointestinal: [ ]  Blood in stool, [ ]  Gastroesophageal Reflux/heartburn, [ ]  Trouble swallowing Urinary: [ ]  chronic Kidney disease, [ ]  on HD - [ ]   MWF or [ ]  TTHS, [ ]  Burning with urination, [ ]  Difficulty urinating Skin: [ ]  Rashes, [ ]  Wounds Psychological: [ ]  Anxiety, [ ]  Depression   Social History History  Substance Use Topics  . Smoking status: Current Every Day Smoker -- 3.0 packs/day for 20 years    Types: Cigarettes  . Smokeless tobacco: Never Used   Comment: pt states she is not ready to quit   . Alcohol Use: No    Family History Family History  Problem Relation Age of Onset  . Transient ischemic attack Mother   . Hepatitis Sister   . Diabetes type II Other   . Anesthesia problems Neg Hx   . Hypotension Neg Hx   . Malignant hyperthermia Neg Hx   . Pseudochol deficiency Neg Hx     Allergies  Allergen Reactions  . Compazine (Prochlorperazine) Swelling    Per echart records (takes promethazine at home)  . Contrast Media (Iodinated Diagnostic Agents)  Swelling    Per echart records  . Meperidine Hcl Swelling  . Metformin And Related Other (See Comments)    'just not myself, was told not to take metformin'  . Morphine Swelling  . Nubain (Nalbuphine Hcl) Swelling  . Tobramycin   . Topiramate (Topamax) Swelling    Tongue swelling - per echart records  . Toradol (Ketorolac Tromethamine) Swelling    Current Outpatient Prescriptions  Medication Sig Dispense Refill  . albuterol (PROVENTIL HFA;VENTOLIN HFA) 108 (90 BASE) MCG/ACT inhaler Inhale 2 puffs into the lungs every 6 (six) hours as needed. Shortness of breath      . ALPRAZolam (XANAX) 1 MG tablet Take 1 tablet (1 mg total) by mouth 3 (three) times daily as needed. For anxiety  60 tablet  0  . amoxicillin-clavulanate (AUGMENTIN) 400-57 MG/5ML suspension Take 5 mLs (400 mg total) by mouth daily. Take after dialysis on Tuesdays, Thursdays, and Saturdays.  50 mL  0  . ARIPiprazole (ABILIFY) 2 MG tablet Take 10 mg by mouth daily.       Marland Kitchen b complex-vitamin c-folic acid (NEPHRO-VITE) 0.8 MG TABS Take 0.8 mg by mouth at bedtime.      . bacitracin ophthalmic ointment Place 1 application into both eyes at bedtime. apply to eye      . butalbital-acetaminophen-caffeine (FIORICET, ESGIC) 50-325-40 MG per tablet Take 1 tablet by mouth 3 (three) times daily after meals. For headaches  45 tablet  0  . cinacalcet (SENSIPAR) 30 MG tablet Take 30 mg by mouth daily with supper.       . clopidogrel (PLAVIX) 75 MG tablet Take 75 mg by mouth daily. Last dose 12/17/2011      . collagenase (SANTYL) ointment Apply 1 application topically daily.      . darbepoetin (ARANESP) 60 MCG/0.3ML SOLN Inject 60 mcg into the vein every Saturday with hemodialysis.      . fluticasone (FLONASE) 50 MCG/ACT nasal spray Place 1 spray into the nose.       . furosemide (LASIX) 40 MG tablet Take 40 mg by mouth daily.       . insulin glargine (LANTUS) 100 UNIT/ML injection Inject 33 Units into the skin at bedtime.      . insulin  lispro (HUMALOG) 100 UNIT/ML injection Inject 7-15 Units into the skin 4 (four) times daily -  before meals and at bedtime. TAKING ON SLIDING SCALE 200-249=7 units 250-299=10 units 300-349=12 units >350=15 units      . methocarbamol (ROBAXIN) 500 MG tablet Take  500 mg by mouth 2 (two) times daily.       . metoCLOPramide (REGLAN) 10 MG tablet Take 10 mg by mouth 4 (four) times daily.      Marland Kitchen ofloxacin (OCUFLOX) 0.3 % ophthalmic solution Place 1 drop into both eyes 3 (three) times daily.       Marland Kitchen omeprazole (PRILOSEC) 40 MG capsule Take 40 mg by mouth daily.       Marland Kitchen oxyCODONE-acetaminophen (PERCOCET) 10-325 MG per tablet Take 1 tablet by mouth every 4 (four) hours as needed. For pain      . PARoxetine (PAXIL) 40 MG tablet Take 80 mg by mouth every morning.       . promethazine (PHENERGAN) 25 MG tablet Take 25 mg by mouth every 6 (six) hours as needed. For nausea      . pyridoxine (B-6) 100 MG tablet Take 100 mg by mouth daily.      . sevelamer (RENVELA) 800 MG tablet Take 1,600-4,000 mg by mouth 5 (five) times daily. *Take 5 tablets daily with each meal and take 2 tablets with snacks**      . ULTICARE INSULIN SYRINGE 31G X 5/16" 1 ML MISC       . vancomycin (VANCOCIN) 1 GM/200ML SOLN Inject 200 mLs (1,000 mg total) into the vein Every Tuesday,Thursday,and Saturday with dialysis.  4000 mL  0   No current facility-administered medications for this visit.   Facility-Administered Medications Ordered in Other Visits  Medication Dose Route Frequency Provider Last Rate Last Dose  . DISCONTD: 0.9 %  sodium chloride infusion  250 mL Intravenous PRN Josalyn Funches, MD 10 mL/hr at 06/06/12 0200 250 mL at 06/06/12 0200  . DISCONTD: albuterol (PROVENTIL HFA;VENTOLIN HFA) 108 (90 BASE) MCG/ACT inhaler 2 puff  2 puff Inhalation Q6H PRN Josalyn Funches, MD      . DISCONTD: ALPRAZolam Prudy Feeler) tablet 1 mg  1 mg Oral TID PRN Dessa Phi, MD   1 mg at 06/08/12 0748  . DISCONTD: ARIPiprazole (ABILIFY) tablet 10  mg  10 mg Oral Daily Dessa Phi, MD   10 mg at 06/08/12 0942  . DISCONTD: bacitracin-polymyxin b (POLYSPORIN) ophthalmic ointment   Both Eyes QHS Dessa Phi, MD      . DISCONTD: butalbital-acetaminophen-caffeine (FIORICET, ESGIC) 50-325-40 MG per tablet 1 tablet  1 tablet Oral TID PRN Bobbye Morton, MD   1 tablet at 06/08/12 1252  . DISCONTD: Chlorhexidine Gluconate Cloth 2 % PADS 6 each  6 each Topical Q0600 Tobey Grim, MD   6 each at 06/08/12 [phone]  . DISCONTD: cinacalcet (SENSIPAR) tablet 30 mg  30 mg Oral Q supper Dessa Phi, MD   30 mg at 06/07/12 1727  . DISCONTD: clopidogrel (PLAVIX) tablet 75 mg  75 mg Oral Daily Dessa Phi, MD   75 mg at 06/08/12 0748  . DISCONTD: darbepoetin (ARANESP) injection 60 mcg  60 mcg Intravenous Q Sat-HD Dessa Phi, MD   60 mcg at 06/07/12 1102  . DISCONTD: enoxaparin (LOVENOX) injection 30 mg  30 mg Subcutaneous Q24H Josalyn Funches, MD   30 mg at 06/08/12 0748  . DISCONTD: feeding supplement (NEPRO CARB STEADY) liquid 237 mL  237 mL Oral BID BM [address], MD   237 mL at 06/08/12 1132  . DISCONTD: fluticasone (FLONASE) 50 MCG/ACT nasal spray 1 spray  1 spray Each Nare Daily Dessa Phi, MD   1 spray at 06/08/12 0943  . DISCONTD: insulin aspart (novoLOG) injection 7-15 Units  7-15 Units  Subcutaneous TID AC & HS Josalyn Funches, MD      . DISCONTD: insulin glargine (LANTUS) injection 33 Units  33 Units Subcutaneous QHS Dessa Phi, MD   33 Units at 06/07/12 2213  . DISCONTD: methocarbamol (ROBAXIN) tablet 500 mg  500 mg Oral BID Dessa Phi, MD   500 mg at 06/08/12 0942  . DISCONTD: metoCLOPramide (REGLAN) tablet 10 mg  10 mg Oral QID Dessa Phi, MD   10 mg at 06/08/12 0942  . DISCONTD: multivitamin (RENA-VIT) tablet 1 tablet  1 tablet Oral Daily Dessa Phi, MD   1 tablet at 06/08/12 0943  . DISCONTD: mupirocin ointment (BACTROBAN) 2 % 1 application  1 application Nasal BID Tobey Grim, MD   1  application at 06/08/12 0944  . DISCONTD: nicotine (NICODERM CQ - dosed in mg/24 hours) patch 21 mg  21 mg Transdermal Daily Dessa Phi, MD   21 mg at 06/08/12 0942  . DISCONTD: ofloxacin (OCUFLOX) 0.3 % ophthalmic solution 1 drop  1 drop Both Eyes TID Dessa Phi, MD   1 drop at 06/08/12 0943  . DISCONTD: oxyCODONE (Oxy IR/ROXICODONE) immediate release tablet 5 mg  5 mg Oral Q4H PRN Tobey Grim, MD   5 mg at 06/08/12 0748  . DISCONTD: oxyCODONE-acetaminophen (PERCOCET/ROXICET) 5-325 MG per tablet 1 tablet  1 tablet Oral Q4H PRN Tobey Grim, MD   1 tablet at 06/08/12 0748  . DISCONTD: pantoprazole (PROTONIX) EC tablet 40 mg  40 mg Oral Q1200 Josalyn Funches, MD   40 mg at 06/08/12 1252  . DISCONTD: PARoxetine (PAXIL) tablet 80 mg  80 mg Oral Daily Dessa Phi, MD   80 mg at 06/08/12 0943  . DISCONTD: piperacillin-tazobactam (ZOSYN) IVPB 2.25 g  2.25 g Intravenous [address] [place], PHARMD   2.25 g at 06/08/12 1610  . DISCONTD: promethazine (PHENERGAN) tablet 25 mg  25 mg Oral Q6H PRN Dessa Phi, MD   25 mg at 06/07/12 0116  . DISCONTD: pyridOXINE (VITAMIN B-6) tablet 100 mg  100 mg Oral Daily Dessa Phi, MD   100 mg at 06/08/12 0942  . DISCONTD: sevelamer (RENVELA) tablet 1,600 mg  1,600 mg Oral BID PRN Tobey Grim, MD      . DISCONTD: sevelamer (RENVELA) tablet 4,000 mg  4,000 mg Oral TID WC Tobey Grim, MD   4,000 mg at 06/08/12 0943  . DISCONTD: vancomycin (VANCOCIN) IVPB 1000 mg/200 mL premix  1,000 mg Intravenous Q T,Th,Sa-HD Crystal Salomon Fick, PHARMD        Physical Examination  Filed Vitals:   06/09/12 1403  BP: 133/74  Pulse: 103  Resp: 16    Body mass index is 27.41 kg/(m^2).  General:  WDWN in NAD Gait: Normal HEENT: WNL Eyes: Pupils equal Pulmonary: normal non-labored breathing , without Rales, rhonchi,  wheezing Cardiac: RRR, without  Murmurs, rubs or gallops; No carotid bruits Abdomen: soft, NT, no masses Skin:  no rashes, ulcers noted Vascular Exam/Pulses: Lower extremity pulses are not palpated  Extremities without ischemic changes, no Gangrene , no cellulitis; no open wounds;  Musculoskeletal: no muscle wasting or atrophy  Neurologic: A&O X 3; Appropriate Affect ; SENSATION: normal; MOTOR FUNCTION:  moving all extremities equally. Speech is fluent/normal  Non-Invasive Vascular Imaging: None  ASSESSMENT/PLAN: Improving left lower extremity cellulitis, the patient does have antibiotics to take at home as well as still receive IV antibiotics during hemodialysis. I did speak with Dr. Myra Gianotti who states the patient can followup as  scheduled in the next couple months with right lower extremity duplex. The patient's questions were encouraged and answered, she is in agreement with this plan. The patient is to continue her compression as Dr. Myra Gianotti recommended an as well as her antibiotics until they're discharged.  Lauree Chandler ANP   Clinic M.D.: Myra Gianotti

## 2012-06-10 NOTE — Discharge Summary (Signed)
I have seen and examined this patient. I have discussed with Dr Oh [place].  I agree with their findings and plans as documented in their discharge note.    

## 2012-06-11 LAB — CULTURE, BLOOD (ROUTINE X 2): Culture: NO GROWTH

## 2012-06-16 ENCOUNTER — Other Ambulatory Visit (HOSPITAL_COMMUNITY): Payer: Self-pay | Admitting: Nephrology

## 2012-06-16 DIAGNOSIS — N186 End stage renal disease: Secondary | ICD-10-CM

## 2012-06-17 ENCOUNTER — Ambulatory Visit (HOSPITAL_COMMUNITY)
Admission: RE | Admit: 2012-06-17 | Discharge: 2012-06-17 | Disposition: A | Discharge status: Home / Self Care | Payer: Medicare Other | Source: Ambulatory Visit | Attending: Nephrology | Admitting: Nephrology

## 2012-06-17 ENCOUNTER — Encounter (HOSPITAL_COMMUNITY): Payer: Self-pay

## 2012-06-17 ENCOUNTER — Emergency Department (HOSPITAL_COMMUNITY): Payer: Medicare Other

## 2012-06-17 ENCOUNTER — Inpatient Hospital Stay (HOSPITAL_COMMUNITY)
Admission: EM | Admit: 2012-06-17 | Discharge: 2012-06-28 | DRG: 637 | Disposition: A | Discharge status: Home Health | Payer: Medicare Other | Attending: Family Medicine | Admitting: Family Medicine

## 2012-06-17 ENCOUNTER — Encounter (HOSPITAL_COMMUNITY): Payer: Self-pay | Admitting: Emergency Medicine

## 2012-06-17 DIAGNOSIS — I252 Old myocardial infarction: Secondary | ICD-10-CM

## 2012-06-17 DIAGNOSIS — K3184 Gastroparesis: Secondary | ICD-10-CM | POA: Diagnosis present

## 2012-06-17 DIAGNOSIS — G5793 Unspecified mononeuropathy of bilateral lower limbs: Secondary | ICD-10-CM | POA: Diagnosis present

## 2012-06-17 DIAGNOSIS — N186 End stage renal disease: Secondary | ICD-10-CM

## 2012-06-17 DIAGNOSIS — I120 Hypertensive chronic kidney disease with stage 5 chronic kidney disease or end stage renal disease: Secondary | ICD-10-CM | POA: Diagnosis present

## 2012-06-17 DIAGNOSIS — E118 Type 2 diabetes mellitus with unspecified complications: Secondary | ICD-10-CM

## 2012-06-17 DIAGNOSIS — K922 Gastrointestinal hemorrhage, unspecified: Secondary | ICD-10-CM | POA: Diagnosis not present

## 2012-06-17 DIAGNOSIS — G9340 Encephalopathy, unspecified: Secondary | ICD-10-CM | POA: Diagnosis not present

## 2012-06-17 DIAGNOSIS — E1110 Type 2 diabetes mellitus with ketoacidosis without coma: Secondary | ICD-10-CM | POA: Diagnosis present

## 2012-06-17 DIAGNOSIS — L89109 Pressure ulcer of unspecified part of back, unspecified stage: Secondary | ICD-10-CM | POA: Diagnosis present

## 2012-06-17 DIAGNOSIS — R579 Shock, unspecified: Secondary | ICD-10-CM | POA: Diagnosis present

## 2012-06-17 DIAGNOSIS — D509 Iron deficiency anemia, unspecified: Secondary | ICD-10-CM | POA: Diagnosis present

## 2012-06-17 DIAGNOSIS — E1310 Other specified diabetes mellitus with ketoacidosis without coma: Principal | ICD-10-CM | POA: Diagnosis present

## 2012-06-17 DIAGNOSIS — Z91199 Patient's noncompliance with other medical treatment and regimen due to unspecified reason: Secondary | ICD-10-CM

## 2012-06-17 DIAGNOSIS — Z720 Tobacco use: Secondary | ICD-10-CM | POA: Diagnosis present

## 2012-06-17 DIAGNOSIS — I7025 Atherosclerosis of native arteries of other extremities with ulceration: Secondary | ICD-10-CM | POA: Diagnosis present

## 2012-06-17 DIAGNOSIS — E669 Obesity, unspecified: Secondary | ICD-10-CM | POA: Diagnosis present

## 2012-06-17 DIAGNOSIS — I2510 Atherosclerotic heart disease of native coronary artery without angina pectoris: Secondary | ICD-10-CM | POA: Diagnosis present

## 2012-06-17 DIAGNOSIS — R7309 Other abnormal glucose: Secondary | ICD-10-CM | POA: Insufficient documentation

## 2012-06-17 DIAGNOSIS — Z9119 Patient's noncompliance with other medical treatment and regimen: Secondary | ICD-10-CM

## 2012-06-17 DIAGNOSIS — L0390 Cellulitis, unspecified: Secondary | ICD-10-CM | POA: Diagnosis present

## 2012-06-17 DIAGNOSIS — E1165 Type 2 diabetes mellitus with hyperglycemia: Secondary | ICD-10-CM | POA: Diagnosis present

## 2012-06-17 DIAGNOSIS — I872 Venous insufficiency (chronic) (peripheral): Secondary | ICD-10-CM | POA: Diagnosis present

## 2012-06-17 DIAGNOSIS — G8929 Other chronic pain: Secondary | ICD-10-CM | POA: Diagnosis present

## 2012-06-17 DIAGNOSIS — E1143 Type 2 diabetes mellitus with diabetic autonomic (poly)neuropathy: Secondary | ICD-10-CM | POA: Diagnosis present

## 2012-06-17 DIAGNOSIS — D72829 Elevated white blood cell count, unspecified: Secondary | ICD-10-CM | POA: Diagnosis present

## 2012-06-17 DIAGNOSIS — F319 Bipolar disorder, unspecified: Secondary | ICD-10-CM | POA: Diagnosis present

## 2012-06-17 DIAGNOSIS — E874 Mixed disorder of acid-base balance: Secondary | ICD-10-CM | POA: Diagnosis present

## 2012-06-17 DIAGNOSIS — E871 Hypo-osmolality and hyponatremia: Secondary | ICD-10-CM | POA: Diagnosis present

## 2012-06-17 DIAGNOSIS — I739 Peripheral vascular disease, unspecified: Secondary | ICD-10-CM | POA: Diagnosis present

## 2012-06-17 DIAGNOSIS — I469 Cardiac arrest, cause unspecified: Secondary | ICD-10-CM | POA: Diagnosis present

## 2012-06-17 DIAGNOSIS — G4733 Obstructive sleep apnea (adult) (pediatric): Secondary | ICD-10-CM | POA: Diagnosis present

## 2012-06-17 DIAGNOSIS — E11622 Type 2 diabetes mellitus with other skin ulcer: Secondary | ICD-10-CM | POA: Diagnosis present

## 2012-06-17 DIAGNOSIS — K625 Hemorrhage of anus and rectum: Secondary | ICD-10-CM | POA: Diagnosis present

## 2012-06-17 DIAGNOSIS — K209 Esophagitis, unspecified without bleeding: Secondary | ICD-10-CM | POA: Diagnosis present

## 2012-06-17 DIAGNOSIS — J9600 Acute respiratory failure, unspecified whether with hypoxia or hypercapnia: Secondary | ICD-10-CM | POA: Diagnosis not present

## 2012-06-17 DIAGNOSIS — J9601 Acute respiratory failure with hypoxia: Secondary | ICD-10-CM | POA: Diagnosis present

## 2012-06-17 DIAGNOSIS — L0291 Cutaneous abscess, unspecified: Secondary | ICD-10-CM | POA: Diagnosis present

## 2012-06-17 DIAGNOSIS — L03116 Cellulitis of left lower limb: Secondary | ICD-10-CM | POA: Diagnosis present

## 2012-06-17 DIAGNOSIS — E1139 Type 2 diabetes mellitus with other diabetic ophthalmic complication: Secondary | ICD-10-CM | POA: Diagnosis present

## 2012-06-17 DIAGNOSIS — E1149 Type 2 diabetes mellitus with other diabetic neurological complication: Secondary | ICD-10-CM | POA: Diagnosis present

## 2012-06-17 DIAGNOSIS — M79606 Pain in leg, unspecified: Secondary | ICD-10-CM | POA: Diagnosis present

## 2012-06-17 DIAGNOSIS — L98499 Non-pressure chronic ulcer of skin of other sites with unspecified severity: Secondary | ICD-10-CM | POA: Diagnosis present

## 2012-06-17 DIAGNOSIS — K219 Gastro-esophageal reflux disease without esophagitis: Secondary | ICD-10-CM | POA: Diagnosis present

## 2012-06-17 DIAGNOSIS — IMO0002 Reserved for concepts with insufficient information to code with codable children: Secondary | ICD-10-CM | POA: Diagnosis present

## 2012-06-17 DIAGNOSIS — Z992 Dependence on renal dialysis: Secondary | ICD-10-CM | POA: Diagnosis present

## 2012-06-17 DIAGNOSIS — Z9861 Coronary angioplasty status: Secondary | ICD-10-CM

## 2012-06-17 DIAGNOSIS — F411 Generalized anxiety disorder: Secondary | ICD-10-CM | POA: Diagnosis present

## 2012-06-17 DIAGNOSIS — E872 Acidosis: Secondary | ICD-10-CM | POA: Diagnosis present

## 2012-06-17 DIAGNOSIS — E11319 Type 2 diabetes mellitus with unspecified diabetic retinopathy without macular edema: Secondary | ICD-10-CM | POA: Diagnosis present

## 2012-06-17 DIAGNOSIS — Y841 Kidney dialysis as the cause of abnormal reaction of the patient, or of later complication, without mention of misadventure at the time of the procedure: Secondary | ICD-10-CM | POA: Diagnosis present

## 2012-06-17 DIAGNOSIS — L8990 Pressure ulcer of unspecified site, unspecified stage: Secondary | ICD-10-CM | POA: Diagnosis present

## 2012-06-17 DIAGNOSIS — E1169 Type 2 diabetes mellitus with other specified complication: Secondary | ICD-10-CM | POA: Diagnosis present

## 2012-06-17 DIAGNOSIS — N2581 Secondary hyperparathyroidism of renal origin: Secondary | ICD-10-CM | POA: Diagnosis present

## 2012-06-17 DIAGNOSIS — R1310 Dysphagia, unspecified: Secondary | ICD-10-CM | POA: Diagnosis present

## 2012-06-17 DIAGNOSIS — G589 Mononeuropathy, unspecified: Secondary | ICD-10-CM | POA: Diagnosis present

## 2012-06-17 DIAGNOSIS — Z5309 Procedure and treatment not carried out because of other contraindication: Secondary | ICD-10-CM | POA: Insufficient documentation

## 2012-06-17 DIAGNOSIS — F17200 Nicotine dependence, unspecified, uncomplicated: Secondary | ICD-10-CM | POA: Diagnosis present

## 2012-06-17 DIAGNOSIS — T82598A Other mechanical complication of other cardiac and vascular devices and implants, initial encounter: Secondary | ICD-10-CM | POA: Diagnosis present

## 2012-06-17 DIAGNOSIS — F419 Anxiety disorder, unspecified: Secondary | ICD-10-CM | POA: Diagnosis present

## 2012-06-17 DIAGNOSIS — E875 Hyperkalemia: Secondary | ICD-10-CM | POA: Diagnosis present

## 2012-06-17 DIAGNOSIS — R578 Other shock: Secondary | ICD-10-CM | POA: Diagnosis not present

## 2012-06-17 DIAGNOSIS — Z961 Presence of intraocular lens: Secondary | ICD-10-CM

## 2012-06-17 LAB — CBC WITH DIFFERENTIAL/PLATELET
Basophils Absolute: 0.1 10*3/uL (ref 0.0–0.1)
Eosinophils Relative: 0 % (ref 0–5)
Lymphocytes Relative: 3 % — ABNORMAL LOW (ref 12–46)
Lymphs Abs: 0.7 10*3/uL (ref 0.7–4.0)
Neutro Abs: 19.9 10*3/uL — ABNORMAL HIGH (ref 1.7–7.7)
Neutrophils Relative %: 95 % — ABNORMAL HIGH (ref 43–77)
Platelets: 536 10*3/uL — ABNORMAL HIGH (ref 150–400)
RBC: 3.28 MIL/uL — ABNORMAL LOW (ref 3.87–5.11)
RDW: 21 % — ABNORMAL HIGH (ref 11.5–15.5)
WBC: 20.9 10*3/uL — ABNORMAL HIGH (ref 4.0–10.5)

## 2012-06-17 LAB — CBC
HCT: 32 % — ABNORMAL LOW (ref 36.0–46.0)
Hemoglobin: 9.5 g/dL — ABNORMAL LOW (ref 12.0–15.0)
MCH: 29.9 pg (ref 26.0–34.0)
MCH: 29.9 pg (ref 26.0–34.0)
MCHC: 30.9 g/dL (ref 30.0–36.0)
MCHC: 30.9 g/dL (ref 30.0–36.0)
RDW: 20.7 % — ABNORMAL HIGH (ref 11.5–15.5)
RDW: 20.6 % — ABNORMAL HIGH (ref 11.5–15.5)

## 2012-06-17 LAB — GLUCOSE, CAPILLARY
Glucose-Capillary: 555 mg/dL (ref 70–99)
Glucose-Capillary: 470 mg/dL — ABNORMAL HIGH (ref 70–99)
Glucose-Capillary: 445 mg/dL — ABNORMAL HIGH (ref 70–99)
Glucose-Capillary: 302 mg/dL — ABNORMAL HIGH (ref 70–99)
Glucose-Capillary: 366 mg/dL — ABNORMAL HIGH (ref 70–99)
Glucose-Capillary: 268 mg/dL — ABNORMAL HIGH (ref 70–99)
Glucose-Capillary: 210 mg/dL — ABNORMAL HIGH (ref 70–99)

## 2012-06-17 LAB — BASIC METABOLIC PANEL
BUN: 89 mg/dL — ABNORMAL HIGH (ref 6–23)
CO2: 20 mEq/L (ref 19–32)
Calcium: 9.6 mg/dL (ref 8.4–10.5)
Calcium: 9.8 mg/dL (ref 8.4–10.5)
Calcium: 9.8 mg/dL (ref 8.4–10.5)
Creatinine, Ser: 5 mg/dL — ABNORMAL HIGH (ref 0.50–1.10)
Creatinine, Ser: 5.53 mg/dL — ABNORMAL HIGH (ref 0.50–1.10)
GFR calc Af Amer: 11 mL/min — ABNORMAL LOW (ref >90)
GFR calc Af Amer: 11 mL/min — ABNORMAL LOW (ref >90)
GFR calc non Af Amer: 10 mL/min — ABNORMAL LOW (ref >90)
GFR calc non Af Amer: 9 mL/min — ABNORMAL LOW (ref >90)
GFR calc non Af Amer: 9 mL/min — ABNORMAL LOW (ref >90)
Glucose, Bld: 409 mg/dL — ABNORMAL HIGH (ref 70–99)
Glucose, Bld: 210 mg/dL — ABNORMAL HIGH (ref 70–99)
Potassium: 5.4 mEq/L — ABNORMAL HIGH (ref 3.5–5.1)
Sodium: 125 mEq/L — ABNORMAL LOW (ref 135–145)
Sodium: 127 mEq/L — ABNORMAL LOW (ref 135–145)
Sodium: 129 mEq/L — ABNORMAL LOW (ref 135–145)

## 2012-06-17 LAB — LACTIC ACID, PLASMA: Lactic Acid, Venous: 1.3 mmol/L (ref 0.5–2.2)

## 2012-06-17 LAB — PRO B NATRIURETIC PEPTIDE: Pro B Natriuretic peptide (BNP): 16777 pg/mL — ABNORMAL HIGH (ref 0–125)

## 2012-06-17 LAB — MRSA PCR SCREENING: MRSA by PCR: NEGATIVE

## 2012-06-17 MED ORDER — OFLOXACIN 0.3 % OP SOLN
1.0000 [drp] | Freq: Three times a day (TID) | OPHTHALMIC | Status: DC
  Administered 2012-06-17 – 2012-06-28 (×27): 1 [drp] via OPHTHALMIC
  Filled 2012-06-17 (×2): qty 5

## 2012-06-17 MED ORDER — NICOTINE 21 MG/24HR TD PT24
42.0000 mg | MEDICATED_PATCH | Freq: Every day | TRANSDERMAL | Status: DC
  Administered 2012-06-17 – 2012-06-18 (×2): 42 mg via TRANSDERMAL
  Filled 2012-06-17 (×3): qty 2

## 2012-06-17 MED ORDER — METOCLOPRAMIDE HCL 10 MG PO TABS
10.0000 mg | ORAL_TABLET | Freq: Four times a day (QID) | ORAL | Status: DC
  Administered 2012-06-18 (×2): 10 mg via ORAL
  Filled 2012-06-17 (×10): qty 1

## 2012-06-17 MED ORDER — SODIUM POLYSTYRENE SULFONATE 15 GM/60ML PO SUSP: 30.0000 g | Freq: Once | ORAL | Status: DC

## 2012-06-17 MED ORDER — ALUM & MAG HYDROXIDE-SIMETH 200-200-20 MG/5ML PO SUSP
30.0000 mL | Freq: Four times a day (QID) | ORAL | Status: DC | PRN
  Filled 2012-06-17: qty 30

## 2012-06-17 MED ORDER — PROMETHAZINE HCL 25 MG PO TABS
25.0000 mg | ORAL_TABLET | Freq: Four times a day (QID) | ORAL | Status: DC | PRN
  Administered 2012-06-18 (×2): 25 mg via ORAL
  Filled 2012-06-17 (×3): qty 1

## 2012-06-17 MED ORDER — PENTAFLUOROPROP-TETRAFLUOROETH EX AERO: 1.0000 "application " | INHALATION_SPRAY | CUTANEOUS | Status: DC | PRN

## 2012-06-17 MED ORDER — MUPIROCIN 2 % EX OINT
1.0000 "application " | TOPICAL_OINTMENT | Freq: Every day | CUTANEOUS | Status: DC
  Administered 2012-06-17 – 2012-06-18 (×2): 1 via TOPICAL
  Filled 2012-06-17: qty 22

## 2012-06-17 MED ORDER — RENA-VITE PO TABS
1.0000 | ORAL_TABLET | Freq: Every day | ORAL | Status: DC
  Filled 2012-06-17 (×3): qty 1

## 2012-06-17 MED ORDER — LIDOCAINE HCL (PF) 1 % IJ SOLN: 5.0000 mL | INTRAMUSCULAR | Status: DC | PRN

## 2012-06-17 MED ORDER — ALBUTEROL SULFATE HFA 108 (90 BASE) MCG/ACT IN AERS
2.0000 | INHALATION_SPRAY | Freq: Four times a day (QID) | RESPIRATORY_TRACT | Status: DC | PRN
  Filled 2012-06-17: qty 6.7

## 2012-06-17 MED ORDER — OXYCODONE-ACETAMINOPHEN 5-325 MG PO TABS
1.0000 | ORAL_TABLET | ORAL | Status: DC | PRN
  Administered 2012-06-18 – 2012-06-19 (×5): 1 via ORAL
  Filled 2012-06-17 (×5): qty 1

## 2012-06-17 MED ORDER — LIDOCAINE-PRILOCAINE 2.5-2.5 % EX CREA
1.0000 "application " | TOPICAL_CREAM | CUTANEOUS | Status: DC | PRN
  Filled 2012-06-17: qty 5

## 2012-06-17 MED ORDER — DARBEPOETIN ALFA-POLYSORBATE 150 MCG/0.3ML IJ SOLN
150.0000 ug | INTRAMUSCULAR | Status: DC
  Filled 2012-06-17 (×2): qty 0.3

## 2012-06-17 MED ORDER — CINACALCET HCL 30 MG PO TABS
30.0000 mg | ORAL_TABLET | Freq: Every day | ORAL | Status: DC
  Filled 2012-06-17 (×3): qty 1

## 2012-06-17 MED ORDER — NEPRO/CARBSTEADY PO LIQD
237.0000 mL | ORAL | Status: DC | PRN
  Filled 2012-06-17: qty 237

## 2012-06-17 MED ORDER — SEVELAMER CARBONATE 800 MG PO TABS
4000.0000 mg | ORAL_TABLET | Freq: Three times a day (TID) | ORAL | Status: DC
  Filled 2012-06-17 (×7): qty 5

## 2012-06-17 MED ORDER — SODIUM CHLORIDE 0.9 % IJ SOLN
3.0000 mL | Freq: Two times a day (BID) | INTRAMUSCULAR | Status: DC
  Administered 2012-06-18 – 2012-06-19 (×2): 3 mL via INTRAVENOUS
  Administered 2012-06-20: 20 mL via INTRAVENOUS
  Administered 2012-06-22: 10 mL via INTRAVENOUS
  Administered 2012-06-22: 3 mL via INTRAVENOUS
  Administered 2012-06-23: 10 mL via INTRAVENOUS
  Administered 2012-06-23: 3 mL via INTRAVENOUS
  Administered 2012-06-24: 10 mL via INTRAVENOUS
  Administered 2012-06-25: 10:00:00 via INTRAVENOUS

## 2012-06-17 MED ORDER — SODIUM CHLORIDE 0.9 % IV SOLN: 100.0000 mL | INTRAVENOUS | Status: DC | PRN

## 2012-06-17 MED ORDER — DEXTROSE 50 % IV SOLN: 25.0000 mL | INTRAVENOUS | Status: DC | PRN

## 2012-06-17 MED ORDER — DEXTROSE-NACL 5-0.45 % IV SOLN: INTRAVENOUS | Status: DC

## 2012-06-17 MED ORDER — METHOCARBAMOL 500 MG PO TABS
500.0000 mg | ORAL_TABLET | Freq: Three times a day (TID) | ORAL | Status: DC | PRN
  Filled 2012-06-17: qty 1

## 2012-06-17 MED ORDER — ALPRAZOLAM 0.5 MG PO TABS
0.5000 mg | ORAL_TABLET | Freq: Three times a day (TID) | ORAL | Status: DC | PRN
  Administered 2012-06-18: 0.5 mg via ORAL
  Filled 2012-06-17: qty 1

## 2012-06-17 MED ORDER — ACETAMINOPHEN 650 MG RE SUPP: 650.0000 mg | Freq: Four times a day (QID) | RECTAL | Status: DC | PRN

## 2012-06-17 MED ORDER — PAROXETINE HCL 30 MG PO TABS
80.0000 mg | ORAL_TABLET | ORAL | Status: DC
  Filled 2012-06-17 (×3): qty 1

## 2012-06-17 MED ORDER — INSULIN REGULAR BOLUS VIA INFUSION
0.0000 [IU] | Freq: Three times a day (TID) | INTRAVENOUS | Status: DC
  Filled 2012-06-17: qty 10

## 2012-06-17 MED ORDER — POTASSIUM CHLORIDE CRYS ER 20 MEQ PO TBCR: 20.0000 meq | EXTENDED_RELEASE_TABLET | ORAL | Status: DC | PRN

## 2012-06-17 MED ORDER — DOCUSATE SODIUM 100 MG PO CAPS
100.0000 mg | ORAL_CAPSULE | Freq: Two times a day (BID) | ORAL | Status: DC
  Filled 2012-06-17 (×4): qty 1

## 2012-06-17 MED ORDER — SODIUM CHLORIDE 0.9 % IV SOLN: INTRAVENOUS | Status: DC

## 2012-06-17 MED ORDER — SODIUM CHLORIDE 0.9 % IV SOLN
INTRAVENOUS | Status: DC
  Administered 2012-06-17: 4.1 [IU]/h via INTRAVENOUS
  Administered 2012-06-17: 3.1 [IU]/h via INTRAVENOUS
  Filled 2012-06-17 (×2): qty 1

## 2012-06-17 MED ORDER — SEVELAMER CARBONATE 800 MG PO TABS
1600.0000 mg | ORAL_TABLET | Freq: Two times a day (BID) | ORAL | Status: DC
  Filled 2012-06-17 (×4): qty 2

## 2012-06-17 MED ORDER — HEPARIN SODIUM (PORCINE) 1000 UNIT/ML DIALYSIS
100.0000 [IU]/kg | INTRAMUSCULAR | Status: DC | PRN
  Filled 2012-06-17: qty 9

## 2012-06-17 MED ORDER — HEPARIN SODIUM (PORCINE) 1000 UNIT/ML DIALYSIS
1000.0000 [IU] | INTRAMUSCULAR | Status: DC | PRN
  Filled 2012-06-17: qty 1

## 2012-06-17 MED ORDER — ACETAMINOPHEN 325 MG PO TABS: 650.0000 mg | ORAL_TABLET | Freq: Four times a day (QID) | ORAL | Status: DC | PRN

## 2012-06-17 MED ORDER — INSULIN ASPART 100 UNIT/ML ~~LOC~~ SOLN: 10.0000 [IU] | Freq: Once | SUBCUTANEOUS | Status: DC

## 2012-06-17 MED ORDER — ARIPIPRAZOLE 10 MG PO TABS
10.0000 mg | ORAL_TABLET | Freq: Every day | ORAL | Status: DC
  Filled 2012-06-17 (×3): qty 1

## 2012-06-17 MED ORDER — INSULIN ASPART 100 UNIT/ML ~~LOC~~ SOLN
10.0000 [IU] | Freq: Once | SUBCUTANEOUS | Status: DC
  Filled 2012-06-17: qty 1

## 2012-06-17 MED ORDER — ALTEPLASE 2 MG IJ SOLR
2.0000 mg | Freq: Once | INTRAMUSCULAR | Status: AC | PRN
  Filled 2012-06-17: qty 2

## 2012-06-17 MED ORDER — HEPARIN SODIUM (PORCINE) 5000 UNIT/ML IJ SOLN
5000.0000 [IU] | Freq: Three times a day (TID) | INTRAMUSCULAR | Status: DC
  Administered 2012-06-17 – 2012-06-19 (×5): 5000 [IU] via SUBCUTANEOUS
  Filled 2012-06-17 (×8): qty 1

## 2012-06-17 MED ORDER — HYDRALAZINE HCL 20 MG/ML IJ SOLN
5.0000 mg | INTRAMUSCULAR | Status: DC | PRN
  Filled 2012-06-17: qty 0.5

## 2012-06-17 MED ORDER — AMOXICILLIN-POT CLAVULANATE 400-57 MG/5ML PO SUSR
400.0000 mg | Freq: Every day | ORAL | Status: DC
  Filled 2012-06-17 (×4): qty 5

## 2012-06-17 MED ORDER — VITAMIN B-6 100 MG PO TABS
100.0000 mg | ORAL_TABLET | Freq: Every day | ORAL | Status: DC
  Filled 2012-06-17 (×3): qty 1

## 2012-06-17 MED ORDER — PANTOPRAZOLE SODIUM 40 MG PO TBEC: 80.0000 mg | DELAYED_RELEASE_TABLET | Freq: Every day | ORAL | Status: DC

## 2012-06-17 MED ORDER — INSULIN ASPART 100 UNIT/ML ~~LOC~~ SOLN
10.0000 [IU] | Freq: Once | SUBCUTANEOUS | Status: AC
  Administered 2012-06-17: 10 [IU] via INTRAVENOUS

## 2012-06-17 MED ORDER — BACITRACIN-POLYMYXIN B 500-10000 UNIT/GM OP OINT
TOPICAL_OINTMENT | Freq: Every day | OPHTHALMIC | Status: DC
  Administered 2012-06-17: 22:00:00 via OPHTHALMIC
  Administered 2012-06-18: 1 via OPHTHALMIC
  Administered 2012-06-19 – 2012-06-21 (×3): via OPHTHALMIC
  Administered 2012-06-22: 1 via OPHTHALMIC
  Administered 2012-06-23 – 2012-06-25 (×2): via OPHTHALMIC
  Administered 2012-06-25: 1 via OPHTHALMIC
  Administered 2012-06-26 – 2012-06-27 (×2): via OPHTHALMIC
  Filled 2012-06-17 (×3): qty 3.5

## 2012-06-17 MED ORDER — FLUTICASONE PROPIONATE 50 MCG/ACT NA SUSP
1.0000 | Freq: Every day | NASAL | Status: DC
  Administered 2012-06-18: 1 via NASAL
  Filled 2012-06-17: qty 16

## 2012-06-17 MED ORDER — SODIUM CHLORIDE 0.9 % IV BOLUS (SEPSIS)
1000.0000 mL | Freq: Once | INTRAVENOUS | Status: AC
  Administered 2012-06-17: 1000 mL via INTRAVENOUS

## 2012-06-17 MED ORDER — CLOPIDOGREL BISULFATE 75 MG PO TABS
75.0000 mg | ORAL_TABLET | Freq: Every day | ORAL | Status: DC
  Filled 2012-06-17 (×3): qty 1

## 2012-06-17 NOTE — ED Notes (Signed)
Sarah, RN trying to access IV.

## 2012-06-17 NOTE — ED Notes (Signed)
Glucose 412 mg/dl

## 2012-06-17 NOTE — ED Notes (Signed)
Patient's sister at bedside with patient.

## 2012-06-17 NOTE — ED Notes (Signed)
Care transferred and report given to [facility], [place].

## 2012-06-17 NOTE — ED Notes (Signed)
Called IV team to come for access.

## 2012-06-17 NOTE — ED Notes (Signed)
Glucose 470 mg/dl

## 2012-06-17 NOTE — ED Notes (Signed)
IV start unsuccessful x 1

## 2012-06-17 NOTE — ED Notes (Signed)
Lupita Leash (sister) [phone]

## 2012-06-17 NOTE — ED Notes (Signed)
Pt resting quietly on stretcher with family at bedside; pt noted to have snoring respirations, o2 sats interm dropping to 88% RA; pt interm having 15 sec periods of apnea; however, sats remain in 90s;placed on 3Lo2/[place]; pt routinely on Percocet; discussed with attending possibility of Narcan - states to defer Narcan at this time; MD states to notify him if sats drop below 88%; pt arousable to loud voice and tactile stimuli; however, immediately goes back to sleep

## 2012-06-17 NOTE — ED Notes (Addendum)
Pt had mod amount of loose light brown stool in panties; pericare done; clothing removed - placed in hospital gown; bed linens changed; multiple areas of new and old circular wounds noted to bilat buttocks - wounds to rgt buttock scabbed over; single wound to left buttock open - appears to be tunneling - yellowish white drainage noted; assisted to supine position - HOB elevated 45 degrees - pt appears comfortable

## 2012-06-17 NOTE — ED Notes (Signed)
Oxygen via [place] stopped at this time per V.O. Of admit team - states they want to get a RA ABG 10 min after oxygen is stopped

## 2012-06-17 NOTE — ED Notes (Signed)
Blood glucose 445 mg/dl - insulin drip adjusted - see MAR

## 2012-06-17 NOTE — H&P (Signed)
Family Medicine Teaching [facility] Admission History and Physical Service Pager: [phone]  Patient name: [name] Medical record number: [id number] Date of birth: 10/08/1970 Age: 41 y.o. Gender: female  Primary Care Provider: Ernestine Conrad, MD  Chief Complaint: hyperglycemia  Assessment and Plan: [name] is a 41 y.o. year old female with a complicated PMH significant for multiple vascular procedures, generally poorly controlled DM type II also on insulin, ESRD on HD ([facility] in [place], [place]), CAD/previous MI s/p stenting, presenting with elevated blood sugars after coming to the hospital for planned HD catheter change and pre-treatment with prednisone for contrast allergy. Found to be in DKA with CBG >600, anion gap 22. WBC 20.9, BP ~180's-190's/90's-100's. Admitting to FPTS 10/22.  ED course: Pt treated with 1L NS bolus and Novolog 10 units, then started on insulin drip. No IVF for hyperglycemia due to  and Kayexalate 30 g ordered per discussion with Dr. Caryn Section (nephrology). Pt also with occasional desats to 85% due to what appears to be OSA, improved with 3L [place].  1. DKA/DM type II with multiple complications - Poor compliance with outpt insulin/CBG regimen. Presenting with CBG >600 and gap 22, as above. Pt on Lantus 33 units qHS, Humalog SSI TID at home, with Reglan for gastroparesis. Last A1c 6.9 in July. Multiple chronic poorly-healing wounds: right LE ulcers, groin incisions from previous bypass surgery, multiple buttocks ulcers. Plan - Insulin drip protocol for now and NPO, to transition to subQ insulin and carb-modified diet as appropriate. Will recheck BMP at 2100. Unfortunately due to ESRD, unable to hydrate patient to assist with DKA treatment.   #Apparent Somnolence-sleeps/snores thorugh majority of time in ED. Easily awakens. History of OSA but adamantly refuses CPAP. co2 monitored in ED and not elevated as expected. Patient is at high risk for becoming hypercarbic so will hold  off on medications that could cause respiratory depression or smooth muscle relaxation  By reducing home percocet and xanax.   2. ESRD on HD - On [facility]ThS schedule in [place], [place]. Baseline Cr appears to be ~3.5-5. Hx of multiple graft/catheter placements. Most recently/currently has right IJ catheter, but was to be changed out today; did not go to dialysis today due to scheduled change of catheter, planned to go tomorrow. Some fluid overload on exam with 2+ pitting edema of bilateral lower legs; some crackles on lung exam but likely component of dependence as pt favors lying on her right side. Plan - Continue home renal medications. Renal service consulted (Dr. Caryn Section). Assistance and recommendation is appreciated. Will need HD tomorrow.   3. HTN - Pt hypertensive in the ED to 200's/100's. Per pt's sister, pt has been taken off "a few different drugs" earlier this year by a doctor during one hospital admission. During previous admission to our service 10/10, pt was intially hypotensive and then normotensive after gentle hydration and subsequent dialysis, not requiring medication. Plan - Will order hydralazine PRN to keep systolic <160, diastolic <100. Will review pt's chart and discuss on formal rounds tomorrow which/what agents to start/restart, if BP remains elevated. Will hold on being more aggressive this evening due to early hypotension at last hospitalization.  4. Cellulitis of LLE - Healed/healing from previous admission with course of abx not yet complete, also with likely component of chronic venous stasis. Nearly circumferential from ankle almost to knee but does not appear as extensive as during previous admission. Plan - Will continue vancomycin (pharmacy consult) and Augmentin (pt has taken 5 doses prior to  admission). Per Dr. Myra Gianotti, pt would likely benefit from elevation/compression. Will discuss whether to continue abx and/or when to stop on formal AM rounds and with nephrology input, as well as  compression; will consider Unna boot. Will need antibiotics continued at least through 10/24.  5. Chronic ulcers/current buttock ulcers - Present on admission; likely a component of pressure/compression given amount of time pt spends in bed/wheelchair, uncertain staging of buttock ulcers.  Plan - Local wound care. Will request wound care consult. Also ordered turn patient q2 hours.   6. CAD/PAD with history of MI - S/p stent placement in 2003; cath in 2009 showed some occlusion of stent with angioplasty and subsequent medically managed restenosis. Aortobifemoral graft and above-the-knee femoral-popliteal bypass on the right leg in May 2013. Echo in 01/2012 showed EF of 60-65% with normal systolic function and mild LVH.  Plan - Continue home Plavix. [facility] aware of admission (Dr. Myra Gianotti, pt's regular vascular surgeon). Will follow-up any recommendations, though pt does not have any current active vascular problems.  7. Bipolar disorder/anxiety/chronic pain - Continue home Paxil, Abilify. Xanax PRN (half home dose). Percocet PRN (half home dose).   8. GERD - Protonix to replace home omeprazole. Maalox PRN.  9. Hx multiple eye surgeries - Pt with Continue home eyedrops (Polysporin, ofloxacin).   10. Hx of migraines - No current headaches. Fioricet PRN to prevent withdrawal from barbiturate.   11. Seasonal allergies - Continue home Flonase.   FEN/GI: NPO for now. Saline lock IV. Prophylaxis/PRN: SubQ heparin. Phenergan. Disposition: Admit to FPTS, step-down unit. Discharge pending clinical improvement. SNF placement as discussed during the previous admission, due to pt's multiple comorbidities and occasional missing of HD appointments, but pt and family preferred to take pt home with home health. Will request PT/OT and CSW consults to readdress. Code Status: Full  History of Present Illness: [name] is a 41 y.o. year old female with a complicated PMH significant for multiple vascular  procedures, generally poorly controlled DM type II also on insulin, ESRD on HD ([facility] in [place], [place]), CAD/previous MI s/p stenting, presenting with elevated blood sugars. [name] provides most of the history; pt is somewhat somnolent/lethargic, is arousable and answers questions, but often/quickly appears to fall back to sleep or ignores questions and lets sister answer for her. Pt came to radiology today for a hemodialysis catheter change; per sister, pt's last dialysis session had to be slowed because the catheter "started to clot," and she came today to get it changed. Pt is allergic to contrast media and was given prednisone to take last night and this morning, prior to the procedure. Pt presented to radiology today and was noted to have CBG >600 and was brought to the ED by the radiology nurse.  Pt is also hypertensive in the ED. Per sister, pt was taken off her "old meds, maybe three pills," by an "internal doctor, back earlier this year." Pt not currently on any BP-related meds at home other than Lasix.  Pt lives with mother and sister at home. Still an extensive every-day smoker. Sister and mother care for pt; per sister, pt is not very compliant with medications always, particularly insulin. Pt allows sugar to be checked maybe twice per day, and takes sliding-scale Novolog when sugars are high. She takes Lantus 33 units at bedtime. She also has gastroparesis and sister states, "sometimes she wakes up in the morning and doesn't feel like eating, sometimes she'll say she's hungry but is sick to her stomach  and vomits before the food is ready." Per sister, nausea/vomiting are "almost-everyday" symptoms for the pt chronically. She denies chest pain or abdominal pain and has "no more SOB than normal." Denies fevers/chills at home but "sometimes gets cold." Pt has chronic ulcers: healing/healed ulcers on her right leg, and ulcers that "come and go" on her buttocks which are very painful; pt spends most of  her day in her wheelchair and often sleeps in a lift chair or her wheelchair rather than her bed.  Of note, pt was recently admitted to our service with LLE cellulitis which appears to be in the process of healing. Per chart review, pt was discharged with Augmentin and vancomycin at hemodialysis appointments, to be continued until at least 10/24. Per sister/chart review, pt has been seen at [facility] outpt and has been instructed to continue compression of her left lower leg at home, with which she has been marginally compliant. Pt was also discharged with [facility], but pt has not really participated in much therapy due to being told, per sister, "you shouldn't be up on that leg with the cellulitis still healing."  Patient Active Problem List  Diagnosis  . CAD, NATIVE VESSEL  . DVT  . Diabetic gastroparesis  . Claudication  . Carotid bruit  . ESRD on dialysis  . DM (diabetes mellitus), type 2 with complications  . Ulcers of both lower extremities  . PVD (peripheral vascular disease)  . Tobacco abuse  . Hyperparathyroidism, secondary renal  . Bipolar 1 disorder  . Normocytic anemia  . Diabetic leg ulcer  . Circulation problem  . Carotid artery stenosis, asymptomatic  . Atherosclerosis of native arteries of the extremities with intermittent claudication  . Atherosclerosis of native arteries of the extremities with ulceration  . SVC syndrome  . Left leg cellulitis  . Sepsis  . Cellulitis and abscess of unspecified site   Past Medical History: Past Medical History  Diagnosis Date  . DVT (deep venous thrombosis)     DVT HISTORY  . Diabetes mellitus   . Migraine     HISTORY  . Anxiety and depression   . CAD (coronary artery disease)     Last cath 2009:  LAD stent 80% stenosis. Circumflex 60-70% stenosis AV groove, right coronary artery 50-60% stenosis. She did have cutting balloon angioplasty of the LAD lesion into a diagonal. However, there was restenosis of this and it was managed  medically.  . Bipolar 1 disorder   . Hyperlipidemia   . Hyperparathyroidism   . PE (pulmonary embolism)     HISTORY  . Dialysis patient   . Anemia   . Renal failure     dialysis [place] t,th sat  . Nephrotic syndrome   . Peripheral vascular disease   . MRSA infection   . Anxiety   . Depression   . Gastroparesis   . Complication of anesthesia   . PONV (postoperative nausea and vomiting)     x3 days post anesth. on 12/19/2011  . Myocardial infarction     x3 , last one - 10 yrs. ago  . Hypertension     sees Dr. Antoine Poche - [place], last stress test 11/2011, see result in EPIC, saw Dr. Antoine Poche as well at that time    . Shortness of breath   . Recurrent upper respiratory infection (URI)     Bronchitis 11/2011 - saw Dr. Loney Hering, treating currently /w antibiotic   . GERD (gastroesophageal reflux disease)   . Pneumonia  [facility]- 2012  . Noncompliance of patient with renal dialysis   . Cellulitis Oct. 2013  . MRSA (methicillin resistant staph aureus) culture positive Oct 2013   Past Surgical History: Past Surgical History  Procedure Date  . Incise and drain abcess     OF THIGHS FROM INSULIN INJECTIONS  . Heart stents   . Portacath placement 2003  . Cataract extraction w/phaco 11/09/2011    Procedure: CATARACT EXTRACTION PHACO AND INTRAOCULAR LENS PLACEMENT (IOC);  Surgeon: Edmon Crape, MD;  Location: [facility] OR;  Service: Ophthalmology;  Laterality: Right;  . Pars plana vitrectomy 11/09/2011    Procedure: PARS PLANA VITRECTOMY WITH 23 GAUGE;  Surgeon: Edmon Crape, MD;  Location: [facility] OR;  Service: Ophthalmology;  Laterality: Right;  Ahmed Valve; Scleral Reinforcement Graft  . Dg av dialysis  shunt access exist*l* or 12/13/11    left arm  . Dialysis cath inserted & portacath      dialysis catheter inserted & portacath d/c'd- 12/19/2011  . Back surgery X 4    x4 times   . Cholecystectomy   . Eye surgery   . Cardiac catheterization   . Aorta - bilateral femoral artery bypass graft 12/28/2011      Procedure: AORTA BIFEMORAL BYPASS GRAFT;  Surgeon: Nada Libman, MD;  Location: [facility] OR;  Service: Vascular;  Laterality: N/A;  AortoBifemoral Bypass using hemasheild 14x7 graft  . Femoral-popliteal bypass graft 12/28/2011    Procedure: BYPASS GRAFT FEMORAL-POPLITEAL ARTERY;  Surgeon: Nada Libman, MD;  Location: [facility] OR;  Service: Vascular;  Laterality: Right;  right femoral popliteal bypass using 6x80 propaten graft  . Insertion of dialysis catheter 02/15/2012    Procedure: INSERTION OF DIALYSIS CATHETER;  Surgeon: Chuck Hint, MD;  Location: [facility] OR;  Service: Vascular;  Laterality: Right;  insertion of dialysis catheter right internal jugular vein,removal of left internal jugular vein dialysis catheter  . Insertion of dialysis catheter 02/21/2012    Procedure: INSERTION OF DIALYSIS CATHETER;  Surgeon: Chuck Hint, MD;  Location[facility] OR;  Service: Cardiovascular;  Laterality: N/A;  Insertion new dialysis catheter; possible venoplasty of fibrin sheath of  Right IJ catheter  . Venogram 02/21/2012    Procedure: VENOGRAM;  Surgeon: Chuck Hint, MD;  Location: [facility] OR;  Service: Cardiovascular;  Laterality: N/A;  possible venoplasty  . Insertion of dialysis catheter 04/23/2012    Procedure: INSERTION OF DIALYSIS CATHETER;  Surgeon: Sherren Kerns, MD;  Location: [facility] OR;  Service: Vascular;  Laterality: Right;  using a 55fr. x 23 cannon catheter in Right Internal Jugular   Social History: History  Substance Use Topics  . Smoking status: Current Every Day Smoker -- 3.0 packs/day for 20 years    Types: Cigarettes  . Smokeless tobacco: Never Used   Comment: pt states she is not ready to quit   . Alcohol Use: No   For any additional social history documentation, please refer to relevant sections of EMR.  Family History: Family History  Problem Relation Age of Onset  . Transient ischemic attack Mother   . Hepatitis Sister   . Diabetes type II Other   . Anesthesia  problems Neg Hx   . Hypotension Neg Hx   . Malignant hyperthermia Neg Hx   . Pseudochol deficiency Neg Hx    Allergies: Allergies  Allergen Reactions  . Compazine (Prochlorperazine) Swelling    Per echart records (takes promethazine at home)  . Contrast Media (Iodinated Diagnostic Agents) Swelling    Per  echart records  . Meperidine Hcl Swelling  . Metformin And Related Other (See Comments)    'just not myself, was told not to take metformin'  . Morphine Swelling  . Nubain (Nalbuphine Hcl) Swelling  . Tobramycin   . Topiramate (Topamax) Swelling    Tongue swelling - per echart records  . Toradol (Ketorolac Tromethamine) Swelling   No current facility-administered medications on file prior to encounter.   Current Outpatient Prescriptions on File Prior to Encounter  Medication Sig Dispense Refill  . albuterol (PROVENTIL HFA;VENTOLIN HFA) 108 (90 BASE) MCG/ACT inhaler Inhale 2 puffs into the lungs every 6 (six) hours as needed. Shortness of breath      . ALPRAZolam (XANAX) 1 MG tablet Take 1 tablet (1 mg total) by mouth 3 (three) times daily as needed. For anxiety  60 tablet  0  . amoxicillin-clavulanate (AUGMENTIN) 400-57 MG/5ML suspension Take 5 mLs (400 mg total) by mouth daily. Take after dialysis on Tuesdays, Thursdays, and Saturdays.  50 mL  0  . ARIPiprazole (ABILIFY) 2 MG tablet Take 10 mg by mouth daily.       Marland Kitchen b complex-vitamin c-folic acid (NEPHRO-VITE) 0.8 MG TABS Take 0.8 mg by mouth at bedtime.      . bacitracin ophthalmic ointment Place 1 application into both eyes at bedtime. apply to eye      . butalbital-acetaminophen-caffeine (FIORICET, ESGIC) 50-325-40 MG per tablet Take 1 tablet by mouth 3 (three) times daily after meals. For headaches  45 tablet  0  . cinacalcet (SENSIPAR) 30 MG tablet Take 30 mg by mouth daily with supper.       . clopidogrel (PLAVIX) 75 MG tablet Take 75 mg by mouth daily. Last dose 12/17/2011      . collagenase (SANTYL) ointment Apply 1  application topically daily.      . darbepoetin (ARANESP) 60 MCG/0.3ML SOLN Inject 60 mcg into the vein every Saturday with hemodialysis.      . fluticasone (FLONASE) 50 MCG/ACT nasal spray Place 1 spray into the nose.       . furosemide (LASIX) 40 MG tablet Take 40 mg by mouth daily.       . insulin glargine (LANTUS) 100 UNIT/ML injection Inject 33 Units into the skin at bedtime.      . insulin lispro (HUMALOG) 100 UNIT/ML injection Inject 7-15 Units into the skin 4 (four) times daily -  before meals and at bedtime. TAKING ON SLIDING SCALE 200-249=7 units 250-299=10 units 300-349=12 units >350=15 units      . methocarbamol (ROBAXIN) 500 MG tablet Take 500 mg by mouth 2 (two) times daily.       . metoCLOPramide (REGLAN) 10 MG tablet Take 10 mg by mouth 4 (four) times daily.      Marland Kitchen ofloxacin (OCUFLOX) 0.3 % ophthalmic solution Place 1 drop into both eyes 3 (three) times daily.       Marland Kitchen omeprazole (PRILOSEC) 40 MG capsule Take 40 mg by mouth daily.       Marland Kitchen oxyCODONE-acetaminophen (PERCOCET) 10-325 MG per tablet Take 1 tablet by mouth every 4 (four) hours as needed. For pain      . PARoxetine (PAXIL) 40 MG tablet Take 80 mg by mouth every morning.       . promethazine (PHENERGAN) 25 MG tablet Take 25 mg by mouth every 6 (six) hours as needed. For nausea      . pyridoxine (B-6) 100 MG tablet Take 100 mg by mouth daily.      Marland Kitchen  sevelamer (RENVELA) 800 MG tablet Take 1,600-4,000 mg by mouth 5 (five) times daily. *Take 5 tablets daily with each meal and take 2 tablets with snacks**      . ULTICARE INSULIN SYRINGE 31G X 5/16" 1 ML MISC       . vancomycin (VANCOCIN) 1 GM/200ML SOLN Inject 200 mLs (1,000 mg total) into the vein Every Tuesday,Thursday,and Saturday with dialysis.  4000 mL  0   Review Of Systems: Per HPI.   Physical Exam: BP 201/90  Pulse 101  Resp 24  Ht 5\' 10"  (1.778 m)  Wt 198 lb (89.812 kg)  BMI 28.41 kg/m2  SpO2 93%  LMP 09/03/2008 Exam: General: obese adult female, lying in  bed on right side, responsive but lethargic/somnolent, often indifferent to questions  Often appears to fall asleep and/or ignore questions  Snoring/very loud respirations with occasional periods of apnea/snorting/grunting  sats noted to fluctuate between 87% and 95% when sleeping vs. Awake, not on o2 in room HEENT: [place], AT, EOMI; right eye with injection/slight swelling in the sclera, cornea with cloudy appearance ; slight hoarseness to voice Cardiovascular: RRR, normal S1, S2; heart sounds slightly distant secondary to body habitus Respiratory: CTAB, scant expiratory wheezes; dependent crackles at right base (pt lying on right side) Abdomen: soft, nontender, obese; large midline scar; ~2 cm soft, easily reducible incisional hernia Extremities: marked bilateral 2+ pitting edema from ankles to just above knees Skin: RLE with two areas of chronic ulceration, one just below/lateral to knee, one above ankle on shin  Bilateral relatively poorly healed areas of groin at previous bypass graft sites  Buttock ulcerations, various stages of healing/scabbing   Multiple shallow circular lesions with surrounding erythema, very tender with majority scabbed over   Dressings in place over ulcerations, soiled with fecal material  LLE with redness, slight warmth to the touch and very tender, not markedly more swollen than right   Similar area of redness as on presentation to the ED on 10/10, but not as dark  Significant scaling of skin of LLE and foot Neuro: lethargic/somnolent, as above, but oriented, answers questions appropriately  Labs and Imaging:  Lab 06/17/12 1148  WBC 20.9*  HGB 9.8*  HCT 32.0*  PLT 536*    Lab 06/17/12 1148  NA 125*  K 6.2*  CL 86*  CO2 17*  BUN 85*  CREATININE 5.00*  CALCIUM 9.6  PROT --  BILITOT --  ALKPHOS --  ALT --  AST --  GLUCOSE 657*   CBG (last 3)   Basename 06/17/12 1359 06/17/12 1219 06/17/12 1019  GLUCAP 475* 555* >600*   CXR, 10/22 @1132  Findings:  Cardiomegaly is noted. Stable dual lumen right IJ  dialysis catheter position. Central mild vascular congestion.  Mild interstitial prominence bilaterally. Mild interstitial edema  cannot be excluded. No focal infiltrate.  IMPRESSION:  Stable dual lumen right IJ dialysis catheter position. Central  mild vascular congestion. Mild interstitial prominence  bilaterally. Mild interstitial edema cannot be excluded. No focal  infiltrate.  Street, [place], MD 06/17/2012, 5:02 PM  Family Medicine Upper Level Addendum:   I have seen and examined the patient independently, discussed with Dr.Street as well as Dr. Sheffield Slider, fully reviewed the H+P and agree with it's contents with the additions as noted in blue text.    Tana Conch, MD, PGY2 06/17/2012 6:43 PM

## 2012-06-17 NOTE — ED Provider Notes (Signed)
History     CSN: [id number]  Arrival date & time 06/17/12  1036   First MD Initiated Contact with Patient 06/17/12 1107      Chief Complaint  Patient presents with  . Blood Sugar Problem     HPI Patient states on prednisone at home and CBGs have been elevated. Patient went to have port changed out this morning in radiology and patient presented lethargic, and with CBG unmeasurable. Radiology RN brought patient to ED. Patient is alert and oriented upon waking her. Answers appropriately, but very lethargic.  Past Medical History  Diagnosis Date  . DVT (deep venous thrombosis)     DVT HISTORY  . Diabetes mellitus   . Migraine     HISTORY  . Anxiety and depression   . CAD (coronary artery disease)     Last cath 2009:  LAD stent 80% stenosis. Circumflex 60-70% stenosis AV groove, right coronary artery 50-60% stenosis. She did have cutting balloon angioplasty of the LAD lesion into a diagonal. However, there was restenosis of this and it was managed medically.  . Bipolar 1 disorder   . Hyperlipidemia   . Hyperparathyroidism   . PE (pulmonary embolism)     HISTORY  . Dialysis patient   . Anemia   . Renal failure     dialysis [place] t,th sat  . Nephrotic syndrome   . Peripheral vascular disease   . MRSA infection   . Anxiety   . Depression   . Gastroparesis   . Complication of anesthesia   . PONV (postoperative nausea and vomiting)     x3 days post anesth. on 12/19/2011  . Myocardial infarction     x3 , last one - 10 yrs. ago  . Hypertension     sees Dr. Antoine Poche - [place], last stress test 11/2011, see result in EPIC, saw Dr. Antoine Poche as well at that time    . Shortness of breath   . Recurrent upper respiratory infection (URI)     Bronchitis 11/2011 - saw Dr. Loney Hering, treating currently /w antibiotic   . GERD (gastroesophageal reflux disease)   . Pneumonia     [facility]- 2012  . Noncompliance of patient with renal dialysis   . Cellulitis Oct. 2013  . MRSA (methicillin resistant  staph aureus) culture positive Oct 2013    Past Surgical History  Procedure Date  . Incise and drain abcess     OF THIGHS FROM INSULIN INJECTIONS  . Heart stents   . Portacath placement 2003  . Cataract extraction w/phaco 11/09/2011    Procedure: CATARACT EXTRACTION PHACO AND INTRAOCULAR LENS PLACEMENT (IOC);  Surgeon: Edmon Crape, MD;  Location: [facility] OR;  Service: Ophthalmology;  Laterality: Right;  . Pars plana vitrectomy 11/09/2011    Procedure: PARS PLANA VITRECTOMY WITH 23 GAUGE;  Surgeon: Edmon Crape, MD;  Location: [facility] OR;  Service: Ophthalmology;  Laterality: Right;  Ahmed Valve; Scleral Reinforcement Graft  . Dg av dialysis  shunt access exist*l* or 12/13/11    left arm  . Dialysis cath inserted & portacath      dialysis catheter inserted & portacath d/c'd- 12/19/2011  . Back surgery X 4    x4 times   . Cholecystectomy   . Eye surgery   . Cardiac catheterization   . Aorta - bilateral femoral artery bypass graft 12/28/2011    Procedure: AORTA BIFEMORAL BYPASS GRAFT;  Surgeon: Nada Libman, MD;  Location: [facility] OR;  Service: Vascular;  Laterality: N/A;  AortoBifemoral  Bypass using hemasheild 14x7 graft  . Femoral-popliteal bypass graft 12/28/2011    Procedure: BYPASS GRAFT FEMORAL-POPLITEAL ARTERY;  Surgeon: Nada Libman, MD;  Location: [facility] OR;  Service: Vascular;  Laterality: Right;  right femoral popliteal bypass using 6x80 propaten graft  . Insertion of dialysis catheter 02/15/2012    Procedure: INSERTION OF DIALYSIS CATHETER;  Surgeon: Chuck Hint, MD;  Location: [facility] OR;  Service: Vascular;  Laterality: Right;  insertion of dialysis catheter right internal jugular vein,removal of left internal jugular vein dialysis catheter  . Insertion of dialysis catheter 02/21/2012    Procedure: INSERTION OF DIALYSIS CATHETER;  Surgeon: Chuck Hint, MD;  Location: [facility] OR;  Service: Cardiovascular;  Laterality: N/A;  Insertion new dialysis catheter; possible venoplasty of fibrin  sheath of  Right IJ catheter  . Venogram 02/21/2012    Procedure: VENOGRAM;  Surgeon: Chuck Hint, MD;  Location: [facility] OR;  Service: Cardiovascular;  Laterality: N/A;  possible venoplasty  . Insertion of dialysis catheter 04/23/2012    Procedure: INSERTION OF DIALYSIS CATHETER;  Surgeon: Sherren Kerns, MD;  Location: [facility] OR;  Service: Vascular;  Laterality: Right;  using a 93fr. x 23 cannon catheter in Right Internal Jugular    Family History  Problem Relation Age of Onset  . Transient ischemic attack Mother   . Hepatitis Sister   . Diabetes type II Other   . Anesthesia problems Neg Hx   . Hypotension Neg Hx   . Malignant hyperthermia Neg Hx   . Pseudochol deficiency Neg Hx     History  Substance Use Topics  . Smoking status: Current Every Day Smoker -- 3.0 packs/day for 20 years    Types: Cigarettes  . Smokeless tobacco: Never Used   Comment: pt states she is not ready to quit   . Alcohol Use: No    OB History    Grav Para Term Preterm Abortions TAB SAB Ect Mult Living                  Review of Systems Level V caveat Allergies  Compazine; Contrast media; Meperidine hcl; Metformin and related; Morphine; Nubain; Tobramycin; Topiramate; and Toradol  Home Medications   Current Outpatient Rx  Name Route Sig Dispense Refill  . ALBUTEROL SULFATE HFA 108 (90 BASE) MCG/ACT IN AERS Inhalation Inhale 2 puffs into the lungs every 6 (six) hours as needed. Shortness of breath    . ALPRAZOLAM 1 MG PO TABS Oral Take 1 tablet (1 mg total) by mouth 3 (three) times daily as needed. For anxiety 60 tablet 0  . AMOXICILLIN-POT CLAVULANATE 400-57 MG/5ML PO SUSR Oral Take 5 mLs (400 mg total) by mouth daily. Take after dialysis on Tuesdays, Thursdays, and Saturdays. 50 mL 0  . ARIPIPRAZOLE 2 MG PO TABS Oral Take 10 mg by mouth daily.     Marland Kitchen NEPHRO-VITE 0.8 MG PO TABS Oral Take 0.8 mg by mouth at bedtime.    Marland Kitchen BACITRACIN 500 UNIT/GM OP OINT Both Eyes Place 1 application into both eyes  at bedtime. apply to eye    . BUTALBITAL-APAP-CAFFEINE 50-325-40 MG PO TABS Oral Take 1 tablet by mouth 3 (three) times daily after meals. For headaches 45 tablet 0  . CINACALCET HCL 30 MG PO TABS Oral Take 30 mg by mouth daily with supper.     Marland Kitchen CLOPIDOGREL BISULFATE 75 MG PO TABS Oral Take 75 mg by mouth daily. Last dose 12/17/2011    . COLLAGENASE 250 UNIT/GM EX OINT  Topical Apply 1 application topically daily.    Marland Kitchen DARBEPOETIN ALFA-POLYSORBATE 60 MCG/0.3ML IJ SOLN Intravenous Inject 60 mcg into the vein every Saturday with hemodialysis.    Marland Kitchen FLUTICASONE PROPIONATE 50 MCG/ACT NA SUSP Nasal Place 1 spray into the nose.     . FUROSEMIDE 40 MG PO TABS Oral Take 40 mg by mouth daily.     . INSULIN GLARGINE 100 UNIT/ML [place] SOLN Subcutaneous Inject 33 Units into the skin at bedtime.    . INSULIN LISPRO (HUMAN) 100 UNIT/ML [place] SOLN Subcutaneous Inject 7-15 Units into the skin 4 (four) times daily -  before meals and at bedtime. TAKING ON SLIDING SCALE 200-249=7 units 250-299=10 units 300-349=12 units >350=15 units    . METHOCARBAMOL 500 MG PO TABS Oral Take 500 mg by mouth 2 (two) times daily.     Marland Kitchen METOCLOPRAMIDE HCL 10 MG PO TABS Oral Take 10 mg by mouth 4 (four) times daily.    . OFLOXACIN 0.3 % OP SOLN Both Eyes Place 1 drop into both eyes 3 (three) times daily.     Marland Kitchen OMEPRAZOLE 40 MG PO CPDR Oral Take 40 mg by mouth daily.     . OXYCODONE-ACETAMINOPHEN 10-325 MG PO TABS Oral Take 1 tablet by mouth every 4 (four) hours as needed. For pain    . PAROXETINE HCL 40 MG PO TABS Oral Take 80 mg by mouth every morning.     Marland Kitchen PROMETHAZINE HCL 25 MG PO TABS Oral Take 25 mg by mouth every 6 (six) hours as needed. For nausea    . PYRIDOXINE HCL 100 MG PO TABS Oral Take 100 mg by mouth daily.    Marland Kitchen SEVELAMER CARBONATE 800 MG PO TABS Oral Take 1,600-4,000 mg by mouth 5 (five) times daily. *Take 5 tablets daily with each meal and take 2 tablets with snacks**    . ULTICARE INSULIN SYRINGE 31G X 5/16" 1 ML MISC       . VANCOMYCIN HCL IN DEXTROSE 1 GM/200ML IV SOLN Intravenous Inject 200 mLs (1,000 mg total) into the vein Every Tuesday,Thursday,and Saturday with dialysis. 4000 mL 0    To be given with dialysis starting Tuesday 10/15.  ...    BP 194/108  Pulse 107  Resp 16  Ht 5\' 10"  (1.778 m)  Wt 198 lb (89.812 kg)  BMI 28.41 kg/m2  SpO2 89%  LMP 09/03/2008  Physical Exam  Nursing note and vitals reviewed. Constitutional: She is oriented to person, place, and time. She appears well-developed and well-nourished. She appears lethargic. She appears ill.  HENT:  Head: Normocephalic and atraumatic.  Eyes: Pupils are equal, round, and reactive to light.  Neck: Normal range of motion.  Cardiovascular: Normal rate and intact distal pulses.   Pulmonary/Chest: Breath sounds normal. Tachypnea noted. No respiratory distress.  Abdominal: Normal appearance. She exhibits no distension.  Musculoskeletal: Normal range of motion.  Neurological: She is oriented to person, place, and time. She appears lethargic. No cranial nerve deficit. GCS eye subscore is 4. GCS verbal subscore is 5. GCS motor subscore is 6.  Skin: Skin is warm and dry. No rash noted.    ED Course  Procedures (including critical care time)  CRITICAL CARE Performed by: Nelva Nay L   Total critical care time: 45 min  Critical care time was exclusive of separately billable procedures and treating other patients.  Critical care was necessary to treat or prevent imminent or life-threatening deterioration.  Critical care was time spent personally by me on the  following activities: development of treatment plan with patient and/or surrogate as well as nursing, discussions with consultants, evaluation of patient's response to treatment, examination of patient, obtaining history from patient or surrogate, ordering and performing treatments and interventions, ordering and review of laboratory studies, ordering and review of radiographic  studies, pulse oximetry and re-evaluation of patient's condition.   Medications  dextrose 5 %-0.45 % sodium chloride infusion (not administered)  insulin regular bolus via infusion 0-10 Units (not administered)  insulin regular (NOVOLIN R,HUMULIN R) 1 Units/mL in sodium chloride 0.9 % 100 mL infusion (4.1 Units/hr Intravenous New Bag/Given 06/17/12 1529)  dextrose 50 % solution 25 mL (not administered)  0.9 %  sodium chloride infusion (not administered)  sodium chloride 0.9 % bolus 1,000 mL (1000 mL Intravenous New Bag/Given 06/17/12 1136)  insulin aspart (novoLOG) injection 10 Units (10 Units Intravenous Given 06/17/12 1313)    Labs Reviewed  BASIC METABOLIC PANEL - Abnormal; Notable for the following:    Sodium 125 (*)     Potassium 6.2 (*)     Chloride 86 (*)     CO2 17 (*)     Glucose, Bld 657 (*)     BUN 85 (*)     Creatinine, Ser 5.00 (*)     GFR calc non Af Amer 10 (*)     GFR calc Af Amer 11 (*)     All other components within normal limits  CBC WITH DIFFERENTIAL - Abnormal; Notable for the following:    WBC 20.9 (*)     RBC 3.28 (*)     Hemoglobin 9.8 (*)     HCT 32.0 (*)     RDW 21.0 (*)     Platelets 536 (*)     Neutrophils Relative 95 (*)     Neutro Abs 19.9 (*)     Lymphocytes Relative 3 (*)     Monocytes Relative 1 (*)     All other components within normal limits  GLUCOSE, CAPILLARY - Abnormal; Notable for the following:    Glucose-Capillary 555 (*)     All other components within normal limits  GLUCOSE, CAPILLARY - Abnormal; Notable for the following:    Glucose-Capillary 475 (*)     All other components within normal limits  LACTIC ACID, PLASMA  BLOOD GAS, ARTERIAL  PRO B NATRIURETIC PEPTIDE   Dg Chest Portable 1 View  06/17/2012  *RADIOLOGY REPORT*  Clinical Data: Hyperglycemia, dialysis patient,  PORTABLE CHEST - 1 VIEW  Comparison: 06/05/2012  Findings: Cardiomegaly is noted.  Stable dual lumen right IJ dialysis catheter position.  Central mild  vascular congestion. Mild interstitial prominence bilaterally.  Mild interstitial edema cannot be excluded.  No focal infiltrate.  IMPRESSION:  Stable dual lumen right IJ dialysis catheter position.  Central mild vascular congestion.  Mild interstitial prominence bilaterally.  Mild interstitial edema cannot be excluded.  No focal infiltrate.   Original Report Authenticated By: Natasha Mead, M.D.      1. DKA (diabetic ketoacidoses)       MDM  Family practice notified.  Patient will be admitted to the hospital.  Patient placed on Glucomander for blood sugar control.        Nelia Shi, MD 06/19/12 [phone]

## 2012-06-17 NOTE — H&P (Signed)
I interviewed and examined this patient and discussed the care plan with Dr. [name] and the [facility] team and agree with assessment and plan as documented in the admission note for today. She clearly has OSA with snoring and frequent apnea during which she desaturates to the upper 80's while on O2 via nasal cannula. She is very lethargic, but awakens easily. She was asked several times and refused CPAP. CO2 monitoring showed levels no higher than 20-40. She is at high risk, however, for respiratory arrest caused by sedating medications, so we will decrease her Alprazolam dose and be judicious with narcotics and barbituates.     Darline Faith A. Sheffield Slider, MD Family Medicine Teaching Service Attending  06/17/2012 9:48 PM

## 2012-06-17 NOTE — Progress Notes (Signed)
ANTIBIOTIC CONSULT NOTE - INITIAL  Pharmacy Consult for Vancomycin Indication: Cellulitis to lower extremities  Allergies  Allergen Reactions  . Compazine (Prochlorperazine) Swelling    Per echart records (takes promethazine at home)  . Contrast Media (Iodinated Diagnostic Agents) Swelling    Per echart records  . Meperidine Hcl Swelling  . Metformin And Related Other (See Comments)    'just not myself, was told not to take metformin'  . Morphine Swelling  . Nubain (Nalbuphine Hcl) Swelling  . Tobramycin Other (See Comments)    Unknown reaction  . Topiramate (Topamax) Swelling    Tongue swelling - per echart records  . Toradol (Ketorolac Tromethamine) Swelling   Patient Measurements: Height: 5\' 10"  (177.8 cm) Weight: 198 lb (89.812 kg) IBW/kg (Calculated) : 68.5   Vital Signs: Temp: 97.7 F (36.5 C) (10/22 1804) Temp src: Oral (10/22 1804) BP: 167/97 mmHg (10/22 1804) Pulse Rate: 100  (10/22 1804)  Labs:  Basename 06/17/12 1148  WBC 20.9*  HGB 9.8*  PLT 536*  LABCREA --  CREATININE 5.00*   Estimated Creatinine Clearance: 17.8 ml/min (by C-G formula based on Cr of 5).  Microbiology: Recent Results (from the past 720 hour(s))  CULTURE, BLOOD (ROUTINE X 2)     Status: Normal   Collection Time   06/05/12  4:50 PM      Component Value Range Status Comment   Specimen Description BLOOD HAND LEFT   Final    Special Requests BOTTLES DRAWN AEROBIC ONLY 5CC   Final    Culture  Setup Time 06/05/2012 21:04   Final    Culture NO GROWTH 5 DAYS   Final    Report Status 06/11/2012 FINAL   Final   CULTURE, BLOOD (ROUTINE X 2)     Status: Normal   Collection Time   06/05/12  4:51 PM      Component Value Range Status Comment   Specimen Description BLOOD HAND RIGHT   Final    Special Requests BOTTLES DRAWN AEROBIC ONLY 10CC   Final    Culture  Setup Time 06/05/2012 21:04   Final    Culture NO GROWTH 5 DAYS   Final    Report Status 06/11/2012 FINAL   Final   MRSA PCR  SCREENING     Status: Abnormal   Collection Time   06/06/12  1:39 AM      Component Value Range Status Comment   MRSA by PCR POSITIVE (*) NEGATIVE Final    Medical History: Past Medical History  Diagnosis Date  . DVT (deep venous thrombosis)     DVT HISTORY  . Diabetes mellitus   . Migraine     HISTORY  . Anxiety and depression   . CAD (coronary artery disease)     Last cath 2009:  LAD stent 80% stenosis. Circumflex 60-70% stenosis AV groove, right coronary artery 50-60% stenosis. She did have cutting balloon angioplasty of the LAD lesion into a diagonal. However, there was restenosis of this and it was managed medically.  . Bipolar 1 disorder   . Hyperlipidemia   . Hyperparathyroidism   . PE (pulmonary embolism)     HISTORY  . Dialysis patient   . Anemia   . Renal failure     dialysis [place] t,th sat  . Nephrotic syndrome   . Peripheral vascular disease   . MRSA infection   . Anxiety   . Depression   . Gastroparesis   . Complication of anesthesia   . PONV (postoperative  nausea and vomiting)     x3 days post anesth. on 12/19/2011  . Myocardial infarction     x3 , last one - 10 yrs. ago  . Hypertension     sees Dr. Antoine Poche - [place], last stress test 11/2011, see result in EPIC, saw Dr. Antoine Poche as well at that time    . Shortness of breath   . Recurrent upper respiratory infection (URI)     Bronchitis 11/2011 - saw Dr. Loney Hering, treating currently /w antibiotic   . GERD (gastroesophageal reflux disease)   . Pneumonia     [facility]- 2012  . Noncompliance of patient with renal dialysis   . Cellulitis Oct. 2013  . MRSA (methicillin resistant staph aureus) culture positive Oct 2013   Medications:  Anti-infectives     Start     Dose/Rate Route Frequency Ordered Stop   06/17/12 1830   amoxicillin-clavulanate (AUGMENTIN) 400-57 MG/5ML suspension 400 mg        400 mg Oral Daily 06/17/12 1824           Assessment: 41yo female admitted recently for lower extremity cellulitis and  was placed on IV Vancomycin and Zosyn.  The Zosyn was changed to Augmentin and the Vancomycin was to be continued through 10/22 per Dr. Hall Busing last discharge note ("Please make sure she is taking Augmentin through 10/23 and is receiving vancomycin at dialysis, first dose on 10/15 through 10/22.").  Today she returns with noted skin breakdown on her buttocks with some lethargy and an elevated CBG.  She typically gets HD on TTHS but did not go today due to planned catheter change.  Renal has been consulted and the plan is likely for HD tomorrow.  Goal of Therapy:  Therapeutic response to IV antibiotics  Plan:   - Will continue her HD regimen - however, need to determine length of therapy if she is to continue this past the 10/22 date as requested by Dr. Willaim Bane.  - Her HD dose is 1gm every T, Th, Sat.  She will not need a dose today since she did not have HD today.  Nadara Mustard, PharmD., MS Clinical Pharmacist Pager:  [phone] Thank you for allowing pharmacy to be part of this patients care team.  06/17/2012,6:51 PM

## 2012-06-17 NOTE — ED Notes (Signed)
Patient states on prednisone at home and CBGs have been elevated.  Patient went to have port changed out this morning in radiology and patient presented lethargic, and with CBG unmeasurable.   Radiology RN brought patient to ED.  Patient is alert and oriented upon waking her.   Answers appropriately, but very lethargic.

## 2012-06-17 NOTE — Progress Notes (Signed)
Subjective: Interval History: has complaints Catheter not working.  Has soreness on buttocks.  Apparently was to have cath change and did not go to HD today.  No HD since Sat.  ??received steroids for ? Contrast allergy 9? Plan.  Long hx Type 2 DM, CAD with angioplasty/stent, PVD with AFBG and wound breakdown, Bipolar, PUD , .Anemia, HPTH, ongoing Smoking, and ESRD.   Was here a week ago with LLE cellulitis, and not getting better despite IVAB.  On Vanco at [facility].  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  Not SOB now.  Chronic pain syndrome.  Admitted with hyperosmolar syndrome.  K 6.2 and bicarb 17. No ketones done.  Objective: Vital signs in last 24 hours: Temp:  [97.4 F (36.3 C)-98.4 F (36.9 C)] 97.7 F (36.5 C) (10/22 1804) Pulse Rate:  [91-107] 100  (10/22 1804) Resp:  [16-24] 16  (10/22 1726) BP: (166-201)/(72-108) 167/97 mmHg (10/22 1804) SpO2:  [89 %-98 %] 98 % (10/22 1804) Weight:  [89.812 kg (198 lb)] 89.812 kg (198 lb) (10/22 1044) Weight change:   Intake/Output from previous day:   Intake/Output this shift:    General appearance: delirious, slowed mentation, toxic and lethargic but knows me arousable with stimulation only and falls off to sleep Head: swollen head Eyes: DM retinopathy Throat: reddened mucosa, stained teeth Neck: PCL Resp: diminished breath sounds bilaterally, rhonchi bilaterally and decreased expansion Chest wall: RIJ Cath Cardio: regular rate and rhythm, S4 present and systolic murmur: holosystolic 2/6, blowing at apex GI: obese, pos bs, liver down 5 cm, skin with SQ nodules and firmness in nodules Extremities: edema 3+ and Redness below knee on L, Much edema, No DP pulses, trophic changes LE.  Wounds in groins healed Skin: see abdm, also skin dyscolored in abdm, peau d orang skin of LE  Lab Results:  Basename 06/17/12 1148  WBC 20.9*  HGB 9.8*  HCT 32.0*  PLT 536*   BMET:  Basename 06/17/12 1148  NA  125*  K 6.2*  CL 86*  CO2 17*  GLUCOSE 657*  BUN 85*  CREATININE 5.00*  CALCIUM 9.6   No results found for this basename: PTH:2 in the last 72 hours Iron Studies: No results found for this basename: IRON,TIBC,TRANSFERRIN,FERRITIN in the last 72 hours  Studies/Results: Dg Chest Portable 1 View  06/17/2012  *RADIOLOGY REPORT*  Clinical Data: Hyperglycemia, dialysis patient,  PORTABLE CHEST - 1 VIEW  Comparison: 06/05/2012  Findings: Cardiomegaly is noted.  Stable dual lumen right IJ dialysis catheter position.  Central mild vascular congestion. Mild interstitial prominence bilaterally.  Mild interstitial edema cannot be excluded.  No focal infiltrate.  IMPRESSION:  Stable dual lumen right IJ dialysis catheter position.  Central mild vascular congestion.  Mild interstitial prominence bilaterally.  Mild interstitial edema cannot be excluded.  No focal infiltrate.   Original Report Authenticated By: Natasha Mead, M.D.     I have reviewed the patient's current medications.  Assessment/Plan: 1 ESRD VOL xs, acidemic, and poorly functional cath.  ??plans to change in am, and will dialyze.  Needs long HD and much UF, probably daily HD to get down 20 lb or so. 2 ACCESS 3 Anemia use EPO 4 DM per primary HyperOSM ? DKA but doubt 5 PVD 6 CAD 7 Bipolar 8 Cellulitis  Needs AB but a lot of fluid needs off 9 HPTH  P HD, access, check Fe, EPO, AB    LOS: 0 days   [name] L  06/17/2012,7:04 PM

## 2012-06-18 ENCOUNTER — Inpatient Hospital Stay (HOSPITAL_COMMUNITY): Payer: Medicare Other

## 2012-06-18 DIAGNOSIS — E871 Hypo-osmolality and hyponatremia: Secondary | ICD-10-CM | POA: Diagnosis present

## 2012-06-18 LAB — GLUCOSE, CAPILLARY
Glucose-Capillary: 99 mg/dL (ref 70–99)
Glucose-Capillary: 129 mg/dL — ABNORMAL HIGH (ref 70–99)
Glucose-Capillary: 134 mg/dL — ABNORMAL HIGH (ref 70–99)
Glucose-Capillary: 163 mg/dL — ABNORMAL HIGH (ref 70–99)
Glucose-Capillary: 162 mg/dL — ABNORMAL HIGH (ref 70–99)
Glucose-Capillary: 174 mg/dL — ABNORMAL HIGH (ref 70–99)

## 2012-06-18 LAB — CBC WITH DIFFERENTIAL/PLATELET
Basophils Relative: 0 % (ref 0–1)
Eosinophils Relative: 0 % (ref 0–5)
HCT: 30.3 % — ABNORMAL LOW (ref 36.0–46.0)
Hemoglobin: 9.5 g/dL — ABNORMAL LOW (ref 12.0–15.0)
Lymphocytes Relative: 4 % — ABNORMAL LOW (ref 12–46)
Lymphs Abs: 1 10*3/uL (ref 0.7–4.0)
MCV: 95.9 fL (ref 78.0–100.0)
Monocytes Relative: 4 % (ref 3–12)
Neutro Abs: 22.1 10*3/uL — ABNORMAL HIGH (ref 1.7–7.7)
RBC: 3.16 MIL/uL — ABNORMAL LOW (ref 3.87–5.11)
WBC: 24.1 10*3/uL — ABNORMAL HIGH (ref 4.0–10.5)

## 2012-06-18 LAB — BASIC METABOLIC PANEL
BUN: 91 mg/dL — ABNORMAL HIGH (ref 6–23)
BUN: 94 mg/dL — ABNORMAL HIGH (ref 6–23)
BUN: 94 mg/dL — ABNORMAL HIGH (ref 6–23)
BUN: 96 mg/dL — ABNORMAL HIGH (ref 6–23)
BUN: 70 mg/dL — ABNORMAL HIGH (ref 6–23)
CO2: 20 mEq/L (ref 19–32)
CO2: 19 mEq/L (ref 19–32)
CO2: 22 mEq/L (ref 19–32)
CO2: 21 mEq/L (ref 19–32)
Calcium: 9.7 mg/dL (ref 8.4–10.5)
Calcium: 9.6 mg/dL (ref 8.4–10.5)
Calcium: 8.7 mg/dL (ref 8.4–10.5)
Calcium: 8.4 mg/dL (ref 8.4–10.5)
Creatinine, Ser: 4.24 mg/dL — ABNORMAL HIGH (ref 0.50–1.10)
Creatinine, Ser: 4.74 mg/dL — ABNORMAL HIGH (ref 0.50–1.10)
Creatinine, Ser: 5.41 mg/dL — ABNORMAL HIGH (ref 0.50–1.10)
Creatinine, Ser: 5.71 mg/dL — ABNORMAL HIGH (ref 0.50–1.10)
Creatinine, Ser: 5.89 mg/dL — ABNORMAL HIGH (ref 0.50–1.10)
GFR calc Af Amer: 9 mL/min — ABNORMAL LOW (ref >90)
GFR calc Af Amer: 14 mL/min — ABNORMAL LOW (ref >90)
GFR calc non Af Amer: 10 mL/min — ABNORMAL LOW (ref >90)
GFR calc non Af Amer: 8 mL/min — ABNORMAL LOW (ref >90)
GFR calc non Af Amer: 8 mL/min — ABNORMAL LOW (ref >90)
GFR calc non Af Amer: 9 mL/min — ABNORMAL LOW (ref >90)
Glucose, Bld: 120 mg/dL — ABNORMAL HIGH (ref 70–99)
Glucose, Bld: 141 mg/dL — ABNORMAL HIGH (ref 70–99)
Glucose, Bld: 167 mg/dL — ABNORMAL HIGH (ref 70–99)
Glucose, Bld: 202 mg/dL — ABNORMAL HIGH (ref 70–99)
Glucose, Bld: 320 mg/dL — ABNORMAL HIGH (ref 70–99)
Potassium: 5.5 mEq/L — ABNORMAL HIGH (ref 3.5–5.1)
Sodium: 135 mEq/L (ref 135–145)

## 2012-06-18 LAB — COMPREHENSIVE METABOLIC PANEL
Albumin: 2.3 g/dL — ABNORMAL LOW (ref 3.5–5.2)
Alkaline Phosphatase: 211 U/L — ABNORMAL HIGH (ref 39–117)
BUN: 93 mg/dL — ABNORMAL HIGH (ref 6–23)
Calcium: 9.7 mg/dL (ref 8.4–10.5)
GFR calc Af Amer: 10 mL/min — ABNORMAL LOW (ref >90)
Potassium: 6.5 mEq/L (ref 3.5–5.1)
Total Protein: 7.3 g/dL (ref 6.0–8.3)

## 2012-06-18 LAB — HEMOGLOBIN A1C: Hgb A1c MFr Bld: 7.4 % — ABNORMAL HIGH (ref <5.7)

## 2012-06-18 LAB — MAGNESIUM: Magnesium: 3.8 mg/dL — ABNORMAL HIGH (ref 1.5–2.5)

## 2012-06-18 LAB — PHOSPHORUS: Phosphorus: 9.1 mg/dL — ABNORMAL HIGH (ref 2.3–4.6)

## 2012-06-18 MED ORDER — VANCOMYCIN HCL IN DEXTROSE 1-5 GM/200ML-% IV SOLN
1000.0000 mg | Freq: Once | INTRAVENOUS | Status: AC
  Administered 2012-06-18: 1000 mg via INTRAVENOUS
  Filled 2012-06-18: qty 200

## 2012-06-18 MED ORDER — MUPIROCIN CALCIUM 2 % EX CREA
TOPICAL_CREAM | Freq: Every day | CUTANEOUS | Status: DC
  Administered 2012-06-18: 1 via TOPICAL
  Administered 2012-06-19 – 2012-06-27 (×9): via TOPICAL
  Filled 2012-06-18: qty 15

## 2012-06-18 MED ORDER — COLLAGENASE 250 UNIT/GM EX OINT: TOPICAL_OINTMENT | Freq: Every day | CUTANEOUS | Status: DC

## 2012-06-18 MED ORDER — INSULIN GLARGINE 100 UNIT/ML ~~LOC~~ SOLN
33.0000 [IU] | Freq: Every day | SUBCUTANEOUS | Status: DC
  Administered 2012-06-18: 33 [IU] via SUBCUTANEOUS

## 2012-06-18 MED ORDER — OXYCODONE-ACETAMINOPHEN 5-325 MG PO TABS
ORAL_TABLET | ORAL | Status: AC
  Administered 2012-06-18: 1 via ORAL
  Filled 2012-06-18: qty 2

## 2012-06-18 MED ORDER — INSULIN GLARGINE 100 UNIT/ML ~~LOC~~ SOLN
5.0000 [IU] | Freq: Once | SUBCUTANEOUS | Status: AC
  Administered 2012-06-18: 5 [IU] via SUBCUTANEOUS

## 2012-06-18 MED ORDER — COLLAGENASE 250 UNIT/GM EX OINT
TOPICAL_OINTMENT | Freq: Every day | CUTANEOUS | Status: DC
  Administered 2012-06-18: 12:00:00 via TOPICAL
  Filled 2012-06-18: qty 30

## 2012-06-18 MED ORDER — OXYCODONE-ACETAMINOPHEN 5-325 MG PO TABS
ORAL_TABLET | ORAL | Status: AC
  Filled 2012-06-18: qty 2

## 2012-06-18 MED ORDER — INSULIN ASPART 100 UNIT/ML ~~LOC~~ SOLN
0.0000 [IU] | Freq: Three times a day (TID) | SUBCUTANEOUS | Status: DC
  Administered 2012-06-18: 15 [IU] via SUBCUTANEOUS
  Administered 2012-06-18: 4 [IU] via SUBCUTANEOUS

## 2012-06-18 MED ORDER — VANCOMYCIN HCL IN DEXTROSE 1-5 GM/200ML-% IV SOLN: 1000.0000 mg | INTRAVENOUS | Status: DC

## 2012-06-18 MED ORDER — HEPARIN SODIUM (PORCINE) 1000 UNIT/ML IJ SOLN
INTRAMUSCULAR | Status: AC
  Filled 2012-06-18: qty 1

## 2012-06-18 MED ORDER — AMOXICILLIN-POT CLAVULANATE 400-57 MG/5ML PO SUSR
500.0000 mg | Freq: Every day | ORAL | Status: DC
  Administered 2012-06-18: 500 mg via ORAL
  Filled 2012-06-18 (×2): qty 6.3

## 2012-06-18 MED ORDER — CHLORHEXIDINE GLUCONATE 4 % EX LIQD
CUTANEOUS | Status: AC
  Filled 2012-06-18: qty 45

## 2012-06-18 MED ORDER — INSULIN ASPART 100 UNIT/ML ~~LOC~~ SOLN
5.0000 [IU] | Freq: Once | SUBCUTANEOUS | Status: AC
  Administered 2012-06-18: 5 [IU] via SUBCUTANEOUS

## 2012-06-18 MED ORDER — SODIUM CHLORIDE 0.9 % IV SOLN
125.0000 mg | INTRAVENOUS | Status: DC
  Administered 2012-06-18: 125 mg via INTRAVENOUS
  Filled 2012-06-18 (×2): qty 10

## 2012-06-18 NOTE — Progress Notes (Signed)
Pt transferred to dialysis. Report called to receiving nurse and all questions answered. Vital signs stable prior to leaving.

## 2012-06-18 NOTE — Clinical Documentation Improvement (Signed)
Abnormal Labs Clarification  THIS DOCUMENT IS NOT A PERMANENT PART OF THE MEDICAL RECORD  TO RESPOND TO THE THIS QUERY, FOLLOW THE INSTRUCTIONS BELOW:  1. If needed, update documentation for the patient's encounter via the notes activity.  2. Access this query again and click edit on the [employer].  3. After updating, or not, click F2 to complete all highlighted (required) fields concerning your review. Select "additional documentation in the medical record" OR "no additional documentation provided".  4. Click Sign note button.  5. The deficiency will fall out of your InBasket *Please let us know if you are not able to complete this workflow by phone or e-mail (listed below).  Please update your documentation within the medical record to reflect your response to this query.                                                                                   06/18/12  Dear Dr. Stephanie [name]. [name] /Associates  In a better effort to capture your patient's severity of illness, reflect appropriate length of stay and utilization of resources, a review of the medical record has revealed the following indicators.    Based on your clinical judgment, please clarify and document in a progress note and/or discharge summary the clinical condition associated with the following supporting information:  In responding to this query please exercise your independent judgment.  The fact that a query is asked, does not imply that any particular answer is desired or expected.  Abnormal findings (laboratory, x-ray, pathologic, and other diagnostic results) are not coded and reported unless the physician indicates their clinical significance.   The medical record reflects the following clinical findings, please clarify the diagnostic and/or clinical significance:      Noted patient's NA level range (129 (L) 131 (L) 130 (L) 130 (L) 131 (L) 129 (L) 127 (L) since admit, please document secondary diagnosis if  appropriate.  Thank you     Possible Clinical Conditions?                                  Hyponatremia  Hyposmolality   Other Condition           Cannot Clinically Determine     Treatment: Hemodialysis after Rt IJ HD catheter replaced   Evaluation : BMET ordered q 4 hours     Reviewed: additional documentation in the medical record; see Assessment and Plan #2 (ESRD) from my progress note 10/23. --CMS   Thank You,  [name]  Clinical Documentation Specialist RN, BSN: Pager [phone] / HIM off # [phone]  Health Information Management [place]

## 2012-06-18 NOTE — Progress Notes (Signed)
OT Cancellation Note  Patient Details Name: [name] MRN: [id number] DOB: August 14, 1971   Cancelled Treatment:    Reason Eval/Treat Not Completed: Patient at procedure or test/ unavailable: pt on the way to HD per RN. Will attempt to see pt tomorrow. Thank you. Glendale Chard, OTR/L Pager: [phone] 06/18/2012    Kengo Sturges 06/18/2012, 2:22 PM

## 2012-06-18 NOTE — Progress Notes (Signed)
Utilization Review Completed.  

## 2012-06-18 NOTE — Progress Notes (Signed)
Have collaborated with this patient and note. 06/18/2012  [place] Bing, PT [phone] (pager)

## 2012-06-18 NOTE — Progress Notes (Signed)
Patient arrived to hemodialysis well over her estimated dry weight. Patient with +2 edema in both ankles. Goal of 5liters to be removed during hemodialysis with a run time of 4.5 hours. Pt requested to be discharged early due to back pain. Pain medication offered as scheduled at 1715. Patient stated that she continue her treatment  If pain resolved. At 1730 patients machine alarmed low flow error. Despite attempts to correct the problem, machine could not be brought to function. The situation was explained to machine while preparing to set up another hemodialysis machine. Once the situation was explained to patient, she refused to restart her treatment, stating that she was in too much pain. I urged the patient to continue dialysis due to the fact that she had large fluid gains with visible swelling. Patient understands that she has fluid gains and understands the complications due to large fluid gians,  but states that she will run tomorrow. This is my second time with this patient and this is the second instance in which she requested to discontinue tx. Dr Briant Cedar has been page and is aware of todays events.

## 2012-06-18 NOTE — Progress Notes (Signed)
[facility]  Subjective:  Awake, lying in bed,awaiting new HD cath.   Says her last dialysis was Saturday.  Most recent CBG 154 at 9 AM   Objective: Vital signs in last 24 hours: Blood pressure 182/90, pulse 98, temperature 97.7 F (36.5 C), temperature source Oral, resp. rate 9, height 5\' 10"  (1.778 m), weight 91.3 kg (201 lb 4.5 oz), last menstrual period 09/03/2008, SpO2 98.00%.    PHYSICAL EXAM General--As above, edematous face Chest--HD cath R IJ, rhonchi Heart--no rub Abd--nontender, sacral decub Extr--marked edema  Dialysis orders from [facility]: 4 1/4 hr, 2K, 2.25 Ca, EDW 80 kg!, EPO 15,000, venofer 100/ wk.  No hectorol  Lab Results:   Lab 06/18/12 0420 06/18/12 0200 06/18/12 0020  NA 130* 130* 131*  K 5.9* 6.5* 5.5*  CL 91* 91* 92*  CO2 20 17* 18*  BUN 94* 93* 91*  CREATININE 5.71* 5.44* 5.41*  ALB -- -- --  GLUCOSE 141* -- --  CALCIUM 9.7 9.7 9.7  PHOS -- 9.1* --     Basename 06/18/12 0200 06/17/12 2205  WBC 24.1* 25.0*  HGB 9.5* 9.5*  HCT 30.3* 30.7*  PLT 450* 559*   I have reviewed the patient's current medications.  Assessment/Plan: 1.  ESRD--for new cath and HD today. She is markedly edematous.  Needs lots of fluid removal 2.  Anemia--on aranesp and venofer 100/wk 3.  DM--CBGs ok for her to go to IR for new HD cath 4.  PVD--no new suggestions 5.  CAD--   "    "           " 6.  Diarrhea--c. diff pending 7.  Sacral decub--RN requests Santyl ointment and wound care consult (I ordered) 8.  LE cellulitis--on vanco 9.  Secondary PTH--phosphorus too high to allow hectorol.  Will give P binders--on Sevelamer     LOS: 1 day   Jose Alleyne F 06/18/2012,10:07 AM   .labalb

## 2012-06-18 NOTE — Progress Notes (Signed)
Pt returned early from dialysis, d/t pt refusal to continue treatment. Pt stable upon arrival, however was noted that pt heart rate is slightly elevated in the one teens. Will continue to monitor.

## 2012-06-18 NOTE — Progress Notes (Addendum)
ANTIBIOTIC CONSULT NOTE - FOLLOW UP  Pharmacy Consult:  Vancomycin / Augmentin Indication: cellulitis   Allergies  Allergen Reactions  . Compazine (Prochlorperazine) Swelling    Per echart records (takes promethazine at home)  . Contrast Media (Iodinated Diagnostic Agents) Swelling    Per echart records  . Meperidine Hcl Swelling  . Metformin And Related Other (See Comments)    'just not myself, was told not to take metformin'  . Morphine Swelling  . Nubain (Nalbuphine Hcl) Swelling  . Tobramycin Other (See Comments)    Unknown reaction  . Topiramate (Topamax) Swelling    Tongue swelling - per echart records  . Toradol (Ketorolac Tromethamine) Swelling    Patient Measurements: Height: 5\' 10"  (177.8 cm) Weight: 201 lb 4.5 oz (91.3 kg) IBW/kg (Calculated) : 68.5   Vital Signs: Temp: 97.7 F (36.5 C) (10/23 0749) Temp src: Oral (10/23 0749) BP: 182/90 mmHg (10/23 0749) Pulse Rate: 98  (10/23 0749) Intake/Output from previous day: 10/22 0701 - 10/23 0700 In: 80  Out: -  Intake/Output from this shift: Total I/O In: 10 [Other:10] Out: 250 [Urine:250]  Labs:  [facility] 06/18/12 1402 06/18/12 0853 06/18/12 0420 06/18/12 0200 06/17/12 2205 06/17/12 1842  WBC -- -- -- 24.1* 25.0* 24.9*  HGB -- -- -- 9.5* 9.5* 9.9*  PLT -- -- -- 450* 559* 604*  LABCREA -- -- -- -- -- --  CREATININE 5.89* 5.66* 5.71* -- -- --   Estimated Creatinine Clearance: 15.2 ml/min (by C-G formula based on Cr of 5.89). No results found for this basename: VANCOTROUGH:2,VANCOPEAK:2,VANCORANDOM:2,GENTTROUGH:2,GENTPEAK:2,GENTRANDOM:2,TOBRATROUGH:2,TOBRAPEAK:2,TOBRARND:2,AMIKACINPEAK:2,AMIKACINTROU:2,AMIKACIN:2, in the last 72 hours   Microbiology: Recent Results (from the past 720 hour(s))  CULTURE, BLOOD (ROUTINE X 2)     Status: Normal   Collection Time   06/05/12  4:50 PM      Component Value Range Status Comment   Specimen Description BLOOD HAND LEFT   Final    Special Requests BOTTLES DRAWN  AEROBIC ONLY 5CC   Final    Culture  Setup Time 06/05/2012 21:04   Final    Culture NO GROWTH 5 DAYS   Final    Report Status 06/11/2012 FINAL   Final   CULTURE, BLOOD (ROUTINE X 2)     Status: Normal   Collection Time   06/05/12  4:51 PM      Component Value Range Status Comment   Specimen Description BLOOD HAND RIGHT   Final    Special Requests BOTTLES DRAWN AEROBIC ONLY 10CC   Final    Culture  Setup Time 06/05/2012 21:04   Final    Culture NO GROWTH 5 DAYS   Final    Report Status 06/11/2012 FINAL   Final   MRSA PCR SCREENING     Status: Abnormal   Collection Time   06/06/12  1:39 AM      Component Value Range Status Comment   MRSA by PCR POSITIVE (*) NEGATIVE Final   MRSA PCR SCREENING     Status: Normal   Collection Time   06/17/12  6:09 PM      Component Value Range Status Comment   MRSA by PCR NEGATIVE  NEGATIVE Final        Assessment: 28 YOF admitted recently for lower extremity cellulitis and was placed on IV Vancomycin and Zosyn. The Zosyn was changed to Augmentin and the Vancomycin was to be continued through 10/22 per Dr. Hall Busing last discharge note. She returned 10/22 with noted skin breakdown on her buttocks and to continue  on her current antibiotics.  She has ESRD and typically gets HD on TTS; her last HD session was on Saturday 06/14/12.  Patient to get HD today and perhaps daily until euvolemic.  Vancomycin 1gm IV x 1 was ordered to be given with HD today; however, dose was already given this morning prior to HD.  Expect vancomycin level to be elevated if checked now since patient has been on med since 06/08/12.   Goal of Therapy:  Vanc pre-HD level 15-25 mcg/ml   Plan:  - Will not order an additional vanc dose post HD today - Check random level in AM to determine further doses - F/U HD schedule to order vancomycin - Change Augmentin to 500mg  PO QHS     Riddhi Grether D. Laney Potash, PharmD, BCPS Pager:  [phone] 06/18/2012, 3:15 PM

## 2012-06-18 NOTE — H&P (Signed)
[name] is an 41 y.o. female.   Chief Complaint: ESRD; Rt IJ dialysis catheter malfunctioning Last use 10/19; maybe half treatment per pt Last intervention: exchange 03/28/12 Was scheduled as OP for exchange yesterday Pt with altered mental status upon arrival to Radiology Glucose over 500 Sent to ER and admitted then HPI: ESRD; DM; CAD/MI; smoker; Bipolar; HLD; HPTH; hx PE; HTN; GERD  Past Medical History  Diagnosis Date  . DVT (deep venous thrombosis)     DVT HISTORY  . Diabetes mellitus   . Migraine     HISTORY  . Anxiety and depression   . CAD (coronary artery disease)     Last cath 2009:  LAD stent 80% stenosis. Circumflex 60-70% stenosis AV groove, right coronary artery 50-60% stenosis. She did have cutting balloon angioplasty of the LAD lesion into a diagonal. However, there was restenosis of this and it was managed medically.  . Bipolar 1 disorder   . Hyperlipidemia   . Hyperparathyroidism   . PE (pulmonary embolism)     HISTORY  . Dialysis patient   . Anemia   . Renal failure     dialysis [place] t,th sat  . Nephrotic syndrome   . Peripheral vascular disease   . MRSA infection   . Anxiety   . Depression   . Gastroparesis   . Complication of anesthesia   . PONV (postoperative nausea and vomiting)     x3 days post anesth. on 12/19/2011  . Myocardial infarction     x3 , last one - 10 yrs. ago  . Hypertension     sees Dr. Antoine Poche - [place], last stress test 11/2011, see result in EPIC, saw Dr. Antoine Poche as well at that time    . Shortness of breath   . Recurrent upper respiratory infection (URI)     Bronchitis 11/2011 - saw Dr. Loney Hering, treating currently /w antibiotic   . GERD (gastroesophageal reflux disease)   . Pneumonia     [facility]- 2012  . Noncompliance of patient with renal dialysis   . Cellulitis Oct. 2013  . MRSA (methicillin resistant staph aureus) culture positive Oct 2013    Past Surgical History  Procedure Date  . Incise and drain abcess     OF  THIGHS FROM INSULIN INJECTIONS  . Heart stents   . Portacath placement 2003  . Cataract extraction w/phaco 11/09/2011    Procedure: CATARACT EXTRACTION PHACO AND INTRAOCULAR LENS PLACEMENT (IOC);  Surgeon: Edmon Crape, MD;  Location: [facility] OR;  Service: Ophthalmology;  Laterality: Right;  . Pars plana vitrectomy 11/09/2011    Procedure: PARS PLANA VITRECTOMY WITH 23 GAUGE;  Surgeon: Edmon Crape, MD;  Location: [facility] OR;  Service: Ophthalmology;  Laterality: Right;  Ahmed Valve; Scleral Reinforcement Graft  . Dg av dialysis  shunt access exist*l* or 12/13/11    left arm  . Dialysis cath inserted & portacath      dialysis catheter inserted & portacath d/c'd- 12/19/2011  . Back surgery X 4    x4 times   . Cholecystectomy   . Eye surgery   . Cardiac catheterization   . Aorta - bilateral femoral artery bypass graft 12/28/2011    Procedure: AORTA BIFEMORAL BYPASS GRAFT;  Surgeon: Nada Libman, MD;  Location: [facility] OR;  Service: Vascular;  Laterality: N/A;  AortoBifemoral Bypass using hemasheild 14x7 graft  . Femoral-popliteal bypass graft 12/28/2011    Procedure: BYPASS GRAFT FEMORAL-POPLITEAL ARTERY;  Surgeon: Nada Libman, MD;  Location: [facility]  OR;  Service: Vascular;  Laterality: Right;  right femoral popliteal bypass using 6x80 propaten graft  . Insertion of dialysis catheter 02/15/2012    Procedure: INSERTION OF DIALYSIS CATHETER;  Surgeon: Chuck Hint, MD;  Location: [facility] OR;  Service: Vascular;  Laterality: Right;  insertion of dialysis catheter right internal jugular vein,removal of left internal jugular vein dialysis catheter  . Insertion of dialysis catheter 02/21/2012    Procedure: INSERTION OF DIALYSIS CATHETER;  Surgeon: Chuck Hint, MD;  Location: [facility] OR;  Service: Cardiovascular;  Laterality: N/A;  Insertion new dialysis catheter; possible venoplasty of fibrin sheath of  Right IJ catheter  . Venogram 02/21/2012    Procedure: VENOGRAM;  Surgeon: Chuck Hint, MD;   Location: [facility] OR;  Service: Cardiovascular;  Laterality: N/A;  possible venoplasty  . Insertion of dialysis catheter 04/23/2012    Procedure: INSERTION OF DIALYSIS CATHETER;  Surgeon: Sherren Kerns, MD;  Location: [facility] OR;  Service: Vascular;  Laterality: Right;  using a 61fr. x 23 cannon catheter in Right Internal Jugular    Family History  Problem Relation Age of Onset  . Transient ischemic attack Mother   . Hepatitis Sister   . Diabetes type II Other   . Anesthesia problems Neg Hx   . Hypotension Neg Hx   . Malignant hyperthermia Neg Hx   . Pseudochol deficiency Neg Hx    Social History:  reports that she has been smoking Cigarettes.  She has a 60 pack-year smoking history. She has never used smokeless tobacco. She reports that she does not drink alcohol or use illicit drugs.  Allergies:  Allergies  Allergen Reactions  . Compazine (Prochlorperazine) Swelling    Per echart records (takes promethazine at home)  . Contrast Media (Iodinated Diagnostic Agents) Swelling    Per echart records  . Meperidine Hcl Swelling  . Metformin And Related Other (See Comments)    'just not myself, was told not to take metformin'  . Morphine Swelling  . Nubain (Nalbuphine Hcl) Swelling  . Tobramycin Other (See Comments)    Unknown reaction  . Topiramate (Topamax) Swelling    Tongue swelling - per echart records  . Toradol (Ketorolac Tromethamine) Swelling    Medications Prior to Admission  Medication Sig Dispense Refill  . albuterol (PROVENTIL HFA;VENTOLIN HFA) 108 (90 BASE) MCG/ACT inhaler Inhale 2 puffs into the lungs every 6 (six) hours as needed. Shortness of breath      . ALPRAZolam (XANAX) 1 MG tablet Take 1 tablet (1 mg total) by mouth 3 (three) times daily as needed. For anxiety  60 tablet  0  . amoxicillin-clavulanate (AUGMENTIN) 400-57 MG/5ML suspension Take 400 mg by mouth daily. Filled 06/09/2012      . ARIPiprazole (ABILIFY) 2 MG tablet Take 10 mg by mouth daily.       Marland Kitchen b  complex-vitamin c-folic acid (NEPHRO-VITE) 0.8 MG TABS Take 0.8 mg by mouth at bedtime.      . bacitracin ophthalmic ointment Place 1 application into both eyes at bedtime. apply to eye      . butalbital-acetaminophen-caffeine (FIORICET, ESGIC) 50-325-40 MG per tablet Take 1 tablet by mouth 3 (three) times daily after meals. For headaches  45 tablet  0  . cinacalcet (SENSIPAR) 30 MG tablet Take 30 mg by mouth daily with supper.       . clopidogrel (PLAVIX) 75 MG tablet Take 75 mg by mouth daily. Last dose 12/17/2011      . darbepoetin (ARANESP) 60 MCG/0.3ML  SOLN Inject 60 mcg into the vein every Saturday with hemodialysis.      . fluticasone (FLONASE) 50 MCG/ACT nasal spray Place 1 spray into the nose daily.      . insulin glargine (LANTUS) 100 UNIT/ML injection Inject 33 Units into the skin at bedtime.      . insulin lispro (HUMALOG) 100 UNIT/ML injection Inject 7-15 Units into the skin 4 (four) times daily -  before meals and at bedtime. TAKING ON SLIDING SCALE 200-249=7 units 250-299=10 units 300-349=12 units >350=15 units      . methocarbamol (ROBAXIN) 500 MG tablet Take 500 mg by mouth 2 (two) times daily.       . metoCLOPramide (REGLAN) 10 MG tablet Take 10 mg by mouth 4 (four) times daily.      . mupirocin ointment (BACTROBAN) 2 % Apply 1 application topically daily.      Marland Kitchen ofloxacin (OCUFLOX) 0.3 % ophthalmic solution Place 1 drop into both eyes 3 (three) times daily.       Marland Kitchen omeprazole (PRILOSEC) 40 MG capsule Take 40 mg by mouth daily.       Marland Kitchen oxyCODONE-acetaminophen (PERCOCET) 10-325 MG per tablet Take 1 tablet by mouth every 4 (four) hours as needed. For pain      . PARoxetine (PAXIL) 40 MG tablet Take 80 mg by mouth every morning.       . promethazine (PHENERGAN) 25 MG tablet Take 25 mg by mouth every 6 (six) hours as needed. For nausea      . pyridoxine (B-6) 100 MG tablet Take 100 mg by mouth daily.      . sevelamer (RENVELA) 800 MG tablet Take 1,600-4,000 mg by mouth 5 (five)  times daily. *Take 5 tablets daily with each meal and take 2 tablets with snacks**      . furosemide (LASIX) 40 MG tablet Take 40 mg by mouth daily.         Results for orders placed during the hospital encounter of 06/17/12 (from the past 48 hour(s))  BASIC METABOLIC PANEL     Status: Abnormal   Collection Time   06/17/12 11:48 AM      Component Value Range Comment   Sodium 125 (*) 135 - 145 mEq/L    Potassium 6.2 (*) 3.5 - 5.1 mEq/L    Chloride 86 (*) 96 - 112 mEq/L    CO2 17 (*) 19 - 32 mEq/L    Glucose, Bld 657 (*) 70 - 99 mg/dL    BUN 85 (*) 6 - 23 mg/dL    Creatinine, Ser 9.60 (*) 0.50 - 1.10 mg/dL    Calcium 9.6  8.4 - 45.4 mg/dL    GFR calc non Af Amer 10 (*) >90 mL/min    GFR calc Af Amer 11 (*) >90 mL/min   CBC WITH DIFFERENTIAL     Status: Abnormal   Collection Time   06/17/12 11:48 AM      Component Value Range Comment   WBC 20.9 (*) 4.0 - 10.5 K/uL    RBC 3.28 (*) 3.87 - 5.11 MIL/uL    Hemoglobin 9.8 (*) 12.0 - 15.0 g/dL    HCT 09.8 (*) 11.9 - 46.0 %    MCV 97.6  78.0 - 100.0 fL    MCH 29.9  26.0 - 34.0 pg    MCHC 30.6  30.0 - 36.0 g/dL    RDW 14.7 (*) 82.9 - 15.5 %    Platelets 536 (*) 150 - 400 K/uL  Neutrophils Relative 95 (*) 43 - 77 %    Neutro Abs 19.9 (*) 1.7 - 7.7 K/uL    Lymphocytes Relative 3 (*) 12 - 46 %    Lymphs Abs 0.7  0.7 - 4.0 K/uL    Monocytes Relative 1 (*) 3 - 12 %    Monocytes Absolute 0.3  0.1 - 1.0 K/uL    Eosinophils Relative 0  0 - 5 %    Eosinophils Absolute 0.0  0.0 - 0.7 K/uL    Basophils Relative 0  0 - 1 %    Basophils Absolute 0.1  0.0 - 0.1 K/uL   LACTIC ACID, PLASMA     Status: Normal   Collection Time   06/17/12 11:48 AM      Component Value Range Comment   Lactic Acid, Venous 1.3  0.5 - 2.2 mmol/L   GLUCOSE, CAPILLARY     Status: Abnormal   Collection Time   06/17/12 12:19 PM      Component Value Range Comment   Glucose-Capillary 555 (*) 70 - 99 mg/dL    Comment 1 Notify RN      Comment 2 Documented in Chart       GLUCOSE, CAPILLARY     Status: Abnormal   Collection Time   06/17/12  1:59 PM      Component Value Range Comment   Glucose-Capillary 475 (*) 70 - 99 mg/dL    Comment 1 Notify RN      Comment 2 Documented in Chart     GLUCOSE, CAPILLARY     Status: Abnormal   Collection Time   06/17/12  3:20 PM      Component Value Range Comment   Glucose-Capillary 470 (*) 70 - 99 mg/dL   GLUCOSE, CAPILLARY     Status: Abnormal   Collection Time   06/17/12  4:29 PM      Component Value Range Comment   Glucose-Capillary 445 (*) 70 - 99 mg/dL   PRO B NATRIURETIC PEPTIDE     Status: Abnormal   Collection Time   06/17/12  4:33 PM      Component Value Range Comment   Pro B Natriuretic peptide (BNP) 16777.0 (*) 0 - 125 pg/mL   GLUCOSE, CAPILLARY     Status: Abnormal   Collection Time   06/17/12  5:27 PM      Component Value Range Comment   Glucose-Capillary 412 (*) 70 - 99 mg/dL    Comment 1 Notify RN     MRSA PCR SCREENING     Status: Normal   Collection Time   06/17/12  6:09 PM      Component Value Range Comment   MRSA by PCR NEGATIVE  NEGATIVE   GLUCOSE, CAPILLARY     Status: Abnormal   Collection Time   06/17/12  6:36 PM      Component Value Range Comment   Glucose-Capillary 366 (*) 70 - 99 mg/dL   CBC     Status: Abnormal   Collection Time   06/17/12  6:42 PM      Component Value Range Comment   WBC 24.9 (*) 4.0 - 10.5 K/uL    RBC 3.31 (*) 3.87 - 5.11 MIL/uL    Hemoglobin 9.9 (*) 12.0 - 15.0 g/dL    HCT 16.1 (*) 09.6 - 46.0 %    MCV 96.7  78.0 - 100.0 fL    MCH 29.9  26.0 - 34.0 pg    MCHC 30.9  30.0 -  36.0 g/dL    RDW 96.0 (*) 45.4 - 15.5 %    Platelets 604 (*) 150 - 400 K/uL   HEMOGLOBIN A1C     Status: Abnormal   Collection Time   06/17/12  6:42 PM      Component Value Range Comment   Hemoglobin A1C 7.4 (*) <5.7 %    Mean Plasma Glucose 166 (*) <117 mg/dL   BASIC METABOLIC PANEL     Status: Abnormal   Collection Time   06/17/12  6:42 PM      Component Value Range Comment    Sodium 127 (*) 135 - 145 mEq/L    Potassium 5.4 (*) 3.5 - 5.1 mEq/L    Chloride 88 (*) 96 - 112 mEq/L    CO2 16 (*) 19 - 32 mEq/L    Glucose, Bld 409 (*) 70 - 99 mg/dL    BUN 87 (*) 6 - 23 mg/dL    Creatinine, Ser 0.98 (*) 0.50 - 1.10 mg/dL    Calcium 9.8  8.4 - 11.9 mg/dL    GFR calc non Af Amer 9 (*) >90 mL/min    GFR calc Af Amer 11 (*) >90 mL/min   GLUCOSE, CAPILLARY     Status: Abnormal   Collection Time   06/17/12  7:29 PM      Component Value Range Comment   Glucose-Capillary 302 (*) 70 - 99 mg/dL    Comment 1 Notify RN      Comment 2 Documented in Chart     GLUCOSE, CAPILLARY     Status: Abnormal   Collection Time   06/17/12  8:40 PM      Component Value Range Comment   Glucose-Capillary 268 (*) 70 - 99 mg/dL   GLUCOSE, CAPILLARY     Status: Abnormal   Collection Time   06/17/12  9:32 PM      Component Value Range Comment   Glucose-Capillary 210 (*) 70 - 99 mg/dL    Comment 1 Notify RN      Comment 2 Documented in Chart     BASIC METABOLIC PANEL     Status: Abnormal   Collection Time   06/17/12 10:05 PM      Component Value Range Comment   Sodium 129 (*) 135 - 145 mEq/L    Potassium 5.3 (*) 3.5 - 5.1 mEq/L    Chloride 90 (*) 96 - 112 mEq/L    CO2 20  19 - 32 mEq/L    Glucose, Bld 210 (*) 70 - 99 mg/dL    BUN 89 (*) 6 - 23 mg/dL    Creatinine, Ser 1.47 (*) 0.50 - 1.10 mg/dL    Calcium 9.8  8.4 - 82.9 mg/dL    GFR calc non Af Amer 9 (*) >90 mL/min    GFR calc Af Amer 10 (*) >90 mL/min   CBC     Status: Abnormal   Collection Time   06/17/12 10:05 PM      Component Value Range Comment   WBC 25.0 (*) 4.0 - 10.5 K/uL    RBC 3.18 (*) 3.87 - 5.11 MIL/uL    Hemoglobin 9.5 (*) 12.0 - 15.0 g/dL    HCT 56.2 (*) 13.0 - 46.0 %    MCV 96.5  78.0 - 100.0 fL    MCH 29.9  26.0 - 34.0 pg    MCHC 30.9  30.0 - 36.0 g/dL    RDW 86.5 (*) 78.4 - 15.5 %    Platelets 559 (*)  150 - 400 K/uL   GLUCOSE, CAPILLARY     Status: Abnormal   Collection Time   06/17/12 10:43 PM       Component Value Range Comment   Glucose-Capillary 142 (*) 70 - 99 mg/dL   GLUCOSE, CAPILLARY     Status: Abnormal   Collection Time   06/17/12 11:53 PM      Component Value Range Comment   Glucose-Capillary 112 (*) 70 - 99 mg/dL   BASIC METABOLIC PANEL     Status: Abnormal   Collection Time   06/18/12 12:20 AM      Component Value Range Comment   Sodium 131 (*) 135 - 145 mEq/L    Potassium 5.5 (*) 3.5 - 5.1 mEq/L    Chloride 92 (*) 96 - 112 mEq/L    CO2 18 (*) 19 - 32 mEq/L    Glucose, Bld 120 (*) 70 - 99 mg/dL    BUN 91 (*) 6 - 23 mg/dL    Creatinine, Ser 1.61 (*) 0.50 - 1.10 mg/dL    Calcium 9.7  8.4 - 09.6 mg/dL    GFR calc non Af Amer 9 (*) >90 mL/min    GFR calc Af Amer 10 (*) >90 mL/min   GLUCOSE, CAPILLARY     Status: Normal   Collection Time   06/18/12 12:58 AM      Component Value Range Comment   Glucose-Capillary 99  70 - 99 mg/dL   GLUCOSE, CAPILLARY     Status: Abnormal   Collection Time   06/18/12  1:54 AM      Component Value Range Comment   Glucose-Capillary 114 (*) 70 - 99 mg/dL   COMPREHENSIVE METABOLIC PANEL     Status: Abnormal   Collection Time   06/18/12  2:00 AM      Component Value Range Comment   Sodium 130 (*) 135 - 145 mEq/L    Potassium 6.5 (*) 3.5 - 5.1 mEq/L    Chloride 91 (*) 96 - 112 mEq/L    CO2 17 (*) 19 - 32 mEq/L    Glucose, Bld 110 (*) 70 - 99 mg/dL    BUN 93 (*) 6 - 23 mg/dL    Creatinine, Ser 0.45 (*) 0.50 - 1.10 mg/dL    Calcium 9.7  8.4 - 40.9 mg/dL    Total Protein 7.3  6.0 - 8.3 g/dL    Albumin 2.3 (*) 3.5 - 5.2 g/dL    AST 40 (*) 0 - 37 U/L    ALT 18  0 - 35 U/L    Alkaline Phosphatase 211 (*) 39 - 117 U/L    Total Bilirubin 0.1 (*) 0.3 - 1.2 mg/dL    GFR calc non Af Amer 9 (*) >90 mL/min    GFR calc Af Amer 10 (*) >90 mL/min   CBC WITH DIFFERENTIAL     Status: Abnormal   Collection Time   06/18/12  2:00 AM      Component Value Range Comment   WBC 24.1 (*) 4.0 - 10.5 K/uL WHITE COUNT CONFIRMED ON SMEAR   RBC 3.16 (*)  3.87 - 5.11 MIL/uL    Hemoglobin 9.5 (*) 12.0 - 15.0 g/dL    HCT 81.1 (*) 91.4 - 46.0 %    MCV 95.9  78.0 - 100.0 fL    MCH 30.1  26.0 - 34.0 pg    MCHC 31.4  30.0 - 36.0 g/dL    RDW 78.2 (*) 95.6 - 15.5 %  Platelets 450 (*) 150 - 400 K/uL    Neutrophils Relative 92 (*) 43 - 77 %    Lymphocytes Relative 4 (*) 12 - 46 %    Monocytes Relative 4  3 - 12 %    Eosinophils Relative 0  0 - 5 %    Basophils Relative 0  0 - 1 %    Neutro Abs 22.1 (*) 1.7 - 7.7 K/uL    Lymphs Abs 1.0  0.7 - 4.0 K/uL    Monocytes Absolute 1.0  0.1 - 1.0 K/uL    Eosinophils Absolute 0.0  0.0 - 0.7 K/uL    Basophils Absolute 0.0  0.0 - 0.1 K/uL    RBC Morphology RARE NRBCs      WBC Morphology MILD LEFT SHIFT (1-5% METAS, OCC MYELO, OCC BANDS)     PHOSPHORUS     Status: Abnormal   Collection Time   06/18/12  2:00 AM      Component Value Range Comment   Phosphorus 9.1 (*) 2.3 - 4.6 mg/dL   MAGNESIUM     Status: Abnormal   Collection Time   06/18/12  2:00 AM      Component Value Range Comment   Magnesium 3.8 (*) 1.5 - 2.5 mg/dL   GLUCOSE, CAPILLARY     Status: Abnormal   Collection Time   06/18/12  3:01 AM      Component Value Range Comment   Glucose-Capillary 106 (*) 70 - 99 mg/dL   GLUCOSE, CAPILLARY     Status: Abnormal   Collection Time   06/18/12  4:03 AM      Component Value Range Comment   Glucose-Capillary 129 (*) 70 - 99 mg/dL   BASIC METABOLIC PANEL     Status: Abnormal   Collection Time   06/18/12  4:20 AM      Component Value Range Comment   Sodium 130 (*) 135 - 145 mEq/L    Potassium 5.9 (*) 3.5 - 5.1 mEq/L    Chloride 91 (*) 96 - 112 mEq/L    CO2 20  19 - 32 mEq/L    Glucose, Bld 141 (*) 70 - 99 mg/dL    BUN 94 (*) 6 - 23 mg/dL    Creatinine, Ser 1.61 (*) 0.50 - 1.10 mg/dL    Calcium 9.7  8.4 - 09.6 mg/dL    GFR calc non Af Amer 8 (*) >90 mL/min    GFR calc Af Amer 10 (*) >90 mL/min   GLUCOSE, CAPILLARY     Status: Abnormal   Collection Time   06/18/12  5:06 AM      Component  Value Range Comment   Glucose-Capillary 134 (*) 70 - 99 mg/dL   GLUCOSE, CAPILLARY     Status: Abnormal   Collection Time   06/18/12  6:05 AM      Component Value Range Comment   Glucose-Capillary 163 (*) 70 - 99 mg/dL   GLUCOSE, CAPILLARY     Status: Abnormal   Collection Time   06/18/12  7:08 AM      Component Value Range Comment   Glucose-Capillary 162 (*) 70 - 99 mg/dL   GLUCOSE, CAPILLARY     Status: Abnormal   Collection Time   06/18/12  7:58 AM      Component Value Range Comment   Glucose-Capillary 174 (*) 70 - 99 mg/dL    Comment 1 Documented in Chart      Comment 2 Notify RN  BASIC METABOLIC PANEL     Status: Abnormal   Collection Time   06/18/12  8:53 AM      Component Value Range Comment   Sodium 131 (*) 135 - 145 mEq/L    Potassium 5.7 (*) 3.5 - 5.1 mEq/L    Chloride 91 (*) 96 - 112 mEq/L    CO2 19  19 - 32 mEq/L    Glucose, Bld 167 (*) 70 - 99 mg/dL    BUN 94 (*) 6 - 23 mg/dL    Creatinine, Ser 1.61 (*) 0.50 - 1.10 mg/dL    Calcium 9.6  8.4 - 09.6 mg/dL    GFR calc non Af Amer 8 (*) >90 mL/min    GFR calc Af Amer 10 (*) >90 mL/min   GLUCOSE, CAPILLARY     Status: Abnormal   Collection Time   06/18/12  8:57 AM      Component Value Range Comment   Glucose-Capillary 154 (*) 70 - 99 mg/dL    Dg Chest Portable 1 View  06/17/2012  *RADIOLOGY REPORT*  Clinical Data: Hyperglycemia, dialysis patient,  PORTABLE CHEST - 1 VIEW  Comparison: 06/05/2012  Findings: Cardiomegaly is noted.  Stable dual lumen right IJ dialysis catheter position.  Central mild vascular congestion. Mild interstitial prominence bilaterally.  Mild interstitial edema cannot be excluded.  No focal infiltrate.  IMPRESSION:  Stable dual lumen right IJ dialysis catheter position.  Central mild vascular congestion.  Mild interstitial prominence bilaterally.  Mild interstitial edema cannot be excluded.  No focal infiltrate.   Original Report Authenticated By: Natasha Mead, M.D.     Review of Systems    Constitutional: Negative for fever.  Respiratory: Positive for shortness of breath. Negative for cough.   Cardiovascular: Negative for chest pain.  Gastrointestinal: Negative for nausea, vomiting and abdominal pain.  Musculoskeletal: Positive for back pain.  Neurological: Positive for weakness.    Blood pressure 182/90, pulse 98, temperature 97.7 F (36.5 C), temperature source Oral, resp. rate 9, height 5\' 10"  (1.778 m), weight 201 lb 4.5 oz (91.3 kg), last menstrual period 09/03/2008, SpO2 98.00%. Physical Exam  Constitutional: She appears well-developed and well-nourished.  Cardiovascular: Normal rate and regular rhythm.   Murmur heard. Respiratory: Effort normal and breath sounds normal. She has no wheezes.  GI: Soft. Bowel sounds are normal. There is no tenderness.  Musculoskeletal: Normal range of motion.       Sacral ulcers  Neurological:       Mental status altered slightly Minimal confusion Consented sister via phone  Psychiatric: Her behavior is normal.     Assessment/Plan ESRD R IJ cath malfunctioning Last use 10/19 and was able to get half tx Was scheduled for exchange as OP yesterday but pt was incoherent upon presentation  to Radiology; glucose over 500 Was sent to ER and admitted then Scheduled now for HD catheter exchange pts family aware of procedure benefits and risks and agreeable to proceed. Consent via phone with sister and in chart  Kayin Kettering A 06/18/2012, 10:17 AM

## 2012-06-18 NOTE — Progress Notes (Signed)
I idiscussed the care plan with Dr. Casper Harrison and the [facility] team and agree with assessment and plan as documented in the progress note for today. She had prolonged dialysis this PM    Toneka Fullen A. Sheffield Slider, MD Family Medicine Teaching Service Attending  06/18/2012 9:40 PM

## 2012-06-18 NOTE — Consult Note (Signed)
WOC consult Note Reason for Consult: Consult requested for right leg and buttocks wounds.  Mother at bedside and states that pt had a wound to right leg for 2 years which has recently healed.  Pink dry scar tissue to inner left knee and right calf.  Right calf with 1X1cm area of dry scabbing remaining.  No odor or drainage or odor, pt c/o pain when touched.  No further topical treatment needed to this site at present.  Sacrum and bilat buttocks  with previous red lesions which have ruptured and drained, according to mother at bedside.  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   THESE ARE NOT PRESSURE ULCERS. Wound type: Sacrum with full thickness wound; .2X.2X.2cm, 50% slough, 50% red.  Small yellow drainage, no odor. Left buttock with full thickness lesion; 100% slough which removed easily with forceps, revealing .2X.2X.5cm red wound with mod pink drainage, no odor. Right buttock with partial thickness lesion, 2X.5X.1cm 100% slough removes easily with forceps, revealing pink dry wound bed without odor or drainage. Plan:  Bactroban to provide antimicrobial benefits and promote healing.  Foam dressing to protect.  Air mattress to decrease discomfort.  Will not plan to follow further unless re-consulted.  [id number] RN, MSN, [employer]  [phone]

## 2012-06-18 NOTE — Progress Notes (Signed)
Family Medicine Teaching Service Daily Progress Note Intern Pager: [phone]  Patient name: [name] Medical record number: [id number] Date of birth: [birth year] Age: 41 y.o. Gender: female  Primary Care Provider: Ernestine Conrad, MD  Subjective: Pt seen at bedside this morning. Complains of pain in backs and legs and nausea, which are pt's typical complaints. Denies chest pain, shortness of breath, abdominal pain. Pt is indifferent to interview with very short answers and focuses on requests for pain medication.  Objective: Temp:  [97.4 F (36.3 C)-98.2 F (36.8 C)] 97.7 F (36.5 C) (10/23 0749) Pulse Rate:  [85-107] 98  (10/23 0749) Resp:  [9-24] 9  (10/23 0749) BP: (167-201)/(81-108) 182/90 mmHg (10/23 0749) SpO2:  [89 %-99 %] 98 % (10/23 0749) Weight:  [201 lb 4.5 oz (91.3 kg)] 201 lb 4.5 oz (91.3 kg) (10/23 0425) Exam: General: obese adult female, lying in bed on right side, responsive but indifferent and not very cooperative with exam Cardiovascular: RRR, normal S1, S2; heart sounds slightly distant secondary to body habitus  Respiratory: CTAB, scant expiratory wheezes; dependent crackles at right base (pt lying on right side)  Abdomen: soft, nontender, obese; large midline scar; ~2 cm soft, easily reducible incisional hernia; BS+ Extremities: marked bilateral 2+ pitting edema remains, from ankles to just above knees  Skin: RLE with two areas of chronic ulceration, one just below/lateral to knee, one above ankle; no change   Bilateral relatively poorly healed areas of groin at previous bypass graft sites, no change in appearance   Present on admission: Buttock ulcerations, various stages of healing/scabbing not reassessed this AM  LLE with redness, slight warmth to the touch and very tender, remains   Similar area of redness as on presentation to the ED on 10/10, but not as dark    Significant scaling of skin of LLE and foot   Laboratory:  Lab 06/18/12 0200 06/17/12 2205  06/17/12 1842  WBC 24.1* 25.0* 24.9*  HGB 9.5* 9.5* 9.9*  HCT 30.3* 30.7* 32.0*  PLT 450* 559* 604*    Lab 06/18/12 1402 06/18/12 0853 06/18/12 0420 06/18/12 0200  NA 129* 131* 130* --  K 5.3* 5.7* 5.9* --  CL 89* 91* 91* --  CO2 19 19 20  --  BUN 96* 94* 94* --  CREATININE 5.89* 5.66* 5.71* --  CALCIUM 9.4 9.6 9.7 --  PROT -- -- -- 7.3  BILITOT -- -- -- 0.1*  ALKPHOS -- -- -- 211*  ALT -- -- -- 18  AST -- -- -- 40*  GLUCOSE 202* 167* 141* --    Basename 06/18/12 1135 06/18/12 0857 06/18/12 0758  GLUCAP 193* 154* 174*   CXR, 10/22 @1132  Findings: Cardiomegaly is noted. Stable dual lumen right IJ  dialysis catheter position. Central mild vascular congestion.  Mild interstitial prominence bilaterally. Mild interstitial edema  cannot be excluded. No focal infiltrate.  IMPRESSION:  Stable dual lumen right IJ dialysis catheter position. Central  mild vascular congestion. Mild interstitial prominence  bilaterally. Mild interstitial edema cannot be excluded. No focal  infiltrate.  Assessment and Plan: OUIDA ABEYTA is a 41 y.o. year old female with a complicated PMH significant for multiple vascular procedures, generally poorly controlled DM type II also on insulin, ESRD on HD (TThS in [place], [place]), CAD/previous MI s/p stenting, presenting with elevated blood sugars after coming to the hospital for planned HD catheter change and pre-treatment with prednisone for contrast allergy. Found with hyperglycemia suggestive of DKA with CBG >600, anion gap 22, WBC  20.9, BP ~180's-190's/90's-100's; Mostly unchanged, this morning, still with overload but with improved CBG's on insulin drip overnight. Anion gap still present however pt has chronic renal failure and labs are difficult to interpret secondary to chronic electrolyte derangement and acute fluid overload. Nephrology consultants believe this reflects more of a hyperosmolar state as opposed to DKA.   #Apparent Somnolence  - Slept/snored  thorugh majority of time in ED, easily awakened. Similar behavior/appearance to previous admission; pt is often very indifferent to her current status and to questioning and is sometimes very uncooperative with exam. Pt also refusing multiple meds, which also occurred during previous admission.  #History of OSA - Adamantly refuses CPAP. CO2 monitored in ED and not elevated as expected. Patient is at high risk for becoming hypercarbic so will hold off on medications that could cause respiratory depression or smooth muscle relaxation; reduced Percocet and Xanax doses, as below.    1. DM type II with multiple complications - Poor compliance with outpt insulin/CBG regimen. Presenting with CBG >600 and gap 22, as above. Pt on Lantus 33 units qHS, Humalog SSI TID at home, with Reglan for gastroparesis. Last A1c 6.9 in July. Multiple chronic poorly-healing wounds: right LE ulcers, groin incisions from previous bypass surgery, multiple buttocks ulcers.  Plan - Transitioned to subQ insulin this morning. Continue Lantus 33 units qHS and SSI at mealtimes. CBG's qAS+HS.  2. ESRD on HD - On TThS schedule in [place], [place]. Baseline Cr appears to be ~3.5-5. Hx of multiple graft/catheter placements. Most recently/currently has right IJ catheter, but was to be changed out today; did not go to dialysis today due to scheduled change of catheter, planned to go tomorrow. Some fluid overload on exam at presentation with 2+ pitting edema of bilateral lower legs; some crackles on lung exam but likely component of dependence as pt favors lying on her right side. Fluid overload remains this morning, with 2+ pitting edema of LE and 1+ of UE. Dialysis catheter replaced this morning by IR. Pt also hyponatremic this morning, worse than mild hyponatremia on admission; pt tends to have Na at or slightly below the lower limit of normal. Plan - HD management per nephrology service. Assistance greatly appreciated. Will follow along with  labs.  3. HTN - Pt hypertensive in the ED to 200's/100's. Per pt's sister, pt has been taken off "a few different drugs" earlier this year by a doctor during one hospital admission. During previous admission to our service 10/10, pt was intially hypotensive and then normotensive after gentle hydration and subsequent dialysis, not requiring medication.  Plan - Continue hydralazine PRN for systolic <160, diastolic <100. Dialysis will hopefully help with this as well. Will defer more aggressive BP control for the time being, given hypotension during last admission and current fluid overload.  4. Cellulitis of LLE +/- chronic venous stasis - Healed/healing from previous admission with course of abx not yet complete, also with likely component of chronic venous stasis. Nearly circumferential from ankle almost to knee but does not appear as extensive as during previous admission. Appears mostly unchanged, certainly not worsened. Pt still with elevated WBC. Plan - Will continue vancomycin (pharmacy consult) and Augmentin (pt has taken 5 doses prior to admission). Per Dr. Myra Gianotti who is aware of admission, pt would likely benefit from elevation/compression; will await any further [facility] recommendations.   5. Skin care - Buttock ulcers resent on admission initially concerning for pressure ulcers of uncertain stage given amount of time pt spends in bed/wheelchair.  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 Plan - Bactroban with local wound care. Foam dressing and air mattress to reduce pressure; appreciate wound care consult. Turn patient q2 hours.   6. CAD/PAD with history of MI - S/p stent placement in 2003; cath in 2009 showed some occlusion of stent with angioplasty and subsequent medically managed restenosis. Aortobifemoral graft and above-the-knee femoral-popliteal bypass on the right leg in May 2013. Echo in 01/2012 showed EF of 60-65%  with normal systolic function and mild LVH.  Plan - Continue home Plavix. [facility] aware of admission (Dr. Myra Gianotti, pt's regular vascular surgeon). Will follow-up any recommendations.   7. Bipolar disorder/anxiety/chronic pain - Continue home Paxil, Abilify. Xanax PRN (half home dose). Percocet PRN (half home dose).   8. GERD - Protonix to replace home omeprazole. Maalox PRN.   9. Hx multiple eye surgeries - Pt with Continue home eyedrops (Polysporin, ofloxacin).   10. Hx of migraines - No current headaches. Fioricet PRN to prevent withdrawal from barbiturate.   11. Seasonal allergies - Continue home Flonase.   FEN/GI: NPO for now. Saline lock IV.  Prophylaxis/PRN: SubQ heparin. Phenergan.  Disposition: Admit to [facility]PTS, step-down unit. Discharge pending clinical improvement. SNF placement as discussed during the previous admission, due to pt's multiple comorbidities and occasional missing of HD appointments, but pt and family preferred to take pt home with home health. Will request PT/OT and CSW consults to readdress.  Code Status: Full  Shenise Wolgamott, Cristal Deer, MD 06/18/2012, 2:37 PM

## 2012-06-19 ENCOUNTER — Inpatient Hospital Stay (HOSPITAL_COMMUNITY): Payer: Medicare Other | Admitting: Critical Care Medicine

## 2012-06-19 ENCOUNTER — Encounter (HOSPITAL_COMMUNITY): Payer: Self-pay | Admitting: Critical Care Medicine

## 2012-06-19 ENCOUNTER — Inpatient Hospital Stay (HOSPITAL_COMMUNITY): Payer: Medicare Other

## 2012-06-19 DIAGNOSIS — J9601 Acute respiratory failure with hypoxia: Secondary | ICD-10-CM | POA: Diagnosis present

## 2012-06-19 DIAGNOSIS — I469 Cardiac arrest, cause unspecified: Secondary | ICD-10-CM | POA: Diagnosis present

## 2012-06-19 DIAGNOSIS — E872 Acidosis, unspecified: Secondary | ICD-10-CM

## 2012-06-19 DIAGNOSIS — R579 Shock, unspecified: Secondary | ICD-10-CM | POA: Diagnosis present

## 2012-06-19 DIAGNOSIS — E875 Hyperkalemia: Secondary | ICD-10-CM | POA: Diagnosis present

## 2012-06-19 LAB — BASIC METABOLIC PANEL
BUN: 100 mg/dL — ABNORMAL HIGH (ref 6–23)
BUN: 112 mg/dL — ABNORMAL HIGH (ref 6–23)
BUN: 118 mg/dL — ABNORMAL HIGH (ref 6–23)
CO2: 19 mEq/L (ref 19–32)
CO2: 12 mEq/L — ABNORMAL LOW (ref 19–32)
Calcium: 8.3 mg/dL — ABNORMAL LOW (ref 8.4–10.5)
Calcium: 8.4 mg/dL (ref 8.4–10.5)
Calcium: 8.5 mg/dL (ref 8.4–10.5)
Chloride: 98 mEq/L (ref 96–112)
Chloride: 95 mEq/L — ABNORMAL LOW (ref 96–112)
Creatinine, Ser: 4.93 mg/dL — ABNORMAL HIGH (ref 0.50–1.10)
GFR calc Af Amer: 11 mL/min — ABNORMAL LOW (ref >90)
GFR calc Af Amer: 12 mL/min — ABNORMAL LOW (ref >90)
GFR calc Af Amer: 12 mL/min — ABNORMAL LOW (ref >90)
GFR calc non Af Amer: 10 mL/min — ABNORMAL LOW (ref >90)
GFR calc non Af Amer: 11 mL/min — ABNORMAL LOW (ref >90)
GFR calc non Af Amer: 9 mL/min — ABNORMAL LOW (ref >90)
Glucose, Bld: 308 mg/dL — ABNORMAL HIGH (ref 70–99)
Glucose, Bld: 329 mg/dL — ABNORMAL HIGH (ref 70–99)
Glucose, Bld: 440 mg/dL — ABNORMAL HIGH (ref 70–99)
Potassium: 5.4 mEq/L — ABNORMAL HIGH (ref 3.5–5.1)
Potassium: 6.4 mEq/L (ref 3.5–5.1)
Potassium: 6.8 mEq/L (ref 3.5–5.1)
Sodium: 136 mEq/L (ref 135–145)
Sodium: 138 mEq/L (ref 135–145)

## 2012-06-19 LAB — CLOSTRIDIUM DIFFICILE BY PCR: Toxigenic C. Difficile by PCR: NEGATIVE

## 2012-06-19 LAB — CBC
HCT: 17.8 % — ABNORMAL LOW (ref 36.0–46.0)
HCT: 24.2 % — ABNORMAL LOW (ref 36.0–46.0)
HCT: 23.1 % — ABNORMAL LOW (ref 36.0–46.0)
Hemoglobin: 5.4 g/dL — CL (ref 12.0–15.0)
Hemoglobin: 7.8 g/dL — ABNORMAL LOW (ref 12.0–15.0)
MCH: 29 pg (ref 26.0–34.0)
MCH: 30.3 pg (ref 26.0–34.0)
MCH: 29.7 pg (ref 26.0–34.0)
MCHC: 30.3 g/dL (ref 30.0–36.0)
MCHC: 31 g/dL (ref 30.0–36.0)
MCHC: 33.8 g/dL (ref 30.0–36.0)
MCV: 95.7 fL (ref 78.0–100.0)
MCV: 91 fL (ref 78.0–100.0)
MCV: 87.8 fL (ref 78.0–100.0)
Platelets: 504 10*3/uL — ABNORMAL HIGH (ref 150–400)
Platelets: 489 10*3/uL — ABNORMAL HIGH (ref 150–400)
Platelets: 402 10*3/uL — ABNORMAL HIGH (ref 150–400)
RBC: 1.86 MIL/uL — ABNORMAL LOW (ref 3.87–5.11)
RBC: 2.66 MIL/uL — ABNORMAL LOW (ref 3.87–5.11)
RDW: 21.6 % — ABNORMAL HIGH (ref 11.5–15.5)
RDW: 21.2 % — ABNORMAL HIGH (ref 11.5–15.5)
WBC: 16.1 10*3/uL — ABNORMAL HIGH (ref 4.0–10.5)
WBC: 35.7 10*3/uL — ABNORMAL HIGH (ref 4.0–10.5)

## 2012-06-19 LAB — RENAL FUNCTION PANEL
Albumin: 1.8 g/dL — ABNORMAL LOW (ref 3.5–5.2)
Albumin: 1.8 g/dL — ABNORMAL LOW (ref 3.5–5.2)
BUN: 132 mg/dL — ABNORMAL HIGH (ref 6–23)
CO2: 19 mEq/L (ref 19–32)
Calcium: 8.8 mg/dL (ref 8.4–10.5)
Chloride: 93 mEq/L — ABNORMAL LOW (ref 96–112)
Chloride: 98 mEq/L (ref 96–112)
GFR calc Af Amer: 12 mL/min — ABNORMAL LOW (ref >90)
GFR calc non Af Amer: 10 mL/min — ABNORMAL LOW (ref >90)
Glucose, Bld: 632 mg/dL (ref 70–99)
Potassium: 6 mEq/L — ABNORMAL HIGH (ref 3.5–5.1)
Sodium: 140 mEq/L (ref 135–145)

## 2012-06-19 LAB — GLUCOSE, CAPILLARY
Glucose-Capillary: >600 mg/dL (ref 70–99)
Glucose-Capillary: 384 mg/dL — ABNORMAL HIGH (ref 70–99)
Glucose-Capillary: 381 mg/dL — ABNORMAL HIGH (ref 70–99)
Glucose-Capillary: 425 mg/dL — ABNORMAL HIGH (ref 70–99)

## 2012-06-19 LAB — HEPATITIS B CORE ANTIBODY, TOTAL: Hep B Core Total Ab: NEGATIVE

## 2012-06-19 LAB — POCT I-STAT 3, ART BLOOD GAS (G3+)
Acid-base deficit: 9 mmol/L — ABNORMAL HIGH (ref 0.0–2.0)
Bicarbonate: 16.5 mEq/L — ABNORMAL LOW (ref 20.0–24.0)
pO2, Arterial: 331 mmHg — ABNORMAL HIGH (ref 80.0–100.0)

## 2012-06-19 LAB — PREPARE RBC (CROSSMATCH)

## 2012-06-19 LAB — VANCOMYCIN, RANDOM: Vancomycin Rm: 26.3 ug/mL

## 2012-06-19 LAB — APTT: aPTT: 42 seconds — ABNORMAL HIGH (ref 24–37)

## 2012-06-19 LAB — PROTIME-INR: INR: 2.06 — ABNORMAL HIGH (ref 0.00–1.49)

## 2012-06-19 LAB — KETONES, QUALITATIVE

## 2012-06-19 MED ORDER — PRISMASOL BGK 0/2.5 32-2.5 MEQ/L IV SOLN
INTRAVENOUS | Status: DC
  Administered 2012-06-19: 13:00:00 via INTRAVENOUS_CENTRAL
  Filled 2012-06-19 (×4): qty 5000

## 2012-06-19 MED ORDER — VANCOMYCIN HCL IN DEXTROSE 1-5 GM/200ML-% IV SOLN
1000.0000 mg | INTRAVENOUS | Status: AC
  Administered 2012-06-19 – 2012-06-24 (×6): 1000 mg via INTRAVENOUS
  Filled 2012-06-19 (×6): qty 200

## 2012-06-19 MED ORDER — DEXTROSE-NACL 5-0.45 % IV SOLN: INTRAVENOUS | Status: DC

## 2012-06-19 MED ORDER — HEPARIN SODIUM (PORCINE) 1000 UNIT/ML DIALYSIS
1000.0000 [IU] | INTRAMUSCULAR | Status: DC | PRN
  Administered 2012-06-19: 4000 [IU] via INTRAVENOUS_CENTRAL
  Administered 2012-06-22: 3600 [IU] via INTRAVENOUS_CENTRAL
  Administered 2012-06-24: 2600 [IU] via INTRAVENOUS_CENTRAL
  Filled 2012-06-19: qty 1
  Filled 2012-06-19 (×2): qty 4
  Filled 2012-06-19 (×2): qty 3

## 2012-06-19 MED ORDER — PRISMASOL BGK 0/2.5 32-2.5 MEQ/L IV SOLN
INTRAVENOUS | Status: DC
  Administered 2012-06-19 – 2012-06-20 (×2): via INTRAVENOUS_CENTRAL
  Filled 2012-06-19 (×5): qty 5000

## 2012-06-19 MED ORDER — HEPARIN SODIUM (PORCINE) 1000 UNIT/ML DIALYSIS: 20.0000 [IU]/kg | INTRAMUSCULAR | Status: DC | PRN

## 2012-06-19 MED ORDER — SODIUM CHLORIDE 0.9 % FOR CRRT
INTRAVENOUS_CENTRAL | Status: DC | PRN
  Filled 2012-06-19: qty 1000

## 2012-06-19 MED ORDER — DEXTROSE 5 % IV SOLN
Status: DC
  Administered 2012-06-19 – 2012-06-21 (×6): via INTRAVENOUS_CENTRAL
  Filled 2012-06-19 (×11): qty 1500

## 2012-06-19 MED ORDER — BIOTENE DRY MOUTH MT LIQD
15.0000 mL | Freq: Four times a day (QID) | OROMUCOSAL | Status: DC
  Administered 2012-06-20 – 2012-06-26 (×17): 15 mL via OROMUCOSAL

## 2012-06-19 MED ORDER — PIPERACILLIN-TAZOBACTAM 3.375 G IVPB 30 MIN
3.3750 g | Freq: Four times a day (QID) | INTRAVENOUS | Status: DC
  Administered 2012-06-19 – 2012-06-23 (×16): 3.375 g via INTRAVENOUS
  Filled 2012-06-19 (×19): qty 50

## 2012-06-19 MED ORDER — INSULIN ASPART 100 UNIT/ML ~~LOC~~ SOLN: 10.0000 [IU] | Freq: Once | SUBCUTANEOUS | Status: DC

## 2012-06-19 MED ORDER — FENTANYL CITRATE 0.05 MG/ML IJ SOLN
50.0000 ug | INTRAMUSCULAR | Status: DC | PRN
  Administered 2012-06-19 (×3): 50 ug via INTRAVENOUS
  Administered 2012-06-20: 100 ug via INTRAVENOUS
  Administered 2012-06-20: 50 ug via INTRAVENOUS
  Administered 2012-06-20 (×2): 100 ug via INTRAVENOUS
  Administered 2012-06-21 – 2012-06-23 (×11): 50 ug via INTRAVENOUS
  Administered 2012-06-23 (×2): 100 ug via INTRAVENOUS
  Administered 2012-06-23: 50 ug via INTRAVENOUS
  Administered 2012-06-23: 100 ug via INTRAVENOUS
  Administered 2012-06-23: 50 ug via INTRAVENOUS
  Administered 2012-06-23: 100 ug via INTRAVENOUS
  Administered 2012-06-23 (×4): 50 ug via INTRAVENOUS
  Administered 2012-06-24 (×3): 100 ug via INTRAVENOUS
  Filled 2012-06-19 (×27): qty 2

## 2012-06-19 MED ORDER — SODIUM CHLORIDE 0.9 % IV SOLN
INTRAVENOUS | Status: DC
  Administered 2012-06-19: 1000 mL via INTRAVENOUS

## 2012-06-19 MED ORDER — NOREPINEPHRINE BITARTRATE 1 MG/ML IJ SOLN
2.0000 ug/min | INTRAVENOUS | Status: DC
  Administered 2012-06-19: 10 ug/min via INTRAVENOUS
  Administered 2012-06-20: 12 ug/min via INTRAVENOUS
  Filled 2012-06-19 (×2): qty 16

## 2012-06-19 MED ORDER — NOREPINEPHRINE BITARTRATE 1 MG/ML IJ SOLN
2.0000 ug/min | INTRAVENOUS | Status: DC
  Administered 2012-06-19: 5 ug/min via INTRAVENOUS
  Filled 2012-06-19: qty 4

## 2012-06-19 MED ORDER — ALTEPLASE 2 MG IJ SOLR
2.0000 mg | Freq: Once | INTRAMUSCULAR | Status: DC
  Filled 2012-06-19: qty 2

## 2012-06-19 MED ORDER — HEPARIN SODIUM (PORCINE) 1000 UNIT/ML DIALYSIS: 1000.0000 [IU] | INTRAMUSCULAR | Status: DC | PRN

## 2012-06-19 MED ORDER — MIDAZOLAM HCL 2 MG/2ML IJ SOLN
2.0000 mg | Freq: Once | INTRAMUSCULAR | Status: AC
  Administered 2012-06-19: 2 mg via INTRAVENOUS
  Filled 2012-06-19: qty 2

## 2012-06-19 MED ORDER — PRISMASOL BGK 0/2.5 32-2.5 MEQ/L IV SOLN
INTRAVENOUS | Status: DC
  Administered 2012-06-19 – 2012-06-20 (×3): via INTRAVENOUS_CENTRAL
  Filled 2012-06-19 (×8): qty 5000

## 2012-06-19 MED ORDER — SODIUM CHLORIDE 0.9 % IV SOLN
80.0000 mg | Freq: Once | INTRAVENOUS | Status: AC
  Administered 2012-06-19: 80 mg via INTRAVENOUS
  Filled 2012-06-19: qty 80

## 2012-06-19 MED ORDER — SODIUM BICARBONATE 8.4 % IV SOLN
INTRAVENOUS | Status: DC
  Administered 2012-06-19: 09:00:00 via INTRAVENOUS
  Filled 2012-06-19 (×7): qty 150

## 2012-06-19 MED ORDER — CHLORHEXIDINE GLUCONATE 0.12 % MT SOLN
15.0000 mL | Freq: Two times a day (BID) | OROMUCOSAL | Status: DC
  Administered 2012-06-19 – 2012-06-22 (×6): 15 mL via OROMUCOSAL
  Filled 2012-06-19 (×7): qty 15

## 2012-06-19 MED ORDER — SODIUM CHLORIDE 0.9 % IV SOLN
8.0000 mg/h | INTRAVENOUS | Status: DC
  Administered 2012-06-19 – 2012-06-22 (×5): 8 mg/h via INTRAVENOUS
  Filled 2012-06-19 (×15): qty 80

## 2012-06-19 MED ORDER — DEXTROSE 5 % IV SOLN
20.0000 g | INTRAVENOUS | Status: DC
  Administered 2012-06-19 – 2012-06-21 (×3): 20 g via INTRAVENOUS_CENTRAL
  Filled 2012-06-19 (×6): qty 200

## 2012-06-19 MED ORDER — SODIUM CHLORIDE 0.9 % IV SOLN
INTRAVENOUS | Status: DC
  Administered 2012-06-19: 4.4 [IU]/h via INTRAVENOUS
  Administered 2012-06-19: 12.6 [IU]/h via INTRAVENOUS
  Filled 2012-06-19 (×2): qty 1

## 2012-06-19 MED ORDER — VANCOMYCIN HCL IN DEXTROSE 1-5 GM/200ML-% IV SOLN
1000.0000 mg | Freq: Once | INTRAVENOUS | Status: DC
  Filled 2012-06-19: qty 200

## 2012-06-19 MED ORDER — DEXTROSE 50 % IV SOLN: 25.0000 mL | INTRAVENOUS | Status: DC | PRN

## 2012-06-19 NOTE — Consult Note (Signed)
Name: [name] MRN: [id number] DOB: 07-11-1971    LOS: 2 PCP is Ernestine Conrad, MD REferring: FPTS PULMONARY / CRITICAL CARE MEDICINE  HPI:  41 year old female ESRD patient Tue/TH/Sat HD, who was scheduled for HD graft revision on 10/22. During preop workup was found with a blood sugar greater than 600, a anion gap of 22, and was referred to the ER for further eval. She was treated for hyperglycemia and possible DKA. Had her right IJ HD cath exchanged on 10/23. Refused part of her HD that evening, stopping early. Then suddenly on 06/19/12: patient arrested on 3300 on 10/24 and was sent to the ICU with hyperkalemia and possible GI bleed. The patient has a history of coronary artery disease, peripheral artery disease, end-stage renal disease, on hemodialysis T Th Sat, diabetes, and was recently treated for left lower extremity cellulitis on 10/10.  Past Medical History  Diagnosis Date  . DVT (deep venous thrombosis)     DVT HISTORY  . Diabetes mellitus   . Migraine     HISTORY  . Anxiety and depression   . CAD (coronary artery disease)     Last cath 2009:  LAD stent 80% stenosis. Circumflex 60-70% stenosis AV groove, right coronary artery 50-60% stenosis. She did have cutting balloon angioplasty of the LAD lesion into a diagonal. However, there was restenosis of this and it was managed medically.  . Bipolar 1 disorder   . Hyperlipidemia   . Hyperparathyroidism   . PE (pulmonary embolism)     HISTORY  . Dialysis patient   . Anemia   . Renal failure     dialysis [place] t,th sat  . Nephrotic syndrome   . Peripheral vascular disease   . MRSA infection   . Anxiety   . Depression   . Gastroparesis   . Complication of anesthesia   . PONV (postoperative nausea and vomiting)     x3 days post anesth. on 12/19/2011  . Myocardial infarction     x3 , last one - 10 yrs. ago  . Hypertension     sees Dr. Antoine Poche - [place], last stress test 11/2011, see result in EPIC, saw Dr. Antoine Poche as well  at that time    . Shortness of breath   . Recurrent upper respiratory infection (URI)     Bronchitis 11/2011 - saw Dr. Loney Hering, treating currently /w antibiotic   . GERD (gastroesophageal reflux disease)   . Pneumonia     [facility]- 2012  . Noncompliance of patient with renal dialysis   . Cellulitis Oct. 2013  . MRSA (methicillin resistant staph aureus) culture positive Oct 2013   Past Surgical History  Procedure Date  . Incise and drain abcess     OF THIGHS FROM INSULIN INJECTIONS  . Heart stents   . Portacath placement 2003  . Cataract extraction w/phaco 11/09/2011    Procedure: CATARACT EXTRACTION PHACO AND INTRAOCULAR LENS PLACEMENT (IOC);  Surgeon: Edmon Crape, MD;  Location: [facility] OR;  Service: Ophthalmology;  Laterality: Right;  . Pars plana vitrectomy 11/09/2011    Procedure: PARS PLANA VITRECTOMY WITH 23 GAUGE;  Surgeon: Edmon Crape, MD;  Location: [facility] OR;  Service: Ophthalmology;  Laterality: Right;  Ahmed Valve; Scleral Reinforcement Graft  . Dg av dialysis  shunt access exist*l* or 12/13/11    left arm  . Dialysis cath inserted & portacath      dialysis catheter inserted & portacath d/c'd- 12/19/2011  . Back surgery X 4  x4 times   . Cholecystectomy   . Eye surgery   . Cardiac catheterization   . Aorta - bilateral femoral artery bypass graft 12/28/2011    Procedure: AORTA BIFEMORAL BYPASS GRAFT;  Surgeon: Nada Libman, MD;  Location: [facility] OR;  Service: Vascular;  Laterality: N/A;  AortoBifemoral Bypass using hemasheild 14x7 graft  . Femoral-popliteal bypass graft 12/28/2011    Procedure: BYPASS GRAFT FEMORAL-POPLITEAL ARTERY;  Surgeon: Nada Libman, MD;  Location: [facility] OR;  Service: Vascular;  Laterality: Right;  right femoral popliteal bypass using 6x80 propaten graft  . Insertion of dialysis catheter 02/15/2012    Procedure: INSERTION OF DIALYSIS CATHETER;  Surgeon: Chuck Hint, MD;  Location: [facility] OR;  Service: Vascular;  Laterality: Right;  insertion of dialysis catheter  right internal jugular vein,removal of left internal jugular vein dialysis catheter  . Insertion of dialysis catheter 02/21/2012    Procedure: INSERTION OF DIALYSIS CATHETER;  Surgeon: Chuck Hint, MD;  Location: [facility] OR;  Service: Cardiovascular;  Laterality: N/A;  Insertion new dialysis catheter; possible venoplasty of fibrin sheath of  Right IJ catheter  . Venogram 02/21/2012    Procedure: VENOGRAM;  Surgeon: Chuck Hint, MD;  Location: [facility] OR;  Service: Cardiovascular;  Laterality: N/A;  possible venoplasty  . Insertion of dialysis catheter 04/23/2012    Procedure: INSERTION OF DIALYSIS CATHETER;  Surgeon: Sherren Kerns, MD;  Location: [facility] OR;  Service: Vascular;  Laterality: Right;  using a 89fr. x 23 cannon catheter in Right Internal Jugular   Prior to Admission medications   Medication Sig Start Date End Date Taking? Authorizing Provider  albuterol (PROVENTIL HFA;VENTOLIN HFA) 108 (90 BASE) MCG/ACT inhaler Inhale 2 puffs into the lungs every 6 (six) hours as needed. Shortness of breath   Yes Historical Provider, MD  ALPRAZolam (XANAX) 1 MG tablet Take 1 tablet (1 mg total) by mouth 3 (three) times daily as needed. For anxiety 05/05/12  Yes Dorothea Ogle, MD  amoxicillin-clavulanate (AUGMENTIN) 400-57 MG/5ML suspension Take 400 mg by mouth daily. Filled 06/09/2012   Yes Historical Provider, MD  ARIPiprazole (ABILIFY) 2 MG tablet Take 10 mg by mouth daily.    Yes Historical Provider, MD  b complex-vitamin c-folic acid (NEPHRO-VITE) 0.8 MG TABS Take 0.8 mg by mouth at bedtime.   Yes Historical Provider, MD  bacitracin ophthalmic ointment Place 1 application into both eyes at bedtime. apply to eye   Yes Historical Provider, MD  butalbital-acetaminophen-caffeine (FIORICET, ESGIC) 50-325-40 MG per tablet Take 1 tablet by mouth 3 (three) times daily after meals. For headaches 05/05/12  Yes Dorothea Ogle, MD  cinacalcet (SENSIPAR) 30 MG tablet Take 30 mg by mouth daily with supper.    Yes  Historical Provider, MD  clopidogrel (PLAVIX) 75 MG tablet Take 75 mg by mouth daily. Last dose 12/17/2011   Yes Historical Provider, MD  darbepoetin (ARANESP) 60 MCG/0.3ML SOLN Inject 60 mcg into the vein every Saturday with hemodialysis. 02/23/12  Yes Lonia Blood, MD  fluticasone (FLONASE) 50 MCG/ACT nasal spray Place 1 spray into the nose daily.   Yes Historical Provider, MD  insulin glargine (LANTUS) 100 UNIT/ML injection Inject 33 Units into the skin at bedtime. 05/05/12  Yes Dorothea Ogle, MD  insulin lispro (HUMALOG) 100 UNIT/ML injection Inject 7-15 Units into the skin 4 (four) times daily -  before meals and at bedtime. TAKING ON SLIDING SCALE 200-249=7 units 250-299=10 units 300-349=12 units >350=15 units   Yes Historical Provider, MD  methocarbamol (ROBAXIN) 500 MG tablet Take 500 mg by mouth 2 (two) times daily.  04/07/12  Yes Historical Provider, MD  metoCLOPramide (REGLAN) 10 MG tablet Take 10 mg by mouth 4 (four) times daily.   Yes Historical Provider, MD  mupirocin ointment (BACTROBAN) 2 % Apply 1 application topically daily.   Yes Historical Provider, MD  ofloxacin (OCUFLOX) 0.3 % ophthalmic solution Place 1 drop into both eyes 3 (three) times daily.  05/29/12  Yes Historical Provider, MD  omeprazole (PRILOSEC) 40 MG capsule Take 40 mg by mouth daily.  11/12/11  Yes Malissa Hippo, MD  oxyCODONE-acetaminophen (PERCOCET) 10-325 MG per tablet Take 1 tablet by mouth every 4 (four) hours as needed. For pain   Yes Historical Provider, MD  PARoxetine (PAXIL) 40 MG tablet Take 80 mg by mouth every morning.    Yes Historical Provider, MD  promethazine (PHENERGAN) 25 MG tablet Take 25 mg by mouth every 6 (six) hours as needed. For nausea   Yes Historical Provider, MD  pyridoxine (B-6) 100 MG tablet Take 100 mg by mouth daily.   Yes Historical Provider, MD  sevelamer (RENVELA) 800 MG tablet Take 1,600-4,000 mg by mouth 5 (five) times daily. *Take 5 tablets daily with each meal and take 2  tablets with snacks**   Yes Historical Provider, MD  furosemide (LASIX) 40 MG tablet Take 40 mg by mouth daily.  04/07/12   Historical Provider, MD   Allergies Allergies  Allergen Reactions  . Compazine (Prochlorperazine) Swelling    Per echart records (takes promethazine at home)  . Contrast Media (Iodinated Diagnostic Agents) Swelling    Per echart records  . Meperidine Hcl Swelling  . Metformin And Related Other (See Comments)    'just not myself, was told not to take metformin'  . Morphine Swelling  . Nubain (Nalbuphine Hcl) Swelling  . Tobramycin Other (See Comments)    Unknown reaction  . Topiramate (Topamax) Swelling    Tongue swelling - per echart records  . Toradol (Ketorolac Tromethamine) Swelling    Family History Family History  Problem Relation Age of Onset  . Transient ischemic attack Mother   . Hepatitis Sister   . Diabetes type II Other   . Anesthesia problems Neg Hx   . Hypotension Neg Hx   . Malignant hyperthermia Neg Hx   . Pseudochol deficiency Neg Hx    Social History  reports that she has been smoking Cigarettes.  She has a 60 pack-year smoking history. She has never used smokeless tobacco. She reports that she does not drink alcohol or use illicit drugs.  Review Of Systems:  Patient was coated status post cardiac arrest. Patient's heart rate decreased to the 20s had CPR performed was treated for possible GI bleeding with 1 unit of packed blood cells and was also treated for hyperkalemia and acidosis.  Brief patient description:  41 year old female who was scheduled for HD graft revision on 10/22. During preop workup was found with a blood sugar greater than 600, a anion gap of 22, and was referred to the ER for further eval. She was treated for hyperglycemia and possible DKA. Had her right IJ HD cath exchanged on 10/23. Refused part of her HD that evening, stopping early. The patient arrested on 3300 on 06-26-23 and was sent to the ICU with hyperkalemia and  possible GI bleed.  Events Since Admission: Patient was on 3300 being treated for hyperglycemia coded the morning of 26-Jun-2023 and sent to ICU 2104.  Current  Status: Critical  Vital Signs: Temp:  [96.5 F (35.8 C)-98.1 F (36.7 C)] 97.7 F (36.5 C) (10/24 0800) Pulse Rate:  [97-121] 116  (10/24 0800) Resp:  [0-33] 22  (10/24 0945) BP: (89-174)/(41-80) 92/54 mmHg (10/24 0945) SpO2:  [93 %-100 %] 100 % (10/24 0800) FiO2 (%):  [100 %] 100 % (10/24 0918) Weight:  [90.8 kg (200 lb 2.8 oz)-91.3 kg (201 lb 4.5 oz)] 90.8 kg (200 lb 2.8 oz) (10/24 0500)  Physical Examination: General:  An ill looking 41 year old who looks older than stated age Neuro:  Follows some commands, spontaneous, sedated on ventilator HEENT:   ET tube in place mucous membranes are moist Neck:  Midline some swelling possible adipose tissue bilaterally Cardiovascular:  Peripheral pulses are weak. Sinus rhythm in the 90s seen on monitor. RRR normal S1-S2 Lungs:  Clear bilaterally with some diminished sounds bilateral bases Abdomen:  Obese soft nontender midline scar noted Musculoskeletal:  Moves all extremities Skin:  Lower extremities with areas of skin breakdown noted. Scarring noted in bilateral groins. Scarring and old incisions noted right lower extremity probable hemodialysis site.  Principal Problem:  *DKA (diabetic ketoacidosis) Active Problems:  Diabetic gastroparesis  ESRD on dialysis  DM (diabetes mellitus), type 2 with complications  Tobacco abuse  Hyperparathyroidism, secondary renal  Bipolar 1 disorder  Diabetic leg ulcer  Atherosclerosis of native arteries of the extremities with ulceration(440.23)  Left leg cellulitis  Hyponatremia   ASSESSMENT AND PLAN  PULMONARY No results found for this basename: PHART:5,PCO2:5,PCO2ART:5,PO2ART:5,HCO3:5,O2SAT:5 in the last 168 hours Ventilator Settings: Vent Mode:  [-] PRVC FiO2 (%):  [100 %] 100 % Set Rate:  [14 bmp] 14 bmp Vt Set:  [600 mL] 600  mL PEEP:  [5 cmH20] 5 cmH20 CXR: 10/24 post arrest 1. Dual lumen right IJ central line with no associated pneumothorax. 2. Small right pleural effusion suspected. Patchy nonspecific opacity at the left base.  ETT: 10/24 Endotracheal tube tip in good position.   A:  Respiratory arrest s/p cardiopulmonary arrest.  P:   Ventilate patient maintaining saturation between 92-96% Monitor the patient's acidosis F/u cxr  CARDIOVASCULAR  Lab 06/17/12 1633 06/17/12 1148  TROPONINI -- --  LATICACIDVEN -- 1.3  PROBNP 16777.0* --   ECG:  Sinus rhythm on the monitor Lines: Subclavian hemodialysis catheter, peripheral IV left upper arm, right IJ PIV.  A:  Cardiac arrest Shock: unclear etiology: hypovolemic/hemmorhagic vs cardiac ? Component of acidosis?  Congestive heart failure related to hypervolemia and a need for dialysis P:  Per nephrology the patient on CRRT Continuous monitoring Blood products as needed to maintain H&H Is a pressors as needed to maintain MAP. greater than 65   RENAL  Lab 06/19/12 0828 06/19/12 0420 06/19/12 06/18/12 2223 06/18/12 1524 06/18/12 0200  NA 136 136 133* 135 133* --  K 6.8* 6.4* -- -- -- --  CL 95* 98 96 98 94* --  CO2 12* 19 19 21 22  --  BUN 118* 112* 100* 95* 70* --  CREATININE 5.10* 4.93* 4.68* 4.74* 4.24* --  CALCIUM 9.6 8.3* 8.4 8.4 8.7 --  MG -- -- -- -- -- 3.8*  PHOS -- -- -- -- -- 9.1*   Intake/Output      10/23 0701 - 10/24 0700 10/24 0701 - 10/25 0700   P.O. 240    I.V. (mL/kg)  250 (2.8)   Other 10    Total Intake(mL/kg) 250 (2.8) 250 (2.8)   Urine (mL/kg/hr) 250 (0.1)    Other  1875    Total Output 2125    Net -1875 +250         Foley:  No Foley patient is anuric.  A:   Hyperkalemia (life threatening)  Hypervolemia Metabolic acidosis: + anion gap, likely d/t end-organ hypoperfusion P:   Dialysis as soon as possible Bicarbonate drip, until HD stared Send lactate and ketones.   GASTROINTESTINAL  Lab 06/18/12 0200   AST 40*  ALT 18  ALKPHOS 211*  BILITOT 0.1*  PROT 7.3  ALBUMIN 2.3*    A:  GI bleed. P:   Consult GI patient: Dr Elnoria Howard will see Protonix bolus and subsequent protonix drip Hemoccult stools  HEMATOLOGIC  Lab 06/19/12 0645 06/18/12 0200 06/17/12 2205 06/17/12 1842 06/17/12 1148  HGB 5.4* 9.5* 9.5* 9.9* 9.8*  HCT 17.8* 30.3* 30.7* 32.0* 32.0*  PLT 504* 450* 559* 604* 536*  INR -- -- -- -- --  APTT -- -- -- -- --   A:  Anemia related to GI bleeding P:  Transfuse PRBCs as needed Monitor frequent H&H  INFECTIOUS  Lab 06/19/12 0645 06/18/12 0200 06/17/12 2205 06/17/12 1842 06/17/12 1148  WBC 16.1* 24.1* 25.0* 24.9* 20.9*  PROCALCITON -- -- -- -- --   Cultures: C. difficile negative -10/23 MRSA negative by PCR 10/23  Antibiotics: Augmentin 10/23>>>10/24 Zosyn 10/24>>> Vancomycin during dialysis 10/24>>>  A:  Left lower extremity cellulitis P:   Continue antibiotics Consult pharmacy to dose vancomycin while patient is on CRRT   ENDOCRINE  Lab 06/19/12 0814 06/18/12 2144 06/18/12 1906 06/18/12 1135 06/18/12 0857  GLUCAP 381* 384* 329* 193* 154*   A:  Hyperglycemia  P:   Insulin gtt   NEUROLOGIC  A:  Acute Encephalopathy, possible anoxia  P:   Status post code Reevaluate patient's neuro status once the patient has recovered from hyperkalemia in the sedation has been reduced  BEST PRACTICE / DISPOSITION Level of Care:  Critical Primary Service:  Critical care services Consultants:  GI Code Status:  Full (per d/w NP Anders Simmonds who d/w family  - full code for now but if HD fails family open to discussions about palliation) Diet:  N.p.o. DVT Px:  None at this time possible GI bleed, no heparin. Lower extremities wounds no SCDs GI Px:  Protonix Skin Integrity:  Not intact multiple areas of injuries and sores Social / Family:  Patient was contacted postcode two family members were at bedside  The patient is critically ill with multiple organ systems  failure and requires high complexity decision making for assessment and support, frequent evaluation and titration of therapies, application of advanced monitoring technologies and extensive interpretation of multiple databases.   Critical Care Time devoted to patient care services described in this note is  60  Minutes.  Dr. Kalman Shan, M.D., [facility].C.P Pulmonary and Critical Care Medicine Staff Physician [facility] Pager: [phone], If no answer or between  15:00h - 7:00h: call [phone]  06/19/2012 1:04 PM

## 2012-06-19 NOTE — Progress Notes (Addendum)
ANTIBIOTIC CONSULT NOTE - FOLLOW UP  Pharmacy Consult:  Vancomycin/Zosyn Indication: cellulitis   Allergies  Allergen Reactions  . Compazine (Prochlorperazine) Swelling    Per echart records (takes promethazine at home)  . Contrast Media (Iodinated Diagnostic Agents) Swelling    Per echart records  . Meperidine Hcl Swelling  . Metformin And Related Other (See Comments)    'just not myself, was told not to take metformin'  . Morphine Swelling  . Nubain (Nalbuphine Hcl) Swelling  . Tobramycin Other (See Comments)    Unknown reaction  . Topiramate (Topamax) Swelling    Tongue swelling - per echart records  . Toradol (Ketorolac Tromethamine) Swelling    Patient Measurements: Height: 5\' 10"  (177.8 cm) Weight: 200 lb 2.8 oz (90.8 kg) IBW/kg (Calculated) : 68.5   Vital Signs: Temp: 97.7 F (36.5 C) (10/24 0806) Temp src: Axillary (10/24 0806) BP: 105/67 mmHg (10/24 0400) Pulse Rate: 108  (10/24 0400) Intake/Output from previous day: 10/23 0701 - 10/24 0700 In: 250 [P.O.:240] Out: 2125 [Urine:250] Intake/Output from this shift:    Labs:  Basename 06/19/12 0645 06/19/12 0420 06/19/12 06/18/12 2223 06/18/12 0200 06/17/12 2205  WBC 16.1* -- -- -- 24.1* 25.0*  HGB 5.4* -- -- -- 9.5* 9.5*  PLT 504* -- -- -- 450* 559*  LABCREA -- -- -- -- -- --  CREATININE -- 4.93* 4.68* 4.74* -- --   Estimated Creatinine Clearance: 18.2 ml/min (by C-G formula based on Cr of 4.93).  Basename 06/19/12 0420  VANCOTROUGH --  Leodis Binet --  VANCORANDOM 26.3  GENTTROUGH --  GENTPEAK --  GENTRANDOM --  TOBRATROUGH --  TOBRAPEAK --  TOBRARND --  AMIKACINPEAK --  AMIKACINTROU --  AMIKACIN --     Microbiology: Recent Results (from the past 720 hour(s))  CULTURE, BLOOD (ROUTINE X 2)     Status: Normal   Collection Time   06/05/12  4:50 PM      Component Value Range Status Comment   Specimen Description BLOOD HAND LEFT   Final    Special Requests BOTTLES DRAWN AEROBIC ONLY 5CC   Final     Culture  Setup Time 06/05/2012 21:04   Final    Culture NO GROWTH 5 DAYS   Final    Report Status 06/11/2012 FINAL   Final   CULTURE, BLOOD (ROUTINE X 2)     Status: Normal   Collection Time   06/05/12  4:51 PM      Component Value Range Status Comment   Specimen Description BLOOD HAND RIGHT   Final    Special Requests BOTTLES DRAWN AEROBIC ONLY 10CC   Final    Culture  Setup Time 06/05/2012 21:04   Final    Culture NO GROWTH 5 DAYS   Final    Report Status 06/11/2012 FINAL   Final   MRSA PCR SCREENING     Status: Abnormal   Collection Time   06/06/12  1:39 AM      Component Value Range Status Comment   MRSA by PCR POSITIVE (*) NEGATIVE Final   MRSA PCR SCREENING     Status: Normal   Collection Time   06/17/12  6:09 PM      Component Value Range Status Comment   MRSA by PCR NEGATIVE  NEGATIVE Final   CLOSTRIDIUM DIFFICILE BY PCR     Status: Normal   Collection Time   06/18/12  6:30 PM      Component Value Range Status Comment   C difficile by  pcr NEGATIVE  NEGATIVE Final    Assessment: 30 YOF admitted recently for lower extremity cellulitis and was placed on IV Vancomycin and Zosyn. The Zosyn was changed to Augmentin and the Vancomycin was to be continued through 10/22 per Dr. Hall Busing last discharge note. She returned 10/22 with noted skin breakdown on her buttocks and to continue on her current antibiotics.  She has ESRD and typically gets HD on TTS; her last HD session was on Saturday 06/14/12 PTA.  Patient to get HD 10/23 for only ~1.5 hrs (patient refused to continue) and perhaps daily HD until euvolemic.  Vancomycin level this AM is 26.3 mcg/mL. Next HD session scheduled today for 4 hrs.   Goal of Therapy:  Vanc pre-HD level 15-25 mcg/ml  Plan:  - Will resume Vancomycin 1 gram post-next HD session today - F/U HD schedule to order further vancomycin doses  Link Snuffer, PharmD, BCPS Clinical Pharmacist [phone] 06/19/2012, 8:17 AM  Ms. Wiedman is now in MICU s/p  cardio-pulmonary arrest this AM. Noted she is now on Levophed and bicarb drip. Noted WBC increased to 40.9 and hypothermia reported.  Vancomycin continues empirically, noted potential plans to start Zosyn. Also noted patient was reportedly having bloody BMs, and continues on SQ heparin. Pt currently has thrombocytosis.  Cultures:  C. difficile negative -10/23  MRSA negative by PCR 10/23   Antibiotics:  Augmentin 10/23>>>10/24  Zosyn 10/24>>>  Vancomycin during dialysis 10/24>>>  Plan: - Change Vancomycin to 1 gm IV q 24h while on CVVHD - Zosyn 3.375gm IV q 6h. - Will monitor cx/spec/sens, RRT plans and clinical status daily. - Will ask MD about switching from SQ heparin to SCDs with suspected GIB.  Thanks, Cletus Mehlhoff K. Allena Katz, PharmD, BCPS.  Clinical Pharmacist Pager [phone]. 06/19/2012 1:12 PM

## 2012-06-19 NOTE — Progress Notes (Signed)
CRITICAL VALUE ALERT  Critical value received:  6.8  Date of notification:  06/19/12  Time of notification:  09:52AM  Critical value read back:yes  Nurse who received alert:  Ulis Rias RN  MD notified (1st page):  Anders Simmonds NP  Time of first page:  09:57AM  MD notified (2nd page):  Time of second page:  Responding MD:  Anders Simmonds NP  Time MD responded:  09:57AM

## 2012-06-19 NOTE — Progress Notes (Signed)
OT Cancellation Note  Patient Details Name: [name] MRN: [id number] DOB: 1971/05/13   Cancelled Treatment:   Pt with hgb: 5.6. Also, pt became unresponsive and was transferred to 2100. Will hold at this time and check back tomorrow. Thank you. Glendale Chard, OTR/L Pager: [phone] 06/19/2012   [name] 06/19/2012, 12:02 PM

## 2012-06-19 NOTE — Progress Notes (Signed)
FPTS Interim Event Note  Pt seen at bedside this morning around 0830, responsive but very uncomfortable/dyspneic. Labs reviewed, K 6.4, Hb 5.4, report of blood BM's overnight. When I saw pt, she had loud diffuse crackles in most of her lung fields and was breathing 22-30 times per minute with sats varying between 85-low 90's. I contacted Dr. Tye Savoy, who assisted with decision-making. Dr. Caryn Section (nephrology) was contacted, already aware of pt's K and pt was on schedule for dialysis this morning; I informed him of pt's Hb and was in the process of ordering 2 units of pRBC to be transfused while on dialysis.  CCM was contacted as well due to pt's continued poor clinical appearance, and it was recommended for stat blood transfusion while waiting for HD, as well as a stat CXR. While I was entering these orders, pt became unresponsive with loss of pulse and sudden drop in heart rate. Code blue was subsequently called. Please see the paper code documentation and Dr. Gwenlyn Fudge code response note. Code blue was managed in conjunction with IM and CCM. Pt was intubated, pulse recovered (sinus tach) with pt remaining hemodynamically unstable, and pt was transported to ICU under CCM's care.  I contacted the pt's sister Lupita Leash Barns and gave her a brief update of the morning's events. She and pt's mother are on their way to the hospital. I was also contacted by pt's other sister who is currently out of state and updated her, as well. Both sisters were aware of pt's "password" for disclosure of information.  FPTS will continue to follow along with pt while she is in intensive care, to be better able to take back over her care if/when she is stable. FPTS greatly appreciates IM, nephrology and CCM's assistance.  Bobbye Morton, MD PGY-1, [facility] FPTS Intern pager: [phone]

## 2012-06-19 NOTE — Progress Notes (Signed)
Late Entry  CRITICAL VALUE ALERT  Critical value received:  hgb 5.4  Date of notification:  06/19/2012  Time of notification:  08:16  Critical value read back:yes  Nurse who received alert:  Jane Canary, RN  MD notified (1st page):  Dr. Casper Harrison  Time of first page:  08:16 (MD in room)  MD notified (2nd page):  Time of second page:  Responding MD:  Dr. Casper Harrison     Time MD responded:  08:16

## 2012-06-19 NOTE — Code Documentation (Signed)
CODE BLUE NOTE  Patient Name: [name]   MRN: [id number]   Date of Birth/ Sex: Oct 15, 1970 , female      Admission Date: 06/17/2012  Attending Provider: Tobin Chad, MD  Primary Diagnosis: DKA (diabetic ketoacidosis)    Indication: Pt was in her usual state of health until this 8 AM, when she was noted to be unresponsive . Code blue was subsequently called. At the time of arrival on scene, ACLS protocol was underway.    Technical Description:  - CPR performance duration:  At least 20 minutes  - Was defibrillation or cardioversion used? no  - Was external pacer placed? No   - Was patient intubated pre/post CPR? Yes     Medications Administered: Y = Yes; Blank = No Amiodarone    Atropine    Calcium  x1  Epinephrine  x2  Lidocaine    Magnesium    Norepinephrine    Phenylephrine    Sodium bicarbonate  x2  Vasopressin      Post CPR evaluation:  - Final Status - Was patient successfully resuscitated ? Yes - What is current rhythm? Sinus tachycardia  - What is current hemodynamic status? Instable with MAP of 35   Miscellaneous Information:     - Primary team notified?  Yes  - Family Notified? Yes   - Additional notes/ transfer status:  PCCM called and at bedside . Transfer to 2100   Almyra Deforest, MD  06/19/2012, 8:56 AM

## 2012-06-19 NOTE — Care Management Note (Signed)
    Page 1 of 1   06/24/2012     1:35:58 PM   CARE MANAGEMENT NOTE 06/24/2012  Patient:  [name], [name]   Account Number:  [id number]  Date Initiated:  06/19/2012  Documentation initiated by:  [facility]  Subjective/Objective Assessment:   ESRD - post code placed in ICU.     Action/Plan:   Anticipated DC Date:  06/26/2012   Anticipated DC Plan:  HOME W HOME HEALTH SERVICES         Choice offered to / List presented to:             Status of service:  In process, will continue to follow Medicare Important Message given?   (If response is "NO", the following Medicare IM given date fields will be blank) Date Medicare IM given:   Date Additional Medicare IM given:    Discharge Disposition:    Per UR Regulation:  Reviewed for med. necessity/level of care/duration of stay  If discussed at Long Length of Stay Meetings, dates discussed:   06/24/2012    Comments:  06-24-12 1:30pm Avie Arenas, RNBSN [phone] Palliative meeting yesterday - llimited code, does not want CPR or reintubation.  Wants HD as OP per palliative note, wants to go home.  Prior to admission is a patient with [facility] for PT and RN.  Talked with sister Lupita Leash - who lives with patient and mom  most of the time.  Discussed discharge options with hospice or HH - [facility] [place].  Feel would propably go with [facility] if it came the time to take her home.  Patient would rather stay in hopsital and not go home.  Discussed that depending on how the patient progressed this may not be an option and they would need to take patient home or to hospice home or SNF.  Flatly refused hospice home or SNF at this time, but also state Mom could not handle if goes home.  CM will continue to follow progression for discharge planning.  06-23-12 9:30am Avie Arenas, RNBSN [phone] Continues on CRRT. off vent. Ltach candidate when on HD.  06-19-12 10:50am Avie Arenas, RNBSN [phone] Sister and mother  in to see patient - updated.  CM will continue to follow.  Per previoius note, lives with mother and sister, active with [facility] and with [facility].

## 2012-06-19 NOTE — Progress Notes (Signed)
[facility]  Subjective:  [name] had CODE this AM.  She got 2 1/2 hr out of scheduled 4 1/2 hr HD yesterday.  K was 6.4 this AM.  She was scheduled to get HD plus 2 U PRBC (rectal bleeding) for hgb 5.4 (9.5 on prior CBC).  She's hypotensive ans  transferred to MICU   Objective: Vital signs in last 24 hours: Blood pressure 105/65, pulse 116, temperature 97.7 F (36.5 C), temperature source Axillary, resp. rate 25, height 6\' 1"  (1.854 m), weight 90.8 kg (200 lb 2.8 oz), last menstrual period 09/03/2008, SpO2 100.00%.    PHYSICAL EXAM General--As above, poorly responsive,edematous face  Chest--HD cath R IJ, rhonchi  Heart--no rub  Abd--nontender, sacral decub  Extr--marked edema    Lab Results:   Lab 06/19/12 0828 06/19/12 0420 06/19/12 06/18/12 0200  NA 136 136 133* --  K 6.8* 6.4* 5.4* --  CL 95* 98 96 --  CO2 12* 19 19 --  BUN 118* 112* 100* --  CREATININE 5.10* 4.93* 4.68* --  ALB -- -- -- --  GLUCOSE 440* -- -- --  CALCIUM 9.6 8.3* 8.4 --  PHOS -- -- -- 9.1*     Basename 06/19/12 0645 06/18/12 0200  WBC 16.1* 24.1*  HGB 5.4* 9.5*  HCT 17.8* 30.3*  PLT 504* 450*   I have reviewed the patient's current medications.  Assessment/Plan: 1. ESRD-now with dangerously high K.  BP too low to get HD--on NE.  Will use CVVH for renal replacement therapy 2. Anemia--on aranesp and venofer 100/wk.  To get 2 U PRBC  today--likely will need more. Need GI to see. Will try CVVH with no anticoag.  No other IV ports to give citrate through 3. DM--glu 440 this AM--SSI 4. PVD--no new suggestions  5. CAD-- " " "  6. Diarrhea--c. diff pending  7. Sacral decub--RN requests Santyl ointment and wound care consult (I ordered)  8. LE cellulitis--on vanco  9. Secondary PTH--phosphorus too high to allow hectorol. Will give P binders--on Sevelamer 10. Rectal bleed--need GI to see, check coags     LOS: 2 days   [name] F 06/19/2012,10:07 AM   .labalb

## 2012-06-19 NOTE — Addendum Note (Signed)
Addendum  created 06/19/12 0925 by Elon Alas, CRNA   Modules edited:Charges VN

## 2012-06-19 NOTE — Progress Notes (Signed)
I have collaborated with this patient and note below. 06/19/2012  [place] Bing, PT [phone] (pager)

## 2012-06-19 NOTE — Progress Notes (Signed)
Pt transferred to 2104 from 3300 post code blue/cardiac arrest and CPR X 10 minutes.  Pt awake and follows verbal commands.

## 2012-06-19 NOTE — Progress Notes (Signed)
Late Entry: While in patient's room at 08:38 code called. Patient's HR dropped into the 20's and patient became unresponsive.  Code called (please see code sheet in patient's shadow chart).  Prior to code patient was becoming increasingly lethargic with c/o SOB; patient sounded very rhonchus on assessment.  Patient transferred to 2104 with belongings; MD notified family.

## 2012-06-19 NOTE — Procedures (Signed)
Arterial Catheter Insertion Procedure Note [name] [id number] 20-Mar-1971  Procedure: Insertion of Arterial Catheter  Indications: Blood pressure monitoring and Frequent blood sampling  Procedure Details Consent: Risks of procedure as well as the alternatives and risks of each were explained to the (patient/caregiver).  Consent for procedure obtained. Time Out: Verified patient identification, verified procedure, site/side was marked, verified correct patient position, special equipment/implants available, medications/allergies/relevent history reviewed, required imaging and test results available.  Performed Real time [place] used to ID and cannulate the left femoral artery.  Maximum sterile technique was used including antiseptics, cap, gloves, gown, hand hygiene, mask and sheet. Skin prep: Chlorhexidine; local anesthetic administered 22 gauge catheter was inserted into left femoral artery using the Seldinger technique.  Evaluation Blood flow good; BP tracing good. Complications: No apparent complications.   [name],PETE 06/19/2012

## 2012-06-19 NOTE — Anesthesia Procedure Notes (Signed)
Procedures

## 2012-06-19 NOTE — Progress Notes (Addendum)
06/18/2012 2220  Pt CBG 384, is having frequent loose, reddish-brown stools, and c/o back pain 8/10 she would like to get her percocet rescheduled. Dr. Paulina Fusi notified. New orders received for one time dose 5 units of novolog sub-q, hemoccult stool sample and to insert flexiseal.  0200 Pt stools are now frequent, large, loose, and reddish-black with no clots so flexiseal not inserted. Hemoccult is positive. HR 116 BP 138/63 Dr. Paulina Fusi notified, no new orders received. Will continue to monitor.  0615 Pt continues to have large, loose, reddish-black stools with no clots. HR 108 BP now 105/62 Dr. Paulina Fusi notified. New orders received for STAT CBC. Will continue to monitor.  [place] Haly Feher, RN

## 2012-06-19 NOTE — Procedures (Signed)
Central Venous Catheter Insertion Procedure Note ZORIAH PULICE [id number] [birth year]  Procedure: Attempted Insertion of Central Venous Catheter Indications: Assessment of intravascular volume, Drug and/or fluid administration and Frequent blood sampling  Procedure Details Consent: Unable to obtain consent because of emergent medical necessity. Time Out: Verified patient identification, verified procedure, site/side was marked, verified correct patient position, special equipment/implants available, medications/allergies/relevent history reviewed, required imaging and test results available.  Performed  Maximum sterile technique was used including antiseptics, cap, gloves, gown, hand hygiene, mask and sheet. Skin prep: Chlorhexidine; local anesthetic administered A antimicrobial bonded/coated single lumen catheter was placed in the right internal jugular vein using the Seldinger technique.  Evaluation Blood flow good Complications: Complications of unable to pass the wire deep enough to place the triple lumen cath.  R IJ 8cm dbl lumen placed.  Patient did tolerate procedure well. Chest X-ray ordered to verify placement.  CXR: pending.  Bynum Bellows 06/19/2012, 10:12 AM

## 2012-06-19 NOTE — Progress Notes (Signed)
CRITICAL VALUE ALERT  Critical value received:  K+ 6.4  Date of notification:  06/19/2012  Time of notification:  6:43 AM  Critical value read back:yes  Nurse who received alert:  [place] Kymiah Araiza, RN  MD notified (1st page):  Dr. Paulina Fusi  Time of first page:  [phone]  MD notified (2nd page):  Time of second page:  Responding MD:    Time MD responded:  [phone]

## 2012-06-20 ENCOUNTER — Inpatient Hospital Stay (HOSPITAL_COMMUNITY): Payer: Medicare Other

## 2012-06-20 ENCOUNTER — Encounter (HOSPITAL_COMMUNITY)
Admission: EM | Disposition: A | Discharge status: Home Health | Payer: Self-pay | Source: Home / Self Care | Attending: Family Medicine

## 2012-06-20 DIAGNOSIS — L0291 Cutaneous abscess, unspecified: Secondary | ICD-10-CM

## 2012-06-20 DIAGNOSIS — R579 Shock, unspecified: Secondary | ICD-10-CM

## 2012-06-20 DIAGNOSIS — N186 End stage renal disease: Secondary | ICD-10-CM

## 2012-06-20 DIAGNOSIS — I469 Cardiac arrest, cause unspecified: Secondary | ICD-10-CM

## 2012-06-20 DIAGNOSIS — E118 Type 2 diabetes mellitus with unspecified complications: Secondary | ICD-10-CM

## 2012-06-20 DIAGNOSIS — E875 Hyperkalemia: Secondary | ICD-10-CM

## 2012-06-20 DIAGNOSIS — J9600 Acute respiratory failure, unspecified whether with hypoxia or hypercapnia: Secondary | ICD-10-CM

## 2012-06-20 DIAGNOSIS — E1110 Type 2 diabetes mellitus with ketoacidosis without coma: Secondary | ICD-10-CM

## 2012-06-20 LAB — POCT I-STAT EG7
Acid-Base Excess: 3 mmol/L — ABNORMAL HIGH (ref 0.0–2.0)
Acid-Base Excess: 1 mmol/L (ref 0.0–2.0)
Acid-base deficit: 2 mmol/L (ref 0.0–2.0)
Acid-base deficit: 3 mmol/L — ABNORMAL HIGH (ref 0.0–2.0)
Bicarbonate: 24.2 mEq/L — ABNORMAL HIGH (ref 20.0–24.0)
Bicarbonate: 27 mEq/L — ABNORMAL HIGH (ref 20.0–24.0)
Calcium, Ion: 0.34 mmol/L — CL (ref 1.12–1.23)
Calcium, Ion: 0.58 mmol/L — CL (ref 1.12–1.23)
Calcium, Ion: 0.58 mmol/L — CL (ref 1.12–1.23)
Calcium, Ion: 0.96 mmol/L — ABNORMAL LOW (ref 1.12–1.23)
HCT: 29 % — ABNORMAL LOW (ref 36.0–46.0)
HCT: 29 % — ABNORMAL LOW (ref 36.0–46.0)
HCT: 28 % — ABNORMAL LOW (ref 36.0–46.0)
HCT: 25 % — ABNORMAL LOW (ref 36.0–46.0)
Hemoglobin: 9.9 g/dL — ABNORMAL LOW (ref 12.0–15.0)
Hemoglobin: 9.9 g/dL — ABNORMAL LOW (ref 12.0–15.0)
Hemoglobin: 8.5 g/dL — ABNORMAL LOW (ref 12.0–15.0)
Hemoglobin: 9.2 g/dL — ABNORMAL LOW (ref 12.0–15.0)
O2 Saturation: 58 %
Patient temperature: 98.1
Patient temperature: 96.8
Patient temperature: 98.6
Patient temperature: 98.6
Potassium: 2.3 mEq/L — CL (ref 3.5–5.1)
Potassium: 2.6 mEq/L — CL (ref 3.5–5.1)
Potassium: 3 mEq/L — ABNORMAL LOW (ref 3.5–5.1)
Potassium: 3.5 mEq/L (ref 3.5–5.1)
Potassium: 2.8 mEq/L — ABNORMAL LOW (ref 3.5–5.1)
Sodium: 140 mEq/L (ref 135–145)
Sodium: 140 mEq/L (ref 135–145)
Sodium: 139 mEq/L (ref 135–145)
TCO2: 26 mmol/L (ref 0–100)
TCO2: 33 mmol/L (ref 0–100)
TCO2: 30 mmol/L (ref 0–100)
pCO2, Ven: 46.9 mmHg (ref 45.0–50.0)
pCO2, Ven: 42.4 mmHg — ABNORMAL LOW (ref 45.0–50.0)
pCO2, Ven: 41.8 mmHg — ABNORMAL LOW (ref 45.0–50.0)
pH, Ven: 7.298 (ref 7.250–7.300)
pH, Ven: 7.348 — ABNORMAL HIGH (ref 7.250–7.300)
pH, Ven: 7.487 — ABNORMAL HIGH (ref 7.250–7.300)
pH, Ven: 7.44 — ABNORMAL HIGH (ref 7.250–7.300)
pO2, Ven: 24 mmHg — CL (ref 30.0–45.0)
pO2, Ven: 24 mmHg — CL (ref 30.0–45.0)
pO2, Ven: 30 mmHg (ref 30.0–45.0)

## 2012-06-20 LAB — RENAL FUNCTION PANEL
Albumin: 1.9 g/dL — ABNORMAL LOW (ref 3.5–5.2)
Albumin: 1.7 g/dL — ABNORMAL LOW (ref 3.5–5.2)
Albumin: 1.7 g/dL — ABNORMAL LOW (ref 3.5–5.2)
BUN: 94 mg/dL — ABNORMAL HIGH (ref 6–23)
Calcium: 9.7 mg/dL (ref 8.4–10.5)
Calcium: 9.2 mg/dL (ref 8.4–10.5)
Chloride: 96 mEq/L (ref 96–112)
Chloride: 93 mEq/L — ABNORMAL LOW (ref 96–112)
Creatinine, Ser: 4.28 mg/dL — ABNORMAL HIGH (ref 0.50–1.10)
Creatinine, Ser: 3.81 mg/dL — ABNORMAL HIGH (ref 0.50–1.10)
Creatinine, Ser: 3.72 mg/dL — ABNORMAL HIGH (ref 0.50–1.10)
GFR calc non Af Amer: 12 mL/min — ABNORMAL LOW (ref >90)
GFR calc non Af Amer: 14 mL/min — ABNORMAL LOW (ref >90)
GFR calc non Af Amer: 14 mL/min — ABNORMAL LOW (ref >90)
Phosphorus: 6 mg/dL — ABNORMAL HIGH (ref 2.3–4.6)
Phosphorus: 6.6 mg/dL — ABNORMAL HIGH (ref 2.3–4.6)
Phosphorus: 7 mg/dL — ABNORMAL HIGH (ref 2.3–4.6)

## 2012-06-20 LAB — POCT I-STAT 7, (LYTES, BLD GAS, ICA,H+H)
Acid-Base Excess: 2 mmol/L (ref 0.0–2.0)
Acid-Base Excess: 3 mmol/L — ABNORMAL HIGH (ref 0.0–2.0)
Acid-Base Excess: 5 mmol/L — ABNORMAL HIGH (ref 0.0–2.0)
Acid-Base Excess: 9 mmol/L — ABNORMAL HIGH (ref 0.0–2.0)
Acid-Base Excess: 9 mmol/L — ABNORMAL HIGH (ref 0.0–2.0)
Bicarbonate: 25.3 mEq/L — ABNORMAL HIGH (ref 20.0–24.0)
Bicarbonate: 26.4 mEq/L — ABNORMAL HIGH (ref 20.0–24.0)
Bicarbonate: 32.5 mEq/L — ABNORMAL HIGH (ref 20.0–24.0)
Bicarbonate: 31.8 mEq/L — ABNORMAL HIGH (ref 20.0–24.0)
Calcium, Ion: 1.13 mmol/L (ref 1.12–1.23)
Calcium, Ion: 1.11 mmol/L — ABNORMAL LOW (ref 1.12–1.23)
HCT: 24 % — ABNORMAL LOW (ref 36.0–46.0)
HCT: 22 % — ABNORMAL LOW (ref 36.0–46.0)
Hemoglobin: 8.2 g/dL — ABNORMAL LOW (ref 12.0–15.0)
Hemoglobin: 8.2 g/dL — ABNORMAL LOW (ref 12.0–15.0)
Hemoglobin: 8.2 g/dL — ABNORMAL LOW (ref 12.0–15.0)
Hemoglobin: 7.5 g/dL — ABNORMAL LOW (ref 12.0–15.0)
O2 Saturation: 99 %
O2 Saturation: 97 %
O2 Saturation: 97 %
Patient temperature: 97.4
Potassium: 3.9 mEq/L (ref 3.5–5.1)
Potassium: 3.6 mEq/L (ref 3.5–5.1)
Sodium: 139 mEq/L (ref 135–145)
Sodium: 139 mEq/L (ref 135–145)
Sodium: 140 mEq/L (ref 135–145)
Sodium: 139 mEq/L (ref 135–145)
Sodium: 137 mEq/L (ref 135–145)
TCO2: 22 mmol/L (ref 0–100)
TCO2: 26 mmol/L (ref 0–100)
TCO2: 27 mmol/L (ref 0–100)
TCO2: 30 mmol/L (ref 0–100)
TCO2: 34 mmol/L (ref 0–100)
TCO2: 33 mmol/L (ref 0–100)
pCO2 arterial: 30.9 mmHg — ABNORMAL LOW (ref 35.0–45.0)
pH, Arterial: 7.435 (ref 7.350–7.450)
pH, Arterial: 7.502 — ABNORMAL HIGH (ref 7.350–7.450)
pH, Arterial: 7.577 — ABNORMAL HIGH (ref 7.350–7.450)
pH, Arterial: 7.535 — ABNORMAL HIGH (ref 7.350–7.450)
pO2, Arterial: 149 mmHg — ABNORMAL HIGH (ref 80.0–100.0)
pO2, Arterial: 75 mmHg — ABNORMAL LOW (ref 80.0–100.0)
pO2, Arterial: 80 mmHg (ref 80.0–100.0)

## 2012-06-20 LAB — GLUCOSE, CAPILLARY
Glucose-Capillary: 102 mg/dL — ABNORMAL HIGH (ref 70–99)
Glucose-Capillary: 131 mg/dL — ABNORMAL HIGH (ref 70–99)
Glucose-Capillary: 181 mg/dL — ABNORMAL HIGH (ref 70–99)
Glucose-Capillary: 191 mg/dL — ABNORMAL HIGH (ref 70–99)
Glucose-Capillary: 240 mg/dL — ABNORMAL HIGH (ref 70–99)
Glucose-Capillary: 272 mg/dL — ABNORMAL HIGH (ref 70–99)
Glucose-Capillary: 341 mg/dL — ABNORMAL HIGH (ref 70–99)
Glucose-Capillary: 400 mg/dL — ABNORMAL HIGH (ref 70–99)
Glucose-Capillary: 118 mg/dL — ABNORMAL HIGH (ref 70–99)
Glucose-Capillary: 116 mg/dL — ABNORMAL HIGH (ref 70–99)
Glucose-Capillary: 119 mg/dL — ABNORMAL HIGH (ref 70–99)
Glucose-Capillary: 152 mg/dL — ABNORMAL HIGH (ref 70–99)
Glucose-Capillary: 153 mg/dL — ABNORMAL HIGH (ref 70–99)
Glucose-Capillary: 133 mg/dL — ABNORMAL HIGH (ref 70–99)

## 2012-06-20 LAB — CBC
MCH: 29.5 pg (ref 26.0–34.0)
MCH: 30.4 pg (ref 26.0–34.0)
MCHC: 34.2 g/dL (ref 30.0–36.0)
MCHC: 34.5 g/dL (ref 30.0–36.0)
MCHC: 34.6 g/dL (ref 30.0–36.0)
MCV: 88.3 fL (ref 78.0–100.0)
Platelets: 397 10*3/uL (ref 150–400)
Platelets: 288 10*3/uL (ref 150–400)
Platelets: 265 10*3/uL (ref 150–400)
RBC: 2.64 MIL/uL — ABNORMAL LOW (ref 3.87–5.11)
RDW: 18.6 % — ABNORMAL HIGH (ref 11.5–15.5)
RDW: 19.4 % — ABNORMAL HIGH (ref 11.5–15.5)
WBC: 46.7 10*3/uL — ABNORMAL HIGH (ref 4.0–10.5)

## 2012-06-20 LAB — MAGNESIUM: Magnesium: 2.6 mg/dL — ABNORMAL HIGH (ref 1.5–2.5)

## 2012-06-20 LAB — POCT ACTIVATED CLOTTING TIME: Activated Clotting Time: 154 seconds

## 2012-06-20 SURGERY — Surgical Case
Anesthesia: *Unknown

## 2012-06-20 SURGERY — EGD (ESOPHAGOGASTRODUODENOSCOPY)
Anesthesia: Moderate Sedation

## 2012-06-20 MED ORDER — SODIUM CHLORIDE 0.9 % IV SOLN
INTRAVENOUS | Status: DC
  Administered 2012-06-20 – 2012-06-21 (×2): via INTRAVENOUS

## 2012-06-20 MED ORDER — FENTANYL BOLUS VIA INFUSION
25.0000 ug | Freq: Four times a day (QID) | INTRAVENOUS | Status: DC | PRN
  Filled 2012-06-20: qty 100

## 2012-06-20 MED ORDER — FENTANYL CITRATE 0.05 MG/ML IJ SOLN
INTRAMUSCULAR | Status: AC
  Administered 2012-06-20: 100 ug via INTRAVENOUS
  Filled 2012-06-20: qty 2

## 2012-06-20 MED ORDER — INSULIN GLARGINE 100 UNIT/ML ~~LOC~~ SOLN
10.0000 [IU] | SUBCUTANEOUS | Status: DC
  Administered 2012-06-20: 10 [IU] via SUBCUTANEOUS

## 2012-06-20 MED ORDER — PRISMASOL B22GK 4/0 22-4 MEQ/L IV SOLN
INTRAVENOUS | Status: DC
  Administered 2012-06-20 – 2012-06-21 (×2): via INTRAVENOUS_CENTRAL
  Filled 2012-06-20 (×3): qty 5000

## 2012-06-20 MED ORDER — ALTEPLASE 100 MG IV SOLR
4.0000 mg | Freq: Once | INTRAVENOUS | Status: DC
  Filled 2012-06-20: qty 4

## 2012-06-20 MED ORDER — PROPOFOL 10 MG/ML IV EMUL: 5.0000 ug/kg/min | INTRAVENOUS | Status: DC

## 2012-06-20 MED ORDER — PRISMASOL B22GK 4/0 22-4 MEQ/L IV SOLN
INTRAVENOUS | Status: DC
  Administered 2012-06-20 – 2012-06-21 (×3): via INTRAVENOUS_CENTRAL
  Filled 2012-06-20 (×6): qty 5000

## 2012-06-20 MED ORDER — INSULIN ASPART 100 UNIT/ML ~~LOC~~ SOLN
2.0000 [IU] | SUBCUTANEOUS | Status: DC
Start: 2012-06-20 — End: 2012-06-21
  Administered 2012-06-20 – 2012-06-21 (×2): 6 [IU] via SUBCUTANEOUS

## 2012-06-20 MED ORDER — SODIUM CHLORIDE 0.9 % IV SOLN
25.0000 ug/h | INTRAVENOUS | Status: DC
  Filled 2012-06-20: qty 50

## 2012-06-20 MED FILL — Medication: Qty: 1 | Status: AC

## 2012-06-20 NOTE — Progress Notes (Signed)
Notified Dr. Craige Cotta that Nursing staff has been having difficulty with CRRT therapy.  This RN and Manon Hilding have worked trouble shooting to deliver therapy and have been unsuccessful which is consistent with previous shift RN [employer] report.  Filter has been changed numerous times and now we will attempt to change the CRRT machine

## 2012-06-20 NOTE — Procedures (Signed)
Exchange R IJ tunnelled HD cath No complication No blood loss. See complete dictation in [facility].

## 2012-06-20 NOTE — Progress Notes (Signed)
PT/OT Cancellation Note  Patient Details Name: [name] MRN: [id number] DOB: 08-31-1970   Cancelled Treatment:    Reason Eval/Treat Not Completed: Patient not medically ready.  Pt now on CVVHD and ETT.  Please advise when appropriate for therapies.  Thanks.     Sunny Schlein, [place] [phone] 06/20/2012, 1:01 PM

## 2012-06-20 NOTE — Progress Notes (Signed)
 [facility]  Subjective:  Opens eyes.  Intubated, sedated, on 12 mcg/min levophed BP by A-line 120/54 CVVH continues 0/2.5 fluids + IV bicarb.. UF at - 80 cc/hr   Objective: Vital signs in last 24 hours: Blood pressure 102/42, pulse 93, temperature 96.8 F (36 C), temperature source Oral, resp. rate 17, height 6\' 1"  (1.854 m), weight 90.6 kg (199 lb 11.8 oz), last menstrual period 09/03/2008, SpO2 96.00%.    PHYSICAL EXAM General--as above  Chest--rhonchi  Heart--no rub  Abd--nontender Extr--1+ edema  Lab Results:   Lab 06/20/12 0541 06/20/12 0535 06/20/12 0345 06/19/12 2215 06/19/12 1600  NA 139 139 [id number] -- --  K 3.6 <2.0* 3.93.9 -- --  CL -- -- 96 98 93*  CO2 -- -- 23 19 17*  BUN -- -- 108* 123* 132*  CREATININE -- -- 4.28* 4.80* 5.13*  ALB -- -- -- -- --  GLUCOSE -- -- 133* -- --  CALCIUM -- -- 9.2 8.8 8.4  PHOS -- -- 6.0* 7.6* 9.9*     Basename 06/20/12 0541 06/20/12 0535 06/20/12 0345 06/19/12 2215  WBC -- -- 51.9* 44.1*  HGB 7.5* 10.9* -- --  HCT 22.0* 32.0* -- --  PLT -- -- 397 385     I have reviewed the patient's current medications. Scheduled: Continuous:  Assessment/Plan: 1. ESRD-now on CVVH for renal replacement therapy.  K no longer high  On citrate anticoag.  Change fluids to     4K/0 Ca.  Try - 120 cc/hr UF 2.  Anemia--on aranesp and venofer 100/wk. Got 2 U PRBC yesterday-hgb 7.5 this AM Need GI to see.  3 . DM--glu 133 this AM--SSI  4.  PVD--no new suggestions  5.  CAD-- " " "  6.  Diarrhea--c. diff pending  7.  Sacral decub-- Santyl ointment and wound care consult8. LE cellulitis--on vanco  9.  Secondary PTH--phosphorus too high to allow hectorol. Will give P binders--on Sevelamer  10. Rectal bleed--need GI to see--PT 22.4 sec/INR 2.06 yesterday--not on coumadin as outpatient--recheck today 11. VDRF--CXR shows vascular congestion, she has LOTS of excess fluid (EDW 80 kg and she weighs today 90.6            kg).  Will  try to UF - 120cc/hr if BP allows.  Check CVP     LOS: 3 days   Zahara Rembert F 06/20/2012,8:29 AM   .labalb

## 2012-06-20 NOTE — Progress Notes (Signed)
OT Cancellation Note  Patient Details Name: [name] MRN: [id number] DOB: 09/15/1970   Cancelled Treatment:    Reason Eval/Treat Not Completed: Medical issues which prohibited therapy - Pt now on CVVHD and ETT. Please advise when appropriate for therapies. Thanks.    Jeani Hawking M [phone] 06/20/2012, 3:08 PM

## 2012-06-20 NOTE — Progress Notes (Signed)
Name: [name] MRN: [id number] DOB: 12-09-1970    LOS: 3 PCP is Ernestine Conrad, MD Referring: [facility]PTS PULMONARY / CRITICAL CARE MEDICINE  HPI:   41 yo female admitted on 06/17/2012 for HD graft revision.  She was noted to have elevated glucose and anion gap.  She had difficulty with HD 10/23, and then developed cardiac arrest 2nd to hypovolemic shock from GI bleed and hyperkalemia 22-Jun-2023.  Transferred to ICU 22-Jun-2023, and PCCM assumed care.  She had recent tx for leg cellulitis.  Signficant PMHx ESRD on HD, CAD, DM, DVT, gastroparesis, PVD, Bipolar, HTN   Events Since Admission: Patient was on 3300 being treated for hyperglycemia coded the morning of 2023/06/22 and sent to ICU 2104. 10/25 Was placed on CRRT last night and continues on  The Vent, Levophed drip and Insulin drip.  Current Status: Critical  Vital Signs: Temp:  [96.8 F (36 C)-98.6 F (37 C)] 98.6 F (37 C) (10/25 0800) Pulse Rate:  [68-127] 102  (10/25 1300) Resp:  [0-26] 13  (10/25 1300) BP: (67-123)/(30-85) 83/45 mmHg (10/25 1300) SpO2:  [75 %-100 %] 100 % (10/25 1300) Arterial Line BP: (83-137)/(44-74) 100/60 mmHg (10/25 1315) FiO2 (%):  [40 %-50 %] 40 % (10/25 1136) Weight:  [199 lb 11.8 oz (90.6 kg)] 199 lb 11.8 oz (90.6 kg) (10/25 0439)  Physical Examination: General:  An ill looking 41 year old who looks older than stated age Neuro:  Follows commands, opens eyes on command, nods appropriately to questions. HEENT:   ET tube in place mucous membranes are moist Neck:  Midline some swelling possible adipose tissue bilaterally Cardiovascular:  Peripheral pulses are weak. Sinus rhythm in the 90s seen on monitor. RRR normal S1-S2 Lungs:  Clear bilaterally with some diminished sounds bilateral bases Abdomen:  Obese soft nontender midline scar noted Musculoskeletal:  Moves all extremities Skin:  Lower extremities with areas of skin breakdown noted. Scarring noted in bilateral groins. Scarring and old incisions noted right  lower extremity probable hemodialysis site.  Principal Problem:  *DKA (diabetic ketoacidosis) Active Problems:  Diabetic gastroparesis  ESRD on dialysis  DM (diabetes mellitus), type 2 with complications  Tobacco abuse  Hyperparathyroidism, secondary renal  Bipolar 1 disorder  Diabetic leg ulcer  Atherosclerosis of native arteries of the extremities with ulceration(440.23)  Left leg cellulitis  Hyponatremia  Acute respiratory failure with hypoxia  Shock circulatory  Cardiac arrest  Acidosis  Hyperkalemia  Portable Chest Xray In Am  06/20/2012  *RADIOLOGY REPORT*  Clinical Data: Endotracheal tube position.  PORTABLE CHEST - 1 VIEW  Comparison: One-view chest 21-Jun-2012.  Findings: The endotracheal tube terminates 3.6 cm above the carina. An NG tube is in place.  The right IJ dialysis catheter is stable. The heart size is exaggerated by low lung volumes.  Mild pulmonary vascular congestion is stable.  IMPRESSION:  1.  The support apparatus is stable. 2.  Mild pulmonary vascular congestion.   Original Report Authenticated By: Jamesetta Orleans. MATTERN, M.D.    Dg Chest Port 1 View  06-21-2012  *RADIOLOGY REPORT*  Clinical Data: Right internal jugular line placement  PORTABLE CHEST - 1 VIEW  Comparison: Earlier same day; 06/17/2012  Findings:  Grossly unchanged enlarged cardiac silhouette and mediastinal contours.  A linear vertically oriented structure overlies the inferior aspect of the right neck and may represent the distal end of the reported right jugular line placement.  Interval removal of external pacing pads.  Otherwise, stable positioning of support apparatus.  Pulmonary vasculature remains  indistinct.  Fluid is again seen within the minor fissure.  Small bilateral effusions are again suspected. No new focal airspace opacities.  No definite pneumothorax.  Unchanged bones.  IMPRESSION: 1.  Right jugular approach central venous catheter tip is high- riding, overlying the inferior aspect  the right lower neck. Advancement is advised. 2.  Otherwise, stable positioning of support apparatus.  No pneumothorax. 3.  Grossly unchanged findings of mild pulmonary edema and small bilateral effusions.   Original Report Authenticated By: Waynard Reeds, M.D.    Dg Chest Port 1 View  06/19/2012  *RADIOLOGY REPORT*  Clinical Data: 41 year old female with respiratory arrest.  Central line placement, query pneumothorax.  PORTABLE CHEST - 1 VIEW  Comparison: 06/18/2012 and earlier.  Findings: Portable semi upright AP view 0920 hours.  Dual lumen right IJ approach central venous catheter in place.  No evidence of right pneumothorax.  Endotracheal tube now in place, tip between level of clavicles and carina.  Resuscitation pad on the left chest.  Improved lung volumes since 06/17/2012.  Probable fluid in the right minor fissure.  Patchy opacity at the left base.  No overt pulmonary edema.  Stable cardiac size and mediastinal contours.  IMPRESSION: 1.  Endotracheal tube tip in good position.  Stable dual lumen right IJ central line with no associated pneumothorax. 2.  Small right pleural effusion suspected.  Patchy nonspecific opacity at the left base.   Original Report Authenticated By: Harley Hallmark, M.D.      ASSESSMENT AND PLAN  PULMONARY  Lab 06/20/12 1158 06/20/12 1122 06/20/12 0541 06/20/12 0535 06/20/12 0345 06/20/12 0146 06/20/12 0005  PHART 7.531* -- 7.535* -- 7.577* 7.502* 7.508*  PCO2ART 38.8 -- 33.5* -- 27.9* 33.6* 31.8*  PO2ART 80.0 -- 93.0 -- 88.0 90.0 142.0*  HCO3 32.5* 31.7* 28.5* 27.0* 26.1* -- --  O2SAT 97.0 60.0 98.0 58.0 98.0 -- --   Ventilator Settings: Vent Mode:  [-] PRVC FiO2 (%):  [40 %-50 %] 40 % Set Rate:  [14 bmp-20 bmp] 16 bmp Vt Set:  [500 mL-600 mL] 500 mL PEEP:  [5 cmH20] 5 cmH20 Plateau Pressure:  [19 cmH20-20 cmH20] 20 cmH20  ETT: 10/25>>   A:  Respiratory arrest s/p cardiopulmonary arrest.  P:   Full vent support until more stable F/u  CXR  CARDIOVASCULAR  Lab 06/19/12 1330 06/17/12 1633 06/17/12 1148  TROPONINI -- -- --  LATICACIDVEN 4.4* -- 1.3  PROBNP -- 16777.0* --    Lines: Subclavian hemodialysis catheter, peripheral IV left upper arm, right IJ short triple lumen.  A:  Cardiac arrest Shock: likely from GI bleed P:  Transfuse as needed for bleeding Wean off pressors to keep SBP > 90, MAP > 65   RENAL  Lab 06/20/12 1209 06/20/12 1158 06/20/12 1122 06/20/12 0541 06/20/12 0535 06/20/12 0345 06/19/12 2215 06/19/12 1600 06/19/12 0918 06/18/12 0200  NA 138 137 139 139 139 -- -- -- -- --  K 3.7 3.6 -- -- -- -- -- -- -- --  CL 92* -- -- -- -- 96 98 93* 95* --  CO2 27 -- -- -- -- 23 19 17* 11* --  BUN 94* -- -- -- -- 108* 123* 132* 121* --  CREATININE 3.81* -- -- -- -- 4.28* 4.80* 5.13* 5.14* --  CALCIUM 9.7 -- -- -- -- 9.2 8.8 8.4 8.5 --  MG -- -- -- -- -- 2.6* -- -- -- 3.8*  PHOS 6.6* -- -- -- -- 6.0* 7.6* 9.9* -- 9.1*  Intake/Output      10/24 0701 - 10/25 0700 10/25 0701 - 10/26 0700   P.O.     I.V. (mL/kg) 2851.7 (31.5) 694.8 (7.7)   Blood 711.4    Other 20    IV Piggyback 350 100   Total Intake(mL/kg) 3933 (43.4) 794.8 (8.8)   Urine (mL/kg/hr)     Emesis/NG output 100    Other 1897 397   Total Output 1997 397   Net +1936 +397.8        Stool Occurrence 6 x     Foley:  No Foley patient is anuric.  A:   Hyperkalemia Hypervolemia Metabolic acidosis: + anion gap, likely d/t end-organ hypoperfusion P:   CRRT per renal>>limit access F/u BMET, ABG  GASTROINTESTINAL  Lab 06/20/12 1209 06/20/12 0345 06/19/12 2215 06/19/12 1600 06/18/12 0200  AST -- -- -- -- 40*  ALT -- -- -- -- 18  ALKPHOS -- -- -- -- 211*  BILITOT -- -- -- -- 0.1*  PROT -- -- -- -- 7.3  ALBUMIN 1.7* 1.9* 1.8* 1.8* 2.3*    A:  GI bleed. P:   Consult GI patient: Dr Elnoria Howard will see Continue protonix Hemoccult stools  HEMATOLOGIC  Lab 06/20/12 1209 06/20/12 1158 06/20/12 1122 06/20/12 0541 06/20/12 0535 06/20/12  0345 06/19/12 2215 06/19/12 1500 06/19/12 0918  HGB 7.0* 7.5* 8.5* 7.5* 10.9* -- -- -- --  HCT 20.3* 22.0* 25.0* 22.0* 32.0* -- -- -- --  PLT 288 -- -- -- -- 397 385 402* 489*  INR -- -- -- -- -- -- -- -- 2.06*  APTT -- -- -- -- -- -- -- -- 42*   A:  Anemia related to GI bleeding P:  Transfuse PRBCs as needed for bleeding Monitor frequent H&H  INFECTIOUS  Lab 06/20/12 1209 06/20/12 0345 06/19/12 2215 06/19/12 1500 06/19/12 0918  WBC 40.8* 51.9* 44.1* 35.7* 40.9*  PROCALCITON -- -- -- -- --   Cultures: C. difficile negative -10/23 MRSA negative by PCR 10/23  Antibiotics: Augmentin 10/23>>>10/24 Zosyn 10/24>>> Vancomycin during dialysis 10/24>>>  A:  Left lower extremity cellulitis P:   Continue antibiotics Consult pharmacy to dose vancomycin while patient is on CRRT   ENDOCRINE  Lab 06/20/12 1159 06/20/12 1112 06/20/12 1009 06/20/12 0915 06/20/12 0831  GLUCAP 185* 153* 141* 151* 152*   A:  Hyperglycemia  P:   Insulin gtt   NEUROLOGIC  A:  Acute Encephalopathy, possible anoxia  P:   Status post code Reevaluate patient's neuro status once the patient has recovered from hyperkalemia in the sedation has been reduced  BEST PRACTICE / DISPOSITION Level of Care:  Critical Primary Service:  Critical care services Consultants:  GI Code Status:  Full (per d/w NP Anders Simmonds who d/w family  - full code for now but if HD fails family open to discussions about palliation) Diet:  N.p.o. DVT Px:  None at this time possible GI bleed, no heparin. Lower extremities wounds no SCDs GI Px:  Protonix Social / Family:  Patient was contacted postcode two family members were at bedside  Reviewed above, examined pt, and agree with assessment/plan.  She remains on full vent support, and pressors.  Will need to have GI evaluate bleeding.  CRRT per renal.  Continue ABx.  Unsure of mental status with concern for anoxic injury during cardiac arrest.  Critical care time 35  minutes.  Coralyn Helling, MD [facility] Pulmonary/Critical Care 06/20/2012, 2:04 PM Pager:  [phone] After 3pm call: [phone]

## 2012-06-20 NOTE — Consult Note (Signed)
Reason for Consult: Heme positive stool and anemia Referring Physician: CCM  [name] HPI: This is a 41 year old female who was admitted to the ICU after a cardiac arrest.  It was felt to be secondary to hyperkalemia as she did not take her dialysis.  She was identified to have heme positive stools and anemia, but no other evidence of GI bleed.  As a result of the findings a GI consultation was requested for further evaluation.  Past Medical History  Diagnosis Date  . DVT (deep venous thrombosis)     DVT HISTORY  . Diabetes mellitus   . Migraine     HISTORY  . Anxiety and depression   . CAD (coronary artery disease)     Last cath 2009:  LAD stent 80% stenosis. Circumflex 60-70% stenosis AV groove, right coronary artery 50-60% stenosis. She did have cutting balloon angioplasty of the LAD lesion into a diagonal. However, there was restenosis of this and it was managed medically.  . Bipolar 1 disorder   . Hyperlipidemia   . Hyperparathyroidism   . PE (pulmonary embolism)     HISTORY  . Dialysis patient   . Anemia   . Renal failure     dialysis [place] t,th sat  . Nephrotic syndrome   . Peripheral vascular disease   . MRSA infection   . Anxiety   . Depression   . Gastroparesis   . Complication of anesthesia   . PONV (postoperative nausea and vomiting)     x3 days post anesth. on 12/19/2011  . Myocardial infarction     x3 , last one - 10 yrs. ago  . Hypertension     sees Dr. Antoine Poche - , last stress test 11/2011, see result in EPIC, saw Dr. Antoine Poche as well at that time    . Shortness of breath   . Recurrent upper respiratory infection (URI)     Bronchitis 11/2011 - saw Dr. Loney Hering, treating currently /w antibiotic   . GERD (gastroesophageal reflux disease)   . Pneumonia     [facility]- 2012  . Noncompliance of patient with renal dialysis   . Cellulitis Oct. 2013  . MRSA (methicillin resistant staph aureus) culture positive Oct 2013    Past Surgical History  Procedure  Date  . Incise and drain abcess     OF THIGHS FROM INSULIN INJECTIONS  . Heart stents   . Portacath placement 2003  . Cataract extraction w/phaco 11/09/2011    Procedure: CATARACT EXTRACTION PHACO AND INTRAOCULAR LENS PLACEMENT (IOC);  Surgeon: Edmon Crape, MD;  Location: [facility] OR;  Service: Ophthalmology;  Laterality: Right;  . Pars plana vitrectomy 11/09/2011    Procedure: PARS PLANA VITRECTOMY WITH 23 GAUGE;  Surgeon: Edmon Crape, MD;  Location: [facility] OR;  Service: Ophthalmology;  Laterality: Right;  Ahmed Valve; Scleral Reinforcement Graft  . Dg av dialysis  shunt access exist*l* or 12/13/11    left arm  . Dialysis cath inserted & portacath      dialysis catheter inserted & portacath d/c'd- 12/19/2011  . Back surgery X 4    x4 times   . Cholecystectomy   . Eye surgery   . Cardiac catheterization   . Aorta - bilateral femoral artery bypass graft 12/28/2011    Procedure: AORTA BIFEMORAL BYPASS GRAFT;  Surgeon: Nada Libman, MD;  Location: [facility] OR;  Service: Vascular;  Laterality: N/A;  AortoBifemoral Bypass using hemasheild 14x7 graft  . Femoral-popliteal bypass graft 12/28/2011  Procedure: BYPASS GRAFT FEMORAL-POPLITEAL ARTERY;  Surgeon: Nada Libman, MD;  Location: [facility] OR;  Service: Vascular;  Laterality: Right;  right femoral popliteal bypass using 6x80 propaten graft  . Insertion of dialysis catheter 02/15/2012    Procedure: INSERTION OF DIALYSIS CATHETER;  Surgeon: Chuck Hint, MD;  Location: [facility] OR;  Service: Vascular;  Laterality: Right;  insertion of dialysis catheter right internal jugular vein,removal of left internal jugular vein dialysis catheter  . Insertion of dialysis catheter 02/21/2012    Procedure: INSERTION OF DIALYSIS CATHETER;  Surgeon: Chuck Hint, MD;  Location: [facility] OR;  Service: Cardiovascular;  Laterality: N/A;  Insertion new dialysis catheter; possible venoplasty of fibrin sheath of  Right IJ catheter  . Venogram 02/21/2012    Procedure: VENOGRAM;   Surgeon: Chuck Hint, MD;  Location: [facility] OR;  Service: Cardiovascular;  Laterality: N/A;  possible venoplasty  . Insertion of dialysis catheter 04/23/2012    Procedure: INSERTION OF DIALYSIS CATHETER;  Surgeon: Sherren Kerns, MD;  Location: [facility] OR;  Service: Vascular;  Laterality: Right;  using a 38fr. x 23 cannon catheter in Right Internal Jugular    Family History  Problem Relation Age of Onset  . Transient ischemic attack Mother   . Hepatitis Sister   . Diabetes type II Other   . Anesthesia problems Neg Hx   . Hypotension Neg Hx   . Malignant hyperthermia Neg Hx   . Pseudochol deficiency Neg Hx     Social History:  reports that she has been smoking Cigarettes.  She has a 60 pack-year smoking history. She has never used smokeless tobacco. She reports that she does not drink alcohol or use illicit drugs.  Allergies:  Allergies  Allergen Reactions  . Compazine (Prochlorperazine) Swelling    Per echart records (takes promethazine at home)  . Contrast Media (Iodinated Diagnostic Agents) Swelling    Per echart records  . Meperidine Hcl Swelling  . Metformin And Related Other (See Comments)    'just not myself, was told not to take metformin'  . Morphine Swelling  . Nubain (Nalbuphine Hcl) Swelling  . Tobramycin Other (See Comments)    Unknown reaction  . Topiramate (Topamax) Swelling    Tongue swelling - per echart records  . Toradol (Ketorolac Tromethamine) Swelling    Medications:  Scheduled:   . alteplase  4 mg Intracatheter Once  . alteplase  2 mg Intracatheter Once  . antiseptic oral rinse  15 mL Mouth Rinse QID  . bacitracin-polymyxin b   Both Eyes QHS  . chlorhexidine  15 mL Mouth/Throat BID  . darbepoetin (ARANESP) injection - DIALYSIS  150 mcg Intravenous Q Thu-HD  . ferric gluconate (FERRLECIT/NULECIT) IV  125 mg Intravenous Weekly  . midazolam  2-4 mg Intravenous Once  . mupirocin cream   Topical Daily  . ofloxacin  1 drop Both Eyes TID  .  piperacillin-tazobactam  3.375 g Intravenous Q6H  . sodium chloride  3 mL Intravenous Q12H  . vancomycin  1,000 mg Intravenous Q24H   Continuous:   . sodium chloride 20 mL/hr at 06/20/12 0500  . calcium gluconate infusion for CRRT 20 g (06/19/12 2100)  . fentaNYL infusion INTRAVENOUS    . insulin (NOVOLIN-R) infusion 2 Units/hr (06/20/12 1400)  . norepinephrine (LEVOPHED) Adult infusion 6 mcg/min (06/20/12 1000)  . pantoprozole (PROTONIX) infusion 8 mg/hr (06/20/12 1523)  . dialysis replacement fluid (prismasate) 500 mL/hr at 06/20/12 1121  . dialysate (PRISMASATE) 1,000 mL/hr at 06/20/12 1145  .  propofol    . sodium citrate 2 %/dextrose 2.5% solution 3000 mL 280 mL/hr at 06/20/12 0724  . DISCONTD: sodium chloride 1,000 mL (06/19/12 1323)  . DISCONTD: dialysis replacement fluid (prismasate) 500 mL/hr at 06/19/12 1316  . DISCONTD: dialysis replacement fluid (prismasate) 500 mL/hr at 06/20/12 0545  . DISCONTD: dialysate (PRISMASATE) 1,000 mL/hr at 06/20/12 0700  . DISCONTD:  sodium bicarbonate infusion 1000 mL Stopped (06/19/12 1320)    Results for orders placed during the hospital encounter of 06/17/12 (from the past 24 hour(s))  GLUCOSE, CAPILLARY     Status: Abnormal   Collection Time   06/19/12  3:26 PM      Component Value Range   Glucose-Capillary 503 (*) 70 - 99 mg/dL  RENAL FUNCTION PANEL     Status: Abnormal   Collection Time   06/19/12  4:00 PM      Component Value Range   Sodium 135  135 - 145 mEq/L   Potassium 6.0 (*) 3.5 - 5.1 mEq/L   Chloride 93 (*) 96 - 112 mEq/L   CO2 17 (*) 19 - 32 mEq/L   Glucose, Bld 632 (*) 70 - 99 mg/dL   BUN 161 (*) 6 - 23 mg/dL   Creatinine, Ser 0.96 (*) 0.50 - 1.10 mg/dL   Calcium 8.4  8.4 - 04.5 mg/dL   Phosphorus 9.9 (*) 2.3 - 4.6 mg/dL   Albumin 1.8 (*) 3.5 - 5.2 g/dL   GFR calc non Af Amer 9 (*) >90 mL/min   GFR calc Af Amer 11 (*) >90 mL/min  GLUCOSE, CAPILLARY     Status: Abnormal   Collection Time   06/19/12  4:04 PM       Component Value Range   Glucose-Capillary 541 (*) 70 - 99 mg/dL  GLUCOSE, CAPILLARY     Status: Abnormal   Collection Time   06/19/12  4:49 PM      Component Value Range   Glucose-Capillary >600 (*) 70 - 99 mg/dL  GLUCOSE, CAPILLARY     Status: Abnormal   Collection Time   06/19/12  5:59 PM      Component Value Range   Glucose-Capillary 515 (*) 70 - 99 mg/dL  GLUCOSE, CAPILLARY     Status: Abnormal   Collection Time   06/19/12  6:58 PM      Component Value Range   Glucose-Capillary 439 (*) 70 - 99 mg/dL  GLUCOSE, CAPILLARY     Status: Abnormal   Collection Time   06/19/12  8:09 PM      Component Value Range   Glucose-Capillary 400 (*) 70 - 99 mg/dL  GLUCOSE, CAPILLARY     Status: Abnormal   Collection Time   06/19/12  8:58 PM      Component Value Range   Glucose-Capillary 341 (*) 70 - 99 mg/dL  GLUCOSE, CAPILLARY     Status: Abnormal   Collection Time   06/19/12 10:00 PM      Component Value Range   Glucose-Capillary 272 (*) 70 - 99 mg/dL  POCT ACTIVATED CLOTTING TIME     Status: Normal   Collection Time   06/19/12 10:01 PM      Component Value Range   Activated Clotting Time 154    POCT I-STAT 7, (EG7 V)     Status: Abnormal   Collection Time   06/19/12 10:10 PM      Component Value Range   pH, Ven 7.298  7.250 - 7.300   pCO2, Ven 47.5  45.0 - 50.0 mmHg   pO2, Ven 29.0 (*) 30.0 - 45.0 mmHg   Bicarbonate 23.4  20.0 - 24.0 mEq/L   TCO2 25  0 - 100 mmol/L   O2 Saturation 50.0     Acid-base deficit 3.0 (*) 0.0 - 2.0 mmol/L   Sodium 140  135 - 145 mEq/L   Potassium 2.6 (*) 3.5 - 5.1 mEq/L   Calcium, Ion 0.42 (*) 1.12 - 1.23 mmol/L   HCT 29.0 (*) 36.0 - 46.0 %   Hemoglobin 9.9 (*) 12.0 - 15.0 g/dL   Patient temperature 16.1 F     Collection site CVVH     Drawn by Nurse     Sample type VENOUS     Comment NOTIFIED PHYSICIAN    CBC     Status: Abnormal   Collection Time   06/19/12 10:15 PM      Component Value Range   WBC 44.1 (*) 4.0 - 10.5 K/uL   RBC 2.63 (*)  3.87 - 5.11 MIL/uL   Hemoglobin 7.8 (*) 12.0 - 15.0 g/dL   HCT 09.6 (*) 04.5 - 40.9 %   MCV 87.8  78.0 - 100.0 fL   MCH 29.7  26.0 - 34.0 pg   MCHC 33.8  30.0 - 36.0 g/dL   RDW 81.1 (*) 91.4 - 78.2 %   Platelets 385  150 - 400 K/uL  RENAL FUNCTION PANEL     Status: Abnormal   Collection Time   06/19/12 10:15 PM      Component Value Range   Sodium 140  135 - 145 mEq/L   Potassium 4.4  3.5 - 5.1 mEq/L   Chloride 98  96 - 112 mEq/L   CO2 19  19 - 32 mEq/L   Glucose, Bld 259 (*) 70 - 99 mg/dL   BUN 956 (*) 6 - 23 mg/dL   Creatinine, Ser 2.13 (*) 0.50 - 1.10 mg/dL   Calcium 8.8  8.4 - 08.6 mg/dL   Phosphorus 7.6 (*) 2.3 - 4.6 mg/dL   Albumin 1.8 (*) 3.5 - 5.2 g/dL   GFR calc non Af Amer 10 (*) >90 mL/min   GFR calc Af Amer 12 (*) >90 mL/min  POCT I-STAT 7, (LYTES, BLD GAS, ICA,H+H)     Status: Abnormal   Collection Time   06/19/12 10:22 PM      Component Value Range   pH, Arterial 7.435  7.350 - 7.450   pCO2 arterial 30.9 (*) 35.0 - 45.0 mmHg   pO2, Arterial 149.0 (*) 80.0 - 100.0 mmHg   Bicarbonate 20.9  20.0 - 24.0 mEq/L   TCO2 22  0 - 100 mmol/L   O2 Saturation 99.0     Acid-base deficit 3.0 (*) 0.0 - 2.0 mmol/L   Sodium 139  135 - 145 mEq/L   Potassium 4.5  3.5 - 5.1 mEq/L   Calcium, Ion 1.13  1.12 - 1.23 mmol/L   HCT 24.0 (*) 36.0 - 46.0 %   Hemoglobin 8.2 (*) 12.0 - 15.0 g/dL   Patient temperature 57.8 F     Collection site ARTERIAL LINE     Drawn by Nurse     Sample type ARTERIAL    GLUCOSE, CAPILLARY     Status: Abnormal   Collection Time   06/19/12 11:01 PM      Component Value Range   Glucose-Capillary 240 (*) 70 - 99 mg/dL  GLUCOSE, CAPILLARY     Status: Abnormal   Collection Time   06/19/12  11:56 PM      Component Value Range   Glucose-Capillary 191 (*) 70 - 99 mg/dL  POCT I-STAT 7, (EG7 V)     Status: Abnormal   Collection Time   06/19/12 11:57 PM      Component Value Range   pH, Ven 7.348 (*) 7.250 - 7.300   pCO2, Ven 43.9 (*) 45.0 - 50.0 mmHg    pO2, Ven 28.0 (*) 30.0 - 45.0 mmHg   Bicarbonate 24.2 (*) 20.0 - 24.0 mEq/L   TCO2 26  0 - 100 mmol/L   O2 Saturation 50.0     Acid-base deficit 2.0  0.0 - 2.0 mmol/L   Sodium 140  135 - 145 mEq/L   Potassium 2.3 (*) 3.5 - 5.1 mEq/L   Calcium, Ion 0.38 (*) 1.12 - 1.23 mmol/L   HCT 29.0 (*) 36.0 - 46.0 %   Hemoglobin 9.9 (*) 12.0 - 15.0 g/dL   Patient temperature 16.1 F     Collection site CVVH     Drawn by Nurse     Sample type VENOUS     Comment NOTIFIED PHYSICIAN    POCT I-STAT 7, (LYTES, BLD GAS, ICA,H+H)     Status: Abnormal   Collection Time   06/20/12 12:05 AM      Component Value Range   pH, Arterial 7.508 (*) 7.350 - 7.450   pCO2 arterial 31.8 (*) 35.0 - 45.0 mmHg   pO2, Arterial 142.0 (*) 80.0 - 100.0 mmHg   Bicarbonate 25.3 (*) 20.0 - 24.0 mEq/L   TCO2 26  0 - 100 mmol/L   O2 Saturation 99.0     Acid-Base Excess 2.0  0.0 - 2.0 mmol/L   Sodium 139  135 - 145 mEq/L   Potassium 4.4  3.5 - 5.1 mEq/L   Calcium, Ion 1.13  1.12 - 1.23 mmol/L   HCT 24.0 (*) 36.0 - 46.0 %   Hemoglobin 8.2 (*) 12.0 - 15.0 g/dL   Patient temperature 09.6 F     Collection site ARTERIAL LINE     Drawn by Nurse     Sample type ARTERIAL    GLUCOSE, CAPILLARY     Status: Abnormal   Collection Time   06/20/12  1:01 AM      Component Value Range   Glucose-Capillary 181 (*) 70 - 99 mg/dL  POCT I-STAT 7, (EG7 V)     Status: Abnormal   Collection Time   06/20/12  1:38 AM      Component Value Range   pH, Ven 7.339 (*) 7.250 - 7.300   pCO2, Ven 46.9  45.0 - 50.0 mmHg   pO2, Ven 24.0 (*) 30.0 - 45.0 mmHg   Bicarbonate 25.3 (*) 20.0 - 24.0 mEq/L   TCO2 27  0 - 100 mmol/L   O2 Saturation 39.0     Acid-base deficit 1.0  0.0 - 2.0 mmol/L   Sodium 140  135 - 145 mEq/L   Potassium 2.1 (*) 3.5 - 5.1 mEq/L   Calcium, Ion 0.34 (*) 1.12 - 1.23 mmol/L   HCT 29.0 (*) 36.0 - 46.0 %   Hemoglobin 9.9 (*) 12.0 - 15.0 g/dL   Patient temperature 04.5 F     Collection site CVVH     Drawn by Nurse     Sample  type VENOUS     Comment VALUES EXPECTED, NO REPEAT    POCT I-STAT 7, (LYTES, BLD GAS, ICA,H+H)     Status: Abnormal   Collection Time  06/20/12  1:46 AM      Component Value Range   pH, Arterial 7.502 (*) 7.350 - 7.450   pCO2 arterial 33.6 (*) 35.0 - 45.0 mmHg   pO2, Arterial 90.0  80.0 - 100.0 mmHg   Bicarbonate 26.4 (*) 20.0 - 24.0 mEq/L   TCO2 27  0 - 100 mmol/L   O2 Saturation 98.0     Acid-Base Excess 3.0 (*) 0.0 - 2.0 mmol/L   Sodium 140  135 - 145 mEq/L   Potassium 3.9  3.5 - 5.1 mEq/L   Calcium, Ion 1.15  1.12 - 1.23 mmol/L   HCT 24.0 (*) 36.0 - 46.0 %   Hemoglobin 8.2 (*) 12.0 - 15.0 g/dL   Patient temperature 16.1 F     Collection site ARTERIAL LINE     Drawn by Nurse     Sample type ARTERIAL    GLUCOSE, CAPILLARY     Status: Abnormal   Collection Time   06/20/12  1:55 AM      Component Value Range   Glucose-Capillary 131 (*) 70 - 99 mg/dL  GLUCOSE, CAPILLARY     Status: Abnormal   Collection Time   06/20/12  3:00 AM      Component Value Range   Glucose-Capillary 102 (*) 70 - 99 mg/dL  POCT I-STAT 7, (EG7 V)     Status: Abnormal   Collection Time   06/20/12  3:38 AM      Component Value Range   pH, Ven 7.471 (*) 7.250 - 7.300   pCO2, Ven 36.4 (*) 45.0 - 50.0 mmHg   pO2, Ven 24.0 (*) 30.0 - 45.0 mmHg   Bicarbonate 26.6 (*) 20.0 - 24.0 mEq/L   TCO2 28  0 - 100 mmol/L   O2 Saturation 48.0     Acid-Base Excess 3.0 (*) 0.0 - 2.0 mmol/L   Sodium 139  135 - 145 mEq/L   Potassium 3.0 (*) 3.5 - 5.1 mEq/L   Calcium, Ion 0.58 (*) 1.12 - 1.23 mmol/L   HCT 28.0 (*) 36.0 - 46.0 %   Hemoglobin 9.5 (*) 12.0 - 15.0 g/dL   Patient temperature 09.6 F     Collection site CVVH     Drawn by Nurse     Sample type VENOUS     Comment VALUES EXPECTED, NO REPEAT    RENAL FUNCTION PANEL     Status: Abnormal   Collection Time   06/20/12  3:45 AM      Component Value Range   Sodium 139  135 - 145 mEq/L   Potassium 3.9  3.5 - 5.1 mEq/L   Chloride 96  96 - 112 mEq/L   CO2 23   19 - 32 mEq/L   Glucose, Bld 133 (*) 70 - 99 mg/dL   BUN 045 (*) 6 - 23 mg/dL   Creatinine, Ser 4.09 (*) 0.50 - 1.10 mg/dL   Calcium 9.2  8.4 - 81.1 mg/dL   Phosphorus 6.0 (*) 2.3 - 4.6 mg/dL   Albumin 1.9 (*) 3.5 - 5.2 g/dL   GFR calc non Af Amer 12 (*) >90 mL/min   GFR calc Af Amer 14 (*) >90 mL/min  CBC     Status: Abnormal   Collection Time   06/20/12  3:45 AM      Component Value Range   WBC 51.9 (*) 4.0 - 10.5 K/uL   RBC 2.64 (*) 3.87 - 5.11 MIL/uL   Hemoglobin 7.8 (*) 12.0 - 15.0 g/dL   HCT  22.8 (*) 36.0 - 46.0 %   MCV 86.4  78.0 - 100.0 fL   MCH 29.5  26.0 - 34.0 pg   MCHC 34.2  30.0 - 36.0 g/dL   RDW 16.1 (*) 09.6 - 04.5 %   Platelets 397  150 - 400 K/uL  MAGNESIUM     Status: Abnormal   Collection Time   06/20/12  3:45 AM      Component Value Range   Magnesium 2.6 (*) 1.5 - 2.5 mg/dL  POCT I-STAT 7, (LYTES, BLD GAS, ICA,H+H)     Status: Abnormal   Collection Time   06/20/12  3:45 AM      Component Value Range   pH, Arterial 7.577 (*) 7.350 - 7.450   pCO2 arterial 27.9 (*) 35.0 - 45.0 mmHg   pO2, Arterial 88.0  80.0 - 100.0 mmHg   Bicarbonate 26.1 (*) 20.0 - 24.0 mEq/L   TCO2 27  0 - 100 mmol/L   O2 Saturation 98.0     Acid-Base Excess 4.0 (*) 0.0 - 2.0 mmol/L   Sodium 139  135 - 145 mEq/L   Potassium 3.9  3.5 - 5.1 mEq/L   Calcium, Ion 1.11 (*) 1.12 - 1.23 mmol/L   HCT 24.0 (*) 36.0 - 46.0 %   Hemoglobin 8.2 (*) 12.0 - 15.0 g/dL   Patient temperature 40.9 F     Collection site ARTERIAL LINE     Drawn by Nurse     Sample type ARTERIAL    GLUCOSE, CAPILLARY     Status: Abnormal   Collection Time   06/20/12  3:55 AM      Component Value Range   Glucose-Capillary 115 (*) 70 - 99 mg/dL  CALCIUM, IONIZED     Status: Abnormal   Collection Time   06/20/12  5:00 AM      Component Value Range   Calcium, Ion 1.10 (*) 1.12 - 1.32 mmol/L  GLUCOSE, CAPILLARY     Status: Abnormal   Collection Time   06/20/12  5:09 AM      Component Value Range    Glucose-Capillary 118 (*) 70 - 99 mg/dL  POCT I-STAT 7, (EG7 V)     Status: Abnormal   Collection Time   06/20/12  5:35 AM      Component Value Range   pH, Ven 7.359 (*) 7.250 - 7.300   pCO2, Ven 47.5  45.0 - 50.0 mmHg   pO2, Ven 30.0  30.0 - 45.0 mmHg   Bicarbonate 27.0 (*) 20.0 - 24.0 mEq/L   TCO2 29  0 - 100 mmol/L   O2 Saturation 58.0     Acid-Base Excess 1.0  0.0 - 2.0 mmol/L   Sodium 139  135 - 145 mEq/L   Potassium <2.0 (*) 3.5 - 5.1 mEq/L   Calcium, Ion 0.27 (*) 1.12 - 1.23 mmol/L   HCT 32.0 (*) 36.0 - 46.0 %   Hemoglobin 10.9 (*) 12.0 - 15.0 g/dL   Patient temperature 81.1 F     Collection site CVVH     Drawn by Nurse     Sample type VENOUS     Comment VALUES EXPECTED, NO REPEAT    POCT I-STAT 7, (LYTES, BLD GAS, ICA,H+H)     Status: Abnormal   Collection Time   06/20/12  5:41 AM      Component Value Range   pH, Arterial 7.535 (*) 7.350 - 7.450   pCO2 arterial 33.5 (*) 35.0 - 45.0 mmHg   pO2,  Arterial 93.0  80.0 - 100.0 mmHg   Bicarbonate 28.5 (*) 20.0 - 24.0 mEq/L   TCO2 30  0 - 100 mmol/L   O2 Saturation 98.0     Acid-Base Excess 5.0 (*) 0.0 - 2.0 mmol/L   Sodium 139  135 - 145 mEq/L   Potassium 3.6  3.5 - 5.1 mEq/L   Calcium, Ion 1.16  1.12 - 1.23 mmol/L   HCT 22.0 (*) 36.0 - 46.0 %   Hemoglobin 7.5 (*) 12.0 - 15.0 g/dL   Patient temperature 09.8 F     Collection site ARTERIAL LINE     Drawn by Nurse     Sample type ARTERIAL    GLUCOSE, CAPILLARY     Status: Abnormal   Collection Time   06/20/12  5:59 AM      Component Value Range   Glucose-Capillary 119 (*) 70 - 99 mg/dL  GLUCOSE, CAPILLARY     Status: Abnormal   Collection Time   06/20/12  6:58 AM      Component Value Range   Glucose-Capillary 116 (*) 70 - 99 mg/dL  GLUCOSE, CAPILLARY     Status: Abnormal   Collection Time   06/20/12  8:31 AM      Component Value Range   Glucose-Capillary 152 (*) 70 - 99 mg/dL  POCT I-STAT 7, (EG7 V)     Status: Abnormal   Collection Time   06/20/12  8:54 AM       Component Value Range   pH, Ven 7.440 (*) 7.250 - 7.300   pCO2, Ven 42.4 (*) 45.0 - 50.0 mmHg   pO2, Ven 27.0 (*) 30.0 - 45.0 mmHg   Bicarbonate 28.8 (*) 20.0 - 24.0 mEq/L   TCO2 30  0 - 100 mmol/L   O2 Saturation 53.0     Acid-Base Excess 4.0 (*) 0.0 - 2.0 mmol/L   Sodium 139  135 - 145 mEq/L   Potassium 2.8 (*) 3.5 - 5.1 mEq/L   Calcium, Ion 0.58 (*) 1.12 - 1.23 mmol/L   HCT 27.0 (*) 36.0 - 46.0 %   Hemoglobin 9.2 (*) 12.0 - 15.0 g/dL   Patient temperature 11.9 F     Collection site CVVH     Drawn by Nurse     Sample type MIXED VENOUS SAMPLE     Comment NOTIFIED PHYSICIAN    POCT I-STAT 7, (LYTES, BLD GAS, ICA,H+H)     Status: Abnormal   Collection Time   06/20/12  9:14 AM      Component Value Range   pH, Arterial 7.577 (*) 7.350 - 7.450   pCO2 arterial 34.1 (*) 35.0 - 45.0 mmHg   pO2, Arterial 75.0 (*) 80.0 - 100.0 mmHg   Bicarbonate 31.8 (*) 20.0 - 24.0 mEq/L   TCO2 33  0 - 100 mmol/L   O2 Saturation 97.0     Acid-Base Excess 9.0 (*) 0.0 - 2.0 mmol/L   Sodium 138  135 - 145 mEq/L   Potassium 3.7  3.5 - 5.1 mEq/L   Calcium, Ion 1.11 (*) 1.12 - 1.23 mmol/L   HCT 24.0 (*) 36.0 - 46.0 %   Hemoglobin 8.2 (*) 12.0 - 15.0 g/dL   Patient temperature 14.7 F     Collection site ARTERIAL LINE     Drawn by Nurse     Sample type ARTERIAL    GLUCOSE, CAPILLARY     Status: Abnormal   Collection Time   06/20/12  9:15 AM  Component Value Range   Glucose-Capillary 151 (*) 70 - 99 mg/dL  GLUCOSE, CAPILLARY     Status: Abnormal   Collection Time   06/20/12 10:09 AM      Component Value Range   Glucose-Capillary 141 (*) 70 - 99 mg/dL  GLUCOSE, CAPILLARY     Status: Abnormal   Collection Time   06/20/12 11:12 AM      Component Value Range   Glucose-Capillary 153 (*) 70 - 99 mg/dL  POCT I-STAT 7, (EG7 V)     Status: Abnormal   Collection Time   06/20/12 11:22 AM      Component Value Range   pH, Ven 7.487 (*) 7.250 - 7.300   pCO2, Ven 41.8 (*) 45.0 - 50.0 mmHg   pO2, Ven  29.0 (*) 30.0 - 45.0 mmHg   Bicarbonate 31.7 (*) 20.0 - 24.0 mEq/L   TCO2 33  0 - 100 mmol/L   O2 Saturation 60.0     Acid-Base Excess 8.0 (*) 0.0 - 2.0 mmol/L   Sodium 139  135 - 145 mEq/L   Potassium 3.5  3.5 - 5.1 mEq/L   Calcium, Ion 0.96 (*) 1.12 - 1.23 mmol/L   HCT 25.0 (*) 36.0 - 46.0 %   Hemoglobin 8.5 (*) 12.0 - 15.0 g/dL   Patient temperature 16.1 F     Collection site CVVH     Drawn by Nurse     Sample type MIXED VENOUS SAMPLE    POCT I-STAT 7, (LYTES, BLD GAS, ICA,H+H)     Status: Abnormal   Collection Time   06/20/12 11:58 AM      Component Value Range   pH, Arterial 7.531 (*) 7.350 - 7.450   pCO2 arterial 38.8  35.0 - 45.0 mmHg   pO2, Arterial 80.0  80.0 - 100.0 mmHg   Bicarbonate 32.5 (*) 20.0 - 24.0 mEq/L   TCO2 34  0 - 100 mmol/L   O2 Saturation 97.0     Acid-Base Excess 9.0 (*) 0.0 - 2.0 mmol/L   Sodium 137  135 - 145 mEq/L   Potassium 3.6  3.5 - 5.1 mEq/L   Calcium, Ion 1.26 (*) 1.12 - 1.23 mmol/L   HCT 22.0 (*) 36.0 - 46.0 %   Hemoglobin 7.5 (*) 12.0 - 15.0 g/dL   Patient temperature 09.6 F     Collection site RADIAL, ALLEN'S TEST ACCEPTABLE     Drawn by Nurse     Sample type ARTERIAL    GLUCOSE, CAPILLARY     Status: Abnormal   Collection Time   06/20/12 11:59 AM      Component Value Range   Glucose-Capillary 185 (*) 70 - 99 mg/dL  RENAL FUNCTION PANEL     Status: Abnormal   Collection Time   06/20/12 12:09 PM      Component Value Range   Sodium 138  135 - 145 mEq/L   Potassium 3.7  3.5 - 5.1 mEq/L   Chloride 92 (*) 96 - 112 mEq/L   CO2 27  19 - 32 mEq/L   Glucose, Bld 188 (*) 70 - 99 mg/dL   BUN 94 (*) 6 - 23 mg/dL   Creatinine, Ser 0.45 (*) 0.50 - 1.10 mg/dL   Calcium 9.7  8.4 - 40.9 mg/dL   Phosphorus 6.6 (*) 2.3 - 4.6 mg/dL   Albumin 1.7 (*) 3.5 - 5.2 g/dL   GFR calc non Af Amer 14 (*) >90 mL/min   GFR calc Af Amer 16 (*) >90  mL/min  CBC     Status: Abnormal   Collection Time   06/20/12 12:09 PM      Component Value Range   WBC 40.8  (*) 4.0 - 10.5 K/uL   RBC 2.30 (*) 3.87 - 5.11 MIL/uL   Hemoglobin 7.0 (*) 12.0 - 15.0 g/dL   HCT 95.6 (*) 21.3 - 08.6 %   MCV 88.3  78.0 - 100.0 fL   MCH 30.4  26.0 - 34.0 pg   MCHC 34.5  30.0 - 36.0 g/dL   RDW 57.8 (*) 46.9 - 62.9 %   Platelets 288  150 - 400 K/uL  GLUCOSE, CAPILLARY     Status: Abnormal   Collection Time   06/20/12  1:12 PM      Component Value Range   Glucose-Capillary 148 (*) 70 - 99 mg/dL  GLUCOSE, CAPILLARY     Status: Abnormal   Collection Time   06/20/12  2:17 PM      Component Value Range   Glucose-Capillary 125 (*) 70 - 99 mg/dL  GLUCOSE, CAPILLARY     Status: Abnormal   Collection Time   06/20/12  2:52 PM      Component Value Range   Glucose-Capillary 133 (*) 70 - 99 mg/dL     Portable Chest Xray In Am  06/20/2012  *RADIOLOGY REPORT*  Clinical Data: Endotracheal tube position.  PORTABLE CHEST - 1 VIEW  Comparison: One-view chest 06/19/2012.  Findings: The endotracheal tube terminates 3.6 cm above the carina. An NG tube is in place.  The right IJ dialysis catheter is stable. The heart size is exaggerated by low lung volumes.  Mild pulmonary vascular congestion is stable.  IMPRESSION:  1.  The support apparatus is stable. 2.  Mild pulmonary vascular congestion.   Original Report Authenticated By: Jamesetta Orleans. MATTERN, M.D.    Dg Chest Port 1 View  06/19/2012  *RADIOLOGY REPORT*  Clinical Data: Right internal jugular line placement  PORTABLE CHEST - 1 VIEW  Comparison: Earlier same day; 06/17/2012  Findings:  Grossly unchanged enlarged cardiac silhouette and mediastinal contours.  A linear vertically oriented structure overlies the inferior aspect of the right neck and may represent the distal end of the reported right jugular line placement.  Interval removal of external pacing pads.  Otherwise, stable positioning of support apparatus.  Pulmonary vasculature remains indistinct.  Fluid is again seen within the minor fissure.  Small bilateral effusions are  again suspected. No new focal airspace opacities.  No definite pneumothorax.  Unchanged bones.  IMPRESSION: 1.  Right jugular approach central venous catheter tip is high- riding, overlying the inferior aspect the right lower neck. Advancement is advised. 2.  Otherwise, stable positioning of support apparatus.  No pneumothorax. 3.  Grossly unchanged findings of mild pulmonary edema and small bilateral effusions.   Original Report Authenticated By: Waynard Reeds, M.D.    Dg Chest Port 1 View  06/19/2012  *RADIOLOGY REPORT*  Clinical Data: 41 year old female with respiratory arrest.  Central line placement, query pneumothorax.  PORTABLE CHEST - 1 VIEW  Comparison: 06/18/2012 and earlier.  Findings: Portable semi upright AP view 0920 hours.  Dual lumen right IJ approach central venous catheter in place.  No evidence of right pneumothorax.  Endotracheal tube now in place, tip between level of clavicles and carina.  Resuscitation pad on the left chest.  Improved lung volumes since 06/17/2012.  Probable fluid in the right minor fissure.  Patchy opacity at the left base.  No  overt pulmonary edema.  Stable cardiac size and mediastinal contours.  IMPRESSION: 1.  Endotracheal tube tip in good position.  Stable dual lumen right IJ central line with no associated pneumothorax. 2.  Small right pleural effusion suspected.  Patchy nonspecific opacity at the left base.   Original Report Authenticated By: Ulla Potash III, M.D.     ROS:  As stated above in the HPI otherwise negative.  Blood pressure 83/45, pulse 104, temperature 98.7 F (37.1 C), temperature source Oral, resp. rate 17, height 6\' 1"  (1.854 m), weight 90.6 kg (199 lb 11.8 oz), last menstrual period 09/03/2008, SpO2 100.00%.    PE: Gen: Intubated. HEENT:  Pillager/AT Neck: Supple, no LAD Lungs: CTA Bilaterally CV: RRR without M/G/R ABM: Soft, NTND, +BS Ext: No C/C/E  Assessment/Plan: 1) Anemia. 2) Heme positive stool. 3) S/p Cardiac arrest.   The  patient does not have any blood in the OG tube.  However, with the progressive anemia and the code situation an EGD will be prudent.  Plan: 1) EGD.  [name] D 06/20/2012, 3:23 PM

## 2012-06-20 NOTE — Significant Event (Signed)
Lab Results  Component Value Date   WBC 46.7* 06/20/2012   HGB 6.5* 06/20/2012   HCT 18.8* 06/20/2012   MCV 89.1 06/20/2012   PLT 265 06/20/2012   Will give 1 unit PRBC.  Coralyn Helling, MD 06/20/2012, 8:36 PM [phone]

## 2012-06-20 NOTE — Procedures (Signed)
Procedure notes  - personally supervised procedure

## 2012-06-20 NOTE — Progress Notes (Signed)
CRITICAL VALUE ALERT  Critical value received: WBC 51.9  Date of notification:  06-20-12  Time of notification:  0417  Critical value read back:yes  Nurse who received alert: Jamesetta So RN  MD notified (1st page):  [place] (CCM resident)  Time of first page: 0430  Time MD responded:  0430

## 2012-06-20 NOTE — Progress Notes (Signed)
INITIAL ADULT NUTRITION ASSESSMENT Date: 06/20/2012   Time: 11:36 AM  Reason for Assessment: VDRF  INTERVENTION:  If unable to extubate patient in the next 24 hours recommend starting TF via OG or NG tube with Nepro at 20 ml/h, increase by 10 ml every 4 hours to goal rate of 40 ml/h with Prostat 30 ml once daily to provide 1828 kcals, 93 gm protein, 698 ml free water daily.  DOCUMENTATION CODES Per approved criteria  -Not Applicable   ASSESSMENT: Female 41 y.o.  Dx: DKA (diabetic ketoacidosis)  Hx:  Past Medical History  Diagnosis Date  . DVT (deep venous thrombosis)     DVT HISTORY  . Diabetes mellitus   . Migraine     HISTORY  . Anxiety and depression   . CAD (coronary artery disease)     Last cath 2009:  LAD stent 80% stenosis. Circumflex 60-70% stenosis AV groove, right coronary artery 50-60% stenosis. She did have cutting balloon angioplasty of the LAD lesion into a diagonal. However, there was restenosis of this and it was managed medically.  . Bipolar 1 disorder   . Hyperlipidemia   . Hyperparathyroidism   . PE (pulmonary embolism)     HISTORY  . Dialysis patient   . Anemia   . Renal failure     dialysis [place] t,th sat  . Nephrotic syndrome   . Peripheral vascular disease   . MRSA infection   . Anxiety   . Depression   . Gastroparesis   . Complication of anesthesia   . PONV (postoperative nausea and vomiting)     x3 days post anesth. on 12/19/2011  . Myocardial infarction     x3 , last one - 10 yrs. ago  . Hypertension     sees Dr. Antoine Poche - [place], last stress test 11/2011, see result in EPIC, saw Dr. Antoine Poche as well at that time    . Shortness of breath   . Recurrent upper respiratory infection (URI)     Bronchitis 11/2011 - saw Dr. Loney Hering, treating currently /w antibiotic   . GERD (gastroesophageal reflux disease)   . Pneumonia     [facility]- 2012  . Noncompliance of patient with renal dialysis   . Cellulitis Oct. 2013  . MRSA (methicillin resistant staph  aureus) culture positive Oct 2013    Past Surgical History  Procedure Date  . Incise and drain abcess     OF THIGHS FROM INSULIN INJECTIONS  . Heart stents   . Portacath placement 2003  . Cataract extraction w/phaco 11/09/2011    Procedure: CATARACT EXTRACTION PHACO AND INTRAOCULAR LENS PLACEMENT (IOC);  Surgeon: Edmon Crape, MD;  Location: [facility] OR;  Service: Ophthalmology;  Laterality: Right;  . Pars plana vitrectomy 11/09/2011    Procedure: PARS PLANA VITRECTOMY WITH 23 GAUGE;  Surgeon: Edmon Crape, MD;  Location: [facility] OR;  Service: Ophthalmology;  Laterality: Right;  Ahmed Valve; Scleral Reinforcement Graft  . Dg av dialysis  shunt access exist*l* or 12/13/11    left arm  . Dialysis cath inserted & portacath      dialysis catheter inserted & portacath d/c'd- 12/19/2011  . Back surgery X 4    x4 times   . Cholecystectomy   . Eye surgery   . Cardiac catheterization   . Aorta - bilateral femoral artery bypass graft 12/28/2011    Procedure: AORTA BIFEMORAL BYPASS GRAFT;  Surgeon: Nada Libman, MD;  Location: [facility] OR;  Service: Vascular;  Laterality: N/A;  AortoBifemoral  Bypass using hemasheild 14x7 graft  . Femoral-popliteal bypass graft 12/28/2011    Procedure: BYPASS GRAFT FEMORAL-POPLITEAL ARTERY;  Surgeon: Nada Libman, MD;  Location: [facility] OR;  Service: Vascular;  Laterality: Right;  right femoral popliteal bypass using 6x80 propaten graft  . Insertion of dialysis catheter 02/15/2012    Procedure: INSERTION OF DIALYSIS CATHETER;  Surgeon: Chuck Hint, MD;  [facility] OR;  Service: Vascular;  Laterality: Right;  insertion of dialysis catheter right internal jugular vein,removal of left internal jugular vein dialysis catheter  . Insertion of dialysis catheter 02/21/2012    Procedure: INSERTION OF DIALYSIS CATHETER;  Surgeon: Chuck Hint, MD;  Location: [facility] OR;  Service: Cardiovascular;  Laterality: N/A;  Insertion new dialysis catheter; possible venoplasty of fibrin sheath  of  Right IJ catheter  . Venogram 02/21/2012    Procedure: VENOGRAM;  Surgeon: Chuck Hint, MD;  Location: [facility] OR;  Service: Cardiovascular;  Laterality: N/A;  possible venoplasty  . Insertion of dialysis catheter 04/23/2012    Procedure: INSERTION OF DIALYSIS CATHETER;  Surgeon: Sherren Kerns, MD;  Location: [facility] OR;  Service: Vascular;  Laterality: Right;  using a 63fr. x 23 cannon catheter in Right Internal Jugular    Related Meds:  Scheduled Meds:   . alteplase  2 mg Intracatheter Once  . antiseptic oral rinse  15 mL Mouth Rinse QID  . bacitracin-polymyxin b   Both Eyes QHS  . chlorhexidine  15 mL Mouth/Throat BID  . darbepoetin (ARANESP) injection - DIALYSIS  150 mcg Intravenous Q Thu-HD  . ferric gluconate (FERRLECIT/NULECIT) IV  125 mg Intravenous Weekly  . midazolam  2-4 mg Intravenous Once  . mupirocin cream   Topical Daily  . ofloxacin  1 drop Both Eyes TID  . pantoprazole (PROTONIX) IV  80 mg Intravenous Once  . piperacillin-tazobactam  3.375 g Intravenous Q6H  . sodium chloride  3 mL Intravenous Q12H  . vancomycin  1,000 mg Intravenous Q24H  . DISCONTD: amoxicillin-clavulanate  500 mg Oral QHS  . DISCONTD: ARIPiprazole  10 mg Oral Daily  . DISCONTD: cinacalcet  30 mg Oral Q supper  . DISCONTD: clopidogrel  75 mg Oral Daily  . DISCONTD: docusate sodium  100 mg Oral BID  . DISCONTD: fluticasone  1 spray Each Nare Daily  . DISCONTD: heparin  5,000 Units Subcutaneous Q8H  . DISCONTD: insulin aspart  0-20 Units Subcutaneous TID WC  . DISCONTD: insulin aspart  10 Units Subcutaneous Once  . DISCONTD: insulin glargine  33 Units Subcutaneous QHS  . DISCONTD: metoCLOPramide  10 mg Oral QID  . DISCONTD: multivitamin  1 tablet Oral Daily  . DISCONTD: nicotine  42 mg Transdermal Daily  . DISCONTD: PARoxetine  80 mg Oral BH-q7a  . DISCONTD: pyridoxine  100 mg Oral Daily  . DISCONTD: sevelamer  1,600 mg Oral BID BM  . DISCONTD: sevelamer  4,000 mg Oral TID WC  . DISCONTD:  vancomycin  1,000 mg Intravenous Once in dialysis   Continuous Infusions:   . sodium chloride 20 mL/hr at 06/20/12 0500  . calcium gluconate infusion for CRRT 20 g (06/19/12 2100)  . insulin (NOVOLIN-R) infusion 2.8 Units/hr (06/20/12 1100)  . norepinephrine (LEVOPHED) Adult infusion 6 mcg/min (06/20/12 1000)  . pantoprozole (PROTONIX) infusion 8 mg/hr (06/20/12 0400)  . dialysis replacement fluid (prismasate) 500 mL/hr at 06/20/12 1121  . dialysate (PRISMASATE)    . sodium citrate 2 %/dextrose 2.5% solution 3000 mL 280 mL/hr at 06/20/12 0724  . DISCONTD:  sodium chloride 1,000 mL (06/19/12 1323)  . DISCONTD: dextrose 5 % and 0.45% NaCl    . DISCONTD: norepinephrine (LEVOPHED) Adult infusion Stopped (06/19/12 1318)  . DISCONTD: dialysis replacement fluid (prismasate) 500 mL/hr at 06/19/12 1316  . DISCONTD: dialysis replacement fluid (prismasate) 500 mL/hr at 06/20/12 0545  . DISCONTD: dialysate (PRISMASATE) 1,000 mL/hr at 06/20/12 0700  . DISCONTD:  sodium bicarbonate infusion 1000 mL Stopped (06/19/12 1320)   PRN Meds:.acetaminophen, acetaminophen, fentaNYL, heparin, sodium chloride, DISCONTD: sodium chloride, DISCONTD: sodium chloride, DISCONTD: albuterol, DISCONTD: ALPRAZolam, DISCONTD: alum & mag hydroxide-simeth, DISCONTD: dextrose, DISCONTD: dextrose, DISCONTD: feeding supplement (NEPRO CARB STEADY), DISCONTD: heparin, DISCONTD: hydrALAZINE, DISCONTD: lidocaine, DISCONTD: lidocaine-prilocaine, DISCONTD: methocarbamol, DISCONTD: oxyCODONE-acetaminophen DISCONTD: pentafluoroprop-tetrafluoroeth, DISCONTD: potassium chloride, DISCONTD: promethazine   Ht: 5\' 10"  (1.778 m) per patient  Wt: 199 lb 11.8 oz (90.6 kg)  Ideal Wt: 68.2 kg % Ideal Wt: 133%  Wt Readings from Last 10 Encounters:  06/20/12 199 lb 11.8 oz (90.6 kg)  06/09/12 191 lb (86.637 kg)  06/08/12 191 lb 5.8 oz (86.8 kg)  06/03/12 240 lb 15.4 oz (109.3 kg)  05/26/12 253 lb (114.76 kg)  05/19/12 189 lb (85.73 kg)    05/04/12 189 lb 6 oz (85.9 kg)  05/04/12 189 lb 6 oz (85.9 kg)  05/04/12 189 lb 6 oz (85.9 kg)  03/17/12 183 lb (83.008 kg)   Usual Wt: 189 lb % Usual Wt: 105%  BMI=27 (using usual weight)  Food/Nutrition Related Hx:  No nutrition problems identified on admission nutrition screen.  Labs:  CMP     Component Value Date/Time   NA 139 06/20/2012 0541   K 3.6 06/20/2012 0541   CL 96 06/20/2012 0345   CO2 23 06/20/2012 0345   GLUCOSE 133* 06/20/2012 0345   BUN 108* 06/20/2012 0345   CREATININE 4.28* 06/20/2012 0345   CREATININE 1.78* 10/17/2008 2000   CALCIUM 9.2 06/20/2012 0345   CALCIUM 8.3* 08/21/2011 0533   PROT 7.3 06/18/2012 0200   ALBUMIN 1.9* 06/20/2012 0345   AST 40* 06/18/2012 0200   ALT 18 06/18/2012 0200   ALKPHOS 211* 06/18/2012 0200   BILITOT 0.1* 06/18/2012 0200   GFRNONAA 12* 06/20/2012 0345   GFRAA 14* 06/20/2012 0345    CBG (last 3)   Basename 06/20/12 0509 06/20/12 0355 06/20/12 0300  GLUCAP 118* 115* 102*    Sodium  Date/Time Value Range Status  06/20/2012  5:41 AM 139  135 - 145 mEq/L Final  06/20/2012  5:35 AM 139  135 - 145 mEq/L Final  06/20/2012  3:45 AM 139  135 - 145 mEq/L Final  06/20/2012  3:45 AM 139  135 - 145 mEq/L Final    Potassium  Date/Time Value Range Status  06/20/2012  5:41 AM 3.6  3.5 - 5.1 mEq/L Final  06/20/2012  5:35 AM <2.0* 3.5 - 5.1 mEq/L Final  06/20/2012  3:45 AM 3.9  3.5 - 5.1 mEq/L Final  06/20/2012  3:45 AM 3.9  3.5 - 5.1 mEq/L Final    Phosphorus  Date/Time Value Range Status  06/20/2012  3:45 AM 6.0* 2.3 - 4.6 mg/dL Final  40/98/1191 47:82 PM 7.6* 2.3 - 4.6 mg/dL Final  95/62/1308  6:57 PM 9.9* 2.3 - 4.6 mg/dL Final    Magnesium  Date/Time Value Range Status  06/20/2012  3:45 AM 2.6* 1.5 - 2.5 mg/dL Final  84/69/6295  2:84 AM 3.8* 1.5 - 2.5 mg/dL Final  1/32/4401  0:27 AM 2.7* 1.5 - 2.5 mg/dL Final  Intake/Output Summary (Last 24 hours) at 06/20/12 1145 Last data filed at 06/20/12 1100  Gross  per 24 hour  Intake 3467.42 ml  Output   2394 ml  Net 1073.42 ml    Diet Order: NPO   IVF:    sodium chloride Last Rate: 20 mL/hr at 06/20/12 0500  calcium gluconate infusion for CRRT Last Rate: 20 g (06/19/12 2100)  insulin (NOVOLIN-R) infusion Last Rate: 2.8 Units/hr (06/20/12 1100)  norepinephrine (LEVOPHED) Adult infusion Last Rate: 6 mcg/min (06/20/12 1000)  pantoprozole (PROTONIX) infusion Last Rate: 8 mg/hr (06/20/12 0400)  dialysis replacement fluid (prismasate) Last Rate: 500 mL/hr at 06/20/12 1121  dialysate (PRISMASATE)   sodium citrate 2 %/dextrose 2.5% solution 3000 mL Last Rate: 280 mL/hr at 06/20/12 6213  DISCONTD: sodium chloride Last Rate: 1,000 mL (06/19/12 1323)  DISCONTD: dextrose 5 % and 0.45% NaCl   DISCONTD: norepinephrine (LEVOPHED) Adult infusion Last Rate: Stopped (06/19/12 1318)  DISCONTD: dialysis replacement fluid (prismasate) Last Rate: 500 mL/hr at 06/19/12 1316  DISCONTD: dialysis replacement fluid (prismasate) Last Rate: 500 mL/hr at 06/20/12 0545  DISCONTD: dialysate (PRISMASATE) Last Rate: 1,000 mL/hr at 06/20/12 0700  DISCONTD:  sodium bicarbonate infusion 1000 mL Last Rate: Stopped (06/19/12 1320)    Estimated Nutritional Needs:   Kcal: 1775 Protein: 85-100 gm Fluid: 1.2 liters  Weight seems to be fluctuating over the past few months, likely related to fluid overload with renal failure.  Patient usually receives HD three times weekly.  Is currently receiving CRRT.  OG tube is in place; RN reports output of approximately 100 ml per hour.    Patient is currently intubated on ventilator support.  MV: 8.3 Temp:Temp (24hrs), Avg:97.7 F (36.5 C), Min:96.8 F (36 C), Max:98.6 F (37 C)   NUTRITION DIAGNOSIS: Inadequate oral intake r/t inability to eat AEB NPO status.  MONITORING/EVALUATION(Goals): Goal:  Intake to meet >90% of estimated nutrition needs. Monitor:  TF initiation/tolerance/adequacy, weight trend, labs, I/O, vent  status.  EDUCATION NEEDS: -Education not appropriate at this time   Joaquin Courts, RD, LDN, CNSC Pager# [phone] After Hours Pager# [phone]  06/20/2012, 11:36 AM

## 2012-06-20 NOTE — Procedures (Signed)
Staff note  procedure personally supervised

## 2012-06-21 ENCOUNTER — Encounter (HOSPITAL_COMMUNITY)
Admission: EM | Disposition: A | Discharge status: Home Health | Payer: Self-pay | Source: Home / Self Care | Attending: Family Medicine

## 2012-06-21 ENCOUNTER — Encounter (HOSPITAL_COMMUNITY): Payer: Self-pay | Admitting: *Deleted

## 2012-06-21 ENCOUNTER — Inpatient Hospital Stay (HOSPITAL_COMMUNITY): Payer: Medicare Other

## 2012-06-21 HISTORY — PX: ESOPHAGOGASTRODUODENOSCOPY: SHX5428

## 2012-06-21 LAB — POCT I-STAT EG7
Acid-Base Excess: 6 mmol/L — ABNORMAL HIGH (ref 0.0–2.0)
Acid-Base Excess: 10 mmol/L — ABNORMAL HIGH (ref 0.0–2.0)
Acid-Base Excess: 12 mmol/L — ABNORMAL HIGH (ref 0.0–2.0)
Acid-Base Excess: 12 mmol/L — ABNORMAL HIGH (ref 0.0–2.0)
Acid-Base Excess: 8 mmol/L — ABNORMAL HIGH (ref 0.0–2.0)
Bicarbonate: 28.7 mEq/L — ABNORMAL HIGH (ref 20.0–24.0)
Bicarbonate: 28.8 mEq/L — ABNORMAL HIGH (ref 20.0–24.0)
Bicarbonate: 30.3 mEq/L — ABNORMAL HIGH (ref 20.0–24.0)
Bicarbonate: 31.1 mEq/L — ABNORMAL HIGH (ref 20.0–24.0)
Bicarbonate: 31.6 mEq/L — ABNORMAL HIGH (ref 20.0–24.0)
Bicarbonate: 33.5 mEq/L — ABNORMAL HIGH (ref 20.0–24.0)
Bicarbonate: 35.9 mEq/L — ABNORMAL HIGH (ref 20.0–24.0)
Bicarbonate: 37.5 mEq/L — ABNORMAL HIGH (ref 20.0–24.0)
Bicarbonate: 40.2 mEq/L — ABNORMAL HIGH (ref 20.0–24.0)
Calcium, Ion: 0.52 mmol/L — CL (ref 1.12–1.23)
Calcium, Ion: 0.57 mmol/L — CL (ref 1.12–1.23)
Calcium, Ion: 0.64 mmol/L — CL (ref 1.12–1.23)
Calcium, Ion: 0.91 mmol/L — ABNORMAL LOW (ref 1.12–1.23)
HCT: 22 % — ABNORMAL LOW (ref 36.0–46.0)
HCT: 26 % — ABNORMAL LOW (ref 36.0–46.0)
HCT: 26 % — ABNORMAL LOW (ref 36.0–46.0)
HCT: 25 % — ABNORMAL LOW (ref 36.0–46.0)
HCT: 25 % — ABNORMAL LOW (ref 36.0–46.0)
HCT: 25 % — ABNORMAL LOW (ref 36.0–46.0)
HCT: 26 % — ABNORMAL LOW (ref 36.0–46.0)
HCT: 29 % — ABNORMAL LOW (ref 36.0–46.0)
Hemoglobin: 7.5 g/dL — ABNORMAL LOW (ref 12.0–15.0)
Hemoglobin: 8.8 g/dL — ABNORMAL LOW (ref 12.0–15.0)
Hemoglobin: 8.2 g/dL — ABNORMAL LOW (ref 12.0–15.0)
Hemoglobin: 8.8 g/dL — ABNORMAL LOW (ref 12.0–15.0)
Hemoglobin: 8.5 g/dL — ABNORMAL LOW (ref 12.0–15.0)
Hemoglobin: 8.5 g/dL — ABNORMAL LOW (ref 12.0–15.0)
Hemoglobin: 9.9 g/dL — ABNORMAL LOW (ref 12.0–15.0)
O2 Saturation: 41 %
O2 Saturation: 59 %
O2 Saturation: 52 %
Patient temperature: 96.9
Patient temperature: 97
Patient temperature: 97.6
Patient temperature: 97.3
Patient temperature: 97.6
Patient temperature: 98.7
Potassium: 4 mEq/L (ref 3.5–5.1)
Potassium: 3.5 mEq/L (ref 3.5–5.1)
Potassium: 3.4 mEq/L — ABNORMAL LOW (ref 3.5–5.1)
Potassium: 3.3 mEq/L — ABNORMAL LOW (ref 3.5–5.1)
Potassium: 3.2 mEq/L — ABNORMAL LOW (ref 3.5–5.1)
Sodium: 139 mEq/L (ref 135–145)
Sodium: 142 mEq/L (ref 135–145)
Sodium: 142 mEq/L (ref 135–145)
Sodium: 142 mEq/L (ref 135–145)
Sodium: 143 mEq/L (ref 135–145)
Sodium: 143 mEq/L (ref 135–145)
TCO2: 30 mmol/L (ref 0–100)
TCO2: 30 mmol/L (ref 0–100)
TCO2: 33 mmol/L (ref 0–100)
TCO2: 32 mmol/L (ref 0–100)
TCO2: 36 mmol/L (ref 0–100)
TCO2: 38 mmol/L (ref 0–100)
TCO2: 37 mmol/L (ref 0–100)
TCO2: 39 mmol/L (ref 0–100)
TCO2: 42 mmol/L (ref 0–100)
pCO2, Ven: 40.6 mmHg — ABNORMAL LOW (ref 45.0–50.0)
pCO2, Ven: 39.9 mmHg — ABNORMAL LOW (ref 45.0–50.0)
pCO2, Ven: 39.7 mmHg — ABNORMAL LOW (ref 45.0–50.0)
pCO2, Ven: 39.4 mmHg — ABNORMAL LOW (ref 45.0–50.0)
pCO2, Ven: 35.6 mmHg — ABNORMAL LOW (ref 45.0–50.0)
pCO2, Ven: 45.7 mmHg (ref 45.0–50.0)
pCO2, Ven: 43.2 mmHg — ABNORMAL LOW (ref 45.0–50.0)
pH, Ven: 7.461 — ABNORMAL HIGH (ref 7.250–7.300)
pH, Ven: 7.475 — ABNORMAL HIGH (ref 7.250–7.300)
pH, Ven: 7.502 — ABNORMAL HIGH (ref 7.250–7.300)
pH, Ven: 7.507 — ABNORMAL HIGH (ref 7.250–7.300)
pH, Ven: 7.518 — ABNORMAL HIGH (ref 7.250–7.300)
pH, Ven: 7.52 — ABNORMAL HIGH (ref 7.250–7.300)
pH, Ven: 7.599 — ABNORMAL HIGH (ref 7.250–7.300)
pO2, Ven: 23 mmHg — CL (ref 30.0–45.0)
pO2, Ven: 25 mmHg — CL (ref 30.0–45.0)
pO2, Ven: 23 mmHg — CL (ref 30.0–45.0)
pO2, Ven: 24 mmHg — CL (ref 30.0–45.0)
pO2, Ven: 27 mmHg — CL (ref 30.0–45.0)
pO2, Ven: 30 mmHg (ref 30.0–45.0)

## 2012-06-21 LAB — GLUCOSE, CAPILLARY
Glucose-Capillary: 123 mg/dL — ABNORMAL HIGH (ref 70–99)
Glucose-Capillary: 156 mg/dL — ABNORMAL HIGH (ref 70–99)
Glucose-Capillary: 171 mg/dL — ABNORMAL HIGH (ref 70–99)
Glucose-Capillary: 260 mg/dL — ABNORMAL HIGH (ref 70–99)
Glucose-Capillary: 150 mg/dL — ABNORMAL HIGH (ref 70–99)
Glucose-Capillary: 188 mg/dL — ABNORMAL HIGH (ref 70–99)
Glucose-Capillary: 123 mg/dL — ABNORMAL HIGH (ref 70–99)
Glucose-Capillary: 133 mg/dL — ABNORMAL HIGH (ref 70–99)
Glucose-Capillary: 143 mg/dL — ABNORMAL HIGH (ref 70–99)

## 2012-06-21 LAB — POCT I-STAT 7, (LYTES, BLD GAS, ICA,H+H)
Acid-Base Excess: 4 mmol/L — ABNORMAL HIGH (ref 0.0–2.0)
Acid-Base Excess: 6 mmol/L — ABNORMAL HIGH (ref 0.0–2.0)
Acid-Base Excess: 10 mmol/L — ABNORMAL HIGH (ref 0.0–2.0)
Acid-Base Excess: 9 mmol/L — ABNORMAL HIGH (ref 0.0–2.0)
Acid-Base Excess: 16 mmol/L — ABNORMAL HIGH (ref 0.0–2.0)
Acid-Base Excess: 15 mmol/L — ABNORMAL HIGH (ref 0.0–2.0)
Bicarbonate: 31.4 mEq/L — ABNORMAL HIGH (ref 20.0–24.0)
Bicarbonate: 32.9 mEq/L — ABNORMAL HIGH (ref 20.0–24.0)
Bicarbonate: 38.9 mEq/L — ABNORMAL HIGH (ref 20.0–24.0)
Bicarbonate: 39 mEq/L — ABNORMAL HIGH (ref 20.0–24.0)
Calcium, Ion: 1.09 mmol/L — ABNORMAL LOW (ref 1.12–1.23)
Calcium, Ion: 1.05 mmol/L — ABNORMAL LOW (ref 1.12–1.23)
Calcium, Ion: 0.98 mmol/L — ABNORMAL LOW (ref 1.12–1.23)
Calcium, Ion: 0.96 mmol/L — ABNORMAL LOW (ref 1.12–1.23)
Calcium, Ion: 0.93 mmol/L — ABNORMAL LOW (ref 1.12–1.23)
Calcium, Ion: 0.9 mmol/L — ABNORMAL LOW (ref 1.12–1.23)
Calcium, Ion: 0.93 mmol/L — ABNORMAL LOW (ref 1.12–1.23)
Calcium, Ion: 0.99 mmol/L — ABNORMAL LOW (ref 1.12–1.23)
HCT: 18 % — ABNORMAL LOW (ref 36.0–46.0)
HCT: 18 % — ABNORMAL LOW (ref 36.0–46.0)
HCT: 22 % — ABNORMAL LOW (ref 36.0–46.0)
HCT: 21 % — ABNORMAL LOW (ref 36.0–46.0)
Hemoglobin: 7.8 g/dL — ABNORMAL LOW (ref 12.0–15.0)
Hemoglobin: 7.5 g/dL — ABNORMAL LOW (ref 12.0–15.0)
Hemoglobin: 7.5 g/dL — ABNORMAL LOW (ref 12.0–15.0)
Hemoglobin: 7.1 g/dL — ABNORMAL LOW (ref 12.0–15.0)
O2 Saturation: 97 %
O2 Saturation: 97 %
O2 Saturation: 97 %
O2 Saturation: 95 %
O2 Saturation: 96 %
O2 Saturation: 97 %
O2 Saturation: 55 %
Patient temperature: 97
Patient temperature: 97.6
Patient temperature: 97.3
Patient temperature: 96.6
Patient temperature: 98.4
Patient temperature: 98.6
Patient temperature: 97.6
Patient temperature: 98.7
Potassium: 4 mEq/L (ref 3.5–5.1)
Potassium: 3.8 mEq/L (ref 3.5–5.1)
Potassium: 3.7 mEq/L (ref 3.5–5.1)
Potassium: 3.7 mEq/L (ref 3.5–5.1)
Potassium: 3.3 mEq/L — ABNORMAL LOW (ref 3.5–5.1)
Potassium: 3.3 mEq/L — ABNORMAL LOW (ref 3.5–5.1)
Potassium: 3.2 mEq/L — ABNORMAL LOW (ref 3.5–5.1)
Potassium: 3 mEq/L — ABNORMAL LOW (ref 3.5–5.1)
Potassium: 2.9 mEq/L — ABNORMAL LOW (ref 3.5–5.1)
Potassium: 3 mEq/L — ABNORMAL LOW (ref 3.5–5.1)
Sodium: 137 mEq/L (ref 135–145)
Sodium: 142 mEq/L (ref 135–145)
Sodium: 142 mEq/L (ref 135–145)
Sodium: 143 mEq/L (ref 135–145)
Sodium: 142 mEq/L (ref 135–145)
TCO2: 31 mmol/L (ref 0–100)
TCO2: 33 mmol/L (ref 0–100)
TCO2: 34 mmol/L (ref 0–100)
TCO2: 33 mmol/L (ref 0–100)
TCO2: 34 mmol/L (ref 0–100)
TCO2: 39 mmol/L (ref 0–100)
TCO2: 41 mmol/L (ref 0–100)
pCO2 arterial: 36.8 mmHg (ref 35.0–45.0)
pCO2 arterial: 32.8 mmHg — ABNORMAL LOW (ref 35.0–45.0)
pCO2 arterial: 34.8 mmHg — ABNORMAL LOW (ref 35.0–45.0)
pCO2 arterial: 37.7 mmHg (ref 35.0–45.0)
pCO2 arterial: 39 mmHg (ref 35.0–45.0)
pCO2 arterial: 43 mmHg (ref 35.0–45.0)
pCO2 arterial: 40 mmHg (ref 35.0–45.0)
pCO2 arterial: 38.6 mmHg (ref 35.0–45.0)
pH, Arterial: 7.536 — ABNORMAL HIGH (ref 7.350–7.450)
pH, Arterial: 7.581 — ABNORMAL HIGH (ref 7.350–7.450)
pH, Arterial: 7.596 — ABNORMAL HIGH (ref 7.350–7.450)
pH, Arterial: 7.609 (ref 7.350–7.450)
pO2, Arterial: 80 mmHg (ref 80.0–100.0)
pO2, Arterial: 60 mmHg — ABNORMAL LOW (ref 80.0–100.0)
pO2, Arterial: 81 mmHg (ref 80.0–100.0)
pO2, Arterial: 25 mmHg — CL (ref 80.0–100.0)

## 2012-06-21 LAB — CBC
HCT: 20.9 % — ABNORMAL LOW (ref 36.0–46.0)
HCT: 21.2 % — ABNORMAL LOW (ref 36.0–46.0)
Hemoglobin: 7.3 g/dL — ABNORMAL LOW (ref 12.0–15.0)
Hemoglobin: 7 g/dL — ABNORMAL LOW (ref 12.0–15.0)
MCH: 30.7 pg (ref 26.0–34.0)
MCH: 30 pg (ref 26.0–34.0)
MCHC: 33 g/dL (ref 30.0–36.0)
MCV: 88.2 fL (ref 78.0–100.0)
MCV: 89.7 fL (ref 78.0–100.0)
MCV: 91 fL (ref 78.0–100.0)
Platelets: 195 10*3/uL (ref 150–400)
RBC: 2.38 MIL/uL — ABNORMAL LOW (ref 3.87–5.11)
RBC: 2.33 MIL/uL — ABNORMAL LOW (ref 3.87–5.11)
RDW: 18.5 % — ABNORMAL HIGH (ref 11.5–15.5)

## 2012-06-21 LAB — RENAL FUNCTION PANEL
Albumin: 1.7 g/dL — ABNORMAL LOW (ref 3.5–5.2)
BUN: 46 mg/dL — ABNORMAL HIGH (ref 6–23)
BUN: 35 mg/dL — ABNORMAL HIGH (ref 6–23)
CO2: 35 mEq/L — ABNORMAL HIGH (ref 19–32)
Calcium: 8.6 mg/dL (ref 8.4–10.5)
Chloride: 95 mEq/L — ABNORMAL LOW (ref 96–112)
Chloride: 96 mEq/L (ref 96–112)
Creatinine, Ser: 2.28 mg/dL — ABNORMAL HIGH (ref 0.50–1.10)
Creatinine, Ser: 2.04 mg/dL — ABNORMAL HIGH (ref 0.50–1.10)
GFR calc Af Amer: 22 mL/min — ABNORMAL LOW (ref >90)
GFR calc non Af Amer: 19 mL/min — ABNORMAL LOW (ref >90)
Phosphorus: 5.2 mg/dL — ABNORMAL HIGH (ref 2.3–4.6)
Phosphorus: 3.2 mg/dL (ref 2.3–4.6)
Sodium: 137 mEq/L (ref 135–145)

## 2012-06-21 LAB — BASIC METABOLIC PANEL
BUN: 49 mg/dL — ABNORMAL HIGH (ref 6–23)
Chloride: 96 mEq/L (ref 96–112)
GFR calc non Af Amer: 24 mL/min — ABNORMAL LOW (ref >90)
Glucose, Bld: 161 mg/dL — ABNORMAL HIGH (ref 70–99)
Potassium: 3.3 mEq/L — ABNORMAL LOW (ref 3.5–5.1)

## 2012-06-21 SURGERY — EGD (ESOPHAGOGASTRODUODENOSCOPY)
Anesthesia: Moderate Sedation

## 2012-06-21 MED ORDER — MIDAZOLAM HCL 5 MG/ML IJ SOLN
INTRAMUSCULAR | Status: AC
  Filled 2012-06-21: qty 2

## 2012-06-21 MED ORDER — PRISMASOL B22GK 4/0 22-4 MEQ/L IV SOLN
INTRAVENOUS | Status: DC
  Administered 2012-06-21 (×3): via INTRAVENOUS_CENTRAL
  Filled 2012-06-21 (×7): qty 5000

## 2012-06-21 MED ORDER — MIDAZOLAM HCL 10 MG/2ML IJ SOLN
INTRAMUSCULAR | Status: DC | PRN
  Administered 2012-06-21: 2.5 mg via INTRAVENOUS

## 2012-06-21 MED ORDER — FENTANYL CITRATE 0.05 MG/ML IJ SOLN
INTRAMUSCULAR | Status: AC
  Filled 2012-06-21: qty 2

## 2012-06-21 MED ORDER — PRISMASOL BGK 4/2.5 32-4-2.5 MEQ/L IV SOLN
INTRAVENOUS | Status: DC
  Administered 2012-06-21: 11:00:00 via INTRAVENOUS_CENTRAL
  Filled 2012-06-21 (×4): qty 5000

## 2012-06-21 MED ORDER — SODIUM CHLORIDE 0.9 % IV SOLN
INTRAVENOUS | Status: DC
  Administered 2012-06-21 (×2): via INTRAVENOUS

## 2012-06-21 MED ORDER — FENTANYL CITRATE 0.05 MG/ML IJ SOLN
INTRAMUSCULAR | Status: DC | PRN
  Administered 2012-06-21: 25 ug via INTRAVENOUS

## 2012-06-21 MED ORDER — SODIUM CHLORIDE 0.9 % IV SOLN
INTRAVENOUS | Status: DC
  Administered 2012-06-21: 5.5 [IU]/h via INTRAVENOUS
  Filled 2012-06-21: qty 1

## 2012-06-21 NOTE — Progress Notes (Addendum)
[facility]  Subjective:  Intubated, sedated, off pressors Poorly functioning R IJ tunneled cath was replaced over wire yesterday in IR and is working better according to RNs On CVVH, -120 cc/hr net  UF, citrate anticoag   Objective: Vital signs in last 24 hours: Blood pressure 85/54, pulse 87, temperature 97.3 F (36.3 C), temperature source Oral, resp. rate 17, height 6\' 1"  (1.854 m), weight 87.8 kg (193 lb 9 oz), last menstrual period 09/03/2008, SpO2 100.00%.    PHYSICAL EXAM General--as above Chest--rhonchi  Heart--no rub  Abd--nontender  Extr--1+ edema    Lab Results:   Lab 06/21/12 0619 06/21/12 0612 06/21/12 0321 06/20/12 2000 06/20/12 1209  NA 140 141 137 -- --  K 3.5 3.5 3.7 -- --  CL -- -- 92* 93* 92*  CO2 -- -- 29 26 27   BUN -- -- 67* 89* 94*  CREATININE -- -- 2.90* 3.72* 3.81*  ALB -- -- -- -- --  GLUCOSE -- -- 313* -- --  CALCIUM -- -- 8.6 9.2 9.7  PHOS -- -- 5.2* 7.0* 6.6*     Basename 06/21/12 0619 06/21/12 0612 06/21/12 0317 06/20/12 2000  WBC -- -- 41.5* 46.7*  HGB 7.8* 8.8* -- --  HCT 23.0* 26.0* -- --  PLT -- -- 195 265   I have reviewed the patient's current medications. Still on Vanco and zosyn  Assessment/Plan: 1. ESRD- CVVH for renal replacement therapy. K lower. On citrate anticoag. Fluids  4K/0 Ca.  - 120 cc/hr UF working OK  2. Anemia--on aranesp and venofer 100/wk. Got 2 U PRBC yesterday-hgb 7.8 this AM. GI has seen.  Melena in flexiseal--EGD planned 3 . DM--glu 313 this AM--SSI + Lantus--CCM to adjust 4. PVD--no new suggestions  5. CAD-- " " "  6. Diarrhea--c. diff NEG 7. Sacral decub-- Santyl ointment and wound care consult. LE cellulitis--on vanco  9. Secondary PTH--phosphorusbetter. Will give P binders once off CVVVH and eating  10. Rectal bleed-- GI has seen--see 2 above 11. VDRF--CXR shows vascular congestion, she has LOTS of excess fluid (EDW 80 kg and she weighs today 87.8 kg). Will try to UF - 120cc/hr if  BP allows.  CVP 22 this AM 12. Met alkalosis--from NG losses and citrate.  Will Rx IV NS 250/hr    LOS: 4 days   Dallen Bunte F 06/21/2012,7:32 AM   .labalb

## 2012-06-21 NOTE — Progress Notes (Signed)
Visited patient today and spoke with patient's nurse who informed me of overnight events.  Patient still intubated, but off sedation.  She still requires Levophed (BP 85/49) MAP 72.  She continues to have dark brown stools, Hgb dropped to 6.8, and required 1 unit PRBC overnight.  GI is following and plans to perform EGD.  Nephrology managing CRT.  We greatly appreciate CCM, GI, and Renal management while patient in ICU.  Will continue to follow with you until she is medically stable to return to [facility] Teaching Service.

## 2012-06-21 NOTE — Progress Notes (Signed)
PT Cancellation Note  Patient Details Name: [name] MRN: [id number] DOB: [birth year]   Cancelled Treatment:    Reason Eval/Treat Not Completed: Patient not medically ready.  Patient intubated.  Will continue to monitor.   Vena Austria 06/21/2012, 7:05 AM [phone]

## 2012-06-21 NOTE — Op Note (Signed)
[facility] [address] [place], [address]   OPERATIVE PROCEDURE REPORT  PATIENT :[name], [name]  MR#: [id number] BIRTHDATE :19-Apr-1971 GENDER: Female ENDOSCOPIST: Dr.  Lorenza Burton, MD ASSISTANT:   Dorisann Frames, technician & Judithann Sauger, RN PROCEDURE DATE: 07-10-2012  PRE-PROCEDURE PREPERATION: Patient fasted for 8 hours prior to the procedure. She is on the ventilator. PRE-PROCEDURE PHYSICAL: Patient has stable vital signs.  Neck is supple.  There is no JVD, thyromegaly or LAD. Chest clear to auscultation.  S1 and S2 regular. PROCEDURE: EGD, diagnostic ASA CLASS: Class V INDICATIONS:  Melena and iron deficiency anemia. MEDICATIONS: Fentanyl 25 mcg  and Versed 2 mg IV TOPICAL ANESTHETIC:  none used.  DESCRIPTION OF PROCEDURE: After the risks benefits and alternatives of the procedure were thoroughly explained, informed consent was obtained from the patient's sister. The Pentax Gastroscope [id number] was introduced through the mouth and advanced to the second portion of the duodenum , without limitations. The instrument was slowly withdrawn as the mucosa was fully examined.    The esophagus and the GEJ were widely patent with Grade 1 distal esopahgitis noted. Patchy erythema was noted consistent with stress gastritis. Mucosal trauma was noted in the antrum and the midbody from a OGT. The rest of the stomach and the proximal small bowel appeared normal. There were no ulcers, erosions, masses or polyps noted. Retroflexed views revealed no abnormalities. The scope was then withdrawn from the patient and the procedure terminated. There was no evidence of fresh or old heme not in the upper GI tract upto 60 cm. The patient tolerated the procedure without immediate complications.    IMPRESSION: 1) Grade 1 distal esophagitis. 2) Suction injury from OGT. 3) Patchy gastritis-stress gastritis. 4) Normal proximal small  bowel.  RECOMMENDATIONS: 1) Continue PPI's. 2) Serial CBC's.  REPEAT EXAM: None planned for now.  DISCHARGE INSTRUCTIONS: Standard instructions given. _______________________________ eSigned:  Dr. Lorenza Burton, MD July 10, 2012 7:13 PM   CPT CODES: 56213    DIAGNOSIS CODES: 578.1, 280.9n  CC: Coralyn Helling, M.D.  PATIENT NAME:  [name], [name] MR#: [id number]

## 2012-06-21 NOTE — Progress Notes (Signed)
CRITICAL VALUE ALERT  Critical value received:  PH 7.60 via Arterial line (I-stat)  Date of notification:  06/21/12  Time of notification: 2330  Critical value read back:yes  Nurse who received alert: Crist Fat  MD notified (1st page): Dr. Darrick Penna  Time of first page:  2335  MD notified (2nd page):  Time of second page:  Responding MD:  Dr. Darrick Penna  Time MD responded:  2340

## 2012-06-21 NOTE — Progress Notes (Signed)
Name: [name] MRN: [id number] DOB: 07-18-1971    LOS: 4 PCP is Ernestine Conrad, MD Referring: FPTS PULMONARY / CRITICAL CARE MEDICINE  HPI:   41 yo female admitted on 06/17/2012 for HD graft revision.  She was noted to have elevated glucose and anion gap.  She had difficulty with HD 10/23, and then developed cardiac arrest 2nd to hypovolemic shock from GI bleed and hyperkalemia 07-11-23.  Transferred to ICU 11-Jul-2023, and PCCM assumed care.  She had recent tx for leg cellulitis.  Signficant PMHx ESRD on HD, CAD, DM, DVT, gastroparesis, PVD, Bipolar, HTN   Events Since Admission: Patient was on 3300 being treated for hyperglycemia coded the morning of 07-11-23 and sent to ICU 2104. 10/25 Was placed on CRRT and continues on  The Vent, Levophed drip and Insulin drip.  Subjective/ Overnight:  Difficulty transitioning off insulin gtt.  More awake this am.  Remains on CVVH  Vital Signs: Temp:  [96.9 F (36.1 C)-98.7 F (37.1 C)] 97.3 F (36.3 C) (10/26 0330) Pulse Rate:  [68-105] 87  (10/26 0700) Resp:  [0-24] 17  (10/26 0700) BP: (64-119)/(44-88) 85/54 mmHg (10/26 0700) SpO2:  [85 %-100 %] 100 % (10/26 0700) Arterial Line BP: (83-125)/(47-66) 125/58 mmHg (10/26 0700) FiO2 (%):  [40 %] 40 % (10/26 0750) Weight:  [193 lb 9 oz (87.8 kg)] 193 lb 9 oz (87.8 kg) (10/26 0500)  Physical Examination: General:  Chronically ill appearing female, appears greater than stated age  Neuro:  Follows commands, opens eyes on command, nods appropriately to questions. HEENT:   ET tube in place mucous membranes are moist Cardiovascular: s1s2 rrr, distant, NSR  Lungs: resps even non labored on PS 10/5, diminished bases  Abdomen:  Obese soft nontender midline scar noted Musculoskeletal:  Moves all extremities Skin:  Lower extremities with areas of skin breakdown noted. Scarring noted in bilateral groins. Scarring and old incisions noted right lower extremity probable hemodialysis site.  Principal Problem:  *DKA (diabetic ketoacidosis) Active Problems:  Diabetic gastroparesis  ESRD on dialysis  DM (diabetes mellitus), type 2 with complications  Tobacco abuse  Hyperparathyroidism, secondary renal  Bipolar 1 disorder  Diabetic leg ulcer  Atherosclerosis of native arteries of the extremities with ulceration(440.23)  Left leg cellulitis  Hyponatremia  Acute respiratory failure with hypoxia  Shock circulatory  Cardiac arrest  Acidosis  Hyperkalemia  Ir Fluoro Guide Cv Line Right  06/20/2012  *RADIOLOGY REPORT*  Clinical data:  End-stage renal failure.  Poor function of tunneled hemodialysis catheter.  EXCHANGE AND REPOSITIONING OF TUNNELED HEMODIALYSIS CATHETER UNDER FLUOROSCOPY  Technique and findings: The procedure, risks (including but not limited to bleeding, infection, organ damage), benefits, and alternatives were explained to the patient.  Questions regarding the procedure were encouraged and answered.  The patient understands and consents to the procedure.The patient was already receiving adequate antibiotic coverage.  Site was marked, prepped with Betadine, draped in usual sterile fashion, infiltrated locally with 1% lidocaine. Maximal barrier sterile technique was utilized including caps, mask, sterile gowns, sterile gloves, sterile drape, hand hygiene and skin antiseptic.  Under intermittent fluoroscopic guidance, the previously placed tunneled right IJ hemodialysis catheter was exchanged   over a stiff glide wire for a new 23 cm Equistream hemodialysis catheter, positioned with its tip in the proximal right atrium.  Spot chest radiograph confirms appropriate position.  Both lumens flush and aspirate easily.  The catheter was secured externally with O-Prolene suture and flushed per protocol. The patient tolerated the procedure well.  No immediate complication.  IMPRESSION: Technically successful exchange and revision of right IJ tunneled hemodialysis catheter, ready for routine use.   Original  Report Authenticated By: Osa Craver, M.D.    Dg Chest Port 1 View  06/21/2012  *RADIOLOGY REPORT*  Clinical Data: Check endotracheal tube.  PORTABLE CHEST - 1 VIEW  Comparison: 06/20/2012  Findings: Endotracheal tube tip measures about 4.2 cm above the carina.  Enteric tube tip is below the left hemidiaphragm but not visible on the image.  Right central venous catheter with tip over the right atrium.  Borderline heart size with normal pulmonary vascularity.  Probable small pleural effusions bilaterally. Infiltration or atelectasis in the left lung base.  No pneumothorax.  IMPRESSION: Appliances appear unchanged in position.  Small bilateral pleural effusions.  Infiltration or atelectasis in the left lung base.   Original Report Authenticated By: Marlon Pel, M.D.    Portable Chest Xray In Am  06/20/2012  *RADIOLOGY REPORT*  Clinical Data: Endotracheal tube position.  PORTABLE CHEST - 1 VIEW  Comparison: One-view chest 06/19/2012.  Findings: The endotracheal tube terminates 3.6 cm above the carina. An NG tube is in place.  The right IJ dialysis catheter is stable. The heart size is exaggerated by low lung volumes.  Mild pulmonary vascular congestion is stable.  IMPRESSION:  1.  The support apparatus is stable. 2.  Mild pulmonary vascular congestion.   Original Report Authenticated By: Jamesetta Orleans. MATTERN, M.D.    Dg Chest Port 1 View  06/19/2012  *RADIOLOGY REPORT*  Clinical Data: Right internal jugular line placement  PORTABLE CHEST - 1 VIEW  Comparison: Earlier same day; 06/17/2012  Findings:  Grossly unchanged enlarged cardiac silhouette and mediastinal contours.  A linear vertically oriented structure overlies the inferior aspect of the right neck and may represent the distal end of the reported right jugular line placement.  Interval removal of external pacing pads.  Otherwise, stable positioning of support apparatus.  Pulmonary vasculature remains indistinct.  Fluid is again  seen within the minor fissure.  Small bilateral effusions are again suspected. No new focal airspace opacities.  No definite pneumothorax.  Unchanged bones.  IMPRESSION: 1.  Right jugular approach central venous catheter tip is high- riding, overlying the inferior aspect the right lower neck. Advancement is advised. 2.  Otherwise, stable positioning of support apparatus.  No pneumothorax. 3.  Grossly unchanged findings of mild pulmonary edema and small bilateral effusions.   Original Report Authenticated By: Waynard Reeds, M.D.    Dg Chest Port 1 View  06/19/2012  *RADIOLOGY REPORT*  Clinical Data: 41 year old female with respiratory arrest.  Central line placement, query pneumothorax.  PORTABLE CHEST - 1 VIEW  Comparison: 06/18/2012 and earlier.  Findings: Portable semi upright AP view 0920 hours.  Dual lumen right IJ approach central venous catheter in place.  No evidence of right pneumothorax.  Endotracheal tube now in place, tip between level of clavicles and carina.  Resuscitation pad on the left chest.  Improved lung volumes since 06/17/2012.  Probable fluid in the right minor fissure.  Patchy opacity at the left base.  No overt pulmonary edema.  Stable cardiac size and mediastinal contours.  IMPRESSION: 1.  Endotracheal tube tip in good position.  Stable dual lumen right IJ central line with no associated pneumothorax. 2.  Small right pleural effusion suspected.  Patchy nonspecific opacity at the left base.   Original Report Authenticated By: Harley Hallmark, M.D.      ASSESSMENT  AND PLAN  PULMONARY  Lab 06/21/12 0619 06/21/12 0612 06/21/12 0319 06/21/12 0310 06/21/12 0106 06/20/12 2257 06/20/12 2114  PHART 7.594* -- 7.591* -- 7.536* 7.533* 7.511*  PCO2ART 32.8* -- 34.2* -- 36.8 35.0 33.9*  PO2ART 60.0* -- 72.0* -- 76.0* 75.0* 80.0  HCO3 31.9* 31.1* 33.1* 31.6* 31.4* -- --  O2SAT 95.0 51.0 97.0 49.0 97.0 -- --   Ventilator Settings: Vent Mode:  [-] PSV FiO2 (%):  [40 %] 40 % Set Rate:   [16 bmp] 16 bmp Vt Set:  [500 mL] 500 mL PEEP:  [5 cmH20] 5 cmH20 Pressure Support:  [10 cmH20] 10 cmH20 Plateau Pressure:  [21 cmH20] 21 cmH20  ETT: 10/25>>   A:  Respiratory arrest s/p cardiopulmonary arrest.  P:   Cont PS wean as tol - not ready for extubation  F/u CXR  CARDIOVASCULAR  Lab 06/19/12 1330 06/17/12 1633 06/17/12 1148  TROPONINI -- -- --  LATICACIDVEN 4.4* -- 1.3  PROBNP -- 16777.0* --    Lines:  R Subclavian hemodialysis catheter 10/25>>> right IJ short triple lumen 10/25>>>  A:  Cardiac arrest Shock: likely from GI bleed P:  Transfuse as needed for bleeding Wean off pressors to keep SBP > 90, MAP > 65   RENAL  Lab 06/21/12 0619 06/21/12 0612 06/21/12 0321 06/21/12 0319 06/21/12 0317 06/21/12 0310 06/20/12 2000 06/20/12 1209 06/20/12 0345 06/19/12 2215 06/18/12 0200  NA 140 141 137 138 -- 139 -- -- -- -- --  K 3.5 3.5 -- -- -- -- -- -- -- -- --  CL -- -- 92* -- -- -- 93* 92* 96 98 --  CO2 -- -- 29 -- -- -- 26 27 23 19  --  BUN -- -- 67* -- -- -- 89* 94* 108* 123* --  CREATININE -- -- 2.90* -- -- -- 3.72* 3.81* 4.28* 4.80* --  CALCIUM -- -- 8.6 -- -- -- 9.2 9.7 9.2 8.8 --  MG -- -- -- -- 2.2 -- -- -- 2.6* -- 3.8*  PHOS -- -- 5.2* -- -- -- 7.0* 6.6* 6.0* 7.6* --   Intake/Output      10/25 0701 - 10/26 0700 10/26 0701 - 10/27 0700   I.V. (mL/kg) 2221.1 (25.3) 169.4 (1.9)   Blood 327.1    Other     IV Piggyback 350    Total Intake(mL/kg) 2898.2 (33) 169.4 (1.9)   Emesis/NG output 1325 50   Other 3754 228   Stool 3    Total Output 5082 278   Net -2183.8 -108.6        Stool Occurrence 1 x     Foley:  No Foley patient is anuric.  A:   Hyperkalemia Hypervolemia Metabolic acidosis: + anion gap, likely d/t end-organ hypoperfusion P:   Cont CRRT per renal F/u BMET, ABG Cont insulin gtt for now with +anion gap  GASTROINTESTINAL  Lab 06/21/12 0321 06/20/12 2000 06/20/12 1209 06/20/12 0345 06/19/12 2215 06/18/12 0200  AST -- -- -- -- --  40*  ALT -- -- -- -- -- 18  ALKPHOS -- -- -- -- -- 211*  BILITOT -- -- -- -- -- 0.1*  PROT -- -- -- -- -- 7.3  ALBUMIN 1.6* 1.7* 1.7* 1.9* 1.8* --    A:  GI bleed. P:   GI following - EGD at some point  Continue protonix gtt Hemoccult stools  HEMATOLOGIC  Lab 06/21/12 1610 06/21/12 0612 06/21/12 0319 06/21/12 9604 06/21/12 0310 06/20/12 2000 06/20/12 1209 06/20/12 0345 06/19/12 2215  06/19/12 0918  HGB 7.8* 8.8* 7.8* 7.3* 8.2* -- -- -- -- --  HCT 23.0* 26.0* 23.0* 21.0* 24.0* -- -- -- -- --  PLT -- -- -- 195 -- 265 288 397 385 --  INR -- -- -- -- -- -- -- -- -- 2.06*  APTT -- -- -- -- -- -- -- -- -- 42*   A:  Anemia related to GI bleeding P:  Transfuse PRBCs as needed for bleeding Monitor frequent H&H aranesp per renal   INFECTIOUS  Lab 06/21/12 0317 06/20/12 2000 06/20/12 1209 06/20/12 0345 06/19/12 2215  WBC 41.5* 46.7* 40.8* 51.9* 44.1*  PROCALCITON -- -- -- -- --   Cultures: C. difficile negative -10/23 MRSA negative by PCR 10/23  Antibiotics: Augmentin 10/23>>>10/24 Zosyn 10/24>>> Vancomycin during dialysis 10/24>>>  A:  Left lower extremity cellulitis P:   Continue antibiotics Consult pharmacy to dose vancomycin while patient is on CRRT   ENDOCRINE  Lab 06/21/12 0721 06/21/12 0600 06/21/12 0506 06/21/12 0420 06/21/12 0324  GLUCAP 145* 232* 243* 255* 288*   A:  Hyperglycemia  P:   Insulin gtt  When gap closed and ready to transition off gtt will likely need higher dose lantus than protocol - takes >30units at home   NEUROLOGIC  A:  Acute Encephalopathy, possible anoxia  Appears improved.  Following commands.  P:   Status post code PRN pain mgmt    BEST PRACTICE / DISPOSITION Level of Care:  Critical Primary Service:  Critical care services Consultants:  GI Code Status:  Full (per d/w NP Anders Simmonds who d/w family  - full code for now but if HD fails family open to discussions about palliation) Diet:  N.p.o. DVT Px:  None at this time  possible GI bleed, no heparin. Lower extremities wounds no SCDs GI Px:  Protonix Social / Family:  Family updated at bedside 10/26    [facility], NP 06/21/2012  9:05 AM Pager: [phone] or [phone]  *Care during the described time interval was provided by me and/or other providers on the critical care team. I have reviewed this patient's available data, including medical history, events of note, physical examination and test results as part of my evaluation.   Reviewed above, examined pt, and agree with assessment/plan.  Hemodynamics improving.  Mental status better.  Tolerating some pressure support, but not ready for extubation yet.  Still having melanotic stool, but no blood from OG tube.  GI to f/u today and determine if/when she needs to be scoped.  Updated family at bedside.  Critical care time 35 minutes.  Coralyn Helling, MD [facility] Pulmonary/Critical Care 06/21/2012, 11:44 AM Pager:  [phone] After 3pm call: [phone]

## 2012-06-22 ENCOUNTER — Inpatient Hospital Stay (HOSPITAL_COMMUNITY): Payer: Medicare Other

## 2012-06-22 LAB — CBC
HCT: 19.9 % — ABNORMAL LOW (ref 36.0–46.0)
HCT: 28.6 % — ABNORMAL LOW (ref 36.0–46.0)
Hemoglobin: 6.5 g/dL — CL (ref 12.0–15.0)
MCH: 30.5 pg (ref 26.0–34.0)
MCH: 30.1 pg (ref 26.0–34.0)
MCHC: 32.9 g/dL (ref 30.0–36.0)
MCHC: 32.4 g/dL (ref 30.0–36.0)
MCV: 93.4 fL (ref 78.0–100.0)
Platelets: 150 10*3/uL (ref 150–400)
RBC: 2.13 MIL/uL — ABNORMAL LOW (ref 3.87–5.11)
RBC: 3.06 MIL/uL — ABNORMAL LOW (ref 3.87–5.11)
RDW: 19.1 % — ABNORMAL HIGH (ref 11.5–15.5)

## 2012-06-22 LAB — GLUCOSE, CAPILLARY
Glucose-Capillary: 116 mg/dL — ABNORMAL HIGH (ref 70–99)
Glucose-Capillary: 125 mg/dL — ABNORMAL HIGH (ref 70–99)
Glucose-Capillary: 145 mg/dL — ABNORMAL HIGH (ref 70–99)
Glucose-Capillary: 186 mg/dL — ABNORMAL HIGH (ref 70–99)
Glucose-Capillary: 135 mg/dL — ABNORMAL HIGH (ref 70–99)
Glucose-Capillary: 141 mg/dL — ABNORMAL HIGH (ref 70–99)
Glucose-Capillary: 161 mg/dL — ABNORMAL HIGH (ref 70–99)
Glucose-Capillary: 163 mg/dL — ABNORMAL HIGH (ref 70–99)
Glucose-Capillary: 159 mg/dL — ABNORMAL HIGH (ref 70–99)
Glucose-Capillary: 155 mg/dL — ABNORMAL HIGH (ref 70–99)

## 2012-06-22 LAB — RENAL FUNCTION PANEL
BUN: 32 mg/dL — ABNORMAL HIGH (ref 6–23)
BUN: 26 mg/dL — ABNORMAL HIGH (ref 6–23)
CO2: 34 mEq/L — ABNORMAL HIGH (ref 19–32)
CO2: 34 mEq/L — ABNORMAL HIGH (ref 19–32)
Calcium: 8.9 mg/dL (ref 8.4–10.5)
Chloride: 95 mEq/L — ABNORMAL LOW (ref 96–112)
GFR calc Af Amer: 32 mL/min — ABNORMAL LOW (ref >90)
GFR calc Af Amer: 33 mL/min — ABNORMAL LOW (ref >90)
Glucose, Bld: 160 mg/dL — ABNORMAL HIGH (ref 70–99)
Glucose, Bld: 152 mg/dL — ABNORMAL HIGH (ref 70–99)
Phosphorus: 3.7 mg/dL (ref 2.3–4.6)
Potassium: 3.5 mEq/L (ref 3.5–5.1)
Potassium: 3.6 mEq/L (ref 3.5–5.1)
Sodium: 143 mEq/L (ref 135–145)
Sodium: 142 mEq/L (ref 135–145)

## 2012-06-22 LAB — POCT I-STAT 3, ART BLOOD GAS (G3+)
Acid-Base Excess: 11 mmol/L — ABNORMAL HIGH (ref 0.0–2.0)
O2 Saturation: 97 %
Patient temperature: 99.5
TCO2: 40 mmol/L (ref 0–100)
pCO2 arterial: 38.4 mmHg (ref 35.0–45.0)
pH, Arterial: 7.614 (ref 7.350–7.450)
pH, Arterial: 7.508 — ABNORMAL HIGH (ref 7.350–7.450)

## 2012-06-22 LAB — BASIC METABOLIC PANEL
BUN: 31 mg/dL — ABNORMAL HIGH (ref 6–23)
CO2: 32 mEq/L (ref 19–32)
Calcium: 8.6 mg/dL (ref 8.4–10.5)
Chloride: 96 mEq/L (ref 96–112)
Creatinine, Ser: 2.13 mg/dL — ABNORMAL HIGH (ref 0.50–1.10)
Glucose, Bld: 159 mg/dL — ABNORMAL HIGH (ref 70–99)

## 2012-06-22 LAB — PREPARE RBC (CROSSMATCH)

## 2012-06-22 MED ORDER — POTASSIUM CHLORIDE 10 MEQ/50ML IV SOLN
10.0000 meq | INTRAVENOUS | Status: AC
  Administered 2012-06-22 (×2): 10 meq via INTRAVENOUS
  Filled 2012-06-22 (×2): qty 50

## 2012-06-22 MED ORDER — PRISMASOL BGK 4/2.5 32-4-2.5 MEQ/L IV SOLN
INTRAVENOUS | Status: DC
  Administered 2012-06-22 – 2012-06-24 (×5): via INTRAVENOUS_CENTRAL
  Filled 2012-06-22 (×9): qty 5000

## 2012-06-22 MED ORDER — PANTOPRAZOLE SODIUM 40 MG IV SOLR
40.0000 mg | INTRAVENOUS | Status: DC
  Administered 2012-06-22 – 2012-06-26 (×5): 40 mg via INTRAVENOUS
  Filled 2012-06-22 (×6): qty 40

## 2012-06-22 MED ORDER — MIDAZOLAM HCL 2 MG/2ML IJ SOLN
2.0000 mg | Freq: Once | INTRAMUSCULAR | Status: AC
  Administered 2012-06-22: 2 mg via INTRAVENOUS
  Filled 2012-06-22: qty 2

## 2012-06-22 MED ORDER — HEPARIN SODIUM (PORCINE) 1000 UNIT/ML DIALYSIS: 1000.0000 [IU] | INTRAMUSCULAR | Status: DC | PRN

## 2012-06-22 MED ORDER — BIOTENE DRY MOUTH MT LIQD: 15.0000 mL | Freq: Two times a day (BID) | OROMUCOSAL | Status: DC

## 2012-06-22 MED ORDER — PRISMASOL BGK 4/2.5 32-4-2.5 MEQ/L IV SOLN
INTRAVENOUS | Status: DC
  Administered 2012-06-22 – 2012-06-24 (×5): via INTRAVENOUS_CENTRAL
  Filled 2012-06-22 (×9): qty 5000

## 2012-06-22 MED ORDER — INSULIN ASPART 100 UNIT/ML ~~LOC~~ SOLN
1.0000 [IU] | SUBCUTANEOUS | Status: DC
  Administered 2012-06-22 (×3): 2 [IU] via SUBCUTANEOUS
  Administered 2012-06-23 (×3): 1 [IU] via SUBCUTANEOUS
  Administered 2012-06-24: 3 [IU] via SUBCUTANEOUS
  Administered 2012-06-24 (×4): 2 [IU] via SUBCUTANEOUS

## 2012-06-22 MED ORDER — INSULIN ASPART 100 UNIT/ML ~~LOC~~ SOLN: 1.0000 [IU] | SUBCUTANEOUS | Status: DC

## 2012-06-22 MED ORDER — CHLORHEXIDINE GLUCONATE 0.12 % MT SOLN: 15.0000 mL | Freq: Two times a day (BID) | OROMUCOSAL | Status: DC

## 2012-06-22 MED ORDER — PRISMASOL BGK 4/2.5 32-4-2.5 MEQ/L IV SOLN
INTRAVENOUS | Status: DC
  Administered 2012-06-22 – 2012-06-24 (×10): via INTRAVENOUS_CENTRAL
  Filled 2012-06-22 (×18): qty 5000

## 2012-06-22 MED ORDER — PANTOPRAZOLE SODIUM 40 MG PO PACK
40.0000 mg | PACK | Freq: Two times a day (BID) | ORAL | Status: DC
  Filled 2012-06-22 (×2): qty 20

## 2012-06-22 MED ORDER — INSULIN GLARGINE 100 UNIT/ML ~~LOC~~ SOLN
10.0000 [IU] | SUBCUTANEOUS | Status: DC
  Administered 2012-06-22 – 2012-06-24 (×3): 10 [IU] via SUBCUTANEOUS

## 2012-06-22 MED ORDER — SODIUM CHLORIDE 0.9 % IV SOLN
INTRAVENOUS | Status: DC
  Administered 2012-06-22 – 2012-06-24 (×7): via INTRAVENOUS

## 2012-06-22 MED ORDER — SODIUM CHLORIDE 0.9 % FOR CRRT
INTRAVENOUS_CENTRAL | Status: DC | PRN
  Filled 2012-06-22: qty 1000

## 2012-06-22 NOTE — Progress Notes (Signed)
Name: [name] MRN: [id number] DOB: 09-06-1970    LOS: 5 PCP is Ernestine Conrad, MD Referring: FPTS PULMONARY / CRITICAL CARE MEDICINE  HPI:   41 yo female admitted on 06/17/2012 for HD graft revision.  She was noted to have elevated glucose and anion gap.  She had difficulty with HD 10/23, and then developed cardiac arrest 2nd to hypovolemic shock from GI bleed and hyperkalemia 2023/07/13.  Transferred to ICU 13-Jul-2023, and PCCM assumed care.  She had recent tx for leg cellulitis.  Signficant PMHx ESRD on HD, CAD, DM, DVT, gastroparesis, PVD, Bipolar, HTN   Events Since Admission: Patient was on 3300 being treated for hyperglycemia coded the morning of Jul 13, 2023 and sent to ICU 2104. 10/25 Was placed on CRRT and continues on  The Vent, Levophed drip and Insulin drip.  Subjective/ Overnight:  EGD late yesterday showing esophagitis, gastritis and ?suction injury from OGT.   Off pressors. Off CVVH   Vital Signs: Temp:  [97.4 F (36.3 C)-99.8 F (37.7 C)] 99 F (37.2 C) (10/27 0830) Pulse Rate:  [74-98] 78  (10/27 0830) Resp:  [13-25] 16  (10/27 0830) BP: (85-128)/(42-63) 116/55 mmHg (10/27 0700) SpO2:  [99 %-100 %] 100 % (10/27 0700) Arterial Line BP: (104-132)/(45-123) 121/58 mmHg (10/27 0830) FiO2 (%):  [40 %] 40 % (10/27 0719) Weight:  [187 lb 2.7 oz (84.9 kg)] 187 lb 2.7 oz (84.9 kg) (10/27 0300)  Physical Examination: General:  Chronically ill appearing female, appears greater than stated age  Neuro:  Intermittent agitation, sedate this am, not following commands   HEENT:   ETT tube in place mucous membranes are moist Cardiovascular: s1s2 rrr, distant, NSR  Lungs: resps even non labored on vent, few scattered ronchi, diminished bases  Abdomen:  Obese soft nontender midline scar noted Musculoskeletal:  Moves all extremities Skin:  Lower extremities with areas of skin breakdown noted. Scarring noted in bilateral groins. Scarring and old incisions noted right lower extremity probable  hemodialysis site.  Principal Problem:  *DKA (diabetic ketoacidosis) Active Problems:  Diabetic gastroparesis  ESRD on dialysis  DM (diabetes mellitus), type 2 with complications  Tobacco abuse  Hyperparathyroidism, secondary renal  Bipolar 1 disorder  Diabetic leg ulcer  Atherosclerosis of native arteries of the extremities with ulceration(440.23)  Left leg cellulitis  Hyponatremia  Acute respiratory failure with hypoxia  Shock circulatory  Cardiac arrest  Acidosis  Hyperkalemia  Ir Fluoro Guide Cv Line Right  06/20/2012  *RADIOLOGY REPORT*  Clinical data:  End-stage renal failure.  Poor function of tunneled hemodialysis catheter.  EXCHANGE AND REPOSITIONING OF TUNNELED HEMODIALYSIS CATHETER UNDER FLUOROSCOPY  Technique and findings: The procedure, risks (including but not limited to bleeding, infection, organ damage), benefits, and alternatives were explained to the patient.  Questions regarding the procedure were encouraged and answered.  The patient understands and consents to the procedure.The patient was already receiving adequate antibiotic coverage.  Site was marked, prepped with Betadine, draped in usual sterile fashion, infiltrated locally with 1% lidocaine. Maximal barrier sterile technique was utilized including caps, mask, sterile gowns, sterile gloves, sterile drape, hand hygiene and skin antiseptic.  Under intermittent fluoroscopic guidance, the previously placed tunneled right IJ hemodialysis catheter was exchanged   over a stiff glide wire for a new 23 cm Equistream hemodialysis catheter, positioned with its tip in the proximal right atrium.  Spot chest radiograph confirms appropriate position.  Both lumens flush and aspirate easily.  The catheter was secured externally with O-Prolene suture and flushed per protocol.  The patient tolerated the procedure well.  No immediate complication.  IMPRESSION: Technically successful exchange and revision of right IJ tunneled hemodialysis  catheter, ready for routine use.   Original Report Authenticated By: Osa Craver, M.D.    Dg Chest Port 1 View  06/22/2012  *RADIOLOGY REPORT*  Clinical Data: Respiratory failure  PORTABLE CHEST - 1 VIEW  Comparison:   the previous day's study  Findings: The nasogastric tube has been removed.  Endotracheal tube and tunneled right IJ hemodialysis catheter are stable in position. Left retrocardiac consolidation / atelectasis persists.  Probable small left effusion.  Mild bibasilar interstitial edema or infiltrates, slightly more conspicuous than on prior exam. Vascular stent projects in the left axilla.  IMPRESSION:  1.  Slight increase in bibasilar interstitial edema or infiltrates. 2.  Persistent small left effusion and left lower lung consolidation / atelectasis.   Original Report Authenticated By: Osa Craver, M.D.    Dg Chest Port 1 View  06/21/2012  *RADIOLOGY REPORT*  Clinical Data: Check endotracheal tube.  PORTABLE CHEST - 1 VIEW  Comparison: 06/20/2012  Findings: Endotracheal tube tip measures about 4.2 cm above the carina.  Enteric tube tip is below the left hemidiaphragm but not visible on the image.  Right central venous catheter with tip over the right atrium.  Borderline heart size with normal pulmonary vascularity.  Probable small pleural effusions bilaterally. Infiltration or atelectasis in the left lung base.  No pneumothorax.  IMPRESSION: Appliances appear unchanged in position.  Small bilateral pleural effusions.  Infiltration or atelectasis in the left lung base.   Original Report Authenticated By: Marlon Pel, M.D.      ASSESSMENT AND PLAN  PULMONARY  Lab 06/22/12 0415 06/21/12 2320 06/21/12 2314 06/21/12 2054 06/21/12 2044 06/21/12 1644  PHART 7.614* -- 7.609* 7.557* 7.596* 7.572*  PCO2ART 38.4 -- 38.6 43.9 40.0 43.0  PO2ART 77.0* -- 60.0* 25.0* 61.0* 76.0*  HCO3 38.7* 40.2* 38.7* 39.0* 38.9* --  O2SAT 97.0 52.0 94.0 55.0 94.0 --   Ventilator  Settings: Vent Mode:  [-] PRVC FiO2 (%):  [40 %] 40 % Set Rate:  [16 bmp] 16 bmp Vt Set:  [500 mL] 500 mL PEEP:  [5 cmH20] 5 cmH20 Pressure Support:  [10 cmH20] 10 cmH20 Plateau Pressure:  [15 cmH20-24 cmH20] 15 cmH20  ETT: 10/25>>   A:  Respiratory arrest s/p cardiopulmonary arrest.  P:   Cont PS wean as tol - not ready for extubation r/t mental status  F/u CXR  CARDIOVASCULAR  Lab 06/19/12 1330 06/17/12 1633 06/17/12 1148  TROPONINI -- -- --  LATICACIDVEN 4.4* -- 1.3  PROBNP -- 16777.0* --    Lines:  R Subclavian hemodialysis catheter (IR) 10/25>>> right IJ short triple lumen (IR) 10/25>>>  A:  Cardiac arrest Shock: likely from GI bleed P:  Transfuse as needed for bleeding Wean off pressors to keep SBP > 90, MAP > 65 Trend CVP   RENAL  Lab 06/22/12 0556 06/22/12 0406 06/21/12 2320 06/21/12 2314 06/21/12 2054 06/21/12 2045 06/21/12 1239 06/21/12 1050 06/21/12 0321 06/21/12 0317 06/20/12 2000 06/20/12 0345 06/18/12 0200  NA 144143 -- 142 142 143 144 -- -- -- -- -- -- --  K 3.53.5 -- 3.7 -- -- -- -- -- -- -- -- -- --  CL 9695* -- -- -- -- 96 95* 96 92* -- -- -- --  CO2 3234* -- -- -- -- 34* 35* 34* 29 -- -- -- --  BUN 31*32* -- -- -- --  35* 46* 49* 67* -- -- -- --  CREATININE 2.13*2.13* -- -- -- -- 2.04* 2.28* 2.39* 2.90* -- -- -- --  CALCIUM 8.68.7 -- -- -- -- 8.5 8.3* 8.4 8.6 -- -- -- --  MG -- 1.8 -- -- -- -- -- -- -- 2.2 -- 2.6* 3.8*  PHOS 3.7 -- -- -- -- 3.2 3.7 -- 5.2* -- 7.0* -- --   Intake/Output      10/26 0701 - 10/27 0700 10/27 0701 - 10/28 0700   I.V. (mL/kg) 5292.2 (62.3) 50 (0.6)   Blood  350   IV Piggyback 450    Total Intake(mL/kg) 5742.2 (67.6) 400 (4.7)   Emesis/NG output 600    Other 5940    Stool 125    Total Output 6665    Net -922.8 +400         Foley:  No Foley patient is anuric.  A:   Hyperkalemia Hypervolemia Metabolic acidosis: + anion gap, likely d/t end-organ hypoperfusion -- gap closed  Metabolic alkalosis: Likely  from dialysate ESRD P:   Cont CRRT per renal F/u BMET, ABG Insulin gtt off Gap closed  CVVH off overnight r/t alkalosis, to restart this am   GASTROINTESTINAL  Lab 06/22/12 0556 06/21/12 2045 06/21/12 1239 06/21/12 0321 06/20/12 2000 06/18/12 0200  AST -- -- -- -- -- 40*  ALT -- -- -- -- -- 18  ALKPHOS -- -- -- -- -- 211*  BILITOT -- -- -- -- -- 0.1*  PROT -- -- -- -- -- 7.3  ALBUMIN 1.7* 1.7* 1.6* 1.6* 1.7* --    A:  GI bleed. P:   GI following  EGD 10/26 revealed esophagitis, gastritis and possible suction injury from OGT  Transition to PO protonix Hemoccult all stools Serial cbc   HEMATOLOGIC  Lab 06/22/12 0406 06/21/12 2320 06/21/12 2314 06/21/12 2054 06/21/12 2045 06/21/12 1805 06/21/12 1239 06/21/12 0317 06/20/12 2000 06/19/12 0918  HGB 6.5* 8.2* 7.1* 7.8* 7.0* -- -- -- -- --  HCT 19.9* 24.0* 21.0* 23.0* 21.2* -- -- -- -- --  PLT 168 -- -- -- 186 -- 193 195 265 --  INR -- -- -- -- -- 1.47 -- -- -- 2.06*  APTT -- -- -- -- -- 45* -- -- -- 42*   A:  Anemia related to GI bleeding P:  Transfuse as needed for hgb <7 Monitor frequent H&H aranesp per renal   INFECTIOUS  Lab 06/22/12 0406 06/21/12 2045 06/21/12 1239 06/21/12 0317 06/20/12 2000  WBC 30.9* 34.2* 35.8* 41.5* 46.7*  PROCALCITON -- -- -- -- --   Cultures: C. difficile negative -10/23 MRSA negative by PCR 10/23  Antibiotics: Augmentin 10/23>>>10/24 Zosyn 10/24>>> Vancomycin during dialysis 10/24>>>  A:  Left lower extremity cellulitis P:   Continue antibiotics Consult pharmacy to dose vancomycin while patient is on CRRT   ENDOCRINE  Lab 06/22/12 0714 06/22/12 0644 06/22/12 0512 06/22/12 0403 06/22/12 0310  GLUCAP 161* 163* 157* 147* 136*   A:  Hyperglycemia - off insulin gtt  P:   Lantus, SSI  NEUROLOGIC  A:  Acute Encephalopathy, possible anoxia  Mental status waxing and waning, intermittent agitation noted per RN, now lethargic  P:   Status post code PRN pain mgmt  Minimize  sedation  Consider CT head if no improvement   BEST PRACTICE / DISPOSITION Level of Care:  Critical Primary Service:  Critical care services Consultants:  GI Code Status:  Full (per d/w NP Anders Simmonds who d/w family  -  full code for now but if HD fails family open to discussions about palliation) Diet:  N.p.o. DVT Px:  None at this time possible GI bleed, no heparin. Lower extremities wounds no SCDs GI Px:  Protonix Social / Family:  No family available 10/27    [facility], NP 06/22/2012  8:43 AM Pager: [phone] or [phone]  *Care during the described time interval was provided by me and/or other providers on the critical care team. I have reviewed this patient's available data, including medical history, events of note, physical examination and test results as part of my evaluation.   Reviewed above, examined pt, and agree with assesment/plan.  Has metabolic alkalosis>>changes to CRRT made by renal.  F/u CBC and transfuse as needed.  Continue full vent support for now.  F/u ABG later today.  Critical care time 35 minutes.  Coralyn Helling, MD [facility] Pulmonary/Critical Care 06/22/2012, 11:11 AM Pager:  [phone] After 3pm call: [phone]

## 2012-06-22 NOTE — Significant Event (Signed)
Pt self-extubated.  Has adequate mental status, and cough.  No stridor.  Maintaining oxygenation on 3 liters nasal cannula.  Will monitor, and use BPAP as needed.  Coralyn Helling, MD [facility] Pulmonary/Critical Care 06/22/2012, 1:16 PM Pager:  [phone] After 3pm call: [phone]

## 2012-06-22 NOTE — Progress Notes (Signed)
Subjective: Since I last evaluated the patient, she is extubated [self extubated]. She continues to have melena and has had her 8th unit of PRC's. I suspect she has a colonic or small bowel AVM but due to her other co-morbidities, I do not feel a colonoscopy can be done on her. I have tried to find her family today but they had already gone home.    Objective: Vital signs in last 24 hours: Temp:  [97.5 F (36.4 C)-100.6 F (38.1 C)] 97.7 F (36.5 C) (10/27 1600) Pulse Rate:  [70-94] 78  (10/27 1900) Resp:  [5-24] 20  (10/27 1900) BP: (84-135)/(40-90) 122/62 mmHg (10/27 1900) SpO2:  [99 %-100 %] 100 % (10/27 1800) Arterial Line BP: (104-147)/(45-76) 113/76 mmHg (10/27 1900) FiO2 (%):  [40 %] 40 % (10/27 1200) Weight:  [84.9 kg (187 lb 2.7 oz)] 84.9 kg (187 lb 2.7 oz) (10/27 0300) Last BM Date: 06/22/12  Intake/Output from previous day: 10/26 0701 - 10/27 0700 In: 5742.2 [I.V.:5292.2; IV Piggyback:450] Out: 6665 [Emesis/NG output:600; Stool:125] Intake/Output this shift: Total I/O In: -  Out: 371 [Other:371]  General appearance: appears older than stated age, fatigued and mild distress Resp: bilateral rhonchi present Cardio: regular rate and rhythm, S1, S2 normal, no murmur, click, rub or gallop GI: soft, obese, diffusely tender with hypoactive BS; no masses,  no organomegaly Extremities: have 1+ pitting edema with excoriations on the lower limbs  Lab Results:  Basename 06/22/12 1630 06/22/12 0406 06/21/12 2320 06/21/12 2045  WBC 30.8* 30.9* -- 34.2*  HGB 9.4* 6.5* 8.2* --  HCT 28.6* 19.9* 24.0* --  PLT 172 168 -- 186   BMET  Basename 06/22/12 1630 06/22/12 0556 06/21/12 2320 06/21/12 2045  NA 142 144143 142 --  K 3.6 3.53.5 3.7 --  CL 97 9695* -- 96  CO2 34* 3234* -- 34*  GLUCOSE 152* 159*160* -- 160*  BUN 26* 31*32* -- 35*  CREATININE 2.07* 2.13*2.13* -- 2.04*  CALCIUM 8.9 8.68.7 -- 8.5   LFT  Basename 06/22/12 1630  PROT --  ALBUMIN 1.8*  AST --    ALT --  ALKPHOS --  BILITOT --  BILIDIR --  IBILI --   PT/INR  Basename 06/21/12 1805  LABPROT 17.4*  INR 1.47   Studies/Results: Dg Chest Port 1 View  06/22/2012  *RADIOLOGY REPORT*  Clinical Data: Respiratory failure  PORTABLE CHEST - 1 VIEW  Comparison:   the previous day's study  Findings: The nasogastric tube has been removed.  Endotracheal tube and tunneled right IJ hemodialysis catheter are stable in position. Left retrocardiac consolidation / atelectasis persists.  Probable small left effusion.  Mild bibasilar interstitial edema or infiltrates, slightly more conspicuous than on prior exam. Vascular stent projects in the left axilla.  IMPRESSION:  1.  Slight increase in bibasilar interstitial edema or infiltrates. 2.  Persistent small left effusion and left lower lung consolidation / atelectasis.   Original Report Authenticated By: Osa Craver, M.D.    Dg Chest Port 1 View  06/21/2012  *RADIOLOGY REPORT*  Clinical Data: Check endotracheal tube.  PORTABLE CHEST - 1 VIEW  Comparison: 06/20/2012  Findings: Endotracheal tube tip measures about 4.2 cm above the carina.  Enteric tube tip is below the left hemidiaphragm but not visible on the image.  Right central venous catheter with tip over the right atrium.  Borderline heart size with normal pulmonary vascularity.  Probable small pleural effusions bilaterally. Infiltration or atelectasis in the left lung base.  No pneumothorax.  IMPRESSION: Appliances appear unchanged in position.  Small bilateral pleural effusions.  Infiltration or atelectasis in the left lung base.   Original Report Authenticated By: Marlon Pel, M.D.    Medications: I have reviewed the patient's current medications.  Assessment/Plan: 1) GIB/Melena with no definite source of bleeding identified so far. Will discuss with family tomorrow about further interventions from a GI standpoint. 2) Diabetic gastroparesis.  3) ESRD on HD-on compliant.  4)  Shock/sp cardiac arrest.  5) Leukocytosis: ?pneumonia on CXR. On Vancomycin and Zosyn. 6) Iron deficiency anemia: on Ferric Gluconate.  LOS: 5 days   [name] 06/22/2012, 8:13 PM

## 2012-06-22 NOTE — Progress Notes (Signed)
[facility]  Subjective: Intubated, sedated, off pressors  Poorly functioning R IJ tunneled cath was replaced over wire Friday in IR and was working better according to RNs  Off CVVH since last night due to worsening metablolic alkalosis (on citrate and had been on NG suction)  Objective: Vital signs in last 24 hours: Blood pressure 116/55, pulse 78, temperature 99 F (37.2 C), temperature source Oral, resp. rate 16, height 6\' 1"  (1.854 m), weight 84.9 kg (187 lb 2.7 oz), last menstrual period 09/03/2008, SpO2 100.00%.  PHYSICAL EXAM General--as above  Chest--rhonchi  Heart--no rub  Abd--tender diffusely--no rebound  Extr--1+ edema of UE and LE     Lab Results:   Lab 06/22/12 0556 06/21/12 2320 06/21/12 2314 06/21/12 2045 06/21/12 1239  NA 144143 142 142 -- --  K 3.53.5 3.7 3.0* -- --  CL 9695* -- -- 96 95*  CO2 3234* -- -- 34* 35*  BUN 31*32* -- -- 35* 46*  CREATININE 2.13*2.13* -- -- 2.04* 2.28*  ALB -- -- -- -- --  GLUCOSE 159*160* -- -- -- --  CALCIUM 8.68.7 -- -- 8.5 8.3*  PHOS 3.7 -- -- 3.2 3.7     Basename 06/22/12 0406 06/21/12 2320 06/21/12 2045  WBC 30.9* -- 34.2*  HGB 6.5* 8.2* --  HCT 19.9* 24.0* --  PLT 168 -- 186   I have reviewed the patient's current medications.   Assessment/Plan: 1. ESRD- CVVH for renal replacement therapy.  Will try NO anticoag. Since heparin is not used(with GI bleed) and citrate contributed (along with NG suction) to met alkalosis, will try CVVH with NO anticoag.   Fluids 4K/2.5 Ca. - 120 cc/hr  2. Anemia--on aranesp and venofer 100/wk. Got 2 U PRBC Friday-hgb 6.5 this AM. EGD yesterday--"gastritis.". Melena in flexiseal.  On protonix drip.  PRBC today 3 . DM--glu 160 this AM--SSI + Lantus--CCM to adjust  4. PVD--no new suggestions  5. CAD-- " " "  6. Diarrhea--c. diff NEG  7. Sacral decub-- Santyl ointment and wound care consult. LE cellulitis--on vanco  9. Secondary PTH--phosphorus better. Will  give P binders once off CVVVH and eating  10. Rectal bleed-- GI has seen--see 2 above.  May need flex.sig/colonoscopy  11. VDRF--CXR shows vascular congestion, she has LOTS of excess fluid (EDW 80 kg and she weighs today 84.9 kg). Will try to UF - 120cc/hr if BP allows. Last CVP  18. On Vanco/zosyn  12. Met alkalosis--from NG losses and citrate. Will Rx IV NS 250/hr.  Off citrate    LOS: 5 days   Markisha Meding F 06/22/2012,8:50 AM   .labalb

## 2012-06-22 NOTE — Progress Notes (Signed)
ANTIBIOTIC CONSULT NOTE - FOLLOW UP  Pharmacy Consult:  Vancomycin/Zosyn Indication: cellulitis   Allergies  Allergen Reactions  . Compazine (Prochlorperazine) Swelling    Per echart records (takes promethazine at home)  . Contrast Media (Iodinated Diagnostic Agents) Swelling    Per echart records  . Meperidine Hcl Swelling  . Metformin And Related Other (See Comments)    'just not myself, was told not to take metformin'  . Morphine Swelling  . Nubain (Nalbuphine Hcl) Swelling  . Tobramycin Other (See Comments)    Unknown reaction  . Topiramate (Topamax) Swelling    Tongue swelling - per echart records  . Toradol (Ketorolac Tromethamine) Swelling    Patient Measurements: Height: 6\' 1"  (185.4 cm) Weight: 187 lb 2.7 oz (84.9 kg) IBW/kg (Calculated) : 75.4   Vital Signs: Temp: 100.6 F (38.1 C) (10/27 1030) Temp src: Oral (10/27 1030) BP: 84/40 mmHg (10/27 1000) Pulse Rate: 87  (10/27 1030) Intake/Output from previous day: 10/26 0701 - 10/27 0700 In: 5742.2 [I.V.:5292.2; IV Piggyback:450] Out: 6665 [Emesis/NG output:600; Stool:125] Intake/Output from this shift: Total I/O In: 400 [I.V.:50; Blood:350] Out: -   Labs:  Basename 06/22/12 0556 06/22/12 0406 06/21/12 2320 06/21/12 2314 06/21/12 2045 06/21/12 1239  WBC -- 30.9* -- -- 34.2* 35.8*  HGB -- 6.5* 8.2* 7.1* -- --  PLT -- 168 -- -- 186 193  LABCREA -- -- -- -- -- --  CREATININE 2.13*2.13* -- -- -- 2.04* 2.28*   Estimated Creatinine Clearance: 41 ml/min (by C-G formula based on Cr of 2.13). No results found for this basename: VANCOTROUGH:2,VANCOPEAK:2,VANCORANDOM:2,GENTTROUGH:2,GENTPEAK:2,GENTRANDOM:2,TOBRATROUGH:2,TOBRAPEAK:2,TOBRARND:2,AMIKACINPEAK:2,AMIKACINTROU:2,AMIKACIN:2, in the last 72 hours   Assessment: 43 YOF admitted recently for lower extremity cellulitis and was placed on IV Vancomycin and Zosyn. Zosyn was changed to Augmentin and the Vancomycin was to be continued through 10/22. She returned  10/22 with noted skin breakdown on her buttocks and to continue on her current antibiotics.  She has ESRD and typically gets HD on TTS; her last HD session was on Saturday 06/14/12 PTA.  Patient recd partial HD 10/23 (only ~1.5 hrs d/t pt refusal).   Ms. Hellmer is s/p cardio-pulmonary 10/24. Was initiated on CVVHD that day d/t hemodynamic instability requiring norepi for BP support and bicarb for acidosis. Noted CVVHD stopped ~ 0100 d/t alkalosis. Plans are to resume CVVHD. BP is low nml, pt is currently off norepi.  Pt continues to have low grade fevers and leukocytosis at 30.9 and continues on empiric Vanc and Zosyn.  Cultures:  10/23 C. Difficile: negative 10/23 MRSA PCR: negative  10/26 Blood x2:  Antibiotics:  Augmentin 10/23>>>10/24  Zosyn 10/24>>>  Vancomycin during dialysis 10/13>>>  Goal of Therapy:  Vanc trough 15-30 mcg/ml  Plan: - Continue Vancomycin 1 gm IV q 24h- CVVHD to restart this AM, noted plans for no anticoag. - Continue Zosyn 3.375gm IV q 6h. - Will monitor cx/spec/sens, RRT plans and clinical status daily. - Will f/up to adjust Vanc if CVVHD stops/interrupted   Thanks, Reigna Ruperto K. Allena Katz, PharmD, BCPS.  Clinical Pharmacist Pager [phone]. 06/22/2012 10:46 AM

## 2012-06-22 NOTE — Progress Notes (Signed)
eLink Physician-Brief Progress Note Patient Name: [name] DOB: 1971/01/15 MRN: [id number]  Date of Service  06/22/2012   HPI/Events of Note  Hgb down to 6.5 from 8.2     eICU Interventions  Plan: Transfuse 1 unit of pRBC Post-txf CBC and BMET   Intervention Category Intermediate Interventions: Bleeding - evaluation and treatment with blood products  DETERDING,ELIZABETH 06/22/2012, 4:25 AM

## 2012-06-22 NOTE — Progress Notes (Signed)
Family Medicine Social Visit  Visited with patient and nursing staff today. Pressors weaned off in AM 10/25. Unable to extubate due to mental status. Concern for anoxic brain injury during Code.  Patient with EGD which did not showed distal esophagitis, a suction injury from OG, stress gastritis. PPI has been continued. Patient continues to have melena in flexiseal with consideration being made for of flex sig as hgb still dropping requiring 1 unit PRBC overnight. Plan to hold heparin in CRRT. Patient's CRRT was held overnight due to worsening metabolic alkalosis and appears will be restarted this AM. Metabolic alkalosis likely due to GI blood loss and citrate (now held).   We greatly appreciate CCM, GI, and Renal management while patient in ICU. Will continue to follow with you until she is medically stable to return to [facility] Medicine Teaching Service.  Aldine Contes. Marti Sleigh, MD, PGY2 06/22/2012 9:22 AM

## 2012-06-23 ENCOUNTER — Encounter (HOSPITAL_COMMUNITY): Payer: Self-pay | Admitting: Radiology

## 2012-06-23 ENCOUNTER — Inpatient Hospital Stay (HOSPITAL_COMMUNITY): Payer: Medicare Other

## 2012-06-23 DIAGNOSIS — R1310 Dysphagia, unspecified: Secondary | ICD-10-CM | POA: Diagnosis present

## 2012-06-23 DIAGNOSIS — F411 Generalized anxiety disorder: Secondary | ICD-10-CM

## 2012-06-23 DIAGNOSIS — F419 Anxiety disorder, unspecified: Secondary | ICD-10-CM | POA: Diagnosis present

## 2012-06-23 DIAGNOSIS — G5793 Unspecified mononeuropathy of bilateral lower limbs: Secondary | ICD-10-CM | POA: Diagnosis present

## 2012-06-23 DIAGNOSIS — M79606 Pain in leg, unspecified: Secondary | ICD-10-CM | POA: Diagnosis present

## 2012-06-23 DIAGNOSIS — M79609 Pain in unspecified limb: Secondary | ICD-10-CM

## 2012-06-23 LAB — TYPE AND SCREEN
Antibody Screen: NEGATIVE
Unit division: 0
Unit division: 0
Unit division: 0
Unit division: 0

## 2012-06-23 LAB — CBC
HCT: 30.1 % — ABNORMAL LOW (ref 36.0–46.0)
HCT: 31 % — ABNORMAL LOW (ref 36.0–46.0)
Hemoglobin: 9.4 g/dL — ABNORMAL LOW (ref 12.0–15.0)
Hemoglobin: 9.6 g/dL — ABNORMAL LOW (ref 12.0–15.0)
Hemoglobin: 9.9 g/dL — ABNORMAL LOW (ref 12.0–15.0)
MCH: 30.7 pg (ref 26.0–34.0)
MCV: 96 fL (ref 78.0–100.0)
Platelets: 147 10*3/uL — ABNORMAL LOW (ref 150–400)
RBC: 3.13 MIL/uL — ABNORMAL LOW (ref 3.87–5.11)
RBC: 3.2 MIL/uL — ABNORMAL LOW (ref 3.87–5.11)
RBC: 3.23 MIL/uL — ABNORMAL LOW (ref 3.87–5.11)
WBC: 27.1 10*3/uL — ABNORMAL HIGH (ref 4.0–10.5)
WBC: 26.2 10*3/uL — ABNORMAL HIGH (ref 4.0–10.5)

## 2012-06-23 LAB — BLOOD GAS, ARTERIAL
Drawn by: 252031
O2 Content: 2 L/min
O2 Saturation: 91.7 %
pCO2 arterial: 38.7 mmHg (ref 35.0–45.0)
pH, Arterial: 7.417 (ref 7.350–7.450)
pO2, Arterial: 61.6 mmHg — ABNORMAL LOW (ref 80.0–100.0)

## 2012-06-23 LAB — RENAL FUNCTION PANEL
Albumin: 1.8 g/dL — ABNORMAL LOW (ref 3.5–5.2)
Albumin: 1.8 g/dL — ABNORMAL LOW (ref 3.5–5.2)
BUN: 15 mg/dL (ref 6–23)
BUN: 12 mg/dL (ref 6–23)
Creatinine, Ser: 1.59 mg/dL — ABNORMAL HIGH (ref 0.50–1.10)
GFR calc non Af Amer: 49 mL/min — ABNORMAL LOW (ref >90)
Glucose, Bld: 153 mg/dL — ABNORMAL HIGH (ref 70–99)
Phosphorus: 3.7 mg/dL (ref 2.3–4.6)
Phosphorus: 3.7 mg/dL (ref 2.3–4.6)
Potassium: 3.9 mEq/L (ref 3.5–5.1)
Potassium: 4.4 mEq/L (ref 3.5–5.1)
Sodium: 142 mEq/L (ref 135–145)

## 2012-06-23 LAB — GLUCOSE, CAPILLARY
Glucose-Capillary: 118 mg/dL — ABNORMAL HIGH (ref 70–99)
Glucose-Capillary: 128 mg/dL — ABNORMAL HIGH (ref 70–99)
Glucose-Capillary: 139 mg/dL — ABNORMAL HIGH (ref 70–99)

## 2012-06-23 LAB — MAGNESIUM: Magnesium: 2.1 mg/dL (ref 1.5–2.5)

## 2012-06-23 MED ORDER — OXYCODONE-ACETAMINOPHEN 5-325 MG PO TABS
1.0000 | ORAL_TABLET | ORAL | Status: DC | PRN
  Administered 2012-06-24: 1 via ORAL
  Filled 2012-06-23: qty 1

## 2012-06-23 MED ORDER — STARCH (THICKENING) PO POWD
ORAL | Status: DC | PRN
  Filled 2012-06-23 (×3): qty 227

## 2012-06-23 MED ORDER — LORAZEPAM 2 MG/ML IJ SOLN
0.5000 mg | Freq: Four times a day (QID) | INTRAMUSCULAR | Status: DC | PRN
  Administered 2012-06-23 – 2012-06-24 (×3): 1 mg via INTRAVENOUS
  Filled 2012-06-23 (×3): qty 1

## 2012-06-23 MED FILL — Heparin Sodium (Porcine) Inj 1000 Unit/ML: INTRAMUSCULAR | Qty: 10 | Status: AC

## 2012-06-23 MED FILL — Chlorhexidine Gluconate Liquid 4%: CUTANEOUS | Qty: 30 | Status: AC

## 2012-06-23 NOTE — Progress Notes (Signed)
Palliative Medicine Team SW Received consult for GOC related to pt's end stage renal access. Met with pt, mtr and sisters at bedside. Pt alert and oriented, family anxious to schedule GOC today at 2:30pm as one sister must return to [place]. Provided PMT contact info to family and communicated plan to RN and unit secretary as Logan Bores will fall during unit quiet hours, appreciate floor staff's accomodation.   [name], [facility] Team Phone [phone]

## 2012-06-23 NOTE — Evaluation (Signed)
Clinical/Bedside Swallow Evaluation Patient Details  Name: [name] MRN: [id number] Date of Birth: 10-18-1970  Today's Date: 06/23/2012 Time:  -     Past Medical History:  Past Medical History  Diagnosis Date  . DVT (deep venous thrombosis)     DVT HISTORY  . Diabetes mellitus   . Migraine     HISTORY  . CAD (coronary artery disease)     Last cath 2009:  LAD stent 80% stenosis. Circumflex 60-70% stenosis AV groove, right coronary artery 50-60% stenosis. She did have cutting balloon angioplasty of the LAD lesion into a diagonal. However, there was restenosis of this and it was managed medically.  . Hyperlipidemia   . Hyperparathyroidism   . PE (pulmonary embolism)     HISTORY  . Dialysis patient   . Anemia   . Renal failure     dialysis [place] t,th sat  . Nephrotic syndrome   . Peripheral vascular disease   . MRSA infection   . Anxiety   . Gastroparesis   . Complication of anesthesia   . PONV (postoperative nausea and vomiting)     x3 days post anesth. on 12/19/2011  . Myocardial infarction     x3 , last one - 10 yrs. ago  . Hypertension     sees Dr. Antoine Poche - , last stress test 11/2011, see result in EPIC, saw Dr. Antoine Poche as well at that time    . Shortness of breath   . Recurrent upper respiratory infection (URI)     Bronchitis 11/2011 - saw Dr. Loney Hering, treating currently /w antibiotic   . GERD (gastroesophageal reflux disease)   . Pneumonia     [facility]- 2012  . Noncompliance of patient with renal dialysis   . Cellulitis Oct. 2013  . MRSA (methicillin resistant staph aureus) culture positive Oct 2013  . Anxiety and depression   . Bipolar 1 disorder   . Depression    Past Surgical History:  Past Surgical History  Procedure Date  . Incise and drain abcess     OF THIGHS FROM INSULIN INJECTIONS  . Heart stents   . Portacath placement 2003  . Cataract extraction w/phaco 11/09/2011    Procedure: CATARACT EXTRACTION PHACO AND INTRAOCULAR LENS PLACEMENT (IOC);   Surgeon: Edmon Crape, MD;  Location: [facility] OR;  Service: Ophthalmology;  Laterality: Right;  . Pars plana vitrectomy 11/09/2011    Procedure: PARS PLANA VITRECTOMY WITH 23 GAUGE;  Surgeon: Edmon Crape, MD;  Location: [facility] OR;  Service: Ophthalmology;  Laterality: Right;  Ahmed Valve; Scleral Reinforcement Graft  . Dg av dialysis  shunt access exist*l* or 12/13/11    left arm  . Dialysis cath inserted & portacath      dialysis catheter inserted & portacath d/c'd- 12/19/2011  . Back surgery X 4    x4 times   . Cholecystectomy   . Eye surgery   . Cardiac catheterization   . Aorta - bilateral femoral artery bypass graft 12/28/2011    Procedure: AORTA BIFEMORAL BYPASS GRAFT;  Surgeon: Nada Libman, MD;  Location: [facility] OR;  Service: Vascular;  Laterality: N/A;  AortoBifemoral Bypass using hemasheild 14x7 graft  . Femoral-popliteal bypass graft 12/28/2011    Procedure: BYPASS GRAFT FEMORAL-POPLITEAL ARTERY;  Surgeon: Nada Libman, MD;  Location: [facility] OR;  Service: Vascular;  Laterality: Right;  right femoral popliteal bypass using 6x80 propaten graft  . Insertion of dialysis catheter 02/15/2012    Procedure: INSERTION OF DIALYSIS CATHETER;  Surgeon: Cristal Deer  Adele Dan, MD;  Location: [facility] OR;  Service: Vascular;  Laterality: Right;  insertion of dialysis catheter right internal jugular vein,removal of left internal jugular vein dialysis catheter  . Insertion of dialysis catheter 02/21/2012    Procedure: INSERTION OF DIALYSIS CATHETER;  Surgeon: Chuck Hint, MD;  Location: [facility] OR;  Service: Cardiovascular;  Laterality: N/A;  Insertion new dialysis catheter; possible venoplasty of fibrin sheath of  Right IJ catheter  . Venogram 02/21/2012    Procedure: VENOGRAM;  Surgeon: Chuck Hint, MD;  Location: [facility] OR;  Service: Cardiovascular;  Laterality: N/A;  possible venoplasty  . Insertion of dialysis catheter 04/23/2012    Procedure: INSERTION OF DIALYSIS CATHETER;  Surgeon: Sherren Kerns, MD;   Location: [facility] OR;  Service: Vascular;  Laterality: Right;  using a 29fr. x 23 cannon catheter in Right Internal Jugular  . Esophagogastroduodenoscopy 06/21/2012    Procedure: ESOPHAGOGASTRODUODENOSCOPY (EGD);  Surgeon: Charna Elizabeth, MD;  Location: [facility] ENDOSCOPY;  Service: Endoscopy;  Laterality: N/A;  bedside   HPI:  41 yo female admitted on 06/17/2012 for HD graft revision.  She was noted to have elevated glucose and anion gap.  She had difficulty with HD 10/23, and then developed cardiac arrest 2nd to hypovolemic shock from GI bleed and hyperkalemia 10/24.  Transferred to ICU 10/24, and PCCM assumed care.  She had recent tx for leg cellulitis.  Patient extubated herself 10/27.  Patient and family report that patient has had voice disorder since previous intubation approximately 6 weeks ago.  Dysphonia, hoarsness noted.   Assessment / Plan / Recommendation Clinical Impression  Patient exhibits s/s of aspiration with thin liquids, and does not have dentures currently for attempting solids.    Aspiration Risk  Moderate    Diet Recommendation Dysphagia 2 (Fine chop);Nectar-thick liquid   Liquid Administration via: Cup Medication Administration: Whole meds with puree Supervision: Full supervision/cueing for compensatory strategies Compensations: Slow rate;Small sips/bites Postural Changes and/or Swallow Maneuvers: Seated upright 90 degrees    Other  Recommendations Recommended Consults: FEES Oral Care Recommendations: Oral care QID;Staff/trained caregiver to provide oral care Other Recommendations: Order thickener from pharmacy;Have oral suction available;Clarify dietary restrictions   Follow Up Recommendations  24 hour supervision/assistance    Frequency and Duration        Pertinent Vitals/Pain n/a    SLP Swallow Goals Patient will consume recommended diet without observed clinical signs of aspiration with: Moderate assistance   Swallow Study Prior Functional Status         General HPI: 41 yo female admitted on 06/17/2012 for HD graft revision.  She was noted to have elevated glucose and anion gap.  She had difficulty with HD 10/23, and then developed cardiac arrest 2nd to hypovolemic shock from GI bleed and hyperkalemia 10/24.  Transferred to ICU 10/24, and PCCM assumed care.  She had recent tx for leg cellulitis.  Patient extubated herself 10/27.  Patient and family report that patient has had voice disorder since previous intubation approximately 6 weeks ago.  Dysphonia, hoarsness noted. Type of Study: Bedside swallow evaluation Previous Swallow Assessment: 04/22/12 BSE Dys. 1 with thin liquids. Diet Prior to this Study: NPO Temperature Spikes Noted: Yes Respiratory Status: Supplemental O2 delivered via (comment) History of Recent Intubation: Yes Length of Intubations (days): 2 days Date extubated: 06/22/12 Behavior/Cognition: Alert;Cooperative Oral Cavity - Dentition: Edentulous;Dentures, not available Self-Feeding Abilities: Needs assist;Needs set up Patient Positioning: Upright in bed Baseline Vocal Quality: Hoarse;Breathy (Dysphonia since extubation on last admit.) Volitional Cough: Weak  Volitional Swallow: Able to elicit    Oral/Motor/Sensory Function Overall Oral Motor/Sensory Function: Appears within functional limits for tasks assessed   Ice Chips Ice chips: Impaired Presentation: Spoon Pharyngeal Phase Impairments: Throat Clearing - Immediate   Thin Liquid Thin Liquid: Impaired Pharyngeal  Phase Impairments: Cough - Immediate    Nectar Thick Nectar Thick Liquid: Within functional limits   Honey Thick Honey Thick Liquid: Not tested   Puree Puree: Within functional limits   Solid   GO    Solid: Not tested       Maryjo Rochester T 06/23/2012,4:14 PM

## 2012-06-23 NOTE — Progress Notes (Signed)
06/23/12 1800  Clinical Encounter Type  Visited With Patient and family together  Visit Type Initial  Referral From Nurse   I visited and prayed with [name], her mother, and 2 sisters. I will follow up with her. Chaplain Vonzella Nipple

## 2012-06-23 NOTE — Progress Notes (Signed)
Name: [name] MRN: [id number] DOB: 1970-09-29    LOS: 6 PCP is Ernestine Conrad, MD Referring: [facility]PTS PULMONARY / CRITICAL CARE MEDICINE  HPI:   41 yo female admitted on 06/17/2012 for HD graft revision.  She was noted to have elevated glucose and anion gap.  She had difficulty with HD 10/23, and then developed cardiac arrest 2nd to hypovolemic shock from GI bleed and hyperkalemia July 10, 2023.  Transferred to ICU July 10, 2023, and PCCM assumed care.  She had recent tx for leg cellulitis.  Signficant PMHx ESRD on HD, CAD, DM, DVT, gastroparesis, PVD, Bipolar, HTN   Events Since Admission: Patient was on 3300 being treated for hyperglycemia coded the morning of 10-Jul-2023 and sent to ICU 2104. 10/25 Was placed on CRRT and continues on  Vent, Levophed drip and Insulin drip. 10/27 self extubated, EGD  showing esophagitis, gastritis and ?suction injury from OGT.   Off pressors.  Subjective/ Overnight:  Off pressors, able to speak, c/o pain & anxiety   Vital Signs: Temp:  [97.3 F (36.3 C)-97.8 F (36.6 C)] 97.5 F (36.4 C) (10/28 1242) Pulse Rate:  [66-86] 79  (10/28 1300) Resp:  [5-24] 22  (10/28 1442) BP: (91-144)/(40-104) 144/104 mmHg (10/28 1400) SpO2:  [95 %-100 %] 99 % (10/28 1200) Arterial Line BP: (113-159)/(59-78) 159/76 mmHg (10/28 1400) Weight:  [83.2 kg (183 lb 6.8 oz)] 83.2 kg (183 lb 6.8 oz) (10/28 0500)  Physical Examination: General:  Chronically ill appearing female, appears greater than stated age  Neuro:  Alert, awake, non focal  HEENT:   ETT tube in place mucous membranes are moist Cardiovascular: s1s2 rrr, distant, NSR  Lungs: resps even non labored on vent, few scattered ronchi, diminished bases  Abdomen:  Obese soft nontender midline scar noted Musculoskeletal:  Moves all extremities Skin:  Lower extremities with areas of skin breakdown noted. Scarring noted in bilateral groins. Scarring and old incisions noted right lower extremity probable hemodialysis site.  Principal  Problem:  *DKA (diabetic ketoacidosis) Active Problems:  Diabetic gastroparesis  ESRD on dialysis  DM (diabetes mellitus), type 2 with complications  Tobacco abuse  Hyperparathyroidism, secondary renal  Bipolar 1 disorder  Diabetic leg ulcer  Atherosclerosis of native arteries of the extremities with ulceration(440.23)  Left leg cellulitis  Acute respiratory failure with hypoxia  Shock circulatory  Cardiac arrest  Dg Chest Port 1 View  06/23/2012  *RADIOLOGY REPORT*  Clinical Data: Follow-up atelectasis.  Shortness of breath.  PORTABLE CHEST - 1 VIEW  Comparison: 06/22/2012.  Findings: Interval extubation.  Right IJ dialysis catheter tips project over the right atrium.  Heart size stable.  Worsening left upper and left lower lobe air space consolidation with an enlarging left pleural effusion.  Small right pleural effusion.  IMPRESSION:  1.  Worsening left upper and left lower lobe air space consolidation and enlarging left pleural effusion. 2.  Small right pleural effusion.   Original Report Authenticated By: Reyes Ivan, M.D.    Dg Chest Port 1 View  06/22/2012  *RADIOLOGY REPORT*  Clinical Data: Respiratory failure  PORTABLE CHEST - 1 VIEW  Comparison:   the previous day's study  Findings: The nasogastric tube has been removed.  Endotracheal tube and tunneled right IJ hemodialysis catheter are stable in position. Left retrocardiac consolidation / atelectasis persists.  Probable small left effusion.  Mild bibasilar interstitial edema or infiltrates, slightly more conspicuous than on prior exam. Vascular stent projects in the left axilla.  IMPRESSION:  1.  Slight increase in bibasilar  interstitial edema or infiltrates. 2.  Persistent small left effusion and left lower lung consolidation / atelectasis.   Original Report Authenticated By: Thora Lance III, M.D.      ASSESSMENT AND PLAN  PULMONARY  Lab 06/23/12 0545 06/22/12 1500 06/22/12 0415 06/21/12 2320 06/21/12 2314  06/21/12 2054  PHART 7.417 7.508* 7.614* -- 7.609* 7.557*  PCO2ART 38.7 45.0 38.4 -- 38.6 43.9  PO2ART 61.6* 79.0* 77.0* -- 60.0* 25.0*  HCO3 24.5* 35.7* 38.7* 40.2* 38.7* --  O2SAT 91.7 97.0 97.0 52.0 94.0 --   Ventilator Settings:    ETT: 10/25>>10/27   A:  Respiratory failure s/p cardiopulmonary arrest.  P:  resolved   CARDIOVASCULAR  Lab 06/19/12 1330 06/17/12 1633 06/17/12 1148  TROPONINI -- -- --  LATICACIDVEN 4.4* -- 1.3  PROBNP -- 16777.0* --    Lines:  R Subclavian hemodialysis catheter (IR) 10/25>>> right IJ short triple lumen (IR) 10/25>>>  A:  Cardiac arrest Shock: likely from GI bleed P:  Transfuse as needed for bleeding  off pressors   RENAL  Lab 06/23/12 1350 06/23/12 0500 06/22/12 1630 06/22/12 0556 06/22/12 0406 06/21/12 2320 06/21/12 2045 06/21/12 0317 06/20/12 0345 06/18/12 0200  NA 142 139 142 144143 -- 142 -- -- -- --  K 4.4 3.9 -- -- -- -- -- -- -- --  CL 106 102 97 9695* -- -- 96 -- -- --  CO2 23 26 34* 3234* -- -- 34* -- -- --  BUN 12 15 26* 31*32* -- -- 35* -- -- --  CREATININE 1.32* 1.59* 2.07* 2.13*2.13* -- -- 2.04* -- -- --  CALCIUM 8.7 8.8 8.9 8.68.7 -- -- 8.5 -- -- --  MG -- 2.1 -- -- 1.8 -- -- 2.2 2.6* 3.8*  PHOS 3.7 3.7 4.5 3.7 -- -- 3.2 -- -- --   Intake/Output      10/27 0701 - 10/28 0700 10/28 0701 - 10/29 0700   I.V. (mL/kg) 4651.3 (55.9) 1300 (15.6)   Blood 700    IV Piggyback 350 250   Total Intake(mL/kg) 5701.3 (68.5) 1550 (18.6)   Urine (mL/kg/hr) 125 (0.1)    Emesis/NG output     Other 7134 2373   Stool 250    Total Output 7509 2373   Net -1807.7 -823         Foley:  No Foley patient is anuric.  A:   Hyperkalemia Hypervolemia Metabolic acidosis: + anion gap, likely d/t end-organ hypoperfusion -- gap closed  Metabolic alkalosis: Likely from dialysate ESRD P:   Transition from CRRT to int HD, defer to renal - current catheter has been causing problems    GASTROINTESTINAL  Lab 06/23/12 1350  06/23/12 0500 06/22/12 1630 06/22/12 0556 06/21/12 2045 06/18/12 0200  AST -- -- -- -- -- 40*  ALT -- -- -- -- -- 18  ALKPHOS -- -- -- -- -- 211*  BILITOT -- -- -- -- -- 0.1*  PROT -- -- -- -- -- 7.3  ALBUMIN 1.8* 1.8* 1.8* 1.7* 1.7* --    A:  GI bleed. P:   EGD 10/26 revealed esophagitis, gastritis and possible suction injury from OGT  PO protonix No further procedures - see goals of care   HEMATOLOGIC  Lab 06/23/12 1340 06/23/12 0500 06/22/12 2220 06/22/12 1630 06/22/12 0406 06/21/12 1805 06/19/12 0918  HGB 9.6* 9.4* 9.2* 9.4* 6.5* -- --  HCT 30.1* 29.3* 28.4* 28.6* 19.9* -- --  PLT 191 147* 150 172 168 -- --  INR -- -- -- -- --  1.47 2.06*  APTT -- -- -- -- -- 45* 42*   A:  Anemia related to GI bleeding P:  Transfuse as needed for hgb <7 Monitor frequent H&H aranesp per renal   INFECTIOUS  Lab 06/23/12 1340 06/23/12 0500 06/22/12 2220 06/22/12 1630 06/22/12 0406  WBC 25.8* 27.1* 31.7* 30.8* 30.9*  PROCALCITON -- -- -- -- --   Cultures: C. difficile negative -10/23 MRSA negative by PCR 10/23  Antibiotics: Augmentin 10/23>>>10/24 Zosyn 10/24>>>10/28 Vancomycin during dialysis 10/24>>>  A:  Left lower extremity cellulitis P:   Continue antibiotics Consult pharmacy to dose vancomycin while patient is on CRRT   ENDOCRINE  Lab 06/23/12 1227 06/23/12 0751 06/23/12 0339 06/22/12 2346 06/22/12 1940  GLUCAP 108* 118* 138* 98 155*   A:  Hyperglycemia - off insulin gtt  P:   Lantus, SSI  NEUROLOGIC  A:  Acute Encephalopathy - resolved Anxiety, chronic pain P:   Prn fentnayl & ativan Resume home dose xanax & percocet when clears swallow   GloBAL - Discussed with family & pt-  They are agreeable to care limitations including no CPR, no cardioversion, no more procedures or access attempts. Would work within those limits to find the best strategy to take care of her bleeding & HD issues. Palliative care involved Transfer out once off CVVH     BEST PRACTICE  / DISPOSITION Level of Care:  Critical Primary Service:  Critical care services Consultants:  GI Code Status: limited DVT Px:  None at this time possible GI bleed, no heparin. Lower extremities wounds no SCDs GI Px:  Protonix Social / Family:  Updated    Care during the described time interval was provided by me and/or other providers on the critical care team.  I have reviewed this patient's available data, including medical history, events of note, physical examination and test results as part of my evaluation  CC time x  35 m  Cyril Mourning MD. Tonny Bollman. [facility] Pager [phone] If no response call [phone]

## 2012-06-23 NOTE — Progress Notes (Signed)
Patient [name]      DOB: 22-Nov-1970      VWU:[id number]  Palliative goals of Care meeting held with patient, her mother, [name], sisters, [name] and Birdie Riddle. Over all Goals of Care:  Desires to continue dialysis treatment as an outpatient, realizes there may come a point in near future when access for HD may not be feasible.  Does not want chest compressions, cardioversion or pulmonary intubation if cardiac or respiratory arrest occurs  Desires to be brought back to the hospital for for evaluation and treatment for any acute issues that may occur  Medical Orders for Scope of Medical Treatment form completed, signed and placed on chart. Full Palliative Care note to follow.  Freddie Breech, CNS-C Palliative Medicine Team [facility] Health Team Phone: [phone] Pager: [phone]

## 2012-06-23 NOTE — Progress Notes (Signed)
Subjective: Interval History: has complaints uncomfortable.  Objective: Vital signs in last 24 hours: Temp:  [97.3 F (36.3 C)-100.6 F (38.1 C)] 97.3 F (36.3 C) (10/28 0752) Pulse Rate:  [66-88] 78  (10/28 0800) Resp:  [5-21] 14  (10/28 0800) BP: (84-139)/(40-103) 139/69 mmHg (10/28 0800) SpO2:  [95 %-100 %] 97 % (10/28 0800) Arterial Line BP: (112-156)/(54-78) 148/73 mmHg (10/28 0800) FiO2 (%):  [40 %] 40 % (10/27 1200) Weight:  [83.2 kg (183 lb 6.8 oz)] 83.2 kg (183 lb 6.8 oz) (10/28 0500) Weight change: -1.7 kg (-3 lb 12 oz)  Intake/Output from previous day: 10/27 0701 - 10/28 0700 In: 5701.3 [I.V.:4651.3; Blood:700; IV Piggyback:350] Out: 7509 [Urine:125; Stool:250] Intake/Output this shift: Total I/O In: 50 [IV Piggyback:50] Out: 387 [Other:387]  General appearance: slowed mentation Resp: diminished breath sounds bilaterally, rales bilaterally and rhonchi bilaterally Cardio: S1, S2 normal and systolic murmur: systolic ejection 2/6, decrescendo at 2nd left intercostal space GI: pos bs,but decreased.  liver down 6 cm Extremities: edema 2+ Skin: pale, ischemic changes LE  Lab Results:  Basename 06/23/12 0500 06/22/12 2220  WBC 27.1* 31.7*  HGB 9.4* 9.2*  HCT 29.3* 28.4*  PLT 147* 150   BMET:  Basename 06/23/12 0500 06/22/12 1630  NA 139 142  K 3.9 3.6  CL 102 97  CO2 26 34*  GLUCOSE 153* 152*  BUN 15 26*  CREATININE 1.59* 2.07*  CALCIUM 8.8 8.9   No results found for this basename: PTH:2 in the last 72 hours Iron Studies: No results found for this basename: IRON,TIBC,TRANSFERRIN,FERRITIN in the last 72 hours  Studies/Results: Dg Chest Port 1 View  06/23/2012  *RADIOLOGY REPORT*  Clinical Data: Follow-up atelectasis.  Shortness of breath.  PORTABLE CHEST - 1 VIEW  Comparison: 06/22/2012.  Findings: Interval extubation.  Right IJ dialysis catheter tips project over the right atrium.  Heart size stable.  Worsening left upper and left lower lobe air space  consolidation with an enlarging left pleural effusion.  Small right pleural effusion.  IMPRESSION:  1.  Worsening left upper and left lower lobe air space consolidation and enlarging left pleural effusion. 2.  Small right pleural effusion.   Original Report Authenticated By: Reyes Ivan, M.D.    Dg Chest Port 1 View  06/22/2012  *RADIOLOGY REPORT*  Clinical Data: Respiratory failure  PORTABLE CHEST - 1 VIEW  Comparison:   the previous day's study  Findings: The nasogastric tube has been removed.  Endotracheal tube and tunneled right IJ hemodialysis catheter are stable in position. Left retrocardiac consolidation / atelectasis persists.  Probable small left effusion.  Mild bibasilar interstitial edema or infiltrates, slightly more conspicuous than on prior exam. Vascular stent projects in the left axilla.  IMPRESSION:  1.  Slight increase in bibasilar interstitial edema or infiltrates. 2.  Persistent small left effusion and left lower lung consolidation / atelectasis.   Original Report Authenticated By: Osa Craver, M.D.     I have reviewed the patient's current medications.  Assessment/Plan: 1 CRF vol xs, on CVVHD, good solute clearance, acid/base better.  K ok 2 Anemia stable 3 GIB needs colonscopy 4 Nutrition after procedure 5 COPD 6 Nonadherence cause of many of issues 7 Smoking 8 DM controlled 9 PVD,  10 CAD P CVVHD, epo, fe, colon, counsel, nutrition     LOS: 6 days   [name] L 06/23/2012,8:47 AM

## 2012-06-23 NOTE — Progress Notes (Signed)
I had a discussion with the patient and her family.  She does not want any further procedures.  The patient continues to have melena and her last blood transfusion was yesterday.  I will not pursue any further work up at this time.  There are plans to obtain a Palliative Care consult.  Signing off.

## 2012-06-23 NOTE — Progress Notes (Addendum)
Patient YQ:MVHQIO B Altice      DOB: Aug 02, 1971      NGE:[id number]     Consult Note from the Palliative Medicine Team at [facility]    Consult Requested by: Dr Vassie Loll     PCP: Ernestine Conrad, MD Reason for Consultation:Goals of Care     Phone Number:[phone]  Assessment of patients Current state: Patient sitting up in bed, alert, verbally responsive to questions, voice tone soft and muted but understandable. Receiving CVHD via central perm catheter upper right chest.   Reviewed chart, spoke with physician and staff caring for patient and proceeded to have goals of care meeting with the patient, her mother [name]), and her sisters, [name] and Birdie Riddle. Patient lives with mother, and sister Lupita Leash, who are her primary caregivers. 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 Patient has not been able to ambulate for past year, and has had to have several (8-9) access sites due to PVD and catheter thrombus, and has required intubation 3 times in the past 6 months for respiratory compromise.  Patient continues to smoke 3 packs of cigarettes /day. Discussed the adverse effects of continued smoking with patient on her pulmonary and vascular status.  Has chronic leg pain from PVD on average takes Percocet tabs (10mg  /325mg ) per day, in addition to Xanax for anxiety. Family is also receives care support from [facility] program (nurses aide services/26 hours per week).  We had a candid family/patient conversation about current medical issues, difficulty with maintaining dialysis catheter access, patients compliance with dialysis, and patients GI bleeding requiring blood transfusion to hemodynamically stablelize her. Patient states she gets tired of at times of all the needed care to maintain her life, but wants to continue receiving dialysis through newly placed catheter, as long as possible.  Family stated she gets tired of being sick and refuses to stay for whole HD treatment or refuses to go at times. We talked about when it reaches the point that HD access cannot be done, patient expressed her fears of death and the hardship and grief it would cause her mother. I informed family and patient that they are able to call in any hospice community service to provide end-of-life care when the point is reached as decision is made to stop dialysis therapy. Family told patient they would support either decision she would make regarding continuing or stopping dialysis.   We discussed the concepts of palliative care and hospice and the support that is provided by each. I discussed patients wishes for current and future care, we talked about the use of cardiac/pulmonary resuscitation, her desire pertinent to level of care she wishes to receive this hospitalization, and wishes for future re-hospitalizations in the event of acute needs, the use of antibiotic therapy, as well as use of intravenous fluids and artificial feeding tubes. Medical Orders for Scope of Treatment form completed, signed and placed on chart.  Goals of Care/Scope of Treatment: 1.  Code Status:DNR/DNI: discussed in depth the burden of pulmonary and cardiac in the setting of multiple co-morbidities. Patient agreed that she would not resuscitation attempts, and does not want chest compressions, cardioversion or pulmonary intubation performed. Currently is agreeing to vasopressor and antiarrythmic support.  -Desires to continue dialysis as an outpatient if possible. Both patient and family realize that due to poor access there will come a point when continuing dialysis may not be an option,  and that  End-of-life hospice support will need to be initiated.  -Wants to be brought back to the hospital for any acute medical issues, and would like all medical interventions as indicated, including blood product transfusion, respiratory treatments,  antibiotic therapy, IV fluid and medication support.   -Does NOT want the use of artificial tube feedings to sustain her nutrtionally     2. Disposition: Patient and family do not want to consider patient placement in a SNF upon discharge, would like to have her return home with home health services.   3. Symptom Management:   1. Anxiety/Agitation: Ativan IV as needed 2. Pain: admits to chronic leg pain, Fentanyl IV as needed, effective in relieving pain 3. Bowel Regimen: currently having loose stools due to GI bleed  4. Psychosocial:Emotional support to patient and family, agreeable to have palliative social worker speak with patient/family  5. Spiritual:agreeable to chaplin referral   Brief HPI: 41 yo WF with very complex, chronic medical history consisting of Bipolar disorder, poorly controlled DM 2, multiple vascular procedures due to PVD, ESRD requiring dialysis, CAD (prior MI), poorly controlled HTN, GI bleed.  She was admitted 06/17/12 for HD graft revision, she had issues with HD on 06/18/12, and sustained a cardiac arrest 2ndary to hypovolemic shock due to GI bleed and hyperkalemia on 10/24, requiring intubation. Patient self extubated on 10/27, evaluated by SLP today, has some difficulty with dysphagia, will be advanced from NPO to Dys 2 diet.   ROS: + productive cough, generalized pain, mainly in lower extremities, +dark tarry stools, + anxiety, denies n/v     PMH:  Past Medical History  Diagnosis Date  . DVT (deep venous thrombosis)     DVT HISTORY  . Diabetes mellitus   . Migraine     HISTORY  . CAD (coronary artery disease)     Last cath 2009:  LAD stent 80% stenosis. Circumflex 60-70% stenosis AV groove, right coronary artery 50-60% stenosis. She did have cutting balloon angioplasty of the LAD lesion into a diagonal. However, there was restenosis of this and it was managed medically.  . Hyperlipidemia   . Hyperparathyroidism   . PE (pulmonary embolism)      HISTORY  . Dialysis patient   . Anemia   . Renal failure     dialysis [place] t,th sat  . Nephrotic syndrome   . Peripheral vascular disease   . MRSA infection   . Anxiety   . Gastroparesis   . Complication of anesthesia   . PONV (postoperative nausea and vomiting)     x3 days post anesth. on 12/19/2011  . Myocardial infarction     x3 , last one - 10 yrs. ago  . Hypertension     sees Dr. Antoine Poche - [place], last stress test 11/2011, see result in EPIC, saw Dr. Antoine Poche as well at that time    . Shortness of breath   . Recurrent upper respiratory infection (URI)     Bronchitis 11/2011 - saw Dr. Loney Hering, treating currently /w antibiotic   . GERD (gastroesophageal reflux disease)   . Pneumonia     [facility]- 2012  . Noncompliance of patient with renal dialysis   . Cellulitis Oct. 2013  . MRSA (methicillin resistant staph aureus) culture positive Oct 2013  . Anxiety and depression   . Bipolar 1 disorder   . Depression      PSH: Past Surgical History  Procedure Date  . Incise and drain abcess     OF  THIGHS FROM INSULIN INJECTIONS  . Heart stents   . Portacath placement 2003  . Cataract extraction w/phaco 11/09/2011    Procedure: CATARACT EXTRACTION PHACO AND INTRAOCULAR LENS PLACEMENT (IOC);  Surgeon: Edmon Crape, MD;  Location: [facility] OR;  Service: Ophthalmology;  Laterality: Right;  . Pars plana vitrectomy 11/09/2011    Procedure: PARS PLANA VITRECTOMY WITH 23 GAUGE;  Surgeon: Edmon Crape, MD;  Location: [facility] OR;  Service: Ophthalmology;  Laterality: Right;  Ahmed Valve; Scleral Reinforcement Graft  . Dg av dialysis  shunt access exist*l* or 12/13/11    left arm  . Dialysis cath inserted & portacath      dialysis catheter inserted & portacath d/c'd- 12/19/2011  . Back surgery X 4    x4 times   . Cholecystectomy   . Eye surgery   . Cardiac catheterization   . Aorta - bilateral femoral artery bypass graft 12/28/2011    Procedure: AORTA BIFEMORAL BYPASS GRAFT;  Surgeon: Nada Libman, MD;   Location: [facility] OR;  Service: Vascular;  Laterality: N/A;  AortoBifemoral Bypass using hemasheild 14x7 graft  . Femoral-popliteal bypass graft 12/28/2011    Procedure: BYPASS GRAFT FEMORAL-POPLITEAL ARTERY;  Surgeon: Nada Libman, MD;  Location: [facility] OR;  Service: Vascular;  Laterality: Right;  right femoral popliteal bypass using 6x80 propaten graft  . Insertion of dialysis catheter 02/15/2012    Procedure: INSERTION OF DIALYSIS CATHETER;  Surgeon: Chuck Hint, MD;  Location: [facility] OR;  Service: Vascular;  Laterality: Right;  insertion of dialysis catheter right internal jugular vein,removal of left internal jugular vein dialysis catheter  . Insertion of dialysis catheter 02/21/2012    Procedure: INSERTION OF DIALYSIS CATHETER;  Surgeon: Chuck Hint, MD;  Location[facility] OR;  Service: Cardiovascular;  Laterality: N/A;  Insertion new dialysis catheter; possible venoplasty of fibrin sheath of  Right IJ catheter  . Venogram 02/21/2012    Procedure: VENOGRAM;  Surgeon: Chuck Hint, MD;  Location: [facility] OR;  Service: Cardiovascular;  Laterality: N/A;  possible venoplasty  . Insertion of dialysis catheter 04/23/2012    Procedure: INSERTION OF DIALYSIS CATHETER;  Surgeon: Sherren Kerns, MD;  Location: [facility] OR;  Service: Vascular;  Laterality: Right;  using a 80fr. x 23 cannon catheter in Right Internal Jugular  . Esophagogastroduodenoscopy 06/21/2012    Procedure: ESOPHAGOGASTRODUODENOSCOPY (EGD);  Surgeon: Charna Elizabeth, MD;  Location[facility] ENDOSCOPY;  Service: Endoscopy;  Laterality: N/A;  bedside   I have reviewed the FH and SH and  If appropriate update it with new information. Allergies  Allergen Reactions  . Compazine (Prochlorperazine) Swelling    Per echart records (takes promethazine at home)  . Contrast Media (Iodinated Diagnostic Agents) Swelling    Per echart records  . Meperidine Hcl Swelling  . Metformin And Related Other (See Comments)    'just not myself, was told not to  take metformin'  . Morphine Swelling  . Nubain (Nalbuphine Hcl) Swelling  . Tobramycin Other (See Comments)    Unknown reaction  . Topiramate (Topamax) Swelling    Tongue swelling - per echart records  . Toradol (Ketorolac Tromethamine) Swelling   Scheduled Meds:   . alteplase  4 mg Intracatheter Once  . alteplase  2 mg Intracatheter Once  . antiseptic oral rinse  15 mL Mouth Rinse QID  . bacitracin-polymyxin b   Both Eyes QHS  . chlorhexidine  15 mL Mouth/Throat BID  . darbepoetin (ARANESP) injection - DIALYSIS  150 mcg Intravenous Q Thu-HD  .  ferric gluconate (FERRLECIT/NULECIT) IV  125 mg Intravenous Weekly  . insulin aspart  1-3 Units Subcutaneous Q4H  . insulin glargine  10 Units Subcutaneous Q24H  . mupirocin cream   Topical Daily  . ofloxacin  1 drop Both Eyes TID  . pantoprazole (PROTONIX) IV  40 mg Intravenous Q24H  . sodium chloride  3 mL Intravenous Q12H  . vancomycin  1,000 mg Intravenous Q24H  . DISCONTD: piperacillin-tazobactam  3.375 g Intravenous Q6H   Continuous Infusions:   . sodium chloride 20 mL/hr at 06/21/12 2148  . sodium chloride 250 mL/hr at 06/23/12 0600  . dialysis replacement fluid (prismasate) 500 mL/hr at 06/23/12 0944  . dialysis replacement fluid (prismasate) 500 mL/hr at 06/23/12 0941  . dialysate (PRISMASATE) 1,000 mL/hr at 06/23/12 1516  . DISCONTD: norepinephrine (LEVOPHED) Adult infusion Stopped (06/20/12 2100)   PRN Meds:.fentaNYL, heparin, LORazepam, sodium chloride    BP 132/64  Pulse 79  Temp 97.5 F (36.4 C) (Oral)  Resp 21  Ht 6\' 1"  (1.854 m)  Wt 83.2 kg (183 lb 6.8 oz)  BMI 24.20 kg/m2  SpO2 99%  LMP 09/03/2008   PPS: 50% (prior to admission)   Intake/Output Summary (Last 24 hours) at 06/23/12 1616 Last data filed at 06/23/12 1500  Gross per 24 hour  Intake   5400 ml  Output   7931 ml  Net  -2531 ml   LBM:06/23/12 (stools loose)                        Physical Exam:  General: Alert, responses  appropriate HEENT: anicteric, buccal mucosa moist Chest:  Coarse scattered rhonchi throughout, decreased at bases CVS: RRR Abdomen:pendulous, soft Ext: superficial ares of skin breakdown bilaterally, several scar ares on legs and groin area Neuro:alert, oriented x 3  Labs: CBC    Component Value Date/Time   WBC 25.8* 06/23/2012 1340   RBC 3.20* 06/23/2012 1340   HGB 9.6* 06/23/2012 1340   HCT 30.1* 06/23/2012 1340   PLT 191 06/23/2012 1340   MCV 94.1 06/23/2012 1340   MCH 30.0 06/23/2012 1340   MCHC 31.9 06/23/2012 1340   RDW 19.5* 06/23/2012 1340   LYMPHSABS 1.0 06/18/2012 0200   MONOABS 1.0 06/18/2012 0200   EOSABS 0.0 06/18/2012 0200   BASOSABS 0.0 06/18/2012 0200    BMET    Component Value Date/Time   NA 142 06/23/2012 1350   K 4.4 06/23/2012 1350   CL 106 06/23/2012 1350   CO2 23 06/23/2012 1350   GLUCOSE 118* 06/23/2012 1350   BUN 12 06/23/2012 1350   CREATININE 1.32* 06/23/2012 1350   CREATININE 1.78* 10/17/2008 2000   CALCIUM 8.7 06/23/2012 1350   CALCIUM 8.3* 08/21/2011 0533   GFRNONAA 49* 06/23/2012 1350   GFRAA 57* 06/23/2012 1350    CMP     Component Value Date/Time   NA 142 06/23/2012 1350   K 4.4 06/23/2012 1350   CL 106 06/23/2012 1350   CO2 23 06/23/2012 1350   GLUCOSE 118* 06/23/2012 1350   BUN 12 06/23/2012 1350   CREATININE 1.32* 06/23/2012 1350   CREATININE 1.78* 10/17/2008 2000   CALCIUM 8.7 06/23/2012 1350   CALCIUM 8.3* 08/21/2011 0533   PROT 7.3 06/18/2012 0200   ALBUMIN 1.8* 06/23/2012 1350   AST 40* 06/18/2012 0200   ALT 18 06/18/2012 0200   ALKPHOS 211* 06/18/2012 0200   BILITOT 0.1* 06/18/2012 0200   GFRNONAA 49* 06/23/2012 1350   GFRAA 57* 06/23/2012 1350  Chest Xray Reviewed/Impressions:06/23/12 1. Worsening left upper and left lower lobe air space  consolidation and enlarging left pleural effusion.  2. Small right pleural effusion.    Time In Time Out Total Time Spent with Patient Total Overall Time  2:30p 4:00p  90 min 90 min    Greater than 50%  of this time was spent counseling and coordinating care related to the above assessment and plan.   Freddie Breech, CNS-C Palliative Medicine Team [facility] Team Phone: [phone] Pager: [phone]

## 2012-06-24 LAB — COMPREHENSIVE METABOLIC PANEL
BUN: 8 mg/dL (ref 6–23)
Calcium: 8.8 mg/dL (ref 8.4–10.5)
Creatinine, Ser: 1.2 mg/dL — ABNORMAL HIGH (ref 0.50–1.10)
GFR calc Af Amer: 64 mL/min — ABNORMAL LOW (ref >90)
Glucose, Bld: 151 mg/dL — ABNORMAL HIGH (ref 70–99)
Total Protein: 6 g/dL (ref 6.0–8.3)

## 2012-06-24 LAB — DIFFERENTIAL
Basophils Relative: 0 % (ref 0–1)
Eosinophils Absolute: 0.2 10*3/uL (ref 0.0–0.7)
Monocytes Absolute: 0.9 10*3/uL (ref 0.1–1.0)
Monocytes Relative: 4 % (ref 3–12)

## 2012-06-24 LAB — GLUCOSE, CAPILLARY
Glucose-Capillary: 177 mg/dL — ABNORMAL HIGH (ref 70–99)
Glucose-Capillary: 178 mg/dL — ABNORMAL HIGH (ref 70–99)
Glucose-Capillary: 265 mg/dL — ABNORMAL HIGH (ref 70–99)

## 2012-06-24 LAB — CBC
HCT: 30.9 % — ABNORMAL LOW (ref 36.0–46.0)
Hemoglobin: 9.6 g/dL — ABNORMAL LOW (ref 12.0–15.0)
MCH: 30 pg (ref 26.0–34.0)
MCHC: 31.1 g/dL (ref 30.0–36.0)

## 2012-06-24 MED ORDER — PAROXETINE HCL 30 MG PO TABS: 60.0000 mg | ORAL_TABLET | ORAL | Status: DC

## 2012-06-24 MED ORDER — INSULIN GLARGINE 100 UNIT/ML ~~LOC~~ SOLN
15.0000 [IU] | Freq: Every day | SUBCUTANEOUS | Status: DC
  Administered 2012-06-24: 15 [IU] via SUBCUTANEOUS

## 2012-06-24 MED ORDER — INSULIN ASPART 100 UNIT/ML ~~LOC~~ SOLN
0.0000 [IU] | Freq: Three times a day (TID) | SUBCUTANEOUS | Status: DC
  Administered 2012-06-26: 3 [IU] via SUBCUTANEOUS
  Administered 2012-06-26: 8 [IU] via SUBCUTANEOUS
  Administered 2012-06-26: 3 [IU] via SUBCUTANEOUS
  Administered 2012-06-27: 5 [IU] via SUBCUTANEOUS

## 2012-06-24 MED ORDER — PAROXETINE HCL 30 MG PO TABS: 80.0000 mg | ORAL_TABLET | ORAL | Status: DC

## 2012-06-24 MED ORDER — PENTAFLUOROPROP-TETRAFLUOROETH EX AERO: 1.0000 "application " | INHALATION_SPRAY | CUTANEOUS | Status: DC | PRN

## 2012-06-24 MED ORDER — ARIPIPRAZOLE 10 MG PO TABS
10.0000 mg | ORAL_TABLET | Freq: Every day | ORAL | Status: DC
  Administered 2012-06-24 – 2012-06-28 (×5): 10 mg via ORAL
  Filled 2012-06-24 (×5): qty 1

## 2012-06-24 MED ORDER — SODIUM CHLORIDE 0.9 % IV SOLN: 100.0000 mL | INTRAVENOUS | Status: DC | PRN

## 2012-06-24 MED ORDER — PAROXETINE HCL 30 MG PO TABS
80.0000 mg | ORAL_TABLET | ORAL | Status: DC
  Filled 2012-06-24: qty 1

## 2012-06-24 MED ORDER — NEPRO/CARBSTEADY PO LIQD
237.0000 mL | ORAL | Status: DC | PRN
  Filled 2012-06-24: qty 237

## 2012-06-24 MED ORDER — METOCLOPRAMIDE HCL 10 MG PO TABS
10.0000 mg | ORAL_TABLET | Freq: Three times a day (TID) | ORAL | Status: DC
  Administered 2012-06-24 – 2012-06-28 (×15): 10 mg via ORAL
  Filled 2012-06-24 (×21): qty 1

## 2012-06-24 MED ORDER — HEPARIN SODIUM (PORCINE) 1000 UNIT/ML DIALYSIS: 40.0000 [IU]/kg | Freq: Once | INTRAMUSCULAR | Status: DC

## 2012-06-24 MED ORDER — PAROXETINE HCL 20 MG PO TABS
80.0000 mg | ORAL_TABLET | Freq: Every day | ORAL | Status: DC
  Administered 2012-06-25 – 2012-06-28 (×4): 80 mg via ORAL
  Filled 2012-06-24 (×4): qty 4

## 2012-06-24 MED ORDER — OXYCODONE-ACETAMINOPHEN 5-325 MG PO TABS
1.0000 | ORAL_TABLET | ORAL | Status: DC | PRN
  Administered 2012-06-24 (×2): 2 via ORAL
  Administered 2012-06-24: 1 via ORAL
  Administered 2012-06-25 (×4): 2 via ORAL
  Administered 2012-06-25: 1 via ORAL
  Administered 2012-06-26 (×4): 2 via ORAL
  Administered 2012-06-27: 1 via ORAL
  Administered 2012-06-27: 2 via ORAL
  Administered 2012-06-27: 1 via ORAL
  Administered 2012-06-27 – 2012-06-28 (×5): 2 via ORAL
  Filled 2012-06-24 (×14): qty 2
  Filled 2012-06-24: qty 1
  Filled 2012-06-24 (×3): qty 2

## 2012-06-24 MED ORDER — SODIUM CHLORIDE 0.9 % IJ SOLN
10.0000 mL | Freq: Two times a day (BID) | INTRAMUSCULAR | Status: DC
  Administered 2012-06-25 – 2012-06-27 (×2): 10 mL via INTRAVENOUS

## 2012-06-24 MED ORDER — LIDOCAINE-PRILOCAINE 2.5-2.5 % EX CREA
1.0000 "application " | TOPICAL_CREAM | CUTANEOUS | Status: DC | PRN
  Filled 2012-06-24: qty 5

## 2012-06-24 MED ORDER — LIDOCAINE HCL (PF) 1 % IJ SOLN: 5.0000 mL | INTRAMUSCULAR | Status: DC | PRN

## 2012-06-24 MED ORDER — RENA-VITE PO TABS
1.0000 | ORAL_TABLET | Freq: Every day | ORAL | Status: DC
  Administered 2012-06-24 – 2012-06-25 (×2): 1 via ORAL
  Filled 2012-06-24 (×2): qty 1

## 2012-06-24 MED ORDER — NEPRO/CARBSTEADY PO LIQD
237.0000 mL | Freq: Three times a day (TID) | ORAL | Status: DC
  Administered 2012-06-25: 237 mL via ORAL
  Filled 2012-06-24 (×6): qty 237

## 2012-06-24 MED ORDER — HEPARIN SODIUM (PORCINE) 1000 UNIT/ML DIALYSIS
1000.0000 [IU] | INTRAMUSCULAR | Status: DC | PRN
  Filled 2012-06-24 (×2): qty 1

## 2012-06-24 MED ORDER — ALPRAZOLAM 0.5 MG PO TABS
0.5000 mg | ORAL_TABLET | Freq: Three times a day (TID) | ORAL | Status: DC | PRN
  Administered 2012-06-25 (×2): 0.5 mg via ORAL
  Filled 2012-06-24 (×5): qty 1

## 2012-06-24 MED ORDER — ALTEPLASE 2 MG IJ SOLR
2.0000 mg | Freq: Once | INTRAMUSCULAR | Status: AC | PRN
  Filled 2012-06-24: qty 2

## 2012-06-24 MED ORDER — PAROXETINE HCL 20 MG PO TABS: 20.0000 mg | ORAL_TABLET | Freq: Every day | ORAL | Status: DC

## 2012-06-24 MED ORDER — ALPRAZOLAM 0.5 MG PO TABS
0.5000 mg | ORAL_TABLET | Freq: Three times a day (TID) | ORAL | Status: DC | PRN
  Administered 2012-06-24: 0.5 mg via ORAL
  Administered 2012-06-24: 1 mg via ORAL
  Filled 2012-06-24: qty 2
  Filled 2012-06-24: qty 1

## 2012-06-24 NOTE — Progress Notes (Signed)
Subjective: Interval History: has complaints tired.  Objective: Vital signs in last 24 hours: Temp:  [97.3 F (36.3 C)-98.4 F (36.9 C)] 97.6 F (36.4 C) (10/28 2354) Pulse Rate:  [78-84] 79  (10/28 1300) Resp:  [14-24] 18  (10/29 0700) BP: (90-144)/(45-104) 108/60 mmHg (10/29 0700) SpO2:  [91 %-100 %] 95 % (10/29 0700) Arterial Line BP: (129-159)/(65-76) 159/76 mmHg (10/28 1400) Weight:  [79.9 kg (176 lb 2.4 oz)] 79.9 kg (176 lb 2.4 oz) (10/29 0500) Weight change: -3.3 kg (-7 lb 4.4 oz)  Intake/Output from previous day: 10/28 0701 - 10/29 0700 In: 5980 [P.O.:180; I.V.:5550; IV Piggyback:250] Out: 8729 [Stool:175] Intake/Output this shift:    General appearance: pale and slowed mentation Resp: diminished breath sounds bilaterally and rales bibasilar Cardio: S1, S2 normal and systolic murmur: systolic ejection 2/6, decrescendo at 2nd left intercostal space GI: pos bs, liver down 4 cm Extremities: edema 2 +  Lab Results:  [facility] 06/24/12 0430 06/23/12 2149  WBC 21.4* 26.2*  HGB 9.6* 9.9*  HCT 30.9* 31.0*  PLT 184 179   BMET:  Basename 06/24/12 0430 06/23/12 1350  NA 140 142  K 4.0 4.4  CL 104 106  CO2 25 23  GLUCOSE 151* 118*  BUN 8 12  CREATININE 1.20* 1.32*  CALCIUM 8.8 8.7   No results found for this basename: PTH:2 in the last 72 hours Iron Studies: No results found for this basename: IRON,TIBC,TRANSFERRIN,FERRITIN in the last 72 hours  Studies/Results: Dg Chest Port 1 View  06/23/2012  *RADIOLOGY REPORT*  Clinical Data: Follow-up atelectasis.  Shortness of breath.  PORTABLE CHEST - 1 VIEW  Comparison: 06/22/2012.  Findings: Interval extubation.  Right IJ dialysis catheter tips project over the right atrium.  Heart size stable.  Worsening left upper and left lower lobe air space consolidation with an enlarging left pleural effusion.  Small right pleural effusion.  IMPRESSION:  1.  Worsening left upper and left lower lobe air space consolidation and enlarging  left pleural effusion. 2.  Small right pleural effusion.   Original Report Authenticated By: Reyes Ivan, M.D.     I have reviewed the patient's current medications.  Assessment/Plan: 1 ESRD limited decision capability.  Wants to cont HD but does not do what it takes to survive with HD????  Vol better, solute and acid/base ok.  Could have vasc access but has refused.  Will stop CVVHD when filter out. 2 Anemia stable cont fe/epo 3 DM good control 4 Infx ?? Cont AB 5 HPTH vitD 6 Nonadherenece 7 smoking 8 PVD 9 CAD  P CVVHD, epo, fe, ab, counsel family    LOS: 7 days   Jinnie Onley L 06/24/2012,7:18 AM

## 2012-06-24 NOTE — Progress Notes (Signed)
Name: [name] MRN: [id number] DOB: 06-01-1971    LOS: 7 PCP is Ernestine Conrad, MD Referring: FPTS PULMONARY / CRITICAL CARE MEDICINE  HPI:   41 yo female admitted on 06/17/2012 for HD graft revision.  She was noted to have elevated glucose and anion gap.  She had difficulty with HD 10/23, and then developed cardiac arrest 2nd to hypovolemic shock from GI bleed and hyperkalemia 07-02-2023.  Transferred to ICU 07-02-2023, and PCCM assumed care.  She had recent tx for leg cellulitis.  Signficant PMHx ESRD on HD, CAD, DM, DVT, gastroparesis, PVD, Bipolar, HTN   Events Since Admission: Patient was on 3300 being treated for hyperglycemia coded the morning of 07-02-2023 and sent to ICU 2104. 10/25 Was placed on CRRT and continues on  Vent, Levophed drip and Insulin drip. 10/27 self extubated, EGD  showing esophagitis, gastritis and ?suction injury from OGT.   Off pressors.  Subjective/ Overnight:   Receiving CVVH dialysis Goals of care discussed with palliative care: continue dialysis as outpatient, although realization that there may have access problem in future preventing HD. No chest compressions, cardioversion or intubation.   Vital Signs: Temp:  [97.5 F (36.4 C)-98.4 F (36.9 C)] 97.7 F (36.5 C) (10/29 1200) Resp:  [14-25] 23  (10/29 1400) BP: (67-142)/(24-103) 93/48 mmHg (10/29 1400) SpO2:  [91 %-100 %] 98 % (10/29 1200) Weight:  [176 lb 2.4 oz (79.9 kg)] 176 lb 2.4 oz (79.9 kg) (10/29 0500)  Physical Examination: General:  Chronically ill appearing female, on dialysis Neuro:  Alert, awake, non focal  HEENT:   ETT tube in place mucous membranes are moist Cardiovascular: s1s2 rrr, distant, NSR  Lungs: diminished breath sounds at bases, normal work of breathing Abdomen:  Obese soft nontender midline scar noted Musculoskeletal:  Moves all extremities Skin:  Lower extremities with areas of skin breakdown noted.   Principal Problem:  *DKA (diabetic ketoacidosis) Active Problems:  Diabetic gastroparesis  ESRD on dialysis  DM (diabetes mellitus), type 2 with complications  Tobacco abuse  Hyperparathyroidism, secondary renal  Bipolar 1 disorder  Diabetic leg ulcer  Atherosclerosis of native arteries of the extremities with ulceration(440.23)  Left leg cellulitis  Acute respiratory failure with hypoxia  Shock circulatory  Cardiac arrest  Neuropathic pain of both legs  Dysphagia  Pain, lower extremity  Anxiety  Dg Chest Port 1 View  06/23/2012  *RADIOLOGY REPORT*  Clinical Data: Follow-up atelectasis.  Shortness of breath.  PORTABLE CHEST - 1 VIEW  Comparison: 06/22/2012.  Findings: Interval extubation.  Right IJ dialysis catheter tips project over the right atrium.  Heart size stable.  Worsening left upper and left lower lobe air space consolidation with an enlarging left pleural effusion.  Small right pleural effusion.  IMPRESSION:  1.  Worsening left upper and left lower lobe air space consolidation and enlarging left pleural effusion. 2.  Small right pleural effusion.   Original Report Authenticated By: Reyes Ivan, M.D.      ASSESSMENT AND PLAN  PULMONARY  Lab 06/23/12 0545 06/22/12 1500 06/22/12 0415 06/21/12 2320 06/21/12 2314 06/21/12 2054  PHART 7.417 7.508* 7.614* -- 7.609* 7.557*  PCO2ART 38.7 45.0 38.4 -- 38.6 43.9  PO2ART 61.6* 79.0* 77.0* -- 60.0* 25.0*  HCO3 24.5* 35.7* 38.7* 40.2* 38.7* --  O2SAT 91.7 97.0 97.0 52.0 94.0 --   Ventilator Settings:    ETT: 10/25>>10/27  A:  Respiratory failure s/p cardiopulmonary arrest.  P:  resolved   CARDIOVASCULAR  Lab Jul 01, 2012 1330 06/17/12 1633  TROPONINI -- --  LATICACIDVEN 4.4* --  PROBNP -- 16777.0*    Lines:  R Subclavian hemodialysis catheter (IR) 10/25>>> right IJ short triple lumen (IR) 10/25>>>  A:  Cardiac arrest Shock: likely from GI bleed P:  Transfuse as needed for bleeding off pressors 10/27   RENAL  Lab 06/24/12 0430 06/23/12 1350 06/23/12 0500 06/22/12 1630  06/22/12 0556 06/22/12 0406 06/21/12 0317 06/20/12 0345  NA 140 142 139 142 144143 -- -- --  K 4.0 4.4 -- -- -- -- -- --  CL 104 106 102 97 9695* -- -- --  CO2 25 23 26  34* 3234* -- -- --  BUN 8 12 15  26* 31*32* -- -- --  CREATININE 1.20* 1.32* 1.59* 2.07* 2.13*2.13* -- -- --  CALCIUM 8.8 8.7 8.8 8.9 8.68.7 -- -- --  MG 2.4 -- 2.1 -- -- 1.8 2.2 2.6*  PHOS 3.5 3.7 3.7 4.5 3.7 -- -- --   Intake/Output      10/28 0701 - 10/29 0700 10/29 0701 - 10/30 0700   P.O. 180 240   I.V. (mL/kg) 5550 (69.5) 1500 (18.8)   Blood     IV Piggyback 250 200   Total Intake(mL/kg) 5980 (74.8) 1940 (24.3)   Urine (mL/kg/hr)     Other 8554 2475   Stool 175    Total Output 8729 2475   Net -2749 -535         Foley:  No Foley patient is anuric.  A:   Hyperkalemia Hypervolemia Metabolic acidosis: + anion gap, likely d/t end-organ hypoperfusion -- gap closed  ESRD P:   Stop CVVHD when filter out Transition from CRRT to int HD, defer to renal - current catheter has been causing problems   GASTROINTESTINAL  Lab 06/24/12 0430 06/23/12 1350 06/23/12 0500 06/22/12 1630 06/22/12 0556 06/18/12 0200  AST 21 -- -- -- -- 40*  ALT 53* -- -- -- -- 18  ALKPHOS 175* -- -- -- -- 211*  BILITOT 0.2* -- -- -- -- 0.1*  PROT 6.0 -- -- -- -- 7.3  ALBUMIN 1.9* 1.8* 1.8* 1.8* 1.7* --    A:  GI bleed. P:   EGD 10/26 revealed esophagitis, gastritis and possible suction injury from OGT  PO protonix No further procedures - see goals of care  A: Dysphagia:  P: speech recommending dysphagia 2 diet. Patient wanting regular diet and expresses understanding of risk of aspiration with regular diet.    HEMATOLOGIC  Lab 06/24/12 0430 06/23/12 2149 06/23/12 1340 06/23/12 0500 06/22/12 2220 06/21/12 1805 06/19/12 0918  HGB 9.6* 9.9* 9.6* 9.4* 9.2* -- --  HCT 30.9* 31.0* 30.1* 29.3* 28.4* -- --  PLT 184 179 191 147* 150 -- --  INR -- -- -- -- -- 1.47 2.06*  APTT -- -- -- -- -- 45* 42*   A:  Anemia related to GI  bleeding P:  Transfuse as needed for hgb <7 Am CBC aranesp per renal   INFECTIOUS  Lab 06/24/12 0430 06/23/12 2149 06/23/12 1340 06/23/12 0500 06/22/12 2220  WBC 21.4* 26.2* 25.8* 27.1* 31.7*  PROCALCITON -- -- -- -- --   Cultures: C. difficile negative -10/23 MRSA negative by PCR 10/23  Antibiotics: Augmentin 10/23>>>10/24 Zosyn 10/24>>>10/28 Vancomycin during dialysis 10/24>>>  A:  Left lower extremity cellulitis P:   Continue vancomycin for 7 day course total (last day 10/30) Consult pharmacy to dose vancomycin while patient is on CRRT   ENDOCRINE  Lab 06/24/12 1226 06/24/12 0744 06/24/12 0426 06/23/12 2352  06/23/12 2000  GLUCAP 166* 177* 163* 206* 139*   A:  Hyperglycemia - off insulin gtt  P:   Lantus 10u, SSI Now that patient is eating regular diet, may need increase in lantus (home dose: 33u qhs)  NEUROLOGIC  A:  Acute Encephalopathy - resolved Anxiety, chronic pain P:   D/c fentanyl & ativan. Start home xanax and percocet now that able to take po.    GloBAL - Discussed with family & pt-  They are agreeable to care limitations including no CPR, no cardioversion, no more procedures or access attempts. Would work within those limits to find the best strategy to take care of her bleeding & HD issues. Palliative care involved. She understands  risk of aspiration with regular diet.   Transfer out once off CVVH. Family Medicine Teaching Service aware of transfer to 6700. PCCM to sign off  BEST PRACTICE / DISPOSITION Level of Care:  Critical Primary Service:  Critical care services Consultants:  GI, Renal, palliative Code Status: limited DVT Px:  None at this time possible GI bleed, no heparin. Lower extremities wounds no SCDs GI Px:  Protonix Social / Family:  Updated   Marena Chancy, PGY-2 Family Medicine Resident ICU Rotation  Cyril Mourning MD. FCCP. [facility] Pager [phone] If no response call [phone]

## 2012-06-24 NOTE — Progress Notes (Signed)
Patient [name]      DOB: 1971-08-01      VWU:[id number]   Palliative Medicine Team at [facility] Progress Note    Subjective: Patient alert, verbally responsive, respirations unlabored. Underwent Fibro-optic swallow evaluation  Filed Vitals:   06/24/12 1500  BP: 80/41  Pulse:   Temp:   Resp: 23   Physical exam:  General: Alert, responses appropriate  HEENT: anicteric, buccal mucosa moist  Chest: Coarse scattered rhonchi throughout, decreased at bases  CVS: RRR  Abdomen:pendulous, soft  Ext: superficial ares of skin breakdown bilaterally, several scar ares on legs and groin area  Neuro:alert, oriented x 3    Assessment and plan: Patient appears more comfortable today, had bedside fibro-optic evaluation, per SLP recommended NPO for few more days, patient requesting to eat despite aspiration risk  1) Code Status: DNR/DNI 2) Anxiety: Ativan as needed 3) Pain: Fentanyl IV as needed 4) Bowel Regime: continues to have loose stools 5) Disposition: patient wants to return home with home health and CAPS support  Time In Time Out Total Time Spent with Patient Total Overall Time  1:30p 1:50p 20 min 20 min   Freddie Breech, CNS-C Palliative Medicine Team [facility] Team Phone: [phone] Pager: [phone]

## 2012-06-24 NOTE — Progress Notes (Signed)
PT Cancellation/Discharge Note  Patient Details Name: [name] MRN: [id number] DOB: 1970/09/15   Cancelled Treatment:    Reason Eval/Treat Not Completed: Medical issues which prohibited therapy (CVVHD). Please reorder PT when off CVVHD.   Kermitt Harjo 06/24/2012, 12:50 PM

## 2012-06-24 NOTE — Procedures (Signed)
Objective Swallowing Evaluation: Fiberoptic Endoscopic Evaluation of Swallowing  Patient Details  Name: [name] MRN: [id number] Date of Birth: May 22, 1971  Today's Date: 06/24/2012 Time: 4098-1191 SLP Time Calculation (min): 45 min  Past Medical History:  Past Medical History  Diagnosis Date  . DVT (deep venous thrombosis)     DVT HISTORY  . Diabetes mellitus   . Migraine     HISTORY  . CAD (coronary artery disease)     Last cath 2009:  LAD stent 80% stenosis. Circumflex 60-70% stenosis AV groove, right coronary artery 50-60% stenosis. She did have cutting balloon angioplasty of the LAD lesion into a diagonal. However, there was restenosis of this and it was managed medically.  . Hyperlipidemia   . Hyperparathyroidism   . PE (pulmonary embolism)     HISTORY  . Dialysis patient   . Anemia   . Renal failure     dialysis [place] t,th sat  . Nephrotic syndrome   . Peripheral vascular disease   . MRSA infection   . Anxiety   . Gastroparesis   . Complication of anesthesia   . PONV (postoperative nausea and vomiting)     x3 days post anesth. on 12/19/2011  . Myocardial infarction     x3 , last one - 10 yrs. ago  . Hypertension     sees Dr. Antoine Poche - [place], last stress test 11/2011, see result in EPIC, saw Dr. Antoine Poche as well at that time    . Shortness of breath   . Recurrent upper respiratory infection (URI)     Bronchitis 11/2011 - saw Dr. Loney Hering, treating currently /w antibiotic   . GERD (gastroesophageal reflux disease)   . Pneumonia     [facility]- 2012  . Noncompliance of patient with renal dialysis   . Cellulitis Oct. 2013  . MRSA (methicillin resistant staph aureus) culture positive Oct 2013  . Anxiety and depression   . Bipolar 1 disorder   . Depression    Past Surgical History:  Past Surgical History  Procedure Date  . Incise and drain abcess     OF THIGHS FROM INSULIN INJECTIONS  . Heart stents   . Portacath placement 2003  . Cataract extraction w/phaco  11/09/2011    Procedure: CATARACT EXTRACTION PHACO AND INTRAOCULAR LENS PLACEMENT (IOC);  Surgeon: Edmon Crape, MD;  Location[facility] OR;  Service: Ophthalmology;  Laterality: Right;  . Pars plana vitrectomy 11/09/2011    Procedure: PARS PLANA VITRECTOMY WITH 23 GAUGE;  Surgeon: Edmon Crape, MD;  Location: [facility] OR;  Service: Ophthalmology;  Laterality: Right;  Ahmed Valve; Scleral Reinforcement Graft  . Dg av dialysis  shunt access exist*l* or 12/13/11    left arm  . Dialysis cath inserted & portacath      dialysis catheter inserted & portacath d/c'd- 12/19/2011  . Back surgery X 4    x4 times   . Cholecystectomy   . Eye surgery   . Cardiac catheterization   . Aorta - bilateral femoral artery bypass graft 12/28/2011    Procedure: AORTA BIFEMORAL BYPASS GRAFT;  Surgeon: Nada Libman, MD;  Location: [facility] OR;  Service: Vascular;  Laterality: N/A;  AortoBifemoral Bypass using hemasheild 14x7 graft  . Femoral-popliteal bypass graft 12/28/2011    Procedure: BYPASS GRAFT FEMORAL-POPLITEAL ARTERY;  Surgeon: Nada Libman, MD;  Location: [facility] OR;  Service: Vascular;  Laterality: Right;  right femoral popliteal bypass using 6x80 propaten graft  . Insertion of dialysis catheter 02/15/2012  Procedure: INSERTION OF DIALYSIS CATHETER;  Surgeon: Chuck Hint, MD;  Location: [facility] OR;  Service: Vascular;  Laterality: Right;  insertion of dialysis catheter right internal jugular vein,removal of left internal jugular vein dialysis catheter  . Insertion of dialysis catheter 02/21/2012    Procedure: INSERTION OF DIALYSIS CATHETER;  Surgeon: Chuck Hint, MD;  Location: [facility] OR;  Service: Cardiovascular;  Laterality: N/A;  Insertion new dialysis catheter; possible venoplasty of fibrin sheath of  Right IJ catheter  . Venogram 02/21/2012    Procedure: VENOGRAM;  Surgeon: Chuck Hint, MD;  Location: [facility] OR;  Service: Cardiovascular;  Laterality: N/A;  possible venoplasty  . Insertion of dialysis catheter  04/23/2012    Procedure: INSERTION OF DIALYSIS CATHETER;  Surgeon: Sherren Kerns, MD;  Location: [facility] OR;  Service: Vascular;  Laterality: Right;  using a 36fr. x 23 cannon catheter in Right Internal Jugular  . Esophagogastroduodenoscopy 06/21/2012    Procedure: ESOPHAGOGASTRODUODENOSCOPY (EGD);  Surgeon: Charna Elizabeth, MD;  Location: [facility] ENDOSCOPY;  Service: Endoscopy;  Laterality: N/A;  bedside   HPI:  41 yo female admitted on 06/17/2012 for HD graft revision.  She was noted to have elevated glucose and anion gap.  She had difficulty with HD 10/23, and then developed cardiac arrest 2nd to hypovolemic shock from GI bleed and hyperkalemia 10/24.  Transferred to ICU 10/24, and PCCM assumed care.  She had recent tx for leg cellulitis.  Patient extubated herself 10/27.  Patient and family report that patient has had voice disorder since previous intubation approximately 6 weeks ago.  Dysphonia, hoarsness noted.     Assessment / Plan / Recommendation Clinical Impression  Dysphagia Diagnosis: Moderate pharyngeal phase dysphagia;Severe pharyngeal phase dysphagia Clinical impression: Patient presents with a moderate-severe pharyngeal dysphagia characterized by delayed swallow initiation when combined with decreased laryngeal closure results in deep silent penetration of both liquids and solid consistencies despite relatively good pharyngeal strength. Cued throat clear weak and inconsistently effective to fully clear laryngeal vestibule. Ultimately, aspiration risk high with all pos at this time. Had a long discussion with patient regarding safest option for temporary NPO with temporary non-oral means of nutrition and repeat objective evaluation with clinician indication of improvement, vs po diet with known risk of aspiration. Palliative medicine NP present for conversation and helpful with decision making. Patient does not wish for temporary alternative means of nutrition and wishes to proceed with current po  diet with known risk of aspiration. Education complete regarding use of compensatory strategies/aspiration precautions which will be helpful in decreasing but not eliminating aspiration risk. MD, RN and sister (via phone) also made aware. SLP will f/u at bedside for continued education and potential improvements in function.     Treatment Recommendation  Therapy as outlined in treatment plan below    Diet Recommendation Dysphagia 2 (Fine chop);Nectar-thick liquid   Liquid Administration via: Cup;No straw Medication Administration: Whole meds with puree Supervision: Full supervision/cueing for compensatory strategies Compensations: Slow rate;Small sips/bites (clear throat and dry swallow with each bite and sip) Postural Changes and/or Swallow Maneuvers: Seated upright 90 degrees    Other  Recommendations Oral Care Recommendations: Oral care BID Other Recommendations: Order thickener from pharmacy;Prohibited food (jello, ice cream, thin soups);Remove water pitcher   Follow Up Recommendations  Other (comment) (TBD)    Frequency and Duration min 3x week  2 weeks       SLP Swallow Goals Patient will utilize recommended strategies during swallow to increase swallowing safety with: Supervision/safety Swallow Study  Goal #2 - Progress: Not met   General HPI: 41 yo female admitted on 06/17/2012 for HD graft revision.  She was noted to have elevated glucose and anion gap.  She had difficulty with HD 10/23, and then developed cardiac arrest 2nd to hypovolemic shock from GI bleed and hyperkalemia 10/24.  Transferred to ICU 10/24, and PCCM assumed care.  She had recent tx for leg cellulitis.  Patient extubated herself 10/27.  Patient and family report that patient has had voice disorder since previous intubation approximately 6 weeks ago.  Dysphonia, hoarsness noted. Type of Study: Fiberoptic Endoscopic Evaluation of Swallowing Reason for Referral: Objectively evaluate swallowing function Previous  Swallow Assessment: 04/22/12 BSE Dys. 1 with thin liquids. Diet Prior to this Study: Dysphagia 2 (chopped);Nectar-thick liquids Temperature Spikes Noted: No Respiratory Status: Supplemental O2 delivered via (comment) (nasal cannula) History of Recent Intubation: Yes Length of Intubations (days): 2 days Date extubated: 06/22/12 Behavior/Cognition: Alert;Cooperative Oral Cavity - Dentition: Edentulous;Dentures, not available Oral Motor / Sensory Function: Impaired - see Bedside swallow eval Self-Feeding Abilities: Able to feed self Patient Positioning: Upright in bed Baseline Vocal Quality: Wet;Hoarse;Breathy Volitional Cough: Weak Volitional Swallow: Able to elicit Anatomy: Other (Comment) (decreased vocal cord adduction) Pharyngeal Secretions: Normal    Reason for Referral Objectively evaluate swallowing function   Oral Phase Oral Preparation/Oral Phase Oral Phase: WFL   Pharyngeal Phase Pharyngeal Phase Pharyngeal Phase: Impaired Pharyngeal - Honey Pharyngeal - Honey Teaspoon: Delayed swallow initiation;Premature spillage to valleculae;Reduced airway/laryngeal closure;Penetration/Aspiration during swallow Penetration/Aspiration details (honey teaspoon): Material enters airway, CONTACTS cords and not ejected out Pharyngeal - Nectar Pharyngeal - Nectar Teaspoon: Delayed swallow initiation;Premature spillage to valleculae;Reduced airway/laryngeal closure;Penetration/Aspiration during swallow Penetration/Aspiration details (nectar teaspoon): Material enters airway, CONTACTS cords and not ejected out Pharyngeal - Nectar Cup: Delayed swallow initiation;Premature spillage to pyriform sinuses Pharyngeal - Solids Pharyngeal - Puree: Delayed swallow initiation;Premature spillage to valleculae;Reduced airway/laryngeal closure;Penetration/Aspiration during swallow Penetration/Aspiration details (puree): Material enters airway, CONTACTS cords and not ejected out  Cervical Esophageal  Phase    GO Ferdinand Lango MA, CCC-SLP [phone]    Cervical Esophageal Phase Cervical Esophageal Phase: [facility]         Anyssa Sharpless Meryl 06/24/2012, 2:56 PM

## 2012-06-24 NOTE — Progress Notes (Signed)
1530 CRRT stopped.   1630 Lupita Leash (sister) called, and notified patient off CRRT and transfer to [facility].  1700 Report called to receiving nurse.  Past medical history and present hospital course reported to receiving nurse.   1715 Patient transferred to 6704 (room change), all belongings transferred with patient via bed.  All questions answered.

## 2012-06-24 NOTE — Progress Notes (Addendum)
FPTS PGY-1 Interim Note  Pt seen at bedside this after transfer out of ICU back to FPTS. Multiple family members in room. Of note, pt is much more communicative tonight than she has been recently, and appears to be more actively engaged with making decisions about her own health and care. Pt states that she is having a lot of pain all over. She also states, "I changed my mind, I don't want to die" and wishes to be full code status again. Long discussion with pt and family about prognosis and plans going forward. Pt states she wants to "stay here" because she feels safe here, and she does not want to go to a hospice facility or to a nursing home; pt does understand that she has limited mobility (states herself she doesn't have the strength to get up to go to the bathroom) and that if she is unable to toilet herself and so on while she's here, that she'd be even less likely to be able to do so at home. I also addressed with pt and family that we will continue to manage her diabetes, blood pressure, and ESRD as appropriate, as long as she is still in the hospital and desires full medical management of chronic illnesses.  1. Code status/capacity/dispo - Pt states she wishes to be full code again. Pt and family understand that HD access may be a problem going forward (pt's current catheter may not be functioning well and pt may not be able to have more access placed due to multiple vascular procedures in the past and generally unfavorable vasculature for continued procedures) and if pt is unable to resume hemodialysis, inpt or outpt, that hospice care would be basically the next/last step. Plan - Will continue to address code status and readdress with palliative care tomorrow. Will put in orders for full code, in the meantime. Pt also has a pending psychiatric evaluation for capacity; in the event that pt is deemed incompetent, pt's mother is HCPOA. I currently believe that at least at the moment, pt has capacity  to make these decisions, but that she is not making the wisest choices. Will also involve CSW tomorrow for discussion about placement.  2. DM - Will resume Lantus and Novolog SSI with regular CBG checks. Lantus 15 ordered for tonight (half pt's home dose), with moderate SS Novolog. I expect this will need to be increased as pt's PO intake improves.  3. ESRD - Pt with currently questionably-functioning access. Will defer to nephrology management in the meantime; pt is normally on a T Th S schedule, and presumably will not go back on HD until the day after tomorrow. FPTS will follow-up with renal service, tomorrow. Assistance is greatly appreciated.  4. GI bleeding - GI has signed off but notes reflect that pt would likely benefit from colonoscopy but has not been stable enough. Of note, pt received 8 units of RBC's while in the ICU. Pt has not noticed any further bleeding in stool but does complain of continued diarrhea. Pt has been on IV abx for several days. Will recheck C.diff. If any further evidence of bleeding, will readdress need for further transfusion.  5. Skin care - Local care per RN/unit routine. Pt with sores to buttocks.  6. Otherwise, will re-order home medicines from before transfer to ICU (Paxil, Abilify; will hold off on Plavix, in case of further procedures). Will also continue vancomycin and Zosyn per pharmacy, and will address how much longer (if any longer at all) pt  will need to be on abx for cellulitis, tomorrow.  I would like to personally reiterate how FPTS greatly appreciates all the assistance from palliative care, social work/case management, nephrology, GI, and CCM in these difficult situations.  Addendum: Please note on review of current orders, pt has active CRRT-related orders. Pt is no longer on CRRT since she has moved out of the ICU. I WILL NOT CANCEL ANY OF THESE ORDERS AND DEFER TO NEPHROLOGY MANAGEMENT AS I AM NOT FAMILIAR WITH THESE ORDERS AND DO NOT WANT TO ALTER  ANY OF THEM INAPPROPRIATELY.  Bobbye Morton, MD PGY-1, [facility] FPTS Intern pager: [phone]

## 2012-06-25 ENCOUNTER — Inpatient Hospital Stay (HOSPITAL_COMMUNITY): Payer: Medicare Other

## 2012-06-25 LAB — RENAL FUNCTION PANEL
BUN: 18 mg/dL (ref 6–23)
CO2: 19 mEq/L (ref 19–32)
Calcium: 9.1 mg/dL (ref 8.4–10.5)
GFR calc Af Amer: 28 mL/min — ABNORMAL LOW (ref >90)
Glucose, Bld: 192 mg/dL — ABNORMAL HIGH (ref 70–99)
Potassium: 4.4 mEq/L (ref 3.5–5.1)
Sodium: 132 mEq/L — ABNORMAL LOW (ref 135–145)

## 2012-06-25 LAB — CBC
Platelets: 180 10*3/uL (ref 150–400)
RBC: 3.04 MIL/uL — ABNORMAL LOW (ref 3.87–5.11)
WBC: 17.4 10*3/uL — ABNORMAL HIGH (ref 4.0–10.5)

## 2012-06-25 LAB — CLOSTRIDIUM DIFFICILE BY PCR: Toxigenic C. Difficile by PCR: NEGATIVE

## 2012-06-25 LAB — GLUCOSE, CAPILLARY
Glucose-Capillary: 117 mg/dL — ABNORMAL HIGH (ref 70–99)
Glucose-Capillary: 277 mg/dL — ABNORMAL HIGH (ref 70–99)
Glucose-Capillary: 243 mg/dL — ABNORMAL HIGH (ref 70–99)

## 2012-06-25 LAB — MAGNESIUM: Magnesium: 2.2 mg/dL (ref 1.5–2.5)

## 2012-06-25 MED ORDER — RENA-VITE PO TABS
1.0000 | ORAL_TABLET | Freq: Every day | ORAL | Status: DC
  Administered 2012-06-25 – 2012-06-27 (×3): 1 via ORAL
  Filled 2012-06-25 (×4): qty 1

## 2012-06-25 MED ORDER — ALTEPLASE 2 MG IJ SOLR
2.0000 mg | Freq: Once | INTRAMUSCULAR | Status: DC
  Filled 2012-06-25: qty 2

## 2012-06-25 MED ORDER — ALTEPLASE 100 MG IV SOLR
4.0000 mg | Freq: Once | INTRAVENOUS | Status: DC
  Filled 2012-06-25: qty 4

## 2012-06-25 MED ORDER — LIDOCAINE-PRILOCAINE 2.5-2.5 % EX CREA: 1.0000 "application " | TOPICAL_CREAM | CUTANEOUS | Status: DC | PRN

## 2012-06-25 MED ORDER — HEPARIN SODIUM (PORCINE) 1000 UNIT/ML DIALYSIS
40.0000 [IU]/kg | Freq: Once | INTRAMUSCULAR | Status: DC
  Filled 2012-06-25: qty 4

## 2012-06-25 MED ORDER — SODIUM CHLORIDE 0.9 % IJ SOLN
10.0000 mL | INTRAMUSCULAR | Status: DC | PRN
  Administered 2012-06-25 – 2012-06-26 (×2): 10 mL

## 2012-06-25 MED ORDER — LIDOCAINE HCL (PF) 1 % IJ SOLN: 5.0000 mL | INTRAMUSCULAR | Status: DC | PRN

## 2012-06-25 MED ORDER — HEPARIN SODIUM (PORCINE) 1000 UNIT/ML DIALYSIS
1000.0000 [IU] | INTRAMUSCULAR | Status: DC | PRN
  Filled 2012-06-25: qty 1

## 2012-06-25 MED ORDER — SODIUM CHLORIDE 0.9 % IV SOLN: 100.0000 mL | INTRAVENOUS | Status: DC | PRN

## 2012-06-25 MED ORDER — NEPRO/CARBSTEADY PO LIQD
237.0000 mL | Freq: Three times a day (TID) | ORAL | Status: DC
  Administered 2012-06-25 – 2012-06-28 (×10): 237 mL via ORAL

## 2012-06-25 MED ORDER — SODIUM CHLORIDE 0.9 % IV SOLN
125.0000 mg | INTRAVENOUS | Status: DC
  Filled 2012-06-25: qty 10

## 2012-06-25 MED ORDER — HEPARIN SODIUM (PORCINE) 1000 UNIT/ML DIALYSIS
1000.0000 [IU] | INTRAMUSCULAR | Status: DC | PRN
  Filled 2012-06-25: qty 6

## 2012-06-25 MED ORDER — OXYCODONE-ACETAMINOPHEN 5-325 MG PO TABS
ORAL_TABLET | ORAL | Status: AC
  Filled 2012-06-25: qty 2

## 2012-06-25 MED ORDER — DARBEPOETIN ALFA-POLYSORBATE 150 MCG/0.3ML IJ SOLN
150.0000 ug | INTRAMUSCULAR | Status: DC
  Filled 2012-06-25: qty 0.3

## 2012-06-25 MED ORDER — INSULIN GLARGINE 100 UNIT/ML ~~LOC~~ SOLN
25.0000 [IU] | Freq: Every day | SUBCUTANEOUS | Status: DC
  Administered 2012-06-25: 25 [IU] via SUBCUTANEOUS

## 2012-06-25 MED ORDER — PENTAFLUOROPROP-TETRAFLUOROETH EX AERO: 1.0000 "application " | INHALATION_SPRAY | CUTANEOUS | Status: DC | PRN

## 2012-06-25 MED ORDER — ALTEPLASE 2 MG IJ SOLR
2.0000 mg | Freq: Once | INTRAMUSCULAR | Status: AC | PRN
  Filled 2012-06-25: qty 2

## 2012-06-25 MED ORDER — NEPRO/CARBSTEADY PO LIQD: 237.0000 mL | ORAL | Status: DC | PRN

## 2012-06-25 MED ORDER — SODIUM CHLORIDE 0.9 % IJ SOLN: 10.0000 mL | Freq: Two times a day (BID) | INTRAMUSCULAR | Status: DC

## 2012-06-25 NOTE — Progress Notes (Signed)
Patient ID: [name], female   DOB: [birth year], 41 y.o.   MRN: [id number] Will not follow formally at this time, but support family.

## 2012-06-25 NOTE — Progress Notes (Signed)
Speech Language Pathology Dysphagia Treatment Patient Details Name: [name] MRN: [id number] DOB: 04-May-1971 Today's Date: 06/25/2012 Time: 1130-1150 SLP Time Calculation (min): 20 min  Assessment / Plan / Recommendation Clinical Impression  Patient seen for f/u diet tolerance assessment after FEES 10/29. Upon entering room, patient noted to be using call button to request ice chips.  Reinforced diet recommendations (dysphagia 2, nectar thick) agreed upon by patient with known aspiration risk following FEES. Patient continued to request ice chips, no appearing to be able to cognitively reason through entire situation, despite max SLP education. Patient also noted to be requesting "cucumbers and vinegar" which are also not within compliance of previously  agreed upon diet. After much conversation, patient verbalized that she would like to resume a regular diet, thin liquid, with known aspiration risk. She was able to verbalize use of compensatory strategies taught yesterday to decrease, but no eliminate, aspiration risk. Did not observe with pos today as patient only requesting ice or water. Although at times, patient appears to understand information being taught, to this SLP, she does not appear to be able to reason through the complexity of the decision. Should be noted that additionally, patient told this SLP that she was "going to dialysis thurs" despite chart review seeming to convey that patient had refused HD all together. Discussed with palliative care NP.  Would appreciate assistance in decision making regarding current plan including diet recommendations. Question if psych. Consult would be appropriate to ensure that patient has decision making capacity.     Diet Recommendation  Continue with Current Diet: Dysphagia 2 (fine chop);Nectar-thick liquid    SLP Plan Continue with current plan of care   Pertinent Vitals/Pain n/a   Swallowing Goals  SLP Swallowing Goals Patient will  consume recommended diet without observed clinical signs of aspiration with: Moderate assistance Swallow Study Goal #1 - Progress: Discontinued (comment) (due to decision for pos with known risk) Patient will utilize recommended strategies during swallow to increase swallowing safety with: Supervision/safety Swallow Study Goal #2 - Progress: Progressing toward goal  General Temperature Spikes Noted: No Respiratory Status: Supplemental O2 delivered via (comment) Behavior/Cognition: Alert;Cooperative Oral Cavity - Dentition: Edentulous;Dentures, not available Patient Positioning: Upright in chair     Dysphagia Treatment Treatment focused on: Patient/family/caregiver education Family/Caregiver Educated: aunt Patient observed directly with PO's: No Reason PO's not observed:  (see clinical impression statement) Pharyngeal Phase Signs & Symptoms: Wet vocal quality   GO   Ferdinand Lango MA, CCC-SLP [phone]   [name] Meryl 06/25/2012, 12:16 PM

## 2012-06-25 NOTE — Progress Notes (Signed)
Physical Therapy Evaluation Patient Details Name: [name] MRN: [id number] DOB: [birth year] Today's Date: 06/25/2012 Time: 8295-6213 PT Time Calculation (min): 20 min  PT Assessment / Plan / Recommendation Clinical Impression  41 yo admitted with sepsis and cellultitis, GIB, coded with CPR on 24-Jun-2023, received 8 units of blood, was on CVVHD, transferred to 6700 on 10/28; Presents with decr functional mobility, decr activity tol; Will benefit from acute PT to work on advancing transfers and ambulation, increase independence and decr burden of care; Familiar to PT from previous admissions, and has 24 hour care at home, however family expressing burnout/difficulty caring for [name], who needs 24 hour maximal assistance;   Post acute rehab is indicated (SNF or Comprehensive Inpt Rehab) to work t regain strength and decr burden of care once pt returns home; Noted also that pt has quite a complex medical history and course, and [name] and her family are facing difficult decisions re: her care at this time  10:44 am: Noted Dr. Deterding's most recent note, with [name]'s wish to stop HD -- this changes PT's role in her care, and rules out Comprehensive Inpt Rehab; Will plan to check in with pt and family likely one more time, and then will likely dc PT services (also will take Palliative Care Team's lead); considering this information, Home will be the likely choice for dispo; Will need to consider ambulance transport home, otherwise pt is pretty well-equipped    PT Assessment  Patient needs continued PT services    Follow Up Recommendations  Post acute inpatient    Does the patient have the potential to tolerate intense rehabilitation   Yes, Recommend IP Rehab Screening (maybe; worth considering options) Above is not indicated with most recent decision to stop HD  Barriers to Discharge   has 24 hour assist, but Family is unsure of their ability to care for [name]      Public relations account executive (air overlay for hospital bed) Ambulance transport home   Recommendations for Other Services OT consult;Rehab consult   Frequency Min 3X/week    Precautions / Restrictions Precautions Precautions: Fall   Pertinent Vitals/Pain Pain all over; did not rate      Mobility  Bed Mobility Bed Mobility: Rolling Right;Right Sidelying to Sit Rolling Right: 4: Min assist;With rail Right Sidelying to Sit: 3: Mod assist;With rails Details for Bed Mobility Assistance: Requiring heavy mod assist for elevating trunk from bed and manual facilitation at shoulder and pelvic girdles Transfers Transfers: Sit to Stand;Stand to Sit Sit to Stand: 3: Mod assist;From bed (Pt=50%) Stand to Sit: 3: Mod assist;To chair/3-in-1;With upper extremity assist (Pt=55%) Details for Transfer Assistance: Generaly weak with first time up in many days; Required bil knee block for safety secondary to LE weakness leading to instability in standing; cues for safety, hand palcement, and to initiate with anterior weight shift; Good use of UEs on armrests to control descent to chair Ambulation/Gait Ambulation/Gait Assistance: 2: Max assist Ambulation Distance (Feet): 2 Feet (3 pivot steps bed to recliner) Assistive device: Other (Comment) (1 person assist; hand contact at gait belt) Ambulation/Gait Assistance Details: Very close guard of knees in case of buckle; Pt was able t identify need to step and weight shift accordingly for foot advancement R and L    Shoulder Instructions     Exercises     PT Diagnosis: Generalized weakness;Difficulty walking  PT Problem List: Decreased strength;Decreased activity tolerance;Decreased balance;Decreased mobility;Decreased coordination;Pain PT Treatment Interventions: DME instruction;Functional  mobility training;Therapeutic activities;Therapeutic exercise;Patient/family education;Gait training   PT Goals Acute Rehab PT Goals Time For Goal  Achievement: 07/09/12 Potential to Achieve Goals: Fair Pt will go Supine/Side to Sit: with supervision;with HOB 0 degrees PT Goal: Supine/Side to Sit - Progress: Goal set today Pt will go Sit to Supine/Side: with supervision;with HOB 0 degrees PT Goal: Sit to Supine/Side - Progress: Goal set today Pt will go Sit to Stand: with min assist PT Goal: Sit to Stand - Progress: Goal set today Pt will go Stand to Sit: with min assist PT Goal: Stand to Sit - Progress: Goal set today Pt will Transfer Bed to Chair/Chair to Bed: with min assist PT Transfer Goal: Bed to Chair/Chair to Bed - Progress: Goal set today Pt will Ambulate: 51 - 150 feet;with supervision;with rolling walker PT Goal: Ambulate - Progress: Goal set today  Visit Information  Last PT Received On: 06/25/12 Assistance Needed: +1 (+2 for safety with initial amb)    Subjective Data  Subjective: Wanting OOB today Patient Stated Goal: Not stated in this session, but in MD noted pt's wish to go home is clearly stated   Prior Functioning  Home Living Lives With: Family Available Help at Discharge: Family;Available 24 hours/day;Personal care attendant (aide 26 hours/week) Type of Home: House Home Access: Ramped entrance Home Layout: One level Bathroom Shower/Tub: Engineer, manufacturing systems: Standard Home Adaptive Equipment:  (lift chair, recently got a hospital bed (needs to be verifie) Additional Comments: Family has to lift pt legs over tub for her to step into tub to sit on seat for bathing. Pt has not been sleeping in hospital bed due to mattress too firm and uses gel cushion and sleeps in lift chair; Family reports they are quite taxed in providing care for [name] Prior Function Level of Independence: Needs assistance Needs Assistance: Light Housekeeping;Meal Prep;Transfers;Gait;Dressing;Bathing;Toileting Bath: Maximal Dressing: Maximal Toileting: Maximal Meal Prep: Total Light Housekeeping: Total Gait Assistance: mod  assist with RW limited distances grossly 15' but not ambulating for the last few weeks Transfer Assistance: min assist into tub, pt can transfer from lift chair to [facility] on her own, assist for transfer from [facility] grossly min assist Able to Take Stairs?: No Driving: No Vocation: On disability Communication Communication: No difficulties    Cognition  Overall Cognitive Status: Impaired Area of Impairment: Safety/judgement Arousal/Alertness: Awake/alert Orientation Level: Appears intact for tasks assessed Behavior During Session: [facility] for tasks performed Safety/Judgement: Decreased awareness of need for assistance Safety/Judgement - Other Comments: Pt continues to want to go home; Seems unaware of burden of care for mother and sisters; Also demonstrating lack of understanding of medical status, declining HD despite MD's best recommendations Cognition - Other Comments: Pt demonstrates decreased participation and realization of work involved to truly increase function and return to amb    Extremity/Trunk Assessment Right Upper Extremity Assessment RUE ROM/Strength/Tone: Deficits;Due to pain RUE ROM/Strength/Tone Deficits: Slow to move UEs, stating is painful Left Upper Extremity Assessment LUE ROM/Strength/Tone: Deficits;Due to pain LUE ROM/Strength/Tone Deficits: Slow to move UEs stating is painful Right Lower Extremity Assessment RLE ROM/Strength/Tone: Deficits RLE ROM/Strength/Tone Deficits: Generally weak, requiring physical assist and knee block for sit to stand Left Lower Extremity Assessment LLE ROM/Strength/Tone: Deficits LLE ROM/Strength/Tone Deficits: Generally weak, requiring physical assist and knee block for sit to stand   Balance Static Sitting Balance Static Sitting - Balance Support: Right upper extremity supported;Left upper extremity supported;Feet supported Static Sitting - Level of Assistance: 5: Stand by assistance Static Sitting - Comment/#  of Minutes: 3  End of Session PT  - End of Session Equipment Utilized During Treatment: Gait belt Activity Tolerance: Patient limited by fatigue Patient left: in chair;with nursing in room;with family/visitor present (nephrologist discussiong options with pt) Nurse Communication: Mobility status;Other (comment) (in room during transfer)  GP     Van Clines [facility] [place], [place] [phone]  06/25/2012, 10:44 AM

## 2012-06-25 NOTE — Progress Notes (Signed)
Patient scheduled to have hemodialysis today. Arrived to patient room and patient has informed me that she will not be going for her HD treatment. Patient was strongly urged to adhere to MD orders and reminded of the consequences of not receiving dialysis. Patients family member has also encouraged her toward receiving her HD treatment. Patient informed me that she will not be coming to hemodialysis today. The nephologist will be informed of this mornings events

## 2012-06-25 NOTE — Progress Notes (Signed)
OT Cancellation Note  Patient Details Name: [name] MRN: [id number] DOB: 30-Jul-1971   Cancelled Treatment:     Pt with MD discussing POC. Will attempt this pm.  [facility] Nelly Scriven, OTR/L  [phone] 06/25/2012 06/25/2012, 12:57 PM

## 2012-06-25 NOTE — Progress Notes (Signed)
Rehab Admissions Coordinator Note:  Patient was screened by Roseanna Rainbow  for appropriateness for an Inpatient Acute Rehab Consult.  At this time, we are recommending home with home health/hospice services.   Meryl Dare 06/25/2012, 12:10 PM  I can be reached at [phone]

## 2012-06-25 NOTE — Progress Notes (Addendum)
PGY-1 Update Note  Spoke with pt and pt's aunt to discuss hospice options. Upon clarification, pt meant this morning that she wanted to not have HD TODAY, not that she wanted to stop HD altogether. Will clarify with Dr. Darrick Penna what plans will be for possible HD tomorrow and/or other plans for today. Will also clarify with palliative care.  Pt also complaining of dysphagia 2 diet. Will speak with SLP.  Bobbye Morton, MD PGY-1, [facility] Family Medicine FPTS Intern pager: [phone]

## 2012-06-25 NOTE — Procedures (Signed)
I was present at this session.  I have reviewed the session itself and made appropriate changes.  HD via cath.  Has decided to cont HD  Tyrone Pautsch L 10/30/20133:08 PM

## 2012-06-25 NOTE — Progress Notes (Signed)
Family Medicine Teaching Service Daily Progress Note Intern Pager: [phone]  Patient name: [name] Medical record number: [id number] Date of birth: 1971-06-02 Age: 41 y.o. Gender: female  Primary Care Provider: Ernestine Conrad, MD  Subjective: Pt seen at bedside this morning. Complains of back and leg pain "like normal" and also that nursing has been "slow" giving her medicines. Also states she does not want to go on HD today until she talks to a kidney doctor, because she is 'supposed to go back on her normal T Th S schedule.' Otherwise feels okay with no increased work of breathing, no chest pain/abdominal pain, or nausea. Pt understands importance of dialysis but reiterates she wishes to speak to nephrologist, first.  Objective: Temp:  [97.7 F (36.5 C)-98.3 F (36.8 C)] 98.1 F (36.7 C) (10/30 0751) Pulse Rate:  [89-94] 93  (10/30 0751) Resp:  [14-25] 18  (10/30 0751) BP: (67-115)/(41-76) 109/65 mmHg (10/30 0751) SpO2:  [96 %-100 %] 96 % (10/30 0751) Exam: General: obese adult female, in NAD and more cooperative with exam this morning than previous days Cardiovascular: RRR, normal S1, S2; heart sounds slightly distant secondary to body habitus  Respiratory: CTAB, rare expiratory wheeze  Abdomen: soft, nontender, obese; large midline scar; ~2 cm soft, easily reducible incisional hernia; BS+ Extremities: LE edema markedly improved, bilaterally; skin with chronic venous stasis changes Skin: RLE with two areas of chronic ulceration, one just below/lateral to knee, one above ankle  Bilateral relatively poorly healed areas of groin at previous bypass graft sites  Present on admission: Buttock ulcerations, healing with skin barrier cream in place  LLE redness/warmth improved  Laboratory:  Lab 06/24/12 0430 06/23/12 2149 06/23/12 1340  WBC 21.4* 26.2* 25.8*  HGB 9.6* 9.9* 9.6*  HCT 30.9* 31.0* 30.1*  PLT 184 179 191    Lab 06/24/12 0430 06/23/12 1350 06/23/12 0500  NA 140 142  139  K 4.0 4.4 3.9  CL 104 106 102  CO2 25 23 26   BUN 8 12 15   CREATININE 1.20* 1.32* 1.59*  CALCIUM 8.8 8.7 8.8  PROT 6.0 -- --  BILITOT 0.2* -- --  ALKPHOS 175* -- --  ALT 53* -- --  AST 21 -- --  GLUCOSE 151* 118* 153*    Basename 06/24/12 2154 06/24/12 1956 06/24/12 1734  GLUCAP 324* 265* 178*   CXR, 10/22 @1132  Findings: Cardiomegaly is noted. Stable dual lumen right IJ  dialysis catheter position. Central mild vascular congestion.  Mild interstitial prominence bilaterally. Mild interstitial edema  cannot be excluded. No focal infiltrate.  IMPRESSION:  Stable dual lumen right IJ dialysis catheter position. Central  mild vascular congestion. Mild interstitial prominence  bilaterally. Mild interstitial edema cannot be excluded. No focal  infiltrate.  Assessment and Plan: NIKISHIA MCMORRIS is a 41 y.o. year old female with a complicated PMH significant for multiple vascular procedures, generally poorly controlled DM type II also on insulin, ESRD on HD (TThS in [place], [place]), CAD/previous MI s/p stenting, presenting with elevated blood sugars after coming to the hospital for planned HD catheter change and pre-treatment with prednisone for contrast allergy. Found with hyperglycemia suggestive of DKA vs hyperosmolarity with CBG >600, anion gap 22, WBC 20.9, BP ~180's-190's/90's-100's. Pt coded 06/22/23, requiring transfer to ICU; multifactorial including elevated K and Hb drop presumed to be GI bleed. Transfused 8 units while in the ICU, no further evidence of bleeding since 10/28. Transferred back to regular floor 10/29.   1. DM type II with multiple complications -  Poor compliance with outpt insulin/CBG regimen. Presenting with CBG >600 and gap 22, as above. Pt on Lantus 33 units qHS, Humalog SSI TID at home, with Reglan for gastroparesis. Last A1c 6.9 in July. Multiple chronic poorly-healing wounds: right LE ulcers, groin incisions from previous bypass surgery, multiple buttocks ulcers.  CBG's reasonably well controlled through admission and while in ICU. Plan - Back on Lantus and SSI. Will watch CBG's today and readjust dosing as needed, now that pt is taking PO again.  2. ESRD on HD - On TThS schedule in [place], [place]. Baseline Cr appears to be ~3.5-5. Hx of multiple graft/catheter placements. Most recently/currently has right IJ catheter, but was to be changed out 10/22; did not go to dialysis; due to scheduled change of catheter. Some fluid overload on exam at presentation with 2+ pitting edema of bilateral lower legs; some crackles on lung exam but likely component of dependence as pt favors lying on her right side. Pt s/p ICU stay after code on 10/24, with catheter changed a second time during ICU stay; pt was on CRRT during ICU stay as well. Question of current access function and difficulty of gaining new access if this access fails. Pt also refusing HD this morning "until she can talk to the kidney doctor." Plan - HD management per nephrology service. Assistance greatly appreciated. If unable to continue intermittent HD, hospice care may be the next step. See dispo, below.  3. HTN - Pt hypertensive in the ED to 200's/100's. Per pt's sister, pt has been taken off "a few different drugs" earlier this year by a doctor during one hospital admission. During previous admission to our service 10/10, pt was intially hypotensive and then normotensive after gentle hydration and subsequent dialysis, not requiring medication. BP's improved after dialysis/CRRT.  Plan - Continue hydralazine PRN for systolic <160, diastolic <100.   4. Cellulitis of LLE +/- chronic venous stasis - Healed/healing from previous admission with course of abx not yet complete, also with likely component of chronic venous stasis. At time of admission, was nearly circumferential from ankle almost to knee but does not appear as extensive as during previous admission. Appears improved, though pt still with elevated WBC as of  10/29. Pt afebrile. Plan - Abx completed. Will monitor clinically for further s/s of infection.  5. Skin care - Buttock ulcers resent on admission initially concerning for pressure ulcers of uncertain stage given amount of time pt spends in bed/wheelchair. 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 Wounds in various stages of healing, barrier cream in place. Plan - Bactroban with local wound care. Foam dressing and air mattress to reduce pressure; appreciate wound care consult. Turn patient q2 hours.   6. CAD/PAD with history of MI - S/p stent placement in 2003; cath in 2009 showed some occlusion of stent with angioplasty and subsequent medically managed restenosis. Aortobifemoral graft and above-the-knee femoral-popliteal bypass on the right leg in May 2013. Echo in 01/2012 showed EF of 60-65% with normal systolic function and mild LVH.  Plan - Holding home Plavix for now, given possibility for needing procedure for HD access, again.   7. Bipolar disorder/anxiety/chronic pain - Continue home Paxil, Abilify. Xanax PRN (half home dose). Percocet PRN (half home dose).   8. GERD - Protonix to replace home omeprazole. Maalox PRN.   9. Hx multiple eye surgeries - Continue home eyedrops (Polysporin, ofloxacin).   10. Hx  of migraines - No current headaches. Fioricet PRN; currently holding, to likely be restarted at discharge.  11. Seasonal allergies - Continue home Flonase at d/c.   #Apparent Somnolence  - Slept/snored thorugh majority of time in ED, easily awakened. Similar behavior/appearance to previous admission; pt is often very indifferent to her current status and to questioning and is sometimes very uncooperative with exam. Pt also refusing multiple meds, which also occurred during previous admission. Pt has been more alert/interactive but still occasionally refusing therapies, as above.  #History of OSA - Adamantly  refuses CPAP. CO2 monitored in ED and not elevated as expected. Patient is at high risk for becoming hypercarbic so will hold off on medications that could cause respiratory depression or smooth muscle relaxation; reduced home Percocet and Xanax doses.  FEN/GI: Dysphagia 3 diet, Nepro supplements with meals Prophylaxis/PRN: SubQ heparin/anticoagulation per HD protocols. Disposition: Pt has had palliative care consult and was DNR/DNI but has now requested to be full code again; will discuss with their team again. Psych consult pending for capacity evaluation. Pt also not cooperating with HD, as above; will defer to nephro. PT/OT consults requested and CSW also involved, assistance appreciated for placement considerations; pt is generally refusing SNF/hospice facility, but things have been difficult and constantly changing. See my note from 10/29 PM. Code Status: Full  [name], [name] Deer, MD 06/25/2012, 9:41 AM

## 2012-06-25 NOTE — Progress Notes (Signed)
PGY-1 Update Note  Spoke with pt and family again. I presented various options for moving forward in regards to both HD and placement concerns. Option 1: Continue HD intermittently (and as an outpt, once discharged), and go home with relevant home-health.  Concerns for this option: Pt's family state they feel unable to adequately care for and monitor pt. Also, given pt's non-compliance and general discomfort with HD in the past, I feel that HD will only continue to be more difficult and ultimately will prolong the inevitable (future problems with access, concerns for future non-compliance despite pt's claims that "I will do it when I'm supposed to, and I won't stop early," and so on, and eventually death, regardless). I reiterated that with access difficulties, all of these discussions for continuing dialysis may be academic, regardless, if pt's access malfunctions today or whenever HD is able to be scheduled again.  Option 2: Continue HD intermittently and go to a SNF.  Concerns for this option: Pt continues to state that while she is "not ready to die" and wishes to continue HD, she does not want to go to a SNF (or a residential hospice, for that matter) in any case. Pt's family expresses concerns, as above. I also reiterated that whether pt goes home or not, HD will continue to be difficult and uncomfortable. I bluntly stated that I was also concerned for future compliance, whether pt is at home or at [facility].  Option 3: Stop HD and pursue comfort/hospice care, at home or at a facility.  Concerns for this option: Pt repeatedly states, "I want dialysis, I'm not ready to die." I reiterated all of the points noted above, and reassured pt that I am not suggesting that she "should go home and die," but that at this point comfort care is probably a more reasonable and certainly more comfortable option for her, with all her comorbidities, history of non-compliance and history of discomfort with and after dialysis  regardless of compliance.  At this time, pt wishes to continue dialysis and wishes to continue plans to go home. Palliative care is still involved and has independently ordered a psychiatry consult for capacity evaluation (I cancelled the one that family medicine had requested, but I have no problem with pursuing an evaluation) though I do feel she has capacity and is simply making very poor and slightly contradictory decisions (i.e, wanting to continue HD but also wanting to go home where she cannot be care for, adequately). I will continue to be in contact with case management/social work, palliative care, and nephrology. At time of this writing, family and pt were left in the room to discuss and think decisions over, amongst themselves. I will continue to follow up as needed and have left a message with palliative care.  For now, I will continue to recommend dysphagia diet as recommended by SLP, as pt remains full code and it would not be prudent to allow her to eat what she wants if she indeed desires intubation and CPR. I will reiterate this with the patient and state to her that we will make one decision at a time; if she decides to no longer be full code and to stop HD, I have absolutely no problem letting her eat whatever she wants, but if she wants treatment and possible resuscitation, she will have to cooperate with [place].  Bobbye Morton, MD PGY-1, [facility] Family Medicine FPTS Intern pager: [phone]

## 2012-06-25 NOTE — Progress Notes (Signed)
FMTS Attending Daily Note: Camdin Hegner MD [phone] pager office [phone] I  have seen and examined this patient, reviewed their chart. I have discussed this patient with the resident. I agree with the resident's findings, assessment and care plan. 

## 2012-06-25 NOTE — Progress Notes (Signed)
Noted Dr. Deterding's documentation of pt's wish to stop HD. Pt has seemed rational and understanding all of the information presented to her by various clinicians, and stopping HD to pursue comfort care is reasonable given all of her comorbidities and the current hospital course. Will cancel pending psychiatry consult, given that pt appears to have capacity, and that family (mother is HCPOA) is in agreement with palliative/hospice services, regardless.  Will plan to follow up with pt, family, and palliative care/social work to facilitate discharge planning. Assistance from all involved parties is greatly appreciated by FTPS.  Bobbye Morton, MD PGY-1, [facility] Family Medicine FPTS Intern pager: [phone]

## 2012-06-25 NOTE — Progress Notes (Signed)
Pt seen at bedside. Informed by nursing and pt's sister that she has refused going to dialysis this morning "until she talks to the kidney doctor." I urged pt to go to dialysis and explained that the nephrologist may not see her until she is already on her HD treatment; pt stated that she would prefer to talk to the nephrologist, first. I discussed with Hartford Poli, RN of the HD unit, who reiterated morning's events. Per Mr. Adriana Mccallum, HD will try to take patient again late this morning or early this afternoon. I requested for nephrologist, if possible, to see pt briefly this morning to urge her to go on dialysis.  Full progress note to follow, shortly.  Bobbye Morton, MD PGY-1, [facility] Family Medicine FPTS Intern pager: [phone]

## 2012-06-25 NOTE — Progress Notes (Signed)
Nutrition Follow-up  Intervention:   1. Continue to encourage meals as able 2. RD to continue to follow along with GOC  Assessment:   EGD showed esophagitis and gastritis. Self-extubated 10/27. BSE completed on 10/28 recommending Dysphagia 2 with Nectar-Thickened Liquids. FEES on 10/29 with same recommendations. Intake of meals is 25% this morning, however family states that she has been eating well yesterday. Nepro Shake ordered today. Pt states that she enjoys these.  Pt refused HD today. Pt has a pending psyc eval for capacity. Per nephrology note, pt to stop HD.  Diet Order:  Dysphagia 2 with Nectar Thickened Liquids Supplement: Nepro PO TID  Meds: Scheduled Meds:   . alteplase  4 mg Intracatheter Once  . alteplase  2 mg Intracatheter Once  . antiseptic oral rinse  15 mL Mouth Rinse QID  . ARIPiprazole  10 mg Oral Daily  . bacitracin-polymyxin b   Both Eyes QHS  . darbepoetin (ARANESP) injection - DIALYSIS  150 mcg Intravenous Q Thu-HD  . feeding supplement (NEPRO CARB STEADY)  237 mL Oral TID WC  . ferric gluconate (FERRLECIT/NULECIT) IV  125 mg Intravenous Weekly  . heparin  40 Units/kg Dialysis Once in dialysis  . insulin aspart  0-15 Units Subcutaneous TID WC  . insulin glargine  15 Units Subcutaneous QHS  . metoCLOPramide  10 mg Oral TID AC & HS  . multivitamin  1 tablet Oral Daily  . mupirocin cream   Topical Daily  . ofloxacin  1 drop Both Eyes TID  . pantoprazole (PROTONIX) IV  40 mg Intravenous Q24H  . PARoxetine  80 mg Oral Daily  . sodium chloride  10 mL Intravenous Q12H  . sodium chloride  3 mL Intravenous Q12H  . vancomycin  1,000 mg Intravenous Q24H  . DISCONTD: feeding supplement (NEPRO CARB STEADY)  237 mL Oral TID BM  . DISCONTD: insulin aspart  1-3 Units Subcutaneous Q4H  . DISCONTD: insulin glargine  10 Units Subcutaneous Q24H  . DISCONTD: PARoxetine  20 mg Oral Daily  . DISCONTD: PARoxetine  60 mg Oral BH-q7a  . DISCONTD: PARoxetine  80 mg Oral BH-q7a    . DISCONTD: PARoxetine  80 mg Oral BH-q7a   Continuous Infusions:   . dialysis replacement fluid (prismasate) 500 mL/hr at 06/24/12 1610  . dialysis replacement fluid (prismasate) 500 mL/hr at 06/24/12 0637  . dialysate (PRISMASATE) 1,000 mL/hr at 06/24/12 1158  . DISCONTD: sodium chloride 20 mL/hr at 06/21/12 2148  . DISCONTD: sodium chloride 250 mL/hr at 06/24/12 0350   PRN Meds:.sodium chloride, sodium chloride, ALPRAZolam, alteplase, food thickener, heparin, heparin, lidocaine, lidocaine-prilocaine, oxyCODONE-acetaminophen, pentafluoroprop-tetrafluoroeth, sodium chloride, DISCONTD: ALPRAZolam, DISCONTD: feeding supplement (NEPRO CARB STEADY), DISCONTD: fentaNYL, DISCONTD: LORazepam, DISCONTD: oxyCODONE-acetaminophen   CMP     Component Value Date/Time   NA 140 06/24/2012 0430   K 4.0 06/24/2012 0430   CL 104 06/24/2012 0430   CO2 25 06/24/2012 0430   GLUCOSE 151* 06/24/2012 0430   BUN 8 06/24/2012 0430   CREATININE 1.20* 06/24/2012 0430   CREATININE 1.78* 10/17/2008 2000   CALCIUM 8.8 06/24/2012 0430   CALCIUM 8.3* 08/21/2011 0533   PROT 6.0 06/24/2012 0430   ALBUMIN 1.9* 06/24/2012 0430   AST 21 06/24/2012 0430   ALT 53* 06/24/2012 0430   ALKPHOS 175* 06/24/2012 0430   BILITOT 0.2* 06/24/2012 0430   GFRNONAA 55* 06/24/2012 0430   GFRAA 64* 06/24/2012 0430    CBG (last 3)   Basename 06/24/12 2154 06/24/12 1956 06/24/12 1734  GLUCAP 324* 265* 178*   Phosphorus  Date/Time Value Range Status  06/24/2012  4:30 AM 3.5  2.3 - 4.6 mg/dL Final      Intake/Output Summary (Last 24 hours) at 06/25/12 0938 Last data filed at 06/24/12 1900  Gross per 24 hour  Intake   2690 ml  Output   2104 ml  Net    586 ml   Ht: 5\' 10"  (1.778 m) per patient  Weight Status:  79.9 kg/176 lb; wt down 23 lb x 5 days likely 2/2 fluid status (pt previously receiving CRRT)  Body mass index is 23.24 kg/(m^2). Weight is WNL  Re-estimated needs:  1800 - 2000 kcal, 85 - 100  grams  Nutrition Dx:  Inadequate oral intake r/t poor appetite and acute medical issues AEB pt report.  Goal:  Intake to meet >90% of estimated nutrition needs. Unmet.  Monitor:  Weights, labs, PO intake, I/O's  Jarold Motto MS, RD, LDN Pager: [phone] After-hours pager: [phone]

## 2012-06-25 NOTE — Progress Notes (Signed)
Subjective: Interval History: refused to go to dialysis.  Will do it tomorrow or so.  Discussed that misssing and s/o and not doing dialysis has led to a lot of her problems and downhill course and that it is ok to quit with all she is going through.  I would take a lot to keep her going even with optimal tx and she would still deteriorate with the shape she is in.  She requests to quit dialysis and get hospice involved..  Objective: Vital signs in last 24 hours: Temp:  [97.7 F (36.5 C)-98.3 F (36.8 C)] 98.1 F (36.7 C) (10/30 0751) Pulse Rate:  [89-94] 93  (10/30 0751) Resp:  [14-25] 18  (10/30 0751) BP: (67-110)/(41-66) 109/65 mmHg (10/30 0751) SpO2:  [96 %-100 %] 96 % (10/30 0751) Weight change:   Intake/Output from previous day: 10/29 0701 - 10/30 0700 In: 3310 [P.O.:360; I.V.:2750; IV Piggyback:200] Out: 2852  Intake/Output this shift: Total I/O In: 240 [P.O.:240] Out: -   General appearance: cooperative, pale, slowed mentation and coherent Resp: diminished breath sounds bilaterally, rales bibasilar and rhonchi bilaterally Chest wall: right sided chest wall tenderness, left sided chest wall tenderness, LIJ cath Cardio: S1, S2 normal and systolic murmur: systolic ejection 2/6, decrescendo at 2nd left intercostal space GI: pos bs, tender, liver down 5 cm 2+ edema  Lab Results:  [facility] 06/24/12 0430 06/23/12 2149  WBC 21.4* 26.2*  HGB 9.6* 9.9*  HCT 30.9* 31.0*  PLT 184 179   BMET:  Basename 06/24/12 0430 06/23/12 1350  NA 140 142  K 4.0 4.4  CL 104 106  CO2 25 23  GLUCOSE 151* 118*  BUN 8 12  CREATININE 1.20* 1.32*  CALCIUM 8.8 8.7   No results found for this basename: PTH:2 in the last 72 hours Iron Studies: No results found for this basename: IRON,TIBC,TRANSFERRIN,FERRITIN in the last 72 hours  Studies/Results: No results found.  I have reviewed the patient's current medications.  Assessment/Plan: 1 CRF will stop HD per discussion above.  Discussed  with family also 2 Anemia 3 Dm 4 PVD 5 Chronic pain 6 Depression P stop hd, limit meds, no blood draws Hospice    LOS: 8 days   Abilene Mcphee L 06/25/2012,10:05 AM

## 2012-06-25 NOTE — Progress Notes (Addendum)
Patient WU:[id number] [name]      DOB: January 28, 1971      YNW:[id number]  Events and decisions since 06/24/12 noted concerning change in patients decision regarding code status and continuation of dialysis. Will follow up with patient and family regarding hospice choice and plan for disposition.  Speech therapist Ferdinand Lango ,approached me concerned about patients ability to make informed decisions, stated she that patient wants to eat "cucumbers and vinegar" despite the fact she was well educated on the high risk for aspiration based on study done by SLP yesterday, and recommendation made by SLP. Patient also indicated to her that she "Will be Going" to dialysis tomorrow, after informing Dr Donnetta Simpers this morning she did not want continue HD and preferred comfort approach and was agreeable for hospice services.  I have placed a psyhciatry consult to determine patients level of decision making capacity, also will try to schedule follow up meeting with family and patient tomorrow. Left message for Dr Casper Harrison regarding the above information and plan    Freddie Breech, CNS-C Palliative Medicine Team [facility] Health Team Phone: [phone] Pager: [phone]

## 2012-06-26 DIAGNOSIS — R1310 Dysphagia, unspecified: Secondary | ICD-10-CM

## 2012-06-26 DIAGNOSIS — K922 Gastrointestinal hemorrhage, unspecified: Secondary | ICD-10-CM | POA: Diagnosis not present

## 2012-06-26 LAB — RENAL FUNCTION PANEL
CO2: 27 mEq/L (ref 19–32)
Chloride: 101 mEq/L (ref 96–112)
GFR calc Af Amer: 34 mL/min — ABNORMAL LOW (ref >90)
GFR calc non Af Amer: 29 mL/min — ABNORMAL LOW (ref >90)
Sodium: 139 mEq/L (ref 135–145)

## 2012-06-26 LAB — GLUCOSE, CAPILLARY: Glucose-Capillary: 193 mg/dL — ABNORMAL HIGH (ref 70–99)

## 2012-06-26 MED ORDER — PANTOPRAZOLE SODIUM 40 MG PO TBEC
40.0000 mg | DELAYED_RELEASE_TABLET | Freq: Every day | ORAL | Status: DC
  Administered 2012-06-27 – 2012-06-28 (×2): 40 mg via ORAL
  Filled 2012-06-26 (×2): qty 1

## 2012-06-26 MED ORDER — LIDOCAINE HCL (PF) 1 % IJ SOLN: 5.0000 mL | INTRAMUSCULAR | Status: DC | PRN

## 2012-06-26 MED ORDER — SODIUM CHLORIDE 0.9 % IV SOLN: 100.0000 mL | INTRAVENOUS | Status: DC | PRN

## 2012-06-26 MED ORDER — ALTEPLASE 2 MG IJ SOLR
2.0000 mg | Freq: Once | INTRAMUSCULAR | Status: AC | PRN
  Filled 2012-06-26: qty 2

## 2012-06-26 MED ORDER — NA FERRIC GLUC CPLX IN SUCROSE 12.5 MG/ML IV SOLN
125.0000 mg | INTRAVENOUS | Status: DC
  Administered 2012-06-27: 125 mg via INTRAVENOUS
  Filled 2012-06-26 (×2): qty 10

## 2012-06-26 MED ORDER — PENTAFLUOROPROP-TETRAFLUOROETH EX AERO: 1.0000 "application " | INHALATION_SPRAY | CUTANEOUS | Status: DC | PRN

## 2012-06-26 MED ORDER — LIDOCAINE-PRILOCAINE 2.5-2.5 % EX CREA: 1.0000 "application " | TOPICAL_CREAM | CUTANEOUS | Status: DC | PRN

## 2012-06-26 MED ORDER — INSULIN GLARGINE 100 UNIT/ML ~~LOC~~ SOLN
33.0000 [IU] | Freq: Every day | SUBCUTANEOUS | Status: DC
  Administered 2012-06-26 – 2012-06-27 (×2): 33 [IU] via SUBCUTANEOUS

## 2012-06-26 MED ORDER — BISACODYL 10 MG RE SUPP: 10.0000 mg | Freq: Every day | RECTAL | Status: DC | PRN

## 2012-06-26 MED ORDER — DARBEPOETIN ALFA-POLYSORBATE 150 MCG/0.3ML IJ SOLN
150.0000 ug | INTRAMUSCULAR | Status: DC
  Administered 2012-06-27: 150 ug via INTRAVENOUS
  Filled 2012-06-26: qty 0.3

## 2012-06-26 MED ORDER — CLOPIDOGREL BISULFATE 75 MG PO TABS
75.0000 mg | ORAL_TABLET | Freq: Every day | ORAL | Status: DC
  Administered 2012-06-27 – 2012-06-28 (×2): 75 mg via ORAL
  Filled 2012-06-26 (×4): qty 1

## 2012-06-26 MED ORDER — HEPARIN SODIUM (PORCINE) 1000 UNIT/ML DIALYSIS
40.0000 [IU]/kg | Freq: Once | INTRAMUSCULAR | Status: AC
  Administered 2012-06-27: 3100 [IU] via INTRAVENOUS_CENTRAL
  Filled 2012-06-26: qty 4

## 2012-06-26 MED ORDER — NEPRO/CARBSTEADY PO LIQD: 237.0000 mL | ORAL | Status: DC | PRN

## 2012-06-26 MED ORDER — HEPARIN SODIUM (PORCINE) 1000 UNIT/ML DIALYSIS: 1000.0000 [IU] | INTRAMUSCULAR | Status: DC | PRN

## 2012-06-26 MED ORDER — ALPRAZOLAM 0.5 MG PO TABS
1.0000 mg | ORAL_TABLET | Freq: Three times a day (TID) | ORAL | Status: DC | PRN
  Administered 2012-06-27 – 2012-06-28 (×2): 1 mg via ORAL
  Filled 2012-06-26: qty 1
  Filled 2012-06-26: qty 2
  Filled 2012-06-26: qty 1

## 2012-06-26 NOTE — Progress Notes (Signed)
Occupational Therapy Treatment Patient Details Name: [name] MRN: [id number] DOB: 1971/05/12 Today's Date: 06/26/2012 Time: 4098-1191 OT Time Calculation (min): 19 min  OT Assessment / Plan / Recommendation Comments on Treatment Session Second session today completed due to family trying to transfer pt as she is to go home today/tomorrow. Family unable to transfer pt. Assisted pt to [facility] with Mod A stand pivot and mod A with hygiene after toileting. Family tearful at end of session, stating that they could not assist pt at this level. Discussed with pt at length need for rehab at [facility]. Discussed role of rehab at SNF, with goal to improve strength and endurance and independence with ADL and mobility. Pt stated that she would agree to going to SNF. Discussed with SW and case worker. Family in agreement.    Follow Up Recommendations  Skilled nursing facility    Barriers to Discharge  Decreased caregiver support    Equipment Recommendations  Tub/shower bench;Wheelchair (measurements);Wheelchair cushion (measurements);3 in 1 bedside comode;Other (comment)    Recommendations for Other Services Other (comment)  Frequency Min 2X/week   Plan Discharge plan remains appropriate    Precautions / Restrictions Precautions Precautions: Fall Restrictions Weight Bearing Restrictions: No   Pertinent Vitals/Pain none    ADL  Eating/Feeding: Independent Where Assessed - Eating/Feeding: Chair Grooming: Set up Where Assessed - Grooming: Unsupported sitting Upper Body Bathing: Set up Where Assessed - Upper Body Bathing: Supported sitting Lower Body Bathing: Maximal assistance Where Assessed - Lower Body Bathing: Supported sitting;Lean right and/or left Upper Body Dressing: Set up Where Assessed - Upper Body Dressing: Supported sitting Lower Body Dressing: Maximal assistance Where Assessed - Lower Body Dressing: Supported sitting;Lean right and/or left Toilet Transfer: Moderate  assistance Toilet Transfer Method: Stand pivot Toilet Transfer Equipment: Bedside commode Toileting - Clothing Manipulation and Hygiene: Maximal assistance Where Assessed - Engineer, mining and Hygiene: Lean right and/or left;Sit on 3-in-1 or toilet Equipment Used: Gait belt Transfers/Ambulation Related to ADLs: Mod A sit - stand ADL Comments: Pt needs drop arm BSC    OT Diagnosis: Generalized weakness;Cognitive deficits;Acute pain  OT Problem List: Decreased strength;Decreased range of motion;Decreased activity tolerance;Decreased cognition;Decreased safety awareness;Decreased knowledge of use of DME or AE;Decreased knowledge of precautions;Cardiopulmonary status limiting activity;Impaired sensation;Pain;Increased edema OT Treatment Interventions: Self-care/ADL training;Therapeutic exercise;Energy conservation;DME and/or AE instruction;Therapeutic activities;Patient/family education   OT Goals Acute Rehab OT Goals OT Goal Formulation: With patient Time For Goal Achievement: 07/10/12 Potential to Achieve Goals: Good ADL Goals Pt Will Perform Lower Body Bathing: with min assist;Supported;with adaptive equipment;with cueing (comment type and amount);Sit to stand from bed ADL Goal: Lower Body Bathing - Progress: Progressing toward goals Pt Will Perform Lower Body Dressing: with mod assist;Supported;with adaptive equipment;with cueing (comment type and amount);Sit to stand from bed ADL Goal: Lower Body Dressing - Progress: Progressing toward goals Pt Will Transfer to Toilet: Drop arm 3-in-1;with min assist;Squat pivot transfer;with cueing (comment type and amount) ADL Goal: Toilet Transfer - Progress: Progressing toward goals Pt Will Perform Toileting - Hygiene: with supervision;Sitting on 3-in-1 or toilet;Leaning right and/or left on 3-in-1/toilet;with cueing (comment type and amount) ADL Goal: Toileting - Hygiene - Progress: Progressing toward goals Arm Goals Additional Arm  Goal #1: Pt will complete UB theraband strengthening program with min vc. Arm Goal: Additional Goal #1 - Progress: Progressing toward goals  Visit Information  Last OT Received On: 06/26/12 Assistance Needed: +1    Subjective Data      Prior Functioning  Home Living Lives With:  Family Available Help at Discharge: Family;Available 24 hours/day;Personal care attendant (aide 26 hours/week) Type of Home: House Home Access: Ramped entrance Home Layout: One level Bathroom Shower/Tub: Engineer, manufacturing systems: Standard Bathroom Accessibility: Yes How Accessible: Accessible via wheelchair;Accessible via walker Home Adaptive Equipment: Hospital bed;Walker - rolling;Other (comment);Bedside commode/3-in-1;Shower chair with back (lift chair, recently got a hospital bed (needs to be verifie) Additional Comments: Family has to lift pt legs over tub for her to step into tub to sit on seat for bathing. Pt has not been sleeping in hospital bed due to mattress too firm and uses gel cushion and sleeps in lift chair; Family reports they are quite taxed in providing care for [facility] Prior Function Level of Independence: Needs assistance Needs Assistance: Light Housekeeping;Meal Prep;Transfers;Gait;Dressing;Bathing;Toileting Bath: Moderate Dressing: Moderate Toileting: Maximal Meal Prep: Total Light Housekeeping: Total Gait Assistance: mod assist with RW limited distances grossly 15' but not ambulating for the last few weeks Transfer Assistance: min assist into tub, pt can transfer from lift chair to [facility] on her own, assist for transfer from [facility] grossly min assist Able to Take Stairs?: No Driving: No Vocation: On disability Communication Communication: No difficulties Dominant Hand: Right    Cognition  Overall Cognitive Status: History of cognitive impairments - at baseline Area of Impairment: Problem solving;Other (comment) Arousal/Alertness: Awake/alert Orientation Level: Appears intact for  tasks assessed Behavior During Session: [facility] for tasks performed Safety/Judgement: Decreased awareness of need for assistance Safety/Judgement - Other Comments: Pt continues to want to go home; Seems unaware of burden of care for mother and sisters; Also demonstrating lack of understanding of medical status, declining HD despite MD's best recommendations Problem Solving: min vc Cognition - Other Comments: Pts family states they can not care for her at this level.     Mobility  Shoulder Instructions Transfers Transfers: Sit to Stand;Stand to Sit Sit to Stand: 3: Mod assist;With upper extremity assist;From toilet Stand to Sit: 3: Mod assist;With upper extremity assist;To chair/3-in-1 Details for Transfer Assistance: vc for pbroblem solving. family tearful and stated they could not assist pt at this level       Exercises      Balance Static Sitting Balance Static Sitting - Balance Support: No upper extremity supported;Feet supported Static Sitting - Level of Assistance: 6: Modified independent (Device/Increase time)   End of Session OT - End of Session Equipment Utilized During Treatment: Gait belt Activity Tolerance: Patient tolerated treatment well Patient left: in chair;with call bell/phone within reach;with family/visitor present Nurse Communication: Mobility status  GO     Cathan Gearin,HILLARY 06/26/2012, 2:30 PM [facility], OTR/L  [phone] 06/26/2012

## 2012-06-26 NOTE — Progress Notes (Signed)
Occupational Therapy Evaluation Patient Details Name: [name] MRN: [id number] DOB: 1971/05/22 Today's Date: 06/26/2012 Time: 1223-1300 OT Time Calculation (min): 37 min  OT Assessment / Plan / Recommendation Clinical Impression  41 yo with ESRD, DM, CAD, MI, who presented with low blood sugar. Cardiac arrest with CPR during this hospitalization. Rec SNF, although pt declines SNF at this time. If pt is to D/C home, pt needs tub bench, drop arm BSC, w/c with cushion and HHOT/PT. Pt will benefit from skilled OT serivces to max independence with ADL and functional moibility for ADL to facilitte D/C to next venue of care.    OT Assessment  Patient needs continued OT Services    Follow Up Recommendations  Skilled nursing facility    Barriers to Discharge Decreased caregiver support    Equipment Recommendations  Tub/shower bench;Wheelchair (measurements);Wheelchair cushion (measurements);3 in 1 bedside comode;Other (comment) (drop arm BSC)    Recommendations for Other Services Other (comment) (psych consult for capacity) regarding D/C concerns and   Frequency  Min 2X/week    Precautions / Restrictions Precautions Precautions: Fall Restrictions Weight Bearing Restrictions: No   Pertinent Vitals/Pain No c/o pain    ADL  Eating/Feeding: Independent Where Assessed - Eating/Feeding: Chair Grooming: Set up Where Assessed - Grooming: Unsupported sitting Upper Body Bathing: Set up Where Assessed - Upper Body Bathing: Supported sitting Lower Body Bathing: Maximal assistance Where Assessed - Lower Body Bathing: Supported sitting;Lean right and/or left Upper Body Dressing: Set up Where Assessed - Upper Body Dressing: Supported sitting Lower Body Dressing: Maximal assistance Where Assessed - Lower Body Dressing: Supported sitting;Lean right and/or left Toilet Transfer: Minimal assistance;Other (comment) Toilet Transfer Method: Squat pivot Toilet Transfer Equipment: Other (comment)  (bed - chair) Toileting - Clothing Manipulation and Hygiene: Maximal assistance Where Assessed - Engineer, mining and Hygiene: Lean right and/or left Equipment Used: Gait belt Transfers/Ambulation Related to ADLs: Min a squat pivot transfer ADL Comments: Pt requires A for LB ADL. Pt will benefit from AE to increase independence (Educated pt on use of AE for LB ADL)    OT Diagnosis: Generalized weakness;Cognitive deficits;Acute pain  OT Problem List: Decreased strength;Decreased range of motion;Decreased activity tolerance;Decreased cognition;Decreased safety awareness;Decreased knowledge of use of DME or AE;Decreased knowledge of precautions;Cardiopulmonary status limiting activity;Impaired sensation;Pain;Increased edema OT Treatment Interventions: Self-care/ADL training;Therapeutic exercise;Energy conservation;DME and/or AE instruction;Therapeutic activities;Patient/family education   OT Goals Acute Rehab OT Goals OT Goal Formulation: With patient Time For Goal Achievement: 07/10/12 Potential to Achieve Goals: Good ADL Goals Pt Will Perform Lower Body Bathing: with min assist;Supported;with adaptive equipment;with cueing (comment type and amount);Sit to stand from bed ADL Goal: Lower Body Bathing - Progress: Goal set today Pt Will Perform Lower Body Dressing: with mod assist;Supported;with adaptive equipment;with cueing (comment type and amount);Sit to stand from bed ADL Goal: Lower Body Dressing - Progress: Goal set today Pt Will Transfer to Toilet: Drop arm 3-in-1;with min assist;Squat pivot transfer;with cueing (comment type and amount) ADL Goal: Toilet Transfer - Progress: Goal set today Pt Will Perform Toileting - Hygiene: with supervision;Sitting on 3-in-1 or toilet;Leaning right and/or left on 3-in-1/toilet;with cueing (comment type and amount) ADL Goal: Toileting - Hygiene - Progress: Goal set today Arm Goals Additional Arm Goal #1: Pt will complete UB theraband  strengthening program with min vc. Arm Goal: Additional Goal #1 - Progress: Goal set today  Visit Information  Last OT Received On: 06/26/12 Assistance Needed: +1    Subjective Data      Prior Functioning  Home Living Lives With: Family Available Help at Discharge: Family;Available 24 hours/day;Personal care attendant (aide 26 hours/week) Type of Home: House Home Access: Ramped entrance Home Layout: One level Bathroom Shower/Tub: Engineer, manufacturing systems: Standard Bathroom Accessibility: Yes How Accessible: Accessible via wheelchair;Accessible via walker Home Adaptive Equipment: Hospital bed;Walker - rolling;Other (comment);Bedside commode/3-in-1;Shower chair with back (lift chair, recently got a hospital bed (needs to be verifie) Additional Comments: Family has to lift pt legs over tub for her to step into tub to sit on seat for bathing. Pt has not been sleeping in hospital bed due to mattress too firm and uses gel cushion and sleeps in lift chair; Family reports they are quite taxed in providing care for [facility] Prior Function Level of Independence: Needs assistance Needs Assistance: Light Housekeeping;Meal Prep;Transfers;Gait;Dressing;Bathing;Toileting Bath: Moderate Dressing: Moderate Toileting: Maximal Meal Prep: Total Light Housekeeping: Total Gait Assistance: mod assist with RW limited distances grossly 15' but not ambulating for the last few weeks Transfer Assistance: min assist into tub, pt can transfer from lift chair to [facility] on her own, assist for transfer from [facility] grossly min assist Able to Take Stairs?: No Driving: No Vocation: On disability Communication Communication: No difficulties Dominant Hand: Right         Vision/Perception     Cognition  Overall Cognitive Status: History of cognitive impairments - at baseline Area of Impairment: Problem solving;Other (comment) (slow processing) Arousal/Alertness: Awake/alert Orientation Level: Appears  intact for tasks assessed Behavior During Session: [facility] for tasks performed Safety/Judgement: Decreased awareness of need for assistance Problem Solving: min vc Cognition - Other Comments: Pts family states they can not care for her at this level.     Extremity/Trunk Assessment Right Upper Extremity Assessment RUE ROM/Strength/Tone: [facility] for tasks assessed Left Upper Extremity Assessment LUE ROM/Strength/Tone: WFL for tasks assessed Right Lower Extremity Assessment RLE ROM/Strength/Tone: Deficits Left Lower Extremity Assessment LLE ROM/Strength/Tone: Deficits Trunk Assessment Trunk Assessment: Normal     Mobility Transfers Details for Transfer Assistance: Min A squat pivot     Shoulder Instructions     Exercise     Balance Static Sitting Balance Static Sitting - Balance Support: No upper extremity supported;Feet supported Static Sitting - Level of Assistance: 6: Modified independent (Device/Increase time)   End of Session OT - End of Session Equipment Utilized During Treatment: Gait belt Activity Tolerance: Patient tolerated treatment well Patient left: in chair;with call bell/phone within reach;with family/visitor present Nurse Communication: Mobility status  GO     Emojean Gertz,HILLARY 06/26/2012, 1:48 PM [facility], OTR/L  [phone] 06/26/2012

## 2012-06-26 NOTE — Progress Notes (Signed)
PGY-1 Update  Pt now agreeable to SNF. CSW and CM aware, will search for beds near [place] (pt lives there, has a PCP there, and gets HD there). Will follow-up with DC plans.  Bobbye Morton, MD PGY-1, [facility] FPTS Intern pager: [phone]

## 2012-06-26 NOTE — Progress Notes (Signed)
PHARMACIST - PHYSICIAN COMMUNICATION DR:   Sheffield Slider CONCERNING: Protonix IV to Oral Route Change Policy  RECOMMENDATION: This patient is receiving Protonix by the intravenous route.  Based on criteria approved by the Pharmacy and Therapeutics Committee, this drug is being converted to the equivalent oral dose form(s).  DESCRIPTION: These criteria include:  The patient is eating (either orally or via tube) and/or has been taking other orally administered medications for a least 24 hours  There is no active GI bleed or impaired GI absorption noted.   If you have questions about this conversion, please contact the Pharmacy Department  []   [phone] )  [facility] [x]   [phone] )  [facility]  []   [phone] )  [facility] []   [phone] )  [facility]    Georgina Pillion, PharmD, BCPS 06/26/2012 2:29 PM

## 2012-06-26 NOTE — Progress Notes (Signed)
Clinical Social Work Department BRIEF PSYCHOSOCIAL ASSESSMENT 06/26/2012  Patient:  HAJER, DWYER     Account Number:  [id number]     Admit date:  06/17/2012  Clinical Social Worker:  Ronda Fairly, CLINICAL SOCIAL WORKER  Date/Time:  06/26/2012 10:15 AM  Referred by:  Physician  Date Referred:  06/25/2012 Referred for  Other - See comment   Other Referral:   Physician requested psych consult to access patient's intent to follow through with dyalisus program and emphasize importance of same.   Interview type:  Patient Other interview type:    PSYCHOSOCIAL DATA Living Status:  FAMILY Admitted from facility:   Level of care:   Primary support name:  [name] Primary support relationship to patient:  PARENT Degree of support available:   patient states family has been able to keep up with patient' needs    CURRENT CONCERNS Current Concerns  Other - See comment   Other Concerns:   Patient's commitment to dyalisis program that has been developed for her as in past patient has sometimes refused to stay for complete treatment and also refused to go to scheduled appointment    SOCIAL WORK ASSESSMENT / PLAN Patient was seen between nursing care and blood draw yet was agreeable to talking and reports she now sees the necessity for compliance with dialysis program   Assessment/plan status:   Other assessment/ plan:   Information/referral to community resources:    PATIENT'S/FAMILY'S RESPONSE TO PLAN OF CARE: Patient expressed gratitude for CSW visit and became tearful at one point and asked CSW to keep her in prayer.  CSW signing off.

## 2012-06-26 NOTE — Discharge Summary (Addendum)
Addendum: Updated 11/2; pt discharged home 11/2 rather than to SNF on 11/1 per pt/family decision after original DC summary was filed. Relevant details corrected as appropriate, below.  Family Medicine Teaching [facility] Discharge Summary  Patient name: [name] Medical record number: [id number] Date of birth: December 17, 1970 Age: 41 y.o. 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 Acute GI bleed (resolved) requiring multiple transfusions Dysphagia Neuropathic pain of legs Anxiety Bipolar I disorder Left leg cellulitis (resolved) Cardiac arrest (resolved) Acute respiratory failure with hypoxia (resolved)  Consultations: Nephrology, CCM, GI, palliative care, care management/social work  Significant Labs and Imaging:   Lab 06/27/12 1405 06/25/12 1227 06/24/12 0430  WBC 9.8 17.4* 21.4*  HGB 7.9* 9.2* 9.6*  HCT 26.3* 29.4* 30.9*  PLT 253 180 184    Lab 06/28/12 0530 06/27/12 1405 06/27/12 0819 06/24/12 0430  NA 139 139 140 --  K 3.9 3.2* 3.3* --  CL 102 98 99 --  CO2 25 27 26  --  BUN 16 30* 25* --  CREATININE 1.96* 3.65* 3.47* --  CALCIUM 9.0 9.3 9.7 --  PROT -- 6.0 -- 6.0  BILITOT -- 0.1* -- 0.2*  ALKPHOS -- 168* -- 175*  ALT -- 17 -- 53*  AST -- 12 -- 21  GLUCOSE 198* 268* 222* --     Procedures: Hemodialysis cath replacement x2 while admitted; CRRT and EGD while in the ICU  Brief Hospital Course: [name] is a 41 y.o. year old female who presented with elevated blood sugars after coming to the hospital for planned HD catheter change and  pre-treatment with prednisone for contrast allergy. Found with hyperglycemia suggestive of DKA vs hyperosmolarity with CBG >600, anion gap 22, WBC 20.9, BP ~180's-190's/90's-100's. Pt was initially managed with insulin drip, transitioned to subQ insulin 10/23. Pt's HD catheter was changed 10/23, but pt did not complete HD that day due to machine malfunction/patient refusal to restart. Pt suffered PEA cardiopulmonary arrest 10/24, requiring CPR/intbuation/medications and transfer to ICU for ventilator support and CRRT; arrest believed to be multifactorial including elevated K (6.4) and drop in Hb (5.8) presumed to be fluid shift and GI bleed (pt had bloody stools the night of 10/23-24, as well as melena while in the ICU). Pt required a second replacement of her HD catheter and was transfused 8 units while in the ICU; EGD showed grade 1 distal esophagitis, stress gastritis, and mucosal trauma from OGT (likely caused during resuscitation). Pt was deemed too unstable at that point to undergo colonoscopy, and pt/family refused further procedures, regardless. Pt has had no further evidence of bleeding since 10/28. Transferred back to regular floor 10/29, resuming HD and regular management of DM, etc. Pt and family decision just prior to discharge was to go home rather than to SNF, with home health services including RN, SLP, PT/OT, and CSW for possible placement at an SNF in [place], as [place] was not pt/family's first choice for SNF. Pt remained in the hospital overnight 11/1 and was discharged home 11/2. See goals of care and details by problem list, below.   Goals of care: Pt has had multiple discussions with both primary family medicine team and  palliative care teams, as well as nephrology and CCM, throughout the admission. Pt was intially full-code and required resuscitation as above; subsequently, palliative care was consulted and pt was made a DNR with MOST form completed. After coming out of the ICU, pt  stated she desired full code again and that she wished to continue HD. Pt has had a history of non-compliance with HD resulting in admissions to the hospital, and non-compliance contributed to her cardiopulmonary arrest on 10/24. Despite this, pt has stated she continues to desire HD and that she will be compliant with HD and her DM regimen, in the future. Pt also initially adamantly refused SNF with desire to return home, despite family's concerns that they are unable to provide adequate care for her, while PT/OT and MD's also recommended SNF for 24 hour supervision. Pt ultimately accepted SNF placement on 10/31 then pt/family decided to go home immediately prior to planned discharge 11/1, and was discharged with home health services 11/2; pt has repeatedly changed her mind back and forth about various goals of care and there is some concern for capacity, though pt appears to indeed have capacity and to be adequately processing the various risks/benefits and consequences of both continuing and discontinuing various therapies. Continued discussions with pt and family would be prudent to continue to address long-term goals, considering pt's multiple comorbidities and history of non-compliance, as well as the fact that with her history of multiple HD access procedures, she will eventually possibly be unable to continue HD secondary to lack of access.  Problem list: 1. DM type II with multiple complications - Poor compliance with outpt insulin/CBG regimen. Presented with CBG >600 and gap 22, as above. Initially managed with insulin drip, then re-converted to subQ. Pt on Lantus 33 units qHS, Humalog SSI TID at home, with Reglan for gastroparesis. Last A1c 6.9. Multiple chronic/scarred wounds: right LE ulcers, groin incisions from previous bypass surgery, multiple buttocks ulcers. CBG's reasonably well controlled through admission and while in ICU. Pt to be restarted on home Lantus and Humalog regimen at  discharge.  2. ESRD on HD with anemia of chronic disease - Manged per nephrology service during this admission. On TThS schedule in [place], [place], to be restarted at discharge. Catheter changes and ICU course with CRRT, as above. Pt resumed HD after leaving the ICU; there is some question to the reliability/functionality of the current access line, though pt underwent HD 10/30, 11/1, and 11/2 prior to discharge without incident. Pt has hx of multiple graft/catheter placements even prior to this admission which will make future procedures for new access difficult, if possible.  3. HTN - Pt was hypertensive in the ED to 200's/100's. Per pt's sister, pt has been taken off "a few different drugs" earlier this year by a doctor during one hospital admission. During previous admission to our service 10/10, pt was intially hypotensive and then normotensive after gentle hydration and subsequent dialysis, not requiring medication. BP's improved this admission after dialysis/CRRT and at time of discharge; was managed intermittently with hydralazine PRN to keep systolic <160, diastolic <100.   4. Cellulitis of LLE +/- chronic venous stasis - Healed/healing from previous admission with course of abx not yet complete at time of admission, also with likely component of chronic venous stasis. At time of admission, was nearly circumferential from ankle almost to knee but does not appear as extensive as during previous admission. Pt was continued on vancomycin/Augmentin as well as Zosyn (while in the ICU) and completed a  course of abx; at time of discharge, pt has remained afebrile, cellulitis has improved, and WBC trending down. Venous stasis also greatly improved with CRRT/HD and elevation of lower limbs.  5. Skin care - Buttock ulcers in various stages of healing, present on admission, initially concerning for pressure ulcers of uncertain stage given amount of time pt spends in bed/wheelchair. 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 Managed with Bactroban/barrier skin cream and local wound care. Foam dressing and air mattress were used to reduce pressure.  6. CAD/PAD with history of MI - S/p stent placement in 2003; cath in 2009 showed some occlusion of stent with angioplasty and subsequent medically managed restenosis. Aortobifemoral graft and above-the-knee femoral-popliteal bypass on the right leg in May 2013. Echo in 01/2012 showed EF of 60-65% with normal systolic function and mild LVH. Plavix was held during admission for replacement of HD caths x2; to be restarted at discharge.  7. Bipolar disorder/anxiety/chronic pain - Continued on home Paxil, Abilify. Xanax and Percocet were ordered PRN (half home dose, see somnolence/OSA below).   8. GERD - Protonix was used in place of home omeprazole while admitted.  9. Hx multiple eye surgeries - Continued home eyedrops (Polysporin, ofloxacin).   10. Hx of migraines - No complaint headaches during admission. Fioricet held due to decreased mental status at admission, to be restarted at discharge.   11. Seasonal allergies - Continued home Flonase at d/c.   12. Somnolence - Slept/snored thorugh majority of time in ED, easily awakened; likely a component of gross fluid overload and hyperglycemia/DKA. Pt had similar behavior/appearance in previous admission; pt was often very indifferent to her current status and to questioning and is sometimes very uncooperative with exam. Pt also refused multiple meds and ended HD treatment early in admission, as above; later in admission and after coming out of the ICU, pt was generally more interactive (see goals of care above).  13. History of OSA - Adamantly refused CPAP early in admission. CO2 monitored in ED and not elevated as expected. Patient judged to be at high risk for becoming hypercarbic so we reduced home Percocet and Xanax doses during the admission.  Respiratory status improved s/p HD treatments and CRRT in the ICU; resumed regular home meds/doses at discharge. Pt remains with intermittent O2 requirement, mostly at night.  14. GI bleed/acute anemia - As above. Transfused 8 units of blood during ICU course, EGD with grade 1 distal esophagitis, stress gastritis, and mucosal trauma from OGT (likely caused during resuscitation); pt was too unstable for and refused colonoscopy. No further evidence of bleeding since 10/28. Discharged with continued PPI therapy.  Discharge Medications:    Medication List     As of 06/28/2012  8:44 PM    STOP taking these medications         amoxicillin-clavulanate 400-57 MG/5ML suspension   Commonly known as: AUGMENTIN      TAKE these medications         albuterol 108 (90 BASE) MCG/ACT inhaler   Commonly known as: PROVENTIL HFA;VENTOLIN HFA   Inhale 2 puffs into the lungs every 6 (six) hours as needed. Shortness of breath      ALPRAZolam 1 MG tablet   Commonly known as: XANAX   Take 1 tablet (1 mg total) by mouth 3 (three) times daily as needed. For anxiety      ARIPiprazole 2 MG tablet   Commonly  known as: ABILIFY   Take 10 mg by mouth daily.      b complex-vitamin c-folic acid 0.8 MG Tabs   Take 0.8 mg by mouth at bedtime.      bacitracin ophthalmic ointment   Place 1 application into both eyes at bedtime. apply to eye      butalbital-acetaminophen-caffeine 50-325-40 MG per tablet   Commonly known as: FIORICET, ESGIC   Take 1 tablet by mouth 3 (three) times daily as needed for headache. For headaches      cinacalcet 30 MG tablet   Commonly known as: SENSIPAR   Take 30 mg by mouth daily with supper.      clopidogrel 75 MG tablet   Commonly known as: PLAVIX   Take 75 mg by mouth daily. Last dose 12/17/2011      darbepoetin 60 MCG/0.3ML Soln   Commonly known as: ARANESP   Inject 60 mcg into the vein every Saturday with hemodialysis.      fluticasone 50 MCG/ACT nasal spray   Commonly  known as: FLONASE   Place 1 spray into the nose daily.      furosemide 40 MG tablet   Commonly known as: LASIX   Take 40 mg by mouth daily.      insulin glargine 100 UNIT/ML injection   Commonly known as: LANTUS   Inject 33 Units into the skin at bedtime.      insulin lispro 100 UNIT/ML injection   Commonly known as: HUMALOG   Inject 7-15 Units into the skin 4 (four) times daily -  before meals and at bedtime. TAKING ON SLIDING SCALE  200-249=7 units  250-299=10 units  300-349=12 units  >350=15 units      methocarbamol 500 MG tablet   Commonly known as: ROBAXIN   Take 500 mg by mouth 2 (two) times daily.      metoCLOPramide 10 MG tablet   Commonly known as: REGLAN   Take 10 mg by mouth 4 (four) times daily.      mupirocin ointment 2 %   Commonly known as: BACTROBAN   Apply 1 application topically daily.      Nepro Liqd   Take 237 mLs by mouth 3 (three) times daily with meals as needed.      ofloxacin 0.3 % ophthalmic solution   Commonly known as: OCUFLOX   Place 1 drop into both eyes 3 (three) times daily.      omeprazole 40 MG capsule   Commonly known as: PRILOSEC   Take 40 mg by mouth daily.      oxyCODONE-acetaminophen 10-325 MG per tablet   Commonly known as: PERCOCET   Take 1 tablet by mouth every 4 (four) hours as needed. For pain      PARoxetine 40 MG tablet   Commonly known as: PAXIL   Take 80 mg by mouth every morning.      promethazine 25 MG tablet   Commonly known as: PHENERGAN   Take 25 mg by mouth every 6 (six) hours as needed. For nausea      pyridoxine 100 MG tablet   Commonly known as: B-6   Take 100 mg by mouth daily.      sevelamer 800 MG tablet   Commonly known as: RENVELA   Take 1,600-4,000 mg by mouth 5 (five) times daily. *Take 5 tablets daily with each meal and take 2 tablets with snacks**        Issues for Follow Up:  1. DM/ESRD -  Please address continued compliance concerns with insulin regimen and HD. Pt has had multiple  vascular access points/procedures in the past and the current HD access line has had some question as to functionality; pt may ultimately have few or no new options for access, at which long-term goals of care would need to be addressed if HD must be stopped/is not able to continue (see below). 2. Skin/wound care - Pt has a history of multiple vascular procedures and generally has poor wound healing. Pt had sores on her buttocks in particular in various stages of healing, on admission. Would likely benefit from continued local skin care and frequent turns, etc. Pt also will need continued management of chronic LE venous stasis. 3. Deconditioning - Pt would benefit from continued PT/OT for the purposes of strengthening/reconditioning with the possible goal of eventually returning home. 4. Goals of care - Pt with multiple discussions from primary team (family medicine) and palliative care due to requiring CPR/ICU care; pt at one point was DNR/DNI but at time of discharge requests full code and continued therapies with HD, etc. Pt has had compliance issues in the past with DM medications/insulin, HD, etc. Pt and family will likely benefit from continued goals of care/end-of-life discussions. May also need to address capacity.  Outstanding Results: none  Discharge Instructions: Pt was discharged with home health services and instructions to resume home dialysis schedule and previous home medical regimens for chronic conditions, as detailed above.  Discharge Condition: stable Keon Benscoter, [place], MD 06/28/2012, 8:44 PM

## 2012-06-26 NOTE — Progress Notes (Signed)
Patient evaluated for community based chronic disease management services with [facility] Care Management Program as a benefit of patient's [employer]. Patient will receive a post discharge transition of care call and will be evaluated for monthly home visits for assessments and disease process education. Spoke with patient's sister/caregiver, Lupita Leash to explain [facility] Care Management services. Left contact information and [facility] literature at bedside. Made inpatient Case Manager aware that [facility] Care Management following. Of note, [facility] Care Management services does not replace or interfere with any services that are arranged by inpatient case management or social work.  For additional questions or referrals please contact Anibal Henderson BSN RN [facility] Liaison at [phone].

## 2012-06-26 NOTE — Clinical Social Work Note (Signed)
CSW talked with patient and family and sent out her clinical information to facilities in [place]. PASARR number will be submitted on 11/1. [place]-2 and Physicians form for authorization of level of care placed in shadow chart.  Genelle Bal, MSW, LCSW [phone]

## 2012-06-26 NOTE — Progress Notes (Signed)
Family Medicine Teaching Service Daily Progress Note Intern Pager: [phone]  Patient name: [name] Medical record number: [id number] Date of birth: 07-28-1971 Age: 41 y.o. Gender: female  Primary Care Provider: Ernestine Conrad, MD  Subjective: Pt seen at bedside this morning. Feels better, though still has her "normal" back/leg pain and half of her home Xanax does "isn't helping." Pt wants to go home and refuses even consideration of SNF. Family present in room feel SNF would be more appropriate for fear of further decompensation and risk of readmission, since they feel SNF would be better able to care for pt.  Objective: Temp:  [97.7 F (36.5 C)-98.3 F (36.8 C)] 98.3 F (36.8 C) (10/31 1051) Pulse Rate:  [90-108] 96  (10/31 1051) Resp:  [18-26] 18  (10/31 1051) BP: (84-115)/(35-73) 99/61 mmHg (10/31 1051) SpO2:  [90 %-96 %] 90 % (10/31 1051) Weight:  [171 lb 1.2 oz (77.6 kg)-175 lb 4.3 oz (79.5 kg)] 171 lb 1.2 oz (77.6 kg) (10/30 1800) Exam: General: obese adult female, in NAD and more cooperative with exam this morning than previous days Cardiovascular: RRR, normal S1, S2; heart sounds slightly distant secondary to body habitus  Respiratory: CTAB, rare expiratory wheeze  Abdomen: soft, nontender, obese; large midline scar; ~2 cm soft, easily reducible incisional hernia; BS+ Extremities: LE edema markedly improved, bilaterally; skin with chronic venous stasis changes Skin: RLE with two areas of chronic ulceration, one just below/lateral to knee, one above ankle  Bilateral scarred/healed areas of groin at previous bypass graft sites  Present on admission: Buttock ulcerations, now healing with skin barrier cream in place  Laboratory:  Lab 06/25/12 1227 06/24/12 0430 06/23/12 2149  WBC 17.4* 21.4* 26.2*  HGB 9.2* 9.6* 9.9*  HCT 29.4* 30.9* 31.0*  PLT 180 184 179    Lab 06/26/12 0859 06/25/12 0915 06/24/12 0430  NA 139 132* 140  K 3.3* 4.4 4.0  CL 101 101 104  CO2 27 19 25    BUN 12 18 8   CREATININE 2.01* 2.35* 1.20*  CALCIUM 9.3 9.1 8.8  PROT -- -- 6.0  BILITOT -- -- 0.2*  ALKPHOS -- -- 175*  ALT -- -- 53*  AST -- -- 21  GLUCOSE 199* 192* 151*    Basename 06/26/12 0810 06/25/12 2141 06/25/12 1829  GLUCAP 193* 243* 106*   CXR, 10/22 @1132  Findings: Cardiomegaly is noted. Stable dual lumen right IJ  dialysis catheter position. Central mild vascular congestion.  Mild interstitial prominence bilaterally. Mild interstitial edema  cannot be excluded. No focal infiltrate.  IMPRESSION:  Stable dual lumen right IJ dialysis catheter position. Central  mild vascular congestion. Mild interstitial prominence  bilaterally. Mild interstitial edema cannot be excluded. No focal  infiltrate.  Assessment and Plan: CARYLL SARPONG is a 41 y.o. year old female with a complicated PMH significant for multiple vascular procedures, generally poorly controlled DM type II also on insulin, ESRD on HD (TThS in [place], [place]), CAD/previous MI s/p stenting, presenting with elevated blood sugars after coming to the hospital for planned HD catheter change and pre-treatment with prednisone for contrast allergy. Now stable for discharge home; not requiring IV, tolerating PO well, HD went well, yesterday.   1. DM type II - Hx of poor compliance with outpt insulin/CBG regimen. CBG's better controlled with subQ insulin. Plan - Will resume home dosing of Lantus and Novolog at discharge.  2. ESRD on HD - On TThS schedule in [place], [place]. Tolerated HD well 10/30. Plan - HD management per  nephrology service. Assistance greatly appreciated. Tolerated intermittent HD, per Dr. Darrick Penna okay to resume home schedule after discharge.  3. HTN -  BP's improved after dialysis/CRRT.  Plan - No need for antihypertensive meds at this point. Needs monitoring as an outpt.  4. Cellulitis of LLE +/- chronic venous stasis - Cellulitis appears cleared, s/p abx this admission continued from last admission. WBC  elevated but trending down, pt remains afebrile.  5. Skin care - Buttock ulcers present on admission, doing well with local care. Plan - Bactroban with local wound care.  6. CAD/PAD with history of MI - Multiple vascular procedures. Echo in 01/2012 showed EF of 60-65% with normal systolic function and mild LVH.  Plan - Continue home Plavix as an outpt.   7. Bipolar disorder/anxiety/chronic pain - Continue home Paxil, Abilify. Xanax PRN (half home dose). Percocet PRN (half home dose). Restart home doses after discharge.  8. GERD - Protonix to replace home omeprazole, to resume at discharge.   9. Hx multiple eye surgeries - Continue home eyedrops (Polysporin, ofloxacin).   10. Hx of migraines - No current headaches. Fioricet PRN; currently holding, to likely be restarted at discharge.  11. Seasonal allergies - Continue home Flonase at d/c.   #Apparent Somnolence  - Resolved. Pt alert and more interactive than on admission.  #History of OSA - Adamantly refuses CPAP. Xanax and Percocet reduced while admitted, to continue after discharge.  FEN/GI: Dysphagia 3 diet, Nepro supplements with meals Prophylaxis/PRN: SubQ heparin/anticoagulation per HD protocols. Disposition: Pt has had palliative care consult and was DNR/DNI but has now requested to be full code again. Pt to be discharged home with [facility] PT, RN, and social work to discuss possible SNF placement in the future, but pt refuses SNF for now. Will order [facility] equipment per PT order. Code Status: Full  Lysa Livengood, Cristal Deer, MD 06/26/2012, 12:34 PM

## 2012-06-26 NOTE — Progress Notes (Signed)
Subjective: Interval History: has complaints doing better today.  Objective: Vital signs in last 24 hours: Temp:  [97.7 F (36.5 C)-98.2 F (36.8 C)] 97.7 F (36.5 C) (10/31 0457) Pulse Rate:  [90-108] 101  (10/31 0457) Resp:  [18-26] 18  (10/31 0457) BP: (84-115)/(35-73) 108/58 mmHg (10/31 0457) SpO2:  [93 %-96 %] 95 % (10/31 0457) Weight:  [77.6 kg (171 lb 1.2 oz)-79.5 kg (175 lb 4.3 oz)] 77.6 kg (171 lb 1.2 oz) (10/30 1800) Weight change:   Intake/Output from previous day: 10/30 0701 - 10/31 0700 In: 370 [P.O.:240; I.V.:120; IV Piggyback:10] Out: 1230  Intake/Output this shift:    General appearance: cooperative, pale and slowed mentation Resp: diminished breath sounds bilaterally, rales bibasilar and rhonchi bilaterally Cardio: S1, S2 normal and systolic murmur: systolic ejection 2/6, decrescendo at 2nd left intercostal space GI: pos bs,liver down 4 cm, tender Extremities: edema 1+, ischemic changes and healing ulcer on L  Lab Results:  Basename 06/25/12 1227 06/24/12 0430  WBC 17.4* 21.4*  HGB 9.2* 9.6*  HCT 29.4* 30.9*  PLT 180 184   BMET:  Basename 06/25/12 0915 06/24/12 0430  NA 132* 140  K 4.4 4.0  CL 101 104  CO2 19 25  GLUCOSE 192* 151*  BUN 18 8  CREATININE 2.35* 1.20*  CALCIUM 9.1 8.8   No results found for this basename: PTH:2 in the last 72 hours Iron Studies: No results found for this basename: IRON,TIBC,TRANSFERRIN,FERRITIN in the last 72 hours  Studies/Results: No results found.  I have reviewed the patient's current medications.  Assessment/Plan: 1 CRF for HD in am.  If goes home,will need to communicate regimen to home unit in [place] 2 Dm per primary 3 PVD severe 4 EOL expectancy short.  Backed out on stopping HD.  Anxiety and Depression.  Outlook poor 5 Nonadherence makes outcomes much worse and will limit her short life P hd, epo.fe mobilize    LOS: 9 days   Rosaly Labarbera L 06/26/2012,9:43 AM

## 2012-06-26 NOTE — Progress Notes (Addendum)
Patient WU:[id number] B [name]      DOB: 27-Nov-1970      YNW:[id number]   Palliative Medicine Team at [facility] Progress Note    Subjective: Patient sitting up in bed, states she is feeling much better overall, less dysphagia. Respirations unlabored, appears comfortable.   Filed Vitals:   06/26/12 0457  BP: 108/58  Pulse: 101  Temp: 97.7 F (36.5 C)  Resp: 18   Physical exam: General: Alert, voice with more clarity and volume HEENT: conjunctivae clear, buccal mucosa moist, free of lesions CHEST: CTA bilaterally, diminished at bases, much improved over past 48 hours CVS: RRR ABD: non-tender, BS audible EXT: multiple ares of superficial skin breakdown lower legs and feet NEURO: affect calm, oriented x 3   Assessment and plan: Patient's dysphagia improving, pulmonary congestion improved. Wants to continue HD and aggressive measures for acute issues. Stated she is fearful of dying, but gets so tired of continuing aggressive treatment at times.   1) Code Status: Full 2) Anxiety:Aprazolam as needed 3) Pain: c/o residual pain in L chest wall and central chest, slight bruising noted on L chest, suspect residual bruising from recent resuscitation. Also has chronic bilateral leg pain. Percocet total in 24 hrs/40 mg (Oxycodone). States effective in relieving pain.  4) Dysphagia: improving, patient complying with SLP recommendations 5) Bowel Regime: continues to have loose stools , most likely from antibiotic therapy, will add Dulcolax as needed due to chronic opioid use. 5) Disposition: patient plan to return home upon discharge, and resume CAP services support  Time In Time Out Total Time Spent with Patient Total Overall Time  8:30a 9:00a 30 min 30 min   [name], CNS-C Palliative Medicine Team [facility] Team Phone: [phone] Pager: [phone]

## 2012-06-27 ENCOUNTER — Inpatient Hospital Stay (HOSPITAL_COMMUNITY): Payer: Medicare Other

## 2012-06-27 LAB — RENAL FUNCTION PANEL
Albumin: 1.8 g/dL — ABNORMAL LOW (ref 3.5–5.2)
BUN: 25 mg/dL — ABNORMAL HIGH (ref 6–23)
Creatinine, Ser: 3.47 mg/dL — ABNORMAL HIGH (ref 0.50–1.10)
GFR calc non Af Amer: 15 mL/min — ABNORMAL LOW (ref >90)
Phosphorus: 5.1 mg/dL — ABNORMAL HIGH (ref 2.3–4.6)

## 2012-06-27 LAB — COMPREHENSIVE METABOLIC PANEL
Albumin: 1.7 g/dL — ABNORMAL LOW (ref 3.5–5.2)
Alkaline Phosphatase: 168 U/L — ABNORMAL HIGH (ref 39–117)
BUN: 30 mg/dL — ABNORMAL HIGH (ref 6–23)
CO2: 27 mEq/L (ref 19–32)
Chloride: 98 mEq/L (ref 96–112)
GFR calc non Af Amer: 14 mL/min — ABNORMAL LOW (ref >90)
Glucose, Bld: 268 mg/dL — ABNORMAL HIGH (ref 70–99)
Potassium: 3.2 mEq/L — ABNORMAL LOW (ref 3.5–5.1)
Total Bilirubin: 0.1 mg/dL — ABNORMAL LOW (ref 0.3–1.2)

## 2012-06-27 LAB — CBC
HCT: 26.3 % — ABNORMAL LOW (ref 36.0–46.0)
Hemoglobin: 7.9 g/dL — ABNORMAL LOW (ref 12.0–15.0)
RBC: 2.65 MIL/uL — ABNORMAL LOW (ref 3.87–5.11)
WBC: 9.8 10*3/uL (ref 4.0–10.5)

## 2012-06-27 LAB — GLUCOSE, CAPILLARY: Glucose-Capillary: 131 mg/dL — ABNORMAL HIGH (ref 70–99)

## 2012-06-27 LAB — PHOSPHORUS: Phosphorus: 5 mg/dL — ABNORMAL HIGH (ref 2.3–4.6)

## 2012-06-27 MED ORDER — ALPRAZOLAM 1 MG PO TABS: 1.0000 mg | ORAL_TABLET | Freq: Three times a day (TID) | ORAL | Status: AC | PRN

## 2012-06-27 MED ORDER — BUTALBITAL-APAP-CAFFEINE 50-325-40 MG PO TABS: 1.0000 | ORAL_TABLET | Freq: Three times a day (TID) | ORAL | Status: AC | PRN

## 2012-06-27 MED ORDER — DARBEPOETIN ALFA-POLYSORBATE 150 MCG/0.3ML IJ SOLN
INTRAMUSCULAR | Status: AC
  Administered 2012-06-27: 150 ug via INTRAVENOUS
  Filled 2012-06-27: qty 0.3

## 2012-06-27 MED ORDER — OXYCODONE-ACETAMINOPHEN 10-325 MG PO TABS: 1.0000 | ORAL_TABLET | ORAL | Status: DC | PRN

## 2012-06-27 MED ORDER — BUTALBITAL-APAP-CAFFEINE 50-325-40 MG PO TABS: 1.0000 | ORAL_TABLET | Freq: Three times a day (TID) | ORAL | Status: DC

## 2012-06-27 MED ORDER — NEPRO PO LIQD: 237.0000 mL | Freq: Three times a day (TID) | ORAL | Status: AC | PRN

## 2012-06-27 MED ORDER — OXYCODONE-ACETAMINOPHEN 5-325 MG PO TABS
ORAL_TABLET | ORAL | Status: AC
  Administered 2012-06-27: 2 via ORAL
  Filled 2012-06-27: qty 2

## 2012-06-27 NOTE — Progress Notes (Signed)
Physical Therapy Treatment Patient Details Name: [name] MRN: [id number] DOB: 1970-12-17 Today's Date: 06/27/2012 Time: 0757-0820 PT Time Calculation (min): 23 min  PT Assessment / Plan / Recommendation Comments on Treatment Session  Pt. making gradual gains and continues to need SNF for rehab before home.    Follow Up Recommendations  Post acute inpatient     Does the patient have the potential to tolerate intense rehabilitation     Barriers to Discharge        Equipment Recommendations  Tub/shower bench;Wheelchair (measurements);Wheelchair cushion (measurements);3 in 1 bedside comode;Other (comment)    Recommendations for Other Services    Frequency Min 3X/week   Plan Discharge plan remains appropriate    Precautions / Restrictions Precautions Precautions: Fall Restrictions Weight Bearing Restrictions: No   Pertinent Vitals/Pain Denies pain, reports nausea; RN notified    Mobility  Bed Mobility Bed Mobility: Rolling Right;Right Sidelying to Sit Rolling Right: 4: Min assist;With rail Right Sidelying to Sit: 3: Mod assist;With rails Sit to Sidelying Right: Not Tested (comment) Details for Bed Mobility Assistance: Pt. asking for help prior to attempting to assist herself.  Encouraged pt to assist self as much as possible for her overall benefit and strengthening and that therapist would assist as she needed. Transfers Transfers: Sit to Stand;Stand to Sit Sit to Stand: 3: Mod assist;With upper extremity assist;From bed Stand to Sit: 3: Mod assist;With armrests;To chair/3-in-1 Details for Transfer Assistance: Pt. needed vc's for safety and technique Ambulation/Gait Ambulation/Gait Assistance: 3: Mod assist Ambulation Distance (Feet): 5 Feet Assistive device: Rolling walker Ambulation/Gait Assistance Details: cues for use use of RW and for safety Gait Pattern: Step-through pattern    Exercises     PT Diagnosis:    PT Problem List:   PT Treatment  Interventions:     PT Goals Acute Rehab PT Goals PT Goal: Supine/Side to Sit - Progress: Progressing toward goal PT Goal: Sit to Stand - Progress: Progressing toward goal PT Goal: Stand to Sit - Progress: Progressing toward goal PT Goal: Ambulate - Progress: Progressing toward goal  Visit Information  Last PT Received On: 06/27/12 Assistance Needed: +1    Subjective Data  Subjective: I feel nauseated   Cognition  Overall Cognitive Status: History of cognitive impairments - at baseline Area of Impairment: Problem solving;Other (comment) Arousal/Alertness: Awake/alert Orientation Level: Appears intact for tasks assessed Behavior During Session: [facility] for tasks performed Safety/Judgement: Decreased awareness of need for assistance    Balance     End of Session PT - End of Session Equipment Utilized During Treatment: Gait belt Activity Tolerance: Patient limited by fatigue Patient left: in chair;with call bell/phone within reach Nurse Communication: Mobility status   GP     Ferman Hamming 06/27/2012, 10:26 AM Weldon Picking PT Acute Rehab Services [phone] Beeper [phone]

## 2012-06-27 NOTE — Progress Notes (Signed)
FMTS Attending Daily Note: [name] Kronick MD [phone] pager office [phone] I  have seen and examined this patient, reviewed their chart. I have discussed this patient with the resident. I agree with the resident's findings, assessment and care plan. 

## 2012-06-27 NOTE — Progress Notes (Signed)
Discharge plans for home health services  [facility] faxed orders for RN, PT, OT, SLP and SW  Oxygen: [facility] faxed orders

## 2012-06-27 NOTE — Progress Notes (Signed)
Family Medicine Teaching Service Daily Progress Note Intern Pager: [phone]  Patient name: [name] Medical record number: [id number] Date of birth: 25-May-1971 Age: 41 y.o. Gender: female  Primary Care Provider: Ernestine Conrad, MD  Subjective: Pt seen at bedside this morning. Did not sleep well last night but otherwise has no new complaints; still some cough/sore throat from intubation in the ICU, but no sputum/production. Waiting for discharge. Family not present this morning, but will be back later today.  Objective: Temp:  [97.8 F (36.6 C)-98.6 F (37 C)] 98.3 F (36.8 C) (11/01 0531) Pulse Rate:  [87-105] 105  (11/01 0531) Resp:  [18-20] 20  (11/01 0531) BP: (99-126)/(61-73) 116/71 mmHg (11/01 0531) SpO2:  [90 %-93 %] 92 % (11/01 0531) Weight:  [174 lb 9.7 oz (79.2 kg)] 174 lb 9.7 oz (79.2 kg) (10/31 2207) Exam: General: obese adult female, sitting up in chair, in NAD but appears tired Cardiovascular: RRR, normal S1, S2; sounds soft due to body habitus but no murmur appreciated Respiratory: CTAB, occasional expiratory wheeze/cough with deep breaths Abdomen: soft, nontender, obese; large midline scar; ~2 cm soft, easily reducible incisional hernia; BS+ Extremities: LE without significant edema, bilaterally, but tender to palpation to assess for edema (unchanged from baseline); skin with chronic venous stasis changes, slightly worse on left Skin: RLE with two areas of chronic/healed ulceration, one just below/lateral to knee, one above ankle  Bilateral scarred/healed areas of groin at previous bypass graft sites  Present on admission: Buttock ulcerations, various stages of healing  Laboratory:  Lab 06/25/12 1227 06/24/12 0430 06/23/12 2149  WBC 17.4* 21.4* 26.2*  HGB 9.2* 9.6* 9.9*  HCT 29.4* 30.9* 31.0*  PLT 180 184 179    Lab 06/26/12 0859 06/25/12 0915 06/24/12 0430  NA 139 132* 140  K 3.3* 4.4 4.0  CL 101 101 104  CO2 27 19 25   BUN 12 18 8   CREATININE 2.01*  2.35* 1.20*  CALCIUM 9.3 9.1 8.8  PROT -- -- 6.0  BILITOT -- -- 0.2*  ALKPHOS -- -- 175*  ALT -- -- 53*  AST -- -- 21  GLUCOSE 199* 192* 151*    Basename 06/27/12 0730 06/26/12 2208 06/26/12 1659  GLUCAP 213* 178* 184*   Assessment and Plan: LAYNA ROEPER is a 41 y.o. year old female with a complicated PMH significant for multiple vascular procedures, generally poorly controlled DM type II also on insulin, ESRD on HD (TThS in [place], [place]), CAD/previous MI s/p stenting, presenting with elevated blood sugars after coming to the hospital for planned HD catheter change and pre-treatment with prednisone for contrast allergy. Stable, tolerating PO well. To go for HD this morning. Awaiting SNF placement.   1. DM type II - Hx of poor compliance with outpt insulin/CBG regimen. CBG's better controlled during admission with subQ insulin. Plan - Lantus and SSI, CBG's qAC+HS. Will resume home dosing of Lantus and Humalog at discharge.  2. ESRD on HD - On TThS schedule in [place], [place]. Tolerated HD well 10/30. Plan - HD management per nephrology service. Assistance greatly appreciated. To go for HD this morning 11/1. Per Dr. Darrick Penna okay to resume home schedule after discharge. Nephrology service has contacted home HD unit with new parameters/weights/etc.  3. HTN -  BP's improved after dialysis/CRRT.  Plan - No need for antihypertensive meds at this point. Needs monitoring as an outpt.  4. Cellulitis of LLE +/- chronic venous stasis - Cellulitis appears cleared, s/p abx this admission continued from last admission.  WBC elevated as of 10/31 but trending down, pt remains afebrile.  5. Skin care - Buttock ulcers present on admission, doing well with local care. Plan - Bactroban with local wound care.  6. CAD/PAD with history of MI - Multiple vascular procedures. Echo in 01/2012 showed EF of 60-65% with normal systolic function and mild LVH. Plan - Continue home Plavix.   7. Bipolar  disorder/anxiety/chronic pain - Continue home Paxil, Abilify at normal doses. Xanax PRN. Percocet PRN (half home dose). Continue after discharge.  8. GERD - Protonix to replace home omeprazole, to resume at discharge.   9. Hx multiple eye surgeries - Continue home eyedrops (Polysporin, ofloxacin).   10. Hx of migraines - No current headaches. Fioricet PRN; currently holding, to likely be restarted at discharge.  11. Seasonal allergies - Continue home Flonase at d/c.   #Apparent Somnolence  - Resolved. Pt alert and more interactive than on admission.  #History of OSA - Adamantly refuses CPAP. Xanax and Percocet reduced while admitted, to continue after discharge.  FEN/GI: Dysphagia 3 diet, Nepro supplements with meals Prophylaxis/PRN: SubQ heparin/anticoagulation per HD protocols. Disposition: Pt has had palliative care consult and was DNR/DNI but has now requested to be full code again. Pt initially refused SNF placement 10/31 and was to be discharged home, but later agreed and CSW is currently searching for beds. Assistance is much appreciated. Code Status: Full  Ryelee Albee, Cristal Deer, MD 06/27/2012, 9:07 AM

## 2012-06-27 NOTE — Progress Notes (Signed)
Patient stood up from chair to walker with O2 sats dropping to 84%. O2 sats improved to mid 90's with rest and 2L O2 [place]  Marena Chancy, PGY-2 Family Medicine Resident

## 2012-06-27 NOTE — Clinical Social Work Placement (Signed)
Clinical Social Work Department CLINICAL SOCIAL WORK PLACEMENT NOTE 06/27/2012  Patient:  [name], [name]  Account Number:  [id number] Admit date:  06/17/2012  Clinical Social Worker:  Genelle Bal, LCSW  Date/time:  06/27/2012 07:53 AM  Clinical Social Work is seeking post-discharge placement for this patient at the following level of care:   SKILLED NURSING   (*CSW will update this form in Epic as items are completed)   06/26/2012  Patient/family provided with [facility] Department of Clinical Social Work's list of facilities offering this level of care within the geographic area requested by the patient (or if unable, by the patient's family).  06/26/2012  Patient/family informed of their freedom to choose among providers that offer the needed level of care, that participate in Medicare, Medicaid or managed care program needed by the patient, have an available bed and are willing to accept the patient.    Patient/family informed of [facility]' ownership interest in [facility], as well as of the fact that they are under no obligation to receive care at this facility.  PASARR submitted to EDS on 06/27/2012 PASARR number received from EDS on 06/27/2012  FL2 transmitted to all facilities in geographic area requested by pt/family on  06/26/2012 FL2 transmitted to all facilities within larger geographic area on   Patient informed that his/her managed care company has contracts with or will negotiate with  certain facilities, including the following:     Patient/family informed of bed offers received:  06/27/2012 Patient chooses bed at [facility] then decision made to discharge home. Physician recommends and patient chooses bed at    Patient to be transferred home by family on 06/28/12  Patient to be transferred to facility by   The following physician request were entered in Epic:   Additional Comments: 06/27/12: CSW contacted [facility] and they did not have  a bed. [facility] responded no. CSW contacted [facility] and they said no. Family contacted and with a lot of deliberation, they decided on [facility].  CSW was later by patient's sister and advised that they would take patient home. CSW talked at length with patient and family again at the bedside regarding the decision to take patient home and sister stated that there are additional family members who will be on hand to assist with patient's care. Patient was elated to be going home. Per sister Lupita Leash, she will be in touch with [facility] and the plan is for her to still go there once a bed becomes available. RN case manager became involved to set-up home health.  CSW signing off as patient will discharge home on Saturday, 11/2 (oxygen can't be delivered until very 1 am Saturday morning)

## 2012-06-27 NOTE — Clinical Social Work Psychosocial (Signed)
Clinical Social Work Department BRIEF PSYCHOSOCIAL ASSESSMENT 06/27/2012  Patient:  [name], [name]     Account Number:  [id number]     Admit date:  06/17/2012  Clinical Social Worker:  Delmer Islam  Date/Time:  06/27/2012 07:45 AM  Referred by:  Physician  Date Referred:  06/26/2012 Referred for  SNF Placement   Other Referral:   Interview type:  Patient Other interview type:   Also talked with patient's mother, [name] and sister [name] who were at the bedside on 06/26/12.    PSYCHOSOCIAL DATA Living Status:  FAMILY Admitted from facility:   Level of care:   Primary support name:  [name] Primary support relationship to patient:  SIBLING Degree of support available:   Patient's mother is also very supportive and assists as she can with patient's care. CSW informed that she drives patient to dialysis.    CURRENT CONCERNS Current Concerns  Post-Acute Placement   Other Concerns:    SOCIAL WORK ASSESSMENT / PLAN CSW talked with patient and family on 06/26/12 about discharge plans and the need for ST rehab prior to patient returning home. Patient and family in agreement and their preferences in SNF's are [facility] and second [address]   Assessment/plan status:  Psychosocial Support/Ongoing Assessment of Needs Other assessment/ plan:   Information/referral to community resources:    PATIENT'S/FAMILY'S RESPONSE TO PLAN OF CARE: Patient and family appreciative of CSW assistance. They were advised that CSW would follow-up with them regarding responses from the facilities.

## 2012-06-27 NOTE — Discharge Summary (Signed)
Family Medicine Teaching Service  Discharge Note : Attending Lanore Renderos MD Pager [phone] Office [phone] I have seen and examined this patient, reviewed their chart and discussed discharge planning wit the resident at the time of discharge. I agree with the discharge plan as above.  

## 2012-06-27 NOTE — Progress Notes (Signed)
FMTS Attending Daily Note: Adael Culbreath MD [phone] pager office [phone] I  have seen and examined this patient, reviewed their chart. I have discussed this patient with the resident. I agree with the resident's findings, assessment and care plan. 

## 2012-06-27 NOTE — Progress Notes (Signed)
Subjective: Interval History: none.  Objective: Vital signs in last 24 hours: Temp:  [97.6 F (36.4 C)-98.6 F (37 C)] 97.6 F (36.4 C) (11/01 0912) Pulse Rate:  [87-105] 102  (11/01 0912) Resp:  [18-20] 20  (11/01 0912) BP: (99-126)/(45-73) 105/45 mmHg (11/01 0912) SpO2:  [90 %-97 %] 97 % (11/01 0912) Weight:  [79.2 kg (174 lb 9.7 oz)] 79.2 kg (174 lb 9.7 oz) (10/31 2207) Weight change: -0.3 kg (-10.6 oz)  Intake/Output from previous day: 10/31 0701 - 11/01 0700 In: 240 [P.O.:240] Out: -  Intake/Output this shift:    General appearance: cooperative, moderately obese and slowed mentation Resp: diminished breath sounds bilaterally and rhonchi bibasilar Cardio: S1, S2 normal and systolic murmur: systolic ejection 2/6, decrescendo at 2nd left intercostal space GI: pos bs,mild tender, liver down 4 cm Extremities: edema 1 +. ishemic changes bilat LE and   Lab Results:  Basename 06/25/12 1227  WBC 17.4*  HGB 9.2*  HCT 29.4*  PLT 180   BMET:  Basename 06/27/12 0819 06/26/12 0859  NA 140 139  K 3.3* 3.3*  CL 99 101  CO2 26 27  GLUCOSE 222* 199*  BUN 25* 12  CREATININE 3.47* 2.01*  CALCIUM 9.7 9.3   No results found for this basename: PTH:2 in the last 72 hours Iron Studies: No results found for this basename: IRON,TIBC,TRANSFERRIN,FERRITIN in the last 72 hours  Studies/Results: No results found.  I have reviewed the patient's current medications.  Assessment/Plan: 1 ESRD for HD today.  Cath access, keep vol down.  2 anemia epo/fe 3 PVD  4 CAd 5 Nonadherence 6 DM per primary 7Smoking P HD, epo,     LOS: 10 days   Eugen Jeansonne L 06/27/2012,10:26 AM

## 2012-06-27 NOTE — Procedures (Signed)
I was present at this session.  I have reviewed the session itself and made appropriate changes.  Bp low 100s. Cath flow 400. Tol HD, up in chair  [name] L 11/1/20132:10 PM

## 2012-06-27 NOTE — Progress Notes (Signed)
Speech Language Pathology Dysphagia Treatment Patient Details Name: [name] MRN: [id number] DOB: 03/29/1971 Today's Date: 06/27/2012 Time: 1203-1223 SLP Time Calculation (min): 20 min  Assessment / Plan / Recommendation Clinical Impression  Pt seen again for reinforcement of precautions and strategies to reduce severity of aspiration. With min question cues pt was able to verbalize her diet, reason for diet and 50% of compensatory strategies. Even after discussing rationale pt requested thin water. SLP explained to pt that she would be at risk of aspiration if she drank thin liquids and that her decision had been to try and reduce aspiration. I explained that at the hospital she would have to follow these rules if she really wated to pursue more preventative care. Pt verbalized understanding. SLP provided min verbal cues with small sips of nectar thick liquids for strategies. Pt return demonstrated over multiple trials. Pt observed to have some hard coughing after 50% of intake, likely indicating probable aspiration.  High risk continues, pt attempting strategies. SLP will continue to follow.     Diet Recommendation  Continue with Current Diet: Dysphagia 2 (fine chop);Nectar-thick liquid    SLP Plan Continue with current plan of care   Pertinent Vitals/Pain NA   Swallowing Goals  SLP Swallowing Goals Patient will consume recommended diet without observed clinical signs of aspiration with: Moderate assistance Swallow Study Goal #1 - Progress: Progressing toward goal Patient will utilize recommended strategies during swallow to increase swallowing safety with: Supervision/safety Swallow Study Goal #2 - Progress: Progressing toward goal  General Temperature Spikes Noted: No Respiratory Status: Supplemental O2 delivered via (comment) Behavior/Cognition: Alert;Cooperative Oral Cavity - Dentition: Edentulous;Dentures, not available Patient Positioning: Upright in bed  Oral Cavity -  Oral Hygiene Does patient have any of the following "at risk" factors?: Diet - patient on thickened liquids;Other - dysphagia;Oxygen therapy - cannula, mask, simple oxygen devices Patient is AT RISK - Oral Care Protocol followed (see row info): Yes   Dysphagia Treatment Treatment focused on: Skilled observation of diet tolerance;Patient/family/caregiver education;Utilization of compensatory strategies Treatment Methods/Modalities: Skilled observation Patient observed directly with PO's: Yes Type of PO's observed: Nectar-thick liquids Feeding: Able to feed self Liquids provided via: Cup Pharyngeal Phase Signs & Symptoms: Wet vocal quality Type of cueing: Verbal Amount of cueing: Minimal   GO     Vian Fluegel, Riley Nearing 06/27/2012, 1:16 PM

## 2012-06-28 ENCOUNTER — Inpatient Hospital Stay (HOSPITAL_COMMUNITY): Payer: Medicare Other

## 2012-06-28 LAB — CULTURE, BLOOD (ROUTINE X 2): Culture: NO GROWTH

## 2012-06-28 LAB — RENAL FUNCTION PANEL
CO2: 25 mEq/L (ref 19–32)
Calcium: 9 mg/dL (ref 8.4–10.5)
Creatinine, Ser: 1.96 mg/dL — ABNORMAL HIGH (ref 0.50–1.10)
GFR calc non Af Amer: 30 mL/min — ABNORMAL LOW (ref >90)

## 2012-06-28 LAB — MAGNESIUM: Magnesium: 2.3 mg/dL (ref 1.5–2.5)

## 2012-06-28 LAB — GLUCOSE, CAPILLARY: Glucose-Capillary: 87 mg/dL (ref 70–99)

## 2012-06-28 MED ORDER — HEPARIN SODIUM (PORCINE) 1000 UNIT/ML DIALYSIS
40.0000 [IU]/kg | Freq: Once | INTRAMUSCULAR | Status: AC
  Administered 2012-06-28: 3100 [IU] via INTRAVENOUS_CENTRAL
  Filled 2012-06-28: qty 4

## 2012-06-28 MED ORDER — SODIUM CHLORIDE 0.9 % IV SOLN: 100.0000 mL | INTRAVENOUS | Status: DC | PRN

## 2012-06-28 MED ORDER — NEPRO/CARBSTEADY PO LIQD: 237.0000 mL | ORAL | Status: DC | PRN

## 2012-06-28 MED ORDER — PENTAFLUOROPROP-TETRAFLUOROETH EX AERO: 1.0000 "application " | INHALATION_SPRAY | CUTANEOUS | Status: DC | PRN

## 2012-06-28 MED ORDER — LIDOCAINE-PRILOCAINE 2.5-2.5 % EX CREA: 1.0000 "application " | TOPICAL_CREAM | CUTANEOUS | Status: DC | PRN

## 2012-06-28 MED ORDER — LIDOCAINE HCL (PF) 1 % IJ SOLN: 5.0000 mL | INTRAMUSCULAR | Status: DC | PRN

## 2012-06-28 MED ORDER — HEPARIN SODIUM (PORCINE) 1000 UNIT/ML DIALYSIS
1000.0000 [IU] | INTRAMUSCULAR | Status: DC | PRN
  Filled 2012-06-28: qty 1

## 2012-06-28 MED ORDER — ALTEPLASE 2 MG IJ SOLR
2.0000 mg | Freq: Once | INTRAMUSCULAR | Status: DC | PRN
  Filled 2012-06-28: qty 2

## 2012-06-28 NOTE — Progress Notes (Signed)
Subjective: Interval History: none.  Objective: Vital signs in last 24 hours: Temp:  [97 F (36.1 C)-99.1 F (37.3 C)] 97.9 F (36.6 C) (11/02 0401) Pulse Rate:  [89-97] 91  (11/02 0401) Resp:  [14-21] 18  (11/02 0401) BP: (87-120)/(43-69) 120/46 mmHg (11/02 0401) SpO2:  [90 %-100 %] 97 % (11/02 0401) Weight:  [77.3 kg (170 lb 6.7 oz)] 77.3 kg (170 lb 6.7 oz) (11/01 2133) Weight change: -1.9 kg (-4 lb 3 oz)  Intake/Output from previous day: 11/01 0701 - 11/02 0700 In: 0  Out: 1758  Intake/Output this shift:    General appearance: cooperative, pale and slowed mentation Resp: diminished breath sounds bilaterally and rhonchi bilaterally Cardio: S1, S2 normal and systolic murmur: systolic ejection 2/6, decrescendo at 2nd left intercostal space GI: pos bs, liver down 5 cm Extremities: 1+ edema, healing ulcer R calf  Lab Results:  [facility] 06/27/12 1405 06/25/12 1227  WBC 9.8 17.4*  HGB 7.9* 9.2*  HCT 26.3* 29.4*  PLT 253 180   BMET:  Basename 06/28/12 0530 06/27/12 1405  NA 139 139  K 3.9 3.2*  CL 102 98  CO2 25 27  GLUCOSE 198* 268*  BUN 16 30*  CREATININE 1.96* 3.65*  CALCIUM 9.0 9.3   No results found for this basename: PTH:2 in the last 72 hours Iron Studies: No results found for this basename: IRON,TIBC,TRANSFERRIN,FERRITIN in the last 72 hours  Studies/Results: No results found.  I have reviewed the patient's current medications.  Assessment/Plan: 1 CRF to be d/c, to go to [place] [facility] from here, given dry wgt. 2 DM per primary 3 PVD 4 Non adherence 5 Smoking encouraged to stay off P to [facility] HD    LOS: 11 days   Daanish Copes L 06/28/2012,9:26 AM

## 2012-06-28 NOTE — Progress Notes (Signed)
Discharge instructions reviewed with pt and family.  Pt and family verbalized understanding and had no questions.  Pt discharged in stable condition via wheelchair on 2L O2 with family.  HH has also been arranged.   Hector Shade Wanda

## 2012-06-28 NOTE — Progress Notes (Addendum)
06/28/2012 1730 NCM spoke to pt and instructed her to call [facility] as soon as she arrives home to deliver oxygen. DME rep provided pt with [facility] contact number. Instructed portable tank only last 3-4 hours. Unit RN put [facility] contact number on d/c instructions. Isidoro Donning RN CCM Case Mgmt phone [phone]

## 2012-06-28 NOTE — Progress Notes (Signed)
Family Medicine Teaching Service Daily Progress Note Intern Pager: [phone]  Patient name: [name] Medical record number: [id number] Date of birth: 05/07/1971 Age: 40 y.o. Gender: female  Primary Care Provider: Ernestine Conrad, MD  Subjective: Pt was to be discharged 10/31, then decided to go to SNF which was arranged yesterday, then canceled and want to go home again today. She ultimately is planning to go to a SNF in [place] where she lives. She reports continued diarrhea, negative C diff noted.   Objective: Temp:  [97 F (36.1 C)-99.1 F (37.3 C)] 97.9 F (36.6 C) (11/02 0401) Pulse Rate:  [89-102] 91  (11/02 0401) Resp:  [14-21] 18  (11/02 0401) BP: (87-120)/(43-69) 120/46 mmHg (11/02 0401) SpO2:  [90 %-100 %] 97 % (11/02 0401) Weight:  [170 lb 6.7 oz (77.3 kg)] 170 lb 6.7 oz (77.3 kg) (11/01 2133) Exam: General: obese adult female, NAD lying in bed. Cardiovascular: RRR, normal S1, S2; Respiratory: CTAB,  Abdomen: soft, nontender, obese; BS ++ Extremities: no LE edema. Skin dry,  Skin: dry legs.     Laboratory:  Lab 06/27/12 1405 06/25/12 1227 06/24/12 0430  WBC 9.8 17.4* 21.4*  HGB 7.9* 9.2* 9.6*  HCT 26.3* 29.4* 30.9*  PLT 253 180 184    Lab 06/28/12 0530 06/27/12 1405 06/27/12 0819 06/24/12 0430  NA 139 139 140 --  K 3.9 3.2* 3.3* --  CL 102 98 99 --  CO2 25 27 26  --  BUN 16 30* 25* --  CREATININE 1.96* 3.65* 3.47* --  CALCIUM 9.0 9.3 9.7 --  PROT -- 6.0 -- 6.0  BILITOT -- 0.1* -- 0.2*  ALKPHOS -- 168* -- 175*  ALT -- 17 -- 53*  AST -- 12 -- 21  GLUCOSE 198* 268* 222* --    Basename 06/28/12 0755 06/27/12 2131 06/27/12 1838  GLUCAP 106* 195* 77   Assessment and Plan: [name] is a 41 y.o. year old female with a complicated PMH significant for multiple vascular procedures, poorly controlled DM type II, ESRD on HD ([facility] in [place], [place]), CAD/previous MI s/p stenting, admitted for DKA with severe acidosis requiring ICU s/p code blue who has been  stable for discharge since 10/31, but cannot make up her mind about home vs. SNF. Plan to home today and to SNF in [place] in next few days.   1. DM type II - Hx of poor compliance with outpt insulin/CBG regimen. CBG's controlled. Plan - Lantus and SSI, CBG's qAC+HS. Will resume home dosing of Lantus and Humalog at discharge.  2. ESRD on HD - On TThS schedule in [place], [place].  Plan - HD management per nephrology service. Needs HD today, will give in hospital since she cannot get to her outpatient center before closing today. Per Dr. Darrick Penna okay to resume home schedule after discharge.   3. HTN -  BP's improved after dialysis. Plan -  monitoring as an outpt.  4. Cellulitis of LLE +/- chronic venous stasis - Cellulitis appears cleared, s/p abx this admission continued from last admission. WBC elevated as of 10/31 but trending down, pt remains afebrile. DC her central line today prior to DC.  5. Skin care - Buttock ulcers present on admission, doing well with local care. Plan - Bactroban with local wound care.  6. CAD/PAD with history of MI - Multiple vascular procedures. Echo in 01/2012 showed EF of 60-65% with normal systolic function and mild LVH. Plan - Continue home Plavix.   7. Bipolar disorder/anxiety/chronic pain -  Continue home Paxil, Abilify at normal doses. Xanax PRN. Percocet PRN (half home dose). Continue after discharge.  8. GERD - Protonix to replace home omeprazole, to resume at discharge.   9. Hx multiple eye surgeries - Continue home eyedrops (Polysporin, ofloxacin).   10. Hx of migraines - No current headaches. Fioricet PRN; currently holding, to likely be restarted at discharge.  11. Seasonal allergies - Continue home Flonase at d/c.   #History of OSA - Adamantly refuses CPAP. Xanax and Percocet reduced while admitted, to continue after discharge.  FEN/GI: Dysphagia 3 diet, Nepro supplements with meals  Prophylaxis/PRN: SubQ heparin/anticoagulation per HD  protocols. Disposition: Plan DC today after HD.   Pt initially refused SNF placement 10/31 and was to be discharged home, but later agreed and CSW searched for beds x 2days, patient now wants to go home with home health ([facility]) and plans to go to [facility] of her choosing. Assistance is much appreciated.  Code Status: [name], MD PGY-3 06/28/2012, 8:51 AM

## 2012-06-28 NOTE — Progress Notes (Signed)
FMTS Attending Daily Note: Denny Levy MD [phone] pager office [phone] I  have seen and examined this patient, reviewed their chart. I have discussed this patient with the resident. I agree with the resident's findings, assessment and care plan. Appreciate the very hard work of our tem in trying to meet the patient's desires.

## 2012-06-28 NOTE — Procedures (Signed)
I was present at this session.  I have reviewed the session itself and made appropriate changes.  HD via PC  Maddox Bratcher L 11/2/201311:41 AM

## 2012-06-28 NOTE — Progress Notes (Signed)
SATURATION QUALIFICATIONS:  Patient Saturations on Room Air at Rest = 86%  Patient Saturations on Room Air while Ambulating = 80%  Patient Saturations on 2 Liters of oxygen while Ambulating = 97%  Statement of medical necessity for home oxygen:

## 2012-06-28 NOTE — Progress Notes (Signed)
   CARE MANAGEMENT NOTE 06/28/2012  Patient:  [name], [name]   Account Number:  [id number]  Date Initiated:  06/19/2012  Documentation initiated by:  [facility]  Subjective/Objective Assessment:   ESRD - post code placed in ICU.     Action/Plan:   Anticipated DC Date:  06/26/2012   Anticipated DC Plan:  HOME W HOME HEALTH SERVICES         [facility] Choice  HOME HEALTH   Choice offered to / List presented to:  C-1 Patient   DME arranged  [facility]GEN      DME agency  [facility]     [facility] arranged  HH-1 RN  HH-2 PT  HH-3 OT  HH-5 SPEECH THERAPY  HH-6 SOCIAL WORKER      HH agency  [facility]   Status of service:  Completed, signed off Medicare Important Message given?   (If response is "NO", the following Medicare IM given date fields will be blank) Date Medicare IM given:   Date Additional Medicare IM given:    Discharge Disposition:  HOME W HOME HEALTH SERVICES  Per UR Regulation:  Reviewed for med. necessity/level of care/duration of stay  If discussed at Long Length of Stay Meetings, dates discussed:   06/24/2012    Comments:  06/28/2012 1700 Oxygen was set up with [facility]. Faxed d/c summary to [facility] for scheduled d/c home today. Isidoro Donning RN CCM Case Mgmt phone [phone]  06/27/2012 [address] RN, [place]  [phone] Patient plans to discharge to home tomorrow home health aide / [facility] RN, PT. Oxygen  [facility]/ Cleda Mccreedy called with referral and faxed home health order, H&P and DC summary [facility]/ faxed order for home O2  06-24-12 1:30pm Avie Arenas, RNBSN [phone] Palliative meeting yesterday - llimited code, does not want CPR or reintubation.  Wants HD as OP per palliative note, wants to go home.  Prior to admission is a patient with [facility] for PT and RN.  Talked with sister Lupita Leash - who lives with patient and mom  most of the time.  Discussed discharge options with hospice or HH - [facility]  Feel would propably go with [facility] if it came the time to take her home.  Patient would rather stay in hopsital and not go home.  Discussed that depending on how the patient progressed this may not be an option and they would need to take patient home or to hospice home or SNF.  Flatly refused hospice home or SNF at this time, but also state Mom could not handle if goes home.  CM will continue to follow progression for discharge planning.  06-23-12 9:30am Avie Arenas, RNBSN [phone] Continues on CRRT. off vent. Ltach candidate when on HD.  06-19-12 10:50am Avie Arenas, RNBSN [phone] Sister and mother in to see patient - updated.  CM will continue to follow.  Per previoius note, lives with mother and sister, active with [facility] and with [facility].

## 2012-06-29 NOTE — Discharge Summary (Signed)
Family Medicine Teaching Service  Discharge Note : Attending Juda Lajeunesse MD Pager [phone] Office [phone] I have seen and examined this patient, reviewed their chart and discussed discharge planning wit the resident at the time of discharge. I agree with the discharge plan as above.  

## 2012-07-07 ENCOUNTER — Other Ambulatory Visit (INDEPENDENT_AMBULATORY_CARE_PROVIDER_SITE_OTHER): Payer: Self-pay | Admitting: Internal Medicine

## 2012-07-16 ENCOUNTER — Encounter: Payer: Self-pay | Admitting: Vascular Surgery

## 2012-07-17 ENCOUNTER — Encounter: Payer: Self-pay | Admitting: Neurosurgery

## 2012-07-17 ENCOUNTER — Encounter (HOSPITAL_COMMUNITY): Payer: Self-pay | Admitting: Pharmacy Technician

## 2012-07-17 ENCOUNTER — Ambulatory Visit (INDEPENDENT_AMBULATORY_CARE_PROVIDER_SITE_OTHER): Payer: Medicare Other | Admitting: Neurosurgery

## 2012-07-17 ENCOUNTER — Other Ambulatory Visit: Payer: Self-pay | Admitting: *Deleted

## 2012-07-17 ENCOUNTER — Encounter (HOSPITAL_COMMUNITY): Payer: Self-pay | Admitting: *Deleted

## 2012-07-17 ENCOUNTER — Ambulatory Visit: Payer: Medicare Other | Admitting: Vascular Surgery

## 2012-07-17 VITALS — BP 107/63 | HR 100 | Resp 18 | Ht 64.0 in | Wt 156.0 lb

## 2012-07-17 DIAGNOSIS — N186 End stage renal disease: Secondary | ICD-10-CM

## 2012-07-17 DIAGNOSIS — T82898A Other specified complication of vascular prosthetic devices, implants and grafts, initial encounter: Secondary | ICD-10-CM | POA: Insufficient documentation

## 2012-07-17 MED ORDER — CEFUROXIME SODIUM 1.5 G IJ SOLR: 1.5000 g | INTRAMUSCULAR | Status: DC

## 2012-07-17 MED ORDER — DEXTROSE 5 % IV SOLN
1.5000 g | INTRAVENOUS | Status: AC
  Administered 2012-07-18: 1.5 g via INTRAVENOUS

## 2012-07-17 MED ORDER — DEXTROSE 5 % IV SOLN: 1.5000 g | INTRAVENOUS | Status: DC

## 2012-07-17 NOTE — Progress Notes (Signed)
Subjective:     Patient ID: [name], female   DOB: 04/07/1971, 41 y.o.   MRN: [id number]  HPI: 41-year-old female patient seen by various physicians in our practice due to the complicated vascular history. The patient called asking to be seen deep to a left forearm graft is infected. The patient was brought in for evaluation, she states this has been going on for a month. The patient was most recently in the hospital for cellulitis as well as other medical difficulties.   Review of Systems: 12 point review of systems is notable for the difficulties described above otherwise unremarkable     Objective:   Physical Exam: Afebrile, vital signs are stable, left forearm graft site has not been used in 2 years, it is red and is draining pus as well as blood. Dr. Fields examined the patient and explained to her she would need to have this removed since possible.     Assessment:     Infected left forearm graft  unused in 2 years.    Plan:     Per Dr. Fields the patient will be added to Dr. Dicksons surgery scheduled for tomorrow, 07/18/2012 for excision of this graft. The patient states understanding her followup will will be pending that procedure.  [name] ANP   Clinic M.D.: Fields      

## 2012-07-18 ENCOUNTER — Observation Stay (HOSPITAL_COMMUNITY)
Admission: RE | Admit: 2012-07-18 | Discharge: 2012-07-19 | Disposition: A | Discharge status: Home / Self Care | Payer: Medicare Other | Source: Ambulatory Visit | Attending: Vascular Surgery | Admitting: Vascular Surgery

## 2012-07-18 ENCOUNTER — Encounter (HOSPITAL_COMMUNITY): Payer: Self-pay | Admitting: Certified Registered"

## 2012-07-18 ENCOUNTER — Ambulatory Visit (HOSPITAL_COMMUNITY): Payer: Medicare Other

## 2012-07-18 ENCOUNTER — Encounter (HOSPITAL_COMMUNITY): Payer: Self-pay | Admitting: *Deleted

## 2012-07-18 ENCOUNTER — Ambulatory Visit (HOSPITAL_COMMUNITY): Payer: Medicare Other | Admitting: Certified Registered"

## 2012-07-18 ENCOUNTER — Encounter (HOSPITAL_COMMUNITY)
Admission: RE | Disposition: A | Discharge status: Home / Self Care | Payer: Self-pay | Source: Ambulatory Visit | Attending: Vascular Surgery

## 2012-07-18 DIAGNOSIS — Y849 Medical procedure, unspecified as the cause of abnormal reaction of the patient, or of later complication, without mention of misadventure at the time of the procedure: Secondary | ICD-10-CM | POA: Insufficient documentation

## 2012-07-18 DIAGNOSIS — T827XXA Infection and inflammatory reaction due to other cardiac and vascular devices, implants and grafts, initial encounter: Secondary | ICD-10-CM

## 2012-07-18 DIAGNOSIS — N2581 Secondary hyperparathyroidism of renal origin: Secondary | ICD-10-CM | POA: Insufficient documentation

## 2012-07-18 DIAGNOSIS — Z9115 Patient's noncompliance with renal dialysis: Secondary | ICD-10-CM | POA: Insufficient documentation

## 2012-07-18 DIAGNOSIS — T82898A Other specified complication of vascular prosthetic devices, implants and grafts, initial encounter: Secondary | ICD-10-CM

## 2012-07-18 DIAGNOSIS — G4730 Sleep apnea, unspecified: Secondary | ICD-10-CM | POA: Insufficient documentation

## 2012-07-18 DIAGNOSIS — Z8614 Personal history of Methicillin resistant Staphylococcus aureus infection: Secondary | ICD-10-CM | POA: Insufficient documentation

## 2012-07-18 DIAGNOSIS — Y832 Surgical operation with anastomosis, bypass or graft as the cause of abnormal reaction of the patient, or of later complication, without mention of misadventure at the time of the procedure: Secondary | ICD-10-CM | POA: Insufficient documentation

## 2012-07-18 DIAGNOSIS — D631 Anemia in chronic kidney disease: Secondary | ICD-10-CM | POA: Insufficient documentation

## 2012-07-18 DIAGNOSIS — Z86711 Personal history of pulmonary embolism: Secondary | ICD-10-CM | POA: Insufficient documentation

## 2012-07-18 DIAGNOSIS — Z86718 Personal history of other venous thrombosis and embolism: Secondary | ICD-10-CM | POA: Insufficient documentation

## 2012-07-18 DIAGNOSIS — Z91158 Patient's noncompliance with renal dialysis for other reason: Secondary | ICD-10-CM | POA: Insufficient documentation

## 2012-07-18 DIAGNOSIS — I120 Hypertensive chronic kidney disease with stage 5 chronic kidney disease or end stage renal disease: Secondary | ICD-10-CM | POA: Insufficient documentation

## 2012-07-18 DIAGNOSIS — Z9981 Dependence on supplemental oxygen: Secondary | ICD-10-CM | POA: Insufficient documentation

## 2012-07-18 DIAGNOSIS — Z992 Dependence on renal dialysis: Secondary | ICD-10-CM | POA: Insufficient documentation

## 2012-07-18 DIAGNOSIS — F319 Bipolar disorder, unspecified: Secondary | ICD-10-CM | POA: Insufficient documentation

## 2012-07-18 DIAGNOSIS — N186 End stage renal disease: Secondary | ICD-10-CM | POA: Insufficient documentation

## 2012-07-18 DIAGNOSIS — N039 Chronic nephritic syndrome with unspecified morphologic changes: Secondary | ICD-10-CM | POA: Insufficient documentation

## 2012-07-18 DIAGNOSIS — E119 Type 2 diabetes mellitus without complications: Secondary | ICD-10-CM | POA: Insufficient documentation

## 2012-07-18 DIAGNOSIS — E785 Hyperlipidemia, unspecified: Secondary | ICD-10-CM | POA: Insufficient documentation

## 2012-07-18 DIAGNOSIS — I2510 Atherosclerotic heart disease of native coronary artery without angina pectoris: Secondary | ICD-10-CM | POA: Insufficient documentation

## 2012-07-18 HISTORY — PX: AVGG REMOVAL: SHX5153

## 2012-07-18 HISTORY — DX: Dependence on supplemental oxygen: Z99.81

## 2012-07-18 HISTORY — DX: Sleep apnea, unspecified: G47.30

## 2012-07-18 HISTORY — PX: PATCH ANGIOPLASTY: SHX6230

## 2012-07-18 LAB — GLUCOSE, CAPILLARY
Glucose-Capillary: 83 mg/dL (ref 70–99)
Glucose-Capillary: 96 mg/dL (ref 70–99)

## 2012-07-18 LAB — POCT I-STAT, CHEM 8
Glucose, Bld: 93 mg/dL (ref 70–99)
HCT: 29 % — ABNORMAL LOW (ref 36.0–46.0)
Hemoglobin: 9.9 g/dL — ABNORMAL LOW (ref 12.0–15.0)
Potassium: 4.1 mEq/L (ref 3.5–5.1)
Sodium: 137 mEq/L (ref 135–145)

## 2012-07-18 SURGERY — REMOVAL OF ARTERIOVENOUS GORETEX GRAFT (AVGG)
Anesthesia: General | Site: Arm Upper | Laterality: Left | Wound class: Dirty or Infected

## 2012-07-18 MED ORDER — SODIUM CHLORIDE 0.9 % IV SOLN: INTRAVENOUS | Status: DC

## 2012-07-18 MED ORDER — LIDOCAINE-EPINEPHRINE (PF) 1 %-1:200000 IJ SOLN
INTRAMUSCULAR | Status: DC | PRN
  Administered 2012-07-18: 10 mL

## 2012-07-18 MED ORDER — OXYCODONE-ACETAMINOPHEN 10-325 MG PO TABS: 1.0000 | ORAL_TABLET | ORAL | Status: DC | PRN

## 2012-07-18 MED ORDER — ARTIFICIAL TEARS OP OINT
TOPICAL_OINTMENT | OPHTHALMIC | Status: DC | PRN
  Administered 2012-07-18: 1 via OPHTHALMIC

## 2012-07-18 MED ORDER — PROPOFOL 10 MG/ML IV BOLUS
INTRAVENOUS | Status: DC | PRN
  Administered 2012-07-18: 120 mg via INTRAVENOUS

## 2012-07-18 MED ORDER — PANTOPRAZOLE SODIUM 40 MG PO TBEC: 40.0000 mg | DELAYED_RELEASE_TABLET | Freq: Every day | ORAL | Status: DC

## 2012-07-18 MED ORDER — METOPROLOL TARTRATE 1 MG/ML IV SOLN
2.0000 mg | INTRAVENOUS | Status: DC | PRN
  Filled 2012-07-18: qty 5

## 2012-07-18 MED ORDER — BUTALBITAL-APAP-CAFFEINE 50-325-40 MG PO TABS: 1.0000 | ORAL_TABLET | Freq: Three times a day (TID) | ORAL | Status: DC | PRN

## 2012-07-18 MED ORDER — PHENOL 1.4 % MT LIQD
1.0000 | OROMUCOSAL | Status: DC | PRN
  Filled 2012-07-18: qty 177

## 2012-07-18 MED ORDER — FLUTICASONE PROPIONATE 50 MCG/ACT NA SUSP
1.0000 | Freq: Every day | NASAL | Status: DC
  Filled 2012-07-18: qty 16

## 2012-07-18 MED ORDER — ALPRAZOLAM 0.5 MG PO TABS: 1.0000 mg | ORAL_TABLET | Freq: Three times a day (TID) | ORAL | Status: DC | PRN

## 2012-07-18 MED ORDER — SODIUM CHLORIDE 0.9 % IR SOLN
Status: DC | PRN
  Administered 2012-07-18: 15:00:00

## 2012-07-18 MED ORDER — FENTANYL CITRATE 0.05 MG/ML IJ SOLN
INTRAMUSCULAR | Status: DC | PRN
  Administered 2012-07-18 (×2): 50 ug via INTRAVENOUS

## 2012-07-18 MED ORDER — PROTAMINE SULFATE 10 MG/ML IV SOLN
INTRAVENOUS | Status: DC | PRN
  Administered 2012-07-18: 20 mg via INTRAVENOUS
  Administered 2012-07-18: 10 mg via INTRAVENOUS

## 2012-07-18 MED ORDER — PROMETHAZINE HCL 25 MG PO TABS: 25.0000 mg | ORAL_TABLET | Freq: Four times a day (QID) | ORAL | Status: DC | PRN

## 2012-07-18 MED ORDER — SEVELAMER CARBONATE 800 MG PO TABS
4000.0000 mg | ORAL_TABLET | Freq: Three times a day (TID) | ORAL | Status: DC
  Filled 2012-07-18 (×4): qty 5

## 2012-07-18 MED ORDER — FUROSEMIDE 40 MG PO TABS
40.0000 mg | ORAL_TABLET | Freq: Every day | ORAL | Status: DC
  Filled 2012-07-18 (×2): qty 1

## 2012-07-18 MED ORDER — OXYCODONE-ACETAMINOPHEN 10-325 MG PO TABS
1.0000 | ORAL_TABLET | ORAL | Status: AC | PRN
Start: 2012-07-18 — End: ?

## 2012-07-18 MED ORDER — CLOPIDOGREL BISULFATE 75 MG PO TABS
75.0000 mg | ORAL_TABLET | Freq: Every day | ORAL | Status: DC
  Filled 2012-07-18 (×2): qty 1

## 2012-07-18 MED ORDER — NEPRO PO LIQD: 237.0000 mL | Freq: Three times a day (TID) | ORAL | Status: DC

## 2012-07-18 MED ORDER — SODIUM CHLORIDE 0.9 % IV SOLN
INTRAVENOUS | Status: DC | PRN
  Administered 2012-07-18 (×2): via INTRAVENOUS

## 2012-07-18 MED ORDER — ALBUTEROL SULFATE HFA 108 (90 BASE) MCG/ACT IN AERS
2.0000 | INHALATION_SPRAY | Freq: Four times a day (QID) | RESPIRATORY_TRACT | Status: DC | PRN
  Filled 2012-07-18: qty 6.7

## 2012-07-18 MED ORDER — ONDANSETRON HCL 4 MG/2ML IJ SOLN: 4.0000 mg | Freq: Four times a day (QID) | INTRAMUSCULAR | Status: DC | PRN

## 2012-07-18 MED ORDER — CINACALCET HCL 30 MG PO TABS
30.0000 mg | ORAL_TABLET | Freq: Every day | ORAL | Status: DC
  Filled 2012-07-18 (×2): qty 1

## 2012-07-18 MED ORDER — DEXTROSE 5 % IV SOLN
1.5000 g | INTRAVENOUS | Status: DC
  Filled 2012-07-18: qty 1.5

## 2012-07-18 MED ORDER — INSULIN GLARGINE 100 UNIT/ML ~~LOC~~ SOLN: 33.0000 [IU] | Freq: Every day | SUBCUTANEOUS | Status: DC

## 2012-07-18 MED ORDER — BIOTENE DRY MOUTH MT LIQD
15.0000 mL | Freq: Two times a day (BID) | OROMUCOSAL | Status: DC
  Administered 2012-07-18: 15 mL via OROMUCOSAL

## 2012-07-18 MED ORDER — 0.9 % SODIUM CHLORIDE (POUR BTL) OPTIME
TOPICAL | Status: DC | PRN
  Administered 2012-07-18: 1000 mL

## 2012-07-18 MED ORDER — SEVELAMER CARBONATE 800 MG PO TABS
1600.0000 mg | ORAL_TABLET | ORAL | Status: DC
  Administered 2012-07-18: 1600 mg via ORAL
  Filled 2012-07-18 (×4): qty 2

## 2012-07-18 MED ORDER — VITAMIN B-6 100 MG PO TABS
100.0000 mg | ORAL_TABLET | Freq: Every day | ORAL | Status: DC
  Filled 2012-07-18: qty 1

## 2012-07-18 MED ORDER — LIDOCAINE-EPINEPHRINE (PF) 1 %-1:200000 IJ SOLN
INTRAMUSCULAR | Status: AC
  Filled 2012-07-18: qty 10

## 2012-07-18 MED ORDER — LIDOCAINE HCL (CARDIAC) 20 MG/ML IV SOLN
INTRAVENOUS | Status: DC | PRN
  Administered 2012-07-18: 80 mg via INTRAVENOUS

## 2012-07-18 MED ORDER — FENTANYL CITRATE 0.05 MG/ML IJ SOLN: INTRAMUSCULAR | Status: DC | PRN

## 2012-07-18 MED ORDER — SEVELAMER CARBONATE 800 MG PO TABS
1600.0000 mg | ORAL_TABLET | Freq: Every day | ORAL | Status: DC
  Filled 2012-07-18: qty 5

## 2012-07-18 MED ORDER — OFLOXACIN 0.3 % OP SOLN
1.0000 [drp] | Freq: Three times a day (TID) | OPHTHALMIC | Status: DC
  Administered 2012-07-18: 1 [drp] via OPHTHALMIC
  Filled 2012-07-18: qty 5

## 2012-07-18 MED ORDER — HYDROMORPHONE HCL PF 1 MG/ML IJ SOLN: 0.2500 mg | INTRAMUSCULAR | Status: DC | PRN

## 2012-07-18 MED ORDER — METOCLOPRAMIDE HCL 10 MG PO TABS
10.0000 mg | ORAL_TABLET | Freq: Four times a day (QID) | ORAL | Status: DC
  Administered 2012-07-18: 10 mg via ORAL
  Filled 2012-07-18 (×6): qty 1

## 2012-07-18 MED ORDER — MUPIROCIN 2 % EX OINT
1.0000 "application " | TOPICAL_OINTMENT | Freq: Every day | CUTANEOUS | Status: DC
  Filled 2012-07-18: qty 22

## 2012-07-18 MED ORDER — HYDRALAZINE HCL 20 MG/ML IJ SOLN
10.0000 mg | INTRAMUSCULAR | Status: DC | PRN
  Filled 2012-07-18: qty 0.5

## 2012-07-18 MED ORDER — HEPARIN SODIUM (PORCINE) 1000 UNIT/ML IJ SOLN
INTRAMUSCULAR | Status: DC | PRN
  Administered 2012-07-18: 6000 [IU] via INTRAVENOUS

## 2012-07-18 MED ORDER — ONDANSETRON HCL 4 MG/2ML IJ SOLN: 4.0000 mg | Freq: Once | INTRAMUSCULAR | Status: DC | PRN

## 2012-07-18 MED ORDER — NEPRO/CARBSTEADY PO LIQD: 237.0000 mL | Freq: Three times a day (TID) | ORAL | Status: DC

## 2012-07-18 MED ORDER — PAROXETINE HCL 30 MG PO TABS
80.0000 mg | ORAL_TABLET | ORAL | Status: DC
  Filled 2012-07-18 (×2): qty 1

## 2012-07-18 MED ORDER — LABETALOL HCL 5 MG/ML IV SOLN
10.0000 mg | INTRAVENOUS | Status: DC | PRN
  Filled 2012-07-18: qty 4

## 2012-07-18 MED ORDER — ARIPIPRAZOLE 10 MG PO TABS
10.0000 mg | ORAL_TABLET | Freq: Every day | ORAL | Status: DC
  Filled 2012-07-18 (×2): qty 1

## 2012-07-18 MED ORDER — SULFAMETHOXAZOLE-TMP DS 800-160 MG PO TABS
1.0000 | ORAL_TABLET | Freq: Two times a day (BID) | ORAL | Status: DC
  Administered 2012-07-18: 1 via ORAL
  Filled 2012-07-18 (×3): qty 1

## 2012-07-18 SURGICAL SUPPLY — 48 items
BANDAGE ELASTIC 4 VELCRO ST LF (GAUZE/BANDAGES/DRESSINGS) ×2 IMPLANT
BANDAGE ESMARK 6X9 LF (GAUZE/BANDAGES/DRESSINGS) ×1 IMPLANT
BANDAGE GAUZE ELAST BULKY 4 IN (GAUZE/BANDAGES/DRESSINGS) ×2 IMPLANT
BNDG ESMARK 6X9 LF (GAUZE/BANDAGES/DRESSINGS) ×2
CANISTER SUCTION 2500CC (MISCELLANEOUS) ×2 IMPLANT
CLIP TI MEDIUM 6 (CLIP) ×2 IMPLANT
CLIP TI WIDE RED SMALL 6 (CLIP) ×2 IMPLANT
CLOTH BEACON ORANGE TIMEOUT ST (SAFETY) ×2 IMPLANT
COVER SURGICAL LIGHT HANDLE (MISCELLANEOUS) ×2 IMPLANT
CUFF TOURNIQUET SINGLE 18IN (TOURNIQUET CUFF) ×2 IMPLANT
DECANTER SPIKE VIAL GLASS SM (MISCELLANEOUS) IMPLANT
DERMABOND ADVANCED (GAUZE/BANDAGES/DRESSINGS)
DERMABOND ADVANCED .7 DNX12 (GAUZE/BANDAGES/DRESSINGS) IMPLANT
DRSG PAD ABDOMINAL 8X10 ST (GAUZE/BANDAGES/DRESSINGS) ×2 IMPLANT
ELECT REM PT RETURN 9FT ADLT (ELECTROSURGICAL) ×2
ELECTRODE REM PT RTRN 9FT ADLT (ELECTROSURGICAL) ×1 IMPLANT
GLOVE BIO SURGEON STRL SZ7.5 (GLOVE) ×2 IMPLANT
GLOVE BIOGEL PI IND STRL 6.5 (GLOVE) ×4 IMPLANT
GLOVE BIOGEL PI IND STRL 7.0 (GLOVE) ×1 IMPLANT
GLOVE BIOGEL PI IND STRL 7.5 (GLOVE) ×1 IMPLANT
GLOVE BIOGEL PI IND STRL 8 (GLOVE) ×1 IMPLANT
GLOVE BIOGEL PI INDICATOR 6.5 (GLOVE) ×4
GLOVE BIOGEL PI INDICATOR 7.0 (GLOVE) ×1
GLOVE BIOGEL PI INDICATOR 7.5 (GLOVE) ×1
GLOVE BIOGEL PI INDICATOR 8 (GLOVE) ×1
GLOVE ECLIPSE 6.5 STRL STRAW (GLOVE) ×4 IMPLANT
GLOVE SURG SS PI 7.5 STRL IVOR (GLOVE) ×2 IMPLANT
GOWN STRL NON-REIN LRG LVL3 (GOWN DISPOSABLE) ×8 IMPLANT
KIT BASIN OR (CUSTOM PROCEDURE TRAY) ×2 IMPLANT
KIT ROOM TURNOVER OR (KITS) ×2 IMPLANT
NS IRRIG 1000ML POUR BTL (IV SOLUTION) ×2 IMPLANT
PACK CV ACCESS (CUSTOM PROCEDURE TRAY) ×2 IMPLANT
PAD ARMBOARD 7.5X6 YLW CONV (MISCELLANEOUS) ×4 IMPLANT
PATCH VASCULAR VASCU GUARD 1X6 (Vascular Products) ×2 IMPLANT
SPONGE GAUZE 4X4 12PLY (GAUZE/BANDAGES/DRESSINGS) ×2 IMPLANT
SPONGE SURGIFOAM ABS GEL 100 (HEMOSTASIS) IMPLANT
SUT ETHILON 3 0 PS 1 (SUTURE) ×4 IMPLANT
SUT PROLENE 6 0 BV (SUTURE) ×4 IMPLANT
SUT VIC AB 3-0 SH 27 (SUTURE) ×1
SUT VIC AB 3-0 SH 27X BRD (SUTURE) ×1 IMPLANT
SUT VICRYL 4-0 PS2 18IN ABS (SUTURE) ×2 IMPLANT
SWAB COLLECTION DEVICE MRSA (MISCELLANEOUS) ×2 IMPLANT
TOWEL OR 17X24 6PK STRL BLUE (TOWEL DISPOSABLE) ×2 IMPLANT
TOWEL OR 17X26 10 PK STRL BLUE (TOWEL DISPOSABLE) ×2 IMPLANT
TUBE ANAEROBIC SPECIMEN COL (MISCELLANEOUS) ×2 IMPLANT
TUBE CONNECTING 12X1/4 (SUCTIONS) ×2 IMPLANT
UNDERPAD 30X30 INCONTINENT (UNDERPADS AND DIAPERS) ×2 IMPLANT
WATER STERILE IRR 1000ML POUR (IV SOLUTION) ×2 IMPLANT

## 2012-07-18 NOTE — Op Note (Signed)
NAME: [name]   MRN: [id number] DOB: 1970-10-02    DATE OF OPERATION: 07/18/2012  PREOP DIAGNOSIS: infected left arm AV graft  POSTOP DIAGNOSIS: same  PROCEDURE: removal of infected left arm AV graft with bovine pericardial patch angioplasty of the left brachial artery  SURGEON: Di Kindle. Edilia Bo, MD, FACS  ASSIST: Della Goo PA  ANESTHESIA: Gen.   EBL: minimal  INDICATIONS: [name] is a 41 y.o. female was seen yesterday in the office with an infected left forearm AV graft. This is a nonfunctional graft. I was asked to remove the graft. She has a functioning catheter for  FINDINGS: The graft was clearly infected and intraoperative cultures were obtained. 2 15 Blake drains were placed.  TECHNIQUE: Patient was taken to the operating room and received a general anesthetic. The left upper extremity was prepped and draped in usual sterile fashion. A transverse incision was made at the antecubital level and the arterial and venous limbs of the graft were dissected free. They were divided. The graft was clearly infected at both levels. Was able to the graft through the tunnel without any difficulty. I then dissected  graft down to the vein. This was an end-to-side anastomosis. This was chronically occluded. I dissected out a segment of vein which was chronically sclerotic and therefore could not be used as a vein patch. The extremity was placed on the upper arm. The patient was heparinized. The arm was exsanguinated with an Esmarch bandage and the tourniquet inflated to 250 mm mercury. Under tourniquet control, the entire arterial segment of the graft was removed and the brachial artery. A bovine pericardial patch was tailored and sewn using continuous 6-0 Prolene suture. At the completion there was an excellent Doppler signal in the wrist. 2:15 Penrose drains were placed in the tract of the old graft. This was brought out through a distal counterincision. Incision at the  antecubital level was closed with a deep layer of 3-0 Vicryl and the skin was closed with 4-0 Vicryl. A dressing was applied. The patient tolerated the procedure well and was transferred to the recovery room in stable condition. All needle and sponge counts were correct.  Waverly Ferrari, MD, FACS [facility]  DATE OF DICTATION:   07/18/2012

## 2012-07-18 NOTE — H&P (View-Only) (Signed)
Subjective:     Patient ID: [name], female   DOB: [birth year], 41 y.o.   MRN: [id number]  HPI: 41 year old female patient seen by various physicians in our practice due to the complicated vascular history. The patient called asking to be seen deep to a left forearm graft is infected. The patient was brought in for evaluation, she states this has been going on for a month. The patient was most recently in the hospital for cellulitis as well as other medical difficulties.   Review of Systems: 12 point review of systems is notable for the difficulties described above otherwise unremarkable     Objective:   Physical Exam: Afebrile, vital signs are stable, left forearm graft site has not been used in 2 years, it is red and is draining pus as well as blood. Dr. Darrick Penna examined the patient and explained to her she would need to have this removed since possible.     Assessment:     Infected left forearm graft  unused in 2 years.    Plan:     Per Dr. Darrick Penna the patient will be added to Dr. Quillian Quince surgery scheduled for tomorrow, 07/18/2012 for excision of this graft. The patient states understanding her followup will will be pending that procedure.  Lauree Chandler ANP   Clinic M.D.: Fields

## 2012-07-18 NOTE — Transfer of Care (Signed)
Immediate Anesthesia Transfer of Care Note  Patient: [name]  Procedure(s) Performed: Procedure(s) (LRB) with comments: REMOVAL OF ARTERIOVENOUS GORETEX GRAFT (AVGG) (Left) PATCH ANGIOPLASTY (Left) - with 1cm x 6cm vascu guard brachial patch angioplasty  Patient Location: PACU  Anesthesia Type:General  Level of Consciousness: awake and sedated  Airway & Oxygen Therapy: Patient Spontanous Breathing and Patient connected to nasal cannula oxygen  Post-op Assessment: Report given to PACU RN and Post -op Vital signs reviewed and stable  Post vital signs: Reviewed and stable  Complications: No apparent anesthesia complications

## 2012-07-18 NOTE — Progress Notes (Signed)
Pt arrived from PACU. Lethargic. VSS. Pt has sleep apnea. Left arm wrapped in ace wrap. L arm radial pulse palpable. Pt too lethargic to complete admission history at this time. Family has already left. Will plan to finish admission history in the morning when family returns. Bed alarm on. Call bell within reach. Pt too lethargic to orient to room at this time. Will continue to monitor.

## 2012-07-18 NOTE — OR Nursing (Signed)
Documentation completed by Lanell Matar, RN from [phone] to [phone].

## 2012-07-18 NOTE — Interval H&P Note (Signed)
History and Physical Interval Note:  07/18/2012 2:07 PM  [name]  has presented today for surgery, with the diagnosis of INFECTED AVG  The various methods of treatment have been discussed with the patient and family. After consideration of risks, benefits and other options for treatment, the patient has consented to  Procedure(s) (LRB) with comments: REMOVAL OF ARTERIOVENOUS GORETEX GRAFT (AVGG) (Left) as a surgical intervention .  The patient's history has been reviewed, patient examined, no change in status, stable for surgery.  I have reviewed the patient's chart and labs.  Questions were answered to the patient's satisfaction.     DICKSON,CHRISTOPHER S

## 2012-07-18 NOTE — Anesthesia Preprocedure Evaluation (Signed)
Anesthesia Evaluation  Patient identified by MRN, date of birth, ID band Patient awake    Reviewed: Allergy & Precautions, H&P , NPO status , Patient's Chart, lab work & pertinent test results  History of Anesthesia Complications (+) PONV  Airway Mallampati: I TM Distance: >3 FB Neck ROM: full    Dental   Pulmonary shortness of breath, sleep apnea ,          Cardiovascular hypertension, + CAD, + Past MI and + Peripheral Vascular Disease + dysrhythmias Rhythm:irregular Rate:Normal     Neuro/Psych  Headaches, PSYCHIATRIC DISORDERS Anxiety Depression  Neuromuscular disease    GI/Hepatic GERD-  ,  Endo/Other  diabetes, Type 2, Insulin Dependent and Oral Hypoglycemic Agents  Renal/GU CRF, ESRF and DialysisRenal disease     Musculoskeletal   Abdominal   Peds  Hematology   Anesthesia Other Findings   Reproductive/Obstetrics                           Anesthesia Physical Anesthesia Plan  ASA: III  Anesthesia Plan: MAC and General   Post-op Pain Management:    Induction: Intravenous  Airway Management Planned: Mask and LMA  Additional Equipment:   Intra-op Plan:   Post-operative Plan: Extubation in OR  Informed Consent: I have reviewed the patients History and Physical, chart, labs and discussed the procedure including the risks, benefits and alternatives for the proposed anesthesia with the patient or authorized representative who has indicated his/her understanding and acceptance.     Plan Discussed with: CRNA, Anesthesiologist and Surgeon  Anesthesia Plan Comments:         Anesthesia Quick Evaluation

## 2012-07-18 NOTE — Anesthesia Postprocedure Evaluation (Signed)
  Anesthesia Post-op Note  Patient: [name]  Procedure(s) Performed: Procedure(s) (LRB) with comments: REMOVAL OF ARTERIOVENOUS GORETEX GRAFT (AVGG) (Left) PATCH ANGIOPLASTY (Left) - with 1cm x 6cm vascu guard brachial patch angioplasty  Patient Location: PACU  Anesthesia Type:General  Level of Consciousness: awake, sedated and patient cooperative  Airway and Oxygen Therapy: Patient Spontanous Breathing  Post-op Pain: mild  Post-op Assessment: Post-op Vital signs reviewed, Patient's Cardiovascular Status Stable, Respiratory Function Stable, Patent Airway, No signs of Nausea or vomiting and Pain level controlled  Post-op Vital Signs: stable  Complications: No apparent anesthesia complications

## 2012-07-19 LAB — MRSA PCR SCREENING: MRSA by PCR: NEGATIVE

## 2012-07-19 NOTE — Discharge Summary (Signed)
[facility] Discharge Summary   Patient ID:  [name] MRN: [id number] DOB/AGE: [birth year] 41 y.o.  Admit date: 07/18/2012 Discharge date: 07/19/2012 Date of Surgery: 07/18/2012 Surgeon: Surgeon(s): Chuck Hint, MD  Admission Diagnosis: INFECTED AVG  Discharge Diagnoses:  INFECTED AVG  Secondary Diagnoses: Past Medical History  Diagnosis Date  . DVT (deep venous thrombosis)     DVT HISTORY  . Diabetes mellitus   . Migraine     HISTORY  . CAD (coronary artery disease)     Last cath 2009:  LAD stent 80% stenosis. Circumflex 60-70% stenosis AV groove, right coronary artery 50-60% stenosis. She did have cutting balloon angioplasty of the LAD lesion into a diagonal. However, there was restenosis of this and it was managed medically.  . Hyperlipidemia   . Hyperparathyroidism   . PE (pulmonary embolism)     HISTORY  . Dialysis patient   . Anemia   . Renal failure     dialysis [place] t,th sat  . Nephrotic syndrome   . Peripheral vascular disease   . MRSA infection   . Anxiety   . Gastroparesis   . Complication of anesthesia   . PONV (postoperative nausea and vomiting)     x3 days post anesth. on 12/19/2011  . Myocardial infarction     x3 , last one - 10 yrs. ago  . Hypertension     sees Dr. Antoine Poche - [place], last stress test 11/2011, see result in EPIC, saw Dr. Antoine Poche as well at that time    . Shortness of breath   . Recurrent upper respiratory infection (URI)     Bronchitis 11/2011 - saw Dr. Loney Hering, treating currently /w antibiotic   . GERD (gastroesophageal reflux disease)   . Pneumonia     APH- 2012  . Noncompliance of patient with renal dialysis   . Cellulitis Oct. 2013  . MRSA (methicillin resistant staph aureus) culture positive Oct 2013  . Anxiety and depression   . Bipolar 1 disorder   . Depression   . Sleep apnea   . Oxygen dependent     2 L continuous    Procedure(s): REMOVAL OF ARTERIOVENOUS GORETEX GRAFT  (AVGG) PATCH ANGIOPLASTY  Discharged Condition: good  HPI:  41 year old female patient seen by various physicians in our practice due to the complicated vascular history. The patient called asking to be seen deep to a left forearm graft is infected. The patient was brought in for evaluation, she states this has been going on for a month. The patient was most recently in the hospital for cellulitis as well as other medical difficulties. She was admitted for removal of infected gortex graft   Hospital Course:  [name] is a 41 y.o. female is S/P Left Procedure(s): REMOVAL OF ARTERIOVENOUS GORETEX GRAFT (AVGG) PATCH ANGIOPLASTY Extubated: POD # 0 Post-op wounds healing well Pt. Ambulating, voiding and taking PO diet without difficulty. Pt pain controlled with PO pain meds. Labs as below Complications:none  Consults:     Significant Diagnostic Studies: CBC Lab Results  Component Value Date   WBC 9.8 06/27/2012   HGB 9.9* 07/18/2012   HCT 29.0* 07/18/2012   MCV 99.2 06/27/2012   PLT 253 06/27/2012    BMET    Component Value Date/Time   NA 137 07/18/2012 1112   K 4.1 07/18/2012 1112   CL 103 07/18/2012 1112   CO2 25 06/28/2012 0530   GLUCOSE 93 07/18/2012 1112   BUN 35* 07/18/2012 1112  CREATININE 3.70* 07/18/2012 1112   CREATININE 1.78* 10/17/2008 2000   CALCIUM 9.0 06/28/2012 0530   CALCIUM 8.3* 08/21/2011 0533   GFRNONAA 30* 06/28/2012 0530   GFRAA 35* 06/28/2012 0530   COAG Lab Results  Component Value Date   INR 1.47 06/21/2012   INR 2.06* 06/19/2012   INR 1.11 04/22/2012     Disposition:  Discharge to :Home Discharge Orders    Future Appointments: Provider: Department: Dept Phone: Center:   07/21/2012 11:00 AM Evern Bio, NP [facility] [phone] [facility]   08/04/2012 10:30 AM [facility] [phone] [facility]   08/04/2012 11:00 AM [facility] [phone] [facility]   08/04/2012 11:30 AM Nada Libman, MD [facility] [phone] [facility]   08/25/2012 2:30 PM Malissa Hippo, MD [facility] [phone] None     Future Orders Please Complete By Expires   Resume previous diet      Driving Restrictions      Comments:   No driving   Lifting restrictions      Comments:   No lifting   Call MD for:  temperature >100.5      Call MD for:  redness, tenderness, or signs of infection (pain, swelling, bleeding, redness, odor or green/yellow discharge around incision site)      Call MD for:  severe or increased pain, loss or decreased feeling  in affected limb(s)      Increase activity slowly      Comments:   Walk with assistance use walker or cane as needed   Leave dressing on - Keep it clean, dry, and intact until clinic visit         Jaylen, Claude  Home Medication Instructions [id number]   Printed on:07/19/12 1129  Medication Information                    insulin lispro (HUMALOG) 100 UNIT/ML injection Inject 7-15 Units into the skin 4 (four) times daily -  before meals and at bedtime. TAKING ON SLIDING SCALE 200-249=7 units 250-299=10 units 300-349=12 units >350=15 units           clopidogrel (PLAVIX) 75 MG tablet Take 75 mg by mouth daily. Last dose 12/17/2011           PARoxetine (PAXIL) 40 MG tablet Take 80 mg by mouth every morning.            sevelamer (RENVELA) 800 MG tablet Take 1,600-4,000 mg by mouth 5 (five) times daily. *Take 5 tablets daily with each meal and take 2 tablets with snacks**           albuterol (PROVENTIL HFA;VENTOLIN HFA) 108 (90 BASE) MCG/ACT inhaler Inhale 2 puffs into the lungs every 6 (six) hours as needed. Shortness of breath           ARIPiprazole (ABILIFY) 2 MG tablet Take 10 mg by mouth daily.            cinacalcet (SENSIPAR) 30 MG tablet Take 30 mg by mouth daily with supper.            omeprazole (PRILOSEC) 40 MG  capsule Take 40 mg by mouth daily.            pyridoxine (B-6) 100 MG tablet Take 100 mg by mouth daily.           b complex-vitamin c-folic acid (NEPHRO-VITE)  0.8 MG TABS Take 0.8 mg by mouth at bedtime.           darbepoetin (ARANESP) 60 MCG/0.3ML SOLN Inject 60 mcg into the vein every Saturday with hemodialysis.           insulin glargine (LANTUS) 100 UNIT/ML injection Inject 33 Units into the skin at bedtime.           metoCLOPramide (REGLAN) 10 MG tablet Take 10 mg by mouth 4 (four) times daily.           furosemide (LASIX) 40 MG tablet Take 40 mg by mouth daily.            methocarbamol (ROBAXIN) 500 MG tablet Take 500 mg by mouth 2 (two) times daily.            ofloxacin (OCUFLOX) 0.3 % ophthalmic solution Place 1 drop into both eyes 3 (three) times daily.            bacitracin ophthalmic ointment Place 1 application into both eyes at bedtime. apply to eye           promethazine (PHENERGAN) 25 MG tablet Take 25 mg by mouth every 6 (six) hours as needed. For nausea           mupirocin ointment (BACTROBAN) 2 % Apply 1 application topically daily.           fluticasone (FLONASE) 50 MCG/ACT nasal spray Place 1 spray into the nose daily.           ALPRAZolam (XANAX) 1 MG tablet Take 1 tablet (1 mg total) by mouth 3 (three) times daily as needed. For anxiety           Nutritional Supplements (NEPRO) LIQD Take 237 mLs by mouth 3 (three) times daily with meals as needed.           butalbital-acetaminophen-caffeine (FIORICET, ESGIC) 50-325-40 MG per tablet Take 1 tablet by mouth 3 (three) times daily as needed for headache. For headaches           sulfamethoxazole-trimethoprim (BACTRIM DS) 800-160 MG per tablet Take 1 tablet by mouth 2 (two) times daily.            ULTICARE INSULIN SYRINGE 31G X 5/16" 1 ML MISC            amoxicillin-clavulanate (AUGMENTIN) 400-57 MG/5ML suspension            oxyCODONE-acetaminophen (PERCOCET) 10-325 MG per tablet Take 1 tablet  by mouth every 4 (four) hours as needed. For pain            Verbal and written Discharge instructions given to the patient. Wound care per Discharge AVS Follow-up Information    Follow up with [facility]. On 07/18/2012. (11 am - Nurse visit for dressing change and remove drains)    Contact information:   [address] [place] [address]          Signed: Marlowe Shores 07/19/2012, 11:29 AM

## 2012-07-20 NOTE — Discharge Summary (Signed)
Agree with plans for D/C.  Cari Caraway Beeper [phone] 07/20/2012

## 2012-07-21 ENCOUNTER — Encounter: Payer: Self-pay | Admitting: Neurosurgery

## 2012-07-21 ENCOUNTER — Ambulatory Visit (INDEPENDENT_AMBULATORY_CARE_PROVIDER_SITE_OTHER): Payer: Medicare Other | Admitting: Neurosurgery

## 2012-07-21 ENCOUNTER — Ambulatory Visit: Payer: Medicare Other

## 2012-07-21 ENCOUNTER — Ambulatory Visit: Payer: Medicare Other | Admitting: Neurosurgery

## 2012-07-21 ENCOUNTER — Encounter: Payer: Self-pay | Admitting: Surgery

## 2012-07-21 VITALS — BP 137/66 | HR 104 | Resp 18 | Ht 64.0 in | Wt 191.0 lb

## 2012-07-21 DIAGNOSIS — N186 End stage renal disease: Secondary | ICD-10-CM

## 2012-07-21 LAB — WOUND CULTURE

## 2012-07-21 NOTE — Progress Notes (Signed)
Subjective:     Patient ID: [name], female   DOB: 06/27/1971, 41 y.o.   MRN: [id number]  HPI: 41 year old female patient seen for multiple access problems as well as vascular difficulties. The patient was seen on November 21 by myself and was evaluated by Dr. Darrick Penna and was placed on the OR schedule for excision of a left forearm graft that was infected. The patient had the procedure done returns today for Penrose drain removal and wound check.   Review of Systems: 12 point review of systems is notable for the difficulties described above otherwise unremarkable     Objective:   Physical Exam: Afebrile, vital signs are stable, incision has no redness or drainage. Penrose drains were removed without difficulty there was no drainage after and the patient has minimal pain complaints although she does state she does have surgical pain I explained to her that this was normal this point.     Assessment:     This is a patient that is not quite one-week status post excision infected left forearm graft, Penrose drains removed the patient was given direction to keep the wound clean until her next followup.    Plan:     The patient has a scheduled appointment with Dr. Myra Gianotti 08/04/2012, her followup here will be pending that appointment. Steri-Strips were placed over the surgical wound after the Penrose drains were removed, the patient's questions were encouraged and answered, she is in agreement with this plan.  Lauree Chandler ANP  Clinic M.D.: Myra Gianotti

## 2012-07-22 ENCOUNTER — Encounter (HOSPITAL_COMMUNITY): Payer: Self-pay

## 2012-07-24 LAB — ANAEROBIC CULTURE

## 2012-08-01 ENCOUNTER — Encounter: Payer: Self-pay | Admitting: Surgery

## 2012-08-04 ENCOUNTER — Encounter (INDEPENDENT_AMBULATORY_CARE_PROVIDER_SITE_OTHER): Payer: Medicare Other | Admitting: *Deleted

## 2012-08-04 ENCOUNTER — Encounter: Payer: Medicare Other | Admitting: *Deleted

## 2012-08-04 ENCOUNTER — Encounter: Payer: Self-pay | Admitting: Surgery

## 2012-08-04 ENCOUNTER — Ambulatory Visit (INDEPENDENT_AMBULATORY_CARE_PROVIDER_SITE_OTHER): Payer: Medicare Other | Admitting: Surgery

## 2012-08-04 VITALS — BP 121/65 | HR 98 | Temp 98.0°F | Ht 64.0 in | Wt 198.0 lb

## 2012-08-04 DIAGNOSIS — Z48812 Encounter for surgical aftercare following surgery on the circulatory system: Secondary | ICD-10-CM

## 2012-08-04 DIAGNOSIS — I70219 Atherosclerosis of native arteries of extremities with intermittent claudication, unspecified extremity: Secondary | ICD-10-CM

## 2012-08-04 DIAGNOSIS — N186 End stage renal disease: Secondary | ICD-10-CM

## 2012-08-04 DIAGNOSIS — I739 Peripheral vascular disease, unspecified: Secondary | ICD-10-CM

## 2012-08-04 NOTE — Progress Notes (Signed)
[facility] [place]   Patient name: [name] MRN: [id number] DOB: Oct 22, 1970 Sex: female     Chief Complaint  Patient presents with  . PVD    3 month f/u, s/p left arm AVG removal 07/18/2012    HISTORY OF PRESENT ILLNESS: The patient is back today for followup. She is status post aortobifemoral bypass graft with right femoral-popliteal bypass on 12/28/2011. This was done in the setting of 2 leg ulcers on the right. She had a protracted postoperative course which was complicated by several readmissions for leg swelling as well as groin and incisional complications. Ultimately, she has healed all of her wounds as well as her ulcers. She was recently in the hospital for removal of an infected dialysis graft. She reports no fevers or chills today  Past Medical History  Diagnosis Date  . DVT (deep venous thrombosis)     DVT HISTORY  . Diabetes mellitus   . Migraine     HISTORY  . CAD (coronary artery disease)     Last cath 2009:  LAD stent 80% stenosis. Circumflex 60-70% stenosis AV groove, right coronary artery 50-60% stenosis. She did have cutting balloon angioplasty of the LAD lesion into a diagonal. However, there was restenosis of this and it was managed medically.  . Hyperlipidemia   . Hyperparathyroidism   . PE (pulmonary embolism)     HISTORY  . Dialysis patient   . Anemia   . Renal failure     dialysis [place] t,th sat  . Nephrotic syndrome   . Peripheral vascular disease   . MRSA infection   . Anxiety   . Gastroparesis   . Complication of anesthesia   . PONV (postoperative nausea and vomiting)     x3 days post anesth. on 12/19/2011  . Myocardial infarction     x3 , last one - 10 yrs. ago  . Hypertension     sees Dr. Antoine Poche - [place], last stress test 11/2011, see result in EPIC, saw Dr. Antoine Poche as well at that time    . Shortness of breath   . Recurrent upper respiratory infection (URI)     Bronchitis 11/2011 - saw Dr. Loney Hering, treating  currently /w antibiotic   . GERD (gastroesophageal reflux disease)   . Pneumonia     APH- 2012  . Noncompliance of patient with renal dialysis   . Cellulitis Oct. 2013  . MRSA (methicillin resistant staph aureus) culture positive Oct 2013  . Anxiety and depression   . Bipolar 1 disorder   . Depression   . Sleep apnea   . Oxygen dependent     2 L continuous    Past Surgical History  Procedure Date  . Incise and drain abcess     OF THIGHS FROM INSULIN INJECTIONS  . Heart stents   . Portacath placement 2003  . Cataract extraction w/phaco 11/09/2011    Procedure: CATARACT EXTRACTION PHACO AND INTRAOCULAR LENS PLACEMENT (IOC);  Surgeon: Edmon Crape, MD;  Location: [facility] OR;  Service: Ophthalmology;  Laterality: Right;  . Pars plana vitrectomy 11/09/2011    Procedure: PARS PLANA VITRECTOMY WITH 23 GAUGE;  Surgeon: Edmon Crape, MD;  Location: [facility] OR;  Service: Ophthalmology;  Laterality: Right;  Ahmed Valve; Scleral Reinforcement Graft  . Dg av dialysis  shunt access exist*l* or 12/13/11    left arm  . Dialysis cath inserted & portacath      dialysis catheter inserted & portacath d/c'd- 12/19/2011  .  Back surgery X 4    x4 times   . Cholecystectomy   . Eye surgery   . Cardiac catheterization   . Aorta - bilateral femoral artery bypass graft 12/28/2011    Procedure: AORTA BIFEMORAL BYPASS GRAFT;  Surgeon: Nada Libman, MD;  Location: [facility] OR;  Service: Vascular;  Laterality: N/A;  AortoBifemoral Bypass using hemasheild 14x7 graft  . Femoral-popliteal bypass graft 12/28/2011    Procedure: BYPASS GRAFT FEMORAL-POPLITEAL ARTERY;  Surgeon: Nada Libman, MD;  Location: [facility] OR;  Service: Vascular;  Laterality: Right;  right femoral popliteal bypass using 6x80 propaten graft  . Insertion of dialysis catheter 02/15/2012    Procedure: INSERTION OF DIALYSIS CATHETER;  Surgeon: Chuck Hint, MD;  Location: [facility] OR;  Service: Vascular;  Laterality: Right;  insertion of dialysis catheter right  internal jugular vein,removal of left internal jugular vein dialysis catheter  . Insertion of dialysis catheter 02/21/2012    Procedure: INSERTION OF DIALYSIS CATHETER;  Surgeon: Chuck Hint, MD;  Location: [facility] OR;  Service: Cardiovascular;  Laterality: N/A;  Insertion new dialysis catheter; possible venoplasty of fibrin sheath of  Right IJ catheter  . Venogram 02/21/2012    Procedure: VENOGRAM;  Surgeon: Chuck Hint, MD;  Location: [facility] OR;  Service: Cardiovascular;  Laterality: N/A;  possible venoplasty  . Insertion of dialysis catheter 04/23/2012    Procedure: INSERTION OF DIALYSIS CATHETER;  Surgeon: Sherren Kerns, MD;  Location: [facility] OR;  Service: Vascular;  Laterality: Right;  using a 2fr. x 23 cannon catheter in Right Internal Jugular  . Esophagogastroduodenoscopy 06/21/2012    Procedure: ESOPHAGOGASTRODUODENOSCOPY (EGD);  Surgeon: Charna Elizabeth, MD;  Location: [facility] ENDOSCOPY;  Service: Endoscopy;  Laterality: N/A;  bedside  . Avgg removal 07/18/2012    Left arm  . Avgg removal 07/18/2012    Procedure: REMOVAL OF ARTERIOVENOUS GORETEX GRAFT (AVGG);  Surgeon: Chuck Hint, MD;  Location: [facility] OR;  Service: Vascular;  Laterality: Left;  . Patch angioplasty 07/18/2012    Procedure: PATCH ANGIOPLASTY;  Surgeon: Chuck Hint, MD;  Location: [facility] OR;  Service: Vascular;  Laterality: Left;  with 1cm x 6cm vascu guard brachial patch angioplasty    History   Social History  . Marital Status: Divorced    Spouse Name: N/A    Number of Children: N/A  . Years of Education: N/A   Occupational History  . Not on file.   Social History Main Topics  . Smoking status: Current Every Day Smoker -- 3.0 packs/day for 20 years    Types: Cigarettes  . Smokeless tobacco: Never Used     Comment: pt states she is not ready to quit   . Alcohol Use: No  . Drug Use: No  . Sexually Active: No   Other Topics Concern  . Not on file   Social History Narrative   Current as of  06/05/2012: Pt is a patient of Dr. Loney Hering in [place], [place]. Lives with mother and sister. HD on [facility] in [place], [place]. Current smoker, 3 full packs per day.    Family History  Problem Relation Age of Onset  . Transient ischemic attack Mother   . Hepatitis Sister   . Diabetes type II Other   . Anesthesia problems Neg Hx   . Hypotension Neg Hx   . Malignant hyperthermia Neg Hx   . Pseudochol deficiency Neg Hx     Allergies as of 08/04/2012 - Review Complete 08/04/2012  Allergen Reaction Noted  . Compazine (  prochlorperazine) Swelling 06/25/2011  . Contrast media (iodinated diagnostic agents) Swelling 06/25/2011  . Meperidine hcl Swelling   . Metformin and related Other (See Comments) 08/20/2011  . Morphine Swelling   . Nubain (nalbuphine hcl) Swelling 04/15/2011  . Tobramycin Other (See Comments) 03/06/2012  . Topiramate (topamax) Swelling 06/25/2011  . Toradol (ketorolac tromethamine) Swelling 04/15/2011    Current Outpatient Prescriptions on File Prior to Visit  Medication Sig Dispense Refill  . albuterol (PROVENTIL HFA;VENTOLIN HFA) 108 (90 BASE) MCG/ACT inhaler Inhale 2 puffs into the lungs every 6 (six) hours as needed. Shortness of breath      . ALPRAZolam (XANAX) 1 MG tablet Take 1 tablet (1 mg total) by mouth 3 (three) times daily as needed. For anxiety  60 tablet  0  . ARIPiprazole (ABILIFY) 2 MG tablet Take 10 mg by mouth daily.       Marland Kitchen b complex-vitamin c-folic acid (NEPHRO-VITE) 0.8 MG TABS Take 0.8 mg by mouth at bedtime.      . bacitracin ophthalmic ointment Place 1 application into both eyes at bedtime. apply to eye      . butalbital-acetaminophen-caffeine (FIORICET, ESGIC) 50-325-40 MG per tablet Take 1 tablet by mouth 3 (three) times daily as needed for headache. For headaches  45 tablet  0  . cinacalcet (SENSIPAR) 30 MG tablet Take 30 mg by mouth daily with supper.       . clopidogrel (PLAVIX) 75 MG tablet Take 75 mg by mouth daily. Last dose 12/17/2011      . darbepoetin  (ARANESP) 60 MCG/0.3ML SOLN Inject 60 mcg into the vein every Saturday with hemodialysis.      . furosemide (LASIX) 40 MG tablet Take 40 mg by mouth daily.       . insulin glargine (LANTUS) 100 UNIT/ML injection Inject 33 Units into the skin at bedtime.      . insulin lispro (HUMALOG) 100 UNIT/ML injection Inject 7-15 Units into the skin 4 (four) times daily -  before meals and at bedtime. TAKING ON SLIDING SCALE 200-249=7 units 250-299=10 units 300-349=12 units >350=15 units      . methocarbamol (ROBAXIN) 500 MG tablet Take 500 mg by mouth 2 (two) times daily.       . metoCLOPramide (REGLAN) 10 MG tablet Take 10 mg by mouth 4 (four) times daily.      . Nutritional Supplements (NEPRO) LIQD Take 237 mLs by mouth 3 (three) times daily with meals as needed.    0  . ofloxacin (OCUFLOX) 0.3 % ophthalmic solution Place 1 drop into both eyes 3 (three) times daily.       Marland Kitchen omeprazole (PRILOSEC) 40 MG capsule Take 40 mg by mouth daily.       Marland Kitchen oxyCODONE-acetaminophen (PERCOCET) 10-325 MG per tablet Take 1 tablet by mouth every 4 (four) hours as needed. For pain  20 tablet  0  . PARoxetine (PAXIL) 40 MG tablet Take 80 mg by mouth every morning.       . promethazine (PHENERGAN) 25 MG tablet Take 25 mg by mouth every 6 (six) hours as needed. For nausea      . pyridoxine (B-6) 100 MG tablet Take 100 mg by mouth daily.      . sevelamer (RENVELA) 800 MG tablet Take 1,600-4,000 mg by mouth 5 (five) times daily. *Take 5 tablets daily with each meal and take 2 tablets with snacks**      . sulfamethoxazole-trimethoprim (BACTRIM DS) 800-160 MG per tablet Take 1 tablet by mouth  2 (two) times daily.       Marland Kitchen ULTICARE INSULIN SYRINGE 31G X 5/16" 1 ML MISC       . amoxicillin-clavulanate (AUGMENTIN) 400-57 MG/5ML suspension       . fluticasone (FLONASE) 50 MCG/ACT nasal spray Place 1 spray into the nose daily.      . mupirocin ointment (BACTROBAN) 2 % Apply 1 application topically daily.         REVIEW OF  SYSTEMS: Cardiovascular: Positive shortness of breath when lying flat and with exertion. Positive for pain in her legs and walking and lying flat. Positive for history of DVT. Positive for swelling in her legs.  Pulmonary: Positive for productive cough asthma wheezing.  Neuro: Positive for weakness in arms and legs, temporary loss of vision, dizziness Hematology: Positive for bleeding problems and with clotting problems Psych: Positive for history of major depression Skin: Positive for itching All other systems are negative as documented in the encounter form.  PHYSICAL EXAMINATION:   Vital signs are BP 121/65  Pulse 98  Temp 98 F (36.7 C) (Oral)  Ht 5\' 4"  (1.626 m)  Wt 198 lb (89.812 kg)  BMI 33.99 kg/m2  SpO2 98%  LMP 09/03/2008 General: The patient appears their stated age. HEENT:  No gross abnormalities Pulmonary:  Non labored breathing  Musculoskeletal: There are no major deformities. Neurologic: No focal weakness or paresthesias are detected, Skin: All wounds and ulcers have healed Psychiatric: The patient has normal affect. Cardiovascular: There is a regular rate and rhythm without significant murmur appreciated.   Diagnostic Studies Duplex ultrasound today reveals several fluid collections along her incisions. The femoral-popliteal bypass graft is widely patent without evidence of restenosis. ABI on the right is 1.1 with triphasic waveforms. On the left it is 0.69.  Assessment: Status post aortobifemoral bypass graft and right femoral popliteal bypass graft for nonhealing ulcers Plan: The patient has finally healed all of her wounds. Her ulcers have healed. She has multiple additional issues including leg swelling and dialysis issues. From my perspective and from her bypass graft perspective she is doing very well she'll be placed on our ultrasound surveillance protocol and follow with me in 6 months  V. Charlena Cross, M.D. [facility]  [place] Office: [phone] Pager:  [phone]

## 2012-08-05 NOTE — Addendum Note (Signed)
Addended by: Sharee Pimple on: 08/05/2012 08:53 AM   Modules accepted: Orders

## 2012-08-12 ENCOUNTER — Emergency Department (HOSPITAL_COMMUNITY): Payer: Medicare Other

## 2012-08-12 ENCOUNTER — Encounter (HOSPITAL_COMMUNITY): Payer: Self-pay

## 2012-08-12 ENCOUNTER — Emergency Department (HOSPITAL_COMMUNITY)
Admission: EM | Admit: 2012-08-12 | Discharge: 2012-08-13 | Disposition: A | Discharge status: Home / Self Care | Payer: Medicare Other | Attending: Emergency Medicine | Admitting: Emergency Medicine

## 2012-08-12 DIAGNOSIS — Z7901 Long term (current) use of anticoagulants: Secondary | ICD-10-CM | POA: Insufficient documentation

## 2012-08-12 DIAGNOSIS — K219 Gastro-esophageal reflux disease without esophagitis: Secondary | ICD-10-CM | POA: Insufficient documentation

## 2012-08-12 DIAGNOSIS — I120 Hypertensive chronic kidney disease with stage 5 chronic kidney disease or end stage renal disease: Secondary | ICD-10-CM | POA: Insufficient documentation

## 2012-08-12 DIAGNOSIS — Z8679 Personal history of other diseases of the circulatory system: Secondary | ICD-10-CM | POA: Insufficient documentation

## 2012-08-12 DIAGNOSIS — Z8701 Personal history of pneumonia (recurrent): Secondary | ICD-10-CM | POA: Insufficient documentation

## 2012-08-12 DIAGNOSIS — Z992 Dependence on renal dialysis: Secondary | ICD-10-CM | POA: Insufficient documentation

## 2012-08-12 DIAGNOSIS — K3184 Gastroparesis: Secondary | ICD-10-CM | POA: Insufficient documentation

## 2012-08-12 DIAGNOSIS — Z8659 Personal history of other mental and behavioral disorders: Secondary | ICD-10-CM | POA: Insufficient documentation

## 2012-08-12 DIAGNOSIS — F17200 Nicotine dependence, unspecified, uncomplicated: Secondary | ICD-10-CM | POA: Insufficient documentation

## 2012-08-12 DIAGNOSIS — Z8709 Personal history of other diseases of the respiratory system: Secondary | ICD-10-CM | POA: Insufficient documentation

## 2012-08-12 DIAGNOSIS — N186 End stage renal disease: Secondary | ICD-10-CM | POA: Insufficient documentation

## 2012-08-12 DIAGNOSIS — E1143 Type 2 diabetes mellitus with diabetic autonomic (poly)neuropathy: Secondary | ICD-10-CM

## 2012-08-12 DIAGNOSIS — I2510 Atherosclerotic heart disease of native coronary artery without angina pectoris: Secondary | ICD-10-CM | POA: Insufficient documentation

## 2012-08-12 DIAGNOSIS — Z794 Long term (current) use of insulin: Secondary | ICD-10-CM | POA: Insufficient documentation

## 2012-08-12 DIAGNOSIS — E785 Hyperlipidemia, unspecified: Secondary | ICD-10-CM | POA: Insufficient documentation

## 2012-08-12 DIAGNOSIS — I252 Old myocardial infarction: Secondary | ICD-10-CM | POA: Insufficient documentation

## 2012-08-12 DIAGNOSIS — E213 Hyperparathyroidism, unspecified: Secondary | ICD-10-CM | POA: Insufficient documentation

## 2012-08-12 DIAGNOSIS — Z86718 Personal history of other venous thrombosis and embolism: Secondary | ICD-10-CM | POA: Insufficient documentation

## 2012-08-12 DIAGNOSIS — R1110 Vomiting, unspecified: Secondary | ICD-10-CM

## 2012-08-12 DIAGNOSIS — E1149 Type 2 diabetes mellitus with other diabetic neurological complication: Secondary | ICD-10-CM | POA: Insufficient documentation

## 2012-08-12 DIAGNOSIS — Z86711 Personal history of pulmonary embolism: Secondary | ICD-10-CM | POA: Insufficient documentation

## 2012-08-12 DIAGNOSIS — Z8614 Personal history of Methicillin resistant Staphylococcus aureus infection: Secondary | ICD-10-CM | POA: Insufficient documentation

## 2012-08-12 DIAGNOSIS — Z87448 Personal history of other diseases of urinary system: Secondary | ICD-10-CM | POA: Insufficient documentation

## 2012-08-12 DIAGNOSIS — F341 Dysthymic disorder: Secondary | ICD-10-CM | POA: Insufficient documentation

## 2012-08-12 DIAGNOSIS — Z79899 Other long term (current) drug therapy: Secondary | ICD-10-CM | POA: Insufficient documentation

## 2012-08-12 LAB — COMPREHENSIVE METABOLIC PANEL
ALT: 12 U/L (ref 0–35)
AST: 9 U/L (ref 0–37)
Albumin: 1.8 g/dL — ABNORMAL LOW (ref 3.5–5.2)
Alkaline Phosphatase: 208 U/L — ABNORMAL HIGH (ref 39–117)
Chloride: 95 mEq/L — ABNORMAL LOW (ref 96–112)
Potassium: 4.6 mEq/L (ref 3.5–5.1)
Total Bilirubin: 0.1 mg/dL — ABNORMAL LOW (ref 0.3–1.2)

## 2012-08-12 LAB — CBC WITH DIFFERENTIAL/PLATELET
Basophils Absolute: 0.1 10*3/uL (ref 0.0–0.1)
HCT: 33.4 % — ABNORMAL LOW (ref 36.0–46.0)
Lymphocytes Relative: 7 % — ABNORMAL LOW (ref 12–46)
Lymphs Abs: 0.8 10*3/uL (ref 0.7–4.0)
Monocytes Absolute: 0.6 10*3/uL (ref 0.1–1.0)
Neutro Abs: 9.3 10*3/uL — ABNORMAL HIGH (ref 1.7–7.7)
Platelets: 313 10*3/uL (ref 150–400)
RBC: 3.32 MIL/uL — ABNORMAL LOW (ref 3.87–5.11)
RDW: 21.2 % — ABNORMAL HIGH (ref 11.5–15.5)
WBC: 11 10*3/uL — ABNORMAL HIGH (ref 4.0–10.5)

## 2012-08-12 LAB — GLUCOSE, CAPILLARY: Glucose-Capillary: 139 mg/dL — ABNORMAL HIGH (ref 70–99)

## 2012-08-12 MED ORDER — PROMETHAZINE HCL 25 MG/ML IJ SOLN
25.0000 mg | Freq: Once | INTRAMUSCULAR | Status: AC
  Administered 2012-08-12: 25 mg via INTRAVENOUS
  Filled 2012-08-12: qty 1

## 2012-08-12 MED ORDER — HYDROMORPHONE HCL PF 1 MG/ML IJ SOLN
1.0000 mg | Freq: Once | INTRAMUSCULAR | Status: AC
  Administered 2012-08-12: 1 mg via INTRAVENOUS
  Filled 2012-08-12: qty 1

## 2012-08-12 MED ORDER — SODIUM CHLORIDE 0.9 % IV SOLN
Freq: Once | INTRAVENOUS | Status: AC
  Administered 2012-08-12: 18:00:00 via INTRAVENOUS

## 2012-08-12 NOTE — ED Notes (Signed)
No vomiting since waiting to be eval

## 2012-08-12 NOTE — ED Provider Notes (Addendum)
History     CSN: [id number]  Arrival date & time 08/12/12  1412   First MD Initiated Contact with Patient 08/12/12 1721      Chief Complaint  Patient presents with  . Emesis    (Consider location/radiation/quality/duration/timing/severity/associated sxs/prior treatment) HPI Comments: The patient is a 41 year old woman who has a 3 day nausea, vomiting or pain. She is a diabetic who has known diabetic gastroparesis or her mother. She had had aortofemoral bypass grafting earlier this year, had of her right valve graft excised to the level because of infection. She was in spell today because of abdominal pain and therefore was brought to [facility] ED for evaluation.  Patient is a 41 y.o. female presenting with abdominal pain. The history is provided by the patient and medical records. No language interpreter was used.  Abdominal Pain The primary symptoms of the illness include abdominal pain, nausea and vomiting. The primary symptoms of the illness do not include fever or diarrhea. The current episode started more than 2 days ago. The onset of the illness was gradual. The problem has been gradually worsening.  The abdominal pain began more than 2 days ago. The pain came on gradually. The abdominal pain has been gradually worsening since its onset. The abdominal pain is located in the RLQ. The abdominal pain does not radiate. The severity of the abdominal pain is 9/10. The abdominal pain is relieved by nothing. The abdominal pain is exacerbated by vomiting.  Associated with: Nothing. The patient states that she believes she is currently not pregnant. The patient has not had a change in bowel habit. Risk factors for an acute abdominal problem include a history of abdominal surgery. Symptoms associated with the illness do not include chills. Significant associated medical issues include diabetes.    Past Medical History  Diagnosis Date  . DVT (deep venous thrombosis)     DVT HISTORY  .  Diabetes mellitus   . Migraine     HISTORY  . CAD (coronary artery disease)     Last cath 2009:  LAD stent 80% stenosis. Circumflex 60-70% stenosis AV groove, right coronary artery 50-60% stenosis. She did have cutting balloon angioplasty of the LAD lesion into a diagonal. However, there was restenosis of this and it was managed medically.  . Hyperlipidemia   . Hyperparathyroidism   . PE (pulmonary embolism)     HISTORY  . Dialysis patient   . Anemia   . Renal failure     dialysis [place] [facility]  . Nephrotic syndrome   . Peripheral vascular disease   . MRSA infection   . Anxiety   . Gastroparesis   . Complication of anesthesia   . PONV (postoperative nausea and vomiting)     x3 days post anesth. on 12/19/2011  . Myocardial infarction     x3 , last one - 10 yrs. ago  . Hypertension     sees Dr. Antoine Poche - [place], last stress test 11/2011, see result in EPIC, saw Dr. Antoine Poche as well at that time    . Shortness of breath   . Recurrent upper respiratory infection (URI)     Bronchitis 11/2011 - saw Dr. Loney Hering, treating currently /w antibiotic   . GERD (gastroesophageal reflux disease)   . Pneumonia     [facility]- 2012  . Noncompliance of patient with renal dialysis   . Cellulitis Oct. 2013  . MRSA (methicillin resistant staph aureus) culture positive Oct 2013  . Anxiety and depression   .  Bipolar 1 disorder   . Depression   . Sleep apnea   . Oxygen dependent     2 L continuous    Past Surgical History  Procedure Date  . Incise and drain abcess     OF THIGHS FROM INSULIN INJECTIONS  . Heart stents   . Portacath placement 2003  . Cataract extraction w/phaco 11/09/2011    Procedure: CATARACT EXTRACTION PHACO AND INTRAOCULAR LENS PLACEMENT (IOC);  Surgeon: Edmon Crape, MD;  Location: [facility] OR;  Service: Ophthalmology;  Laterality: Right;  . Pars plana vitrectomy 11/09/2011    Procedure: PARS PLANA VITRECTOMY WITH 23 GAUGE;  Surgeon: Edmon Crape, MD;  Location: [facility] OR;  Service:  Ophthalmology;  Laterality: Right;  Ahmed Valve; Scleral Reinforcement Graft  . Dg av dialysis  shunt access exist*l* or 12/13/11    left arm  . Dialysis cath inserted & portacath      dialysis catheter inserted & portacath d/c'd- 12/19/2011  . Back surgery X 4    x4 times   . Cholecystectomy   . Eye surgery   . Cardiac catheterization   . Aorta - bilateral femoral artery bypass graft 12/28/2011    Procedure: AORTA BIFEMORAL BYPASS GRAFT;  Surgeon: Nada Libman, MD;  Location: [facility] OR;  Service: Vascular;  Laterality: N/A;  AortoBifemoral Bypass using hemasheild 14x7 graft  . Femoral-popliteal bypass graft 12/28/2011    Procedure: BYPASS GRAFT FEMORAL-POPLITEAL ARTERY;  Surgeon: Nada Libman, MD;  Location: [facility] OR;  Service: Vascular;  Laterality: Right;  right femoral popliteal bypass using 6x80 propaten graft  . Insertion of dialysis catheter 02/15/2012    Procedure: INSERTION OF DIALYSIS CATHETER;  Surgeon: Chuck Hint, MD;  Location: [facility] OR;  Service: Vascular;  Laterality: Right;  insertion of dialysis catheter right internal jugular vein,removal of left internal jugular vein dialysis catheter  . Insertion of dialysis catheter 02/21/2012    Procedure: INSERTION OF DIALYSIS CATHETER;  Surgeon: Chuck Hint, MD;  Location: [facility] OR;  Service: Cardiovascular;  Laterality: N/A;  Insertion new dialysis catheter; possible venoplasty of fibrin sheath of  Right IJ catheter  . Venogram 02/21/2012    Procedure: VENOGRAM;  Surgeon: Chuck Hint, MD;  Location: [facility] OR;  Service: Cardiovascular;  Laterality: N/A;  possible venoplasty  . Insertion of dialysis catheter 04/23/2012    Procedure: INSERTION OF DIALYSIS CATHETER;  Surgeon: Sherren Kerns, MD;  Location: [facility] OR;  Service: Vascular;  Laterality: Right;  using a 24fr. x 23 cannon catheter in Right Internal Jugular  . Esophagogastroduodenoscopy 06/21/2012    Procedure: ESOPHAGOGASTRODUODENOSCOPY (EGD);  Surgeon: Charna Elizabeth, MD;   Location: [facility] ENDOSCOPY;  Service: Endoscopy;  Laterality: N/A;  bedside  . Avgg removal 07/18/2012    Left arm  . Avgg removal 07/18/2012    Procedure: REMOVAL OF ARTERIOVENOUS GORETEX GRAFT (AVGG);  Surgeon: Chuck Hint, MD;  Location: [facility] OR;  Service: Vascular;  Laterality: Left;  . Patch angioplasty 07/18/2012    Procedure: PATCH ANGIOPLASTY;  Surgeon: Chuck Hint, MD;  Location: [facility] OR;  Service: Vascular;  Laterality: Left;  with 1cm x 6cm vascu guard brachial patch angioplasty    Family History  Problem Relation Age of Onset  . Transient ischemic attack Mother   . Hepatitis Sister   . Diabetes type II Other   . Anesthesia problems Neg Hx   . Hypotension Neg Hx   . Malignant hyperthermia Neg Hx   . Pseudochol deficiency Neg Hx  History  Substance Use Topics  . Smoking status: Current Every Day Smoker -- 3.0 packs/day for 20 years    Types: Cigarettes  . Smokeless tobacco: Never Used     Comment: pt states she is not ready to quit   . Alcohol Use: No    OB History    Grav Para Term Preterm Abortions TAB SAB Ect Mult Living                  Review of Systems  Constitutional: Negative for fever and chills.  HENT: Negative.   Eyes: Negative.   Respiratory: Negative.   Cardiovascular: Negative.   Gastrointestinal: Positive for nausea, vomiting and abdominal pain. Negative for diarrhea.  Genitourinary:       Known renal failure, on dialysis.  Musculoskeletal: Negative.   Skin: Negative.   Neurological: Negative.   Psychiatric/Behavioral: Negative.     Allergies  Compazine; Contrast media; Meperidine hcl; Metformin and related; Morphine; Nubain; Tobramycin; Topiramate; and Toradol  Home Medications   Current Outpatient Rx  Name  Route  Sig  Dispense  Refill  . ALBUTEROL SULFATE HFA 108 (90 BASE) MCG/ACT IN AERS   Inhalation   Inhale 2 puffs into the lungs every 6 (six) hours as needed. Shortness of breath         . ALPRAZOLAM 1 MG PO  TABS   Oral   Take 1 tablet (1 mg total) by mouth 3 (three) times daily as needed. For anxiety   60 tablet   0   . AMOXICILLIN-POT CLAVULANATE 400-57 MG/5ML PO SUSR               . ARIPIPRAZOLE 2 MG PO TABS   Oral   Take 10 mg by mouth daily.          Marland Kitchen NEPHRO-VITE 0.8 MG PO TABS   Oral   Take 0.8 mg by mouth at bedtime.         Marland Kitchen BACITRACIN 500 UNIT/GM OP OINT   Both Eyes   Place 1 application into both eyes at bedtime. apply to eye         . BUTALBITAL-APAP-CAFFEINE 50-325-40 MG PO TABS   Oral   Take 1 tablet by mouth 3 (three) times daily as needed for headache. For headaches   45 tablet   0   . CINACALCET HCL 30 MG PO TABS   Oral   Take 30 mg by mouth daily with supper.          Marland Kitchen CLOPIDOGREL BISULFATE 75 MG PO TABS   Oral   Take 75 mg by mouth daily. Last dose 12/17/2011         . DARBEPOETIN ALFA-POLYSORBATE 60 MCG/0.3ML IJ SOLN   Intravenous   Inject 60 mcg into the vein every Saturday with hemodialysis.         Marland Kitchen FLUTICASONE PROPIONATE 50 MCG/ACT NA SUSP   Nasal   Place 1 spray into the nose daily.         . FUROSEMIDE 40 MG PO TABS   Oral   Take 40 mg by mouth daily.          . INSULIN GLARGINE 100 UNIT/ML [place] SOLN   Subcutaneous   Inject 33 Units into the skin at bedtime.         . INSULIN LISPRO (HUMAN) 100 UNIT/ML [place] SOLN   Subcutaneous   Inject 7-15 Units into the skin 4 (four) times daily -  before meals and  at bedtime. TAKING ON SLIDING SCALE 200-249=7 units 250-299=10 units 300-349=12 units >350=15 units         . METHOCARBAMOL 500 MG PO TABS   Oral   Take 500 mg by mouth 2 (two) times daily.          Marland Kitchen METOCLOPRAMIDE HCL 10 MG PO TABS   Oral   Take 10 mg by mouth 4 (four) times daily.         Marland Kitchen MUPIROCIN 2 % EX OINT   Topical   Apply 1 application topically daily.         Marland Kitchen NEPRO PO LIQD   Oral   Take 237 mLs by mouth 3 (three) times daily with meals as needed.      0   . OFLOXACIN 0.3 % OP SOLN    Both Eyes   Place 1 drop into both eyes 3 (three) times daily.          Marland Kitchen OMEPRAZOLE 40 MG PO CPDR   Oral   Take 40 mg by mouth daily.          . OXYCODONE-ACETAMINOPHEN 10-325 MG PO TABS   Oral   Take 1 tablet by mouth every 4 (four) hours as needed. For pain   20 tablet   0   . PAROXETINE HCL 40 MG PO TABS   Oral   Take 80 mg by mouth every morning.          Marland Kitchen PROMETHAZINE HCL 25 MG PO TABS   Oral   Take 25 mg by mouth every 6 (six) hours as needed. For nausea         . PYRIDOXINE HCL 100 MG PO TABS   Oral   Take 100 mg by mouth daily.         Marland Kitchen SEVELAMER CARBONATE 800 MG PO TABS   Oral   Take 1,600-4,000 mg by mouth 5 (five) times daily. *Take 5 tablets daily with each meal and take 2 tablets with snacks**         . SULFAMETHOXAZOLE-TMP DS 800-160 MG PO TABS   Oral   Take 1 tablet by mouth 2 (two) times daily.          Marland Kitchen ULTICARE INSULIN SYRINGE 31G X 5/16" 1 ML MISC                 BP 126/45  Pulse 89  Temp 98.1 F (36.7 C) (Oral)  Resp 18  Ht 5' 10.5" (1.791 m)  Wt 188 lb (85.276 kg)  BMI 26.59 kg/m2  SpO2 100%  LMP 09/03/2008  Physical Exam  Nursing note and vitals reviewed. Constitutional: She is oriented to person, place, and time. She appears well-developed and well-nourished.       In mild-moderated distress complaining of abdominal pain, nausea and vomiting.  HENT:  Head: Normocephalic and atraumatic.  Right Ear: External ear normal.  Left Ear: External ear normal.  Mouth/Throat: Oropharynx is clear and moist.  Eyes: Conjunctivae normal and EOM are normal. Pupils are equal, round, and reactive to light. No scleral icterus.  Neck: Normal range of motion. Neck supple.  Cardiovascular: Normal rate, regular rhythm and normal heart sounds.   Pulmonary/Chest: Effort normal and breath sounds normal.  Abdominal: Soft. Bowel sounds are normal.  Musculoskeletal: Normal range of motion. She exhibits tenderness. She exhibits no edema.   Neurological: She is alert and oriented to person, place, and time.       No sensory or  motor deficit.  Skin: Skin is warm and dry.  Psychiatric: She has a normal mood and affect. Her behavior is normal.    ED Course  Procedures (including critical care time)  Labs Reviewed  GLUCOSE, CAPILLARY - Abnormal; Notable for the following:    Glucose-Capillary 181 (*)     All other components within normal limits  GLUCOSE, CAPILLARY - Abnormal; Notable for the following:    Glucose-Capillary 139 (*)     All other components within normal limits  CBC WITH DIFFERENTIAL  COMPREHENSIVE METABOLIC PANEL  LIPASE, BLOOD  URINALYSIS, ROUTINE W REFLEX MICROSCOPIC   5:46 PM Pt seen --> physical exam performed.  Lab workup ordered.  Limited IV fluids ordered; IV Dilaudid and Phenergan ordered.  Old charts reviewed.  6:19 PM  Date: 08/12/2012  Rate: 92  Rhythm: normal sinus rhythm and sinus arrhythmia  QRS Axis: left  Intervals: normal QRS:  Poor R wave progression in precordial leads suggests old anterior myocardial infarction.  ST/T Wave abnormalities: normal  Conduction Disutrbances:none  Narrative Interpretation: Abnormal EKG  Old EKG Reviewed: changes noted--rate was more rapid on 06/19/2012.  7:46 PM Results for orders placed during the hospital encounter of 08/12/12  GLUCOSE, CAPILLARY      Component Value Range   Glucose-Capillary 181 (*) 70 - 99 mg/dL   Comment 1 Notify RN    GLUCOSE, CAPILLARY      Component Value Range   Glucose-Capillary 139 (*) 70 - 99 mg/dL  CBC WITH DIFFERENTIAL      Component Value Range   WBC 11.0 (*) 4.0 - 10.5 K/uL   RBC 3.32 (*) 3.87 - 5.11 MIL/uL   Hemoglobin 10.1 (*) 12.0 - 15.0 g/dL   HCT 16.1 (*) 09.6 - 04.5 %   MCV 100.6 (*) 78.0 - 100.0 fL   MCH 30.4  26.0 - 34.0 pg   MCHC 30.2  30.0 - 36.0 g/dL   RDW 40.9 (*) 81.1 - 91.4 %   Platelets 313  150 - 400 K/uL   Neutrophils Relative 85 (*) 43 - 77 %   Neutro Abs 9.3 (*) 1.7 - 7.7 K/uL    Lymphocytes Relative 7 (*) 12 - 46 %   Lymphs Abs 0.8  0.7 - 4.0 K/uL   Monocytes Relative 5  3 - 12 %   Monocytes Absolute 0.6  0.1 - 1.0 K/uL   Eosinophils Relative 2  0 - 5 %   Eosinophils Absolute 0.2  0.0 - 0.7 K/uL   Basophils Relative 1  0 - 1 %   Basophils Absolute 0.1  0.0 - 0.1 K/uL   RBC Morphology POLYCHROMASIA PRESENT    COMPREHENSIVE METABOLIC PANEL      Component Value Range   Sodium 131 (*) 135 - 145 mEq/L   Potassium 4.6  3.5 - 5.1 mEq/L   Chloride 95 (*) 96 - 112 mEq/L   CO2 19  19 - 32 mEq/L   Glucose, Bld 122 (*) 70 - 99 mg/dL   BUN 51 (*) 6 - 23 mg/dL   Creatinine, Ser 7.82 (*) 0.50 - 1.10 mg/dL   Calcium 8.8  8.4 - 95.6 mg/dL   Total Protein 6.4  6.0 - 8.3 g/dL   Albumin 1.8 (*) 3.5 - 5.2 g/dL   AST 9  0 - 37 U/L   ALT 12  0 - 35 U/L   Alkaline Phosphatase 208 (*) 39 - 117 U/L   Total Bilirubin 0.1 (*) 0.3 - 1.2 mg/dL  GFR calc non Af Amer 8 (*) >90 mL/min   GFR calc Af Amer 9 (*) >90 mL/min  LIPASE, BLOOD      Component Value Range   Lipase 10 (*) 11 - 59 U/L   Dg Abd Acute W/chest  08/12/2012  *RADIOLOGY REPORT*  Clinical Data: Right lower quadrant pain, nausea/vomiting  ACUTE ABDOMEN SERIES (ABDOMEN 2 VIEW & CHEST 1 VIEW)  Comparison: Chest radiographs dated 07/18/2012  Findings: Possible 2.2 cm left upper lobe lung nodule, although this overlies the left anterior 3rd rib, and may be osseous in etiology.  Similar (but less conspicuous) appearance involving multiple right anterior ribs.  Prior interstitial markings without frank interstitial edema. No pleural effusion or pneumothorax.  Mild cardiomegaly.  Stable right IJ dual lumen dialysis catheter.  Nonspecific bowel gas pattern without disproportionate small bowel obstruction to suggest small bowel obstruction.  No evidence of free air under the diaphragm on the upright view.  Cholecystectomy clips.  Lumbar spine fixation hardware.  IMPRESSION: Possible 2.2 cm left upper lobe lung nodule, although this may  be osseous in etiology.  Nonemergent CT chest is suggested for further characterization.  No evidence of small bowel obstruction or free air.   Original Report Authenticated By: Charline Bills, M.D.    Pt resting comfortably at present.  She had had 3 days of vomiting, and her renal function is worse than usual.  Will request [facility] to admit her for vomiting, gastroparesis, renal failure.  8:26 PM Pt's case discussed with Dr. Vania Rea.  He requests that CT of chest be ordered.  He will plan to evaluate her after her CT scan.  10:14 PM Pt refused the chest CT.    1. Vomiting   2. Diabetic gastroparesis   3. End stage renal failure on dialysis          Carleene Cooper III, MD 08/12/12 2030    Carleene Cooper III, MD 08/12/12 2214     Carleene Cooper III, MD 09/04/12 [phone]

## 2012-08-12 NOTE — ED Notes (Signed)
Complain of n/v/d for three days and pain in RLQ

## 2012-08-12 NOTE — ED Notes (Signed)
Patient is resting comfortably. 

## 2012-08-13 NOTE — ED Notes (Signed)
Patient lying on left side with eyes closed. Respirations even and unlabored.  

## 2012-08-13 NOTE — ED Notes (Signed)
Discharge instructions given and reviewed with patient.  Patient verbalized understanding to follow up with Dr. Loney Hering.

## 2012-08-13 NOTE — ED Notes (Signed)
Dr. Orvan Falconer notified that patient is now awake.  Dr. Orvan Falconer states he did not accept the patient for admission.  Dr. Preston Fleeting and Dr. Colon Branch aware.

## 2012-08-13 NOTE — ED Notes (Signed)
Dr. Colon Branch at bedside to speak with patient.

## 2012-08-13 NOTE — ED Notes (Signed)
Patient now awake and requesting pain medication.

## 2012-08-13 NOTE — Progress Notes (Signed)
0050 Assumed care of patient. Patient with h/o DM, ESRD on dialysis [facility], here with abdominal pain and vomiting. H/o gastroparesis. Hospitalist had been consulted for admission. 0105 Patient to be discharged home. Hospitalist will not be admitting. Patient is awake and alert. Currently her abdominal pain is at baseline. No vomiting. VSS. Cor:  RRR, Resp: clear all fields Abd: soft non tender, +BS, Ext: ACE wrap to left arm. Discharge papers completed.

## 2012-08-15 ENCOUNTER — Other Ambulatory Visit (HOSPITAL_COMMUNITY): Payer: Self-pay | Admitting: Nephrology

## 2012-08-15 ENCOUNTER — Ambulatory Visit (HOSPITAL_COMMUNITY)
Admission: RE | Admit: 2012-08-15 | Discharge: 2012-08-15 | Disposition: A | Discharge status: Home / Self Care | Payer: Medicare Other | Source: Ambulatory Visit | Attending: Nephrology | Admitting: Nephrology

## 2012-08-15 DIAGNOSIS — N186 End stage renal disease: Secondary | ICD-10-CM | POA: Insufficient documentation

## 2012-08-15 DIAGNOSIS — Z4901 Encounter for fitting and adjustment of extracorporeal dialysis catheter: Secondary | ICD-10-CM | POA: Insufficient documentation

## 2012-08-15 DIAGNOSIS — Z992 Dependence on renal dialysis: Secondary | ICD-10-CM | POA: Insufficient documentation

## 2012-08-15 MED ORDER — CHLORHEXIDINE GLUCONATE 4 % EX LIQD
CUTANEOUS | Status: AC
  Filled 2012-08-15: qty 45

## 2012-08-15 MED ORDER — CEFAZOLIN SODIUM-DEXTROSE 2-3 GM-% IV SOLR
INTRAVENOUS | Status: AC
  Filled 2012-08-15: qty 50

## 2012-08-15 MED ORDER — HEPARIN SODIUM (PORCINE) 1000 UNIT/ML IJ SOLN
INTRAMUSCULAR | Status: AC
  Filled 2012-08-15: qty 1

## 2012-08-15 MED ORDER — CEFAZOLIN SODIUM-DEXTROSE 2-3 GM-% IV SOLR
2.0000 g | Freq: Once | INTRAVENOUS | Status: AC
  Administered 2012-08-15: 2 g via INTRAVENOUS

## 2012-08-15 NOTE — Procedures (Signed)
Successful exchange of right chest tunneled dialysis catheter.  Tip in right atrium and ready to use.  No immediate complication.

## 2012-08-17 IMAGING — CR DG ABDOMEN 2V
2 series · 2 of 2 positions shown · non-contrast
Comparison: 12/28/2010

CLINICAL DATA: Abdominal pain.  Nausea and vomiting.  Renal
failure.

ABDOMEN - 2 VIEW

[view not recorded (1 of 2)]
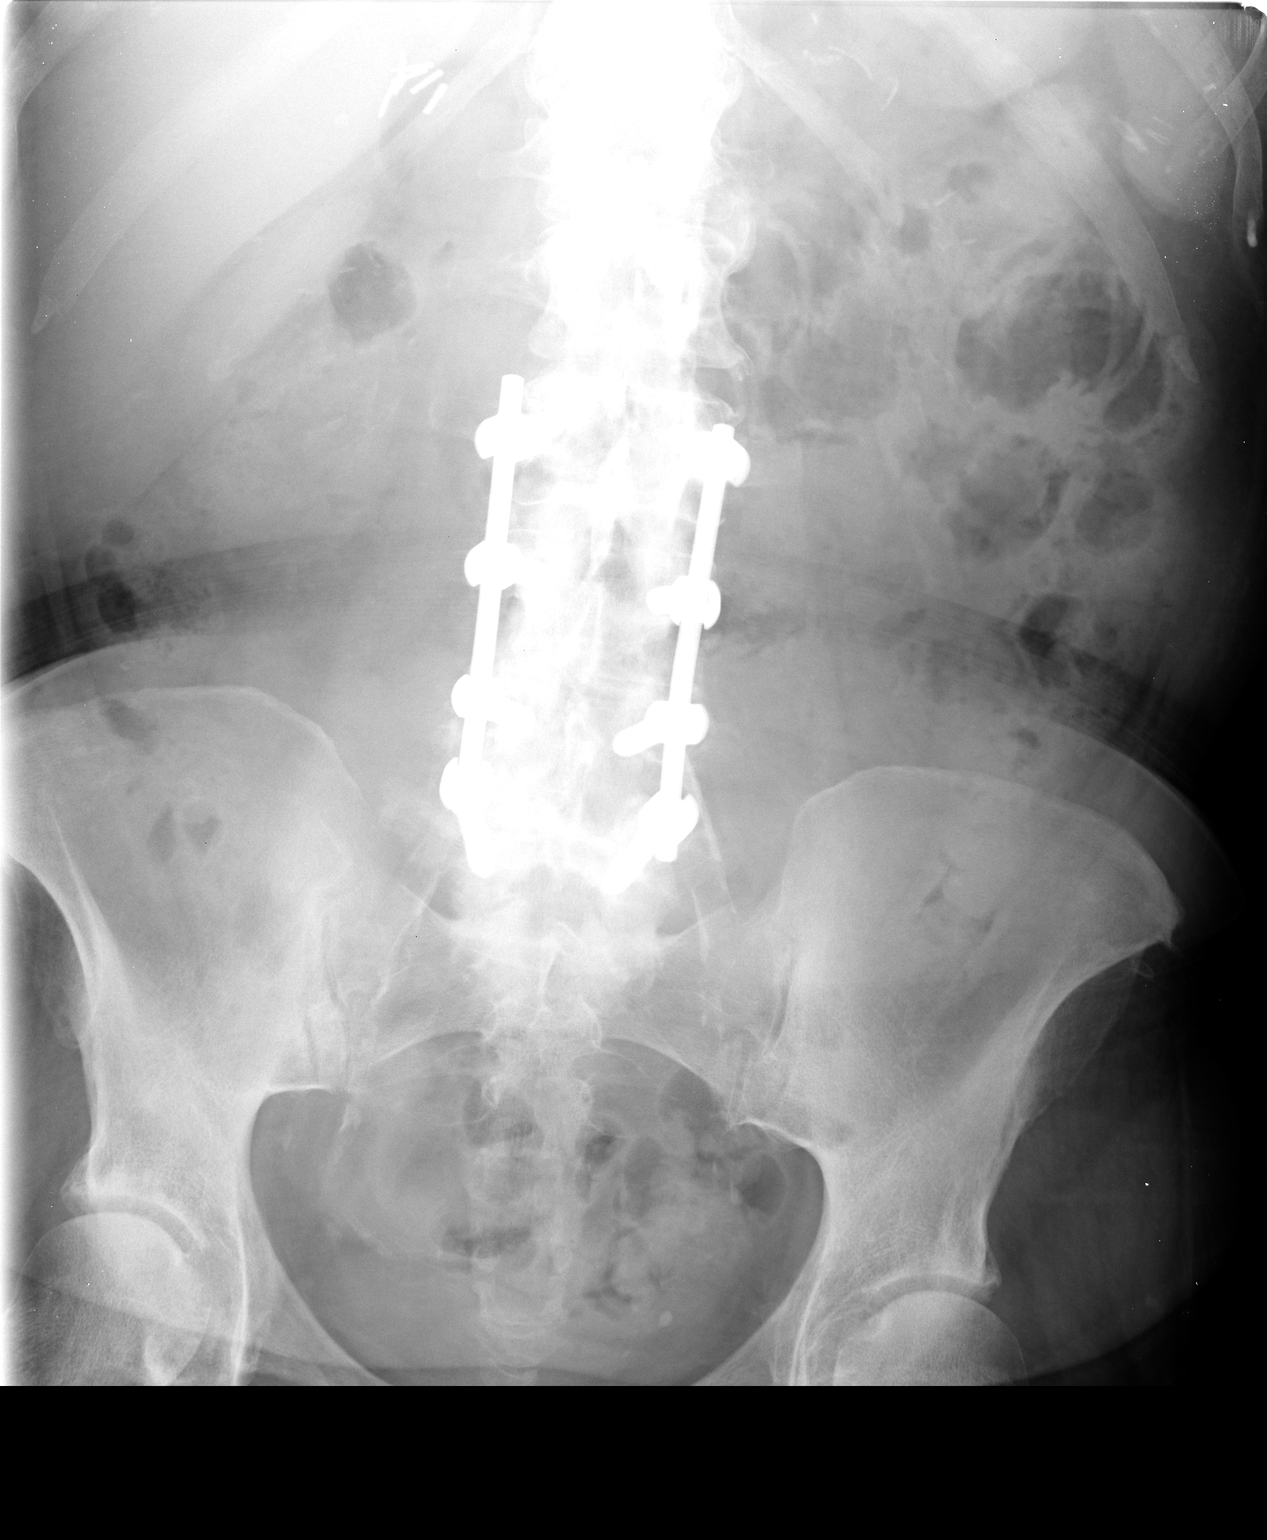

[view not recorded (2 of 2)]
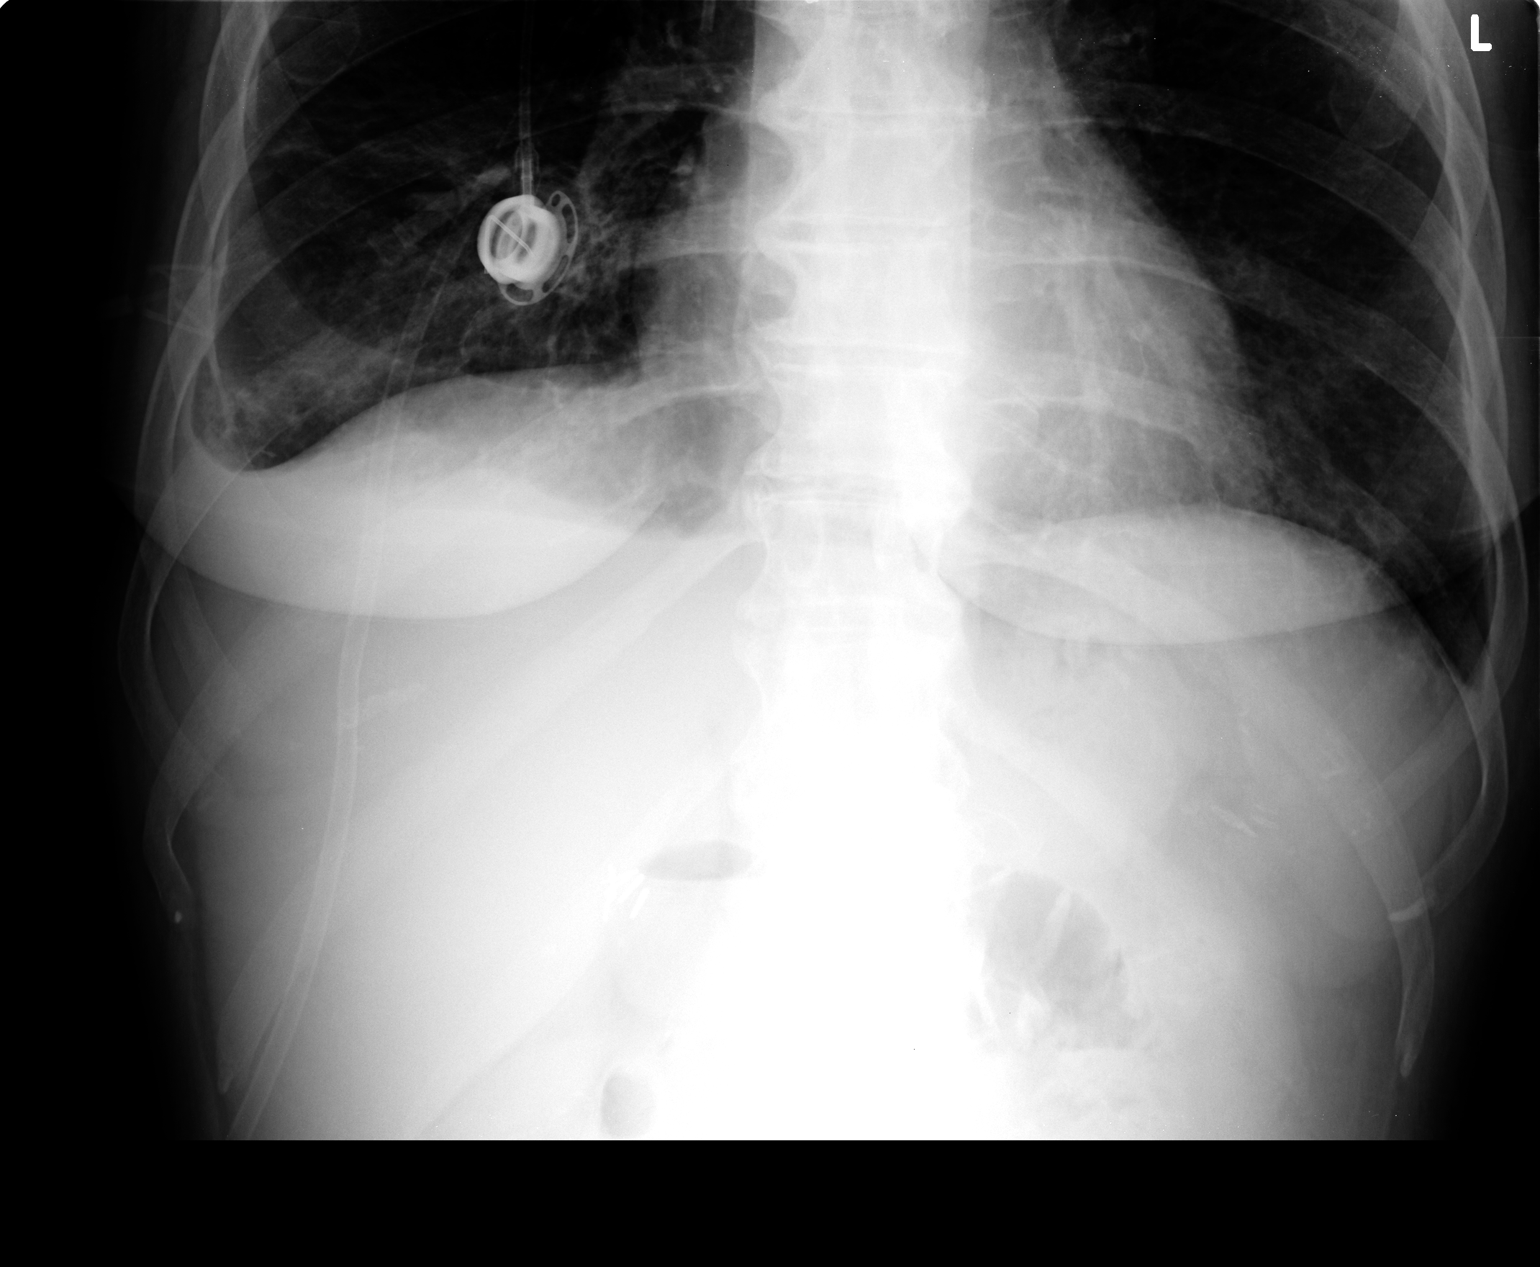

[2 of 2 positions shown; findings below may reference images not displayed]

FINDINGS: Several gas filled nondilated small bowel loops are seen
in the left abdomen containing air fluid levels.  Scattered colonic
gas is seen without evidence of dilatation.  There is no evidence
of free intraperitoneal air.  Small right pleural effusion is
noted.

Surgical clips are seen from prior cholecystectomy and posterior
lumbar spine fusion hardware is noted.  Peripheral vascular
calcification is seen involve the iliac arteries.  Pelvic
phleboliths noted, but no definite radiopaque calculi identified.
IMPRESSION: Nonspecific, nonobstructive bowel gas pattern.

## 2012-08-17 IMAGING — CR DG CHEST 1V PORT
1 series · 1 of 1 positions shown · non-contrast
Comparison: 05/05/2011

CLINICAL DATA: Shortness of breath.

PORTABLE CHEST - 1 VIEW

[view not recorded]
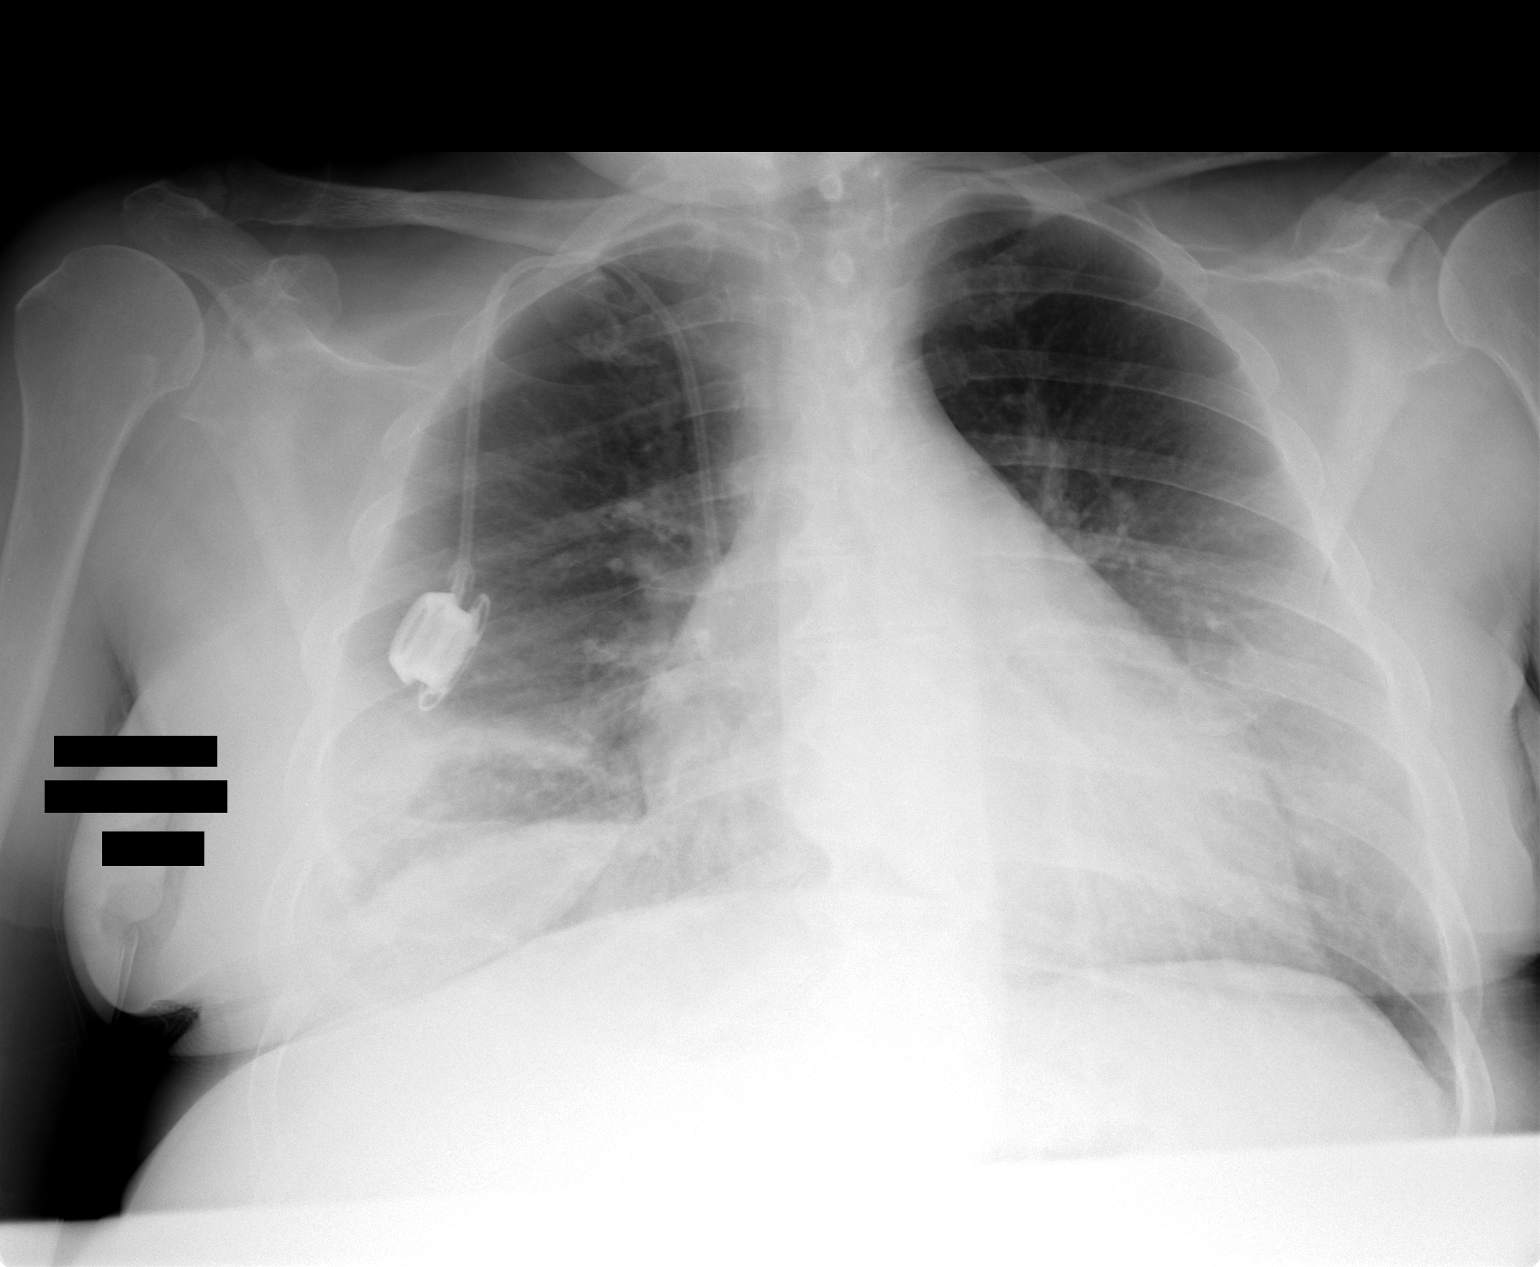

[1 of 1 positions shown; findings below may reference images not displayed]

FINDINGS: Consolidation noted in the right lower lobe compatible
with pneumonia.  Possible small right effusion.  Heart is mildly
enlarged.  Right Port-A-Cath remains in place, unchanged.  Left
lung is clear.  No acute bony abnormality.
IMPRESSION: Right lower lobe consolidation concerning for pneumonia.  Suspect
small right effusion.

## 2012-08-18 ENCOUNTER — Telehealth (HOSPITAL_COMMUNITY): Payer: Self-pay | Admitting: *Deleted

## 2012-08-19 IMAGING — US US ABDOMEN COMPLETE
1 series · 13 of 25 positions shown · non-contrast
Comparison: Renal sonogram 07/05/2009.  CT abdomen pelvis
03/28/2009.

CLINICAL DATA: Abdominal pain.  Nausea.  Post cholecystectomy.

COMPLETE ABDOMINAL ULTRASOUND

[Series 1: us abdomen complete · 0.25mm/px · 13 of 66 slices shown]
[im 1/66]
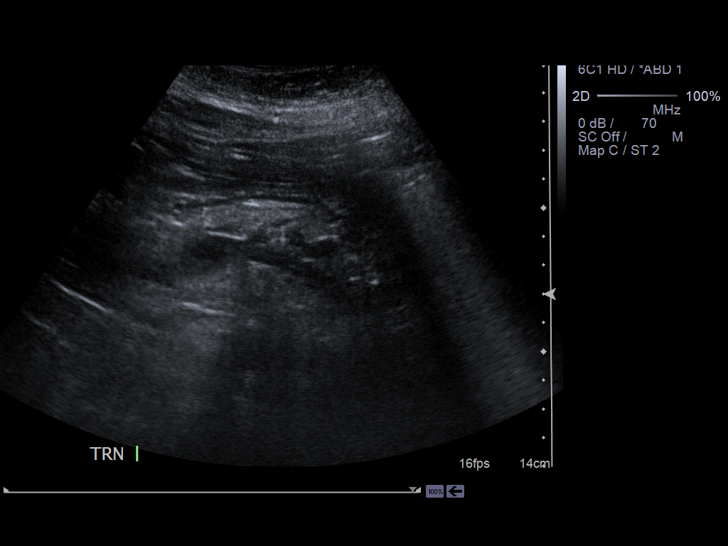
[im 6/66]
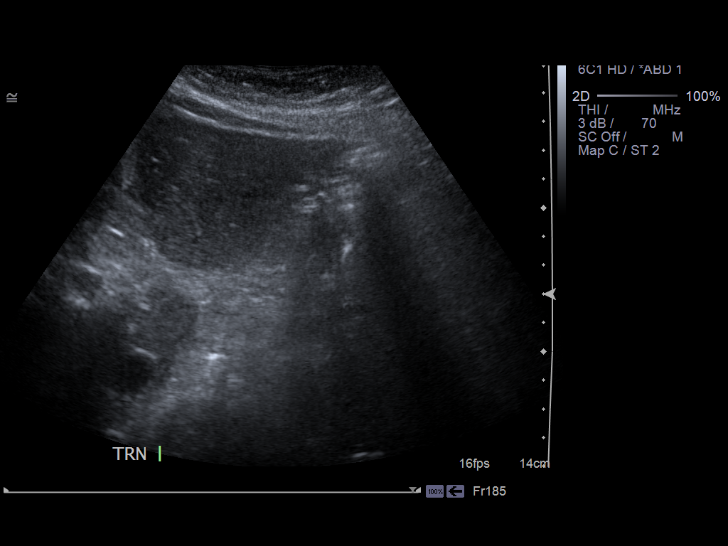
[im 11/66]
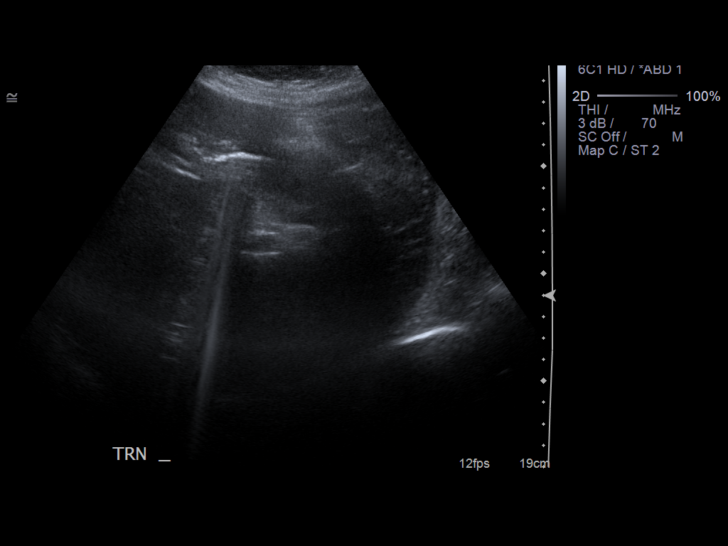
[im 17/66]
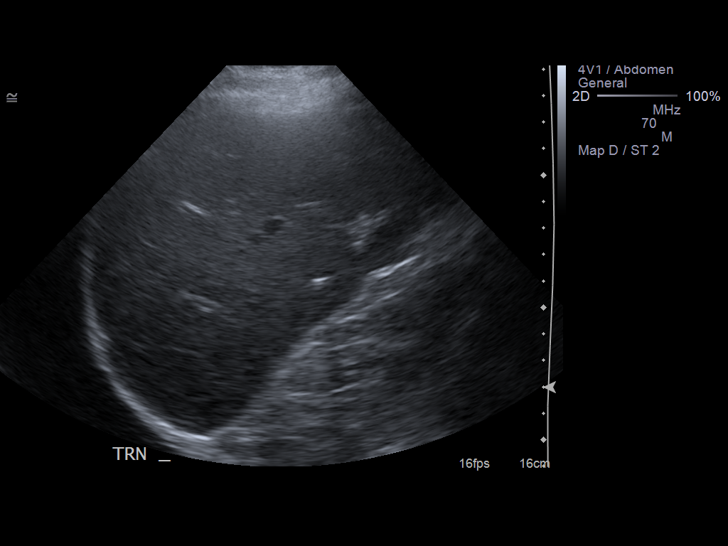
[im 22/66]
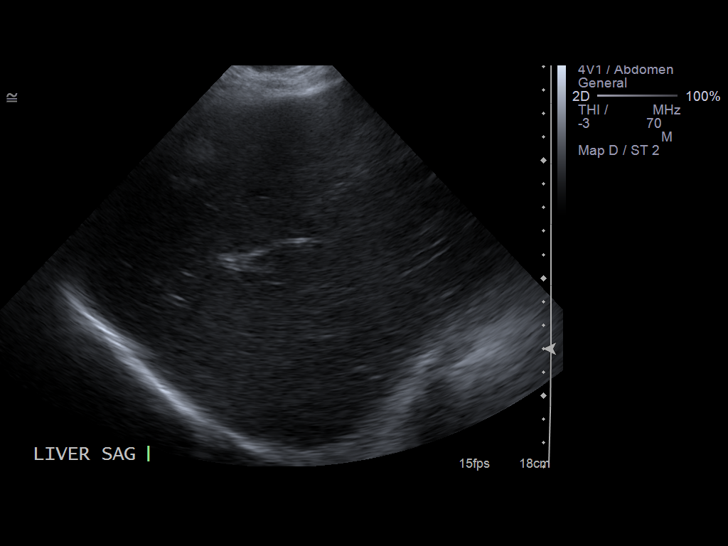
[im 28/66]
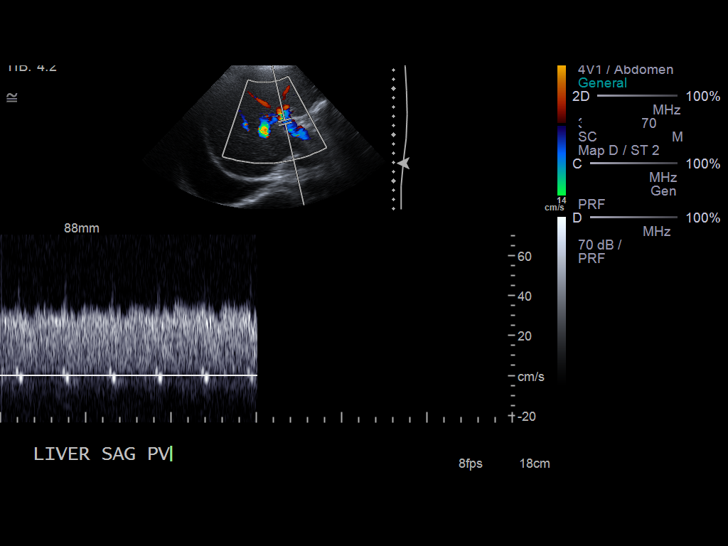
[im 33/66]
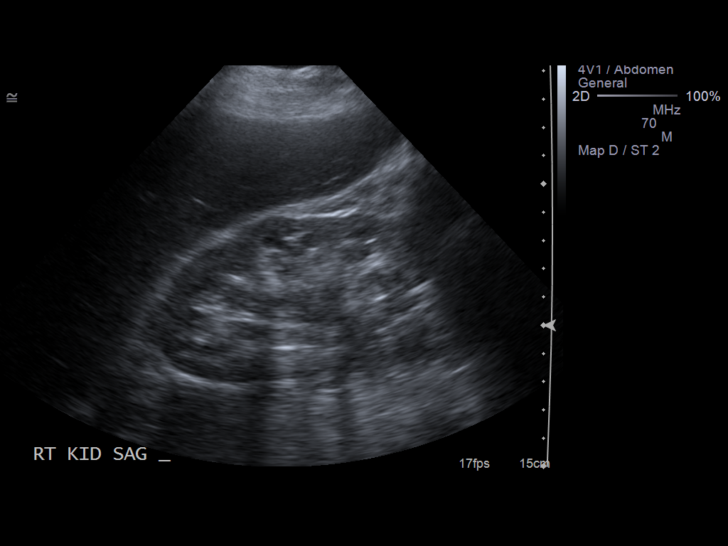
[im 38/66]
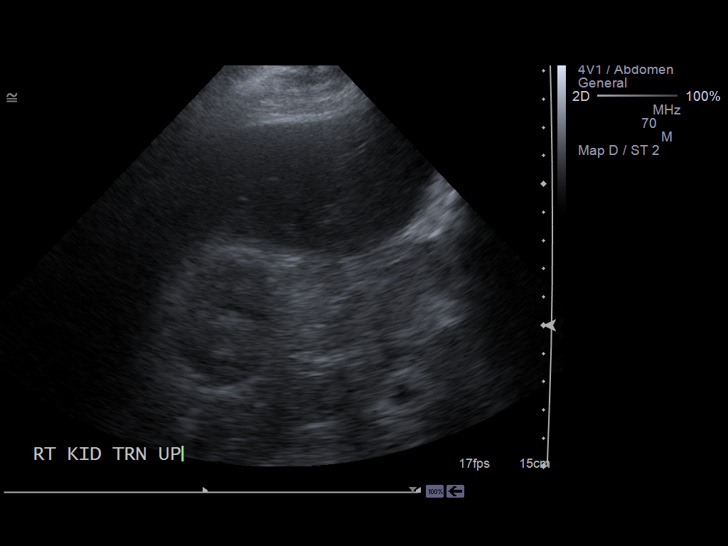
[im 44/66]
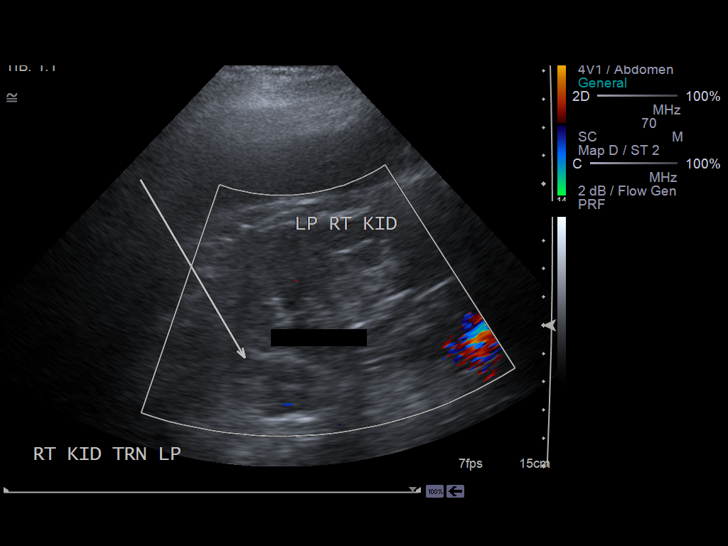
[im 49/66]
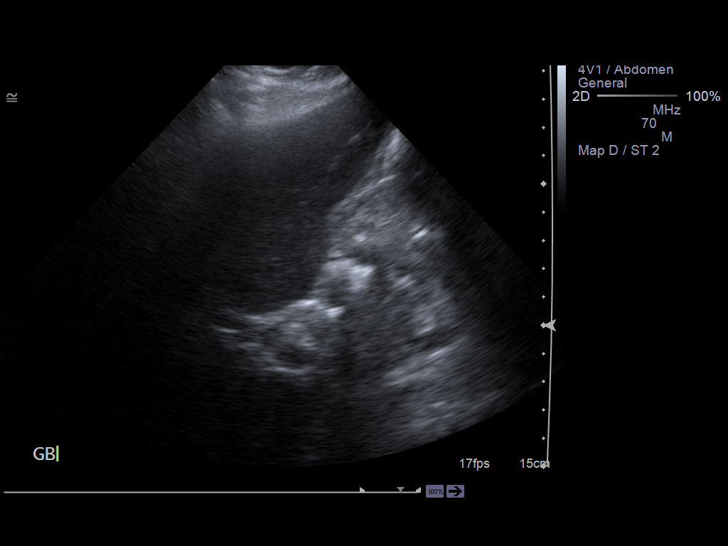
[im 55/66]
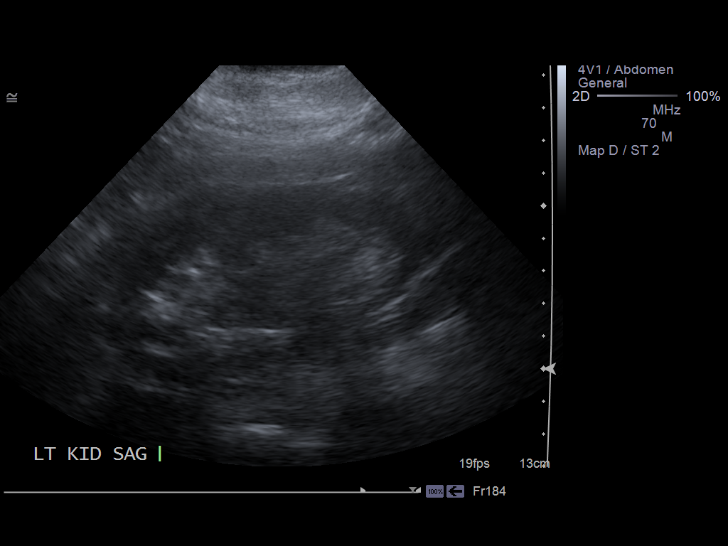
[im 60/66]
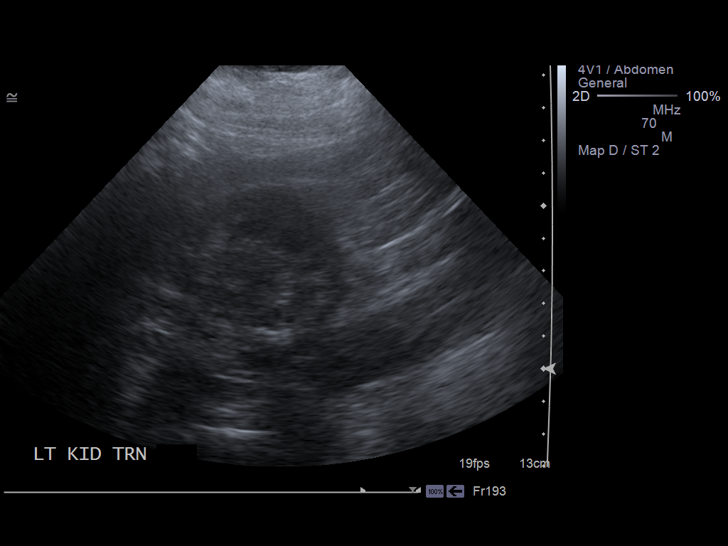
[im 66/66]
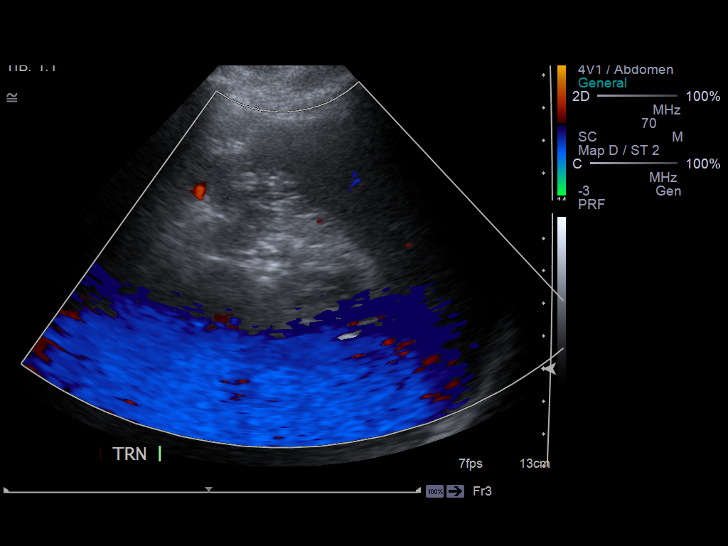

[13 of 25 positions shown; findings below may reference images not displayed]

FINDINGS: Gallbladder:  Post cholecystectomy.

Common bile duct:  10.6 mm proximally.  The mid to distal aspect is
not visualized secondary to overlying bowel gas.

Liver:  Increased echogenicity which may represent fatty
infiltration.  Evaluation limited without obvious mass.

IVC:  Appears normal.

Pancreas:  Evaluation limited by bowel gas.  The portions which are
visualized appear unremarkable.

Spleen:  8.4 cm.  No focal mass.

Right Kidney:  11.6 cm.  No hydronephrosis.  Increased echogenicity
which may represent result of medical renal disease.  Mass adjacent
to the right kidney may represent adrenal enlargement although
renal mass not excluded.

Left Kidney:  10.5 cm.  No hydronephrosis.  Increased echogenicity
renal parenchyma may represent result of medical renal disease type
changes.  No left renal mass identified.  The enlarged left adrenal
gland noted on prior CT is not appreciated/evaluated on the present
exam.

Abdominal aorta:  Not visualized secondary to overlying bowel gas.

Right pleural effusion.
IMPRESSION: Post cholecystectomy.

Prominent common bile duct measuring up to 10.6 cm proximally.  Mid
to distal aspect not visualized.

Secondary to bowel gas, limit evaluation of portions of the
pancreas and abdominal aorta.

Increased renal echogenicity which may represent result of medical
renal disease type changes.  No hydronephrosis.  Question right
adrenal versus right renal mass.

Right-sided pleural effusion.

Suggestion of fatty infiltration of the liver.

## 2012-08-25 ENCOUNTER — Encounter (INDEPENDENT_AMBULATORY_CARE_PROVIDER_SITE_OTHER): Payer: Self-pay | Admitting: Internal Medicine

## 2012-08-25 ENCOUNTER — Ambulatory Visit (INDEPENDENT_AMBULATORY_CARE_PROVIDER_SITE_OTHER): Payer: Medicare Other | Admitting: Internal Medicine

## 2012-08-25 ENCOUNTER — Telehealth: Payer: Self-pay

## 2012-08-25 VITALS — BP 140/80 | HR 76 | Temp 98.4°F | Resp 18 | Ht 70.5 in | Wt 180.0 lb

## 2012-08-25 DIAGNOSIS — K219 Gastro-esophageal reflux disease without esophagitis: Secondary | ICD-10-CM

## 2012-08-25 DIAGNOSIS — K3184 Gastroparesis: Secondary | ICD-10-CM

## 2012-08-25 DIAGNOSIS — E1149 Type 2 diabetes mellitus with other diabetic neurological complication: Secondary | ICD-10-CM

## 2012-08-25 DIAGNOSIS — E1143 Type 2 diabetes mellitus with diabetic autonomic (poly)neuropathy: Secondary | ICD-10-CM

## 2012-08-25 DIAGNOSIS — R112 Nausea with vomiting, unspecified: Secondary | ICD-10-CM

## 2012-08-25 MED ORDER — DEXLANSOPRAZOLE 60 MG PO CPDR: 60.0000 mg | DELAYED_RELEASE_CAPSULE | Freq: Every day | ORAL | Status: AC

## 2012-08-25 MED ORDER — PROMETHAZINE HCL 25 MG PO TABS: 25.0000 mg | ORAL_TABLET | Freq: Two times a day (BID) | ORAL | Status: DC | PRN

## 2012-08-25 MED ORDER — METOCLOPRAMIDE HCL 10 MG PO TABS: 10.0000 mg | ORAL_TABLET | Freq: Four times a day (QID) | ORAL | Status: AC

## 2012-08-25 NOTE — Patient Instructions (Signed)
Take promethazine only on as-needed basis up to twice daily. Try dropping bedtime dose of metoclopramide and take it only 3 times a day. Can go back to 4 times a day if needed. New medication for GERD is Dexlansoprazole instead of Nexium or esomeprazole. Notify if you experience any side effects of metoclopramide.

## 2012-08-25 NOTE — Telephone Encounter (Signed)
Phone call from pt. with report of a "knot" in the crease of left arm.  States knot is "red, warm, and soft."  Denies any pulsation in the knot.  Denies any drainage @ site.  Denies any fever / chills.  States the knot has been present x 2 days.  Requesting appt. for evaluation.  Appt. given for 08/28/12 at 3:00 PM.   Encouraged to go to the ER if symptoms worsen, or if she develops a pulsation in the knot.  Verb. Understanding.

## 2012-08-25 NOTE — Progress Notes (Signed)
Presenting complaint;  Followup for diabetic gastroparesis he is in GERD.  Subjective:  Patient is 41 year old Caucasian female with multiple medical problems was last seen in our office by Ms. Setzer NP three months ago. She is accompanied by her mother and nephew. She wants her medications refilled. She stays omeprazole is not controlling her heartburn. She wants to go back on Nexium which has worked well in the past. She was given prescription on her last visit but apparently she did not fill it. Since her last visit she's been hospitalized twice at [facility]. She was initially admitted in October 2013 with DKA, acute respiratory failure requiring ventilatory support and she also had H&H upper GI bleed secondary to gastritis. Patient's mother tells me that she was coded 3 different times gradually recovered. She was readmitted on 07/18/2012 because of infected AVG and underwent removal of Gore-Tex graft with patch angioplasty. Now she complains of pain at left elbow and soreness at graft in left arm. She has follow up appointment next month. She denies tremors or nervousness. She states she is not having any side effects of metoclopramide. She is using promethazine twice daily for nausea and occasional vomiting. She has been tried on ondansetron but it does not work. She is dialyzed every Tuesday, Thursday and Saturday. She tells me her catheter had to be changed 10 times twice during hospitalization in October 2013.  Current Medications: Current Outpatient Prescriptions  Medication Sig Dispense Refill  . albuterol (PROVENTIL HFA;VENTOLIN HFA) 108 (90 BASE) MCG/ACT inhaler Inhale 2 puffs into the lungs every 6 (six) hours as needed. Shortness of breath      . ALPRAZolam (XANAX) 1 MG tablet Take 1 tablet (1 mg total) by mouth 3 (three) times daily as needed. For anxiety  60 tablet  0  . ARIPiprazole (ABILIFY) 2 MG tablet Take 10 mg by mouth daily.       Marland Kitchen b complex-vitamin c-folic acid (NEPHRO-VITE)  0.8 MG TABS Take 0.8 mg by mouth at bedtime.      . butalbital-acetaminophen-caffeine (FIORICET, ESGIC) 50-325-40 MG per tablet Take 1 tablet by mouth 3 (three) times daily as needed for headache. For headaches  45 tablet  0  . cinacalcet (SENSIPAR) 30 MG tablet Take 30 mg by mouth daily with supper.       . clopidogrel (PLAVIX) 75 MG tablet Take 75 mg by mouth daily. Last dose 12/17/2011      . fluticasone (FLONASE) 50 MCG/ACT nasal spray Place 1 spray into the nose daily.      . furosemide (LASIX) 40 MG tablet Take 40 mg by mouth daily.       . insulin glargine (LANTUS) 100 UNIT/ML injection Inject 33 Units into the skin at bedtime.      . insulin lispro (HUMALOG) 100 UNIT/ML injection Inject 7-15 Units into the skin 4 (four) times daily -  before meals and at bedtime. TAKING ON SLIDING SCALE 200-249=7 units 250-299=10 units 300-349=12 units >350=15 units      . metoCLOPramide (REGLAN) 10 MG tablet Take 10 mg by mouth 4 (four) times daily.      . Nutritional Supplements (NEPRO) LIQD Take 237 mLs by mouth 3 (three) times daily with meals as needed.    0  . oxyCODONE-acetaminophen (PERCOCET) 10-325 MG per tablet Take 1 tablet by mouth every 4 (four) hours as needed. For pain  20 tablet  0  . PARoxetine (PAXIL) 40 MG tablet Take 80 mg by mouth every morning.       Marland Kitchen  promethazine (PHENERGAN) 25 MG tablet Take 25 mg by mouth every 6 (six) hours as needed. For nausea      . pyridoxine (B-6) 100 MG tablet Take 100 mg by mouth daily.      . sevelamer (RENVELA) 800 MG tablet Take 1,600-4,000 mg by mouth 5 (five) times daily. *Take 5 tablets daily with each meal and take 2 tablets with snacks**      . ULTICARE INSULIN SYRINGE 31G X 5/16" 1 ML MISC         Objective: Blood pressure 140/80, pulse 76, temperature 98.4 F (36.9 C), temperature source Oral, resp. rate 18, height 5' 10.5" (1.791 m), weight 180 lb (81.647 kg), last menstrual period 09/03/2008. Patient is alert and appears to be in no acute  distress. She is sitting in her wheelchair and unable to get to examination table. Conjunctiva is pink. Sclera is nonicteric Oropharyngeal mucosa is normal. No neck masses or thyromegaly noted. Abdomen. Extensive scarring. Bowel sounds are normal. Abdomen is soft and nontender she appears to have hepatomegaly.  She does not have any tremors to her extremities, lips or tongue.    Assessment:  #1. Diabetic gastroparesis possibly made worse with narcotic use. She is actually taking less of metoclopramide than she used to. Heartburn is not well controlled with omeprazole and since she is on clopidogrel she should be switched to dexlansoprazole. #2. GERD primarily secondary to gastroparesis. #3. Nausea and vomiting secondary to #1.  Plan:  Discontinue Nexium. Start Dexlansoprazole 60 mg by mouth every morning. New prescription given for metoclopramide 10 mg tablets 120 doses with 2 refills. She will try 3 times a day and if she does well will change prescription to 3 times a day. New prescription for promethazine 25 mg by mouth twice a day when necessary 60 with 2 refills given. Patient advised to use Phenergan only when absolutely necessary. She will call for metoclopramide and promethazine refill in 3 months and return for office visit in 6 months.

## 2012-08-26 ENCOUNTER — Encounter: Payer: Self-pay | Admitting: Neurosurgery

## 2012-08-28 ENCOUNTER — Ambulatory Visit (INDEPENDENT_AMBULATORY_CARE_PROVIDER_SITE_OTHER): Payer: Medicare Other | Admitting: Neurosurgery

## 2012-08-28 ENCOUNTER — Encounter: Payer: Self-pay | Admitting: Neurosurgery

## 2012-08-28 VITALS — BP 117/73 | HR 99 | Resp 18 | Ht 70.5 in | Wt 189.0 lb

## 2012-08-28 DIAGNOSIS — N186 End stage renal disease: Secondary | ICD-10-CM

## 2012-08-28 MED ORDER — CEPHALEXIN 500 MG PO CAPS: 500.0000 mg | ORAL_CAPSULE | Freq: Four times a day (QID) | ORAL | Status: DC

## 2012-08-28 NOTE — Progress Notes (Signed)
Subjective:     Patient ID: [name], female   DOB: 1971/03/15, 42 y.o.   MRN: [id number]  HPI: 42 year old female patient has been seen by multiple providers in this practice for various access issues. The patient called the office asking to be seen due to a "red up" in her left antecubital area was brought in for evaluation. The patient states this has been there for about 2 days and has not been draining. The patient is dialyzing through a right chest catheter. The patient was most recently seen 08/04/2012 by Dr. Myra Gianotti status post aortobifemoral bypass graft with right femoral to popliteal bypass that was done in May of 2013.   Review of Systems: 12 point review of systems is notable for the difficulties described above otherwise unremarkable     Objective:   Physical Exam: Afebrile, vital signs are stable, there is a small reddened area in the left antecubital area about 1 cm in diameter, I do not feel any heat in the area there is no drainage. There is also a small reddened area on her elbow that is not draining.     Assessment:     Questionable possible infection status post excision of a infected dialysis graft.    Plan:     Keflex 500 mg 4 times a day for 10 days we will see the patient back in one week for a wound check. The patient knows to call our office or go to the emergency room if it worsens over the weekend. The patient's questions were encouraged and answered, she is in agreement with this plan.  Lauree Chandler ANP   Clinic M.D.: [place] on call

## 2012-08-30 ENCOUNTER — Emergency Department (HOSPITAL_COMMUNITY): Payer: Medicare Other

## 2012-08-30 ENCOUNTER — Encounter (HOSPITAL_COMMUNITY): Payer: Self-pay | Admitting: *Deleted

## 2012-08-30 ENCOUNTER — Observation Stay (HOSPITAL_COMMUNITY)
Admission: EM | Admit: 2012-08-30 | Discharge: 2012-08-31 | DRG: 689 | Disposition: A | Discharge status: Home / Self Care | Payer: Medicare Other | Attending: Internal Medicine | Admitting: Internal Medicine

## 2012-08-30 DIAGNOSIS — I252 Old myocardial infarction: Secondary | ICD-10-CM

## 2012-08-30 DIAGNOSIS — L02419 Cutaneous abscess of limb, unspecified: Secondary | ICD-10-CM | POA: Diagnosis present

## 2012-08-30 DIAGNOSIS — Z9861 Coronary angioplasty status: Secondary | ICD-10-CM

## 2012-08-30 DIAGNOSIS — L0291 Cutaneous abscess, unspecified: Secondary | ICD-10-CM

## 2012-08-30 DIAGNOSIS — E1110 Type 2 diabetes mellitus with ketoacidosis without coma: Secondary | ICD-10-CM

## 2012-08-30 DIAGNOSIS — F319 Bipolar disorder, unspecified: Secondary | ICD-10-CM | POA: Diagnosis present

## 2012-08-30 DIAGNOSIS — R109 Unspecified abdominal pain: Secondary | ICD-10-CM | POA: Diagnosis present

## 2012-08-30 DIAGNOSIS — Z91041 Radiographic dye allergy status: Secondary | ICD-10-CM

## 2012-08-30 DIAGNOSIS — G5793 Unspecified mononeuropathy of bilateral lower limbs: Secondary | ICD-10-CM | POA: Diagnosis present

## 2012-08-30 DIAGNOSIS — Z6825 Body mass index (BMI) 25.0-25.9, adult: Secondary | ICD-10-CM

## 2012-08-30 DIAGNOSIS — R1110 Vomiting, unspecified: Secondary | ICD-10-CM

## 2012-08-30 DIAGNOSIS — Z886 Allergy status to analgesic agent status: Secondary | ICD-10-CM

## 2012-08-30 DIAGNOSIS — L97919 Non-pressure chronic ulcer of unspecified part of right lower leg with unspecified severity: Secondary | ICD-10-CM

## 2012-08-30 DIAGNOSIS — N2581 Secondary hyperparathyroidism of renal origin: Secondary | ICD-10-CM

## 2012-08-30 DIAGNOSIS — E785 Hyperlipidemia, unspecified: Secondary | ICD-10-CM | POA: Diagnosis present

## 2012-08-30 DIAGNOSIS — K319 Disease of stomach and duodenum, unspecified: Secondary | ICD-10-CM | POA: Diagnosis present

## 2012-08-30 DIAGNOSIS — Z86718 Personal history of other venous thrombosis and embolism: Secondary | ICD-10-CM

## 2012-08-30 DIAGNOSIS — F411 Generalized anxiety disorder: Secondary | ICD-10-CM | POA: Diagnosis present

## 2012-08-30 DIAGNOSIS — Z9981 Dependence on supplemental oxygen: Secondary | ICD-10-CM

## 2012-08-30 DIAGNOSIS — I120 Hypertensive chronic kidney disease with stage 5 chronic kidney disease or end stage renal disease: Secondary | ICD-10-CM | POA: Diagnosis present

## 2012-08-30 DIAGNOSIS — J9601 Acute respiratory failure with hypoxia: Secondary | ICD-10-CM

## 2012-08-30 DIAGNOSIS — F17200 Nicotine dependence, unspecified, uncomplicated: Secondary | ICD-10-CM | POA: Diagnosis present

## 2012-08-30 DIAGNOSIS — R0989 Other specified symptoms and signs involving the circulatory and respiratory systems: Secondary | ICD-10-CM

## 2012-08-30 DIAGNOSIS — N186 End stage renal disease: Secondary | ICD-10-CM | POA: Diagnosis present

## 2012-08-30 DIAGNOSIS — N049 Nephrotic syndrome with unspecified morphologic changes: Secondary | ICD-10-CM | POA: Diagnosis present

## 2012-08-30 DIAGNOSIS — T82898A Other specified complication of vascular prosthetic devices, implants and grafts, initial encounter: Secondary | ICD-10-CM

## 2012-08-30 DIAGNOSIS — I871 Compression of vein: Secondary | ICD-10-CM

## 2012-08-30 DIAGNOSIS — Z91158 Patient's noncompliance with renal dialysis for other reason: Secondary | ICD-10-CM

## 2012-08-30 DIAGNOSIS — R609 Edema, unspecified: Secondary | ICD-10-CM | POA: Diagnosis present

## 2012-08-30 DIAGNOSIS — A419 Sepsis, unspecified organism: Secondary | ICD-10-CM

## 2012-08-30 DIAGNOSIS — K219 Gastro-esophageal reflux disease without esophagitis: Secondary | ICD-10-CM | POA: Diagnosis present

## 2012-08-30 DIAGNOSIS — R1909 Other intra-abdominal and pelvic swelling, mass and lump: Secondary | ICD-10-CM | POA: Diagnosis present

## 2012-08-30 DIAGNOSIS — E1149 Type 2 diabetes mellitus with other diabetic neurological complication: Secondary | ICD-10-CM | POA: Diagnosis present

## 2012-08-30 DIAGNOSIS — R579 Shock, unspecified: Secondary | ICD-10-CM

## 2012-08-30 DIAGNOSIS — I739 Peripheral vascular disease, unspecified: Secondary | ICD-10-CM

## 2012-08-30 DIAGNOSIS — I6529 Occlusion and stenosis of unspecified carotid artery: Secondary | ICD-10-CM

## 2012-08-30 DIAGNOSIS — G5790 Unspecified mononeuropathy of unspecified lower limb: Secondary | ICD-10-CM

## 2012-08-30 DIAGNOSIS — E871 Hypo-osmolality and hyponatremia: Secondary | ICD-10-CM | POA: Diagnosis present

## 2012-08-30 DIAGNOSIS — IMO0002 Reserved for concepts with insufficient information to code with codable children: Secondary | ICD-10-CM | POA: Diagnosis present

## 2012-08-30 DIAGNOSIS — I2510 Atherosclerotic heart disease of native coronary artery without angina pectoris: Secondary | ICD-10-CM

## 2012-08-30 DIAGNOSIS — E11622 Type 2 diabetes mellitus with other skin ulcer: Secondary | ICD-10-CM

## 2012-08-30 DIAGNOSIS — K922 Gastrointestinal hemorrhage, unspecified: Secondary | ICD-10-CM

## 2012-08-30 DIAGNOSIS — R1310 Dysphagia, unspecified: Secondary | ICD-10-CM

## 2012-08-30 DIAGNOSIS — I469 Cardiac arrest, cause unspecified: Secondary | ICD-10-CM

## 2012-08-30 DIAGNOSIS — I70219 Atherosclerosis of native arteries of extremities with intermittent claudication, unspecified extremity: Secondary | ICD-10-CM | POA: Diagnosis present

## 2012-08-30 DIAGNOSIS — L03116 Cellulitis of left lower limb: Secondary | ICD-10-CM | POA: Diagnosis present

## 2012-08-30 DIAGNOSIS — I82409 Acute embolism and thrombosis of unspecified deep veins of unspecified lower extremity: Secondary | ICD-10-CM

## 2012-08-30 DIAGNOSIS — Z992 Dependence on renal dialysis: Secondary | ICD-10-CM

## 2012-08-30 DIAGNOSIS — Z794 Long term (current) use of insulin: Secondary | ICD-10-CM

## 2012-08-30 DIAGNOSIS — M79606 Pain in leg, unspecified: Secondary | ICD-10-CM

## 2012-08-30 DIAGNOSIS — Z720 Tobacco use: Secondary | ICD-10-CM | POA: Diagnosis present

## 2012-08-30 DIAGNOSIS — N390 Urinary tract infection, site not specified: Principal | ICD-10-CM | POA: Diagnosis present

## 2012-08-30 DIAGNOSIS — E1143 Type 2 diabetes mellitus with diabetic autonomic (poly)neuropathy: Secondary | ICD-10-CM | POA: Diagnosis present

## 2012-08-30 DIAGNOSIS — Z885 Allergy status to narcotic agent status: Secondary | ICD-10-CM

## 2012-08-30 DIAGNOSIS — Z881 Allergy status to other antibiotic agents status: Secondary | ICD-10-CM

## 2012-08-30 DIAGNOSIS — Z888 Allergy status to other drugs, medicaments and biological substances status: Secondary | ICD-10-CM

## 2012-08-30 DIAGNOSIS — E1142 Type 2 diabetes mellitus with diabetic polyneuropathy: Secondary | ICD-10-CM | POA: Diagnosis present

## 2012-08-30 DIAGNOSIS — L98499 Non-pressure chronic ulcer of skin of other sites with unspecified severity: Secondary | ICD-10-CM

## 2012-08-30 DIAGNOSIS — Z86711 Personal history of pulmonary embolism: Secondary | ICD-10-CM

## 2012-08-30 DIAGNOSIS — E118 Type 2 diabetes mellitus with unspecified complications: Secondary | ICD-10-CM | POA: Diagnosis present

## 2012-08-30 DIAGNOSIS — R1900 Intra-abdominal and pelvic swelling, mass and lump, unspecified site: Secondary | ICD-10-CM

## 2012-08-30 DIAGNOSIS — Z79899 Other long term (current) drug therapy: Secondary | ICD-10-CM

## 2012-08-30 DIAGNOSIS — R1084 Generalized abdominal pain: Secondary | ICD-10-CM | POA: Diagnosis present

## 2012-08-30 DIAGNOSIS — N039 Chronic nephritic syndrome with unspecified morphologic changes: Secondary | ICD-10-CM | POA: Diagnosis present

## 2012-08-30 DIAGNOSIS — Z8701 Personal history of pneumonia (recurrent): Secondary | ICD-10-CM

## 2012-08-30 DIAGNOSIS — F419 Anxiety disorder, unspecified: Secondary | ICD-10-CM

## 2012-08-30 DIAGNOSIS — E1165 Type 2 diabetes mellitus with hyperglycemia: Secondary | ICD-10-CM | POA: Diagnosis present

## 2012-08-30 DIAGNOSIS — D631 Anemia in chronic kidney disease: Secondary | ICD-10-CM | POA: Diagnosis present

## 2012-08-30 DIAGNOSIS — Z9115 Patient's noncompliance with renal dialysis: Secondary | ICD-10-CM

## 2012-08-30 DIAGNOSIS — K3184 Gastroparesis: Secondary | ICD-10-CM | POA: Diagnosis present

## 2012-08-30 DIAGNOSIS — R112 Nausea with vomiting, unspecified: Secondary | ICD-10-CM | POA: Diagnosis present

## 2012-08-30 LAB — CBC WITH DIFFERENTIAL/PLATELET
Basophils Absolute: 0.1 10*3/uL (ref 0.0–0.1)
Basophils Relative: 1 % (ref 0–1)
Eosinophils Absolute: 0.2 10*3/uL (ref 0.0–0.7)
HCT: 34.6 % — ABNORMAL LOW (ref 36.0–46.0)
MCH: 30.5 pg (ref 26.0–34.0)
MCHC: 31.2 g/dL (ref 30.0–36.0)
Monocytes Absolute: 0.7 10*3/uL (ref 0.1–1.0)
Monocytes Relative: 8 % (ref 3–12)
Neutro Abs: 7.8 10*3/uL — ABNORMAL HIGH (ref 1.7–7.7)
RDW: 18.9 % — ABNORMAL HIGH (ref 11.5–15.5)

## 2012-08-30 LAB — GLUCOSE, CAPILLARY: Glucose-Capillary: 162 mg/dL — ABNORMAL HIGH (ref 70–99)

## 2012-08-30 LAB — URINE MICROSCOPIC-ADD ON

## 2012-08-30 LAB — BASIC METABOLIC PANEL
BUN: 48 mg/dL — ABNORMAL HIGH (ref 6–23)
Calcium: 9 mg/dL (ref 8.4–10.5)
Chloride: 88 mEq/L — ABNORMAL LOW (ref 96–112)
Creatinine, Ser: 5.56 mg/dL — ABNORMAL HIGH (ref 0.50–1.10)
GFR calc Af Amer: 10 mL/min — ABNORMAL LOW (ref >90)
GFR calc non Af Amer: 9 mL/min — ABNORMAL LOW (ref >90)

## 2012-08-30 LAB — URINALYSIS, ROUTINE W REFLEX MICROSCOPIC
Ketones, ur: NEGATIVE mg/dL
Nitrite: POSITIVE — AB
Protein, ur: 100 mg/dL — AB
Urobilinogen, UA: 0.2 mg/dL (ref 0.0–1.0)
pH: 6.5 (ref 5.0–8.0)

## 2012-08-30 LAB — HEPATIC FUNCTION PANEL
ALT: 7 U/L (ref 0–35)
Albumin: 2.1 g/dL — ABNORMAL LOW (ref 3.5–5.2)
Alkaline Phosphatase: 216 U/L — ABNORMAL HIGH (ref 39–117)
Total Protein: 7 g/dL (ref 6.0–8.3)

## 2012-08-30 MED ORDER — ACETAMINOPHEN 325 MG PO TABS
650.0000 mg | ORAL_TABLET | Freq: Four times a day (QID) | ORAL | Status: DC | PRN
  Filled 2012-08-30: qty 2

## 2012-08-30 MED ORDER — PAROXETINE HCL 30 MG PO TABS
80.0000 mg | ORAL_TABLET | ORAL | Status: DC
  Filled 2012-08-30 (×2): qty 1

## 2012-08-30 MED ORDER — ALPRAZOLAM 1 MG PO TABS
1.0000 mg | ORAL_TABLET | Freq: Three times a day (TID) | ORAL | Status: DC | PRN
  Administered 2012-08-30 – 2012-08-31 (×2): 1 mg via ORAL
  Filled 2012-08-30 (×3): qty 1

## 2012-08-30 MED ORDER — ACETAMINOPHEN 650 MG RE SUPP
650.0000 mg | Freq: Four times a day (QID) | RECTAL | Status: DC | PRN
  Filled 2012-08-30: qty 1

## 2012-08-30 MED ORDER — ARIPIPRAZOLE 10 MG PO TABS
10.0000 mg | ORAL_TABLET | Freq: Every day | ORAL | Status: DC
  Filled 2012-08-30 (×2): qty 1

## 2012-08-30 MED ORDER — SEVELAMER CARBONATE 800 MG PO TABS
1600.0000 mg | ORAL_TABLET | Freq: Every day | ORAL | Status: DC
  Filled 2012-08-30 (×2): qty 2

## 2012-08-30 MED ORDER — CLOPIDOGREL BISULFATE 75 MG PO TABS
75.0000 mg | ORAL_TABLET | Freq: Every day | ORAL | Status: DC
  Administered 2012-08-31: 75 mg via ORAL
  Filled 2012-08-30 (×2): qty 1

## 2012-08-30 MED ORDER — FLUTICASONE PROPIONATE 50 MCG/ACT NA SUSP
1.0000 | Freq: Every day | NASAL | Status: DC
  Filled 2012-08-30: qty 16

## 2012-08-30 MED ORDER — VANCOMYCIN HCL IN DEXTROSE 1-5 GM/200ML-% IV SOLN
INTRAVENOUS | Status: AC
  Filled 2012-08-30: qty 200

## 2012-08-30 MED ORDER — ALUM & MAG HYDROXIDE-SIMETH 200-200-20 MG/5ML PO SUSP
30.0000 mL | Freq: Four times a day (QID) | ORAL | Status: DC | PRN
  Filled 2012-08-30: qty 30

## 2012-08-30 MED ORDER — ONDANSETRON HCL 4 MG PO TABS
4.0000 mg | ORAL_TABLET | Freq: Four times a day (QID) | ORAL | Status: DC | PRN
  Filled 2012-08-30: qty 1

## 2012-08-30 MED ORDER — DEXTROSE 5 % IV SOLN
1.0000 g | Freq: Once | INTRAVENOUS | Status: AC
  Administered 2012-08-30: 1 g via INTRAVENOUS
  Filled 2012-08-30: qty 10

## 2012-08-30 MED ORDER — ONDANSETRON HCL 4 MG/2ML IJ SOLN
4.0000 mg | Freq: Once | INTRAMUSCULAR | Status: AC
  Administered 2012-08-30: 4 mg via INTRAVENOUS
  Filled 2012-08-30: qty 2

## 2012-08-30 MED ORDER — DOCUSATE SODIUM 100 MG PO CAPS
100.0000 mg | ORAL_CAPSULE | Freq: Two times a day (BID) | ORAL | Status: DC
  Administered 2012-08-30: 100 mg via ORAL
  Filled 2012-08-30 (×2): qty 1

## 2012-08-30 MED ORDER — DEXTROSE 5 % IV SOLN
INTRAVENOUS | Status: AC
  Administered 2012-08-30: 15:00:00
  Filled 2012-08-30: qty 10

## 2012-08-30 MED ORDER — NICOTINE 21 MG/24HR TD PT24
21.0000 mg | MEDICATED_PATCH | Freq: Every day | TRANSDERMAL | Status: DC
  Administered 2012-08-30 – 2012-08-31 (×2): 21 mg via TRANSDERMAL
  Filled 2012-08-30 (×2): qty 1

## 2012-08-30 MED ORDER — ONDANSETRON HCL 4 MG/2ML IJ SOLN
4.0000 mg | Freq: Four times a day (QID) | INTRAMUSCULAR | Status: DC | PRN
  Filled 2012-08-30: qty 2

## 2012-08-30 MED ORDER — INSULIN ASPART 100 UNIT/ML ~~LOC~~ SOLN
0.0000 [IU] | Freq: Three times a day (TID) | SUBCUTANEOUS | Status: DC
  Administered 2012-08-30: 2 [IU] via SUBCUTANEOUS
  Administered 2012-08-31 (×2): 1 [IU] via SUBCUTANEOUS
  Filled 2012-08-30: qty 3

## 2012-08-30 MED ORDER — DEXTROSE 5 % IV SOLN
1.0000 g | INTRAVENOUS | Status: DC
  Filled 2012-08-30 (×2): qty 10

## 2012-08-30 MED ORDER — HEPARIN SODIUM (PORCINE) 5000 UNIT/ML IJ SOLN
5000.0000 [IU] | Freq: Three times a day (TID) | INTRAMUSCULAR | Status: DC
  Administered 2012-08-30 – 2012-08-31 (×3): 5000 [IU] via SUBCUTANEOUS
  Filled 2012-08-30 (×3): qty 1

## 2012-08-30 MED ORDER — PANTOPRAZOLE SODIUM 40 MG IV SOLR
40.0000 mg | INTRAVENOUS | Status: DC
  Administered 2012-08-30: 40 mg via INTRAVENOUS
  Filled 2012-08-30: qty 40

## 2012-08-30 MED ORDER — INSULIN GLARGINE 100 UNIT/ML ~~LOC~~ SOLN
15.0000 [IU] | Freq: Every day | SUBCUTANEOUS | Status: DC
  Administered 2012-08-30: 15 [IU] via SUBCUTANEOUS
  Filled 2012-08-30: qty 3

## 2012-08-30 MED ORDER — VANCOMYCIN HCL IN DEXTROSE 1-5 GM/200ML-% IV SOLN
1000.0000 mg | INTRAVENOUS | Status: AC
  Administered 2012-08-30: 1000 mg via INTRAVENOUS

## 2012-08-30 MED ORDER — CINACALCET HCL 30 MG PO TABS
30.0000 mg | ORAL_TABLET | Freq: Every day | ORAL | Status: DC
  Filled 2012-08-30 (×2): qty 1

## 2012-08-30 MED ORDER — NEPRO PO LIQD
237.0000 mL | Freq: Three times a day (TID) | ORAL | Status: DC
  Administered 2012-08-30: 237 mL via ORAL

## 2012-08-30 MED ORDER — ALBUTEROL SULFATE (5 MG/ML) 0.5% IN NEBU
2.5000 mg | INHALATION_SOLUTION | RESPIRATORY_TRACT | Status: DC | PRN
  Filled 2012-08-30: qty 0.5

## 2012-08-30 MED ORDER — HYDROMORPHONE HCL PF 1 MG/ML IJ SOLN
0.5000 mg | INTRAMUSCULAR | Status: DC | PRN
  Administered 2012-08-30 – 2012-08-31 (×5): 0.5 mg via INTRAVENOUS
  Filled 2012-08-30 (×6): qty 1

## 2012-08-30 MED ORDER — METOCLOPRAMIDE HCL 5 MG/ML IJ SOLN
5.0000 mg | Freq: Three times a day (TID) | INTRAMUSCULAR | Status: DC
  Administered 2012-08-30 – 2012-08-31 (×3): 5 mg via INTRAVENOUS
  Filled 2012-08-30 (×3): qty 2

## 2012-08-30 MED ORDER — SODIUM CHLORIDE 0.9 % IV SOLN
INTRAVENOUS | Status: DC
  Administered 2012-08-30: 17:00:00 via INTRAVENOUS

## 2012-08-30 NOTE — ED Notes (Signed)
Patient last dialyzed on Tuesday, but only stayed half of treatment time.  Did not dialyze the Sat. Prior.  Sister states patient never stays full time at dialysis and often skips treatments.

## 2012-08-30 NOTE — ED Provider Notes (Signed)
History  This chart was scribed for [name] Lennert, MD by Bennett Scrape, ED Scribe. This patient was seen in room APA06/APA06 and the patient's care was started at 11:59 AM.  CSN: [id number]  Arrival date & time 08/30/12  1100   First MD Initiated Contact with Patient 08/30/12 1159      Chief Complaint  Patient presents with  . Emesis  . Flank Pain    Patient is a 42 y.o. female presenting with vomiting. The history is provided by the patient and a relative. No language interpreter was used.  Emesis  This is a new problem. The current episode started more than 2 days ago. The problem occurs 2 to 4 times per day. The problem has not changed since onset.The emesis has an appearance of stomach contents. There has been no fever. Pertinent negatives include no abdominal pain, no cough, no diarrhea and no headaches.    [name] is a 42 y.o. female who is a Sat/ Tues/Thurs dialysis pt who presents to the Emergency Department complaining of one week of sudden onset, non-changing, intermittent non-bloody emesis that started during her dialysis appointment with associated 2 days of left flank pain and bilateral leg edema. She reports that she currently feels nauseated. Relatives state that the pt also missed her dialysis appointment Tuesday, only had a 2 hour treatment Thursday and missed her dialysis appointment today due to her symptoms. Pt denies fever, chills, abdominal pain, cough and HA as associated symptoms. She has a h/o DM, HLD, HTN, bipolar disorder and GERD. She is a current everyday smoker but denies alcohol use.  Past Medical History  Diagnosis Date  . DVT (deep venous thrombosis)     DVT HISTORY  . Diabetes mellitus   . Migraine     HISTORY  . CAD (coronary artery disease)     Last cath 2009:  LAD stent 80% stenosis. Circumflex 60-70% stenosis AV groove, right coronary artery 50-60% stenosis. She did have cutting balloon angioplasty of the LAD lesion into a diagonal.  However, there was restenosis of this and it was managed medically.  . Hyperlipidemia   . Hyperparathyroidism   . PE (pulmonary embolism)     HISTORY  . Dialysis patient   . Anemia   . Renal failure     dialysis [place] t,th sat  . Nephrotic syndrome   . Peripheral vascular disease   . MRSA infection   . Anxiety   . Gastroparesis   . Complication of anesthesia   . PONV (postoperative nausea and vomiting)     x3 days post anesth. on 12/19/2011  . Myocardial infarction     x3 , last one - 10 yrs. ago  . Hypertension     sees Dr. Antoine Poche - [place], last stress test 11/2011, see result in EPIC, saw Dr. Antoine Poche as well at that time    . Shortness of breath   . Recurrent upper respiratory infection (URI)     Bronchitis 11/2011 - saw Dr. Loney Hering, treating currently /w antibiotic   . GERD (gastroesophageal reflux disease)   . Pneumonia     [facility]- 2012  . Noncompliance of patient with renal dialysis   . Cellulitis Oct. 2013  . MRSA (methicillin resistant staph aureus) culture positive Oct 2013  . Anxiety and depression   . Bipolar 1 disorder   . Depression   . Sleep apnea   . Oxygen dependent     2 L continuous    Past Surgical  History  Procedure Date  . Incise and drain abcess     OF THIGHS FROM INSULIN INJECTIONS  . Heart stents   . Portacath placement 2003  . Cataract extraction w/phaco 11/09/2011    Procedure: CATARACT EXTRACTION PHACO AND INTRAOCULAR LENS PLACEMENT (IOC);  Surgeon: Edmon Crape, MD;  Location: [facility] OR;  Service: Ophthalmology;  Laterality: Right;  . Pars plana vitrectomy 11/09/2011    Procedure: PARS PLANA VITRECTOMY WITH 23 GAUGE;  Surgeon: Edmon Crape, MD;  Location: [facility] OR;  Service: Ophthalmology;  Laterality: Right;  Ahmed Valve; Scleral Reinforcement Graft  . Dg av dialysis  shunt access exist*l* or 12/13/11    left arm  . Dialysis cath inserted & portacath      dialysis catheter inserted & portacath d/c'd- 12/19/2011  . Back surgery X 4    x4 times   .  Cholecystectomy   . Eye surgery   . Cardiac catheterization   . Aorta - bilateral femoral artery bypass graft 12/28/2011    Procedure: AORTA BIFEMORAL BYPASS GRAFT;  Surgeon: Nada Libman, MD;  Location: [facility] OR;  Service: Vascular;  Laterality: N/A;  AortoBifemoral Bypass using hemasheild 14x7 graft  . Femoral-popliteal bypass graft 12/28/2011    Procedure: BYPASS GRAFT FEMORAL-POPLITEAL ARTERY;  Surgeon: Nada Libman, MD;  Location: [facility] OR;  Service: Vascular;  Laterality: Right;  right femoral popliteal bypass using 6x80 propaten graft  . Insertion of dialysis catheter 02/15/2012    Procedure: INSERTION OF DIALYSIS CATHETER;  Surgeon: Chuck Hint, MD;  Location: [facility] OR;  Service: Vascular;  Laterality: Right;  insertion of dialysis catheter right internal jugular vein,removal of left internal jugular vein dialysis catheter  . Insertion of dialysis catheter 02/21/2012    Procedure: INSERTION OF DIALYSIS CATHETER;  Surgeon: Chuck Hint, MD;  Location: [facility] OR;  Service: Cardiovascular;  Laterality: N/A;  Insertion new dialysis catheter; possible venoplasty of fibrin sheath of  Right IJ catheter  . Venogram 02/21/2012    Procedure: VENOGRAM;  Surgeon: Chuck Hint, MD;  Location: [facility] OR;  Service: Cardiovascular;  Laterality: N/A;  possible venoplasty  . Insertion of dialysis catheter 04/23/2012    Procedure: INSERTION OF DIALYSIS CATHETER;  Surgeon: Sherren Kerns, MD;  Location: [facility] OR;  Service: Vascular;  Laterality: Right;  using a 80fr. x 23 cannon catheter in Right Internal Jugular  . Esophagogastroduodenoscopy 06/21/2012    Procedure: ESOPHAGOGASTRODUODENOSCOPY (EGD);  Surgeon: Charna Elizabeth, MD;  Location: [facility] ENDOSCOPY;  Service: Endoscopy;  Laterality: N/A;  bedside  . Avgg removal 07/18/2012    Left arm  . Avgg removal 07/18/2012    Procedure: REMOVAL OF ARTERIOVENOUS GORETEX GRAFT (AVGG);  Surgeon: Chuck Hint, MD;  [facility];  Service: Vascular;   Laterality: Left;  . Patch angioplasty 07/18/2012    Procedure: PATCH ANGIOPLASTY;  Surgeon: Chuck Hint, MD;  Location: [facility] OR;  Service: Vascular;  Laterality: Left;  with 1cm x 6cm vascu guard brachial patch angioplasty    Family History  Problem Relation Age of Onset  . Transient ischemic attack Mother   . Hepatitis Sister   . Diabetes type II Other   . Anesthesia problems Neg Hx   . Hypotension Neg Hx   . Malignant hyperthermia Neg Hx   . Pseudochol deficiency Neg Hx     History  Substance Use Topics  . Smoking status: Current Every Day Smoker -- 3.0 packs/day for 20 years    Types: Cigarettes  . Smokeless  tobacco: Never Used     Comment: pt states she is not ready to quit   . Alcohol Use: No    No OB history provided.  Review of Systems  Constitutional: Negative for fatigue.  HENT: Negative for congestion, sinus pressure and ear discharge.   Eyes: Negative for discharge.  Respiratory: Negative for cough.   Cardiovascular: Positive for leg swelling.  Gastrointestinal: Positive for nausea and vomiting. Negative for abdominal pain and diarrhea.  Genitourinary: Positive for flank pain (left). Negative for frequency and hematuria.  Musculoskeletal: Negative for back pain.  Skin: Negative for rash.  Neurological: Negative for seizures and headaches.  Hematological: Negative.   Psychiatric/Behavioral: Negative for hallucinations.    Allergies  Compazine; Contrast media; Meperidine hcl; Metformin and related; Morphine; Nubain; Tobramycin; Topiramate; and Toradol  Home Medications   Current Outpatient Rx  Name  Route  Sig  Dispense  Refill  . ALBUTEROL SULFATE HFA 108 (90 BASE) MCG/ACT IN AERS   Inhalation   Inhale 2 puffs into the lungs every 6 (six) hours as needed. Shortness of breath         . ALPRAZOLAM 1 MG PO TABS   Oral   Take 1 tablet (1 mg total) by mouth 3 (three) times daily as needed. For anxiety   60 tablet   0   . ARIPIPRAZOLE 2 MG PO  TABS   Oral   Take 10 mg by mouth daily.          Marland Kitchen NEPHRO-VITE 0.8 MG PO TABS   Oral   Take 0.8 mg by mouth at bedtime.         Marland Kitchen BUTALBITAL-APAP-CAFFEINE 50-325-40 MG PO TABS   Oral   Take 1 tablet by mouth 3 (three) times daily as needed for headache. For headaches   45 tablet   0   . CEPHALEXIN 500 MG PO CAPS   Oral   Take 1 capsule (500 mg total) by mouth 4 (four) times daily.   40 capsule   0   . CINACALCET HCL 30 MG PO TABS   Oral   Take 30 mg by mouth daily with supper.          Marland Kitchen CLOPIDOGREL BISULFATE 75 MG PO TABS   Oral   Take 75 mg by mouth daily. Last dose 12/17/2011         . DEXLANSOPRAZOLE 60 MG PO CPDR   Oral   Take 1 capsule (60 mg total) by mouth daily.   90 capsule   3   . FLUTICASONE PROPIONATE 50 MCG/ACT NA SUSP   Nasal   Place 1 spray into the nose daily.         . FUROSEMIDE 40 MG PO TABS   Oral   Take 40 mg by mouth daily.          . INSULIN GLARGINE 100 UNIT/ML [place] SOLN   Subcutaneous   Inject 33 Units into the skin at bedtime.         . INSULIN LISPRO (HUMAN) 100 UNIT/ML [place] SOLN   Subcutaneous   Inject 7-15 Units into the skin 4 (four) times daily -  before meals and at bedtime. TAKING ON SLIDING SCALE 200-249=7 units 250-299=10 units 300-349=12 units >350=15 units         . METOCLOPRAMIDE HCL 10 MG PO TABS   Oral   Take 1 tablet (10 mg total) by mouth 4 (four) times daily.   120 tablet   2   .  NEPRO PO LIQD   Oral   Take 237 mLs by mouth 3 (three) times daily with meals as needed.      0   . OXYCODONE-ACETAMINOPHEN 10-325 MG PO TABS   Oral   Take 1 tablet by mouth every 4 (four) hours as needed. For pain   20 tablet   0   . PAROXETINE HCL 40 MG PO TABS   Oral   Take 80 mg by mouth every morning.          Marland Kitchen PROMETHAZINE HCL 25 MG PO TABS   Oral   Take 1 tablet (25 mg total) by mouth 2 (two) times daily as needed. For nausea   60 tablet   2   . PYRIDOXINE HCL 100 MG PO TABS   Oral   Take 100  mg by mouth daily.         Marland Kitchen SEVELAMER CARBONATE 800 MG PO TABS   Oral   Take 1,600-4,000 mg by mouth 5 (five) times daily. *Take 5 tablets daily with each meal and take 2 tablets with snacks**         . ULTICARE INSULIN SYRINGE 31G X 5/16" 1 ML MISC                 Triage Vitals: BP 145/69  Pulse 94  Temp 97.6 F (36.4 C) (Oral)  Resp 20  Ht 5\' 10"  (1.778 m)  Wt 195 lb (88.451 kg)  BMI 27.98 kg/m2  SpO2 96%  LMP 09/03/2008  Physical Exam  Nursing note and vitals reviewed. Constitutional: She is oriented to person, place, and time. She appears well-developed and well-nourished.  HENT:  Head: Normocephalic and atraumatic.  Eyes: Conjunctivae normal and EOM are normal. No scleral icterus.  Neck: Neck supple. No thyromegaly present.  Cardiovascular: Normal rate and regular rhythm.  Exam reveals no gallop and no friction rub.   No murmur heard. Pulmonary/Chest: Effort normal and breath sounds normal. No stridor. She has no wheezes. She has no rales. She exhibits no tenderness.  Abdominal: Soft. She exhibits no distension. There is no tenderness. There is no rebound.  Musculoskeletal: Normal range of motion. She exhibits edema.       Moderate left flank tenderness, 2+ edema in bilateral lower extremities   Lymphadenopathy:    She has no cervical adenopathy.  Neurological: She is alert and oriented to person, place, and time. Coordination normal.  Skin: Skin is warm and dry. No rash noted. No erythema.  Psychiatric: She has a normal mood and affect. Her behavior is normal.    ED Course  Procedures (including critical care time)  DIAGNOSTIC STUDIES: Oxygen Saturation is 96% on room air, adequate by my interpretation.    COORDINATION OF CARE: 12:11 PM- Discussed treatment plan which includes CBC panel, CT of abdomen and Zofran with pt at bedside and pt agreed to plan.  12:15 PM- Ordered a 4 mg Zofran injection  Labs Reviewed - No data to display No results  found.   No diagnosis found.    MDM     The chart was scribed for me under my direct supervision.  I personally performed the history, physical, and medical decision making and all procedures in the evaluation of this patient.[name] Lennert, MD 08/30/12 [phone]

## 2012-08-30 NOTE — ED Notes (Signed)
Vomiting x 2 days with left flank pain. Due for dialysis today but was unable to make it due to illness.

## 2012-08-30 NOTE — ED Notes (Signed)
Report called to Abby, RN on unit 300. 

## 2012-08-30 NOTE — ED Notes (Signed)
MD at bedside. 

## 2012-08-30 NOTE — Progress Notes (Signed)
ANTIBIOTIC CONSULT NOTE - INITIAL  Pharmacy Consult for Vancomycin Indication: cellulitis  Allergies  Allergen Reactions  . Compazine (Prochlorperazine) Swelling    Per echart records (takes promethazine at home)  . Contrast Media (Iodinated Diagnostic Agents) Swelling    Per echart records  . Meperidine Hcl Swelling  . Metformin And Related Other (See Comments)    'just not myself, was told not to take metformin'  . Morphine Swelling  . Nubain (Nalbuphine Hcl) Swelling  . Tobramycin Other (See Comments)    Unknown reaction  . Topiramate (Topamax) Swelling    Tongue swelling - per echart records  . Toradol (Ketorolac Tromethamine) Swelling    Patient Measurements: Height: 5\' 10"  (177.8 cm) Weight: 195 lb (88.451 kg) IBW/kg (Calculated) : 68.5   Vital Signs: Temp: 97.5 F (36.4 C) (01/04 1639) Temp src: Oral (01/04 1639) BP: 134/61 mmHg (01/04 1639) Pulse Rate: 103  (01/04 1639) Intake/Output from previous day:   Intake/Output from this shift:    Labs:  Basename 08/30/12 1213  WBC 9.6  HGB 10.8*  PLT 335  LABCREA --  CREATININE 5.56*   Estimated Creatinine Clearance: 15.9 ml/min (by C-G formula based on Cr of 5.56). No results found for this basename: VANCOTROUGH:2,VANCOPEAK:2,VANCORANDOM:2,GENTTROUGH:2,GENTPEAK:2,GENTRANDOM:2,TOBRATROUGH:2,TOBRAPEAK:2,TOBRARND:2,AMIKACINPEAK:2,AMIKACINTROU:2,AMIKACIN:2, in the last 72 hours   Microbiology: No results found for this or any previous visit (from the past 720 hour(s)).  Medical History: Past Medical History  Diagnosis Date  . DVT (deep venous thrombosis)     DVT HISTORY  . Diabetes mellitus   . Migraine     HISTORY  . CAD (coronary artery disease)     Last cath 2009:  LAD stent 80% stenosis. Circumflex 60-70% stenosis AV groove, right coronary artery 50-60% stenosis. She did have cutting balloon angioplasty of the LAD lesion into a diagonal. However, there was restenosis of this and it was managed medically.   . Hyperlipidemia   . Hyperparathyroidism   . PE (pulmonary embolism)     HISTORY  . Dialysis patient   . Anemia   . Renal failure     dialysis [place] t,th sat  . Nephrotic syndrome   . Peripheral vascular disease   . MRSA infection   . Anxiety   . Gastroparesis   . Complication of anesthesia   . PONV (postoperative nausea and vomiting)     x3 days post anesth. on 12/19/2011  . Myocardial infarction     x3 , last one - 10 yrs. ago  . Hypertension     sees Dr. Antoine Poche - [place], last stress test 11/2011, see result in EPIC, saw Dr. Antoine Poche as well at that time    . Shortness of breath   . Recurrent upper respiratory infection (URI)     Bronchitis 11/2011 - saw Dr. Loney Hering, treating currently /w antibiotic   . GERD (gastroesophageal reflux disease)   . Pneumonia     APH- 2012  . Noncompliance of patient with renal dialysis   . Cellulitis Oct. 2013  . MRSA (methicillin resistant staph aureus) culture positive Oct 2013  . Anxiety and depression   . Bipolar 1 disorder   . Depression   . Sleep apnea   . Oxygen dependent     2 L continuous    Medications:  Scheduled:    . ARIPiprazole  10 mg Oral Daily  . cinacalcet  30 mg Oral Q supper  . clopidogrel  75 mg Oral Daily  . docusate sodium  100 mg Oral BID  . fluticasone  1 spray Each Nare Daily  . heparin  5,000 Units Subcutaneous Q8H  . insulin aspart  0-9 Units Subcutaneous TID WC  . insulin glargine  15 Units Subcutaneous QHS  . metoCLOPramide (REGLAN) injection  5 mg Intravenous TID AC & HS  . Nepro  237 mL Oral TID  . nicotine  21 mg Transdermal Daily  . [COMPLETED] ondansetron  4 mg Intravenous Once  . pantoprazole (PROTONIX) IV  40 mg Intravenous Q24H  . PARoxetine  80 mg Oral [id number]  . sevelamer  1,600-4,000 mg Oral 5 X Daily   Assessment: 42yo female with ESRD requiring dialysis.  Presented to ED with n/v and bilateral leg edema.  Starting Vancomycin for cellulitis.  Goal of Therapy:  pre-dialysis vancomycin  level 10-20  Plan: Vancomycin 1gm IV today x 1 F/U plans for dialysis to schedule additional vancomycin doses.  Valrie Hart A 08/30/2012,4:42 PM

## 2012-08-30 NOTE — H&P (Signed)
[facility] History and Physical  [name] ZOX:[id number] DOB: 28-Dec-1970 DOA: 08/30/2012  Referring physician: Bethann Berkshire, M.D. PCP: Ernestine Conrad, MD  Specialists: Vascular surgeon Dr. Myra Gianotti; nephrologist Dr. Kristian Covey; gastroenterologist, Dr. Karilyn Cota  Chief Complaint: Abdominal pain, nausea, and vomiting.  HPI: [name] is a 42 y.o. female with a complex medical history including end-stage renal disease, vasculopathy, diabetes mellitus, and coronary artery disease, who presents to the hospital today with a chief complaint of abdominal pain, nausea, and vomiting. Her symptoms started approximately 3-4 days ago. They have been intermittent. She has had crampy abdominal pain of her lower abdomen that radiates to the left and to the right. It has been intermittent. She has had associated nausea and vomiting for couple days, but it has subsided. She was able to drink some liquids today; she kept  down. She was recently started on Keflex by the vascular surgeon nurse practitioner for a questionable cellulitis at the post excision site of the infected dialysis graft. She believes her symptoms may have started before then. She denies associated fever, chills, cough, chest pain, worsening shortness of breath, or diarrhea. She denies hematemesis or bright red blood in her emesis. She was actually at dialysis today, but could not complete the session due to to pain.  In the emergency department, she is afebrile and hemodynamically stable. Her lab data are significant for a hemoglobin of 10.8, sodium of 129, BUN of 48, creatinine of 5.56, and a urinalysis that reveals too numerous to count WBCs. CT of her abdomen and pelvis revealed no acute intra-abdominal or pelvic pathology to explain her abdominal pain, however, bilateral hypodense inguinal collections last masses measuring 3.9 cm bilaterally are present. She is being admitted for further evaluation and management.   Review of Systems:  As above in history present illness. Also, she has chronic swelling in her legs, chronic bilateral leg pain, numbness and tingling in her legs, chronic shortness of breath, acid reflux chronic nausea. Otherwise review of systems is negative.  Past Medical History  Diagnosis Date  . DVT (deep venous thrombosis)     DVT HISTORY  . Diabetes mellitus   . Migraine     HISTORY  . CAD (coronary artery disease)     Last cath 2009:  LAD stent 80% stenosis. Circumflex 60-70% stenosis AV groove, right coronary artery 50-60% stenosis. She did have cutting balloon angioplasty of the LAD lesion into a diagonal. However, there was restenosis of this and it was managed medically.  . Hyperlipidemia   . Hyperparathyroidism   . PE (pulmonary embolism)     HISTORY  . Dialysis patient   . Anemia   . Renal failure     dialysis [place] t,th sat  . Nephrotic syndrome   . Peripheral vascular disease   . MRSA infection   . Anxiety   . Gastroparesis   . Complication of anesthesia   . PONV (postoperative nausea and vomiting)     x3 days post anesth. on 12/19/2011  . Myocardial infarction     x3 , last one - 10 yrs. ago  . Hypertension     sees Dr. Antoine Poche - [place], last stress test 11/2011, see result in EPIC, saw Dr. Antoine Poche as well at that time    . Shortness of breath   . Recurrent upper respiratory infection (URI)     Bronchitis 11/2011 - saw Dr. Loney Hering, treating currently /w antibiotic   . GERD (gastroesophageal reflux disease)   . Pneumonia  [facility]- 2012  . Noncompliance of patient with renal dialysis   . Cellulitis Oct. 2013  . MRSA (methicillin resistant staph aureus) culture positive Oct 2013  . Anxiety and depression   . Bipolar 1 disorder   . Depression   . Sleep apnea   . Oxygen dependent     2 L continuous   Past Surgical History  Procedure Date  . Incise and drain abcess     OF THIGHS FROM INSULIN INJECTIONS  . Heart stents   . Portacath placement 2003  . Cataract extraction  w/phaco 11/09/2011    Procedure: CATARACT EXTRACTION PHACO AND INTRAOCULAR LENS PLACEMENT (IOC);  Surgeon: Edmon Crape, MD;  [facility] OR;  Service: Ophthalmology;  Laterality: Right;  . Pars plana vitrectomy 11/09/2011    Procedure: PARS PLANA VITRECTOMY WITH 23 GAUGE;  Surgeon: Edmon Crape, MD;  Location: [facility] OR;  Service: Ophthalmology;  Laterality: Right;  Ahmed Valve; Scleral Reinforcement Graft  . Dg av dialysis  shunt access exist*l* or 12/13/11    left arm  . Dialysis cath inserted & portacath      dialysis catheter inserted & portacath d/c'd- 12/19/2011  . Back surgery X 4    x4 times   . Cholecystectomy   . Eye surgery   . Cardiac catheterization   . Aorta - bilateral femoral artery bypass graft 12/28/2011    Procedure: AORTA BIFEMORAL BYPASS GRAFT;  Surgeon: Nada Libman, MD;  Location: [facility] OR;  Service: Vascular;  Laterality: N/A;  AortoBifemoral Bypass using hemasheild 14x7 graft  . Femoral-popliteal bypass graft 12/28/2011    Procedure: BYPASS GRAFT FEMORAL-POPLITEAL ARTERY;  Surgeon: Nada Libman, MD;  Location: [facility] OR;  Service: Vascular;  Laterality: Right;  right femoral popliteal bypass using 6x80 propaten graft  . Insertion of dialysis catheter 02/15/2012    Procedure: INSERTION OF DIALYSIS CATHETER;  Surgeon: Chuck Hint, MD;  Location: [facility] OR;  Service: Vascular;  Laterality: Right;  insertion of dialysis catheter right internal jugular vein,removal of left internal jugular vein dialysis catheter  . Insertion of dialysis catheter 02/21/2012    Procedure: INSERTION OF DIALYSIS CATHETER;  Surgeon: Chuck Hint, MD;  Location: [facility] OR;  Service: Cardiovascular;  Laterality: N/A;  Insertion new dialysis catheter; possible venoplasty of fibrin sheath of  Right IJ catheter  . Venogram 02/21/2012    Procedure: VENOGRAM;  Surgeon: Chuck Hint, MD;  Location: [facility] OR;  Service: Cardiovascular;  Laterality: N/A;  possible venoplasty  . Insertion of dialysis  catheter 04/23/2012    Procedure: INSERTION OF DIALYSIS CATHETER;  Surgeon: Sherren Kerns, MD;  Location: [facility] OR;  Service: Vascular;  Laterality: Right;  using a 26fr. x 23 cannon catheter in Right Internal Jugular  . Esophagogastroduodenoscopy 06/21/2012    Procedure: ESOPHAGOGASTRODUODENOSCOPY (EGD);  Surgeon: Charna Elizabeth, MD;  Location: [facility] ENDOSCOPY;  Service: Endoscopy;  Laterality: N/A;  bedside  . Avgg removal 07/18/2012    Left arm  . Avgg removal 07/18/2012    Procedure: REMOVAL OF ARTERIOVENOUS GORETEX GRAFT (AVGG);  Surgeon: Chuck Hint, MD;  Location: [facility] OR;  Service: Vascular;  Laterality: Left;  . Patch angioplasty 07/18/2012    Procedure: PATCH ANGIOPLASTY;  Surgeon: Chuck Hint, MD;  Location: [facility] OR;  Service: Vascular;  Laterality: Left;  with 1cm x 6cm vascu guard brachial patch angioplasty   Social History: She is divorced. She lives in [place] with her mother and her sister. She has no children. She is disabled.  She smokes 2 packs of cigarettes per day. She has a 60-pack-year history. She denies alcohol and illicit drug use.    Allergies  Allergen Reactions  . Compazine (Prochlorperazine) Swelling    Per echart records (takes promethazine at home)  . Contrast Media (Iodinated Diagnostic Agents) Swelling    Per echart records  . Meperidine Hcl Swelling  . Metformin And Related Other (See Comments)    'just not myself, was told not to take metformin'  . Morphine Swelling  . Nubain (Nalbuphine Hcl) Swelling  . Tobramycin Other (See Comments)    Unknown reaction  . Topiramate (Topamax) Swelling    Tongue swelling - per echart records  . Toradol (Ketorolac Tromethamine) Swelling    Family History  Problem Relation Age of Onset  . Transient ischemic attack Mother   . Hepatitis Sister   . Diabetes type II Other   . Anesthesia problems Neg Hx   . Hypotension Neg Hx   . Malignant hyperthermia Neg Hx   . Pseudochol deficiency Neg Hx     Prior to  Admission medications   Medication Sig Start Date End Date Taking? Authorizing Provider  albuterol (PROVENTIL HFA;VENTOLIN HFA) 108 (90 BASE) MCG/ACT inhaler Inhale 2 puffs into the lungs every 6 (six) hours as needed. Shortness of breath   Yes Historical Provider, MD  ALPRAZolam (XANAX) 1 MG tablet Take 1 tablet (1 mg total) by mouth 3 (three) times daily as needed. For anxiety 06/27/12  Yes Stephanie Coup Street, MD  b complex-vitamin c-folic acid (NEPHRO-VITE) 0.8 MG TABS Take 0.8 mg by mouth at bedtime.   Yes Historical Provider, MD  ARIPiprazole (ABILIFY) 2 MG tablet Take 10 mg by mouth daily.     Historical Provider, MD  butalbital-acetaminophen-caffeine (FIORICET, ESGIC) 50-325-40 MG per tablet Take 1 tablet by mouth 3 (three) times daily as needed for headache. For headaches 06/27/12   Stephanie Coup Street, MD  cephALEXin (KEFLEX) 500 MG capsule Take 1 capsule (500 mg total) by mouth 4 (four) times daily. 08/28/12   Evern Bio, NP  cinacalcet (SENSIPAR) 30 MG tablet Take 30 mg by mouth daily with supper.     Historical Provider, MD  clopidogrel (PLAVIX) 75 MG tablet Take 75 mg by mouth daily. Last dose 12/17/2011    Historical Provider, MD  dexlansoprazole (DEXILANT) 60 MG capsule Take 1 capsule (60 mg total) by mouth daily. 08/25/12   Malissa Hippo, MD  fluticasone (FLONASE) 50 MCG/ACT nasal spray Place 1 spray into the nose daily.    Historical Provider, MD  furosemide (LASIX) 40 MG tablet Take 40 mg by mouth daily.  04/07/12   Historical Provider, MD  insulin glargine (LANTUS) 100 UNIT/ML injection Inject 33 Units into the skin at bedtime. 05/05/12   Dorothea Ogle, MD  insulin lispro (HUMALOG) 100 UNIT/ML injection Inject 7-15 Units into the skin 4 (four) times daily -  before meals and at bedtime. TAKING ON SLIDING SCALE 200-249=7 units 250-299=10 units 300-349=12 units >350=15 units    Historical Provider, MD  metoCLOPramide (REGLAN) 10 MG tablet Take 1 tablet (10 mg total) by mouth 4  (four) times daily. 08/25/12   Malissa Hippo, MD  Nutritional Supplements (NEPRO) LIQD Take 237 mLs by mouth 3 (three) times daily with meals as needed. 06/27/12   Stephanie Coup Street, MD  oxyCODONE-acetaminophen (PERCOCET) 10-325 MG per tablet Take 1 tablet by mouth every 4 (four) hours as needed. For pain 07/18/12   [place], [place]  PARoxetine (PAXIL) 40 MG tablet Take 80 mg by mouth every morning.     Historical Provider, MD  promethazine (PHENERGAN) 25 MG tablet Take 1 tablet (25 mg total) by mouth 2 (two) times daily as needed. For nausea 08/25/12   Malissa Hippo, MD  pyridoxine (B-6) 100 MG tablet Take 100 mg by mouth daily.    Historical Provider, MD  sevelamer (RENVELA) 800 MG tablet Take 1,600-4,000 mg by mouth 5 (five) times daily. *Take 5 tablets daily with each meal and take 2 tablets with snacks**    Historical Provider, MD   Physical Exam: Filed Vitals:   08/30/12 1129 08/30/12 1221 08/30/12 1339  BP: 145/69 135/60 146/56  Pulse: 94 91 96  Temp: 97.6 F (36.4 C)    TempSrc: Oral    Resp: 20 20 20   Height: 5\' 10"  (1.778 m)    Weight: 88.451 kg (195 lb)    SpO2: 96% 92% 91%     General:  Obese semi-alert 42 year old Caucasian woman lying in bed, in no acute distress.  Eyes: Pupils are equal, round, and reactive to light. Extraocular was are intact. Conjunctivae are clear. Sclera white.  ENT: Oropharynx reveals mildly dry mucous membranes. No exudates or erythema.  Neck: Obese, supple, no adenopathy, no thyromegaly.  Cardiovascular: S1, S2, with a soft systolic murmur.  Respiratory: Rare crackles noted in the bases. Breathing is nonlabored.  Abdomen: Obese, multiple striate, mildly tender over the hypogastrium with no lateralization, no masses palpated, no distention. Query left flank tenderness.  Skin: Left arm dialysis site without erythema. Thrill palpated. Diffuse erythema over the left leg, below-the-knee. Scant erythema over the pretibial surface of the  right leg.   Musculoskeletal: Global 2+ eating edema of both legs. Pedal pulses not palpable by my exam, but feet are warm. No acute hot red joints. Postoperative scars palpated on the right leg.  Inguinal regions: Bilaterally, there is extensive scar tissue. Could not palpate beyond the scar tissue. No obvious nodule palpated. No thrill. Auscultated the area, but did not hear any abnormal sounds, but extensive scar tissue noted.   Psychiatric: She was initially semi-alert, but became alert and oriented during the exam. Speech is clear. Affect is flat.   Neurologic: Cranial nerves II through XII are intact. Sensation globally decreased of the lower extremities. She was able to raise each leg against gravity only 10-15. She had a 5 over 5 strength of bilateral hand grip.   Labs on Admission:  Basic Metabolic Panel:  Lab 08/30/12 4540  NA 129*  K 4.7  CL 88*  CO2 22  GLUCOSE 161*  BUN 48*  CREATININE 5.56*  CALCIUM 9.0  MG --  PHOS --   Liver Function Tests: No results found for this basename: AST:5,ALT:5,ALKPHOS:5,BILITOT:5,PROT:5,ALBUMIN:5 in the last 168 hours No results found for this basename: LIPASE:5,AMYLASE:5 in the last 168 hours No results found for this basename: AMMONIA:5 in the last 168 hours CBC:  Lab 08/30/12 1213  WBC 9.6  NEUTROABS 7.8*  HGB 10.8*  HCT 34.6*  MCV 97.7  PLT 335   Cardiac Enzymes: No results found for this basename: CKTOTAL:5,CKMB:5,CKMBINDEX:5,TROPONINI:5 in the last 168 hours  BNP (last 3 results)  Basename 06/17/12 1633 02/14/12 1447  PROBNP 16777.0* 13271.0*   CBG: No results found for this basename: GLUCAP:5 in the last 168 hours  Radiological Exams on Admission: Ct Abdomen Pelvis Wo Contrast  08/30/2012  *RADIOLOGY REPORT*  Clinical Data: Left-sided flank pain, patient on dialysis  CT ABDOMEN  AND PELVIS WITHOUT CONTRAST  Technique:  Multidetector CT imaging of the abdomen and pelvis was performed following the standard protocol  without intravenous contrast.  Comparison: 05/02/2012  Findings: Trace pleural effusions and associated minimal compressive atelectasis again noted.  Mild subcutaneous soft tissue edema.  Exuberant coarse calcification and stranding within the anterior abdominal wall subcutaneous tissues may indicate injection sites.  The hepatic dome incompletely imaged.  Posterior segment right hepatic lobe granuloma image 24 is imaged, unchanged. Cholecystectomy clips noted.  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  Bilateral renal cortical thinning is stable.  Spleen and pancreas are unremarkable.  Extensive vascular calcification.  Evidence of aortobi-iliac graft.  Bilateral groin collections/masses measuring just above fluid density are new since prior exam.  These measure 3.9 cm maximally on the right and 3.9 cm maximally on the left, images 79 and 77, respectively.  There is new minimal fullness of the bilateral intrarenal collecting systems with tapering to normal caliber proximal ureters.  This is of uncertain etiology assuming the patient is anuric.  No ascites or lymphadenopathy.  No bowel wall thickening.  Apparent hysterectomy.  Ovaries are normal.  Trace pelvic free fluid is identified.  Fem-pop bypass graft partly visualized.  No new osseous abnormality.  Lumbar fusion hardware noted.  IMPRESSION: Bilateral hypodense inguinal collections/masses as above, measuring just above the simple fluid.  These could represent seromas related to prior bypass grafting surgery or less likely lymphadenopathy. If there has been vascular access to these areas, pseudoaneurysm could have a similar appearance.  Ultrasound of the bilateral inguinal regions in the vascular lab is recommended for further evaluation.  This should be done with specific attention also to vascular structures to evaluate for possible pseudoaneurysm or less likely AV fistula.  No acute intra-abdominal or pelvic  pathology to account for the history of left-sided pain.  New minimal fullness of the bilateral intrarenal collecting systems without ureterectasis.  Etiology is uncertain.  Given the presence of prior retroperitoneal surgery, it is possible there is an occult element of fibrosis or scarring causing adhesion or secondary involvement of the ureters, but if the patient is anuric the significance is unknown.   Original Report Authenticated By: Christiana Pellant, M.D.    Dg Chest Port 1 View  08/30/2012  *RADIOLOGY REPORT*  Clinical Data: Shortness of breath  PORTABLE CHEST - 1 VIEW  Comparison: 08/12/2012  Findings: Dual lumen right hemodialysis catheter terminates over the right atrium.  The patient is rotated to the left.  Mild enlargement cardiomediastinal silhouette is noted.  Previously questioned left upper lobe nodule appears to correspond to the left anterior 3rd costochondral cartilage.  There is minimal ill-defined perihilar airspace opacity and trace bilateral pleural effusions.  IMPRESSION: Cardiomegaly with trace effusions and perihilar opacity could indicate early edema.   Original Report Authenticated By: Christiana Pellant, M.D.       Assessment/Plan Principal Problem:  *Abdominal pain, generalized Active Problems:  Nausea and vomiting  UTI (urinary tract infection)  ESRD on dialysis  DM (diabetes mellitus), type 2 with complications  Tobacco abuse  Bipolar 1 disorder  Atherosclerosis of native arteries of the extremities with intermittent claudication  Left leg cellulitis  Neuropathic pain of both legs  Inguinal mass  Hyponatremia Tobacco abuse Anemia of end-stage renal disease  1. This is a 42 year old woman with an extensive medical history who presents with abdominal pain, nausea, and vomiting. Her nausea and vomiting has subsided. On exam, she does not have a  surgical abdomen. The CT scan of her abdomen and pelvis reveals bilateral inguinal nodules or masses of unknown etiology.  She has had catheterizations cannulating both inguinal regions per her sister. She has a history of aortobifemoral bypass graft with right femoral-popliteal bypass in May 2013. These areas are not likely abscesses as she is afebrile and her white blood cell count is within normal limits. I discussed the findings with the radiologist Dr. Chilton Si. She does not believe these areas are abscesses, but she recommended an ultrasound to evaluate for pseudoaneurysms et Karie Soda. Her urinalysis is suggestive of an acute urinary tract infection, possibly pyelonephritis. Her GI symptomatology could be secondary to the urinary tract infection or acute on chronic diabetic gastroparesis. She is noted to have cellulitis of her left leg. She was started on Keflex a couple days ago by the vascular surgery nurse practitioner. for a possible infection of her left arm, but on exam, there appears to be no areas of cellulitis.      Plan. 1. The patient was given ceftriaxone for treatment of urinary tract infection. This will be continued. We'll add vancomycin empirically to treat the left leg cellulitis. 2. Start a full liquid diet only, then advance as tolerated. Will hold non-critical medications. 3. Will give Reglan and Protonix IV during the first 24 hours and then transitioned into by mouth as tolerated. 4. IV analgesics as needed. IV antiemetics as needed. 5. Tobacco cessation counseling. A nicotine patch will be placed. The patient was advised to stop smoking. 6. Dr. Linton Flemings has already been consulted. He will dialyze the patient tomorrow. 7. For further evaluation, we'll order an ultrasound of the bilateral lower extremities to assess the inguinal regions. We'll order lipase and hepatic function panel. Urine culture has are to been ordered.  Code Status: Full code  Family Communication: Discussed with the patient's sister and mother.  Disposition Plan: Plan discharge to home in the next 24-48 hours.   Time spent:  One hour and 20 minutes.   [facility] Pager [phone]   If 7PM-7AM, please contact night-coverage [email or web address] Password [email or web address] 08/30/2012, 4:30 PM

## 2012-08-31 ENCOUNTER — Encounter (HOSPITAL_COMMUNITY): Payer: Self-pay | Admitting: *Deleted

## 2012-08-31 ENCOUNTER — Inpatient Hospital Stay (HOSPITAL_COMMUNITY): Payer: Medicare Other

## 2012-08-31 DIAGNOSIS — N390 Urinary tract infection, site not specified: Secondary | ICD-10-CM

## 2012-08-31 DIAGNOSIS — L02419 Cutaneous abscess of limb, unspecified: Secondary | ICD-10-CM

## 2012-08-31 DIAGNOSIS — N186 End stage renal disease: Secondary | ICD-10-CM

## 2012-08-31 DIAGNOSIS — E871 Hypo-osmolality and hyponatremia: Secondary | ICD-10-CM

## 2012-08-31 LAB — COMPREHENSIVE METABOLIC PANEL
ALT: 6 U/L (ref 0–35)
AST: 8 U/L (ref 0–37)
Alkaline Phosphatase: 196 U/L — ABNORMAL HIGH (ref 39–117)
GFR calc Af Amer: 9 mL/min — ABNORMAL LOW (ref >90)
Glucose, Bld: 141 mg/dL — ABNORMAL HIGH (ref 70–99)
Potassium: 4.9 mEq/L (ref 3.5–5.1)
Sodium: 131 mEq/L — ABNORMAL LOW (ref 135–145)
Total Protein: 6.4 g/dL (ref 6.0–8.3)

## 2012-08-31 LAB — CBC
HCT: 32.9 % — ABNORMAL LOW (ref 36.0–46.0)
MCH: 30 pg (ref 26.0–34.0)
MCHC: 30.4 g/dL (ref 30.0–36.0)
MCV: 98.8 fL (ref 78.0–100.0)
RDW: 19 % — ABNORMAL HIGH (ref 11.5–15.5)

## 2012-08-31 LAB — URINE CULTURE
Colony Count: NO GROWTH
Culture: NO GROWTH

## 2012-08-31 LAB — GLUCOSE, CAPILLARY
Glucose-Capillary: 145 mg/dL — ABNORMAL HIGH (ref 70–99)
Glucose-Capillary: 136 mg/dL — ABNORMAL HIGH (ref 70–99)

## 2012-08-31 MED ORDER — VANCOMYCIN HCL 500 MG IV SOLR
500.0000 mg | Freq: Once | INTRAVENOUS | Status: AC
  Administered 2012-08-31: 500 mg via INTRAVENOUS
  Filled 2012-08-31: qty 500

## 2012-08-31 MED ORDER — CIPROFLOXACIN HCL 500 MG PO TABS: ORAL_TABLET | ORAL | Status: DC

## 2012-08-31 MED ORDER — PROMETHAZINE HCL 12.5 MG PO TABS: 12.5000 mg | ORAL_TABLET | Freq: Four times a day (QID) | ORAL | Status: DC | PRN

## 2012-08-31 MED ORDER — NEPRO/CARBSTEADY PO LIQD
237.0000 mL | ORAL | Status: DC | PRN
  Filled 2012-08-31: qty 237

## 2012-08-31 MED ORDER — PAROXETINE HCL 20 MG PO TABS
80.0000 mg | ORAL_TABLET | Freq: Every day | ORAL | Status: DC
  Filled 2012-08-31: qty 4

## 2012-08-31 MED ORDER — LIDOCAINE-PRILOCAINE 2.5-2.5 % EX CREA: 1.0000 "application " | TOPICAL_CREAM | CUTANEOUS | Status: DC | PRN

## 2012-08-31 MED ORDER — ALTEPLASE 2 MG IJ SOLR
2.0000 mg | Freq: Once | INTRAMUSCULAR | Status: DC | PRN
  Filled 2012-08-31: qty 2

## 2012-08-31 MED ORDER — PENTAFLUOROPROP-TETRAFLUOROETH EX AERO
1.0000 "application " | INHALATION_SPRAY | CUTANEOUS | Status: DC | PRN
  Filled 2012-08-31: qty 103.5

## 2012-08-31 MED ORDER — PROMETHAZINE HCL 12.5 MG PO TABS: 12.5000 mg | ORAL_TABLET | Freq: Four times a day (QID) | ORAL | Status: AC | PRN

## 2012-08-31 MED ORDER — HEPARIN SODIUM (PORCINE) 1000 UNIT/ML DIALYSIS
300.0000 [IU] | INTRAMUSCULAR | Status: DC | PRN
  Filled 2012-08-31: qty 1

## 2012-08-31 MED ORDER — SODIUM CHLORIDE 0.9 % IV SOLN: 100.0000 mL | INTRAVENOUS | Status: DC | PRN

## 2012-08-31 MED ORDER — HEPARIN SODIUM (PORCINE) 1000 UNIT/ML DIALYSIS
1000.0000 [IU] | INTRAMUSCULAR | Status: DC | PRN
  Filled 2012-08-31: qty 1

## 2012-08-31 MED ORDER — CIPROFLOXACIN HCL 500 MG PO TABS: ORAL_TABLET | ORAL | Status: AC

## 2012-08-31 MED ORDER — NEPRO/CARBSTEADY PO LIQD
237.0000 mL | Freq: Three times a day (TID) | ORAL | Status: DC
  Filled 2012-08-31: qty 237

## 2012-08-31 MED ORDER — LIDOCAINE HCL (PF) 1 % IJ SOLN
5.0000 mL | INTRAMUSCULAR | Status: DC | PRN
  Filled 2012-08-31: qty 5

## 2012-08-31 MED ORDER — HEPARIN SODIUM (PORCINE) 1000 UNIT/ML DIALYSIS
20.0000 [IU]/kg | INTRAMUSCULAR | Status: DC | PRN
  Filled 2012-08-31: qty 2

## 2012-08-31 NOTE — Progress Notes (Addendum)
TX end at 1712 ran 4 hour, weight at end dialysis 80.8kg, pre  weigth may not have been accurate, I re zeroed bed before final weight and removed absolutely everything from bed except what zeroed with. Per machine pulled 5400 net. Report to [facility].

## 2012-08-31 NOTE — Consult Note (Signed)
Reason for Consult: End-stage renal disease Referring Physician: Dr. Charisse Klinefelter is an 42 y.o. female.  HPI: She is a patient who has history of diabetes, diabetic gastropathy and non-compliant with medication, salt and fluid intake in dialysis was brought by her family's because of nausea and vomiting. Patient has multiple admission because of similar problems. Presently according to her mother patient was throwing up and unable to keep down any food hence he decided to bring her to the emergency room. As stated above patient had a non-compliant with her diet and also her dialysis. Her last full dialysis was on Tuesday however on Thursday she signed off after 2 hours of dialysis. Presently she is also some abdominal pain.  Past Medical History  Diagnosis Date  . DVT (deep venous thrombosis)     DVT HISTORY  . Diabetes mellitus   . Migraine     HISTORY  . CAD (coronary artery disease)     Last cath 2009:  LAD stent 80% stenosis. Circumflex 60-70% stenosis AV groove, right coronary artery 50-60% stenosis. She did have cutting balloon angioplasty of the LAD lesion into a diagonal. However, there was restenosis of this and it was managed medically.  . Hyperlipidemia   . Hyperparathyroidism   . PE (pulmonary embolism)     HISTORY  . Dialysis patient   . Anemia   . Renal failure     dialysis [place] t,th sat  . Nephrotic syndrome   . Peripheral vascular disease   . MRSA infection   . Anxiety   . Gastroparesis   . Complication of anesthesia   . PONV (postoperative nausea and vomiting)     x3 days post anesth. on 12/19/2011  . Myocardial infarction     x3 , last one - 10 yrs. ago  . Hypertension     sees Dr. Antoine Poche - [place], last stress test 11/2011, see result in EPIC, saw Dr. Antoine Poche as well at that time    . Shortness of breath   . Recurrent upper respiratory infection (URI)     Bronchitis 11/2011 - saw Dr. Loney Hering, treating currently /w antibiotic   . GERD  (gastroesophageal reflux disease)   . Pneumonia     [facility]- 2012  . Noncompliance of patient with renal dialysis   . Cellulitis Oct. 2013  . MRSA (methicillin resistant staph aureus) culture positive Oct 2013  . Anxiety and depression   . Bipolar 1 disorder   . Depression   . Sleep apnea   . Oxygen dependent     2 L continuous    Past Surgical History  Procedure Date  . Incise and drain abcess     OF THIGHS FROM INSULIN INJECTIONS  . Heart stents   . Portacath placement 2003  . Cataract extraction w/phaco 11/09/2011    Procedure: CATARACT EXTRACTION PHACO AND INTRAOCULAR LENS PLACEMENT (IOC);  Surgeon: Edmon Crape, MD;  Location: [facility] OR;  Service: Ophthalmology;  Laterality: Right;  . Pars plana vitrectomy 11/09/2011    Procedure: PARS PLANA VITRECTOMY WITH 23 GAUGE;  Surgeon: Edmon Crape, MD;  Location: [facility] OR;  Service: Ophthalmology;  Laterality: Right;  Ahmed Valve; Scleral Reinforcement Graft  . Dg av dialysis  shunt access exist*l* or 12/13/11    left arm  . Dialysis cath inserted & portacath      dialysis catheter inserted & portacath d/c'd- 12/19/2011  . Back surgery X 4    x4 times   . Cholecystectomy   .  Eye surgery   . Cardiac catheterization   . Aorta - bilateral femoral artery bypass graft 12/28/2011    Procedure: AORTA BIFEMORAL BYPASS GRAFT;  Surgeon: Nada Libman, MD;  Location: [facility] OR;  Service: Vascular;  Laterality: N/A;  AortoBifemoral Bypass using hemasheild 14x7 graft  . Femoral-popliteal bypass graft 12/28/2011    Procedure: BYPASS GRAFT FEMORAL-POPLITEAL ARTERY;  Surgeon: Nada Libman, MD;  Location: [facility] OR;  Service: Vascular;  Laterality: Right;  right femoral popliteal bypass using 6x80 propaten graft  . Insertion of dialysis catheter 02/15/2012    Procedure: INSERTION OF DIALYSIS CATHETER;  Surgeon: Chuck Hint, MD;  Location: [facility] OR;  Service: Vascular;  Laterality: Right;  insertion of dialysis catheter right internal jugular vein,removal of left  internal jugular vein dialysis catheter  . Insertion of dialysis catheter 02/21/2012    Procedure: INSERTION OF DIALYSIS CATHETER;  Surgeon: Chuck Hint, MD;  Location: [facility] OR;  Service: Cardiovascular;  Laterality: N/A;  Insertion new dialysis catheter; possible venoplasty of fibrin sheath of  Right IJ catheter  . Venogram 02/21/2012    Procedure: VENOGRAM;  Surgeon: Chuck Hint, MD;  Location: [facility] OR;  Service: Cardiovascular;  Laterality: N/A;  possible venoplasty  . Insertion of dialysis catheter 04/23/2012    Procedure: INSERTION OF DIALYSIS CATHETER;  Surgeon: Sherren Kerns, MD;  Location: [facility] OR;  Service: Vascular;  Laterality: Right;  using a 29fr. x 23 cannon catheter in Right Internal Jugular  . Esophagogastroduodenoscopy 06/21/2012    Procedure: ESOPHAGOGASTRODUODENOSCOPY (EGD);  Surgeon: Charna Elizabeth, MD;  Location: [facility] ENDOSCOPY;  Service: Endoscopy;  Laterality: N/A;  bedside  . Avgg removal 07/18/2012    Left arm  . Avgg removal 07/18/2012    Procedure: REMOVAL OF ARTERIOVENOUS GORETEX GRAFT (AVGG);  Surgeon: Chuck Hint, MD;  Location: [facility] OR;  Service: Vascular;  Laterality: Left;  . Patch angioplasty 07/18/2012    Procedure: PATCH ANGIOPLASTY;  Surgeon: Chuck Hint, MD;  Location: [facility] OR;  Service: Vascular;  Laterality: Left;  with 1cm x 6cm vascu guard brachial patch angioplasty    Family History  Problem Relation Age of Onset  . Transient ischemic attack Mother   . Hepatitis Sister   . Diabetes type II Other   . Anesthesia problems Neg Hx   . Hypotension Neg Hx   . Malignant hyperthermia Neg Hx   . Pseudochol deficiency Neg Hx     Social History:  reports that she has been smoking Cigarettes.  She has a 40 pack-year smoking history. She has never used smokeless tobacco. She reports that she does not drink alcohol or use illicit drugs.  Allergies:  Allergies  Allergen Reactions  . Compazine (Prochlorperazine) Swelling    Per  echart records (takes promethazine at home)  . Contrast Media (Iodinated Diagnostic Agents) Swelling    Per echart records  . Meperidine Hcl Swelling  . Metformin And Related Other (See Comments)    'just not myself, was told not to take metformin'  . Morphine Swelling  . Nubain (Nalbuphine Hcl) Swelling  . Tobramycin Other (See Comments)    Unknown reaction  . Topiramate (Topamax) Swelling    Tongue swelling - per echart records  . Toradol (Ketorolac Tromethamine) Swelling    Medications: I have reviewed the patient's current medications.  Results for orders placed during the hospital encounter of 08/30/12 (from the past 48 hour(s))  CBC WITH DIFFERENTIAL     Status: Abnormal   Collection Time  08/30/12 12:13 PM      Component Value Range Comment   WBC 9.6  4.0 - 10.5 K/uL    RBC 3.54 (*) 3.87 - 5.11 MIL/uL    Hemoglobin 10.8 (*) 12.0 - 15.0 g/dL    HCT 11.9 (*) 14.7 - 46.0 %    MCV 97.7  78.0 - 100.0 fL    MCH 30.5  26.0 - 34.0 pg    MCHC 31.2  30.0 - 36.0 g/dL    RDW 82.9 (*) 56.2 - 15.5 %    Platelets 335  150 - 400 K/uL    Neutrophils Relative 82 (*) 43 - 77 %    Neutro Abs 7.8 (*) 1.7 - 7.7 K/uL    Lymphocytes Relative 7 (*) 12 - 46 %    Lymphs Abs 0.7  0.7 - 4.0 K/uL    Monocytes Relative 8  3 - 12 %    Monocytes Absolute 0.7  0.1 - 1.0 K/uL    Eosinophils Relative 2  0 - 5 %    Eosinophils Absolute 0.2  0.0 - 0.7 K/uL    Basophils Relative 1  0 - 1 %    Basophils Absolute 0.1  0.0 - 0.1 K/uL   BASIC METABOLIC PANEL     Status: Abnormal   Collection Time   08/30/12 12:13 PM      Component Value Range Comment   Sodium 129 (*) 135 - 145 mEq/L    Potassium 4.7  3.5 - 5.1 mEq/L    Chloride 88 (*) 96 - 112 mEq/L    CO2 22  19 - 32 mEq/L    Glucose, Bld 161 (*) 70 - 99 mg/dL    BUN 48 (*) 6 - 23 mg/dL    Creatinine, Ser 1.30 (*) 0.50 - 1.10 mg/dL    Calcium 9.0  8.4 - 86.5 mg/dL    GFR calc non Af Amer 9 (*) >90 mL/min    GFR calc Af Amer 10 (*) >90 mL/min     URINALYSIS, ROUTINE W REFLEX MICROSCOPIC     Status: Abnormal   Collection Time   08/30/12 12:29 PM      Component Value Range Comment   Color, Urine YELLOW  YELLOW    APPearance TURBID (*) CLEAR    Specific Gravity, Urine 1.025  1.005 - 1.030    pH 6.5  5.0 - 8.0    Glucose, UA NEGATIVE  NEGATIVE mg/dL    Hgb urine dipstick LARGE (*) NEGATIVE    Bilirubin Urine SMALL (*) NEGATIVE    Ketones, ur NEGATIVE  NEGATIVE mg/dL    Protein, ur 784 (*) NEGATIVE mg/dL    Urobilinogen, UA 0.2  0.0 - 1.0 mg/dL    Nitrite POSITIVE (*) NEGATIVE    Leukocytes, UA SMALL (*) NEGATIVE   URINE MICROSCOPIC-ADD ON     Status: Abnormal   Collection Time   08/30/12 12:29 PM      Component Value Range Comment   WBC, UA TOO NUMEROUS TO COUNT  <3 WBC/hpf    RBC / HPF 21-50  <3 RBC/hpf    Bacteria, UA MANY (*) RARE   LIPASE, BLOOD     Status: Normal   Collection Time   08/30/12  4:19 PM      Component Value Range Comment   Lipase 11  11 - 59 U/L   HEPATIC FUNCTION PANEL     Status: Abnormal   Collection Time   08/30/12  4:19 PM  Component Value Range Comment   Total Protein 7.0  6.0 - 8.3 g/dL    Albumin 2.1 (*) 3.5 - 5.2 g/dL    AST 13  0 - 37 U/L    ALT 7  0 - 35 U/L    Alkaline Phosphatase 216 (*) 39 - 117 U/L    Total Bilirubin 0.1 (*) 0.3 - 1.2 mg/dL    Bilirubin, Direct 0.1  0.0 - 0.3 mg/dL    Indirect Bilirubin 0.0 (*) 0.3 - 0.9 mg/dL   GLUCOSE, CAPILLARY     Status: Abnormal   Collection Time   08/30/12  4:51 PM      Component Value Range Comment   Glucose-Capillary 162 (*) 70 - 99 mg/dL    Comment 1 Documented in Chart      Comment 2 Notify RN     GLUCOSE, CAPILLARY     Status: Abnormal   Collection Time   08/30/12  6:45 PM      Component Value Range Comment   Glucose-Capillary 189 (*) 70 - 99 mg/dL    Comment 1 Notify RN      Comment 2 Documented in Chart     GLUCOSE, CAPILLARY     Status: Abnormal   Collection Time   08/30/12  9:19 PM      Component Value Range Comment    Glucose-Capillary 155 (*) 70 - 99 mg/dL   MRSA PCR SCREENING     Status: Normal   Collection Time   08/31/12  1:00 AM      Component Value Range Comment   MRSA by PCR NEGATIVE  NEGATIVE   COMPREHENSIVE METABOLIC PANEL     Status: Abnormal   Collection Time   08/31/12  5:13 AM      Component Value Range Comment   Sodium 131 (*) 135 - 145 mEq/L    Potassium 4.9  3.5 - 5.1 mEq/L    Chloride 88 (*) 96 - 112 mEq/L    CO2 20  19 - 32 mEq/L    Glucose, Bld 141 (*) 70 - 99 mg/dL    BUN 52 (*) 6 - 23 mg/dL    Creatinine, Ser 1.47 (*) 0.50 - 1.10 mg/dL    Calcium 8.9  8.4 - 82.9 mg/dL    Total Protein 6.4  6.0 - 8.3 g/dL    Albumin 1.9 (*) 3.5 - 5.2 g/dL    AST 8  0 - 37 U/L    ALT 6  0 - 35 U/L    Alkaline Phosphatase 196 (*) 39 - 117 U/L    Total Bilirubin 0.1 (*) 0.3 - 1.2 mg/dL    GFR calc non Af Amer 7 (*) >90 mL/min    GFR calc Af Amer 9 (*) >90 mL/min   CBC     Status: Abnormal   Collection Time   08/31/12  5:13 AM      Component Value Range Comment   WBC 8.5  4.0 - 10.5 K/uL    RBC 3.33 (*) 3.87 - 5.11 MIL/uL    Hemoglobin 10.0 (*) 12.0 - 15.0 g/dL    HCT 56.2 (*) 13.0 - 46.0 %    MCV 98.8  78.0 - 100.0 fL    MCH 30.0  26.0 - 34.0 pg    MCHC 30.4  30.0 - 36.0 g/dL    RDW 86.5 (*) 78.4 - 15.5 %    Platelets 374  150 - 400 K/uL   GLUCOSE, CAPILLARY  Status: Abnormal   Collection Time   08/31/12  7:40 AM      Component Value Range Comment   Glucose-Capillary 145 (*) 70 - 99 mg/dL    Comment 1 Documented in Chart      Comment 2 Notify RN       Ct Abdomen Pelvis Wo Contrast  08/30/2012  *RADIOLOGY REPORT*  Clinical Data: Left-sided flank pain, patient on dialysis  CT ABDOMEN AND PELVIS WITHOUT CONTRAST  Technique:  Multidetector CT imaging of the abdomen and pelvis was performed following the standard protocol without intravenous contrast.  Comparison: 05/02/2012  Findings: Trace pleural effusions and associated minimal compressive atelectasis again noted.  Mild subcutaneous soft  tissue edema.  Exuberant coarse calcification and stranding within the anterior abdominal wall subcutaneous tissues may indicate injection sites.  The hepatic dome incompletely imaged.  Posterior segment right hepatic lobe granuloma image 24 is imaged, unchanged. Cholecystectomy clips noted.  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  Bilateral renal cortical thinning is stable.  Spleen and pancreas are unremarkable.  Extensive vascular calcification.  Evidence of aortobi-iliac graft.  Bilateral groin collections/masses measuring just above fluid density are new since prior exam.  These measure 3.9 cm maximally on the right and 3.9 cm maximally on the left, images 79 and 77, respectively.  There is new minimal fullness of the bilateral intrarenal collecting systems with tapering to normal caliber proximal ureters.  This is of uncertain etiology assuming the patient is anuric.  No ascites or lymphadenopathy.  No bowel wall thickening.  Apparent hysterectomy.  Ovaries are normal.  Trace pelvic free fluid is identified.  Fem-pop bypass graft partly visualized.  No new osseous abnormality.  Lumbar fusion hardware noted.  IMPRESSION: Bilateral hypodense inguinal collections/masses as above, measuring just above the simple fluid.  These could represent seromas related to prior bypass grafting surgery or less likely lymphadenopathy. If there has been vascular access to these areas, pseudoaneurysm could have a similar appearance.  Ultrasound of the bilateral inguinal regions in the vascular lab is recommended for further evaluation.  This should be done with specific attention also to vascular structures to evaluate for possible pseudoaneurysm or less likely AV fistula.  No acute intra-abdominal or pelvic pathology to account for the history of left-sided pain.  New minimal fullness of the bilateral intrarenal collecting systems without ureterectasis.  Etiology is uncertain.   Given the presence of prior retroperitoneal surgery, it is possible there is an occult element of fibrosis or scarring causing adhesion or secondary involvement of the ureters, but if the patient is anuric the significance is unknown.   Original Report Authenticated By: Christiana Pellant, M.D.    Dg Chest Port 1 View  08/30/2012  *RADIOLOGY REPORT*  Clinical Data: Shortness of breath  PORTABLE CHEST - 1 VIEW  Comparison: 08/12/2012  Findings: Dual lumen right hemodialysis catheter terminates over the right atrium.  The patient is rotated to the left.  Mild enlargement cardiomediastinal silhouette is noted.  Previously questioned left upper lobe nodule appears to correspond to the left anterior 3rd costochondral cartilage.  There is minimal ill-defined perihilar airspace opacity and trace bilateral pleural effusions.  IMPRESSION: Cardiomegaly with trace effusions and perihilar opacity could indicate early edema.   Original Report Authenticated By: Christiana Pellant, M.D.     Review of Systems  Constitutional: Positive for malaise/fatigue.  Respiratory: Negative for sputum production and shortness of breath.   Cardiovascular: Positive for leg swelling. Negative for orthopnea and claudication.  Gastrointestinal: Positive for nausea, vomiting, abdominal pain and diarrhea.  Neurological: Positive for weakness.   Blood pressure 148/72, pulse 97, temperature 98.1 F (36.7 C), temperature source Oral, resp. rate 20, height 5' 10.5" (1.791 m), weight 88.4 kg (194 lb 14.2 oz), last menstrual period 09/03/2008, SpO2 92.00%. Physical Exam  Constitutional: No distress.  Eyes: No scleral icterus.  Neck: JVD present.  Cardiovascular: Normal rate and regular rhythm.   Murmur heard. Respiratory: No respiratory distress. She has wheezes. She has no rales.  GI: She exhibits no distension. There is no tenderness. There is no rebound.  Musculoskeletal: She exhibits edema.  Neurological:       Patient somnolent but  arousable. She can't sustain continuous discussion. Source of information was received from her mother and aunt.    Assessment/Plan: Problem #1 nausea vomiting most likely secondary to diabetic gastropathy. Patient noncompliant with her medications and most of the time doesn't take her medication except possibly pain medication. Presently she is feeling better. Problem #2 anasarca from non-compliance with her dialysis and salt and fluid intake. Patient occasionally comes with 20-40 pounds above her dry weight. Problem #3 diabetes uncontrolled Problem #4 history of bipolar disorder Problem #5 generalized atherosclerotic disease she is bypass to the right leg. Problem #6 anemia her hemoglobin and hematocrit is stable she is on Epogen. Problem #7 metabolic bone disease her calcium was in acceptable range however phosphorus level is not available most of the time it was high because she refuses to take her binders given when she is in the hospital. Problem #8 history of diabetic neuropathy Problem #9 obesity Problem #10 possible cellulitis of left leg. Presently patient is on Keflex by vascular surgery 3 days ago. Plan: We'll make arrangements for patient to get dialysis today I have discussed with her mother and spent about half an hour explaining to them that they need to help her with her diet, medication and be compliant with dialysis. Her mother told me should come and stay in dialysis unit to keep her complete her dialysis. We'll check her basic metabolic panel, phosphorus in the morning.  Thedford Bunton S 08/31/2012, 8:51 AM

## 2012-08-31 NOTE — Discharge Summary (Signed)
Physician Discharge Summary  [name] RUE:[id number] DOB: 01/11/1971 DOA: 08/30/2012  PCP: Ernestine Conrad, MD  Admit date: 08/30/2012 Discharge date: 08/31/2012  Time spent: Greater than 30 minutes  Recommendations for Outpatient Follow-up:  1. The patient was advised to followup with her primary care physician in one week. 2. She was instructed to followup at hemodialysis as scheduled every Tuesday, Thursday, and Saturdays.  Discharge Diagnoses:  1. Generalized abdominal pain, secondary to urinary tract infection and possibly diabetic gastroparesis. 2. Nausea and vomiting, resolved. 3. Urinary tract infection. 4. Left leg erythema, thought to be secondary to cellulitis, but according to her primary nephrologist, her left leg is chronically erythematous. 5. End-stage renal disease on hemodialysis. 6. Type 2 diabetes mellitus. 7. Atherosclerosis of her lower extremity/PAD and coronary artery disease.. 8. Tobacco abuse. The patient was advised to stop smoking. 9. Bipolar disorder. Stable. 10. Hyponatremia secondary to end-stage renal disease. 11. Bilateral inguinal masses per CT, confirmed to be normal-appearing lymph nodes per ultrasound. 12. Anemia of chronic kidney disease. 13. History of noncompliance.  Discharge Condition: Improved.  Diet recommendation: Renal and diabetic/carbohydrate modified.  Filed Weights   08/30/12 1129 08/30/12 1639 08/31/12 0532  Weight: 88.451 kg (195 lb) 85.9 kg (189 lb 6 oz) 88.4 kg (194 lb 14.2 oz)    History of present illness:  The patient is a 42 year old woman with a complex medical history, who presented to the emergency department on 08/30/2012 with a chief complaint of abdominal pain, nausea, and vomiting. She was actually at hemodialysis. She stopped the session early due to nausea, vomiting, and abdominal pain. She was advised to come to the emergency department at [facility] by nephrologist, Dr. Kristian Covey. In the emergency department,  the patient was noted to be afebrile and hemodynamically stable. Her lab data were significant for hemoglobin of 10.8, sodium of 129, BUN of 48, creatinine of 5.56, and a urinalysis that revealed too numerous to count WBCs and bacteria. CT of her abdomen and pelvis revealed no acute fracture abdominal or pelvic pathology to explain her abdominal pain, however, bilateral hypodense inguinal collections/masses measuring 3.9 cm bilaterally were present. She was admitted for further evaluation and management.  Hospital Course:  The patient was given ceftriaxone for treatment of the urinary tract infection in the emergency department. Upon examination, she was noted to have a mildly erythematous left leg, and therefore, she was empirically started on vancomycin for cellulitis. She was maintained on Reglan, but it was given scheduled intravenously. She was maintained on proton pump inhibitor therapy, but it was given intravenously. Many of her noncritical medications were withheld during the first 24 hours. Her diabetes was treated with sliding scale NovoLog and Lantus. She was started on a full liquid diet which was not advanced until it was confirmed that she was able to keep it down. The patient admitted that her nausea and vomiting has subsided prior to her presentation to the emergency department. A nicotine patch was placed. She was strongly advised to stop smoking given her history of coronary artery disease and peripheral vascular disease. Dr. Kristian Covey was consulted. He examined the patient and decided to dialyze her the following day.   For further evaluation of the inguinal masses, and ultrasound of her inguinal region was ordered following my discussion with the radiologist who reviewed the CT scan findings with me. The ultrasound revealed normal-appearing bilateral inguinal lymph nodes. Additional laboratory results were significant for a normal lipase and normal liver transaminases.  The patient  improved clinically and symptomatically. She had no complaints of vomiting or abdominal pain at the time of discharge. She still had some residual nausea which was likely chronic from her diabetic gastroparesis. It was decided that she would be discharged on Cipro for treatment of the urinary tract infection instead of a cephalosporin  for the simplicity of administration given her history of noncompliance. Prior to this hospitalization, she had been started on Keflex at 500 mg 4 times a day by the vascular surgery nurse practitioner for left upper extremity cellulitis. However upon my examination, there appeared to be no evidence of an left upper extremity infection. The erythema on her left leg is chronic according to Dr. Kristian Covey, however, the erythema subsided at the time of discharge following at least one dose of vancomycin. The Cipro should cover both the cellulitis and the urinary tract infection. She was advised to discontinue the Keflex. Of note, a urine culture was ordered, but the result was pending at the time of discharge.  Procedures: Hemodialysis Consultations:  Nephrologist, Dr. Kristian Covey  Discharge Exam: Filed Vitals:   08/31/12 1312 08/31/12 1330 08/31/12 1345 08/31/12 1408  BP: 171/81 163/88 152/89 149/79  Pulse: 97 99 98 100  Temp:      TempSrc:      Resp: 16 17 18 20   Height:      Weight:      SpO2: 92% 92% 92% 92%    General: Obese 42 year old Caucasian woman lying in bed, in no acute distress. Cardiovascular: S1, S2, with a 1-2/6 systolic murmur. Respiratory: Clear to auscultation bilaterally with decreased breath sounds in the bases. Abdomen: Morbidly obese, positive bowel sounds, soft, nontender, nondistended. Extremities: Trace to 1+ global bilateral pitting edema of both legs, decreased from yesterday. Mild erythema on the left lower extremity, substantially less than yesterday.  Discharge Instructions  Discharge Orders    Future Appointments: Provider:  Department: Dept Phone: Center:   09/03/2012 9:00 AM Evern Bio, NP [facility] [phone] [facility]   02/02/2013 10:30 AM [facility]Lab Lab 4 [facility] [phone] [facility]   02/02/2013 11:00 AM [facility]Lab Lab 4 [facility] [phone] [facility]   02/02/2013 12:00 PM Evern Bio, NP [facility] [phone] [facility]     Future Orders Please Complete By Expires   Diet - low sodium heart healthy      Diet Carb Modified      Increase activity slowly      Discharge instructions      Comments:   Stop smoking. Take antibiotic as directed.       Medication List     As of 08/31/2012  2:25 PM    STOP taking these medications         cephALEXin 500 MG capsule   Commonly known as: KEFLEX      ULTICARE INSULIN SYRINGE 31G X 5/16" 1 ML Misc   Generic drug: Insulin Syringe-Needle U-100      TAKE these medications         albuterol 108 (90 BASE) MCG/ACT inhaler   Commonly known as: PROVENTIL HFA;VENTOLIN HFA   Inhale 2 puffs into the lungs every 6 (six) hours as needed. Shortness of breath      ALPRAZolam 1 MG tablet   Commonly known as: XANAX   Take 1 tablet (1 mg total) by mouth 3 (three) times daily as needed. For anxiety      ARIPiprazole 2 MG tablet  Commonly known as: ABILIFY   Take 10 mg by mouth daily.      b complex-vitamin c-folic acid 0.8 MG Tabs   Take 0.8 mg by mouth at bedtime.      butalbital-acetaminophen-caffeine 50-325-40 MG per tablet   Commonly known as: FIORICET, ESGIC   Take 1 tablet by mouth 3 (three) times daily as needed for headache. For headaches      cinacalcet 30 MG tablet   Commonly known as: SENSIPAR   Take 30 mg by mouth daily with supper.      ciprofloxacin 500 MG tablet   Commonly known as: CIPRO   Antibiotic. Take one tablet on Tuesday, Thursday, and Saturday after each hemodialysis session. For infection.      clopidogrel 75 MG tablet    Commonly known as: PLAVIX   Take 75 mg by mouth daily. Last dose 12/17/2011      dexlansoprazole 60 MG capsule   Commonly known as: DEXILANT   Take 1 capsule (60 mg total) by mouth daily.      fluticasone 50 MCG/ACT nasal spray   Commonly known as: FLONASE   Place 1 spray into the nose daily.      furosemide 40 MG tablet   Commonly known as: LASIX   Take 40 mg by mouth daily.      insulin glargine 100 UNIT/ML injection   Commonly known as: LANTUS   Inject 33 Units into the skin at bedtime.      insulin lispro 100 UNIT/ML injection   Commonly known as: HUMALOG   Inject 7-15 Units into the skin 4 (four) times daily -  before meals and at bedtime. TAKING ON SLIDING SCALE  200-249=7 units  250-299=10 units  300-349=12 units  >350=15 units      metoCLOPramide 10 MG tablet   Commonly known as: REGLAN   Take 1 tablet (10 mg total) by mouth 4 (four) times daily.      Nepro Liqd   Take 237 mLs by mouth 3 (three) times daily with meals as needed.      oxyCODONE-acetaminophen 10-325 MG per tablet   Commonly known as: PERCOCET   Take 1 tablet by mouth every 4 (four) hours as needed. For pain      PARoxetine 40 MG tablet   Commonly known as: PAXIL   Take 80 mg by mouth every morning.      promethazine 12.5 MG tablet   Commonly known as: PHENERGAN   Take 1 tablet (12.5 mg total) by mouth every 6 (six) hours as needed. For nausea      pyridoxine 100 MG tablet   Commonly known as: B-6   Take 100 mg by mouth daily.      sevelamer 800 MG tablet   Commonly known as: RENVELA   Take 1,600-4,000 mg by mouth 5 (five) times daily. *Take 5 tablets daily with each meal and take 2 tablets with snacks**          The results of significant diagnostics from this hospitalization (including imaging, microbiology, ancillary and laboratory) are listed below for reference.    Significant Diagnostic Studies: Ct Abdomen Pelvis Wo Contrast  08/30/2012  *RADIOLOGY REPORT*  Clinical Data:  Left-sided flank pain, patient on dialysis  CT ABDOMEN AND PELVIS WITHOUT CONTRAST  Technique:  Multidetector CT imaging of the abdomen and pelvis was performed following the standard protocol without intravenous contrast.  Comparison: 05/02/2012  Findings: Trace pleural effusions and associated minimal compressive atelectasis again noted.  Mild subcutaneous soft tissue edema.  Exuberant coarse calcification and stranding within the anterior abdominal wall subcutaneous tissues may indicate injection sites.  The hepatic dome incompletely imaged.  Posterior segment right hepatic lobe granuloma image 24 is imaged, unchanged. Cholecystectomy clips noted.  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  Bilateral renal cortical thinning is stable.  Spleen and pancreas are unremarkable.  Extensive vascular calcification.  Evidence of aortobi-iliac graft.  Bilateral groin collections/masses measuring just above fluid density are new since prior exam.  These measure 3.9 cm maximally on the right and 3.9 cm maximally on the left, images 79 and 77, respectively.  There is new minimal fullness of the bilateral intrarenal collecting systems with tapering to normal caliber proximal ureters.  This is of uncertain etiology assuming the patient is anuric.  No ascites or lymphadenopathy.  No bowel wall thickening.  Apparent hysterectomy.  Ovaries are normal.  Trace pelvic free fluid is identified.  Fem-pop bypass graft partly visualized.  No new osseous abnormality.  Lumbar fusion hardware noted.  IMPRESSION: Bilateral hypodense inguinal collections/masses as above, measuring just above the simple fluid.  These could represent seromas related to prior bypass grafting surgery or less likely lymphadenopathy. If there has been vascular access to these areas, pseudoaneurysm could have a similar appearance.  Ultrasound of the bilateral inguinal regions in the vascular lab is recommended for  further evaluation.  This should be done with specific attention also to vascular structures to evaluate for possible pseudoaneurysm or less likely AV fistula.  No acute intra-abdominal or pelvic pathology to account for the history of left-sided pain.  New minimal fullness of the bilateral intrarenal collecting systems without ureterectasis.  Etiology is uncertain.  Given the presence of prior retroperitoneal surgery, it is possible there is an occult element of fibrosis or scarring causing adhesion or secondary involvement of the ureters, but if the patient is anuric the significance is unknown.   Original Report Authenticated By: Christiana Pellant, M.D.    [place] Extrem Low Bilat Comp  08/31/2012  *RADIOLOGY REPORT*  Clinical Data:  Bilateral inguinal masses on prior exam, history of fem-pop bypass grafting and left lower extremity swelling with erythema  BILATERAL LOWER EXTREMITY LIMITED SOFT TISSUE ULTRASOUND  Technique:  Ultrasound examination of the lower extremity soft tissues was performed in the area of clinical concern.  Comparison:  CT 08/30/2012  Findings:  Bilateral normal-appearing inguinal lymph nodes correspond to the questioned abnormalities in both groin regions. Bilateral fem-pop bypass grafts were identified.  Lymph nodes are normal in morphology and size.  IMPRESSION: Normal-appearing bilateral inguinal lymph nodes correspond to the questioned masses in the groin regions bilaterally on the prior exam.   Original Report Authenticated By: Christiana Pellant, M.D.    Ir Fluoro Guide Cv Line Right  08/15/2012  *RADIOLOGY REPORT*  Clinical history: 41 year old with end-stage renal disease. Dialysis catheter is not functioning.  PROCEDURE(S): EXCHANGE OF TUNNELED DIALYSIS CATHETER WITH FLUOROSCOPY  Physician: Rachelle Hora. Lowella Dandy, MD  Medications:Ancef 1 gm.  As antibiotic prophylaxis, Ancef  was ordered pre-procedure and administered intravenously within one hour of incision.  Moderate sedation time:None   Fluoroscopy time: 1.3 minutes  Procedure:The procedure was explained to the patient.  The risks and benefits of the procedure were discussed and the patient's questions were addressed.  Informed consent was obtained from the patient.  The right chest dialysis catheter and surrounding skin was prepped and draped in a sterile fashion.  Maximal barrier sterile  technique was utilized including caps, mask, sterile gowns, sterile gloves, sterile drape, hand hygiene and skin antiseptic. Heparin was removed from the red lumen. Unable to aspirate blood or heparin from the blue lumen.  The catheter sutures removed.  The catheter cuff was easily exposed.  A stiff Glidewire was advanced into the upper IVC.  Catheter removed over the wire.  New 23 cm Equistream catheter was advanced over the wire.  The catheter was placed in the right atrium.  Both lumens aspirated and flushed well.  Appropriate amount of heparin was placed in both lumens. The catheter was sutured to the skin.Fluoroscopic images were taken and saved for this procedure.  Findings:New catheter tip in the right atrium.  Complications: None  Impression:Successful exchange of the right chest tunneled dialysis catheter with fluoroscopy.   Original Report Authenticated By: Richarda Overlie, M.D.    Dg Chest Port 1 View  08/30/2012  *RADIOLOGY REPORT*  Clinical Data: Shortness of breath  PORTABLE CHEST - 1 VIEW  Comparison: 08/12/2012  Findings: Dual lumen right hemodialysis catheter terminates over the right atrium.  The patient is rotated to the left.  Mild enlargement cardiomediastinal silhouette is noted.  Previously questioned left upper lobe nodule appears to correspond to the left anterior 3rd costochondral cartilage.  There is minimal ill-defined perihilar airspace opacity and trace bilateral pleural effusions.  IMPRESSION: Cardiomegaly with trace effusions and perihilar opacity could indicate early edema.   Original Report Authenticated By: Christiana Pellant, M.D.     Dg Abd Acute W/chest  08/12/2012  *RADIOLOGY REPORT*  Clinical Data: Right lower quadrant pain, nausea/vomiting  ACUTE ABDOMEN SERIES (ABDOMEN 2 VIEW & CHEST 1 VIEW)  Comparison: Chest radiographs dated 07/18/2012  Findings: Possible 2.2 cm left upper lobe lung nodule, although this overlies the left anterior 3rd rib, and may be osseous in etiology.  Similar (but less conspicuous) appearance involving multiple right anterior ribs.  Prior interstitial markings without frank interstitial edema. No pleural effusion or pneumothorax.  Mild cardiomegaly.  Stable right IJ dual lumen dialysis catheter.  Nonspecific bowel gas pattern without disproportionate small bowel obstruction to suggest small bowel obstruction.  No evidence of free air under the diaphragm on the upright view.  Cholecystectomy clips.  Lumbar spine fixation hardware.  IMPRESSION: Possible 2.2 cm left upper lobe lung nodule, although this may be osseous in etiology.  Nonemergent CT chest is suggested for further characterization.  No evidence of small bowel obstruction or free air.   Original Report Authenticated By: Charline Bills, M.D.     Microbiology: Recent Results (from the past 240 hour(s))  MRSA PCR SCREENING     Status: Normal   Collection Time   08/31/12  1:00 AM      Component Value Range Status Comment   MRSA by PCR NEGATIVE  NEGATIVE Final      Labs: Basic Metabolic Panel:  Lab 08/31/12 4782 08/30/12 1213  NA 131* 129*  K 4.9 4.7  CL 88* 88*  CO2 20 22  GLUCOSE 141* 161*  BUN 52* 48*  CREATININE 6.33* 5.56*  CALCIUM 8.9 9.0  MG -- --  PHOS -- --   Liver Function Tests:  Lab 08/31/12 0513 08/30/12 1619  AST 8 13  ALT 6 7  ALKPHOS 196* 216*  BILITOT 0.1* 0.1*  PROT 6.4 7.0  ALBUMIN 1.9* 2.1*    Lab 08/30/12 1619  LIPASE 11  AMYLASE --   No results found for this basename: AMMONIA:5 in the last 168 hours CBC:  Lab 08/31/12 0513 08/30/12 1213  WBC 8.5 9.6  NEUTROABS -- 7.8*  HGB 10.0* 10.8*   HCT 32.9* 34.6*  MCV 98.8 97.7  PLT 374 335   Cardiac Enzymes: No results found for this basename: CKTOTAL:5,CKMB:5,CKMBINDEX:5,TROPONINI:5 in the last 168 hours BNP: BNP (last 3 results)  Basename 06/17/12 1633 02/14/12 1447  PROBNP 16777.0* 13271.0*   CBG:  Lab 08/31/12 1144 08/31/12 0740 08/30/12 2119 08/30/12 1845 08/30/12 1651  GLUCAP 136* 145* 155* 189* 162*       Signed:  Casimira Sutphin  [facility] 08/31/2012, 2:25 PM

## 2012-08-31 NOTE — Progress Notes (Signed)
ANTIBIOTIC CONSULT NOTE   Pharmacy Consult for Vancomycin Indication: cellulitis  Allergies  Allergen Reactions  . Compazine (Prochlorperazine) Swelling    Per echart records (takes promethazine at home)  . Contrast Media (Iodinated Diagnostic Agents) Swelling    Per echart records  . Meperidine Hcl Swelling  . Metformin And Related Other (See Comments)    'just not myself, was told not to take metformin'  . Morphine Swelling  . Nubain (Nalbuphine Hcl) Swelling  . Tobramycin Other (See Comments)    Unknown reaction  . Topiramate (Topamax) Swelling    Tongue swelling - per echart records  . Toradol (Ketorolac Tromethamine) Swelling   Patient Measurements: Height: 5' 10.5" (179.1 cm) Weight: 194 lb 14.2 oz (88.4 kg) IBW/kg (Calculated) : 69.65   Vital Signs: Temp: 98.1 F (36.7 C) (01/05 0532) Temp src: Oral (01/05 0532) BP: 148/72 mmHg (01/05 0532) Pulse Rate: 97  (01/05 0532) Intake/Output from previous day:   Intake/Output from this shift:    Labs:  Basename 08/31/12 0513 08/30/12 1213  WBC 8.5 9.6  HGB 10.0* 10.8*  PLT 374 335  LABCREA -- --  CREATININE 6.33* 5.56*   Estimated Creatinine Clearance: 14.1 ml/min (by C-G formula based on Cr of 6.33). No results found for this basename: VANCOTROUGH:2,VANCOPEAK:2,VANCORANDOM:2,GENTTROUGH:2,GENTPEAK:2,GENTRANDOM:2,TOBRATROUGH:2,TOBRAPEAK:2,TOBRARND:2,AMIKACINPEAK:2,AMIKACINTROU:2,AMIKACIN:2, in the last 72 hours   Microbiology: Recent Results (from the past 720 hour(s))  MRSA PCR SCREENING     Status: Normal   Collection Time   08/31/12  1:00 AM      Component Value Range Status Comment   MRSA by PCR NEGATIVE  NEGATIVE Final    Medical History: Past Medical History  Diagnosis Date  . DVT (deep venous thrombosis)     DVT HISTORY  . Diabetes mellitus   . Migraine     HISTORY  . CAD (coronary artery disease)     Last cath 2009:  LAD stent 80% stenosis. Circumflex 60-70% stenosis AV groove, right coronary  artery 50-60% stenosis. She did have cutting balloon angioplasty of the LAD lesion into a diagonal. However, there was restenosis of this and it was managed medically.  . Hyperlipidemia   . Hyperparathyroidism   . PE (pulmonary embolism)     HISTORY  . Dialysis patient   . Anemia   . Renal failure     dialysis [place] t,th sat  . Nephrotic syndrome   . Peripheral vascular disease   . MRSA infection   . Anxiety   . Gastroparesis   . Complication of anesthesia   . PONV (postoperative nausea and vomiting)     x3 days post anesth. on 12/19/2011  . Myocardial infarction     x3 , last one - 10 yrs. ago  . Hypertension     sees Dr. Antoine Poche - [place] , last stress test 11/2011, see result in EPIC, saw Dr. Antoine Poche as well at that time    . Shortness of breath   . Recurrent upper respiratory infection (URI)     Bronchitis 11/2011 - saw Dr. Loney Hering, treating currently /w antibiotic   . GERD (gastroesophageal reflux disease)   . Pneumonia     [facility]- 2012  . Noncompliance of patient with renal dialysis   . Cellulitis Oct. 2013  . MRSA (methicillin resistant staph aureus) culture positive Oct 2013  . Anxiety and depression   . Bipolar 1 disorder   . Depression   . Sleep apnea   . Oxygen dependent     2 L continuous   Medications:  Scheduled:     . ARIPiprazole  10 mg Oral Daily  . [COMPLETED] cefTRIAXone (ROCEPHIN) 1 g IVPB  1 g Intravenous Once  . cefTRIAXone (ROCEPHIN)  IV  1 g Intravenous Q24H  . cinacalcet  30 mg Oral Q supper  . clopidogrel  75 mg Oral Daily  . [COMPLETED] cefTRIAXone (ROCEPHIN) IVPB 1 gram/50 mL D5W      . docusate sodium  100 mg Oral BID  . feeding supplement (NEPRO CARB STEADY)  237 mL Oral TID  . fluticasone  1 spray Each Nare Daily  . heparin  5,000 Units Subcutaneous Q8H  . insulin aspart  0-9 Units Subcutaneous TID WC  . insulin glargine  15 Units Subcutaneous QHS  . metoCLOPramide (REGLAN) injection  5 mg Intravenous TID AC & HS  . nicotine  21 mg  Transdermal Daily  . [COMPLETED] ondansetron  4 mg Intravenous Once  . pantoprazole (PROTONIX) IV  40 mg Intravenous Q24H  . PARoxetine  80 mg Oral Daily  . sevelamer  1,600-4,000 mg Oral 5 X Daily  . vancomycin  500 mg Intravenous Once  . [COMPLETED] vancomycin  1,000 mg Intravenous STAT  . [DISCONTINUED] Nepro  237 mL Oral TID  . [DISCONTINUED] PARoxetine  80 mg Oral [id number]   Assessment: 42yo female with ESRD requiring dialysis.  Presented to ED with n/v and bilateral leg edema.  Starting Vancomycin for cellulitis.  Per Nephrology note, pt is 20-40 lbs over dry weight and is being scheduled for dialysis today.  Pt got Vancomycin 1gm yesterday.  Will give additional Vancomycin 500mg  today after dialysis.  Goal of Therapy:  pre-dialysis vancomycin level 10-20 Treat cellulitis at graft area  Plan: Vancomycin 500mg  IV today x 1 after dialysis. F/U plans for dialysis to schedule additional vancomycin doses.  [name], [name] A 08/31/2012,10:15 AM

## 2012-08-31 NOTE — Progress Notes (Signed)
Pt stated that it was okay to discuss her plan of care with her family particulaly her sister Lupita Leash with the use of the password [email or web address].

## 2012-08-31 NOTE — Progress Notes (Signed)
Pt d/c instructions, f/u appts and medications reviewed with pt and family. No issues or concerns addressed. Pt d/ced home at this time. Taken out of facility via w/c by staff.

## 2012-09-01 ENCOUNTER — Telehealth: Payer: Self-pay | Admitting: Neurosurgery

## 2012-09-01 MED FILL — Nutritional Supplement Liquid: ORAL | Qty: 237 | Status: AC

## 2012-09-01 NOTE — Telephone Encounter (Signed)
When Alyric saw [place] on Thursday, 1/2, he prescribed Keflex for the knot on her arm where the graft was removed.  She was in [place] from Saturday to Sunday for a UTI and was prescribed Cipro, which she is still taking on Tu-Th-Sa after dialysis.  She was prescribed vancomycin for cellulitis, which she gets during dialysis.  Should she still take the Keflex, as well, until she sees [place] on Wednesday?

## 2012-09-01 NOTE — Telephone Encounter (Signed)
Per Verlon Setting the Cipro will cover her; so she can stop the Keflex. I have called the patient; spoke with [name];she verbalized understanding and knows to keep the appt on Wed.

## 2012-09-02 ENCOUNTER — Encounter: Payer: Self-pay | Admitting: Neurosurgery

## 2012-09-03 ENCOUNTER — Encounter: Payer: Self-pay | Admitting: Neurosurgery

## 2012-09-03 ENCOUNTER — Ambulatory Visit (INDEPENDENT_AMBULATORY_CARE_PROVIDER_SITE_OTHER): Payer: Medicare Other | Admitting: Neurosurgery

## 2012-09-03 VITALS — BP 144/83 | HR 96 | Resp 20 | Ht 70.5 in | Wt 190.0 lb

## 2012-09-03 DIAGNOSIS — N186 End stage renal disease: Secondary | ICD-10-CM

## 2012-09-03 NOTE — Progress Notes (Signed)
Subjective:     Patient ID: [name], female   DOB: 12/22/1970, 42 y.o.   MRN: [id number]  HPI:42 year old female patient has been seen by multiple providers in this practice for various access issues. I saw the patient last week for complaints of a red area in her left antecubital and started the patient on Keflex. This was subsequently stopped as she is also on suppressive and receiving IV vancomycin with dialysis. The patient is dialyzing through a right chest catheter. The patient was most recently seen 08/04/2012 by Dr. Myra Gianotti status post aortobifemoral bypass graft with right femoral to popliteal bypass that was done in May of 2013 and is scheduled to followup with Dr. Myra Gianotti in June of 2014 for evaluation of this. The patient reports decreased redness in the left decubital area and is having no pain or drainage.    Review of Systems: 12 point review of systems is notable for the difficulties described above otherwise unremarkable     Objective:   Physical Exam: Afebrile, vital signs are stable, the left antecubital area is without redness or drainage and the raised area has diminished in size.     Assessment:     Assessment as above    Plan:     The patient will continue her Cipro to her her other physician as well as her IV vancomycin with dialysis. She will followup with Dr. Myra Gianotti as scheduled in June, her followup here for access issues will be when necessary. The patient's questions were encouraged and answered she and her mother are in agreement with this plan.  Lauree Chandler ANP  M.D.: Edilia Bo

## 2012-09-27 DEATH — deceased

## 2012-10-03 IMAGING — CR DG TIBIA/FIBULA 2V*R*
2 series · 2 of 2 positions shown · non-contrast
Comparison: None.

CLINICAL DATA: Distal wound check

RIGHT TIBIA AND FIBULA - 2 VIEW

[view not recorded (1 of 2)]
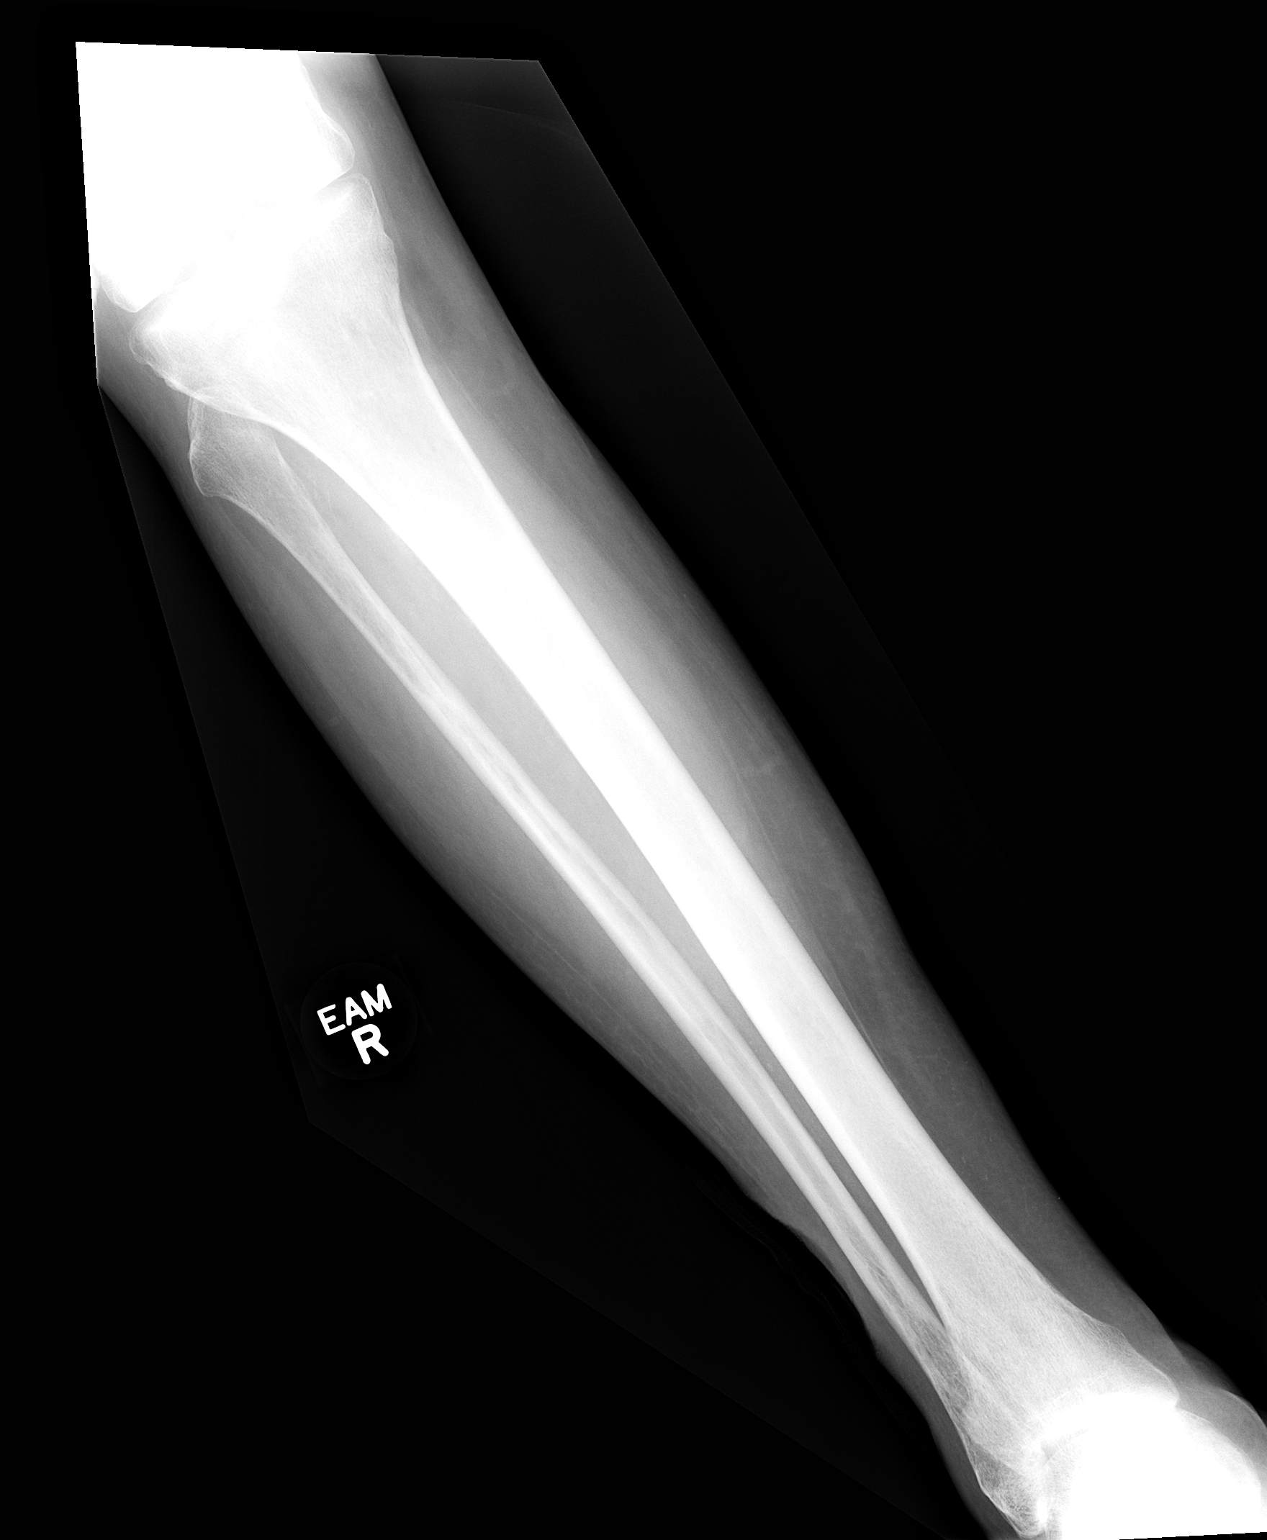

[view not recorded (2 of 2)]
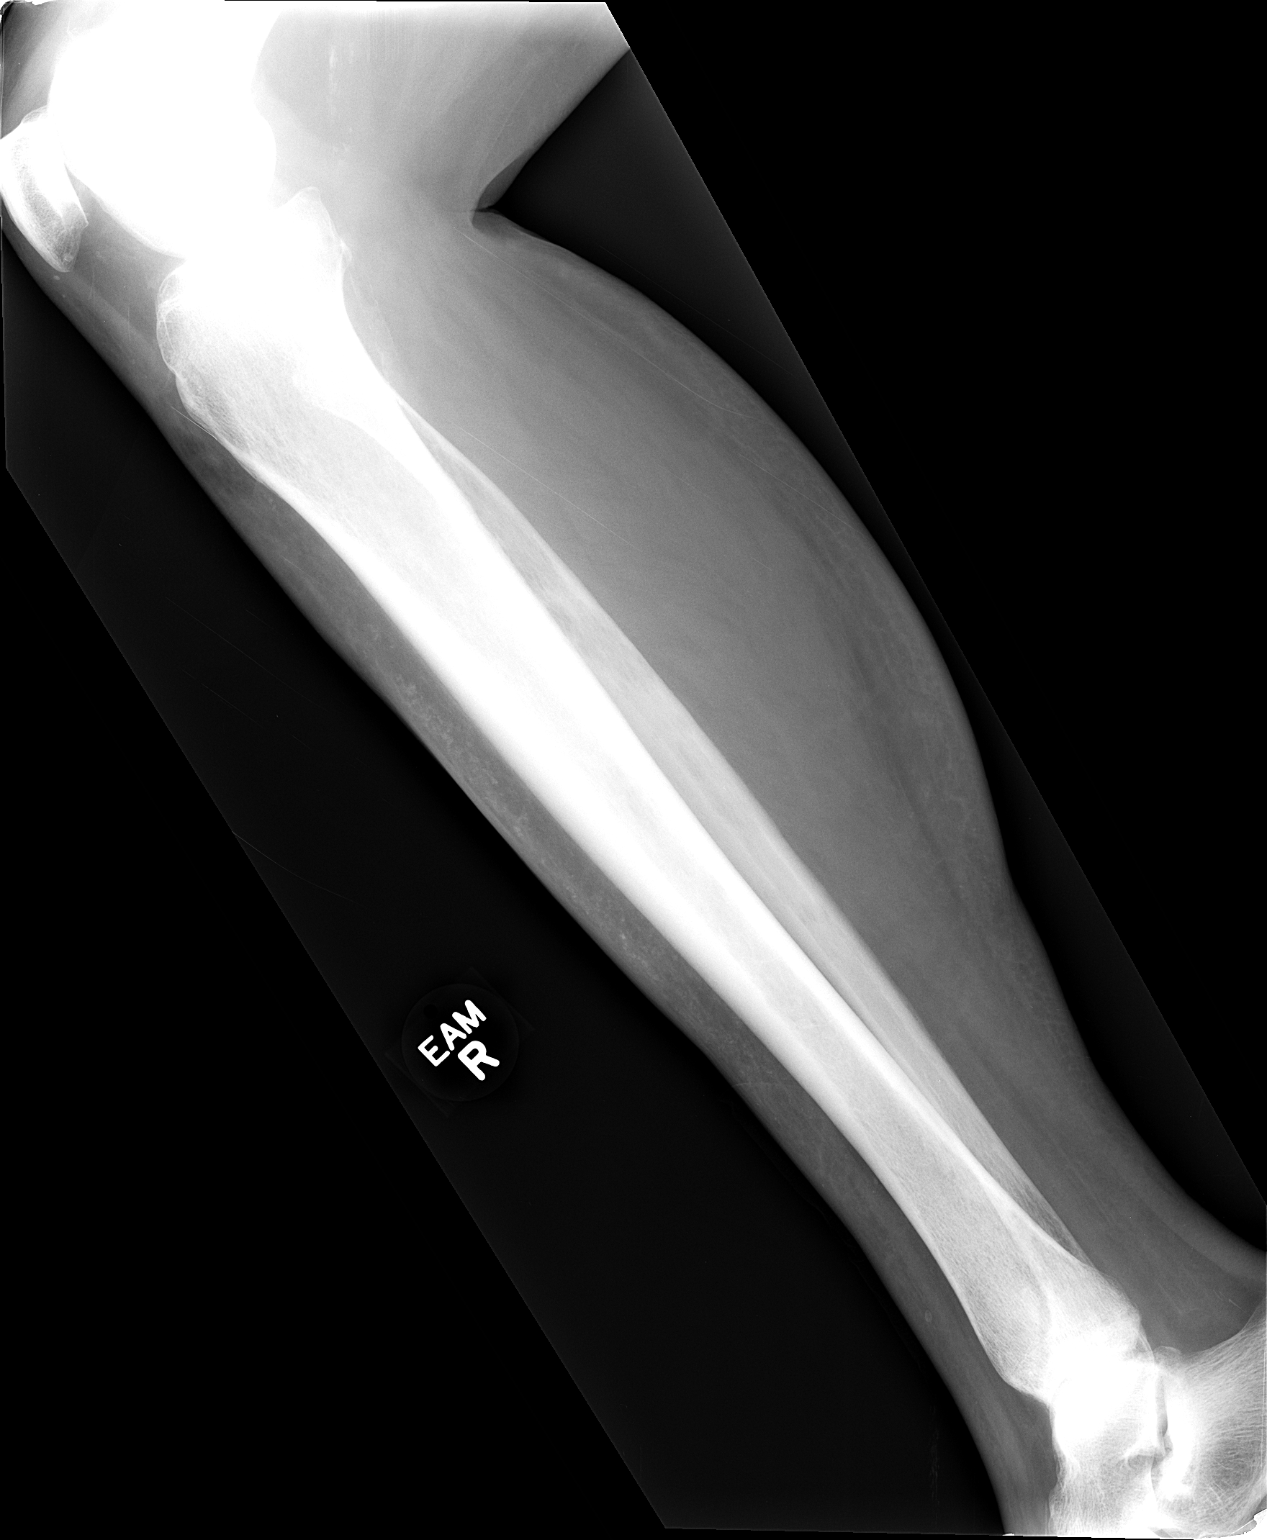

[2 of 2 positions shown; findings below may reference images not displayed]

FINDINGS: Two views of the right tibia-fibula submitted.  No acute
fracture or subluxation.  There is soft tissue irregularity lateral
distal fibular region.  Clinical correlation is necessary.  No
periosteal reaction or bony erosion.
IMPRESSION: No acute fracture or subluxation.  No periosteal reaction or bony
erosion to suggest osteomyelitis.

## 2012-10-27 IMAGING — CR DG ABDOMEN ACUTE W/ 1V CHEST
3 series · 3 of 3 positions shown · non-contrast
Comparison: None.

CLINICAL DATA: Abdominal pain.  Vomiting.

ACUTE ABDOMEN SERIES (ABDOMEN 2 VIEW & CHEST 1 VIEW)

[view not recorded (1 of 3)]
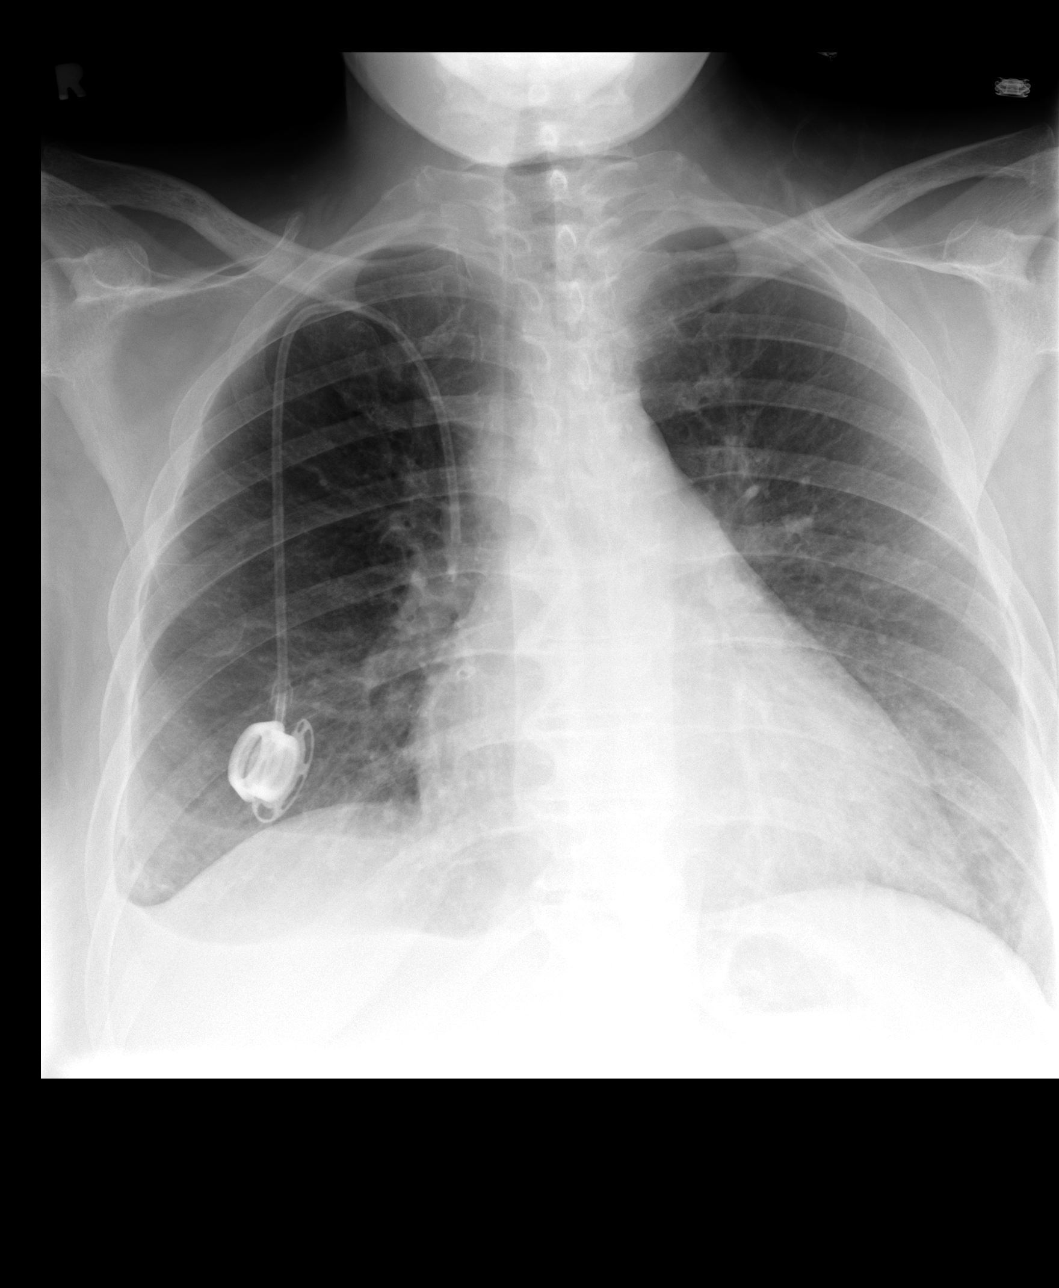

[view not recorded (2 of 3)]
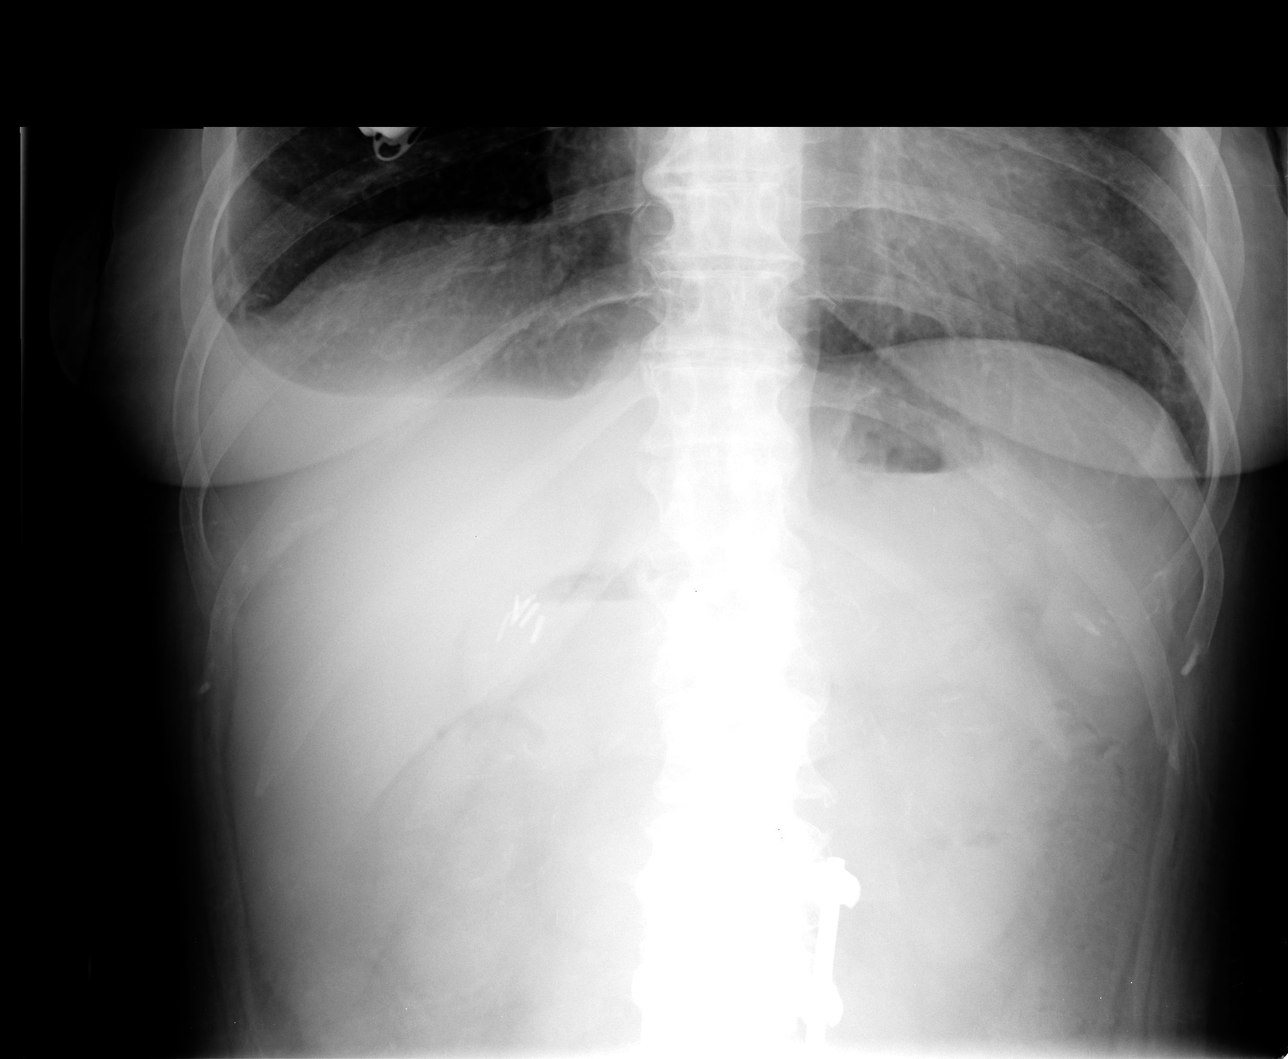

[view not recorded (3 of 3)]
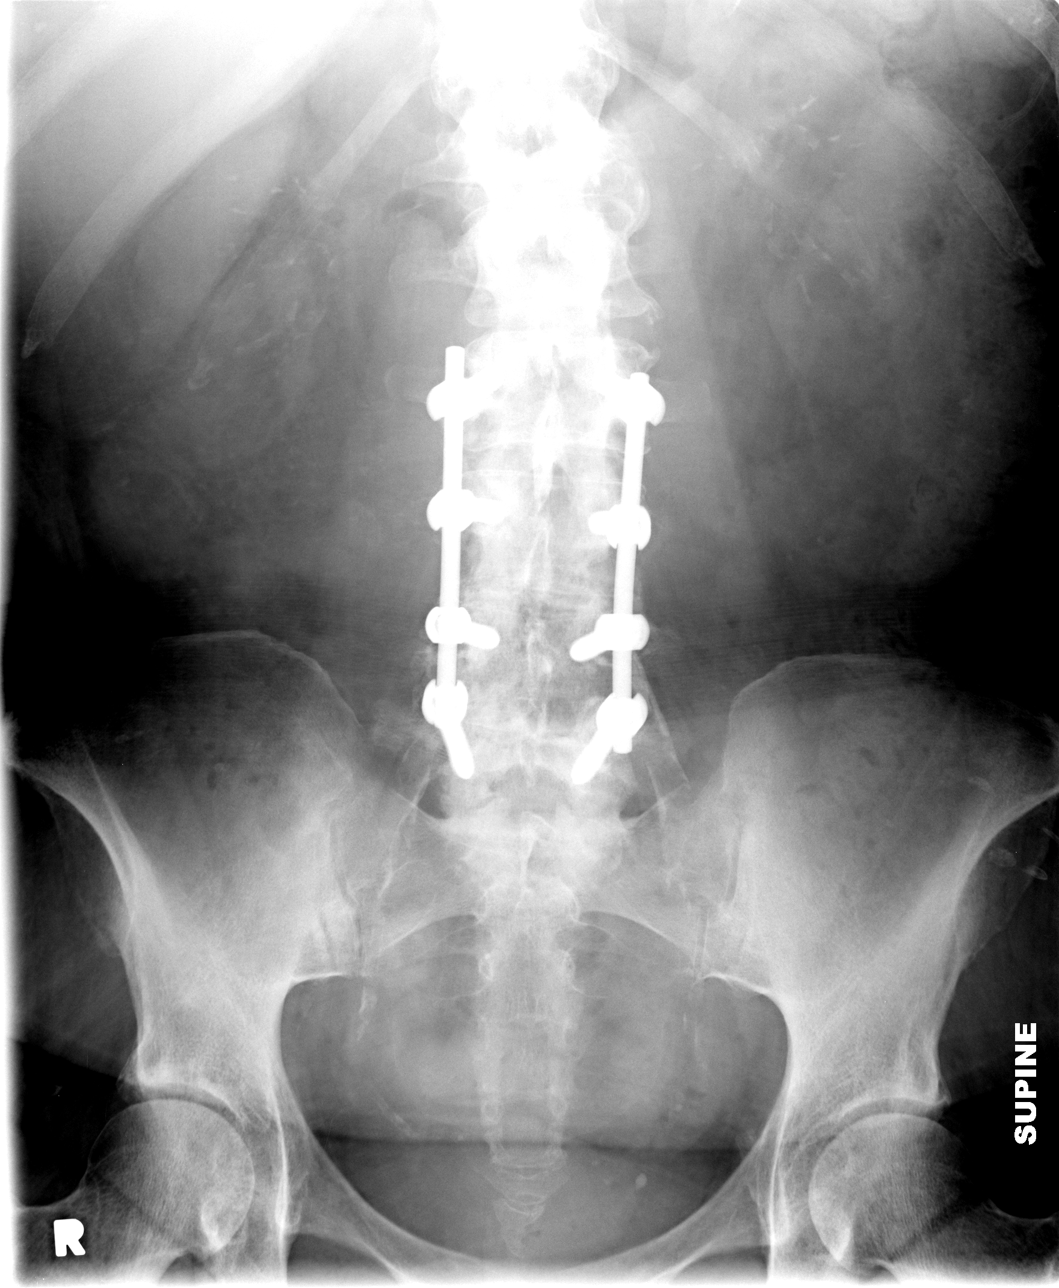

[3 of 3 positions shown; findings below may reference images not displayed]

FINDINGS: There is cardiomegaly with a Port-A-Cath in place. There
is a small right pleural effusion.  Faint patchy infiltrate or
parenchymal scarring at the left lung base laterally.

No free air or free fluid in the abdomen.  The abdomen is
essentially devoid of bowel gas.  No dilated loops of bowel.
Harrington rods in the lower lumbar spine.  Extensive vascular
calcification.
IMPRESSION: 1.  Small right pleural effusion.
2.  Small area of infiltrate or parenchymal scarring at the left
lung base.
3.  Cardiomegaly.
4.  No acute abnormalities of the abdomen.

## 2012-10-27 IMAGING — CR DG TIBIA/FIBULA 2V*R*
2 series · 2 of 2 positions shown · non-contrast
Comparison: 10/06/2011

CLINICAL DATA: Soft tissue ulcerations and pain in the right lower
leg.

RIGHT TIBIA AND FIBULA - 2 VIEW

[view not recorded (1 of 2)]
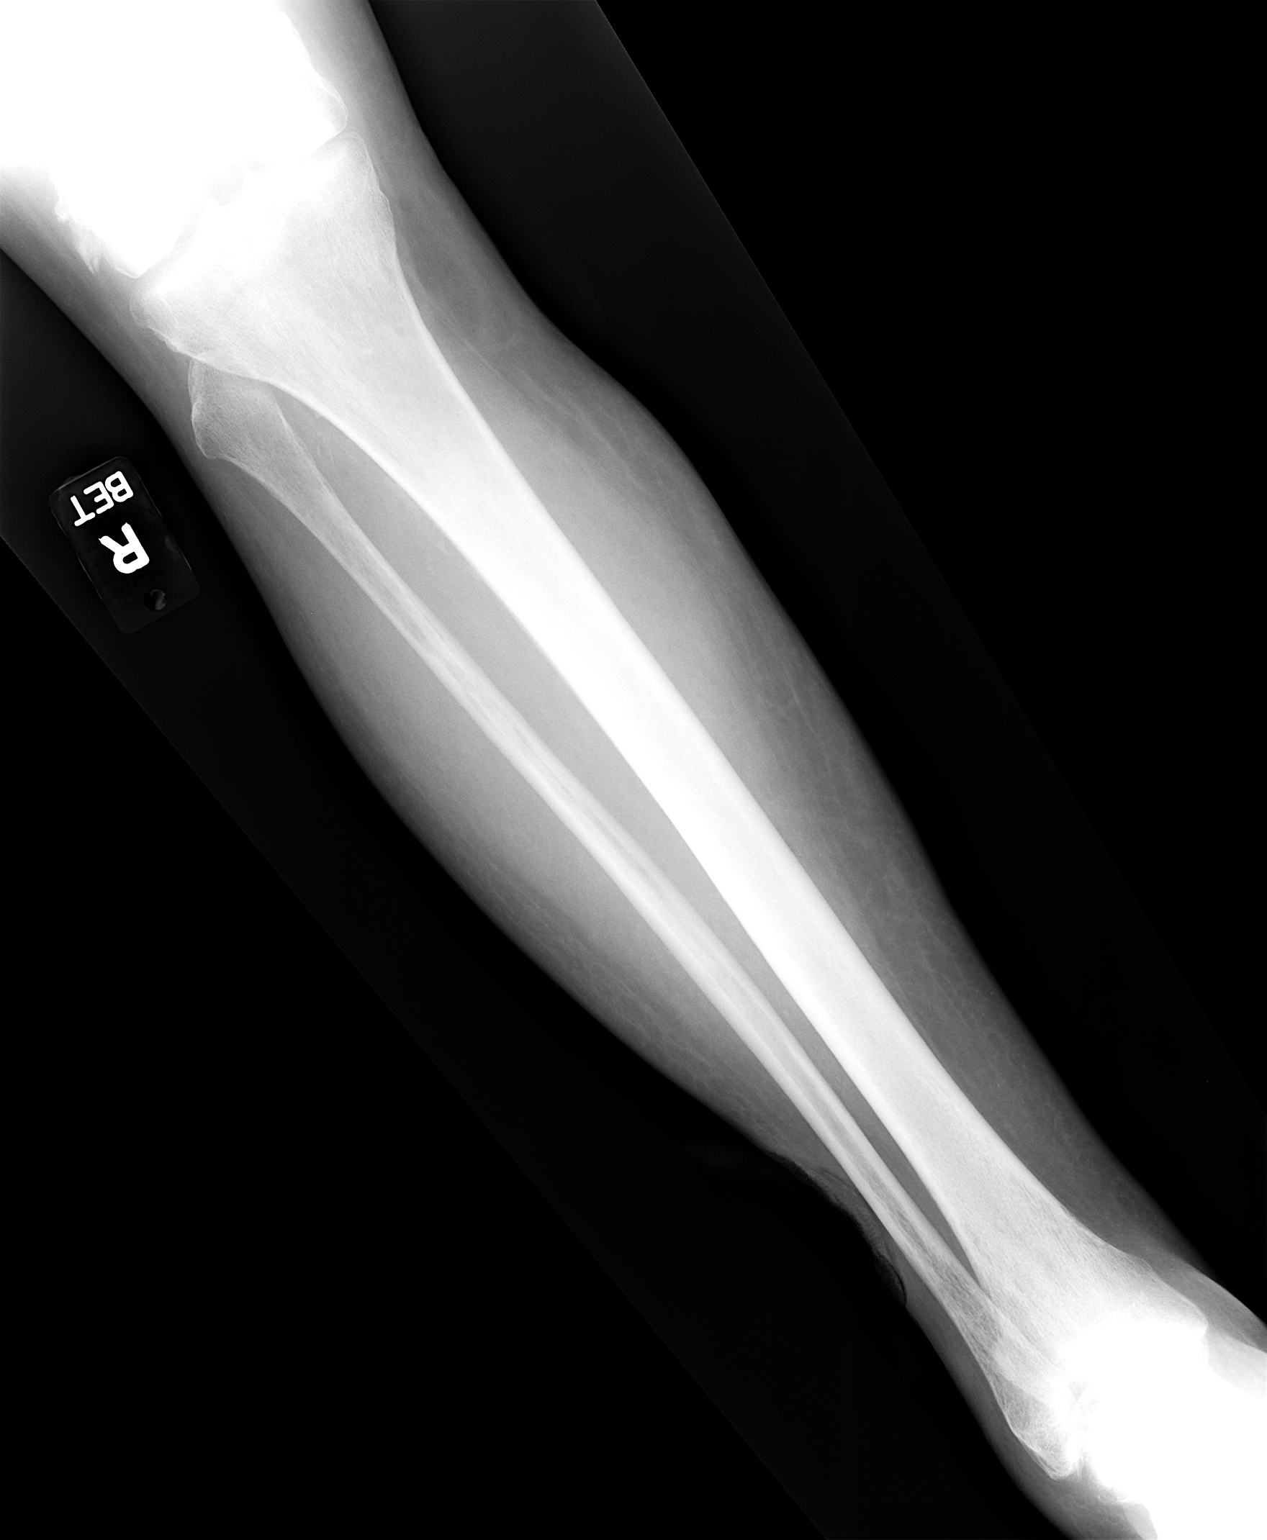

[view not recorded (2 of 2)]
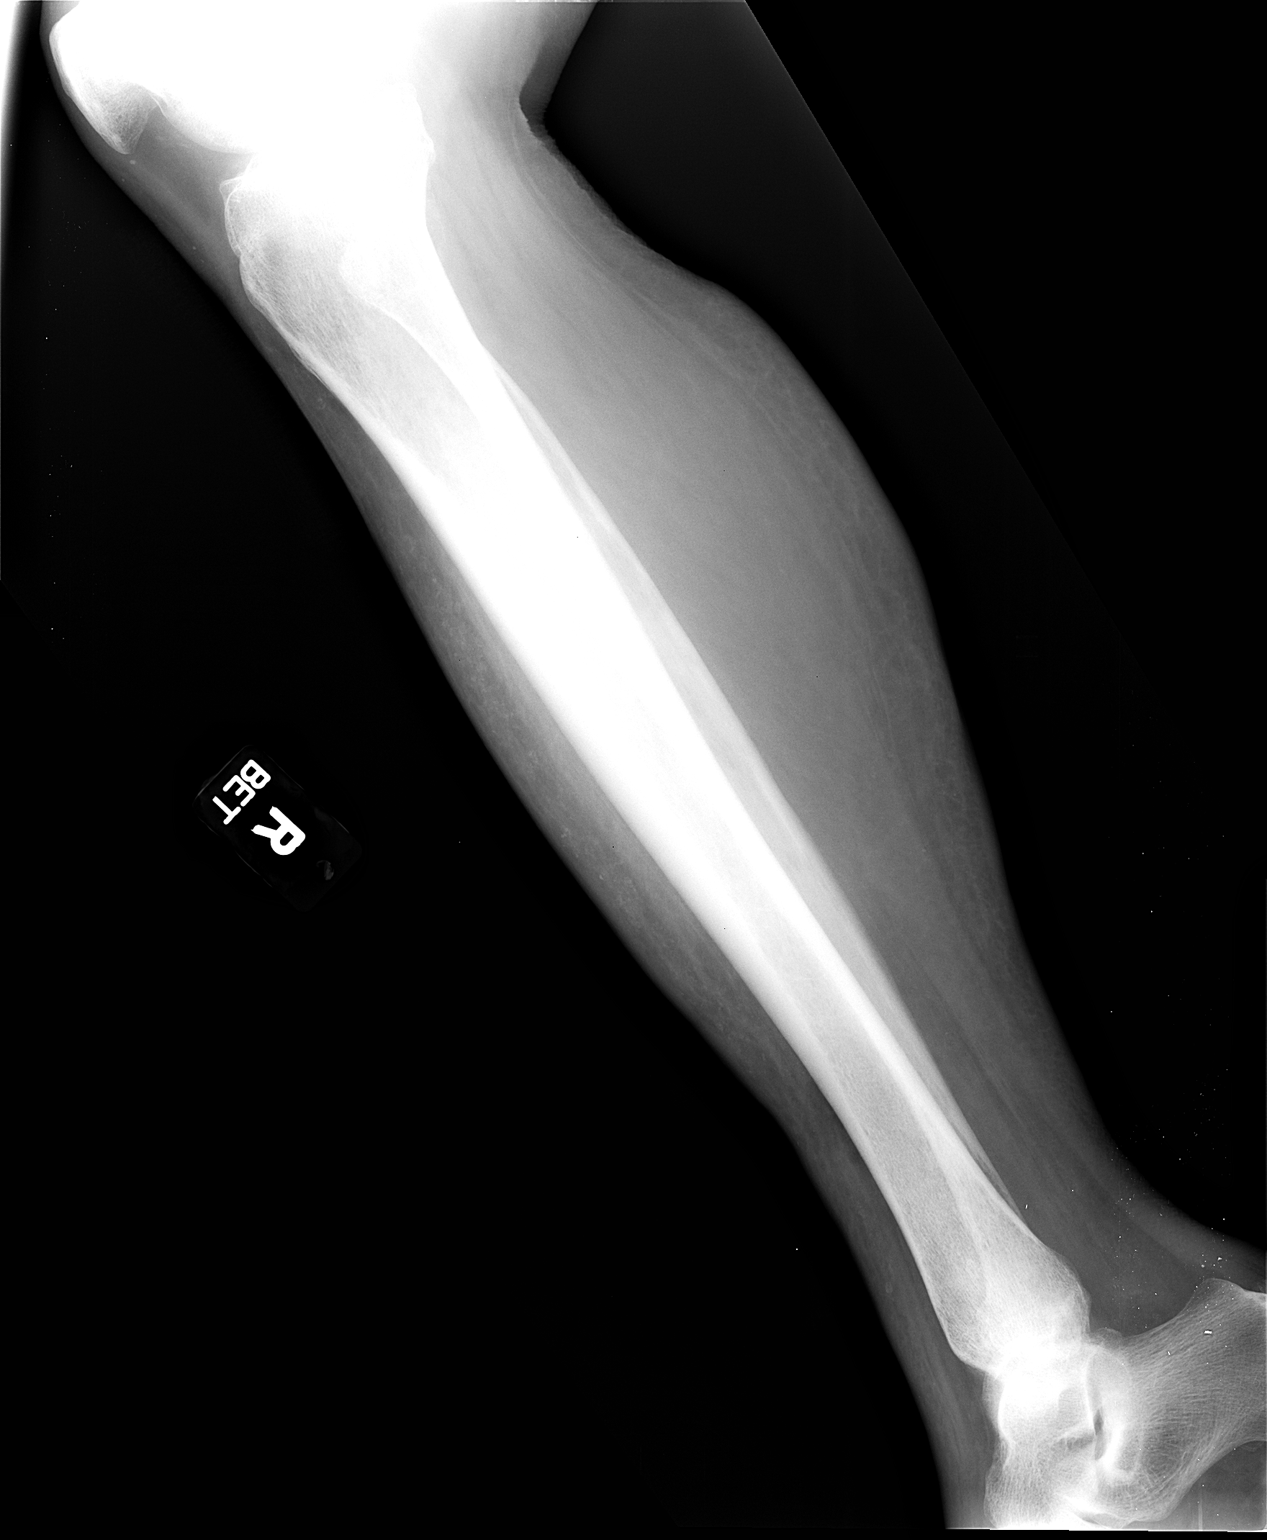

[2 of 2 positions shown; findings below may reference images not displayed]

FINDINGS: The tibia and fibula appear normal with no periosteal
reaction or other significant abnormality.  Soft tissue ulceration
of the distal lateral aspect of the right lower leg is more
prominent than on the prior exam.
IMPRESSION: No evidence of osteomyelitis or other acute osseous abnormality.

## 2012-10-27 IMAGING — CR DG FOOT COMPLETE 3+V*R*
3 series · 3 of 3 positions shown · non-contrast
Comparison: 10/06/2011

CLINICAL DATA: .  Right lower leg and right foot ulcerations.
Right lower leg pain.

RIGHT FOOT COMPLETE - 3+ VIEW

[view not recorded (1 of 3)]
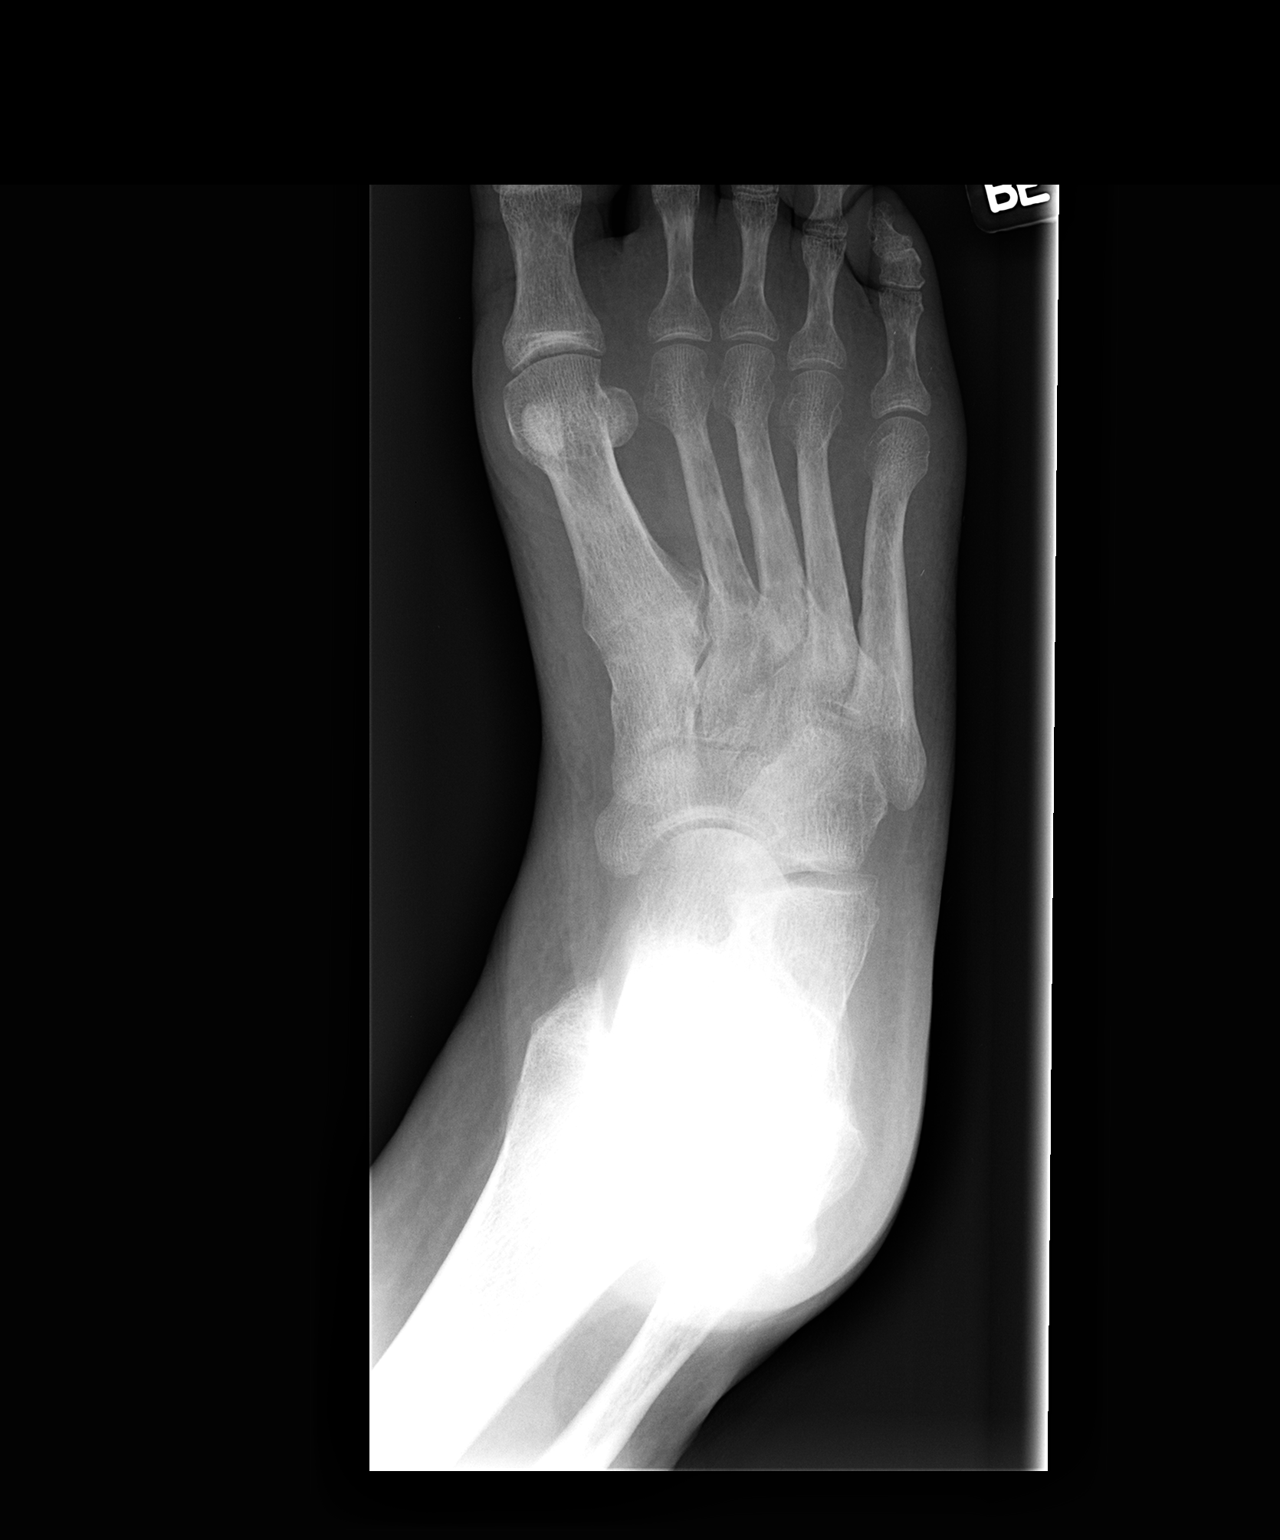

[view not recorded (2 of 3)]
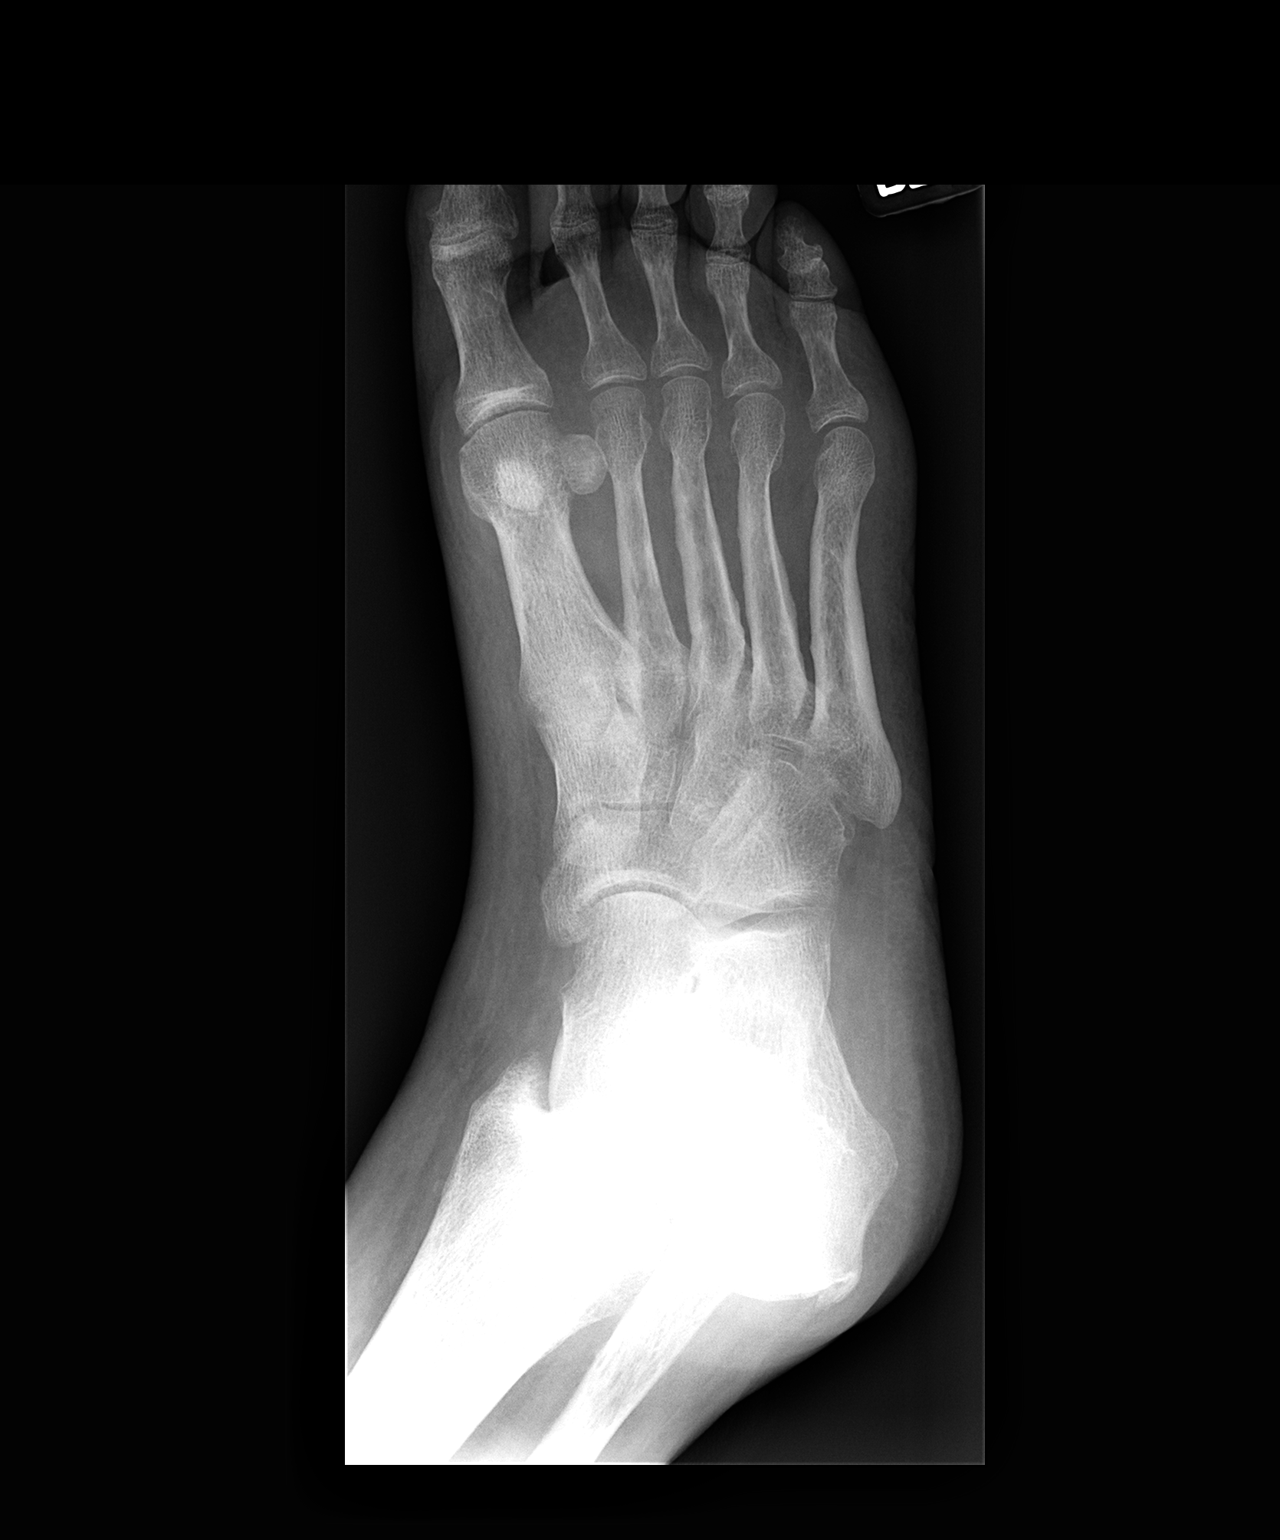

[view not recorded (3 of 3)]
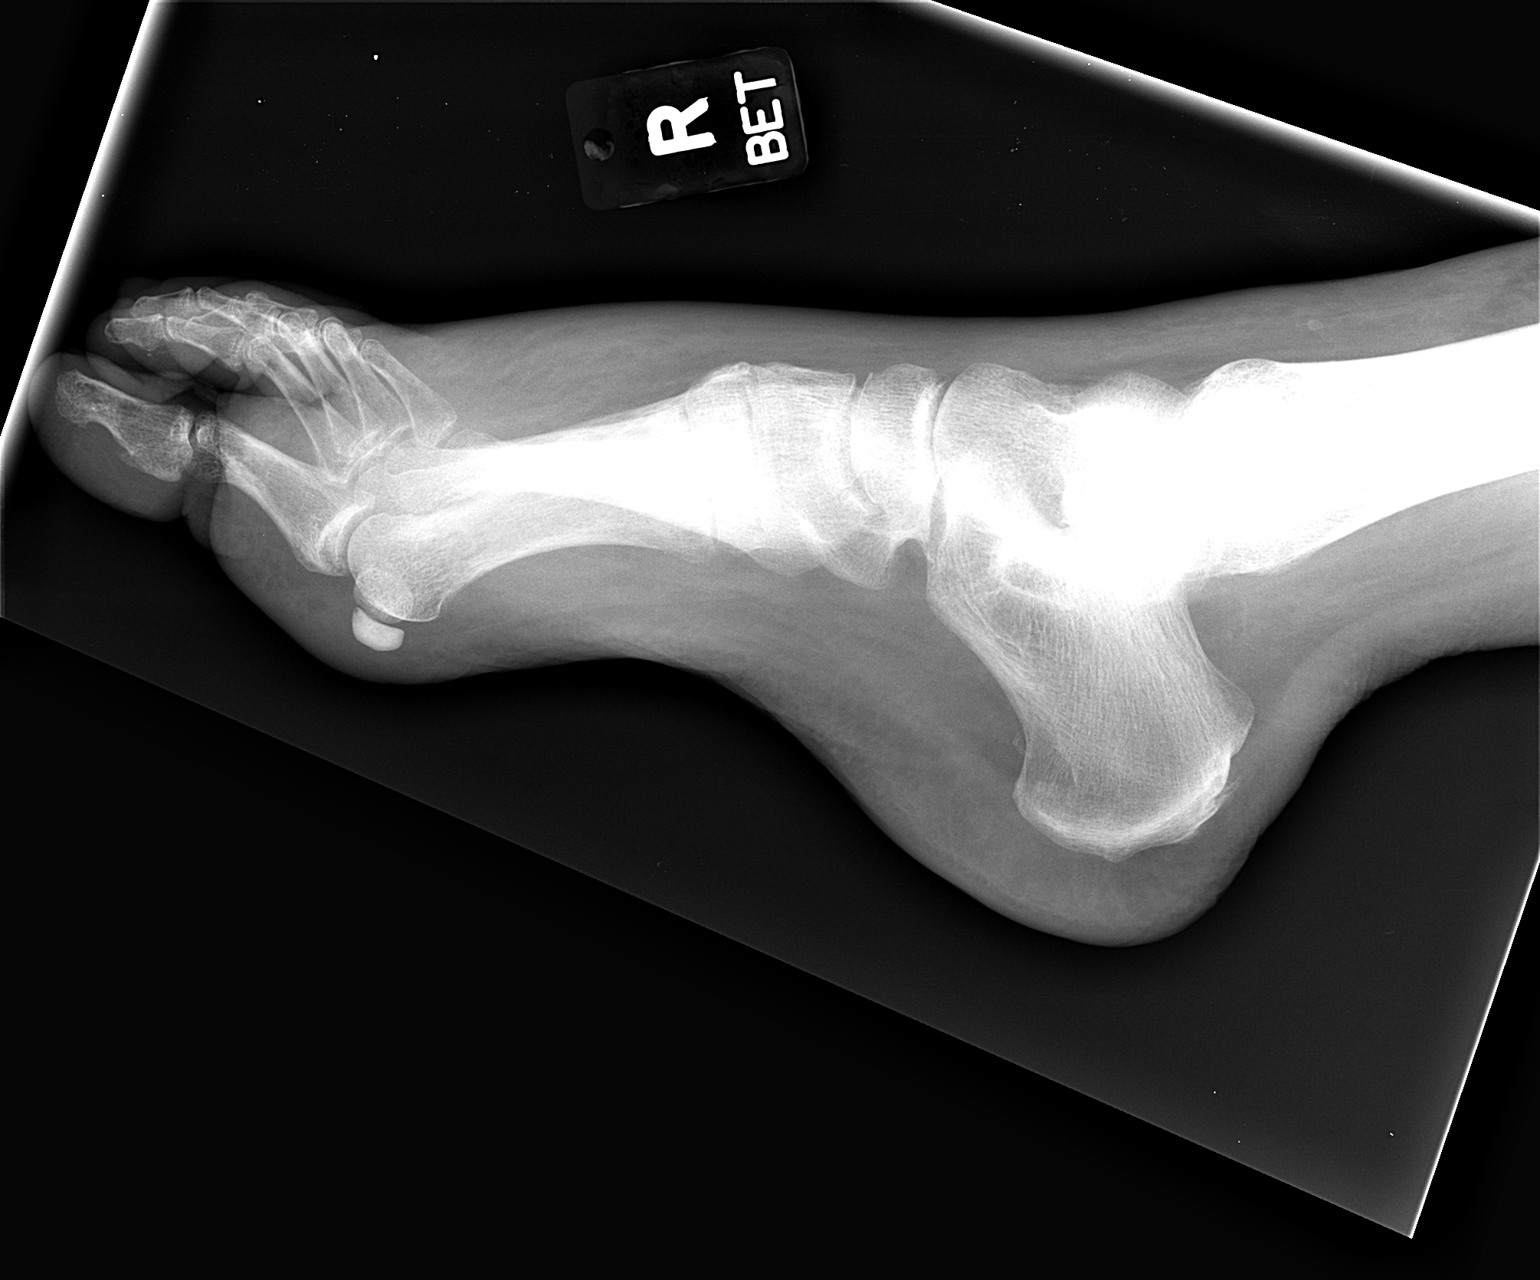

[3 of 3 positions shown; findings below may reference images not displayed]

FINDINGS: There is no evidence of osteomyelitis or significant
arthritis. No visible joint effusions or bone destruction.
IMPRESSION: No significant abnormalities.  No change.

## 2012-10-30 IMAGING — XA IR AV DIALYSIS GRAFT DECLOT
1 series · 12 of 24 positions shown · non-contrast
Comparison: none

CLINICAL DATA: End-stage renal disease, occluded left upper arm AV
graft

[Series 1: run · 12 of 52 slices shown]
[im 3/52]
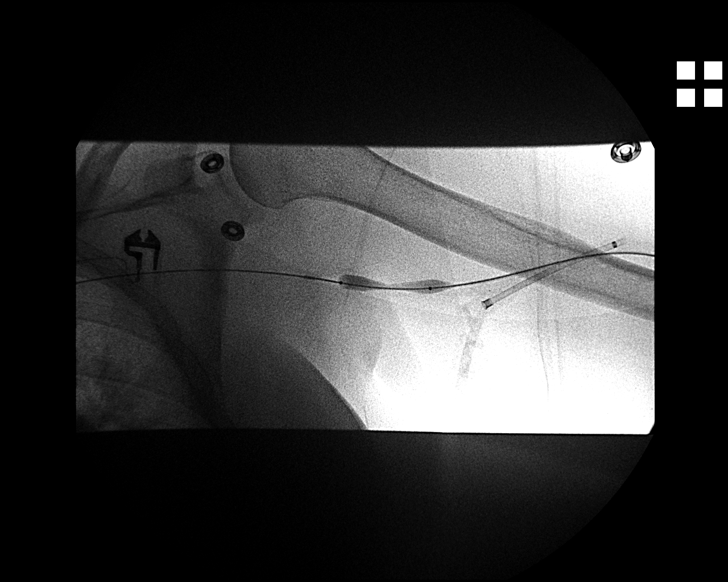
[im 7/52]
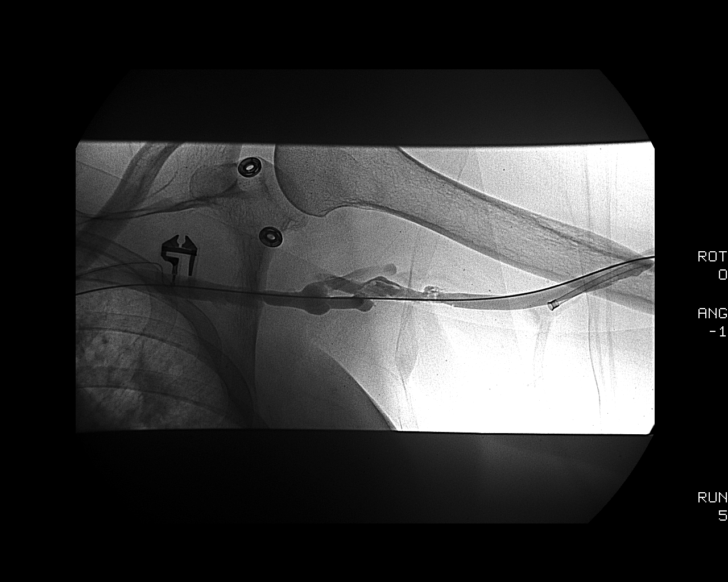
[im 12/52]
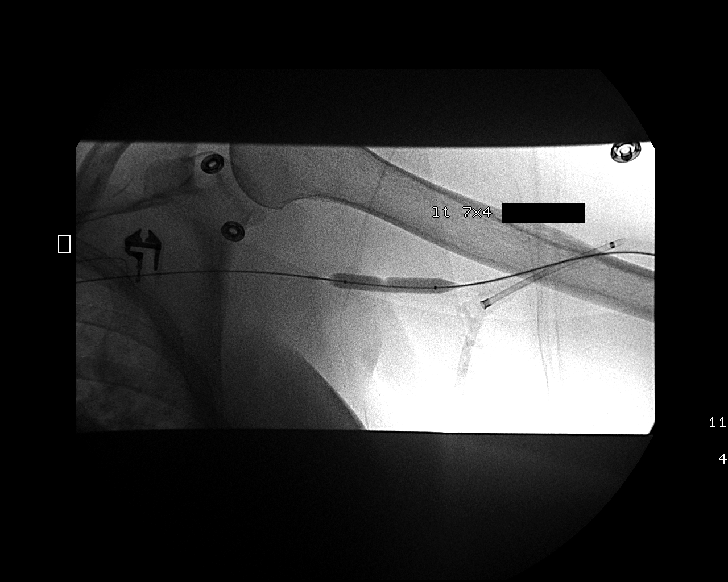
[im 16/52]
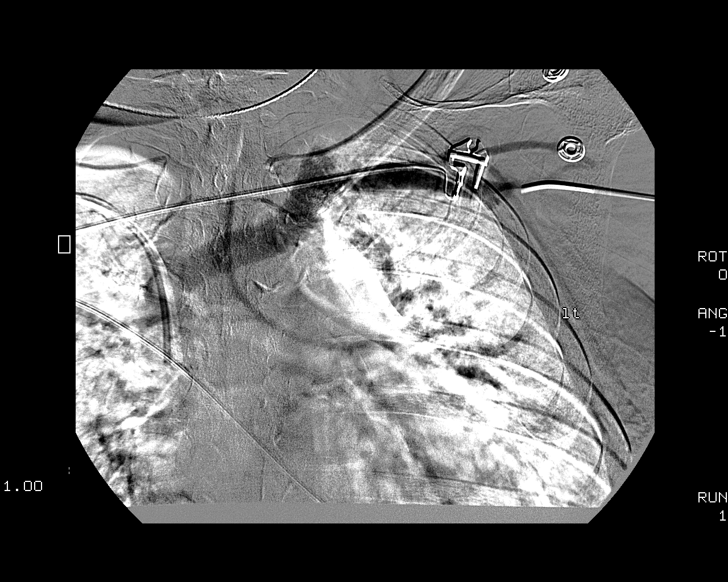
[im 20/52]
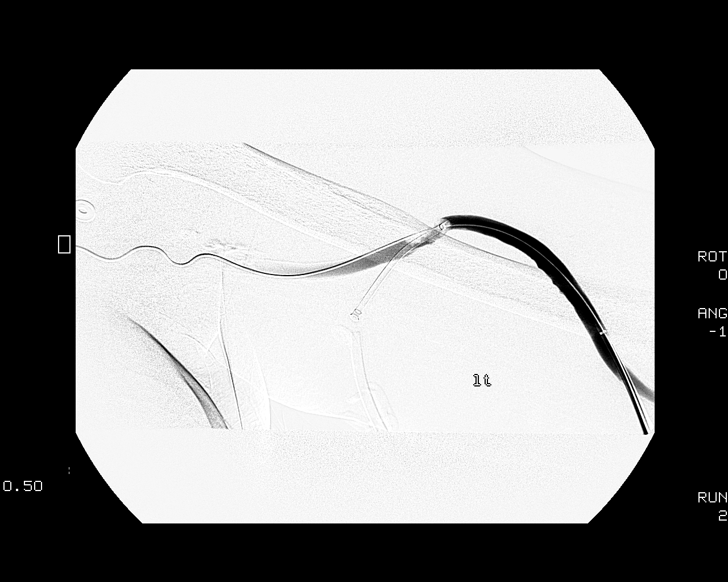
[im 25/52]
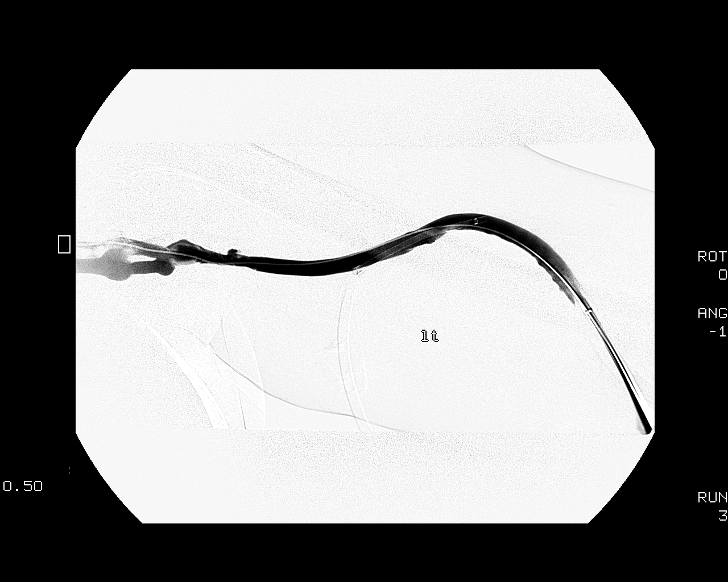
[im 29/52]
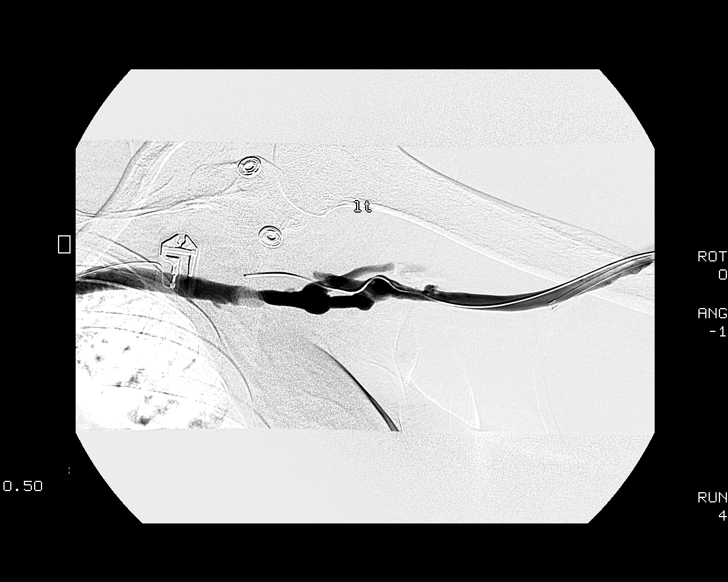
[im 34/52]
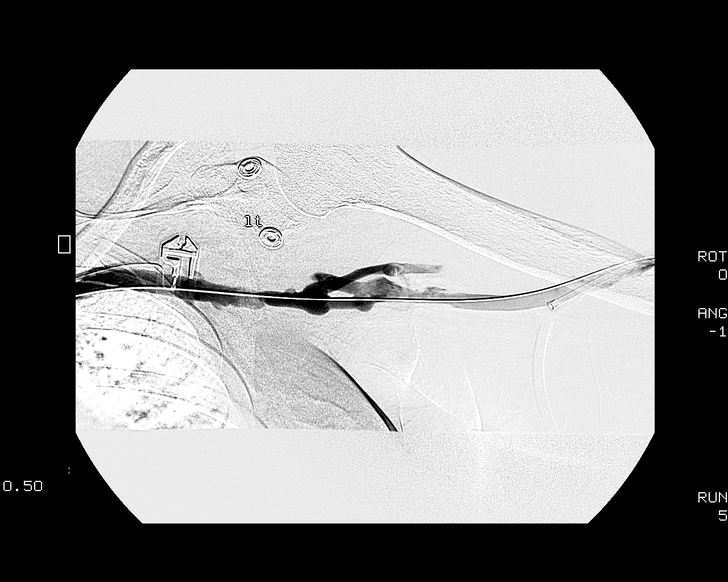
[im 38/52]
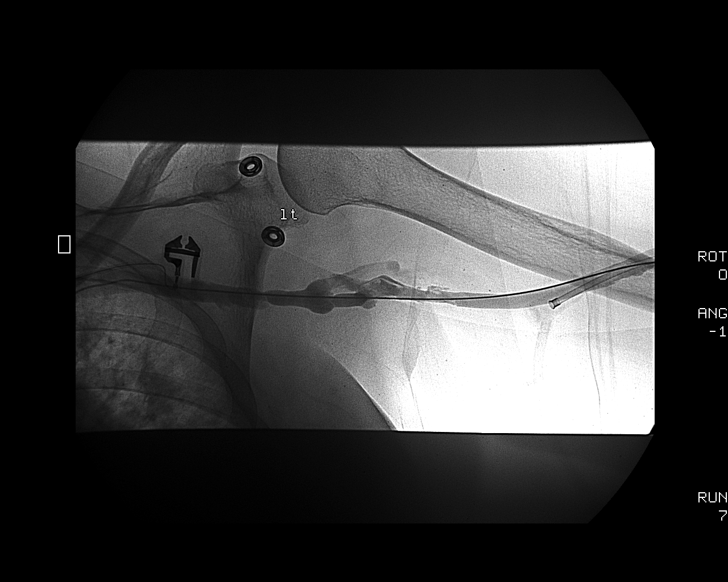
[im 43/52]
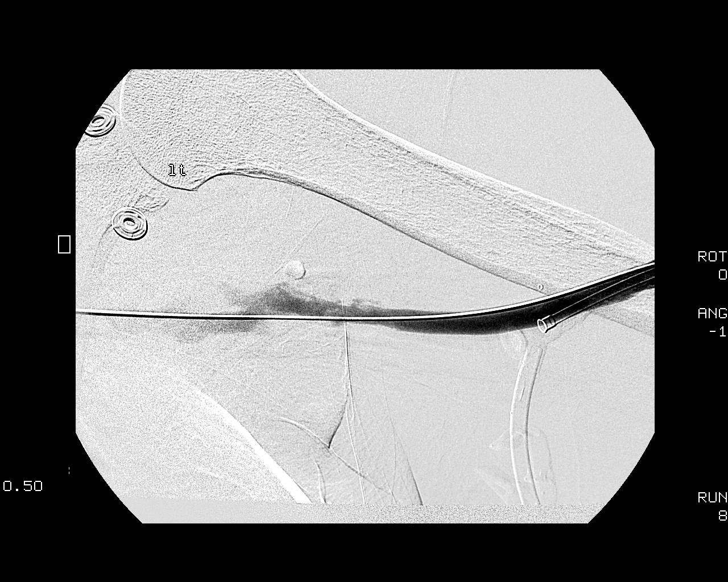
[im 47/52]
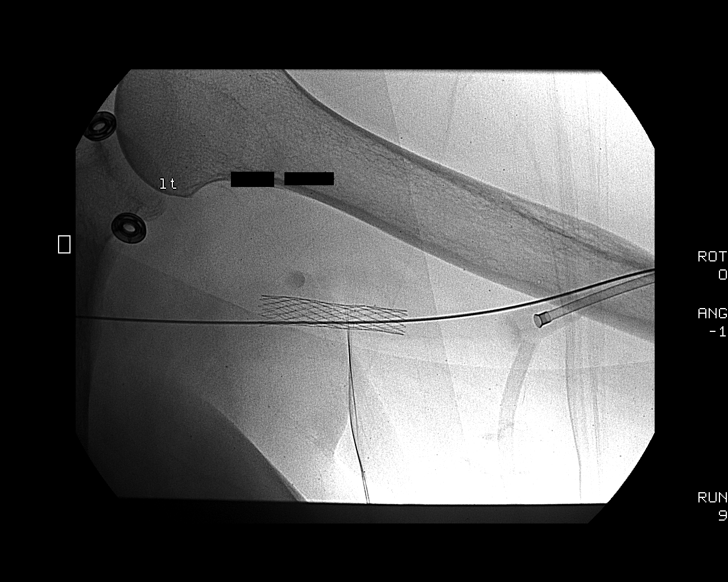
[im 52/52]
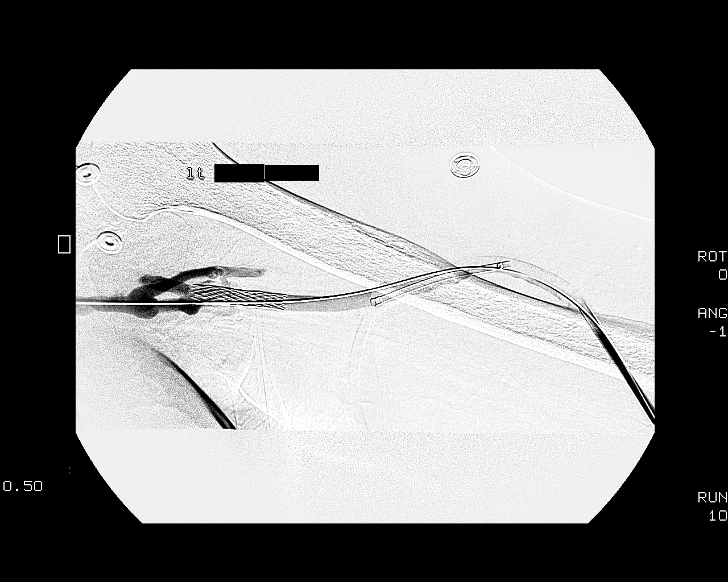

[12 of 24 positions shown; findings below may reference images not displayed]

ULTRASOUND GUIDANCE VASCULAR ACCESS
LEFT UPPER ARM AV GRAFT THROMBOLYSIS/THROMBECTOMY
7 MM VENOUS ANGIOPLASTY
7 X 4 COVERED STENT PLACEMENT OF THE VENOUS ANASTOMOSIS (FLAIR
STENT)

Date:  11/02/2011 [DATE]

Radiologist:  Bryan Geovanny Moncallo, M.D.

Medications:  2 mg Versed, 50 mcg Fentanyl, 2 mg TPA, 1333 units
heparin

Guidance:  Ultrasound and fluoroscopic

Fluoroscopy time:  8.3 minutes

Sedation time:  43 minutes

Contrast volume:  60 ml 0mnipaque-7OO

Complications:  No immediate

PROCEDURE/FINDINGS:

Informed consent was obtained from the patient following
explanation of the procedure, risks, benefits and alternatives.
The patient understands, agrees and consents for the procedure.
All questions were addressed.  A time out was performed.

Maximal barrier sterile technique utilized including caps, mask,
sterile gowns, sterile gloves, large sterile drape, hand hygiene,
and betadine

Under sterile conditions and local anesthesia, the left upper arm
occluded graft was accessed at 2 separate sites.  6-French sheath
initially inserted.  Contrast injection confirms thrombotic
occlusion.  Catheter and guide wire advanced across the venous
anastomosis.  Outflow venogram confirms patency of the central
veins.  Pullback venogram confirms thrombotic occlusion to the high
brachial vein.  2 mg TPA instilled for thrombolysis.  Mechanical
thrombectomy performed with the AngioJet device.  7 mm overlapping
angioplasty performed of the venous anastomosis and throughout the
graft.  Graft inflow reestablished by passing a 5.5 Fogarty across
the arterial anastomosis.  Sheaths were back bled and syringe
aspirated.  Initial shuntogram performed.

Initial shuntogram:  AV graft is now patent.  Residual thrombus
within the graft noted at one of the sheaths.  The venous
anastomosis demonstrates diffuse wall irregularity and minor
extravasation related to the angioplasty.  This is somewhat flow
limiting.

Additional AngioJet thrombectomy was performed throughout the graft
to remove the residual thrombus.  Additional 7 mm overlapping
angioplasty was performed of the venous anastomosis.

Following these additional treatments, repeat shuntogram again
demonstrates diffuse wall irregularity and contained extravasation
at the venous anastomosis.  This did not respond to prolonged 7 mm
angioplasty.

Because of this, the access will be at high risk for reocclusion.

Stent placement:  Over the guide wire, one of the sheaths was
upsized to a 9-French sheath.  Localization shuntogram performed.
To treat the venous anastomotic extravasation siteA, a 7 x 4
covered stent (FLAIR stent) was deployed centered on the venous
anastomosis.  This was inflated with a 7 mm balloon.  Following
this, a final shuntogram demonstrated wide patency of the shunt and
brisk venous outflow into the axillary vein.  No residual stenosis
or wall irregularity.  No residual extravasation or leakage.

Sheaths removed.  Hemostasis obtained with pursestring sutures.  No
immediate complication.  The patient tolerated the procedure well.
IMPRESSION: Successful left upper arm AV graft thrombolysis, thrombectomy and
venous angioplasty to restore flow.  Venous anastomotic angioplasty
resulted in high brachial venous injury with a small contained
extravasation.  This did not respond or resolve with prolonged
venous angioplasty therefore further treatment was performed by
placement of a 7 x 4 FLAIR covered stent.  Access ready for use.

Access management:  This access remains amenable intervention.

## 2012-11-06 IMAGING — CR DG CHEST 2V
2 series · 2 of 2 positions shown · non-contrast
Comparison: Chest x-ray 08/20/2011.

CLINICAL DATA: Productive cough.

CHEST - 2 VIEW

[view not recorded (1 of 2)]
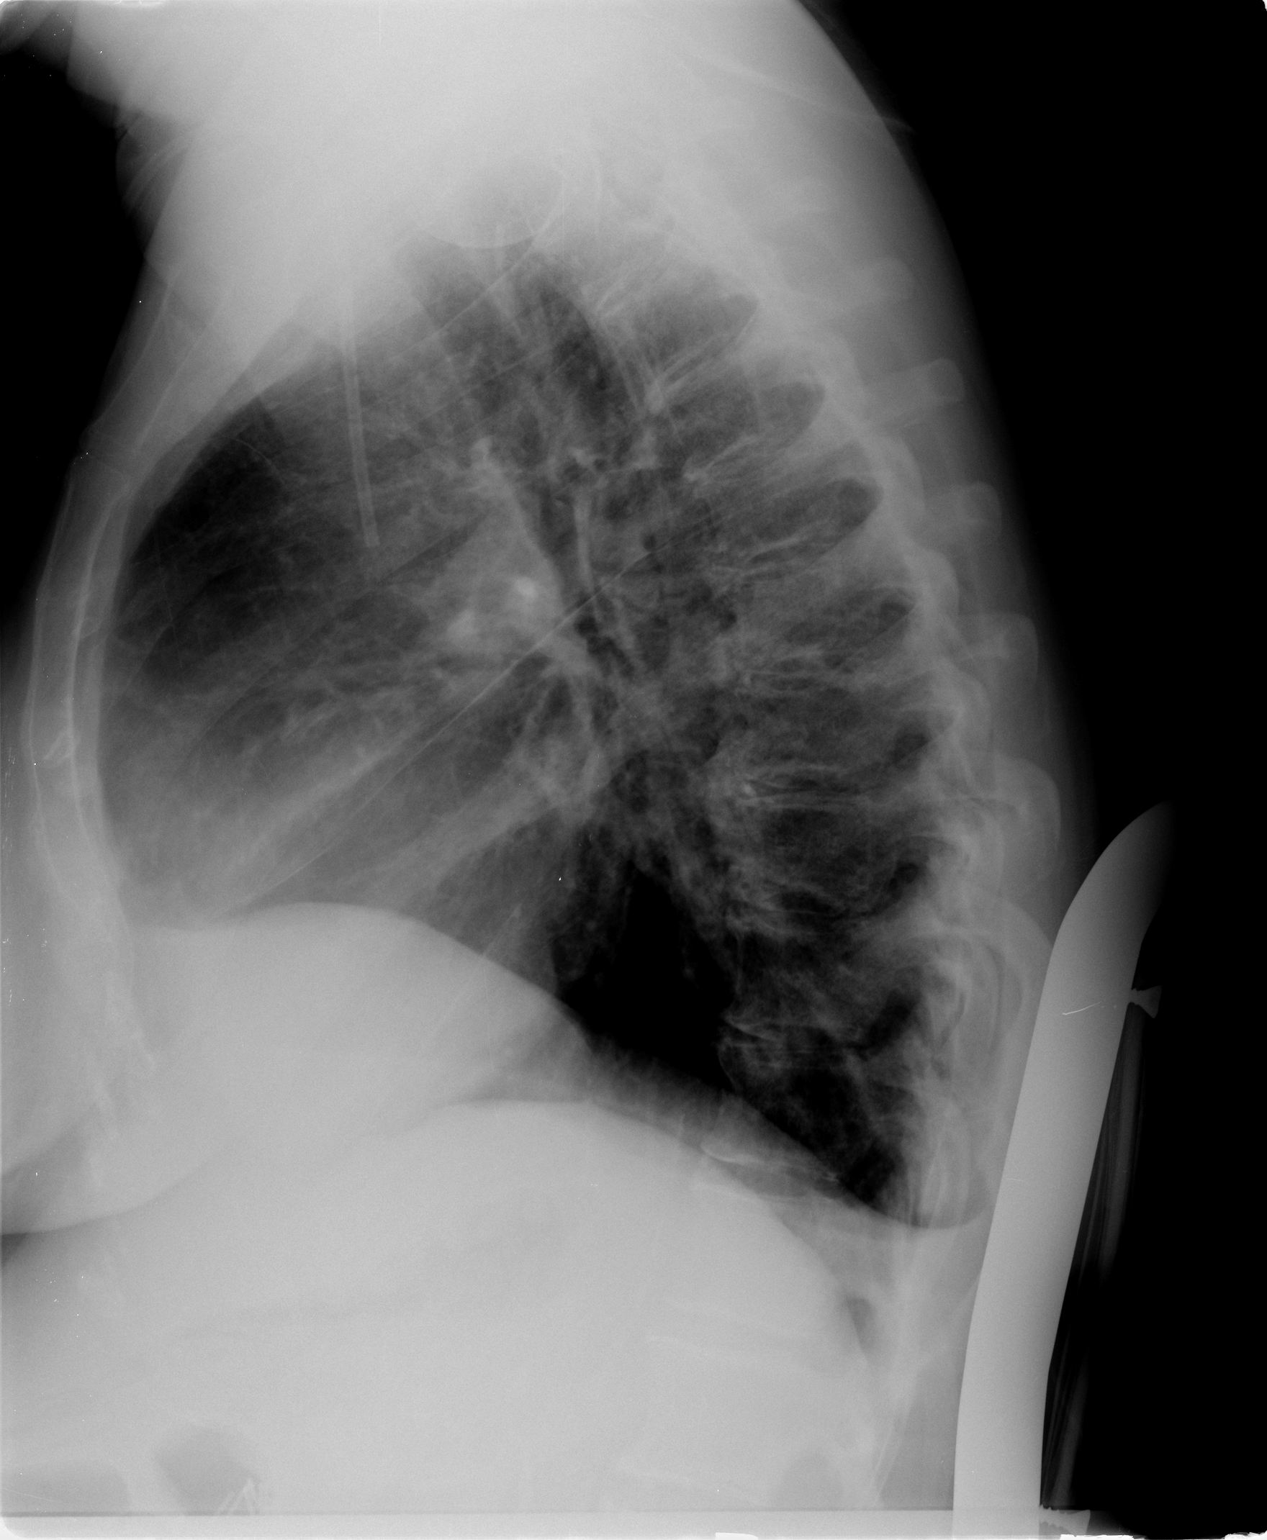

[view not recorded (2 of 2)]
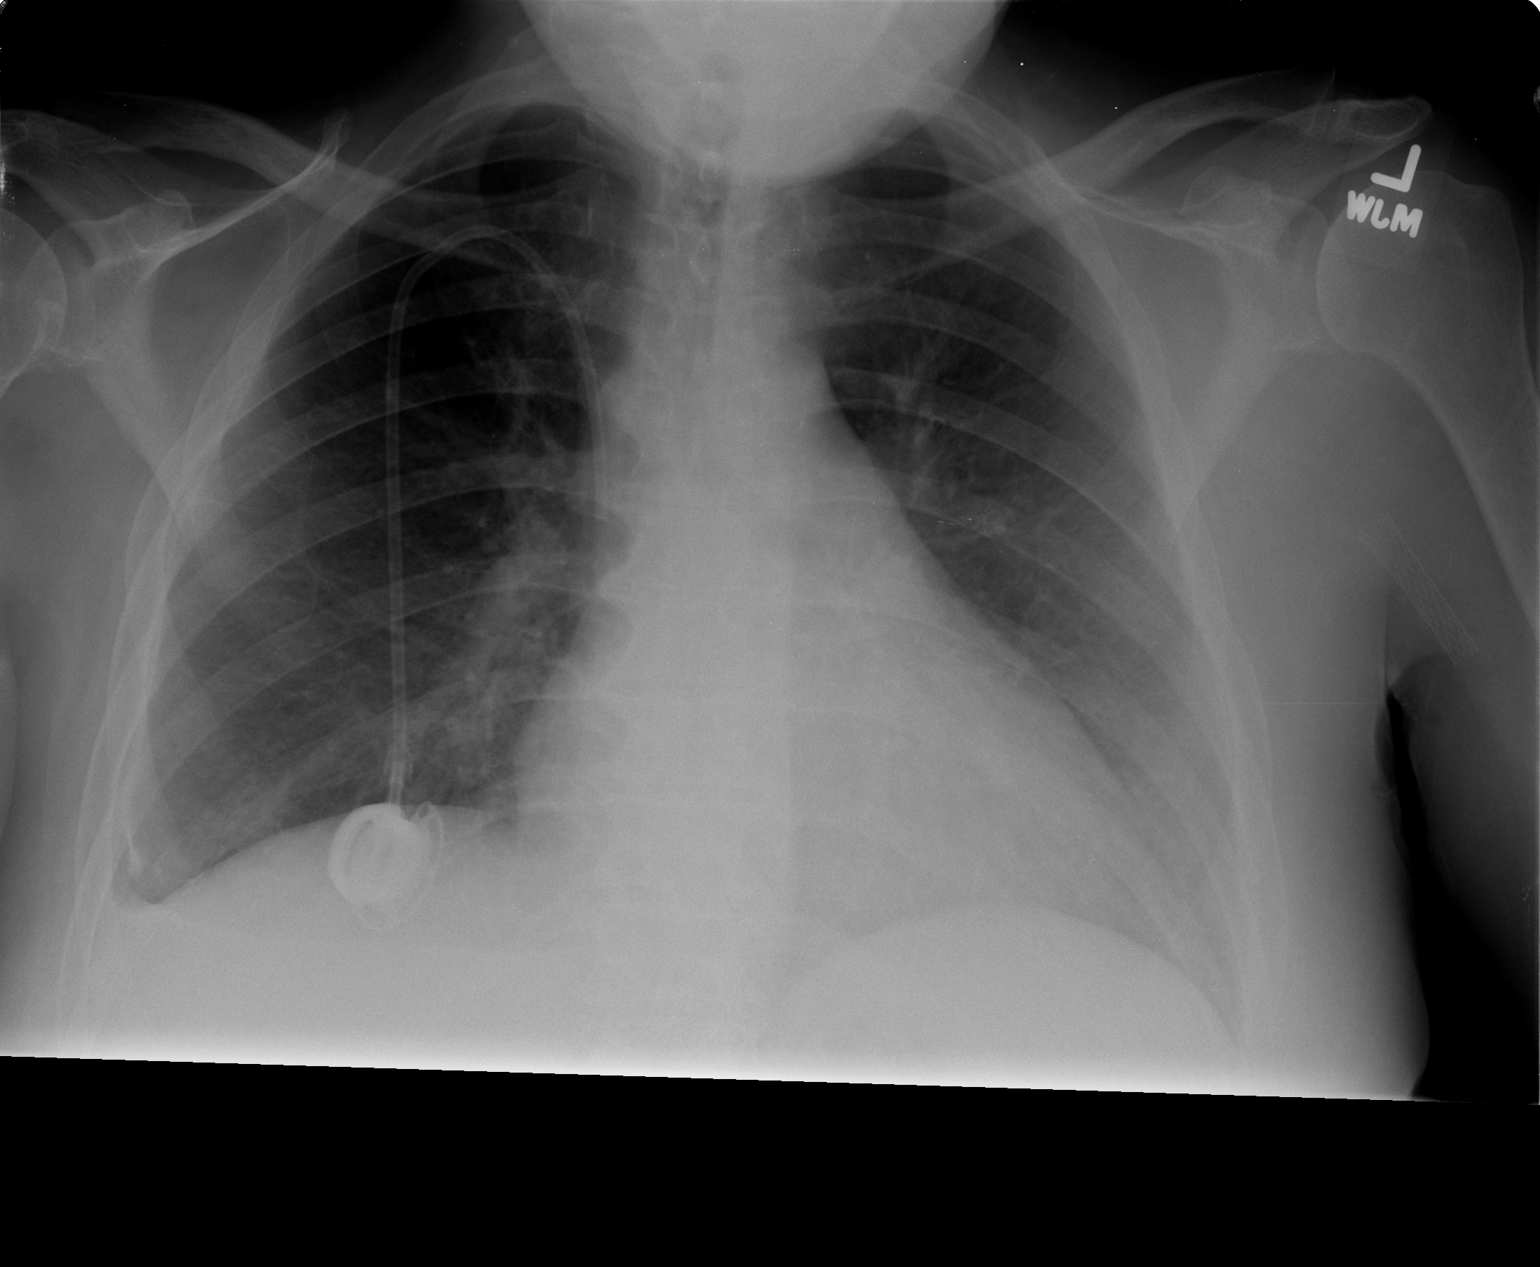

[2 of 2 positions shown; findings below may reference images not displayed]

FINDINGS: Right to subclavian single lumen Port-A-Cath with tip
terminating in the distal superior vena cava.  Lung volumes are
low.  There is a small right-sided pleural effusion, which appears
to have decreased compared to the prior examination 08/20/2011.  No
definite focal airspace consolidation.  No contralateral pleural
effusion.  Pulmonary vasculature is normal.  Borderline
cardiomegaly (unchanged).  Mediastinal contours are unremarkable.
IMPRESSION: 1.  Small right-sided pleural effusion.
2.  Right subclavian single lumen Port-A-Cath with tip in the
distal superior vena cava.

## 2012-11-25 IMAGING — XA IR AV DIALYSIS GRAFT DECLOT
1 series · 13 of 24 positions shown · non-contrast
Comparison: Declot - 11/02/2011

INDICATION: Recurrent clotted graft; Poor venous access

   1.    FISTULALYSIS
2.    ANGIOPLASTY OF VENOUS LIMB AND VENOUS ANASTOMOSIS
3.    FLUOROSCOPIC GUIDED STENT PLACEMENT
4.    ULTRASOUND GUIDANCE FOR GRAFT ACCESS
5.    ULTRASOUND GUIDED VENIPUCTURE BY NEI
TECHNIQUE: Informed written consent was obtained from the patient's sister
after a discussion of the risks, benefits and alternatives to
treatment.  Questions regarding the procedure were encouraged and
answered.  A timeout was performed prior to the initiation of the
procedure.

[Series 1: run · 13 of 103 slices shown]
[im 1/103]
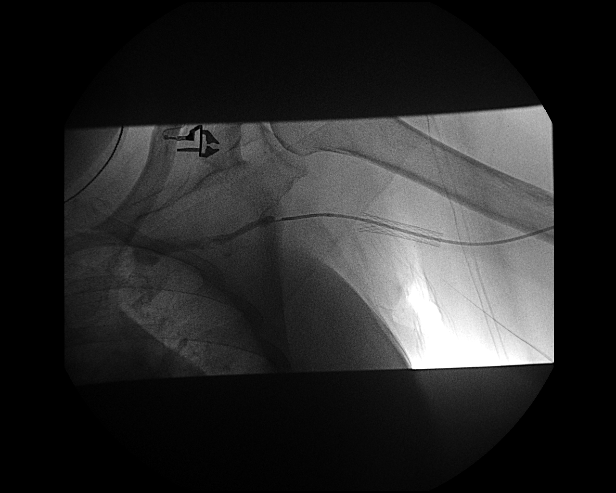
[im 9/103]
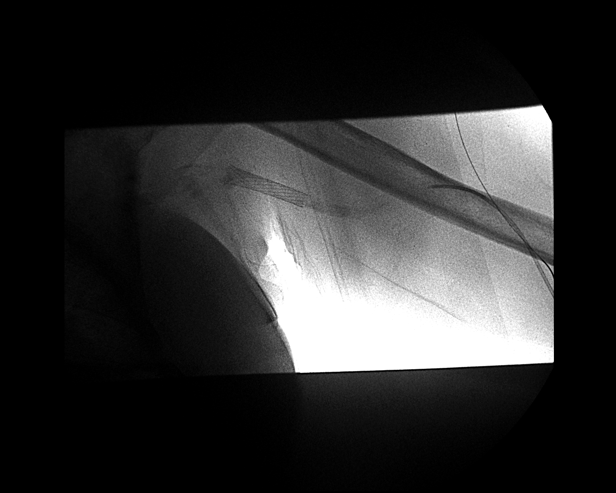
[im 18/103]
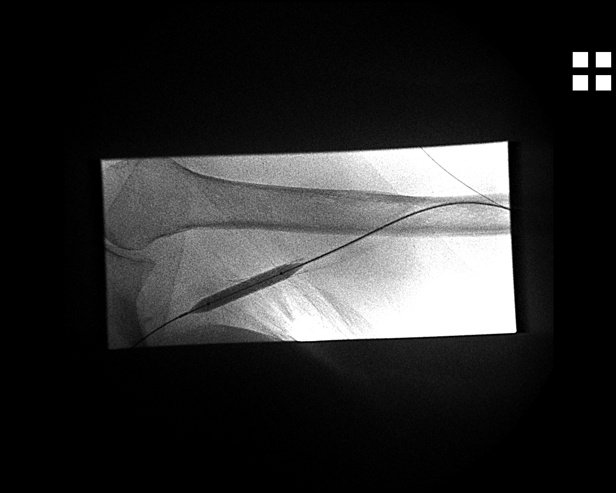
[im 27/103]
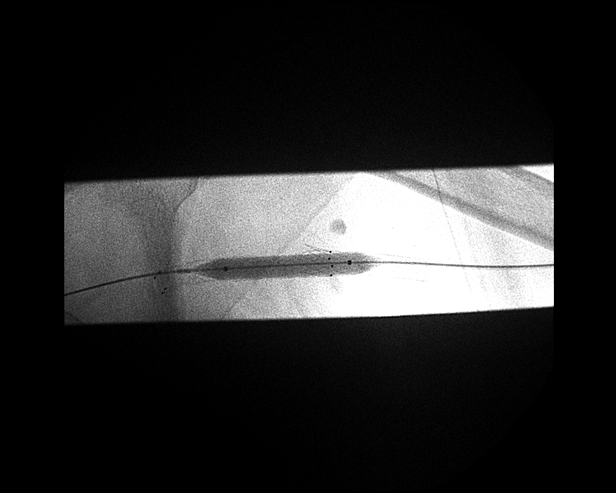
[im 36/103]
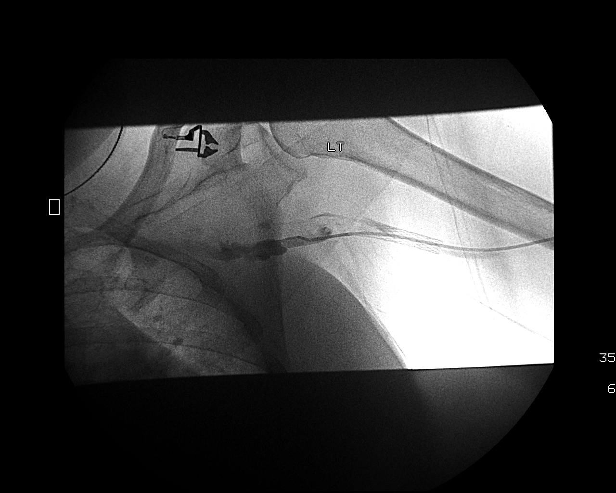
[im 45/103]
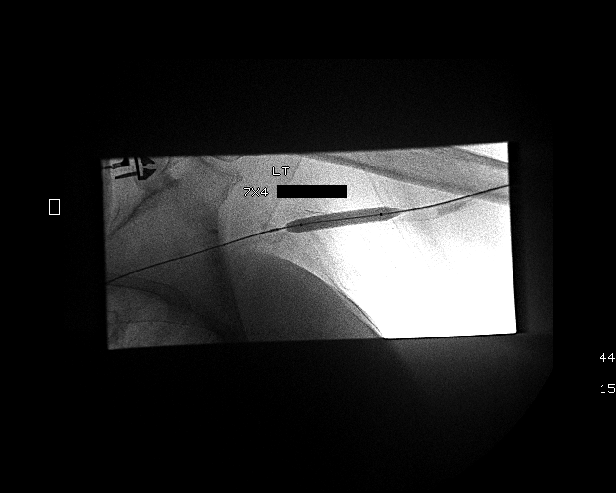
[im 54/103]
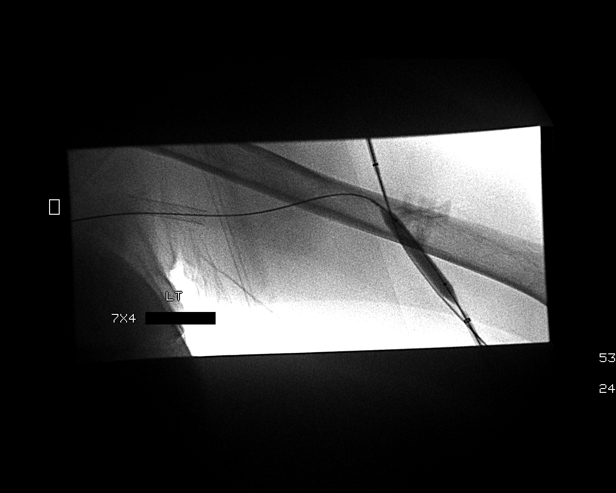
[im 58/103]
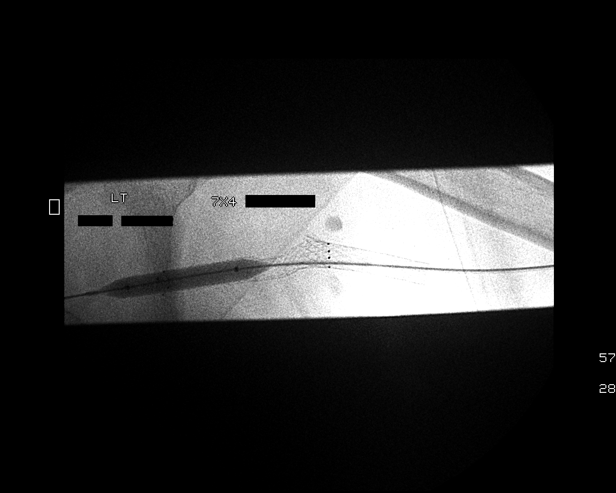
[im 67/103]
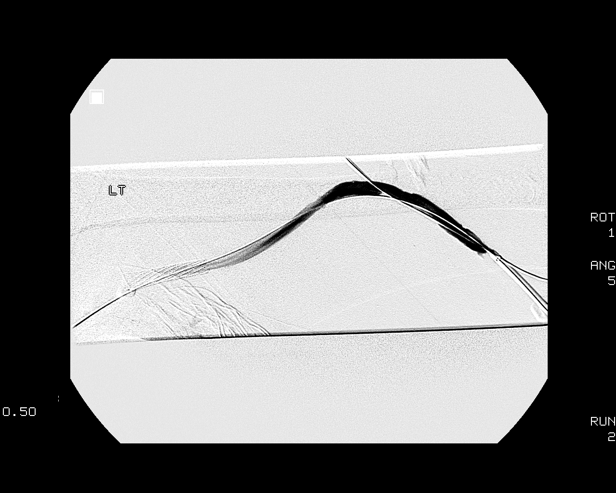
[im 76/103]
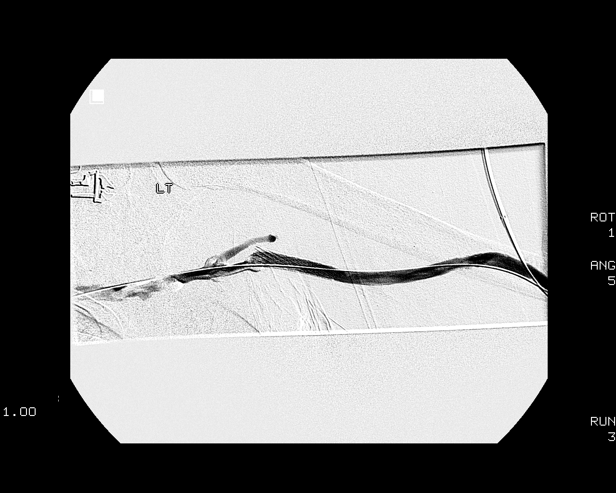
[im 85/103]
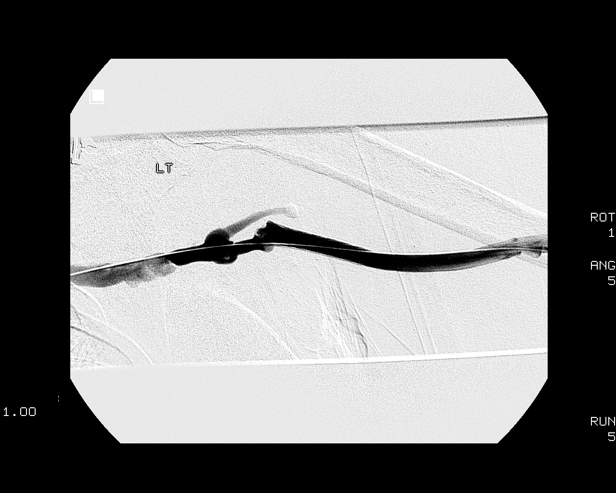
[im 94/103]
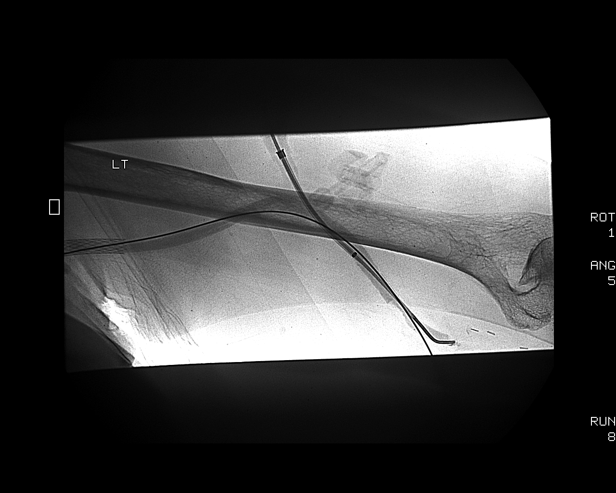
[im 103/103]
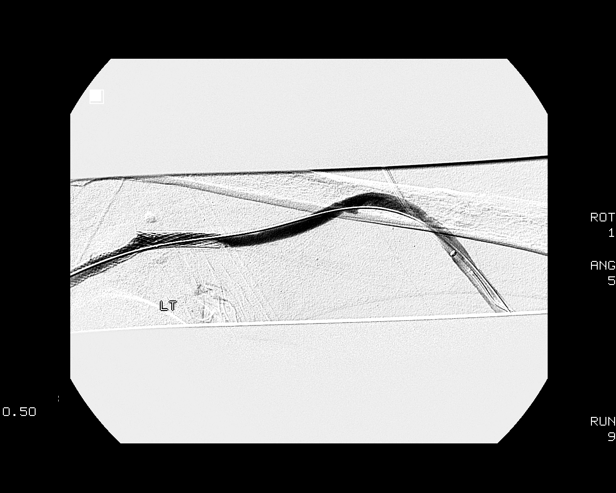

[13 of 24 positions shown; findings below may reference images not displayed]

Medications: Heparin 7555 units IV; TPA 3 mg into graft.}

Contrast: 75 ml Mmnipaque-Y00

Fluoroscopy Time: 22.1 minutes.

Complications: None immediate
Initial attempts by nursing to obtain peripheral intravenous access
proved unsuccessful.  As such, the right upper arm was prepped with
chlorhexidine in a sterile fashion, and a sterile drape was applied
covering the operative field.  Local anesthesia was provided with
1% lidocaine.

Under direct ultrasound guidance, the right basilic vein was
accessed with a micropuncture kit after the overlying soft tissues
were anesthetized with 1% lidocaine.  An ultrasound image was saved
for documentation purposes.  The micropuncture sheath easily
aspirated and flushed and was secured in place.  A dressing was
placed.  The patient tolerated the procedure well without immediate
post procedural complication.

Attention was paid towards the left upper arm dialysis graft
declot.  On physical examination, the existing left upper arm
dialysis graft was negative for palpable pulse or thrill.  The skin
overlying the graft was prepped and draped in the usual sterile
fashion, and a sterile drape was applied covering the operative
field.  Maximum barrier sterile technique with sterile gowns and
gloves were used for the procedure.

Under ultrasound guidance, the dialysis graft was accessed directed
towards the venous anastomosis with a micropuncture kit after the
overlying soft tissues were anesthetized with 1% lidocaine.  An
ultrasound image was saved for documentation purposes.  The
micropuncture sheath was exchange for a 7-French vascular sheath
over a guidewire.  Over a Benson wire, a Kumpe catheter was
advanced centrally and a central venogram was performed.  Pullback
venogram was performed with the Kumpe catheter.  Heparin was
administered systemically and TPA was administered via the Kumpe
catheter throughout near the entirety of the venous limb.

A regular glide wire was utilized to manipulate the Kumpe catheter
through the dominant venous outflow in the brachial vein.  The
venous anastomosis and majority of the venous limb was
angioplastied with a 7 mm x 4 cm Conquest balloon.  An additional
access was obtained directed towards the arterial anastomoses with
a micropuncture kit after the overlying soft tissues anesthetized
with 1% lidocaine.  This allowed for placement of a 6-French
vascular sheath.  The graft was thrombectomized with several rounds
of push-pull mechanical thrombectomy with an occlusion balloon.
Flow was restored to the graft as evidenced by blood return from
the side arm of the vascular sheath.  Shuntograms were performed.

An abrupt angulation of the central aspect of the previously placed
peri venous anastomotic stent was angioplastied in multiple
stations to 7 mm diameter, however a residual moderate stenosis
persisted.  As such, an overlapping 8 mm x 60 mm Zilver uncovered
stent was deployed into the dominant venous outflow of the upper
arm graft.  The stent was subsequently angioplastied multiple
stations to 7 mm diameter.  A completion shuntogram demonstrates
excellent angiographic result with brisk flow through the venous
anastomosis and brachial vein.

A small amount of residual mural thrombus of the arterial limb was
cleared with several additional rounds of pull thrombectomy with
the occlusion balloon.  Completion shuntogram demonstrates
excellent angiographic result.

At this point, the procedure was terminated.  All wires, catheters
and sheaths were removed from the patient.  Heostasis was achieved
at both access sites with deployment of a swizzle sutures which
will be removed at the patient's next dialysis session. Dressings
were placed.  The patient tolerated the procedure without immediate
postprocedural complication.
FINDINGS: The existing left upper AV graft is thrombosed.  The venous
anastomosis and near the entirety of the venous limb was
angioplastied to 7 mm diameter.  The graft was successfully
thrombectomized using mechanical and pharmacologic means as above.

The central aspect of the previously placed peri-venous anastomotic
stent (deployed during the preceding declot due to extravasation at
the venous anastomosis) terminates at a bifurcation of the central
brachial vein and appears to side wall against the wall and valve
of the dominant brachial vein outflow (as was demonstrated on
multiple pullback venograms).  Despite multiple rounds of repeat
balloon angioplasty to 7 mm diameter, a residual moderate
stenosis/angulation persists.  As such an overlapping 8 mm x 60 mm
Zilver uncovered stent was deployed into the venous outflow of the
upper arm graft and subsequently angioplastied in multiple stations
to 7 mm diameter.  Completion shuntogram demonstrates excellent
angiographic result with brisk flow through the venous anastomosis
and into the brachial vein.

Shuntogram performed from the native arterial system demonstrates
minimal amount of mural thrombus within the arterial limb of the
graft which was cleared with several additional rounds of pull
thrombectomy.  Completion shuntogram demonstrates mild sluggish
flow through the graft but is without hemodynamically significant
stenosis.

The central venous system is widely patent.
IMPRESSION: 1.  Technically successful left upper arm AV graft thrombolysis.

2.  Successful angioplasty of the venous anastomosis and majority
of the venous limb to 7 mm diameter.  The central aspect of the
previously placed peri-venous anastomotic stent terminates at a
bifurcation of the central brachial vein and appears to side wall
against the wall and valve of the dominant brachial vein outflow.
This was ultimately treated with deployment an overlapping 8 mm x
60 mm Zilver uncovered stent into the main venous outflow.
Completion shuntogram demonstrates excellent angiographic result
with brisk flow through the venous anastomosis and into the
draining brachial vein.

3.  The arterial anastomosis and arterial limb are widely patent.

4.  The central venous system is widely patent.

5.  Successful ultrasound guided placement of a right basilic vein
approach micropuncture sheath for temporary venous access.

Access Management:

This access remains amendable to percutaneous intervention as
clinically indicated.

## 2012-11-30 IMAGING — XA IR SHUNTOGRAM/ FISTULAGRAM
1 series · 12 of 24 positions shown · non-contrast
Comparison: none

CLINICAL HISTORY: 41-year-old with end-stage renal disease and
concern for an occluded left upper arm graft.

[Series 1: run · 12 of 46 slices shown]
[im 2/46]
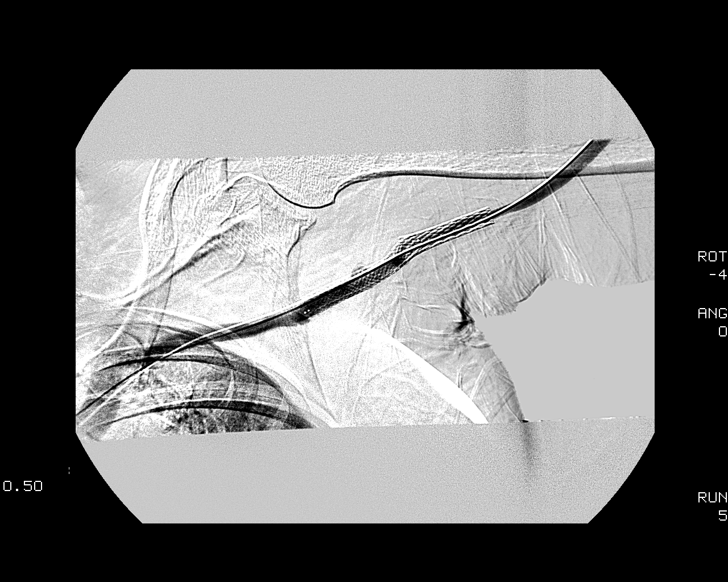
[im 6/46]
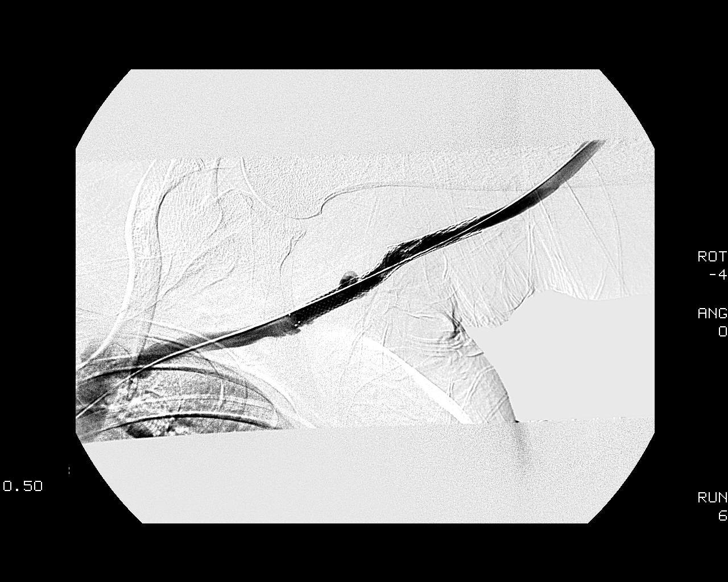
[im 10/46]
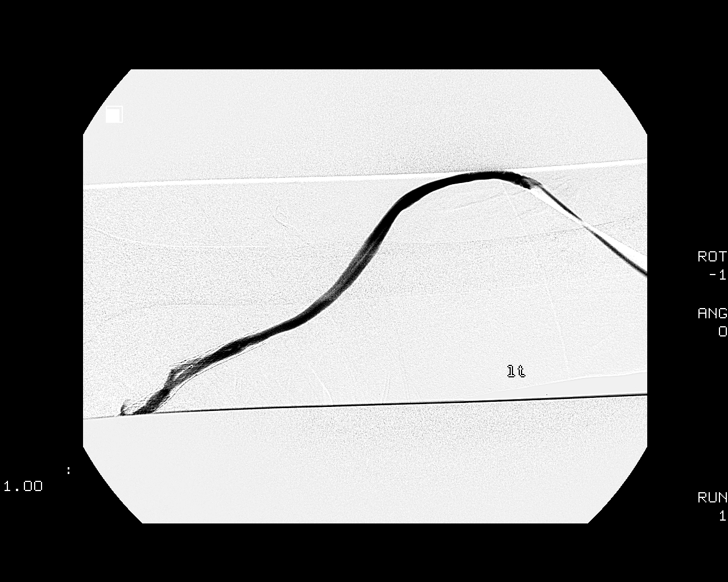
[im 14/46]
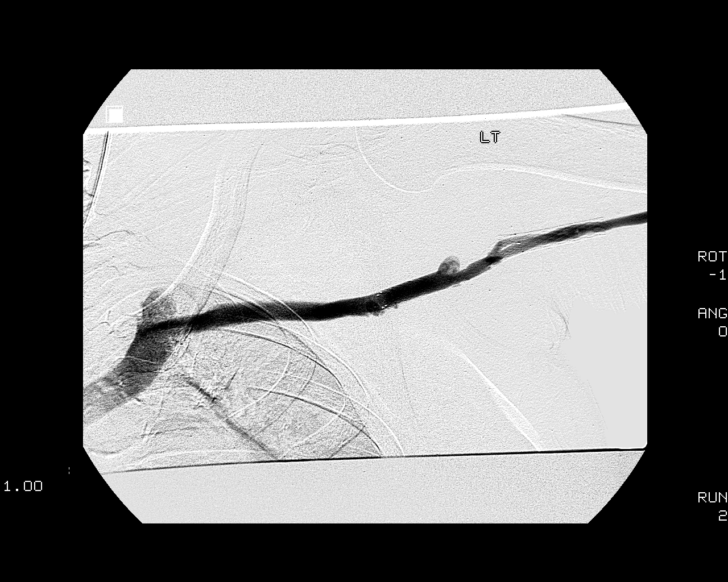
[im 18/46]
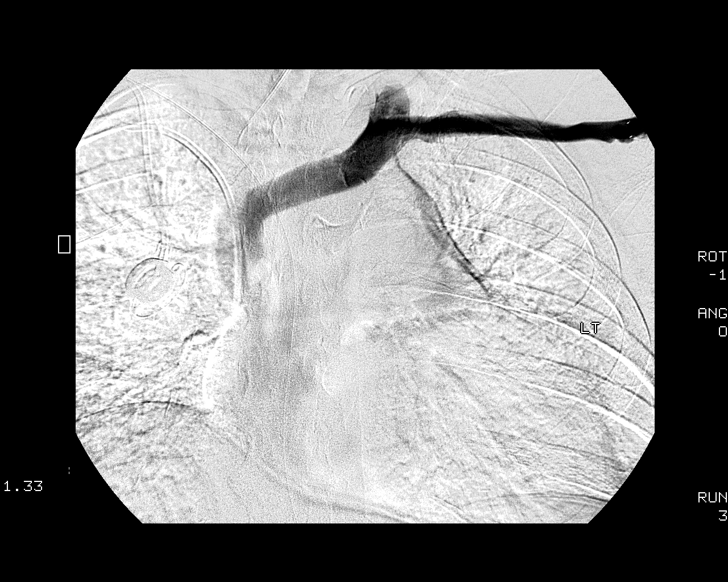
[im 22/46]
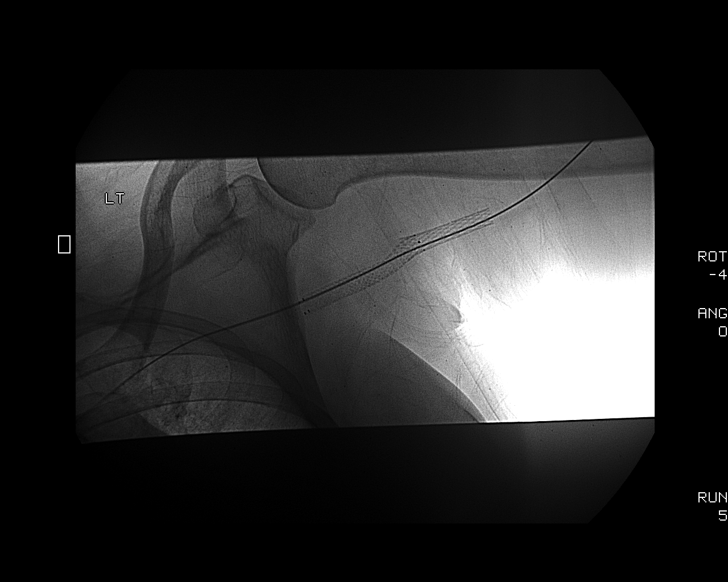
[im 26/46]
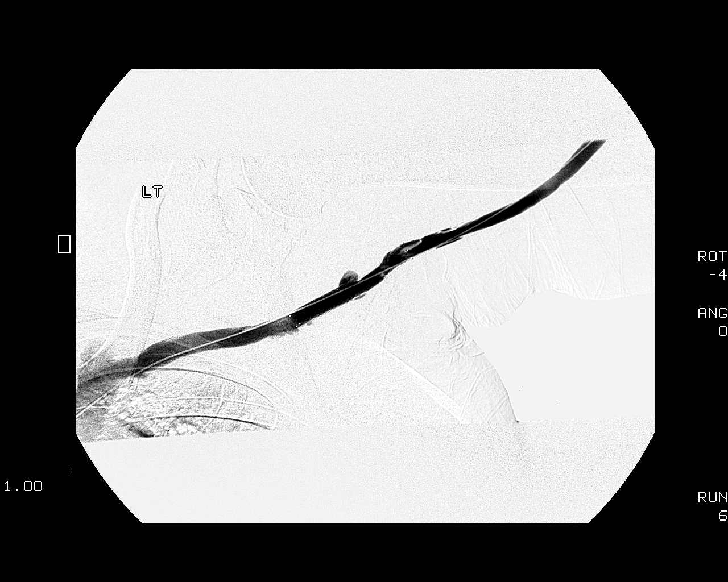
[im 30/46]
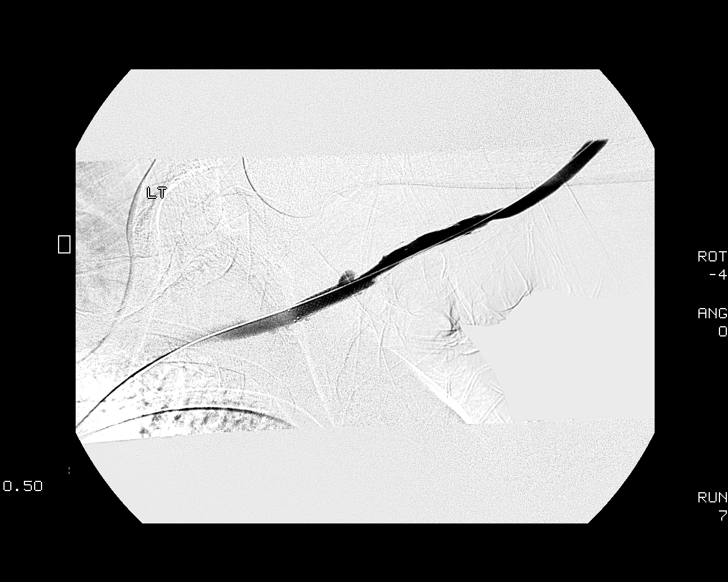
[im 34/46]
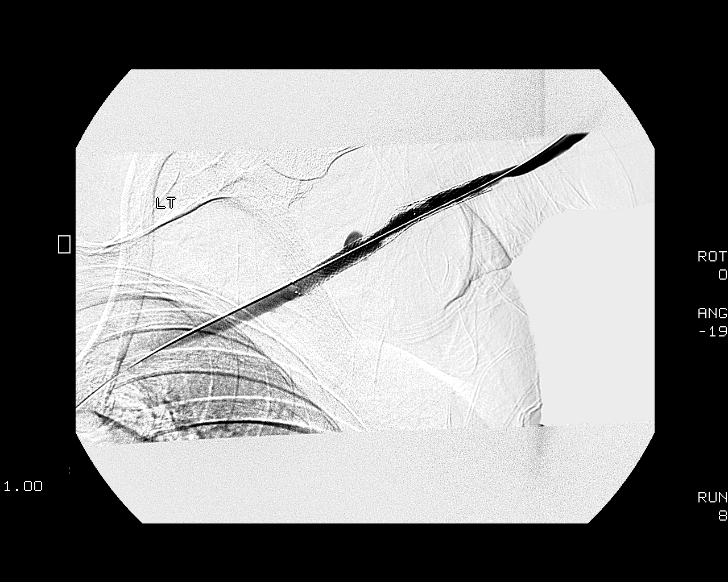
[im 38/46]
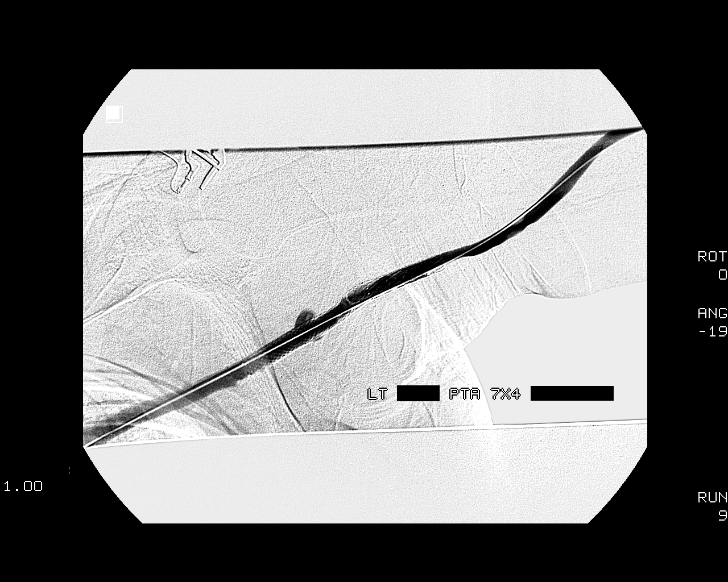
[im 42/46]
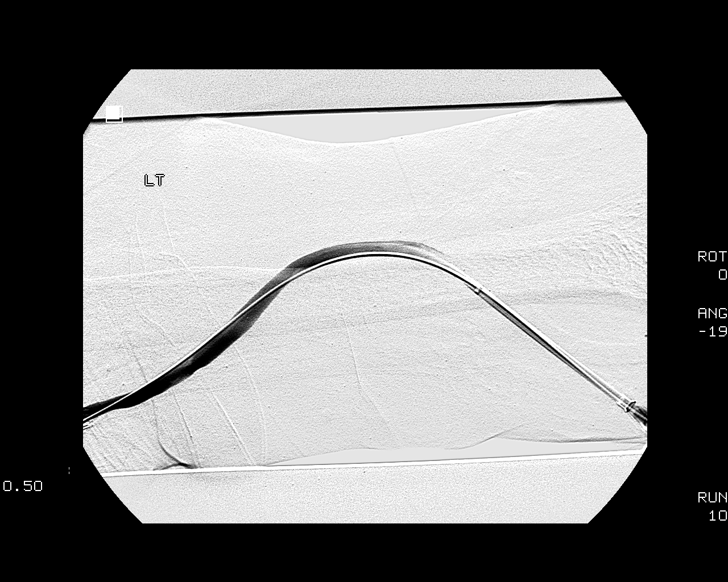
[im 46/46]
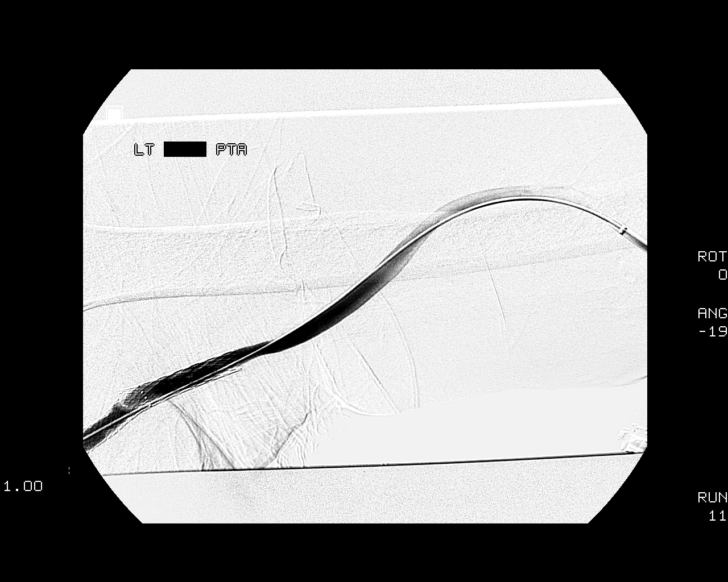

[12 of 24 positions shown; findings below may reference images not displayed]

PROCEDURE(S): LEFT UPPER EXTREMITY SHUNTOGRAM; GRAFT/VENOUS PTA;
ULTRASOUND GUIDANCE FOR VASCULAR ACCESS

Medications:None

 Moderate sedation time:None

Fluoroscopy time: 4.3 minutes

Contrast:  80 ml Kmnipaque-JGG

Procedure:The procedure was explained to the patient.  The risks
and benefits of the procedure were discussed and the patient's
questions were addressed.  Informed consent was obtained from the
patient. Ultrasound demonstrated that the left upper arm graft was
patent.  Left arm was prepped and draped in a sterile fashion.
Maximal barrier sterile technique was utilized including caps,
mask, sterile gowns, sterile gloves, sterile drape, hand hygiene
and skin antiseptic.  The skin was anesthetized with 1% lidocaine.
21 gauge needle was directed into the graft with ultrasound
guidance.  Micropuncture dilator set was placed.  A series of
shuntogram images were obtained.  The micropuncture catheter was
exchanged for a 6-French vascular sheath over a Bentson wire.  The
left axillary stents were angioplastied with a 7 mm x 40 mm
Conquest balloon.  This area was treated three times and
shuntograms performed after each dilatation.  Reflux shuntogram
images was also obtained.  Vascular sheath was removed with a
pursestring suture.
FINDINGS: The left upper arm straight graft was patent but there was
irregularity and intrastent stenosis involving the overlapping
stents in the left axilla.  There is a Port-A-Cath with tip in the
lower SVC.  Central veins are patent.  Arterial anastomosis is
widely patent.  There was improved flow through the stents
following balloon angioplasty.  No significant intrastent stenosis
at the end of the procedure.
IMPRESSION: Intrastent stenosis involving the overlying stents in
the left axilla.  Successful balloon angioplasty of the intrastent
stenosis.

## 2012-12-10 IMAGING — US IR SHUNTOGRAM/ FISTULAGRAM
1 series · 1 of 1 positions shown · non-contrast
Comparison: none

CLINICAL HISTORY: Left upper extremity graft with decreased access
flows.

[Series 1: ir shuntogram/ fistulagram *left* · 1 of 1 slices shown]
[im 1/1]
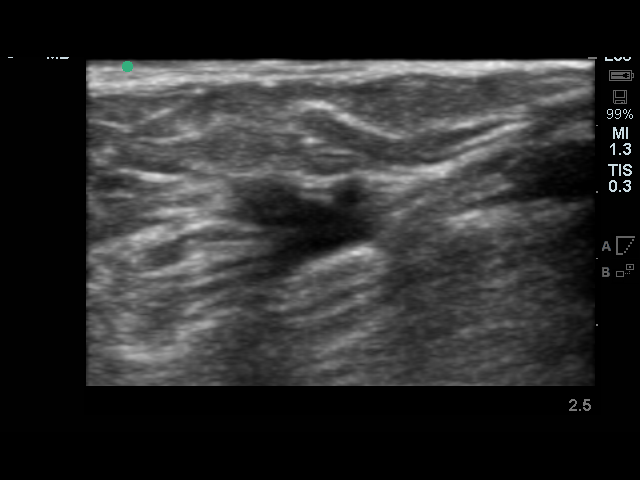

[1 of 1 positions shown; findings below may reference images not displayed]

PROCEDURE(S): LEFT UPPER EXTREMITY SHUNTOGRAM; ULTRASOUND GUIDANCE
FOR VASCULAR ACCESS; VENOUS AND GRAFT ANGIOPLASTY

Medications:The patient was premedicated for a contrast allergy
using the 13 hour prep.  The patient was also given Zofran 4 mg for
nausea during the procedure.

Moderate sedation time:None

Fluoroscopy time: 4.5 minutes

Contrast:  75 ml Zmnipaque-PXX

Procedure:The procedure was explained to the patient.  The risks
and benefits of the procedure were discussed and the patient's
questions were addressed.  Informed consent was obtained from the
patient.  The graft was accessed with an angiocath.  Series of
shuntogram images were obtained. This access was removed.  The arm
was prepped and draped in a sterile fashion.  Maximal barrier
sterile technique was utilized including caps, mask, sterile gowns,
sterile gloves, sterile drape, hand hygiene and skin antiseptic.
Using ultrasound guidance, the top portion of the graft was
accessed towards the arterial anastomosis using ultrasound
guidance.  Micropuncture dilator set was placed and a 6-French
vascular sheath was placed.  A critical stenosis within the graft
just beyond the arterial anastomosis was angioplastied with a 5 mm
x 2 mm balloon.  Follow-up shuntogram images were obtained.  This
area was angioplastied a second time using a 6 mm x 40 mm balloon
and follow-up shuntogram images were obtained. The mid graft was
also angioplastied with a 6 mm balloon.  Shuntogram also
demonstrated intrastent stenosis in the left axilla.  Using
ultrasound guidance, and a micropuncture set was placed towards the
central veins.  6-French sheath was placed.  Bentson wire was
advanced centrally.  The stents were angioplastied with a 7 mm x 4
mm Conquest balloon and follow-up shuntogram images were obtained.
The sheaths were removed with pursestring sutures.
FINDINGS: The left upper arm graft is patent but there was a
critical stenosis in the graft just beyond the arterial
anastomosis.  Narrowing and irregularity in the mid portion of the
graft.  Again noted is mild-moderate intrastent stenosis in the
left axilla.  The critical stenosis in the graft markedly improved
following balloon angioplasty with the 5 mm and 6 mm balloons.
Irregularity in the graft also improved following balloon
dilatation.  Minimal improvement in the intrastent narrowing with
balloon angioplasty.

Complications: None
IMPRESSION: Successful balloon angioplasty of a severe graft
stenosis near the arterial anastomosis.

Balloon angioplasty of the mid graft and intrastent stenosis.

## 2012-12-17 IMAGING — US IR FLUORO GUIDE CV LINE*L*
1 series · 1 of 1 positions shown · non-contrast
Comparison: none

INDICATION: End-stage renal disease, failed left upper arm dialysis
graft, in need intravenous access for hemodialysis; nonfunctioning
and no longer needed right subclavian vein approach porta-catheter

[Series 1: ir fluoro guide cv line*right* · 1 of 1 slices shown]
[im 1/1]
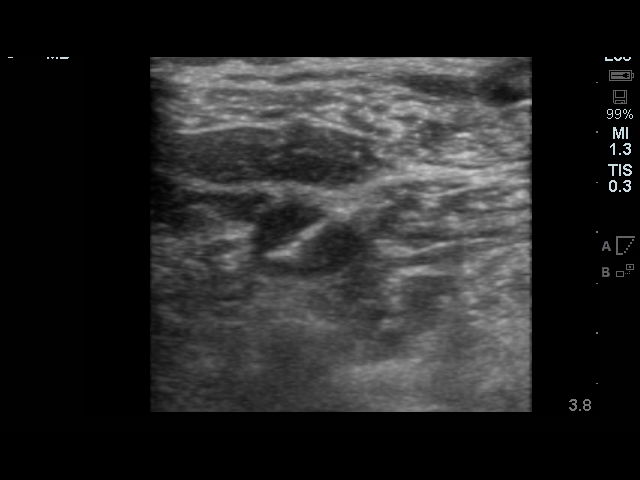

[1 of 1 positions shown; findings below may reference images not displayed]

TUNNELED CENTRAL VENOUS HEMODIALYSIS CATHETER PLACEMENT WITH
ULTRASOUND AND FLUOROSCOPIC GUIDANCE
REMOVAL OF IMPLANTED TUNNELED PORT-A-CATH

Medications: Versed 2 mg IV; Fentanyl 150 mcg IV; Ancef 1 gm IV;
The IV antibiotic was given in an appropriate time interval prior
to skin puncture.

Contrast: None

Total Moderate Sedation Time: 65 minutes.

Fluoroscopy Time: 1.2 minutes.

Complications:  None immediate

Findings / Procedure:

Informed written consent was obtained from the patient's mother
after a discussion of the risks, benefits, and alternatives to
treatment.  Questions regarding the procedure were encouraged and
answered.

Initially, attention was paid towards removal of the right anterior
chest wall port catheter.  The patient was positioned supine on the
fluoroscopy table and the  right chest Port-A-Cath site was prepped
with chlorhexidine.  A sterile gown and gloves were worn during the
procedure.  Local anesthesia was provided with 1% lidocaine with
epinephrine.  A timeout was performed prior to the initiation of
the procedure.

An incision was made overlying the Port-A-Cath with a #15 scalpel.
Utilizing sharp and blunt dissection, the Port-A-Cath was removed
completely.  The pocked was irrigated with sterile saline.  Wound
closure was performed with subcutaneous 3-0 Monocryl, subcuticular
4-0 Vicryl and Dermabond.  A dressing was placed.

Attention was now paid towards placement of a left internal jugular
approach dialysis catheter.  The left internal jugular was selected
for access given the failed left upper arm dialysis graft and
removal of the right anterior chest wall port catheter.  The left
neck and chest were prepped and draped in the usual sterile
fashion.  After creating a small venotomy incision, a micropuncture
kit was utilized to access the right internal jugular vein under
direct, real-time ultrasound guidance after the overlying soft
tissues were anesthetized with 1% lidocaine with epinephrine.
Ultrasound image documentation was performed.  The microwire was
kinked to measure appropriate catheter length.  A stiff glidewire
was advanced to the level of the IVC and the micropuncture sheath
was exchanged for a peel-away sheath.  A hemosplit tunneled
hemodialysis catheter measuring 23 cm from tip to cuff was tunneled
in a retrograde fashion from the anterior chest wall to the
venotomy incision.

The catheter was then placed through the peel-away sheath with tips
ultimately positioned within the superior aspect of the right
atrium.  Final catheter positioning was confirmed and documented
with a spot radiographic image.  The catheter aspirates and flushes
normally.  The catheter was flushed with appropriate volume heparin
dwells.

The catheter exit site was secured with a 0-Prolene retention
suture.  The venotomy incision was closed with an interrupted 4-0
Vicryl, Dermabond and Herrmann.  Dressings were applied.  The
patient tolerated the above procedures well without immediate post
procedural complication.
IMPRESSION: 1.  Successful placement of 23 cm tip to cuff tunneled hemodialysis
catheter via the left internal jugular vein with tips terminating
within the superior aspect of the right atrium.  The catheter is
ready for immediate use.

2.  Successful removal of right anterior chest wall implanted Port-
A-Cath.

## 2012-12-21 IMAGING — CR DG CHEST 2V
2 series · 2 of 2 positions shown · non-contrast
Comparison: 11/09/2011

CLINICAL DATA: Preoperative assessment for aorto bifemoral bypass
grafting and right femoral-popliteal bypass grafting, history
coronary artery disease post MI, hypertension, diabetes, smoking

CHEST - 2 VIEW

[view not recorded (1 of 2)]
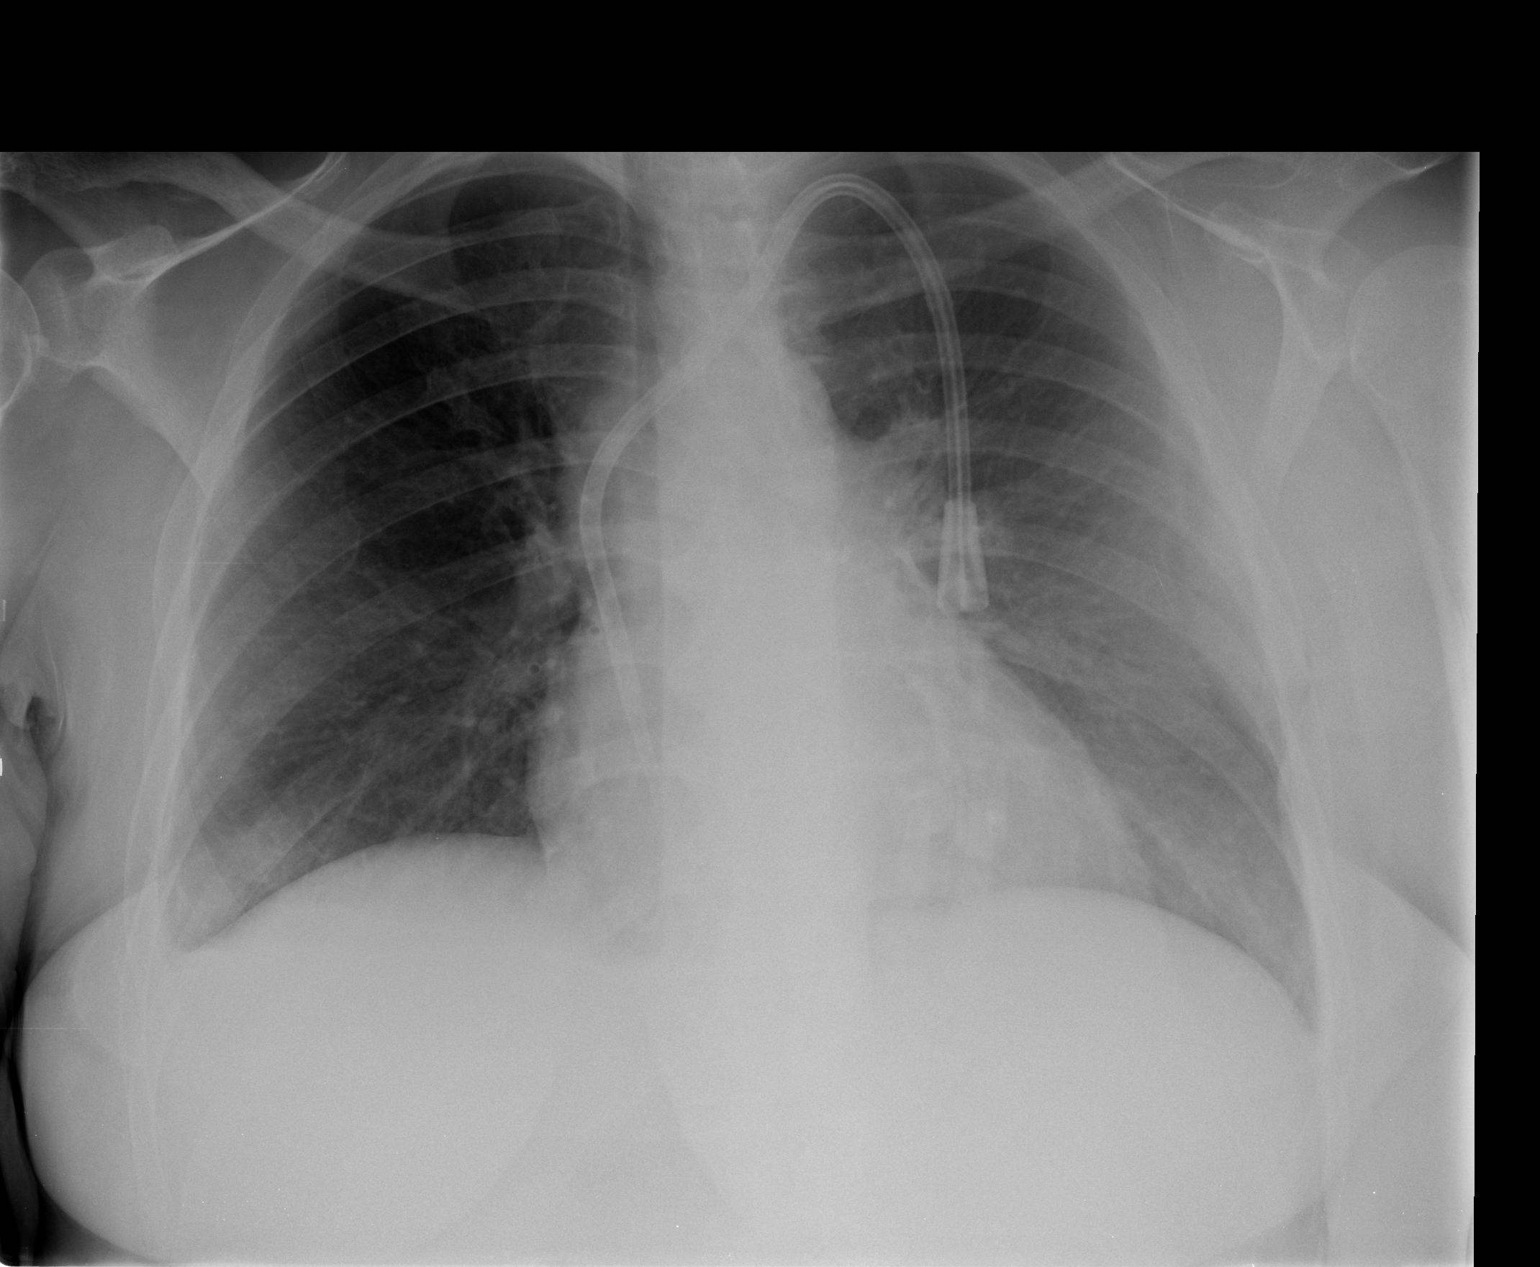

[view not recorded (2 of 2)]
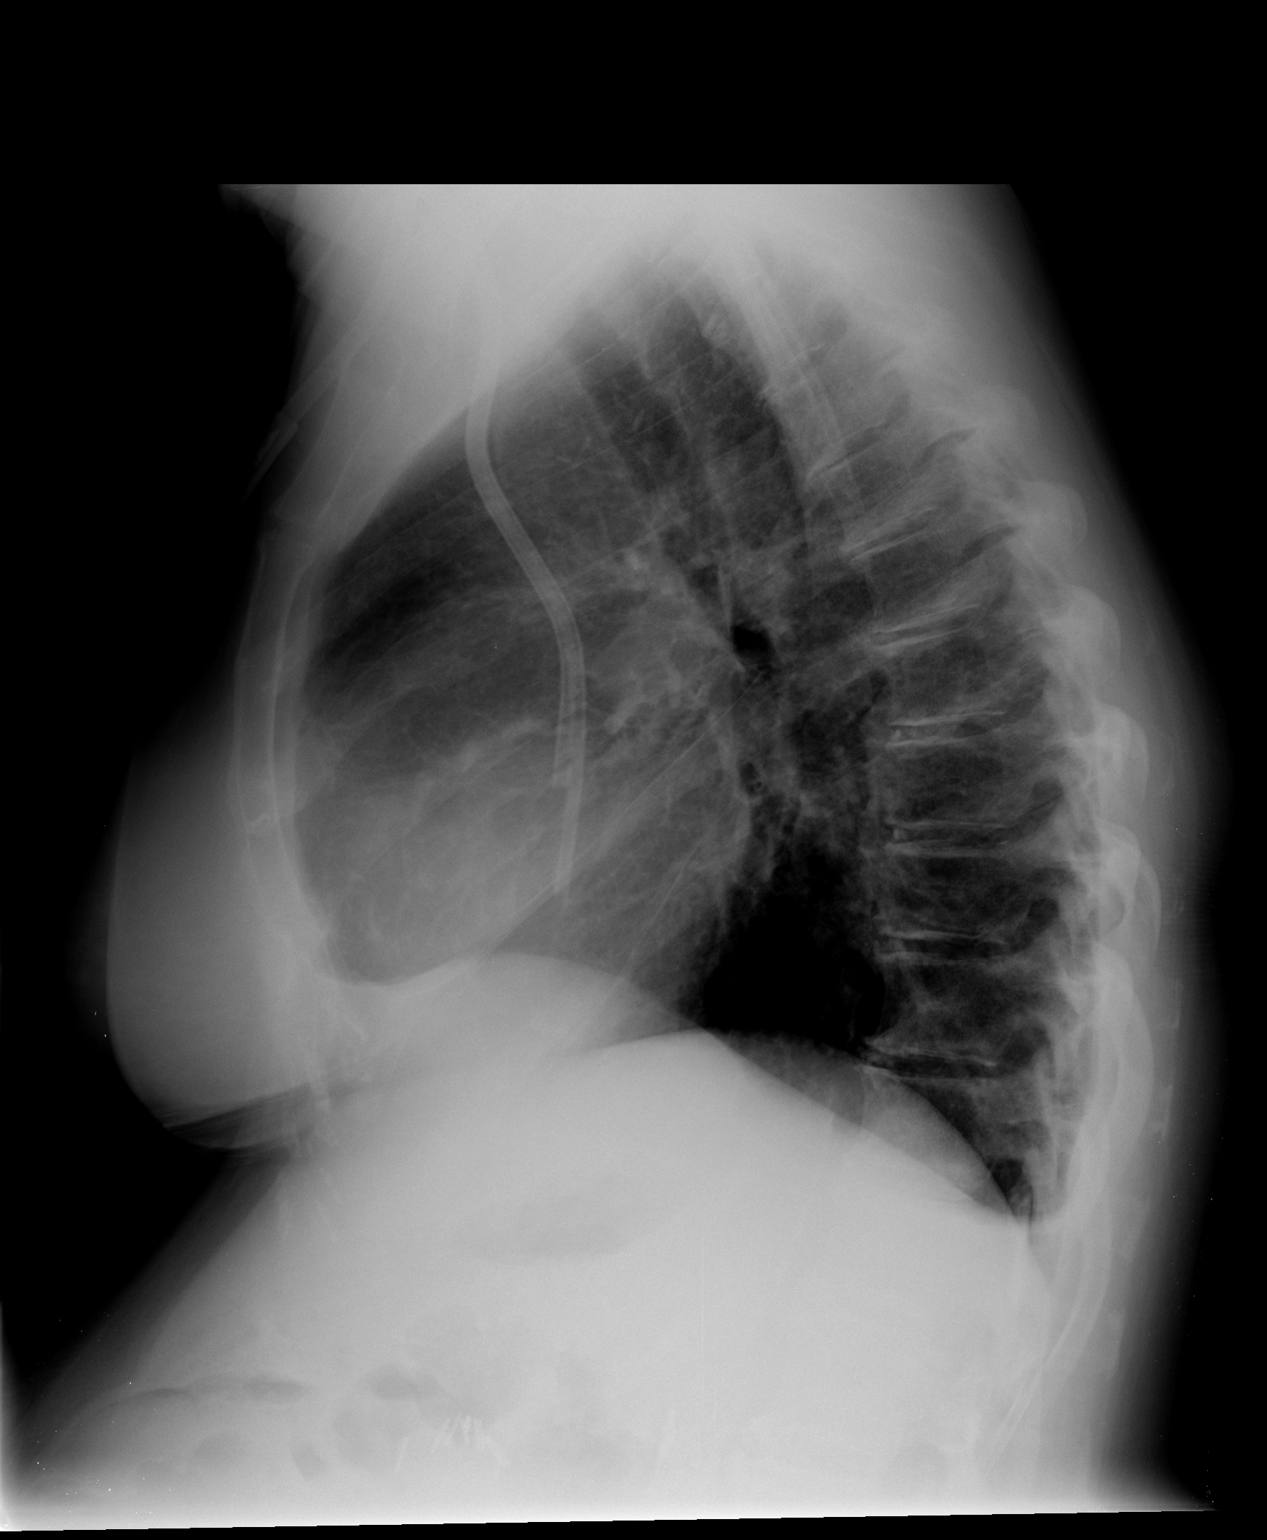

[2 of 2 positions shown; findings below may reference images not displayed]

FINDINGS: Borderline enlargement of cardiac silhouette.
Left jugular central venous catheter, tip projects over right
atrium.
Previously identified right subclavian Port-A-Cath no longer seen.
Mediastinal contours and pulmonary vascularity normal.
Chronic blunting of right costophrenic angle by small effusion.
Peribronchial thickening without infiltrate or pleural effusion.
No pneumothorax.
No acute osseous findings.
IMPRESSION: Bronchitic changes with chronic small right pleural effusion.
Borderline enlargement of cardiac silhouette.

## 2012-12-25 IMAGING — CR DG CHEST 1V PORT
1 series · 1 of 1 positions shown · non-contrast
Comparison: 12/24/2011 and earlier.

CLINICAL DATA: 41-year-old female status post line placement.

PORTABLE CHEST - 1 VIEW

[view not recorded]
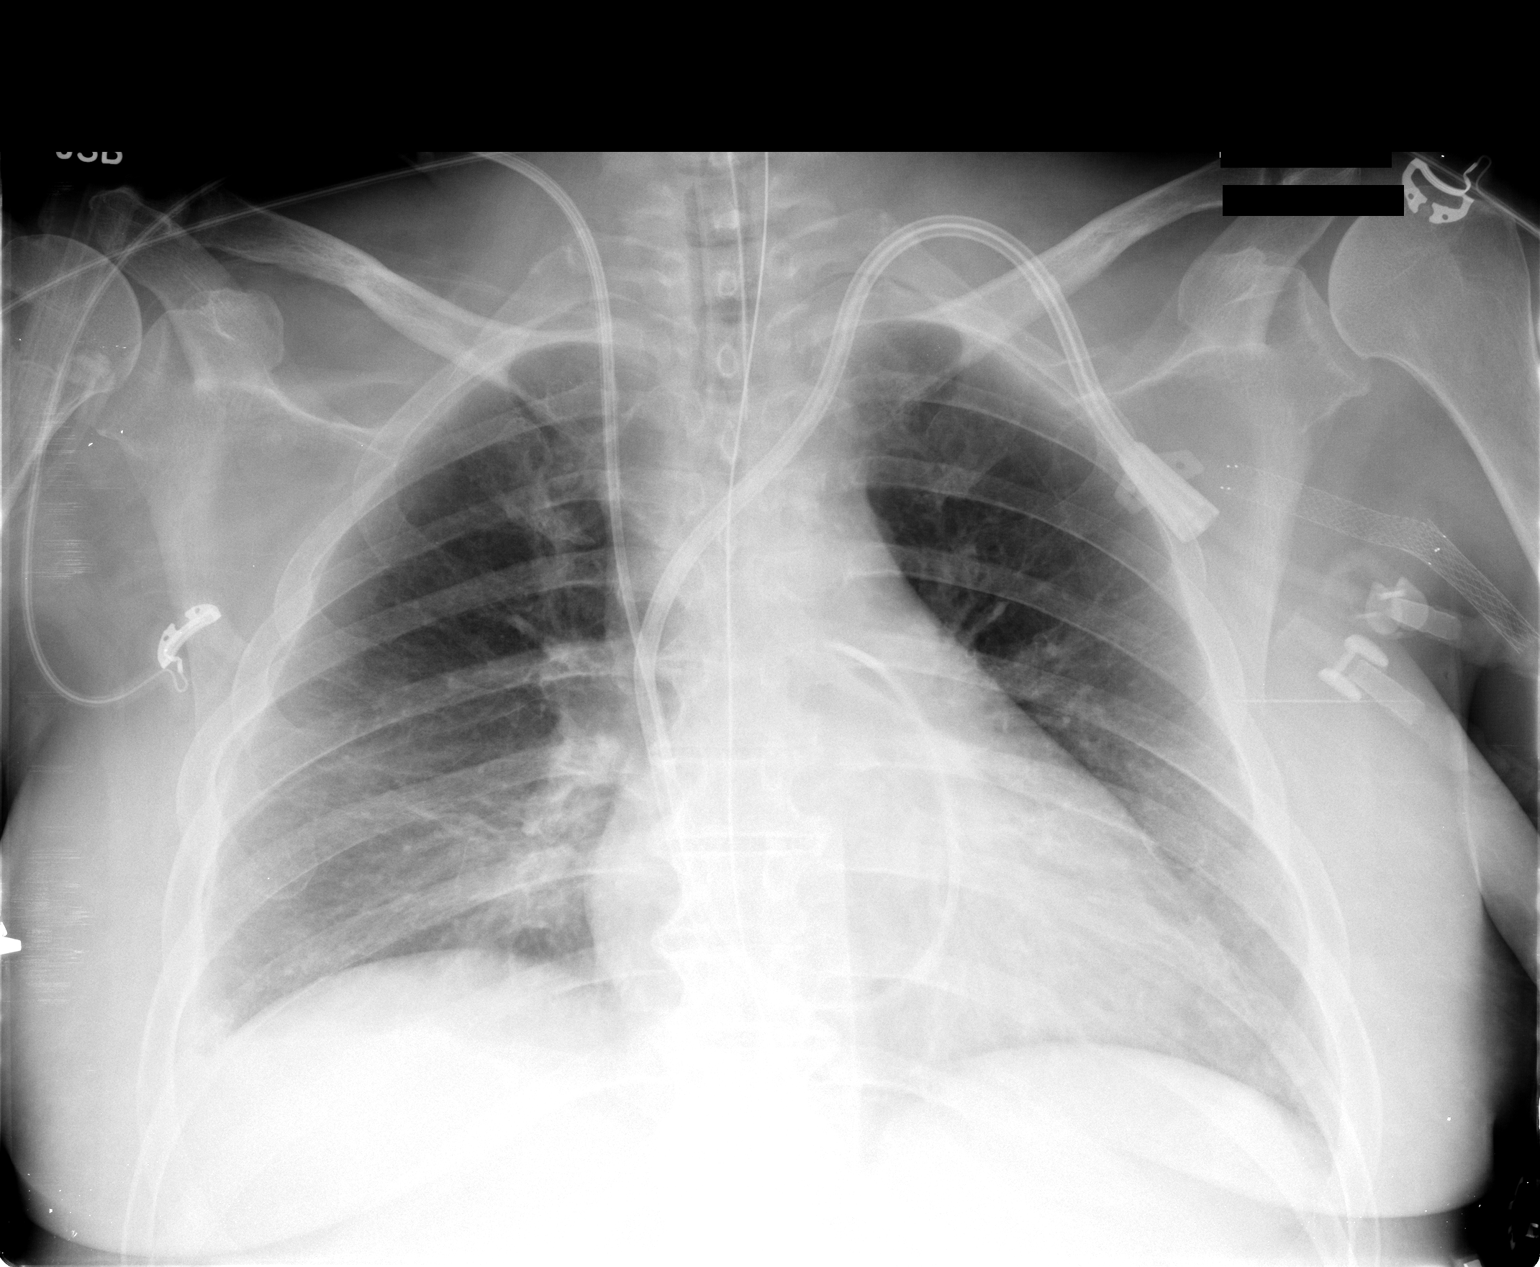

[1 of 1 positions shown; findings below may reference images not displayed]

FINDINGS: AP portable semi upright view 5459 hours.
Endotracheal tube tip below the clavicles.  Enteric tube placed,
courses to the abdomen and tip not included.  Right IJ approach
Swan-Ganz catheter placed, tip at the level of the main pulmonary
artery.

Stable preexisting left IJ dual lumen catheter.  Stable left
axillary metal vascular stent.  Slightly lower lung volumes.  Mild
increased perihilar crowding.  No pneumothorax, pulmonary edema or
confluent opacity.  Probable continued small right effusion.
IMPRESSION: 1.  Lines and tubes appear appropriately placed with positions
detailed above. No pneumothorax.
2.  Continued small right effusion.  Mild increased perihilar
atelectasis.

## 2012-12-26 IMAGING — CR DG CHEST 1V PORT
1 series · 1 of 1 positions shown · non-contrast
Comparison: 12/28/2011.

CLINICAL DATA: Status post thoracic surgery.

PORTABLE CHEST - 1 VIEW

[view not recorded]
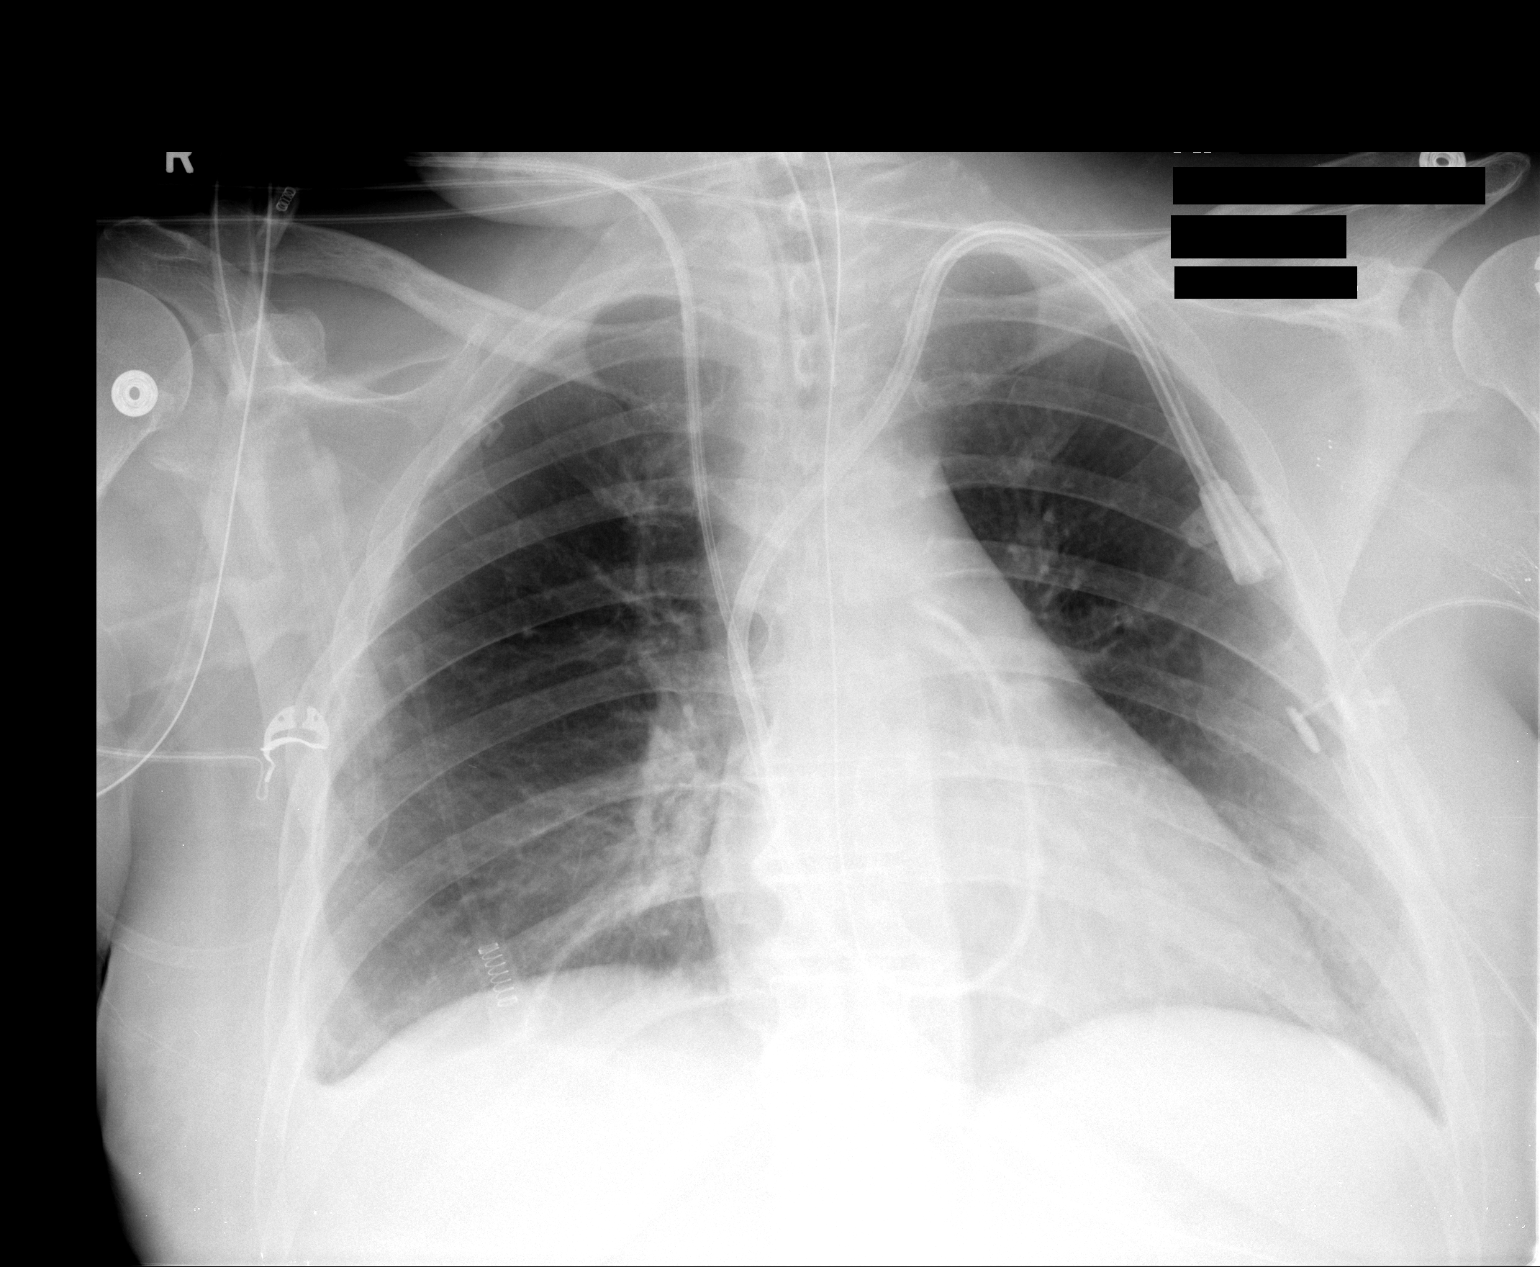

[1 of 1 positions shown; findings below may reference images not displayed]

FINDINGS: The support apparatus is stable.  No complicating
features.  The lungs demonstrate improved aeration.  A small right
effusion persists.
IMPRESSION: 1.  Stable support apparatus.
2.  Small right pleural effusion and minimal streaky basilar
atelectasis.

## 2012-12-28 IMAGING — CR DG CHEST 1V PORT
2 series · 2 of 2 positions shown · non-contrast
Comparison: Chest radiograph 12/29/2011

CLINICAL DATA: Congestion

PORTABLE CHEST - 1 VIEW

[AP (1 of 2)]
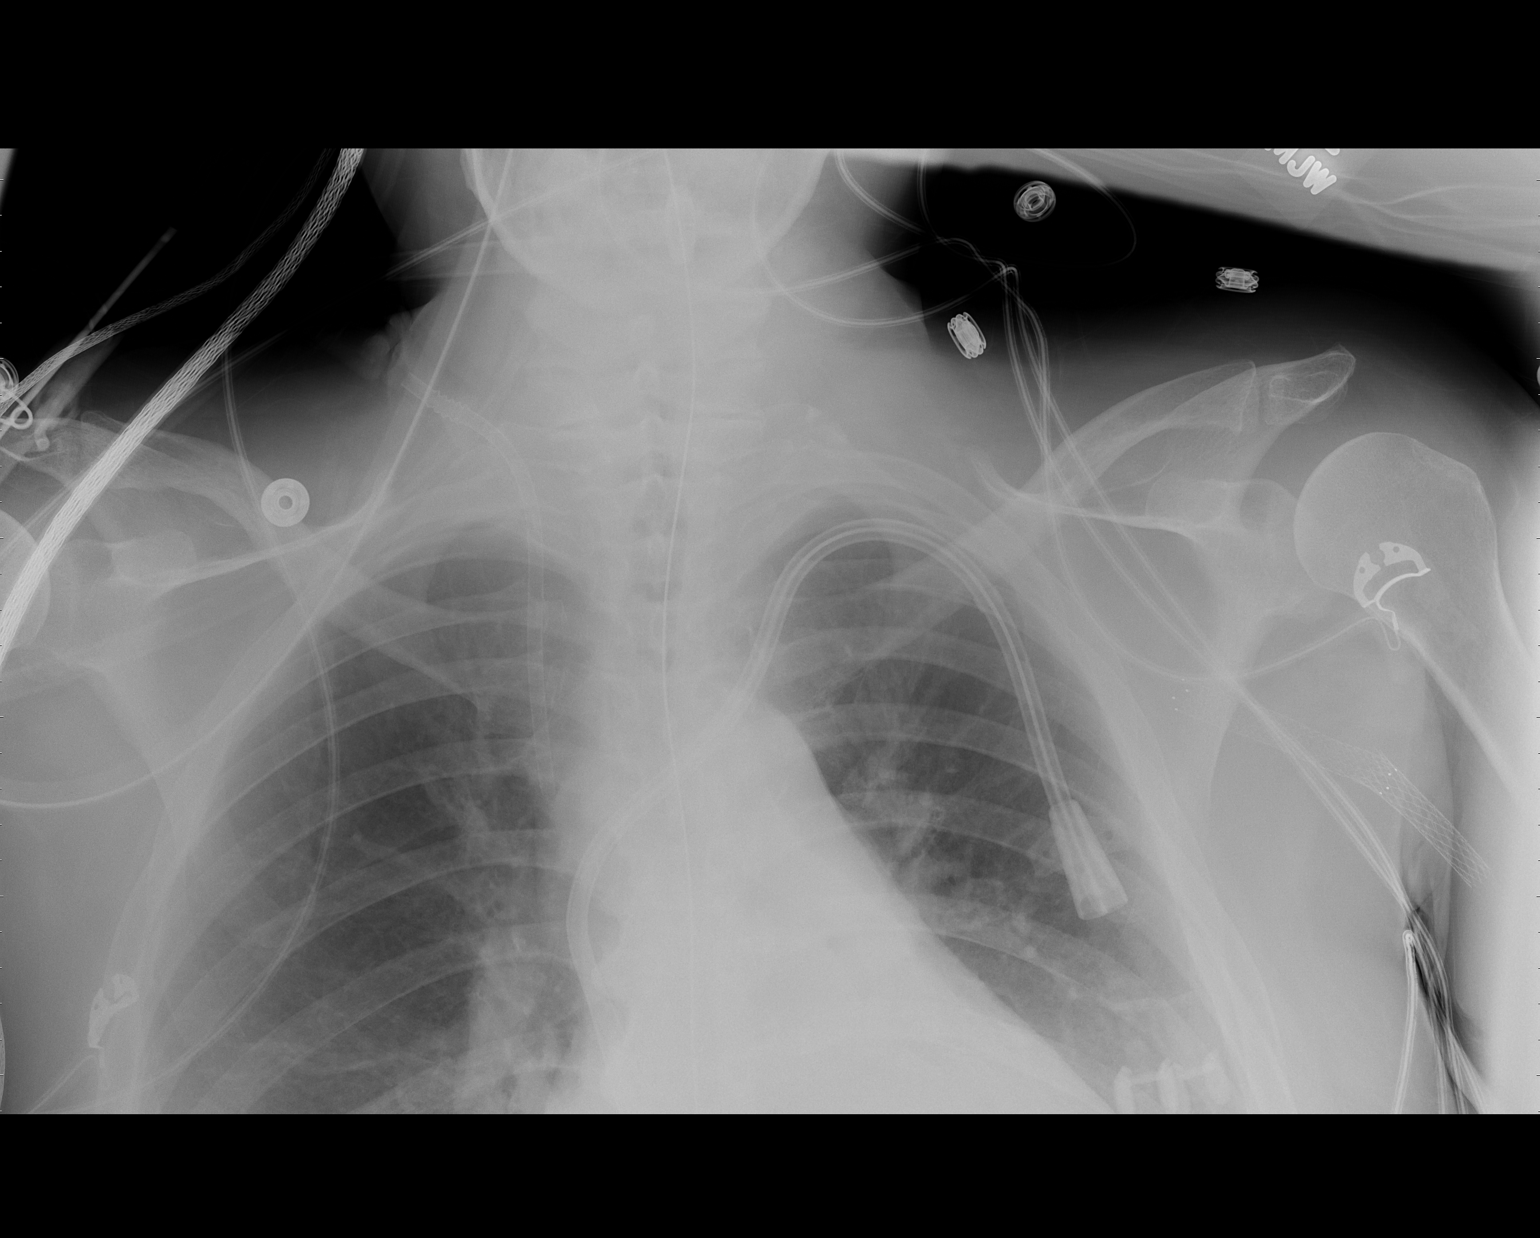

[AP (2 of 2)]
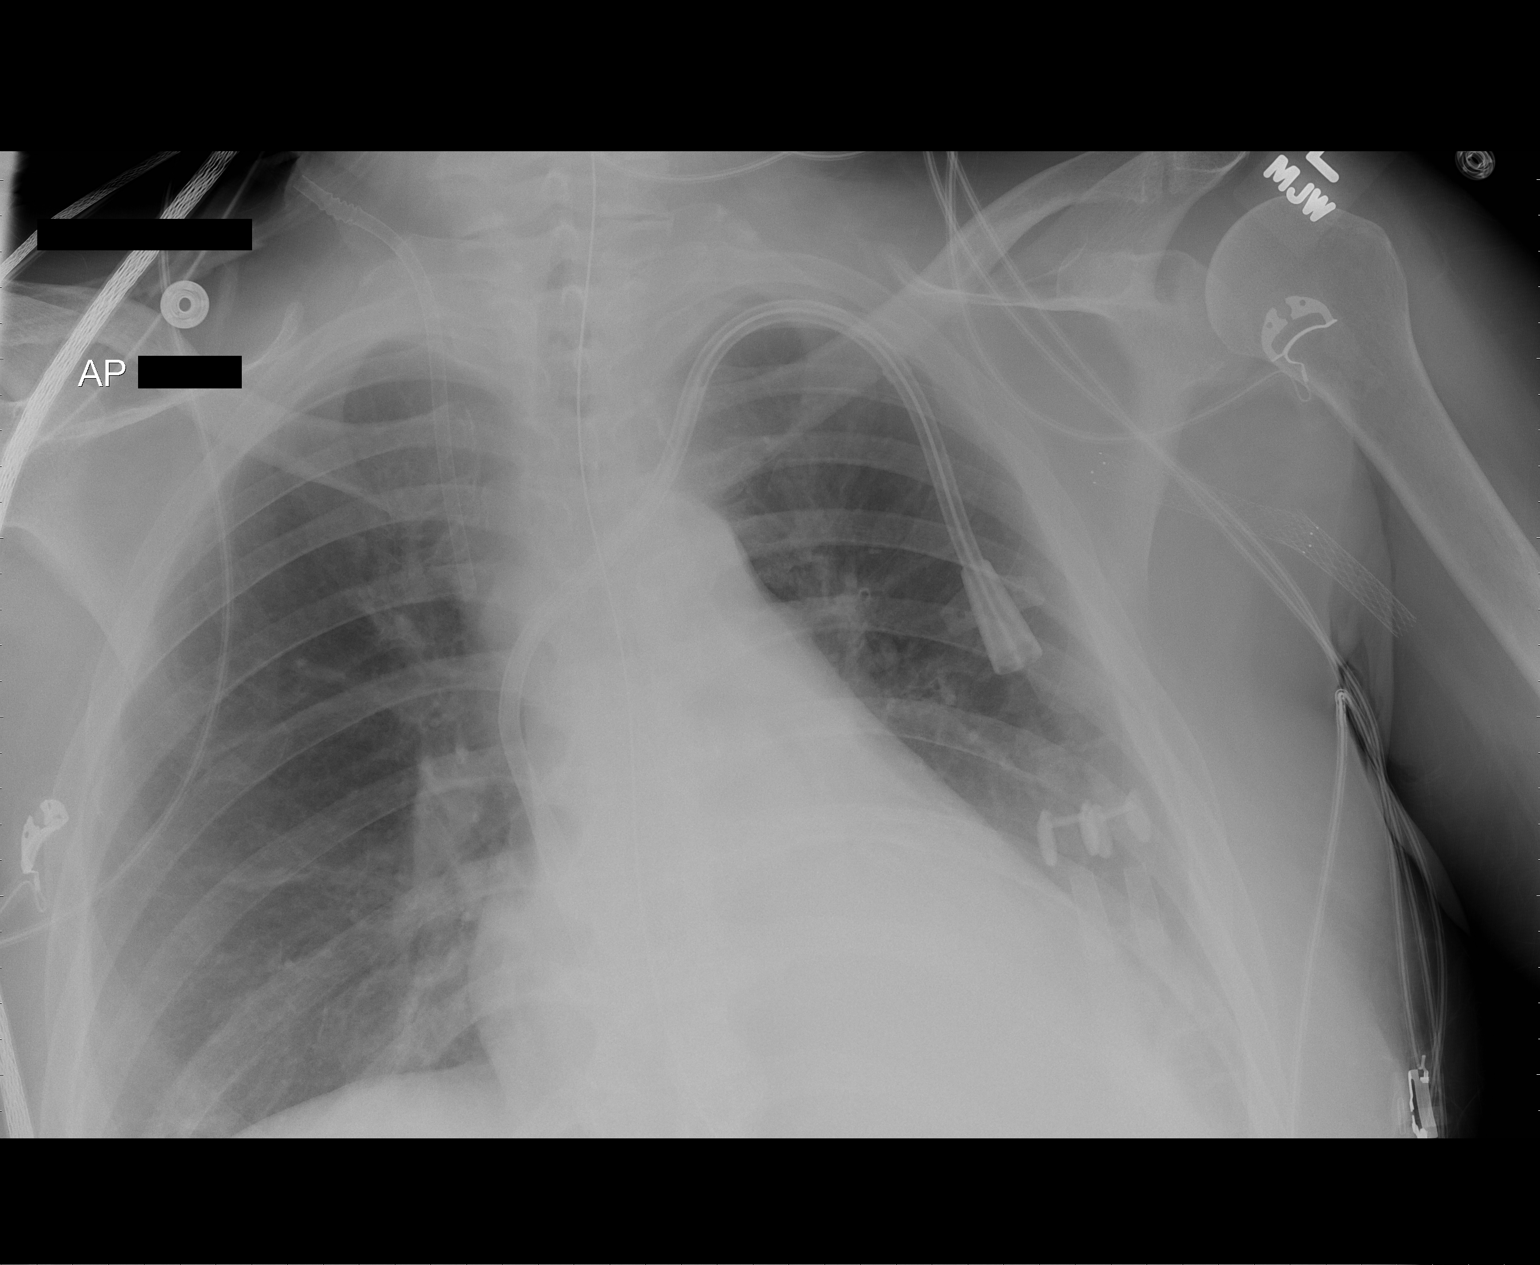

[2 of 2 positions shown; findings below may reference images not displayed]

FINDINGS: Removal of endotracheal tube.  Tofthagen catheters been
retracted.  Right IJ sheath remains. Left central venous line is
unchanged.  Stable cardiac silhouette. Increased left lower lobe
atelectasis.  No pulmonary edema.  No pneumothorax.
IMPRESSION: 1.  Increased left lower lobe atelectasis following extubation.
2.  No pulmonary edema or pneumothorax.

This was made a call report.

## 2013-02-02 ENCOUNTER — Ambulatory Visit: Payer: Medicare Other | Admitting: Neurosurgery

## 2013-02-11 IMAGING — CR DG CHEST 1V PORT
1 series · 1 of 1 positions shown · non-contrast
Comparison: 02/14/2012.

CLINICAL DATA: Cough.  Sepsis.

PORTABLE CHEST - 1 VIEW

[AP]
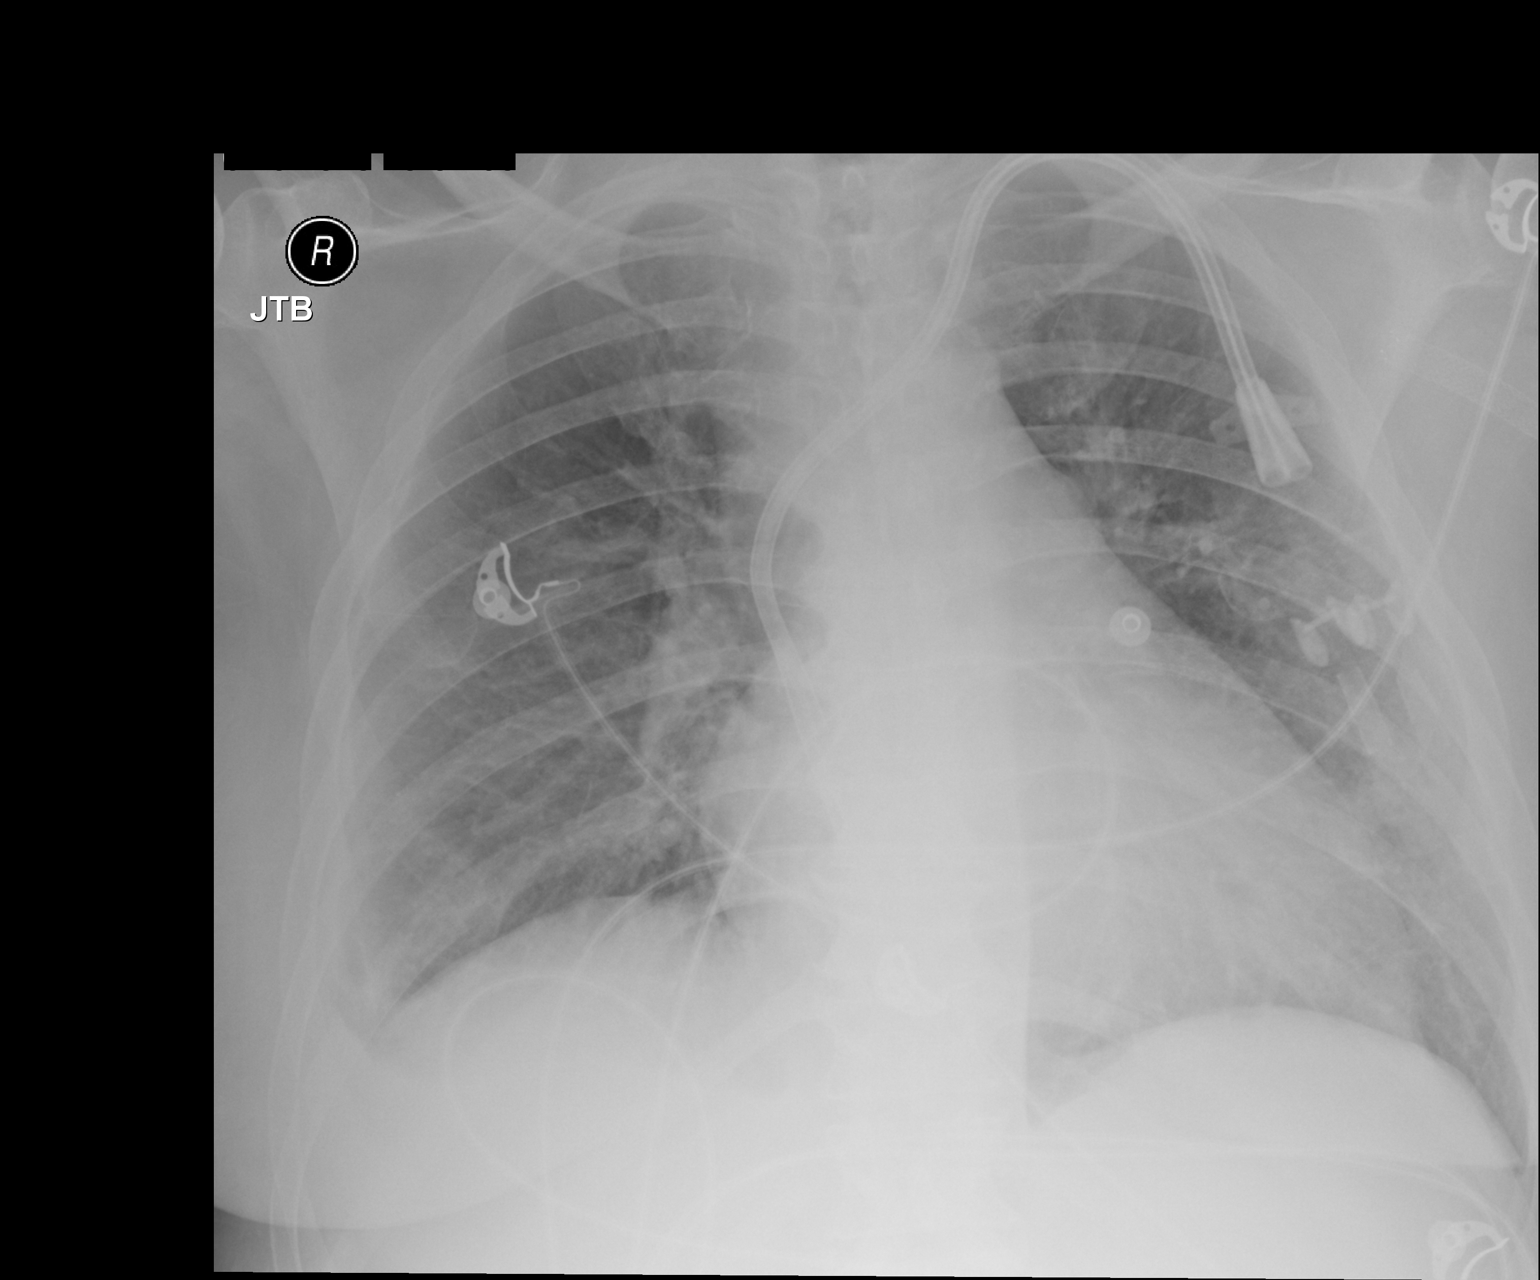

[1 of 1 positions shown; findings below may reference images not displayed]

FINDINGS: Low volume chest.  Basilar atelectasis is present.  Left
IJ dialysis catheters present with the distal tip in the right
atrium.  Right pleural effusion.  Cardiopericardial silhouette is
enlarged. Patchy density is present at the left lung base, likely
representing airspace disease.  Differential considerations include
pneumonia or asymmetric/atypical pulmonary edema.
IMPRESSION: 1.  Low volume chest.
2. Small right pleural effusion and right greater than left basilar
atelectasis.
3.  Mild cardiomegaly.
4.  Patchy airspace disease at the left base along the left heart
border suspicious for lingular pneumonia or edema.

## 2013-02-12 IMAGING — CR DG CHEST 1V PORT
1 series · 1 of 1 positions shown · non-contrast
Comparison: Portable chest 02/15/2012.

CLINICAL DATA: Status post diatek catheter exchange.

PORTABLE CHEST - 1 VIEW

[view not recorded]
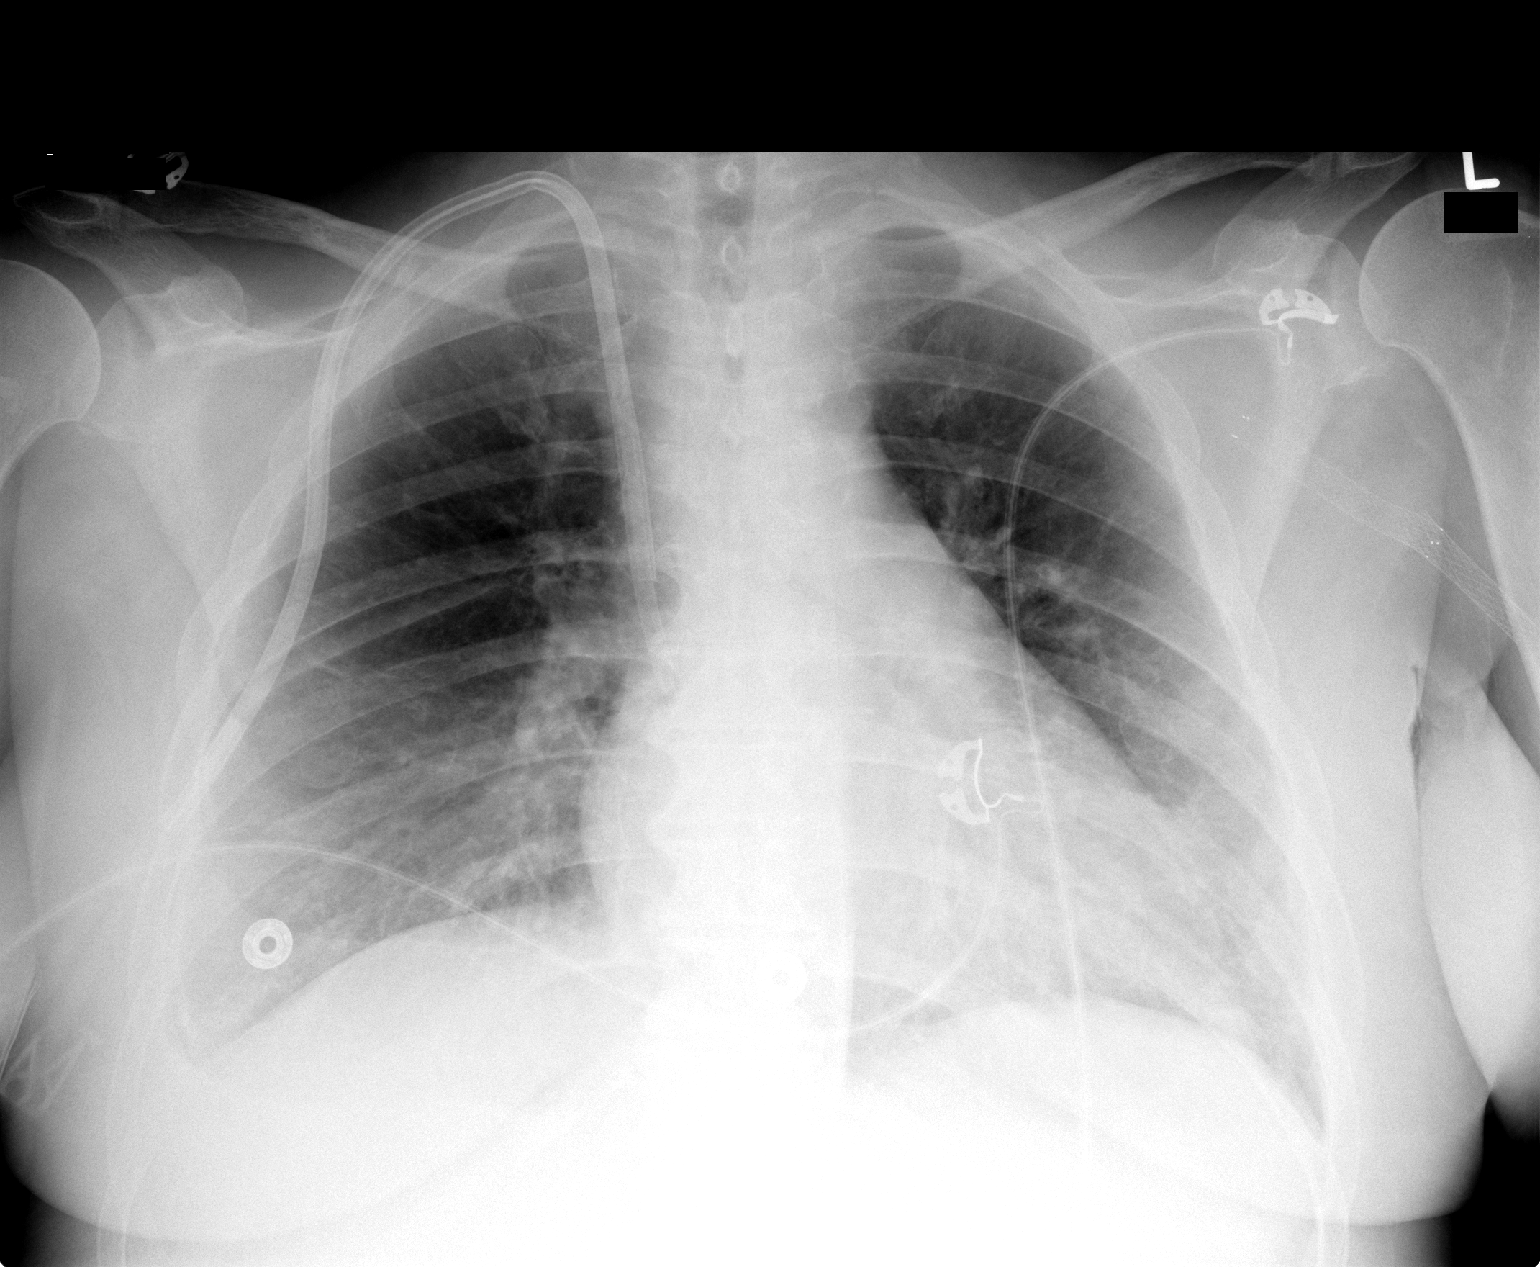

[1 of 1 positions shown; findings below may reference images not displayed]

FINDINGS: The heart size is exaggerated by low lung volumes.  The
previous left IJ dialysis catheter has been removed.  A new right
IJ dialysis catheter is in place.  The tip the distal limb is at
the cavoatrial junction.  There is some bibasilar atelectasis.
IMPRESSION: 1.  New right IJ dialysis catheter.  The tip of the distal lumen is
at the cavoatrial junction.
2.  Interval removal of left IJ dialysis catheter.
3.  Mild bibasilar atelectasis.

## 2013-02-12 IMAGING — CR DG CHEST 1V PORT
1 series · 1 of 1 positions shown · non-contrast
Comparison: Portable chest is 02/14/2012.

CLINICAL DATA: Shortness of breath.

PORTABLE CHEST - 1 VIEW

[AP]
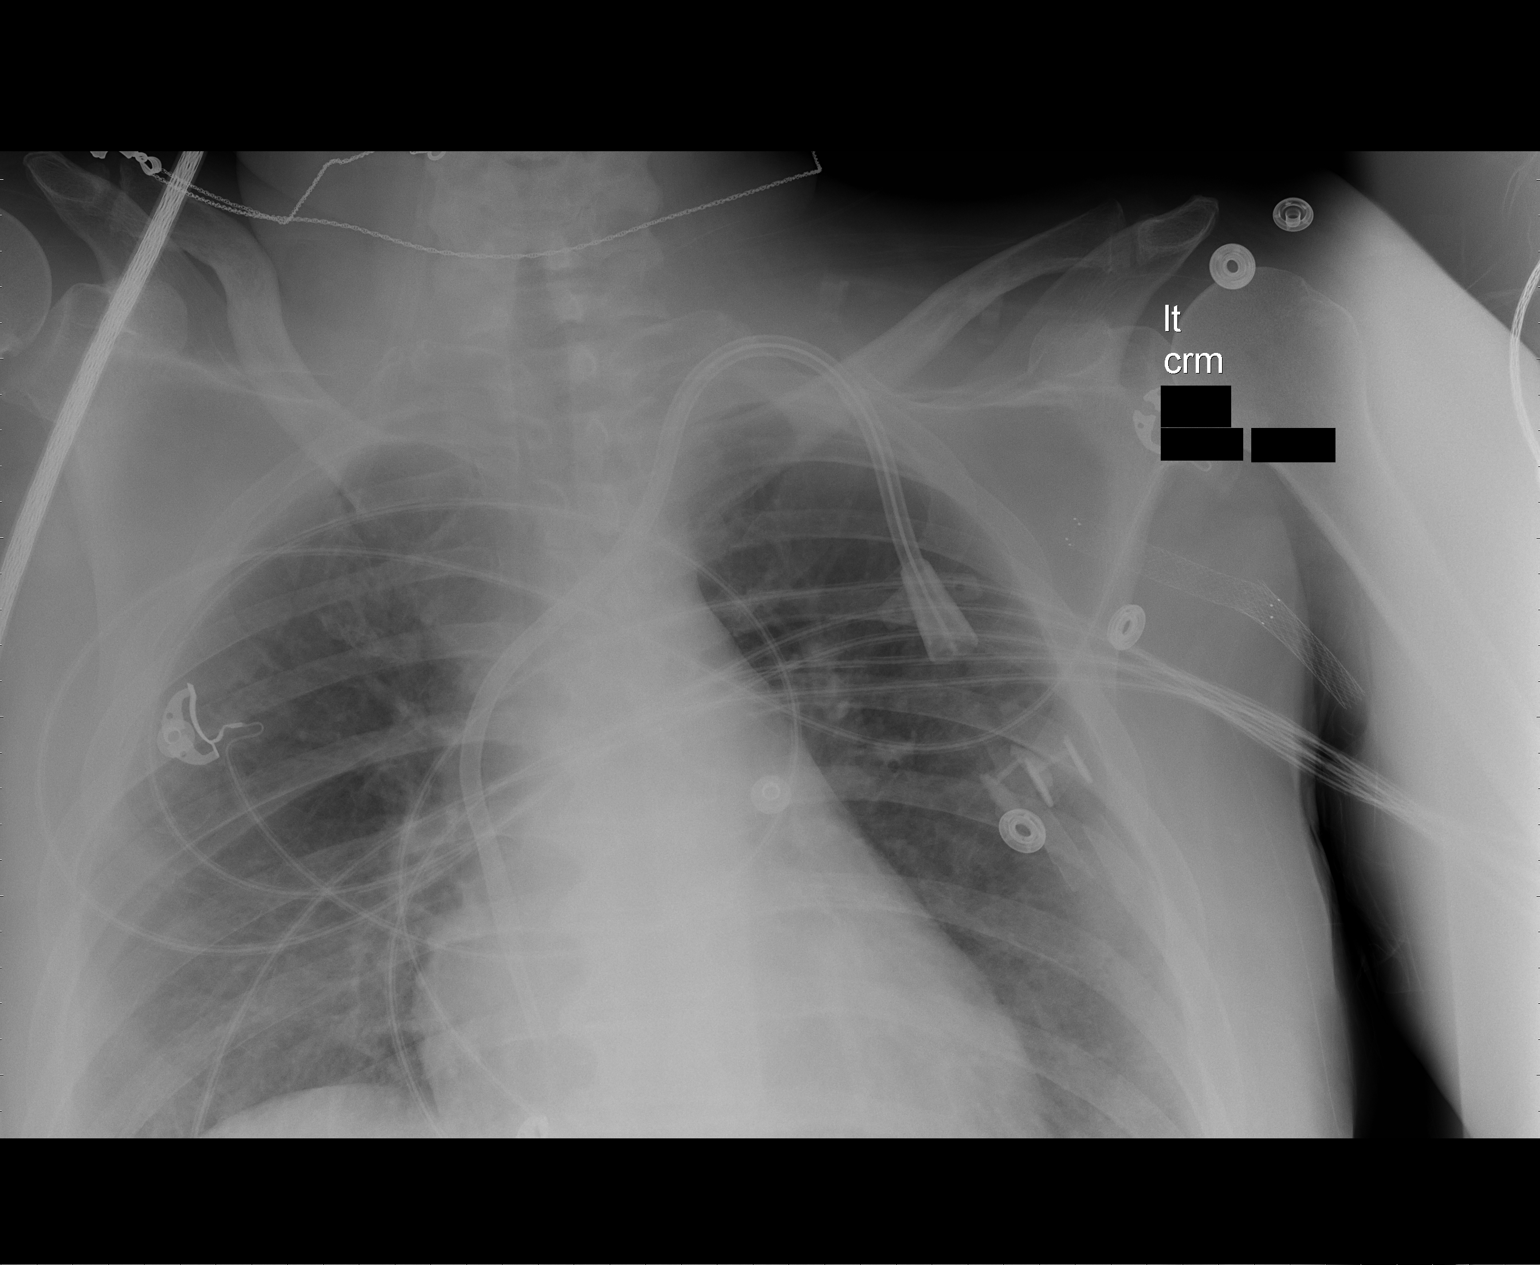

[1 of 1 positions shown; findings below may reference images not displayed]

FINDINGS: A left IJ dialysis catheter is in place.  The heart size
is exaggerated by low lung volumes.  There is some residual
blunting of the right costophrenic angle, improved from the prior
exam.
IMPRESSION: 1.  Slight improved aeration at the lung bases bilaterally.
2.  Persistent low lung volumes.
3.  Persistent blunting of the right costophrenic angle.

## 2013-02-18 IMAGING — CR DG CHEST 1V PORT
1 series · 1 of 1 positions shown · non-contrast
Comparison: 02/15/2012

CLINICAL DATA: Status post dialysis catheter placement

PORTABLE CHEST - 1 VIEW

[AP]
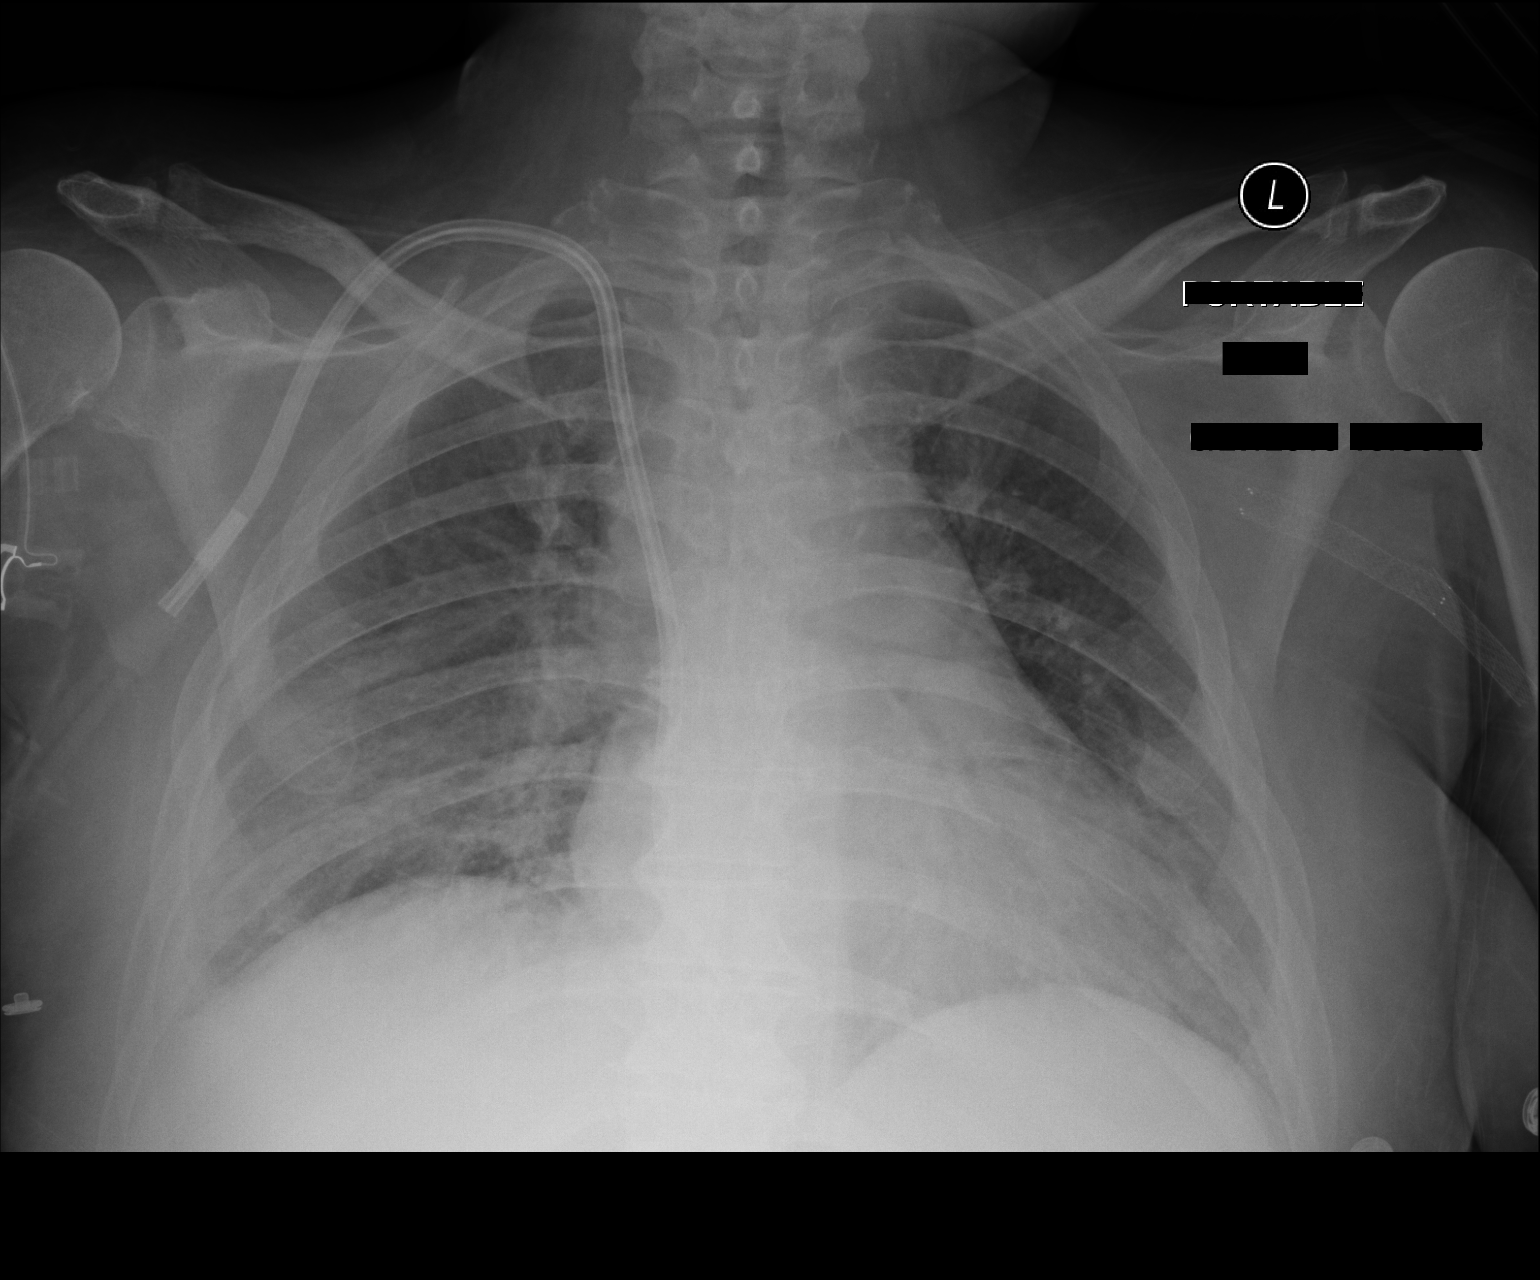

[1 of 1 positions shown; findings below may reference images not displayed]

FINDINGS: There is a right chest wall dialysis catheter with tips
in the right atrium.  Heart size appears normal.

Diminished aeration to both lung bases may be due to dependent
edema or atelectasis.

Heart size is mildly enlarged as before.
IMPRESSION: 1.  Tips of the right-sided dialysis catheter are in the right
atrium.
2.  Diminished aeration to the lung bases which may represent
atelectasis or areas of dependent edema.

## 2013-03-04 IMAGING — CR DG CHEST 1V PORT
1 series · 1 of 1 positions shown · non-contrast
Comparison: Portable exam 0937 hours compared to 02/21/2012

CLINICAL DATA: Chest pain, cough, history diabetes, coronary
disease, prior pulmonary embolism and MI, hypertension

PORTABLE CHEST - 1 VIEW

[view not recorded]
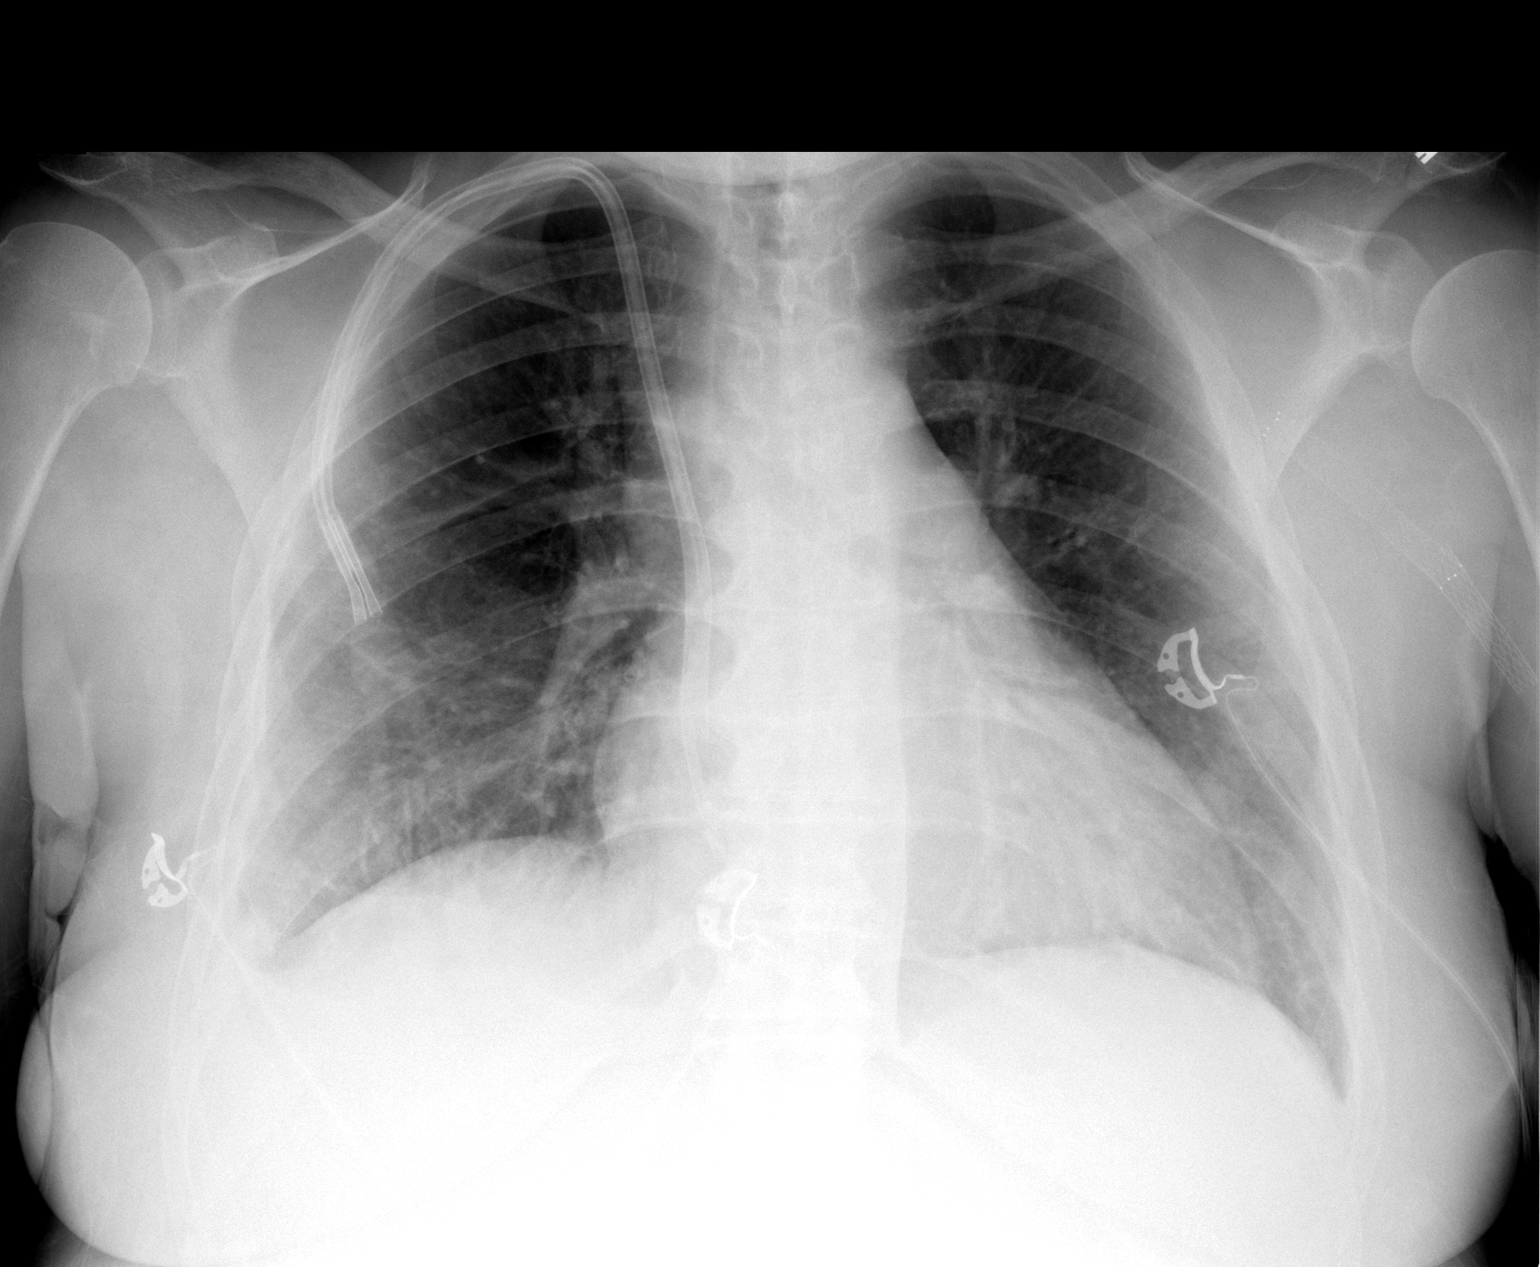

[1 of 1 positions shown; findings below may reference images not displayed]

FINDINGS: Large-bore double-lumen right jugular central venous catheter, tip
projecting over right atrium.
Enlargement of cardiac silhouette with pulmonary vascular
congestion.
Minimal atelectasis right base.
Lungs otherwise clear.
No pleural effusion or pneumothorax.
Stents identified at the left axillary vessels.
IMPRESSION: Enlargement of cardiac silhouette with pulmonary vascular
congestion.
Minimal right basilar atelectasis.

## 2013-03-05 IMAGING — CR DG CHEST 1V PORT
1 series · 1 of 1 positions shown · non-contrast
Comparison: 03/06/2012; 02/21/2012; 02/15/2012

CLINICAL DATA: Chest pain

PORTABLE CHEST - 1 VIEW

[view not recorded]
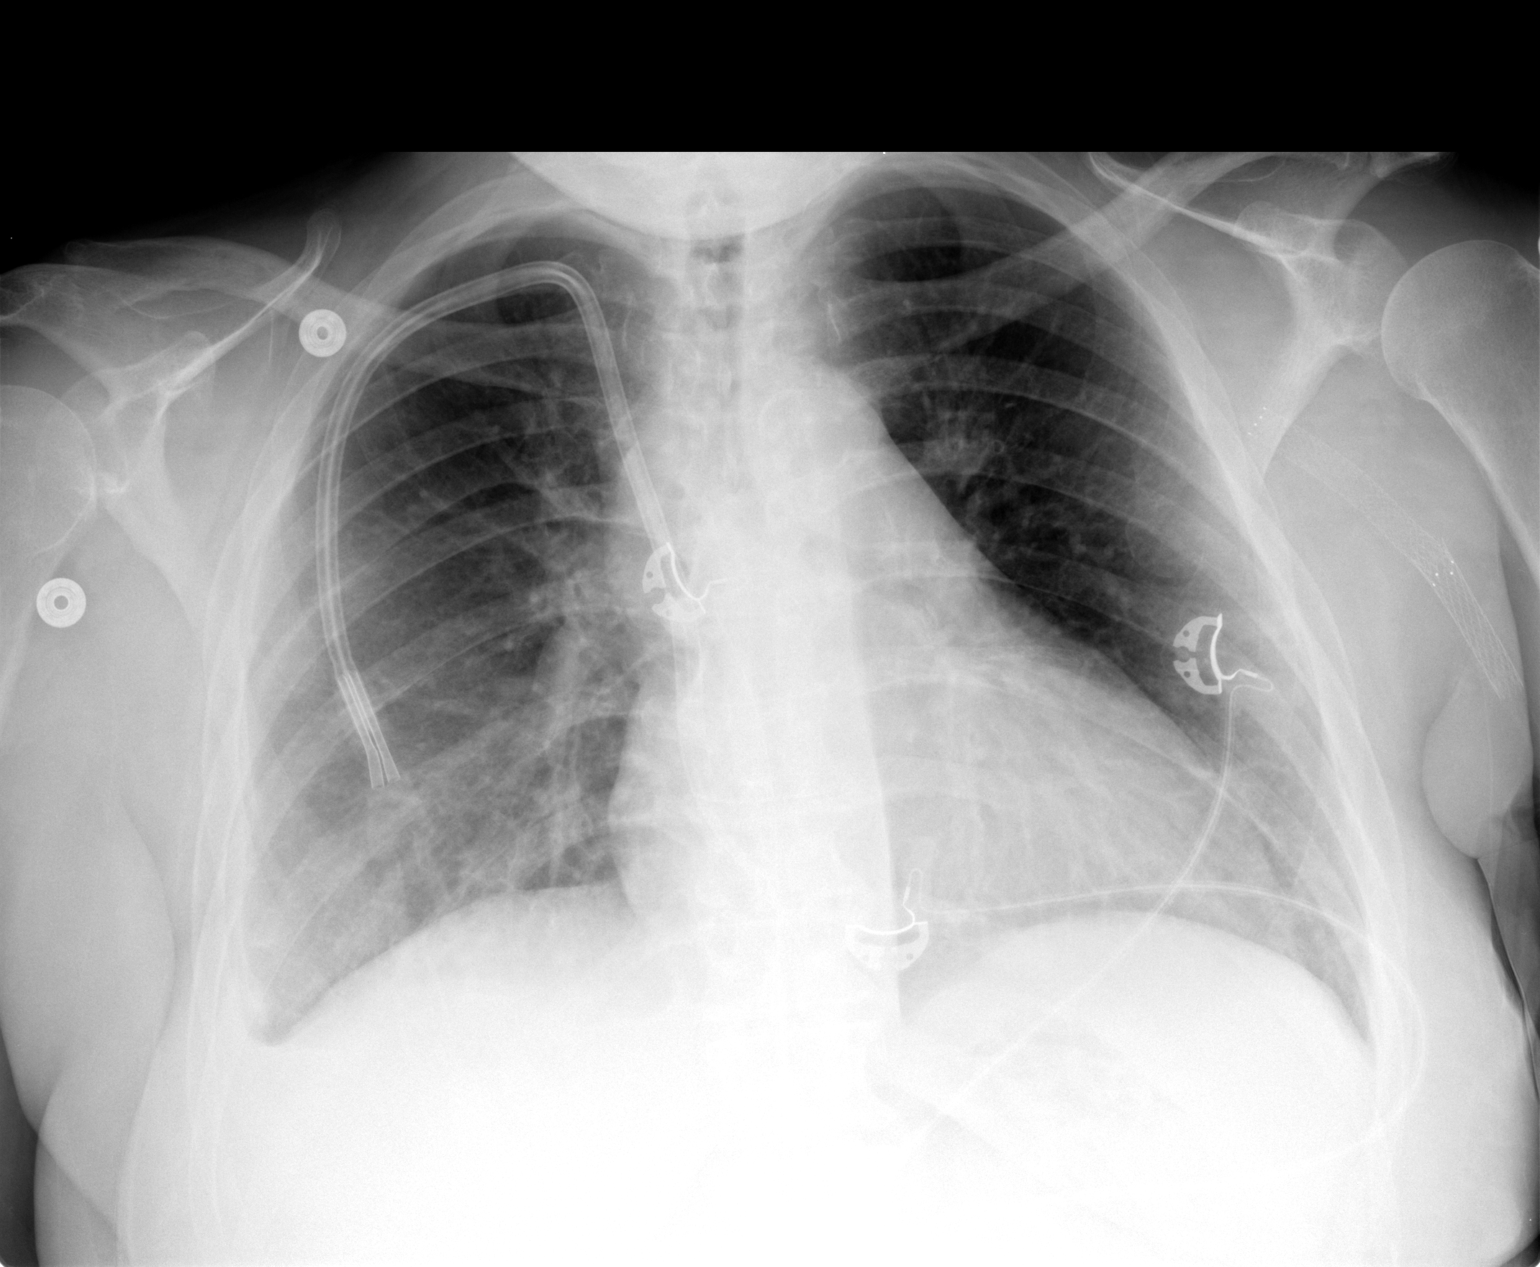

[1 of 1 positions shown; findings below may reference images not displayed]

FINDINGS: Grossly unchanged enlarged cardiac silhouette and mediastinal
contours.  Stable positioning of support apparatus with dialysis
catheter tips overlying the central location of the right atrium.
Grossly unchanged bibasilar heterogeneous opacities, right greater
than left.  There is chronic blunting of the right costophrenic
angle.  No definite pneumothorax.  Unchanged bones. Left axillary
vascular stent.
IMPRESSION: 1.  No acute cardiopulmonary disease.
2.  Chronic blunting of the right costophrenic angle with bibasilar
heterogeneous opacities, right greater than left, favored to
represent atelectasis.

## 2013-03-15 IMAGING — XA IR FLUORO GUIDE CV LINE*R*
1 series · 3 of 3 positions shown · non-contrast
Comparison: None

INDICATION: Malfunctioning dialysis catheter.

TUNNELED CENTRAL VENOUS HEMODIALYSIS CATHETER REPLACEMENT WITH
FLUOROSCOPIC GUIDANCE

[Series 1: run · 3 of 3 slices shown]
[im 1/3]
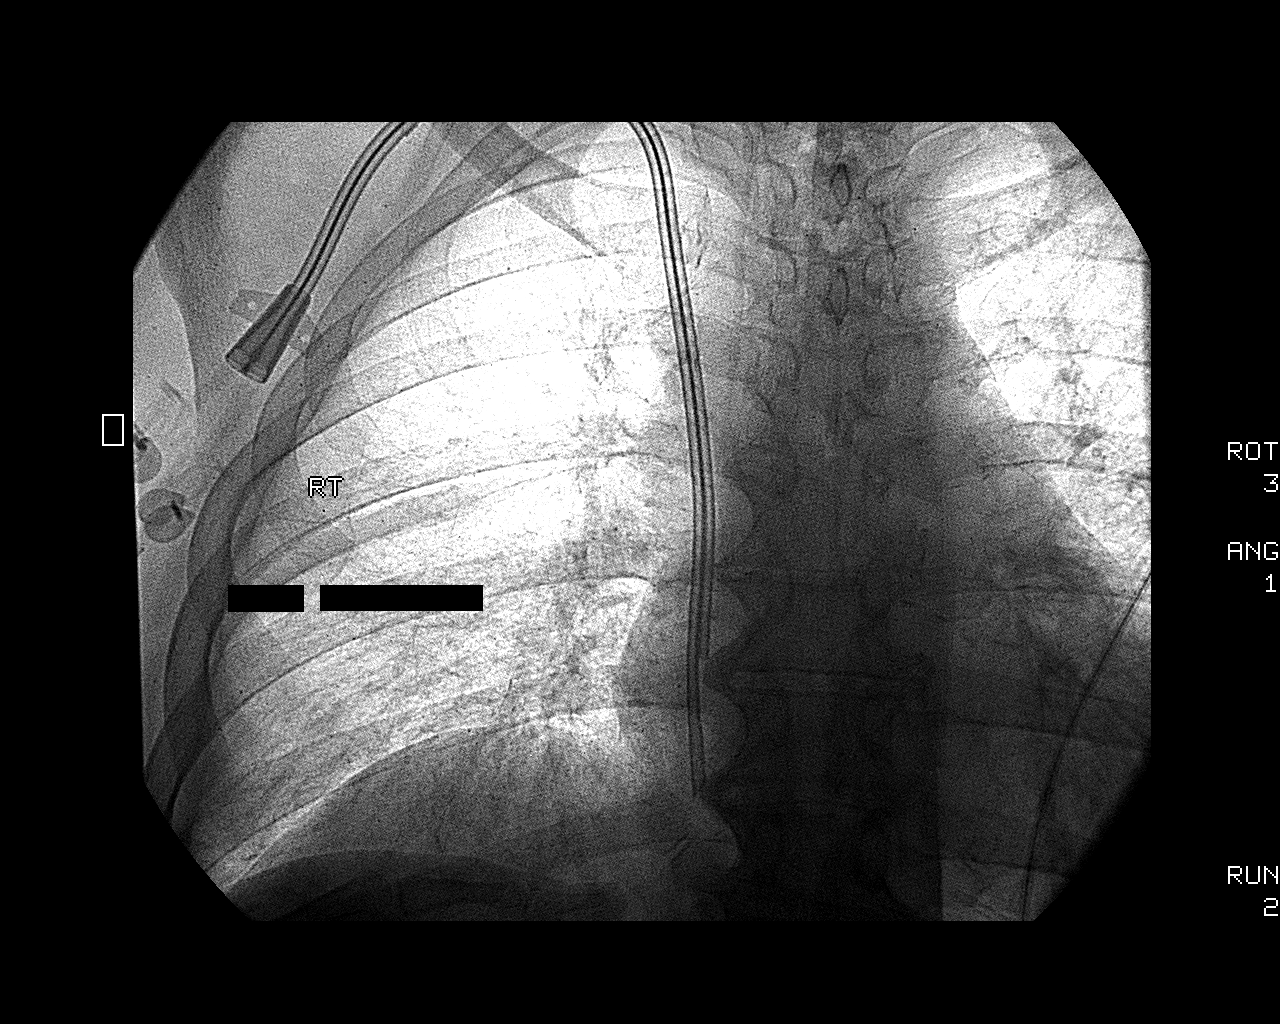
[im 2/3]
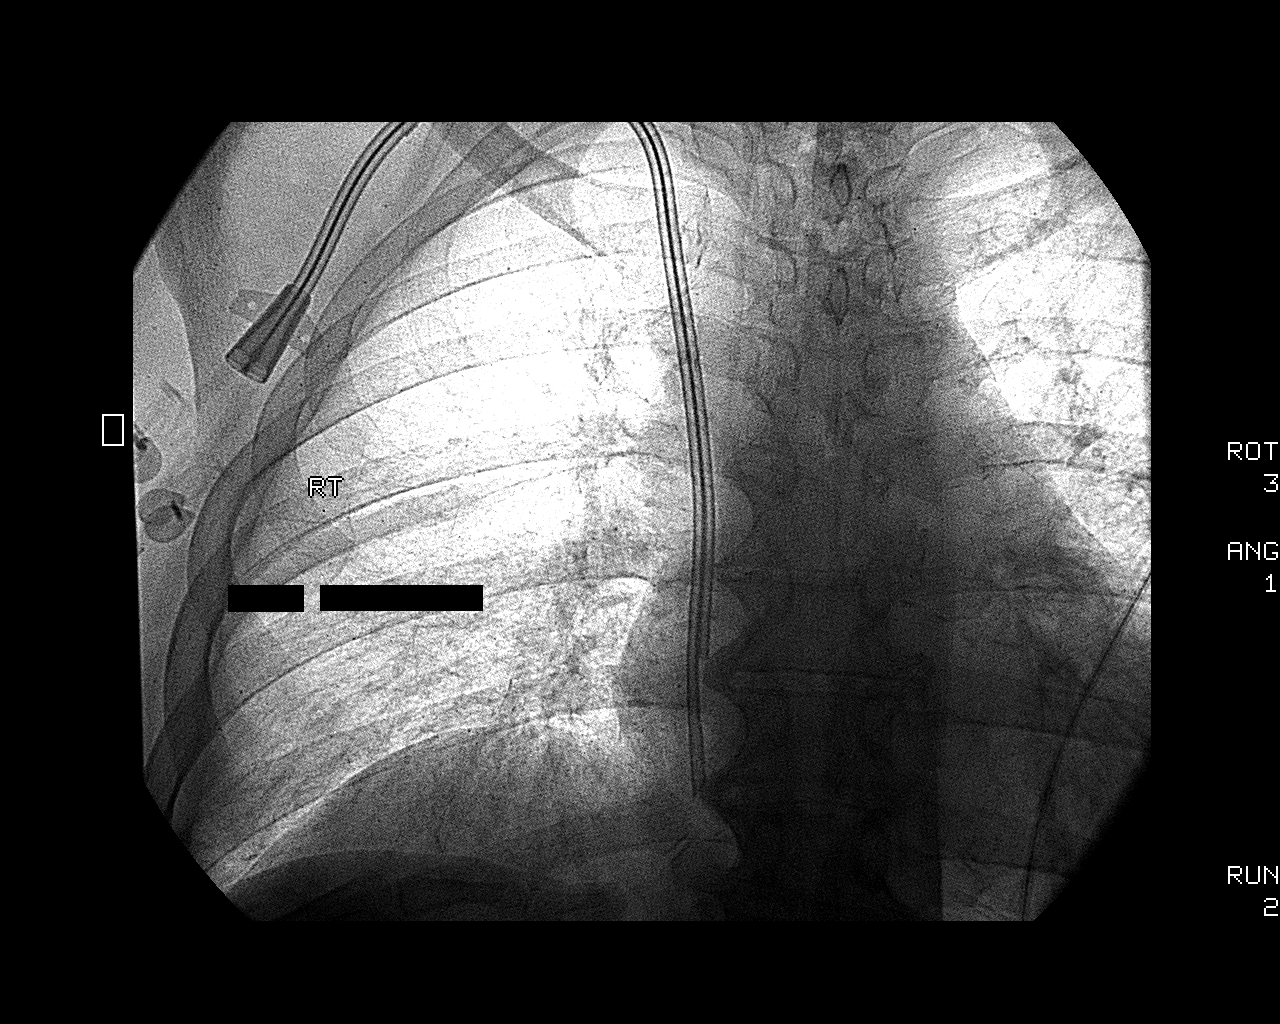
[im 3/3]
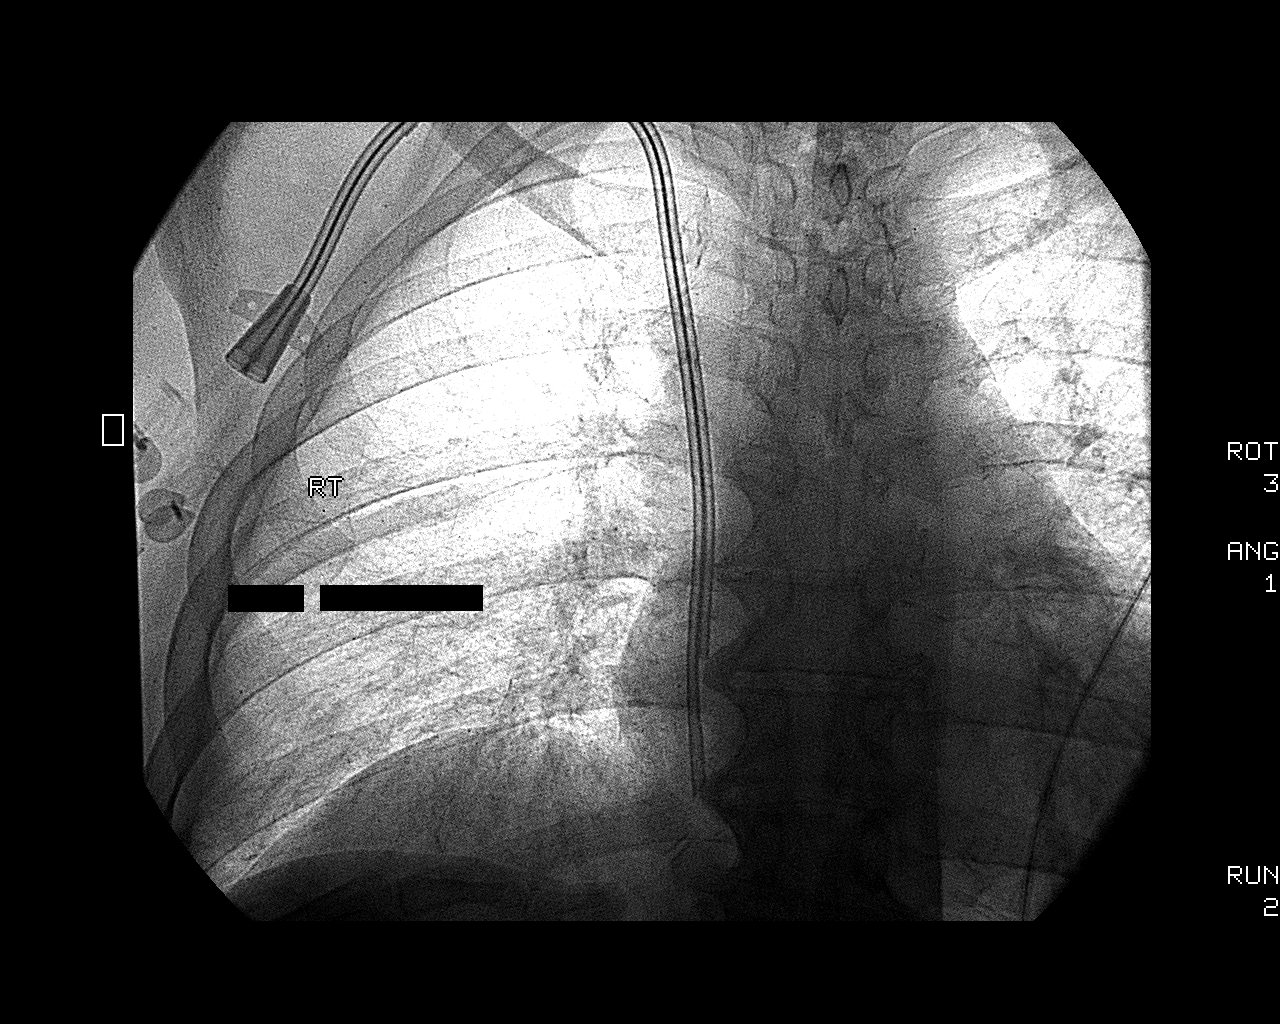

[3 of 3 positions shown; findings below may reference images not displayed]

Intravenous Medications: None

Contrast: None

Fluoroscopy Time: 2.8 minutes.

Complications: None immediate

Findings / Procedure:

Informed written consent was obtained from the patient and the
patient's mother after a discussion of the risks, benefits, and
alternatives to treatment.  Questions regarding the procedure were
encouraged and answered.  The skin and external portion of the
existing hemodialysis catheter was prepped with chlorhexidine in a
sterile fashion, and a sterile drape was applied covering the
operative field.  Maximum barrier sterile technique with sterile
gowns and gloves were used for the procedure.  A timeout was
performed prior to the initiation of the procedure.

Both lumens of the hemodialysis catheter were cannulated with a
stiff Glidewire and advanced to the level of the IVC.  Under
intermittent fluoroscopic guidance, the existing dialysis catheter
was exchanged for new tunneled 23 cm tip to cuff HemoSplit dialysis
catheter with tips ultimately terminating within the superior
aspect of the right atrium.  Final catheter positioning was
confirmed and documented with a spot radiographic image.  The
catheter aspirates and flushes normally.  The catheter was flushed
with appropriate volume heparin dwells.

The catheter exit site was secured with a 0-Prolene retention
sutures.  Both lumens were heparinized.  A dressing was placed.
The patient tolerated the procedure well without immediate post
procedural complication.
IMPRESSION: Successful fluoroscopic guided replacement of 23 cm tip to cuff
tunneled hemodialysis catheter via the right internal jugular vein
with tips terminating within the right atrium.  The catheter is
ready for immediate use.

## 2013-03-26 IMAGING — XA IR FLUORO GUIDE CV LINE*R*
1 series · 2 of 2 positions shown · non-contrast
Comparison: none

CLINICAL DATA: End-stage renal disease, poorly functioning right IJ
dialysis catheter

[Series 1: run · 2 of 2 slices shown]
[im 1/2]
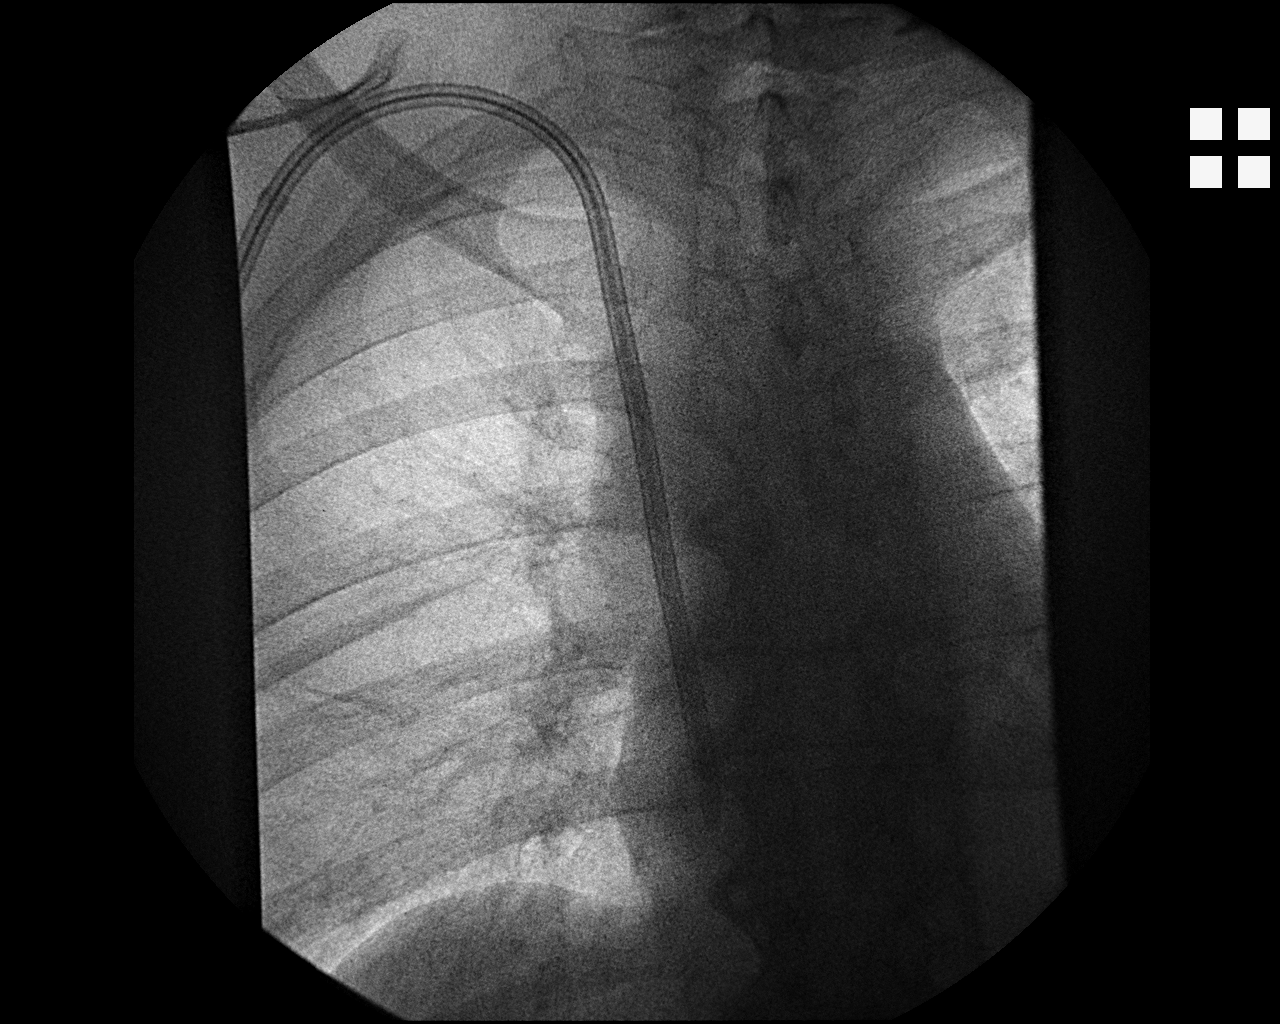
[im 2/2]
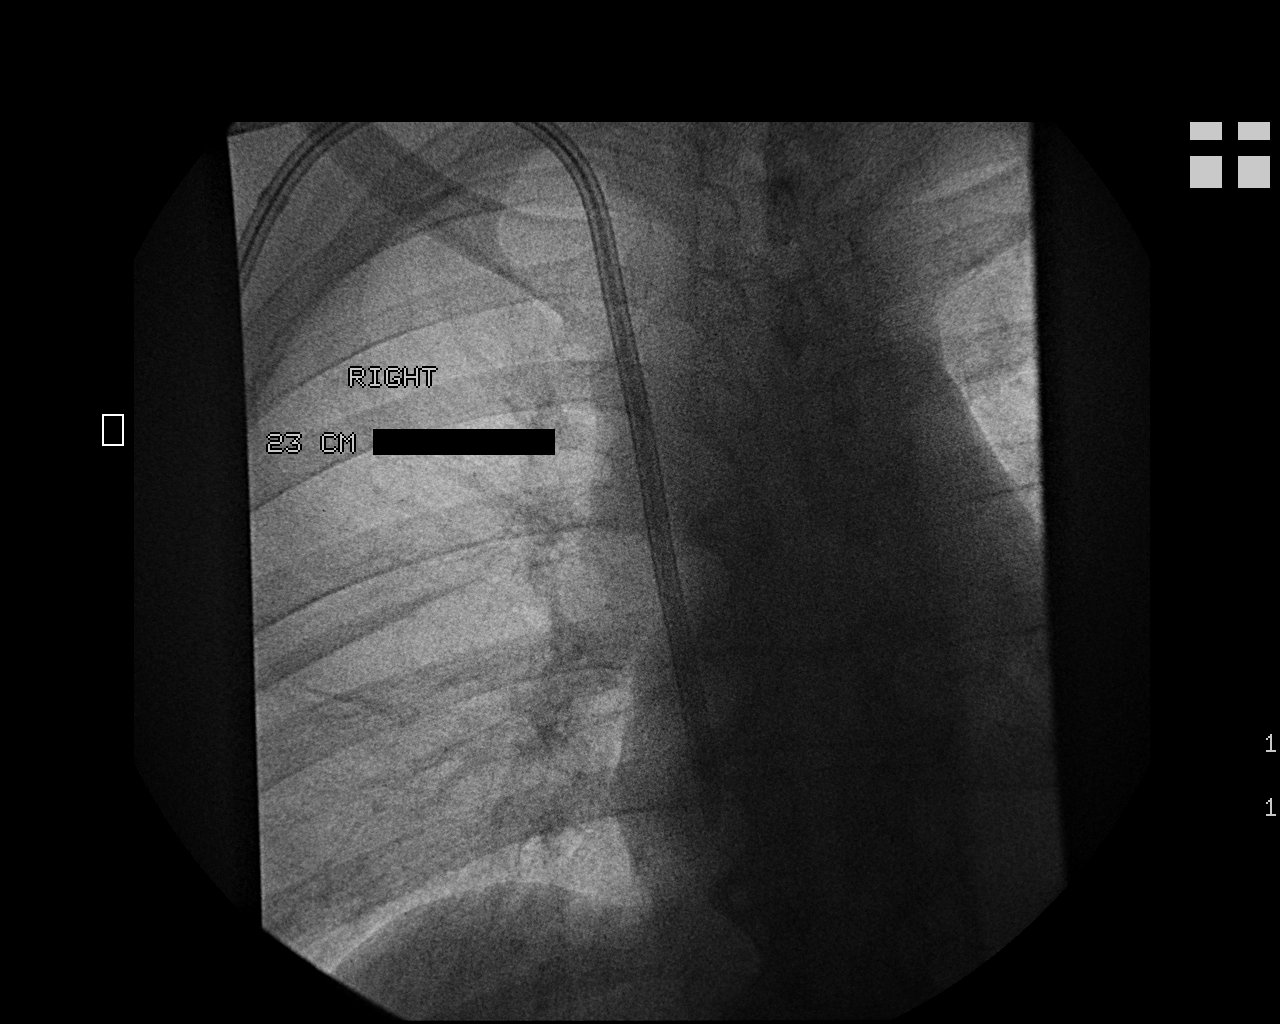

[2 of 2 positions shown; findings below may reference images not displayed]

FLUOROSCOPIC RIGHT IJ DIALYSIS CATHETER EXCHANGE

Date:  03/28/2012 [DATE]

Radiologist:  Arden Lollar, M.D.

Medications:  1 gram ancef administered within 1 hour the
procedure.

Guidance:  Fluoroscopic

Fluoroscopy time:  0.5 minutes

Sedation time:  None.

Contrast volume:  None.

Complications:  No immediate

PROCEDURE/FINDINGS:

Informed consent was obtained from the patient following
explanation of the procedure, risks, benefits and alternatives.
The patient understands, agrees and consents for the procedure.
All questions were addressed.  A time out was performed.

Maximal barrier sterile technique utilized including caps, mask,
sterile gowns, sterile gloves, large sterile drape, hand hygiene,
and Chloroprep

Under sterile conditions and local anesthesia, the existing right
IJ tunneled dialysis catheter was cut and removed over a stiff
Glidewire.  Guide wire was advanced to the IVC.  A new 23 cm tip to
cuff Equistream catheter was advanced with the tips positioned in
the proximal right atrium.  Images obtained for documentation.
Catheter secured at the skin site with Gelfoam pledgets and a
Prolene suture.  Blood aspirated easily from both lumens followed
by saline and heparin flushes.  Caps applied.  Sterile dressing
placed over the side.  No immediate complication.  The patient
tolerated the procedure well.
IMPRESSION: Successful fluoroscopic exchange of the right IJ tunnel dialysis
catheter with a 23 cm to cuff Equistream catheter.  Tips in the
proximal right atrium ready for use.

## 2013-04-20 IMAGING — CR DG CHEST 2V
2 series · 2 of 2 positions shown · non-contrast
Comparison: Chest x-ray 03/07/2012.

CLINICAL DATA: Chest pain and shortness of breath.

CHEST - 2 VIEW

[w chest lat]
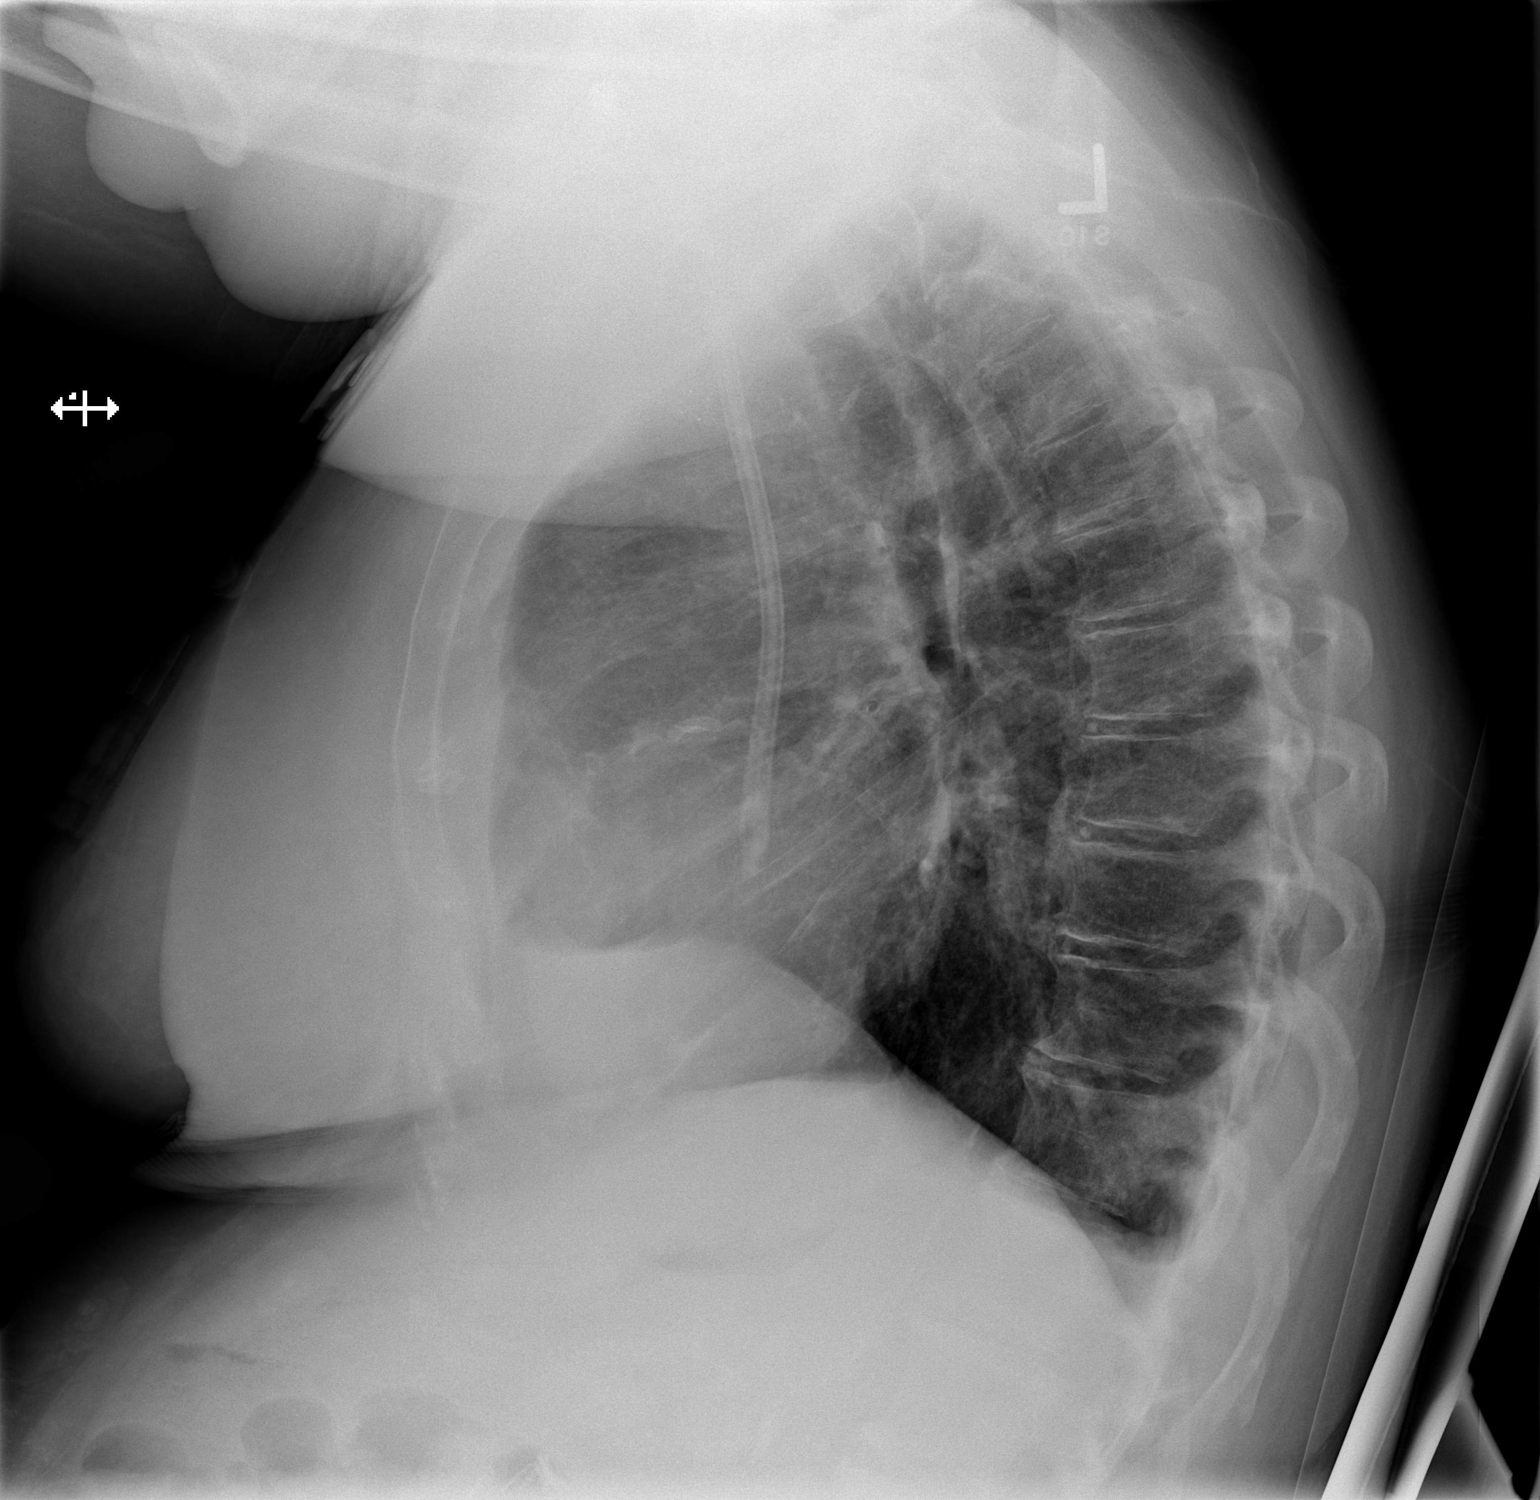

[x chest ap]
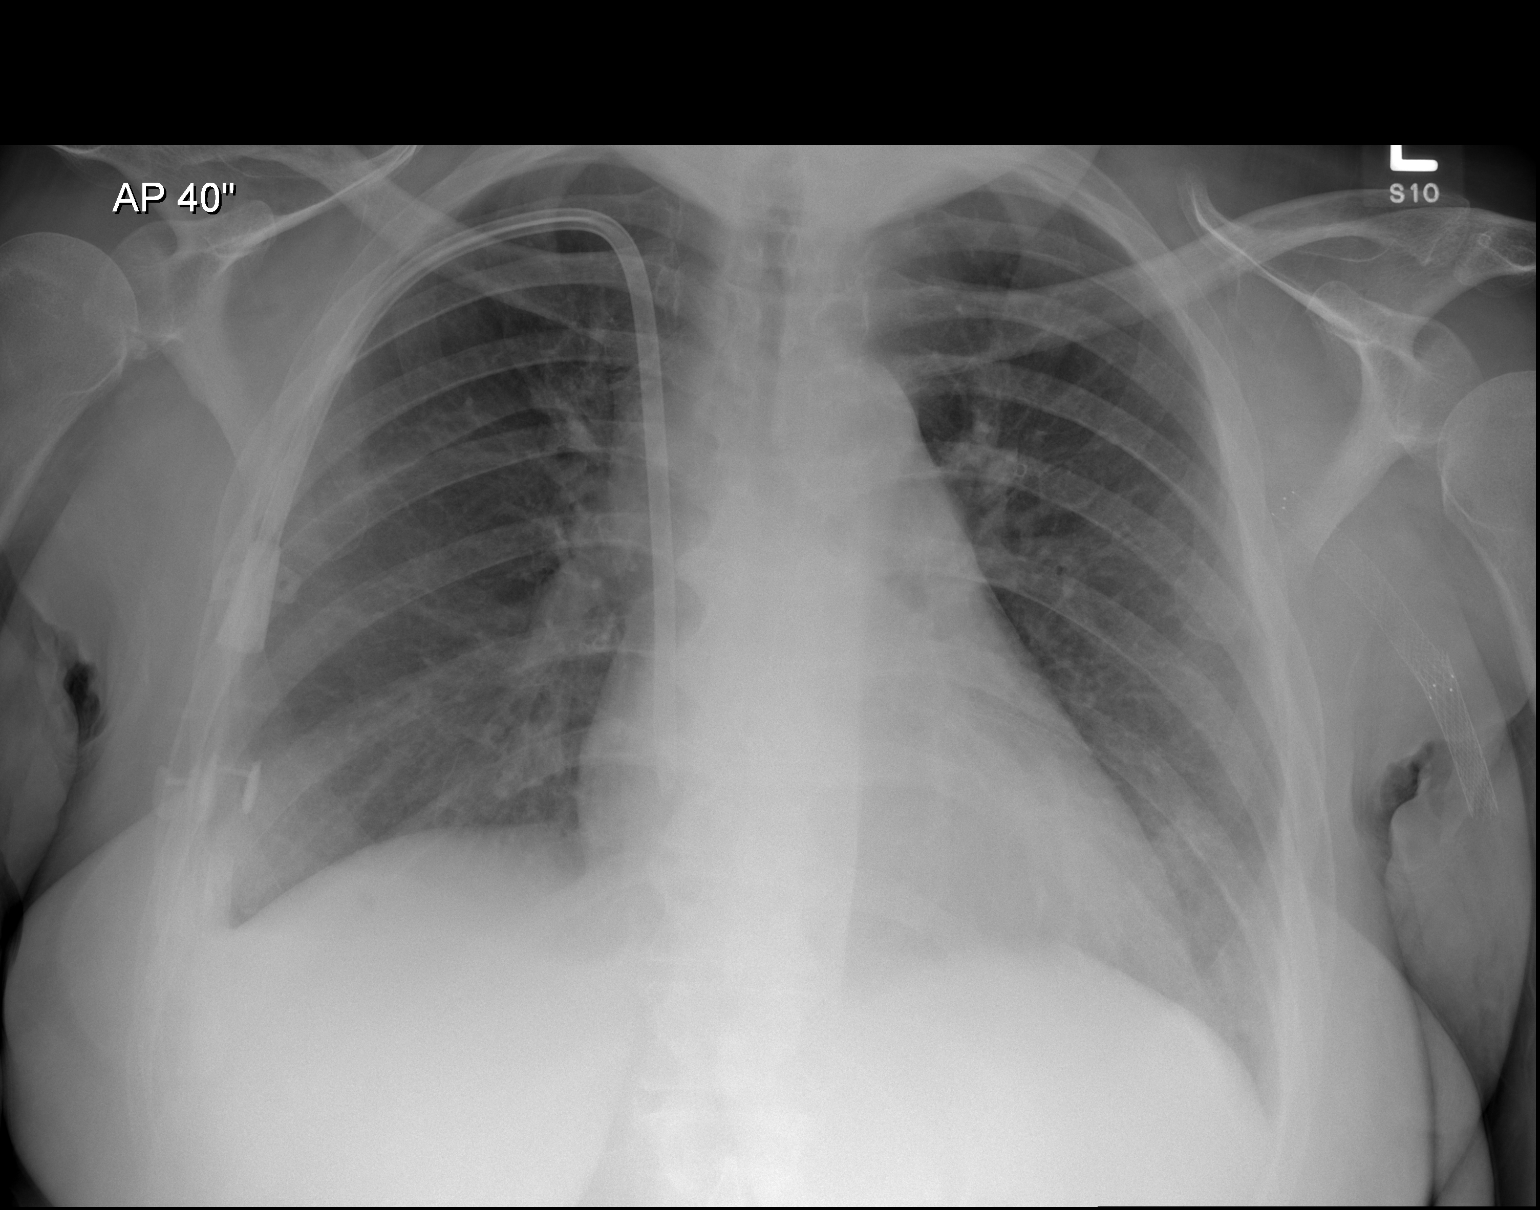

[2 of 2 positions shown; findings below may reference images not displayed]

FINDINGS: Lung volumes are low.  No consolidative airspace disease.
Small right pleural effusion.  Pulmonary vasculature is normal.
Heart size is upper limits of normal.  Mediastinal contours are
unremarkable.  Right-sided internal jugular PermCath with tips
terminating in the right atrium.  Atherosclerosis in the thoracic
aorta.
IMPRESSION: 1.  Small right-sided pleural effusion.
2.  Atherosclerosis.

## 2013-04-21 IMAGING — CR DG CHEST 1V PORT
2 series · 2 of 2 positions shown · non-contrast
Comparison: 04/22/2012.

CLINICAL DATA: Dialysis catheter insertion.

PORTABLE CHEST - 1 VIEW

[AP (1 of 2)]
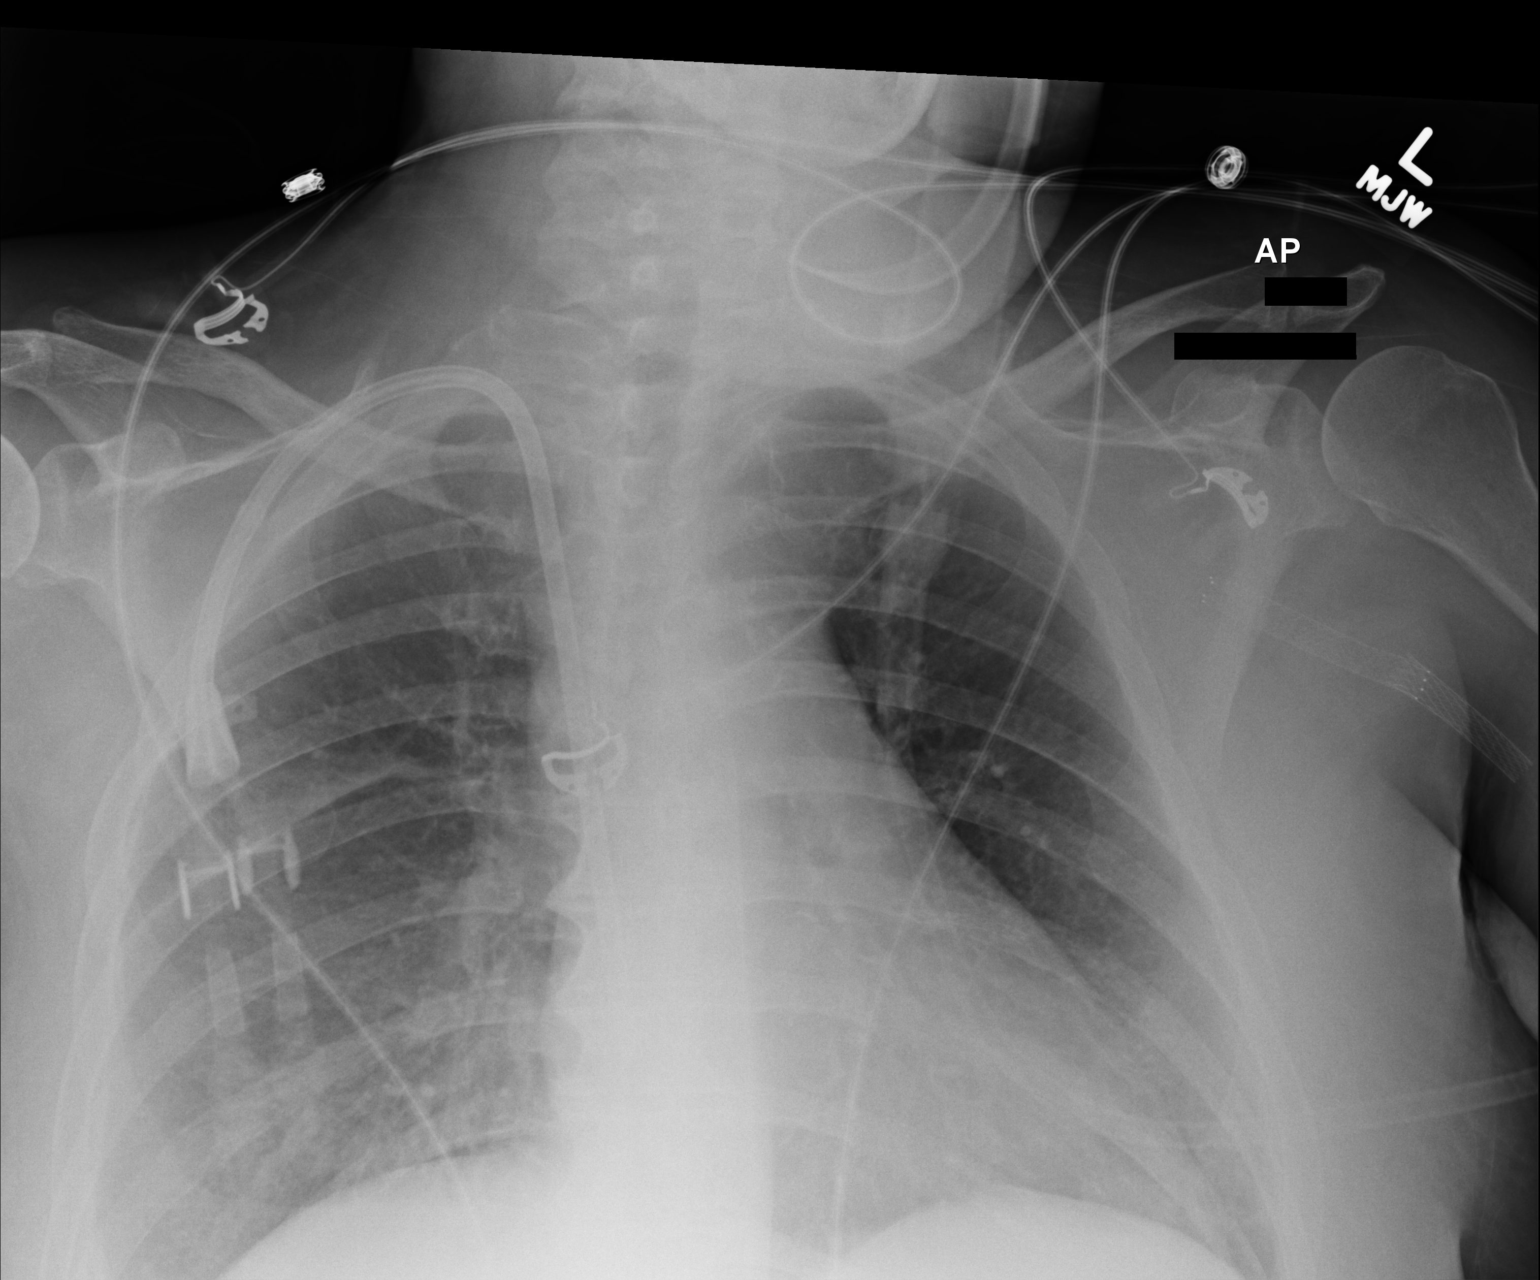

[AP (2 of 2)]
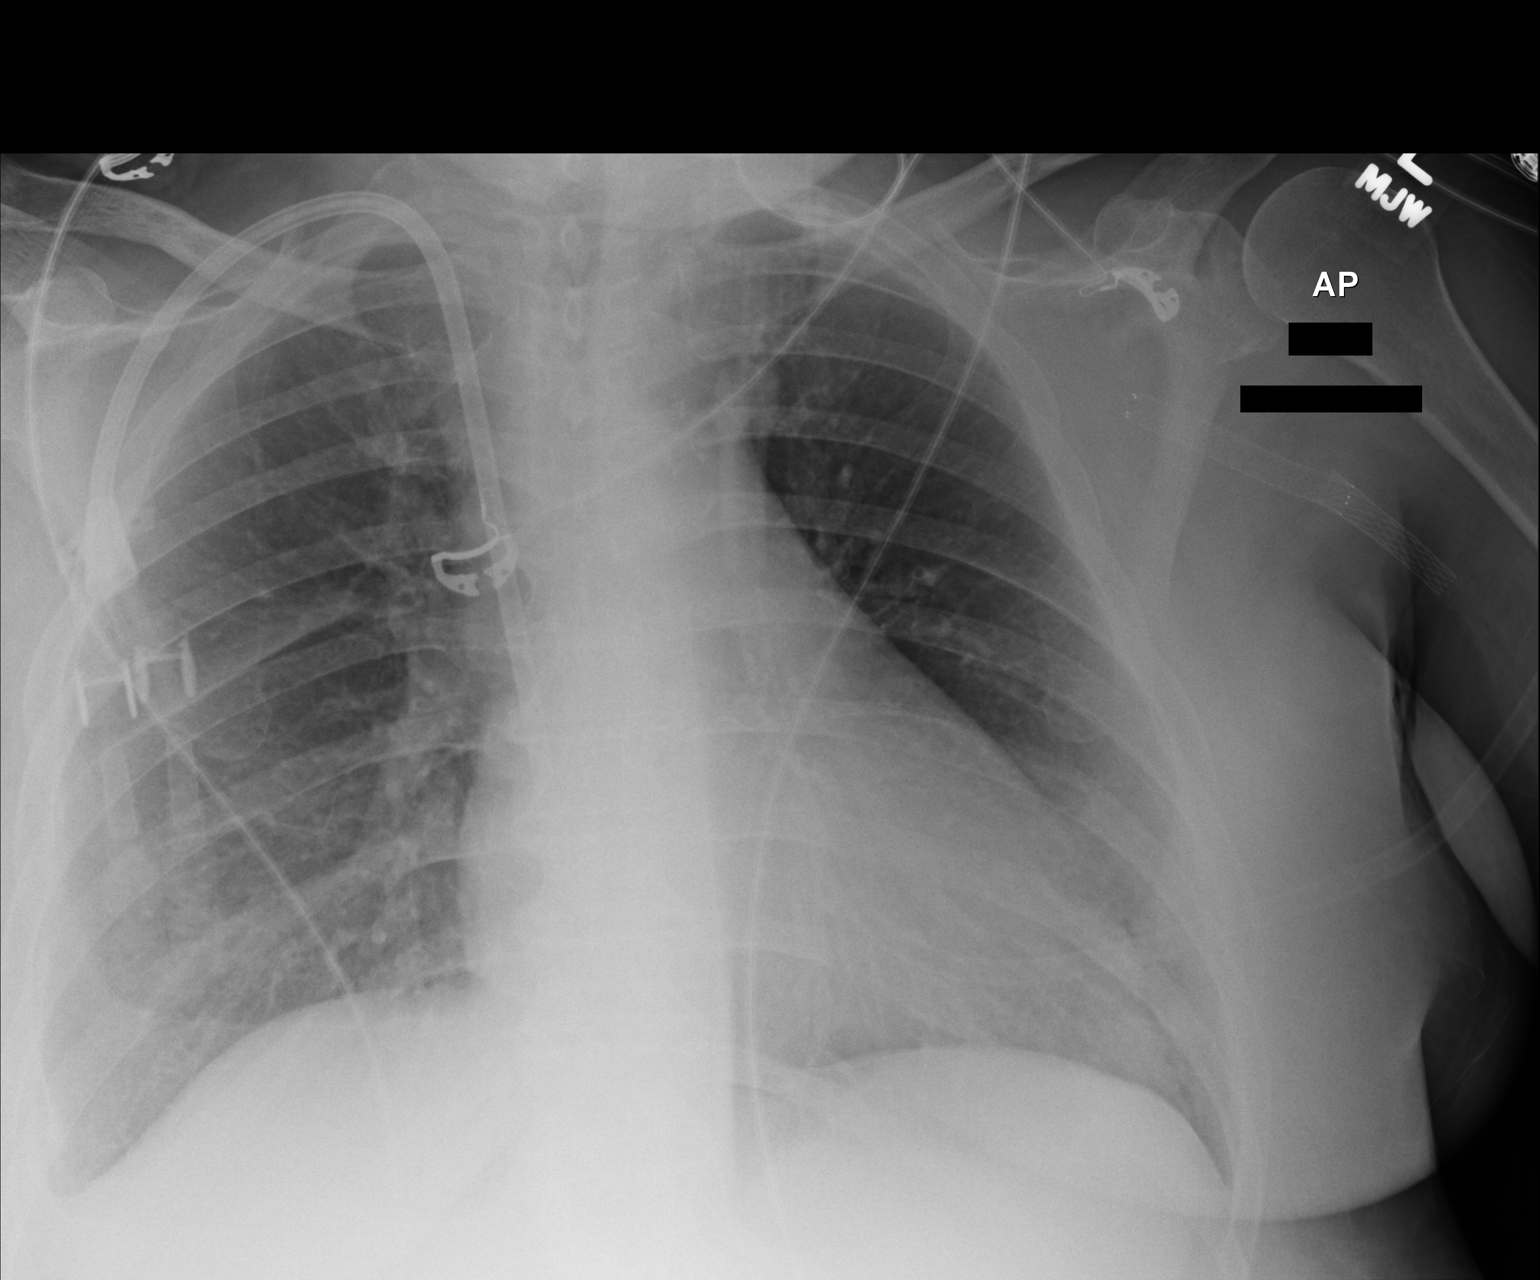

[2 of 2 positions shown; findings below may reference images not displayed]

FINDINGS: Right IJ dialysis catheter tip projects in the region of
the SVC/RA junction.  Trachea is midline.  Heart size normal.
There may be mild diffuse interstitial prominence.  Tiny right
pleural effusion. A vascular stent is seen in the left axilla.
IMPRESSION: Possible mild edema with a small right pleural effusion.

## 2013-04-21 IMAGING — CR DG CHEST 1V PORT
1 series · 1 of 1 positions shown · non-contrast
Comparison: 04/23/2012.

CLINICAL DATA: Post intubation.  Dialysis catheter replaced.

PORTABLE CHEST - 1 VIEW

[AP]
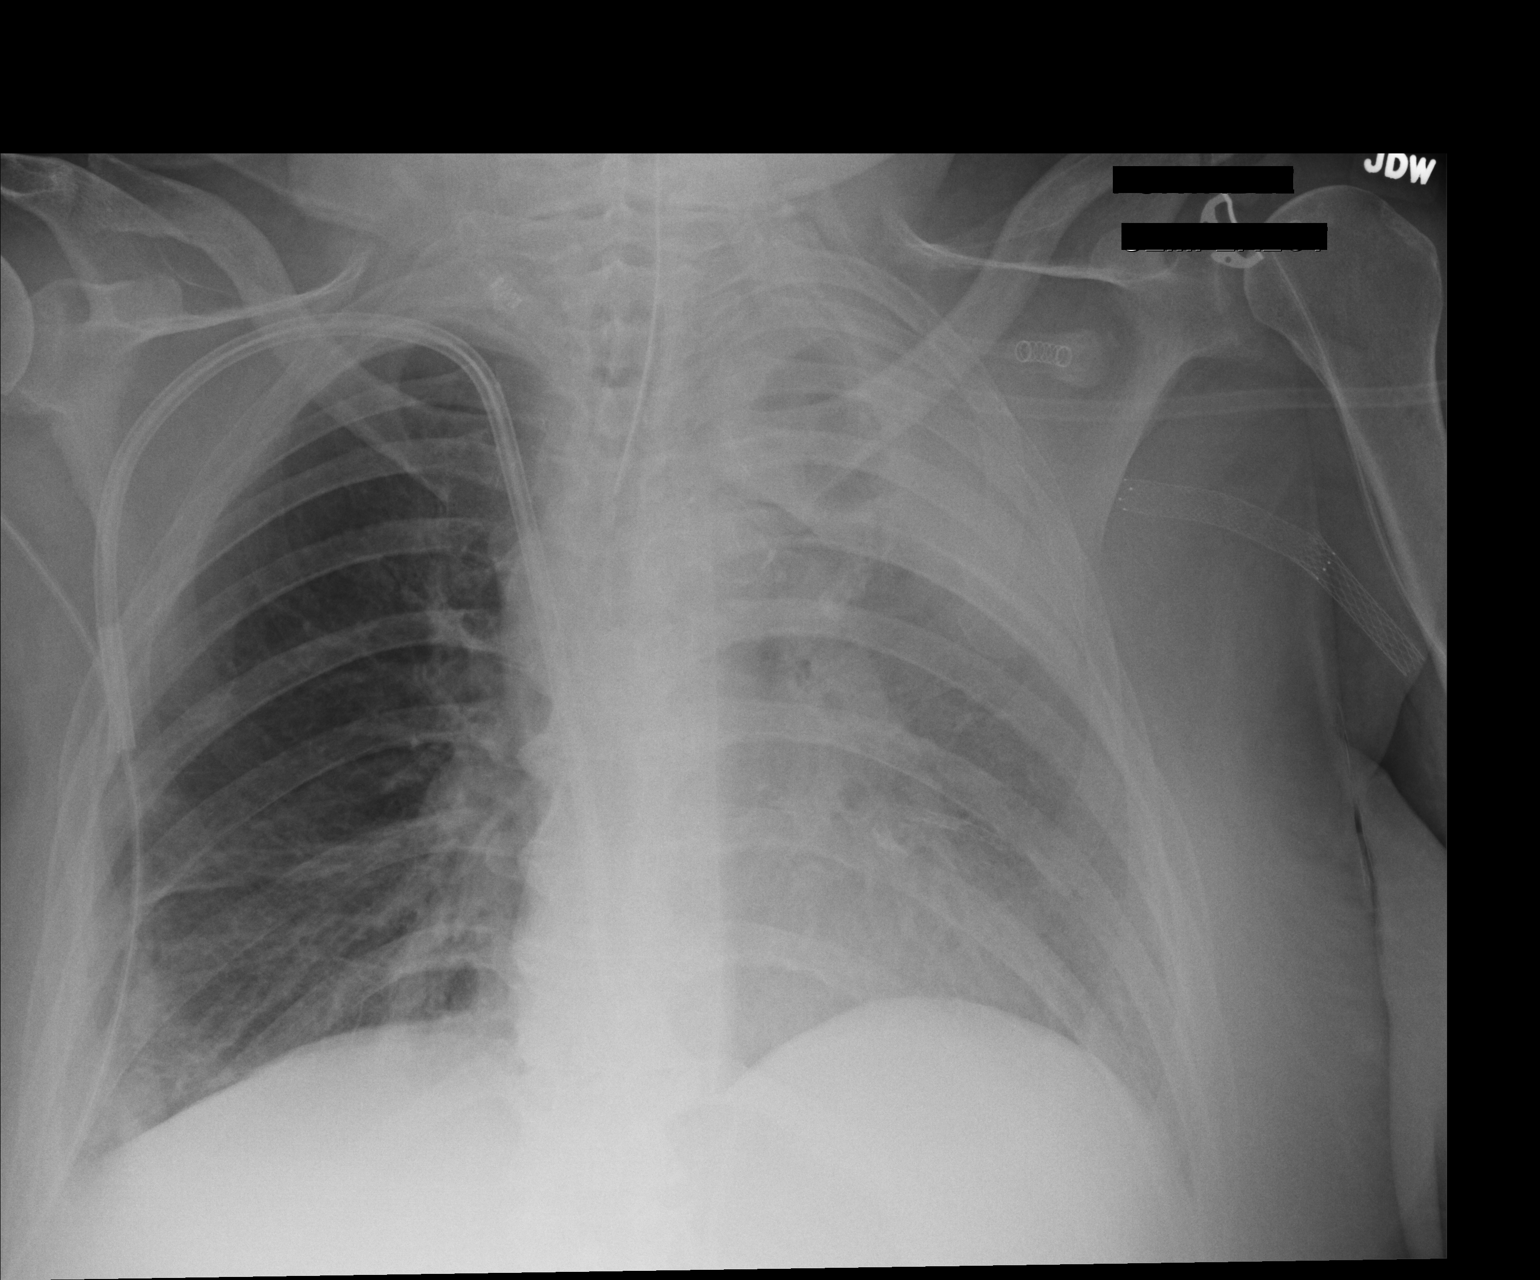

[1 of 1 positions shown; findings below may reference images not displayed]

FINDINGS: Endotracheal tube is present with the tip 36 mm from the
carina.

Right IJ dialysis catheter present with the distal tip in the right
atrium.  Basilar atelectasis is present.

In the interval since the prior exam earlier today (9033 hours),
there is new opacification of the left upper lateral hemithorax,
some of which is due to scapular overlap.  No pneumothorax.
IMPRESSION: 1. Endotracheal tube 36 mm from the carina.
2.  Right IJ central line present within the distal tip in the
right atrium.
3.  New opacification of the left upper chest.  If left vascular
access was attempted, potentially this could represent hemothorax.
The other primary differential consideration is left upper lobe
atelectasis associated with mucous plugging.

## 2013-04-22 IMAGING — CR DG CHEST 1V PORT
1 series · 1 of 1 positions shown · non-contrast
Comparison: 04/23/2012

CLINICAL DATA: Follow up left lung densities.

PORTABLE CHEST - 1 VIEW

[AP]
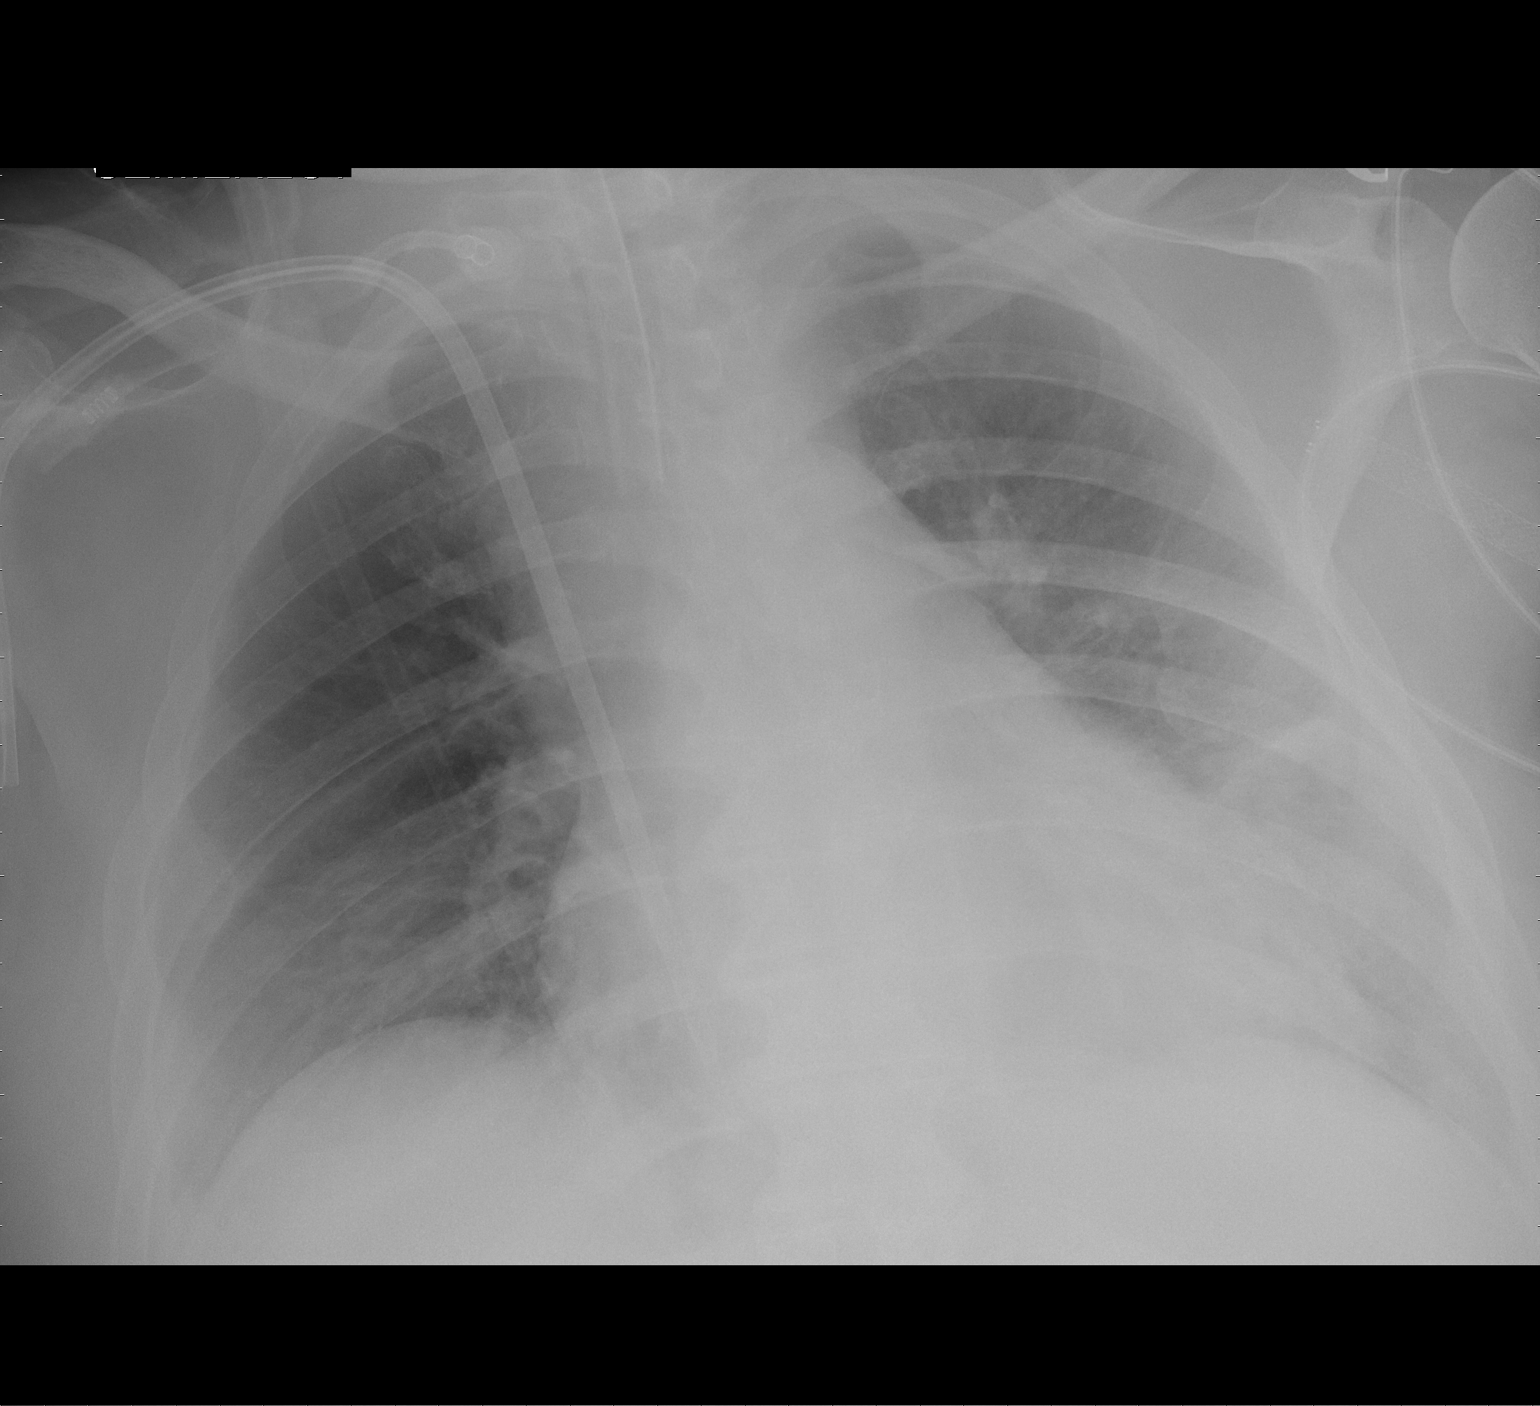

[1 of 1 positions shown; findings below may reference images not displayed]

FINDINGS: Endotracheal tube is 3.3 cm above the carina.  Dialysis
catheter in the right atrium.  The densities in the left upper lung
have resolved.  There are new densities in the left mid and lower
lung.  Right lung remains clear without focal airspace disease.
Heart size is within normal limits.
IMPRESSION: Improved aeration in the left upper lung.  New densities in the
left lower lung.  Findings are probably related to shifting
atelectasis and possibly mucous plugging.  Overall, there is
improved aeration in the left lung.

## 2013-04-30 IMAGING — CT CT ABD-PELV W/O CM
2 of 4 series · 13 of 32 positions shown, 18 images · non-contrast
Comparison: Abdominal pelvic CT 03/28/2009.

CLINICAL DATA: Low pelvic pain.  Sacral decubitus ulcer. Renal
failure.  Question abscess.

CT ABDOMEN AND PELVIS WITHOUT CONTRAST
TECHNIQUE: Multidetector CT imaging of the abdomen and pelvis was
performed following the standard protocol without intravenous
contrast.

[Series 2: routine abdomen · axial · 0.90mm/px · z∈[-484,-84]mm · 8 of 104 slices shown, 13 images]
[im 12/104  soft-tissue]
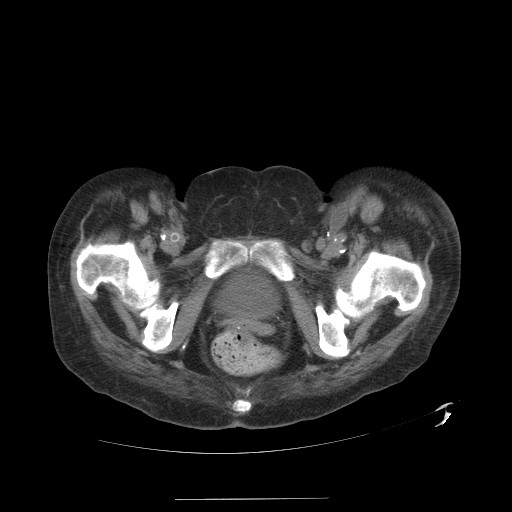
[im 12/104  bone]
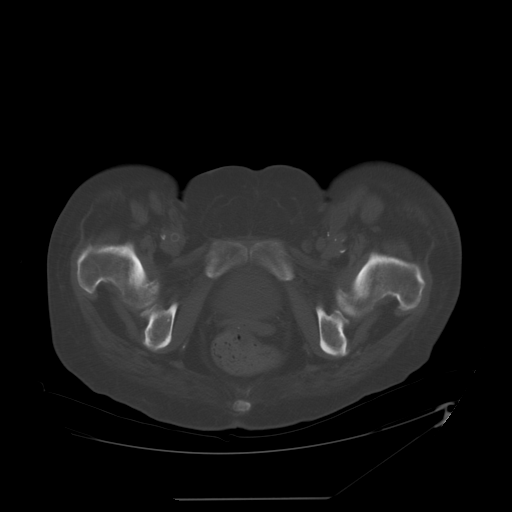
[im 23/104  soft-tissue]
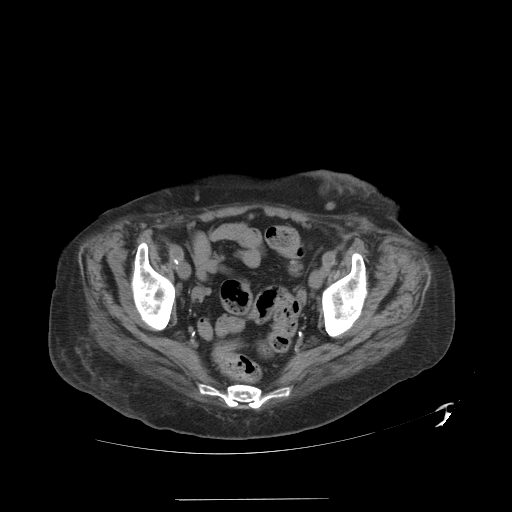
[im 35/104  soft-tissue]
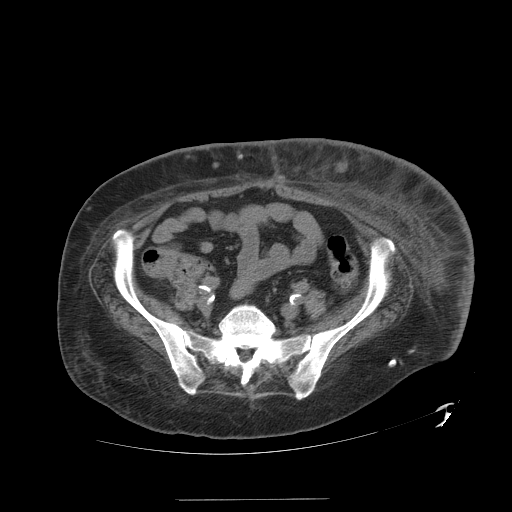
[im 46/104  soft-tissue]
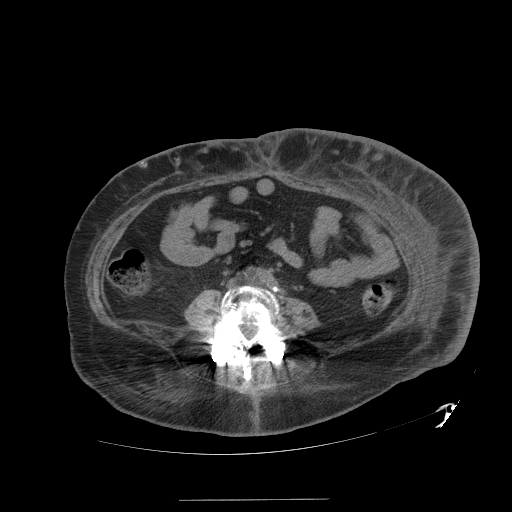
[im 58/104  soft-tissue]
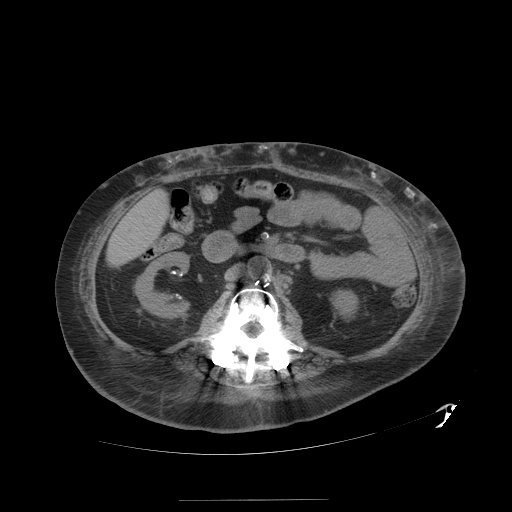
[im 58/104  lung]
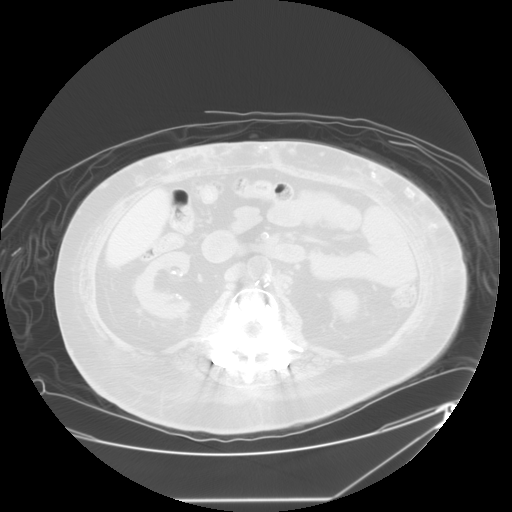
[im 69/104  soft-tissue]
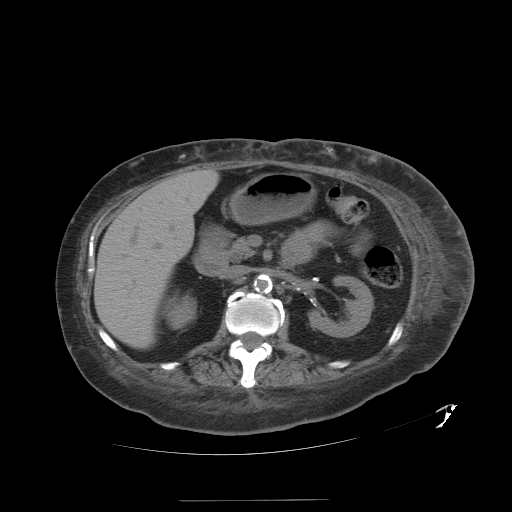
[im 69/104  lung]
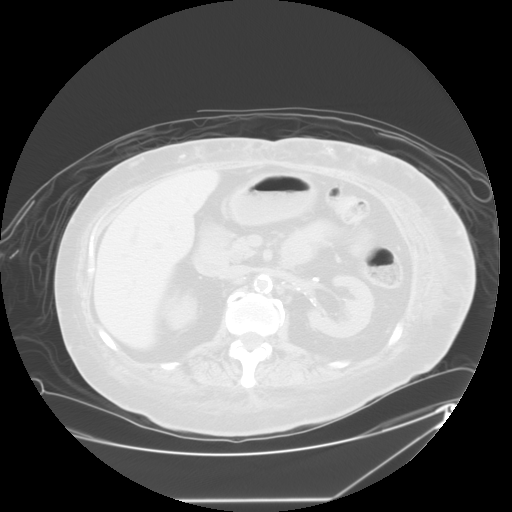
[im 81/104  soft-tissue]
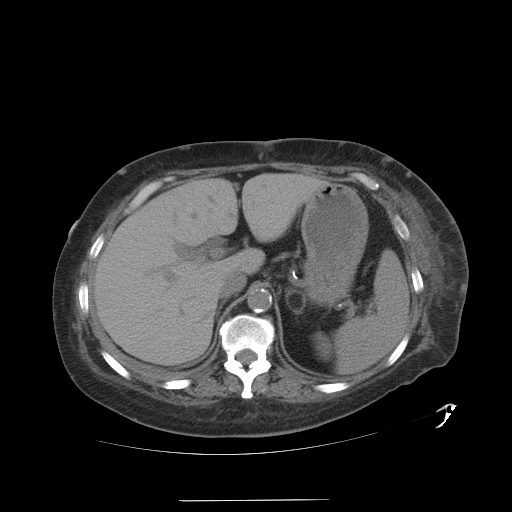
[im 81/104  lung]
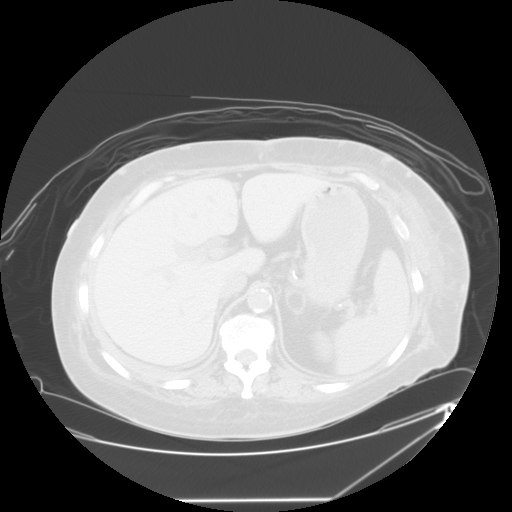
[im 92/104  soft-tissue]
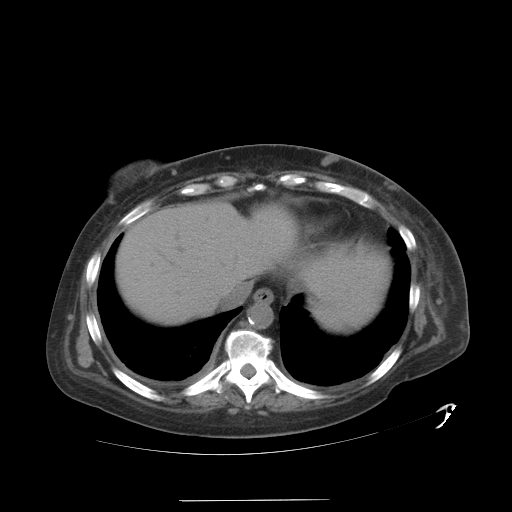
[im 92/104  lung]
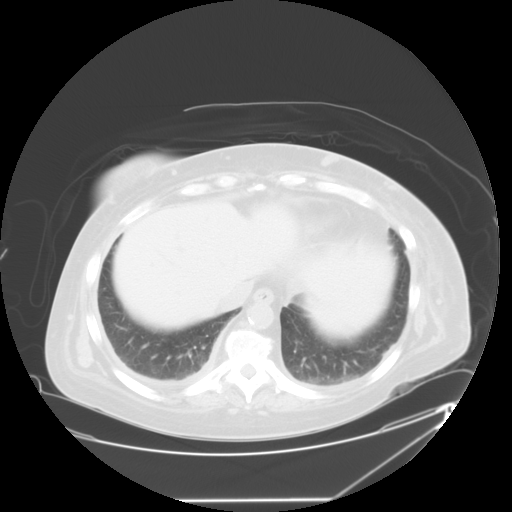

[Series 401: sagittal · sagittal · 1.03mm/px · 5 of 129 slices shown]
[im 12/129  soft-tissue]
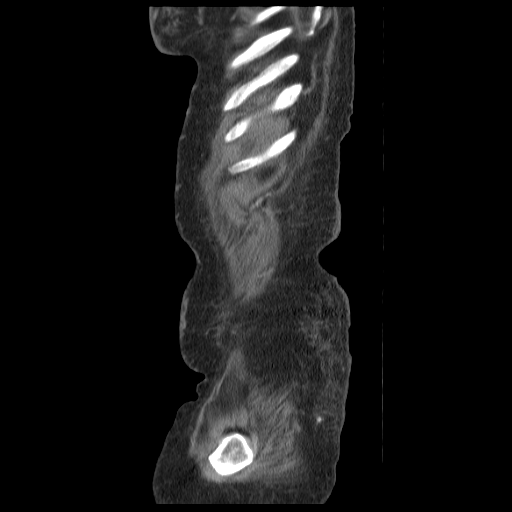
[im 24/129  soft-tissue]
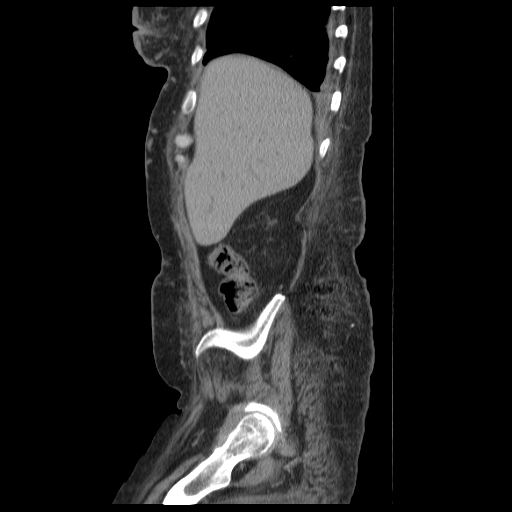
[im 47/129  soft-tissue]
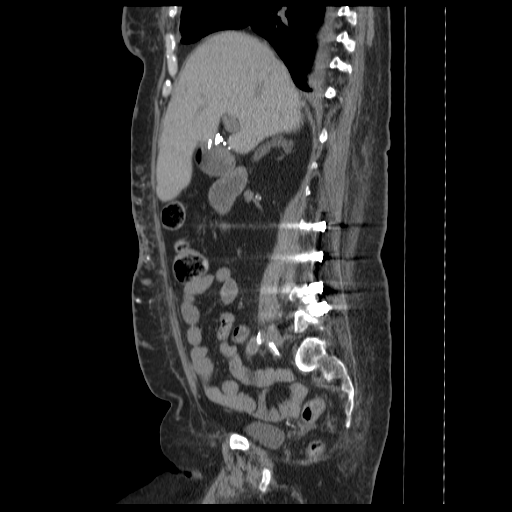
[im 59/129  soft-tissue]
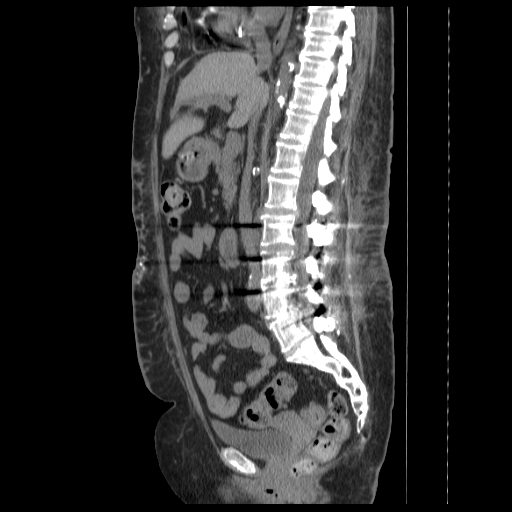
[im 70/129  soft-tissue]
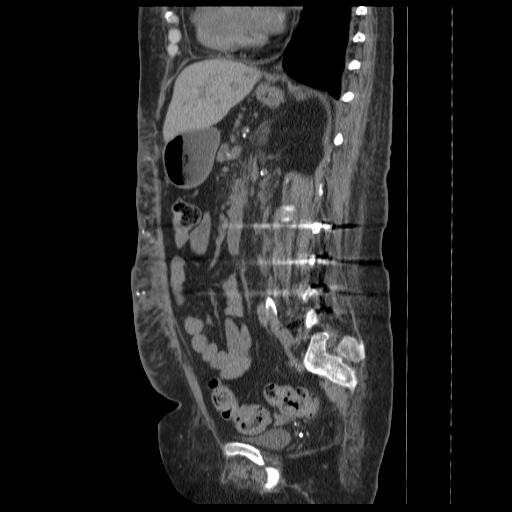

[13 of 32 positions shown; findings below may reference images not displayed]

FINDINGS: There are small bilateral pleural effusions.  There is
mild atelectasis at both lung bases.  There are multiple
calcifications within the subcutaneous fat of both buttocks and the
anterior abdominal wall, presumably secondary to injections.  There
is asymmetric soft tissue stranding within the subcutaneous fat of
the left anterior abdominal wall extending superiorly into the left
breast.  No focal fluid collection is evident.  There is minimal
subcutaneous edema within the right buttock.  There is no obvious
sacral decubitus ulcer or associated fluid collection.

There is no evidence of sacral or ischial bone destruction.  There
are postsurgical changes status post L2-L5 fusion.  The hardware
appears intact without loosening.

As evaluated in the noncontrast state, the liver and spleen appear
normal.  The pancreas is diffusely atrophied.  There is stable mild
biliary dilatation status post cholecystectomy.  There are stable
bilateral adrenal nodules.  The larger nodule on the left is
associated with macroscopic fat and most consistent with a
myelolipoma.

There has been interval progression in bilateral renal cortical
atrophy.  No hydronephrosis is seen.

There is extensive atherosclerosis with involvement of the coronary
arteries, aorta and visceral arteries.  The patient is status post
interval aorto-iliac bypass.  There is no evidence of
retroperitoneal hematoma.

There is no evidence of bowel obstruction, adenopathy or
extraluminal fluid collection.  The uterus, ovaries and bladder
appear normal.
IMPRESSION: 1.  No obvious sacral decubitus ulcer, associated abscess or
osteomyelitis.  Correlate clinically.
2.  There is extensive asymmetric subcutaneous edema throughout the
left anterior abdominal wall extending into the breast.  This may
reflect cellulitis.
3.  Stable left adrenal myelolipoma.
4.  Severe atherosclerosis with progressive bilateral renal
atrophy.  Interval aortobi-iliac grafting.

## 2013-05-22 IMAGING — CR DG CHEST 1V
1 series · 1 of 1 positions shown · non-contrast
Comparison: Chest radiograph [DATE]

CLINICAL DATA: Hypertension, smoker

CHEST - 1 VIEW

[view not recorded]
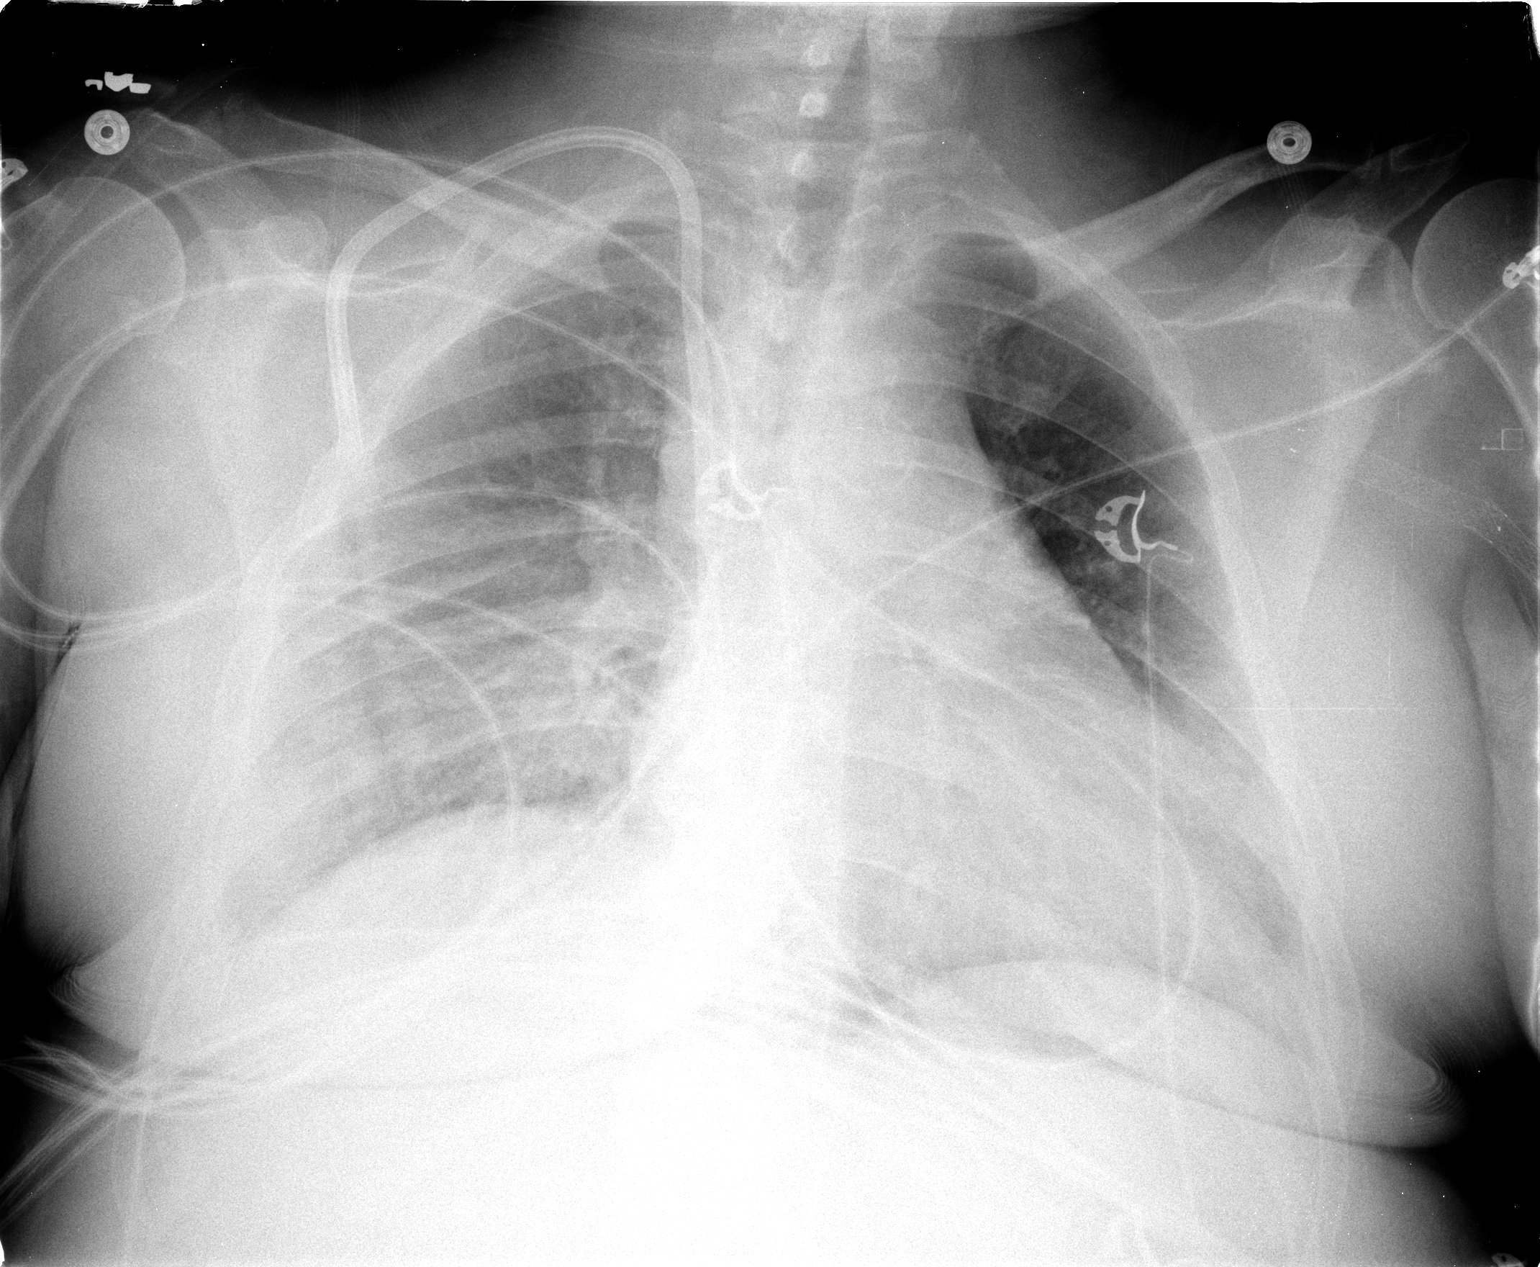

[1 of 1 positions shown; findings below may reference images not displayed]

FINDINGS: Normal cardiac silhouette.  There is a large bore right
central venous line is unchanged.  Interval extubation.  There are
low lung volumes similar to prior.  Bibasilar atelectasis is
unchanged.  No overt pulmonary edema.
IMPRESSION: 1..  Low lung volumes
2.  Bibasilar atelectasis.

## 2013-06-03 IMAGING — CR DG FOOT COMPLETE 3+V*L*
3 series · 3 of 3 positions shown · non-contrast
Comparison: None.

CLINICAL DATA: Left foot pain

LEFT FOOT - COMPLETE 3+ VIEW

[x foot ap left]
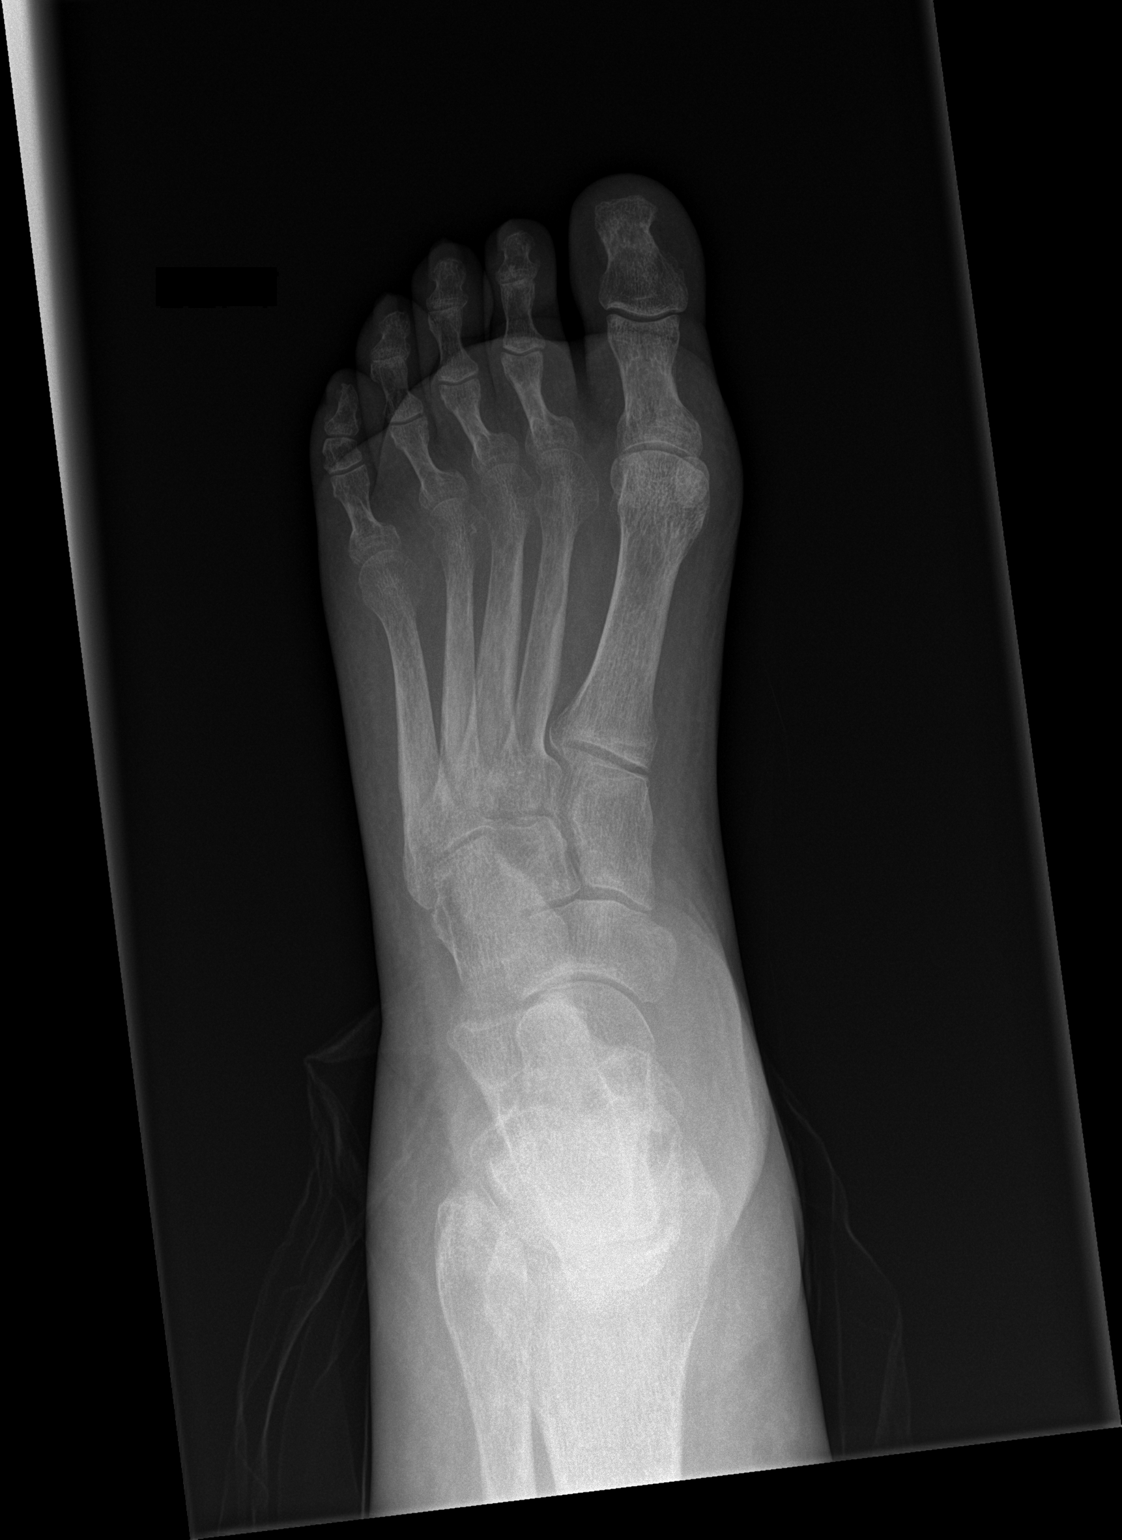

[x foot obl left]
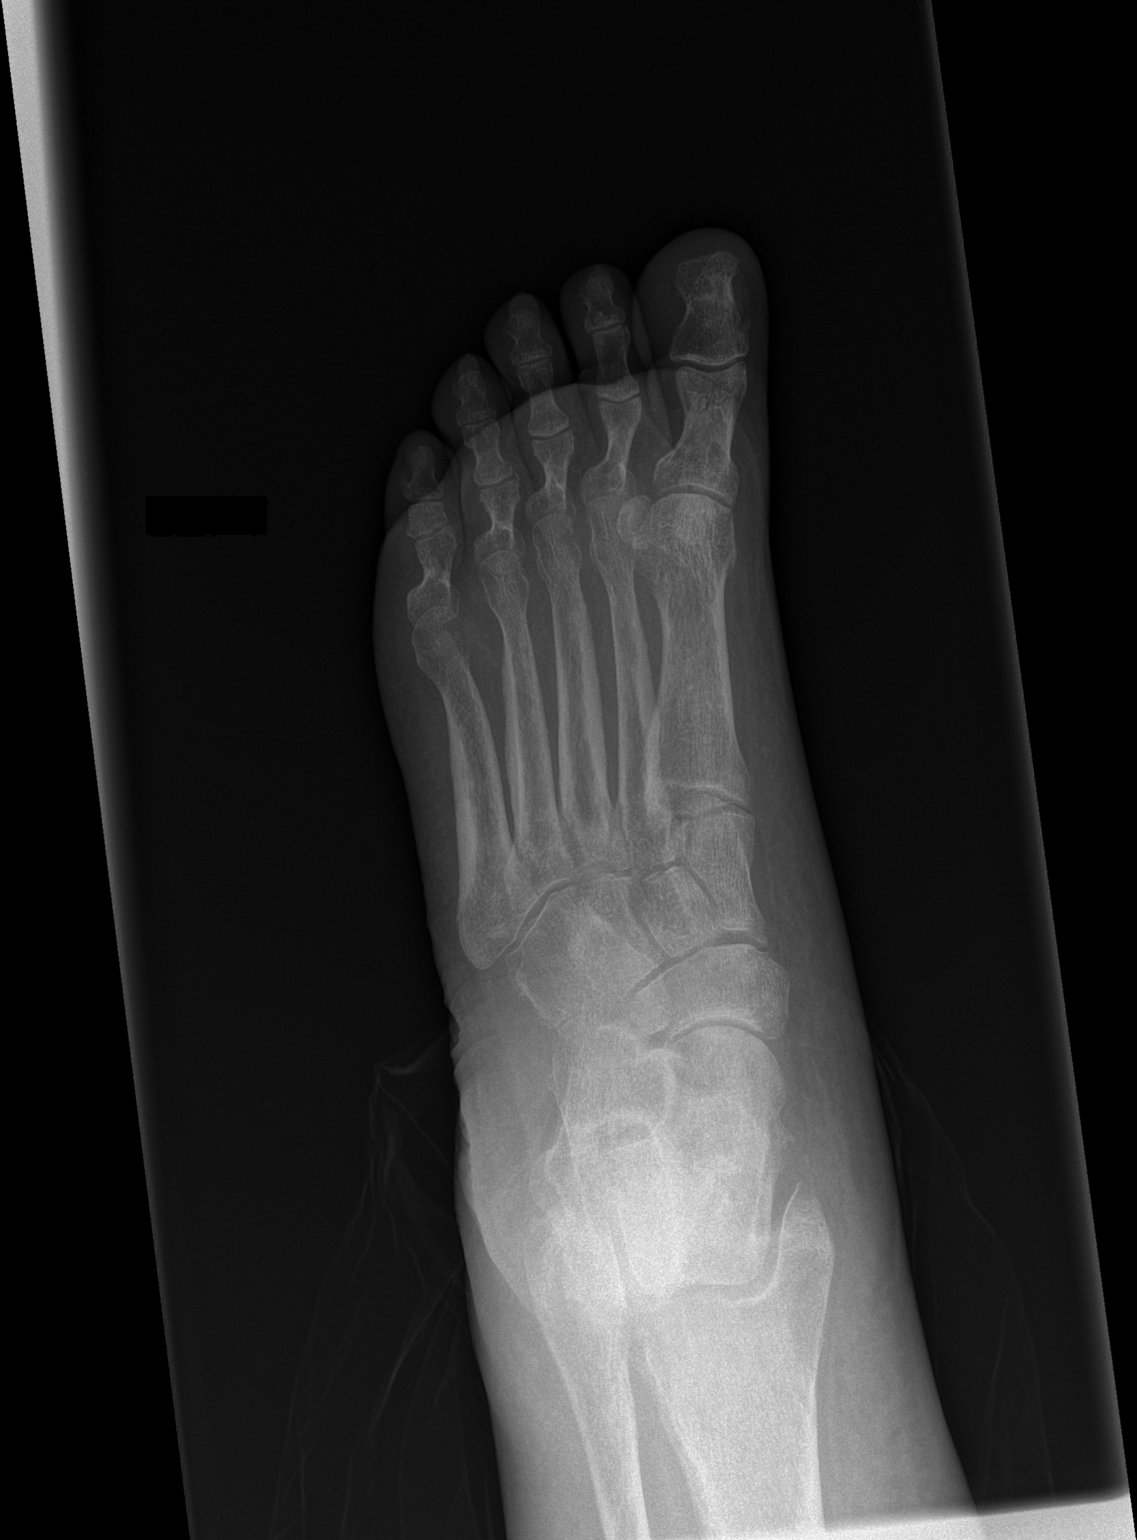

[w foot lat left]
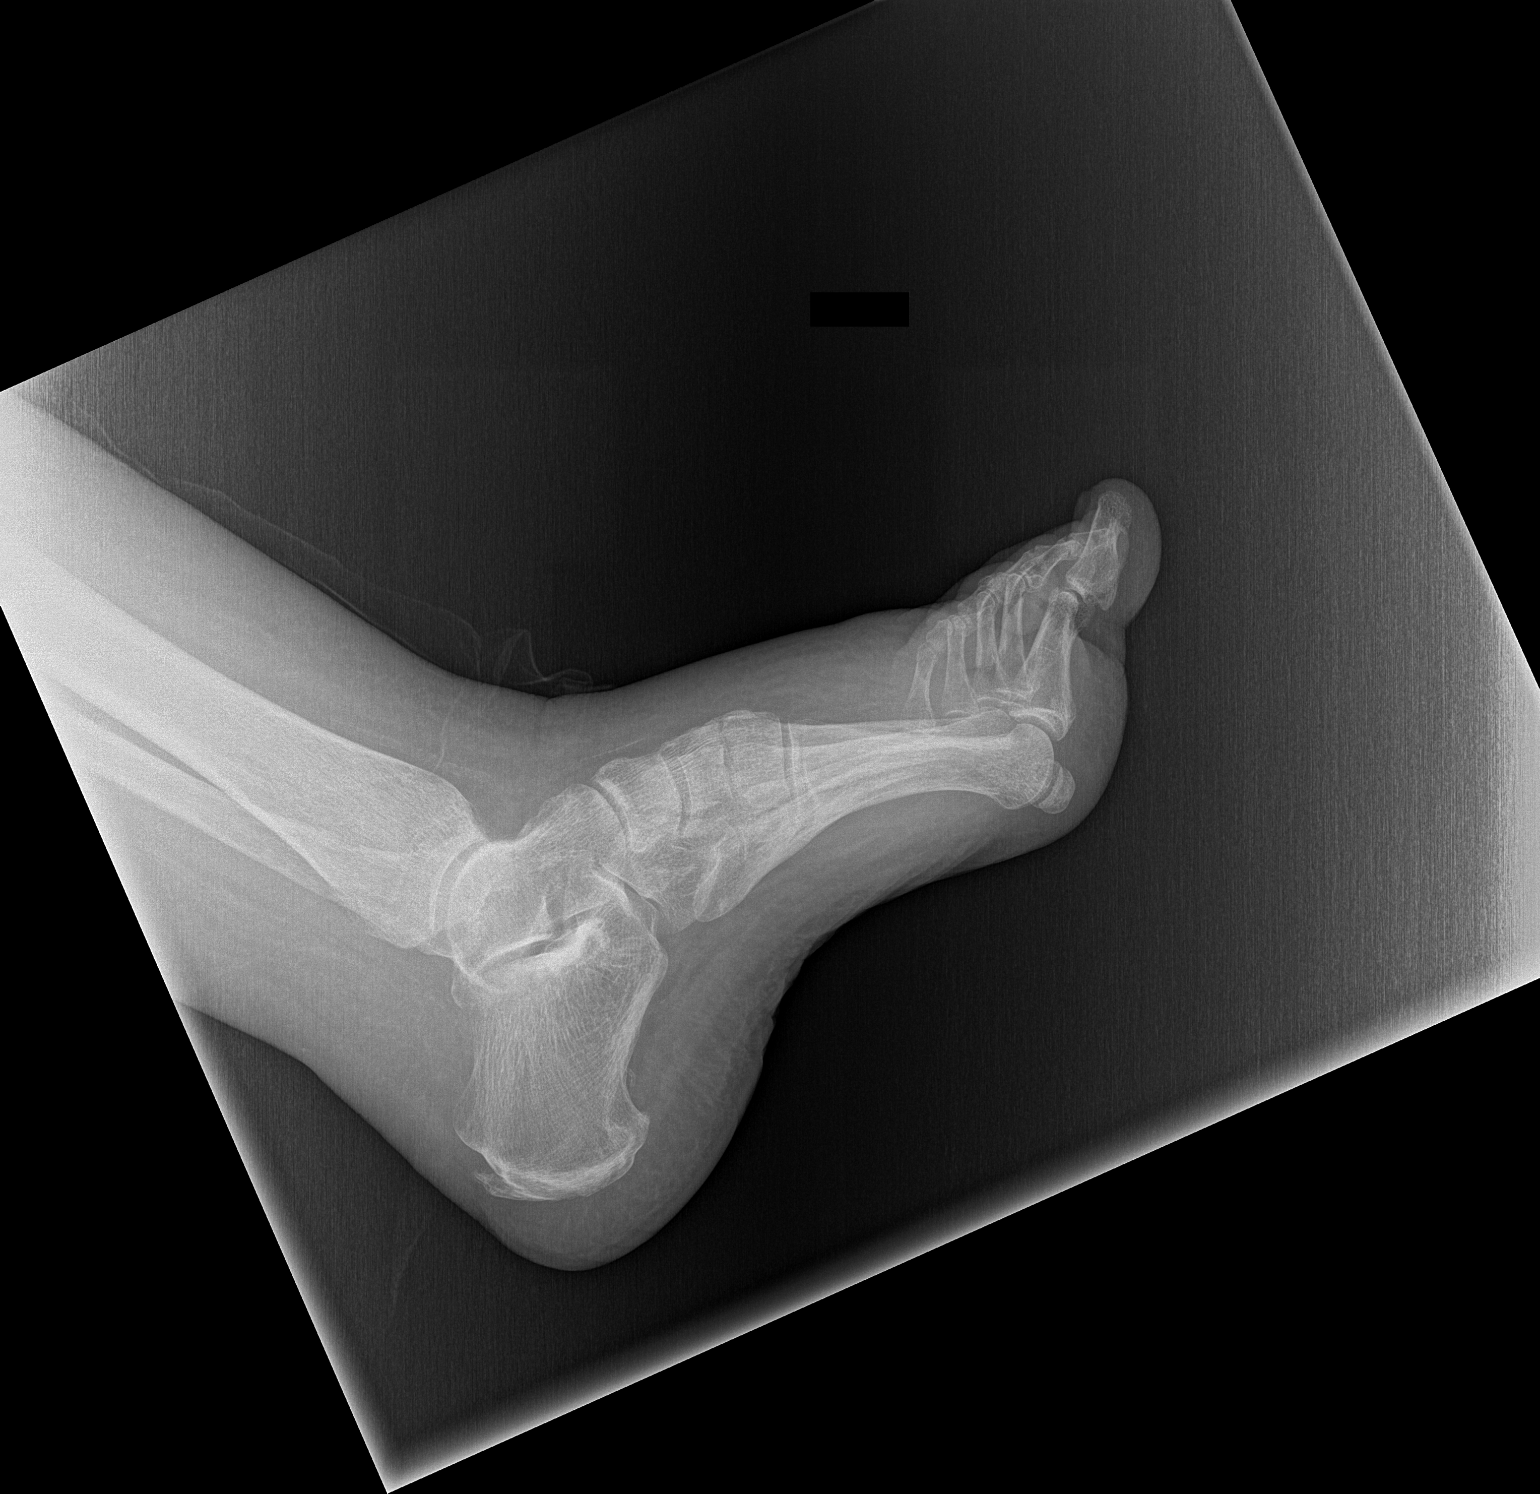

[3 of 3 positions shown; findings below may reference images not displayed]

FINDINGS: Diffuse osteopenia.  No definite fracture.  Joint spaces are
preserved.  No definite erosions.  Kiyohiko Tomatsu deformity suspected on
this nonweightbearing radiograph.  Minimal enthesopathic change of
the Achilles tendon insertion site.  There is mild diffuse soft
tissue swelling about the foot.
IMPRESSION: Mild diffuse soft tissue swelling about the foot without associate
fracture or dislocation.

## 2013-06-15 IMAGING — CR DG CHEST 1V PORT
1 series · 1 of 1 positions shown · non-contrast
Comparison: 06/05/2012

CLINICAL DATA: Hyperglycemia, dialysis patient,

PORTABLE CHEST - 1 VIEW

[AP]
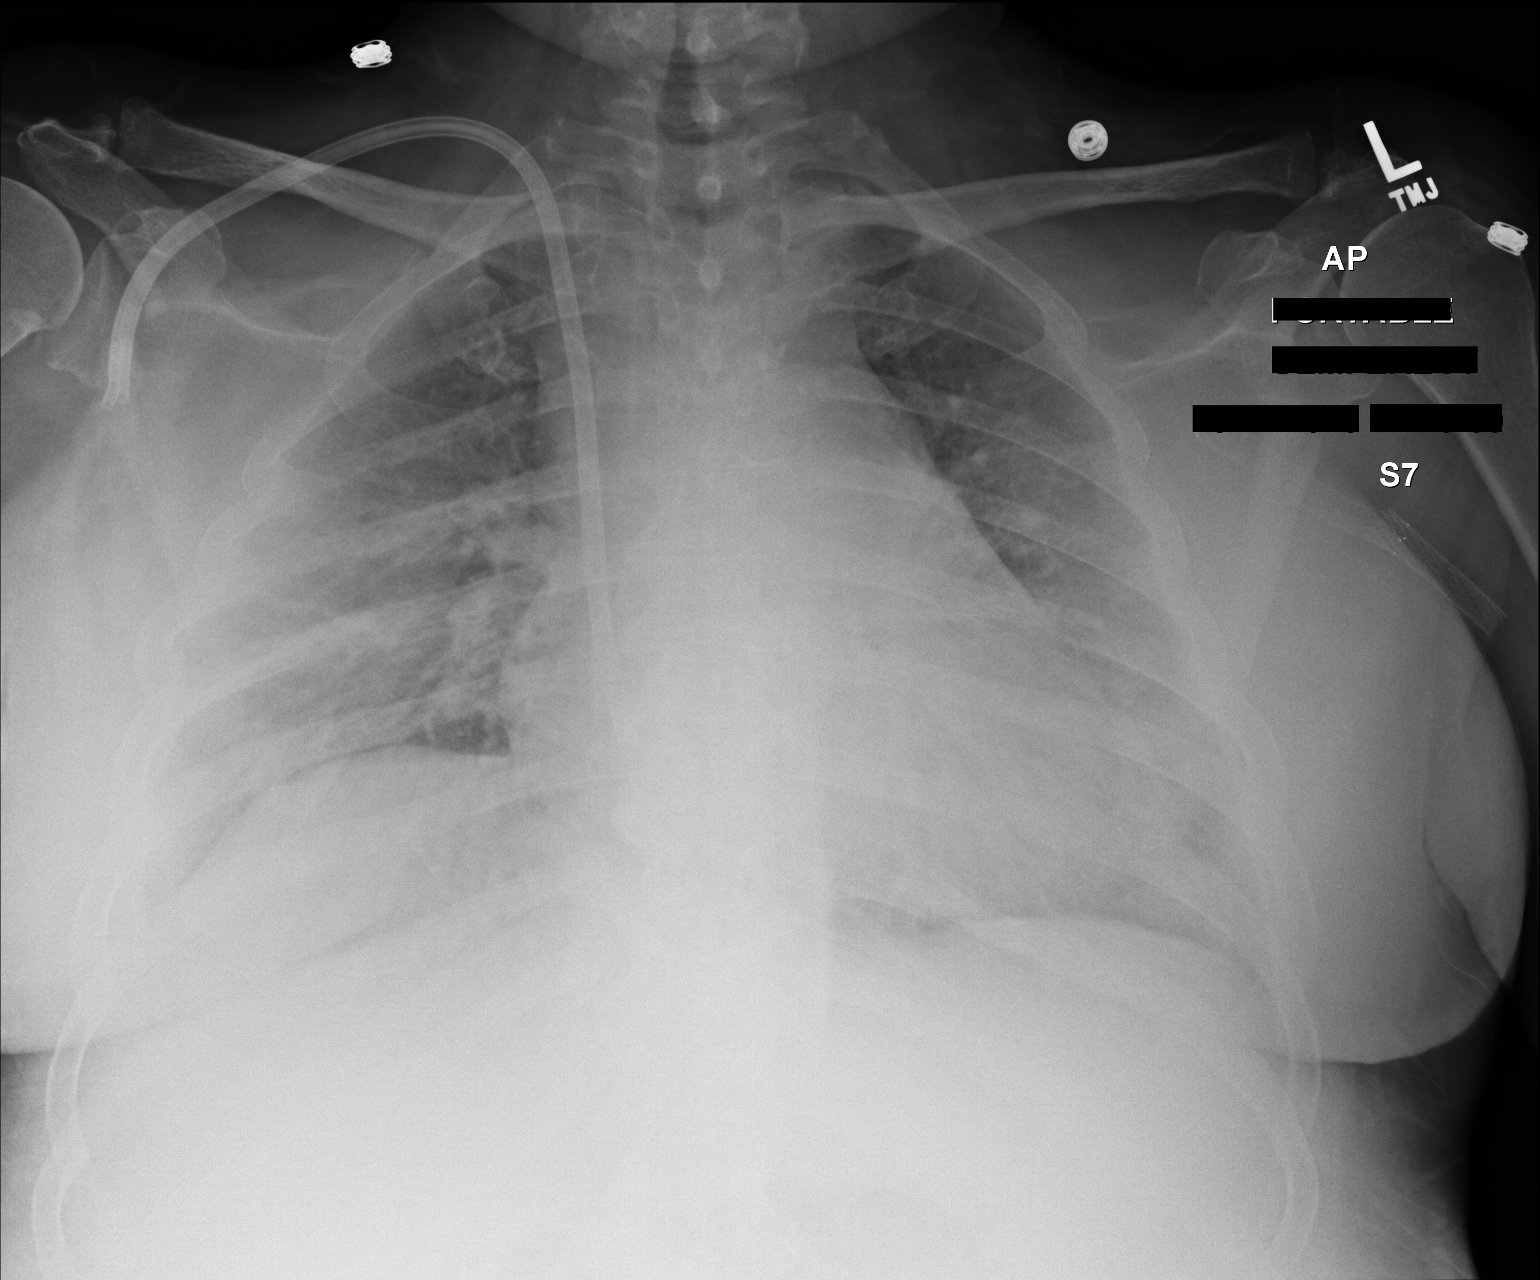

[1 of 1 positions shown; findings below may reference images not displayed]

FINDINGS: Cardiomegaly is noted.  Stable dual lumen right IJ
dialysis catheter position.  Central mild vascular congestion.
Mild interstitial prominence bilaterally.  Mild interstitial edema
cannot be excluded.  No focal infiltrate.
IMPRESSION: Stable dual lumen right IJ dialysis catheter position.  Central
mild vascular congestion.  Mild interstitial prominence
bilaterally.  Mild interstitial edema cannot be excluded.  No focal
infiltrate.]

## 2013-06-16 IMAGING — XA IR FLUORO GUIDE CV LINE*R*
1 series · 4 of 4 positions shown · non-contrast
Comparison: none

CLINICAL DATA: Poor function of tunneled hemodialysis catheter.
Contrast allergy.

[Series 1: run · 4 of 4 slices shown]
[im 1/4]
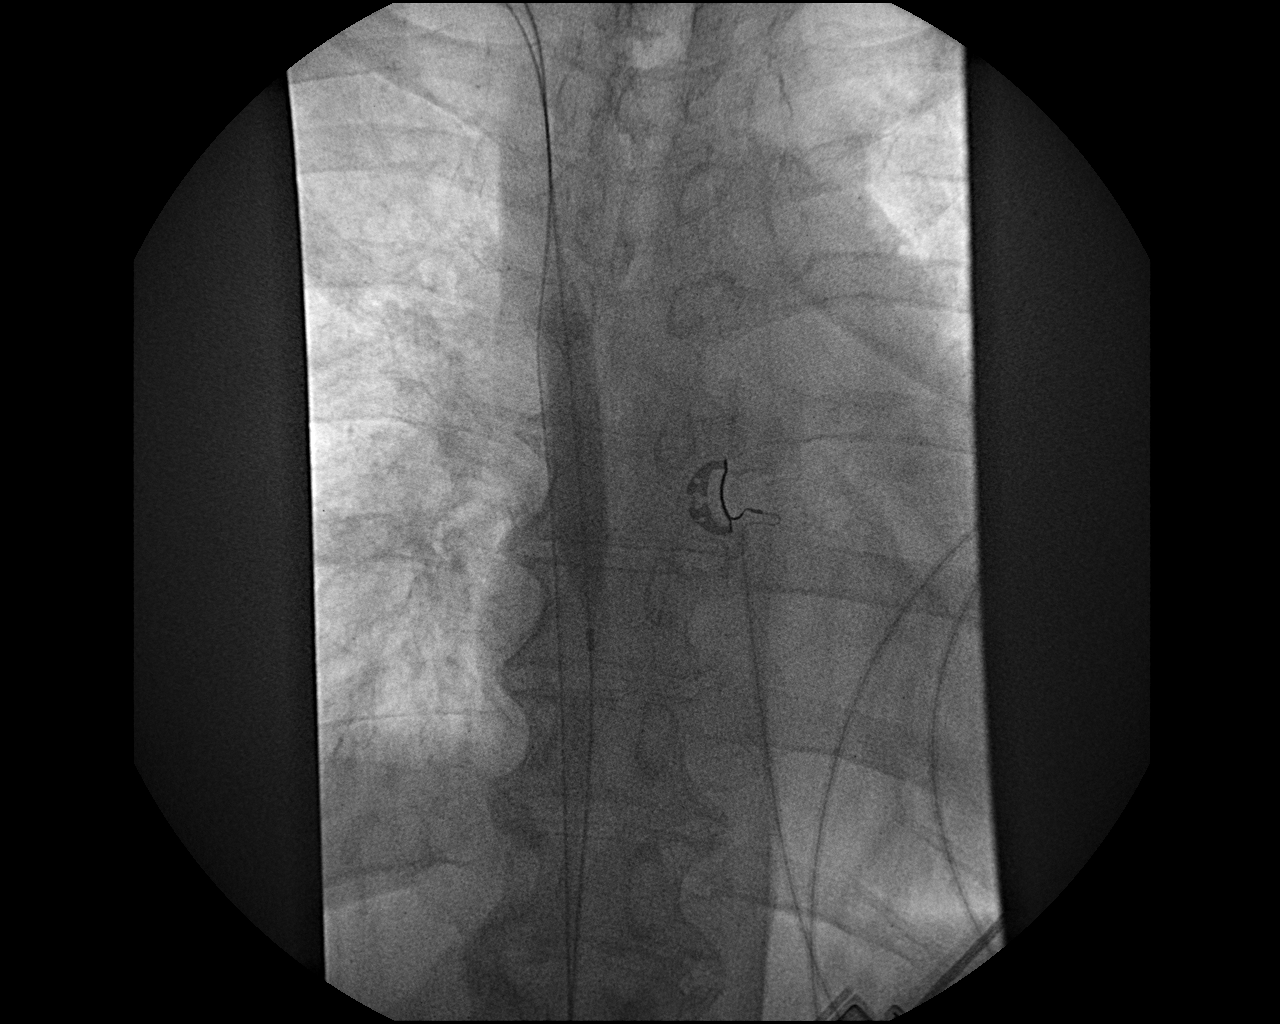
[im 2/4]
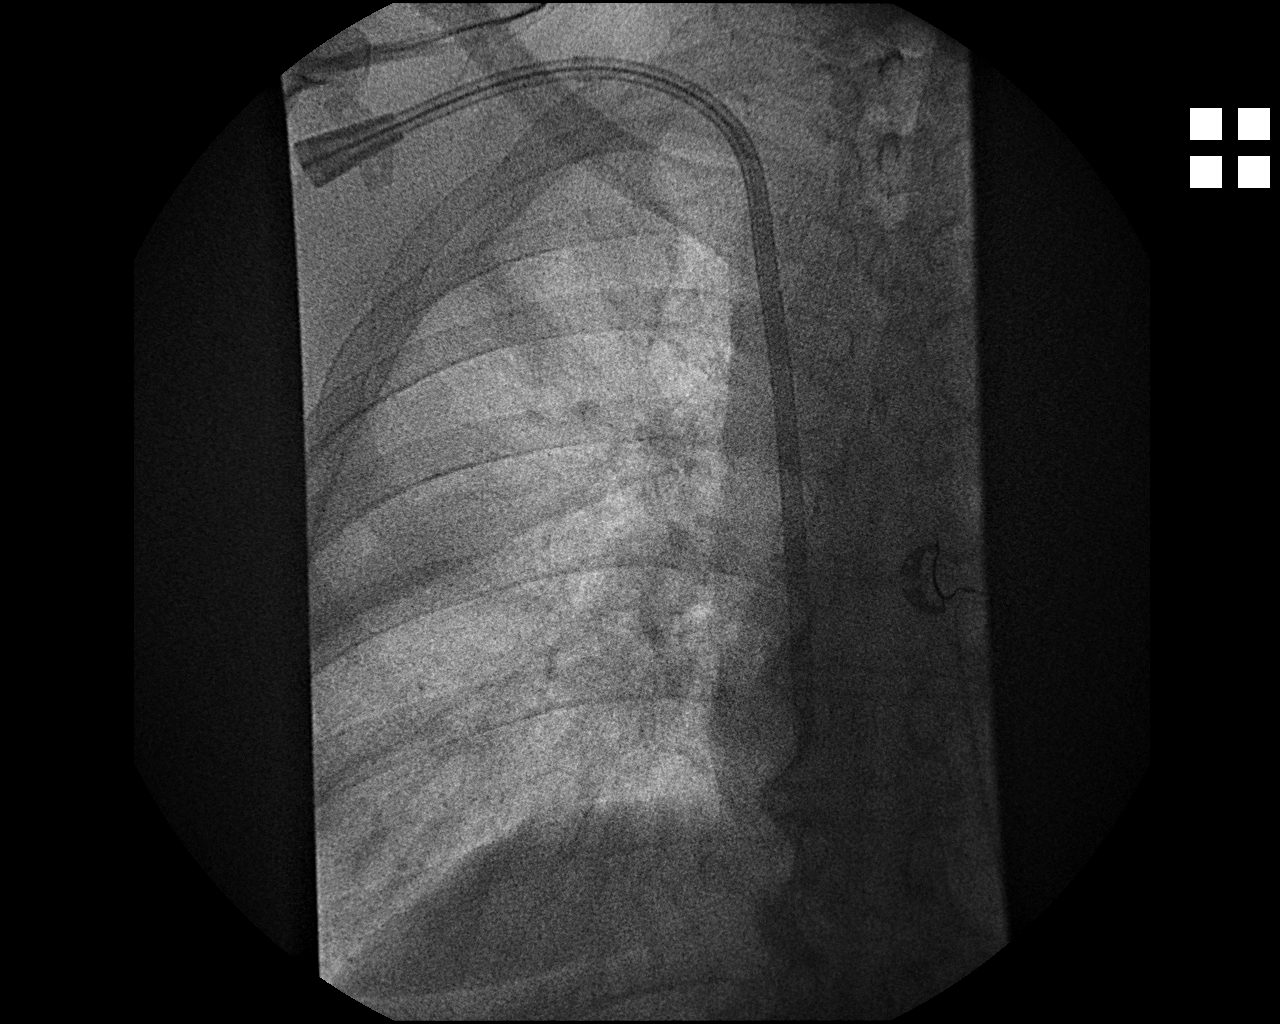
[im 3/4]
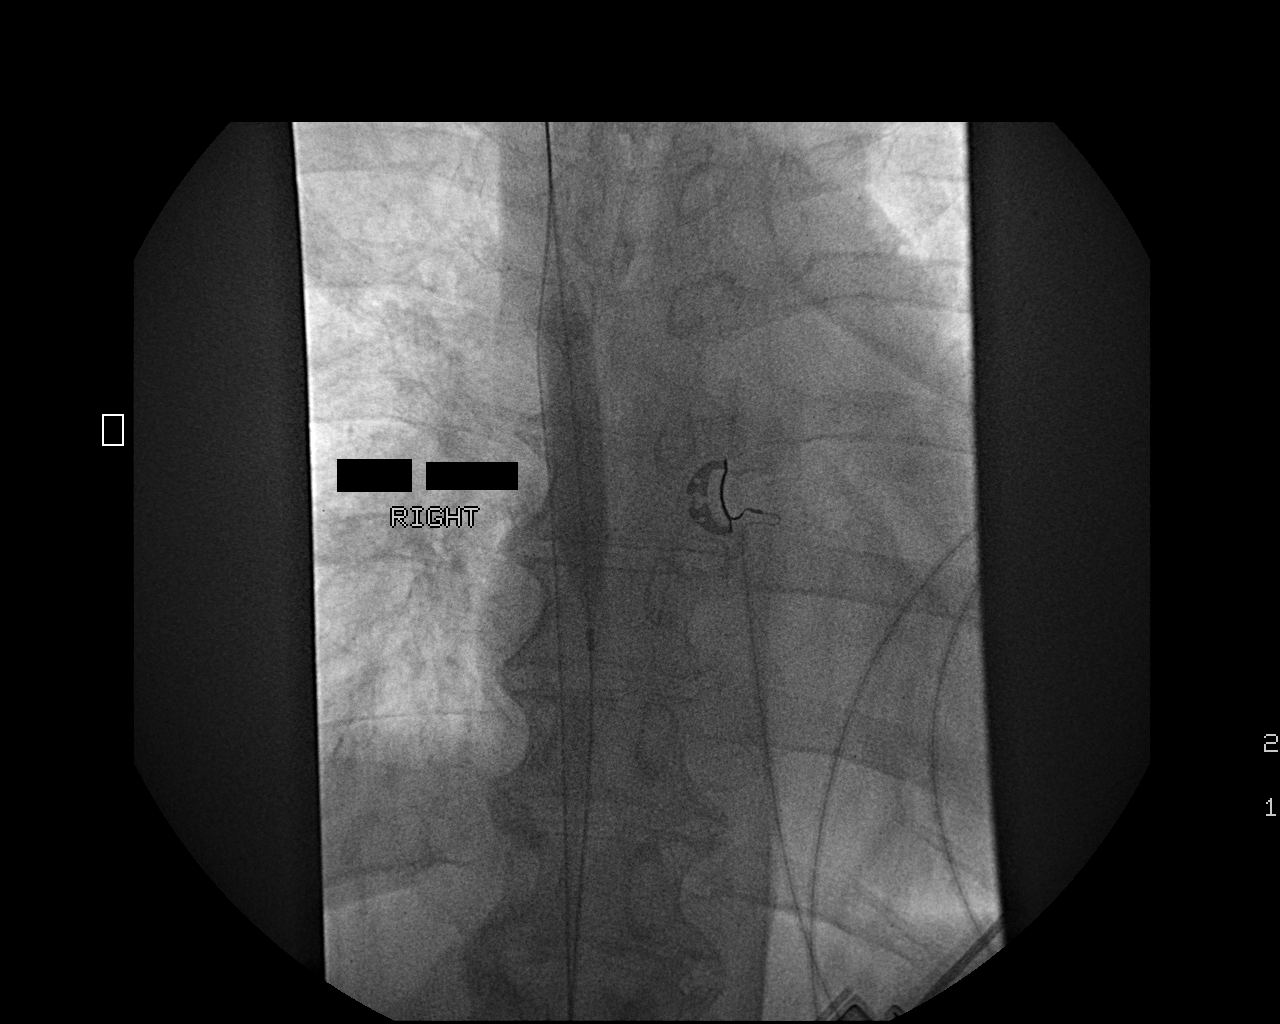
[im 4/4]
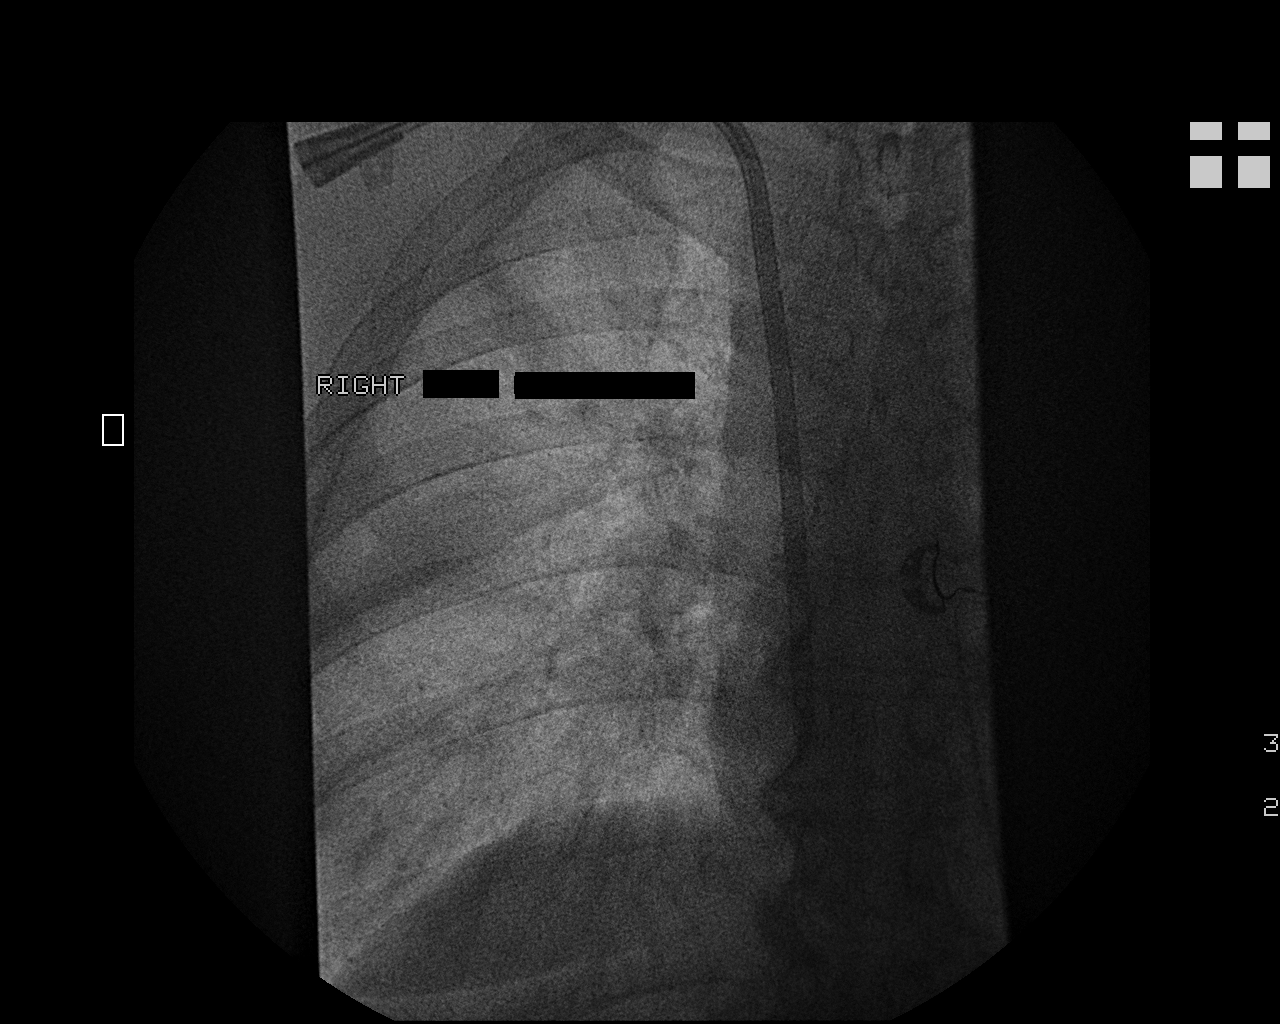

[4 of 4 positions shown; findings below may reference images not displayed]

EXCHANGE AND REVISION OF TUNNELED HEMODIALYSIS CATHETER
VENOUS ANGIOPLASTY

Technique and findings: The procedure, risks (including but not
limited to bleeding, infection, organ damage), benefits, and
alternatives were explained to the patient.  Questions regarding
the procedure were encouraged and answered.  The patient
understands and consents to the procedure.The previously placed
tunneled right IJ hemodialysis catheter was prepped and draped
usual sterile fashion. Maximal barrier sterile technique was
utilized including caps, mask, sterile gowns, sterile gloves,
sterile drape, hand hygiene and skin antiseptic.

As antibiotic prophylaxis, vancomycin 1 gram was ordered pre-
procedure and administered intravenously within one hour of
incision. The skin surrounding the hemodialysis catheter was
infiltrated with 1% lidocaine.  The catheter was partially was
removed over two angled stiff hydrophilic glide wires which had
been advanced through the blue venous return lumen into the IVC.
Override of the catheters, a 12 mm x 4 cm Atlas angioplasty balloon
was advanced to the SVC and used to dilate any residual fibrin
sheath and SVC with stenosis. The balloon could pass easily through
this segment to the right atrium and IVC indicating wide patency of
the venous segment. A  19cm Equistream hemodialysis catheter was
then advanced through the tract over  the guide wires and
positioned with its tips in the proximal right atrium.  Spot chest
radiograph confirms appropriate catheter position.  Both lumens
flush and aspirate easily.  The catheter was secured externally
with O-Prolene sutures and flushed per protocol. The patient
tolerated the procedure well.  No immediate complication.
IMPRESSION: 1.  Technically successful exchange of tunneled hemodialysis
catheter
2.  Technically successful 12 mm balloon angioplasty of   SVC
stenosis and fibrin sheath.

## 2013-06-17 IMAGING — CR DG CHEST 1V PORT
1 series · 1 of 1 positions shown · non-contrast
Comparison: Earlier same day; 06/17/2012

CLINICAL DATA: Right internal jugular line placement

PORTABLE CHEST - 1 VIEW

[AP]
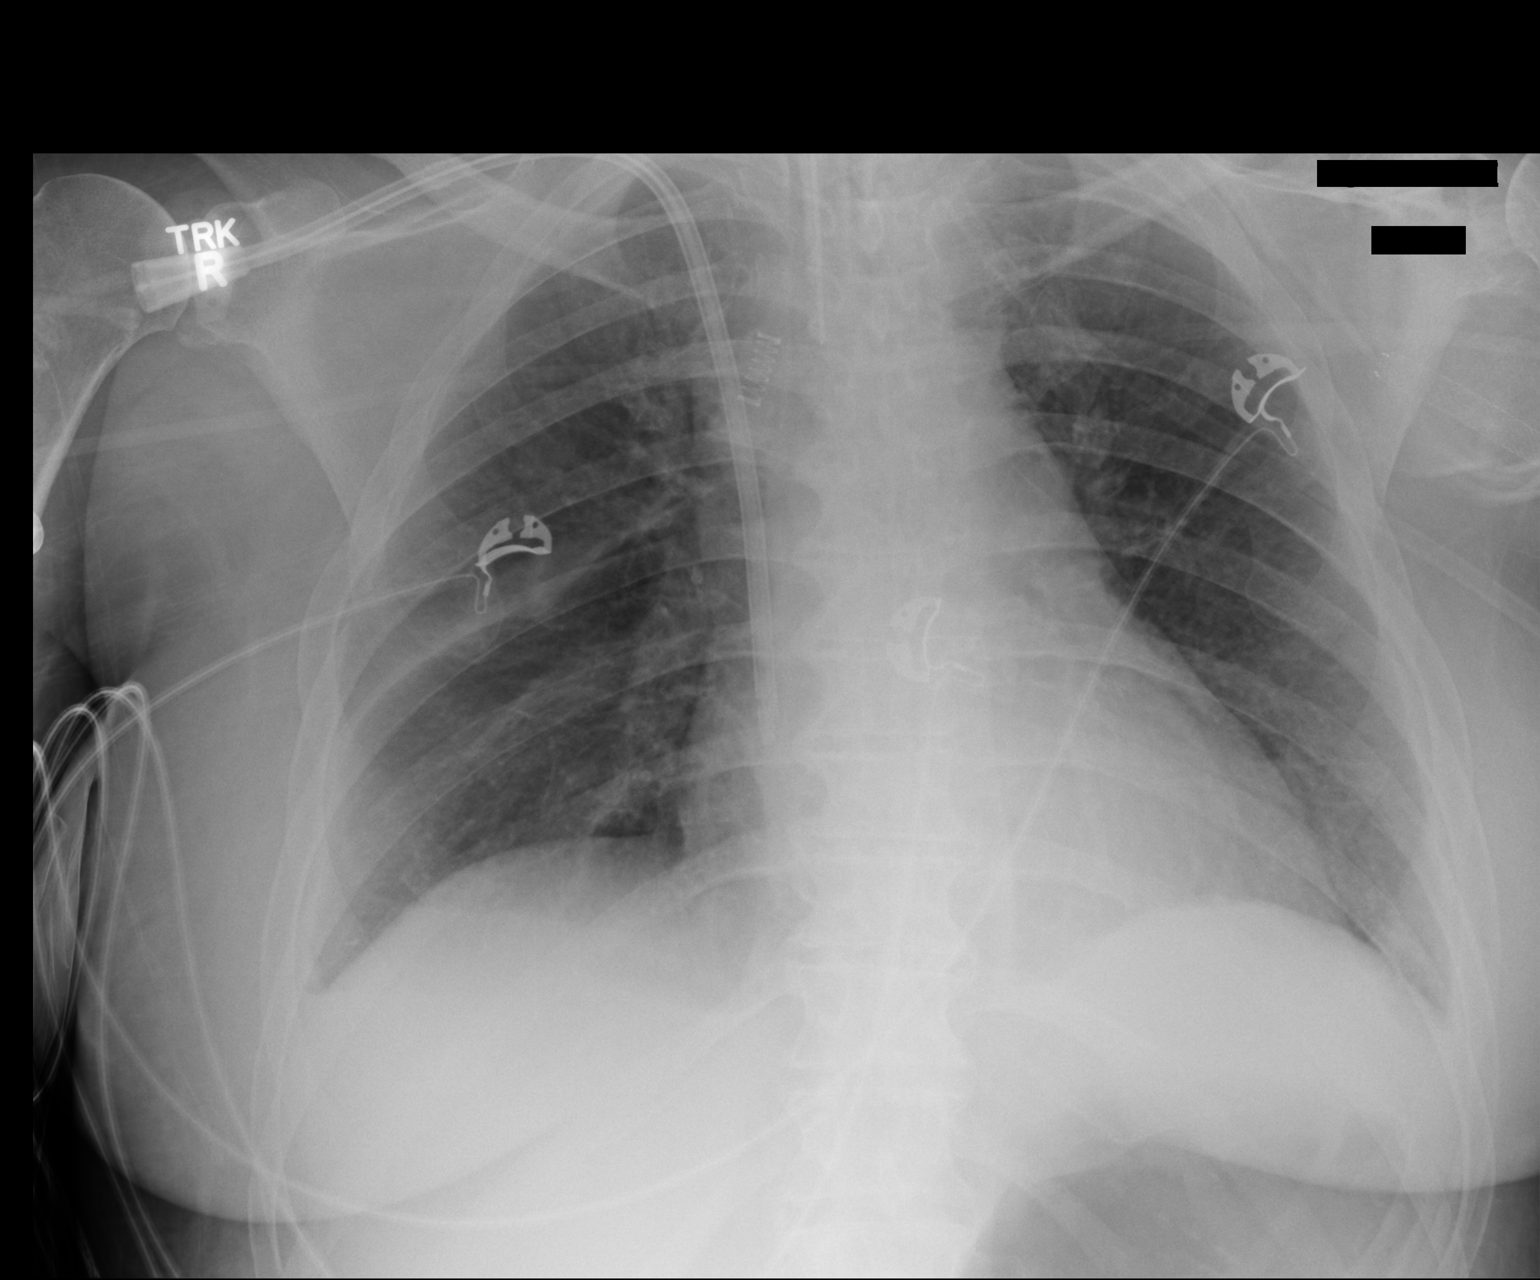

[1 of 1 positions shown; findings below may reference images not displayed]

FINDINGS: Grossly unchanged enlarged cardiac silhouette and mediastinal
contours.  A linear vertically oriented structure overlies the
inferior aspect of the right neck and may represent the distal end
of the reported right jugular line placement.  Interval removal of
external pacing pads.  Otherwise, stable positioning of support
apparatus.

Pulmonary vasculature remains indistinct.  Fluid is again seen
within the minor fissure.  Small bilateral effusions are again
suspected. No new focal airspace opacities.  No definite
pneumothorax.  Unchanged bones.
IMPRESSION: 1.  Right jugular approach central venous catheter tip is high-
riding, overlying the inferior aspect the right lower neck.
Advancement is advised.
2.  Otherwise, stable positioning of support apparatus.  No
pneumothorax.
3.  Grossly unchanged findings of mild pulmonary edema and small
bilateral effusions.

## 2013-06-17 IMAGING — CR DG CHEST 1V PORT
1 series · 1 of 1 positions shown · non-contrast
Comparison: 06/18/2012 and earlier.

CLINICAL DATA: 41-year-old female with respiratory arrest.  Central
line placement, query pneumothorax.

PORTABLE CHEST - 1 VIEW

[AP]
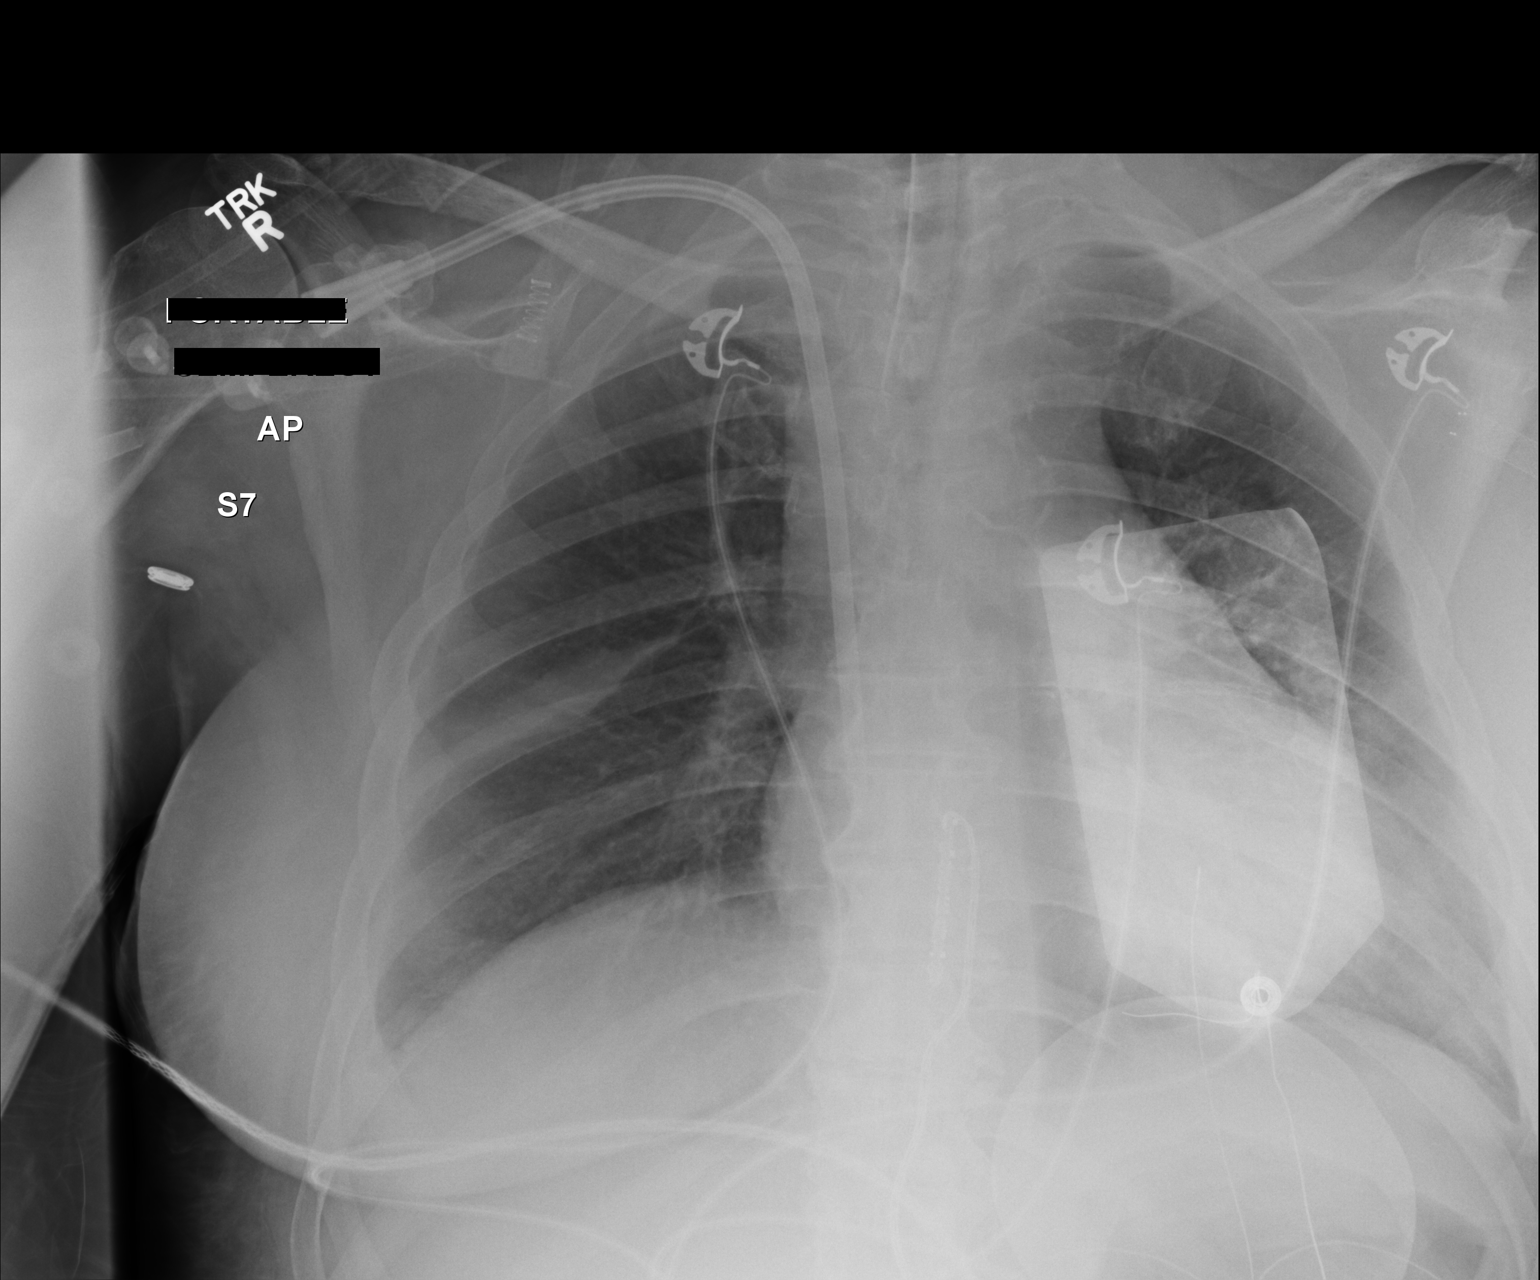

[1 of 1 positions shown; findings below may reference images not displayed]

FINDINGS: Portable semi upright AP view [id number] hours.  Dual lumen
right IJ approach central venous catheter in place.  No evidence of
right pneumothorax.  Endotracheal tube now in place, tip between
level of clavicles and carina.  Resuscitation pad on the left
chest.  Improved lung volumes since 06/17/2012.  Probable fluid in
the right minor fissure.  Patchy opacity at the left base.  No
overt pulmonary edema.  Stable cardiac size and mediastinal
contours.
IMPRESSION: 1.  Endotracheal tube tip in good position.  Stable dual lumen
right IJ central line with no associated pneumothorax.
2.  Small right pleural effusion suspected.  Patchy nonspecific
opacity at the left base.

## 2013-06-18 IMAGING — XA IR FLUORO GUIDE CV LINE*R*
1 series · 2 of 2 positions shown · non-contrast
Comparison: none

CLINICAL DATA: End-stage renal failure.  Poor function of tunneled
hemodialysis catheter.

[Series 1: run · 2 of 2 slices shown]
[im 1/2]
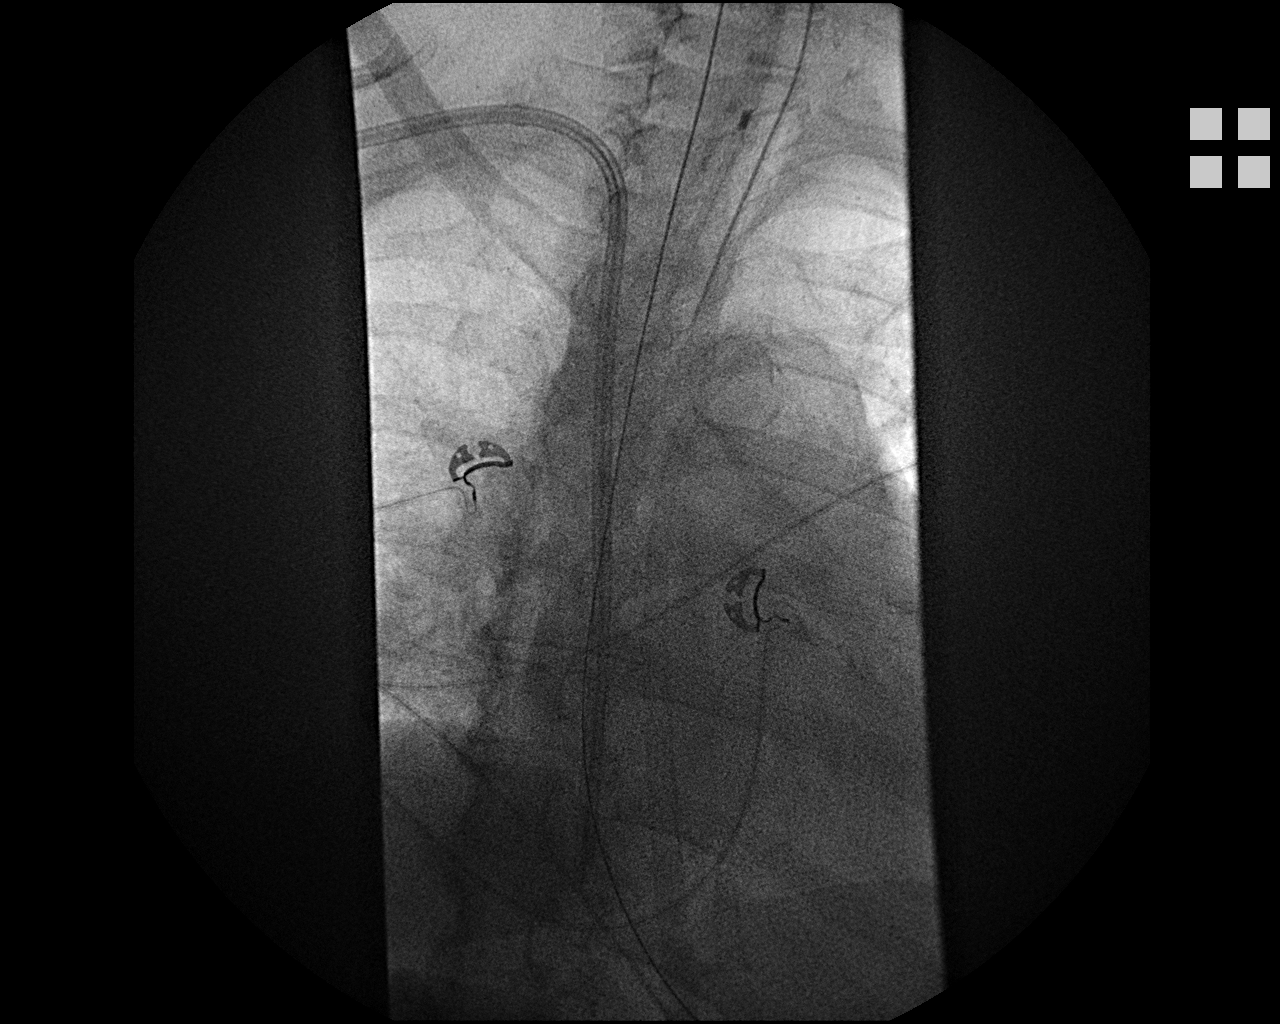
[im 2/2]
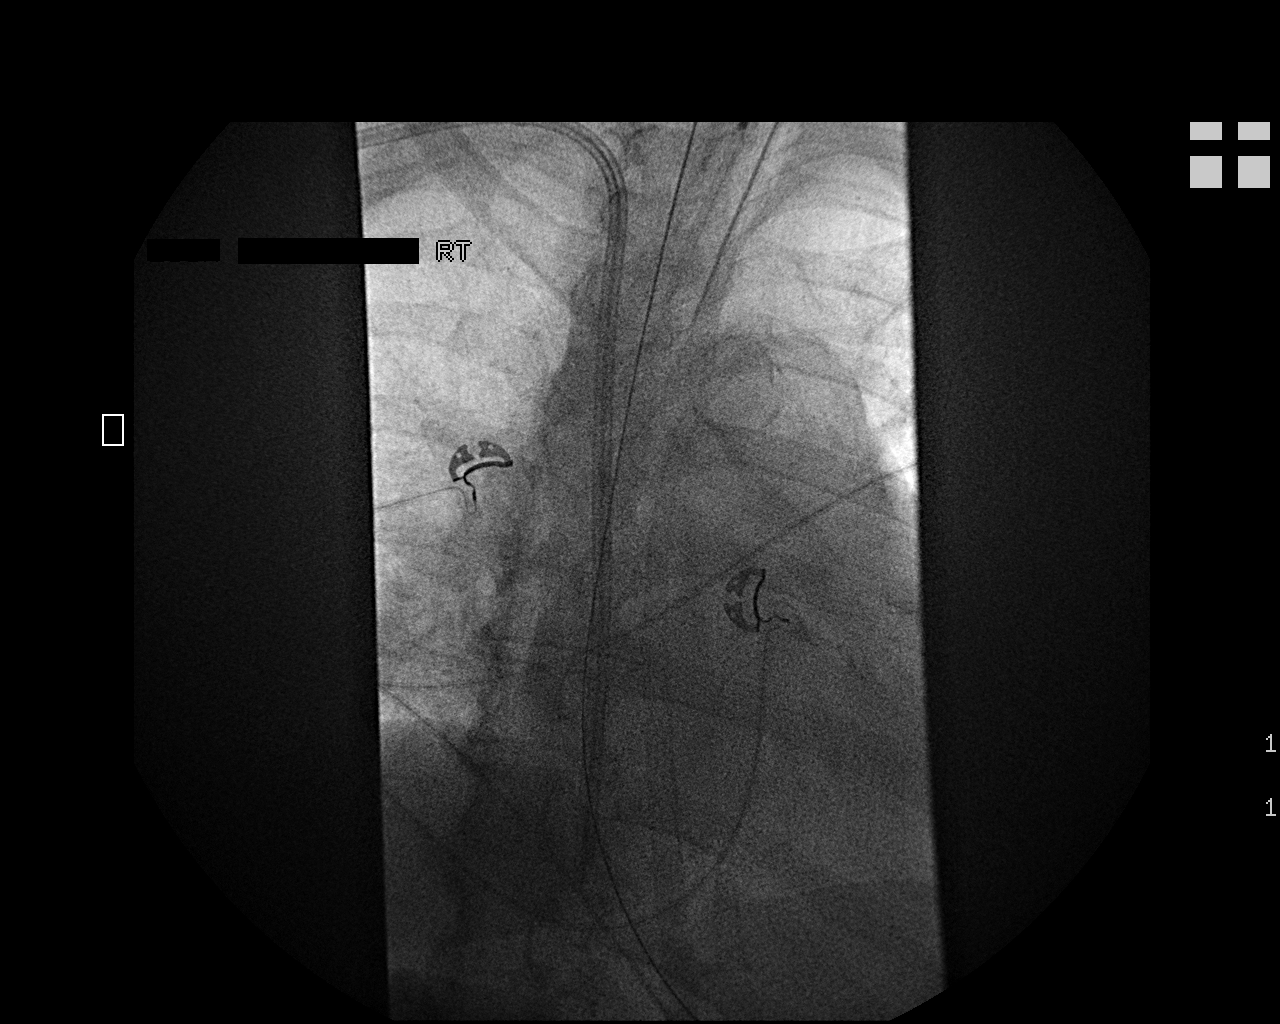

[2 of 2 positions shown; findings below may reference images not displayed]

EXCHANGE AND REPOSITIONING OF TUNNELED HEMODIALYSIS CATHETER UNDER
FLUOROSCOPY

Technique and findings: The procedure, risks (including but not
limited to bleeding, infection, organ damage), benefits, and
alternatives were explained to the patient.  Questions regarding
the procedure were encouraged and answered.  The patient
understands and consents to the procedure.The patient was already
receiving adequate antibiotic coverage.  Site was marked, prepped
with Betadine, draped in usual sterile fashion, infiltrated locally
with 1% lidocaine. Maximal barrier sterile technique was utilized
including caps, mask, sterile gowns, sterile gloves, sterile drape,
hand hygiene and skin antiseptic.  Under intermittent fluoroscopic
guidance, the previously placed tunneled right IJ hemodialysis
catheter was exchanged   over a stiff glide wire for a new 23 cm
Equistream hemodialysis catheter, positioned with its tip in the
proximal right atrium.  Spot chest radiograph confirms appropriate
position.  Both lumens flush and aspirate easily.  The catheter was
secured externally with O-Prolene suture and flushed per protocol.
The patient tolerated the procedure well.  No immediate
complication.
IMPRESSION: Technically successful exchange and revision of right IJ tunneled
hemodialysis catheter, ready for routine use.

## 2013-06-18 IMAGING — CR DG CHEST 1V PORT
1 series · 1 of 1 positions shown · non-contrast
Comparison: One-view chest 06/19/2012.

CLINICAL DATA: Endotracheal tube position.

PORTABLE CHEST - 1 VIEW

[AP]
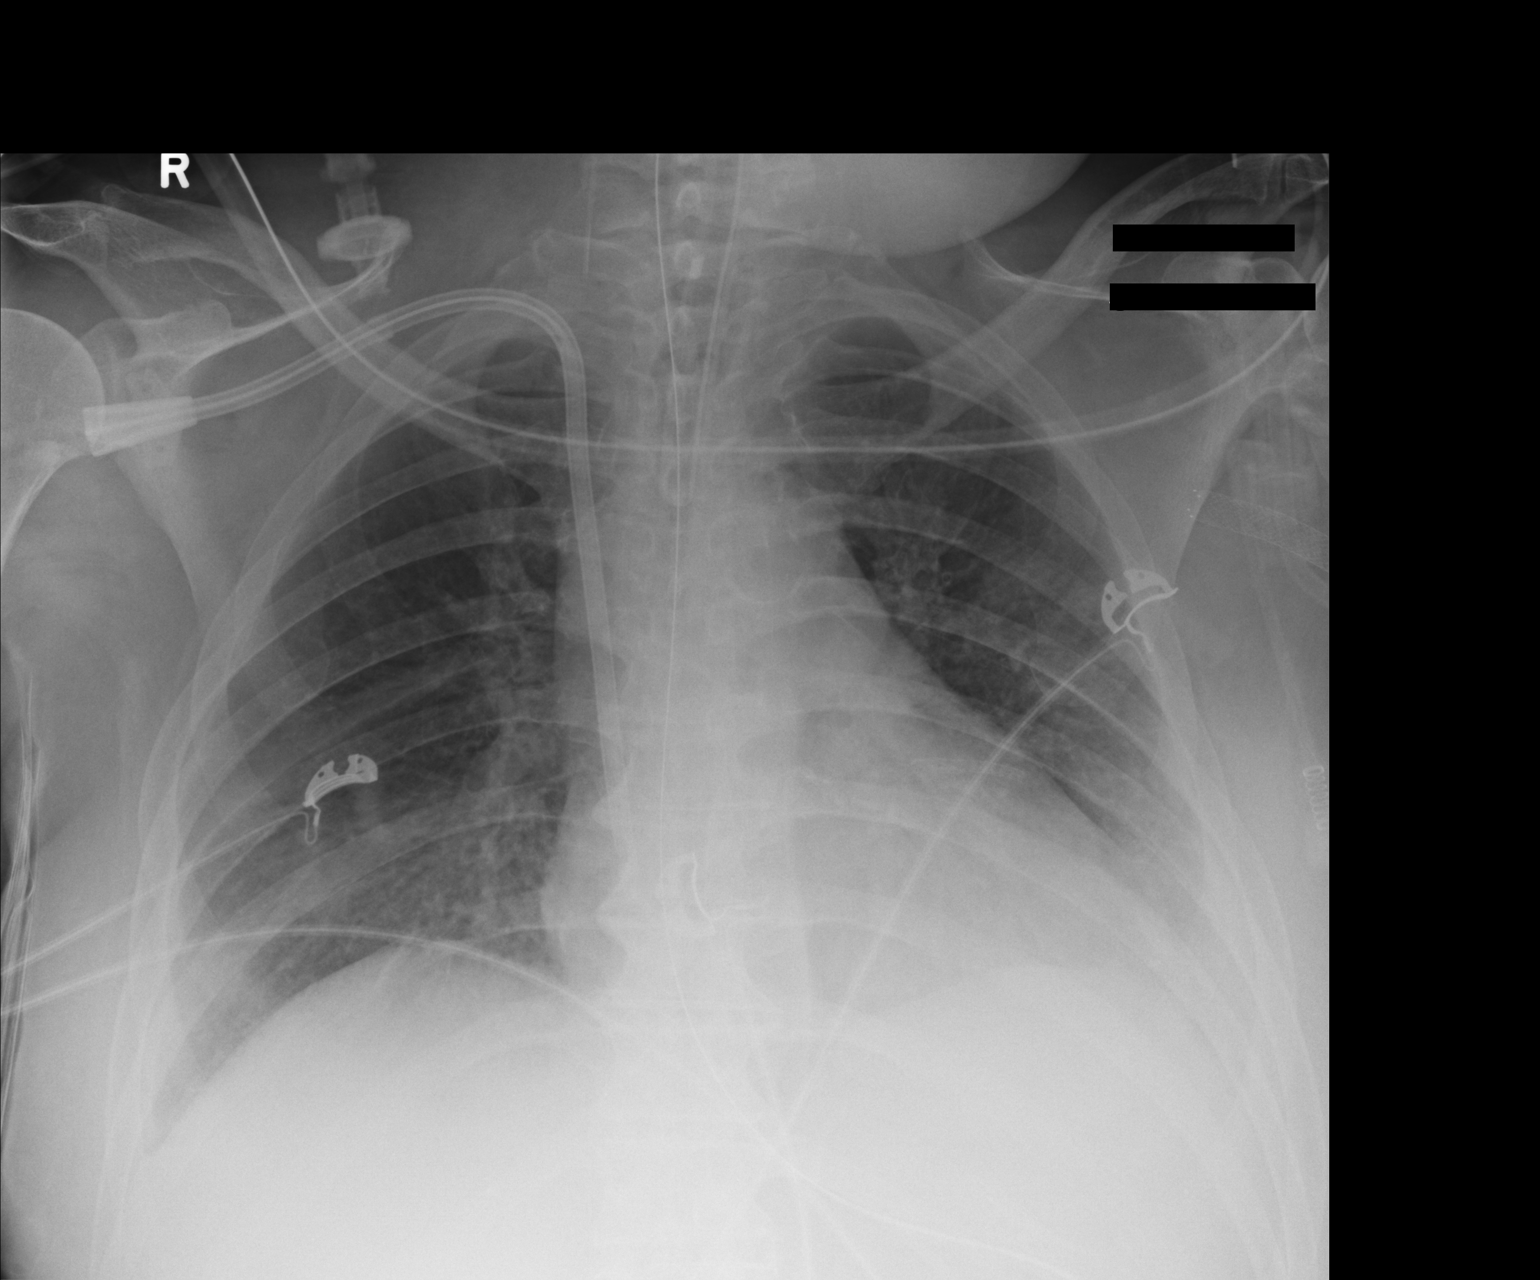

[1 of 1 positions shown; findings below may reference images not displayed]

FINDINGS: The endotracheal tube terminates 3.6 cm above the carina.
An NG tube is in place.  The right IJ dialysis catheter is stable.
The heart size is exaggerated by low lung volumes.  Mild pulmonary
vascular congestion is stable.
IMPRESSION: 1.  The support apparatus is stable.
2.  Mild pulmonary vascular congestion.

## 2013-06-19 IMAGING — CR DG CHEST 1V PORT
1 series · 1 of 1 positions shown · non-contrast
Comparison: 06/20/2012

CLINICAL DATA: Check endotracheal tube.

PORTABLE CHEST - 1 VIEW

[AP]
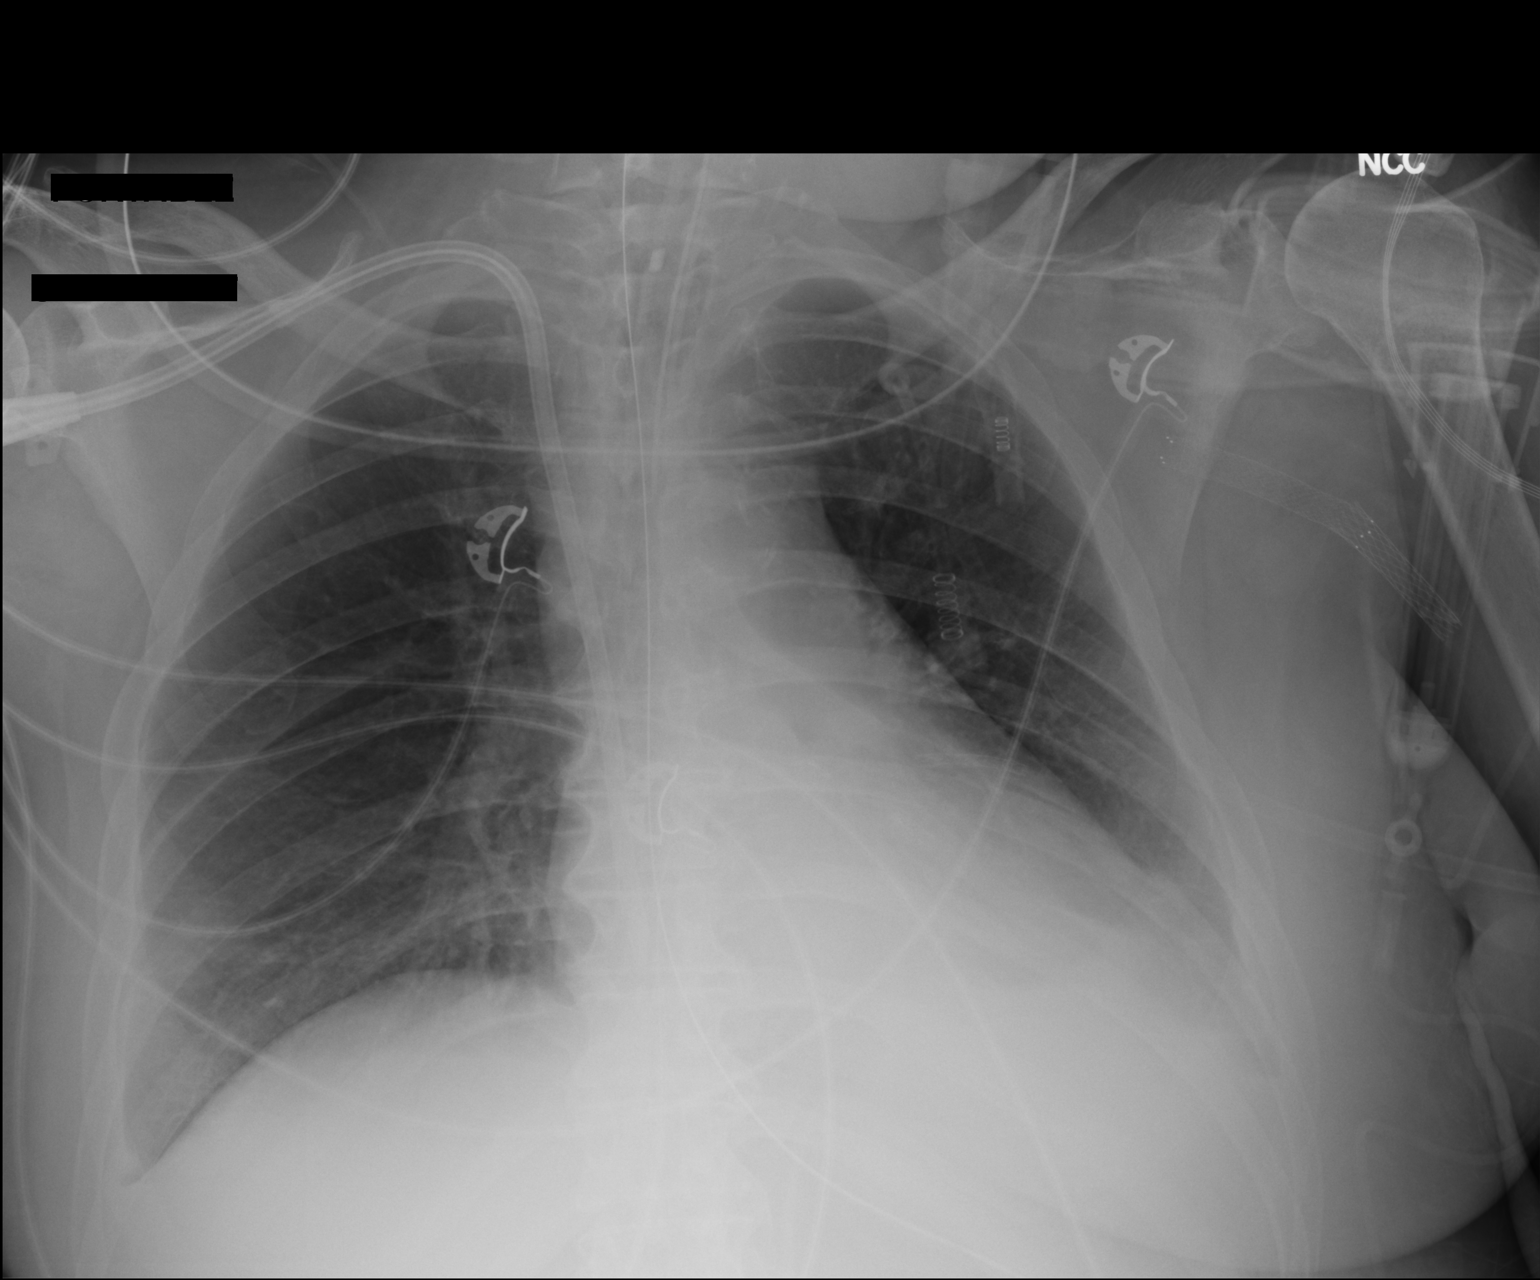

[1 of 1 positions shown; findings below may reference images not displayed]

FINDINGS: Endotracheal tube tip measures about 4.2 cm above the
carina.  Enteric tube tip is below the left hemidiaphragm but not
visible on the image.  Right central venous catheter with tip over
the right atrium.  Borderline heart size with normal pulmonary
vascularity.  Probable small pleural effusions bilaterally.
Infiltration or atelectasis in the left lung base.  No
pneumothorax.
IMPRESSION: Appliances appear unchanged in position.  Small bilateral pleural
effusions.  Infiltration or atelectasis in the left lung base.

## 2013-06-20 IMAGING — CR DG CHEST 1V PORT
1 series · 1 of 1 positions shown · non-contrast
Comparison: the previous day's study

CLINICAL DATA: Respiratory failure

PORTABLE CHEST - 1 VIEW

[AP]
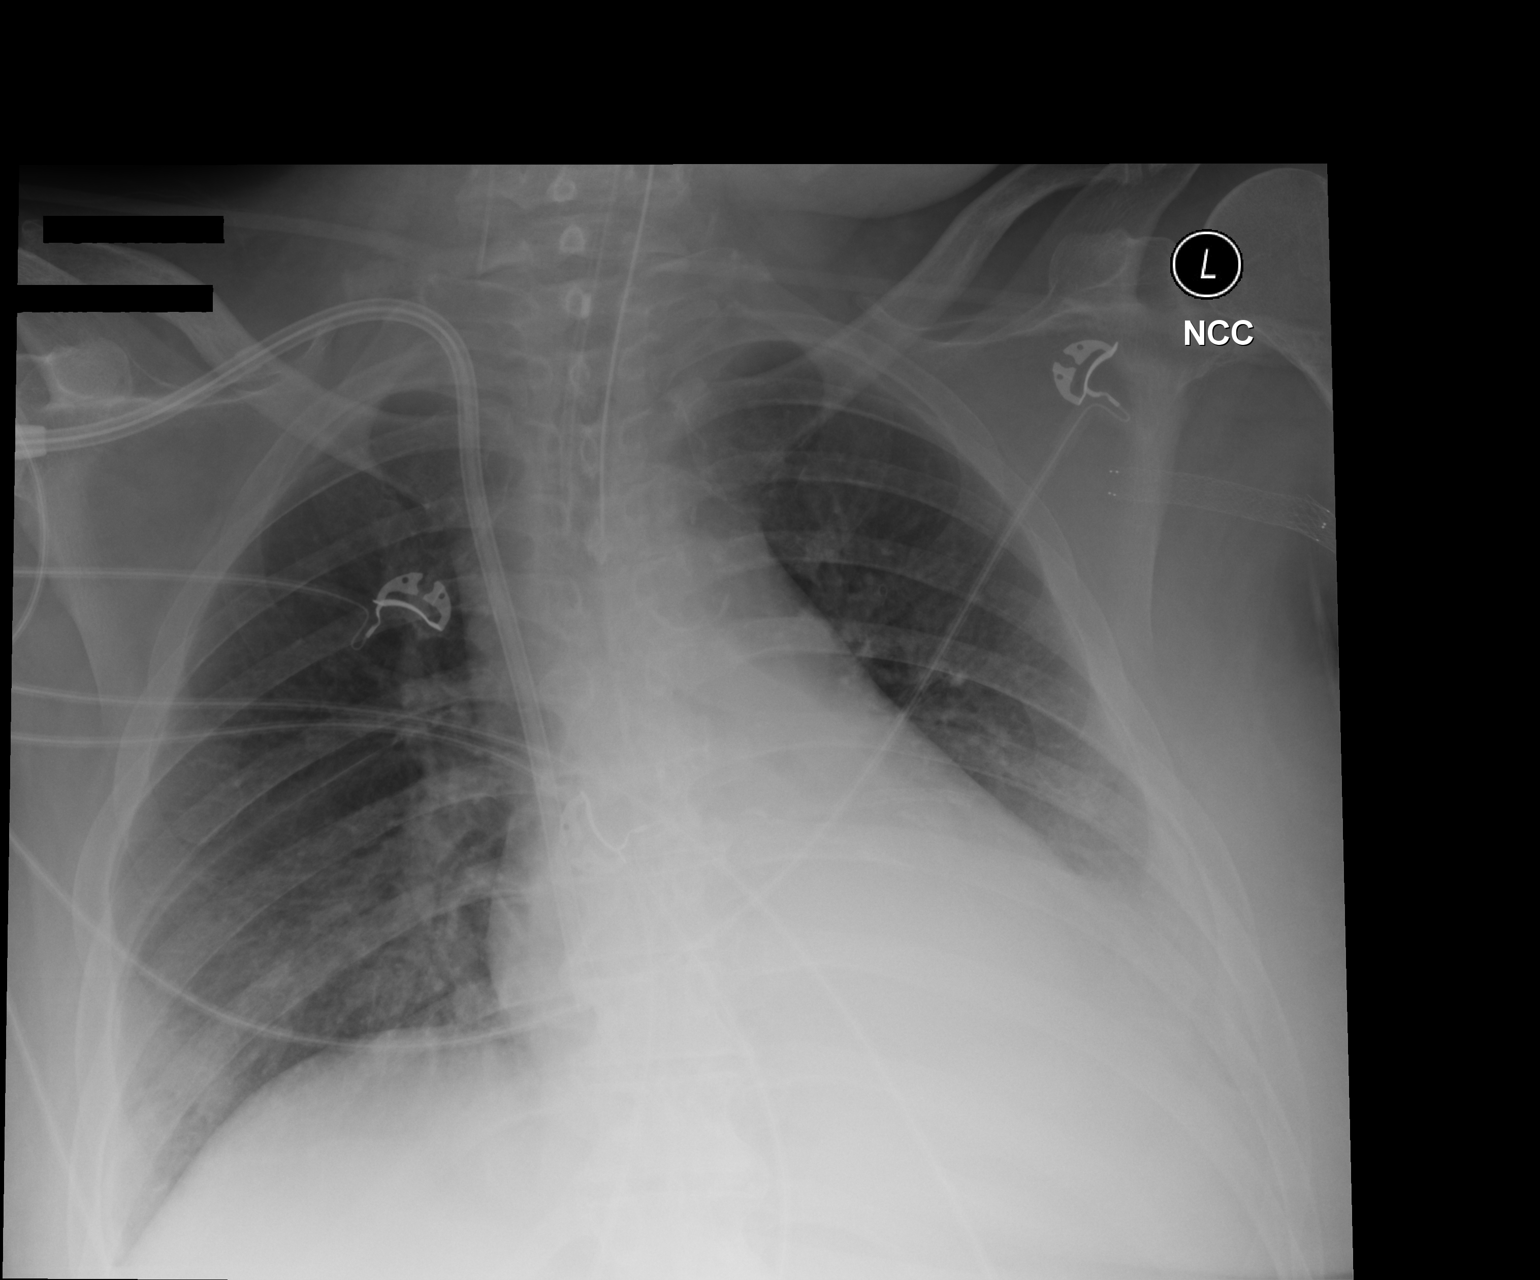

[1 of 1 positions shown; findings below may reference images not displayed]

FINDINGS: The nasogastric tube has been removed.  Endotracheal tube
and tunneled right IJ hemodialysis catheter are stable in position.
Left retrocardiac consolidation / atelectasis persists.  Probable
small left effusion.  Mild bibasilar interstitial edema or
infiltrates, slightly more conspicuous than on prior exam.
Vascular stent projects in the left axilla.
IMPRESSION: 1.  Slight increase in bibasilar interstitial edema or infiltrates.
2.  Persistent small left effusion and left lower lung
consolidation / atelectasis.

## 2013-06-21 IMAGING — CR DG CHEST 1V PORT
1 series · 1 of 1 positions shown · non-contrast
Comparison: 06/22/2012.

CLINICAL DATA: Follow-up atelectasis.  Shortness of breath.

PORTABLE CHEST - 1 VIEW

[AP]
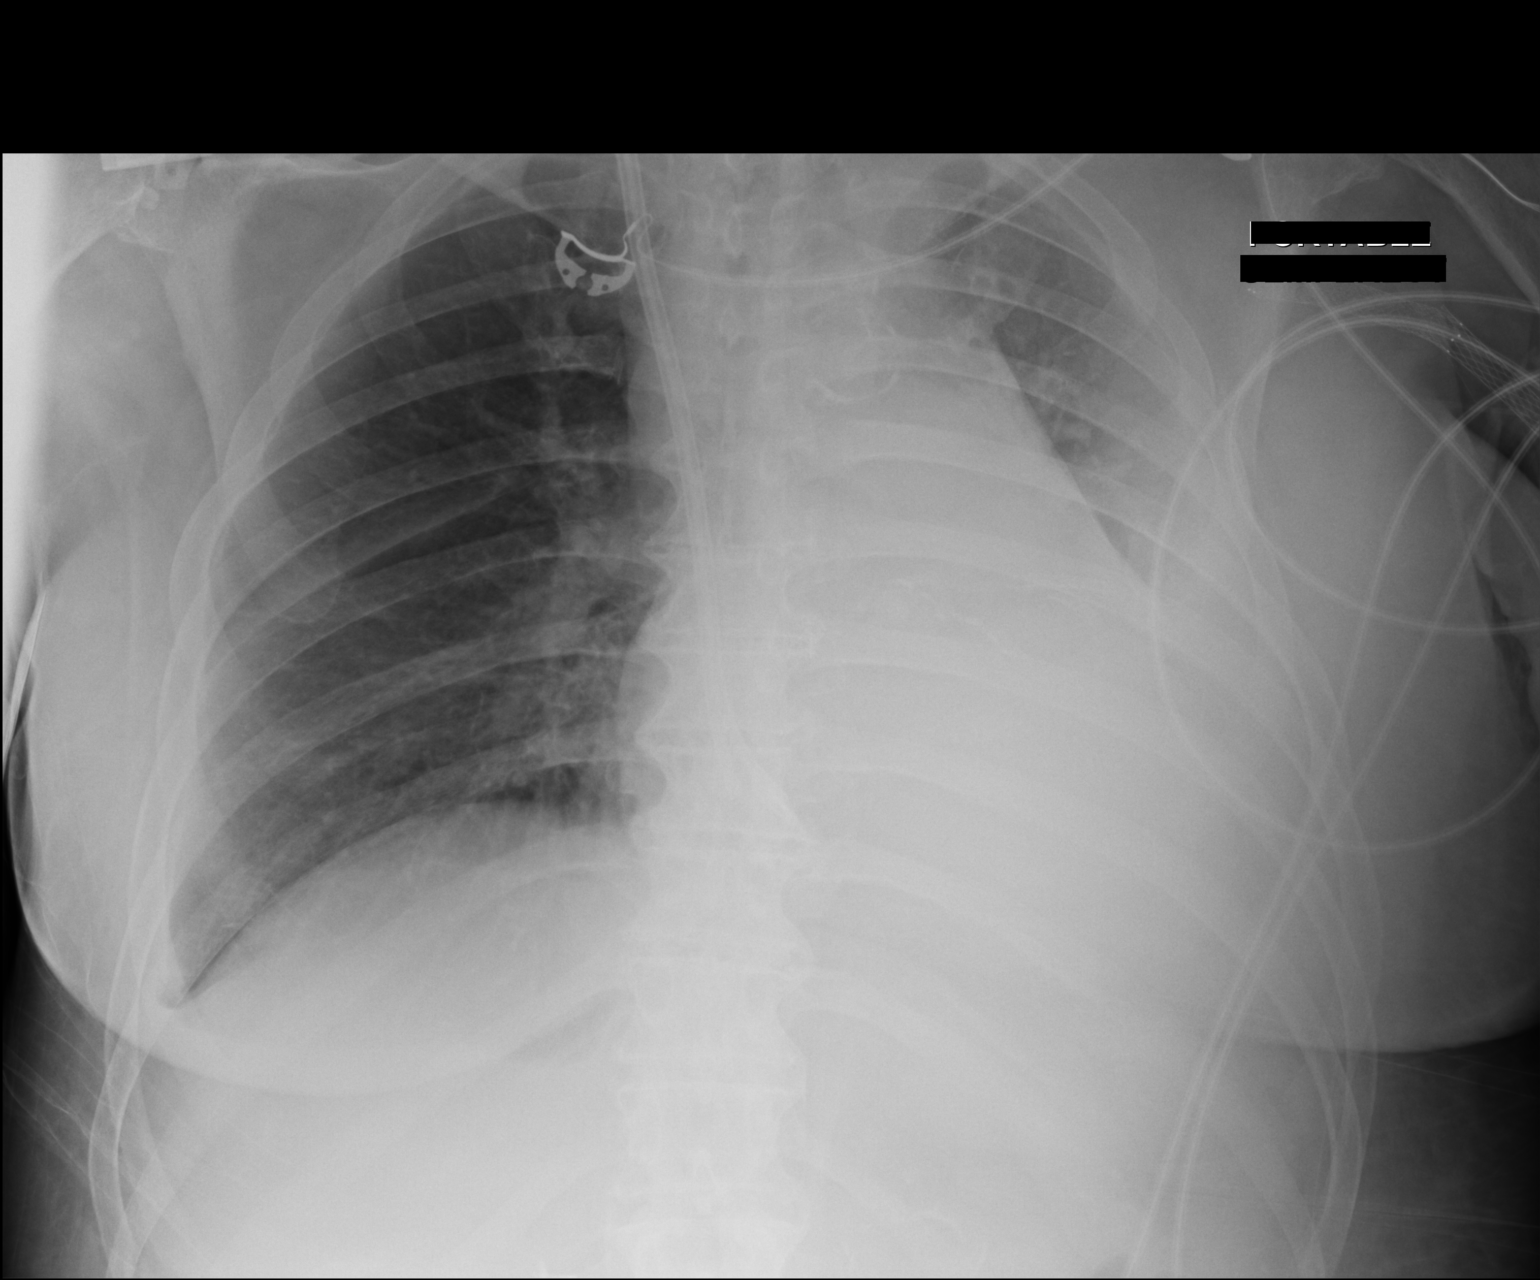

[1 of 1 positions shown; findings below may reference images not displayed]

FINDINGS: Interval extubation.  Right IJ dialysis catheter tips
project over the right atrium.  Heart size stable.  Worsening left
upper and left lower lobe air space consolidation with an enlarging
left pleural effusion.  Small right pleural effusion.
IMPRESSION: 1.  Worsening left upper and left lower lobe air space
consolidation and enlarging left pleural effusion.
2.  Small right pleural effusion.

## 2013-07-16 IMAGING — CR DG CHEST 2V
2 series · 2 of 2 positions shown · non-contrast
Comparison: 06/23/2012

CLINICAL DATA: Cough, preop infected left arm graft

CHEST - 2 VIEW

[view not recorded (1 of 2)]
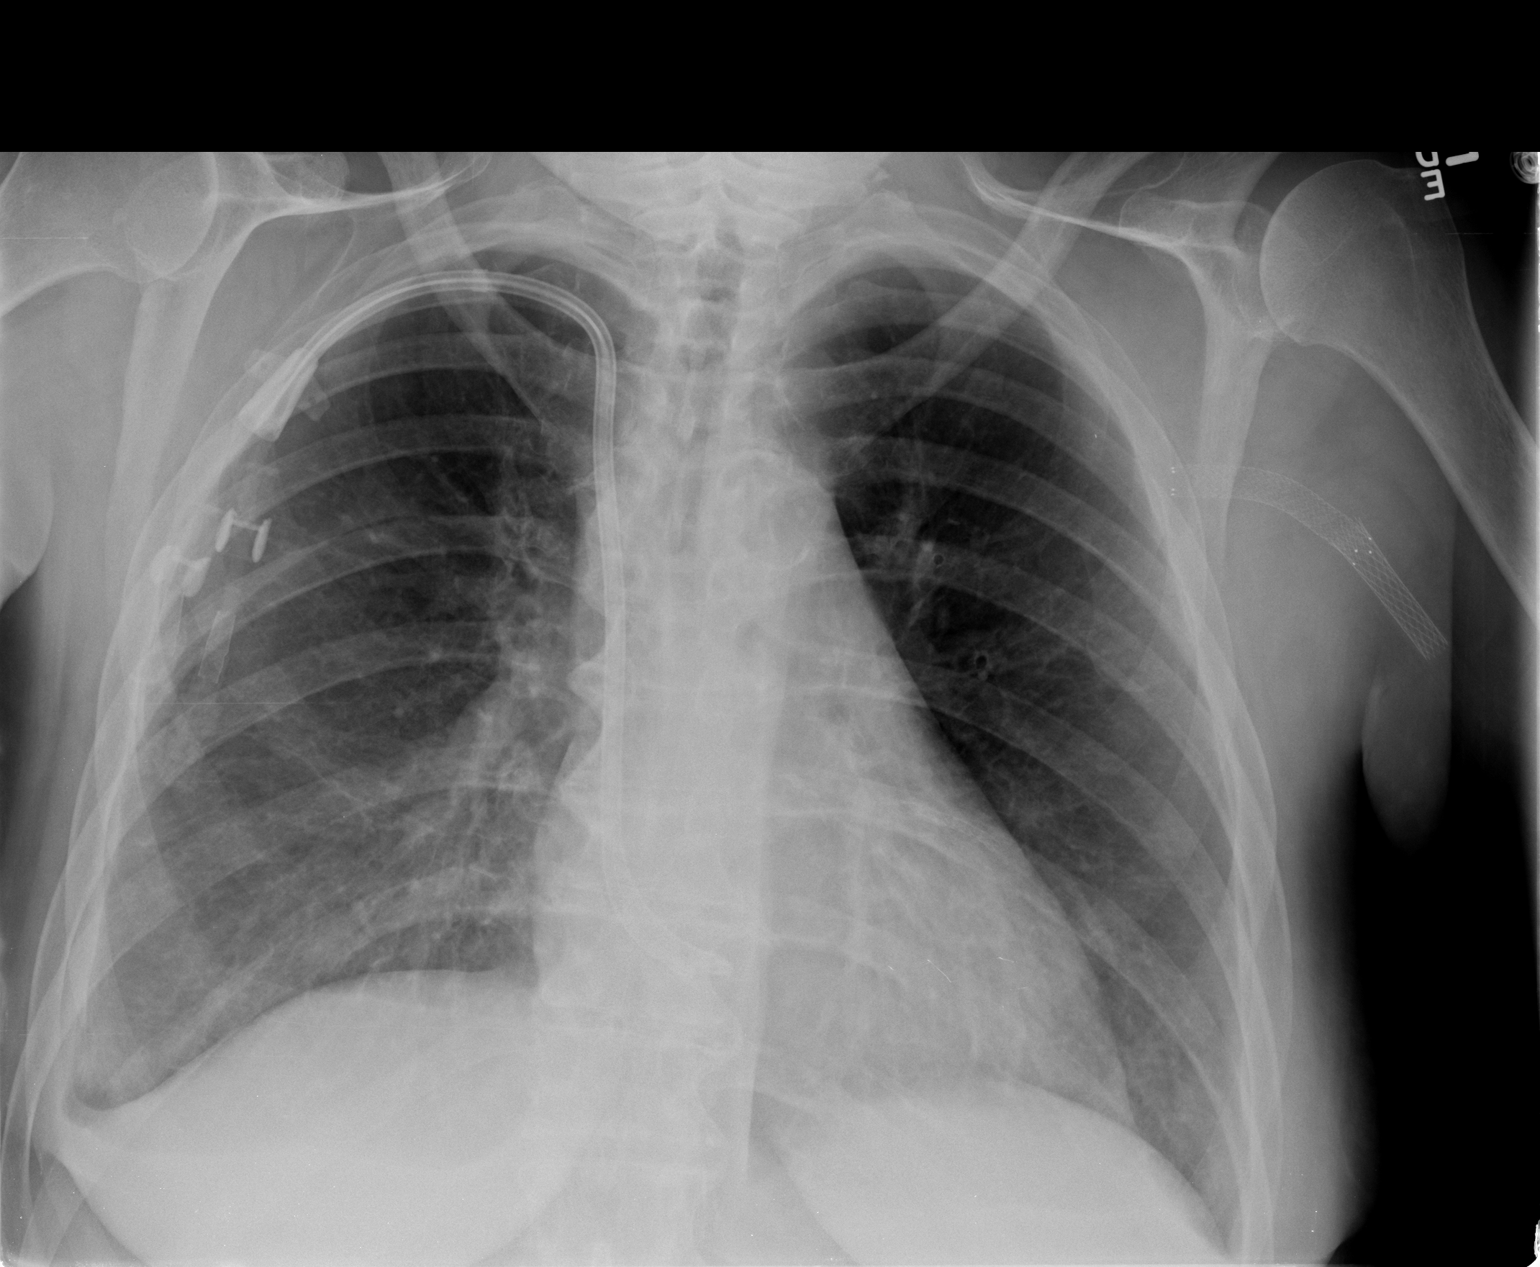

[view not recorded (2 of 2)]
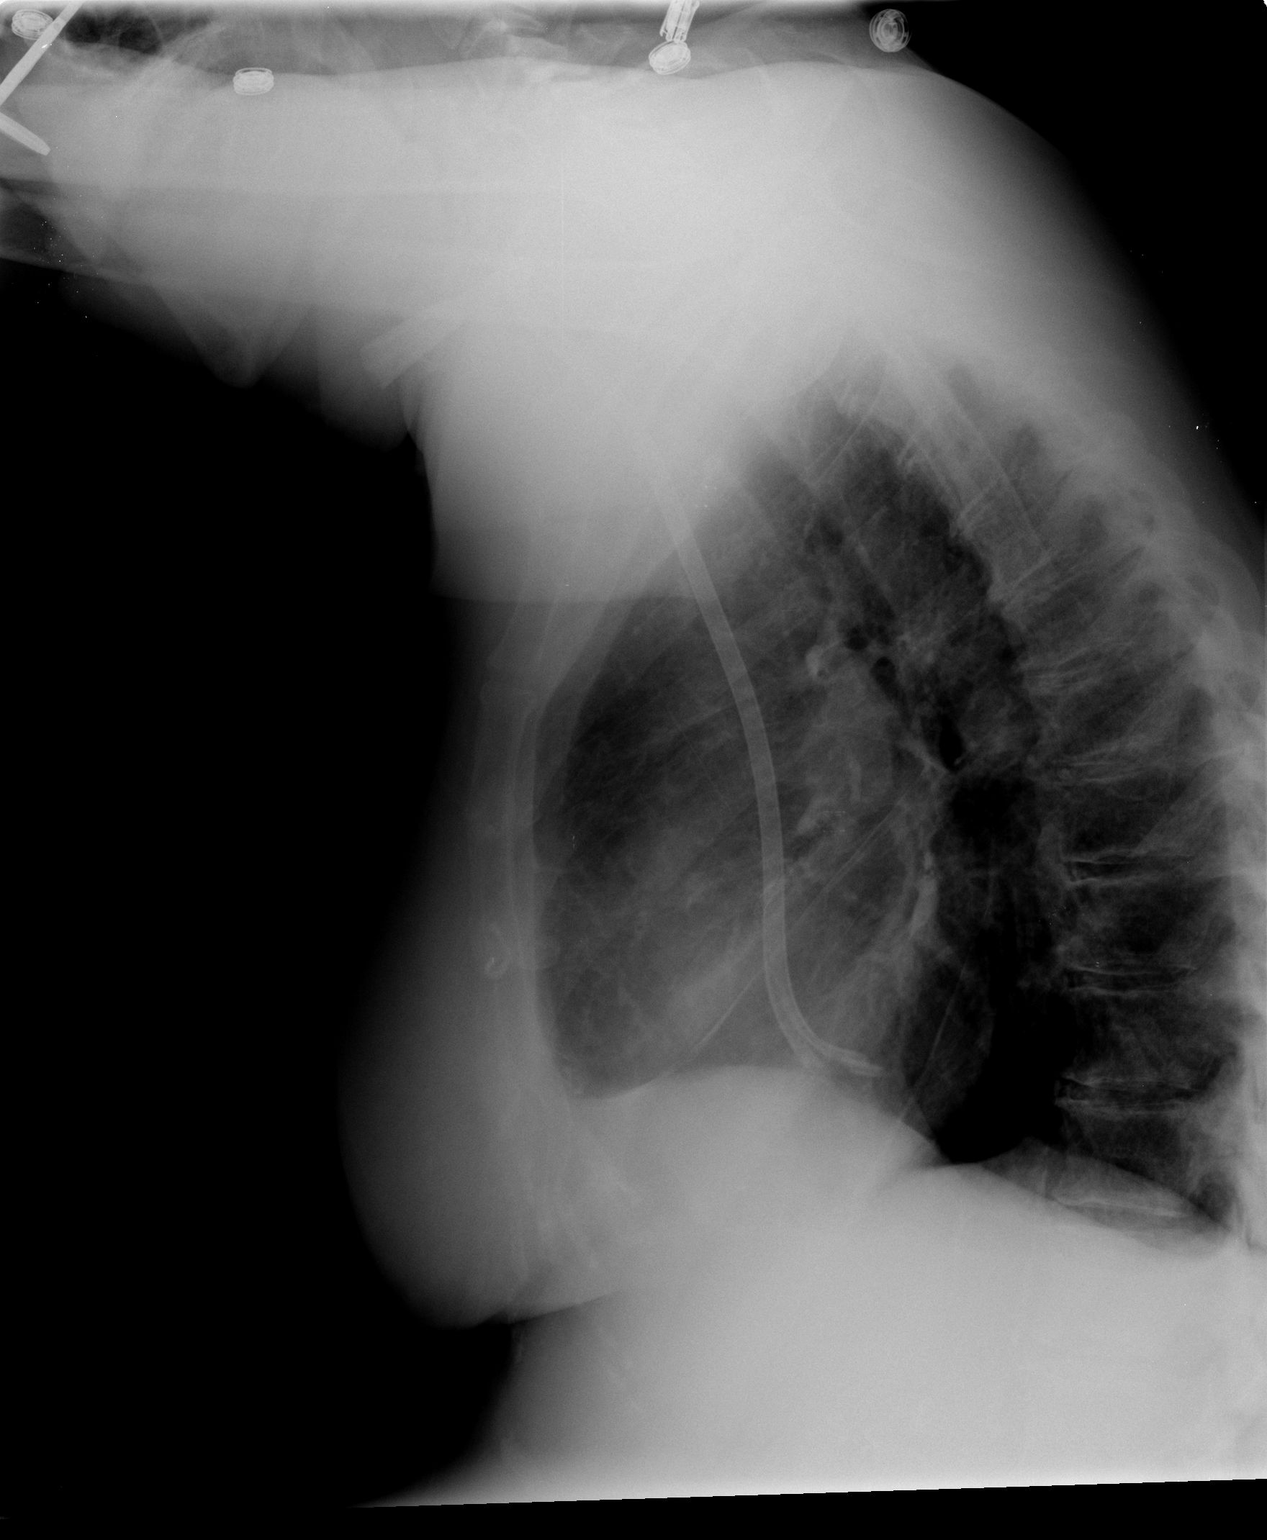

[2 of 2 positions shown; findings below may reference images not displayed]

FINDINGS: Chronic interstitial markings.  No focal consolidation.
No pleural effusion or pneumothorax.

Cardiomediastinal silhouette is within normal limits.

Left axillary stent.  Right IJ dialysis catheter with its tip in
the low right atrium.
IMPRESSION: No evidence of acute cardiopulmonary disease.

## 2013-08-10 IMAGING — CR DG ABDOMEN ACUTE W/ 1V CHEST
3 series · 3 of 3 positions shown · non-contrast
Comparison: Chest radiographs dated 07/18/2012

CLINICAL DATA: Right lower quadrant pain, nausea/vomiting

ACUTE ABDOMEN SERIES (ABDOMEN 2 VIEW & CHEST 1 VIEW)

[view not recorded (1 of 3)]
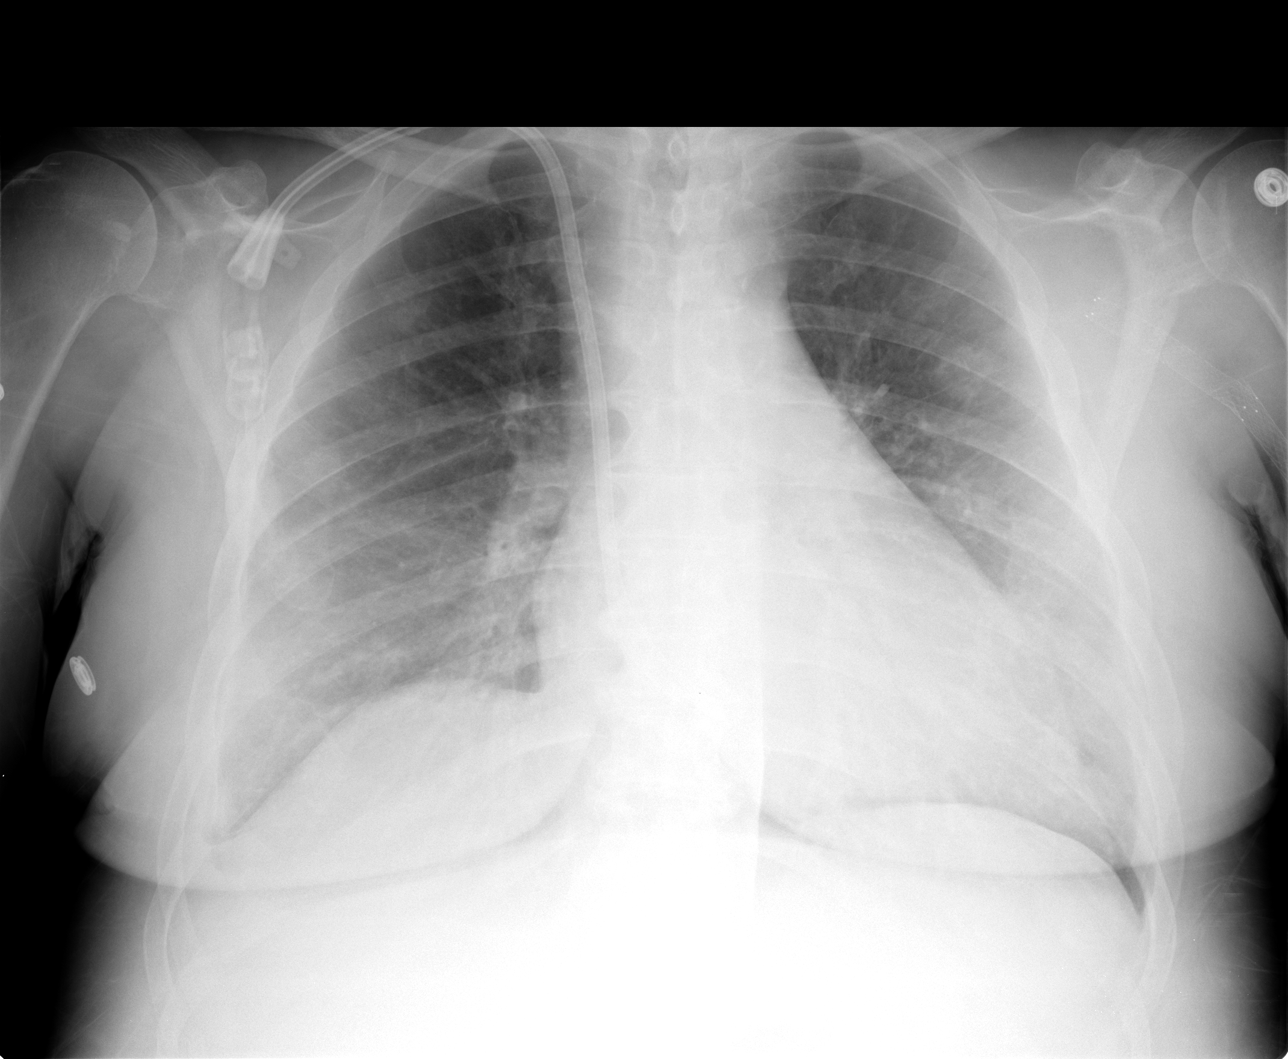

[view not recorded (2 of 3)]
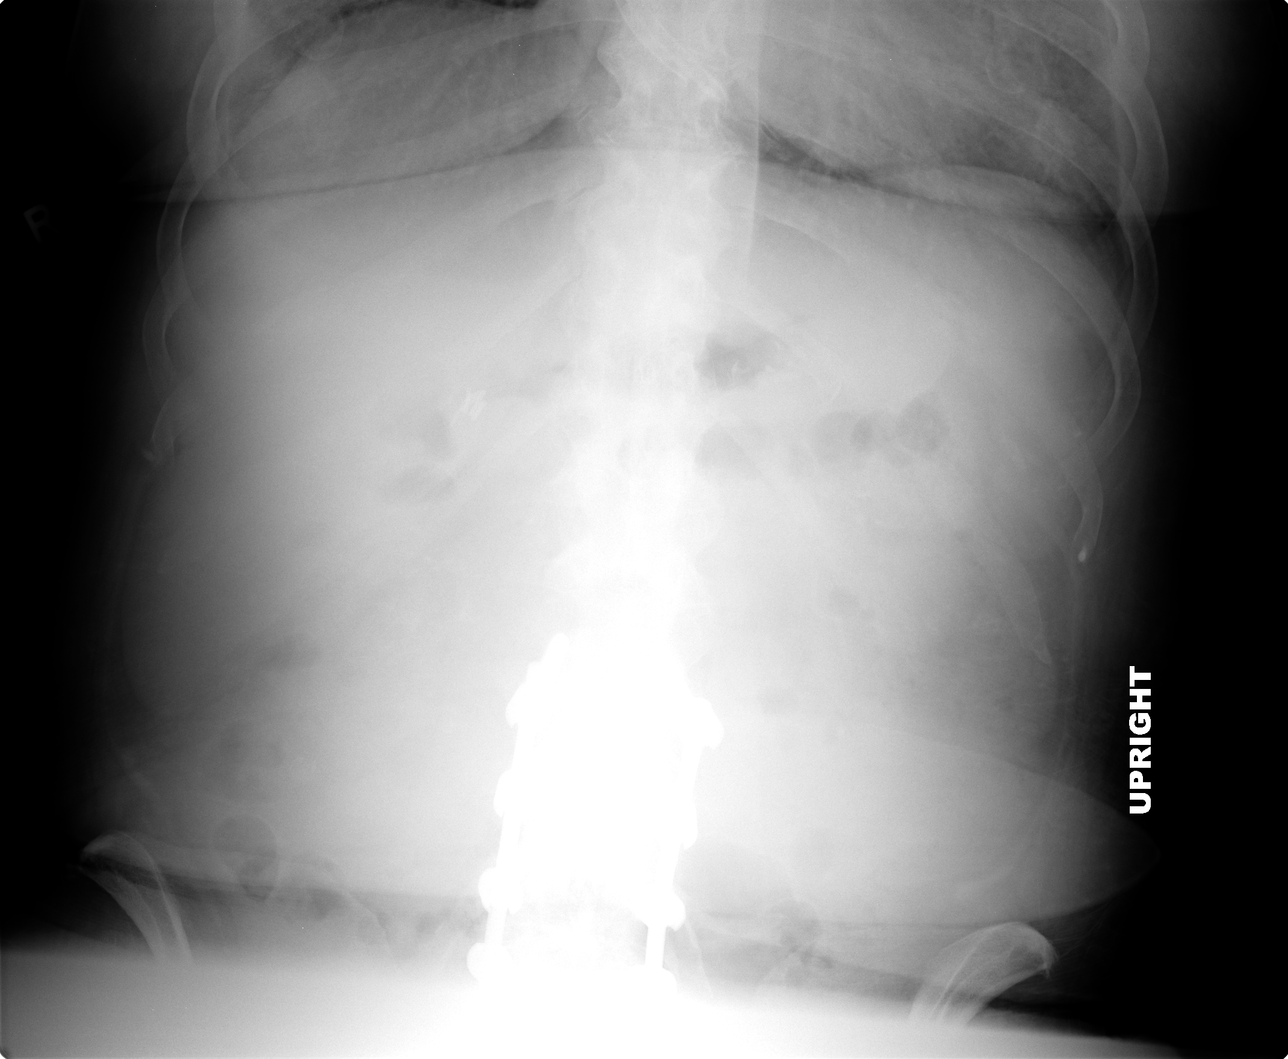

[view not recorded (3 of 3)]
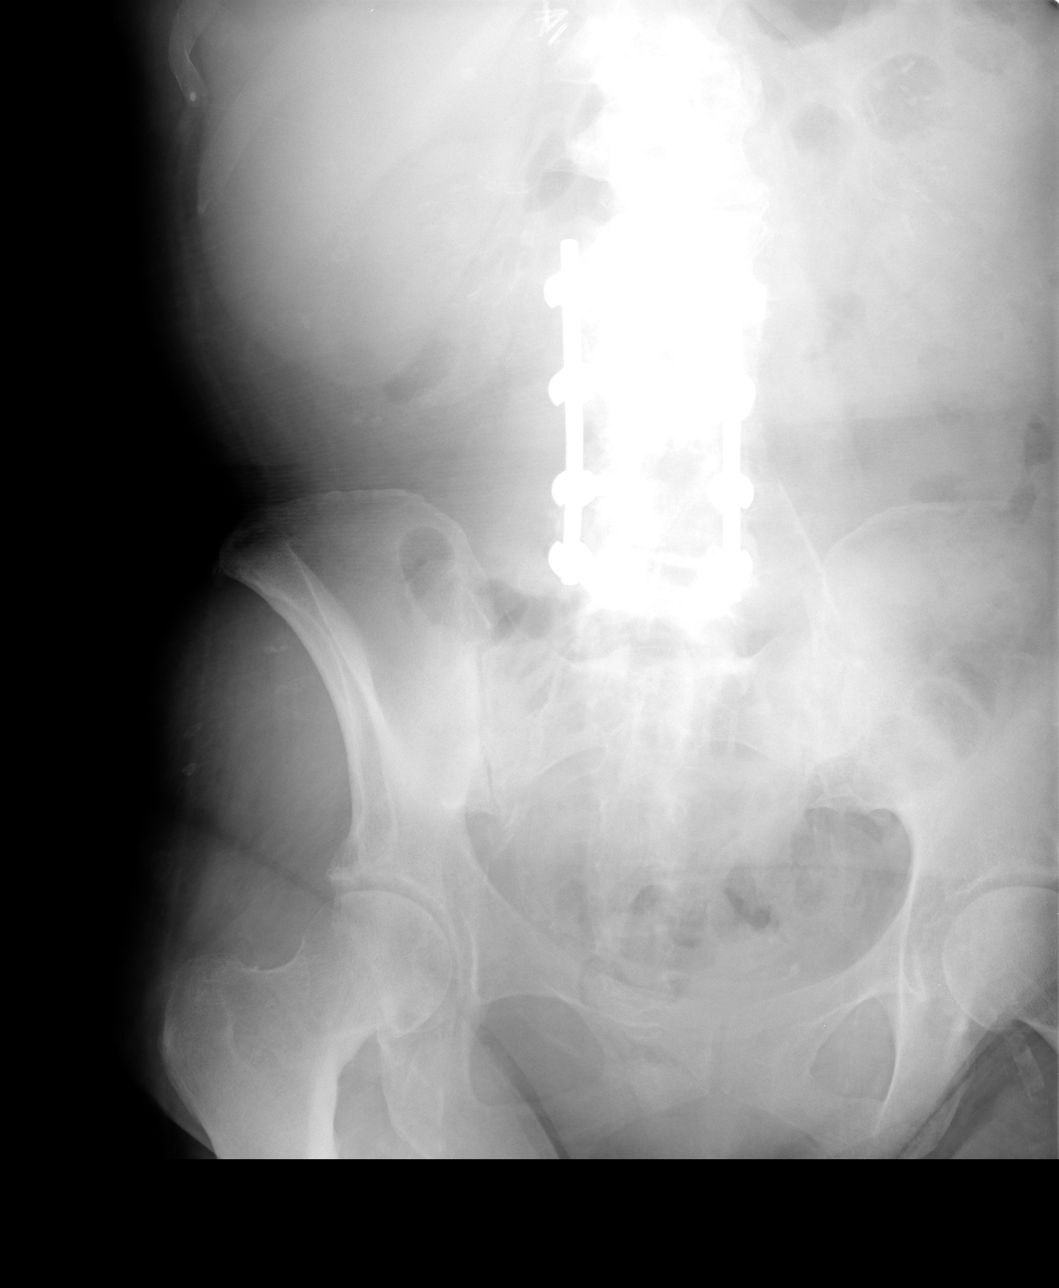

[3 of 3 positions shown; findings below may reference images not displayed]

FINDINGS: Possible 2.2 cm left upper lobe lung nodule, although
this overlies the left anterior 3rd rib, and may be osseous in
etiology.

Similar (but less conspicuous) appearance involving multiple right
anterior ribs.

Prior interstitial markings without frank interstitial edema. No
pleural effusion or pneumothorax.

Mild cardiomegaly.

Stable right IJ dual lumen dialysis catheter.

Nonspecific bowel gas pattern without disproportionate small bowel
obstruction to suggest small bowel obstruction.

No evidence of free air under the diaphragm on the upright view.

Cholecystectomy clips.

Lumbar spine fixation hardware.
IMPRESSION: Possible 2.2 cm left upper lobe lung nodule, although this may be
osseous in etiology.  Nonemergent CT chest is suggested for further
characterization.

No evidence of small bowel obstruction or free air.

## 2013-08-28 IMAGING — CR DG CHEST 1V PORT
1 series · 1 of 1 positions shown · non-contrast
Comparison: 08/12/2012

CLINICAL DATA: Shortness of breath

PORTABLE CHEST - 1 VIEW

[view not recorded]
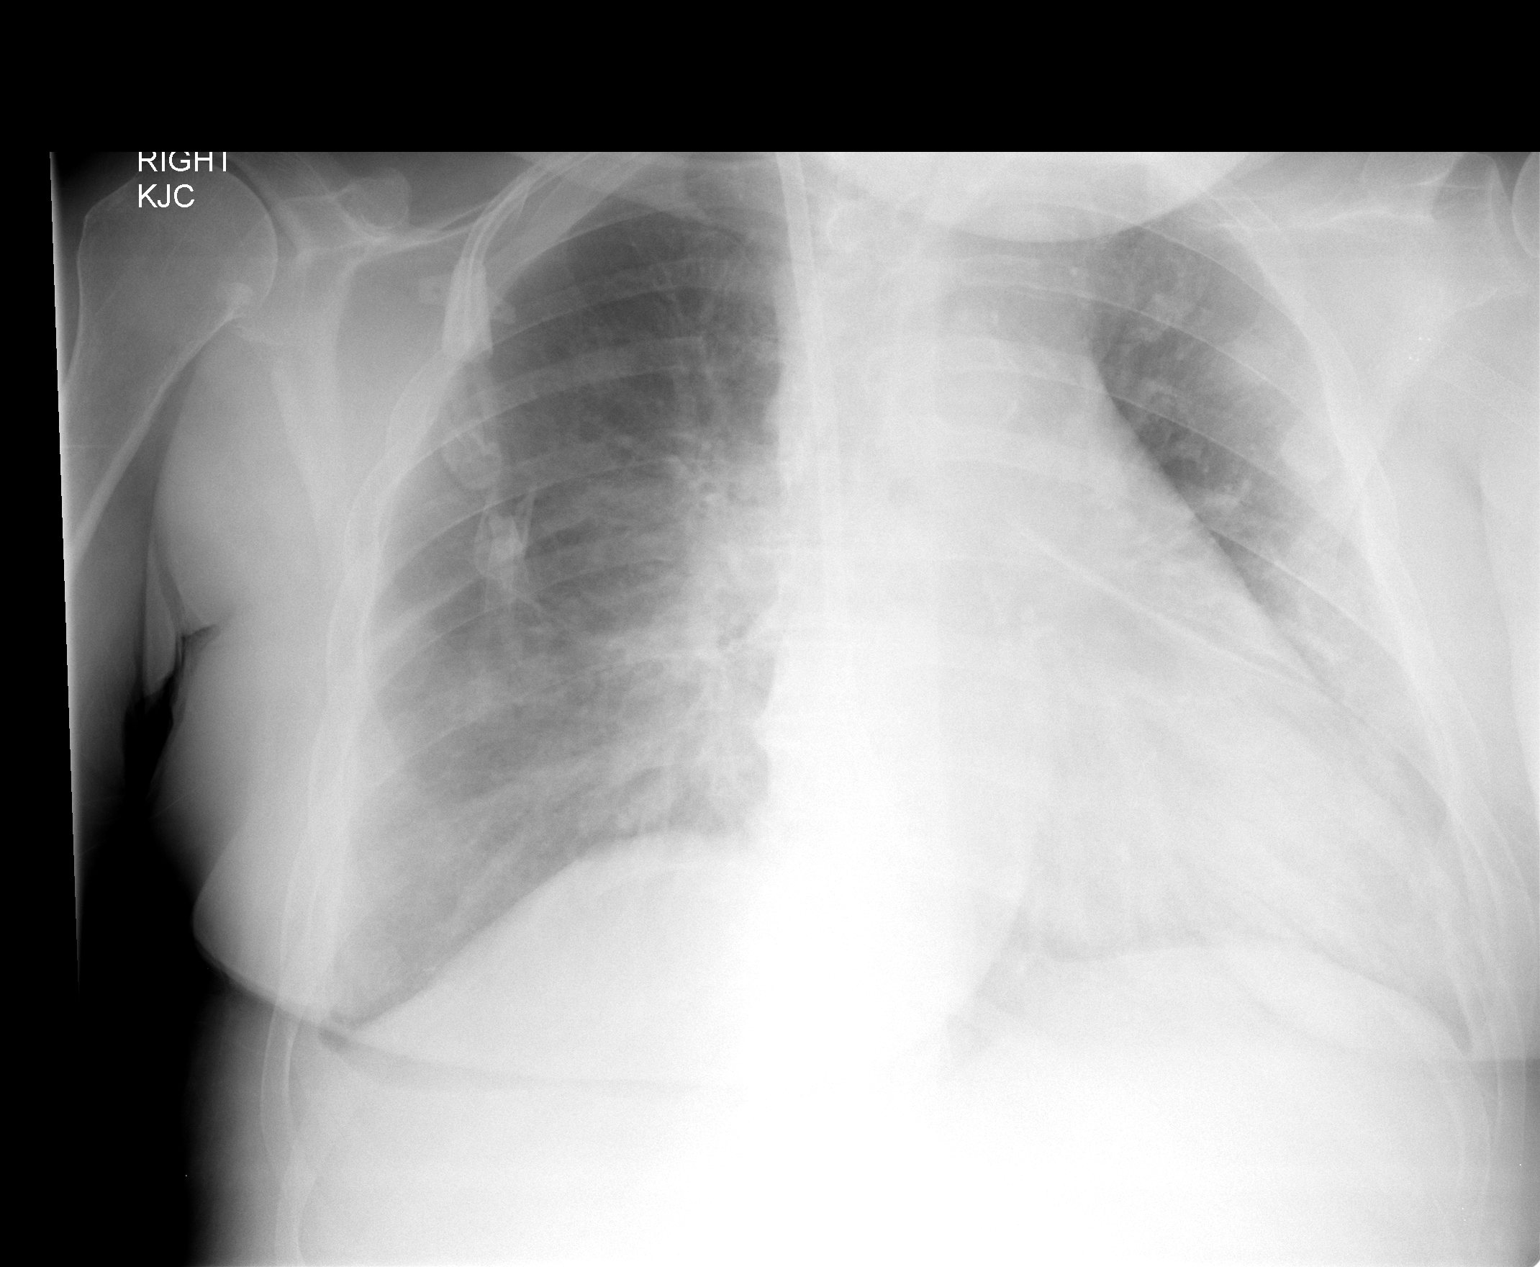

[1 of 1 positions shown; findings below may reference images not displayed]

FINDINGS: Dual lumen right hemodialysis catheter terminates over
the right atrium.  The patient is rotated to the left.  Mild
enlargement cardiomediastinal silhouette is noted.  Previously
questioned left upper lobe nodule appears to correspond to the left
anterior 3rd costochondral cartilage.  There is minimal ill-defined
perihilar airspace opacity and trace bilateral pleural effusions.
IMPRESSION: Cardiomegaly with trace effusions and perihilar opacity could
indicate early edema.

## 2013-08-28 IMAGING — CT CT ABD-PELV W/O CM
2 of 3 series · 16 of 46 positions shown, 18 images · non-contrast
Comparison: 05/02/2012

CLINICAL DATA: Left-sided flank pain, patient on dialysis

CT ABDOMEN AND PELVIS WITHOUT CONTRAST
TECHNIQUE: Multidetector CT imaging of the abdomen and pelvis was
performed following the standard protocol without intravenous
contrast.

[Series 2: standard/full over (age)lbs 5.0 · axial · 0.76mm/px · z∈[-538,-92]mm · 13 of 103 slices shown, 15 images]
[im 7/103  soft-tissue]
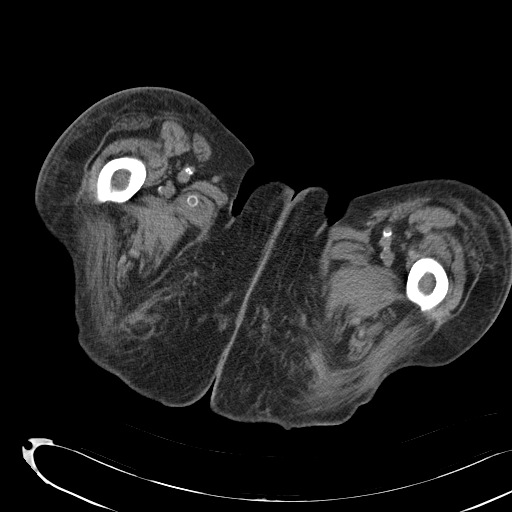
[im 7/103  bone]
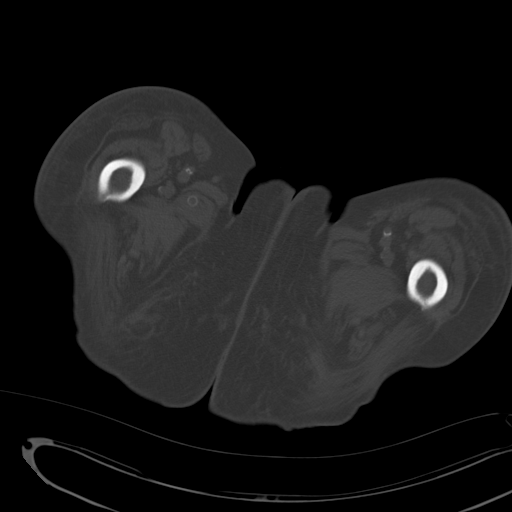
[im 14/103  soft-tissue]
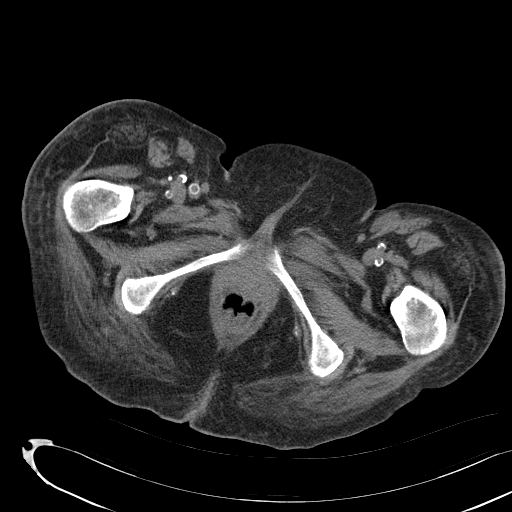
[im 20/103  soft-tissue]
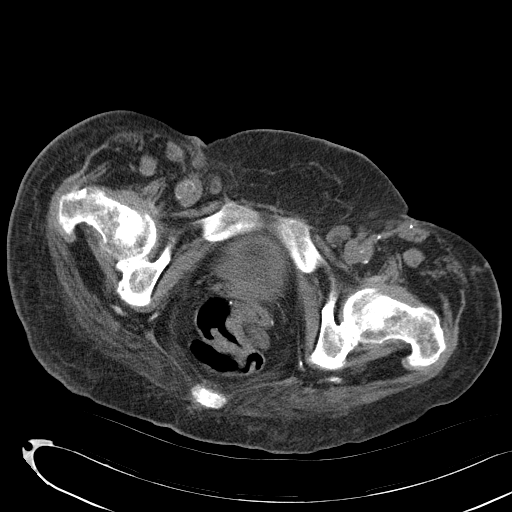
[im 30/103  soft-tissue]
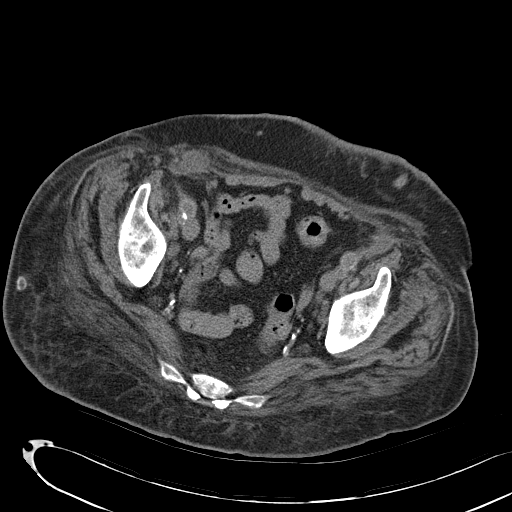
[im 37/103  soft-tissue]
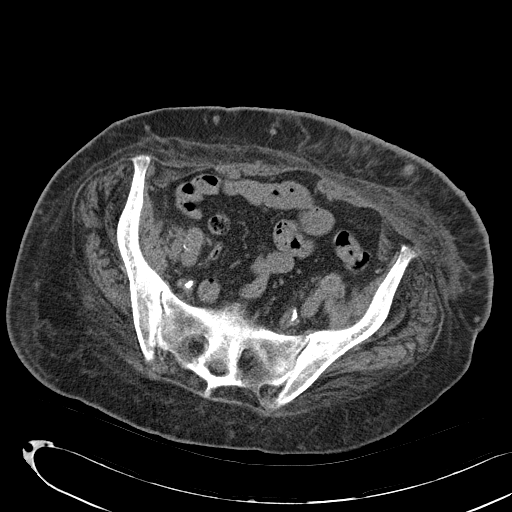
[im 43/103  soft-tissue]
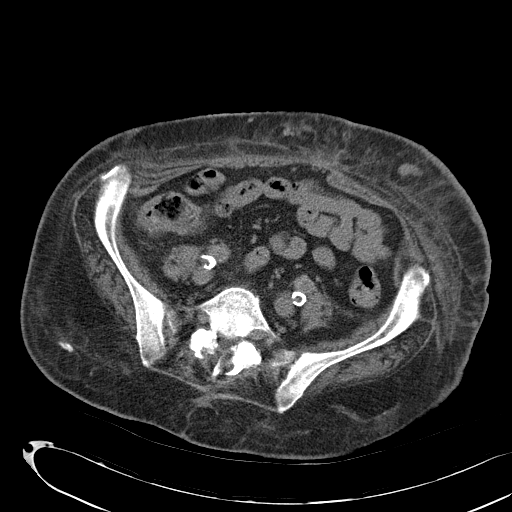
[im 53/103  soft-tissue]
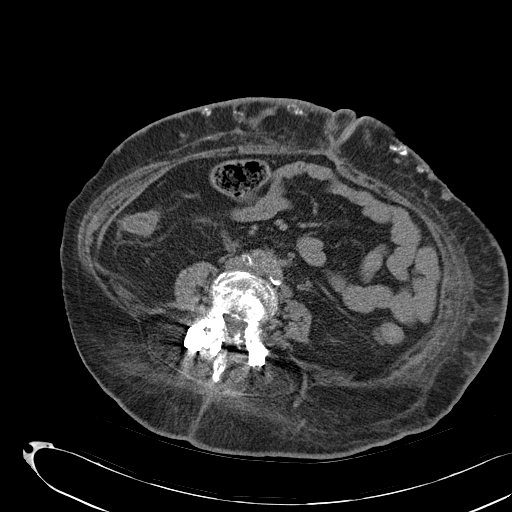
[im 60/103  soft-tissue]
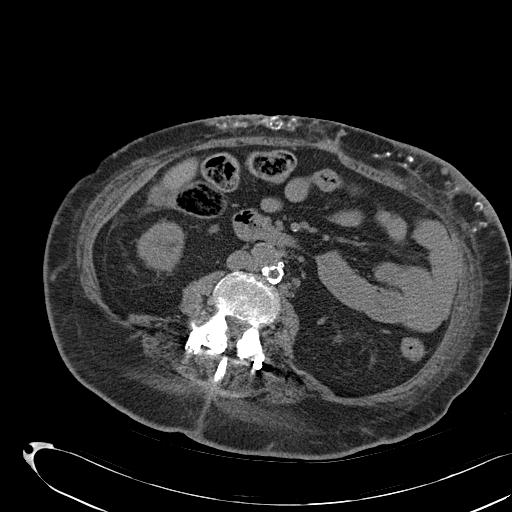
[im 66/103  soft-tissue]
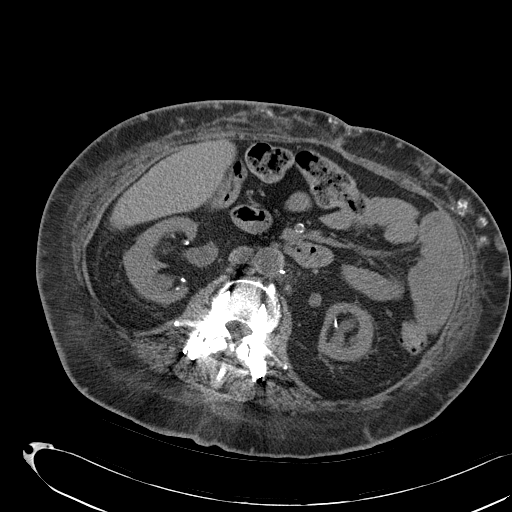
[im 66/103  bone]
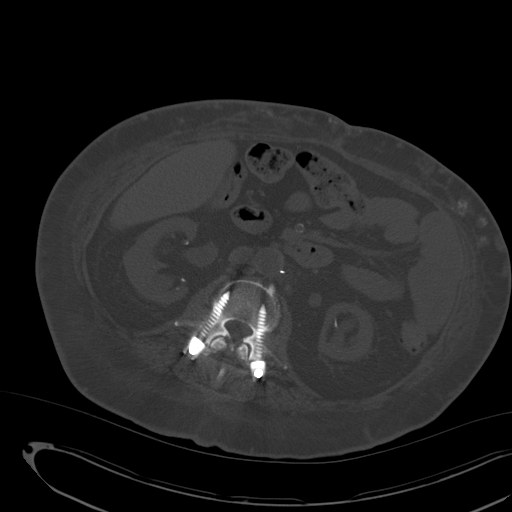
[im 73/103  soft-tissue]
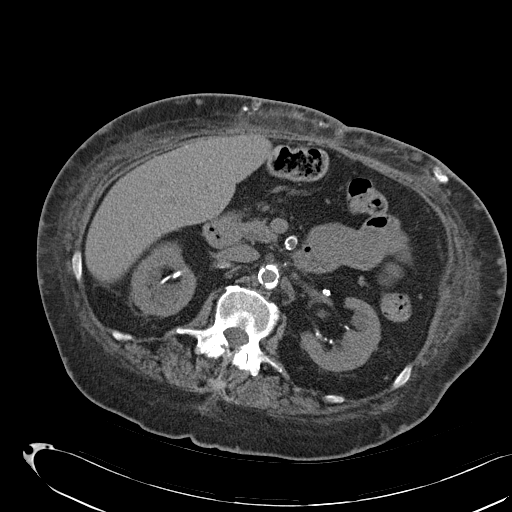
[im 83/103  soft-tissue]
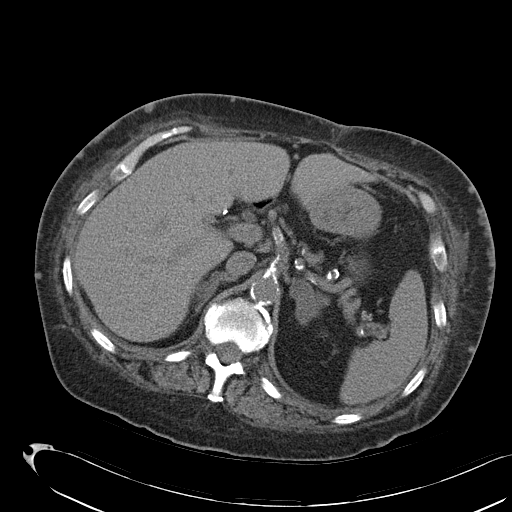
[im 89/103  soft-tissue]
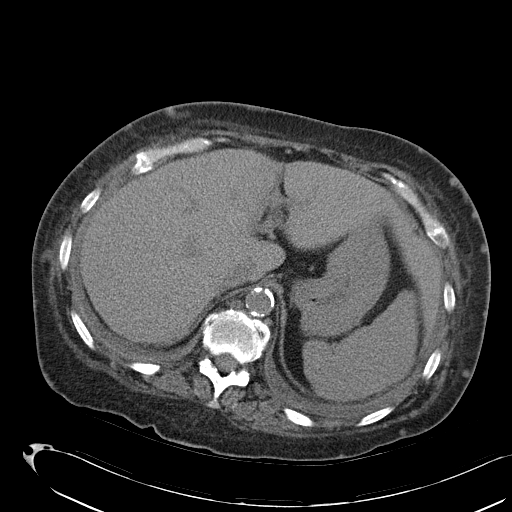
[im 96/103  soft-tissue]
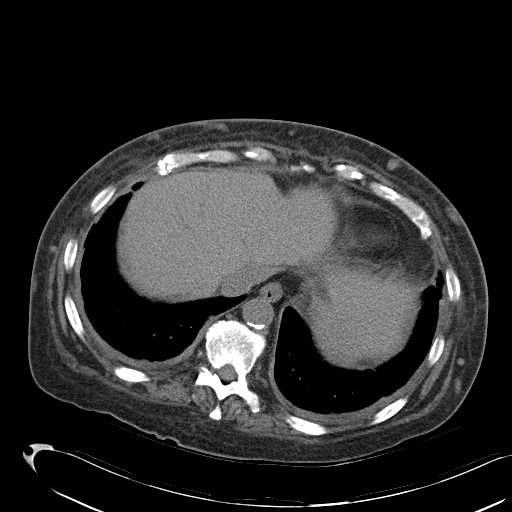

[Series 4: mpr coronal · coronal · 0.90mm/px · 3 of 102 slices shown]
[im 34/102  soft-tissue]
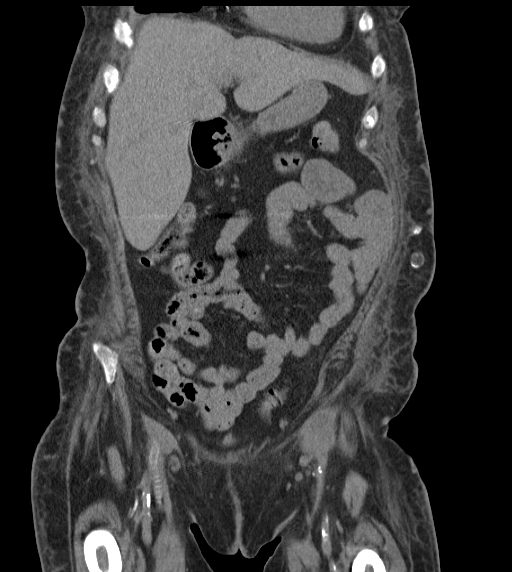
[im 45/102  soft-tissue]
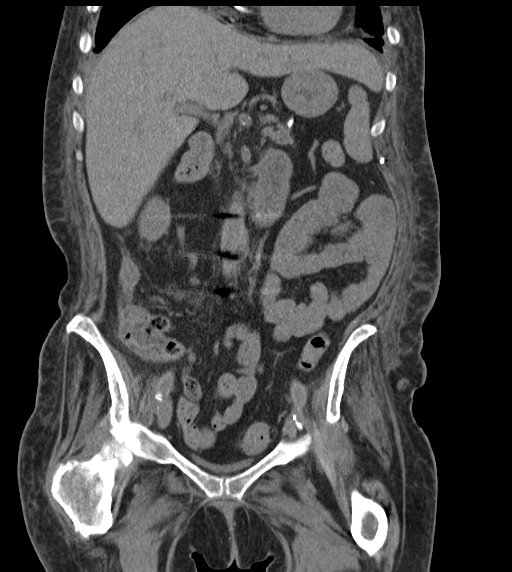
[im 57/102  soft-tissue]
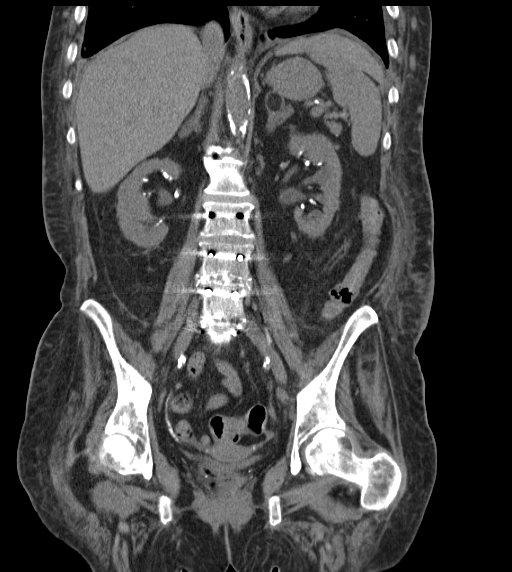

[16 of 46 positions shown; findings below may reference images not displayed]

FINDINGS: Trace pleural effusions and associated minimal
compressive atelectasis again noted.  Mild subcutaneous soft tissue
edema.  Exuberant coarse calcification and stranding within the
anterior abdominal wall subcutaneous tissues may indicate injection
sites.  The hepatic dome incompletely imaged.  Posterior segment
right hepatic lobe granuloma image 24 is imaged, unchanged.
Cholecystectomy clips noted.  Left adrenal myelolipoma again noted.
Bilateral adrenal cortical hypoplasia again noted.

Bilateral renal cortical thinning is stable.  Spleen and pancreas
are unremarkable.  Extensive vascular calcification.  Evidence of
aortobi-iliac graft.  Bilateral groin collections/masses measuring
just above fluid density are new since prior exam.  These measure
3.9 cm maximally on the right and 3.9 cm maximally on the left,
images 79 and 77, respectively.

There is new minimal fullness of the bilateral intrarenal
collecting systems with tapering to normal caliber proximal
ureters.  This is of uncertain etiology assuming the patient is
anuric.

No ascites or lymphadenopathy.  No bowel wall thickening.  Apparent
hysterectomy.  Ovaries are normal.  Trace pelvic free fluid is
identified.  Fem-pop bypass graft partly visualized.  No new
osseous abnormality.  Lumbar fusion hardware noted.
IMPRESSION: Bilateral hypodense inguinal collections/masses as above, measuring
just above the simple fluid.  These could represent seromas related
to prior bypass grafting surgery or less likely lymphadenopathy.
If there has been vascular access to these areas, pseudoaneurysm
could have a similar appearance.  Ultrasound of the bilateral
inguinal regions in the [REDACTED] is recommended for further
evaluation.  This should be done with specific attention also to
vascular structures to evaluate for possible pseudoaneurysm or less
likely AV fistula.

No acute intra-abdominal or pelvic pathology to account for the
history of left-sided pain.

New minimal fullness of the bilateral intrarenal collecting systems
without ureterectasis.  Etiology is uncertain.  Given the presence
of prior retroperitoneal surgery, it is possible there is an occult
element of fibrosis or scarring causing adhesion or secondary
involvement of the ureters, but if the patient is anuric the
significance is unknown.

## 2013-08-29 IMAGING — US US EXTREM LOW BILAT COMP
1 series · 14 of 25 positions shown · non-contrast
Comparison: CT 08/30/2012

CLINICAL DATA: Bilateral inguinal masses on prior exam, history of
fem-pop bypass grafting and left lower extremity swelling with
erythema

BILATERAL LOWER EXTREMITY LIMITED SOFT TISSUE ULTRASOUND
TECHNIQUE: Ultrasound examination of the lower extremity soft
tissues was performed in the area of clinical concern.

[Series 1: us extrem low bilat comp · 0.09mm/px · 14 of 39 slices shown]
[im 1/39]
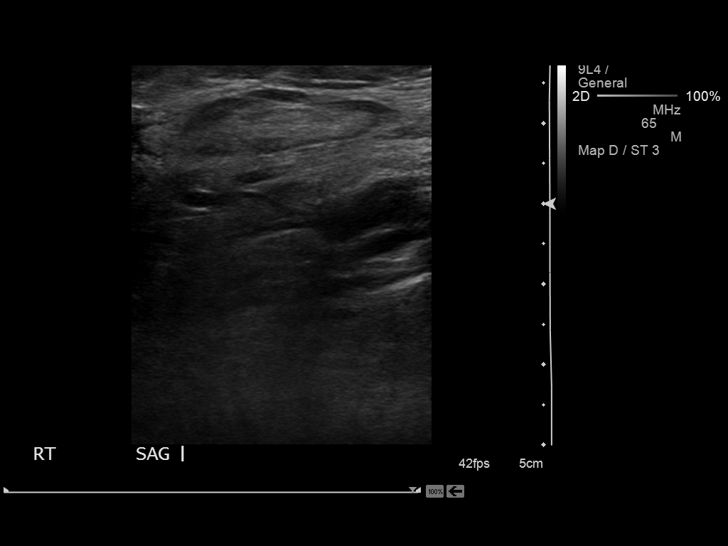
[im 4/39]
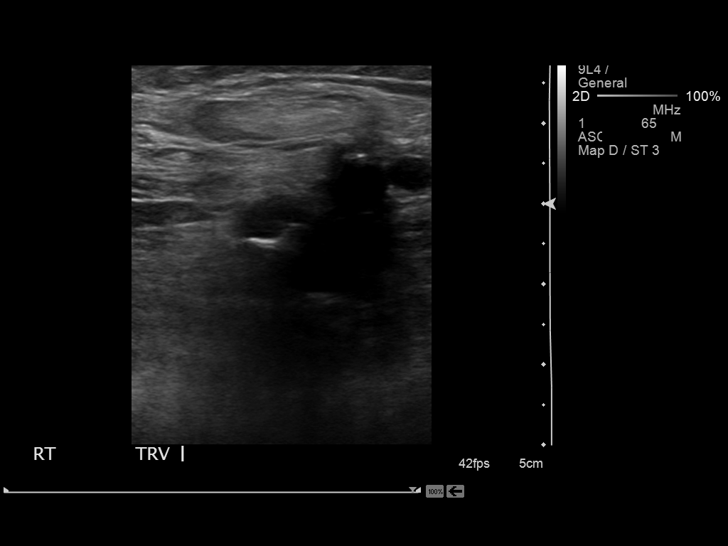
[im 7/39]
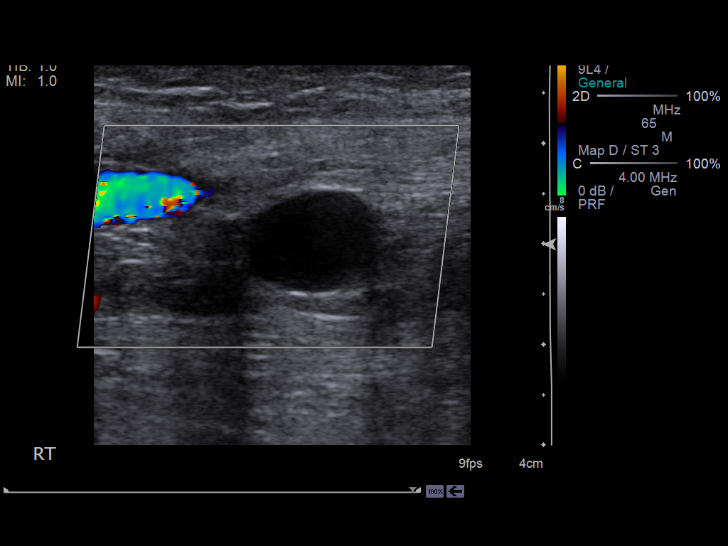
[im 10/39]
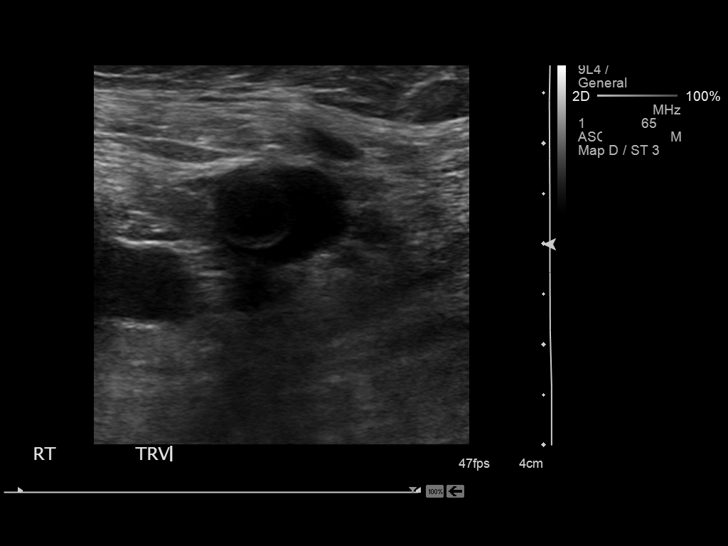
[im 13/39]
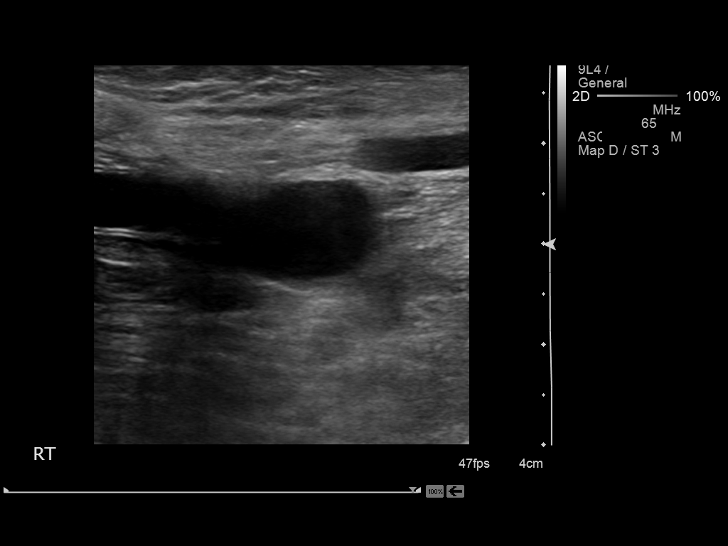
[im 15/39]
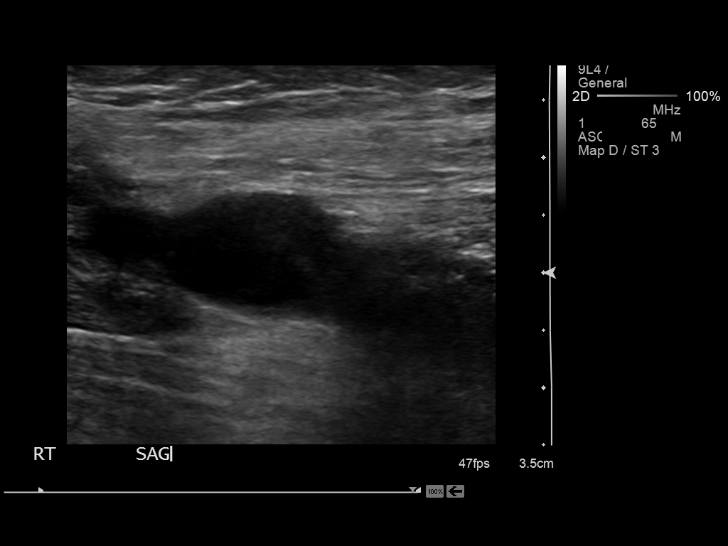
[im 18/39]
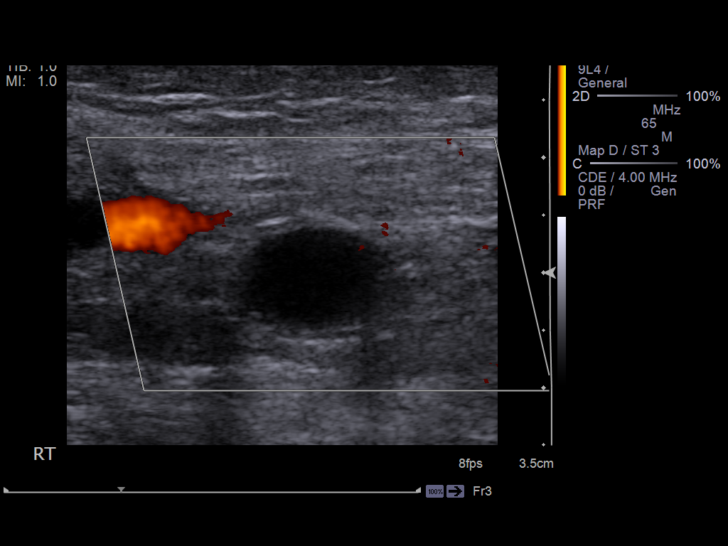
[im 21/39]
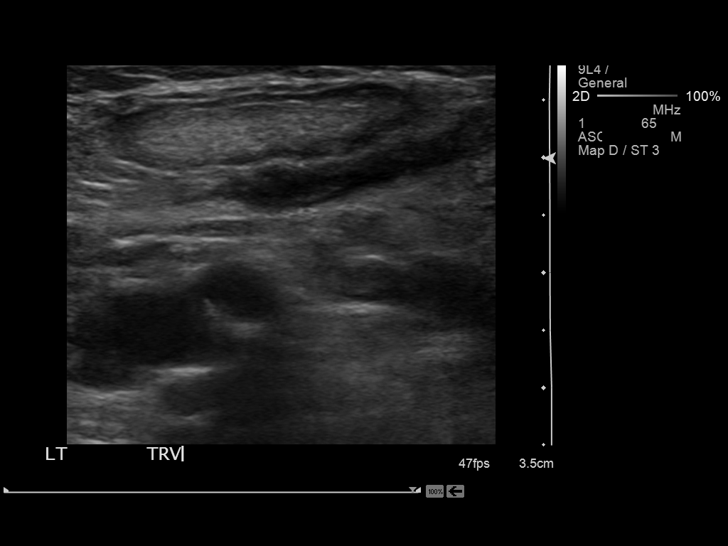
[im 24/39]
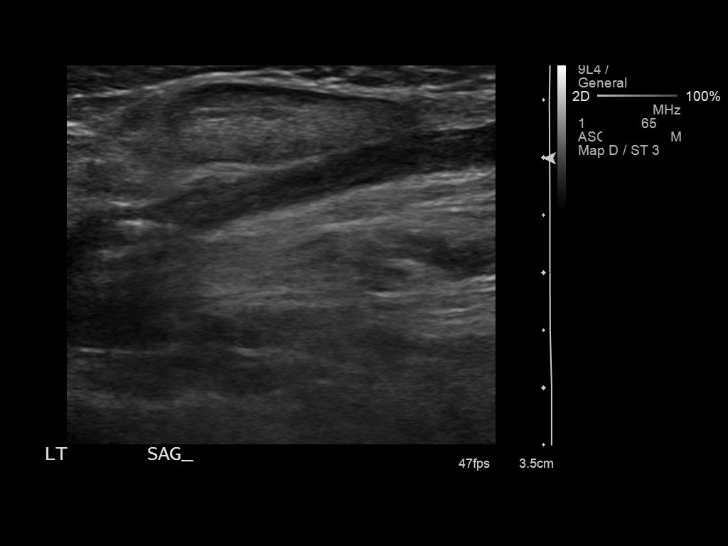
[im 26/39]
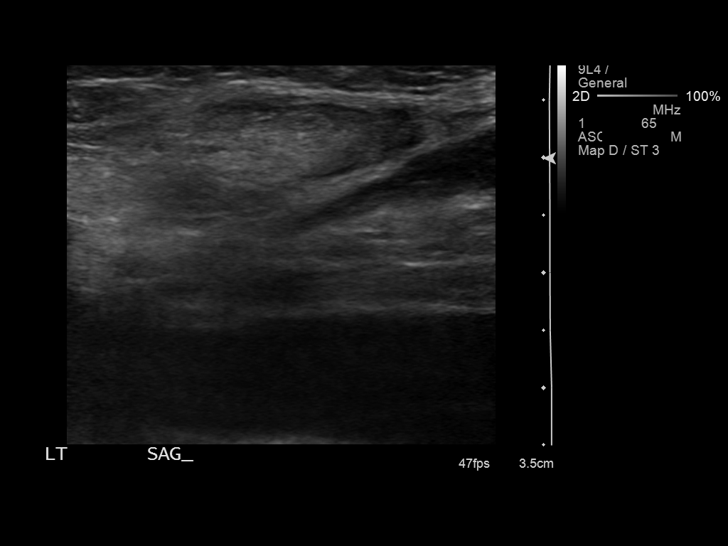
[im 29/39]
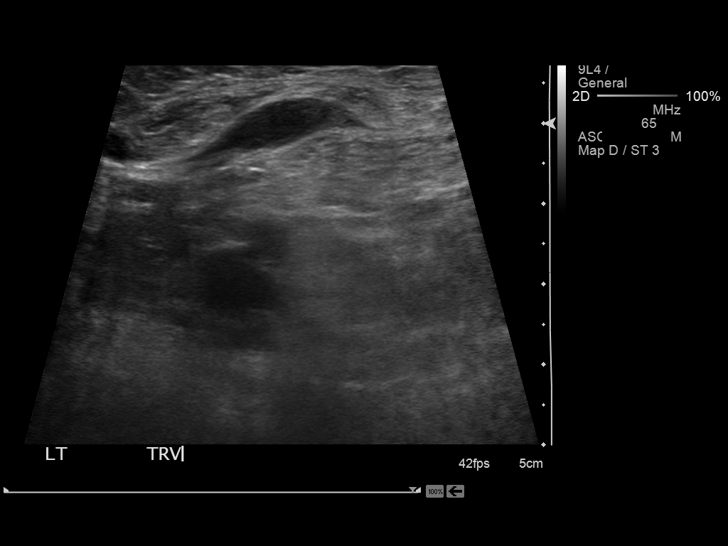
[im 32/39]
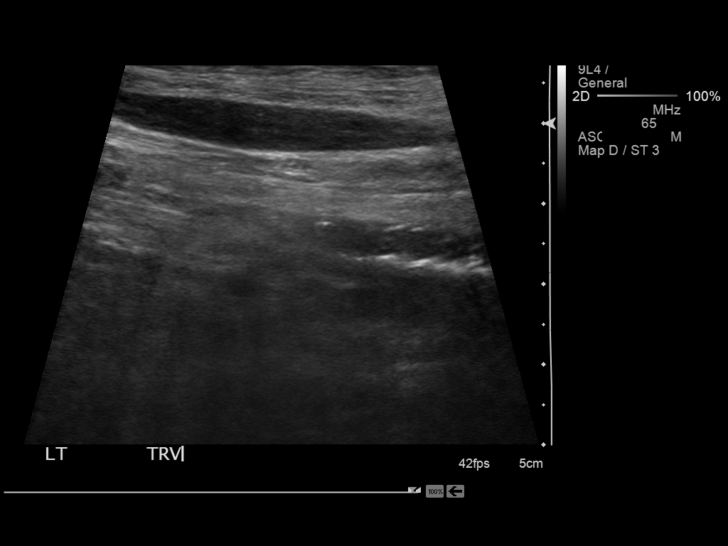
[im 35/39]
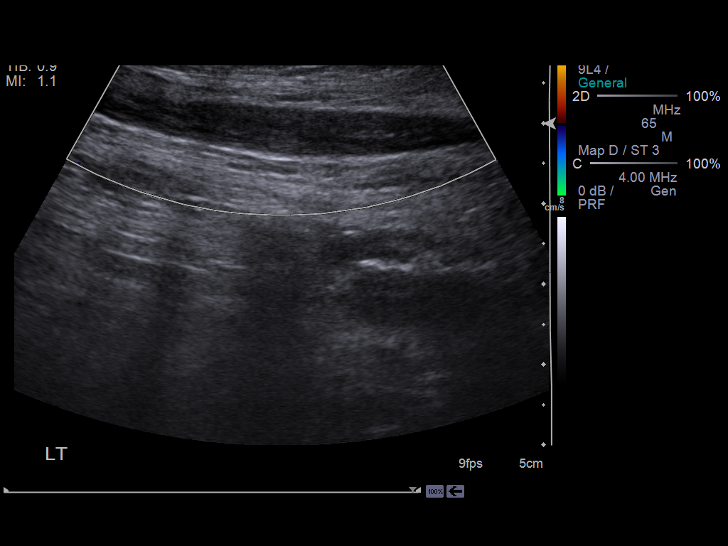
[im 39/39]
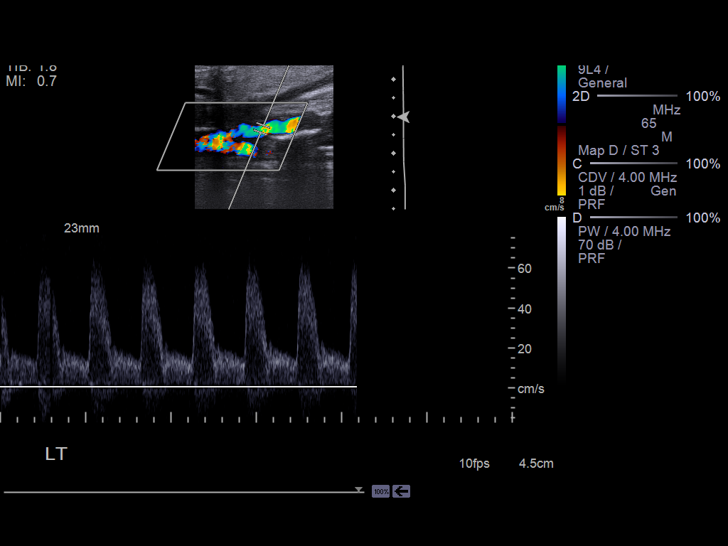

[14 of 25 positions shown; findings below may reference images not displayed]

FINDINGS: Bilateral normal-appearing inguinal lymph nodes
correspond to the questioned abnormalities in both groin regions.
Bilateral fem-pop bypass grafts were identified.  Lymph nodes are
normal in morphology and size.
IMPRESSION: Normal-appearing bilateral inguinal lymph nodes correspond to the
questioned masses in the groin regions bilaterally on the prior
exam.

## 2014-08-05 ENCOUNTER — Encounter (HOSPITAL_COMMUNITY): Payer: Self-pay | Admitting: Surgery

## 2016-05-23 NOTE — Telephone Encounter (Signed)
Error
